# Patient Record
Sex: Female | Born: 1955
Health system: Southern US, Community
[De-identification: ages and names within clinical notes are randomized; demographics above are authoritative.]

## PROBLEM LIST (undated history)

## (undated) DIAGNOSIS — J189 Pneumonia, unspecified organism: Secondary | ICD-10-CM

## (undated) DIAGNOSIS — R059 Cough, unspecified: Secondary | ICD-10-CM

## (undated) DIAGNOSIS — I2510 Atherosclerotic heart disease of native coronary artery without angina pectoris: Secondary | ICD-10-CM

## (undated) DIAGNOSIS — I499 Cardiac arrhythmia, unspecified: Secondary | ICD-10-CM

## (undated) DIAGNOSIS — N390 Urinary tract infection, site not specified: Secondary | ICD-10-CM

## (undated) DIAGNOSIS — R030 Elevated blood-pressure reading, without diagnosis of hypertension: Secondary | ICD-10-CM

## (undated) DIAGNOSIS — I10 Essential (primary) hypertension: Secondary | ICD-10-CM

## (undated) DIAGNOSIS — M545 Low back pain, unspecified: Secondary | ICD-10-CM

## (undated) DIAGNOSIS — M5412 Radiculopathy, cervical region: Secondary | ICD-10-CM

## (undated) DIAGNOSIS — R569 Unspecified convulsions: Secondary | ICD-10-CM

## (undated) DIAGNOSIS — J45909 Unspecified asthma, uncomplicated: Secondary | ICD-10-CM

## (undated) DIAGNOSIS — I2119 ST elevation (STEMI) myocardial infarction involving other coronary artery of inferior wall: Secondary | ICD-10-CM

## (undated) DIAGNOSIS — I209 Angina pectoris, unspecified: Secondary | ICD-10-CM

## (undated) DIAGNOSIS — M359 Systemic involvement of connective tissue, unspecified: Secondary | ICD-10-CM

## (undated) DIAGNOSIS — K449 Diaphragmatic hernia without obstruction or gangrene: Secondary | ICD-10-CM

## (undated) DIAGNOSIS — J439 Emphysema, unspecified: Secondary | ICD-10-CM

## (undated) DIAGNOSIS — C3490 Malignant neoplasm of unspecified part of unspecified bronchus or lung: Secondary | ICD-10-CM

## (undated) DIAGNOSIS — K759 Inflammatory liver disease, unspecified: Secondary | ICD-10-CM

## (undated) DIAGNOSIS — E785 Hyperlipidemia, unspecified: Secondary | ICD-10-CM

## (undated) DIAGNOSIS — F3289 Other specified depressive episodes: Secondary | ICD-10-CM

## (undated) DIAGNOSIS — H269 Unspecified cataract: Secondary | ICD-10-CM

## (undated) DIAGNOSIS — K219 Gastro-esophageal reflux disease without esophagitis: Secondary | ICD-10-CM

## (undated) DIAGNOSIS — F329 Major depressive disorder, single episode, unspecified: Secondary | ICD-10-CM

## (undated) DIAGNOSIS — Z923 Personal history of irradiation: Secondary | ICD-10-CM

## (undated) DIAGNOSIS — A159 Respiratory tuberculosis unspecified: Secondary | ICD-10-CM

## (undated) DIAGNOSIS — M1990 Unspecified osteoarthritis, unspecified site: Secondary | ICD-10-CM

## (undated) DIAGNOSIS — L259 Unspecified contact dermatitis, unspecified cause: Secondary | ICD-10-CM

## (undated) DIAGNOSIS — J449 Chronic obstructive pulmonary disease, unspecified: Secondary | ICD-10-CM

## (undated) DIAGNOSIS — Z5111 Encounter for antineoplastic chemotherapy: Secondary | ICD-10-CM

## (undated) DIAGNOSIS — E559 Vitamin D deficiency, unspecified: Secondary | ICD-10-CM

## (undated) DIAGNOSIS — R05 Cough: Secondary | ICD-10-CM

## (undated) DIAGNOSIS — J4489 Other specified chronic obstructive pulmonary disease: Secondary | ICD-10-CM

## (undated) DIAGNOSIS — G4700 Insomnia, unspecified: Secondary | ICD-10-CM

## (undated) DIAGNOSIS — IMO0001 Reserved for inherently not codable concepts without codable children: Secondary | ICD-10-CM

## (undated) DIAGNOSIS — D239 Other benign neoplasm of skin, unspecified: Secondary | ICD-10-CM

## (undated) DIAGNOSIS — F411 Generalized anxiety disorder: Secondary | ICD-10-CM

## (undated) DIAGNOSIS — N189 Chronic kidney disease, unspecified: Secondary | ICD-10-CM

## (undated) DIAGNOSIS — G2581 Restless legs syndrome: Secondary | ICD-10-CM

## (undated) DIAGNOSIS — Z9861 Coronary angioplasty status: Secondary | ICD-10-CM

## (undated) DIAGNOSIS — Z720 Tobacco use: Secondary | ICD-10-CM

## (undated) DIAGNOSIS — I491 Atrial premature depolarization: Secondary | ICD-10-CM

## (undated) DIAGNOSIS — M25569 Pain in unspecified knee: Secondary | ICD-10-CM

## (undated) HISTORY — DX: Insomnia, unspecified: G47.00

## (undated) HISTORY — PX: TONSILLECTOMY: SUR1361

## (undated) HISTORY — DX: Restless legs syndrome: G25.81

## (undated) HISTORY — DX: Major depressive disorder, single episode, unspecified: F32.9

## (undated) HISTORY — DX: Malignant neoplasm of unspecified part of unspecified bronchus or lung: C34.90

## (undated) HISTORY — DX: Pain in unspecified knee: M25.569

## (undated) HISTORY — DX: Low back pain: M54.5

## (undated) HISTORY — DX: Generalized anxiety disorder: F41.1

## (undated) HISTORY — DX: Other benign neoplasm of skin, unspecified: D23.9

## (undated) HISTORY — PX: TUBAL LIGATION: SHX77

## (undated) HISTORY — PX: KNEE SURGERY: SHX244

## (undated) HISTORY — DX: Unspecified asthma, uncomplicated: J45.909

## (undated) HISTORY — DX: Unspecified osteoarthritis, unspecified site: M19.90

## (undated) HISTORY — DX: Encounter for antineoplastic chemotherapy: Z51.11

## (undated) HISTORY — DX: Urinary tract infection, site not specified: N39.0

## (undated) HISTORY — DX: Other specified chronic obstructive pulmonary disease: J44.89

## (undated) HISTORY — DX: Other specified depressive episodes: F32.89

## (undated) HISTORY — DX: Low back pain, unspecified: M54.50

## (undated) HISTORY — PX: COLONOSCOPY: SHX174

## (undated) HISTORY — DX: Unspecified cataract: H26.9

## (undated) HISTORY — DX: Elevated blood-pressure reading, without diagnosis of hypertension: R03.0

## (undated) HISTORY — DX: Gastro-esophageal reflux disease without esophagitis: K21.9

## (undated) HISTORY — DX: Unspecified contact dermatitis, unspecified cause: L25.9

## (undated) HISTORY — DX: Vitamin D deficiency, unspecified: E55.9

## (undated) HISTORY — PX: BREAST BIOPSY: SHX20

## (undated) HISTORY — DX: Hyperlipidemia, unspecified: E78.5

## (undated) HISTORY — PX: POLYPECTOMY: SHX149

## (undated) HISTORY — DX: Atherosclerotic heart disease of native coronary artery without angina pectoris: I25.10

## (undated) HISTORY — DX: Diaphragmatic hernia without obstruction or gangrene: K44.9

## (undated) HISTORY — DX: Radiculopathy, cervical region: M54.12

## (undated) HISTORY — DX: Chronic obstructive pulmonary disease, unspecified: J44.9

## (undated) HISTORY — DX: Unspecified convulsions: R56.9

## (undated) HISTORY — DX: Atherosclerotic heart disease of native coronary artery without angina pectoris: Z98.61

## (undated) HISTORY — DX: Atrial premature depolarization: I49.1

## (undated) HISTORY — DX: Emphysema, unspecified: J43.9

---
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
## 1997-09-20 ENCOUNTER — Ambulatory Visit: Admission: RE | Admit: 1997-09-20 | Discharge: 1997-09-20 | Payer: Self-pay | Admitting: Internal Medicine

## 1997-09-26 ENCOUNTER — Encounter: Admission: RE | Admit: 1997-09-26 | Discharge: 1997-09-26 | Payer: Self-pay | Admitting: Internal Medicine

## 1997-11-11 ENCOUNTER — Other Ambulatory Visit: Admission: RE | Admit: 1997-11-11 | Discharge: 1997-11-11 | Payer: Self-pay | Admitting: *Deleted

## 1997-11-11 ENCOUNTER — Encounter: Admission: RE | Admit: 1997-11-11 | Discharge: 1997-11-11 | Payer: Self-pay | Admitting: Internal Medicine

## 1997-11-20 ENCOUNTER — Emergency Department (HOSPITAL_COMMUNITY): Admission: EM | Admit: 1997-11-20 | Discharge: 1997-11-20 | Payer: Self-pay | Admitting: Emergency Medicine

## 1998-05-05 ENCOUNTER — Encounter: Payer: Self-pay | Admitting: Emergency Medicine

## 1998-05-05 ENCOUNTER — Emergency Department (HOSPITAL_COMMUNITY): Admission: EM | Admit: 1998-05-05 | Discharge: 1998-05-05 | Payer: Self-pay | Admitting: Emergency Medicine

## 1998-05-12 ENCOUNTER — Encounter: Admission: RE | Admit: 1998-05-12 | Discharge: 1998-05-12 | Payer: Self-pay | Admitting: Internal Medicine

## 1998-06-14 ENCOUNTER — Encounter: Admission: RE | Admit: 1998-06-14 | Discharge: 1998-06-14 | Payer: Self-pay | Admitting: Internal Medicine

## 1998-06-22 ENCOUNTER — Ambulatory Visit (HOSPITAL_COMMUNITY): Admission: RE | Admit: 1998-06-22 | Discharge: 1998-06-22 | Payer: Self-pay | Admitting: *Deleted

## 1998-09-29 ENCOUNTER — Encounter: Admission: RE | Admit: 1998-09-29 | Discharge: 1998-09-29 | Payer: Self-pay | Admitting: Internal Medicine

## 1998-10-01 ENCOUNTER — Emergency Department (HOSPITAL_COMMUNITY): Admission: EM | Admit: 1998-10-01 | Discharge: 1998-10-01 | Payer: Self-pay | Admitting: Emergency Medicine

## 1998-10-23 ENCOUNTER — Encounter: Admission: RE | Admit: 1998-10-23 | Discharge: 1998-10-23 | Payer: Self-pay | Admitting: Internal Medicine

## 1999-01-02 ENCOUNTER — Emergency Department (HOSPITAL_COMMUNITY): Admission: EM | Admit: 1999-01-02 | Discharge: 1999-01-02 | Payer: Self-pay | Admitting: Emergency Medicine

## 1999-02-06 ENCOUNTER — Emergency Department (HOSPITAL_COMMUNITY): Admission: EM | Admit: 1999-02-06 | Discharge: 1999-02-06 | Payer: Self-pay | Admitting: *Deleted

## 1999-04-05 ENCOUNTER — Emergency Department (HOSPITAL_COMMUNITY): Admission: EM | Admit: 1999-04-05 | Discharge: 1999-04-05 | Payer: Self-pay | Admitting: Emergency Medicine

## 1999-10-14 ENCOUNTER — Emergency Department (HOSPITAL_COMMUNITY): Admission: EM | Admit: 1999-10-14 | Discharge: 1999-10-14 | Payer: Self-pay | Admitting: *Deleted

## 1999-10-18 ENCOUNTER — Encounter: Admission: RE | Admit: 1999-10-18 | Discharge: 1999-10-18 | Payer: Self-pay | Admitting: Internal Medicine

## 1999-11-08 ENCOUNTER — Encounter: Admission: RE | Admit: 1999-11-08 | Discharge: 1999-11-08 | Payer: Self-pay | Admitting: Internal Medicine

## 2000-01-27 ENCOUNTER — Encounter: Payer: Self-pay | Admitting: Neurosurgery

## 2000-01-27 ENCOUNTER — Ambulatory Visit (HOSPITAL_COMMUNITY): Admission: RE | Admit: 2000-01-27 | Discharge: 2000-01-27 | Payer: Self-pay | Admitting: Neurosurgery

## 2000-06-11 ENCOUNTER — Encounter: Admission: RE | Admit: 2000-06-11 | Discharge: 2000-06-11 | Payer: Self-pay | Admitting: Hematology and Oncology

## 2000-07-31 ENCOUNTER — Encounter: Admission: RE | Admit: 2000-07-31 | Discharge: 2000-07-31 | Payer: Self-pay | Admitting: Hematology and Oncology

## 2000-08-07 ENCOUNTER — Encounter: Admission: RE | Admit: 2000-08-07 | Discharge: 2000-08-07 | Payer: Self-pay | Admitting: Hematology and Oncology

## 2000-12-12 ENCOUNTER — Encounter: Payer: Self-pay | Admitting: Emergency Medicine

## 2000-12-12 ENCOUNTER — Emergency Department (HOSPITAL_COMMUNITY): Admission: EM | Admit: 2000-12-12 | Discharge: 2000-12-12 | Payer: Self-pay | Admitting: Emergency Medicine

## 2001-01-29 ENCOUNTER — Encounter: Admission: RE | Admit: 2001-01-29 | Discharge: 2001-01-29 | Payer: Self-pay | Admitting: Internal Medicine

## 2001-03-30 ENCOUNTER — Encounter: Admission: RE | Admit: 2001-03-30 | Discharge: 2001-03-30 | Payer: Self-pay | Admitting: Internal Medicine

## 2001-04-18 ENCOUNTER — Encounter: Payer: Self-pay | Admitting: Neurosurgery

## 2001-04-18 ENCOUNTER — Ambulatory Visit (HOSPITAL_COMMUNITY): Admission: RE | Admit: 2001-04-18 | Discharge: 2001-04-18 | Payer: Self-pay | Admitting: Neurosurgery

## 2001-05-12 ENCOUNTER — Other Ambulatory Visit: Admission: RE | Admit: 2001-05-12 | Discharge: 2001-05-12 | Payer: Self-pay | Admitting: Obstetrics & Gynecology

## 2001-05-12 ENCOUNTER — Encounter: Admission: RE | Admit: 2001-05-12 | Discharge: 2001-05-12 | Payer: Self-pay | Admitting: Obstetrics & Gynecology

## 2001-05-13 ENCOUNTER — Other Ambulatory Visit: Admission: RE | Admit: 2001-05-13 | Discharge: 2001-05-13 | Payer: Self-pay | Admitting: Obstetrics & Gynecology

## 2001-05-22 ENCOUNTER — Inpatient Hospital Stay (HOSPITAL_COMMUNITY): Admission: AD | Admit: 2001-05-22 | Discharge: 2001-05-22 | Payer: Self-pay | Admitting: Obstetrics & Gynecology

## 2001-05-22 ENCOUNTER — Ambulatory Visit (HOSPITAL_COMMUNITY): Admission: RE | Admit: 2001-05-22 | Discharge: 2001-05-22 | Payer: Self-pay | Admitting: Obstetrics

## 2001-05-22 ENCOUNTER — Encounter: Payer: Self-pay | Admitting: Obstetrics

## 2001-06-07 ENCOUNTER — Emergency Department (HOSPITAL_COMMUNITY): Admission: EM | Admit: 2001-06-07 | Discharge: 2001-06-07 | Payer: Self-pay | Admitting: Emergency Medicine

## 2001-06-11 ENCOUNTER — Emergency Department (HOSPITAL_COMMUNITY): Admission: EM | Admit: 2001-06-11 | Discharge: 2001-06-11 | Payer: Self-pay | Admitting: Emergency Medicine

## 2001-06-17 ENCOUNTER — Encounter: Payer: Self-pay | Admitting: Emergency Medicine

## 2001-06-17 ENCOUNTER — Emergency Department (HOSPITAL_COMMUNITY): Admission: EM | Admit: 2001-06-17 | Discharge: 2001-06-17 | Payer: Self-pay | Admitting: Emergency Medicine

## 2001-07-29 ENCOUNTER — Encounter: Admission: RE | Admit: 2001-07-29 | Discharge: 2001-09-24 | Payer: Self-pay | Admitting: Neurosurgery

## 2001-08-19 ENCOUNTER — Encounter: Admission: RE | Admit: 2001-08-19 | Discharge: 2001-08-19 | Payer: Self-pay | Admitting: *Deleted

## 2001-09-19 ENCOUNTER — Encounter: Payer: Self-pay | Admitting: Emergency Medicine

## 2001-09-19 ENCOUNTER — Emergency Department (HOSPITAL_COMMUNITY): Admission: EM | Admit: 2001-09-19 | Discharge: 2001-09-19 | Payer: Self-pay | Admitting: Emergency Medicine

## 2001-12-03 ENCOUNTER — Encounter: Admission: RE | Admit: 2001-12-03 | Discharge: 2001-12-03 | Payer: Self-pay | Admitting: Internal Medicine

## 2002-03-07 ENCOUNTER — Emergency Department (HOSPITAL_COMMUNITY): Admission: EM | Admit: 2002-03-07 | Discharge: 2002-03-07 | Payer: Self-pay | Admitting: Emergency Medicine

## 2002-03-07 ENCOUNTER — Encounter: Payer: Self-pay | Admitting: Emergency Medicine

## 2002-05-01 ENCOUNTER — Emergency Department (HOSPITAL_COMMUNITY): Admission: EM | Admit: 2002-05-01 | Discharge: 2002-05-01 | Payer: Self-pay | Admitting: Emergency Medicine

## 2002-05-12 ENCOUNTER — Encounter: Admission: RE | Admit: 2002-05-12 | Discharge: 2002-05-12 | Payer: Self-pay | Admitting: Internal Medicine

## 2002-06-16 ENCOUNTER — Encounter (INDEPENDENT_AMBULATORY_CARE_PROVIDER_SITE_OTHER): Payer: Self-pay | Admitting: *Deleted

## 2002-06-16 ENCOUNTER — Other Ambulatory Visit: Admission: RE | Admit: 2002-06-16 | Discharge: 2002-06-16 | Payer: Self-pay | Admitting: Internal Medicine

## 2002-06-16 ENCOUNTER — Encounter: Admission: RE | Admit: 2002-06-16 | Discharge: 2002-06-16 | Payer: Self-pay | Admitting: Internal Medicine

## 2002-06-18 ENCOUNTER — Ambulatory Visit (HOSPITAL_COMMUNITY): Admission: RE | Admit: 2002-06-18 | Discharge: 2002-06-18 | Payer: Self-pay

## 2002-06-25 ENCOUNTER — Encounter: Admission: RE | Admit: 2002-06-25 | Discharge: 2002-06-25 | Payer: Self-pay | Admitting: Sports Medicine

## 2002-06-25 ENCOUNTER — Encounter (INDEPENDENT_AMBULATORY_CARE_PROVIDER_SITE_OTHER): Payer: Self-pay | Admitting: Specialist

## 2002-06-25 ENCOUNTER — Encounter: Payer: Self-pay | Admitting: Sports Medicine

## 2002-09-24 ENCOUNTER — Encounter: Admission: RE | Admit: 2002-09-24 | Discharge: 2002-09-24 | Payer: Self-pay | Admitting: Internal Medicine

## 2002-11-01 ENCOUNTER — Encounter: Admission: RE | Admit: 2002-11-01 | Discharge: 2002-11-01 | Payer: Self-pay | Admitting: Internal Medicine

## 2003-02-10 ENCOUNTER — Emergency Department (HOSPITAL_COMMUNITY): Admission: EM | Admit: 2003-02-10 | Discharge: 2003-02-11 | Payer: Self-pay

## 2003-04-06 ENCOUNTER — Encounter: Admission: RE | Admit: 2003-04-06 | Discharge: 2003-04-06 | Payer: Self-pay | Admitting: Internal Medicine

## 2003-08-18 ENCOUNTER — Encounter: Admission: RE | Admit: 2003-08-18 | Discharge: 2003-08-18 | Payer: Self-pay | Admitting: Internal Medicine

## 2003-08-19 ENCOUNTER — Encounter: Admission: RE | Admit: 2003-08-19 | Discharge: 2003-08-19 | Payer: Self-pay | Admitting: Internal Medicine

## 2003-09-13 ENCOUNTER — Emergency Department (HOSPITAL_COMMUNITY): Admission: EM | Admit: 2003-09-13 | Discharge: 2003-09-13 | Payer: Self-pay | Admitting: Family Medicine

## 2004-01-18 ENCOUNTER — Encounter: Admission: RE | Admit: 2004-01-18 | Discharge: 2004-01-18 | Payer: Self-pay | Admitting: Internal Medicine

## 2004-01-23 ENCOUNTER — Ambulatory Visit (HOSPITAL_COMMUNITY): Admission: RE | Admit: 2004-01-23 | Discharge: 2004-01-23 | Payer: Self-pay | Admitting: Internal Medicine

## 2004-01-25 ENCOUNTER — Encounter: Admission: RE | Admit: 2004-01-25 | Discharge: 2004-01-25 | Payer: Self-pay | Admitting: Internal Medicine

## 2004-03-05 ENCOUNTER — Emergency Department (HOSPITAL_COMMUNITY): Admission: EM | Admit: 2004-03-05 | Discharge: 2004-03-05 | Payer: Self-pay | Admitting: Family Medicine

## 2004-03-10 ENCOUNTER — Emergency Department (HOSPITAL_COMMUNITY): Admission: EM | Admit: 2004-03-10 | Discharge: 2004-03-10 | Payer: Self-pay | Admitting: Emergency Medicine

## 2004-04-09 ENCOUNTER — Ambulatory Visit: Payer: Self-pay | Admitting: Internal Medicine

## 2004-05-03 ENCOUNTER — Emergency Department (HOSPITAL_COMMUNITY): Admission: EM | Admit: 2004-05-03 | Discharge: 2004-05-03 | Payer: Self-pay | Admitting: Family Medicine

## 2004-05-12 ENCOUNTER — Emergency Department (HOSPITAL_COMMUNITY): Admission: EM | Admit: 2004-05-12 | Discharge: 2004-05-12 | Payer: Self-pay | Admitting: Family Medicine

## 2004-08-30 ENCOUNTER — Ambulatory Visit: Payer: Self-pay | Admitting: Internal Medicine

## 2004-11-05 ENCOUNTER — Ambulatory Visit: Payer: Self-pay | Admitting: Internal Medicine

## 2004-12-12 ENCOUNTER — Ambulatory Visit: Payer: Self-pay | Admitting: Internal Medicine

## 2005-01-07 ENCOUNTER — Emergency Department (HOSPITAL_COMMUNITY): Admission: EM | Admit: 2005-01-07 | Discharge: 2005-01-07 | Payer: Self-pay | Admitting: Family Medicine

## 2005-02-28 ENCOUNTER — Encounter (INDEPENDENT_AMBULATORY_CARE_PROVIDER_SITE_OTHER): Payer: Self-pay | Admitting: Internal Medicine

## 2005-02-28 ENCOUNTER — Ambulatory Visit: Payer: Self-pay | Admitting: Internal Medicine

## 2005-02-28 LAB — CONVERTED CEMR LAB: Pap Smear: ABNORMAL

## 2005-03-11 ENCOUNTER — Emergency Department (HOSPITAL_COMMUNITY): Admission: EM | Admit: 2005-03-11 | Discharge: 2005-03-11 | Payer: Self-pay | Admitting: Emergency Medicine

## 2005-03-11 ENCOUNTER — Ambulatory Visit: Payer: Self-pay | Admitting: Hospitalist

## 2005-03-21 ENCOUNTER — Encounter: Admission: RE | Admit: 2005-03-21 | Discharge: 2005-03-21 | Payer: Self-pay | Admitting: Internal Medicine

## 2005-04-04 ENCOUNTER — Ambulatory Visit (HOSPITAL_COMMUNITY): Admission: RE | Admit: 2005-04-04 | Discharge: 2005-04-04 | Payer: Self-pay | Admitting: Internal Medicine

## 2005-04-04 ENCOUNTER — Ambulatory Visit: Payer: Self-pay | Admitting: Internal Medicine

## 2005-04-15 ENCOUNTER — Ambulatory Visit (HOSPITAL_COMMUNITY): Admission: RE | Admit: 2005-04-15 | Discharge: 2005-04-15 | Payer: Self-pay | Admitting: Internal Medicine

## 2005-06-22 ENCOUNTER — Inpatient Hospital Stay (HOSPITAL_COMMUNITY): Admission: EM | Admit: 2005-06-22 | Discharge: 2005-06-23 | Payer: Self-pay | Admitting: Emergency Medicine

## 2005-07-01 ENCOUNTER — Ambulatory Visit: Payer: Self-pay | Admitting: Internal Medicine

## 2005-08-16 ENCOUNTER — Ambulatory Visit: Payer: Self-pay

## 2005-09-05 ENCOUNTER — Ambulatory Visit: Payer: Self-pay | Admitting: Internal Medicine

## 2005-09-05 ENCOUNTER — Ambulatory Visit (HOSPITAL_COMMUNITY): Admission: RE | Admit: 2005-09-05 | Discharge: 2005-09-05 | Payer: Self-pay | Admitting: Hospitalist

## 2005-10-01 ENCOUNTER — Encounter: Admission: RE | Admit: 2005-10-01 | Discharge: 2005-12-30 | Payer: Self-pay | Admitting: Podiatry

## 2005-10-03 ENCOUNTER — Ambulatory Visit: Payer: Self-pay | Admitting: Internal Medicine

## 2005-10-07 ENCOUNTER — Emergency Department (HOSPITAL_COMMUNITY): Admission: EM | Admit: 2005-10-07 | Discharge: 2005-10-07 | Payer: Self-pay | Admitting: Family Medicine

## 2005-10-17 ENCOUNTER — Ambulatory Visit (HOSPITAL_COMMUNITY): Admission: RE | Admit: 2005-10-17 | Discharge: 2005-10-17 | Payer: Self-pay | Admitting: Internal Medicine

## 2005-10-17 ENCOUNTER — Ambulatory Visit: Payer: Self-pay | Admitting: Internal Medicine

## 2005-10-29 ENCOUNTER — Ambulatory Visit: Payer: Self-pay | Admitting: Internal Medicine

## 2005-11-20 ENCOUNTER — Emergency Department (HOSPITAL_COMMUNITY): Admission: EM | Admit: 2005-11-20 | Discharge: 2005-11-20 | Payer: Self-pay | Admitting: Family Medicine

## 2005-12-17 ENCOUNTER — Ambulatory Visit: Payer: Self-pay | Admitting: Internal Medicine

## 2006-01-08 ENCOUNTER — Ambulatory Visit: Payer: Self-pay | Admitting: Internal Medicine

## 2006-01-31 ENCOUNTER — Ambulatory Visit: Payer: Self-pay | Admitting: Internal Medicine

## 2006-02-01 ENCOUNTER — Ambulatory Visit (HOSPITAL_COMMUNITY): Admission: RE | Admit: 2006-02-01 | Discharge: 2006-02-01 | Payer: Self-pay | Admitting: Internal Medicine

## 2006-02-18 ENCOUNTER — Ambulatory Visit: Payer: Self-pay | Admitting: Hospitalist

## 2006-02-25 ENCOUNTER — Ambulatory Visit: Payer: Self-pay | Admitting: Internal Medicine

## 2006-02-25 LAB — CONVERTED CEMR LAB: Pap Smear: NEGATIVE

## 2006-03-12 ENCOUNTER — Emergency Department (HOSPITAL_COMMUNITY): Admission: EM | Admit: 2006-03-12 | Discharge: 2006-03-12 | Payer: Self-pay | Admitting: Emergency Medicine

## 2006-05-22 IMAGING — US US ABDOMEN COMPLETE
1 series · 14 of 25 positions shown · non-contrast
Comparison: none

CLINICAL DATA: Abdominal pain. 
 ABDOMINAL ULTRASOUND 
 Numerous images made in the longitudinal and transverse direction reveal the gallbladder to be normal in size with a wall thickness of 1.6 mm.  No stones are noted.  The common bile duct measures 3.6 mm without evidence of stones.  No abnormality is noted of the liver, inferior vena cava or pancreas.  The spleen is though to be normal in size.   The right kidney measures 11.2 cm and the left 11.6 without evidence of hydronephrosis or stone.  The abdominal aorta is within normal limits measuring 2.1 cm in maximum diameter. 
 IMPRESSION
 No abnormality noted on abdominal ultrasound exam.  Specifically, no evidence of gallstones is suggested.

[Series 1: unknown · 0.30mm/px · 14 of 54 slices shown]
[im 1/54]
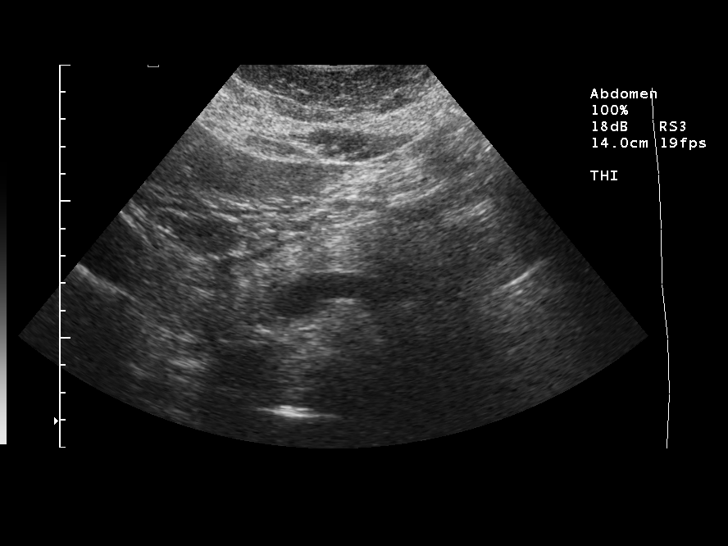
[im 5/54]
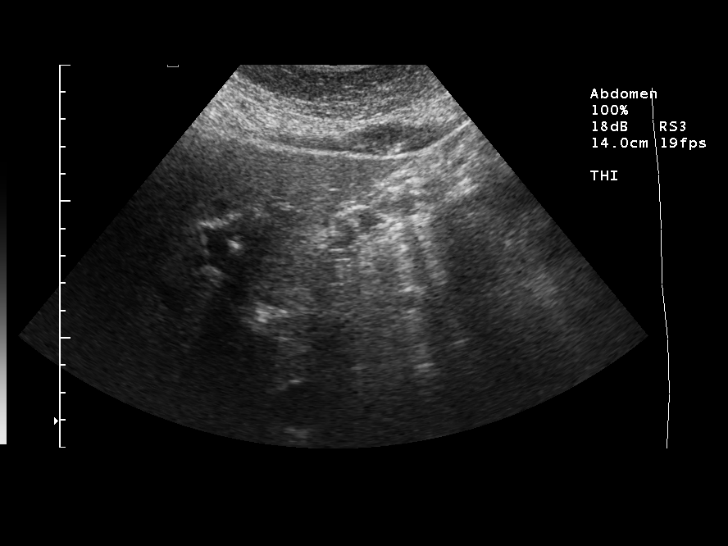
[im 9/54]
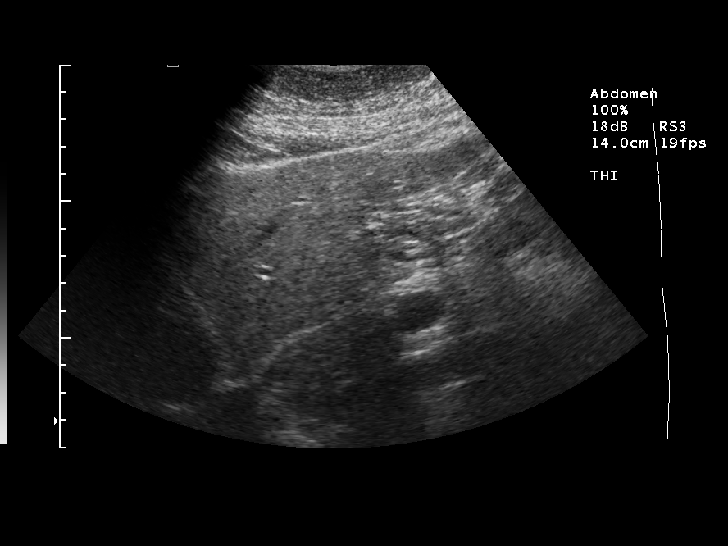
[im 14/54]
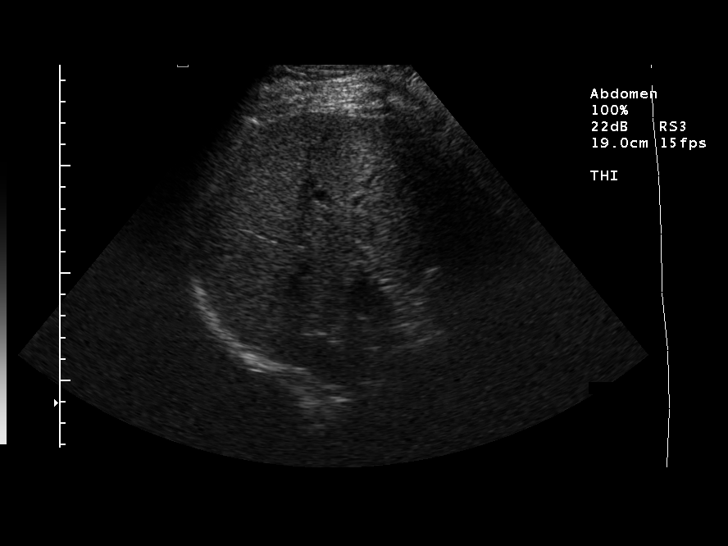
[im 18/54]
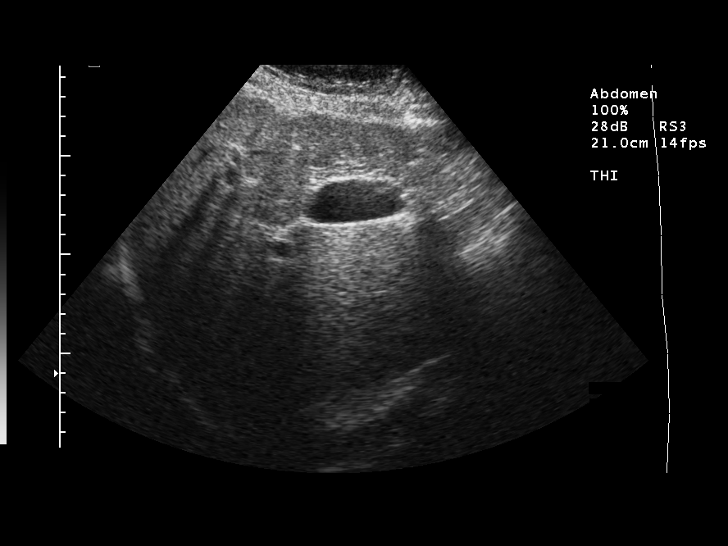
[im 20/54]
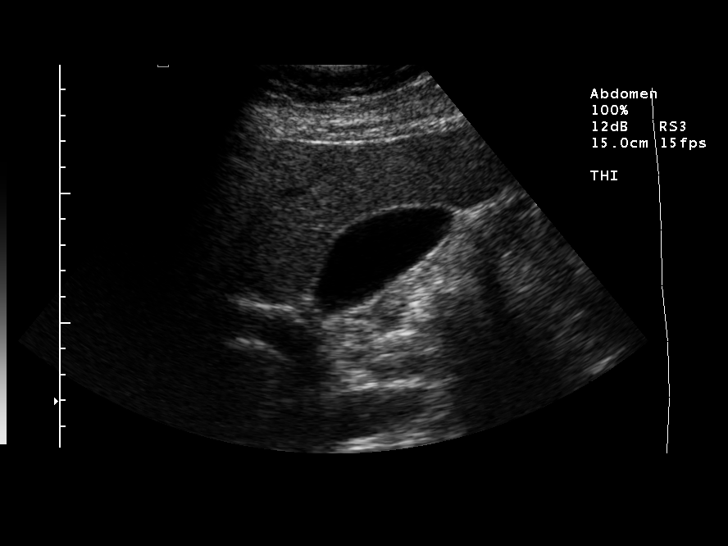
[im 25/54]
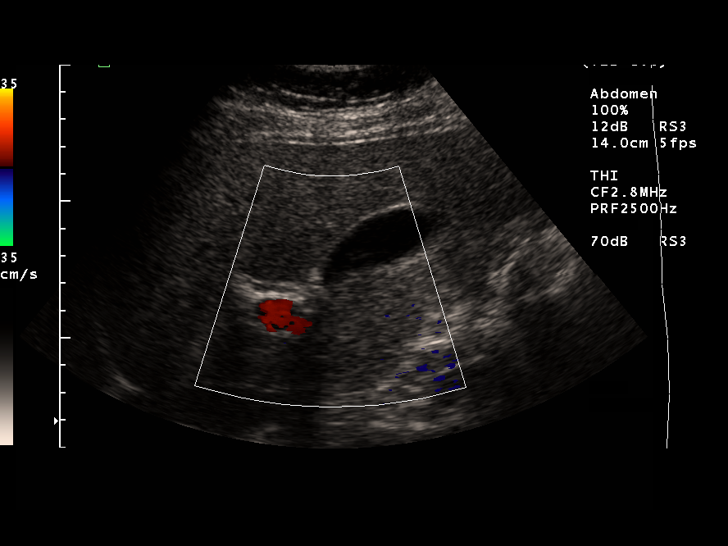
[im 29/54]
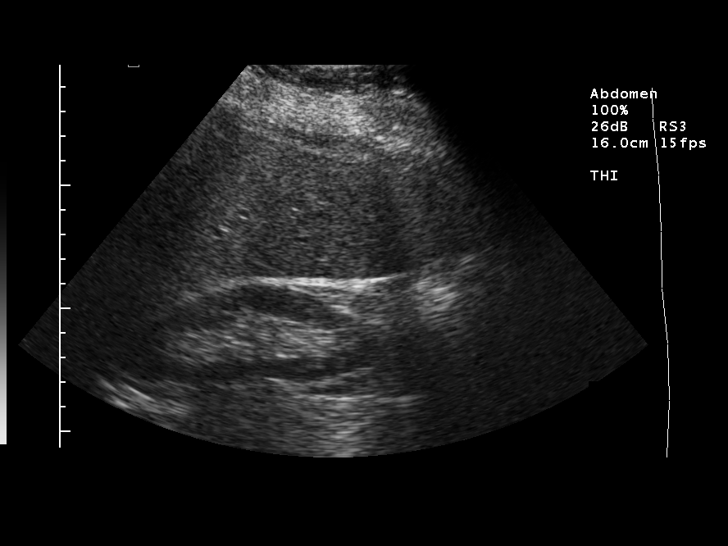
[im 34/54]
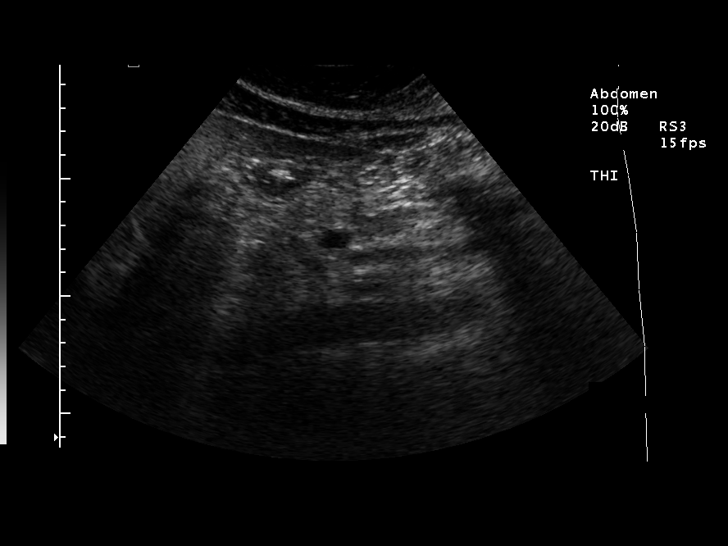
[im 36/54]
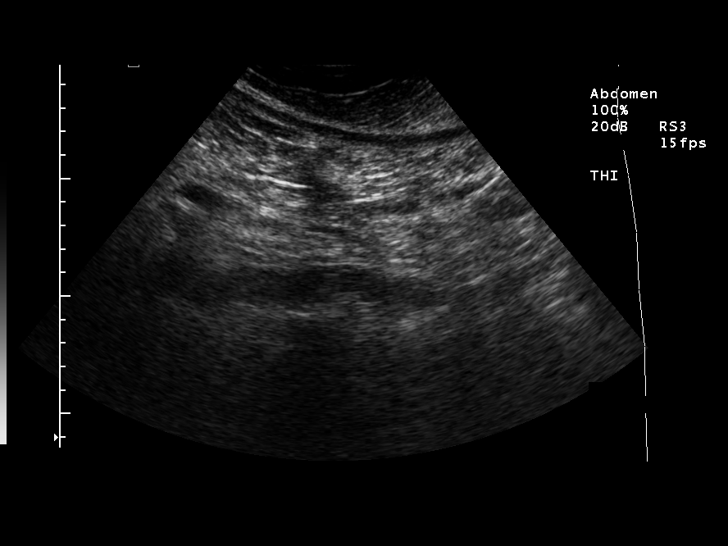
[im 40/54]
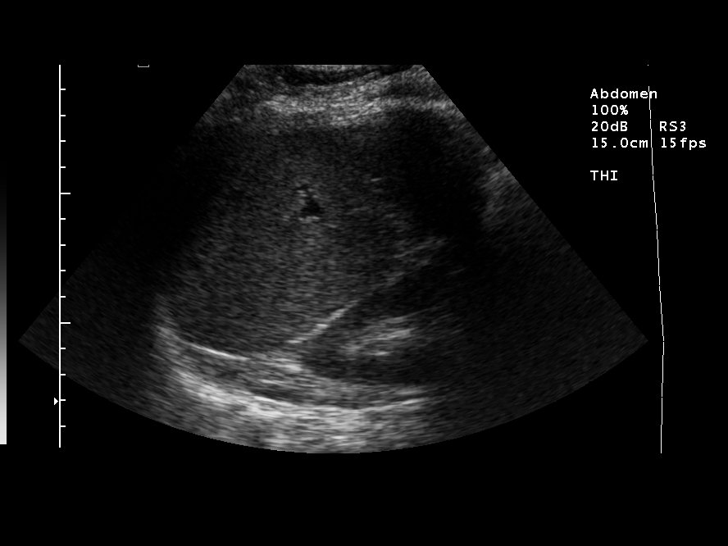
[im 45/54]
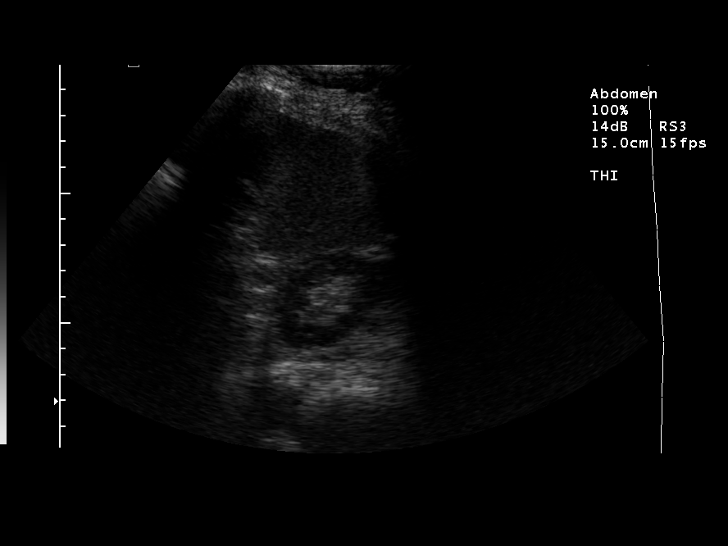
[im 49/54]
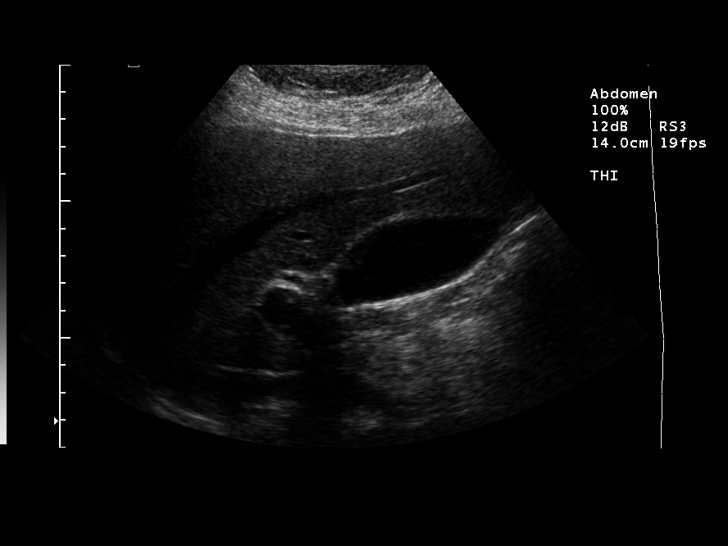
[im 54/54]
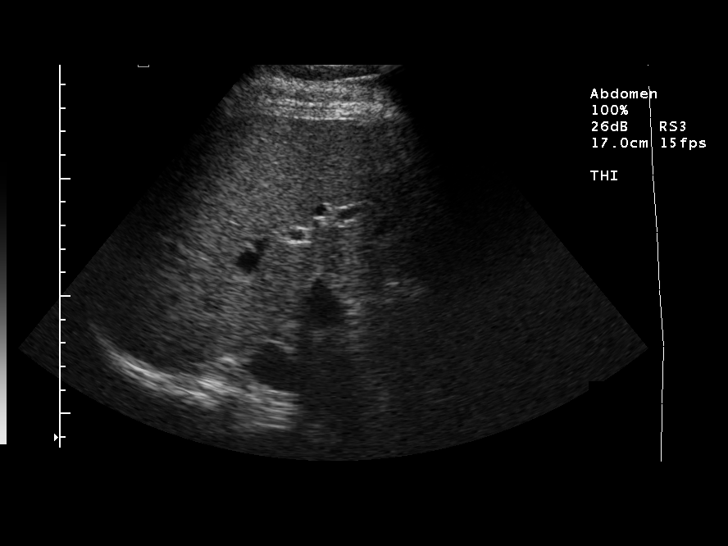

[14 of 25 positions shown; findings below may reference images not displayed]

## 2006-05-23 ENCOUNTER — Encounter (INDEPENDENT_AMBULATORY_CARE_PROVIDER_SITE_OTHER): Payer: Self-pay | Admitting: *Deleted

## 2006-05-23 ENCOUNTER — Ambulatory Visit (HOSPITAL_COMMUNITY): Admission: RE | Admit: 2006-05-23 | Discharge: 2006-05-23 | Payer: Self-pay | Admitting: Internal Medicine

## 2006-05-23 ENCOUNTER — Ambulatory Visit: Payer: Self-pay | Admitting: Internal Medicine

## 2006-05-23 LAB — CONVERTED CEMR LAB
ALT: 18 units/L (ref 0–35)
AST: 19 units/L (ref 0–37)
Albumin: 4.1 g/dL (ref 3.5–5.2)
Alkaline Phosphatase: 70 units/L (ref 39–117)
BUN: 7 mg/dL (ref 6–23)
CK-MB: 0.6 ng/mL (ref 0.3–4.0)
CO2: 27 meq/L (ref 19–32)
Calcium: 9.4 mg/dL (ref 8.4–10.5)
Chloride: 104 meq/L (ref 96–112)
Creatinine, Ser: 0.7 mg/dL (ref 0.40–1.20)
Glucose, Bld: 88 mg/dL (ref 70–99)
HCT: 41.6 % (ref 34.4–43.3)
Hemoglobin: 14.2 g/dL (ref 11.7–14.8)
Lipase: 31 units/L (ref 11–59)
MCHC: 34.2 g/dL (ref 33.1–35.4)
MCV: 86.7 fL (ref 78.8–100.0)
Platelets: 232 10*3/uL (ref 152–374)
Potassium: 4.3 meq/L (ref 3.5–5.3)
RBC: 4.8 M/uL (ref 3.79–4.96)
RDW: 13.2 % (ref 11.5–15.3)
Sodium: 138 meq/L (ref 135–145)
Total Bilirubin: 0.3 mg/dL (ref 0.3–1.2)
Total CK: 24 units/L (ref 7–177)
Total Protein: 6.7 g/dL (ref 6.0–8.3)
Troponin I: 0.01 ng/mL (ref <0.06)
WBC: 7.1 10*3/uL (ref 3.7–10.0)

## 2006-05-26 ENCOUNTER — Ambulatory Visit (HOSPITAL_COMMUNITY): Admission: RE | Admit: 2006-05-26 | Discharge: 2006-05-26 | Payer: Self-pay | Admitting: *Deleted

## 2006-06-10 ENCOUNTER — Ambulatory Visit: Payer: Self-pay | Admitting: Gastroenterology

## 2006-06-11 ENCOUNTER — Encounter (INDEPENDENT_AMBULATORY_CARE_PROVIDER_SITE_OTHER): Payer: Self-pay | Admitting: Internal Medicine

## 2006-06-11 DIAGNOSIS — M25569 Pain in unspecified knee: Secondary | ICD-10-CM | POA: Insufficient documentation

## 2006-06-11 DIAGNOSIS — Z8719 Personal history of other diseases of the digestive system: Secondary | ICD-10-CM | POA: Insufficient documentation

## 2006-06-11 DIAGNOSIS — M545 Low back pain, unspecified: Secondary | ICD-10-CM | POA: Insufficient documentation

## 2006-06-11 DIAGNOSIS — F329 Major depressive disorder, single episode, unspecified: Secondary | ICD-10-CM

## 2006-06-11 DIAGNOSIS — K219 Gastro-esophageal reflux disease without esophagitis: Secondary | ICD-10-CM | POA: Insufficient documentation

## 2006-06-11 DIAGNOSIS — F419 Anxiety disorder, unspecified: Secondary | ICD-10-CM | POA: Insufficient documentation

## 2006-06-11 DIAGNOSIS — F32A Depression, unspecified: Secondary | ICD-10-CM | POA: Insufficient documentation

## 2006-06-11 DIAGNOSIS — D239 Other benign neoplasm of skin, unspecified: Secondary | ICD-10-CM | POA: Insufficient documentation

## 2006-06-11 DIAGNOSIS — R569 Unspecified convulsions: Secondary | ICD-10-CM | POA: Insufficient documentation

## 2006-06-11 DIAGNOSIS — G2581 Restless legs syndrome: Secondary | ICD-10-CM | POA: Insufficient documentation

## 2006-06-11 DIAGNOSIS — M79609 Pain in unspecified limb: Secondary | ICD-10-CM | POA: Insufficient documentation

## 2006-06-11 DIAGNOSIS — F411 Generalized anxiety disorder: Secondary | ICD-10-CM | POA: Insufficient documentation

## 2006-06-11 DIAGNOSIS — M5412 Radiculopathy, cervical region: Secondary | ICD-10-CM | POA: Insufficient documentation

## 2006-06-17 ENCOUNTER — Telehealth (INDEPENDENT_AMBULATORY_CARE_PROVIDER_SITE_OTHER): Payer: Self-pay | Admitting: *Deleted

## 2006-07-08 ENCOUNTER — Encounter (INDEPENDENT_AMBULATORY_CARE_PROVIDER_SITE_OTHER): Payer: Self-pay | Admitting: Unknown Physician Specialty

## 2006-07-08 ENCOUNTER — Ambulatory Visit: Payer: Self-pay | Admitting: Gastroenterology

## 2006-07-08 LAB — HM COLONOSCOPY

## 2006-07-08 IMAGING — CR DG LUMBAR SPINE COMPLETE 4+V
5 series · 5 of 5 positions shown · non-contrast
Comparison: none

CLINICAL DATA: Low back pain.

LUMBAR SPINE - 4 VIEW

[view not recorded (1 of 5)]
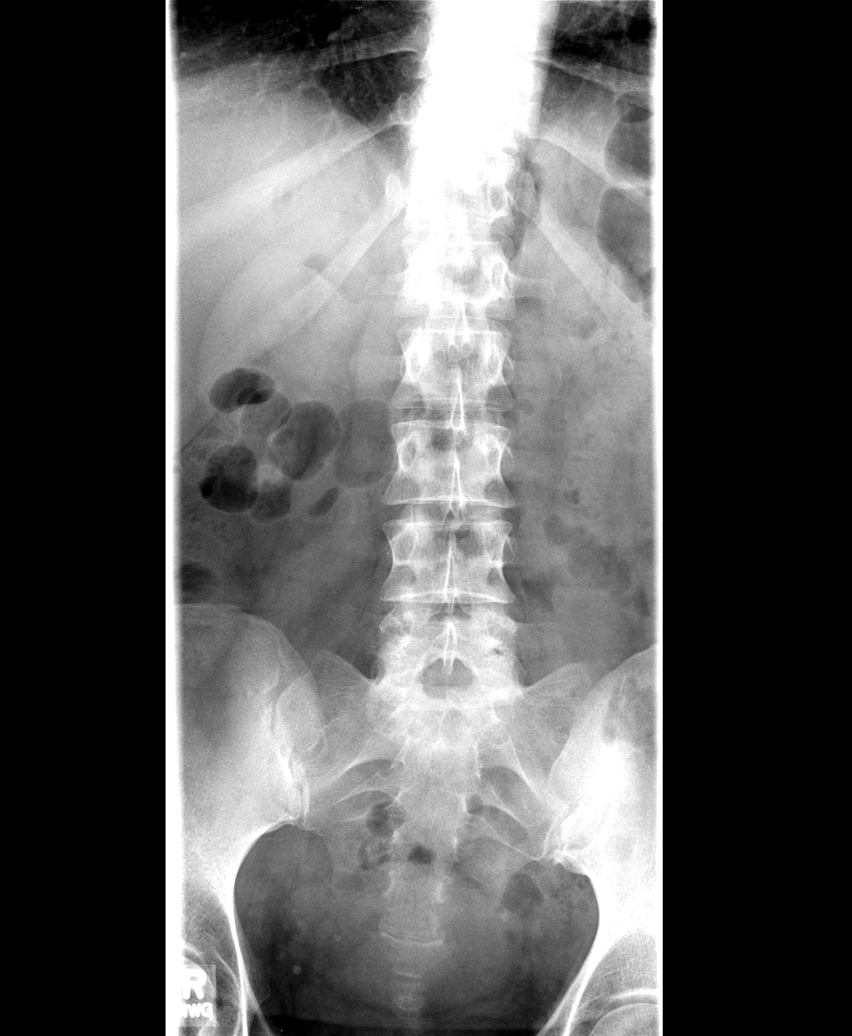

[view not recorded (2 of 5)]
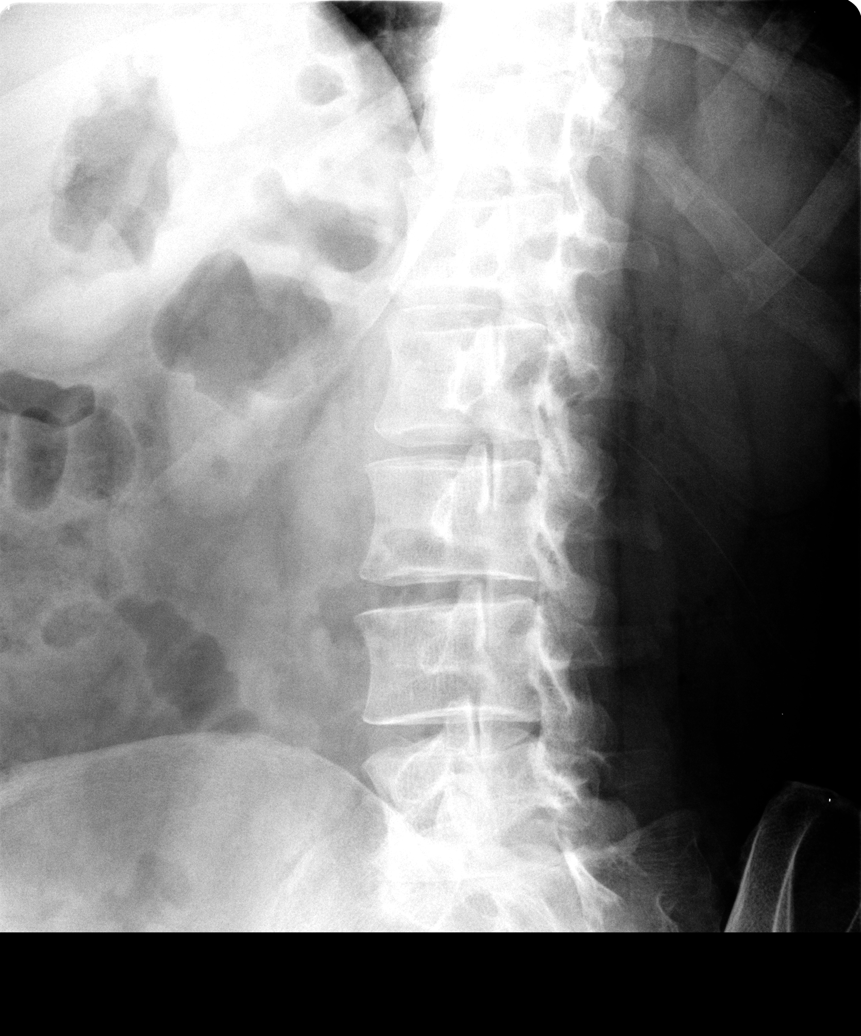

[view not recorded (3 of 5)]
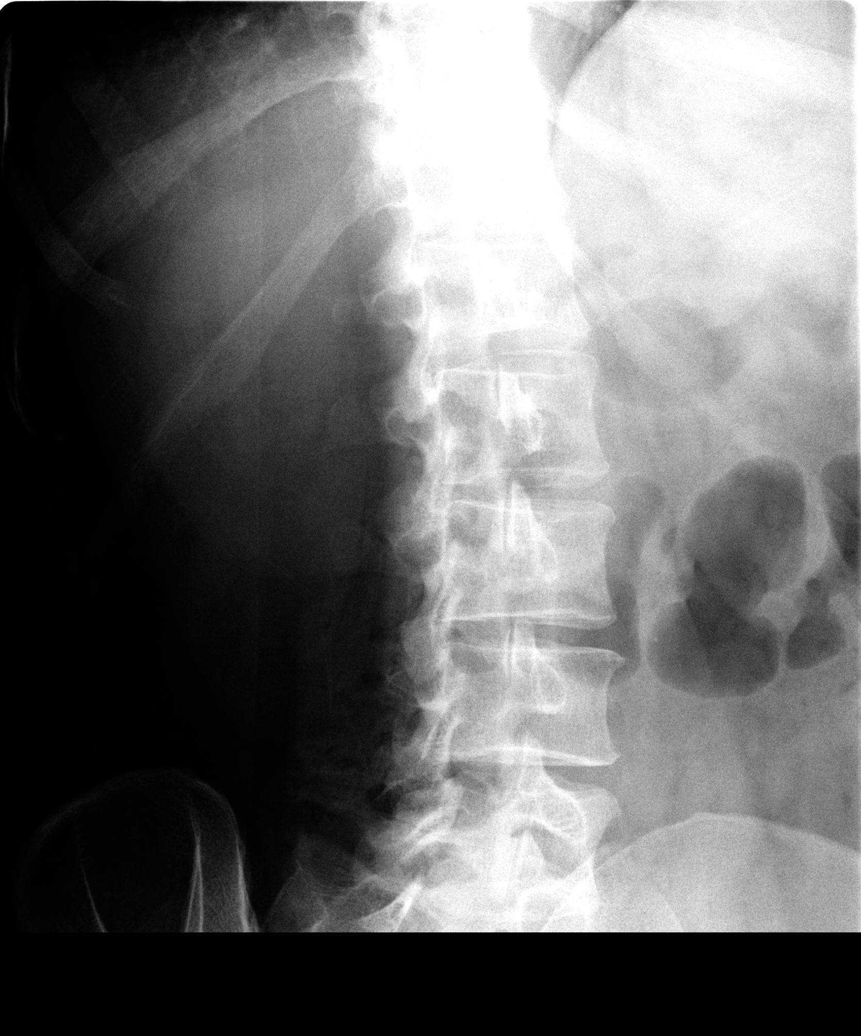

[view not recorded (4 of 5)]
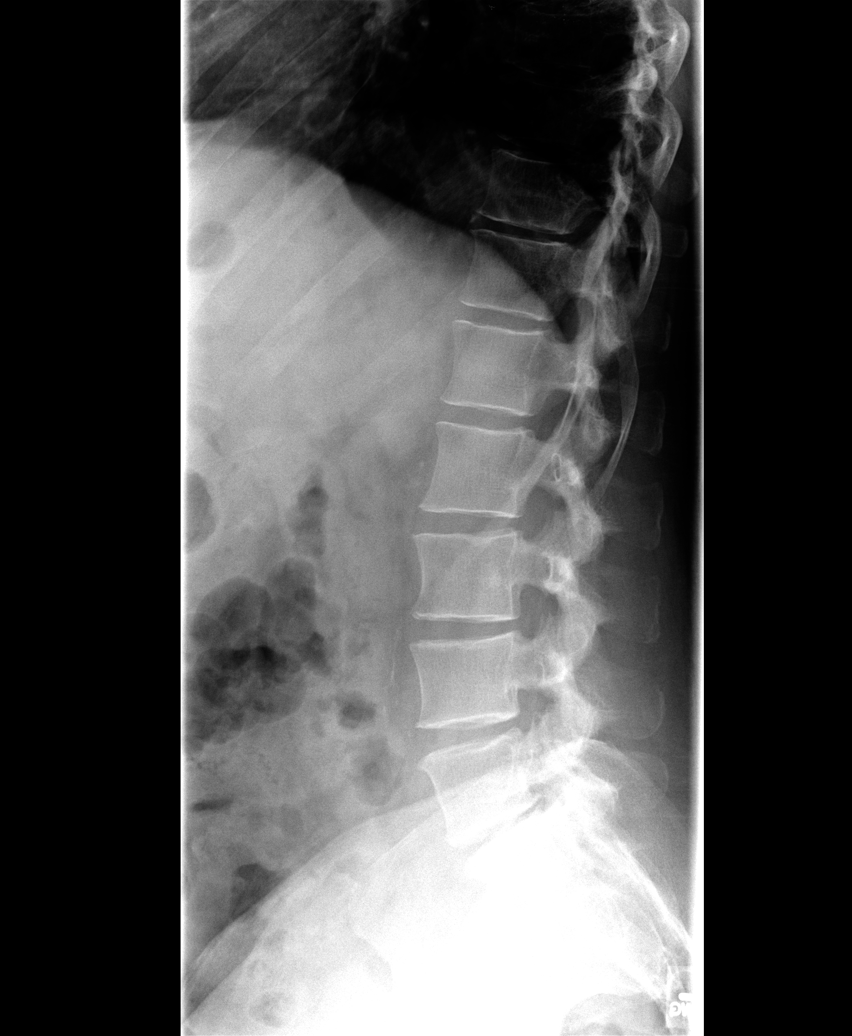

[view not recorded (5 of 5)]
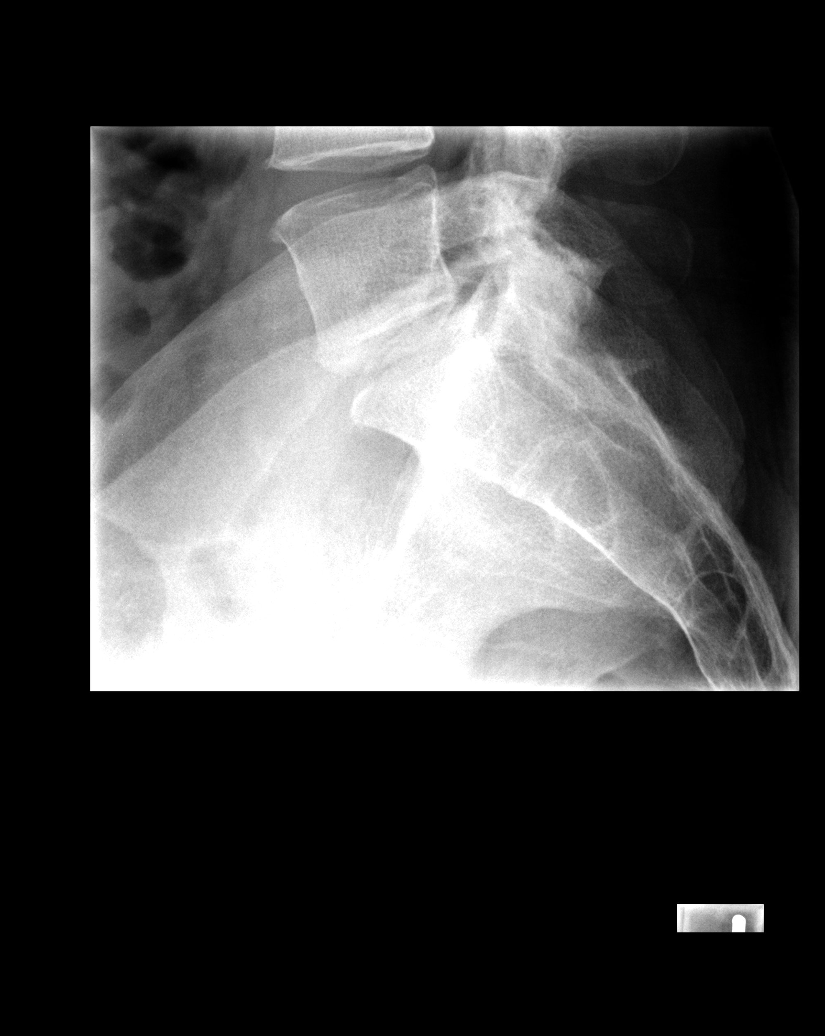

[5 of 5 positions shown; findings below may reference images not displayed]

FINDINGS: 5 nonrib-bearing lumbar vertebrae. Mild dextro-convex thoracolumbar scoliosis. Mild
anterior spur formation at multiple levels and lower lumbar spine facet degenerative changes. No
pars defects or subluxations.

IMPRESSION

Mild scoliosis and mild degenerative changes, as described above.

## 2006-07-16 ENCOUNTER — Encounter: Admission: RE | Admit: 2006-07-16 | Discharge: 2006-07-16 | Payer: Self-pay | Admitting: Sports Medicine

## 2006-08-04 ENCOUNTER — Ambulatory Visit: Payer: Self-pay | Admitting: *Deleted

## 2006-08-04 ENCOUNTER — Encounter (INDEPENDENT_AMBULATORY_CARE_PROVIDER_SITE_OTHER): Payer: Self-pay | Admitting: Unknown Physician Specialty

## 2006-08-08 ENCOUNTER — Telehealth (INDEPENDENT_AMBULATORY_CARE_PROVIDER_SITE_OTHER): Payer: Self-pay | Admitting: Pharmacy Technician

## 2006-08-11 ENCOUNTER — Telehealth: Payer: Self-pay | Admitting: *Deleted

## 2006-08-20 ENCOUNTER — Ambulatory Visit: Payer: Self-pay | Admitting: Gastroenterology

## 2006-08-28 ENCOUNTER — Encounter (INDEPENDENT_AMBULATORY_CARE_PROVIDER_SITE_OTHER): Payer: Self-pay | Admitting: Unknown Physician Specialty

## 2006-09-15 ENCOUNTER — Encounter (INDEPENDENT_AMBULATORY_CARE_PROVIDER_SITE_OTHER): Payer: Self-pay | Admitting: Unknown Physician Specialty

## 2006-09-15 ENCOUNTER — Ambulatory Visit: Payer: Self-pay | Admitting: Hospitalist

## 2006-10-23 ENCOUNTER — Ambulatory Visit: Payer: Self-pay | Admitting: Internal Medicine

## 2006-11-05 ENCOUNTER — Telehealth (INDEPENDENT_AMBULATORY_CARE_PROVIDER_SITE_OTHER): Payer: Self-pay | Admitting: Internal Medicine

## 2006-11-27 ENCOUNTER — Ambulatory Visit: Payer: Self-pay | Admitting: Internal Medicine

## 2006-11-27 ENCOUNTER — Encounter (INDEPENDENT_AMBULATORY_CARE_PROVIDER_SITE_OTHER): Payer: Self-pay | Admitting: Unknown Physician Specialty

## 2006-12-23 ENCOUNTER — Ambulatory Visit: Payer: Self-pay | Admitting: Internal Medicine

## 2006-12-23 DIAGNOSIS — J441 Chronic obstructive pulmonary disease with (acute) exacerbation: Secondary | ICD-10-CM | POA: Insufficient documentation

## 2006-12-23 DIAGNOSIS — J449 Chronic obstructive pulmonary disease, unspecified: Secondary | ICD-10-CM | POA: Insufficient documentation

## 2007-03-06 ENCOUNTER — Encounter: Payer: Self-pay | Admitting: *Deleted

## 2007-03-10 ENCOUNTER — Ambulatory Visit: Payer: Self-pay | Admitting: Hospitalist

## 2007-03-15 ENCOUNTER — Emergency Department (HOSPITAL_COMMUNITY): Admission: EM | Admit: 2007-03-15 | Discharge: 2007-03-15 | Payer: Self-pay | Admitting: Family Medicine

## 2007-03-31 ENCOUNTER — Encounter (INDEPENDENT_AMBULATORY_CARE_PROVIDER_SITE_OTHER): Payer: Self-pay | Admitting: Internal Medicine

## 2007-03-31 ENCOUNTER — Ambulatory Visit: Payer: Self-pay | Admitting: Internal Medicine

## 2007-03-31 DIAGNOSIS — IMO0001 Reserved for inherently not codable concepts without codable children: Secondary | ICD-10-CM | POA: Insufficient documentation

## 2007-03-31 LAB — CONVERTED CEMR LAB
ALT: 16 units/L (ref 0–35)
AST: 15 units/L (ref 0–37)
Albumin: 4.5 g/dL (ref 3.5–5.2)
Alkaline Phosphatase: 80 units/L (ref 39–117)
BUN: 12 mg/dL (ref 6–23)
CO2: 24 meq/L (ref 19–32)
Calcium: 9.6 mg/dL (ref 8.4–10.5)
Candida species: NEGATIVE
Chlamydia, DNA Probe: NEGATIVE
Chloride: 104 meq/L (ref 96–112)
Cholesterol: 225 mg/dL — ABNORMAL HIGH (ref 0–200)
Creatinine, Ser: 0.69 mg/dL (ref 0.40–1.20)
GC Probe Amp, Genital: NEGATIVE
Gardnerella vaginalis: NEGATIVE
Glucose, Bld: 82 mg/dL (ref 70–99)
HCT: 43.3 % (ref 36.0–46.0)
HDL: 36 mg/dL — ABNORMAL LOW (ref >39)
Hemoglobin: 14.6 g/dL (ref 12.0–15.0)
LDL Cholesterol: 143 mg/dL — ABNORMAL HIGH (ref 0–99)
MCHC: 33.7 g/dL (ref 30.0–36.0)
MCV: 88.2 fL (ref 78.0–100.0)
Pap Smear: NORMAL
Platelets: 234 10*3/uL (ref 150–400)
Potassium: 4.1 meq/L (ref 3.5–5.3)
RBC: 4.91 M/uL (ref 3.87–5.11)
RDW: 13.5 % (ref 11.5–14.0)
Sed Rate: 13 mm/hr (ref 0–22)
Sodium: 141 meq/L (ref 135–145)
TSH: 1.536 microintl units/mL (ref 0.350–5.50)
Total Bilirubin: 0.3 mg/dL (ref 0.3–1.2)
Total CHOL/HDL Ratio: 6.3
Total CK: 21 units/L (ref 7–177)
Total Protein: 6.9 g/dL (ref 6.0–8.3)
Trichomonal Vaginitis: NEGATIVE
Triglycerides: 230 mg/dL — ABNORMAL HIGH (ref <150)
VLDL: 46 mg/dL — ABNORMAL HIGH (ref 0–40)
WBC: 6.8 10*3/uL (ref 4.0–10.5)

## 2007-04-04 ENCOUNTER — Emergency Department (HOSPITAL_COMMUNITY): Admission: EM | Admit: 2007-04-04 | Discharge: 2007-04-05 | Payer: Self-pay | Admitting: Emergency Medicine

## 2007-04-06 ENCOUNTER — Encounter (INDEPENDENT_AMBULATORY_CARE_PROVIDER_SITE_OTHER): Payer: Self-pay | Admitting: Internal Medicine

## 2007-04-07 ENCOUNTER — Encounter (INDEPENDENT_AMBULATORY_CARE_PROVIDER_SITE_OTHER): Payer: Self-pay | Admitting: Internal Medicine

## 2007-04-10 ENCOUNTER — Ambulatory Visit (HOSPITAL_COMMUNITY): Admission: RE | Admit: 2007-04-10 | Discharge: 2007-04-10 | Payer: Self-pay | Admitting: Internal Medicine

## 2007-04-14 ENCOUNTER — Telehealth (INDEPENDENT_AMBULATORY_CARE_PROVIDER_SITE_OTHER): Payer: Self-pay | Admitting: Pharmacy Technician

## 2007-04-24 ENCOUNTER — Ambulatory Visit: Payer: Self-pay | Admitting: Internal Medicine
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
## 2007-06-18 ENCOUNTER — Ambulatory Visit: Payer: Self-pay | Admitting: Internal Medicine

## 2007-06-18 ENCOUNTER — Telehealth (INDEPENDENT_AMBULATORY_CARE_PROVIDER_SITE_OTHER): Payer: Self-pay | Admitting: *Deleted

## 2007-06-18 ENCOUNTER — Encounter (INDEPENDENT_AMBULATORY_CARE_PROVIDER_SITE_OTHER): Payer: Self-pay | Admitting: Internal Medicine

## 2007-06-18 DIAGNOSIS — M898X9 Other specified disorders of bone, unspecified site: Secondary | ICD-10-CM | POA: Insufficient documentation

## 2007-06-18 LAB — CONVERTED CEMR LAB
Blood in Urine, dipstick: NEGATIVE
Glucose, Urine, Semiquant: NEGATIVE
Ketones, urine, test strip: NEGATIVE
Nitrite: NEGATIVE
Protein, U semiquant: NEGATIVE
Specific Gravity, Urine: >=1.03
Urobilinogen, UA: 0.2
WBC Urine, dipstick: NEGATIVE
pH: 5

## 2007-06-24 ENCOUNTER — Encounter (INDEPENDENT_AMBULATORY_CARE_PROVIDER_SITE_OTHER): Payer: Self-pay | Admitting: Internal Medicine

## 2007-06-24 ENCOUNTER — Ambulatory Visit (HOSPITAL_COMMUNITY): Admission: RE | Admit: 2007-06-24 | Discharge: 2007-06-24 | Payer: Self-pay | Admitting: Internal Medicine

## 2007-08-02 IMAGING — CR DG LUMBAR SPINE COMPLETE 4+V
5 series · 5 of 5 positions shown · non-contrast
Comparison: 03/10/04.

CLINICAL DATA: Chronic back pain.  
 LUMBAR SPINE ? 5 VIEWS:

[t l-spine a.p.]
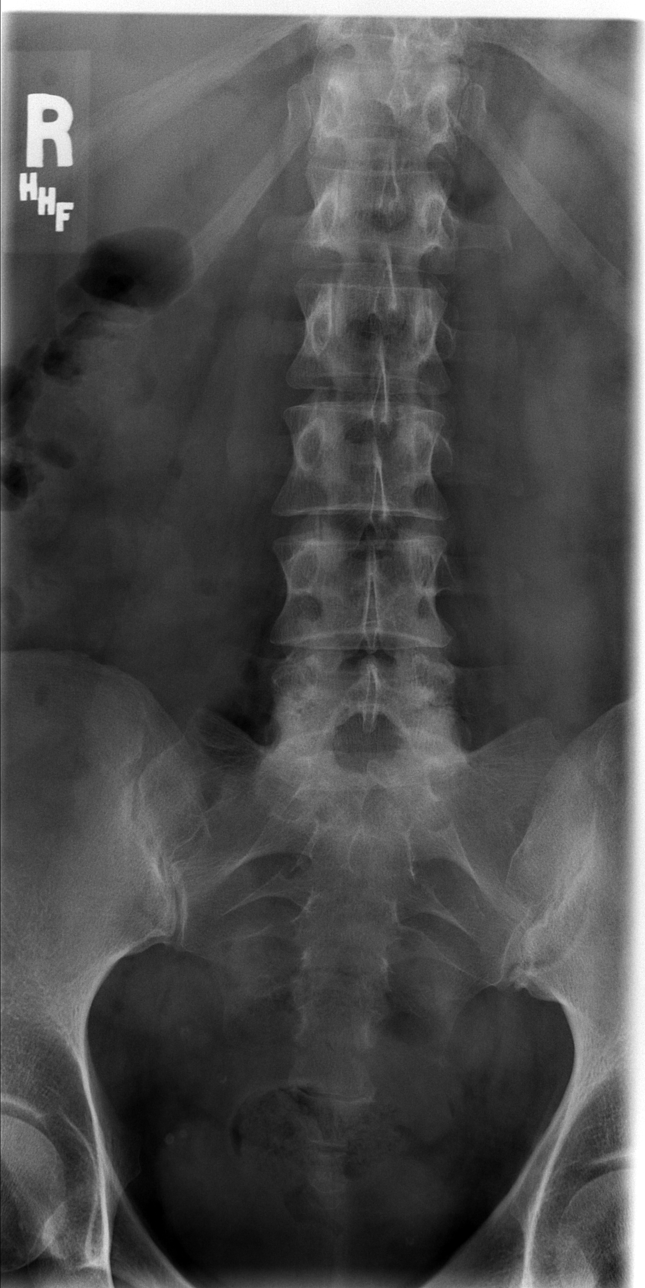

[t l-spine oblique exposure (1 of 2)]
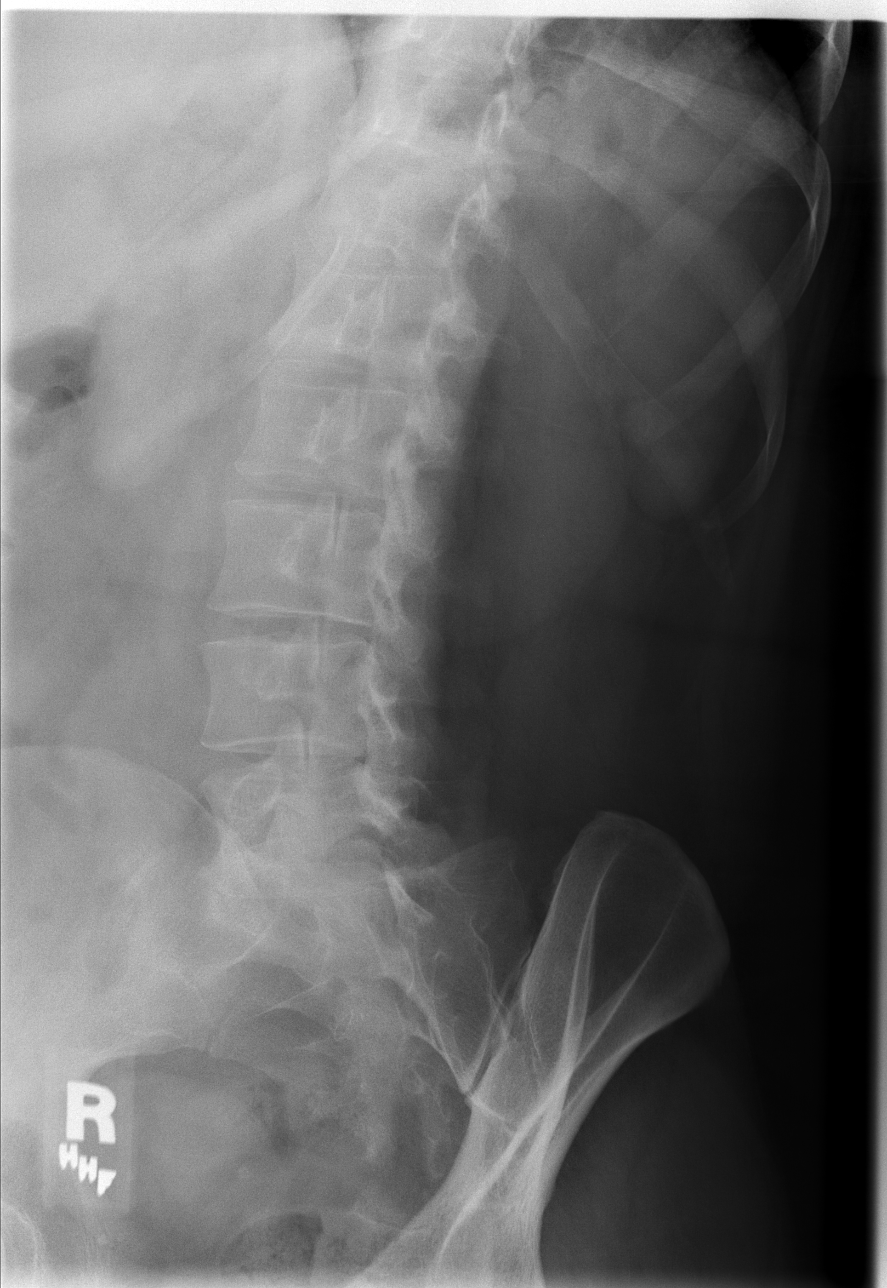

[t l-spine oblique exposure (2 of 2)]
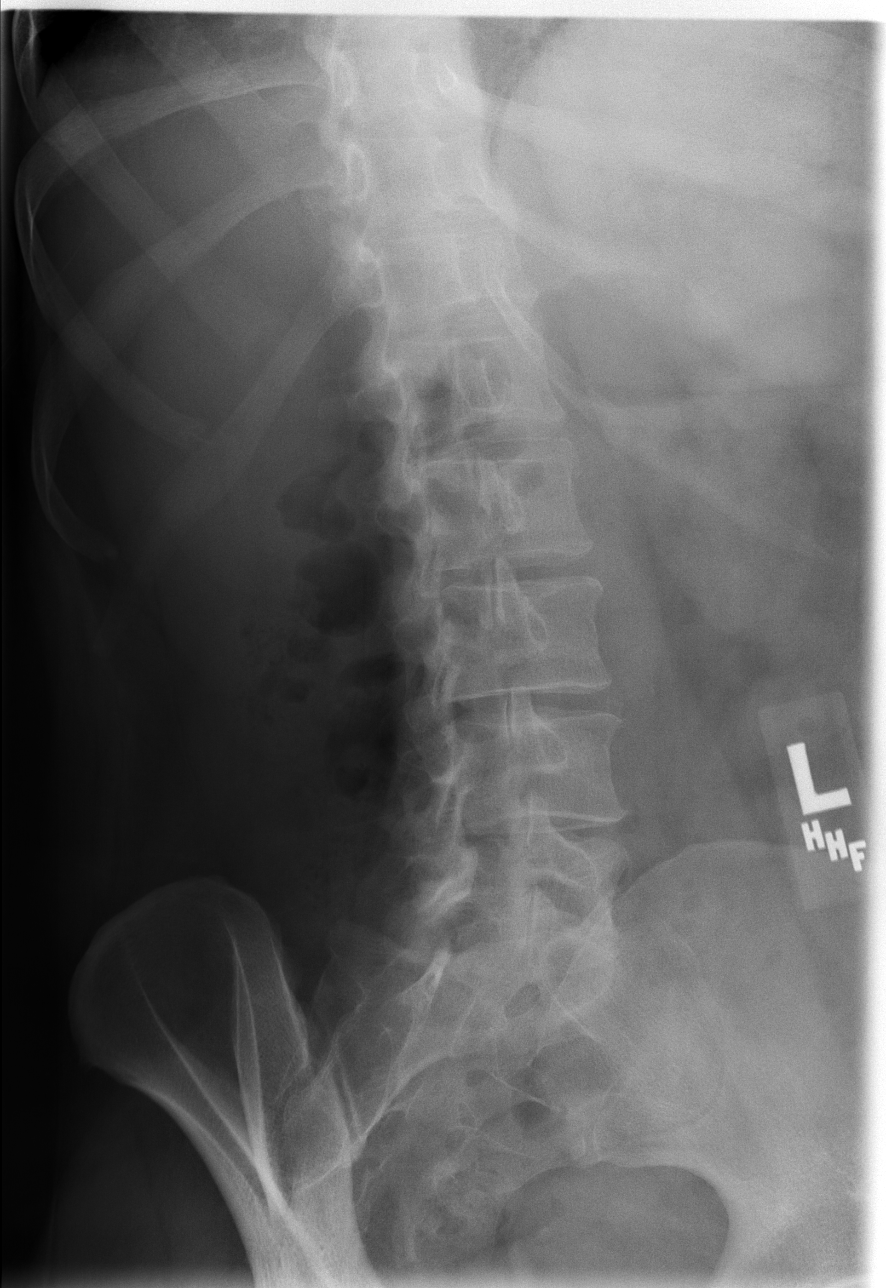

[t l-spine lat]
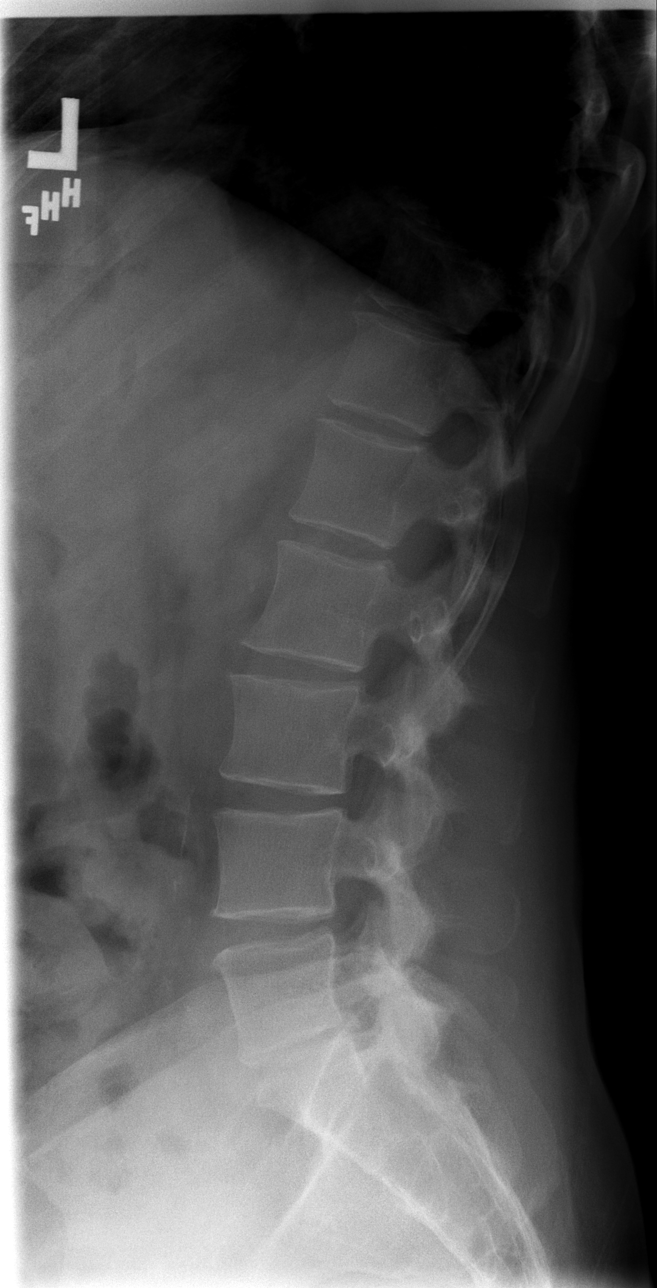

[t l-spine l5-s1 spot]
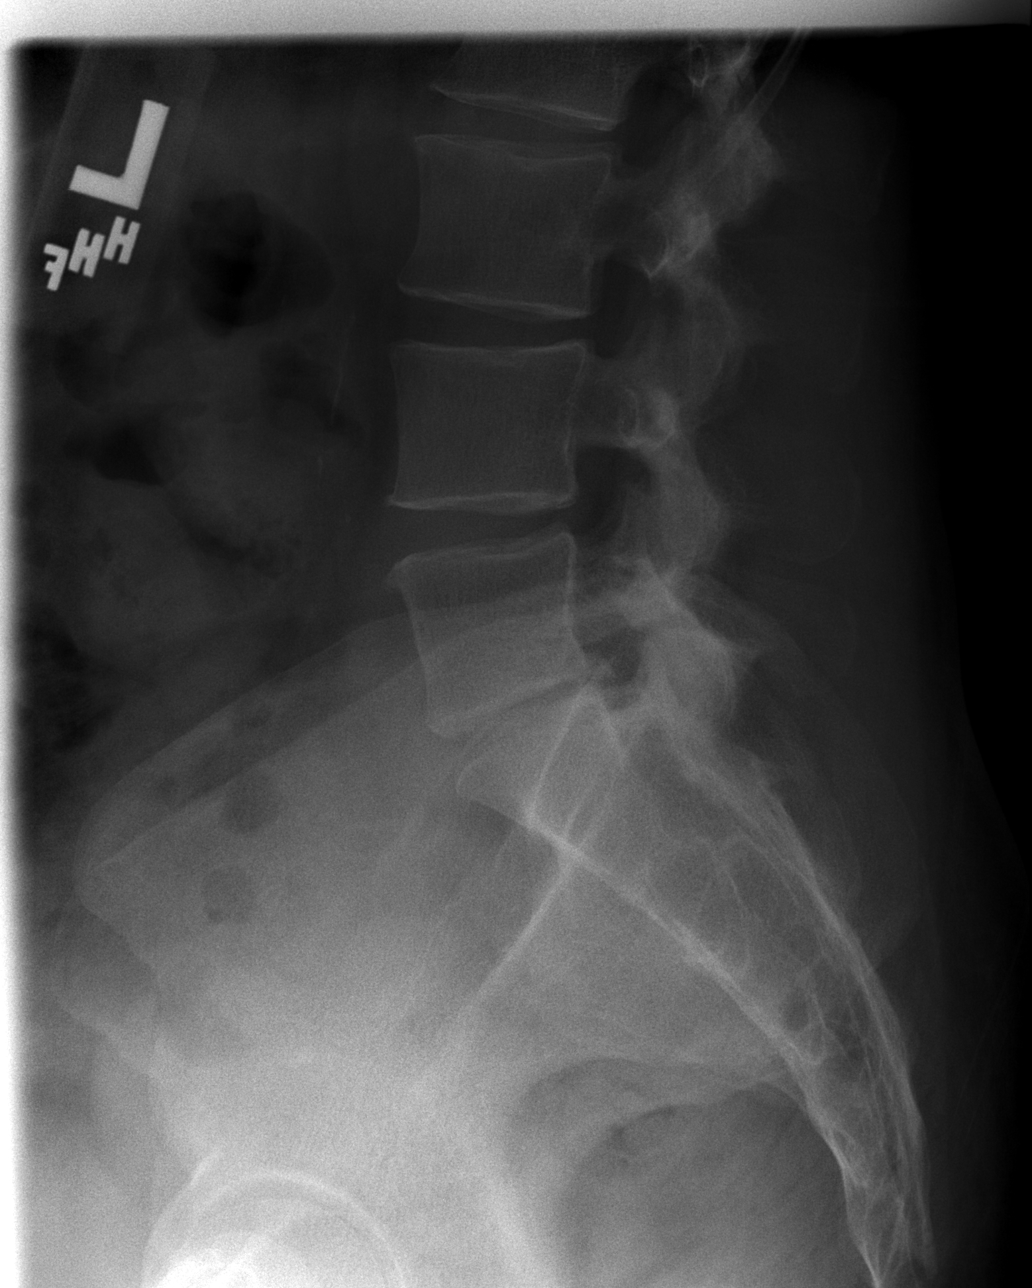

[5 of 5 positions shown; findings below may reference images not displayed]

FINDINGS: The lateral view demonstrates maintenance of vertebral body height and alignment.  Degenerative disc disease at the lumbosacral junction with loss of intervertebral disc height and end plate osteophyte formation.  Less marked findings are seen at the L4-5 level.  
 Oblique images demonstrate no evidence of pars defect.  Minimal convex right curvature in the upper lumbar spine.  Visualized portions of the pelvis and lower ribs are within normal limits.  Atherosclerosis in the abdominal aorta.
IMPRESSION: Similar appearance of lower lumbar degenerative disc disease and mild spondylosis as described.

## 2007-08-05 ENCOUNTER — Ambulatory Visit: Payer: Self-pay | Admitting: Obstetrics & Gynecology

## 2007-08-05 ENCOUNTER — Observation Stay (HOSPITAL_COMMUNITY): Admission: EM | Admit: 2007-08-05 | Discharge: 2007-08-06 | Payer: Self-pay | Admitting: Emergency Medicine

## 2007-08-05 ENCOUNTER — Ambulatory Visit: Payer: Self-pay | Admitting: Cardiovascular Disease

## 2007-08-05 ENCOUNTER — Ambulatory Visit: Payer: Self-pay | Admitting: Internal Medicine

## 2007-08-06 ENCOUNTER — Encounter (INDEPENDENT_AMBULATORY_CARE_PROVIDER_SITE_OTHER): Payer: Self-pay | Admitting: Internal Medicine

## 2007-08-10 ENCOUNTER — Encounter (INDEPENDENT_AMBULATORY_CARE_PROVIDER_SITE_OTHER): Payer: Self-pay | Admitting: Internal Medicine

## 2007-08-10 ENCOUNTER — Encounter: Admission: RE | Admit: 2007-08-10 | Discharge: 2007-08-10 | Payer: Self-pay | Admitting: Internal Medicine

## 2007-08-10 ENCOUNTER — Ambulatory Visit: Payer: Self-pay | Admitting: *Deleted

## 2007-08-10 DIAGNOSIS — R635 Abnormal weight gain: Secondary | ICD-10-CM | POA: Insufficient documentation

## 2007-08-10 DIAGNOSIS — E785 Hyperlipidemia, unspecified: Secondary | ICD-10-CM | POA: Insufficient documentation

## 2007-08-10 LAB — CONVERTED CEMR LAB: TSH: 1.419 microintl units/mL (ref 0.350–5.50)

## 2007-08-11 ENCOUNTER — Ambulatory Visit (HOSPITAL_COMMUNITY): Admission: RE | Admit: 2007-08-11 | Discharge: 2007-08-11 | Payer: Self-pay | Admitting: Obstetrics & Gynecology

## 2007-08-11 ENCOUNTER — Encounter (INDEPENDENT_AMBULATORY_CARE_PROVIDER_SITE_OTHER): Payer: Self-pay | Admitting: Internal Medicine

## 2007-08-18 ENCOUNTER — Telehealth (INDEPENDENT_AMBULATORY_CARE_PROVIDER_SITE_OTHER): Payer: Self-pay | Admitting: Pharmacy Technician

## 2007-08-21 ENCOUNTER — Emergency Department (HOSPITAL_COMMUNITY): Admission: EM | Admit: 2007-08-21 | Discharge: 2007-08-21 | Payer: Self-pay | Admitting: Family Medicine

## 2007-08-27 ENCOUNTER — Ambulatory Visit: Payer: Self-pay | Admitting: *Deleted

## 2007-08-30 ENCOUNTER — Telehealth (INDEPENDENT_AMBULATORY_CARE_PROVIDER_SITE_OTHER): Payer: Self-pay | Admitting: Internal Medicine

## 2007-08-30 DIAGNOSIS — R7309 Other abnormal glucose: Secondary | ICD-10-CM | POA: Insufficient documentation

## 2007-08-31 ENCOUNTER — Encounter (INDEPENDENT_AMBULATORY_CARE_PROVIDER_SITE_OTHER): Payer: Self-pay | Admitting: Internal Medicine

## 2007-09-01 ENCOUNTER — Telehealth (INDEPENDENT_AMBULATORY_CARE_PROVIDER_SITE_OTHER): Payer: Self-pay | Admitting: *Deleted

## 2007-09-02 ENCOUNTER — Emergency Department (HOSPITAL_COMMUNITY): Admission: EM | Admit: 2007-09-02 | Discharge: 2007-09-02 | Payer: Self-pay | Admitting: Emergency Medicine

## 2007-09-03 ENCOUNTER — Ambulatory Visit: Payer: Self-pay | Admitting: Hospitalist

## 2007-09-03 ENCOUNTER — Encounter (INDEPENDENT_AMBULATORY_CARE_PROVIDER_SITE_OTHER): Payer: Self-pay | Admitting: Internal Medicine

## 2007-09-03 LAB — CONVERTED CEMR LAB
Sed Rate: 12 mm/hr (ref 0–22)
Vitamin B-12: 422 pg/mL (ref 211–911)

## 2007-09-04 ENCOUNTER — Emergency Department (HOSPITAL_COMMUNITY): Admission: EM | Admit: 2007-09-04 | Discharge: 2007-09-04 | Payer: Self-pay | Admitting: Emergency Medicine

## 2007-09-06 ENCOUNTER — Ambulatory Visit (HOSPITAL_COMMUNITY): Admission: RE | Admit: 2007-09-06 | Discharge: 2007-09-06 | Payer: Self-pay | Admitting: Internal Medicine

## 2007-09-07 ENCOUNTER — Ambulatory Visit (HOSPITAL_COMMUNITY): Admission: RE | Admit: 2007-09-07 | Discharge: 2007-09-07 | Payer: Self-pay | Admitting: Neurosurgery

## 2007-09-16 ENCOUNTER — Encounter (INDEPENDENT_AMBULATORY_CARE_PROVIDER_SITE_OTHER): Payer: Self-pay | Admitting: Internal Medicine

## 2007-09-24 ENCOUNTER — Telehealth (INDEPENDENT_AMBULATORY_CARE_PROVIDER_SITE_OTHER): Payer: Self-pay | Admitting: Internal Medicine

## 2007-09-29 ENCOUNTER — Encounter: Admission: RE | Admit: 2007-09-29 | Discharge: 2007-11-12 | Payer: Self-pay | Admitting: Neurosurgery

## 2007-10-01 ENCOUNTER — Ambulatory Visit: Payer: Self-pay | Admitting: Infectious Disease

## 2007-10-01 DIAGNOSIS — H9319 Tinnitus, unspecified ear: Secondary | ICD-10-CM | POA: Insufficient documentation

## 2007-10-01 DIAGNOSIS — G4700 Insomnia, unspecified: Secondary | ICD-10-CM | POA: Insufficient documentation

## 2007-10-01 DIAGNOSIS — D179 Benign lipomatous neoplasm, unspecified: Secondary | ICD-10-CM | POA: Insufficient documentation

## 2007-10-05 ENCOUNTER — Encounter (INDEPENDENT_AMBULATORY_CARE_PROVIDER_SITE_OTHER): Payer: Self-pay | Admitting: Internal Medicine

## 2007-10-05 ENCOUNTER — Ambulatory Visit: Payer: Self-pay | Admitting: Internal Medicine

## 2007-10-05 ENCOUNTER — Ambulatory Visit (HOSPITAL_COMMUNITY): Admission: RE | Admit: 2007-10-05 | Discharge: 2007-10-05 | Payer: Self-pay | Admitting: Internal Medicine

## 2007-10-09 ENCOUNTER — Telehealth (INDEPENDENT_AMBULATORY_CARE_PROVIDER_SITE_OTHER): Payer: Self-pay | Admitting: Internal Medicine

## 2007-10-09 LAB — CONVERTED CEMR LAB
BUN: 16 mg/dL (ref 6–23)
CO2: 25 meq/L (ref 19–32)
Calcium: 9.6 mg/dL (ref 8.4–10.5)
Chloride: 108 meq/L (ref 96–112)
Creatinine, Ser: 0.73 mg/dL (ref 0.40–1.20)
Glucose, Bld: 121 mg/dL — ABNORMAL HIGH (ref 70–99)
Potassium: 4.7 meq/L (ref 3.5–5.3)
Sodium: 142 meq/L (ref 135–145)

## 2007-10-19 IMAGING — CR DG CHEST 1V PORT
1 series · 1 of 1 positions shown · non-contrast
Comparison: none

CLINICAL DATA: Chest pain.  
 PORTABLE CHEST- 1 VIEW (6699 hours):

[view not recorded]
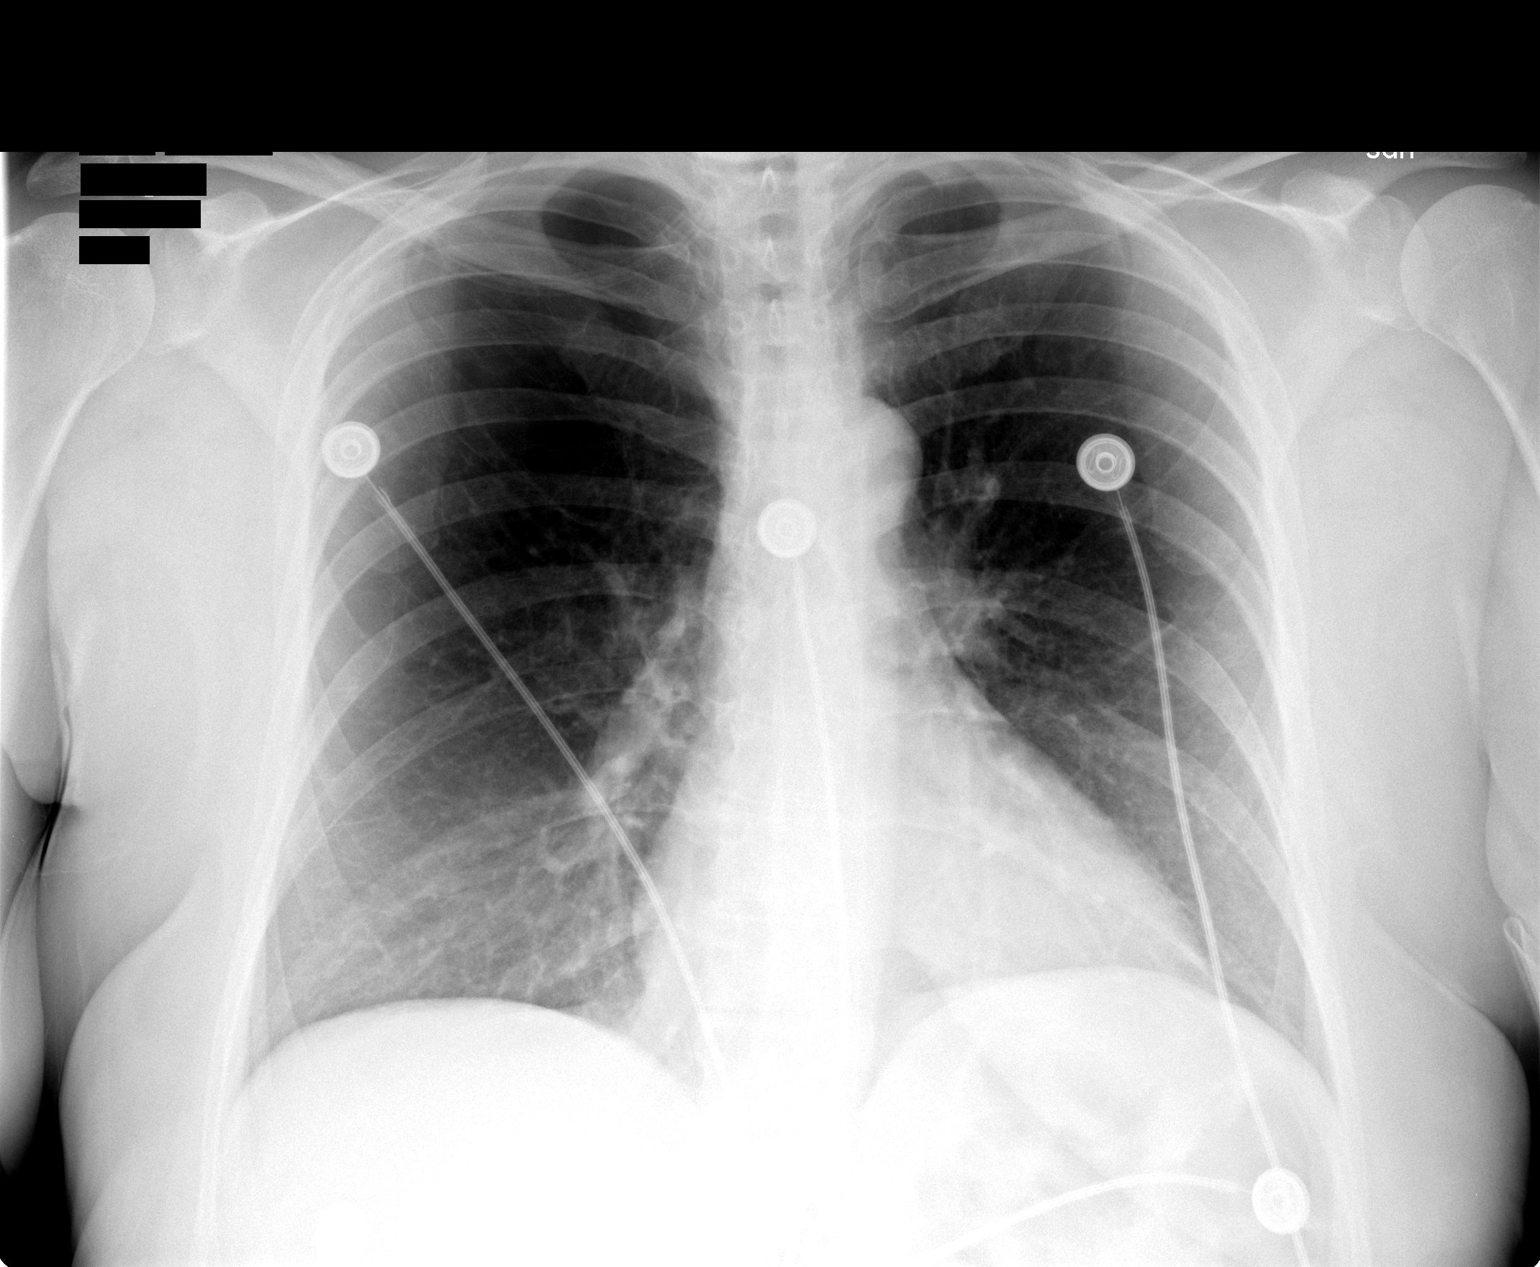

[1 of 1 positions shown; findings below may reference images not displayed]

FINDINGS: The heart is normal in size.  Peribronchial thickening is present likely chronic.  Bullous changes are present in the right upper lobe.  No pneumothoraces or effusions are seen.  The pulmonary vasculature is within normal limits.
IMPRESSION: No acute cardiopulmonary disease.  Right upper lobe bullae.

## 2007-10-27 ENCOUNTER — Ambulatory Visit: Payer: Self-pay | Admitting: Internal Medicine

## 2007-11-03 ENCOUNTER — Ambulatory Visit (HOSPITAL_COMMUNITY): Admission: RE | Admit: 2007-11-03 | Discharge: 2007-11-03 | Payer: Self-pay | Admitting: Internal Medicine

## 2008-01-03 IMAGING — CR DG FOOT COMPLETE 3+V*R*
3 series · 3 of 3 positions shown · non-contrast
Comparison: none

CLINICAL DATA: 49 year-old-female with heel pain x 2 months. 
 RIGHT FOOT - 3 VIEW ? 09/05/05:

[t foot ap right (1 of 2)]
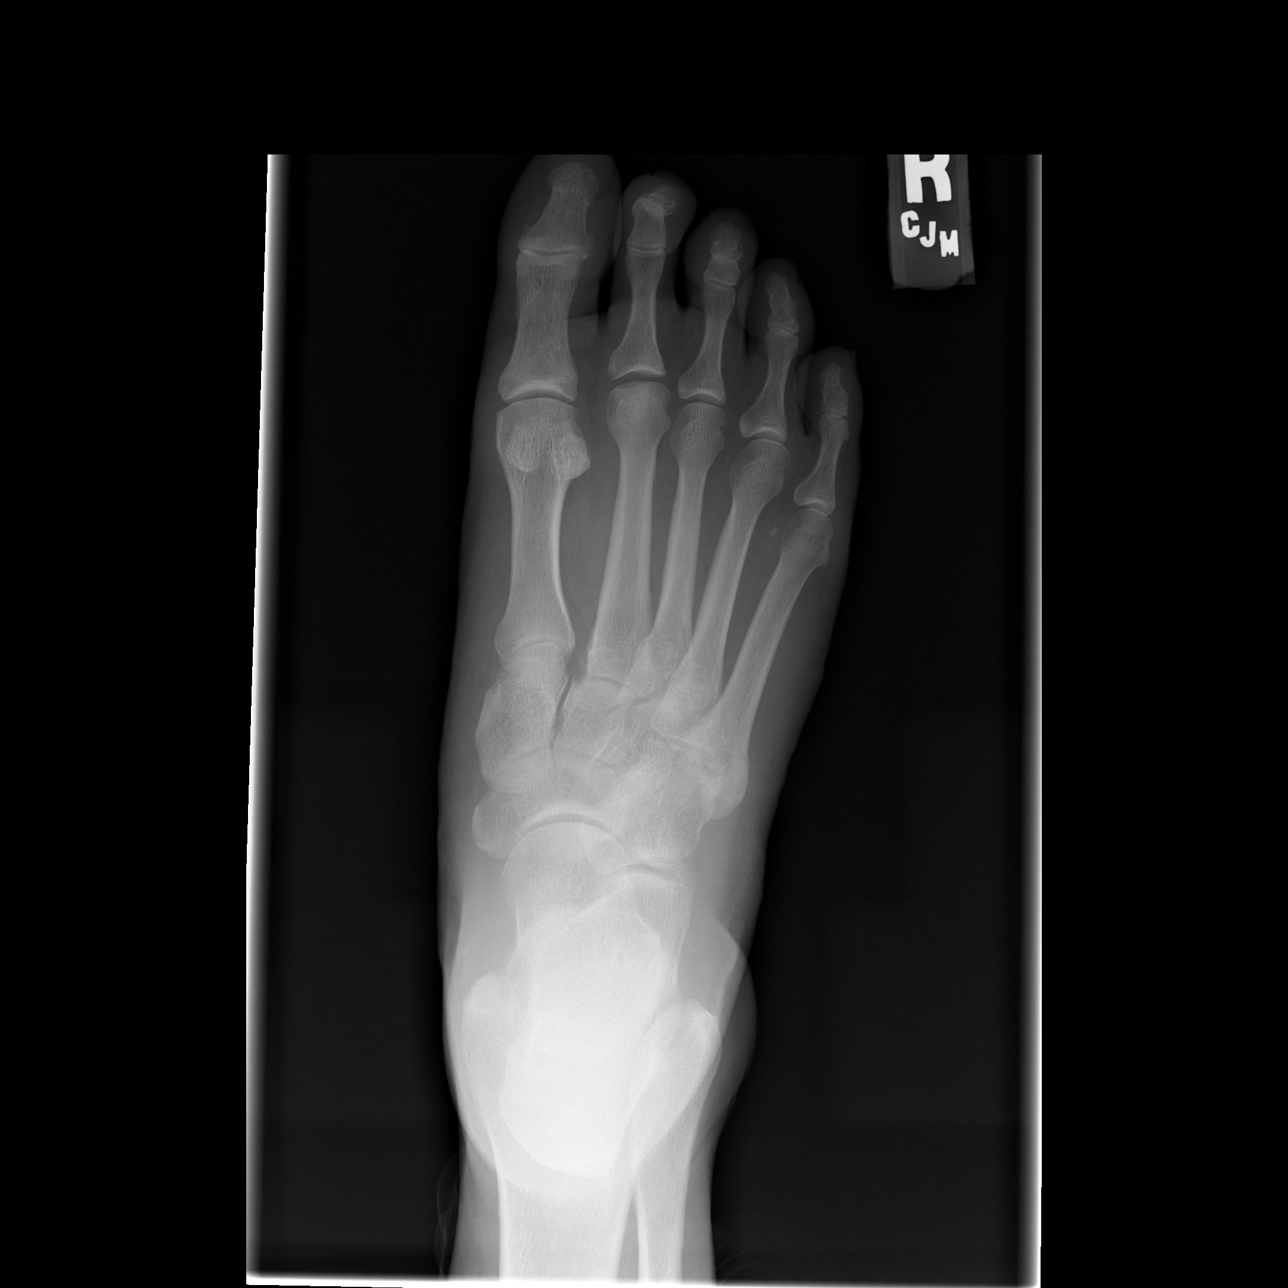

[t foot ap right (2 of 2)]
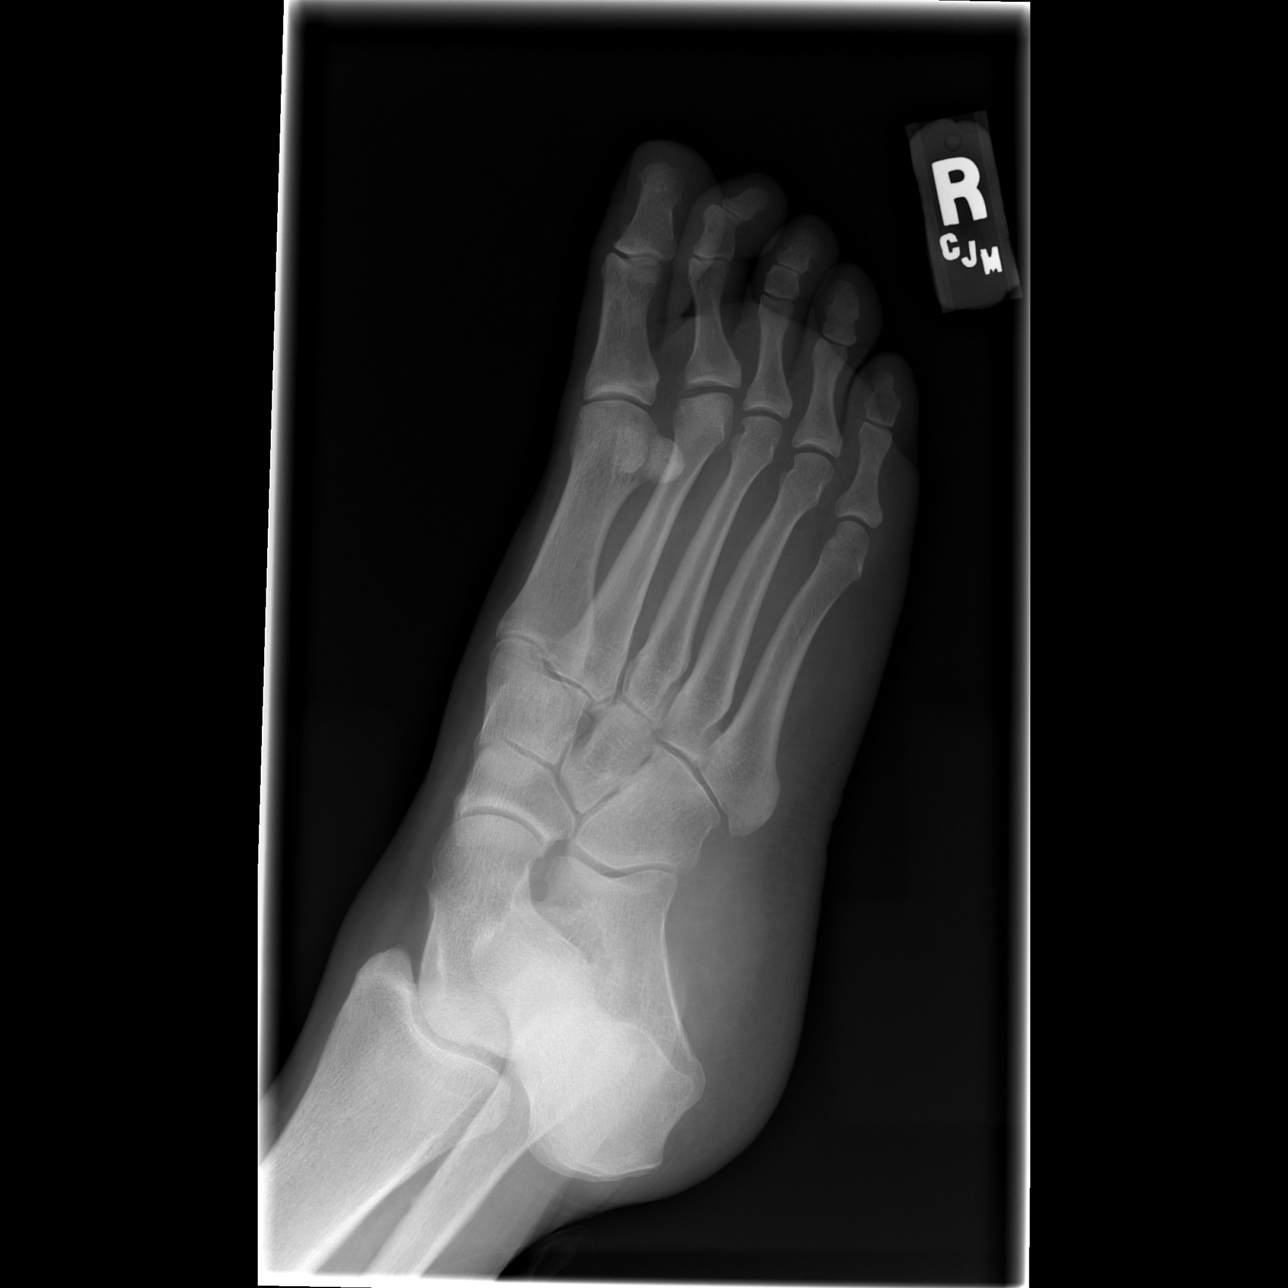

[t foot lat right]
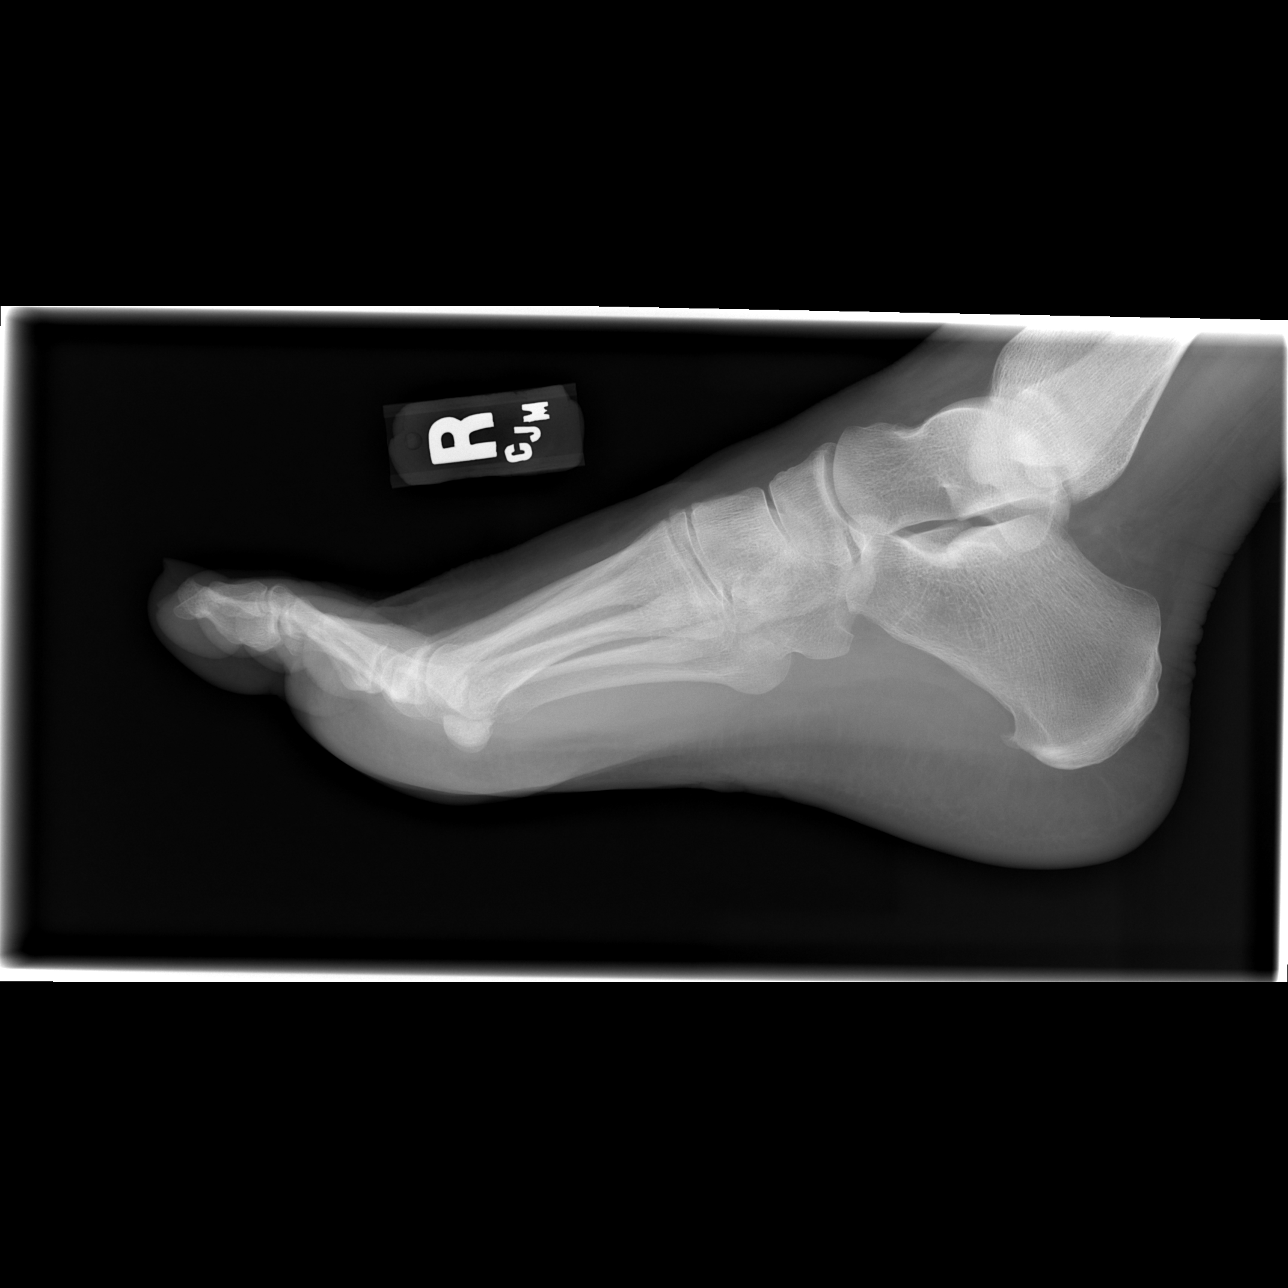

[3 of 3 positions shown; findings below may reference images not displayed]

FINDINGS: A small calcaneal spur is present.  No acute bone or soft tissue abnormality is evident.
IMPRESSION: Small plantar calcaneal spur.

## 2008-01-19 ENCOUNTER — Encounter: Admission: RE | Admit: 2008-01-19 | Discharge: 2008-04-04 | Payer: Self-pay | Admitting: Neurosurgery

## 2008-01-26 ENCOUNTER — Ambulatory Visit: Payer: Self-pay | Admitting: Internal Medicine

## 2008-01-26 ENCOUNTER — Encounter (INDEPENDENT_AMBULATORY_CARE_PROVIDER_SITE_OTHER): Payer: Self-pay | Admitting: Internal Medicine

## 2008-01-26 LAB — CONVERTED CEMR LAB
ALT: 11 units/L (ref 0–35)
AST: 13 units/L (ref 0–37)
Albumin: 4.5 g/dL (ref 3.5–5.2)
Alkaline Phosphatase: 69 units/L (ref 39–117)
BUN: 17 mg/dL (ref 6–23)
CO2: 23 meq/L (ref 19–32)
Calcium: 9.9 mg/dL (ref 8.4–10.5)
Chloride: 107 meq/L (ref 96–112)
Creatinine, Ser: 0.79 mg/dL (ref 0.40–1.20)
Glucose, Bld: 104 mg/dL — ABNORMAL HIGH (ref 70–99)
HCT: 42.3 % (ref 36.0–46.0)
HCV Ab: NEGATIVE
Hemoglobin: 13.8 g/dL (ref 12.0–15.0)
INR: 1 (ref 0.0–1.5)
MCHC: 32.6 g/dL (ref 30.0–36.0)
MCV: 87.9 fL (ref 78.0–100.0)
Platelets: 235 10*3/uL (ref 150–400)
Potassium: 4.6 meq/L (ref 3.5–5.3)
Prothrombin Time: 13.5 s (ref 11.6–15.2)
RBC: 4.81 M/uL (ref 3.87–5.11)
RDW: 13.7 % (ref 11.5–15.5)
Sodium: 141 meq/L (ref 135–145)
Total Bilirubin: 0.5 mg/dL (ref 0.3–1.2)
Total Protein: 7.1 g/dL (ref 6.0–8.3)
WBC: 5.1 10*3/uL (ref 4.0–10.5)
aPTT: 30 s (ref 24–37)

## 2008-02-07 ENCOUNTER — Emergency Department (HOSPITAL_COMMUNITY): Admission: EM | Admit: 2008-02-07 | Discharge: 2008-02-07 | Payer: Self-pay | Admitting: Emergency Medicine

## 2008-02-10 ENCOUNTER — Encounter (INDEPENDENT_AMBULATORY_CARE_PROVIDER_SITE_OTHER): Payer: Self-pay | Admitting: Internal Medicine

## 2008-02-14 IMAGING — CR DG CHEST 2V
2 series · 2 of 2 positions shown · non-contrast
Comparison: 09/05/05.

CLINICAL DATA: Cough with sputum for two weeks.
 CHEST ? 2 VIEW:

[w chest pa]
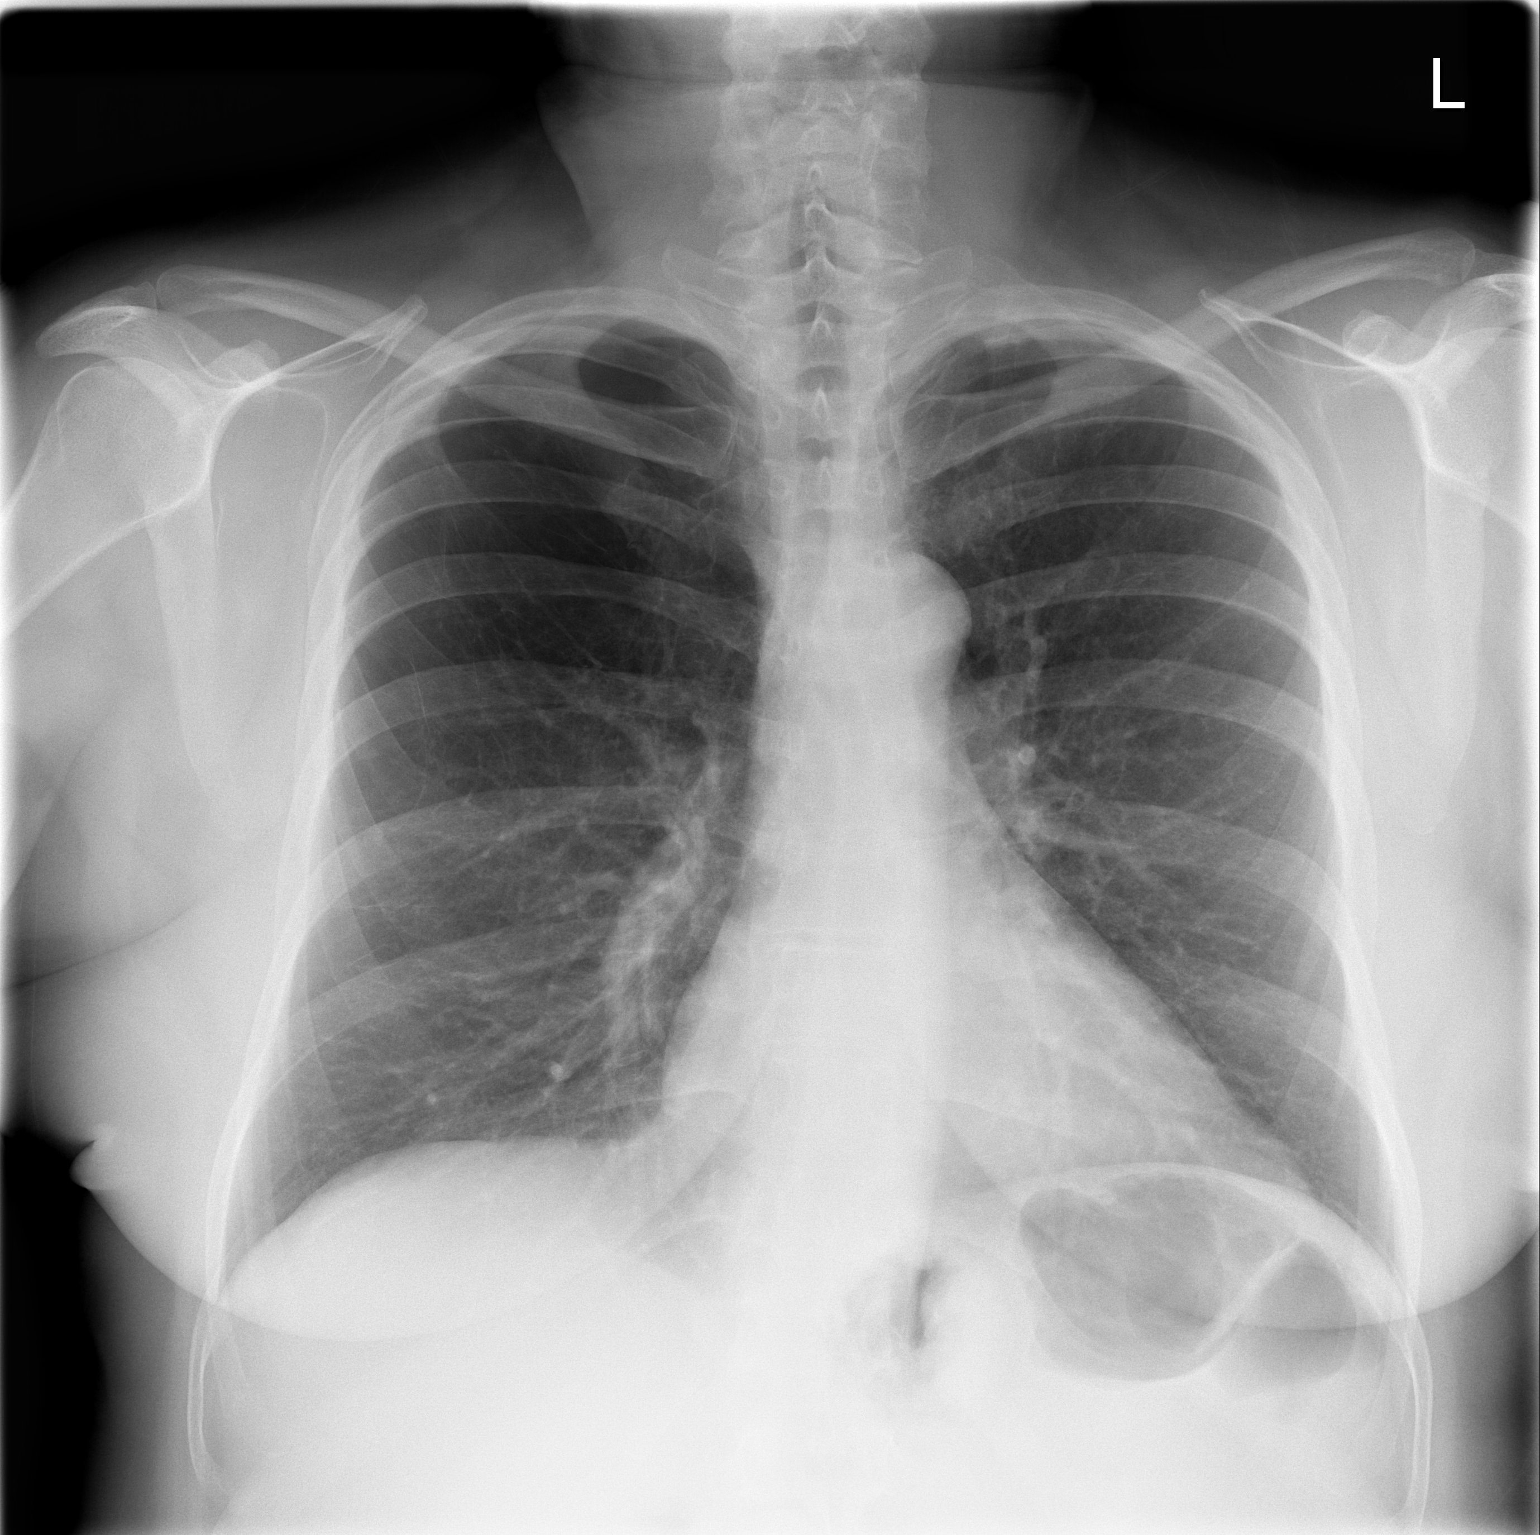

[w chest lat]
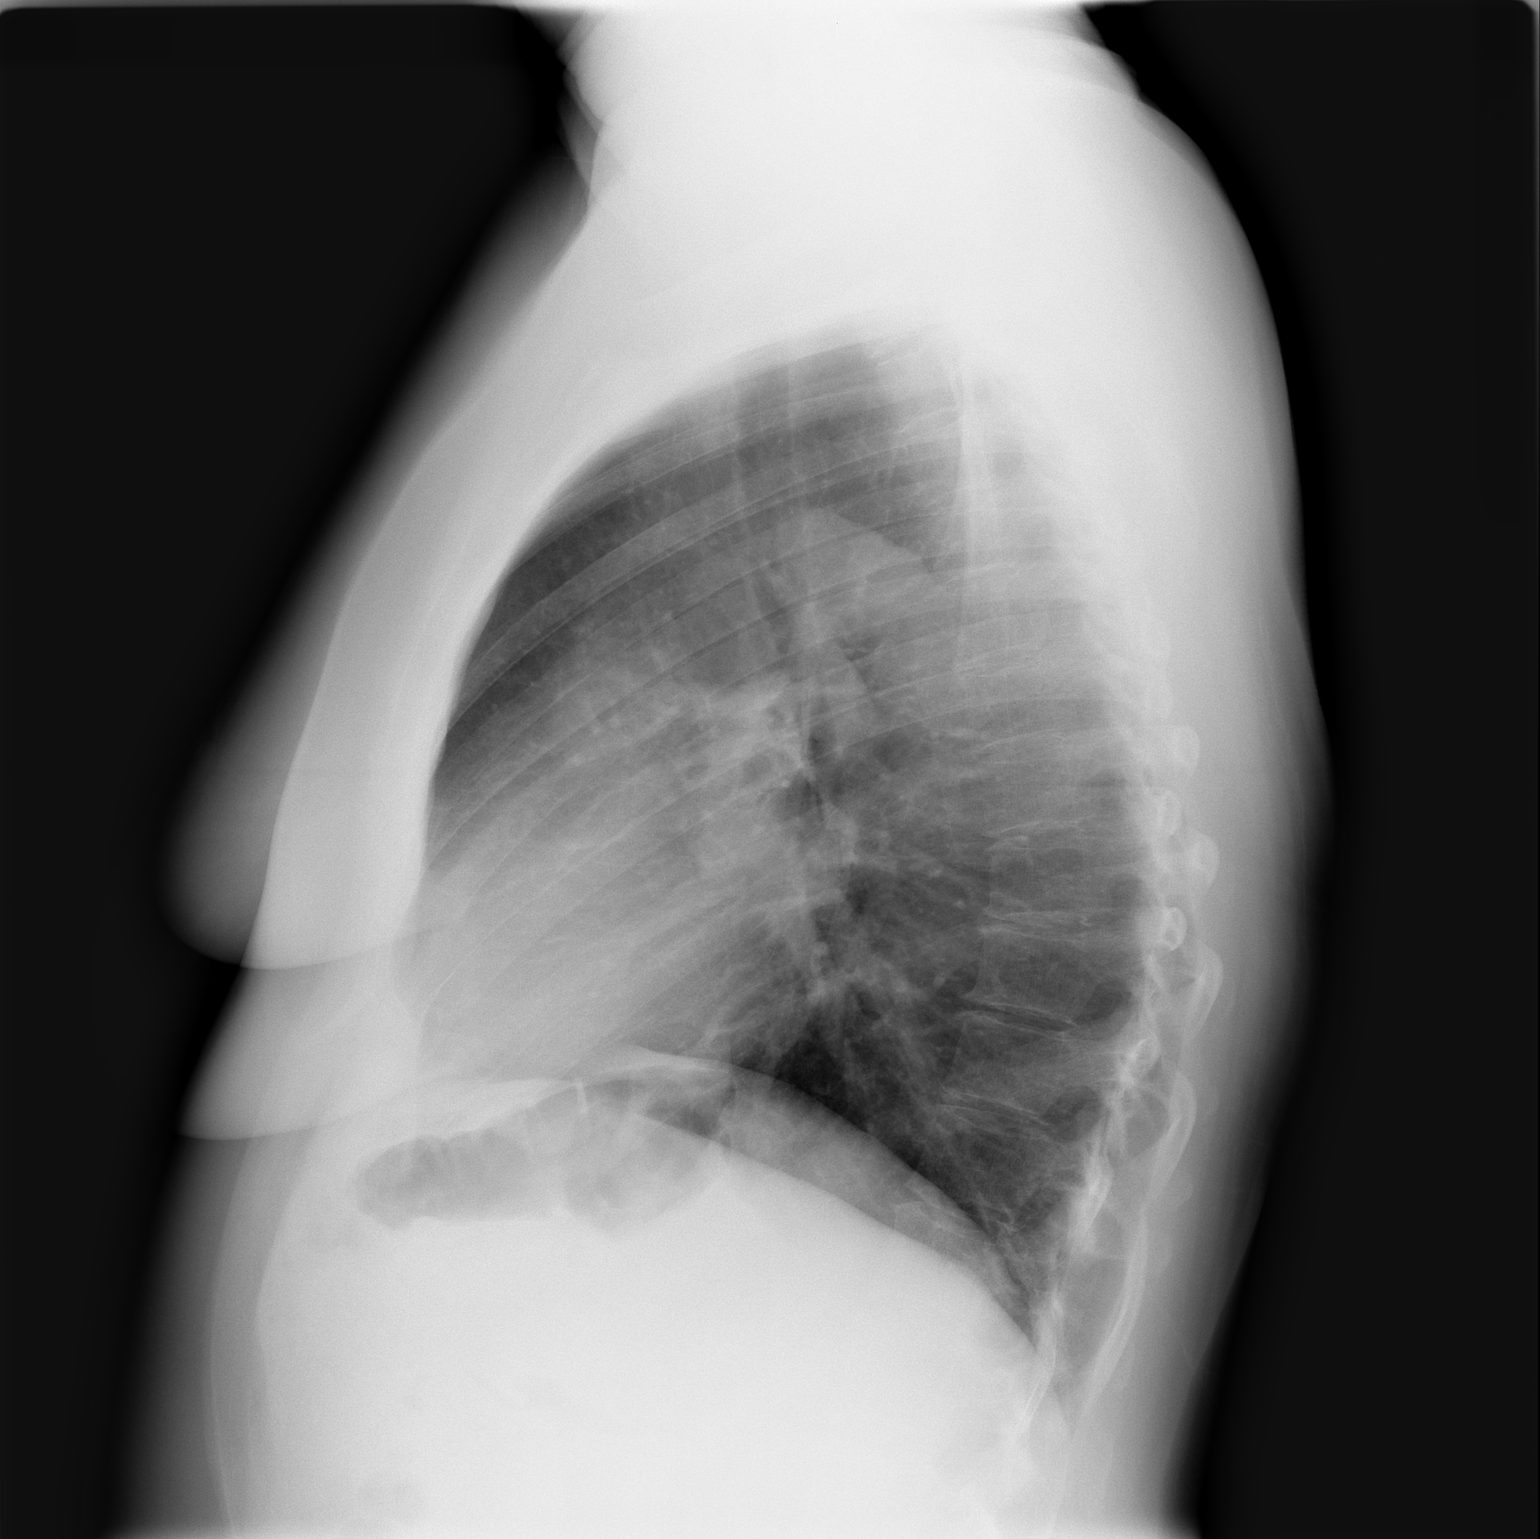

[2 of 2 positions shown; findings below may reference images not displayed]

FINDINGS: The cardiac silhouette, mediastinum, and pulmonary vasculature are within normal limits.  Both lungs are clear.
IMPRESSION: Stable chest x-ray with no evidence for acute cardiac or pulmonary process.

## 2008-02-18 ENCOUNTER — Encounter: Payer: Self-pay | Admitting: *Deleted

## 2008-04-20 ENCOUNTER — Encounter: Admission: RE | Admit: 2008-04-20 | Discharge: 2008-04-20 | Payer: Self-pay | Admitting: Sports Medicine

## 2008-04-21 ENCOUNTER — Encounter (INDEPENDENT_AMBULATORY_CARE_PROVIDER_SITE_OTHER): Payer: Self-pay | Admitting: Internal Medicine

## 2008-04-21 ENCOUNTER — Ambulatory Visit: Payer: Self-pay | Admitting: Internal Medicine

## 2008-04-21 LAB — CONVERTED CEMR LAB
Candida species: NEGATIVE
Chlamydia, DNA Probe: NEGATIVE
GC Probe Amp, Genital: NEGATIVE
Gardnerella vaginalis: NEGATIVE
Hgb A1c MFr Bld: 5.8 %
Trichomonal Vaginitis: NEGATIVE

## 2008-04-25 LAB — CONVERTED CEMR LAB
ALT: 12 units/L (ref 0–35)
AST: 12 units/L (ref 0–37)
Albumin: 4.6 g/dL (ref 3.5–5.2)
Alkaline Phosphatase: 74 units/L (ref 39–117)
BUN: 13 mg/dL (ref 6–23)
CO2: 23 meq/L (ref 19–32)
Calcium: 9.4 mg/dL (ref 8.4–10.5)
Chloride: 105 meq/L (ref 96–112)
Cholesterol: 181 mg/dL (ref 0–200)
Creatinine, Ser: 0.66 mg/dL (ref 0.40–1.20)
Glucose, Bld: 92 mg/dL (ref 70–99)
HDL: 42 mg/dL (ref >39)
LDL Cholesterol: 115 mg/dL — ABNORMAL HIGH (ref 0–99)
Potassium: 4.3 meq/L (ref 3.5–5.3)
Sodium: 141 meq/L (ref 135–145)
Total Bilirubin: 0.6 mg/dL (ref 0.3–1.2)
Total CHOL/HDL Ratio: 4.3
Total Protein: 7 g/dL (ref 6.0–8.3)
Triglycerides: 120 mg/dL (ref <150)
VLDL: 24 mg/dL (ref 0–40)

## 2008-05-31 ENCOUNTER — Telehealth: Payer: Self-pay | Admitting: *Deleted

## 2008-06-01 ENCOUNTER — Observation Stay (HOSPITAL_COMMUNITY): Admission: EM | Admit: 2008-06-01 | Discharge: 2008-06-02 | Payer: Self-pay | Admitting: Emergency Medicine

## 2008-06-01 ENCOUNTER — Ambulatory Visit: Payer: Self-pay | Admitting: Internal Medicine

## 2008-06-02 ENCOUNTER — Encounter: Payer: Self-pay | Admitting: Internal Medicine

## 2008-06-07 ENCOUNTER — Emergency Department (HOSPITAL_COMMUNITY): Admission: EM | Admit: 2008-06-07 | Discharge: 2008-06-07 | Payer: Self-pay | Admitting: Family Medicine

## 2008-06-14 ENCOUNTER — Encounter: Payer: Self-pay | Admitting: *Deleted

## 2008-06-14 LAB — CONVERTED CEMR LAB: LDL Cholesterol: 125 mg/dL

## 2008-06-22 ENCOUNTER — Encounter (INDEPENDENT_AMBULATORY_CARE_PROVIDER_SITE_OTHER): Payer: Self-pay | Admitting: Internal Medicine

## 2008-06-30 ENCOUNTER — Encounter (INDEPENDENT_AMBULATORY_CARE_PROVIDER_SITE_OTHER): Payer: Self-pay | Admitting: Internal Medicine

## 2008-06-30 ENCOUNTER — Ambulatory Visit: Payer: Self-pay | Admitting: Internal Medicine

## 2008-07-06 ENCOUNTER — Encounter (INDEPENDENT_AMBULATORY_CARE_PROVIDER_SITE_OTHER): Payer: Self-pay | Admitting: Internal Medicine

## 2008-07-09 IMAGING — CR DG FOOT COMPLETE 3+V*L*
3 series · 3 of 3 positions shown · non-contrast
Comparison: none

CLINICAL DATA: No known injury, lateral foot pain for 1 day. 
 LEFT FOOT ? 3 VIEW:

[view not recorded (1 of 3)]
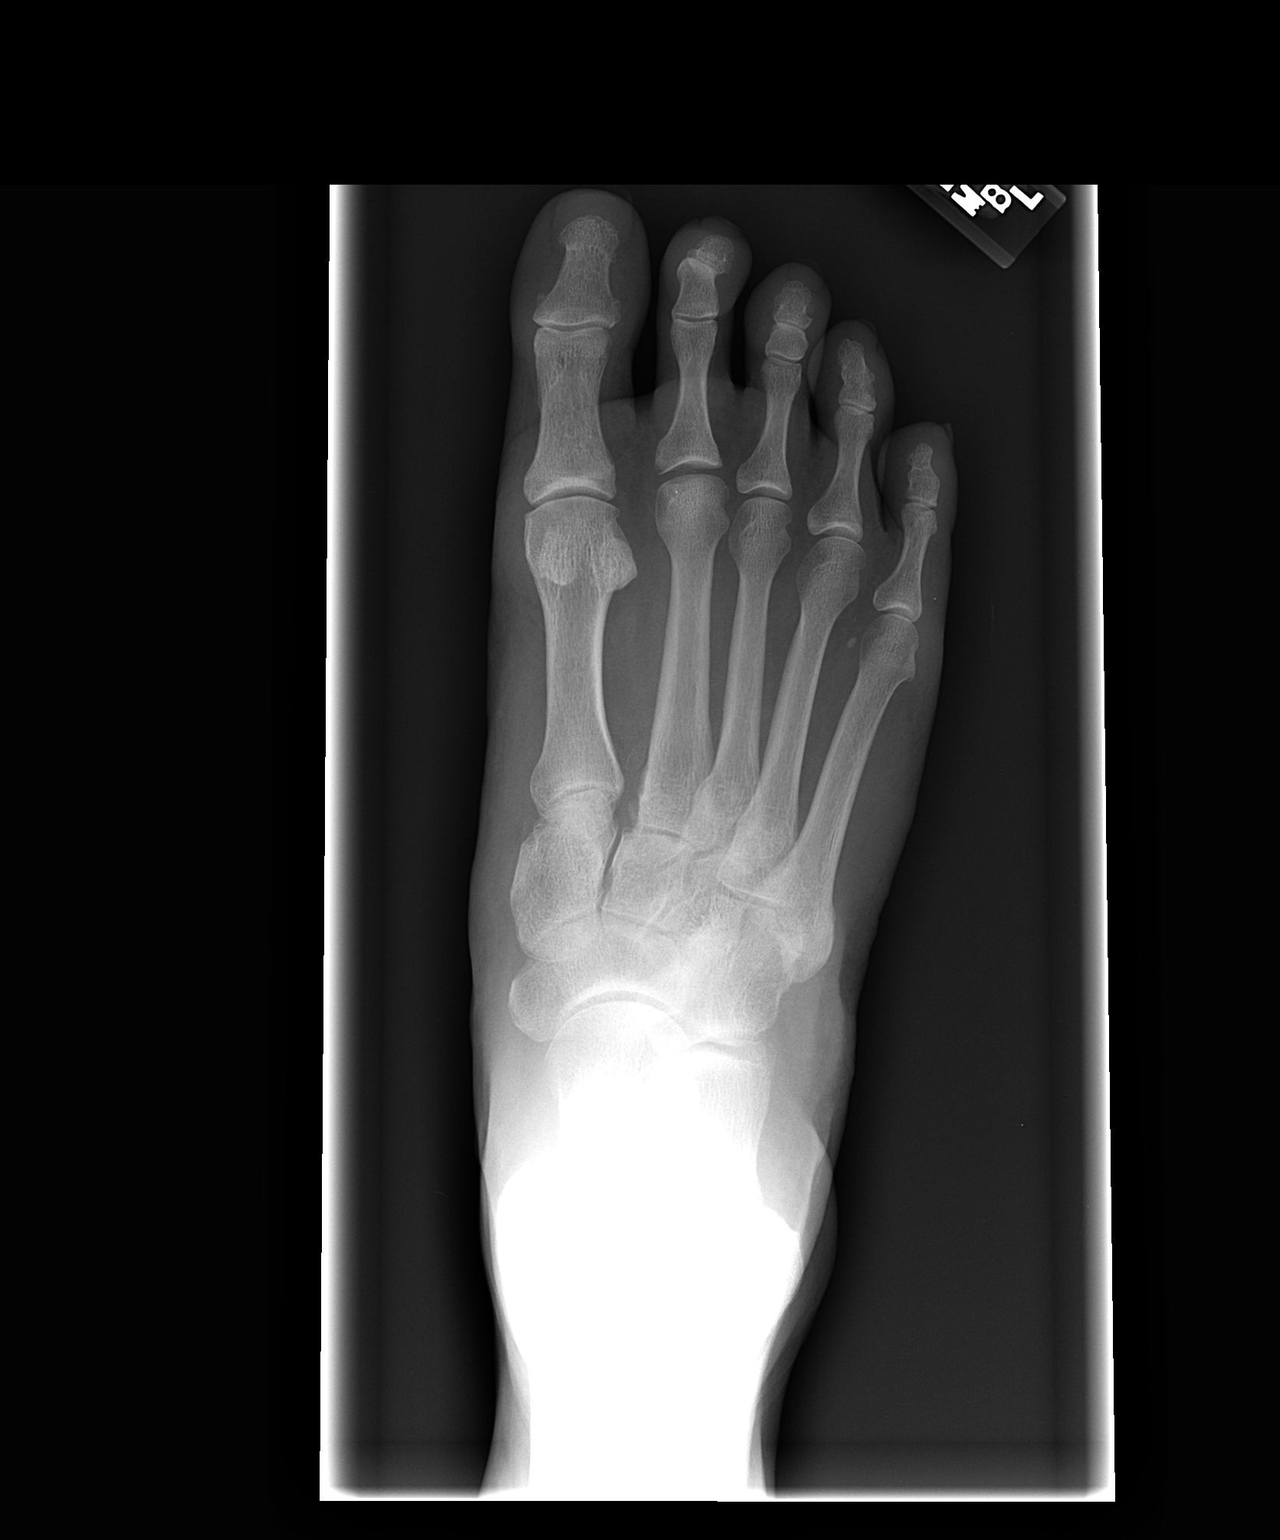

[view not recorded (2 of 3)]
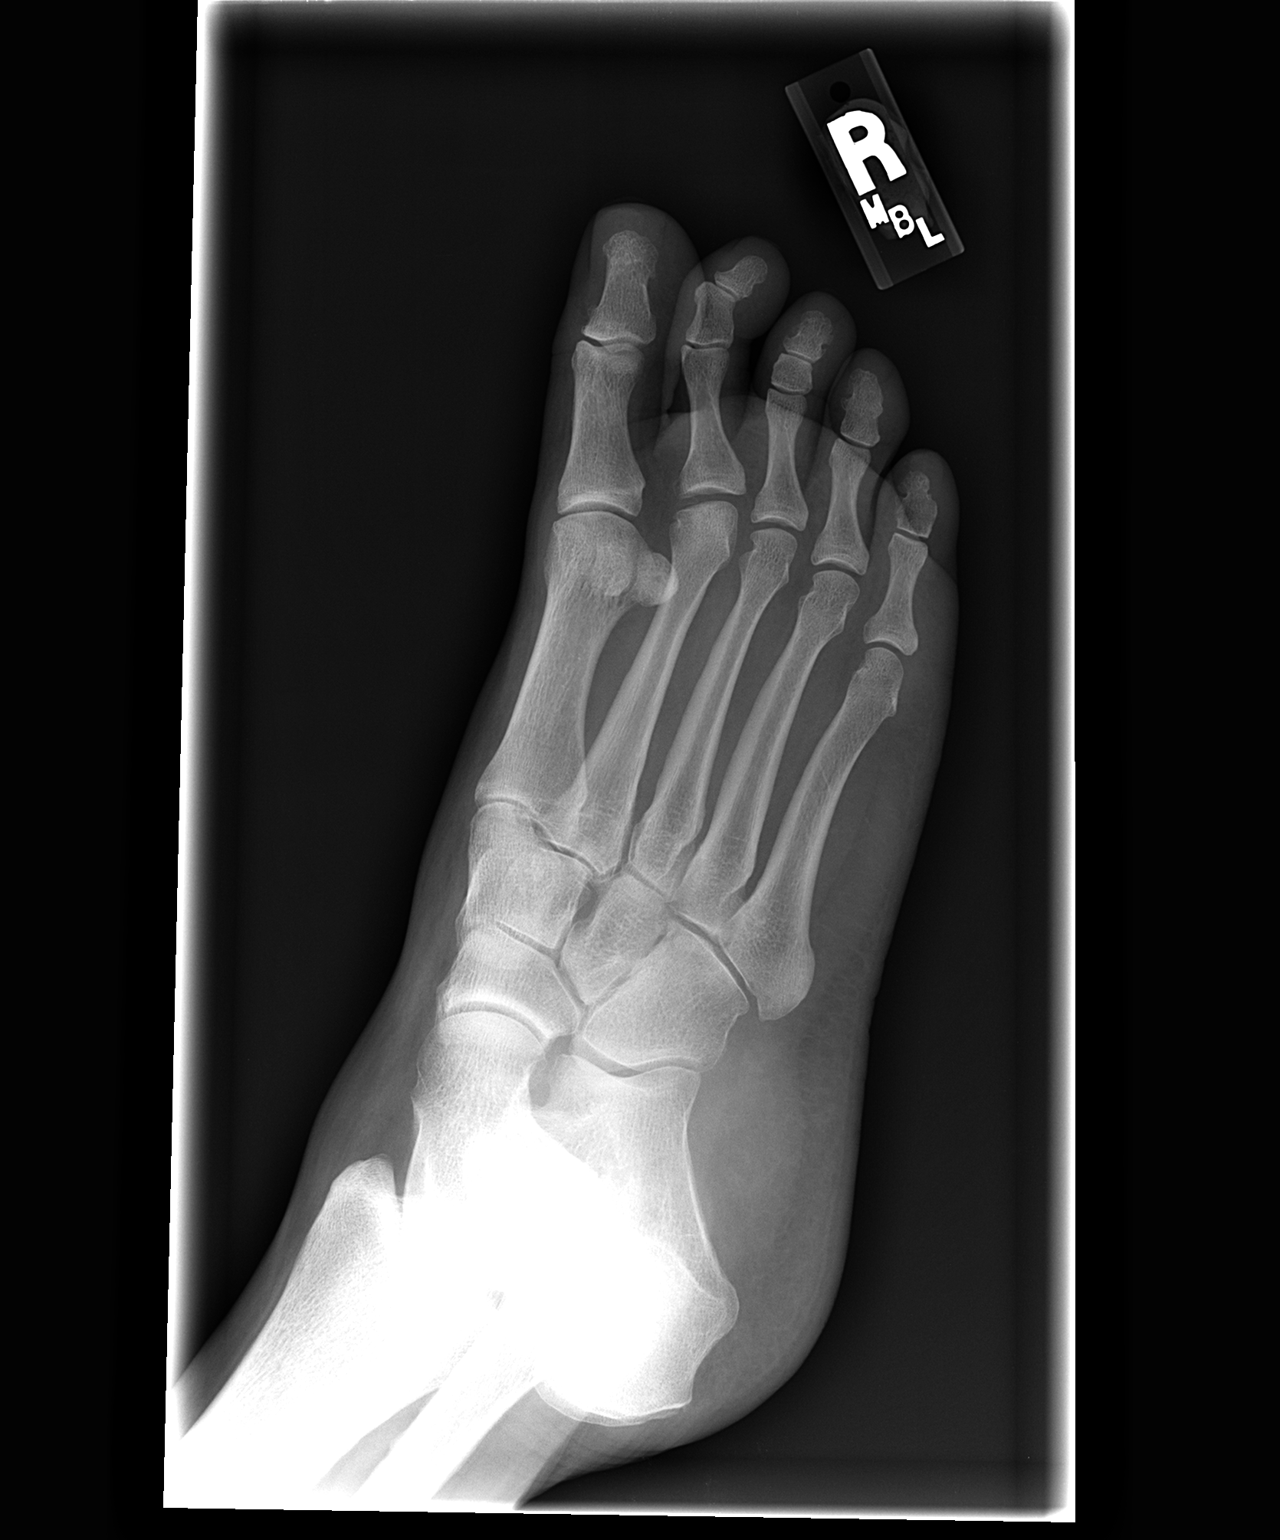

[view not recorded (3 of 3)]
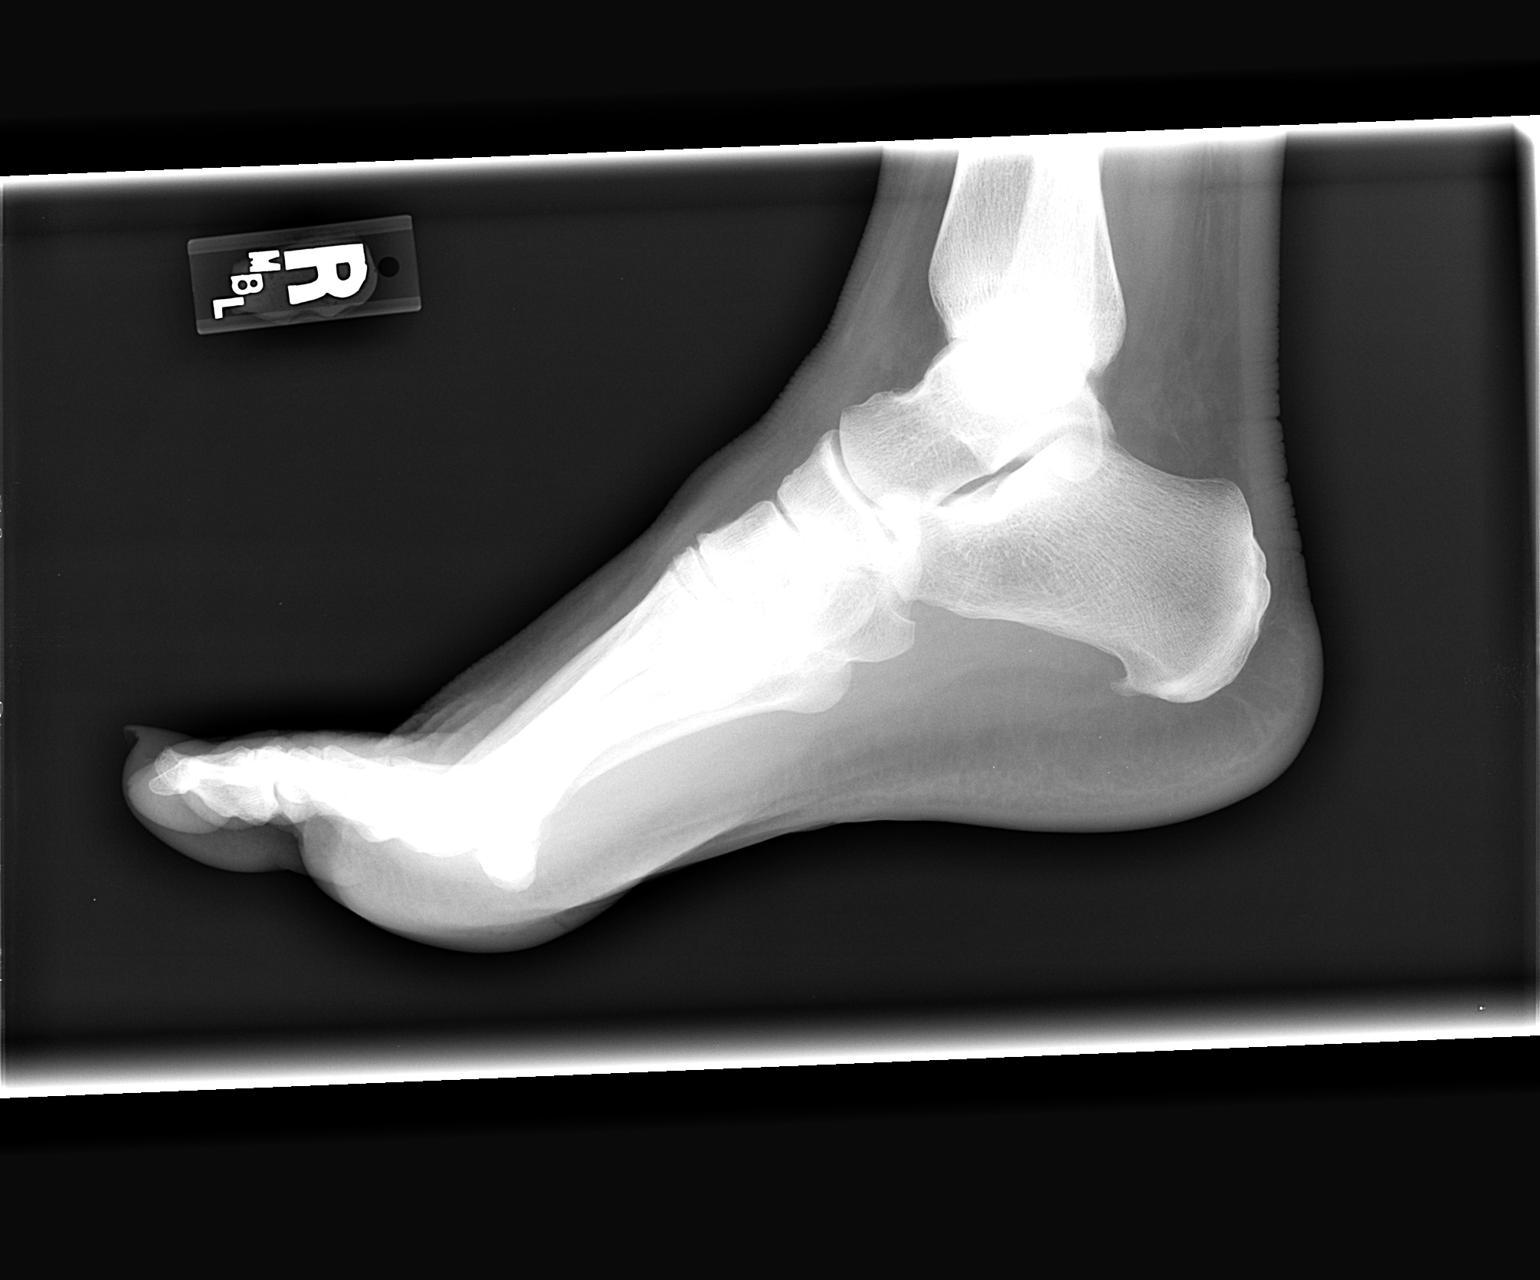

[3 of 3 positions shown; findings below may reference images not displayed]

FINDINGS: There is no evidence of fracture or dislocation.  There is no evidence of arthropathy or other focal bone abnormality.  Soft tissues are unremarkable.
IMPRESSION: Negative.

## 2008-07-14 LAB — CONVERTED CEMR LAB: Vit D, 1,25-Dihydroxy: 23 — ABNORMAL LOW (ref 30–89)

## 2008-07-25 ENCOUNTER — Encounter (INDEPENDENT_AMBULATORY_CARE_PROVIDER_SITE_OTHER): Payer: Self-pay | Admitting: *Deleted

## 2008-07-25 ENCOUNTER — Ambulatory Visit: Payer: Self-pay | Admitting: Internal Medicine

## 2008-07-25 DIAGNOSIS — J029 Acute pharyngitis, unspecified: Secondary | ICD-10-CM | POA: Insufficient documentation

## 2008-07-25 LAB — CONVERTED CEMR LAB: Streptococcus, Group A Screen (Direct): NEGATIVE

## 2008-08-30 ENCOUNTER — Encounter: Admission: RE | Admit: 2008-08-30 | Discharge: 2008-08-30 | Payer: Self-pay | Admitting: Internal Medicine

## 2008-08-30 LAB — HM MAMMOGRAPHY: HM Mammogram: NEGATIVE

## 2008-09-11 ENCOUNTER — Emergency Department (HOSPITAL_COMMUNITY): Admission: EM | Admit: 2008-09-11 | Discharge: 2008-09-11 | Payer: Self-pay | Admitting: Family Medicine

## 2008-09-22 IMAGING — MR MR CERVICAL SPINE W/O CM
6 of 14 series · 18 of 48 positions shown · IV contrast (agent unspecified)
Comparison: none

CLINICAL DATA: Numbness in the arms and neck.  Pain in the low back and legs.
 MRI CERVICAL SPINE WITHOUT CONTRAST:
TECHNIQUE: Multiplanar and multiecho pulse sequences of the cervical spine, to include the base of the skull and upper thoracic region, were obtained according to standard protocol without IV contrast.
TECHNIQUE: Multiplanar and multiecho pulse sequences of the lumbar spine, to include the lower thoracic and upper sacral regions, were obtained according to standard protocol without IV contrast.

[Series 2: T2 · sagittal · 3.0mm · 0.43mm/px · 2 of 12 slices shown (1 of 4)]
[im 1/12]
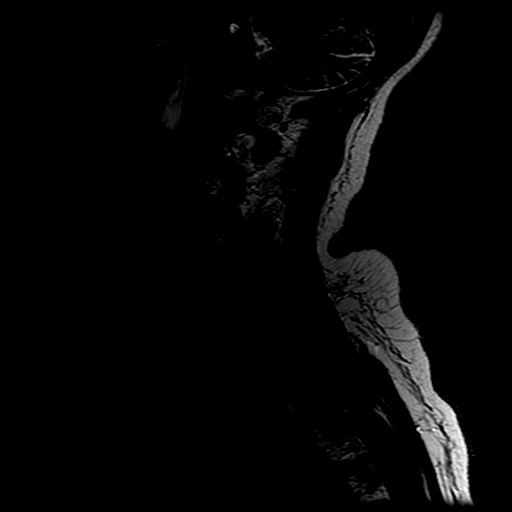
[im 12/12]
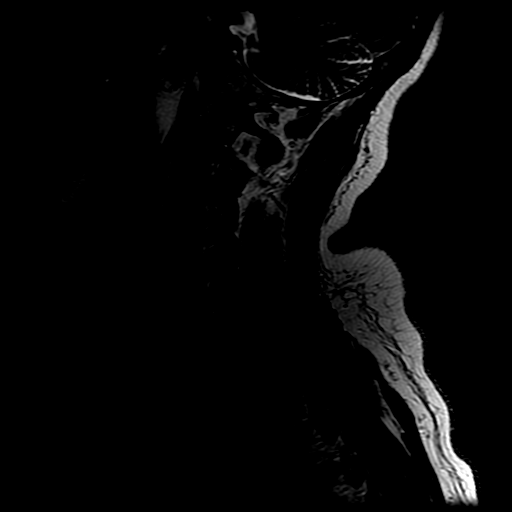

[Series 7: T2 · axial · 3.0mm · 0.39mm/px · z∈[-120,+31]mm · 4 of 20 slices shown (2 of 4)]
[im 1/20]
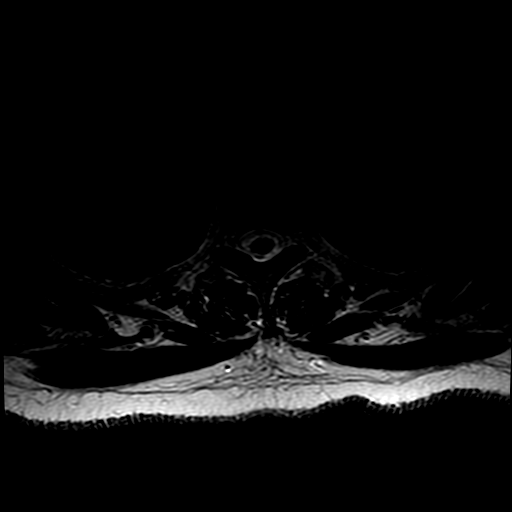
[im 7/20]
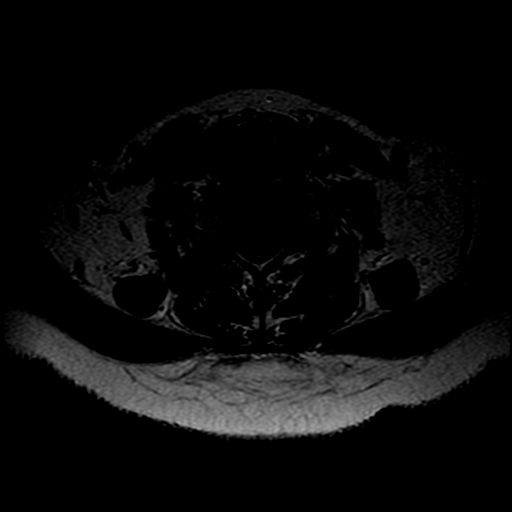
[im 13/20]
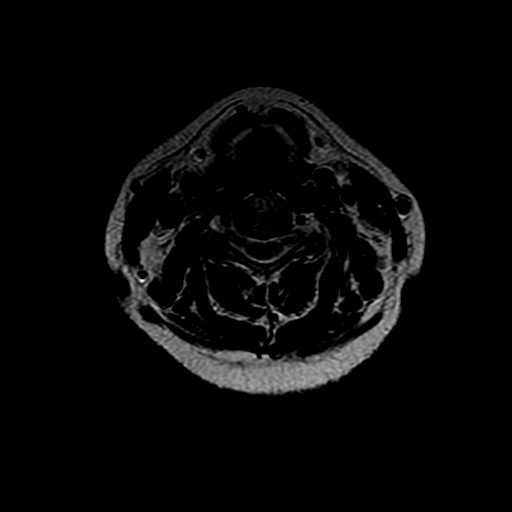
[im 20/20]
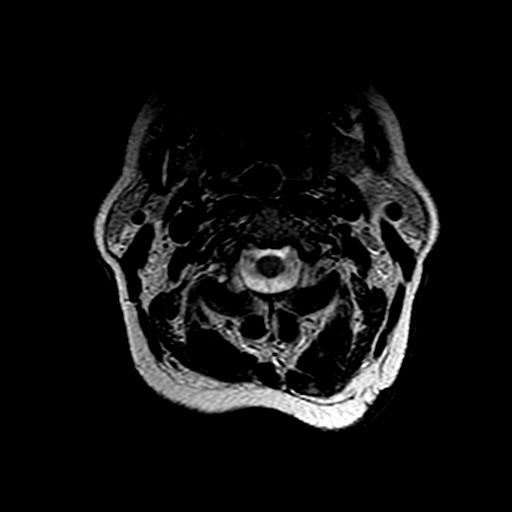

[Series 11: T2 · sagittal · 4.0mm · 0.59mm/px · 3 of 12 slices shown (3 of 4)]
[im 1/12]
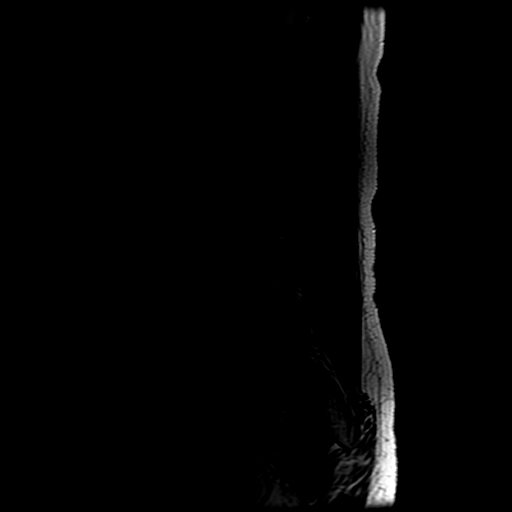
[im 6/12]
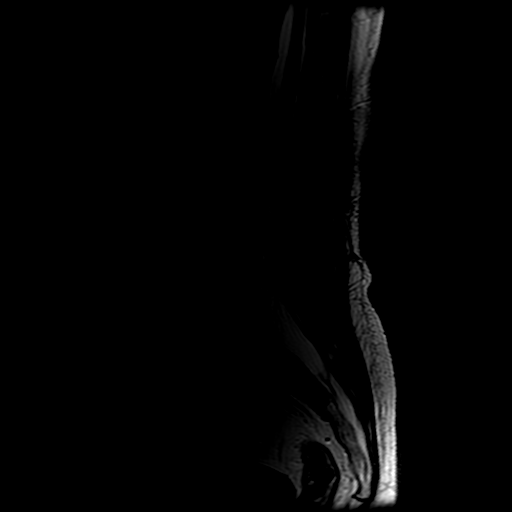
[im 12/12]
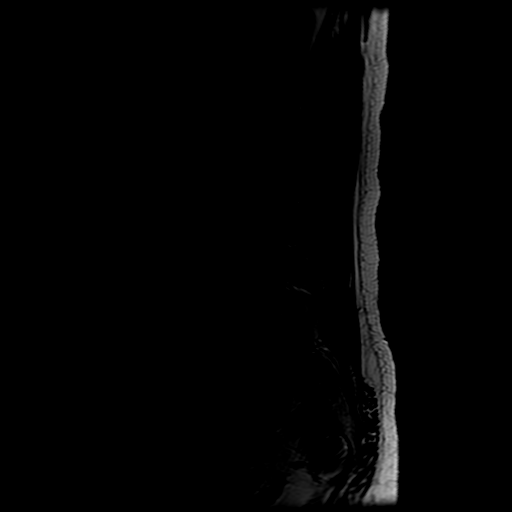

[Series 12: T1 · sagittal · 4.0mm · 0.59mm/px · 3 of 12 slices shown (1 of 2)]
[im 1/12]
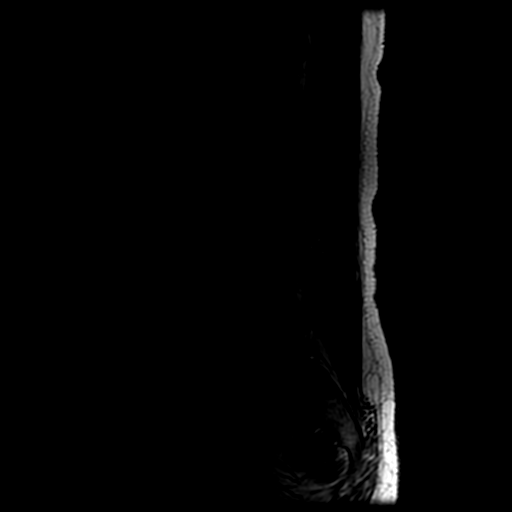
[im 6/12]
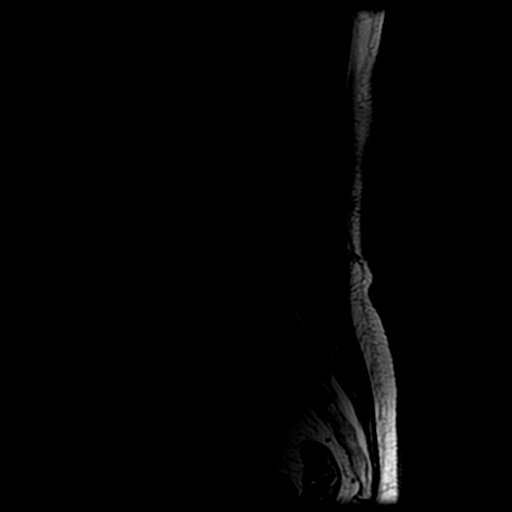
[im 12/12]
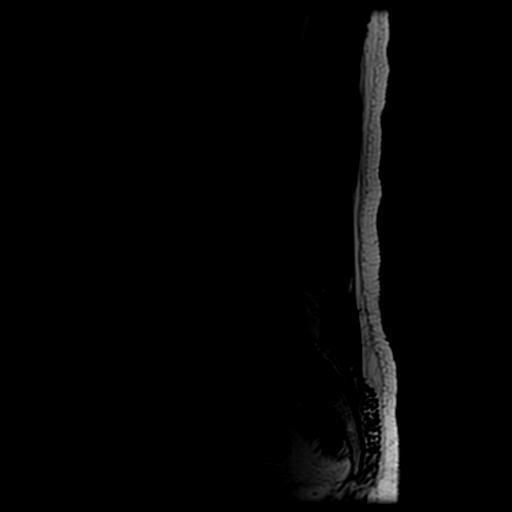

[Series 15: T2 · axial · 4.0mm · 0.35mm/px · z∈[-513,-271]mm · 5 of 24 slices shown (4 of 4)]
[im 1/24]
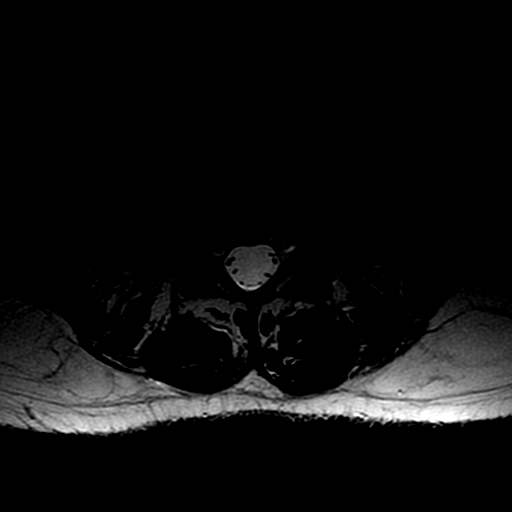
[im 6/24]
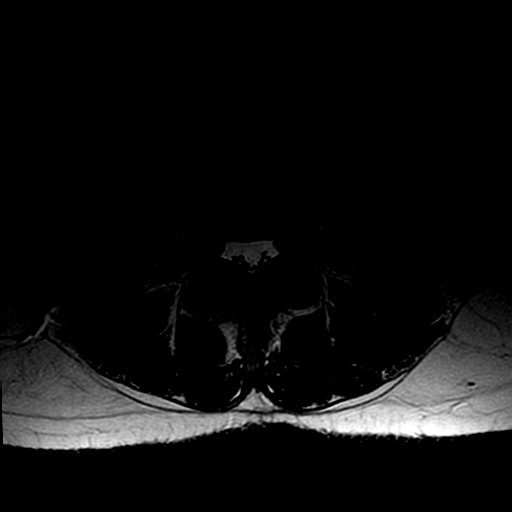
[im 12/24]
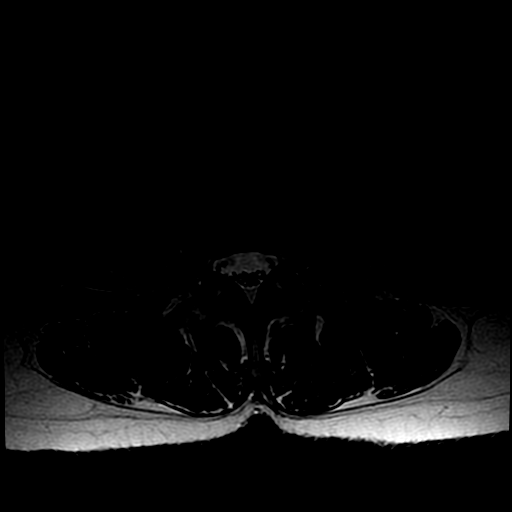
[im 18/24]
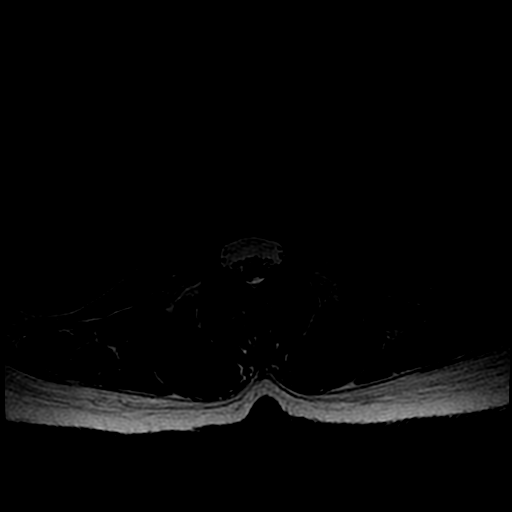
[im 24/24]
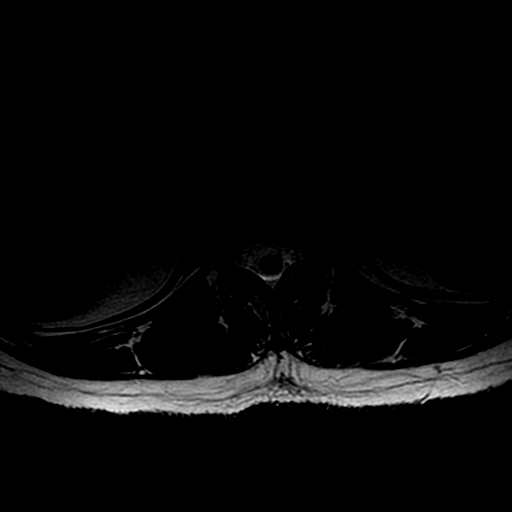

[Series 16: T1 · axial · 4.0mm · 0.35mm/px · 1 of 24 slices shown (2 of 2)]
[im 1/24]
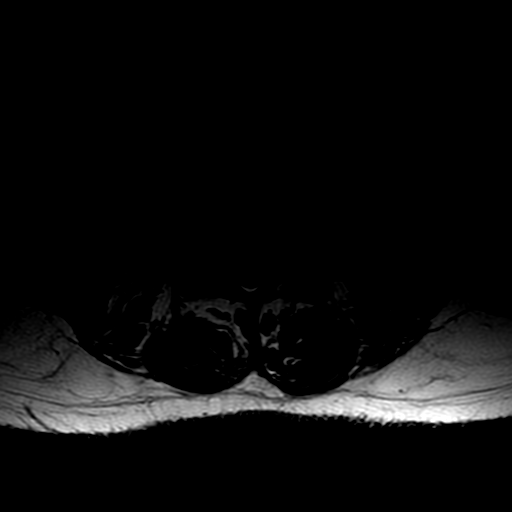

[18 of 48 positions shown; findings below may reference images not displayed]

FINDINGS: There is no abnormality at the foramen magnum, C1-2, or C2-3.
 At C3-4, there is a minimal disk bulge but no herniation or stenosis.
 At C4-5, there is a minimal disk bulge but no herniation or stenosis.
 At C5-6, there is mild spondylosis with end-plate osteophytes and shallow protrusion of disk material.  The ventral subarachnoid space is effaced.  No evidence of cord compression.  No abnormal cord signal.  There is mild osteophytic encroachment upon the foramina bilaterally.
 At C6-7, there is spondylosis with end-plate osteophytes and protruding disk material eccentrically more pronounced towards the left.  The ventral subarachnoid space is effaced but the cord does not appear compressed.  There is no abnormal T2 signal.  There is osteophytic encroachment upon the foramina, left more than right.  In particular, the left C7 nerve root could be compressed.
 At C7-T1 and T1-2, there is no abnormality.
IMPRESSION: 1.  Spondylosis at C6-7 eccentrically more pronounced towards the left.  Effacement of the subarachnoid space surrounding the cord but no cord compression.  Neural foraminal encroachment, left more than right.  In particular, the left C7 nerve root could be compressed.
 2.  C5-6:  Spondylosis with narrowing of the ventral subarachnoid space but no cord compression.  Mild foraminal encroachment bilaterally.
 3.  Minimal spondylosis at C3-4 and C4-5.
 MRI LUMBAR SPINE WITHOUT CONTRAST:
FINDINGS: The T12-L1 and L1-L2 disks are normal. 
 At L2-3, the disk bulges mildly but there is no herniation or stenosis. 
 At L3-4, the disk bulges mildly but there is no herniation or stenosis.
 At L4-5, there is a shallow disk protrusion that indents the thecal sac and narrows the lateral recesses mildly.  Definite neural compression is not established however.  
 At L5-S1, there is chronic disk degeneration with loss of height. There are end-plate osteophytes and there is mild bulging of the disk.  The facets are mildly hypertrophic.  There is mild narrowing of the subarticular lateral recesses but no definite neural compression.
IMPRESSION: 1.  Insignificant disk bulges at L2-3 and L3-4.
 2.  Shallow disk protrusion at L4-5 that indents the thecal sac and narrows the lateral recesses bilaterally.  Definite neural compression is not established however.   
 3.  L5-S1:   Chronic disk degeneration with end-plate osteophytes and bulging disk material.  Mild narrowing of the subarticular lateral recesses without definite neural compression.

## 2008-09-26 ENCOUNTER — Ambulatory Visit: Payer: Self-pay | Admitting: Internal Medicine

## 2008-09-26 DIAGNOSIS — R0602 Shortness of breath: Secondary | ICD-10-CM | POA: Insufficient documentation

## 2008-09-29 ENCOUNTER — Encounter (INDEPENDENT_AMBULATORY_CARE_PROVIDER_SITE_OTHER): Payer: Self-pay | Admitting: Internal Medicine

## 2008-09-29 ENCOUNTER — Ambulatory Visit: Payer: Self-pay | Admitting: Internal Medicine

## 2008-10-04 ENCOUNTER — Ambulatory Visit (HOSPITAL_COMMUNITY): Admission: RE | Admit: 2008-10-04 | Discharge: 2008-10-04 | Payer: Self-pay | Admitting: Neurology

## 2008-10-04 ENCOUNTER — Encounter: Payer: Self-pay | Admitting: Pulmonary Disease

## 2008-10-04 ENCOUNTER — Encounter (INDEPENDENT_AMBULATORY_CARE_PROVIDER_SITE_OTHER): Payer: Self-pay | Admitting: Internal Medicine

## 2008-10-05 ENCOUNTER — Ambulatory Visit (HOSPITAL_COMMUNITY): Admission: RE | Admit: 2008-10-05 | Discharge: 2008-10-05 | Payer: Self-pay | Admitting: Internal Medicine

## 2008-10-05 LAB — CONVERTED CEMR LAB
BUN: 14 mg/dL (ref 6–23)
CO2: 25 meq/L (ref 19–32)
Calcium: 9.7 mg/dL (ref 8.4–10.5)
Chloride: 108 meq/L (ref 96–112)
Creatinine, Ser: 0.6 mg/dL (ref 0.40–1.20)
GFR calc Af Amer: >60 mL/min (ref >60)
GFR calc non Af Amer: >60 mL/min (ref >60)
Glucose, Bld: 132 mg/dL — ABNORMAL HIGH (ref 70–99)
Potassium: 4.3 meq/L (ref 3.5–5.3)
Sodium: 142 meq/L (ref 135–145)

## 2008-10-07 ENCOUNTER — Ambulatory Visit: Payer: Self-pay | Admitting: Infectious Disease

## 2008-10-07 ENCOUNTER — Encounter (INDEPENDENT_AMBULATORY_CARE_PROVIDER_SITE_OTHER): Payer: Self-pay | Admitting: Internal Medicine

## 2008-10-07 LAB — CONVERTED CEMR LAB: Hgb A1c MFr Bld: 6 %

## 2008-10-18 LAB — CONVERTED CEMR LAB
BUN: 13 mg/dL (ref 6–23)
CO2: 27 meq/L (ref 19–32)
Calcium: 9.4 mg/dL (ref 8.4–10.5)
Chloride: 109 meq/L (ref 96–112)
Creatinine, Ser: 0.77 mg/dL (ref 0.40–1.20)
GFR calc Af Amer: >60 mL/min (ref >60)
GFR calc non Af Amer: >60 mL/min (ref >60)
Glucose, Bld: 117 mg/dL — ABNORMAL HIGH (ref 70–99)
Potassium: 4.5 meq/L (ref 3.5–5.3)
Sodium: 141 meq/L (ref 135–145)

## 2008-10-26 ENCOUNTER — Emergency Department (HOSPITAL_COMMUNITY): Admission: EM | Admit: 2008-10-26 | Discharge: 2008-10-26 | Payer: Self-pay | Admitting: Family Medicine

## 2008-10-27 ENCOUNTER — Emergency Department (HOSPITAL_COMMUNITY): Admission: EM | Admit: 2008-10-27 | Discharge: 2008-10-27 | Payer: Self-pay | Admitting: Emergency Medicine

## 2008-10-27 ENCOUNTER — Ambulatory Visit: Payer: Self-pay | Admitting: *Deleted

## 2008-10-27 DIAGNOSIS — R42 Dizziness and giddiness: Secondary | ICD-10-CM | POA: Insufficient documentation

## 2008-10-27 LAB — CONVERTED CEMR LAB
Bilirubin Urine: NEGATIVE
Blood in Urine, dipstick: NEGATIVE
Glucose, Urine, Semiquant: NEGATIVE
Ketones, urine, test strip: NEGATIVE
Nitrite: NEGATIVE
Protein, U semiquant: NEGATIVE
Specific Gravity, Urine: <1.005
Urobilinogen, UA: 0.2
WBC Urine, dipstick: NEGATIVE
pH: 7

## 2008-10-30 ENCOUNTER — Emergency Department (HOSPITAL_COMMUNITY): Admission: EM | Admit: 2008-10-30 | Discharge: 2008-10-30 | Payer: Self-pay | Admitting: Family Medicine

## 2008-11-02 ENCOUNTER — Ambulatory Visit: Payer: Self-pay | Admitting: Internal Medicine

## 2008-11-03 ENCOUNTER — Ambulatory Visit (HOSPITAL_COMMUNITY): Admission: RE | Admit: 2008-11-03 | Discharge: 2008-11-03 | Payer: Self-pay | Admitting: *Deleted

## 2008-11-03 ENCOUNTER — Encounter (INDEPENDENT_AMBULATORY_CARE_PROVIDER_SITE_OTHER): Payer: Self-pay | Admitting: Internal Medicine

## 2008-11-09 ENCOUNTER — Ambulatory Visit: Payer: Self-pay | Admitting: Internal Medicine

## 2008-11-24 ENCOUNTER — Ambulatory Visit: Payer: Self-pay | Admitting: Pulmonary Disease

## 2008-11-24 DIAGNOSIS — J438 Other emphysema: Secondary | ICD-10-CM | POA: Insufficient documentation

## 2008-11-28 ENCOUNTER — Encounter (INDEPENDENT_AMBULATORY_CARE_PROVIDER_SITE_OTHER): Payer: Self-pay | Admitting: Internal Medicine

## 2008-11-28 ENCOUNTER — Ambulatory Visit: Payer: Self-pay | Admitting: Internal Medicine

## 2008-11-28 DIAGNOSIS — E559 Vitamin D deficiency, unspecified: Secondary | ICD-10-CM | POA: Insufficient documentation

## 2008-12-03 LAB — CONVERTED CEMR LAB
ALT: 16 U/L
AST: 16 U/L
Albumin: 4.1 g/dL
Alkaline Phosphatase: 72 U/L
BUN: 13 mg/dL
CO2: 26 meq/L
Calcium: 9.4 mg/dL
Chloride: 107 meq/L
Cholesterol: 183 mg/dL
Creatinine, Ser: 0.75 mg/dL
Glucose, Bld: 93 mg/dL
HDL: 31 mg/dL — ABNORMAL LOW
LDL Cholesterol: 107 mg/dL — ABNORMAL HIGH
Potassium: 4.6 meq/L
Sodium: 142 meq/L
Total Bilirubin: 0.4 mg/dL
Total CHOL/HDL Ratio: 5.9
Total Protein: 6.5 g/dL
Triglycerides: 226 mg/dL — ABNORMAL HIGH
VLDL: 45 mg/dL — ABNORMAL HIGH
Vit D, 25-Hydroxy: 37 ng/mL

## 2009-01-10 ENCOUNTER — Telehealth: Payer: Self-pay | Admitting: *Deleted

## 2009-01-11 ENCOUNTER — Encounter (INDEPENDENT_AMBULATORY_CARE_PROVIDER_SITE_OTHER): Payer: Self-pay | Admitting: Internal Medicine

## 2009-01-11 ENCOUNTER — Ambulatory Visit: Payer: Self-pay | Admitting: Internal Medicine

## 2009-01-11 DIAGNOSIS — R233 Spontaneous ecchymoses: Secondary | ICD-10-CM | POA: Insufficient documentation

## 2009-01-11 LAB — CONVERTED CEMR LAB
Basophils Absolute: 0 10*3/uL (ref 0.0–0.1)
Basophils Relative: 0 % (ref 0–1)
Eosinophils Absolute: 0.1 10*3/uL (ref 0.0–0.7)
Eosinophils Relative: 2 % (ref 0–5)
HCT: 41.5 % (ref 36.0–46.0)
Hemoglobin: 13.9 g/dL (ref 12.0–15.0)
INR: 1 (ref 0.0–1.5)
Lymphocytes Relative: 23 % (ref 12–46)
Lymphs Abs: 1.2 10*3/uL (ref 0.7–4.0)
MCHC: 33.5 g/dL (ref 30.0–36.0)
MCV: 88.2 fL (ref >=78.0)
Monocytes Absolute: 0.4 10*3/uL (ref 0.1–1.0)
Monocytes Relative: 8 % (ref 3–12)
Neutro Abs: 3.4 10*3/uL (ref 1.7–7.7)
Neutrophils Relative %: 67 % (ref 43–77)
Platelets: 206 10*3/uL (ref 150–400)
Prothrombin Time: 12.9 s (ref 11.6–15.2)
RBC: 4.71 M/uL (ref 3.87–5.11)
RDW: 14.1 % (ref 11.5–15.5)
WBC: 5.1 10*3/uL (ref 4.0–10.5)
aPTT: 29 s (ref 24–37)

## 2009-02-17 ENCOUNTER — Emergency Department (HOSPITAL_COMMUNITY): Admission: EM | Admit: 2009-02-17 | Discharge: 2009-02-17 | Payer: Self-pay | Admitting: Family Medicine

## 2009-02-20 ENCOUNTER — Ambulatory Visit: Payer: Self-pay | Admitting: Internal Medicine

## 2009-02-20 DIAGNOSIS — B3731 Acute candidiasis of vulva and vagina: Secondary | ICD-10-CM | POA: Insufficient documentation

## 2009-02-20 DIAGNOSIS — B373 Candidiasis of vulva and vagina: Secondary | ICD-10-CM | POA: Insufficient documentation

## 2009-02-20 DIAGNOSIS — R6882 Decreased libido: Secondary | ICD-10-CM | POA: Insufficient documentation

## 2009-03-22 ENCOUNTER — Encounter (INDEPENDENT_AMBULATORY_CARE_PROVIDER_SITE_OTHER): Payer: Self-pay | Admitting: Internal Medicine

## 2009-04-13 ENCOUNTER — Emergency Department (HOSPITAL_COMMUNITY): Admission: EM | Admit: 2009-04-13 | Discharge: 2009-04-14 | Payer: Self-pay | Admitting: Emergency Medicine

## 2009-04-20 ENCOUNTER — Ambulatory Visit: Payer: Self-pay | Admitting: Internal Medicine

## 2009-05-02 ENCOUNTER — Telehealth: Payer: Self-pay | Admitting: Licensed Clinical Social Worker

## 2009-05-26 ENCOUNTER — Emergency Department (HOSPITAL_COMMUNITY): Admission: EM | Admit: 2009-05-26 | Discharge: 2009-05-26 | Payer: Self-pay | Admitting: Emergency Medicine

## 2009-05-31 ENCOUNTER — Telehealth: Payer: Self-pay | Admitting: *Deleted

## 2009-06-05 ENCOUNTER — Encounter (INDEPENDENT_AMBULATORY_CARE_PROVIDER_SITE_OTHER): Payer: Self-pay | Admitting: Internal Medicine

## 2009-06-05 ENCOUNTER — Ambulatory Visit (HOSPITAL_COMMUNITY): Admission: RE | Admit: 2009-06-05 | Discharge: 2009-06-05 | Payer: Self-pay | Admitting: Internal Medicine

## 2009-06-05 ENCOUNTER — Ambulatory Visit: Payer: Self-pay | Admitting: Internal Medicine

## 2009-06-05 DIAGNOSIS — R079 Chest pain, unspecified: Secondary | ICD-10-CM | POA: Insufficient documentation

## 2009-06-28 ENCOUNTER — Ambulatory Visit: Payer: Self-pay | Admitting: Internal Medicine

## 2009-06-28 DIAGNOSIS — F17200 Nicotine dependence, unspecified, uncomplicated: Secondary | ICD-10-CM | POA: Insufficient documentation

## 2009-06-28 DIAGNOSIS — B009 Herpesviral infection, unspecified: Secondary | ICD-10-CM | POA: Insufficient documentation

## 2009-06-28 LAB — CONVERTED CEMR LAB
Hgb A1c MFr Bld: 6.2 %
Pap Smear: NEGATIVE

## 2009-06-28 LAB — HM PAP SMEAR: HM Pap smear: NORMAL

## 2009-07-12 IMAGING — CR DG LUMBAR SPINE COMPLETE 4+V
5 series · 5 of 5 positions shown · non-contrast
Comparison: 02/01/06.

CLINICAL DATA: Fall, pain.  
 LUMBAR SPINE ? 5 VIEW:

[view not recorded (1 of 5)]
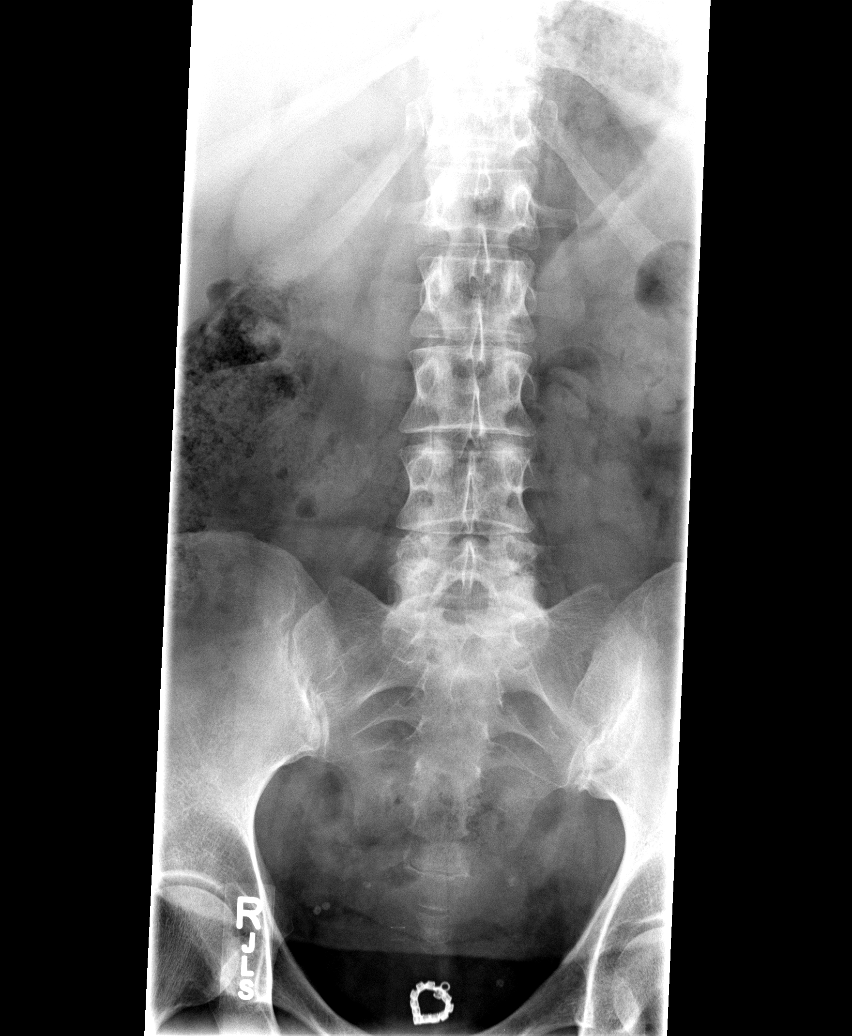

[view not recorded (2 of 5)]
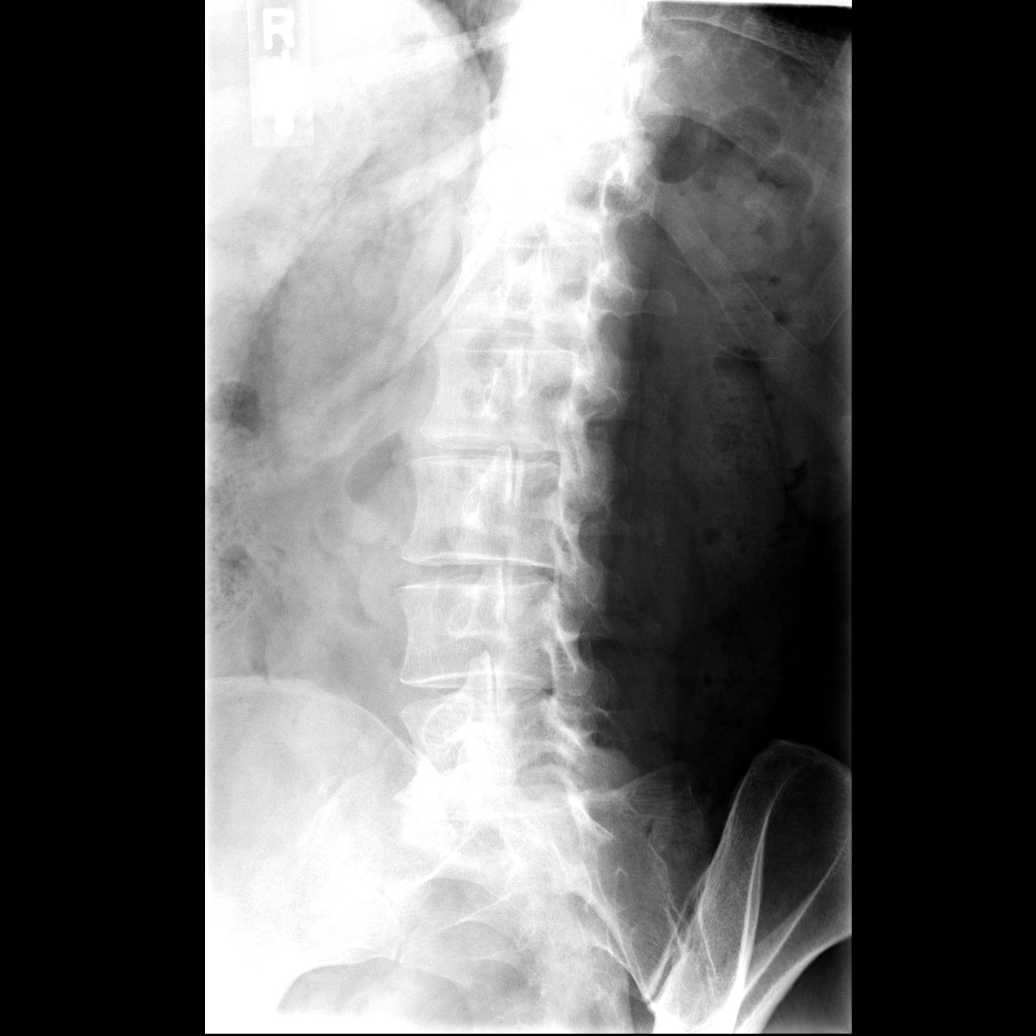

[view not recorded (3 of 5)]
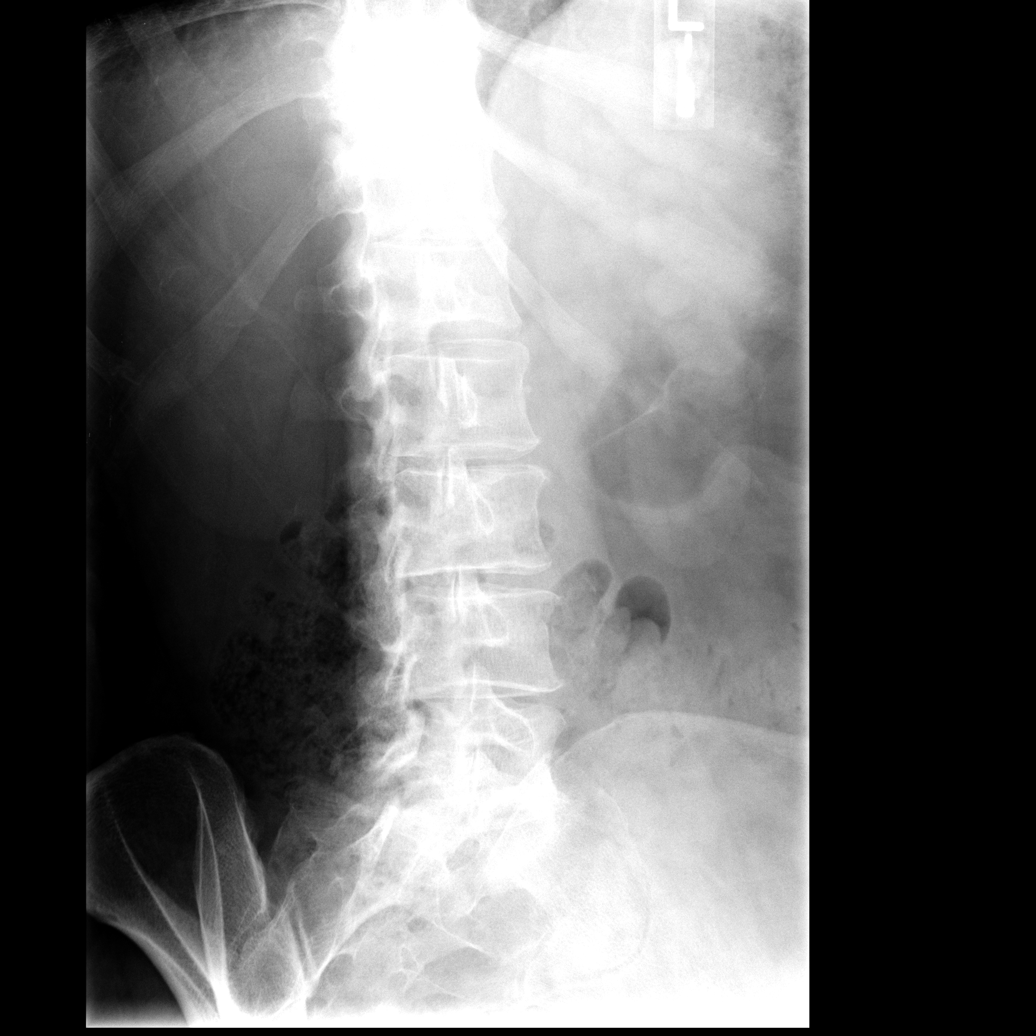

[view not recorded (4 of 5)]
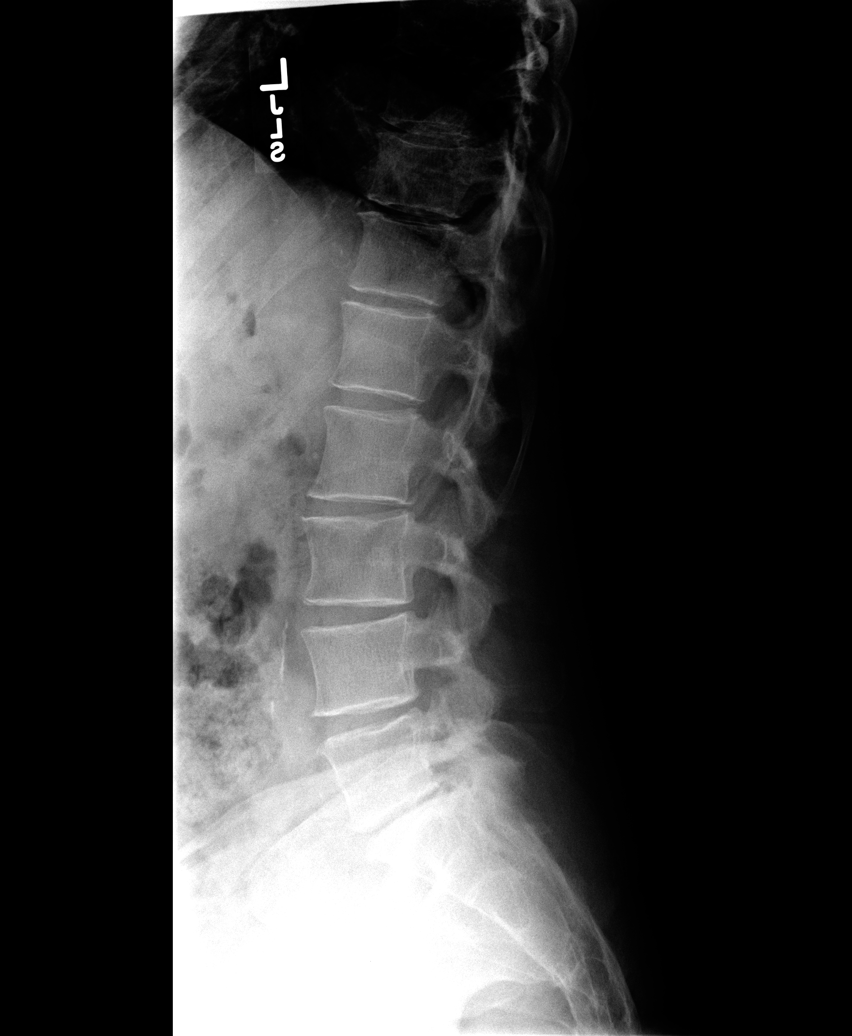

[view not recorded (5 of 5)]
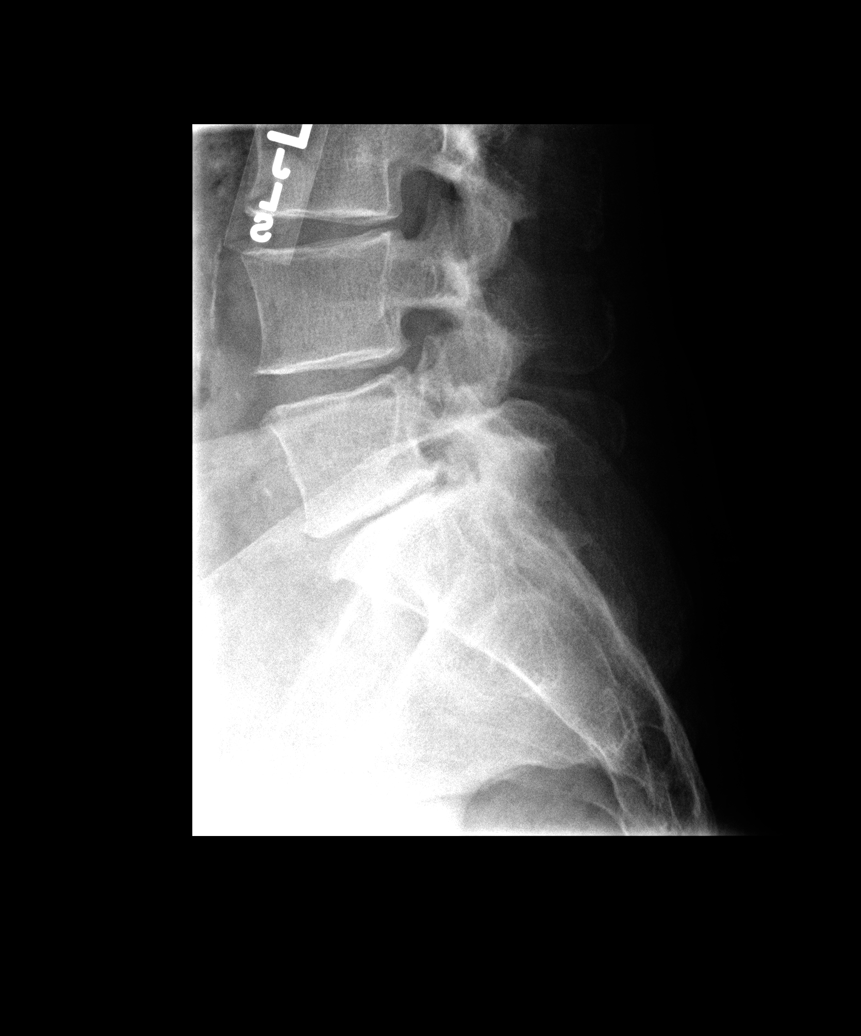

[5 of 5 positions shown; findings below may reference images not displayed]

FINDINGS: Vertebral body height and alignment are maintained.  The patient has multilevel degenerative disk disease with loss of disk space height at all levels of the lumbar spine.  Facet degenerative change appears worse at L4-5 and L5-S1.
IMPRESSION: No acute finding with degenerative disease noted.

## 2009-07-12 IMAGING — CR DG FOOT COMPLETE 3+V*R*
3 series · 3 of 3 positions shown · non-contrast
Comparison: 09/05/05.

CLINICAL DATA: Fall, pain. 
 RIGHT FOOT ? 3 VIEW:

[view not recorded (1 of 3)]
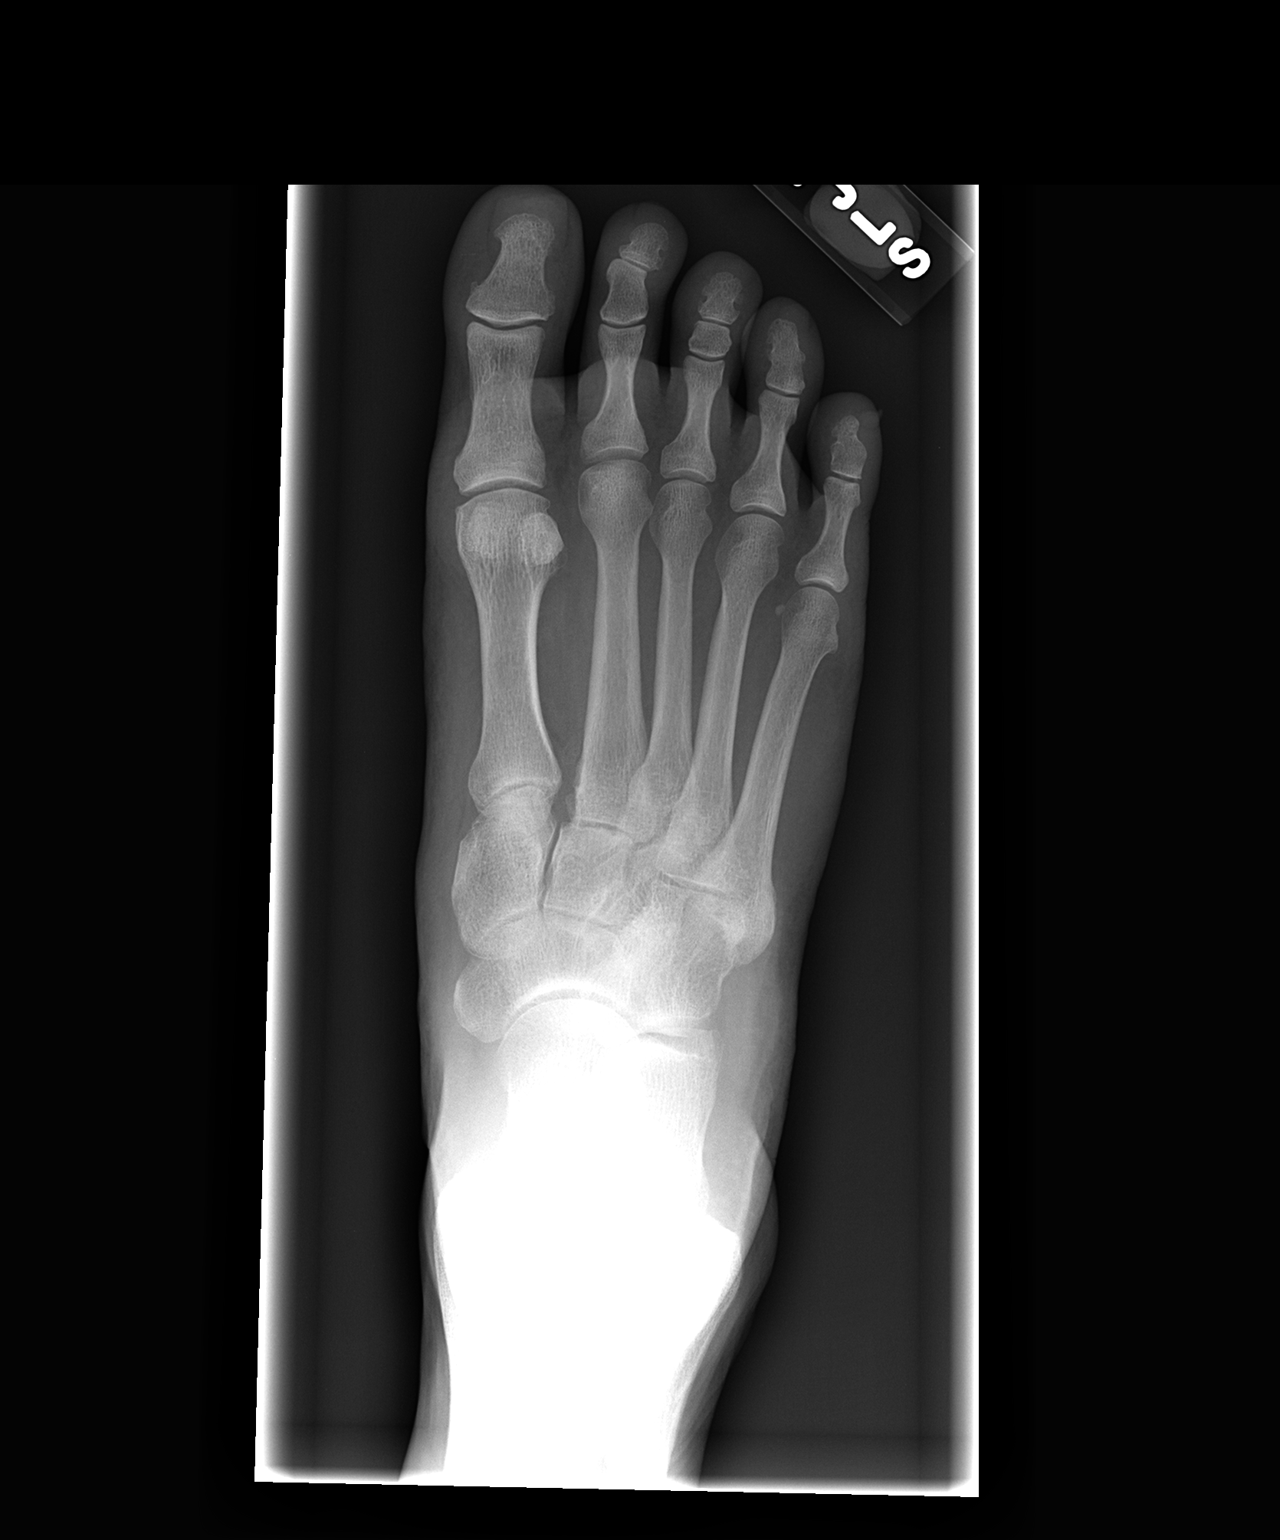

[view not recorded (2 of 3)]
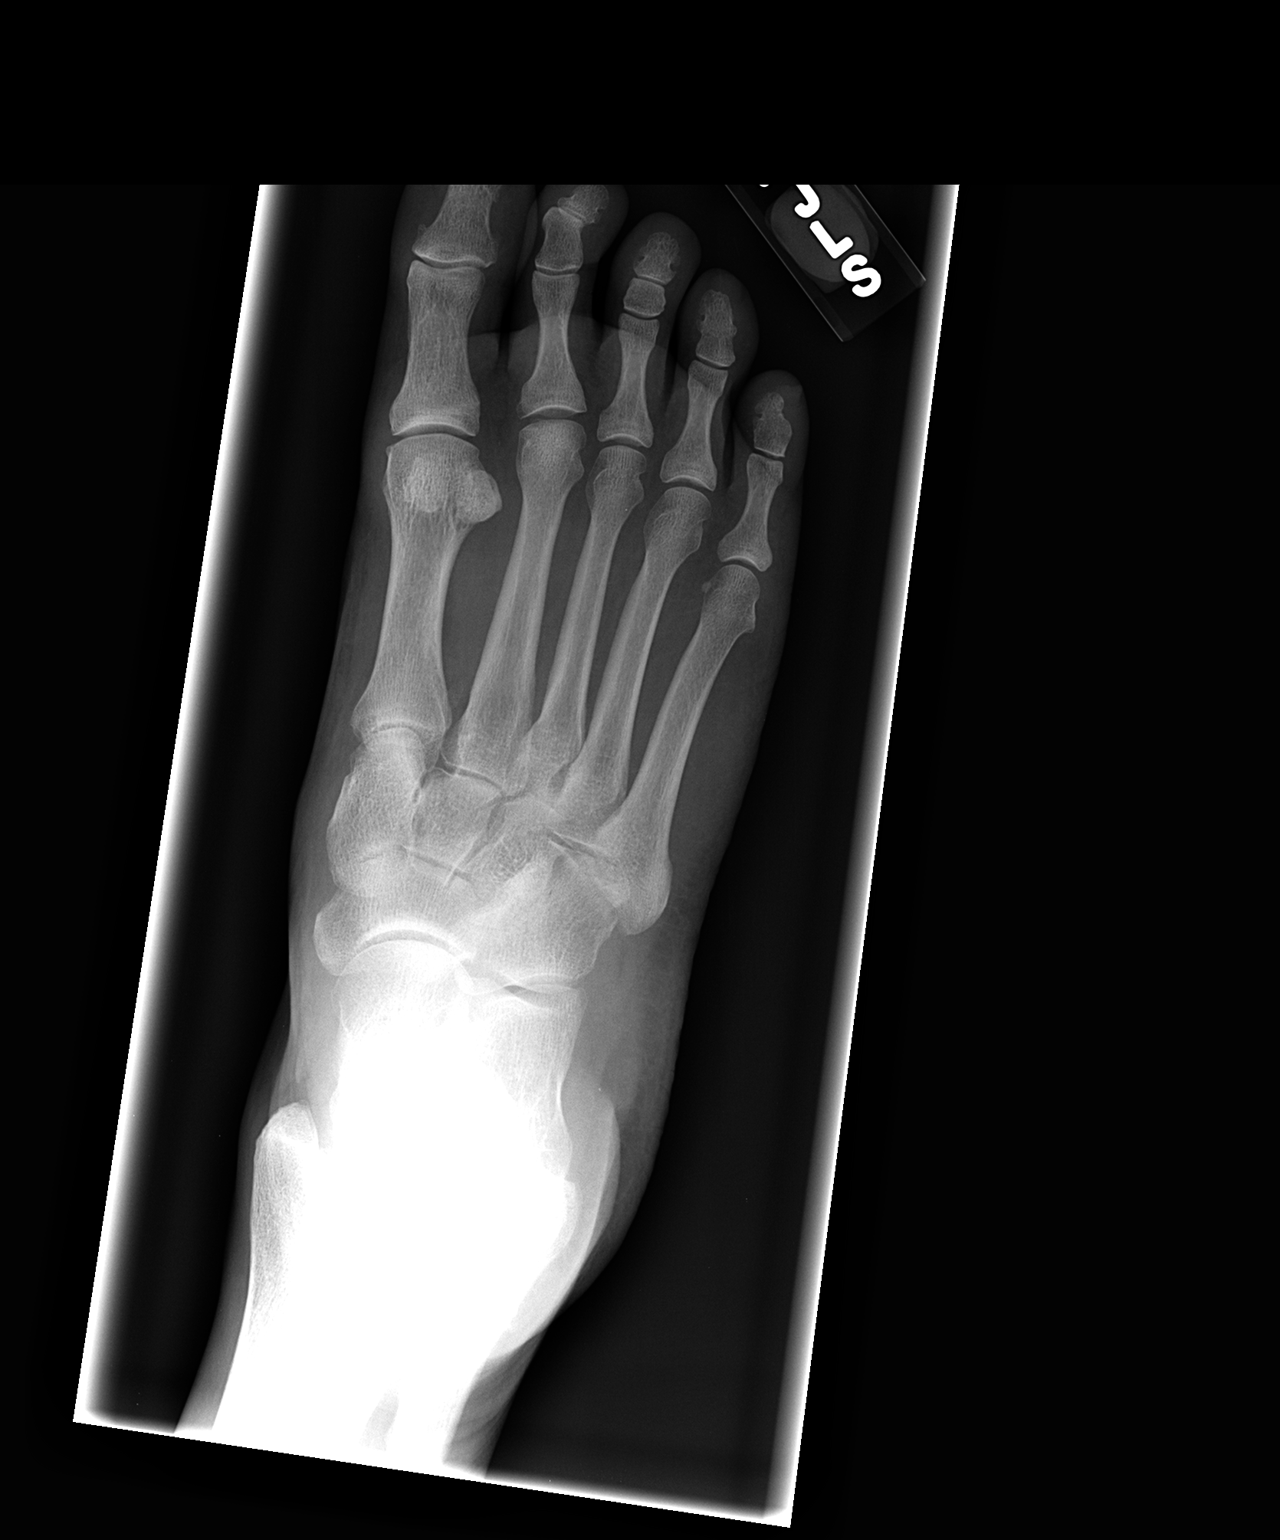

[view not recorded (3 of 3)]
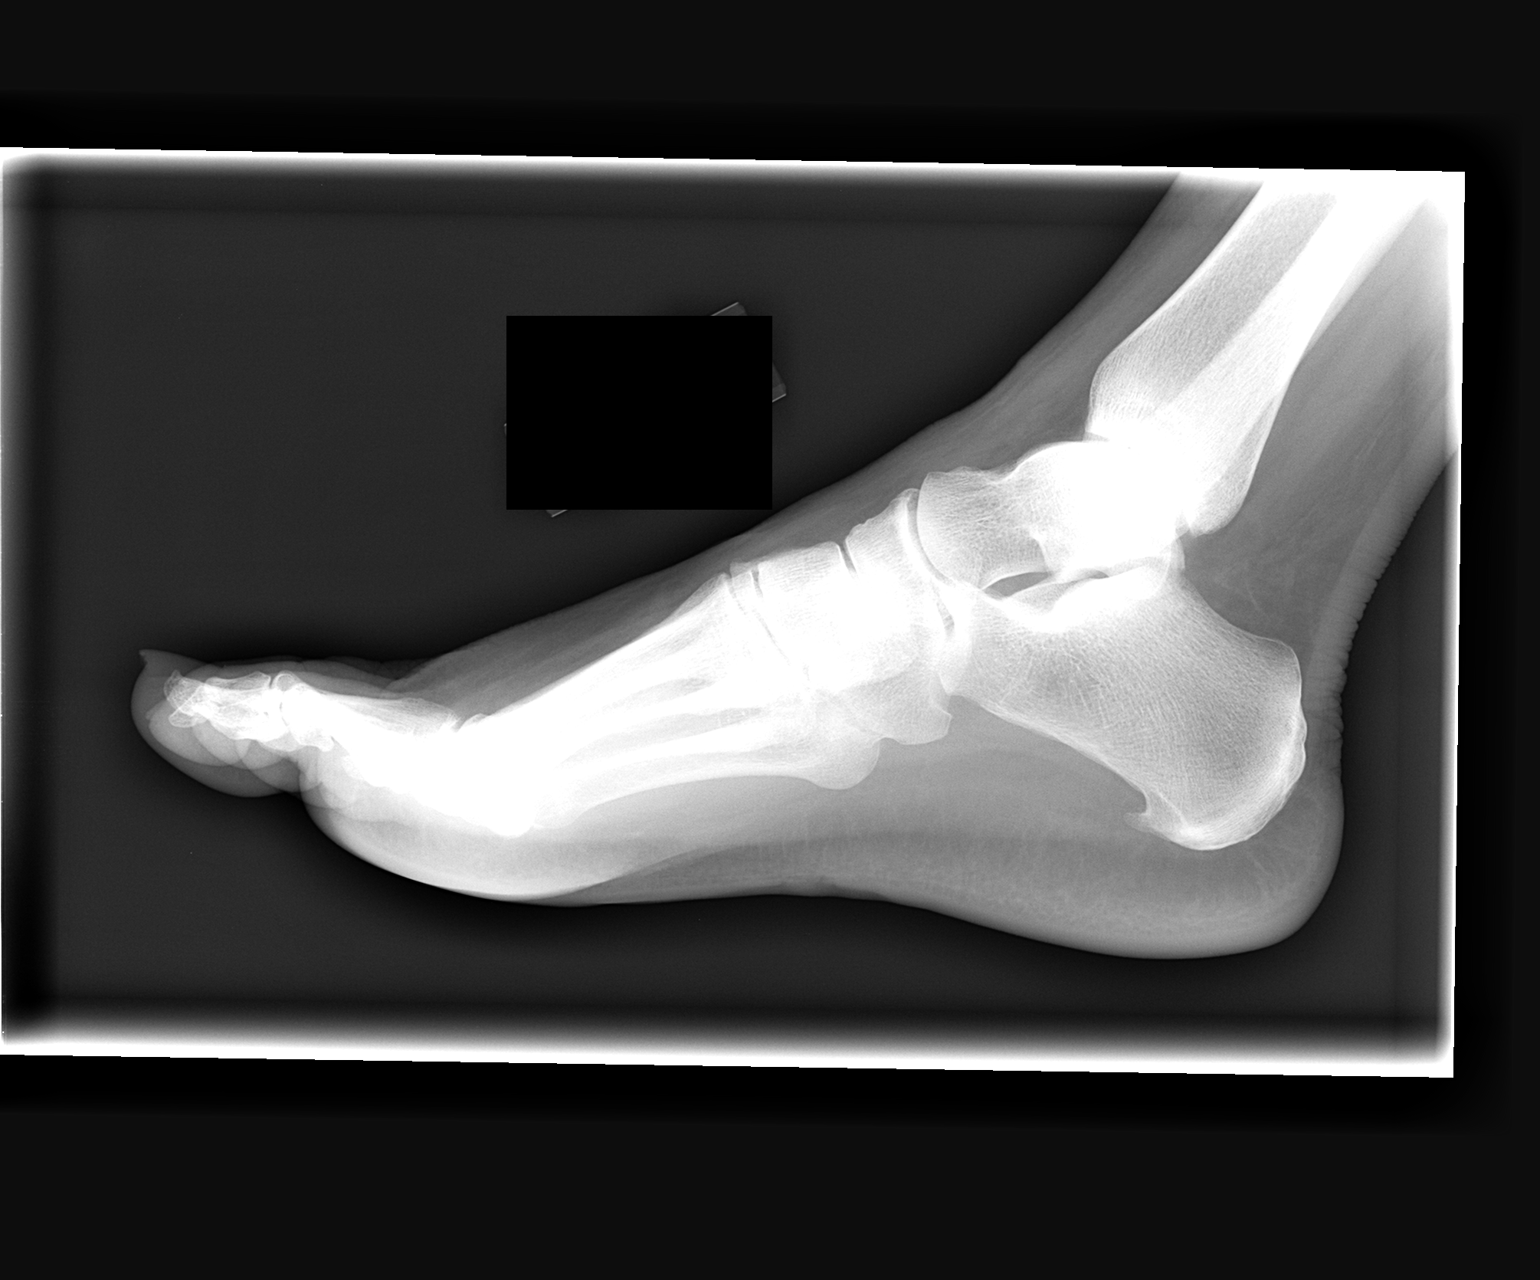

[3 of 3 positions shown; findings below may reference images not displayed]

FINDINGS: Imaged bones, joints, and soft tissues demonstrate no acute abnormality.  Small plantar spur noted.
IMPRESSION: No acute finding.  Stable exam.

## 2009-07-19 ENCOUNTER — Encounter (INDEPENDENT_AMBULATORY_CARE_PROVIDER_SITE_OTHER): Payer: Self-pay | Admitting: Internal Medicine

## 2009-07-19 ENCOUNTER — Emergency Department (HOSPITAL_COMMUNITY): Admission: EM | Admit: 2009-07-19 | Discharge: 2009-07-19 | Payer: Self-pay | Admitting: Emergency Medicine

## 2009-08-01 IMAGING — CR DG CHEST 2V
2 series · 2 of 2 positions shown · non-contrast
Comparison: 10/17/2005

CLINICAL DATA: Chest pain

CHEST - 2 VIEW:

[w chest pa]
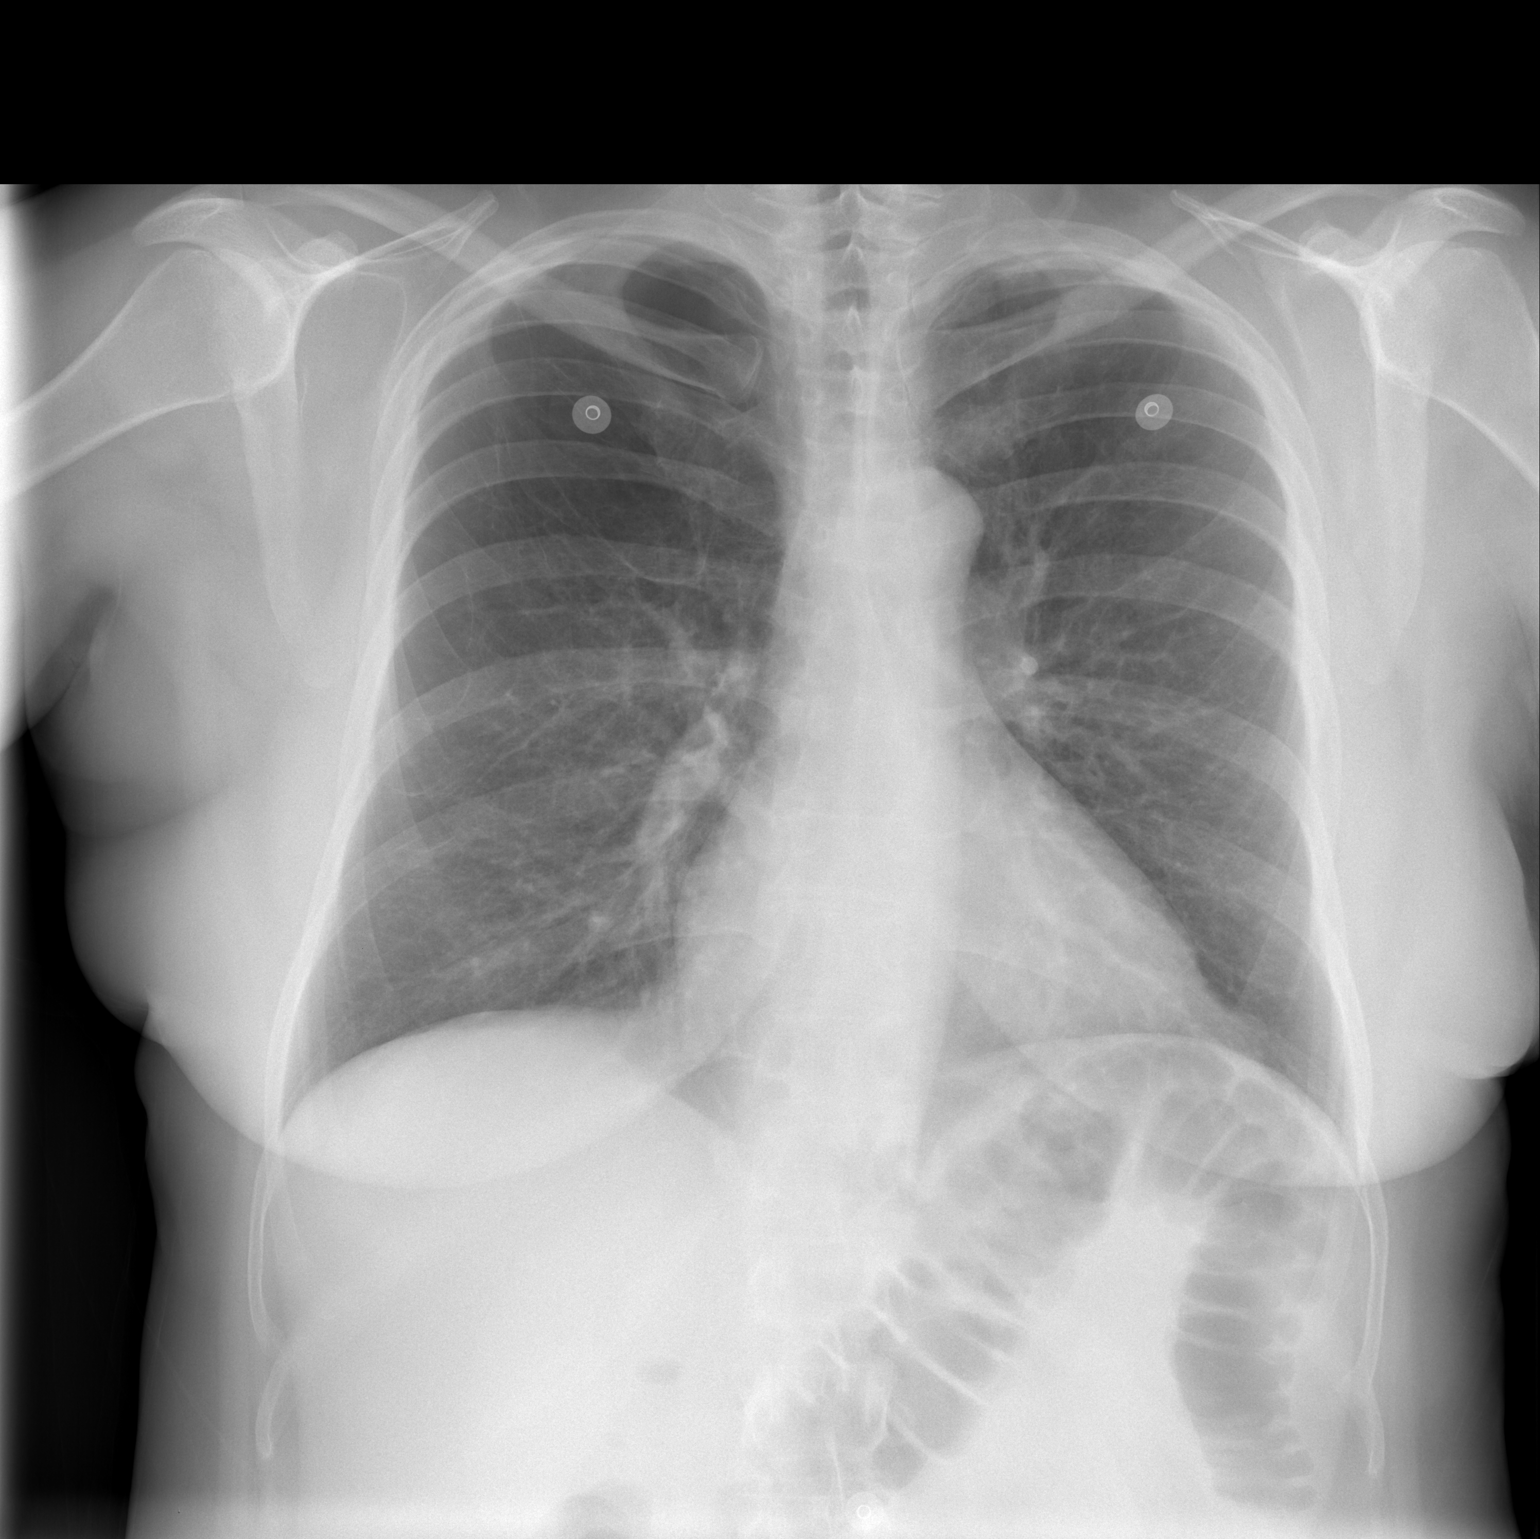

[w chest lat]
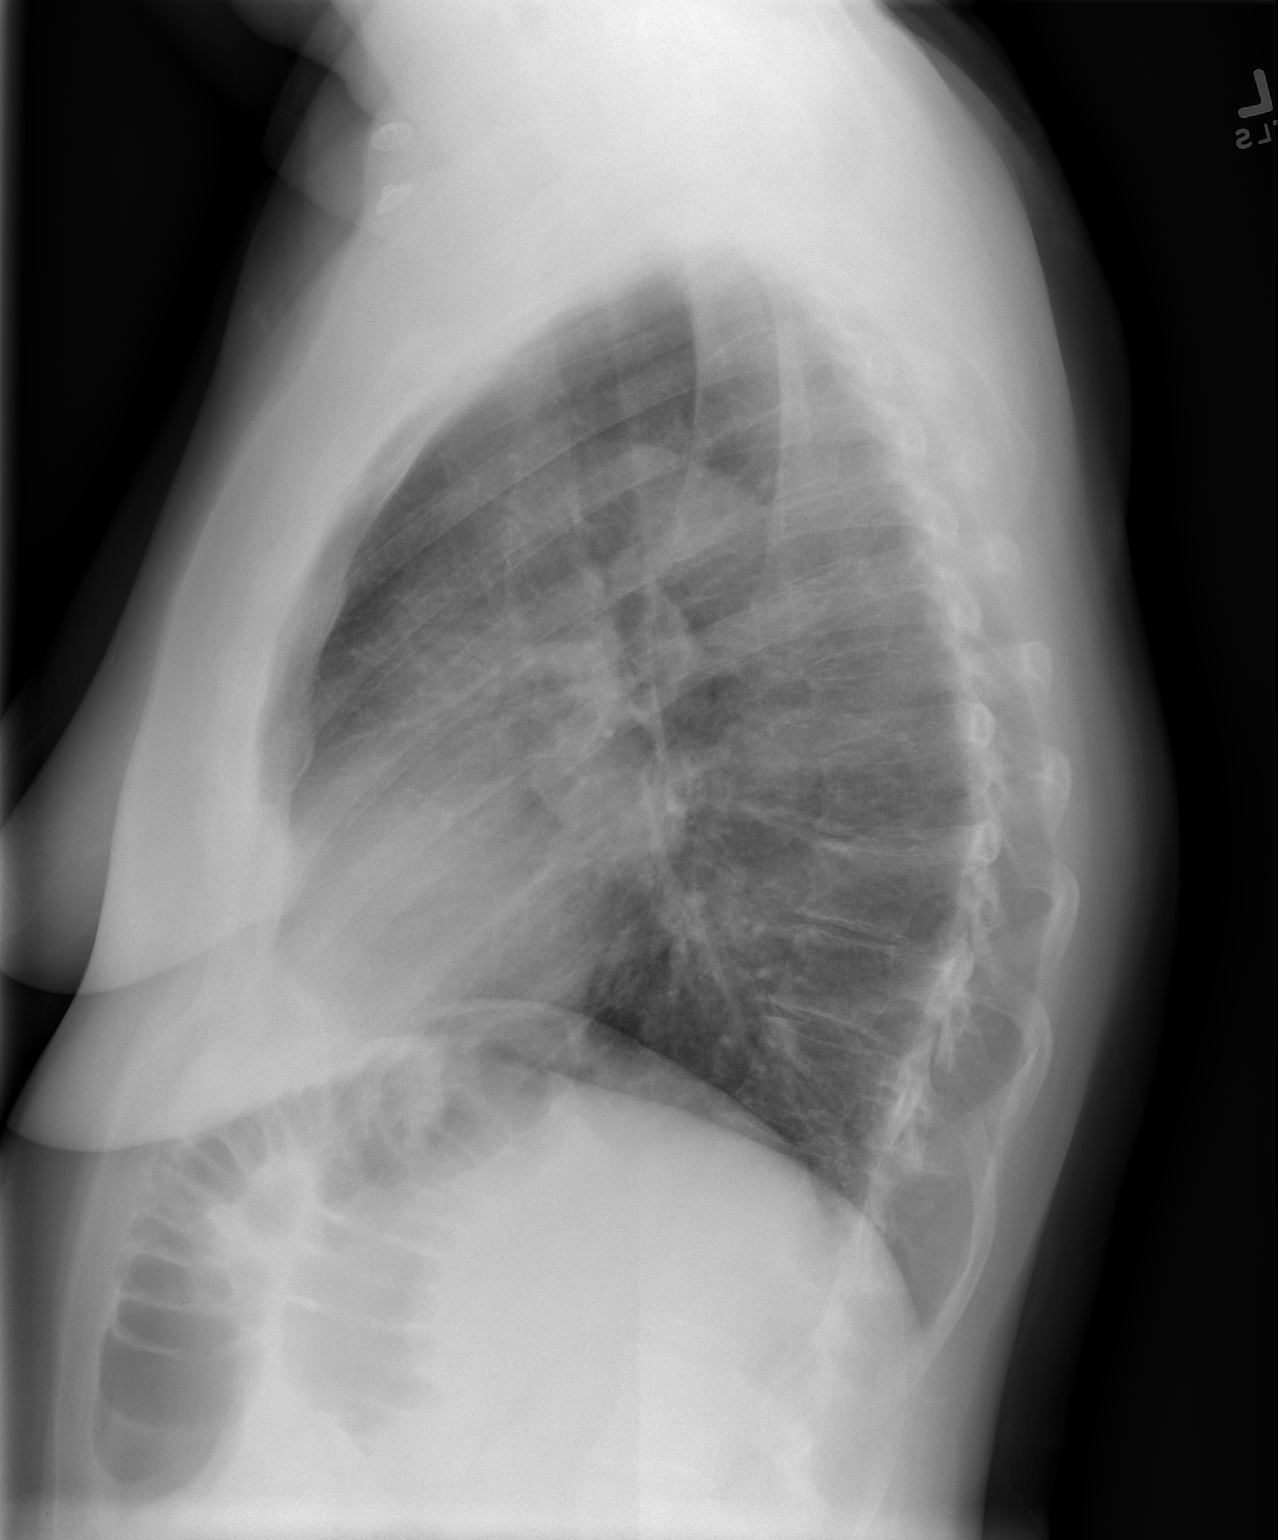

[2 of 2 positions shown; findings below may reference images not displayed]

FINDINGS: The heart size and mediastinal contours are within normal limits. 
Both lungs are clear. No effusions.  The visualized skeletal structures are
unremarkable.
IMPRESSION: No active cardiopulmonary disease

## 2009-08-07 IMAGING — CR DG CHEST 2V
2 series · 2 of 2 positions shown · non-contrast
Comparison: 04/04/07.

CLINICAL DATA: Cough.
 CHEST ? 2 VIEW:

[w chest pa]
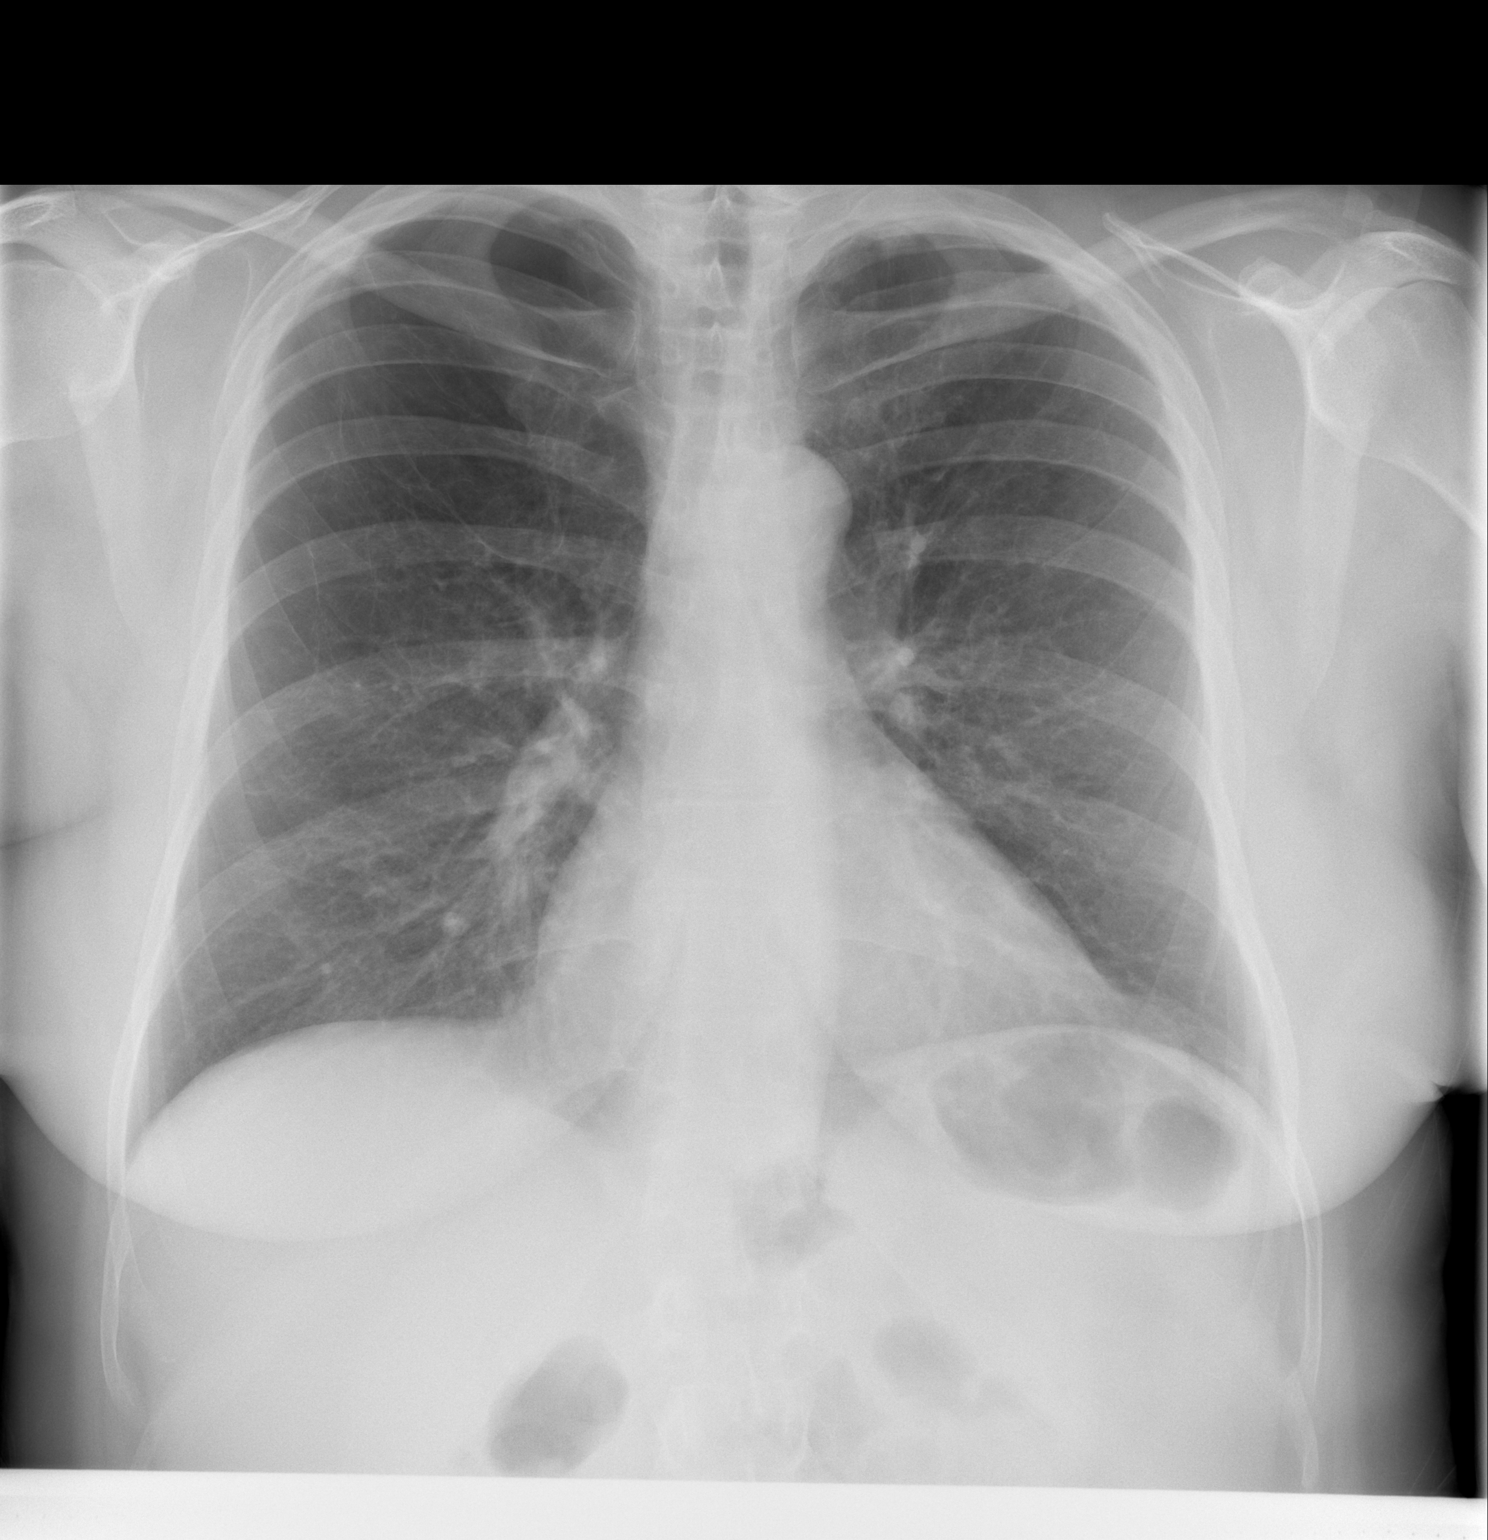

[w chest lat]
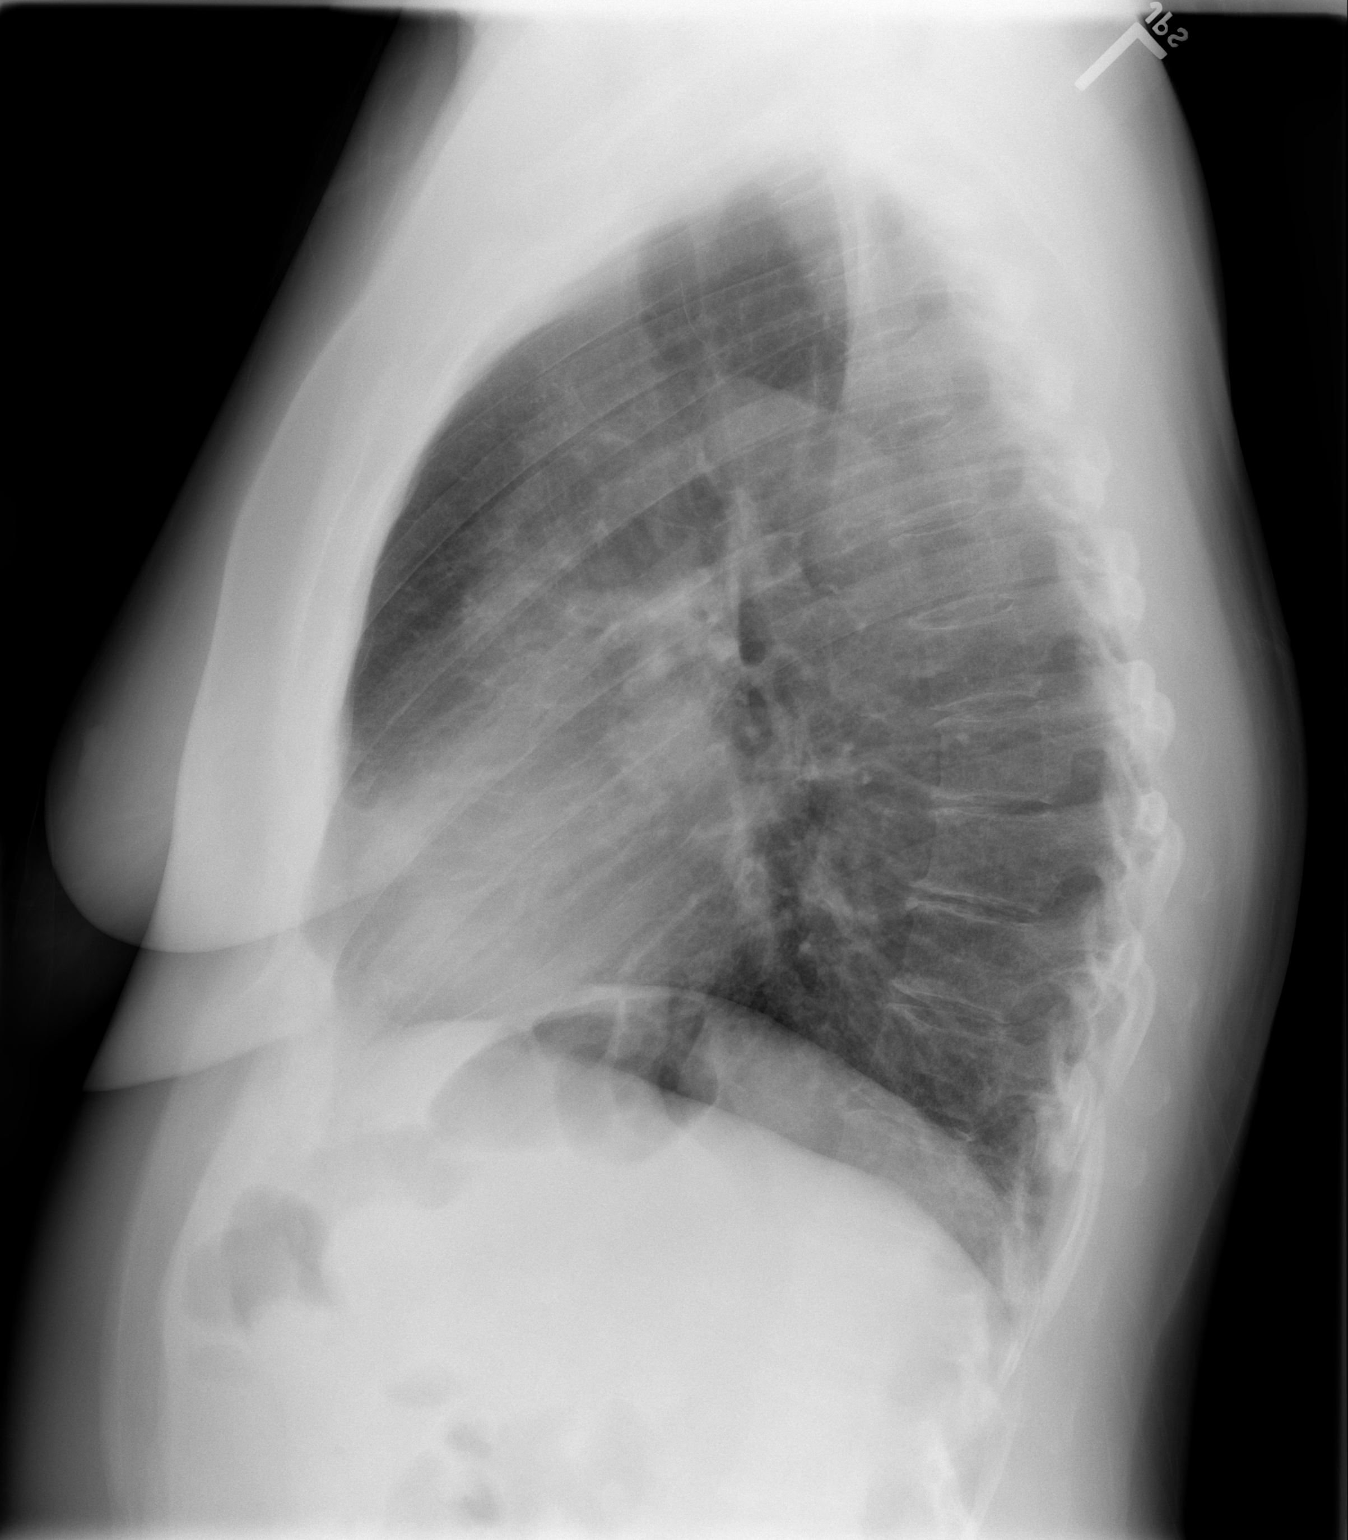

[2 of 2 positions shown; findings below may reference images not displayed]

FINDINGS: Lungs are emphysematous with bullous change, best demonstrated in the right upper lung zone.  No pleural effusion.  Heart size is normal.
IMPRESSION: No acute finding in patient with bullous emphysema.

## 2009-10-27 ENCOUNTER — Ambulatory Visit: Payer: Self-pay | Admitting: Infectious Disease

## 2009-10-27 ENCOUNTER — Encounter: Payer: Self-pay | Admitting: Internal Medicine

## 2009-10-27 ENCOUNTER — Encounter: Admission: RE | Admit: 2009-10-27 | Discharge: 2009-10-27 | Payer: Self-pay | Admitting: Internal Medicine

## 2009-10-27 ENCOUNTER — Ambulatory Visit (HOSPITAL_COMMUNITY): Admission: RE | Admit: 2009-10-27 | Discharge: 2009-10-27 | Payer: Self-pay | Admitting: Internal Medicine

## 2009-10-27 DIAGNOSIS — R634 Abnormal weight loss: Secondary | ICD-10-CM | POA: Insufficient documentation

## 2009-10-27 DIAGNOSIS — F341 Dysthymic disorder: Secondary | ICD-10-CM | POA: Insufficient documentation

## 2009-10-27 DIAGNOSIS — N6009 Solitary cyst of unspecified breast: Secondary | ICD-10-CM | POA: Insufficient documentation

## 2009-10-27 LAB — CONVERTED CEMR LAB
ALT: 11 units/L (ref 0–35)
AST: 14 units/L (ref 0–37)
Albumin: 4.5 g/dL (ref 3.5–5.2)
Alkaline Phosphatase: 69 units/L (ref 39–117)
BUN: 15 mg/dL (ref 6–23)
Basophils Absolute: 0 10*3/uL (ref 0.0–0.1)
Basophils Relative: 0 % (ref 0–1)
CO2: 25 meq/L (ref 19–32)
Calcium: 9.6 mg/dL (ref 8.4–10.5)
Chloride: 106 meq/L (ref 96–112)
Creatinine, Ser: 0.65 mg/dL (ref 0.40–1.20)
Eosinophils Absolute: 0.1 10*3/uL (ref 0.0–0.7)
Eosinophils Relative: 1 % (ref 0–5)
Glucose, Bld: 103 mg/dL — ABNORMAL HIGH (ref 70–99)
HCT: 44.3 % (ref 36.0–46.0)
Hemoglobin: 14.5 g/dL (ref 12.0–15.0)
Lymphocytes Relative: 23 % (ref 12–46)
Lymphs Abs: 1.6 10*3/uL (ref 0.7–4.0)
MCHC: 32.7 g/dL (ref 30.0–36.0)
MCV: 90.4 fL (ref >=78.0)
Monocytes Absolute: 0.3 10*3/uL (ref 0.1–1.0)
Monocytes Relative: 5 % (ref 3–12)
Neutro Abs: 5.2 10*3/uL (ref 1.7–7.7)
Neutrophils Relative %: 72 % (ref 43–77)
Platelets: 231 10*3/uL (ref 150–400)
Potassium: 4.2 meq/L (ref 3.5–5.3)
RBC: 4.9 M/uL (ref 3.87–5.11)
RDW: 14.5 % (ref 11.5–15.5)
Sed Rate: 12 mm/hr (ref 0–22)
Sodium: 140 meq/L (ref 135–145)
TSH: 1.529 microintl units/mL (ref 0.350–4.5)
Total Bilirubin: 0.4 mg/dL (ref 0.3–1.2)
Total Protein: 6.8 g/dL (ref 6.0–8.3)
WBC: 7.2 10*3/uL (ref 4.0–10.5)

## 2009-11-06 ENCOUNTER — Encounter: Payer: Self-pay | Admitting: Internal Medicine

## 2009-11-06 ENCOUNTER — Encounter: Admission: RE | Admit: 2009-11-06 | Discharge: 2009-11-06 | Payer: Self-pay | Admitting: Internal Medicine

## 2009-11-06 ENCOUNTER — Encounter (INDEPENDENT_AMBULATORY_CARE_PROVIDER_SITE_OTHER): Payer: Self-pay | Admitting: Internal Medicine

## 2009-11-28 ENCOUNTER — Ambulatory Visit: Payer: Self-pay | Admitting: Internal Medicine

## 2009-11-28 ENCOUNTER — Telehealth: Payer: Self-pay | Admitting: Internal Medicine

## 2009-11-28 DIAGNOSIS — R002 Palpitations: Secondary | ICD-10-CM | POA: Insufficient documentation

## 2009-11-28 LAB — CONVERTED CEMR LAB
Bilirubin Urine: NEGATIVE
Blood in Urine, dipstick: NEGATIVE
Glucose, Urine, Semiquant: NEGATIVE
Ketones, urine, test strip: NEGATIVE
Nitrite: NEGATIVE
Protein, U semiquant: NEGATIVE
Specific Gravity, Urine: 1.015
Urobilinogen, UA: 0.2
WBC Urine, dipstick: NEGATIVE
pH: 5

## 2009-12-01 ENCOUNTER — Telehealth: Payer: Self-pay | Admitting: Internal Medicine

## 2009-12-02 IMAGING — CR DG CHEST 2V
2 series · 2 of 2 positions shown · non-contrast
Comparison: none

CLINICAL DATA: Shortness of breath

Chest 2 view:
Comparison 04/10/2007. Multiple right upper lobe bullae as before. Lungs
otherwise clear. Heart size normal. No effusion. Visualized bones unremarkable.

[w chest pa]
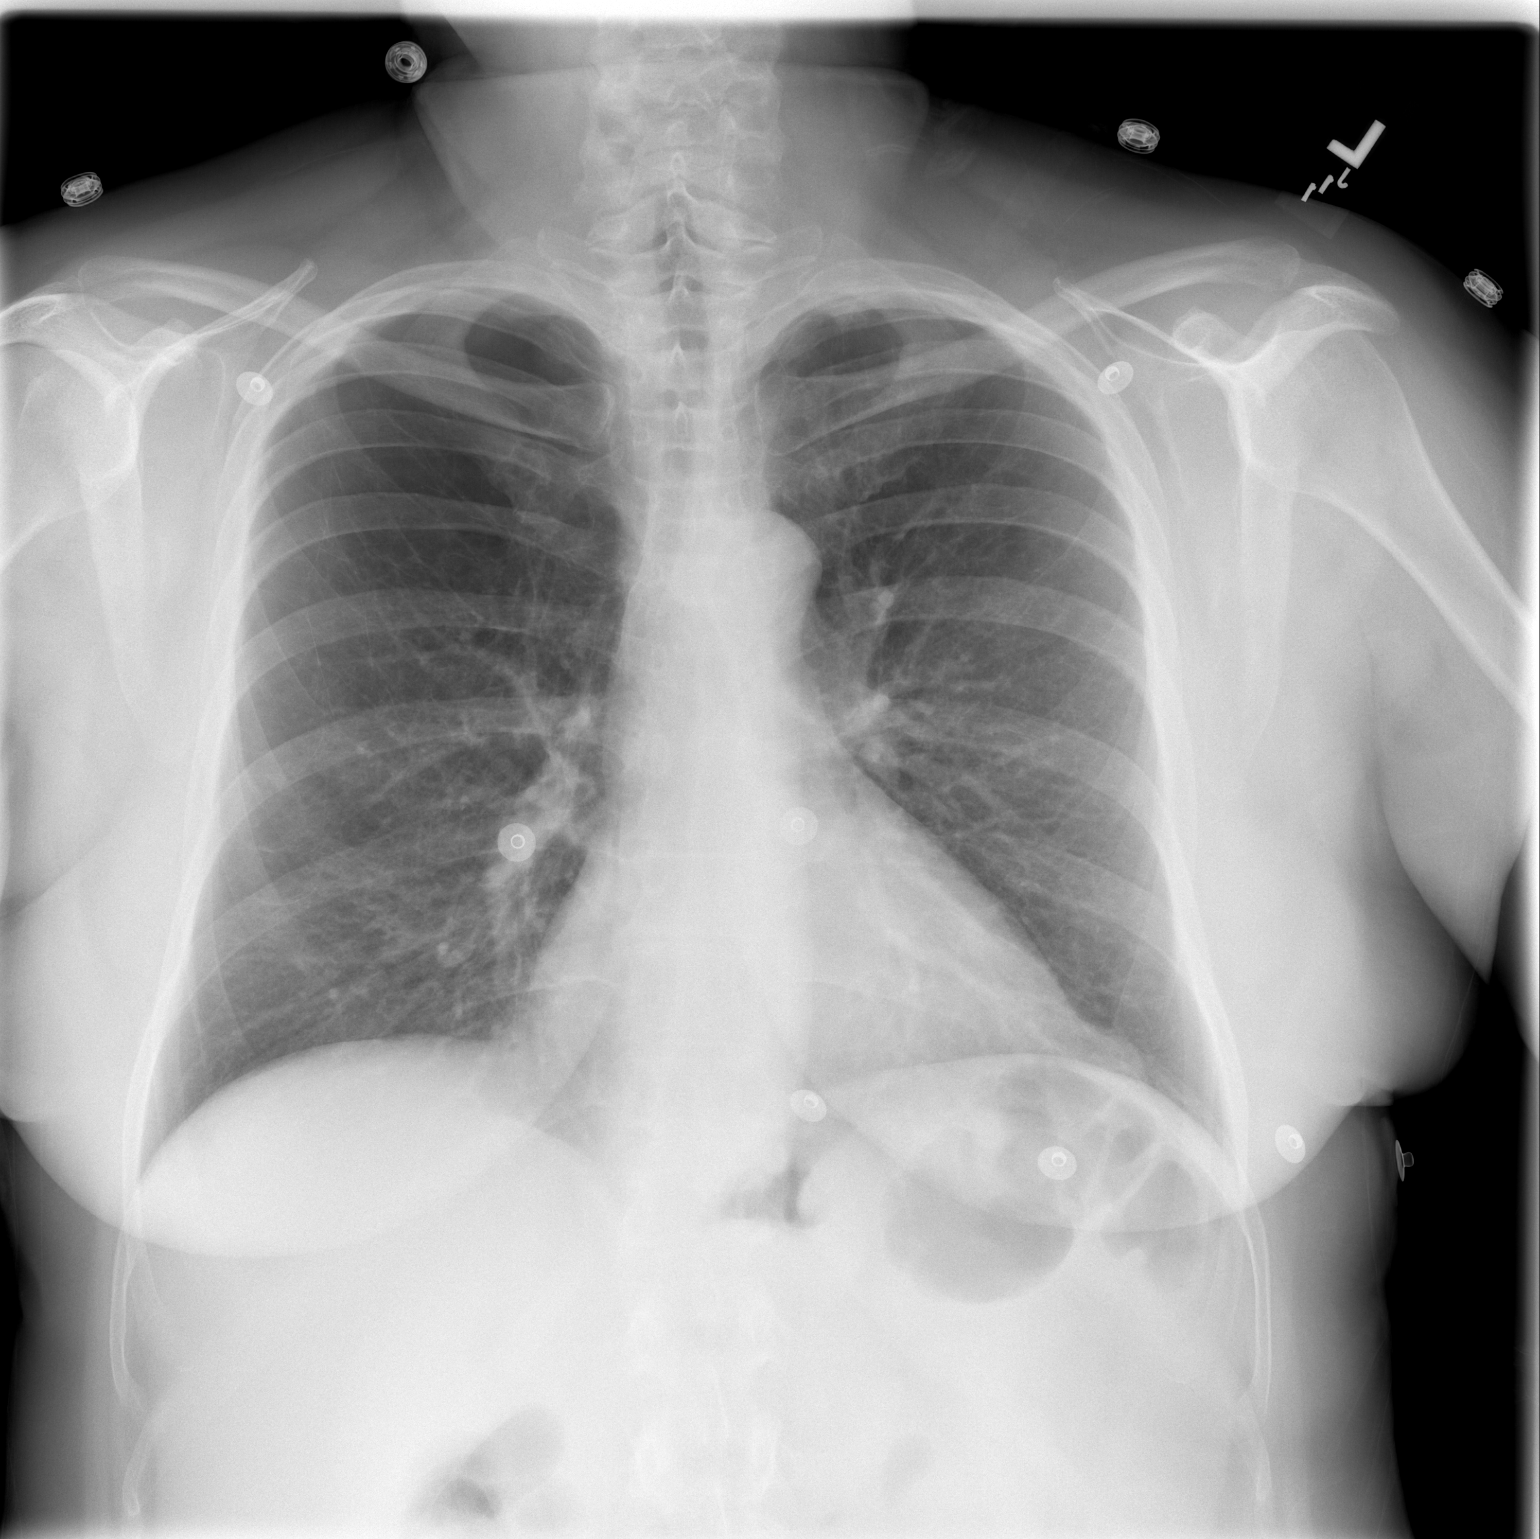

[w chest lat]
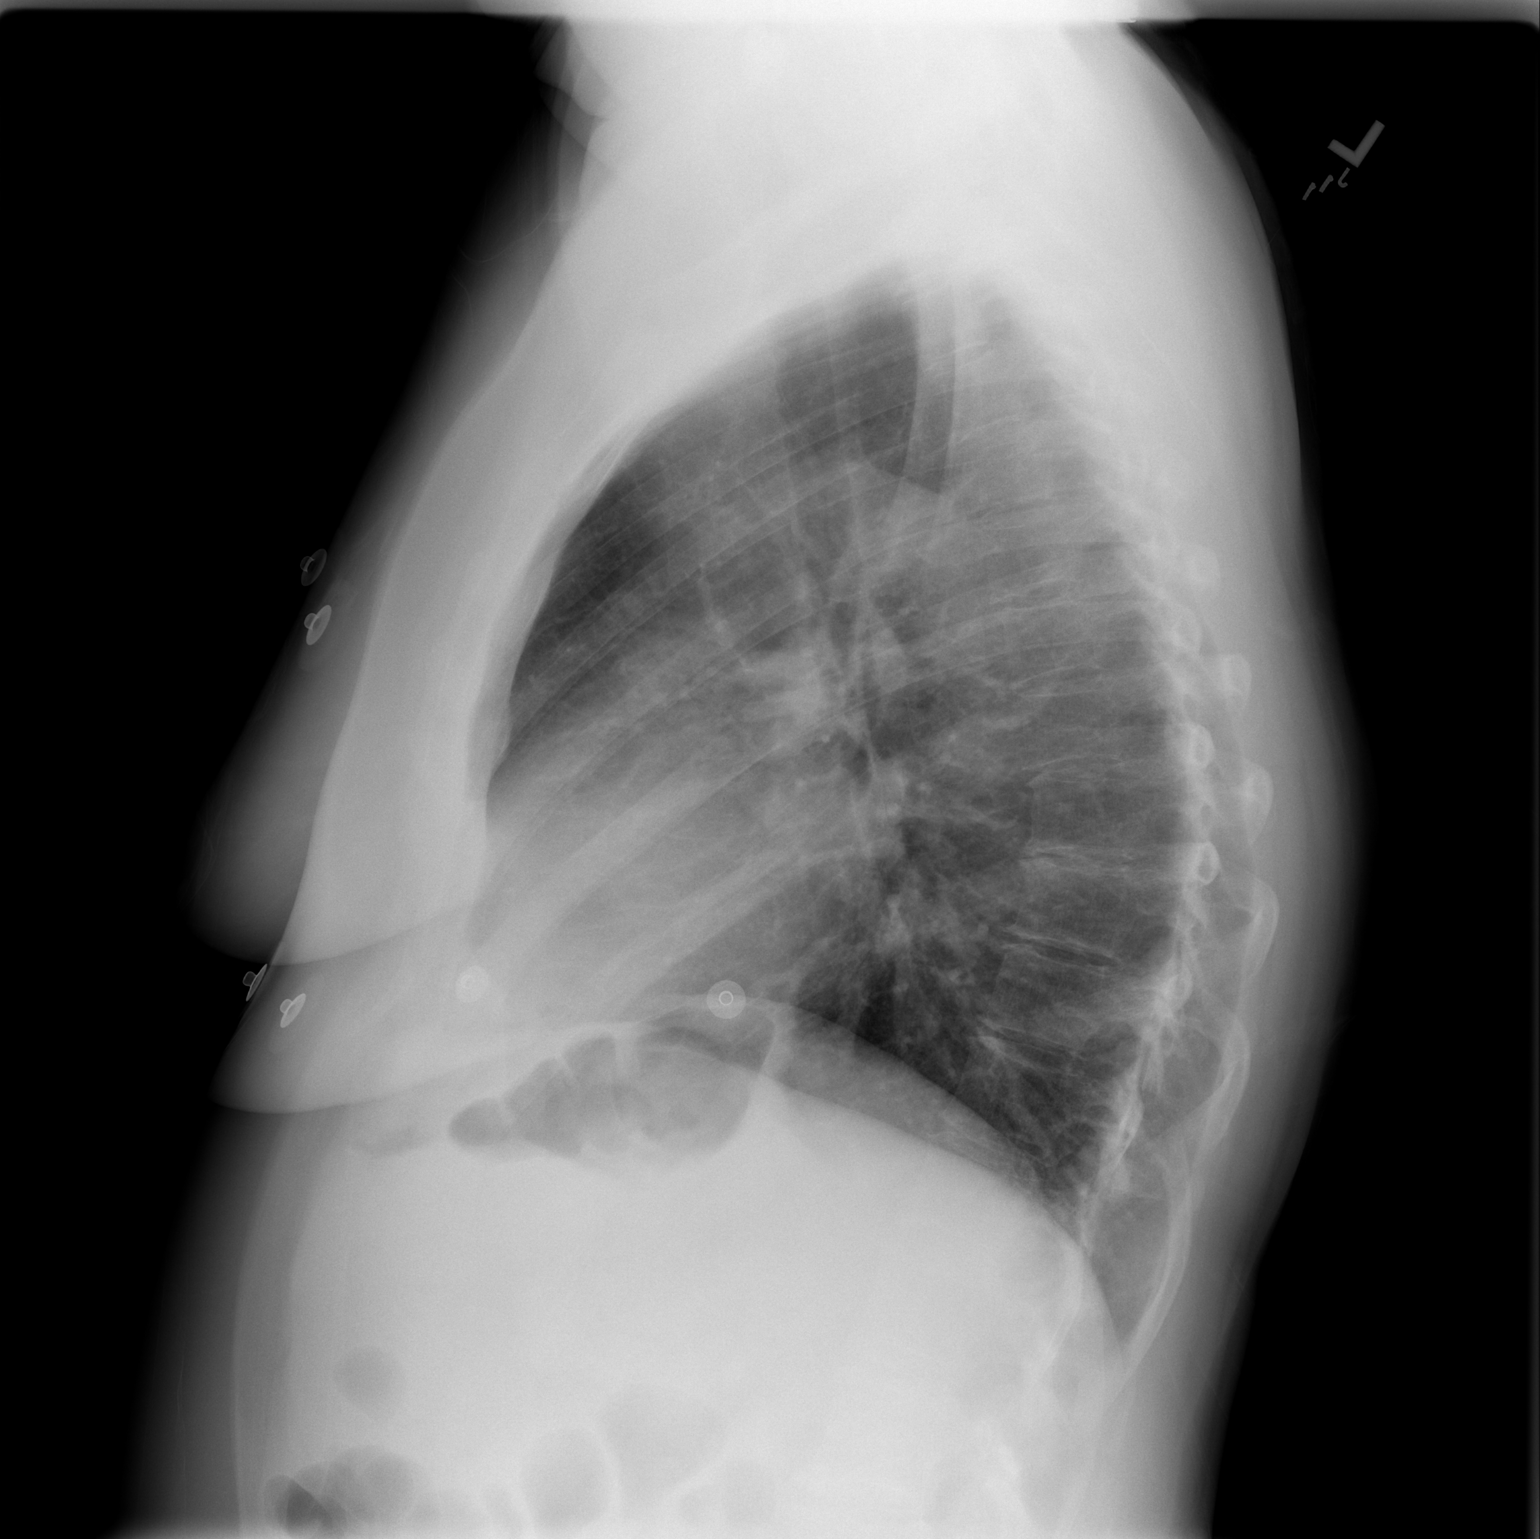

[2 of 2 positions shown; findings below may reference images not displayed]

IMPRESSION: 1. Bullous changes in the right upper lobe with no acute or superimposed
abnormality.

## 2009-12-08 IMAGING — US US TRANSVAGINAL NON-OB
1 series · 14 of 25 positions shown · non-contrast
Comparison: none

CLINICAL DATA: Right-sided pain.
 TRANSABDOMINAL AND TRANSVAGINAL PELVIC ULTRASOUND:
TECHNIQUE: Both transabdominal and transvaginal ultrasound examinations of the pelvis were performed including evaluation of the uterus, ovaries, adnexal regions, and pelvic cul-de-sac.

[Series 1: us transvaginal non-ob · 0.28mm/px · 14 of 54 slices shown]
[im 1/54]
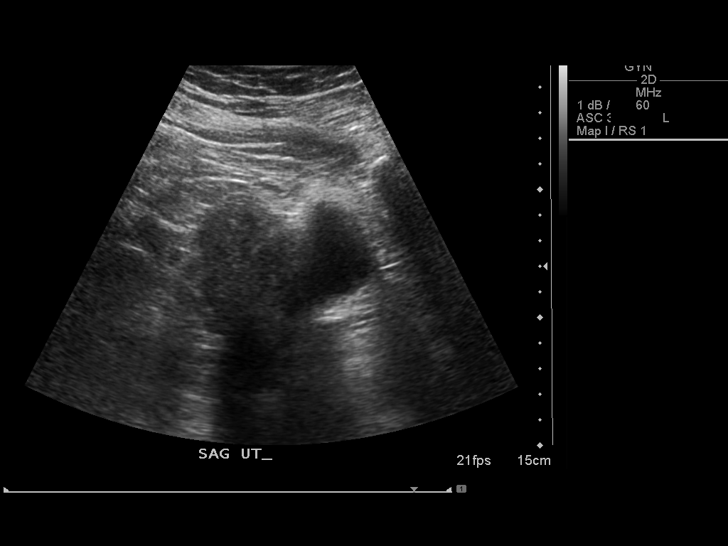
[im 5/54]
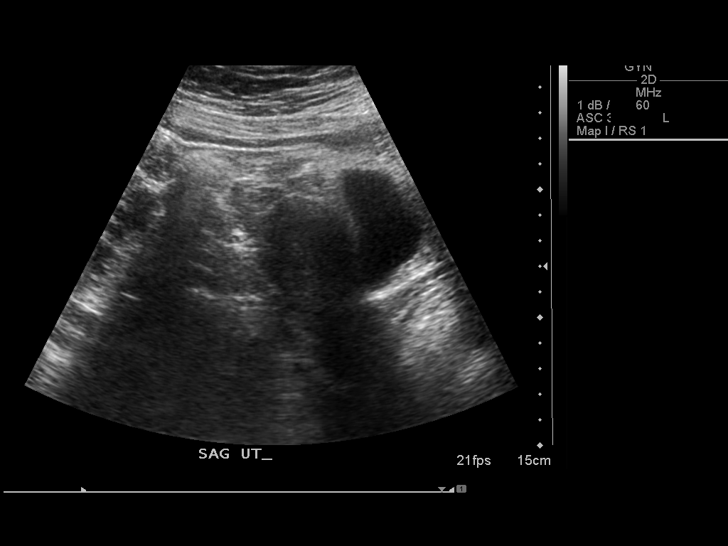
[im 9/54]
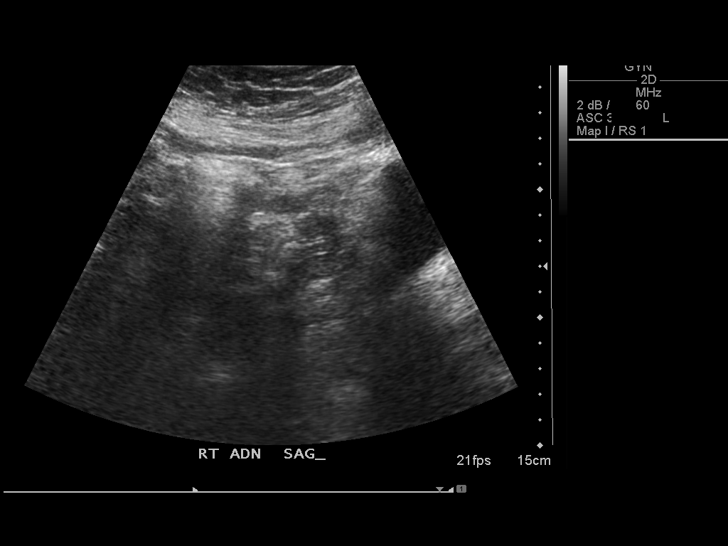
[im 14/54]
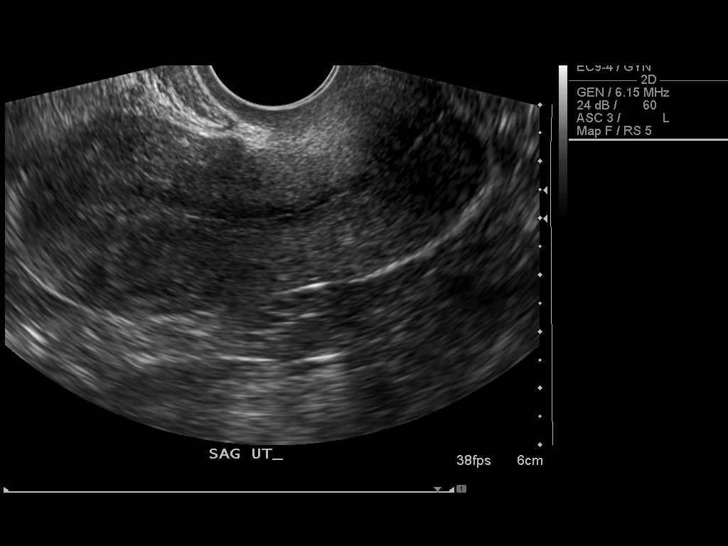
[im 18/54]
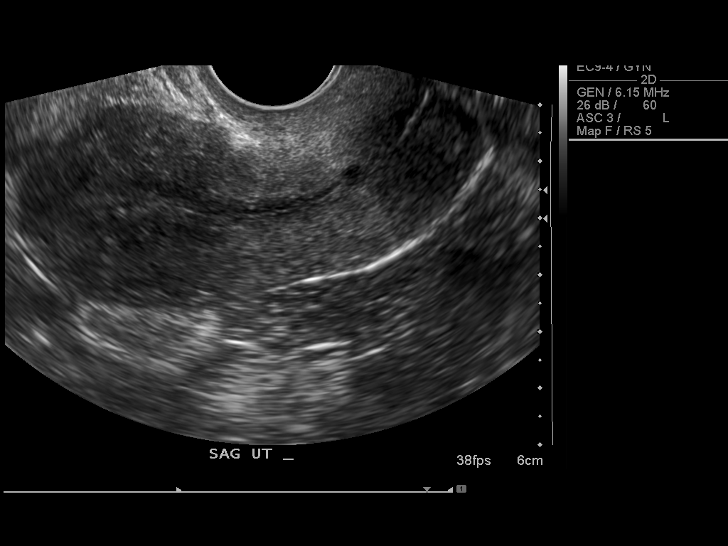
[im 20/54]
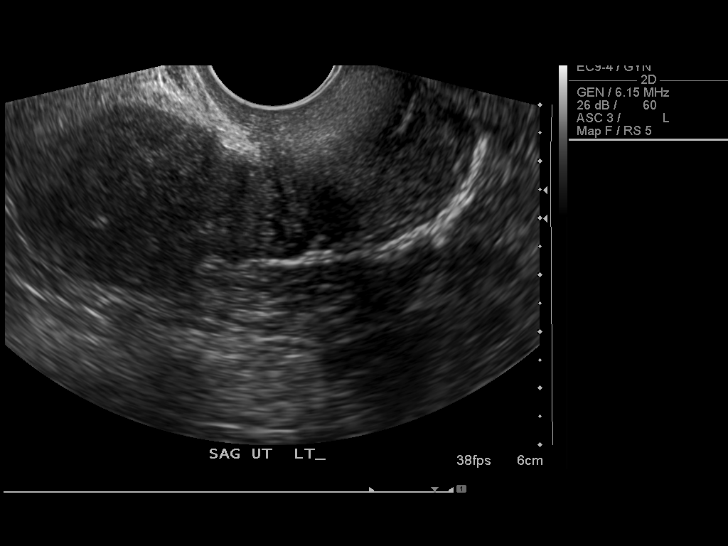
[im 25/54]
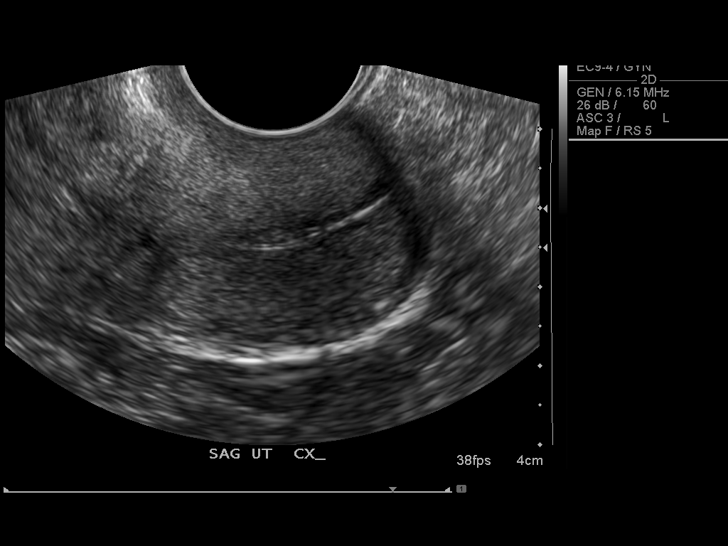
[im 29/54]
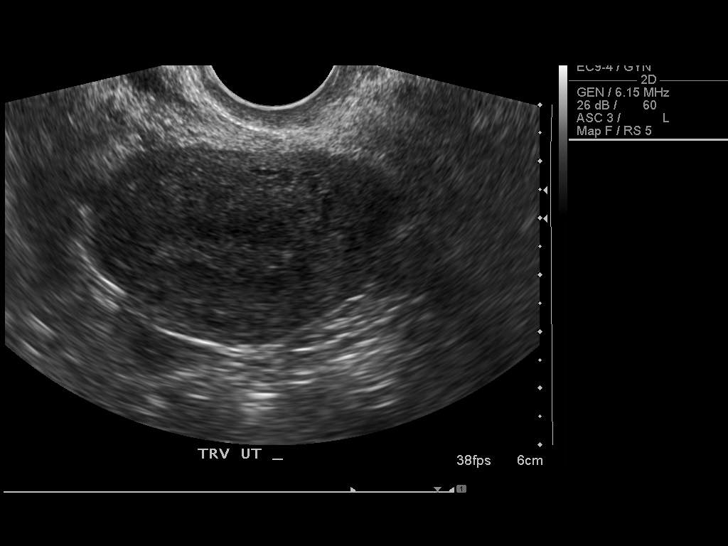
[im 34/54]
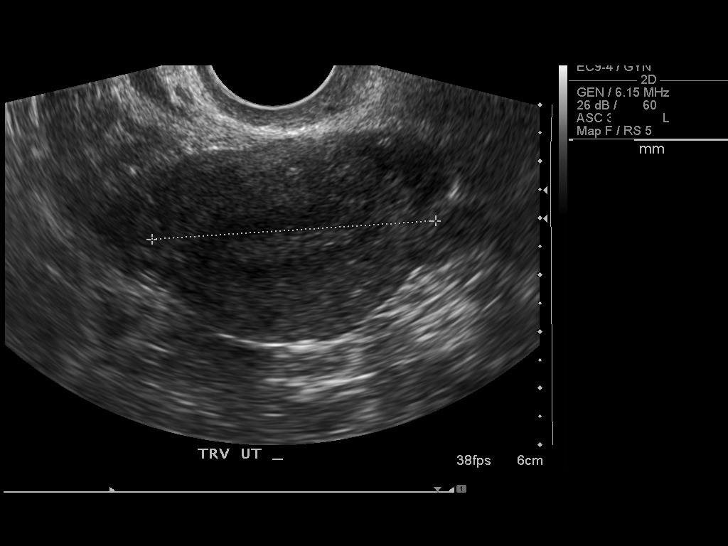
[im 36/54]
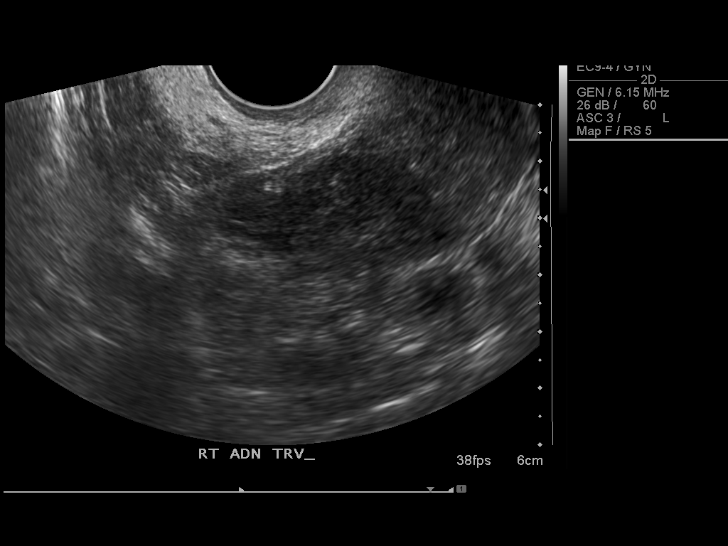
[im 40/54]
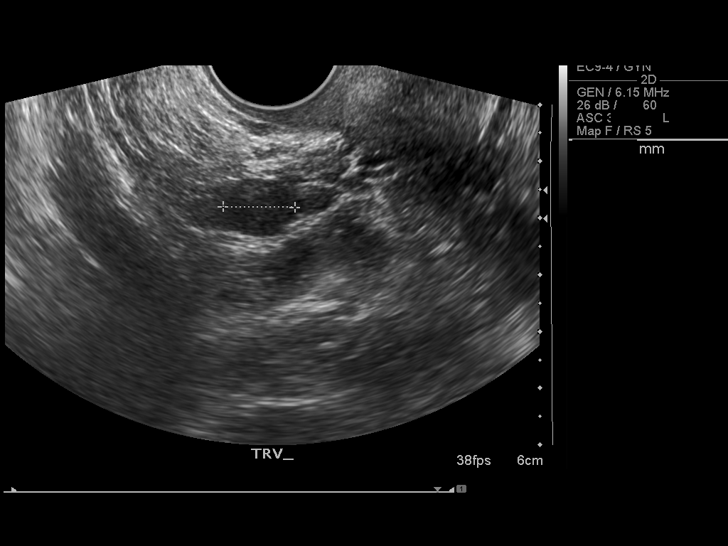
[im 45/54]
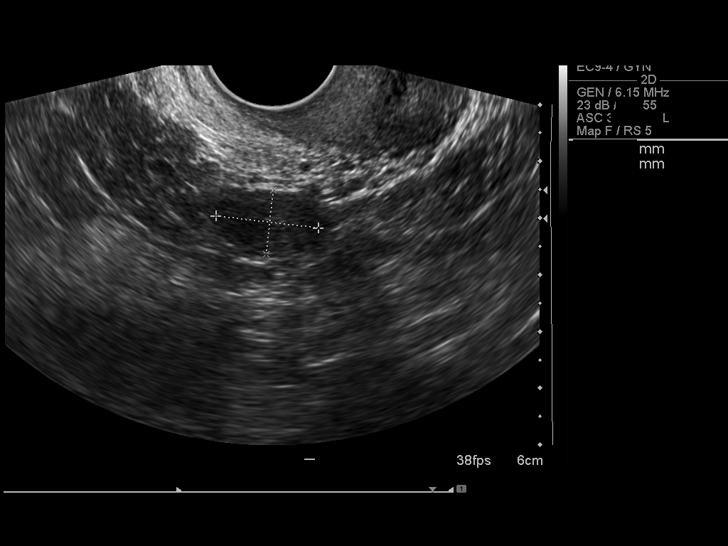
[im 49/54]
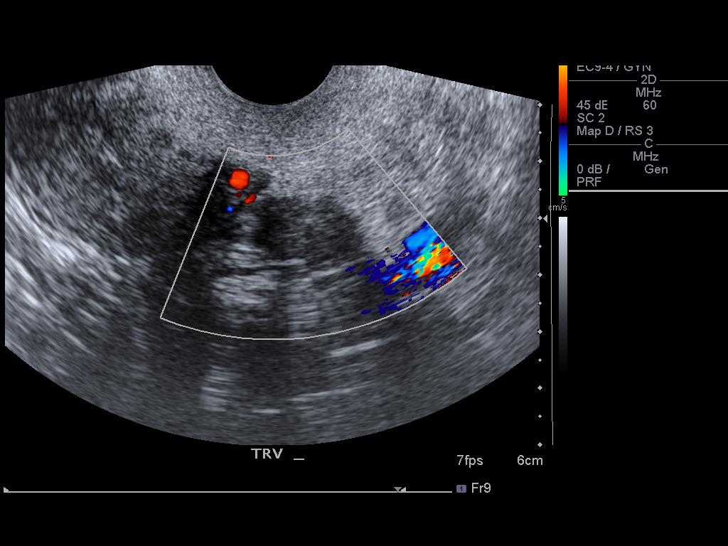
[im 54/54]
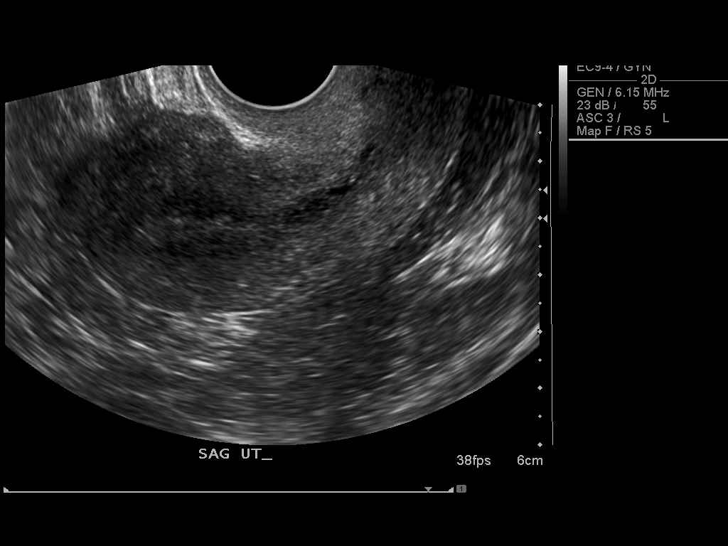

[14 of 25 positions shown; findings below may reference images not displayed]

FINDINGS: The uterus has a normal size and echotexture.  The uterine dimensions are 8.2 x 3.7 x 5.0 cm.  Both ovaries have a normal size and appearance as well.  The right ovary measures 2.0 x 1.0 x 1.2 cm, and the left ovary measures 1.8 x 1.1 x 1.5 cm.   No adnexal masses or free pelvic fluid are identified.
IMPRESSION: Normal pelvic ultrasound.

## 2009-12-19 ENCOUNTER — Encounter: Payer: Self-pay | Admitting: Internal Medicine

## 2009-12-20 ENCOUNTER — Telehealth: Payer: Self-pay | Admitting: Internal Medicine

## 2009-12-22 ENCOUNTER — Encounter: Payer: Self-pay | Admitting: Internal Medicine

## 2009-12-30 IMAGING — CT CT HEAD W/O CM
1 series · 16 of 30 positions shown, 20 images · non-contrast
Comparison: None

CLINICAL DATA: Dizziness

CT HEAD WITHOUT CONTRAST
TECHNIQUE: Contiguous axial images were obtained from the base of
the skull through the vertex without contrast.

[Series 2: brain · axial · 0.47mm/px · z∈[+112,+258]mm · 16 of 32 slices shown, 20 images]
[im 2/32  brain]
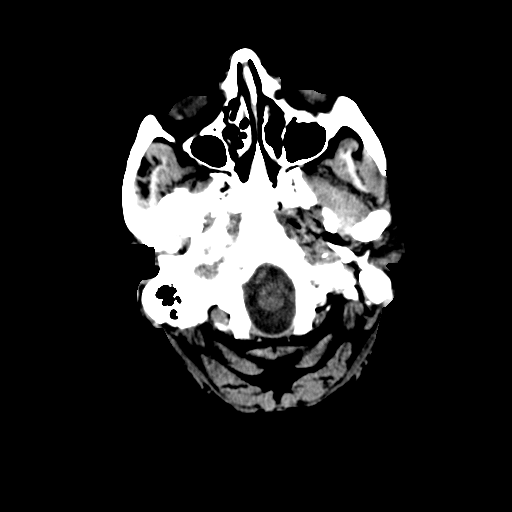
[im 2/32  bone]
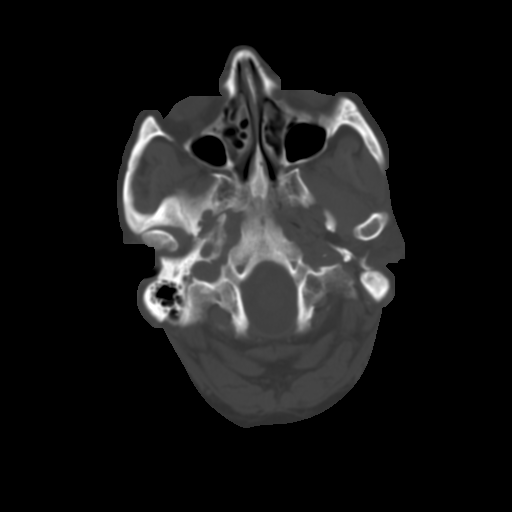
[im 4/32  brain]
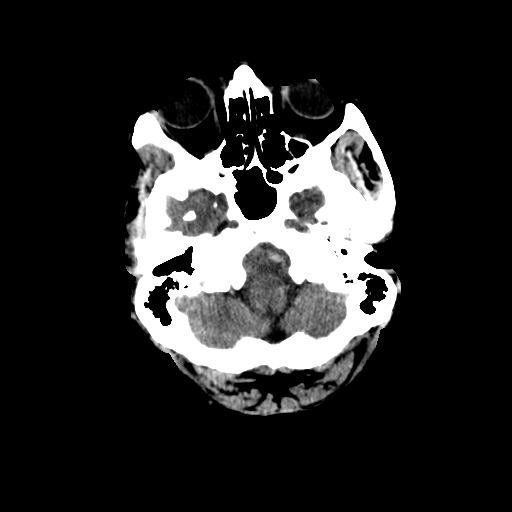
[im 6/32  brain]
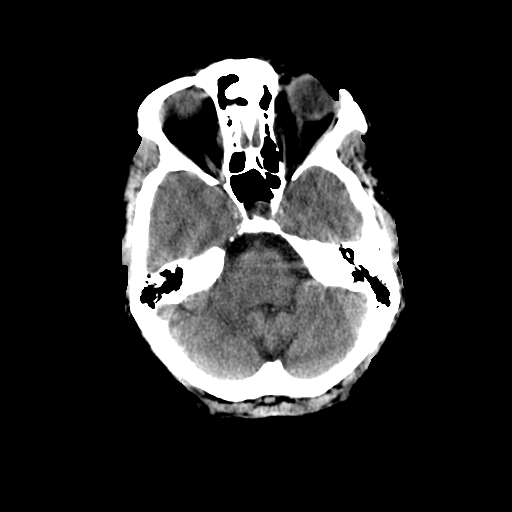
[im 8/32  brain]
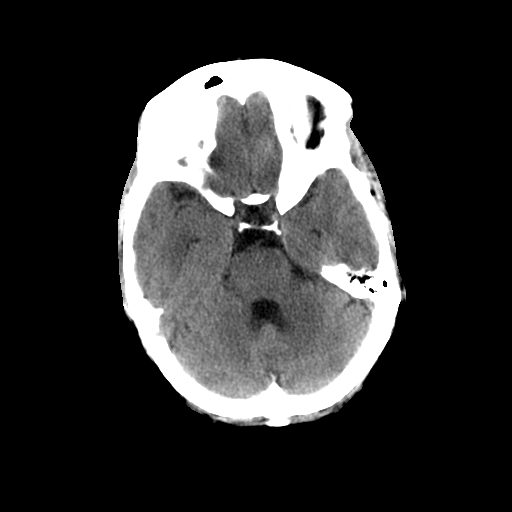
[im 9/32  brain]
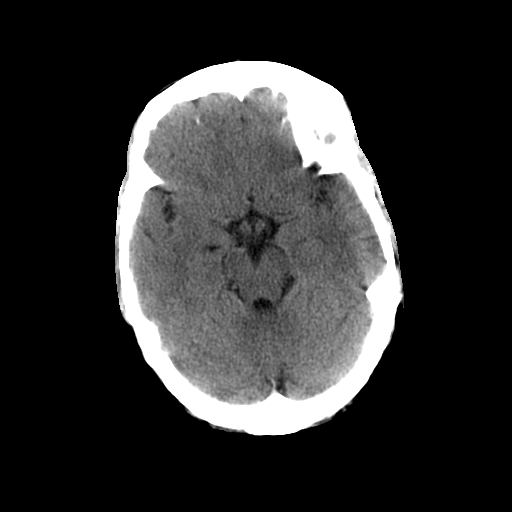
[im 9/32  bone]
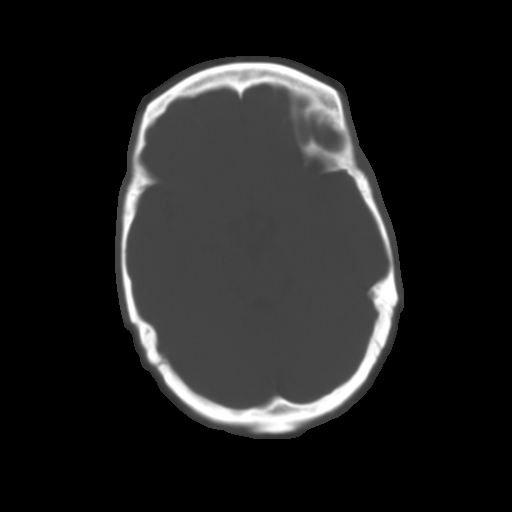
[im 11/32  brain]
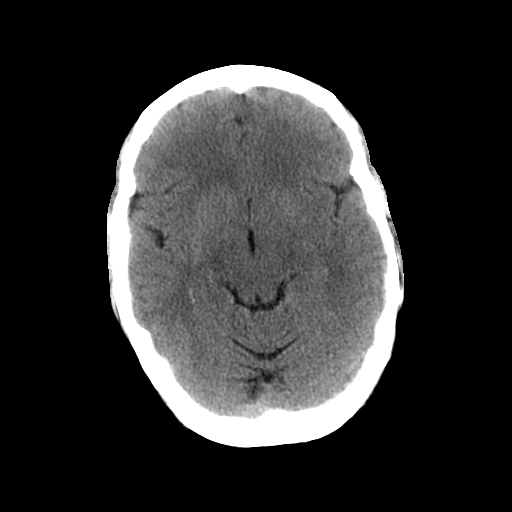
[im 13/32  brain]
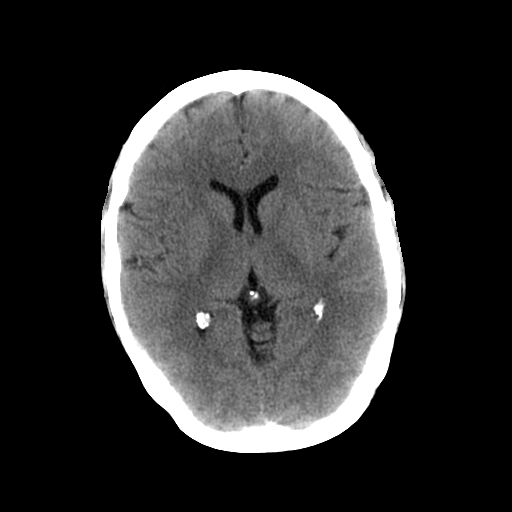
[im 15/32  brain]
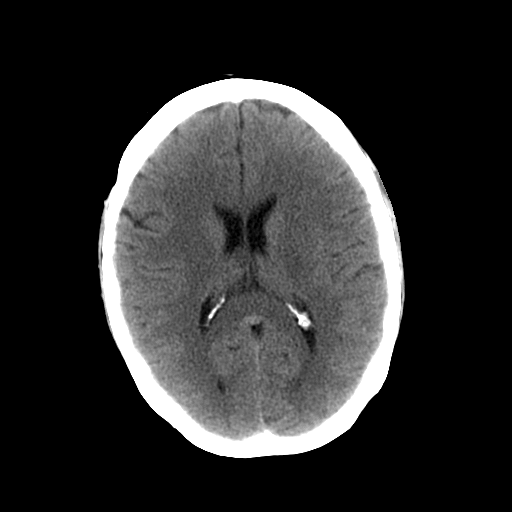
[im 17/32  brain]
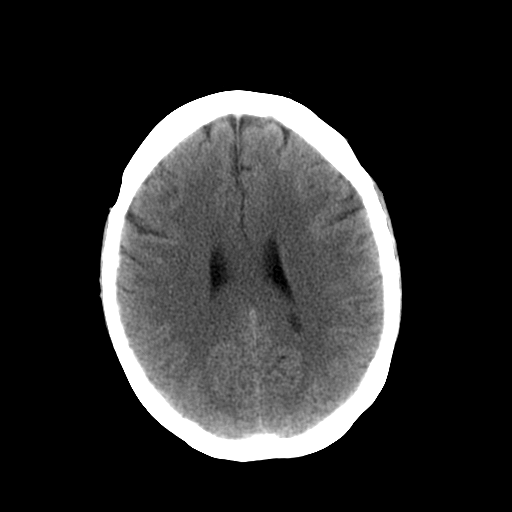
[im 17/32  bone]
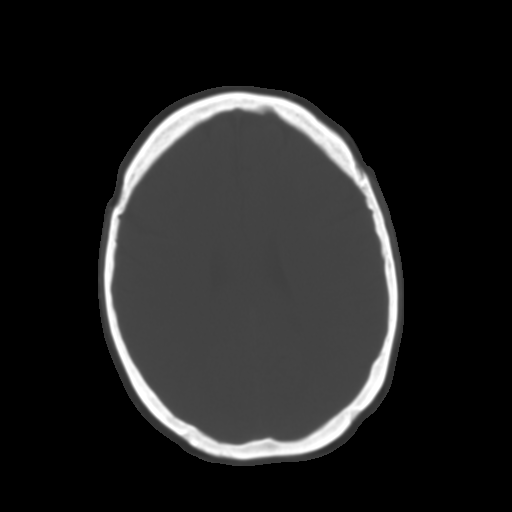
[im 19/32  brain]
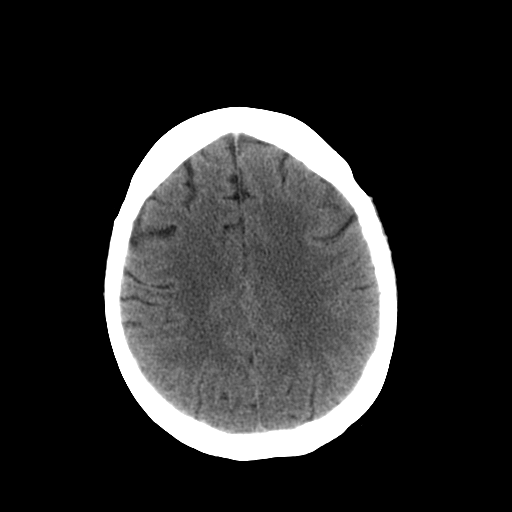
[im 21/32  brain]
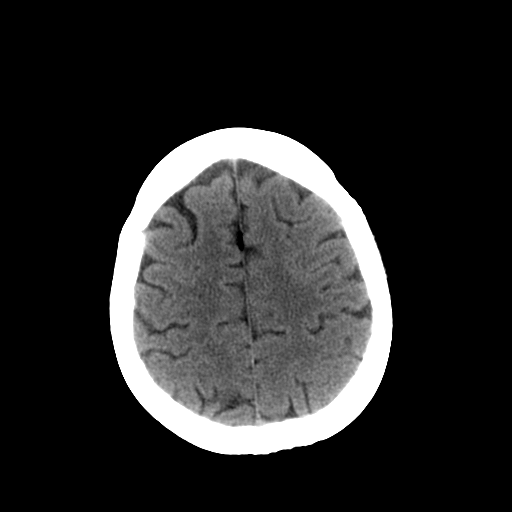
[im 23/32  brain]
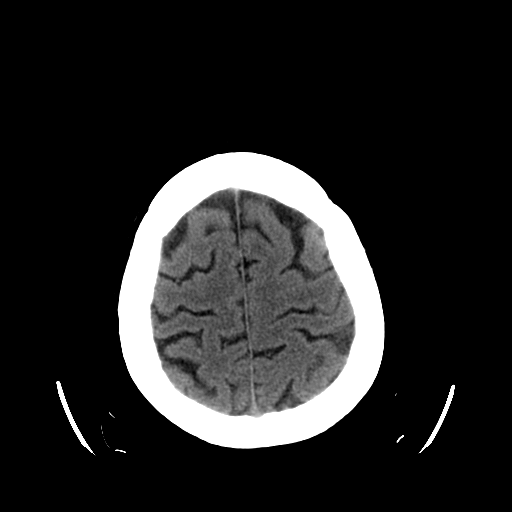
[im 24/32  brain]
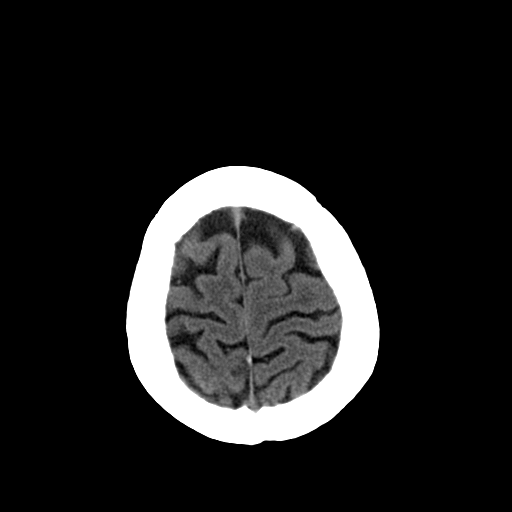
[im 24/32  bone]
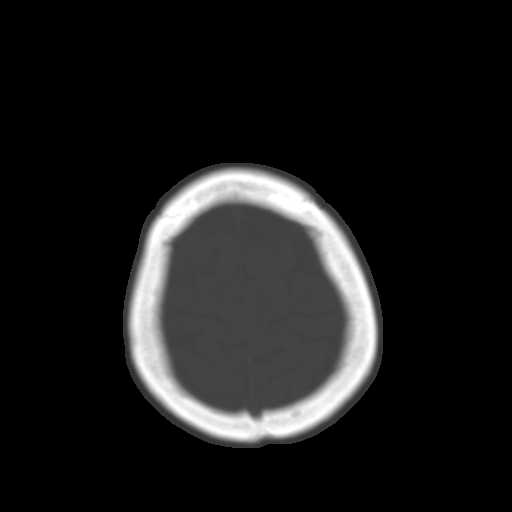
[im 26/32  brain]
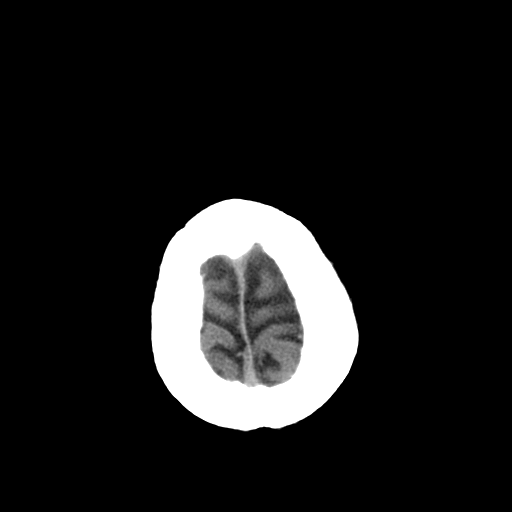
[im 28/32  brain]
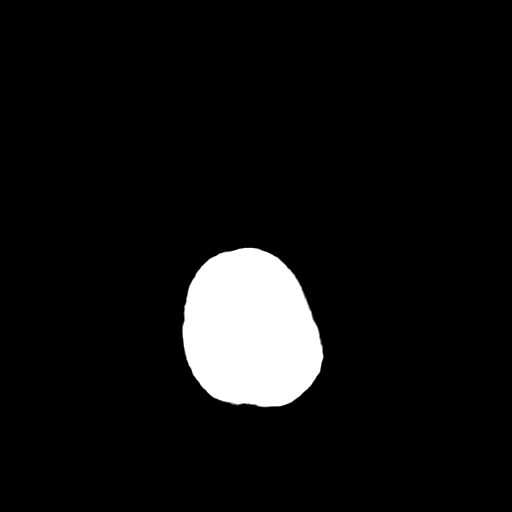
[im 30/32  brain]
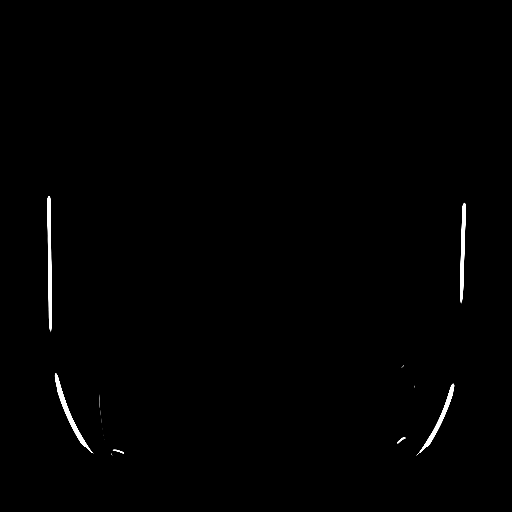

[16 of 30 positions shown; findings below may reference images not displayed]

FINDINGS: The ventricles are not enlarged.  There is no hemorrhage
or mass.  There is no acute infarct. There is no skull abnormality.
IMPRESSION: No acute abnormality.

## 2010-01-03 IMAGING — MR MR HEAD WO/W CM
14 of 18 series · 27 of 48 positions shown · IV contrast (multihance)
Comparison: CT head without contrast 09/02/2007.

CLINICAL DATA: 51-year-old female with generalized weakness,
headache vertigo and tinnitus.

MRI HEAD WITHOUT AND WITH CONTRAST
TECHNIQUE: Multiplanar, multiecho pulse sequences of the brain and
surrounding structures were obtained according to standard protocol
without and with intravenous contrast
Contrast: 17 ml MultiHance.

[Series 1: 3 plane loc · axial · 5.0mm · 0.94mm/px · 1 of 18 slices shown]
[im 1/18]
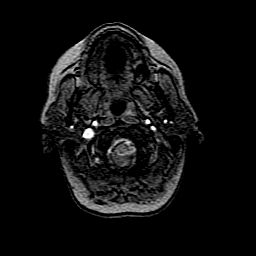

[Series 2: DWI · axial · 5.0mm · 1.25mm/px · z∈[-31,+112]mm · 4 of 54 slices shown (1 of 3)]
[im 1/54]
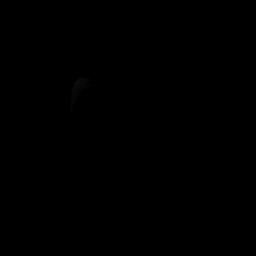
[im 18/54]
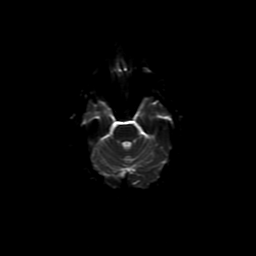
[im 36/54]
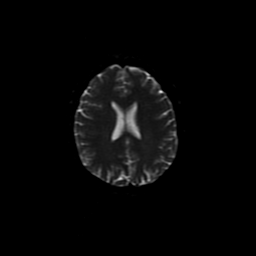
[im 54/54]
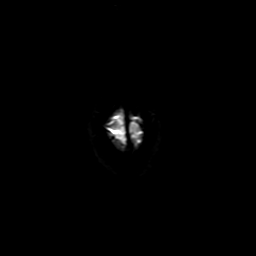

[Series 3: T2 · axial · 5.0mm · 0.43mm/px · z∈[-23,+110]mm · 2 of 20 slices shown]
[im 1/20]
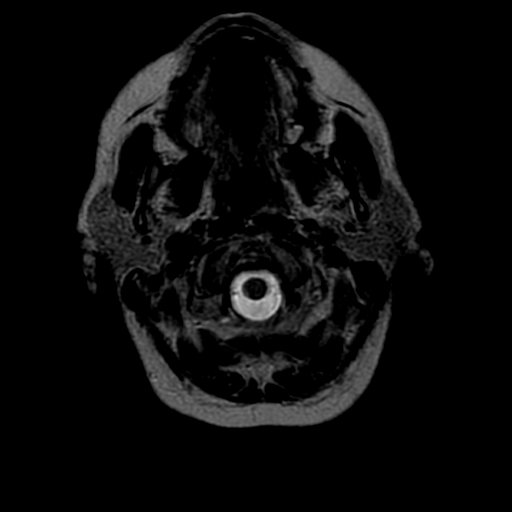
[im 20/20]
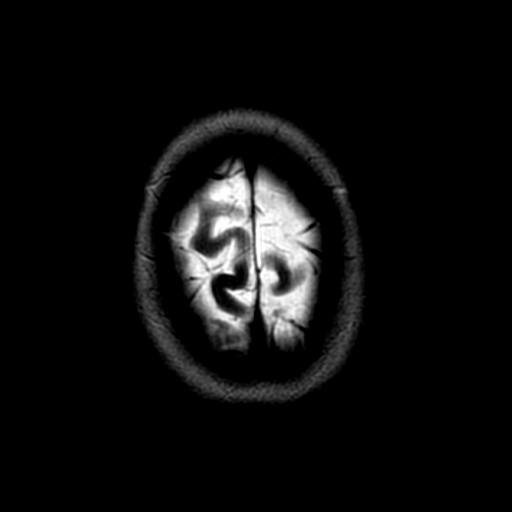

[Series 4: FLAIR · axial · 5.0mm · 0.43mm/px · z∈[-23,+110]mm · 2 of 20 slices shown]
[im 1/20]
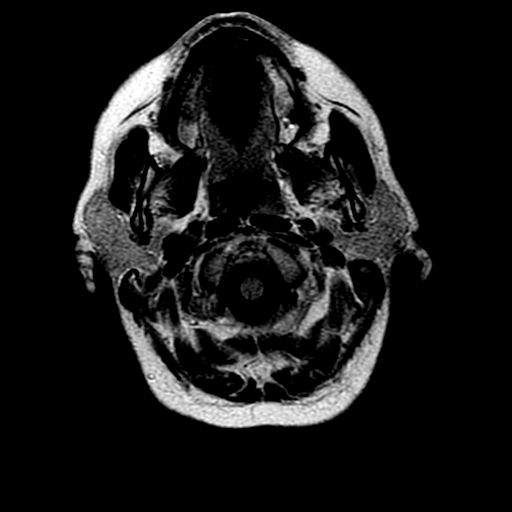
[im 20/20]
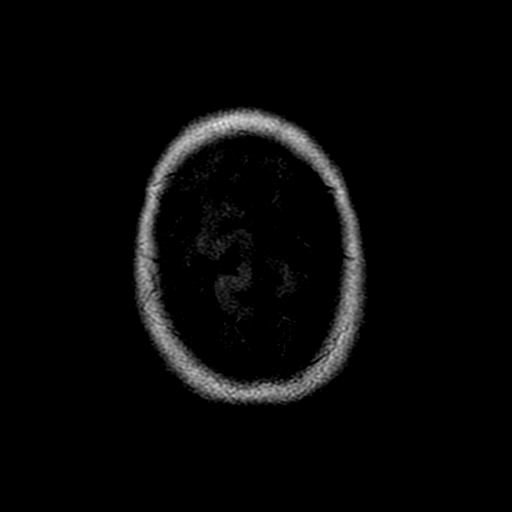

[Series 6: (id) · axial · 5.0mm · 0.43mm/px · z∈[-26,+112]mm · 2 of 24 slices shown]
[im 1/24]
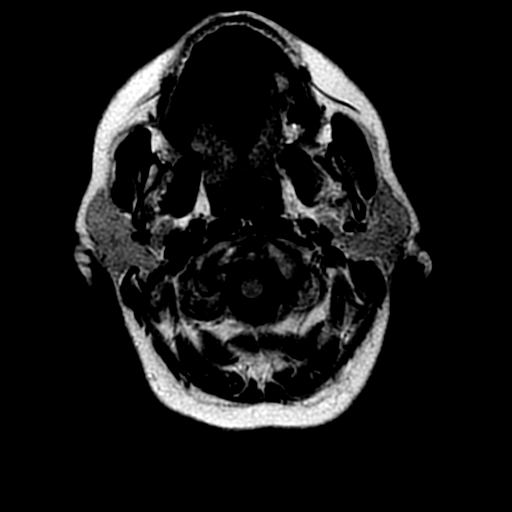
[im 24/24]
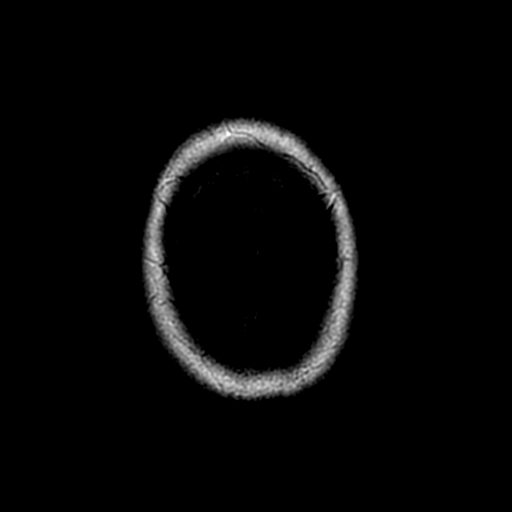

[Series 7: T2-star · axial · 5.0mm · 0.43mm/px · z∈[-23,+110]mm · 2 of 20 slices shown]
[im 1/20]
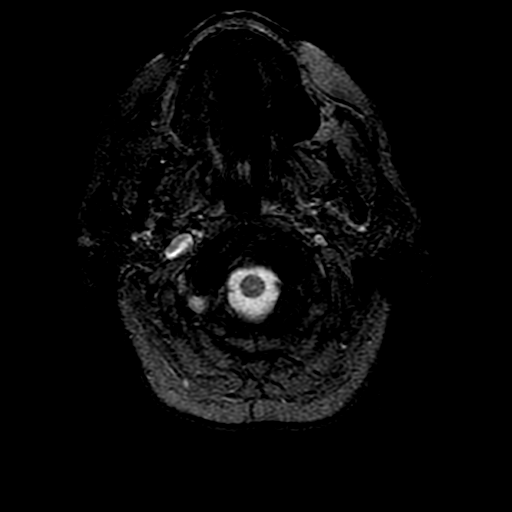
[im 20/20]
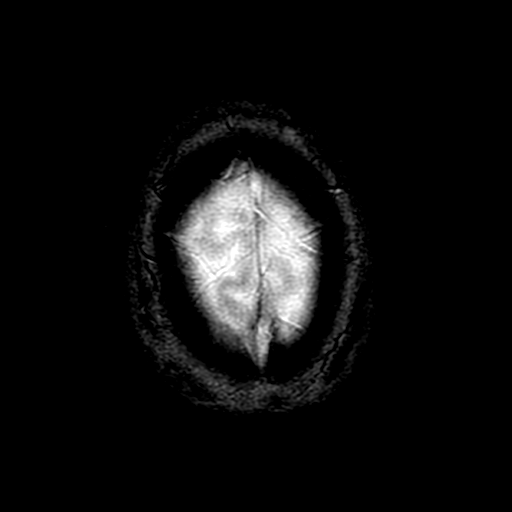

[Series 8: T1 · axial · non-contrast · 3.0mm · 0.31mm/px · 1 of 10 slices shown (1 of 2)]
[im 1/10]
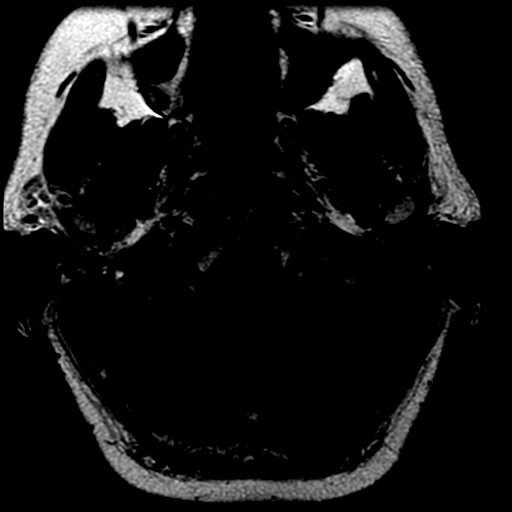

[Series 9: T1 · coronal · non-contrast · 3.0mm · 0.31mm/px · 1 of 10 slices shown (2 of 2)]
[im 1/10]
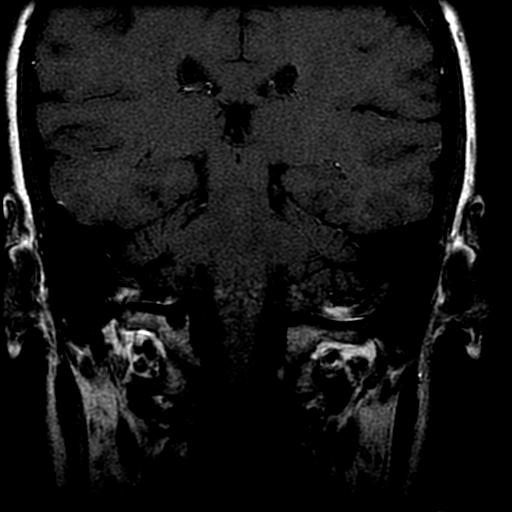

[Series 13: cor mtse-post · coronal · 5.0mm · 0.43mm/px · 3 of 26 slices shown]
[im 1/26]
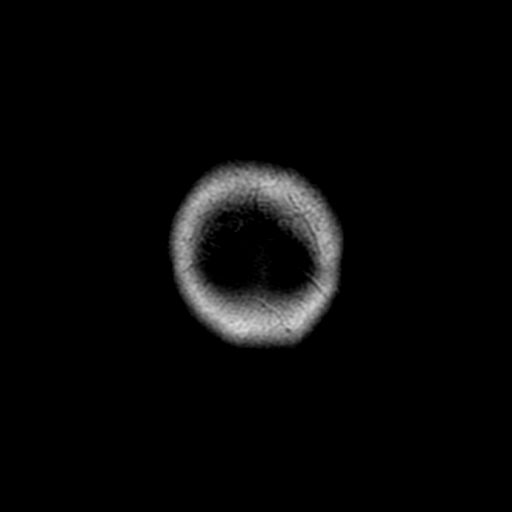
[im 13/26]
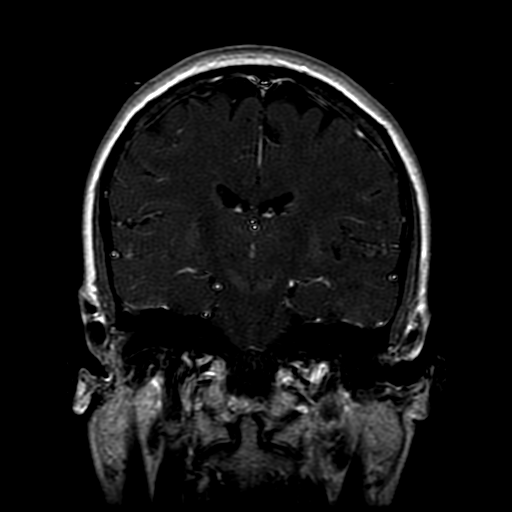
[im 26/26]
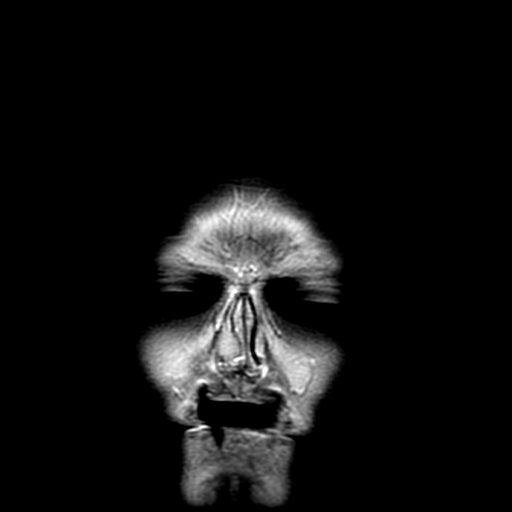

[Series 14: T1 post-contrast · coronal · 3.0mm · 0.31mm/px · 1 of 10 slices shown (1 of 2)]
[im 1/10]
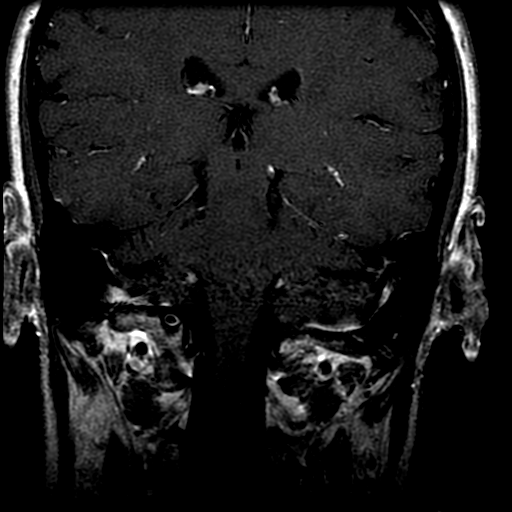

[Series 15: T1 post-contrast · axial · 3.0mm · 0.31mm/px · 1 of 10 slices shown (2 of 2)]
[im 1/10]
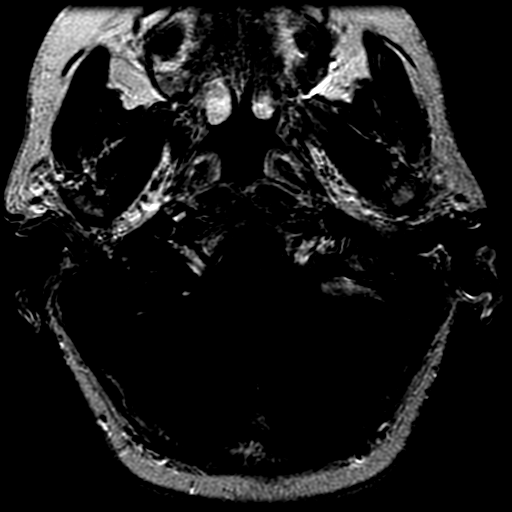

[Series 200: DWI · axial · 5.0mm · 1.25mm/px · z∈[-31,+112]mm · 3 of 27 slices shown (2 of 3)]
[im 1/27]
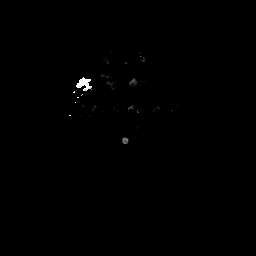
[im 14/27]
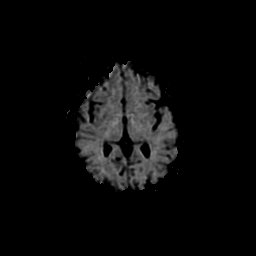
[im 27/27]
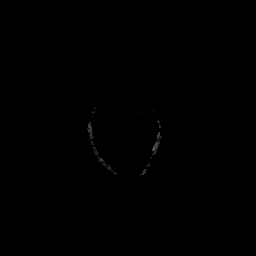

[Series 201: DWI · axial · 5.0mm · 1.25mm/px · z∈[-31,+112]mm · 3 of 27 slices shown (3 of 3)]
[im 1/27]
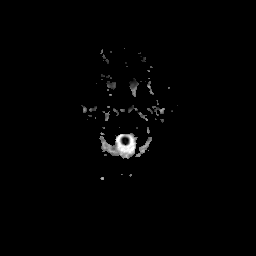
[im 14/27]
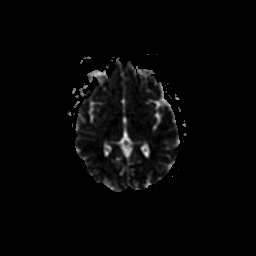
[im 27/27]
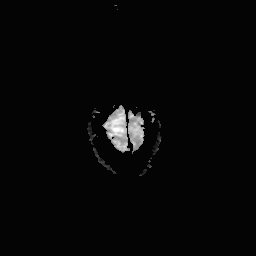

[Series 1000: pjn · axial · 0.8mm · 0.35mm/px · 1 of 9 slices shown]
[im 1/9]
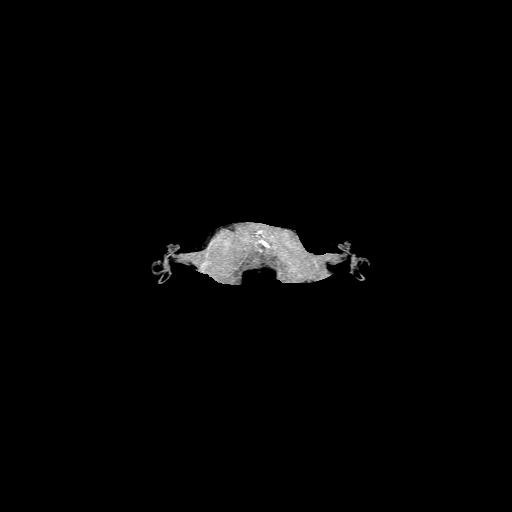

[27 of 48 positions shown; findings below may reference images not displayed]

FINDINGS: Restricted diffusion.  No midline shift,
ventriculomegaly, mass effect, extra-axial collection or
intracranial hemorrhage.  Major vascular flow voids, mastoids,
paranasal sinuses and orbits are normal.  Gray and white matter
signal is normal throughout the brain.  Cerebral volume is normal.
No abnormal enhancement of the brain following contrast.

Thin slice pre- and postcontrast imaging of the internal auditory
canals and cerebellopontine angles reveals a normal appearance of
the bilateral seventh, eighth cranial nerves, cochlea and
vestibular apparatus.  No abnormal enhancement is identified.
IMPRESSION: 1.  No acute intracranial abnormality.
2.  Normal brain.  Normal IAC imaging.

## 2010-01-04 IMAGING — CT CT L SPINE W/ CM
2 of 17 series · 4 of 27 positions shown, 5 images · IV contrast (omnipaque)
Comparison: MRI cervical spine 05/26/2006
COMPARISON: Lumbar MRI 05/26/2006

CLINICAL DATA: Pain and bilateral arm pain.

MYELOGRAM CERVICAL
TECHNIQUE: Lumbar puncture and injection of Omnipaque contrast was
performed Dr. Apestegui.  Fluoroscopic images of the cervical and
lumbar spine were obtained.
CLINICAL DATA: Pain and bilateral arm pain
CT MYELOGRAPHY CERVICAL SPINE
CLINICAL DATA: Low back pain
CT MYELOGRAPHY LUMBAR SPINE
TECHNIQUE: Multidetector CT imaging of the lumbar spine was
performed following myelography.  Multiplanar CT image
reconstructions were also generated.

[Series 6: recon 2: l-spine helical · axial · 0.27mm/px · z∈[-693,-448]mm · 3 of 99 slices shown, 4 images]
[im 1/99  soft-tissue]
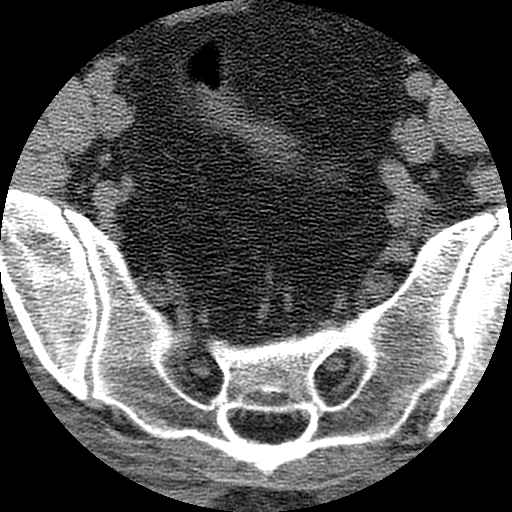
[im 1/99  bone]
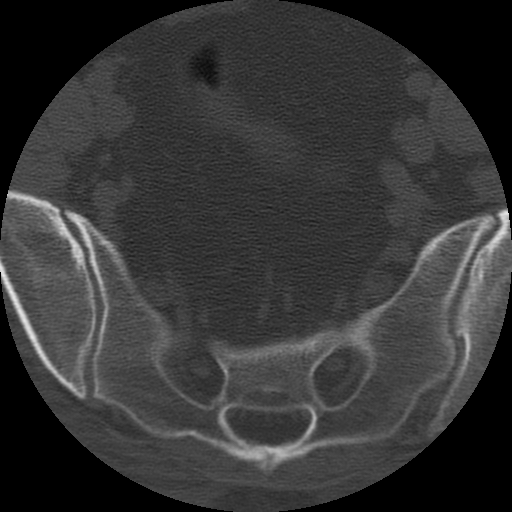
[im 50/99  bone]
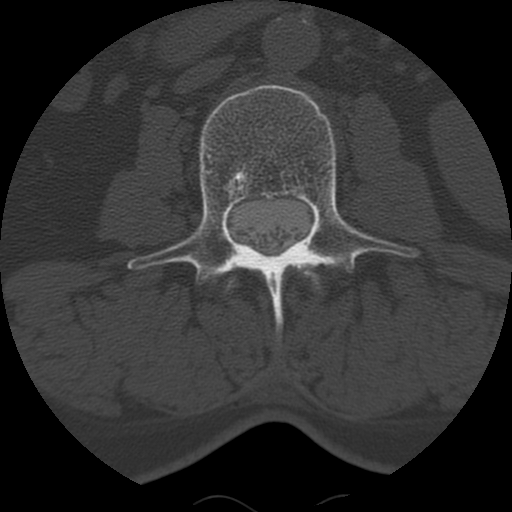
[im 99/99  bone]
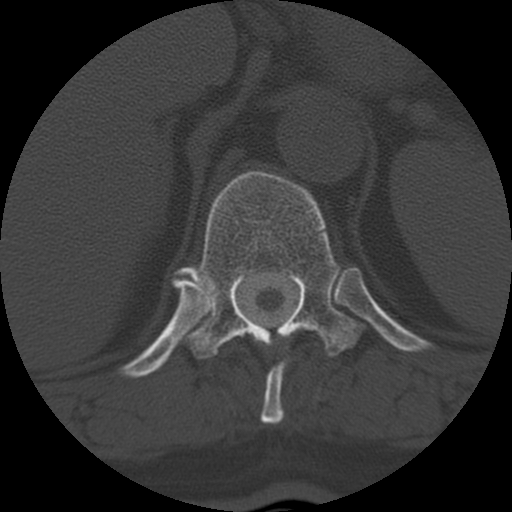

[Series 106: sag lumbar st · sagittal · 0.49mm/px · 1 of 61 slices shown]
[im 31/61  bone]
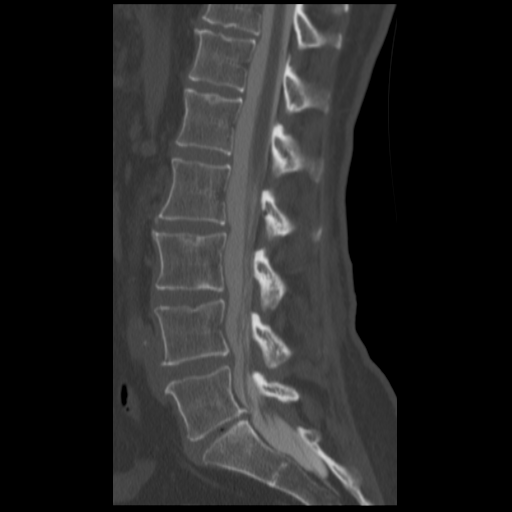

[4 of 27 positions shown; findings below may reference images not displayed]

There is disc degeneration and spondylosis at C5-6 and C6-7.  There
is biforaminal narrowing at C6-7 due to uncinate spurring.  Mild
anterior extra dural defects are present at C5-6 and C6-7.
There is no acute bony abnormality.
IMPRESSION: Cervical disc degeneration and spondylosis at C5-6 and
C6-7
FINDINGS: [The cervical alignment is normal there is no fracture
or mass lesion.  The cord is normal in size.

C2-3:  Negative

C3-4:  Mild disc degeneration with a small central disc protrusion.
There is no spinal stenosis and  the neural foramina are patent.

C4-5:  Disc degeneration with disc space narrowing and mild
uncinate spurring bilaterally.  There is very mild facet
arthropathy without spinal stenosis.

C5-6:  Moderate disc degeneration and spondylosis.  There is a
central uncinate osteophyte which is narrowing the anterior
subarachnoid space but not causing any cord deformity.  There is
very mild facet arthropathy.  There is no foraminal narrowing.

C6-7:  There is mild disc degeneration and spondylosis.  Central
and  left-sided uncinate spurring contributes to moderate left
foraminal encroachment.  There is mild central canal stenosis
particularly on the left side.

C7-T1:  Small central disc protrusion.

The findings are similar to the prior MRI.]
IMPRESSION: [Cervical degenerative changes and spondylosis particularly  C5-6
and C6-7. Most notable is  left foraminal encroachment C6-7 which
is similar to the MRI in 0883.
FINDINGS: Normal lumbar alignment.  No fracture or mass lesion is
seen the conus medullaris is normal.

T11-12:  Very small left paracentral disc protrusion

T12-L1:  Negative

L1-2: Negative

L2-3: Disc degeneration with disc bulging.  No significant
stenosis.

L3-4: Disc degeneration with disc bulging.  There is mild facet and
ligamentum flavum hypertrophy without spinal stenosis.

L4-5: Disc bulging and spondylosis.  There is facet and ligamentum
flavum hypertrophy and mild to moderate spinal stenosis.  This has
progressed slightly from the prior MRI.

L5-S1: Disc space narrowing is present with posterior vertebral
spurring which has progressed in the interval.  There is
progression of biforaminal stenosis since prior study.  Central
canal is mildly narrowed.
IMPRESSION: There is been mild progression of disc degeneration and spondylosis
at L4-5 and L5- S1 since the MRI 0883.  There is a very small left
paracentral disc protrusion at T11-T12, this level was not covered
on the prior MRI

## 2010-01-04 IMAGING — CR IR MYELOGRAM [PERSON_NAME]
2 series · 2 of 2 positions shown · IV contrast (omnipaque)
Comparison: MRI cervical spine 05/26/2006
COMPARISON: Lumbar MRI 05/26/2006

CLINICAL DATA: Pain and bilateral arm pain.

MYELOGRAM CERVICAL
TECHNIQUE: Lumbar puncture and injection of Omnipaque contrast was
performed Dr. Apestegui.  Fluoroscopic images of the cervical and
lumbar spine were obtained.
CLINICAL DATA: Pain and bilateral arm pain
CT MYELOGRAPHY CERVICAL SPINE
CLINICAL DATA: Low back pain
CT MYELOGRAPHY LUMBAR SPINE
TECHNIQUE: Multidetector CT imaging of the lumbar spine was
performed following myelography.  Multiplanar CT image
reconstructions were also generated.

[view not recorded (1 of 2)]
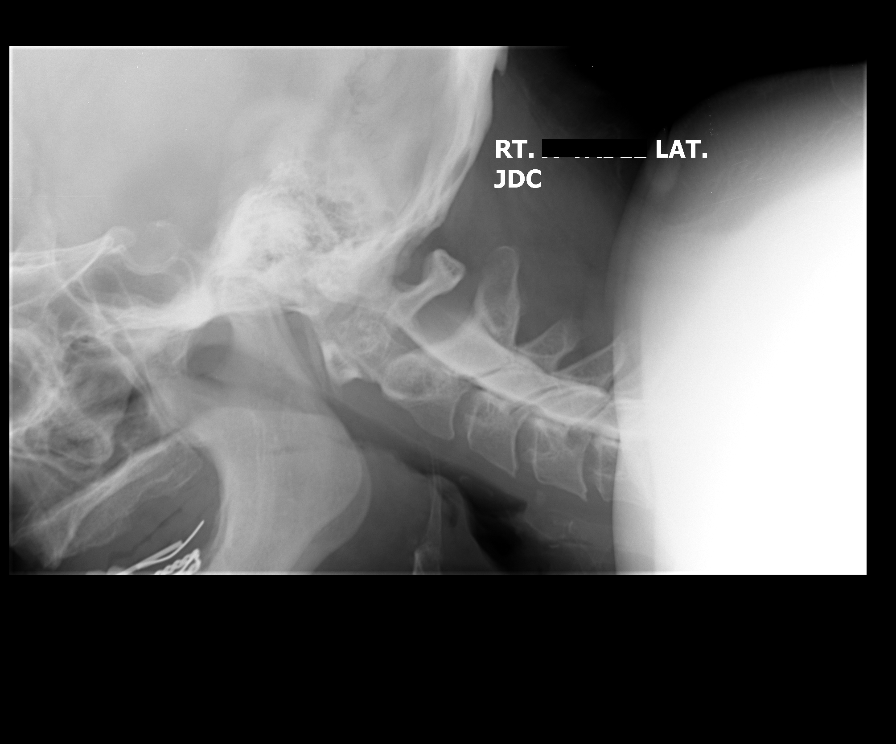

[view not recorded (2 of 2)]
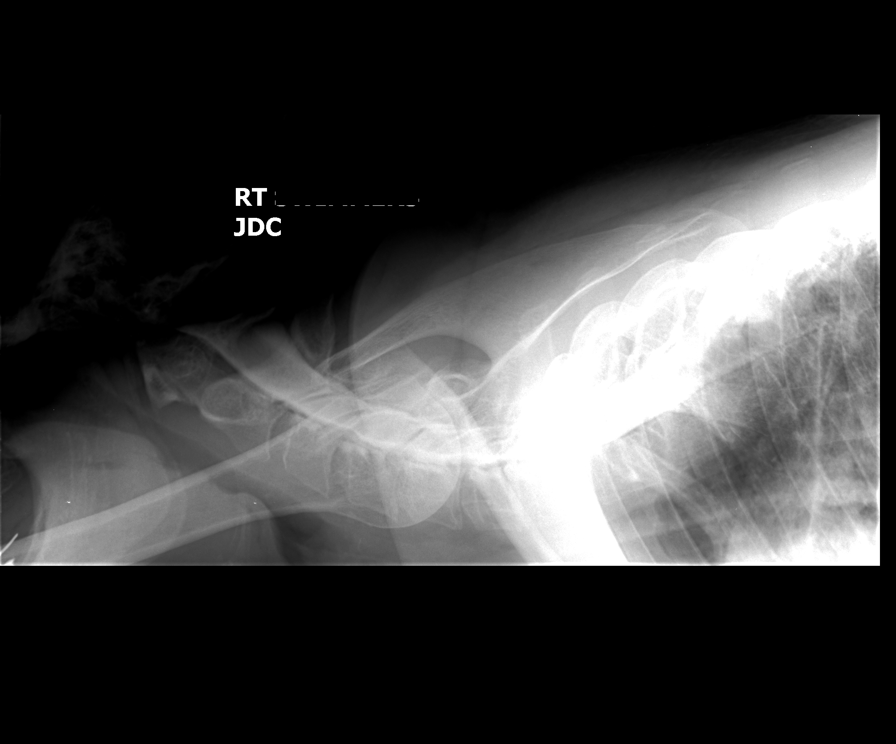

[2 of 2 positions shown; findings below may reference images not displayed]

There is disc degeneration and spondylosis at C5-6 and C6-7.  There
is biforaminal narrowing at C6-7 due to uncinate spurring.  Mild
anterior extra dural defects are present at C5-6 and C6-7.
There is no acute bony abnormality.
IMPRESSION: Cervical disc degeneration and spondylosis at C5-6 and
C6-7
FINDINGS: [The cervical alignment is normal there is no fracture
or mass lesion.  The cord is normal in size.

C2-3:  Negative

C3-4:  Mild disc degeneration with a small central disc protrusion.
There is no spinal stenosis and  the neural foramina are patent.

C4-5:  Disc degeneration with disc space narrowing and mild
uncinate spurring bilaterally.  There is very mild facet
arthropathy without spinal stenosis.

C5-6:  Moderate disc degeneration and spondylosis.  There is a
central uncinate osteophyte which is narrowing the anterior
subarachnoid space but not causing any cord deformity.  There is
very mild facet arthropathy.  There is no foraminal narrowing.

C6-7:  There is mild disc degeneration and spondylosis.  Central
and  left-sided uncinate spurring contributes to moderate left
foraminal encroachment.  There is mild central canal stenosis
particularly on the left side.

C7-T1:  Small central disc protrusion.

The findings are similar to the prior MRI.]
IMPRESSION: [Cervical degenerative changes and spondylosis particularly  C5-6
and C6-7. Most notable is  left foraminal encroachment C6-7 which
is similar to the MRI in 0883.
FINDINGS: Normal lumbar alignment.  No fracture or mass lesion is
seen the conus medullaris is normal.

T11-12:  Very small left paracentral disc protrusion

T12-L1:  Negative

L1-2: Negative

L2-3: Disc degeneration with disc bulging.  No significant
stenosis.

L3-4: Disc degeneration with disc bulging.  There is mild facet and
ligamentum flavum hypertrophy without spinal stenosis.

L4-5: Disc bulging and spondylosis.  There is facet and ligamentum
flavum hypertrophy and mild to moderate spinal stenosis.  This has
progressed slightly from the prior MRI.

L5-S1: Disc space narrowing is present with posterior vertebral
spurring which has progressed in the interval.  There is
progression of biforaminal stenosis since prior study.  Central
canal is mildly narrowed.
IMPRESSION: There is been mild progression of disc degeneration and spondylosis
at L4-5 and L5- S1 since the MRI 0883.  There is a very small left
paracentral disc protrusion at T11-T12, this level was not covered
on the prior MRI

## 2010-01-25 ENCOUNTER — Emergency Department (HOSPITAL_COMMUNITY): Admission: EM | Admit: 2010-01-25 | Discharge: 2010-01-25 | Payer: Self-pay | Admitting: Family Medicine

## 2010-02-01 IMAGING — US US MISC SOFT TISSUE
1 series · 14 of 16 positions shown · non-contrast
Comparison: None

CLINICAL DATA: Probable lumps in the anterior right thigh.

SOFT TISSUE ULTRASOUND - MISCELLANEOUS

[Series 1: unknown · 0.06mm/px · 14 of 24 slices shown]
[im 1/24]
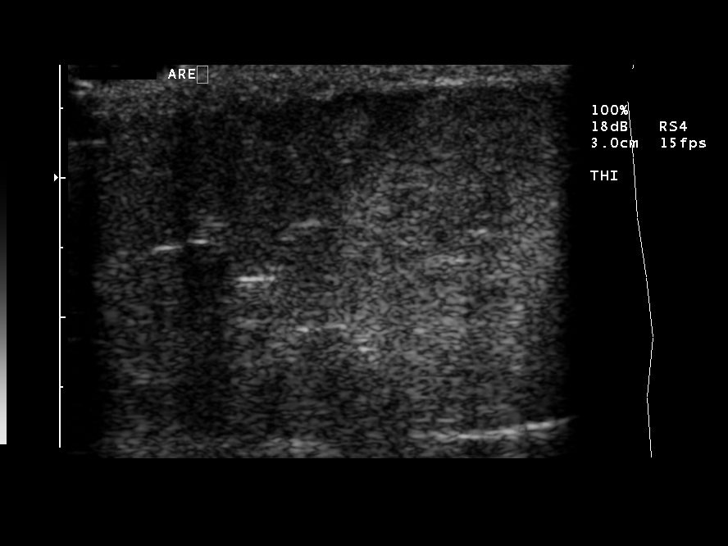
[im 2/24]
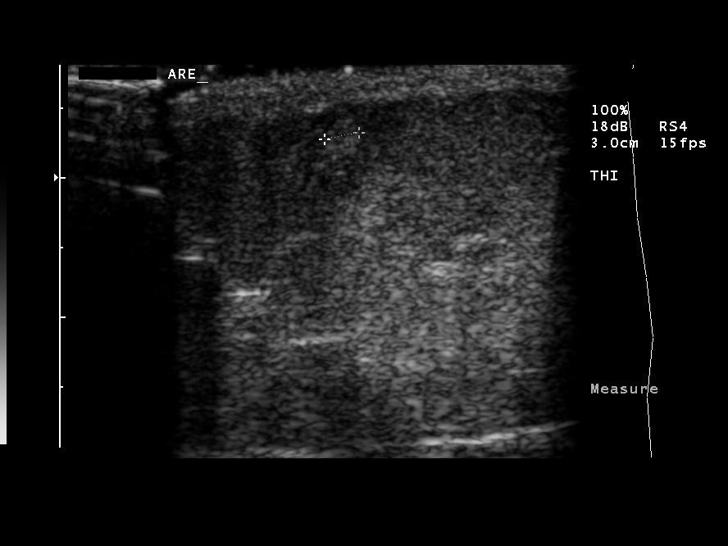
[im 4/24]
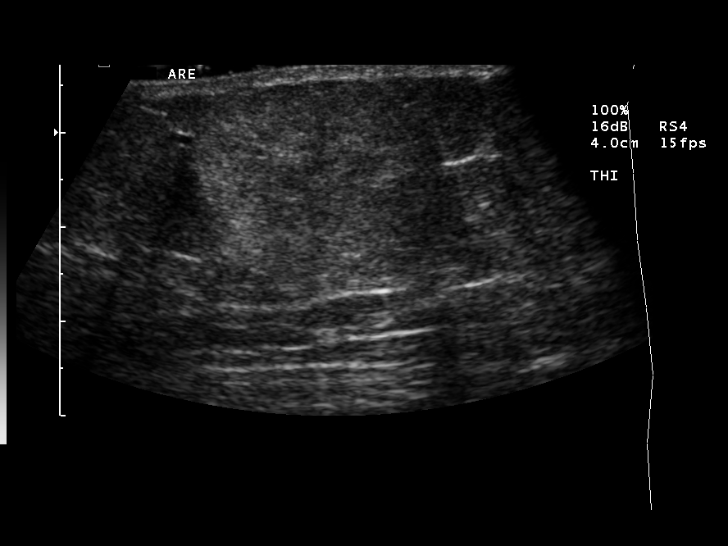
[im 7/24]
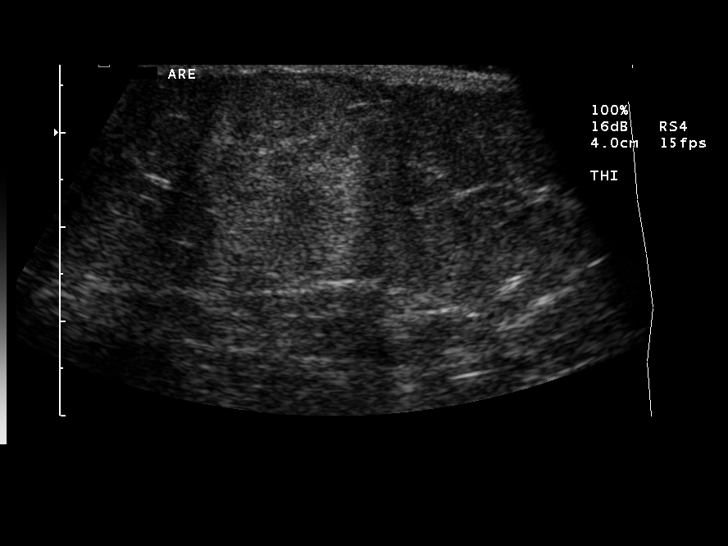
[im 8/24]
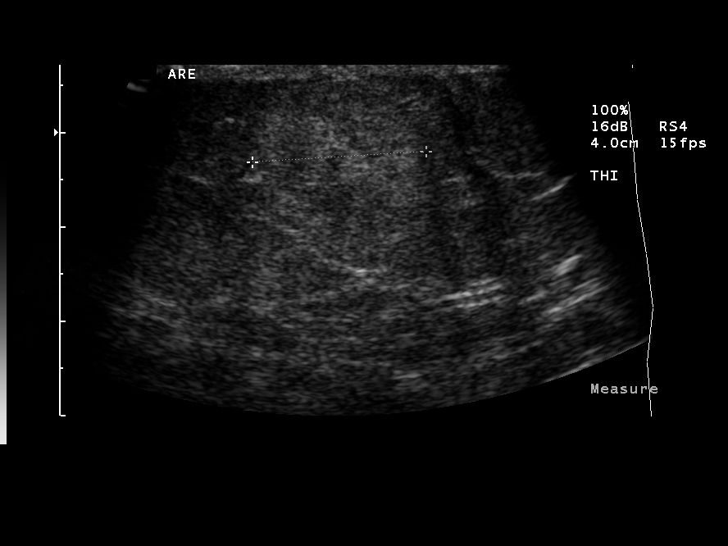
[im 10/24]
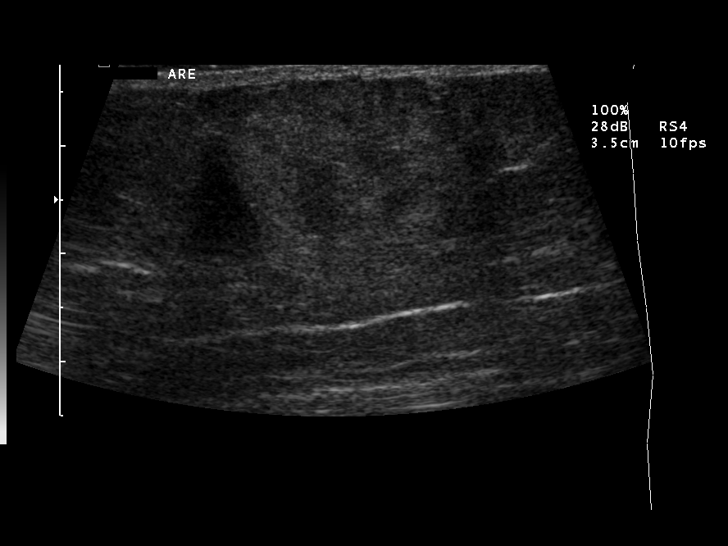
[im 11/24]
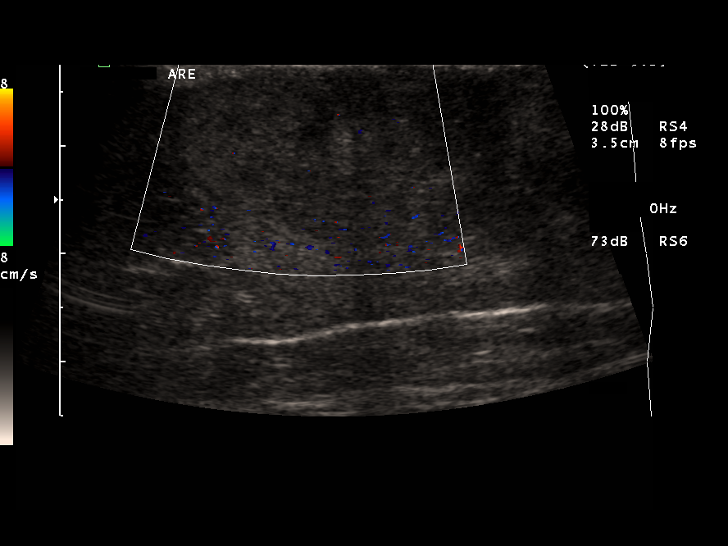
[im 13/24]
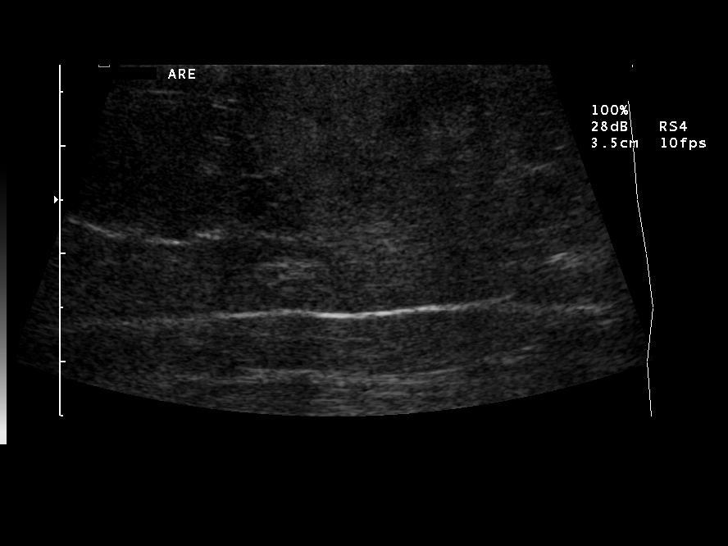
[im 14/24]
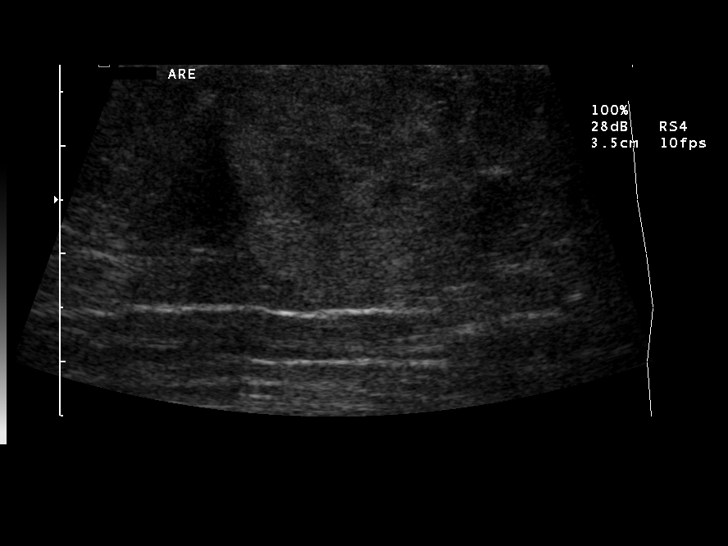
[im 16/24]
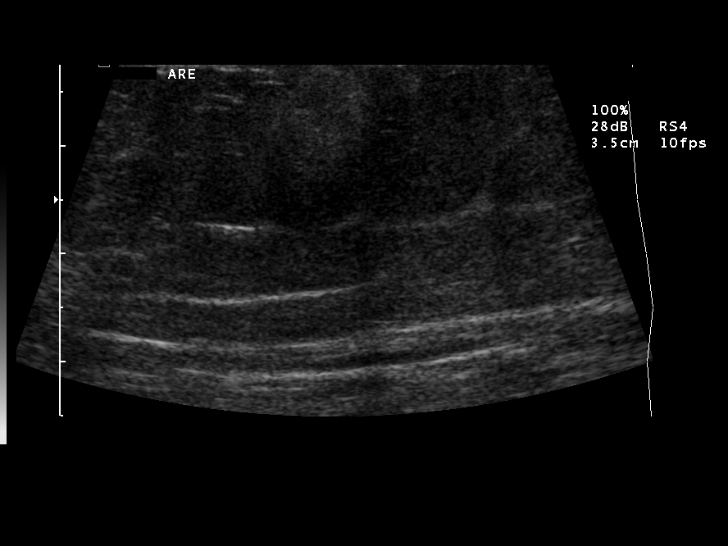
[im 19/24]
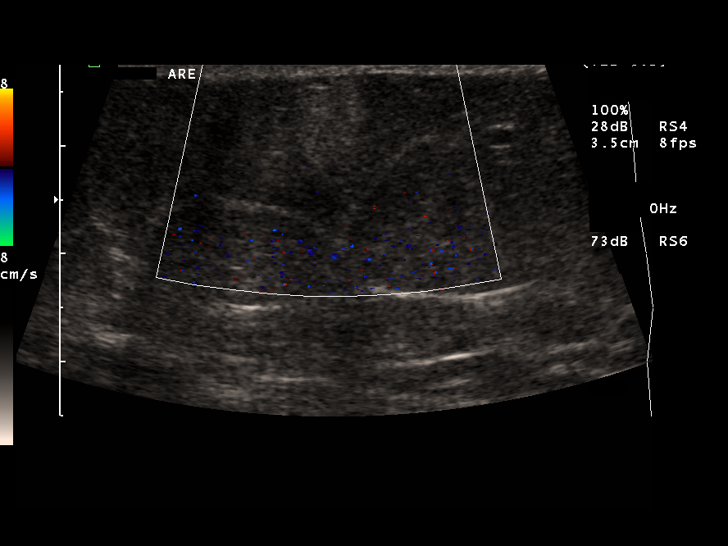
[im 20/24]
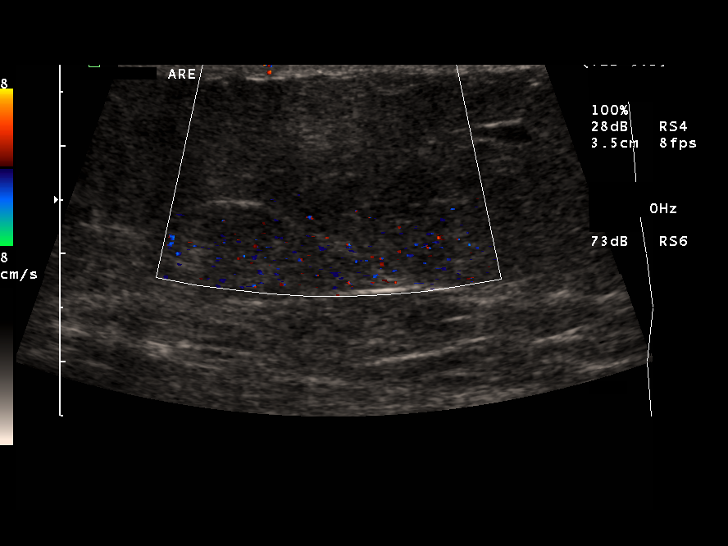
[im 22/24]
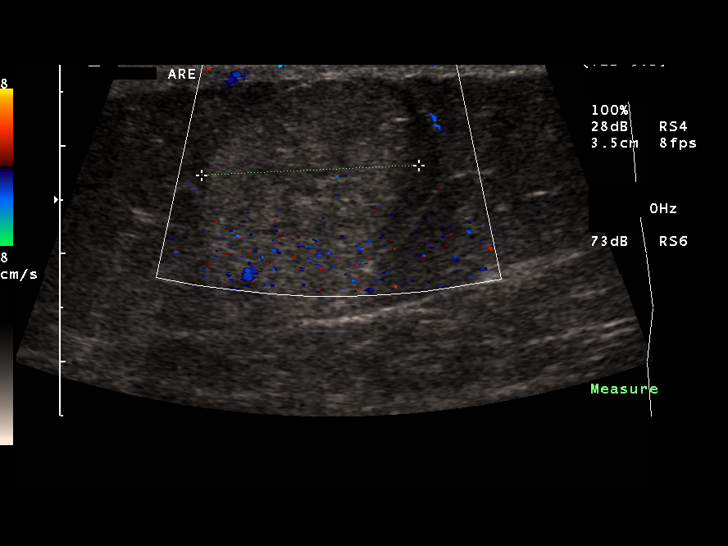
[im 24/24]
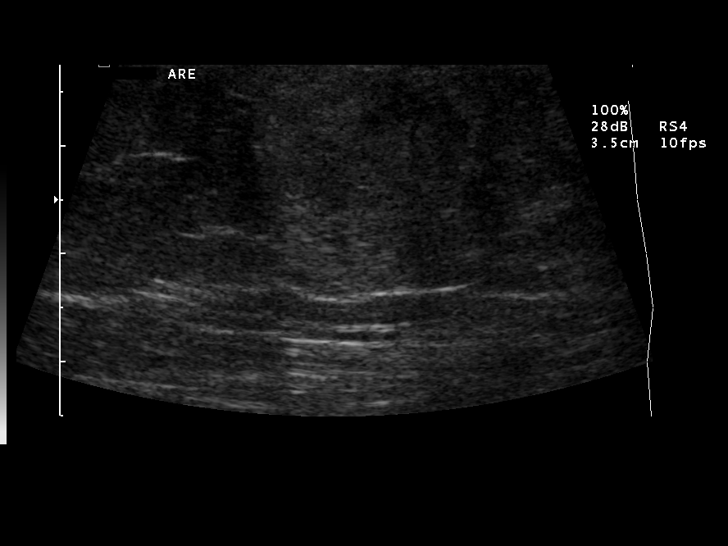

[14 of 16 positions shown; findings below may reference images not displayed]

FINDINGS: A few small palpable nodules within the anterior right
thigh are identified on physical examination.  These nodules are
firm but mobile.

Ultrasound evaluation of the anterior right thigh demonstrates two
rounded hyperechoic lesions, the first measuring 2.5 mm x 2.5 mm
and the second measuring 20 x 16 x 20 mm.  The larger lesion is
mildly heterogeneous.
IMPRESSION: Two hyperechoic lesions within the anterior right thigh
corresponding to palpable lesions.  These most likely are fatty
lesions, but given mild heterogeneity of the larger 20 mm lesion,
either ultrasound follow-up in 3 months, MRI of the right thigh, or
further evaluation recommended.

## 2010-03-02 IMAGING — MR MR [PERSON_NAME] LOW WO/W CM*L*
8 of 16 series · 20 of 40 positions shown · IV contrast (multihance)
Comparison: None

CLINICAL DATA: Bilateral thigh masses

MRI RIGHT THIGH WITH CONTRAST
TECHNIQUE: Multiplanar, multisequence MR imaging of the right
thigh was performed following the administration of intravenous
contrast.
Contrast: 17 ml multihance

[Series 3: T1 · coronal · 5.0mm · 0.94mm/px · 2 of 26 slices shown (1 of 4)]
[im 1/26]
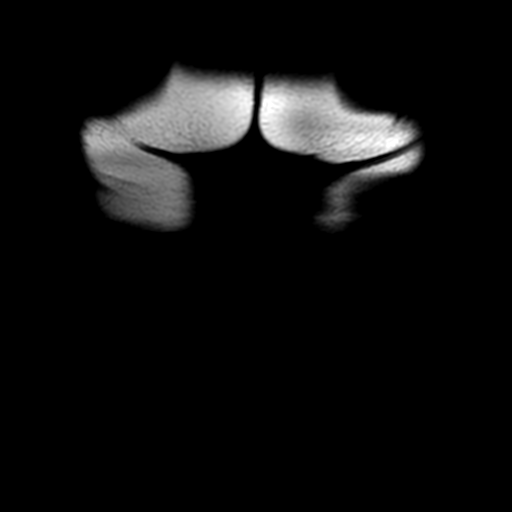
[im 26/26]
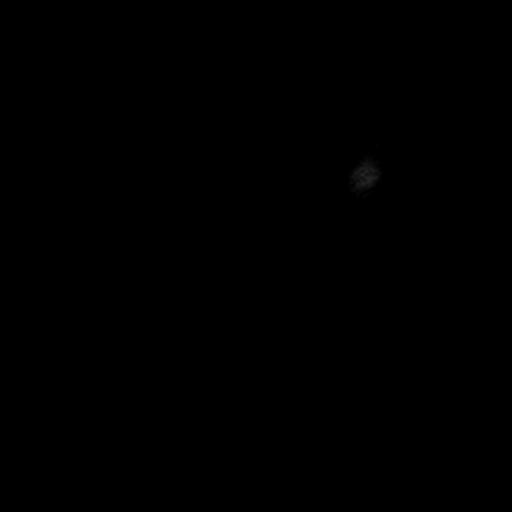

[Series 5: T1 · axial · 7.0mm · 0.78mm/px · z∈[-197,+261]mm · 4 of 51 slices shown (2 of 4)]
[im 1/51]
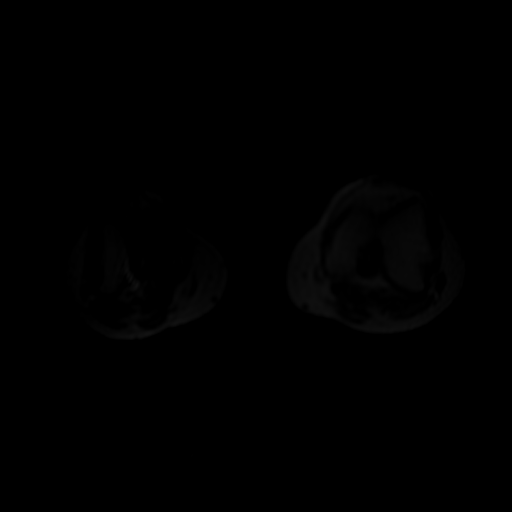
[im 17/51]
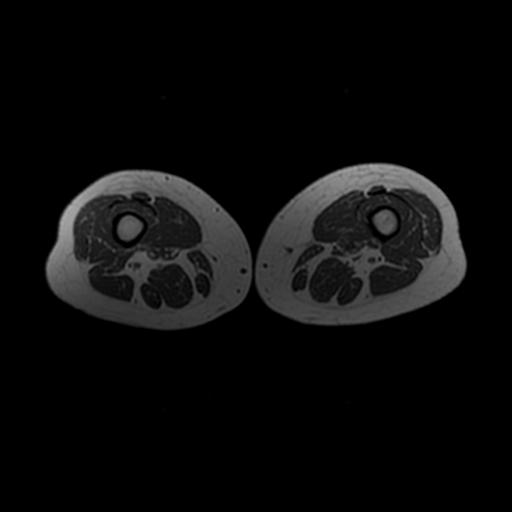
[im 34/51]
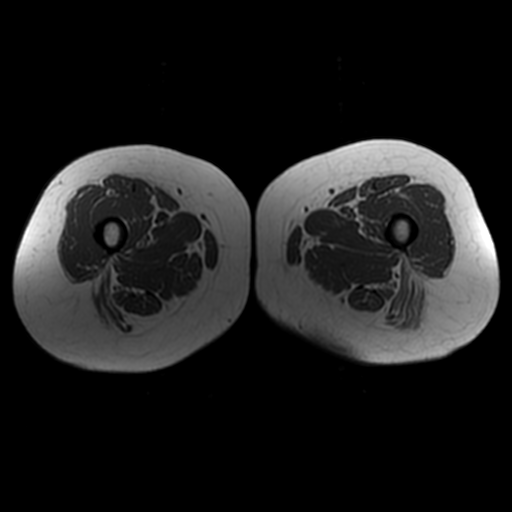
[im 51/51]
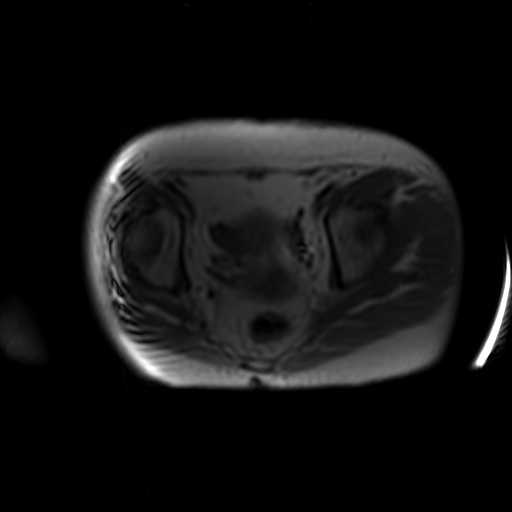

[Series 6: PD · axial · 7.0mm · 0.78mm/px · z∈[-197,+261]mm · 4 of 52 slices shown]
[im 1/52]
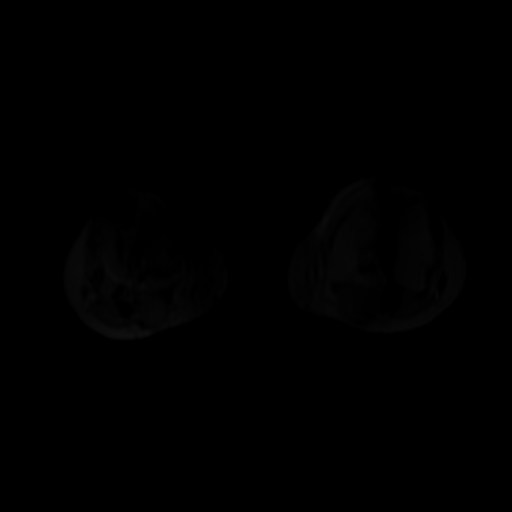
[im 18/52]
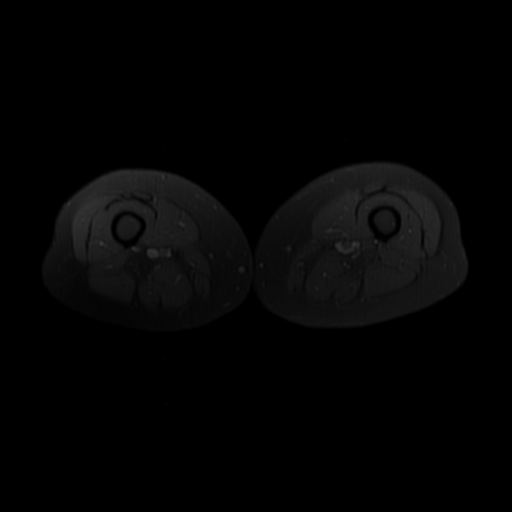
[im 35/52]
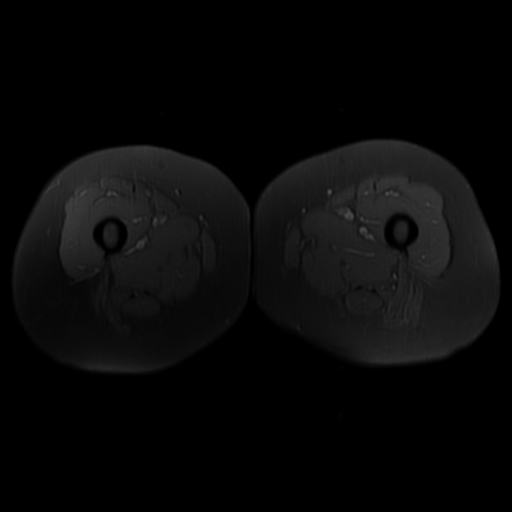
[im 52/52]
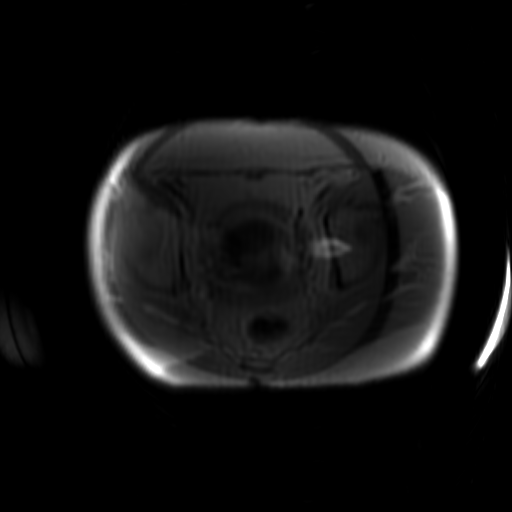

[Series 8: T1 · sagittal · 5.0mm · 0.94mm/px · 2 of 32 slices shown (3 of 4)]
[im 1/32]
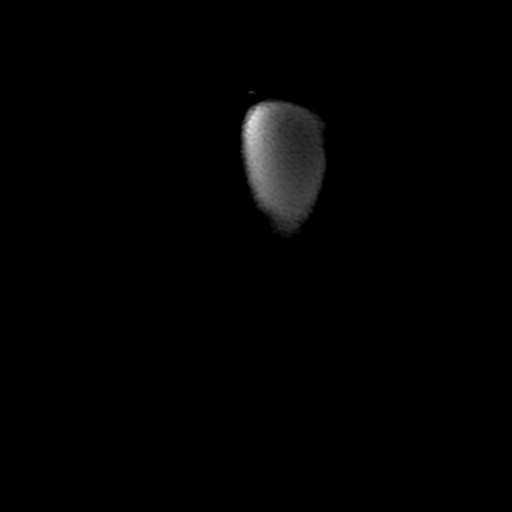
[im 32/32]
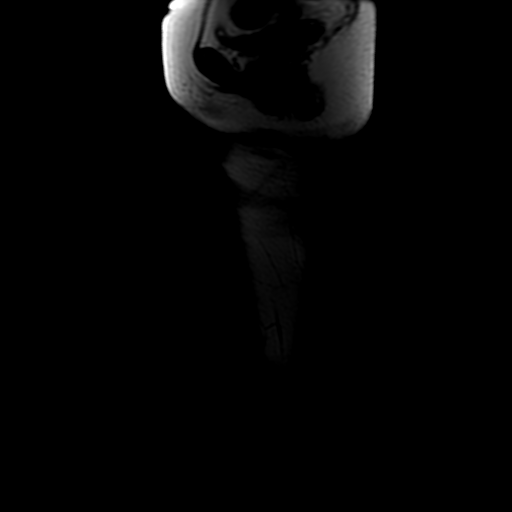

[Series 10: T1 · sagittal · 5.0mm · 0.94mm/px · 2 of 30 slices shown (4 of 4)]
[im 1/30]
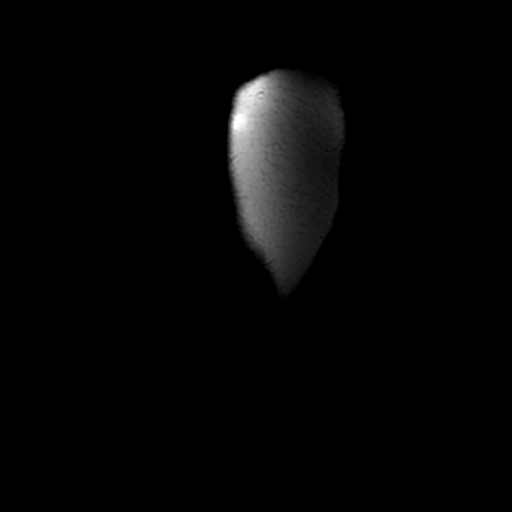
[im 30/30]
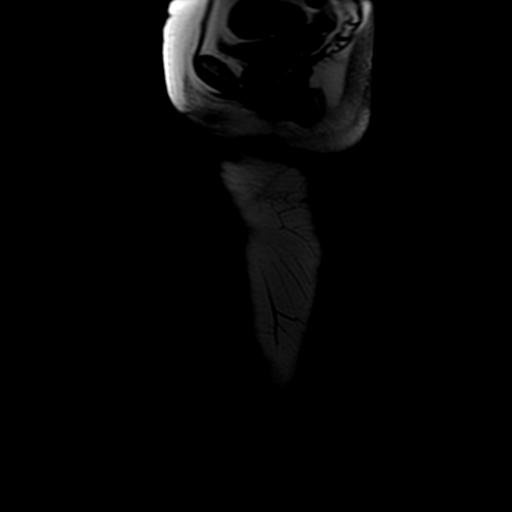

[Series 14: T1 post-contrast · coronal · 5.0mm · 0.94mm/px · 2 of 26 slices shown (1 of 3)]
[im 1/26]
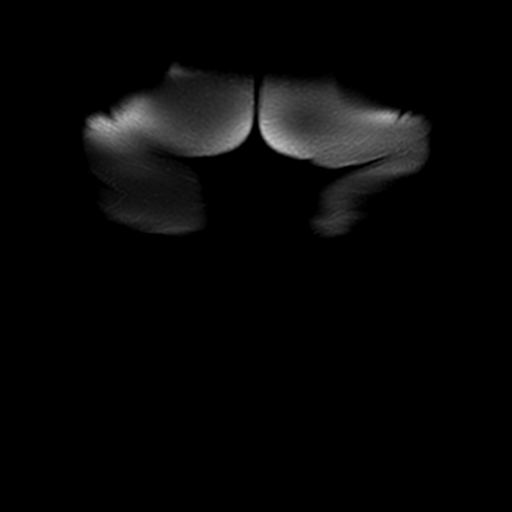
[im 26/26]
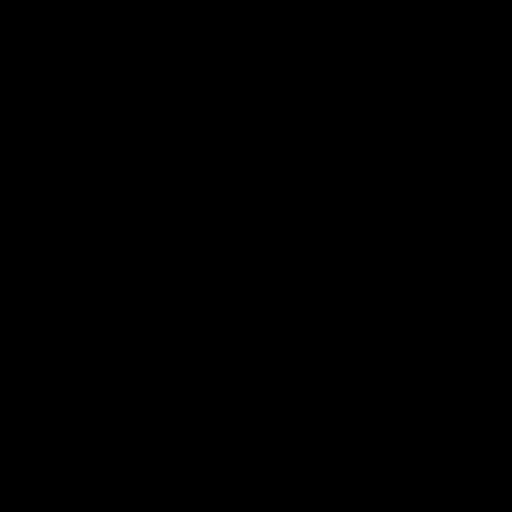

[Series 15: T1 post-contrast · sagittal · 5.0mm · 0.94mm/px · 2 of 32 slices shown (2 of 3)]
[im 1/32]
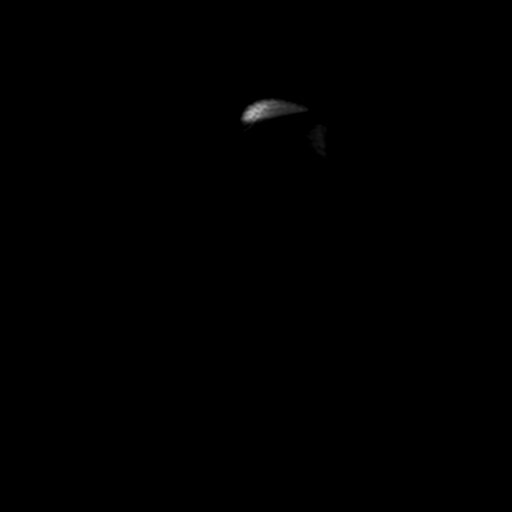
[im 32/32]
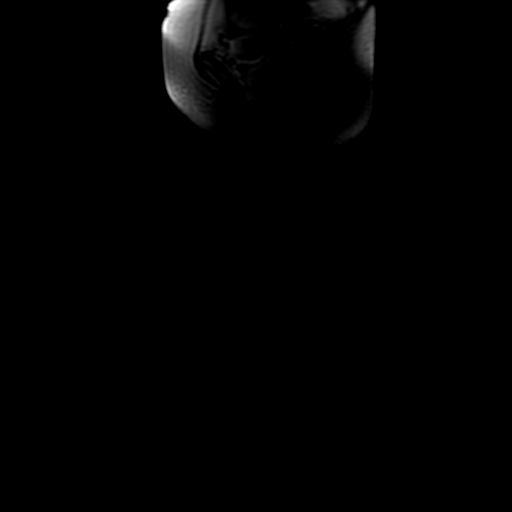

[Series 16: T1 post-contrast · sagittal · 5.0mm · 0.94mm/px · 2 of 30 slices shown (3 of 3)]
[im 1/30]
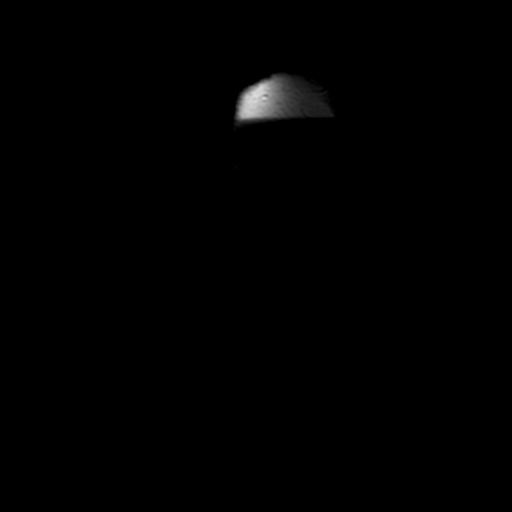
[im 30/30]
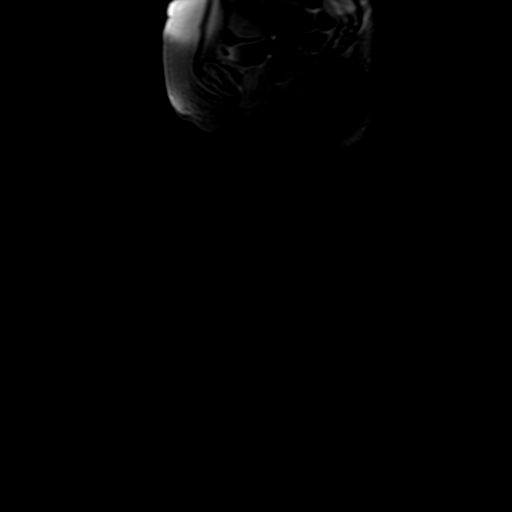

[20 of 40 positions shown; findings below may reference images not displayed]

FINDINGS: Extensive subcutaneous fatty tissue.  No abnormal soft
tissue masses.  No inflammatory changes appreciated.  The
musculature has a normal appearance.  The right femur has a normal
appearance.  The vasculature is normal.
IMPRESSION: MRI of the right thigh is negative for abnormal masses. I question
whether the palpable abnormality represents underlying subcutaneous
fatty tissue.

## 2010-03-12 ENCOUNTER — Telehealth (INDEPENDENT_AMBULATORY_CARE_PROVIDER_SITE_OTHER): Payer: Self-pay | Admitting: *Deleted

## 2010-05-14 ENCOUNTER — Ambulatory Visit: Payer: Self-pay | Admitting: Internal Medicine

## 2010-05-14 DIAGNOSIS — L259 Unspecified contact dermatitis, unspecified cause: Secondary | ICD-10-CM | POA: Insufficient documentation

## 2010-05-19 LAB — CONVERTED CEMR LAB: Vit D, 25-Hydroxy: 35 ng/mL (ref 30–89)

## 2010-06-06 IMAGING — CR DG FOOT COMPLETE 3+V*R*
3 series · 3 of 3 positions shown · non-contrast
Comparison: None

CLINICAL DATA: Foreign body between the third and fourth digits.
Pain and swelling

RIGHT FOOT COMPLETE - 3+ VIEW

[view not recorded (1 of 3)]
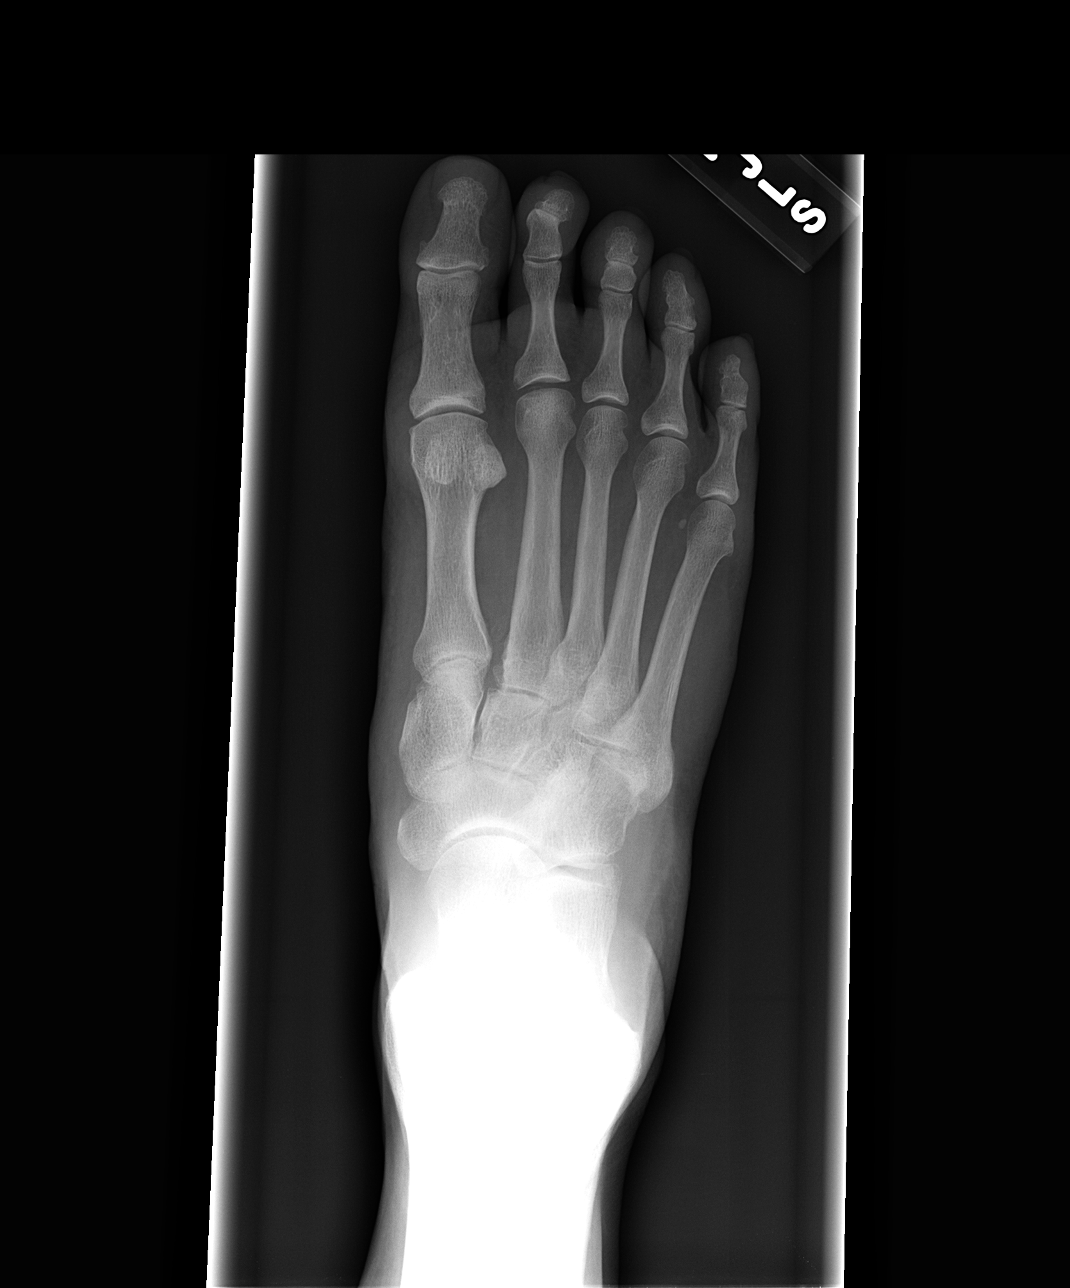

[view not recorded (2 of 3)]
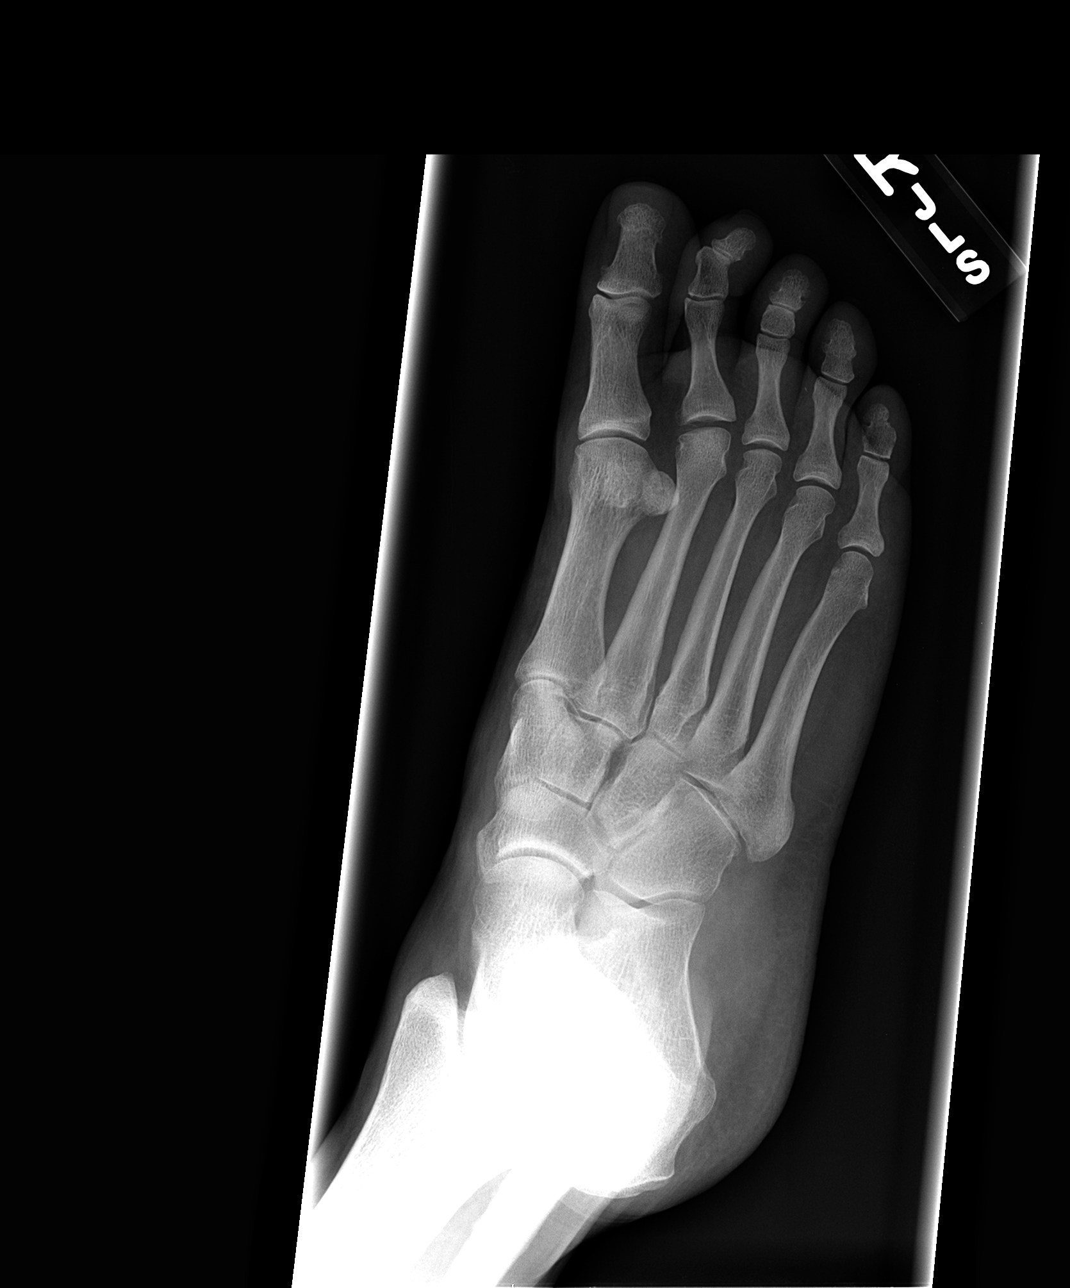

[view not recorded (3 of 3)]
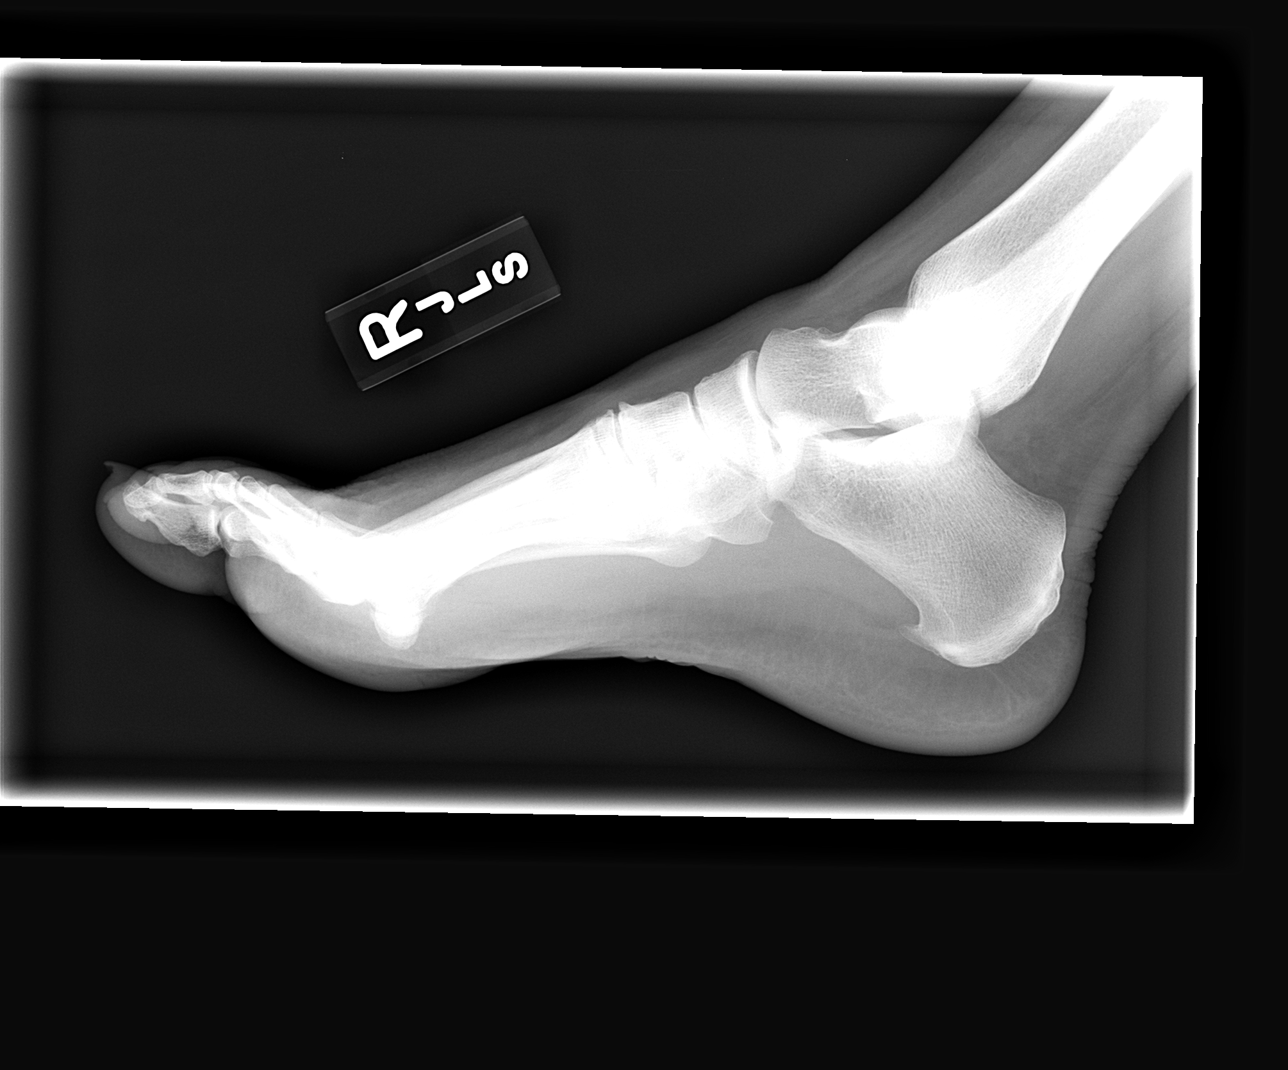

[3 of 3 positions shown; findings below may reference images not displayed]

FINDINGS: The joint spaces are maintained.  There is slight
flattening of the second metatarsal head and some associated
degenerative changes at this joint.  This could be due to remote
Freibergs infraction..  On the oblique films a tiny radiopaque
density along the medial margin of the proximal phalanx of the
fourth digit.  I do not see this for certain on the other views.
It could be a foreign body. No fractures are seen.  A calcaneal
heel spur is noted.
IMPRESSION: 1.  No acute bony findings.
2.  Possible tiny radiopaque foreign body in the medial soft
tissues of the fourth digit.

## 2010-06-16 ENCOUNTER — Emergency Department (HOSPITAL_COMMUNITY)
Admission: EM | Admit: 2010-06-16 | Discharge: 2010-06-16 | Payer: Self-pay | Source: Home / Self Care | Admitting: Emergency Medicine

## 2010-06-18 LAB — POCT CARDIAC MARKERS
CKMB, poc: <1 ng/mL — ABNORMAL LOW (ref 1.0–8.0)
CKMB, poc: <1 ng/mL — ABNORMAL LOW (ref 1.0–8.0)
Myoglobin, poc: 60.7 ng/mL (ref 12–200)
Myoglobin, poc: 42.9 ng/mL (ref 12–200)
Troponin i, poc: <0.05 ng/mL (ref 0.00–0.09)
Troponin i, poc: <0.05 ng/mL (ref 0.00–0.09)

## 2010-06-18 LAB — CBC
HCT: 43.2 % (ref 36.0–46.0)
Hemoglobin: 14.3 g/dL (ref 12.0–15.0)
MCH: 29.8 pg (ref 26.0–34.0)
MCHC: 33.1 g/dL (ref 30.0–36.0)
MCV: 90 fL (ref 78.0–100.0)
Platelets: 209 10*3/uL (ref 150–400)
RBC: 4.8 MIL/uL (ref 3.87–5.11)
RDW: 13 % (ref 11.5–15.5)
WBC: 6.3 10*3/uL (ref 4.0–10.5)

## 2010-06-18 LAB — DIFFERENTIAL
Basophils Absolute: 0 10*3/uL (ref 0.0–0.1)
Basophils Relative: 0 % (ref 0–1)
Eosinophils Absolute: 0.1 10*3/uL (ref 0.0–0.7)
Eosinophils Relative: 1 % (ref 0–5)
Lymphocytes Relative: 15 % (ref 12–46)
Lymphs Abs: 0.9 10*3/uL (ref 0.7–4.0)
Monocytes Absolute: 0.3 10*3/uL (ref 0.1–1.0)
Monocytes Relative: 5 % (ref 3–12)
Neutro Abs: 5 10*3/uL (ref 1.7–7.7)
Neutrophils Relative %: 79 % — ABNORMAL HIGH (ref 43–77)

## 2010-06-18 LAB — COMPREHENSIVE METABOLIC PANEL
ALT: 16 U/L (ref 0–35)
AST: 16 U/L (ref 0–37)
Albumin: 3.9 g/dL (ref 3.5–5.2)
Alkaline Phosphatase: 73 U/L (ref 39–117)
BUN: 13 mg/dL (ref 6–23)
CO2: 29 mEq/L (ref 19–32)
Calcium: 9.7 mg/dL (ref 8.4–10.5)
Chloride: 108 mEq/L (ref 96–112)
Creatinine, Ser: 0.63 mg/dL (ref 0.4–1.2)
GFR calc Af Amer: >60 mL/min (ref >60)
GFR calc non Af Amer: >60 mL/min (ref >60)
Glucose, Bld: 96 mg/dL (ref 70–99)
Potassium: 4.7 mEq/L (ref 3.5–5.1)
Sodium: 141 mEq/L (ref 135–145)
Total Bilirubin: 0.4 mg/dL (ref 0.3–1.2)
Total Protein: 7 g/dL (ref 6.0–8.3)

## 2010-06-18 LAB — URINALYSIS, ROUTINE W REFLEX MICROSCOPIC
Bilirubin Urine: NEGATIVE
Hgb urine dipstick: NEGATIVE
Ketones, ur: NEGATIVE mg/dL
Nitrite: NEGATIVE
Protein, ur: NEGATIVE mg/dL
Specific Gravity, Urine: 1.024 (ref 1.005–1.030)
Urine Glucose, Fasting: NEGATIVE mg/dL
Urobilinogen, UA: 0.2 mg/dL (ref 0.0–1.0)
pH: 5 (ref 5.0–8.0)

## 2010-06-18 LAB — URINE CULTURE
Colony Count: NO GROWTH
Culture  Setup Time: 201201141728
Culture: NO GROWTH

## 2010-06-19 ENCOUNTER — Telehealth: Payer: Self-pay | Admitting: *Deleted

## 2010-06-20 ENCOUNTER — Ambulatory Visit: Admit: 2010-06-20 | Payer: Self-pay

## 2010-06-20 ENCOUNTER — Ambulatory Visit
Admission: RE | Admit: 2010-06-20 | Discharge: 2010-06-20 | Payer: Self-pay | Source: Home / Self Care | Attending: Internal Medicine | Admitting: Internal Medicine

## 2010-06-20 LAB — CONVERTED CEMR LAB
Bilirubin Urine: NEGATIVE
Blood in Urine, dipstick: NEGATIVE
Glucose, Urine, Semiquant: NEGATIVE
Ketones, urine, test strip: NEGATIVE
Nitrite: NEGATIVE
Protein, U semiquant: NEGATIVE
Specific Gravity, Urine: 1.01
Urobilinogen, UA: 0.2
WBC Urine, dipstick: NEGATIVE
pH: 6

## 2010-06-24 ENCOUNTER — Encounter: Payer: Self-pay | Admitting: *Deleted

## 2010-06-24 ENCOUNTER — Encounter: Payer: Self-pay | Admitting: Internal Medicine

## 2010-06-24 ENCOUNTER — Encounter: Payer: Self-pay | Admitting: Neurosurgery

## 2010-06-25 ENCOUNTER — Ambulatory Visit: Admit: 2010-06-25 | Payer: Self-pay

## 2010-06-25 DIAGNOSIS — B029 Zoster without complications: Secondary | ICD-10-CM | POA: Insufficient documentation

## 2010-06-26 ENCOUNTER — Ambulatory Visit: Admit: 2010-06-26 | Payer: Self-pay | Admitting: Cardiovascular Disease

## 2010-07-05 NOTE — Progress Notes (Signed)
Summary: Prior Authorization _ Approval   Prior Authorization approval for patient Nicotrol inhaler until 11/18/07. Called and notified patient and pharmacy...................................................................Marland KitchenConcepcion Elk  August 18, 2007 10:52 AM

## 2010-07-05 NOTE — Progress Notes (Signed)
Summary: ? of diabetes  Phone Note Call from Patient Call back at [facility] Phone [phone]   Caller: Patient Reason for Call: Talk to Doctor Summary of Call: The pt is worried that she has DM.  She has been to the [facility] twice recently and was found to have "sugar in her urine".  She then started testing her sugars using her Mom's glucommeter and they have been 174-204 today.  The 174 was fasting this AM and the 204 was after she ate some cheese puffs.  She was just seen on Friday and was told that her A1c was normal.  She asked if she should go to the ER because her sugar was so high.  I advised the pt that her sugar was not high enough to warrant an emergency room visit, but that if her sugars climbed up in to the 400s or so, to call me back or go to the ED.  I advised her to avoid simple sugars, watch her carbs (this was explained to her) and to avoid sugary drinks like regular sodas and juices.  I also advised her to come into her next appt on April 2nd fasting so she could have a fasting BMET drawn for her glucose (order placed).  She expressed understanding. Initial call taken by: Chauncey Reading DO,  August 30, 2007 5:39 PM  New Problems: HYPERGLYCEMIA (ICD-790.29)   New Problems: HYPERGLYCEMIA (ICD-790.29)

## 2010-07-05 NOTE — Progress Notes (Signed)
Summary: Prior Authorization    Prior authorization approved for Nicotrol Inhaler for thhis patient, however, the insurance company will only pay for a 3 month supply of medication. Notified the patient and the pharmacy...................................................................Marland KitchenConcepcion Elk  April 14, 2007 11:48 AM
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
## 2010-07-05 NOTE — Progress Notes (Signed)
Summary: phone/gg  *  Phone Note Call from Patient   Caller: Patient Summary of Call: Pt called clinic c/o ringing in ears, headache, face flush and nausea.  She was seen in clinic 3/26 for ear pain.  she has f/u on 4/2. we have no appointments until then.  Today bp 145/90. She is concerned because she is not better. Please advise  Pt # [id number] Initial call taken by: Merrie Roof RN,  September 01, 2007 1:46 PM  Follow-up for Phone Call        If she does not feel like she can wait until Thursday, our options are to walk-in, where she will need to wait until there is a no-show or go to urgent care sooner. If she is able to wait, we can see her on the 2nd. If she is unable to keep food down, or if she has severe symptoms, I advise walking in tomorrow.  Follow-up by: Eliseo Gum MD,  September 01, 2007 4:46 PM  Additional Follow-up for Phone Call Additional follow up Details #1::        Pt informed Additional Follow-up by: Merrie Roof RN,  September 01, 2007 4:56 PM

## 2010-07-05 NOTE — Consult Note (Signed)
Summary: [facility]:Dr. Jeral Fruit  [facility]:Dr. Jeral Fruit   Imported By: Florinda Marker 04/14/2007 14:57:52  _____________________________________________________________________  External Attachment:    Type:   Image     Comment:   External Document

## 2010-07-05 NOTE — Progress Notes (Signed)
Summary: Refill/gh  Phone Note Refill Request Message from:  Patient on November 28, 2009 9:39 AM  Refills Requested: Medication #1:  NICOTROL 10 MG INHA Use up to 6 inhalers a day as needed for nicotine craving..  Method Requested: Electronic Initial call taken by: Angelina Ok RN,  November 28, 2009 9:39 AM Caller: Mom  Follow-up for Phone Call        Rx written and given to pt during office visit. Follow-up by: Mariea Stable MD,  November 28, 2009 10:37 AM
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
## 2010-07-05 NOTE — Letter (Signed)
Summary: Handout Printed  Printed Handout:  - *Patient Instructions 

## 2010-07-05 NOTE — Progress Notes (Signed)
Summary: refill/ hla  Phone Note Refill Request Message from:  Fax from Pharmacy on August 11, 2006 4:23 PM  Refills Requested: Medication #1:  NICOTROL 10 MG INHA INHALE ONCE PER HOUR as needed FOR NICOTINE WITHDRAWAL Initial call taken by: Marin Roberts RN,  August 11, 2006 4:23 PM  Follow-up for Phone Call        Refill approved-nurse to complete Follow-up by: Ned Grace MD,  August 11, 2006 5:11 PM  Additional Follow-up for Phone Call Additional follow up Details #1::        Rx faxed to pharmacy Additional Follow-up by: Marin Roberts RN,  August 11, 2006 5:15 PM    Prescriptions: NICOTROL 10 MG INHA (NICOTINE) INHALE ONCE PER HOUR as needed FOR NICOTINE WITHDRAWAL, MAXIMUM 6 CARTRIDGES DAILY  #1 x 2   Entered and Authorized by:   Harriett Sine Phifer MD   Signed by:   Harriett Sine Phifer MD on 08/11/2006   Method used:   Telephoned to ...       [id number]       [address]       [place], [place]  [address]  [place]       Ph: [phone]       Fax: [phone]   RxID:   [id number]
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
## 2010-07-05 NOTE — Miscellaneous (Signed)
Summary: HIPAA Restrictions  HIPAA Restrictions   Imported By: Florinda Marker 06/30/2008 12:22:41  _____________________________________________________________________  External Attachment:    Type:   Image     Comment:   External Document

## 2010-07-05 NOTE — Consult Note (Signed)
Summary: ENT Consult for tinnitus: Dr. Wilkie Aye  Dr. Wilkie Aye   Imported By: Florinda Marker 10/28/2008 14:36:53  _____________________________________________________________________  External Attachment:    Type:   Image     Comment:   External Document
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
## 2010-07-05 NOTE — Letter (Signed)
[facility]   Imported By: Margie Billet 11/22/2009 13:50:26  _____________________________________________________________________  External Attachment:    Type:   Image     Comment:   External Document

## 2010-07-05 NOTE — Assessment & Plan Note (Signed)
Summary: PER DR PHIFER- POSS.STREP THROAT, WILL ONLY LOOK AT THROAT AN...   Vital Signs:  Patient Profile:   55 Years Old Female Height:     67.75 inches (172.09 cm) Weight:      178.3 pounds (81.05 kg) BMI:     27.41 Temp:     97.1 degrees F (36.17 degrees C) oral Pulse rate:   65 / minute BP sitting:   137 / 68  (right arm)  Pt. in pain?   yes    Location:   throat    Intensity:   4    Type:       sore  Vitals Entered By: Chinita Pester RN (July 25, 2008 1:52 PM)              Is Patient Diabetic? No Nutritional Status BMI of 25 - 29 = overweight  Have you ever been in a relationship where you felt threatened, hurt or afraid?No   Does patient need assistance? Functional Status Self care Ambulation Normal     PCP:  Joaquin Courts  MD  Chief Complaint:  Sorethroat.  History of Present Illness: Please note that this visit was just to obtain a Rapid Strep Test. Pt reports strep throat in children and says she has had mild throat irritation over last few days. Pt reports no fevers, lymph node swelling.   Depression History:      The patient denies a depressed mood most of the day and a diminished interest in her usual daily activities.           Current Allergies: SULFA    Risk Factors: Tobacco use:  quit    Year quit:  JAN - 2009 Alcohol use:  no Exercise:  yes    Times per week:  2    Type:  YARD WORK  Colonoscopy History:    Date of Last Colonoscopy:  07/08/2006  Mammogram History:    Date of Last Mammogram:  08/14/2006  PAP Smear History:    Date of Last PAP Smear:  04/21/2008    Physical Exam  General:     NAD Mouth:     No erythema or exudates. No anterior cervical lymphadenopathy.     Impression & Recommendations:  Problem # 1:  SORE THROAT (ICD-462) Very low prob given no lymphadenopathy, no fever, no exudates on exam. Strep test negative.  Orders: Rapid Strep-[facility] (16109)   Complete Medication List: 1)  Protonix 40 Mg Tbec  (Pantoprazole sodium) .... Take 1 tablet by mouth two times a day 2)  Albuterol 90 Mcg/act Aers (Albuterol) .... Inhale 1 puff as needed for shortness of breath and wheezing 3)  Calcium 600/vitamin D 600-400 Mg-unit Chew (Calcium carbonate-vitamin d) .... Take 1 tablet by mouth two times a day 4)  Alprazolam 1 Mg Tabs (Alprazolam) .... Take 1/2  tablet by mouth up to twice a day as needed for anxiety. 5)  Minitran 0.4 Mg/hr Pt24 (Nitroglycerin) .... Take 1 tablet for chest pain may repeat up to 3 time for chest pain prior to go to ed 6)  Cymbalta 30 Mg Cpep (Duloxetine hcl) .... Take 1 tablet by mouth once a day in the morning.

## 2010-07-05 NOTE — Consult Note (Signed)
Summary: [facility]:Dr. Anise [facility]:Dr. Jeral Fruit   Imported By: Florinda Marker 09/24/2007 15:02:31  _____________________________________________________________________  External Attachment:    Type:   Image     Comment:   External Document

## 2010-07-05 NOTE — Progress Notes (Signed)
  Phone Note From Pharmacy   Reason for Call: Medication not on formulary Request: Needs authorization from insurer Action Taken: Getting authorization from insurer Summary of Call: Pt needed prior authorization on Nicotrol Inhaler medication DENIED from insurance company.  Faxed signed form to the insurance company, medication not approved medication approved is Bupropion 150mg  and Nicotrol Nasal Spray. Chantix is covered only after other medication had been tried and failed.

## 2010-07-05 NOTE — Miscellaneous (Signed)
  Clinical Lists Changes Patient admitted for chest pain atypical. Please see discharge summary.  Medications: Changed medication from XANAX XR 0.5 MG  TB24 (ALPRAZOLAM) Take one tablet by mouth once daily. to ALPRAZOLAM 1 MG TABS (ALPRAZOLAM) take 1 tablet by mouth daily. Added new medication of MINITRAN 0.4 MG/HR PT24 (NITROGLYCERIN) take 1 tablet for chest pain may repeat up to 3 time for chest pain prior to go to ED

## 2010-07-05 NOTE — Assessment & Plan Note (Signed)
Summary: Flu vaccine  Flu vaccine done at [facility] on 02/10/2007 Lot # [id number]  Fluvirin 0.5 ml in R Deltoid.Angelina Ok RN  March 08, 2008 3:09 PM

## 2010-07-05 NOTE — Consult Note (Signed)
Summary[facility]  [facility]   Imported By: Florinda Marker 09/03/2007 14:09:04  _____________________________________________________________________  External Attachment:    Type:   Image     Comment:   External Document

## 2010-07-05 NOTE — Progress Notes (Signed)
Summary: Refill/gh  Phone Note Refill Request Message from:  Pharmacy on June 18, 2007 8:36 AM  Pt wants refill on Alprazolam 0.25 mg 1 by mouth twice daily.    Method Requested: Electronic Initial call taken by: Angelina Ok RN,  June 18, 2007 9:10 AM  Follow-up for Phone Call        This patient is on klonapin but not on alprazolam.  Where did the alprazolam come from? Follow-up by: Ulyess Mort MD,  June 18, 2007 11:02 AM  Additional Follow-up for Phone Call Additional follow up Details #1::        [name] insurance would not pay for Klonapin.  Put self back on Alpraozlam.  Alprazolam works better.  Has appointment this afternoon with Dr. Andrey Campanile. Additional Follow-up by: Angelina Ok RN,  June 18, 2007 11:06 AM    Additional Follow-up for Phone Call Additional follow up Details #2::    Let Dr. Andrey Campanile address this at her app't today ..................................................................Marland KitchenUlyess Mort MD  June 18, 2007 11:14 AM   Additional Follow-up for Phone Call Additional follow up Details #3:: Additional Follow-up by: Angelina Ok RN,  June 22, 2007 9:49 AM

## 2010-07-05 NOTE — Assessment & Plan Note (Signed)
Summary: FU VISIT/VS   Vital Signs:  Patient Profile:   55 Years Old Female Height:     67.75 inches (172.09 cm) Weight:      171.4 pounds (77.91 kg) Temp:     97.6 degrees F (36.44 degrees C) oral Pulse rate:   77 / minute BP sitting:   124 / 72  Pt. in pain?   no  Vitals Entered By: Filomena Jungling (November 27, 2006 10:24 AM)              Is Patient Diabetic? No  Have you ever been in a relationship where you felt threatened, hurt or afraid?No   Does patient need assistance? Functional Status Self care Ambulation Normal   PCP:  Allena Katz  Chief Complaint:   and Cold & URI symptoms.  History of Present Illness: Pt is a 55yr old gentleman with h/o COPD, ongoing tobacco abuse, Anxiety and several other probs present siwth cough of 1 wk. It is non productive in nature, no fevers, chills, CP, HA, sinus pain, runny nose. Her voice has been coarse. She has tried Robittusin without relief. She has passed it on to her mother at home. She also c/o itching and noticing bumps in her pubic area for some time. The lesions come and go away on their own. She has not noticed any discharge from it.  She would like a refill on nerve pill, for it helps her in the process of quitting smoking.   Current Allergies (reviewed today): SULFA    Risk Factors: Tobacco use:  current    Year started:  1970    Cigarettes:  Yes -- 1 pack(s) per day Exercise:  no  Colonoscopy History:    Date of Last Colonoscopy:  07/08/2006  Mammogram History:    Date of Last Mammogram:  08/14/2006  PAP Smear History:    Date of Last PAP Smear:  02/25/2006    Physical Exam  General:     alert, well-developed, well-nourished, and well-hydrated.  Coarse cough throughout interview. Head:     normocephalic.   Eyes:     vision grossly intact, pupils equal, pupils round, and pupils reactive to light.   Ears:     R ear normal and L ear normal.  Cerumen present Nose:     Slight bogginess of turbinates Mouth:  edentulous.  No PND Neck:     supple, no thyromegaly, and no cervical lymphadenopathy.   Lungs:     normal respiratory effort, no intercostal retractions, no accessory muscle use, normal breath sounds, no dullness, no crackles, and no wheezes.   Heart:     normal rate, regular rhythm, no murmur, no gallop, and no rub.   Abdomen:     soft, non-tender, and normal bowel sounds.   In pubic area, pt had several lesions in various stages of healing with excoriation marks. She had one 'knot' of around 1 cm in diameter, which was non tender, firm. No inguinal LN palpable.    Impression & Recommendations:  Problem # 1:  Hx of BRONCHITIS, VIRAL, ACUTE (ICD-466.0) This to me appears as most likely viral bronchitis. I am not going to put her on antibiotics for that, will ask her to do warm water gargles, use lozenzes if required. She has not had to use her inhalers more than usual and so not worried about starting her on steroids either. The following medications were removed from the medication list:    Augmentin 875-125 Mg Tabs (Amoxicillin-pot clavulanate) .Marland KitchenMarland KitchenMarland KitchenMarland Kitchen  Take 1 tablet by mouth two times a day for infection x 7 days  Her updated medication list for this problem includes:    Albuterol 90 Mcg/act Aers (Albuterol) ..... Inhale 1 puff as needed for shortness of breath and wheezing    Doxy-caps 100 Mg Caps (Doxycycline hyclate) .[name]... Take 1 tablet by mouth two times a day   Problem # 2:  Hx of SYMP SWELL/MASS/LUMP, LOCALIZED SUPERFICIAL (ICD-782.2) The lesions in her pubic area appear to be as staph skin infections. She helps take care of her mother who is chronically sick and in and out of the hospital and could have been colonized with MRSA. So will go ahead and treat her with Doxycycline for 10 days. It will help with bronchitis if it is bacterial in nature.   Problem # 3:  ANXIETY (ICD-300.00) Will go ahead and refill Xanax. Her updated medication list for this problem includes:     Alprazolam 0.25 Mg Tabs (Alprazolam) .[name]... Take 1 tablet by mouth two times a day as required   Medications Added to Medication List This Visit: 1)  Doxy-caps 100 Mg Caps (Doxycycline hyclate) .... Take 1 tablet by mouth two times a day   Patient Instructions: 1)  Do salt water gargles. 2)  Use lozenges over the counter as required. 3)  Congrats on cutting down your smoking, I am very proud of you for that.    Prescriptions: ALPRAZOLAM 0.25 MG TABS (ALPRAZOLAM) Take 1 tablet by mouth two times a day as required  #40 x 1   Entered and Authorized by:   Artist Beach MD   Signed by:   Artist Beach MD on 11/27/2006   Method used:   Print then Give to Patient   RxID:   [id number]
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
## 2010-07-05 NOTE — Progress Notes (Signed)
  Phone Note Outgoing Call   Call placed by: Joaquin Courts  MD,  Oct 09, 2007 2:56 PM Summary of Call: Spoke w/ pt about blood sugar results and emphasized the importance of weight loss, diet, and exercise b/c while she does not meet the criteria for diabetes, her fasting blood surgar is elevated.  Will cont to monitor periodically.  Also spoke w/ pt about about u/s of the thigh and she was very concerned about cancer b/c the radiologist tech asked her if she had ever had cancer.  Discussed the results w/ her and will check a f/u u/s of these areas again in 07/09 b/c these are likely lipomas.

## 2010-07-05 NOTE — Assessment & Plan Note (Signed)
Summary: earache for a week per Michele [name][mkj]   Vital Signs:  Patient Profile:   55 Years Old Female Height:     67.75 inches (172.09 cm) Weight:      182.0 pounds (82.73 kg) BMI:     27.98 Temp:     97.0 degrees F (36.11 degrees C) oral Pulse rate:   80 / minute BP sitting:   134 / 88  (right arm)  Pt. in pain?   no  Vitals Entered By: Krystal Eaton Duncan Dull) (August 27, 2007 11:10 AM)              Is Patient Diabetic? No  Have you ever been in a relationship where you felt threatened, hurt or afraid?No   Does patient need assistance? Functional Status Self care Ambulation Normal     PCP:  Joaquin Courts  MD  Chief Complaint:  pt here for "earache and HA"-was also c/o elevated blood sugars and wants to be checked for diabetes.  History of Present Illness: Ms. Newhouse is a 55 yo lady with a PMH as outlined in the EMR. She came today for some pressure in her head for 3 days. No actual pain. NO vision or hearing problem. She says something is roaring close to her ear and head. NO discharge. No sore throat, fever, chills, sinus pressure. No problem with your balance. No weakness or new onset numbness. No vertigo. She does have some numbness in her legs for many years.   She went to urgent care 4 days ago as she had backpain. She was treated with flexeril, Loratab and prednisone in a tapering dose. She didn't take flexeril but took some prednisone loratab. She stopped taking prednisone as it was making her "anxious" and "too hyper".   She was also found to have glucose in urine in Urgent care. Her mom have DM. She is concerned if she has DM.     Current Allergies (reviewed today): SULFA    Risk Factors:  Tobacco use:  current    Year started:  1970    Cigarettes:  Yes -- 2 pack(s) per day    Counseled to quit/cut down tobacco use:  yes Alcohol use:  no Exercise:  yes    Times per week:  2    Type:  YARD WORK  Colonoscopy History:    Date of Last Colonoscopy:   07/08/2006  Mammogram History:    Date of Last Mammogram:  08/14/2006  PAP Smear History:    Date of Last PAP Smear:  03/31/2007   Review of Systems  General      Denies chills and fever.  ENT      Complains of earache and ringing in ears.      Denies decreased hearing, difficulty swallowing, ear discharge, hoarseness, nasal congestion, nosebleeds, postnasal drainage, sinus pressure, and sore throat.  CV      Denies chest pain or discomfort, shortness of breath with exertion, and swelling of feet.  Resp      Denies cough.  Neuro      Complains of headaches.      Denies brief paralysis, disturbances in coordination, seizures, tremors, visual disturbances, and weakness.   Physical Exam  General:     alert.   Ears:     L ear normal. Very minimal erythema on the rt. external meatus. Sinuses nontender.  Nose:     no external erythema and no nasal discharge.   Mouth:     pharynx pink and moist.  Neck:     supple and no masses.   Lungs:     normal breath sounds, no crackles, and no wheezes.   Heart:     normal rate, regular rhythm, no murmur, and no gallop.   Abdomen:     soft and non-tender.   Extremities:     No pitting pedal edema.  Neurologic:     alert & oriented X3, cranial nerves II-XII intact, strength normal in all extremities, sensation intact to light touch, gait normal, DTRs symmetrical and normal, finger-to-nose normal, and toes down bilaterally on Babinski.      Impression & Recommendations:  Problem # 1:  TINNITUS (ICD-388.30) See HPI and PE. I think this is because of tiny inflammaiton on her rt. external meatus. Neuro exam completely normal. I doubt this is malignant otitis externa. I will treat this with Cipro drops and tylenol as needed for pain. I will see her in 2 wks and if the symptom is persistent or worse she needs further workup.   Problem # 2:  POLYURIA (NWG-956.21) Will check A1c for the glucose control in last 3 months, although I am  aware this is not a method of diagnosis for DM. She had normal fasting glucose on 10/08. She was found to have glucose in urine twice and her mom has DM and she is concerned for this.   Complete Medication List: 1)  Prevacid 30 Mg Cpdr (Lansoprazole) .... Take 1 tablet by mouth once a day 2)  Albuterol 90 Mcg/act Aers (Albuterol) .... Inhale 1 puff as needed for shortness of breath and wheezing 3)  Oystercal 500 + D 500-125 Mg-unit Tabs (Calcium carbonate-vitamin d) .... Take 1 tablet by mouth three times a day 4)  Nicotrol 10 Mg Inha (Nicotine) 5)  Alprazolam 0.25 Mg Tabs (Alprazolam) .... One tablet by  mouth once daily. 6)  Ciprofloxacin Hcl 0.3 % Soln (Ciprofloxacin hcl) .... Put 2 drops three times daily on both ears.  Other Orders: T-Hgb A1C (in-house) ([id number])   Patient Instructions: 1)  F/u on your next appt. If your symptom is worse, come to the clinic earlier.  2)  Limit your Sodium (Salt). 3)  Limit your Sodium (Salt) to less than 2 grams a day(slightly less than 1/2 a teaspoon) to prevent fluid retention, swelling, or worsening of symptoms. 4)  Tobacco is very bad for your health and your loved ones! You Should stop smoking!. 5)  Stop Smoking Tips: Choose a Quit date. Cut down before the Quit date. decide what you will do as a substitute when you feel the urge to smoke(gum,toothpick,exercise). 6)  It is important that you exercise regularly at least 20 minutes 5 times a week. If you develop chest pain, have severe difficulty breathing, or feel very tired , stop exercising immediately and seek medical attention. 7)  You need to lose weight. Consider a lower calorie diet and regular exercise.     Prescriptions: CIPROFLOXACIN HCL 0.3 %  SOLN (CIPROFLOXACIN HCL) Put 2 drops three times daily on both ears.  #1 mo supply x 0   Entered and Authorized by:   Jason Coop MD   Signed by:   Jason Coop MD on 08/27/2007   Method used:   Print then Give to Patient   RxID:    [id number]  ]  Appended Document: results of HGB A1C    Lab Visit   Laboratory Results   Blood Tests   Date/Time Received: August 27, 2007 12:23 PM  Date/Time  Reported: ..................................................................Marland KitchenOren Beckmann  August 27, 2007 12:23 PM   HGBA1C: 6.5%   (Normal Range: Non-Diabetic - 3-6%   Control Diabetic - 6-8%)     Orders Today:
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
## 2010-07-05 NOTE — Progress Notes (Signed)
Summary: chest discomfort/ hla  Phone Note Call from Patient   Summary of Call: pt calls to report chest discomfort at appr 0230 this am, she wants a [facility] referral, explained that she must be evaluated here first, appt made for 06/05/08, dr Clent Ridges. pt refuses er at this time, she is instructed that if pain occurs again she is to immediately have someone drive her or call [phone] for er, she verb understanding. Initial call taken by: Marin Roberts RN,  May 31, 2009 2:14 PM  Follow-up for Phone Call        Thank you. Follow-up by: Joaquin Courts  MD,  June 01, 2009 10:20 AM

## 2010-07-05 NOTE — Assessment & Plan Note (Signed)
Summary: RA/NEEDS ROUTINE CHECKUP/CH   Vital Signs:  Patient profile:   55 year old female Height:      67.75 inches (172.09 cm) Weight:      152.1 pounds (69.14 kg) BMI:     23.38 Temp:     98.6 degrees F (37.00 degrees C) oral Pulse rate:   80 / minute BP sitting:   128 / 73  (left arm)  Vitals Entered By: Cynda Familia Duncan Dull) (May 14, 2010 8:52 AM) CC: pt states she feels "weak" all the time, sores on right leg-started back in the summer, itches, tingling/numbess in legs/toes, occ epsiodes where it feels like her heart is skipping a beat and she gets dizzy, epigastric pain Is Patient Diabetic? No Pain Assessment Patient in pain? yes     Location: legs Intensity: 2 Type: tingling Onset of pain  Chronic Nutritional Status BMI of 25 - 29 = overweight  Have you ever been in a relationship where you felt threatened, hurt or afraid?No   Does patient need assistance? Functional Status Self care Ambulation Normal   Primary Care Provider:  Rosana Berger MD  CC:  pt states she feels "weak" all the time, sores on right leg-started back in the summer, itches, tingling/numbess in legs/toes, occ epsiodes where it feels like her heart is skipping a beat and she gets dizzy, and epigastric pain.  History of Present Illness: 55 yo woman with PMH of myalgia, depression, anxiety, atypical chest pain presents today for   1. heart skipping , feel dizzy, then it goes away: symptoms come once per month.  She saw cardiologist several times but negative. 2. rash on right leg- since summer and was seen in by urgent and was given cream and antibiotics.  It itches, no drainage. 3. Leg numbness- numbness for years but no one can figure out why. Negative doppler of lower extremities 4. Depression/Anxiety- she is on Xanax and only take it when she needs it.  She was seen by [facility] and per patient, psychiatrist does not think she has depression and she refuses to take  antidepressant.   Depression History:      The patient denies a depressed mood most of the day and a diminished interest in her usual daily activities.         Allergies: 1)  ! Aspirin 2)  Sulfa  Review of Systems  The patient denies anorexia, fever, weight loss, weight gain, vision loss, decreased hearing, hoarseness, chest pain, syncope, dyspnea on exertion, peripheral edema, prolonged cough, headaches, hemoptysis, abdominal pain, melena, hematochezia, severe indigestion/heartburn, hematuria, incontinence, genital sores, muscle weakness, suspicious skin lesions, transient blindness, difficulty walking, depression, unusual weight change, abnormal bleeding, enlarged lymph nodes, angioedema, breast masses, and testicular masses.    Physical Exam  General:  alert, well-developed, well-nourished, and well-hydrated.   Lungs:  normal respiratory effort, no intercostal retractions, no accessory muscle use, normal breath sounds, no crackles, and no wheezes.   Heart:  normal rate, regular rhythm, no murmur, no gallop, no rub, and no JVD.   Pulses:  R and L carotid,radial,femoral,dorsalis pedis and posterior tibial pulses are full and equal bilaterally Extremities:  No clubbing, cyanosis, edema, or deformity noted with normal full range of motion of all joints.   Neurologic:  No cranial nerve deficits noted. Station and gait are normal. Plantar reflexes are down-going bilaterally. DTRs are symmetrical throughout. Sensory, motor and coordinative functions appear intact. Skin:  Right anterior leg: erythematous papules with excoriation marks, crustings.  No  bleeding or drainage. 2x2cm induration of hyperpigmented skin on right anterior leg near knee joint. Right thigh: 1.5 x 1.5 cm lipoma Left arm: 1 x .5cm lipoma Psych:  moderately anxious.  denies any SI/HI   Impression & Recommendations:  Problem # 1:  CONTACT DERMATITIS&OTHER ECZEMA DUE UNSPEC CAUSE (ICD-692.9) Skin rash: dermatitis-appears  to be Eczema.   Will treat with high potency corticosteroid-Topicort 0.25% cream apply to right leg two times a day  Patient is very concerned with the rough indurated area that is 2x2cm and would like it to be removed.  I explained to her that we can try a cream that would help soften the skin so that she does not have to have surgery.  If not resolved in 4 weeks, will refer to dermatologist if she wants.  She actually has her own dermatologist and said she will follow up with him if the rash does not go away  Her updated medication list for this problem includes:    Topicort 0.25 % Crea (Desoximetasone) .[name]... Apply to affected area two times a day, do not apply on face, axillae, groin, intertriginous areas  Problem # 2:  PALPITATIONS (ICD-785.1) Intermittent.  She is currently asymptomatic. Will watch for now and will refer to cardiologist for event monitor if she becomes symptomatic more often.  Problem # 3:  VITAMIN D DEFICIENCY (ICD-268.9) Vit D deficiency- has been taking Ca-Vit D 1000, Last Vit D 1,25 OH was 06/2008 showed 23.  Will check Vit D level today.  If her Vit D level is low, it could be the contributing factor to her weakness and leg numbness.   Vit B 12 was WNL   Orders: T-Vitamin D 25-Hydroxy & 1,25 Dihydroxy (8147)  Complete Medication List: 1)  Albuterol 90 Mcg/act Aers (Albuterol) .... Inhale 1 puff as needed for shortness of breath and wheezing 2)  Calcium 600mg  + Vitamin D 1000  .... Take 1 tablet by mouth two times a day 3)  Alprazolam 1 Mg Tabs (Alprazolam) .... Take 1 tablet by mouth three times a day. 4)  Nicotrol Ns 10 Mg/ml Soln (Nicotine) .... Apply 1-2 sprays in each nostril every 1 hour x 8 weeks then taper. 5)  Topicort 0.25 % Crea (Desoximetasone) .... Apply to affected area two times a day, do not apply on face, axillae, groin, intertriginous areas  Patient Instructions: 1)  Please use Topicort on rash two times a day, do not apply to face, axillae,  groin, or intertriginous areas 2)  Will get Vit D level today and I will call you with lab results 3)  Will consider refer to cardiologist if your palpitation persists or come more frequently 4)  Follow up in 3-4 weeks  Prescriptions: TOPICORT 0.25 % CREA (DESOXIMETASONE) apply to affected area two times a day, do not apply on face, axillae, groin, intertriginous areas  #1 x 0   Entered and Authorized by:   Rosana Berger MD   Signed by:   Rosana Berger MD on 05/14/2010   Method used:   Print then Give to Patient   RxID:   [id number] ALPRAZOLAM 1 MG TABS (ALPRAZOLAM) Take 1 tablet by mouth three times a day.  #90 x 3   Entered and Authorized by:   Rosana Berger MD   Signed by:   Rosana Berger MD on 05/14/2010   Method used:   Print then Give to Patient   RxID:   [id number]    Orders Added: 1)  T-Vitamin D  25-Hydroxy & 1,25 Dihydroxy [8147]   Immunization History:  Influenza Immunization History:    Influenza:  historical (04/01/2010)   Immunization History:  Influenza Immunization History:    Influenza:  Historical (04/01/2010) Process Orders Check Orders Results:     [facility]: Check successful Tests Sent for requisitioning (May 14, 2010 4:32 PM):     05/14/2010: [facility] -- T-Vitamin D 25-Hydroxy & 1,25 Dihydroxy [8147] (signed)     Prevention & Chronic Care Immunizations   Influenza vaccine: Historical  (04/01/2010)   Influenza vaccine deferral: Deferred  (11/28/2008)   Influenza vaccine due: 02/01/2009    Tetanus booster: Not documented   Td booster deferral: Deferred  (11/28/2009)    Pneumococcal vaccine: Not documented  Colorectal Screening   Hemoccult: Not documented   Hemoccult action/deferral: Refused  (05/14/2010)    Colonoscopy: polyp  Hyperplastic polyp     (07/08/2006)   Colonoscopy due: 07/2011  Other Screening   Pap smear: NEGATIVE FOR INTRAEPITHELIAL LESIONS OR MALIGNANCY.  (06/28/2009)   Pap  smear action/deferral: Ordered  (06/28/2009)   Pap smear due: 03/2007    Mammogram: mammograms for comparison - BI-RADS 0^MM DIGITAL SCREENING  (10/27/2009)   Mammogram due: 08/2007   Smoking status: current  (11/28/2009)   Smoking cessation counseling: yes  (11/28/2009)  Lipids   Total Cholesterol: 184  (06/28/2009)   LDL: 102  (06/28/2009)   LDL Direct: Not documented   HDL: 33  (06/28/2009)   Triglycerides: 245  (06/28/2009)    SGOT (AST): 14  (10/27/2009)   SGPT (ALT): 11  (10/27/2009)   Alkaline phosphatase: 69  (10/27/2009)   Total bilirubin: 0.4  (10/27/2009)  Self-Management Support :   Personal Goals (by the next clinic visit) :      Personal LDL goal: 100  (10/27/2009)    Patient will work on the following items until the next clinic visit to reach self-care goals:     Medications and monitoring: take my medicines every day  (05/14/2010)     Eating: eat foods that are low in salt, eat baked foods instead of fried foods  (05/14/2010)     Activity: park at the far end of the parking lot  (11/28/2009)    Lipid self-management support: Written self-care plan, Education handout, Resources for patients handout  (11/28/2009)

## 2010-07-05 NOTE — Assessment & Plan Note (Signed)
Summary: er /fu [mkj]   Vital Signs:  Patient profile:   55 year old female Height:      67.75 inches (172.09 cm) Weight:      170.4 pounds (77.45 kg) BMI:     26.20 Temp:     98.2 degrees F (36.78 degrees C) oral Pulse rate:   89 / minute BP sitting:   110 / 70  (right arm)  Vitals Entered By: Chinita Pester RN (November 02, 2008 9:51 AM)  CC: c/o dizziness, h/a's, weakness x3 weeks; she has been to ED, urgent care and here at the clinic for the same symptoms.  ? shingles  lower back, Pain Assessment Patient in pain? no      Nutritional Status BMI of 25 - 29 = overweight  Have you ever been in a relationship where you felt threatened, hurt or afraid?No   Does patient need assistance? Functional Status Self care Ambulation Normal   Primary Care Provider:  Joaquin Courts  MD  CC:  c/o dizziness, h/a's, weakness x3 weeks; she has been to ED, urgent care and here at the clinic for the same symptoms.  ? shingles  lower back, and .  History of Present Illness: Pt is a 55yo CF with pmhx outlined below who presents today with complaint of weakness.  Pt has visited ED, clinic, and urgent care over the week with a variety of complaints, namely weakness.  She has had a long battery of laboratory work done, with no objective explanation of her weakness.  She has a diagnosis of depression, but has never been treated medically.  On questioning, she reports problems with sleep, decreased interest in daily activities, fatigue, problems concentrating, decreased appetite, and anxiety.  Denies SI/HI. At this present time, pt denies headache and dizziness.    Depression History:      The patient denies a depressed mood most of the day and a diminished interest in her usual daily activities.         Preventive Screening-Counseling & Management  Alcohol-Tobacco     Alcohol drinks/day: 0     Smoking Status: quit     Smoking Cessation Counseling: yes     Packs/Day: 2     Year Started: 1970  Year Quit: JAN - 2009  Caffeine-Diet-Exercise     Does Patient Exercise: yes     Type of exercise: YARD WORK     Times/week: 2  Allergies: 1)  Sulfa  Review of Systems       See HPI  Physical Exam  General:  normal appearance.   Eyes:  vision grossly intact.   Mouth:  pharynx pink and moist, no erythema, and no exudates.   Lungs:  Normal respiratory effort, chest expands symmetrically. Lungs are clear to auscultation, no crackles or wheezes. Heart:  Normal rate and regular rhythm. S1 and S2 normal without gallop, murmur, click, rub or other extra sounds. Abdomen:  soft.   Neurologic:  Nonfocal Skin:  1cm erythematous papule on lower back, non-draining, non-ttp,  Psych:  Oriented X3, slightly anxious, and easily distracted.     Impression & Recommendations:  Problem # 1:  DEPRESSION (ICD-311) Plan to treat [name] with an SSRI at this point.  She has many vague, ongoing complaints, with a negative work-up thus far.  I believe much of her fatigue and weakness could be explained by her untreated depression.  Will start Prozac 20mg  and have her f/u in 4 weeks to assess efficacy.  No  SI/HI at this time.    Her updated medication list for this problem includes:    Alprazolam 1 Mg Tabs (Alprazolam) .[name]... Take 1/2  tablet by mouth up to twice a day as needed for anxiety.    Prozac 20 Mg Caps (Fluoxetine hcl) .[name]... Take one tab by mouth each morning  Complete Medication List: 1)  Prevacid 30 Mg Cpdr (Lansoprazole) .... Take 1 tablet by mouth once a day 2)  Albuterol 90 Mcg/act Aers (Albuterol) .... Inhale 1 puff as needed for shortness of breath and wheezing 3)  Calcium 600/vitamin D 600-400 Mg-unit Chew (Calcium carbonate-vitamin d) .... Take 1 tablet by mouth two times a day 4)  Alprazolam 1 Mg Tabs (Alprazolam) .... Take 1/2  tablet by mouth up to twice a day as needed for anxiety. 5)  Meclizine Hcl 25 Mg Tabs (Meclizine hcl) .[name].. 1 by mouth two times a day as needed  vertigo 6)  Prozac 20 Mg Caps (Fluoxetine hcl) .... Take one tab by mouth each morning  Patient Instructions: 1)  Please schedule a follow-up appointment in 1 month. Prescriptions: PROZAC 20 MG CAPS (FLUOXETINE HCL) Take one tab by mouth each morning  #30 x 3   Entered and Authorized by:   Michele Del MD   Signed by:   Michele Del MD on 11/02/2008   Method used:   Print then Give to Patient   RxID:   [id number] PROZAC 20 MG CAPS (FLUOXETINE HCL) Take one tab by mouth each morning  #30 x 3   Entered and Authorized by:   Michele Del MD   Signed by:   Michele Del MD on 11/02/2008   Method used:   Print then Give to Patient   RxID:   [id number]

## 2010-07-05 NOTE — Consult Note (Signed)
Summary: Neurosurgery-Dr. Jeral Fruit  Neurosurgery-Dr. Jeral Fruit   Imported By: Dorice Lamas 09/25/2006 11:00:14  _____________________________________________________________________  External Attachment:    Type:   Image     Comment:   External Document
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
## 2010-07-05 NOTE — Medication Information (Signed)
Summary: [facility]: Flu Shot  [facility]: Flu Shot   Imported By: Florinda Marker 03/29/2009 14:33:57  _____________________________________________________________________  External Attachment:    Type:   Image     Comment:   External Document
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
## 2010-07-05 NOTE — Progress Notes (Signed)
Summary: refill/ hla  Phone Note Refill Request Message from:  Patient on March 12, 2010 9:06 AM  Refills Requested: Medication #1:  ALPRAZOLAM 1 MG TABS Take 1 tablet by mouth three times a day.   Dosage confirmed as above?Dosage Confirmed   Last Refilled: 9/3 last visit 6/28  Initial call taken by: Marin Roberts RN,  March 12, 2010 9:06 AM  Additional Follow-up for Phone Call Additional follow up Details #1::        Rx called to pharmacy Additional Follow-up by: Merrie Roof RN,  March 14, 2010 10:13 AM    Prescriptions: ALPRAZOLAM 1 MG TABS (ALPRAZOLAM) Take 1 tablet by mouth three times a day.  #90 x 0   Entered and Authorized by:   Zoila Shutter MD   Signed by:   Zoila Shutter MD on 03/12/2010   Method used:   Telephoned to ...       [facility] [address] [id number] (retail)       [address] [place], [place]  [address]       Ph: [phone]       Fax: [phone]   RxID:   [id number]

## 2010-07-05 NOTE — Medication Information (Signed)
Summary: [facility] RX  [facility] RX   Imported By: Shon Hough 12/22/2009 15:11:07  _____________________________________________________________________  External Attachment:    Type:   Image     Comment:   External Document

## 2010-07-05 NOTE — Assessment & Plan Note (Signed)
Summary: EST-CK.FU/MEDS/CFB   Vital Signs:  Patient Profile:   55 Years Old Female Weight:      175.6 pounds (79.82 kg) Temp:     97.0 degrees F (36.11 degrees C) oral Pulse rate:   63 / minute BP sitting:   135 / 75  (right arm)  Pt. in pain?   no  Vitals Entered By: Filomena Jungling (August 04, 2006 11:46 AM)              Is Patient Diabetic? No Nutritional Status Normal  Does patient need assistance? Functional Status Self care Ambulation Normal   PCP:  Allena Katz  Chief Complaint:  chest pain, 2weeks ago, and better now.  History of Present Illness: Pt is a 55yr old lady with h/o GERD, OA, remote Seizure presents today with c/o chest pain 2 wks ago. It is no more present today. She is seen by Dr. Christella Hartigan for her GI complaints and had EGD recently, the rpts of which are not available at present. She was told at the end of the procedure that she has Hiatal hernia, which she knew before. She also has had a colonoscopy which was normal except a polyp, the pathology did not show any malignancy. She is currently on Prevacid which is helping with GI symptoms. She quit smoking 10days ago, but now is getting very anxious and edgy. She is not able to sleep well at night. She tried Nicotrol inhaler, but now has run out of it and since Insuranc ewon't pay for it, cannot afford it. She does not want to try Chantix as that gives her stomach probs. She was asking about Bupropion, but with her Seizure history, I am not willing to prescribe it to her. Her back pain is still persistent, with leg pain and tingling and numbness, no incontinence. She recently had MRI of her spine which showed extensive degenerative changes with likely C7 nerve root compression.  Prior Medications (reviewed today): PREVACID 30 MG CPDR (LANSOPRAZOLE) Take 1 tablet by mouth once a day SPIRIVA HANDIHALER 18 MCG CAPS (TIOTROPIUM BROMIDE MONOHYDRATE) Inhale the contents of 1 capsule once daily ALBUTEROL 90 MCG/ACT AERS  (ALBUTEROL) Inhale 1 puff as needed for shortness of breath and wheezing OYSTERCAL 500 + D 500-125 MG-UNIT TABS (CALCIUM CARBONATE-VITAMIN D) Take 1 tablet by mouth three times a day NICOTROL 10 MG INHA (NICOTINE) INHALE ONCE PER HOUR as needed FOR NICOTINE WITHDRAWAL, MAXIMUM 6 CARTRIDGES DAILY ALPRAZOLAM 0.25 MG TABS (ALPRAZOLAM) Take 1 tablet by mouth two times a day as required Current Allergies (reviewed today): SULFA    Risk Factors:  Tobacco use:  quit    Year quit:  2-08  Mammogram History:     Date of Last Mammogram:  08/14/2006    Results:  No specific mammographic evidence of malignancy    Colonoscopy History:     Date of Last Colonoscopy:  07/08/2006    Results:  polyp  Hyperplastic polyp      PAP Smear History:     Date of Last PAP Smear:  02/25/2006    Results:  Negative for intraepithelial Lesion or Malignancy       Physical Exam  General:     alert, well-nourished, and overweight-appearing.   Head:     normocephalic.   Neck:     supple and no masses.   Lungs:     normal respiratory effort, normal breath sounds, no crackles, and no wheezes.   Heart:     normal rate, regular  rhythm, no murmur, no gallop, and no rub.   Abdomen:     soft, non-tender, and normal bowel sounds.   Msk:     normal ROM at all joints including back. Extremities:     No edema. Neurologic:     Non-focal    Impression & Recommendations:  Problem # 1:  LOW BACK PAIN (ICD-724.2) MRI showed extensive deg changes in her Cervical spine with mild disc  bulge from C2-C7,  Osteophyte formation at C4-C5 and C5-C6, but only nerve root compression at C7. Also Lumbar spine showed deg but no spinal stenosis, foramina encroachment or nerve root compressions. I discussed the options for treatment like PT referral, steroid injection or Neurosurgeon referral. She would like to try the latter. So will do that. The following medications were removed from the medication list:    Darvocet-n 100  Tabs (Propoxyphene n-apap tabs) .[name]... Take 1 tablet by mouth as directed for pain    Aspir-low 81 Mg Tbec (Aspirin) .[name]... Take 1 tablet by mouth once a day  Orders: Neurosurgeon Referral (Neurosurgeon)   Problem # 2:  GERD (ICD-530.81) Have requested a copy of EGD report from Dr. Christella Hartigan. Continue Prevacid as prescribed. Her updated medication list for this problem includes:    Prevacid 30 Mg Cpdr (Lansoprazole) .[name]... Take 1 tablet by mouth once a day   Problem # 3:  TOBACCO ABUSE (ICD-305.1) I congratulated her on quitting and discussed options to help with anxiety. She would like to continue with Nicotrol inhaler so will give her a script to her. If her insurance sends me papers interrogating the need, I shall override it. Also for this acute times, will give her a script for Xanax. Her updated medication list for this problem includes:    Nicotrol 10 Mg Inha (Nicotine) ..... Inhale once per hour as needed for nicotine withdrawal, maximum 6 cartridges daily   Medications Added to Medication List This Visit: 1)  Alprazolam 0.25 Mg Tabs (Alprazolam) .... Take 1 tablet by mouth two times a day as required   Patient Instructions: 1)  I am so glad that you have quit. Keep up the good effor tgoing. If you need any help with talking or meds, please let us know. Rest assured of my full support through this. 2)  I will check the EGD report, and keep the apt with Dr. Christella Hartigan on 08/16/06. 3)  Please schedule a follow-up appointment in 1 month.   Mammogram  Procedure date:  08/14/2006  Findings:      No specific mammographic evidence of malignancy    Pap Smear  Procedure date:  02/25/2006  Findings:      Negative for intraepithelial Lesion or Malignancy    Colonoscopy  Procedure date:  07/08/2006  Findings:      polyp  Hyperplastic polyp     Procedures Next Due Date:    Mammogram: 08/2007    Pap Smear: 03/2007    Colonoscopy: 07/2011  Prescriptions: NICOTROL 10 MG INHA  (NICOTINE) INHALE ONCE PER HOUR as needed FOR NICOTINE WITHDRAWAL, MAXIMUM 6 CARTRIDGES DAILY  #1 x 2   Entered and Authorized by:   Artist Beach MD   Signed by:   Artist Beach MD on 08/04/2006   Method used:   Print then Give to Patient   RxID:   [id number] ALPRAZOLAM 0.25 MG TABS (ALPRAZOLAM) Take 1 tablet by mouth two times a day as required  #40 x 1   Entered and Authorized by:   Tarri Fuller  Allena Katz MD   Signed by:   Artist Beach MD on 08/04/2006   Method used:   Print then Give to Patient   RxID:   [id number]    Appended Document: EST-CK.FU/MEDS/CFB Inocencio Homes in triage informed me that pt called saying that Nicotrol Nasal spray will be covered by her insurance and so will like a script for that. I will go ahead and change the med list and write a script.   Clinical Lists Changes  Medications: Changed medication from NICOTROL 10 MG INHA (NICOTINE) INHALE ONCE PER HOUR as needed FOR NICOTINE WITHDRAWAL, MAXIMUM 6 CARTRIDGES DAILY to NICOTROL NS 10 MG/ML SOLN (NICOTINE) Use 1-2 sprays per hour. - Signed Rx of NICOTROL NS 10 MG/ML SOLN (NICOTINE) Use 1-2 sprays per hour.;  #1 x 2;  Signed;  Entered by: Artist Beach MD;  Authorized by: Artist Beach MD;  Method used: Print then Give to Patient

## 2010-07-05 NOTE — Miscellaneous (Signed)
Summary: LIPIDS  Clinical Lists Changes  Observations: Added new observation of LDL: 125  --  06/01/2008 (98/04/9146 14:01)

## 2010-07-05 NOTE — Assessment & Plan Note (Signed)
Summary: Pt arrived, but not seen//kg   Allergies: 1)  ! Aspirin 2)  Sulfa   Complete Medication List: 1)  Albuterol 90 Mcg/act Aers (Albuterol) .... Inhale 1 puff as needed for shortness of breath and wheezing 2)  Calcium 600mg  + Vitamin D 1000  .... Take 1 tablet by mouth two times a day 3)  Alprazolam 1 Mg Tabs (Alprazolam) .... Take 1 tablet by mouth three times a day. 4)  Nicotrol Ns 10 Mg/ml Soln (Nicotine) .... Apply 1-2 sprays in each nostril every 1 hour x 8 weeks then taper.  Other Orders: No Charge Patient Arrived (NCPA0) (NCPA0) Prescriptions: ALPRAZOLAM 1 MG TABS (ALPRAZOLAM) Take 1 tablet by mouth three times a day.  #90 x 0   Entered and Authorized by:   Rosana Berger MD   Signed by:   Rosana Berger MD on 04/17/2010   Method used:   Telephoned to ...       [facility] [id number]* (retail)       [address] [place], [place]  [address]       Ph: [phone]       Fax: [phone]   RxID:   [id number]  Rx called into above pharmacy.  Pt was here for a visit, but unable to wait any longer, appt was r/s to Dec 12th @ 9am with Dr Anselm Jungling.[facility])  April 18, 2010 8:59 AM   Orders Added: 1)  No Charge Patient Arrived (NCPA0) [NCPA0]

## 2010-07-05 NOTE — Progress Notes (Signed)
Summary: Prior Authorization on Nictrol inhaler-Denied  Phone Note Outgoing Call   Call placed by: Angelina Ok RN,  December 01, 2009 4:43 PM Call placed to: Insurer Summary of Call: Call to [place] at [phone].  Form for Prior Authorization on the Nicotine inhaler to be faxe for Dr. Onalee Hua to complet for Prior Authorization on the Nicotol Inhaler. Reference number is [id number]. Angelina Ok RN  December 01, 2009 4:46 PM  Initial call taken by: Angelina Ok RN,  December 01, 2009 4:46 PM  Follow-up for Phone Call        I will be in clinic tomorrow (7/6).  I can fill it out then if we have recieved it.  thanks, Follow-up by: Mariea Stable MD,  December 05, 2009 7:54 AM

## 2010-07-05 NOTE — Progress Notes (Signed)
Summary: meds/gg  Phone Note Call from Patient   Summary of Call: Pt called c/o yeast infection after taking antibiotics.  Please call into [facility]. Dr Sherlon Handing informed Initial call taken by: Merrie Roof RN,  November 05, 2006 2:56 PM  Follow-up for Phone Call        Rx Called In Follow-up by: Coralie Carpen MD,  November 05, 2006 2:57 PM  New/Updated Medications: DIFLUCAN 150 MG TABS (FLUCONAZOLE) Take one tablet for infection.  New/Updated Medications: DIFLUCAN 150 MG TABS (FLUCONAZOLE) Take one tablet for infection.  Prescriptions: DIFLUCAN 150 MG TABS (FLUCONAZOLE) Take one tablet for infection.  #1 x 0   Entered and Authorized by:   Coralie Carpen MD   Signed by:   Coralie Carpen MD on 11/05/2006   Method used:   Telephoned to ...         RxID:   [id number]
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
## 2010-07-05 NOTE — Progress Notes (Signed)
Summary: chest pressure/ hla  Phone Note Call from Patient   Summary of Call: pt calls to state for 2-3 days she has had "pressure around her heart", like her chest is squeezing and groin pain. she is asked to go to the er now, pt states she can't until in the am because she is watching her mother. pt is asked to keep the ph within reach and to inform a friend or family member of her problem so they can check on her. she states she will do so. Initial call taken by: Marin Roberts RN,  May 31, 2008 4:54 PM
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
## 2010-07-05 NOTE — Progress Notes (Signed)
Summary: Refill/gh  Phone Note Refill Request Message from:  Pharmacy on September 24, 2007 12:15 PM  Refills Requested: Medication #1:  NICOTROL 10 MG  INHA    New/Updated Medications: NICOTROL 10 MG  INHA (NICOTINE) Use up to 15 cartridges a day as needed to keep from smoking.   Prescriptions: NICOTROL 10 MG  INHA (NICOTINE) Use up to 15 cartridges a day as needed to keep from smoking.  #1 mos. x 3   Entered and Authorized by:   Joaquin Courts  MD   Signed by:   Joaquin Courts  MD on 09/24/2007   Method used:   Electronically sent to ...       [id number]*       [address]       [place], [place]  [address]       Ph: [phone]       Fax: [phone]   RxID:   [id number]

## 2010-07-05 NOTE — Letter (Signed)
Summary: [facility]: RX  [facility]: RX   Imported By: Shon Hough 12/22/2009 15:09:36  _____________________________________________________________________  External Attachment:    Type:   Image     Comment:   External Document

## 2010-07-05 NOTE — Progress Notes (Signed)
Summary: med refill-nicotrol inh  Phone Note Refill Request Message from:  Fax from Pharmacy on June 17, 2006 11:09 AM  Refills Requested: Medication #1:  nicotrol inh 10mg , use 6 cart. q day for 6 weeks   Dosage confirmed as above?Dosage Confirmed   Last Refilled: 12/17/2005  Method Requested: Fax to Local Pharmacy Initial call taken by: Dorene Sorrow RN,  June 17, 2006 11:12 AM  Follow-up for Phone Call Details for Follow-up Action Taken: Preload not signed .  Refill authorized by hand in chart. Please aks Dr. Allena Katz to sign preload ASAP. Follow-up by: Duncan Dull MD,  June 17, 2006 11:47 AM  Fax back to pharmacy for 2 refills.
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
## 2010-07-05 NOTE — Assessment & Plan Note (Signed)
Summary: 1 episode of chest discomfort 12/29, refuses er, wants lebaue...   Vital Signs:  Patient profile:   55 year old female Height:      67.75 inches (172.09 cm) Weight:      169.7 pounds (77.14 kg) BMI:     26.09 O2 Sat:      100 % on Room air Temp:     96.9 degrees F (36.06 degrees C) oral Pulse rate:   82 / minute BP sitting:   121 / 77  (right arm)  Vitals Entered By: [name] Ditzler RN (June 05, 2009 9:42 AM)  O2 Flow:  Room air Is Patient Diabetic? No Pain Assessment Patient in pain? yes     Location: left breast,left chest and left back Intensity: 4 Onset of pain  past 6 days Nutritional Status BMI of 25 - 29 = overweight Nutritional Status Detail appetite down  Have you ever been in a relationship where you felt threatened, hurt or afraid?denies   Does patient need assistance? Functional Status Self care Ambulation Normal Comments Severe pain left breast and chest area and left back.  now sore. Pain lasted , dizzy and SOB. Pain in thigh area.    Primary Care Provider:  Joaquin Courts  MD   History of Present Illness: This is a 55 year old woman with past medical history of multiple medical conditions including several visits with atypical chest pain and negative workups in 2009 (ACS rule out) and 2010 (myoview) and some obstructive signs on PFT's 5/10.  Also anxiety and depression recently referred to psych.  She is here for acute visit for chest pain which occured 4 days ago.  Was a very sharp pain in the left chest that woke her from sleep, got up and felt like she could pass out, was very dizzy.  Episode lasted about 15 mintues.  No diaphoresis, did have nausea.  Ever since she has had groin pain.  No repeat episodes.  She has been exerting herself over the past few days- gone up the stairs and lifted her mother with no chest pain.        Depression History:      The patient denies a depressed mood most of the day and a diminished interest in her  usual daily activities.         Preventive Screening-Counseling & Management  Alcohol-Tobacco     Alcohol drinks/day: 0     Smoking Status: current     Packs/Day: 2 packs per day  Caffeine-Diet-Exercise     Does Patient Exercise: no  Current Medications (verified): 1)  Prevacid 30 Mg Cpdr (Lansoprazole) .... Take 1 Tablet By Mouth Once A Day 2)  Albuterol 90 Mcg/act Aers (Albuterol) .... Inhale 1 Puff As Needed For Shortness of Breath and Wheezing 3)  Calcium 600mg  + Vitamin D 1000 .... Take 1 Tablet By Mouth Two Times A Day 4)  Alprazolam 1 Mg Tabs (Alprazolam) .... Take 1 Tablet By Mouth Three Times A Day.  Allergies: 1)  ! Aspirin 2)  Sulfa  Social History: Smoking Status:  current Packs/Day:  2 packs per day Does Patient Exercise:  no  Review of Systems       per hpi  Physical Exam  General:  alert and well-developed.   Head:  normocephalic and atraumatic.   Mouth:  pharynx pink and moist and teeth missing.   Neck:  supple, full ROM, and no masses.   Chest Wall:  there is tenderness to  palpation over the left chest wall above the breast. Lungs:  normal respiratory effort and normal breath sounds.   Heart:  normal rate, regular rhythm, and no murmur.   Pulses:  2+ Extremities:  no edema Neurologic:  alert & oriented X3, cranial nerves II-XII intact, strength normal in all extremities, and sensation intact to light touch.   Skin:  no suspicious lesions.   Cervical Nodes:  no anterior cervical adenopathy and no posterior cervical adenopathy.   Psych:  Oriented X3 and memory intact for recent and remote.     Impression & Recommendations:  Problem # 1:  CHEST PAIN (ICD-786.50) With a normal myoview in 2010, normal ECHO in 2009, benign lipid panel in 6/10 and a one time episode, not repeated with exertion I am less concerned that this was cardiac in origin.  She has a risk factor of smoking and family history.  Will check an EKG today.  She saw Dr. Patty Sermons of [place]  cards one year ago.  Will rec NSAIDS/tylenol for chest soreness.  Rec stop smoking.  (EKG normal)  Orders: 12 Lead EKG (12 Lead EKG)  Problem # 2:  DYSLIPIDEMIA (ICD-272.4) recent lipids OK  Labs Reviewed: SGOT: 16 (11/28/2008)   SGPT: 16 (11/28/2008)   HDL:31 (11/28/2008), 42 (04/21/2008)  LDL:107 (11/28/2008), 125  --  06/01/2008 (06/14/2008)  Chol:183 (11/28/2008), 181 (04/21/2008)  Trig:226 (11/28/2008), 120 (04/21/2008)  Problem # 3:  ANXIETY (ICD-300.00) She requests an increased dose of xanax, but says that she has not had time to call and make an apointment with [facility] as previously discussed.  She does not want to be on any antidepressants because she states that she is not depressed.  I let her know that we will wait until she calls about the psychiatrist and will not increase her dose of xanax.  Anxiety likely contributes to her chest pain presentations, though she does not accept this.  Her updated medication list for this problem includes:    Alprazolam 1 Mg Tabs (Alprazolam) .[name]... Take 1 tablet by mouth three times a day.  Complete Medication List: 1)  Prevacid 30 Mg Cpdr (Lansoprazole) .... Take 1 tablet by mouth once a day 2)  Albuterol 90 Mcg/act Aers (Albuterol) .... Inhale 1 puff as needed for shortness of breath and wheezing 3)  Calcium 600mg  + Vitamin D 1000  .... Take 1 tablet by mouth two times a day 4)  Alprazolam 1 Mg Tabs (Alprazolam) .... Take 1 tablet by mouth three times a day.  Patient Instructions: 1)  Your chest pain is most likely not related to your heart.  The test we did today is normal. The tests that you have had in the past are good tests, and were negative.   2)  You should try ibuprofin for the tenderness in your left chest. 3)  The best thing you can do for your heart is stop smoking. 4)  Please follow up with the psychiatrist.   Prevention & Chronic Care Immunizations   Influenza vaccine: Fluvax Non-MCR  (03/06/2007)   Influenza vaccine  deferral: Deferred  (11/28/2008)   Influenza vaccine due: 02/01/2009    Tetanus booster: Not documented    Pneumococcal vaccine: Not documented  Colorectal Screening   Hemoccult: Not documented   Hemoccult action/deferral: Deferred  (11/28/2008)    Colonoscopy: polyp  Hyperplastic polyp     (07/08/2006)   Colonoscopy due: 07/2011  Other Screening   Pap smear: NEGATIVE FOR INTRAEPITHELIAL LESIONS OR MALIGNANCY.  (04/21/2008)  Pap smear due: 03/2007    Mammogram: ASSESSMENT: Negative - BI-RADS 1^MM DIGITAL SCREENING  (08/30/2008)   Mammogram due: 08/2007   Smoking status: current  (06/05/2009)   Smoking cessation counseling: yes  (04/20/2009)  Lipids   Total Cholesterol: 183  (11/28/2008)   LDL: 107  (11/28/2008)   LDL Direct: Not documented   HDL: 31  (11/28/2008)   Triglycerides: 226  (11/28/2008)    SGOT (AST): 16  (11/28/2008)   SGPT (ALT): 16  (11/28/2008)   Alkaline phosphatase: 72  (11/28/2008)   Total bilirubin: 0.4  (11/28/2008)  Self-Management Support :    Patient will work on the following items until the next clinic visit to reach self-care goals:     Medications and monitoring: take my medicines every day, weigh myself weekly  (06/05/2009)     Eating: drink diet soda or water instead of juice or soda, eat more vegetables, use fresh or frozen vegetables, eat foods that are low in salt, eat baked foods instead of fried foods, eat fruit for snacks and desserts, limit or avoid alcohol  (06/05/2009)    Lipid self-management support: Written self-care plan  (02/20/2009)
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
## 2010-07-05 NOTE — Assessment & Plan Note (Signed)
Summary: Flu Vaccine//kg  Nurse Visit    Prior Medications: PREVACID 30 MG CPDR (LANSOPRAZOLE) Take 1 tablet by mouth once a day ALBUTEROL 90 MCG/ACT AERS (ALBUTEROL) Inhale 1 puff as needed for shortness of breath and wheezing OYSTERCAL 500 + D 500-125 MG-UNIT TABS (CALCIUM CARBONATE-VITAMIN D) Take 1 tablet by mouth three times a day NICOTROL NS 10 MG/ML SOLN (NICOTINE) Use 1-2 sprays per hour. ALLEGRA 180 MG TABS (FEXOFENADINE HCL) Take 1 tablet by mouth once a day for allergies KLONOPIN 0.5 MG  TABS (CLONAZEPAM) Take 1 tablet by mouth two times a day CLOTRIMAZOLE ANTI-FUNGAL 1 %  CREA (CLOTRIMAZOLE) Apply twice a day to affected area. SPIRIVA HANDIHALER 18 MCG  CAPS (TIOTROPIUM BROMIDE MONOHYDRATE) 1 puff each day. Current Allergies: SULFA   Influenza Vaccine    Vaccine Type: Fluvax Non-MCR    Site: right deltoid    Mfr: novartis    Dose: 0.5 ml    Route: IM    Given by: Krystal Eaton ([facility])    Exp. Date: 12/01/2007    Lot #: [id number]    VIS given: 11/30/04 version given March 06, 2007.  Flu Vaccine Consent Questions    Do you have a history of severe allergic reactions to this vaccine? no    Any prior history of allergic reactions to egg and/or gelatin? no    Do you have a sensitivity to the preservative Thimersol? no    Do you have a past history of Guillan-Barre Syndrome? no    Do you currently have an acute febrile illness? no    Have you ever had a severe reaction to latex? no    Vaccine information given and explained to patient? yes    Are you currently pregnant? no   Orders Added: 1)  Influenza Vaccine NON MCR [00028] 2)  Admin 1st Vaccine [email or web address]    ]

## 2010-07-05 NOTE — Assessment & Plan Note (Signed)
Summary: flu vaccine update  Nurse Visit    Prior Medications: PREVACID 30 MG CPDR (LANSOPRAZOLE) Take 1 tablet by mouth once a day ALBUTEROL 90 MCG/ACT AERS (ALBUTEROL) Inhale 1 puff as needed for shortness of breath and wheezing OYSTERCAL 500 + D 500-125 MG-UNIT TABS (CALCIUM CARBONATE-VITAMIN D) Take 1 tablet by mouth three times a day NICOTROL 10 MG  INHA (NICOTINE) Use up to 15 cartridges a day as needed to keep from smoking. XANAX XR 0.5 MG  TB24 (ALPRAZOLAM) Take one tablet by mouth once daily. Current Allergies: SULFA  vaccine given at [facility], lot number [id number] Dorie Rank RN  February 18, 2008 2:49 PM     ]
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
## 2010-07-05 NOTE — Progress Notes (Addendum)
Summary: Gas pain  Phone Note Call from Patient   Caller: Patient Call For: Rosana Berger MD Summary of Call: Call from pt said that she is having Sinus Headaches level of 9 or 10.  No blurred vision.  Is not taking anything for this.  Pain pills make her stomach help.  Pt is also having gasteitis was given Omeprazole with no relief.  Has to the ED was told to come in next Monday.  Pt said that she is having spasms in her stomach.  Pt is having Nausea.  No blood in stools.  Just passing a lot of gas.  Has an appointment for next Monday. Initial call taken by: Angelina Ok RN,  June 19, 2010 3:55 PM  Follow-up for Phone Call        Hard to tell from your note how serious this is.  should you offer her urgent care or ER? Follow-up by: Ulyess Mort MD,  June 19, 2010 4:40 PM  Additional Follow-up for Phone Call Additional follow up Details #1::        RTC to pt.  Given an appointment for tomorrow pm.  Pt declined stating that she had no one to stay withher mother.  Pt to be called in the amfor cancellations.  Pt said that she has had gasteirtis before.  Sppke with Dr. Aundria Rud who suggested Maalox. Pt said that she will try. Additional Follow-up by: Angelina Ok RN,  June 19, 2010 5:05 PM    Additional Follow-up for Phone Call Additional follow up Details #2::    Call from pt this am said that she is having problems urinating.  Painful when she voids.  No blood in her urine.  Throbbing back pain.  Pt given an appointment for this afternoon at 2:30 PM.  Pt stated that she was told that she has chronic Kidney Disease. Follow-up by: Angelina Ok RN,  June 20, 2010 9:06 AM

## 2010-07-05 NOTE — Assessment & Plan Note (Signed)
Summary: cough/gg   Vital Signs:  Patient profile:   55 year old female Height:      67.75 inches (172.09 cm) Weight:      170.7 pounds (77.59 kg) BMI:     26.24 O2 Sat:      100 % on Room air Temp:     97.0 degrees F (36.11 degrees C) oral Pulse rate:   73 / minute BP sitting:   124 / 77  (left arm)  Vitals Entered By: [name] Ditzler RN (November 09, 2008 9:57 AM)  O2 Flow:  Room air CC: Depression Is Patient Diabetic? No Pain Assessment Patient in pain? yes     Location: throat Intensity: 1-4 Onset of pain  past 2 weeks Nutritional Status BMI of 25 - 29 = overweight Nutritional Status Detail appetite good  Have you ever been in a relationship where you felt threatened, hurt or afraid?denies   Does patient need assistance? Functional Status Self care Ambulation Normal Comments Sore throat past 2 weeks. Yellow-white productive cough. "Knot" right side of throat. Discuss holter monitor results.   Primary Care Provider:  Joaquin Courts  MD  CC:  Depression.  History of Present Illness: Michele [name] is a 55 yo lady with PMH as outlined in the EMR comes today for following:  1) URI: The pt. started to get sore throat for last 10 days when she drove to [place] with her window open. She has dry hacking cough and it's worse at night time. She also has a "knot" on her neck. No fever, chills, CP. Occasional SOB. Little runny nose. No HA, earache. Pt. doesn't smoke.   Depression History:      The patient denies a depressed mood most of the day and a diminished interest in her usual daily activities.         Preventive Screening-Counseling & Management  Alcohol-Tobacco     Alcohol drinks/day: 0     Smoking Status: quit     Smoking Cessation Counseling: yes     Packs/Day: 2     Year Started: 1970     Year Quit: JAN - 2009  Caffeine-Diet-Exercise     Does Patient Exercise: yes     Type of exercise: YARD WORK     Times/week: 2  Current Medications (verified): 1)   Prevacid 30 Mg Cpdr (Lansoprazole) .... Take 1 Tablet By Mouth Once A Day 2)  Albuterol 90 Mcg/act Aers (Albuterol) .... Inhale 1 Puff As Needed For Shortness of Breath and Wheezing 3)  Calcium 600/vitamin D 600-400 Mg-Unit Chew (Calcium Carbonate-Vitamin D) .... Take 1 Tablet By Mouth Two Times A Day 4)  Alprazolam 1 Mg Tabs (Alprazolam) .... Take 1/2  Tablet By Mouth Up To Twice A Day As Needed For Anxiety. 5)  Meclizine Hcl 25 Mg Tabs (Meclizine Hcl) .[name].. 1 By Mouth Two Times A Day As Needed Vertigo 6)  Prozac 20 Mg Caps (Fluoxetine Hcl) .... Take One Tab By Mouth Each Morning 7)  Robitussin Dm 100-10 Mg/6ml Syrp (Dextromethorphan-Guaifenesin) .... Take 5 Ml By Mouth Three Times A Day For 7 Days 8)  Zyrtec Hives Relief 10 Mg Tabs (Cetirizine Hcl) .... Take 1 Tablet By Mouth Daily For 7 Days  Allergies: 1)  Sulfa  Review of Systems      See HPI  Physical Exam  General:  alert.   Ears:  R ear normal and L ear normal.   Nose:  no sinus percussion tenderness, including mastoid sinus.  Mouth:  pharynx pink and moist, no erythema, no exudates, no posterior lymphoid hypertrophy, and no postnasal drip.   Lungs:  normal breath sounds, no crackles, and no wheezes.   Heart:  normal rate, regular rhythm, no murmur, no gallop, and no rub.   Abdomen:  soft and non-tender.   Extremities:  trace left pedal edema and trace right pedal edema.   Neurologic:  alert & oriented X3.   Cervical Nodes:  no posterior cervical adenopathy and R anterior LN tender.     Impression & Recommendations:  Problem # 1:  SORE THROAT (ICD-462) I think this is viral. Will treat symptomatically with cough suppressant, antihistamine, plenty of fluids and warm water gargle. If doesn't improve pt. to call the clinic.   Complete Medication List: 1)  Prevacid 30 Mg Cpdr (Lansoprazole) .... Take 1 tablet by mouth once a day 2)  Albuterol 90 Mcg/act Aers (Albuterol) .... Inhale 1 puff as needed for shortness of breath and  wheezing 3)  Calcium 600/vitamin D 600-400 Mg-unit Chew (Calcium carbonate-vitamin d) .... Take 1 tablet by mouth two times a day 4)  Alprazolam 1 Mg Tabs (Alprazolam) .... Take 1/2  tablet by mouth up to twice a day as needed for anxiety. 5)  Meclizine Hcl 25 Mg Tabs (Meclizine hcl) .[name].. 1 by mouth two times a day as needed vertigo 6)  Prozac 20 Mg Caps (Fluoxetine hcl) .... Take one tab by mouth each morning 7)  Robitussin Dm 100-10 Mg/56ml Syrp (Dextromethorphan-guaifenesin) .... Take 5 ml by mouth three times a day for 7 days 8)  Zyrtec Hives Relief 10 Mg Tabs (Cetirizine hcl) .... Take 1 tablet by mouth daily for 7 days  Patient Instructions: 1)  Follow up with your regular doctor per prior schedule.  2)  If your cough gets worse and you have fever call the clinic. Prescriptions: ZYRTEC HIVES RELIEF 10 MG TABS (CETIRIZINE HCL) take 1 tablet by mouth daily for 7 days  #20 x 0   Entered and Authorized by:   Jason Coop MD   Signed by:   Jason Coop MD on 11/09/2008   Method used:   Print then Give to Patient   RxID:   [id number] ROBITUSSIN DM 100-10 MG/5ML SYRP (DEXTROMETHORPHAN-GUAIFENESIN) take 5 ML by mouth three times a day for 7 days  #7 day supply x 1   Entered and Authorized by:   Jason Coop MD   Signed by:   Jason Coop MD on 11/09/2008   Method used:   Print then Give to Patient   RxID:   [id number]
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
## 2010-07-05 NOTE — Medication Information (Signed)
Summary: [facility]  Medicare Pharmacy: [facility]  Medicare Pharmacy: [facility]   Imported By: Florinda Marker 07/21/2009 14:38:18  _____________________________________________________________________  External Attachment:    Type:   Image     Comment:   External Document

## 2010-07-05 NOTE — Progress Notes (Signed)
Summary: Soc. 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  She had questions about being set up with a private practice vs. [facility] and I explained to her that most private practices do not accept Medicare and if they do she will be left with a large copayment.   So [facility] was best option.   She declined to visit with me but said she would call the [facility] number and arrange for apptmt.  I told her to let me know if that does not work out.      Appended Document: Soc. Work Will call patient in two weeks to see how she did.

## 2010-07-05 NOTE — Progress Notes (Signed)
Summary: prior authorization denied-Nicotrol inhaler/gp  Phone Note Other Incoming   Summary of Call: Prior Authorization for Nicotrol Inhaler  was denied per [facility].  Nicotrol Nasal Spray is on the preferred list. Initial call taken by: Chinita Pester RN,  December 20, 2009 11:28 AM  Follow-up for Phone Call        I have changed to nicotrol nasal.  I believe the patient may not be interested, but this is the one her insurance will cover.  Please call and inform patient of change.  It is ok if she does not wish to use. Follow-up by: Mariea Stable MD,  December 20, 2009 12:28 PM  Additional Follow-up for Phone Call Additional follow up Details #1::        Pt. states she cannot use the nasal b/c it makes her nose bleed. And the patch gives her a rash and cannot take the pillsb/c hx. of seizures. Wants to know if this was put on the PA form. Additional Follow-up by: Chinita Pester RN,  December 20, 2009 4:47 PM    Additional Follow-up for Phone Call Additional follow up Details #2::    I can fill the PA again with those details.  I only mentioned that she did not tolerate other therapies.  Can we get another copy in my box and I will gladly fill it out again? Follow-up by: Mariea Stable MD,  December 21, 2009 10:00 AM  Additional Follow-up for Phone Call Additional follow up Details #3:: Details for Additional Follow-up Action Taken: Dr. Onalee Hua, I received another prior authorization form for you to complete on Nicotrol Cartilage Inhaler. I will put it in your clinic box.   Dr. Anselm Jungling is PCP and she is in [facility] this month.  Can you please give her the prior authorization form along with the info in this note for her to fill out?    Thanks, Mariea Stable Additional Follow-up by: Chinita Pester RN,  January 01, 2010 11:01 AM  New/Updated Medications: NICOTROL NS 10 MG/ML SOLN (NICOTINE) apply 1-2 sprays in each nostril every 1 hour x 8 weeks then taper. Prescriptions: NICOTROL NS 10 MG/ML SOLN  (NICOTINE) apply 1-2 sprays in each nostril every 1 hour x 8 weeks then taper.  #1 x 3   Entered by:   Chinita Pester RN   Authorized by:   Mariea Stable MD   Signed by:   Chinita Pester RN on 01/01/2010   Method used:   Telephoned to ...         RxID:   [id number]   Appended Document: prior authorization denied-Nicotrol inhaler/gp PA form for Nicotrol inhaler completed by Dr. Anselm Jungling and faxed.

## 2010-07-05 NOTE — Miscellaneous (Signed)
  Clinical Lists Changes  Observations: Added new observation of PAP SMEAR: normal (03/31/2007 14:21)       Preventive Care Screening  Pap Smear:    Date:  03/31/2007    Results:  normal

## 2010-07-10 ENCOUNTER — Ambulatory Visit (INDEPENDENT_AMBULATORY_CARE_PROVIDER_SITE_OTHER): Payer: Medicare Other | Admitting: Cardiovascular Disease

## 2010-07-10 ENCOUNTER — Encounter: Payer: Self-pay | Admitting: Cardiovascular Disease

## 2010-07-10 DIAGNOSIS — F17200 Nicotine dependence, unspecified, uncomplicated: Secondary | ICD-10-CM

## 2010-07-10 DIAGNOSIS — J449 Chronic obstructive pulmonary disease, unspecified: Secondary | ICD-10-CM

## 2010-07-10 DIAGNOSIS — R072 Precordial pain: Secondary | ICD-10-CM

## 2010-07-18 ENCOUNTER — Encounter (INDEPENDENT_AMBULATORY_CARE_PROVIDER_SITE_OTHER): Payer: Medicare Other

## 2010-07-18 ENCOUNTER — Encounter: Payer: Self-pay | Admitting: Internal Medicine

## 2010-07-18 ENCOUNTER — Telehealth (INDEPENDENT_AMBULATORY_CARE_PROVIDER_SITE_OTHER): Payer: Self-pay | Admitting: *Deleted

## 2010-07-18 DIAGNOSIS — J449 Chronic obstructive pulmonary disease, unspecified: Secondary | ICD-10-CM

## 2010-07-19 ENCOUNTER — Encounter: Payer: Self-pay | Admitting: Cardiovascular Disease

## 2010-07-19 ENCOUNTER — Ambulatory Visit (HOSPITAL_COMMUNITY): Payer: Medicare Other | Attending: Cardiovascular Disease

## 2010-07-19 DIAGNOSIS — R42 Dizziness and giddiness: Secondary | ICD-10-CM | POA: Insufficient documentation

## 2010-07-19 DIAGNOSIS — R5383 Other fatigue: Secondary | ICD-10-CM | POA: Insufficient documentation

## 2010-07-19 DIAGNOSIS — R002 Palpitations: Secondary | ICD-10-CM | POA: Insufficient documentation

## 2010-07-19 DIAGNOSIS — J4489 Other specified chronic obstructive pulmonary disease: Secondary | ICD-10-CM | POA: Insufficient documentation

## 2010-07-19 DIAGNOSIS — J449 Chronic obstructive pulmonary disease, unspecified: Secondary | ICD-10-CM | POA: Insufficient documentation

## 2010-07-19 DIAGNOSIS — R5381 Other malaise: Secondary | ICD-10-CM | POA: Insufficient documentation

## 2010-07-19 DIAGNOSIS — E785 Hyperlipidemia, unspecified: Secondary | ICD-10-CM | POA: Insufficient documentation

## 2010-07-19 DIAGNOSIS — Z8249 Family history of ischemic heart disease and other diseases of the circulatory system: Secondary | ICD-10-CM | POA: Insufficient documentation

## 2010-07-19 DIAGNOSIS — R072 Precordial pain: Secondary | ICD-10-CM
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
## 2010-07-25 NOTE — Progress Notes (Signed)
Summary: Stress Echo appt  Phone Note Outgoing Call Call back at [facility] Phone [phone]   Call placed by: [name], EMT-P,  July 18, 2010 11:16 AM Action Taken: Phone Call Completed Summary of Call: Left message ref: stress echo appt. [name], EMT-P  July 18, 2010 11:16 AM

## 2010-07-31 NOTE — Assessment & Plan Note (Signed)
Summary: PFT//SH   Allergies: 1)  ! Aspirin 2)  Sulfa   Other Orders: Carbon Monoxide diffusing w/capacity (16109) Lung Volumes/Gas dilution or washout (60454) Spirometry (Pre & Post) [phone])

## 2010-08-13 ENCOUNTER — Other Ambulatory Visit: Payer: Self-pay | Admitting: Internal Medicine

## 2010-08-14 ENCOUNTER — Other Ambulatory Visit: Payer: Self-pay | Admitting: Internal Medicine

## 2010-08-14 NOTE — Telephone Encounter (Signed)
Refill called to the [facility] on [address].

## 2010-08-14 NOTE — Telephone Encounter (Signed)
Called to [facility].
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
## 2010-08-24 ENCOUNTER — Encounter: Payer: Self-pay | Admitting: Internal Medicine

## 2010-08-24 DIAGNOSIS — R0789 Other chest pain: Secondary | ICD-10-CM

## 2010-08-24 DIAGNOSIS — M79605 Pain in left leg: Secondary | ICD-10-CM | POA: Insufficient documentation

## 2010-08-24 DIAGNOSIS — M79604 Pain in right leg: Secondary | ICD-10-CM | POA: Insufficient documentation

## 2010-08-24 DIAGNOSIS — G40909 Epilepsy, unspecified, not intractable, without status epilepticus: Secondary | ICD-10-CM | POA: Insufficient documentation

## 2010-08-24 DIAGNOSIS — G8929 Other chronic pain: Secondary | ICD-10-CM

## 2010-08-24 DIAGNOSIS — S81032A Puncture wound without foreign body, left knee, initial encounter: Secondary | ICD-10-CM | POA: Insufficient documentation

## 2010-08-24 DIAGNOSIS — W3400XA Accidental discharge from unspecified firearms or gun, initial encounter: Secondary | ICD-10-CM | POA: Insufficient documentation

## 2010-08-27 ENCOUNTER — Other Ambulatory Visit: Payer: Self-pay | Admitting: Internal Medicine

## 2010-08-27 NOTE — Telephone Encounter (Signed)
Michele [name], according to [employer], pt has future appt with Dr Felicity Coyer at [place]. Would you pls follow up with pt and see if she is changing providers? Thanks!

## 2010-08-28 NOTE — Telephone Encounter (Signed)
Will ask pharm to put hold on script

## 2010-09-05 LAB — BASIC METABOLIC PANEL
BUN: 9 mg/dL (ref 6–23)
CO2: 28 mEq/L (ref 19–32)
Calcium: 9.1 mg/dL (ref 8.4–10.5)
Chloride: 105 mEq/L (ref 96–112)
Creatinine, Ser: 0.63 mg/dL (ref 0.4–1.2)
GFR calc Af Amer: >60 mL/min (ref >60)
GFR calc non Af Amer: >60 mL/min (ref >60)
Glucose, Bld: 85 mg/dL (ref 70–99)
Potassium: 3.6 mEq/L (ref 3.5–5.1)
Sodium: 140 mEq/L (ref 135–145)

## 2010-09-05 LAB — CBC
HCT: 38.1 % (ref 36.0–46.0)
Hemoglobin: 13.2 g/dL (ref 12.0–15.0)
MCHC: 34.5 g/dL (ref 30.0–36.0)
MCV: 88.7 fL (ref 78.0–100.0)
Platelets: 206 10*3/uL (ref 150–400)
RBC: 4.3 MIL/uL (ref 3.87–5.11)
RDW: 13 % (ref 11.5–15.5)
WBC: 5 10*3/uL (ref 4.0–10.5)

## 2010-09-05 LAB — DIFFERENTIAL
Basophils Absolute: 0 10*3/uL (ref 0.0–0.1)
Basophils Relative: 1 % (ref 0–1)
Eosinophils Absolute: 0.1 10*3/uL (ref 0.0–0.7)
Eosinophils Relative: 2 % (ref 0–5)
Lymphocytes Relative: 34 % (ref 12–46)
Lymphs Abs: 1.7 10*3/uL (ref 0.7–4.0)
Monocytes Absolute: 0.4 10*3/uL (ref 0.1–1.0)
Monocytes Relative: 8 % (ref 3–12)
Neutro Abs: 2.8 10*3/uL (ref 1.7–7.7)
Neutrophils Relative %: 57 % (ref 43–77)

## 2010-09-05 LAB — POCT CARDIAC MARKERS
CKMB, poc: <1 ng/mL — ABNORMAL LOW (ref 1.0–8.0)
CKMB, poc: <1 ng/mL — ABNORMAL LOW (ref 1.0–8.0)
Myoglobin, poc: 39.8 ng/mL (ref 12–200)
Myoglobin, poc: 46.9 ng/mL (ref 12–200)
Troponin i, poc: <0.05 ng/mL (ref 0.00–0.09)
Troponin i, poc: <0.05 ng/mL (ref 0.00–0.09)

## 2010-09-05 LAB — POCT I-STAT, CHEM 8
BUN: 10 mg/dL (ref 6–23)
Calcium, Ion: 1.22 mmol/L (ref 1.12–1.32)
Chloride: 103 mEq/L (ref 96–112)
Creatinine, Ser: 0.8 mg/dL (ref 0.4–1.2)
Glucose, Bld: 77 mg/dL (ref 70–99)
HCT: 37 % (ref 36.0–46.0)
Hemoglobin: 12.6 g/dL (ref 12.0–15.0)
Potassium: 4.1 mEq/L (ref 3.5–5.1)
Sodium: 140 mEq/L (ref 135–145)
TCO2: 28 mmol/L (ref 0–100)

## 2010-09-05 LAB — D-DIMER, QUANTITATIVE: D-Dimer, Quant: 0.45 ug/mL-FEU (ref 0.00–0.48)

## 2010-09-11 LAB — POCT I-STAT, CHEM 8
BUN: 13 mg/dL (ref 6–23)
BUN: 14 mg/dL (ref 6–23)
Calcium, Ion: 1.18 mmol/L (ref 1.12–1.32)
Calcium, Ion: 1.23 mmol/L (ref 1.12–1.32)
Chloride: 105 mEq/L (ref 96–112)
Chloride: 106 mEq/L (ref 96–112)
Creatinine, Ser: 0.9 mg/dL (ref 0.4–1.2)
Creatinine, Ser: 0.9 mg/dL (ref 0.4–1.2)
Glucose, Bld: 104 mg/dL — ABNORMAL HIGH (ref 70–99)
Glucose, Bld: 97 mg/dL (ref 70–99)
HCT: 41 % (ref 36.0–46.0)
HCT: 45 % (ref 36.0–46.0)
Hemoglobin: 13.9 g/dL (ref 12.0–15.0)
Hemoglobin: 15.3 g/dL — ABNORMAL HIGH (ref 12.0–15.0)
Potassium: 3.9 mEq/L (ref 3.5–5.1)
Potassium: 4 mEq/L (ref 3.5–5.1)
Sodium: 141 mEq/L (ref 135–145)
Sodium: 142 mEq/L (ref 135–145)
TCO2: 23 mmol/L (ref 0–100)
TCO2: 27 mmol/L (ref 0–100)

## 2010-09-11 LAB — POCT URINALYSIS DIP (DEVICE)
Bilirubin Urine: NEGATIVE
Glucose, UA: NEGATIVE mg/dL
Hgb urine dipstick: NEGATIVE
Nitrite: NEGATIVE
Protein, ur: NEGATIVE mg/dL
Specific Gravity, Urine: 1.01 (ref 1.005–1.030)
Urobilinogen, UA: 0.2 mg/dL (ref 0.0–1.0)
pH: 6 (ref 5.0–8.0)

## 2010-09-11 LAB — URINALYSIS, ROUTINE W REFLEX MICROSCOPIC
Bilirubin Urine: NEGATIVE
Glucose, UA: NEGATIVE mg/dL
Hgb urine dipstick: NEGATIVE
Ketones, ur: NEGATIVE mg/dL
Nitrite: NEGATIVE
Protein, ur: NEGATIVE mg/dL
Specific Gravity, Urine: 1.005 (ref 1.005–1.030)
Urobilinogen, UA: 0.2 mg/dL (ref 0.0–1.0)
pH: 6.5 (ref 5.0–8.0)

## 2010-09-11 LAB — DIFFERENTIAL
Basophils Absolute: 0 K/uL (ref 0.0–0.1)
Basophils Relative: 0 % (ref 0–1)
Eosinophils Absolute: 0.1 K/uL (ref 0.0–0.7)
Eosinophils Relative: 1 % (ref 0–5)
Lymphocytes Relative: 28 % (ref 12–46)
Lymphs Abs: 1.5 K/uL (ref 0.7–4.0)
Monocytes Absolute: 0.4 K/uL (ref 0.1–1.0)
Monocytes Relative: 8 % (ref 3–12)
Neutro Abs: 3.3 K/uL (ref 1.7–7.7)
Neutrophils Relative %: 63 % (ref 43–77)

## 2010-09-11 LAB — CBC
HCT: 40.1 % (ref 36.0–46.0)
Hemoglobin: 13.8 g/dL (ref 12.0–15.0)
MCHC: 34.3 g/dL (ref 30.0–36.0)
MCV: 87.7 fL (ref 78.0–100.0)
Platelets: 206 10*3/uL (ref 150–400)
RBC: 4.57 MIL/uL (ref 3.87–5.11)
RDW: 12.9 % (ref 11.5–15.5)
WBC: 5.3 10*3/uL (ref 4.0–10.5)

## 2010-09-11 LAB — POCT CARDIAC MARKERS
CKMB, poc: <1 ng/mL — ABNORMAL LOW (ref 1.0–8.0)
CKMB, poc: <1 ng/mL — ABNORMAL LOW (ref 1.0–8.0)
Myoglobin, poc: 28.2 ng/mL (ref 12–200)
Myoglobin, poc: 40.7 ng/mL (ref 12–200)
Troponin i, poc: <0.05 ng/mL (ref 0.00–0.09)
Troponin i, poc: <0.05 ng/mL (ref 0.00–0.09)

## 2010-09-17 LAB — POCT RAPID STREP A (OFFICE): Streptococcus, Group A Screen (Direct): NEGATIVE

## 2010-09-24 ENCOUNTER — Inpatient Hospital Stay (HOSPITAL_COMMUNITY)
Admission: AD | Admit: 2010-09-24 | Discharge: 2010-09-24 | Disposition: A | Discharge status: Home / Self Care | Payer: Medicare Other | Source: Ambulatory Visit | Attending: Obstetrics & Gynecology | Admitting: Obstetrics & Gynecology

## 2010-09-24 ENCOUNTER — Inpatient Hospital Stay (HOSPITAL_COMMUNITY): Payer: Medicare Other

## 2010-09-24 ENCOUNTER — Encounter: Payer: Self-pay | Admitting: Internal Medicine

## 2010-09-24 DIAGNOSIS — R1031 Right lower quadrant pain: Secondary | ICD-10-CM

## 2010-09-24 LAB — COMPREHENSIVE METABOLIC PANEL
ALT: 12 U/L (ref 0–35)
AST: 13 U/L (ref 0–37)
Albumin: 3.8 g/dL (ref 3.5–5.2)
Alkaline Phosphatase: 61 U/L (ref 39–117)
BUN: 12 mg/dL (ref 6–23)
CO2: 28 mEq/L (ref 19–32)
Calcium: 9.6 mg/dL (ref 8.4–10.5)
Chloride: 109 mEq/L (ref 96–112)
Creatinine, Ser: 0.68 mg/dL (ref 0.4–1.2)
GFR calc Af Amer: >60 mL/min (ref >60)
GFR calc non Af Amer: >60 mL/min (ref >60)
Glucose, Bld: 97 mg/dL (ref 70–99)
Potassium: 4.6 mEq/L (ref 3.5–5.1)
Sodium: 142 mEq/L (ref 135–145)
Total Bilirubin: 0.4 mg/dL (ref 0.3–1.2)
Total Protein: 6.6 g/dL (ref 6.0–8.3)

## 2010-09-24 LAB — DIFFERENTIAL
Basophils Absolute: 0 10*3/uL (ref 0.0–0.1)
Basophils Relative: 0 % (ref 0–1)
Eosinophils Absolute: 0.1 10*3/uL (ref 0.0–0.7)
Eosinophils Relative: 1 % (ref 0–5)
Lymphocytes Relative: 18 % (ref 12–46)
Lymphs Abs: 1.3 10*3/uL (ref 0.7–4.0)
Monocytes Absolute: 0.4 10*3/uL (ref 0.1–1.0)
Monocytes Relative: 6 % (ref 3–12)
Neutro Abs: 5.2 10*3/uL (ref 1.7–7.7)
Neutrophils Relative %: 75 % (ref 43–77)

## 2010-09-24 LAB — CBC
HCT: 42.9 % (ref 36.0–46.0)
Hemoglobin: 14.1 g/dL (ref 12.0–15.0)
MCH: 29.3 pg (ref 26.0–34.0)
MCHC: 32.9 g/dL (ref 30.0–36.0)
MCV: 89.2 fL (ref 78.0–100.0)
Platelets: 187 10*3/uL (ref 150–400)
RBC: 4.81 MIL/uL (ref 3.87–5.11)
RDW: 13.3 % (ref 11.5–15.5)
WBC: 7 10*3/uL (ref 4.0–10.5)

## 2010-09-24 LAB — URINALYSIS, ROUTINE W REFLEX MICROSCOPIC
Bilirubin Urine: NEGATIVE
Glucose, UA: NEGATIVE mg/dL
Hgb urine dipstick: NEGATIVE
Ketones, ur: NEGATIVE mg/dL
Nitrite: NEGATIVE
Protein, ur: NEGATIVE mg/dL
Specific Gravity, Urine: 1.025 (ref 1.005–1.030)
Urobilinogen, UA: 0.2 mg/dL (ref 0.0–1.0)
pH: 5 (ref 5.0–8.0)

## 2010-09-24 LAB — WET PREP, GENITAL
Clue Cells Wet Prep HPF POC: NONE SEEN
Trich, Wet Prep: NONE SEEN
Yeast Wet Prep HPF POC: NONE SEEN

## 2010-09-24 LAB — POCT PREGNANCY, URINE: Preg Test, Ur: NEGATIVE

## 2010-09-25 LAB — GC/CHLAMYDIA PROBE AMP, GENITAL
Chlamydia, DNA Probe: NEGATIVE
GC Probe Amp, Genital: NEGATIVE

## 2010-09-28 ENCOUNTER — Emergency Department (HOSPITAL_COMMUNITY): Payer: Medicare Other

## 2010-09-28 ENCOUNTER — Emergency Department (HOSPITAL_COMMUNITY)
Admission: EM | Admit: 2010-09-28 | Discharge: 2010-09-28 | Disposition: A | Discharge status: Home / Self Care | Payer: Medicare Other | Attending: Emergency Medicine | Admitting: Emergency Medicine

## 2010-09-28 DIAGNOSIS — J449 Chronic obstructive pulmonary disease, unspecified: Secondary | ICD-10-CM | POA: Insufficient documentation

## 2010-09-28 DIAGNOSIS — K449 Diaphragmatic hernia without obstruction or gangrene: Secondary | ICD-10-CM | POA: Insufficient documentation

## 2010-09-28 DIAGNOSIS — R110 Nausea: Secondary | ICD-10-CM | POA: Insufficient documentation

## 2010-09-28 DIAGNOSIS — R1031 Right lower quadrant pain: Secondary | ICD-10-CM | POA: Insufficient documentation

## 2010-09-28 DIAGNOSIS — J4489 Other specified chronic obstructive pulmonary disease: Secondary | ICD-10-CM | POA: Insufficient documentation

## 2010-09-28 LAB — DIFFERENTIAL
Basophils Absolute: 0 10*3/uL (ref 0.0–0.1)
Basophils Relative: 0 % (ref 0–1)
Eosinophils Absolute: 0.1 10*3/uL (ref 0.0–0.7)
Eosinophils Relative: 1 % (ref 0–5)
Lymphocytes Relative: 19 % (ref 12–46)
Lymphs Abs: 1.4 10*3/uL (ref 0.7–4.0)
Monocytes Absolute: 0.4 10*3/uL (ref 0.1–1.0)
Monocytes Relative: 5 % (ref 3–12)
Neutro Abs: 5.8 10*3/uL (ref 1.7–7.7)
Neutrophils Relative %: 75 % (ref 43–77)

## 2010-09-28 LAB — CBC
HCT: 41.4 % (ref 36.0–46.0)
Hemoglobin: 13.8 g/dL (ref 12.0–15.0)
MCH: 29.2 pg (ref 26.0–34.0)
MCHC: 33.3 g/dL (ref 30.0–36.0)
MCV: 87.5 fL (ref 78.0–100.0)
Platelets: 196 10*3/uL (ref 150–400)
RBC: 4.73 MIL/uL (ref 3.87–5.11)
RDW: 13.2 % (ref 11.5–15.5)
WBC: 7.8 10*3/uL (ref 4.0–10.5)

## 2010-09-28 LAB — COMPREHENSIVE METABOLIC PANEL
ALT: 13 U/L (ref 0–35)
AST: 20 U/L (ref 0–37)
Albumin: 3.6 g/dL (ref 3.5–5.2)
Alkaline Phosphatase: 62 U/L (ref 39–117)
BUN: 11 mg/dL (ref 6–23)
CO2: 28 mEq/L (ref 19–32)
Calcium: 9.2 mg/dL (ref 8.4–10.5)
Chloride: 109 mEq/L (ref 96–112)
Creatinine, Ser: 0.77 mg/dL (ref 0.4–1.2)
GFR calc Af Amer: >60 mL/min (ref >60)
GFR calc non Af Amer: >60 mL/min (ref >60)
Glucose, Bld: 142 mg/dL — ABNORMAL HIGH (ref 70–99)
Potassium: 3.8 mEq/L (ref 3.5–5.1)
Sodium: 143 mEq/L (ref 135–145)
Total Bilirubin: 0.3 mg/dL (ref 0.3–1.2)
Total Protein: 6.3 g/dL (ref 6.0–8.3)

## 2010-09-28 LAB — POCT PREGNANCY, URINE: Preg Test, Ur: NEGATIVE

## 2010-09-28 LAB — URINALYSIS, ROUTINE W REFLEX MICROSCOPIC
Bilirubin Urine: NEGATIVE
Glucose, UA: NEGATIVE mg/dL
Hgb urine dipstick: NEGATIVE
Ketones, ur: NEGATIVE mg/dL
Nitrite: NEGATIVE
Protein, ur: NEGATIVE mg/dL
Specific Gravity, Urine: 1.02 (ref 1.005–1.030)
Urobilinogen, UA: 0.2 mg/dL (ref 0.0–1.0)
pH: 5.5 (ref 5.0–8.0)

## 2010-09-28 MED ORDER — IOHEXOL 300 MG/ML  SOLN
100.0000 mL | Freq: Once | INTRAMUSCULAR | Status: AC | PRN
  Administered 2010-09-28: 100 mL via INTRAVENOUS

## 2010-09-29 IMAGING — CR DG CHEST 1V PORT
1 series · 1 of 1 positions shown · non-contrast
Comparison: Chest x-ray of 08/05/2007

CLINICAL DATA: Chest pain, shortness of breath

PORTABLE CHEST - 1 VIEW

[AP]
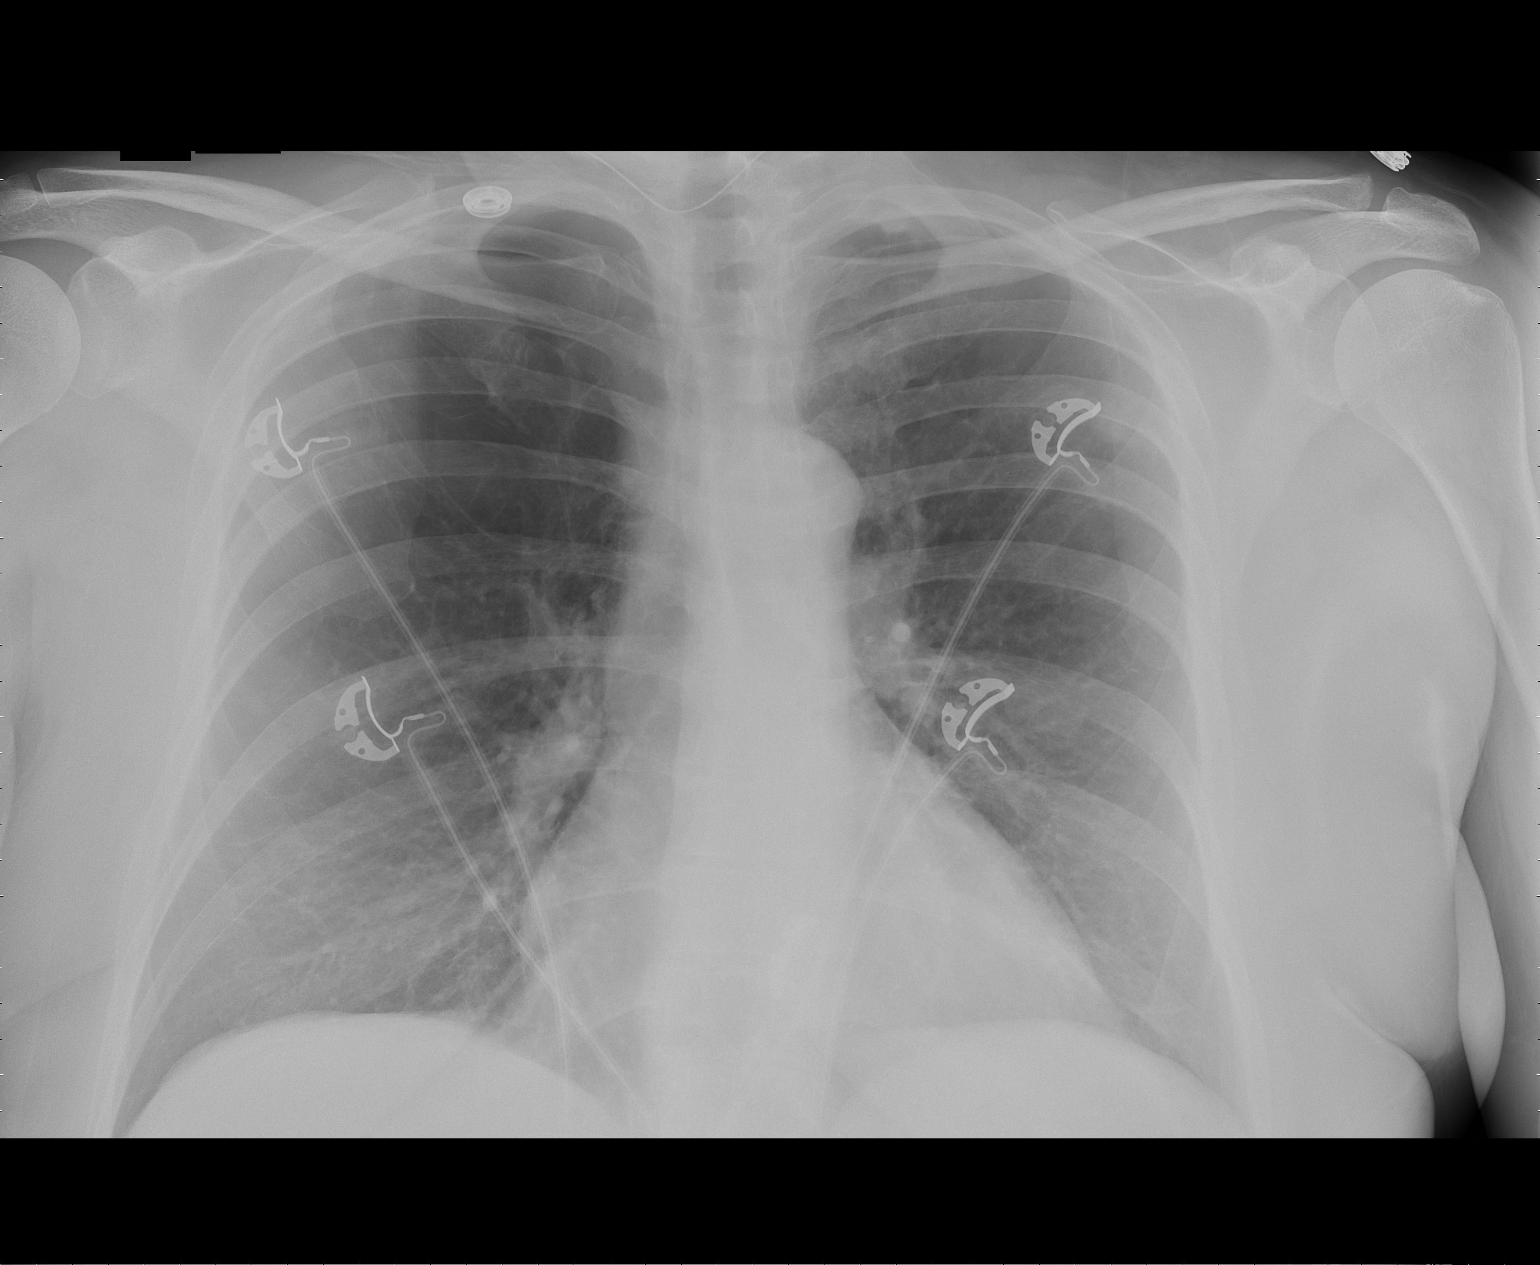

[1 of 1 positions shown; findings below may reference images not displayed]

FINDINGS: No active infiltrate or effusion is seen.  Bullous
changes in the right upper lobe again noted.  The heart is within
normal limits in size.  No bony abnormality is seen.
IMPRESSION: No active lung disease.  No change in bullous changes within the
right upper lobe.

## 2010-10-01 ENCOUNTER — Ambulatory Visit (INDEPENDENT_AMBULATORY_CARE_PROVIDER_SITE_OTHER): Payer: Medicare Other | Admitting: Internal Medicine

## 2010-10-01 ENCOUNTER — Encounter: Payer: Self-pay | Admitting: Internal Medicine

## 2010-10-01 VITALS — BP 110/62 | HR 80 | Temp 98.3°F | Ht 66.0 in | Wt 148.2 lb

## 2010-10-01 DIAGNOSIS — R0789 Other chest pain: Secondary | ICD-10-CM

## 2010-10-01 DIAGNOSIS — J449 Chronic obstructive pulmonary disease, unspecified: Secondary | ICD-10-CM

## 2010-10-01 DIAGNOSIS — K219 Gastro-esophageal reflux disease without esophagitis: Secondary | ICD-10-CM

## 2010-10-01 DIAGNOSIS — F411 Generalized anxiety disorder: Secondary | ICD-10-CM

## 2010-10-01 DIAGNOSIS — R1031 Right lower quadrant pain: Secondary | ICD-10-CM
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
## 2010-10-01 NOTE — Assessment & Plan Note (Signed)
47mo localized pain in RLQ/pelvic region - negative [place] (abd and pelvic 09/24/10) and CT a/p (09/28/10) - labs and UA also unremrkable - discussed and reviewed same with pt in depth today - pt wishes gyn and GI eval for same and will refer as requested - consider IBS with anxiety overlap - pt declines any med tx for same today - abd exam benign on my eval today

## 2010-10-01 NOTE — Patient Instructions (Signed)
It was good to see you today. We have reviewed your prior records including labs and tests today we'll make referral to GI and to gynecology for your pain. Our office will contact you regarding appointment(s) once made. Medications reviewed, no changes at this time. Please schedule followup in 3-4 months for review, call sooner if problems.

## 2010-10-03 ENCOUNTER — Ambulatory Visit: Payer: Medicare Other | Admitting: Gastroenterology

## 2010-10-03 NOTE — Assessment & Plan Note (Signed)
Takes H2B for same - to follow up GI as discussed - EGD 2008

## 2010-10-03 NOTE — Assessment & Plan Note (Signed)
Significant component of anxiety evident on exam today -  discussed med options available to use with scheduled BZ and pt declines same- Reports change from xanax to klonopin 06/2010 to help her anxiety control Ok to continue same and encourage consideration of behav health counseling to augment tx of same

## 2010-10-03 NOTE — Assessment & Plan Note (Signed)
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

## 2010-10-08 ENCOUNTER — Encounter: Payer: Self-pay | Admitting: Internal Medicine

## 2010-10-09 ENCOUNTER — Ambulatory Visit (INDEPENDENT_AMBULATORY_CARE_PROVIDER_SITE_OTHER): Payer: Medicare Other | Admitting: Internal Medicine

## 2010-10-09 ENCOUNTER — Encounter: Payer: Self-pay | Admitting: Internal Medicine

## 2010-10-09 VITALS — BP 132/78 | HR 77 | Temp 98.5°F | Wt 148.8 lb

## 2010-10-09 DIAGNOSIS — F411 Generalized anxiety disorder: Secondary | ICD-10-CM

## 2010-10-09 DIAGNOSIS — R599 Enlarged lymph nodes, unspecified: Secondary | ICD-10-CM

## 2010-10-09 DIAGNOSIS — R590 Localized enlarged lymph nodes: Secondary | ICD-10-CM

## 2010-10-09 DIAGNOSIS — H6500 Acute serous otitis media, unspecified ear: Secondary | ICD-10-CM

## 2010-10-09 DIAGNOSIS — F419 Anxiety disorder, unspecified: Secondary | ICD-10-CM

## 2010-10-09 MED ORDER — CLONAZEPAM 1 MG PO TABS: 1.0000 mg | ORAL_TABLET | Freq: Three times a day (TID) | ORAL | Status: DC | PRN

## 2010-10-09 MED ORDER — AZITHROMYCIN 250 MG PO TABS: 250.0000 mg | ORAL_TABLET | Freq: Every day | ORAL | Status: AC

## 2010-10-09 MED ORDER — ALBUTEROL 90 MCG/ACT IN AERS: 1.0000 | INHALATION_SPRAY | Freq: Four times a day (QID) | RESPIRATORY_TRACT | Status: DC | PRN
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
## 2010-10-09 NOTE — Patient Instructions (Signed)
It was good to see you today. Zpack antibiotics for R ear infection and use tylenol as needed for the soreness -  Refill on clonazepam Your prescription(s) have been submitted to your pharmacy. Please take as directed and contact our office if you believe you are having problem(s) with the medication(s). follow up as previously scheduled, call sooner if problems

## 2010-10-16 NOTE — Discharge Summary (Signed)
Michele [name], [name]               ACCOUNT NO.:  [id number]   MEDICAL RECORD NO.:  [id number]          PATIENT TYPE:  INP   LOCATION:  1829                         FACILITY:  [facility]   PHYSICIAN:  Sid Falcon, CNM  DATE OF BIRTH:  [birth year]   DATE OF ADMISSION:  08/05/2007  DATE OF DISCHARGE:                               DISCHARGE SUMMARY   ADDENDUM   As part of the plan, the patient was informed to report to [facility]  for followup of continuous shortness of breath and difficulty breathing.      Sid Falcon, CNM     WM/MEDQ  D:  08/05/2007  T:  08/05/2007  Job:  [id number]

## 2010-10-16 NOTE — Discharge Summary (Signed)
NAMERAGHAD, LORENZ               ACCOUNT NO.:  [id number]   MEDICAL RECORD NO.:  [id number]          PATIENT TYPE:  WOC   LOCATION:  WOC                          FACILITY:  [facility]   PHYSICIAN:  Sid Falcon, CNM  DATE OF BIRTH:  1955-06-07   DATE OF ADMISSION:  08/05/2007  DATE OF DISCHARGE:                               DISCHARGE SUMMARY   Ms. Self is a 55 year old presenting with a referral from outpatient  clinic for a skin abscess around the mons pubis.  The patient also  reports feeling dizzy, shortness of breath, and rapid heart rate and  felt pressure under the left breast all day yesterday.  The pressure is  not there today.  The patient reports that the lesion appears like  pimple and gets really, really big.  However, it is not present today  for assessment.   In addition there is an increased heart rate and palpitations felt by  the patient.  The patient denies abnormal bleeding or any other  concerns.   The patient reported lower pelvic pain, but denied vaginal bleeding in  the above subjective report.   PHYSICAL EXAMINATION:  Heart rate, regular rate and rhythm. No murmurs,  gallops, or rubs heard.  LUNGS:  Clear to auscultation bilateral.  PELVIC:  No abnormal lesions or masses palpated.   ASSESSMENT:  The pelvic pain as reported by patient.   PLAN:  Pelvic ultrasound and for the patient to go to maternity  admissions for lesions recurrence, and to follow up in 2 weeks for  results of the pelvic ultrasound.      Sid Falcon, CNM     WM/MEDQ  D:  08/05/2007  T:  08/05/2007  Job:  [id number]

## 2010-10-16 NOTE — Discharge Summary (Signed)
Michele [name], Michele [name]               ACCOUNT NO.:  [id number]   MEDICAL RECORD NO.:  [id number]          PATIENT TYPE:  OBV   LOCATION:  3739                         FACILITY:  [facility]   PHYSICIAN:  Manning Charity, MD     DATE OF BIRTH:  10-24-1955   DATE OF ADMISSION:  06/01/2008  DATE OF DISCHARGE:  06/02/2008                               DISCHARGE SUMMARY   DISCHARGE DIAGNOSES:  1. Chest pain with associated shortness of breath:  Most likely      etiology is gastrointestinal with possible related anxiety.      However, the patient does have risk factors for coronary artery      disease and had a new ST inversion in V2, and is thus scheduled to      have a followup Myoview on June 06, 2008, at 11:30 a.m. with Dr.      Patty Sermons of [facility].  2. Right groin pain, resolved.  3. Dyslipidemia.  4. Anxiety and depression.  5. Gastroesophageal reflux disease/hiatal hernia.   DISCHARGE MEDICATIONS:  1. P.o. Protonix 40 mg b.i.d.  2. P.o. Xanax 1 mg b.i.d. p.r.n. anxiety.  3. Sublingual nitroglycerin 0.4 mg p.r.n. chest pain.  4. Calcium with vitamin D take as directed.   Please note that the patient was provided with prescriptions for Xanax,  Protonix, and nitroglycerin.   CONDITION AT DISCHARGE:  Stable, and the patient's chest pain had  improved, and the patient just reports a mild intermittent chest  pressure.  The patient is to have an outpatient Myoview on Monday,  June 07, 2007, and is to follow up with [facility].   The patient is to follow up in the Outpatient Clinic and will have a  followup appointment scheduled soon.  The clinic is currently closed and  we will schedule an appointment shortly for her.  At that time, the  patient should have her chest pain reevaluated and further workup and  possible referral back to her gastroenterologist should be considered.   PROCEDURES:  A chest x-ray was performed on admission that showed no  active lung  disease.  No embolus changes within the right upper lobe.   CONSULTATIONS:  Cassell Clement, MD of [facility] was called  regarding the patient.  A full summary of the patient's presentation was  provided to Dr. Patty Sermons over the phone and he concurred with our  decision to have the patient undergo a Myoview as an outpatient.   ADMISSION HISTORY AND PHYSICAL:  The patient is a 55 year old with  borderline hypertension with systolic blood pressure in the 130s-140s,  untreated, borderline hyperlipidemia with an LDL at 119 and HDL of 28,  borderline diabetes, ex-smoker who quit in January 2009, depression, who  presents with 3-day history of improving chest pain that began as  intermittent left precordial pressure and a pins and needle sensation  with radiation to her left shoulder.  Pain lasts for a few minutes and  is mildly increased with exertion.  No changes with position.  Pain has  improved since starting 3 days ago.  Pain is associated  with mild  shortness of breath, but had been moderately short of breath at  presentation.  The patient also states she experiences mild nausea, but  no vomiting, no diarrhea or constipation.  No diaphoresis.  The patient  does also complain of recent right groin pain that has improved since 3  days prior to admission when it started.  The patient has had this pain  in the past and it is completely gone at presentation.  The patient  denies any recent viral illness.  Her last bowel movement was the night  prior to admission.  The patient admitted in March 2009 with chest pain,  shortness of breath, had an echo that showed overall LVEF to be normal.  There was mild mitral valvular regurgitation.  Left atrium was mildly  dilated.  Per the prior discharge summary, the patient apparently had a  past negative Myoview, but we were unable to locate this in the  patient's chart.   ADMISSION VITAL SIGNS:  Temperature of 96.7, pulse of 81, blood  pressure  of 110/65, and sating at 98% on room air.   ADMISSION PHYSICAL EXAMINATION:  GENERAL:  No apparent distress, lying  comfortably in bed.  EYES:  EOMI, pupils equal and round.  ENT:  Moist membranous mucosa.  NECK:  No lymphadenopathy.  RESPIRATORY:  Clear to auscultation bilaterally.  CARDIOVASCULAR:  Regular rate and rhythm.  No murmurs, rubs, or gallops.  Normal S1 and S2.  GI:  Mild midepigastric tenderness, no distention, soft, positive  normoactive bowel sounds.  EXTREMITIES:  No cyanosis or clubbing, trace lower extremity edema.  MUSCULOSKELETAL:  5/5 strength globally.  No calf tenderness  bilaterally.  No right thigh tenderness.  NEUROLOGIC:  Cranial nerves II through XII grossly intact.   INITIAL LABORATORIES:  Sodium 141, potassium 4.2, chloride of 107,  bicarb 27, BUN of 10, creatinine of 0.68, and glucose of 109.  White  blood cell count of 4.5, hemoglobin 13.1, and platelets 208.  Initial  cardiac enzymes were negative, upon repeat cardiac enzymes they were  negative x3.  Initial D-dimer 0.22.  EKG normal sinus rhythm with no ST  elevation/ depression, but there was T-wave inversion in V1 (new) and V2  (old). The patient received a nitroglycerin paste in the ED and this  provided relief of the chest pain.   HOSPITAL COURSE:  1. Chest pain with associated shortness of breath:  Given the patient      has risk factors for coronary artery disease including past tobacco      history, borderline dyslipidemia, borderline hypertension,      prediabetic, family history, there is a risk for a coronary      etiology to the chest pain.  The patient was in normal sinus rhythm      with only new TWI in V2 (old TWI in V1) on her presenting EKG that      was compared to a prior from March 2009.  The morning EKG was      similar to the presenting EKG with no changes.  Cardiac enzymes      were cycled x3 and were all negative.  The presenting signs were      atypical for  cardiac chest pain in that they were only borderline      related to activity, had been going on for 3 days, and had been      progressively improving.  The patient had a past echo that was  normal, and a reportedly negative Myoview.  However, given that the      patient does not have a recent documented Myoview, it was our      decision along with the consultant Dr. Patty Sermons to have the      patient undergo an outpatient Myoview and this has been scheduled      for her for June 06, 2008, at 11:30 a.m.  Given the patient has a      hiatal hernia and GERD, it is most likely that the patient's chest      pain is related to reflux.  She was provided with a prescription      for Protonix 40 mg b.i.d. as she had not been taking the previously      prescribed Prevacid.  She may require outpatient followup and      referral back to [facility] where she had been      previously seen by Dr. Christella Hartigan.  An EGD had been done in February      2008 that showed a 2- to 3-cm hiatal hernia without any other      pathology.  Other possible etiologies for the patient's chest pain      include musculoskeletal and pulmonary, however, the patient did not      have any tenderness to palpation in her precordium, nor did she      have any pathology on chest x-ray.  Her D-dimer was normal.  2. Right groin pain:  The patient complained of right groin pain that      started 3 days ago, but was absent upon admission.  She had a      recent right thigh MRI in June 2009 that was normal.  Since the      groin pain had completely resolved upon presentation, no further      workup was performed.  3. Dyslipidemia:  The patient's fasting lipid panel was repeated and      her LDL was 125 and HDL 33.  She may benefit from a fibrate that      can be added as an outpatient for her risk reduction for      cardiovascular disease.  4. Anxiety/depression:  The patient appeared anxious during her       hospitalization and this may be a component to her chest pain.  She      had been previously on Xanax 0.5 mg once daily p.r.n. for anxiety.      Given that she did not experience adequate relief at this dose, her      dosage was increased to 1 mg and she was provided with a      prescription to take it up to twice a day.  This should also be      followed up as an outpatient.  5. GERD/hiatal hernia:  The patient was put on Protonix as above      because this may be related to her chest pain.  She should have      followup as an outpatient and may benefit from going back to her      gastroenterologist, Dr. Christella Hartigan of [facility].   DISCHARGE LABORATORIES:  The patient's sodium 141, potassium 3.8,  chloride of 105, bicarbonate 26, glucose 93, BUN of 11, and creatinine  of 0.57.  Bilirubin 0.6, alk phos 60, AST 20, ALT 18, and calcium 9.2.  The patient also had a lipase of 22.   DISCHARGE  VITAL SIGNS:  Temperature 97.8, pulse 62, respiratory rate of  18, blood pressure 107/56, and O2 sats of 95% on room air.      Linward Foster, MD  Electronically Signed      Manning Charity, MD  Electronically Signed    LW/MEDQ  D:  06/02/2008  T:  06/03/2008  Job:  [id number]   cc:   Joaquin Courts, MD  Cassell Clement, M.D.

## 2010-10-16 NOTE — Discharge Summary (Signed)
NAMEDASIA, Michele [name]               ACCOUNT NO.:  [id number]   MEDICAL RECORD NO.:  [id number]          PATIENT TYPE:  WOC   LOCATION:  WOC                          FACILITY:  [facility]   PHYSICIAN:  Madaline Guthrie, M.D.    DATE OF BIRTH:  [birth year]   DATE OF ADMISSION:  08/05/2007  DATE OF DISCHARGE:  08/08/2007                               DISCHARGE SUMMARY   DISCHARGE DIAGNOSES:  1. Shortness of breath, unclear etiology.  2. Chest pain, non-cardiac.  3. Dyslipidemia, defined as low HDL.  4. Anxiety disorder, NOS.  5. Gastroesophageal reflux disease.  6. Chronic obstructive pulmonary disease.  7. Chronic leg pain.  8. Possible seizure disorder as a child, not on any medicines.   DISCHARGE MEDICATIONS:  1. Albuterol 90 mcg inhaler, 1 to 2 puffs q.4 to 6 hours p.r.n.  2. Calcium and vitamin D.  3. Alprazolam 0.25 mg p.o. daily.  4. Prilosec 40 mg p.o. daily.   PROCEDURE:  None.   CONSULTATIONS:  None.   DISPOSITION/FOLLOWUP:  The patient is to followup with Dr. Andrey Campanile, phone  number [phone], on August 10, 2007 at 3:15 p.m. The patient may benefit  from either repeat PFT's or possible contrasted CT scan of the chest, to  look for a subtle interstitial lung disease.   HISTORY OF PRESENT ILLNESS:  The patient is a 55 year old female with  past medical history of GERD, who recently quit smoking 2 months prior  to admission and history of COPD, presenting with left sided chest pain  that is atypical in nature. The patient also is complaining of  significant shortness of breath over the last 3 days prior to admission.  The patient denies cough, fevers, chills, leg pain or swelling. No  travel history or sick contacts.   PHYSICAL EXAMINATION:  VITAL SIGNS:  Temperature 97.5, blood pressure  157/80, pulse 87, respiratory rate 18, O2 saturation 96% on room air.  GENERAL:  She is alert and in no distress.  HEENT:  Eyes, nonicteric. Pupils are equal, round, and reactive to  light.  Extraocular motions intact. ENT examination, moist mucous  membranes.  NECK:  Supple. No JVD or lymphadenopathy.  RESPIRATORY:  Clear to auscultation bilaterally. No wheezing. Good air  entry bilaterally.  CARDIOVASCULAR:  Regular rate and rhythm. No murmur, rub, or gallop.  ABDOMEN:  Good bowel sounds. Soft, nontender, and nondistended.  EXTREMITIES:  No calf swelling or erythema. Homan's negative.  MUSCULOSKELETAL:  Strength 5/5 in all extremities.   LABORATORY DATA:  On admission B-met, sodium 139, potassium 4.1,  chloride 106, bicarb 30, BUN 10, creatinine 0.8, glucose 102. Liver  function studies, total bilirubin 0.5, alkaline phosphatase 65, AST 19,  ALT 21, total protein 6.4, albumin 3.7, calcium 9.1. CBC:  White count  6.8 with an absolute neutrophil count of 5.0. Hemoglobin 13.3, MCV 87.6,  platelet count 225,000. D-dimer 0.41. Point of care markers, CK MB less  than 1, troponin less than 0.05. Myoglobin 29.4. Urinalysis completely  within normal limits except for 100 of glucose.   DIAGNOSTIC IMAGING:  Chest x-ray showed bullous changes in the  right  upper lobe with no acute or superimposed abnormality.   EKG with normal sinus rhythm and no ischemic changes.   2-D echocardiogram, overall left ventricular systolic function was  normal with an ejection fraction around 60%. No left ventricular  regional wall motion abnormalities. There is mild mitral valve  regurgitation and the left atrium was mildly dilated.   HOSPITAL COURSE BY PROBLEM:  1. SHORTNESS OF BREATH:  It was unclear as to the etiology of the      patient's shortness of breath. The patient remained mildly short of      breath at the time of discharge and was assured that everything      serious was ruled out. The patient had cardiac enzymes cycled that      were all negative and repeat EKG was all negative. She was      monitored on telemetry and had no arhythmias.  A 2-D echocardiogram      showed a normal  ejection fraction and no evidence for ischemia. The      patient had a normal D-dimer. BNP was also normal at 34. A lipid      profile was checked and her LDL was 119, which is at goal for      somebody without any known history of coronary artery disease.  Her      only risk factor for a cardiac event would be for a history of      smoking and an HDL of 28. The patient also had a normal Myoview      within the last couple of years, so it was felt like cardiac event      would be much less likely. The patient was provided albuterol to      take in the outpatient setting and given a prescription for this.      The patient also had PFT's done recently that showed an FEV1 ratio      at 72, consistent with very mild chronic obstructive pulmonary      disease, so it was felt like an albuterol inhaler may assist her      with her symptoms. In the outpatient setting, the patient might      benefit from repeat PFT's or a contrasted CT scan of the chest to      look for possible interstitial lung disease that may be      contributing to her symptoms or perhaps a pulmonary embolus, that      given her normal D-dimer, it was felt that this was very, very      unlikely.  2. CHEST PAIN:  The patient's chest pain resolved while here.      Currently risk factor for a coronary event was smoking and a low      HDL and her blood pressure remained stable throughout. She has no      history of diabetes and her EKG and cardiac enzymes were all      normal. A 2-D echo was done and not indicative of any ischemic      changes. It was felt like this might be secondary to GERD.  3. DYSLIPIDEMIA:  The patient's total cholesterol was 177 with      triglycerides at 148, LDL at 119, all at goal, however, the      patient's LDL was low at 28 and may be considered for treatment      with a medication such as Niacin. This was  discussed with her as an      inpatient. She was not interested in taking any additional       medications.  4. ANXIETY DISORDER, NOS:  The patient would like refills of her      benzodiazepines but knows that she is not due for a refill for      several weeks, and so it was felt like this could be addressed in      the outpatient setting. The patient was offered treatment withSSRI      and she was not interested in taking a medication every day at this      time.   DISCHARGE VITAL SIGNS:  Temperature 98.1, blood pressure 118/61, pulse  67, respiratory rate 20. O2 sat 99% on room air.      Joaquin Courts, MD  Electronically Signed      Madaline Guthrie, M.D.  Electronically Signed    VW/MEDQ  D:  08/08/2007  T:  08/09/2007  Job:  [id number]

## 2010-10-19 NOTE — Assessment & Plan Note (Signed)
[facility]                         GASTROENTEROLOGY OFFICE NOTE   NAME:Henkels, Michele [name]                      MRN:          [id number]  DATE:06/10/2006                            DOB:          1955-11-23    REFERRING PHYSICIAN:  Dr. Allena Katz   Patient referred by Dr. Allena Katz at the [facility].   REASON FOR REFERRAL:  Dr. Allena Katz asked me to evaluate Michele [name]  regarding epigastric pain, question GERD.   HISTORY OF PRESENT ILLNESS:  Michele [name] is a very pleasant 55 year old  woman who has had 5 years of mid epigastric pain.  She describes the  pain as a burning pain.  It occurs 3-4 times a week, seems to be worse  at night.  She sometimes has an acid taste in her mouth.  Pain can last  anywhere from 10 minutes to half an hour or so.  She thought this was  her heart and was evaluated by cardiology in 2007, and that sounds to be  a negative workup.  She was told that she had a hiatal hernia in the  distant past.  She does not have any true esophageal dysphagia symptoms.  She takes very infrequent NSAIDs.  She has been recommended to take  Prevacid in the past, but she does not do this because she does not like  to take medicines on a daily basis.  She was more recently started on  omeprazole 20 mg twice daily, which she filled that prescription  yesterday.  She was referred to see me in September of 2007.  She no-  showed for her first appointment.  She rescheduled her second one and  now is here for evaluation.   REVIEW OF SYSTEMS:  Essentially normal and is available on nursing  intake sheet.   PAST MEDICAL HISTORY:  1. COPD.  2. Arthritis.  3. Panic disorder.  4. Depression.  5. Chronic headaches.  6. Tubal ligation 20 years ago.   CURRENT MEDICATIONS:  1. Neurontin.  2. Aspirin.  3. Omeprazole.   ALLERGIES:  TO SULFA ANTIBIOTICS.   SOCIAL HISTORY:  Divorced with 5 children.  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  Smokes pack of cigarettes a day.  Does not drink alcohol.  Drinks decaf coffee.  Has 1 soda daily.  Does  not lay down for bed too soon after eating her dinner meal.   FAMILY HISTORY:  No colon cancer or colon polyps in family.   PHYSICAL EXAMINATION:  Patient is 5 foot 5 inches, 176 pounds.  Blood  pressure 122/70, pulse 72.  CONSTITUTIONAL:  Generally well-appearing.  NEUROLOGIC:  Alert and oriented x3..  EYES:  Extraocular movements intact.  MOUTH:  Oropharynx moist, no lesions.  NECK:  Supple, no lymphadenopathy.  CARDIOVASCULAR:  Heart regular rate and rhythm.  LUNGS:  Clear to auscultation bilaterally.  ABDOMEN:  Soft and nontender, nondistended, normal bowel sounds.  EXTREMITIES:  No lower extremity edema.  SKIN:  No rashes or lesions on visible extremities.   ASSESSMENT AND PLAN:  A 55 year old  woman with likely gastroesophageal  reflux disease, question peptic ulcer disease, routine risk for  colorectal cancer.   She has had 5 years of what sounds like gastroesophageal reflux disease  symptoms, she is very reluctant to take medicines, but I do feel that a  proton pump inhibitor will probably help her symptoms quite well.  I  have therefore recommended she begin taking the omeprazole that she  filled yesterday.  I think probably once a day is fine at first, so she  will take a 20 mg pill 20-30 minutes prior to her dinner meal, which is  her most reliable meal of the day.  She does need an endoscopy to screen  her for Barrett's and other esophageal complications from long-standing  gastroesophageal reflux disease.  At the same time, we will arrange for  her to have a colonoscopy since she has not had colorectal cancer  screening.  That will be for routine risk of colorectal cancer.  I see  no reason for any further blood tests or imaging studies prior to then.     Rachael Fee, MD  Electronically Signed    DPJ/MedQ  DD: 06/10/2006  DT: 06/10/2006   Job #: [id number]   cc:   Dr. Allena Katz

## 2010-10-19 NOTE — Assessment & Plan Note (Signed)
[facility]                         GASTROENTEROLOGY OFFICE NOTE   NAME:Grawe, SHILPA BUSHEE                      MRN:          [id number]  DATE:08/20/2006                            DOB:          [birth year]    GI PROBLEM LIST:  1. Colon polyp February 2008.  Tubular adenoma.  Next colonoscopy      scheduled for February 2013.  2. Gastroesophageal reflux disease associated dyspepsia.  EGD February      2008 showed no esophagitis, but a 2 to 3 cm hiatal hernia.      Symptoms were burning pain, acid taste in mouth, usually worse at      night, also epigastric pain seems to be postprandial.   INTERVAL HISTORY:  I last saw Corrie Dandy at the time of her colonoscopy and  upper endoscopy about 6 weeks ago.  At that point, I recommended that  she be begin taking proton pump inhibitor twice daily to see if that  helps her symptoms.  She has not reliably done that.  She did, however,  smoking, and said when she stopped smoking her pains were completely  gone.  She restarted smoking a couple of weeks ago, and her pains  returned.   CURRENT MEDICATIONS:  Omeprazole 20 mg she takes on an as-needed basis  only.   PHYSICAL EXAMINATION:  Weight 178 pounds, up 2 pounds since her last  visit.  Blood pressure 120/70.  Pulse 88.  CONSTITUTIONAL:  Generally well appearing.  NEUROLOGIC:  Alert and oriented x3.  Eyes:  Extraocular movements intact.  ABDOMEN:  Soft, non-tender, and non-distended.  Normal bowel sounds.   ASSESSMENT AND PLAN:  A 55 year old woman with dyspepsia likely from  underlying gastroesophageal reflux disease.   First, she had a tubular adenoma removed from her colon last month, and  so we put her on our reminder system for repeat colonoscopy in 5 years.  Second, she is very reliably been taking the antacid medicine.  She  takes proton pump inhibitor on an as-needed basis, even though I have  recommended her to take it on a daily basis.  I reiterated that  this was  indeed best to be taken on a daily basis.  I called into her pharmacy, a  prescription for Zegerid with 11 refills.  Omeprazole was off formulary  for her.  I think it is interesting that when she stopped her smoking,  her abdominal pains completely resolved, and when she restarted smoking,  her pains returned.  Tobacco is a known relaxer of the lower esophageal  sphincter, and I think this is further evidence that she has  gastroesophageal reflux disease discomforts.  I have  recommended that for her abdominal pain, as well as for many other  health concerns, she should completely stop smoking and not restart.     Rachael Fee, MD  Electronically Signed    DPJ/MedQ  DD: 08/20/2006  DT: 08/20/2006  Job #: [id number]   cc:   Zetta Bills, MD

## 2010-10-19 NOTE — H&P (Signed)
NAMEMAYANNA, Michele [name]               ACCOUNT NO.:  [id number]   MEDICAL RECORD NO.:  [id number]          PATIENT TYPE:  INP   LOCATION:  0104                         FACILITY:  [facility]   PHYSICIAN:  Danae Chen, M.D.DATE OF BIRTH:  1955-07-06   DATE OF ADMISSION:  06/21/2005  DATE OF DISCHARGE:                                HISTORY & PHYSICAL   CHIEF COMPLAINT:  Chest pain.   PRIMARY CARE PHYSICIAN:  Located at [facility].   HISTORY OF PRESENT ILLNESS:  The patient is a 55 year old white female with  a long history of smoking who presents with a two-three day history of  intermittent chest discomfort.  She describes the discomfort as tightness  that has been radiating to her neck, jaw, and lower back.  The episodes of  pain lasted anywhere between one minute to two hours.  It is not associated  with any nausea and vomiting or diaphoresis.  Nothing makes it worse.  Nothing makes it better.  She has not experienced any pain like this before.  She does report she has been under a lot of stress recently.  No complaints  of nausea and vomiting or diarrhea.  No fevers, chills, or cough.  No recent  weight gain or weight loss.  No hematochezia.  No hematemesis.  No headache.  No change in vision.  She does complain of some dry mouth and some abdominal  discomfort.  Her appetite has been decreased.   PAST MEDICAL HISTORY:  Significant for:  1.  COPD.  2.  Hiatal hernia.  3.  Reflux disease.   PAST SURGICAL HISTORY:  No surgical history that she is aware of.   FAMILY HISTORY:  Grandmother with early onset heart disease.  Mother with  heart failure.  No siblings or other first degree relatives with heart  attacks before age 30 that she is aware of.   SOCIAL HISTORY:  She takes care of her mother.  She is not working.  She has  grown children.  She smokes about one pack/day.  No alcohol consumption.   REVIEW OF SYSTEMS:  As per the HPI.   MEDICATIONS:  Prevacid once  daily.   ALLERGIES:  SULFA.   PHYSICAL EXAMINATION:  VITAL SIGNS:  Blood pressure is 108/51, pulse of 58  and regular, O2 saturation is 98% on room air, temperature is 98.4.  GENERAL:  She is in no acute distress.  HEENT:  Oropharynx is clear.  Pupils are equal and reactive.  NECK:  Supple.  HEART:  Rate is regular with normal S1-S2.  LUNGS:  Clear to auscultation bilaterally.  She has no costovertebral angle  tenderness.  No chest wall tenderness to palpation.  EXTREMITIES:  2+ femoral pulses.  2+ dorsalis pedis pulses.  No peripheral  edema.  NEUROLOGIC:  Non-focal exam.   PERTINENT LABORATORIES:  Initial CK-MBs were negative with troponin negative  x2, less than 0.05.  D-dimer is 0.24.  Sodium 139, potassium 3.6, CO2 of 25,  glucose of 110, BUN 9, creatinine 0.7.  A chest x-ray showing no active  disease.  Right  upper lobe bullae.  EKG normal sinus rhythm with some sinus  arrhythmia.  No ST segment elevation or depression.   IMPRESSION:  A 55 year old white female with atypical chest discomfort.  She  is a known smoker.  Her cardiac risk factors include tobacco but no diabetes  and no hypertension.  She is not aware of her cholesterol level.  No  significant family history.  At this time we will admit her to rule out  cardiac ischemia.  Her chest discomfort may be related to a hiatal hernia  and exacerbation of her reflux disease.  Continue with her reflux  medication, gastrointestinal prophylaxis, as well as Lovenox for deep venous  thrombosis prophylaxis.  Obtain serial cardiac enzymes.  Repeat an  electrocardiogram.  Place her on a low dose beta blocker and aspirin.  We  will check a fasting lipid panel as well.  If the patient rules out and she  is pain-free, then we can consider discharging her and having an outpatient  followup appointment for risk stratification, which can include either a  treadmill test or Cardiolite.      Danae Chen, M.D.  Electronically  Signed     RLK/MEDQ  D:  06/22/2005  T:  06/22/2005  Job:  [id number]

## 2010-10-23 ENCOUNTER — Encounter: Payer: Self-pay | Admitting: Internal Medicine

## 2010-10-31 ENCOUNTER — Encounter: Payer: Medicare Other | Admitting: Obstetrics and Gynecology

## 2010-11-06 ENCOUNTER — Ambulatory Visit: Payer: Medicare Other | Admitting: Gastroenterology

## 2010-11-16 ENCOUNTER — Other Ambulatory Visit: Payer: Self-pay | Admitting: Advanced Practice Midwife

## 2010-11-16 ENCOUNTER — Encounter: Payer: Medicare Other | Admitting: Advanced Practice Midwife

## 2010-11-16 DIAGNOSIS — R1084 Generalized abdominal pain: Secondary | ICD-10-CM

## 2010-11-16 DIAGNOSIS — Z124 Encounter for screening for malignant neoplasm of cervix: Secondary | ICD-10-CM

## 2010-11-17 NOTE — Group Therapy Note (Unsigned)
NAME:  Michele [name], Michele [name]               ACCOUNT NO.:  [id number]  MEDICAL RECORD NO.:  [id number]           PATIENT TYPE:  A  LOCATION:  [facility]                   FACILITY:  [facility]  PHYSICIAN:  Wynelle Bourgeois, CNM    DATE OF BIRTH:  [birth year]  DATE OF SERVICE:  11/16/2010                                 CLINIC NOTE  This is a 55 year old white female, who is a gravida 5, para 5, who is postmenopausal, who presents as a referral from [facility] for right lower quadrant pain, in fact her pain is actually in the right mid abdomen and not in the right lower quadrant.  She has had this pain for some time.  She was recently seen in MAU once and the emergency room, both in April for this same pain.  She had a pelvic ultrasound and CT, which were both negative for any pathology.  Lab tests were all normal with a normal white count and normal urinalysis.  She has an appointment coming up with Dr. Christella Hartigan in GI for a repeat GI consult and colonoscopy. She does have a history of intestinal polyps and hemorrhoids.  She states that she had a constipated bowel movement yesterday and had a lot of rectal bleeding afterwards.  She denies any fever, nausea, vomiting. Does report constipation.  OB/GYN history is remarkable for age of onset of period at 55 years old with no abnormalities.  She stopped having periods about 2 or 3 years ago.  Last Pap was 2 years ago and was normal.  She does not use contraception, of course, she had an one abnormal Pap 10 years ago, treated with LEEP.  MEDICAL HISTORY:  Remarkable for arthritis, epilepsy, stomach ulcers, depression, anxiety, and emphysema.  SOCIAL HISTORY:  The patient smokes two packs per day for 13 years.  She denies any other substance abuse.  FAMILY HISTORY:  Remarkable for diabetes, heart disease, high blood pressure, and blood clots.  PHYSICAL EXAMINATION:  VITAL SIGNS:  Temperature 97.1, pulse 75, blood pressure 123/72, weight 149.1, height  65.5 inches. ABDOMEN:  Soft and nontender, except for some mild tenderness in the right mid quadrant towards the flank.  She has no masses appreciated in her abdomen. EG:  BUS within normal limits except for some atrophy.  Vagina clear with small amount of atrophy.  Cervix is closed and long.  Pap smears obtained.  Uterus is small and nontender.  Adnexa are nontender and difficult to palpate secondary to habitus.  There is no cervical motion tenderness.  ASSESSMENT: 1. Right mid quadrant pain. 2. Normal GYN exam. 3. History of intestinal polyps and ulcers. 4. Rectal bleeding probably secondary to hemorrhoids. 5. Two pack a day smoker. 6. Depression and anxiety.  PLAN: 1. Pap sent. 2. Advised to keep appointment with [facility] for further evaluation     of the abdominal pain and rectal bleeding. 3. Recommendations given for MiraLax and fiber therapy for     constipation. 4. RX for Prozac given for complaints of low sex drive and depression.     The patient is hesitant to take this as she is taking clonazepam  for anxiety and I advised her to keep the prescription and discuss     that with her primary care physician and way out the pros and cons     of using the Prozac. 5. Smoking cessation encouraged with tips given.          ______________________________ Wynelle Bourgeois, CNM    MW/MEDQ  D:  11/16/2010  T:  11/17/2010  Job:  [id number]

## 2010-11-27 ENCOUNTER — Encounter: Payer: Self-pay | Admitting: Gastroenterology

## 2010-11-27 ENCOUNTER — Ambulatory Visit (INDEPENDENT_AMBULATORY_CARE_PROVIDER_SITE_OTHER): Payer: Medicare Other | Admitting: Gastroenterology

## 2010-11-27 VITALS — BP 132/74 | HR 84 | Ht 65.0 in | Wt 150.0 lb

## 2010-11-27 DIAGNOSIS — R1031 Right lower quadrant pain: Secondary | ICD-10-CM

## 2010-11-27 MED ORDER — PEG-KCL-NACL-NASULF-NA ASC-C 100 G PO SOLR: 1.0000 | ORAL | Status: DC
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
## 2010-11-27 NOTE — Patient Instructions (Addendum)
One of your biggest health concerns is your smoking.  This increases your risk for most cancers and serious cardiovascular diseases such as strokes, heart attacks.  You should try your best to stop.  If you need assistence, please contact your PCP or Smoking Cessation Class at [facility] [phone]) or [facility] Quit-Line (1-800-QUIT-NOW). You will be set up for a colonoscopy. A copy of this information will be made available to Dr. Felicity Coyer.

## 2010-11-30 ENCOUNTER — Ambulatory Visit (AMBULATORY_SURGERY_CENTER): Payer: Medicare Other | Admitting: Gastroenterology

## 2010-11-30 ENCOUNTER — Telehealth: Payer: Self-pay

## 2010-11-30 ENCOUNTER — Encounter: Payer: Self-pay | Admitting: Gastroenterology

## 2010-11-30 VITALS — BP 112/68 | HR 62 | Temp 97.6°F | Resp 16 | Ht 65.0 in | Wt 147.0 lb

## 2010-11-30 DIAGNOSIS — D126 Benign neoplasm of colon, unspecified: Secondary | ICD-10-CM

## 2010-11-30 DIAGNOSIS — Z86010 Personal history of colonic polyps: Secondary | ICD-10-CM

## 2010-11-30 DIAGNOSIS — K635 Polyp of colon: Secondary | ICD-10-CM

## 2010-11-30 DIAGNOSIS — R109 Unspecified abdominal pain: Secondary | ICD-10-CM

## 2010-11-30 DIAGNOSIS — R1031 Right lower quadrant pain: Secondary | ICD-10-CM

## 2010-11-30 DIAGNOSIS — K625 Hemorrhage of anus and rectum: Secondary | ICD-10-CM

## 2010-11-30 DIAGNOSIS — K649 Unspecified hemorrhoids: Secondary | ICD-10-CM

## 2010-11-30 MED ORDER — HYOSCYAMINE SULFATE ER 0.375 MG PO TB12: 375.0000 ug | ORAL_TABLET | Freq: Two times a day (BID) | ORAL | Status: DC | PRN

## 2010-11-30 MED ORDER — GLYCOPYRROLATE 2 MG PO TABS: 2.0000 mg | ORAL_TABLET | Freq: Two times a day (BID) | ORAL | Status: DC

## 2010-11-30 NOTE — Progress Notes (Signed)
#   22 attempted in right a/c brisk blood return.  Swelling at site when flushing. No c/o pain. No bruising at site. D/ced 22 and  Pressure dressing applied.  Attempted #24 in right arm.  Pt c/o pain and moaning. Removed angiocath and there was no bruising, discharge, bleeding from site. Can not see where pt was stuck.  Pressure dressing applied.

## 2010-11-30 NOTE — Patient Instructions (Addendum)
One of your biggest health concerns is your smoking.  This increases your risk for most cancers and serious cardiovascular diseases such as strokes, heart attacks.  You should try your best to stop.  If you need assistence, please contact your PCP or Smoking Cessation Class at [facility] [phone]) or [facility] Quit-Line (1-800-QUIT-NOW).  Follow discharge instructions.  Continue your medications.Pick up your new prescription.  Next Colonoscopy in 5 years.

## 2010-11-30 NOTE — Telephone Encounter (Signed)
rx sent

## 2010-11-30 NOTE — Telephone Encounter (Signed)
Dr Christella Hartigan can we try Robinul for this pt?  The hyoscyamine is not covered by her insurance.

## 2010-11-30 NOTE — Telephone Encounter (Signed)
Yes, robinul 2mg  po bid, disp 60, 2 refills

## 2010-12-03 ENCOUNTER — Telehealth: Payer: Self-pay | Admitting: *Deleted

## 2010-12-03 NOTE — Telephone Encounter (Signed)
No answer/no identifier. No message left.

## 2010-12-14 ENCOUNTER — Encounter: Payer: Self-pay | Admitting: Internal Medicine

## 2011-01-25 ENCOUNTER — Inpatient Hospital Stay (INDEPENDENT_AMBULATORY_CARE_PROVIDER_SITE_OTHER)
Admission: RE | Admit: 2011-01-25 | Discharge: 2011-01-25 | Disposition: A | Discharge status: Home / Self Care | Payer: Medicare Other | Source: Ambulatory Visit | Attending: Family Medicine | Admitting: Family Medicine

## 2011-01-25 ENCOUNTER — Ambulatory Visit (INDEPENDENT_AMBULATORY_CARE_PROVIDER_SITE_OTHER): Payer: Medicare Other

## 2011-01-25 DIAGNOSIS — S93409A Sprain of unspecified ligament of unspecified ankle, initial encounter: Secondary | ICD-10-CM

## 2011-02-08 ENCOUNTER — Other Ambulatory Visit (INDEPENDENT_AMBULATORY_CARE_PROVIDER_SITE_OTHER): Payer: Medicare Other

## 2011-02-08 ENCOUNTER — Encounter: Payer: Self-pay | Admitting: Internal Medicine

## 2011-02-08 ENCOUNTER — Ambulatory Visit (INDEPENDENT_AMBULATORY_CARE_PROVIDER_SITE_OTHER): Payer: Medicare Other | Admitting: Internal Medicine

## 2011-02-08 ENCOUNTER — Telehealth: Payer: Self-pay | Admitting: *Deleted

## 2011-02-08 VITALS — BP 100/60 | HR 75 | Temp 98.2°F | Ht 65.0 in | Wt 148.8 lb

## 2011-02-08 DIAGNOSIS — F411 Generalized anxiety disorder: Secondary | ICD-10-CM

## 2011-02-08 DIAGNOSIS — R5383 Other fatigue: Secondary | ICD-10-CM

## 2011-02-08 DIAGNOSIS — R5381 Other malaise: Secondary | ICD-10-CM

## 2011-02-08 DIAGNOSIS — R7309 Other abnormal glucose: Secondary | ICD-10-CM

## 2011-02-08 DIAGNOSIS — E559 Vitamin D deficiency, unspecified: Secondary | ICD-10-CM

## 2011-02-08 DIAGNOSIS — T148XXA Other injury of unspecified body region, initial encounter: Secondary | ICD-10-CM

## 2011-02-08 DIAGNOSIS — R739 Hyperglycemia, unspecified: Secondary | ICD-10-CM

## 2011-02-08 DIAGNOSIS — F419 Anxiety disorder, unspecified: Secondary | ICD-10-CM

## 2011-02-08 DIAGNOSIS — W57XXXA Bitten or stung by nonvenomous insect and other nonvenomous arthropods, initial encounter: Secondary | ICD-10-CM

## 2011-02-08 DIAGNOSIS — F17200 Nicotine dependence, unspecified, uncomplicated: Secondary | ICD-10-CM

## 2011-02-08 DIAGNOSIS — T148 Other injury of unspecified body region: Secondary | ICD-10-CM

## 2011-02-08 LAB — IBC PANEL
Iron: 78 ug/dL (ref 42–145)
Saturation Ratios: 24.3 % (ref 20.0–50.0)
Transferrin: 229.2 mg/dL (ref 212.0–360.0)

## 2011-02-08 LAB — TSH: TSH: 1.13 u[IU]/mL (ref 0.35–5.50)

## 2011-02-08 LAB — VITAMIN B12: Vitamin B-12: >1500 pg/mL — ABNORMAL HIGH (ref 211–911)

## 2011-02-08 LAB — HEMOGLOBIN A1C: Hgb A1c MFr Bld: 5.9 % (ref 4.6–6.5)

## 2011-02-08 MED ORDER — TRIAMCINOLONE ACETONIDE 0.1 % EX OINT: TOPICAL_OINTMENT | Freq: Two times a day (BID) | CUTANEOUS | Status: DC | PRN

## 2011-02-08 MED ORDER — CLONAZEPAM 1 MG PO TABS: 1.0000 mg | ORAL_TABLET | Freq: Three times a day (TID) | ORAL | Status: DC | PRN

## 2011-02-08 NOTE — Assessment & Plan Note (Signed)
Significant component of anxiety evident on exam today -  discussed med options available to use with scheduled BZ and pt declines same- Reports change from xanax to klonopin 06/2010 to help her anxiety control - refill same today Ok to continue same and encourage consideration of behav health counseling to augment tx of same Never started prozac as rx'd 10/2010

## 2011-02-08 NOTE — Telephone Encounter (Signed)
Called pt no answer left msg on cell md response, also labs normal also....02/08/11@4 :54pm/LMB

## 2011-02-08 NOTE — Telephone Encounter (Signed)
As we discussed at OV, If she develops worsening symptoms such as fever we can reconsider antibiotics, but it does not appear necessary to use antibiotics at this time. Can call or return to Saturday clinic in AM if worse overnight - thanks

## 2011-02-08 NOTE — Patient Instructions (Signed)
It was good to see you today. Test(s) ordered today. Your results will be called to you after review (48-72hours after test completion). If any changes need to be made, you will be notified at that time. Refill on clonazepam and use steroid cream for neck itch from bites Your prescription(s) have been submitted to your pharmacy. Please take as directed and contact our office if you believe you are having problem(s) with the medication(s). Continue to work on giving up those cigarettes Please schedule followup in 4 months, call sooner if problems.
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
## 2011-02-08 NOTE — Telephone Encounter (Signed)
Pt call left msg stating that her glands are now swollen, and is hurting. Pt is wanting to know can md send antibiotic to her pharmacy. Pls advise.....02/08/11@2 :55pm/LMB

## 2011-02-09 LAB — VITAMIN D 25 HYDROXY (VIT D DEFICIENCY, FRACTURES): Vit D, 25-Hydroxy: 50 ng/mL (ref 30–89)
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
## 2011-02-24 IMAGING — CR DG CHEST 1V PORT
1 series · 1 of 1 positions shown · non-contrast
Comparison: Chest 06/01/2008.

CLINICAL DATA: Chest pain and hypertension.

PORTABLE CHEST - 1 VIEW

[view not recorded]
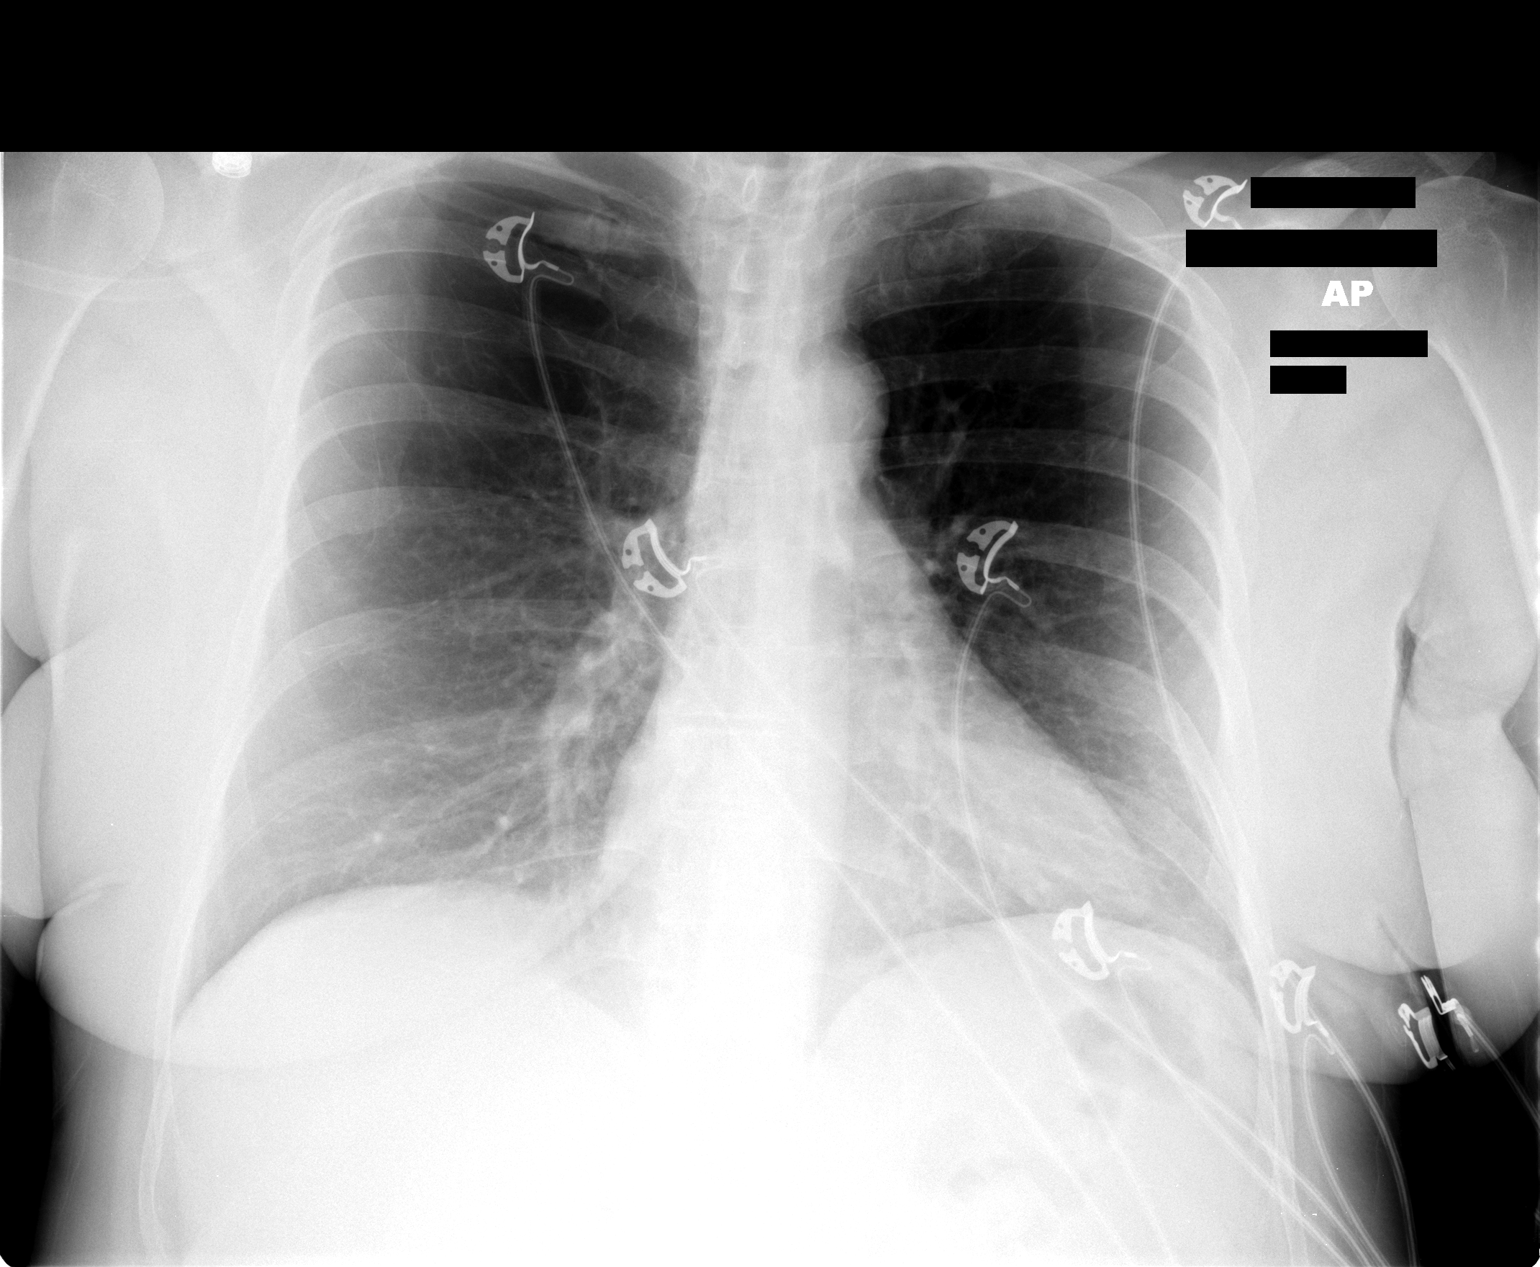

[1 of 1 positions shown; findings below may reference images not displayed]

FINDINGS: There is bolus emphysema but the lungs are clear.  No
effusion.  Heart size normal.
IMPRESSION: Bullous emphysema without acute disease.

## 2011-02-25 LAB — COMPREHENSIVE METABOLIC PANEL
ALT: 21
ALT: 21
AST: 19
AST: 19
Albumin: 3.7
Albumin: 3.7
Alkaline Phosphatase: 65
Alkaline Phosphatase: 65
BUN: 11
BUN: 9
CO2: 28
CO2: 29
Calcium: 9.1
Calcium: 9.2
Chloride: 105
Chloride: 106
Creatinine, Ser: 0.7
Creatinine, Ser: 0.71
GFR calc Af Amer: >60
GFR calc Af Amer: >60
GFR calc non Af Amer: >60
GFR calc non Af Amer: >60
Glucose, Bld: 148 — ABNORMAL HIGH
Glucose, Bld: 79
Potassium: 3.7
Potassium: 3.7
Sodium: 141
Sodium: 141
Total Bilirubin: 0.4
Total Bilirubin: 0.5
Total Protein: 6.4
Total Protein: 6.5

## 2011-02-25 LAB — BASIC METABOLIC PANEL
BUN: 10
CO2: 27
Calcium: 9
Chloride: 105
Creatinine, Ser: 0.71
GFR calc Af Amer: >60
GFR calc non Af Amer: >60
Glucose, Bld: 134 — ABNORMAL HIGH
Potassium: 3.6
Sodium: 139

## 2011-02-25 LAB — POCT CARDIAC MARKERS
CKMB, poc: <1 — ABNORMAL LOW
CKMB, poc: <1 — ABNORMAL LOW
Myoglobin, poc: 14.2
Myoglobin, poc: 29.4
Operator id: 146091
Operator id: 234501
Troponin i, poc: <0.05
Troponin i, poc: <0.05

## 2011-02-25 LAB — URINALYSIS, ROUTINE W REFLEX MICROSCOPIC
Bilirubin Urine: NEGATIVE
Glucose, UA: 100 — AB
Hgb urine dipstick: NEGATIVE
Ketones, ur: NEGATIVE
Nitrite: NEGATIVE
Protein, ur: NEGATIVE
Specific Gravity, Urine: 1.024
Urobilinogen, UA: 0.2
pH: 5

## 2011-02-25 LAB — B-NATRIURETIC PEPTIDE (CONVERTED LAB)
Pro B Natriuretic peptide (BNP): 30
Pro B Natriuretic peptide (BNP): 34

## 2011-02-25 LAB — LIPID PANEL
Cholesterol: 177
HDL: 28 — ABNORMAL LOW
LDL Cholesterol: 119 — ABNORMAL HIGH
Total CHOL/HDL Ratio: 6.3
Triglycerides: 148
VLDL: 30

## 2011-02-25 LAB — DIFFERENTIAL
Basophils Absolute: 0
Basophils Relative: 0
Eosinophils Absolute: 0.1
Eosinophils Relative: 2
Lymphocytes Relative: 19
Lymphs Abs: 1.3
Monocytes Absolute: 0.4
Monocytes Relative: 6
Neutro Abs: 5
Neutrophils Relative %: 74

## 2011-02-25 LAB — I-STAT 8, (EC8 V) (CONVERTED LAB)
BUN: 10
Bicarbonate: 28.1 — ABNORMAL HIGH
Chloride: 106
Glucose, Bld: 102 — ABNORMAL HIGH
HCT: 41
Hemoglobin: 13.9
Operator id: 146091
Potassium: 4.1
Sodium: 139
TCO2: 30
pCO2, Ven: 57.9 — ABNORMAL HIGH
pH, Ven: 7.294

## 2011-02-25 LAB — POCT URINALYSIS DIP (DEVICE)
Bilirubin Urine: NEGATIVE
Glucose, UA: 500 — AB
Hgb urine dipstick: NEGATIVE
Ketones, ur: NEGATIVE
Nitrite: NEGATIVE
Operator id: 116391
Protein, ur: NEGATIVE
Specific Gravity, Urine: 1.025
Urobilinogen, UA: 0.2
pH: 5

## 2011-02-25 LAB — TROPONIN I
Troponin I: 0.01
Troponin I: <0.01

## 2011-02-25 LAB — CBC
HCT: 38.9
Hemoglobin: 13.3
MCHC: 34.2
MCV: 87.6
Platelets: 225
RBC: 4.45
RDW: 12.9
WBC: 6.8

## 2011-02-25 LAB — MAGNESIUM
Magnesium: 2
Magnesium: 2.3

## 2011-02-25 LAB — CK TOTAL AND CKMB (NOT AT ARMC)
CK, MB: 0.6
CK, MB: 0.7
Relative Index: INVALID
Relative Index: INVALID
Total CK: 22
Total CK: 22

## 2011-02-25 LAB — D-DIMER, QUANTITATIVE: D-Dimer, Quant: 0.41

## 2011-02-25 LAB — CARDIAC PANEL(CRET KIN+CKTOT+MB+TROPI)
CK, MB: 0.5
Relative Index: INVALID
Total CK: 17
Troponin I: 0.01

## 2011-02-25 LAB — POCT I-STAT CREATININE
Creatinine, Ser: 0.8
Operator id: 146091

## 2011-02-26 LAB — CBC
HCT: 40.8
Hemoglobin: 13.8
MCHC: 33.7
MCV: 87.9
Platelets: 234
RBC: 4.64
RDW: 12.9
WBC: 6.3

## 2011-02-26 LAB — URINALYSIS, ROUTINE W REFLEX MICROSCOPIC
Bilirubin Urine: NEGATIVE
Glucose, UA: NEGATIVE
Hgb urine dipstick: NEGATIVE
Ketones, ur: NEGATIVE
Nitrite: NEGATIVE
Protein, ur: NEGATIVE
Specific Gravity, Urine: 1.004 — ABNORMAL LOW
Urobilinogen, UA: 0.2
pH: 6

## 2011-02-26 LAB — DIFFERENTIAL
Basophils Absolute: 0
Basophils Relative: 0
Eosinophils Absolute: 0.1
Eosinophils Relative: 1
Lymphocytes Relative: 20
Lymphs Abs: 1.2
Monocytes Absolute: 0.4
Monocytes Relative: 6
Neutro Abs: 4.6
Neutrophils Relative %: 73

## 2011-02-26 LAB — POCT I-STAT, CHEM 8
BUN: 14
BUN: 15
Calcium, Ion: 1.22
Calcium, Ion: 1.26
Chloride: 104
Chloride: 105
Creatinine, Ser: 0.9
Creatinine, Ser: 1.2
Glucose, Bld: 141 — ABNORMAL HIGH
Glucose, Bld: 142 — ABNORMAL HIGH
HCT: 41
HCT: 42
Hemoglobin: 13.9
Hemoglobin: 14.3
Potassium: 4.2
Potassium: 4.2
Sodium: 140
Sodium: 142
TCO2: 27
TCO2: 28

## 2011-02-28 ENCOUNTER — Other Ambulatory Visit: Payer: Self-pay | Admitting: Internal Medicine

## 2011-02-28 DIAGNOSIS — Z1231 Encounter for screening mammogram for malignant neoplasm of breast: Secondary | ICD-10-CM

## 2011-03-07 LAB — URINE MICROSCOPIC-ADD ON

## 2011-03-07 LAB — POCT CARDIAC MARKERS
CKMB, poc: <1 ng/mL — ABNORMAL LOW (ref 1.0–8.0)
CKMB, poc: <1 ng/mL — ABNORMAL LOW (ref 1.0–8.0)
CKMB, poc: <1 ng/mL — ABNORMAL LOW (ref 1.0–8.0)
Myoglobin, poc: 44.3 ng/mL (ref 12–200)
Myoglobin, poc: 53.7 ng/mL (ref 12–200)
Myoglobin, poc: 64.4 ng/mL (ref 12–200)
Troponin i, poc: <0.05 ng/mL (ref 0.00–0.09)
Troponin i, poc: <0.05 ng/mL (ref 0.00–0.09)
Troponin i, poc: <0.05 ng/mL (ref 0.00–0.09)

## 2011-03-07 LAB — COMPREHENSIVE METABOLIC PANEL
ALT: 18 U/L (ref 0–35)
AST: 20 U/L (ref 0–37)
Albumin: 3.5 g/dL (ref 3.5–5.2)
Alkaline Phosphatase: 60 U/L (ref 39–117)
BUN: 11 mg/dL (ref 6–23)
CO2: 26 mEq/L (ref 19–32)
Calcium: 9.2 mg/dL (ref 8.4–10.5)
Chloride: 105 mEq/L (ref 96–112)
Creatinine, Ser: 0.57 mg/dL (ref 0.4–1.2)
GFR calc Af Amer: >60 mL/min (ref >60)
GFR calc non Af Amer: >60 mL/min (ref >60)
Glucose, Bld: 93 mg/dL (ref 70–99)
Potassium: 3.8 mEq/L (ref 3.5–5.1)
Sodium: 141 mEq/L (ref 135–145)
Total Bilirubin: 0.6 mg/dL (ref 0.3–1.2)
Total Protein: 6 g/dL (ref 6.0–8.3)

## 2011-03-07 LAB — URINALYSIS, ROUTINE W REFLEX MICROSCOPIC
Bilirubin Urine: NEGATIVE
Glucose, UA: NEGATIVE mg/dL
Hgb urine dipstick: NEGATIVE
Ketones, ur: NEGATIVE mg/dL
Nitrite: NEGATIVE
Protein, ur: NEGATIVE mg/dL
Specific Gravity, Urine: 1.019 (ref 1.005–1.030)
Urobilinogen, UA: 0.2 mg/dL (ref 0.0–1.0)
pH: 5 (ref 5.0–8.0)

## 2011-03-07 LAB — DIFFERENTIAL
Basophils Absolute: 0 10*3/uL (ref 0.0–0.1)
Basophils Relative: 0 % (ref 0–1)
Eosinophils Absolute: 0 10*3/uL (ref 0.0–0.7)
Eosinophils Relative: 1 % (ref 0–5)
Lymphocytes Relative: 23 % (ref 12–46)
Lymphs Abs: 1 10*3/uL (ref 0.7–4.0)
Monocytes Absolute: 0.2 10*3/uL (ref 0.1–1.0)
Monocytes Relative: 6 % (ref 3–12)
Neutro Abs: 3.2 10*3/uL (ref 1.7–7.7)
Neutrophils Relative %: 70 % (ref 43–77)

## 2011-03-07 LAB — CARDIAC PANEL(CRET KIN+CKTOT+MB+TROPI)
CK, MB: 0.6 ng/mL (ref 0.3–4.0)
CK, MB: 0.6 ng/mL (ref 0.3–4.0)
Relative Index: INVALID (ref 0.0–2.5)
Relative Index: INVALID (ref 0.0–2.5)
Total CK: 24 U/L (ref 7–177)
Total CK: 27 U/L (ref 7–177)
Troponin I: 0.01 ng/mL (ref 0.00–0.06)
Troponin I: <0.01 ng/mL (ref 0.00–0.06)

## 2011-03-07 LAB — BASIC METABOLIC PANEL
BUN: 10 mg/dL (ref 6–23)
CO2: 27 mEq/L (ref 19–32)
Calcium: 9.4 mg/dL (ref 8.4–10.5)
Chloride: 107 mEq/L (ref 96–112)
Creatinine, Ser: 0.68 mg/dL (ref 0.4–1.2)
GFR calc Af Amer: >60 mL/min (ref >60)
GFR calc non Af Amer: >60 mL/min (ref >60)
Glucose, Bld: 109 mg/dL — ABNORMAL HIGH (ref 70–99)
Potassium: 4.2 mEq/L (ref 3.5–5.1)
Sodium: 141 mEq/L (ref 135–145)

## 2011-03-07 LAB — CK TOTAL AND CKMB (NOT AT ARMC)
CK, MB: 0.7 ng/mL (ref 0.3–4.0)
Relative Index: INVALID (ref 0.0–2.5)
Total CK: 22 U/L (ref 7–177)

## 2011-03-07 LAB — LIPID PANEL
Cholesterol: 175 mg/dL (ref 0–200)
HDL: 33 mg/dL — ABNORMAL LOW (ref >39)
LDL Cholesterol: 125 mg/dL — ABNORMAL HIGH (ref 0–99)
Total CHOL/HDL Ratio: 5.3 RATIO
Triglycerides: 86 mg/dL (ref <150)
VLDL: 17 mg/dL (ref 0–40)

## 2011-03-07 LAB — CBC
HCT: 38.8 % (ref 36.0–46.0)
Hemoglobin: 13.1 g/dL (ref 12.0–15.0)
MCHC: 33.7 g/dL (ref 30.0–36.0)
MCV: 87.5 fL (ref 78.0–100.0)
Platelets: 208 10*3/uL (ref 150–400)
RBC: 4.44 MIL/uL (ref 3.87–5.11)
RDW: 13.1 % (ref 11.5–15.5)
WBC: 4.5 10*3/uL (ref 4.0–10.5)

## 2011-03-07 LAB — D-DIMER, QUANTITATIVE: D-Dimer, Quant: 0.22 ug/mL-FEU (ref 0.00–0.48)

## 2011-03-07 LAB — PROTIME-INR
INR: 1 (ref 0.00–1.49)
Prothrombin Time: 13.9 seconds (ref 11.6–15.2)

## 2011-03-07 LAB — LIPASE, BLOOD: Lipase: 22 U/L (ref 11–59)

## 2011-03-07 LAB — TROPONIN I: Troponin I: <0.01 ng/mL (ref 0.00–0.06)

## 2011-03-07 LAB — TSH: TSH: 1.621 u[IU]/mL (ref 0.350–4.500)

## 2011-03-08 ENCOUNTER — Ambulatory Visit: Payer: Medicare Other | Admitting: Internal Medicine

## 2011-03-12 LAB — COMPREHENSIVE METABOLIC PANEL
ALT: 17
AST: 15
Albumin: 3.8
Alkaline Phosphatase: 66
BUN: 12
CO2: 24
Calcium: 9.2
Chloride: 104
Creatinine, Ser: 0.65
GFR calc Af Amer: >60
GFR calc non Af Amer: >60
Glucose, Bld: 101 — ABNORMAL HIGH
Potassium: 3.7
Sodium: 136
Total Bilirubin: 0.5
Total Protein: 6.3

## 2011-03-12 LAB — DIFFERENTIAL
Basophils Absolute: 0
Basophils Relative: 1
Eosinophils Absolute: 0.1
Eosinophils Relative: 2
Lymphocytes Relative: 30
Lymphs Abs: 2.1
Monocytes Absolute: 0.4
Monocytes Relative: 6
Neutro Abs: 4.2
Neutrophils Relative %: 61

## 2011-03-12 LAB — URINALYSIS, ROUTINE W REFLEX MICROSCOPIC
Bilirubin Urine: NEGATIVE
Glucose, UA: NEGATIVE
Hgb urine dipstick: NEGATIVE
Ketones, ur: NEGATIVE
Nitrite: NEGATIVE
Protein, ur: NEGATIVE
Specific Gravity, Urine: 1.031 — ABNORMAL HIGH
Urobilinogen, UA: 0.2
pH: 5

## 2011-03-12 LAB — CBC
HCT: 38.6
Hemoglobin: 13.4
MCHC: 34.7
MCV: 85
Platelets: 220
RBC: 4.54
RDW: 13.4
WBC: 6.8

## 2011-03-12 LAB — POCT CARDIAC MARKERS
CKMB, poc: <1 — ABNORMAL LOW
Myoglobin, poc: 52.1
Operator id: 4761
Troponin i, poc: <0.05

## 2011-03-12 LAB — LIPASE, BLOOD: Lipase: 23

## 2011-03-18 ENCOUNTER — Ambulatory Visit: Payer: Medicare Other

## 2011-04-04 ENCOUNTER — Ambulatory Visit
Admission: RE | Admit: 2011-04-04 | Discharge: 2011-04-04 | Disposition: A | Discharge status: Home / Self Care | Payer: Medicare Other | Source: Ambulatory Visit | Attending: Internal Medicine | Admitting: Internal Medicine

## 2011-04-04 DIAGNOSIS — Z1231 Encounter for screening mammogram for malignant neoplasm of breast: Secondary | ICD-10-CM

## 2011-05-04 ENCOUNTER — Emergency Department (INDEPENDENT_AMBULATORY_CARE_PROVIDER_SITE_OTHER): Payer: Medicare Other

## 2011-05-04 ENCOUNTER — Emergency Department (INDEPENDENT_AMBULATORY_CARE_PROVIDER_SITE_OTHER)
Admission: EM | Admit: 2011-05-04 | Discharge: 2011-05-04 | Disposition: A | Discharge status: Home / Self Care | Payer: Medicare Other | Source: Home / Self Care | Attending: Family Medicine | Admitting: Family Medicine

## 2011-05-04 ENCOUNTER — Encounter (HOSPITAL_COMMUNITY): Payer: Self-pay | Admitting: Emergency Medicine

## 2011-05-04 DIAGNOSIS — J441 Chronic obstructive pulmonary disease with (acute) exacerbation: Secondary | ICD-10-CM

## 2011-05-04 MED ORDER — PREDNISONE 20 MG PO TABS: 40.0000 mg | ORAL_TABLET | Freq: Every day | ORAL | Status: DC

## 2011-05-04 MED ORDER — AZITHROMYCIN 250 MG PO TABS: 250.0000 mg | ORAL_TABLET | Freq: Every day | ORAL | Status: DC

## 2011-05-04 MED ORDER — ALBUTEROL 90 MCG/ACT IN AERS: 1.0000 | INHALATION_SPRAY | RESPIRATORY_TRACT | Status: DC | PRN

## 2011-05-04 MED ORDER — GUAIFENESIN-CODEINE 100-10 MG/5ML PO SYRP: 5.0000 mL | ORAL_SOLUTION | Freq: Four times a day (QID) | ORAL | Status: DC | PRN

## 2011-05-04 NOTE — ED Notes (Signed)
Chest congestion, heaviness, headache, backache, sore throat with productive cough of thick white/yellow mucus. Pt had a cold last week.
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
## 2011-05-06 ENCOUNTER — Telehealth: Payer: Self-pay

## 2011-05-06 NOTE — Telephone Encounter (Signed)
Noted - ok to offer SDA if desired - thanks

## 2011-05-06 NOTE — Telephone Encounter (Signed)
Pt was seen at [facility] urgent care Sat Dec 1st.

## 2011-05-06 NOTE — Telephone Encounter (Signed)
[facility] Triage Call Report Triage Record Num: [id number] Operator: Sula Rumple Patient Name: Michele [name] Call Date & Time: 05/03/2011 8:15:28PM Patient Phone: [phone] PCP: Rene Paci Patient Gender: Female PCP Fax : [phone] Patient DOB: [birth year] Practice Name: [facility] Reason for Call: Caller: [name]/Patient; PCP: Rene Paci; CB#: [phone]; Call Reason: Cough/Congestion; Sx Onset: 04/26/2011; Sx Notes: ; Afebrile; Wt: ; Home treatment(s) tried: Robitussin/Mussinex, ; Did home treatment help?: No; Guideline Used:URI ; Disp:See in 24 hours; Appt Scheduled?: No Pt calling on 05/03/11 states she has had cold symptoms for about one week/ denies fever/has expectorated yellow/pt advised to be seen/will call in AM for available appt. All emergent sx ruled out per Upper Respiratory Infection Protocl Protocol(s) Used: Upper Respiratory Infection (URI) Recommended Outcome per Protocol: See Provider within 24 hours Reason for Outcome: Productive cough with colored sputum (other than clear or white sputum) Care Advice: ~ Warm fluids may help, or try a mixture of honey and lemon juice in warm tea. 05/03/2011 8:25:29PM Page 1 of 1 [id number]

## 2011-05-07 ENCOUNTER — Other Ambulatory Visit: Payer: Self-pay | Admitting: *Deleted

## 2011-05-07 NOTE — Telephone Encounter (Signed)
Pt is requesting a refill of cough syrup be sent to [facility]. Pt states that MD is aware that she went to UC this past weekend for sxs and is still having severe coughing spells that at times cause urinary incontinence. Pt is aware that MD is out of office today-please advise

## 2011-05-08 ENCOUNTER — Encounter: Payer: Self-pay | Admitting: Internal Medicine

## 2011-05-08 ENCOUNTER — Ambulatory Visit (INDEPENDENT_AMBULATORY_CARE_PROVIDER_SITE_OTHER): Payer: Medicare Other | Admitting: Internal Medicine

## 2011-05-08 VITALS — BP 130/68 | HR 98 | Temp 98.1°F

## 2011-05-08 DIAGNOSIS — J441 Chronic obstructive pulmonary disease with (acute) exacerbation: Secondary | ICD-10-CM

## 2011-05-08 DIAGNOSIS — F17200 Nicotine dependence, unspecified, uncomplicated: Secondary | ICD-10-CM

## 2011-05-08 MED ORDER — GUAIFENESIN-CODEINE 100-10 MG/5ML PO SYRP: 5.0000 mL | ORAL_SOLUTION | Freq: Four times a day (QID) | ORAL | Status: DC | PRN

## 2011-05-08 MED ORDER — BENZONATATE 100 MG PO CAPS
100.0000 mg | ORAL_CAPSULE | Freq: Three times a day (TID) | ORAL | Status: DC | PRN
Start: 2011-05-08 — End: 2011-07-03

## 2011-05-08 MED ORDER — PREDNISONE (PAK) 10 MG PO TABS: 10.0000 mg | ORAL_TABLET | ORAL | Status: DC

## 2011-05-08 NOTE — Patient Instructions (Signed)
It was good to see you today. We have reviewed the chest x-ray results from urgent care: No pneumonia No need for additional antibiotics Continue the prednisone for another 6 days, also refill on cough syrup and begin Tessalon Perles for cough Your prescription(s) have been submitted to your pharmacy. Please take as directed and contact our office if you believe you are having problem(s) with the medication(s). Continue to work on discontinuation of smoking -use the Nicotrol to help you do the same

## 2011-05-08 NOTE — Telephone Encounter (Signed)
Okay. Time. Thanks

## 2011-05-08 NOTE — Telephone Encounter (Signed)
Faxed script to [place], but pt had also made f/u appt for this afternoon for cough...05/08/11@1 :25pm/LMB
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
## 2011-05-09 ENCOUNTER — Ambulatory Visit (INDEPENDENT_AMBULATORY_CARE_PROVIDER_SITE_OTHER): Payer: Medicare Other | Admitting: Internal Medicine

## 2011-05-09 ENCOUNTER — Encounter: Payer: Self-pay | Admitting: Internal Medicine

## 2011-05-09 VITALS — BP 132/70 | HR 95 | Temp 98.0°F

## 2011-05-09 DIAGNOSIS — J209 Acute bronchitis, unspecified: Secondary | ICD-10-CM

## 2011-05-09 DIAGNOSIS — Z720 Tobacco use: Secondary | ICD-10-CM

## 2011-05-09 DIAGNOSIS — F17200 Nicotine dependence, unspecified, uncomplicated: Secondary | ICD-10-CM

## 2011-05-09 MED ORDER — AZITHROMYCIN 250 MG PO TABS: ORAL_TABLET | ORAL | Status: AC

## 2011-05-09 MED ORDER — DICLOFENAC SODIUM 75 MG PO TBEC: 75.0000 mg | DELAYED_RELEASE_TABLET | Freq: Two times a day (BID) | ORAL | Status: DC

## 2011-05-09 MED ORDER — CHLORPHENIRAMINE-HYDROCODONE 8-10 MG/5ML PO LQCR: 5.0000 mL | Freq: Two times a day (BID) | ORAL | Status: DC | PRN

## 2011-05-09 NOTE — Patient Instructions (Signed)
It was good to see you today. Stop the prednisone and we will avoid steroids due to your side effects Continue the previous cough syrup and Tessalon Perles for cough, take Voltaren as a nonsteroidal anti-inflammatory pain Also empiric Z-Pak antibiotics as requested Your prescription(s) have been submitted to your pharmacy. Please take as directed and contact our office if you believe you are having problem(s) with the medication(s). If cough symptoms still unimproved, okay to fill Tussionex prescription as printed and given to you today Congratulations on reducing your cigarette use Also minimize inhaler use as much as possible to avoid raising heart symptoms
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
## 2011-05-26 ENCOUNTER — Other Ambulatory Visit: Payer: Self-pay | Admitting: Internal Medicine

## 2011-06-05 ENCOUNTER — Other Ambulatory Visit: Payer: Self-pay

## 2011-06-05 MED ORDER — CETIRIZINE-PSEUDOEPHEDRINE ER 5-120 MG PO TB12: 1.0000 | ORAL_TABLET | Freq: Two times a day (BID) | ORAL | Status: DC | PRN

## 2011-06-05 NOTE — Telephone Encounter (Signed)
Okay - Zyrtec-D 12 Hour as needed

## 2011-06-05 NOTE — Telephone Encounter (Signed)
Pt called c/o severe nasal congestion and HA. Pt is requesting allergy Rx to treat.

## 2011-06-11 ENCOUNTER — Ambulatory Visit: Payer: Medicare Other | Admitting: Internal Medicine

## 2011-06-11 DIAGNOSIS — Z0289 Encounter for other administrative examinations: Secondary | ICD-10-CM

## 2011-07-03 ENCOUNTER — Ambulatory Visit (INDEPENDENT_AMBULATORY_CARE_PROVIDER_SITE_OTHER): Payer: Medicare Other | Admitting: Internal Medicine

## 2011-07-03 ENCOUNTER — Encounter: Payer: Self-pay | Admitting: Internal Medicine

## 2011-07-03 ENCOUNTER — Other Ambulatory Visit (INDEPENDENT_AMBULATORY_CARE_PROVIDER_SITE_OTHER): Payer: Medicare Other

## 2011-07-03 DIAGNOSIS — J449 Chronic obstructive pulmonary disease, unspecified: Secondary | ICD-10-CM

## 2011-07-03 DIAGNOSIS — Z13820 Encounter for screening for osteoporosis: Secondary | ICD-10-CM

## 2011-07-03 DIAGNOSIS — E785 Hyperlipidemia, unspecified: Secondary | ICD-10-CM | POA: Diagnosis not present

## 2011-07-03 DIAGNOSIS — F411 Generalized anxiety disorder: Secondary | ICD-10-CM

## 2011-07-03 LAB — LIPID PANEL
Cholesterol: 169 mg/dL (ref 0–200)
HDL: 33.9 mg/dL — ABNORMAL LOW (ref >39.00)
Total CHOL/HDL Ratio: 5
Triglycerides: 204 mg/dL — ABNORMAL HIGH (ref 0.0–149.0)
VLDL: 40.8 mg/dL — ABNORMAL HIGH (ref 0.0–40.0)

## 2011-07-03 MED ORDER — CLONAZEPAM 1 MG PO TABS: 1.0000 mg | ORAL_TABLET | Freq: Three times a day (TID) | ORAL | Status: DC | PRN

## 2011-07-03 NOTE — Patient Instructions (Signed)
It was good to see you today. Medications reviewed, no changes at this time. Test(s) ordered today. Your results will be called to you after review (48-72hours after test completion). If any changes need to be made, you will be notified at that time. we'll make referral for bone density testing. Our office will contact you regarding appointment(s) once made. Work on lifestyle changes as discussed (low fat, low carb, increased protein diet; improved exercise efforts; weight loss) to control sugar, blood pressure and cholesterol levels and/or reduce risk of developing other medical problems. Look into [email or web address] or other type of food journal to assist you in this process. Continue to work on giving up the cigarettes as we discussed Please schedule followup in 6 months, call sooner if problems.
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
## 2011-07-03 NOTE — Assessment & Plan Note (Signed)
Reports never on med tx - recheck screening today and tx if needed

## 2011-07-03 NOTE — Assessment & Plan Note (Signed)
Significant component of anxiety evident on exam today -  discussed med options available to use with scheduled BZ and pt declines same- Previously changed from xanax to klonopin 06/2010 to help her anxiety control - refill same today Ok to continue same and encourage consideration of behav health counseling to augment tx of same Never started prozac as rx'd 10/2010

## 2011-07-03 NOTE — Assessment & Plan Note (Signed)
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 

## 2011-07-04 LAB — LDL CHOLESTEROL, DIRECT: Direct LDL: 92 mg/dL

## 2011-07-09 ENCOUNTER — Ambulatory Visit (INDEPENDENT_AMBULATORY_CARE_PROVIDER_SITE_OTHER)
Admission: RE | Admit: 2011-07-09 | Discharge: 2011-07-09 | Disposition: A | Discharge status: Home / Self Care | Payer: Medicare Other | Source: Ambulatory Visit

## 2011-07-09 DIAGNOSIS — Z13820 Encounter for screening for osteoporosis: Secondary | ICD-10-CM

## 2011-07-22 ENCOUNTER — Emergency Department (HOSPITAL_COMMUNITY): Payer: Medicare Other

## 2011-07-22 ENCOUNTER — Encounter (HOSPITAL_COMMUNITY): Payer: Self-pay | Admitting: *Deleted

## 2011-07-22 ENCOUNTER — Emergency Department (INDEPENDENT_AMBULATORY_CARE_PROVIDER_SITE_OTHER): Payer: Medicare Other

## 2011-07-22 ENCOUNTER — Emergency Department (INDEPENDENT_AMBULATORY_CARE_PROVIDER_SITE_OTHER)
Admission: EM | Admit: 2011-07-22 | Discharge: 2011-07-22 | Disposition: A | Discharge status: Home / Self Care | Payer: Medicare Other | Source: Home / Self Care | Attending: Emergency Medicine | Admitting: Emergency Medicine

## 2011-07-22 DIAGNOSIS — S99929A Unspecified injury of unspecified foot, initial encounter: Secondary | ICD-10-CM | POA: Diagnosis not present

## 2011-07-22 DIAGNOSIS — S139XXA Sprain of joints and ligaments of unspecified parts of neck, initial encounter: Secondary | ICD-10-CM

## 2011-07-22 DIAGNOSIS — M25469 Effusion, unspecified knee: Secondary | ICD-10-CM | POA: Diagnosis not present

## 2011-07-22 DIAGNOSIS — S43409A Unspecified sprain of unspecified shoulder joint, initial encounter: Secondary | ICD-10-CM

## 2011-07-22 DIAGNOSIS — S335XXA Sprain of ligaments of lumbar spine, initial encounter: Secondary | ICD-10-CM

## 2011-07-22 DIAGNOSIS — M25869 Other specified joint disorders, unspecified knee: Secondary | ICD-10-CM | POA: Diagnosis not present

## 2011-07-22 DIAGNOSIS — S8990XA Unspecified injury of unspecified lower leg, initial encounter: Secondary | ICD-10-CM | POA: Diagnosis not present

## 2011-07-22 DIAGNOSIS — IMO0002 Reserved for concepts with insufficient information to code with codable children: Secondary | ICD-10-CM

## 2011-07-22 DIAGNOSIS — S8390XA Sprain of unspecified site of unspecified knee, initial encounter: Secondary | ICD-10-CM

## 2011-07-22 DIAGNOSIS — S99919A Unspecified injury of unspecified ankle, initial encounter: Secondary | ICD-10-CM | POA: Diagnosis not present

## 2011-07-22 DIAGNOSIS — S39012A Strain of muscle, fascia and tendon of lower back, initial encounter: Secondary | ICD-10-CM

## 2011-07-22 DIAGNOSIS — M549 Dorsalgia, unspecified: Secondary | ICD-10-CM | POA: Diagnosis not present

## 2011-07-22 MED ORDER — METHOCARBAMOL 500 MG PO TABS: 500.0000 mg | ORAL_TABLET | Freq: Three times a day (TID) | ORAL | Status: AC

## 2011-07-22 MED ORDER — TRAMADOL HCL 50 MG PO TABS: 100.0000 mg | ORAL_TABLET | Freq: Three times a day (TID) | ORAL | Status: AC | PRN

## 2011-07-22 NOTE — Discharge Instructions (Signed)
Back Exercises Back exercises help treat and prevent back injuries. The goal of back exercises is to increase the strength of your abdominal and back muscles and the flexibility of your back. These exercises should be started when you no longer have back pain. Back exercises include:  Pelvic Tilt. Lie on your back with your knees bent. Tilt your pelvis until the lower part of your back is against the floor. Hold this position 5 to 10 sec and repeat 5 to 10 times.   Knee to Chest. Pull first 1 knee up against your chest and hold for 20 to 30 seconds, repeat this with the other knee, and then both knees. This may be done with the other leg straight or bent, whichever feels better.   Sit-Ups or Curl-Ups. Bend your knees 90 degrees. Start with tilting your pelvis, and do a partial, slow sit-up, lifting your trunk only 30 to 45 degrees off the floor. Take at least 2 to 3 seconds for each sit-up. Do not do sit-ups with your knees out straight. If partial sit-ups are difficult, simply do the above but with only tightening your abdominal muscles and holding it as directed.   Hip-Lift. Lie on your back with your knees flexed 90 degrees. Push down with your feet and shoulders as you raise your hips a couple inches off the floor; hold for 10 seconds, repeat 5 to 10 times.   Back arches. Lie on your stomach, propping yourself up on bent elbows. Slowly press on your hands, causing an arch in your low back. Repeat 3 to 5 times. Any initial stiffness and discomfort should lessen with repetition over time.   Shoulder-Lifts. Lie face down with arms beside your body. Keep hips and torso pressed to floor as you slowly lift your head and shoulders off the floor.  Do not overdo your exercises, especially in the beginning. Exercises may cause you some mild back discomfort which lasts for a few minutes; however, if the pain is more severe, or lasts for more than 15 minutes, do not continue exercises until you see your  caregiver. Improvement with exercise therapy for back problems is slow.  See your caregivers for assistance with developing a proper back exercise program. Document Released: 06/27/2004 Document Revised: 01/16/2011 Document Reviewed: 05/20/2005 [employer] Patient Information 2012 [employer] 

## 2011-07-22 NOTE — ED Notes (Signed)
Pt is here with complaints of left knee, hip and shoulder pain after fall on ice last night.
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
## 2011-07-30 ENCOUNTER — Encounter: Payer: Self-pay | Admitting: Internal Medicine

## 2011-07-30 DIAGNOSIS — M8580 Other specified disorders of bone density and structure, unspecified site: Secondary | ICD-10-CM | POA: Insufficient documentation

## 2011-08-07 ENCOUNTER — Encounter: Payer: Self-pay | Admitting: Internal Medicine

## 2011-08-07 ENCOUNTER — Ambulatory Visit: Payer: Medicare Other | Admitting: Internal Medicine

## 2011-08-07 ENCOUNTER — Ambulatory Visit (INDEPENDENT_AMBULATORY_CARE_PROVIDER_SITE_OTHER)
Admission: RE | Admit: 2011-08-07 | Discharge: 2011-08-07 | Disposition: A | Discharge status: Home / Self Care | Payer: Medicare Other | Source: Ambulatory Visit | Attending: Internal Medicine | Admitting: Internal Medicine

## 2011-08-07 ENCOUNTER — Ambulatory Visit (INDEPENDENT_AMBULATORY_CARE_PROVIDER_SITE_OTHER): Payer: Medicare Other | Admitting: Internal Medicine

## 2011-08-07 VITALS — BP 110/72 | HR 81 | Temp 98.3°F

## 2011-08-07 DIAGNOSIS — T148XXA Other injury of unspecified body region, initial encounter: Secondary | ICD-10-CM | POA: Diagnosis not present

## 2011-08-07 DIAGNOSIS — M545 Low back pain, unspecified: Secondary | ICD-10-CM | POA: Diagnosis not present

## 2011-08-07 DIAGNOSIS — M25562 Pain in left knee: Secondary | ICD-10-CM

## 2011-08-07 DIAGNOSIS — M25519 Pain in unspecified shoulder: Secondary | ICD-10-CM

## 2011-08-07 DIAGNOSIS — M542 Cervicalgia: Secondary | ICD-10-CM

## 2011-08-07 DIAGNOSIS — M25569 Pain in unspecified knee: Secondary | ICD-10-CM

## 2011-08-07 DIAGNOSIS — M25869 Other specified joint disorders, unspecified knee: Secondary | ICD-10-CM | POA: Diagnosis not present

## 2011-08-07 MED ORDER — CYCLOBENZAPRINE HCL 5 MG PO TABS: 5.0000 mg | ORAL_TABLET | Freq: Three times a day (TID) | ORAL | Status: DC | PRN

## 2011-08-07 NOTE — Patient Instructions (Signed)
It was good to see you today.  we have reviewed your injuries from the fall on February 17 Change muscle relaxant to Flexeril at bedtime and as needed for muscle spasm pain - Your prescription(s) have been submitted to your pharmacy. Please take as directed and contact our office if you believe you are having problem(s) with the medication(s). Continue Tylenol, or tramadol, for pain as needed and ice to painful joints 3 times daily We will check with [facility] orthopedics or other orthopedic office regarding appointment sooner than 3/20 if available Will recheck x-ray of knee today, you'll be called with these results after review
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
## 2011-08-08 ENCOUNTER — Other Ambulatory Visit: Payer: Self-pay | Admitting: *Deleted

## 2011-08-08 NOTE — Telephone Encounter (Signed)
Received PA for cyclobenzaprine. Contacted insurance comp spoke with [employer] will not cover med. Md has to choose between baclofen 10 mg, metazalone 800 mg, and methocarbamol.... 08/08/11@3 :09pm/LMB

## 2011-08-09 MED ORDER — BACLOFEN 10 MG PO TABS: 10.0000 mg | ORAL_TABLET | Freq: Three times a day (TID) | ORAL | Status: AC | PRN

## 2011-08-09 NOTE — Telephone Encounter (Signed)
Change to baclofen 10, erx done - thanks

## 2011-08-09 NOTE — Telephone Encounter (Signed)
Notified pt of med change rx sent to pharmacy... 08/09/11@10 :58am/LMB

## 2011-08-11 IMAGING — CR DG CHEST 2V
2 series · 2 of 2 positions shown · non-contrast
Comparison: 10/27/2008

CLINICAL DATA: Chest pain.  COPD peri

CHEST - 2 VIEW

[w chest pa]
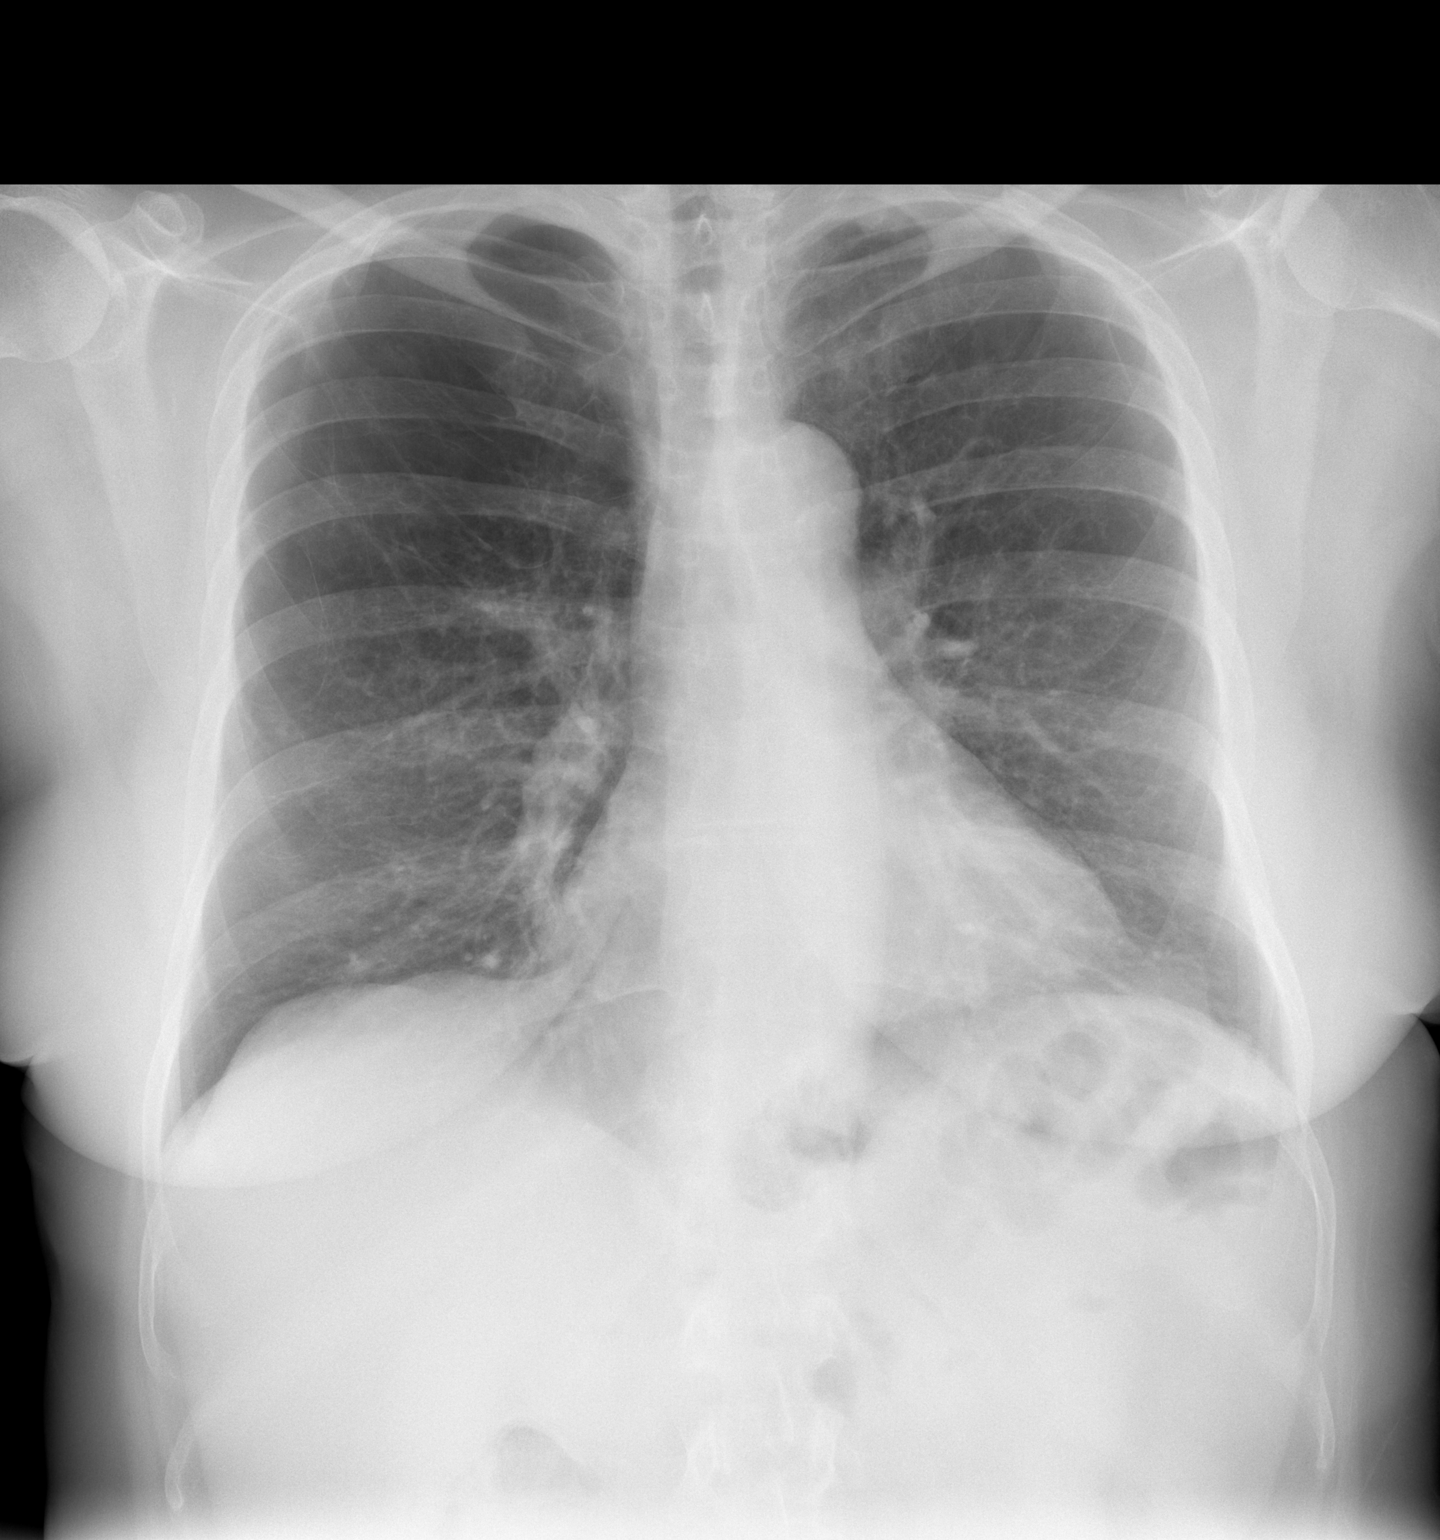

[w chest lat *]
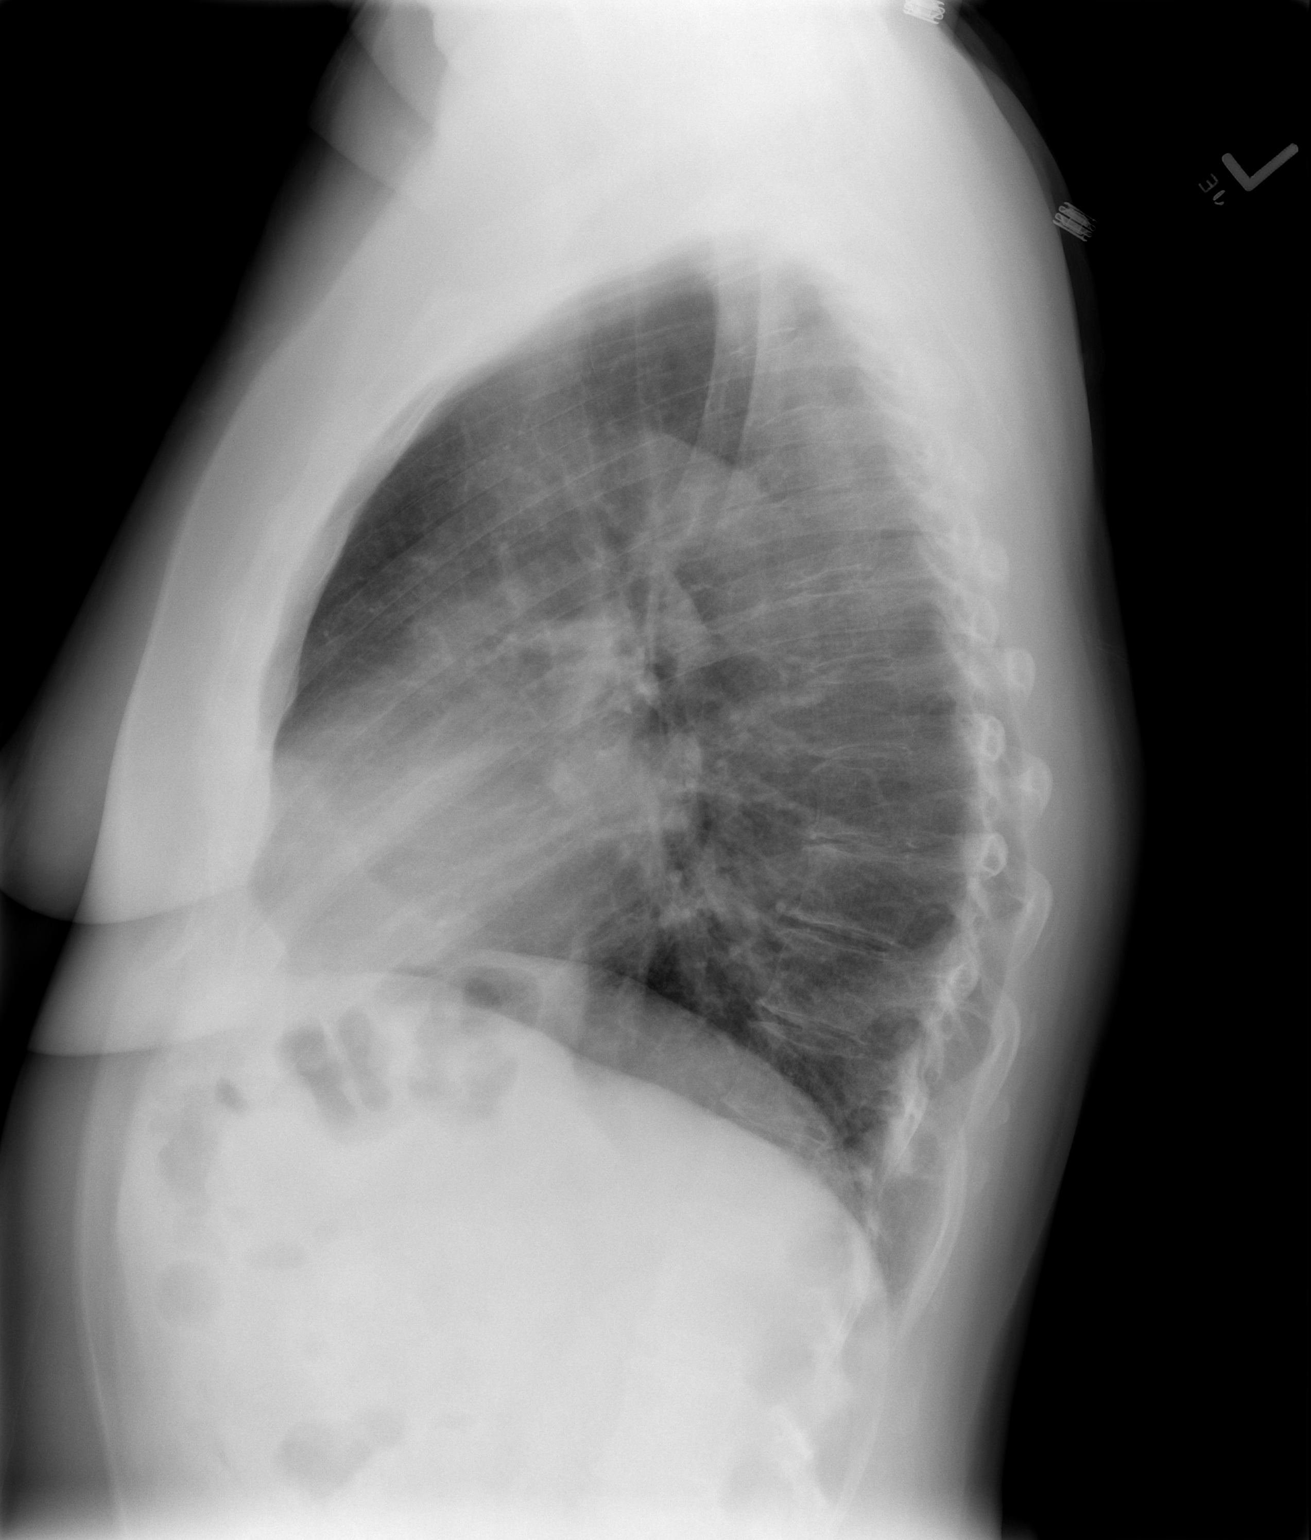

[2 of 2 positions shown; findings below may reference images not displayed]

FINDINGS: Prominent emphysema noted.

Mild cardiomegaly is present.  No specific findings of edema
although emphysema can mask radiographic findings of early
pulmonary edema.

No pleural effusion is noted.  No discrete airspace opacity.
IMPRESSION: 1.  Severe emphysema.
2.  Cardiomegaly.

## 2011-08-20 DIAGNOSIS — M25569 Pain in unspecified knee: Secondary | ICD-10-CM | POA: Diagnosis not present

## 2011-08-20 DIAGNOSIS — M542 Cervicalgia: Secondary | ICD-10-CM | POA: Diagnosis not present

## 2011-08-23 ENCOUNTER — Other Ambulatory Visit: Payer: Self-pay | Admitting: Internal Medicine

## 2011-08-23 NOTE — Telephone Encounter (Signed)
Clonazepam request [last refill 01.30.13 #[id number]]

## 2011-09-10 ENCOUNTER — Ambulatory Visit (INDEPENDENT_AMBULATORY_CARE_PROVIDER_SITE_OTHER): Payer: Medicare Other | Admitting: Internal Medicine

## 2011-09-10 ENCOUNTER — Encounter: Payer: Self-pay | Admitting: Internal Medicine

## 2011-09-10 ENCOUNTER — Other Ambulatory Visit (INDEPENDENT_AMBULATORY_CARE_PROVIDER_SITE_OTHER): Payer: Medicare Other

## 2011-09-10 VITALS — BP 126/70 | HR 89 | Temp 98.4°F | Resp 16 | Ht 65.0 in | Wt 159.0 lb

## 2011-09-10 DIAGNOSIS — J209 Acute bronchitis, unspecified: Secondary | ICD-10-CM | POA: Diagnosis not present

## 2011-09-10 DIAGNOSIS — R7309 Other abnormal glucose: Secondary | ICD-10-CM | POA: Diagnosis not present

## 2011-09-10 DIAGNOSIS — Z720 Tobacco use: Secondary | ICD-10-CM

## 2011-09-10 DIAGNOSIS — J309 Allergic rhinitis, unspecified: Secondary | ICD-10-CM | POA: Diagnosis not present

## 2011-09-10 DIAGNOSIS — R739 Hyperglycemia, unspecified: Secondary | ICD-10-CM

## 2011-09-10 DIAGNOSIS — F17200 Nicotine dependence, unspecified, uncomplicated: Secondary | ICD-10-CM

## 2011-09-10 MED ORDER — LORATADINE 10 MG PO TABS: 10.0000 mg | ORAL_TABLET | Freq: Every day | ORAL | Status: DC | PRN

## 2011-09-10 MED ORDER — DOXYCYCLINE HYCLATE 100 MG PO TABS: 100.0000 mg | ORAL_TABLET | Freq: Two times a day (BID) | ORAL | Status: AC

## 2011-09-10 MED ORDER — BENZONATATE 100 MG PO CAPS: 100.0000 mg | ORAL_CAPSULE | Freq: Three times a day (TID) | ORAL | Status: DC | PRN

## 2011-09-10 NOTE — Patient Instructions (Signed)
It was good to see you today. Doxycycline antibiotics 2x/day x 1 week, Tessalon Perles for cough, and claritin daily for allergy symptoms - Your prescription(s) have been submitted to your pharmacy. Please take as directed and contact our office if you believe you are having problem(s) with the medication(s). Congratulations on reducing and quitting your cigarette use Also minimize inhaler use as much as possible to avoid raising heart symptoms Test(s) ordered today. Your results will be called to you after review (48-72hours after test completion). If any changes need to be made, you will be notified at that time.
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
## 2011-09-12 LAB — HEMOGLOBIN A1C: Hgb A1c MFr Bld: 6.1 % (ref 4.6–6.5)

## 2011-09-13 ENCOUNTER — Telehealth: Payer: Self-pay

## 2011-09-13 MED ORDER — HYDROCODONE-HOMATROPINE 5-1.5 MG/5ML PO SYRP: 5.0000 mL | ORAL_SOLUTION | Freq: Four times a day (QID) | ORAL | Status: AC | PRN

## 2011-09-13 MED ORDER — AZITHROMYCIN 250 MG PO TABS: ORAL_TABLET | ORAL | Status: AC

## 2011-09-13 NOTE — Telephone Encounter (Signed)
Called left message to call back 

## 2011-09-13 NOTE — Telephone Encounter (Signed)
Done hardcopy to robin  

## 2011-09-13 NOTE — Telephone Encounter (Signed)
Pt advised.

## 2011-09-13 NOTE — Telephone Encounter (Signed)
Pt called stating ABX and cough medication are not helping, she feels worse now than she did at OV. Pt is requesting to change to z-pak and a different cough medication, please advise in Dr Diamantina Monks absence thanks!

## 2011-10-02 DIAGNOSIS — I2119 ST elevation (STEMI) myocardial infarction involving other coronary artery of inferior wall: Secondary | ICD-10-CM

## 2011-10-02 DIAGNOSIS — I2510 Atherosclerotic heart disease of native coronary artery without angina pectoris: Secondary | ICD-10-CM

## 2011-10-02 HISTORY — DX: Atherosclerotic heart disease of native coronary artery without angina pectoris: I25.10

## 2011-10-02 HISTORY — DX: ST elevation (STEMI) myocardial infarction involving other coronary artery of inferior wall: I21.19

## 2011-10-10 ENCOUNTER — Other Ambulatory Visit: Payer: Self-pay

## 2011-10-10 ENCOUNTER — Encounter (HOSPITAL_COMMUNITY)
Admission: EM | Disposition: A | Discharge status: Home / Self Care | Payer: Self-pay | Source: Home / Self Care | Attending: Cardiology

## 2011-10-10 ENCOUNTER — Inpatient Hospital Stay (HOSPITAL_COMMUNITY)
Admission: EM | Admit: 2011-10-10 | Discharge: 2011-10-12 | DRG: 247 | Disposition: A | Discharge status: Home / Self Care | Payer: Medicare Other | Attending: Cardiology | Admitting: Cardiology

## 2011-10-10 ENCOUNTER — Encounter (HOSPITAL_COMMUNITY): Payer: Self-pay | Admitting: Emergency Medicine

## 2011-10-10 ENCOUNTER — Ambulatory Visit (HOSPITAL_COMMUNITY): Admit: 2011-10-10 | Payer: Self-pay | Admitting: Cardiology

## 2011-10-10 DIAGNOSIS — F341 Dysthymic disorder: Secondary | ICD-10-CM | POA: Diagnosis present

## 2011-10-10 DIAGNOSIS — I25119 Atherosclerotic heart disease of native coronary artery with unspecified angina pectoris: Secondary | ICD-10-CM | POA: Diagnosis present

## 2011-10-10 DIAGNOSIS — F17200 Nicotine dependence, unspecified, uncomplicated: Secondary | ICD-10-CM | POA: Diagnosis present

## 2011-10-10 DIAGNOSIS — K219 Gastro-esophageal reflux disease without esophagitis: Secondary | ICD-10-CM | POA: Diagnosis present

## 2011-10-10 DIAGNOSIS — F411 Generalized anxiety disorder: Secondary | ICD-10-CM | POA: Diagnosis present

## 2011-10-10 DIAGNOSIS — F329 Major depressive disorder, single episode, unspecified: Secondary | ICD-10-CM | POA: Diagnosis present

## 2011-10-10 DIAGNOSIS — M1990 Unspecified osteoarthritis, unspecified site: Secondary | ICD-10-CM | POA: Diagnosis present

## 2011-10-10 DIAGNOSIS — R079 Chest pain, unspecified: Secondary | ICD-10-CM | POA: Diagnosis not present

## 2011-10-10 DIAGNOSIS — I2510 Atherosclerotic heart disease of native coronary artery without angina pectoris: Secondary | ICD-10-CM | POA: Diagnosis not present

## 2011-10-10 DIAGNOSIS — E559 Vitamin D deficiency, unspecified: Secondary | ICD-10-CM | POA: Diagnosis present

## 2011-10-10 DIAGNOSIS — Z79899 Other long term (current) drug therapy: Secondary | ICD-10-CM

## 2011-10-10 DIAGNOSIS — J449 Chronic obstructive pulmonary disease, unspecified: Secondary | ICD-10-CM | POA: Diagnosis present

## 2011-10-10 DIAGNOSIS — I219 Acute myocardial infarction, unspecified: Secondary | ICD-10-CM

## 2011-10-10 DIAGNOSIS — I2119 ST elevation (STEMI) myocardial infarction involving other coronary artery of inferior wall: Secondary | ICD-10-CM

## 2011-10-10 DIAGNOSIS — R569 Unspecified convulsions: Secondary | ICD-10-CM | POA: Diagnosis present

## 2011-10-10 DIAGNOSIS — J4489 Other specified chronic obstructive pulmonary disease: Secondary | ICD-10-CM | POA: Diagnosis present

## 2011-10-10 DIAGNOSIS — E785 Hyperlipidemia, unspecified: Secondary | ICD-10-CM | POA: Diagnosis present

## 2011-10-10 DIAGNOSIS — G40909 Epilepsy, unspecified, not intractable, without status epilepticus: Secondary | ICD-10-CM | POA: Diagnosis present

## 2011-10-10 DIAGNOSIS — M47812 Spondylosis without myelopathy or radiculopathy, cervical region: Secondary | ICD-10-CM | POA: Diagnosis present

## 2011-10-10 DIAGNOSIS — K449 Diaphragmatic hernia without obstruction or gangrene: Secondary | ICD-10-CM | POA: Diagnosis present

## 2011-10-10 DIAGNOSIS — Z955 Presence of coronary angioplasty implant and graft: Secondary | ICD-10-CM

## 2011-10-10 DIAGNOSIS — F419 Anxiety disorder, unspecified: Secondary | ICD-10-CM | POA: Diagnosis present

## 2011-10-10 DIAGNOSIS — F32A Depression, unspecified: Secondary | ICD-10-CM | POA: Diagnosis present

## 2011-10-10 DIAGNOSIS — G2581 Restless legs syndrome: Secondary | ICD-10-CM | POA: Diagnosis present

## 2011-10-10 HISTORY — PX: CORONARY ANGIOPLASTY WITH STENT PLACEMENT: SHX49

## 2011-10-10 HISTORY — PX: LEFT HEART CATHETERIZATION WITH CORONARY ANGIOGRAM: SHX5451

## 2011-10-10 LAB — CBC
HCT: 38.9 % (ref 36.0–46.0)
HCT: 37.6 % (ref 36.0–46.0)
Hemoglobin: 13.2 g/dL (ref 12.0–15.0)
Hemoglobin: 12.9 g/dL (ref 12.0–15.0)
MCH: 29.8 pg (ref 26.0–34.0)
MCH: 29.9 pg (ref 26.0–34.0)
MCHC: 33.9 g/dL (ref 30.0–36.0)
MCHC: 34.3 g/dL (ref 30.0–36.0)
MCV: 87.8 fL (ref 78.0–100.0)
MCV: 87.2 fL (ref 78.0–100.0)
Platelets: 182 10*3/uL (ref 150–400)
Platelets: 157 10*3/uL (ref 150–400)
RBC: 4.43 MIL/uL (ref 3.87–5.11)
RBC: 4.31 MIL/uL (ref 3.87–5.11)
RDW: 13.1 % (ref 11.5–15.5)
RDW: 13.2 % (ref 11.5–15.5)
WBC: 7.3 10*3/uL (ref 4.0–10.5)
WBC: 9.4 10*3/uL (ref 4.0–10.5)

## 2011-10-10 LAB — HEMOGLOBIN A1C
Hgb A1c MFr Bld: 6.2 % — ABNORMAL HIGH (ref <5.7)
Mean Plasma Glucose: 131 mg/dL — ABNORMAL HIGH (ref <117)

## 2011-10-10 LAB — CARDIAC PANEL(CRET KIN+CKTOT+MB+TROPI)
CK, MB: 1.7 ng/mL (ref 0.3–4.0)
CK, MB: 84.2 ng/mL (ref 0.3–4.0)
CK, MB: 153.5 ng/mL (ref 0.3–4.0)
CK, MB: 72 ng/mL (ref 0.3–4.0)
Relative Index: INVALID (ref 0.0–2.5)
Relative Index: 9.8 — ABNORMAL HIGH (ref 0.0–2.5)
Relative Index: 13.6 — ABNORMAL HIGH (ref 0.0–2.5)
Relative Index: 11.7 — ABNORMAL HIGH (ref 0.0–2.5)
Total CK: 29 U/L (ref 7–177)
Total CK: 857 U/L — ABNORMAL HIGH (ref 7–177)
Total CK: 1126 U/L — ABNORMAL HIGH (ref 7–177)
Total CK: 613 U/L — ABNORMAL HIGH (ref 7–177)
Troponin I: <0.3 ng/mL (ref <0.30)
Troponin I: 18.94 ng/mL (ref <0.30)
Troponin I: >25 ng/mL (ref <0.30)
Troponin I: >25 ng/mL (ref <0.30)

## 2011-10-10 LAB — BASIC METABOLIC PANEL
BUN: 18 mg/dL (ref 6–23)
CO2: 23 mEq/L (ref 19–32)
Calcium: 9 mg/dL (ref 8.4–10.5)
Chloride: 108 mEq/L (ref 96–112)
Creatinine, Ser: 0.66 mg/dL (ref 0.50–1.10)
GFR calc Af Amer: >90 mL/min (ref >90)
GFR calc non Af Amer: >90 mL/min (ref >90)
Glucose, Bld: 159 mg/dL — ABNORMAL HIGH (ref 70–99)
Potassium: 3.5 mEq/L (ref 3.5–5.1)
Sodium: 141 mEq/L (ref 135–145)

## 2011-10-10 LAB — POCT I-STAT, CHEM 8
BUN: 19 mg/dL (ref 6–23)
BUN: 17 mg/dL (ref 6–23)
Calcium, Ion: 1.22 mmol/L (ref 1.12–1.32)
Calcium, Ion: 1.16 mmol/L (ref 1.12–1.32)
Chloride: 107 mEq/L (ref 96–112)
Chloride: 109 mEq/L (ref 96–112)
Creatinine, Ser: 0.7 mg/dL (ref 0.50–1.10)
Creatinine, Ser: 0.5 mg/dL (ref 0.50–1.10)
Glucose, Bld: 160 mg/dL — ABNORMAL HIGH (ref 70–99)
Glucose, Bld: 158 mg/dL — ABNORMAL HIGH (ref 70–99)
HCT: 38 % (ref 36.0–46.0)
HCT: 35 % — ABNORMAL LOW (ref 36.0–46.0)
Hemoglobin: 12.9 g/dL (ref 12.0–15.0)
Hemoglobin: 11.9 g/dL — ABNORMAL LOW (ref 12.0–15.0)
Potassium: 3.6 mEq/L (ref 3.5–5.1)
Potassium: 3.2 mEq/L — ABNORMAL LOW (ref 3.5–5.1)
Sodium: 141 mEq/L (ref 135–145)
Sodium: 143 mEq/L (ref 135–145)
TCO2: 24 mmol/L (ref 0–100)
TCO2: 24 mmol/L (ref 0–100)

## 2011-10-10 LAB — DIFFERENTIAL
Basophils Absolute: 0 10*3/uL (ref 0.0–0.1)
Basophils Relative: 0 % (ref 0–1)
Eosinophils Absolute: 0.2 10*3/uL (ref 0.0–0.7)
Eosinophils Relative: 2 % (ref 0–5)
Lymphocytes Relative: 27 % (ref 12–46)
Lymphs Abs: 2 10*3/uL (ref 0.7–4.0)
Monocytes Absolute: 0.5 10*3/uL (ref 0.1–1.0)
Monocytes Relative: 6 % (ref 3–12)
Neutro Abs: 4.7 10*3/uL (ref 1.7–7.7)
Neutrophils Relative %: 65 % (ref 43–77)

## 2011-10-10 LAB — COMPREHENSIVE METABOLIC PANEL
ALT: 17 U/L (ref 0–35)
AST: 58 U/L — ABNORMAL HIGH (ref 0–37)
Albumin: 3.3 g/dL — ABNORMAL LOW (ref 3.5–5.2)
Alkaline Phosphatase: 70 U/L (ref 39–117)
BUN: 14 mg/dL (ref 6–23)
CO2: 21 mEq/L (ref 19–32)
Calcium: 8.4 mg/dL (ref 8.4–10.5)
Chloride: 110 mEq/L (ref 96–112)
Creatinine, Ser: 0.55 mg/dL (ref 0.50–1.10)
GFR calc Af Amer: >90 mL/min (ref >90)
GFR calc non Af Amer: >90 mL/min (ref >90)
Glucose, Bld: 132 mg/dL — ABNORMAL HIGH (ref 70–99)
Potassium: 4 mEq/L (ref 3.5–5.1)
Sodium: 139 mEq/L (ref 135–145)
Total Bilirubin: 0.2 mg/dL — ABNORMAL LOW (ref 0.3–1.2)
Total Protein: 5.8 g/dL — ABNORMAL LOW (ref 6.0–8.3)

## 2011-10-10 LAB — PROTIME-INR
INR: 1.01 (ref 0.00–1.49)
Prothrombin Time: 13.5 seconds (ref 11.6–15.2)

## 2011-10-10 LAB — MRSA PCR SCREENING: MRSA by PCR: NEGATIVE

## 2011-10-10 LAB — APTT: aPTT: 31 seconds (ref 24–37)

## 2011-10-10 LAB — POCT ACTIVATED CLOTTING TIME: Activated Clotting Time: 386 seconds

## 2011-10-10 SURGERY — LEFT HEART CATHETERIZATION WITH CORONARY ANGIOGRAM
Anesthesia: LOCAL

## 2011-10-10 MED ORDER — MIDAZOLAM HCL 2 MG/2ML IJ SOLN
INTRAMUSCULAR | Status: AC
  Filled 2011-10-10: qty 2

## 2011-10-10 MED ORDER — LIDOCAINE HCL (PF) 1 % IJ SOLN
INTRAMUSCULAR | Status: AC
  Filled 2011-10-10: qty 30

## 2011-10-10 MED ORDER — SODIUM CHLORIDE 0.9 % IV SOLN
INTRAVENOUS | Status: DC
  Administered 2011-10-10: 04:00:00 via INTRAVENOUS

## 2011-10-10 MED ORDER — ATORVASTATIN CALCIUM 40 MG PO TABS
40.0000 mg | ORAL_TABLET | Freq: Every day | ORAL | Status: DC
  Administered 2011-10-10 – 2011-10-11 (×2): 40 mg via ORAL
  Filled 2011-10-10 (×4): qty 1

## 2011-10-10 MED ORDER — NITROGLYCERIN IN D5W 200-5 MCG/ML-% IV SOLN
10.0000 ug/min | Freq: Once | INTRAVENOUS | Status: AC
  Administered 2011-10-10: 10 ug/min via INTRAVENOUS

## 2011-10-10 MED ORDER — HEPARIN BOLUS VIA INFUSION
4000.0000 [IU] | Freq: Once | INTRAVENOUS | Status: AC
  Administered 2011-10-10: 4000 [IU] via INTRAVENOUS

## 2011-10-10 MED ORDER — SODIUM CHLORIDE 0.9 % IJ SOLN
3.0000 mL | Freq: Two times a day (BID) | INTRAMUSCULAR | Status: DC
  Administered 2011-10-10 – 2011-10-11 (×5): 3 mL via INTRAVENOUS

## 2011-10-10 MED ORDER — BIVALIRUDIN 250 MG IV SOLR
INTRAVENOUS | Status: AC
  Filled 2011-10-10: qty 250

## 2011-10-10 MED ORDER — LORATADINE 10 MG PO TABS
10.0000 mg | ORAL_TABLET | Freq: Every day | ORAL | Status: DC | PRN
  Filled 2011-10-10: qty 1

## 2011-10-10 MED ORDER — BENZONATATE 100 MG PO CAPS
100.0000 mg | ORAL_CAPSULE | Freq: Three times a day (TID) | ORAL | Status: DC | PRN
  Filled 2011-10-10: qty 1

## 2011-10-10 MED ORDER — ALBUTEROL SULFATE HFA 108 (90 BASE) MCG/ACT IN AERS: 2.0000 | INHALATION_SPRAY | RESPIRATORY_TRACT | Status: DC | PRN

## 2011-10-10 MED ORDER — ONDANSETRON HCL 4 MG/2ML IJ SOLN: 4.0000 mg | Freq: Four times a day (QID) | INTRAMUSCULAR | Status: DC | PRN

## 2011-10-10 MED ORDER — MORPHINE SULFATE 2 MG/ML IJ SOLN
2.0000 mg | INTRAMUSCULAR | Status: DC | PRN
  Filled 2011-10-10: qty 1

## 2011-10-10 MED ORDER — PRASUGREL HCL 10 MG PO TABS
ORAL_TABLET | ORAL | Status: AC
  Filled 2011-10-10: qty 6

## 2011-10-10 MED ORDER — NITROGLYCERIN IN D5W 200-5 MCG/ML-% IV SOLN
INTRAVENOUS | Status: AC
  Administered 2011-10-10: 10 ug/min via INTRAVENOUS
  Filled 2011-10-10: qty 250

## 2011-10-10 MED ORDER — PANTOPRAZOLE SODIUM 40 MG PO TBEC
40.0000 mg | DELAYED_RELEASE_TABLET | Freq: Two times a day (BID) | ORAL | Status: DC
  Administered 2011-10-10 – 2011-10-12 (×4): 40 mg via ORAL
  Filled 2011-10-10 (×4): qty 1

## 2011-10-10 MED ORDER — SODIUM CHLORIDE 0.9 % IJ SOLN: 3.0000 mL | INTRAMUSCULAR | Status: DC | PRN

## 2011-10-10 MED ORDER — CLONAZEPAM 1 MG PO TABS
1.0000 mg | ORAL_TABLET | Freq: Three times a day (TID) | ORAL | Status: DC | PRN
  Administered 2011-10-10 – 2011-10-12 (×5): 1 mg via ORAL
  Filled 2011-10-10 (×5): qty 1

## 2011-10-10 MED ORDER — SODIUM CHLORIDE 0.9 % IV SOLN: 250.0000 mL | INTRAVENOUS | Status: DC | PRN

## 2011-10-10 MED ORDER — ALUM & MAG HYDROXIDE-SIMETH 200-200-20 MG/5ML PO SUSP
30.0000 mL | Freq: Four times a day (QID) | ORAL | Status: DC | PRN
  Administered 2011-10-10: 30 mL via ORAL
  Filled 2011-10-10: qty 30

## 2011-10-10 MED ORDER — HEPARIN SODIUM (PORCINE) 5000 UNIT/ML IJ SOLN
INTRAMUSCULAR | Status: AC
  Administered 2011-10-10: 5000 [IU]
  Filled 2011-10-10: qty 1

## 2011-10-10 MED ORDER — PNEUMOCOCCAL VAC POLYVALENT 25 MCG/0.5ML IJ INJ
0.5000 mL | INJECTION | INTRAMUSCULAR | Status: AC
  Filled 2011-10-10: qty 0.5

## 2011-10-10 MED ORDER — HEPARIN (PORCINE) IN NACL 2-0.9 UNIT/ML-% IJ SOLN
INTRAMUSCULAR | Status: AC
  Filled 2011-10-10: qty 2000

## 2011-10-10 MED ORDER — NITROGLYCERIN 0.2 MG/ML ON CALL CATH LAB
INTRAVENOUS | Status: AC
  Filled 2011-10-10: qty 1

## 2011-10-10 MED ORDER — PRASUGREL HCL 10 MG PO TABS
10.0000 mg | ORAL_TABLET | Freq: Every day | ORAL | Status: DC
  Administered 2011-10-10 – 2011-10-12 (×3): 10 mg via ORAL
  Filled 2011-10-10 (×3): qty 1

## 2011-10-10 MED ORDER — BIOTENE DRY MOUTH MT LIQD
15.0000 mL | Freq: Two times a day (BID) | OROMUCOSAL | Status: DC
  Administered 2011-10-10 – 2011-10-12 (×3): 15 mL via OROMUCOSAL

## 2011-10-10 MED ORDER — FENTANYL CITRATE 0.05 MG/ML IJ SOLN
INTRAMUSCULAR | Status: AC
  Filled 2011-10-10: qty 2

## 2011-10-10 MED ORDER — METOPROLOL SUCCINATE ER 25 MG PO TB24
25.0000 mg | ORAL_TABLET | Freq: Every day | ORAL | Status: DC
  Administered 2011-10-10 – 2011-10-12 (×3): 25 mg via ORAL
  Filled 2011-10-10 (×3): qty 1

## 2011-10-10 MED ORDER — ASPIRIN 81 MG PO CHEW
81.0000 mg | CHEWABLE_TABLET | Freq: Every day | ORAL | Status: DC
  Administered 2011-10-10 – 2011-10-12 (×3): 81 mg via ORAL
  Filled 2011-10-10 (×4): qty 1

## 2011-10-10 MED ORDER — ALBUTEROL 90 MCG/ACT IN AERS: 2.0000 | INHALATION_SPRAY | RESPIRATORY_TRACT | Status: DC | PRN

## 2011-10-10 MED ORDER — ACETAMINOPHEN 325 MG PO TABS
650.0000 mg | ORAL_TABLET | ORAL | Status: DC | PRN
  Administered 2011-10-12: 650 mg via ORAL
  Filled 2011-10-10: qty 2

## 2011-10-10 NOTE — CV Procedure (Addendum)
[facility]     CARDIAC CATHETERIZATION REPORT  NAME:  Michele [name]     MRN: [id number] DATE OF BIRTH:  [birth year]   ADMIT DATE:  10/10/2011  Performing Cardiologist: Marykay Lex, M.D., M.S. Primary Physician: Rene Paci, MD, MD Primary Cardiologist:  Marykay Lex, MD, MS  Procedures Performed:  Left Heart Catheterization via 6 Fr Right Common Femoral Artery access  Left Ventriculography, (RAO) 12 ml/sec for 25 ml total contrast  Native Coronary Angiography  IC NTG Injection 200 mcg x 2  Percutaneous Coronary Artery Intervention on mid Circumflex with 2 overlapping Promus Element DES 2.5 mm x 12 mm (distal) and 2.5 mm x 8 mm proximal; final diameter 2.8 mm proximal.  Indication(s): Inferior STEMI  History: 56 y.o. 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  She was doing well until ~2300 last PM (10/09/11) when she began feeling left sided chest pressure and shortness of breath. She got up to drink something and took an Clonazepam with no relief. Finally, as the symptoms progressed, she called EMS. Upon arrival, EMS ECG noted inferior STEMI (0116 hrs). Code STEMI activated & patient transported though ER (0139) to the Cardiac Catheterrization Lab [phone]) for Emergent Cardiac Catheterization with PCI. She was ~7/10 CP on arrival and hemodynamically stable, on Venti mask with SaO2 ~100%. Had received [name] 324 mg & IV Morphine as well as 4000 Units IV Heparin.  Consent: The procedure with Risks/Benefits/Alternatives and Indications was reviewed with the patient.  All questions were answered.  Signed Consent was not obtained due to the Emergent nature of the procedure, however, verbal consent was confrimed and witnessed by Cath Lab staff.  Risks / Complications include, but not limited to: Death, MI, CVA/TIA, VF/VT (with  defibrillation), Bradycardia (need for temporary pacer placement), contrast induced nephropathy, bleeding / bruising / hematoma / pseudoaneurysm, vascular or coronary injury (with possible emergent CT or Vascular Surgery), adverse medication reactions, infection.    Procedure: The patient was brought to the 2nd Floor  Cardiac Catheterization Lab in the fasting state and prepped and draped in the usual sterile fashion for Right groin access. Sterile technique was used including antiseptics, cap, gloves, gown, hand hygiene, mask and sheet.  Skin prep: Chlorhexidine;  Time Out: Verified patient identification, verified procedure, site/side was marked, verified correct patient position, special equipment/implants available, medications/allergies/relevent history reviewed, required imaging and test results available.  Performed  The right femoral head was identified using tactile and fluoroscopic technique.  The right groin was anesthetized with 1% subcutaneous Lidocaine.  The right Common Femoral Artery was accessed using the Modified Seldinger Technique with placement of a antimicrobial bonded/coated single lumen (6 Fr) sheath.  The sheath was aspirated and flushed.    A 6 Fr JL4 followed by JR4 Catheter was advanced of over a Standard Jwire into the ascending Aorta and used to to engage the Left then Right Coronary Arteries.  Multiple cineangiographic views of the Both Coronary Artery system(s) were performed.   Hemodynamics:  Central Aortic Pressure / Mean Aortic Pressure: 127/70 mmHg; 96 mmHg  LV Pressure / LV End diastolic Pressure:  127/16 mmHg; 25 mmHg  Left Ventriculography:  EF:  60-65%  Wall Motion: minimal basal inferior hypokinesis  Coronary Angiographic Data:  Left Main:  Large-caliber vessel, bifurcates into LAD and circumflex; angiographically normal  Left Anterior Descending (LAD):  Large caliber vessel giving rise to several small septal perforators as well as a mid  vessel major diagonal branch and then wrapping around the apex; minimal luminal irregularities.  1st diagonal (D1):  Moderate caliber vessel bifurcates along the lateral wall; minimal luminal irregularities  Circumflex (LCx):  Large-caliber vessel to rise to a proximal moderate to large caliber OM1 that bifurcates reaching toward the anterolateral apex, there is then a second smaller OM 2 after which the vessel is occluded with dye hangup.  1st obtuse marginal:  Moderate to large caliber vessel bifurcates terminally; Minimal luminal irregularities.  2nd obtuse marginal:  Small to moderate caliber vessel; minimal luminal irregularities   Post-PCI: The circumflex beyond the occluded segment then bifurcated OM 3 in the AV groove circumflex/posterior lateral system of 2 small posterior lateral branches.  Right Coronary Artery: Large large-caliber vessel with diffuse 20-30% irregularities proximally to mid portion of which is tapers into a long irregular / eccentric tubular 85% stenosis just prior to the genu, this is followed by two 40-60% lesions with the most distal just prior to bifurcation into a small moderate caliber PDA and very small right posterior lateral system with minimal luminal irregularities.  PERCUTANEOUS CORONARY INTERVENTION PROCEDURE  After reviewing the initial cineangiography images, the culprit lesion(s) was identified, and the decision was made to proceed with percutaneous coronary intervention.  A weight based bolus of IV Angiomax was administered and the drip was continued until completion of the procedure.  Oral Prasugrel 60 mg was administered.  The Guide catheter(s) were advanced over a J-wire and used to engage the left Coronary Artery. An ACT of > 200 Sec was confirmed prior to advancing the Guidewire.  Lesion #1:  Mid Left Circumflex (extending to AV Groove bifurcation into OM3 and PL system.  Pre-PCI Stenosis: 100 % Post-PCI Stenosis: 0 %     TIMI 0 flow        TIMI 3 flow  Guide Catheter: 6Fr. XB3.5  Guidewire: BMW  Pre-Dilitation Balloon: Emerge Monorail 2.5 mm x 12 mm   1st Inflation: 4 Atm for 32 Sec; reperfusion (0214)  Scout angiography did not reveal evidence of dissection or perforation  Stent: Promus Element DES 2.5 mm x 12 mm   Deployment: 12 Atm for 45 Sec  Scout angiography did not reveal evidence of dissection or perforation, but did suggest a lesion proximal to the stent.  Post-Dilitation Balloon: [place] Quantum Apex 2.5 mm x 8 mm   1st Inflation: 15 Atm for 35 Sec  Scout angiography did not reveal evidence of dissection or perforation, but with initial "wire-out angiography" there was a siginificant "step-up" proximal to the stent with a "hazy appearance".  The decision was made to cover this short defect with a second overlapping stent.  Stent #2: Promus Element DES 2.5 mm x 8 mm   Deployment: 14 Atm for 45 Sec; final proximal diameter ~2.75 mm     Post-Dilitation Balloon: [place] Quantum Apex 2.75 mm x 12 mm (at overlap)   1st Inflation: 16 Atm for 45 Sec; final stent overlap & original lesion diameter -- 2.8 mm.    Post-deployment cineangiography with and without guidewire in place was performed in multiple orthogonal views demonstrating excellent stent deployment and did not reveal evidence of dissection or perforation  This catheter was then exchanged over the Standard J wire for an angled Pigtail catheter that was advanced across the Aortic Valve.  LV hemodynamics were measured and Left Ventriculography was performed.  LV hemodynamics were  then re-sampled, and the catheter was pulled back across the Aortic Valve for measurement of "pull-back" gradient.  The catheter and wire were removed completely out of the body.  A Right Common Femoral Angiogram was performed demonstrating acceptable arteriotomy site but a high bifurcation and ~60% ostial Profunda stenosis.  Therefore, the sheath was sewn in place with plan to remove it ~2hrs after  discontinuation of Angiomax with manual pressure for hemostasis.  The patient was transported to the CCU in a chest pain free and hemodynamically stable condition.   The patient  was stable before, during and following the procedure.   Patient did tolerate procedure well. There were not complications. EBL: < 20ml  Medications:  Premedication: 324 mg [name], 4000 Units Heparin IV bolus, IV Morphine 2mg   Sedation:  2 mg IV Versed, 25 IV mcg Fentanyl  Contrast:  155 ml Omnipaque  Anticoagulation: Angiomax Bolus (0.75 mg/kg), and drip (1.75mg /kg/hr) during PCI .  Prasugrel 60 mg  Impression:  Severe 2 vessel CAD with 100% mid Circumflex as the culprit lesion and a ~80% long irregular mid RCA lesion  Successful PCI of the mid Circumflex with 2 overlapping Promus Element DES (2.5 mm x 12 mm, 2.5 mm x 8 mm) - post dilated at the overlap / proximal stented region to 2.8 mm.  Well preserved LVEF, estimated at ~60-65%.  Mild basal inferolateral hypokinesis is suggested.  Plan:  Admit to CCU - would not fast-track as she is extremely anxious and would like consideration of staged RCA PCI prior to discharge.    Will need to discuss pros & cons of early staged PCI, vs. Potential discharge with plan to return within ~3-4 weeks for staged PCI as OP.  [name], Prasugrel for minimum of 1 year.  Statin & BB therapy.  Smoking cessation counseling.  Cardiac Rehab Consult as well as Care Management consult for medication needs.  2D Echo to better assess cardiac function from 3D perspective.  The case and results was discussed with the patient (and family).  Pictures were provided. The case and results was not discussed with the patient's PCP.  Time Spend Directly with Patient:  90 minutes  Thornton Dohrmann W, M.D., M.S. [facility] [address] [place], [place]  [address]  [phone]

## 2011-10-10 NOTE — ED Notes (Addendum)
Pt taken to the cath lab with RR nurse and nurse, report given to cath lab RNs

## 2011-10-10 NOTE — Progress Notes (Signed)
[facility]  Subjective: She feels sore in the chest along the rib line which is different from her chest pain earlier.  Her back is sore also from lying still.  Objective: Vital signs in last 24 hours: Temp:  [97.5 F (36.4 C)-98.3 F (36.8 C)] 98.2 F (36.8 C) (05/09 0736) Pulse Rate:  [63-79] 67  (05/09 0700) Resp:  [12-19] 12  (05/09 0700) BP: (104-131)/(60-84) 113/67 mmHg (05/09 0700) SpO2:  [96 %-100 %] 99 % (05/09 0700) Arterial Line BP: (136-143)/(67-71) 143/68 mmHg (05/09 0445) Weight:  [73.7 kg (162 lb 7.7 oz)] 73.7 kg (162 lb 7.7 oz) (05/09 0400) Last BM Date: 10/09/11  Intake/Output from previous day: 05/08 0701 - 05/09 0700 In: 300 [I.V.:300] Out: 1120 [Urine:1120] Intake/Output this shift:    Medications Current Facility-Administered Medications  Medication Dose Route Frequency Provider Last Rate Last Dose  . 0.9 %  sodium chloride infusion  250 mL Intravenous PRN Marykay Lex, MD      . 0.9 %  sodium chloride infusion   Intravenous Continuous Marykay Lex, MD 75 mL/hr at 10/10/11 0345    . acetaminophen (TYLENOL) tablet 650 mg  650 mg Oral Q4H PRN Marykay Lex, MD      . albuterol (PROVENTIL HFA;VENTOLIN HFA) 108 (90 BASE) MCG/ACT inhaler 2 puff  2 puff Inhalation Q4H PRN Marykay Lex, MD      . alum & mag hydroxide-simeth (MAALOX/MYLANTA) 200-200-20 MG/5ML suspension 30 mL  30 mL Oral Q6H PRN Marykay Lex, MD   30 mL at 10/10/11 0435  . antiseptic oral rinse (BIOTENE) solution 15 mL  15 mL Mouth Rinse BID Marykay Lex, MD      . aspirin chewable tablet 81 mg  81 mg Oral Daily Marykay Lex, MD      . atorvastatin (LIPITOR) tablet 40 mg  40 mg Oral q1800 Marykay Lex, MD      . benzonatate (TESSALON) capsule 100 mg  100 mg Oral TID PRN Marykay Lex, MD      . bivalirudin (ANGIOMAX) 250 MG injection           . clonazePAM (KLONOPIN) tablet 1 mg  1 mg Oral TID PRN Marykay Lex, MD      . fentaNYL  (SUBLIMAZE) 0.05 MG/ML injection           . heparin 2-0.9 UNIT/ML-% infusion           . heparin 5000 UNIT/ML injection        5,000 Units at 10/10/11 0359  . heparin bolus via infusion 4,000 Units  4,000 Units Intravenous Once Ethelda Chick, MD   4,000 Units at 10/10/11 0148  . lidocaine (XYLOCAINE) 1 % injection           . loratadine (CLARITIN) tablet 10 mg  10 mg Oral Daily PRN Marykay Lex, MD      . metoprolol succinate (TOPROL-XL) 24 hr tablet 25 mg  25 mg Oral Daily Marykay Lex, MD      . midazolam (VERSED) 2 MG/2ML injection           . morphine 2 MG/ML injection 2 mg  2 mg Intravenous Q4H PRN Marykay Lex, MD      . nitroGLYCERIN (NTG ON-CALL) 0.2 mg/mL injection           . nitroGLYCERIN 0.2 mg/mL in dextrose 5 % infusion  10 mcg/min Intravenous Once Malva Cogan  Linker, MD 3 mL/hr at 10/10/11 0150 10 mcg/min at 10/10/11 0150  . ondansetron (ZOFRAN) injection 4 mg  4 mg Intravenous Q6H PRN Marykay Lex, MD      . pneumococcal 23 valent vaccine (PNU-IMMUNE) injection 0.5 mL  0.5 mL Intramuscular Tomorrow-1000 Marykay Lex, MD      . prasugrel (EFFIENT) 10 MG tablet           . prasugrel (EFFIENT) tablet 10 mg  10 mg Oral Daily Marykay Lex, MD      . sodium chloride 0.9 % injection 3 mL  3 mL Intravenous Q12H Marykay Lex, MD   3 mL at 10/10/11 0359  . sodium chloride 0.9 % injection 3 mL  3 mL Intravenous PRN Marykay Lex, MD      . DISCONTD: albuterol (PROVENTIL,VENTOLIN) inhaler 2 puff  2 puff Inhalation Q4H PRN Marykay Lex, MD        PE: General appearance: alert, cooperative and no distress Lungs: clear to auscultation bilaterally Heart: regular rate and rhythm, S1, S2 normal, no murmur, click, rub or gallop Abdomen: +BS, nontender. Extremities: No LEE Pulses: 2+ and symmetric  Lab Results:   Basename 10/10/11 0410 10/10/11 0200  WBC 9.4 7.3  HGB 12.9 13.2  HCT 37.6 38.9  PLT 157 182   BMET  Basename 10/10/11 0410 10/10/11 0200  NA 139  141  K 4.0 3.5  CL 110 108  CO2 21 23  GLUCOSE 132* 159*  BUN 14 18  CREATININE 0.55 0.66  CALCIUM 8.4 9.0   PT/INR  Basename 10/10/11 0200  LABPROT 13.5  INR 1.01   Cholesterol No results found for this basename: CHOL in the last 72 hours Cardiac Enzymes No components found with this basename: TROPONIN:3, CKMB:3  Studies/Results: Hemodynamics:  Central Aortic Pressure / Mean Aortic Pressure: 127/70 mmHg; 96 mmHg  LV Pressure / LV End diastolic Pressure: 127/16 mmHg; 25 mmHg  Left Ventriculography:  EF: 60-65%  Wall Motion: minimal basal inferior hypokinesis  Coronary Angiographic Data:  Left Main: Large-caliber vessel, bifurcates into LAD and circumflex; angiographically normal  Left Anterior Descending (LAD): Large caliber vessel giving rise to several small septal perforators as well as a mid vessel major diagonal branch and then wrapping around the apex; minimal luminal irregularities.  1st diagonal (D1): Moderate caliber vessel bifurcates along the lateral wall; minimal luminal irregularities  Circumflex (LCx): Large-caliber vessel to rise to a proximal moderate to large caliber OM1 that bifurcates reaching toward the anterolateral apex, there is then a second smaller OM 2 after which the vessel is occluded with dye hangup.  1st obtuse marginal: Moderate to large caliber vessel bifurcates terminally; Minimal luminal irregularities.  2nd obtuse marginal: Small to moderate caliber vessel; minimal luminal irregularities  Post-PCI: The circumflex beyond the occluded segment then bifurcated OM 3 in the AV groove circumflex/posterior lateral system of 2 small posterior lateral branches.  Right Coronary Artery: Large large-caliber vessel with diffuse 20-30% irregularities proximally to mid portion of which is tapers into a long irregular / eccentric tubular 85% stenosis just prior to the genu, this is followed by two 40-60% lesions with the most distal just prior to bifurcation into  a small moderate caliber PDA and very small right posterior lateral system with minimal luminal irregularities. PERCUTANEOUS CORONARY INTERVENTION PROCEDURE  After reviewing the initial cineangiography images, the culprit lesion(s) was identified, and the decision was made to proceed with percutaneous coronary intervention.  A weight based bolus of  IV Angiomax was administered and the drip was continued until completion of the procedure.  Oral Prasugrel 60 mg was administered.  The Guide catheter(s) were advanced over a J-wire and used to engage the left Coronary Artery.  An ACT of > 200 Sec was confirmed prior to advancing the Guidewire.  Lesion #1: Mid Left Circumflex (extending to AV Groove bifurcation into OM3 and PL system.  Pre-PCI Stenosis: 100 % Post-PCI Stenosis: 0 %  TIMI 0 flow TIMI 3 flow  Guide Catheter: 6Fr. XB3.5 Guidewire: BMW  Pre-Dilitation Balloon: Emerge Monorail 2.5 mm x 12 mm  1st Inflation: 4 Atm for 32 Sec; reperfusion (0214)  Scout angiography did not reveal evidence of dissection or perforation  Stent: Promus Element DES 2.5 mm x 12 mm  Deployment: 12 Atm for 45 Sec  Scout angiography did not reveal evidence of dissection or perforation, but did suggest a lesion proximal to the stent.  Post-Dilitation Balloon: [place] Quantum Apex 2.5 mm x 8 mm  1st Inflation: 15 Atm for 35 Sec  Scout angiography did not reveal evidence of dissection or perforation, but with initial "wire-out angiography" there was a siginificant "step-up" proximal to the stent with a "hazy appearance". The decision was made to cover this short defect with a second overlapping stent.  Stent #2: Promus Element DES 2.5 mm x 8 mm  Deployment: 14 Atm for 45 Sec; final proximal diameter ~2.75 mm  Post-Dilitation Balloon: [place] Quantum Apex 2.75 mm x 12 mm (at overlap)  1st Inflation: 16 Atm for 45 Sec; final stent overlap & original lesion diameter -- 2.8 mm.  Post-deployment cineangiography with and without  guidewire in place was performed in multiple orthogonal views demonstrating excellent stent deployment and did reveal evidence of dissection or perforation  This catheter was then exchanged over the Standard J wire for an angled Pigtail catheter that was advanced across the Aortic Valve. LV hemodynamics were measured and Left Ventriculography was performed. LV hemodynamics were then re-sampled, and the catheter was pulled back across the Aortic Valve for measurement of "pull-back" gradient. The catheter and wire were removed completely out of the body. A Right Common Femoral Angiogram was performed demonstrating acceptable arteriotomy site but a high bifurcation and ~60% ostial Profunda stenosis. Therefore, the sheath was sewn in place with plan to remove it ~2hrs after discontinuation of Angiomax with manual pressure for hemostasis.  The patient was transported to the CCU in a chest pain free and hemodynamically stable condition.  The patient was stable before, during and following the procedure.  Patient did tolerate procedure well.  There were not complications.  EBL: < 20ml  Medications:  Premedication: 324 mg ASA, 4000 Units Heparin IV bolus, IV Morphine 2mg   Sedation: 2 mg IV Versed, 25 IV mcg Fentanyl  Contrast: 155 ml Omnipaque  Anticoagulation: Angiomax Bolus (0.75 mg/kg), and drip (1.75mg /kg/hr) during PCI .  Prasugrel 60 mg  Impression:  Severe 2 vessel CAD with 100% mid Circumflex as the culprit lesion and a ~80% long irregular mid RCA lesion  Successful PCI of the mid Circumflex with 2 overlapping Promus Element DES (2.5 mm x 12 mm, 2.5 mm x 8 mm) - post dilated at the overlap / proximal stented region to 2.8 mm.  Well preserved LVEF, estimated at ~60-65%. Mild basal inferolateral hypokinesis is suggested. Plan:  Admit to CCU - would not fast-track as she is extremely anxious and would like consideration of staged RCA PCI prior to discharge.  Will need to discuss pros &  cons of early  staged PCI, vs. Potential discharge with plan to return within ~3-4 weeks for staged PCI as OP.  ASA, Prasugrel for minimum of 1 year.  Statin & BB therapy.  Smoking cessation counseling.  Cardiac Rehab Consult as well as Care Management consult for medication needs.  2D Echo to better assess cardiac function from 3D perspective. The case and results was discussed with the patient (and family). Pictures were provided.  The case and results was not discussed with the patient's PCP.  Time Spend Directly with Patient:  90 minutes  Jamariah Tony W, M.D., M.S.  [facility]  [address]  [place], [place] [address]  [phone]     Assessment/Plan  Principal Problem:  *ST elevation myocardial infarction (STEMI) of inferior wall -- PCI of mid LCx with 2 overlapping Promus Element DES 2.5 x 12; 2.5 x 8 (post-dilated prox to 2.8 mm) Active Problems:  ANXIETY  NICOTINE ADDICTION - Tobacco Abuse, long standing  DEPRESSION  SEIZURE DISORDER  Presence of drug coated stent in left circumflex coronary artery - Promus DES x2 (2.5 mm x 12 mm, 2.5 mm x 8mm)  CAD (coronary artery disease), native coronary artery - Post MI-PCI, remaining mid RCA 80-90% lesion  Plan:  S/P Successful PCI of the mid Circumflex with 2 overlapping Promus Element DES (2.5 mm x 12 mm, 2.5 mm x 8 mm) - post dilated at the overlap / proximal stented region to 2.8 mm.  Well preserved LVEF, estimated at ~60-65%. Mild basal inferolateral hypokinesis is suggested.  Likely needs staged PCI to RCA.  Lipid panel, TSH pending.  2D echo pending.    LOS: 0 days    HAGER,BRYAN W 10/10/2011 8:14 AM  I have seen & examined the patient today.  She is doing well s/p STEMI PCI.  Her RCA lesion is significant, but not critical.   We will continue to titrate BB dose as tolerated. On ASA/Prasugrel along with statin.  We will monitor her in the CCU overnight & transfer to telemetry tomorrow.   She  is planning to walk later on tonight.  After reviewing her anatomy, I feel that it is best to plan staged RCA PCI as an outpatient to allow for recovery from this Inferolateral MI.  Marykay Lex, M.D., M.S. [facility] [address] [place], [place]  [address]  [phone] Pager # [phone]  10/10/2011 5:27 PM

## 2011-10-10 NOTE — ED Notes (Signed)
MD at bedside. 

## 2011-10-10 NOTE — Progress Notes (Signed)
CARDIAC REHAB PHASE I   PRE:  Rate/Rhythm: 61 SR    BP: sitting 104/49    SaO2: 97 RA  MODE:  Ambulation: to BR   POST:  Rate/Rhythm: 71 SR    BP: sitting 101/61     SaO2: 97 RA  Pt groggy upon entering but then wanted to go to BR. Assisted pt. C/o dizziness upon standing but this subsided. To recliner. VSS. Left ed materials for pt to look at.  [id number]  Harriet Masson CES, ACSM
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
## 2011-10-10 NOTE — Progress Notes (Signed)
At 0455 RT femoral 6 fr sheath removed and manual pressure applied . At 0515 bleeding noted from insertion site as manual pressure eased off- additional pressure held. Pulses palatable in RT foot during entire procedure. Patient given instructions prior to sheath removal and verbalized understanding. Patient's husband Maisie Fus was given same instructions and verbalized understanding. At 0545-manual pressure released. No bleeding or swelling noted at site. Level zero. Distal pulses present. Patient asleep in bed. 4x4 dressing applied.

## 2011-10-10 NOTE — Progress Notes (Signed)
  Echocardiogram 2D Echocardiogram has been performed.  Cathie Beams Deneen 10/10/2011, 8:58 AM
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
## 2011-10-10 NOTE — Progress Notes (Signed)
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  Spiritual Encounters  Spiritual Needs Emotional  Stress Factors  Patient Stress Factors Exhausted (Pt cares for mother (82, wheelchair-bound) in her home.)  Family Stress Factors Major life changes (Family has to find and move to new home by 6/2.)    Responded to code stemi in ED, seeing pt briefly and providing care to [facility], her partner of 13 years, while pt in cath lab.  Per Elijah Birk, Ms Alcaide has hx seizures (he has seen one in 13 years; apparently she another once while he was out of state) and has recently complained of chest tightness, passing out and complaining of major pain tonight.  Family deals with significant stress related to impending move (dissatisfaction with landlord), care for pt's mother in their home, Tom's switching from 2nd back to 1st shift (harder for him to help with pm caregiving roles, including turning pt's mother every two hours to protect her skin).  Provided logistical and pastoral support to [facility] in cath lab waiting.  Per Elijah Birk, he prefers quiet to clear his mind.  He is aware of ongoing chaplain availability.  Will refer to day chaplain for follow-up care.  Avis Epley, [place] Chaplain [phone]

## 2011-10-10 NOTE — ED Notes (Signed)
Pt woke up having sharp chest pain, pt called EMS and pt found to be having a STEMI. Pt took ASA at home and given two NTG from EMS

## 2011-10-10 NOTE — Consult Note (Signed)
Pt smokes up to 2 1/2 ppd. She is in action stage and needs help. She's right now undecided about wether to use the nicotrol inhaler or the patch. Discussed and explained both these med aids, how they work, side effects, etc. Pt to think about which one she plans to use. In the meantime referred pt to 1-800 quit now for f/u and support. Discussed oral fixation substitutes, second hand smoke and in home smoking policy. Reviewed and gave pt Written education/contact information.

## 2011-10-11 LAB — HEPATIC FUNCTION PANEL
ALT: 18 U/L (ref 0–35)
AST: 59 U/L — ABNORMAL HIGH (ref 0–37)
Albumin: 3.1 g/dL — ABNORMAL LOW (ref 3.5–5.2)
Alkaline Phosphatase: 64 U/L (ref 39–117)
Bilirubin, Direct: <0.1 mg/dL (ref 0.0–0.3)
Total Bilirubin: 0.2 mg/dL — ABNORMAL LOW (ref 0.3–1.2)
Total Protein: 5.4 g/dL — ABNORMAL LOW (ref 6.0–8.3)

## 2011-10-11 LAB — TSH: TSH: 1.896 u[IU]/mL (ref 0.350–4.500)

## 2011-10-11 LAB — LIPID PANEL
Cholesterol: 146 mg/dL (ref 0–200)
HDL: 32 mg/dL — ABNORMAL LOW (ref >39)
LDL Cholesterol: 96 mg/dL (ref 0–99)
Total CHOL/HDL Ratio: 4.6 RATIO
Triglycerides: 90 mg/dL (ref <150)
VLDL: 18 mg/dL (ref 0–40)

## 2011-10-11 LAB — PRO B NATRIURETIC PEPTIDE: Pro B Natriuretic peptide (BNP): 925.8 pg/mL — ABNORMAL HIGH (ref 0–125)

## 2011-10-11 MED ORDER — ISOSORBIDE MONONITRATE ER 30 MG PO TB24
30.0000 mg | ORAL_TABLET | Freq: Every day | ORAL | Status: DC
  Administered 2011-10-11 – 2011-10-12 (×3): 30 mg via ORAL
  Filled 2011-10-11 (×3): qty 1

## 2011-10-11 MED ORDER — PHENOL 1.4 % MT LIQD
1.0000 | OROMUCOSAL | Status: DC | PRN
  Administered 2011-10-11: 1 via OROMUCOSAL
  Filled 2011-10-11: qty 177

## 2011-10-11 MED FILL — Dextrose Inj 5%: INTRAVENOUS | Qty: 50 | Status: AC

## 2011-10-11 NOTE — Progress Notes (Signed)
CARDIAC REHAB PHASE I   PRE:  Rate/Rhythm: 66 SR  BP:  Supine:   Sitting: 114/72  Standing:    SaO2: 98 RA  MODE:  Ambulation: 760 ft   POST:  Rate/Rhythem: 88 SR  BP:  Supine:   Sitting: 120/72  Standing:     SaO2: 98 RA 1335-1500 Tolerated ambulation well. Gait steady, VS stable. Pt c/o of throat fullness, sore throat before walk. Pt states it was unchanged with walking. Completed MI education with pt. She is anxious about her situation and fearful she is going to have another heart attack. Gave pt reassurance and emotional support. Pt agrees to Outp. CRP in [place] after second intervention.   Michele [name]

## 2011-10-11 NOTE — Progress Notes (Signed)
[facility]  Subjective: Chest is sore.  Throat feels Big/full/sore.  Objective: Vital signs in last 24 hours: Temp:  [97.3 F (36.3 C)-98.7 F (37.1 C)] 98.2 F (36.8 C) (05/10 0727) Pulse Rate:  [60-79] 75  (05/10 0727) Resp:  [15-20] 16  (05/10 0400) BP: (92-123)/(49-70) 118/63 mmHg (05/10 0700) SpO2:  [91 %-100 %] 95 % (05/10 0727) Last BM Date: 10/09/11  Intake/Output from previous day: 05/09 0701 - 05/10 0700 In: 1185 [P.O.:960; I.V.:225] Out: 600 [Urine:600] Intake/Output this shift:    Medications Current Facility-Administered Medications  Medication Dose Route Frequency Provider Last Rate Last Dose  . 0.9 %  sodium chloride infusion  250 mL Intravenous PRN Marykay Lex, MD      . 0.9 %  sodium chloride infusion   Intravenous Continuous Marykay Lex, MD      . acetaminophen (TYLENOL) tablet 650 mg  650 mg Oral Q4H PRN Marykay Lex, MD      . albuterol (PROVENTIL HFA;VENTOLIN HFA) 108 (90 BASE) MCG/ACT inhaler 2 puff  2 puff Inhalation Q4H PRN Marykay Lex, MD      . alum & mag hydroxide-simeth (MAALOX/MYLANTA) 200-200-20 MG/5ML suspension 30 mL  30 mL Oral Q6H PRN Marykay Lex, MD   30 mL at 10/10/11 0435  . antiseptic oral rinse (BIOTENE) solution 15 mL  15 mL Mouth Rinse BID Marykay Lex, MD   15 mL at 10/10/11 0800  . aspirin chewable tablet 81 mg  81 mg Oral Daily Marykay Lex, MD   81 mg at 10/10/11 0956  . atorvastatin (LIPITOR) tablet 40 mg  40 mg Oral q1800 Marykay Lex, MD   40 mg at 10/10/11 1825  . benzonatate (TESSALON) capsule 100 mg  100 mg Oral TID PRN Marykay Lex, MD      . clonazePAM Scarlette Calico) tablet 1 mg  1 mg Oral TID PRN Marykay Lex, MD   1 mg at 10/10/11 1825  . loratadine (CLARITIN) tablet 10 mg  10 mg Oral Daily PRN Marykay Lex, MD      . metoprolol succinate (TOPROL-XL) 24 hr tablet 25 mg  25 mg Oral Daily Marykay Lex, MD   25 mg at 10/10/11 0957  . morphine 2 MG/ML  injection 2 mg  2 mg Intravenous Q4H PRN Marykay Lex, MD      . ondansetron [facility]) injection 4 mg  4 mg Intravenous Q6H PRN Marykay Lex, MD      . pantoprazole (PROTONIX) EC tablet 40 mg  40 mg Oral BID AC Marykay Lex, MD   40 mg at 10/10/11 1643  . pneumococcal 23 valent vaccine (PNU-IMMUNE) injection 0.5 mL  0.5 mL Intramuscular Tomorrow-1000 Marykay Lex, MD      . prasugrel (EFFIENT) tablet 10 mg  10 mg Oral Daily Marykay Lex, MD   10 mg at 10/10/11 0957  . sodium chloride 0.9 % injection 3 mL  3 mL Intravenous Q12H Marykay Lex, MD   3 mL at 10/10/11 2200  . sodium chloride 0.9 % injection 3 mL  3 mL Intravenous PRN Marykay Lex, MD        PE: General appearance: alert, cooperative and no distress Lungs: clear to auscultation bilaterally Heart: regular rate and rhythm, S1, S2 normal, no murmur, click, rub or gallop Extremities: No LEE.  Fingers and hands slightly swollen. Pulses: 2+ and symmetric  Lab Results:  Basename 10/10/11 0410 10/10/11 0208 10/10/11 0201 10/10/11 0200  WBC 9.4 -- -- 7.3  HGB 12.9 11.9* 12.9 --  HCT 37.6 35.0* 38.0 --  PLT 157 -- -- 182   BMET  Basename 10/10/11 0410 10/10/11 0208 10/10/11 0201 10/10/11 0200  NA 139 143 141 --  K 4.0 3.2* 3.6 --  CL 110 109 107 --  CO2 21 -- -- 23  GLUCOSE 132* 158* 160* --  BUN 14 17 19  --  CREATININE 0.55 0.50 0.70 --  CALCIUM 8.4 -- -- 9.0   PT/INR  Basename 10/10/11 0200  LABPROT 13.5  INR 1.01   Cholesterol  Basename 10/11/11 0505  CHOL 146   Cardiac Enzymes  Studies/Results: Study Conclusions  - Left ventricle: The cavity size was normal. Wall thickness was normal. Systolic function was at the lower limits of normal. The estimated ejection fraction was in the range of 50% to 55%. Hypokinesis of the basal-midinferolateral myocardium. Left ventricular diastolic function parameters were normal. - Mitral valve: Mild regurgitation directed centrally  Assessment/Plan     Principal Problem:  *ST elevation myocardial infarction (STEMI) of inferior wall -- PCI of mid LCx with 2 overlapping Promus Element DES 2.5 x 12; 2.5 x 8 (post-dilated prox to 2.8 mm) Active Problems:  ANXIETY  NICOTINE ADDICTION - Tobacco Abuse, long standing  DEPRESSION  SEIZURE DISORDER  Presence of drug coated stent in left circumflex coronary artery - Promus DES x2 (2.5 mm x 12 mm, 2.5 mm x 8mm)  CAD (coronary artery disease), native coronary artery - Post MI-PCI, remaining mid RCA 80-90% lesion  Plan:  S/P Successful PCI of the mid Circumflex with 2 overlapping Promus Element DES (2.5 mm x 12 mm, 2.5 mm x 8 mm).  BP/HR controlled and stable.  ASA/statin/Toprol XL/Effient/protonix.  Will add chloraseptic spray for throat.  Transfer to Tele.  OP Cardiac rehab.  Staged PCI to RCA as out patient.   LOS: 1 day    HAGER,BRYAN W 10/11/2011 7:46 AM  ATTENDING ATTESTATION:  I have seen and examined the patient along with Wilburt Finlay, PA.  I have reviewed the chart, notes and new data.  I agree with Bryan's note.  Brief Description: 56 y/o woman s/p InfLat STEMI with mid LCx occlusio - treated with 2 overlapping DES stents (Cx gives off several LPL vessels & OM after stent), also has ~80-90% mid RCA lesion that I suspect is more chronic.    Cardiac markers trending down - will get d/c troponin level  Echo -EF 50-55% with Hypokinesis of the basal-midinferolateral myocardium.  BP stable & exam normal; cath site c/d/i  Tolerated walk yesterday afternoon  PLAN:  Transfer to tele - and likely d/c tomorrow  Tolerated BB dose so far - has only had 1-2 doses, so not at steady state.  Will keep @ current dose.  With low normal EF, will want to add ACE-I/ARB as OP if BP tolerates, but after titrating BB dose up 1st.  No SSx of CHF  Agree with adding nitrate   Will see in f/u in ~1-2 weeks to discuss plans re RCA revascularization & medication adjustment.  Marykay Lex, M.D.,  M.S. [facility] [address] [place], [place]  [address]  [phone]  10/11/2011 8:24 AM

## 2011-10-12 LAB — CARDIAC PANEL(CRET KIN+CKTOT+MB+TROPI)
CK, MB: 7.1 ng/mL (ref 0.3–4.0)
Relative Index: INVALID (ref 0.0–2.5)
Total CK: 97 U/L (ref 7–177)
Troponin I: 9.78 ng/mL (ref <0.30)

## 2011-10-12 LAB — BASIC METABOLIC PANEL
BUN: 13 mg/dL (ref 6–23)
CO2: 25 mEq/L (ref 19–32)
Calcium: 9.6 mg/dL (ref 8.4–10.5)
Chloride: 107 mEq/L (ref 96–112)
Creatinine, Ser: 0.64 mg/dL (ref 0.50–1.10)
GFR calc Af Amer: >90 mL/min (ref >90)
GFR calc non Af Amer: >90 mL/min (ref >90)
Glucose, Bld: 112 mg/dL — ABNORMAL HIGH (ref 70–99)
Potassium: 4.2 mEq/L (ref 3.5–5.1)
Sodium: 143 mEq/L (ref 135–145)

## 2011-10-12 LAB — CBC
HCT: 38.7 % (ref 36.0–46.0)
Hemoglobin: 13.1 g/dL (ref 12.0–15.0)
MCH: 29.6 pg (ref 26.0–34.0)
MCHC: 33.9 g/dL (ref 30.0–36.0)
MCV: 87.6 fL (ref 78.0–100.0)
Platelets: 184 10*3/uL (ref 150–400)
RBC: 4.42 MIL/uL (ref 3.87–5.11)
RDW: 13.3 % (ref 11.5–15.5)
WBC: 7.5 10*3/uL (ref 4.0–10.5)

## 2011-10-12 LAB — MAGNESIUM: Magnesium: 2.1 mg/dL (ref 1.5–2.5)

## 2011-10-12 MED ORDER — ASPIRIN 81 MG PO CHEW: 81.0000 mg | CHEWABLE_TABLET | Freq: Every day | ORAL | Status: DC

## 2011-10-12 MED ORDER — ISOSORBIDE MONONITRATE ER 30 MG PO TB24: 30.0000 mg | ORAL_TABLET | Freq: Every day | ORAL | Status: DC

## 2011-10-12 MED ORDER — PRASUGREL HCL 10 MG PO TABS: 10.0000 mg | ORAL_TABLET | Freq: Every day | ORAL | Status: DC

## 2011-10-12 MED ORDER — PHENOL 1.4 % MT LIQD: 1.0000 | OROMUCOSAL | Status: DC | PRN

## 2011-10-12 MED ORDER — ACETAMINOPHEN 325 MG PO TABS: 650.0000 mg | ORAL_TABLET | ORAL | Status: DC | PRN

## 2011-10-12 MED ORDER — OMEPRAZOLE 20 MG PO CPDR: 20.0000 mg | DELAYED_RELEASE_CAPSULE | Freq: Two times a day (BID) | ORAL | Status: DC

## 2011-10-12 MED ORDER — ATORVASTATIN CALCIUM 40 MG PO TABS: 40.0000 mg | ORAL_TABLET | Freq: Every day | ORAL | Status: DC

## 2011-10-12 MED ORDER — METOPROLOL SUCCINATE ER 25 MG PO TB24: 25.0000 mg | ORAL_TABLET | Freq: Every day | ORAL | Status: DC

## 2011-10-12 MED ORDER — NITROGLYCERIN 0.4 MG SL SUBL: 0.4000 mg | SUBLINGUAL_TABLET | SUBLINGUAL | Status: DC | PRN

## 2011-10-12 NOTE — Progress Notes (Signed)
Pt. Seen and examined. Agree with the NP/PA-C note as written.  No ongoing chest pain s/p circumflex intervention for STEMI. Residual disease in the RCA will be addressed in 1-2 weeks. Stable for discharge today. Wait on rehabilitation until after complete revascularization. Would like omeprazole on d/c, cannot afford protonix with her insurance. Also ASA 81 mg, lipitor 40 mg, imdur 30 mg, toprol XL 25 mg daily, and prasugrel 10 mg daily.  Chrystie Nose, MD, [facility] Attending Cardiologist [facility]

## 2011-10-12 NOTE — Progress Notes (Signed)
[facility]  Subjective: Complaining of SOB all the time.  Left thigh pain which came and went.  Fullness in her neck.  Objective: Vital signs in last 24 hours: Temp:  [97.7 F (36.5 C)-98.1 F (36.7 C)] 97.9 F (36.6 C) (05/11 0630) Pulse Rate:  [67-71] 68  (05/11 0630) Resp:  [18] 18  (05/11 0630) BP: (100-132)/(59-77) 113/65 mmHg (05/11 0630) SpO2:  [95 %-99 %] 99 % (05/11 0630) Last BM Date: 10/09/11  Intake/Output from previous day: 05/10 0701 - 05/11 0700 In: 240 [P.O.:240] Out: -  Intake/Output this shift:    Medications Current Facility-Administered Medications  Medication Dose Route Frequency Provider Last Rate Last Dose  . 0.9 %  sodium chloride infusion  250 mL Intravenous PRN Marykay Lex, MD      . acetaminophen (TYLENOL) tablet 650 mg  650 mg Oral Q4H PRN Marykay Lex, MD      . albuterol (PROVENTIL HFA;VENTOLIN HFA) 108 (90 BASE) MCG/ACT inhaler 2 puff  2 puff Inhalation Q4H PRN Marykay Lex, MD      . alum & mag hydroxide-simeth (MAALOX/MYLANTA) 200-200-20 MG/5ML suspension 30 mL  30 mL Oral Q6H PRN Marykay Lex, MD   30 mL at 10/10/11 0435  . antiseptic oral rinse (BIOTENE) solution 15 mL  15 mL Mouth Rinse BID Marykay Lex, MD   15 mL at 10/12/11 0857  . aspirin chewable tablet 81 mg  81 mg Oral Daily Marykay Lex, MD   81 mg at 10/12/11 0947  . atorvastatin (LIPITOR) tablet 40 mg  40 mg Oral q1800 Marykay Lex, MD   40 mg at 10/11/11 1701  . benzonatate (TESSALON) capsule 100 mg  100 mg Oral TID PRN Marykay Lex, MD      . clonazePAM Scarlette Calico) tablet 1 mg  1 mg Oral TID PRN Marykay Lex, MD   1 mg at 10/12/11 0910  . isosorbide mononitrate (IMDUR) 24 hr tablet 30 mg  30 mg Oral Daily Dwana Melena, PA   30 mg at 10/12/11 1610  . loratadine (CLARITIN) tablet 10 mg  10 mg Oral Daily PRN Marykay Lex, MD      . metoprolol succinate (TOPROL-XL) 24 hr tablet 25 mg  25 mg Oral Daily Marykay Lex, MD    25 mg at 10/12/11 9604  . morphine 2 MG/ML injection 2 mg  2 mg Intravenous Q4H PRN Marykay Lex, MD      . ondansetron [facility]) injection 4 mg  4 mg Intravenous Q6H PRN Marykay Lex, MD      . pantoprazole (PROTONIX) EC tablet 40 mg  40 mg Oral BID AC Marykay Lex, MD   40 mg at 10/12/11 0856  . phenol (CHLORASEPTIC) mouth spray 1 spray  1 spray Mouth/Throat PRN Dwana Melena, PA   1 spray at 10/11/11 0933  . pneumococcal 23 valent vaccine (PNU-IMMUNE) injection 0.5 mL  0.5 mL Intramuscular Tomorrow-1000 Marykay Lex, MD      . prasugrel (EFFIENT) tablet 10 mg  10 mg Oral Daily Marykay Lex, MD   10 mg at 10/12/11 [phone]  . sodium chloride 0.9 % injection 3 mL  3 mL Intravenous Q12H Marykay Lex, MD   3 mL at 10/11/11 2102  . sodium chloride 0.9 % injection 3 mL  3 mL Intravenous PRN Marykay Lex, MD      . DISCONTD: 0.9 %  sodium chloride infusion   Intravenous Continuous Marykay Lex, MD        PE: General appearance: alert, cooperative and Anxious Neck: no adenopathy, no carotid bruit, supple, symmetrical, trachea midline, thyroid not enlarged, symmetric, no tenderness/mass/nodules and +exacerbation of pain/fullness with active movement against resistance. Lungs: clear to auscultation bilaterally Heart: regular rate and rhythm, S1, S2 normal, no murmur, click, rub or gallop Extremities: No LEE Pulses: 2+ and symmetric  Lab Results:   Basename 10/12/11 0650 10/10/11 0410 10/10/11 0208 10/10/11 0200  WBC 7.5 9.4 -- 7.3  HGB 13.1 12.9 11.9* --  HCT 38.7 37.6 35.0* --  PLT 184 157 -- 182   BMET  Basename 10/12/11 0650 10/10/11 0410 10/10/11 0208 10/10/11 0200  NA 143 139 143 --  K 4.2 4.0 3.2* --  CL 107 110 109 --  CO2 25 21 -- 23  GLUCOSE 112* 132* 158* --  BUN 13 14 17  --  CREATININE 0.64 0.55 0.50 --  CALCIUM 9.6 8.4 -- 9.0   PT/INR  Basename 10/10/11 0200  LABPROT 13.5  INR 1.01   Cholesterol  Basename 10/11/11 0505  CHOL 146   Cardiac  Enzymes  Studies/Results:   Assessment/Plan  Principal Problem:  *ST elevation myocardial infarction (STEMI) of inferior wall -- PCI of mid LCx with 2 overlapping Promus Element DES 2.5 x 12; 2.5 x 8 (post-dilated prox to 2.8 mm) Active Problems:  ANXIETY  NICOTINE ADDICTION - Tobacco Abuse, long standing  DEPRESSION  SEIZURE DISORDER  Presence of drug coated stent in left circumflex coronary artery - Promus DES x2 (2.5 mm x 12 mm, 2.5 mm x 8mm)  CAD (coronary artery disease), native coronary artery - Post MI-PCI, remaining mid RCA 80-90% lesion  Plan:  56 y/o woman s/p InfLat STEMI with mid LCx occlusio - treated with 2 overlapping DES stents (Cx gives off several LPL vessels & OM after stent), also has ~80-90% mid RCA lesion that I suspect is more chronic.  Cardiac enzymes trending down.  Last was 9.   Plan to see Dr. [name] in 1-2 weeks to discuss PCI to RCA as OP.  ASA/ Effient/statin/toprol XL/imdur/protonix.    O2 Sats 95-97% with ambulation.  BNP was elevated ~900 but no signs of CHF.  She has a history of anxiety which is likely playing a role in her current symptoms.  Will DC will SL NTG.    LOS: 2 days    Leovanni Bjorkman W 10/12/2011 10:21 AM

## 2011-10-12 NOTE — Discharge Summary (Signed)
Lyriq Jarchow C. Ikhlas Albo, MD, FACC Attending Cardiologist [facility]  

## 2011-10-12 NOTE — Progress Notes (Signed)
Patient is expressing increased anxiety about having another MI. She is also worried that her IV site is infected because it is red and tender. IV removed and border drawn around parameter of redness. Also, the patient is worried about her swollen lymph nodes in her neck and thinks it may be related to her MI. MD on call for [facility] paged and notified. Orders given for new lab draws in the AM. Patient was reassured that we are monitoring her closely and told the symptoms of a MI. She was also medicated, which seemed to help. Will continue to monitor and reassure patient as needed.  Harless Litten, RN 10/11/11

## 2011-10-12 NOTE — Discharge Summary (Signed)
Physician Discharge Summary  Patient ID: Michele [name] MRN: [id number] DOB/AGE: May 17, 1956 56 y.o.  Admit date: 10/10/2011 Discharge date: 10/12/2011  Admission Diagnoses:  STEMI  Discharge Diagnoses:  Principal Problem:  *ST elevation myocardial infarction (STEMI) of inferior wall -- PCI of mid LCx with 2 overlapping Promus Element DES 2.5 x 12; 2.5 x 8 (post-dilated prox to 2.8 mm) Active Problems:  ANXIETY  NICOTINE ADDICTION - Tobacco Abuse, long standing  DEPRESSION  SEIZURE DISORDER  Presence of drug coated stent in left circumflex coronary artery - Promus DES x2 (2.5 mm x 12 mm, 2.5 mm x 8mm)  CAD (coronary artery disease), native coronary artery - Post MI-PCI, remaining mid RCA 80-90% lesion   Discharged Condition: stable  Hospital Course:   Michele [name] is a 56 y.o. 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  She was doing well until ~2300 last PM (10/09/11) when she began feeling left sided chest pressure and shortness of breath. She got up to drink something and took an Clonazepam with no relief. Finally, as the symptoms progressed, she called EMS. Upon arrival, EMS ECG noted inferior STEMI (0116 hrs). Code STEMI activated & patient transported though ER to the Cardiac Catheterrization Lab for Emergent Cardiac Catheterization with PCI. She was ~7/10 CP on arrival and hemodynamically stable, on Venti mask with SaO2 ~100%. Had received ASA 324 mg & IV Morphine as well as 4000 Units IV Heparin.   The patient reported chest tightness which was 10/10, and was preceded earlier in the day with tightness in her left arm. The pain radiated to her neck. She also reports nausea, and syncopal episode prior to EMS arrival. She was, however, lying on the couch during the episode. The length of time she was out is unknown. She denies Cough, vomiting, congestion, fever, ST,  abdominal pain, LEE, dysuria, hematuria, hematochezia, melena.  Cardiac catheterization showed Severe 2 vessel CAD with 100% mid Circumflex as the culprit lesion and a ~80% long irregular mid RCA lesion Successful PCI of the mid Circumflex with 2 overlapping Promus Element DES (2.5 mm x 12 mm, 2.5 mm x 8 mm) - post dilated at the overlap / proximal stented region to 2.8 mm. Well preserved LVEF, estimated at ~60-65%. Mild basal inferolateral hypokinesis is suggested.  Cardiac enzymes trending down. Tobacco cessation consult was completed. Patient was started on aspirin, effient, statin, beta blocker.  2-D echocardiogram was completed (See results below). Patient has been seen by Dr. Rennis Golden who feels she is stable for discharge home.  The patient will followup with Dr. [name] in approximately one to 2 weeks to discuss PCI to the right coronary artery as an outpatient procedure.  Consults: Tobacco Cessation  Significant Diagnostic Studies:   CARDIAC CATHETERIZATION REPORT  NAME: Michele [name] MRN: [id number]  DATE OF BIRTH: September 26, 1955 ADMIT DATE: 10/10/2011  Performing Cardiologist: Marykay Lex, M.D., M.S.  Primary Physician: Rene Paci, MD, MD  Primary Cardiologist: Marykay Lex, MD, MS  Procedures Performed:  Left Heart Catheterization via 6 Fr Right Common Femoral Artery access  Left Ventriculography, (RAO) 12 ml/sec for 25 ml total contrast  Native Coronary Angiography  IC NTG Injection 200 mcg x 2  Percutaneous Coronary Artery Intervention on mid Circumflex with 2 overlapping Promus Element DES 2.5 mm x 12 mm (distal) and 2.5 mm x 8 mm proximal; final diameter 2.8 mm proximal.  Indication(s): Inferior STEMI  History: 56 y.o. 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  She was doing well until ~2300 last PM (10/09/11) when she began feeling left sided chest pressure  and shortness of breath. She got up to drink something and took an Clonazepam with no relief. Finally, as the symptoms progressed, she called EMS. Upon arrival, EMS ECG noted inferior STEMI (0116 hrs). Code STEMI activated & patient transported though ER (0139) to the Cardiac Catheterrization Lab [phone]) for Emergent Cardiac Catheterization with PCI. She was ~7/10 CP on arrival and hemodynamically stable, on Venti mask with SaO2 ~100%. Had received ASA 324 mg & IV Morphine as well as 4000 Units IV Heparin.  Consent: The procedure with Risks/Benefits/Alternatives and Indications was reviewed with the patient. All questions were answered. Signed Consent was not obtained due to the Emergent nature of the procedure, however, verbal consent was confrimed and witnessed by Cath Lab staff.  Risks / Complications include, but not limited to: Death, MI, CVA/TIA, VF/VT (with defibrillation), Bradycardia (need for temporary pacer placement), contrast induced nephropathy, bleeding / bruising / hematoma / pseudoaneurysm, vascular or coronary injury (with possible emergent CT or Vascular Surgery), adverse medication reactions, infection.  Procedure: The patient was brought to the 2nd Floor [facility]ter[facility] Lab in the fasting state and prepped and draped in the usual sterile fashion for Right groin access. Sterile technique was used including antiseptics, cap, gloves, gown, hand hygiene, mask and sheet.  Skin prep: Chlorhexidine;  Time Out: Verified patient identification, verified procedure, site/side was marked, verified correct patient position, special equipment/implants available, medications/allergies/relevent history reviewed, required imaging and test results available. Performed  The right femoral head was identified using tactile and fluoroscopic technique. The right groin was anesthetized with 1% subcutaneous Lidocaine. The right Common Femoral Artery was accessed using the Modified Seldinger  Technique with placement of a antimicrobial bonded/coated single lumen (6 Fr) sheath. The sheath was aspirated and flushed.  A 6 Fr JL4 followed by JR4 Catheter was advanced of over a Standard Jwire into the ascending Aorta and used to to engage the Left then Right Coronary Arteries. Multiple cineangiographic views of the Both Coronary Artery system(s) were performed.  Hemodynamics:  Central Aortic Pressure / Mean Aortic Pressure: 127/70 mmHg; 96 mmHg  LV Pressure / LV End diastolic Pressure: 127/16 mmHg; 25 mmHg  Left Ventriculography:  EF: 60-65%  Wall Motion: minimal basal inferior hypokinesis  Coronary Angiographic Data:  Left Main: Large-caliber vessel, bifurcates into LAD and circumflex; angiographically normal  Left Anterior Descending (LAD): Large caliber vessel giving rise to several small septal perforators as well as a mid vessel major diagonal branch and then wrapping around the apex; minimal luminal irregularities.  1st diagonal (D1): Moderate caliber vessel bifurcates along the lateral wall; minimal luminal irregularities  Circumflex (LCx): Large-caliber vessel to rise to a proximal moderate to large caliber OM1 that bifurcates reaching toward the anterolateral apex, there is then a second smaller OM 2 after which the vessel is occluded with dye hangup.  1st obtuse marginal: Moderate to large caliber vessel bifurcates terminally; Minimal luminal irregularities.  2nd obtuse marginal: Small to moderate caliber vessel; minimal luminal irregularities  Post-PCI: The circumflex beyond the occluded segment then bifurcated OM 3 in the AV groove circumflex/posterior lateral system of 2 small posterior lateral branches.  Right Coronary Artery: Large large-caliber vessel with diffuse  20-30% irregularities proximally to mid portion of which is tapers into a long irregular / eccentric tubular 85% stenosis just prior to the genu, this is followed by two 40-60% lesions with the most distal just prior  to bifurcation into a small moderate caliber PDA and very small right posterior lateral system with minimal luminal irregularities. PERCUTANEOUS CORONARY INTERVENTION PROCEDURE  After reviewing the initial cineangiography images, the culprit lesion(s) was identified, and the decision was made to proceed with percutaneous coronary intervention.  A weight based bolus of IV Angiomax was administered and the drip was continued until completion of the procedure.  Oral Prasugrel 60 mg was administered.  The Guide catheter(s) were advanced over a J-wire and used to engage the left Coronary Artery.  An ACT of > 200 Sec was confirmed prior to advancing the Guidewire.  Lesion #1: Mid Left Circumflex (extending to AV Groove bifurcation into OM3 and PL system.  Pre-PCI Stenosis: 100 % Post-PCI Stenosis: 0 %  TIMI 0 flow TIMI 3 flow  Guide Catheter: 6Fr. XB3.5 Guidewire: BMW  Pre-Dilitation Balloon: Emerge Monorail 2.5 mm x 12 mm  1st Inflation: 4 Atm for 32 Sec; reperfusion (0214)  Scout angiography did not reveal evidence of dissection or perforation  Stent: Promus Element DES 2.5 mm x 12 mm  Deployment: 12 Atm for 45 Sec  Scout angiography did not reveal evidence of dissection or perforation, but did suggest a lesion proximal to the stent.  Post-Dilitation Balloon: [place] Quantum Apex 2.5 mm x 8 mm  1st Inflation: 15 Atm for 35 Sec  Scout angiography did not reveal evidence of dissection or perforation, but with initial "wire-out angiography" there was a siginificant "step-up" proximal to the stent with a "hazy appearance". The decision was made to cover this short defect with a second overlapping stent.  Stent #2: Promus Element DES 2.5 mm x 8 mm  Deployment: 14 Atm for 45 Sec; final proximal diameter ~2.75 mm  Post-Dilitation Balloon: [place] Quantum Apex 2.75 mm x 12 mm (at overlap)  1st Inflation: 16 Atm for 45 Sec; final stent overlap & original lesion diameter -- 2.8 mm.  Post-deployment cineangiography  with and without guidewire in place was performed in multiple orthogonal views demonstrating excellent stent deployment and did reveal evidence of dissection or perforation  This catheter was then exchanged over the Standard J wire for an angled Pigtail catheter that was advanced across the Aortic Valve. LV hemodynamics were measured and Left Ventriculography was performed. LV hemodynamics were then re-sampled, and the catheter was pulled back across the Aortic Valve for measurement of "pull-back" gradient. The catheter and wire were removed completely out of the body. A Right Common Femoral Angiogram was performed demonstrating acceptable arteriotomy site but a high bifurcation and ~60% ostial Profunda stenosis. Therefore, the sheath was sewn in place with plan to remove it ~2hrs after discontinuation of Angiomax with manual pressure for hemostasis.  The patient was transported to the CCU in a chest pain free and hemodynamically stable condition.  The patient was stable before, during and following the procedure.  Patient did tolerate procedure well.  There were not complications.  EBL: < 20ml  Medications:  Premedication: 324 mg ASA, 4000 Units Heparin IV bolus, IV Morphine 2mg   Sedation: 2 mg IV Versed, 25 IV mcg Fentanyl  Contrast: 155 ml Omnipaque  Anticoagulation: Angiomax Bolus (0.75 mg/kg), and drip (1.75mg /kg/hr) during PCI .  Prasugrel 60 mg  Impression:  Severe 2 vessel CAD with 100% mid Circumflex as the  culprit lesion and a ~80% long irregular mid RCA lesion  Successful PCI of the mid Circumflex with 2 overlapping Promus Element DES (2.5 mm x 12 mm, 2.5 mm x 8 mm) - post dilated at the overlap / proximal stented region to 2.8 mm.  Well preserved LVEF, estimated at ~60-65%. Mild basal inferolateral hypokinesis is suggested. Plan:  Admit to CCU - would not fast-track as she is extremely anxious and would like consideration of staged RCA PCI prior to discharge.  Will need to discuss pros  & cons of early staged PCI, vs. Potential discharge with plan to return within ~3-4 weeks for staged PCI as OP.  ASA, Prasugrel for minimum of 1 year.  Statin & BB therapy.  Smoking cessation counseling.  Cardiac Rehab Consult as well as Care Management consult for medication needs.  2D Echo to better assess cardiac function from 3D perspective. The case and results was discussed with the patient (and family). Pictures were provided.  The case and results was not discussed with the patient's PCP.  Time Spend Directly with Patient:  90 minutes  HARDING,DAVID W, M.D., M.S.  [facility]  [address]  [place], [place] [address]  [phone]   2-D echocardiogram Study Conclusions  - Left ventricle: The cavity size was normal. Wall thickness was normal. Systolic function was at the lower limits of normal. The estimated ejection fraction was in the range of 50% to 55%. Hypokinesis of the basal-midinferolateral myocardium. Left ventricular diastolic function parameters were normal. - Mitral valve: Mild regurgitation directed centrally.  BMET    Component Value Date/Time   NA 143 10/12/2011 0650   K 4.2 10/12/2011 0650   CL 107 10/12/2011 0650   CO2 25 10/12/2011 0650   GLUCOSE 112* 10/12/2011 0650   BUN 13 10/12/2011 0650   CREATININE 0.64 10/12/2011 0650   CALCIUM 9.6 10/12/2011 0650   GFRNONAA >90 10/12/2011 0650   GFRAA >90 10/12/2011 0650    CBC    Component Value Date/Time   WBC 7.5 10/12/2011 0650   RBC 4.42 10/12/2011 0650   HGB 13.1 10/12/2011 0650   HCT 38.7 10/12/2011 0650   PLT 184 10/12/2011 0650   MCV 87.6 10/12/2011 0650   MCH 29.6 10/12/2011 0650   MCHC 33.9 10/12/2011 0650   RDW 13.3 10/12/2011 0650   LYMPHSABS 2.0 10/10/2011 0200   MONOABS 0.5 10/10/2011 0200   EOSABS 0.2 10/10/2011 0200   BASOSABS 0.0 10/10/2011 0200    Treatments:   Discharge Exam: Blood pressure 113/65, pulse 68, temperature 97.9 F (36.6 C), temperature  source Oral, resp. rate 18, height 5\' 5"  (1.651 m), weight 73.7 kg (162 lb 7.7 oz), SpO2 99.00%.   Disposition: 01-Home or Self Care  Discharge Orders    Future Appointments: Provider: Department: Dept Phone: Center:   01/01/2012 2:30 PM Newt Lukes, MD [facility] [phone] [facility]     Future Orders Please Complete By Expires   Amb Referral to Cardiac Rehabilitation      Comments:   Pt is for second PCI in 2 weeks, will wait to get her in after that is completed.   Diet - low sodium heart healthy      Increase activity slowly      Discharge instructions      Comments:   Rest.     Medication List  As of 10/12/2011 12:01 PM   TAKE these medications         acetaminophen 325  MG tablet   Commonly known as: TYLENOL   Take 2 tablets (650 mg total) by mouth every 4 (four) hours as needed.      albuterol 90 MCG/ACT inhaler   Commonly known as: PROVENTIL,VENTOLIN   Inhale 1-2 puffs into the lungs every 4 (four) hours as needed for wheezing or shortness of breath.      aspirin 81 MG chewable tablet   Chew 1 tablet (81 mg total) by mouth daily.      atorvastatin 40 MG tablet   Commonly known as: LIPITOR   Take 1 tablet (40 mg total) by mouth daily at 6 PM.      benzonatate 100 MG capsule   Commonly known as: TESSALON   Take 1 capsule (100 mg total) by mouth 3 (three) times daily as needed for cough.      CALCIUM 600+D PO   Take 1 tablet by mouth 2 (two) times daily.      clonazePAM 1 MG tablet   Commonly known as: KLONOPIN   TAKE 1 TABLET BY MOUTH THREE TIMES DAILY AS NEEDED FOR ANXIETY      isosorbide mononitrate 30 MG 24 hr tablet   Commonly known as: IMDUR   Take 1 tablet (30 mg total) by mouth daily.      loratadine 10 MG tablet   Commonly known as: CLARITIN   Take 1 tablet (10 mg total) by mouth daily as needed for allergies.      metoprolol succinate 25 MG 24 hr tablet   Commonly known as: TOPROL-XL   Take 1 tablet (25 mg total) by mouth daily.       omeprazole 20 MG capsule   Commonly known as: PRILOSEC   Take 1 capsule (20 mg total) by mouth 2 (two) times daily.      phenol 1.4 % Liqd   Commonly known as: CHLORASEPTIC   Use as directed 1 spray in the mouth or throat as needed.      prasugrel 10 MG Tabs   Commonly known as: EFFIENT   Take 1 tablet (10 mg total) by mouth daily.             SignedDwana Melena 10/12/2011, 12:01 PM

## 2011-10-12 NOTE — Progress Notes (Signed)
CARDIAC REHAB PHASE I   PRE:  Rate/Rhythm: 79 sinus  BP:  Supine:   Sitting: 110/72  Standing:    SaO2: 98 RA  MODE:  Ambulation: 500 ft   POST:  Rate/Rhythem: 60 sinus  BP:  Supine:   Sitting: 110/72  Standing:    SaO2: 96 RA  Pt ambulated 515ft with assist x1.  Pt maintained SaO2 above 90% during walking but continued to c/o SOB.  Pt very anxious about health.  We will follow up as outpatient after 2nd PCI.  Also reviewed all d/c education again and answered questions. Fabio Pierce, MA, ACSM RCEP [phone]  Hazle Nordmann

## 2011-10-15 DIAGNOSIS — F17200 Nicotine dependence, unspecified, uncomplicated: Secondary | ICD-10-CM | POA: Diagnosis not present

## 2011-10-15 DIAGNOSIS — I2510 Atherosclerotic heart disease of native coronary artery without angina pectoris: Secondary | ICD-10-CM | POA: Diagnosis not present

## 2011-10-15 DIAGNOSIS — Z9861 Coronary angioplasty status: Secondary | ICD-10-CM | POA: Diagnosis not present

## 2011-10-19 ENCOUNTER — Other Ambulatory Visit: Payer: Self-pay | Admitting: Internal Medicine

## 2011-10-21 ENCOUNTER — Telehealth: Payer: Self-pay | Admitting: Internal Medicine

## 2011-10-21 MED ORDER — CLONAZEPAM 1 MG PO TABS: 1.0000 mg | ORAL_TABLET | Freq: Three times a day (TID) | ORAL | Status: DC | PRN

## 2011-10-21 NOTE — Telephone Encounter (Signed)
Faxed script back to [facility],,, 10/21/11@11 :36am/LMB

## 2011-10-21 NOTE — Telephone Encounter (Signed)
ok 

## 2011-10-21 NOTE — Telephone Encounter (Signed)
The pt called and is hoping to get a refill of her clonazepam 1mg  sent to the [facility] at [address].   Thanks!

## 2011-10-31 DIAGNOSIS — D689 Coagulation defect, unspecified: Secondary | ICD-10-CM | POA: Diagnosis not present

## 2011-10-31 DIAGNOSIS — Z01818 Encounter for other preprocedural examination: Secondary | ICD-10-CM | POA: Diagnosis not present

## 2011-11-01 ENCOUNTER — Other Ambulatory Visit: Payer: Self-pay | Admitting: Cardiology

## 2011-11-06 ENCOUNTER — Ambulatory Visit (HOSPITAL_COMMUNITY)
Admission: RE | Admit: 2011-11-06 | Discharge: 2011-11-07 | Disposition: A | Discharge status: Home / Self Care | Payer: Medicare Other | Source: Ambulatory Visit | Attending: Cardiology | Admitting: Cardiology

## 2011-11-06 ENCOUNTER — Other Ambulatory Visit: Payer: Self-pay

## 2011-11-06 ENCOUNTER — Encounter (HOSPITAL_COMMUNITY)
Admission: RE | Disposition: A | Discharge status: Home / Self Care | Payer: Self-pay | Source: Ambulatory Visit | Attending: Cardiology

## 2011-11-06 ENCOUNTER — Encounter (HOSPITAL_COMMUNITY): Payer: Self-pay | Admitting: General Practice

## 2011-11-06 DIAGNOSIS — I252 Old myocardial infarction: Secondary | ICD-10-CM | POA: Diagnosis not present

## 2011-11-06 DIAGNOSIS — I2119 ST elevation (STEMI) myocardial infarction involving other coronary artery of inferior wall: Secondary | ICD-10-CM | POA: Diagnosis not present

## 2011-11-06 DIAGNOSIS — R569 Unspecified convulsions: Secondary | ICD-10-CM | POA: Diagnosis present

## 2011-11-06 DIAGNOSIS — J4489 Other specified chronic obstructive pulmonary disease: Secondary | ICD-10-CM | POA: Insufficient documentation

## 2011-11-06 DIAGNOSIS — F17200 Nicotine dependence, unspecified, uncomplicated: Secondary | ICD-10-CM | POA: Diagnosis present

## 2011-11-06 DIAGNOSIS — F32A Depression, unspecified: Secondary | ICD-10-CM | POA: Diagnosis present

## 2011-11-06 DIAGNOSIS — F411 Generalized anxiety disorder: Secondary | ICD-10-CM | POA: Diagnosis not present

## 2011-11-06 DIAGNOSIS — E785 Hyperlipidemia, unspecified: Secondary | ICD-10-CM | POA: Diagnosis present

## 2011-11-06 DIAGNOSIS — I2510 Atherosclerotic heart disease of native coronary artery without angina pectoris: Secondary | ICD-10-CM | POA: Insufficient documentation

## 2011-11-06 DIAGNOSIS — J449 Chronic obstructive pulmonary disease, unspecified: Secondary | ICD-10-CM | POA: Diagnosis not present

## 2011-11-06 DIAGNOSIS — Z955 Presence of coronary angioplasty implant and graft: Secondary | ICD-10-CM

## 2011-11-06 DIAGNOSIS — K219 Gastro-esophageal reflux disease without esophagitis: Secondary | ICD-10-CM | POA: Diagnosis present

## 2011-11-06 DIAGNOSIS — Z9861 Coronary angioplasty status: Secondary | ICD-10-CM | POA: Insufficient documentation

## 2011-11-06 DIAGNOSIS — F329 Major depressive disorder, single episode, unspecified: Secondary | ICD-10-CM | POA: Diagnosis present

## 2011-11-06 DIAGNOSIS — G2581 Restless legs syndrome: Secondary | ICD-10-CM | POA: Diagnosis present

## 2011-11-06 DIAGNOSIS — J441 Chronic obstructive pulmonary disease with (acute) exacerbation: Secondary | ICD-10-CM | POA: Diagnosis present

## 2011-11-06 DIAGNOSIS — I25119 Atherosclerotic heart disease of native coronary artery with unspecified angina pectoris: Secondary | ICD-10-CM | POA: Diagnosis present

## 2011-11-06 DIAGNOSIS — F419 Anxiety disorder, unspecified: Secondary | ICD-10-CM | POA: Diagnosis present

## 2011-11-06 DIAGNOSIS — M129 Arthropathy, unspecified: Secondary | ICD-10-CM | POA: Diagnosis not present

## 2011-11-06 HISTORY — PX: PERCUTANEOUS CORONARY STENT INTERVENTION (PCI-S): SHX5485

## 2011-11-06 HISTORY — DX: Respiratory tuberculosis unspecified: A15.9

## 2011-11-06 HISTORY — PX: CORONARY ANGIOPLASTY WITH STENT PLACEMENT: SHX49

## 2011-11-06 HISTORY — DX: Angina pectoris, unspecified: I20.9

## 2011-11-06 LAB — POCT ACTIVATED CLOTTING TIME: Activated Clotting Time: 347 seconds

## 2011-11-06 SURGERY — PERCUTANEOUS CORONARY STENT INTERVENTION (PCI-S)
Anesthesia: LOCAL

## 2011-11-06 MED ORDER — ATORVASTATIN CALCIUM 40 MG PO TABS
40.0000 mg | ORAL_TABLET | Freq: Every day | ORAL | Status: DC
  Administered 2011-11-06: 40 mg via ORAL
  Filled 2011-11-06 (×3): qty 1

## 2011-11-06 MED ORDER — NITROGLYCERIN 0.4 MG SL SUBL
0.4000 mg | SUBLINGUAL_TABLET | SUBLINGUAL | Status: DC | PRN
  Filled 2011-11-06: qty 25

## 2011-11-06 MED ORDER — SODIUM CHLORIDE 0.9 % IJ SOLN: 3.0000 mL | INTRAMUSCULAR | Status: DC | PRN

## 2011-11-06 MED ORDER — CALCIUM CARBONATE-VITAMIN D 500-200 MG-UNIT PO TABS
1.0000 | ORAL_TABLET | Freq: Every day | ORAL | Status: DC
  Administered 2011-11-06 – 2011-11-07 (×2): 1 via ORAL
  Filled 2011-11-06 (×3): qty 1

## 2011-11-06 MED ORDER — ALUM & MAG HYDROXIDE-SIMETH 200-200-20 MG/5ML PO SUSP
30.0000 mL | ORAL | Status: DC | PRN
  Administered 2011-11-06 – 2011-11-07 (×2): 30 mL via ORAL
  Filled 2011-11-06 (×2): qty 30

## 2011-11-06 MED ORDER — SODIUM CHLORIDE 0.9 % IJ SOLN: 3.0000 mL | Freq: Two times a day (BID) | INTRAMUSCULAR | Status: DC

## 2011-11-06 MED ORDER — MIDAZOLAM HCL 2 MG/2ML IJ SOLN
INTRAMUSCULAR | Status: AC
  Filled 2011-11-06: qty 2

## 2011-11-06 MED ORDER — BIVALIRUDIN 250 MG IV SOLR
INTRAVENOUS | Status: AC
  Filled 2011-11-06: qty 250

## 2011-11-06 MED ORDER — CLONAZEPAM 0.5 MG PO TABS
1.0000 mg | ORAL_TABLET | Freq: Three times a day (TID) | ORAL | Status: DC | PRN
  Administered 2011-11-06 – 2011-11-07 (×2): 1 mg via ORAL
  Filled 2011-11-06 (×2): qty 2

## 2011-11-06 MED ORDER — PANTOPRAZOLE SODIUM 40 MG PO TBEC
40.0000 mg | DELAYED_RELEASE_TABLET | Freq: Every day | ORAL | Status: DC
  Administered 2011-11-07: 40 mg via ORAL
  Filled 2011-11-06: qty 1

## 2011-11-06 MED ORDER — ZOLPIDEM TARTRATE 5 MG PO TABS: 10.0000 mg | ORAL_TABLET | Freq: Every evening | ORAL | Status: DC | PRN

## 2011-11-06 MED ORDER — SODIUM CHLORIDE 0.9 % IV SOLN
INTRAVENOUS | Status: DC
  Administered 2011-11-06: 1000 mL via INTRAVENOUS

## 2011-11-06 MED ORDER — METOPROLOL SUCCINATE ER 25 MG PO TB24
25.0000 mg | ORAL_TABLET | Freq: Every day | ORAL | Status: DC
  Filled 2011-11-06 (×3): qty 1

## 2011-11-06 MED ORDER — TRAMADOL HCL 50 MG PO TABS
50.0000 mg | ORAL_TABLET | Freq: Four times a day (QID) | ORAL | Status: DC | PRN
  Filled 2011-11-06: qty 1

## 2011-11-06 MED ORDER — ISOSORBIDE MONONITRATE ER 30 MG PO TB24
30.0000 mg | ORAL_TABLET | Freq: Every day | ORAL | Status: DC
  Administered 2011-11-07: 30 mg via ORAL
  Filled 2011-11-06 (×3): qty 1

## 2011-11-06 MED ORDER — ONDANSETRON HCL 4 MG/2ML IJ SOLN: 4.0000 mg | Freq: Four times a day (QID) | INTRAMUSCULAR | Status: DC | PRN

## 2011-11-06 MED ORDER — LORATADINE 10 MG PO TABS
10.0000 mg | ORAL_TABLET | Freq: Every day | ORAL | Status: DC | PRN
  Filled 2011-11-06: qty 1

## 2011-11-06 MED ORDER — SODIUM CHLORIDE 0.9 % IJ SOLN
3.0000 mL | Freq: Two times a day (BID) | INTRAMUSCULAR | Status: DC
  Administered 2011-11-06: 3 mL via INTRAVENOUS

## 2011-11-06 MED ORDER — NITROGLYCERIN 0.2 MG/ML ON CALL CATH LAB
INTRAVENOUS | Status: AC
  Filled 2011-11-06: qty 1

## 2011-11-06 MED ORDER — SODIUM CHLORIDE 0.9 % IV SOLN: 250.0000 mL | INTRAVENOUS | Status: DC

## 2011-11-06 MED ORDER — MORPHINE SULFATE 2 MG/ML IJ SOLN
2.0000 mg | INTRAMUSCULAR | Status: DC | PRN
  Administered 2011-11-06 – 2011-11-07 (×2): 2 mg via INTRAVENOUS
  Filled 2011-11-06 (×2): qty 1

## 2011-11-06 MED ORDER — ALBUTEROL 90 MCG/ACT IN AERS: 1.0000 | INHALATION_SPRAY | RESPIRATORY_TRACT | Status: DC | PRN

## 2011-11-06 MED ORDER — ACETAMINOPHEN 325 MG PO TABS
650.0000 mg | ORAL_TABLET | ORAL | Status: DC | PRN
  Administered 2011-11-07: 650 mg via ORAL
  Filled 2011-11-06: qty 2

## 2011-11-06 MED ORDER — HEPARIN (PORCINE) IN NACL 2-0.9 UNIT/ML-% IJ SOLN
INTRAMUSCULAR | Status: AC
  Filled 2011-11-06: qty 1000

## 2011-11-06 MED ORDER — FENTANYL CITRATE 0.05 MG/ML IJ SOLN
INTRAMUSCULAR | Status: AC
  Filled 2011-11-06: qty 2

## 2011-11-06 MED ORDER — ALBUTEROL SULFATE HFA 108 (90 BASE) MCG/ACT IN AERS: 1.0000 | INHALATION_SPRAY | RESPIRATORY_TRACT | Status: DC | PRN

## 2011-11-06 MED ORDER — DIAZEPAM 5 MG PO TABS
5.0000 mg | ORAL_TABLET | ORAL | Status: AC
  Administered 2011-11-06: 5 mg via ORAL
  Filled 2011-11-06: qty 1

## 2011-11-06 MED ORDER — ASPIRIN 81 MG PO CHEW
81.0000 mg | CHEWABLE_TABLET | Freq: Every day | ORAL | Status: DC
  Filled 2011-11-06: qty 1

## 2011-11-06 MED ORDER — CALCIUM CARBONATE-VITAMIN D 600-200 MG-UNIT PO TABS: ORAL_TABLET | Freq: Every day | ORAL | Status: DC

## 2011-11-06 MED ORDER — BENZONATATE 100 MG PO CAPS
100.0000 mg | ORAL_CAPSULE | Freq: Three times a day (TID) | ORAL | Status: DC | PRN
  Filled 2011-11-06: qty 1

## 2011-11-06 MED ORDER — PRASUGREL HCL 10 MG PO TABS
10.0000 mg | ORAL_TABLET | Freq: Every day | ORAL | Status: DC
  Administered 2011-11-07: 10 mg via ORAL
  Filled 2011-11-06 (×2): qty 1

## 2011-11-06 MED ORDER — SODIUM CHLORIDE 0.9 % IV SOLN
1.0000 mL/kg/h | INTRAVENOUS | Status: AC
  Administered 2011-11-06: 1 mL/kg/h via INTRAVENOUS

## 2011-11-06 MED ORDER — LIDOCAINE HCL (PF) 1 % IJ SOLN
INTRAMUSCULAR | Status: AC
  Filled 2011-11-06: qty 30

## 2011-11-06 NOTE — Progress Notes (Signed)
TR BAND REMOVAL  LOCATION:    right radial  DEFLATED PER PROTOCOL:    yes  TIME BAND OFF / DRESSING APPLIED:    1430   SITE UPON ARRIVAL:    Level 0  SITE AFTER BAND REMOVAL:    Level 0  REVERSE ALLEN'S TEST:     positive  CIRCULATION SENSATION AND MOVEMENT:    Within Normal Limits   yes  COMMENTS:  Tolerated procedure well 

## 2011-11-06 NOTE — Op Note (Signed)
[facility] PERCUTANEOUS CORONARY INTERVENTION REPORT  NAME:  Michele [name]   MRN: [id number] DOB:  [birth year]   ADMIT DATE: 11/06/2011  INTERVENTIONAL CARDIOLOGIST: Marykay Lex, M.D., MS PRIMARY CARE PROVIDER: Rene Paci, MD, MD PRIMARY CARDIOLOGIST:   PATIENT:  Michele [name] is a 56 y.o. female who is ~1 moth s/p Inferolateral STEMI with a 100% LCx-OM stenosis treated with PCI using a 2 overlapping Promus DES stents.  Also noted during diagnostic angiogaraphy was a ~80-90% mid RCA lesion.  She has been noting exertional chest pain, and is referred for planned staged PCI of the RCA.   PRE-OPERATIVE DIAGNOSIS:    Existing severe mid RCA stensosis  Chest pain  PROCEDURES PERFORMED:    Coronary Angiography via 6Fr Right Radial Artery Access  Percutaneous Coronary Intervention of the mid RCA using a Promus Element DES 2.5 mm x 24 mm- post-dilated to ~2.75-2.8 mm  PROCEDURE: Consent: The procedure with Risks/Benefits/Alternatives and Indications was reviewed with the patient and family.  All questions were answered.    Risks / Complications include, but not limited to: Death, MI, CVA/TIA, VF/VT (with defibrillation), Bradycardia (need for temporary pacer placement), contrast induced nephropathy, bleeding / bruising / hematoma / pseudoaneurysm, vascular or coronary injury (with possible emergent CT or Vascular Surgery), adverse medication reactions, infection.    The patient and family voiced understanding and agree to proceed.    Consent for signed by MD and patient with RN witness -- placed on chart.  Procedure: The patient was brought to the 2nd Floor [facility] in the fasting state and prepped and draped in the usual sterile fashion for Right groin or radial artery access. Sterile technique was used including antiseptics, cap, gloves, gown, hand hygiene, [name] and sheet.  Skin prep: Chlorhexidine;  Time Out: Verified  patient identification, verified procedure, site/side was marked, verified correct patient position, special equipment/implants available, medications/allergies/relevent history reviewed, required imaging and test results available.  Performed  The right wrist was anesthetized with 1% subcutaneous Lidocaine.  The right radial artery was accessed using the Seldinger Technique with placement of a 6 Fr Glide Sheath. The sheath was aspirated and flushed.  Then a total of 7.5 ml of standard Radial Artery Cocktail (see medications) was infused.  Radial Cocktail: 2.5 mg Nicardipine, 400 mcg NTG, 2 ml 2% Lidocaine  A 5 Fr TIG 4.0 Catheter was advanced of over a Versicore wire into the ascending Aorta.  The catheter was used to engage the Left coronary artery, and .  Multiple cineangiographic views of the Left coronary artery system(s) were performed.  The catheter was advanced across the Aortic Valve and LV hemodynamics were measured.  Tthe catheter was pulled back across the Aortic Valve for measurement of "pull-back" gradient.  A 6 Fr JR4 Guide Catheter was advanced of over a Lexicographer J wire into the ascending Aorta, and was used to engage the Right coronary artery.  Multiple cineangiographic views of the Rigt coronary artery system(s) were performed.  PCI was performed.  PCI:  Guide: 6 Fr   JR4 Guidewire: Prowater  Predilation Balloon: Emerge MR 2.0  mm x 20 mm; 10 Atm x 40 Sec,  Stent: Promus Element DES 2.5 mm x 24 mm; 12 Atm x 33 Sec, 16 Atm x 50 Sec; final distal diameter ~2.28mm  Post-Dilation Balloon: Solomon Quantum Apex 2.75 mm x 20 mm; 15 Atm x 45 Sec; final diameter ~2.8 mm Post deployment angiography revealed excellent stent  expansion with no evidence of dissection or perforation.  The existing downstream ~40% lesion remains stable.  The sheath was removed in the Cath Lab with a TR band placed at 14 ml Air at 1015 hr (time).  Reverse Allen's test revealed non-occlusive  hemostasis.  Hemodynamics:  Central Aortic / Mean Pressures: 94/52 mmHg; 71 mmHg  Left Ventricular Pressures / EDP: 97/3 mmHg; 6 mmHg  Coronary Anatomy:  Left Main: Large caliber, widely patent; bifurcates into LAD & LCx  LAD: Large caliber vessel giving rise to several small septal perforators as well as a mid vessel major diagonal branch and then wrapping around the apex; minimal luminal irregularities.   1st diagonal (D1): Moderate caliber vessel bifurcates along the lateral wall; minimal luminal irregularities  Left Circumflex: Large-caliber vessel to rise to a proximal moderate to large caliber OM1 that bifurcates reaching toward the anterolateral apex followed by a widely patentoverlaping stented segment; there is then a second smaller OM 2 and  then bifurcated OM 3 in the AV groove circumflex/posterior lateral system of 2 small posterior lateral branches.  1st obtuse marginal: Moderate to large caliber vessel bifurcates terminally; Minimal luminal irregularities.  2nd obtuse marginal: Small to moderate caliber vessel; minimal luminal irregularities   3rd obtuse marginal: Moderate to large caliber vessel with widely patent stent in proximal portion.    RCA: Large large-caliber vessel with diffuse 20-30% irregularities proximally to mid portion of which is tapers into a long irregular / eccentric tubular 85% stenosis just prior to the genu, this is followed by two 40-50% lesions with the most distal just prior to bifurcation into a small moderate caliber PDA and very small right posterior lateral system with minimal luminal irregularities.  TR Band:  1015 Hours, 14 mL air  ANESTHESIA:   Local Lidocaine 4 ml SEDATION:  5 mg IV Versed, 75 mcg IV fentanyl ; Premedication: 5mg  PO Valium MEDICATIONS: Radial Cocktail: 2.5 mg Nicardipine, 400 mcg NTG, 2 ml 2% Lidocaine (7.5 ml of 10ml)  Angiomax Bolus & gtt during PCI.  EBL:   < 10 ml  PATIENT DISPOSITION:    Hemodynamically  stable, chest pain free to PACU  POST-OPERATIVE DIAGNOSIS:    Successful PCI of mid RCA with DES stent as described.  Widely patent previously placed LCx-OM3 stent.  PLAN OF CARE:  Standard post-radial PCI care  Anticipate d/c in AM - f/u with me as scheduled.  Significant Anxiety -- needs to see PMD to start on SSRI/SNRI.; will use PRN anxiolytics.   Marykay Lex, M.D., M.S. [facility] [address] [place], [place]  [address]  [phone]  11/06/2011 10:30 AM

## 2011-11-06 NOTE — Progress Notes (Signed)
Pt called RN for her "nerve medicine".  Pt moderately anxious, given Klonopin po.  Pt states MD told her to take 2mg  Klonopin at home when she needs to.  Advised her only 1 mg ordered here but can take TID.   Pt then said she was having "gas", asking many questions about her heart, anxious about entire situation of receiving stents and previous heart attack. Ate 100% of meal.  BP 123/61, SR 68.  Pt states pain comes and goes quickly, points to epigastric region.  Maalox given po.  Still quite anxious, EKG done, compared to previous with no change noted.  BP 126/63, SR 63, remains anxious. Now holding rubbing anterior chest suggesting pain has moved superiorly.  Walked to bathroom without difficulty.  No SOB, lungs slightly harsh to [place].   2 phones ringing almost constantly.  Reassurance provided, encouraged pt to turn phone off.  Lights dimmed.  Offered NTG sl, pt hesitant to take NTG b/c "that causes a headache".  Explained to pt that next logical step to take would be NTG in case it is cardiac related.  Pt states pain "gone now."  Will continue to monitor closely.

## 2011-11-06 NOTE — Progress Notes (Signed)
Pt was asleep, awakened by Dr [name] rounding.  MD updated with events, pt denied pain until MD walked out of room, then said severe pain returned, MD back in to discuss with pt.  EKG shown to MD.  Order received for morphine.  Arrived to patients room with morphine, pt asked "do I have to take it now?"  RN asked pt did she not say she had pain, she advised, "not anymore."  States her husband is coming to visit and she'd like to take morphine after he gets here.
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
## 2011-11-06 NOTE — Progress Notes (Signed)
Pt smokes 1 ppd down from 2 1/2 ppd a month ago. She is in action stage and wants to quit. Recommended 21 mg patch to start with. Discussed patch use instructions and how to taper. Pt voices understanding. Referred to 1-800 quit now for f/u and support. Discussed oral fixation substitutes, second hand smoke and in home smoking policy. Reviewed and gave pt Written education/contact information.

## 2011-11-07 ENCOUNTER — Ambulatory Visit (HOSPITAL_COMMUNITY): Payer: Medicare Other

## 2011-11-07 DIAGNOSIS — I2510 Atherosclerotic heart disease of native coronary artery without angina pectoris: Secondary | ICD-10-CM | POA: Diagnosis not present

## 2011-11-07 DIAGNOSIS — R079 Chest pain, unspecified: Secondary | ICD-10-CM | POA: Diagnosis not present

## 2011-11-07 DIAGNOSIS — J449 Chronic obstructive pulmonary disease, unspecified: Secondary | ICD-10-CM | POA: Diagnosis not present

## 2011-11-07 DIAGNOSIS — Z9861 Coronary angioplasty status: Secondary | ICD-10-CM | POA: Diagnosis not present

## 2011-11-07 DIAGNOSIS — F17200 Nicotine dependence, unspecified, uncomplicated: Secondary | ICD-10-CM | POA: Diagnosis not present

## 2011-11-07 DIAGNOSIS — F411 Generalized anxiety disorder: Secondary | ICD-10-CM | POA: Diagnosis not present

## 2011-11-07 DIAGNOSIS — I252 Old myocardial infarction: Secondary | ICD-10-CM | POA: Diagnosis not present

## 2011-11-07 LAB — BASIC METABOLIC PANEL
BUN: 14 mg/dL (ref 6–23)
CO2: 25 mEq/L (ref 19–32)
Calcium: 9.3 mg/dL (ref 8.4–10.5)
Chloride: 105 mEq/L (ref 96–112)
Creatinine, Ser: 0.58 mg/dL (ref 0.50–1.10)
GFR calc Af Amer: >90 mL/min (ref >90)
GFR calc non Af Amer: >90 mL/min (ref >90)
Glucose, Bld: 164 mg/dL — ABNORMAL HIGH (ref 70–99)
Potassium: 3.5 mEq/L (ref 3.5–5.1)
Sodium: 140 mEq/L (ref 135–145)

## 2011-11-07 LAB — CBC
HCT: 37.6 % (ref 36.0–46.0)
Hemoglobin: 12.7 g/dL (ref 12.0–15.0)
MCH: 29.5 pg (ref 26.0–34.0)
MCHC: 33.8 g/dL (ref 30.0–36.0)
MCV: 87.2 fL (ref 78.0–100.0)
Platelets: 171 10*3/uL (ref 150–400)
RBC: 4.31 MIL/uL (ref 3.87–5.11)
RDW: 13.2 % (ref 11.5–15.5)
WBC: 6.2 10*3/uL (ref 4.0–10.5)

## 2011-11-07 MED ORDER — PRASUGREL HCL 10 MG PO TABS: 10.0000 mg | ORAL_TABLET | Freq: Every day | ORAL | Status: DC

## 2011-11-07 MED ORDER — POTASSIUM CHLORIDE CRYS ER 20 MEQ PO TBCR
20.0000 meq | EXTENDED_RELEASE_TABLET | Freq: Once | ORAL | Status: AC
  Administered 2011-11-07: 20 meq via ORAL
  Filled 2011-11-07: qty 1

## 2011-11-07 MED ORDER — ASPIRIN EC 81 MG PO TBEC
81.0000 mg | DELAYED_RELEASE_TABLET | Freq: Every day | ORAL | Status: DC
  Filled 2011-11-07: qty 1

## 2011-11-07 MED ORDER — NICOTINE 14 MG/24HR TD PT24
14.0000 mg | MEDICATED_PATCH | Freq: Every day | TRANSDERMAL | Status: DC
  Filled 2011-11-07 (×2): qty 1

## 2011-11-07 MED ORDER — NICOTINE 14 MG/24HR TD PT24: MEDICATED_PATCH | TRANSDERMAL | Status: DC

## 2011-11-07 MED FILL — Nicardipine HCl IV Soln 2.5 MG/ML: INTRAVENOUS | Qty: 1 | Status: AC

## 2011-11-07 NOTE — Discharge Summary (Signed)
Patient ID: Michele [name],  MRN: [id number], DOB/AGE: [birth year] 56 y.o.  Admit date: 11/06/2011 Discharge date: 11/07/2011  Primary Care Provider:  Primary Cardiologist: Dr [name]  Discharge Diagnoses Principal Problem:  *CAD - Post MI-PCI CFX, staged DES to RCA  11/06/11 Active Problems:  ANXIETY  CHEST PAIN, ATYPICAL  ST elevation myocardial infarction (STEMI) of inferior wall -- PCI of mid LCx with 2 overlapping Promus Element DES 2.5 x 12; 2.5 x 8 (post-dilated prox to 2.8 mm)  DYSLIPIDEMIA  NICOTINE ADDICTION - Tobacco Abuse, long standing  DEPRESSION  RESTLESS LEG SYNDROME  COPD  GERD  SEIZURE DISORDER    Procedures: Elective RCA DES 11/06/11   Hospital Course: Michele [name] is a 56 y.o. female who is ~1 moth s/p Inferolateral STEMI with a 100% LCx-OM stenosis treated with PCI using a 2 overlapping Promus DES stents. Also noted during diagnostic angiogaraphy was a ~80-90% mid RCA lesion. She has been noting exertional chest pain, and is referred for planned staged PCI of the RCA. The pt underwent elective RCA DES placement 11/06/11 without complications. She did complain of localized lt lateral chest pain post PCI. We did check a CXR prior to discharge that was WNL. She was reassured that if she had ISR it would be similar to her MI symptoms in May. She will be discharged late on the 6th if she is able to be up and ambulating without problems. She is very anxious.   Discharge Vitals:  Blood pressure 111/55, pulse 63, temperature 97.7 F (36.5 C), temperature source Oral, resp. rate 16, height 5' 5.5" (1.664 m), weight 71.6 kg (157 lb 13.6 oz), SpO2 97.00%.    Labs: Results for orders placed during the hospital encounter of 11/06/11 (from the past 48 hour(s))  POCT ACTIVATED CLOTTING TIME     Status: Normal   Collection Time   11/06/11  9:42 AM      Component Value Range Comment   Activated Clotting Time 347     CBC     Status: Normal   Collection Time   11/07/11  4:05 AM      Component Value Range Comment   WBC 6.2  4.0 - 10.5 (K/uL)    RBC 4.31  3.87 - 5.11 (MIL/uL)    Hemoglobin 12.7  12.0 - 15.0 (g/dL)    HCT 40.9  81.1 - 91.4 (%)    MCV 87.2  78.0 - 100.0 (fL)    MCH 29.5  26.0 - 34.0 (pg)    MCHC 33.8  30.0 - 36.0 (g/dL)    RDW 78.2  95.6 - 21.3 (%)    Platelets 171  150 - 400 (K/uL)   BASIC METABOLIC PANEL     Status: Abnormal   Collection Time   11/07/11  4:05 AM      Component Value Range Comment   Sodium 140  135 - 145 (mEq/L)    Potassium 3.5  3.5 - 5.1 (mEq/L)    Chloride 105  96 - 112 (mEq/L)    CO2 25  19 - 32 (mEq/L)    Glucose, Bld 164 (*) 70 - 99 (mg/dL)    BUN 14  6 - 23 (mg/dL)    Creatinine, Ser 0.86  0.50 - 1.10 (mg/dL)    Calcium 9.3  8.4 - 10.5 (mg/dL)    GFR calc non Af Amer >90  >90 (mL/min)    GFR calc Af Amer >90  >90 (mL/min)     Disposition:  Follow-up  Information    Follow up with HARDING,DAVID W, MD. (office will call)    Contact information:   [facility] [address] [place] [address] [phone]          Discharge Medications:  Medication List  As of 11/07/2011 11:48 AM   STOP taking these medications         phenol 1.4 % Liqd         TAKE these medications         acetaminophen 325 MG tablet   Commonly known as: TYLENOL   Take 2 tablets (650 mg total) by mouth every 4 (four) hours as needed.      albuterol 90 MCG/ACT inhaler   Commonly known as: PROVENTIL,VENTOLIN   Inhale 1-2 puffs into the lungs every 4 (four) hours as needed for wheezing or shortness of breath.      aspirin 81 MG chewable tablet   Chew 1 tablet (81 mg total) by mouth daily.      atorvastatin 40 MG tablet   Commonly known as: LIPITOR   Take 1 tablet (40 mg total) by mouth daily at 6 PM.      benzonatate 100 MG capsule   Commonly known as: TESSALON   Take 1 capsule (100 mg total) by mouth 3 (three) times daily as needed for cough.      CALCIUM 600+D PO   Take 1 tablet  by mouth 2 (two) times daily.      clonazePAM 1 MG tablet   Commonly known as: KLONOPIN   Take 1 tablet (1 mg total) by mouth 3 (three) times daily as needed for anxiety.      isosorbide mononitrate 30 MG 24 hr tablet   Commonly known as: IMDUR   Take 1 tablet (30 mg total) by mouth daily.      loratadine 10 MG tablet   Commonly known as: CLARITIN   Take 1 tablet (10 mg total) by mouth daily as needed for allergies.      metoprolol succinate 25 MG 24 hr tablet   Commonly known as: TOPROL-XL   Take 1 tablet (25 mg total) by mouth daily.      nicotine 14 mg/24hr patch   Commonly known as: NICODERM CQ - dosed in mg/24 hours   As directed for smoking cessation      nitroGLYCERIN 0.4 MG SL tablet   Commonly known as: NITROSTAT   Place 1 tablet (0.4 mg total) under the tongue every 5 (five) minutes as needed for chest pain.      omeprazole 20 MG capsule   Commonly known as: PRILOSEC   Take 1 capsule (20 mg total) by mouth 2 (two) times daily.      prasugrel 10 MG Tabs   Commonly known as: EFFIENT   Take 1 tablet (10 mg total) by mouth daily.            Outstanding Labs/Studies  Duration of Discharge Encounter: Greater than 30 minutes including physician time.  Jolene Provost PA-C 11/07/2011 11:48 AM

## 2011-11-07 NOTE — Progress Notes (Signed)
Pt had left message overnight with some questions. Went to revisit pt . Answered pt's questions about the Nicotrol Inhaler again as she didn't remember much of our conversation from yesterday's visit. Also answered her questions with regards to the patch and e-cigarettes.

## 2011-11-07 NOTE — Progress Notes (Signed)
CARDIAC REHAB PHASE I   PRE:  Rate/Rhythm: 65 SR  BP:  Supine: 125/65  Sitting:   Standing:    SaO2: 99 RA  MODE:  Ambulation: 680 ft   POST:  Rate/Rhythem: 73 SR  BP:  Supine:   Sitting: 121/59  Standing:    SaO2: 99 RA 0955-1055 Tolerated ambulation well without c/o of cp. She does c/o of SOB prior to , during and after walk. RA sat 99% before, during and after walk. Reviewed discharge education with pt and boyfriend. She agrees to [employer]. [facility] in [place],will send referral. Pt admits  to being anxious and wonders if that is her source of SOB. She ask for something for anxiety, reported to RN  Kizzie Bane, Nila Nephew

## 2011-11-07 NOTE — Progress Notes (Signed)
Pt has been free of complaints until about an hour ago.  Pt walked to RN station and back to room w/o difficulty, denied cp.  Then ate chips and drank sprite, now c/o sudden sharp pain, pointing to epigastric area and to the left under her rib cage.  Requesting morphine.  BP 108/57, SR 70's.  Medicated w/ iv morphine slow ivp through NS line, quickly c/o dizzy, assisted w/ lying back in bed.  C/O cp much worse lying down.  HOB raised 45 degrees and given po maalox. Advised pt not to get OOB w/o assist of staff. Voices understanding. Husband at bedside.  Continue to monitor closely.

## 2011-11-07 NOTE — Progress Notes (Signed)
Pt called RN back 10 min later requesting morphine b/c husband had arrived.  Given as requested.  Pt drifted off to sleep w/in 30 minutes.

## 2011-11-07 NOTE — Progress Notes (Signed)
Subjective:  Chest pain last night "Worse than my heart attack"  Objective:  Vital Signs in the last 24 hours: Temp:  [97.5 F (36.4 C)-97.8 F (36.6 C)] 97.6 F (36.4 C) (06/06 0327) Pulse Rate:  [61-75] 74  (06/06 0327) Resp:  [16-18] 18  (06/06 0327) BP: (91-126)/(47-63) 108/57 mmHg (06/06 0327) SpO2:  [92 %-97 %] 97 % (06/06 0327) Weight:  [71.6 kg (157 lb 13.6 oz)] 71.6 kg (157 lb 13.6 oz) (06/06 0034)  Intake/Output from previous day:  Intake/Output Summary (Last 24 hours) at 11/07/11 0759 Last data filed at 11/07/11 0330  Gross per 24 hour  Intake 1324.23 ml  Output   1100 ml  Net 224.23 ml    Physical Exam: General appearance: alert, cooperative and no distress Lungs: clear to auscultation bilaterally Heart: regular rate and rhythm   Rate: 72  Rhythm: normal sinus rhythm  Lab Results:  Basename 11/07/11 0405  WBC 6.2  HGB 12.7  PLT 171    Basename 11/07/11 0405  NA 140  K 3.5  CL 105  CO2 25  GLUCOSE 164*  BUN 14  CREATININE 0.58   No results found for this basename: TROPONINI:2,CK,MB:2 in the last 72 hours Hepatic Function Panel No results found for this basename: PROT,ALBUMIN,AST,ALT,ALKPHOS,BILITOT,BILIDIR,IBILI in the last 72 hours No results found for this basename: CHOL in the last 72 hours No results found for this basename: INR in the last 72 hours  Imaging: Imaging results have been reviewed  Cardiac Studies:  Assessment/Plan:   Principal Problem:  *CAD - Post MI-PCI CFX, staged DES to RCA  11/06/11 Active Problems:  ANXIETY  CHEST PAIN, ATYPICAL  ST elevation myocardial infarction (STEMI) of inferior wall -- PCI of mid LCx with 2 overlapping Promus Element DES 2.5 x 12; 2.5 x 8 (post-dilated prox to 2.8 mm)  DYSLIPIDEMIA  NICOTINE ADDICTION - Tobacco Abuse, long standing  DEPRESSION  RESTLESS LEG SYNDROME  COPD  GERD  SEIZURE DISORDER   Plan- ambulate this am, home later if stable.   Corine Shelter PA-C 11/07/2011, 7:59  AM    Agree with note written by Corine Shelter [facility]  S/P planned elective staged RCA PCI/Stent with DES done radially by Dr. [facility]. Looks great. C/O CO and SOB last PM. Will  ambulate this am. Nicoderm patch for smoking cessation. Home later today.  Michele [name] J 11/07/2011 8:00 AM

## 2011-11-07 NOTE — Discharge Instructions (Signed)
Angioplasty Angioplasty is a procedure to widen a narrow blood vessel. The procedure is usually done on the blood vessels of the heart (coronary arteries) but may help vessels to other parts of the body such as the legs. When a vessel in the heart becomes partially blocked there is decreased blood flow to that area. This may lead to chest pain or a heart attack (myocardial infarction).  Angioplasty may be done after a procedure that found a problem or as an emergency to treat a heart attack by opening the blocked arteries. The arteries are usually blocked by cholesterol buildup (plaque) in the lining or walls. LET YOUR CAREGIVER KNOW ABOUT:  Allergies.   Medicines taken, including herbs, eyedrops, over-the-counter medicines, and creams.   Use of steroids (by mouth or creams).   Previous problems with anesthetics or numbing medicines.   Possibility of pregnancy, if this applies.   History of blood clots (thrombophlebitis).   History of bleeding or blood problems.   Previous surgery.   Other health problems.  RISKS AND COMPLICATIONS  Damage to the artery.   A blockage may return.   Bleeding at the insertion site.   Blood clot to another part of the body.  BEFORE THE PROCEDURE  Let your caregiver know if you have had an allergy to dyes used in X-ray, or if you have ever had kidney problems or failure.   Do not eat or drink starting from midnight up to the time of the procedure, or as directed.   You may drink enough water to take your medicines the morning of the procedure if you were instructed to do so.   You should be at the hospital or outpatient facility where the procedure is to be done 60 minutes prior to the procedure or as directed.  PROCEDURE  You may be given a medicine to help you relax before and during the procedure through an intravenous (IV) access in your hand or arm.   Medicine that numbs the area (local anesthetic) may be used before inserting the long,  thin tube (catheter).   You will be prepared for the procedure by washing and shaving the area where the catheter will be inserted. This is usually done in the groin.   A catheter will be inserted into an artery using a guide wire. This is guided under a type of X-ray (fluoroscopy) to the opening of the blocked artery.   Dye is then injected and X-rays are taken.   Once positioned at the narrowed portion of the blood vessel, the balloon is inflated to make the artery wider. Expanding the balloon crushes the plaque into the wall of the vessel and improves the blood flow.   Sometimes the artery may be made wider using a laser or other tools to remove plaque.   When the blood flow is better, the balloon is deflated and the catheter is removed.  AFTER THE PROCEDURE  You will stay in bed for several hours.   The access site will be watched and you will be checked frequently.   Blood tests, X-rays, and an electrocardiogram (EKG) may be done.   You may stay in the hospital overnight for observation.  Document Released: 05/17/2000 Document Revised: 05/09/2011 Document Reviewed: 09/10/2007 [facility] Patient Information 2012 [place], [place].  Radial Site Care Refer to this sheet in the next few weeks. These instructions provide you with information on caring for yourself after your procedure. Your caregiver may also give you more specific instructions. Your treatment  has been planned according to current medical practices, but problems sometimes occur. Call your caregiver if you have any problems or questions after your procedure. HOME CARE INSTRUCTIONS  You may shower the day after the procedure.Remove the bandage (dressing) and gently wash the site with plain soap and water.Gently pat the site dry.   Do not apply powder or lotion to the site.   Do not submerge the affected site in water for 3 to 5 days.   Inspect the site at least twice daily.   Do not flex or bend the affected arm for  24 hours.   No lifting over 5 pounds (2.3 kg) for 5 days after your procedure.   Do not drive home if you are discharged the same day of the procedure. Have someone else drive you.   You may drive 24 hours after the procedure unless otherwise instructed by your caregiver.   Do not operate machinery or power tools for 24 hours.   A responsible adult should be with you for the first 24 hours after you arrive home.  What to expect:  Any bruising will usually fade within 1 to 2 weeks.   Blood that collects in the tissue (hematoma) may be painful to the touch. It should usually decrease in size and tenderness within 1 to 2 weeks.  SEEK IMMEDIATE MEDICAL CARE IF:  You have unusual pain at the radial site.   You have redness, warmth, swelling, or pain at the radial site.   You have drainage (other than a small amount of blood on the dressing).   You have chills.   You have a fever or persistent symptoms for more than 72 hours.   You have a fever and your symptoms suddenly get worse.   Your arm becomes pale, cool, tingly, or numb.   You have heavy bleeding from the site. Hold pressure on the site.  Document Released: 06/22/2010 Document Revised: 05/09/2011 Document Reviewed: 06/22/2010 [facility] Patient Information 2012 [place], [place].

## 2011-11-08 MED FILL — Dextrose Inj 5%: INTRAVENOUS | Qty: 50 | Status: AC

## 2011-11-16 IMAGING — CR DG CHEST 2V
2 series · 2 of 2 positions shown · non-contrast
Comparison: Two-view chest x-ray 04/13/2009, 08/05/2007, and
10/17/2005.

CLINICAL DATA: Smoker with cough and chest pain.  Flu-like
symptoms.

CHEST - 2 VIEW 07/19/2009:

[view not recorded (1 of 2)]
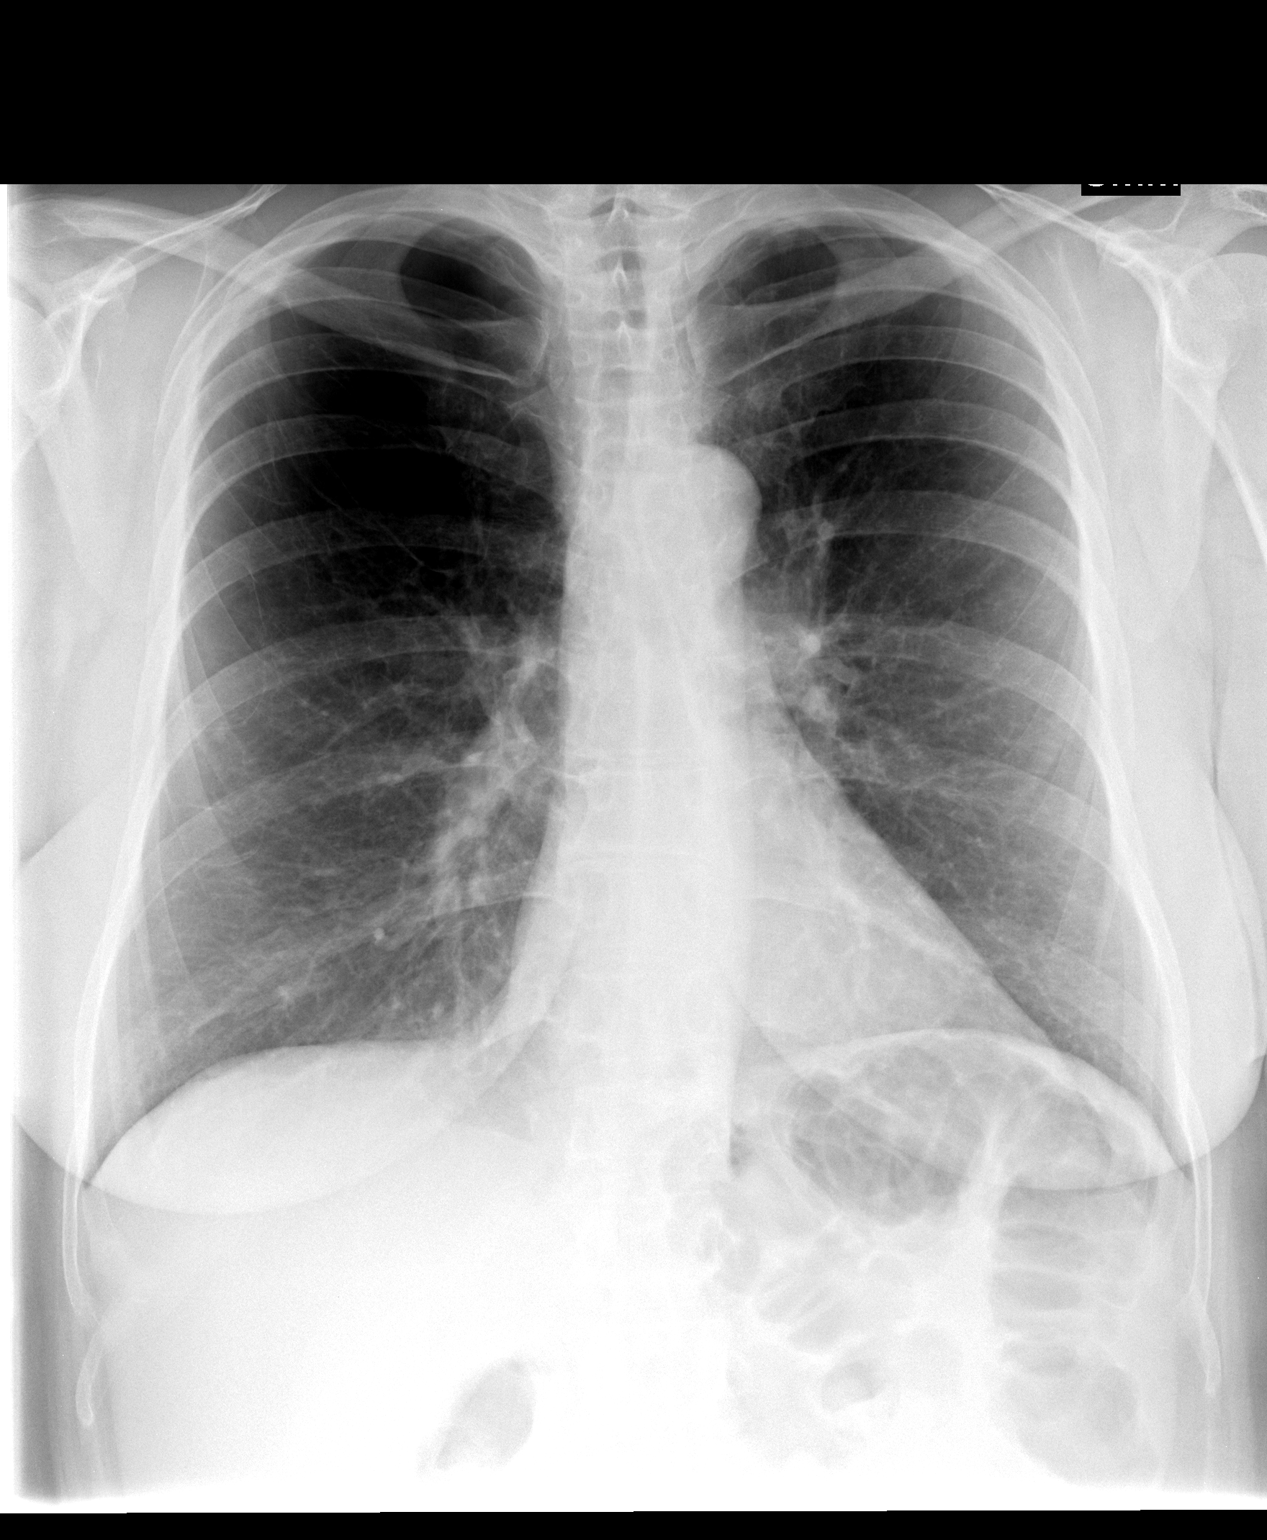

[view not recorded (2 of 2)]
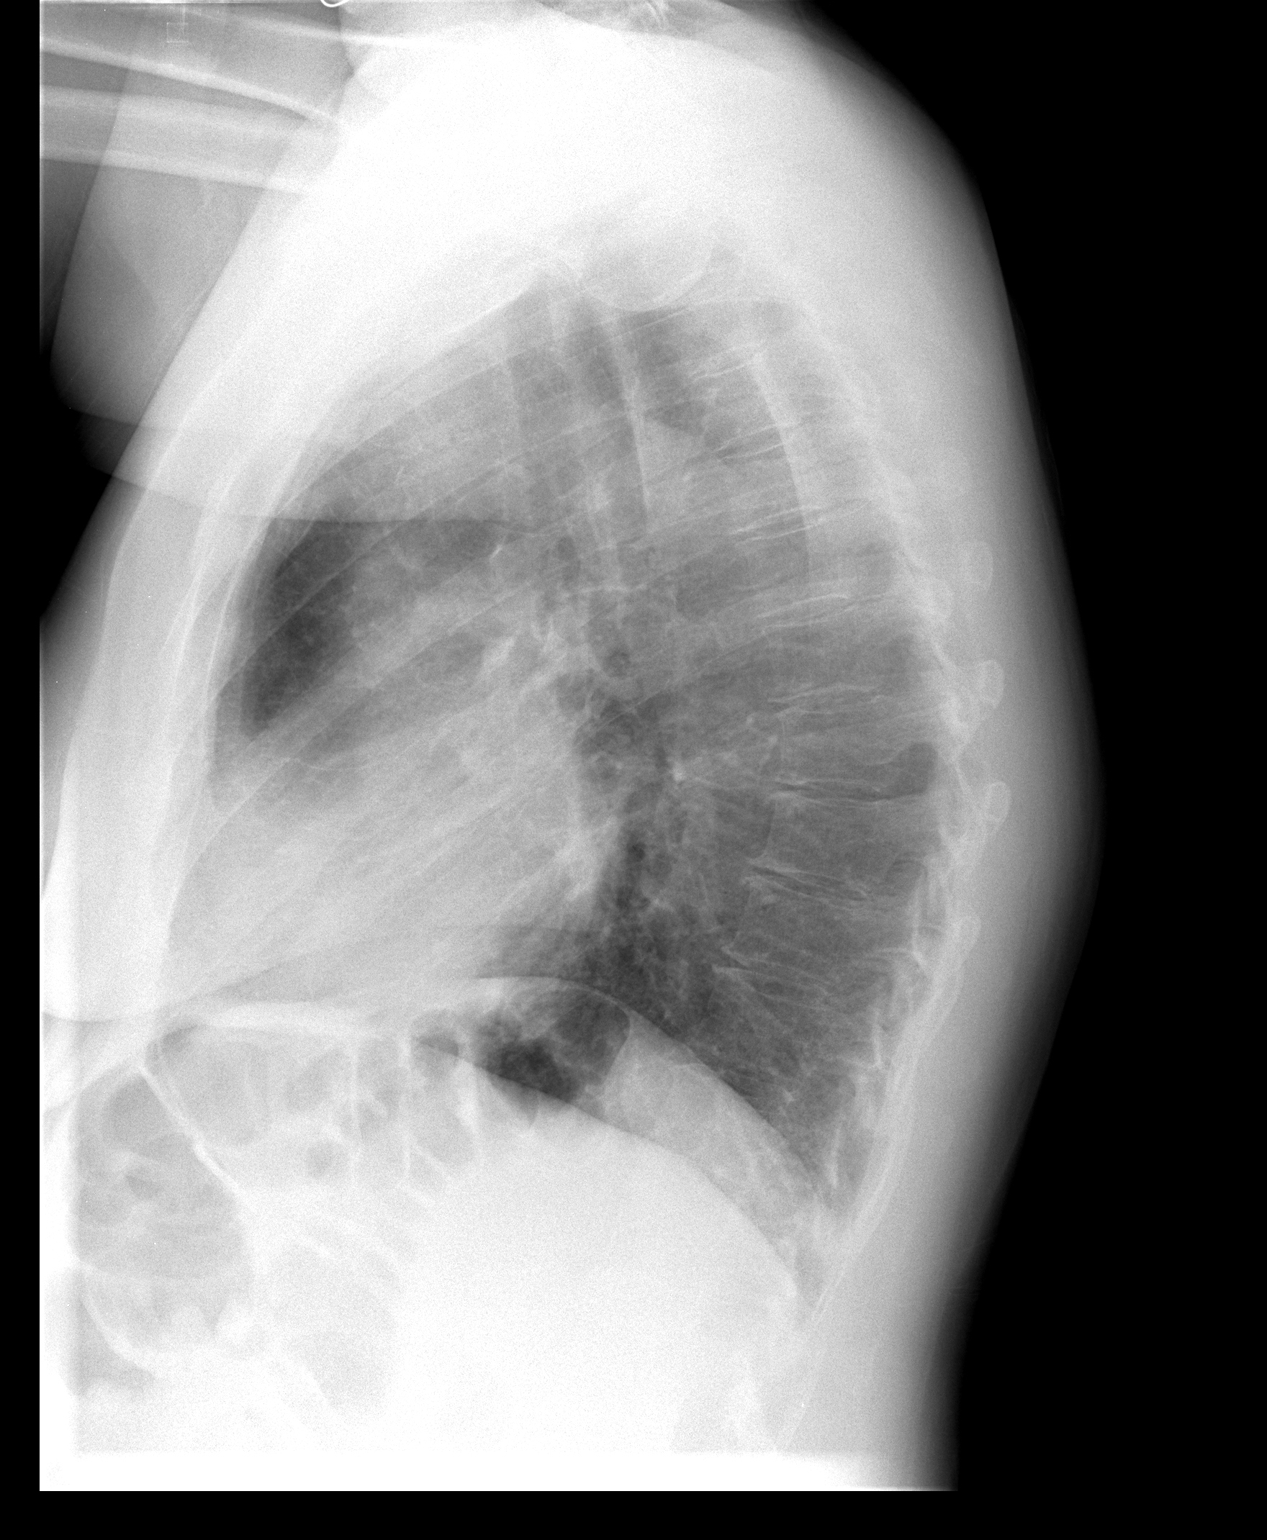

[2 of 2 positions shown; findings below may reference images not displayed]

FINDINGS: Heart size normal and stable.  Thoracic aorta mildly
atherosclerotic, unchanged.  Hilar and mediastinal contours
otherwise unremarkable.  Severe bullous emphysema in the right
upper lobe.  Similar moderate changes in the left upper lobe.
Vascular redistribution to the mid and lower lungs.  No new
pulmonary parenchymal abnormalities.  No pleural effusions.  Mild
degenerative changes in the thoracic spine and generalized
osteopenia.  No significant interval change.
IMPRESSION: COPD/emphysema.  No acute cardiopulmonary disease.

## 2011-11-20 DIAGNOSIS — I2510 Atherosclerotic heart disease of native coronary artery without angina pectoris: Secondary | ICD-10-CM | POA: Diagnosis not present

## 2011-11-20 DIAGNOSIS — R0602 Shortness of breath: Secondary | ICD-10-CM | POA: Diagnosis not present

## 2011-11-20 DIAGNOSIS — E782 Mixed hyperlipidemia: Secondary | ICD-10-CM | POA: Diagnosis not present

## 2011-11-21 ENCOUNTER — Telehealth: Payer: Self-pay | Admitting: *Deleted

## 2011-11-21 ENCOUNTER — Ambulatory Visit (HOSPITAL_COMMUNITY): Payer: Medicare Other

## 2011-11-21 ENCOUNTER — Other Ambulatory Visit (INDEPENDENT_AMBULATORY_CARE_PROVIDER_SITE_OTHER): Payer: Medicare Other

## 2011-11-21 DIAGNOSIS — R300 Dysuria: Secondary | ICD-10-CM | POA: Diagnosis not present

## 2011-11-21 LAB — URINALYSIS, ROUTINE W REFLEX MICROSCOPIC
Bilirubin Urine: NEGATIVE
Hgb urine dipstick: NEGATIVE
Ketones, ur: NEGATIVE
Leukocytes, UA: NEGATIVE
Nitrite: NEGATIVE
Specific Gravity, Urine: 1.015 (ref 1.000–1.030)
Total Protein, Urine: NEGATIVE
Urine Glucose: NEGATIVE
Urobilinogen, UA: 0.2 (ref 0.0–1.0)
pH: 5.5 (ref 5.0–8.0)

## 2011-11-21 NOTE — Telephone Encounter (Signed)
Fill out walk-in sheet stating need md to check urine for Klebsiella Pneumonia. Didn't know that hr mom had it. I just had heart attack back in May, but want to see if she has it... 11/21/11@12 :11pm/LMB

## 2011-11-21 NOTE — Telephone Encounter (Signed)
UA and Ucx entered -

## 2011-11-21 NOTE — Telephone Encounter (Signed)
Notified pt with md response.Michele KitchenMarland Kitchen6/20/13@12 :46pm/LMB

## 2011-11-23 LAB — URINE CULTURE: Colony Count: 4000

## 2011-11-25 ENCOUNTER — Encounter (HOSPITAL_COMMUNITY): Payer: Medicare Other

## 2011-11-27 ENCOUNTER — Encounter (HOSPITAL_COMMUNITY): Payer: Medicare Other

## 2011-11-28 ENCOUNTER — Encounter (HOSPITAL_COMMUNITY)
Admission: RE | Admit: 2011-11-28 | Discharge: 2011-11-28 | Disposition: A | Discharge status: Home / Self Care | Payer: Medicare Other | Source: Ambulatory Visit | Attending: Cardiology | Admitting: Cardiology

## 2011-11-28 NOTE — Progress Notes (Signed)
Cardiac Rehab Medication Review by a Pharmacist  Does the patient  feel that his/her medications are working for him/her?  yes  Has the patient been experiencing any side effects to the medications prescribed?  no  Does the patient measure his/her own blood pressure or blood glucose at home?  no   Does the patient have any problems obtaining medications due to transportation or finances?   no  Understanding of regimen: good Understanding of indications: fair Potential of compliance: fair    Pharmacist comments: Patient is doing ok today. Complaints of pain from abdominal issues. Wants to quit smoking and was encouraged to call 1-800-quit-now hotline for assistance. No other issues    Janace Litten, PharmD 11/28/2011 8:32 AM

## 2011-11-29 ENCOUNTER — Encounter (HOSPITAL_COMMUNITY): Payer: Medicare Other

## 2011-12-02 ENCOUNTER — Encounter (HOSPITAL_COMMUNITY): Payer: Medicare Other

## 2011-12-02 ENCOUNTER — Encounter (HOSPITAL_COMMUNITY)
Admission: RE | Admit: 2011-12-02 | Discharge: 2011-12-02 | Disposition: A | Discharge status: Home / Self Care | Payer: Medicare Other | Source: Ambulatory Visit | Attending: Cardiology | Admitting: Cardiology

## 2011-12-02 DIAGNOSIS — Z5189 Encounter for other specified aftercare: Secondary | ICD-10-CM | POA: Insufficient documentation

## 2011-12-02 DIAGNOSIS — I2119 ST elevation (STEMI) myocardial infarction involving other coronary artery of inferior wall: Secondary | ICD-10-CM | POA: Diagnosis not present

## 2011-12-02 DIAGNOSIS — I2510 Atherosclerotic heart disease of native coronary artery without angina pectoris: Secondary | ICD-10-CM | POA: Diagnosis not present

## 2011-12-02 NOTE — Progress Notes (Signed)
Pt started cardiac rehab today.  Pt tolerated light exercise without difficulty. Telemetry rhythm Sinus without ectopy, vital signs stable. Will continue to monitor the patient throughout  the program.

## 2011-12-04 ENCOUNTER — Encounter (HOSPITAL_COMMUNITY): Payer: Medicare Other

## 2011-12-04 ENCOUNTER — Encounter (HOSPITAL_COMMUNITY)
Admission: RE | Admit: 2011-12-04 | Discharge: 2011-12-04 | Disposition: A | Discharge status: Home / Self Care | Payer: Medicare Other | Source: Ambulatory Visit | Attending: Cardiology | Admitting: Cardiology

## 2011-12-04 NOTE — Progress Notes (Signed)
Reviewed home exercise with pt today.  Pt plans to walk and continue at [employer] for exercise.  Reviewed THR, pulse (needs practice, plans to attend Pulse class on Fri 7/5), RPE, sign and symptoms, NTG use, and when to call 911 or MD.  Pt voiced understanding. Fabio Pierce, MA, ACSM RCEP
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
## 2011-12-06 ENCOUNTER — Encounter (HOSPITAL_COMMUNITY)
Admission: RE | Admit: 2011-12-06 | Discharge: 2011-12-06 | Disposition: A | Discharge status: Home / Self Care | Payer: Medicare Other | Source: Ambulatory Visit | Attending: Cardiology | Admitting: Cardiology

## 2011-12-06 ENCOUNTER — Encounter (HOSPITAL_COMMUNITY): Payer: Medicare Other
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
## 2011-12-08 ENCOUNTER — Other Ambulatory Visit: Payer: Self-pay | Admitting: Internal Medicine

## 2011-12-09 ENCOUNTER — Encounter (HOSPITAL_COMMUNITY)
Admission: RE | Admit: 2011-12-09 | Discharge: 2011-12-09 | Disposition: A | Discharge status: Home / Self Care | Payer: Medicare Other | Source: Ambulatory Visit | Attending: Cardiology | Admitting: Cardiology

## 2011-12-09 ENCOUNTER — Encounter (HOSPITAL_COMMUNITY): Payer: Medicare Other

## 2011-12-09 NOTE — Telephone Encounter (Signed)
Faxed script back to [facility]... 12/09/11@1 :08pm/LMB

## 2011-12-11 ENCOUNTER — Encounter (HOSPITAL_COMMUNITY)
Admission: RE | Admit: 2011-12-11 | Discharge: 2011-12-11 | Disposition: A | Discharge status: Home / Self Care | Payer: Medicare Other | Source: Ambulatory Visit | Attending: Cardiology | Admitting: Cardiology

## 2011-12-11 ENCOUNTER — Encounter (HOSPITAL_COMMUNITY): Payer: Medicare Other
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
## 2011-12-13 ENCOUNTER — Encounter (HOSPITAL_COMMUNITY)
Admission: RE | Admit: 2011-12-13 | Discharge: 2011-12-13 | Disposition: A | Discharge status: Home / Self Care | Payer: Medicare Other | Source: Ambulatory Visit | Attending: Cardiology | Admitting: Cardiology

## 2011-12-13 ENCOUNTER — Encounter (HOSPITAL_COMMUNITY): Payer: Medicare Other

## 2011-12-16 ENCOUNTER — Encounter (HOSPITAL_COMMUNITY)
Admission: RE | Admit: 2011-12-16 | Discharge: 2011-12-16 | Disposition: A | Discharge status: Home / Self Care | Payer: Medicare Other | Source: Ambulatory Visit | Attending: Cardiology | Admitting: Cardiology

## 2011-12-16 ENCOUNTER — Encounter (HOSPITAL_COMMUNITY): Payer: Medicare Other

## 2011-12-17 ENCOUNTER — Ambulatory Visit (INDEPENDENT_AMBULATORY_CARE_PROVIDER_SITE_OTHER): Payer: Medicare Other | Admitting: Internal Medicine

## 2011-12-17 ENCOUNTER — Encounter: Payer: Self-pay | Admitting: Internal Medicine

## 2011-12-17 VITALS — BP 110/70 | HR 68 | Temp 98.1°F | Ht 65.0 in | Wt 157.0 lb

## 2011-12-17 DIAGNOSIS — J449 Chronic obstructive pulmonary disease, unspecified: Secondary | ICD-10-CM | POA: Diagnosis not present

## 2011-12-17 DIAGNOSIS — I2510 Atherosclerotic heart disease of native coronary artery without angina pectoris: Secondary | ICD-10-CM | POA: Diagnosis not present

## 2011-12-17 MED ORDER — BUDESONIDE-FORMOTEROL FUMARATE 80-4.5 MCG/ACT IN AERO: 2.0000 | INHALATION_SPRAY | Freq: Two times a day (BID) | RESPIRATORY_TRACT | Status: DC

## 2011-12-17 NOTE — Assessment & Plan Note (Signed)
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

## 2011-12-17 NOTE — Progress Notes (Signed)
Dr [name] decreased Michele [name]'s Toprol to 12.5 mg once a day. Lynnix said her right sided chest soreness resolved. No complaints of dizziness today.

## 2011-12-17 NOTE — Progress Notes (Signed)
Kit complained of feeling lightheaded when she walked outside yesterday.  Patient complained of feeling somewhat lightheaded this AM.  Orthostatic blood pressures checked. Charlii was given water symptoms resolved.Terianne also complained of having right sided chest soreness.  Upon auscultation lung fields with audible coarse breath sounds in the  right sided anterior field.  Lung fields clear otherwise.  Jamilynn has not smoked in 2 weeks.  I advised Pricella to use her PRN inhaler when she gets home.  Dr [name] notified of Raniya's complaints.  Faxed exercise flow sheets to Dr Elissa Hefty office for review. Navayah had no other complaints during exercise. Will continue to monitor the patient throughout  the program.

## 2011-12-17 NOTE — Assessment & Plan Note (Signed)
MI 10/2011 with 2 stents, then 3rd stent 6.2013 to [facility] Hospitalization/DC summary reviewed Continue working with cards and current med mgmt as ongoing

## 2011-12-17 NOTE — Patient Instructions (Signed)
It was good to see you today. we'll make referral for pulmonary function tests to evaluate the lung causes for your shortness of breath symptoms . Our office will contact you regarding appointment(s) once made. In meanwhile, start Symbicort inhaler everyday - use in addition to Albuterol inhaler (Albuterol only if/as needed for shortness of breath) Your prescription(s) have been submitted to your pharmacy. Please take as directed and contact our office if you believe you are having problem(s) with the medication(s). Continue working with your heart specialists as ongoing Please schedule followup in 3-4 months, call sooner if problems.
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
## 2011-12-18 ENCOUNTER — Encounter (HOSPITAL_COMMUNITY): Payer: Medicare Other

## 2011-12-18 ENCOUNTER — Encounter (HOSPITAL_COMMUNITY)
Admission: RE | Admit: 2011-12-18 | Discharge: 2011-12-18 | Disposition: A | Discharge status: Home / Self Care | Payer: Medicare Other | Source: Ambulatory Visit | Attending: Cardiology | Admitting: Cardiology

## 2011-12-18 NOTE — Progress Notes (Signed)
Michele [name] 56 y.o. female Nutrition Note Spoke with pt.  Nutrition Plan, Nutrition Survey, and cholesterol goals reviewed with pt. Pt is following Step 2 of the Therapeutic Lifestyle Changes diet. Per nutrition screen, pt wants to lose wt. Weight loss discussed. Per pt, "my wt is going in the wrong direction." Pt consuming only 1 serving of fruit and vegetables daily. Pt encouraged to increase fruit and vegetable intake to a minimum of 5 servings daily. Per discussion, pt has a lot of stress in her life. 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 Pt encouraged to focus on wt maintenance until tobacco cessation has been successful for several months. Pt expressed understanding. Methods to cope with stress discussed (e.g. Tai chi, Meditation, Yoga, walking, etc). Pt encouraged to discuss exercise options with Fabio Pierce, EP. According to pt's most recent A1c, pt is pre-diabetic. Pt was unaware of pre-diabetes. Pt states her mother is diabetic. Pre-diabetes basics discussed and pt encouraged to discuss pre-diabetes with her PCP further. Pt expressed understanding.  Nutrition Diagnosis   Food-and nutrition-related knowledge deficit related to lack of exposure to information as related to diagnosis of: ? CVD ? Pre-DM (A1c 6.2)    Nutrition RX/ Estimated Daily Nutrition Needs for: wt maintenance 1850-2150 Kcal, 60-70 gm fat, 12-14 gm sat fat, 1.8-2.1 gm trans-fat, <1500 mg sodium   Nutrition Intervention   [name] individual nutrition plan including cholesterol goals reviewed with pt.   Benefits of adopting Therapeutic Lifestyle Changes discussed when Medficts reviewed.   Pt to attend the Portion Distortion class   Pt to attend the  ? Nutrition I class                     ? Nutrition II class   Pt given handouts for: ? wt loss ? pre-DM     Continue client-centered nutrition education by RD, as part of interdisciplinary care. Goal(s)   Pt to describe the benefit of including  fruits, vegetables, whole grains, and low-fat dairy products in a heart healthy meal plan.   Pt able to name foods that affect blood glucose  Monitor and Evaluate progress toward nutrition goal with team.

## 2011-12-19 DIAGNOSIS — E782 Mixed hyperlipidemia: Secondary | ICD-10-CM | POA: Diagnosis not present

## 2011-12-19 DIAGNOSIS — I2510 Atherosclerotic heart disease of native coronary artery without angina pectoris: Secondary | ICD-10-CM | POA: Diagnosis not present

## 2011-12-19 DIAGNOSIS — Z9861 Coronary angioplasty status: Secondary | ICD-10-CM | POA: Diagnosis not present

## 2011-12-20 ENCOUNTER — Encounter (HOSPITAL_COMMUNITY)
Admission: RE | Admit: 2011-12-20 | Discharge: 2011-12-20 | Disposition: A | Discharge status: Home / Self Care | Payer: Medicare Other | Source: Ambulatory Visit | Attending: Cardiology | Admitting: Cardiology

## 2011-12-20 ENCOUNTER — Encounter (HOSPITAL_COMMUNITY): Payer: Medicare Other

## 2011-12-23 ENCOUNTER — Ambulatory Visit (INDEPENDENT_AMBULATORY_CARE_PROVIDER_SITE_OTHER): Payer: Medicare Other | Admitting: Internal Medicine

## 2011-12-23 ENCOUNTER — Encounter (HOSPITAL_COMMUNITY): Payer: Medicare Other

## 2011-12-23 DIAGNOSIS — J449 Chronic obstructive pulmonary disease, unspecified: Secondary | ICD-10-CM

## 2011-12-23 LAB — PULMONARY FUNCTION TEST

## 2011-12-23 NOTE — Progress Notes (Signed)
PFT Done. Jennifer Castillo, CMA  

## 2011-12-25 ENCOUNTER — Encounter (HOSPITAL_COMMUNITY)
Admission: RE | Admit: 2011-12-25 | Discharge: 2011-12-25 | Disposition: A | Discharge status: Home / Self Care | Payer: Medicare Other | Source: Ambulatory Visit | Attending: Cardiology | Admitting: Cardiology

## 2011-12-25 ENCOUNTER — Telehealth: Payer: Self-pay | Admitting: Internal Medicine

## 2011-12-25 ENCOUNTER — Encounter (HOSPITAL_COMMUNITY): Payer: Medicare Other

## 2011-12-25 NOTE — Telephone Encounter (Signed)
Forward 4 pages to Dr. Rene Paci for review on 12-25-11 ym

## 2011-12-27 ENCOUNTER — Encounter (HOSPITAL_COMMUNITY)
Admission: RE | Admit: 2011-12-27 | Discharge: 2011-12-27 | Disposition: A | Discharge status: Home / Self Care | Payer: Medicare Other | Source: Ambulatory Visit | Attending: Cardiology | Admitting: Cardiology

## 2011-12-27 ENCOUNTER — Encounter (HOSPITAL_COMMUNITY): Payer: Medicare Other

## 2011-12-30 ENCOUNTER — Encounter (HOSPITAL_COMMUNITY): Payer: Medicare Other

## 2012-01-01 ENCOUNTER — Ambulatory Visit (INDEPENDENT_AMBULATORY_CARE_PROVIDER_SITE_OTHER): Payer: Medicare Other | Admitting: Internal Medicine

## 2012-01-01 ENCOUNTER — Encounter (HOSPITAL_COMMUNITY): Payer: Medicare Other

## 2012-01-01 ENCOUNTER — Encounter: Payer: Self-pay | Admitting: Internal Medicine

## 2012-01-01 VITALS — BP 118/72 | HR 80 | Temp 97.7°F | Ht 65.0 in | Wt 156.4 lb

## 2012-01-01 DIAGNOSIS — J449 Chronic obstructive pulmonary disease, unspecified: Secondary | ICD-10-CM

## 2012-01-01 DIAGNOSIS — J40 Bronchitis, not specified as acute or chronic: Secondary | ICD-10-CM | POA: Diagnosis not present

## 2012-01-01 DIAGNOSIS — F411 Generalized anxiety disorder: Secondary | ICD-10-CM | POA: Diagnosis not present

## 2012-01-01 DIAGNOSIS — I2510 Atherosclerotic heart disease of native coronary artery without angina pectoris: Secondary | ICD-10-CM

## 2012-01-01 MED ORDER — TIOTROPIUM BROMIDE MONOHYDRATE 18 MCG IN CAPS: 18.0000 ug | ORAL_CAPSULE | Freq: Every day | RESPIRATORY_TRACT | Status: DC

## 2012-01-01 MED ORDER — LEVOFLOXACIN 500 MG PO TABS: 500.0000 mg | ORAL_TABLET | Freq: Every day | ORAL | Status: AC
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
## 2012-01-01 NOTE — Assessment & Plan Note (Signed)
PFTs 07/2010 and 12/2011 reviewed - mod obst dz - intol of symbicort MDI side effects - stopped same Start spiriva -and continue Albuterol prn as pt feels this is benificial Encouraged continuation of tobacco cessation (quit 11/29/11) to prevent progression of pulm dz and other ASD complications

## 2012-01-01 NOTE — Assessment & Plan Note (Signed)
MI 10/2011 with 2 stents, then 3rd stent 11/2011 to RCA Continue working with cards and current med mgmt as ongoing

## 2012-01-01 NOTE — Assessment & Plan Note (Signed)
Significant component of anxiety evident on exam today -  discussed med options available to use with scheduled BZ and pt declines same- Previously changed from xanax to klonopin 06/2010 to help her anxiety control - refill same today Ok to continue same and refer behav health counseling to augment tx of same - ?topamax Never started prozac as rx'd 10/2010 Reviewed topamax not indicated for bipolar or anxiety - will defer to mental health to consider this or alt tx as needed

## 2012-01-01 NOTE — Patient Instructions (Signed)
It was good to see you today. We have reviewed your interval records including labs and tests today we'll make referral for mental health to evaluate/treat your anxiety symptoms . Our office will contact you regarding appointment(s) once made. Stop Symbicort start Spririva inhaler everyday - use in addition to Albuterol inhaler (Albuterol only if/as needed for shortness of breath) Also Levaquin antibiotics daily x 1 week for bronchitis/cough Your prescription(s) have been submitted to your pharmacy. Please take as directed and contact our office if you believe you are having problem(s) with the medication(s). Continue working with your heart specialists as ongoing

## 2012-01-03 ENCOUNTER — Encounter (HOSPITAL_COMMUNITY)
Admission: RE | Admit: 2012-01-03 | Discharge: 2012-01-03 | Disposition: A | Discharge status: Home / Self Care | Payer: Medicare Other | Source: Ambulatory Visit | Attending: Cardiology | Admitting: Cardiology

## 2012-01-03 ENCOUNTER — Encounter (HOSPITAL_COMMUNITY): Payer: Medicare Other

## 2012-01-03 DIAGNOSIS — Z5189 Encounter for other specified aftercare: Secondary | ICD-10-CM | POA: Diagnosis not present

## 2012-01-03 DIAGNOSIS — I2119 ST elevation (STEMI) myocardial infarction involving other coronary artery of inferior wall: Secondary | ICD-10-CM | POA: Insufficient documentation

## 2012-01-03 DIAGNOSIS — I2510 Atherosclerotic heart disease of native coronary artery without angina pectoris: Secondary | ICD-10-CM | POA: Insufficient documentation

## 2012-01-03 NOTE — Progress Notes (Signed)
Received a call yesterday from JC RN at Dr Elissa Hefty office that [facility] had recent bronchitis and some left arm pain at home that goes away with the use of ice. JC said  Michele [name] is okay to return to exercise today if she feels up to it. [name] denies left arm pain this morning. Will continue to monitor the patient throughout  the program.

## 2012-01-06 ENCOUNTER — Telehealth: Payer: Self-pay | Admitting: Internal Medicine

## 2012-01-06 ENCOUNTER — Encounter (HOSPITAL_COMMUNITY): Payer: Medicare Other

## 2012-01-06 ENCOUNTER — Telehealth: Payer: Self-pay

## 2012-01-06 ENCOUNTER — Encounter (HOSPITAL_COMMUNITY)
Admission: RE | Admit: 2012-01-06 | Discharge: 2012-01-06 | Disposition: A | Discharge status: Home / Self Care | Payer: Medicare Other | Source: Ambulatory Visit | Attending: Cardiology | Admitting: Cardiology

## 2012-01-06 DIAGNOSIS — I2119 ST elevation (STEMI) myocardial infarction involving other coronary artery of inferior wall: Secondary | ICD-10-CM | POA: Diagnosis not present

## 2012-01-06 DIAGNOSIS — I2510 Atherosclerotic heart disease of native coronary artery without angina pectoris: Secondary | ICD-10-CM | POA: Diagnosis not present

## 2012-01-06 DIAGNOSIS — Z5189 Encounter for other specified aftercare: Secondary | ICD-10-CM | POA: Diagnosis not present

## 2012-01-06 MED ORDER — NICOTINE 10 MG IN INHA: 1.0000 | RESPIRATORY_TRACT | Status: DC | PRN

## 2012-01-06 NOTE — Telephone Encounter (Signed)
A) current dose Klonopin (1mg  TID) is NOT low dose - as I advised 01/01/12 OV, pt should see psyc/behavioral health provider for additional changes on her anxiety meds - no prescription change by me. [facility] provided info to pt on available providers, but must call on her own to schedule appt  B) nicotrol inhaler erx done as per prior phone note

## 2012-01-06 NOTE — Telephone Encounter (Signed)
Pt is requesting Nicotrol inhaler, please advise.

## 2012-01-06 NOTE — Telephone Encounter (Signed)
Pt informed via VM of Rx/pharmacy 

## 2012-01-06 NOTE — Telephone Encounter (Signed)
Pt states that she thinks that increases in her anxiety level causes her to start smoking again yesterday. Pt says that VAL had previously mention adding a antidepressant to help with these sxs but she would prefer to increase klonopin and she adds that her Cardiologist thinks this would be best also. Please advise.   Pt advised of Rx/pharmacy again.

## 2012-01-06 NOTE — Telephone Encounter (Signed)
Left message on machine for pt to return my call  

## 2012-01-06 NOTE — Telephone Encounter (Signed)
erx done as requested - but may not be covered Good luck with continued smoking cessation efforts

## 2012-01-06 NOTE — Telephone Encounter (Signed)
Caller: Michele [name]/Patient; PCP: Rene Paci; CB#: [phone];  Call regarding Smoking Helps.  She has not smoked in the past 2.5 years and then resttarted 01/05/12 PM..  She is calling to get the [employer].  States this is the only thing that works for her.  Patches break her out in a rash.  The nosespray causes nasal bleeding and the pills to quit smoking cause her seizures.  As she cannot take anything but the inhaler will her insurance pay for this?   She wants to started the Nicotrol Inhalers again. If insurance will not pay pt will pay for this on her own.    Patient symptomatic for anything else at this time.   She states her Cardiologist did question "why I am on such a low dose of Klonopin" (take 1 mg PO TID) with her anxiety with trying to quit smoking.

## 2012-01-07 ENCOUNTER — Telehealth: Payer: Self-pay | Admitting: *Deleted

## 2012-01-07 NOTE — Telephone Encounter (Signed)
Pt advised on MD advisement regarding Klonopin. Pt also informed that PA for Nicotrol has been received and she will be contacted once completed (may take a few days and likely not approved per MD's previous note). Pt expressed understanding.

## 2012-01-07 NOTE — Telephone Encounter (Signed)
Received fax needing PA on nicotrol inahler. Contacted insurance fax over PA form place on md desk for completion... 01/07/12@3 :32pm/LMB

## 2012-01-08 ENCOUNTER — Encounter (HOSPITAL_COMMUNITY): Payer: Medicare Other

## 2012-01-08 NOTE — Telephone Encounter (Signed)
completed

## 2012-01-09 NOTE — Telephone Encounter (Signed)
Received PA back med has been denied. Place on md desk to review... 01/09/12@12 :56pm/LMB

## 2012-01-09 NOTE — Telephone Encounter (Signed)
Faxed PA back to insurance waiting on approval status... 01/09/12@8 :25am/LMB

## 2012-01-10 ENCOUNTER — Encounter (HOSPITAL_COMMUNITY): Payer: Medicare Other

## 2012-01-10 ENCOUNTER — Encounter (HOSPITAL_COMMUNITY)
Admission: RE | Admit: 2012-01-10 | Discharge: 2012-01-10 | Disposition: A | Discharge status: Home / Self Care | Payer: Medicare Other | Source: Ambulatory Visit | Attending: Cardiology | Admitting: Cardiology

## 2012-01-10 DIAGNOSIS — I2510 Atherosclerotic heart disease of native coronary artery without angina pectoris: Secondary | ICD-10-CM | POA: Diagnosis not present

## 2012-01-10 DIAGNOSIS — I2119 ST elevation (STEMI) myocardial infarction involving other coronary artery of inferior wall: Secondary | ICD-10-CM | POA: Diagnosis not present

## 2012-01-10 DIAGNOSIS — Z5189 Encounter for other specified aftercare: Secondary | ICD-10-CM | POA: Diagnosis not present

## 2012-01-10 NOTE — Telephone Encounter (Signed)
Called pt no answer LMOM md response... 01/10/12@9 :48am/LMB

## 2012-01-10 NOTE — Telephone Encounter (Signed)
No alternative - pt may use e-cig or other OTC option

## 2012-01-12 ENCOUNTER — Other Ambulatory Visit: Payer: Self-pay | Admitting: Internal Medicine

## 2012-01-13 ENCOUNTER — Encounter (HOSPITAL_COMMUNITY): Payer: Medicare Other

## 2012-01-13 ENCOUNTER — Encounter (HOSPITAL_COMMUNITY)
Admission: RE | Admit: 2012-01-13 | Discharge: 2012-01-13 | Disposition: A | Discharge status: Home / Self Care | Payer: Medicare Other | Source: Ambulatory Visit | Attending: Cardiology | Admitting: Cardiology

## 2012-01-13 ENCOUNTER — Ambulatory Visit (HOSPITAL_COMMUNITY)
Admission: RE | Admit: 2012-01-13 | Discharge: 2012-01-13 | Disposition: A | Discharge status: Home / Self Care | Payer: Medicare Other | Source: Ambulatory Visit | Attending: Cardiology | Admitting: Cardiology

## 2012-01-13 DIAGNOSIS — R42 Dizziness and giddiness: Secondary | ICD-10-CM | POA: Diagnosis not present

## 2012-01-13 DIAGNOSIS — Z5189 Encounter for other specified aftercare: Secondary | ICD-10-CM | POA: Diagnosis not present

## 2012-01-13 DIAGNOSIS — R51 Headache: Secondary | ICD-10-CM | POA: Insufficient documentation

## 2012-01-13 DIAGNOSIS — M542 Cervicalgia: Secondary | ICD-10-CM | POA: Insufficient documentation

## 2012-01-13 DIAGNOSIS — R5381 Other malaise: Secondary | ICD-10-CM | POA: Diagnosis not present

## 2012-01-13 DIAGNOSIS — I2119 ST elevation (STEMI) myocardial infarction involving other coronary artery of inferior wall: Secondary | ICD-10-CM | POA: Diagnosis not present

## 2012-01-13 DIAGNOSIS — Z79899 Other long term (current) drug therapy: Secondary | ICD-10-CM | POA: Diagnosis not present

## 2012-01-13 DIAGNOSIS — R109 Unspecified abdominal pain: Secondary | ICD-10-CM | POA: Diagnosis not present

## 2012-01-13 DIAGNOSIS — I2510 Atherosclerotic heart disease of native coronary artery without angina pectoris: Secondary | ICD-10-CM | POA: Diagnosis not present

## 2012-01-13 NOTE — Telephone Encounter (Signed)
Faxed script back to [facility]... 01/13/12@9 :08am/LMB

## 2012-01-13 NOTE — Progress Notes (Signed)
Michele [name] reported having some abdominal discomfort, a headache and some dizziness on the nustep. Exercise stopped.  Michele [name] also reported having right neck pain. ECG tracing showed sinus tach 126.  Michele Finlay PA called and notified.  Michele [name] came to cardiac rehab to evaluate the patient.  12 lead ECG ordered by [facility] showed normal Sinus rhythm.  Michele [name] examined the patient and said she can go home.  Michele [name] complained of having muscle aching in her legs and lower back. I had called and left a message to call the patient about muscle aches before she had complaints on the nustep. Michele [name] said the patient can return to exercise on Wednesday. Patient left without further complaints.[name]

## 2012-01-14 DIAGNOSIS — Z79899 Other long term (current) drug therapy: Secondary | ICD-10-CM | POA: Diagnosis not present

## 2012-01-14 DIAGNOSIS — E119 Type 2 diabetes mellitus without complications: Secondary | ICD-10-CM | POA: Diagnosis not present

## 2012-01-14 DIAGNOSIS — E782 Mixed hyperlipidemia: Secondary | ICD-10-CM | POA: Diagnosis not present

## 2012-01-15 ENCOUNTER — Encounter (HOSPITAL_COMMUNITY)
Admission: RE | Admit: 2012-01-15 | Discharge: 2012-01-15 | Disposition: A | Discharge status: Home / Self Care | Payer: Medicare Other | Source: Ambulatory Visit | Attending: Cardiology | Admitting: Cardiology

## 2012-01-15 ENCOUNTER — Encounter (HOSPITAL_COMMUNITY): Payer: Medicare Other

## 2012-01-15 DIAGNOSIS — Z5189 Encounter for other specified aftercare: Secondary | ICD-10-CM | POA: Diagnosis not present

## 2012-01-15 DIAGNOSIS — I2119 ST elevation (STEMI) myocardial infarction involving other coronary artery of inferior wall: Secondary | ICD-10-CM | POA: Diagnosis not present

## 2012-01-15 DIAGNOSIS — I2510 Atherosclerotic heart disease of native coronary artery without angina pectoris: Secondary | ICD-10-CM | POA: Diagnosis not present

## 2012-01-17 ENCOUNTER — Other Ambulatory Visit (HOSPITAL_COMMUNITY): Payer: Self-pay | Admitting: Cardiology

## 2012-01-17 ENCOUNTER — Encounter (HOSPITAL_COMMUNITY): Payer: Medicare Other

## 2012-01-17 DIAGNOSIS — I70219 Atherosclerosis of native arteries of extremities with intermittent claudication, unspecified extremity: Secondary | ICD-10-CM | POA: Diagnosis not present

## 2012-01-17 DIAGNOSIS — R2240 Localized swelling, mass and lump, unspecified lower limb: Secondary | ICD-10-CM

## 2012-01-20 ENCOUNTER — Encounter (HOSPITAL_COMMUNITY)
Admission: RE | Admit: 2012-01-20 | Discharge: 2012-01-20 | Disposition: A | Discharge status: Home / Self Care | Payer: Medicare Other | Source: Ambulatory Visit | Attending: Cardiology | Admitting: Cardiology

## 2012-01-20 ENCOUNTER — Encounter (HOSPITAL_COMMUNITY): Payer: Medicare Other

## 2012-01-20 DIAGNOSIS — I2510 Atherosclerotic heart disease of native coronary artery without angina pectoris: Secondary | ICD-10-CM | POA: Diagnosis not present

## 2012-01-20 DIAGNOSIS — I2119 ST elevation (STEMI) myocardial infarction involving other coronary artery of inferior wall: Secondary | ICD-10-CM | POA: Diagnosis not present

## 2012-01-20 DIAGNOSIS — Z5189 Encounter for other specified aftercare: Secondary | ICD-10-CM | POA: Diagnosis not present

## 2012-01-21 ENCOUNTER — Ambulatory Visit (HOSPITAL_COMMUNITY)
Admission: RE | Admit: 2012-01-21 | Discharge: 2012-01-21 | Disposition: A | Discharge status: Home / Self Care | Payer: Medicare Other | Source: Ambulatory Visit | Attending: Cardiology | Admitting: Cardiology

## 2012-01-21 ENCOUNTER — Other Ambulatory Visit (HOSPITAL_COMMUNITY): Payer: Self-pay | Admitting: Cardiology

## 2012-01-21 ENCOUNTER — Telehealth: Payer: Self-pay | Admitting: Internal Medicine

## 2012-01-21 DIAGNOSIS — R2240 Localized swelling, mass and lump, unspecified lower limb: Secondary | ICD-10-CM

## 2012-01-21 DIAGNOSIS — D1620 Benign neoplasm of long bones of unspecified lower limb: Secondary | ICD-10-CM | POA: Diagnosis not present

## 2012-01-21 DIAGNOSIS — X58XXXA Exposure to other specified factors, initial encounter: Secondary | ICD-10-CM | POA: Insufficient documentation

## 2012-01-21 DIAGNOSIS — S70259A Superficial foreign body, unspecified hip, initial encounter: Secondary | ICD-10-CM | POA: Diagnosis not present

## 2012-01-21 DIAGNOSIS — S90559A Superficial foreign body, unspecified ankle, initial encounter: Secondary | ICD-10-CM | POA: Diagnosis not present

## 2012-01-21 DIAGNOSIS — M79609 Pain in unspecified limb: Secondary | ICD-10-CM | POA: Diagnosis not present

## 2012-01-21 DIAGNOSIS — S81009A Unspecified open wound, unspecified knee, initial encounter: Secondary | ICD-10-CM | POA: Diagnosis not present

## 2012-01-21 MED ORDER — IOHEXOL 300 MG/ML  SOLN
100.0000 mL | Freq: Once | INTRAMUSCULAR | Status: AC | PRN
  Administered 2012-01-21: 100 mL via INTRAVENOUS

## 2012-01-21 NOTE — Telephone Encounter (Signed)
Caller: Kenzlei/Patient; Patient Name: Michele [name]; PCP: Rene Paci; Best Callback Phone Number: [phone]; Call regarding Abdominal Pain; onset 01/20/12; having burning in her stomach; has cardiac rehab every monday, Wednesday and Friday; requesting appt for 01/23/12; afebrile; denies diarrhea or vomiting; All emergent symptoms of Abdominal Pain protocol ruled out except "new onset constant mild or aching lower abdominal pain"; appt scheduled for 01/23/12 at 1115 with Dr.Norins

## 2012-01-22 ENCOUNTER — Encounter (HOSPITAL_COMMUNITY): Payer: Medicare Other

## 2012-01-22 DIAGNOSIS — I2119 ST elevation (STEMI) myocardial infarction involving other coronary artery of inferior wall: Secondary | ICD-10-CM | POA: Diagnosis not present

## 2012-01-22 DIAGNOSIS — I70219 Atherosclerosis of native arteries of extremities with intermittent claudication, unspecified extremity: Secondary | ICD-10-CM | POA: Diagnosis not present

## 2012-01-22 DIAGNOSIS — E782 Mixed hyperlipidemia: Secondary | ICD-10-CM | POA: Diagnosis not present

## 2012-01-22 DIAGNOSIS — I2510 Atherosclerotic heart disease of native coronary artery without angina pectoris: Secondary | ICD-10-CM | POA: Diagnosis not present

## 2012-01-23 ENCOUNTER — Ambulatory Visit: Payer: Medicare Other | Admitting: Internal Medicine

## 2012-01-24 ENCOUNTER — Encounter (HOSPITAL_COMMUNITY): Payer: Medicare Other

## 2012-01-27 ENCOUNTER — Encounter (HOSPITAL_COMMUNITY): Payer: Medicare Other

## 2012-01-27 ENCOUNTER — Encounter (HOSPITAL_COMMUNITY)
Admission: RE | Admit: 2012-01-27 | Discharge: 2012-01-27 | Disposition: A | Discharge status: Home / Self Care | Payer: Medicare Other | Source: Ambulatory Visit | Attending: Cardiology | Admitting: Cardiology

## 2012-01-27 DIAGNOSIS — I2510 Atherosclerotic heart disease of native coronary artery without angina pectoris: Secondary | ICD-10-CM | POA: Diagnosis not present

## 2012-01-27 DIAGNOSIS — I2119 ST elevation (STEMI) myocardial infarction involving other coronary artery of inferior wall: Secondary | ICD-10-CM | POA: Diagnosis not present

## 2012-01-27 DIAGNOSIS — Z5189 Encounter for other specified aftercare: Secondary | ICD-10-CM | POA: Diagnosis not present

## 2012-01-28 ENCOUNTER — Ambulatory Visit (INDEPENDENT_AMBULATORY_CARE_PROVIDER_SITE_OTHER): Payer: Medicare Other | Admitting: Internal Medicine

## 2012-01-28 ENCOUNTER — Encounter: Payer: Self-pay | Admitting: Internal Medicine

## 2012-01-28 ENCOUNTER — Other Ambulatory Visit (INDEPENDENT_AMBULATORY_CARE_PROVIDER_SITE_OTHER): Payer: Medicare Other

## 2012-01-28 VITALS — BP 102/62 | HR 77 | Temp 97.6°F | Ht 65.0 in | Wt 156.4 lb

## 2012-01-28 DIAGNOSIS — R300 Dysuria: Secondary | ICD-10-CM

## 2012-01-28 DIAGNOSIS — R109 Unspecified abdominal pain: Secondary | ICD-10-CM

## 2012-01-28 LAB — HEPATIC FUNCTION PANEL
ALT: 20 U/L (ref 0–35)
AST: 19 U/L (ref 0–37)
Albumin: 4.1 g/dL (ref 3.5–5.2)
Alkaline Phosphatase: 72 U/L (ref 39–117)
Bilirubin, Direct: 0.1 mg/dL (ref 0.0–0.3)
Total Bilirubin: 0.5 mg/dL (ref 0.3–1.2)
Total Protein: 6.9 g/dL (ref 6.0–8.3)

## 2012-01-28 LAB — BASIC METABOLIC PANEL
BUN: 18 mg/dL (ref 6–23)
CO2: 27 mEq/L (ref 19–32)
Calcium: 9.7 mg/dL (ref 8.4–10.5)
Chloride: 107 mEq/L (ref 96–112)
Creatinine, Ser: 0.9 mg/dL (ref 0.4–1.2)
GFR: 72.07 mL/min (ref >60.00)
Glucose, Bld: 89 mg/dL (ref 70–99)
Potassium: 5.6 mEq/L — ABNORMAL HIGH (ref 3.5–5.1)
Sodium: 141 mEq/L (ref 135–145)

## 2012-01-28 LAB — URINALYSIS, ROUTINE W REFLEX MICROSCOPIC
Bilirubin Urine: NEGATIVE
Hgb urine dipstick: NEGATIVE
Ketones, ur: NEGATIVE
Leukocytes, UA: NEGATIVE
Nitrite: NEGATIVE
Specific Gravity, Urine: 1.01 (ref 1.000–1.030)
Total Protein, Urine: NEGATIVE
Urine Glucose: NEGATIVE
Urobilinogen, UA: 0.2 (ref 0.0–1.0)
pH: 7.5 (ref 5.0–8.0)

## 2012-01-28 LAB — CBC WITH DIFFERENTIAL/PLATELET
Basophils Absolute: 0 10*3/uL (ref 0.0–0.1)
Basophils Relative: 0.3 % (ref 0.0–3.0)
Eosinophils Absolute: 0.1 10*3/uL (ref 0.0–0.7)
Eosinophils Relative: 0.6 % (ref 0.0–5.0)
HCT: 41.9 % (ref 36.0–46.0)
Hemoglobin: 13.9 g/dL (ref 12.0–15.0)
Lymphocytes Relative: 17.9 % (ref 12.0–46.0)
Lymphs Abs: 1.4 10*3/uL (ref 0.7–4.0)
MCHC: 33.1 g/dL (ref 30.0–36.0)
MCV: 89 fl (ref 78.0–100.0)
Monocytes Absolute: 0.4 10*3/uL (ref 0.1–1.0)
Monocytes Relative: 5.3 % (ref 3.0–12.0)
Neutro Abs: 6.1 10*3/uL (ref 1.4–7.7)
Neutrophils Relative %: 75.9 % (ref 43.0–77.0)
Platelets: 191 10*3/uL (ref 150.0–400.0)
RBC: 4.71 Mil/uL (ref 3.87–5.11)
RDW: 13.4 % (ref 11.5–14.6)
WBC: 8.1 10*3/uL (ref 4.5–10.5)

## 2012-01-28 LAB — LIPASE: Lipase: 28 U/L (ref 11.0–59.0)
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
## 2012-01-28 NOTE — Patient Instructions (Signed)
It was good to see you today. Test(s) ordered today. Your results will be called to you after review (48-72hours after test completion). If any changes need to be made, you will be notified at that time. Medications reviewed and updated, no changes at this time.

## 2012-01-29 ENCOUNTER — Encounter (HOSPITAL_COMMUNITY)
Admission: RE | Admit: 2012-01-29 | Discharge: 2012-01-29 | Disposition: A | Discharge status: Home / Self Care | Payer: Medicare Other | Source: Ambulatory Visit | Attending: Cardiology | Admitting: Cardiology

## 2012-01-29 ENCOUNTER — Encounter (HOSPITAL_COMMUNITY): Payer: Medicare Other

## 2012-01-29 DIAGNOSIS — I2510 Atherosclerotic heart disease of native coronary artery without angina pectoris: Secondary | ICD-10-CM | POA: Diagnosis not present

## 2012-01-29 DIAGNOSIS — I2119 ST elevation (STEMI) myocardial infarction involving other coronary artery of inferior wall: Secondary | ICD-10-CM | POA: Diagnosis not present

## 2012-01-29 DIAGNOSIS — Z5189 Encounter for other specified aftercare: Secondary | ICD-10-CM | POA: Diagnosis not present

## 2012-01-29 NOTE — Progress Notes (Signed)
Pt in today for cardiac rehab.  Pt with weight gain of 1.2 kg.  Pt denies any sob or swelling and reports that she ate at a [place] on yesterday and had taco salad.  Pt has been this weight before and heavier at times.  Will continue to monitor.  As pt was leaving exercise, she reported that while she was on the stepper she had one sharp pain in her abdominal area.  Noted that she was seen by her primary Md on 8/27 for Abdominal pain.  Pt at present is pain free.  Continue to monitor.

## 2012-01-30 ENCOUNTER — Ambulatory Visit
Admission: RE | Admit: 2012-01-30 | Discharge: 2012-01-30 | Disposition: A | Discharge status: Home / Self Care | Payer: Medicare Other | Source: Ambulatory Visit | Attending: Internal Medicine | Admitting: Internal Medicine

## 2012-01-30 DIAGNOSIS — R109 Unspecified abdominal pain: Secondary | ICD-10-CM | POA: Diagnosis not present

## 2012-01-31 ENCOUNTER — Encounter (HOSPITAL_COMMUNITY)
Admission: RE | Admit: 2012-01-31 | Discharge: 2012-01-31 | Disposition: A | Discharge status: Home / Self Care | Payer: Medicare Other | Source: Ambulatory Visit | Attending: Cardiology | Admitting: Cardiology

## 2012-01-31 ENCOUNTER — Encounter (HOSPITAL_COMMUNITY): Payer: Medicare Other

## 2012-01-31 DIAGNOSIS — I2510 Atherosclerotic heart disease of native coronary artery without angina pectoris: Secondary | ICD-10-CM | POA: Diagnosis not present

## 2012-01-31 DIAGNOSIS — Z5189 Encounter for other specified aftercare: Secondary | ICD-10-CM | POA: Diagnosis not present

## 2012-01-31 DIAGNOSIS — I2119 ST elevation (STEMI) myocardial infarction involving other coronary artery of inferior wall: Secondary | ICD-10-CM | POA: Diagnosis not present

## 2012-01-31 NOTE — Progress Notes (Signed)
Michele [name] 56 y.o. female Nutrition Note Spoke with pt.  Pt is pre-diabetic. Per discussion with pt, pt using her mother's glucometer to check her CBG's. Pt checking her CBG's at various times (e.g. Fasting, after eating <1 hr prior to checking and > 2 hours post-prandial). Pt unaware of what she was looking for and becoming alarmed by a 30 minute post-prandial CBG of 165 mg/dL. Pt stated she checked her CBG's once because she felt "light headed and dizzy." CBG was 72 mg/dL, which pt was informed was WNL and it may have been time for the pt to eat. Pt ? By this writer if she checked her BP given light headed/dizziness can be associated with BP meds/ hypotension. Pt stated she did not check her BP at that time. This Clinical research associate again reviewed general recommendations for pre-diabetes (diet, exercise, and if appropriate, wt loss). Per discussion with pt, pt agreed to check fasting CBG's 1-2 times a week and keep a log of CBG's to be able to notice trends/changes in blood glucose.  Nutrition Diagnosis   Food-and nutrition-related knowledge deficit related to lack of exposure to information as related to diagnosis of: ? CVD ? Pre-DM (A1c 6.2)    Nutrition RX/ Estimated Daily Nutrition Needs for: wt maintenance 1850-2150 Kcal, 60-70 gm fat, 12-14 gm sat fat, 1.8-2.1 gm trans-fat, <1500 mg sodium   Nutrition Intervention   [name] individual nutrition plan reviewed with pt.   Pt to attend the Portion Distortion class   Pt to attend the  ? Nutrition I class                     ? Nutrition II class   Continue client-centered nutrition education by RD, as part of interdisciplinary care. Goal(s)   Pt to describe the benefit of including fruits, vegetables, whole grains, and low-fat dairy products in a heart healthy meal plan.   Pt able to name foods that affect blood glucose  Monitor and Evaluate progress toward nutrition goal with team.

## 2012-01-31 NOTE — Progress Notes (Signed)
Pt with multiple questions and concern over blood glucose reading on yesterday from her mothers meter. She ws 70 and felt shaky.  Pt ate an apple and rechedked her glucose and she was 165.  Pt was told she is pre-diabetic and she feels she will get diabetes.  Continue emotional support. Will have diabetes educator/dietician talk with her about blood glucose readings and HGA1C.

## 2012-02-03 ENCOUNTER — Encounter (HOSPITAL_COMMUNITY): Payer: Medicare Other

## 2012-02-03 DIAGNOSIS — I2119 ST elevation (STEMI) myocardial infarction involving other coronary artery of inferior wall: Secondary | ICD-10-CM | POA: Insufficient documentation

## 2012-02-03 DIAGNOSIS — I2510 Atherosclerotic heart disease of native coronary artery without angina pectoris: Secondary | ICD-10-CM | POA: Insufficient documentation

## 2012-02-03 DIAGNOSIS — Z5189 Encounter for other specified aftercare: Secondary | ICD-10-CM | POA: Insufficient documentation

## 2012-02-04 ENCOUNTER — Ambulatory Visit: Payer: Medicare Other | Admitting: Internal Medicine

## 2012-02-05 ENCOUNTER — Encounter (HOSPITAL_COMMUNITY): Payer: Medicare Other

## 2012-02-07 ENCOUNTER — Encounter (HOSPITAL_COMMUNITY)
Admission: RE | Admit: 2012-02-07 | Discharge: 2012-02-07 | Disposition: A | Discharge status: Home / Self Care | Payer: Medicare Other | Source: Ambulatory Visit | Attending: Cardiology | Admitting: Cardiology

## 2012-02-07 ENCOUNTER — Encounter (HOSPITAL_COMMUNITY): Payer: Medicare Other

## 2012-02-07 DIAGNOSIS — Z5189 Encounter for other specified aftercare: Secondary | ICD-10-CM | POA: Diagnosis not present

## 2012-02-07 DIAGNOSIS — I2119 ST elevation (STEMI) myocardial infarction involving other coronary artery of inferior wall: Secondary | ICD-10-CM | POA: Diagnosis not present

## 2012-02-07 DIAGNOSIS — I2510 Atherosclerotic heart disease of native coronary artery without angina pectoris: Secondary | ICD-10-CM | POA: Diagnosis not present

## 2012-02-10 ENCOUNTER — Encounter (HOSPITAL_COMMUNITY): Payer: Medicare Other

## 2012-02-10 ENCOUNTER — Encounter (HOSPITAL_COMMUNITY)
Admission: RE | Admit: 2012-02-10 | Discharge: 2012-02-10 | Disposition: A | Discharge status: Home / Self Care | Payer: Medicare Other | Source: Ambulatory Visit | Attending: Cardiology | Admitting: Cardiology

## 2012-02-10 DIAGNOSIS — I2119 ST elevation (STEMI) myocardial infarction involving other coronary artery of inferior wall: Secondary | ICD-10-CM | POA: Diagnosis not present

## 2012-02-10 DIAGNOSIS — Z5189 Encounter for other specified aftercare: Secondary | ICD-10-CM | POA: Diagnosis not present

## 2012-02-10 DIAGNOSIS — I2510 Atherosclerotic heart disease of native coronary artery without angina pectoris: Secondary | ICD-10-CM | POA: Diagnosis not present

## 2012-02-12 ENCOUNTER — Encounter (HOSPITAL_COMMUNITY): Payer: Medicare Other

## 2012-02-12 NOTE — Progress Notes (Signed)
Melisa's crestor was decreased to 5 mg a day.

## 2012-02-13 ENCOUNTER — Other Ambulatory Visit: Payer: Self-pay | Admitting: Internal Medicine

## 2012-02-14 ENCOUNTER — Encounter (HOSPITAL_COMMUNITY): Payer: Medicare Other

## 2012-02-14 ENCOUNTER — Encounter (HOSPITAL_COMMUNITY): Admission: RE | Admit: 2012-02-14 | Payer: Medicare Other | Source: Ambulatory Visit

## 2012-02-14 NOTE — Telephone Encounter (Signed)
Ok to Rf? 

## 2012-02-14 NOTE — Progress Notes (Signed)
Patient came to exercise today complained of having chest pains under her left breast yesterday and complained of feeling "stagger"y yesterday. This morning the patient complained of feeling light headed. Orthostatic blood pressures checked. Lying blood pressure 108/60, sitting 98/60 standing blood pressure 98/60. Michele [name] denied having chest pain today.  Patient given water.  Advised that patient not exercise today. Exercise flow sheets and ECG tracings faxed to Dr Elissa Hefty office for review. Dr Alanda Amass reviewed faxed information and advised that [facility] not exercise today. He encouraged Michele [name] to rest over the weekend and to drink lots of fluids. Patient instructed to call Dr Erich Montane office if she has any problems over the weekend.  Dr Alanda Amass said Michele [name] can return to exercise on Monday. Patient left cardiac rehab today without complaints or symptoms. Exit blood pressure 104/60.

## 2012-02-17 ENCOUNTER — Encounter (HOSPITAL_COMMUNITY)
Admission: RE | Admit: 2012-02-17 | Discharge: 2012-02-17 | Disposition: A | Discharge status: Home / Self Care | Payer: Medicare Other | Source: Ambulatory Visit | Attending: Cardiology | Admitting: Cardiology

## 2012-02-17 ENCOUNTER — Encounter (HOSPITAL_COMMUNITY): Payer: Medicare Other

## 2012-02-17 DIAGNOSIS — I2119 ST elevation (STEMI) myocardial infarction involving other coronary artery of inferior wall: Secondary | ICD-10-CM | POA: Diagnosis not present

## 2012-02-17 DIAGNOSIS — I2510 Atherosclerotic heart disease of native coronary artery without angina pectoris: Secondary | ICD-10-CM | POA: Diagnosis not present

## 2012-02-17 DIAGNOSIS — Z5189 Encounter for other specified aftercare: Secondary | ICD-10-CM | POA: Diagnosis not present

## 2012-02-18 NOTE — Progress Notes (Signed)
Patient blood pressure was noted at 192/78 during bike testing. Michele [name] admitted to pushing herself.  Repeat blood pressure 126/76 .Patient without complaints upon exit from cardiac rehab. .Will fax exercise flow sheets to Dr. Elissa Hefty office for review.

## 2012-02-19 ENCOUNTER — Encounter (HOSPITAL_COMMUNITY)
Admission: RE | Admit: 2012-02-19 | Discharge: 2012-02-19 | Disposition: A | Discharge status: Home / Self Care | Payer: Medicare Other | Source: Ambulatory Visit | Attending: Cardiology | Admitting: Cardiology

## 2012-02-19 ENCOUNTER — Encounter (HOSPITAL_COMMUNITY): Payer: Medicare Other

## 2012-02-19 DIAGNOSIS — I2119 ST elevation (STEMI) myocardial infarction involving other coronary artery of inferior wall: Secondary | ICD-10-CM | POA: Diagnosis not present

## 2012-02-19 DIAGNOSIS — I2510 Atherosclerotic heart disease of native coronary artery without angina pectoris: Secondary | ICD-10-CM | POA: Diagnosis not present

## 2012-02-19 DIAGNOSIS — Z5189 Encounter for other specified aftercare: Secondary | ICD-10-CM | POA: Diagnosis not present

## 2012-02-21 ENCOUNTER — Encounter (HOSPITAL_COMMUNITY)
Admission: RE | Admit: 2012-02-21 | Discharge: 2012-02-21 | Disposition: A | Discharge status: Home / Self Care | Payer: Medicare Other | Source: Ambulatory Visit | Attending: Cardiology | Admitting: Cardiology

## 2012-02-21 ENCOUNTER — Encounter (HOSPITAL_COMMUNITY): Payer: Medicare Other

## 2012-02-21 DIAGNOSIS — Z5189 Encounter for other specified aftercare: Secondary | ICD-10-CM | POA: Diagnosis not present

## 2012-02-21 DIAGNOSIS — I2119 ST elevation (STEMI) myocardial infarction involving other coronary artery of inferior wall: Secondary | ICD-10-CM | POA: Diagnosis not present

## 2012-02-21 DIAGNOSIS — I2510 Atherosclerotic heart disease of native coronary artery without angina pectoris: Secondary | ICD-10-CM | POA: Diagnosis not present

## 2012-02-21 NOTE — Progress Notes (Signed)
[facility] graduates today she plans to continue exercise on her own

## 2012-02-24 ENCOUNTER — Encounter (HOSPITAL_COMMUNITY): Payer: Medicare Other

## 2012-02-24 IMAGING — CR DG CHEST 2V
2 series · 2 of 2 positions shown · non-contrast
Comparison: Two-view chest x-ray 07/19/2009, 04/13/2009, and
04/04/2007.

CLINICAL DATA: Upper left posterior chest pain.  Smoker with
history of COPD.

CHEST - 2 VIEW 10/27/2009:

[w chest pa]
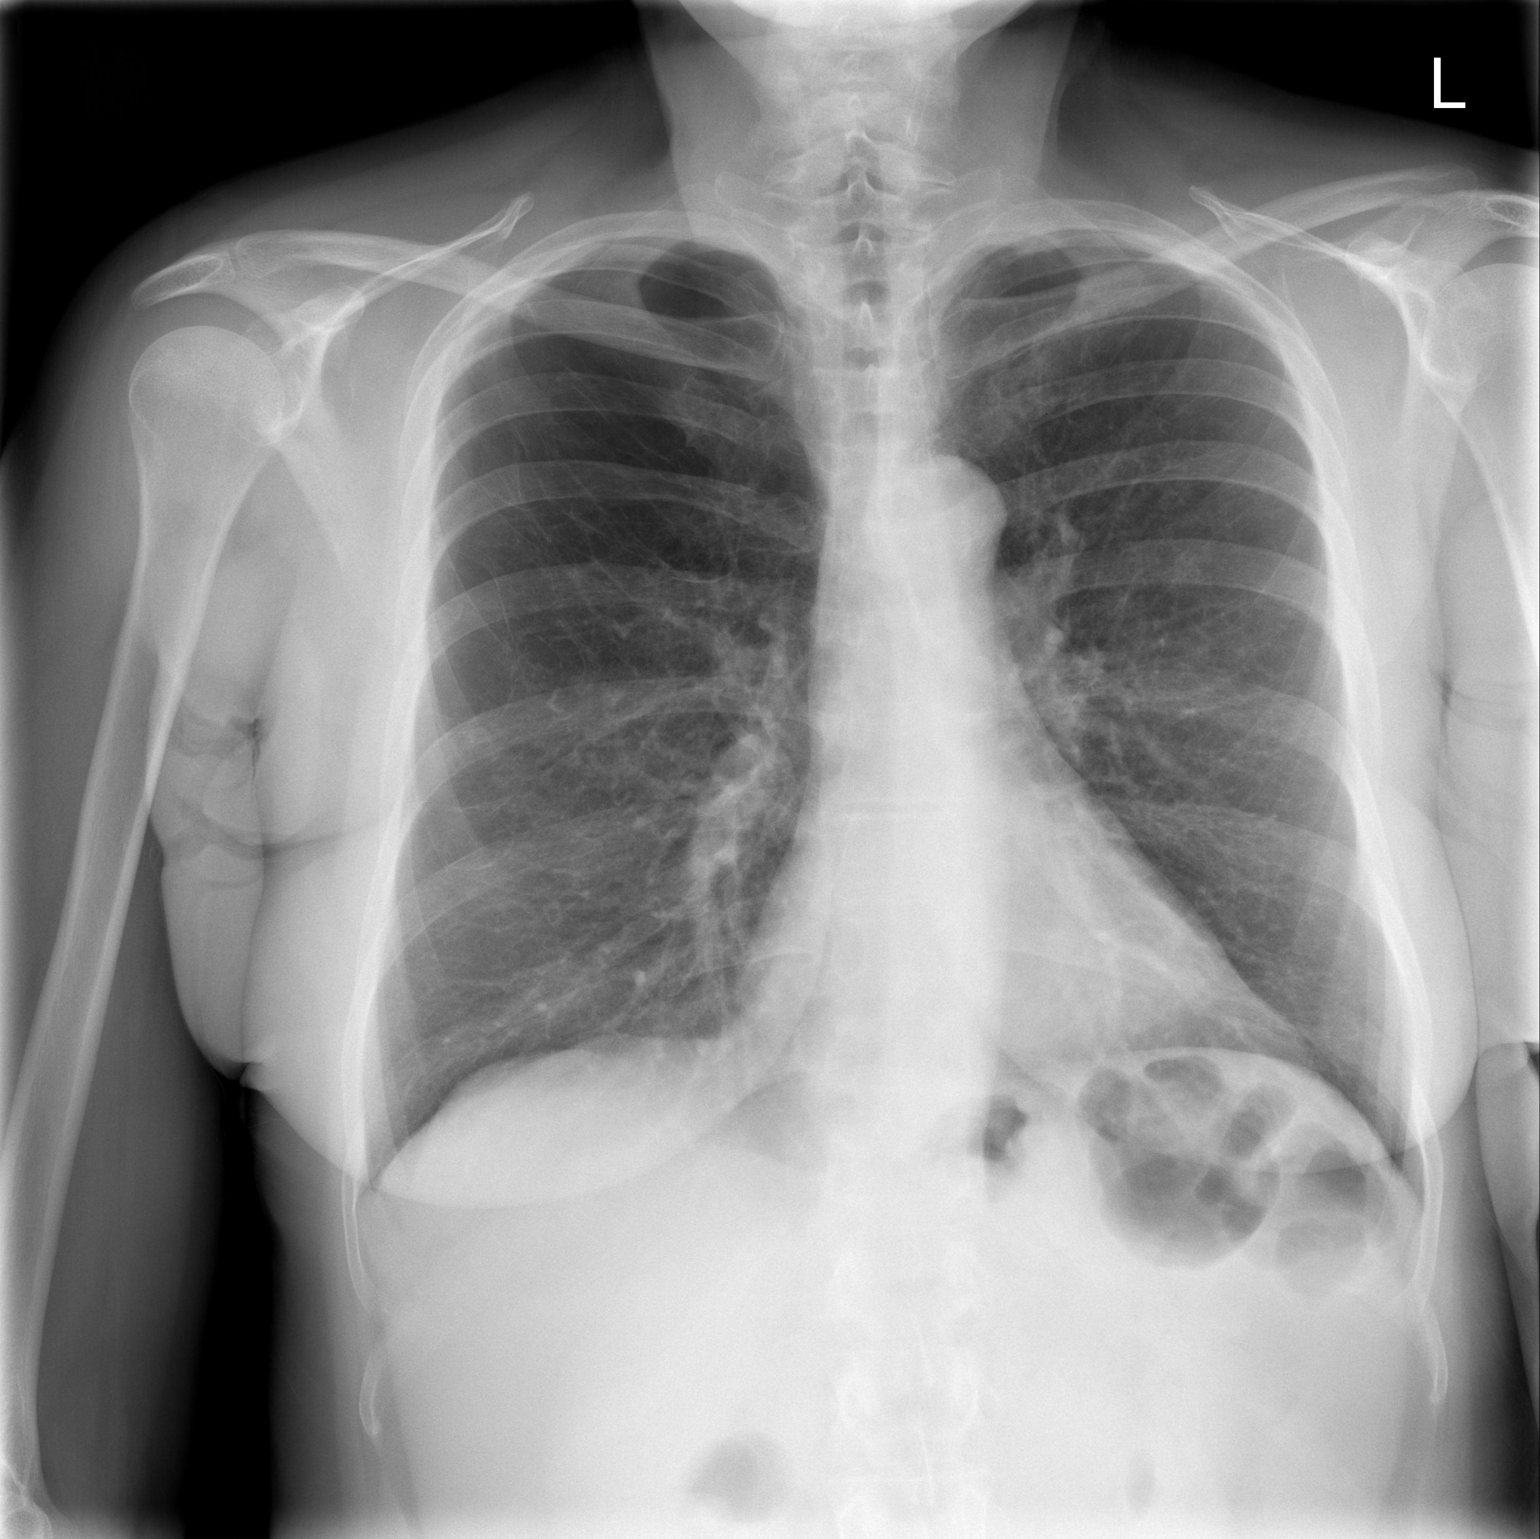

[w chest lat]
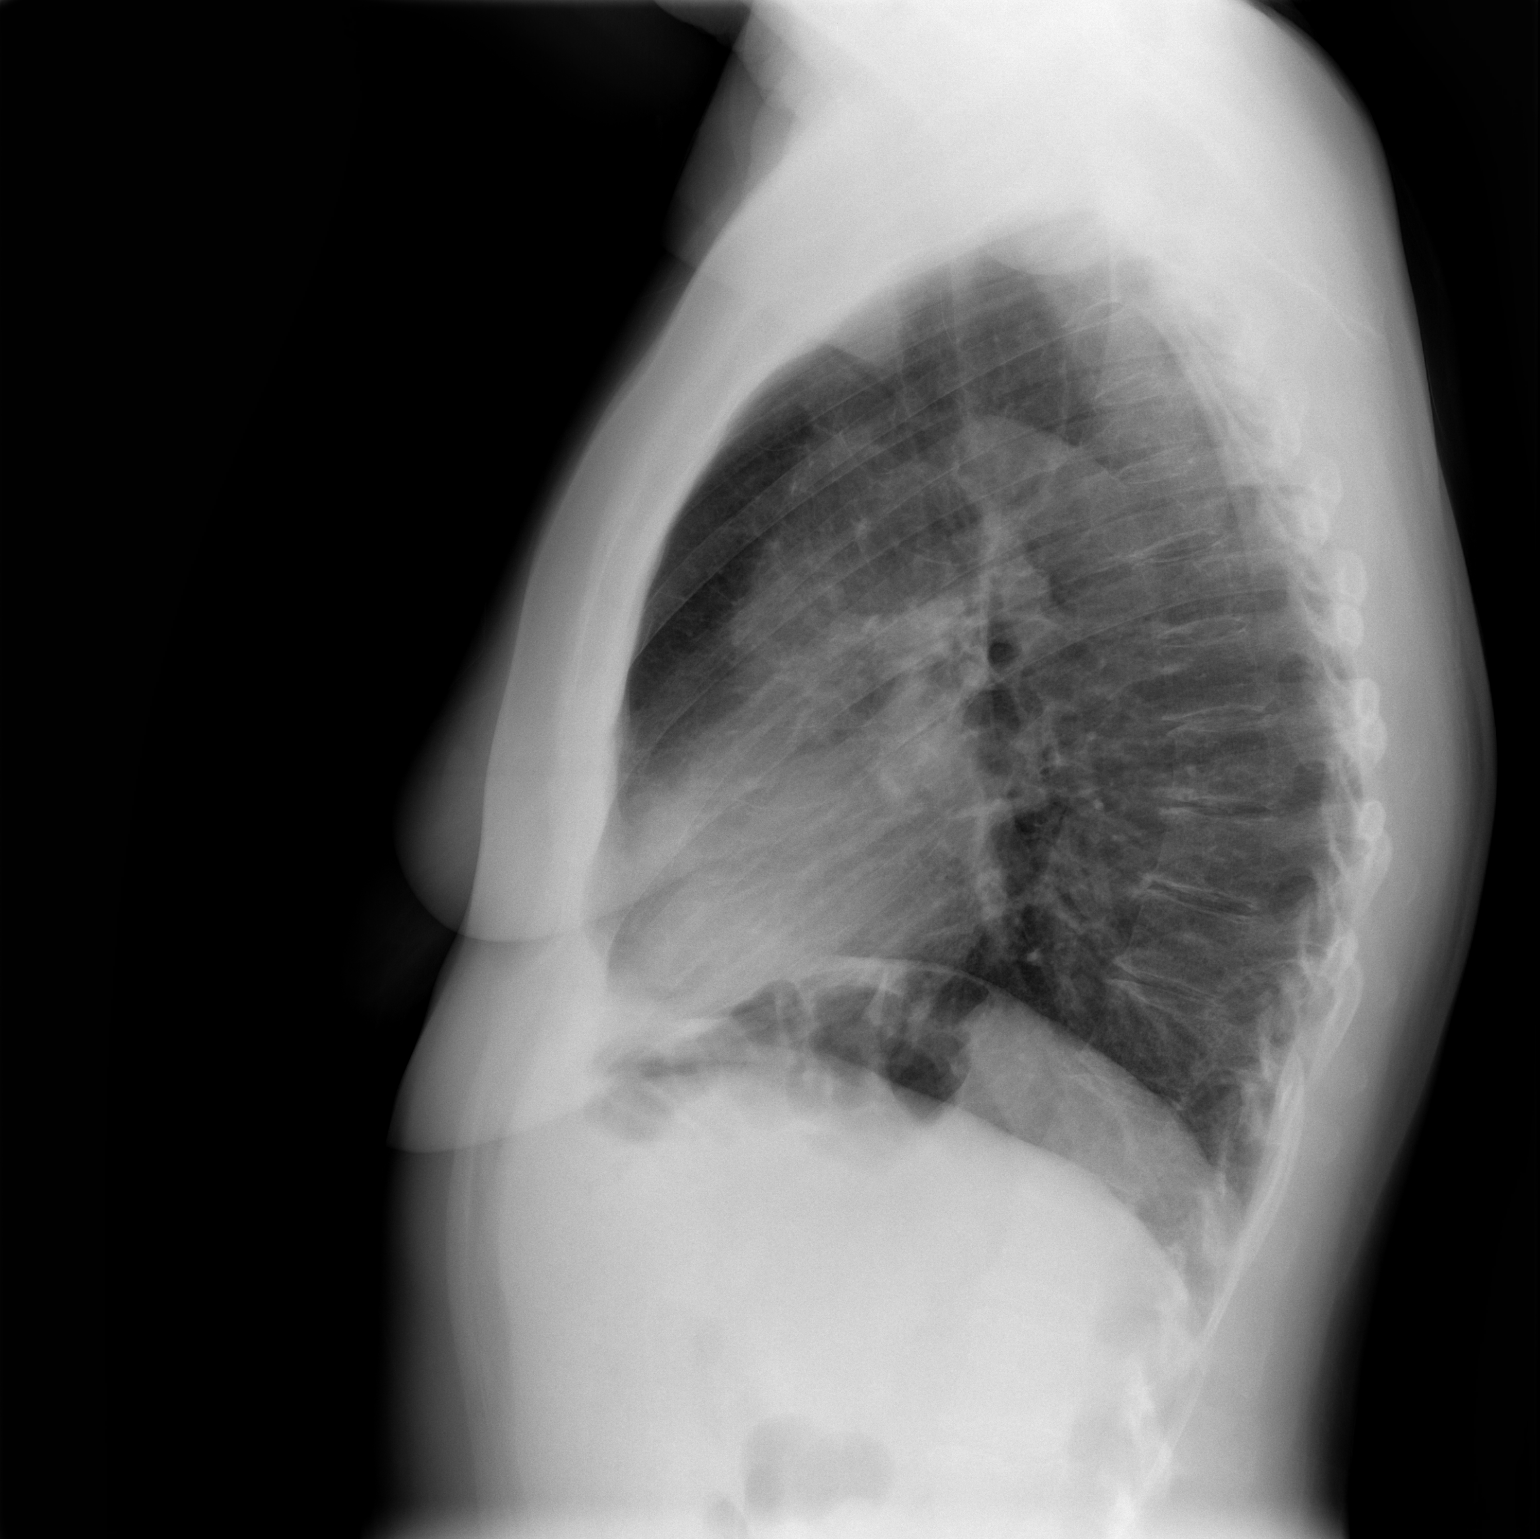

[2 of 2 positions shown; findings below may reference images not displayed]

FINDINGS: Cardiomediastinal silhouette unremarkable and unchanged.
Severe emphysematous changes in both lungs, right greater than
left.  Mild central peribronchial thickening, unchanged.  No new
pulmonary parenchymal abnormalities.  No pleural effusions.  Mild
degenerative changes throughout the thoracic spine.  No significant
interval change.
IMPRESSION: Stable COPD/emphysema.  No acute cardiopulmonary disease.

## 2012-02-26 ENCOUNTER — Encounter (HOSPITAL_COMMUNITY): Payer: Medicare Other

## 2012-02-27 ENCOUNTER — Encounter (HOSPITAL_COMMUNITY): Payer: Self-pay | Admitting: Emergency Medicine

## 2012-02-27 ENCOUNTER — Observation Stay (HOSPITAL_COMMUNITY)
Admission: EM | Admit: 2012-02-27 | Discharge: 2012-02-28 | Disposition: A | Discharge status: Home / Self Care | Payer: Medicare Other | Attending: Internal Medicine | Admitting: Internal Medicine

## 2012-02-27 DIAGNOSIS — R109 Unspecified abdominal pain: Secondary | ICD-10-CM | POA: Diagnosis not present

## 2012-02-27 DIAGNOSIS — F411 Generalized anxiety disorder: Secondary | ICD-10-CM | POA: Diagnosis not present

## 2012-02-27 DIAGNOSIS — I2510 Atherosclerotic heart disease of native coronary artery without angina pectoris: Secondary | ICD-10-CM | POA: Diagnosis not present

## 2012-02-27 DIAGNOSIS — J449 Chronic obstructive pulmonary disease, unspecified: Secondary | ICD-10-CM | POA: Diagnosis present

## 2012-02-27 DIAGNOSIS — K219 Gastro-esophageal reflux disease without esophagitis: Secondary | ICD-10-CM | POA: Diagnosis present

## 2012-02-27 DIAGNOSIS — R0789 Other chest pain: Secondary | ICD-10-CM | POA: Diagnosis not present

## 2012-02-27 DIAGNOSIS — E785 Hyperlipidemia, unspecified: Secondary | ICD-10-CM | POA: Diagnosis present

## 2012-02-27 DIAGNOSIS — I252 Old myocardial infarction: Secondary | ICD-10-CM | POA: Insufficient documentation

## 2012-02-27 DIAGNOSIS — R569 Unspecified convulsions: Secondary | ICD-10-CM | POA: Diagnosis present

## 2012-02-27 DIAGNOSIS — F17200 Nicotine dependence, unspecified, uncomplicated: Secondary | ICD-10-CM | POA: Diagnosis present

## 2012-02-27 DIAGNOSIS — G2581 Restless legs syndrome: Secondary | ICD-10-CM | POA: Diagnosis present

## 2012-02-27 DIAGNOSIS — G40909 Epilepsy, unspecified, not intractable, without status epilepticus: Secondary | ICD-10-CM | POA: Insufficient documentation

## 2012-02-27 DIAGNOSIS — F3289 Other specified depressive episodes: Secondary | ICD-10-CM | POA: Insufficient documentation

## 2012-02-27 DIAGNOSIS — Z9861 Coronary angioplasty status: Secondary | ICD-10-CM | POA: Insufficient documentation

## 2012-02-27 DIAGNOSIS — F419 Anxiety disorder, unspecified: Secondary | ICD-10-CM | POA: Diagnosis present

## 2012-02-27 DIAGNOSIS — F329 Major depressive disorder, single episode, unspecified: Secondary | ICD-10-CM | POA: Insufficient documentation

## 2012-02-27 DIAGNOSIS — I25119 Atherosclerotic heart disease of native coronary artery with unspecified angina pectoris: Secondary | ICD-10-CM | POA: Diagnosis present

## 2012-02-27 DIAGNOSIS — R51 Headache: Secondary | ICD-10-CM | POA: Diagnosis not present

## 2012-02-27 DIAGNOSIS — R079 Chest pain, unspecified: Secondary | ICD-10-CM

## 2012-02-27 DIAGNOSIS — J4489 Other specified chronic obstructive pulmonary disease: Secondary | ICD-10-CM | POA: Insufficient documentation

## 2012-02-27 DIAGNOSIS — M542 Cervicalgia: Secondary | ICD-10-CM | POA: Diagnosis not present

## 2012-02-27 DIAGNOSIS — F32A Depression, unspecified: Secondary | ICD-10-CM | POA: Diagnosis present

## 2012-02-27 DIAGNOSIS — J441 Chronic obstructive pulmonary disease with (acute) exacerbation: Secondary | ICD-10-CM | POA: Diagnosis present

## 2012-02-27 LAB — URINALYSIS, ROUTINE W REFLEX MICROSCOPIC
Bilirubin Urine: NEGATIVE
Glucose, UA: NEGATIVE mg/dL
Hgb urine dipstick: NEGATIVE
Ketones, ur: NEGATIVE mg/dL
Leukocytes, UA: NEGATIVE
Nitrite: NEGATIVE
Protein, ur: NEGATIVE mg/dL
Specific Gravity, Urine: 1.005 (ref 1.005–1.030)
Urobilinogen, UA: 0.2 mg/dL (ref 0.0–1.0)
pH: 6 (ref 5.0–8.0)

## 2012-02-27 LAB — CBC
HCT: 40.5 % (ref 36.0–46.0)
Hemoglobin: 13.8 g/dL (ref 12.0–15.0)
MCH: 29.9 pg (ref 26.0–34.0)
MCHC: 34.1 g/dL (ref 30.0–36.0)
MCV: 87.7 fL (ref 78.0–100.0)
Platelets: 194 10*3/uL (ref 150–400)
RBC: 4.62 MIL/uL (ref 3.87–5.11)
RDW: 12.8 % (ref 11.5–15.5)
WBC: 6.9 10*3/uL (ref 4.0–10.5)

## 2012-02-27 MED ORDER — ONDANSETRON HCL 4 MG/2ML IJ SOLN
4.0000 mg | Freq: Once | INTRAMUSCULAR | Status: AC
  Administered 2012-02-27: 4 mg via INTRAVENOUS
  Filled 2012-02-27: qty 2

## 2012-02-27 MED ORDER — SODIUM CHLORIDE 0.9 % IV BOLUS (SEPSIS)
1000.0000 mL | Freq: Once | INTRAVENOUS | Status: AC
  Administered 2012-02-27: 1000 mL via INTRAVENOUS

## 2012-02-27 MED ORDER — MORPHINE SULFATE 4 MG/ML IJ SOLN
4.0000 mg | Freq: Once | INTRAMUSCULAR | Status: AC
  Administered 2012-02-27: 4 mg via INTRAVENOUS
  Filled 2012-02-27: qty 1
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
## 2012-02-27 NOTE — ED Notes (Signed)
Pt. Arrived by ems. Pt c/o of nausea and weakness that started today. Blood sugars fluctuating between 70's and 170's. EMS bp 122/72. Pt pre diabetic. CBG 80. C/o of pain between shoulder blades, neck, L shoulder and L flank.

## 2012-02-28 ENCOUNTER — Encounter (HOSPITAL_COMMUNITY): Payer: Medicare Other

## 2012-02-28 ENCOUNTER — Encounter (HOSPITAL_COMMUNITY): Payer: Self-pay | Admitting: Internal Medicine

## 2012-02-28 DIAGNOSIS — R079 Chest pain, unspecified: Secondary | ICD-10-CM

## 2012-02-28 LAB — TROPONIN I
Troponin I: <0.3 ng/mL (ref <0.30)
Troponin I: <0.3 ng/mL (ref <0.30)
Troponin I: <0.3 ng/mL (ref <0.30)

## 2012-02-28 LAB — MRSA PCR SCREENING: MRSA by PCR: NEGATIVE

## 2012-02-28 LAB — CREATININE, SERUM
Creatinine, Ser: 0.7 mg/dL (ref 0.50–1.10)
GFR calc Af Amer: >90 mL/min (ref >90)
GFR calc non Af Amer: >90 mL/min (ref >90)

## 2012-02-28 LAB — CBC
HCT: 40.3 % (ref 36.0–46.0)
Hemoglobin: 13.2 g/dL (ref 12.0–15.0)
MCH: 29.3 pg (ref 26.0–34.0)
MCHC: 32.8 g/dL (ref 30.0–36.0)
MCV: 89.6 fL (ref 78.0–100.0)
Platelets: 201 10*3/uL (ref 150–400)
RBC: 4.5 MIL/uL (ref 3.87–5.11)
RDW: 13 % (ref 11.5–15.5)
WBC: 6.4 10*3/uL (ref 4.0–10.5)

## 2012-02-28 LAB — COMPREHENSIVE METABOLIC PANEL
ALT: 13 U/L (ref 0–35)
AST: 15 U/L (ref 0–37)
Albumin: 3.8 g/dL (ref 3.5–5.2)
Alkaline Phosphatase: 74 U/L (ref 39–117)
BUN: 14 mg/dL (ref 6–23)
CO2: 29 mEq/L (ref 19–32)
Calcium: 9.9 mg/dL (ref 8.4–10.5)
Chloride: 102 mEq/L (ref 96–112)
Creatinine, Ser: 0.74 mg/dL (ref 0.50–1.10)
GFR calc Af Amer: >90 mL/min (ref >90)
GFR calc non Af Amer: >90 mL/min (ref >90)
Glucose, Bld: 103 mg/dL — ABNORMAL HIGH (ref 70–99)
Potassium: 3.8 mEq/L (ref 3.5–5.1)
Sodium: 139 mEq/L (ref 135–145)
Total Bilirubin: 0.2 mg/dL — ABNORMAL LOW (ref 0.3–1.2)
Total Protein: 6.5 g/dL (ref 6.0–8.3)

## 2012-02-28 LAB — LIPASE, BLOOD: Lipase: 39 U/L (ref 11–59)

## 2012-02-28 LAB — CK TOTAL AND CKMB (NOT AT ARMC)
CK, MB: 1.5 ng/mL (ref 0.3–4.0)
Relative Index: INVALID (ref 0.0–2.5)
Total CK: 27 U/L (ref 7–177)

## 2012-02-28 LAB — HEMOGLOBIN A1C
Hgb A1c MFr Bld: 5.9 % — ABNORMAL HIGH (ref <5.7)
Mean Plasma Glucose: 123 mg/dL — ABNORMAL HIGH (ref <117)

## 2012-02-28 LAB — POCT I-STAT TROPONIN I: Troponin i, poc: 0 ng/mL (ref 0.00–0.08)

## 2012-02-28 LAB — GLUCOSE, CAPILLARY
Glucose-Capillary: 124 mg/dL — ABNORMAL HIGH (ref 70–99)
Glucose-Capillary: 116 mg/dL — ABNORMAL HIGH (ref 70–99)
Glucose-Capillary: 146 mg/dL — ABNORMAL HIGH (ref 70–99)

## 2012-02-28 MED ORDER — ONDANSETRON HCL 4 MG PO TABS: 4.0000 mg | ORAL_TABLET | Freq: Four times a day (QID) | ORAL | Status: DC | PRN

## 2012-02-28 MED ORDER — ONDANSETRON HCL 4 MG/2ML IJ SOLN: 4.0000 mg | Freq: Three times a day (TID) | INTRAMUSCULAR | Status: DC | PRN

## 2012-02-28 MED ORDER — ACETAMINOPHEN 325 MG PO TABS: 650.0000 mg | ORAL_TABLET | Freq: Four times a day (QID) | ORAL | Status: DC | PRN

## 2012-02-28 MED ORDER — CLONAZEPAM 1 MG PO TABS: 1.0000 mg | ORAL_TABLET | Freq: Three times a day (TID) | ORAL | Status: DC | PRN

## 2012-02-28 MED ORDER — ENOXAPARIN SODIUM 40 MG/0.4ML ~~LOC~~ SOLN
40.0000 mg | SUBCUTANEOUS | Status: DC
  Filled 2012-02-28: qty 0.4

## 2012-02-28 MED ORDER — ONDANSETRON HCL 4 MG/2ML IJ SOLN: 4.0000 mg | Freq: Four times a day (QID) | INTRAMUSCULAR | Status: DC | PRN

## 2012-02-28 MED ORDER — METOPROLOL SUCCINATE 12.5 MG HALF TABLET
12.5000 mg | ORAL_TABLET | Freq: Every day | ORAL | Status: DC
  Filled 2012-02-28: qty 1

## 2012-02-28 MED ORDER — ALBUTEROL SULFATE HFA 108 (90 BASE) MCG/ACT IN AERS: 2.0000 | INHALATION_SPRAY | Freq: Four times a day (QID) | RESPIRATORY_TRACT | Status: DC | PRN

## 2012-02-28 MED ORDER — PRASUGREL HCL 10 MG PO TABS
10.0000 mg | ORAL_TABLET | Freq: Every day | ORAL | Status: DC
  Administered 2012-02-28: 10 mg via ORAL
  Filled 2012-02-28: qty 1

## 2012-02-28 MED ORDER — SODIUM CHLORIDE 0.9 % IJ SOLN: 3.0000 mL | Freq: Two times a day (BID) | INTRAMUSCULAR | Status: DC

## 2012-02-28 MED ORDER — INSULIN ASPART 100 UNIT/ML ~~LOC~~ SOLN: 0.0000 [IU] | Freq: Three times a day (TID) | SUBCUTANEOUS | Status: DC

## 2012-02-28 MED ORDER — ACETAMINOPHEN 650 MG RE SUPP: 650.0000 mg | Freq: Four times a day (QID) | RECTAL | Status: DC | PRN

## 2012-02-28 MED ORDER — HYDROMORPHONE HCL PF 1 MG/ML IJ SOLN: 1.0000 mg | INTRAMUSCULAR | Status: DC | PRN

## 2012-02-28 MED ORDER — PANTOPRAZOLE SODIUM 40 MG PO TBEC
40.0000 mg | DELAYED_RELEASE_TABLET | Freq: Every day | ORAL | Status: DC
  Administered 2012-02-28: 40 mg via ORAL
  Filled 2012-02-28: qty 1

## 2012-02-28 MED ORDER — INFLUENZA VIRUS VACC SPLIT PF IM SUSP: 0.5000 mL | Freq: Once | INTRAMUSCULAR | Status: DC

## 2012-02-28 MED ORDER — ATORVASTATIN CALCIUM 10 MG PO TABS
10.0000 mg | ORAL_TABLET | Freq: Every day | ORAL | Status: DC
  Filled 2012-02-28: qty 1

## 2012-02-28 MED ORDER — SODIUM CHLORIDE 0.9 % IV SOLN
INTRAVENOUS | Status: DC
  Administered 2012-02-28: 06:00:00 via INTRAVENOUS

## 2012-02-28 MED ORDER — TIOTROPIUM BROMIDE MONOHYDRATE 18 MCG IN CAPS
18.0000 ug | ORAL_CAPSULE | Freq: Every day | RESPIRATORY_TRACT | Status: DC
  Administered 2012-02-28: 18 ug via RESPIRATORY_TRACT
  Filled 2012-02-28: qty 5

## 2012-02-28 MED ORDER — ASPIRIN 81 MG PO CHEW
81.0000 mg | CHEWABLE_TABLET | Freq: Every day | ORAL | Status: DC
  Filled 2012-02-28: qty 1

## 2012-02-28 NOTE — ED Notes (Signed)
i-Stat Troponin Results:  cTnl 0.00 ng/mL 

## 2012-02-28 NOTE — Progress Notes (Signed)
Pt D/C to home with daughter. Declined wheelchair assistance out of hospital. PIV d/c'd. All questions answered and D/C instructions reviewed. All personal belongings taken with pt. Pt denies pain or discomfort.
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
## 2012-02-28 NOTE — Discharge Summary (Signed)
Physician Discharge Summary  Michele [name]:[id number] DOB: [birth year] DOA: 02/27/2012  PCP: Rene Paci, MD  Admit date: 02/27/2012 Discharge date: 02/28/2012  Recommendations for Outpatient Follow-up:  1. Stress test to be scheduled by [facility]  Discharge Diagnoses:  Principal Problem:  *CHEST PAIN, ATYPICAL Active Problems:  DYSLIPIDEMIA  ANXIETY  NICOTINE ADDICTION - Tobacco Abuse, long standing  DEPRESSION  RESTLESS LEG SYNDROME  COPD  GERD  SEIZURE DISORDER  ST elevation myocardial infarction (STEMI) of inferior wall -- PCI of mid LCx with 2 overlapping Promus Element DES 2.5 x 12; 2.5 x 8 (post-dilated prox to 2.8 mm)  CAD - Post MI-PCI CFX, staged DES to RCA  11/06/11    Discharge Condition: stable  Diet recommendation: low carb, heart healthy diet  Filed Weights   02/28/12 0515  Weight: 71.5 kg (157 lb 10.1 oz)    History of present illness:  This is a 56 y.o. female with a past medical history significant for CAD and anxiety. She presented to [place] in May 2013 with a STEMI and was taken to the cath lab by Dr [name]. On 10/10/11 she had 2 RCA DES placed. She had residual CAD and on 11/06/11 she had an LAD DES placed. EF was 50-55% by 2D. She has been going to cardiac rehab. She has had problems with her statins causing leg pain (though she had chronic leg pain before she was put on a statin). She has not had a Myoview since her PCI in June. She came to the ER last night with several complaints. She felt "funny" and decided to check her BS with her mother's meter- sugar was 178. She then had a piece of pizza and her BS went to 88 (???). She then noted some crampping abdominal pain under her Lt ribs. This was different from her MI pain. Her Troponins are negative X 2. Her EKG shows no acute changes.    Hospital Course:  Abdominal pain This pain was in her left mid back and radiated across the front and into her epigastrium. It never radiated to  her chest. Non-the- less, due to her h/o CAD and recent stent placement, I requested a Cardiology consult. Her cardiac enzymes are negative and she did not have EKG changes on admission. Dr Jomarie Longs feels that she should have an outpatient stress test and is safe to d/c home today.   Discharge Exam: Filed Vitals:   02/28/12 1000 02/28/12 1100 02/28/12 1200 02/28/12 1300  BP: 98/51 97/57 97/47  91/47  Pulse: 54 65 70 70  Temp:  97.4 F (36.3 C)    TempSrc:  Oral    Resp:  18    Height:      Weight:      SpO2: 95% 94% 96% 92%    General: alert, oriented, no distress, pain resolved.  Cardiovascular: RRR, no murmurs Respiratory: CTA b/l   Discharge Instructions  Discharge Orders    Future Appointments: Provider: Department: Dept Phone: Center:   04/15/2012 3:45 PM Newt Lukes, MD [facility] [phone] [facility]     Future Orders Please Complete By Expires   Diet - low sodium heart healthy      Increase activity slowly      Discharge instructions      Comments:   Follow up with your Cardiologist for a stress tess.       Medication List     As of 02/28/2012  1:40 PM    TAKE these medications  albuterol 108 (90 BASE) MCG/ACT inhaler   Commonly known as: PROVENTIL HFA;VENTOLIN HFA   Inhale 2 puffs into the lungs every 6 (six) hours as needed. For shortness of breath      aspirin 81 MG EC tablet   Take 81 mg by mouth daily. Swallow whole.      CALCIUM 600+D PO   Take 1 tablet by mouth 2 (two) times daily.      clonazePAM 1 MG tablet   Commonly known as: KLONOPIN   Take 1 mg by mouth 3 (three) times daily as needed. For anxiety      CoQ10 100 MG Caps   Take 1 tablet by mouth daily.      metoprolol succinate 25 MG 24 hr tablet   Commonly known as: TOPROL-XL   Take 12.5 mg by mouth at bedtime.      omeprazole 20 MG capsule   Commonly known as: PRILOSEC   Take 1 capsule (20 mg total) by mouth 2 (two) times daily.      prasugrel 10 MG Tabs   Commonly  known as: EFFIENT   Take 1 tablet (10 mg total) by mouth daily.      rosuvastatin 5 MG tablet   Commonly known as: CRESTOR   Take 5 mg by mouth daily.      tiotropium 18 MCG inhalation capsule   Commonly known as: SPIRIVA   Place 1 capsule (18 mcg total) into inhaler and inhale daily.          The results of significant diagnostics from this hospitalization (including imaging, microbiology, ancillary and laboratory) are listed below for reference.    Significant Diagnostic Studies: US Abdomen Complete  01/30/2012  *RADIOLOGY REPORT*  Clinical Data:  Abdominal pain.  COMPLETE ABDOMINAL ULTRASOUND  Comparison:  CT abdomen pelvis 09/28/2010.  Findings:  Gallbladder:  No gallstones, gallbladder wall thickening, or pericholecystic fluid.  Common bile duct:  Measures 5 mm, within normal limits.  Liver:  No focal lesion identified.  Within normal limits in parenchymal echogenicity.  IVC:  Appears normal.  Pancreas:  No focal abnormality seen.  Spleen:  Measures 8.0 cm, negative.  Right Kidney:  Measures 10.2 cm with normal parenchymal echogenicity.  No hydronephrosis.  Left Kidney:  Measures 10.8 cm with normal parenchymal echogenicity.  No hydronephrosis.  Abdominal aorta:  No aneurysm identified.  IMPRESSION: Negative abdominal ultrasound.   Original Report Authenticated By: Reyes Ivan, M.D.     Microbiology: Recent Results (from the past 240 hour(s))  MRSA PCR SCREENING     Status: Normal   Collection Time   02/28/12  5:09 AM      Component Value Range Status Comment   MRSA by PCR NEGATIVE  NEGATIVE Final      Labs: Basic Metabolic Panel:  Lab 02/28/12 4696 02/27/12 2328  NA -- 139  K -- 3.8  CL -- 102  CO2 -- 29  GLUCOSE -- 103*  BUN -- 14  CREATININE 0.70 0.74  CALCIUM -- 9.9  MG -- --  PHOS -- --   Liver Function Tests:  Lab 02/27/12 2328  AST 15  ALT 13  ALKPHOS 74  BILITOT 0.2*  PROT 6.5  ALBUMIN 3.8    Lab 02/27/12 2328  LIPASE 39  AMYLASE --   No  results found for this basename: AMMONIA:5 in the last 168 hours CBC:  Lab 02/28/12 0520 02/27/12 2328  WBC 6.4 6.9  NEUTROABS -- --  HGB 13.2 13.8  HCT  40.3 40.5  MCV 89.6 87.7  PLT 201 194   Cardiac Enzymes:  Lab 02/28/12 1150 02/28/12 0506 02/27/12 2331  CKTOTAL -- -- 27  CKMB -- -- 1.5  CKMBINDEX -- -- --  TROPONINI <0.30 <0.30 <0.30   BNP: BNP (last 3 results)  Basename 10/11/11 0505  PROBNP 925.8*   CBG:  Lab 02/28/12 1149 02/28/12 0735 02/28/12 0544  GLUCAP 146* 116* 124*    Time coordinating discharge: 50 minutes  Signed:  Lot Medford  [facility] 02/28/2012, 1:40 PM

## 2012-02-28 NOTE — ED Notes (Signed)
Pt now c/o of "twitches in breast/chest and pain that shoots through for a second or two".

## 2012-02-28 NOTE — ED Notes (Signed)
Paged [facility] Cardiology to [phone]
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
## 2012-03-02 ENCOUNTER — Encounter (HOSPITAL_COMMUNITY): Payer: Medicare Other

## 2012-03-04 ENCOUNTER — Encounter (HOSPITAL_COMMUNITY): Payer: Medicare Other

## 2012-03-06 ENCOUNTER — Encounter (HOSPITAL_COMMUNITY): Payer: Medicare Other

## 2012-03-06 DIAGNOSIS — Z9861 Coronary angioplasty status: Secondary | ICD-10-CM | POA: Diagnosis not present

## 2012-03-06 DIAGNOSIS — R079 Chest pain, unspecified: Secondary | ICD-10-CM | POA: Diagnosis not present

## 2012-03-06 DIAGNOSIS — I2510 Atherosclerotic heart disease of native coronary artery without angina pectoris: Secondary | ICD-10-CM | POA: Diagnosis not present

## 2012-03-10 ENCOUNTER — Encounter: Payer: Self-pay | Admitting: Internal Medicine

## 2012-03-10 ENCOUNTER — Ambulatory Visit (INDEPENDENT_AMBULATORY_CARE_PROVIDER_SITE_OTHER): Payer: Medicare Other | Admitting: Internal Medicine

## 2012-03-10 VITALS — BP 110/78 | HR 64 | Temp 97.0°F | Ht 65.5 in | Wt 154.1 lb

## 2012-03-10 DIAGNOSIS — IMO0002 Reserved for concepts with insufficient information to code with codable children: Secondary | ICD-10-CM | POA: Diagnosis not present

## 2012-03-10 DIAGNOSIS — S60519A Abrasion of unspecified hand, initial encounter: Secondary | ICD-10-CM

## 2012-03-10 NOTE — Patient Instructions (Addendum)
You had the "chemical cautery" done in the office today to stop the bleeding and dressing applied Please re-dress the wound daily, and use neosporin daily until healed Continue all other medications as before

## 2012-03-10 NOTE — Assessment & Plan Note (Signed)
With minor/mild persistent oozing/bleeding on effient;  tx topically with QR powder to stop oozing and dry dressing applied; advised to keep dry tonight but to start tomorrow with daily neosporin/dressing changes, to watch for any s/s infection, Continue all other medications as before including effient,  to f/u any worsening symptoms or concerns
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
## 2012-03-15 ENCOUNTER — Other Ambulatory Visit: Payer: Self-pay | Admitting: Internal Medicine

## 2012-03-16 NOTE — Telephone Encounter (Signed)
Please advise on refills for this VAL pt, thanks

## 2012-03-16 NOTE — Telephone Encounter (Signed)
Done hardcopy to robin  

## 2012-03-17 ENCOUNTER — Telehealth: Payer: Self-pay

## 2012-03-17 NOTE — Telephone Encounter (Signed)
If no fever, redness or worsening pain , we can see tomorrow

## 2012-03-17 NOTE — Telephone Encounter (Signed)
The patient called to inform her hand is no better. Skin is yellowish and swollen.  She is concerned about an infection.  Advise if OV or something to send in.  calll back number is [phone]

## 2012-03-17 NOTE — Telephone Encounter (Signed)
Called the patient informed of MD instructions she agreed to schedule appt. Tomorrow.

## 2012-03-17 NOTE — Telephone Encounter (Signed)
Faxed hardcopy to pharmacy. 

## 2012-03-20 ENCOUNTER — Other Ambulatory Visit: Payer: Self-pay | Admitting: Internal Medicine

## 2012-03-20 DIAGNOSIS — Z1231 Encounter for screening mammogram for malignant neoplasm of breast: Secondary | ICD-10-CM

## 2012-03-27 DIAGNOSIS — R0602 Shortness of breath: Secondary | ICD-10-CM | POA: Diagnosis not present

## 2012-03-27 DIAGNOSIS — R079 Chest pain, unspecified: Secondary | ICD-10-CM | POA: Diagnosis not present

## 2012-03-27 DIAGNOSIS — I2510 Atherosclerotic heart disease of native coronary artery without angina pectoris: Secondary | ICD-10-CM | POA: Diagnosis not present

## 2012-03-27 DIAGNOSIS — Z9861 Coronary angioplasty status: Secondary | ICD-10-CM | POA: Diagnosis not present

## 2012-03-31 DIAGNOSIS — E782 Mixed hyperlipidemia: Secondary | ICD-10-CM | POA: Diagnosis not present

## 2012-03-31 DIAGNOSIS — I2510 Atherosclerotic heart disease of native coronary artery without angina pectoris: Secondary | ICD-10-CM | POA: Diagnosis not present

## 2012-03-31 DIAGNOSIS — Z9861 Coronary angioplasty status: Secondary | ICD-10-CM | POA: Diagnosis not present

## 2012-04-11 DIAGNOSIS — Z23 Encounter for immunization: Secondary | ICD-10-CM | POA: Diagnosis not present

## 2012-04-15 ENCOUNTER — Ambulatory Visit (INDEPENDENT_AMBULATORY_CARE_PROVIDER_SITE_OTHER): Payer: Medicare Other | Admitting: Internal Medicine

## 2012-04-15 ENCOUNTER — Encounter: Payer: Self-pay | Admitting: Internal Medicine

## 2012-04-15 ENCOUNTER — Other Ambulatory Visit: Payer: Self-pay | Admitting: Internal Medicine

## 2012-04-15 VITALS — BP 124/80 | HR 72 | Temp 97.3°F | Resp 16 | Ht 65.5 in | Wt 151.5 lb

## 2012-04-15 DIAGNOSIS — F411 Generalized anxiety disorder: Secondary | ICD-10-CM

## 2012-04-15 DIAGNOSIS — M62838 Other muscle spasm: Secondary | ICD-10-CM | POA: Diagnosis not present

## 2012-04-15 DIAGNOSIS — R350 Frequency of micturition: Secondary | ICD-10-CM

## 2012-04-15 DIAGNOSIS — R5381 Other malaise: Secondary | ICD-10-CM

## 2012-04-15 DIAGNOSIS — I2510 Atherosclerotic heart disease of native coronary artery without angina pectoris: Secondary | ICD-10-CM | POA: Diagnosis not present

## 2012-04-15 DIAGNOSIS — R5383 Other fatigue: Secondary | ICD-10-CM | POA: Diagnosis not present

## 2012-04-15 LAB — POCT URINALYSIS DIPSTICK
Bilirubin, UA: NEGATIVE
Blood, UA: NEGATIVE
Glucose, UA: NEGATIVE
Ketones, UA: NEGATIVE
Leukocytes, UA: NEGATIVE
Nitrite, UA: NEGATIVE
Protein, UA: NEGATIVE
Spec Grav, UA: 1.015
Urobilinogen, UA: 0.2
pH, UA: 5

## 2012-04-15 MED ORDER — CIPROFLOXACIN HCL 500 MG PO TABS: 500.0000 mg | ORAL_TABLET | Freq: Two times a day (BID) | ORAL | Status: DC

## 2012-04-15 MED ORDER — BACLOFEN 10 MG PO TABS: 10.0000 mg | ORAL_TABLET | Freq: Three times a day (TID) | ORAL | Status: DC

## 2012-04-15 MED ORDER — CLONAZEPAM 1 MG PO TABS: 1.0000 mg | ORAL_TABLET | Freq: Three times a day (TID) | ORAL | Status: DC | PRN

## 2012-04-15 NOTE — Assessment & Plan Note (Signed)
Significant component of anxiety chronic and evident on exam today -  Again discussed med options available to use with scheduled BZ and pt declines same- changed from xanax to klonopin 06/2010 to help her anxiety- refill same today Ok to continue same  Never started prozac as rx'd 10/2010 Reviewed topamax not indicated for bipolar or anxiety - will defer to mental health to consider this or alt tx as needed
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
## 2012-04-15 NOTE — Assessment & Plan Note (Signed)
MI 10/2011 with 2 stents, then 3rd stent 11/2011 to RCA Continue working with cards and current med mgmt as ongoing Advise on need for smoking cessation

## 2012-04-15 NOTE — Patient Instructions (Signed)
It was good to see you today. We have reviewed your prior records including labs and tests today Cipro antibiotics for bladder and kidney infection symptoms, Baclofen for muscle spasm in back and refill clonazepam Your prescription(s) have been submitted to your pharmacy. Please take as directed and contact our office if you believe you are having problem(s) with the medication(s). Other medications reviewed, no other changes at this time. Please schedule followup in 4 months, call sooner if problems.

## 2012-04-16 ENCOUNTER — Telehealth: Payer: Self-pay | Admitting: Internal Medicine

## 2012-04-16 NOTE — Telephone Encounter (Signed)
Labs were done 02/27/12 and 02/28/12 (<2 months ago) and these were normal including no evidence for diabetes mellitus - I do not see reason to repeat at this time. I will be happy to transfer records to another [facility] provider since pt feels uncomfortable with care here -   Please let our manager know same: I will be discharging pt from my practice due to lack of trust in our pt/provider relationship. Thanks  Lab Results  Component Value Date   WBC 6.4 02/28/2012   HGB 13.2 02/28/2012   HCT 40.3 02/28/2012   PLT 201 02/28/2012   GLUCOSE 103* 02/27/2012   CHOL 146 10/11/2011   TRIG 90 10/11/2011   HDL 32* 10/11/2011   LDLDIRECT 92.0 07/03/2011   LDLCALC 96 10/11/2011   ALT 13 02/27/2012   AST 15 02/27/2012   NA 139 02/27/2012   K 3.8 02/27/2012   CL 102 02/27/2012   CREATININE 0.70 02/28/2012   BUN 14 02/27/2012   CO2 29 02/27/2012   TSH 1.896 10/11/2011   INR 1.01 10/10/2011   HGBA1C 5.9* 02/28/2012

## 2012-04-16 NOTE — Telephone Encounter (Signed)
Notified pt with md response. Pt states she is wanting blood work to see what is going on with her. C/o of weakness, ongoing elevated BS. She states she discuss this with md yesterday only a urine was done which results was negative. Pt is not satisfied...Raechel Chute

## 2012-04-16 NOTE — Telephone Encounter (Signed)
[name] concerns noted - we discussed these at her OV 11/13 - no evidence for DM based on these numbers -  I am aware that pt declines additional Cipro - she should call if persisting UTI symptoms off antibiotics

## 2012-04-16 NOTE — Telephone Encounter (Signed)
Patient Information:  Caller Name: Rayza  Phone: [phone]  Patient: Michele [name], Michele [name]  Gender: Female  DOB: 01/08/1956  Age: 56 Years  PCP: Rene Paci (Adults only)   Symptoms  Reason For Call & Symptoms: tremors  Reviewed Health History In EMR: Yes  Reviewed Medications In EMR: Yes  Reviewed Allergies In EMR: Yes  Date of Onset of Symptoms: 04/16/2012  Guideline(s) Used:  Dizziness  Disposition Per Guideline:   Discuss with PCP and Callback by Nurse Today  Reason For Disposition Reached:   Taking a medicine that could cause dizziness (e.g., blood pressure medications, diuretics)  Advice Given:  N/A  Office Follow Up:  Does the office need to follow up with this patient?: Yes  Instructions For The Office: patient callback please; refuses appt to discuss medicaton replacement krs/can  RN Note:  Used her mother's blood sugar monitor after taking first dose of cipro 04/15/12 PM and BS was 79.  On arising AM 04/16/12 BS was 120.  2 hours after 04/16/12 breakfast, BS was 177.  Afebrile.  Advised appt within 24 hours; patient unable to come to office to see Dr. Felicity Coyer 04/16/12; states she is not taking any more cipro but refuses appt to discuss replacement antibiotic.  Info to office for provider review/callback.

## 2012-04-17 ENCOUNTER — Encounter: Payer: Self-pay | Admitting: *Deleted

## 2012-04-17 NOTE — Telephone Encounter (Signed)
signed

## 2012-04-17 NOTE — Telephone Encounter (Signed)
Notified pt with md response. Product/process development scientist (lou) with md response. Printed off dismissal form on md desk for completion...Raechel Chute

## 2012-04-17 NOTE — Telephone Encounter (Signed)
Given to [facility] for dismissal process...Michele [name]

## 2012-04-19 ENCOUNTER — Encounter (HOSPITAL_COMMUNITY): Payer: Self-pay | Admitting: Emergency Medicine

## 2012-04-19 ENCOUNTER — Emergency Department (HOSPITAL_COMMUNITY)
Admission: EM | Admit: 2012-04-19 | Discharge: 2012-04-19 | Disposition: A | Discharge status: Home / Self Care | Payer: Medicare Other | Attending: Emergency Medicine | Admitting: Emergency Medicine

## 2012-04-19 DIAGNOSIS — Z862 Personal history of diseases of the blood and blood-forming organs and certain disorders involving the immune mechanism: Secondary | ICD-10-CM | POA: Diagnosis not present

## 2012-04-19 DIAGNOSIS — N949 Unspecified condition associated with female genital organs and menstrual cycle: Secondary | ICD-10-CM | POA: Insufficient documentation

## 2012-04-19 DIAGNOSIS — I2510 Atherosclerotic heart disease of native coronary artery without angina pectoris: Secondary | ICD-10-CM | POA: Insufficient documentation

## 2012-04-19 DIAGNOSIS — K219 Gastro-esophageal reflux disease without esophagitis: Secondary | ICD-10-CM | POA: Insufficient documentation

## 2012-04-19 DIAGNOSIS — M545 Low back pain, unspecified: Secondary | ICD-10-CM | POA: Diagnosis not present

## 2012-04-19 DIAGNOSIS — J449 Chronic obstructive pulmonary disease, unspecified: Secondary | ICD-10-CM | POA: Diagnosis not present

## 2012-04-19 DIAGNOSIS — Z872 Personal history of diseases of the skin and subcutaneous tissue: Secondary | ICD-10-CM | POA: Insufficient documentation

## 2012-04-19 DIAGNOSIS — F3289 Other specified depressive episodes: Secondary | ICD-10-CM | POA: Insufficient documentation

## 2012-04-19 DIAGNOSIS — E785 Hyperlipidemia, unspecified: Secondary | ICD-10-CM | POA: Diagnosis not present

## 2012-04-19 DIAGNOSIS — M25569 Pain in unspecified knee: Secondary | ICD-10-CM | POA: Insufficient documentation

## 2012-04-19 DIAGNOSIS — A499 Bacterial infection, unspecified: Secondary | ICD-10-CM | POA: Insufficient documentation

## 2012-04-19 DIAGNOSIS — G2581 Restless legs syndrome: Secondary | ICD-10-CM | POA: Insufficient documentation

## 2012-04-19 DIAGNOSIS — N760 Acute vaginitis: Secondary | ICD-10-CM | POA: Diagnosis not present

## 2012-04-19 DIAGNOSIS — G8929 Other chronic pain: Secondary | ICD-10-CM | POA: Insufficient documentation

## 2012-04-19 DIAGNOSIS — Z9851 Tubal ligation status: Secondary | ICD-10-CM | POA: Insufficient documentation

## 2012-04-19 DIAGNOSIS — Z79899 Other long term (current) drug therapy: Secondary | ICD-10-CM | POA: Insufficient documentation

## 2012-04-19 DIAGNOSIS — Z7982 Long term (current) use of aspirin: Secondary | ICD-10-CM | POA: Diagnosis not present

## 2012-04-19 DIAGNOSIS — F329 Major depressive disorder, single episode, unspecified: Secondary | ICD-10-CM | POA: Insufficient documentation

## 2012-04-19 DIAGNOSIS — Z8719 Personal history of other diseases of the digestive system: Secondary | ICD-10-CM | POA: Insufficient documentation

## 2012-04-19 DIAGNOSIS — F17200 Nicotine dependence, unspecified, uncomplicated: Secondary | ICD-10-CM | POA: Insufficient documentation

## 2012-04-19 DIAGNOSIS — Z8739 Personal history of other diseases of the musculoskeletal system and connective tissue: Secondary | ICD-10-CM | POA: Diagnosis not present

## 2012-04-19 DIAGNOSIS — F411 Generalized anxiety disorder: Secondary | ICD-10-CM | POA: Insufficient documentation

## 2012-04-19 DIAGNOSIS — R102 Pelvic and perineal pain unspecified side: Secondary | ICD-10-CM

## 2012-04-19 DIAGNOSIS — B9689 Other specified bacterial agents as the cause of diseases classified elsewhere: Secondary | ICD-10-CM | POA: Insufficient documentation

## 2012-04-19 DIAGNOSIS — Z9861 Coronary angioplasty status: Secondary | ICD-10-CM | POA: Diagnosis not present

## 2012-04-19 DIAGNOSIS — J4489 Other specified chronic obstructive pulmonary disease: Secondary | ICD-10-CM | POA: Insufficient documentation

## 2012-04-19 LAB — CBC WITH DIFFERENTIAL/PLATELET
Basophils Absolute: 0 10*3/uL (ref 0.0–0.1)
Basophils Relative: 0 % (ref 0–1)
Eosinophils Absolute: 0.1 10*3/uL (ref 0.0–0.7)
Eosinophils Relative: 1 % (ref 0–5)
HCT: 44.1 % (ref 36.0–46.0)
Hemoglobin: 15.1 g/dL — ABNORMAL HIGH (ref 12.0–15.0)
Lymphocytes Relative: 20 % (ref 12–46)
Lymphs Abs: 1.1 10*3/uL (ref 0.7–4.0)
MCH: 29.7 pg (ref 26.0–34.0)
MCHC: 34.2 g/dL (ref 30.0–36.0)
MCV: 86.8 fL (ref 78.0–100.0)
Monocytes Absolute: 0.3 10*3/uL (ref 0.1–1.0)
Monocytes Relative: 5 % (ref 3–12)
Neutro Abs: 4.2 10*3/uL (ref 1.7–7.7)
Neutrophils Relative %: 74 % (ref 43–77)
Platelets: 155 10*3/uL (ref 150–400)
RBC: 5.08 MIL/uL (ref 3.87–5.11)
RDW: 12.9 % (ref 11.5–15.5)
WBC: 5.6 10*3/uL (ref 4.0–10.5)

## 2012-04-19 LAB — COMPREHENSIVE METABOLIC PANEL
ALT: 11 U/L (ref 0–35)
AST: 16 U/L (ref 0–37)
Albumin: 3.9 g/dL (ref 3.5–5.2)
Alkaline Phosphatase: 76 U/L (ref 39–117)
BUN: 14 mg/dL (ref 6–23)
CO2: 26 mEq/L (ref 19–32)
Calcium: 9.9 mg/dL (ref 8.4–10.5)
Chloride: 106 mEq/L (ref 96–112)
Creatinine, Ser: 0.57 mg/dL (ref 0.50–1.10)
GFR calc Af Amer: >90 mL/min (ref >90)
GFR calc non Af Amer: >90 mL/min (ref >90)
Glucose, Bld: 111 mg/dL — ABNORMAL HIGH (ref 70–99)
Potassium: 4.3 mEq/L (ref 3.5–5.1)
Sodium: 141 mEq/L (ref 135–145)
Total Bilirubin: 0.2 mg/dL — ABNORMAL LOW (ref 0.3–1.2)
Total Protein: 7 g/dL (ref 6.0–8.3)

## 2012-04-19 LAB — URINALYSIS, ROUTINE W REFLEX MICROSCOPIC
Bilirubin Urine: NEGATIVE
Glucose, UA: NEGATIVE mg/dL
Hgb urine dipstick: NEGATIVE
Ketones, ur: NEGATIVE mg/dL
Leukocytes, UA: NEGATIVE
Nitrite: NEGATIVE
Protein, ur: NEGATIVE mg/dL
Specific Gravity, Urine: 1.006 (ref 1.005–1.030)
Urobilinogen, UA: 0.2 mg/dL (ref 0.0–1.0)
pH: 7 (ref 5.0–8.0)

## 2012-04-19 LAB — WET PREP, GENITAL
Trich, Wet Prep: NONE SEEN
Yeast Wet Prep HPF POC: NONE SEEN

## 2012-04-19 LAB — CK: Total CK: 21 U/L (ref 7–177)

## 2012-04-19 MED ORDER — PHENAZOPYRIDINE HCL 200 MG PO TABS: 200.0000 mg | ORAL_TABLET | Freq: Three times a day (TID) | ORAL | Status: DC

## 2012-04-19 MED ORDER — TRAMADOL HCL 50 MG PO TABS: 100.0000 mg | ORAL_TABLET | Freq: Four times a day (QID) | ORAL | Status: DC | PRN

## 2012-04-19 MED ORDER — METHOCARBAMOL 500 MG PO TABS: ORAL_TABLET | ORAL | Status: DC

## 2012-04-19 MED ORDER — METRONIDAZOLE 500 MG PO TABS: 500.0000 mg | ORAL_TABLET | Freq: Two times a day (BID) | ORAL | Status: DC
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
## 2012-04-19 NOTE — ED Notes (Addendum)
Pt went to PCP for dysuria and low back pain on 11/13, given Cipro for possible UTI and Baclofen for back pain. Pt states she took one Cipro and began shaking and felt like her blood sugar dropped, never took Baclofen. Pt states she called her PCP back and was told they wouldn't give her anything else because her urine dipstick was negative.

## 2012-04-20 ENCOUNTER — Telehealth: Payer: Self-pay | Admitting: Internal Medicine

## 2012-04-20 NOTE — Telephone Encounter (Signed)
Dismissal Letter sent by Certified Mail 04/20/2012  Received the Return Receipt showing someone picked up the Dismissal Letter 04/22/2012

## 2012-04-23 ENCOUNTER — Ambulatory Visit
Admission: RE | Admit: 2012-04-23 | Discharge: 2012-04-23 | Disposition: A | Discharge status: Home / Self Care | Payer: Medicare Other | Source: Ambulatory Visit | Attending: Internal Medicine | Admitting: Internal Medicine

## 2012-04-23 DIAGNOSIS — Z1231 Encounter for screening mammogram for malignant neoplasm of breast: Secondary | ICD-10-CM | POA: Diagnosis not present

## 2012-05-20 DIAGNOSIS — K219 Gastro-esophageal reflux disease without esophagitis: Secondary | ICD-10-CM | POA: Diagnosis not present

## 2012-05-20 DIAGNOSIS — R7309 Other abnormal glucose: Secondary | ICD-10-CM | POA: Diagnosis not present

## 2012-05-20 DIAGNOSIS — F411 Generalized anxiety disorder: Secondary | ICD-10-CM | POA: Diagnosis not present

## 2012-05-20 DIAGNOSIS — G479 Sleep disorder, unspecified: Secondary | ICD-10-CM | POA: Diagnosis not present

## 2012-05-20 DIAGNOSIS — J449 Chronic obstructive pulmonary disease, unspecified: Secondary | ICD-10-CM | POA: Diagnosis not present

## 2012-05-20 DIAGNOSIS — I2510 Atherosclerotic heart disease of native coronary artery without angina pectoris: Secondary | ICD-10-CM | POA: Diagnosis not present

## 2012-05-20 DIAGNOSIS — M549 Dorsalgia, unspecified: Secondary | ICD-10-CM | POA: Diagnosis not present

## 2012-05-22 DIAGNOSIS — F17200 Nicotine dependence, unspecified, uncomplicated: Secondary | ICD-10-CM | POA: Diagnosis not present

## 2012-05-22 DIAGNOSIS — E782 Mixed hyperlipidemia: Secondary | ICD-10-CM | POA: Diagnosis not present

## 2012-05-22 DIAGNOSIS — I2510 Atherosclerotic heart disease of native coronary artery without angina pectoris: Secondary | ICD-10-CM | POA: Diagnosis not present

## 2012-05-22 DIAGNOSIS — R5381 Other malaise: Secondary | ICD-10-CM | POA: Diagnosis not present

## 2012-05-22 DIAGNOSIS — R0602 Shortness of breath: Secondary | ICD-10-CM | POA: Diagnosis not present

## 2012-05-22 DIAGNOSIS — R5383 Other fatigue: Secondary | ICD-10-CM | POA: Diagnosis not present

## 2012-06-11 DIAGNOSIS — E782 Mixed hyperlipidemia: Secondary | ICD-10-CM | POA: Diagnosis not present

## 2012-06-11 DIAGNOSIS — R0602 Shortness of breath: Secondary | ICD-10-CM | POA: Diagnosis not present

## 2012-06-11 DIAGNOSIS — I2510 Atherosclerotic heart disease of native coronary artery without angina pectoris: Secondary | ICD-10-CM | POA: Diagnosis not present

## 2012-06-11 DIAGNOSIS — J449 Chronic obstructive pulmonary disease, unspecified: Secondary | ICD-10-CM | POA: Diagnosis not present

## 2012-07-13 DIAGNOSIS — N898 Other specified noninflammatory disorders of vagina: Secondary | ICD-10-CM | POA: Diagnosis not present

## 2012-07-13 DIAGNOSIS — R51 Headache: Secondary | ICD-10-CM | POA: Diagnosis not present

## 2012-07-13 DIAGNOSIS — R319 Hematuria, unspecified: Secondary | ICD-10-CM | POA: Diagnosis not present

## 2012-07-13 DIAGNOSIS — M549 Dorsalgia, unspecified: Secondary | ICD-10-CM | POA: Diagnosis not present

## 2012-07-13 DIAGNOSIS — N760 Acute vaginitis: Secondary | ICD-10-CM | POA: Diagnosis not present

## 2012-07-13 DIAGNOSIS — R7309 Other abnormal glucose: Secondary | ICD-10-CM | POA: Diagnosis not present

## 2012-07-13 DIAGNOSIS — L293 Anogenital pruritus, unspecified: Secondary | ICD-10-CM | POA: Diagnosis not present

## 2012-08-12 ENCOUNTER — Ambulatory Visit: Payer: Medicare Other | Admitting: Internal Medicine

## 2012-08-18 DIAGNOSIS — J441 Chronic obstructive pulmonary disease with (acute) exacerbation: Secondary | ICD-10-CM | POA: Diagnosis not present

## 2012-08-18 DIAGNOSIS — R109 Unspecified abdominal pain: Secondary | ICD-10-CM | POA: Diagnosis not present

## 2012-08-29 ENCOUNTER — Emergency Department (HOSPITAL_COMMUNITY): Payer: Medicare Other

## 2012-08-29 ENCOUNTER — Encounter (HOSPITAL_COMMUNITY): Payer: Self-pay | Admitting: *Deleted

## 2012-08-29 ENCOUNTER — Emergency Department (HOSPITAL_COMMUNITY)
Admission: EM | Admit: 2012-08-29 | Discharge: 2012-08-29 | Disposition: A | Discharge status: Home / Self Care | Payer: Medicare Other | Attending: Emergency Medicine | Admitting: Emergency Medicine

## 2012-08-29 DIAGNOSIS — Z7982 Long term (current) use of aspirin: Secondary | ICD-10-CM | POA: Insufficient documentation

## 2012-08-29 DIAGNOSIS — E559 Vitamin D deficiency, unspecified: Secondary | ICD-10-CM | POA: Diagnosis not present

## 2012-08-29 DIAGNOSIS — F329 Major depressive disorder, single episode, unspecified: Secondary | ICD-10-CM | POA: Diagnosis not present

## 2012-08-29 DIAGNOSIS — Z872 Personal history of diseases of the skin and subcutaneous tissue: Secondary | ICD-10-CM | POA: Insufficient documentation

## 2012-08-29 DIAGNOSIS — F3289 Other specified depressive episodes: Secondary | ICD-10-CM | POA: Insufficient documentation

## 2012-08-29 DIAGNOSIS — I252 Old myocardial infarction: Secondary | ICD-10-CM | POA: Insufficient documentation

## 2012-08-29 DIAGNOSIS — R51 Headache: Secondary | ICD-10-CM | POA: Diagnosis not present

## 2012-08-29 DIAGNOSIS — Z8669 Personal history of other diseases of the nervous system and sense organs: Secondary | ICD-10-CM | POA: Insufficient documentation

## 2012-08-29 DIAGNOSIS — M79609 Pain in unspecified limb: Secondary | ICD-10-CM | POA: Diagnosis not present

## 2012-08-29 DIAGNOSIS — R209 Unspecified disturbances of skin sensation: Secondary | ICD-10-CM | POA: Diagnosis not present

## 2012-08-29 DIAGNOSIS — I2510 Atherosclerotic heart disease of native coronary artery without angina pectoris: Secondary | ICD-10-CM | POA: Insufficient documentation

## 2012-08-29 DIAGNOSIS — R200 Anesthesia of skin: Secondary | ICD-10-CM

## 2012-08-29 DIAGNOSIS — Z8739 Personal history of other diseases of the musculoskeletal system and connective tissue: Secondary | ICD-10-CM | POA: Diagnosis not present

## 2012-08-29 DIAGNOSIS — Z8719 Personal history of other diseases of the digestive system: Secondary | ICD-10-CM | POA: Insufficient documentation

## 2012-08-29 DIAGNOSIS — E785 Hyperlipidemia, unspecified: Secondary | ICD-10-CM | POA: Diagnosis not present

## 2012-08-29 DIAGNOSIS — J449 Chronic obstructive pulmonary disease, unspecified: Secondary | ICD-10-CM | POA: Insufficient documentation

## 2012-08-29 DIAGNOSIS — Z9861 Coronary angioplasty status: Secondary | ICD-10-CM | POA: Diagnosis not present

## 2012-08-29 DIAGNOSIS — F411 Generalized anxiety disorder: Secondary | ICD-10-CM | POA: Insufficient documentation

## 2012-08-29 DIAGNOSIS — G8929 Other chronic pain: Secondary | ICD-10-CM | POA: Diagnosis not present

## 2012-08-29 DIAGNOSIS — K219 Gastro-esophageal reflux disease without esophagitis: Secondary | ICD-10-CM | POA: Diagnosis not present

## 2012-08-29 DIAGNOSIS — J4489 Other specified chronic obstructive pulmonary disease: Secondary | ICD-10-CM | POA: Insufficient documentation

## 2012-08-29 DIAGNOSIS — F17200 Nicotine dependence, unspecified, uncomplicated: Secondary | ICD-10-CM | POA: Insufficient documentation

## 2012-08-29 DIAGNOSIS — Z79899 Other long term (current) drug therapy: Secondary | ICD-10-CM | POA: Diagnosis not present

## 2012-08-29 DIAGNOSIS — Z0389 Encounter for observation for other suspected diseases and conditions ruled out: Secondary | ICD-10-CM | POA: Diagnosis not present

## 2012-08-29 DIAGNOSIS — Z87828 Personal history of other (healed) physical injury and trauma: Secondary | ICD-10-CM | POA: Diagnosis not present

## 2012-08-29 LAB — POCT I-STAT TROPONIN I: Troponin i, poc: 0 ng/mL (ref 0.00–0.08)

## 2012-08-29 LAB — CBC
HCT: 42.6 % (ref 36.0–46.0)
Hemoglobin: 14.6 g/dL (ref 12.0–15.0)
MCH: 29.3 pg (ref 26.0–34.0)
MCHC: 34.3 g/dL (ref 30.0–36.0)
MCV: 85.5 fL (ref 78.0–100.0)
Platelets: 173 10*3/uL (ref 150–400)
RBC: 4.98 MIL/uL (ref 3.87–5.11)
RDW: 13.4 % (ref 11.5–15.5)
WBC: 6.2 10*3/uL (ref 4.0–10.5)

## 2012-08-29 LAB — BASIC METABOLIC PANEL
BUN: 16 mg/dL (ref 6–23)
CO2: 27 mEq/L (ref 19–32)
Calcium: 9.9 mg/dL (ref 8.4–10.5)
Chloride: 104 mEq/L (ref 96–112)
Creatinine, Ser: 0.59 mg/dL (ref 0.50–1.10)
GFR calc Af Amer: >90 mL/min (ref >90)
GFR calc non Af Amer: >90 mL/min (ref >90)
Glucose, Bld: 94 mg/dL (ref 70–99)
Potassium: 5.2 mEq/L — ABNORMAL HIGH (ref 3.5–5.1)
Sodium: 140 mEq/L (ref 135–145)

## 2012-08-29 MED ORDER — MORPHINE SULFATE 4 MG/ML IJ SOLN
4.0000 mg | Freq: Once | INTRAMUSCULAR | Status: AC
  Administered 2012-08-29: 4 mg via INTRAVENOUS
  Filled 2012-08-29: qty 1

## 2012-08-29 NOTE — ED Notes (Signed)
MRI was not done at this time due to another pt currently having one. Another transporter will come back soon to take pt to have MRI completed.

## 2012-08-29 NOTE — ED Notes (Signed)
Pt drinking water and eating graham crackers with no distress.

## 2012-08-29 NOTE — ED Notes (Signed)
Reports having numbness to her lips that started last night, has gotten worse this am and also having pain to left arm and shoulder since this morning. Hx of MI. No acute distress noted at triage, ekg being done.

## 2012-08-29 NOTE — ED Notes (Signed)
Dr. Campos at bedside   

## 2012-08-29 NOTE — ED Notes (Signed)
Pt is now complaining of a HA. EDP notified.
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
## 2012-08-29 NOTE — ED Notes (Signed)
Pt requests CT scan to rule out possible stroke or TIA. EDP notified.

## 2012-08-29 NOTE — ED Notes (Addendum)
Pt is now reporting tingling going to left leg  . EDP notified.

## 2012-09-07 DIAGNOSIS — R0602 Shortness of breath: Secondary | ICD-10-CM | POA: Diagnosis not present

## 2012-09-07 HISTORY — PX: OTHER SURGICAL HISTORY: SHX169

## 2012-09-09 ENCOUNTER — Other Ambulatory Visit: Payer: Self-pay | Admitting: Internal Medicine

## 2012-09-24 DIAGNOSIS — R5383 Other fatigue: Secondary | ICD-10-CM | POA: Diagnosis not present

## 2012-09-24 DIAGNOSIS — E782 Mixed hyperlipidemia: Secondary | ICD-10-CM | POA: Diagnosis not present

## 2012-09-24 DIAGNOSIS — E8881 Metabolic syndrome: Secondary | ICD-10-CM | POA: Diagnosis not present

## 2012-09-24 DIAGNOSIS — R5381 Other malaise: Secondary | ICD-10-CM | POA: Diagnosis not present

## 2012-09-24 DIAGNOSIS — Z79899 Other long term (current) drug therapy: Secondary | ICD-10-CM | POA: Diagnosis not present

## 2012-09-29 ENCOUNTER — Other Ambulatory Visit: Payer: Self-pay | Admitting: Family Medicine

## 2012-09-29 DIAGNOSIS — R5383 Other fatigue: Secondary | ICD-10-CM | POA: Diagnosis not present

## 2012-09-29 DIAGNOSIS — R229 Localized swelling, mass and lump, unspecified: Secondary | ICD-10-CM | POA: Diagnosis not present

## 2012-09-29 DIAGNOSIS — I2510 Atherosclerotic heart disease of native coronary artery without angina pectoris: Secondary | ICD-10-CM | POA: Diagnosis not present

## 2012-09-29 DIAGNOSIS — R5381 Other malaise: Secondary | ICD-10-CM | POA: Diagnosis not present

## 2012-10-01 ENCOUNTER — Ambulatory Visit
Admission: RE | Admit: 2012-10-01 | Discharge: 2012-10-01 | Disposition: A | Discharge status: Home / Self Care | Payer: Medicare Other | Source: Ambulatory Visit | Attending: Family Medicine | Admitting: Family Medicine

## 2012-10-01 DIAGNOSIS — R229 Localized swelling, mass and lump, unspecified: Secondary | ICD-10-CM | POA: Diagnosis not present

## 2012-10-13 IMAGING — CR DG CHEST 2V
2 series · 2 of 2 positions shown · non-contrast
Comparison: 10/27/2009

CLINICAL DATA: Chest pain, weakness and left arm numbness

CHEST - 2 VIEW

[w chest pa]
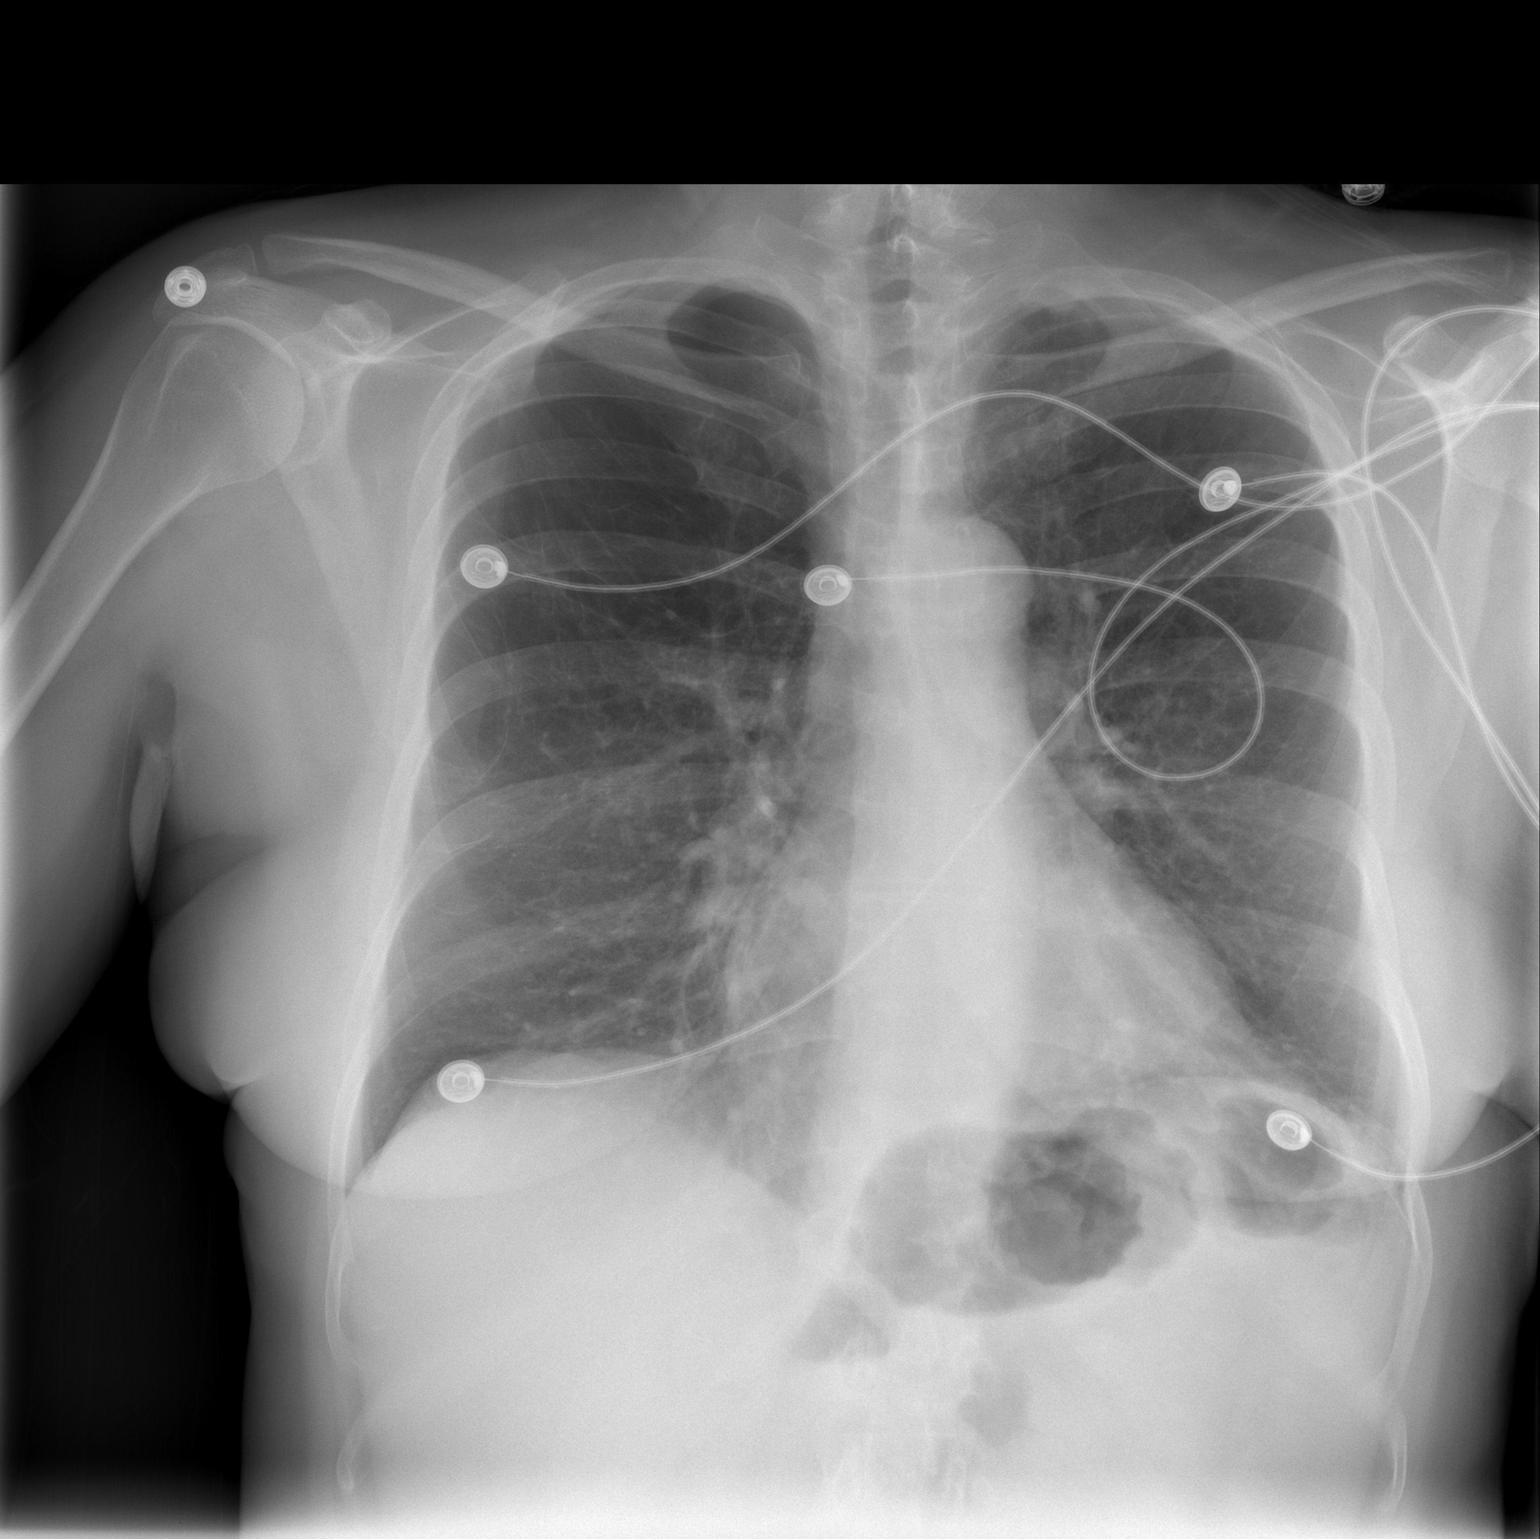

[w chest lat]
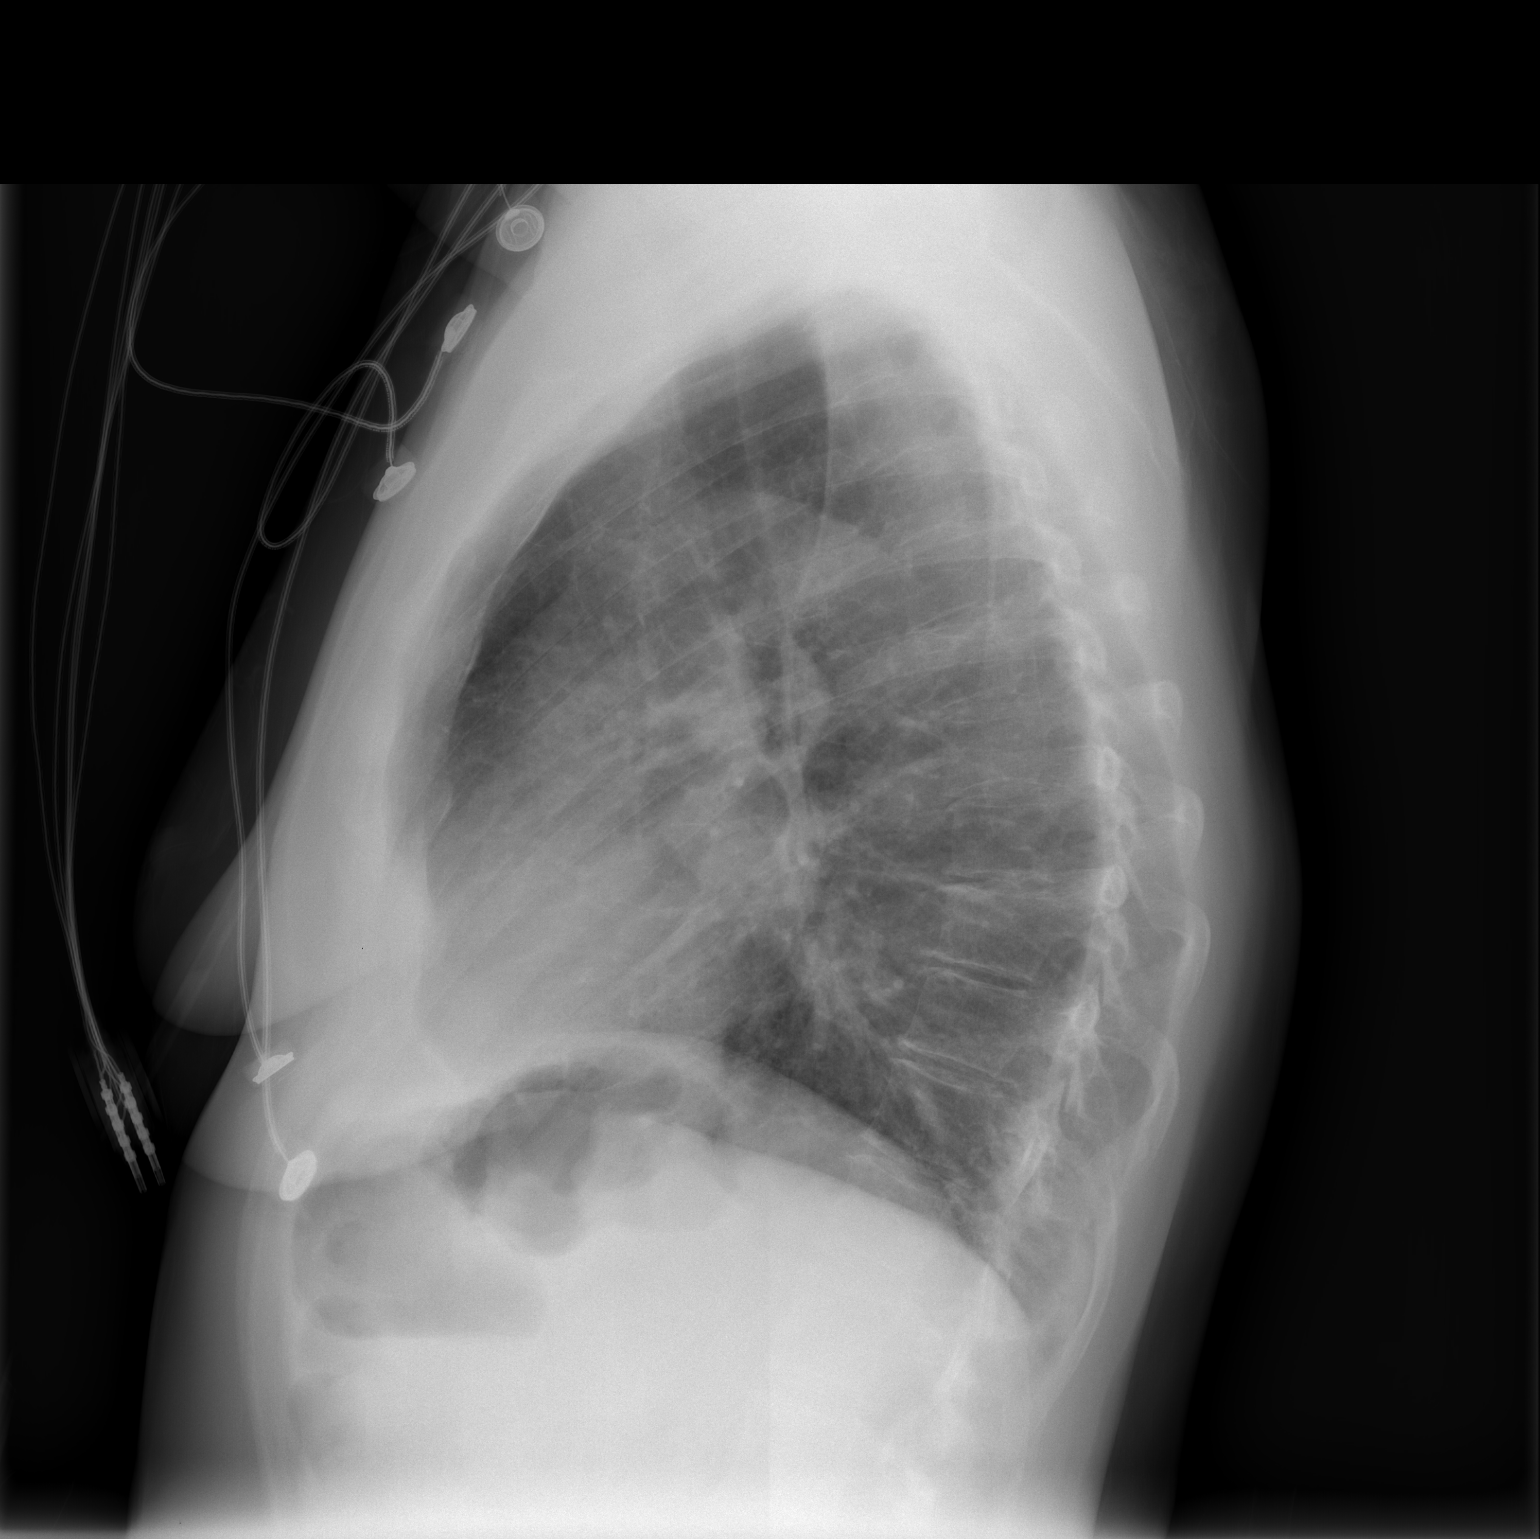

[2 of 2 positions shown; findings below may reference images not displayed]

FINDINGS: The lungs are hyperexpanded but clear.  No confluent
airspace opacities, effusions or edema is present.  Bullae noted in
the right upper lobe.  The heart is normal in size.  The upper
abdomen and osseous structures are unremarkable.
IMPRESSION: Emphysema.  No acute findings.

## 2012-10-15 ENCOUNTER — Ambulatory Visit (INDEPENDENT_AMBULATORY_CARE_PROVIDER_SITE_OTHER): Payer: Self-pay | Admitting: Surgery

## 2012-10-19 ENCOUNTER — Encounter (INDEPENDENT_AMBULATORY_CARE_PROVIDER_SITE_OTHER): Payer: Self-pay | Admitting: Surgery

## 2012-10-23 ENCOUNTER — Ambulatory Visit (INDEPENDENT_AMBULATORY_CARE_PROVIDER_SITE_OTHER): Payer: Medicare Other | Admitting: Surgery

## 2012-10-23 ENCOUNTER — Encounter (INDEPENDENT_AMBULATORY_CARE_PROVIDER_SITE_OTHER): Payer: Self-pay | Admitting: Surgery

## 2012-10-23 VITALS — BP 120/78 | HR 72 | Temp 98.4°F | Resp 18 | Ht 66.0 in | Wt 157.6 lb

## 2012-10-23 DIAGNOSIS — D1720 Benign lipomatous neoplasm of skin and subcutaneous tissue of unspecified limb: Secondary | ICD-10-CM | POA: Insufficient documentation

## 2012-10-23 DIAGNOSIS — D1779 Benign lipomatous neoplasm of other sites: Secondary | ICD-10-CM

## 2012-10-23 NOTE — Addendum Note (Signed)
Addended byLiliana Cline on: 10/23/2012 10:29 AM   Modules accepted: Orders
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
## 2012-11-18 ENCOUNTER — Emergency Department (HOSPITAL_COMMUNITY): Payer: Medicare Other

## 2012-11-18 ENCOUNTER — Encounter (HOSPITAL_COMMUNITY): Payer: Self-pay | Admitting: Adult Health

## 2012-11-18 ENCOUNTER — Inpatient Hospital Stay (HOSPITAL_COMMUNITY)
Admission: EM | Admit: 2012-11-18 | Discharge: 2012-11-20 | DRG: 247 | Disposition: A | Discharge status: Home / Self Care | Payer: Medicare Other | Attending: Cardiology | Admitting: Cardiology

## 2012-11-18 DIAGNOSIS — F329 Major depressive disorder, single episode, unspecified: Secondary | ICD-10-CM | POA: Diagnosis present

## 2012-11-18 DIAGNOSIS — Z7902 Long term (current) use of antithrombotics/antiplatelets: Secondary | ICD-10-CM

## 2012-11-18 DIAGNOSIS — R079 Chest pain, unspecified: Secondary | ICD-10-CM

## 2012-11-18 DIAGNOSIS — Z886 Allergy status to analgesic agent status: Secondary | ICD-10-CM | POA: Diagnosis not present

## 2012-11-18 DIAGNOSIS — L259 Unspecified contact dermatitis, unspecified cause: Secondary | ICD-10-CM | POA: Diagnosis present

## 2012-11-18 DIAGNOSIS — Z836 Family history of other diseases of the respiratory system: Secondary | ICD-10-CM | POA: Diagnosis not present

## 2012-11-18 DIAGNOSIS — I2510 Atherosclerotic heart disease of native coronary artery without angina pectoris: Principal | ICD-10-CM | POA: Diagnosis present

## 2012-11-18 DIAGNOSIS — G2581 Restless legs syndrome: Secondary | ICD-10-CM | POA: Diagnosis present

## 2012-11-18 DIAGNOSIS — Z9861 Coronary angioplasty status: Secondary | ICD-10-CM | POA: Diagnosis not present

## 2012-11-18 DIAGNOSIS — F419 Anxiety disorder, unspecified: Secondary | ICD-10-CM | POA: Diagnosis present

## 2012-11-18 DIAGNOSIS — M47812 Spondylosis without myelopathy or radiculopathy, cervical region: Secondary | ICD-10-CM | POA: Diagnosis present

## 2012-11-18 DIAGNOSIS — E559 Vitamin D deficiency, unspecified: Secondary | ICD-10-CM | POA: Diagnosis present

## 2012-11-18 DIAGNOSIS — F17200 Nicotine dependence, unspecified, uncomplicated: Secondary | ICD-10-CM | POA: Diagnosis present

## 2012-11-18 DIAGNOSIS — G40909 Epilepsy, unspecified, not intractable, without status epilepticus: Secondary | ICD-10-CM | POA: Diagnosis present

## 2012-11-18 DIAGNOSIS — E785 Hyperlipidemia, unspecified: Secondary | ICD-10-CM | POA: Diagnosis present

## 2012-11-18 DIAGNOSIS — Z8249 Family history of ischemic heart disease and other diseases of the circulatory system: Secondary | ICD-10-CM | POA: Diagnosis not present

## 2012-11-18 DIAGNOSIS — T82855A Stenosis of coronary artery stent, initial encounter: Secondary | ICD-10-CM

## 2012-11-18 DIAGNOSIS — Z955 Presence of coronary angioplasty implant and graft: Secondary | ICD-10-CM

## 2012-11-18 DIAGNOSIS — F411 Generalized anxiety disorder: Secondary | ICD-10-CM | POA: Diagnosis present

## 2012-11-18 DIAGNOSIS — Z7982 Long term (current) use of aspirin: Secondary | ICD-10-CM

## 2012-11-18 DIAGNOSIS — J449 Chronic obstructive pulmonary disease, unspecified: Secondary | ICD-10-CM | POA: Diagnosis present

## 2012-11-18 DIAGNOSIS — Z833 Family history of diabetes mellitus: Secondary | ICD-10-CM

## 2012-11-18 DIAGNOSIS — G4700 Insomnia, unspecified: Secondary | ICD-10-CM | POA: Diagnosis present

## 2012-11-18 DIAGNOSIS — J42 Unspecified chronic bronchitis: Secondary | ICD-10-CM

## 2012-11-18 DIAGNOSIS — J440 Chronic obstructive pulmonary disease with acute lower respiratory infection: Secondary | ICD-10-CM | POA: Diagnosis not present

## 2012-11-18 DIAGNOSIS — J209 Acute bronchitis, unspecified: Secondary | ICD-10-CM | POA: Diagnosis not present

## 2012-11-18 DIAGNOSIS — I200 Unstable angina: Secondary | ICD-10-CM | POA: Diagnosis present

## 2012-11-18 DIAGNOSIS — K219 Gastro-esophageal reflux disease without esophagitis: Secondary | ICD-10-CM | POA: Diagnosis present

## 2012-11-18 DIAGNOSIS — Z825 Family history of asthma and other chronic lower respiratory diseases: Secondary | ICD-10-CM

## 2012-11-18 DIAGNOSIS — R0600 Dyspnea, unspecified: Secondary | ICD-10-CM

## 2012-11-18 DIAGNOSIS — I25119 Atherosclerotic heart disease of native coronary artery with unspecified angina pectoris: Secondary | ICD-10-CM | POA: Diagnosis present

## 2012-11-18 DIAGNOSIS — Z79899 Other long term (current) drug therapy: Secondary | ICD-10-CM

## 2012-11-18 DIAGNOSIS — I252 Old myocardial infarction: Secondary | ICD-10-CM

## 2012-11-18 DIAGNOSIS — R0602 Shortness of breath: Secondary | ICD-10-CM | POA: Diagnosis not present

## 2012-11-18 DIAGNOSIS — J441 Chronic obstructive pulmonary disease with (acute) exacerbation: Secondary | ICD-10-CM | POA: Diagnosis present

## 2012-11-18 DIAGNOSIS — F3289 Other specified depressive episodes: Secondary | ICD-10-CM | POA: Diagnosis present

## 2012-11-18 DIAGNOSIS — R569 Unspecified convulsions: Secondary | ICD-10-CM | POA: Diagnosis present

## 2012-11-18 HISTORY — DX: Tobacco use: Z72.0

## 2012-11-18 HISTORY — DX: ST elevation (STEMI) myocardial infarction involving other coronary artery of inferior wall: I21.19

## 2012-11-18 LAB — CBC
HCT: 41.5 % (ref 36.0–46.0)
Hemoglobin: 14.3 g/dL (ref 12.0–15.0)
MCH: 29.9 pg (ref 26.0–34.0)
MCHC: 34.5 g/dL (ref 30.0–36.0)
MCV: 86.6 fL (ref 78.0–100.0)
Platelets: 220 10*3/uL (ref 150–400)
RBC: 4.79 MIL/uL (ref 3.87–5.11)
RDW: 13.5 % (ref 11.5–15.5)
WBC: 7.8 10*3/uL (ref 4.0–10.5)

## 2012-11-18 LAB — PRO B NATRIURETIC PEPTIDE: Pro B Natriuretic peptide (BNP): 83.8 pg/mL (ref 0–125)

## 2012-11-18 LAB — BASIC METABOLIC PANEL
BUN: 15 mg/dL (ref 6–23)
CO2: 29 mEq/L (ref 19–32)
Calcium: 10.1 mg/dL (ref 8.4–10.5)
Chloride: 107 mEq/L (ref 96–112)
Creatinine, Ser: 0.66 mg/dL (ref 0.50–1.10)
GFR calc Af Amer: >90 mL/min (ref >90)
GFR calc non Af Amer: >90 mL/min (ref >90)
Glucose, Bld: 95 mg/dL (ref 70–99)
Potassium: 3.9 mEq/L (ref 3.5–5.1)
Sodium: 144 mEq/L (ref 135–145)

## 2012-11-18 LAB — POCT I-STAT TROPONIN I: Troponin i, poc: 0.01 ng/mL (ref 0.00–0.08)

## 2012-11-18 MED ORDER — MORPHINE SULFATE 4 MG/ML IJ SOLN
4.0000 mg | Freq: Once | INTRAMUSCULAR | Status: AC
  Administered 2012-11-18: 4 mg via INTRAVENOUS
  Filled 2012-11-18 (×2): qty 1

## 2012-11-18 NOTE — ED Notes (Signed)
C/o CP, dizziness, nausea, HA, and productive cough
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
## 2012-11-18 NOTE — ED Notes (Signed)
Presents with one week of left sided chest pain, weakness and SOB. Chest pain radiates left arm, nothing makes pain better, nothing makes pain worse. Pain is described as dull ache and associated with nausea. Pain became worse this evening. Pt states that she had a MI last year with 3 stents placed and a 60% blockage.

## 2012-11-19 ENCOUNTER — Encounter (HOSPITAL_COMMUNITY): Payer: Self-pay | Admitting: *Deleted

## 2012-11-19 ENCOUNTER — Encounter (HOSPITAL_COMMUNITY)
Admission: EM | Disposition: A | Discharge status: Home / Self Care | Payer: Self-pay | Source: Home / Self Care | Attending: Cardiology

## 2012-11-19 DIAGNOSIS — E785 Hyperlipidemia, unspecified: Secondary | ICD-10-CM

## 2012-11-19 DIAGNOSIS — F411 Generalized anxiety disorder: Secondary | ICD-10-CM

## 2012-11-19 DIAGNOSIS — J209 Acute bronchitis, unspecified: Secondary | ICD-10-CM | POA: Diagnosis not present

## 2012-11-19 DIAGNOSIS — J440 Chronic obstructive pulmonary disease with acute lower respiratory infection: Secondary | ICD-10-CM | POA: Diagnosis not present

## 2012-11-19 DIAGNOSIS — I200 Unstable angina: Secondary | ICD-10-CM | POA: Diagnosis present

## 2012-11-19 DIAGNOSIS — I2510 Atherosclerotic heart disease of native coronary artery without angina pectoris: Secondary | ICD-10-CM | POA: Diagnosis not present

## 2012-11-19 DIAGNOSIS — J449 Chronic obstructive pulmonary disease, unspecified: Secondary | ICD-10-CM | POA: Diagnosis not present

## 2012-11-19 DIAGNOSIS — R079 Chest pain, unspecified: Secondary | ICD-10-CM

## 2012-11-19 HISTORY — PX: CORONARY ANGIOPLASTY WITH STENT PLACEMENT: SHX49

## 2012-11-19 HISTORY — PX: LEFT HEART CATHETERIZATION WITH CORONARY ANGIOGRAM: SHX5451

## 2012-11-19 LAB — CBC WITH DIFFERENTIAL/PLATELET
Basophils Absolute: 0 10*3/uL (ref 0.0–0.1)
Basophils Relative: 0 % (ref 0–1)
Eosinophils Absolute: 0.1 10*3/uL (ref 0.0–0.7)
Eosinophils Relative: 2 % (ref 0–5)
HCT: 39.6 % (ref 36.0–46.0)
Hemoglobin: 13.5 g/dL (ref 12.0–15.0)
Lymphocytes Relative: 19 % (ref 12–46)
Lymphs Abs: 1.1 10*3/uL (ref 0.7–4.0)
MCH: 29.9 pg (ref 26.0–34.0)
MCHC: 34.1 g/dL (ref 30.0–36.0)
MCV: 87.8 fL (ref 78.0–100.0)
Monocytes Absolute: 0.3 10*3/uL (ref 0.1–1.0)
Monocytes Relative: 6 % (ref 3–12)
Neutro Abs: 4.2 10*3/uL (ref 1.7–7.7)
Neutrophils Relative %: 73 % (ref 43–77)
Platelets: UNDETERMINED 10*3/uL (ref 150–400)
RBC: 4.51 MIL/uL (ref 3.87–5.11)
RDW: 13.7 % (ref 11.5–15.5)
WBC: 5.7 10*3/uL (ref 4.0–10.5)

## 2012-11-19 LAB — URINALYSIS, ROUTINE W REFLEX MICROSCOPIC
Bilirubin Urine: NEGATIVE
Glucose, UA: NEGATIVE mg/dL
Hgb urine dipstick: NEGATIVE
Ketones, ur: NEGATIVE mg/dL
Leukocytes, UA: NEGATIVE
Nitrite: NEGATIVE
Protein, ur: NEGATIVE mg/dL
Specific Gravity, Urine: 1.014 (ref 1.005–1.030)
Urobilinogen, UA: 0.2 mg/dL (ref 0.0–1.0)
pH: 6 (ref 5.0–8.0)

## 2012-11-19 LAB — COMPREHENSIVE METABOLIC PANEL
ALT: 13 U/L (ref 0–35)
AST: 16 U/L (ref 0–37)
Albumin: 3.4 g/dL — ABNORMAL LOW (ref 3.5–5.2)
Alkaline Phosphatase: 63 U/L (ref 39–117)
BUN: 14 mg/dL (ref 6–23)
CO2: 24 mEq/L (ref 19–32)
Calcium: 9 mg/dL (ref 8.4–10.5)
Chloride: 106 mEq/L (ref 96–112)
Creatinine, Ser: 0.62 mg/dL (ref 0.50–1.10)
GFR calc Af Amer: >90 mL/min (ref >90)
GFR calc non Af Amer: >90 mL/min (ref >90)
Glucose, Bld: 96 mg/dL (ref 70–99)
Potassium: 3.9 mEq/L (ref 3.5–5.1)
Sodium: 139 mEq/L (ref 135–145)
Total Bilirubin: 0.4 mg/dL (ref 0.3–1.2)
Total Protein: 6.1 g/dL (ref 6.0–8.3)

## 2012-11-19 LAB — TROPONIN I
Troponin I: <0.3 ng/mL (ref <0.30)
Troponin I: <0.3 ng/mL (ref <0.30)
Troponin I: <0.3 ng/mL (ref <0.30)

## 2012-11-19 LAB — TSH: TSH: 2.969 u[IU]/mL (ref 0.350–4.500)

## 2012-11-19 LAB — GLUCOSE, CAPILLARY
Glucose-Capillary: 100 mg/dL — ABNORMAL HIGH (ref 70–99)
Glucose-Capillary: 87 mg/dL (ref 70–99)

## 2012-11-19 LAB — PROTIME-INR
INR: 1.05 (ref 0.00–1.49)
Prothrombin Time: 13.6 seconds (ref 11.6–15.2)

## 2012-11-19 LAB — HEMOGLOBIN A1C
Hgb A1c MFr Bld: 5.8 % — ABNORMAL HIGH (ref <5.7)
Mean Plasma Glucose: 120 mg/dL — ABNORMAL HIGH (ref <117)

## 2012-11-19 LAB — MRSA PCR SCREENING: MRSA by PCR: NEGATIVE

## 2012-11-19 LAB — POCT ACTIVATED CLOTTING TIME: Activated Clotting Time: 470 seconds

## 2012-11-19 SURGERY — LEFT HEART CATHETERIZATION WITH CORONARY ANGIOGRAM
Anesthesia: LOCAL

## 2012-11-19 MED ORDER — MIDAZOLAM HCL 2 MG/2ML IJ SOLN
INTRAMUSCULAR | Status: AC
  Filled 2012-11-19: qty 2

## 2012-11-19 MED ORDER — INSULIN ASPART 100 UNIT/ML ~~LOC~~ SOLN: 0.0000 [IU] | Freq: Three times a day (TID) | SUBCUTANEOUS | Status: DC

## 2012-11-19 MED ORDER — PRASUGREL HCL 10 MG PO TABS
10.0000 mg | ORAL_TABLET | Freq: Every day | ORAL | Status: DC
  Administered 2012-11-19 – 2012-11-20 (×2): 10 mg via ORAL
  Filled 2012-11-19 (×2): qty 1

## 2012-11-19 MED ORDER — ASPIRIN 81 MG PO CHEW
81.0000 mg | CHEWABLE_TABLET | ORAL | Status: DC
  Filled 2012-11-19: qty 1
  Filled 2012-11-19: qty 3

## 2012-11-19 MED ORDER — SODIUM CHLORIDE 0.9 % IV SOLN: 1.0000 mL/kg/h | INTRAVENOUS | Status: AC

## 2012-11-19 MED ORDER — SODIUM CHLORIDE 0.9 % IV SOLN: 250.0000 mL | INTRAVENOUS | Status: DC | PRN

## 2012-11-19 MED ORDER — METOPROLOL SUCCINATE 12.5 MG HALF TABLET
12.5000 mg | ORAL_TABLET | Freq: Every day | ORAL | Status: DC
  Administered 2012-11-19: 21:00:00 12.5 mg via ORAL
  Filled 2012-11-19 (×4): qty 1

## 2012-11-19 MED ORDER — SODIUM CHLORIDE 0.9 % IJ SOLN: 3.0000 mL | Freq: Two times a day (BID) | INTRAMUSCULAR | Status: DC

## 2012-11-19 MED ORDER — SODIUM CHLORIDE 0.9 % IJ SOLN: 3.0000 mL | INTRAMUSCULAR | Status: DC | PRN

## 2012-11-19 MED ORDER — NITROGLYCERIN 0.2 MG/ML ON CALL CATH LAB
INTRAVENOUS | Status: AC
  Filled 2012-11-19: qty 1

## 2012-11-19 MED ORDER — FAMOTIDINE IN NACL 20-0.9 MG/50ML-% IV SOLN
20.0000 mg | Freq: Two times a day (BID) | INTRAVENOUS | Status: DC
  Administered 2012-11-19 (×2): 20 mg via INTRAVENOUS
  Filled 2012-11-19 (×3): qty 50

## 2012-11-19 MED ORDER — DEXTROSE 5 % IV SOLN
500.0000 mg | INTRAVENOUS | Status: DC
  Administered 2012-11-19: 500 mg via INTRAVENOUS
  Filled 2012-11-19 (×2): qty 500

## 2012-11-19 MED ORDER — DIAZEPAM 5 MG PO TABS: 5.0000 mg | ORAL_TABLET | Freq: Once | ORAL | Status: DC

## 2012-11-19 MED ORDER — PANTOPRAZOLE SODIUM 40 MG PO TBEC
40.0000 mg | DELAYED_RELEASE_TABLET | Freq: Every day | ORAL | Status: DC
  Administered 2012-11-19: 40 mg via ORAL
  Filled 2012-11-19 (×2): qty 1

## 2012-11-19 MED ORDER — ISOSORBIDE MONONITRATE ER 30 MG PO TB24
30.0000 mg | ORAL_TABLET | Freq: Every day | ORAL | Status: DC
  Administered 2012-11-19: 30 mg via ORAL
  Filled 2012-11-19 (×2): qty 1

## 2012-11-19 MED ORDER — MORPHINE SULFATE 4 MG/ML IJ SOLN
2.0000 mg | Freq: Once | INTRAMUSCULAR | Status: AC
  Administered 2012-11-19: 2 mg via INTRAVENOUS

## 2012-11-19 MED ORDER — CLONAZEPAM 0.5 MG PO TABS
2.0000 mg | ORAL_TABLET | Freq: Three times a day (TID) | ORAL | Status: DC | PRN
  Administered 2012-11-19 (×2): 2 mg via ORAL
  Filled 2012-11-19: qty 1
  Filled 2012-11-19: qty 2
  Filled 2012-11-19: qty 1

## 2012-11-19 MED ORDER — ACETAMINOPHEN 325 MG PO TABS: 650.0000 mg | ORAL_TABLET | Freq: Four times a day (QID) | ORAL | Status: DC | PRN

## 2012-11-19 MED ORDER — MORPHINE SULFATE 2 MG/ML IJ SOLN
1.0000 mg | INTRAMUSCULAR | Status: DC | PRN
  Administered 2012-11-19: 1 mg via INTRAVENOUS
  Filled 2012-11-19: qty 1

## 2012-11-19 MED ORDER — OMEGA-3 FATTY ACIDS 1000 MG PO CAPS: 2.0000 g | ORAL_CAPSULE | Freq: Every day | ORAL | Status: DC

## 2012-11-19 MED ORDER — ASPIRIN 81 MG PO CHEW
324.0000 mg | CHEWABLE_TABLET | ORAL | Status: AC
  Administered 2012-11-19: 324 mg via ORAL

## 2012-11-19 MED ORDER — SODIUM CHLORIDE 0.9 % IV SOLN
1.0000 mL/kg/h | INTRAVENOUS | Status: DC
  Administered 2012-11-19: 1 mL/kg/h via INTRAVENOUS

## 2012-11-19 MED ORDER — SIMVASTATIN 10 MG PO TABS
10.0000 mg | ORAL_TABLET | Freq: Every day | ORAL | Status: DC
  Administered 2012-11-19: 21:00:00 10 mg via ORAL
  Filled 2012-11-19 (×3): qty 1

## 2012-11-19 MED ORDER — OMEGA-3-ACID ETHYL ESTERS 1 G PO CAPS
1.0000 g | ORAL_CAPSULE | Freq: Every day | ORAL | Status: DC
  Administered 2012-11-19: 1 g via ORAL
  Filled 2012-11-19 (×2): qty 1

## 2012-11-19 MED ORDER — MORPHINE SULFATE 2 MG/ML IJ SOLN
INTRAMUSCULAR | Status: AC
  Filled 2012-11-19: qty 1

## 2012-11-19 MED ORDER — ACTIVE PARTNERSHIP FOR HEALTH OF YOUR HEART BOOK
Freq: Once | Status: AC
  Administered 2012-11-19: 20:00:00
  Filled 2012-11-19: qty 1

## 2012-11-19 MED ORDER — NITROGLYCERIN 0.4 MG SL SUBL
0.4000 mg | SUBLINGUAL_TABLET | SUBLINGUAL | Status: DC | PRN
  Administered 2012-11-19: 0.4 mg via SUBLINGUAL

## 2012-11-19 MED ORDER — FENTANYL CITRATE 0.05 MG/ML IJ SOLN
INTRAMUSCULAR | Status: AC
  Filled 2012-11-19: qty 2

## 2012-11-19 MED ORDER — TIOTROPIUM BROMIDE MONOHYDRATE 18 MCG IN CAPS
18.0000 ug | ORAL_CAPSULE | Freq: Every day | RESPIRATORY_TRACT | Status: DC
  Administered 2012-11-19: 18 ug via RESPIRATORY_TRACT
  Filled 2012-11-19 (×2): qty 5

## 2012-11-19 MED ORDER — GI COCKTAIL ~~LOC~~
30.0000 mL | Freq: Three times a day (TID) | ORAL | Status: DC | PRN
  Administered 2012-11-19: 30 mL via ORAL
  Filled 2012-11-19 (×2): qty 30

## 2012-11-19 MED ORDER — SODIUM CHLORIDE 0.9 % IV SOLN
INTRAVENOUS | Status: DC
  Administered 2012-11-19: 01:00:00 via INTRAVENOUS

## 2012-11-19 MED ORDER — ALBUTEROL SULFATE HFA 108 (90 BASE) MCG/ACT IN AERS
2.0000 | INHALATION_SPRAY | Freq: Four times a day (QID) | RESPIRATORY_TRACT | Status: DC | PRN
  Administered 2012-11-19: 2 via RESPIRATORY_TRACT
  Filled 2012-11-19: qty 6.7

## 2012-11-19 MED ORDER — ENOXAPARIN SODIUM 40 MG/0.4ML ~~LOC~~ SOLN: 40.0000 mg | SUBCUTANEOUS | Status: DC

## 2012-11-19 MED ORDER — ENOXAPARIN SODIUM 40 MG/0.4ML ~~LOC~~ SOLN
40.0000 mg | SUBCUTANEOUS | Status: DC
  Filled 2012-11-19: qty 0.4

## 2012-11-19 MED ORDER — ONDANSETRON HCL 4 MG PO TABS: 4.0000 mg | ORAL_TABLET | Freq: Four times a day (QID) | ORAL | Status: DC | PRN

## 2012-11-19 MED ORDER — BIVALIRUDIN 250 MG IV SOLR
INTRAVENOUS | Status: AC
  Filled 2012-11-19: qty 250

## 2012-11-19 MED ORDER — LIDOCAINE HCL (PF) 1 % IJ SOLN
INTRAMUSCULAR | Status: AC
  Filled 2012-11-19: qty 30

## 2012-11-19 MED ORDER — HEPARIN (PORCINE) IN NACL 2-0.9 UNIT/ML-% IJ SOLN
INTRAMUSCULAR | Status: AC
  Filled 2012-11-19: qty 1000

## 2012-11-19 MED ORDER — ACETAMINOPHEN 650 MG RE SUPP: 650.0000 mg | Freq: Four times a day (QID) | RECTAL | Status: DC | PRN

## 2012-11-19 MED ORDER — NITROGLYCERIN 0.2 MG/HR TD PT24
0.2000 mg | MEDICATED_PATCH | Freq: Every day | TRANSDERMAL | Status: DC
  Administered 2012-11-19: 0.2 mg via TRANSDERMAL
  Filled 2012-11-19: qty 1

## 2012-11-19 MED ORDER — AMLODIPINE BESYLATE 2.5 MG PO TABS
2.5000 mg | ORAL_TABLET | Freq: Every day | ORAL | Status: DC
  Administered 2012-11-19 – 2012-11-20 (×2): 2.5 mg via ORAL
  Filled 2012-11-19 (×2): qty 1

## 2012-11-19 MED ORDER — ONDANSETRON HCL 4 MG/2ML IJ SOLN: 4.0000 mg | Freq: Four times a day (QID) | INTRAMUSCULAR | Status: DC | PRN

## 2012-11-19 NOTE — Progress Notes (Signed)
[facility] Progress Note [place] TEAM 1 - Stepdown/ICU TEAM   Michele [name] WGN:[id number] DOB: 01-12-1956 DOA: 11/18/2012 PCP: Johny Blamer, MD  Brief narrative: Michele [name] is a 57 y.o. female with known history of CAD status post stenting last year for history elevation MI started experiencing chest pressure and shortness of breath over the last one week. Patient's symptoms are present at rest and increased on exertion. Denies any fever chills or any productive cough. Chest pressure is mostly on the left side and radiating to the left arm. In the ER chest x-ray EKG and cardiac enzymes were unremarkable and patient was given morphine after which chest pressure improved and has been admitted for further management. Patient did take her antiplatelet agents ncluding accidentally taking patient's mom's xarelto just before coming.   Assessment/Plan: Principal Problem:   Chest pain with moderate risk for cardiac etiology/  CAD - Post MI-PCI CFX, staged DES to RCA  11/06/11 - for cath today  Active Problems: Acute bronchtitis - start Zithromax 500 mg x 3 days- IV for now as she is NPO for cath    DYSLIPIDEMIA -cont statin    NICOTINE ADDICTION - Tobacco Abuse, long standing - advised to quit    COPD - currently no wheezing      Code Status: full Family Communication: with husbanc Disposition Plan: d/c if cath negative  Consultants: cardio  Procedures: none  Antibiotics: Start Zithro  DVT prophylaxis: lovenox  HPI/Subjective: Pt c/o cough with yellow sputum for past few days. No fever or wheezing. Currently no chest pain.    Objective: Blood pressure 103/65, pulse 73, temperature 97.4 F (36.3 C), temperature source Oral, resp. rate 17, height 5' 5.5" (1.664 m), weight 71 kg (156 lb 8.4 oz), SpO2 97.00%.  Intake/Output Summary (Last 24 hours) at 11/19/12 1550 Last data filed at 11/19/12 1153  Gross per 24 hour  Intake  799.5 ml  Output   1250 ml   Net -450.5 ml     Exam: General: No acute respiratory distress Lungs: Clear to auscultation bilaterally without wheezes or crackles Cardiovascular: Regular rate and rhythm without murmur gallop or rub normal S1 and S2 Abdomen: Nontender, nondistended, soft, bowel sounds positive, no rebound, no ascites, no appreciable mass Extremities: No significant cyanosis, clubbing, or edema bilateral lower extremities  Data Reviewed: Basic Metabolic Panel:  Recent Labs Lab 11/18/12 2045 11/19/12 0800  NA 144 139  K 3.9 3.9  CL 107 106  CO2 29 24  GLUCOSE 95 96  BUN 15 14  CREATININE 0.66 0.62  CALCIUM 10.1 9.0   Liver Function Tests:  Recent Labs Lab 11/19/12 0800  AST 16  ALT 13  ALKPHOS 63  BILITOT 0.4  PROT 6.1  ALBUMIN 3.4*   No results found for this basename: LIPASE, AMYLASE,  in the last 168 hours No results found for this basename: AMMONIA,  in the last 168 hours CBC:  Recent Labs Lab 11/18/12 2045 11/19/12 0800  WBC 7.8 5.7  NEUTROABS  --  4.2  HGB 14.3 13.5  HCT 41.5 39.6  MCV 86.6 87.8  PLT 220 PLATELET CLUMPS NOTED ON SMEAR, UNABLE TO ESTIMATE   Cardiac Enzymes:  Recent Labs Lab 11/19/12 0130 11/19/12 0800 11/19/12 1226  TROPONINI <0.30 <0.30 <0.30   BNP (last 3 results)  Recent Labs  11/18/12 2045  PROBNP 83.8   CBG:  Recent Labs Lab 11/19/12 0810 11/19/12 1148  GLUCAP 100* 87    Recent Results (from the  past 240 hour(s))  MRSA PCR SCREENING     Status: None   Collection Time    11/19/12 12:22 AM      Result Value Range Status   MRSA by PCR NEGATIVE  NEGATIVE Final   Comment:            The GeneXpert MRSA Assay (FDA     approved for NASAL specimens     only), is one component of a     comprehensive MRSA colonization     surveillance program. It is not     intended to diagnose MRSA     infection nor to guide or     monitor treatment for     MRSA infections.     Studies:  Recent x-ray studies have been reviewed in  detail by the Attending Physician  Scheduled Meds:  Scheduled Meds: . [facility] HOLD] aspirin  81 mg Oral QODAY  . [facility] HOLD] azithromycin  500 mg Intravenous Q24H  . [[facility] HOLD] diazepam  5 mg Oral Once  . [[facility] HOLD] enoxaparin (LOVENOX) injection  40 mg Subcutaneous Q24H  . [[facility] HOLD] famotidine (PEPCID) IV  20 mg Intravenous Q12H  . [[facility] HOLD] insulin aspart  0-9 Units Subcutaneous TID WC  . [[facility] HOLD] isosorbide mononitrate  30 mg Oral Daily  . [facility] HOLD] metoprolol succinate  12.5 mg Oral QHS  . [[facility] HOLD] omega-3 acid ethyl esters  1 g Oral Daily  . [[facility] HOLD] pantoprazole  40 mg Oral Daily  . [facility] [place] HOLD] prasugrel  10 mg Oral Daily  . [facility] HOLD] simvastatin  10 mg Oral q1800  . [facility] HOLD] sodium chloride  3 mL Intravenous Q12H  . sodium chloride  3 mL Intravenous Q12H  . [facility] HOLD] tiotropium  18 mcg Inhalation Daily   Continuous Infusions: . sodium chloride 50 mL/hr at 11/19/12 0127    Time spent on care of this patient: 30 min   Calvert Cantor, MD [phone]  [facility] Office  [phone] Pager - Text Page per Amion as per below:  On-Call/Text Page:      [email or web address]      password [email or web address]  If 7PM-7AM, please contact night-coverage [email or web address] Password [email or web address] 11/19/2012, 3:50 PM   LOS: 1 day

## 2012-11-19 NOTE — Progress Notes (Signed)
Site area: right groin  Site Prior to Removal:  Level 0  Pressure Applied For 20 MINUTES    Minutes Beginning at 1745 Manual:   yes  Patient Status During Pull:  stable  Post Pull Groin Site:  Level 0  Post Pull Instructions Given:  yes  Post Pull Pulses Present:  yes  Dressing Applied:  yes  Comments:  Gauze dressing dry and intact applied at 1805

## 2012-11-19 NOTE — Progress Notes (Signed)
Utilization review completed. Jalesia Loudenslager, RN, BSN. 

## 2012-11-19 NOTE — ED Provider Notes (Signed)
I saw and evaluated the patient, reviewed the resident's note and I agree with the findings and plan and agree with their ECG interpretation. Patient with chest pain. Previous MI. EKG reassuring. Admitted for further management  Juliet Rude. Rubin Payor, MD 11/19/12 2322

## 2012-11-19 NOTE — Progress Notes (Signed)
1:59 AM   lovenox for dvt ppx has been purposfully changed to start 6/20 due to patient mistakenly taking her mother's Xarelto right before admission. ~2000 on 6/18

## 2012-11-19 NOTE — Brief Op Note (Signed)
11/18/2012 - 11/19/2012  3:57 PM  PATIENT:  Michele [name]  57 y.o. female with a history of inferolateral STEMI in May 2013 with occluded AV groove circumflex prior to OM 3. This is treated with overlapping Promus DES stents. She was then brought back a month later for staged PCI the RCA 80% lesion. She presented last night with symptoms concerning for a stable angina. She was referred for cardiac catheterization with possible PCI.  PRE-OPERATIVE DIAGNOSIS:  UNSTABLE ANGINA  POST-OPERATIVE DIAGNOSIS:   95% distal in-stent restenosis of distal stent in the AV groove circumflex at AV groove-OM 3 bifurcation  Widely patent proximal portion of the stent in the circumflex and widely patent RCA stent with stable residual 40% distal RCA.  No significant disease in the LAD system  Well-preserved ejection fraction - with mild basilar lateral hypokinesis.  Aortic pressures 129/69 mm Hg: Mean 96 mmHg.  Left ventricular pressures: 124/12 mmHg; LVEDP 16 mmHg  PROCEDURE:  Procedure(s): LEFT HEART CATHETERIZATION WITH CORONARY ANGIOGRAM (N/A)  SURGEON:  Surgeon(s) and Role:    * Marykay Lex, MD - Primary  ANESTHESIA:   local and IV sedation; local: 18 ml lidocaine; IV: 4 mg Versed, 100 mcg fentanyl  EBL: Less than 10 mL  DIAGNOSTIC:  5 French RFA access, upsized to 6 [place] for PCI  5 French JL4, JR 4 for left and right coronary angiography; angled pigtail catheter for ventriculography and hemodynamics.  PCI: Complex bifurcation PCI  XB 3.5 Guide; BMW wire for AV groove circumflex, pro-water wire for OM 3  Predilation balloon: Sprinter Legend 2.0 mm x 12 mm - AV groove circumflex across OM 3  Flextome Cutting Balloon 2.0 mm at 10 mm -- PTCA of ostial OM 3; pre-stent  AV groove stent: Promus Premier 2.5 mg x 12 mmm - post dilated to 2.65 mm  Pro-water wire was pulled back prior to stent deployment. Then redirected into the OM 3 after stent deployment  Redilation of OM 3 ostium:  Sprinter Legend 2.0 mm 12 mm --> then exchanged for Flextome Cutting Balloon 2.0 mm x 10 mm - 10 Atm x 60 Sec - 2.1 mm diameter  Brisk TIMI 3 flow after 6th IC NTG dose.  MEDICATIONS USED:    OMNIPAQUE CONTRAST: 180 mL   Angiomax bolus & gtt - d/c'd after PCI  Intracoronary nitroglycerin: 200 mcg x6   6 French sheath:  sutured in place after RFA angiogram demonstrated excellent stent location but high bifurcation.   DICTATION: .Note written in EPIC  PLAN OF CARE: Admit to inpatient ; plan DC in the morning.   Nitroglycerin paste overnight to treat possible residual spasm   PATIENT DISPOSITION:  PACU - hemodynamically stable. continues to complain of random pains in the shoulders and chest. EKG without any ischemic changes.    Marykay Lex, M.D., M.S. [facility] [address] [place], [place]  [address]  [phone] Pager # [phone] 11/19/2012 4:14 PM
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
## 2012-11-20 DIAGNOSIS — J42 Unspecified chronic bronchitis: Secondary | ICD-10-CM | POA: Diagnosis not present

## 2012-11-20 DIAGNOSIS — I200 Unstable angina: Secondary | ICD-10-CM

## 2012-11-20 DIAGNOSIS — J209 Acute bronchitis, unspecified: Secondary | ICD-10-CM | POA: Diagnosis present

## 2012-11-20 DIAGNOSIS — I2510 Atherosclerotic heart disease of native coronary artery without angina pectoris: Secondary | ICD-10-CM | POA: Diagnosis not present

## 2012-11-20 DIAGNOSIS — E785 Hyperlipidemia, unspecified: Secondary | ICD-10-CM | POA: Diagnosis not present

## 2012-11-20 DIAGNOSIS — J440 Chronic obstructive pulmonary disease with acute lower respiratory infection: Secondary | ICD-10-CM | POA: Diagnosis not present

## 2012-11-20 DIAGNOSIS — R079 Chest pain, unspecified: Secondary | ICD-10-CM | POA: Diagnosis not present

## 2012-11-20 LAB — BASIC METABOLIC PANEL
BUN: 11 mg/dL (ref 6–23)
CO2: 26 mEq/L (ref 19–32)
Calcium: 8.5 mg/dL (ref 8.4–10.5)
Chloride: 108 mEq/L (ref 96–112)
Creatinine, Ser: 0.73 mg/dL (ref 0.50–1.10)
GFR calc Af Amer: >90 mL/min (ref >90)
GFR calc non Af Amer: >90 mL/min (ref >90)
Glucose, Bld: 90 mg/dL (ref 70–99)
Potassium: 3.9 mEq/L (ref 3.5–5.1)
Sodium: 141 mEq/L (ref 135–145)

## 2012-11-20 LAB — CBC
HCT: 38.4 % (ref 36.0–46.0)
Hemoglobin: 12.7 g/dL (ref 12.0–15.0)
MCH: 28.7 pg (ref 26.0–34.0)
MCHC: 33.1 g/dL (ref 30.0–36.0)
MCV: 86.9 fL (ref 78.0–100.0)
Platelets: 173 10*3/uL (ref 150–400)
RBC: 4.42 MIL/uL (ref 3.87–5.11)
RDW: 13.4 % (ref 11.5–15.5)
WBC: 6.1 10*3/uL (ref 4.0–10.5)

## 2012-11-20 MED ORDER — AMLODIPINE BESYLATE 2.5 MG PO TABS: 2.5000 mg | ORAL_TABLET | Freq: Every day | ORAL | Status: DC

## 2012-11-20 MED ORDER — AZITHROMYCIN 250 MG PO TABS
250.0000 mg | ORAL_TABLET | Freq: Every day | ORAL | Status: DC
  Administered 2012-11-20: 11:00:00 250 mg via ORAL
  Filled 2012-11-20: qty 1

## 2012-11-20 MED ORDER — OMEPRAZOLE 20 MG PO CPDR: 20.0000 mg | DELAYED_RELEASE_CAPSULE | Freq: Two times a day (BID) | ORAL | Status: DC

## 2012-11-20 MED ORDER — ATORVASTATIN CALCIUM 20 MG PO TABS: 20.0000 mg | ORAL_TABLET | Freq: Every day | ORAL | Status: DC

## 2012-11-20 MED ORDER — ACETAMINOPHEN 325 MG PO TABS: 650.0000 mg | ORAL_TABLET | Freq: Four times a day (QID) | ORAL | Status: DC | PRN

## 2012-11-20 MED ORDER — METOPROLOL SUCCINATE ER 25 MG PO TB24: 12.5000 mg | ORAL_TABLET | Freq: Every day | ORAL | Status: DC

## 2012-11-20 MED ORDER — AZITHROMYCIN 250 MG PO TABS: 250.0000 mg | ORAL_TABLET | Freq: Every day | ORAL | Status: AC

## 2012-11-20 MED FILL — Sodium Chloride IV Soln 0.9%: INTRAVENOUS | Qty: 50 | Status: AC

## 2012-11-20 NOTE — Progress Notes (Addendum)
CARDIAC REHAB PHASE I   PRE:  Rate/Rhythm: 67 SR    BP: sitting [address]    SaO2:   MODE:  Ambulation: 1000 ft   POST:  Rate/Rhythm: 84 SR    BP: sitting [address]     SaO2:   Pt c/o feeling tired, increased SOB with distance. No CP. Has bad cough. C/o fingers turning purple with ambulation. She is concerned with this. Tried to review ed and encourage smoking cessation. Pt only slightly receptive. Seems to have an excuse or problem with most solutions to risk factor modification. Does watch her diet. Gave smoking cessation resource. Not sure she is ready to quit. Would be interested in nicotine inhalers if she could get financial assistance with them. Not interested in CRPII due to not having the gas money for transport. [phone]  Harriet Masson CES, ACSM 11/20/2012 9:02 AM

## 2012-11-20 NOTE — Discharge Summary (Signed)
Patient ID: Michele [name],  MRN: [id number], DOB/AGE: 1955-11-25 57 y.o.  Admit date: 11/18/2012 Discharge date: 11/20/2012  Primary Care Provider: Dr Johny Blamer Primary Cardiologist: Dr [name]  Discharge Diagnoses Principal Problem:   Unstable angina Active Problems:   CAD -S/P MI-PCI AVG 5/13 then staged DES to RCA  11/06/11. ISR- PCI 11/19/12   DYSLIPIDEMIA   COPD   Bronchitis, acute   ANXIETY   NICOTINE ADDICTION - Tobacco Abuse, long standing   RESTLESS LEG SYNDROME   GERD   SEIZURE DISORDER    Procedures:  Cath/PCI with DES  11/19/12 for ISR of distal AV groove-OM3 bifurcation   Hospital Course: The patient is a complicated 57 year old female followed in our group for coronary disease. She has had a DMI in May of 2013 treated with a circumflex drug-eluting stent. She had some residual disease in the RCA that was intervened on in a staged fashion. Her ejection fraction is 50-55%. Her condition is complicated by severe anxiety. She is followed by Dr [name]. She was admitted through the ER with complaints of chest and SOB.  Her Troponin and EKG were normal but she continued to have chest and it was decided to proceed with diagnostic cath. This was done 11/20/12 and revealed ISR of the AV groove stent at the bifurcation of the OM3. This was intervened on with a DES by Dr [name] 11/19/12. She did have superimposed bronchitis and was discharged on antibiotics. She has a lot of anxiety and multiple complaints. We made her a TCM pt at discharge to try and prevent early re admission.  Discharge Vitals:  Blood pressure 118/61, pulse 65, temperature 97.7 F (36.5 C), temperature source Oral, resp. rate 18, height 5' 5.5" (1.664 m), weight 73.9 kg (162 lb 14.7 oz), SpO2 96.00%.    Labs: Results for orders placed during the hospital encounter of 11/18/12 (from the past 48 hour(s))  CBC     Status: None   Collection Time    11/18/12  8:45 PM      Result Value Range   WBC 7.8  4.0 -  10.5 K/uL   RBC 4.79  3.87 - 5.11 MIL/uL   Hemoglobin 14.3  12.0 - 15.0 g/dL   HCT 40.9  81.1 - 91.4 %   MCV 86.6  78.0 - 100.0 fL   MCH 29.9  26.0 - 34.0 pg   MCHC 34.5  30.0 - 36.0 g/dL   RDW 78.2  95.6 - 21.3 %   Platelets 220  150 - 400 K/uL  BASIC METABOLIC PANEL     Status: None   Collection Time    11/18/12  8:45 PM      Result Value Range   Sodium 144  135 - 145 mEq/L   Potassium 3.9  3.5 - 5.1 mEq/L   Chloride 107  96 - 112 mEq/L   CO2 29  19 - 32 mEq/L   Glucose, Bld 95  70 - 99 mg/dL   BUN 15  6 - 23 mg/dL   Creatinine, Ser 0.86  0.50 - 1.10 mg/dL   Calcium 57.8  8.4 - 46.9 mg/dL   GFR calc non Af Amer >90  >90 mL/min   GFR calc Af Amer >90  >90 mL/min   Comment:            The eGFR has been calculated     using the CKD EPI equation.     This calculation has not been  validated in all clinical     situations.     eGFR's persistently     <90 mL/min signify     possible Chronic Kidney Disease.  PRO B NATRIURETIC PEPTIDE     Status: None   Collection Time    11/18/12  8:45 PM      Result Value Range   Pro B Natriuretic peptide (BNP) 83.8  0 - 125 pg/mL  POCT I-STAT TROPONIN I     Status: None   Collection Time    11/18/12  9:02 PM      Result Value Range   Troponin i, poc 0.01  0.00 - 0.08 ng/mL   Comment 3            Comment: Due to the release kinetics of cTnI,     a negative result within the first hours     of the onset of symptoms does not rule out     myocardial infarction with certainty.     If myocardial infarction is still suspected,     repeat the test at appropriate intervals.  MRSA PCR SCREENING     Status: None   Collection Time    11/19/12 12:22 AM      Result Value Range   MRSA by PCR NEGATIVE  NEGATIVE   Comment:            The GeneXpert MRSA Assay (FDA     approved for NASAL specimens     only), is one component of a     comprehensive MRSA colonization     surveillance program. It is not     intended to diagnose MRSA      infection nor to guide or     monitor treatment for     MRSA infections.  TROPONIN I     Status: None   Collection Time    11/19/12  1:30 AM      Result Value Range   Troponin I <0.30  <0.30 ng/mL   Comment:            Due to the release kinetics of cTnI,     a negative result within the first hours     of the onset of symptoms does not rule out     myocardial infarction with certainty.     If myocardial infarction is still suspected,     repeat the test at appropriate intervals.  URINALYSIS, ROUTINE W REFLEX MICROSCOPIC     Status: None   Collection Time    11/19/12  3:09 AM      Result Value Range   Color, Urine YELLOW  YELLOW   APPearance CLEAR  CLEAR   Specific Gravity, Urine 1.014  1.005 - 1.030   pH 6.0  5.0 - 8.0   Glucose, UA NEGATIVE  NEGATIVE mg/dL   Hgb urine dipstick NEGATIVE  NEGATIVE   Bilirubin Urine NEGATIVE  NEGATIVE   Ketones, ur NEGATIVE  NEGATIVE mg/dL   Protein, ur NEGATIVE  NEGATIVE mg/dL   Urobilinogen, UA 0.2  0.0 - 1.0 mg/dL   Nitrite NEGATIVE  NEGATIVE   Leukocytes, UA NEGATIVE  NEGATIVE   Comment: MICROSCOPIC NOT DONE ON URINES WITH NEGATIVE PROTEIN, BLOOD, LEUKOCYTES, NITRITE, OR GLUCOSE <1000 mg/dL.  TROPONIN I     Status: None   Collection Time    11/19/12  8:00 AM      Result Value Range   Troponin I <0.30  <0.30 ng/mL   Comment:  Due to the release kinetics of cTnI,     a negative result within the first hours     of the onset of symptoms does not rule out     myocardial infarction with certainty.     If myocardial infarction is still suspected,     repeat the test at appropriate intervals.  COMPREHENSIVE METABOLIC PANEL     Status: Abnormal   Collection Time    11/19/12  8:00 AM      Result Value Range   Sodium 139  135 - 145 mEq/L   Potassium 3.9  3.5 - 5.1 mEq/L   Chloride 106  96 - 112 mEq/L   CO2 24  19 - 32 mEq/L   Glucose, Bld 96  70 - 99 mg/dL   BUN 14  6 - 23 mg/dL   Creatinine, Ser 1.61  0.50 - 1.10 mg/dL    Calcium 9.0  8.4 - 09.6 mg/dL   Total Protein 6.1  6.0 - 8.3 g/dL   Albumin 3.4 (*) 3.5 - 5.2 g/dL   AST 16  0 - 37 U/L   ALT 13  0 - 35 U/L   Alkaline Phosphatase 63  39 - 117 U/L   Total Bilirubin 0.4  0.3 - 1.2 mg/dL   GFR calc non Af Amer >90  >90 mL/min   GFR calc Af Amer >90  >90 mL/min   Comment:            The eGFR has been calculated     using the CKD EPI equation.     This calculation has not been     validated in all clinical     situations.     eGFR's persistently     <90 mL/min signify     possible Chronic Kidney Disease.  CBC WITH DIFFERENTIAL     Status: None   Collection Time    11/19/12  8:00 AM      Result Value Range   WBC 5.7  4.0 - 10.5 K/uL   Comment: WHITE COUNT CONFIRMED ON SMEAR   RBC 4.51  3.87 - 5.11 MIL/uL   Hemoglobin 13.5  12.0 - 15.0 g/dL   HCT 04.5  40.9 - 81.1 %   MCV 87.8  78.0 - 100.0 fL   MCH 29.9  26.0 - 34.0 pg   MCHC 34.1  30.0 - 36.0 g/dL   RDW 91.4  78.2 - 95.6 %   Platelets PLATELET CLUMPS NOTED ON SMEAR, UNABLE TO ESTIMATE  150 - 400 K/uL   Neutrophils Relative % 73  43 - 77 %   Lymphocytes Relative 19  12 - 46 %   Monocytes Relative 6  3 - 12 %   Eosinophils Relative 2  0 - 5 %   Basophils Relative 0  0 - 1 %   Neutro Abs 4.2  1.7 - 7.7 K/uL   Lymphs Abs 1.1  0.7 - 4.0 K/uL   Monocytes Absolute 0.3  0.1 - 1.0 K/uL   Eosinophils Absolute 0.1  0.0 - 0.7 K/uL   Basophils Absolute 0.0  0.0 - 0.1 K/uL   Smear Review MORPHOLOGY UNREMARKABLE    TSH     Status: None   Collection Time    11/19/12  8:00 AM      Result Value Range   TSH 2.969  0.350 - 4.500 uIU/mL  HEMOGLOBIN A1C     Status: Abnormal   Collection Time    11/19/12  8:00 AM      Result Value Range   Hemoglobin A1C 5.8 (*) <5.7 %   Comment: (NOTE)                                                                               According to the ADA Clinical Practice Recommendations for 2011, when     HbA1c is used as a screening test:      >=6.5%   Diagnostic of  Diabetes Mellitus               (if abnormal result is confirmed)     5.7-6.4%   Increased risk of developing Diabetes Mellitus     References:Diagnosis and Classification of Diabetes Mellitus,Diabetes     Care,2011,34(Suppl 1):S62-S69 and Standards of Medical Care in             Diabetes - 2011,Diabetes Care,2011,34 (Suppl 1):S11-S61.   Mean Plasma Glucose 120 (*) <117 mg/dL  GLUCOSE, CAPILLARY     Status: Abnormal   Collection Time    11/19/12  8:10 AM      Result Value Range   Glucose-Capillary 100 (*) 70 - 99 mg/dL  PROTIME-INR     Status: None   Collection Time    11/19/12 11:24 AM      Result Value Range   Prothrombin Time 13.6  11.6 - 15.2 seconds   INR 1.05  0.00 - 1.49  GLUCOSE, CAPILLARY     Status: None   Collection Time    11/19/12 11:48 AM      Result Value Range   Glucose-Capillary 87  70 - 99 mg/dL  TROPONIN I     Status: None   Collection Time    11/19/12 12:26 PM      Result Value Range   Troponin I <0.30  <0.30 ng/mL   Comment:            Due to the release kinetics of cTnI,     a negative result within the first hours     of the onset of symptoms does not rule out     myocardial infarction with certainty.     If myocardial infarction is still suspected,     repeat the test at appropriate intervals.  POCT ACTIVATED CLOTTING TIME     Status: None   Collection Time    11/19/12  2:56 PM      Result Value Range   Activated Clotting Time 470    CBC     Status: None   Collection Time    11/20/12  5:12 AM      Result Value Range   WBC 6.1  4.0 - 10.5 K/uL   RBC 4.42  3.87 - 5.11 MIL/uL   Hemoglobin 12.7  12.0 - 15.0 g/dL   HCT 16.1  09.6 - 04.5 %   MCV 86.9  78.0 - 100.0 fL   MCH 28.7  26.0 - 34.0 pg   MCHC 33.1  30.0 - 36.0 g/dL   RDW 40.9  81.1 - 91.4 %   Platelets 173  150 - 400 K/uL  BASIC METABOLIC PANEL     Status: None   Collection Time  11/20/12  5:12 AM      Result Value Range   Sodium 141  135 - 145 mEq/L   Potassium 3.9  3.5 - 5.1  mEq/L   Chloride 108  96 - 112 mEq/L   CO2 26  19 - 32 mEq/L   Glucose, Bld 90  70 - 99 mg/dL   BUN 11  6 - 23 mg/dL   Creatinine, Ser 4.54  0.50 - 1.10 mg/dL   Calcium 8.5  8.4 - 09.8 mg/dL   GFR calc non Af Amer >90  >90 mL/min   GFR calc Af Amer >90  >90 mL/min   Comment:            The eGFR has been calculated     using the CKD EPI equation.     This calculation has not been     validated in all clinical     situations.     eGFR's persistently     <90 mL/min signify     possible Chronic Kidney Disease.    Disposition:      Follow-up Information   Follow up with Abelino Derrick, PA-C On 11/30/2012. (10:00)    Contact information:   [address] [place] [address] [phone]       Discharge Medications:    Medication List    STOP taking these medications       XARELTO PO   (she had taken one of her mother's Xarelto by "mistake" prior to admission)      TAKE these medications       acetaminophen 325 MG tablet  Commonly known as:  TYLENOL  Take 2 tablets (650 mg total) by mouth every 6 (six) hours as needed.     albuterol 108 (90 BASE) MCG/ACT inhaler  Commonly known as:  PROVENTIL HFA;VENTOLIN HFA  Inhale 2 puffs into the lungs every 6 (six) hours as needed. For shortness of breath     amLODipine 2.5 MG tablet  Commonly known as:  NORVASC  Take 1 tablet (2.5 mg total) by mouth daily.     aspirin 81 MG EC tablet  Take 81 mg by mouth every other day. Swallow whole.     azithromycin 250 MG tablet  Commonly known as:  ZITHROMAX  Take 1 tablet (250 mg total) by mouth daily.  Start taking on:  11/21/2012     CALCIUM 600+D PO  Take 1 tablet by mouth 2 (two) times daily.     clonazePAM 2 MG tablet  Commonly known as:  KLONOPIN  Take 2 mg by mouth 3 (three) times daily as needed for anxiety.     CoQ10 100 MG Caps  Take 1 tablet by mouth daily.     fish oil-omega-3 fatty acids 1000 MG capsule  Take 2 g by mouth daily.     isosorbide  mononitrate 30 MG 24 hr tablet  Commonly known as:  IMDUR  Take 30 mg by mouth daily.     metoprolol succinate 25 MG 24 hr tablet  Commonly known as:  TOPROL-XL  Take 0.5 tablets (12.5 mg total) by mouth at bedtime.     NITROSTAT 0.4 MG SL tablet  Generic drug:  nitroGLYCERIN  Place 0.4 mg under the tongue every 5 (five) minutes as needed for chest pain.     omeprazole 20 MG capsule  Commonly known as:  PRILOSEC  Take 1 capsule (20 mg total) by mouth 2 (two) times daily.  prasugrel 10 MG Tabs  Commonly known as:  EFFIENT  Take 1 tablet (10 mg total) by mouth daily.     pravastatin 20 MG tablet  Commonly known as:  PRAVACHOL  Take 20 mg by mouth at bedtime.     tiotropium 18 MCG inhalation capsule  Commonly known as:  SPIRIVA HANDIHALER  Place 1 capsule (18 mcg total) into inhaler and inhale daily.         Duration of Discharge Encounter: Greater than 30 minutes including physician time.  Jolene Provost PA-C 11/20/2012 10:36 AM

## 2012-11-20 NOTE — Progress Notes (Signed)
Subjective:  C/O productive cough, white sputum.   Objective:  Vital Signs in the last 24 hours: Temp:  [97.4 F (36.3 C)-98.1 F (36.7 C)] 97.7 F (36.5 C) (06/20 0548) Pulse Rate:  [51-76] 62 (06/20 0548) Resp:  [14-21] 20 (06/20 0548) BP: (99-131)/(48-90) 104/61 mmHg (06/20 0548) SpO2:  [92 %-99 %] 95 % (06/20 0548) Weight:  [73.9 kg (162 lb 14.7 oz)] 73.9 kg (162 lb 14.7 oz) (06/20 0013)  Intake/Output from previous day:  Intake/Output Summary (Last 24 hours) at 11/20/12 0757 Last data filed at 11/20/12 0549  Gross per 24 hour  Intake 1625.5 ml  Output   1700 ml  Net  -74.5 ml    Physical Exam: General appearance: alert, cooperative and no distress Lungs: few exp wheezes on Rt otherwise clear Heart: regular rate and rhythm Rt groin without hematoma   Rate: 62  Rhythm: normal sinus rhythm  Lab Results:  Recent Labs  11/19/12 0800 11/20/12 0512  WBC 5.7 6.1  HGB 13.5 12.7  PLT PLATELET CLUMPS NOTED ON SMEAR, UNABLE TO ESTIMATE 173    Recent Labs  11/19/12 0800 11/20/12 0512  NA 139 141  K 3.9 3.9  CL 106 108  CO2 24 26  GLUCOSE 96 90  BUN 14 11  CREATININE 0.62 0.73    Recent Labs  11/19/12 0800 11/19/12 1226  TROPONINI <0.30 <0.30   Hepatic Function Panel  Recent Labs  11/19/12 0800  PROT 6.1  ALBUMIN 3.4*  AST 16  ALT 13  ALKPHOS 63  BILITOT 0.4   No results found for this basename: CHOL,  in the last 72 hours  Recent Labs  11/19/12 1124  INR 1.05    Imaging: Imaging results have been reviewed  Cardiac Studies:  Assessment/Plan:   Principal Problem:   Unstable angina Active Problems:   CAD -S/P MI-PCI AVG 5/13 then staged DES to RCA  11/06/11. ISR- PCI 11/19/12   DYSLIPIDEMIA   COPD   Bronchitis, acute   ANXIETY   NICOTINE ADDICTION - Tobacco Abuse, long standing   RESTLESS LEG SYNDROME   GERD   SEIZURE DISORDER    PLAN:  Stable from CV standpoint s/p complex PCI to AVG, OM3 wiith DES for ISR. No post PCI  Troponin ordered. She has COPD and cough. WBC WNL, no fever. Possible discharge later today. Change IV Zithromax to po to complete 5 days course started yesterday. Change to po Nitrates and PPI. With her complex ISR/PCI and significant anxiety I suspect she will require close follow up to prevent re hospitalization for chest pain. Will discharge as a TCM pt, I can see her in follow up in one week.   Corine Shelter PA-C  Beeper [phone] 11/20/2012, 7:57 AM   I have seen and examined the patient along with Corine Shelter PA-C .  I have reviewed the chart, notes and new data.  I agree with PA's note.  Key new complaints: left hand fingers purple with ambulation, "twinge" in chest at rest Key examination changes: normal exam - no cyanosis of either hand, lips or toes; no clinical signs of CHF   PLAN: DC home, early follow up. Reinforced critical role of dual antiplatelet therapy. Risk of recurrent in-stent restenosis is real, obviously not imminent. Spent about 25 minutes discussing smoking cessation. Claims she understands that it is important, but says it is hard and she can't stop cold [place]. Her husband smokes. Offered several suggestions to help with smoking cessation, but she seems more  preoccupied in finding reasons not to implement them, rather than truly cooperating with these suggestions.  Thurmon Fair, MD, [facility] [phone] 11/20/2012, 9:42 AM

## 2012-11-20 NOTE — CV Procedure (Signed)
CARDIAC CATHETERIZATION AND PERCUTANEOUS CORONARY INTERVENTION REPORT  NAME:  Michele [name]   MRN: [id number] DOB:  15-Apr-1956   ADMIT DATE: 11/18/2012 Procedure Date: 11/20/2012  INTERVENTIONAL CARDIOLOGIST: Marykay Lex, M.D., MS PRIMARY CARE PROVIDER: Johny Blamer, MD PRIMARY CARDIOLOGIST: Marykay Lex, MD, MS  PATIENT:  Michele [name] is a 57 y.o. female patient of Dr. 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 She also had a severe RCA lesion was treated in a staged fashion in June of 2013 with a Promus DES stent. She had a residual 50% distal RCA lesion. She had a nuclear stress test in October that was negative for any ischemia. She persistently had ongoing episodes of chest discomfort and shortness of breath. She actually was evaluated with a CPET test which did show low peak VO2. Just this past weekend she was out hiking and walking and doing well without any problems. However after that she started noticing more significant chest discomfort that is now occurring at rest. He is deathly worse with exertion. Based on her intensity of and resting symptoms, her symptoms are concerning for unstable angina.   She is referred for cardiac catheterization to ensure that there is no progression of disease or in-stent restenosis.   PRE-OPERATIVE DIAGNOSIS:    Unstable Angina  Known Coronary Disease  PROCEDURES PERFORMED:    Left Heart Catheterization with Native Coronary Angiography  Successful Bifurcation Percutaneous Intervention on bifurcation AV groove circumflex-OM 3 95% lesion with a Promus Premier DES stent placed in the AV groove circumflex across the OM 3 with rescue Cutting Balloon PTCA of the ostium of the OM 3.  PROCEDURE:Consent:  Risks of procedure as well as the alternatives and risks of each were explained to the (patient/caregiver).  Consent for  procedure obtained. Consent for signed by MD and patient with RN witness -- placed on chart.   PROCEDURE: The patient was brought to the 2nd Floor [facility] in the fasting state and prepped and draped in the usual sterile fashion for Right groin or radial access. A modified Allen's test with plethysmography was performed, revealing excellent Ulnar artery collateral flow.  Sterile technique was used including antiseptics, cap, gloves, gown, hand hygiene, mask and sheet.  Skin prep: Chlorhexidine.  Time Out: Verified patient identification, verified procedure, site/side was marked, verified correct patient position, special equipment/implants available, medications/allergies/relevent history reviewed, required imaging and test results available.  Performed  Access: Right Common Femoral Artery; 5 Fr Sheath -- fluoroscopically guided modified Seldinger technique   Diagnostic:  JL4, JR 4, Angled Pigtail  Left Coronary Artery Angiography: JL4  Right Coronary Artery Angiography: JR4  LV Hemodynamics (LV Gram): Angled Pigtail - (post PCI)  SHEATH:  6 Fr sheath sutured in place.  To be removed with manual pressure held for hemostasis.  MEDICATIONS:  Anesthesia:  Local Lidocaine 16 ml  Sedation:  4 mg IV Versed, 100 mcg IV fentanyl ;   Premedication: Klonopin 1 mg  Omnipaque Contrast: 180 ml  Anticoagulation: Angiomax Bolus & drip  Anti-Platelet Agent:  Standing Effient dose 10 mg  Intracoronary Nitroglycerin Injection: 200 mcg x 6  Hemodynamics:  Central Aortic / Mean Pressures: 129/69 mmHg; 96 mmHg  Left Ventricular Pressures / EDP: 124/12 mmHg; 16 mmHg  Left Ventriculography:  EF: 55-60 %  Wall Motion: mild basal inferior hypokinesis.  Coronary Anatomy:  Left Main: Large-caliber  vessel, bifurcates into the LAD and Circumflex, angiographically normal  LAD: Large-caliber vessel gives rise several small sub-perforators as well as a mid vessel  major diagonal branch before it wraps around the apex. Minimal luminal irregularities.  D1: Moderate caliber vessel bifurcates along the lateral wall; minimal luminal irregularities  Left Circumflex: Large-caliber vessel to rise to a proximal moderate to large caliber OM1 that bifurcates reaching toward the anterolateral apex followed by a widely patentoverlaping stented segment; there is then a second smaller OM 2 and then bifurcated OM 3 in the AV groove circumflex/posterior lateral system of 2 small posterior lateral branches. Just prior to bifurcation of the AV groove circumflex in the OM 3, there is a 95% stenosis involving the distal portion of the stent prior to going into OM 3. This involves the indication with both the ostium of OM 3 in the AV groove circumflex beyond the bifurcation involved.  OM1: Moderate to large caliber vessel bifurcates terminally; Minimal luminal irregularities.  OM 2: Small to moderate caliber vessel; minimal luminal irregularities   OM 3: Moderate to large caliber vessel with ostial involvement of the distal portion of the stent extending into this branch at the proximal portion. Beyond this, the vessel is a small-caliber vessel but with minimal luminal irregularities   NWG:NFAOZ large-caliber vessel with diffuse 20-30% irregularities proximally to widely patent stent just prior to the genu, this is followed by two  stable appearing 40-50% lesions with the most distal just prior to bifurcation into a small moderate caliber PDA and very small right posterior lateral system with minimal luminal irregularities.  Reviewing the diagnostic angiography, the clear-cut culprit lesion is the bifurcation AV groove circumflex-OM 3 lesion. Both branches are involved, requiring bifurcation PCI as with dual wiring.  As the OM 3 was stented previously, the plan is to stent across the this ostium into the AV groove and balloon angioplasty with cutting balloon the ostium of the OM 3    Percutaneous Coronary Intervention:  Sheath exchanged for 6 Fr Guide: 6 Fr    XB 3.5 Guidewires:  OM 3 - Pro-Water ; AV Groove Circumflex BMW  Bilateral wires cause near occlusion of the lesion, and the patient had significant chest pain.  Predilation Balloon: Sprinter Legend   2.0 mm x 12 mm; across the bifurcation into the AV groove circumflex  8 Atm x 30 Sec, 10  Atm x 45 Sec  Post-inflation revealed improved flow down both branches, however there was significant stenosis noted in the ostial OM 3. The decision was made then to use Cutting Balloon PTCA of this vessel first prior to stenting across it.  With rescue PTCA  following if needed.  Flextome Cutting Balloon 2.0 mm x 10 mm: Ostium of OM 3  8 Atm x 45 Sec  This restored TIMI-3 flow downstream. His chest pain is also resolved. No dissection or perforation. Attention was then returned to the AV groove circumflex  Stent in AV circumflex crossing OM 3: Promus Premier DES 2.5 mm 12 mm; the Pro-Water wire was removed in the OM 3 prior to deployment.   12 Atm x 30 Sec  Postdilated with stent balloon: 16 Atm x 60 Sec; final diameter 2.65 mm  Post-deployment angiography revealed significant ostial OM 3 stenosis, so attention was then returned to this vessel. The pro-water was then re-inserted into the OM 3 to the recently placed stent. The Sprinter Legend predilation balloon was then used to over-the-counter the Cutting Balloon.  Predilation Balloon - ostial  OM 3: Sprinter Legend   2.0 mm x 12 mm  8 Atm x 30 Sec,  Flextome Cutting Balloon 2.0 mm x 10 mm:  10 Atm x 60 Sec,   Final Diameter: 2.1 mm   Post deployment angiography in multiple views, with and without guidewire in place revealed excellent stent deployment and lesion coverage.  There was no evidence of dissection or perforation.  TIMI-3 flow is noted in both branches with no residual lesion.  PATIENT DISPOSITION:    The patient was transferred to the PACU holding  area in a hemodynamicaly stable, chest pain free condition.  The patient tolerated the procedure well, and there were no complications.  EBL:   <  20 ml  The patient was stable before, during, and after the procedure.  POST-OPERATIVE DIAGNOSIS:    Severe in-stent stenosis of previously placed DES stent in the AV groove circumflex and OM 3 involving the follow on AV groove circumflex.  Successful complex bifurcation PCI involving stent placement through the previous stent into the AV groove circumflex across the OM 3 followed by rescue PTCA with cutting balloon of the ostial OM 3 stented segment.  Widely patent RCA stent with stable moderate RCA and LAD lesions.   For 30 section fraction with normal EDP.  PLAN OF CARE:  The patient has significant discomfort in the procedure and will likely have some spasm. Will use nitroglycerin paste overnight and will increase her amlodipine dose due to her hand spasms as well as possible coronary spasm.   Monitor over night, continue dual antiplatelet therapy  Anticipate discharge in the morning.  Report will be sent to Primary Physician.   Marykay Lex, M.D., M.S. [facility] [address] [place], [place]  [address]  [phone]  11/20/2012 10:36 AM

## 2012-11-23 ENCOUNTER — Telehealth: Payer: Self-pay | Admitting: Cardiology

## 2012-11-23 ENCOUNTER — Other Ambulatory Visit: Payer: Self-pay | Admitting: Cardiology

## 2012-11-23 ENCOUNTER — Telehealth: Payer: Self-pay | Admitting: Physician Assistant

## 2012-11-23 NOTE — Telephone Encounter (Signed)
Transitional care management followup  Patient indicates that she is increasing her walk, 2 minutes every day and is currently up to 12 minutes. She is noting some chest discomfort which resolves quickly and every time she walks her left hand turns blue to the knuckles. She doesn't report any associated pain in the arm.   We discussed using the sublingual nitroglycerin for any chest pain that will not resolve on its own and to go to the emergency room if it gets severe.  She also finished up her azithromycin as noted continuing cough with white sputum. She got with her primary care provider who recommended Delsym cough suppressant and provided a prescription for another antibiotic.   We confirmed her followup appointment with Vianne Bulls, Mandel Seiden 6:56 PM.

## 2012-11-23 NOTE — Telephone Encounter (Signed)
Discharged on 6-20-coughing a lot-cough all night long,cant sleep-needs something for it!

## 2012-11-23 NOTE — Telephone Encounter (Signed)
Returned call.  Pt stated she was given a prescription for abx for 3 days.  Stated she is coughing up white phlegm and wants something for the cough.  Pt advised to contact her PCP for further evaluation and treatment as the cough would be managed w/ her PCP.  Pt verbalized understanding and agreed w/ plan.

## 2012-11-24 NOTE — Telephone Encounter (Signed)
Rx was sent to pharmacy electronically. 

## 2012-11-30 ENCOUNTER — Encounter: Payer: Self-pay | Admitting: Cardiology

## 2012-11-30 ENCOUNTER — Ambulatory Visit (INDEPENDENT_AMBULATORY_CARE_PROVIDER_SITE_OTHER): Payer: Medicare Other | Admitting: Cardiology

## 2012-11-30 VITALS — BP 98/68 | HR 78 | Ht 65.5 in | Wt 157.4 lb

## 2012-11-30 DIAGNOSIS — E785 Hyperlipidemia, unspecified: Secondary | ICD-10-CM

## 2012-11-30 DIAGNOSIS — I2510 Atherosclerotic heart disease of native coronary artery without angina pectoris: Secondary | ICD-10-CM

## 2012-11-30 DIAGNOSIS — J209 Acute bronchitis, unspecified: Secondary | ICD-10-CM

## 2012-11-30 DIAGNOSIS — R600 Localized edema: Secondary | ICD-10-CM | POA: Insufficient documentation

## 2012-11-30 DIAGNOSIS — F411 Generalized anxiety disorder: Secondary | ICD-10-CM

## 2012-11-30 DIAGNOSIS — F17200 Nicotine dependence, unspecified, uncomplicated: Secondary | ICD-10-CM

## 2012-11-30 NOTE — Assessment & Plan Note (Signed)
Swelling and discoloration of her Lt arm per pt

## 2012-11-30 NOTE — Progress Notes (Signed)
11/30/2012 Michele [name]   1956-05-18  [id number]  Primary Physicia Johny Blamer, MD Primary Cardiologist: Dr [name]  HPI:  The patient is a complicated 57 year old female followed in our group for coronary disease. She has had a DMI in May of 2013 treated with a circumflex drug-eluting stent. She had some residual disease in the RCA that was intervened on in a staged fashion. Her ejection fraction is 50-55%. Her condition is complicated by severe anxiety. She is followed by Dr [name]. She was admitted through the ER 11/18/12 with complaints of chest and SOB. Her Troponin and EKG were normal but she continued to have chest and it was decided to proceed with diagnostic cath. This was done 11/19/12 and revealed ISR of the AV groove stent at the bifurcation of the OM3. This was intervened on with a DES by Dr [name] 11/19/12. She did have superimposed bronchitis and was discharged on antibiotics.           She is here today as a TCM follow up. She has multiple complaints and it was felt she would be a high risk for readmission. Today she tells me she continues to have left sided chest pain, "not as bad". She does say she walked 30 minutes yesterday for exercise without problems. She unfortunately continues to smoke but is trying to quit. She also complained of red/purple discoloration of her left arm and hand when walking. She says she has had some swelling of that arm as well.     Current Outpatient Prescriptions  Medication Sig Dispense Refill  . acetaminophen (TYLENOL) 325 MG tablet Take 2 tablets (650 mg total) by mouth every 6 (six) hours as needed.      Michele [name] Kitchen albuterol (PROVENTIL HFA;VENTOLIN HFA) 108 (90 BASE) MCG/ACT inhaler Inhale 2 puffs into the lungs every 6 (six) hours as needed. For shortness of breath      . amLODipine (NORVASC) 2.5 MG tablet Take 1 tablet (2.5 mg total) by mouth daily.  30 tablet  11  . amoxicillin-clavulanate (AUGMENTIN) 875-125 MG per tablet Take 1 tablet by mouth 2  (two) times daily. 3 more days left      . aspirin 81 MG EC tablet Take 81 mg by mouth daily. Swallow whole.      . Calcium Carbonate-Vitamin D (CALCIUM 600+D PO) Take 1 tablet by mouth 2 (two) times daily.       . clonazePAM (KLONOPIN) 2 MG tablet Take 2 mg by mouth 3 (three) times daily as needed for anxiety.      . Coenzyme Q10 (COQ10) 100 MG CAPS Take 1 tablet by mouth daily.       Michele [name] Kitchen EFFIENT 10 MG TABS TAKE 1 TABLET BY MOUTH EVERY DAY  30 tablet  8  . fish oil-omega-3 fatty acids 1000 MG capsule Take 2 g by mouth daily.      . isosorbide mononitrate (IMDUR) 30 MG 24 hr tablet Take 30 mg by mouth daily.       . metoprolol succinate (TOPROL-XL) 25 MG 24 hr tablet Take 0.5 tablets (12.5 mg total) by mouth at bedtime.  15 tablet  30  . NITROSTAT 0.4 MG SL tablet Place 0.4 mg under the tongue every 5 (five) minutes as needed for chest pain.       Michele [name] Kitchen omeprazole (PRILOSEC) 20 MG capsule Take 1 capsule (20 mg total) by mouth 2 (two) times daily.  60 capsule  5  . pravastatin (PRAVACHOL) 20 MG tablet Take 20 mg by  mouth at bedtime.      Michele [name] Kitchen tiotropium (SPIRIVA HANDIHALER) 18 MCG inhalation capsule Place 1 capsule (18 mcg total) into inhaler and inhale daily.  30 capsule  12   No current facility-administered medications for this visit.    Allergies  Allergen Reactions  . Ibuprofen Other (See Comments)    GI upset  . Sulfonamide Derivatives Itching and Rash  . Aspirin Other (See Comments)    REACTION: GI upset (takes baby aspirin at night)  . Crestor (Rosuvastatin)   . Lipitor (Atorvastatin)     History   Social History  . Marital Status: Divorced    Spouse Name: N/A    Number of Children: 5  . Years of Education: N/A   Occupational History  . Disabled     Social History Main Topics  . Smoking status: Current Some Day Smoker -- 1.00 packs/day for 40 years    Types: Cigarettes    Last Attempt to Quit: 11/06/2011  . Smokeless tobacco: Never Used     Comment: 04/15/12 "I quit once for  2 1/2 years;  smoking cessation counselor already here to visit"  . Alcohol Use: No  . Drug Use: No  . Sexually Active: Not Currently    Birth Control/ Protection: Post-menopausal   Other Topics Concern  . 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  Divorced.  Quit smoking 06/2007 but restarted 1/11.   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   0 Caffeine drinks daily      Review of Systems: General: negative for chills, fever, night sweats or weight changes.  Cardiovascular: negative for, dyspnea on exertion, orthopnea, palpitations, paroxysmal nocturnal dyspnea or shortness of breath Dermatological: negative for rash Respiratory: negative for cough or wheezing Urologic: negative for hematuria Abdominal: negative for nausea, vomiting, diarrhea, bright red blood per rectum, melena, or hematemesis Neurologic: negative for visual changes, syncope, or dizziness All other systems reviewed and are otherwise negative except as noted above.    Blood pressure 98/68, pulse 78, height 5' 5.5" (1.664 m), weight 157 lb 6.4 oz (71.396 kg).  General appearance: alert, cooperative, no distress and anxious Lungs: clear to auscultation bilaterally Heart: regular rate and rhythm Extremities: Good pulses in both upper extremities. B/P 108/66 on Rt, 110/56 on Lt  EKG  EKG: normal EKG, normal sinus rhythm, unchanged from previous tracings.  ASSESSMENT AND PLAN:   CAD -S/P MI-PCI AVG 5/13 then staged DES to RCA  11/06/11. ISR- PCI 11/19/12 Some chest pain but able to walk without problems  Bronchitis, acute Second round of ABs per primary MD seems to have resolved this  NICOTINE ADDICTION - Tobacco Abuse, long standing Discussed the importance of not smoking  ANXIETY Multiple minor complaints  DYSLIPIDEMIA Statin intol  Edema of upper extremity Swelling and discoloration of her Lt arm per pt   PLAN  I  discussed the importance of smoking cessation with the pt and she says she is trying to quit, she is down to 1/2 pk a day. She is quit concerned about the discoloration and "swelling" in her Lt arm but I did not observe that today in the office. I did order a venous doppler to R/O upper extremity DVT. She will return in 3 months. I reassured her that her stent should stay open, especially if she takes good care of herself.    Myrikal Messmer KPA-C 11/30/2012 1:43 PM

## 2012-11-30 NOTE — Assessment & Plan Note (Signed)
Second round of ABs per primary MD seems to have resolved this

## 2012-11-30 NOTE — Assessment & Plan Note (Signed)
Multiple minor complaints

## 2012-11-30 NOTE — Assessment & Plan Note (Signed)
Statin intol 

## 2012-11-30 NOTE — Assessment & Plan Note (Signed)
Some chest pain but able to walk without problems

## 2012-11-30 NOTE — Assessment & Plan Note (Signed)
Discussed the importance of not smoking

## 2012-11-30 NOTE — Patient Instructions (Addendum)
Same medications, continue to work on smoking. Get doppler of Lt arm, return to clinic 3 mos.

## 2012-12-01 ENCOUNTER — Telehealth: Payer: Self-pay | Admitting: Cardiology

## 2012-12-01 NOTE — Telephone Encounter (Signed)
Called by patient due to taking extra ISMN 30 mg this evening. Asymptomatic.  No dizziness, syncope, presyncope, headache. Recommended she take it easy for the rest of the night with orthostatic pre-cautions. Indications for 911 discussed (syncope, presyncope). Recommended pill box for organization. FYI to Dr. [name].

## 2012-12-02 ENCOUNTER — Telehealth: Payer: Self-pay | Admitting: *Deleted

## 2012-12-02 ENCOUNTER — Encounter (HOSPITAL_COMMUNITY): Payer: Medicare Other

## 2012-12-02 NOTE — Telephone Encounter (Signed)
Called patient to follow up after hours call last night concerning taking an extra dose of medication. Pt states she is doing well today no problems noted.

## 2012-12-02 NOTE — Telephone Encounter (Signed)
I guess we should follow this up to make sure she is OK.  Marykay Lex, MD

## 2012-12-10 ENCOUNTER — Ambulatory Visit (HOSPITAL_COMMUNITY)
Admission: RE | Admit: 2012-12-10 | Discharge: 2012-12-10 | Disposition: A | Discharge status: Home / Self Care | Payer: Medicare Other | Source: Ambulatory Visit | Attending: Cardiovascular Disease | Admitting: Cardiovascular Disease

## 2012-12-10 DIAGNOSIS — J441 Chronic obstructive pulmonary disease with (acute) exacerbation: Secondary | ICD-10-CM | POA: Diagnosis not present

## 2012-12-10 DIAGNOSIS — M7989 Other specified soft tissue disorders: Secondary | ICD-10-CM | POA: Diagnosis not present

## 2012-12-10 DIAGNOSIS — R609 Edema, unspecified: Secondary | ICD-10-CM | POA: Insufficient documentation

## 2012-12-10 DIAGNOSIS — R600 Localized edema: Secondary | ICD-10-CM

## 2012-12-10 NOTE — Progress Notes (Signed)
Left Upper Extremity Venous Duplex Completed. Michele [name]

## 2012-12-11 ENCOUNTER — Telehealth: Payer: Self-pay | Admitting: *Deleted

## 2012-12-11 NOTE — Telephone Encounter (Signed)
Message copied by Tobin Chad on Fri Dec 11, 2012  5:15 PM ------      Message from: [facility], DAVID      Created: Fri Dec 11, 2012  3:41 PM       Normal LUE Vein Doppler - no clot.            Marykay Lex, MD       ------

## 2012-12-11 NOTE — Telephone Encounter (Signed)
Results given. Voiced understanding.

## 2012-12-19 DIAGNOSIS — F411 Generalized anxiety disorder: Secondary | ICD-10-CM

## 2012-12-19 DIAGNOSIS — J209 Acute bronchitis, unspecified: Secondary | ICD-10-CM

## 2012-12-19 DIAGNOSIS — R609 Edema, unspecified: Secondary | ICD-10-CM

## 2012-12-19 DIAGNOSIS — F17200 Nicotine dependence, unspecified, uncomplicated: Secondary | ICD-10-CM

## 2012-12-19 DIAGNOSIS — I2510 Atherosclerotic heart disease of native coronary artery without angina pectoris: Secondary | ICD-10-CM | POA: Diagnosis not present

## 2012-12-19 DIAGNOSIS — E785 Hyperlipidemia, unspecified: Secondary | ICD-10-CM

## 2012-12-22 ENCOUNTER — Encounter: Payer: Self-pay | Admitting: Cardiology

## 2012-12-22 ENCOUNTER — Ambulatory Visit (INDEPENDENT_AMBULATORY_CARE_PROVIDER_SITE_OTHER): Payer: Medicare Other | Admitting: Cardiology

## 2012-12-22 VITALS — BP 136/76 | HR 78 | Ht 65.5 in | Wt 155.0 lb

## 2012-12-22 DIAGNOSIS — F17200 Nicotine dependence, unspecified, uncomplicated: Secondary | ICD-10-CM | POA: Diagnosis not present

## 2012-12-22 DIAGNOSIS — E785 Hyperlipidemia, unspecified: Secondary | ICD-10-CM

## 2012-12-22 DIAGNOSIS — F411 Generalized anxiety disorder: Secondary | ICD-10-CM

## 2012-12-22 DIAGNOSIS — Z9861 Coronary angioplasty status: Secondary | ICD-10-CM

## 2012-12-22 DIAGNOSIS — I7389 Other specified peripheral vascular diseases: Secondary | ICD-10-CM

## 2012-12-22 DIAGNOSIS — Z720 Tobacco use: Secondary | ICD-10-CM

## 2012-12-22 DIAGNOSIS — R030 Elevated blood-pressure reading, without diagnosis of hypertension: Secondary | ICD-10-CM

## 2012-12-22 DIAGNOSIS — I2510 Atherosclerotic heart disease of native coronary artery without angina pectoris: Secondary | ICD-10-CM | POA: Diagnosis not present

## 2012-12-22 DIAGNOSIS — K219 Gastro-esophageal reflux disease without esophagitis: Secondary | ICD-10-CM

## 2012-12-22 MED ORDER — METOPROLOL SUCCINATE ER 25 MG PO TB24: 25.0000 mg | ORAL_TABLET | Freq: Every day | ORAL | Status: DC

## 2012-12-22 MED ORDER — NICOTINE 10 MG IN INHA: 1.0000 | RESPIRATORY_TRACT | Status: DC | PRN

## 2012-12-22 NOTE — Progress Notes (Signed)
12/22/2012  Michele [name]   Dec 07, 1955  [id number]  Primary Physicia Johny Blamer, MD Primary Cardiologist: Dr [name] Chief Complaint  Patient presents with  . Follow-up    result doppler,some chest pain,PCP resp issues started antibiotics -had episode w/shakes pcp stopped med ,swelling fellhurt knee and ankle,swelling in fingers,some sob    HPI:  The patient is a complicated 57 year old female followed in our group for coronary disease. She has had a inferior STEMI in May of 2013 treated with a Circumflex drug-eluting stent. She also had significant RCA disease that was treated in a staged fashion with a drug-eluting stent about a month later and 13. She did fairly well overall for cardiac standpoint, but was admitted through the ER 11/18/12 with complaints of chest and SOB with negative troponin and normal ECG, however with recurrent, persistent episodes of chest pain she was taken to cardiac catheterization lab on 11/19/12 and revealed distal stent ISR of the AV groove stent at the bifurcation of the AV groove circumflex and OM3 (that had the original stent placed into it across the AV groove). This was intervened on with a DES with rescue PTCA of the stented portion of the proximal OM 3. There is now stent going from the native circumflex into the OM 3 as well as across the into the distal AV groove circumflex.   She did have superimposed bronchitis and was discharged on antibiotics.  Her condition is complicated by severe anxiety, yet she is absolutely reluctant to take any medication for it . She was seen by Corine Shelter on the June 30 for TCM follow up, and as expected had multiple complaints. She noted having left sided chest pain, just "not as bad". Despite that, she was able to walk 30 minutes for exercise without problems. She unfortunately continues to smoke but is trying to quit as she once did and that her MI.. She also complained of red/purple discoloration of her left arm and  hand when walking. -- That was evaluated with an upper extremity.ultrasound showing no evidence of DVT.  Interval history: So she is here today to followup from her Dopplers that did not show any of the malleus to she still has been intermittent discomfort in her chest. She is back to taking antibiotics again. She is however exercising and doing relatively well without any discomfort while exercising. She does get short of breath when she walks around quite a bit but that's no change from her baseline prior to her MI. More related to her COPD. He denies any PND, orthopnea, or edema. She still notes symptoms suggestive of acrocyanosis.  Has intermittent dizziness but nothing significant. No syncope or near syncope no TIA or amaurosis fugax symptoms. No melena, hematochezia hematuria. No hemoptysis or nosebleeds. No claudication symptoms.   Current Outpatient Prescriptions  Medication Sig Dispense Refill  . acetaminophen (TYLENOL) 325 MG tablet Take 2 tablets (650 mg total) by mouth every 6 (six) hours as needed.      [name] albuterol (PROVENTIL HFA;VENTOLIN HFA) 108 (90 BASE) MCG/ACT inhaler Inhale 2 puffs into the lungs every 6 (six) hours as needed. For shortness of breath      . amLODipine (NORVASC) 2.5 MG tablet Take 1 tablet (2.5 mg total) by mouth daily.  30 tablet  11  . aspirin 81 MG EC tablet Take 81 mg by mouth daily. Swallow whole.      . Calcium Carbonate-Vitamin D (CALCIUM 600+D PO) Take 1 tablet by mouth 2 (two) times daily.       [name]  clonazePAM (KLONOPIN) 2 MG tablet Take 2 mg by mouth 3 (three) times daily as needed for anxiety.      . Coenzyme Q10 (COQ10) 100 MG CAPS Take 1 tablet by mouth daily.       [name] EFFIENT 10 MG TABS TAKE 1 TABLET BY MOUTH EVERY DAY  30 tablet  8  . fish oil-omega-3 fatty acids 1000 MG capsule Take 2 g by mouth daily.      . isosorbide mononitrate (IMDUR) 30 MG 24 hr tablet Take 30 mg by mouth daily.       [name] NITROSTAT 0.4 MG SL tablet Place 0.4 mg under the tongue  every 5 (five) minutes as needed for chest pain.       [name] omeprazole (PRILOSEC) 20 MG capsule Take 1 capsule (20 mg total) by mouth 2 (two) times daily.  60 capsule  5  . pravastatin (PRAVACHOL) 20 MG tablet Take 20 mg by mouth at bedtime.      [name] tiotropium (SPIRIVA HANDIHALER) 18 MCG inhalation capsule Place 1 capsule (18 mcg total) into inhaler and inhale daily.  30 capsule  12  . metoprolol succinate (TOPROL XL) 25 MG 24 hr tablet Take 1 tablet (25 mg total) by mouth daily.  30 tablet  6  . nicotine (NICOTROL) 10 MG inhaler Inhale 1 puff into the lungs as needed for smoking cessation.  42 each  3   No current facility-administered medications for this visit.    Allergies  Allergen Reactions  . Ibuprofen Other (See Comments)    GI upset  . Sulfonamide Derivatives Itching and Rash  . Aspirin Other (See Comments)    REACTION: GI upset (takes baby aspirin at night)  . Crestor (Rosuvastatin)   . Lipitor (Atorvastatin)     History   Social History  . Marital Status: Divorced    Spouse Name: N/A    Number of Children: 5  . Years of Education: N/A   Occupational History  . Disabled     Social History Main Topics  . Smoking status: Current Every Day Smoker -- 0.50 packs/day for 40 years    Types: Cigarettes  . Smokeless tobacco: Never Used     Comment: 04/15/12 "I quit once for 2 1/2 years;  smoking cessation counselor already here to visit"  . Alcohol Use: No  . Drug Use: No  . Sexually Active: Not Currently    Birth Control/ Protection: Post-menopausal   Other Topics Concern  . 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  Quit smoking 06/2007 but restarted 1/11 -- smoking a pack a day. 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   0 Caffeine drinks daily      Review of Systems: Still notes that pain and  tightness in her hands, that is somewhat better General: negative for chills, fever, night sweats or weight changes.  Cardiovascular: Noted above Dermatological: negative for rash Respiratory: negative for cough or wheezing Urologic: negative for hematuria Abdominal: negative for nausea, vomiting, diarrhea, bright red blood per rectum, melena, or hematemesis Neurologic: negative for visual changes, syncope, or dizziness All other systems reviewed and are otherwise negative except as noted above.  PHYSICAL EXAMINATION: Blood pressure 136/76, pulse 78, height 5' 5.5" (1.664 m), weight 155 lb (70.308 kg).  General appearance: alert,  cooperative, no distress and Just anxious; smells like a cigarette HEENT: [place]/AT, EOMI, MMM, anicteric sclera Neck: no adenopathy, no carotid bruit, no JVD, supple, symmetrical, trachea midline and thyroid not enlarged, symmetric, no tenderness/mass/nodules Lungs: clear to auscultation bilaterally, normal percussion bilaterally and Nonlabored, mild interstitial sounds. Heart: regular rate and rhythm, S1, S2 normal, no murmur, click, rub or gallop and normal apical impulse Abdomen: soft, non-tender; bowel sounds normal; no masses,  no organomegaly Extremities: extremities normal, atraumatic, no cyanosis or edema and No significant blotching or purple discoloration of the upper extremities. Pulses: 2+ and symmetric Neurologic: Grossly normal; very frazzled and easily distracted.  EKG  EKG: normal EKG, normal sinus rhythm, unchanged from previous tracings.  ASSESSMENT AND PLAN:   CAD -S/P MI-PCI AVG 5/13 then staged DES to RCA  11/06/11. ISR- PCI 11/19/12 I do not think the symptoms she is having her chest or anginal. She's able exercise doubt significant problems.  I do think there may be a small component of coronary vasospasm especially with her having these symptoms her hands. I started her on amlodipine prior to discharge the hospital. We'll go titrate this up and I  will help both her hands and the intermittent discomfort in her chest which is also on Imdur.  She remains on dual antiplatelet therapy with aspirin and Effient, and is on a statin as well as beta blocker. She would not be an ACE inhibitor during her last hospitalization do to lower blood pressures., And the intention to start calcium channel blocker. With normal EF and no diabetes. Findings the Calcitrel blocker. Her blood pressure go up and weight continue to titrate the Calcitrel blocker that may be an option.  We'll put on a lot of to increase her beta blocker with her COPD.  DYSLIPIDEMIA She is on pravastatin, this is followed by her primary physician. Does have problems with liver not Crestor and Lipitor. As it is tolerated, that is a small victory.  GERD This is an issue with her hiatal hernia, and it may be the reason for some of her discomfort. She is on a PPI which is a vital importance with her being on Effient. We talked for a while about switching her over to Plavix, but that may be more likely to irritate her stomach. If we did we would be to switch her from Prilosec to pantoprazole or Dexilant. There is no interaction between Plavix and is to medications.  ANXIETY She clearly very anxious and almost the point of being depressed. She says it has a lot of stress involved being the main caregiver for her mother. Her mother's health is worse. She is in rehabilitation, and she is just not able to take care of herself alone her mother. She doesn't take any time for herself to do think she needs to do.  I asked his coworker for this. I told her she needs to be looking out for herself. She has to take time to do her exercise, she has to take time to think about smoking cessation, and she needs to cut her life and enjoy her life. This means we need to treat her with an angulated or an SSRI or SNRI. She needs to talk her primary physician about doing that. It would probably would help with  smoking cessation.  NICOTINE ADDICTION - Tobacco Abuse, long standing After the initial 15 minutes of talking about her coronary disease and other symptoms, I spent close to 20 minutes talking with her about her anxiety  and smoking cessation me. We talked about all the things she's tried including: Patches, electronic cigarettes, Nicorette chewing gum, Wellbutrin -- she is asked to use the Nicotrol inhalers which she tried someone else's and that seemed to be helpful.  I think she's getting some help from a sociologic standpoint as well. 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 The increased stresses and make it much more difficult for her to quit smoking, that the reason why started back.  I think she needs to see her primary physician and talk about the plan for treating her generalized anxiety and depression symptoms.  Acrocyanosis For lack of better diagnosis her hands being swollen tender are possibly symptoms and suggestive of possible Raynaud's.  I was thinking of possible vasospasm when I started her on the Norvasc. Would continue to titrate his medication up as her blood pressure tolerates.  There is no evidence of DVT on Doppler exam.   Total time the patient: 1 hour  Followup: 6 months  HARDING,DAVID W, MD [facility] 12/22/2012

## 2012-12-22 NOTE — Patient Instructions (Addendum)
Your physician recommends that you schedule a follow-up appointment in: 6 month  

## 2012-12-28 ENCOUNTER — Telehealth: Payer: Self-pay | Admitting: Cardiology

## 2012-12-28 NOTE — Telephone Encounter (Signed)
Her Inhalers have been denied by her insurance-need authorization.

## 2012-12-28 NOTE — Telephone Encounter (Signed)
Left message - will contact Ms Guadalupe once prior-auth.has an answer approval or denial

## 2013-01-01 ENCOUNTER — Encounter: Payer: Self-pay | Admitting: Cardiology

## 2013-01-01 DIAGNOSIS — I7389 Other specified peripheral vascular diseases: Secondary | ICD-10-CM | POA: Insufficient documentation

## 2013-01-01 DIAGNOSIS — R030 Elevated blood-pressure reading, without diagnosis of hypertension: Secondary | ICD-10-CM | POA: Insufficient documentation

## 2013-01-01 DIAGNOSIS — Z720 Tobacco use: Secondary | ICD-10-CM | POA: Insufficient documentation

## 2013-01-01 NOTE — Assessment & Plan Note (Signed)
This is an issue with her hiatal hernia, and it may be the reason for some of her discomfort. She is on a PPI which is a vital importance with her being on Effient. We talked for a while about switching her over to Plavix, but that may be more likely to irritate her stomach. If we did we would be to switch her from Prilosec to pantoprazole or Dexilant. There is no interaction between Plavix and is to medications.

## 2013-01-01 NOTE — Assessment & Plan Note (Signed)
I do not think the symptoms she is having her chest or anginal. She's able exercise doubt significant problems.  I do think there may be a small component of coronary vasospasm especially with her having these symptoms her hands. I started her on amlodipine prior to discharge the hospital. We'll go titrate this up and I will help both her hands and the intermittent discomfort in her chest which is also on Imdur.  She remains on dual antiplatelet therapy with aspirin and Effient, and is on a statin as well as beta blocker. She would not be an ACE inhibitor during her last hospitalization do to lower blood pressures., And the intention to start calcium channel blocker. With normal EF and no diabetes. Findings the Calcitrel blocker. Her blood pressure go up and weight continue to titrate the Calcitrel blocker that may be an option.  We'll put on a lot of to increase her beta blocker with her COPD.

## 2013-01-01 NOTE — Assessment & Plan Note (Signed)
After the initial 15 minutes of talking about her coronary disease and other symptoms, I spent close to 20 minutes talking with her about her anxiety and smoking cessation me. We talked about all the things she's tried including: Patches, electronic cigarettes, Nicorette chewing gum, Wellbutrin -- she is asked to use the Nicotrol inhalers which she tried someone else's and that seemed to be helpful.  I think she's getting some help from a sociologic standpoint as well. 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 The increased stresses and make it much more difficult for her to quit smoking, that the reason why started back.  I think she needs to see her primary physician and talk about the plan for treating her generalized anxiety and depression symptoms.

## 2013-01-01 NOTE — Assessment & Plan Note (Signed)
She clearly very anxious and almost the point of being depressed. She says it has a lot of stress involved being the main caregiver for her mother. Her mother's health is worse. She is in rehabilitation, and she is just not able to take care of herself alone her mother. She doesn't take any time for herself to do think she needs to do.  I asked his coworker for this. I told her she needs to be looking out for herself. She has to take time to do her exercise, she has to take time to think about smoking cessation, and she needs to cut her life and enjoy her life. This means we need to treat her with an angulated or an SSRI or SNRI. She needs to talk her primary physician about doing that. It would probably would help with smoking cessation.

## 2013-01-01 NOTE — Assessment & Plan Note (Signed)
She is on pravastatin, this is followed by her primary physician. Does have problems with liver not Crestor and Lipitor. As it is tolerated, that is a small victory.

## 2013-01-01 NOTE — Assessment & Plan Note (Addendum)
For lack of better diagnosis her hands being swollen tender are possibly symptoms and suggestive of possible Raynaud's.  I was thinking of possible vasospasm when I started her on the Norvasc. Would continue to titrate his medication up as her blood pressure tolerates.  There is no evidence of DVT on Doppler exam.

## 2013-01-08 ENCOUNTER — Telehealth: Payer: Self-pay | Admitting: *Deleted

## 2013-01-08 NOTE — Telephone Encounter (Signed)
Informed patient that we received a denial from the insurance company for nicotrol inhaler. It is not on her formulary.Even with a letter sent to company .  The letter was sent again to [employer]

## 2013-01-14 ENCOUNTER — Telehealth: Payer: Self-pay | Admitting: Cardiology

## 2013-01-14 NOTE — Telephone Encounter (Signed)
Returned call.  Pt informed message received about inhaler.  Informed per telephone note by Jasmine December, RN w/ pt, she is aware it was denied and it has been sent to appeals.  Pt informed Jasmine December and Dr. [name] are out of the office and Jasmine December will update her when she returns and has more information.  Pt verbalized understanding and agreed w/ plan.

## 2013-01-14 NOTE — Telephone Encounter (Signed)
Patient states dr. [name] wrote rx for nicotrol inhaler which was denied by insurance. Pt states Dr. [name] wrote a letter to insurance company and says it has denied again.  She is asking what Dr. Elissa Hefty next steps are.

## 2013-01-21 IMAGING — US US PELVIS COMPLETE
1 series · 14 of 25 positions shown · non-contrast
Comparison: 08/11/2007

CLINICAL DATA: Right lower quadrant pelvic pain.  Post menopausal.



[Series 1: us pelvis complete · 14 of 63 slices shown]
[im 1/63]
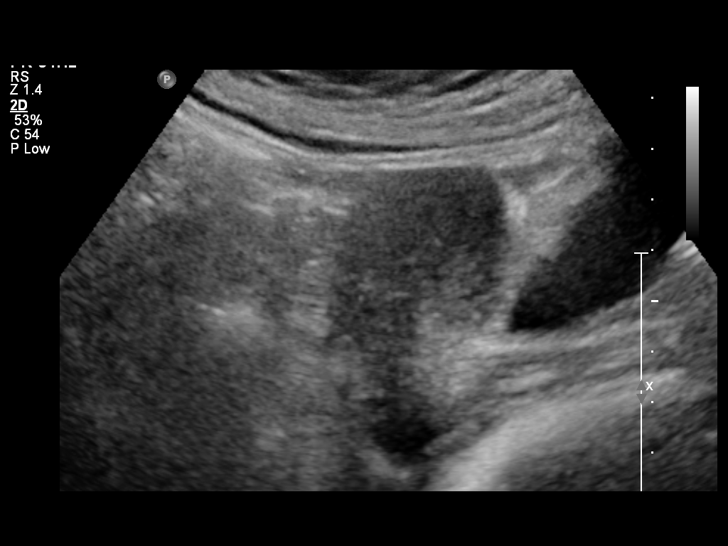
[im 6/63]
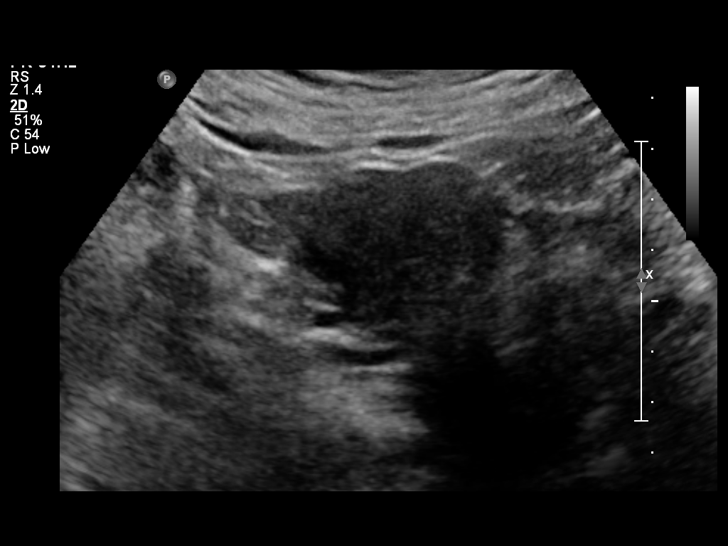
[im 11/63]
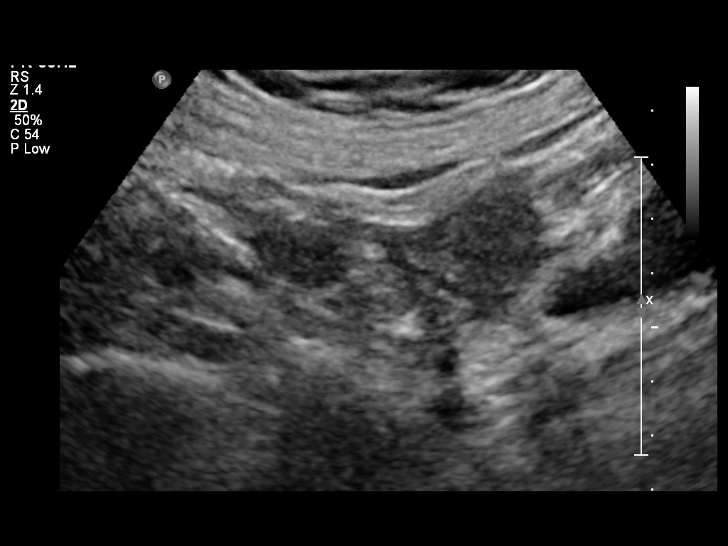
[im 16/63]
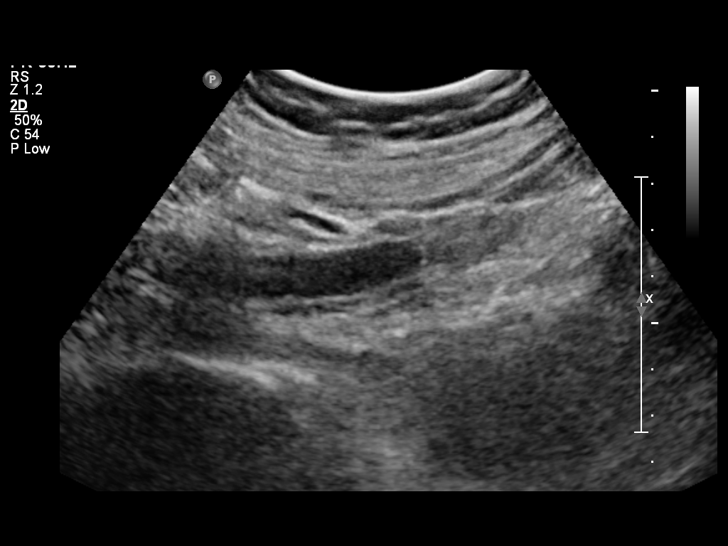
[im 21/63]
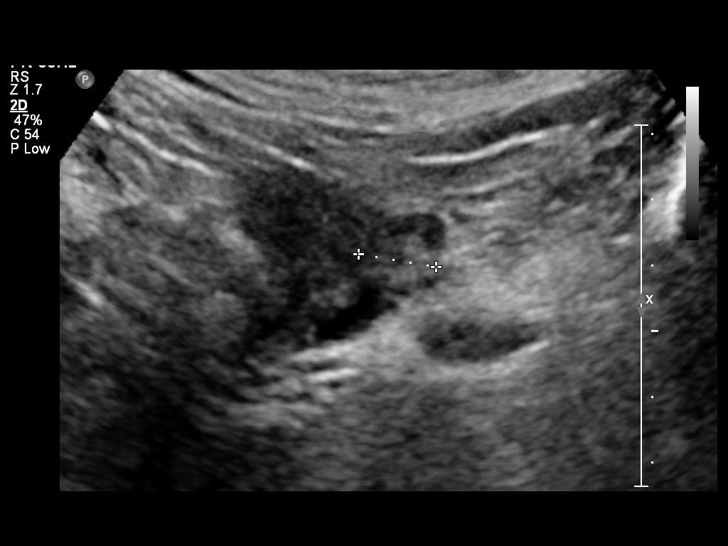
[im 24/63]
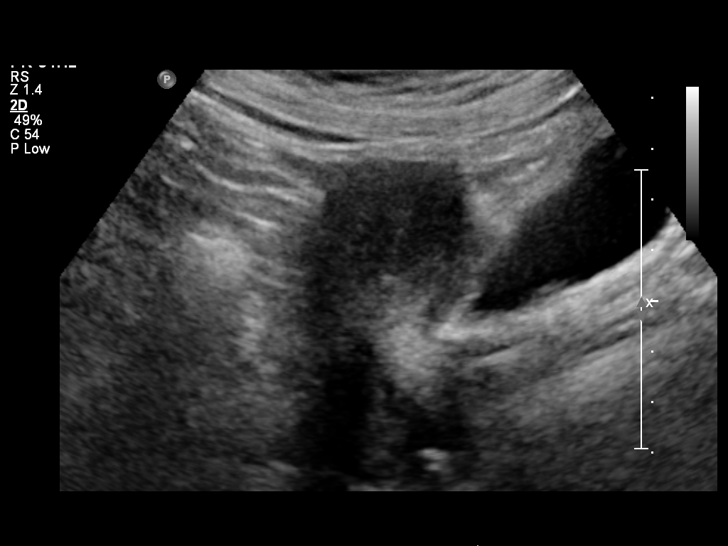
[im 29/63]
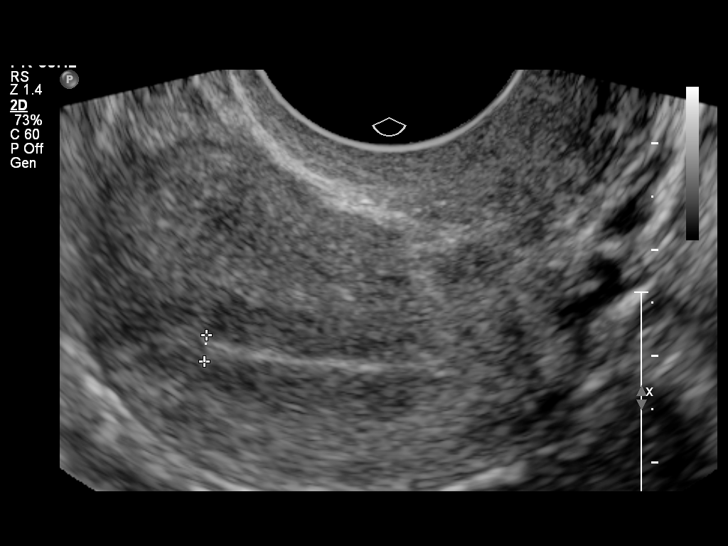
[im 34/63]
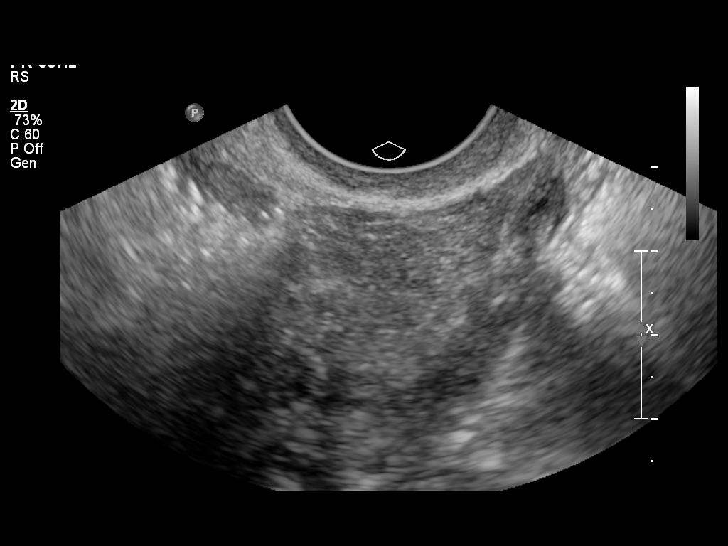
[im 39/63]
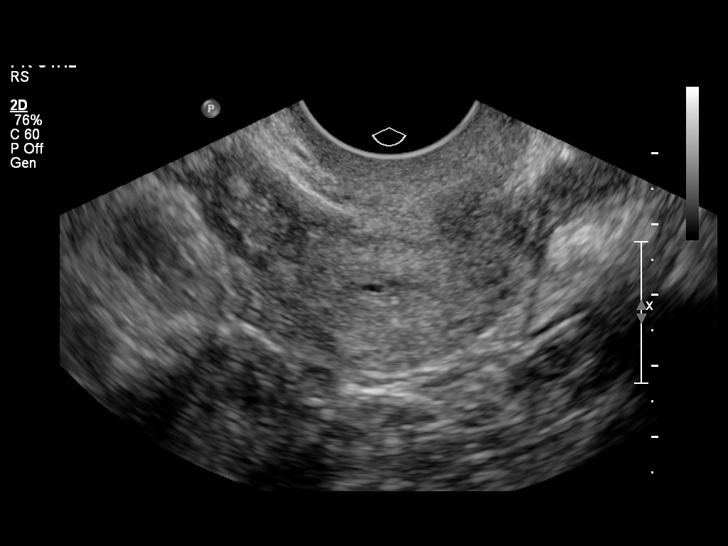
[im 42/63]
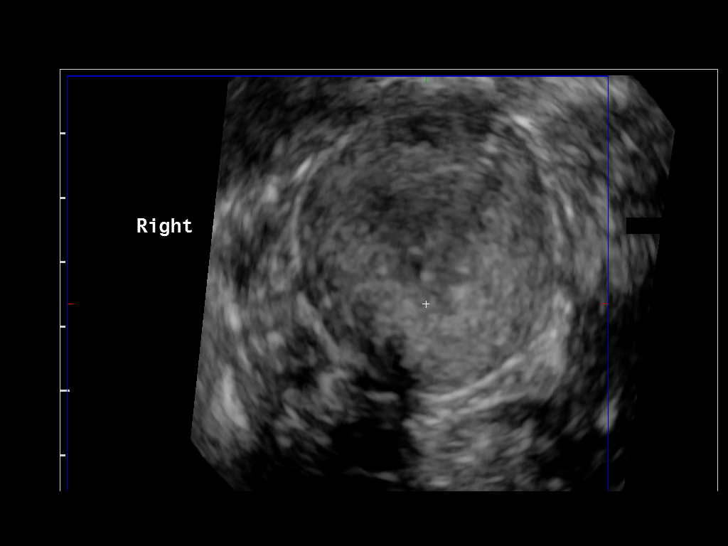
[im 47/63]
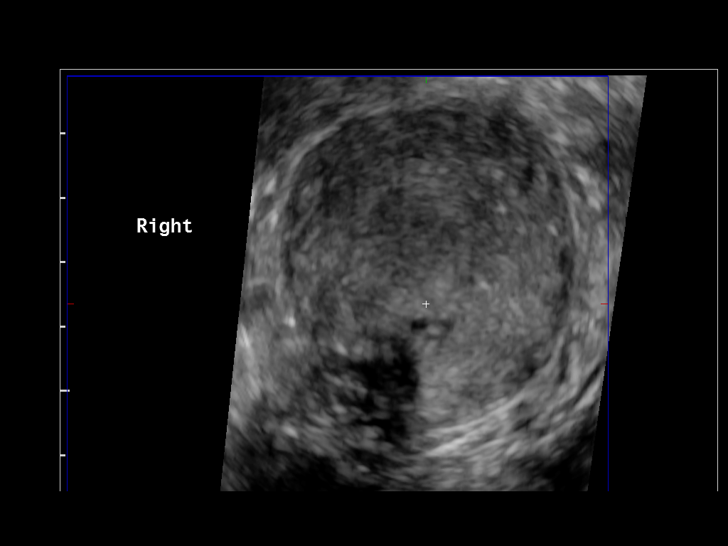
[im 52/63]
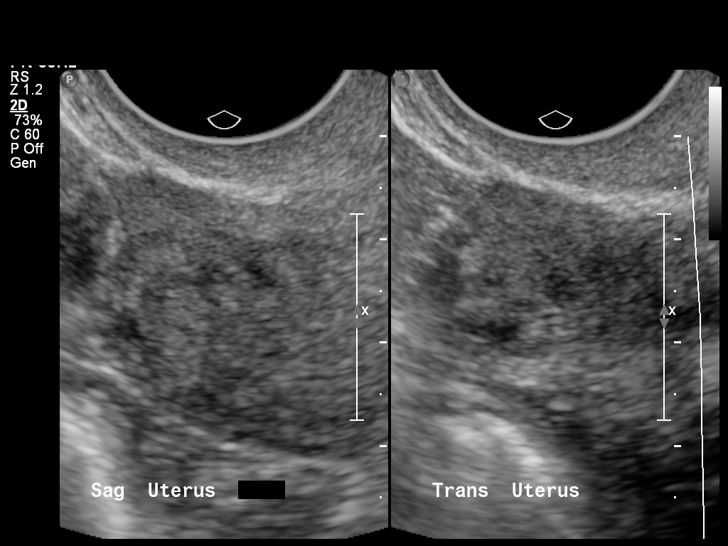
[im 57/63]
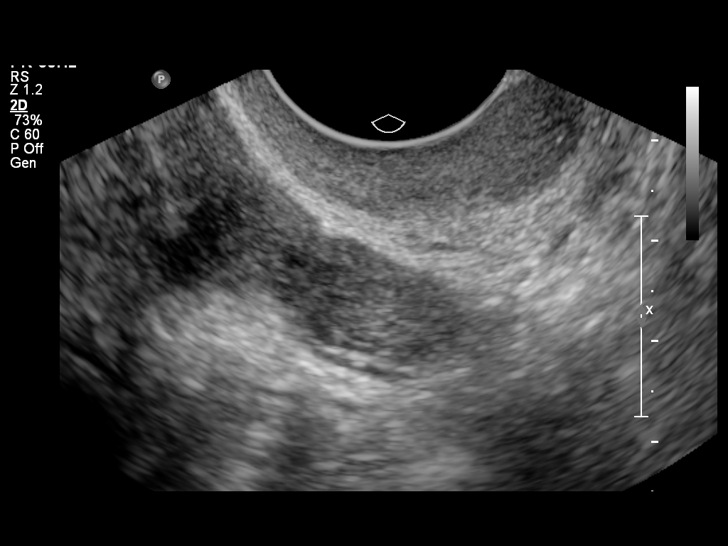
[im 63/63]
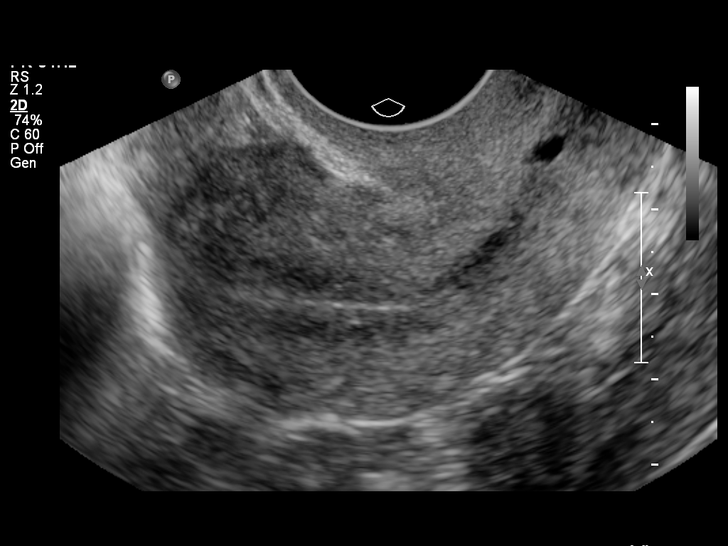

[14 of 25 positions shown; findings below may reference images not displayed]

FINDINGS: Uterus:  7.4 x 4.2 x 3.0 cm.  Inhomogeneous ill-defined area within
the left uterine fundus may represent an intramural fibroid without
mass effect upon the endometrium or subserosal component, measuring
overall 1.1 x 1.0 x 0.9 cm.

Endometrium:  3 mm.  Uniformly thin, and echogenic.

Right ovary:  1.9 x 1.3 x 1.3 cm.  Normal in appearance.  Seen
transabdominally only.

Left ovary:  1.9 x 1.3 x 1.2 cm.  Normal appearance/no adnexal
mass.

Other findings:  No free fluid.
IMPRESSION: Normal exam.  No specific etiology to explain the history of right
lower quadrant abdominal pain.

## 2013-01-22 DIAGNOSIS — L678 Other hair color and hair shaft abnormalities: Secondary | ICD-10-CM | POA: Diagnosis not present

## 2013-01-22 DIAGNOSIS — L738 Other specified follicular disorders: Secondary | ICD-10-CM | POA: Diagnosis not present

## 2013-01-22 DIAGNOSIS — H43399 Other vitreous opacities, unspecified eye: Secondary | ICD-10-CM | POA: Diagnosis not present

## 2013-01-22 DIAGNOSIS — M549 Dorsalgia, unspecified: Secondary | ICD-10-CM | POA: Diagnosis not present

## 2013-01-22 DIAGNOSIS — H11009 Unspecified pterygium of unspecified eye: Secondary | ICD-10-CM | POA: Diagnosis not present

## 2013-01-25 ENCOUNTER — Telehealth: Payer: Self-pay | Admitting: Cardiology

## 2013-01-25 IMAGING — CT CT ABD-PELV W/ CM
1 of 3 series · 14 of 32 positions shown, 19 images · IV contrast (agent unspecified)
Comparison: 09/24/2010

CLINICAL DATA: Right lower quadrant abdominal pain.

CT ABDOMEN AND PELVIS WITH CONTRAST
TECHNIQUE: Multidetector CT imaging of the abdomen and pelvis was
performed following the standard protocol during bolus
administration of intravenous contrast.
Contrast: 100 ml [id number]

[Series 2: rtn ap with st · axial · 0.75mm/px · z∈[-458,-62]mm · 14 of 89 slices shown, 19 images]
[im 5/89  soft-tissue]
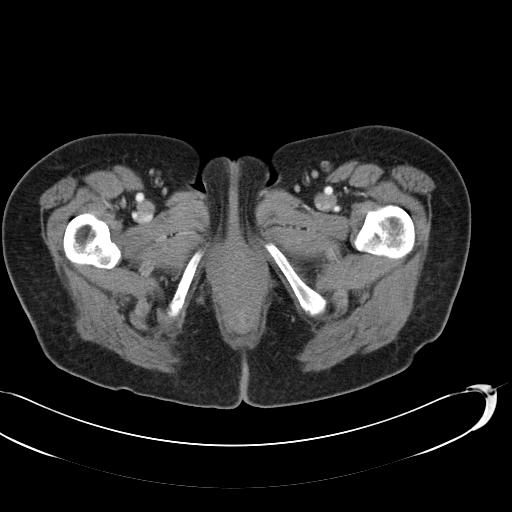
[im 5/89  bone]
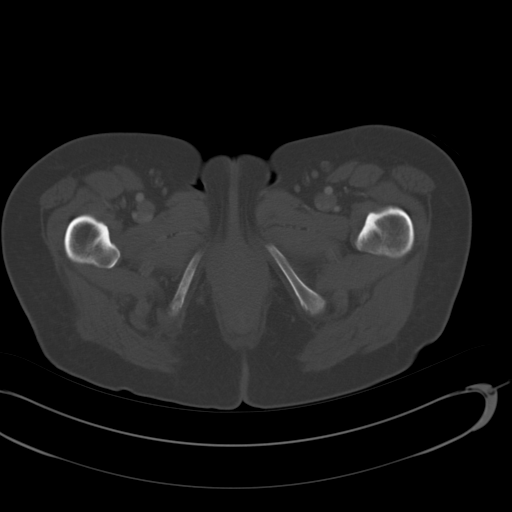
[im 14/89  soft-tissue]
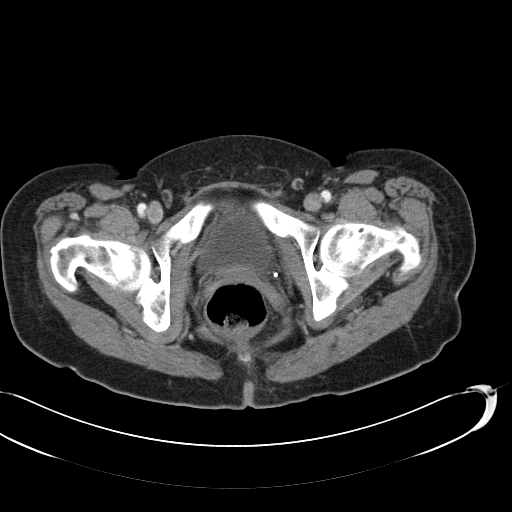
[im 19/89  soft-tissue]
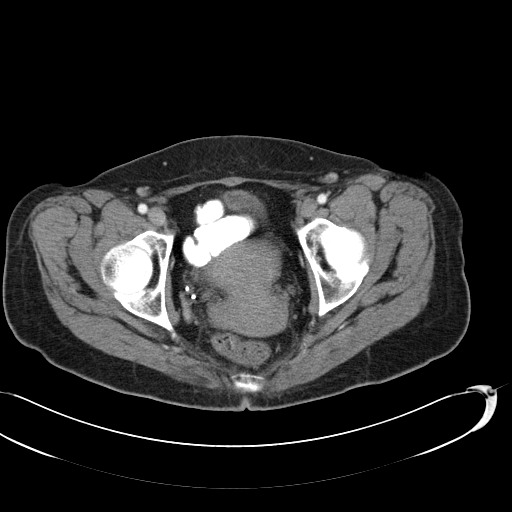
[im 24/89  soft-tissue]
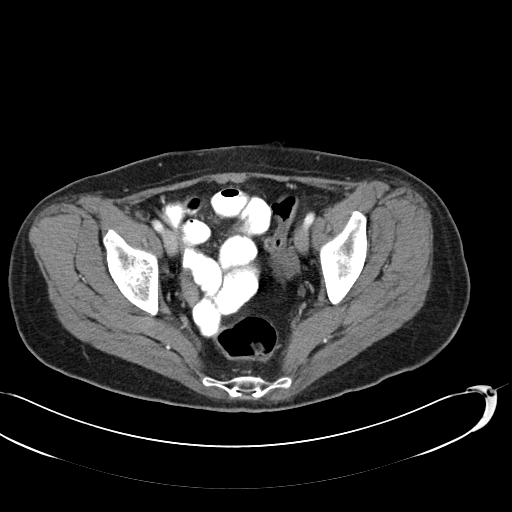
[im 33/89  soft-tissue]
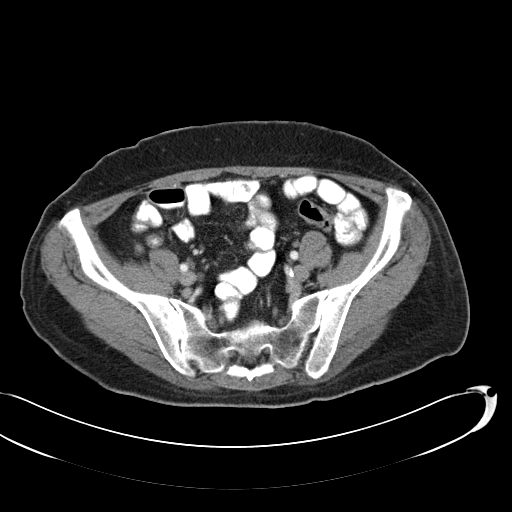
[im 38/89  soft-tissue]
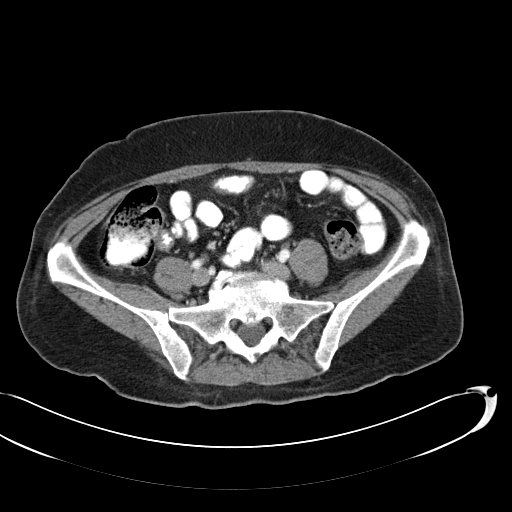
[im 47/89  soft-tissue]
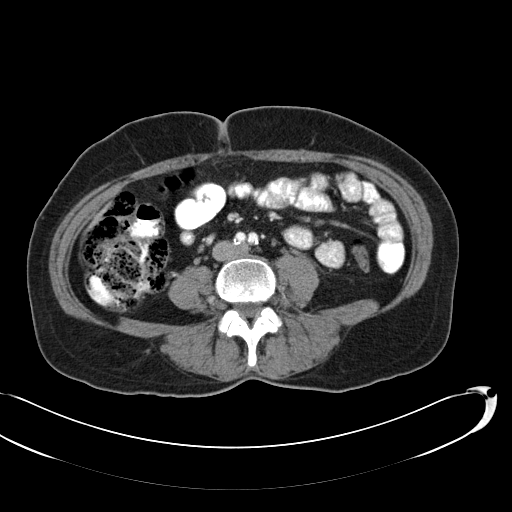
[im 51/89  soft-tissue]
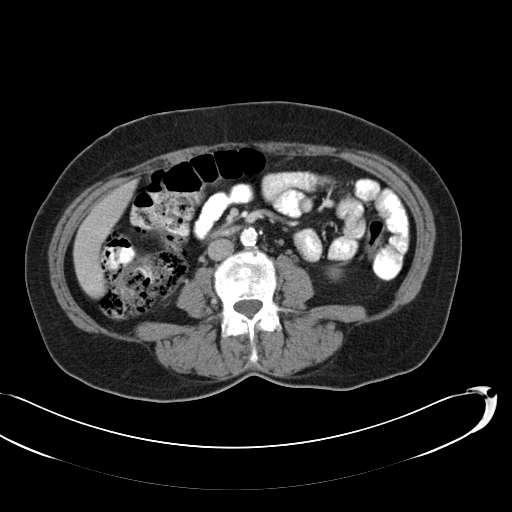
[im 56/89  soft-tissue]
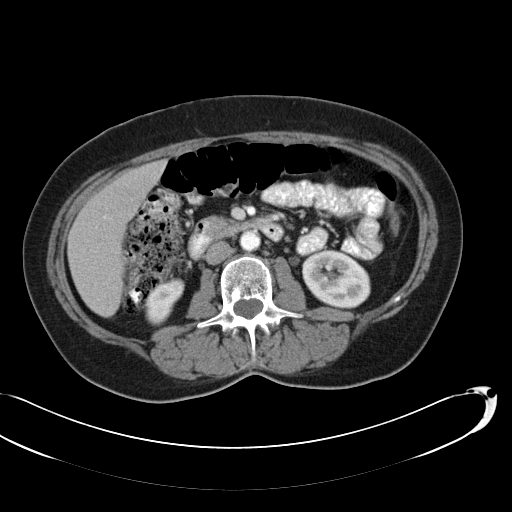
[im 56/89  bone]
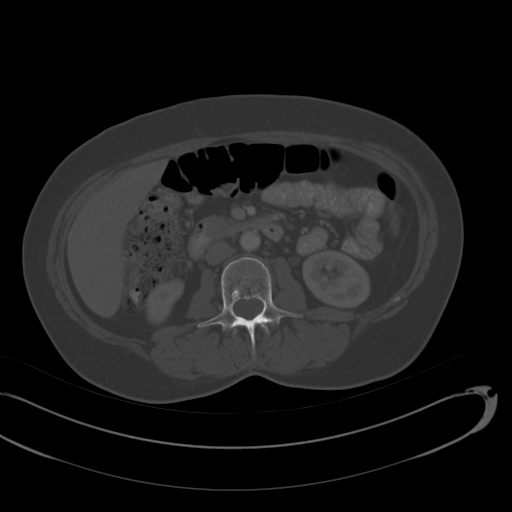
[im 65/89  soft-tissue]
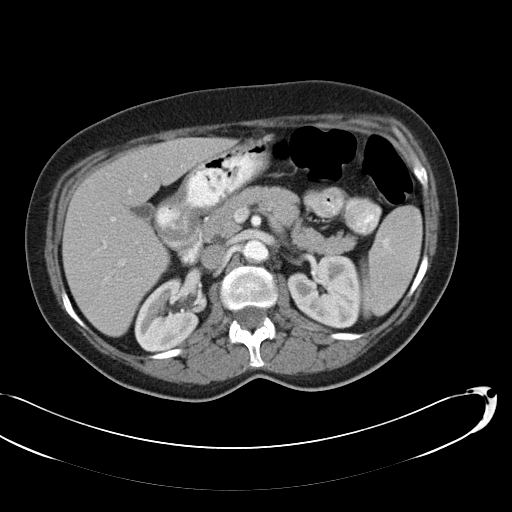
[im 70/89  soft-tissue]
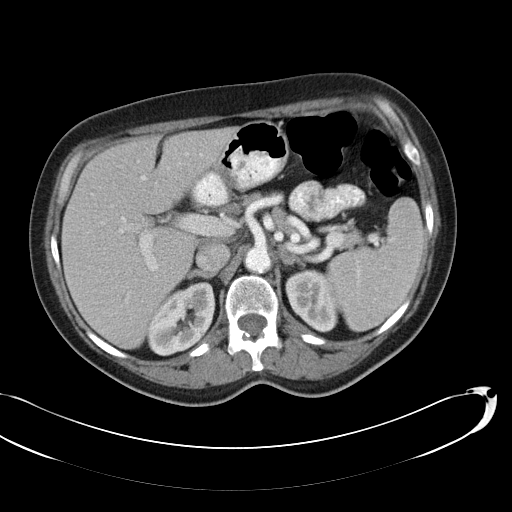
[im 70/89  lung]
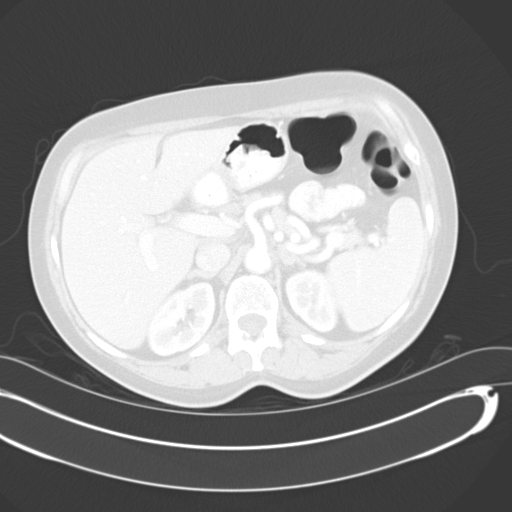
[im 75/89  soft-tissue]
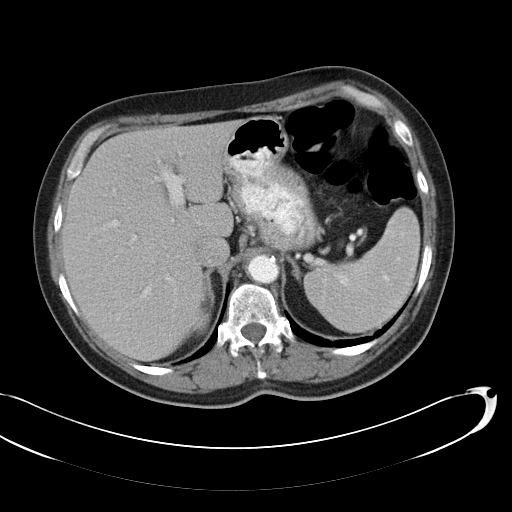
[im 75/89  lung]
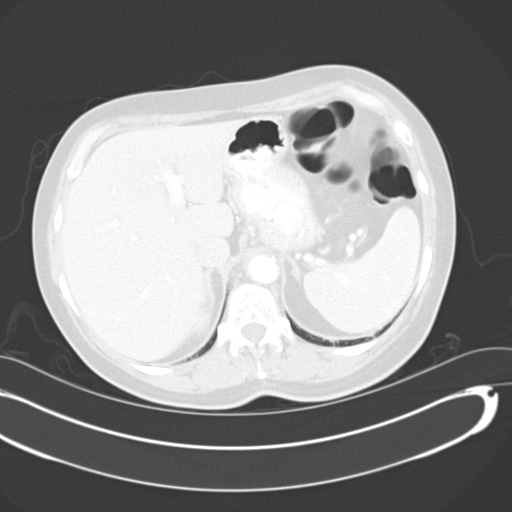
[im 79/89  lung]
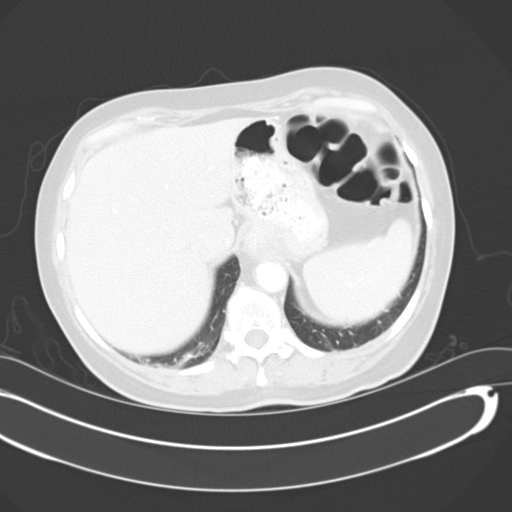
[im 84/89  soft-tissue]
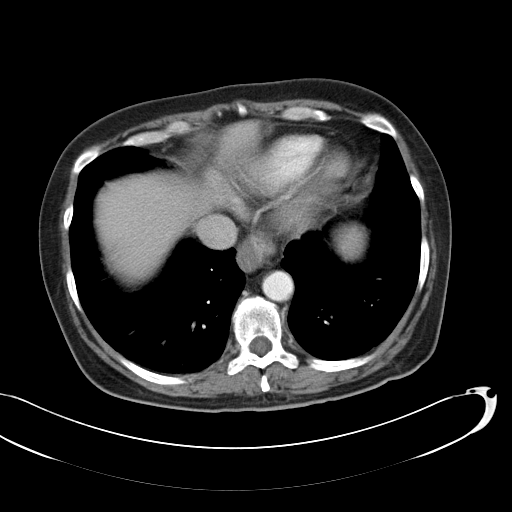
[im 84/89  lung]
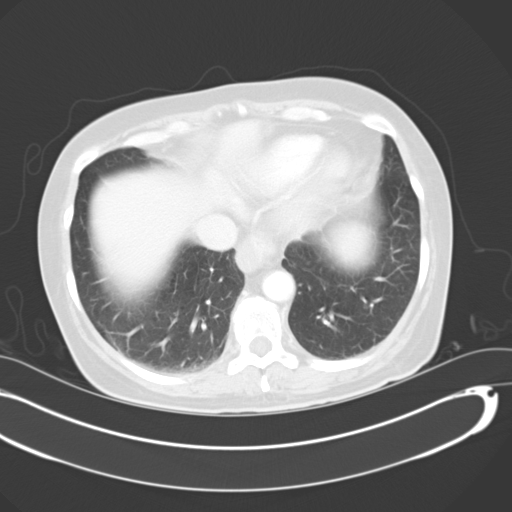

[14 of 32 positions shown; findings below may reference images not displayed]

FINDINGS: There is suspicion for emphysema in the lung bases.

A small type 1 hiatal hernia is present.

There is mild linear subsegmental atelectasis in the posterior
basal segments of both lower lobes.

The liver, spleen, pancreas, and adrenal glands appear
unremarkable.

The gallbladder and biliary system appear unremarkable.

Hypodense left mid kidney lesion on image 13 of series 5 is
statistically highly likely to represent a cyst, but technically
nonspecific.

The kidneys appear otherwise unremarkable.  No ureteral calculus
identified.

The terminal ileum appears normal.  The appendix appears normal.

The uterus and adnexa appear unremarkable.

Urinary bladder appears unremarkable.

No free pelvic fluid is identified.

No pathologic retroperitoneal or porta hepatis adenopathy is
identified.

No pathologic pelvic adenopathy is identified.

There is considerable sclerosis and some bony irregularity along
the pubic symphysis, suspicious for osteitis pubis.
IMPRESSION: 1.  A specific cause for right lower quadrant abdominal pain is not
identified.
2.  Small hiatal hernia.
3.  Suspected emphysema in the lung bases.

## 2013-01-25 NOTE — Telephone Encounter (Signed)
Message forwarded to S. Martin, RN.  

## 2013-01-25 NOTE — Telephone Encounter (Signed)
Need prior authorization on her Nicotrol inhaler please. Please call today if possible.

## 2013-01-27 ENCOUNTER — Telehealth: Payer: Self-pay | Admitting: *Deleted

## 2013-01-27 NOTE — Telephone Encounter (Signed)
Pt returned call. She states she went to pharmacy today.  The cost of nict. inhaler is @$200 after insurance. She states she can not afford it.  Informed patient, not sure if there is anything else concerning inhaler. Will defer to Dr [name].

## 2013-01-27 NOTE — Telephone Encounter (Signed)
Left message to call back.  Dr [name] replied. ?anything else.possible E-cigarettes, she may try.   Will give patient  Information  About smoking cessation program at  [facility]  Next class OCT 6 to Nov 17  ;for 8 sesions that last  1and1/2  Hours for 7 weeks

## 2013-01-27 NOTE — Telephone Encounter (Signed)
Called 01/27/2013 ,was informed by Alexia Freestone) representative that nicotrol inhaler has been approved from 8/25 to 06/02/2013.  Placed a call to [facility] pharmacy - pharmacist is aware of authorization. He states he will have to order the inhaler.It is not in stock.   Left a message on patient voicemail to call back

## 2013-01-27 NOTE — Telephone Encounter (Signed)
Not sure what else there is.  ? E-cigarrettes. HARDING,DAVID W

## 2013-02-04 DIAGNOSIS — H2510 Age-related nuclear cataract, unspecified eye: Secondary | ICD-10-CM | POA: Diagnosis not present

## 2013-02-04 DIAGNOSIS — H43399 Other vitreous opacities, unspecified eye: Secondary | ICD-10-CM | POA: Diagnosis not present

## 2013-02-04 DIAGNOSIS — H43819 Vitreous degeneration, unspecified eye: Secondary | ICD-10-CM | POA: Diagnosis not present

## 2013-02-04 DIAGNOSIS — H11159 Pinguecula, unspecified eye: Secondary | ICD-10-CM | POA: Diagnosis not present

## 2013-02-15 ENCOUNTER — Other Ambulatory Visit: Payer: Self-pay | Admitting: *Deleted

## 2013-02-15 MED ORDER — OMEPRAZOLE 20 MG PO CPDR: 20.0000 mg | DELAYED_RELEASE_CAPSULE | Freq: Two times a day (BID) | ORAL | Status: DC

## 2013-02-15 NOTE — Telephone Encounter (Signed)
Rx was sent to pharmacy electronically. 

## 2013-02-18 ENCOUNTER — Observation Stay (HOSPITAL_COMMUNITY)
Admission: EM | Admit: 2013-02-18 | Discharge: 2013-02-20 | Disposition: A | Discharge status: Home / Self Care | Payer: Medicare Other | Attending: Cardiovascular Disease | Admitting: Cardiovascular Disease

## 2013-02-18 ENCOUNTER — Encounter (HOSPITAL_COMMUNITY): Payer: Self-pay | Admitting: Emergency Medicine

## 2013-02-18 ENCOUNTER — Emergency Department (HOSPITAL_COMMUNITY): Payer: Medicare Other

## 2013-02-18 DIAGNOSIS — E785 Hyperlipidemia, unspecified: Secondary | ICD-10-CM | POA: Diagnosis not present

## 2013-02-18 DIAGNOSIS — J449 Chronic obstructive pulmonary disease, unspecified: Secondary | ICD-10-CM | POA: Diagnosis not present

## 2013-02-18 DIAGNOSIS — R0602 Shortness of breath: Secondary | ICD-10-CM | POA: Insufficient documentation

## 2013-02-18 DIAGNOSIS — J438 Other emphysema: Secondary | ICD-10-CM | POA: Diagnosis not present

## 2013-02-18 DIAGNOSIS — F411 Generalized anxiety disorder: Secondary | ICD-10-CM | POA: Insufficient documentation

## 2013-02-18 DIAGNOSIS — Z79899 Other long term (current) drug therapy: Secondary | ICD-10-CM | POA: Insufficient documentation

## 2013-02-18 DIAGNOSIS — F419 Anxiety disorder, unspecified: Secondary | ICD-10-CM | POA: Diagnosis present

## 2013-02-18 DIAGNOSIS — F17200 Nicotine dependence, unspecified, uncomplicated: Secondary | ICD-10-CM | POA: Diagnosis present

## 2013-02-18 DIAGNOSIS — I252 Old myocardial infarction: Secondary | ICD-10-CM | POA: Insufficient documentation

## 2013-02-18 DIAGNOSIS — G2581 Restless legs syndrome: Secondary | ICD-10-CM | POA: Diagnosis present

## 2013-02-18 DIAGNOSIS — I2510 Atherosclerotic heart disease of native coronary artery without angina pectoris: Secondary | ICD-10-CM | POA: Diagnosis not present

## 2013-02-18 DIAGNOSIS — I25119 Atherosclerotic heart disease of native coronary artery with unspecified angina pectoris: Secondary | ICD-10-CM

## 2013-02-18 DIAGNOSIS — J4489 Other specified chronic obstructive pulmonary disease: Secondary | ICD-10-CM | POA: Insufficient documentation

## 2013-02-18 DIAGNOSIS — J441 Chronic obstructive pulmonary disease with (acute) exacerbation: Secondary | ICD-10-CM | POA: Diagnosis present

## 2013-02-18 DIAGNOSIS — Z9861 Coronary angioplasty status: Secondary | ICD-10-CM | POA: Diagnosis not present

## 2013-02-18 DIAGNOSIS — K219 Gastro-esophageal reflux disease without esophagitis: Secondary | ICD-10-CM | POA: Diagnosis present

## 2013-02-18 DIAGNOSIS — Z720 Tobacco use: Secondary | ICD-10-CM

## 2013-02-18 DIAGNOSIS — R079 Chest pain, unspecified: Principal | ICD-10-CM | POA: Insufficient documentation

## 2013-02-18 LAB — BASIC METABOLIC PANEL
BUN: 13 mg/dL (ref 6–23)
CO2: 27 mEq/L (ref 19–32)
Calcium: 9.5 mg/dL (ref 8.4–10.5)
Chloride: 104 mEq/L (ref 96–112)
Creatinine, Ser: 0.58 mg/dL (ref 0.50–1.10)
GFR calc Af Amer: >90 mL/min (ref >90)
GFR calc non Af Amer: >90 mL/min (ref >90)
Glucose, Bld: 71 mg/dL (ref 70–99)
Potassium: 4.1 mEq/L (ref 3.5–5.1)
Sodium: 140 mEq/L (ref 135–145)

## 2013-02-18 LAB — CBC
HCT: 39.7 % (ref 36.0–46.0)
Hemoglobin: 14 g/dL (ref 12.0–15.0)
MCH: 30.6 pg (ref 26.0–34.0)
MCHC: 35.3 g/dL (ref 30.0–36.0)
MCV: 86.9 fL (ref 78.0–100.0)
Platelets: 195 10*3/uL (ref 150–400)
RBC: 4.57 MIL/uL (ref 3.87–5.11)
RDW: 13.1 % (ref 11.5–15.5)
WBC: 7.1 10*3/uL (ref 4.0–10.5)

## 2013-02-18 LAB — POCT I-STAT TROPONIN I: Troponin i, poc: 0 ng/mL (ref 0.00–0.08)

## 2013-02-18 MED ORDER — ONDANSETRON HCL 4 MG/2ML IJ SOLN
4.0000 mg | Freq: Once | INTRAMUSCULAR | Status: AC
  Administered 2013-02-19: 4 mg via INTRAVENOUS
  Filled 2013-02-18: qty 2

## 2013-02-18 MED ORDER — ASPIRIN 81 MG PO CHEW: 324.0000 mg | CHEWABLE_TABLET | Freq: Once | ORAL | Status: DC

## 2013-02-18 MED ORDER — MORPHINE SULFATE 4 MG/ML IJ SOLN
4.0000 mg | Freq: Once | INTRAMUSCULAR | Status: AC
  Administered 2013-02-19: 4 mg via INTRAVENOUS
  Filled 2013-02-18: qty 1

## 2013-02-18 MED ORDER — NITROGLYCERIN 0.4 MG SL SUBL: 0.4000 mg | SUBLINGUAL_TABLET | SUBLINGUAL | Status: DC | PRN

## 2013-02-18 NOTE — ED Notes (Signed)
Pt. reports intermittent  left chest pain with slight SOB and occasional dry cough / slight nausea for several days , pt. stated history of CAD with stent placement.
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
## 2013-02-18 NOTE — ED Notes (Signed)
Pt began having CP x1week ago and per pt she has hx of MI in 2013 and reports since then her stents have been occluded x2. Pt reports the pain feels similar to the last time it was occluded. Pt also reports having a h/a x1 week and states that the pain is in the top of her head and now tonight is in the occipital and top of her head. Pt states that the pain is intermittent, but when the pain in her head comes, "it feels like the top of my head is going to blow off." Pt is a/o x4 at this time. NAD

## 2013-02-19 ENCOUNTER — Encounter (HOSPITAL_COMMUNITY)
Admission: EM | Disposition: A | Discharge status: Nursing Home (Includes ICF) | Payer: Self-pay | Source: Home / Self Care | Attending: Emergency Medicine

## 2013-02-19 DIAGNOSIS — K219 Gastro-esophageal reflux disease without esophagitis: Secondary | ICD-10-CM

## 2013-02-19 DIAGNOSIS — I2510 Atherosclerotic heart disease of native coronary artery without angina pectoris: Secondary | ICD-10-CM | POA: Diagnosis not present

## 2013-02-19 DIAGNOSIS — J449 Chronic obstructive pulmonary disease, unspecified: Secondary | ICD-10-CM | POA: Diagnosis not present

## 2013-02-19 DIAGNOSIS — Z9861 Coronary angioplasty status: Secondary | ICD-10-CM | POA: Diagnosis not present

## 2013-02-19 DIAGNOSIS — F17200 Nicotine dependence, unspecified, uncomplicated: Secondary | ICD-10-CM

## 2013-02-19 DIAGNOSIS — R079 Chest pain, unspecified: Secondary | ICD-10-CM | POA: Diagnosis not present

## 2013-02-19 HISTORY — PX: LEFT HEART CATHETERIZATION WITH CORONARY ANGIOGRAM: SHX5451

## 2013-02-19 LAB — TROPONIN I
Troponin I: <0.3 ng/mL (ref <0.30)
Troponin I: <0.3 ng/mL (ref <0.30)
Troponin I: <0.3 ng/mL (ref <0.30)

## 2013-02-19 LAB — PRO B NATRIURETIC PEPTIDE: Pro B Natriuretic peptide (BNP): 80.3 pg/mL (ref 0–125)

## 2013-02-19 LAB — PROTIME-INR
INR: 1.09 (ref 0.00–1.49)
Prothrombin Time: 13.9 seconds (ref 11.6–15.2)

## 2013-02-19 SURGERY — LEFT HEART CATHETERIZATION WITH CORONARY ANGIOGRAM
Anesthesia: LOCAL

## 2013-02-19 MED ORDER — MIDAZOLAM HCL 2 MG/2ML IJ SOLN
INTRAMUSCULAR | Status: AC
  Filled 2013-02-19: qty 2

## 2013-02-19 MED ORDER — SODIUM CHLORIDE 0.9 % IV BOLUS (SEPSIS)
250.0000 mL | Freq: Once | INTRAVENOUS | Status: AC
  Administered 2013-02-19: 250 mL via INTRAVENOUS

## 2013-02-19 MED ORDER — ONDANSETRON HCL 4 MG/2ML IJ SOLN: 4.0000 mg | Freq: Four times a day (QID) | INTRAMUSCULAR | Status: DC | PRN

## 2013-02-19 MED ORDER — PRASUGREL HCL 10 MG PO TABS
10.0000 mg | ORAL_TABLET | Freq: Every day | ORAL | Status: DC
  Administered 2013-02-19 – 2013-02-20 (×2): 10 mg via ORAL
  Filled 2013-02-19 (×2): qty 1

## 2013-02-19 MED ORDER — GI COCKTAIL ~~LOC~~
30.0000 mL | Freq: Three times a day (TID) | ORAL | Status: DC | PRN
  Filled 2013-02-19: qty 30

## 2013-02-19 MED ORDER — GI COCKTAIL ~~LOC~~
30.0000 mL | Freq: Once | ORAL | Status: AC
  Administered 2013-02-19: 30 mL via ORAL
  Filled 2013-02-19: qty 30

## 2013-02-19 MED ORDER — OMEGA-3 FATTY ACIDS 1000 MG PO CAPS: 2.0000 g | ORAL_CAPSULE | Freq: Every day | ORAL | Status: DC

## 2013-02-19 MED ORDER — HEPARIN SODIUM (PORCINE) 5000 UNIT/ML IJ SOLN
5000.0000 [IU] | Freq: Three times a day (TID) | INTRAMUSCULAR | Status: DC
  Filled 2013-02-19 (×5): qty 1

## 2013-02-19 MED ORDER — NITROGLYCERIN 0.4 MG SL SUBL: 0.4000 mg | SUBLINGUAL_TABLET | SUBLINGUAL | Status: DC | PRN

## 2013-02-19 MED ORDER — DIAZEPAM 5 MG PO TABS
5.0000 mg | ORAL_TABLET | ORAL | Status: DC
  Filled 2013-02-19: qty 1

## 2013-02-19 MED ORDER — FENTANYL CITRATE 0.05 MG/ML IJ SOLN
INTRAMUSCULAR | Status: AC
  Filled 2013-02-19: qty 2

## 2013-02-19 MED ORDER — SIMVASTATIN 10 MG PO TABS: 10.0000 mg | ORAL_TABLET | Freq: Every day | ORAL | Status: DC

## 2013-02-19 MED ORDER — AMLODIPINE BESYLATE 2.5 MG PO TABS
2.5000 mg | ORAL_TABLET | Freq: Every day | ORAL | Status: DC
  Filled 2013-02-19 (×2): qty 1

## 2013-02-19 MED ORDER — LIDOCAINE HCL (PF) 1 % IJ SOLN
INTRAMUSCULAR | Status: AC
  Filled 2013-02-19: qty 30

## 2013-02-19 MED ORDER — ACETAMINOPHEN 325 MG PO TABS
650.0000 mg | ORAL_TABLET | Freq: Four times a day (QID) | ORAL | Status: DC | PRN
  Administered 2013-02-20: 650 mg via ORAL
  Filled 2013-02-19: qty 2

## 2013-02-19 MED ORDER — PRAVASTATIN SODIUM 20 MG PO TABS
20.0000 mg | ORAL_TABLET | Freq: Every day | ORAL | Status: DC
  Administered 2013-02-19: 20 mg via ORAL
  Filled 2013-02-19 (×2): qty 1

## 2013-02-19 MED ORDER — SODIUM CHLORIDE 0.9 % IJ SOLN: 3.0000 mL | Freq: Two times a day (BID) | INTRAMUSCULAR | Status: DC

## 2013-02-19 MED ORDER — OMEGA-3-ACID ETHYL ESTERS 1 G PO CAPS
2.0000 g | ORAL_CAPSULE | Freq: Every day | ORAL | Status: DC
  Filled 2013-02-19 (×2): qty 2

## 2013-02-19 MED ORDER — METOPROLOL SUCCINATE ER 25 MG PO TB24
25.0000 mg | ORAL_TABLET | Freq: Every day | ORAL | Status: DC
  Filled 2013-02-19 (×2): qty 1

## 2013-02-19 MED ORDER — ASPIRIN 81 MG PO CHEW
162.0000 mg | CHEWABLE_TABLET | Freq: Once | ORAL | Status: AC
  Administered 2013-02-19: 81 mg via ORAL
  Filled 2013-02-19: qty 2

## 2013-02-19 MED ORDER — ASPIRIN EC 81 MG PO TBEC
81.0000 mg | DELAYED_RELEASE_TABLET | Freq: Every day | ORAL | Status: DC
  Filled 2013-02-19 (×2): qty 1

## 2013-02-19 MED ORDER — ACETAMINOPHEN 325 MG PO TABS: 650.0000 mg | ORAL_TABLET | ORAL | Status: DC | PRN

## 2013-02-19 MED ORDER — HYDROMORPHONE HCL PF 1 MG/ML IJ SOLN
1.0000 mg | Freq: Once | INTRAMUSCULAR | Status: AC
  Administered 2013-02-19: 1 mg via INTRAVENOUS
  Filled 2013-02-19: qty 1

## 2013-02-19 MED ORDER — PANTOPRAZOLE SODIUM 40 MG PO TBEC
40.0000 mg | DELAYED_RELEASE_TABLET | Freq: Every day | ORAL | Status: DC
  Administered 2013-02-19: 40 mg via ORAL
  Filled 2013-02-19: qty 1

## 2013-02-19 MED ORDER — COQ10 100 MG PO CAPS: 1.0000 | ORAL_CAPSULE | Freq: Every day | ORAL | Status: DC

## 2013-02-19 MED ORDER — ISOSORBIDE MONONITRATE ER 30 MG PO TB24
30.0000 mg | ORAL_TABLET | Freq: Every day | ORAL | Status: DC
  Filled 2013-02-19 (×2): qty 1

## 2013-02-19 MED ORDER — PANTOPRAZOLE SODIUM 40 MG PO TBEC
40.0000 mg | DELAYED_RELEASE_TABLET | Freq: Two times a day (BID) | ORAL | Status: DC
  Administered 2013-02-19: 40 mg via ORAL
  Filled 2013-02-19: qty 1

## 2013-02-19 MED ORDER — SODIUM CHLORIDE 0.9 % IV SOLN
INTRAVENOUS | Status: DC
  Administered 2013-02-19: 11:00:00 via INTRAVENOUS

## 2013-02-19 MED ORDER — TIOTROPIUM BROMIDE MONOHYDRATE 18 MCG IN CAPS
18.0000 ug | ORAL_CAPSULE | Freq: Every day | RESPIRATORY_TRACT | Status: DC
  Filled 2013-02-19: qty 5

## 2013-02-19 MED ORDER — CLONAZEPAM 1 MG PO TABS: 2.0000 mg | ORAL_TABLET | Freq: Three times a day (TID) | ORAL | Status: DC | PRN

## 2013-02-19 MED ORDER — MORPHINE SULFATE 4 MG/ML IJ SOLN
4.0000 mg | INTRAMUSCULAR | Status: DC | PRN
  Administered 2013-02-19: 4 mg via INTRAVENOUS
  Filled 2013-02-19: qty 1

## 2013-02-19 MED ORDER — SODIUM CHLORIDE 0.9 % IV SOLN
INTRAVENOUS | Status: DC
  Administered 2013-02-20: 04:00:00 via INTRAVENOUS

## 2013-02-19 MED ORDER — NICOTINE 10 MG IN INHA: 1.0000 | RESPIRATORY_TRACT | Status: DC | PRN

## 2013-02-19 MED ORDER — HEPARIN (PORCINE) IN NACL 2-0.9 UNIT/ML-% IJ SOLN
INTRAMUSCULAR | Status: AC
  Filled 2013-02-19: qty 1500

## 2013-02-19 MED ORDER — HEPARIN SODIUM (PORCINE) 5000 UNIT/ML IJ SOLN
5000.0000 [IU] | Freq: Three times a day (TID) | INTRAMUSCULAR | Status: DC
  Administered 2013-02-20: 5000 [IU] via SUBCUTANEOUS
  Filled 2013-02-19 (×4): qty 1

## 2013-02-19 MED ORDER — NITROGLYCERIN 0.2 MG/ML ON CALL CATH LAB
INTRAVENOUS | Status: AC
  Filled 2013-02-19: qty 1

## 2013-02-19 NOTE — Interval H&P Note (Signed)
Cath Lab Visit (complete for each Cath Lab visit)  Clinical Evaluation Leading to the Procedure:   ACS: no  Non-ACS:    Anginal Classification: CCS III  Anti-ischemic medical therapy: Maximal Therapy (2 or more classes of medications)  Non-Invasive Test Results: No non-invasive testing performed  Prior CABG: No previous CABG      History and Physical Interval Note:  02/19/2013 1:10 PM  Michele [name]  has presented today for surgery, with the diagnosis of cp  The various methods of treatment have been discussed with the patient and family. After consideration of risks, benefits and other options for treatment, the patient has consented to  Procedure(s): LEFT HEART CATHETERIZATION WITH CORONARY ANGIOGRAM (N/A) as a surgical intervention .  The patient's history has been reviewed, patient examined, no change in status, stable for surgery.  I have reviewed the patient's chart and labs.  Questions were answered to the patient's satisfaction.     KELLY,THOMAS A
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
## 2013-02-19 NOTE — Progress Notes (Signed)
PHARMACIST - PHYSICIAN ORDER COMMUNICATION  CONCERNING: P&T Medication Policy on Herbal Medications  DESCRIPTION:  This patient's order for:  CoQ10  has been noted.  This product(s) is classified as an "herbal" or natural product. Due to a lack of definitive safety studies or FDA approval, nonstandard manufacturing practices, plus the potential risk of unknown drug-drug interactions while on inpatient medications, the Pharmacy and Therapeutics Committee does not permit the use of "herbal" or natural products of this type within [facility].   ACTION TAKEN: The pharmacy department is unable to verify this order at this time and your patient has been informed of this safety policy. Please reevaluate patient's clinical condition at discharge and address if the herbal or natural product(s) should be resumed at that time.  Michele [name]

## 2013-02-19 NOTE — ED Notes (Signed)
Pt began having more intense CP after getting morphine. RN shot another EKG while pt was having pain and gave to EKG.

## 2013-02-19 NOTE — ED Notes (Signed)
RN verified pt's allergies and pt has allergy to asa, but takes 81mg  asa daily. Pt stated, "I just can't take a full 325mg  dose, so I take a baby aspirin every day." RN notified MD, Per MD, discontinue 324mg  asa, and give 162mg  asa.

## 2013-02-19 NOTE — Care Management Note (Unsigned)
    Page 1 of 1   02/19/2013     4:48:34 PM   CARE MANAGEMENT NOTE 02/19/2013  Patient:  Michele [name], Michele [name]   Account Number:  [id number]  Date Initiated:  02/19/2013  Documentation initiated by:  Carlin Attridge  Subjective/Objective Assessment:   PT ADM ON 02/18/13 WITH CHEST PAIN, SOB, COUGH.  PTA, PT INDEPENDENT OF ADLS.     Action/Plan:   WILL FOLLOW FOR DISCHARGE NEEDS AS PT PROGRESSES.   Anticipated DC Date:  02/20/2013   Anticipated DC Plan:  HOME/SELF CARE      DC Planning Services  CM consult      Choice offered to / List presented to:             Status of service:  In process, will continue to follow Medicare Important Message given?   (If response is "NO", the following Medicare IM given date fields will be blank) Date Medicare IM given:   Date Additional Medicare IM given:    Discharge Disposition:    Per UR Regulation:  Reviewed for med. necessity/level of care/duration of stay  If discussed at Long Length of Stay Meetings, dates discussed:    Comments:
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
## 2013-02-19 NOTE — CV Procedure (Signed)
Michele [name] is a 57 y.o. female    [id number]  [id number] LOCATION:  FACILITY: [facility]  PHYSICIAN: Lennette Bihari, MD, [facility] [birth year]   DATE OF PROCEDURE:  02/19/2013    [facility]  CARDIAC CATHETERIZATION     HISTORY:  Ms. Michele [name] is a 57 year old female who has a long-standing history of ongoing tobacco use. In May 2013 she suffered an inferolateral myocardial infarction and underwent stenting of her mid AV groove circumflex extending into the OM 3 vessel with 2.5 x12 and 2.5 x 8 overlapping promise DES stents. One month later she underwent staged PTCA of her mid right carotid artery with insertion of a 2.5x24 mm promise element DES stent. She was found to have restenosis in June 2014 to her circumflex at the distal stents stent site and underwent cutting balloon PTCA and also insertion of a new stent into the groove circumflex resulting in bifurcation stents in the OM 3 and AV groove circumflex beyond the OM 3 vessel. The patient has continued to smoke cigarettes. She is developed recurrent chest pain which he states is similar to her June discomfort. Of note, she does have a history of previously documented  coronary vasospasm which did improve with nitroglycerin. She was admitted to [facility] during the night and in light of her recurrent symptoms definitive repeat cardiac catheterization is recommended.   PROCEDURE:  The patient was brought to the second floor [facility] cath lab in the postabsorptive state. She was  premedicated with Versed for a total dose of 3 mg and fentanyl for a total dose of 75 mcg.  Her right groin was prepped and shaved in usual sterile fashion. Xylocaine 1% was used for local anesthesia. A 5 French sheath was inserted into the right femoral artery. Diagnostic catheterizatiion was done with 5 [place] LF4, FR4, and pigtail catheters. Due to coronary vasospasm involving the proximal right coronary artery, she also  received 200 mcg of intracoronary nitroglycerin selectively into the RCA. Left ventriculography was done with 24 cc Omnipaque contrast. Hemostasis was obtained by direct manual compression. The patient tolerated the procedure well.   HEMODYNAMICS:   Central Aorta: 92/51   Left Ventricle: 92/7  ANGIOGRAPHY:  1. Left main: Normal and bifurcated into a large LAD and left circumflex vessel.  2. LAD: Appeared angiographically normal. The midportion of the LAD seem to dip into myocardial leak. No spasm was noted today in contrast to the patient's prior cardiac catheterization in the mid LAD segment. 3. Left circumflex: The stent in the mid AV groove circumflex between the OM2 and OM 3 vessel was patent and the bifurcation stents in the OM3 and also in the AV groove circumflex beyond the OM3 vessel were patent. There is mild smooth 20% narrowing at the ostium of the OM3 which was not felt to be significant. There was TIMI-3 flow  4. Right coronary artery: Had a widely patent stent in the midportion. There was mild 30% narrowing in the very proximal RCA just beyond the catheter tip with subsequent catheter-induced additional vasospasm. This did resolve with IC nitroglycerin administration. There was no change in the previously noted 30% distal RCA narrowing in the region of the acute margin. 2 small marginal branches arose within the stented segment. The second marginal branch was diminutive size vessel with jailed ostial narrowing of  70% in this is very small diminutive twig-like branch.   5.  Left ventriculography revealed normal LV contractility without focal segmental wall  motion abnormalities. Estimated ejection fraction was at least 55-60%.  IMPRESSION:  Normal LV function with ejection fraction of 55-60%. Mild coronary artery disease with normal LAD system. Patent stents in the mid left circumflex coronary artery extending into bifurcation stents in the OM3 and AV groove with smooth 20% ostial  narrowing within the stented segment in the OM 3 vessel with brisk TIMI-3 flow.  Patent mid right carotid artery stent with proximal RCA narrowing of 30% associated with coronary vasospasm which did improve with IC nitroglycerin and no change in the previously noted 30% distal RCA narrowing in the region of the acute margin proximal to the PDA takeoff.   RECOMMENDATION:  Medical therapy including low-dose nitrates or amlodipine with documentation of coronary vasospasm. Smoking cessation is imperative.   Lennette Bihari, MD, [facility] 02/19/2013 2:17 PM

## 2013-02-19 NOTE — Progress Notes (Signed)
Utilization Review Completed.Michele [name] T9/19/2014  

## 2013-02-20 DIAGNOSIS — K219 Gastro-esophageal reflux disease without esophagitis: Secondary | ICD-10-CM | POA: Diagnosis not present

## 2013-02-20 DIAGNOSIS — F17200 Nicotine dependence, unspecified, uncomplicated: Secondary | ICD-10-CM | POA: Diagnosis not present

## 2013-02-20 DIAGNOSIS — R079 Chest pain, unspecified: Secondary | ICD-10-CM | POA: Diagnosis not present

## 2013-02-20 MED ORDER — OMEGA-3-ACID ETHYL ESTERS 1 G PO CAPS: 2.0000 g | ORAL_CAPSULE | Freq: Every day | ORAL | Status: DC

## 2013-02-20 MED ORDER — SALINE SPRAY 0.65 % NA SOLN
1.0000 | NASAL | Status: DC | PRN
  Administered 2013-02-20: 1 via NASAL
  Filled 2013-02-20: qty 44

## 2013-02-20 NOTE — Discharge Summary (Signed)
Physician Discharge Summary  Patient ID: Michele [name] MRN: [id number] DOB/AGE: [birth year] 57 y.o.  Admit date: 02/18/2013 Discharge date: 02/20/2013  Admission Diagnoses: Chest pain with moderate risk for acute  Discharge Diagnoses:  Principal Problem:   Chest pain with moderate risk of acute coronary syndrome - mild nonobstructive CAD on cath 02/19/13 Active Problems:   DYSLIPIDEMIA   ANXIETY   NICOTINE ADDICTION - Tobacco Abuse, long standing   RESTLESS LEG SYNDROME   COPD   GERD   CAD -S/P MI-PCI AVG 5/13 then staged DES to RCA  11/06/11. ISR- PCI 11/19/12   Discharged Condition: stable  Hospital Course: Michele [name] is a 57 year old female, followed by Dr. [name], who has a long-standing history of ongoing tobacco use. In May 2013 she suffered an inferolateral myocardial infarction and underwent stenting of her mid AV groove circumflex extending into the OM 3 vessel with 2.5 x12 and 2.5 x 8 overlapping promise DES stents. One month later she underwent staged PTCA of her mid right carotid artery with insertion of a 2.5x24 mm promise element DES stent. She was found to have restenosis in June 2014 to her circumflex at the distal stents stent site and underwent cutting balloon PTCA and also insertion of a new stent into the groove circumflex resulting in bifurcation stents in the OM 3 and AV groove circumflex beyond the OM 3 vessel. The patient has continued to smoke cigarettes. She presented to the [facility] ER on 02/19/13 for evaluation of recurrent CP. She stated that the pain was very similar to her June discomfort. Of note, she does have a history of previously documented coronary vasospasm which did improve with nitroglycerin. However, it was decided to admit for a diagnostic LHC. The procedure was performed by Dr. Tresa Endo, via the right femoral artery. She was found to have mild CAD with a normal LAD system. All of her previously placed stents were patent. She had normal LV function with an  EF of 55-60%.  No PCI was needed. She left the cath lab in stable condition. Post-cath recovery was complicated by hypotension. She responded to IVF and her blood pressure improved. She had no additional complications and her pain subsided. The right femoral access site remained stable. She had no pain with ambulation. She was last seen and examined by Dr. Tresa Endo, who determined that she was stable for discharge home. She was resumed on all of her previously prescribed medications. She was strongly encourage to discontinue smoking. The patient will follow-up with Dr. [name] at [facility].    Consults: None  Significant Diagnostic Studies:   LHC 02/19/13 HEMODYNAMICS:  Central Aorta: 92/51  Left Ventricle: 92/7  ANGIOGRAPHY:  1. Left main: Normal and bifurcated into a large LAD and left circumflex vessel.  2. LAD: Appeared angiographically normal. The midportion of the LAD seem to dip into myocardial leak. No spasm was noted today in contrast to the patient's prior cardiac catheterization in the mid LAD segment.  3. Left circumflex: The stent in the mid AV groove circumflex between the OM2 and OM 3 vessel was patent and the bifurcation stents in the OM3 and also in the AV groove circumflex beyond the OM3 vessel were patent. There is mild smooth 20% narrowing at the ostium of the OM3 which was not felt to be significant. There was TIMI-3 flow  4. Right coronary artery: Had a widely patent stent in the midportion. There was mild 30% narrowing in the very proximal RCA just beyond the catheter tip  with subsequent catheter-induced additional vasospasm. This did resolve with IC nitroglycerin administration. There was no change in the previously noted 30% distal RCA narrowing in the region of the acute margin. 2 small marginal branches arose within the stented segment. The second marginal branch was diminutive size vessel with jailed ostial narrowing of 70% in this is very small diminutive twig-like branch.  5.  Left ventriculography revealed normal LV contractility without focal segmental wall motion abnormalities. Estimated ejection fraction was at least 55-60%.  IMPRESSION:  Normal LV function with ejection fraction of 55-60%.  Mild coronary artery disease with normal LAD system.  Patent stents in the mid left circumflex coronary artery extending into bifurcation stents in the OM3 and AV groove with smooth 20% ostial narrowing within the stented segment in the OM 3 vessel with brisk TIMI-3 flow.  Patent mid right carotid artery stent with proximal RCA narrowing of 30% associated with coronary vasospasm which did improve with IC nitroglycerin and no change in the previously noted 30% distal RCA narrowing in the region of the acute margin proximal to the PDA takeoff.  RECOMMENDATION:  Medical therapy including low-dose nitrates or amlodipine with documentation of coronary vasospasm. Smoking cessation is imperative.   Treatments: See Hospital Course  Discharge Exam: Blood pressure 133/61, pulse 59, temperature 97.4 F (36.3 C), temperature source Oral, resp. rate 20, height 5' 5.5" (1.664 m), weight 158 lb 9.6 oz (71.94 kg), SpO2 97.00%.   Disposition: 01-Home or Self Care  Discharge Orders   Future Appointments Provider Department Dept Phone   03/02/2013 3:00 PM Abelino Derrick, [place] [facility] [place] [phone]   Future Orders Complete By Expires   Diet - low sodium heart healthy  As directed    Driving Restrictions  As directed    Comments:     No driving for 2 days   Increase activity slowly  As directed    Lifting restrictions  As directed    Comments:     No driving lifting more than 1/2 gallon of milk for 2 days       Medication List         acetaminophen 325 MG tablet  Commonly known as:  TYLENOL  Take 2 tablets (650 mg total) by mouth every 6 (six) hours as needed.     amLODipine 2.5 MG tablet  Commonly known as:  NORVASC  Take 1 tablet (2.5  mg total) by mouth daily.     aspirin 81 MG EC tablet  Take 81 mg by mouth daily. Swallow whole.     CALCIUM 600+D PO  Take 1 tablet by mouth 2 (two) times daily.     clonazePAM 2 MG tablet  Commonly known as:  KLONOPIN  Take 2 mg by mouth 3 (three) times daily as needed for anxiety.     CoQ10 100 MG Caps  Take 1 tablet by mouth daily.     fish oil-omega-3 fatty acids 1000 MG capsule  Take 2 g by mouth daily.     isosorbide mononitrate 30 MG 24 hr tablet  Commonly known as:  IMDUR  Take 30 mg by mouth daily.     metoprolol succinate 25 MG 24 hr tablet  Commonly known as:  TOPROL XL  Take 1 tablet (25 mg total) by mouth daily.     nicotine 10 MG inhaler  Commonly known as:  NICOTROL  Inhale 1 puff into the lungs as needed for smoking cessation.     NITROSTAT  0.4 MG SL tablet  Generic drug:  nitroGLYCERIN  Place 0.4 mg under the tongue every 5 (five) minutes as needed for chest pain.     omega-3 acid ethyl esters 1 G capsule  Commonly known as:  LOVAZA  Take 2 capsules (2 g total) by mouth daily.     omeprazole 20 MG capsule  Commonly known as:  PRILOSEC  Take 1 capsule (20 mg total) by mouth 2 (two) times daily.     prasugrel 10 MG Tabs tablet  Commonly known as:  EFFIENT  Take 10 mg by mouth daily.     pravastatin 20 MG tablet  Commonly known as:  PRAVACHOL  Take 20 mg by mouth at bedtime.     tiotropium 18 MCG inhalation capsule  Commonly known as:  SPIRIVA HANDIHALER  Place 1 capsule (18 mcg total) into inhaler and inhale daily.           Follow-up Information   Follow up with Marykay Lex, MD. (our office will call you with an appointment)    Specialty:  Cardiology   Contact information:   [address] [place] [address] [phone]      TIME SPENT ON DISCHARGE, INCLUDING PHYSICIAN TIME: >30 MINUTES  Signed: Allayne Butcher, PA-C 02/20/2013, 9:13 AM

## 2013-02-20 NOTE — Progress Notes (Signed)
Pt complained of chest tightness, swelling in hands and shortness of breath.  MD oncall, Dr. Adolm Joseph notified, advised to stop IVF.  BP prior to d/c of fluids at 130/61.  Will monitor and evaluate pt at intervals.

## 2013-02-20 NOTE — Progress Notes (Signed)
[facility]  Subjective: Currently CP free. Mild subjective SOB, but appears comfortable at rest.   Objective: Vital signs in last 24 hours: Temp:  [97.4 F (36.3 C)-98.1 F (36.7 C)] 97.4 F (36.3 C) (09/20 0441) Pulse Rate:  [55-66] 59 (09/20 0441) Resp:  [16-20] 20 (09/20 0441) BP: (80-133)/(43-75) 133/61 mmHg (09/20 0441) SpO2:  [95 %-99 %] 97 % (09/20 0441) Last BM Date: 02/18/13  Intake/Output from previous day: 09/19 0701 - 09/20 0700 In: 187.5 [I.V.:187.5] Out: -  Intake/Output this shift:    Medications Current Facility-Administered Medications  Medication Dose Route Frequency Provider Last Rate Last Dose  . 0.9 %  sodium chloride infusion   Intravenous Continuous Lennette Bihari, MD      . acetaminophen (TYLENOL) tablet 650 mg  650 mg Oral Q6H PRN Abelino Derrick, PA-C   650 mg at 02/20/13 0427  . amLODipine (NORVASC) tablet 2.5 mg  2.5 mg Oral Daily Eda Paschal Kilroy, PA-C      . aspirin EC tablet 81 mg  81 mg Oral Daily Luke K Kilroy, PA-C      . clonazePAM [facility]) tablet 2 mg  2 mg Oral TID PRN Abelino Derrick, PA-C      . gi cocktail (Maalox,Lidocaine,Donnatal)  30 mL Oral TID PRN Hillary Bow, DO      . heparin injection 5,000 Units  5,000 Units Subcutaneous [address] Hopkins, PA-C   5,000 Units at 02/20/13 [phone]  . isosorbide mononitrate (IMDUR) 24 hr tablet 30 mg  30 mg Oral Daily Luke K Kilroy, PA-C      . metoprolol succinate (TOPROL-XL) 24 hr tablet 25 mg  25 mg Oral Daily Luke K Kilroy, PA-C      . morphine 4 MG/ML injection 4 mg  4 mg Intravenous Q4H PRN Hillary Bow, DO   4 mg at 02/19/13 1223  . nicotine (NICOTROL) 10 MG inhaler 1 continuous puffing  1 continuous puffing Inhalation PRN Abelino Derrick, PA-C      . nitroGLYCERIN (NITROSTAT) SL tablet 0.4 mg  0.4 mg Sublingual Q5 min PRN Sunnie Nielsen, MD      . omega-3 acid ethyl esters (LOVAZA) capsule 2 g  2 g Oral Daily Lennette Bihari, MD      . ondansetron [facility])  injection 4 mg  4 mg Intravenous Q6H PRN Lennette Bihari, MD      . pantoprazole (PROTONIX) EC tablet 40 mg  40 mg Oral Daily Abelino Derrick, PA-C   40 mg at 02/19/13 1216  . prasugrel (EFFIENT) tablet 10 mg  10 mg Oral Daily Abelino Derrick, PA-C   10 mg at 02/19/13 2207  . pravastatin (PRAVACHOL) tablet 20 mg  20 mg Oral QHS Lennette Bihari, MD   20 mg at 02/19/13 2207  . sodium chloride (OCEAN) 0.65 % nasal spray 1 spray  1 spray Each Nare PRN Lennette Bihari, MD   1 spray at 02/20/13 [phone]  . sodium chloride 0.9 % injection 3 mL  3 mL Intravenous Q12H Hillary Bow, DO      . tiotropium [facility]) inhalation capsule 18 mcg  18 mcg Inhalation Daily Abelino Derrick, PA-C        PE: General appearance: alert, cooperative and no distress Lungs: clear to auscultation bilaterally Heart: regular rate and rhythm, S1, S2 normal, no murmur, click, rub or gallop Extremities: no LEE, right groin: no ecchymosis, no hematoma, thrill  or bruit Pulses: 2+ and symmetric Skin: warm and dry Neurologic: Grossly normal  Lab Results:   Recent Labs  02/18/13 2200  WBC 7.1  HGB 14.0  HCT 39.7  PLT 195   BMET  Recent Labs  02/18/13 2200  NA 140  K 4.1  CL 104  CO2 27  GLUCOSE 71  BUN 13  CREATININE 0.58  CALCIUM 9.5   PT/INR  Recent Labs  02/19/13 1100  LABPROT 13.9  INR 1.09   Cholesterol No results found for this basename: CHOL,  in the last 72 hours Cardiac Enzymes No components found with this basename: TROPONIN,  CKMB,   Studies/Results:  LHC 02/19/13  HEMODYNAMICS:  Central Aorta: 92/51  Left Ventricle: 92/7  ANGIOGRAPHY:  1. Left main: Normal and bifurcated into a large LAD and left circumflex vessel.  2. LAD: Appeared angiographically normal. The midportion of the LAD seem to dip into myocardial leak. No spasm was noted today in contrast to the patient's prior cardiac catheterization in the mid LAD segment.  3. Left circumflex: The stent in the mid AV groove circumflex  between the OM2 and OM 3 vessel was patent and the bifurcation stents in the OM3 and also in the AV groove circumflex beyond the OM3 vessel were patent. There is mild smooth 20% narrowing at the ostium of the OM3 which was not felt to be significant. There was TIMI-3 flow  4. Right coronary artery: Had a widely patent stent in the midportion. There was mild 30% narrowing in the very proximal RCA just beyond the catheter tip with subsequent catheter-induced additional vasospasm. This did resolve with IC nitroglycerin administration. There was no change in the previously noted 30% distal RCA narrowing in the region of the acute margin. 2 small marginal branches arose within the stented segment. The second marginal branch was diminutive size vessel with jailed ostial narrowing of 70% in this is very small diminutive twig-like branch.  5. Left ventriculography revealed normal LV contractility without focal segmental wall motion abnormalities. Estimated ejection fraction was at least 55-60%.  IMPRESSION:  Normal LV function with ejection fraction of 55-60%.  Mild coronary artery disease with normal LAD system.  Patent stents in the mid left circumflex coronary artery extending into bifurcation stents in the OM3 and AV groove with smooth 20% ostial narrowing within the stented segment in the OM 3 vessel with brisk TIMI-3 flow.  Patent mid right carotid artery stent with proximal RCA narrowing of 30% associated with coronary vasospasm which did improve with IC nitroglycerin and no change in the previously noted 30% distal RCA narrowing in the region of the acute margin proximal to the PDA takeoff.    Assessment/Plan  Principal Problem:   Chest pain with moderate risk of acute coronary syndrome Active Problems:   DYSLIPIDEMIA   ANXIETY   NICOTINE ADDICTION - Tobacco Abuse, long standing   RESTLESS LEG SYNDROME   COPD   GERD   CAD -S/P MI-PCI AVG 5/13 then staged DES to RCA  11/06/11. ISR- PCI  11/19/12  Plan: S/P LHC yesterday revealing mild CAD with normal LAD system, patent stents and normal LV function with an EF of 55-60%. Plan is for medical therapy with low-dose nitrate and/or amlodipine for coronary vasospasm. Post-cath recovery was complicated by hypotension. Pt improved with IVFs. SBP this AM in the 130s. Currently CP free. The right femoral access site is stable, free from hematoma and bruit. Plan for discharge today. MD to follow.  LOS: 2 days    Michele [name] 02/20/2013 7:39 AM   Patient seen and examined. Agree with assessment and plan. Feels well. No chest pain. Groin stable at cath site. Again discussed complete smoking cessation. And potential for nicotine induced vasospasm. DC today; f/u with Dr. [name].   Lennette Bihari, MD, [facility] 02/20/2013 8:47 AM

## 2013-02-23 ENCOUNTER — Encounter: Payer: Self-pay | Admitting: *Deleted

## 2013-03-02 ENCOUNTER — Ambulatory Visit (INDEPENDENT_AMBULATORY_CARE_PROVIDER_SITE_OTHER): Payer: Medicare Other | Admitting: Cardiology

## 2013-03-02 ENCOUNTER — Encounter: Payer: Self-pay | Admitting: Cardiology

## 2013-03-02 VITALS — BP 120/78 | HR 80 | Ht 65.5 in | Wt 161.8 lb

## 2013-03-02 DIAGNOSIS — I2510 Atherosclerotic heart disease of native coronary artery without angina pectoris: Secondary | ICD-10-CM

## 2013-03-02 DIAGNOSIS — F411 Generalized anxiety disorder: Secondary | ICD-10-CM

## 2013-03-02 DIAGNOSIS — F17200 Nicotine dependence, unspecified, uncomplicated: Secondary | ICD-10-CM | POA: Diagnosis not present

## 2013-03-02 DIAGNOSIS — E785 Hyperlipidemia, unspecified: Secondary | ICD-10-CM | POA: Diagnosis not present

## 2013-03-02 DIAGNOSIS — Z9861 Coronary angioplasty status: Secondary | ICD-10-CM

## 2013-03-02 NOTE — Patient Instructions (Addendum)
Your physician recommends that you schedule a follow-up appointment in: 3 months Fasting labs  Smoking cessation class Oct 6th at [facility]

## 2013-03-02 NOTE — Progress Notes (Signed)
03/02/2013 Michele [name]   07-08-1955  [id number]  Primary Physicia Johny Blamer, MD Primary Cardiologist: Dr [name]  HPI:  The patient is a complicated 57 year old female followed in our group for coronary disease. She had a DMI in May of 2013 treated with a circumflex DES. She had some residual disease in the RCA that was intervened on in a staged fashion in June 2013. She had a Myoview in Oct 2013 that was negative for ischemia but she had a hypotensive response. Her condition is complicated by severe anxiety. She was admitted through the ER 11/18/12 with complaints of chest and SOB. Her Troponin and EKG were normal but she continued to have chest and it was decided to proceed with diagnostic cath. This was done 11/19/12 and revealed ISR of the AV groove stent at the bifurcation of the OM3. This was intervened on with a DES by Dr [name] 11/19/12.  She was seen in the hospital in consult 02/19/13 for chest pain. She was taken to the lab by  02/18/13 by Dr Tresa Endo. Cath revealed patent stents, with some vasospasm noted. She was treated medically and is here now for follow up. Today she says she feels OK. She is under "a lot of stress". Her mother's aid quit and the pt is shouldering more responsibility for this. She denies angina. She has several other complaints, "tingling" in her legs, fatigue, wgt gain. She continues to smoke but has cut down to 1/2 pk a day or less.     Current Outpatient Prescriptions  Medication Sig Dispense Refill  . acetaminophen (TYLENOL) 325 MG tablet Take 2 tablets (650 mg total) by mouth every 6 (six) hours as needed.      [name] amLODipine (NORVASC) 2.5 MG tablet Take 1 tablet (2.5 mg total) by mouth daily.  30 tablet  11  . aspirin 81 MG EC tablet Take 81 mg by mouth daily. Swallow whole.      . Calcium Carbonate-Vitamin D (CALCIUM 600+D PO) Take 1 tablet by mouth 2 (two) times daily.       . clonazePAM (KLONOPIN) 2 MG tablet Take 2 mg by mouth 3 (three) times daily as  needed for anxiety.      . Coenzyme Q10 (COQ10) 100 MG CAPS Take 1 tablet by mouth daily.       . isosorbide mononitrate (IMDUR) 30 MG 24 hr tablet Take 30 mg by mouth daily.       . metoprolol succinate (TOPROL XL) 25 MG 24 hr tablet Take 1 tablet (25 mg total) by mouth daily.  30 tablet  6  . NITROSTAT 0.4 MG SL tablet Place 0.4 mg under the tongue every 5 (five) minutes as needed for chest pain.       [name] omega-3 acid ethyl esters (LOVAZA) 1 G capsule Take 2 capsules (2 g total) by mouth daily.  30 capsule  5  . omeprazole (PRILOSEC) 20 MG capsule Take 20 mg by mouth daily.      . prasugrel (EFFIENT) 10 MG TABS tablet Take 10 mg by mouth daily.      . pravastatin (PRAVACHOL) 20 MG tablet Take 20 mg by mouth at bedtime.      [name] tiotropium (SPIRIVA) 18 MCG inhalation capsule Place 18 mcg into inhaler and inhale daily.      . nicotine (NICOTROL) 10 MG inhaler Inhale 1 puff into the lungs as needed for smoking cessation.  42 each  3   No current facility-administered medications for this  visit.    Allergies  Allergen Reactions  . Ibuprofen Other (See Comments)    GI upset  . Sulfonamide Derivatives Itching and Rash  . Aspirin Other (See Comments)    REACTION: GI upset (takes baby aspirin at night)  . Crestor [Rosuvastatin]     Myalgias   . Lipitor [Atorvastatin]     Myalgias   . Wellbutrin [Bupropion] Palpitations    History   Social History  . Marital Status: Divorced    Spouse Name: N/A    Number of Children: 5  . Years of Education: N/A   Occupational History  . Disabled     Social History Main Topics  . Smoking status: Current Every Day Smoker -- 0.50 packs/day for 40 years    Types: Cigarettes  . Smokeless tobacco: Never Used     Comment: 04/15/12 "I quit once for 2 1/2 years; smoking cessation counselor already here to visit"; done to less than 1/2 ppd (03/02/2013)  . Alcohol Use: Yes     Comment: occasional  . Drug Use: No  . Sexual Activity: Not Currently     Birth Control/ Protection: Post-menopausal   Other Topics Concern  . 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  Quit smoking 06/2007 but restarted 1/11 -- smoking a pack a day. 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   0 Caffeine drinks daily      Review of Systems: General: negative for chills, fever, night sweats or weight changes.  Cardiovascular: negative for chest pain, dyspnea on exertion, edema, orthopnea, palpitations, paroxysmal nocturnal dyspnea or shortness of breath Dermatological: negative for rash Respiratory: negative for cough or wheezing Urologic: negative for hematuria Abdominal: negative for nausea, vomiting, diarrhea, bright red blood per rectum, melena, or hematemesis Neurologic: negative for visual changes, syncope, or dizziness All other systems reviewed and are otherwise negative except as noted above.    Blood pressure 120/78, pulse 80, height 5' 5.5" (1.664 m), weight 161 lb 12.8 oz (73.392 kg).  General appearance: alert, cooperative and no distress Lungs: clear to auscultation bilaterally Heart: regular rate and rhythm Extremities: no edema    ASSESSMENT AND PLAN:   CAD -S/P MI-PCI AVG 5/13 then staged DES to RCA  11/06/11. ISR- PCI 11/19/12, cath 02/18/13- no ISR, + spasm Cath 02/18/13- patent stents with some spasm noted  ANXIETY .  DYSLIPIDEMIA .  NICOTINE ADDICTION - Tobacco Abuse, long standing .   PLAN  Continue current medical Rx. I encouraged her to attend [facility] smoking cessation classes starting in Oct. She asked about her lipids and I put in an order for these to be rechecked. I'm not sure what we can do with the information except inform the pt as she has been intol to higher dose statins.  Lakeem Rozo KPA-C 03/02/2013 4:55 PM

## 2013-03-02 NOTE — Assessment & Plan Note (Signed)
Cath 02/18/13- patent stents with some spasm noted

## 2013-03-12 DIAGNOSIS — J441 Chronic obstructive pulmonary disease with (acute) exacerbation: Secondary | ICD-10-CM | POA: Diagnosis not present

## 2013-03-12 DIAGNOSIS — R7309 Other abnormal glucose: Secondary | ICD-10-CM | POA: Diagnosis not present

## 2013-03-12 DIAGNOSIS — J069 Acute upper respiratory infection, unspecified: Secondary | ICD-10-CM | POA: Diagnosis not present

## 2013-03-16 DIAGNOSIS — E785 Hyperlipidemia, unspecified: Secondary | ICD-10-CM | POA: Diagnosis not present

## 2013-03-16 LAB — LIPID PANEL
Cholesterol: 132 mg/dL (ref 0–200)
HDL: 34 mg/dL — ABNORMAL LOW (ref >39)
LDL Cholesterol: 77 mg/dL (ref 0–99)
Total CHOL/HDL Ratio: 3.9 Ratio
Triglycerides: 106 mg/dL (ref <150)
VLDL: 21 mg/dL (ref 0–40)

## 2013-03-16 LAB — COMPREHENSIVE METABOLIC PANEL
ALT: 12 U/L (ref 0–35)
AST: 13 U/L (ref 0–37)
Albumin: 4.2 g/dL (ref 3.5–5.2)
Alkaline Phosphatase: 66 U/L (ref 39–117)
BUN: 15 mg/dL (ref 6–23)
CO2: 28 mEq/L (ref 19–32)
Calcium: 10.2 mg/dL (ref 8.4–10.5)
Chloride: 105 mEq/L (ref 96–112)
Creat: 0.71 mg/dL (ref 0.50–1.10)
Glucose, Bld: 127 mg/dL — ABNORMAL HIGH (ref 70–99)
Potassium: 4.1 mEq/L (ref 3.5–5.3)
Sodium: 141 mEq/L (ref 135–145)
Total Bilirubin: 0.5 mg/dL (ref 0.3–1.2)
Total Protein: 6.3 g/dL (ref 6.0–8.3)

## 2013-03-22 ENCOUNTER — Emergency Department (HOSPITAL_COMMUNITY)
Admission: EM | Admit: 2013-03-22 | Discharge: 2013-03-22 | Disposition: A | Discharge status: Home / Self Care | Payer: Medicare Other | Attending: Emergency Medicine | Admitting: Emergency Medicine

## 2013-03-22 ENCOUNTER — Emergency Department (HOSPITAL_COMMUNITY): Payer: Medicare Other

## 2013-03-22 ENCOUNTER — Encounter (HOSPITAL_COMMUNITY): Payer: Self-pay | Admitting: Emergency Medicine

## 2013-03-22 DIAGNOSIS — R0789 Other chest pain: Secondary | ICD-10-CM | POA: Diagnosis not present

## 2013-03-22 DIAGNOSIS — J441 Chronic obstructive pulmonary disease with (acute) exacerbation: Secondary | ICD-10-CM | POA: Diagnosis not present

## 2013-03-22 DIAGNOSIS — Z882 Allergy status to sulfonamides status: Secondary | ICD-10-CM | POA: Diagnosis not present

## 2013-03-22 DIAGNOSIS — R002 Palpitations: Secondary | ICD-10-CM | POA: Diagnosis not present

## 2013-03-22 DIAGNOSIS — R350 Frequency of micturition: Secondary | ICD-10-CM | POA: Insufficient documentation

## 2013-03-22 DIAGNOSIS — J209 Acute bronchitis, unspecified: Secondary | ICD-10-CM | POA: Insufficient documentation

## 2013-03-22 DIAGNOSIS — Z9189 Other specified personal risk factors, not elsewhere classified: Secondary | ICD-10-CM | POA: Insufficient documentation

## 2013-03-22 DIAGNOSIS — Z79899 Other long term (current) drug therapy: Secondary | ICD-10-CM | POA: Insufficient documentation

## 2013-03-22 DIAGNOSIS — F329 Major depressive disorder, single episode, unspecified: Secondary | ICD-10-CM | POA: Diagnosis not present

## 2013-03-22 DIAGNOSIS — R300 Dysuria: Secondary | ICD-10-CM | POA: Diagnosis not present

## 2013-03-22 DIAGNOSIS — J449 Chronic obstructive pulmonary disease, unspecified: Secondary | ICD-10-CM | POA: Diagnosis not present

## 2013-03-22 DIAGNOSIS — I2510 Atherosclerotic heart disease of native coronary artery without angina pectoris: Secondary | ICD-10-CM | POA: Insufficient documentation

## 2013-03-22 DIAGNOSIS — Z8719 Personal history of other diseases of the digestive system: Secondary | ICD-10-CM | POA: Diagnosis not present

## 2013-03-22 DIAGNOSIS — R109 Unspecified abdominal pain: Secondary | ICD-10-CM | POA: Diagnosis not present

## 2013-03-22 DIAGNOSIS — G4700 Insomnia, unspecified: Secondary | ICD-10-CM | POA: Insufficient documentation

## 2013-03-22 DIAGNOSIS — I259 Chronic ischemic heart disease, unspecified: Secondary | ICD-10-CM | POA: Diagnosis not present

## 2013-03-22 DIAGNOSIS — E785 Hyperlipidemia, unspecified: Secondary | ICD-10-CM | POA: Diagnosis not present

## 2013-03-22 DIAGNOSIS — F17200 Nicotine dependence, unspecified, uncomplicated: Secondary | ICD-10-CM | POA: Insufficient documentation

## 2013-03-22 DIAGNOSIS — G2581 Restless legs syndrome: Secondary | ICD-10-CM | POA: Diagnosis not present

## 2013-03-22 DIAGNOSIS — Z7982 Long term (current) use of aspirin: Secondary | ICD-10-CM | POA: Insufficient documentation

## 2013-03-22 DIAGNOSIS — G8929 Other chronic pain: Secondary | ICD-10-CM | POA: Diagnosis not present

## 2013-03-22 DIAGNOSIS — K219 Gastro-esophageal reflux disease without esophagitis: Secondary | ICD-10-CM | POA: Diagnosis not present

## 2013-03-22 DIAGNOSIS — D239 Other benign neoplasm of skin, unspecified: Secondary | ICD-10-CM | POA: Insufficient documentation

## 2013-03-22 DIAGNOSIS — F411 Generalized anxiety disorder: Secondary | ICD-10-CM | POA: Diagnosis not present

## 2013-03-22 DIAGNOSIS — R062 Wheezing: Secondary | ICD-10-CM | POA: Diagnosis not present

## 2013-03-22 DIAGNOSIS — R079 Chest pain, unspecified: Secondary | ICD-10-CM | POA: Diagnosis not present

## 2013-03-22 DIAGNOSIS — L259 Unspecified contact dermatitis, unspecified cause: Secondary | ICD-10-CM | POA: Diagnosis not present

## 2013-03-22 DIAGNOSIS — M25569 Pain in unspecified knee: Secondary | ICD-10-CM | POA: Diagnosis not present

## 2013-03-22 DIAGNOSIS — R0602 Shortness of breath: Secondary | ICD-10-CM | POA: Insufficient documentation

## 2013-03-22 DIAGNOSIS — J40 Bronchitis, not specified as acute or chronic: Secondary | ICD-10-CM

## 2013-03-22 DIAGNOSIS — M5412 Radiculopathy, cervical region: Secondary | ICD-10-CM | POA: Diagnosis not present

## 2013-03-22 DIAGNOSIS — F3289 Other specified depressive episodes: Secondary | ICD-10-CM | POA: Insufficient documentation

## 2013-03-22 DIAGNOSIS — E559 Vitamin D deficiency, unspecified: Secondary | ICD-10-CM | POA: Insufficient documentation

## 2013-03-22 DIAGNOSIS — Z9861 Coronary angioplasty status: Secondary | ICD-10-CM | POA: Insufficient documentation

## 2013-03-22 DIAGNOSIS — J4489 Other specified chronic obstructive pulmonary disease: Secondary | ICD-10-CM | POA: Insufficient documentation

## 2013-03-22 DIAGNOSIS — R072 Precordial pain: Secondary | ICD-10-CM | POA: Diagnosis not present

## 2013-03-22 DIAGNOSIS — Z888 Allergy status to other drugs, medicaments and biological substances status: Secondary | ICD-10-CM | POA: Insufficient documentation

## 2013-03-22 LAB — URINALYSIS, ROUTINE W REFLEX MICROSCOPIC
Bilirubin Urine: NEGATIVE
Glucose, UA: NEGATIVE mg/dL
Hgb urine dipstick: NEGATIVE
Ketones, ur: NEGATIVE mg/dL
Leukocytes, UA: NEGATIVE
Nitrite: NEGATIVE
Protein, ur: NEGATIVE mg/dL
Specific Gravity, Urine: 1.012 (ref 1.005–1.030)
Urobilinogen, UA: 0.2 mg/dL (ref 0.0–1.0)
pH: 7.5 (ref 5.0–8.0)

## 2013-03-22 LAB — CBC
HCT: 40.1 % (ref 36.0–46.0)
Hemoglobin: 13 g/dL (ref 12.0–15.0)
MCH: 28.6 pg (ref 26.0–34.0)
MCHC: 32.4 g/dL (ref 30.0–36.0)
MCV: 88.3 fL (ref 78.0–100.0)
Platelets: 171 10*3/uL (ref 150–400)
RBC: 4.54 MIL/uL (ref 3.87–5.11)
RDW: 13 % (ref 11.5–15.5)
WBC: 5.6 10*3/uL (ref 4.0–10.5)

## 2013-03-22 LAB — BASIC METABOLIC PANEL
BUN: 10 mg/dL (ref 6–23)
CO2: 27 mEq/L (ref 19–32)
Calcium: 9.5 mg/dL (ref 8.4–10.5)
Chloride: 107 mEq/L (ref 96–112)
Creatinine, Ser: 0.58 mg/dL (ref 0.50–1.10)
GFR calc Af Amer: >90 mL/min (ref >90)
GFR calc non Af Amer: >90 mL/min (ref >90)
Glucose, Bld: 110 mg/dL — ABNORMAL HIGH (ref 70–99)
Potassium: 4.4 mEq/L (ref 3.5–5.1)
Sodium: 141 mEq/L (ref 135–145)

## 2013-03-22 LAB — POCT I-STAT TROPONIN I: Troponin i, poc: 0 ng/mL (ref 0.00–0.08)

## 2013-03-22 MED ORDER — ALBUTEROL SULFATE (5 MG/ML) 0.5% IN NEBU
5.0000 mg | INHALATION_SOLUTION | Freq: Once | RESPIRATORY_TRACT | Status: AC
  Administered 2013-03-22: 5 mg via RESPIRATORY_TRACT
  Filled 2013-03-22: qty 1

## 2013-03-22 MED ORDER — IPRATROPIUM BROMIDE 0.02 % IN SOLN
0.5000 mg | Freq: Once | RESPIRATORY_TRACT | Status: AC
  Administered 2013-03-22: 0.5 mg via RESPIRATORY_TRACT
  Filled 2013-03-22: qty 2.5

## 2013-03-22 MED ORDER — HYDROCODONE-ACETAMINOPHEN 5-325 MG PO TABS: 1.0000 | ORAL_TABLET | Freq: Four times a day (QID) | ORAL | Status: DC | PRN

## 2013-03-22 MED ORDER — ALBUTEROL SULFATE HFA 108 (90 BASE) MCG/ACT IN AERS
2.0000 | INHALATION_SPRAY | Freq: Once | RESPIRATORY_TRACT | Status: AC
  Administered 2013-03-22: 2 via RESPIRATORY_TRACT
  Filled 2013-03-22: qty 6.7

## 2013-03-22 MED ORDER — ALBUTEROL SULFATE HFA 108 (90 BASE) MCG/ACT IN AERS: 1.0000 | INHALATION_SPRAY | RESPIRATORY_TRACT | Status: DC | PRN
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
## 2013-03-22 NOTE — ED Notes (Signed)
Ambulated pt to the bathroom pt was able to ambulate without any assistance.

## 2013-03-22 NOTE — ED Notes (Signed)
Attempted to draw pts labs was unsuccessful. Called lab

## 2013-03-22 NOTE — ED Notes (Signed)
Pt states recently finished antibiotics for URI. Continues to have dry cough and has began to have chest pressure/spasms for past few days. Sent by PCP for further evaluation and CXR.

## 2013-03-22 NOTE — ED Notes (Signed)
Report received, assumed care.  

## 2013-03-22 NOTE — ED Notes (Signed)
EKG was shot at 12:59 and signed by Dr. Anitra Lauth

## 2013-03-22 NOTE — ED Notes (Signed)
Patient transported to X-ray 

## 2013-03-22 NOTE — ED Notes (Signed)
To ED for CXR. Pt sent from PMDs office. Seen for URI 10d ago and finished abx. Still with chest pressure and dry cough.

## 2013-03-22 NOTE — Progress Notes (Signed)
Pt called and informed about her labwork

## 2013-03-24 DIAGNOSIS — J449 Chronic obstructive pulmonary disease, unspecified: Secondary | ICD-10-CM | POA: Diagnosis not present

## 2013-03-24 DIAGNOSIS — R32 Unspecified urinary incontinence: Secondary | ICD-10-CM | POA: Diagnosis not present

## 2013-03-24 DIAGNOSIS — M549 Dorsalgia, unspecified: Secondary | ICD-10-CM | POA: Diagnosis not present

## 2013-03-24 DIAGNOSIS — L738 Other specified follicular disorders: Secondary | ICD-10-CM | POA: Diagnosis not present

## 2013-03-24 DIAGNOSIS — L678 Other hair color and hair shaft abnormalities: Secondary | ICD-10-CM | POA: Diagnosis not present

## 2013-04-02 DIAGNOSIS — H11159 Pinguecula, unspecified eye: Secondary | ICD-10-CM | POA: Diagnosis not present

## 2013-04-02 DIAGNOSIS — H2510 Age-related nuclear cataract, unspecified eye: Secondary | ICD-10-CM | POA: Diagnosis not present

## 2013-04-02 DIAGNOSIS — H11449 Conjunctival cysts, unspecified eye: Secondary | ICD-10-CM | POA: Diagnosis not present

## 2013-04-02 DIAGNOSIS — H43819 Vitreous degeneration, unspecified eye: Secondary | ICD-10-CM | POA: Diagnosis not present

## 2013-04-06 DIAGNOSIS — Z23 Encounter for immunization: Secondary | ICD-10-CM | POA: Diagnosis not present

## 2013-04-30 ENCOUNTER — Encounter (HOSPITAL_COMMUNITY): Payer: Self-pay | Admitting: Emergency Medicine

## 2013-04-30 ENCOUNTER — Inpatient Hospital Stay (HOSPITAL_COMMUNITY)
Admission: EM | Admit: 2013-04-30 | Discharge: 2013-05-01 | DRG: 313 | Disposition: A | Discharge status: Home / Self Care | Payer: Medicare Other | Attending: Internal Medicine | Admitting: Internal Medicine

## 2013-04-30 ENCOUNTER — Emergency Department (HOSPITAL_COMMUNITY): Payer: Medicare Other

## 2013-04-30 DIAGNOSIS — R0789 Other chest pain: Principal | ICD-10-CM | POA: Diagnosis present

## 2013-04-30 DIAGNOSIS — Z951 Presence of aortocoronary bypass graft: Secondary | ICD-10-CM | POA: Diagnosis not present

## 2013-04-30 DIAGNOSIS — I252 Old myocardial infarction: Secondary | ICD-10-CM | POA: Diagnosis not present

## 2013-04-30 DIAGNOSIS — K219 Gastro-esophageal reflux disease without esophagitis: Secondary | ICD-10-CM | POA: Diagnosis present

## 2013-04-30 DIAGNOSIS — F329 Major depressive disorder, single episode, unspecified: Secondary | ICD-10-CM | POA: Diagnosis present

## 2013-04-30 DIAGNOSIS — I200 Unstable angina: Secondary | ICD-10-CM

## 2013-04-30 DIAGNOSIS — Z720 Tobacco use: Secondary | ICD-10-CM

## 2013-04-30 DIAGNOSIS — I25119 Atherosclerotic heart disease of native coronary artery with unspecified angina pectoris: Secondary | ICD-10-CM

## 2013-04-30 DIAGNOSIS — F411 Generalized anxiety disorder: Secondary | ICD-10-CM | POA: Diagnosis present

## 2013-04-30 DIAGNOSIS — I209 Angina pectoris, unspecified: Secondary | ICD-10-CM | POA: Diagnosis not present

## 2013-04-30 DIAGNOSIS — R079 Chest pain, unspecified: Secondary | ICD-10-CM | POA: Diagnosis not present

## 2013-04-30 DIAGNOSIS — Z7982 Long term (current) use of aspirin: Secondary | ICD-10-CM | POA: Diagnosis not present

## 2013-04-30 DIAGNOSIS — I201 Angina pectoris with documented spasm: Secondary | ICD-10-CM | POA: Diagnosis not present

## 2013-04-30 DIAGNOSIS — F419 Anxiety disorder, unspecified: Secondary | ICD-10-CM | POA: Diagnosis present

## 2013-04-30 DIAGNOSIS — Z9861 Coronary angioplasty status: Secondary | ICD-10-CM

## 2013-04-30 DIAGNOSIS — Z79899 Other long term (current) drug therapy: Secondary | ICD-10-CM | POA: Diagnosis not present

## 2013-04-30 DIAGNOSIS — I2510 Atherosclerotic heart disease of native coronary artery without angina pectoris: Secondary | ICD-10-CM | POA: Diagnosis not present

## 2013-04-30 DIAGNOSIS — F3289 Other specified depressive episodes: Secondary | ICD-10-CM | POA: Diagnosis present

## 2013-04-30 DIAGNOSIS — J438 Other emphysema: Secondary | ICD-10-CM | POA: Diagnosis present

## 2013-04-30 DIAGNOSIS — E785 Hyperlipidemia, unspecified: Secondary | ICD-10-CM | POA: Diagnosis present

## 2013-04-30 DIAGNOSIS — F17200 Nicotine dependence, unspecified, uncomplicated: Secondary | ICD-10-CM | POA: Diagnosis present

## 2013-04-30 LAB — BASIC METABOLIC PANEL
BUN: 12 mg/dL (ref 6–23)
CO2: 27 mEq/L (ref 19–32)
Calcium: 9.5 mg/dL (ref 8.4–10.5)
Chloride: 105 mEq/L (ref 96–112)
Creatinine, Ser: 0.6 mg/dL (ref 0.50–1.10)
GFR calc Af Amer: >90 mL/min (ref >90)
GFR calc non Af Amer: >90 mL/min (ref >90)
Glucose, Bld: 134 mg/dL — ABNORMAL HIGH (ref 70–99)
Potassium: 3.8 mEq/L (ref 3.5–5.1)
Sodium: 141 mEq/L (ref 135–145)

## 2013-04-30 LAB — CBC
HCT: 42.5 % (ref 36.0–46.0)
Hemoglobin: 14.4 g/dL (ref 12.0–15.0)
MCH: 29.6 pg (ref 26.0–34.0)
MCHC: 33.9 g/dL (ref 30.0–36.0)
MCV: 87.3 fL (ref 78.0–100.0)
Platelets: 187 10*3/uL (ref 150–400)
RBC: 4.87 MIL/uL (ref 3.87–5.11)
RDW: 13.2 % (ref 11.5–15.5)
WBC: 5.4 10*3/uL (ref 4.0–10.5)

## 2013-04-30 LAB — POCT I-STAT TROPONIN I
Troponin i, poc: 0 ng/mL (ref 0.00–0.08)
Troponin i, poc: 0 ng/mL (ref 0.00–0.08)

## 2013-04-30 LAB — MRSA PCR SCREENING: MRSA by PCR: NEGATIVE

## 2013-04-30 LAB — PRO B NATRIURETIC PEPTIDE: Pro B Natriuretic peptide (BNP): 102.3 pg/mL (ref 0–125)

## 2013-04-30 MED ORDER — AMLODIPINE BESYLATE 2.5 MG PO TABS
2.5000 mg | ORAL_TABLET | Freq: Every day | ORAL | Status: DC
  Administered 2013-05-01: 2.5 mg via ORAL
  Filled 2013-04-30: qty 1

## 2013-04-30 MED ORDER — MORPHINE SULFATE 4 MG/ML IJ SOLN
4.0000 mg | Freq: Once | INTRAMUSCULAR | Status: AC
Start: 2013-04-30 — End: 2013-04-30
  Administered 2013-04-30: 4 mg via INTRAVENOUS
  Filled 2013-04-30: qty 1

## 2013-04-30 MED ORDER — OMEGA-3-ACID ETHYL ESTERS 1 G PO CAPS
1.0000 g | ORAL_CAPSULE | Freq: Two times a day (BID) | ORAL | Status: DC
  Administered 2013-04-30 – 2013-05-01 (×2): 1 g via ORAL
  Filled 2013-04-30 (×3): qty 1

## 2013-04-30 MED ORDER — HYDROMORPHONE HCL PF 1 MG/ML IJ SOLN: 1.0000 mg | INTRAMUSCULAR | Status: DC | PRN

## 2013-04-30 MED ORDER — HEPARIN SODIUM (PORCINE) 5000 UNIT/ML IJ SOLN
5000.0000 [IU] | Freq: Three times a day (TID) | INTRAMUSCULAR | Status: DC
  Administered 2013-04-30 – 2013-05-01 (×2): 5000 [IU] via SUBCUTANEOUS
  Filled 2013-04-30 (×5): qty 1

## 2013-04-30 MED ORDER — PANTOPRAZOLE SODIUM 40 MG PO TBEC
40.0000 mg | DELAYED_RELEASE_TABLET | Freq: Every day | ORAL | Status: DC
  Administered 2013-05-01: 40 mg via ORAL
  Filled 2013-04-30: qty 1

## 2013-04-30 MED ORDER — ONDANSETRON HCL 4 MG/2ML IJ SOLN: 4.0000 mg | Freq: Four times a day (QID) | INTRAMUSCULAR | Status: DC | PRN

## 2013-04-30 MED ORDER — ASPIRIN 81 MG PO TBEC: 81.0000 mg | DELAYED_RELEASE_TABLET | Freq: Every evening | ORAL | Status: DC

## 2013-04-30 MED ORDER — NITROGLYCERIN IN D5W 200-5 MCG/ML-% IV SOLN
3.0000 ug/min | INTRAVENOUS | Status: DC
  Administered 2013-04-30: 5 ug/min via INTRAVENOUS

## 2013-04-30 MED ORDER — GI COCKTAIL ~~LOC~~
30.0000 mL | Freq: Once | ORAL | Status: AC
  Administered 2013-04-30: 30 mL via ORAL
  Filled 2013-04-30: qty 30

## 2013-04-30 MED ORDER — NITROGLYCERIN 0.4 MG SL SUBL: 0.4000 mg | SUBLINGUAL_TABLET | SUBLINGUAL | Status: DC | PRN

## 2013-04-30 MED ORDER — ASPIRIN EC 81 MG PO TBEC: 81.0000 mg | DELAYED_RELEASE_TABLET | Freq: Every day | ORAL | Status: DC

## 2013-04-30 MED ORDER — ALUM & MAG HYDROXIDE-SIMETH 200-200-20 MG/5ML PO SUSP
30.0000 mL | Freq: Once | ORAL | Status: AC
  Administered 2013-04-30: 30 mL via ORAL
  Filled 2013-04-30: qty 30

## 2013-04-30 MED ORDER — ACETAMINOPHEN 325 MG PO TABS: 650.0000 mg | ORAL_TABLET | ORAL | Status: DC | PRN

## 2013-04-30 MED ORDER — SODIUM CHLORIDE 0.9 % IJ SOLN: 3.0000 mL | INTRAMUSCULAR | Status: DC | PRN

## 2013-04-30 MED ORDER — TRAMADOL HCL 50 MG PO TABS: 50.0000 mg | ORAL_TABLET | Freq: Four times a day (QID) | ORAL | Status: DC | PRN

## 2013-04-30 MED ORDER — SODIUM CHLORIDE 0.9 % IV SOLN
INTRAVENOUS | Status: DC
  Administered 2013-04-30: 10 mL/h via INTRAVENOUS

## 2013-04-30 MED ORDER — NITROGLYCERIN IN D5W 200-5 MCG/ML-% IV SOLN
INTRAVENOUS | Status: AC
  Filled 2013-04-30: qty 250

## 2013-04-30 MED ORDER — CLONAZEPAM 1 MG PO TABS: 2.0000 mg | ORAL_TABLET | Freq: Three times a day (TID) | ORAL | Status: DC | PRN

## 2013-04-30 MED ORDER — SODIUM CHLORIDE 0.9 % IV SOLN: 250.0000 mL | INTRAVENOUS | Status: DC | PRN

## 2013-04-30 MED ORDER — SODIUM CHLORIDE 0.9 % IJ SOLN
3.0000 mL | Freq: Two times a day (BID) | INTRAMUSCULAR | Status: DC
  Administered 2013-04-30 – 2013-05-01 (×2): 3 mL via INTRAVENOUS

## 2013-04-30 MED ORDER — ONDANSETRON HCL 4 MG/2ML IJ SOLN
4.0000 mg | Freq: Once | INTRAMUSCULAR | Status: AC
  Administered 2013-04-30: 4 mg via INTRAVENOUS
  Filled 2013-04-30: qty 2

## 2013-04-30 MED ORDER — SIMVASTATIN 20 MG PO TABS
20.0000 mg | ORAL_TABLET | Freq: Every day | ORAL | Status: DC
  Filled 2013-04-30: qty 1

## 2013-04-30 MED ORDER — ASPIRIN 81 MG PO CHEW
324.0000 mg | CHEWABLE_TABLET | Freq: Once | ORAL | Status: DC
  Filled 2013-04-30: qty 4

## 2013-04-30 MED ORDER — ASPIRIN 81 MG PO CHEW
81.0000 mg | CHEWABLE_TABLET | Freq: Every day | ORAL | Status: DC
  Administered 2013-04-30: 81 mg via ORAL
  Filled 2013-04-30 (×2): qty 1

## 2013-04-30 MED ORDER — PRASUGREL HCL 10 MG PO TABS
10.0000 mg | ORAL_TABLET | Freq: Every day | ORAL | Status: DC
  Administered 2013-05-01: 10 mg via ORAL
  Filled 2013-04-30: qty 1

## 2013-04-30 MED ORDER — METOPROLOL SUCCINATE ER 25 MG PO TB24
25.0000 mg | ORAL_TABLET | Freq: Every day | ORAL | Status: DC
  Filled 2013-04-30: qty 1

## 2013-04-30 MED ORDER — ALUM & MAG HYDROXIDE-SIMETH 200-200-20 MG/5ML PO SUSP
30.0000 mL | ORAL | Status: DC | PRN
  Administered 2013-05-01 (×2): 30 mL via ORAL
  Filled 2013-04-30 (×2): qty 30

## 2013-04-30 MED ORDER — TIOTROPIUM BROMIDE MONOHYDRATE 18 MCG IN CAPS
18.0000 ug | ORAL_CAPSULE | Freq: Every day | RESPIRATORY_TRACT | Status: DC
  Administered 2013-05-01: 18 ug via RESPIRATORY_TRACT
  Filled 2013-04-30: qty 5

## 2013-04-30 NOTE — ED Notes (Signed)
Patient is alert and oriented.  Reports she is feeling a little better.  Chest pain is 2/10.  Patient has been drinking and eating crackers w/o n/v.

## 2013-04-30 NOTE — ED Notes (Signed)
Patient back from xray, patient placed on cardiac monitor, patient states having pain in epigastric area at this time

## 2013-04-30 NOTE — ED Notes (Signed)
MD aware pt in pain again.

## 2013-04-30 NOTE — ED Notes (Signed)
Pt refused ASA at present

## 2013-04-30 NOTE — ED Notes (Signed)
Pt reports sudden onset of mid-sternum chest pain radiating under Left breast and to Left arm, SOB, weakness, pt also reports heaviness to her Left leg. No facial droop, arm drift, or neuro deficits noted, EKG performed in triage.
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
## 2013-04-30 NOTE — ED Notes (Signed)
Patient to xray at this time

## 2013-05-01 ENCOUNTER — Inpatient Hospital Stay (HOSPITAL_COMMUNITY): Payer: Medicare Other

## 2013-05-01 DIAGNOSIS — R079 Chest pain, unspecified: Secondary | ICD-10-CM | POA: Diagnosis not present

## 2013-05-01 DIAGNOSIS — I209 Angina pectoris, unspecified: Secondary | ICD-10-CM | POA: Diagnosis not present

## 2013-05-01 DIAGNOSIS — E785 Hyperlipidemia, unspecified: Secondary | ICD-10-CM | POA: Diagnosis not present

## 2013-05-01 DIAGNOSIS — I252 Old myocardial infarction: Secondary | ICD-10-CM | POA: Diagnosis not present

## 2013-05-01 DIAGNOSIS — I200 Unstable angina: Secondary | ICD-10-CM

## 2013-05-01 DIAGNOSIS — J438 Other emphysema: Secondary | ICD-10-CM | POA: Diagnosis not present

## 2013-05-01 DIAGNOSIS — R0789 Other chest pain: Secondary | ICD-10-CM | POA: Diagnosis not present

## 2013-05-01 DIAGNOSIS — I2510 Atherosclerotic heart disease of native coronary artery without angina pectoris: Secondary | ICD-10-CM | POA: Diagnosis not present

## 2013-05-01 LAB — CBC
HCT: 38.7 % (ref 36.0–46.0)
HCT: 39.3 % (ref 36.0–46.0)
Hemoglobin: 12.9 g/dL (ref 12.0–15.0)
Hemoglobin: 13.3 g/dL (ref 12.0–15.0)
MCH: 29.5 pg (ref 26.0–34.0)
MCH: 29.8 pg (ref 26.0–34.0)
MCHC: 33.3 g/dL (ref 30.0–36.0)
MCHC: 33.8 g/dL (ref 30.0–36.0)
MCV: 88.1 fL (ref 78.0–100.0)
MCV: 88.4 fL (ref 78.0–100.0)
Platelets: 174 10*3/uL (ref 150–400)
Platelets: 176 10*3/uL (ref 150–400)
RBC: 4.38 MIL/uL (ref 3.87–5.11)
RBC: 4.46 MIL/uL (ref 3.87–5.11)
RDW: 13.3 % (ref 11.5–15.5)
RDW: 13.3 % (ref 11.5–15.5)
WBC: 5.6 10*3/uL (ref 4.0–10.5)
WBC: 7.7 10*3/uL (ref 4.0–10.5)

## 2013-05-01 LAB — CREATININE, SERUM
Creatinine, Ser: 0.79 mg/dL (ref 0.50–1.10)
GFR calc Af Amer: >90 mL/min (ref >90)
GFR calc non Af Amer: 89 mL/min — ABNORMAL LOW (ref >90)

## 2013-05-01 LAB — LIPID PANEL
Cholesterol: 142 mg/dL (ref 0–200)
HDL: 26 mg/dL — ABNORMAL LOW (ref >39)
LDL Cholesterol: 83 mg/dL (ref 0–99)
Total CHOL/HDL Ratio: 5.5 RATIO
Triglycerides: 163 mg/dL — ABNORMAL HIGH (ref <150)
VLDL: 33 mg/dL (ref 0–40)

## 2013-05-01 LAB — TROPONIN I: Troponin I: <0.3 ng/mL (ref <0.30)

## 2013-05-01 LAB — BASIC METABOLIC PANEL
BUN: 15 mg/dL (ref 6–23)
CO2: 29 mEq/L (ref 19–32)
Calcium: 8.8 mg/dL (ref 8.4–10.5)
Chloride: 103 mEq/L (ref 96–112)
Creatinine, Ser: 0.69 mg/dL (ref 0.50–1.10)
GFR calc Af Amer: >90 mL/min (ref >90)
GFR calc non Af Amer: >90 mL/min (ref >90)
Glucose, Bld: 92 mg/dL (ref 70–99)
Potassium: 3.6 mEq/L (ref 3.5–5.1)
Sodium: 140 mEq/L (ref 135–145)

## 2013-05-01 MED ORDER — ASPIRIN EC 81 MG PO TBEC
81.0000 mg | DELAYED_RELEASE_TABLET | Freq: Every day | ORAL | Status: DC
  Filled 2013-05-01: qty 1

## 2013-05-01 MED ORDER — REGADENOSON 0.4 MG/5ML IV SOLN
INTRAVENOUS | Status: AC
  Administered 2013-05-01: 0.4 mg
  Filled 2013-05-01: qty 5

## 2013-05-01 MED ORDER — TECHNETIUM TC 99M SESTAMIBI GENERIC - CARDIOLITE
10.0000 | Freq: Once | INTRAVENOUS | Status: AC | PRN
  Administered 2013-05-01: 10 via INTRAVENOUS

## 2013-05-01 MED ORDER — AMLODIPINE BESYLATE 2.5 MG PO TABS: 2.5000 mg | ORAL_TABLET | Freq: Every day | ORAL | Status: DC

## 2013-05-01 MED ORDER — CARVEDILOL 3.125 MG PO TABS
3.1250 mg | ORAL_TABLET | Freq: Two times a day (BID) | ORAL | Status: DC
  Filled 2013-05-01 (×2): qty 1

## 2013-05-01 MED ORDER — TECHNETIUM TC 99M SESTAMIBI - CARDIOLITE
30.0000 | Freq: Once | INTRAVENOUS | Status: AC | PRN
  Administered 2013-05-01: 30 via INTRAVENOUS

## 2013-05-01 MED ORDER — CARVEDILOL 3.125 MG PO TABS: 3.1250 mg | ORAL_TABLET | Freq: Two times a day (BID) | ORAL | Status: DC

## 2013-05-01 MED ORDER — ASPIRIN EC 81 MG PO TBEC: 81.0000 mg | DELAYED_RELEASE_TABLET | Freq: Every day | ORAL | Status: DC

## 2013-05-01 NOTE — Progress Notes (Signed)
Utilization Review Completed.   Elaine Middleton, RN, BSN Nurse Case Manager  

## 2013-05-01 NOTE — Progress Notes (Signed)
Subjective: Mild CP which move to abdomen  Objective: Vital signs in last 24 hours: Temp:  [97.7 F (36.5 C)-98.7 F (37.1 C)] 97.8 F (36.6 C) (11/29 0740) Pulse Rate:  [58-89] 76 (11/29 0740) Resp:  [13-27] 27 (11/29 0740) BP: (87-128)/(38-92) 114/55 mmHg (11/29 0740) SpO2:  [91 %-98 %] 96 % (11/29 0740) Weight:  [160 lb (72.576 kg)-164 lb 3.9 oz (74.5 kg)] 164 lb 3.9 oz (74.5 kg) (11/29 0400) Last BM Date: 04/30/13  Intake/Output from previous day: 11/28 0701 - 11/29 0700 In: 30 [I.V.:30] Out: -  Intake/Output this shift:    Medications Current Facility-Administered Medications  Medication Dose Route Frequency Provider Last Rate Last Dose  . 0.9 %  sodium chloride infusion  250 mL Intravenous PRN Chrystie Nose, MD      . 0.9 %  sodium chloride infusion   Intravenous Continuous Chrystie Nose, MD 10 mL/hr at 04/30/13 2237 10 mL/hr at 04/30/13 2237  . acetaminophen (TYLENOL) tablet 650 mg  650 mg Oral Q4H PRN Chrystie Nose, MD      . alum & mag hydroxide-simeth (MAALOX/MYLANTA) 200-200-20 MG/5ML suspension 30 mL  30 mL Oral Q4H PRN Chrystie Nose, MD   30 mL at 05/01/13 0532  . amLODipine (NORVASC) tablet 2.5 mg  2.5 mg Oral Daily Chrystie Nose, MD      . aspirin chewable tablet 81 mg  81 mg Oral Daily Chrystie Nose, MD   81 mg at 04/30/13 2235  . clonazePAM (KLONOPIN) tablet 2 mg  2 mg Oral TID PRN Chrystie Nose, MD      . heparin injection 5,000 Units  5,000 Units Subcutaneous Q8H Chrystie Nose, MD   5,000 Units at 05/01/13 0513  . HYDROmorphone (DILAUDID) injection 1 mg  1 mg Intravenous Q4H PRN Chrystie Nose, MD      . metoprolol succinate (TOPROL-XL) 24 hr tablet 25 mg  25 mg Oral Daily Chrystie Nose, MD      . nitroGLYCERIN (NITROSTAT) SL tablet 0.4 mg  0.4 mg Sublingual Q5 min PRN Chrystie Nose, MD      . nitroGLYCERIN (NITROSTAT) SL tablet 0.4 mg  0.4 mg Sublingual Q5 Min x 3 PRN Chrystie Nose, MD      . nitroGLYCERIN 0.2 mg/mL in  dextrose 5 % infusion  3-30 mcg/min Intravenous Titrated Chrystie Nose, MD 0.9 mL/hr at 05/01/13 0600 3 mcg/min at 05/01/13 0600  . omega-3 acid ethyl esters (LOVAZA) capsule 1 g  1 g Oral BID Chrystie Nose, MD   1 g at 04/30/13 2235  . ondansetron (ZOFRAN) injection 4 mg  4 mg Intravenous Q6H PRN Chrystie Nose, MD      . pantoprazole (PROTONIX) EC tablet 40 mg  40 mg Oral Daily Chrystie Nose, MD      . prasugrel (EFFIENT) tablet 10 mg  10 mg Oral Daily Chrystie Nose, MD      . simvastatin (ZOCOR) tablet 20 mg  20 mg Oral q1800 Chrystie Nose, MD      . sodium chloride 0.9 % injection 3 mL  3 mL Intravenous Q12H Chrystie Nose, MD   3 mL at 04/30/13 2235  . sodium chloride 0.9 % injection 3 mL  3 mL Intravenous PRN Chrystie Nose, MD      . technetium sestamibi (CARDIOLITE) injection 30 milli Curie  30 milli Curie Intravenous Once PRN Medication Radiologist, MD      .  tiotropium (SPIRIVA) inhalation capsule 18 mcg  18 mcg Inhalation Daily Chrystie Nose, MD      . traMADol Janean Sark) tablet 50 mg  50 mg Oral Q6H PRN Chrystie Nose, MD        PE: General appearance: alert, cooperative and no distress Lungs: clear to auscultation bilaterally Heart: regular rate and rhythm, S1, S2 normal, no murmur, click, rub or gallop Abdomen:  +BS.  Nontender Extremities: No LEE Pulses: 2+ and symmetric Skin: Warm and dry Neurologic: Grossly normal  Lab Results:   Recent Labs  04/30/13 0945 05/01/13 0019 05/01/13 0400  WBC 5.4 7.7 5.6  HGB 14.4 13.3 12.9  HCT 42.5 39.3 38.7  PLT 187 174 176   BMET  Recent Labs  04/30/13 0945 05/01/13 0019 05/01/13 0400  NA 141  --  140  K 3.8  --  3.6  CL 105  --  103  CO2 27  --  29  GLUCOSE 134*  --  92  BUN 12  --  15  CREATININE 0.60 0.79 0.69  CALCIUM 9.5  --  8.8   PT/INR No results found for this basename: LABPROT, INR,  in the last 72 hours Cholesterol  Recent Labs  05/01/13 0400  CHOL 142   Lipid Panel       Component Value Date/Time   CHOL 142 05/01/2013 0400   TRIG 163* 05/01/2013 0400   HDL 26* 05/01/2013 0400   CHOLHDL 5.5 05/01/2013 0400   VLDL 33 05/01/2013 0400   LDLCALC 83 05/01/2013 0400    Cardiac Panel (last 3 results)  Recent Labs  05/01/13 0019  TROPONINI <0.30    Assessment/Plan  Principal Problem:   Atypical chest pain Active Problems:   DYSLIPIDEMIA   ANXIETY   NICOTINE ADDICTION - Tobacco Abuse, long standing   CAD -S/P MI-PCI AVG 5/13 then staged DES to RCA  11/06/11. ISR- PCI 11/19/12, cath 02/18/13- no ISR, + spasm   Coronary artery spasm  Plan:  No MI.  She tolerated the stress test well.  Results to follow.  She reports severely decreased energy level for about a month now.  O2 sats decreased to 88% with ambulation and while off O2.  Likely related to tobacco use and emphysema changes to lungs.  BP and HR stable.   LOS: 1 day    HAGER, BRYAN 05/01/2013 8:00 AM  I have seen and examined the patient along with Wilburt Finlay, PA.  I have reviewed the chart, notes and new data.  I agree with PA's note.  PLAN: Suspect continued coronary vasospasm issues, but cannot exclude in-stent restenosis (3 relatively small caliber stents inserted in last 6 months, total 44 mm length, albeit drug-eluting).  Smoking cessation critical to her improvement. Switch metoprolol to carvedilol for potential benefit as vasodilator. Review plan of care after we get stress test results.  Thurmon Fair, MD, [facility] [phone] 05/01/2013, 9:33 AM

## 2013-05-03 NOTE — Discharge Summary (Signed)
Physician Discharge Summary     Patient ID: Michele [name] MRN: [id number] DOB/AGE: [id number] 59 y.o.  Admit date: 04/30/2013 Discharge date: 05/03/2013  Admission Diagnoses:  Chest Pain, atypical  Discharge Diagnoses:  Principal Problem:   Atypical chest pain Active Problems:   DYSLIPIDEMIA   ANXIETY   NICOTINE ADDICTION - Tobacco Abuse, long standing   CAD -S/P MI-PCI AVG 5/13 then staged DES to RCA  11/06/11. ISR- PCI 11/19/12, cath 02/18/13- no ISR, + spasm   Coronary artery spasm   Discharged Condition: stable  Hospital Course:  This is a 57 y.o. female with a past medical history significant for coronary disease. She had an inferior MI in May of 2013 treated with a circumflex DES. She had some residual disease in the RCA that was intervened on in a staged fashion in June 2013. She had a Myoview in Oct 2013 that was negative for ischemia but she had a hypotensive response. Her condition is complicated by severe anxiety. She was admitted through the ER 11/18/12 with complaints of chest and SOB. Her Troponin and EKG were normal but she continued to have chest and it was decided to proceed with diagnostic cath. This was done 11/19/12 and revealed ISR of the AV groove stent at the bifurcation of the OM3. This was intervened on with a DES by Dr [name] 11/19/12. She was seen in the hospital in consult 02/19/13 for chest pain. She was taken to the lab by 02/18/13 by Dr Tresa Endo. Cath revealed patent stents, with some vasospasm noted. She continues to smoke but has cut down to 1/2 pk a day or less. She does reports that she has not taken her nitroglycerin at home because she's afraid of what it might do to her. She reported left-sided chest pain for 12-16 hours with no particular relief. She said it was sharp and located under the left breast. It did not radiate to the shoulder or jaw. She had no assocated diaphoresis or nausea. She reportedly was given morphine in the emergency room which caused a  rash that resolved within 10 minutes. She's not been given nitroglycerin. She also reports being extremely fatigued, and is concerned that this is also related to her heart. Her EKG shows no acute changes concerning for ischemia. Troponin is negative x2.   She was admitted and ruled out for MI.  Lexiscan myoview revealed no induced ischemia and and EF of 70%. Metoprolol was changed to coreg.  Her O2 levels decreased to 88% while ambulating so she was put on O2 which promptly improved.  Smoking cessation was discussed.  The patient was seen by Dr. Royann Shivers who felt she was stable for DC home.   Consults: None  Significant Diagnostic Studies:  EXAM: MYOCARDIAL IMAGING WITH SPECT (REST AND PHARMACOLOGIC-STRESS)  GATED LEFT VENTRICULAR WALL MOTION STUDY  LEFT VENTRICULAR EJECTION FRACTION  TECHNIQUE: Standard myocardial SPECT imaging was performed after resting intravenous injection of 10 mCi Tc-14m sestamibi. Subsequently, intravenous infusion of Lexiscan was performed under the supervision of the Cardiology staff. At peak effect of the drug, 30 mCi Tc-24m sestamibi was injected intravenously and standard myocardial SPECT imaging was performed. Quantitative gated imaging was also performed to evaluate left ventricular wall motion, and estimate left ventricular ejection fraction.  COMPARISON: Chest radiograph -04/30/2013  FINDINGS: Review of the rotational raw images demonstrates moderate breast attenuation on both the provided rest and stress images. There is mild patient motion artifact in GI attenuation on both the rest and stress images.  SPECT imaging demonstrates minimal amount of attenuation involving the inferior wall and left ventricular apex which improves on the provided stress images and is without associated regional wall motion abnormality. There is no definitive scintigraphic evidence of prior infarction or pharmacologically induced ischemia.  Quantitative gated  analysis shows normal wall motion.  The resting left ventricular ejection fraction is 70% with end-diastolic volume of 56 ml and end-systolic volume of 17 ml.  IMPRESSION: 1. Mild attenuation involving the inferior wall extending towards the left ventricular apex which improves on the provided stress images and is without associated regional wall motion abnormality. No definite scintigraphic evidence of prior infarction or pharmacologically induced ischemia. 2. Normal wall motion. Ejection fraction - 70%.   Treatments: See above  Discharge Exam: Blood pressure 93/26, pulse 67, temperature 98.2 F (36.8 C), temperature source Oral, resp. rate 14, height 5\' 5"  (1.651 m), weight 164 lb 3.9 oz (74.5 kg), SpO2 98.00%.   Disposition: 01-Home or Self Care  Discharge Orders   Future Appointments Provider Department Dept Phone   05/24/2013 2:30 PM Marykay Lex, MD [facility] [phone]   Future Orders Complete By Expires   Diet - low sodium heart healthy  As directed    Increase activity slowly  As directed        Medication List    STOP taking these medications       metoprolol succinate 25 MG 24 hr tablet  Commonly known as:  TOPROL XL      TAKE these medications       amLODipine 2.5 MG tablet  Commonly known as:  NORVASC  Take 1 tablet (2.5 mg total) by mouth daily.     aspirin 81 MG EC tablet  Take 81 mg by mouth every evening. Swallow whole.     CALCIUM 600+D PO  Take 1 tablet by mouth 2 (two) times daily.     carvedilol 3.125 MG tablet  Commonly known as:  COREG  Take 1 tablet (3.125 mg total) by mouth 2 (two) times daily with a meal.     clonazePAM 2 MG tablet  Commonly known as:  KLONOPIN  Take 2 mg by mouth 3 (three) times daily as needed for anxiety.     CoQ10 100 MG Caps  Take 1 tablet by mouth daily.     isosorbide mononitrate 30 MG 24 hr tablet  Commonly known as:  IMDUR  Take 30 mg by mouth at bedtime.     NITROSTAT 0.4 MG SL  tablet  Generic drug:  nitroGLYCERIN  Place 0.4 mg under the tongue every 5 (five) minutes as needed for chest pain.     omega-3 acid ethyl esters 1 G capsule  Commonly known as:  LOVAZA  Take 1 g by mouth 2 (two) times daily.     omeprazole 20 MG capsule  Commonly known as:  PRILOSEC  Take 20 mg by mouth daily.     prasugrel 10 MG Tabs tablet  Commonly known as:  EFFIENT  Take 10 mg by mouth daily.     pravastatin 20 MG tablet  Commonly known as:  PRAVACHOL  Take 20 mg by mouth at bedtime.     tiotropium 18 MCG inhalation capsule  Commonly known as:  SPIRIVA  Place 18 mcg into inhaler and inhale daily.           Follow-up Information   Follow up with Quintella Mura, PA-C. (Our office will call you with the appt date and time.)  Specialty:  Physician Assistant   Contact information:   [address] [place] [address] [phone]       Signed: Wilburt Finlay 05/03/2013, 9:35 AM

## 2013-05-21 ENCOUNTER — Other Ambulatory Visit (HOSPITAL_COMMUNITY): Payer: Self-pay | Admitting: Cardiology

## 2013-05-21 DIAGNOSIS — F17200 Nicotine dependence, unspecified, uncomplicated: Secondary | ICD-10-CM | POA: Diagnosis not present

## 2013-05-21 DIAGNOSIS — J441 Chronic obstructive pulmonary disease with (acute) exacerbation: Secondary | ICD-10-CM | POA: Diagnosis not present

## 2013-05-21 DIAGNOSIS — R002 Palpitations: Secondary | ICD-10-CM | POA: Diagnosis not present

## 2013-05-21 NOTE — Telephone Encounter (Signed)
Rx was sent to pharmacy electronically. 

## 2013-05-24 ENCOUNTER — Encounter: Payer: Self-pay | Admitting: Cardiology

## 2013-05-24 ENCOUNTER — Ambulatory Visit (INDEPENDENT_AMBULATORY_CARE_PROVIDER_SITE_OTHER): Payer: Medicare Other | Admitting: Cardiology

## 2013-05-24 VITALS — BP 150/70 | HR 80 | Ht 65.5 in | Wt 158.9 lb

## 2013-05-24 DIAGNOSIS — F17200 Nicotine dependence, unspecified, uncomplicated: Secondary | ICD-10-CM

## 2013-05-24 DIAGNOSIS — Z9861 Coronary angioplasty status: Secondary | ICD-10-CM

## 2013-05-24 DIAGNOSIS — F411 Generalized anxiety disorder: Secondary | ICD-10-CM

## 2013-05-24 DIAGNOSIS — E785 Hyperlipidemia, unspecified: Secondary | ICD-10-CM

## 2013-05-24 DIAGNOSIS — I201 Angina pectoris with documented spasm: Secondary | ICD-10-CM

## 2013-05-24 DIAGNOSIS — I2510 Atherosclerotic heart disease of native coronary artery without angina pectoris: Secondary | ICD-10-CM | POA: Diagnosis not present

## 2013-05-24 DIAGNOSIS — I209 Angina pectoris, unspecified: Secondary | ICD-10-CM | POA: Diagnosis not present

## 2013-05-24 DIAGNOSIS — J209 Acute bronchitis, unspecified: Secondary | ICD-10-CM

## 2013-05-24 DIAGNOSIS — I10 Essential (primary) hypertension: Secondary | ICD-10-CM

## 2013-05-24 IMAGING — CR DG ANKLE COMPLETE 3+V*L*
3 series · 3 of 3 positions shown · non-contrast
Comparison: None.

CLINICAL DATA: Fall.  Ankle pain.

LEFT ANKLE COMPLETE - 3+ VIEW

[view not recorded (1 of 3)]
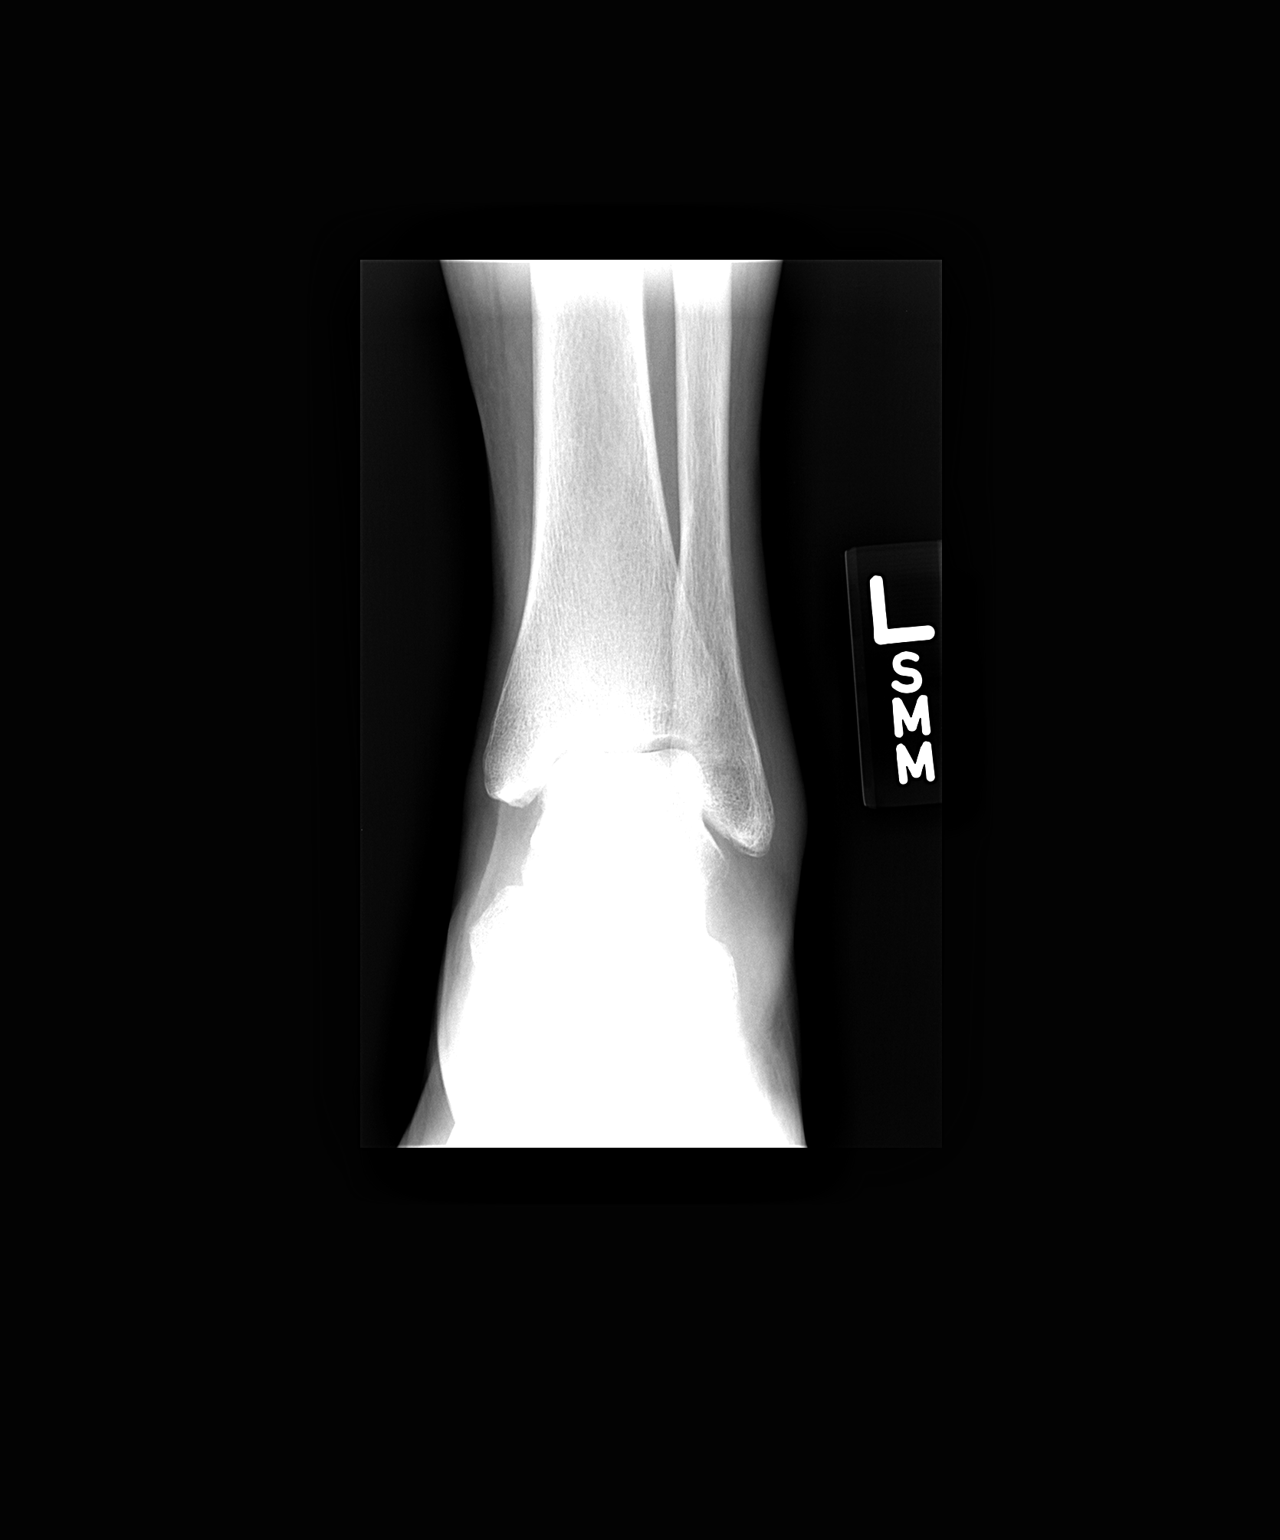

[view not recorded (2 of 3)]
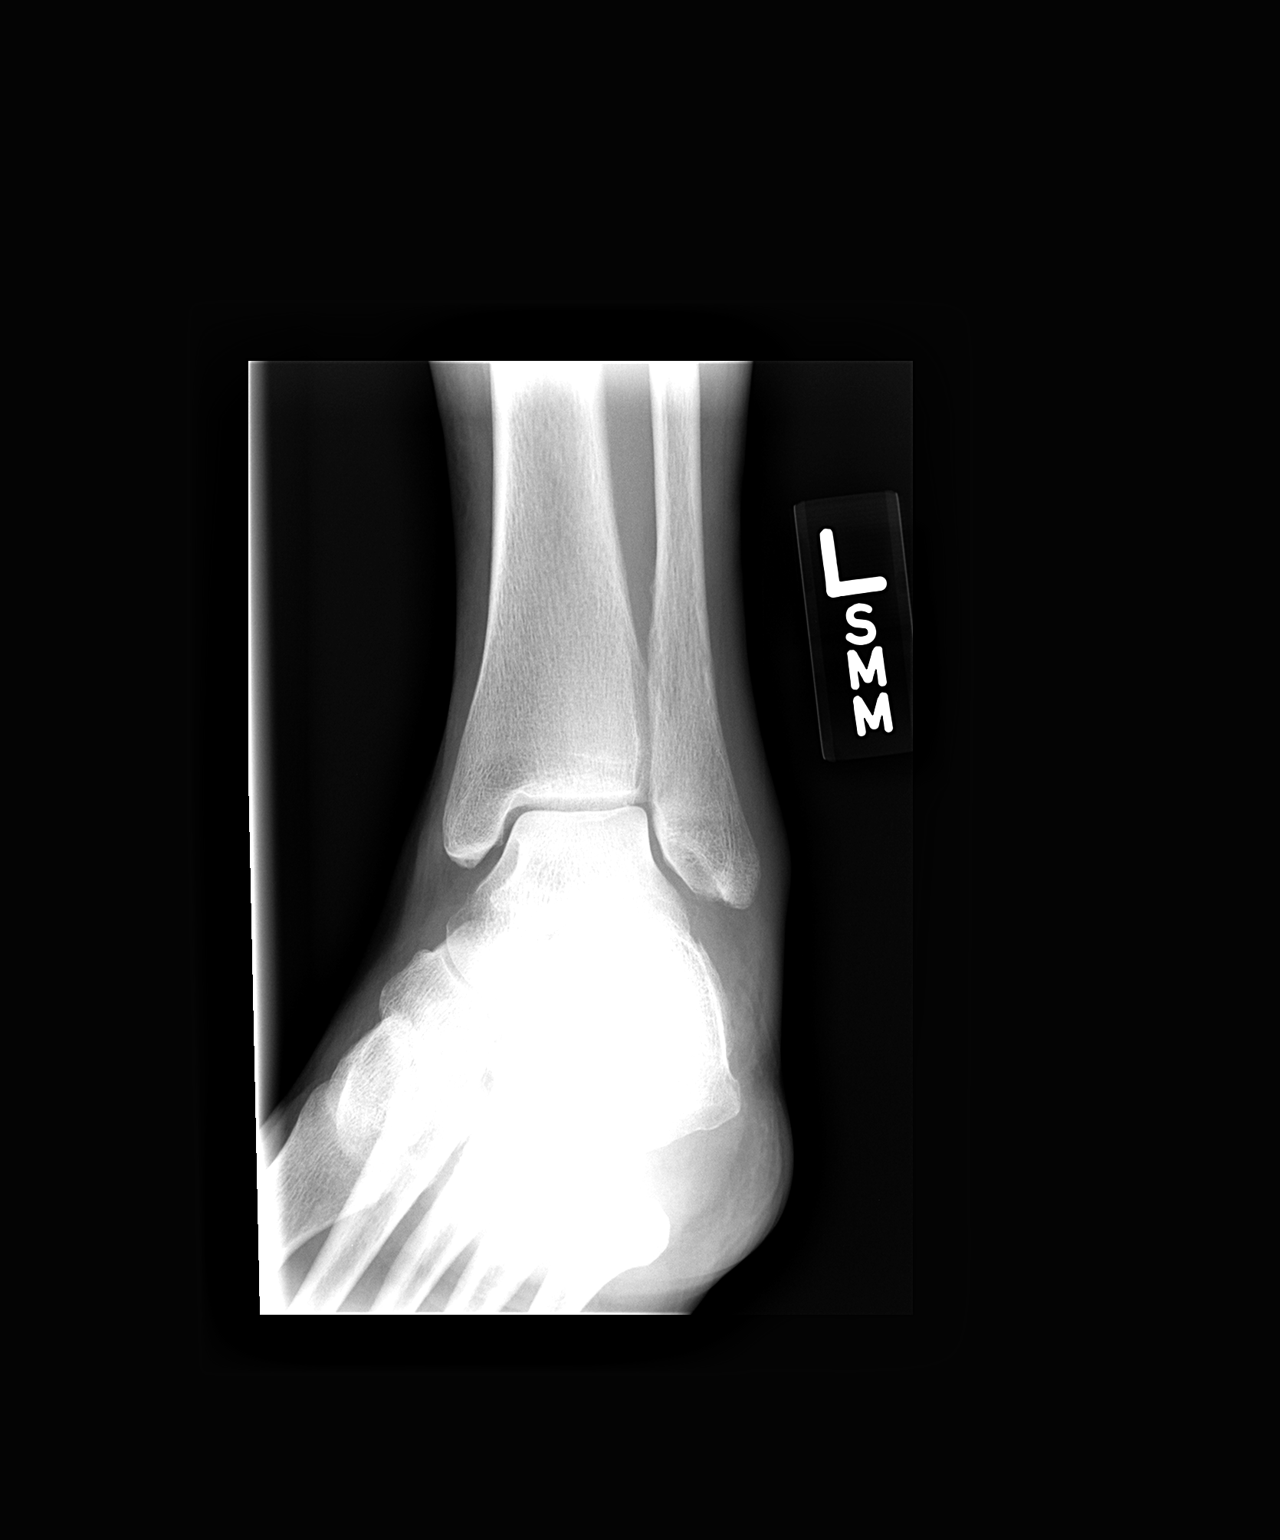

[view not recorded (3 of 3)]
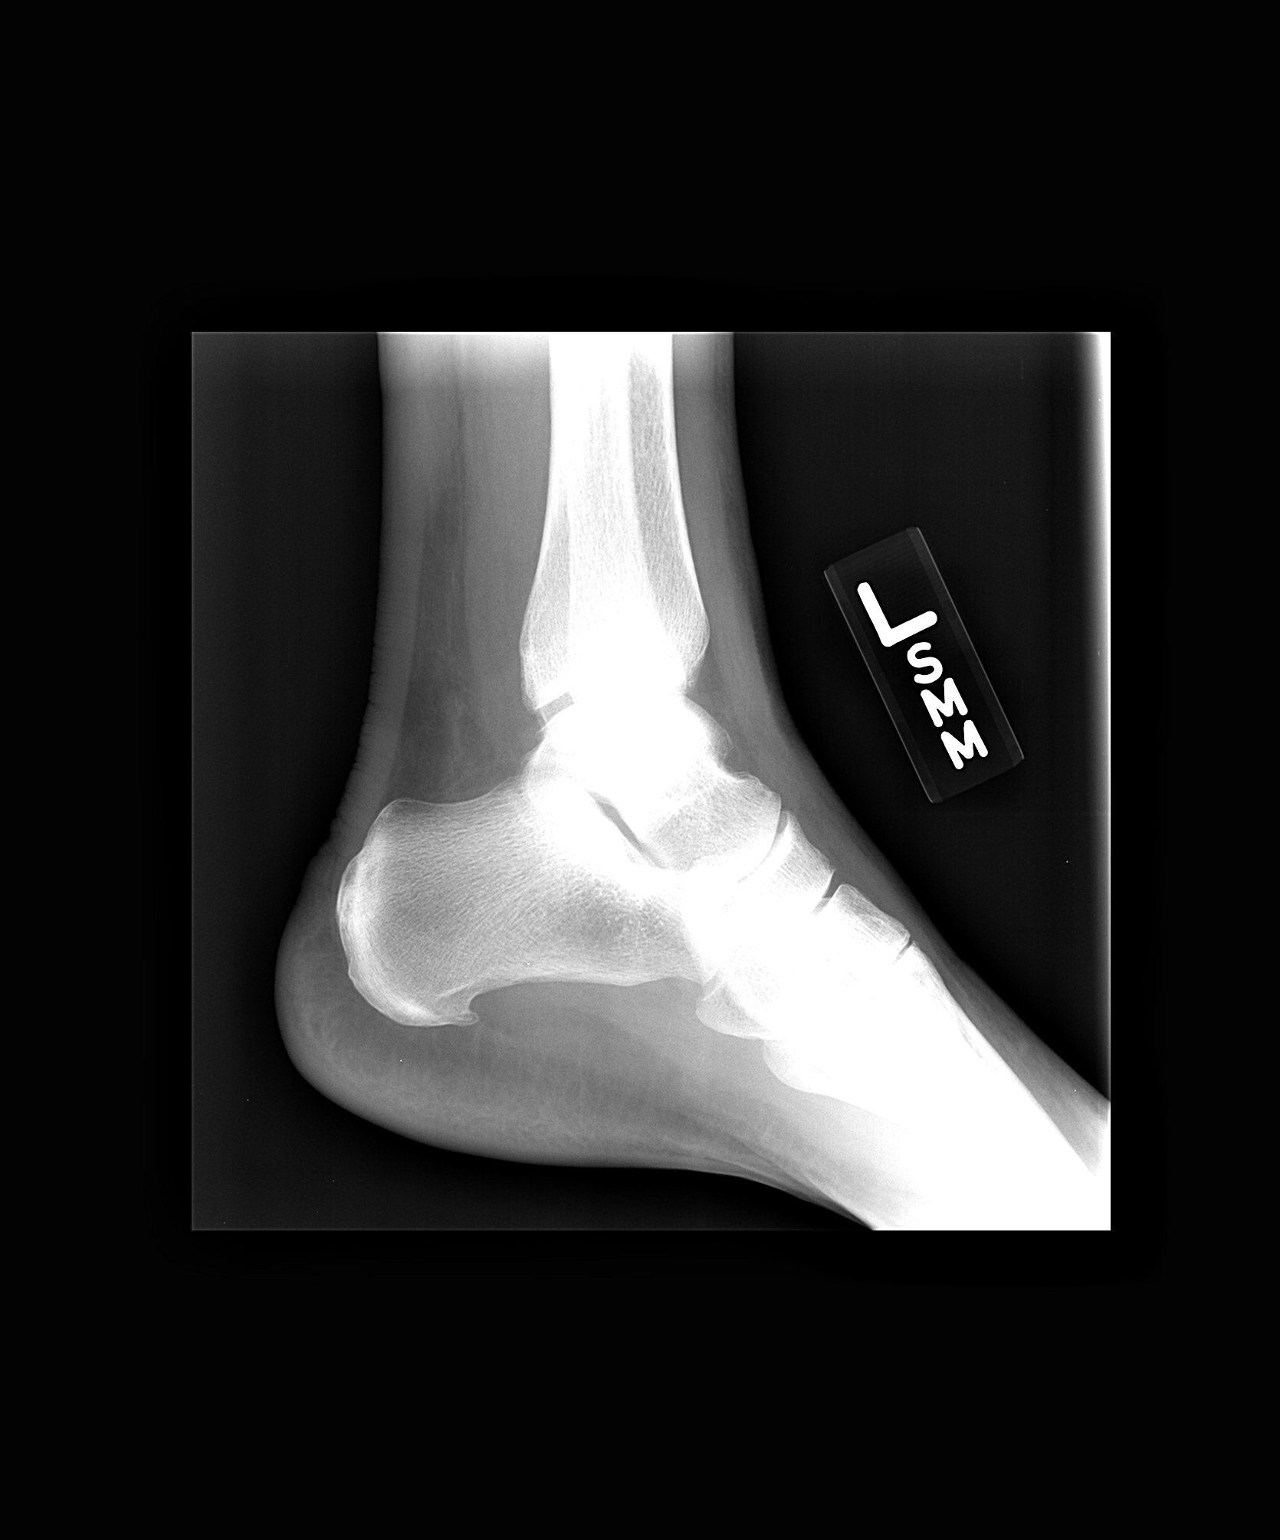

[3 of 3 positions shown; findings below may reference images not displayed]

FINDINGS: There is no evidence for fracture, subluxation or
dislocation.  No worrisome lytic or sclerotic osseous lesion.
IMPRESSION: No acute bony abnormality.

## 2013-05-24 NOTE — Patient Instructions (Addendum)
I do want you to use the Nicotrol inhalers - rather have you use them then smoking.  Your BP is up some today, which is unusual for you.  I plan to leave your medications as is.    I will see you back in 6 months- 30 min       - at that time, we can consider changing from Effient + Aspirin to Plavix.  Marykay Lex, MD
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
## 2013-05-28 ENCOUNTER — Other Ambulatory Visit: Payer: Self-pay | Admitting: Cardiology

## 2013-05-28 DIAGNOSIS — J449 Chronic obstructive pulmonary disease, unspecified: Secondary | ICD-10-CM | POA: Diagnosis not present

## 2013-05-30 DIAGNOSIS — I10 Essential (primary) hypertension: Secondary | ICD-10-CM | POA: Insufficient documentation

## 2013-05-30 NOTE — Assessment & Plan Note (Signed)
Relatively well controlled. HDL is a little below and LDL is a little time. Continue pravastatin. May need to consider adding Niaspan or omega-3 fatty acids.

## 2013-05-30 NOTE — Assessment & Plan Note (Signed)
Her pressures are little elevated today. This is in the setting of ongoing illness. She previously has had lower blood pressures and is only on minimal doses of calcitriol blocker and beta blocker. We'll simply monitor for now.

## 2013-05-30 NOTE — Assessment & Plan Note (Addendum)
Now she is had a cath in September there is relatively normal and a stress test in the end of November/early December that was also negative for ischemia. She remains on stable therapy with aspirin, Effient they'll be continued for at least one year post June 2014 stent. She is on low-dose beta blocker. I have had her on Toprol to avoid pulmonary overlap, but carvedilol is acceptable and potentially better beta blocker for cardiac etiology. She is also on low-dose amlodipine for anti-anginal/spasm treatment in addition to Imdur.

## 2013-05-30 NOTE — Assessment & Plan Note (Signed)
On amlodipine plus Imdur. Also beta blockers switched from metoprolol to carvedilol for alpha blocker effect.

## 2013-05-30 NOTE — Assessment & Plan Note (Signed)
Most of her symptoms that she is describing of late can be related to this condition.

## 2013-05-30 NOTE — Assessment & Plan Note (Addendum)
Somehow after finally getting her Nicotrol inhalers, she felt like she was told not to use his inhalers. I am not sure why that happened but I want her to use the Nicotrol inhalers if this will help her quit smoking. I think this is set her up for coronary disease prematurely as well as this chronic intermittent bronchitis which are mild exacerbations of her COPD.

## 2013-05-30 NOTE — Assessment & Plan Note (Signed)
Continue with when necessary Klonopin. I honestly feel that she would benefit from the use of long-term SSRI, but will defer to primary physician.

## 2013-05-31 NOTE — Telephone Encounter (Signed)
Rx was sent to pharmacy electronically. 

## 2013-06-06 ENCOUNTER — Encounter (HOSPITAL_COMMUNITY): Payer: Self-pay | Admitting: Emergency Medicine

## 2013-06-06 ENCOUNTER — Emergency Department (HOSPITAL_COMMUNITY): Payer: Medicare Other

## 2013-06-06 ENCOUNTER — Emergency Department (HOSPITAL_COMMUNITY)
Admission: EM | Admit: 2013-06-06 | Discharge: 2013-06-07 | Disposition: A | Discharge status: Home / Self Care | Payer: Medicare Other | Attending: Emergency Medicine | Admitting: Emergency Medicine

## 2013-06-06 DIAGNOSIS — N281 Cyst of kidney, acquired: Secondary | ICD-10-CM

## 2013-06-06 DIAGNOSIS — K5900 Constipation, unspecified: Secondary | ICD-10-CM | POA: Insufficient documentation

## 2013-06-06 DIAGNOSIS — F329 Major depressive disorder, single episode, unspecified: Secondary | ICD-10-CM | POA: Insufficient documentation

## 2013-06-06 DIAGNOSIS — E559 Vitamin D deficiency, unspecified: Secondary | ICD-10-CM | POA: Diagnosis not present

## 2013-06-06 DIAGNOSIS — G2581 Restless legs syndrome: Secondary | ICD-10-CM | POA: Diagnosis not present

## 2013-06-06 DIAGNOSIS — I10 Essential (primary) hypertension: Secondary | ICD-10-CM | POA: Diagnosis not present

## 2013-06-06 DIAGNOSIS — K7689 Other specified diseases of liver: Secondary | ICD-10-CM | POA: Diagnosis not present

## 2013-06-06 DIAGNOSIS — Z79899 Other long term (current) drug therapy: Secondary | ICD-10-CM | POA: Diagnosis not present

## 2013-06-06 DIAGNOSIS — J441 Chronic obstructive pulmonary disease with (acute) exacerbation: Secondary | ICD-10-CM | POA: Diagnosis not present

## 2013-06-06 DIAGNOSIS — Z872 Personal history of diseases of the skin and subcutaneous tissue: Secondary | ICD-10-CM | POA: Diagnosis not present

## 2013-06-06 DIAGNOSIS — F3289 Other specified depressive episodes: Secondary | ICD-10-CM | POA: Diagnosis not present

## 2013-06-06 DIAGNOSIS — I252 Old myocardial infarction: Secondary | ICD-10-CM | POA: Diagnosis not present

## 2013-06-06 DIAGNOSIS — K219 Gastro-esophageal reflux disease without esophagitis: Secondary | ICD-10-CM | POA: Insufficient documentation

## 2013-06-06 DIAGNOSIS — K760 Fatty (change of) liver, not elsewhere classified: Secondary | ICD-10-CM

## 2013-06-06 DIAGNOSIS — Z7982 Long term (current) use of aspirin: Secondary | ICD-10-CM | POA: Diagnosis not present

## 2013-06-06 DIAGNOSIS — R059 Cough, unspecified: Secondary | ICD-10-CM | POA: Diagnosis not present

## 2013-06-06 DIAGNOSIS — F411 Generalized anxiety disorder: Secondary | ICD-10-CM | POA: Diagnosis not present

## 2013-06-06 DIAGNOSIS — Z9861 Coronary angioplasty status: Secondary | ICD-10-CM | POA: Diagnosis not present

## 2013-06-06 DIAGNOSIS — I2510 Atherosclerotic heart disease of native coronary artery without angina pectoris: Secondary | ICD-10-CM | POA: Insufficient documentation

## 2013-06-06 DIAGNOSIS — Z8739 Personal history of other diseases of the musculoskeletal system and connective tissue: Secondary | ICD-10-CM | POA: Insufficient documentation

## 2013-06-06 DIAGNOSIS — G8929 Other chronic pain: Secondary | ICD-10-CM | POA: Diagnosis not present

## 2013-06-06 DIAGNOSIS — F17200 Nicotine dependence, unspecified, uncomplicated: Secondary | ICD-10-CM | POA: Insufficient documentation

## 2013-06-06 DIAGNOSIS — E785 Hyperlipidemia, unspecified: Secondary | ICD-10-CM | POA: Insufficient documentation

## 2013-06-06 DIAGNOSIS — R05 Cough: Secondary | ICD-10-CM | POA: Diagnosis not present

## 2013-06-06 DIAGNOSIS — R1013 Epigastric pain: Secondary | ICD-10-CM | POA: Diagnosis not present

## 2013-06-06 DIAGNOSIS — K449 Diaphragmatic hernia without obstruction or gangrene: Secondary | ICD-10-CM | POA: Diagnosis not present

## 2013-06-06 DIAGNOSIS — Z87828 Personal history of other (healed) physical injury and trauma: Secondary | ICD-10-CM | POA: Insufficient documentation

## 2013-06-06 LAB — BASIC METABOLIC PANEL
BUN: 18 mg/dL (ref 6–23)
CO2: 28 mEq/L (ref 19–32)
Calcium: 9.3 mg/dL (ref 8.4–10.5)
Chloride: 103 mEq/L (ref 96–112)
Creatinine, Ser: 0.62 mg/dL (ref 0.50–1.10)
GFR calc Af Amer: >90 mL/min (ref >90)
GFR calc non Af Amer: >90 mL/min (ref >90)
Glucose, Bld: 111 mg/dL — ABNORMAL HIGH (ref 70–99)
Potassium: 4 mEq/L (ref 3.7–5.3)
Sodium: 141 mEq/L (ref 137–147)

## 2013-06-06 LAB — POCT I-STAT TROPONIN I: Troponin i, poc: 0 ng/mL (ref 0.00–0.08)

## 2013-06-06 LAB — CBC
HCT: 41.4 % (ref 36.0–46.0)
Hemoglobin: 14 g/dL (ref 12.0–15.0)
MCH: 29.7 pg (ref 26.0–34.0)
MCHC: 33.8 g/dL (ref 30.0–36.0)
MCV: 87.7 fL (ref 78.0–100.0)
Platelets: 189 10*3/uL (ref 150–400)
RBC: 4.72 MIL/uL (ref 3.87–5.11)
RDW: 13.3 % (ref 11.5–15.5)
WBC: 6 10*3/uL (ref 4.0–10.5)

## 2013-06-06 LAB — HEPATIC FUNCTION PANEL
ALT: 16 U/L (ref 0–35)
AST: 16 U/L (ref 0–37)
Albumin: 3.7 g/dL (ref 3.5–5.2)
Alkaline Phosphatase: 70 U/L (ref 39–117)
Bilirubin, Direct: <0.2 mg/dL (ref 0.0–0.3)
Total Bilirubin: 0.3 mg/dL (ref 0.3–1.2)
Total Protein: 7 g/dL (ref 6.0–8.3)

## 2013-06-06 LAB — LIPASE, BLOOD: Lipase: 33 U/L (ref 11–59)

## 2013-06-06 MED ORDER — FAMOTIDINE IN NACL 20-0.9 MG/50ML-% IV SOLN
20.0000 mg | Freq: Once | INTRAVENOUS | Status: AC
  Administered 2013-06-07: 20 mg via INTRAVENOUS
  Filled 2013-06-06: qty 50

## 2013-06-06 MED ORDER — IOHEXOL 300 MG/ML  SOLN: 25.0000 mL | Freq: Once | INTRAMUSCULAR | Status: AC | PRN

## 2013-06-06 MED ORDER — GI COCKTAIL ~~LOC~~
30.0000 mL | Freq: Once | ORAL | Status: AC
  Administered 2013-06-06: 30 mL via ORAL
  Filled 2013-06-06: qty 30

## 2013-06-06 MED ORDER — MORPHINE SULFATE 4 MG/ML IJ SOLN
4.0000 mg | Freq: Once | INTRAMUSCULAR | Status: AC
  Administered 2013-06-07: 4 mg via INTRAVENOUS
  Filled 2013-06-06: qty 1

## 2013-06-06 MED ORDER — FENTANYL CITRATE 0.05 MG/ML IJ SOLN
25.0000 ug | Freq: Once | INTRAMUSCULAR | Status: AC
  Administered 2013-06-06: 25 ug via INTRAVENOUS
  Filled 2013-06-06: qty 2

## 2013-06-06 MED ORDER — ONDANSETRON HCL 4 MG/2ML IJ SOLN
4.0000 mg | Freq: Once | INTRAMUSCULAR | Status: AC
  Administered 2013-06-06: 4 mg via INTRAVENOUS
  Filled 2013-06-06: qty 2

## 2013-06-06 MED ORDER — SODIUM CHLORIDE 0.9 % IV BOLUS (SEPSIS)
2000.0000 mL | Freq: Once | INTRAVENOUS | Status: AC
  Administered 2013-06-07: 2000 mL via INTRAVENOUS
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
## 2013-06-06 NOTE — ED Notes (Signed)
Pt in c/o abdominal pain, cough and chest pain over the last few days, worse today, chest is tender to palpation, also jaw pain and jaw is tender to palpation, denies fever at home

## 2013-06-06 NOTE — ED Notes (Addendum)
Pt stated she had upper respiratory weeks before christmas and was placed on antibiotic and was given steroid injection. Called PCP on call that her chew, neck and stomach pain and was told to go to ER.  Stated any time she eat and her stomach will hurt bad. Denies any N/V.

## 2013-06-07 ENCOUNTER — Emergency Department (HOSPITAL_COMMUNITY): Payer: Medicare Other

## 2013-06-07 ENCOUNTER — Encounter (HOSPITAL_COMMUNITY): Payer: Self-pay | Admitting: Radiology

## 2013-06-07 DIAGNOSIS — K7689 Other specified diseases of liver: Secondary | ICD-10-CM | POA: Diagnosis not present

## 2013-06-07 DIAGNOSIS — N281 Cyst of kidney, acquired: Secondary | ICD-10-CM | POA: Diagnosis not present

## 2013-06-07 LAB — URINALYSIS, ROUTINE W REFLEX MICROSCOPIC
Bilirubin Urine: NEGATIVE
Glucose, UA: NEGATIVE mg/dL
Ketones, ur: NEGATIVE mg/dL
Leukocytes, UA: NEGATIVE
Nitrite: NEGATIVE
Protein, ur: NEGATIVE mg/dL
Specific Gravity, Urine: 1.01 (ref 1.005–1.030)
Urobilinogen, UA: 0.2 mg/dL (ref 0.0–1.0)
pH: 7 (ref 5.0–8.0)

## 2013-06-07 LAB — POCT I-STAT TROPONIN I: Troponin i, poc: 0 ng/mL (ref 0.00–0.08)

## 2013-06-07 LAB — URINE MICROSCOPIC-ADD ON

## 2013-06-07 MED ORDER — IOHEXOL 300 MG/ML  SOLN
100.0000 mL | Freq: Once | INTRAMUSCULAR | Status: AC | PRN
  Administered 2013-06-07: 100 mL via INTRAVENOUS

## 2013-06-07 MED ORDER — IOHEXOL 300 MG/ML  SOLN
25.0000 mL | Freq: Once | INTRAMUSCULAR | Status: AC | PRN
  Administered 2013-06-07: 25 mL via ORAL

## 2013-06-07 MED ORDER — POLYETHYLENE GLYCOL 3350 17 GM/SCOOP PO POWD: 17.0000 g | Freq: Two times a day (BID) | ORAL | Status: DC

## 2013-06-07 NOTE — ED Notes (Signed)
Returned to room.

## 2013-06-07 NOTE — Discharge Instructions (Signed)
Follow-up with your primary doctor and cardiologist as soon as possible Try miralax for constipation relief  Return to the ED if you have any change/worsening chest pain/abdominal pain, repeated vomiting, blood in your vomit/stool/urine/sputum, fever, difficulty breathing/SOB, or any other concerns (see below) Try to quit smoking - this will help improve your symptoms  Try eating a bland clear liquid diet - continue to use Mylanta as needed      Gastroesophageal Reflux Disease, Adult Gastroesophageal reflux disease (GERD) happens when acid from your stomach flows up into the esophagus. When acid comes in contact with the esophagus, the acid causes soreness (inflammation) in the esophagus. Over time, GERD may create small holes (ulcers) in the lining of the esophagus. CAUSES   Increased body weight. This puts pressure on the stomach, making acid rise from the stomach into the esophagus.  Smoking. This increases acid production in the stomach.  Drinking alcohol. This causes decreased pressure in the lower esophageal sphincter (valve or ring of muscle between the esophagus and stomach), allowing acid from the stomach into the esophagus.  Late evening meals and a full stomach. This increases pressure and acid production in the stomach.  A malformed lower esophageal sphincter. Sometimes, no cause is found. SYMPTOMS   Burning pain in the lower part of the mid-chest behind the breastbone and in the mid-stomach area. This may occur twice a week or more often.  Trouble swallowing.  Sore throat.  Dry cough.  Asthma-like symptoms including chest tightness, shortness of breath, or wheezing. DIAGNOSIS  Your caregiver may be able to diagnose GERD based on your symptoms. In some cases, X-rays and other tests may be done to check for complications or to check the condition of your stomach and esophagus. TREATMENT  Your caregiver may recommend over-the-counter or prescription medicines to help  decrease acid production. Ask your caregiver before starting or adding any new medicines.  HOME CARE INSTRUCTIONS   Change the factors that you can control. Ask your caregiver for guidance concerning weight loss, quitting smoking, and alcohol consumption.  Avoid foods and drinks that make your symptoms worse, such as:  Caffeine or alcoholic drinks.  Chocolate.  Peppermint or mint flavorings.  Garlic and onions.  Spicy foods.  Citrus fruits, such as oranges, lemons, or limes.  Tomato-based foods such as sauce, chili, salsa, and pizza.  Fried and fatty foods.  Avoid lying down for the 3 hours prior to your bedtime or prior to taking a nap.  Eat small, frequent meals instead of large meals.  Wear loose-fitting clothing. Do not wear anything tight around your waist that causes pressure on your stomach.  Raise the head of your bed 6 to 8 inches with wood blocks to help you sleep. Extra pillows will not help.  Only take over-the-counter or prescription medicines for pain, discomfort, or fever as directed by your caregiver.  Do not take aspirin, ibuprofen, or other nonsteroidal anti-inflammatory drugs (NSAIDs). SEEK IMMEDIATE MEDICAL CARE IF:   You have pain in your arms, neck, jaw, teeth, or back.  Your pain increases or changes in intensity or duration.  You develop nausea, vomiting, or sweating (diaphoresis).  You develop shortness of breath, or you faint.  Your vomit is green, yellow, black, or looks like coffee grounds or blood.  Your stool is red, bloody, or black. These symptoms could be signs of other problems, such as heart disease, gastric bleeding, or esophageal bleeding. MAKE SURE YOU:   Understand these instructions.  Will watch your condition.  Will get help right away if you are not doing well or get worse. Document Released: 02/27/2005 Document Revised: 08/12/2011 Document Reviewed: 12/07/2010 [facility] Patient Information 2014 [place], [place].  Chest  Pain (Nonspecific) It is often hard to give a specific diagnosis for the cause of chest pain. There is always a chance that your pain could be related to something serious, such as a heart attack or a blood clot in the lungs. You need to follow up with your caregiver for further evaluation. CAUSES   Heartburn.  Pneumonia or bronchitis.  Anxiety or stress.  Inflammation around your heart (pericarditis) or lung (pleuritis or pleurisy).  A blood clot in the lung.  A collapsed lung (pneumothorax). It can develop suddenly on its own (spontaneous pneumothorax) or from injury (trauma) to the chest.  Shingles infection (herpes zoster virus). The chest wall is composed of bones, muscles, and cartilage. Any of these can be the source of the pain.  The bones can be bruised by injury.  The muscles or cartilage can be strained by coughing or overwork.  The cartilage can be affected by inflammation and become sore (costochondritis). DIAGNOSIS  Lab tests or other studies, such as X-rays, electrocardiography, stress testing, or cardiac imaging, may be needed to find the cause of your pain.  TREATMENT   Treatment depends on what may be causing your chest pain. Treatment may include:  Acid blockers for heartburn.  Anti-inflammatory medicine.  Pain medicine for inflammatory conditions.  Antibiotics if an infection is present.  You may be advised to change lifestyle habits. This includes stopping smoking and avoiding alcohol, caffeine, and chocolate.  You may be advised to keep your head raised (elevated) when sleeping. This reduces the chance of acid going backward from your stomach into your esophagus.  Most of the time, nonspecific chest pain will improve within 2 to 3 days with rest and mild pain medicine. HOME CARE INSTRUCTIONS   If antibiotics were prescribed, take your antibiotics as directed. Finish them even if you start to feel better.  For the next few days, avoid physical  activities that bring on chest pain. Continue physical activities as directed.  Do not smoke.  Avoid drinking alcohol.  Only take over-the-counter or prescription medicine for pain, discomfort, or fever as directed by your caregiver.  Follow your caregiver's suggestions for further testing if your chest pain does not go away.  Keep any follow-up appointments you made. If you do not go to an appointment, you could develop lasting (chronic) problems with pain. If there is any problem keeping an appointment, you must call to reschedule. SEEK MEDICAL CARE IF:   You think you are having problems from the medicine you are taking. Read your medicine instructions carefully.  Your chest pain does not go away, even after treatment.  You develop a rash with blisters on your chest. SEEK IMMEDIATE MEDICAL CARE IF:   You have increased chest pain or pain that spreads to your arm, neck, jaw, back, or abdomen.  You develop shortness of breath, an increasing cough, or you are coughing up blood.  You have severe back or abdominal pain, feel nauseous, or vomit.  You develop severe weakness, fainting, or chills.  You have a fever. THIS IS AN EMERGENCY. Do not wait to see if the pain will go away. Get medical help at once. Call your local emergency services (911 in [place] Do not drive yourself to the hospital. MAKE SURE YOU:   Understand these instructions.  Will watch  your condition.  Will get help right away if you are not doing well or get worse. Document Released: 02/27/2005 Document Revised: 08/12/2011 Document Reviewed: 12/24/2007 [facility] Patient Information 2014 [place].  Abdominal Pain Abdominal pain can be caused by many things. Your caregiver decides the seriousness of your pain by an examination and possibly blood tests and X-rays. Many cases can be observed and treated at home. Most abdominal pain is not caused by a disease and will probably improve without treatment.  However, in many cases, more time must pass before a clear cause of the pain can be found. Before that point, it may not be known if you need more testing, or if hospitalization or surgery is needed. HOME CARE INSTRUCTIONS   Do not take laxatives unless directed by your caregiver.  Take pain medicine only as directed by your caregiver.  Only take over-the-counter or prescription medicines for pain, discomfort, or fever as directed by your caregiver.  Try a clear liquid diet (broth, tea, or water) for as long as directed by your caregiver. Slowly move to a bland diet as tolerated. SEEK IMMEDIATE MEDICAL CARE IF:   The pain does not go away.  You have a fever.  You keep throwing up (vomiting).  The pain is felt only in portions of the abdomen. Pain in the right side could possibly be appendicitis. In an adult, pain in the left lower portion of the abdomen could be colitis or diverticulitis.  You pass bloody or black tarry stools. MAKE SURE YOU:   Understand these instructions.  Will watch your condition.  Will get help right away if you are not doing well or get worse. Document Released: 02/27/2005 Document Revised: 08/12/2011 Document Reviewed: 01/06/2008 [facility] Patient Information 2014 [place].  Constipation, Adult Constipation is when a person has fewer than 3 bowel movements a week; has difficulty having a bowel movement; or has stools that are dry, hard, or larger than normal. As people grow older, constipation is more common. If you try to fix constipation with medicines that make you have a bowel movement (laxatives), the problem may get worse. Long-term laxative use may cause the muscles of the colon to become weak. A low-fiber diet, not taking in enough fluids, and taking certain medicines may make constipation worse. CAUSES   Certain medicines, such as antidepressants, pain medicine, iron supplements, antacids, and water pills.   Certain diseases, such as  diabetes, irritable bowel syndrome (IBS), thyroid disease, or depression.   Not drinking enough water.   Not eating enough fiber-rich foods.   Stress or travel.  Lack of physical activity or exercise.  Not going to the restroom when there is the urge to have a bowel movement.  Ignoring the urge to have a bowel movement.  Using laxatives too much. SYMPTOMS   Having fewer than 3 bowel movements a week.   Straining to have a bowel movement.   Having hard, dry, or larger than normal stools.   Feeling full or bloated.   Pain in the lower abdomen.  Not feeling relief after having a bowel movement. DIAGNOSIS  Your caregiver will take a medical history and perform a physical exam. Further testing may be done for severe constipation. Some tests may include:   A barium enema X-ray to examine your rectum, colon, and sometimes, your small intestine.  A sigmoidoscopy to examine your lower colon.  A colonoscopy to examine your entire colon. TREATMENT  Treatment will depend on the severity of your constipation  and what is causing it. Some dietary treatments include drinking more fluids and eating more fiber-rich foods. Lifestyle treatments may include regular exercise. If these diet and lifestyle recommendations do not help, your caregiver may recommend taking over-the-counter laxative medicines to help you have bowel movements. Prescription medicines may be prescribed if over-the-counter medicines do not work.  HOME CARE INSTRUCTIONS   Increase dietary fiber in your diet, such as fruits, vegetables, whole grains, and beans. Limit high-fat and processed sugars in your diet, such as [place] fries, hamburgers, cookies, candies, and soda.   A fiber supplement may be added to your diet if you cannot get enough fiber from foods.   Drink enough fluids to keep your urine clear or pale yellow.   Exercise regularly or as directed by your caregiver.   Go to the restroom when you have  the urge to go. Do not hold it.  Only take medicines as directed by your caregiver. Do not take other medicines for constipation without talking to your caregiver first. [place] IF:   You have bright red blood in your stool.   Your constipation lasts for more than 4 days or gets worse.   You have abdominal or rectal pain.   You have thin, pencil-like stools.  You have unexplained weight loss. MAKE SURE YOU:   Understand these instructions.  Will watch your condition.  Will get help right away if you are not doing well or get worse. Document Released: 02/16/2004 Document Revised: 08/12/2011 Document Reviewed: 04/23/2011 [facility] Patient Information 2014 [place], [place].  Smoking Cessation Quitting smoking is important to your health and has many advantages. However, it is not always easy to quit since nicotine is a very addictive drug. Often times, people try 3 times or more before being able to quit. This document explains the best ways for you to prepare to quit smoking. Quitting takes hard work and a lot of effort, but you can do it. ADVANTAGES OF QUITTING SMOKING  You will live longer, feel better, and live better.  Your body will feel the impact of quitting smoking almost immediately.  Within 20 minutes, blood pressure decreases. Your pulse returns to its normal level.  After 8 hours, carbon monoxide levels in the blood return to normal. Your oxygen level increases.  After 24 hours, the chance of having a heart attack starts to decrease. Your breath, hair, and body stop smelling like smoke.  After 48 hours, damaged nerve endings begin to recover. Your sense of taste and smell improve.  After 72 hours, the body is virtually free of nicotine. Your bronchial tubes relax and breathing becomes easier.  After 2 to 12 weeks, lungs can hold more air. Exercise becomes easier and circulation improves.  The risk of having a heart attack, stroke, cancer, or  lung disease is greatly reduced.  After 1 year, the risk of coronary heart disease is cut in half.  After 5 years, the risk of stroke falls to the same as a nonsmoker.  After 10 years, the risk of lung cancer is cut in half and the risk of other cancers decreases significantly.  After 15 years, the risk of coronary heart disease drops, usually to the level of a nonsmoker.  If you are pregnant, quitting smoking will improve your chances of having a healthy baby.  The people you live with, especially any children, will be healthier.  You will have extra money to spend on things other than cigarettes. QUESTIONS TO THINK ABOUT  BEFORE ATTEMPTING TO QUIT You may want to talk about your answers with your caregiver.  Why do you want to quit?  If you tried to quit in the past, what helped and what did not?  What will be the most difficult situations for you after you quit? How will you plan to handle them?  Who can help you through the tough times? Your family? Friends? A caregiver?  What pleasures do you get from smoking? What ways can you still get pleasure if you quit? Here are some questions to ask your caregiver:  How can you help me to be successful at quitting?  What medicine do you think would be best for me and how should I take it?  What should I do if I need more help?  What is smoking withdrawal like? How can I get information on withdrawal? GET READY  Set a quit date.  Change your environment by getting rid of all cigarettes, ashtrays, matches, and lighters in your home, car, or work. Do not let people smoke in your home.  Review your past attempts to quit. Think about what worked and what did not. GET SUPPORT AND ENCOURAGEMENT You have a better chance of being successful if you have help. You can get support in many ways.  Tell your family, friends, and co-workers that you are going to quit and need their support. Ask them not to smoke around you.  Get individual,  group, or telephone counseling and support. Programs are available at [employer] and health centers. Call your local health department for information about programs in your area.  Spiritual beliefs and practices may help some smokers quit.  Download a "quit meter" on your computer to keep track of quit statistics, such as how long you have gone without smoking, cigarettes not smoked, and money saved.  Get a self-help book about quitting smoking and staying off of tobacco. [place] yourself from urges to smoke. Talk to someone, go for a walk, or occupy your time with a task.  Change your normal routine. Take a different route to work. Drink tea instead of coffee. Eat breakfast in a different place.  Reduce your stress. Take a hot bath, exercise, or read a book.  Plan something enjoyable to do every day. Reward yourself for not smoking.  Explore interactive web-based programs that specialize in helping you quit. GET MEDICINE AND USE IT CORRECTLY Medicines can help you stop smoking and decrease the urge to smoke. Combining medicine with the above behavioral methods and support can greatly increase your chances of successfully quitting smoking.  Nicotine replacement therapy helps deliver nicotine to your body without the negative effects and risks of smoking. Nicotine replacement therapy includes nicotine gum, lozenges, inhalers, nasal sprays, and skin patches. Some may be available over-the-counter and others require a prescription.  Antidepressant medicine helps people abstain from smoking, but how this works is unknown. This medicine is available by prescription.  Nicotinic receptor partial agonist medicine simulates the effect of nicotine in your brain. This medicine is available by prescription. Ask your caregiver for advice about which medicines to use and how to use them based on your health history. Your caregiver will tell you what side effects to  look out for if you choose to be on a medicine or therapy. Carefully read the information on the package. Do not use any other product containing nicotine while using a nicotine replacement product.  RELAPSE OR DIFFICULT SITUATIONS Most  relapses occur within the first 3 months after quitting. Do not be discouraged if you start smoking again. Remember, most people try several times before finally quitting. You may have symptoms of withdrawal because your body is used to nicotine. You may crave cigarettes, be irritable, feel very hungry, cough often, get headaches, or have difficulty concentrating. The withdrawal symptoms are only temporary. They are strongest when you first quit, but they will go away within 10 14 days. To reduce the chances of relapse, try to:  Avoid drinking alcohol. Drinking lowers your chances of successfully quitting.  Reduce the amount of caffeine you consume. Once you quit smoking, the amount of caffeine in your body increases and can give you symptoms, such as a rapid heartbeat, sweating, and anxiety.  Avoid smokers because they can make you want to smoke.  Do not let weight gain distract you. Many smokers will gain weight when they quit, usually less than 10 pounds. Eat a healthy diet and stay active. You can always lose the weight gained after you quit.  Find ways to improve your mood other than smoking. FOR MORE INFORMATION  www.smokefree.gov  Document Released: 05/14/2001 Document Revised: 11/19/2011 Document Reviewed: 08/29/2011 [facility] Patient Information 2014 [place], [place].

## 2013-06-07 NOTE — ED Notes (Signed)
CT notified that patient has finished her contrast.

## 2013-06-08 NOTE — ED Provider Notes (Signed)
Medical screening examination/treatment/procedure(s) were conducted as a shared visit with non-physician practitioner(s) and myself.  I personally evaluated the patient during the encounter.  EKG Interpretation    Date/Time:  Sunday June 06 2013 19:31:19 EST Ventricular Rate:  78 PR Interval:  160 QRS Duration: 78 QT Interval:  370 QTC Calculation: 421 R Axis:   4 Text Interpretation:  Normal sinus rhythm Normal ECG ED PHYSICIAN INTERPRETATION AVAILABLE IN [facility] Confirmed by TEST, RECORD ([id number]) on 06/08/2013 10:31:05 AM            Michele [name] is a 58 y.o. female hx of COPD, CAD here with abdominal pain and chest pain. Epigastric pain for 2 days, worse with eating. Today it radiated to her chest. She was admitted a month ago for chest pain and had normal enzymes and normal myoview. Comfortable, + epigastric and periumbilical tenderness. CT showed constipation and hiatal hernia. Trop neg x 2. The PA called Dr. Claiborne Billings, cardiologist, who states that given normal myoview last month and neg trop x 2, no need for further cardiac workup. She is likely having GERD vs gastritis vs symptoms for hiatal hernia. Stable for d/c. May take miralax, pepcid.    [name], MD 06/08/13 2150

## 2013-06-14 ENCOUNTER — Ambulatory Visit (INDEPENDENT_AMBULATORY_CARE_PROVIDER_SITE_OTHER): Payer: Medicare Other | Admitting: Cardiology

## 2013-06-14 VITALS — BP 100/60 | HR 72 | Ht 65.0 in | Wt 159.0 lb

## 2013-06-14 DIAGNOSIS — Q619 Cystic kidney disease, unspecified: Secondary | ICD-10-CM | POA: Diagnosis not present

## 2013-06-14 DIAGNOSIS — Z9861 Coronary angioplasty status: Secondary | ICD-10-CM

## 2013-06-14 DIAGNOSIS — I2510 Atherosclerotic heart disease of native coronary artery without angina pectoris: Secondary | ICD-10-CM

## 2013-06-14 DIAGNOSIS — F17200 Nicotine dependence, unspecified, uncomplicated: Secondary | ICD-10-CM | POA: Diagnosis not present

## 2013-06-14 DIAGNOSIS — Z20828 Contact with and (suspected) exposure to other viral communicable diseases: Secondary | ICD-10-CM | POA: Diagnosis not present

## 2013-06-14 DIAGNOSIS — K7689 Other specified diseases of liver: Secondary | ICD-10-CM | POA: Diagnosis not present

## 2013-06-14 NOTE — Patient Instructions (Signed)
No change in medications. Continue taking as directed. Follow-up as needed.

## 2013-06-15 ENCOUNTER — Encounter: Payer: Self-pay | Admitting: Cardiology

## 2013-06-15 NOTE — Assessment & Plan Note (Signed)
Continues to smoke 1/2 ppd. Discussion was held on importance to attempt smoking cessation. Pt seems slightly motivated to quit.

## 2013-06-15 NOTE — Assessment & Plan Note (Signed)
Stable. No recent CP or SOB. Continue DAPT with ASA + Effient. Also resume Imdur, Coreg, Amoldipine and pravastatin.

## 2013-06-15 NOTE — Progress Notes (Signed)
Patient ID: Michele [name], female   DOB: 07/20/1955, 58 y.o.   MRN: [id number]  06/15/2013 Michele [name]   24-May-1956  [id number]  Primary Physicia [name], MD Primary Cardiologist: Dr. [name]  HPI:  This is a 58 y.o. Female, followed by Dr. [name], with a past medical history significant for coronary disease. She had an inferior MI in May of 2013 treated with a circumflex DES. She had some residual disease in the RCA that was intervened on in a staged fashion in June 2013. She had a Myoview in Oct 2013 that was negative for ischemia but she had a hypotensive response. Her condition is complicated by severe anxiety. Since 2013, she has had multiple admissions and presentations to the ER for complaints of chest pain, leading to several re-look LHC and repeat NST. Her most recent cardiac catheterizations have shown patent stents but have also revealed coronary vasospasm. Her last NST was in November 2014, after she ruled out for an MI. It revealed no induced ischemia and her EF was 70%.   She apparently presented back to the [facility] ER on 06/06/13 with a complaint of mid epigastric pain, worse with eating. A CT scan showed a hiatal hernia. Troponin was negative in the ER x 2. Apparently, based on the ER physician's note, Dr. Claiborne Billings was notified and stated that given normal myoview in Novemer and neg trop x 2, there was no need for further cardiac workup. She was discharged from the ED with a diagnosis of GERD and was given a prescription of pepcid. She was instructed to follow-up in our office.    In clinic today, she reports that she is doing better. She states that the discomfort she had in the ER was mostly abdominal and she was unsure why she was instructed to follow-up in our office. She denies any recent chest pain or SOB. She reports daily compliance with all of her prescribed medications, including her Effient. She admits that she has continued to smoke and consumes roughly 1/2 ppd.      Current Outpatient Prescriptions  Medication Sig Dispense Refill  . amLODipine (NORVASC) 2.5 MG tablet Take 1 tablet (2.5 mg total) by mouth daily.      [name] aspirin 81 MG EC tablet Take 81 mg by mouth at bedtime. Swallow whole.      . Calcium Carbonate-Vitamin D (CALCIUM 600+D PO) Take 1 tablet by mouth 2 (two) times daily.       . carvedilol (COREG) 3.125 MG tablet Take 1 tablet (3.125 mg total) by mouth 2 (two) times daily with a meal.  60 tablet  5  . clonazePAM (KLONOPIN) 2 MG tablet Take 2 mg by mouth daily as needed for anxiety.       . Coenzyme Q10 (COQ10) 100 MG CAPS Take 100 mg by mouth daily.       . isosorbide mononitrate (IMDUR) 30 MG 24 hr tablet Take 30 mg by mouth at bedtime.      . nitroGLYCERIN (NITROSTAT) 0.4 MG SL tablet Place 0.4 mg under the tongue every 5 (five) minutes as needed for chest pain.      [name] omega-3 acid ethyl esters (LOVAZA) 1 G capsule Take 1 g by mouth 2 (two) times daily.      [name] omeprazole (PRILOSEC) 20 MG capsule Take 20 mg by mouth daily.      . polyethylene glycol powder (GLYCOLAX/MIRALAX) powder Take 17 g by mouth 2 (two) times daily. Until daily soft stools  OTC  255 g  0  . prasugrel (EFFIENT) 10 MG TABS tablet Take 10 mg by mouth daily.      . pravastatin (PRAVACHOL) 20 MG tablet Take 20 mg by mouth at bedtime.      [name] tiotropium (SPIRIVA) 18 MCG inhalation capsule Place 18 mcg into inhaler and inhale daily.       No current facility-administered medications for this visit.    Allergies  Allergen Reactions  . Ibuprofen Other (See Comments)    GI upset  . Sulfonamide Derivatives Itching and Rash  . Aspirin Other (See Comments)     GI upset (takes low dose aspirin at night) Pt can take enteric coated  . Ciprofloxacin     Low blood sugar, shakiness  . Crestor [Rosuvastatin]     Myalgias   . Lipitor [Atorvastatin]     Myalgias   . Wellbutrin [Bupropion] Palpitations    History   Social History  . Marital Status: Divorced     Spouse Name: N/A    Number of Children: 44  . Years of Education: N/A   Occupational History  . Disabled     Social History Main Topics  . Smoking status: Current Every Day Smoker -- 0.50 packs/day for 40 years    Types: Cigarettes  . Smokeless tobacco: Never Used     Comment: 04/15/12 "I quit once for 2 1/2 years; smoking cessation counselor already here to visit"; done to less than 1/2 ppd (03/02/2013) - "1 pack per week" - 05/24/13  . Alcohol Use: Yes     Comment: occasional  . Drug Use: No  . Sexual Activity: Not Currently    Birth Control/ Protection: Post-menopausal   Other Topics Concern  . Not on file   Social History Narrative   Divorced mother of 101 and a grandmother 41, great-grandmother of 1    On disability, previously worked as a Educational psychologist.  Quit smoking 06/2007 but restarted 1/11 -- smoking a pack a day.  -- now a pack lasts a week.   Does not drink alcohol.   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   0 Caffeine drinks daily      Review of Systems: General: negative for chills, fever, night sweats or weight changes.  Cardiovascular: negative for chest pain, dyspnea on exertion, edema, orthopnea, palpitations, paroxysmal nocturnal dyspnea or shortness of breath Dermatological: negative for rash Respiratory: negative for cough or wheezing Urologic: negative for hematuria Abdominal: negative for nausea, vomiting, diarrhea, bright red blood per rectum, melena, or hematemesis Neurologic: negative for visual changes, syncope, or dizziness All other systems reviewed and are otherwise negative except as noted above.    Blood pressure 100/60, pulse 72, height 5\' 5"  (1.651 m), weight 159 lb (72.122 kg).  General appearance: alert, cooperative and no distress Neck: no carotid bruit and no JVD Lungs: clear to auscultation bilaterally Heart: regular rate and rhythm, S1, S2 normal, no murmur, click, rub or gallop Extremities: no LEE Pulses: 2+  and symmetric Skin: warm and dry Neurologic: Grossly normal    ASSESSMENT AND PLAN:   CAD -S/P MI-PCI AVG 5/13 then staged DES to RCA  11/06/11. ISR- PCI 11/19/12, cath 02/18/13- no ISR, + spasm Stable. No recent CP or SOB. Continue DAPT with ASA + Effient. Also resume Imdur, Coreg, Amoldipine and pravastatin.  NICOTINE ADDICTION - Tobacco Abuse, long standing Continues to smoke 1/2 ppd. Discussion was held on importance to attempt smoking cessation. Pt seems  slightly motivated to quit.     PLAN  58 y/o female with prior stenting to circumflex and RCA in 2013, with recent LHCs showing patent stents and recent low risk NST 2 months ago, presents for hospital f/u after being seen in the ED for mid-epistatic pain. She ruled out for MI and she was given the diagnosis of GERD. Her abdominal discomfort has improved and she denies any recent symptoms of chest pain or SOB. She continues to take her medications as directed, and reports daily compliance with Effient. Unfortunately, she continues to smoke 1/2 ppd and she was counciled on the importance of smoking cessation. She is otherwise stable from a cardiovascular standpoint. She will continue to f/u with Dr. [name] every 6 months.   Cedric Mcclaine, BRITTAINYPA-C 06/15/2013 11:05 AM

## 2013-06-25 DIAGNOSIS — K219 Gastro-esophageal reflux disease without esophagitis: Secondary | ICD-10-CM | POA: Diagnosis not present

## 2013-06-25 DIAGNOSIS — R195 Other fecal abnormalities: Secondary | ICD-10-CM | POA: Diagnosis not present

## 2013-06-25 DIAGNOSIS — R5381 Other malaise: Secondary | ICD-10-CM | POA: Diagnosis not present

## 2013-06-25 DIAGNOSIS — K5900 Constipation, unspecified: Secondary | ICD-10-CM | POA: Diagnosis not present

## 2013-06-25 DIAGNOSIS — R109 Unspecified abdominal pain: Secondary | ICD-10-CM | POA: Diagnosis not present

## 2013-06-25 DIAGNOSIS — R5383 Other fatigue: Secondary | ICD-10-CM | POA: Diagnosis not present

## 2013-06-25 DIAGNOSIS — R635 Abnormal weight gain: Secondary | ICD-10-CM | POA: Diagnosis not present

## 2013-06-29 ENCOUNTER — Other Ambulatory Visit: Payer: Self-pay | Admitting: Cardiology

## 2013-06-29 NOTE — Telephone Encounter (Signed)
Rx was sent to pharmacy electronically. 

## 2013-07-01 ENCOUNTER — Other Ambulatory Visit: Payer: Self-pay | Admitting: Cardiology

## 2013-07-01 NOTE — Telephone Encounter (Signed)
Rx was sent to pharmacy electronically. 

## 2013-07-07 ENCOUNTER — Other Ambulatory Visit: Payer: Self-pay

## 2013-07-07 DIAGNOSIS — Z1231 Encounter for screening mammogram for malignant neoplasm of breast: Secondary | ICD-10-CM

## 2013-07-08 ENCOUNTER — Telehealth: Payer: Self-pay | Admitting: Cardiology

## 2013-07-08 ENCOUNTER — Telehealth: Payer: Self-pay | Admitting: Physician Assistant

## 2013-07-08 MED ORDER — ISOSORBIDE MONONITRATE ER 30 MG PO TB24: 30.0000 mg | ORAL_TABLET | Freq: Every day | ORAL | Status: DC

## 2013-07-08 NOTE — Telephone Encounter (Signed)
Returned call and pt verified x 2.  Pt stated she thinks she took her night meds this morning.  Stated her HR keeps going up and down.  RN reviewed meds w/ pt.  Pt advised to take carvedilol tonight as scheduled and restart all other meds as directed tomorrow.  Pt stated she did not want to take anything else, other than the carvediolol, tonight and advised that is fine.  Pt unable to check BP while on the phone.  Stated "it will take me a while" to get it to check it.  Pt advised to hold carvedilol if BP 100/70 or lower.  Pt verbalized understanding and agreed w/ plan.

## 2013-07-08 NOTE — Telephone Encounter (Signed)
Please call,she think she took her night medicine this morning,

## 2013-07-08 NOTE — Telephone Encounter (Signed)
Refill for Imdur was sent to pharmacy.

## 2013-07-08 NOTE — Telephone Encounter (Signed)
Patient called and reported she took her evening meds in the morning and didn't know what to take tonight.  I asked her to take the effient and coreg.  Arne Schlender 5:47 PM

## 2013-07-29 ENCOUNTER — Ambulatory Visit: Payer: Medicare Other

## 2013-08-04 ENCOUNTER — Other Ambulatory Visit: Payer: Self-pay | Admitting: Family Medicine

## 2013-08-04 ENCOUNTER — Other Ambulatory Visit (HOSPITAL_COMMUNITY)
Admission: RE | Admit: 2013-08-04 | Discharge: 2013-08-04 | Disposition: A | Discharge status: Home / Self Care | Payer: Medicare Other | Source: Ambulatory Visit | Attending: Family Medicine | Admitting: Family Medicine

## 2013-08-04 DIAGNOSIS — L678 Other hair color and hair shaft abnormalities: Secondary | ICD-10-CM | POA: Diagnosis not present

## 2013-08-04 DIAGNOSIS — Z124 Encounter for screening for malignant neoplasm of cervix: Secondary | ICD-10-CM | POA: Diagnosis not present

## 2013-08-04 DIAGNOSIS — R109 Unspecified abdominal pain: Secondary | ICD-10-CM | POA: Diagnosis not present

## 2013-08-04 DIAGNOSIS — R7309 Other abnormal glucose: Secondary | ICD-10-CM | POA: Diagnosis not present

## 2013-08-04 DIAGNOSIS — F411 Generalized anxiety disorder: Secondary | ICD-10-CM | POA: Diagnosis not present

## 2013-08-04 DIAGNOSIS — R350 Frequency of micturition: Secondary | ICD-10-CM | POA: Diagnosis not present

## 2013-08-04 DIAGNOSIS — Z0000 Encounter for general adult medical examination without abnormal findings: Secondary | ICD-10-CM | POA: Diagnosis not present

## 2013-08-04 DIAGNOSIS — I2510 Atherosclerotic heart disease of native coronary artery without angina pectoris: Secondary | ICD-10-CM | POA: Diagnosis not present

## 2013-08-04 DIAGNOSIS — L738 Other specified follicular disorders: Secondary | ICD-10-CM | POA: Diagnosis not present

## 2013-08-10 ENCOUNTER — Emergency Department (HOSPITAL_COMMUNITY): Payer: Medicare Other

## 2013-08-10 ENCOUNTER — Encounter (HOSPITAL_COMMUNITY): Payer: Self-pay | Admitting: Emergency Medicine

## 2013-08-10 ENCOUNTER — Telehealth: Payer: Self-pay | Admitting: Cardiology

## 2013-08-10 ENCOUNTER — Observation Stay (HOSPITAL_COMMUNITY)
Admission: EM | Admit: 2013-08-10 | Discharge: 2013-08-11 | Disposition: A | Discharge status: Home / Self Care | Payer: Medicare Other | Attending: Internal Medicine | Admitting: Internal Medicine

## 2013-08-10 DIAGNOSIS — Z872 Personal history of diseases of the skin and subcutaneous tissue: Secondary | ICD-10-CM | POA: Diagnosis not present

## 2013-08-10 DIAGNOSIS — R079 Chest pain, unspecified: Secondary | ICD-10-CM

## 2013-08-10 DIAGNOSIS — I2510 Atherosclerotic heart disease of native coronary artery without angina pectoris: Secondary | ICD-10-CM

## 2013-08-10 DIAGNOSIS — R1013 Epigastric pain: Secondary | ICD-10-CM | POA: Diagnosis not present

## 2013-08-10 DIAGNOSIS — F3289 Other specified depressive episodes: Secondary | ICD-10-CM | POA: Insufficient documentation

## 2013-08-10 DIAGNOSIS — R51 Headache: Secondary | ICD-10-CM | POA: Diagnosis not present

## 2013-08-10 DIAGNOSIS — R1031 Right lower quadrant pain: Secondary | ICD-10-CM | POA: Insufficient documentation

## 2013-08-10 DIAGNOSIS — Z9861 Coronary angioplasty status: Secondary | ICD-10-CM | POA: Diagnosis not present

## 2013-08-10 DIAGNOSIS — I252 Old myocardial infarction: Secondary | ICD-10-CM | POA: Insufficient documentation

## 2013-08-10 DIAGNOSIS — R0789 Other chest pain: Secondary | ICD-10-CM | POA: Diagnosis not present

## 2013-08-10 DIAGNOSIS — K5900 Constipation, unspecified: Secondary | ICD-10-CM | POA: Diagnosis not present

## 2013-08-10 DIAGNOSIS — K219 Gastro-esophageal reflux disease without esophagitis: Secondary | ICD-10-CM | POA: Insufficient documentation

## 2013-08-10 DIAGNOSIS — M4712 Other spondylosis with myelopathy, cervical region: Secondary | ICD-10-CM | POA: Diagnosis not present

## 2013-08-10 DIAGNOSIS — F17200 Nicotine dependence, unspecified, uncomplicated: Secondary | ICD-10-CM | POA: Diagnosis not present

## 2013-08-10 DIAGNOSIS — Z720 Tobacco use: Secondary | ICD-10-CM | POA: Diagnosis present

## 2013-08-10 DIAGNOSIS — E785 Hyperlipidemia, unspecified: Secondary | ICD-10-CM | POA: Insufficient documentation

## 2013-08-10 DIAGNOSIS — G4700 Insomnia, unspecified: Secondary | ICD-10-CM | POA: Diagnosis not present

## 2013-08-10 DIAGNOSIS — I10 Essential (primary) hypertension: Secondary | ICD-10-CM | POA: Diagnosis not present

## 2013-08-10 DIAGNOSIS — F411 Generalized anxiety disorder: Secondary | ICD-10-CM

## 2013-08-10 DIAGNOSIS — E559 Vitamin D deficiency, unspecified: Secondary | ICD-10-CM | POA: Diagnosis not present

## 2013-08-10 DIAGNOSIS — G8929 Other chronic pain: Secondary | ICD-10-CM | POA: Diagnosis not present

## 2013-08-10 DIAGNOSIS — M25519 Pain in unspecified shoulder: Secondary | ICD-10-CM

## 2013-08-10 DIAGNOSIS — K7689 Other specified diseases of liver: Secondary | ICD-10-CM | POA: Diagnosis not present

## 2013-08-10 DIAGNOSIS — M542 Cervicalgia: Secondary | ICD-10-CM | POA: Diagnosis not present

## 2013-08-10 DIAGNOSIS — R42 Dizziness and giddiness: Secondary | ICD-10-CM | POA: Insufficient documentation

## 2013-08-10 DIAGNOSIS — I25119 Atherosclerotic heart disease of native coronary artery with unspecified angina pectoris: Secondary | ICD-10-CM

## 2013-08-10 DIAGNOSIS — F329 Major depressive disorder, single episode, unspecified: Secondary | ICD-10-CM | POA: Diagnosis not present

## 2013-08-10 DIAGNOSIS — R109 Unspecified abdominal pain: Secondary | ICD-10-CM

## 2013-08-10 DIAGNOSIS — J449 Chronic obstructive pulmonary disease, unspecified: Secondary | ICD-10-CM | POA: Insufficient documentation

## 2013-08-10 DIAGNOSIS — R209 Unspecified disturbances of skin sensation: Secondary | ICD-10-CM | POA: Insufficient documentation

## 2013-08-10 DIAGNOSIS — J438 Other emphysema: Secondary | ICD-10-CM | POA: Diagnosis not present

## 2013-08-10 DIAGNOSIS — J4489 Other specified chronic obstructive pulmonary disease: Secondary | ICD-10-CM | POA: Insufficient documentation

## 2013-08-10 DIAGNOSIS — M25512 Pain in left shoulder: Secondary | ICD-10-CM

## 2013-08-10 HISTORY — DX: Essential (primary) hypertension: I10

## 2013-08-10 LAB — CBC WITH DIFFERENTIAL/PLATELET
Basophils Absolute: 0 10*3/uL (ref 0.0–0.1)
Basophils Relative: 0 % (ref 0–1)
Eosinophils Absolute: 0.1 10*3/uL (ref 0.0–0.7)
Eosinophils Relative: 1 % (ref 0–5)
HCT: 42 % (ref 36.0–46.0)
Hemoglobin: 14.2 g/dL (ref 12.0–15.0)
Lymphocytes Relative: 18 % (ref 12–46)
Lymphs Abs: 1.2 10*3/uL (ref 0.7–4.0)
MCH: 30.1 pg (ref 26.0–34.0)
MCHC: 33.8 g/dL (ref 30.0–36.0)
MCV: 89.2 fL (ref 78.0–100.0)
Monocytes Absolute: 0.3 10*3/uL (ref 0.1–1.0)
Monocytes Relative: 5 % (ref 3–12)
Neutro Abs: 5.3 10*3/uL (ref 1.7–7.7)
Neutrophils Relative %: 76 % (ref 43–77)
Platelets: 186 10*3/uL (ref 150–400)
RBC: 4.71 MIL/uL (ref 3.87–5.11)
RDW: 13.7 % (ref 11.5–15.5)
WBC: 7 10*3/uL (ref 4.0–10.5)

## 2013-08-10 LAB — LIPASE, BLOOD
Lipase: 34 U/L (ref 11–59)
Lipase: 37 U/L (ref 11–59)

## 2013-08-10 LAB — URINALYSIS, ROUTINE W REFLEX MICROSCOPIC
Bilirubin Urine: NEGATIVE
Glucose, UA: NEGATIVE mg/dL
Hgb urine dipstick: NEGATIVE
Ketones, ur: NEGATIVE mg/dL
Leukocytes, UA: NEGATIVE
Nitrite: NEGATIVE
Protein, ur: NEGATIVE mg/dL
Specific Gravity, Urine: 1.013 (ref 1.005–1.030)
Urobilinogen, UA: 0.2 mg/dL (ref 0.0–1.0)
pH: 7 (ref 5.0–8.0)

## 2013-08-10 LAB — BASIC METABOLIC PANEL
BUN: 18 mg/dL (ref 6–23)
CO2: 26 mEq/L (ref 19–32)
Calcium: 9.7 mg/dL (ref 8.4–10.5)
Chloride: 105 mEq/L (ref 96–112)
Creatinine, Ser: 0.81 mg/dL (ref 0.50–1.10)
GFR calc Af Amer: >90 mL/min (ref >90)
GFR calc non Af Amer: 78 mL/min — ABNORMAL LOW (ref >90)
Glucose, Bld: 91 mg/dL (ref 70–99)
Potassium: 5.5 mEq/L — ABNORMAL HIGH (ref 3.7–5.3)
Sodium: 142 mEq/L (ref 137–147)

## 2013-08-10 LAB — HEPATIC FUNCTION PANEL
ALT: 15 U/L (ref 0–35)
AST: 19 U/L (ref 0–37)
Albumin: 3.7 g/dL (ref 3.5–5.2)
Alkaline Phosphatase: 62 U/L (ref 39–117)
Bilirubin, Direct: <0.2 mg/dL (ref 0.0–0.3)
Total Bilirubin: <0.2 mg/dL — ABNORMAL LOW (ref 0.3–1.2)
Total Protein: 7.1 g/dL (ref 6.0–8.3)

## 2013-08-10 LAB — I-STAT TROPONIN, ED: Troponin i, poc: 0 ng/mL (ref 0.00–0.08)

## 2013-08-10 LAB — AMYLASE: Amylase: 29 U/L (ref 0–105)

## 2013-08-10 LAB — TROPONIN I: Troponin I: <0.3 ng/mL (ref <0.30)

## 2013-08-10 LAB — MAGNESIUM: Magnesium: 2 mg/dL (ref 1.5–2.5)

## 2013-08-10 MED ORDER — POLYETHYLENE GLYCOL 3350 17 G PO PACK
17.0000 g | PACK | Freq: Every day | ORAL | Status: DC | PRN
  Filled 2013-08-10: qty 1

## 2013-08-10 MED ORDER — OMEGA-3-ACID ETHYL ESTERS 1 G PO CAPS
1.0000 g | ORAL_CAPSULE | Freq: Two times a day (BID) | ORAL | Status: DC
  Administered 2013-08-10 – 2013-08-11 (×2): 1 g via ORAL
  Filled 2013-08-10 (×3): qty 1

## 2013-08-10 MED ORDER — ASPIRIN EC 81 MG PO TBEC
81.0000 mg | DELAYED_RELEASE_TABLET | Freq: Every day | ORAL | Status: DC
  Administered 2013-08-10: 81 mg via ORAL
  Filled 2013-08-10 (×2): qty 1

## 2013-08-10 MED ORDER — DOCUSATE SODIUM 100 MG PO CAPS
100.0000 mg | ORAL_CAPSULE | Freq: Two times a day (BID) | ORAL | Status: DC
  Administered 2013-08-10: 100 mg via ORAL
  Filled 2013-08-10 (×3): qty 1

## 2013-08-10 MED ORDER — PRASUGREL HCL 10 MG PO TABS
10.0000 mg | ORAL_TABLET | Freq: Every day | ORAL | Status: DC
  Administered 2013-08-11: 10 mg via ORAL
  Filled 2013-08-10 (×2): qty 1

## 2013-08-10 MED ORDER — HYDROMORPHONE HCL PF 1 MG/ML IJ SOLN
1.0000 mg | Freq: Once | INTRAMUSCULAR | Status: AC
  Administered 2013-08-10: 1 mg via INTRAVENOUS
  Filled 2013-08-10: qty 1

## 2013-08-10 MED ORDER — SODIUM CHLORIDE 0.9 % IJ SOLN: 3.0000 mL | Freq: Two times a day (BID) | INTRAMUSCULAR | Status: DC

## 2013-08-10 MED ORDER — NITROGLYCERIN 0.4 MG SL SUBL: 0.4000 mg | SUBLINGUAL_TABLET | SUBLINGUAL | Status: DC | PRN

## 2013-08-10 MED ORDER — ONDANSETRON HCL 4 MG/2ML IJ SOLN: 4.0000 mg | Freq: Four times a day (QID) | INTRAMUSCULAR | Status: DC | PRN

## 2013-08-10 MED ORDER — PANTOPRAZOLE SODIUM 40 MG PO TBEC
80.0000 mg | DELAYED_RELEASE_TABLET | Freq: Every day | ORAL | Status: DC
  Administered 2013-08-11: 80 mg via ORAL
  Filled 2013-08-10: qty 2

## 2013-08-10 MED ORDER — SODIUM CHLORIDE 0.9 % IJ SOLN: 3.0000 mL | INTRAMUSCULAR | Status: DC | PRN

## 2013-08-10 MED ORDER — SODIUM CHLORIDE 0.9 % IV SOLN: 250.0000 mL | INTRAVENOUS | Status: DC | PRN

## 2013-08-10 MED ORDER — ONDANSETRON HCL 4 MG/2ML IJ SOLN
4.0000 mg | Freq: Once | INTRAMUSCULAR | Status: AC
  Administered 2013-08-10: 4 mg via INTRAVENOUS
  Filled 2013-08-10: qty 2

## 2013-08-10 MED ORDER — CARVEDILOL 3.125 MG PO TABS
3.1250 mg | ORAL_TABLET | Freq: Two times a day (BID) | ORAL | Status: DC
  Filled 2013-08-10 (×3): qty 1

## 2013-08-10 MED ORDER — HYDROCODONE-ACETAMINOPHEN 5-325 MG PO TABS
1.0000 | ORAL_TABLET | ORAL | Status: DC | PRN
  Administered 2013-08-10: 1 via ORAL
  Administered 2013-08-11: 2 via ORAL
  Filled 2013-08-10: qty 1
  Filled 2013-08-10: qty 2

## 2013-08-10 MED ORDER — IOHEXOL 300 MG/ML  SOLN
25.0000 mL | Freq: Once | INTRAMUSCULAR | Status: AC | PRN
Start: 2013-08-10 — End: 2013-08-10
  Administered 2013-08-10: 25 mL via ORAL

## 2013-08-10 MED ORDER — AMLODIPINE BESYLATE 2.5 MG PO TABS
2.5000 mg | ORAL_TABLET | Freq: Every evening | ORAL | Status: DC
  Administered 2013-08-10: 2.5 mg via ORAL
  Filled 2013-08-10 (×2): qty 1

## 2013-08-10 MED ORDER — CLONAZEPAM 1 MG PO TABS: 2.0000 mg | ORAL_TABLET | Freq: Three times a day (TID) | ORAL | Status: DC | PRN

## 2013-08-10 MED ORDER — ISOSORBIDE MONONITRATE ER 30 MG PO TB24
30.0000 mg | ORAL_TABLET | Freq: Every day | ORAL | Status: DC
  Administered 2013-08-10: 30 mg via ORAL
  Filled 2013-08-10 (×2): qty 1

## 2013-08-10 MED ORDER — ALUM & MAG HYDROXIDE-SIMETH 200-200-20 MG/5ML PO SUSP: 30.0000 mL | Freq: Four times a day (QID) | ORAL | Status: DC | PRN

## 2013-08-10 MED ORDER — ASPIRIN EC 81 MG PO TBEC: 81.0000 mg | DELAYED_RELEASE_TABLET | Freq: Every day | ORAL | Status: DC

## 2013-08-10 MED ORDER — SULFAMETHOXAZOLE-TMP DS 800-160 MG PO TABS
1.0000 | ORAL_TABLET | Freq: Two times a day (BID) | ORAL | Status: DC
  Administered 2013-08-10 – 2013-08-11 (×2): 1 via ORAL
  Filled 2013-08-10 (×3): qty 1

## 2013-08-10 MED ORDER — SODIUM CHLORIDE 0.9 % IJ SOLN
3.0000 mL | Freq: Two times a day (BID) | INTRAMUSCULAR | Status: DC
  Administered 2013-08-10 – 2013-08-11 (×2): 3 mL via INTRAVENOUS

## 2013-08-10 MED ORDER — TIOTROPIUM BROMIDE MONOHYDRATE 18 MCG IN CAPS
18.0000 ug | ORAL_CAPSULE | Freq: Every day | RESPIRATORY_TRACT | Status: DC
  Filled 2013-08-10: qty 5

## 2013-08-10 MED ORDER — IOHEXOL 300 MG/ML  SOLN
100.0000 mL | Freq: Once | INTRAMUSCULAR | Status: AC | PRN
  Administered 2013-08-10: 100 mL via INTRAVENOUS

## 2013-08-10 MED ORDER — HEPARIN SODIUM (PORCINE) 5000 UNIT/ML IJ SOLN
5000.0000 [IU] | Freq: Three times a day (TID) | INTRAMUSCULAR | Status: DC
  Administered 2013-08-10 – 2013-08-11 (×2): 5000 [IU] via SUBCUTANEOUS
  Filled 2013-08-10 (×5): qty 1

## 2013-08-10 MED ORDER — ONDANSETRON HCL 4 MG PO TABS: 4.0000 mg | ORAL_TABLET | Freq: Four times a day (QID) | ORAL | Status: DC | PRN

## 2013-08-10 MED ORDER — MORPHINE SULFATE 4 MG/ML IJ SOLN
4.0000 mg | Freq: Once | INTRAMUSCULAR | Status: AC
  Administered 2013-08-10: 4 mg via INTRAVENOUS
  Filled 2013-08-10: qty 1

## 2013-08-10 NOTE — ED Provider Notes (Signed)
Patient care assumed from [facility], PA-C at shift change with cardiology evaluation pending.  Cardiology has seen and evaluated patient. Plan to admit for overnight observation for further monitoring and evaluation of chest pain.   Filed Vitals:   08/10/13 1315 08/10/13 1330 08/10/13 1345 08/10/13 1415  BP: 112/66 119/59 110/87 107/59  Pulse: 58 60 63 59  Temp:      TempSrc:      Resp:      Height:      Weight:      SpO2: 97% 97% 95% 95%     Antonietta Breach, PA-C 08/10/13 1655

## 2013-08-10 NOTE — ED Provider Notes (Signed)
Medical screening examination/treatment/procedure(s) were performed by non-physician practitioner and as supervising physician I was immediately available for consultation/collaboration.  Richarda Blade, MD 08/10/13 [phone]
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
## 2013-08-10 NOTE — ED Provider Notes (Signed)
Medical screening examination/treatment/procedure(s) were performed by non-physician practitioner and as supervising physician I was immediately available for consultation/collaboration.   EKG Interpretation   Date/Time:  Tuesday August 10 2013 11:30:35 EDT Ventricular Rate:  74 PR Interval:  158 QRS Duration: 78 QT Interval:  380 QTC Calculation: 421 R Axis:   21 Text Interpretation:  Normal sinus rhythm Normal ECG since last tracing no  significant change Confirmed by Eulis Foster  MD, [name] [phone]) on 08/10/2013  12:22:46 PM        Mercie Eon. Karle Starch, MD 08/10/13 [phone]
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
## 2013-08-10 NOTE — Telephone Encounter (Signed)
Not sure if dhe took her effient this morning or not .[name] Would like to know if she take another one . Please call    Thanks

## 2013-08-10 NOTE — Telephone Encounter (Signed)
Returned call and pt verified x 2.  Pt c/o intermittent CP and L neck, shoulder and arm pain.  Also c/o possibly missing her dose of Effient today.  Stated she called the pharmacy and they told her it was last filled on 1.26.15.  Pt unsure if she has been out of Effient since the end of February.  Pt wants to know if she should take Effient now and requests an appt to be seen.  Kerin Ransom, PA-C notified and advised pt go to ER for evaluation.  Pt informed and verbalized understanding.  Pt asked if she should take Effient now and advised to discuss w/ provider at ER.  Pt verbalized understanding and agreed w/ plan.  Pt sent to [facility] ER.  Trish notified.

## 2013-08-10 NOTE — ED Notes (Signed)
Patient states started having chest pain x 4 - 5 days ago.   Patient states it radiates to L shoulder/arm.   Patient states does have shortness of breath at times with nausea.  Denies other symptoms.

## 2013-08-11 ENCOUNTER — Other Ambulatory Visit: Payer: Medicare Other

## 2013-08-11 ENCOUNTER — Ambulatory Visit: Payer: Medicare Other | Admitting: Cardiology

## 2013-08-11 ENCOUNTER — Observation Stay (HOSPITAL_COMMUNITY): Payer: Medicare Other

## 2013-08-11 DIAGNOSIS — M19019 Primary osteoarthritis, unspecified shoulder: Secondary | ICD-10-CM | POA: Diagnosis not present

## 2013-08-11 DIAGNOSIS — R0789 Other chest pain: Secondary | ICD-10-CM

## 2013-08-11 DIAGNOSIS — R079 Chest pain, unspecified: Secondary | ICD-10-CM | POA: Diagnosis not present

## 2013-08-11 DIAGNOSIS — F411 Generalized anxiety disorder: Secondary | ICD-10-CM

## 2013-08-11 LAB — TSH: TSH: 3.141 u[IU]/mL (ref 0.350–4.500)

## 2013-08-11 LAB — BASIC METABOLIC PANEL
BUN: 20 mg/dL (ref 6–23)
CO2: 26 mEq/L (ref 19–32)
Calcium: 9 mg/dL (ref 8.4–10.5)
Chloride: 101 mEq/L (ref 96–112)
Creatinine, Ser: 0.98 mg/dL (ref 0.50–1.10)
GFR calc Af Amer: 72 mL/min — ABNORMAL LOW (ref >90)
GFR calc non Af Amer: 62 mL/min — ABNORMAL LOW (ref >90)
Glucose, Bld: 124 mg/dL — ABNORMAL HIGH (ref 70–99)
Potassium: 4.3 mEq/L (ref 3.7–5.3)
Sodium: 140 mEq/L (ref 137–147)

## 2013-08-11 LAB — CBC
HCT: 38 % (ref 36.0–46.0)
Hemoglobin: 12.8 g/dL (ref 12.0–15.0)
MCH: 30.3 pg (ref 26.0–34.0)
MCHC: 33.7 g/dL (ref 30.0–36.0)
MCV: 89.8 fL (ref 78.0–100.0)
Platelets: 164 10*3/uL (ref 150–400)
RBC: 4.23 MIL/uL (ref 3.87–5.11)
RDW: 13.7 % (ref 11.5–15.5)
WBC: 6.7 10*3/uL (ref 4.0–10.5)

## 2013-08-11 LAB — HEMOGLOBIN A1C
Hgb A1c MFr Bld: 5.8 % — ABNORMAL HIGH (ref <5.7)
Mean Plasma Glucose: 120 mg/dL — ABNORMAL HIGH (ref <117)

## 2013-08-11 LAB — HEPATIC FUNCTION PANEL
ALT: 13 U/L (ref 0–35)
AST: 19 U/L (ref 0–37)
Albumin: 3.4 g/dL — ABNORMAL LOW (ref 3.5–5.2)
Alkaline Phosphatase: 57 U/L (ref 39–117)
Bilirubin, Direct: <0.2 mg/dL (ref 0.0–0.3)
Total Bilirubin: <0.2 mg/dL — ABNORMAL LOW (ref 0.3–1.2)
Total Protein: 6.7 g/dL (ref 6.0–8.3)

## 2013-08-11 LAB — TROPONIN I
Troponin I: <0.3 ng/mL (ref <0.30)
Troponin I: <0.3 ng/mL (ref <0.30)

## 2013-08-11 MED ORDER — SODIUM CHLORIDE 0.9 % IV SOLN
Freq: Once | INTRAVENOUS | Status: AC
  Administered 2013-08-11: 15:00:00 via INTRAVENOUS

## 2013-08-11 NOTE — Discharge Summary (Signed)
Physician Discharge Summary  Patient ID: Michele [name] MRN: [id number] DOB/AGE: Dec 21, 1955 58 y.o.  Admit date: 08/10/2013 Discharge date: 08/11/2013  Admission Diagnoses: Shoulder pain, left, with associated weakness  Discharge Diagnoses:  Principal Problem:   Shoulder pain, left, with associated weakness Active Problems:   GERD   CAD -S/P MI-PCI AVG 5/13 then staged DES to RCA  11/06/11. ISR- PCI 11/19/12, cath 02/18/13- no ISR, + spasm   Tobacco abuse, ongoing   Shoulder pain   Discharged Condition: stable  Hospital Course: The patient is a 58 y.o. female, followed by Dr. [name], with a past medical history significant for coronary disease. She had an inferior MI in May of 2013 treated with a circumflex DES. She had some residual disease in the RCA that was intervened on in a staged fashion in June 2013. She had a Myoview in Oct 2013 that was negative for ischemia but she had a hypotensive response. Her condition is complicated by severe anxiety. Since 2013, she has had multiple admissions and presentations to the ER for complaints of chest pain, leading to several re-look LHC and repeat NST. Her most recent cardiac catheterizations have shown patent stents but have also revealed coronary vasospasm. Her last NST was in November 2014, after she ruled out for an MI. It revealed no induced ischemia and her EF was 70%.   She presented back to the [facility] ER on 06/06/13 with a complaint of mid epigastric pain, worse with eating. A CT scan showed a hiatal hernia. Troponin was negative in the ER x 2. Felt to be GERD. She has not seen GI yet though she continues with GI discomfort.   She reported once again to the ER yesterday on 08/10/13. She realized she had not had her Effient and was having some Lt lateral chest discomfort but mostly Lt neck and Lt shoulder pain. No recent injuries to shoulder. The pain does not increase on elliptical trainer. Some nausea associated with the pain. No SOB no vomiting,  no diaphoresis. Different from MI pain, that was chest pain and radiation to both arms. Initial Troponin was negative and her EKG demonstrated no ischemic changes. After physical examination, it was felt that her symptoms were possibly the result of a left rotator cuff injury. It was decided to admit for observation to r/o MI and to get a MRI of the shoulder.  Cardiac enzymes returned negative x 3. The MRI demonstrated focal slight degenerative changes of the posterior inferior aspect of the glenoid w/o any evidence of a rotator cuff tear. She was last seen and examined by Dr. Cathie Olden who determined she was stable for discharge home. She will be seen in follow up by Lyda Jester, PA-C. If she continues to have problems, we will consult with Dr. [name] on the need for repeat outpatient Myoview.    Consults: None  Significant Diagnostic Studies:  IMPRESSION 1. Prominent ganglion cyst in the suprascapular notch originating from a small focal tear in the posterior superior aspect of the glenoid labrum. This is could impinge upon the suprascapular nerve but there is no evidence of denervation edema in the muscles around the shoulder. 2. Focal slight degenerative changes of the posterior inferior aspect of the glenoid.  Treatments: See  Hospital Course  Discharge Exam: Blood pressure 101/53, pulse 64, temperature 98.2 F (36.8 C), temperature source Oral, resp. rate 18, height 5' 5.5" (1.664 m), weight 161 lb 8 oz (73.256 kg), SpO2 92.00%.   Disposition: 01-Home or Self Care  Discharge Orders   Future Orders Complete By Expires   Diet - low sodium heart healthy  As directed    Increase activity slowly  As directed        Medication List         amLODipine 2.5 MG tablet  Commonly known as:  NORVASC  Take 2.5 mg by mouth every evening.     aspirin 81 MG EC tablet  Take 81 mg by mouth at bedtime.     CALCIUM 600+D PO  Take 1 tablet by mouth 2 (two) times daily.      carvedilol 3.125 MG tablet  Commonly known as:  COREG  Take 3.125 mg by mouth 2 (two) times daily with a meal.     clonazePAM 2 MG tablet  Commonly known as:  KLONOPIN  Take 2 mg by mouth 3 (three) times daily as needed for anxiety.     CoQ10 100 MG Caps  Take 100 mg by mouth daily.     isosorbide mononitrate 30 MG 24 hr tablet  Commonly known as:  IMDUR  Take 30 mg by mouth at bedtime.     nitroGLYCERIN 0.4 MG SL tablet  Commonly known as:  NITROSTAT  Place 0.4 mg under the tongue every 5 (five) minutes as needed for chest pain.     omega-3 acid ethyl esters 1 G capsule  Commonly known as:  LOVAZA  Take 1 g by mouth 2 (two) times daily.     omeprazole 20 MG capsule  Commonly known as:  PRILOSEC  Take 20 mg by mouth daily.     polyethylene glycol packet  Commonly known as:  MIRALAX / GLYCOLAX  Take 17 g by mouth daily as needed for moderate constipation.     prasugrel 10 MG Tabs tablet  Commonly known as:  EFFIENT  Take 10 mg by mouth daily.     pravastatin 20 MG tablet  Commonly known as:  PRAVACHOL  Take 20 mg by mouth at bedtime.     sulfamethoxazole-trimethoprim 800-160 MG per tablet  Commonly known as:  BACTRIM DS  Take 1 tablet by mouth 2 (two) times daily.     tiotropium 18 MCG inhalation capsule  Commonly known as:  SPIRIVA  Place 18 mcg into inhaler and inhale daily.       TIME SPENT ON DISCHARGE, INCLUDING PHYSICIAN TIME: > 30 MINUTES  Signed: Lyda Jester 08/11/2013, 2:08 PM  Attending Note:   The patient was seen and examined.  Agree with assessment and plan as noted above.  Changes made to the above note as needed.  Pt has ruled out.  Her pain is very atypical.   See my note from earlier today  Ramond Dial., MD, [facility] 08/11/2013, 5:25 PM

## 2013-08-11 NOTE — Progress Notes (Signed)
Patient's blood pressure 80/40's this am with complaints of weakness, lightheadiness, and dizziness.  Dr. Acie Fredrickson of floor and made aware. Orders received to give 500 cc NS bolus.  Will continue to monitor.  Michele [name]

## 2013-08-11 NOTE — Discharge Instructions (Signed)
Chest Wall Pain Chest wall pain is pain felt in or around the chest bones and muscles. It may take up to 6 weeks to get better. It may take longer if you are active. Chest wall pain can happen on its own. Other times, things like germs, injury, coughing, or exercise can cause the pain. HOME CARE   Avoid activities that make you tired or cause pain. Try not to use your chest, belly (abdominal), or side muscles. Do not use heavy weights.  Put ice on the sore area.  Put ice in a plastic bag.  Place a towel between your skin and the bag.  Leave the ice on for 15-20 minutes for the first 2 days.  Only take medicine as told by your doctor. GET HELP RIGHT AWAY IF:   You have more pain or are very uncomfortable.  You have a fever.  Your chest pain gets worse.  You have new problems.  You feel sick to your stomach (nauseous) or throw up (vomit).  You start to sweat or feel lightheaded.  You have a cough with mucus (phlegm).  You cough up blood. MAKE SURE YOU:   Understand these instructions.  Will watch your condition.  Will get help right away if you are not doing well or get worse. Document Released: 11/06/2007 Document Revised: 08/12/2011 Document Reviewed: 01/14/2011 [facility] Patient Information 2014 [place], [place].

## 2013-08-11 NOTE — Progress Notes (Signed)
RN flagged me down to report the patients  BP was in the 80's.  The patient was given  Fluid bolus and had improvement.  She is only on 30 of imdur and 3.125 of coreg.  Will recheck BP in 30 minutes and if over 100 sys, continue with DC plans.  Alexande Sheerin PA-C

## 2013-08-11 NOTE — Progress Notes (Signed)
Patient SBP still 100 after 500 cc bolus.  Went in to assess patient and she is still c/o lightheadiness and dizziness and appears to be very groggy.  Tarri Fuller PA here on floor and made aware, in to see patient. All BP medications held today, based on BP.  Instructed to recheck patient's BP in 30 minutes and ambulate in hallways.  BP bettter in 110-120/50-60's.  Patient requesting to stay overnight, unable to find ride home at this time.  Will continue to monitor.  Sanda Linger

## 2013-08-11 NOTE — Progress Notes (Signed)
PROGRESS NOTE  Subjective:   58 y.o. Female, followed by Dr. [name], with a past medical history significant for coronary disease. She had an inferior MI in May of 2013 treated with a circumflex DES. She had some residual disease in the RCA that was intervened on in a staged fashion in June 2013. She had a Myoview in Oct 2013 that was negative for ischemia but she had a hypotensive response. Her condition is complicated by severe anxiety. Since 2013, she has had multiple admissions and presentations to the ER for complaints of chest pain, leading to several re-look LHC and repeat NST. Her most recent cardiac catheterizations have shown patent stents but have also revealed coronary vasospasm. Her last NST was in November 2014, after she ruled out for an MI. It revealed no induced ischemia and her EF was 70%.   She has ruled out.  MRI of shoulder IMPRESSION  1. Prominent ganglion cyst in the suprascapular notch originating  from a small focal tear in the posterior superior aspect of the  glenoid labrum. This is could impinge upon the suprascapular nerve  but there is no evidence of denervation edema in the muscles around  the shoulder.  2. Focal slight degenerative changes of the posterior inferior  aspect of the glenoid.   Objective:    Vital Signs:   Temp:  [96.8 F (36 C)-97.3 F (36.3 C)] 96.8 F (36 C) (03/11 0630) Pulse Rate:  [59-65] 60 (03/11 0630) Resp:  [18] 18 (03/11 1211) BP: (88-119)/(45-87) 89/45 mmHg (03/11 1211) SpO2:  [91 %-97 %] 91 % (03/11 1211) Weight:  [161 lb 8 oz (73.256 kg)] 161 lb 8 oz (73.256 kg) (03/10 1943)  Last BM Date: 08/10/13   24-hour weight change: Weight change:   Weight trends: Filed Weights   08/10/13 1138 08/10/13 1943  Weight: 160 lb (72.576 kg) 161 lb 8 oz (73.256 kg)    Intake/Output:  03/10 0701 - 03/11 0700 In: 250 [P.O.:250] Out: -  Total I/O In: 3 [I.V.:3] Out: -    Physical Exam: BP 89/45  Pulse 60  Temp(Src)  96.8 F (36 C) (Oral)  Resp 18  Ht 5' 5.5" (1.664 m)  Wt 161 lb 8 oz (73.256 kg)  BMI 26.46 kg/m2  SpO2 91%  Wt Readings from Last 3 Encounters:  08/10/13 161 lb 8 oz (73.256 kg)  06/14/13 159 lb (72.122 kg)  05/24/13 158 lb 14.4 oz (72.077 kg)    General: Vital signs reviewed and noted.   Head: Normocephalic, atraumatic.  Eyes: conjunctivae/corneas clear.  EOM's intact.   Throat: normal  Neck:  normal   Lungs:    clear   Heart:   RR, S1S2  Abdomen:  Soft, non-tender, non-distended    Extremities: Normal    Neurologic: A&O X3, CN II - XII are grossly intact.   Psych: Normal     Labs: BMET:  Recent Labs  08/10/13 1227 08/10/13 2130 08/11/13 0159  NA 142  --  140  K 5.5*  --  4.3  CL 105  --  101  CO2 26  --  26  GLUCOSE 91  --  124*  BUN 18  --  20  CREATININE 0.81  --  0.98  CALCIUM 9.7  --  9.0  MG  --  2.0  --     Liver function tests:  Recent Labs  08/10/13 1227 08/10/13 2130  AST 19 19  ALT 15 13  ALKPHOS 62  48  BILITOT <0.2* <0.2*  PROT 7.1 6.7  ALBUMIN 3.7 3.4*    Recent Labs  08/10/13 1227 08/10/13 2130  LIPASE 34 37  AMYLASE  --  29    CBC:  Recent Labs  08/10/13 1227 08/11/13 0159  WBC 7.0 6.7  NEUTROABS 5.3  --   HGB 14.2 12.8  HCT 42.0 38.0  MCV 89.2 89.8  PLT 186 164    Cardiac Enzymes:  Recent Labs  08/10/13 2130 08/11/13 0159 08/11/13 0730  TROPONINI <0.30 <0.30 <0.30    Coagulation Studies: No results found for this basename: LABPROT, INR,  in the last 72 hours  Other: No components found with this basename: POCBNP,  No results found for this basename: DDIMER,  in the last 72 hours  Recent Labs  08/10/13 2130  HGBA1C 5.8*   No results found for this basename: CHOL, HDL, LDLCALC, TRIG, CHOLHDL,  in the last 72 hours  Recent Labs  08/10/13 2130  TSH 3.141   No results found for this basename: VITAMINB12, FOLATE, FERRITIN, TIBC, IRON, RETICCTPCT,  in the last 72 hours   Other results:  EKG   =- NSR, no ST or T wave change    Medications:    Infusions:    Scheduled Medications: . amLODipine  2.5 mg Oral QPM  . aspirin EC  81 mg Oral QHS  . carvedilol  3.125 mg Oral BID WC  . docusate sodium  100 mg Oral BID  . heparin  5,000 Units Subcutaneous 3 times per day  . isosorbide mononitrate  30 mg Oral QHS  . omega-3 acid ethyl esters  1 g Oral BID  . pantoprazole  80 mg Oral Daily  . prasugrel  10 mg Oral Daily  . sodium chloride  3 mL Intravenous Q12H  . sodium chloride  3 mL Intravenous Q12H  . sulfamethoxazole-trimethoprim  1 tablet Oral BID  . tiotropium  18 mcg Inhalation Daily    Assessment/ Plan:   Principal Problem:   Shoulder pain, left, with associated weakness Active Problems:   GERD   CAD -S/P MI-PCI AVG 5/13 then staged DES to RCA  11/06/11. ISR- PCI 11/19/12, cath 02/18/13- no ISR, + spasm   Tobacco abuse, ongoing   Shoulder pain   1. CAD.  She has been having shoulder pain for 4-5 days  - despite this, enzymes are negative.  MRi shows some shoulder abn - but none severe.  DC to home.  She may follow up with Dr. [name].  He can consider OP myoview if she is still having problems   Disposition:  DC to home today  Length of Stay: 1  Thayer Headings, Brooke Bonito., MD, [facility] 08/11/2013, 1:24 PM Office [phone] Pager [phone]

## 2013-08-26 ENCOUNTER — Ambulatory Visit: Payer: Medicare Other | Admitting: Gastroenterology

## 2013-08-30 DIAGNOSIS — J40 Bronchitis, not specified as acute or chronic: Secondary | ICD-10-CM | POA: Diagnosis not present

## 2013-08-31 IMAGING — CR DG CHEST 2V
2 series · 2 of 2 positions shown · non-contrast
Comparison: Chest radiograph performed 06/16/2010

CLINICAL DATA: Fever, sore throat and cough.  Headache.

CHEST - 2 VIEW

[view not recorded (1 of 2)]
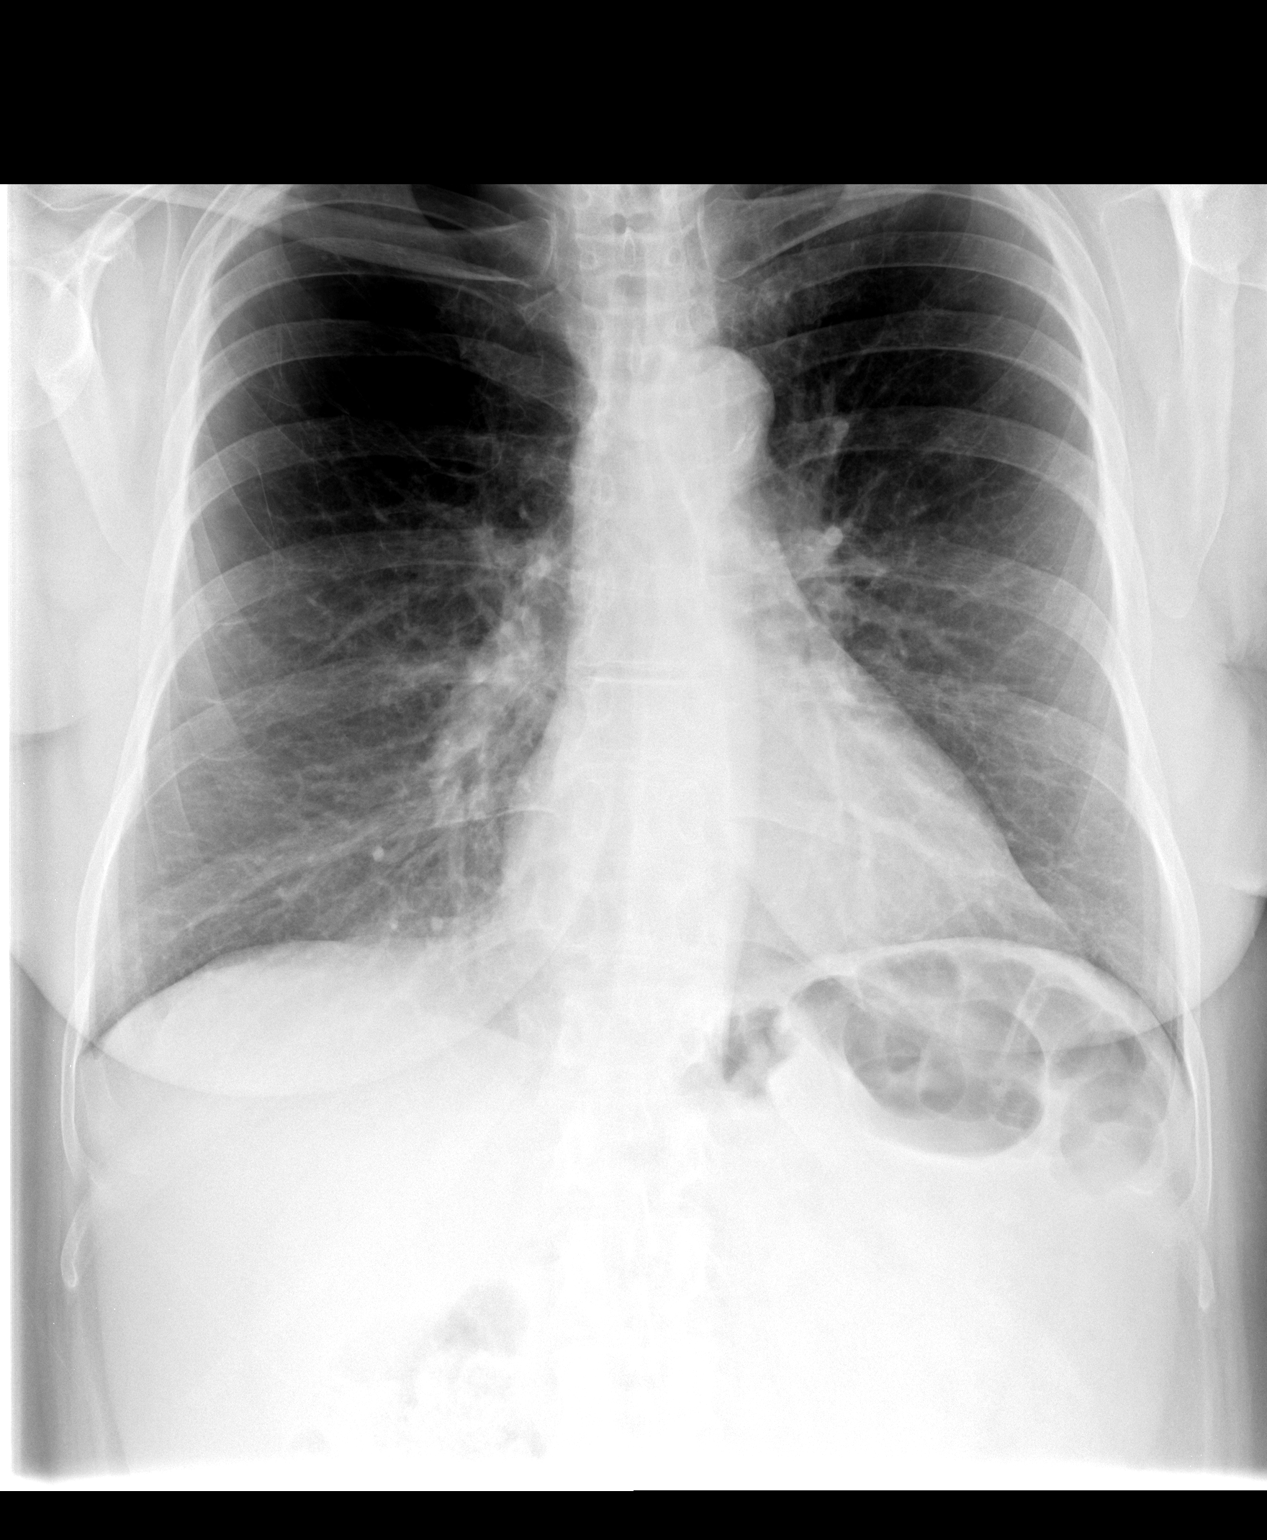

[view not recorded (2 of 2)]
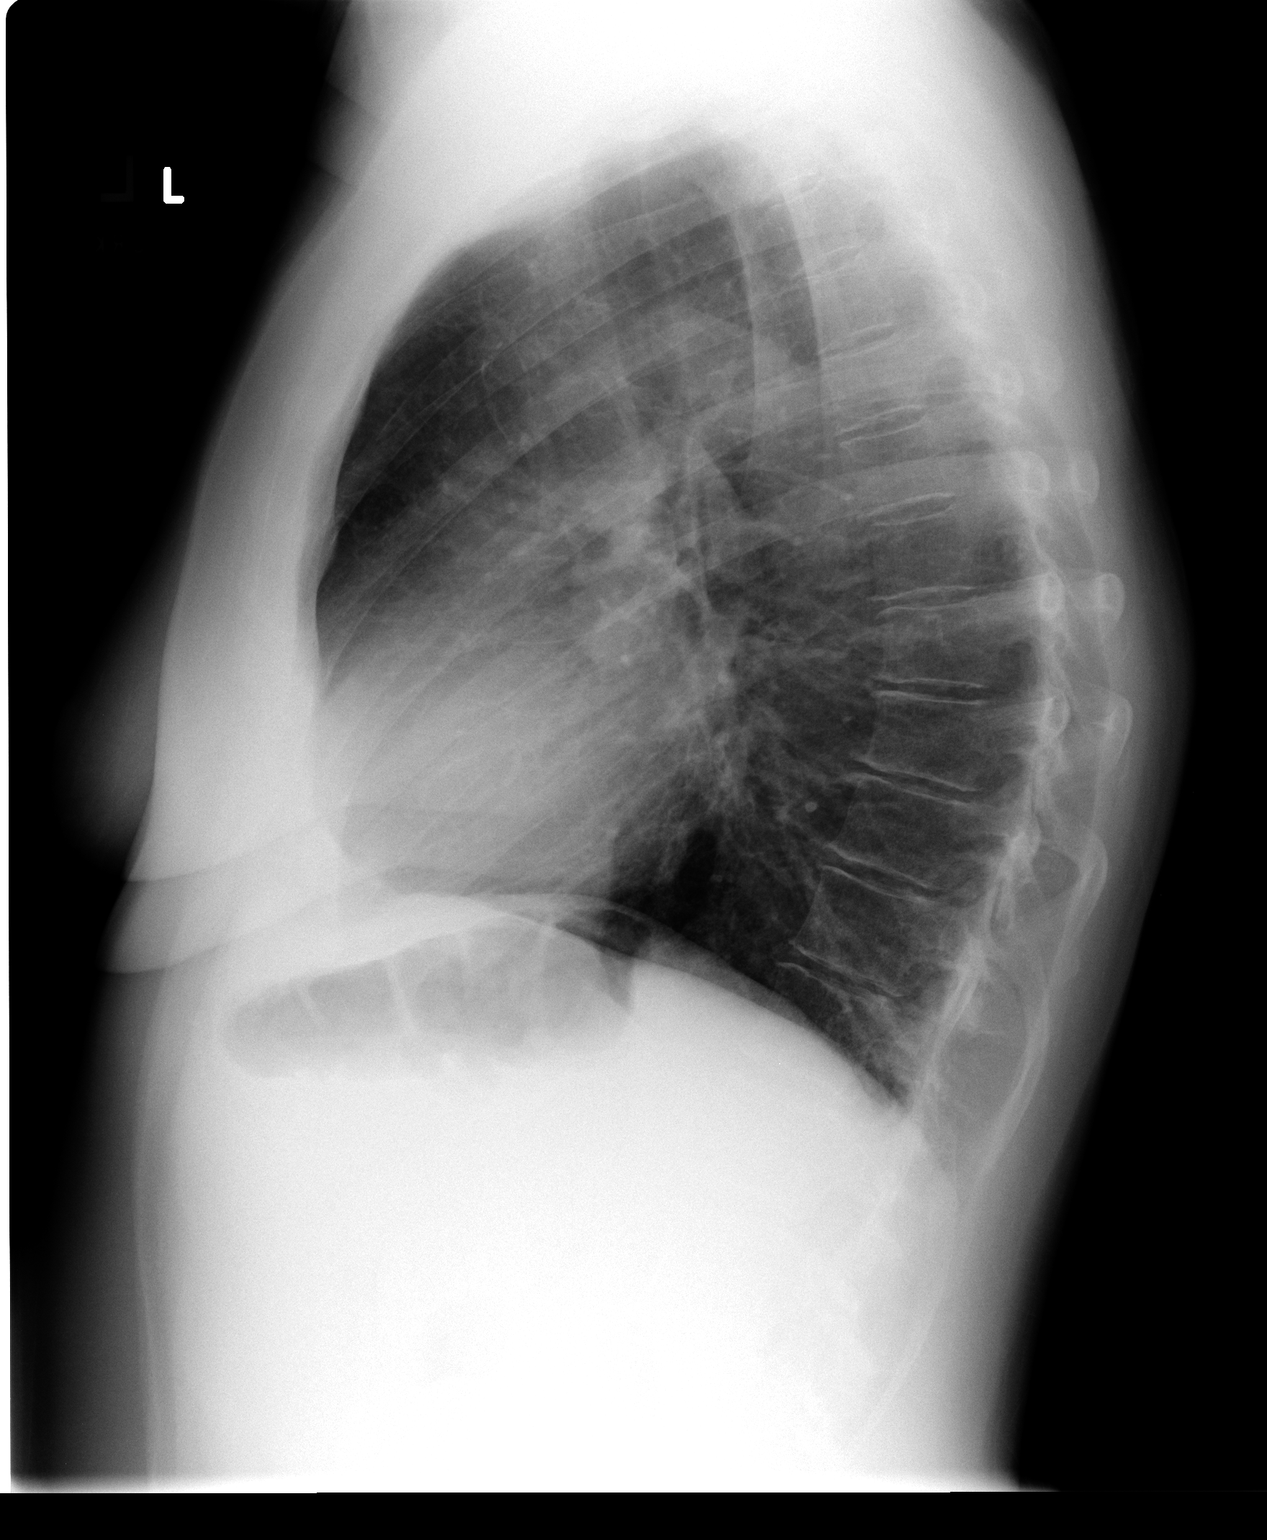

[2 of 2 positions shown; findings below may reference images not displayed]

FINDINGS: The lungs are well-aerated and clear.  Minimal bilateral
scarring is noted.  There is no evidence of focal opacification,
pleural effusion or pneumothorax.

The heart is normal in size; the mediastinal contour is within
normal limits.  No acute osseous abnormalities are seen.
IMPRESSION: No acute cardiopulmonary process seen.

## 2013-09-06 ENCOUNTER — Ambulatory Visit: Payer: Medicare Other | Admitting: Cardiology

## 2013-09-06 ENCOUNTER — Other Ambulatory Visit: Payer: Self-pay | Admitting: Cardiology

## 2013-09-06 NOTE — Telephone Encounter (Signed)
Rx was sent to pharmacy electronically. 

## 2013-09-09 ENCOUNTER — Telehealth: Payer: Self-pay | Admitting: *Deleted

## 2013-09-09 ENCOUNTER — Telehealth: Payer: Self-pay | Admitting: Cardiology

## 2013-09-09 NOTE — Telephone Encounter (Signed)
Most definitely, stents cannot be dislodged.  I agree with her communicating with PCP to treat respiratory illness.  Leonie Man, MD

## 2013-09-09 NOTE — Telephone Encounter (Signed)
Returned call and pt verified x 2.  Pt c/o chest aching w/ bronchitis.  Stated she took a z-pak about a week ago and now they are starting her on another abx, which she hasn't picked up yet.  Pt also c/o SOB and thinks the symptoms are from her coughing, but is concerned about dislodging her stents.  Pt informed bronchitis can cause CP, back pain and SOB.  RN asked if she was rx'd an inhaler, like albuterol.  Pt stated she isn't sure, but has an inhaler (unable to describe color or what it looks like).  Pt advised to contact PCP r/t what type of inhaler she has and if she needs Rx for one in addition to her abx.  Pt also stated she has COPD and informed her symptoms are leaning more toward the bronchitis w/ COPD than heart issues.  Pt also informed her stents canNOT be dislodged from coughing.  Pt again advised to contact PCP r/t this and appt if necessary.  Pt verbalized understanding and agreed w/ plan.  Message forwarded to Dr. [name] [facility]).

## 2013-09-09 NOTE — Telephone Encounter (Signed)
Pt was calling in regards to her Effient needing to be refilled. She stated that there is no refills left on it. Needs to be called into [facility] on [address]. Patient is completely our of her medicaiton.  [place]

## 2013-09-09 NOTE — Telephone Encounter (Signed)
Returned call.  Left message that refill was sent a couple of days ago and to contact pharmacy for pick up.  Also to call back before 4pm if questions.

## 2013-09-09 NOTE — Telephone Encounter (Signed)
Pt states has had bronchitis and been on antibiotic but her chest is hurting (from coughing?) and she worries about her stents.  Wants to be seen today.  I suggested calling her PCP again but she is worried about her heart.

## 2013-10-05 ENCOUNTER — Ambulatory Visit (INDEPENDENT_AMBULATORY_CARE_PROVIDER_SITE_OTHER): Payer: Medicare Other | Admitting: Gastroenterology

## 2013-10-05 ENCOUNTER — Encounter: Payer: Self-pay | Admitting: Gastroenterology

## 2013-10-05 VITALS — BP 130/74 | HR 88 | Ht 65.5 in | Wt 161.0 lb

## 2013-10-05 DIAGNOSIS — Z9861 Coronary angioplasty status: Secondary | ICD-10-CM | POA: Diagnosis not present

## 2013-10-05 DIAGNOSIS — I2510 Atherosclerotic heart disease of native coronary artery without angina pectoris: Secondary | ICD-10-CM | POA: Diagnosis not present

## 2013-10-05 DIAGNOSIS — R109 Unspecified abdominal pain: Secondary | ICD-10-CM
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
## 2013-10-05 NOTE — Patient Instructions (Signed)
Your right sided pains do not seem GI related (given normal 08/2013, 06/2013 CT scans and colonoscopy 3 years ago) Should follow this clinically.

## 2013-10-06 DIAGNOSIS — R350 Frequency of micturition: Secondary | ICD-10-CM | POA: Diagnosis not present

## 2013-10-06 DIAGNOSIS — R109 Unspecified abdominal pain: Secondary | ICD-10-CM | POA: Diagnosis not present

## 2013-10-12 ENCOUNTER — Ambulatory Visit (INDEPENDENT_AMBULATORY_CARE_PROVIDER_SITE_OTHER): Payer: Medicare Other | Admitting: Cardiology

## 2013-10-12 ENCOUNTER — Encounter: Payer: Self-pay | Admitting: Cardiology

## 2013-10-12 VITALS — BP 126/74 | HR 74 | Ht 65.5 in | Wt 162.3 lb

## 2013-10-12 DIAGNOSIS — I2510 Atherosclerotic heart disease of native coronary artery without angina pectoris: Secondary | ICD-10-CM

## 2013-10-12 DIAGNOSIS — Z9861 Coronary angioplasty status: Secondary | ICD-10-CM | POA: Diagnosis not present

## 2013-10-12 DIAGNOSIS — R0789 Other chest pain: Secondary | ICD-10-CM

## 2013-10-12 NOTE — Progress Notes (Signed)
Patient ID: Michele [name], female   DOB: Oct 26, 1955, 58 y.o.   MRN: [id number]     10/12/2013 Michele [name]   1956-05-25  [id number]  Primary Physicia [name], MD Primary Cardiologist: Roni Bread, MD  HPI:  The patient is a 58 y.o. female, followed by Dr. [name], with a past medical history significant for coronary disease. She had an inferior MI in May of 2013 treated with a circumflex DES. She had some residual disease in the RCA that was intervened on in a staged fashion in June 2013. She had a Myoview in Oct 2013 that was negative for ischemia but she had a hypotensive response. Her condition is complicated by severe anxiety. Since 2013, she has had multiple admissions and presentations to the ER for complaints of chest pain, leading to several re-look LHC and repeat NST. This was done 11/19/12 and revealed ISR of the AV groove stent at the bifurcation of the OM3. This was intervened on with a DES by Dr [name] 11/19/12. Her last NST was in November 2014, after she ruled out for an MI. It revealed no induced ischemia and her EF was 70%.   She was most recently hospitalized for atypical chest pain in March 2015. She had no changes to her EKG and normal troponins. It was attributed to left shoulder pain. She presents today for follow up of chest pain. She notes that the pain is intermittent, located in the left breast and non radiating. It is not associated with exertion, nausea or shortness of breath. It resolves spontaneously. She has not taking any nitro for this pain, because she is "scared" of it. She continues to take aspirin, effient, amlodipine, carvedilol, and imdur. She also complains of generalized weakness, back pain, leg pain, a cyst on her kidney and fatty tissue in her liver as well as bruising on her skin.     Current Outpatient Prescriptions  Medication Sig Dispense Refill  . amLODipine (NORVASC) 2.5 MG tablet Take 2.5 mg by mouth every evening.      [name] aspirin 81 MG  EC tablet Take 81 mg by mouth at bedtime.       . Calcium Carbonate-Vitamin D (CALCIUM 600+D PO) Take 1 tablet by mouth 2 (two) times daily.       . carvedilol (COREG) 3.125 MG tablet Take 3.125 mg by mouth 2 (two) times daily with a meal.      . clonazePAM (KLONOPIN) 2 MG tablet Take 2 mg by mouth 3 (three) times daily as needed for anxiety.       . Coenzyme Q10 (COQ10) 100 MG CAPS Take 100 mg by mouth daily.       [name] EFFIENT 10 MG TABS tablet TAKE 1 TABLET BY MOUTH EVERY DAY  30 tablet  9  . isosorbide mononitrate (IMDUR) 30 MG 24 hr tablet Take 30 mg by mouth at bedtime.      . nitrofurantoin, macrocrystal-monohydrate, (MACROBID) 100 MG capsule Take 100 mg by mouth 2 (two) times daily.      . nitroGLYCERIN (NITROSTAT) 0.4 MG SL tablet Place 0.4 mg under the tongue every 5 (five) minutes as needed for chest pain.      [name] omega-3 acid ethyl esters (LOVAZA) 1 G capsule Take 1 g by mouth 2 (two) times daily.      [name] omeprazole (PRILOSEC) 20 MG capsule Take 20 mg by mouth daily.      . polyethylene glycol (MIRALAX / GLYCOLAX) packet Take 17 g by  mouth daily as needed for moderate constipation.      . pravastatin (PRAVACHOL) 20 MG tablet Take 20 mg by mouth at bedtime.      [name] tiotropium (SPIRIVA) 18 MCG inhalation capsule Place 18 mcg into inhaler and inhale daily.       No current facility-administered medications for this visit.    Allergies  Allergen Reactions  . Ibuprofen Other (See Comments)    GI upset  . Aspirin Other (See Comments)     GI upset (takes low dose aspirin at night) Pt can take enteric coated  . Ciprofloxacin     Low blood sugar, shakiness  . Crestor [Rosuvastatin]     Myalgias   . Lipitor [Atorvastatin]     Myalgias   . Sulfonamide Derivatives Itching and Rash  . Wellbutrin [Bupropion] Palpitations    History   Social History  . Marital Status: Divorced    Spouse Name: N/A    Number of Children: 24  . Years of Education: N/A   Occupational History  .  Disabled     Social History Main Topics  . Smoking status: Current Every Day Smoker -- 1.00 packs/day for 40 years    Types: Cigarettes  . Smokeless tobacco: Never Used     Comment: 04/15/12 "I quit once for 2 1/2 years; smoking cessation counselor already here to visit"; done to less than 1/2 ppd (03/02/2013) - "1 pack per week" - 05/24/13  . Alcohol Use: Yes     Comment: occasional  . Drug Use: No  . Sexual Activity: Not Currently    Birth Control/ Protection: Post-menopausal   Other Topics Concern  . Not on file   Social History Narrative   Divorced mother of 4 and a grandmother 34, great-grandmother of 1    On disability, previously worked as a Educational psychologist.  Quit smoking 06/2007 but restarted 1/11 -- smoking a pack a day.  -- now a pack lasts a week.   Does not drink alcohol.   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   0 Caffeine drinks daily      Review of Systems: General: negative for chills, fever, night sweats or weight changes.  Cardiovascular: positive for chest pain, negative dyspnea on exertion, edema, orthopnea, palpitations, paroxysmal nocturnal dyspnea or shortness of breath Dermatological: positive for bruising  Respiratory: negative for cough or wheezing Urologic: negative for hematuria Abdominal: positive for nausea, negative for vomiting, diarrhea, bright red blood per rectum, melena, or hematemesis Neurologic: positive for generalized weakness without syncope All other systems reviewed and are otherwise negative except as noted above.    Blood pressure 126/74, pulse 74, height 5' 5.5" (1.664 m), weight 162 lb 4.8 oz (73.619 kg).  General appearance: middle aged WF, alert, cooperative and no distress  Neck: no carotid bruit and no JVD  Lungs: clear to auscultation bilaterally  Heart: regular rate and rhythm, S1, S2 normal, no murmur, click, rub or gallop  Extremities: no LEE  Pulses: 2+ and symmetric  Skin: warm and dry,  scattered echymosis on forearms bilaterally Neurologic: Grossly normal, 5/5 strength in all extremities Psych: pressure speech and difficult to redirect   EKG NSR, rate 74, no signs of ischemia or infarct  ASSESSMENT AND PLAN:   Atypical chest pain A: unlikely to be cardiac given numerous recent evaluations including hospitalization for this issue in March that was negative for ACS and normal nuclear stress test in November 2014 P: continue current regimen  of dual anti-platelet, beta blocker, calcium channel blocker, and imdur     PLAN  No new symptoms in setting of numerous complaints likely to be somatic disorder in setting of history of CAD. No indication for new cardiac studies. Follow up with Dr. [name]. Follow up with PCP for other issues.   Talmadge Coventry, MBA 10/12/2013 3:52 PM   I examined the patient along with Dr. Maricela Bo and I agree with his assessment and plan. She presents with a complaint of mostly atypical intermittent left sided chest pain. However, one episode did occur after ~5 minutes of exercise on the elliptical . She denies any other exertional chest discomfort since that time. Her EKG is w/o an ischemic changes and she had a normal NST in November 2014. I recommended increasing her Imdur to 45 mg daily, just as a trial to see if this would improve her symptoms. She refused to increase any of her cardiac medicines.  Her behavior is a little suspicious for drug seeking behavior, with frequent visits to multiple providers with complaints of pain (chest pain, abdominal pain, back pain, leg pain) and complaints of stress and anxiety. I told her that there are no other medicines, other than cardiac meds/ adjustments that I could offer her today. I recommended that she continue to f/u with Dr. Juliette Mangle.   Lyda Jester, PA-C 10/12/13 5:33 PM

## 2013-10-12 NOTE — Patient Instructions (Signed)
1. Continue on your same medications as you are taking them now  2.Your physician recommends that you schedule a follow-up appointment in: 6 months to see Dr. [name]

## 2013-10-12 NOTE — Assessment & Plan Note (Addendum)
A: unlikely to be cardiac given numerous recent evaluations including hospitalization for this issue in March that was negative for ACS and normal nuclear stress test in November 2014 P: patient given the option of increasing her imdur to 60 mg daily, but she declined; therefore, continue current regimen of dual anti-platelet, beta blocker, calcium channel blocker, and imdur

## 2013-10-14 ENCOUNTER — Ambulatory Visit
Admission: RE | Admit: 2013-10-14 | Discharge: 2013-10-14 | Disposition: A | Discharge status: Home / Self Care | Payer: Medicare Other | Source: Ambulatory Visit

## 2013-10-14 DIAGNOSIS — Z1231 Encounter for screening mammogram for malignant neoplasm of breast: Secondary | ICD-10-CM

## 2013-10-18 ENCOUNTER — Ambulatory Visit: Payer: Medicare Other | Admitting: Cardiology

## 2013-10-22 ENCOUNTER — Telehealth: Payer: Self-pay | Admitting: Cardiology

## 2013-10-22 DIAGNOSIS — R32 Unspecified urinary incontinence: Secondary | ICD-10-CM | POA: Diagnosis not present

## 2013-10-22 DIAGNOSIS — N949 Unspecified condition associated with female genital organs and menstrual cycle: Secondary | ICD-10-CM | POA: Diagnosis not present

## 2013-10-22 DIAGNOSIS — K6289 Other specified diseases of anus and rectum: Secondary | ICD-10-CM | POA: Diagnosis not present

## 2013-10-22 DIAGNOSIS — IMO0002 Reserved for concepts with insufficient information to code with codable children: Secondary | ICD-10-CM | POA: Diagnosis not present

## 2013-10-22 MED ORDER — CARVEDILOL 3.125 MG PO TABS: 3.1250 mg | ORAL_TABLET | Freq: Two times a day (BID) | ORAL | Status: DC

## 2013-10-22 NOTE — Telephone Encounter (Signed)
Needs refill on Carvedilol 3.125 mg twice daily  Needs to be called [facility] on [address].  Please call

## 2013-10-22 NOTE — Telephone Encounter (Signed)
Rx was sent to pharmacy electronically. 

## 2013-10-27 ENCOUNTER — Other Ambulatory Visit (HOSPITAL_COMMUNITY): Payer: Self-pay | Admitting: Physician Assistant

## 2013-10-27 ENCOUNTER — Other Ambulatory Visit (HOSPITAL_COMMUNITY): Payer: Self-pay | Admitting: Cardiology

## 2013-10-28 NOTE — Telephone Encounter (Signed)
Rx was sent to pharmacy electronically. 

## 2013-10-28 NOTE — Telephone Encounter (Signed)
Rx refill sent to patient pharmacy   

## 2013-11-03 DIAGNOSIS — N949 Unspecified condition associated with female genital organs and menstrual cycle: Secondary | ICD-10-CM | POA: Diagnosis not present

## 2013-11-18 IMAGING — CR DG LUMBAR SPINE COMPLETE 4+V
5 series · 5 of 5 positions shown · non-contrast
Comparison: 03/15/2007.

CLINICAL DATA: Fell.  Back pain.

LUMBAR SPINE - COMPLETE 4+ VIEW

[view not recorded (1 of 5)]
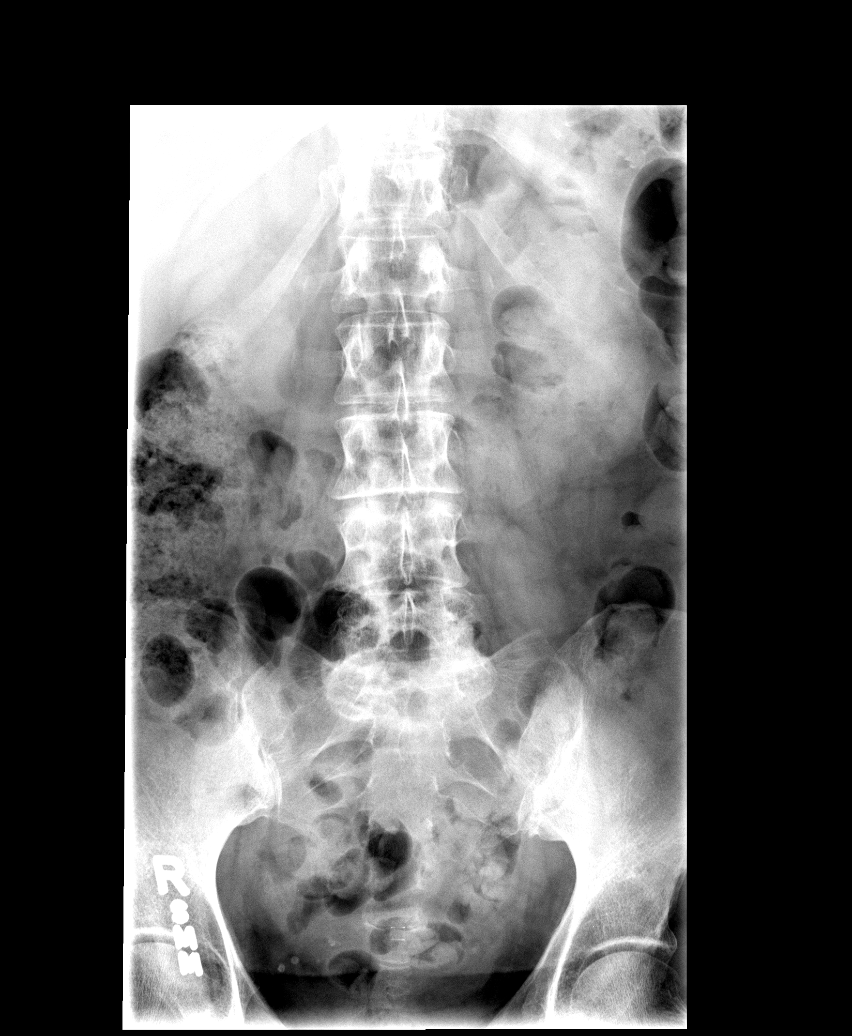

[view not recorded (2 of 5)]
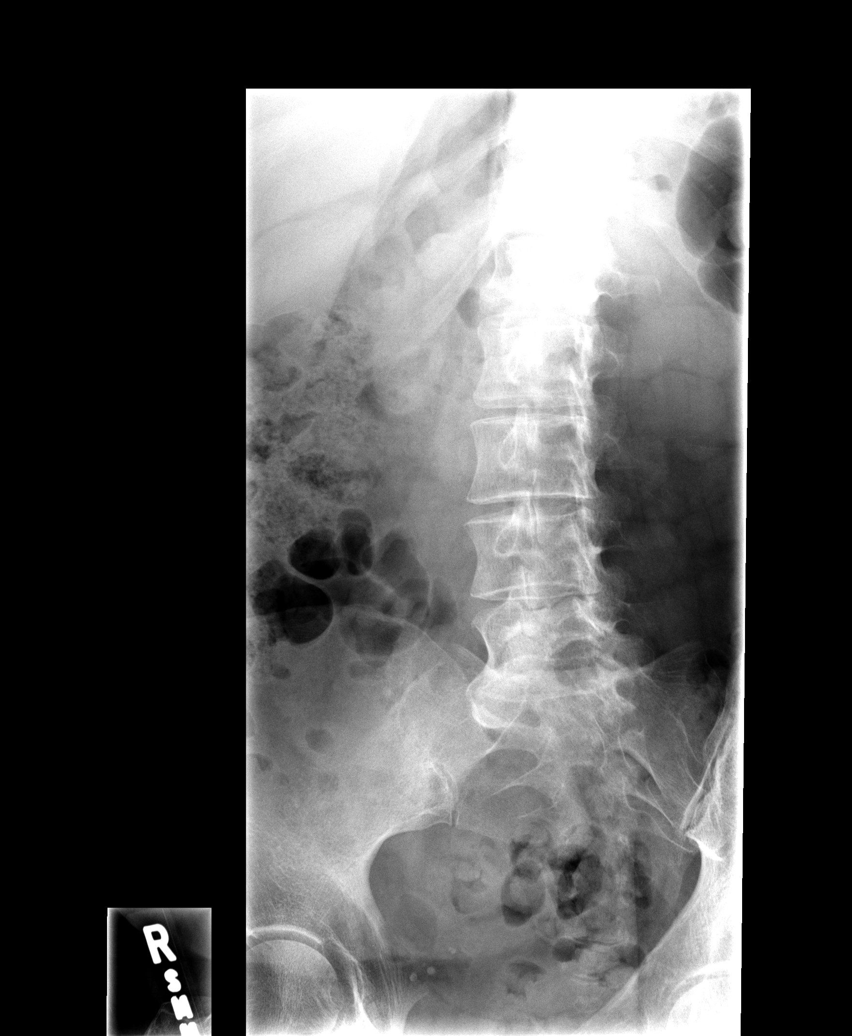

[view not recorded (3 of 5)]
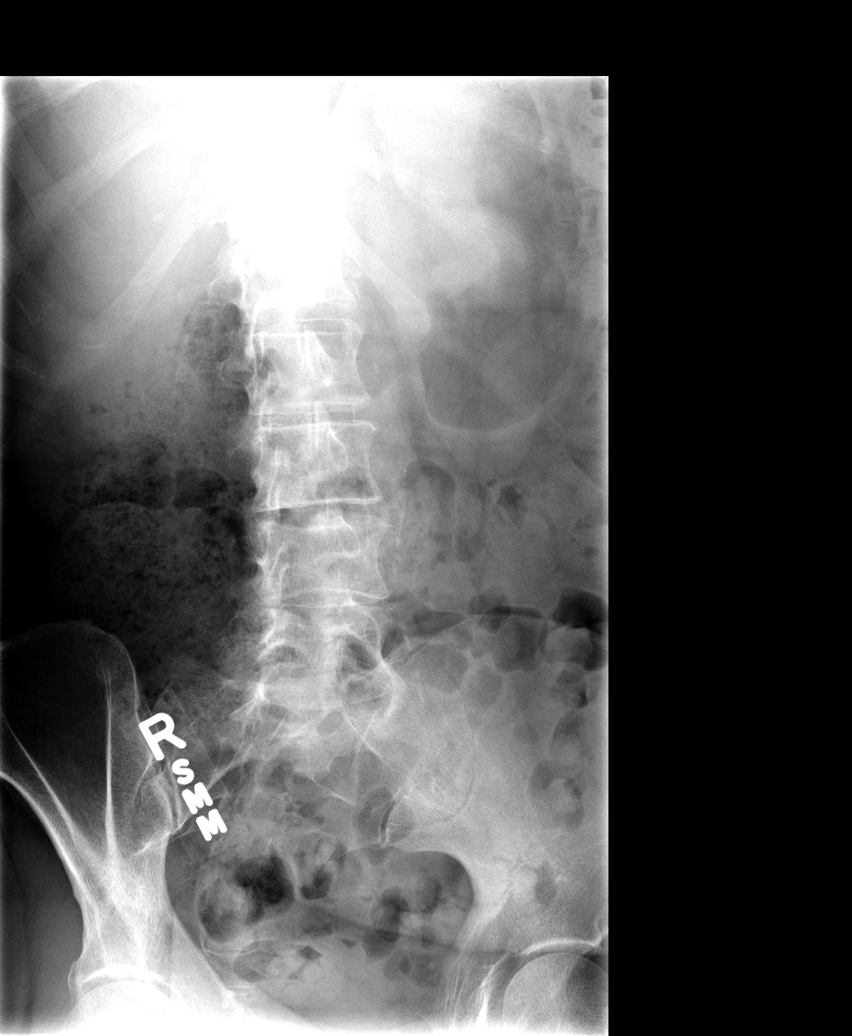

[view not recorded (4 of 5)]
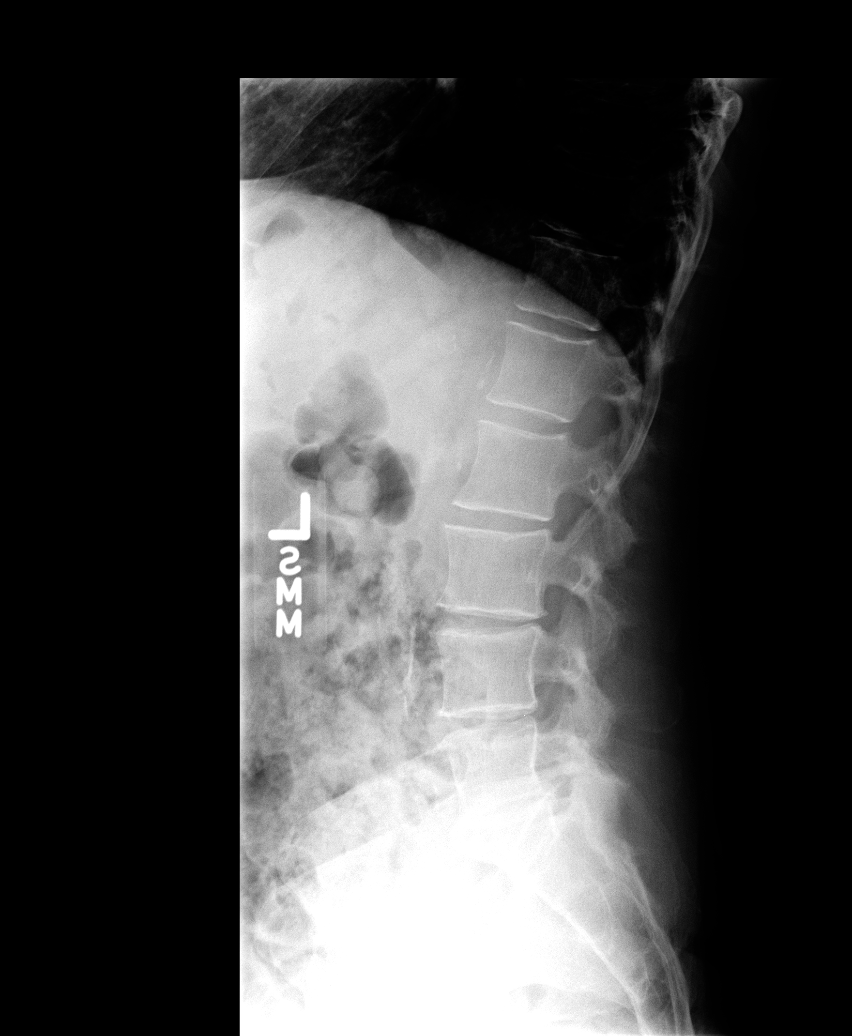

[view not recorded (5 of 5)]
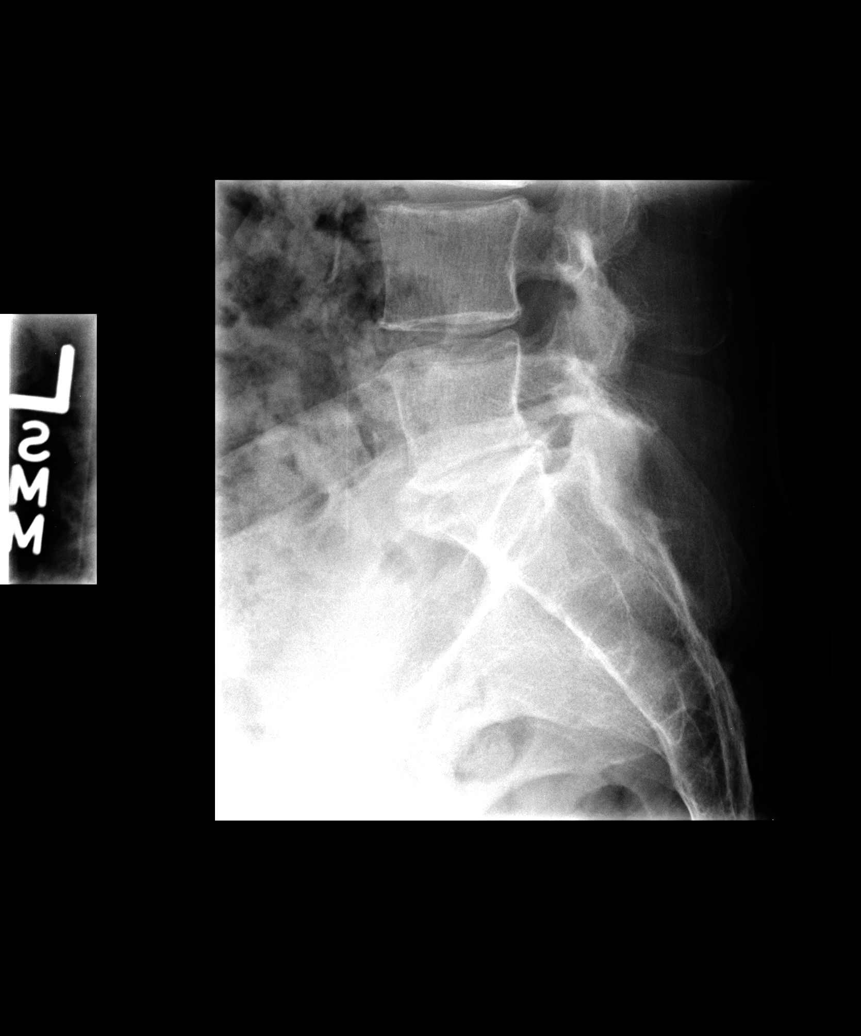

[5 of 5 positions shown; findings below may reference images not displayed]

FINDINGS: Normal alignment of the lumbar vertebral bodies.
Multilevel degenerative disc disease, slightly progressive since
the prior study and most notable at L5-S1.  No acute fracture.  The
facets are normally aligned.  No pars defects.  The visualized bony
pelvis is intact.  The SI joints appear normal.  Aortic
calcifications are noted without definite aneurysm.
IMPRESSION: 1.  Normal alignment and no acute bony findings.
2.  Slightly progressive degenerative disc disease.
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
## 2013-11-24 ENCOUNTER — Telehealth: Payer: Self-pay | Admitting: Cardiology

## 2013-11-24 ENCOUNTER — Ambulatory Visit (INDEPENDENT_AMBULATORY_CARE_PROVIDER_SITE_OTHER): Payer: Medicare Other | Admitting: Cardiology

## 2013-11-24 VITALS — BP 118/72 | HR 69 | Ht 65.5 in | Wt 166.1 lb

## 2013-11-24 DIAGNOSIS — E782 Mixed hyperlipidemia: Secondary | ICD-10-CM | POA: Diagnosis not present

## 2013-11-24 DIAGNOSIS — R079 Chest pain, unspecified: Secondary | ICD-10-CM

## 2013-11-24 DIAGNOSIS — E785 Hyperlipidemia, unspecified: Secondary | ICD-10-CM

## 2013-11-24 DIAGNOSIS — I209 Angina pectoris, unspecified: Secondary | ICD-10-CM

## 2013-11-24 DIAGNOSIS — R5382 Chronic fatigue, unspecified: Secondary | ICD-10-CM

## 2013-11-24 DIAGNOSIS — Z9861 Coronary angioplasty status: Secondary | ICD-10-CM | POA: Diagnosis not present

## 2013-11-24 DIAGNOSIS — Z79899 Other long term (current) drug therapy: Secondary | ICD-10-CM | POA: Diagnosis not present

## 2013-11-24 DIAGNOSIS — I201 Angina pectoris with documented spasm: Secondary | ICD-10-CM

## 2013-11-24 DIAGNOSIS — R5381 Other malaise: Secondary | ICD-10-CM

## 2013-11-24 DIAGNOSIS — R0789 Other chest pain: Secondary | ICD-10-CM | POA: Diagnosis not present

## 2013-11-24 DIAGNOSIS — R5383 Other fatigue: Secondary | ICD-10-CM

## 2013-11-24 DIAGNOSIS — I2510 Atherosclerotic heart disease of native coronary artery without angina pectoris: Secondary | ICD-10-CM | POA: Diagnosis not present

## 2013-11-24 DIAGNOSIS — F17200 Nicotine dependence, unspecified, uncomplicated: Secondary | ICD-10-CM

## 2013-11-24 DIAGNOSIS — I10 Essential (primary) hypertension: Secondary | ICD-10-CM

## 2013-11-24 DIAGNOSIS — F411 Generalized anxiety disorder: Secondary | ICD-10-CM

## 2013-11-24 DIAGNOSIS — R002 Palpitations: Secondary | ICD-10-CM

## 2013-11-24 MED ORDER — CLOPIDOGREL BISULFATE 75 MG PO TABS: 75.0000 mg | ORAL_TABLET | Freq: Every day | ORAL | Status: DC

## 2013-11-24 NOTE — Telephone Encounter (Signed)
New Prob   Pt has some questions regarding her medications and last visit. Please call.

## 2013-11-24 NOTE — Patient Instructions (Addendum)
STOP AMLODIPINE  STOP EFFIENT AFTER THIS BOTTLES IS COMPLETE THEN START PLAVIX (CLOPIDOGREL) 75 MG . AFTER TAKING  MEDICATION FOR 2 WEEKS THEN HAVE LAB WORK (P2Y12)  DONE AT [place].  CONTINUE TAKING ASPIRIN DAILY  LABS- in Nov 2015 cmp,lipid---will mail lab slip to you.  Your physician wants you to follow-up in    Mayo 2015          Dr Harding---30 min appointment. You will receive a reminder letter in the mail two months in advance. If you don't receive a letter, please call our office to schedule the follow-up appointment.

## 2013-11-24 NOTE — Telephone Encounter (Signed)
Patient was calling stating she went to pharmacy to pick up plavix and was told it was for cholesterol(?) - may have been confusion with pravastatin. RN informed her of the difference between the two medications. Patient also states when she went to pick up plavix, she was told her insurance would not pay for it. RN called pharmacy and was informed that since patient picked up effient on 6/9, it shows up as duplicate therapy, so pharmacist advised that patient wait til closer to time that she will need plavix to go pick up. Patient was notified of this information and she will call back if issues with plavix arise again.

## 2013-11-28 ENCOUNTER — Encounter: Payer: Self-pay | Admitting: Cardiology

## 2013-11-28 DIAGNOSIS — R002 Palpitations: Secondary | ICD-10-CM | POA: Insufficient documentation

## 2013-11-28 NOTE — Assessment & Plan Note (Signed)
This is a "new" complaint for her.  Sounds like PVCs/PACs - potentially bi or tri-geminy.  Continue BB - may be able to titrate up with CCB off.[name]

## 2013-11-28 NOTE — Progress Notes (Signed)
PCP: [name], MD  Clinic Note: Chief Complaint  Patient presents with  . Follow-up    pt c/o of SOB and irregular heart beat, pt stated she's been falling a lot lately denied dizziness or blocking out.    HPI: Michele [name] is a 58 y.o. female with a Cardiovascular Problem List below who presents today for ~ 1 month follow-up for CP visit -- thought to be non-cardiac. In January & March, she went to the ER with CP.  Observation overnight in March - r/o MI.  Interval History: She presents today with a complaint of intermittent spells of heart flip-flopping that takes her breath away.  The spells are short-lived & resolve spontaneously along with other symptoms.  No exertional chest pain/pressure of dyspnea. She is under more than usual stress with her mother just discharged to a Rehab center.  Instead of taking time for herself with others caring for her mother, she is spending her days out @ rehab fussing over things, then coming home behind in chores.  She just feels weak & tired. No PND, orthopnea or edema. No syncope/near syncope.  No TIA/amaurosis fugax symptoms. No melena, hematochezia, hematuria, or epstaxis. No claudication.  Pertinent Past Medical History  Diagnosis Date  . ANXIETY   . COPD     PFTs 07/2010 and 12/2011 - mod obstructive disease & decreased DLCO w/minimal response to bronchodilators & increased residual vol. consistent with air trapping   . DEPRESSION   . DYSLIPIDEMIA, goal LDL < 70   . Hiatal hernia   . CAD S/P percutaneous coronary angioplasty 10/2011, 11/2011; 11/20/2012    a) 5/'13: Inflat STEMI - PCI to Cx-OM; b) 6/'13: Staged PCI to mRCA, ~50% distal RCA lesion; c) Unstable Angina 6/'14: RCA stent patent, ISR of distal Cx stent --> bifurcation PCI - new stent (Promus Premier DES 2.5 mm x 12 mm - 2.65 mm) across OM3 into AVGroove & PTCA of OM3 stent d) Myoview ST 03/2012 & 04/2013: Inferolateral Scar, no ischemia; e) Cath 02/2013: Patent Cx-OM3-AVg  stents & RCA stent, mild dRCA & LAD disease  . History ST elevation myocardial infarction (STEMI) of inferolateral wall 10/2011    100% LCx-OM  -- PCI; Echo: EF 50-50%, inferolateral Hypokinesis.  . Tobacco abuse     Restarted smoking after initially quitting post-MI  . History of nuclear stress test 03/03/2012/ 04/2013    bruce protocol myoview; large, mostly fixed inferolateral scar in LCx region; inferolateral akinesis; hypertensive response to exercise; target HR acheived; abnormal, but low risk -- stable in 2014, confirmed Cath results.  . Hypertension, Essential     Prior Cardiac Evaluation and Past Surgical History: Procedure Laterality Date  . Coronary angioplasty with stent placement  a) 10/10/11, b)11/06/11, c) 11/19/12   a Inferolateral STEMI: PCI of mid LCx; 2 overlapping Promus Element DES 2.5 mm x 12 mm ; 2.5 mm x 8 mm (postdilated with stent 2.75 mm) - distal stent extends into OM 3   b Staged PCI of midRCA: Promus Element DES 2.5 mm x 24 mm- post-dilated to ~2.75-2.8 mm   c Significant distal ISR of stent in AV groove circumflex 2 OM 3: Bifurcation treatment with new stent placed from AV groove circumflex place across OM 3 (Promus Premier 2.5 mm x 12 mm postdilated to 2.65 mm; Cutting Balloon PTCA of stented ostial OM 3 with a 2.0 balloon:  . Doppler echocardiography  May 2013    EF 50-55%, mild basal inferolateral hypokinesis.  [name]  Nm myoview ltd  October 2013    Walk 9 minutes, 8 METS; no ischemia or infarction. The inferolateral scar, consistent with a Circumflex infarct   . Cpet  09/07/2012    wirh PFTs; peak VO2 69% predicted; impaired CV status - ischemic myocardial dysfunction; abrnomal pulm response - mild vent-perfusion mismatch with impaired pulm circulation; mod obstructive limitations (PFTs)   MEDICATIONS AND ALLERGIES REVIEWED IN EPIC No Change in Social and Family History  ROS: A comprehensive Review of Systems - Negative except symptoms noted in HPI along with Chronic  MSK pains, anxiety.  PHYSICAL EXAM BP 118/72  Pulse 69  Ht 5' 5.5" (1.664 m)  Wt 166 lb 1.6 oz (75.342 kg)  BMI 27.21 kg/m2 General appearance: alert, cooperative, appears more than stated age, no distress; anxious questions appropriately.  Neck: no adenopathy, no carotid bruit and no JVD  Lungs: clear to auscultation bilaterally with the exception of mild diffuse crackles, normal percussion bilaterally and non-labored  Heart: regular rate and rhythm, S1, S2 normal, no murmur, click, rub or gallop; nondisplaced PMI  Abdomen: soft, mild tenderness; bowel sounds normal; no masses, no organomegaly;  Extremities: extremities normal, atraumatic, no cyanosis, and no edema  Pulses: 2+ and symmetric;  Skin: Dry, leathery-appearing skin from long-term smoking  Neurologic: Mental status: Alert, oriented, thought content appropriate  Cranial nerves: normal (II-XII grossly intact)   Adult ECG Report  Rate: 69 ;  Rhythm: normal sinus rhythm; Normal EKG  Recent Labs:  No lipids since November.  ASSESSMENT / PLAN: CAD -S/P MI-PCI AVG 5/13 then staged DES to RCA  11/06/11. ISR- PCI 11/19/12, cath 02/18/13- no ISR, + spasm Stable.  She has multiple complaints of different chest symptoms, making it very hard to know when it is real. She has a high anxiety level & fibromyalgia.   At this point, we can convert from Effient to Plavix & if P2Y12 Assay is adequate, can d/c ASA (to help her intermittent GERD). On pravastatin -- the only one she tolerates. On BB & Nitrate -- will d/c CCB to allow a bit more BP to avoid dizziness & potentially help her fatigue.  Coronary artery spasm With Carvedilol - can d/c Amlodipine as mentioned.  Continue Imdur -- double up the dose if SL NTG used.  Essential hypertension To avoid orthostatic symptoms & allow for potentially increased Energy level - will stop Amlodipine.   Atypical chest pain Multiple potential etiologies -- MSK pain & GERD are two likely causes. May  need intermittent ST to assess if true ischemia exists.  ANXIETY No matter how hard I try - she will not take a true anxiolytic.  She NEEDS a PCP to help with this concerning condition.  Dyslipidemia, goal LDL below 70 Has been well controlled.  Is on Pravastatin (the best we could do after multiple previous choices). Recheck labs before next f/u visit.  NICOTINE ADDICTION - Tobacco Abuse, long standing We talked briefly about her smoking - but honestly, she is not interested in quitting.  Heart palpitations This is a "new" complaint for her.  Sounds like PVCs/PACs - potentially bi or tri-geminy.  Continue BB - may be able to titrate up with CCB off..    Orders Placed This Encounter  Procedures  . Comprehensive metabolic panel    Standing Status: Future     Number of Occurrences:      Standing Expiration Date: 11/25/2014    Order Specific Question:  Has the patient fasted?    Answer:  Yes  . Lipid panel    Standing Status: Future     Number of Occurrences:      Standing Expiration Date: 11/25/2014    Order Specific Question:  Has the patient fasted?    Answer:  Yes  . Platelet inhibition p2y12  . EKG 12-Lead   Meds ordered this encounter  Medications  . clopidogrel (PLAVIX) 75 MG tablet    Sig: Take 1 tablet (75 mg total) by mouth daily with breakfast.    Dispense:  30 tablet    Refill:  11    Followup: 6 months  DAVID W. [name], M.D., M.S. Interventional Cardiologist [facility]

## 2013-11-28 NOTE — Assessment & Plan Note (Signed)
We talked briefly about her smoking - but honestly, she is not interested in quitting.

## 2013-11-28 NOTE — Assessment & Plan Note (Signed)
To avoid orthostatic symptoms & allow for potentially increased Energy level - will stop Amlodipine.

## 2013-11-28 NOTE — Assessment & Plan Note (Signed)
Multiple potential etiologies -- MSK pain & GERD are two likely causes. May need intermittent ST to assess if true ischemia exists.

## 2013-11-28 NOTE — Assessment & Plan Note (Signed)
Stable.  She has multiple complaints of different chest symptoms, making it very hard to know when it is real. She has a high anxiety level & fibromyalgia.   At this point, we can convert from Effient to Plavix & if P2Y12 Assay is adequate, can d/c ASA (to help her intermittent GERD). On pravastatin -- the only one she tolerates. On BB & Nitrate -- will d/c CCB to allow a bit more BP to avoid dizziness & potentially help her fatigue.

## 2013-11-28 NOTE — Assessment & Plan Note (Signed)
With Carvedilol - can d/c Amlodipine as mentioned.  Continue Imdur -- double up the dose if SL NTG used.

## 2013-11-28 NOTE — Assessment & Plan Note (Signed)
Has been well controlled.  Is on Pravastatin (the best we could do after multiple previous choices). Recheck labs before next f/u visit.

## 2013-11-28 NOTE — Assessment & Plan Note (Signed)
No matter how hard I try - she will not take a true anxiolytic.  She NEEDS a PCP to help with this concerning condition.
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
## 2013-12-07 ENCOUNTER — Other Ambulatory Visit: Payer: Self-pay | Admitting: Nurse Practitioner

## 2013-12-07 ENCOUNTER — Telehealth: Payer: Self-pay | Admitting: Nurse Practitioner

## 2013-12-07 ENCOUNTER — Telehealth: Payer: Self-pay | Admitting: *Deleted

## 2013-12-07 MED ORDER — PANTOPRAZOLE SODIUM 40 MG PO TBEC: 40.0000 mg | DELAYED_RELEASE_TABLET | Freq: Every day | ORAL | Status: DC

## 2013-12-07 NOTE — Telephone Encounter (Signed)
Pt called to report that she was switched to plavix by her cardiologist and she is also on prilosec.  I advised that I will send in a Rx for protonix 40mg  daily, #30. Six refills.  She was also concerned about possible interaxn between plavix and ASA.  I advised that these two meds are commonly used together and we are comfortable using them.  She will let us know if her insurance does not cover the protonix.  If not, we may need to try pepcid 20mg .

## 2013-12-07 NOTE — Telephone Encounter (Signed)
Called for prior auth on ClOPIDOGREL- automatic instruction given that clopidogrel does not need a prior auth. EFFIENT was discontinue.  Patient will be having a p2y12 drawn 2 weeks after starting Clopidogrel. [facility]ified  [facility]spoke to [facility].  She states medication went through to fill.

## 2013-12-08 NOTE — Telephone Encounter (Signed)
Sounds fine.  Leonie Man, MD

## 2013-12-09 NOTE — Telephone Encounter (Signed)
Rx refill sent to patient pharmacy   

## 2013-12-15 ENCOUNTER — Telehealth: Payer: Self-pay | Admitting: Cardiology

## 2013-12-15 NOTE — Telephone Encounter (Signed)
Calling because the the medication that she is was for her indigestion and hernia and acid reflex is not compatible wih Plavix . She called and the doctor on call prescribe her something else and when he went to pick it up the pharmacist said it was in the same family that she is snot supposed to take along with Plavix . Not sure of the names of the medication .[name] Please call    Thanks

## 2013-12-16 NOTE — Telephone Encounter (Signed)
Returned call to patient. States she was switched from effient to plavix and could no longer take omeprazole because of an interaction with plavix and omeprazole. She called on-call provider and they called in pantoprazole to pharmacy. Patient states she went to pick it up and was told it was in same class of medications so she did not get it. She is concerned over taking this medication. RN looked up medication reaction in [employer] and read off information to patient that if both a PPI and plavix are indicated, pantoprazole is preferred. Patient will try pantoprazole.

## 2013-12-16 NOTE — Telephone Encounter (Signed)
Patient did not hear from anyone yesterday regarding her medication questions.

## 2013-12-18 ENCOUNTER — Emergency Department (HOSPITAL_COMMUNITY): Payer: Medicare Other

## 2013-12-18 ENCOUNTER — Observation Stay (HOSPITAL_COMMUNITY)
Admission: EM | Admit: 2013-12-18 | Discharge: 2013-12-19 | Disposition: A | Discharge status: Home / Self Care | Payer: Medicare Other | Attending: Internal Medicine | Admitting: Internal Medicine

## 2013-12-18 ENCOUNTER — Encounter (HOSPITAL_COMMUNITY): Payer: Self-pay | Admitting: Emergency Medicine

## 2013-12-18 DIAGNOSIS — R200 Anesthesia of skin: Secondary | ICD-10-CM | POA: Diagnosis present

## 2013-12-18 DIAGNOSIS — Z9861 Coronary angioplasty status: Secondary | ICD-10-CM | POA: Insufficient documentation

## 2013-12-18 DIAGNOSIS — F17200 Nicotine dependence, unspecified, uncomplicated: Secondary | ICD-10-CM | POA: Diagnosis not present

## 2013-12-18 DIAGNOSIS — B009 Herpesviral infection, unspecified: Secondary | ICD-10-CM

## 2013-12-18 DIAGNOSIS — K449 Diaphragmatic hernia without obstruction or gangrene: Secondary | ICD-10-CM | POA: Diagnosis not present

## 2013-12-18 DIAGNOSIS — G8929 Other chronic pain: Secondary | ICD-10-CM | POA: Diagnosis not present

## 2013-12-18 DIAGNOSIS — F419 Anxiety disorder, unspecified: Secondary | ICD-10-CM | POA: Diagnosis present

## 2013-12-18 DIAGNOSIS — F329 Major depressive disorder, single episode, unspecified: Secondary | ICD-10-CM

## 2013-12-18 DIAGNOSIS — R002 Palpitations: Secondary | ICD-10-CM

## 2013-12-18 DIAGNOSIS — F411 Generalized anxiety disorder: Secondary | ICD-10-CM

## 2013-12-18 DIAGNOSIS — R079 Chest pain, unspecified: Secondary | ICD-10-CM

## 2013-12-18 DIAGNOSIS — R0789 Other chest pain: Secondary | ICD-10-CM | POA: Diagnosis not present

## 2013-12-18 DIAGNOSIS — I2510 Atherosclerotic heart disease of native coronary artery without angina pectoris: Secondary | ICD-10-CM | POA: Diagnosis not present

## 2013-12-18 DIAGNOSIS — I252 Old myocardial infarction: Secondary | ICD-10-CM | POA: Diagnosis not present

## 2013-12-18 DIAGNOSIS — I25119 Atherosclerotic heart disease of native coronary artery with unspecified angina pectoris: Secondary | ICD-10-CM

## 2013-12-18 DIAGNOSIS — J438 Other emphysema: Secondary | ICD-10-CM | POA: Diagnosis not present

## 2013-12-18 DIAGNOSIS — Z79899 Other long term (current) drug therapy: Secondary | ICD-10-CM | POA: Insufficient documentation

## 2013-12-18 DIAGNOSIS — E785 Hyperlipidemia, unspecified: Secondary | ICD-10-CM | POA: Diagnosis not present

## 2013-12-18 DIAGNOSIS — Z7902 Long term (current) use of antithrombotics/antiplatelets: Secondary | ICD-10-CM | POA: Diagnosis not present

## 2013-12-18 DIAGNOSIS — R42 Dizziness and giddiness: Secondary | ICD-10-CM | POA: Diagnosis not present

## 2013-12-18 DIAGNOSIS — K2100 Gastro-esophageal reflux disease with esophagitis, without bleeding: Secondary | ICD-10-CM

## 2013-12-18 DIAGNOSIS — I10 Essential (primary) hypertension: Secondary | ICD-10-CM

## 2013-12-18 DIAGNOSIS — Z872 Personal history of diseases of the skin and subcutaneous tissue: Secondary | ICD-10-CM | POA: Insufficient documentation

## 2013-12-18 DIAGNOSIS — Z7982 Long term (current) use of aspirin: Secondary | ICD-10-CM | POA: Insufficient documentation

## 2013-12-18 DIAGNOSIS — J4489 Other specified chronic obstructive pulmonary disease: Secondary | ICD-10-CM

## 2013-12-18 DIAGNOSIS — F3289 Other specified depressive episodes: Secondary | ICD-10-CM

## 2013-12-18 DIAGNOSIS — G4700 Insomnia, unspecified: Secondary | ICD-10-CM | POA: Diagnosis not present

## 2013-12-18 DIAGNOSIS — R0781 Pleurodynia: Secondary | ICD-10-CM | POA: Diagnosis present

## 2013-12-18 DIAGNOSIS — R030 Elevated blood-pressure reading, without diagnosis of hypertension: Secondary | ICD-10-CM

## 2013-12-18 DIAGNOSIS — R209 Unspecified disturbances of skin sensation: Secondary | ICD-10-CM | POA: Diagnosis not present

## 2013-12-18 DIAGNOSIS — R600 Localized edema: Secondary | ICD-10-CM

## 2013-12-18 DIAGNOSIS — M5412 Radiculopathy, cervical region: Secondary | ICD-10-CM

## 2013-12-18 DIAGNOSIS — R7309 Other abnormal glucose: Secondary | ICD-10-CM

## 2013-12-18 DIAGNOSIS — R569 Unspecified convulsions: Secondary | ICD-10-CM

## 2013-12-18 DIAGNOSIS — M8580 Other specified disorders of bone density and structure, unspecified site: Secondary | ICD-10-CM

## 2013-12-18 DIAGNOSIS — E559 Vitamin D deficiency, unspecified: Secondary | ICD-10-CM

## 2013-12-18 DIAGNOSIS — I7389 Other specified peripheral vascular diseases: Secondary | ICD-10-CM

## 2013-12-18 DIAGNOSIS — G2581 Restless legs syndrome: Secondary | ICD-10-CM | POA: Diagnosis not present

## 2013-12-18 DIAGNOSIS — J449 Chronic obstructive pulmonary disease, unspecified: Secondary | ICD-10-CM

## 2013-12-18 DIAGNOSIS — K219 Gastro-esophageal reflux disease without esophagitis: Secondary | ICD-10-CM | POA: Diagnosis present

## 2013-12-18 DIAGNOSIS — M25512 Pain in left shoulder: Secondary | ICD-10-CM

## 2013-12-18 DIAGNOSIS — Z720 Tobacco use: Secondary | ICD-10-CM

## 2013-12-18 DIAGNOSIS — D1720 Benign lipomatous neoplasm of skin and subcutaneous tissue of unspecified limb: Secondary | ICD-10-CM

## 2013-12-18 DIAGNOSIS — J441 Chronic obstructive pulmonary disease with (acute) exacerbation: Secondary | ICD-10-CM | POA: Insufficient documentation

## 2013-12-18 DIAGNOSIS — I201 Angina pectoris with documented spasm: Secondary | ICD-10-CM

## 2013-12-18 HISTORY — DX: Inflammatory liver disease, unspecified: K75.9

## 2013-12-18 HISTORY — DX: Chronic kidney disease, unspecified: N18.9

## 2013-12-18 LAB — CREATININE, SERUM
Creatinine, Ser: 0.8 mg/dL (ref 0.50–1.10)
GFR calc Af Amer: >90 mL/min (ref >90)
GFR calc non Af Amer: 79 mL/min — ABNORMAL LOW (ref >90)

## 2013-12-18 LAB — BASIC METABOLIC PANEL
Anion gap: 11 (ref 5–15)
BUN: 14 mg/dL (ref 6–23)
CO2: 26 mEq/L (ref 19–32)
Calcium: 9.2 mg/dL (ref 8.4–10.5)
Chloride: 105 mEq/L (ref 96–112)
Creatinine, Ser: 0.61 mg/dL (ref 0.50–1.10)
GFR calc Af Amer: >90 mL/min (ref >90)
GFR calc non Af Amer: >90 mL/min (ref >90)
Glucose, Bld: 190 mg/dL — ABNORMAL HIGH (ref 70–99)
Potassium: 4.5 mEq/L (ref 3.7–5.3)
Sodium: 142 mEq/L (ref 137–147)

## 2013-12-18 LAB — CBC
HCT: 40.2 % (ref 36.0–46.0)
HCT: 45 % (ref 36.0–46.0)
Hemoglobin: 13.3 g/dL (ref 12.0–15.0)
Hemoglobin: 14.4 g/dL (ref 12.0–15.0)
MCH: 29.4 pg (ref 26.0–34.0)
MCH: 29.6 pg (ref 26.0–34.0)
MCHC: 33.1 g/dL (ref 30.0–36.0)
MCHC: 32 g/dL (ref 30.0–36.0)
MCV: 88.9 fL (ref 78.0–100.0)
MCV: 92.4 fL (ref 78.0–100.0)
Platelets: 164 10*3/uL (ref 150–400)
Platelets: 180 10*3/uL (ref 150–400)
RBC: 4.52 MIL/uL (ref 3.87–5.11)
RBC: 4.87 MIL/uL (ref 3.87–5.11)
RDW: 13.3 % (ref 11.5–15.5)
RDW: 13.3 % (ref 11.5–15.5)
WBC: 5 10*3/uL (ref 4.0–10.5)
WBC: 6.8 10*3/uL (ref 4.0–10.5)

## 2013-12-18 LAB — I-STAT TROPONIN, ED: Troponin i, poc: 0 ng/mL (ref 0.00–0.08)

## 2013-12-18 LAB — TROPONIN I
Troponin I: <0.3 ng/mL (ref <0.30)
Troponin I: <0.3 ng/mL (ref <0.30)

## 2013-12-18 LAB — PRO B NATRIURETIC PEPTIDE: Pro B Natriuretic peptide (BNP): 105.8 pg/mL (ref 0–125)

## 2013-12-18 MED ORDER — ASPIRIN 81 MG PO TBEC: 81.0000 mg | DELAYED_RELEASE_TABLET | Freq: Every day | ORAL | Status: DC

## 2013-12-18 MED ORDER — ENOXAPARIN SODIUM 40 MG/0.4ML ~~LOC~~ SOLN
40.0000 mg | SUBCUTANEOUS | Status: DC
  Administered 2013-12-18: 40 mg via SUBCUTANEOUS
  Filled 2013-12-18 (×2): qty 0.4

## 2013-12-18 MED ORDER — TIOTROPIUM BROMIDE MONOHYDRATE 18 MCG IN CAPS
18.0000 ug | ORAL_CAPSULE | Freq: Every day | RESPIRATORY_TRACT | Status: DC
  Administered 2013-12-18: 18 ug via RESPIRATORY_TRACT
  Filled 2013-12-18: qty 5

## 2013-12-18 MED ORDER — ISOSORBIDE MONONITRATE ER 30 MG PO TB24
30.0000 mg | ORAL_TABLET | Freq: Every day | ORAL | Status: DC
  Administered 2013-12-18: 30 mg via ORAL
  Filled 2013-12-18 (×2): qty 1

## 2013-12-18 MED ORDER — MORPHINE SULFATE 2 MG/ML IJ SOLN
1.0000 mg | INTRAMUSCULAR | Status: DC | PRN
  Administered 2013-12-18: 1 mg via INTRAVENOUS
  Filled 2013-12-18: qty 1

## 2013-12-18 MED ORDER — OXYCODONE HCL 5 MG PO TABS
5.0000 mg | ORAL_TABLET | Freq: Four times a day (QID) | ORAL | Status: DC | PRN
  Filled 2013-12-18: qty 1

## 2013-12-18 MED ORDER — CLONAZEPAM 1 MG PO TABS
2.0000 mg | ORAL_TABLET | Freq: Three times a day (TID) | ORAL | Status: DC | PRN
  Administered 2013-12-18: 2 mg via ORAL
  Filled 2013-12-18: qty 2

## 2013-12-18 MED ORDER — MORPHINE SULFATE 4 MG/ML IJ SOLN
4.0000 mg | Freq: Once | INTRAMUSCULAR | Status: AC
  Administered 2013-12-18: 4 mg via INTRAVENOUS
  Filled 2013-12-18: qty 1

## 2013-12-18 MED ORDER — CLOPIDOGREL BISULFATE 75 MG PO TABS
75.0000 mg | ORAL_TABLET | Freq: Every day | ORAL | Status: DC
  Administered 2013-12-19: 75 mg via ORAL
  Filled 2013-12-18 (×2): qty 1

## 2013-12-18 MED ORDER — PANTOPRAZOLE SODIUM 40 MG PO TBEC
40.0000 mg | DELAYED_RELEASE_TABLET | Freq: Every day | ORAL | Status: DC
  Administered 2013-12-18 – 2013-12-19 (×2): 40 mg via ORAL
  Filled 2013-12-18: qty 1

## 2013-12-18 MED ORDER — POLYETHYLENE GLYCOL 3350 17 G PO PACK
17.0000 g | PACK | Freq: Every day | ORAL | Status: DC | PRN
  Filled 2013-12-18: qty 1

## 2013-12-18 MED ORDER — PRAVASTATIN SODIUM 20 MG PO TABS
20.0000 mg | ORAL_TABLET | Freq: Every day | ORAL | Status: DC
  Administered 2013-12-18: 20 mg via ORAL
  Filled 2013-12-18 (×2): qty 1

## 2013-12-18 MED ORDER — ASPIRIN 81 MG PO CHEW
81.0000 mg | CHEWABLE_TABLET | Freq: Every day | ORAL | Status: DC
  Administered 2013-12-19: 81 mg via ORAL
  Filled 2013-12-18 (×2): qty 1

## 2013-12-18 MED ORDER — STROKE: EARLY STAGES OF RECOVERY BOOK
Freq: Once | Status: AC
  Administered 2013-12-19: 08:00:00
  Filled 2013-12-18: qty 1

## 2013-12-18 MED ORDER — PANTOPRAZOLE SODIUM 40 MG PO TBEC: 40.0000 mg | DELAYED_RELEASE_TABLET | Freq: Every day | ORAL | Status: DC

## 2013-12-18 MED ORDER — SIMVASTATIN 10 MG PO TABS
10.0000 mg | ORAL_TABLET | Freq: Every day | ORAL | Status: DC
Start: 2013-12-18 — End: 2013-12-18
  Filled 2013-12-18: qty 1

## 2013-12-18 NOTE — ED Notes (Signed)
MD at bedside. 

## 2013-12-18 NOTE — ED Notes (Signed)
Pt states that last night she was "seeing yellow spots" but denies any at this time

## 2013-12-18 NOTE — ED Notes (Signed)
Pt states that she feels "tightness" in her left arm and tingling in her left leg. Pt states that she has a headache that started today as well pt states that she has felt generalized weakness for "awhile".

## 2013-12-18 NOTE — ED Notes (Signed)
Pt brought back in wheelchair by Hollie Beach, RN and myself; pt undressed, in gown, on monitor, continuous pulse oximetry and blood pressure cuff

## 2013-12-18 NOTE — ED Notes (Signed)
She states "my left arm and shoulder has been hurting and going numb for three days. Yesterday i started to feel dizzy and sob. Then last night it felt like my heart was beating irregular." shes A&Ox4, resp e/u. Her doctor changed her blood thinner 2 weeks ago
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
## 2013-12-18 NOTE — ED Notes (Signed)
Pt transported to MRI 
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
## 2013-12-19 ENCOUNTER — Observation Stay (HOSPITAL_COMMUNITY): Payer: Medicare Other

## 2013-12-19 DIAGNOSIS — I10 Essential (primary) hypertension: Secondary | ICD-10-CM | POA: Diagnosis not present

## 2013-12-19 DIAGNOSIS — R079 Chest pain, unspecified: Secondary | ICD-10-CM

## 2013-12-19 DIAGNOSIS — F411 Generalized anxiety disorder: Secondary | ICD-10-CM | POA: Diagnosis not present

## 2013-12-19 DIAGNOSIS — R0789 Other chest pain: Principal | ICD-10-CM

## 2013-12-19 DIAGNOSIS — I2510 Atherosclerotic heart disease of native coronary artery without angina pectoris: Secondary | ICD-10-CM | POA: Diagnosis not present

## 2013-12-19 DIAGNOSIS — Z9861 Coronary angioplasty status: Secondary | ICD-10-CM

## 2013-12-19 DIAGNOSIS — R209 Unspecified disturbances of skin sensation: Secondary | ICD-10-CM | POA: Diagnosis not present

## 2013-12-19 DIAGNOSIS — R030 Elevated blood-pressure reading, without diagnosis of hypertension: Secondary | ICD-10-CM

## 2013-12-19 DIAGNOSIS — R072 Precordial pain: Secondary | ICD-10-CM

## 2013-12-19 LAB — LIPID PANEL
Cholesterol: 159 mg/dL (ref 0–200)
HDL: 28 mg/dL — ABNORMAL LOW (ref >39)
LDL Cholesterol: 103 mg/dL — ABNORMAL HIGH (ref 0–99)
Total CHOL/HDL Ratio: 5.7 RATIO
Triglycerides: 141 mg/dL (ref <150)
VLDL: 28 mg/dL (ref 0–40)

## 2013-12-19 LAB — BASIC METABOLIC PANEL
Anion gap: 12 (ref 5–15)
BUN: 16 mg/dL (ref 6–23)
CO2: 27 mEq/L (ref 19–32)
Calcium: 8.9 mg/dL (ref 8.4–10.5)
Chloride: 101 mEq/L (ref 96–112)
Creatinine, Ser: 0.75 mg/dL (ref 0.50–1.10)
GFR calc Af Amer: >90 mL/min (ref >90)
GFR calc non Af Amer: >90 mL/min (ref >90)
Glucose, Bld: 105 mg/dL — ABNORMAL HIGH (ref 70–99)
Potassium: 4.5 mEq/L (ref 3.7–5.3)
Sodium: 140 mEq/L (ref 137–147)

## 2013-12-19 LAB — HEMOGLOBIN A1C
Hgb A1c MFr Bld: 6 % — ABNORMAL HIGH (ref <5.7)
Mean Plasma Glucose: 126 mg/dL — ABNORMAL HIGH (ref <117)

## 2013-12-19 LAB — TROPONIN I
Troponin I: <0.3 ng/mL (ref <0.30)
Troponin I: <0.3 ng/mL (ref <0.30)

## 2013-12-19 LAB — GLUCOSE, CAPILLARY: Glucose-Capillary: 142 mg/dL — ABNORMAL HIGH (ref 70–99)

## 2013-12-19 MED ORDER — SUCRALFATE 1 GM/10ML PO SUSP: 1.0000 g | Freq: Three times a day (TID) | ORAL | Status: DC

## 2013-12-19 MED ORDER — TECHNETIUM TC 99M SESTAMIBI - CARDIOLITE
30.0000 | Freq: Once | INTRAVENOUS | Status: AC | PRN
  Administered 2013-12-19: 10:00:00 30 via INTRAVENOUS

## 2013-12-19 MED ORDER — CLONAZEPAM 1 MG PO TABS: 2.0000 mg | ORAL_TABLET | Freq: Three times a day (TID) | ORAL | Status: DC | PRN

## 2013-12-19 MED ORDER — SUCRALFATE 1 GM/10ML PO SUSP
1.0000 g | Freq: Three times a day (TID) | ORAL | Status: DC
  Administered 2013-12-19: 1 g via ORAL
  Filled 2013-12-19 (×4): qty 10

## 2013-12-19 MED ORDER — PANTOPRAZOLE SODIUM 40 MG PO TBEC: 40.0000 mg | DELAYED_RELEASE_TABLET | Freq: Two times a day (BID) | ORAL | Status: DC

## 2013-12-19 MED ORDER — LORAZEPAM 2 MG/ML IJ SOLN
2.0000 mg | Freq: Once | INTRAMUSCULAR | Status: AC
  Administered 2013-12-19: 2 mg via INTRAVENOUS
  Filled 2013-12-19: qty 1

## 2013-12-19 MED ORDER — REGADENOSON 0.4 MG/5ML IV SOLN
INTRAVENOUS | Status: AC
  Filled 2013-12-19: qty 5

## 2013-12-19 MED ORDER — TECHNETIUM TC 99M SESTAMIBI - CARDIOLITE
10.0000 | Freq: Once | INTRAVENOUS | Status: AC | PRN
  Administered 2013-12-19: 09:00:00 10 via INTRAVENOUS

## 2013-12-19 MED ORDER — REGADENOSON 0.4 MG/5ML IV SOLN
0.4000 mg | Freq: Once | INTRAVENOUS | Status: DC
  Filled 2013-12-19: qty 5

## 2013-12-19 NOTE — Progress Notes (Signed)
  Echocardiogram 2D Echocardiogram has been performed.  Mauricio Po 12/19/2013, 1:44 PM

## 2013-12-19 NOTE — Progress Notes (Signed)
Patient seen and examined and agree with note as outlined by Kerin Ransom PA-C.  Plan for lexiscan myoview today to rule out ischemia and 2D echo to assess LVF and diastolic function given SOB

## 2013-12-19 NOTE — Progress Notes (Signed)
Utilization Review Completed.Teagan Heidrick T7/19/2015  

## 2013-12-19 NOTE — Progress Notes (Signed)
    Subjective:  Pt is not sure she wants Myoview- she had "reaction" last time with tachycardia and anxiety.  Objective:  Vital Signs in the last 24 hours: Temp:  [97.5 F (36.4 C)-98.2 F (36.8 C)] 97.5 F (36.4 C) (07/19 0500) Pulse Rate:  [60-80] 64 (07/19 0500) Resp:  [14-25] 18 (07/19 0500) BP: (105-127)/(54-77) 105/54 mmHg (07/19 0500) SpO2:  [91 %-98 %] 91 % (07/19 0500) Weight:  [161 lb (73.029 kg)-162 lb 14.4 oz (73.891 kg)] 161 lb 1.6 oz (73.074 kg) (07/19 0500)  Intake/Output from previous day:  Intake/Output Summary (Last 24 hours) at 12/19/13 0742 Last data filed at 12/18/13 1757  Gross per 24 hour  Intake    240 ml  Output      0 ml  Net    240 ml    Physical Exam: General appearance: alert, cooperative and no distress Lungs: clear to auscultation bilaterally Heart: regular rate and rhythm   Rate: 66  Rhythm: normal sinus rhythm  Lab Results:  Recent Labs  12/18/13 1115 12/18/13 2005  WBC 5.0 6.8  HGB 13.3 14.4  PLT 164 180    Recent Labs  12/18/13 1115 12/18/13 2005 12/19/13 0650  NA 142  --  140  K 4.5  --  4.5  CL 105  --  101  CO2 26  --  27  GLUCOSE 190*  --  105*  BUN 14  --  16  CREATININE 0.61 0.80 0.75    Recent Labs  12/19/13 0058 12/19/13 0650  TROPONINI <0.30 <0.30   No results found for this basename: INR,  in the last 72 hours  Imaging: Imaging results have been reviewed  Cardiac Studies:  Assessment/Plan:  58 y.o. female with a history of CAD as described. She has had documented CAD after a negative Myoview in June 2014. Troponin have been negative x 3 despite having "chest pain all night".    Principal Problem:   Chest pain Active Problems:   CAD -S/P MI-PCI AVG 5/13 then staged DES to RCA  11/06/11. ISR- PCI 11/19/12, cath 02/18/13- no ISR, + spasm, Myoview low risk Nov 2014   Chest pain with moderate risk of acute coronary syndrome   ANXIETY   COPD   Dyslipidemia, goal LDL below 70   NICOTINE ADDICTION -  Tobacco Abuse, long standing   RESTLESS LEG SYNDROME   GERD   Left sided numbness    PLAN: After discussion with the pt she is agreeable to proceed with Lexiscan as long as she has something for her nerves pre procedure. Agree with echo as one of her complaints is chronic dyspnea.   Kerin Ransom PA-C [facility] [phone] 12/19/2013, 7:42 AM

## 2013-12-19 NOTE — Progress Notes (Signed)
PT Cancellation and Discharge Note  Patient Details Name: Michele [name] MRN: [id number] DOB: [birth year]   Cancelled Treatment:    Reason Eval/Treat Not Completed: PT screened, no needs identified, will sign off  Orders received, chart reviewed; Spoke with Mickel Baas, RN, who indicated that pt is independent with mobility, and has no PT needs;    Roney Marion [facility] 12/19/2013, 3:44 PM

## 2013-12-19 NOTE — Discharge Summary (Signed)
Physician Discharge Summary  Patient ID: Michele [name] MRN: [id number] DOB/AGE: [birth year] 60 y.o.  Admit date: 12/18/2013 Discharge date: 12/19/2013  Primary Care Physician:  [name], MD  Discharge Diagnoses:    . Chest pain with moderate risk of acute coronary syndrome: resolved, possibly GERD with esophagitis  . Left sided numbness likely TIA   . ANXIETY . COPD . Dyslipidemia, goal LDL below 70 . NICOTINE ADDICTION - Tobacco Abuse, long standing . RESTLESS LEG SYNDROME . GERD   Consults:  Cardiology    Recommendations for Outpatient Follow-up:    Allergies:   Allergies  Allergen Reactions  . Ibuprofen Other (See Comments)    GI upset  . Aspirin Other (See Comments)     GI upset (takes low dose aspirin at night) Pt can take enteric coated  . Ciprofloxacin     Low blood sugar, shakiness  . Crestor [Rosuvastatin]     Myalgias   . Lipitor [Atorvastatin]     Myalgias   . Sulfonamide Derivatives Itching and Rash  . Wellbutrin [Bupropion] Palpitations     Discharge Medications:   Medication List         aspirin 81 MG EC tablet  Take 81 mg by mouth at bedtime.     CALCIUM 600+D PO  Take 1 tablet by mouth 2 (two) times daily.     carvedilol 3.125 MG tablet  Commonly known as:  COREG  Take 1 tablet (3.125 mg total) by mouth 2 (two) times daily with a meal.     clonazePAM 2 MG tablet  Commonly known as:  KLONOPIN  Take 2 mg by mouth 3 (three) times daily as needed for anxiety.     clopidogrel 75 MG tablet  Commonly known as:  PLAVIX  Take 1 tablet (75 mg total) by mouth daily with breakfast.     CoQ10 100 MG Caps  Take 100 mg by mouth daily.     isosorbide mononitrate 30 MG 24 hr tablet  Commonly known as:  IMDUR  Take 30 mg by mouth at bedtime.     nitroGLYCERIN 0.4 MG SL tablet  Commonly known as:  NITROSTAT  Place 0.4 mg under the tongue every 5 (five) minutes as needed for chest pain.     omega-3 acid ethyl esters 1 G capsule   Commonly known as:  LOVAZA  Take 1 g by mouth 2 (two) times daily.     pantoprazole 40 MG tablet  Commonly known as:  PROTONIX  Take 1 tablet (40 mg total) by mouth 2 (two) times daily before a meal.     polyethylene glycol packet  Commonly known as:  MIRALAX / GLYCOLAX  Take 17 g by mouth daily as needed for moderate constipation.     pravastatin 20 MG tablet  Commonly known as:  PRAVACHOL  Take 20 mg by mouth at bedtime.     sucralfate 1 GM/10ML suspension  Commonly known as:  CARAFATE  Take 10 mLs (1 g total) by mouth 4 (four) times daily -  with meals and at bedtime. X 4 weeks     tiotropium 18 MCG inhalation capsule  Commonly known as:  SPIRIVA  Place 18 mcg into inhaler and inhale daily.         Brief H and P: For complete details please refer to admission H and P, but in brief Michele [name] is a 58 y.o. female  Past medical history of CAD, tobacco abuse and hypertension who had been  been noticing a left-sided paresthesias of her face and upper and lower extremity continuously for the past 3 days. Today when she was working out, she started experiencing some chest pressure associated with shortness of breath. Chest pressure was described as underneath her left breast nonradiating and mostly heavy as if there was a weight on it. She had some associated shortness of breath but no nausea. This felt similar to her previous time when she had a heart attack. She came into the emergency room for further evaluation. In the emergency room, CT scan of the head was unremarkable as was MRI. Initial cardiac markers and EKG were normal. Patient was felt to warrant further evaluation and hospitalist were called for further evaluation.   Hospital Course:  Chest pain witjh moderate risk:CAD -S/P MI-PCI AVG 5/13 then staged DES to RCA 11/06/11. ISR- PCI 11/19/12, cath 02/18/13- no ISR, + spasm, Myoview low risk Nov 2014  Cardiology was consulted and recommended nuclear medicine stress test. She  was ruled out for acute ACS with negative troponins. Nuclear medicine stress test showed focal infarct involving the inferolateral wall, no myocardial ischemia, mild inferior wall hypokinesis with normal left ventricular wall motion, EF 62% with no significant change since the prior examination from November 2014. I discussed in detail with Dr. Radford Pax and Dr. Dorris Carnes regarding the stress test imagings. Dr. Harrington Challenger concluded that patient's stress test is similar to the prior imaging in November 2014 and she has no new reversible ischemia hence cleared for discharge. 2-D echo showed EF of 65-70%, no regional wall motion abnormalities. patient also has hiatal hernia and significant GERD, placed on Protonix and Carafate for 4 weeks. Patient was strongly recommended to follow up with her PCP and may need outpatient GI referral if her symptoms are not improved with PPI.  Left sided numbness: Possible TIA,  MRI of the brain was negative for CVA  2-D echo showed EF of 65-70% with no regional wall motion abnormalities, carotid Dopplers showed 1-39% ICA stenosis. Continue aspirin, Plavix, statin.   ANXIETY  - Currently stable  NICOTINE ADDICTION - Tobacco Abuse, long standing  -Counseled on smoking cessation  COPD  - Continue Spiriva  GERD with hiatal hernia  - Placed on Protonix and Carafate, I recommended patient to see gastroenterology in 4 weeks, may need surgical correction of hiatal hernia    Day of Discharge BP 102/51  Pulse 71  Temp(Src) 97.4 F (36.3 C) (Oral)  Resp 20  Ht 5' 5.5" (1.664 m)  Wt 73.074 kg (161 lb 1.6 oz)  BMI 26.39 kg/m2  SpO2 95%  Physical Exam: General: Alert and awake oriented x3 not in any acute distress. HEENT: anicteric sclera, pupils reactive to light and accommodation CVS: S1-S2 clear no murmur rubs or gallops Chest: clear to auscultation bilaterally, no wheezing rales or rhonchi Abdomen: soft nontender, nondistended, normal bowel sounds Extremities: no  cyanosis, clubbing or edema noted bilaterally Neuro: Cranial nerves II-XII intact, no focal neurological deficits   The results of significant diagnostics from this hospitalization (including imaging, microbiology, ancillary and laboratory) are listed below for reference.    LAB RESULTS: Basic Metabolic Panel:  Recent Labs Lab 12/18/13 1115 12/18/13 2005 12/19/13 0650  NA 142  --  140  K 4.5  --  4.5  CL 105  --  101  CO2 26  --  27  GLUCOSE 190*  --  105*  BUN 14  --  16  CREATININE 0.61 0.80 0.75  CALCIUM 9.2  --  8.9   Liver Function Tests: No results found for this basename: AST, ALT, ALKPHOS, BILITOT, PROT, ALBUMIN,  in the last 168 hours No results found for this basename: LIPASE, AMYLASE,  in the last 168 hours No results found for this basename: AMMONIA,  in the last 168 hours CBC:  Recent Labs Lab 12/18/13 1115 12/18/13 2005  WBC 5.0 6.8  HGB 13.3 14.4  HCT 40.2 45.0  MCV 88.9 92.4  PLT 164 180   Cardiac Enzymes:  Recent Labs Lab 12/19/13 0058 12/19/13 0650  TROPONINI <0.30 <0.30   BNP: No components found with this basename: POCBNP,  CBG:  Recent Labs Lab 12/19/13 1126  GLUCAP 142*    Significant Diagnostic Studies:  Dg Chest 2 View  12/18/2013   CLINICAL DATA:  Shortness of breath. Dizziness. COPD. Prior myocardial infarction.  EXAM: CHEST  2 VIEW  COMPARISON:  08/10/2013  FINDINGS: Pulmonary emphysema again demonstrated with bullous disease most severe in the right upper lobe. No evidence of stop pulmonary infiltrate or edema. No evidence of pleural effusion. Heart size is within normal limits. No mass or lymphadenopathy identified.  IMPRESSION: Emphysema.  No active lung disease.   Electronically Signed   By: Earle Gell M.D.   On: 12/18/2013 11:48   Ct Head Wo Contrast  12/18/2013   CLINICAL DATA:  Dizziness.  Hypertension.  On anticoagulation.  EXAM: CT HEAD WITHOUT CONTRAST  TECHNIQUE: Contiguous axial images were obtained from the base of  the skull through the vertex without intravenous contrast.  COMPARISON:  None.  FINDINGS: No evidence of intracranial hemorrhage, brain edema, or other signs of acute infarction. No evidence of intracranial mass lesion or mass effect. No abnormal extraaxial fluid collections identified. Ventricles are normal in size. No skull abnormality identified.  IMPRESSION: Negative noncontrast head CT.   Electronically Signed   By: Earle Gell M.D.   On: 12/18/2013 12:03   Mr Brain Wo Contrast  12/18/2013   CLINICAL DATA:  Left arm and shoulder pain and numbness.  Dizziness.  EXAM: MRI HEAD WITHOUT CONTRAST  TECHNIQUE: Multiplanar, multiecho pulse sequences of the brain and surrounding structures were obtained without intravenous contrast.  COMPARISON:  Head CT same day.  MRI 08/29/2012  FINDINGS: The brain has a normal appearance on all pulse sequences without evidence of malformation, atrophy, old or acute infarction, mass lesion, hemorrhage, hydrocephalus or extra-axial collection. No pituitary mass. No fluid in the sinuses, middle ears or mastoids. No skull or skullbase lesion. There is flow in the major vessels at the base of the brain. Major venous sinuses show flow.  IMPRESSION: Study within normal limits for a person of this age. No cause of the presenting symptoms is identified.   Electronically Signed   By: Nelson Chimes M.D.   On: 12/18/2013 13:58   Nm Myocar Multi W/spect W/wall Motion / Ef  12/19/2013   CLINICAL DATA:  58 year old with coronary artery disease with prior inferior MI in May, 2013, treated with circumflex and right coronary artery stenting. Current history of hypertension and hyperlipidemia, presenting with chest pain.  EXAM: MYOCARDIAL IMAGING WITH SPECT (REST AND PHARMACOLOGIC-STRESS)  GATED LEFT VENTRICULAR WALL MOTION STUDY  LEFT VENTRICULAR EJECTION FRACTION  TECHNIQUE: Standard myocardial SPECT imaging was performed after resting intravenous injection of 10 mCi Tc-52m sestamibi.  Subsequently, intravenous infusion of Lexiscan was performed under the supervision of the Cardiology staff. At peak effect of the drug, 30 mCi Tc-63m sestamibi was injected intravenously and standard myocardial SPECT imaging was  performed. Quantitative gated imaging was also performed to evaluate left ventricular wall motion, and estimate left ventricular ejection fraction.  COMPARISON:  Nuclear medicine myocardial perfusion study 05/01/2013.  FINDINGS: Immediate post regadenoson images demonstrate focally diminished activity in the inferolateral wall relative to the remainder of the myocardium, unchanged from the prior examination. Initial resting images demonstrate similar findings. No evidence of reversibility to suggest ischemia. Findings confirmed by the computer generated polar map.  Gated images demonstrate mild inferior wall hypokinesis with normal left ventricular wall motion elsewhere.  Estimated QGS left ventricular ejection fraction measured 62%, with an end-diastolic volume of 61 ml and an end systolic volume of 23 ml. This compares favorably with the estimated QGS EF on the prior examination of 70%.  IMPRESSION: 1. Focal infarct involving the inferolateral wall. No evidence of myocardial ischemia. 2. Mild inferior wall hypokinesis with normal left ventricular wall motion elsewhere. 3. Estimated QGS ejection fraction 62%. 4. No significant change since the prior examination from November, 2014.   Electronically Signed   By: Evangeline Dakin M.D.   On: 12/19/2013 12:09    2D ECHO:  Study Conclusions  - Left ventricle: The cavity size was normal. Wall thickness was normal. Systolic function was vigorous. The estimated ejection fraction was in the range of 65% to 70%. Wall motion was normal; there were no regional wall motion abnormalities. There was an increased relative contribution of atrial contraction to ventricular filling.   Disposition and Follow-up:     Discharge Instructions    Diet - low sodium heart healthy    Complete by:  As directed      Discharge instructions    Complete by:  As directed   You can take Protonix with Plavix, increase to twice a day before meals. You can take Carafate with meals or before bedtime which will help with symptoms of acid reflux for 4 weeks. If your symptoms of hiatal hernia, acid reflux or not improving, please discuss with your primary care physician for GI referral.     Increase activity slowly    Complete by:  As directed             DISPOSITION: Home  DIET: Heart healthy diet    DISCHARGE FOLLOW-UP Follow-up Information   Follow up with [name], MD. Schedule an appointment as soon as possible for a visit in 10 days. (for hospital follow-up)    Specialty:  Family Medicine   Contact information:   [place] [address] [phone]       Follow up with Leonie Man, MD. Schedule an appointment as soon as possible for a visit in 2 weeks. (for hospital follow-up)    Specialty:  Cardiology   Contact information:   [address] [place] [address] [phone]       Time spent on Discharge: 40 mins  Signed:   Brazos Sandoval M.D. [facility] 12/19/2013, 3:33 PM Pager: [phone]   **Disclaimer: This note was dictated with voice recognition software. Similar sounding words can inadvertently be transcribed and this note may contain transcription errors which may not have been corrected upon publication of note.**

## 2013-12-19 NOTE — Progress Notes (Signed)
Patient ID: Michele [name]  female  NFA:[id number]    DOB: 01-05-1956    DOA: 12/18/2013  PCP: [name], MD  Assessment/Plan: Principal Problem:   Chest pain witjh moderate risk:CAD -S/P MI-PCI AVG 5/13 then staged DES to RCA  11/06/11. ISR- PCI 11/19/12, cath 02/18/13- no ISR, + spasm, Myoview low risk Nov 2014 -Cardiology consulted, stress test planned today , ruled out for acute disease, negative troponins - 2D echo to assess LVF and diastolic function given SOB and w/u of TIA - patient also has hiatal hernia and significant GERD, discussed with Dr. Radford Pax , it's okay to use the Protonix as patient is on Plavix    Active Problems:   Left sided numbness: Possible TIA - MRI of the brain negative for CVA - 2-D echo pending, carotid Dopplers pending - Continue aspirin, Plavix, statin - LDL 103, goal less than 100    ANXIETY - Currently stable    NICOTINE ADDICTION - Tobacco Abuse, long standing -Counseled on smoking cessation    COPD - Continue Spiriva    GERD with hiatal hernia - Placed on Protonix and Carafate, I recommended patient to see gastroenterology for symptoms of improved in 4 weeks, may need surgical correction of hiatal hernia  DVT Prophylaxis:  Code Status:  Family Communication: Discussed in detail with the patient  Disposition:  Consultants:  Cardiology  Procedures:  Nuclear medicine stress test  Antibiotics:  None    Subjective: Patient seen and examined today, has significant issues with GERD, no nausea vomiting or abdominal pain  Objective: Weight change:   Intake/Output Summary (Last 24 hours) at 12/19/13 1014 Last data filed at 12/19/13 0900  Gross per 24 hour  Intake    240 ml  Output      0 ml  Net    240 ml   Blood pressure 140/69, pulse 94, temperature 97.5 F (36.4 C), temperature source Oral, resp. rate 18, height 5' 5.5" (1.664 m), weight 73.074 kg (161 lb 1.6 oz), SpO2 91.00%.  Physical Exam: General: Alert and  awake, oriented x3, not in any acute distress. CVS: S1-S2 clear, no murmur rubs or gallops Chest: clear to auscultation bilaterally, no wheezing, rales or rhonchi Abdomen: soft nontender, nondistended, normal bowel sounds  Extremities: no cyanosis, clubbing or edema noted bilaterally Neuro: Cranial nerves II-XII intact, no focal neurological deficits  Lab Results: Basic Metabolic Panel:  Recent Labs Lab 12/18/13 1115 12/18/13 2005 12/19/13 0650  NA 142  --  140  K 4.5  --  4.5  CL 105  --  101  CO2 26  --  27  GLUCOSE 190*  --  105*  BUN 14  --  16  CREATININE 0.61 0.80 0.75  CALCIUM 9.2  --  8.9   Liver Function Tests: No results found for this basename: AST, ALT, ALKPHOS, BILITOT, PROT, ALBUMIN,  in the last 168 hours No results found for this basename: LIPASE, AMYLASE,  in the last 168 hours No results found for this basename: AMMONIA,  in the last 168 hours CBC:  Recent Labs Lab 12/18/13 1115 12/18/13 2005  WBC 5.0 6.8  HGB 13.3 14.4  HCT 40.2 45.0  MCV 88.9 92.4  PLT 164 180   Cardiac Enzymes:  Recent Labs Lab 12/18/13 2005 12/19/13 0058 12/19/13 0650  TROPONINI <0.30 <0.30 <0.30   BNP: No components found with this basename: POCBNP,  CBG: No results found for this basename: GLUCAP,  in the last 168 hours  Micro Results: No results found for this or any previous visit (from the past 240 hour(s)).  Studies/Results: Dg Chest 2 View  12/18/2013   CLINICAL DATA:  Shortness of breath. Dizziness. COPD. Prior myocardial infarction.  EXAM: CHEST  2 VIEW  COMPARISON:  08/10/2013  FINDINGS: Pulmonary emphysema again demonstrated with bullous disease most severe in the right upper lobe. No evidence of stop pulmonary infiltrate or edema. No evidence of pleural effusion. Heart size is within normal limits. No mass or lymphadenopathy identified.  IMPRESSION: Emphysema.  No active lung disease.   Electronically Signed   By: Earle Gell M.D.   On: 12/18/2013 11:48    Ct Head Wo Contrast  12/18/2013   CLINICAL DATA:  Dizziness.  Hypertension.  On anticoagulation.  EXAM: CT HEAD WITHOUT CONTRAST  TECHNIQUE: Contiguous axial images were obtained from the base of the skull through the vertex without intravenous contrast.  COMPARISON:  None.  FINDINGS: No evidence of intracranial hemorrhage, brain edema, or other signs of acute infarction. No evidence of intracranial mass lesion or mass effect. No abnormal extraaxial fluid collections identified. Ventricles are normal in size. No skull abnormality identified.  IMPRESSION: Negative noncontrast head CT.   Electronically Signed   By: Earle Gell M.D.   On: 12/18/2013 12:03   Mr Brain Wo Contrast  12/18/2013   CLINICAL DATA:  Left arm and shoulder pain and numbness.  Dizziness.  EXAM: MRI HEAD WITHOUT CONTRAST  TECHNIQUE: Multiplanar, multiecho pulse sequences of the brain and surrounding structures were obtained without intravenous contrast.  COMPARISON:  Head CT same day.  MRI 08/29/2012  FINDINGS: The brain has a normal appearance on all pulse sequences without evidence of malformation, atrophy, old or acute infarction, mass lesion, hemorrhage, hydrocephalus or extra-axial collection. No pituitary mass. No fluid in the sinuses, middle ears or mastoids. No skull or skullbase lesion. There is flow in the major vessels at the base of the brain. Major venous sinuses show flow.  IMPRESSION: Study within normal limits for a person of this age. No cause of the presenting symptoms is identified.   Electronically Signed   By: Nelson Chimes M.D.   On: 12/18/2013 13:58    Medications: Scheduled Meds: .  stroke: mapping our early stages of recovery book   Does not apply Once  . aspirin  81 mg Oral Daily  . clopidogrel  75 mg Oral Q breakfast  . enoxaparin (LOVENOX) injection  40 mg Subcutaneous Q24H  . isosorbide mononitrate  30 mg Oral QHS  . pantoprazole  40 mg Oral Daily  . pravastatin  20 mg Oral q1800  . regadenoson  0.4  mg Intravenous Once  . tiotropium  18 mcg Inhalation Daily      LOS: 1 day   RAI,RIPUDEEP M.D. [facility] 12/19/2013, 10:14 AM Pager: [phone]  If 7PM-7AM, please contact night-coverage [email or web address] Password [email or web address]  **Disclaimer: This note was dictated with voice recognition software. Similar sounding words can inadvertently be transcribed and this note may contain transcription errors which may not have been corrected upon publication of note.**

## 2013-12-19 NOTE — Progress Notes (Signed)
*  PRELIMINARY RESULTS* Vascular Ultrasound Carotid Duplex (Doppler) has been completed.  Findings suggest 1-39% internal carotid artery stenosis bilaterally. Vertebral arteries are patent with antegrade flow.  12/19/2013 1:51 PM Maudry Mayhew, RVT, RDCS, RDMS

## 2013-12-22 ENCOUNTER — Ambulatory Visit (HOSPITAL_COMMUNITY)
Admission: AD | Admit: 2013-12-22 | Discharge: 2013-12-22 | Disposition: A | Discharge status: Home / Self Care | Payer: Medicare Other | Source: Ambulatory Visit | Attending: Cardiology | Admitting: Cardiology

## 2013-12-22 DIAGNOSIS — Z79899 Other long term (current) drug therapy: Secondary | ICD-10-CM | POA: Diagnosis not present

## 2013-12-22 LAB — PLATELET INHIBITION P2Y12: Platelet Function  P2Y12: 202 [PRU] (ref 194–418)

## 2013-12-23 ENCOUNTER — Telehealth: Payer: Self-pay | Admitting: Cardiology

## 2013-12-23 NOTE — Telephone Encounter (Signed)
Michele [name] is calling because she is having a problem with one of the medications. The hospital gave her a prescription  Carasate 10mg  that she supposed to take 4 times a day , three meals and then at bedtime and her insurance will not pay for it and the Pharmacy states that she will need to have Dr. [name] send in the prescription and will it be denied but Dr. [name] has to override that..Please call .[name] The pharmacy is [facility] on [address] [phone]  Thanks

## 2013-12-24 NOTE — Telephone Encounter (Signed)
100% agree with this being a PCP issue  [facility]

## 2013-12-24 NOTE — Telephone Encounter (Signed)
Left message for patient . Reviewing discharge summary- patient to contact PCP for prior authorization of carafate.

## 2013-12-30 ENCOUNTER — Telehealth: Payer: Self-pay | Admitting: *Deleted

## 2013-12-30 ENCOUNTER — Telehealth: Payer: Self-pay | Admitting: Cardiology

## 2013-12-30 MED ORDER — PRASUGREL HCL 10 MG PO TABS: 10.0000 mg | ORAL_TABLET | Freq: Every day | ORAL | Status: DC

## 2013-12-30 NOTE — Telephone Encounter (Signed)
Message copied by Raiford Simmonds on Thu Dec 30, 2013  8:55 AM ------      Message from: [name], DAVID W      Created: Fri Dec 24, 2013 12:05 AM       It would appear that she is not a good Plavix responder.  I would like to try having her take 150mg  daily & recheck levels in 2 weeks.  The level is close & double dose may have the desired effect.            Leonie Man, MD       ------

## 2013-12-30 NOTE — Telephone Encounter (Signed)
PATIENT HAS AQUESTION IF SHE SHE SHOULD TAKE THE EFFIENT TODAY.SHE STATES SHE PICKED UP THE MEDICATION RN ASKED IF SHE TOOK HER clopidogrel today . Patient states she did. RN informed patient to start tomorrow. SHE VERBALIZED UNDERSTANDING. PATIENT ALSO ASKED IF SHE DRINK SOME WINE TO HELP THIN HER BLOOD. RN INFORMED PATIENT IF SHE HAS NOT BEEN DRINKING WINE IN THE PAST DO NOT START NOW. SHE STATES SHE HAS NOT. VERBALIZED UNDERSTANDING  SHE STATES SHE IS TAKING 2 PANTOPRAZOLE AT NIGHT - MAY NEED prior auth. -RN informed patient she will need to get PA FROM PCP OR GI doctor.

## 2013-12-30 NOTE — Telephone Encounter (Signed)
Spoke to patient. Informed her of the results.  She states what if insurance will not pay for it.  RN informed patient will have to do a prior authorizatio when she complete the 2nd P2Y12 lab. If she is not a non -responder on 2 clopidogrel will have to return to EFFIENT. Patient asked if she can return to EFFIENT - "Bland". RN informed patient- can switch now to EFFIENT.  NEW PRESCRIPTION WILL BE e-sent for EFFIENT and Clopidogel will be discontinue. She verbalized understanding. She is aware of her appointment with extender next week  Patient wanted to know if she still need to have lab in 2 weeks. RN informed her no,since she was not increasing Clopidogrel to 150 mg.

## 2013-12-30 NOTE — Telephone Encounter (Signed)
Effient w/o ASA.  Leonie Man, MD

## 2013-12-30 NOTE — Telephone Encounter (Addendum)
NOTIFIED PATIENT TO STOP ASPIRIN WHEN SHE RESTART EFFIENT SHE VERBALIZED UNDERSTANDING.

## 2013-12-30 NOTE — Addendum Note (Signed)
Addended by: Raiford Simmonds on: 12/30/2013 12:21 PM   Modules accepted: Orders, Medications

## 2013-12-30 NOTE — Telephone Encounter (Signed)
Michele [name] called with another question for [facility]. Please call her back at [phone]..  Thanks

## 2013-12-31 DIAGNOSIS — F411 Generalized anxiety disorder: Secondary | ICD-10-CM | POA: Diagnosis not present

## 2013-12-31 DIAGNOSIS — E785 Hyperlipidemia, unspecified: Secondary | ICD-10-CM | POA: Diagnosis not present

## 2013-12-31 DIAGNOSIS — R079 Chest pain, unspecified: Secondary | ICD-10-CM | POA: Diagnosis not present

## 2013-12-31 DIAGNOSIS — I2510 Atherosclerotic heart disease of native coronary artery without angina pectoris: Secondary | ICD-10-CM | POA: Diagnosis not present

## 2013-12-31 DIAGNOSIS — I10 Essential (primary) hypertension: Secondary | ICD-10-CM | POA: Diagnosis not present

## 2013-12-31 DIAGNOSIS — K219 Gastro-esophageal reflux disease without esophagitis: Secondary | ICD-10-CM | POA: Diagnosis not present

## 2014-01-05 ENCOUNTER — Encounter: Payer: Self-pay | Admitting: Cardiology

## 2014-01-05 ENCOUNTER — Ambulatory Visit (INDEPENDENT_AMBULATORY_CARE_PROVIDER_SITE_OTHER): Payer: Medicare Other | Admitting: Cardiology

## 2014-01-05 VITALS — BP 130/72 | HR 78 | Ht 65.5 in | Wt 163.0 lb

## 2014-01-05 DIAGNOSIS — I2510 Atherosclerotic heart disease of native coronary artery without angina pectoris: Secondary | ICD-10-CM | POA: Diagnosis not present

## 2014-01-05 DIAGNOSIS — I209 Angina pectoris, unspecified: Secondary | ICD-10-CM

## 2014-01-05 MED ORDER — ISOSORBIDE MONONITRATE ER 30 MG PO TB24: 30.0000 mg | ORAL_TABLET | Freq: Every day | ORAL | Status: DC

## 2014-01-05 NOTE — Patient Instructions (Signed)
Your physician recommends that you schedule a follow-up appointment in: AS SCHEDULED WITH DR HARDING  CONTINUE TO WORK ON SMOKING  EXERCISE 30 MINUTES DAILY
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
## 2014-01-19 DIAGNOSIS — N390 Urinary tract infection, site not specified: Secondary | ICD-10-CM | POA: Diagnosis not present

## 2014-02-21 DIAGNOSIS — N390 Urinary tract infection, site not specified: Secondary | ICD-10-CM | POA: Diagnosis not present

## 2014-02-21 DIAGNOSIS — R7309 Other abnormal glucose: Secondary | ICD-10-CM | POA: Diagnosis not present

## 2014-03-01 ENCOUNTER — Emergency Department (HOSPITAL_COMMUNITY): Payer: Medicare Other

## 2014-03-01 ENCOUNTER — Encounter (HOSPITAL_COMMUNITY): Payer: Self-pay | Admitting: Cardiology

## 2014-03-01 ENCOUNTER — Inpatient Hospital Stay (HOSPITAL_COMMUNITY)
Admission: EM | Admit: 2014-03-01 | Discharge: 2014-03-04 | DRG: 287 | Disposition: A | Discharge status: Home / Self Care | Payer: Medicare Other | Attending: Cardiology | Admitting: Cardiology

## 2014-03-01 DIAGNOSIS — G2581 Restless legs syndrome: Secondary | ICD-10-CM | POA: Diagnosis present

## 2014-03-01 DIAGNOSIS — I25110 Atherosclerotic heart disease of native coronary artery with unstable angina pectoris: Secondary | ICD-10-CM | POA: Diagnosis present

## 2014-03-01 DIAGNOSIS — Z800 Family history of malignant neoplasm of digestive organs: Secondary | ICD-10-CM | POA: Diagnosis not present

## 2014-03-01 DIAGNOSIS — F419 Anxiety disorder, unspecified: Secondary | ICD-10-CM | POA: Diagnosis present

## 2014-03-01 DIAGNOSIS — I200 Unstable angina: Secondary | ICD-10-CM | POA: Diagnosis present

## 2014-03-01 DIAGNOSIS — I129 Hypertensive chronic kidney disease with stage 1 through stage 4 chronic kidney disease, or unspecified chronic kidney disease: Secondary | ICD-10-CM | POA: Diagnosis present

## 2014-03-01 DIAGNOSIS — Z825 Family history of asthma and other chronic lower respiratory diseases: Secondary | ICD-10-CM

## 2014-03-01 DIAGNOSIS — J438 Other emphysema: Secondary | ICD-10-CM | POA: Diagnosis not present

## 2014-03-01 DIAGNOSIS — I25119 Atherosclerotic heart disease of native coronary artery with unspecified angina pectoris: Secondary | ICD-10-CM

## 2014-03-01 DIAGNOSIS — M25569 Pain in unspecified knee: Secondary | ICD-10-CM | POA: Diagnosis present

## 2014-03-01 DIAGNOSIS — T82857A Stenosis of cardiac prosthetic devices, implants and grafts, initial encounter: Secondary | ICD-10-CM | POA: Diagnosis present

## 2014-03-01 DIAGNOSIS — Z833 Family history of diabetes mellitus: Secondary | ICD-10-CM | POA: Diagnosis not present

## 2014-03-01 DIAGNOSIS — Z886 Allergy status to analgesic agent status: Secondary | ICD-10-CM

## 2014-03-01 DIAGNOSIS — Z823 Family history of stroke: Secondary | ICD-10-CM

## 2014-03-01 DIAGNOSIS — I2510 Atherosclerotic heart disease of native coronary artery without angina pectoris: Secondary | ICD-10-CM

## 2014-03-01 DIAGNOSIS — Z8249 Family history of ischemic heart disease and other diseases of the circulatory system: Secondary | ICD-10-CM | POA: Diagnosis not present

## 2014-03-01 DIAGNOSIS — Z720 Tobacco use: Secondary | ICD-10-CM | POA: Diagnosis present

## 2014-03-01 DIAGNOSIS — Z882 Allergy status to sulfonamides status: Secondary | ICD-10-CM | POA: Diagnosis not present

## 2014-03-01 DIAGNOSIS — R569 Unspecified convulsions: Secondary | ICD-10-CM

## 2014-03-01 DIAGNOSIS — Z9861 Coronary angioplasty status: Secondary | ICD-10-CM | POA: Diagnosis not present

## 2014-03-01 DIAGNOSIS — G8929 Other chronic pain: Secondary | ICD-10-CM | POA: Diagnosis present

## 2014-03-01 DIAGNOSIS — J449 Chronic obstructive pulmonary disease, unspecified: Secondary | ICD-10-CM | POA: Diagnosis not present

## 2014-03-01 DIAGNOSIS — K219 Gastro-esophageal reflux disease without esophagitis: Secondary | ICD-10-CM | POA: Diagnosis present

## 2014-03-01 DIAGNOSIS — Z889 Allergy status to unspecified drugs, medicaments and biological substances status: Secondary | ICD-10-CM | POA: Diagnosis not present

## 2014-03-01 DIAGNOSIS — G40909 Epilepsy, unspecified, not intractable, without status epilepticus: Secondary | ICD-10-CM | POA: Diagnosis present

## 2014-03-01 DIAGNOSIS — N189 Chronic kidney disease, unspecified: Secondary | ICD-10-CM | POA: Diagnosis not present

## 2014-03-01 DIAGNOSIS — R001 Bradycardia, unspecified: Secondary | ICD-10-CM | POA: Diagnosis not present

## 2014-03-01 DIAGNOSIS — F17210 Nicotine dependence, cigarettes, uncomplicated: Secondary | ICD-10-CM | POA: Diagnosis present

## 2014-03-01 DIAGNOSIS — I10 Essential (primary) hypertension: Secondary | ICD-10-CM | POA: Diagnosis not present

## 2014-03-01 DIAGNOSIS — Y712 Prosthetic and other implants, materials and accessory cardiovascular devices associated with adverse incidents: Secondary | ICD-10-CM | POA: Diagnosis present

## 2014-03-01 DIAGNOSIS — G4700 Insomnia, unspecified: Secondary | ICD-10-CM | POA: Diagnosis present

## 2014-03-01 DIAGNOSIS — T82897A Other specified complication of cardiac prosthetic devices, implants and grafts, initial encounter: Secondary | ICD-10-CM | POA: Diagnosis not present

## 2014-03-01 DIAGNOSIS — E785 Hyperlipidemia, unspecified: Secondary | ICD-10-CM | POA: Diagnosis not present

## 2014-03-01 DIAGNOSIS — R079 Chest pain, unspecified: Secondary | ICD-10-CM | POA: Diagnosis not present

## 2014-03-01 DIAGNOSIS — I252 Old myocardial infarction: Secondary | ICD-10-CM | POA: Diagnosis not present

## 2014-03-01 DIAGNOSIS — J4489 Other specified chronic obstructive pulmonary disease: Secondary | ICD-10-CM | POA: Diagnosis not present

## 2014-03-01 DIAGNOSIS — I201 Angina pectoris with documented spasm: Secondary | ICD-10-CM | POA: Diagnosis present

## 2014-03-01 LAB — BASIC METABOLIC PANEL
Anion gap: 8 (ref 5–15)
BUN: 10 mg/dL (ref 6–23)
CO2: 28 mEq/L (ref 19–32)
Calcium: 9.6 mg/dL (ref 8.4–10.5)
Chloride: 105 mEq/L (ref 96–112)
Creatinine, Ser: 0.61 mg/dL (ref 0.50–1.10)
GFR calc Af Amer: >90 mL/min (ref >90)
GFR calc non Af Amer: >90 mL/min (ref >90)
Glucose, Bld: 81 mg/dL (ref 70–99)
Potassium: 4.6 mEq/L (ref 3.7–5.3)
Sodium: 141 mEq/L (ref 137–147)

## 2014-03-01 LAB — CK TOTAL AND CKMB (NOT AT ARMC)
CK, MB: <1 ng/mL (ref 0.3–4.0)
Total CK: 20 U/L (ref 7–177)

## 2014-03-01 LAB — CBC
HCT: 41 % (ref 36.0–46.0)
Hemoglobin: 13.6 g/dL (ref 12.0–15.0)
MCH: 29.8 pg (ref 26.0–34.0)
MCHC: 33.2 g/dL (ref 30.0–36.0)
MCV: 89.9 fL (ref 78.0–100.0)
Platelets: 177 10*3/uL (ref 150–400)
RBC: 4.56 MIL/uL (ref 3.87–5.11)
RDW: 13.1 % (ref 11.5–15.5)
WBC: 4.9 10*3/uL (ref 4.0–10.5)

## 2014-03-01 LAB — HEPATIC FUNCTION PANEL
ALT: 10 U/L (ref 0–35)
AST: 15 U/L (ref 0–37)
Albumin: 3.2 g/dL — ABNORMAL LOW (ref 3.5–5.2)
Alkaline Phosphatase: 61 U/L (ref 39–117)
Bilirubin, Direct: <0.2 mg/dL (ref 0.0–0.3)
Total Bilirubin: 0.3 mg/dL (ref 0.3–1.2)
Total Protein: 6.3 g/dL (ref 6.0–8.3)

## 2014-03-01 LAB — MAGNESIUM: Magnesium: 2 mg/dL (ref 1.5–2.5)

## 2014-03-01 LAB — PROTIME-INR
INR: 1.21 (ref 0.00–1.49)
Prothrombin Time: 15.3 seconds — ABNORMAL HIGH (ref 11.6–15.2)

## 2014-03-01 LAB — TROPONIN I
Troponin I: <0.3 ng/mL (ref <0.30)
Troponin I: <0.3 ng/mL (ref <0.30)

## 2014-03-01 LAB — TSH: TSH: 3.27 u[IU]/mL (ref 0.350–4.500)

## 2014-03-01 LAB — SEDIMENTATION RATE: Sed Rate: 13 mm/hr (ref 0–22)

## 2014-03-01 LAB — MRSA PCR SCREENING: MRSA by PCR: NEGATIVE

## 2014-03-01 MED ORDER — ONDANSETRON HCL 4 MG/2ML IJ SOLN
4.0000 mg | Freq: Four times a day (QID) | INTRAMUSCULAR | Status: DC | PRN
Start: 2014-03-01 — End: 2014-03-04
  Administered 2014-03-01: 4 mg via INTRAVENOUS
  Filled 2014-03-01: qty 2

## 2014-03-01 MED ORDER — SODIUM CHLORIDE 0.9 % IV SOLN: INTRAVENOUS | Status: DC

## 2014-03-01 MED ORDER — ASPIRIN 81 MG PO CHEW
324.0000 mg | CHEWABLE_TABLET | ORAL | Status: DC
  Filled 2014-03-01: qty 4

## 2014-03-01 MED ORDER — CARVEDILOL 3.125 MG PO TABS
3.1250 mg | ORAL_TABLET | Freq: Two times a day (BID) | ORAL | Status: DC
  Administered 2014-03-02 – 2014-03-04 (×3): 3.125 mg via ORAL
  Filled 2014-03-01 (×8): qty 1

## 2014-03-01 MED ORDER — ONDANSETRON HCL 4 MG/2ML IJ SOLN
4.0000 mg | Freq: Once | INTRAMUSCULAR | Status: AC
  Administered 2014-03-01: 4 mg via INTRAVENOUS
  Filled 2014-03-01: qty 2

## 2014-03-01 MED ORDER — CLONAZEPAM 1 MG PO TABS
2.0000 mg | ORAL_TABLET | Freq: Three times a day (TID) | ORAL | Status: DC | PRN
Start: 2014-03-01 — End: 2014-03-04
  Administered 2014-03-01 – 2014-03-03 (×3): 2 mg via ORAL
  Filled 2014-03-01 (×3): qty 2

## 2014-03-01 MED ORDER — NITROGLYCERIN 0.4 MG SL SUBL: 0.4000 mg | SUBLINGUAL_TABLET | SUBLINGUAL | Status: DC | PRN

## 2014-03-01 MED ORDER — TIOTROPIUM BROMIDE MONOHYDRATE 18 MCG IN CAPS
18.0000 ug | ORAL_CAPSULE | Freq: Every day | RESPIRATORY_TRACT | Status: DC
  Administered 2014-03-02 – 2014-03-03 (×2): 18 ug via RESPIRATORY_TRACT
  Filled 2014-03-01: qty 5

## 2014-03-01 MED ORDER — CALCIUM CARBONATE-VITAMIN D 600-200 MG-UNIT PO TABS
1.0000 | ORAL_TABLET | Freq: Every morning | ORAL | Status: DC
  Filled 2014-03-01: qty 1

## 2014-03-01 MED ORDER — HEPARIN BOLUS VIA INFUSION
4000.0000 [IU] | Freq: Once | INTRAVENOUS | Status: AC
  Administered 2014-03-01: 4000 [IU] via INTRAVENOUS
  Filled 2014-03-01: qty 4000

## 2014-03-01 MED ORDER — SODIUM CHLORIDE 0.9 % IV SOLN
INTRAVENOUS | Status: DC
  Administered 2014-03-02: 04:00:00 via INTRAVENOUS

## 2014-03-01 MED ORDER — OMEGA-3-ACID ETHYL ESTERS 1 G PO CAPS
1.0000 g | ORAL_CAPSULE | Freq: Two times a day (BID) | ORAL | Status: DC
  Administered 2014-03-02 – 2014-03-04 (×5): 1 g via ORAL
  Filled 2014-03-01 (×8): qty 1

## 2014-03-01 MED ORDER — SODIUM CHLORIDE 0.9 % IJ SOLN
3.0000 mL | Freq: Two times a day (BID) | INTRAMUSCULAR | Status: DC
  Administered 2014-03-01 – 2014-03-02 (×2): 3 mL via INTRAVENOUS

## 2014-03-01 MED ORDER — MORPHINE SULFATE 4 MG/ML IJ SOLN
4.0000 mg | Freq: Once | INTRAMUSCULAR | Status: AC
  Administered 2014-03-01: 4 mg via INTRAVENOUS
  Filled 2014-03-01: qty 1

## 2014-03-01 MED ORDER — NITROGLYCERIN IN D5W 200-5 MCG/ML-% IV SOLN
5.0000 ug/min | INTRAVENOUS | Status: DC
  Administered 2014-03-01: 5 ug/min via INTRAVENOUS

## 2014-03-01 MED ORDER — SODIUM CHLORIDE 0.9 % IV SOLN
Freq: Once | INTRAVENOUS | Status: AC
  Administered 2014-03-01: 16:00:00 via INTRAVENOUS

## 2014-03-01 MED ORDER — ASPIRIN 300 MG RE SUPP
300.0000 mg | RECTAL | Status: DC
  Filled 2014-03-01: qty 1

## 2014-03-01 MED ORDER — COQ10 100 MG PO CAPS: 100.0000 mg | ORAL_CAPSULE | Freq: Every day | ORAL | Status: DC

## 2014-03-01 MED ORDER — MORPHINE SULFATE 2 MG/ML IJ SOLN
2.0000 mg | INTRAMUSCULAR | Status: DC | PRN
  Administered 2014-03-01 – 2014-03-02 (×4): 2 mg via INTRAVENOUS
  Filled 2014-03-01 (×3): qty 1

## 2014-03-01 MED ORDER — PRASUGREL HCL 10 MG PO TABS
10.0000 mg | ORAL_TABLET | Freq: Every day | ORAL | Status: DC
  Administered 2014-03-02 – 2014-03-04 (×3): 10 mg via ORAL
  Filled 2014-03-01 (×4): qty 1

## 2014-03-01 MED ORDER — NITROGLYCERIN IN D5W 200-5 MCG/ML-% IV SOLN
0.0000 ug/min | Freq: Once | INTRAVENOUS | Status: AC
  Administered 2014-03-01: 5 ug/min via INTRAVENOUS
  Filled 2014-03-01: qty 250

## 2014-03-01 MED ORDER — INFLUENZA VAC SPLIT QUAD 0.5 ML IM SUSY
0.5000 mL | PREFILLED_SYRINGE | INTRAMUSCULAR | Status: AC
  Administered 2014-03-03: 0.5 mL via INTRAMUSCULAR
  Filled 2014-03-01 (×2): qty 0.5

## 2014-03-01 MED ORDER — ASPIRIN EC 81 MG PO TBEC
81.0000 mg | DELAYED_RELEASE_TABLET | Freq: Every day | ORAL | Status: DC
  Administered 2014-03-02 – 2014-03-04 (×3): 81 mg via ORAL
  Filled 2014-03-01 (×4): qty 1

## 2014-03-01 MED ORDER — SODIUM CHLORIDE 0.9 % IV SOLN: 250.0000 mL | INTRAVENOUS | Status: DC | PRN

## 2014-03-01 MED ORDER — ZOLPIDEM TARTRATE 5 MG PO TABS: 5.0000 mg | ORAL_TABLET | Freq: Every evening | ORAL | Status: DC | PRN

## 2014-03-01 MED ORDER — ACETAMINOPHEN 325 MG PO TABS: 650.0000 mg | ORAL_TABLET | ORAL | Status: DC | PRN

## 2014-03-01 MED ORDER — SODIUM CHLORIDE 0.9 % IJ SOLN: 3.0000 mL | INTRAMUSCULAR | Status: DC | PRN

## 2014-03-01 MED ORDER — HEPARIN (PORCINE) IN NACL 100-0.45 UNIT/ML-% IJ SOLN
1000.0000 [IU]/h | INTRAMUSCULAR | Status: DC
  Administered 2014-03-01: 850 [IU]/h via INTRAVENOUS
  Filled 2014-03-01 (×2): qty 250

## 2014-03-01 MED ORDER — PANTOPRAZOLE SODIUM 40 MG PO TBEC
40.0000 mg | DELAYED_RELEASE_TABLET | Freq: Two times a day (BID) | ORAL | Status: DC
  Administered 2014-03-01 – 2014-03-04 (×7): 40 mg via ORAL
  Filled 2014-03-01 (×6): qty 1
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
## 2014-03-01 NOTE — ED Notes (Signed)
NTG gtt started at 36mcg/min.  Pt rates chest pain #3 on pain scale 0/10

## 2014-03-01 NOTE — ED Notes (Signed)
Pt ambulated to the bathroom.  
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
## 2014-03-01 NOTE — ED Notes (Signed)
Per EMS: chest pain started yesterday. Pt took "nerve pill" decreased pain. This morning she woke up, still hurting. EMS called, given Nitro x2 and 324 ASA.

## 2014-03-01 NOTE — ED Notes (Signed)
Cecilie Kicks NP in to assess pt at this time.

## 2014-03-01 NOTE — ED Notes (Signed)
Pt given ginger ale and meal tray ordered.

## 2014-03-01 NOTE — Progress Notes (Signed)
Pt arrived to 2H26 via stretcher from the ED accompanied by the RN, on cardiac monitor, nitro drip infusing. Pt alert and oriented, no apparent discomfort or distress observed. Pt denies discomfort, distress, or chest pain at this time. Pt able to stand and walk to hospital bed. ICU new admit protocol followed. CCMD and E- Link notified of patient's arrival. Pt oriented to the bed/room/unit. Bed low and locked, side rails up x2, call light within reach. Will continue to monitor. Roselyn Reef Mallie Linnemann,RN

## 2014-03-01 NOTE — ED Notes (Signed)
Carb mod Meal tray ordered

## 2014-03-01 NOTE — ED Notes (Signed)
Pt st's chest pain remains #3 on pain scale 0/10.  Skin warm and dry, color appropriate
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
## 2014-03-02 ENCOUNTER — Encounter (HOSPITAL_COMMUNITY)
Admission: EM | Disposition: A | Discharge status: Home / Self Care | Payer: Self-pay | Source: Home / Self Care | Attending: Cardiology

## 2014-03-02 DIAGNOSIS — I2510 Atherosclerotic heart disease of native coronary artery without angina pectoris: Secondary | ICD-10-CM

## 2014-03-02 DIAGNOSIS — E785 Hyperlipidemia, unspecified: Secondary | ICD-10-CM

## 2014-03-02 HISTORY — PX: CARDIAC CATHETERIZATION: SHX172

## 2014-03-02 HISTORY — PX: LEFT HEART CATHETERIZATION WITH CORONARY ANGIOGRAM: SHX5451

## 2014-03-02 LAB — CREATININE, SERUM
Creatinine, Ser: 0.67 mg/dL (ref 0.50–1.10)
GFR calc Af Amer: >90 mL/min (ref >90)
GFR calc non Af Amer: >90 mL/min (ref >90)

## 2014-03-02 LAB — LIPID PANEL
Cholesterol: 136 mg/dL (ref 0–200)
HDL: 27 mg/dL — ABNORMAL LOW (ref >39)
LDL Cholesterol: 87 mg/dL (ref 0–99)
Total CHOL/HDL Ratio: 5 RATIO
Triglycerides: 111 mg/dL (ref <150)
VLDL: 22 mg/dL (ref 0–40)

## 2014-03-02 LAB — T4, FREE: Free T4: 1.42 ng/dL (ref 0.80–1.80)

## 2014-03-02 LAB — BASIC METABOLIC PANEL
Anion gap: 9 (ref 5–15)
BUN: 14 mg/dL (ref 6–23)
CO2: 26 mEq/L (ref 19–32)
Calcium: 8.6 mg/dL (ref 8.4–10.5)
Chloride: 106 mEq/L (ref 96–112)
Creatinine, Ser: 0.67 mg/dL (ref 0.50–1.10)
GFR calc Af Amer: >90 mL/min (ref >90)
GFR calc non Af Amer: >90 mL/min (ref >90)
Glucose, Bld: 94 mg/dL (ref 70–99)
Potassium: 4.1 mEq/L (ref 3.7–5.3)
Sodium: 141 mEq/L (ref 137–147)

## 2014-03-02 LAB — TROPONIN I
Troponin I: <0.3 ng/mL (ref <0.30)
Troponin I: <0.3 ng/mL (ref <0.30)

## 2014-03-02 LAB — CBC
HCT: 39.2 % (ref 36.0–46.0)
HCT: 38.4 % (ref 36.0–46.0)
Hemoglobin: 13 g/dL (ref 12.0–15.0)
Hemoglobin: 12.8 g/dL (ref 12.0–15.0)
MCH: 30.2 pg (ref 26.0–34.0)
MCH: 29.6 pg (ref 26.0–34.0)
MCHC: 33.2 g/dL (ref 30.0–36.0)
MCHC: 33.3 g/dL (ref 30.0–36.0)
MCV: 91 fL (ref 78.0–100.0)
MCV: 88.9 fL (ref 78.0–100.0)
Platelets: 149 10*3/uL — ABNORMAL LOW (ref 150–400)
Platelets: 144 10*3/uL — ABNORMAL LOW (ref 150–400)
RBC: 4.31 MIL/uL (ref 3.87–5.11)
RBC: 4.32 MIL/uL (ref 3.87–5.11)
RDW: 13.1 % (ref 11.5–15.5)
RDW: 13 % (ref 11.5–15.5)
WBC: 4.6 10*3/uL (ref 4.0–10.5)
WBC: 4 10*3/uL (ref 4.0–10.5)

## 2014-03-02 LAB — HEPARIN LEVEL (UNFRACTIONATED)
Heparin Unfractionated: 0.18 IU/mL — ABNORMAL LOW (ref 0.30–0.70)
Heparin Unfractionated: 0.29 IU/mL — ABNORMAL LOW (ref 0.30–0.70)

## 2014-03-02 LAB — POCT ACTIVATED CLOTTING TIME: Activated Clotting Time: 90 seconds

## 2014-03-02 LAB — GLUCOSE, CAPILLARY: Glucose-Capillary: 81 mg/dL (ref 70–99)

## 2014-03-02 SURGERY — LEFT HEART CATHETERIZATION WITH CORONARY ANGIOGRAM
Anesthesia: LOCAL

## 2014-03-02 MED ORDER — FENTANYL CITRATE 0.05 MG/ML IJ SOLN
INTRAMUSCULAR | Status: AC
  Filled 2014-03-02: qty 2

## 2014-03-02 MED ORDER — HEPARIN (PORCINE) IN NACL 2-0.9 UNIT/ML-% IJ SOLN
INTRAMUSCULAR | Status: AC
  Filled 2014-03-02: qty 500

## 2014-03-02 MED ORDER — RANOLAZINE ER 500 MG PO TB12
500.0000 mg | ORAL_TABLET | Freq: Two times a day (BID) | ORAL | Status: DC
  Administered 2014-03-02 – 2014-03-04 (×4): 500 mg via ORAL
  Filled 2014-03-02 (×6): qty 1

## 2014-03-02 MED ORDER — ISOSORBIDE MONONITRATE ER 30 MG PO TB24
30.0000 mg | ORAL_TABLET | Freq: Every day | ORAL | Status: DC
  Administered 2014-03-02 – 2014-03-04 (×3): 30 mg via ORAL
  Filled 2014-03-02 (×3): qty 1

## 2014-03-02 MED ORDER — CALCIUM CARBONATE-VITAMIN D 500-200 MG-UNIT PO TABS
1.0000 | ORAL_TABLET | Freq: Every day | ORAL | Status: DC
  Administered 2014-03-02 – 2014-03-04 (×3): 1 via ORAL
  Filled 2014-03-02 (×5): qty 1

## 2014-03-02 MED ORDER — HEPARIN BOLUS VIA INFUSION
2000.0000 [IU] | Freq: Once | INTRAVENOUS | Status: AC
  Administered 2014-03-02: 2000 [IU] via INTRAVENOUS
  Filled 2014-03-02: qty 2000

## 2014-03-02 MED ORDER — HEPARIN (PORCINE) IN NACL 2-0.9 UNIT/ML-% IJ SOLN
INTRAMUSCULAR | Status: AC
  Filled 2014-03-02: qty 1000

## 2014-03-02 MED ORDER — SODIUM CHLORIDE 0.9 % IV SOLN
1.0000 mL/kg/h | INTRAVENOUS | Status: AC
  Administered 2014-03-02: 1 mL/kg/h via INTRAVENOUS

## 2014-03-02 MED ORDER — HEPARIN SODIUM (PORCINE) 5000 UNIT/ML IJ SOLN
5000.0000 [IU] | Freq: Three times a day (TID) | INTRAMUSCULAR | Status: DC
  Administered 2014-03-03 – 2014-03-04 (×4): 5000 [IU] via SUBCUTANEOUS
  Filled 2014-03-02 (×8): qty 1

## 2014-03-02 MED ORDER — PRAVASTATIN SODIUM 40 MG PO TABS
80.0000 mg | ORAL_TABLET | Freq: Every day | ORAL | Status: DC
  Administered 2014-03-02 – 2014-03-03 (×2): 80 mg via ORAL
  Filled 2014-03-02 (×4): qty 2

## 2014-03-02 MED ORDER — NITROGLYCERIN 1 MG/10 ML FOR IR/CATH LAB
INTRA_ARTERIAL | Status: AC
  Filled 2014-03-02: qty 10

## 2014-03-02 MED ORDER — HEPARIN (PORCINE) IN NACL 100-0.45 UNIT/ML-% IJ SOLN
1100.0000 [IU]/h | INTRAMUSCULAR | Status: DC
  Filled 2014-03-02: qty 250

## 2014-03-02 MED ORDER — MIDAZOLAM HCL 2 MG/2ML IJ SOLN
INTRAMUSCULAR | Status: AC
Start: 2014-03-02 — End: 2014-03-02
  Filled 2014-03-02: qty 2

## 2014-03-02 MED ORDER — MIDAZOLAM HCL 2 MG/2ML IJ SOLN
INTRAMUSCULAR | Status: AC
  Filled 2014-03-02: qty 2

## 2014-03-02 MED ORDER — MORPHINE SULFATE 2 MG/ML IJ SOLN
INTRAMUSCULAR | Status: AC
  Filled 2014-03-02: qty 1

## 2014-03-02 MED ORDER — LIDOCAINE HCL (PF) 1 % IJ SOLN
INTRAMUSCULAR | Status: AC
  Filled 2014-03-02: qty 30

## 2014-03-02 NOTE — Progress Notes (Signed)
ANTICOAGULATION CONSULT NOTE - Follow Up Consult  Pharmacy Consult for heparin Indication: chest pain/ACS   Labs:  Recent Labs  03/01/14 1349 03/01/14 2058 03/01/14 2104 03/02/14 0025 03/02/14 0352 03/02/14 0800 03/02/14 0833  HGB 13.6  --   --   --  13.0  --   --   HCT 41.0  --   --   --  39.2  --   --   PLT 177  --   --   --  149*  --   --   LABPROT  --   --  15.3*  --   --   --   --   INR  --   --  1.21  --   --   --   --   HEPARINUNFRC  --   --   --  0.18*  --   --  0.29*  CREATININE 0.61  --   --   --  0.67  --   --   CKTOTAL  --  20  --   --   --   --   --   CKMB  --  <1.0  --   --   --   --   --   TROPONINI <0.30 <0.30  --  <0.30  --  <0.30  --     Assessment: 58yo female with unstable angina on heparin and heparin level is slightly below goal (HL= 0.29).  Patient noted for cath today  Goal of Therapy:  Heparin level 0.3-0.7 units/ml   Plan:  -Increase heparin to 1100 units/hr -Will follow plans post cath  Hildred Laser, Pharm D 03/02/2014 10:23 AM

## 2014-03-02 NOTE — H&P (View-Only) (Signed)
    Subjective:  Denies dyspnea; intermittent chest pain last PM   Objective:  Filed Vitals:   03/02/14 0000 03/02/14 0400 03/02/14 0700 03/02/14 0956  BP: 95/51 110/51 120/66   Pulse: 69 59 62   Temp: 97.4 F (36.3 C) 97.6 F (36.4 C) 97.8 F (36.6 C)   TempSrc: Oral Oral Oral   Resp: 16 11 14    Height:      Weight:      SpO2: 100% 99% 92% 93%    Intake/Output from previous day:  Intake/Output Summary (Last 24 hours) at 03/02/14 1009 Last data filed at 03/02/14 0900  Gross per 24 hour  Intake 608.18 ml  Output    700 ml  Net -91.82 ml    Physical Exam: Physical exam: Well-developed well-nourished in no acute distress.  Skin is warm and dry.  HEENT is normal.  Neck is supple.  Chest is clear to auscultation with normal expansion.  Cardiovascular exam is regular rate and rhythm.  Abdominal exam nontender or distended. No masses palpated. Extremities show no edema. neuro grossly intact    Lab Results: Basic Metabolic Panel:  Recent Labs  03/01/14 1349 03/01/14 2104 03/02/14 0352  NA 141  --  141  K 4.6  --  4.1  CL 105  --  106  CO2 28  --  26  GLUCOSE 81  --  94  BUN 10  --  14  CREATININE 0.61  --  0.67  CALCIUM 9.6  --  8.6  MG  --  2.0  --    CBC:  Recent Labs  03/01/14 1349 03/02/14 0352  WBC 4.9 4.6  HGB 13.6 13.0  HCT 41.0 39.2  MCV 89.9 91.0  PLT 177 149*   Cardiac Enzymes:  Recent Labs  03/01/14 2058 03/02/14 0025 03/02/14 0800  CKTOTAL 20  --   --   CKMB <1.0  --   --   TROPONINI <0.30 <0.30 <0.30     Assessment/Plan:  1 unstable angina-patient has ruled out. She has had recurrent chest pain. Continue aspirin, effient (plavix nonresponder), beta blocker and statin. Continue heparin and nitroglycerin. Proceed with cardiac catheterization today for definitive evaluation. The risks and benefits were discussed and the patient agrees to proceed. 2 hypertension-continue present medications. 3 hyperlipidemia-history of  intolerance to Crestor and Lipitor. Increase Pravachol to 80 mg daily. 4 tobacco abuse-patient needs to discontinue. 5 COPD   Kirk Ruths 03/02/2014, 10:09 AM

## 2014-03-02 NOTE — Progress Notes (Signed)
ANTICOAGULATION CONSULT NOTE - Follow Up Consult  Pharmacy Consult for heparin Indication: chest pain/ACS   Labs:  Recent Labs  03/01/14 1349 03/01/14 2058 03/01/14 2104 03/02/14 0025  HGB 13.6  --   --   --   HCT 41.0  --   --   --   PLT 177  --   --   --   LABPROT  --   --  15.3*  --   INR  --   --  1.21  --   HEPARINUNFRC  --   --   --  0.18*  CREATININE 0.61  --   --   --   CKTOTAL  --  20  --   --   CKMB  --  <1.0  --   --   TROPONINI <0.30 <0.30  --  <0.30    Assessment: 57yo female subtherapeutic on heparin with initial dosing for CP, troponin remains negative.  Goal of Therapy:  Heparin level 0.3-0.7 units/ml   Plan:  Will rebolus with heparin 2000 units and increase gtt by 2 units/kg/hr to 1000 units/hr and check level in [place], PharmD, BCPS  03/02/2014,2:07 AM

## 2014-03-02 NOTE — Progress Notes (Signed)
Site area: rt groin Site Prior to Removal:  Level 0 Pressure Applied For: 20 minutes Manual:   yes Patient Status During Pull:  stable Post Pull Site:  Level 0 Post Pull Instructions Given:  yes Post Pull Pulses Present: yes Dressing Applied:  tegaderm Bedrest begins @ 1550 Comments: no complications

## 2014-03-02 NOTE — Progress Notes (Signed)
To the cath lab by bed.

## 2014-03-02 NOTE — Interval H&P Note (Signed)
History and Physical Interval Note:  03/02/2014 2:09 PM  Michele [name]  has presented today for surgery, with the diagnosis of unstable angina  The various methods of treatment have been discussed with the patient and family. After consideration of risks, benefits and other options for treatment, the patient has consented to  Procedure(s): LEFT HEART CATHETERIZATION WITH CORONARY ANGIOGRAM (N/A) as a surgical intervention .  The patient's history has been reviewed, patient examined, no change in status, stable for surgery.  I have reviewed the patient's chart and labs.  Questions were answered to the patient's satisfaction.   Cath Lab Visit (complete for each Cath Lab visit)  Clinical Evaluation Leading to the Procedure:   ACS: Yes.    Non-ACS:    Anginal Classification: CCS III  Anti-ischemic medical therapy: Maximal Therapy (2 or more classes of medications)  Non-Invasive Test Results: No non-invasive testing performed  Prior CABG: No previous CABG        Michele [name] [facility] 03/02/2014 2:09 PM

## 2014-03-02 NOTE — CV Procedure (Addendum)
   Cardiac Catheterization Procedure Note  Name: Michele [name] MRN: [id number] DOB: 10-31-1955  Procedure: Left Heart Cath, Selective Coronary Angiography, LV angiography  Indication: 58 yo WF with history of CAD s/p stenting of the mid LCx, repeat stenting of the distal LCx/ OM3 bifurcation, and stenting of the RCA presents with unstable angina.   Procedural details: Initially we attempted right radial access. The pulse was weak. Ultrasound was used and the radial artery was noted to be very small. The artery was punctured with the needle with US guidance but there was no significant flow. The radial approach was abandoned. The right groin was prepped, draped, and anesthetized with 1% lidocaine. Using modified Seldinger technique, a 5 French sheath was introduced into the right femoral artery. Standard Judkins catheters were used for coronary angiography and left ventriculography. Catheter exchanges were performed over a guidewire. There were no immediate procedural complications. The patient was transferred to the post catheterization recovery area for further monitoring.  Procedural Findings: Hemodynamics:  AO 127/64 mean 89 LV 127/17   Coronary angiography: Coronary dominance: right  Left mainstem: Normal   Left anterior descending (LAD): The LAD has mild irregularities less than 10%. The first and second diagonal branches have no significant disease.   Left circumflex (LCx): The LCx gives rise to 3 OM branches and then terminates with 2 PL branches. The stent in the mid vessel between OM2 and OM3 is patent. There is severe disease at the bifurcation of OM3 and the distal LCx with diffuse in stent restenosis in the distal LCx. This involves the ostium of OM3 as well. These vessels are small approximately 2.0 mm.  Right coronary artery (RCA): The stent in the mid RCA is patent There is 30% disease in the distal RCA.  Left ventriculography: Left ventricular systolic function is  normal, LVEF is estimated at 55-65%, there is no significant mitral regurgitation   Final Conclusions:   1. Single vessel obstructive CAD with restenosis of the distal LCx/OM3 bifurcation. 2. Normal LV function.  Recommendations: I would recommend intensive medical therapy. The distal LCx/OM bifurcation has been intervened on twice and she now has diffuse in stent restenosis. The vessel is small in caliber. Given these factors I think repeat PCI is unlikely to offer any long term benefit. Will review films with Dr. [name].  Peter Martinique, [place] 03/02/2014, 2:59 PM

## 2014-03-02 NOTE — Progress Notes (Signed)
Utilization Review Completed.Donne Anon T9/30/2015

## 2014-03-02 NOTE — Progress Notes (Signed)
Back from the cath lab awake and alert. Bedrest emphasized, instructed to call for any sign of bleeding. To press the site when coughing nor sneezing.

## 2014-03-02 NOTE — Progress Notes (Signed)
    Subjective:  Denies dyspnea; intermittent chest pain last PM   Objective:  Filed Vitals:   03/02/14 0000 03/02/14 0400 03/02/14 0700 03/02/14 0956  BP: 95/51 110/51 120/66   Pulse: 69 59 62   Temp: 97.4 F (36.3 C) 97.6 F (36.4 C) 97.8 F (36.6 C)   TempSrc: Oral Oral Oral   Resp: 16 11 14    Height:      Weight:      SpO2: 100% 99% 92% 93%    Intake/Output from previous day:  Intake/Output Summary (Last 24 hours) at 03/02/14 1009 Last data filed at 03/02/14 0900  Gross per 24 hour  Intake 608.18 ml  Output    700 ml  Net -91.82 ml    Physical Exam: Physical exam: Well-developed well-nourished in no acute distress.  Skin is warm and dry.  HEENT is normal.  Neck is supple.  Chest is clear to auscultation with normal expansion.  Cardiovascular exam is regular rate and rhythm.  Abdominal exam nontender or distended. No masses palpated. Extremities show no edema. neuro grossly intact    Lab Results: Basic Metabolic Panel:  Recent Labs  03/01/14 1349 03/01/14 2104 03/02/14 0352  NA 141  --  141  K 4.6  --  4.1  CL 105  --  106  CO2 28  --  26  GLUCOSE 81  --  94  BUN 10  --  14  CREATININE 0.61  --  0.67  CALCIUM 9.6  --  8.6  MG  --  2.0  --    CBC:  Recent Labs  03/01/14 1349 03/02/14 0352  WBC 4.9 4.6  HGB 13.6 13.0  HCT 41.0 39.2  MCV 89.9 91.0  PLT 177 149*   Cardiac Enzymes:  Recent Labs  03/01/14 2058 03/02/14 0025 03/02/14 0800  CKTOTAL 20  --   --   CKMB <1.0  --   --   TROPONINI <0.30 <0.30 <0.30     Assessment/Plan:  1 unstable angina-patient has ruled out. She has had recurrent chest pain. Continue aspirin, effient (plavix nonresponder), beta blocker and statin. Continue heparin and nitroglycerin. Proceed with cardiac catheterization today for definitive evaluation. The risks and benefits were discussed and the patient agrees to proceed. 2 hypertension-continue present medications. 3 hyperlipidemia-history of  intolerance to Crestor and Lipitor. Increase Pravachol to 80 mg daily. 4 tobacco abuse-patient needs to discontinue. 5 COPD   Kirk Ruths 03/02/2014, 10:09 AM

## 2014-03-03 DIAGNOSIS — T82857A Stenosis of cardiac prosthetic devices, implants and grafts, initial encounter: Secondary | ICD-10-CM | POA: Diagnosis not present

## 2014-03-03 DIAGNOSIS — I200 Unstable angina: Secondary | ICD-10-CM

## 2014-03-03 LAB — CBC
HCT: 36.4 % (ref 36.0–46.0)
Hemoglobin: 11.8 g/dL — ABNORMAL LOW (ref 12.0–15.0)
MCH: 28.9 pg (ref 26.0–34.0)
MCHC: 32.4 g/dL (ref 30.0–36.0)
MCV: 89 fL (ref 78.0–100.0)
Platelets: 137 10*3/uL — ABNORMAL LOW (ref 150–400)
RBC: 4.09 MIL/uL (ref 3.87–5.11)
RDW: 13.1 % (ref 11.5–15.5)
WBC: 4.2 10*3/uL (ref 4.0–10.5)

## 2014-03-03 NOTE — Care Management Note (Signed)
    Page 1 of 1   03/03/2014     3:50:32 PM CARE MANAGEMENT NOTE 03/03/2014  Patient:  Michele [name], Michele [name]   Account Number:  [id number]  Date Initiated:  03/03/2014  Documentation initiated by:  Elissa Hefty  Subjective/Objective Assessment:   adm w angina     Action/Plan:   lives w fam, pcp dr [name]   Anticipated DC Date:     Anticipated DC Plan:           Choice offered to / List presented to:             Status of service:   Medicare Important Message given?   (If response is "NO", the following Medicare IM given date fields will be blank) Date Medicare IM given:   Medicare IM given by:   Date Additional Medicare IM given:   Additional Medicare IM given by:    Discharge Disposition:    Per UR Regulation:    If discussed at Long Length of Stay Meetings, dates discussed:    Comments:  10/1 1549  debbie Michele Warzecha rn,bsn pt will have 3.60 copay for ranexa per cm sec.

## 2014-03-03 NOTE — Progress Notes (Signed)
Transferred to 3west room 03 by wheelchair, stable, belongings with pt. Report given to RN.

## 2014-03-03 NOTE — Progress Notes (Signed)
    Subjective:  Denies dyspnea; intermittent chest pain   Objective:  Filed Vitals:   03/03/14 0448 03/03/14 0825 03/03/14 0833 03/03/14 0844  BP: 96/48  128/45 117/49  Pulse:   69 75  Temp: 97.7 F (36.5 C)   97.7 F (36.5 C)  TempSrc: Oral   Oral  Resp: 15  17 16   Height:      Weight:      SpO2: 100% 94% 94% 93%    Intake/Output from previous day:  Intake/Output Summary (Last 24 hours) at 03/03/14 1030 Last data filed at 03/03/14 0846  Gross per 24 hour  Intake 1549.5 ml  Output   1100 ml  Net  449.5 ml    Physical Exam: Physical exam: Well-developed well-nourished in no acute distress.  Skin is warm and dry.  HEENT is normal.  Neck is supple.  Chest is clear to auscultation with normal expansion.  Cardiovascular exam is regular rate and rhythm.  Abdominal exam nontender or distended. No masses palpated. Right groin with no hematoma and no bruit Extremities show no edema. neuro grossly intact    Lab Results: Basic Metabolic Panel:  Recent Labs  03/01/14 1349 03/01/14 2104 03/02/14 0352 03/02/14 1720  NA 141  --  141  --   K 4.6  --  4.1  --   CL 105  --  106  --   CO2 28  --  26  --   GLUCOSE 81  --  94  --   BUN 10  --  14  --   CREATININE 0.61  --  0.67 0.67  CALCIUM 9.6  --  8.6  --   MG  --  2.0  --   --    CBC:  Recent Labs  03/02/14 1720 03/03/14 0317  WBC 4.0 4.2  HGB 12.8 11.8*  HCT 38.4 36.4  MCV 88.9 89.0  PLT 144* 137*   Cardiac Enzymes:  Recent Labs  03/01/14 2058 03/02/14 0025 03/02/14 0800  CKTOTAL 20  --   --   CKMB <1.0  --   --   TROPONINI <0.30 <0.30 <0.30     Assessment/Plan:  1 unstable angina-patient ruled out. ECG with no ST changes. She has had recurrent chest pain. Cath results noted. The patient has severe disease at the bifurcation of the third obtuse marginal and the distal circumflex with diffuse in-stent restenosis in the distal circumflex. The vessels are small and Dr. Martinique recommended medical  therapy. Some of her chest pain appears potentially to be noncardiac. Continue aspirin, effient (plavix nonresponder), beta blocker and statin; nitrates and ranexa added. Transferred to telemetry. If stable she can be discharged tomorrow morning in followup with Dr. [name]. 2 hypertension-continue present medications. 3 hyperlipidemia-history of intolerance to Crestor and Lipitor. Continue Pravachol to 80 mg daily. 4 tobacco abuse-Patient counseled on discontinuing. 5 COPD   Kirk Ruths 03/03/2014, 10:30 AM

## 2014-03-04 ENCOUNTER — Telehealth: Payer: Self-pay | Admitting: Cardiology

## 2014-03-04 DIAGNOSIS — Z9861 Coronary angioplasty status: Secondary | ICD-10-CM

## 2014-03-04 DIAGNOSIS — I2510 Atherosclerotic heart disease of native coronary artery without angina pectoris: Secondary | ICD-10-CM

## 2014-03-04 DIAGNOSIS — I10 Essential (primary) hypertension: Secondary | ICD-10-CM

## 2014-03-04 LAB — CBC
HCT: 36.1 % (ref 36.0–46.0)
Hemoglobin: 12.2 g/dL (ref 12.0–15.0)
MCH: 29.3 pg (ref 26.0–34.0)
MCHC: 33.8 g/dL (ref 30.0–36.0)
MCV: 86.6 fL (ref 78.0–100.0)
Platelets: 152 10*3/uL (ref 150–400)
RBC: 4.17 MIL/uL (ref 3.87–5.11)
RDW: 12.8 % (ref 11.5–15.5)
WBC: 4.3 10*3/uL (ref 4.0–10.5)

## 2014-03-04 MED ORDER — RANOLAZINE ER 500 MG PO TB12: 500.0000 mg | ORAL_TABLET | Freq: Two times a day (BID) | ORAL | Status: DC

## 2014-03-04 NOTE — Discharge Instructions (Signed)
PLEASE REMEMBER TO BRING ALL OF YOUR MEDICATIONS TO EACH OF YOUR FOLLOW-UP OFFICE VISITS. ° °PLEASE ATTEND ALL SCHEDULED FOLLOW-UP APPOINTMENTS.  ° °Activity: Increase activity slowly as tolerated. You may shower, but no soaking baths (or swimming) for 1 week. No driving for 2 days. No lifting over 5 lbs for 1 week. No sexual activity for 1 week.  ° °You May Return to Work: in 1 week (if applicable) ° °Wound Care: You may wash cath site gently with soap and water. Keep cath site clean and dry. If you notice pain, swelling, bleeding or pus at your cath site, please call [phone]. ° ° ° °Cardiac Cath Site Care °Refer to this sheet in the next few weeks. These instructions provide you with information on caring for yourself after your procedure. Your caregiver may also give you more specific instructions. Your treatment has been planned according to current medical practices, but problems sometimes occur. Call your caregiver if you have any problems or questions after your procedure. °HOME CARE INSTRUCTIONS °· You may shower 24 hours after the procedure. Remove the bandage (dressing) and gently wash the site with plain soap and water. Gently pat the site dry.  °· Do not apply powder or lotion to the site.  °· Do not sit in a bathtub, swimming pool, or whirlpool for 5 to 7 days.  °· No bending, squatting, or lifting anything over 10 pounds (4.5 kg) as directed by your caregiver.  °· Inspect the site at least twice daily.  °· Do not drive home if you are discharged the same day of the procedure. Have someone else drive you.  °· You may drive 24 hours after the procedure unless otherwise instructed by your caregiver.  °What to expect: °· Any bruising will usually fade within 1 to 2 weeks.  °· Blood that collects in the tissue (hematoma) may be painful to the touch. It should usually decrease in size and tenderness within 1 to 2 weeks.  °SEEK IMMEDIATE MEDICAL CARE IF: °· You have unusual pain at the site or down the  affected limb.  °· You have redness, warmth, swelling, or pain at the site.  °· You have drainage (other than a small amount of blood on the dressing).  °· You have chills.  °· You have a fever or persistent symptoms for more than 72 hours.  °· You have a fever and your symptoms suddenly get worse.  °· Your leg becomes pale, cool, tingly, or numb.  °· You have heavy bleeding from the site. Hold pressure on the site.  °Document Released: 06/22/2010 Document Revised: 05/09/2011 Document Reviewed: 06/22/2010 °ExitCare® Patient Information ©2012 [employer]. ° °

## 2014-03-04 NOTE — Telephone Encounter (Signed)
Closed encounter °

## 2014-03-04 NOTE — Progress Notes (Signed)
Patient Name: Michele [name] Date of Encounter: 03/04/2014  Principal Problem:   Unstable angina Active Problems:   Dyslipidemia, goal LDL below 70   SEIZURE DISORDER   CAD -S/P MI-PCI AVG 5/13 then staged DES to RCA  11/06/11. ISR- PCI 11/19/12, cath 02/18/13- no ISR, + spasm, Myoview low risk Nov 2014   Tobacco abuse, ongoing   Coronary artery spasm, hx of    Patient Profile: 58 yo WF with history of CAD s/p stenting of the mid LCx, repeat stenting of the distal LCx/ OM3 bifurcation, and stenting of the RCA presented with unstable angina 09/29. Cath 09/30 w/ med rx of small vessel dz. Cath results below.  SUBJECTIVE: No more chest pain, no SOB. Concerned about restarting exercising, afraid she will not be able to quit smoking (husband smokes).  OBJECTIVE Filed Vitals:   03/03/14 1615 03/03/14 1850 03/03/14 2037 03/04/14 0444  BP: 130/59 121/57 138/74 113/51  Pulse: 55 64 61 66  Temp: 98.3 F (36.8 C) 98.4 F (36.9 C) 97.9 F (36.6 C) 98 F (36.7 C)  TempSrc: Oral Oral Oral Oral  Resp: 21 18 18 18   Height:      Weight:    162 lb 1.6 oz (73.528 kg)  SpO2: 98% 96% 93% 92%    Intake/Output Summary (Last 24 hours) at 03/04/14 0754 Last data filed at 03/03/14 1800  Gross per 24 hour  Intake    860 ml  Output   1800 ml  Net   -940 ml   Filed Weights   03/01/14 1817 03/04/14 0444  Weight: 162 lb 1.6 oz (73.528 kg) 162 lb 1.6 oz (73.528 kg)    PHYSICAL EXAM General: Well developed, well nourished, female in no acute distress. Head: Normocephalic, atraumatic.  Neck: Supple without bruits, JVD not elevated. Lungs:  Resp regular and unlabored, CTA. Heart: RRR, S1, S2, no S3, S4, no murmur; no rub. Abdomen: Soft, non-tender, non-distended, BS + x 4.  Extremities: No clubbing, cyanosis, no edema.  Neuro: Alert and oriented X 3. Moves all extremities spontaneously. Psych: Normal affect.  LABS: CBC: Recent Labs  03/03/14 0317 03/04/14 0440  WBC 4.2 4.3  HGB  11.8* 12.2  HCT 36.4 36.1  MCV 89.0 86.6  PLT 137* 152   INR: Recent Labs  03/01/14 2104  INR 3.70   Basic Metabolic Panel: Recent Labs  03/01/14 1349 03/01/14 2104 03/02/14 0352 03/02/14 1720  NA 141  --  141  --   K 4.6  --  4.1  --   CL 105  --  106  --   CO2 28  --  26  --   GLUCOSE 81  --  94  --   BUN 10  --  14  --   CREATININE 0.61  --  0.67 0.67  CALCIUM 9.6  --  8.6  --   MG  --  2.0  --   --    Liver Function Tests: Recent Labs  03/01/14 2104  AST 15  ALT 10  ALKPHOS 61  BILITOT 0.3  PROT 6.3  ALBUMIN 3.2*   Cardiac Enzymes: Recent Labs  03/01/14 2058 03/02/14 0025 03/02/14 0800  CKTOTAL 20  --   --   CKMB <1.0  --   --   TROPONINI <0.30 <0.30 <0.30   Fasting Lipid Panel: Recent Labs  03/02/14 0352  CHOL 136  HDL 27*  LDLCALC 87  TRIG 111  CHOLHDL 5.0   Thyroid  Function Tests: Recent Labs  03/01/14 2104  TSH 3.270    TELE:  SR, sinus brady into the high 40s    Cath Results:  Final Conclusions:  1. Single vessel obstructive CAD with restenosis of the distal LCx/OM3 bifurcation.  2. Normal LV function. Recommendations: I would recommend intensive medical therapy. The distal LCx/OM bifurcation has been intervened on twice and she now has diffuse in stent restenosis. The vessel is small in caliber. Given these factors I think repeat PCI is unlikely to offer any long term benefit. Will review films with Dr. [name].  Home Meds: Prior to Admission medications   Medication Sig  Calcium Carbonate-Vitamin D (CALCIUM 600+D PO) Take 1 tablet by mouth 2 (two) times daily.   carvedilol (COREG) 3.125 MG tablet Take 1 tablet (3.125 mg total) by mouth 2 (two) times daily with a meal.  clonazePAM (KLONOPIN) 2 MG tablet Take 2 mg by mouth 3 (three) times daily as needed for anxiety.   Coenzyme Q10 (COQ10) 100 MG CAPS Take 100 mg by mouth daily.   isosorbide mononitrate (IMDUR) 30 MG 24 hr tablet Take 1 tablet (30 mg total) by mouth at bedtime.   nitroGLYCERIN (NITROSTAT) 0.4 MG SL tablet Place 0.4 mg under the tongue every 5 (five) minutes as needed for chest pain.  omega-3 acid ethyl esters (LOVAZA) 1 G capsule Take 1 g by mouth 2 (two) times daily.  pantoprazole (PROTONIX) 40 MG tablet Take 1 tablet (40 mg total) by mouth 2 (two) times daily before a meal.  prasugrel (EFFIENT) 10 MG TABS tablet Take 1 tablet (10 mg total) by mouth daily.  pravastatin (PRAVACHOL) 20 MG tablet Take 20 mg by mouth at bedtime.  tiotropium (SPIRIVA) 18 MCG inhalation capsule Place 18 mcg into inhaler and inhale daily.    Current Medications:  . aspirin EC  81 mg Oral Daily  . calcium-vitamin D  1 tablet Oral Q breakfast  . carvedilol  3.125 mg Oral BID WC  . heparin  5,000 Units Subcutaneous 3 times per day  . isosorbide mononitrate  30 mg Oral Daily  . omega-3 acid ethyl esters  1 g Oral BID  . pantoprazole  40 mg Oral BID AC  . prasugrel  10 mg Oral Daily  . pravastatin  80 mg Oral QHS  . ranolazine  500 mg Oral BID  . tiotropium  18 mcg Inhalation Daily   . sodium chloride 10 mL/hr at 03/01/14 1900    ASSESSMENT AND PLAN: Principal Problem:   Unstable angina - med rx recommended, see med list above, ambulate and see how tolerated. Also w/ hx spasm, emphasized the importance of quitting tobacco  Active Problems:   Dyslipidemia, goal LDL below 70 - on pravastatin 80 mg, allergic to Crestor and Lipitor, Zocor 40 mg is cheaper at [facility], change her?    SEIZURE DISORDER - no rx PTA, continue home dose Klononpin    CAD -S/P MI-PCI AVG 5/13 then staged DES to RCA  11/06/11. ISR- PCI 11/19/12, cath 02/18/13- no ISR, + spasm, Myoview low risk Nov 2014 - see above    Tobacco abuse, ongoing - cessation strongly advised    Coronary artery spasm, hx of - on Imdur, ranolazine    Sinus bradycardia - seen on telemetry, HR frequently high 40s, not always while asleep. No sx, continue current BB  Plan - d/c today.  Jonetta Speak ,  PA-C 7:54 AM 03/04/2014

## 2014-03-04 NOTE — Progress Notes (Signed)
Pt discharged to home per MD order. Pt received and reviewed all discharge instructions and medication information including follow-up appointments and prescription information. Pt verbalized understanding. Pt alert and oriented at discharge with no complaints of pain. Pt IV and telemetry box removed prior to discharge. Pt escorted to private vehicle via wheelchair by guest services volunteer. Lenna Sciara

## 2014-03-04 NOTE — Discharge Summary (Signed)
The patient has done well. Meds have been adjusted and she is ready for discharge to f/u with primary cardiologist is 2 weeks. The patient refuses high dose statin therapy( prior MS intolerance) and should be considered for a PSK-9 as OP. The patient will be discharged with followup planned as listed.

## 2014-03-04 NOTE — Progress Notes (Addendum)
The patient has done well. Meds have been adjusted and she is ready for discharge to f/u with primary cardiologist is 2 weeks.  The patient refuses high dose statin therapy( prior MS intolerance) and should be considered for a PSK-9 as OP.

## 2014-03-04 NOTE — Progress Notes (Signed)
Medicare Important Message given?  YES (If response is "NO", the following Medicare IM given date fields will be blank) Date Medicare IM given:  03/04/2014 Medicare IM given by:  [facility]

## 2014-03-04 NOTE — Progress Notes (Signed)
CARDIAC REHAB PHASE I   PRE:  Rate/Rhythm: 69 off monitor  BP:  Supine:   Sitting: 124/74  Standing:    SaO2: 94 RA  MODE:  Ambulation: 700 ft   POST:  Rate/Rhythm: 67  BP:  Supine:   Sitting: 124/66  Standing:    SaO2: 95 RA 1100-1210 Pt tolerated ambulation well only c/o was of back pain walking. VS stable . Completed discharge education with pt and significant other. She voices understanding. We discussed smoking cessation. Pt states that she wants to quit. I gave her tips for quitting, coaching contact number and quit smart class information. She quit using nicotine inhaler in the past. Pt has stayed quit for as long as 2 and 1/2 years. We discussed triggers for relapse. Pt agrees to [place]. [facility] in [place] will send referral. She admits that it will be hard to quit with her boyfriend smoking. I stressed to her that this was about her and her heart condition and that needs to drive her efforts to quit.  Rodney Langton RN 03/04/2014 12:07 PM

## 2014-03-04 NOTE — Discharge Summary (Signed)
CARDIOLOGY DISCHARGE SUMMARY   Patient ID: Michele [name] MRN: [id number] DOB/AGE: 1956/01/18 58 y.o.  Admit date: 03/01/2014 Discharge date: 03/04/2014  PCP: [name], MD Primary Cardiologist: Dr. [name]  Primary Discharge Diagnosis:   Unstable angina Secondary Discharge Diagnosis:    Dyslipidemia, goal LDL below 70   SEIZURE DISORDER   CAD -S/P MI-PCI AVG 5/13 then staged DES to RCA  11/06/11. ISR- PCI 11/19/12, cath 02/18/13- no ISR, + spasm, Myoview low risk Nov 2014   Tobacco abuse, ongoing   Coronary artery spasm, hx of  Procedures:  Left Heart Cath, Selective Coronary Angiography, LV angiography  Hospital Course: Michele [name] is a 58 y.o. female with a history of CAD. She had recurrent chest pain and came to the hospital, where she was admitted for further evaluation and treatment.  Her cardiac enzymes were negative for MI. A lipid profile was performed, results below. She was on low-dose Pravachol prior to admission but does not wish to increase this to 2 musculoskeletal problems with previous statin therapy. She should be considered for a PSK-9 as an outpatient   She had had recurrent pain PTA, so a cardiac catheterization was performed on 10/01, to further define her anatomy. Catheterization results are below. She had restenosis in her CFX/OM bifurcation lesion, but the vessels are small and it was felt PCI would not offer any long-term benefit. CRF reduction and medical therapy were recommended. She had been on Imdur, and had Ranexa added to her medication regimen.  On 10/02, she was seen by Dr. Tamala Julian and all data were reviewed. She was seen by cardiac rehab. Heart-healthy lifestyle modifications were discussed and the importance of smoking cessation was emphasized. She was also given exercise guidelines. She was ambulates without chest pain or shortness of breath and no further inpatient workup was indicated. She is considered stable for discharge, to follow up  as an outpatient.   Labs:   Lab Results  Component Value Date   WBC 4.3 03/04/2014   HGB 12.2 03/04/2014   HCT 36.1 03/04/2014   MCV 86.6 03/04/2014   PLT 152 03/04/2014     Recent Labs Lab 03/01/14 2104 03/02/14 0352 03/02/14 1720  NA  --  141  --   K  --  4.1  --   CL  --  106  --   CO2  --  26  --   BUN  --  14  --   CREATININE  --  0.67 0.67  CALCIUM  --  8.6  --   PROT 6.3  --   --   BILITOT 0.3  --   --   ALKPHOS 61  --   --   ALT 10  --   --   AST 15  --   --   GLUCOSE  --  94  --     Recent Labs  03/01/14 2058 03/02/14 0025 03/02/14 0800  CKTOTAL 20  --   --   CKMB <1.0  --   --   TROPONINI <0.30 <0.30 <0.30   Lipid Panel     Component Value Date/Time   CHOL 136 03/02/2014 0352   TRIG 111 03/02/2014 0352   HDL 27* 03/02/2014 0352   CHOLHDL 5.0 03/02/2014 0352   VLDL 22 03/02/2014 0352   LDLCALC 87 03/02/2014 0352   LDLDIRECT 92.0 07/03/2011 1424    Recent Labs  03/01/14 2104  INR 1.21      Radiology: Dg  Chest Port 1 View 03/01/2014   CLINICAL DATA:  Chest pain.  EXAM: PORTABLE CHEST - 1 VIEW  COMPARISON:  December 18, 2013.  FINDINGS: Cardiomediastinal silhouette appears stable. No pneumothorax is noted. Emphysematous disease is noted in the right upper lobe which is stable. No acute pulmonary disease is noted. No significant pleural effusion is noted.  IMPRESSION: No acute cardiopulmonary abnormality seen.   Electronically Signed   By: Sabino Dick M.D.   On: 03/01/2014 14:23    Cardiac Cath: 03/03/2014 Coronary angiography:  Coronary dominance: right  Left mainstem: Normal  Left anterior descending (LAD): The LAD has mild irregularities less than 10%. The first and second diagonal branches have no significant disease.  Left circumflex (LCx): The LCx gives rise to 3 OM branches and then terminates with 2 PL branches. The stent in the mid vessel between OM2 and OM3 is patent. There is severe disease at the bifurcation of OM3 and the distal LCx with diffuse  in stent restenosis in the distal LCx. This involves the ostium of OM3 as well. These vessels are small approximately 2.0 mm.  Right coronary artery (RCA): The stent in the mid RCA is patent There is 30% disease in the distal RCA.  Left ventriculography: Left ventricular systolic function is normal, LVEF is estimated at 55-65%, there is no significant mitral regurgitation  Final Conclusions:  1. Single vessel obstructive CAD with restenosis of the distal LCx/OM3 bifurcation.  2. Normal LV function.  Recommendations: I would recommend intensive medical therapy. The distal LCx/OM bifurcation has been intervened on twice and she now has diffuse in stent restenosis. The vessel is small in caliber. Given these factors I think repeat PCI is unlikely to offer any long term benefit. Will review films with Dr. [name].  EKG: 03/02/2014 SR, no acute ischemic changes Vent. rate 59 BPM PR interval 160 ms QRS duration 92 ms QT/QTc 442/437 ms P-R-T axes 56 28 17  FOLLOW UP PLANS AND APPOINTMENTS Allergies  Allergen Reactions  . Ibuprofen Other (See Comments)    GI upset  . Aspirin Other (See Comments)     GI upset (takes low dose aspirin at night) - Stomach ache - No stomach bleed Pt can take enteric coated  . Ciprofloxacin     Low blood sugar, shakiness  . Crestor [Rosuvastatin]     Myalgias   . Lipitor [Atorvastatin]     Myalgias   . Plavix [Clopidogrel Bisulfate] Other (See Comments)    NON-RESPONDER   202 ASSAY  . Sulfonamide Derivatives Itching and Rash  . Wellbutrin [Bupropion] Palpitations     Medication List         CALCIUM 600+D PO  Take 1 tablet by mouth 2 (two) times daily.     carvedilol 3.125 MG tablet  Commonly known as:  COREG  Take 1 tablet (3.125 mg total) by mouth 2 (two) times daily with a meal.     clonazePAM 2 MG tablet  Commonly known as:  KLONOPIN  Take 2 mg by mouth 3 (three) times daily as needed for anxiety.     CoQ10 100 MG Caps  Take 100 mg by mouth  daily.     isosorbide mononitrate 30 MG 24 hr tablet  Commonly known as:  IMDUR  Take 1 tablet (30 mg total) by mouth at bedtime.     nitroGLYCERIN 0.4 MG SL tablet  Commonly known as:  NITROSTAT  Place 0.4 mg under the tongue every 5 (five) minutes as needed for  chest pain.     omega-3 acid ethyl esters 1 G capsule  Commonly known as:  LOVAZA  Take 1 g by mouth 2 (two) times daily.     pantoprazole 40 MG tablet  Commonly known as:  PROTONIX  Take 1 tablet (40 mg total) by mouth 2 (two) times daily before a meal.     prasugrel 10 MG Tabs tablet  Commonly known as:  EFFIENT  Take 1 tablet (10 mg total) by mouth daily.     pravastatin 20 MG tablet  Commonly known as:  PRAVACHOL  Take 20 mg by mouth at bedtime.     ranolazine 500 MG 12 hr tablet  Commonly known as:  RANEXA  Take 1 tablet (500 mg total) by mouth 2 (two) times daily.     tiotropium 18 MCG inhalation capsule  Commonly known as:  SPIRIVA  Place 18 mcg into inhaler and inhale daily.        Discharge Instructions   Diet - low sodium heart healthy    Complete by:  As directed      Increase activity slowly    Complete by:  As directed           Follow-up Information   Follow up with Leonie Man, MD. (The office will call)    Specialty:  Cardiology   Contact information:   [address] [place] [id number] [place]  Time spent with patient to include physician time: 45 min Signed: Rosaria Ferries, PA-C 03/04/2014, 11:20 AM Co-Sign MD

## 2014-03-06 IMAGING — CR DG CHEST 2V
2 series · 2 of 2 positions shown · non-contrast
Comparison: Chest x-ray of 05/04/2011

CLINICAL DATA: Chest pain, post cardiac stent placement

CHEST - 2 VIEW

[w chest pa]
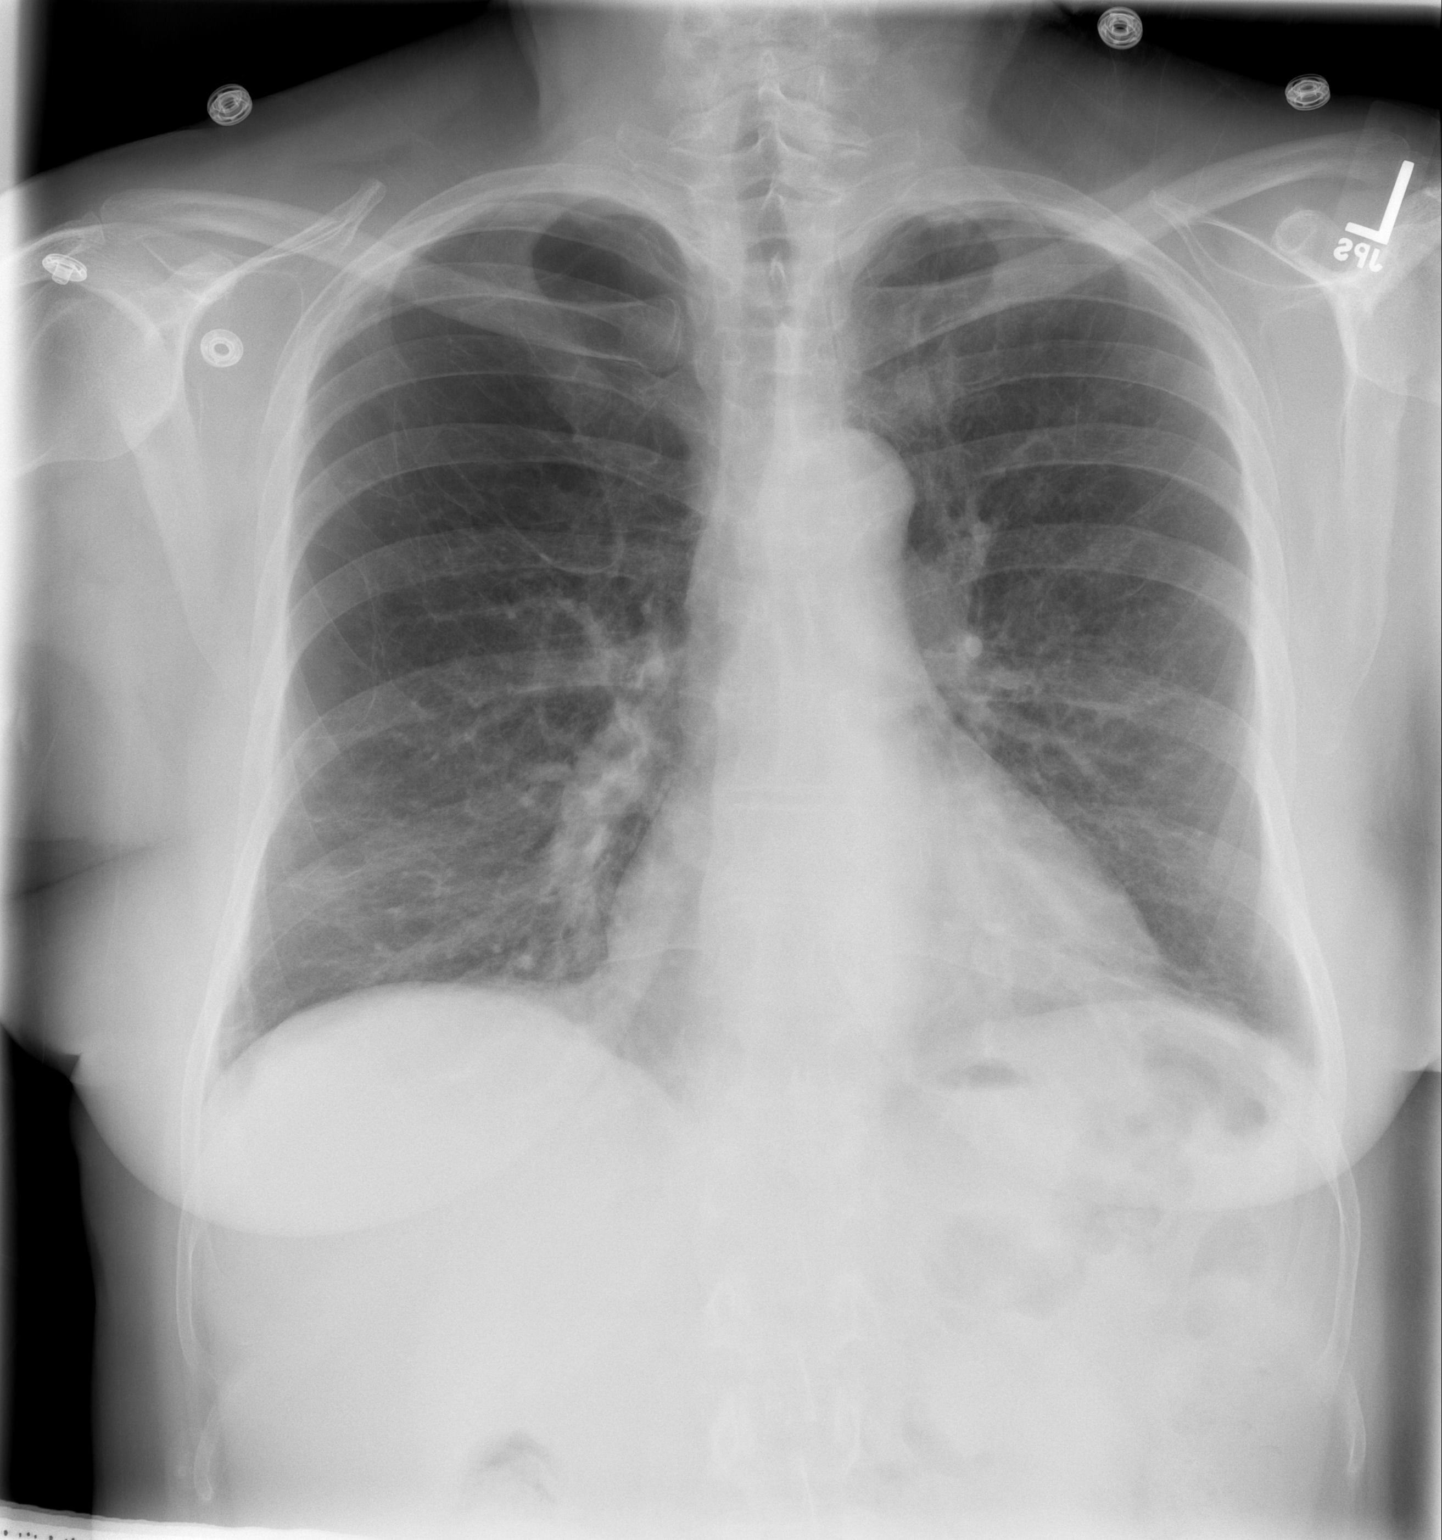

[w chest lat]
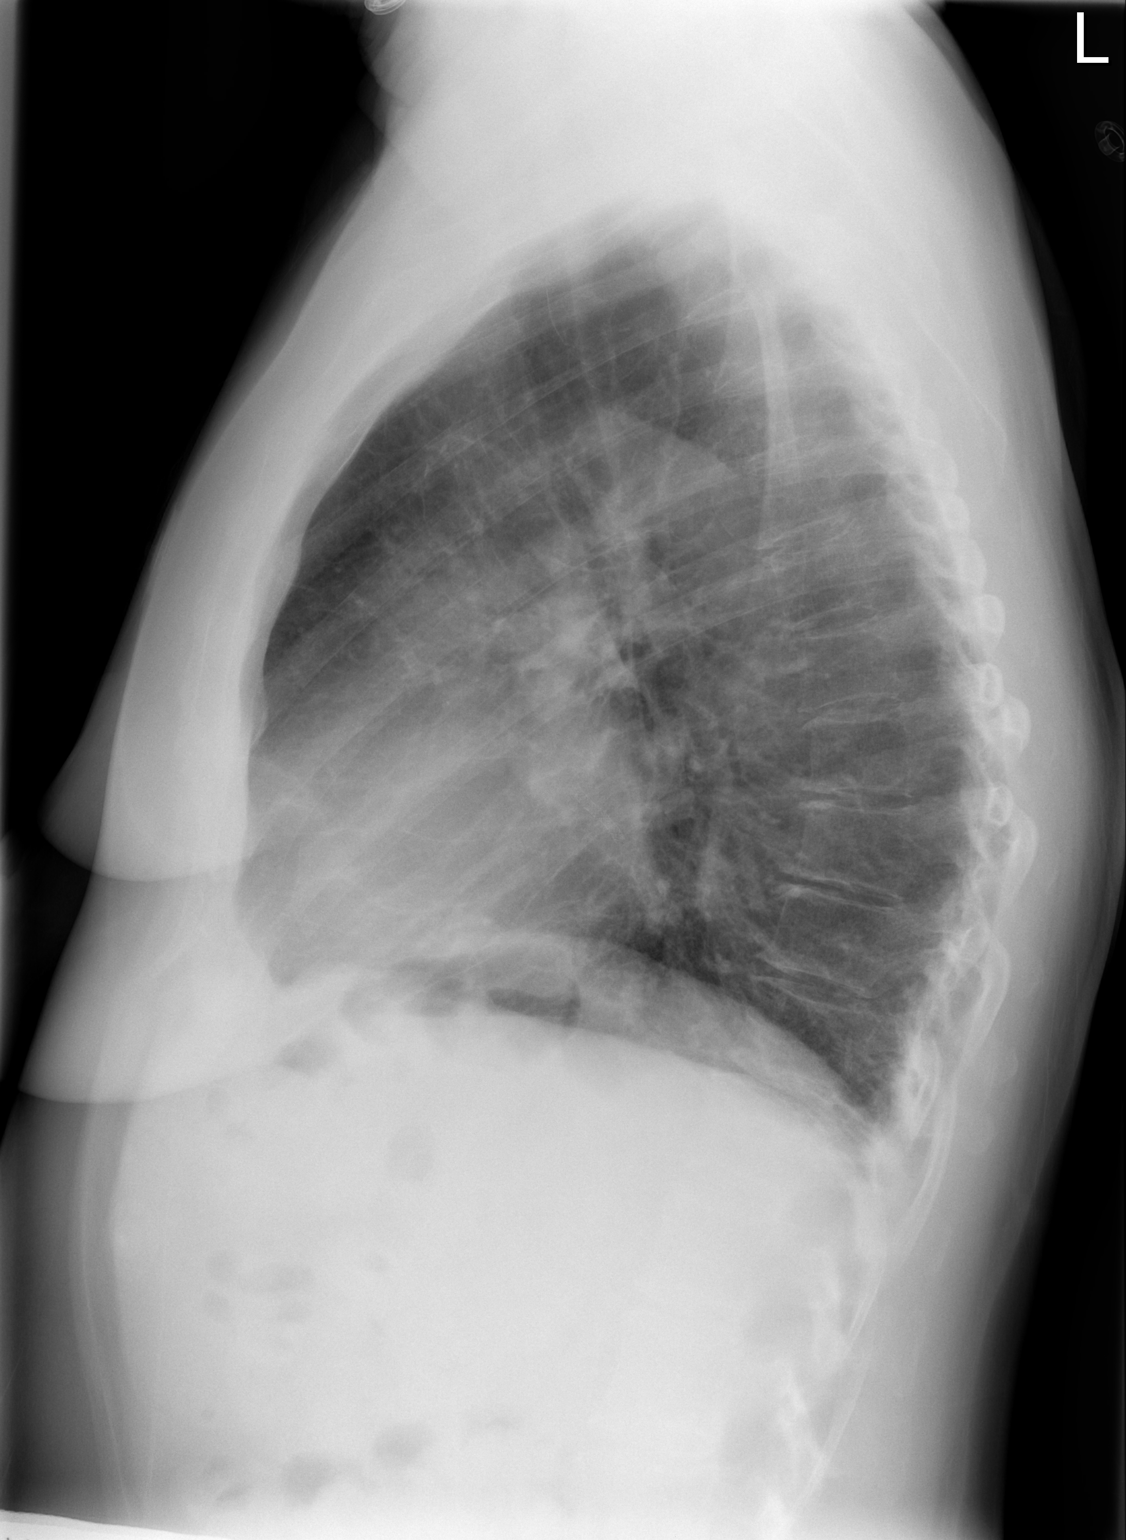

[2 of 2 positions shown; findings below may reference images not displayed]

FINDINGS: No active infiltrate or effusion is seen.  Minimal linear
scarring or atelectasis is noted at the left lung base.
Mediastinal contours appear stable.  The heart is within upper
limits of normal.  No acute bony abnormality is seen.
IMPRESSION: Stable chest x-ray.  No active lung disease.

## 2014-03-17 DIAGNOSIS — N3020 Other chronic cystitis without hematuria: Secondary | ICD-10-CM | POA: Diagnosis not present

## 2014-03-21 ENCOUNTER — Ambulatory Visit (INDEPENDENT_AMBULATORY_CARE_PROVIDER_SITE_OTHER): Payer: Medicare Other | Admitting: Cardiology

## 2014-03-21 ENCOUNTER — Encounter: Payer: Self-pay | Admitting: Cardiology

## 2014-03-21 VITALS — BP 122/72 | HR 77 | Ht 65.5 in | Wt 161.2 lb

## 2014-03-21 DIAGNOSIS — Z9861 Coronary angioplasty status: Secondary | ICD-10-CM

## 2014-03-21 DIAGNOSIS — E785 Hyperlipidemia, unspecified: Secondary | ICD-10-CM

## 2014-03-21 DIAGNOSIS — I2510 Atherosclerotic heart disease of native coronary artery without angina pectoris: Secondary | ICD-10-CM

## 2014-03-21 DIAGNOSIS — I200 Unstable angina: Secondary | ICD-10-CM | POA: Diagnosis not present

## 2014-03-21 DIAGNOSIS — Z720 Tobacco use: Secondary | ICD-10-CM

## 2014-03-21 DIAGNOSIS — F329 Major depressive disorder, single episode, unspecified: Secondary | ICD-10-CM | POA: Diagnosis not present

## 2014-03-21 DIAGNOSIS — R0789 Other chest pain: Secondary | ICD-10-CM

## 2014-03-21 DIAGNOSIS — F32A Depression, unspecified: Secondary | ICD-10-CM

## 2014-03-21 DIAGNOSIS — R002 Palpitations: Secondary | ICD-10-CM

## 2014-03-21 LAB — BASIC METABOLIC PANEL
BUN: 11 mg/dL (ref 6–23)
CO2: 29 mEq/L (ref 19–32)
Calcium: 9.5 mg/dL (ref 8.4–10.5)
Chloride: 106 mEq/L (ref 96–112)
Creat: 0.69 mg/dL (ref 0.50–1.10)
Glucose, Bld: 89 mg/dL (ref 70–99)
Potassium: 4.8 mEq/L (ref 3.5–5.3)
Sodium: 141 mEq/L (ref 135–145)

## 2014-03-21 MED ORDER — RANOLAZINE ER 500 MG PO TB12: 500.0000 mg | ORAL_TABLET | Freq: Two times a day (BID) | ORAL | Status: DC

## 2014-03-21 NOTE — Assessment & Plan Note (Signed)
We discussed importance of stopping tobacco.

## 2014-03-21 NOTE — Assessment & Plan Note (Signed)
She seems depressed today and has trouble focusing on her issues, she has several issues, from UTI to CAD and understanding her CAD.

## 2014-03-21 NOTE — Assessment & Plan Note (Signed)
Some rapid heart rates at times.

## 2014-03-21 NOTE — Assessment & Plan Note (Signed)
LDL is 87, continue pravachol.

## 2014-03-21 NOTE — Assessment & Plan Note (Signed)
 ,   May 2013Inferior STEMI: Occluded circumflex after OM 2. Overlapping Promus Premier DES stents 2.5 mm x 12 mm distally into OM 3 with a proximal 2.5 x 8 mm stent up to OM 2. Postdilated in mid stent 2.75 mm.  June 2013: Staged PCI of mid RCA: Promus Premier 2.5 mm 24 mm postdilated to 2.75 mm. Residual 50% distal RCA  June 2014 Unstable angina: Severe distal ISR of stented OM 3 - bifurcation PCI with stent placed through existing stent from distal stent into the AV groove Promus Premier 2.5 mm x 12 mm postdilated to 0.65 mm; AngioSculpt PTCA of the ostial OM 3 stented segment with a 2.0 balloon to 2.2 mm  Cath 03/02/14 with in stent restenosis of the distal LCX/OM3 bifurcation.  It has been intervened upon twice before will treat medically- Dr. Martinique did not feel PCI would give any long benifit.    Will increase renexa to 500 mg BID, discussed with Dr. Roni Bread.  If she has side effects she will discuss with him on next apt 04/04/14.
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
## 2014-03-21 NOTE — Patient Instructions (Signed)
Your physician recommends that you keep your follow-up appointment on November 2 with Dr. [name].  Your physician recommends that you return for lab work today.     It is highly recommended that you stop smoking. We have enclosed below some tips to help assist you with this.     Smoking Cessation, Tips for Success If you are ready to quit smoking, congratulations! You have chosen to help yourself be healthier. Cigarettes bring nicotine, tar, carbon monoxide, and other irritants into your body. Your lungs, heart, and blood vessels will be able to work better without these poisons. There are many different ways to quit smoking. Nicotine gum, nicotine patches, a nicotine inhaler, or nicotine nasal spray can help with physical craving. Hypnosis, support groups, and medicines help break the habit of smoking. WHAT THINGS CAN I DO TO MAKE QUITTING EASIER?  Here are some tips to help you quit for good:  Pick a date when you will quit smoking completely. Tell all of your friends and family about your plan to quit on that date.  Do not try to slowly cut down on the number of cigarettes you are smoking. Pick a quit date and quit smoking completely starting on that day.  Throw away all cigarettes.   Clean and remove all ashtrays from your home, work, and car.  On a card, write down your reasons for quitting. Carry the card with you and read it when you get the urge to smoke.  Cleanse your body of nicotine. Drink enough water and fluids to keep your urine clear or pale yellow. Do this after quitting to flush the nicotine from your body.  Learn to predict your moods. Do not let a bad situation be your excuse to have a cigarette. Some situations in your life might tempt you into wanting a cigarette.  Never have "just one" cigarette. It leads to wanting another and another. Remind yourself of your decision to quit.  Change habits associated with smoking. If you smoked while driving or when feeling  stressed, try other activities to replace smoking. Stand up when drinking your coffee. Brush your teeth after eating. Sit in a different chair when you read the paper. Avoid alcohol while trying to quit, and try to drink fewer caffeinated beverages. Alcohol and caffeine may urge you to smoke.  Avoid foods and drinks that can trigger a desire to smoke, such as sugary or spicy foods and alcohol.  Ask people who smoke not to smoke around you.  Have something planned to do right after eating or having a cup of coffee. For example, plan to take a walk or exercise.  Try a relaxation exercise to calm you down and decrease your stress. Remember, you may be tense and nervous for the first 2 weeks after you quit, but this will pass.  Find new activities to keep your hands busy. Play with a pen, coin, or rubber band. Doodle or draw things on paper.  Brush your teeth right after eating. This will help cut down on the craving for the taste of tobacco after meals. You can also try mouthwash.   Use oral substitutes in place of cigarettes. Try using lemon drops, carrots, cinnamon sticks, or chewing gum. Keep them handy so they are available when you have the urge to smoke.  When you have the urge to smoke, try deep breathing.  Designate your home as a nonsmoking area.  If you are a heavy smoker, ask your health care provider about a  prescription for nicotine chewing gum. It can ease your withdrawal from nicotine.  Reward yourself. Set aside the cigarette money you save and buy yourself something nice.  Look for support from others. Join a support group or smoking cessation program. Ask someone at home or at work to help you with your plan to quit smoking.  Always ask yourself, "Do I need this cigarette or is this just a reflex?" Tell yourself, "Today, I choose not to smoke," or "I do not want to smoke." You are reminding yourself of your decision to quit.  Do not replace cigarette smoking with  electronic cigarettes (commonly called e-cigarettes). The safety of e-cigarettes is unknown, and some may contain harmful chemicals.  If you relapse, do not give up! Plan ahead and think about what you will do the next time you get the urge to smoke. HOW WILL I FEEL WHEN I QUIT SMOKING? You may have symptoms of withdrawal because your body is used to nicotine (the addictive substance in cigarettes). You may crave cigarettes, be irritable, feel very hungry, cough often, get headaches, or have difficulty concentrating. The withdrawal symptoms are only temporary. They are strongest when you first quit but will go away within 10-14 days. When withdrawal symptoms occur, stay in control. Think about your reasons for quitting. Remind yourself that these are signs that your body is healing and getting used to being without cigarettes. Remember that withdrawal symptoms are easier to treat than the major diseases that smoking can cause.  Even after the withdrawal is over, expect periodic urges to smoke. However, these cravings are generally short lived and will go away whether you smoke or not. Do not smoke! WHAT RESOURCES ARE AVAILABLE TO HELP ME QUIT SMOKING? Your health care provider can direct you to community resources or hospitals for support, which may include:  Group support.  Education.  Hypnosis.  Therapy. Document Released: 02/16/2004 Document Revised: 10/04/2013 Document Reviewed: 11/05/2012 [facility] Patient Information 2015 [place], [place]. This information is not intended to replace advice given to you by your health care provider. Make sure you discuss any questions you have with your health care provider.

## 2014-03-22 ENCOUNTER — Telehealth: Payer: Self-pay | Admitting: *Deleted

## 2014-03-22 NOTE — Telephone Encounter (Signed)
Message copied by Raiford Simmonds on Tue Mar 22, 2014  1:04 PM ------      Message from: Isaiah Serge      Created: Mon Mar 21, 2014  7:27 PM       kidney function is excellent ------

## 2014-03-22 NOTE — Telephone Encounter (Signed)
Spoke to patient. Result given . Verbalized understanding  

## 2014-04-01 DIAGNOSIS — R739 Hyperglycemia, unspecified: Secondary | ICD-10-CM | POA: Diagnosis not present

## 2014-04-01 DIAGNOSIS — K219 Gastro-esophageal reflux disease without esophagitis: Secondary | ICD-10-CM | POA: Diagnosis not present

## 2014-04-01 DIAGNOSIS — N390 Urinary tract infection, site not specified: Secondary | ICD-10-CM | POA: Diagnosis not present

## 2014-04-01 DIAGNOSIS — E785 Hyperlipidemia, unspecified: Secondary | ICD-10-CM | POA: Diagnosis not present

## 2014-04-01 DIAGNOSIS — I2510 Atherosclerotic heart disease of native coronary artery without angina pectoris: Secondary | ICD-10-CM | POA: Diagnosis not present

## 2014-04-01 DIAGNOSIS — J449 Chronic obstructive pulmonary disease, unspecified: Secondary | ICD-10-CM | POA: Diagnosis not present

## 2014-04-01 DIAGNOSIS — B009 Herpesviral infection, unspecified: Secondary | ICD-10-CM | POA: Diagnosis not present

## 2014-04-01 DIAGNOSIS — F419 Anxiety disorder, unspecified: Secondary | ICD-10-CM | POA: Diagnosis not present

## 2014-04-04 ENCOUNTER — Other Ambulatory Visit: Payer: Self-pay | Admitting: *Deleted

## 2014-04-04 ENCOUNTER — Encounter: Payer: Self-pay | Admitting: Cardiology

## 2014-04-04 ENCOUNTER — Ambulatory Visit (INDEPENDENT_AMBULATORY_CARE_PROVIDER_SITE_OTHER): Payer: Medicare Other | Admitting: Cardiology

## 2014-04-04 ENCOUNTER — Ambulatory Visit: Payer: Medicare Other | Admitting: Cardiology

## 2014-04-04 VITALS — BP 120/82 | HR 72 | Ht 65.0 in | Wt 160.3 lb

## 2014-04-04 DIAGNOSIS — R5382 Chronic fatigue, unspecified: Secondary | ICD-10-CM

## 2014-04-04 DIAGNOSIS — I200 Unstable angina: Secondary | ICD-10-CM

## 2014-04-04 DIAGNOSIS — R030 Elevated blood-pressure reading, without diagnosis of hypertension: Secondary | ICD-10-CM | POA: Diagnosis not present

## 2014-04-04 DIAGNOSIS — I2510 Atherosclerotic heart disease of native coronary artery without angina pectoris: Secondary | ICD-10-CM | POA: Diagnosis not present

## 2014-04-04 DIAGNOSIS — R5381 Other malaise: Secondary | ICD-10-CM

## 2014-04-04 DIAGNOSIS — F17200 Nicotine dependence, unspecified, uncomplicated: Secondary | ICD-10-CM

## 2014-04-04 DIAGNOSIS — I201 Angina pectoris with documented spasm: Secondary | ICD-10-CM

## 2014-04-04 DIAGNOSIS — E785 Hyperlipidemia, unspecified: Secondary | ICD-10-CM

## 2014-04-04 DIAGNOSIS — I10 Essential (primary) hypertension: Secondary | ICD-10-CM

## 2014-04-04 DIAGNOSIS — Z720 Tobacco use: Secondary | ICD-10-CM

## 2014-04-04 DIAGNOSIS — F17210 Nicotine dependence, cigarettes, uncomplicated: Secondary | ICD-10-CM | POA: Diagnosis not present

## 2014-04-04 DIAGNOSIS — Z9861 Coronary angioplasty status: Secondary | ICD-10-CM | POA: Diagnosis not present

## 2014-04-04 MED ORDER — OMEGA-3-ACID ETHYL ESTERS 1 G PO CAPS: 1.0000 g | ORAL_CAPSULE | Freq: Two times a day (BID) | ORAL | Status: DC

## 2014-04-04 NOTE — Patient Instructions (Addendum)
Ranexa for 2 weeks only take at night then switch to 2 weeks in morning only doses. If no changes in your symptoms go back to twice a day  Take protonix every other day    Your physician wants you to follow-up in 4-6 months Dr [name]. 30 MINS APPT.  You will receive a reminder letter in the mail two months in advance. If you don't receive a letter, please call our office to schedule the follow-up appointment.

## 2014-04-10 ENCOUNTER — Encounter: Payer: Self-pay | Admitting: Cardiology

## 2014-04-10 DIAGNOSIS — R5381 Other malaise: Secondary | ICD-10-CM | POA: Insufficient documentation

## 2014-04-10 DIAGNOSIS — R5382 Chronic fatigue, unspecified: Secondary | ICD-10-CM

## 2014-04-10 NOTE — Assessment & Plan Note (Signed)
Within normal EF, and no sign of ischemia on a stress test, I cannot essentially exclude cardiac etiology for her fatigue. She is on minimal dose of beta blocker. We'll try to back off on the Ranexa as mentioned,but this is not a common side effect.   I wonder how much of this is psychological component as well.

## 2014-04-10 NOTE — Progress Notes (Signed)
PCP: [name], MD  Clinic Note: Chief Complaint  Patient presents with  . Follow-up    pt states that she doesn't have chest pain but more like "chest quivers". pt states that she does experience sob and swelling in her fingers. pt states that she has been very dizzy lately.   HPI: Michele [name] is a 58 y.o. female with a PMH below who presents today for 3 followup [facility] Horatio Pel on October 19 in follow-up from repeat hospital visit where she came in with left arm pain and numbness. 2 nuclear stress test which revealed an inferolateral infarct with no ischemia. There is mild inferior hypokinesis the EF is 62%. An echo showed EF of 65-70%.  She was placed back on that hand at that time. She then was readmitted on September 29 with chest pain and was taken the cardiac catheterization labwhere she had a cardiac catheterization which revealed severe stenosis in the stented portion of the distal circumflex/OM 3. This distribution would go along with the infarct noted on the Myoview, and based on the size of the downstream vessels, Dr. Martinique did not feel that this warranted further attempts at revascularization since it as already been shown to be problematic in the past. She was discharged on Ranexa plus Imdur.  During her last visit, she was complaining of feeling tired and was citing Ranexa as the cause. She now presents here for ~3-week followup.  Past Medical History  Diagnosis Date  . ANXIETY   . COPD     PFTs 07/2010 and 12/2011 - mod obstructive disease & decreased DLCO w/minimal response to bronchodilators & increased residual vol. consistent with air trapping   . DEPRESSION   . GERD   . DYSLIPIDEMIA   . SPONDYLOSIS, CERVICAL, WITH RADICULOPATHY   . Hiatal hernia   . CAD S/P percutaneous coronary angioplasty 10/2011, 11/2011; 11/20/2012    a) 5/'13: Inflat STEMI - PCI to Cx-OM; b) 6/'13: Staged PCI to mRCA, ~50% distal RCA lesion; c) Unstable  Angina 6/'14: RCA stent patent, ISR of dCx stent --> bifurcation PCI - new stent. d) Myoview ST 10/'13 & 11/'14: Inferolateral Scar, no ischemia;  e) Cath 02/2013: Patent Cx-OM3-AVg stents & RCA stent, mild dRCA & LAD disease -> cath 03/03/19/15: the medications OM stented segment now has severe restenosis within the small downstream OM3 & AV groove Cx-LPL --> felt to be too small for reasonable PCI outcome.  [name] History ST elevation myocardial infarction (STEMI) of inferolateral wall 10/2011    100% LCx-OM  -- PCI; Echo: EF 50-50%, inferolateral Hypokinesis.  . Tobacco abuse     Restarted smoking after initially quitting post-MI  . Borderline hypertension   . History of nuclear stress test 03/03/2012    bruce protocol myoview; large, mostly fixed inferolateral scar in LCx region; inferolateral akinesis; hypertensive response to exercise; target HR acheived; abnormal, but low risk   . Hypertension   . Anginal pain   . Chronic kidney disease     cyst on kidney  . Hepatitis     Prior Cardiac Evaluation and Past Surgical History: Past Surgical History  Procedure Laterality Date  . Coronary angioplasty with stent placement  10/10/11    Inferolateral STEMI: PCI of mid LCx; 2 overlapping Promus Element DES 2.5 mm x 12 mm ; 2.5 mm x 8 mm (postdilated with stent 2.75 mm) - distal stent extends into OM 3  . Coronary angioplasty with stent placement  11/06/11  Staged PCI of midRCA: Promus Element DES 2.5 mm x 24 mm- post-dilated to ~2.75-2.8 mm  . Coronary angioplasty with stent placement  11/19/2012    Significant distal ISR of stent in AV groove circumflex 2 OM 3: Bifurcation treatment with new stent placed from AV groove circumflex place across OM 3 (Promus Premier 2.5 mm x 12 mm postdilated to 2.65 mm; Cutting Balloon PTCA of stented ostial OM 3 with a 2.0 balloon:  . Doppler echocardiography  May 2013; Sept 2015    A. EF 50-55%, mild basal inferolateral hypokinesis.; b. EF 65-70% with no regional WMA.no  valvular lesions  . Nm myoview ltd  October 2013    Walk 9 minutes, 8 METS; no ischemia or infarction. The inferolateral scar, consistent with a Circumflex infarct   . Cpet  09/07/2012    wirh PFTs; peak VO2 69% predicted; impaired CV status - ischemic myocardial dysfunction; abrnomal pulm response - mild vent-perfusion mismatch with impaired pulm circulation; mod obstructive limitations (PFTs)   Interval History: she is no longer noting symptoms of chest tightness or pain, is now noticing what she calls little or burning sensations that have been at rest and with exertion. He is mostly complaining about being extremely fatigued. Apparently her PCP recently diagnosed with low B12 levels which could easily explain her fatigue. She is concerned that her neck says making her tired, and weak and dizzy. He also says her legs ache, but does not describe true claudication symptoms. She does not describe any heart failure symptoms of PND, orthopnea but does note mild pedal edema and hand swelling. No syncope near syncope, TIA/amaurosis fugax.   ROS: A comprehensive was performed. Review of Systems  Constitutional: Positive for malaise/fatigue. Negative for fever and chills.  HENT: Negative for nosebleeds.   Respiratory: Positive for cough, shortness of breath and wheezing. Negative for hemoptysis and sputum production.        Morning cough  Cardiovascular: Positive for leg swelling. Negative for claudication.  Gastrointestinal: Negative for blood in stool and melena.  Genitourinary: Negative for dysuria and hematuria.  Musculoskeletal: Positive for joint pain.  Neurological: Positive for dizziness and tingling.  Endo/Heme/Allergies: Does not bruise/bleed easily.  Psychiatric/Behavioral: Positive for depression and memory loss. The patient is nervous/anxious.   All other systems reviewed and are negative.   Current Outpatient Prescriptions on File Prior to Visit  Medication Sig Dispense Refill  .  Calcium Carbonate-Vitamin D (CALCIUM 600+D PO) Take 1 tablet by mouth 2 (two) times daily.     . carvedilol (COREG) 3.125 MG tablet Take 1 tablet (3.125 mg total) by mouth 2 (two) times daily with a meal. 60 tablet 11  . clonazePAM (KLONOPIN) 2 MG tablet Take 2 mg by mouth 3 (three) times daily as needed for anxiety.     . Coenzyme Q10 (COQ10) 100 MG CAPS Take 100 mg by mouth daily.     . isosorbide mononitrate (IMDUR) 30 MG 24 hr tablet Take 1 tablet (30 mg total) by mouth at bedtime. 90 tablet 3  . nitroGLYCERIN (NITROSTAT) 0.4 MG SL tablet Place 0.4 mg under the tongue every 5 (five) minutes as needed for chest pain.    . pantoprazole (PROTONIX) 40 MG tablet Take 40 mg by mouth daily.    . prasugrel (EFFIENT) 10 MG TABS tablet Take 1 tablet (10 mg total) by mouth daily. 30 tablet 11  . pravastatin (PRAVACHOL) 20 MG tablet Take 20 mg by mouth at bedtime.    . ranolazine (RANEXA)  500 MG 12 hr tablet Take 1 tablet (500 mg total) by mouth 2 (two) times daily. 60 tablet 11  . tiotropium (SPIRIVA) 18 MCG inhalation capsule Place 18 mcg into inhaler and inhale daily.     No current facility-administered medications on file prior to visit.   ALLERGIES REVIEWED IN EPIC -- no change SOCIAL AND FAMILY HISTORY REVIEWED IN EPIC -- no change; he continues to smoke  Wt Readings from Last 3 Encounters:  04/04/14 160 lb 4.8 oz (72.712 kg)  03/21/14 161 lb 3.2 oz (73.12 kg)  03/04/14 162 lb 1.6 oz (73.528 kg)    PHYSICAL EXAM BP 120/82 mmHg  Pulse 72  Ht 5\' 5"  (1.651 m)  Wt 160 lb 4.8 oz (72.712 kg)  BMI 26.68 kg/m2 General appearance: alert, cooperative, appears older than stated age, no distress; anxious questions appropriately. Is lying down and curled up on examination table Neck: no adenopathy, no carotid bruit and no JVD  Lungs: CTAB with the exception of mild diffuse crackles, normal percussion bilaterally and non-labored  Heart: RRR, S1, S2 normal, no murmur, click, rub or gallop;  nondisplaced PMI  Abdomen: soft, mild tenderness; bowel sounds normal; no masses, no organomegaly;  Extremities: extremities normal, atraumatic, no cyanosis, and no edema  Pulses: 2+ and symmetric;  Skin: Dry, leathery-appearing skin from long-term smoking  Neurologic: Mental status: Alert, oriented, thought content appropriate  Cranial nerves: normal (II-XII grossly intact)   Adult ECG Report  Rate: 72 ;  Rhythm: normal sinus rhythm and premature ventricular contractions (PVC)  Narrative Interpretation: Stable EKG  Recent Labs:    Lab Results  Component Value Date   CHOL 136 03/02/2014   HDL 27* 03/02/2014   LDLCALC 87 03/02/2014   LDLDIRECT 92.0 07/03/2011   TRIG 111 03/02/2014   CHOLHDL 5.0 03/02/2014    ASSESSMENT / PLAN: CAD -S/P MI-PCI AVG 5/13 then staged DES to RCA  11/06/11. ISR- PCI 11/19/12, cath 02/18/13- no ISR, + spasm, Myoview low risk Nov 2014 I am Not sure what to do here, because everything he tries she complains of side effects.  I reviewed her films, and I do agree that downstream circumflex vessels are relatively small in diameter now. There was severe progression of disease just in one year, most likely related to her continued smoking. At this point with there being infarct in that area with no evidence of ischemia noted on echocardiogram just a month and a half before the catheterization, and finally this area is essentially no longer viable and therefore not causing ischemia related angina either. Most of what I think her chest discomfort is probably from PVCs or from smoking-related spasm and microvascular disease.I will have her reduce the Ranexa to 500 mg in morning and take 1000 mg at night. That way if she does get sleepy, she is going to do anyway. She is on Imdur & is back on Effient without aspirin. On carvedilol @ low-dose. She is on a statin as well.   Coronary artery spasm, hx of On beta blocker and Imdur.  She had blood pressure room to use a  calcium channel blocker.  Dyslipidemia, goal LDL below 70 Labs look better in September on pravastatin. I suggested possibly using CoQ10 to help avoiding potential side effects. Hopefully her lipid panel continued to improve on statin. I hesitate to increase the dose or any medicine to avoid any other side effects.  Essential hypertension Probably does not have much in the way of hypertension since she is  on minimal dose of carvedilol and was unable tolerate amlodipine.  Tobacco abuse, ongoing I spent over10 minutes talking to herabout how important smoking cessation his. I stressed that the reason why her vessels are now almost headache in the circumflex distribution has a lot to do with her continued smoking. She did fairly well initially after MRI, but that is subsequently been almost an immovable object becomes a smoking cessation  Smoking cessation instruction/counseling given:  counseled patient on the dangers of tobacco use, advised patient to stop smoking, and reviewed strategies to maximize success; ~10-15 min  Chronic fatigue and malaise Within normal EF, and no sign of ischemia on a stress test, I cannot essentially exclude cardiac etiology for her fatigue. She is on minimal dose of beta blocker. We'll try to back off on the Ranexa as mentioned,but this is not a common side effect.   I wonder how much of this is psychological component as well.    Orders Placed This Encounter  Procedures  . EKG 12-Lead    Followup: 4-6 months   HARDING,DAVID W, M.D., M.S. Interventional Cardiologist   Pager # [phone]

## 2014-04-10 NOTE — Assessment & Plan Note (Signed)
Labs look better in September on pravastatin. I suggested possibly using CoQ10 to help avoiding potential side effects. Hopefully her lipid panel continued to improve on statin. I hesitate to increase the dose or any medicine to avoid any other side effects.

## 2014-04-10 NOTE — Assessment & Plan Note (Signed)
Probably does not have much in the way of hypertension since she is on minimal dose of carvedilol and was unable tolerate amlodipine.

## 2014-04-10 NOTE — Assessment & Plan Note (Signed)
I am Not sure what to do here, because everything he tries she complains of side effects.  I reviewed her films, and I do agree that downstream circumflex vessels are relatively small in diameter now. There was severe progression of disease just in one year, most likely related to her continued smoking. At this point with there being infarct in that area with no evidence of ischemia noted on echocardiogram just a month and a half before the catheterization, and finally this area is essentially no longer viable and therefore not causing ischemia related angina either. Most of what I think her chest discomfort is probably from PVCs or from smoking-related spasm and microvascular disease.I will have her reduce the Ranexa to 500 mg in morning and take 1000 mg at night. That way if she does get sleepy, she is going to do anyway. She is on Imdur & is back on Effient without aspirin. On carvedilol @ low-dose. She is on a statin as well.

## 2014-04-10 NOTE — Assessment & Plan Note (Signed)
On beta blocker and Imdur.  She had blood pressure room to use a calcium channel blocker.

## 2014-04-10 NOTE — Assessment & Plan Note (Signed)
I spent over10 minutes talking to herabout how important smoking cessation his. I stressed that the reason why her vessels are now almost headache in the circumflex distribution has a lot to do with her continued smoking. She did fairly well initially after MRI, but that is subsequently been almost an immovable object becomes a smoking cessation  Smoking cessation instruction/counseling given:  counseled patient on the dangers of tobacco use, advised patient to stop smoking, and reviewed strategies to maximize success; ~10-15 min

## 2014-04-11 NOTE — Addendum Note (Signed)
Addended by: Di Kindle D on: 04/11/2014 02:42 PM   Modules accepted: Orders

## 2014-05-12 ENCOUNTER — Encounter (HOSPITAL_COMMUNITY): Payer: Self-pay | Admitting: Cardiology
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
## 2014-05-28 ENCOUNTER — Telehealth: Payer: Self-pay | Admitting: Cardiology

## 2014-05-28 NOTE — Telephone Encounter (Signed)
Pt called with complaints of bleed on her eye, she has pressure on both eyes.  She is on effient but cannot take asa.  Her last stent was 11/2013.  I reassured her that this happens on occ. And it was not dangerous. but if she was concerned she should go to ER.

## 2014-05-29 IMAGING — US US ABDOMEN COMPLETE
1 series · 14 of 25 positions shown · non-contrast
Comparison: CT abdomen pelvis 09/28/2010.

CLINICAL DATA: Abdominal pain.

COMPLETE ABDOMINAL ULTRASOUND

[Series 1: us abdomen complete · 0.32mm/px · 14 of 81 slices shown]
[im 1/81]
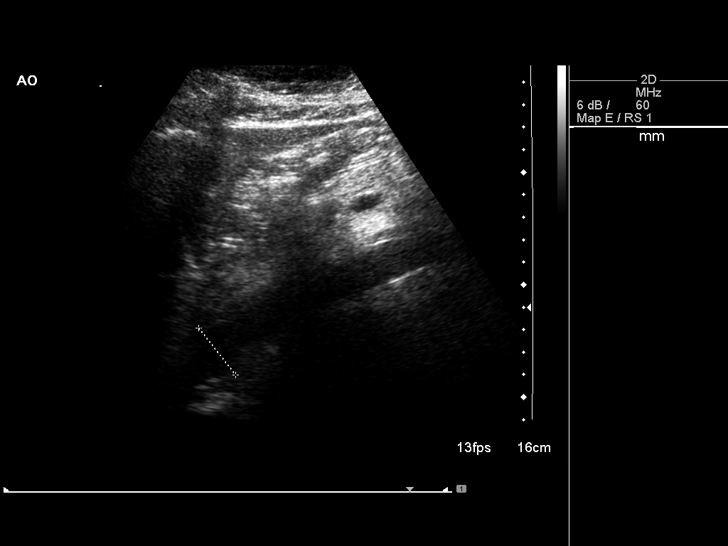
[im 7/81]
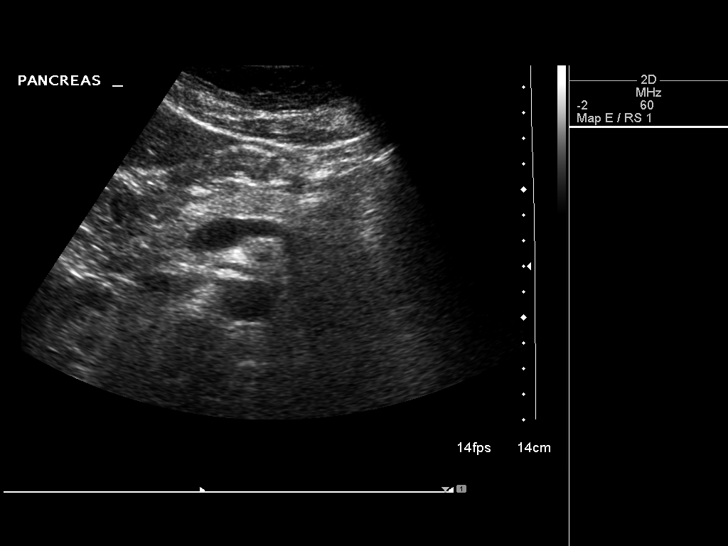
[im 14/81]
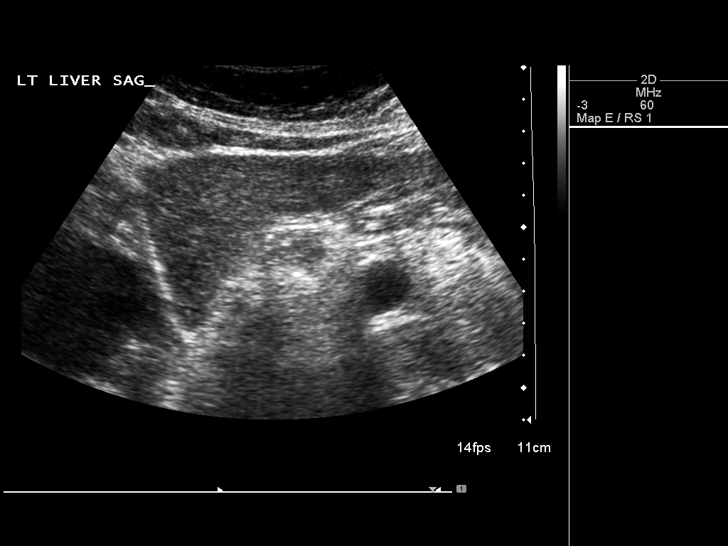
[im 21/81]
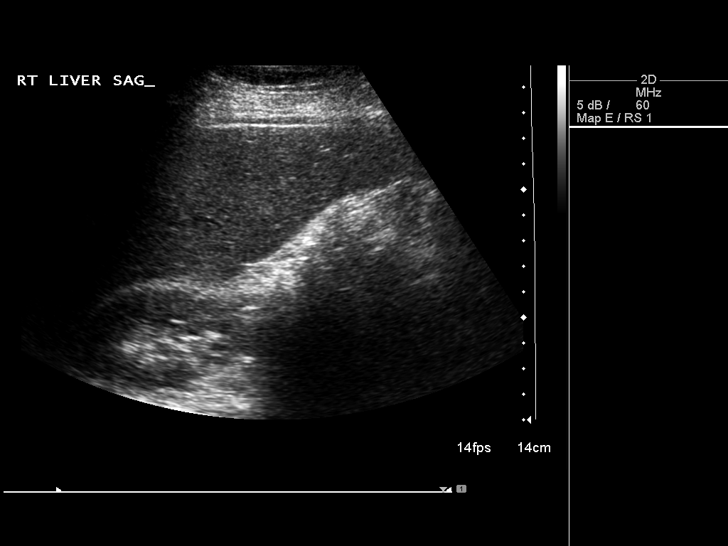
[im 27/81]
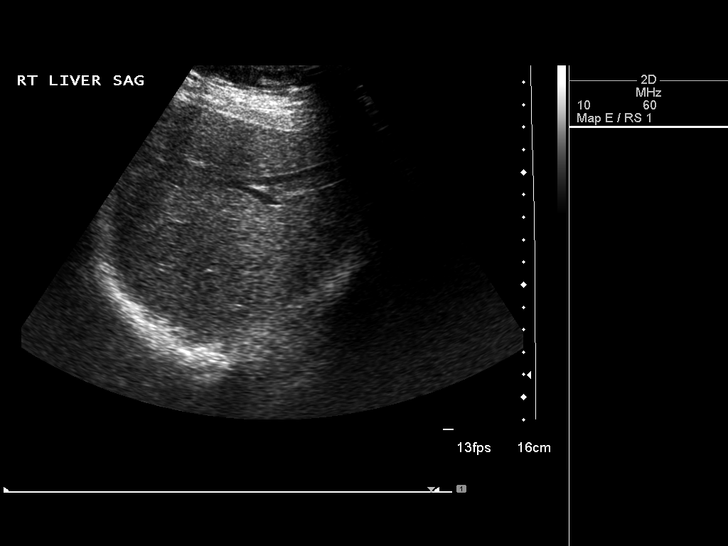
[im 31/81]
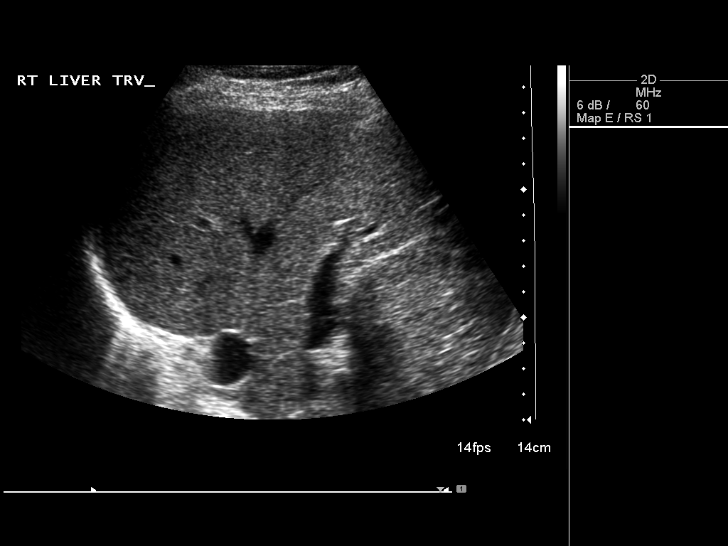
[im 37/81]
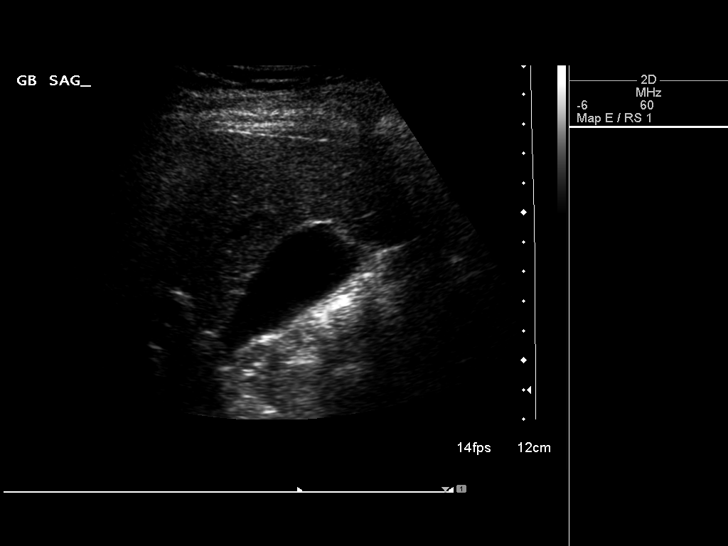
[im 44/81]
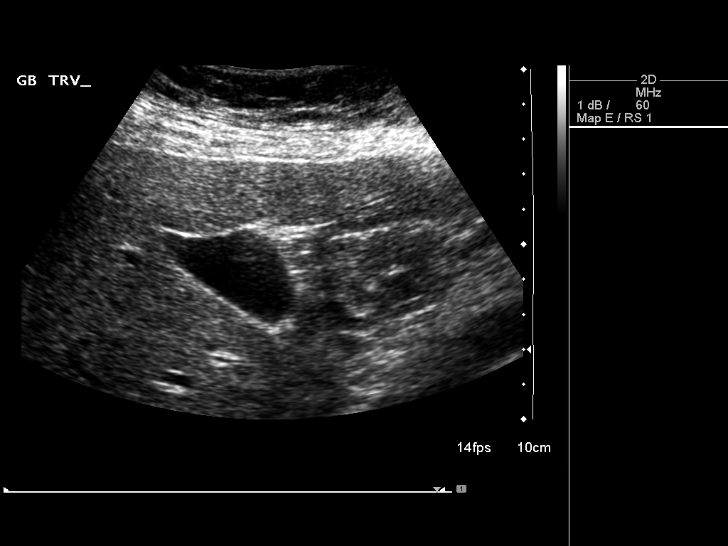
[im 51/81]
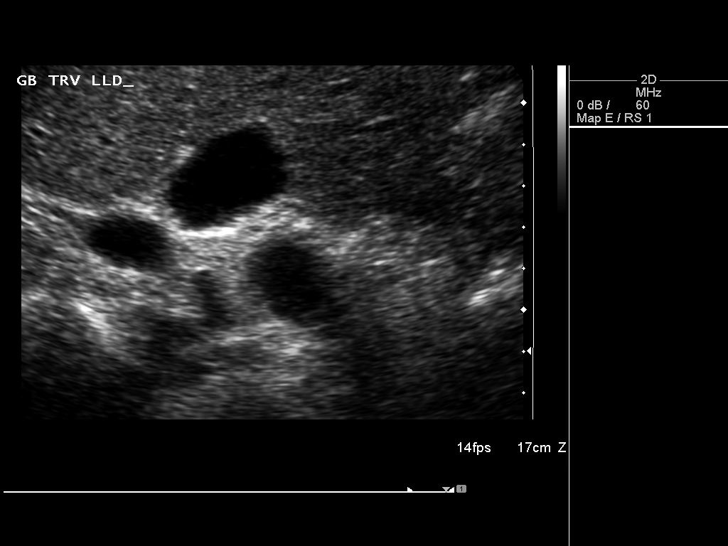
[im 54/81]
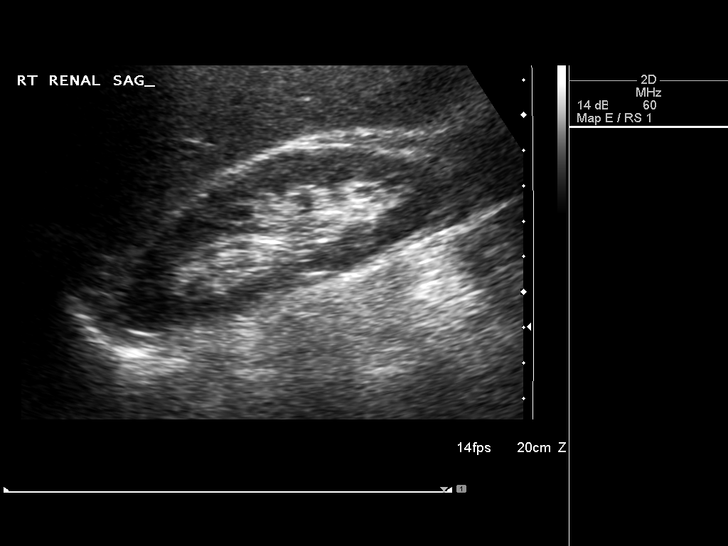
[im 61/81]
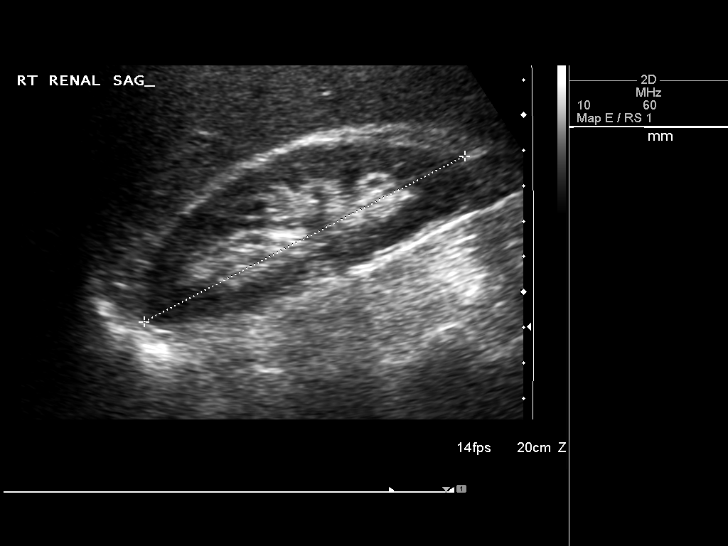
[im 67/81]
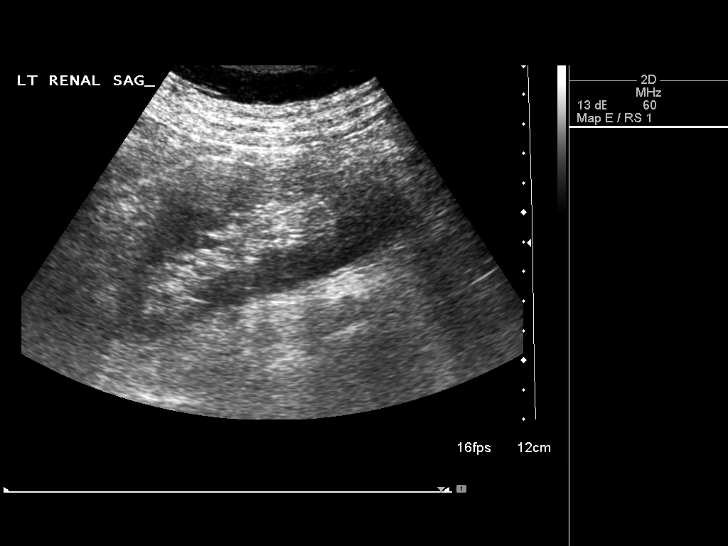
[im 74/81]
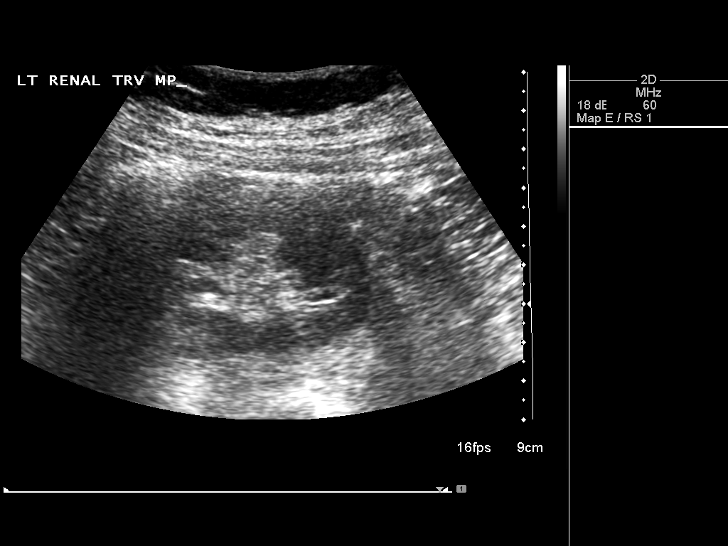
[im 81/81]
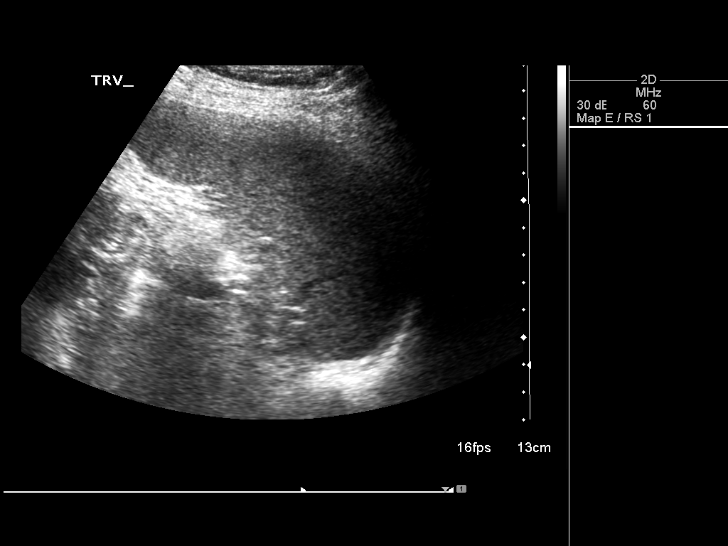

[14 of 25 positions shown; findings below may reference images not displayed]

FINDINGS: Gallbladder:  No gallstones, gallbladder wall thickening, or
pericholecystic fluid.

Common bile duct:  Measures 5 mm, within normal limits.

Liver:  No focal lesion identified.  Within normal limits in
parenchymal echogenicity.

IVC:  Appears normal.

Pancreas:  No focal abnormality seen.

Spleen:  Measures 8.0 cm, negative.

Right Kidney:  Measures 10.2 cm with normal parenchymal
echogenicity.  No hydronephrosis.

Left Kidney:  Measures 10.8 cm with normal parenchymal
echogenicity.  No hydronephrosis.

Abdominal aorta:  No aneurysm identified.
IMPRESSION: Negative abdominal ultrasound.

## 2014-05-31 DIAGNOSIS — H1132 Conjunctival hemorrhage, left eye: Secondary | ICD-10-CM | POA: Diagnosis not present

## 2014-06-15 DIAGNOSIS — J069 Acute upper respiratory infection, unspecified: Secondary | ICD-10-CM | POA: Diagnosis not present

## 2014-07-12 ENCOUNTER — Other Ambulatory Visit: Payer: Self-pay | Admitting: Cardiology

## 2014-07-13 ENCOUNTER — Telehealth: Payer: Self-pay | Admitting: Cardiology

## 2014-07-13 ENCOUNTER — Other Ambulatory Visit: Payer: Self-pay | Admitting: Cardiology

## 2014-07-13 MED ORDER — ICOSAPENT ETHYL 1 G PO CAPS: 1.0000 | ORAL_CAPSULE | Freq: Two times a day (BID) | ORAL | Status: DC

## 2014-07-13 NOTE — Telephone Encounter (Signed)
Called insurer to find out what they would cover. Neither brand nor generic Lovaza covered by [facility]. They recommended Vascepa and cited pt should have copay of $3.60.  Spoke w/ Dr. [name], he gave verbal OK to change med.   Called pt, she was agreeable to this. Sent to pharmacy of pt's preference.

## 2014-07-13 NOTE — Telephone Encounter (Signed)
Rx refill sent to patient pharmacy   

## 2014-07-13 NOTE — Telephone Encounter (Signed)
Pt insurance does no longer want to pay for Omega 3 Ethyl medicine. They say you could do a prior authorization or switch to another medicine.Please call her to let her know what is decided.

## 2014-07-14 DIAGNOSIS — H2513 Age-related nuclear cataract, bilateral: Secondary | ICD-10-CM | POA: Diagnosis not present

## 2014-07-14 DIAGNOSIS — H43393 Other vitreous opacities, bilateral: Secondary | ICD-10-CM | POA: Diagnosis not present

## 2014-07-14 DIAGNOSIS — H11441 Conjunctival cysts, right eye: Secondary | ICD-10-CM | POA: Diagnosis not present

## 2014-07-16 ENCOUNTER — Emergency Department (HOSPITAL_COMMUNITY)
Admission: EM | Admit: 2014-07-16 | Discharge: 2014-07-17 | Disposition: A | Discharge status: Home / Self Care | Payer: Medicare Other | Attending: Emergency Medicine | Admitting: Emergency Medicine

## 2014-07-16 ENCOUNTER — Encounter (HOSPITAL_COMMUNITY): Payer: Self-pay | Admitting: Nurse Practitioner

## 2014-07-16 ENCOUNTER — Emergency Department (HOSPITAL_COMMUNITY): Payer: Medicare Other

## 2014-07-16 DIAGNOSIS — S52102A Unspecified fracture of upper end of left radius, initial encounter for closed fracture: Secondary | ICD-10-CM | POA: Diagnosis not present

## 2014-07-16 DIAGNOSIS — Z7901 Long term (current) use of anticoagulants: Secondary | ICD-10-CM

## 2014-07-16 DIAGNOSIS — F329 Major depressive disorder, single episode, unspecified: Secondary | ICD-10-CM | POA: Insufficient documentation

## 2014-07-16 DIAGNOSIS — E559 Vitamin D deficiency, unspecified: Secondary | ICD-10-CM | POA: Insufficient documentation

## 2014-07-16 DIAGNOSIS — Z872 Personal history of diseases of the skin and subcutaneous tissue: Secondary | ICD-10-CM | POA: Insufficient documentation

## 2014-07-16 DIAGNOSIS — F419 Anxiety disorder, unspecified: Secondary | ICD-10-CM | POA: Insufficient documentation

## 2014-07-16 DIAGNOSIS — G8929 Other chronic pain: Secondary | ICD-10-CM

## 2014-07-16 DIAGNOSIS — S52122A Displaced fracture of head of left radius, initial encounter for closed fracture: Secondary | ICD-10-CM

## 2014-07-16 DIAGNOSIS — Z720 Tobacco use: Secondary | ICD-10-CM | POA: Diagnosis not present

## 2014-07-16 DIAGNOSIS — I25119 Atherosclerotic heart disease of native coronary artery with unspecified angina pectoris: Secondary | ICD-10-CM | POA: Diagnosis not present

## 2014-07-16 DIAGNOSIS — S3992XA Unspecified injury of lower back, initial encounter: Secondary | ICD-10-CM | POA: Insufficient documentation

## 2014-07-16 DIAGNOSIS — J449 Chronic obstructive pulmonary disease, unspecified: Secondary | ICD-10-CM | POA: Insufficient documentation

## 2014-07-16 DIAGNOSIS — S199XXA Unspecified injury of neck, initial encounter: Secondary | ICD-10-CM | POA: Insufficient documentation

## 2014-07-16 DIAGNOSIS — Y9351 Activity, roller skating (inline) and skateboarding: Secondary | ICD-10-CM | POA: Diagnosis not present

## 2014-07-16 DIAGNOSIS — R9431 Abnormal electrocardiogram [ECG] [EKG]: Secondary | ICD-10-CM | POA: Diagnosis not present

## 2014-07-16 DIAGNOSIS — N189 Chronic kidney disease, unspecified: Secondary | ICD-10-CM | POA: Insufficient documentation

## 2014-07-16 DIAGNOSIS — Y998 Other external cause status: Secondary | ICD-10-CM | POA: Diagnosis not present

## 2014-07-16 DIAGNOSIS — I252 Old myocardial infarction: Secondary | ICD-10-CM | POA: Insufficient documentation

## 2014-07-16 DIAGNOSIS — M533 Sacrococcygeal disorders, not elsewhere classified: Secondary | ICD-10-CM | POA: Diagnosis not present

## 2014-07-16 DIAGNOSIS — I129 Hypertensive chronic kidney disease with stage 1 through stage 4 chronic kidney disease, or unspecified chronic kidney disease: Secondary | ICD-10-CM | POA: Insufficient documentation

## 2014-07-16 DIAGNOSIS — S0990XA Unspecified injury of head, initial encounter: Secondary | ICD-10-CM | POA: Diagnosis not present

## 2014-07-16 DIAGNOSIS — M25512 Pain in left shoulder: Secondary | ICD-10-CM

## 2014-07-16 DIAGNOSIS — G2581 Restless legs syndrome: Secondary | ICD-10-CM | POA: Insufficient documentation

## 2014-07-16 DIAGNOSIS — M545 Low back pain, unspecified: Secondary | ICD-10-CM

## 2014-07-16 DIAGNOSIS — S6992XA Unspecified injury of left wrist, hand and finger(s), initial encounter: Secondary | ICD-10-CM | POA: Diagnosis present

## 2014-07-16 DIAGNOSIS — K219 Gastro-esophageal reflux disease without esophagitis: Secondary | ICD-10-CM | POA: Insufficient documentation

## 2014-07-16 DIAGNOSIS — Z79899 Other long term (current) drug therapy: Secondary | ICD-10-CM | POA: Insufficient documentation

## 2014-07-16 DIAGNOSIS — Z9861 Coronary angioplasty status: Secondary | ICD-10-CM | POA: Insufficient documentation

## 2014-07-16 DIAGNOSIS — Y92828 Other wilderness area as the place of occurrence of the external cause: Secondary | ICD-10-CM | POA: Insufficient documentation

## 2014-07-16 DIAGNOSIS — S299XXA Unspecified injury of thorax, initial encounter: Secondary | ICD-10-CM | POA: Diagnosis not present

## 2014-07-16 DIAGNOSIS — E785 Hyperlipidemia, unspecified: Secondary | ICD-10-CM | POA: Insufficient documentation

## 2014-07-16 DIAGNOSIS — W19XXXA Unspecified fall, initial encounter: Secondary | ICD-10-CM

## 2014-07-16 DIAGNOSIS — R52 Pain, unspecified: Secondary | ICD-10-CM

## 2014-07-16 DIAGNOSIS — S4992XA Unspecified injury of left shoulder and upper arm, initial encounter: Secondary | ICD-10-CM | POA: Diagnosis not present

## 2014-07-16 DIAGNOSIS — Z9889 Other specified postprocedural states: Secondary | ICD-10-CM | POA: Insufficient documentation

## 2014-07-16 DIAGNOSIS — S52124A Nondisplaced fracture of head of right radius, initial encounter for closed fracture: Secondary | ICD-10-CM | POA: Diagnosis not present

## 2014-07-16 LAB — CBC
HCT: 45.5 % (ref 36.0–46.0)
Hemoglobin: 15.1 g/dL — ABNORMAL HIGH (ref 12.0–15.0)
MCH: 29.8 pg (ref 26.0–34.0)
MCHC: 33.2 g/dL (ref 30.0–36.0)
MCV: 89.7 fL (ref 78.0–100.0)
Platelets: 176 10*3/uL (ref 150–400)
RBC: 5.07 MIL/uL (ref 3.87–5.11)
RDW: 13.3 % (ref 11.5–15.5)
WBC: 8.3 10*3/uL (ref 4.0–10.5)

## 2014-07-16 LAB — BASIC METABOLIC PANEL
Anion gap: 7 (ref 5–15)
BUN: 12 mg/dL (ref 6–23)
CO2: 27 mmol/L (ref 19–32)
Calcium: 9.3 mg/dL (ref 8.4–10.5)
Chloride: 106 mmol/L (ref 96–112)
Creatinine, Ser: 0.74 mg/dL (ref 0.50–1.10)
GFR calc Af Amer: >90 mL/min (ref >90)
GFR calc non Af Amer: >90 mL/min (ref >90)
Glucose, Bld: 101 mg/dL — ABNORMAL HIGH (ref 70–99)
Potassium: 4.4 mmol/L (ref 3.5–5.1)
Sodium: 140 mmol/L (ref 135–145)

## 2014-07-16 LAB — TROPONIN I: Troponin I: <0.03 ng/mL (ref <0.031)

## 2014-07-16 MED ORDER — MORPHINE SULFATE 4 MG/ML IJ SOLN
4.0000 mg | Freq: Once | INTRAMUSCULAR | Status: AC
  Administered 2014-07-16: 4 mg via INTRAVENOUS
  Filled 2014-07-16: qty 1

## 2014-07-16 MED ORDER — HYDROCODONE-ACETAMINOPHEN 5-325 MG PO TABS: 1.0000 | ORAL_TABLET | Freq: Four times a day (QID) | ORAL | Status: DC | PRN

## 2014-07-16 MED ORDER — MORPHINE SULFATE 4 MG/ML IJ SOLN
4.0000 mg | Freq: Once | INTRAMUSCULAR | Status: AC
  Administered 2014-07-17: 4 mg via INTRAVENOUS
  Filled 2014-07-16: qty 1

## 2014-07-16 NOTE — Discharge Instructions (Signed)
1. Medications: vicodin, usual home medications 2. Treatment: rest, drink plenty of fluids, keep splint dry and in the sling 3. Follow Up: Please followup with Orthopedics in 3 days for discussion of your diagnoses and further evaluation after today's visit;

## 2014-07-16 NOTE — Progress Notes (Signed)
Orthopedic Tech Progress Note Patient Details:  [facility] [birth year] [phone] Applied fiberglass long arm splint to LUE.  Pulses, sensation, motion intact before and after splinting.  Capillary refill less than 2 seconds before and after splinting.  Left arm sling with pt.'s nurse due to pt. being transported to radiology for follow-up x-rays . Ortho Devices Type of Ortho Device: Post (long arm) splint, Arm sling Ortho Device/Splint Location: LUE Ortho Device/Splint Interventions: Application   Darrol Poke 07/16/2014, 9:16 PM
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
## 2014-07-16 NOTE — ED Notes (Signed)
She was roller skating and fell. She hit her head but denies LOC. She c/o severe pain in her L arm and lower back since. She is concerned because she takes blood thinner. She is A&ox4, ambulatory, mae

## 2014-07-17 DIAGNOSIS — S52122A Displaced fracture of head of left radius, initial encounter for closed fracture: Secondary | ICD-10-CM | POA: Diagnosis not present

## 2014-07-17 NOTE — ED Notes (Signed)
Patient is alert and orientedx4.  Patient was explained discharge instructions and they understood them with no questions.  The patient's husband, Francia Greaves is taking the patient home.

## 2014-07-20 DIAGNOSIS — S52125A Nondisplaced fracture of head of left radius, initial encounter for closed fracture: Secondary | ICD-10-CM | POA: Diagnosis not present

## 2014-08-09 ENCOUNTER — Ambulatory Visit (INDEPENDENT_AMBULATORY_CARE_PROVIDER_SITE_OTHER): Payer: Medicare Other | Admitting: Cardiology

## 2014-08-09 ENCOUNTER — Encounter: Payer: Self-pay | Admitting: Cardiology

## 2014-08-09 VITALS — BP 140/68 | HR 58 | Ht 65.0 in | Wt 163.9 lb

## 2014-08-09 DIAGNOSIS — R5381 Other malaise: Secondary | ICD-10-CM

## 2014-08-09 DIAGNOSIS — I201 Angina pectoris with documented spasm: Secondary | ICD-10-CM

## 2014-08-09 DIAGNOSIS — I2510 Atherosclerotic heart disease of native coronary artery without angina pectoris: Secondary | ICD-10-CM | POA: Diagnosis not present

## 2014-08-09 DIAGNOSIS — Z9861 Coronary angioplasty status: Secondary | ICD-10-CM | POA: Diagnosis not present

## 2014-08-09 DIAGNOSIS — R5382 Chronic fatigue, unspecified: Secondary | ICD-10-CM | POA: Diagnosis not present

## 2014-08-09 DIAGNOSIS — I10 Essential (primary) hypertension: Secondary | ICD-10-CM | POA: Diagnosis not present

## 2014-08-09 DIAGNOSIS — Z79899 Other long term (current) drug therapy: Secondary | ICD-10-CM

## 2014-08-09 DIAGNOSIS — Z720 Tobacco use: Secondary | ICD-10-CM

## 2014-08-09 DIAGNOSIS — E785 Hyperlipidemia, unspecified: Secondary | ICD-10-CM | POA: Diagnosis not present

## 2014-08-09 NOTE — Patient Instructions (Signed)
Your physician recommends that you return for lab work FASTING  Your physician recommends that you schedule a follow-up appointment in: 6 months with Dr.Harding

## 2014-08-10 DIAGNOSIS — S52125D Nondisplaced fracture of head of left radius, subsequent encounter for closed fracture with routine healing: Secondary | ICD-10-CM | POA: Diagnosis not present

## 2014-08-10 DIAGNOSIS — M545 Low back pain: Secondary | ICD-10-CM | POA: Diagnosis not present

## 2014-08-10 DIAGNOSIS — M25512 Pain in left shoulder: Secondary | ICD-10-CM | POA: Diagnosis not present

## 2014-08-10 DIAGNOSIS — R81 Glycosuria: Secondary | ICD-10-CM | POA: Diagnosis not present

## 2014-08-10 DIAGNOSIS — R3989 Other symptoms and signs involving the genitourinary system: Secondary | ICD-10-CM | POA: Diagnosis not present
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
## 2014-08-11 NOTE — Assessment & Plan Note (Signed)
Does have chronic mild stable angina. She clearly has an etiology for it.  I tend to agree with Dr. Martinique at recurrent revascularization in circumflex-OM-AV groove circumflex distribution is probably not likely to stay patent long based on how small vessels are normal date [place] on her MRI. I think she probably just has poor distal runoff with chronic diffuse coronary disease since he does not stop smoking.  Plan: Continue with Ranexa and Imdur along with low-dose beta blocker. Simply based on the extent of her CAD with stents in the RCA as well, I would like to keep her on an antiplatelet agent. She is on Effient because of poor responsiveness to Plavix. She is not having any major bleeding issues. If she were to have issues I think we probably safely switch her back to Plavix the further out from her PCI we get.  We'll also continue her statin to the extent that she is taking it.

## 2014-08-11 NOTE — Assessment & Plan Note (Signed)
As is usually case we spent at least a few minutes talking about smoking cessation. I explained to her that the main reason that her stents shutdown is because of her continued smoking. I expressed to her my concern that she would potentially truly developed PAD and carotid disease.  Smoking cessation instruction/counseling given:  counseled patient on the dangers of tobacco use, advised patient to stop smoking, and reviewed strategies to maximize success

## 2014-08-11 NOTE — Assessment & Plan Note (Signed)
She has normally and stable significant CAD but that was regardless of her having these symptoms for the last couple years. She didn't really get any better by backing off the Robaxin so I will continue that. I really think that she probably is suffering from depression and is manifesting in with multiple somatic symptoms and insomnia with poor sleep and fatigue.

## 2014-08-11 NOTE — Assessment & Plan Note (Signed)
Her labs were better in September indicating that she may very well be taking her pravastatin. Will recheck lipid panel and chemistry panel to follow her trend.

## 2014-08-11 NOTE — Assessment & Plan Note (Addendum)
Usually well controlled without much medication. She is on relatively low dose of carvedilol. As noted above if she were to require more blood pressure control with a heart rate in the 50s, I would probably think about using an ARB. She didn't tolerate amlodipine in the past

## 2014-08-11 NOTE — Assessment & Plan Note (Signed)
As a chronic smoker she is at risk for coronary spasm. Plan: Continue with current dose of Imdur. If her blood pressure will allow him in the future we could consider adding a calcium channel blocker. Usually her blood pressure has been well-controlled.  Will monitor.I think her pressure is elevated today because of arm pain.

## 2014-08-23 DIAGNOSIS — E785 Hyperlipidemia, unspecified: Secondary | ICD-10-CM | POA: Diagnosis not present

## 2014-08-23 DIAGNOSIS — Z79899 Other long term (current) drug therapy: Secondary | ICD-10-CM | POA: Diagnosis not present

## 2014-08-24 LAB — COMPREHENSIVE METABOLIC PANEL
ALT: 11 U/L (ref 0–35)
AST: 18 U/L (ref 0–37)
Albumin: 4.1 g/dL (ref 3.5–5.2)
Alkaline Phosphatase: 66 U/L (ref 39–117)
BUN: 14 mg/dL (ref 6–23)
CO2: 24 mEq/L (ref 19–32)
Calcium: 9.4 mg/dL (ref 8.4–10.5)
Chloride: 106 mEq/L (ref 96–112)
Creat: 0.77 mg/dL (ref 0.50–1.10)
Glucose, Bld: 114 mg/dL — ABNORMAL HIGH (ref 70–99)
Potassium: 4.2 mEq/L (ref 3.5–5.3)
Sodium: 142 mEq/L (ref 135–145)
Total Bilirubin: 0.5 mg/dL (ref 0.2–1.2)
Total Protein: 6.6 g/dL (ref 6.0–8.3)

## 2014-08-24 LAB — LIPID PANEL
Cholesterol: 176 mg/dL (ref 0–200)
HDL: 32 mg/dL — ABNORMAL LOW (ref >=46)
LDL Cholesterol: 118 mg/dL — ABNORMAL HIGH (ref 0–99)
Total CHOL/HDL Ratio: 5.5 Ratio
Triglycerides: 132 mg/dL (ref <150)
VLDL: 26 mg/dL (ref 0–40)

## 2014-08-29 ENCOUNTER — Other Ambulatory Visit: Payer: Self-pay | Admitting: Cardiology

## 2014-08-29 ENCOUNTER — Telehealth: Payer: Self-pay | Admitting: Cardiology

## 2014-08-29 NOTE — Telephone Encounter (Signed)
Rx has been sent to the pharmacy electronically. ° °

## 2014-08-29 NOTE — Telephone Encounter (Signed)
Pt would like her lab results from last week please. °

## 2014-08-29 NOTE — Telephone Encounter (Signed)
Explained lab results to pt, she voiced understanding.

## 2014-09-06 ENCOUNTER — Telehealth: Payer: Self-pay | Admitting: *Deleted

## 2014-09-06 ENCOUNTER — Encounter: Payer: Self-pay | Admitting: *Deleted

## 2014-09-06 NOTE — Telephone Encounter (Signed)
-----   Message from Leonie Man, MD sent at 09/05/2014  5:58 PM EDT ----- Arloa Koh see if she can tolerate increasing Pravastatin to 40 mg - at least every other day   [facility]

## 2014-09-06 NOTE — Telephone Encounter (Signed)
Spoke to patient. Information given. Patient will alternate 20/ 40 pravastatin every other day.  she will call back in few weeks to see if she can tolerate medications  Will change medications prescription. Mail results.

## 2014-09-06 NOTE — Telephone Encounter (Signed)
Returning your call. °

## 2014-09-06 NOTE — Telephone Encounter (Signed)
Left message to call back in regards to lab results

## 2014-09-07 DIAGNOSIS — S52125D Nondisplaced fracture of head of left radius, subsequent encounter for closed fracture with routine healing: Secondary | ICD-10-CM | POA: Diagnosis not present

## 2014-09-12 ENCOUNTER — Telehealth: Payer: Self-pay | Admitting: Cardiology

## 2014-09-12 NOTE — Telephone Encounter (Signed)
Spoke with pt, her pravastatin was increased to 40/20 mg. Since the increase she is having a burning sensation that start in her stomach and goes around her body. She has an appointment with her primary MD tomorrow. Instructed patient to decrease the pravastatin back to her previous dose and let us know what the PCP thinks. Pt agreed with this plan.

## 2014-09-12 NOTE — Telephone Encounter (Signed)
Pt is burning around her back,stomach and sides,she wonder if it might be the Pravastatin.Her dose was increased every other day.

## 2014-09-13 ENCOUNTER — Other Ambulatory Visit: Payer: Self-pay | Admitting: Family Medicine

## 2014-09-13 DIAGNOSIS — Z205 Contact with and (suspected) exposure to viral hepatitis: Secondary | ICD-10-CM | POA: Diagnosis not present

## 2014-09-13 DIAGNOSIS — E538 Deficiency of other specified B group vitamins: Secondary | ICD-10-CM | POA: Diagnosis not present

## 2014-09-13 DIAGNOSIS — R110 Nausea: Secondary | ICD-10-CM

## 2014-09-13 DIAGNOSIS — R5383 Other fatigue: Secondary | ICD-10-CM | POA: Diagnosis not present

## 2014-09-13 DIAGNOSIS — R109 Unspecified abdominal pain: Secondary | ICD-10-CM | POA: Diagnosis not present

## 2014-09-13 DIAGNOSIS — R634 Abnormal weight loss: Secondary | ICD-10-CM | POA: Diagnosis not present

## 2014-09-13 DIAGNOSIS — R1084 Generalized abdominal pain: Secondary | ICD-10-CM

## 2014-09-13 DIAGNOSIS — R739 Hyperglycemia, unspecified: Secondary | ICD-10-CM | POA: Diagnosis not present

## 2014-09-15 ENCOUNTER — Telehealth: Payer: Self-pay | Admitting: Gastroenterology

## 2014-09-15 ENCOUNTER — Ambulatory Visit
Admission: RE | Admit: 2014-09-15 | Discharge: 2014-09-15 | Disposition: A | Discharge status: Home / Self Care | Payer: Medicare Other | Source: Ambulatory Visit | Attending: Family Medicine | Admitting: Family Medicine

## 2014-09-15 DIAGNOSIS — R1011 Right upper quadrant pain: Secondary | ICD-10-CM | POA: Diagnosis not present

## 2014-09-15 DIAGNOSIS — R110 Nausea: Secondary | ICD-10-CM

## 2014-09-15 DIAGNOSIS — R1084 Generalized abdominal pain: Secondary | ICD-10-CM

## 2014-09-15 NOTE — Telephone Encounter (Signed)
Per Dr Ardis Hughs last note he states that he does not feel the complaints are GI related.  I explained to the pt and she then stated that her PCP ordered a CT scan that she had done today.  I advised her to call the ordering MD and let them give her the results and if they want her seen by GI they will call and refer her back.  Pt agreed and will have PCP refer back if that is what they feel is needed.

## 2014-09-21 DIAGNOSIS — R1084 Generalized abdominal pain: Secondary | ICD-10-CM | POA: Diagnosis not present

## 2014-09-28 ENCOUNTER — Other Ambulatory Visit: Payer: Self-pay | Admitting: Cardiology

## 2014-09-28 NOTE — Telephone Encounter (Signed)
Clonazepam refill refused - defer to PCP

## 2014-10-05 ENCOUNTER — Other Ambulatory Visit: Payer: Self-pay | Admitting: Cardiology

## 2014-10-05 NOTE — Telephone Encounter (Signed)
Rx(s) sent to pharmacy electronically.  

## 2014-10-19 DIAGNOSIS — R3989 Other symptoms and signs involving the genitourinary system: Secondary | ICD-10-CM | POA: Diagnosis not present

## 2014-10-19 DIAGNOSIS — R300 Dysuria: Secondary | ICD-10-CM | POA: Diagnosis not present

## 2014-10-19 DIAGNOSIS — N3020 Other chronic cystitis without hematuria: Secondary | ICD-10-CM | POA: Diagnosis not present

## 2014-10-19 DIAGNOSIS — M545 Low back pain: Secondary | ICD-10-CM | POA: Diagnosis not present

## 2014-10-27 ENCOUNTER — Telehealth: Payer: Self-pay | Admitting: Cardiology

## 2014-10-27 DIAGNOSIS — R109 Unspecified abdominal pain: Secondary | ICD-10-CM | POA: Diagnosis not present

## 2014-10-27 DIAGNOSIS — R198 Other specified symptoms and signs involving the digestive system and abdomen: Secondary | ICD-10-CM | POA: Diagnosis not present

## 2014-10-27 NOTE — Telephone Encounter (Signed)
See patient inquiry. I note there's probably a small bleed risk w/ cystoscopy but this is scheduled for tomorrow, not sure if discontinuing her Effient a day before would make any difference in outcome.

## 2014-10-27 NOTE — Telephone Encounter (Signed)
Ms. Kobus is having a procedure done on tomorrow (Cystoscopy) and is not sure she should come off of her Effient . The Urologist (PA) told her she didn't need to , but she wants to be sure . Please call    Thanks

## 2014-10-27 NOTE — Telephone Encounter (Signed)
Pt advised of physician instructions. She voiced understanding.

## 2014-10-27 NOTE — Telephone Encounter (Signed)
I wouldn't worry about holding Effient now for cystoscopy.  Peter Martinique MD, [facility]

## 2014-10-28 DIAGNOSIS — N3020 Other chronic cystitis without hematuria: Secondary | ICD-10-CM | POA: Diagnosis not present

## 2014-11-04 ENCOUNTER — Encounter (HOSPITAL_COMMUNITY): Payer: Self-pay | Admitting: Emergency Medicine

## 2014-11-04 ENCOUNTER — Emergency Department (HOSPITAL_COMMUNITY)
Admission: EM | Admit: 2014-11-04 | Discharge: 2014-11-05 | Disposition: A | Discharge status: Home / Self Care | Payer: Medicare Other | Attending: Emergency Medicine | Admitting: Emergency Medicine

## 2014-11-04 ENCOUNTER — Emergency Department (HOSPITAL_COMMUNITY): Payer: Medicare Other

## 2014-11-04 DIAGNOSIS — E785 Hyperlipidemia, unspecified: Secondary | ICD-10-CM | POA: Insufficient documentation

## 2014-11-04 DIAGNOSIS — N189 Chronic kidney disease, unspecified: Secondary | ICD-10-CM | POA: Diagnosis not present

## 2014-11-04 DIAGNOSIS — Z720 Tobacco use: Secondary | ICD-10-CM | POA: Insufficient documentation

## 2014-11-04 DIAGNOSIS — G4700 Insomnia, unspecified: Secondary | ICD-10-CM | POA: Diagnosis not present

## 2014-11-04 DIAGNOSIS — R0602 Shortness of breath: Secondary | ICD-10-CM | POA: Diagnosis not present

## 2014-11-04 DIAGNOSIS — I252 Old myocardial infarction: Secondary | ICD-10-CM | POA: Insufficient documentation

## 2014-11-04 DIAGNOSIS — R1031 Right lower quadrant pain: Secondary | ICD-10-CM | POA: Insufficient documentation

## 2014-11-04 DIAGNOSIS — K2970 Gastritis, unspecified, without bleeding: Secondary | ICD-10-CM | POA: Diagnosis not present

## 2014-11-04 DIAGNOSIS — R079 Chest pain, unspecified: Secondary | ICD-10-CM | POA: Diagnosis not present

## 2014-11-04 DIAGNOSIS — F329 Major depressive disorder, single episode, unspecified: Secondary | ICD-10-CM | POA: Insufficient documentation

## 2014-11-04 DIAGNOSIS — R110 Nausea: Secondary | ICD-10-CM | POA: Diagnosis not present

## 2014-11-04 DIAGNOSIS — J441 Chronic obstructive pulmonary disease with (acute) exacerbation: Secondary | ICD-10-CM | POA: Diagnosis not present

## 2014-11-04 DIAGNOSIS — R51 Headache: Secondary | ICD-10-CM | POA: Insufficient documentation

## 2014-11-04 DIAGNOSIS — I25119 Atherosclerotic heart disease of native coronary artery with unspecified angina pectoris: Secondary | ICD-10-CM | POA: Diagnosis not present

## 2014-11-04 DIAGNOSIS — Z872 Personal history of diseases of the skin and subcutaneous tissue: Secondary | ICD-10-CM | POA: Diagnosis not present

## 2014-11-04 DIAGNOSIS — Z86018 Personal history of other benign neoplasm: Secondary | ICD-10-CM | POA: Diagnosis not present

## 2014-11-04 DIAGNOSIS — Z79899 Other long term (current) drug therapy: Secondary | ICD-10-CM | POA: Insufficient documentation

## 2014-11-04 DIAGNOSIS — R0789 Other chest pain: Secondary | ICD-10-CM | POA: Diagnosis not present

## 2014-11-04 DIAGNOSIS — K219 Gastro-esophageal reflux disease without esophagitis: Secondary | ICD-10-CM | POA: Diagnosis not present

## 2014-11-04 DIAGNOSIS — G8929 Other chronic pain: Secondary | ICD-10-CM | POA: Diagnosis not present

## 2014-11-04 DIAGNOSIS — Z8619 Personal history of other infectious and parasitic diseases: Secondary | ICD-10-CM | POA: Diagnosis not present

## 2014-11-04 DIAGNOSIS — I129 Hypertensive chronic kidney disease with stage 1 through stage 4 chronic kidney disease, or unspecified chronic kidney disease: Secondary | ICD-10-CM | POA: Insufficient documentation

## 2014-11-04 DIAGNOSIS — K5900 Constipation, unspecified: Secondary | ICD-10-CM | POA: Insufficient documentation

## 2014-11-04 DIAGNOSIS — F419 Anxiety disorder, unspecified: Secondary | ICD-10-CM | POA: Insufficient documentation

## 2014-11-04 DIAGNOSIS — R109 Unspecified abdominal pain: Secondary | ICD-10-CM

## 2014-11-04 DIAGNOSIS — R10813 Right lower quadrant abdominal tenderness: Secondary | ICD-10-CM | POA: Diagnosis not present

## 2014-11-04 HISTORY — DX: Systemic involvement of connective tissue, unspecified: M35.9

## 2014-11-04 LAB — URINALYSIS, ROUTINE W REFLEX MICROSCOPIC
Bilirubin Urine: NEGATIVE
Glucose, UA: NEGATIVE mg/dL
Hgb urine dipstick: NEGATIVE
Ketones, ur: NEGATIVE mg/dL
Leukocytes, UA: NEGATIVE
Nitrite: NEGATIVE
Protein, ur: NEGATIVE mg/dL
Specific Gravity, Urine: 1.012 (ref 1.005–1.030)
Urobilinogen, UA: 0.2 mg/dL (ref 0.0–1.0)
pH: 5.5 (ref 5.0–8.0)

## 2014-11-04 LAB — CBC
HCT: 41.3 % (ref 36.0–46.0)
Hemoglobin: 13.8 g/dL (ref 12.0–15.0)
MCH: 29.8 pg (ref 26.0–34.0)
MCHC: 33.4 g/dL (ref 30.0–36.0)
MCV: 89.2 fL (ref 78.0–100.0)
Platelets: 170 10*3/uL (ref 150–400)
RBC: 4.63 MIL/uL (ref 3.87–5.11)
RDW: 13.7 % (ref 11.5–15.5)
WBC: 5.2 10*3/uL (ref 4.0–10.5)

## 2014-11-04 LAB — I-STAT TROPONIN, ED: Troponin i, poc: 0 ng/mL (ref 0.00–0.08)

## 2014-11-04 LAB — BASIC METABOLIC PANEL
Anion gap: 7 (ref 5–15)
BUN: 8 mg/dL (ref 6–20)
CO2: 27 mmol/L (ref 22–32)
Calcium: 9.2 mg/dL (ref 8.9–10.3)
Chloride: 107 mmol/L (ref 101–111)
Creatinine, Ser: 0.76 mg/dL (ref 0.44–1.00)
GFR calc Af Amer: >60 mL/min (ref >60)
GFR calc non Af Amer: >60 mL/min (ref >60)
Glucose, Bld: 96 mg/dL (ref 65–99)
Potassium: 4 mmol/L (ref 3.5–5.1)
Sodium: 141 mmol/L (ref 135–145)

## 2014-11-04 MED ORDER — IOHEXOL 300 MG/ML  SOLN
25.0000 mL | Freq: Once | INTRAMUSCULAR | Status: AC | PRN
  Administered 2014-11-04: 25 mL via ORAL

## 2014-11-04 MED ORDER — MORPHINE SULFATE 4 MG/ML IJ SOLN
4.0000 mg | Freq: Once | INTRAMUSCULAR | Status: AC
  Administered 2014-11-05: 4 mg via INTRAVENOUS
  Filled 2014-11-04: qty 1

## 2014-11-04 MED ORDER — ONDANSETRON HCL 4 MG/2ML IJ SOLN
4.0000 mg | Freq: Once | INTRAMUSCULAR | Status: AC
  Administered 2014-11-04: 4 mg via INTRAVENOUS
  Filled 2014-11-04: qty 2

## 2014-11-04 MED ORDER — IOHEXOL 300 MG/ML  SOLN
100.0000 mL | Freq: Once | INTRAMUSCULAR | Status: AC | PRN
  Administered 2014-11-04: 100 mL via INTRAVENOUS

## 2014-11-04 MED ORDER — MORPHINE SULFATE 4 MG/ML IJ SOLN
4.0000 mg | Freq: Once | INTRAMUSCULAR | Status: AC
  Administered 2014-11-04: 4 mg via INTRAVENOUS
  Filled 2014-11-04: qty 1
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
## 2014-11-04 NOTE — ED Notes (Signed)
Pt continues to call out for pain medication.

## 2014-11-04 NOTE — ED Notes (Signed)
Per EMS:  Pt in with multiple complaints.  Pt is having lower left chest pain that feels achy.  Pt sts she has had numbness/tingling in her left arm and lips for a few days.  Pt also having abdominal pain, which has been intermittent for approx 1 year with GI and OB specialists unable to diagnose anything specific.  Pt c/o headache as well.  Vitals stable during transport, tender to RLQ.  Pt was seen approx 9 days ago for constipation and sts she has not had a lot of movement since then (approx 2-3).

## 2014-11-04 NOTE — ED Notes (Signed)
MD aware of pt's pain and request for medication

## 2014-11-04 NOTE — ED Notes (Signed)
Spoke to [place] in lab.  Will add additional labs.

## 2014-11-05 DIAGNOSIS — R079 Chest pain, unspecified: Secondary | ICD-10-CM | POA: Diagnosis not present

## 2014-11-05 LAB — TROPONIN I: Troponin I: <0.03 ng/mL (ref <0.031)

## 2014-11-05 LAB — HEPATIC FUNCTION PANEL
ALT: 11 U/L — ABNORMAL LOW (ref 14–54)
AST: 23 U/L (ref 15–41)
Albumin: 3.5 g/dL (ref 3.5–5.0)
Alkaline Phosphatase: 50 U/L (ref 38–126)
Bilirubin, Direct: 0.1 mg/dL (ref 0.1–0.5)
Indirect Bilirubin: 0.5 mg/dL (ref 0.3–0.9)
Total Bilirubin: 0.6 mg/dL (ref 0.3–1.2)
Total Protein: 6.2 g/dL — ABNORMAL LOW (ref 6.5–8.1)

## 2014-11-05 LAB — LIPASE, BLOOD: Lipase: 146 U/L — ABNORMAL HIGH (ref 22–51)

## 2014-11-05 MED ORDER — POLYETHYLENE GLYCOL 3350 17 G PO PACK: 17.0000 g | PACK | Freq: Every day | ORAL | Status: DC

## 2014-11-05 NOTE — Discharge Instructions (Signed)
Abdominal Pain Follow up with your cardiologist and stomach doctor. Return to the ED if you develop new or worsening symptoms. Many things can cause abdominal pain. Usually, abdominal pain is not caused by a disease and will improve without treatment. It can often be observed and treated at home. Your health care provider will do a physical exam and possibly order blood tests and X-rays to help determine the seriousness of your pain. However, in many cases, more time must pass before a clear cause of the pain can be found. Before that point, your health care provider may not know if you need more testing or further treatment. HOME CARE INSTRUCTIONS  Monitor your abdominal pain for any changes. The following actions may help to alleviate any discomfort you are experiencing:  Only take over-the-counter or prescription medicines as directed by your health care provider.  Do not take laxatives unless directed to do so by your health care provider.  Try a clear liquid diet (broth, tea, or water) as directed by your health care provider. Slowly move to a bland diet as tolerated. SEEK MEDICAL CARE IF:  You have unexplained abdominal pain.  You have abdominal pain associated with nausea or diarrhea.  You have pain when you urinate or have a bowel movement.  You experience abdominal pain that wakes you in the night.  You have abdominal pain that is worsened or improved by eating food.  You have abdominal pain that is worsened with eating fatty foods.  You have a fever. SEEK IMMEDIATE MEDICAL CARE IF:   Your pain does not go away within 2 hours.  You keep throwing up (vomiting).  Your pain is felt only in portions of the abdomen, such as the right side or the left lower portion of the abdomen.  You pass bloody or black tarry stools. MAKE SURE YOU:  Understand these instructions.   Will watch your condition.   Will get help right away if you are not doing well or get worse.   Document Released: 02/27/2005 Document Revised: 05/25/2013 Document Reviewed: 01/27/2013 [facility] Patient Information 2015 [place], [place]. This information is not intended to replace advice given to you by your health care provider. Make sure you discuss any questions you have with your health care provider.

## 2014-11-05 NOTE — ED Notes (Signed)
EDP at bedside  

## 2014-11-05 NOTE — ED Notes (Addendum)
Patient saturations at 89%.  Pt resting in bed with bed reclined.  This RN sat patient up and stimulated her.  Saturations up to 94% on RA sitting up in bed.  MD made aware waiting on pain medicine administration until improved.

## 2014-11-07 ENCOUNTER — Telehealth: Payer: Self-pay | Admitting: *Deleted

## 2014-11-07 DIAGNOSIS — R748 Abnormal levels of other serum enzymes: Secondary | ICD-10-CM | POA: Diagnosis not present

## 2014-11-07 DIAGNOSIS — R109 Unspecified abdominal pain: Secondary | ICD-10-CM | POA: Diagnosis not present

## 2014-11-07 DIAGNOSIS — Z79899 Other long term (current) drug therapy: Secondary | ICD-10-CM

## 2014-11-07 DIAGNOSIS — Z9861 Coronary angioplasty status: Secondary | ICD-10-CM

## 2014-11-07 DIAGNOSIS — E785 Hyperlipidemia, unspecified: Secondary | ICD-10-CM

## 2014-11-07 DIAGNOSIS — I2510 Atherosclerotic heart disease of native coronary artery without angina pectoris: Secondary | ICD-10-CM

## 2014-11-07 MED ORDER — FISH OIL 1000 MG PO CAPS: 2000.0000 mg | ORAL_CAPSULE | Freq: Two times a day (BID) | ORAL | Status: DC

## 2014-11-07 NOTE — Telephone Encounter (Signed)
Called patient  Informed patient - prescription  OMEGA- 3 ACID NOT COVERED WITH INSURANCE  PATIENT HAVE TO USED FENOFIBRATE MICRONIZED  CAPSULE, FENOFIBRATE TABLET,NIACOR  PER DR HARDING, START FISH OIL  2 CAPSULE  TWICE A DAY (OTC). RECHECK CMP,LIPIDS- LABS IN 3 MONTHS. WILL MAIL LAB SLIP '@3'$  MONTHS  PATIENT AWARE. PATIENT STATES SHE HAS BEEN TO ER AND GI DOCTOR, COMPLAINTS ABDOMINAL PROBLEMS- NEED A FOLLOW UP WITH DR HARDING PER GI APPOINTMENT SCHEDULE FOR 11/22/14 AT 10 AM

## 2014-11-10 DIAGNOSIS — R109 Unspecified abdominal pain: Secondary | ICD-10-CM | POA: Diagnosis not present

## 2014-11-22 ENCOUNTER — Encounter: Payer: Self-pay | Admitting: Cardiology

## 2014-11-22 ENCOUNTER — Ambulatory Visit (INDEPENDENT_AMBULATORY_CARE_PROVIDER_SITE_OTHER): Payer: Medicare Other | Admitting: Cardiology

## 2014-11-22 VITALS — BP 118/76 | HR 80 | Ht 65.5 in | Wt 153.5 lb

## 2014-11-22 DIAGNOSIS — I201 Angina pectoris with documented spasm: Secondary | ICD-10-CM

## 2014-11-22 DIAGNOSIS — I10 Essential (primary) hypertension: Secondary | ICD-10-CM

## 2014-11-22 DIAGNOSIS — E785 Hyperlipidemia, unspecified: Secondary | ICD-10-CM

## 2014-11-22 DIAGNOSIS — R002 Palpitations: Secondary | ICD-10-CM

## 2014-11-22 DIAGNOSIS — I2510 Atherosclerotic heart disease of native coronary artery without angina pectoris: Secondary | ICD-10-CM | POA: Diagnosis not present

## 2014-11-22 DIAGNOSIS — R109 Unspecified abdominal pain: Secondary | ICD-10-CM

## 2014-11-22 DIAGNOSIS — Z720 Tobacco use: Secondary | ICD-10-CM

## 2014-11-22 DIAGNOSIS — Z9861 Coronary angioplasty status: Secondary | ICD-10-CM

## 2014-11-22 MED ORDER — CLOPIDOGREL BISULFATE 75 MG PO TABS: 75.0000 mg | ORAL_TABLET | Freq: Every day | ORAL | Status: DC
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
## 2014-11-22 NOTE — Patient Instructions (Signed)
WILL MAIL LAB SLIP IN AUG /SEPT FOR CHOLESTEROL LEVEL  ONCE YOU COMPLETE YOU EFFIENT MEDICATION ,YOU CAN SWITCH TO CLOPIDOGREL ( PLAVIX 75 MG  ONE TABLET DAILY)   YOU HOLD RANEXA FOR A COUPLE MORE WEEKS IF SYMPTOMS WITH YOUR STOMACH  DO NOT GET BETTER RESTART  RANEXA.   Your physician recommends that you schedule a follow-up appointment in SEPT 2016 DR HARDING ---30 MIN APPOINTMENT.

## 2014-11-23 ENCOUNTER — Telehealth: Payer: Self-pay | Admitting: *Deleted

## 2014-11-23 NOTE — Telephone Encounter (Signed)
Received prior authorization form needed for clopidogrel 75 mg daily on 11/22/14 Started " [place] " for prior authorization - [employer]

## 2014-11-24 ENCOUNTER — Encounter: Payer: Self-pay | Admitting: Cardiology

## 2014-11-24 DIAGNOSIS — R109 Unspecified abdominal pain: Secondary | ICD-10-CM | POA: Insufficient documentation

## 2014-11-24 NOTE — Assessment & Plan Note (Signed)
Stable on current medications. Intolerant of amlodipine in the past.

## 2014-11-24 NOTE — Assessment & Plan Note (Signed)
-  related pain and anxiety. Relatively well-controlled on beta blocker.

## 2014-11-24 NOTE — Assessment & Plan Note (Signed)
Smoking cessation instruction/counseling given:  counseled patient on the dangers of tobacco use, advised patient to stop smoking, and reviewed strategies to maximize success  Not really in the contemplative state

## 2014-11-24 NOTE — Assessment & Plan Note (Signed)
Adult medical GI issues are related to pravastatin.  I would like her to go back on her current medications.we'll recheck labs in [place] prior to her followup visit. Make further adjustments based on results.

## 2014-11-24 NOTE — Assessment & Plan Note (Addendum)
With her psychosomatic symptomatology, this could very well be irritable bowel syndrome related. It sounds like she has some constipation as well. CT scan does not very forthcoming with data. I think being on the fiber and stool softeners probably a wise choice. Would defer evaluation to PCP in gastroenterology as well as possibly even urology/ gynecology. History this to see if it makes a difference, switching over her next of couple weeks to see if symptoms improve. We can then restart it to see if it has anything to do with this medication.

## 2014-11-24 NOTE — Assessment & Plan Note (Signed)
Overall chronic mild stable angina symptoms seem stable on current medications: Carvedilol, Imdur, Ranexa. She is still on Effient,which we can potentially switch to Plavix after she completes her current bottle.  Discussed importance of smoking cessation. As is usually the case she mentions her stress and anxiety levels.

## 2014-11-24 NOTE — Telephone Encounter (Signed)
Called pharmacy informed prior authorization approved.

## 2014-11-24 NOTE — Assessment & Plan Note (Signed)
Discussed importance of cessation. Continue Imdur with Ranexa. Intolerant of calcium channel blocker.

## 2014-12-15 ENCOUNTER — Other Ambulatory Visit: Payer: Self-pay | Admitting: Cardiology

## 2014-12-23 DIAGNOSIS — R3989 Other symptoms and signs involving the genitourinary system: Secondary | ICD-10-CM | POA: Diagnosis not present

## 2014-12-23 DIAGNOSIS — N390 Urinary tract infection, site not specified: Secondary | ICD-10-CM | POA: Diagnosis not present

## 2014-12-23 DIAGNOSIS — M545 Low back pain: Secondary | ICD-10-CM | POA: Diagnosis not present

## 2014-12-27 IMAGING — CR DG CHEST 2V
2 series · 2 of 2 positions shown · non-contrast
Comparison: 11/07/2011.

CLINICAL DATA: Left arm pain.

CHEST - 2 VIEW

[w chest pa]
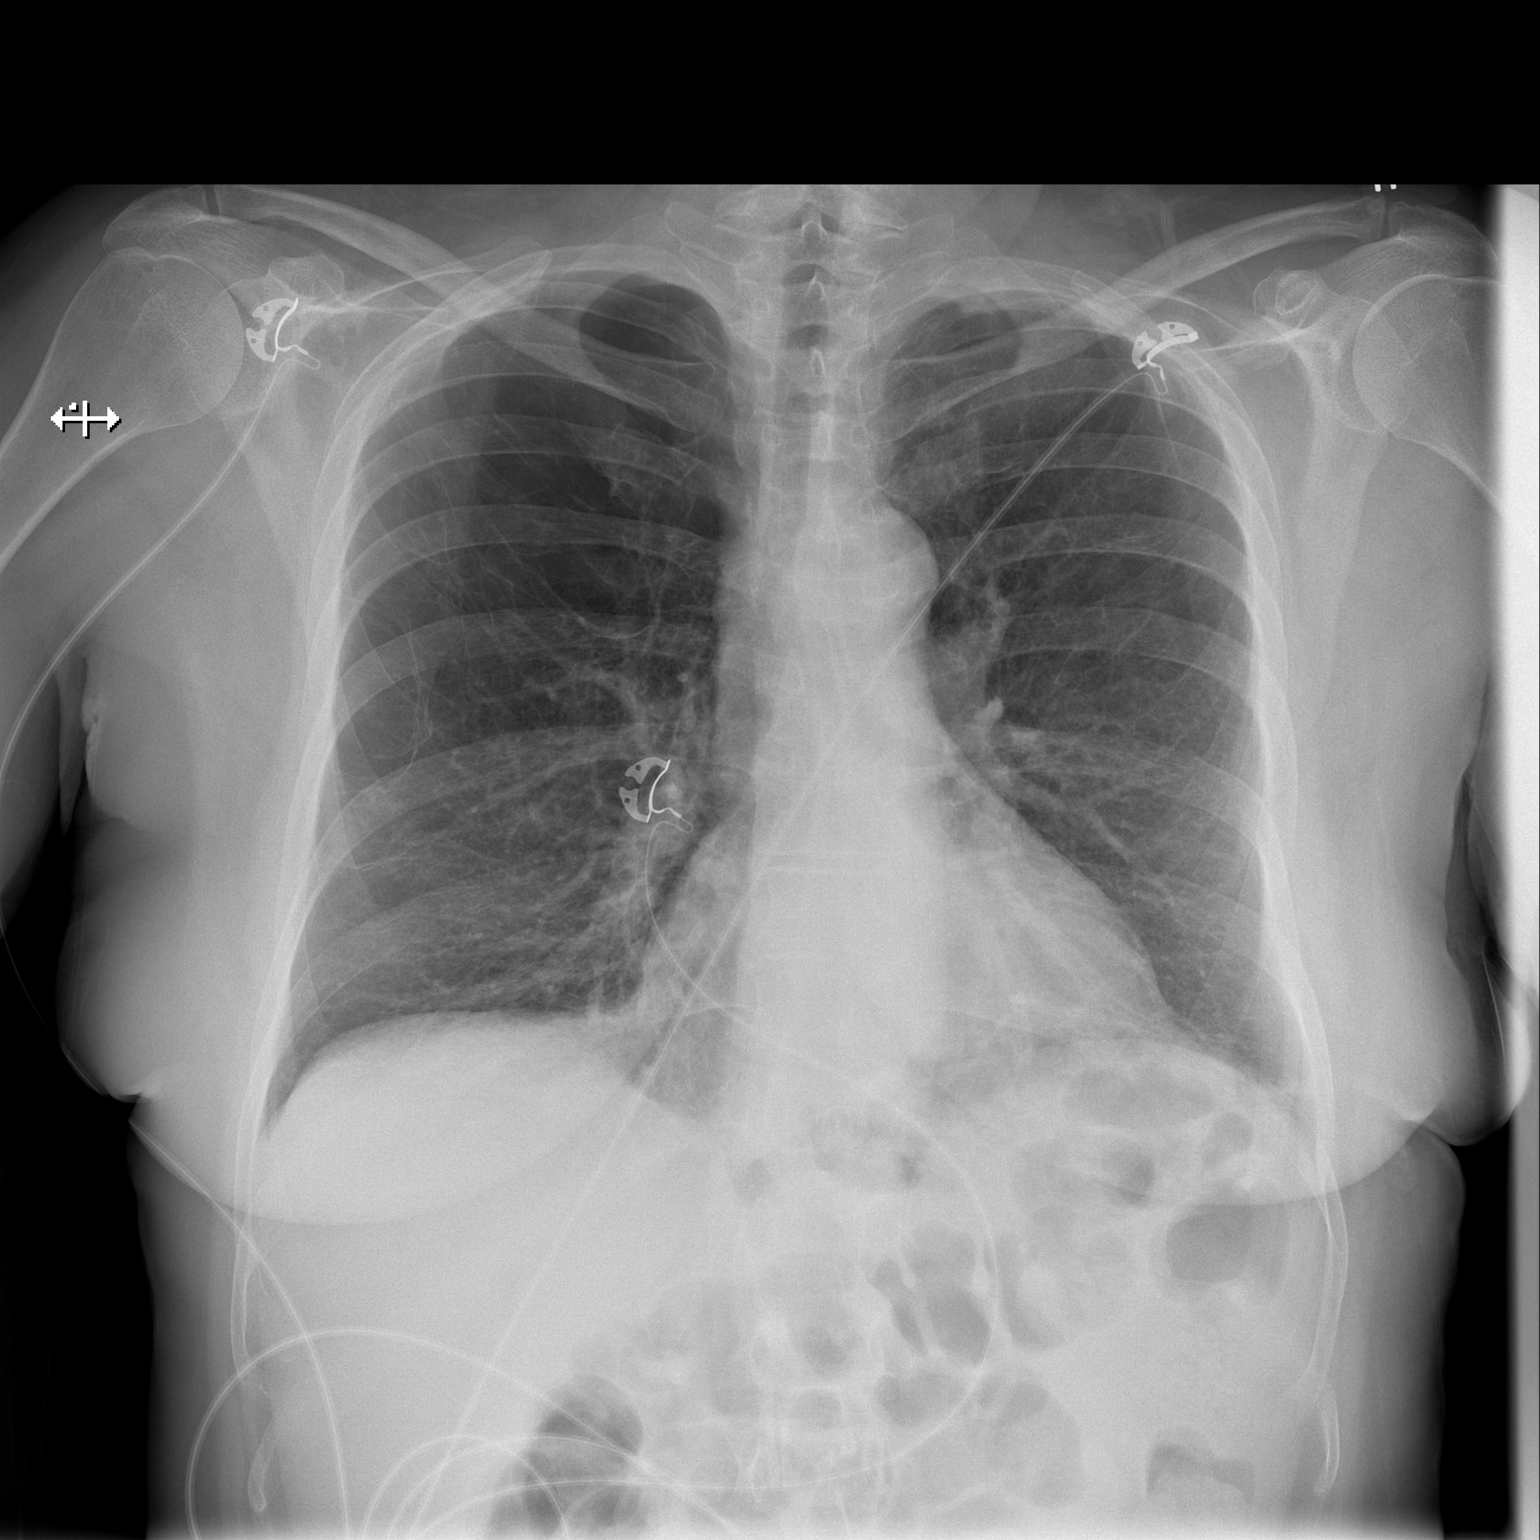

[w chest lat]
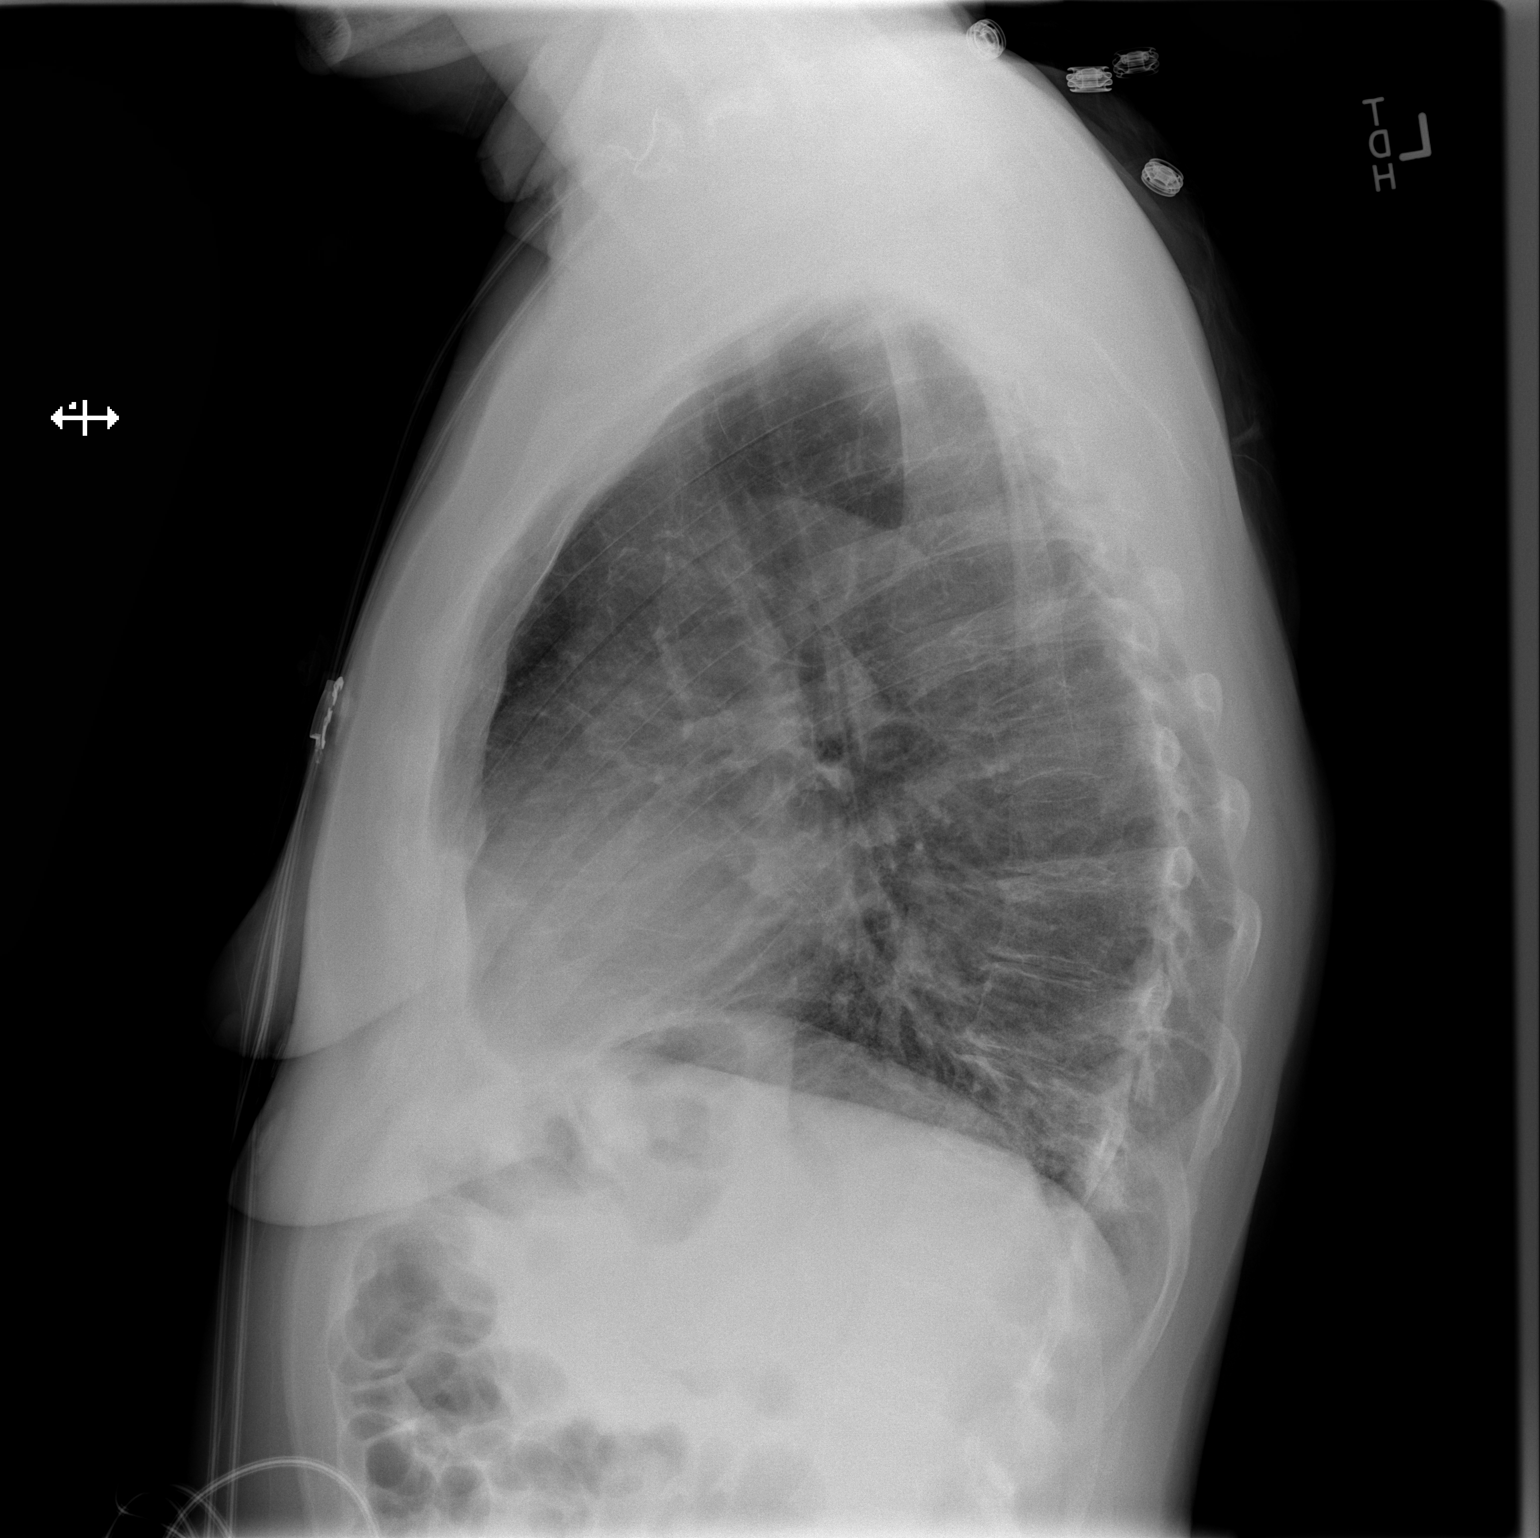

[2 of 2 positions shown; findings below may reference images not displayed]

FINDINGS: Normal sized heart.  Stable linear scarring at both lung
bases.  Stable bullous changes, especially in the right upper lobe.
Diffuse osteopenia.  Mild thoracic spine degenerative changes.
IMPRESSION: Stable changes of COPD.  No acute abnormality.

## 2014-12-27 IMAGING — MR MR HEAD W/O CM
6 of 8 series · 31 of 48 positions shown · non-contrast
Comparison: CT head without contrast 06/16/2010.MRI brain
09/06/2007.

CLINICAL DATA: Left arm and face numbness.

MRI HEAD WITHOUT CONTRAST
TECHNIQUE: Multiplanar, multiecho pulse sequences of the brain and
surrounding structures were obtained according to standard protocol
without intravenous contrast.

[Series 3: DWI · axial · 5.0mm · 1.09mm/px · z∈[-51,+96]mm · 11 of 56 slices shown (1 of 2)]
[im 1/56]
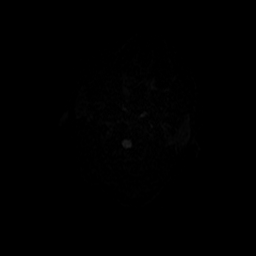
[im 6/56]
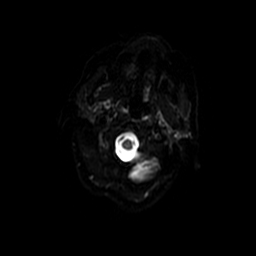
[im 12/56]
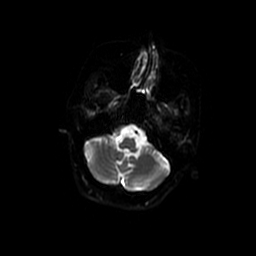
[im 17/56]
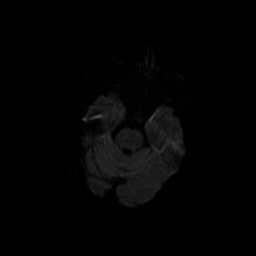
[im 23/56]
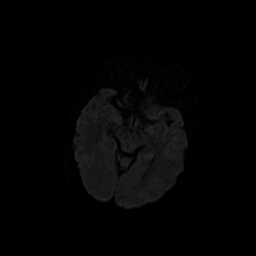
[im 28/56]
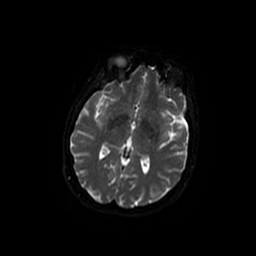
[im 34/56]
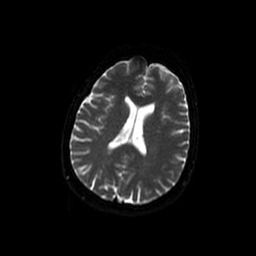
[im 39/56]
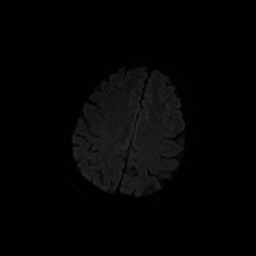
[im 45/56]
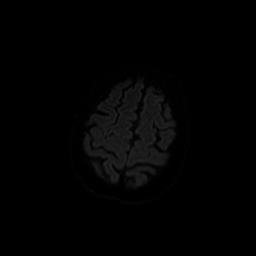
[im 50/56]
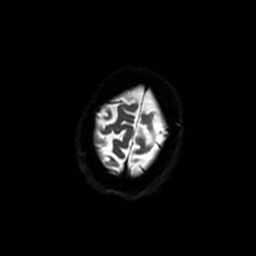
[im 56/56]
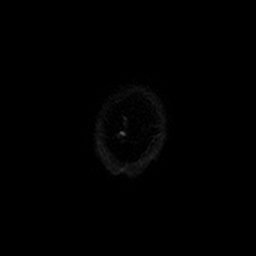

[Series 4: T1 · sagittal · 5.0mm · 0.47mm/px · 1 of 18 slices shown]
[im 1/18]
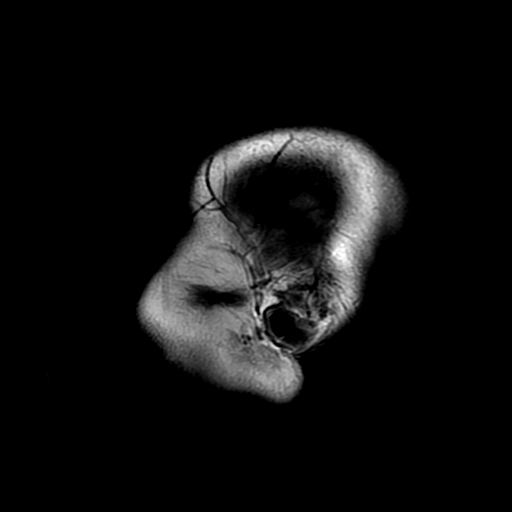

[Series 5: T2 · axial · 5.0mm · 0.47mm/px · z∈[-4,+134]mm · 4 of 21 slices shown (1 of 2)]
[im 1/21]
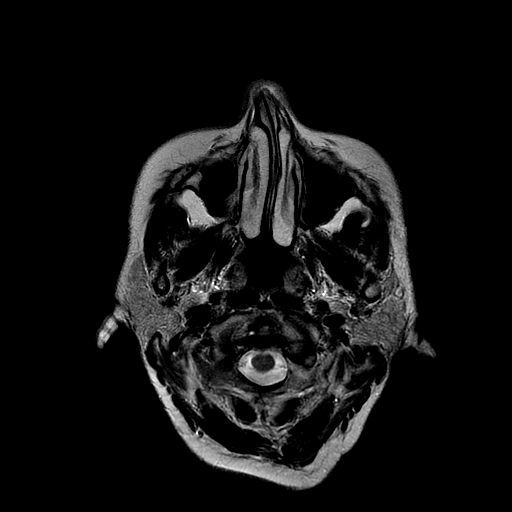
[im 7/21]
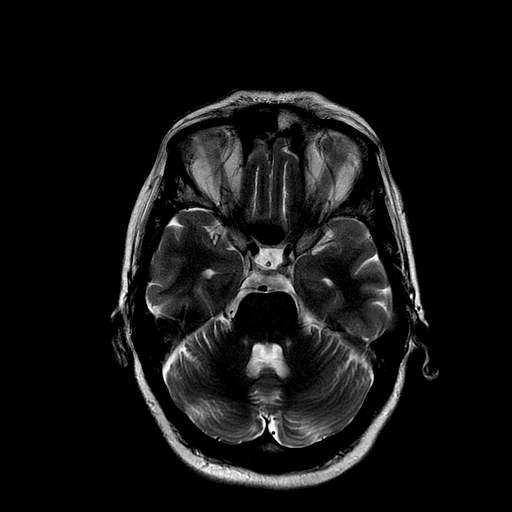
[im 14/21]
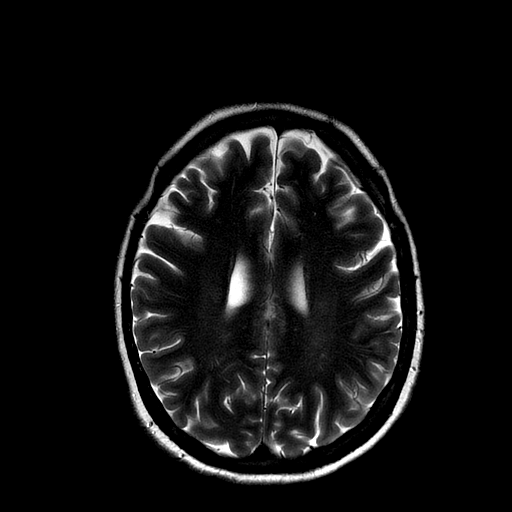
[im 21/21]
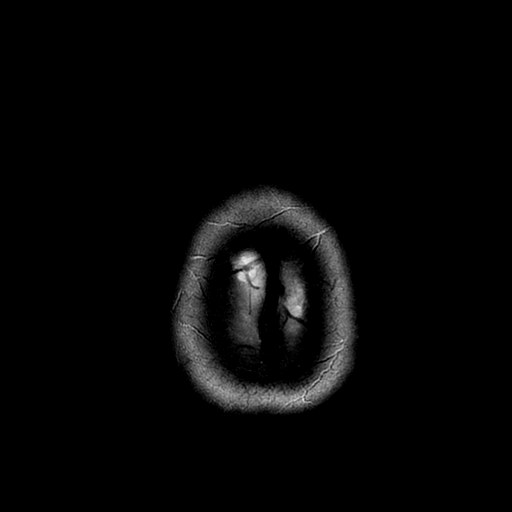

[Series 6: FLAIR · axial · 5.0mm · 0.47mm/px · z∈[-4,+134]mm · 4 of 21 slices shown]
[im 1/21]
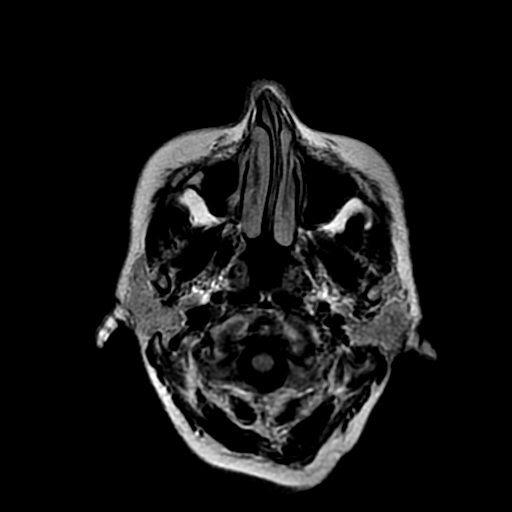
[im 7/21]
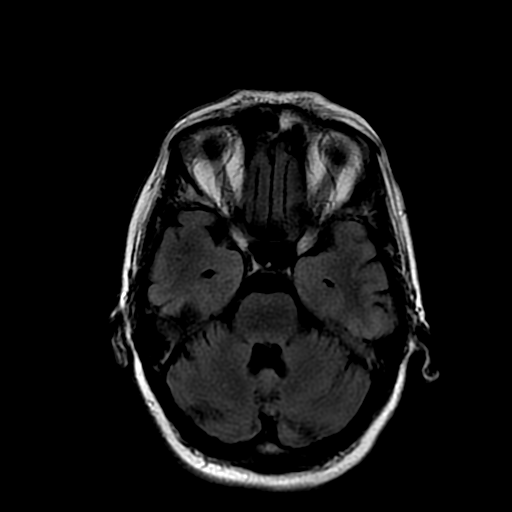
[im 14/21]
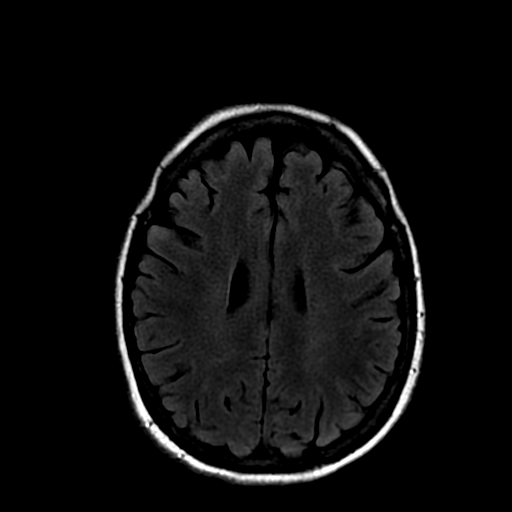
[im 21/21]
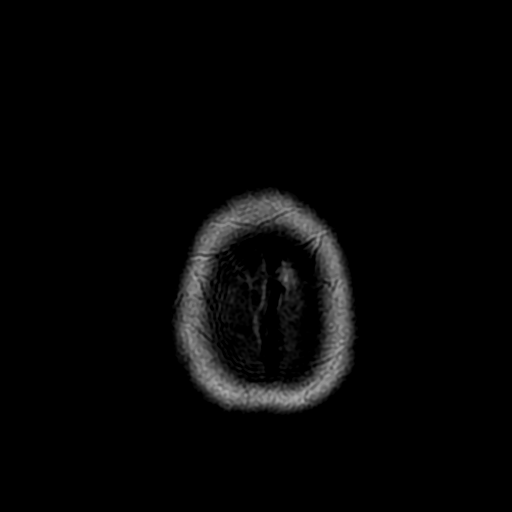

[Series 9: T2 · coronal · 5.0mm · 0.39mm/px · 5 of 25 slices shown (2 of 2)]
[im 1/25]
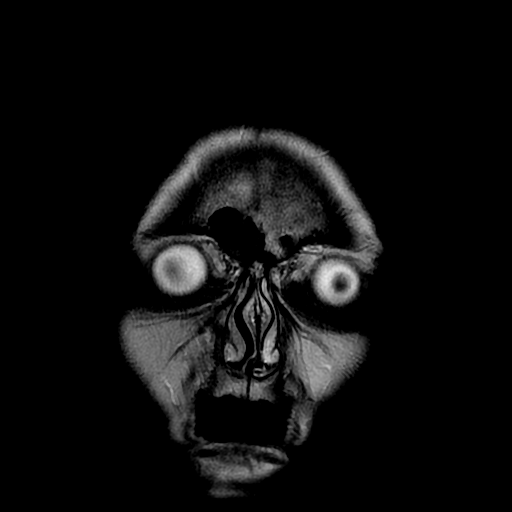
[im 7/25]
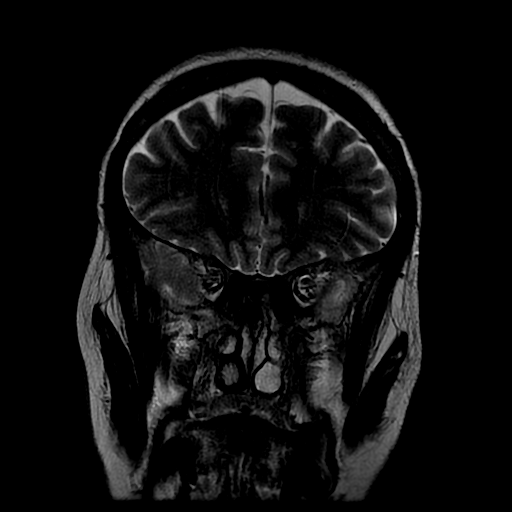
[im 13/25]
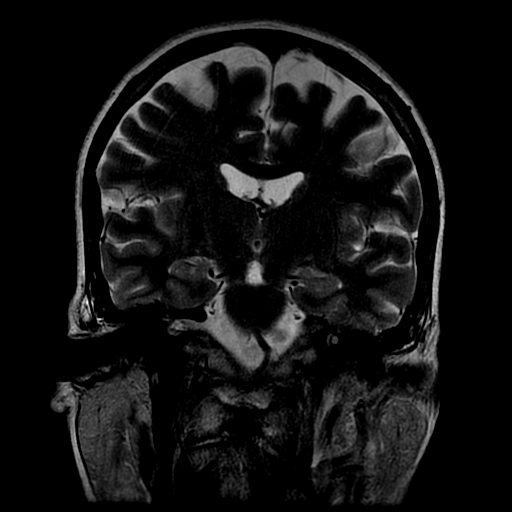
[im 19/25]
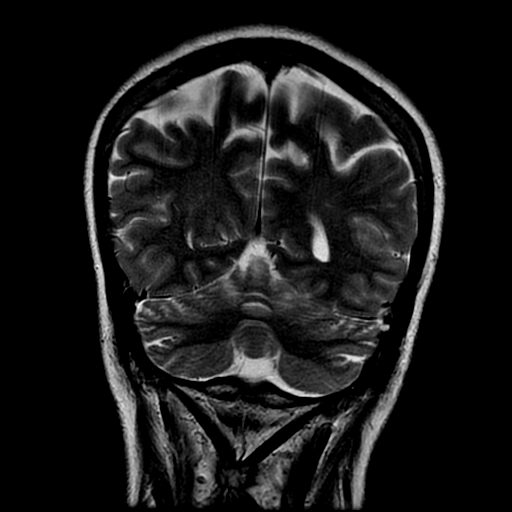
[im 25/25]
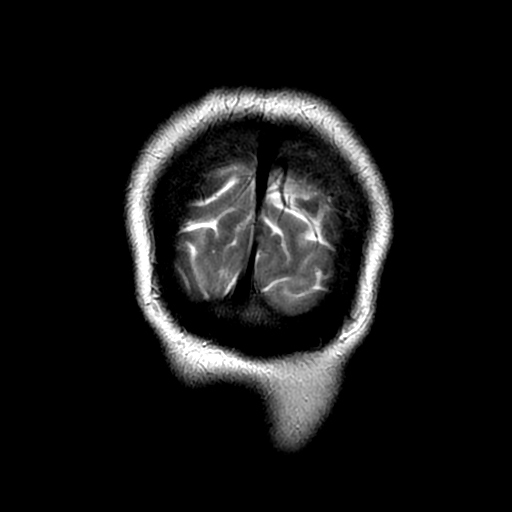

[Series 300: DWI · axial · 5.0mm · 1.09mm/px · z∈[-51,+96]mm · 6 of 28 slices shown (2 of 2)]
[im 1/28]
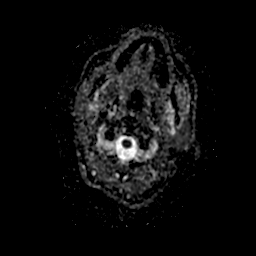
[im 6/28]
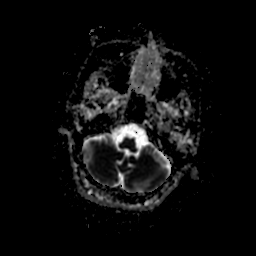
[im 11/28]
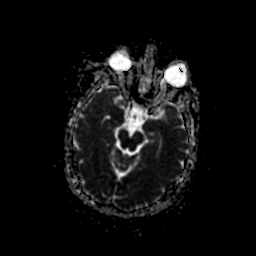
[im 17/28]
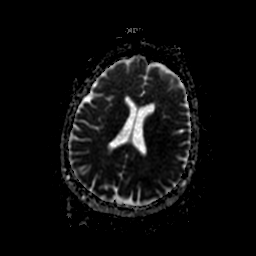
[im 22/28]
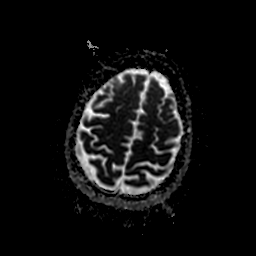
[im 28/28]
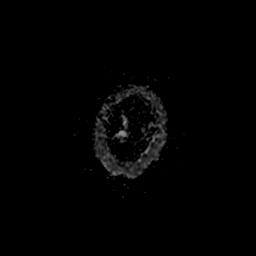

[31 of 48 positions shown; findings below may reference images not displayed]

FINDINGS: Mild generalized atrophy is present.  There is
disproportionate atrophy of the cerebellum.  No acute infarct,
hemorrhage, mass lesion is present.  The ventricles are normal
size.  No significant extra-axial fluid collection is present.
There is no significant white matter disease.

Flow is present in the major intracranial arteries.  The globes and
orbits are intact.  The paranasal sinuses and mastoid air cells are
clear.
IMPRESSION: 1.  Mild generalized atrophy is nonspecific.
2.  Disproportionate cerebellar atrophy.  This is seen most
commonly in the setting of alcohol use or the chronic use of anti
epileptic medication.
3.  No acute intracranial abnormality.

## 2014-12-30 ENCOUNTER — Other Ambulatory Visit: Payer: Self-pay

## 2014-12-30 DIAGNOSIS — Z1231 Encounter for screening mammogram for malignant neoplasm of breast: Secondary | ICD-10-CM

## 2015-01-03 ENCOUNTER — Telehealth: Payer: Self-pay | Admitting: Cardiology

## 2015-01-03 DIAGNOSIS — I10 Essential (primary) hypertension: Secondary | ICD-10-CM

## 2015-01-03 DIAGNOSIS — E785 Hyperlipidemia, unspecified: Secondary | ICD-10-CM

## 2015-01-03 NOTE — Telephone Encounter (Signed)
Returned call to patient no answer.Left message on personal voice mail will mail lab order for fasting lab.

## 2015-01-03 NOTE — Telephone Encounter (Signed)
RN SPOKE TO DR HARDING   PER DR HARDING , MAY HOLD CLOPIDOGREL IF NEED TO TAKE trimethoprim RN spoke to patient.  Her symptoms of being very weak probably not contributing to the taking clopidogrel per Dr [name]. Patient states she will have to be on  Trimethoprim for 3 months and she did not feel comfortable not taking clopidogrel. She states urologist told her to hold the trimethoprim.  RN informed patient to follow urologist  Instruction. She is aware and that symptoms are probably not cause by clopidogrel.

## 2015-01-03 NOTE — Telephone Encounter (Signed)
Michele [name] is calling to have a lab order sent to her before her appt on 02/23/15. Thanks    [facility]

## 2015-01-03 NOTE — Telephone Encounter (Signed)
Pt called in stating that she has been very weak lately and she isn't sure if it is due to the recent change in blood thinners from Effient to Clopidogrel in conjustion with a antibiotic that her urologist prescribed to her which is Trimethoprim. She called the urologist and she was advised to stop taking the antibiotic until she spoke with [place] about the Clopidogrel. Please advise  Thanks

## 2015-01-09 ENCOUNTER — Other Ambulatory Visit: Payer: Self-pay | Admitting: Cardiology

## 2015-01-09 NOTE — Telephone Encounter (Signed)
REFILL 

## 2015-01-09 NOTE — Telephone Encounter (Signed)
Rx(s) sent to pharmacy electronically.  

## 2015-01-11 DIAGNOSIS — I10 Essential (primary) hypertension: Secondary | ICD-10-CM | POA: Diagnosis not present

## 2015-01-11 DIAGNOSIS — E785 Hyperlipidemia, unspecified: Secondary | ICD-10-CM | POA: Diagnosis not present

## 2015-01-12 LAB — COMPREHENSIVE METABOLIC PANEL
ALT: 13 U/L (ref 6–29)
AST: 24 U/L (ref 10–35)
Albumin: 4 g/dL (ref 3.6–5.1)
Alkaline Phosphatase: 58 U/L (ref 33–130)
BUN: 10 mg/dL (ref 7–25)
CO2: 27 mmol/L (ref 20–31)
Calcium: 9.3 mg/dL (ref 8.6–10.4)
Chloride: 105 mmol/L (ref 98–110)
Creat: 0.77 mg/dL (ref 0.50–0.99)
Glucose, Bld: 135 mg/dL — ABNORMAL HIGH (ref 65–99)
Potassium: 4.4 mmol/L (ref 3.5–5.3)
Sodium: 140 mmol/L (ref 135–146)
Total Bilirubin: 0.5 mg/dL (ref 0.2–1.2)
Total Protein: 6.5 g/dL (ref 6.1–8.1)

## 2015-01-12 LAB — LIPID PANEL
Cholesterol: 178 mg/dL (ref 125–200)
HDL: 37 mg/dL — ABNORMAL LOW (ref >=46)
LDL Cholesterol: 115 mg/dL (ref <130)
Total CHOL/HDL Ratio: 4.8 Ratio (ref <=5.0)
Triglycerides: 129 mg/dL (ref <150)
VLDL: 26 mg/dL (ref <30)

## 2015-01-16 ENCOUNTER — Telehealth: Payer: Self-pay | Admitting: Cardiology

## 2015-01-16 ENCOUNTER — Encounter: Payer: Self-pay | Admitting: Cardiovascular Disease

## 2015-01-16 ENCOUNTER — Ambulatory Visit (INDEPENDENT_AMBULATORY_CARE_PROVIDER_SITE_OTHER): Payer: Medicare Other | Admitting: Cardiovascular Disease

## 2015-01-16 VITALS — BP 120/88 | HR 73 | Ht 65.5 in | Wt 154.5 lb

## 2015-01-16 DIAGNOSIS — Z720 Tobacco use: Secondary | ICD-10-CM | POA: Diagnosis not present

## 2015-01-16 DIAGNOSIS — I2510 Atherosclerotic heart disease of native coronary artery without angina pectoris: Secondary | ICD-10-CM

## 2015-01-16 DIAGNOSIS — E785 Hyperlipidemia, unspecified: Secondary | ICD-10-CM | POA: Diagnosis not present

## 2015-01-16 DIAGNOSIS — Z9861 Coronary angioplasty status: Secondary | ICD-10-CM | POA: Diagnosis not present

## 2015-01-16 DIAGNOSIS — I201 Angina pectoris with documented spasm: Secondary | ICD-10-CM | POA: Diagnosis not present

## 2015-01-16 DIAGNOSIS — I10 Essential (primary) hypertension: Secondary | ICD-10-CM

## 2015-01-16 MED ORDER — ISOSORBIDE MONONITRATE ER 60 MG PO TB24: 60.0000 mg | ORAL_TABLET | Freq: Every day | ORAL | Status: DC

## 2015-01-16 NOTE — Assessment & Plan Note (Signed)
History of hyperlipidemia on pravastatin with her most recent lipid profile performed 01/11/15 revealing a total cholesterol 178, LDL 1:15 and HDL of 37

## 2015-01-16 NOTE — Telephone Encounter (Signed)
Pt called in stating that she had some lab work done last week and she would like to know if those results were available yet. Please call  Thanks

## 2015-01-16 NOTE — Patient Instructions (Signed)
Dr Gwenlyn Found has recommended making the following medication changes: INCREASE Isosorbide to 60 mg. You may take 2 tablets of your current prescription until that bottle runs out. A new prescription has been sent to your pharmacy, [facility] on [address].  Your physician has requested that you have a lexiscan myoview. For further information please visit [email or web address]. Please follow instruction sheet, as given.  Dr Gwenlyn Found recommends that you schedule a follow-up appointment after your stress test with Dr [name].

## 2015-01-16 NOTE — Progress Notes (Signed)
01/16/2015 Michele [name]   September 12, 1955  [id number]  Primary Physician Michele Frees, MD Primary Cardiologist: Michele Harp MD Michele [name]   HPI:  [name] is a 59 year old female patient of Dr. Allison [name] history of tobacco abuse, hypertension, hyperlipidemia and known CAD. She's had multiple coronary interventions in the past. Dr. Ellyn [name] mentions in her note that he does not feel that repeat intervention on her circumflex stent would be efficacious because of the small nature of her vessels. He did begin her on Ranexa which she has stopped on her own.   Current Outpatient Prescriptions  Medication Sig Dispense Refill  . Calcium Carbonate-Vitamin D (CALCIUM 600+D PO) Take 1 tablet by mouth 2 (two) times daily.     . carvedilol (COREG) 3.125 MG tablet TAKE 1 TABLET BY MOUTH TWICE DAILY WITH A MEAL 60 tablet 10  . clonazePAM (KLONOPIN) 2 MG tablet Take 2 mg by mouth 3 (three) times daily as needed for anxiety.     . clopidogrel (PLAVIX) 75 MG tablet Take 1 tablet (75 mg total) by mouth daily. 30 tablet 6  . Coenzyme Q10 (COQ10) 100 MG CAPS Take 100 mg by mouth daily.     . isosorbide mononitrate (IMDUR) 30 MG 24 hr tablet TAKE 1 TABLET BY MOUTH EVERY NIGHT AT BEDTIME 90 tablet 2  . NITROSTAT 0.4 MG SL tablet PLACE 1 TABLET UNDER TONGUE AS NEEDED FOR CHEST PAIN EVERY 5 MINUTES UP TO 3 TIMES AS NEEDED 25 tablet 1  . Omega-3 Fatty Acids (FISH OIL) 1000 MG CAPS Take 2 capsules (2,000 mg total) by mouth 2 (two) times daily. 120 capsule 11  . pantoprazole (PROTONIX) 40 MG tablet Take 40 mg by mouth every other day.     . polyethylene glycol (MIRALAX) packet Take 17 g by mouth daily. 14 each 0  . pravastatin (PRAVACHOL) 20 MG tablet Take 20 mg by mouth at bedtime.    [name] PRESCRIPTION MEDICATION Take 1 tablet by mouth 3 (three) times daily as needed. IBGARD    . tiotropium (SPIRIVA) 18 MCG inhalation capsule Place 18 mcg into inhaler and inhale daily.    . Wheat Dextrin  (BENEFIBER DRINK MIX PO) Take 1 scoop by mouth daily.     No current facility-administered medications for this visit.    Allergies  Allergen Reactions  . Ibuprofen Other (See Comments)    GI upset  . Aspirin Other (See Comments)     GI upset (takes low dose aspirin at night) - Stomach ache - No stomach bleed Pt can take enteric coated  . Ciprofloxacin     Low blood sugar, shakiness  . Crestor [Rosuvastatin]     Myalgias   . Lipitor [Atorvastatin]     Myalgias   . Plavix [Clopidogrel Bisulfate] Other (See Comments)    NON-RESPONDER   202 ASSAY  . Sulfonamide Derivatives Itching and Rash  . Wellbutrin [Bupropion] Palpitations    Social History   Social History  . Marital Status: Divorced    Spouse Name: N/A  . Number of Children: 5  . Years of Education: N/A   Occupational History  . Disabled     Social History Main Topics  . Smoking status: Current Every Day Smoker -- 0.50 packs/day for 40 years    Types: Cigarettes  . Smokeless tobacco: Never Used     Comment: 04/15/12 "I quit once for 2 1/2 years; smoking cessation counselor already here to visit"; done to less than 1/2 ppd (  03/02/2013) - "1 pack per week" - 05/24/13  . Alcohol Use: No     Comment: occasional  . Drug Use: No  . Sexual Activity: Not Currently    Birth Control/ Protection: Post-menopausal   Other Topics Concern  . Not on file   Social History Narrative   Divorced mother of 1 and a grandmother 27, great-grandmother of 1    On disability, previously worked as a Educational psychologist.  Quit smoking 06/2007 but restarted 1/11 -- smoking a pack a day.  -- now a pack lasts a week.   Does not drink alcohol.   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   0 Caffeine drinks daily      Review of Systems: General: negative for chills, fever, night sweats or weight changes.  Cardiovascular: negative for chest pain, dyspnea on exertion, edema, orthopnea, palpitations, paroxysmal nocturnal  dyspnea or shortness of breath Dermatological: negative for rash Respiratory: negative for cough or wheezing Urologic: negative for hematuria Abdominal: negative for nausea, vomiting, diarrhea, bright red blood per rectum, melena, or hematemesis Neurologic: negative for visual changes, syncope, or dizziness All other systems reviewed and are otherwise negative except as noted above.    Blood pressure 120/88, pulse 73, height 5' 5.5" (1.664 m), weight 154 lb 8 oz (70.081 kg).  General appearance: alert and no distress Neck: no adenopathy, no carotid bruit, no JVD, supple, symmetrical, trachea midline and thyroid not enlarged, symmetric, no tenderness/mass/nodules Lungs: clear to auscultation bilaterally Heart: regular rate and rhythm, S1, S2 normal, no murmur, click, rub or gallop Extremities: extremities normal, atraumatic, no cyanosis or edema  EKG normal/73 without ST or T-wave changes. I presume reviewed this EKG  ASSESSMENT AND PLAN:   Tobacco abuse, ongoing History of ongoing tobacco abuse recalcitrant risk factor modification  Essential hypertension History of hypertension with blood pressure measured at 120/88. She is on carvedilol. Continue current meds at current dosing  Dyslipidemia, goal LDL below 70 History of hyperlipidemia on pravastatin with her most recent lipid profile performed 01/11/15 revealing a total cholesterol 178, LDL 1:15 and HDL of 37  CAD -S/P MI-PCI AVG 5/13 then staged DES to RCA  11/06/11. ISR- PCI 11/19/12, cath 02/18/13- no ISR, + spasm, Myoview low risk Nov 2014 History of CAD status post multiple interventions in the past as outlined above. 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 Dr. Ellyn [name] prescribed Ranexa which she discontinued. The patient apparently has been more pronounced in the last several weeks. Her EKG shows no acute changes. I am going to increase her Imdur from 30-60 mg a day and will obtain a pharmacologic Myoview stress test. She will  see Dr. Ellyn [name] back in follow-up      Michele Harp MD [facility], [facility] 01/16/2015 3:31 PM

## 2015-01-16 NOTE — Assessment & Plan Note (Signed)
History of ongoing tobacco abuse recalcitrant risk factor modification 

## 2015-01-16 NOTE — Telephone Encounter (Addendum)
Informed patient result will be reviewed when Dr [name] returns to office next week. RN did give patient result numbers. Patient states she would like an appointment -she is having the same  symptoms as in the past - she would like to be evaluated. She states she is only available today or tomorrow  Due to her brother going to the hospital on wednesday Offered appointment with Dr Gwenlyn Found for today She states she will be able to come in.  appt for 2:45 to day.

## 2015-01-16 NOTE — Assessment & Plan Note (Signed)
History of hypertension with blood pressure measured at 120/88. She is on carvedilol. Continue current meds at current dosing

## 2015-01-16 NOTE — Assessment & Plan Note (Signed)
History of CAD status post multiple interventions in the past as outlined above. 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 Dr. [name] prescribed Ranexa which she discontinued. The patient apparently has been more pronounced in the last several weeks. Her EKG shows no acute changes. I am going to increase her Imdur from 30-60 mg a day and will obtain a pharmacologic Myoview stress test. She will see Dr. [name] back in follow-up

## 2015-01-17 ENCOUNTER — Telehealth (HOSPITAL_COMMUNITY): Payer: Self-pay | Admitting: *Deleted

## 2015-01-17 ENCOUNTER — Ambulatory Visit (HOSPITAL_COMMUNITY): Payer: Medicare Other | Attending: Cardiovascular Disease

## 2015-01-17 DIAGNOSIS — R0602 Shortness of breath: Secondary | ICD-10-CM | POA: Insufficient documentation

## 2015-01-17 DIAGNOSIS — Z9861 Coronary angioplasty status: Secondary | ICD-10-CM | POA: Diagnosis not present

## 2015-01-17 DIAGNOSIS — I10 Essential (primary) hypertension: Secondary | ICD-10-CM | POA: Insufficient documentation

## 2015-01-17 DIAGNOSIS — R079 Chest pain, unspecified: Secondary | ICD-10-CM | POA: Insufficient documentation

## 2015-01-17 DIAGNOSIS — I2510 Atherosclerotic heart disease of native coronary artery without angina pectoris: Secondary | ICD-10-CM | POA: Diagnosis not present

## 2015-01-17 DIAGNOSIS — R42 Dizziness and giddiness: Secondary | ICD-10-CM | POA: Diagnosis not present

## 2015-01-17 LAB — MYOCARDIAL PERFUSION IMAGING
LV dias vol: 72 mL
LV sys vol: 27 mL
Peak HR: 101 {beats}/min
RATE: 0.31
Rest HR: 69 {beats}/min
SDS: 1
SRS: 10
SSS: 11
TID: 0.98

## 2015-01-17 MED ORDER — TECHNETIUM TC 99M SESTAMIBI GENERIC - CARDIOLITE
10.9000 | Freq: Once | INTRAVENOUS | Status: AC | PRN
  Administered 2015-01-17: 10.9 via INTRAVENOUS

## 2015-01-17 MED ORDER — TECHNETIUM TC 99M SESTAMIBI GENERIC - CARDIOLITE
32.9000 | Freq: Once | INTRAVENOUS | Status: AC | PRN
  Administered 2015-01-17: 32.9 via INTRAVENOUS

## 2015-01-17 MED ORDER — REGADENOSON 0.4 MG/5ML IV SOLN
0.4000 mg | Freq: Once | INTRAVENOUS | Status: AC
  Administered 2015-01-17: 0.4 mg via INTRAVENOUS

## 2015-01-17 NOTE — Telephone Encounter (Signed)
Patient given detailed instructions per Myocardial Perfusion Study Information Sheet for test on 01/17/15 at 1200. Patient Notified to arrive 15 minutes early, and that it is imperative to arrive on time for appointment to keep from having the test rescheduled. Patient verbalized understanding. E.Kubak,CNMT

## 2015-01-18 ENCOUNTER — Telehealth: Payer: Self-pay | Admitting: Cardiology

## 2015-01-18 NOTE — Telephone Encounter (Signed)
Agree with the advice to divide Imdur into '30mg'$  bid.  If she does not want to do this she could consider restarting Ranexa.

## 2015-01-18 NOTE — Telephone Encounter (Signed)
Dr. Gwenlyn Found saw patient 8/15 for c/o chest pain.  She had discontinued Ranexa on her own.  At visit on 8/15, Dr. Gwenlyn Found adjusted isosorbide from '30mg'$  to '60mg'$  for pt's problems of recurrent angina.  Pt states she took increased dose x 1 last night - noted "back hurt", "had worse chest pain" (though on discussion w/ patient, rapid HR was principal issue & source of this discomfort)  She does not have BP, HR readings.  Advised she could divide dose up ('30mg'$  BID) to see if this helps - may need adjustment period to get used to increased dose. Pt does not want to do this. She wants to return to '30mg'$  DAILY for isosorbide.  Advised I would send to primary cardiologist for review. Instructed to call if new/changing symptoms.

## 2015-01-18 NOTE — Telephone Encounter (Signed)
Per Answering Service: Her Isosorbide Mononitrate was changed to 60 mg> It kept her up all night,made her heart race.

## 2015-01-19 NOTE — Telephone Encounter (Signed)
Spoke to patient. Gave reassurance that recommendation was to do '30mg'$  BID.  Discussed concerns w/ nitrates. Discussed costs r/t Ranexa & nitrates vs nitrates only.  Instructed pt to try this, if problems return/she is not better w/in a week or so, call back to discuss restarting Ranexa.

## 2015-01-29 IMAGING — US US EXTREM LOW*R* LIMITED
1 series · 14 of 24 positions shown · non-contrast
Comparison: None.

CLINICAL DATA: Lump and anterior right thigh.

ULTRASOUND RIGHT LOWER EXTREMITY LIMITED
TECHNIQUE: Ultrasound examination of the region of interest in the
right lower extremity was performed.

[Series 1: us extrem low*right* limited · 0.09mm/px · 14 of 24 slices shown]
[im 1/24]
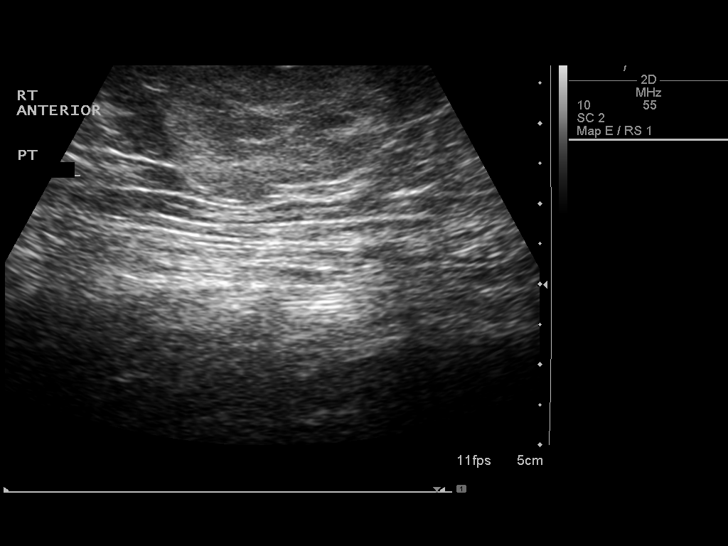
[im 3/24]
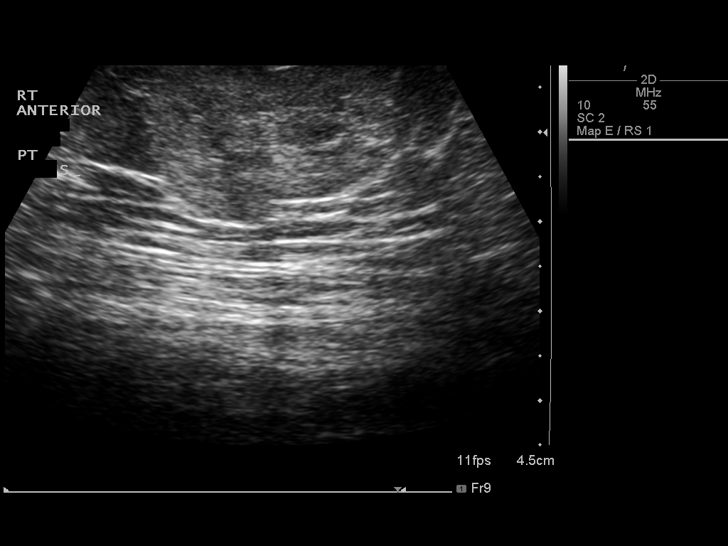
[im 5/24]
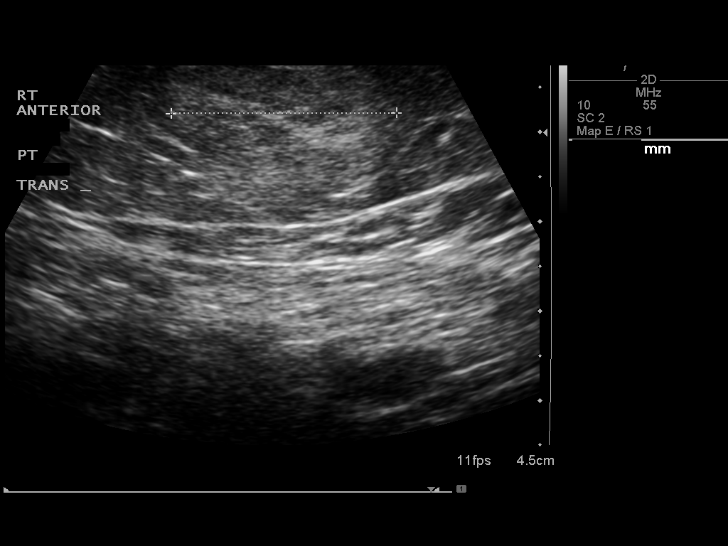
[im 7/24]
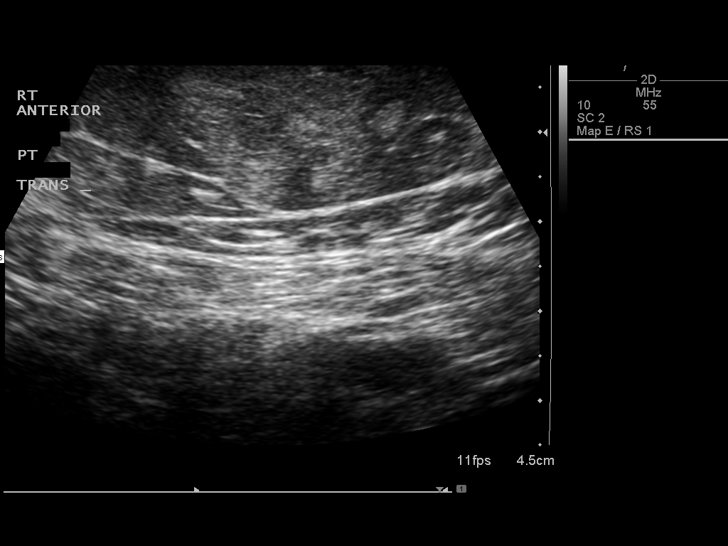
[im 8/24]
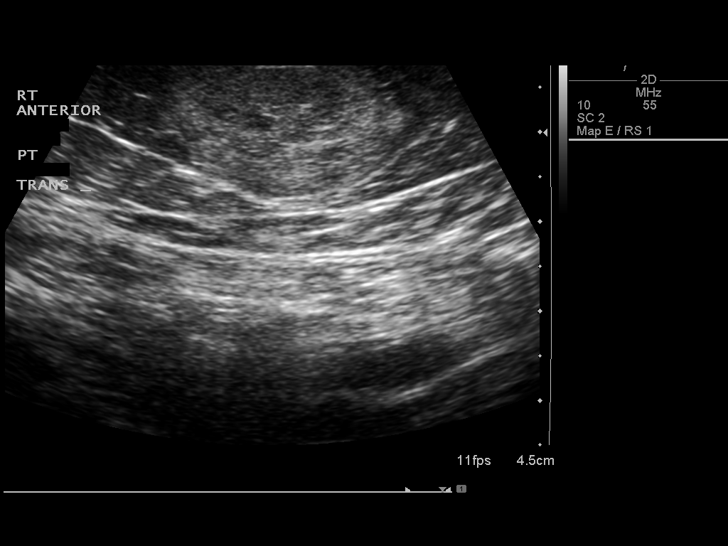
[im 10/24]
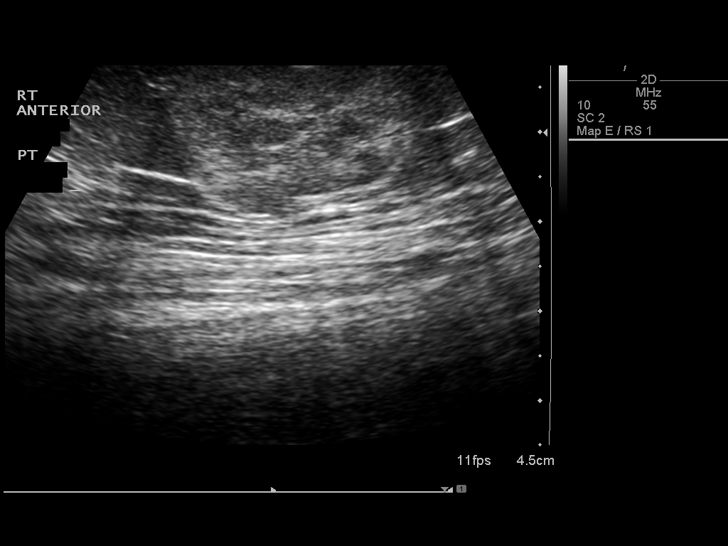
[im 12/24]
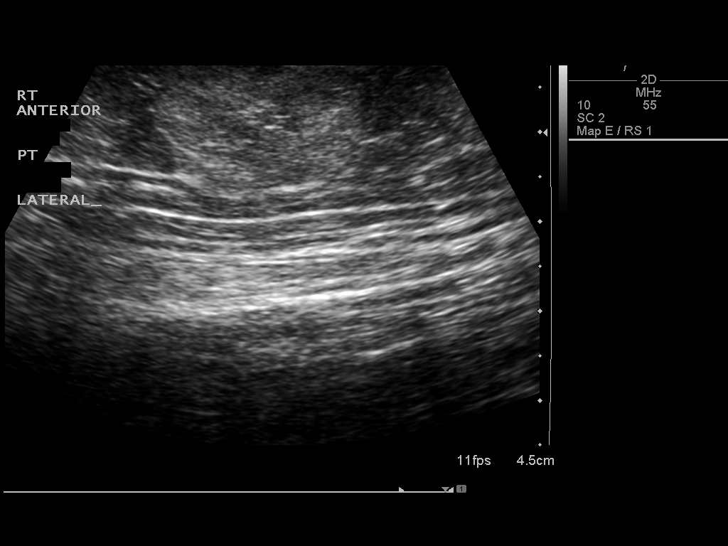
[im 13/24]
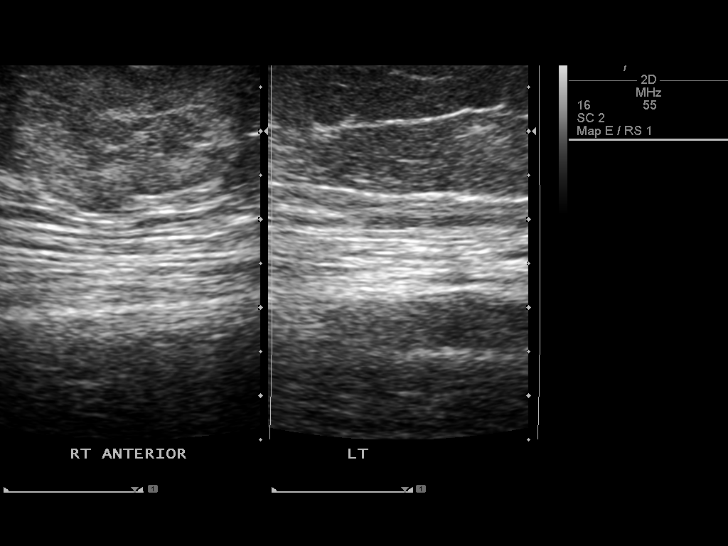
[im 15/24]
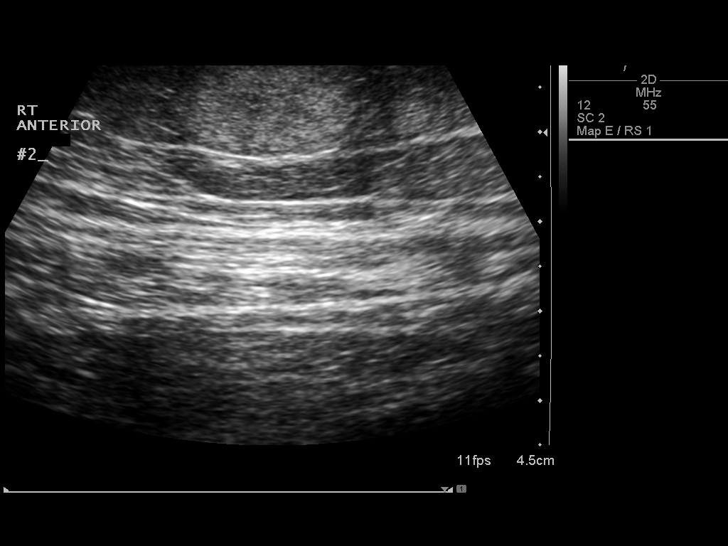
[im 17/24]
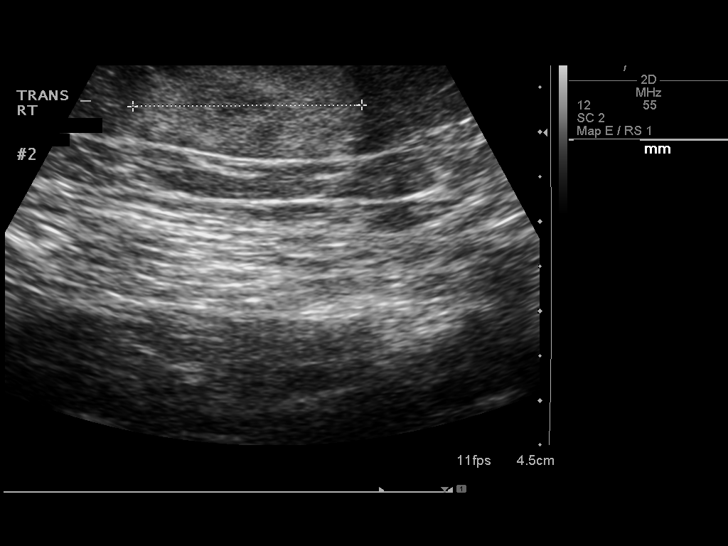
[im 19/24]
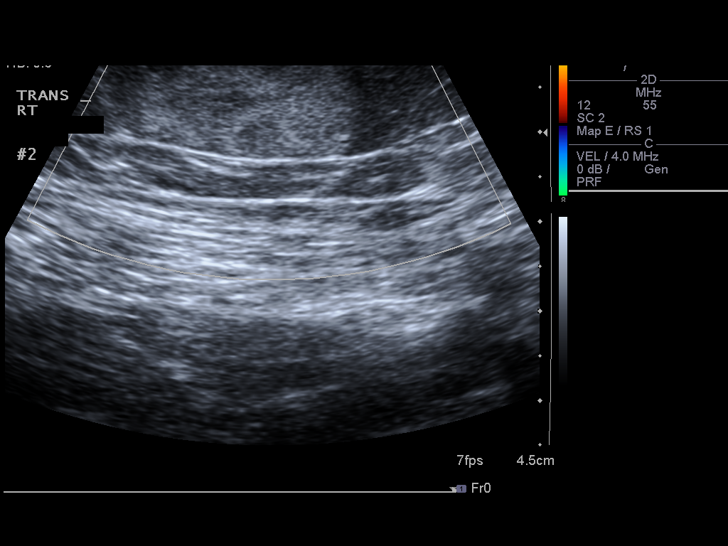
[im 20/24]
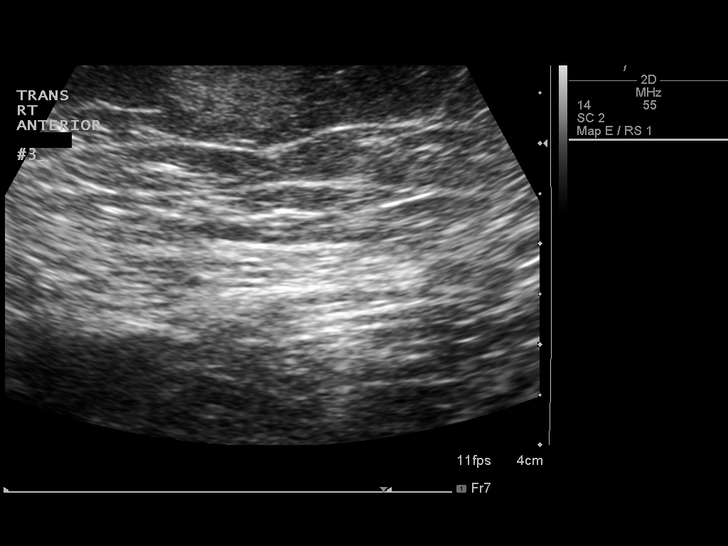
[im 22/24]
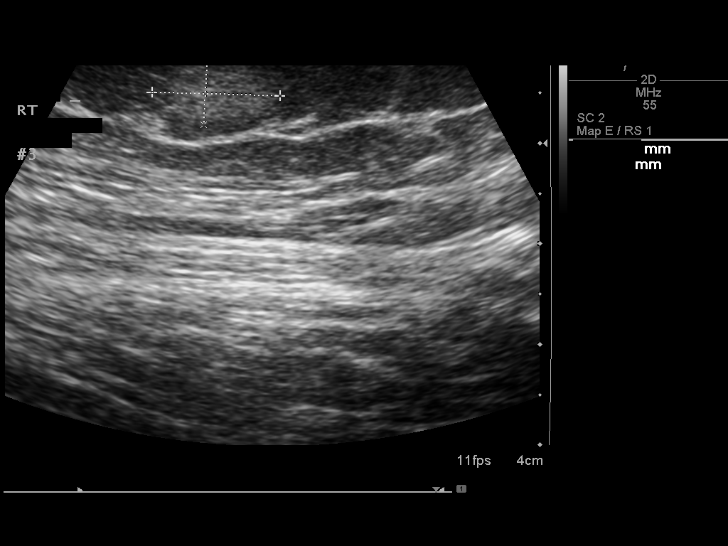
[im 24/24]
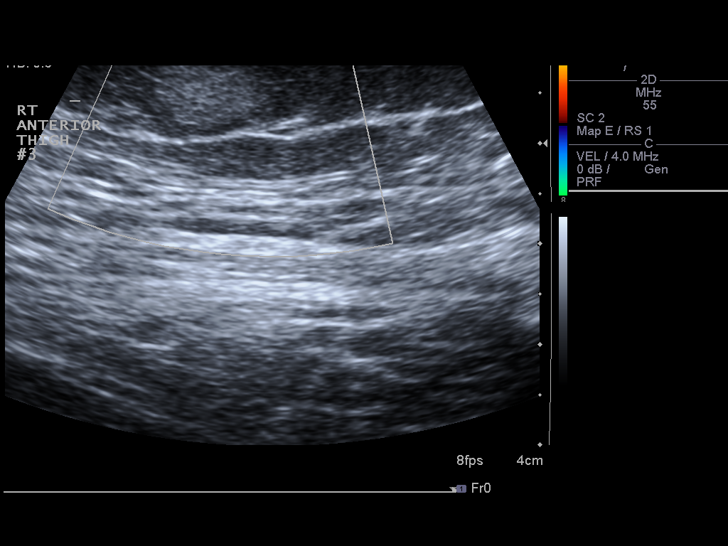

[14 of 24 positions shown; findings below may reference images not displayed]

FINDINGS: There are three hyperechoic areas within the subcutaneous
soft tissues of the anterior right thigh.  The palpable area
measures 3.0 x 2.5 x 1.6 cm.  Superior to this nodule, there is a
separate hyperechoic nodule which measures 2.1 x 2.6 x 1.0 cm.
Superior and medial to the first nodule there is a hyperechoic
nodule which measures 1.4 x 1.3 x 0.6 cm.  These are nonspecific,
but the hyperechoic appearance suggest lipomas.  No cystic masses
or other abnormality.
IMPRESSION: Three hyperechoic nodules within the right thigh subcutaneous soft
tissues.  Favor lipomas.  Recommend clinical correlation and follow-
up clinically to assure stability.

## 2015-01-31 ENCOUNTER — Telehealth: Payer: Self-pay | Admitting: Pharmacist Clinician (PhC)/ Clinical Pharmacy Specialist

## 2015-01-31 NOTE — Telephone Encounter (Signed)
Patient is returning your call.  

## 2015-01-31 NOTE — Telephone Encounter (Signed)
-----   Message from Leonie Man, MD sent at 01/30/2015  1:38 PM EDT ----- 2 stable liver and kidney function. Glucose level was elevated suggesting glucose intolerance. Lipid panel is essentially this stable from 5 months ago with total cholesterol back up to 178 HDL better at 37. LDL is stable at 1:15. I do want her back on pravastatin. If we are not able to get good control with this, we may need to consider more advanced therapy like PC SK 9.  We'll forward to [place].  Bunk Foss

## 2015-01-31 NOTE — Telephone Encounter (Signed)
Reviewed meds with patient,  Encouraged her to increase pravastatin to 40 mg each night, pt very hesitant.  For now she will increase to 40 mg qod/20 mg qod.  She is afraid it will cause her to feel weak and low energy.  Complained about lack of energy, blames medications and states she doesn't have enough energy to clean house.  Told her that side effect of pravastatin is more muscle pain, not overall body weakness, but if she feels it worsens, she is to stop for 1 week then go back to 20 mg daily.

## 2015-01-31 NOTE — Telephone Encounter (Signed)
LMOM for patient to return call regarding cholesterol medication

## 2015-02-05 ENCOUNTER — Encounter (HOSPITAL_COMMUNITY): Payer: Self-pay | Admitting: *Deleted

## 2015-02-05 ENCOUNTER — Telehealth: Payer: Self-pay | Admitting: Internal Medicine

## 2015-02-05 ENCOUNTER — Emergency Department (HOSPITAL_COMMUNITY)
Admission: EM | Admit: 2015-02-05 | Discharge: 2015-02-06 | Disposition: A | Discharge status: Home / Self Care | Payer: Medicare Other | Attending: Emergency Medicine | Admitting: Emergency Medicine

## 2015-02-05 DIAGNOSIS — N189 Chronic kidney disease, unspecified: Secondary | ICD-10-CM | POA: Insufficient documentation

## 2015-02-05 DIAGNOSIS — I25119 Atherosclerotic heart disease of native coronary artery with unspecified angina pectoris: Secondary | ICD-10-CM | POA: Insufficient documentation

## 2015-02-05 DIAGNOSIS — F419 Anxiety disorder, unspecified: Secondary | ICD-10-CM | POA: Diagnosis not present

## 2015-02-05 DIAGNOSIS — K644 Residual hemorrhoidal skin tags: Secondary | ICD-10-CM | POA: Insufficient documentation

## 2015-02-05 DIAGNOSIS — I129 Hypertensive chronic kidney disease with stage 1 through stage 4 chronic kidney disease, or unspecified chronic kidney disease: Secondary | ICD-10-CM | POA: Insufficient documentation

## 2015-02-05 DIAGNOSIS — G8929 Other chronic pain: Secondary | ICD-10-CM | POA: Diagnosis not present

## 2015-02-05 DIAGNOSIS — K219 Gastro-esophageal reflux disease without esophagitis: Secondary | ICD-10-CM | POA: Diagnosis not present

## 2015-02-05 DIAGNOSIS — Z7902 Long term (current) use of antithrombotics/antiplatelets: Secondary | ICD-10-CM | POA: Insufficient documentation

## 2015-02-05 DIAGNOSIS — Z8619 Personal history of other infectious and parasitic diseases: Secondary | ICD-10-CM | POA: Diagnosis not present

## 2015-02-05 DIAGNOSIS — J449 Chronic obstructive pulmonary disease, unspecified: Secondary | ICD-10-CM | POA: Diagnosis not present

## 2015-02-05 DIAGNOSIS — Z720 Tobacco use: Secondary | ICD-10-CM | POA: Insufficient documentation

## 2015-02-05 DIAGNOSIS — Z79899 Other long term (current) drug therapy: Secondary | ICD-10-CM | POA: Insufficient documentation

## 2015-02-05 DIAGNOSIS — E785 Hyperlipidemia, unspecified: Secondary | ICD-10-CM | POA: Diagnosis not present

## 2015-02-05 DIAGNOSIS — F329 Major depressive disorder, single episode, unspecified: Secondary | ICD-10-CM | POA: Diagnosis not present

## 2015-02-05 DIAGNOSIS — K625 Hemorrhage of anus and rectum: Secondary | ICD-10-CM | POA: Diagnosis present

## 2015-02-05 LAB — COMPREHENSIVE METABOLIC PANEL
ALT: 11 U/L — ABNORMAL LOW (ref 14–54)
AST: 22 U/L (ref 15–41)
Albumin: 3.6 g/dL (ref 3.5–5.0)
Alkaline Phosphatase: 53 U/L (ref 38–126)
Anion gap: 6 (ref 5–15)
BUN: 10 mg/dL (ref 6–20)
CO2: 28 mmol/L (ref 22–32)
Calcium: 9 mg/dL (ref 8.9–10.3)
Chloride: 105 mmol/L (ref 101–111)
Creatinine, Ser: 0.74 mg/dL (ref 0.44–1.00)
GFR calc Af Amer: >60 mL/min (ref >60)
GFR calc non Af Amer: >60 mL/min (ref >60)
Glucose, Bld: 88 mg/dL (ref 65–99)
Potassium: 4.2 mmol/L (ref 3.5–5.1)
Sodium: 139 mmol/L (ref 135–145)
Total Bilirubin: <0.1 mg/dL — ABNORMAL LOW (ref 0.3–1.2)
Total Protein: 6.4 g/dL — ABNORMAL LOW (ref 6.5–8.1)

## 2015-02-05 LAB — CBC
HCT: 41.2 % (ref 36.0–46.0)
Hemoglobin: 13.5 g/dL (ref 12.0–15.0)
MCH: 29.9 pg (ref 26.0–34.0)
MCHC: 32.8 g/dL (ref 30.0–36.0)
MCV: 91.2 fL (ref 78.0–100.0)
Platelets: 170 10*3/uL (ref 150–400)
RBC: 4.52 MIL/uL (ref 3.87–5.11)
RDW: 13.5 % (ref 11.5–15.5)
WBC: 5 10*3/uL (ref 4.0–10.5)

## 2015-02-05 LAB — URINALYSIS, ROUTINE W REFLEX MICROSCOPIC
Bilirubin Urine: NEGATIVE
Glucose, UA: NEGATIVE mg/dL
Ketones, ur: NEGATIVE mg/dL
Leukocytes, UA: NEGATIVE
Nitrite: NEGATIVE
Protein, ur: NEGATIVE mg/dL
Specific Gravity, Urine: 1.012 (ref 1.005–1.030)
Urobilinogen, UA: 0.2 mg/dL (ref 0.0–1.0)
pH: 6 (ref 5.0–8.0)

## 2015-02-05 LAB — URINE MICROSCOPIC-ADD ON

## 2015-02-05 LAB — LIPASE, BLOOD: Lipase: 31 U/L (ref 22–51)

## 2015-02-05 NOTE — Telephone Encounter (Signed)
Michele [name] called and said she had bright red blood and blood clots in her stool this evening. She stated it was quite a bit of blood. She says she takes a blood thinner (plavix). Otherwise, she feels ok, no dizziness/lightheadedness. This is new for her. We discussed calling 911/coming to the ER based on these findings which she agreed to do.

## 2015-02-05 NOTE — ED Notes (Signed)
Patient states she was taking a nap and got up to go to the BR  Noticed blood in her panties.  Voided and noticed blood.  Wiped her buttock and noticed blood and blood clots.  Also c/o right sided abd pain that radiates around the bottom of her abd

## 2015-02-06 DIAGNOSIS — K644 Residual hemorrhoidal skin tags: Secondary | ICD-10-CM | POA: Diagnosis not present

## 2015-02-06 LAB — POC OCCULT BLOOD, ED: Fecal Occult Bld: POSITIVE — AB
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
## 2015-02-06 NOTE — Discharge Instructions (Signed)
I recommend that you increase your water and fiber intake to keep your stool softener to help your hemorrhoids. You may also use over-the-counter Preparation H and Tuck's witch hazel wipes. You may use MiraLAX once a day and Colace 100 mg tablets twice a day to keep your stool soft as well. Please hold these medications if you have diarrhea. Please follow-up with your gastroenterologist as scheduled. Your labs today were normal. Your blood counts were normal.    Hemorrhoids Hemorrhoids are swollen veins around the rectum or anus. There are two types of hemorrhoids:   Internal hemorrhoids. These occur in the veins just inside the rectum. They may poke through to the outside and become irritated and painful.  External hemorrhoids. These occur in the veins outside the anus and can be felt as a painful swelling or hard lump near the anus. CAUSES  Pregnancy.   Obesity.   Constipation or diarrhea.   Straining to have a bowel movement.   Sitting for long periods on the toilet.  Heavy lifting or other activity that caused you to strain.  Anal intercourse. SYMPTOMS   Pain.   Anal itching or irritation.   Rectal bleeding.   Fecal leakage.   Anal swelling.   One or more lumps around the anus.  DIAGNOSIS  Your caregiver may be able to diagnose hemorrhoids by visual examination. Other examinations or tests that may be performed include:   Examination of the rectal area with a gloved hand (digital rectal exam).   Examination of anal canal using a small tube (scope).   A blood test if you have lost a significant amount of blood.  A test to look inside the colon (sigmoidoscopy or colonoscopy). TREATMENT Most hemorrhoids can be treated at home. However, if symptoms do not seem to be getting better or if you have a lot of rectal bleeding, your caregiver may perform a procedure to help make the hemorrhoids get smaller or remove them completely. Possible treatments include:     Placing a rubber band at the base of the hemorrhoid to cut off the circulation (rubber band ligation).   Injecting a chemical to shrink the hemorrhoid (sclerotherapy).   Using a tool to burn the hemorrhoid (infrared light therapy).   Surgically removing the hemorrhoid (hemorrhoidectomy).   Stapling the hemorrhoid to block blood flow to the tissue (hemorrhoid stapling).  HOME CARE INSTRUCTIONS   Eat foods with fiber, such as whole grains, beans, nuts, fruits, and vegetables. Ask your doctor about taking products with added fiber in them (fibersupplements).  Increase fluid intake. Drink enough water and fluids to keep your urine clear or pale yellow.   Exercise regularly.   Go to the bathroom when you have the urge to have a bowel movement. Do not wait.   Avoid straining to have bowel movements.   Keep the anal area dry and clean. Use wet toilet paper or moist towelettes after a bowel movement.   Medicated creams and suppositories may be used or applied as directed.   Only take over-the-counter or prescription medicines as directed by your caregiver.   Take warm sitz baths for 15-20 minutes, 3-4 times a day to ease pain and discomfort.   Place ice packs on the hemorrhoids if they are tender and swollen. Using ice packs between sitz baths may be helpful.   Put ice in a plastic bag.   Place a towel between your skin and the bag.   Leave the ice on for 15-20 minutes, 3-4  times a day.   Do not use a donut-shaped pillow or sit on the toilet for long periods. This increases blood pooling and pain.  SEEK MEDICAL CARE IF:  You have increasing pain and swelling that is not controlled by treatment or medicine.  You have uncontrolled bleeding.  You have difficulty or you are unable to have a bowel movement.  You have pain or inflammation outside the area of the hemorrhoids. MAKE SURE YOU:  Understand these instructions.  Will watch your condition.  Will  get help right away if you are not doing well or get worse. Document Released: 05/17/2000 Document Revised: 05/06/2012 Document Reviewed: 03/24/2012 [facility] Patient Information 2015 [place], [place]. This information is not intended to replace advice given to you by your health care provider. Make sure you discuss any questions you have with your health care provider.   High-Fiber Diet Fiber is found in fruits, vegetables, and grains. A high-fiber diet encourages the addition of more whole grains, legumes, fruits, and vegetables in your diet. The recommended amount of fiber for adult males is 38 g per day. For adult females, it is 25 g per day. Pregnant and lactating women should get 28 g of fiber per day. If you have a digestive or bowel problem, ask your caregiver for advice before adding high-fiber foods to your diet. Eat a variety of high-fiber foods instead of only a select few type of foods.  PURPOSE  To increase stool bulk.  To make bowel movements more regular to prevent constipation.  To lower cholesterol.  To prevent overeating. WHEN IS THIS DIET USED?  It may be used if you have constipation and hemorrhoids.  It may be used if you have uncomplicated diverticulosis (intestine condition) and irritable bowel syndrome.  It may be used if you need help with weight management.  It may be used if you want to add it to your diet as a protective measure against atherosclerosis, diabetes, and cancer. SOURCES OF FIBER  Whole-grain breads and cereals.  Fruits, such as apples, oranges, bananas, berries, prunes, and pears.  Vegetables, such as green peas, carrots, sweet potatoes, beets, broccoli, cabbage, spinach, and artichokes.  Legumes, such split peas, soy, lentils.  Almonds. FIBER CONTENT IN FOODS Starches and Grains / Dietary Fiber (g)  Cheerios, 1 cup / 3 g  Corn Flakes cereal, 1 cup / 0.7 g  Rice crispy treat cereal, 1 cup / 0.3 g  Instant oatmeal (cooked),  cup /  2 g  Frosted wheat cereal, 1 cup / 5.1 g  Brown, long-grain rice (cooked), 1 cup / 3.5 g  White, long-grain rice (cooked), 1 cup / 0.6 g  Enriched macaroni (cooked), 1 cup / 2.5 g Legumes / Dietary Fiber (g)  Baked beans (canned, plain, or vegetarian),  cup / 5.2 g  Kidney beans (canned),  cup / 6.8 g  Pinto beans (cooked),  cup / 5.5 g Breads and Crackers / Dietary Fiber (g)  Plain or honey graham crackers, 2 squares / 0.7 g  Saltine crackers, 3 squares / 0.3 g  Plain, salted pretzels, 10 pieces / 1.8 g  Whole-wheat bread, 1 slice / 1.9 g  White bread, 1 slice / 0.7 g  Raisin bread, 1 slice / 1.2 g  Plain bagel, 3 oz / 2 g  Flour tortilla, 1 oz / 0.9 g  Corn tortilla, 1 small / 1.5 g  Hamburger or hotdog bun, 1 small / 0.9 g Fruits / Dietary Fiber (g)  Apple  with skin, 1 medium / 4.4 g  Sweetened applesauce,  cup / 1.5 g  Banana,  medium / 1.5 g  Grapes, 10 grapes / 0.4 g  Orange, 1 small / 2.3 g  Raisin, 1.5 oz / 1.6 g  Melon, 1 cup / 1.4 g Vegetables / Dietary Fiber (g)  Green beans (canned),  cup / 1.3 g  Carrots (cooked),  cup / 2.3 g  Broccoli (cooked),  cup / 2.8 g  Peas (cooked),  cup / 4.4 g  Mashed potatoes,  cup / 1.6 g  Lettuce, 1 cup / 0.5 g  Corn (canned),  cup / 1.6 g  Tomato,  cup / 1.1 g Document Released: 05/20/2005 Document Revised: 11/19/2011 Document Reviewed: 08/22/2011 [employer] Patient Information 2015 [place], [place]. This information is not intended to replace advice given to you by your health care provider. Make sure you discuss any questions you have with your health care provider.

## 2015-02-08 ENCOUNTER — Telehealth: Payer: Self-pay | Admitting: Cardiovascular Disease

## 2015-02-08 DIAGNOSIS — Z9861 Coronary angioplasty status: Principal | ICD-10-CM

## 2015-02-08 DIAGNOSIS — I2510 Atherosclerotic heart disease of native coronary artery without angina pectoris: Secondary | ICD-10-CM

## 2015-02-08 MED ORDER — PRAVASTATIN SODIUM 20 MG PO TABS: ORAL_TABLET | ORAL | Status: DC

## 2015-02-08 MED ORDER — ISOSORBIDE MONONITRATE ER 30 MG PO TB24: 30.0000 mg | ORAL_TABLET | Freq: Every day | ORAL | Status: DC

## 2015-02-08 NOTE — Telephone Encounter (Signed)
Spoke with patient. She wishes to take '30mg'$  imudr ONLY - this med was increased after add-on DOD visit 8/15 to '60mg'$ . Patient called in a few days after this with complaints of taking too much of this medication and was advised to take '30mg'$  BID.   She would like to know if this is OK to take only '30mg'$  - she does not complain of angina.   Will defer to MD to advise

## 2015-02-08 NOTE — Telephone Encounter (Signed)
Patient notified of MD's advice. Rx for imdur '30mg'$  sent to pharmacy. She also needs refill of pravastatin as she was told to increase this medication to '40mg'$  daily (she is going to try '40mg'$  alternating with '20mg'$ )

## 2015-02-08 NOTE — Telephone Encounter (Signed)
Fine -- take the dose she wants.  If she has CP - increase to 60 mg.  [place]

## 2015-02-08 NOTE — Telephone Encounter (Signed)
Pt called in stating that the strength for her Isosorbide needed to be changed. She says that the prescription was written for '60mg'$  and it needed to be changed to '30mg'$ . Can this be changed and then contact the pt to let her know that the prescription was changed.   Thanks

## 2015-02-10 DIAGNOSIS — J449 Chronic obstructive pulmonary disease, unspecified: Secondary | ICD-10-CM | POA: Diagnosis not present

## 2015-02-10 DIAGNOSIS — F419 Anxiety disorder, unspecified: Secondary | ICD-10-CM | POA: Diagnosis not present

## 2015-02-10 DIAGNOSIS — R739 Hyperglycemia, unspecified: Secondary | ICD-10-CM | POA: Diagnosis not present

## 2015-02-10 DIAGNOSIS — Z23 Encounter for immunization: Secondary | ICD-10-CM | POA: Diagnosis not present

## 2015-02-10 DIAGNOSIS — K219 Gastro-esophageal reflux disease without esophagitis: Secondary | ICD-10-CM | POA: Diagnosis not present

## 2015-02-10 DIAGNOSIS — E785 Hyperlipidemia, unspecified: Secondary | ICD-10-CM | POA: Diagnosis not present

## 2015-02-15 ENCOUNTER — Ambulatory Visit: Payer: Medicare Other

## 2015-02-23 ENCOUNTER — Encounter: Payer: Self-pay | Admitting: Cardiology

## 2015-02-23 ENCOUNTER — Ambulatory Visit (INDEPENDENT_AMBULATORY_CARE_PROVIDER_SITE_OTHER): Payer: Medicare Other | Admitting: Cardiology

## 2015-02-23 VITALS — BP 104/68 | HR 78 | Ht 65.5 in | Wt 151.9 lb

## 2015-02-23 DIAGNOSIS — Z9861 Coronary angioplasty status: Secondary | ICD-10-CM

## 2015-02-23 DIAGNOSIS — R002 Palpitations: Secondary | ICD-10-CM

## 2015-02-23 DIAGNOSIS — R5381 Other malaise: Secondary | ICD-10-CM

## 2015-02-23 DIAGNOSIS — I2510 Atherosclerotic heart disease of native coronary artery without angina pectoris: Secondary | ICD-10-CM

## 2015-02-23 DIAGNOSIS — I10 Essential (primary) hypertension: Secondary | ICD-10-CM

## 2015-02-23 DIAGNOSIS — T82855D Stenosis of coronary artery stent, subsequent encounter: Secondary | ICD-10-CM

## 2015-02-23 DIAGNOSIS — R5382 Chronic fatigue, unspecified: Secondary | ICD-10-CM

## 2015-02-23 DIAGNOSIS — Z79899 Other long term (current) drug therapy: Secondary | ICD-10-CM | POA: Diagnosis not present

## 2015-02-23 DIAGNOSIS — E785 Hyperlipidemia, unspecified: Secondary | ICD-10-CM

## 2015-02-23 DIAGNOSIS — I208 Other forms of angina pectoris: Secondary | ICD-10-CM | POA: Diagnosis not present

## 2015-02-23 DIAGNOSIS — Z720 Tobacco use: Secondary | ICD-10-CM

## 2015-02-23 DIAGNOSIS — T82857D Stenosis of cardiac prosthetic devices, implants and grafts, subsequent encounter: Secondary | ICD-10-CM

## 2015-02-23 MED ORDER — PRAVASTATIN SODIUM 40 MG PO TABS: 40.0000 mg | ORAL_TABLET | Freq: Every evening | ORAL | Status: DC

## 2015-02-23 NOTE — Patient Instructions (Addendum)
INCREASE PRAVASTATIN TO 40 MG DAILY  LABS---LIPID , HEPATIC JAN 2017-WILL MAIL LABSLIP  STOP SMOKING  -Your physician recommends that you schedule a follow-up appointment in [place]
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
## 2015-02-24 DIAGNOSIS — I2510 Atherosclerotic heart disease of native coronary artery without angina pectoris: Secondary | ICD-10-CM | POA: Diagnosis not present

## 2015-02-24 DIAGNOSIS — R079 Chest pain, unspecified: Secondary | ICD-10-CM | POA: Diagnosis not present

## 2015-02-24 DIAGNOSIS — R05 Cough: Secondary | ICD-10-CM | POA: Diagnosis not present

## 2015-02-24 DIAGNOSIS — F17210 Nicotine dependence, cigarettes, uncomplicated: Secondary | ICD-10-CM | POA: Diagnosis not present

## 2015-02-25 ENCOUNTER — Encounter: Payer: Self-pay | Admitting: Cardiology

## 2015-02-25 ENCOUNTER — Other Ambulatory Visit: Payer: Self-pay | Admitting: Cardiology

## 2015-02-25 DIAGNOSIS — I208 Other forms of angina pectoris: Secondary | ICD-10-CM | POA: Insufficient documentation

## 2015-02-25 DIAGNOSIS — T82855A Stenosis of coronary artery stent, initial encounter: Secondary | ICD-10-CM | POA: Insufficient documentation

## 2015-02-25 NOTE — Assessment & Plan Note (Addendum)
Lungs overall look okay with the exception of LDL being somewhere between 9215. For her she definitely needs aggressive reduction. On the chart increase her Pravachol to 40 mg daily. She has significant she can hold a dose for 1 day.  She should be due for labs to be checked early next year.[name]

## 2015-02-25 NOTE — Assessment & Plan Note (Signed)
She simply just cannot tolerate any significant dose of antihypertensive.  Blood pressure is borderline low today, indicating that she would not tolerate much more "medical management if symptoms persist. Thankfully, she is on good dose of carvedilol.

## 2015-02-25 NOTE — Assessment & Plan Note (Signed)
Her stress test shows it as would be expected they inferolateral infarct with minimal ischemia. Low risk and therefore not likely benefit from a really catheterization as we know that this is consistent with the distribution where the circumflex Dorian Pod stents are occluded. I would not propose to go back to the Cath Lab base of the finger. There is no significant ischemia elsewhere. Despite this, she quite likely could have microvascular disease with her dyslipidemia and smoking. I tried the Nexium which she would not tolerate we tried increasing her Imdur dose which she would not tolerate. Now since she is not having any anginal symptoms, and the best plan would be to continue to monitor her. If she required sublingual nitroglycerin for as needed chest pain, I recommended that she simply just takes a higher dose of Imdur for the next several days to keep things calm down.

## 2015-02-25 NOTE — Assessment & Plan Note (Signed)
I actually did spend almost 10 minutes with her talking not only about smoking cessation but also her lipid management. I was firm with no isolation is unlikely concerns. I encouraged her to become more common of her clinical issues as far as health maintenance and not worrisome much about nonspecific symptoms.

## 2015-02-25 NOTE — Assessment & Plan Note (Signed)
She pretty much is occluded the bifurcation lesion/PCI site in the circumflex flex either limb. The other stents looked pretty good. Head very frank discussion with her about the importance of her stopping smoking and continue with the medications as she seems to period in addition to this she needs to become more active in order to ambulate and truly evaluate her anginal symptoms. I explained to her that there is a direct correlation between her continued smoking and stent stenosis.

## 2015-02-25 NOTE — Assessment & Plan Note (Signed)
Multiple stents with now occluded stents in the circumflex suspicion that was showing Myoview stress test. She is on Plavix because duration since her last stents. Her inhibition assay was not too bad. Maintain low dose as for my mononitrate along with carvedilol. She is at least on a statin.   Myoview was pretty much consistent with known anatomy. No other ischemia. - cannot exclude microvascular ischemia

## 2015-02-25 NOTE — Assessment & Plan Note (Signed)
Another topic that I was very forceful with her about was the fact that she absolutely must get out and do some exercise.she has multiple different complaints and concerns that all build up, but she just simply have to go through them. She is not having any real active anginal type symptoms that would suggest the reason why she does not want to exercise.

## 2015-02-25 NOTE — Assessment & Plan Note (Signed)
On a beta blocker at the best dosel we are able to achieve with her bradycardia.

## 2015-03-03 DIAGNOSIS — J209 Acute bronchitis, unspecified: Secondary | ICD-10-CM | POA: Diagnosis not present

## 2015-03-03 DIAGNOSIS — J029 Acute pharyngitis, unspecified: Secondary | ICD-10-CM | POA: Diagnosis not present

## 2015-03-18 IMAGING — CR DG CHEST 2V
2 series · 2 of 2 positions shown · non-contrast
Comparison: Chest radiograph 08/29/2012

CLINICAL DATA: Chest pain, shortness of breath, nausea to

CHEST - 2 VIEW

[w chest pa]
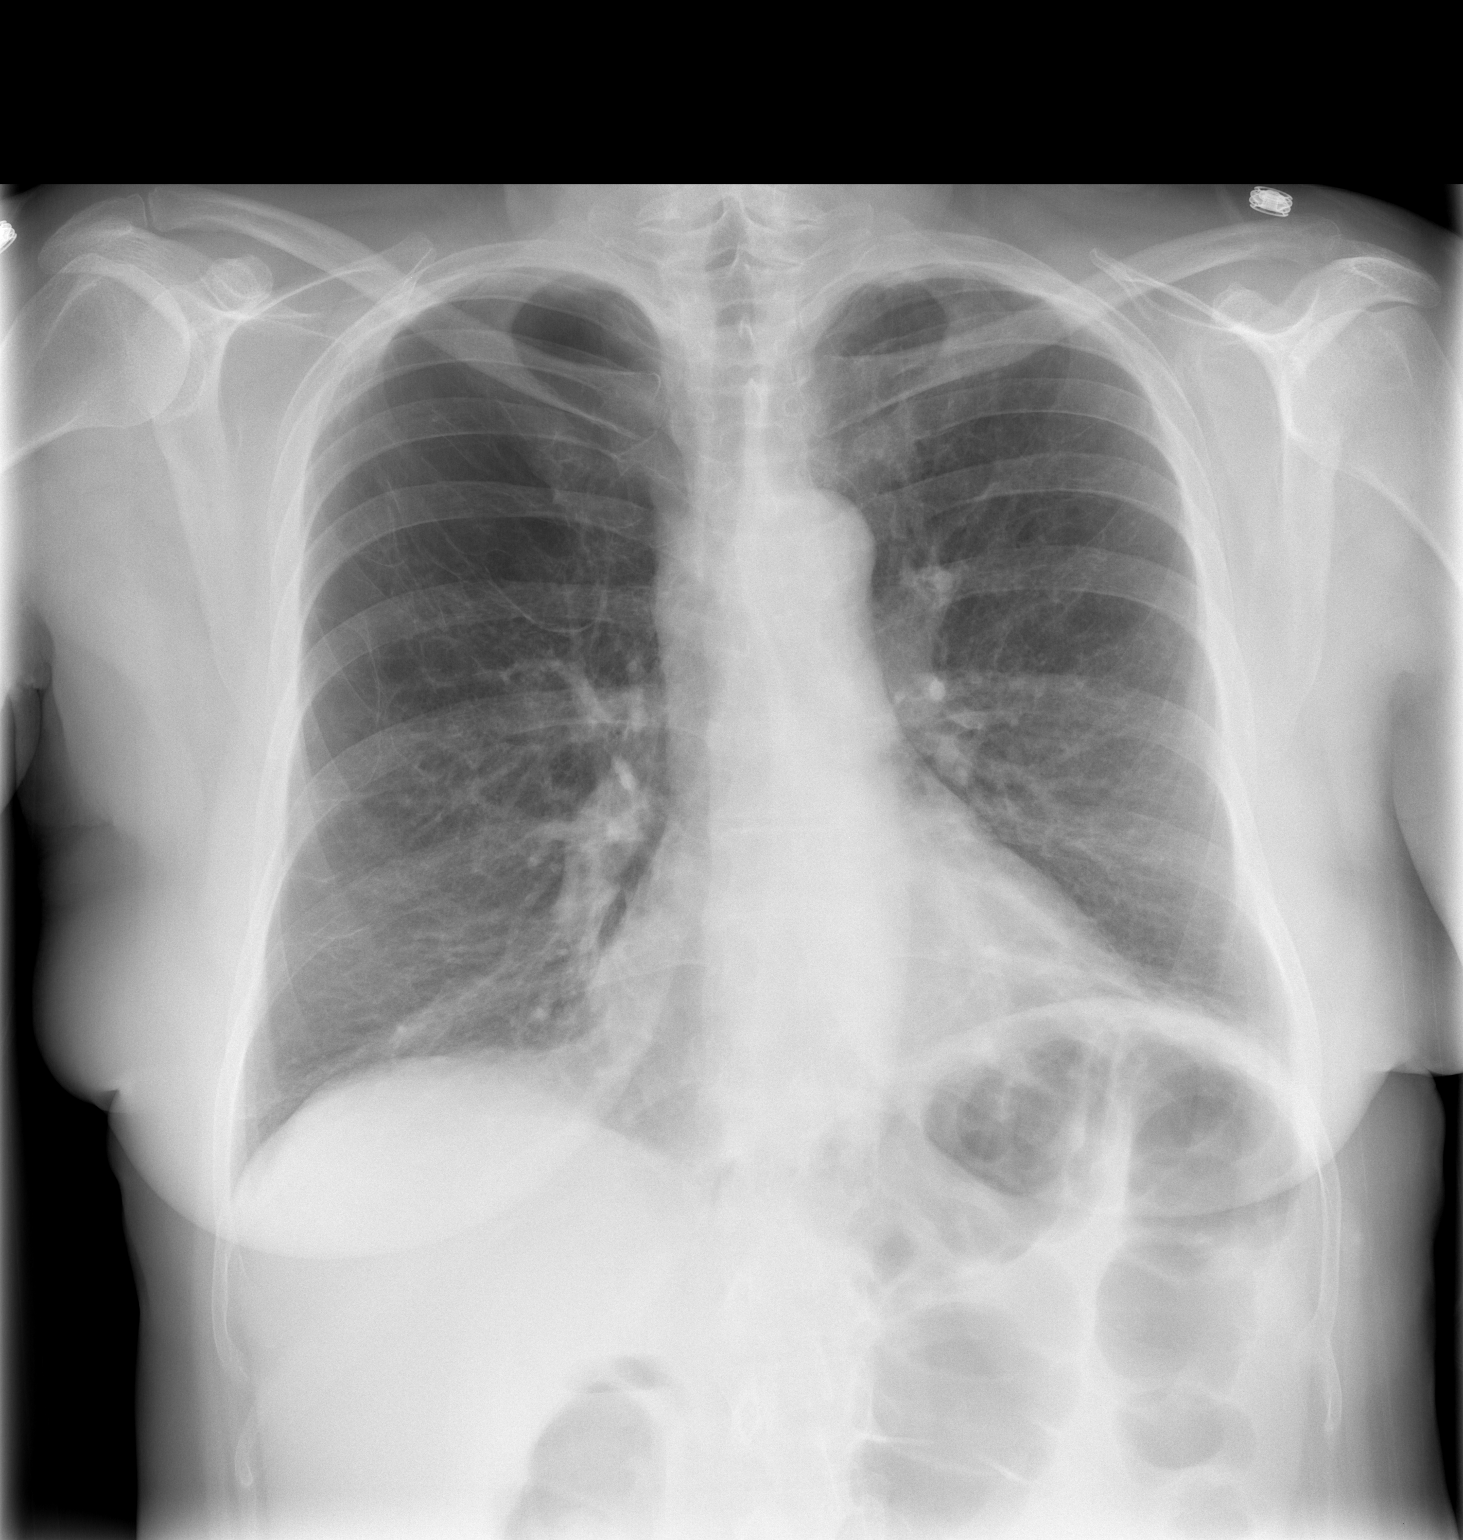

[w chest lat]
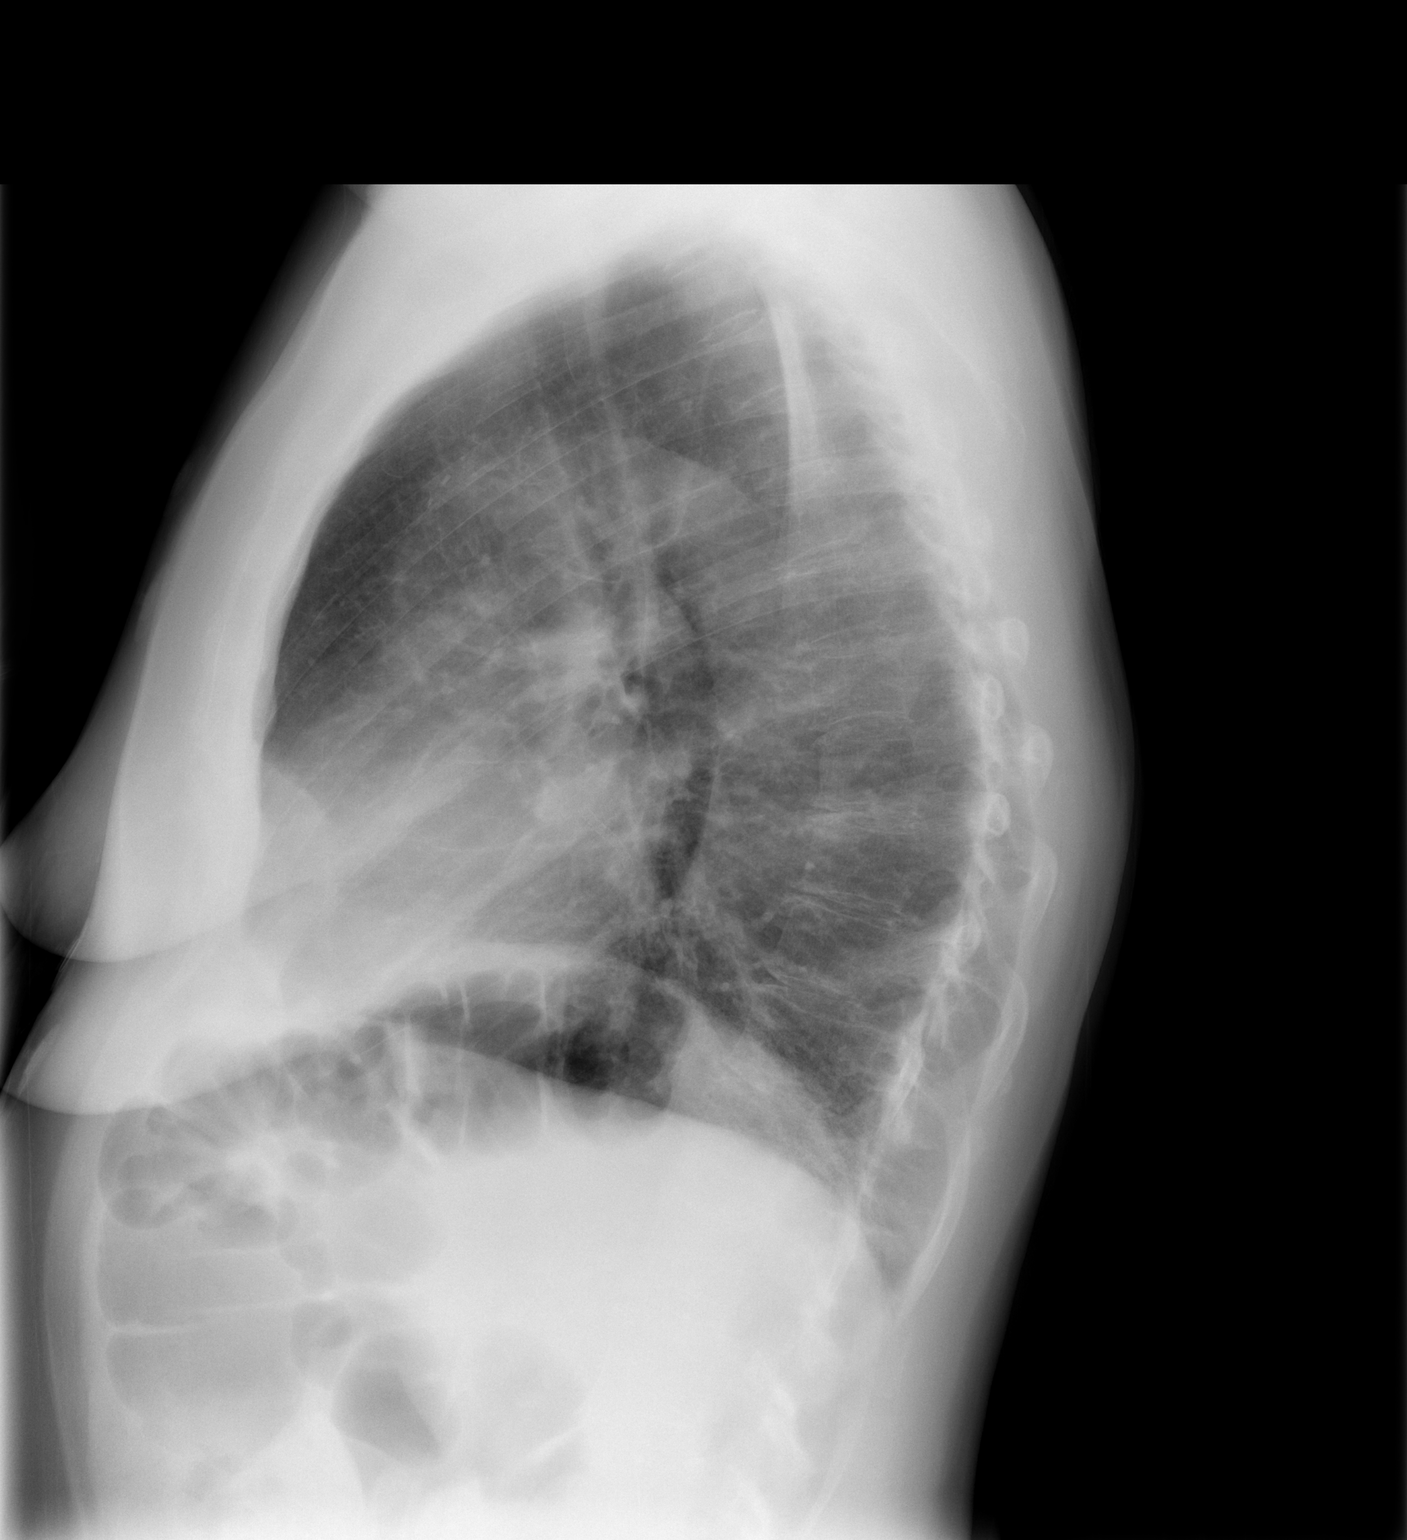

[2 of 2 positions shown; findings below may reference images not displayed]

FINDINGS: Normal heart and mediastinal contours.  Stable mild
prominence of the central pulmonary arteries.  There are
emphysematous changes bilaterally, with apical bullous changes on
the right.  Diffuse mild peribronchial thickening is stable.  No
airspace disease, effusion, or pneumothorax.

Bones appear demineralized.  No acute osseous abnormality is
identified.
IMPRESSION: COPD with apical bullous changes on the right.  No acute
superimposed abnormality.

## 2015-03-20 DIAGNOSIS — J441 Chronic obstructive pulmonary disease with (acute) exacerbation: Secondary | ICD-10-CM | POA: Diagnosis not present

## 2015-03-20 DIAGNOSIS — F17200 Nicotine dependence, unspecified, uncomplicated: Secondary | ICD-10-CM | POA: Diagnosis not present

## 2015-03-20 DIAGNOSIS — J069 Acute upper respiratory infection, unspecified: Secondary | ICD-10-CM | POA: Diagnosis not present

## 2015-03-29 ENCOUNTER — Ambulatory Visit: Payer: Medicare Other

## 2015-04-19 ENCOUNTER — Telehealth: Payer: Self-pay | Admitting: Cardiology

## 2015-04-19 NOTE — Telephone Encounter (Signed)
Pt called in stating that her lab orders were supposed to be mailed to her home and she never received them. She also said that she was told the she needs f/u with an appt in December but I see in his AVS that Dr. [name] wants him to f/u in March.  Thanks

## 2015-04-19 NOTE — Telephone Encounter (Signed)
Discussed w/ patient, lab slips mailed, pt aware.

## 2015-04-19 NOTE — Telephone Encounter (Signed)
Called, no answer.  Labs orders are in system - if she gets these drawn at [facility] and does not need paper slips  Recommended to do labs in January 2017, be seen for 6 mo f/u March 2017

## 2015-05-02 ENCOUNTER — Ambulatory Visit
Admission: RE | Admit: 2015-05-02 | Discharge: 2015-05-02 | Disposition: A | Discharge status: Home / Self Care | Payer: Medicare Other | Source: Ambulatory Visit

## 2015-05-02 DIAGNOSIS — Z1231 Encounter for screening mammogram for malignant neoplasm of breast: Secondary | ICD-10-CM | POA: Diagnosis not present

## 2015-05-04 DIAGNOSIS — N281 Cyst of kidney, acquired: Secondary | ICD-10-CM | POA: Diagnosis not present

## 2015-05-04 DIAGNOSIS — N3020 Other chronic cystitis without hematuria: Secondary | ICD-10-CM | POA: Diagnosis not present

## 2015-05-04 DIAGNOSIS — N393 Stress incontinence (female) (male): Secondary | ICD-10-CM | POA: Diagnosis not present

## 2015-05-04 DIAGNOSIS — N952 Postmenopausal atrophic vaginitis: Secondary | ICD-10-CM | POA: Diagnosis not present

## 2015-05-09 ENCOUNTER — Other Ambulatory Visit: Payer: Self-pay | Admitting: Cardiology

## 2015-05-09 DIAGNOSIS — E785 Hyperlipidemia, unspecified: Secondary | ICD-10-CM | POA: Diagnosis not present

## 2015-05-09 DIAGNOSIS — Z79899 Other long term (current) drug therapy: Secondary | ICD-10-CM | POA: Diagnosis not present

## 2015-05-09 LAB — LIPID PANEL
Cholesterol: 150 mg/dL (ref 125–200)
HDL: 32 mg/dL — ABNORMAL LOW (ref >=46)
LDL Cholesterol: 92 mg/dL (ref <130)
Total CHOL/HDL Ratio: 4.7 Ratio (ref <=5.0)
Triglycerides: 128 mg/dL (ref <150)
VLDL: 26 mg/dL (ref <30)

## 2015-05-24 ENCOUNTER — Telehealth: Payer: Self-pay | Admitting: *Deleted

## 2015-05-24 DIAGNOSIS — Z79899 Other long term (current) drug therapy: Secondary | ICD-10-CM

## 2015-05-24 DIAGNOSIS — E785 Hyperlipidemia, unspecified: Secondary | ICD-10-CM

## 2015-05-24 NOTE — Telephone Encounter (Signed)
JAN 2017 LABS CANCELLED

## 2015-05-24 NOTE — Telephone Encounter (Signed)
-----   Message from Raiford Simmonds, RN sent at 02/23/2015 12:17 PM EDT ----- LIPID , HEPATIC IN JAN 2017  MAIL IN DEC 2016

## 2015-05-24 NOTE — Telephone Encounter (Signed)
patient does not need labs done in jan 2017 She had lab drawn in dec 2016

## 2015-05-26 ENCOUNTER — Telehealth: Payer: Self-pay | Admitting: *Deleted

## 2015-05-26 DIAGNOSIS — I10 Essential (primary) hypertension: Secondary | ICD-10-CM

## 2015-05-26 DIAGNOSIS — E785 Hyperlipidemia, unspecified: Secondary | ICD-10-CM

## 2015-05-26 DIAGNOSIS — Z79899 Other long term (current) drug therapy: Secondary | ICD-10-CM

## 2015-05-26 DIAGNOSIS — R002 Palpitations: Secondary | ICD-10-CM

## 2015-05-26 NOTE — Telephone Encounter (Signed)
Spoke to patient. Result given . Verbalized understanding Will mail lab slip in 3 months

## 2015-05-26 NOTE — Telephone Encounter (Signed)
-----   Message from Leonie Man, MD sent at 05/25/2015  9:40 PM EST ----- Cholesterol level looks better with Pravachol 40 mg.   Lets continue current course for now - recheck in 3 months.  Leonie Man, MD

## 2015-05-26 NOTE — Telephone Encounter (Signed)
Left to call back- labs

## 2015-06-06 ENCOUNTER — Emergency Department (HOSPITAL_COMMUNITY)
Admission: EM | Admit: 2015-06-06 | Discharge: 2015-06-06 | Disposition: A | Discharge status: Home / Self Care | Payer: Medicare Other | Attending: Emergency Medicine | Admitting: Emergency Medicine

## 2015-06-06 ENCOUNTER — Encounter (HOSPITAL_COMMUNITY): Payer: Self-pay | Admitting: Emergency Medicine

## 2015-06-06 ENCOUNTER — Emergency Department (HOSPITAL_COMMUNITY): Payer: Medicare Other

## 2015-06-06 DIAGNOSIS — R110 Nausea: Secondary | ICD-10-CM | POA: Diagnosis not present

## 2015-06-06 DIAGNOSIS — I129 Hypertensive chronic kidney disease with stage 1 through stage 4 chronic kidney disease, or unspecified chronic kidney disease: Secondary | ICD-10-CM | POA: Diagnosis not present

## 2015-06-06 DIAGNOSIS — K219 Gastro-esophageal reflux disease without esophagitis: Secondary | ICD-10-CM | POA: Insufficient documentation

## 2015-06-06 DIAGNOSIS — M791 Myalgia: Secondary | ICD-10-CM | POA: Diagnosis not present

## 2015-06-06 DIAGNOSIS — F419 Anxiety disorder, unspecified: Secondary | ICD-10-CM | POA: Diagnosis not present

## 2015-06-06 DIAGNOSIS — E559 Vitamin D deficiency, unspecified: Secondary | ICD-10-CM | POA: Insufficient documentation

## 2015-06-06 DIAGNOSIS — Z9889 Other specified postprocedural states: Secondary | ICD-10-CM | POA: Insufficient documentation

## 2015-06-06 DIAGNOSIS — I252 Old myocardial infarction: Secondary | ICD-10-CM | POA: Diagnosis not present

## 2015-06-06 DIAGNOSIS — N189 Chronic kidney disease, unspecified: Secondary | ICD-10-CM | POA: Insufficient documentation

## 2015-06-06 DIAGNOSIS — R069 Unspecified abnormalities of breathing: Secondary | ICD-10-CM | POA: Diagnosis not present

## 2015-06-06 DIAGNOSIS — Z872 Personal history of diseases of the skin and subcutaneous tissue: Secondary | ICD-10-CM | POA: Diagnosis not present

## 2015-06-06 DIAGNOSIS — Z79899 Other long term (current) drug therapy: Secondary | ICD-10-CM | POA: Diagnosis not present

## 2015-06-06 DIAGNOSIS — I25119 Atherosclerotic heart disease of native coronary artery with unspecified angina pectoris: Secondary | ICD-10-CM | POA: Insufficient documentation

## 2015-06-06 DIAGNOSIS — E785 Hyperlipidemia, unspecified: Secondary | ICD-10-CM | POA: Insufficient documentation

## 2015-06-06 DIAGNOSIS — Z9861 Coronary angioplasty status: Secondary | ICD-10-CM | POA: Insufficient documentation

## 2015-06-06 DIAGNOSIS — G8929 Other chronic pain: Secondary | ICD-10-CM | POA: Diagnosis not present

## 2015-06-06 DIAGNOSIS — Z7902 Long term (current) use of antithrombotics/antiplatelets: Secondary | ICD-10-CM | POA: Insufficient documentation

## 2015-06-06 DIAGNOSIS — Z86018 Personal history of other benign neoplasm: Secondary | ICD-10-CM | POA: Insufficient documentation

## 2015-06-06 DIAGNOSIS — J449 Chronic obstructive pulmonary disease, unspecified: Secondary | ICD-10-CM | POA: Diagnosis not present

## 2015-06-06 DIAGNOSIS — F17210 Nicotine dependence, cigarettes, uncomplicated: Secondary | ICD-10-CM | POA: Insufficient documentation

## 2015-06-06 DIAGNOSIS — R0789 Other chest pain: Secondary | ICD-10-CM | POA: Insufficient documentation

## 2015-06-06 DIAGNOSIS — R079 Chest pain, unspecified: Secondary | ICD-10-CM

## 2015-06-06 DIAGNOSIS — F329 Major depressive disorder, single episode, unspecified: Secondary | ICD-10-CM | POA: Diagnosis not present

## 2015-06-06 LAB — CBC
HCT: 41 % (ref 36.0–46.0)
Hemoglobin: 13.5 g/dL (ref 12.0–15.0)
MCH: 29.8 pg (ref 26.0–34.0)
MCHC: 32.9 g/dL (ref 30.0–36.0)
MCV: 90.5 fL (ref 78.0–100.0)
Platelets: 149 10*3/uL — ABNORMAL LOW (ref 150–400)
RBC: 4.53 MIL/uL (ref 3.87–5.11)
RDW: 13.5 % (ref 11.5–15.5)
WBC: 4.2 10*3/uL (ref 4.0–10.5)

## 2015-06-06 LAB — BASIC METABOLIC PANEL
Anion gap: 8 (ref 5–15)
BUN: 8 mg/dL (ref 6–20)
CO2: 26 mmol/L (ref 22–32)
Calcium: 9.2 mg/dL (ref 8.9–10.3)
Chloride: 108 mmol/L (ref 101–111)
Creatinine, Ser: 0.51 mg/dL (ref 0.44–1.00)
GFR calc Af Amer: >60 mL/min (ref >60)
GFR calc non Af Amer: >60 mL/min (ref >60)
Glucose, Bld: 85 mg/dL (ref 65–99)
Potassium: 4.2 mmol/L (ref 3.5–5.1)
Sodium: 142 mmol/L (ref 135–145)

## 2015-06-06 LAB — I-STAT TROPONIN, ED
Troponin i, poc: 0 ng/mL (ref 0.00–0.08)
Troponin i, poc: 0 ng/mL (ref 0.00–0.08)

## 2015-06-06 LAB — D-DIMER, QUANTITATIVE: D-Dimer, Quant: 0.44 ug/mL-FEU (ref 0.00–0.50)

## 2015-06-06 MED ORDER — OXYCODONE-ACETAMINOPHEN 5-325 MG PO TABS: 1.0000 | ORAL_TABLET | Freq: Four times a day (QID) | ORAL | Status: DC | PRN

## 2015-06-06 MED ORDER — OXYCODONE-ACETAMINOPHEN 5-325 MG PO TABS
1.0000 | ORAL_TABLET | Freq: Once | ORAL | Status: AC
  Administered 2015-06-06: 1 via ORAL
  Filled 2015-06-06: qty 1

## 2015-06-06 MED ORDER — FENTANYL CITRATE (PF) 100 MCG/2ML IJ SOLN
50.0000 ug | Freq: Once | INTRAMUSCULAR | Status: AC
  Administered 2015-06-06: 50 ug via INTRAVENOUS
  Filled 2015-06-06: qty 2
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
## 2015-06-06 NOTE — Discharge Instructions (Signed)
Nonspecific Chest Pain  °Chest pain can be caused by many different conditions. There is always a chance that your pain could be related to something serious, such as a heart attack or a blood clot in your lungs. Chest pain can also be caused by conditions that are not life-threatening. If you have chest pain, it is very important to follow up with your health care provider. °CAUSES  °Chest pain can be caused by: °· Heartburn. °· Pneumonia or bronchitis. °· Anxiety or stress. °· Inflammation around your heart (pericarditis) or lung (pleuritis or pleurisy). °· A blood clot in your lung. °· A collapsed lung (pneumothorax). It can develop suddenly on its own (spontaneous pneumothorax) or from trauma to the chest. °· Shingles infection (varicella-zoster virus). °· Heart attack. °· Damage to the bones, muscles, and cartilage that make up your chest wall. This can include: °¨ Bruised bones due to injury. °¨ Strained muscles or cartilage due to frequent or repeated coughing or overwork. °¨ Fracture to one or more ribs. °¨ Sore cartilage due to inflammation (costochondritis). °RISK FACTORS  °Risk factors for chest pain may include: °· Activities that increase your risk for trauma or injury to your chest. °· Respiratory infections or conditions that cause frequent coughing. °· Medical conditions or overeating that can cause heartburn. °· Heart disease or family history of heart disease. °· Conditions or health behaviors that increase your risk of developing a blood clot. °· Having had chicken pox (varicella zoster). °SIGNS AND SYMPTOMS °Chest pain can feel like: °· Burning or tingling on the surface of your chest or deep in your chest. °· Crushing, pressure, aching, or squeezing pain. °· Dull or sharp pain that is worse when you move, cough, or take a deep breath. °· Pain that is also felt in your back, neck, shoulder, or arm, or pain that spreads to any of these areas. °Your chest pain may come and go, or it may stay  constant. °DIAGNOSIS °Lab tests or other studies may be needed to find the cause of your pain. Your health care provider may have you take a test called an ambulatory ECG (electrocardiogram). An ECG records your heartbeat patterns at the time the test is performed. You may also have other tests, such as: °· Transthoracic echocardiogram (TTE). During echocardiography, sound waves are used to create a picture of all of the heart structures and to look at how blood flows through your heart. °· Transesophageal echocardiogram (TEE). This is a more advanced imaging test that obtains images from inside your body. It allows your health care provider to see your heart in finer detail. °· Cardiac monitoring. This allows your health care provider to monitor your heart rate and rhythm in real time. °· Holter monitor. This is a portable device that records your heartbeat and can help to diagnose abnormal heartbeats. It allows your health care provider to track your heart activity for several days, if needed. °· Stress tests. These can be done through exercise or by taking medicine that makes your heart beat more quickly. °· Blood tests. °· Imaging tests. °TREATMENT  °Your treatment depends on what is causing your chest pain. Treatment may include: °· Medicines. These may include: °¨ Acid blockers for heartburn. °¨ Anti-inflammatory medicine. °¨ Pain medicine for inflammatory conditions. °¨ Antibiotic medicine, if an infection is present. °¨ Medicines to dissolve blood clots. °¨ Medicines to treat coronary artery disease. °· Supportive care for conditions that do not require medicines. This may include: °¨ Resting. °¨ Applying heat   or cold packs to injured areas. °¨ Limiting activities until pain decreases. °HOME CARE INSTRUCTIONS °· If you were prescribed an antibiotic medicine, finish it all even if you start to feel better. °· Avoid any activities that bring on chest pain. °· Do not use any tobacco products, including  cigarettes, chewing tobacco, or electronic cigarettes. If you need help quitting, ask your health care provider. °· Do not drink alcohol. °· Take medicines only as directed by your health care provider. °· Keep all follow-up visits as directed by your health care provider. This is important. This includes any further testing if your chest pain does not go away. °· If heartburn is the cause for your chest pain, you may be told to keep your head raised (elevated) while sleeping. This reduces the chance that acid will go from your stomach into your esophagus. °· Make lifestyle changes as directed by your health care provider. These may include: °¨ Getting regular exercise. Ask your health care provider to suggest some activities that are safe for you. °¨ Eating a heart-healthy diet. A registered dietitian can help you to learn healthy eating options. °¨ Maintaining a healthy weight. °¨ Managing diabetes, if necessary. °¨ Reducing stress. °SEEK MEDICAL CARE IF: °· Your chest pain does not go away after treatment. °· You have a rash with blisters on your chest. °· You have a fever. °SEEK IMMEDIATE MEDICAL CARE IF:  °· Your chest pain is worse. °· You have an increasing cough, or you cough up blood. °· You have severe abdominal pain. °· You have severe weakness. °· You faint. °· You have chills. °· You have sudden, unexplained chest discomfort. °· You have sudden, unexplained discomfort in your arms, back, neck, or jaw. °· You have shortness of breath at any time. °· You suddenly start to sweat, or your skin gets clammy. °· You feel nauseous or you vomit. °· You suddenly feel light-headed or dizzy. °· Your heart begins to beat quickly, or it feels like it is skipping beats. °These symptoms may represent a serious problem that is an emergency. Do not wait to see if the symptoms will go away. Get medical help right away. Call your local emergency services (911 in the [place] Do not drive yourself to the hospital. °  °This  information is not intended to replace advice given to you by your health care provider. Make sure you discuss any questions you have with your health care provider. °  °Document Released: 02/27/2005 Document Revised: 06/10/2014 Document Reviewed: 12/24/2013 °Elsevier Interactive Patient Education ©2016 [employer] ° °

## 2015-06-06 NOTE — ED Notes (Signed)
Patient transported to X-ray 

## 2015-06-06 NOTE — ED Notes (Signed)
Patient verbalized understanding of discharge instructions and pain medication; denies any further needs or questions at this time. VS stable, patient ambulatory with steady gait. Assisted to ED entrance in wheelchair.

## 2015-06-06 NOTE — ED Notes (Signed)
Per [facility] patient has been having shortness of breath and chest pain since before Christmas, today it was worse and so she called EMS.  Patient states that the pain is primarily under under her left breast and is increased with coughing and palpation.  Patient was ambulatory on scene without difficulty and ambulated from the EMS stretcher to the ED stretcher.  Patient is in no apparent distress at this time, but is frequently asking for pain medication and expressing concern about being placed in the hallway.  Patient informed that she will be placed in aroom as soon as it becomes available.

## 2015-06-06 NOTE — ED Notes (Signed)
Pt called out requesting for pain meds, RN notified

## 2015-06-18 IMAGING — CR DG CHEST 2V
2 series · 2 of 2 positions shown · non-contrast
Comparison: CHEST x-ray 11/18/2012.

CLINICAL DATA: Pain under the left breast.

EXAM:
CHEST  2 VIEW

[w chest pa]
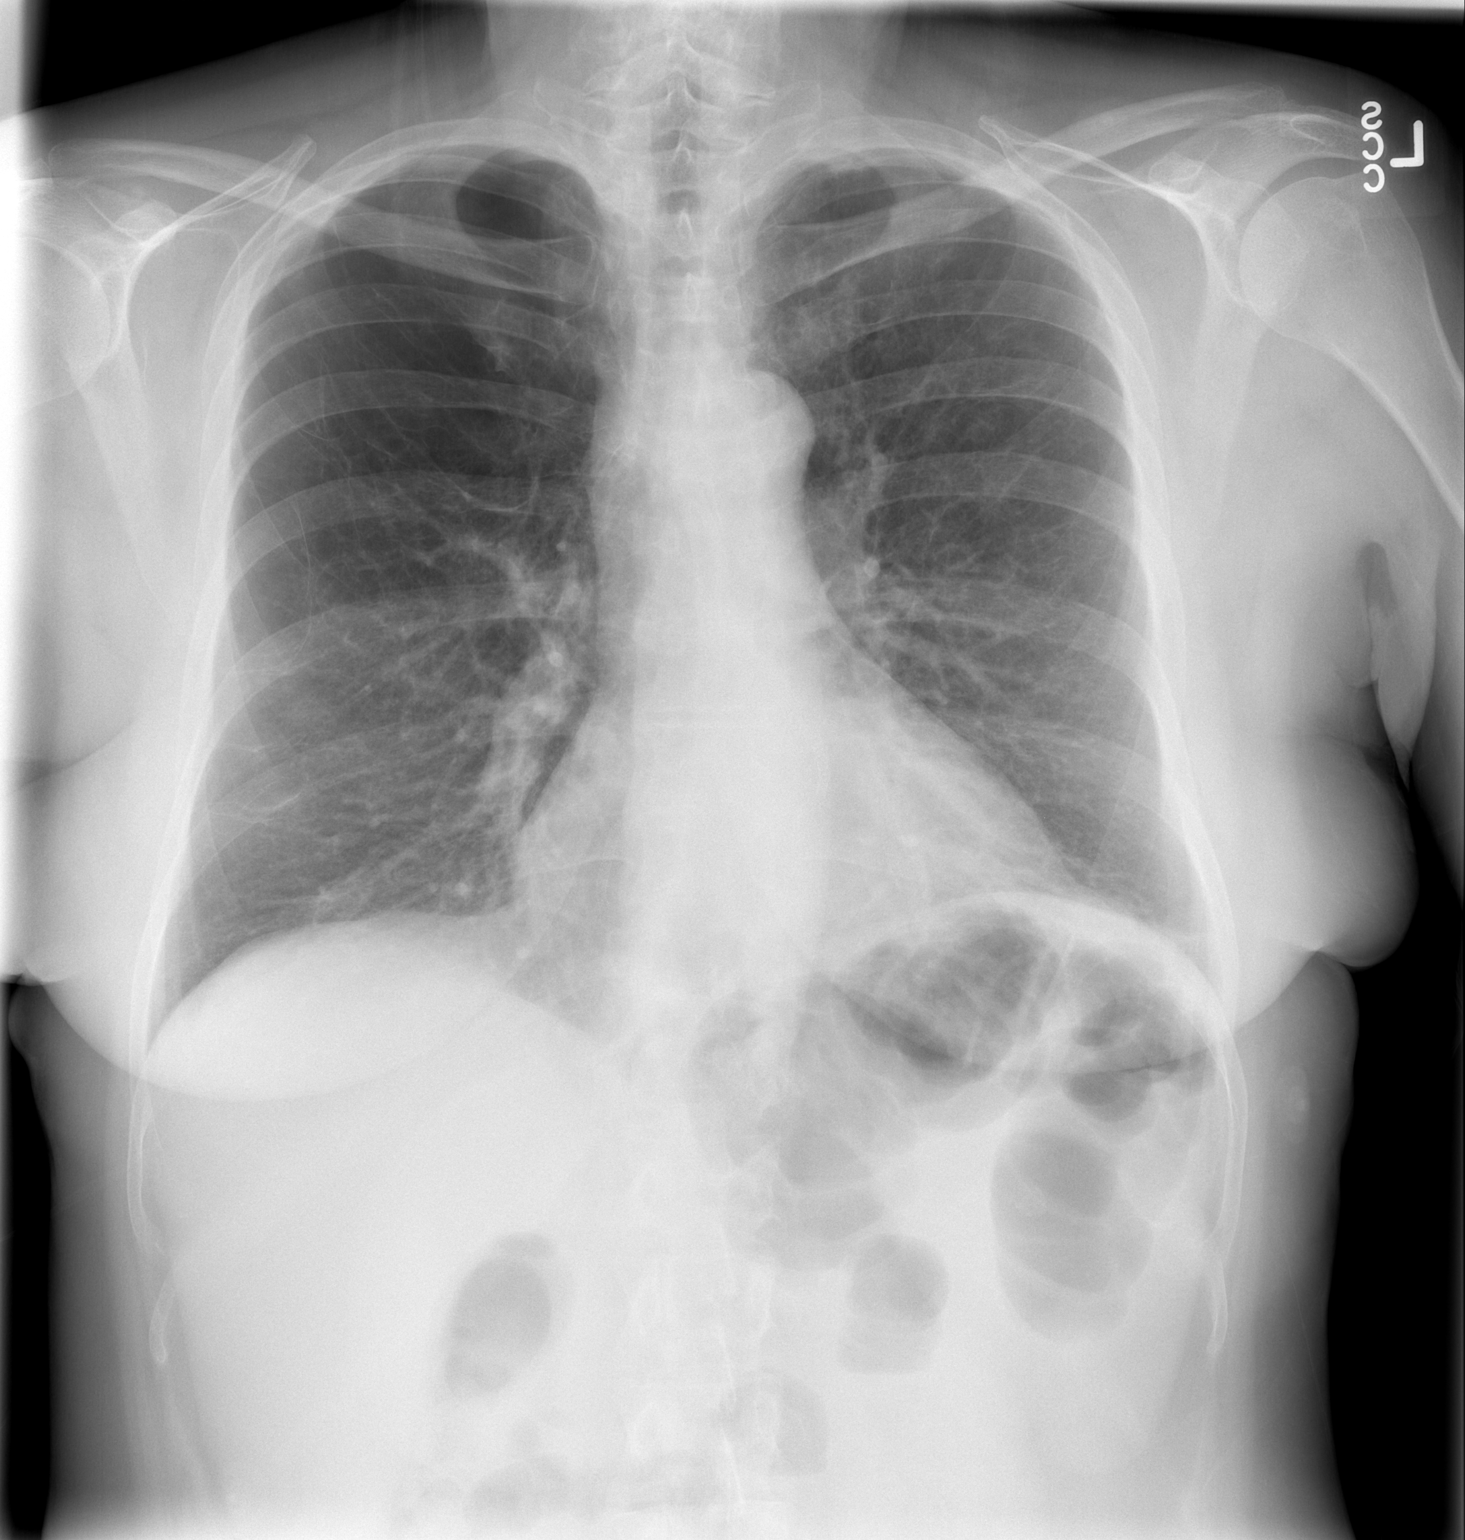

[w chest lat]
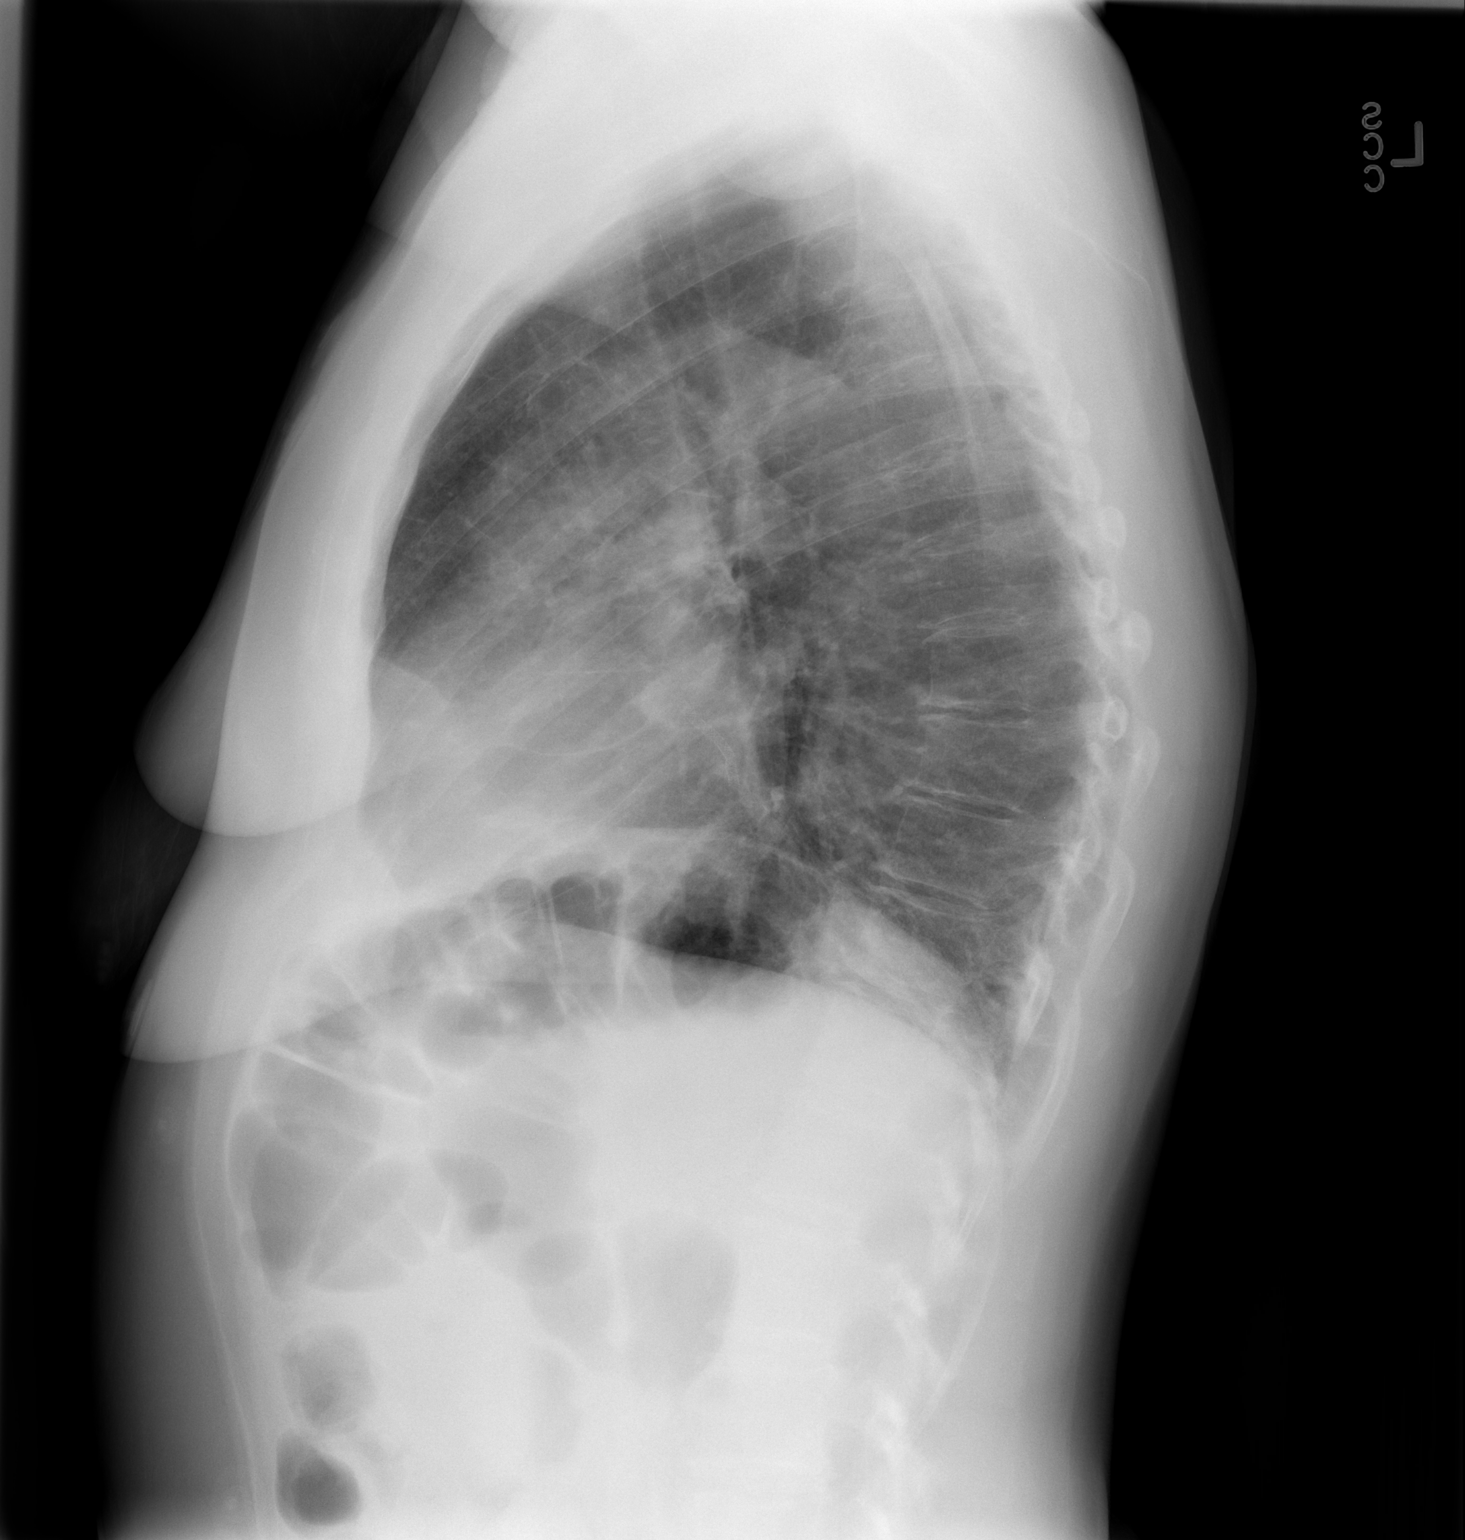

[2 of 2 positions shown; findings below may reference images not displayed]

FINDINGS: Bullous changes are noted in the right upper lobe. No acute
consolidative airspace disease. No pleural effusions. No evidence of
pulmonary edema. Heart size is normal. Mediastinal contours are
unremarkable. Atherosclerosis in the thoracic aorta. Coronary artery
stent noted, likely in the left circumflex position.
IMPRESSION: Emphysematous changes redemonstrated, without radiographic evidence
of acute cardiopulmonary disease.

## 2015-06-28 DIAGNOSIS — M25562 Pain in left knee: Secondary | ICD-10-CM | POA: Diagnosis not present

## 2015-06-28 DIAGNOSIS — M25561 Pain in right knee: Secondary | ICD-10-CM | POA: Diagnosis not present

## 2015-07-12 ENCOUNTER — Other Ambulatory Visit: Payer: Self-pay | Admitting: Internal Medicine

## 2015-07-12 ENCOUNTER — Ambulatory Visit (INDEPENDENT_AMBULATORY_CARE_PROVIDER_SITE_OTHER): Payer: Medicare Other | Admitting: Internal Medicine

## 2015-07-12 ENCOUNTER — Ambulatory Visit
Admission: RE | Admit: 2015-07-12 | Discharge: 2015-07-12 | Disposition: A | Discharge status: Home / Self Care | Payer: Medicare Other | Source: Ambulatory Visit | Attending: Internal Medicine | Admitting: Internal Medicine

## 2015-07-12 VITALS — BP 110/70 | Ht 65.5 in | Wt 143.3 lb

## 2015-07-12 DIAGNOSIS — Z9861 Coronary angioplasty status: Secondary | ICD-10-CM | POA: Diagnosis not present

## 2015-07-12 DIAGNOSIS — M25561 Pain in right knee: Secondary | ICD-10-CM | POA: Diagnosis not present

## 2015-07-12 DIAGNOSIS — J438 Other emphysema: Secondary | ICD-10-CM

## 2015-07-12 DIAGNOSIS — E785 Hyperlipidemia, unspecified: Secondary | ICD-10-CM

## 2015-07-12 DIAGNOSIS — R0789 Other chest pain: Secondary | ICD-10-CM | POA: Diagnosis not present

## 2015-07-12 DIAGNOSIS — I2510 Atherosclerotic heart disease of native coronary artery without angina pectoris: Secondary | ICD-10-CM | POA: Diagnosis not present

## 2015-07-12 DIAGNOSIS — I10 Essential (primary) hypertension: Secondary | ICD-10-CM

## 2015-07-12 DIAGNOSIS — I201 Angina pectoris with documented spasm: Secondary | ICD-10-CM

## 2015-07-12 DIAGNOSIS — R0602 Shortness of breath: Secondary | ICD-10-CM

## 2015-07-12 DIAGNOSIS — F411 Generalized anxiety disorder: Secondary | ICD-10-CM | POA: Diagnosis not present

## 2015-07-12 DIAGNOSIS — Z720 Tobacco use: Secondary | ICD-10-CM

## 2015-07-12 DIAGNOSIS — R05 Cough: Secondary | ICD-10-CM | POA: Diagnosis not present

## 2015-07-12 LAB — BASIC METABOLIC PANEL
BUN: 11 mg/dL (ref 7–25)
CO2: 27 mmol/L (ref 20–31)
Calcium: 9.8 mg/dL (ref 8.6–10.4)
Chloride: 105 mmol/L (ref 98–110)
Creat: 0.66 mg/dL (ref 0.50–0.99)
Glucose, Bld: 82 mg/dL (ref 65–99)
Potassium: 4.9 mmol/L (ref 3.5–5.3)
Sodium: 140 mmol/L (ref 135–146)

## 2015-07-12 LAB — CBC
HCT: 45.1 % (ref 36.0–46.0)
Hemoglobin: 14.7 g/dL (ref 12.0–15.0)
MCH: 29.6 pg (ref 26.0–34.0)
MCHC: 32.6 g/dL (ref 30.0–36.0)
MCV: 90.9 fL (ref 78.0–100.0)
MPV: 11.2 fL (ref 8.6–12.4)
Platelets: 212 10*3/uL (ref 150–400)
RBC: 4.96 MIL/uL (ref 3.87–5.11)
RDW: 13.6 % (ref 11.5–15.5)
WBC: 6.4 10*3/uL (ref 4.0–10.5)

## 2015-07-12 MED ORDER — CARVEDILOL 6.25 MG PO TABS: 6.2500 mg | ORAL_TABLET | Freq: Two times a day (BID) | ORAL | Status: DC

## 2015-07-12 NOTE — Patient Instructions (Addendum)
A chest x-ray takes a picture of the organs and structures inside the chest, including the heart, lungs, and blood vessels. This test can show several things, including, whether the heart is enlarges; whether fluid is building up in the lungs; and whether pacemaker / defibrillator leads are still in place.  Your physician recommends that you return for lab work TODAY.  Your physician has recommended you make the following change in your medication: INCREASE the carvedilol to 6.25 mg twice a day., ( take 2 of the 3.125 mg tablets twice a day) then start the new prescription given today.  Your physician recommends that you schedule a follow-up appointment in: 1 week with extender.
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
## 2015-07-12 NOTE — Telephone Encounter (Signed)
Rx request sent to pharmacy.  

## 2015-07-13 LAB — TROPONIN I: Troponin I: <0.01 ng/mL (ref <=0.05)

## 2015-07-17 ENCOUNTER — Telehealth: Payer: Self-pay | Admitting: *Deleted

## 2015-07-17 NOTE — Telephone Encounter (Signed)
Patient notified of CXR results. She then complains of having dizziness since the carvedilol increase. Informed patient that per her appointment with Dr Debara Pickett she is supposed to come back this week to see a extender. She will call tomorrow and get appointment.

## 2015-07-17 NOTE — Telephone Encounter (Signed)
-----   Message from Pixie Casino, MD sent at 07/12/2015  1:50 PM EST ----- CXR shows chronic bronchitis changes - no pneumonia.  Dr. Lemmie Evens

## 2015-07-20 ENCOUNTER — Encounter: Payer: Self-pay | Admitting: Physician Assistant

## 2015-07-20 ENCOUNTER — Ambulatory Visit (INDEPENDENT_AMBULATORY_CARE_PROVIDER_SITE_OTHER): Payer: Medicare Other | Admitting: Physician Assistant

## 2015-07-20 ENCOUNTER — Telehealth: Payer: Self-pay | Admitting: Physician Assistant

## 2015-07-20 ENCOUNTER — Ambulatory Visit: Payer: Medicare Other | Admitting: Physician Assistant

## 2015-07-20 VITALS — BP 108/64 | HR 68 | Ht 65.5 in | Wt 144.0 lb

## 2015-07-20 DIAGNOSIS — R002 Palpitations: Secondary | ICD-10-CM

## 2015-07-20 DIAGNOSIS — F32A Depression, unspecified: Secondary | ICD-10-CM

## 2015-07-20 DIAGNOSIS — R072 Precordial pain: Secondary | ICD-10-CM | POA: Diagnosis not present

## 2015-07-20 DIAGNOSIS — Z720 Tobacco use: Secondary | ICD-10-CM

## 2015-07-20 DIAGNOSIS — F329 Major depressive disorder, single episode, unspecified: Secondary | ICD-10-CM

## 2015-07-20 DIAGNOSIS — I201 Angina pectoris with documented spasm: Secondary | ICD-10-CM

## 2015-07-20 LAB — TSH: TSH: 1.33 mIU/L

## 2015-07-20 IMAGING — CR DG CHEST 2V
2 series · 2 of 2 positions shown · non-contrast
Comparison: Chest radiograph 02/18/2013

CLINICAL DATA: Left-sided chest pain, wheezing

EXAM:
CHEST  2 VIEW

[w chest pa]
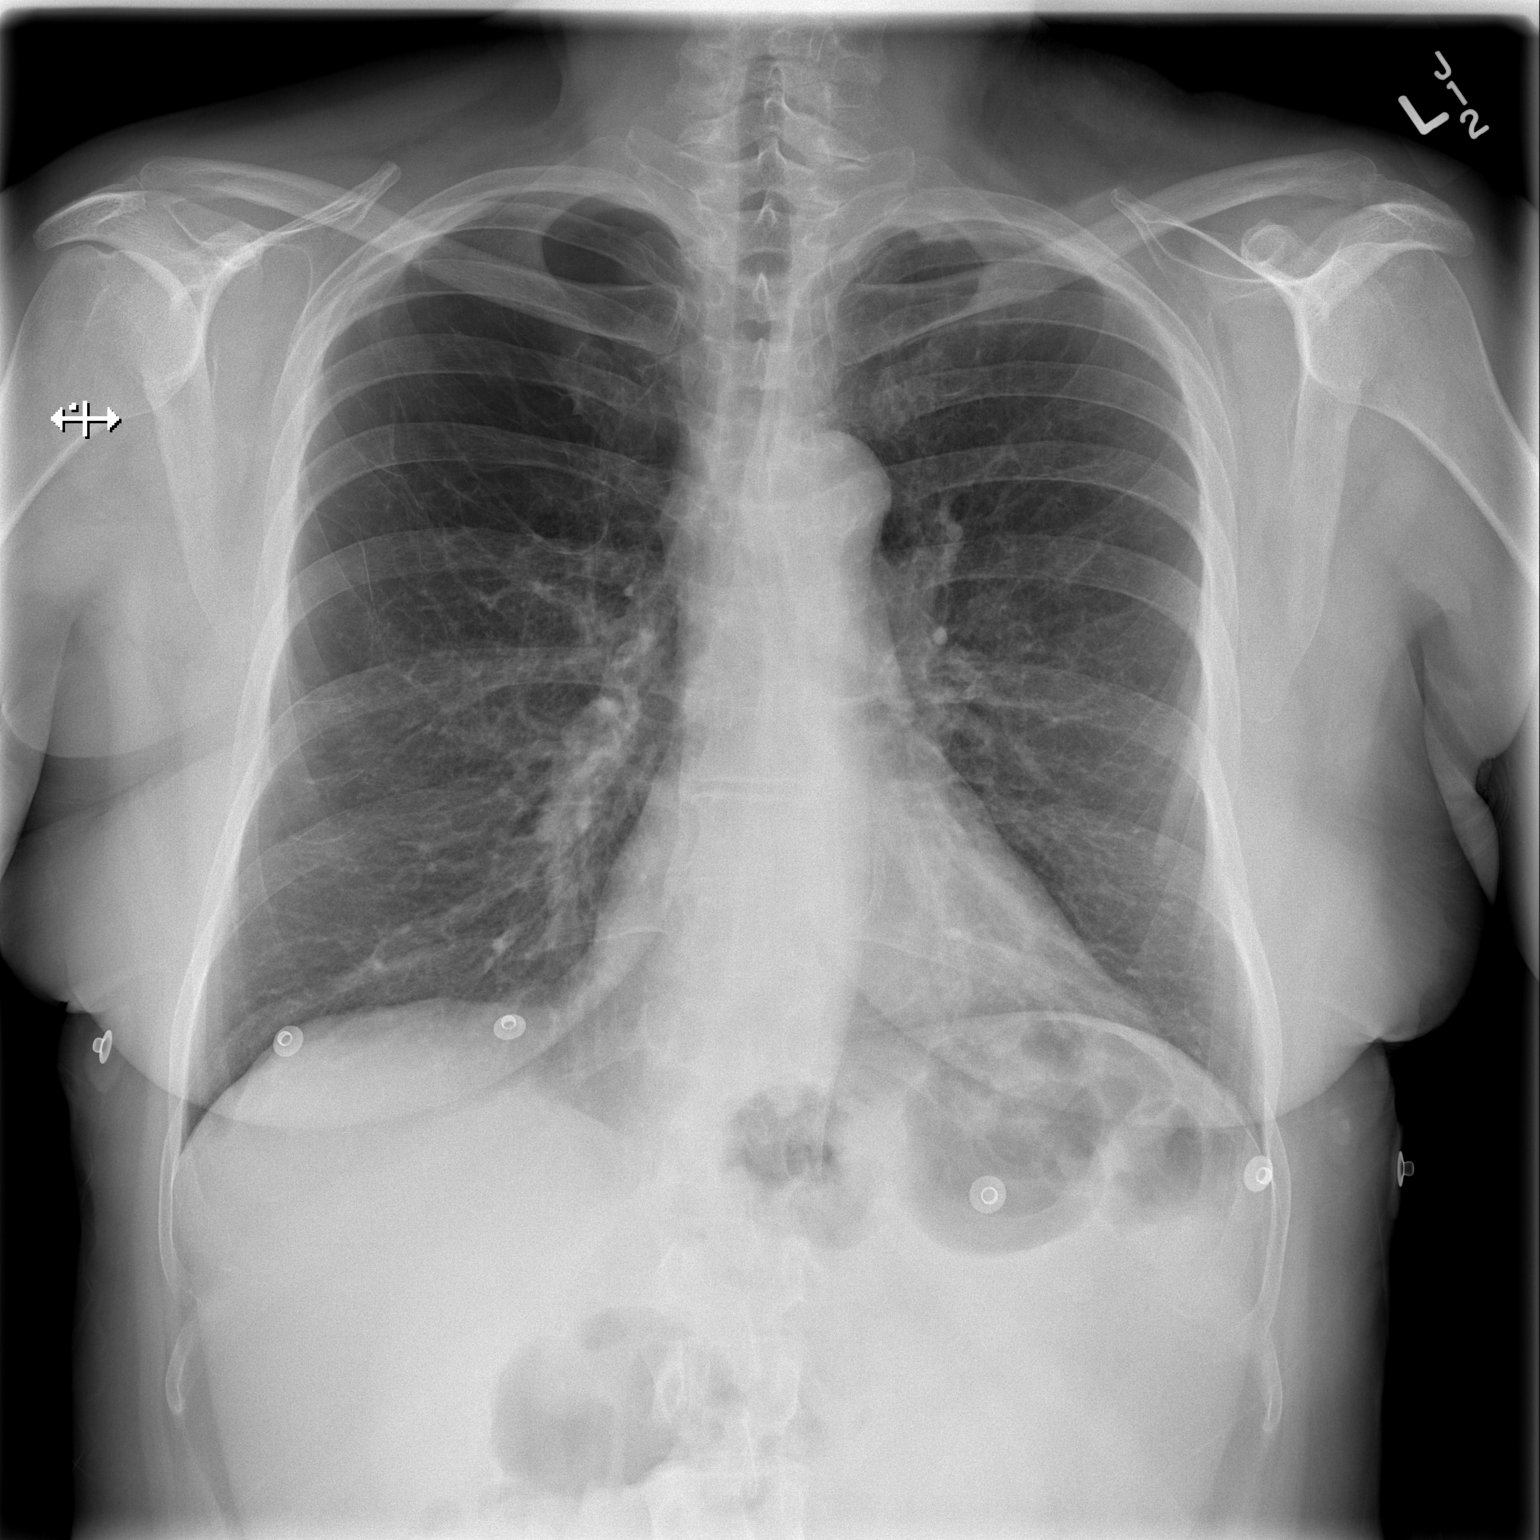

[w chest lat]
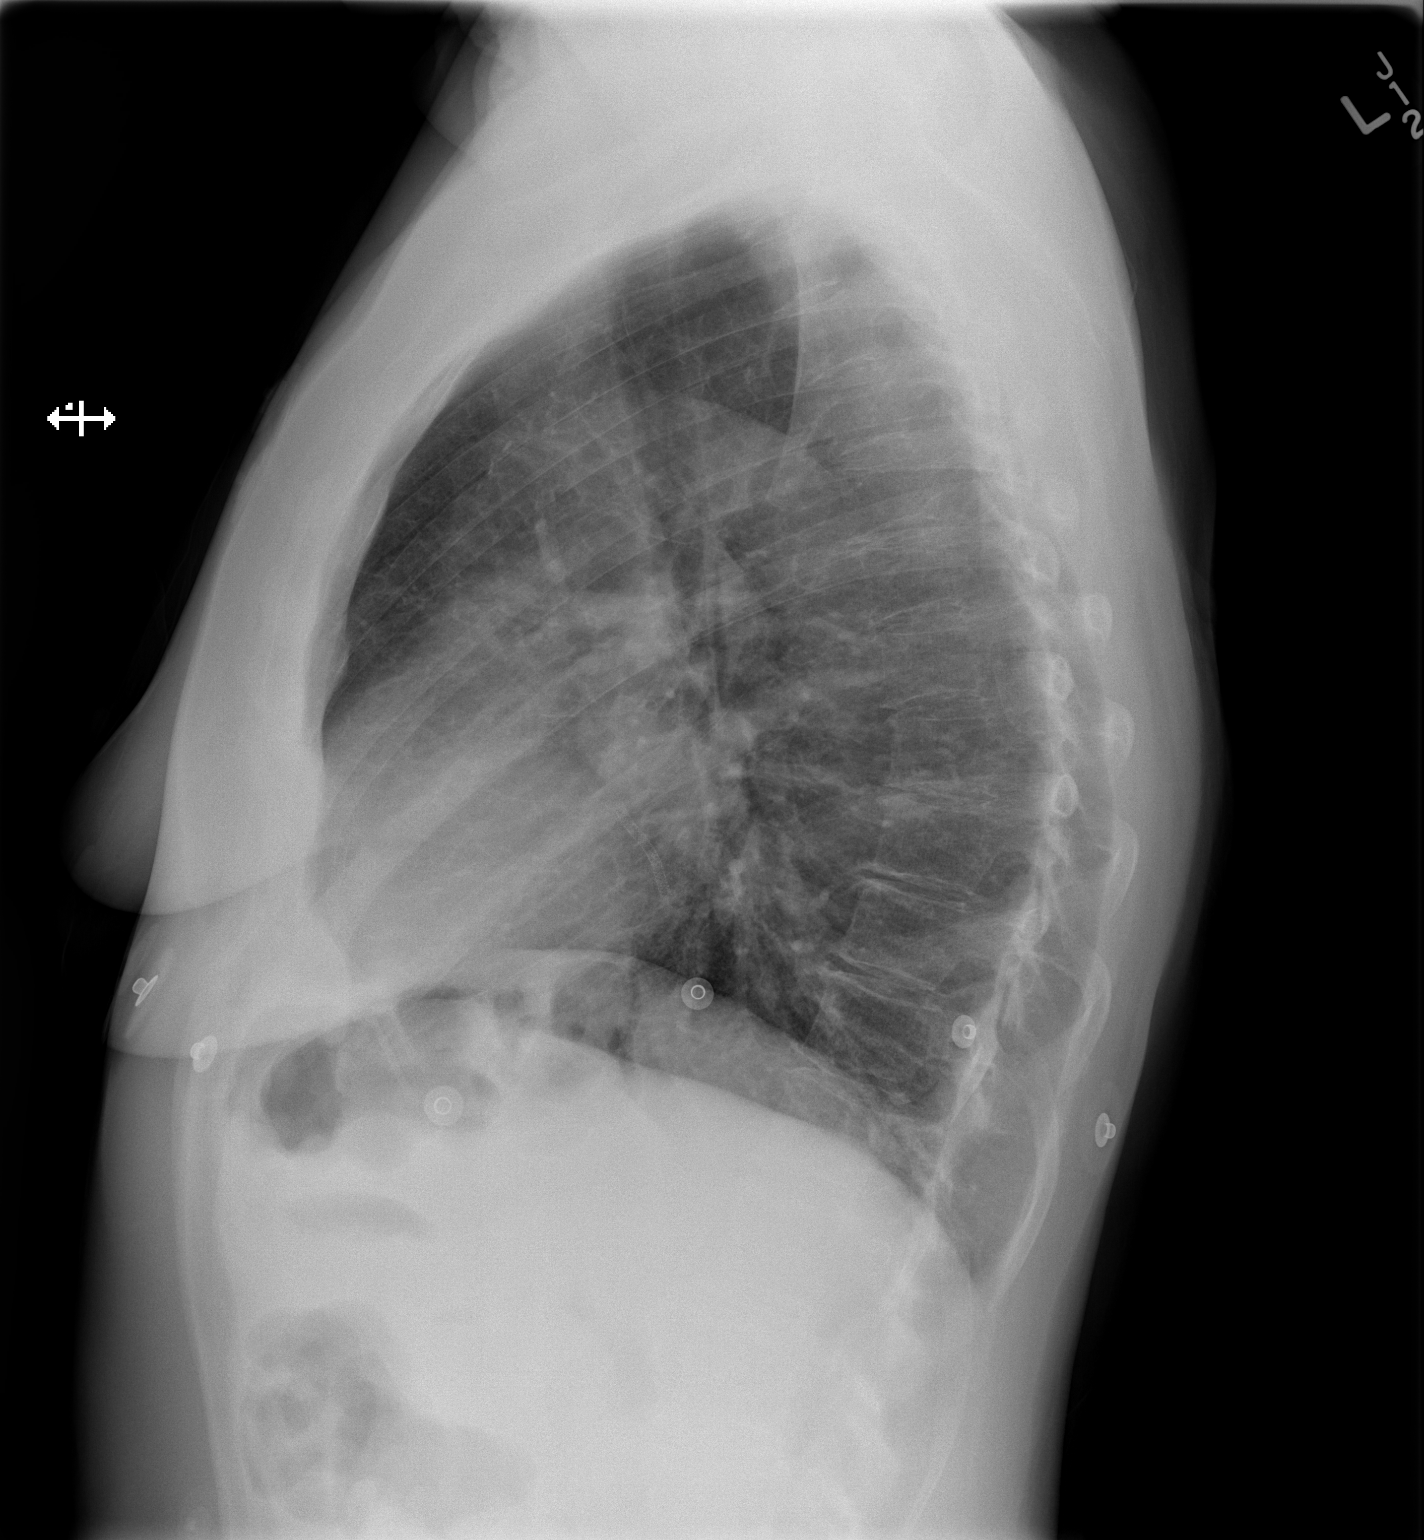

[2 of 2 positions shown; findings below may reference images not displayed]

FINDINGS: Normal cardiac silhouette. There is bullous change in the right
upper lobe which is unchanged from prior. No effusion, infiltrate,
or pneumothorax. No acute osseous abnormality.
IMPRESSION: 1. No acute cardiopulmonary findings.

2.  Right upper lobe bullous change.

## 2015-07-20 MED ORDER — NICOTINE 7 MG/24HR TD PT24: 7.0000 mg | MEDICATED_PATCH | Freq: Every day | TRANSDERMAL | Status: DC

## 2015-07-20 MED ORDER — CARVEDILOL 3.125 MG PO TABS: 3.1250 mg | ORAL_TABLET | Freq: Two times a day (BID) | ORAL | Status: DC
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
## 2015-07-20 NOTE — Patient Instructions (Addendum)
Your physician discussed the hazards of tobacco use. Tobacco use cessation is recommended and techniques and options to help you quit were discussed.  Your physician has recommended you make the following change in your medication: the carvedilol has been decreased to 3.125 mg twice a day.  A nicotine patch has been prescribed for you. DO NOT SMOKE WHILE USING THIS PATCH- CAN CAUSE CHEST PAIN!!  Your physician recommends that you return for lab work TODAY.  Suanne Marker recommends for you to seek grief counseling to help you to cope with the recent death in your life.  Your physician wants you to follow-up in: 6 MONTHS OR SOONER IF NEEDED You will receive a reminder letter in the mail two months in advance. If you don't receive a letter, please call our office to schedule the follow-up appointment.  If you need a refill on your cardiac medications before your next appointment, please call your pharmacy.

## 2015-07-20 NOTE — Telephone Encounter (Signed)
Pt called in stating that she was in to see Suanne Marker and she prescribed some Nicotine patches for her. When she took it to the pharmacy to get it filled , she was told that her insurances would not cover this nor would a prior authorization assist in financial assistance. Please f/u with her  Thanks

## 2015-07-20 NOTE — Telephone Encounter (Signed)
SPOKE TO PATIENT  PATIENT STATES MEDICAID WILL PAY FOR NICOTINE PATCH. PATIENT STATES SHE PICKED UP MEDICATIONS

## 2015-07-21 ENCOUNTER — Encounter: Payer: Self-pay | Admitting: *Deleted

## 2015-07-21 ENCOUNTER — Telehealth: Payer: Self-pay | Admitting: Physician Assistant

## 2015-07-21 ENCOUNTER — Other Ambulatory Visit: Payer: Self-pay | Admitting: Cardiology

## 2015-07-21 NOTE — Telephone Encounter (Signed)
Rx(s) sent to pharmacy electronically.  

## 2015-07-24 NOTE — Telephone Encounter (Signed)
OK to use Plavix Far enough out from stents  Decklyn Hornik, Leonie Green, MD

## 2015-07-26 ENCOUNTER — Telehealth: Payer: Self-pay | Admitting: *Deleted

## 2015-07-26 DIAGNOSIS — E785 Hyperlipidemia, unspecified: Secondary | ICD-10-CM

## 2015-07-26 DIAGNOSIS — Z79899 Other long term (current) drug therapy: Secondary | ICD-10-CM

## 2015-07-26 NOTE — Telephone Encounter (Signed)
Mail letter and labslip cmp ,lipid 

## 2015-07-26 NOTE — Telephone Encounter (Signed)
-----   Message from Raiford Simmonds, RN sent at 05/26/2015  8:44 AM EST ----- Due by 08/15/15 ,cmp ,lipid

## 2015-07-31 ENCOUNTER — Other Ambulatory Visit: Payer: Self-pay | Admitting: Family Medicine

## 2015-07-31 DIAGNOSIS — F17200 Nicotine dependence, unspecified, uncomplicated: Secondary | ICD-10-CM | POA: Diagnosis not present

## 2015-07-31 DIAGNOSIS — R109 Unspecified abdominal pain: Secondary | ICD-10-CM | POA: Diagnosis not present

## 2015-07-31 DIAGNOSIS — R7303 Prediabetes: Secondary | ICD-10-CM | POA: Diagnosis not present

## 2015-07-31 DIAGNOSIS — R1032 Left lower quadrant pain: Secondary | ICD-10-CM

## 2015-07-31 DIAGNOSIS — R739 Hyperglycemia, unspecified: Secondary | ICD-10-CM | POA: Diagnosis not present

## 2015-08-01 ENCOUNTER — Telehealth: Payer: Self-pay | Admitting: Cardiology

## 2015-08-01 NOTE — Telephone Encounter (Signed)
New Message  Pt request a call back to discuss if labs are needed. She was recently told that thyroids and cholesterol checks came back ok. However she has received a letter in the mail to come in for labs by march 14 th. Please call back to discuss.

## 2015-08-01 NOTE — Telephone Encounter (Signed)
Returned call to patient and clarified need for lipid recheck after 28-month. (last done in December). Explained lipids were not part of most recent labs checked. Pt aware & has been given instruction.

## 2015-08-02 DIAGNOSIS — Z79899 Other long term (current) drug therapy: Secondary | ICD-10-CM | POA: Diagnosis not present

## 2015-08-02 DIAGNOSIS — E785 Hyperlipidemia, unspecified: Secondary | ICD-10-CM | POA: Diagnosis not present

## 2015-08-03 ENCOUNTER — Ambulatory Visit
Admission: RE | Admit: 2015-08-03 | Discharge: 2015-08-03 | Disposition: A | Discharge status: Home / Self Care | Payer: Medicare Other | Source: Ambulatory Visit | Attending: Family Medicine | Admitting: Family Medicine

## 2015-08-03 DIAGNOSIS — R1032 Left lower quadrant pain: Secondary | ICD-10-CM

## 2015-08-03 DIAGNOSIS — R1030 Lower abdominal pain, unspecified: Secondary | ICD-10-CM | POA: Diagnosis not present

## 2015-08-03 LAB — COMPREHENSIVE METABOLIC PANEL
ALT: 9 U/L (ref 6–29)
AST: 22 U/L (ref 10–35)
Albumin: 4.1 g/dL (ref 3.6–5.1)
Alkaline Phosphatase: 54 U/L (ref 33–130)
BUN: 12 mg/dL (ref 7–25)
CO2: 28 mmol/L (ref 20–31)
Calcium: 9.6 mg/dL (ref 8.6–10.4)
Chloride: 105 mmol/L (ref 98–110)
Creat: 0.73 mg/dL (ref 0.50–0.99)
Glucose, Bld: 116 mg/dL — ABNORMAL HIGH (ref 65–99)
Potassium: 4.5 mmol/L (ref 3.5–5.3)
Sodium: 139 mmol/L (ref 135–146)
Total Bilirubin: 0.6 mg/dL (ref 0.2–1.2)
Total Protein: 6.5 g/dL (ref 6.1–8.1)

## 2015-08-03 LAB — LIPID PANEL
Cholesterol: 158 mg/dL (ref 125–200)
HDL: 37 mg/dL — ABNORMAL LOW (ref >=46)
LDL Cholesterol: 101 mg/dL (ref <130)
Total CHOL/HDL Ratio: 4.3 Ratio (ref <=5.0)
Triglycerides: 99 mg/dL (ref <150)
VLDL: 20 mg/dL (ref <30)

## 2015-08-03 MED ORDER — IOPAMIDOL (ISOVUE-300) INJECTION 61%
100.0000 mL | Freq: Once | INTRAVENOUS | Status: AC | PRN
  Administered 2015-08-03: 100 mL via INTRAVENOUS

## 2015-08-07 ENCOUNTER — Telehealth: Payer: Self-pay | Admitting: Cardiology

## 2015-08-07 NOTE — Telephone Encounter (Signed)
Noted. Have her take zetia with pravastatin for about 2 months, then repeat labs and have her see me several days later.

## 2015-08-07 NOTE — Telephone Encounter (Signed)
New message ° ° ° ° ° ° °Calling to get lab results °

## 2015-08-07 NOTE — Telephone Encounter (Signed)
Labs look stable - renal Fx & Lipids.   Lipids are still not @ goal.   Lets add Zetia 10 mg.  Make sure that she is taking Pravachol '40mg'$ .  Will then schedule to see Erasmo Downer for ? PCSK9 eval while on max dose statin &not @ goal.  Leonie Man, MD

## 2015-08-07 NOTE — Telephone Encounter (Signed)
No answer when dialed. Sent to Dr. [name] for lab results review.

## 2015-08-08 ENCOUNTER — Telehealth: Payer: Self-pay | Admitting: Cardiology

## 2015-08-08 MED ORDER — EZETIMIBE 10 MG PO TABS: 10.0000 mg | ORAL_TABLET | Freq: Every day | ORAL | Status: DC

## 2015-08-08 MED ORDER — NICOTINE 10 MG IN INHA: 1.0000 | RESPIRATORY_TRACT | Status: DC | PRN

## 2015-08-08 NOTE — Telephone Encounter (Signed)
Recommendations given, pt verbalized understanding.

## 2015-08-08 NOTE — Telephone Encounter (Signed)
Pt would like Dr [name] to send in a prescription for the Nicotrol Inhaler with 10 mg of cartridges. This is the only thing she can take to help her,the patches causes her heart to flutter so much.When she take the pills they causes her to have seizures.Please call them in to [facility]-[phone].She thinks  She will have to have a prior authorization for this.

## 2015-08-08 NOTE — Telephone Encounter (Signed)
OK to refil.  Leonie Man, MD

## 2015-08-08 NOTE — Telephone Encounter (Signed)
Rx sent to pharmacy electronically.   

## 2015-08-12 NOTE — Telephone Encounter (Signed)
Quick Note:  Cholesterol is stable, but LDL is a little higher than we want it. Let's see if we can increase her Pravachol to taking 40 mg every day. If we need to gradually increase it one day a week every month.  Continue Zetia.  Leonie Man, MD  ______

## 2015-08-14 ENCOUNTER — Telehealth: Payer: Self-pay

## 2015-08-14 NOTE — Telephone Encounter (Signed)
Prior auth for Nicotrol '10mg'$  inhaler sent to [facility].

## 2015-08-15 ENCOUNTER — Telehealth: Payer: Self-pay | Admitting: *Deleted

## 2015-08-15 ENCOUNTER — Ambulatory Visit: Payer: Medicare Other | Admitting: Cardiology

## 2015-08-15 NOTE — Telephone Encounter (Signed)
LEFT MESSAGE TO CALL BACK

## 2015-08-15 NOTE — Telephone Encounter (Signed)
I'm okay with refilling it

## 2015-08-15 NOTE — Telephone Encounter (Signed)
-----   Message from Leonie Man, MD sent at 08/12/2015  1:20 PM EST ----- Cholesterol is stable, but LDL is a little higher than we want it. Let's see if we can increase her Pravachol to taking 40 mg every day. If we need to gradually increase it one day a week every month.  Continue Zetia.  Leonie Man, MD

## 2015-08-16 NOTE — Telephone Encounter (Signed)
New message    Pt calling for rn

## 2015-08-16 NOTE — Telephone Encounter (Signed)
Spoke to patient. Result given . Verbalized understanding Patient states she is taking 40 mg pravastatin daily and zetia 10 mg She states she tried 60 mg of pravastatin - developed muscle aches. continue with current medications

## 2015-08-18 DIAGNOSIS — H2513 Age-related nuclear cataract, bilateral: Secondary | ICD-10-CM | POA: Diagnosis not present

## 2015-08-18 DIAGNOSIS — H43393 Other vitreous opacities, bilateral: Secondary | ICD-10-CM | POA: Diagnosis not present

## 2015-08-18 DIAGNOSIS — H524 Presbyopia: Secondary | ICD-10-CM | POA: Diagnosis not present

## 2015-08-18 DIAGNOSIS — H11441 Conjunctival cysts, right eye: Secondary | ICD-10-CM | POA: Diagnosis not present

## 2015-08-25 NOTE — Telephone Encounter (Signed)
error 

## 2015-08-28 IMAGING — CR DG CHEST 2V
2 series · 2 of 2 positions shown · non-contrast
Comparison: 03/22/2013

CLINICAL DATA: Chest pain

EXAM:
CHEST  2 VIEW

[w chest pa]
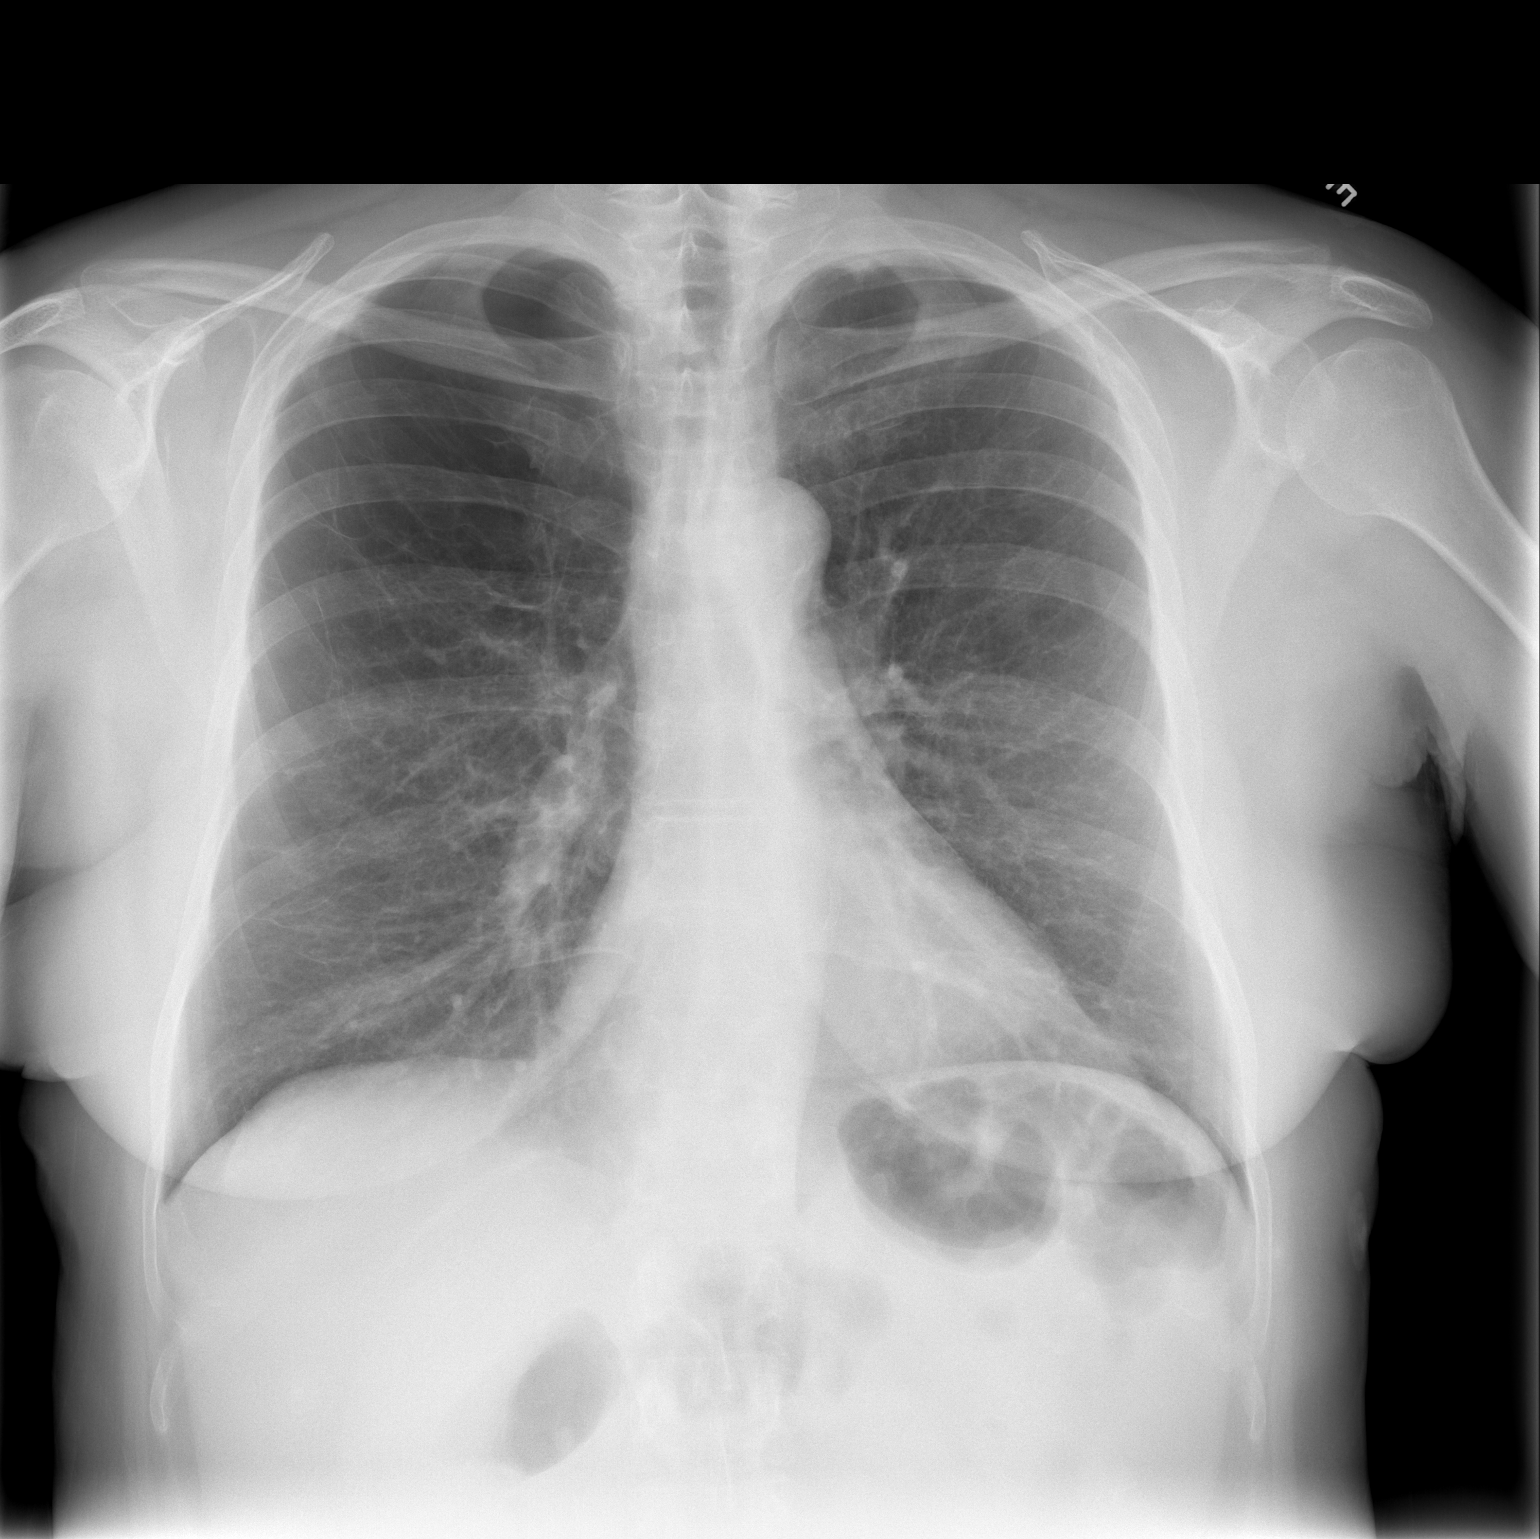

[w chest lat]
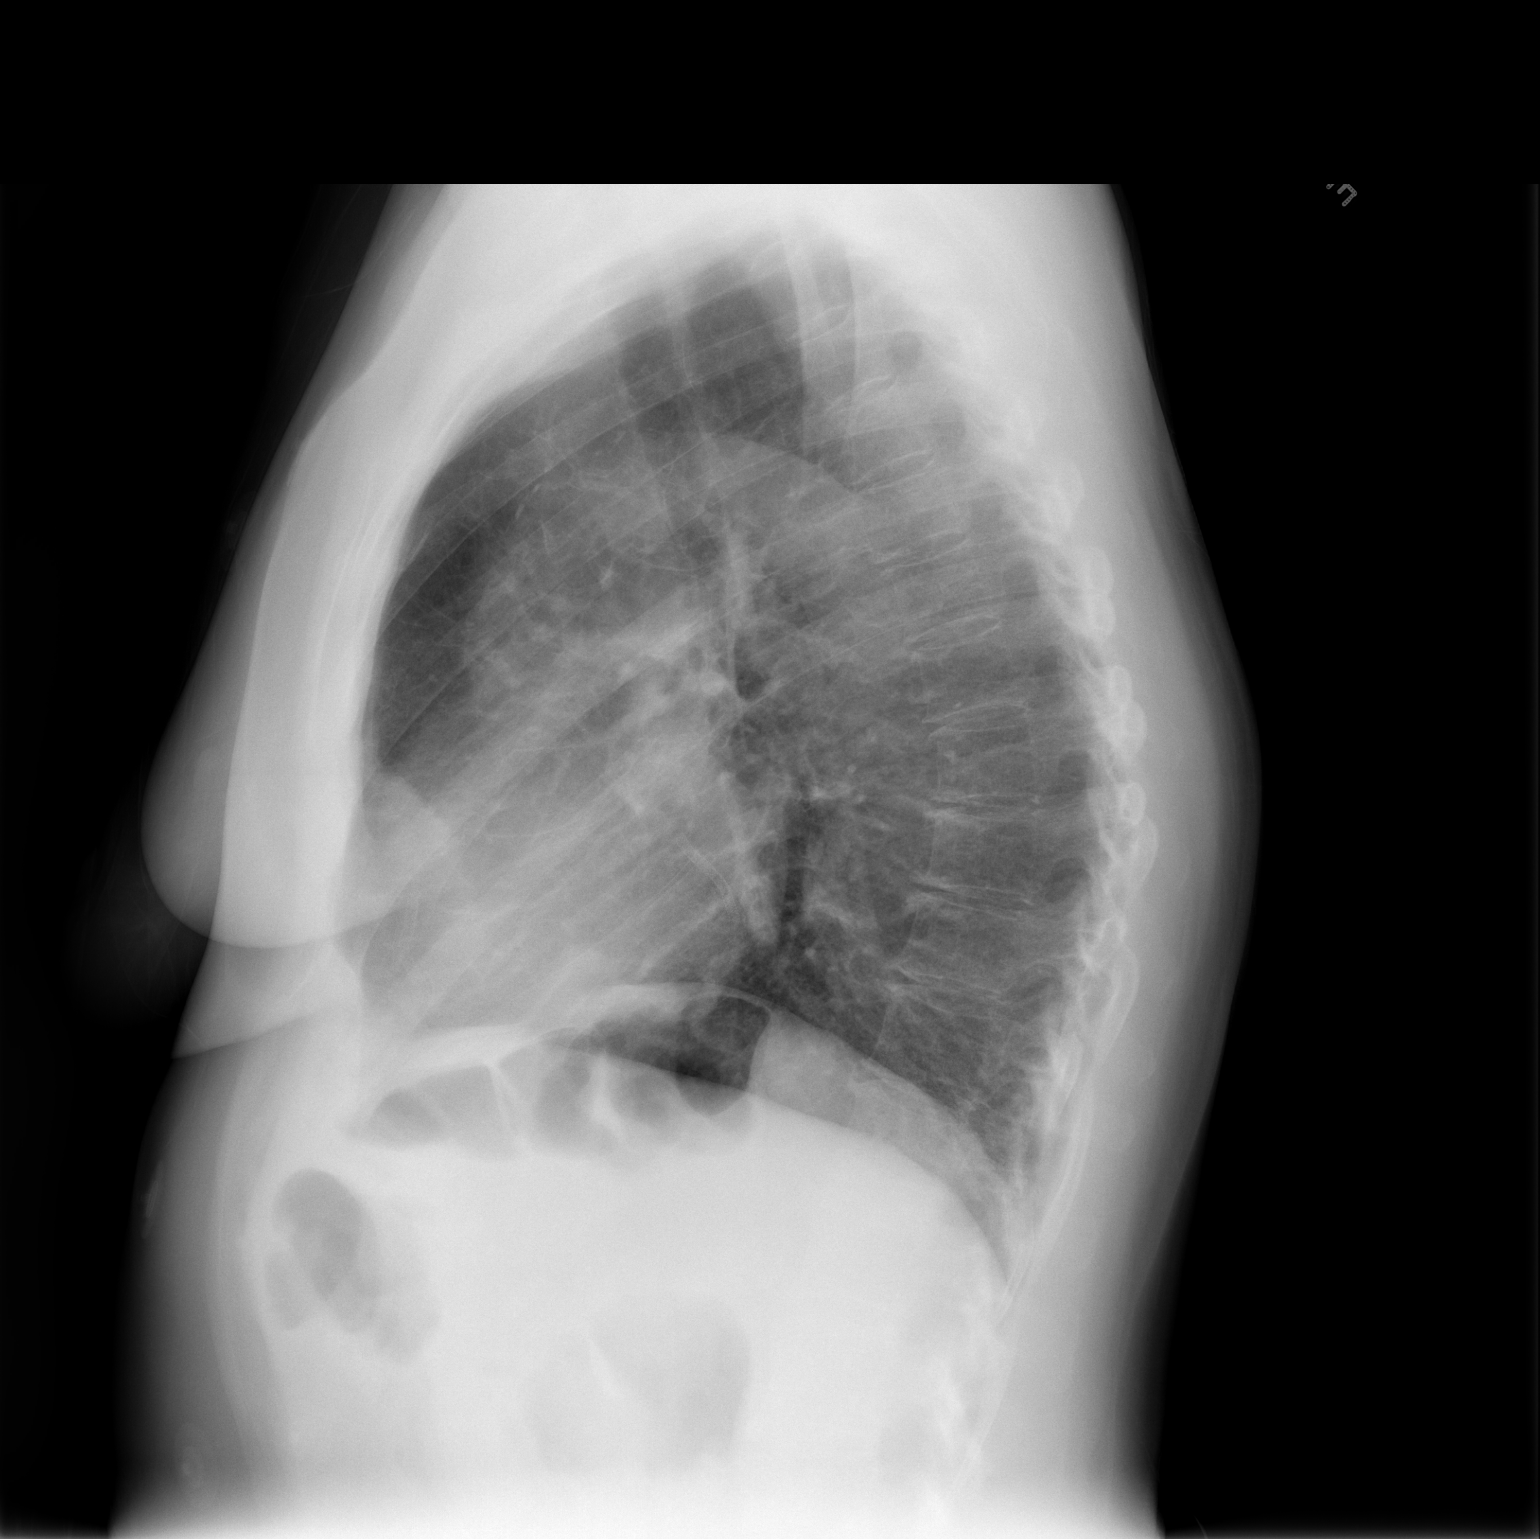

[2 of 2 positions shown; findings below may reference images not displayed]

FINDINGS: Cardiac shadow is within normal limits. Emphysematous changes are
again noted in the right lung apex. No focal infiltrate or sizable
effusion is seen. No bony abnormality is noted.
IMPRESSION: Chronic change without acute abnormality.

## 2015-08-30 ENCOUNTER — Encounter: Payer: Self-pay | Admitting: *Deleted

## 2015-09-01 ENCOUNTER — Other Ambulatory Visit: Payer: Self-pay | Admitting: Nurse Practitioner

## 2015-09-26 ENCOUNTER — Emergency Department (HOSPITAL_COMMUNITY): Payer: Medicare Other

## 2015-09-26 ENCOUNTER — Emergency Department (HOSPITAL_COMMUNITY)
Admission: EM | Admit: 2015-09-26 | Discharge: 2015-09-26 | Disposition: A | Discharge status: Home / Self Care | Payer: Medicare Other | Attending: Emergency Medicine | Admitting: Emergency Medicine

## 2015-09-26 ENCOUNTER — Encounter (HOSPITAL_COMMUNITY): Payer: Self-pay | Admitting: Emergency Medicine

## 2015-09-26 DIAGNOSIS — R0789 Other chest pain: Secondary | ICD-10-CM | POA: Insufficient documentation

## 2015-09-26 DIAGNOSIS — Z86018 Personal history of other benign neoplasm: Secondary | ICD-10-CM | POA: Diagnosis not present

## 2015-09-26 DIAGNOSIS — K219 Gastro-esophageal reflux disease without esophagitis: Secondary | ICD-10-CM | POA: Diagnosis not present

## 2015-09-26 DIAGNOSIS — E785 Hyperlipidemia, unspecified: Secondary | ICD-10-CM | POA: Insufficient documentation

## 2015-09-26 DIAGNOSIS — Z8739 Personal history of other diseases of the musculoskeletal system and connective tissue: Secondary | ICD-10-CM | POA: Diagnosis not present

## 2015-09-26 DIAGNOSIS — R05 Cough: Secondary | ICD-10-CM | POA: Diagnosis not present

## 2015-09-26 DIAGNOSIS — Z872 Personal history of diseases of the skin and subcutaneous tissue: Secondary | ICD-10-CM | POA: Diagnosis not present

## 2015-09-26 DIAGNOSIS — I129 Hypertensive chronic kidney disease with stage 1 through stage 4 chronic kidney disease, or unspecified chronic kidney disease: Secondary | ICD-10-CM | POA: Diagnosis not present

## 2015-09-26 DIAGNOSIS — F419 Anxiety disorder, unspecified: Secondary | ICD-10-CM | POA: Insufficient documentation

## 2015-09-26 DIAGNOSIS — Z9889 Other specified postprocedural states: Secondary | ICD-10-CM | POA: Insufficient documentation

## 2015-09-26 DIAGNOSIS — G8929 Other chronic pain: Secondary | ICD-10-CM | POA: Diagnosis not present

## 2015-09-26 DIAGNOSIS — J449 Chronic obstructive pulmonary disease, unspecified: Secondary | ICD-10-CM | POA: Insufficient documentation

## 2015-09-26 DIAGNOSIS — Z7902 Long term (current) use of antithrombotics/antiplatelets: Secondary | ICD-10-CM | POA: Insufficient documentation

## 2015-09-26 DIAGNOSIS — I252 Old myocardial infarction: Secondary | ICD-10-CM | POA: Insufficient documentation

## 2015-09-26 DIAGNOSIS — Z9861 Coronary angioplasty status: Secondary | ICD-10-CM | POA: Insufficient documentation

## 2015-09-26 DIAGNOSIS — I25119 Atherosclerotic heart disease of native coronary artery with unspecified angina pectoris: Secondary | ICD-10-CM | POA: Diagnosis not present

## 2015-09-26 DIAGNOSIS — F17210 Nicotine dependence, cigarettes, uncomplicated: Secondary | ICD-10-CM | POA: Diagnosis not present

## 2015-09-26 DIAGNOSIS — K5900 Constipation, unspecified: Secondary | ICD-10-CM | POA: Diagnosis not present

## 2015-09-26 DIAGNOSIS — Z79899 Other long term (current) drug therapy: Secondary | ICD-10-CM | POA: Diagnosis not present

## 2015-09-26 DIAGNOSIS — N189 Chronic kidney disease, unspecified: Secondary | ICD-10-CM | POA: Insufficient documentation

## 2015-09-26 DIAGNOSIS — E559 Vitamin D deficiency, unspecified: Secondary | ICD-10-CM | POA: Insufficient documentation

## 2015-09-26 DIAGNOSIS — G2581 Restless legs syndrome: Secondary | ICD-10-CM | POA: Diagnosis not present

## 2015-09-26 DIAGNOSIS — Z9851 Tubal ligation status: Secondary | ICD-10-CM | POA: Diagnosis not present

## 2015-09-26 DIAGNOSIS — R935 Abnormal findings on diagnostic imaging of other abdominal regions, including retroperitoneum: Secondary | ICD-10-CM | POA: Diagnosis not present

## 2015-09-26 DIAGNOSIS — R079 Chest pain, unspecified: Secondary | ICD-10-CM | POA: Diagnosis not present

## 2015-09-26 LAB — COMPREHENSIVE METABOLIC PANEL
ALT: 12 U/L — ABNORMAL LOW (ref 14–54)
AST: 27 U/L (ref 15–41)
Albumin: 3.4 g/dL — ABNORMAL LOW (ref 3.5–5.0)
Alkaline Phosphatase: 45 U/L (ref 38–126)
Anion gap: 9 (ref 5–15)
BUN: 15 mg/dL (ref 6–20)
CO2: 26 mmol/L (ref 22–32)
Calcium: 9 mg/dL (ref 8.9–10.3)
Chloride: 101 mmol/L (ref 101–111)
Creatinine, Ser: 0.76 mg/dL (ref 0.44–1.00)
GFR calc Af Amer: >60 mL/min (ref >60)
GFR calc non Af Amer: >60 mL/min (ref >60)
Glucose, Bld: 113 mg/dL — ABNORMAL HIGH (ref 65–99)
Potassium: 3.6 mmol/L (ref 3.5–5.1)
Sodium: 136 mmol/L (ref 135–145)
Total Bilirubin: 0.7 mg/dL (ref 0.3–1.2)
Total Protein: 5.8 g/dL — ABNORMAL LOW (ref 6.5–8.1)

## 2015-09-26 LAB — CBC WITH DIFFERENTIAL/PLATELET
Basophils Absolute: 0 10*3/uL (ref 0.0–0.1)
Basophils Relative: 0 %
Eosinophils Absolute: 0.1 10*3/uL (ref 0.0–0.7)
Eosinophils Relative: 1 %
HCT: 39.4 % (ref 36.0–46.0)
Hemoglobin: 13.1 g/dL (ref 12.0–15.0)
Lymphocytes Relative: 24 %
Lymphs Abs: 1.5 10*3/uL (ref 0.7–4.0)
MCH: 29.7 pg (ref 26.0–34.0)
MCHC: 33.2 g/dL (ref 30.0–36.0)
MCV: 89.3 fL (ref 78.0–100.0)
Monocytes Absolute: 0.5 10*3/uL (ref 0.1–1.0)
Monocytes Relative: 9 %
Neutro Abs: 4 10*3/uL (ref 1.7–7.7)
Neutrophils Relative %: 66 %
Platelets: 155 10*3/uL (ref 150–400)
RBC: 4.41 MIL/uL (ref 3.87–5.11)
RDW: 13.1 % (ref 11.5–15.5)
WBC: 6.1 10*3/uL (ref 4.0–10.5)

## 2015-09-26 LAB — I-STAT TROPONIN, ED: Troponin i, poc: 0 ng/mL (ref 0.00–0.08)

## 2015-09-26 MED ORDER — BISACODYL 10 MG RE SUPP
10.0000 mg | Freq: Once | RECTAL | Status: DC
  Filled 2015-09-26: qty 1

## 2015-09-26 MED ORDER — PREDNISONE 20 MG PO TABS
60.0000 mg | ORAL_TABLET | Freq: Once | ORAL | Status: DC
  Filled 2015-09-26: qty 3

## 2015-09-26 MED ORDER — ALBUTEROL SULFATE (2.5 MG/3ML) 0.083% IN NEBU
5.0000 mg | INHALATION_SOLUTION | Freq: Once | RESPIRATORY_TRACT | Status: DC
  Filled 2015-09-26: qty 6

## 2015-09-26 MED ORDER — CYCLOBENZAPRINE HCL 10 MG PO TABS
10.0000 mg | ORAL_TABLET | Freq: Once | ORAL | Status: AC
  Administered 2015-09-26: 10 mg via ORAL
  Filled 2015-09-26: qty 1

## 2015-09-26 MED ORDER — PREDNISONE 20 MG PO TABS: ORAL_TABLET | ORAL | Status: DC

## 2015-09-26 MED ORDER — CYCLOBENZAPRINE HCL 5 MG PO TABS: 5.0000 mg | ORAL_TABLET | Freq: Three times a day (TID) | ORAL | Status: DC | PRN

## 2015-09-26 MED ORDER — IPRATROPIUM BROMIDE 0.02 % IN SOLN
0.5000 mg | Freq: Once | RESPIRATORY_TRACT | Status: DC
  Filled 2015-09-26: qty 2.5

## 2015-09-26 MED ORDER — FAMOTIDINE 20 MG PO TABS
20.0000 mg | ORAL_TABLET | Freq: Once | ORAL | Status: DC
  Filled 2015-09-26: qty 1

## 2015-09-26 MED ORDER — AZITHROMYCIN 250 MG PO TABS: ORAL_TABLET | ORAL | Status: DC

## 2015-09-26 MED ORDER — FLEET ENEMA 7-19 GM/118ML RE ENEM: 1.0000 | ENEMA | Freq: Once | RECTAL | Status: DC
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
## 2015-09-26 NOTE — ED Notes (Signed)
Per [facility] CP below left breath, epigastric pain, generalized abdominal pain; no BM in  4 days. L shoulder pain, Neck, head pain. Dull CP inc with palpation 7/10 pain. ABD pain for a while CP new today.

## 2015-09-26 NOTE — Discharge Instructions (Signed)
Get miralax and put one dose or 17 g in 8 ounces of water,  take 1 dose every 30 minutes for 2-3 hours or until you  get good results and then once or twice daily to prevent constipation. You can also try dulcolax suppositories to put in your rectum to soften the hard stools. You can also give yourself a fleets enema at home.  Recheck if you get a fever, vomiting or seem worse. Take the antibiotic until gone. Take the flexeril for the soreness in your chest wall and your arms. STOP SMOKING!!!    Constipation, Adult Constipation is when a person has fewer than three bowel movements a week, has difficulty having a bowel movement, or has stools that are dry, hard, or larger than normal. As people grow older, constipation is more common. A low-fiber diet, not taking in enough fluids, and taking certain medicines may make constipation worse.  CAUSES   Certain medicines, such as antidepressants, pain medicine, iron supplements, antacids, and water pills.   Certain diseases, such as diabetes, irritable bowel syndrome (IBS), thyroid disease, or depression.   Not drinking enough water.   Not eating enough fiber-rich foods.   Stress or travel.   Lack of physical activity or exercise.   Ignoring the urge to have a bowel movement.   Using laxatives too much.  SIGNS AND SYMPTOMS   Having fewer than three bowel movements a week.   Straining to have a bowel movement.   Having stools that are hard, dry, or larger than normal.   Feeling full or bloated.   Pain in the lower abdomen.   Not feeling relief after having a bowel movement.  DIAGNOSIS  Your health care provider will take a medical history and perform a physical exam. Further testing may be done for severe constipation. Some tests may include:  A barium enema X-ray to examine your rectum, colon, and, sometimes, your small intestine.   A sigmoidoscopy to examine your lower colon.   A colonoscopy to examine your entire  colon. TREATMENT  Treatment will depend on the severity of your constipation and what is causing it. Some dietary treatments include drinking more fluids and eating more fiber-rich foods. Lifestyle treatments may include regular exercise. If these diet and lifestyle recommendations do not help, your health care provider may recommend taking over-the-counter laxative medicines to help you have bowel movements. Prescription medicines may be prescribed if over-the-counter medicines do not work.  HOME CARE INSTRUCTIONS   Eat foods that have a lot of fiber, such as fruits, vegetables, whole grains, and beans.  Limit foods high in fat and processed sugars, such as french fries, hamburgers, cookies, candies, and soda.   A fiber supplement may be added to your diet if you cannot get enough fiber from foods.   Drink enough fluids to keep your urine clear or pale yellow.   Exercise regularly or as directed by your health care provider.   Go to the restroom when you have the urge to go. Do not hold it.   Only take over-the-counter or prescription medicines as directed by your health care provider. Do not take other medicines for constipation without talking to your health care provider first.  [place] IF:   You have bright red blood in your stool.   Your constipation lasts for more than 4 days or gets worse.   You have abdominal or rectal pain.   You have thin, pencil-like stools.   You have unexplained  weight loss. MAKE SURE YOU:   Understand these instructions.  Will watch your condition.  Will get help right away if you are not doing well or get worse.   This information is not intended to replace advice given to you by your health care provider. Make sure you discuss any questions you have with your health care provider.   Document Released: 02/16/2004 Document Revised: 06/10/2014 Document Reviewed: 03/01/2013 Elsevier Interactive Patient Education [employer]

## 2015-09-27 ENCOUNTER — Other Ambulatory Visit (HOSPITAL_COMMUNITY)
Admission: RE | Admit: 2015-09-27 | Discharge: 2015-09-27 | Disposition: A | Discharge status: Home / Self Care | Payer: Medicare Other | Source: Ambulatory Visit | Attending: Obstetrics & Gynecology | Admitting: Obstetrics & Gynecology

## 2015-09-27 ENCOUNTER — Ambulatory Visit (INDEPENDENT_AMBULATORY_CARE_PROVIDER_SITE_OTHER): Payer: Medicare Other | Admitting: Obstetrics & Gynecology

## 2015-09-27 ENCOUNTER — Encounter: Payer: Self-pay | Admitting: Obstetrics & Gynecology

## 2015-09-27 VITALS — BP 125/65 | HR 89 | Wt 140.4 lb

## 2015-09-27 DIAGNOSIS — Z01419 Encounter for gynecological examination (general) (routine) without abnormal findings: Secondary | ICD-10-CM | POA: Diagnosis not present

## 2015-09-27 DIAGNOSIS — Z124 Encounter for screening for malignant neoplasm of cervix: Secondary | ICD-10-CM

## 2015-09-27 DIAGNOSIS — R102 Pelvic and perineal pain: Secondary | ICD-10-CM

## 2015-09-27 DIAGNOSIS — Z1151 Encounter for screening for human papillomavirus (HPV): Secondary | ICD-10-CM | POA: Insufficient documentation

## 2015-09-27 NOTE — Progress Notes (Signed)
   Subjective:    Patient ID: Michele [name], female    DOB: 07-Jun-1955, 60 y.o.   MRN: [id number]  HPI  60 yo lady here with a 1 + year h/o right lower quadrant pain. She has chronic constipation.    Review of Systems She had a pap smear several years ago. She is occasionally sexually active.    Objective:   Physical Exam Thin WFNAD Breathing, conversing, and ambulating normally Abd- benign Pap smear obtained, cervix appears normal Bimanual exam reveals normal, small uterus, non enlarged adnexal       Assessment & Plan:  Chronic right lower quadrant pain- I suspect that this is due to her chronic constipation and not a gyn etiology.  I will however, order a gyn u/s to reassure the patient

## 2015-09-27 NOTE — Progress Notes (Signed)
[place] scheduled for May 4th @ 1300.  Pt notified.

## 2015-09-28 LAB — CYTOLOGY - PAP

## 2015-09-29 ENCOUNTER — Telehealth: Payer: Self-pay | Admitting: Gastroenterology

## 2015-09-29 DIAGNOSIS — R070 Pain in throat: Secondary | ICD-10-CM | POA: Diagnosis not present

## 2015-09-29 DIAGNOSIS — J449 Chronic obstructive pulmonary disease, unspecified: Secondary | ICD-10-CM | POA: Diagnosis not present

## 2015-09-29 DIAGNOSIS — R1031 Right lower quadrant pain: Secondary | ICD-10-CM | POA: Diagnosis not present

## 2015-09-29 DIAGNOSIS — K5900 Constipation, unspecified: Secondary | ICD-10-CM | POA: Diagnosis not present

## 2015-09-29 DIAGNOSIS — R079 Chest pain, unspecified: Secondary | ICD-10-CM | POA: Diagnosis not present

## 2015-09-29 NOTE — Telephone Encounter (Signed)
Appointment made an patient notified.

## 2015-09-29 NOTE — Telephone Encounter (Signed)
Yes, if there is availability.  Thanks

## 2015-09-29 NOTE — Telephone Encounter (Signed)
Spoke with the patient. She is not in pain right now. She says the pain comes mostly at night. It is a pulling pain that doubles her over. Hurts into the rectum. When she swallows she has the sensation of something sticking in her throat. She has regurgitated parts of a sandwich. She does not choke or cough. She is able to take in liquids, but still has the sticking sensation. Should I put her in to see an APP next week?

## 2015-10-02 ENCOUNTER — Telehealth: Payer: Self-pay | Admitting: Physician Assistant

## 2015-10-02 ENCOUNTER — Telehealth: Payer: Self-pay | Admitting: Cardiology

## 2015-10-02 NOTE — Telephone Encounter (Signed)
Patient was taking IBgard. She has stopped it, but wonders if it caused her symptoms of swallowing. There has not been in a change in her symptoms. She is a little lightheaded when she stands up. She is concerned she has had green bowel movements that were loose. Afebrile. No vomiting or choking. Declines urgent care or ER for evaluation. Keep appointment as scheduled unless she acutely worsens.

## 2015-10-02 NOTE — Telephone Encounter (Signed)
Pt calling to talk about her abdominal pain she has been experiencing for about 1 year.  She recently went to the ER for the pain. She was sent home to f/u with Dr. Ardis Hughs at GI office. She is concerned that it is hindering the absorption of her heart medications, particularly her plavix. She is still having regular bowel movements and is voiding adequately.  She says at times it is hard for her to swallow. GI encouraged her to eat liquids and soft foods at this time. She has an upcoming appt with GI this Thursday. Advised pt to ask his opinion at that appt. Advised pt to continue taking her medications as ordered. She verbalized understanding. She said she will call if she has any other questions and go to the ER in case of an emergency.

## 2015-10-02 NOTE — Telephone Encounter (Signed)
Michele [name] is calling because she is wanting to know could her heart medications cause her stents not to work. She states she is getting weaker and she is not able to swallow medication correctly . Please call   Thanks

## 2015-10-04 IMAGING — CR DG CHEST 2V
2 series · 2 of 2 positions shown · non-contrast
Comparison: DG CHEST 2 VIEW dated 04/30/2013;

CLINICAL DATA: ABDOMINAL PAIN COUGH CHEST PAIN

EXAM:
CHEST  2 VIEW

[w chest pa]
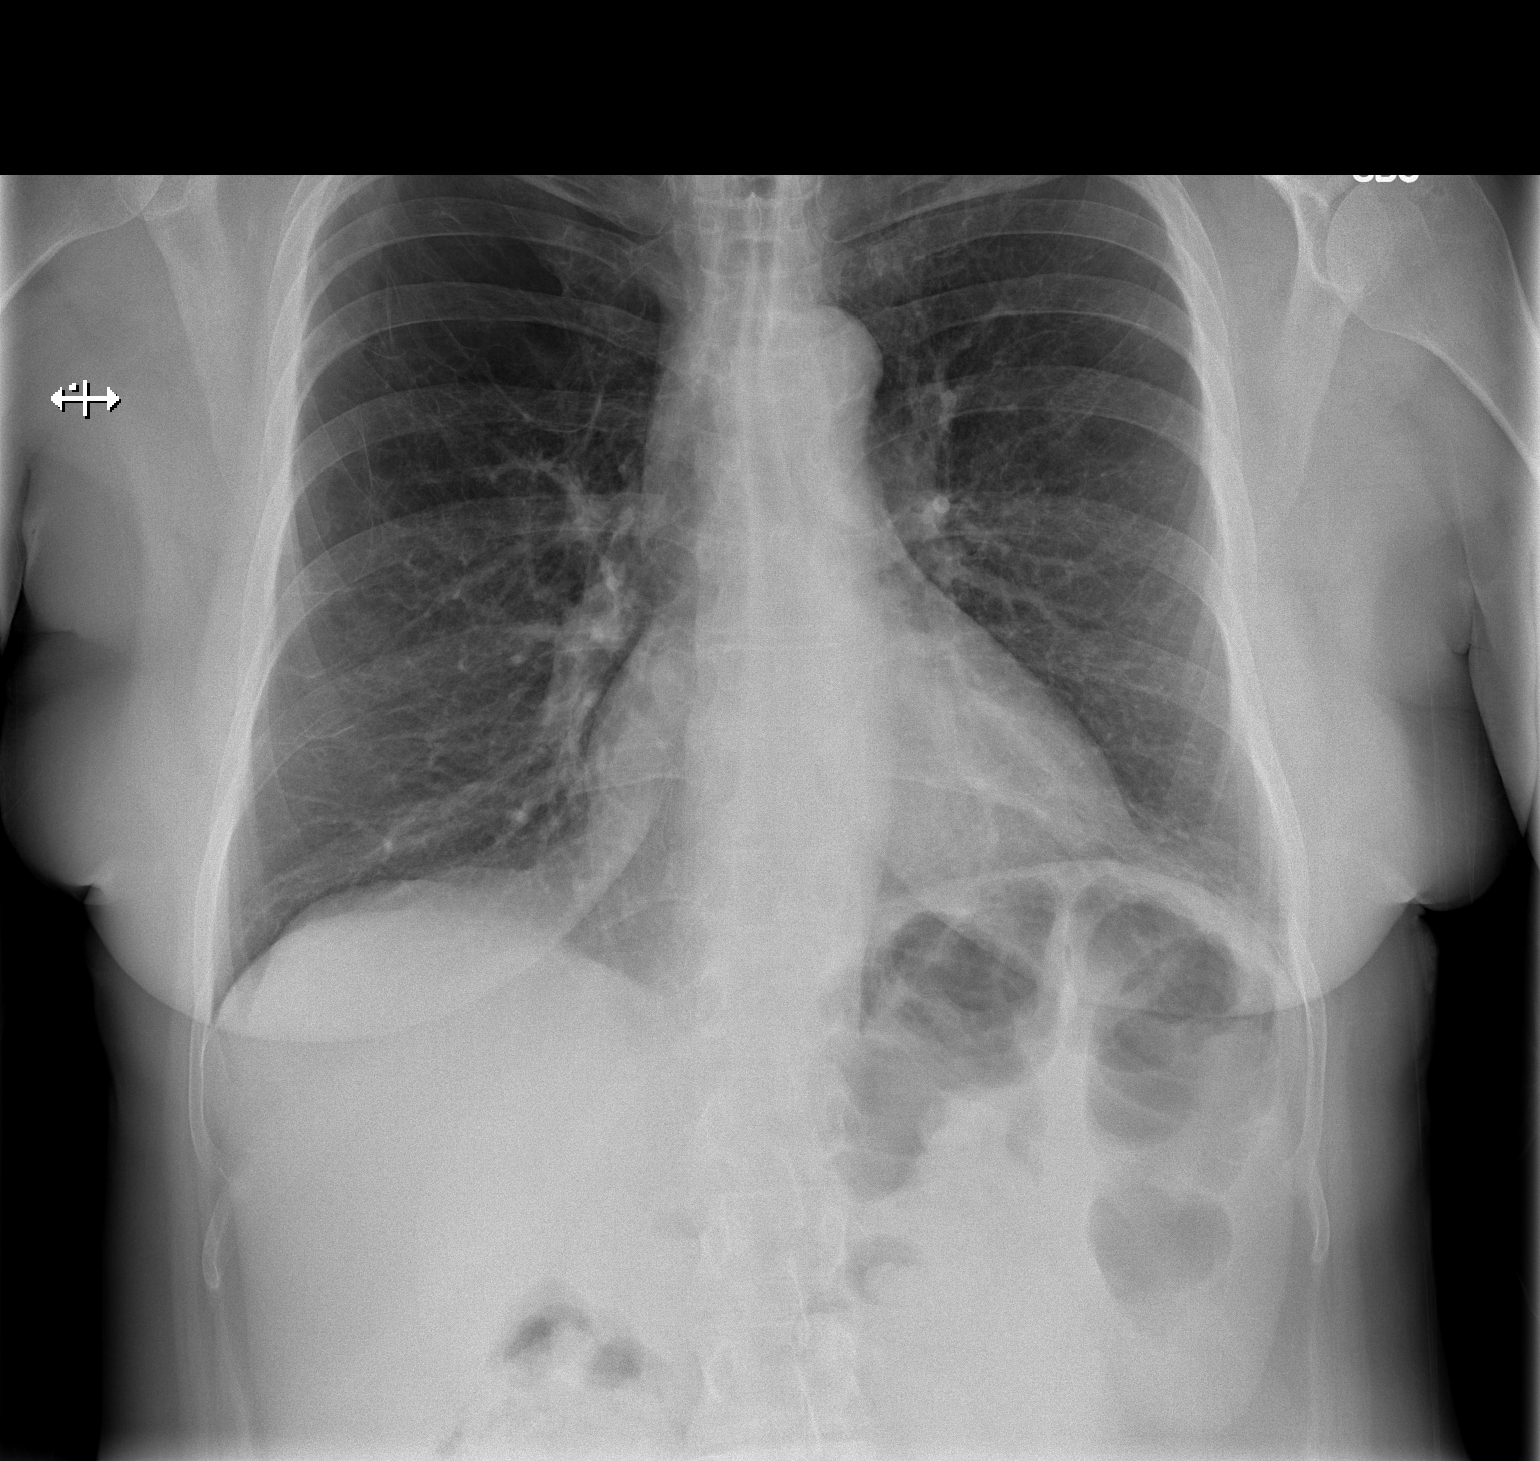

[w chest lat]
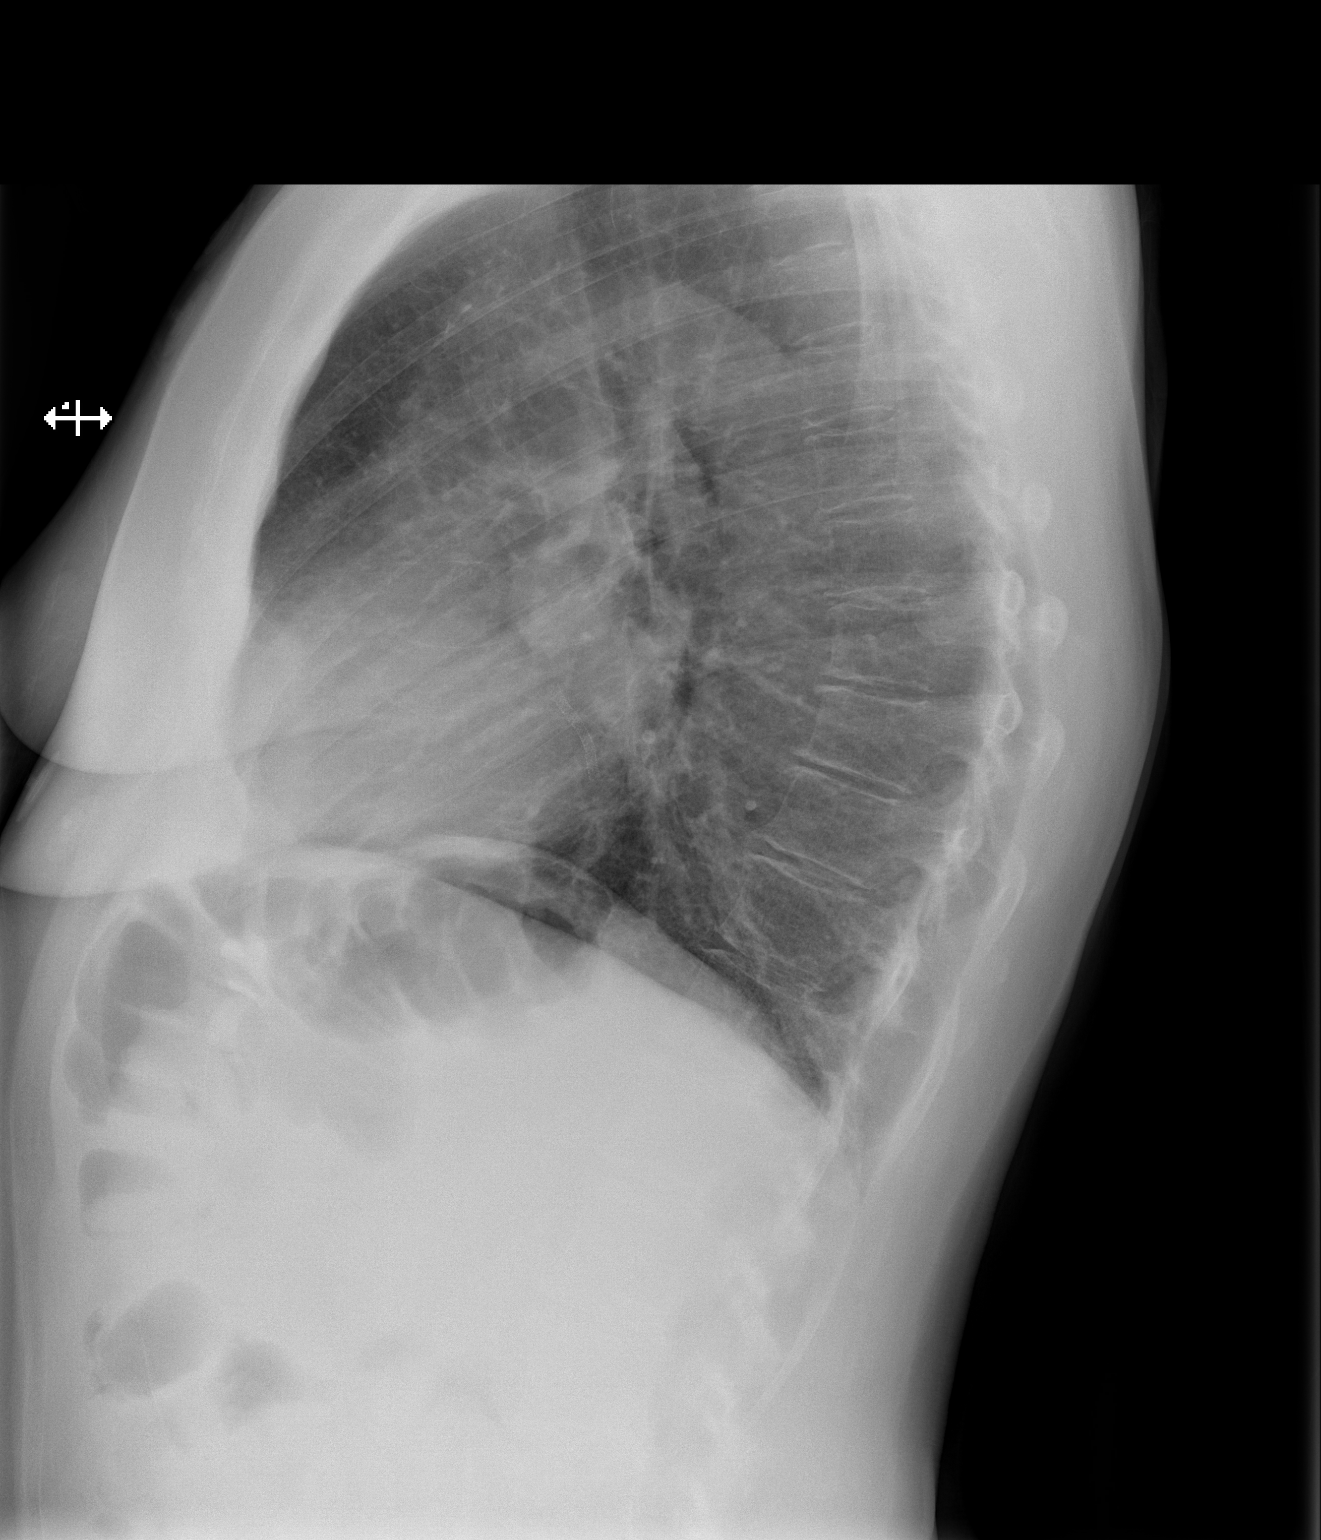

[2 of 2 positions shown; findings below may reference images not displayed]

DG CHEST 2 VIEW dated
03/22/2013; DG CHEST 2 VIEW dated 02/18/2013; DG CHEST 2 VIEW dated
11/18/2012
FINDINGS: There is bilateral mild interstitial thickening, likely chronic.
There is no focal parenchymal opacity, pleural effusion, or
pneumothorax. The heart and mediastinal contours are unremarkable.

Mild thoracic spine degenerative disc disease. .
IMPRESSION: No active cardiopulmonary disease.

## 2015-10-05 ENCOUNTER — Ambulatory Visit (INDEPENDENT_AMBULATORY_CARE_PROVIDER_SITE_OTHER): Payer: Medicare Other | Admitting: Physician Assistant

## 2015-10-05 ENCOUNTER — Ambulatory Visit (HOSPITAL_COMMUNITY): Payer: Medicare Other

## 2015-10-05 ENCOUNTER — Telehealth: Payer: Self-pay | Admitting: *Deleted

## 2015-10-05 ENCOUNTER — Encounter: Payer: Self-pay | Admitting: Physician Assistant

## 2015-10-05 ENCOUNTER — Telehealth: Payer: Self-pay | Admitting: Cardiology

## 2015-10-05 VITALS — BP 130/80 | HR 86 | Ht 65.0 in | Wt 138.0 lb

## 2015-10-05 DIAGNOSIS — I201 Angina pectoris with documented spasm: Secondary | ICD-10-CM | POA: Diagnosis not present

## 2015-10-05 DIAGNOSIS — Z86010 Personal history of colonic polyps: Secondary | ICD-10-CM

## 2015-10-05 DIAGNOSIS — R634 Abnormal weight loss: Secondary | ICD-10-CM

## 2015-10-05 DIAGNOSIS — R1031 Right lower quadrant pain: Secondary | ICD-10-CM

## 2015-10-05 DIAGNOSIS — R1310 Dysphagia, unspecified: Secondary | ICD-10-CM

## 2015-10-05 DIAGNOSIS — K5909 Other constipation: Secondary | ICD-10-CM | POA: Diagnosis not present

## 2015-10-05 DIAGNOSIS — Z1211 Encounter for screening for malignant neoplasm of colon: Secondary | ICD-10-CM

## 2015-10-05 IMAGING — CT CT ABD-PELV W/ CM
2 of 5 series · 14 of 46 positions shown, 16 images · IV contrast (APPLIED)
Comparison: US ABDOMEN COMPLETE dated 01/30/2012; CT ABD/PELVIS W CM
dated 09/28/2010; DG CHEST 2 VIEW dated 06/06/2013

CLINICAL DATA: Abdominal pain.  Evaluate for appendicitis.

EXAM:
CT ABDOMEN AND PELVIS WITH CONTRAST
TECHNIQUE: Multidetector CT imaging of the abdomen and pelvis was performed
using the standard protocol following bolus administration of
intravenous contrast.
CONTRAST:  100mL OMNIPAQUE IOHEXOL 300 MG/ML SOLN, 25mL OMNIPAQUE
IOHEXOL 300 MG/ML SOLN

[Series 2: abd/ pelvis 5.0 i30f 1 · axial · 0.76mm/px · z∈[+901,+1291]mm · 11 of 88 slices shown, 13 images]
[im 5/88  soft-tissue]
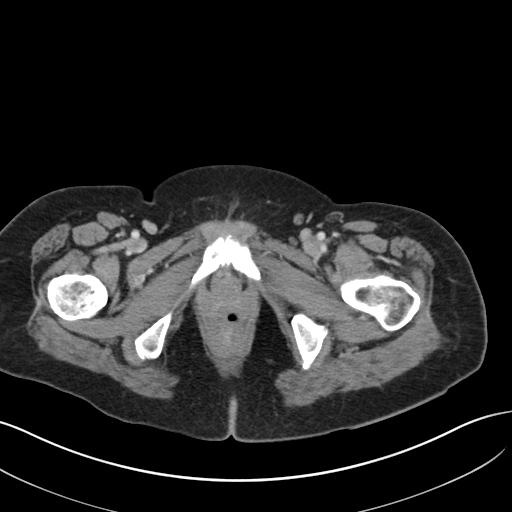
[im 5/88  bone]
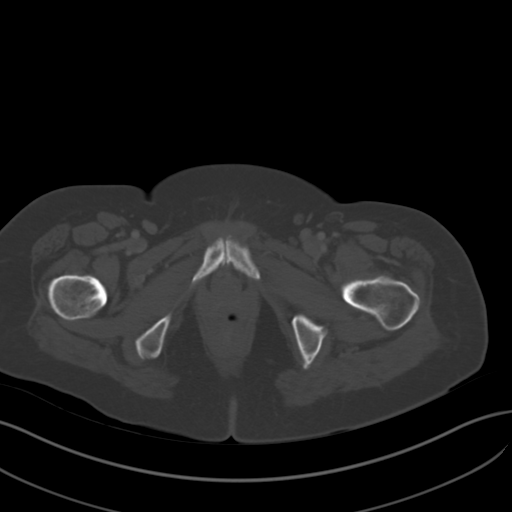
[im 14/88  soft-tissue]
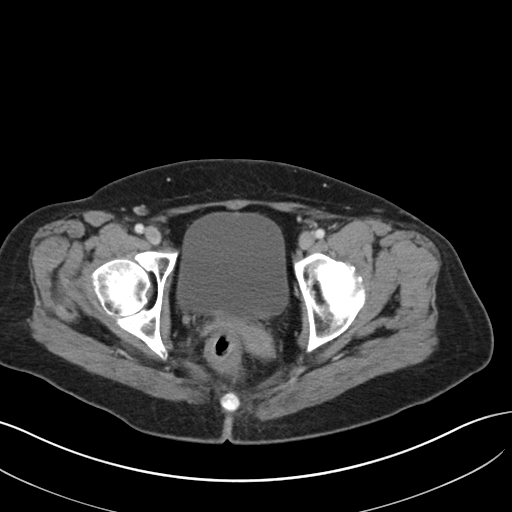
[im 23/88  soft-tissue]
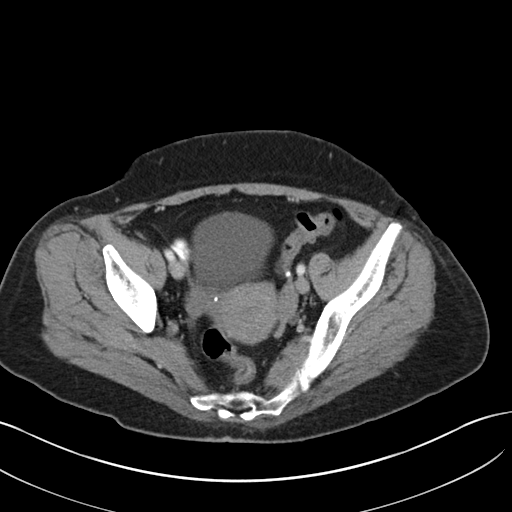
[im 28/88  soft-tissue]
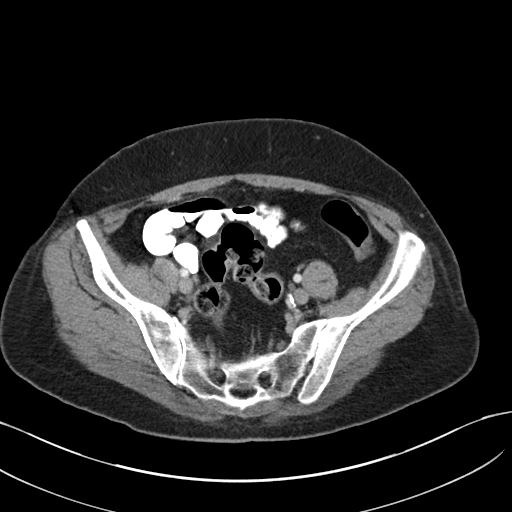
[im 37/88  soft-tissue]
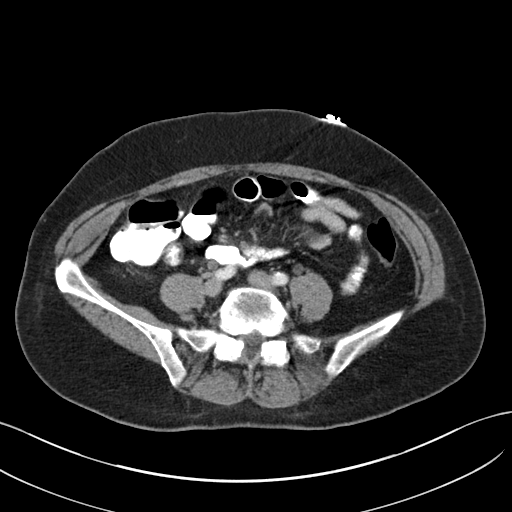
[im 46/88  soft-tissue]
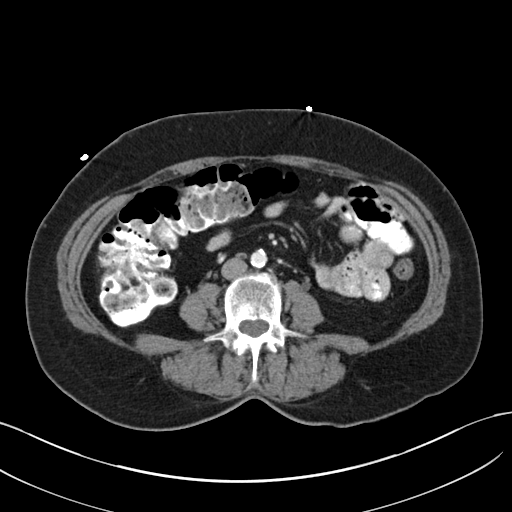
[im 51/88  soft-tissue]
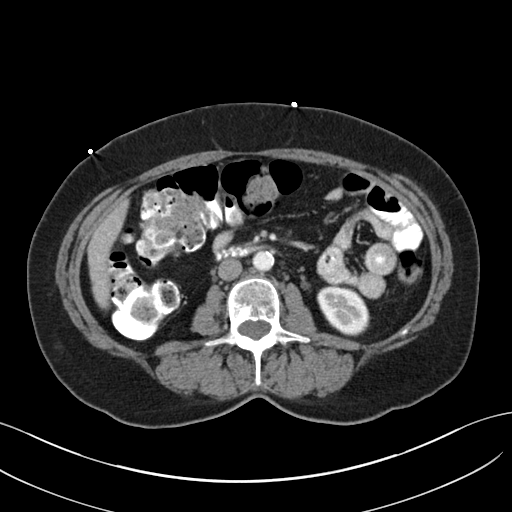
[im 60/88  soft-tissue]
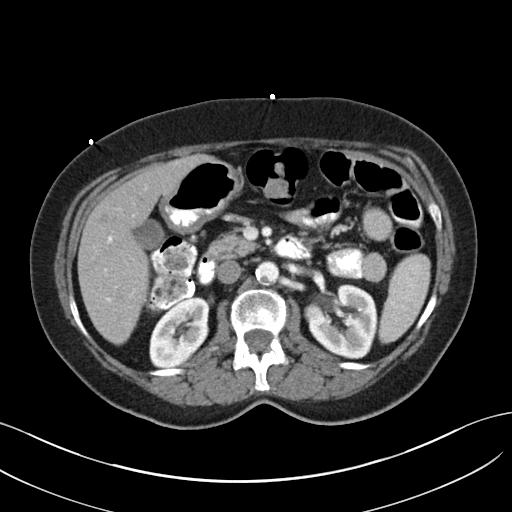
[im 65/88  soft-tissue]
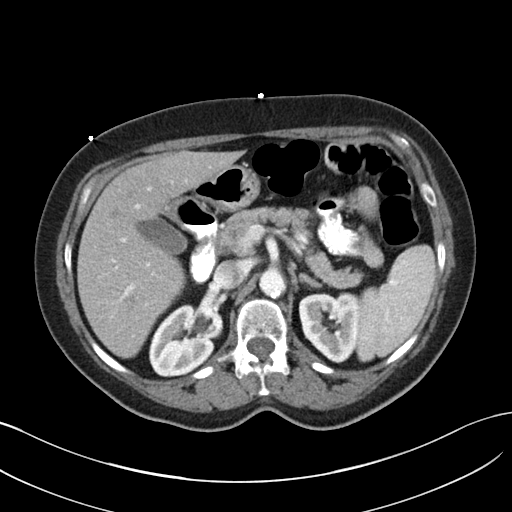
[im 65/88  bone]
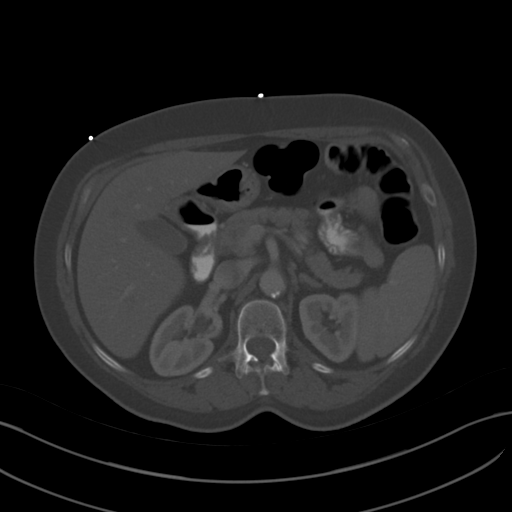
[im 74/88  soft-tissue]
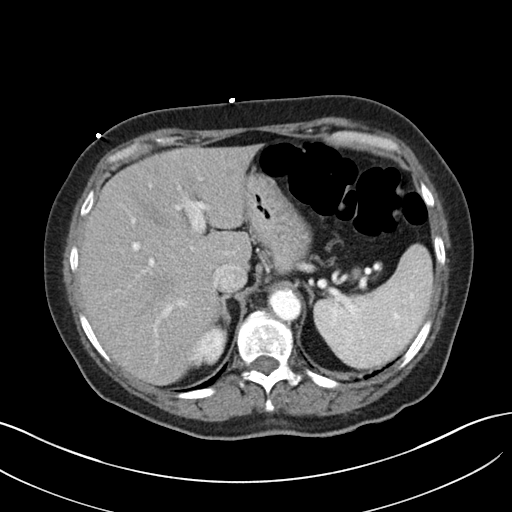
[im 83/88  soft-tissue]
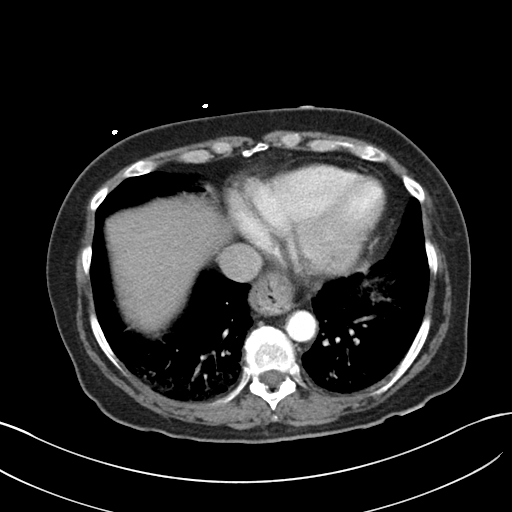

[Series 5: cor · coronal · 0.67mm/px · 3 of 115 slices shown]
[im 39/115  soft-tissue]
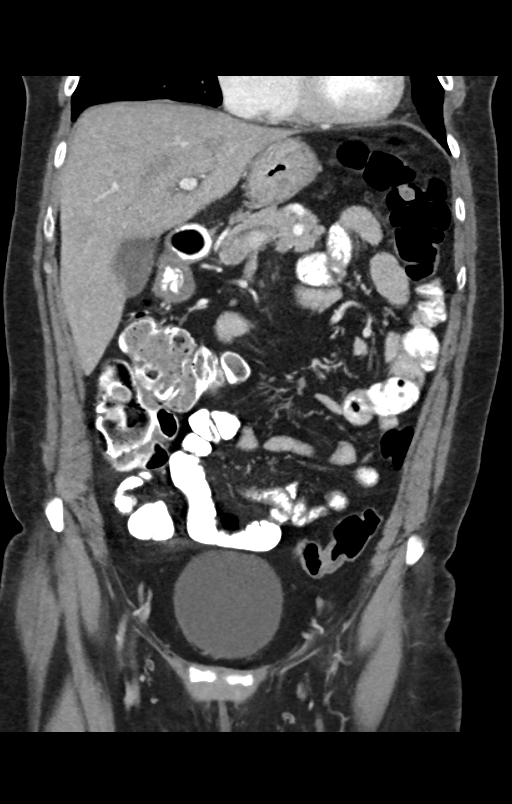
[im 51/115  soft-tissue]
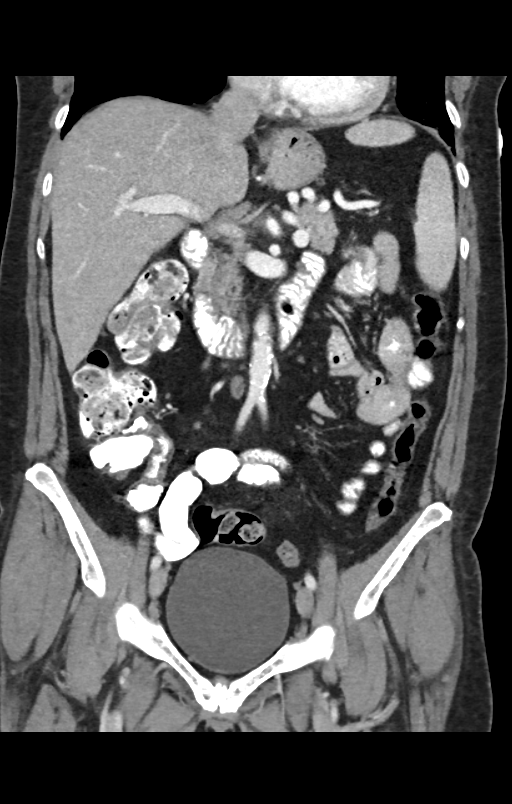
[im 64/115  soft-tissue]
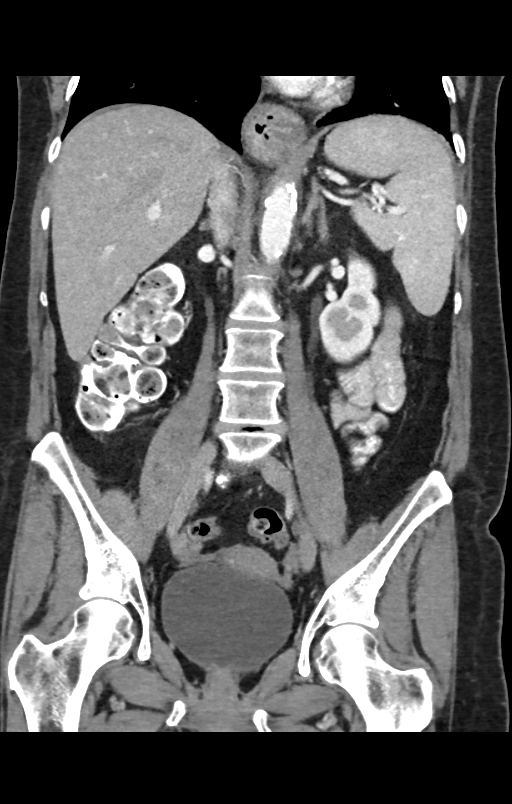

[14 of 46 positions shown; findings below may reference images not displayed]

FINDINGS: There are prominent dependent opacities both lower lobes. Small a
moderate size hiatal hernia noted. Heavy atherosclerotic
calcification of the visualized portion of the coronary arteries.

There is mild fatty infiltration of the normal size liver. No focal
hepatic lesion or biliary ductal dilatation. The spleen, adrenal
glands, and pancreas are within normal limits. There is mild
thinning of the renal cortices. Stable 10 mm right renal cyst in the
midpole. Both kidneys have extrarenal pelves, a normal anatomic
variant. Negative for hydronephrosis. The ureters are normal in
caliber. Normal appearance of the urinary bladder. Uterus and
ovaries are normal.

Distal stomach is decompressed. Proximal stomach partially herniated
through the diaphragmatic hiatus. Small bowel loops are normal in
caliber and wall thickness.

The appendix is well visualized, normal in size, and contains oral
contrast within its lumen. No evidence of acute appendiceal
inflammation. Colon is normal in caliber. There is a moderate amount
of stool in the proximal to mid colon.

Negative for ascites or lymphadenopathy.

The abdominal aorta contains calcified and noncalcified of plaque.
The aorta is normal in caliber. The branch vessels of the aorta are
patent.

Degenerative disc disease noted in the lumbar spine, most prominent
at L4-5 and L5-S1. There is a posterior disc osteophyte complex at
L5-S1. Negative for fracture or suspicious bony abnormality. There
are degenerative changes of the pubic symphysis.
IMPRESSION: 1. No acute findings identified in the abdomen or pelvis.
Specifically, the appendix is normal.
2. Opacities in the dependent portions of both lower lobes could
reflect fairly extensive atelectasis or bilateral dependent lower
lobe airspace disease. If there is clinical concern for pneumonia or
aspiration, consider followup two-view chest radiograph.
3. Fairly prominent amount of stool in the proximal to mid colon.
4. Small to moderate hiatal hernia.
5. Coronary artery atherosclerosis.
6. Fatty infiltration of the liver.
7. Stable right renal cyst.

## 2015-10-05 MED ORDER — NA SULFATE-K SULFATE-MG SULF 17.5-3.13-1.6 GM/177ML PO SOLN: 1.0000 | Freq: Once | ORAL | Status: DC

## 2015-10-05 NOTE — Patient Instructions (Signed)
Take Miralax, 17 grams in 8 oz of water daily.  Take the Protonix 40 mg ( Pantoprazole sodium).   You have been scheduled for an endoscopy and colonoscopy. Please follow the written instructions given to you at your visit today. Please pick up your prep supplies at the pharmacy within the next 1-3 days. [address].  If you use inhalers (even only as needed), please bring them with you on the day of your procedure. Your physician has requested that you go to www.startemmi.com and enter the access code given to you at your visit today. This web site gives a general overview about your procedure. However, you should still follow specific instructions given to you by our office regarding your preparation for the procedure.

## 2015-10-05 NOTE — Telephone Encounter (Signed)
Michele [name] is calling because she is to stop her palvix for 5 days prior to a procedure on 10/16/15, she is wanting to know can she take a baby aspirin in the meantime( She is allergic to a baby aspirin) . Please call   Thanks

## 2015-10-05 NOTE — Telephone Encounter (Signed)
Returned call to patient she wanted to ask Dr.Harding if ok to hold Plavix 5 days before colonoscopy and endoscopy scheduled  10/16/15.Stated if [facility] [place] says ok to hold does he want her to take a 81 mg aspirin.Message sent to [place] for advice.

## 2015-10-05 NOTE — Progress Notes (Signed)
i agree with the above note, plan 

## 2015-10-05 NOTE — Telephone Encounter (Signed)
  10/05/2015   RE: Michele [name] DOB: 07/05/1955 MRN: [id number]   Dear Dr. Glenetta Hew,    We have scheduled the above patient for an endoscopic procedure. Our records show that she is on anticoagulation therapy.   Please advise as to how long the patient may come off her therapy of Plavix prior to the procedure, which is scheduled for 10-16-2015. The patient wants to know if she should take a baby aspirin while off the Plavix.  [name]  Please fax back/ or route the completed form to [place] at [phone].   Sincerely,   Amy Esterwood PA-C

## 2015-10-05 NOTE — Progress Notes (Signed)
Patient ID: Michele [name], female   DOB: 08/16/1955, 60 y.o.   MRN: [id number]   Subjective:    Patient ID: Michele [name], female    DOB: Jan 06, 1956, 60 y.o.   MRN: [id number]  HPI  [place] is a 60 year old white female known to Dr. Ardis Hughs. She comes in today with complaints of right lower quadrant pain constipation and recent issues with dysphagia. She had an ER visit on 09/26/2015 with complaints of constipation. She did have a KUB done which was unremarkable. Prior to that she had CT on 08/03/2015 which was negative. He has also been to see a gynecologist within the past week or so because of this chronic right lower quadrant pain is was felt to be of non-GYN etiology but pelvic ultrasound is pending. Patient says that she's been having right lower quadrant pain over the past 2 years, always aware of it but sometimes more intense than others and has been bothering her more recently. She does not feel that this is exacerbated by by mouth intake not seem to be positional. It is not sure that it is a more noticeable when she is constipated. She's been having a lot of trouble with her bowels recently says when she went to the emergency room her whole abdomen was sore. Takes MiraLAX periodically usually stool softeners. No melena or hematochezia. She Is also concerned because she's been gradually been losing weight over the past year and is down from 150-138 today. Says over the past couple of months she been having some trouble with dysphagia particularly to pills and also had some difficulty with solids and liquids sticking intermittently. Taking Protonix regularly and says her swallowing has been better for the past week or so. Patient is on chronic Plavix with history of coronary artery disease status post stent in 2013 also with history of coronary spasm, COPD anxiety and seizure disorder. 9 She had EGD in 2008 showing only a 3 cm hiatal hernia last colonoscopy June 2012 history of tubular adenomas.  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  Review of Systems Pertinent positive and negative review of systems were noted in the above HPI section.  All other review of systems was otherwise negative.  Outpatient Encounter Prescriptions as of 10/05/2015  Medication Sig  . Calcium Carbonate-Vitamin D (CALCIUM 600+D PO) Take 1 tablet by mouth 2 (two) times daily.   . carvedilol (COREG) 3.125 MG tablet Take 1 tablet (3.125 mg total) by mouth 2 (two) times daily.  . clonazePAM (KLONOPIN) 2 MG tablet Take 2 mg by mouth 3 (three) times daily as needed for anxiety.   . clopidogrel (PLAVIX) 75 MG tablet TAKE 1 TABLET(75 MG) BY MOUTH DAILY  . Coenzyme Q10 (COQ10) 100 MG CAPS Take 100 mg by mouth daily.   . isosorbide mononitrate (IMDUR) 30 MG 24 hr tablet Take 1 tablet (30 mg total) by mouth at bedtime.  . nicotine (NICOTROL) 10 MG inhaler Inhale 1 cartridge (1 continuous puffing total) into the lungs as needed for smoking cessation.  [name] NITROSTAT 0.4 MG SL tablet PLACE 1 TABLET UNDER TONGUE AS NEEDED FOR CHEST PAIN EVERY 5 MINUTES UP TO 3 TIMES AS NEEDED  . Omega-3 Fatty Acids (FISH OIL) 1000 MG CAPS Take 2 capsules (2,000 mg total) by mouth 2 (two) times daily. (Patient taking differently: Take 1,000 mg by mouth 2 (two) times daily. )  . pantoprazole (PROTONIX) 40 MG tablet TAKE 1 TABLET BY MOUTH DAILY  .  polyethylene glycol powder (GLYCOLAX/MIRALAX) powder Take 17 g by mouth daily as needed for moderate constipation (may take one capful mixed in 6 oz of liquid up to three times daily).  . pravastatin (PRAVACHOL) 40 MG tablet Take 1 tablet (40 mg total) by mouth every evening.  . tiotropium (SPIRIVA) 18 MCG inhalation capsule Place 18 mcg into inhaler and inhale daily.  . Na Sulfate-K Sulfate-Mg Sulf SOLN Take 1 kit by mouth once.  . [DISCONTINUED] azithromycin (ZITHROMAX) 250 MG tablet Take 2 po the first day then once a day for the next 4 days. (Patient not taking:  Reported on 10/05/2015)  . [DISCONTINUED] cyclobenzaprine (FLEXERIL) 5 MG tablet Take 1 tablet (5 mg total) by mouth 3 (three) times daily as needed (muscle soreness). (Patient not taking: Reported on 10/05/2015)  . [DISCONTINUED] ezetimibe (ZETIA) 10 MG tablet Take 1 tablet (10 mg total) by mouth daily. (Patient not taking: Reported on 10/05/2015)  . [DISCONTINUED] predniSONE (DELTASONE) 20 MG tablet Take 3 po QD x 3d , then 2 po QD x 3d then 1 po QD x 3d (Patient not taking: Reported on 10/05/2015)   No facility-administered encounter medications on file as of 10/05/2015.   Allergies  Allergen Reactions  . Ibuprofen Other (See Comments)    GI upset  . Aspirin Other (See Comments)     GI upset (takes low dose aspirin at night) - Stomach ache - No stomach bleed Pt can take enteric coated  . Ciprofloxacin Other (See Comments)    Low blood sugar, shakiness  . Crestor [Rosuvastatin] Other (See Comments)    Myalgias   . Lipitor [Atorvastatin] Other (See Comments)    Myalgias   . Plavix [Clopidogrel Bisulfate] Other (See Comments)    NON-RESPONDER   202 ASSAY  . Sulfonamide Derivatives Itching and Rash  . Wellbutrin [Bupropion] Palpitations   Patient Active Problem List   Diagnosis Date Noted  . Stable angina ([place]) 02/25/2015  . Stenosis of coronary stent 02/25/2015  . Abdominal pain in female patient 11/24/2014  . Chronic fatigue and malaise 04/10/2014  . Heart palpitations 11/28/2013  . Essential hypertension 05/30/2013  . Coronary artery spasm, hx of 04/30/2013  . Acrocyanosis ([place]) 01/01/2013  . Tobacco abuse, ongoing   . Edema of upper extremity 11/30/2012  . Lipoma of lower extremity 10/23/2012  . CAD -S/P MI-PCI AVG 5/13 then staged DES to RCA  11/06/11. ISR- PCI 11/19/12, cath 02/18/13- no ISR, + spasm, Myoview low risk Nov 2014 10/10/2011    Class: Diagnosis of  . Osteopenia 07/30/2011  . HERPES SIMPLEX INFECTION 06/28/2009  . VITAMIN D DEFICIENCY 11/28/2008  . INSOMNIA 10/01/2007    . HYPERGLYCEMIA 08/30/2007  . Dyslipidemia, goal LDL below 70 08/10/2007  . COPD (chronic obstructive pulmonary disease) ([place]) 12/23/2006  . Anxiety state 06/11/2006    Class: Diagnosis of  . Depression 06/11/2006  . RESTLESS LEG SYNDROME 06/11/2006  . GERD 06/11/2006  . KNEE PAIN, CHRONIC 06/11/2006  . SPONDYLOSIS, CERVICAL, WITH RADICULOPATHY 06/11/2006  . LOW BACK PAIN 06/11/2006  . SEIZURE DISORDER 06/11/2006    Class: Diagnosis of   Social History   Social History  . Marital Status: Divorced    Spouse Name: N/A  . Number of Children: 5  . Years of Education: N/A   Occupational History  . Disabled     Social History Main Topics  . Smoking status: Current Every Day Smoker -- 2.00 packs/day for 40 years    Types: Cigarettes  . Smokeless tobacco:  Never Used     Comment: 04/15/12 "I quit once for 2 1/2 years; smoking cessation counselor already here to visit"; done to less than 1/2 ppd (03/02/2013) - "1 pack per week" - 05/24/13  . Alcohol Use: No     Comment: occasional  . Drug Use: No  . Sexual Activity: Not Currently    Birth Control/ Protection: Post-menopausal   Other Topics Concern  . Not on file   Social History Narrative   Divorced mother of 82 and a grandmother 41, great-grandmother of 1    On disability, previously worked as a Educational psychologist.  Quit smoking 06/2007 but restarted 1/11 -- smoking a pack a day.  -- now a pack lasts a week.   Does not drink alcohol.   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   0 Caffeine drinks daily     Ms. Kreiser's family history includes Asthma in her mother; Cancer in her maternal grandmother; Colon polyps in her mother; Diabetes in her mother; Emphysema in her mother; Heart attack in her maternal grandfather; Heart disease in her father and mother; Hyperlipidemia in her mother; Hypertension in her mother; Kidney cancer in her brother; Stomach cancer in her brother and brother; Stroke in her mother;  Stroke (age of onset: 19) in her brother. There is no history of Colon cancer.      Objective:    Filed Vitals:   10/05/15 1035  BP: 130/80  Pulse: 86    Physical Exam  well-developed older white female in no acute distress, accompanied by her husband blood pressure 130/80 pulse 86 height 5 foot 5 weight 138 good HEENT ;nontraumatic normocephalic EOMI PERRLA sclera anicteric, Cardiovascular ;regular rate and rhythm with S1-S2 no murmur or gallop, Pulmonary clear bilaterally, smoker's cough, Abdomen ;soft, bowel sounds are present she has some very mild tenderness in the right lower quadrant there is no guarding or rebound no palpable mass or hepatosplenomegaly bowel sounds are present, Rectal; exam not done, Ext;no clubbing cyanosis or edema skin warm dry, Neuropsych ;mood and affect appropriate     Assessment & Plan:   #1 60 yo Female with complaints of worsening constipation and two-year history of right lower quadrant pain, worse over the past couple of months. Workup to date has been unrevealing with plain abdominal films and CT of the abdomen and pelvis. Pelvic ultrasound per GYN is pending Is not clear that her pain is of GI etiology, however with history of serrated adenomas on last colonoscopy will rule out occult colon lesion #2 chronic constipation #3 GERD with recent dysphagia-rule out stricture #4 weight loss #5 COPD #6 coronary artery disease status post stent 2013 #7 chronic antiplatelet therapy on Plavix #8 seizure disorder   Plan; patient is due for follow-up colonoscopy, will schedule colonoscopy and EGD with possible esophageal dilation with Dr. Ardis Hughs. Both procedures were discussed in detail with patient and she is agreeable to proceed. She will need to hold Plavix for 5 days prior to the procedure. Risks and benefits of holding Plavix were discussed with patient. We will communicate with her cardiologist Dr. [name] to assure that this is reasonable for this  patient . Continue Protonix 40 mg by mouth every morning Add MiraLAX 17 g in 8 ounces of water daily.     Amy S Esterwood PA-C 10/05/2015   Cc: [name], MD

## 2015-10-06 NOTE — Telephone Encounter (Signed)
OK to hold Plavix for procedures. Restart when safe.  Would not do ASA if she has had issue with it in the past. Should be OK.  Leonie Man, MD

## 2015-10-06 NOTE — Telephone Encounter (Signed)
Returned call to patient no answer.LMTC. 

## 2015-10-06 NOTE — Telephone Encounter (Signed)
OK to hold plavix for 5-7 days pre-procedure.  Leonie Man, MD

## 2015-10-07 ENCOUNTER — Other Ambulatory Visit: Payer: Self-pay | Admitting: Internal Medicine

## 2015-10-09 ENCOUNTER — Other Ambulatory Visit: Payer: Self-pay

## 2015-10-09 MED ORDER — CARVEDILOL 6.25 MG PO TABS: 6.2500 mg | ORAL_TABLET | Freq: Two times a day (BID) | ORAL | Status: DC

## 2015-10-09 NOTE — Telephone Encounter (Signed)
Rx request sent to pharmacy.  

## 2015-10-09 NOTE — Telephone Encounter (Signed)
Lm for the patient advising her we heard back from Dr. [name].  She can hold the Plavix on 5-10 and stay off it 5 days in including the procedure date of 10-16-2015. I advised her Dr. Ardis Hughs can tell her when to resume it.  I LM for her to call me if she has any questions.

## 2015-10-09 NOTE — Telephone Encounter (Signed)
Returned call. Pt advised of recommendations, voiced understanding of instructions. Advised her to call if further questions.

## 2015-10-09 NOTE — Telephone Encounter (Signed)
The patient called back to clarfy our instructions for the Plavix.  I assured her Dr. [name] responded and and said for her to hold the plavix 5-7 days.    Advised that she can hold the Plavix starting on 10-11-2015  for 6 days prior to the procedure date of 10-16-2015 , which would be a total af 6 days.  She verbalized understanding the instructions.

## 2015-10-09 NOTE — Telephone Encounter (Signed)
Follow up   Pt calling returning call for rn

## 2015-10-09 NOTE — Telephone Encounter (Signed)
Rx Refill

## 2015-10-10 ENCOUNTER — Telehealth: Payer: Self-pay | Admitting: Cardiology

## 2015-10-10 ENCOUNTER — Ambulatory Visit (HOSPITAL_COMMUNITY)
Admission: RE | Admit: 2015-10-10 | Discharge: 2015-10-10 | Disposition: A | Discharge status: Home / Self Care | Payer: Medicare Other | Source: Ambulatory Visit | Attending: Obstetrics & Gynecology | Admitting: Obstetrics & Gynecology

## 2015-10-10 DIAGNOSIS — Z780 Asymptomatic menopausal state: Secondary | ICD-10-CM | POA: Insufficient documentation

## 2015-10-10 DIAGNOSIS — Z9851 Tubal ligation status: Secondary | ICD-10-CM | POA: Insufficient documentation

## 2015-10-10 DIAGNOSIS — R102 Pelvic and perineal pain: Secondary | ICD-10-CM

## 2015-10-10 DIAGNOSIS — K5900 Constipation, unspecified: Secondary | ICD-10-CM | POA: Diagnosis not present

## 2015-10-10 DIAGNOSIS — R1031 Right lower quadrant pain: Secondary | ICD-10-CM | POA: Diagnosis not present

## 2015-10-10 NOTE — Telephone Encounter (Signed)
Pt reports she was dizzy for a few seconds and felt like she was going to pass out when lying on exam table at [facility] today.  No assoc syncope No CP, SOB, nausea, etc. Patient checked CBG - it was OK.  No BP checked when this happened. Pt reports adequate fluid intake. She denies any other concerns. Wanted Dr. Allison Quarry advice on whether she needs to be seen or if anything would change on recommended regimen (stopping Plavix tomorrow for her colonoscopy next week)  Informed pt I would defer to physician for advice.

## 2015-10-10 NOTE — Telephone Encounter (Signed)
New Message  Pt is sched to have colonoscopy/endoscopy on 5/15- stated she is planning on stopping meds tomorrow- 5/9- pt c/o weakness and almost passing out- she wanted to know if this changes anything going forward w/ surgery. Please call back and discuss.

## 2015-10-11 NOTE — Telephone Encounter (Signed)
Called pt back and gave her Dr Allison Quarry recommendations and advice. Pt verbalized understanding.

## 2015-10-11 NOTE — Telephone Encounter (Signed)
She has baseline low blood pressure. I suspect that this was possibly orthostatic hypotension episode probably made a little bit worse with the fact that she was having a GYN exam. She may have had a vagal component as well.  May not be a bad idea for her to hold the Coreg if she is going for these studies. Probably hold Coreg prior to her colonoscopy.  Should not be overly alarmed.  Her BP is usually low.  Michele Man, MD

## 2015-10-13 ENCOUNTER — Telehealth: Payer: Self-pay

## 2015-10-13 NOTE — Telephone Encounter (Signed)
Pt would like U/S results that Dr.Dove ordered

## 2015-10-16 ENCOUNTER — Ambulatory Visit (AMBULATORY_SURGERY_CENTER): Payer: Medicare Other | Admitting: Gastroenterology

## 2015-10-16 ENCOUNTER — Encounter: Payer: Self-pay | Admitting: Gastroenterology

## 2015-10-16 VITALS — BP 128/60 | HR 71 | Temp 98.4°F | Resp 10 | Ht 65.0 in | Wt 138.0 lb

## 2015-10-16 DIAGNOSIS — D124 Benign neoplasm of descending colon: Secondary | ICD-10-CM | POA: Diagnosis not present

## 2015-10-16 DIAGNOSIS — K222 Esophageal obstruction: Secondary | ICD-10-CM | POA: Diagnosis not present

## 2015-10-16 DIAGNOSIS — R1031 Right lower quadrant pain: Secondary | ICD-10-CM

## 2015-10-16 DIAGNOSIS — F419 Anxiety disorder, unspecified: Secondary | ICD-10-CM | POA: Diagnosis not present

## 2015-10-16 DIAGNOSIS — R1310 Dysphagia, unspecified: Secondary | ICD-10-CM

## 2015-10-16 DIAGNOSIS — D125 Benign neoplasm of sigmoid colon: Secondary | ICD-10-CM

## 2015-10-16 DIAGNOSIS — I10 Essential (primary) hypertension: Secondary | ICD-10-CM | POA: Diagnosis not present

## 2015-10-16 DIAGNOSIS — K219 Gastro-esophageal reflux disease without esophagitis: Secondary | ICD-10-CM | POA: Diagnosis not present

## 2015-10-16 DIAGNOSIS — Z86010 Personal history of colonic polyps: Secondary | ICD-10-CM | POA: Diagnosis not present

## 2015-10-16 DIAGNOSIS — J449 Chronic obstructive pulmonary disease, unspecified: Secondary | ICD-10-CM | POA: Diagnosis not present

## 2015-10-16 DIAGNOSIS — I2510 Atherosclerotic heart disease of native coronary artery without angina pectoris: Secondary | ICD-10-CM | POA: Diagnosis not present

## 2015-10-16 DIAGNOSIS — I252 Old myocardial infarction: Secondary | ICD-10-CM | POA: Diagnosis not present

## 2015-10-16 DIAGNOSIS — R1012 Left upper quadrant pain: Secondary | ICD-10-CM | POA: Diagnosis not present

## 2015-10-16 MED ORDER — SODIUM CHLORIDE 0.9 % IV SOLN
500.0000 mL | INTRAVENOUS | Status: DC
Start: 2015-10-16 — End: 2015-10-16

## 2015-10-16 NOTE — Op Note (Signed)
[place] Patient Name: Michele [name] Procedure Date: 10/16/2015 2:20 PM MRN: [id number] Endoscopist: Milus Banister , MD Age: 60 Referring MD:  Date of Birth: [birth year] Gender: Female Procedure:                Colonoscopy Indications:              Abdominal pain in the right lower quadrant Medicines:                Monitored Anesthesia Care Procedure:                Pre-Anesthesia Assessment:                           - Prior to the procedure, a History and Physical                            was performed, and patient medications and                            allergies were reviewed. The patient's tolerance of                            previous anesthesia was also reviewed. The risks                            and benefits of the procedure and the sedation                            options and risks were discussed with the patient.                            All questions were answered, and informed consent                            was obtained. Prior Anticoagulants: The patient has                            taken Plavix (clopidogrel), last dose was 5 days                            prior to procedure. ASA Grade Assessment: II - A                            patient with mild systemic disease. After reviewing                            the risks and benefits, the patient was deemed in                            satisfactory condition to undergo the procedure.                           After obtaining informed consent, the colonoscope  was passed under direct vision. Throughout the                            procedure, the patient's blood pressure, pulse, and                            oxygen saturations were monitored continuously. The                            Model CF-HQ190L [id number]) scope was introduced                            through the anus and advanced to the the cecum,                            identified by appendiceal  orifice and ileocecal                            valve. The colonoscopy was performed without                            difficulty. The patient tolerated the procedure                            well. The quality of the bowel preparation was                            excellent. The ileocecal valve, appendiceal                            orifice, and rectum were photographed. Scope In: 2:54:28 PM Scope Out: 3:01:51 PM Scope Withdrawal Time: 0 hours 6 minutes 8 seconds  Total Procedure Duration: 0 hours 7 minutes 23 seconds  Findings:                 Two sessile polyps were found in the sigmoid colon                            and descending colon. The polyps were 3 to 4 mm in                            size. These polyps were removed with a cold snare.                            Resection and retrieval were complete.                           The exam was otherwise without abnormality on                            direct and retroflexion views. Complications:            No immediate complications. Estimated blood loss:  None. Estimated Blood Loss:     Estimated blood loss: none. Impression:               - Two 3 to 4 mm polyps in the sigmoid colon and in                            the descending colon, removed with a cold snare.                            Resected and retrieved.                           - The examination was otherwise normal on direct                            and retroflexion views. Recommendation:           - Patient has a contact number available for                            emergencies. The signs and symptoms of potential                            delayed complications were discussed with the                            patient. Return to normal activities tomorrow.                            Written discharge instructions were provided to the                            patient.                           - Resume previous diet.                            - Continue present medications.                           You will receive a letter within 2-3 weeks with the                            pathology results and my final recommendations.                           If the polyp(s) is proven to be 'pre-cancerous' on                            pathology, you will need repeat colonoscopy in 5                            years. If the polyp(s) is NOT 'precancerous' on  pathology then you should repeat colon cancer                            screening in 10 years with colonoscopy without need                            for colon cancer screening by any method prior to                            then (including stool testing). Milus Banister, MD 10/16/2015 3:04:19 PM This report has been signed electronically.

## 2015-10-16 NOTE — Op Note (Signed)
[facility] Patient Name: Michele [name] Procedure Date: 10/16/2015 2:19 PM MRN: [id number] Endoscopist: Milus Banister , MD Age: 59 Referring MD:  Date of Birth: 1955-11-11 Gender: Female Procedure:                Upper GI endoscopy Indications:              Abdominal pain in the right lower quadrant,                            Dysphagia Medicines:                Monitored Anesthesia Care Procedure:                Pre-Anesthesia Assessment:                           - Prior to the procedure, a History and Physical                            was performed, and patient medications and                            allergies were reviewed. The patient's tolerance of                            previous anesthesia was also reviewed. The risks                            and benefits of the procedure and the sedation                            options and risks were discussed with the patient.                            All questions were answered, and informed consent                            was obtained. Prior Anticoagulants: The patient has                            taken Plavix (clopidogrel), last dose was 5 days                            prior to procedure. ASA Grade Assessment: III - A                            patient with severe systemic disease. After                            reviewing the risks and benefits, the patient was                            deemed in satisfactory condition to undergo the  procedure.                           After obtaining informed consent, the endoscope was                            passed under direct vision. Throughout the                            procedure, the patient's blood pressure, pulse, and                            oxygen saturations were monitored continuously. The                            Model GIF-[id number] scope was introduced                            through the mouth, and  advanced to the second part                            of duodenum. The upper GI endoscopy was                            accomplished without difficulty. The patient                            tolerated the procedure well. Scope In: Scope Out: Findings:                 One mild benign-appearing, intrinsic stenosis was                            found at the gastroesophageal junction. And was                            traversed. A TTS dilator was passed through the                            scope. Dilation with an 18-19-20 mm balloon dilator                            was performed to 20 mm. The dilation site was                            examined and showed typical superficial mucosal                            tear and self limited oozing. Estimated blood loss                            was minimal.                           The stomach was normal.  The examined duodenum was normal. Complications:            No immediate complications. Estimated blood loss:                            Minimal. Estimated Blood Loss:     Estimated blood loss was minimal. Impression:               - Benign-appearing esophageal stenosis. Dilated.                           - Normal stomach.                           - Normal examined duodenum.                           - No specimens collected. Recommendation:           - Patient has a contact number available for                            emergencies. The signs and symptoms of potential                            delayed complications were discussed with the                            patient. Return to normal activities tomorrow.                            Written discharge instructions were provided to the                            patient.                           - Resume previous diet.                           - Continue present medications.                           - No repeat upper endoscopy.                            - Please resume your plavix tomorrow. Milus Banister, MD 10/16/2015 3:18:57 PM This report has been signed electronically.

## 2015-10-16 NOTE — Patient Instructions (Signed)
Discharge instructions given. Handouts on polyps and a dilatation diet. Resume previous medications. YOU HAD AN ENDOSCOPIC PROCEDURE TODAY AT [facility]:   Refer to the procedure report that was given to you for any specific questions about what was found during the examination.  If the procedure report does not answer your questions, please call your gastroenterologist to clarify.  If you requested that your care partner not be given the details of your procedure findings, then the procedure report has been included in a sealed envelope for you to review at your convenience later.  YOU SHOULD EXPECT: Some feelings of bloating in the abdomen. Passage of more gas than usual.  Walking can help get rid of the air that was put into your GI tract during the procedure and reduce the bloating. If you had a lower endoscopy (such as a colonoscopy or flexible sigmoidoscopy) you may notice spotting of blood in your stool or on the toilet paper. If you underwent a bowel prep for your procedure, you may not have a normal bowel movement for a few days.  Please Note:  You might notice some irritation and congestion in your nose or some drainage.  This is from the oxygen used during your procedure.  There is no need for concern and it should clear up in a day or so.  SYMPTOMS TO REPORT IMMEDIATELY:   Following lower endoscopy (colonoscopy or flexible sigmoidoscopy):  Excessive amounts of blood in the stool  Significant tenderness or worsening of abdominal pains  Swelling of the abdomen that is new, acute  Fever of 100F or higher   Following upper endoscopy (EGD)  Vomiting of blood or coffee ground material  New chest pain or pain under the shoulder blades  Painful or persistently difficult swallowing  New shortness of breath  Fever of 100F or higher  Black, tarry-looking stools  For urgent or emergent issues, a gastroenterologist can be reached at any hour by calling [phone]  [phone].   DIET: Your first meal following the procedure should be a small meal and then it is ok to progress to your normal diet. Heavy or fried foods are harder to digest and may make you feel nauseous or bloated.  Likewise, meals heavy in dairy and vegetables can increase bloating.  Drink plenty of fluids but you should avoid alcoholic beverages for 24 hours.  ACTIVITY:  You should plan to take it easy for the rest of today and you should NOT DRIVE or use heavy machinery until tomorrow (because of the sedation medicines used during the test).    FOLLOW UP: Our staff will call the number listed on your records the next business day following your procedure to check on you and address any questions or concerns that you may have regarding the information given to you following your procedure. If we do not reach you, we will leave a message.  However, if you are feeling well and you are not experiencing any problems, there is no need to return our call.  We will assume that you have returned to your regular daily activities without incident.  If any biopsies were taken you will be contacted by phone or by letter within the next 1-3 weeks.  Please call us at [phone] if you have not heard about the biopsies in 3 weeks.    SIGNATURES/CONFIDENTIALITY: You and/or your care partner have signed paperwork which will be entered into your electronic medical record.  These signatures attest to the fact  that that the information above on your After Visit Summary has been reviewed and is understood.  Full responsibility of the confidentiality of this discharge information lies with you and/or your care-partner.

## 2015-10-16 NOTE — Progress Notes (Signed)
Called to room to assist during endoscopic procedure.  Patient ID and intended procedure confirmed with present staff. Received instructions for my participation in the procedure from the performing physician.  

## 2015-10-16 NOTE — Progress Notes (Signed)
Report to PACU, RN, vss, BBS= Clear.  

## 2015-10-17 ENCOUNTER — Telehealth: Payer: Self-pay

## 2015-10-17 NOTE — Telephone Encounter (Signed)
Michele [name] called back this am for results. I reviewed results and chart with Dr. Roselie Awkward and called [name] with results- ultrasound normal, does not show a gyn reason for her pain. She states she just went to GI doctor and had normal endoscopy and colonoscopy. I advised her to go to her PCP and discuss her pain. She can get results/notes from our office if needed. She voices understanding.

## 2015-10-17 NOTE — Telephone Encounter (Signed)
Received a call from [facility] that Michele [name] called them and is wanting her [place] results- states no one called her back from the clinic.

## 2015-10-17 NOTE — Telephone Encounter (Signed)
Pt left additional message today requesting results of ultrasound performed on 5/9.

## 2015-10-17 NOTE — Telephone Encounter (Signed)
I sent Dr.Dove a message to review results last week.

## 2015-10-17 NOTE — Telephone Encounter (Signed)
  Follow up Call-  Call back number 10/16/2015  Post procedure Call Back phone  # [phone]  Permission to leave phone message Yes    Patient was called for follow up after her procedure on 10/16/2015. Patient was confused about her Endoscopy report due to words that she did not understand. I spent a great deal of time speaking with the patient regarding her report. Patient states that she has returned to her normal daily activities however when she took her pills this morning it felt like the pill went to the right and did not go down very easily. I told the patient that I would speak to Dr. Ardis Hughs and call her back.

## 2015-10-22 ENCOUNTER — Encounter: Payer: Self-pay | Admitting: Gastroenterology

## 2015-10-27 ENCOUNTER — Telehealth: Payer: Self-pay | Admitting: Gastroenterology

## 2015-10-27 NOTE — Telephone Encounter (Signed)
The pt was advised that the polyps removed were not cancerous but pre- cancerous and had they not been removed potentially could have turned into cancer.  She is aware that a recall colon is due in 5 years.  Pt verbalized understanding and will call with any further questions or concerns and also she will call if she develops any symptoms

## 2015-11-02 ENCOUNTER — Other Ambulatory Visit: Payer: Self-pay | Admitting: Cardiology

## 2015-11-05 ENCOUNTER — Other Ambulatory Visit: Payer: Self-pay | Admitting: Cardiology

## 2015-11-06 NOTE — Telephone Encounter (Signed)
Rx(s) sent to pharmacy electronically.  

## 2015-11-07 ENCOUNTER — Telehealth: Payer: Self-pay | Admitting: Cardiology

## 2015-11-07 ENCOUNTER — Encounter (INDEPENDENT_AMBULATORY_CARE_PROVIDER_SITE_OTHER): Payer: Medicare Other

## 2015-11-07 ENCOUNTER — Encounter: Payer: Self-pay | Admitting: Cardiology

## 2015-11-07 ENCOUNTER — Ambulatory Visit (INDEPENDENT_AMBULATORY_CARE_PROVIDER_SITE_OTHER): Payer: Medicare Other | Admitting: Cardiology

## 2015-11-07 VITALS — BP 140/84 | HR 86 | Ht 66.0 in | Wt 137.8 lb

## 2015-11-07 DIAGNOSIS — Z9861 Coronary angioplasty status: Secondary | ICD-10-CM

## 2015-11-07 DIAGNOSIS — R002 Palpitations: Secondary | ICD-10-CM

## 2015-11-07 DIAGNOSIS — I208 Other forms of angina pectoris: Secondary | ICD-10-CM

## 2015-11-07 DIAGNOSIS — F329 Major depressive disorder, single episode, unspecified: Secondary | ICD-10-CM | POA: Diagnosis not present

## 2015-11-07 DIAGNOSIS — I10 Essential (primary) hypertension: Secondary | ICD-10-CM | POA: Diagnosis not present

## 2015-11-07 DIAGNOSIS — F411 Generalized anxiety disorder: Secondary | ICD-10-CM | POA: Diagnosis not present

## 2015-11-07 DIAGNOSIS — I2510 Atherosclerotic heart disease of native coronary artery without angina pectoris: Secondary | ICD-10-CM | POA: Diagnosis not present

## 2015-11-07 DIAGNOSIS — I2119 ST elevation (STEMI) myocardial infarction involving other coronary artery of inferior wall: Secondary | ICD-10-CM

## 2015-11-07 DIAGNOSIS — F32A Depression, unspecified: Secondary | ICD-10-CM

## 2015-11-07 DIAGNOSIS — I201 Angina pectoris with documented spasm: Secondary | ICD-10-CM

## 2015-11-07 DIAGNOSIS — E785 Hyperlipidemia, unspecified: Secondary | ICD-10-CM

## 2015-11-07 MED ORDER — CITALOPRAM HYDROBROMIDE 20 MG PO TABS: 20.0000 mg | ORAL_TABLET | Freq: Every day | ORAL | Status: DC

## 2015-11-07 NOTE — Telephone Encounter (Signed)
Pt called concerned about dose and name of celexa- she was given generic and I explained twice it was same medication.  But she stated she was told it would be 10 mg and she has 20 mg tabs.  She is also concerned about the arrhythmia and that celexa can cause death-per package insert and arrhthymias  I asked her to hold the meds until Dr. Darcus Pester office got back to her.  She agreed.
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
## 2015-11-07 NOTE — Patient Instructions (Addendum)
MEDICATIONS-- GENERIC CELEXA 20 MG ONE TABLET DAILY  Your physician has recommended that you wear an event monitor wear for 30 days. Event monitors are medical devices that record the heart's electrical activity. Doctors most often [place] these monitors to diagnose arrhythmias. Arrhythmias are problems with the speed or rhythm of the heartbeat. The monitor is a small, portable device. You can wear one while you do your normal daily activities. This is usually used to diagnose what is causing palpitations/syncope (passing out).    Your physician wants you to follow-up in [place] DR HARDING--30 MIN You will receive a reminder letter in the mail two months in advance. If you don't receive a letter, please call our office to schedule the follow-up appointment.  If you need a refill on your cardiac medications before your next appointment, please call your pharmacy.

## 2015-11-09 NOTE — Telephone Encounter (Signed)
Please tell her that this is exactly what we spent 45 minutes talking about. I will not be able to continue help her if she will not allow me to treat her.    Will defer this issue to her PCP.  Pls reschedule her for 6 month f/u then.  Glenetta Hew, MD

## 2015-11-09 NOTE — Telephone Encounter (Signed)
Called and spoke to patient. Reminded patient of after summary visit  Conversation She states she remembers  Informed patient of instruction below  Patient states she afraid to use any of the medications- due to the information on the packaging She states Dr [name] told her not to read it, but she did and now she does not want to take anything. RN informed patient will let Dr [name] know

## 2015-11-09 NOTE — Telephone Encounter (Signed)
Just start with 10 mg.  Very low risk of Arrhythmia.    Glenetta Hew

## 2015-11-13 NOTE — Assessment & Plan Note (Signed)
She has multiple stents, some which have occluded now. She had an abnormal Myoview evaluated with a cardiac catheterization confirming location of abnormality. Continue Plavix essentially lifelong. She is on carvedilol and Imdur, which we have not been all titrate up. She is still on statin, albeit only for now. He may very well have microvascular ischemia, and Ranexa would be a great option, but she has not been able to afford it.

## 2015-11-13 NOTE — Assessment & Plan Note (Signed)
With again talked about smoking cessation. She is on Imdur. No longer on Ranexa. Intolerant of calcium channel blocker.

## 2015-11-13 NOTE — Assessment & Plan Note (Signed)
At this point, I cannot continue to treat someone who refuses any options to treat her anxiety and depression. I tried to get her PCP to provide an SSRI,  but all she has is an anxiolytic and Klonopin. This is not treating the underlying cause.  I have tried to prescribe her now on SSRI, we will see how she takes the medication. If not, I really do not know that I can continue to treat her.

## 2015-11-13 NOTE — Assessment & Plan Note (Signed)
True sleeping no way of knowing if she truly is having anginal symptoms anymore. Moser symptoms seem to be psychosomatic and not anginal. She is on whatever I can have her on that she would tolerate including coenzyme Q 10. Every time I try to increase that to 300 mg, she will have some side effect.

## 2015-11-13 NOTE — Assessment & Plan Note (Signed)
Now with reverse the distal portion of this stented segment essentially occluded, she is not likely to having more recurrent episodes. She remains on standing dose of Plavix, carvedilol and pravastatin. Would continue current regimen.

## 2015-11-13 NOTE — Assessment & Plan Note (Signed)
I think she truly does have depression. Most of her symptoms are psychosomatic. I spent 40 minutes talking about this particular issue with her not to mention the remainder the 25 minutes is spent with her.  I told her I wanted her to try an SSRI, and I did not want her to read the package insert. Reviewing the options for least expensive medications, I chose Celexa - starting at one half a tablet daily.  Addendum: Despite our long conversation, the patient then read the package insert and refused to take it

## 2015-11-13 NOTE — Assessment & Plan Note (Signed)
Still not at goal. Very frustrating. We tried to start Zetia, but she said that she was "bumping into walls after she started it. Maybe we can get her on a PCSK9 inhibitor with financial assistance. Otherwise she will not do it.

## 2015-11-13 NOTE — Assessment & Plan Note (Signed)
Not at goal. Every time we try to increase the dose of any medication, she has complications so we just go back to where we are. I'm tired attack titrate. We will simply maintain where she is.

## 2015-11-13 NOTE — Telephone Encounter (Signed)
SPOKE TO PATIENT ATTEMPT TO REASSURE PATIENT,CONCERNING MEDICATION SHE STATES SHE IS AFRAID OF THE SIDE EFFECTS- RN  INFORMED PATIENT OF DR HARDING ATTENTION FOR HER TRY THE MEDICATIONS IF SHE IS CONCERNED ABOUT ARRHYTHMIA - THIS WOULD BE THE BEST TIME TO TRY MEDICATION WHILE  WEARING EVENT MONITOR.  RN RECOMMEND PATIENT TO CONTACT PRIMARY DR HARRIS - POSSIBLE REFERRAL FOR COUNSELING AND DISCUSS OTHER OPTIONS IF SHE DECIDE NOT TO USE MEDICATIONS OKAY TO TAKE 1/2 TABLET OF CELEXA, PER DR [place].  PATIENT STATES SHE WILL TRY.

## 2015-11-13 NOTE — Assessment & Plan Note (Signed)
She is now having recurrent episodes of palpitations. They seem to be happening with enough frequency that we can Monitor. We ordered a cardiac event monitor and follow. She is on a beta blocker. Maybe we can titrate that up if we have a reason being palpitations. In the past there was concern about bradycardia.

## 2015-11-16 ENCOUNTER — Encounter (HOSPITAL_COMMUNITY): Payer: Self-pay | Admitting: Emergency Medicine

## 2015-11-16 ENCOUNTER — Emergency Department (HOSPITAL_COMMUNITY)
Admission: EM | Admit: 2015-11-16 | Discharge: 2015-11-17 | Disposition: A | Discharge status: Home / Self Care | Payer: Medicare Other | Attending: Emergency Medicine | Admitting: Emergency Medicine

## 2015-11-16 DIAGNOSIS — R1084 Generalized abdominal pain: Secondary | ICD-10-CM | POA: Diagnosis not present

## 2015-11-16 DIAGNOSIS — Z79899 Other long term (current) drug therapy: Secondary | ICD-10-CM | POA: Diagnosis not present

## 2015-11-16 DIAGNOSIS — J449 Chronic obstructive pulmonary disease, unspecified: Secondary | ICD-10-CM | POA: Diagnosis not present

## 2015-11-16 DIAGNOSIS — N189 Chronic kidney disease, unspecified: Secondary | ICD-10-CM | POA: Insufficient documentation

## 2015-11-16 DIAGNOSIS — K449 Diaphragmatic hernia without obstruction or gangrene: Secondary | ICD-10-CM | POA: Diagnosis not present

## 2015-11-16 DIAGNOSIS — I252 Old myocardial infarction: Secondary | ICD-10-CM | POA: Diagnosis not present

## 2015-11-16 DIAGNOSIS — S6990XA Unspecified injury of unspecified wrist, hand and finger(s), initial encounter: Secondary | ICD-10-CM | POA: Diagnosis not present

## 2015-11-16 DIAGNOSIS — T6394XA Toxic effect of contact with unspecified venomous animal, undetermined, initial encounter: Secondary | ICD-10-CM | POA: Diagnosis not present

## 2015-11-16 DIAGNOSIS — I2510 Atherosclerotic heart disease of native coronary artery without angina pectoris: Secondary | ICD-10-CM | POA: Insufficient documentation

## 2015-11-16 DIAGNOSIS — R1030 Lower abdominal pain, unspecified: Secondary | ICD-10-CM | POA: Diagnosis not present

## 2015-11-16 DIAGNOSIS — F17210 Nicotine dependence, cigarettes, uncomplicated: Secondary | ICD-10-CM | POA: Diagnosis not present

## 2015-11-16 DIAGNOSIS — I129 Hypertensive chronic kidney disease with stage 1 through stage 4 chronic kidney disease, or unspecified chronic kidney disease: Secondary | ICD-10-CM | POA: Insufficient documentation

## 2015-11-16 DIAGNOSIS — T63301A Toxic effect of unspecified spider venom, accidental (unintentional), initial encounter: Secondary | ICD-10-CM | POA: Diagnosis not present

## 2015-11-16 NOTE — ED Notes (Signed)
Pt presents from home with [facility] for progressively worsening upper abd pain with nausea today; pt also endorses possible spider bite to RIGHT wrist; bruises noted throughout in various stages of healing
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
## 2015-11-17 ENCOUNTER — Telehealth: Payer: Self-pay | Admitting: Gastroenterology

## 2015-11-17 ENCOUNTER — Emergency Department (HOSPITAL_COMMUNITY): Payer: Medicare Other

## 2015-11-17 DIAGNOSIS — K449 Diaphragmatic hernia without obstruction or gangrene: Secondary | ICD-10-CM | POA: Diagnosis not present

## 2015-11-17 DIAGNOSIS — R1084 Generalized abdominal pain: Secondary | ICD-10-CM | POA: Diagnosis not present

## 2015-11-17 LAB — URINALYSIS, ROUTINE W REFLEX MICROSCOPIC
Bilirubin Urine: NEGATIVE
Glucose, UA: NEGATIVE mg/dL
Hgb urine dipstick: NEGATIVE
Ketones, ur: 15 mg/dL — AB
Leukocytes, UA: NEGATIVE
Nitrite: NEGATIVE
Protein, ur: NEGATIVE mg/dL
Specific Gravity, Urine: 1.008 (ref 1.005–1.030)
pH: 5.5 (ref 5.0–8.0)

## 2015-11-17 LAB — COMPREHENSIVE METABOLIC PANEL
ALT: 11 U/L — ABNORMAL LOW (ref 14–54)
AST: 30 U/L (ref 15–41)
Albumin: 3.5 g/dL (ref 3.5–5.0)
Alkaline Phosphatase: 51 U/L (ref 38–126)
Anion gap: 5 (ref 5–15)
BUN: 7 mg/dL (ref 6–20)
CO2: 26 mmol/L (ref 22–32)
Calcium: 9.4 mg/dL (ref 8.9–10.3)
Chloride: 108 mmol/L (ref 101–111)
Creatinine, Ser: 0.62 mg/dL (ref 0.44–1.00)
GFR calc Af Amer: >60 mL/min (ref >60)
GFR calc non Af Amer: >60 mL/min (ref >60)
Glucose, Bld: 136 mg/dL — ABNORMAL HIGH (ref 65–99)
Potassium: 3.5 mmol/L (ref 3.5–5.1)
Sodium: 139 mmol/L (ref 135–145)
Total Bilirubin: 0.5 mg/dL (ref 0.3–1.2)
Total Protein: 6 g/dL — ABNORMAL LOW (ref 6.5–8.1)

## 2015-11-17 LAB — CBC
HCT: 41.5 % (ref 36.0–46.0)
Hemoglobin: 13.6 g/dL (ref 12.0–15.0)
MCH: 29.5 pg (ref 26.0–34.0)
MCHC: 32.8 g/dL (ref 30.0–36.0)
MCV: 90 fL (ref 78.0–100.0)
Platelets: 187 10*3/uL (ref 150–400)
RBC: 4.61 MIL/uL (ref 3.87–5.11)
RDW: 13.2 % (ref 11.5–15.5)
WBC: 7.7 10*3/uL (ref 4.0–10.5)

## 2015-11-17 LAB — LIPASE, BLOOD: Lipase: 29 U/L (ref 11–51)

## 2015-11-17 MED ORDER — ONDANSETRON HCL 4 MG/2ML IJ SOLN
4.0000 mg | Freq: Once | INTRAMUSCULAR | Status: AC
  Administered 2015-11-17: 4 mg via INTRAVENOUS
  Filled 2015-11-17: qty 2

## 2015-11-17 MED ORDER — ONDANSETRON 8 MG PO TBDP: ORAL_TABLET | ORAL | Status: DC

## 2015-11-17 MED ORDER — METRONIDAZOLE 500 MG PO TABS: ORAL_TABLET | ORAL | Status: DC

## 2015-11-17 MED ORDER — MORPHINE SULFATE (PF) 4 MG/ML IV SOLN
4.0000 mg | Freq: Once | INTRAVENOUS | Status: AC
  Administered 2015-11-17: 4 mg via INTRAVENOUS
  Filled 2015-11-17: qty 1

## 2015-11-17 MED ORDER — MORPHINE SULFATE (PF) 2 MG/ML IV SOLN
2.0000 mg | Freq: Once | INTRAVENOUS | Status: AC
  Administered 2015-11-17: 2 mg via INTRAVENOUS
  Filled 2015-11-17: qty 1

## 2015-11-17 MED ORDER — IOPAMIDOL (ISOVUE-300) INJECTION 61%
INTRAVENOUS | Status: AC
  Administered 2015-11-17: 100 mL
  Filled 2015-11-17: qty 100

## 2015-11-17 MED ORDER — SODIUM CHLORIDE 0.9 % IV BOLUS (SEPSIS)
1000.0000 mL | Freq: Once | INTRAVENOUS | Status: AC
  Administered 2015-11-17: 1000 mL via INTRAVENOUS

## 2015-11-17 MED ORDER — HYDROCODONE-ACETAMINOPHEN 5-325 MG PO TABS: 1.0000 | ORAL_TABLET | Freq: Four times a day (QID) | ORAL | Status: DC | PRN

## 2015-11-17 NOTE — ED Notes (Signed)
Patient transported to CT 

## 2015-11-17 NOTE — ED Notes (Signed)
Pt returned from CT, requesting pain medication, MD aware

## 2015-11-17 NOTE — Discharge Instructions (Signed)
Flagyl as prescribed.  Hydrocodone as prescribed as needed for pain. Zofran as prescribed as needed for nausea.  Follow-up with your gastroenterologist if not improving in the next week.   Abdominal Pain, Adult Many things can cause abdominal pain. Usually, abdominal pain is not caused by a disease and will improve without treatment. It can often be observed and treated at home. Your health care provider will do a physical exam and possibly order blood tests and X-rays to help determine the seriousness of your pain. However, in many cases, more time must pass before a clear cause of the pain can be found. Before that point, your health care provider may not know if you need more testing or further treatment. HOME CARE INSTRUCTIONS Monitor your abdominal pain for any changes. The following actions may help to alleviate any discomfort you are experiencing:  Only take over-the-counter or prescription medicines as directed by your health care provider.  Do not take laxatives unless directed to do so by your health care provider.  Try a clear liquid diet (broth, tea, or water) as directed by your health care provider. Slowly move to a bland diet as tolerated. SEEK MEDICAL CARE IF:  You have unexplained abdominal pain.  You have abdominal pain associated with nausea or diarrhea.  You have pain when you urinate or have a bowel movement.  You experience abdominal pain that wakes you in the night.  You have abdominal pain that is worsened or improved by eating food.  You have abdominal pain that is worsened with eating fatty foods.  You have a fever. SEEK IMMEDIATE MEDICAL CARE IF:  Your pain does not go away within 2 hours.  You keep throwing up (vomiting).  Your pain is felt only in portions of the abdomen, such as the right side or the left lower portion of the abdomen.  You pass bloody or black tarry stools. MAKE SURE YOU:  Understand these instructions.  Will watch your  condition.  Will get help right away if you are not doing well or get worse.   This information is not intended to replace advice given to you by your health care provider. Make sure you discuss any questions you have with your health care provider.   Document Released: 02/27/2005 Document Revised: 02/08/2015 Document Reviewed: 01/27/2013 Elsevier Interactive Patient Education [employer].

## 2015-11-17 NOTE — Telephone Encounter (Signed)
01/30/16 with Dr Ardis Hughs to follow up, she will follow the recommendations given to her in the ED.  She will call if her symptoms worsen.  She was also out on the wait list for a sooner appt.

## 2015-11-21 DIAGNOSIS — R070 Pain in throat: Secondary | ICD-10-CM | POA: Diagnosis not present

## 2015-11-21 DIAGNOSIS — Z205 Contact with and (suspected) exposure to viral hepatitis: Secondary | ICD-10-CM | POA: Diagnosis not present

## 2015-11-21 DIAGNOSIS — R109 Unspecified abdominal pain: Secondary | ICD-10-CM | POA: Diagnosis not present

## 2015-11-29 DIAGNOSIS — B370 Candidal stomatitis: Secondary | ICD-10-CM | POA: Diagnosis not present

## 2015-12-01 ENCOUNTER — Other Ambulatory Visit: Payer: Self-pay

## 2015-12-04 ENCOUNTER — Other Ambulatory Visit: Payer: Self-pay | Admitting: Cardiology

## 2015-12-04 NOTE — Telephone Encounter (Signed)
REFILL 

## 2015-12-06 ENCOUNTER — Other Ambulatory Visit: Payer: Self-pay

## 2015-12-06 ENCOUNTER — Telehealth: Payer: Self-pay

## 2015-12-06 MED ORDER — EZETIMIBE 10 MG PO TABS: 10.0000 mg | ORAL_TABLET | Freq: Every day | ORAL | Status: DC

## 2015-12-06 NOTE — Telephone Encounter (Signed)
Ok to fill pt's zetia, sent to [facility]

## 2015-12-08 IMAGING — CT CT ABD-PELV W/ CM
2 of 5 series · 16 of 46 positions shown, 18 images · IV contrast (APPLIED)
Comparison: none

[Series 2: abd/ pelvis 5.0 i30f 1 · axial · 0.84mm/px · z∈[-449,-24]mm · 13 of 95 slices shown, 15 images]
[im 5/95  soft-tissue]
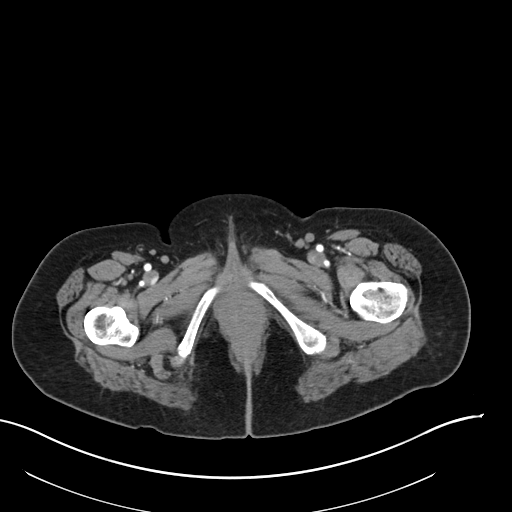
[im 5/95  bone]
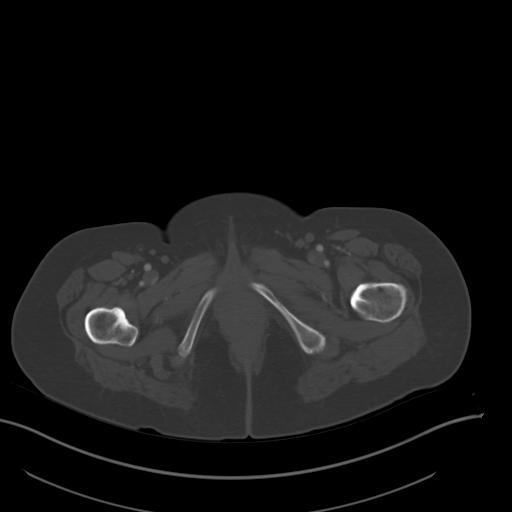
[im 15/95  soft-tissue]
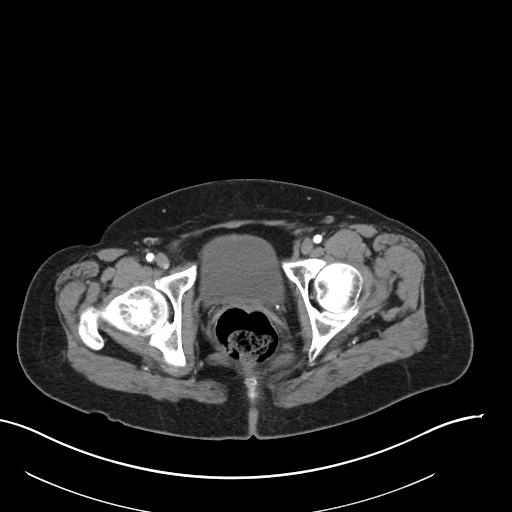
[im 19/95  soft-tissue]
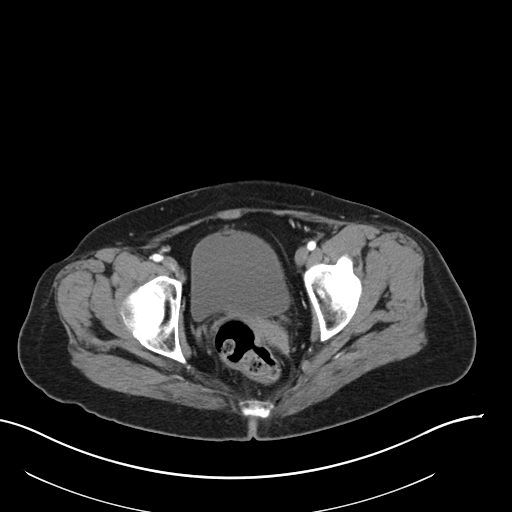
[im 29/95  soft-tissue]
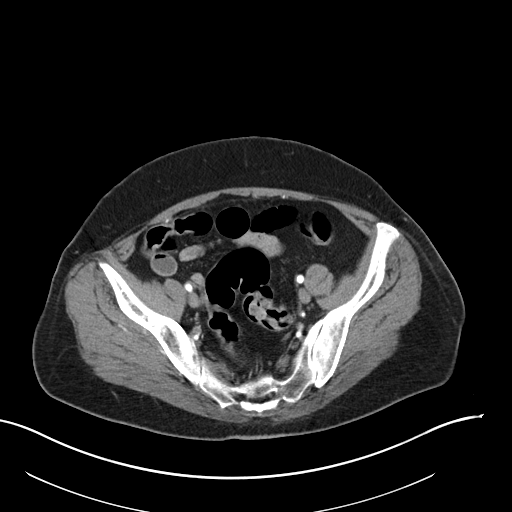
[im 33/95  soft-tissue]
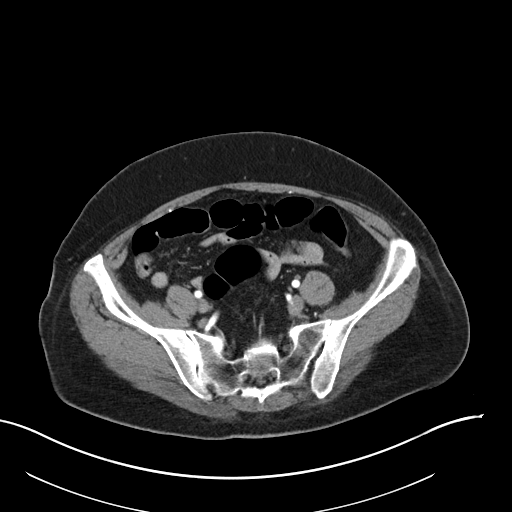
[im 43/95  soft-tissue]
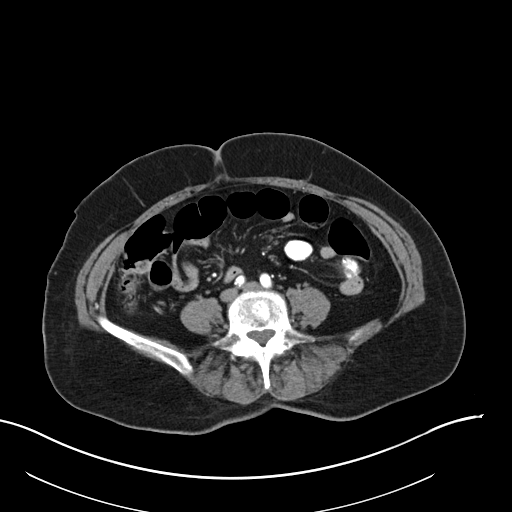
[im 48/95  soft-tissue]
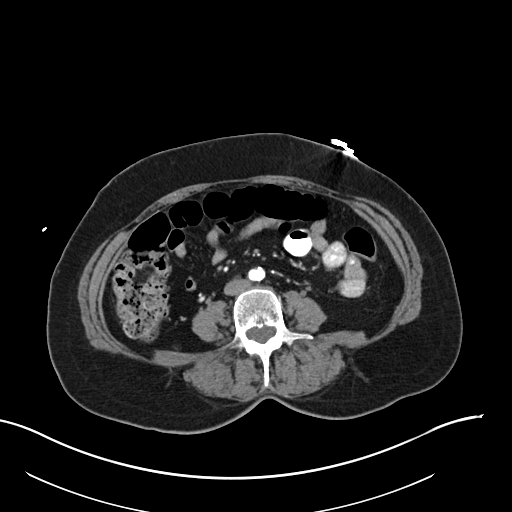
[im 52/95  soft-tissue]
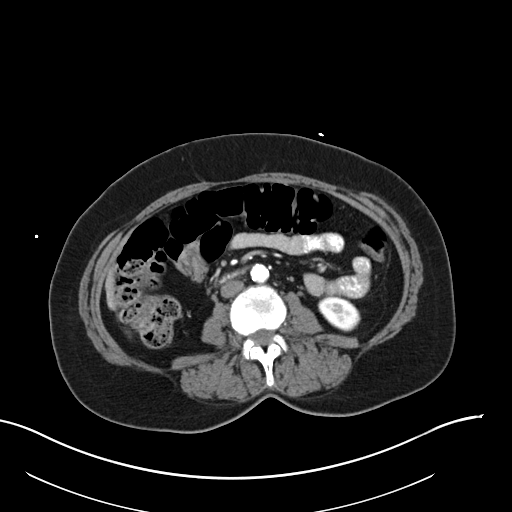
[im 62/95  soft-tissue]
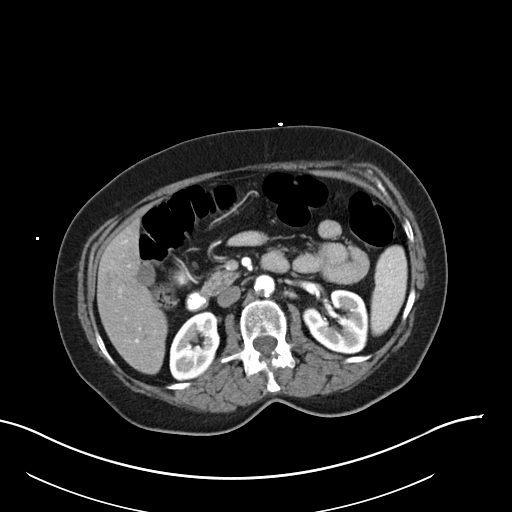
[im 62/95  bone]
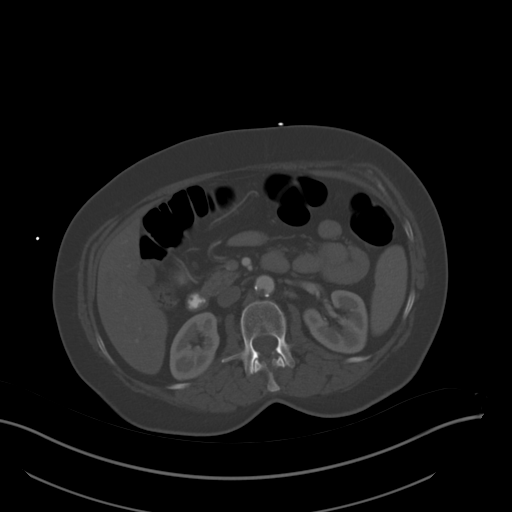
[im 66/95  soft-tissue]
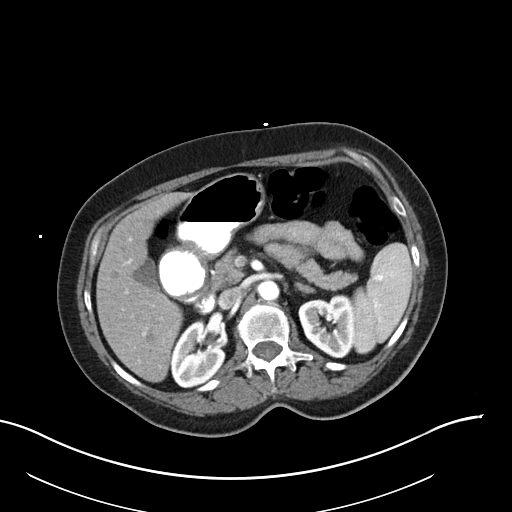
[im 76/95  soft-tissue]
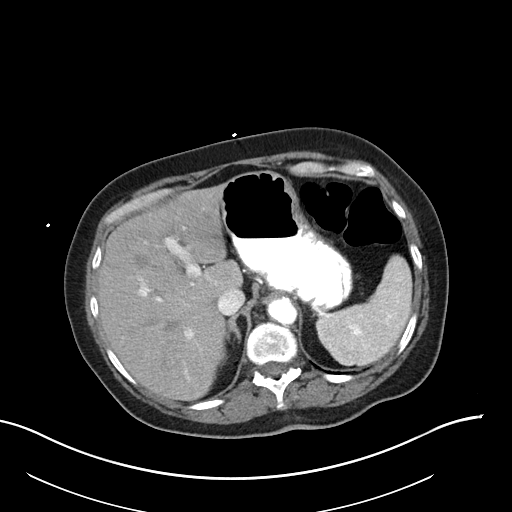
[im 80/95  soft-tissue]
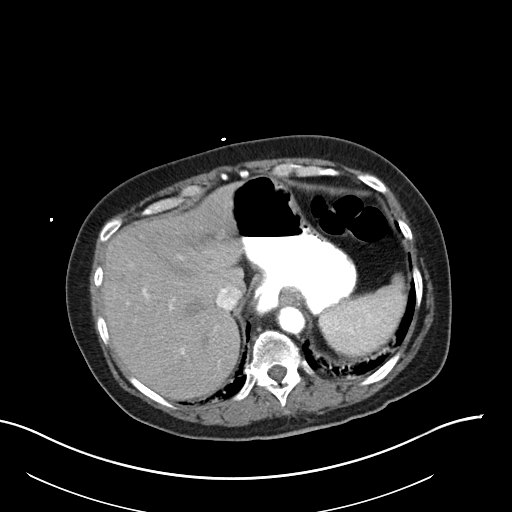
[im 90/95  soft-tissue]
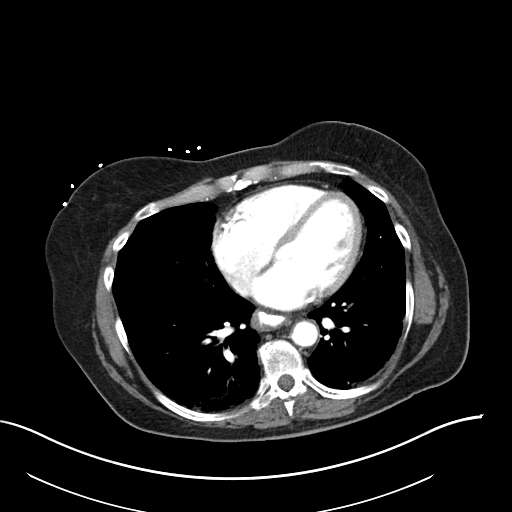

[Series 4: coronal soft tissue · coronal · 0.93mm/px · 3 of 125 slices shown]
[im 42/125  soft-tissue]
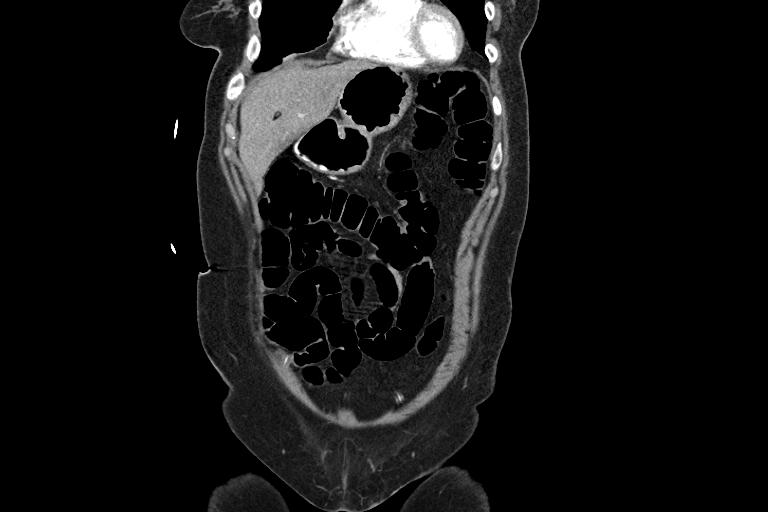
[im 56/125  soft-tissue]
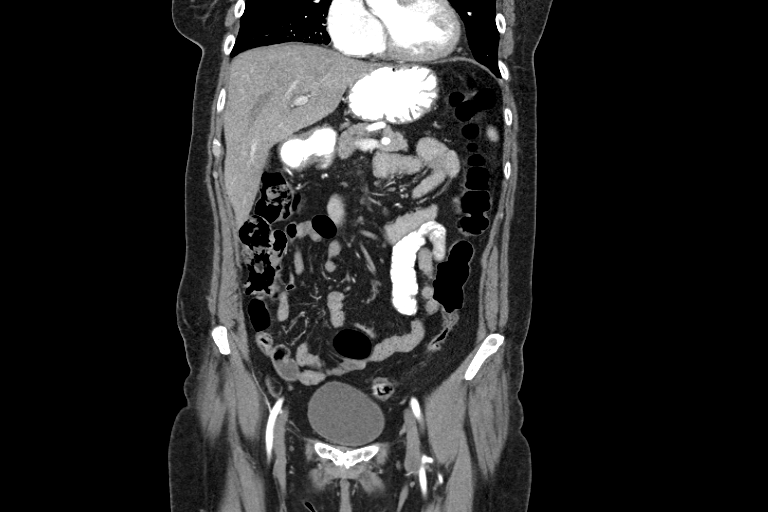
[im 69/125  soft-tissue]
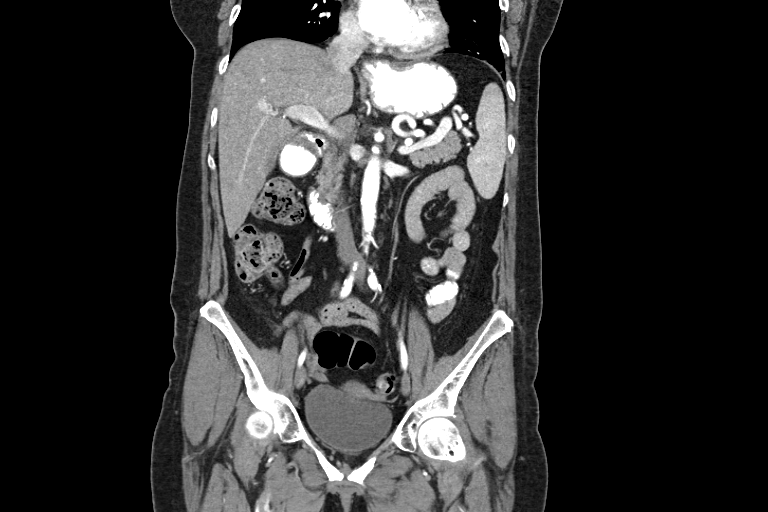

[16 of 46 positions shown; findings below may reference images not displayed]

CLINICAL DATA
Diffuse abdomen pain

EXAM
CT ABDOMEN AND PELVIS WITH CONTRAST

TECHNIQUE
Multidetector CT imaging of the abdomen and pelvis was performed
using the standard protocol following bolus administration of
intravenous contrast.

CONTRAST
100mL OMNIPAQUE IOHEXOL 300 MG/ML  SOLN

COMPARISON
June 07, 2013

FINDINGS
There is diffuse fatty infiltration of liver. There is 3 mm focal
high density in the anterior liver near the falciform ligament
unchanged compared to prior exam. The spleen, pancreas, gallbladder,
adrenal glands are normal. There is a 1 cm simple cyst in the
midpole right kidney unchanged. There is no hydronephrosis
bilaterally. There is atherosclerosis of the abdominal aorta without
aneurysmal dilatation. There is no abdominal lymphadenopathy. There
is no small bowel obstruction or diverticulitis. The appendix is
normal. There is a moderate hiatal hernia.

Fluid-filled bladder is normal. The uterus is normal. Pelvic
phleboliths are noted. There is atelectasis of the posterior lung
bases unchanged. There are emphysematous changes of the lungs.
Degenerative joint changes of the spine are identified.

IMPRESSION
No acute abnormality identified in the abdomen and pelvis.

SIGNATURE

## 2015-12-11 ENCOUNTER — Telehealth: Payer: Self-pay | Admitting: Internal Medicine

## 2015-12-11 NOTE — Telephone Encounter (Signed)
[facility]  I received a call from the patient as she was confused that when she picked up her Carvedilol prescription today, it was for 6.25 mg BID instead of 3.125 mg BID, which she stated she has been taking.  I advised that per her most recent office visit with Dr. [name] on 11/07/15, it appeared that she was supposed to be taking the 6.25 mg BID.  I advised her to break the 6.25 mg in half for tonight & tomorrow morning until she receives further clarification from Dr. Allison Quarry office (currently being covered by Dr. Debara Pickett).  I also advised that she was welcome to call back during business hours to speak with one of the office staff.    Frann Rider, MD

## 2015-12-12 ENCOUNTER — Telehealth: Payer: Self-pay | Admitting: Cardiology

## 2015-12-12 MED ORDER — CARVEDILOL 6.25 MG PO TABS: 6.2500 mg | ORAL_TABLET | Freq: Two times a day (BID) | ORAL | Status: DC

## 2015-12-12 NOTE — Telephone Encounter (Signed)
Please advise the patient to take the 6.25 mg BID dose as per Dr. Allison Quarry instructions.  Dr. Debara Pickett

## 2015-12-12 NOTE — Addendum Note (Signed)
Addended by: Earvin Hansen on: 12/12/2015 06:43 PM   Modules accepted: Orders

## 2015-12-12 NOTE — Telephone Encounter (Signed)
Spoke with pt, aware of dr hility recommendations.

## 2015-12-12 NOTE — Telephone Encounter (Signed)
Spoke with pt, she is aware to take the 6.25 mg dose. She was reluctant to increase but explained we need to get better control of her heart rate with the increased dose. She is willing to increase the dose and will call if she has any concerns.

## 2015-12-12 NOTE — Telephone Encounter (Signed)
New message     Pt c/o medication issue:  1. Name of Medication: carvedilol 2. How are you currently taking this medication (dosage and times per day)?  3.125 bid 3. Are you having a reaction (difficulty breathing--STAT)? no 4. What is your medication issue? Pt picked up a new presc of carvedilol and the dosage is 6.25 bid.  Why is she taking more medication?

## 2015-12-12 NOTE — Telephone Encounter (Signed)
It appears that you both spoke with Michele [name], however, the notes indicate different doses of carvedilol. I reviewed her monitor this morning and she has been having PAC's and tachycardia. The higher dose of 6.25 mg BID of carvedilol may help with this. I encourage her to try it.  Dr. Debara Pickett

## 2015-12-12 NOTE — Telephone Encounter (Signed)
Spoke with patient and she thought she had just been taking the Carvedilol 3.125 mg twice a day all along Reviewed chart and patient was to increase her dose to 6.25 mg twice a day in February after office visit  Spoke with patients pharmacy and she did pick up Carvedilol 6.25 mg tablets in Feb and May (90 day supply) and the 3.125 mg tables in Feb, April, and July Patient stated she did not think she had been taking both strengths but thinks she got her pills mixed up and may be in the wrong bottles now.   Advised patient to take her medications to her pharmacy and let them sort out to assure right strength in bottle Did try to help her with this via phone but has a difficult time seeing the letters/numbers on the pills Patient would prefer to be on the lower dose of Carvedilol 3.125 mg twice a day She has no way of checking her blood pressure/heart rate at home Advised to resume the Carvedilol 3.125 mg after the pharmacy sorts the medications out and to call back if any palpitations or problems Verbalized understanding.

## 2015-12-13 ENCOUNTER — Telehealth: Payer: Self-pay | Admitting: Cardiology

## 2015-12-13 NOTE — Telephone Encounter (Signed)
Returned call to patient.  She is concerned about her BP readings. Today they were 166/82, 151/98, 163/105, and 144/82.  Her HR is in the 50's when she is lying or resting then increases to the 70's when she is up and moving around. She is also concerned her blood sugar was 168 from her mother's old meter. Advised pt to follow up with her PCP about that. She continued on about her blood sugar and again I said to f/u with her PCP. Patient said she is still having palpitations. She feels them more when she's laying down and sometimes they last for hours. She also has palpitations occasionally throughout the day. She also complains of no energy. I encouraged her to continue with the plan of the increased dose of carvedilol and to keep a BP log to see if there is a trend of elevated BP. To call us if she has any other symptoms or problems and to report BP readings if needed.  Will forward to Dr. [name] to review.

## 2015-12-13 NOTE — Telephone Encounter (Signed)
Patient c/o Palpitations:  High priority if patient c/o lightheadedness and shortness of breath.  1. How long have you been having palpitations? 7/12  2. Are you currently experiencing lightheadedness and shortness of breath? no  3. Have you checked your BP and heart rate? (document readings)  166/82  Hr 50/74 4. Are you experiencing any other symptoms? 168 blood sugar

## 2015-12-15 NOTE — Telephone Encounter (Signed)
I am OK with the BP where it is for now - too hard to adjust her medications b/c all of her Sx.  Palpitations were not noted to be anything concerning on monitor.  Prefer not to increase BB dose.  PCP needs to monitor glucose level.   Glenetta Hew, MD

## 2015-12-15 NOTE — Telephone Encounter (Signed)
Called patient back and gave her Dr Allison Quarry recommendations. She verbalized understanding.

## 2015-12-19 DIAGNOSIS — E119 Type 2 diabetes mellitus without complications: Secondary | ICD-10-CM | POA: Diagnosis not present

## 2015-12-19 DIAGNOSIS — R634 Abnormal weight loss: Secondary | ICD-10-CM | POA: Diagnosis not present

## 2015-12-19 DIAGNOSIS — R109 Unspecified abdominal pain: Secondary | ICD-10-CM | POA: Diagnosis not present

## 2015-12-26 DIAGNOSIS — R070 Pain in throat: Secondary | ICD-10-CM | POA: Diagnosis not present

## 2015-12-26 DIAGNOSIS — J387 Other diseases of larynx: Secondary | ICD-10-CM | POA: Diagnosis not present

## 2015-12-26 DIAGNOSIS — F17210 Nicotine dependence, cigarettes, uncomplicated: Secondary | ICD-10-CM | POA: Diagnosis not present

## 2015-12-26 DIAGNOSIS — R1310 Dysphagia, unspecified: Secondary | ICD-10-CM | POA: Diagnosis not present

## 2015-12-27 ENCOUNTER — Telehealth: Payer: Self-pay | Admitting: Gastroenterology

## 2015-12-27 ENCOUNTER — Other Ambulatory Visit: Payer: Self-pay | Admitting: Family Medicine

## 2015-12-27 DIAGNOSIS — R51 Headache: Principal | ICD-10-CM

## 2015-12-27 DIAGNOSIS — R519 Headache, unspecified: Secondary | ICD-10-CM

## 2015-12-27 NOTE — Telephone Encounter (Signed)
The pt would like a call back from Dr Ardis Hughs to discuss a cyst that was found in her throat.  She wants to know why Dr Ardis Hughs did not see the cyst during her recent EGD.  The pt is having the ENT fax her records here for review.

## 2015-12-27 NOTE — Telephone Encounter (Signed)
We discussed her EGD findings.

## 2015-12-31 ENCOUNTER — Ambulatory Visit
Admission: RE | Admit: 2015-12-31 | Discharge: 2015-12-31 | Disposition: A | Discharge status: Home / Self Care | Payer: Medicare Other | Source: Ambulatory Visit | Attending: Family Medicine | Admitting: Family Medicine

## 2015-12-31 DIAGNOSIS — R519 Headache, unspecified: Secondary | ICD-10-CM

## 2015-12-31 DIAGNOSIS — R51 Headache: Principal | ICD-10-CM

## 2016-01-01 ENCOUNTER — Telehealth: Payer: Self-pay | Admitting: Cardiology

## 2016-01-01 ENCOUNTER — Other Ambulatory Visit: Payer: Medicare Other

## 2016-01-01 NOTE — Telephone Encounter (Signed)
New Message  Pt call requesting to speak with RN about her going to an ear nose and throat doctor. Pt states she has some questions for RN and would like a call back. pllease call back to discuss

## 2016-01-01 NOTE — Telephone Encounter (Signed)
Returned call to patient.  Pt reports she went to ENT doctor due to having a sore throat and the MD thinks she may have throat cancer-wanting to do surgery.  Patient is going to another ENT doctor for 2nd opinion prior to making decision.  Patient concerned regarding cardiac clearance.  Advised patient once she makes a decision regarding plan of care, MD office would request surgical clearance and MD [name] would make a decision at that time.  Patient requesting to let MD [name] aware of situation.  Will route to MD to make aware.  Advised to call with further questions or concerns.

## 2016-01-02 NOTE — Telephone Encounter (Signed)
My inclination would be to let her proceed with her surgery if she needs it.  Let us know when they plan to do something, and we can do her preoperative evaluation. Probably don't need to see her him a since I have seen her recently.  Frannie

## 2016-01-05 NOTE — Telephone Encounter (Signed)
This note was faxed to dr rosen @ [phone].

## 2016-01-05 NOTE — Telephone Encounter (Signed)
Ms Daus has been relatively asymptomatic from a cardiology standpoint. She will be low risk for low risk procedure. Okay to hold Plavix.   Glenetta Hew, M.D., M.S. Interventional Cardiologist   Pager # [phone] Phone # [phone] [address] [place], [place] [address]

## 2016-01-06 ENCOUNTER — Other Ambulatory Visit: Payer: Medicare Other

## 2016-01-08 ENCOUNTER — Telehealth: Payer: Self-pay | Admitting: Cardiology

## 2016-01-08 NOTE — Telephone Encounter (Signed)
Pt is on Citalopram 20 mg. Pt have lost so much weight and having bad headaches.She wants to stop them,she wants to know how to come off of them safely.

## 2016-01-08 NOTE — Telephone Encounter (Signed)
Pt stated that she has been taking Celexa 10 mg (half of '20mg'$  tablet0 daily for the the last few months. Pt states she feels horrible, she has constant headaches, has lost about 20 lbs, and she has recently started having tremors.  Reviewed with PharmD, pt to decrease dose by half, '5mg'$  by mouth, for the next 7 days then take 5 mg by mouth every other day for a week and stop. Pt to contact PCP is she wants to try a different medication for her depression.  Pt verbalized understanding of new medication instructions and medication removed from her medication list.

## 2016-01-09 NOTE — Telephone Encounter (Signed)
New message    Patient calling has 2-3 questions for nurse regarding upcoming procedure.

## 2016-01-10 ENCOUNTER — Other Ambulatory Visit: Payer: Self-pay | Admitting: Otolaryngology

## 2016-01-10 DIAGNOSIS — D3705 Neoplasm of uncertain behavior of pharynx: Secondary | ICD-10-CM | POA: Diagnosis not present

## 2016-01-11 NOTE — Telephone Encounter (Signed)
Returned patient call:  Patient wants to know if she should stop taking her Coenzyme Q10 for her throat surgery scheduled on 8/16.  Advised that I would verify with our pharmacist and return her call to update her.

## 2016-01-11 NOTE — Telephone Encounter (Signed)
I know of no reason to stop CoQ10, but her best bet is to verify with surgeon's office

## 2016-01-11 NOTE — Telephone Encounter (Signed)
Follow up       Pt is having throat surgery.  The surgeon does not need clearance but the patient want to know if she should stop her coq10?  She has a mass on her throat and is not expecting to stay overnight.  Please call

## 2016-01-11 NOTE — Telephone Encounter (Signed)
Attempt to return call to advise of pharmacist recommendations-no answer.  Left message (ok per DPR) with recommendations.    Advised to call with further questions/concerns.

## 2016-01-12 ENCOUNTER — Inpatient Hospital Stay (HOSPITAL_COMMUNITY): Admission: RE | Admit: 2016-01-12 | Payer: Medicare Other | Source: Ambulatory Visit

## 2016-01-18 DIAGNOSIS — M545 Low back pain: Secondary | ICD-10-CM | POA: Diagnosis not present

## 2016-01-18 DIAGNOSIS — R350 Frequency of micturition: Secondary | ICD-10-CM | POA: Diagnosis not present

## 2016-01-30 ENCOUNTER — Encounter: Payer: Self-pay | Admitting: Nurse Practitioner

## 2016-01-30 ENCOUNTER — Ambulatory Visit (INDEPENDENT_AMBULATORY_CARE_PROVIDER_SITE_OTHER): Payer: Medicare Other | Admitting: Nurse Practitioner

## 2016-01-30 ENCOUNTER — Ambulatory Visit: Payer: Medicare Other | Admitting: Gastroenterology

## 2016-01-30 VITALS — BP 108/70 | HR 81 | Ht 65.0 in | Wt 131.6 lb

## 2016-01-30 DIAGNOSIS — E785 Hyperlipidemia, unspecified: Secondary | ICD-10-CM

## 2016-01-30 DIAGNOSIS — Z720 Tobacco use: Secondary | ICD-10-CM

## 2016-01-30 DIAGNOSIS — I208 Other forms of angina pectoris: Secondary | ICD-10-CM

## 2016-01-30 DIAGNOSIS — R002 Palpitations: Secondary | ICD-10-CM

## 2016-01-30 DIAGNOSIS — I119 Hypertensive heart disease without heart failure: Secondary | ICD-10-CM | POA: Diagnosis not present

## 2016-01-30 DIAGNOSIS — I25119 Atherosclerotic heart disease of native coronary artery with unspecified angina pectoris: Secondary | ICD-10-CM

## 2016-01-30 DIAGNOSIS — R079 Chest pain, unspecified: Secondary | ICD-10-CM | POA: Diagnosis not present

## 2016-01-30 NOTE — Patient Instructions (Signed)
Medication Instructions:  Your physician recommends that you continue on your current medications as directed. Please refer to the Current Medication list given to you today.  Labwork: None ordered  Testing/Procedures: Your physician has requested that you have en exercise stress myoview. For further information please visit [email or web address]. Please follow instruction sheet, as given.  Follow-Up: Your physician recommends that you schedule a follow-up appointment in: 3 MONTHS with DR [facility]  Any Other Special Instructions Will Be Listed Below (If Applicable).     If you need a refill on your cardiac medications before your next appointment, please call your pharmacy.
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
## 2016-01-31 ENCOUNTER — Telehealth (HOSPITAL_COMMUNITY): Payer: Self-pay

## 2016-01-31 NOTE — Telephone Encounter (Signed)
Encounter complete. 

## 2016-02-02 ENCOUNTER — Ambulatory Visit (HOSPITAL_COMMUNITY)
Admission: RE | Admit: 2016-02-02 | Discharge: 2016-02-02 | Disposition: A | Discharge status: Home / Self Care | Payer: Medicare Other | Source: Ambulatory Visit | Attending: Cardiovascular Disease | Admitting: Cardiovascular Disease

## 2016-02-02 ENCOUNTER — Telehealth: Payer: Self-pay | Admitting: Nurse Practitioner

## 2016-02-02 DIAGNOSIS — J449 Chronic obstructive pulmonary disease, unspecified: Secondary | ICD-10-CM | POA: Diagnosis not present

## 2016-02-02 DIAGNOSIS — R42 Dizziness and giddiness: Secondary | ICD-10-CM | POA: Diagnosis not present

## 2016-02-02 DIAGNOSIS — R002 Palpitations: Secondary | ICD-10-CM | POA: Diagnosis not present

## 2016-02-02 DIAGNOSIS — R079 Chest pain, unspecified: Secondary | ICD-10-CM

## 2016-02-02 DIAGNOSIS — R0609 Other forms of dyspnea: Secondary | ICD-10-CM | POA: Diagnosis not present

## 2016-02-02 DIAGNOSIS — R5383 Other fatigue: Secondary | ICD-10-CM | POA: Insufficient documentation

## 2016-02-02 DIAGNOSIS — I2510 Atherosclerotic heart disease of native coronary artery without angina pectoris: Secondary | ICD-10-CM | POA: Diagnosis not present

## 2016-02-02 DIAGNOSIS — I10 Essential (primary) hypertension: Secondary | ICD-10-CM | POA: Insufficient documentation

## 2016-02-02 DIAGNOSIS — Z720 Tobacco use: Secondary | ICD-10-CM | POA: Insufficient documentation

## 2016-02-02 HISTORY — PX: NM MYOVIEW LTD: HXRAD82

## 2016-02-02 LAB — MYOCARDIAL PERFUSION IMAGING
LV dias vol: 80 mL (ref 46–106)
LV sys vol: 40 mL
Peak HR: 110 {beats}/min
Rest HR: 68 {beats}/min
SDS: 4
SRS: 2
SSS: 6
TID: 1.06

## 2016-02-02 MED ORDER — TECHNETIUM TC 99M TETROFOSMIN IV KIT
9.8000 | PACK | Freq: Once | INTRAVENOUS | Status: AC | PRN
  Administered 2016-02-02: 10 via INTRAVENOUS
  Filled 2016-02-02: qty 10

## 2016-02-02 MED ORDER — AMINOPHYLLINE 25 MG/ML IV SOLN
75.0000 mg | Freq: Once | INTRAVENOUS | Status: AC
  Administered 2016-02-02: 75 mg via INTRAVENOUS

## 2016-02-02 MED ORDER — REGADENOSON 0.4 MG/5ML IV SOLN
0.4000 mg | Freq: Once | INTRAVENOUS | Status: AC
  Administered 2016-02-02: 0.4 mg via INTRAVENOUS

## 2016-02-02 MED ORDER — TECHNETIUM TC 99M TETROFOSMIN IV KIT
28.8000 | PACK | Freq: Once | INTRAVENOUS | Status: AC | PRN
  Administered 2016-02-02: 28.8 via INTRAVENOUS
  Filled 2016-02-02: qty 29

## 2016-02-02 NOTE — Telephone Encounter (Signed)
Follow up   Returning call from somebody concerning her stress test results today.

## 2016-02-02 NOTE — Telephone Encounter (Signed)
Returning call from somebody concerning her stress test results today.

## 2016-02-02 NOTE — Telephone Encounter (Signed)
Returned call to patient-made aware of stress test results:  Notes Recorded by Rogelia Mire, NP on 02/02/2016 at 3:08 PM EDT Normal stress test. No further eval for c/p required at this time.  Pt verbalized understanding.  No further questions at this time.

## 2016-02-07 ENCOUNTER — Ambulatory Visit (HOSPITAL_COMMUNITY)
Admission: RE | Admit: 2016-02-07 | Discharge: 2016-02-07 | Disposition: A | Discharge status: Home / Self Care | Payer: Medicare Other | Source: Ambulatory Visit | Attending: Otolaryngology | Admitting: Otolaryngology

## 2016-02-07 ENCOUNTER — Encounter (HOSPITAL_COMMUNITY)
Admission: RE | Admit: 2016-02-07 | Discharge: 2016-02-07 | Disposition: A | Discharge status: Home / Self Care | Payer: Medicare Other | Source: Ambulatory Visit | Attending: Otolaryngology | Admitting: Otolaryngology

## 2016-02-07 ENCOUNTER — Telehealth: Payer: Self-pay | Admitting: Cardiology

## 2016-02-07 ENCOUNTER — Encounter (HOSPITAL_COMMUNITY): Payer: Self-pay | Admitting: Vascular Surgery

## 2016-02-07 ENCOUNTER — Encounter (HOSPITAL_COMMUNITY): Payer: Self-pay

## 2016-02-07 DIAGNOSIS — R0600 Dyspnea, unspecified: Secondary | ICD-10-CM | POA: Insufficient documentation

## 2016-02-07 DIAGNOSIS — I712 Thoracic aortic aneurysm, without rupture: Secondary | ICD-10-CM | POA: Insufficient documentation

## 2016-02-07 DIAGNOSIS — J449 Chronic obstructive pulmonary disease, unspecified: Secondary | ICD-10-CM | POA: Diagnosis not present

## 2016-02-07 HISTORY — DX: Reserved for inherently not codable concepts without codable children: IMO0001

## 2016-02-07 HISTORY — DX: Cardiac arrhythmia, unspecified: I49.9

## 2016-02-07 LAB — COMPREHENSIVE METABOLIC PANEL
ALT: 9 U/L — ABNORMAL LOW (ref 14–54)
AST: 30 U/L (ref 15–41)
Albumin: 4 g/dL (ref 3.5–5.0)
Alkaline Phosphatase: 54 U/L (ref 38–126)
Anion gap: 5 (ref 5–15)
BUN: 7 mg/dL (ref 6–20)
CO2: 28 mmol/L (ref 22–32)
Calcium: 9.8 mg/dL (ref 8.9–10.3)
Chloride: 108 mmol/L (ref 101–111)
Creatinine, Ser: 0.65 mg/dL (ref 0.44–1.00)
GFR calc Af Amer: >60 mL/min (ref >60)
GFR calc non Af Amer: >60 mL/min (ref >60)
Glucose, Bld: 100 mg/dL — ABNORMAL HIGH (ref 65–99)
Potassium: 4.3 mmol/L (ref 3.5–5.1)
Sodium: 141 mmol/L (ref 135–145)
Total Bilirubin: 0.9 mg/dL (ref 0.3–1.2)
Total Protein: 6.6 g/dL (ref 6.5–8.1)

## 2016-02-07 LAB — PROTIME-INR
INR: 1.03
Prothrombin Time: 13.5 seconds (ref 11.4–15.2)

## 2016-02-07 LAB — CBC
HCT: 45.3 % (ref 36.0–46.0)
Hemoglobin: 14.6 g/dL (ref 12.0–15.0)
MCH: 30.2 pg (ref 26.0–34.0)
MCHC: 32.2 g/dL (ref 30.0–36.0)
MCV: 93.6 fL (ref 78.0–100.0)
Platelets: 165 10*3/uL (ref 150–400)
RBC: 4.84 MIL/uL (ref 3.87–5.11)
RDW: 13.5 % (ref 11.5–15.5)
WBC: 6.3 10*3/uL (ref 4.0–10.5)

## 2016-02-07 LAB — GLUCOSE, CAPILLARY: Glucose-Capillary: 146 mg/dL — ABNORMAL HIGH (ref 65–99)

## 2016-02-07 NOTE — Pre-Procedure Instructions (Signed)
Michele [name]  02/07/2016      [facility] [address] [place], [place] [address] Phone: [phone] Fax: [phone]    Your procedure is scheduled on Sept 13.  Report to [facility] Admitting at 630 A.M.  Call this number if you have problems the morning of surgery:  [phone]   Remember:  Do not eat food or drink liquids after midnight.  Take these medicines the morning of surgery with A SIP OF WATER    CARVEDILOL (COREG), CLONAZEPAM , PANTOPRAZOLE (PROTONIX), SPIRIVA    (STOP FISH OIL, VASCEPA, NO IBUPROFEN/ ADVIL/ MOTRIN, GOODY POWDERS, BC'S, HERBAL MEDICINES+ STOP PLAVIX AS INSTRUCTED)  Stop taking aspirin, BC's, Goody's, Herbal mediations, Vitamins, Ibuprofen, Advil, Motrin, Aleve   Do not wear jewelry, make-up or nail polish.  Do not wear lotions, powders, or perfumes, or deoderant.  Do not shave 48 hours prior to surgery.  Men may shave face and neck.  Do not bring valuables to the hospital.  [facility] is not responsible for any belongings or valuables.  Contacts, dentures or bridgework may not be worn into surgery.  Leave your suitcase in the car.  After surgery it may be brought to your room.  For patients admitted to the hospital, discharge time will be determined by your treatment team.  Patients discharged the day of surgery will not be allowed to drive home.    Special instructions:  [name] - Preparing for Surgery  Before surgery, you can play an important role.  Because skin is not sterile, your skin needs to be as free of germs as possible.  You can reduce the number of germs on you skin by washing with CHG (chlorahexidine gluconate) soap before surgery.  CHG is an antiseptic cleaner which kills germs and bonds with the skin to continue killing germs even after washing.  Please DO NOT use if you have an allergy to CHG or antibacterial  soaps.  If your skin becomes reddened/irritated stop using the CHG and inform your nurse when you arrive at Short Stay.  Do not shave (including legs and underarms) for at least 48 hours prior to the first CHG shower.  You may shave your face.  Please follow these instructions carefully:   1.  Shower with CHG Soap the night before surgery and the                                morning of Surgery.  2.  If you choose to wash your hair, wash your hair first as usual with your       normal shampoo.  3.  After you shampoo, rinse your hair and body thoroughly to remove the                      Shampoo.  4.  Use CHG as you would any other liquid soap.  You can apply chg directly       to the skin and wash gently with scrungie or a clean washcloth.  5.  Apply the CHG Soap to your body ONLY FROM THE NECK DOWN.        Do not use on open wounds or open sores.  Avoid contact with your eyes,       ears, mouth  and genitals (private parts).  Wash genitals (private parts)       with your normal soap.  6.  Wash thoroughly, paying special attention to the area where your surgery        will be performed.  7.  Thoroughly rinse your body with warm water from the neck down.  8.  DO NOT shower/wash with your normal soap after using and rinsing off       the CHG Soap.  9.  Pat yourself dry with a clean towel.            10.  Wear clean pajamas.            11.  Place clean sheets on your bed the night of your first shower and do not        sleep with pets.  Day of Surgery  Do not apply any lotions/deoderants the morning of surgery.  Please wear clean clothes to the hospital/surgery center.     Please read over the following fact sheets that you were given. Pain Booklet and Surgical Site Infection Prevention

## 2016-02-07 NOTE — Telephone Encounter (Signed)
Called Dr. Benjamine Mola office for fax number.  Clearance faxed to number provided: [phone].

## 2016-02-07 NOTE — Telephone Encounter (Signed)
Request for surgical clearance:  1. What type of surgery is being performed? laryngoscopy with biopsy   2. When is this surgery scheduled? 02-14-16  3. Are there any medications that need to be held prior to surgery and how long? Plavix and fish oil  4. Name of physician performing surgery? Dr. Prescott Parma  5. What is your office phone and fax number? [phone].

## 2016-02-07 NOTE — Telephone Encounter (Signed)
OK to hold both Plavix & Fish oil.  Glenetta Hew, MD

## 2016-02-07 NOTE — Telephone Encounter (Signed)
Follow up    Pt verbalized that she is calling to speak to the rn about medications being held

## 2016-02-07 NOTE — Telephone Encounter (Signed)
SPOKE TO PATIENT. CLARIFIED INSTRUCTION -MAY HOLD  7 DAYS PLAVIX -PRIOR TO SURGERY. PATIENT VERBALIZED UNDERSTANDING.[name]

## 2016-02-07 NOTE — Pre-Procedure Instructions (Signed)
Michele [name]  02/07/2016      [facility] [place], [address] [place] [address] Phone: [phone] Fax: [phone]    Your procedure is scheduled on Sept 13.  Report to [facility] Admitting at 630 A.M.  Call this number if you have problems the morning of surgery:  [phone]   Remember:  Do not eat food or drink liquids after midnight.  Take these medicines the morning of surgery with A SIP OF WATER    CARVEDILOL (COREG), CLONAZEPAM , PANTOPRAZOLE (PROTONIX), SPIRIVA    (STOP FISH OIL, VASCEPA, NO IBUPROFEN/ ADVIL/ MOTRIN, GOODY POWDERS, BC'S, HERBAL MEDICINES+ STOP PLAVIX AS INSTRUCTED)  Stop taking aspirin, BC's, Goody's, Herbal mediations, Vitamins, Ibuprofen, Advil, Motrin, Aleve   Do not wear jewelry, make-up or nail polish.  Do not wear lotions, powders, or perfumes, or deoderant.  Do not shave 48 hours prior to surgery.  Men may shave face and neck.  Do not bring valuables to the hospital.  [facility] is not responsible for any belongings or valuables.  Contacts, dentures or bridgework may not be worn into surgery.  Leave your suitcase in the car.  After surgery it may be brought to your room.  For patients admitted to the hospital, discharge time will be determined by your treatment team.  Patients discharged the day of surgery will not be allowed to drive home.    Special instructions:  [facility] - Preparing for Surgery  Before surgery, you can play an important role.  Because skin is not sterile, your skin needs to be as free of germs as possible.  You can reduce the number of germs on you skin by washing with CHG (chlorahexidine gluconate) soap before surgery.  CHG is an antiseptic cleaner which kills germs and bonds with the skin to continue killing germs even after washing.  Please DO NOT use if you have an allergy to CHG or antibacterial  soaps.  If your skin becomes reddened/irritated stop using the CHG and inform your nurse when you arrive at Short Stay.  Do not shave (including legs and underarms) for at least 48 hours prior to the first CHG shower.  You may shave your face.  Please follow these instructions carefully:   1.  Shower with CHG Soap the night before surgery and the                                morning of Surgery.  2.  If you choose to wash your hair, wash your hair first as usual with your       normal shampoo.  3.  After you shampoo, rinse your hair and body thoroughly to remove the                      Shampoo.  4.  Use CHG as you would any other liquid soap.  You can apply chg directly       to the skin and wash gently with scrungie or a clean washcloth.  5.  Apply the CHG Soap to your body ONLY FROM THE NECK DOWN.        Do not use on open wounds or open sores.  Avoid contact with your eyes,       ears, mouth  and genitals (private parts).  Wash genitals (private parts)       with your normal soap.  6.  Wash thoroughly, paying special attention to the area where your surgery        will be performed.  7.  Thoroughly rinse your body with warm water from the neck down.  8.  DO NOT shower/wash with your normal soap after using and rinsing off       the CHG Soap.  9.  Pat yourself dry with a clean towel.            10.  Wear clean pajamas.            11.  Place clean sheets on your bed the night of your first shower and do not        sleep with pets.  Day of Surgery  Do not apply any lotions/deoderants the morning of surgery.  Please wear clean clothes to the hospital/surgery center.     Please read over the following fact sheets that you were given. Pain Booklet and Surgical Site Infection Prevention

## 2016-02-07 NOTE — Telephone Encounter (Signed)
Clearance routed to MD.

## 2016-02-08 NOTE — Progress Notes (Signed)
Anesthesia Chart Review:  Pt is a 60 year old female scheduled for direct laryngoscopy and biopsy on 02/14/2016 with Leta Baptist, MD.   - PCP is [name], MD - Cardiologist is Glenetta Hew, MD; last office visit 01/30/16 with Murray Hodgkins, NP. PT has been cleared for surgery by Dr. [name].   PMH includes:  CAD (NSTEMI 2013, multiple stents and in-stent restenosis to CX-OM and RCA), HTN, seizures, asthma (as a child), emphysema, COPD, hepatitis, tuberculosis, hyperlipidemia, GERD.  Current smoker. BMI 21  Medications include: carvedilol, plavix, imdur, protonix, pravastatin, spiriva, vascepa. Pt to hold plavix 7 days before surgery.   Preoperative labs reviewed.    CXR 02/07/16: 1. 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 IV contrast-enhanced chest CT can be obtained if further evaluate. 2. Bullous COPD.  -- I have routed CXR results to Dr. Kenton Kingfisher and Dr. [name] for f/u purposes.   EKG 09/26/15: NSR. Baseline wander  Nuclear stress test 02/02/16:   The left ventricular ejection fraction is mildly decreased (45-54%).  Nuclear stress EF: 50%.  There was no ST segment deviation noted during stress.  The study is normal.  This is a low risk study. Normal resting and stress perfusion. No ischemia or infarction EF 50% but visually looks normal suggest echo or MRI correlation   Cardiac event monitor 12/12/15: Sinus rhythm, sinus bradycardia and PAC's. No a-fib.  Cardiac cath 03/02/14:  1. Single vessel obstructive CAD with restenosis of the distal LCx/OM3 bifurcation. 2. Normal LV function. - Recommendations: I would recommend intensive medical therapy. The distal LCx/OM bifurcation has been intervened on twice and she now has diffuse in stent restenosis. The vessel is small in caliber. Given these factors I think repeat PCI is unlikely to offer any long term benefit. Will review films with Dr. [name].  Echo 12/19/13:  - Left  ventricle: The cavity size was normal. Wall thickness was normal. Systolic function was vigorous. The estimated ejection fraction was in the range of 65% to 70%. Wall motion was normal; there were no regional wall motion abnormalities. There was an increased relative contribution of atrial contraction to ventricular filling.  If no changes, I anticipate pt can proceed with surgery as scheduled.   Willeen Cass, FNP-BC [facility]/Anesthesiology Phone: [phone] 02/08/2016 3:12 PM

## 2016-02-09 ENCOUNTER — Ambulatory Visit: Payer: Medicare Other | Admitting: Cardiology

## 2016-02-09 ENCOUNTER — Ambulatory Visit
Admission: RE | Admit: 2016-02-09 | Discharge: 2016-02-09 | Disposition: A | Discharge status: Home / Self Care | Payer: Medicare Other | Source: Ambulatory Visit | Attending: Otolaryngology | Admitting: Otolaryngology

## 2016-02-09 ENCOUNTER — Other Ambulatory Visit (INDEPENDENT_AMBULATORY_CARE_PROVIDER_SITE_OTHER): Payer: Self-pay | Admitting: Otolaryngology

## 2016-02-09 DIAGNOSIS — I719 Aortic aneurysm of unspecified site, without rupture: Secondary | ICD-10-CM

## 2016-02-09 DIAGNOSIS — J439 Emphysema, unspecified: Secondary | ICD-10-CM | POA: Diagnosis not present

## 2016-02-09 MED ORDER — IOPAMIDOL (ISOVUE-300) INJECTION 61%: 75.0000 mL | Freq: Once | INTRAVENOUS | Status: DC | PRN

## 2016-02-12 ENCOUNTER — Other Ambulatory Visit: Payer: Self-pay | Admitting: *Deleted

## 2016-02-12 ENCOUNTER — Institutional Professional Consult (permissible substitution) (INDEPENDENT_AMBULATORY_CARE_PROVIDER_SITE_OTHER): Payer: Medicare Other | Admitting: Cardiothoracic Surgery

## 2016-02-12 ENCOUNTER — Encounter: Payer: Self-pay | Admitting: Cardiothoracic Surgery

## 2016-02-12 ENCOUNTER — Other Ambulatory Visit: Payer: Self-pay | Admitting: Otolaryngology

## 2016-02-12 VITALS — BP 136/80 | HR 72 | Resp 20 | Ht 65.0 in | Wt 130.0 lb

## 2016-02-12 DIAGNOSIS — J9859 Other diseases of mediastinum, not elsewhere classified: Secondary | ICD-10-CM | POA: Diagnosis not present

## 2016-02-12 DIAGNOSIS — I208 Other forms of angina pectoris: Secondary | ICD-10-CM | POA: Diagnosis not present

## 2016-02-12 DIAGNOSIS — R590 Localized enlarged lymph nodes: Secondary | ICD-10-CM
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
## 2016-02-14 ENCOUNTER — Telehealth: Payer: Self-pay

## 2016-02-14 ENCOUNTER — Encounter (HOSPITAL_COMMUNITY): Payer: Self-pay | Admitting: Certified Registered"

## 2016-02-14 ENCOUNTER — Encounter (HOSPITAL_COMMUNITY)
Admission: RE | Disposition: A | Discharge status: Home / Self Care | Payer: Self-pay | Source: Ambulatory Visit | Attending: Otolaryngology

## 2016-02-14 ENCOUNTER — Telehealth: Payer: Self-pay | Admitting: *Deleted

## 2016-02-14 ENCOUNTER — Ambulatory Visit (HOSPITAL_COMMUNITY): Payer: Medicare Other | Admitting: Certified Registered"

## 2016-02-14 ENCOUNTER — Ambulatory Visit (HOSPITAL_COMMUNITY): Payer: Medicare Other

## 2016-02-14 ENCOUNTER — Ambulatory Visit (HOSPITAL_COMMUNITY)
Admission: RE | Admit: 2016-02-14 | Discharge: 2016-02-14 | Disposition: A | Discharge status: Home / Self Care | Payer: Medicare Other | Source: Ambulatory Visit | Attending: Otolaryngology | Admitting: Otolaryngology

## 2016-02-14 ENCOUNTER — Other Ambulatory Visit: Payer: Self-pay

## 2016-02-14 ENCOUNTER — Other Ambulatory Visit: Payer: Medicare Other

## 2016-02-14 DIAGNOSIS — M1990 Unspecified osteoarthritis, unspecified site: Secondary | ICD-10-CM | POA: Diagnosis not present

## 2016-02-14 DIAGNOSIS — I252 Old myocardial infarction: Secondary | ICD-10-CM | POA: Insufficient documentation

## 2016-02-14 DIAGNOSIS — E785 Hyperlipidemia, unspecified: Secondary | ICD-10-CM | POA: Insufficient documentation

## 2016-02-14 DIAGNOSIS — I129 Hypertensive chronic kidney disease with stage 1 through stage 4 chronic kidney disease, or unspecified chronic kidney disease: Secondary | ICD-10-CM | POA: Insufficient documentation

## 2016-02-14 DIAGNOSIS — J387 Other diseases of larynx: Secondary | ICD-10-CM | POA: Insufficient documentation

## 2016-02-14 DIAGNOSIS — D3705 Neoplasm of uncertain behavior of pharynx: Secondary | ICD-10-CM | POA: Diagnosis not present

## 2016-02-14 DIAGNOSIS — Z955 Presence of coronary angioplasty implant and graft: Secondary | ICD-10-CM | POA: Insufficient documentation

## 2016-02-14 DIAGNOSIS — I2510 Atherosclerotic heart disease of native coronary artery without angina pectoris: Secondary | ICD-10-CM | POA: Diagnosis not present

## 2016-02-14 DIAGNOSIS — C771 Secondary and unspecified malignant neoplasm of intrathoracic lymph nodes: Secondary | ICD-10-CM | POA: Diagnosis not present

## 2016-02-14 DIAGNOSIS — J45909 Unspecified asthma, uncomplicated: Secondary | ICD-10-CM | POA: Insufficient documentation

## 2016-02-14 DIAGNOSIS — N189 Chronic kidney disease, unspecified: Secondary | ICD-10-CM | POA: Insufficient documentation

## 2016-02-14 DIAGNOSIS — J449 Chronic obstructive pulmonary disease, unspecified: Secondary | ICD-10-CM | POA: Insufficient documentation

## 2016-02-14 DIAGNOSIS — C3480 Malignant neoplasm of overlapping sites of unspecified bronchus and lung: Secondary | ICD-10-CM | POA: Diagnosis not present

## 2016-02-14 DIAGNOSIS — R599 Enlarged lymph nodes, unspecified: Secondary | ICD-10-CM | POA: Diagnosis not present

## 2016-02-14 DIAGNOSIS — M545 Low back pain: Secondary | ICD-10-CM | POA: Diagnosis not present

## 2016-02-14 DIAGNOSIS — R590 Localized enlarged lymph nodes: Secondary | ICD-10-CM

## 2016-02-14 DIAGNOSIS — F418 Other specified anxiety disorders: Secondary | ICD-10-CM | POA: Diagnosis not present

## 2016-02-14 DIAGNOSIS — F17210 Nicotine dependence, cigarettes, uncomplicated: Secondary | ICD-10-CM | POA: Insufficient documentation

## 2016-02-14 DIAGNOSIS — K219 Gastro-esophageal reflux disease without esophagitis: Secondary | ICD-10-CM | POA: Diagnosis not present

## 2016-02-14 DIAGNOSIS — C3492 Malignant neoplasm of unspecified part of left bronchus or lung: Secondary | ICD-10-CM

## 2016-02-14 DIAGNOSIS — C801 Malignant (primary) neoplasm, unspecified: Secondary | ICD-10-CM | POA: Diagnosis not present

## 2016-02-14 DIAGNOSIS — J370 Chronic laryngitis: Secondary | ICD-10-CM | POA: Diagnosis not present

## 2016-02-14 DIAGNOSIS — R918 Other nonspecific abnormal finding of lung field: Secondary | ICD-10-CM | POA: Insufficient documentation

## 2016-02-14 HISTORY — PX: VIDEO BRONCHOSCOPY WITH ENDOBRONCHIAL ULTRASOUND: SHX6177

## 2016-02-14 HISTORY — PX: DIRECT LARYNGOSCOPY: SHX5326

## 2016-02-14 LAB — TYPE AND SCREEN
ABO/RH(D): B NEG
Antibody Screen: NEGATIVE

## 2016-02-14 LAB — APTT: aPTT: 32 seconds (ref 24–36)

## 2016-02-14 LAB — ABO/RH: ABO/RH(D): B NEG

## 2016-02-14 SURGERY — LARYNGOSCOPY, DIRECT
Anesthesia: General

## 2016-02-14 SURGERY — BRONCHOSCOPY, WITH EBUS
Anesthesia: General | Site: Throat

## 2016-02-14 MED ORDER — PROPOFOL 10 MG/ML IV BOLUS
INTRAVENOUS | Status: AC
  Filled 2016-02-14: qty 20

## 2016-02-14 MED ORDER — ROCURONIUM BROMIDE 10 MG/ML (PF) SYRINGE
PREFILLED_SYRINGE | INTRAVENOUS | Status: DC | PRN
  Administered 2016-02-14: 50 mg via INTRAVENOUS

## 2016-02-14 MED ORDER — PHENYLEPHRINE 40 MCG/ML (10ML) SYRINGE FOR IV PUSH (FOR BLOOD PRESSURE SUPPORT)
PREFILLED_SYRINGE | INTRAVENOUS | Status: DC | PRN
  Administered 2016-02-14: 80 ug via INTRAVENOUS

## 2016-02-14 MED ORDER — ONDANSETRON HCL 4 MG/2ML IJ SOLN
INTRAMUSCULAR | Status: AC
  Filled 2016-02-14: qty 2

## 2016-02-14 MED ORDER — SUGAMMADEX SODIUM 200 MG/2ML IV SOLN
INTRAVENOUS | Status: AC
  Filled 2016-02-14: qty 2

## 2016-02-14 MED ORDER — OXYMETAZOLINE HCL 0.05 % NA SOLN: NASAL | Status: DC | PRN

## 2016-02-14 MED ORDER — MEPERIDINE HCL 25 MG/ML IJ SOLN: 6.2500 mg | INTRAMUSCULAR | Status: DC | PRN

## 2016-02-14 MED ORDER — LACTATED RINGERS IV SOLN: INTRAVENOUS | Status: DC

## 2016-02-14 MED ORDER — DEXTROSE 5 % IV SOLN
1.5000 g | INTRAVENOUS | Status: AC
  Administered 2016-02-14: 1.5 g via INTRAVENOUS
  Filled 2016-02-14: qty 1.5

## 2016-02-14 MED ORDER — OXYCODONE HCL 5 MG/5ML PO SOLN: 5.0000 mg | ORAL | 0 refills | Status: DC | PRN

## 2016-02-14 MED ORDER — PROMETHAZINE HCL 25 MG/ML IJ SOLN
6.2500 mg | INTRAMUSCULAR | Status: DC | PRN
Start: 2016-02-14 — End: 2016-02-14

## 2016-02-14 MED ORDER — SUFENTANIL CITRATE 50 MCG/ML IV SOLN
INTRAVENOUS | Status: AC
  Filled 2016-02-14: qty 1

## 2016-02-14 MED ORDER — EPINEPHRINE HCL (NASAL) 0.1 % NA SOLN
NASAL | Status: AC
  Filled 2016-02-14: qty 30

## 2016-02-14 MED ORDER — FENTANYL CITRATE (PF) 100 MCG/2ML IJ SOLN
25.0000 ug | INTRAMUSCULAR | Status: DC | PRN
  Administered 2016-02-14 (×2): 25 ug via INTRAVENOUS

## 2016-02-14 MED ORDER — EPINEPHRINE HCL 1 MG/ML IJ SOLN
INTRAMUSCULAR | Status: AC
  Filled 2016-02-14: qty 1

## 2016-02-14 MED ORDER — LIDOCAINE 2% (20 MG/ML) 5 ML SYRINGE
INTRAMUSCULAR | Status: AC
  Filled 2016-02-14: qty 5

## 2016-02-14 MED ORDER — MIDAZOLAM HCL 2 MG/2ML IJ SOLN
INTRAMUSCULAR | Status: AC
  Filled 2016-02-14: qty 2

## 2016-02-14 MED ORDER — ONDANSETRON HCL 4 MG/2ML IJ SOLN
INTRAMUSCULAR | Status: DC | PRN
  Administered 2016-02-14: 4 mg via INTRAVENOUS

## 2016-02-14 MED ORDER — ROCURONIUM BROMIDE 10 MG/ML (PF) SYRINGE
PREFILLED_SYRINGE | INTRAVENOUS | Status: AC
  Filled 2016-02-14: qty 10

## 2016-02-14 MED ORDER — LACTATED RINGERS IV SOLN
INTRAVENOUS | Status: DC | PRN
  Administered 2016-02-14: 08:00:00 via INTRAVENOUS

## 2016-02-14 MED ORDER — PROPOFOL 10 MG/ML IV BOLUS
INTRAVENOUS | Status: DC | PRN
  Administered 2016-02-14: 130 mg via INTRAVENOUS

## 2016-02-14 MED ORDER — SODIUM CHLORIDE 0.9 % IJ SOLN
INTRAMUSCULAR | Status: AC
  Filled 2016-02-14: qty 10

## 2016-02-14 MED ORDER — EPINEPHRINE HCL (NASAL) 0.1 % NA SOLN
NASAL | Status: DC | PRN
  Administered 2016-02-14: 30 mL via TOPICAL

## 2016-02-14 MED ORDER — SUGAMMADEX SODIUM 200 MG/2ML IV SOLN
INTRAVENOUS | Status: DC | PRN
  Administered 2016-02-14: 60 mg via INTRAVENOUS

## 2016-02-14 MED ORDER — FENTANYL CITRATE (PF) 100 MCG/2ML IJ SOLN
INTRAMUSCULAR | Status: AC
  Administered 2016-02-14: 25 ug via INTRAVENOUS
  Filled 2016-02-14: qty 2

## 2016-02-14 MED ORDER — LIDOCAINE 2% (20 MG/ML) 5 ML SYRINGE
INTRAMUSCULAR | Status: DC | PRN
  Administered 2016-02-14: 50 mg via INTRAVENOUS

## 2016-02-14 MED ORDER — 0.9 % SODIUM CHLORIDE (POUR BTL) OPTIME
TOPICAL | Status: DC | PRN
  Administered 2016-02-14: 1000 mL

## 2016-02-14 MED ORDER — SUFENTANIL CITRATE 50 MCG/ML IV SOLN
INTRAVENOUS | Status: DC | PRN
  Administered 2016-02-14: 10 ug via INTRAVENOUS
  Administered 2016-02-14: 5 ug via INTRAVENOUS

## 2016-02-14 MED ORDER — MIDAZOLAM HCL 5 MG/5ML IJ SOLN
INTRAMUSCULAR | Status: DC | PRN
  Administered 2016-02-14: 2 mg via INTRAVENOUS

## 2016-02-14 SURGICAL SUPPLY — 64 items
ADH SKN CLS APL DERMABOND .7 (GAUZE/BANDAGES/DRESSINGS)
BLADE SURG 10 STRL SS (BLADE) ×2 IMPLANT
BRUSH CYTOL CELLEBRITY 1.5X140 (MISCELLANEOUS) IMPLANT
CANISTER SUCTION 2500CC (MISCELLANEOUS) ×8 IMPLANT
CLIP TI MEDIUM 6 (CLIP) IMPLANT
CONT SPEC 4OZ CLIKSEAL STRL BL (MISCELLANEOUS) ×12 IMPLANT
COVER DOME SNAP 22 D (MISCELLANEOUS) ×4 IMPLANT
COVER SURGICAL LIGHT HANDLE (MISCELLANEOUS) ×4 IMPLANT
COVER TABLE BACK 60X90 (DRAPES) ×6 IMPLANT
DERMABOND ADVANCED (GAUZE/BANDAGES/DRESSINGS)
DERMABOND ADVANCED .7 DNX12 (GAUZE/BANDAGES/DRESSINGS) ×2 IMPLANT
DRAPE LAPAROTOMY T 102X78X121 (DRAPES) ×2 IMPLANT
DRAPE PROXIMA HALF (DRAPES) ×2 IMPLANT
DRSG AQUACEL AG ADV 3.5X14 (GAUZE/BANDAGES/DRESSINGS) ×2 IMPLANT
ELECT CAUTERY BLADE 6.4 (BLADE) ×2 IMPLANT
ELECT REM PT RETURN 9FT ADLT (ELECTROSURGICAL)
ELECTRODE REM PT RTRN 9FT ADLT (ELECTROSURGICAL) ×2 IMPLANT
FORCEPS BIOP RJ4 1.8 (CUTTING FORCEPS) IMPLANT
FORCEPS RADIAL JAW LRG 4 PULM (INSTRUMENTS) IMPLANT
GAUZE SPONGE 4X4 12PLY STRL (GAUZE/BANDAGES/DRESSINGS) ×4 IMPLANT
GAUZE SPONGE 4X4 16PLY XRAY LF (GAUZE/BANDAGES/DRESSINGS) ×2 IMPLANT
GLOVE BIO SURGEON STRL SZ 6.5 (GLOVE) ×6 IMPLANT
GLOVE BIO SURGEON STRL SZ7.5 (GLOVE) ×4 IMPLANT
GLOVE SURG SS PI 7.0 STRL IVOR (GLOVE) ×4 IMPLANT
GOWN STRL REUS W/ TWL LRG LVL3 (GOWN DISPOSABLE) ×8 IMPLANT
GOWN STRL REUS W/TWL LRG LVL3 (GOWN DISPOSABLE) ×8
GUARD TEETH (MISCELLANEOUS) ×2 IMPLANT
HEMOSTAT SURGICEL 2X14 (HEMOSTASIS) IMPLANT
KIT BASIN OR (CUSTOM PROCEDURE TRAY) ×4 IMPLANT
KIT CLEAN ENDO COMPLIANCE (KITS) ×10 IMPLANT
KIT ROOM TURNOVER OR (KITS) ×8 IMPLANT
MARKER SKIN DUAL TIP RULER LAB (MISCELLANEOUS) ×4 IMPLANT
NDL BIOPSY TRANSBRONCH 21G (NEEDLE) IMPLANT
NDL BLUNT 18X1 FOR OR ONLY (NEEDLE) IMPLANT
NDL EBUS SONO TIP PENTAX (NEEDLE) ×2 IMPLANT
NDL HYPO 25GX1X1/2 BEV (NEEDLE) IMPLANT
NEEDLE BIOPSY TRANSBRONCH 21G (NEEDLE) IMPLANT
NEEDLE BLUNT 18X1 FOR OR ONLY (NEEDLE) IMPLANT
NEEDLE EBUS SONO TIP PENTAX (NEEDLE) ×4 IMPLANT
NEEDLE HYPO 25GX1X1/2 BEV (NEEDLE) IMPLANT
NS IRRIG 1000ML POUR BTL (IV SOLUTION) ×8 IMPLANT
OIL SILICONE PENTAX (PARTS (SERVICE/REPAIRS)) ×4 IMPLANT
PACK SURGICAL SETUP 50X90 (CUSTOM PROCEDURE TRAY) ×2 IMPLANT
PAD ARMBOARD 7.5X6 YLW CONV (MISCELLANEOUS) ×16 IMPLANT
PATTIES SURGICAL .5 X1 (DISPOSABLE) ×2 IMPLANT
PENCIL BUTTON HOLSTER BLD 10FT (ELECTRODE) ×2 IMPLANT
RADIAL JAW LRG 4 PULMONARY (INSTRUMENTS)
SOLUTION ANTI FOG 6CC (MISCELLANEOUS) IMPLANT
SPONGE GAUZE 4X4 12PLY STER LF (GAUZE/BANDAGES/DRESSINGS) ×4 IMPLANT
SPONGE INTESTINAL PEANUT (DISPOSABLE) IMPLANT
STAPLER VISISTAT 35W (STAPLE) IMPLANT
SUT VIC AB 3-0 SH 18 (SUTURE) ×2 IMPLANT
SUT VICRYL 4-0 PS2 18IN ABS (SUTURE) ×2 IMPLANT
SWAB COLLECTION DEVICE MRSA (MISCELLANEOUS) IMPLANT
SYR 20CC LL (SYRINGE) ×4 IMPLANT
SYR 20ML ECCENTRIC (SYRINGE) ×4 IMPLANT
SYRINGE 10CC LL (SYRINGE) ×2 IMPLANT
TOWEL OR 17X24 6PK STRL BLUE (TOWEL DISPOSABLE) ×6 IMPLANT
TOWEL OR 17X26 10 PK STRL BLUE (TOWEL DISPOSABLE) ×4 IMPLANT
TRAP SPECIMEN MUCOUS 40CC (MISCELLANEOUS) ×4 IMPLANT
TUBE ANAEROBIC SPECIMEN COL (MISCELLANEOUS) IMPLANT
TUBE CONNECTING 12X1/4 (SUCTIONS) ×6 IMPLANT
TUBE CONNECTING 20X1/4 (TUBING) ×4 IMPLANT
WATER STERILE IRR 1000ML POUR (IV SOLUTION) ×4 IMPLANT

## 2016-02-14 NOTE — Op Note (Signed)
DATE OF PROCEDURE:  02/14/2016                              OPERATIVE REPORT  SURGEON:  Leta Baptist, MD  PREOPERATIVE DIAGNOSES: 1. Right vallecular cyst  POSTOPERATIVE DIAGNOSES: 1. Right vallecular cyst  PROCEDURE PERFORMED:  Direct laryngoscopy with removal of vallecular cyst.  ANESTHESIA:  General endotracheal tube anesthesia.  COMPLICATIONS:  None.  ESTIMATED BLOOD LOSS:  Minimal.  INDICATION FOR PROCEDURE:  Michele [name] is a 60 y.o. female with a history of a right vallecular cyst and right-sided throat discomfort. On examination, the patient was noted to have a 1.5 cm cystic mass within the right vallecular region. No surface ulceration or other suspicious mass or lesion was noted. Based on the above findings, the decision was made for the patient to undergo the above-stated procedure. Likelihood of success in reducing symptoms was also discussed.  The risks, benefits, alternatives, and details of the procedure were discussed with the patient.  Questions were invited and answered.  Informed consent was obtained. It should also be noted that the patient was recently noted to have multiple mediastinal lymphadenopathy. The findings were concerning for malignancy. She was scheduled to undergo biopsy of the mediastinal lesions by Dr. Pia Mau.  DESCRIPTION:  The patient was taken to the operating room and placed supine on the operating table.  General endotracheal tube anesthesia was administered by the anesthesiologist.  The patient was positioned and prepped and draped in a standard fashion for direct laryngoscopy.   A Dedo laryngoscope was used for examination. It was inserted via the oral cavity into the pharynx. A 1.5 cm cystic lesion was noted within the right vallecula. The epiglottis, aryepiglottic folds, piriform sinuses, and true and false vocal cords were all noted to be normal. The vallecula cyst was then removed using a large cup forceps. The cyst was noted to be filled with  serous fluid. The cystic capsule fragments were sent to the pathology department for permanent histologic identification.   The care of the patient was turned over to the anesthesiologist.    OPERATIVE FINDINGS:  A 1.5 cm cystic mass was noted within the right vallecula.  SPECIMEN:  Right vallecular cyst  FOLLOWUP CARE:  The patient will be discharged home once awake and alert.  The patient will follow up in my office in approximately 1 week.  Matheau Orona,SUI W 02/14/2016 9:10 AM

## 2016-02-14 NOTE — Telephone Encounter (Signed)
Patient called me back. I gave her an appt for her to be seen on 02/26/16 arrive at 1:45 with labs at 2:00 and see Dr. Julien Nordmann at 2:15.  She verbalized understanding of appt time and place.

## 2016-02-14 NOTE — Progress Notes (Signed)
Pt does not want to go home. Dr. Servando Snare & Dr. Benjamine Mola aware. Pt has many family members present & there is no valid reason to keep her as inpatient.  Pt made aware & verbalizes understanding.

## 2016-02-14 NOTE — Anesthesia Preprocedure Evaluation (Addendum)
Anesthesia Evaluation  Patient identified by MRN, date of birth, ID band Patient awake    Reviewed: Allergy & Precautions, NPO status , Patient's Chart, lab work & pertinent test results  Airway Mallampati: II  TM Distance: >3 FB Neck ROM: Full    Dental  (+) Edentulous Upper, Edentulous Lower, Dental Advisory Given   Pulmonary asthma , COPD, Current Smoker,     + decreased breath sounds      Cardiovascular hypertension, + angina + CAD, + Past MI and + Cardiac Stents  + dysrhythmias  Rhythm:Regular Rate:Normal     Neuro/Psych Seizures -, Well Controlled,  PSYCHIATRIC DISORDERS Anxiety Depression  Neuromuscular disease    GI/Hepatic hiatal hernia, GERD  Medicated,(+) Hepatitis -  Endo/Other  negative endocrine ROS  Renal/GU Renal disease  negative genitourinary   Musculoskeletal  (+) Arthritis ,   Abdominal   Peds negative pediatric ROS (+)  Hematology negative hematology ROS (+)   Anesthesia Other Findings   Reproductive/Obstetrics negative OB ROS                           02/2016 EKG: normal sinus rhythm.  02/2016 Echo Myocardial perfusion is normal. The study is normal. This is a low risk study. Overall left ventricular systolic function was abnormal. LV cavity size is normal. Nuclear stress EF: 50%. The left ventricular ejection fraction is mildly decreased (45-54%). There is no prior study for comparison.  12/2013 Echo - Left ventricle: The cavity size was normal. Wall thickness was normal. Systolic function was vigorous. The estimated ejection fraction was in the range of 65% to 70%. Wall motion was normal; there were no regional wall motion abnormalities. There was an increased relative contribution of atrial contraction to ventricular filling.   Anesthesia Physical Anesthesia Plan  ASA: III  Anesthesia Plan: General   Post-op Pain Management:    Induction:  Intravenous  Airway Management Planned: Oral ETT  Additional Equipment:   Intra-op Plan: Utilization Of Total Body Hypothermia per surgeon request  Post-operative Plan: Extubation in OR  Informed Consent: I have reviewed the patients History and Physical, chart, labs and discussed the procedure including the risks, benefits and alternatives for the proposed anesthesia with the patient or authorized representative who has indicated his/her understanding and acceptance.   Dental advisory given  Plan Discussed with: CRNA, Anesthesiologist and Surgeon  Anesthesia Plan Comments: (Arterial line or Pulse Ox on the R side. )      Anesthesia Quick Evaluation

## 2016-02-14 NOTE — Anesthesia Postprocedure Evaluation (Signed)
Anesthesia Post Note  Patient: Michele [name]  Procedure(s) Performed: Procedure(s) (LRB): VIDEO BRONCHOSCOPY WITH ENDOBRONCHIAL ULTRASOUND (N/A) DIRECT LARYNGOSCOPY AND BIOPSY (N/A)  Patient location during evaluation: PACU Anesthesia Type: General Level of consciousness: awake and alert Pain management: pain level controlled Vital Signs Assessment: post-procedure vital signs reviewed and stable Respiratory status: spontaneous breathing, nonlabored ventilation, respiratory function stable and patient connected to nasal cannula oxygen Cardiovascular status: blood pressure returned to baseline and stable Postop Assessment: no signs of nausea or vomiting Anesthetic complications: no    Last Vitals:  Vitals:   02/14/16 1045 02/14/16 1053  BP:  (!) 117/58  Pulse: 60 61  Resp: 14   Temp:      Last Pain:  Vitals:   02/14/16 1015  TempSrc:   PainSc: [name]
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
## 2016-02-14 NOTE — Brief Op Note (Signed)
      [address]       [place] [address]             [phone]      02/14/2016  9:49 AM  PATIENT:  Michele [name]  60 y.o. female  PRE-OPERATIVE DIAGNOSIS:  mediastinal adenopathy  POST-OPERATIVE DIAGNOSIS:  mediastinal adenopathy, non small cell lung cancer final path pending   PROCEDURE:  Procedure(s): VIDEO BRONCHOSCOPY WITH ENDOBRONCHIAL ULTRASOUND (N/A) DIRECT LARYNGOSCOPY AND BIOPSY (N/A)  SURGEON:  Surgeon(s) and Role: Panel 1:    * Grace Isaac, MD - Primary  Panel 2:    * Leta Baptist, MD - Primary   ANESTHESIA:   general  EBL:  Total I/O In: 750 [I.V.:750] Out: 10 [Blood:10]  BLOOD ADMINISTERED:none  DRAINS: none   LOCAL MEDICATIONS USED:  XYLOCAINE   SPECIMEN:  No Specimen  DISPOSITION OF SPECIMEN:  PATHOLOGY  COUNTS:  YES  DICTATION: .Dragon Dictation  PLAN OF CARE: Discharge to home after PACU  PATIENT DISPOSITION:  PACU - hemodynamically stable.   Delay start of Pharmacological VTE agent (>24hrs) due to surgical blood loss or risk of bleeding: yes

## 2016-02-14 NOTE — Telephone Encounter (Signed)
Oncology Nurse Navigator Documentation  Oncology Nurse Navigator Flowsheets 02/14/2016  Navigator Encounter Type Telephone;Introductory phone call/I received referral on Ms. Shreve today.  I called and left a vm message to call with my name and phone number  Telephone Outgoing Call  Confirmed Diagnosis Date 02/14/2016  Treatment Phase Pre-Tx/Tx Discussion  Barriers/Navigation Needs Coordination of Care  Interventions Coordination of Care  Coordination of Care Appts  Acuity Level 1  Time Spent with Patient 15

## 2016-02-14 NOTE — Discharge Instructions (Addendum)
Post laryngoscopy instructions:  The patient may resume all her previous activities and diet. She may resume her Plavix today. The patient will follow-up in my office in one week.  ---  Flexible Bronchoscopy, Care After Refer to this sheet in the next few weeks. These instructions provide you with information on caring for yourself after your procedure. Your health care provider may also give you more specific instructions. Your treatment has been planned according to current medical practices, but problems sometimes occur. Call your health care provider if you have any problems or questions after your procedure.  WHAT TO EXPECT AFTER THE PROCEDURE It is normal to have the following symptoms for 24-48 hours after the procedure:   Increased cough.  Low-grade fever.  Sore throat or hoarse voice.  Small streaks of blood in your thick spit (sputum) if tissue samples were taken (biopsy). HOME CARE INSTRUCTIONS   Do not eat or drink anything for 2 hours after your procedure. Your nose and throat were numbed by medicine. If you try to eat or drink before the medicine wears off, food or drink could go into your lungs or you could burn yourself. After the numbness is gone and your cough and gag reflexes have returned, you may eat soft food and drink liquids slowly.   The day after the procedure, you can go back to your normal diet.   You may resume normal activities.   Keep all follow-up visits as directed by your health care provider. It is important to keep all your appointments, especially if tissue samples were taken for testing (biopsy). SEEK IMMEDIATE MEDICAL CARE IF:   You have increasing shortness of breath.   You become light-headed or faint.   You have chest pain.   You have any new concerning symptoms.  You cough up more than a small amount of blood.  The amount of blood you cough up increases. MAKE SURE YOU:  Understand these instructions.  Will watch your  condition.  Will get help right away if you are not doing well or get worse.   This information is not intended to replace advice given to you by your health care provider. Make sure you discuss any questions you have with your health care provider.   Document Released: 12/07/2004 Document Revised: 06/10/2014 Document Reviewed: 01/22/2013 Elsevier Interactive Patient Education [employer]. --------------------------------

## 2016-02-14 NOTE — H&P (Signed)
Cc: Vallecular mass  HPI: The patient is a 60 year old female who presents today for evaluation of her vallecular mass.  The patient is seen in consultation requested by Dr. [name].  According to the patient, she has been experiencing right sided throat discomfort since 09/2015.  Her initial symptom was accompanied by swallowing difficulty.  Her dysphagia has since resolved.  The patient was evaluated by Dr. Elie Goody. 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  The patient presents today for a second opinion regarding her vallecular mass.  The patient also reports new onset right sided otalgia over the past 2 weeks.  She currently smokes 3 packs per day of cigarettes. She has been smoking for 30+ years.  She has a history of COPD and coronary artery disease.  She previously underwent angioplasty to treat her coronary artery disease.  She is currently on Plavix.  Currently she denies any dysphagia, odynophagia or dyspnea.    The patient's review of systems (constitutional, eyes, ENT, cardiovascular, respiratory, GI, musculoskeletal, skin, neurologic, psychiatric, endocrine, hematologic, allergic) is noted in the ROS questionnaire.  It is reviewed with the patient.  Family health history: Heart disease, diabetes, hearing loss.   Major events: Cardiac stent.   Ongoing medical problems: Emphysema, bronchitis, arthritis, osteoarthritis, anxiety disorder, weight loss.   Social history: The patient is single. She smokes 2 pack of cigarettes a day. She denies the use of alcohol or illegal drugs.   Exam General: Communicates without difficulty, well nourished, no acute distress. Head: Normocephalic, no evidence injury, no tenderness, facial buttresses intact without stepoff. Eyes: PERRL, EOMI. No scleral icterus, conjunctivae clear. Neuro: CN II exam reveals vision grossly intact.  No nystagmus at any point of gaze. Ears: Auricles well formed without lesions.  Ear canals are intact without  mass or lesion.  No erythema or edema is appreciated.  The TMs are intact without fluid. Nose: External evaluation reveals normal support and skin without lesions.  Dorsum is intact.  Anterior rhinoscopy reveals healthy pink mucosa over anterior aspect of inferior turbinates and intact septum.  No purulence noted. Oral:  Oral cavity and oropharynx are intact, symmetric, without erythema or edema.  Mucosa is moist without lesions. Neck: Full range of motion without pain.  There is no significant lymphadenopathy.  No masses palpable.  Thyroid bed within normal limits to palpation.  Parotid glands and submandibular glands equal bilaterally without mass.  Trachea is midline. Neuro:  CN 2-12 grossly intact. Gait normal.   Procedure:  Flexible Fiberoptic Laryngoscopy Risks, benefits, and alternatives of flexible endoscopy were explained to the patient.  Specific mention was made of the risk of throat numbness with difficulty swallowing, possible bleeding from the nose and mouth, and pain from the procedure.  The patient gave oral consent to proceed.  The nasal cavities were decongested and anesthetised with a combination of oxymetazoline and 4% lidocaine solution.  The flexible scope was inserted into the right nasal cavity and advanced towards the nasopharynx.  Visualized mucosa over the turbinates and septum were as described above.  The nasopharynx was clear.  Oropharyngeal walls were symmetric and mobile without lesion, mass, or edema.  Hypopharynx was also without  lesion or edema.  Larynx was mobile without lesions. 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  The lesion appears to be cystic without any surface ulceration.  True vocal folds were white without mass or lesion.  Base of tongue was within normal limits.   Assessment 1.  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  The lesion appears to be cystic without any surface ulceration.  2.  No other suspicious mass or lesion is noted on today's laryngoscopy  exam.  3. The rest of her ENT exam is unremarkable.   Plan  1.  The physical exam and laryngoscopy findings are reviewed with the patient.  2.  Based on the above findings, the patient should have her vallecular mass removed. Even though the appearance of the vallecular mass is suggestive of a benign lesion, her long smoking history predisposes the patient to upper respiratory malignancy.  3. The risks, benefits and details of the procedure are reviewed with the patient. Questions are invited and answered.  4.  The patient would like to proceed with direct laryngoscopy and vallecular mass excision procedure.

## 2016-02-14 NOTE — Telephone Encounter (Signed)
Oncology Nurse Navigator Documentation  Oncology Nurse Navigator Flowsheets 02/14/2016  Navigator Encounter Type Telephone/patient called and left me a vm message.  She is unclear of appt.  I called her back and clarified appt's.   Telephone Outgoing Call  Treatment Phase Pre-Tx/Tx Discussion  Barriers/Navigation Needs Education  Education Other  Interventions Education Method  Education Method Verbal  Acuity Level 2  Time Spent with Patient 30

## 2016-02-14 NOTE — Telephone Encounter (Signed)
Oncology Nurse Navigator Documentation  Oncology Nurse Navigator Flowsheets 02/14/2016  Navigator Encounter Type Telephone/I called to set up an appt for patient.  I was unable to reach.  I left my name and phone number to call   Telephone Outgoing Call  Treatment Phase Pre-Tx/Tx Discussion  Barriers/Navigation Needs Coordination of Care  Interventions Coordination of Care  Coordination of Care Appts  Acuity Level 1  Time Spent with Patient 15

## 2016-02-14 NOTE — Addendum Note (Signed)
Addendum  created 02/14/16 1619 by Moshe Salisbury, CRNA   Anesthesia Event edited

## 2016-02-14 NOTE — Telephone Encounter (Signed)
Called pt with instructions for PET scan scheduled 9/15 @ 8:30.  Told pt to call office for details/instructions.

## 2016-02-14 NOTE — Anesthesia Procedure Notes (Signed)
Procedure Name: Intubation Date/Time: 02/14/2016 8:30 AM Performed by: Melina Copa, Lamae Fosco R Pre-anesthesia Checklist: Patient identified, Emergency Drugs available, Suction available and Patient being monitored Patient Re-evaluated:Patient Re-evaluated prior to inductionOxygen Delivery Method: Circle System Utilized Preoxygenation: Pre-oxygenation with 100% oxygen Intubation Type: IV induction Ventilation: Mask ventilation without difficulty Laryngoscope Size: Mac and 3 Grade View: Grade II Tube type: Oral Tube size: 9.0 mm Number of attempts: 1 Airway Equipment and Method: Stylet Placement Confirmation: ETT inserted through vocal cords under direct vision,  positive ETCO2 and breath sounds checked- equal and bilateral Secured at: 18 cm Tube secured with: Tape Dental Injury: Teeth and Oropharynx as per pre-operative assessment

## 2016-02-14 NOTE — Transfer of Care (Signed)
Immediate Anesthesia Transfer of Care Note  Patient: Michele [name]  Procedure(s) Performed: Procedure(s): VIDEO BRONCHOSCOPY WITH ENDOBRONCHIAL ULTRASOUND (N/A) DIRECT LARYNGOSCOPY AND BIOPSY (N/A)  Patient Location: PACU  Anesthesia Type:General  Level of Consciousness: awake, oriented and patient cooperative  Airway & Oxygen Therapy: Patient Spontanous Breathing and Patient connected to nasal cannula oxygen  Post-op Assessment: Report given to RN, Post -op Vital signs reviewed and stable and Patient moving all extremities  Post vital signs: Reviewed and stable  Last Vitals:  Vitals:   02/14/16 0705  BP: (!) 97/55  Pulse: 72  Resp: 20  Temp: 36.6 C    Last Pain:  Vitals:   02/14/16 0705  TempSrc: Oral  PainSc:       Patients Stated Pain Goal: 3 (04/04/10 1735)  Complications: No apparent anesthesia complications

## 2016-02-15 ENCOUNTER — Encounter (HOSPITAL_COMMUNITY): Payer: Self-pay | Admitting: Cardiothoracic Surgery

## 2016-02-15 NOTE — Op Note (Signed)
NAMELANORE, RENDEROS NO.:  [id number]  MEDICAL RECORD NO.:  [id number]  LOCATION:  [facility]                         FACILITY:  [facility]  PHYSICIAN:  Lanelle Bal, MD    DATE OF BIRTH:  1955-10-09  DATE OF PROCEDURE:  02/14/2016 DATE OF DISCHARGE:  02/14/2016                              OPERATIVE REPORT   PREOPERATIVE DIAGNOSIS:  Mediastinal adenopathy.  POSTOPERATIVE DIAGNOSIS:  Mediastinal adenopathy.  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  PROCEDURE PERFORMED:  Bronchoscopy with endobronchial ultrasound and transbronchial biopsy.  SURGEON:  Lanelle Bal, MD  BRIEF HISTORY:  The patient is a 60 year old female, who had been seen by ENT for evaluation of right neck pain and a cyst in the vallecula on her preop workup prior to direct laryngoscopy.  Chest x-ray was obtained, which suggested mediastinal widening suggestive of aortic aneurysm.  A CT scan was then performed, which showed no evidence of aneurysm, but did show extensive mediastinal adenopathy.  We coordinated with Dr. Benjamine Mola to perform a direct laryngoscopy followed by bronchoscopy, transbronchial biopsy with EBUS and possible mediastinoscopy to obtain a tissue diagnosis.  Risks and options were discussed with the patient in detail.  She was agreeable and signed informed consent.  DESCRIPTION OF PROCEDURE:  The patient was taken to the operating room and initially underwent general endotracheal anesthesia and dictated under separate note is the procedure done initially by Dr. Benjamine Mola.  He completed this and then we proceeded with a second time-out through the endotracheal tube.  A fiberoptic bronchoscopy was performed to the subsegmental level without evidence of endobronchial lesions.  The scope was then removed and the EBUS scope placed and then large #7 nodes were easily identifiable with the EBUS scope and multiple passes with aspirating needle was performed.  Cytologic slides were created,  initial smears were consistent with non-small cell cancer.  Additional passes in the same #7 nodes was performed and submitted in cytology, marked, maximized cellblock for genetic testing as necessary.  The patient tolerated the procedure without difficulty.  The bronchoscope was removed.  There was minimal bleeding in the tracheobronchial tree.  The patient was extubated in the operating room and transferred to the recovery room for postoperative observation.     Lanelle Bal, MD     EG/MEDQ  D:  02/14/2016  T:  02/15/2016  Job:  [id number]

## 2016-02-16 ENCOUNTER — Encounter
Admission: RE | Admit: 2016-02-16 | Discharge: 2016-02-16 | Disposition: A | Discharge status: Home / Self Care | Payer: Medicare Other | Source: Ambulatory Visit | Attending: Cardiothoracic Surgery | Admitting: Cardiothoracic Surgery

## 2016-02-16 DIAGNOSIS — C3490 Malignant neoplasm of unspecified part of unspecified bronchus or lung: Secondary | ICD-10-CM | POA: Diagnosis not present

## 2016-02-16 DIAGNOSIS — C3492 Malignant neoplasm of unspecified part of left bronchus or lung: Secondary | ICD-10-CM | POA: Insufficient documentation

## 2016-02-16 LAB — GLUCOSE, CAPILLARY: Glucose-Capillary: 108 mg/dL — ABNORMAL HIGH (ref 65–99)

## 2016-02-16 MED ORDER — FLUDEOXYGLUCOSE F - 18 (FDG) INJECTION
12.3500 | Freq: Once | INTRAVENOUS | Status: AC | PRN
  Administered 2016-02-16: 12.35 via INTRAVENOUS

## 2016-02-18 ENCOUNTER — Other Ambulatory Visit: Payer: Self-pay | Admitting: Cardiology

## 2016-02-18 DIAGNOSIS — E785 Hyperlipidemia, unspecified: Secondary | ICD-10-CM

## 2016-02-18 DIAGNOSIS — Z79899 Other long term (current) drug therapy: Secondary | ICD-10-CM

## 2016-02-21 DIAGNOSIS — D109 Benign neoplasm of pharynx, unspecified: Secondary | ICD-10-CM | POA: Diagnosis not present

## 2016-02-26 ENCOUNTER — Encounter: Payer: Self-pay | Admitting: Internal Medicine

## 2016-02-26 ENCOUNTER — Telehealth: Payer: Self-pay | Admitting: *Deleted

## 2016-02-26 ENCOUNTER — Ambulatory Visit (HOSPITAL_BASED_OUTPATIENT_CLINIC_OR_DEPARTMENT_OTHER): Payer: Medicare Other | Admitting: Internal Medicine

## 2016-02-26 ENCOUNTER — Encounter: Payer: Self-pay | Admitting: *Deleted

## 2016-02-26 ENCOUNTER — Other Ambulatory Visit (HOSPITAL_BASED_OUTPATIENT_CLINIC_OR_DEPARTMENT_OTHER): Payer: Medicare Other

## 2016-02-26 DIAGNOSIS — Z720 Tobacco use: Secondary | ICD-10-CM | POA: Diagnosis not present

## 2016-02-26 DIAGNOSIS — C3491 Malignant neoplasm of unspecified part of right bronchus or lung: Secondary | ICD-10-CM | POA: Insufficient documentation

## 2016-02-26 DIAGNOSIS — C3490 Malignant neoplasm of unspecified part of unspecified bronchus or lung: Secondary | ICD-10-CM

## 2016-02-26 DIAGNOSIS — R918 Other nonspecific abnormal finding of lung field: Secondary | ICD-10-CM

## 2016-02-26 HISTORY — DX: Malignant neoplasm of unspecified part of unspecified bronchus or lung: C34.90

## 2016-02-26 LAB — CBC WITH DIFFERENTIAL/PLATELET
BASO%: 0.6 % (ref 0.0–2.0)
Basophils Absolute: 0 10*3/uL (ref 0.0–0.1)
EOS%: 1.1 % (ref 0.0–7.0)
Eosinophils Absolute: 0.1 10*3/uL (ref 0.0–0.5)
HCT: 42.4 % (ref 34.8–46.6)
HGB: 14.1 g/dL (ref 11.6–15.9)
LYMPH%: 16.6 % (ref 14.0–49.7)
MCH: 30 pg (ref 25.1–34.0)
MCHC: 33.2 g/dL (ref 31.5–36.0)
MCV: 90.4 fL (ref 79.5–101.0)
MONO#: 0.4 10*3/uL (ref 0.1–0.9)
MONO%: 7 % (ref 0.0–14.0)
NEUT#: 3.8 10*3/uL (ref 1.5–6.5)
NEUT%: 74.7 % (ref 38.4–76.8)
Platelets: 187 10*3/uL (ref 145–400)
RBC: 4.69 10*6/uL (ref 3.70–5.45)
RDW: 13.7 % (ref 11.2–14.5)
WBC: 5.1 10*3/uL (ref 3.9–10.3)
lymph#: 0.8 10*3/uL — ABNORMAL LOW (ref 0.9–3.3)

## 2016-02-26 LAB — COMPREHENSIVE METABOLIC PANEL
ALT: 10 U/L (ref 0–55)
AST: 27 U/L (ref 5–34)
Albumin: 3.5 g/dL (ref 3.5–5.0)
Alkaline Phosphatase: 59 U/L (ref 40–150)
Anion Gap: 8 mEq/L (ref 3–11)
BUN: 13 mg/dL (ref 7.0–26.0)
CO2: 28 mEq/L (ref 22–29)
Calcium: 9.5 mg/dL (ref 8.4–10.4)
Chloride: 107 mEq/L (ref 98–109)
Creatinine: 0.7 mg/dL (ref 0.6–1.1)
EGFR: 87 mL/min/{1.73_m2} — ABNORMAL LOW (ref >90)
Glucose: 113 mg/dl (ref 70–140)
Potassium: 4.4 mEq/L (ref 3.5–5.1)
Sodium: 142 mEq/L (ref 136–145)
Total Bilirubin: 0.51 mg/dL (ref 0.20–1.20)
Total Protein: 6.6 g/dL (ref 6.4–8.3)

## 2016-02-26 NOTE — Progress Notes (Signed)
START ON PATHWAY REGIMEN - Small Cell Lung  LOS15: Etoposide Days 1, 2, 3 + Cisplatin Day 1 q21 Days x 4 Cycles with Concurrent Radiation**   A cycle is every 21 days:     Etoposide (Toposar(R)) 100 mg/m2 in a total of 500 mL NS IV over 2 hours days 1, 2, and 3 Dose Mod: None     Cisplatin (Platinol(R)) 75 mg/m2 in a total of 500 mL NS IV over 2 hours day 1 only. **Prehydrate and consider post-hydration** Dose Mod: None  **Always confirm dose/schedule in your pharmacy ordering system**    Patient Characteristics: Limited Stage, First Line Stage Grouping: Limited AJCC M Stage: 0 AJCC N Stage: 2 AJCC T Stage: 0 Line of therapy: First Line Would you be surprised if this patient died  in the next year? I would be surprised if this patient died in the next year  Intent of Therapy: Curative Intent, Discussed with Patient
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
## 2016-02-26 NOTE — Progress Notes (Signed)
Oncology Nurse Navigator Documentation  Oncology Nurse Navigator Flowsheets 02/26/2016  Navigator Encounter Type Clinic/MDC/spoke with patient and boyfriend today at [facility]. Helped to educate and understand next steps.   Treatment Phase Pre-Tx/Tx Discussion  Barriers/Navigation Needs Coordination of Care;Education  Education Other  Interventions Education Method;Coordination of Care  Coordination of Care Other  Education Method Verbal  Acuity Level 2  Acuity Level 2 Educational needs;Other  Time Spent with Patient 30

## 2016-02-26 NOTE — Telephone Encounter (Signed)
Oncology Nurse Navigator Documentation  Oncology Nurse Navigator Flowsheets 02/26/2016  Navigator Encounter Type Telephone/patient called.  She had questions about her appt.  I called her and explained.    Telephone Outgoing Call  Treatment Phase Pre-Tx/Tx Discussion  Barriers/Navigation Needs Education  Education Other  Interventions Education Method  Education Method Verbal  Acuity Level 1  Time Spent with Patient 15

## 2016-02-27 ENCOUNTER — Telehealth: Payer: Self-pay | Admitting: *Deleted

## 2016-02-27 ENCOUNTER — Telehealth: Payer: Self-pay | Admitting: Internal Medicine

## 2016-02-27 ENCOUNTER — Telehealth: Payer: Self-pay | Admitting: Cardiology

## 2016-02-27 ENCOUNTER — Encounter: Payer: Self-pay | Admitting: *Deleted

## 2016-02-27 NOTE — Telephone Encounter (Signed)
Re in lobby, over 20 mins spent with pt discussing her concerns regarding copy of imaging, diagnosis and future appts. Informed pt per MD, Dr. Julien Nordmann spoke with pathology and pt has small cell lung cancer.  Escorted pt to scheduling. No further concerns.

## 2016-02-27 NOTE — Telephone Encounter (Signed)
I expected this based on her CT scan findings.  I just did not want to be the one to reveal a results to her. Please let her know I was aware, and I am thinking about her.  Glenetta Hew, MD

## 2016-02-27 NOTE — Progress Notes (Signed)
Oncology Nurse Navigator Documentation  Oncology Nurse Navigator Flowsheets 02/27/2016  Navigator Encounter Type Telephone/I received a message from triage that Michele [name] cousin wanted to be called regarding her treatment.  He stated patient was unclear on treatment plan.  I called Michele [name] and helped to explain next treatment plan.  She asked several questions and I explained.  She asked that I call her cousin to explain treatment plan.  The cousin Michele [name] is on HIPPA form.  I called him.  I updated him on treatment plan.  He also had several questions and I explained.  He was thankful for the call and update.   Telephone Outgoing Call  Treatment Phase Pre-Tx/Tx Discussion  Barriers/Navigation Needs Education  Education Pain/ Symptom Management;Understanding Cancer/ Treatment Options;Newly Diagnosed Cancer Education  Interventions Education Method  Education Method Verbal  Acuity Level 4  Acuity Level 2 Educational needs;Ongoing guidance and education throughout treatment as needed  Time Spent with Patient 8

## 2016-02-27 NOTE — Telephone Encounter (Signed)
Oncology Nurse Navigator Documentation  Oncology Nurse Navigator Flowsheets 02/27/2016  Navigator Encounter Type Telephone/I called patient to update her on her appt for her MRI Brain.  Patient had other questions and I clarified.   Telephone Outgoing Call  Treatment Phase Pre-Tx/Tx Discussion  Barriers/Navigation Needs Coordination of Care  Interventions Coordination of Care  Coordination of Care Appts  Acuity Level 2  Acuity Level 2 Assistance expediting appointments  Time Spent with Patient 30

## 2016-02-27 NOTE — Telephone Encounter (Signed)
Patient/family stopped by today for schedule and were given appointments for October thru December. [facility] radiology will call re scan - patient aware. Appointments schedule per 9/25 los.

## 2016-02-27 NOTE — Telephone Encounter (Signed)
Spoke to patient. Informed patient, will give information to Dr [name]. ONCOLOGIST  - Dr Earlie Server [facility] Start treamtent on Monday , per patient it's inoperable.

## 2016-02-27 NOTE — Telephone Encounter (Signed)
New message     Patient calling found out yesterday she had cancer in lump node .    extreme radiation and chemo starts on Monday.

## 2016-02-28 ENCOUNTER — Other Ambulatory Visit: Payer: Self-pay | Admitting: Internal Medicine

## 2016-02-28 MED ORDER — PROCHLORPERAZINE MALEATE 10 MG PO TABS: 10.0000 mg | ORAL_TABLET | Freq: Four times a day (QID) | ORAL | 0 refills | Status: DC | PRN

## 2016-02-28 NOTE — Telephone Encounter (Signed)
Left detail message on voice mail. DOCTOR AWARE. May call back if needed.

## 2016-02-29 ENCOUNTER — Encounter: Payer: Self-pay | Admitting: *Deleted

## 2016-02-29 ENCOUNTER — Telehealth: Payer: Self-pay | Admitting: Medical Oncology

## 2016-02-29 ENCOUNTER — Other Ambulatory Visit: Payer: Self-pay | Admitting: Cardiology

## 2016-02-29 ENCOUNTER — Other Ambulatory Visit: Payer: Medicare Other

## 2016-02-29 ENCOUNTER — Telehealth: Payer: Self-pay | Admitting: Internal Medicine

## 2016-02-29 DIAGNOSIS — I2510 Atherosclerotic heart disease of native coronary artery without angina pectoris: Secondary | ICD-10-CM

## 2016-02-29 DIAGNOSIS — Z9861 Coronary angioplasty status: Principal | ICD-10-CM

## 2016-02-29 NOTE — Telephone Encounter (Signed)
Headaches and nausea for weeks progressively getting worse.

## 2016-02-29 NOTE — Telephone Encounter (Signed)
PATIENT STOPPED BY TODAY TO CHANGE MRI FROM 10/3 TO 9/29 DUE TO SHE HAS SO MANY APPOINTMENTS NEXT WEEK AND MRI HAS BEEN SCHEDULED ON A CHEMO DAY.   SPOKE [place] MRI FROM 10/3 TO 9/29 @ 9 PM (NIGHT) - PATIENT AWARE - GIVEN NEW DATE/TIME AND INFORMED TO ARRIVE AT 8:30 PM/

## 2016-02-29 NOTE — Telephone Encounter (Signed)
Rx request sent to pharmacy.  

## 2016-03-01 ENCOUNTER — Ambulatory Visit (HOSPITAL_COMMUNITY)
Admission: RE | Admit: 2016-03-01 | Discharge: 2016-03-01 | Disposition: A | Discharge status: Home / Self Care | Payer: Medicare Other | Source: Ambulatory Visit | Attending: Internal Medicine | Admitting: Internal Medicine

## 2016-03-01 ENCOUNTER — Other Ambulatory Visit: Payer: Self-pay

## 2016-03-01 DIAGNOSIS — C3490 Malignant neoplasm of unspecified part of unspecified bronchus or lung: Secondary | ICD-10-CM | POA: Diagnosis not present

## 2016-03-01 DIAGNOSIS — R200 Anesthesia of skin: Secondary | ICD-10-CM | POA: Diagnosis not present

## 2016-03-01 DIAGNOSIS — C3491 Malignant neoplasm of unspecified part of right bronchus or lung: Secondary | ICD-10-CM | POA: Insufficient documentation

## 2016-03-01 MED ORDER — GADOBENATE DIMEGLUMINE 529 MG/ML IV SOLN
11.0000 mL | Freq: Once | INTRAVENOUS | Status: AC | PRN
  Administered 2016-03-01: 11 mL via INTRAVENOUS

## 2016-03-01 MED ORDER — PANTOPRAZOLE SODIUM 40 MG PO TBEC: 40.0000 mg | DELAYED_RELEASE_TABLET | Freq: Every day | ORAL | 10 refills | Status: DC

## 2016-03-04 ENCOUNTER — Other Ambulatory Visit (HOSPITAL_BASED_OUTPATIENT_CLINIC_OR_DEPARTMENT_OTHER): Payer: Medicare Other

## 2016-03-04 ENCOUNTER — Ambulatory Visit (HOSPITAL_BASED_OUTPATIENT_CLINIC_OR_DEPARTMENT_OTHER): Payer: Medicare Other

## 2016-03-04 VITALS — BP 102/64 | HR 69 | Temp 98.0°F | Resp 18

## 2016-03-04 DIAGNOSIS — C3491 Malignant neoplasm of unspecified part of right bronchus or lung: Secondary | ICD-10-CM

## 2016-03-04 DIAGNOSIS — C3490 Malignant neoplasm of unspecified part of unspecified bronchus or lung: Secondary | ICD-10-CM

## 2016-03-04 DIAGNOSIS — Z23 Encounter for immunization: Secondary | ICD-10-CM

## 2016-03-04 DIAGNOSIS — Z5111 Encounter for antineoplastic chemotherapy: Secondary | ICD-10-CM

## 2016-03-04 LAB — CBC WITH DIFFERENTIAL/PLATELET
BASO%: 0.5 % (ref 0.0–2.0)
Basophils Absolute: 0 10*3/uL (ref 0.0–0.1)
EOS%: 1.6 % (ref 0.0–7.0)
Eosinophils Absolute: 0.1 10*3/uL (ref 0.0–0.5)
HCT: 41.2 % (ref 34.8–46.6)
HGB: 13.7 g/dL (ref 11.6–15.9)
LYMPH%: 13.1 % — ABNORMAL LOW (ref 14.0–49.7)
MCH: 30.2 pg (ref 25.1–34.0)
MCHC: 33.3 g/dL (ref 31.5–36.0)
MCV: 90.7 fL (ref 79.5–101.0)
MONO#: 0.4 10*3/uL (ref 0.1–0.9)
MONO%: 6.5 % (ref 0.0–14.0)
NEUT#: 4.6 10*3/uL (ref 1.5–6.5)
NEUT%: 78.3 % — ABNORMAL HIGH (ref 38.4–76.8)
Platelets: 151 10*3/uL (ref 145–400)
RBC: 4.54 10*6/uL (ref 3.70–5.45)
RDW: 13.6 % (ref 11.2–14.5)
WBC: 5.9 10*3/uL (ref 3.9–10.3)
lymph#: 0.8 10*3/uL — ABNORMAL LOW (ref 0.9–3.3)

## 2016-03-04 LAB — COMPREHENSIVE METABOLIC PANEL
ALT: <9 U/L (ref 0–55)
AST: 25 U/L (ref 5–34)
Albumin: 3.4 g/dL — ABNORMAL LOW (ref 3.5–5.0)
Alkaline Phosphatase: 61 U/L (ref 40–150)
Anion Gap: 10 mEq/L (ref 3–11)
BUN: 20.3 mg/dL (ref 7.0–26.0)
CO2: 21 mEq/L — ABNORMAL LOW (ref 22–29)
Calcium: 9.3 mg/dL (ref 8.4–10.4)
Chloride: 108 mEq/L (ref 98–109)
Creatinine: 0.6 mg/dL (ref 0.6–1.1)
EGFR: >90 mL/min/{1.73_m2} (ref >90)
Glucose: 150 mg/dl — ABNORMAL HIGH (ref 70–140)
Potassium: 4.6 mEq/L (ref 3.5–5.1)
Sodium: 140 mEq/L (ref 136–145)
Total Bilirubin: <0.3 mg/dL (ref 0.20–1.20)
Total Protein: 6.4 g/dL (ref 6.4–8.3)

## 2016-03-04 LAB — MAGNESIUM: Magnesium: 2.5 mg/dl (ref 1.5–2.5)

## 2016-03-04 MED ORDER — SODIUM CHLORIDE 0.9 % IV SOLN
Freq: Once | INTRAVENOUS | Status: AC
  Administered 2016-03-04: 11:00:00 via INTRAVENOUS

## 2016-03-04 MED ORDER — POTASSIUM CHLORIDE 2 MEQ/ML IV SOLN
Freq: Once | INTRAVENOUS | Status: AC
  Administered 2016-03-04: 10:00:00 via INTRAVENOUS
  Filled 2016-03-04: qty 10

## 2016-03-04 MED ORDER — OXYCODONE-ACETAMINOPHEN 5-325 MG PO TABS
1.0000 | ORAL_TABLET | Freq: Once | ORAL | Status: AC
  Administered 2016-03-04: 1 via ORAL

## 2016-03-04 MED ORDER — OXYCODONE-ACETAMINOPHEN 5-325 MG PO TABS
ORAL_TABLET | ORAL | Status: AC
  Filled 2016-03-04: qty 1

## 2016-03-04 MED ORDER — FOSAPREPITANT DIMEGLUMINE INJECTION 150 MG
Freq: Once | INTRAVENOUS | Status: AC
  Administered 2016-03-04: 12:00:00 via INTRAVENOUS
  Filled 2016-03-04: qty 5

## 2016-03-04 MED ORDER — PALONOSETRON HCL INJECTION 0.25 MG/5ML
INTRAVENOUS | Status: AC
  Filled 2016-03-04: qty 5

## 2016-03-04 MED ORDER — SODIUM CHLORIDE 0.9 % IV SOLN
120.0000 mg/m2 | Freq: Once | INTRAVENOUS | Status: AC
  Administered 2016-03-04: 200 mg via INTRAVENOUS
  Filled 2016-03-04: qty 10

## 2016-03-04 MED ORDER — INFLUENZA VAC SPLIT QUAD 0.5 ML IM SUSY
0.5000 mL | PREFILLED_SYRINGE | Freq: Once | INTRAMUSCULAR | Status: AC
  Administered 2016-03-04: 0.5 mL via INTRAMUSCULAR
  Filled 2016-03-04: qty 0.5

## 2016-03-04 MED ORDER — PALONOSETRON HCL INJECTION 0.25 MG/5ML
0.2500 mg | Freq: Once | INTRAVENOUS | Status: AC
  Administered 2016-03-04: 0.25 mg via INTRAVENOUS

## 2016-03-04 MED ORDER — CISPLATIN CHEMO INJECTION 100MG/100ML
61.0000 mg/m2 | Freq: Once | INTRAVENOUS | Status: AC
  Administered 2016-03-04: 100 mg via INTRAVENOUS
  Filled 2016-03-04: qty 100

## 2016-03-04 NOTE — Progress Notes (Signed)
Pt c/o pain left upper/ mid back area for one week.   Notified Dr. Julien Nordmann of pain and he instructs it is not related to pt's cancer.  Her tumor should not be causing pain.  He instructs for pt to see PCP for pain.   He gave order pt may have one percocet this morning.  Informed pt of Dr. Worthy Flank orders.  Also discussed pt can try tylenol as needed as directed on bottle for pain as needed at home.  Pt verbalized understanding.

## 2016-03-04 NOTE — Patient Instructions (Signed)
[facility] Discharge Instructions for Patients Receiving Chemotherapy  Today you received the following chemotherapy agents:  Cisplatin and Etoposide.  To help prevent nausea and vomiting after your treatment, we encourage you to take your nausea medication as directed.   If you develop nausea and vomiting that is not controlled by your nausea medication, call the clinic.   BELOW ARE SYMPTOMS THAT SHOULD BE REPORTED IMMEDIATELY:  *FEVER GREATER THAN 100.5 F  *CHILLS WITH OR WITHOUT FEVER  NAUSEA AND VOMITING THAT IS NOT CONTROLLED WITH YOUR NAUSEA MEDICATION  *UNUSUAL SHORTNESS OF BREATH  *UNUSUAL BRUISING OR BLEEDING  TENDERNESS IN MOUTH AND THROAT WITH OR WITHOUT PRESENCE OF ULCERS  *URINARY PROBLEMS  *BOWEL PROBLEMS  UNUSUAL RASH Items with * indicate a potential emergency and should be followed up as soon as possible.  Feel free to call the clinic you have any questions or concerns. The clinic phone number is [phone].  Please show the CHEMO ALERT CARD at check-in to the Emergency Department and triage nurse.   

## 2016-03-05 ENCOUNTER — Telehealth: Payer: Self-pay | Admitting: Medical Oncology

## 2016-03-05 ENCOUNTER — Ambulatory Visit (HOSPITAL_BASED_OUTPATIENT_CLINIC_OR_DEPARTMENT_OTHER): Payer: Medicare Other

## 2016-03-05 ENCOUNTER — Ambulatory Visit (HOSPITAL_COMMUNITY): Payer: Medicare Other

## 2016-03-05 VITALS — BP 125/66 | HR 67 | Temp 98.0°F | Resp 18

## 2016-03-05 DIAGNOSIS — C3490 Malignant neoplasm of unspecified part of unspecified bronchus or lung: Secondary | ICD-10-CM

## 2016-03-05 DIAGNOSIS — Z5111 Encounter for antineoplastic chemotherapy: Secondary | ICD-10-CM | POA: Diagnosis present

## 2016-03-05 DIAGNOSIS — C3491 Malignant neoplasm of unspecified part of right bronchus or lung: Secondary | ICD-10-CM

## 2016-03-05 MED ORDER — DEXAMETHASONE SODIUM PHOSPHATE 100 MG/10ML IJ SOLN
10.0000 mg | Freq: Once | INTRAMUSCULAR | Status: AC
  Administered 2016-03-05: 10 mg via INTRAVENOUS
  Filled 2016-03-05: qty 1

## 2016-03-05 MED ORDER — SODIUM CHLORIDE 0.9 % IV SOLN
Freq: Once | INTRAVENOUS | Status: AC
  Administered 2016-03-05: 13:00:00 via INTRAVENOUS

## 2016-03-05 MED ORDER — ETOPOSIDE CHEMO INJECTION 1 GM/50ML
120.0000 mg/m2 | Freq: Once | INTRAVENOUS | Status: AC
  Administered 2016-03-05: 200 mg via INTRAVENOUS
  Filled 2016-03-05: qty 10

## 2016-03-05 NOTE — Patient Instructions (Signed)
[facility] Discharge Instructions for Patients Receiving Chemotherapy  Today you received the following chemotherapy agents:  Etoposide  To help prevent nausea and vomiting after your treatment, we encourage you to take your nausea medication.   If you develop nausea and vomiting that is not controlled by your nausea medication, call the clinic.   BELOW ARE SYMPTOMS THAT SHOULD BE REPORTED IMMEDIATELY:  *FEVER GREATER THAN 100.5 F  *CHILLS WITH OR WITHOUT FEVER  NAUSEA AND VOMITING THAT IS NOT CONTROLLED WITH YOUR NAUSEA MEDICATION  *UNUSUAL SHORTNESS OF BREATH  *UNUSUAL BRUISING OR BLEEDING  TENDERNESS IN MOUTH AND THROAT WITH OR WITHOUT PRESENCE OF ULCERS  *URINARY PROBLEMS  *BOWEL PROBLEMS  UNUSUAL RASH Items with * indicate a potential emergency and should be followed up as soon as possible.  Feel free to call the clinic you have any questions or concerns. The clinic phone number is [phone].  Please show the CHEMO ALERT CARD at check-in to the Emergency Department and triage nurse.   

## 2016-03-05 NOTE — Progress Notes (Signed)
Thoracic Location of Tumor / Histology:  02/14/16 Diagnosis FINE NEEDLE ASPIRATION, ENDOSCOPIC SPECIMEN A, EBUS 7 NODE MALIGNANT CELLS PRESENT, MORPHOLOGICALLY FAVORS SMALL CELL CARCINOMA.  Patient presented to Dr. Benjamine Mola with symptoms of: She had been experiencing right sided throat discomfort since 09/2015. She was eventually diagnosed with a Vallecular Cyst. 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   Fine Needle Aspiration of  Mediastinal Adenopathy on 02/14/16 revealed: present malignant cells, favoring small cell carcinoma.    Tobacco/Marijuana/Snuff/ETOH use: She is a current smoker. She has smoked 1.5 Cigarettes daily for 40 years. She tells me she quit yesterday, and has not smoked since yesterday evening.  She has cut down her cigarette smoking since her diagnosis.   Past/Anticipated interventions by cardiothoracic surgery, if any:  02/14/16 PROCEDURE:  Procedure(s): VIDEO BRONCHOSCOPY WITH ENDOBRONCHIAL ULTRASOUND (N/A) DIRECT LARYNGOSCOPY AND BIOPSY (N/A)  SURGEON:  Surgeon(s) and Role: Panel 1:    * Grace Isaac, MD - Primary  Past/Anticipated interventions by medical oncology, if any:  02/26/16 Dr. Earlie Server: I will also refer the patient to radiation oncology for consideration of concurrent radiotherapy with systemic chemotherapy. I recommended for the patient had a regimen of systemic chemotherapy consisting of cisplatin 60 MG/M2 on day 1 and etoposide 120 MG/M2 on days 1, 2 and 3 every 3 weeks She started chemotherapy 03/04/16  Signs/Symptoms Weight changes, if any: Her weight is slightly increased today. She is concerned that she has "some fluid on board". She has lost weight over the last 6 months, and tells me she weighed about 158 lbs about 6 months ago Wt Readings from Last 3 Encounters:  03/08/16 134 lb 14.4 oz (61.2 kg)  03/01/16 125 lb (56.7 kg)  02/26/16 129 lb 8 oz (58.7 kg)      Respiratory complaints, if any: She reports a cough when her throat starts to hurt. It is non-productive at this time.   Hemoptysis, if any: She denies.   Pain issues, if any:  She reports pain in her throat. It has not improved since her throat surgery. She believes it is worse since starting chemotherapy this past Monday.   SAFETY ISSUES:  Prior radiation? No  Pacemaker/ICD? No  Possible current pregnancy? No  Is the patient on methotrexate? No  Current Complaints / other details: She is very anxious today. She discusses many symptoms she is concerned about including constipation, a rash to her mid right arm, and urinary symptoms including frequency and "pus" in her urine.  She has multiple questions regarding radiation and coordinating it with her Chemotherapy.  02/16/16 PET  IMPRESSION: Hypermetabolic thoracic lymphadenopathy, corresponding to biopsy-proven nodal metastases related to primary bronchogenic neoplasm, as above.  03/01/16 MRI brain IMPRESSION: 1. No intracranial metastatic disease or acute process is identified. 2. Stable nonspecific partially empty sella and mild parenchymal volume loss.  02/14/16 SURGEON:  Leta Baptist, MD  PREOPERATIVE DIAGNOSES: 1. Right vallecular cyst POSTOPERATIVE DIAGNOSES: 1. Right vallecular cyst PROCEDURE PERFORMED:  Direct laryngoscopy with removal of vallecular cyst  BP 133/84   Pulse 63   Temp 97.9 F (36.6 C)   Ht '5\' 5"'$  (1.651 m)   Wt 134 lb 14.4 oz (61.2 kg)   SpO2 100% Comment: room air  BMI 22.45 kg/m

## 2016-03-06 ENCOUNTER — Other Ambulatory Visit: Payer: Self-pay | Admitting: *Deleted

## 2016-03-06 ENCOUNTER — Ambulatory Visit (HOSPITAL_BASED_OUTPATIENT_CLINIC_OR_DEPARTMENT_OTHER): Payer: Medicare Other

## 2016-03-06 VITALS — BP 142/82 | HR 57 | Temp 98.8°F | Resp 18

## 2016-03-06 DIAGNOSIS — C3490 Malignant neoplasm of unspecified part of unspecified bronchus or lung: Secondary | ICD-10-CM | POA: Diagnosis not present

## 2016-03-06 DIAGNOSIS — Z5111 Encounter for antineoplastic chemotherapy: Secondary | ICD-10-CM

## 2016-03-06 DIAGNOSIS — C3491 Malignant neoplasm of unspecified part of right bronchus or lung: Secondary | ICD-10-CM

## 2016-03-06 MED ORDER — SODIUM CHLORIDE 0.9 % IV SOLN
120.0000 mg/m2 | Freq: Once | INTRAVENOUS | Status: AC
  Administered 2016-03-06: 200 mg via INTRAVENOUS
  Filled 2016-03-06: qty 10

## 2016-03-06 MED ORDER — FLUCONAZOLE 100 MG PO TABS: 100.0000 mg | ORAL_TABLET | Freq: Every day | ORAL | 0 refills | Status: DC

## 2016-03-06 MED ORDER — SODIUM CHLORIDE 0.9 % IV SOLN
10.0000 mg | Freq: Once | INTRAVENOUS | Status: AC
  Administered 2016-03-06: 10 mg via INTRAVENOUS
  Filled 2016-03-06: qty 1

## 2016-03-06 MED ORDER — SODIUM CHLORIDE 0.9 % IV SOLN
Freq: Once | INTRAVENOUS | Status: AC
  Administered 2016-03-06: 14:00:00 via INTRAVENOUS

## 2016-03-06 NOTE — Progress Notes (Signed)
Pt c/o "thrush".  States woke up this morning w/ white coating on tongue and also c/o  Mild sore throat.  Pt does have thick white coating on tongue.  Notified Dr. Julien Nordmann and he ordered Diflucan daily for 7 days.  Rx sent to pharmacy.  Instructed pt to start today and take daily as directed until complete.  She verbalized understanding.

## 2016-03-06 NOTE — Patient Instructions (Signed)
[facility] Discharge Instructions for Patients Receiving Chemotherapy  Today you received the following chemotherapy agents: Etoposide   To help prevent nausea and vomiting after your treatment, we encourage you to take your nausea medication as directed.    If you develop nausea and vomiting that is not controlled by your nausea medication, call the clinic.   BELOW ARE SYMPTOMS THAT SHOULD BE REPORTED IMMEDIATELY:  *FEVER GREATER THAN 100.5 F  *CHILLS WITH OR WITHOUT FEVER  NAUSEA AND VOMITING THAT IS NOT CONTROLLED WITH YOUR NAUSEA MEDICATION  *UNUSUAL SHORTNESS OF BREATH  *UNUSUAL BRUISING OR BLEEDING  TENDERNESS IN MOUTH AND THROAT WITH OR WITHOUT PRESENCE OF ULCERS  *URINARY PROBLEMS  *BOWEL PROBLEMS  UNUSUAL RASH Items with * indicate a potential emergency and should be followed up as soon as possible.  Feel free to call the clinic you have any questions or concerns. The clinic phone number is [phone].  Please show the CHEMO ALERT CARD at check-in to the Emergency Department and triage nurse.   
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
## 2016-03-08 ENCOUNTER — Ambulatory Visit
Admission: RE | Admit: 2016-03-08 | Discharge: 2016-03-08 | Disposition: A | Discharge status: Home / Self Care | Payer: Medicare Other | Source: Ambulatory Visit | Attending: Radiation Oncology | Admitting: Radiation Oncology

## 2016-03-08 ENCOUNTER — Encounter: Payer: Self-pay | Admitting: Radiation Oncology

## 2016-03-08 ENCOUNTER — Ambulatory Visit (HOSPITAL_BASED_OUTPATIENT_CLINIC_OR_DEPARTMENT_OTHER): Payer: Medicare Other

## 2016-03-08 VITALS — BP 141/64 | HR 69 | Temp 97.6°F | Resp 20

## 2016-03-08 VITALS — BP 133/84 | HR 63 | Temp 97.9°F | Ht 65.0 in | Wt 134.9 lb

## 2016-03-08 DIAGNOSIS — N189 Chronic kidney disease, unspecified: Secondary | ICD-10-CM | POA: Diagnosis not present

## 2016-03-08 DIAGNOSIS — Z833 Family history of diabetes mellitus: Secondary | ICD-10-CM | POA: Insufficient documentation

## 2016-03-08 DIAGNOSIS — R42 Dizziness and giddiness: Secondary | ICD-10-CM | POA: Insufficient documentation

## 2016-03-08 DIAGNOSIS — I129 Hypertensive chronic kidney disease with stage 1 through stage 4 chronic kidney disease, or unspecified chronic kidney disease: Secondary | ICD-10-CM | POA: Insufficient documentation

## 2016-03-08 DIAGNOSIS — I2510 Atherosclerotic heart disease of native coronary artery without angina pectoris: Secondary | ICD-10-CM | POA: Insufficient documentation

## 2016-03-08 DIAGNOSIS — Z9889 Other specified postprocedural states: Secondary | ICD-10-CM | POA: Diagnosis not present

## 2016-03-08 DIAGNOSIS — F17210 Nicotine dependence, cigarettes, uncomplicated: Secondary | ICD-10-CM | POA: Diagnosis not present

## 2016-03-08 DIAGNOSIS — R05 Cough: Secondary | ICD-10-CM | POA: Insufficient documentation

## 2016-03-08 DIAGNOSIS — Z8051 Family history of malignant neoplasm of kidney: Secondary | ICD-10-CM | POA: Diagnosis not present

## 2016-03-08 DIAGNOSIS — Z823 Family history of stroke: Secondary | ICD-10-CM | POA: Insufficient documentation

## 2016-03-08 DIAGNOSIS — Z5189 Encounter for other specified aftercare: Secondary | ICD-10-CM

## 2016-03-08 DIAGNOSIS — Z8249 Family history of ischemic heart disease and other diseases of the circulatory system: Secondary | ICD-10-CM | POA: Diagnosis not present

## 2016-03-08 DIAGNOSIS — Z9221 Personal history of antineoplastic chemotherapy: Secondary | ICD-10-CM | POA: Insufficient documentation

## 2016-03-08 DIAGNOSIS — Z800 Family history of malignant neoplasm of digestive organs: Secondary | ICD-10-CM | POA: Insufficient documentation

## 2016-03-08 DIAGNOSIS — Z510 Encounter for antineoplastic radiation therapy: Secondary | ICD-10-CM | POA: Diagnosis not present

## 2016-03-08 DIAGNOSIS — R070 Pain in throat: Secondary | ICD-10-CM | POA: Diagnosis not present

## 2016-03-08 DIAGNOSIS — C3491 Malignant neoplasm of unspecified part of right bronchus or lung: Secondary | ICD-10-CM | POA: Diagnosis not present

## 2016-03-08 DIAGNOSIS — Z888 Allergy status to other drugs, medicaments and biological substances status: Secondary | ICD-10-CM | POA: Diagnosis not present

## 2016-03-08 DIAGNOSIS — M5410 Radiculopathy, site unspecified: Secondary | ICD-10-CM | POA: Insufficient documentation

## 2016-03-08 DIAGNOSIS — F419 Anxiety disorder, unspecified: Secondary | ICD-10-CM | POA: Insufficient documentation

## 2016-03-08 DIAGNOSIS — J449 Chronic obstructive pulmonary disease, unspecified: Secondary | ICD-10-CM | POA: Insufficient documentation

## 2016-03-08 DIAGNOSIS — I252 Old myocardial infarction: Secondary | ICD-10-CM | POA: Diagnosis not present

## 2016-03-08 DIAGNOSIS — M1990 Unspecified osteoarthritis, unspecified site: Secondary | ICD-10-CM | POA: Insufficient documentation

## 2016-03-08 DIAGNOSIS — Z79899 Other long term (current) drug therapy: Secondary | ICD-10-CM | POA: Insufficient documentation

## 2016-03-08 DIAGNOSIS — C771 Secondary and unspecified malignant neoplasm of intrathoracic lymph nodes: Secondary | ICD-10-CM

## 2016-03-08 DIAGNOSIS — Z825 Family history of asthma and other chronic lower respiratory diseases: Secondary | ICD-10-CM | POA: Insufficient documentation

## 2016-03-08 DIAGNOSIS — C3490 Malignant neoplasm of unspecified part of unspecified bronchus or lung: Secondary | ICD-10-CM

## 2016-03-08 DIAGNOSIS — M479 Spondylosis, unspecified: Secondary | ICD-10-CM | POA: Diagnosis not present

## 2016-03-08 DIAGNOSIS — Z955 Presence of coronary angioplasty implant and graft: Secondary | ICD-10-CM | POA: Diagnosis not present

## 2016-03-08 LAB — URINALYSIS, MICROSCOPIC - CHCC
Bilirubin (Urine): NEGATIVE
Blood: NEGATIVE
Glucose: NEGATIVE mg/dL
Ketones: NEGATIVE mg/dL
Leukocyte Esterase: NEGATIVE
Nitrite: NEGATIVE
Protein: NEGATIVE mg/dL
RBC / HPF: NEGATIVE (ref 0–2)
Specific Gravity, Urine: 1.005 (ref 1.003–1.035)
Urobilinogen, UR: 0.2 mg/dL (ref 0.2–1)
pH: 6 (ref 4.6–8.0)

## 2016-03-08 MED ORDER — PEGFILGRASTIM INJECTION 6 MG/0.6ML ~~LOC~~
6.0000 mg | PREFILLED_SYRINGE | Freq: Once | SUBCUTANEOUS | Status: AC
  Administered 2016-03-08: 6 mg via SUBCUTANEOUS
  Filled 2016-03-08: qty 0.6

## 2016-03-08 NOTE — Patient Instructions (Signed)
Pegfilgrastim injection What is this medicine? PEGFILGRASTIM (PEG fil gra stim) is a long-acting granulocyte colony-stimulating factor that stimulates the growth of neutrophils, a type of white blood cell important in the body's fight against infection. It is used to reduce the incidence of fever and infection in patients with certain types of cancer who are receiving chemotherapy that affects the bone marrow, and to increase survival after being exposed to high doses of radiation. This medicine may be used for other purposes; ask your health care provider or pharmacist if you have questions. What should I tell my health care provider before I take this medicine? They need to know if you have any of these conditions: -kidney disease -latex allergy -ongoing radiation therapy -sickle cell disease -skin reactions to acrylic adhesives (On-Body Injector only) -an unusual or allergic reaction to pegfilgrastim, filgrastim, other medicines, foods, dyes, or preservatives -pregnant or trying to get pregnant -breast-feeding How should I use this medicine? This medicine is for injection under the skin. If you get this medicine at home, you will be taught how to prepare and give the pre-filled syringe or how to use the On-body Injector. Refer to the patient Instructions for Use for detailed instructions. Use exactly as directed. Take your medicine at regular intervals. Do not take your medicine more often than directed. It is important that you put your used needles and syringes in a special sharps container. Do not put them in a trash can. If you do not have a sharps container, call your pharmacist or healthcare provider to get one. Talk to your pediatrician regarding the use of this medicine in children. While this drug may be prescribed for selected conditions, precautions do apply. Overdosage: If you think you have taken too much of this medicine contact a poison control center or emergency room at  once. NOTE: This medicine is only for you. Do not share this medicine with others. What if I miss a dose? It is important not to miss your dose. Call your doctor or health care professional if you miss your dose. If you miss a dose due to an On-body Injector failure or leakage, a new dose should be administered as soon as possible using a single prefilled syringe for manual use. What may interact with this medicine? Interactions have not been studied. Give your health care provider a list of all the medicines, herbs, non-prescription drugs, or dietary supplements you use. Also tell them if you smoke, drink alcohol, or use illegal drugs. Some items may interact with your medicine. This list may not describe all possible interactions. Give your health care provider a list of all the medicines, herbs, non-prescription drugs, or dietary supplements you use. Also tell them if you smoke, drink alcohol, or use illegal drugs. Some items may interact with your medicine. What should I watch for while using this medicine? You may need blood work done while you are taking this medicine. If you are going to need a MRI, CT scan, or other procedure, tell your doctor that you are using this medicine (On-Body Injector only). What side effects may I notice from receiving this medicine? Side effects that you should report to your doctor or health care professional as soon as possible: -allergic reactions like skin rash, itching or hives, swelling of the face, lips, or tongue -dizziness -fever -pain, redness, or irritation at site where injected -pinpoint red spots on the skin -red or dark-brown urine -shortness of breath or breathing problems -stomach or side pain, or pain   at the shoulder -swelling -tiredness -trouble passing urine or change in the amount of urine Side effects that usually do not require medical attention (report to your doctor or health care professional if they continue or are  bothersome): -bone pain -muscle pain This list may not describe all possible side effects. Call your doctor for medical advice about side effects. You may report side effects to FDA at 1-800-FDA-1088. Where should I keep my medicine? Keep out of the reach of children. Store pre-filled syringes in a refrigerator between 2 and 8 degrees C (36 and 46 degrees F). Do not freeze. Keep in carton to protect from light. Throw away this medicine if it is left out of the refrigerator for more than 48 hours. Throw away any unused medicine after the expiration date. NOTE: This sheet is a summary. It may not cover all possible information. If you have questions about this medicine, talk to your doctor, pharmacist, or health care provider.    2016, Elsevier/Gold Standard. (2014-06-09 14:30:14)  

## 2016-03-09 ENCOUNTER — Other Ambulatory Visit: Payer: Self-pay

## 2016-03-09 ENCOUNTER — Emergency Department (HOSPITAL_COMMUNITY): Payer: Medicare Other

## 2016-03-09 ENCOUNTER — Encounter (HOSPITAL_COMMUNITY): Payer: Self-pay

## 2016-03-09 ENCOUNTER — Emergency Department (HOSPITAL_COMMUNITY)
Admission: EM | Admit: 2016-03-09 | Discharge: 2016-03-09 | Disposition: A | Discharge status: Home / Self Care | Payer: Medicare Other | Attending: Emergency Medicine | Admitting: Emergency Medicine

## 2016-03-09 DIAGNOSIS — K5900 Constipation, unspecified: Secondary | ICD-10-CM

## 2016-03-09 DIAGNOSIS — J439 Emphysema, unspecified: Secondary | ICD-10-CM | POA: Diagnosis not present

## 2016-03-09 DIAGNOSIS — N189 Chronic kidney disease, unspecified: Secondary | ICD-10-CM | POA: Insufficient documentation

## 2016-03-09 DIAGNOSIS — Z79899 Other long term (current) drug therapy: Secondary | ICD-10-CM | POA: Diagnosis not present

## 2016-03-09 DIAGNOSIS — I2510 Atherosclerotic heart disease of native coronary artery without angina pectoris: Secondary | ICD-10-CM | POA: Insufficient documentation

## 2016-03-09 DIAGNOSIS — Z87891 Personal history of nicotine dependence: Secondary | ICD-10-CM | POA: Diagnosis not present

## 2016-03-09 DIAGNOSIS — R1032 Left lower quadrant pain: Secondary | ICD-10-CM | POA: Insufficient documentation

## 2016-03-09 DIAGNOSIS — Z85118 Personal history of other malignant neoplasm of bronchus and lung: Secondary | ICD-10-CM | POA: Diagnosis not present

## 2016-03-09 DIAGNOSIS — R1012 Left upper quadrant pain: Secondary | ICD-10-CM | POA: Diagnosis not present

## 2016-03-09 DIAGNOSIS — I129 Hypertensive chronic kidney disease with stage 1 through stage 4 chronic kidney disease, or unspecified chronic kidney disease: Secondary | ICD-10-CM | POA: Diagnosis not present

## 2016-03-09 DIAGNOSIS — J45909 Unspecified asthma, uncomplicated: Secondary | ICD-10-CM | POA: Insufficient documentation

## 2016-03-09 DIAGNOSIS — Z9861 Coronary angioplasty status: Secondary | ICD-10-CM | POA: Insufficient documentation

## 2016-03-09 DIAGNOSIS — R1010 Upper abdominal pain, unspecified: Secondary | ICD-10-CM | POA: Diagnosis not present

## 2016-03-09 DIAGNOSIS — J449 Chronic obstructive pulmonary disease, unspecified: Secondary | ICD-10-CM | POA: Diagnosis not present

## 2016-03-09 LAB — COMPREHENSIVE METABOLIC PANEL
ALT: 19 U/L (ref 14–54)
AST: 33 U/L (ref 15–41)
Albumin: 4.1 g/dL (ref 3.5–5.0)
Alkaline Phosphatase: 51 U/L (ref 38–126)
Anion gap: 5 (ref 5–15)
BUN: 15 mg/dL (ref 6–20)
CO2: 29 mmol/L (ref 22–32)
Calcium: 9.8 mg/dL (ref 8.9–10.3)
Chloride: 103 mmol/L (ref 101–111)
Creatinine, Ser: 0.48 mg/dL (ref 0.44–1.00)
GFR calc Af Amer: >60 mL/min (ref >60)
GFR calc non Af Amer: >60 mL/min (ref >60)
Glucose, Bld: 126 mg/dL — ABNORMAL HIGH (ref 65–99)
Potassium: 3.9 mmol/L (ref 3.5–5.1)
Sodium: 137 mmol/L (ref 135–145)
Total Bilirubin: 1.1 mg/dL (ref 0.3–1.2)
Total Protein: 6.7 g/dL (ref 6.5–8.1)

## 2016-03-09 LAB — URINALYSIS, ROUTINE W REFLEX MICROSCOPIC
Bilirubin Urine: NEGATIVE
Glucose, UA: NEGATIVE mg/dL
Hgb urine dipstick: NEGATIVE
Ketones, ur: NEGATIVE mg/dL
Leukocytes, UA: NEGATIVE
Nitrite: NEGATIVE
Protein, ur: NEGATIVE mg/dL
Specific Gravity, Urine: 1.005 (ref 1.005–1.030)
pH: 6.5 (ref 5.0–8.0)

## 2016-03-09 LAB — POC OCCULT BLOOD, ED: Fecal Occult Bld: NEGATIVE

## 2016-03-09 LAB — TYPE AND SCREEN
ABO/RH(D): B NEG
Antibody Screen: NEGATIVE

## 2016-03-09 LAB — AMMONIA: Ammonia: 14 umol/L (ref 9–35)

## 2016-03-09 LAB — CBC
HCT: 41 % (ref 36.0–46.0)
Hemoglobin: 14.1 g/dL (ref 12.0–15.0)
MCH: 30.5 pg (ref 26.0–34.0)
MCHC: 34.4 g/dL (ref 30.0–36.0)
MCV: 88.6 fL (ref 78.0–100.0)
Platelets: 138 10*3/uL — ABNORMAL LOW (ref 150–400)
RBC: 4.63 MIL/uL (ref 3.87–5.11)
RDW: 13.2 % (ref 11.5–15.5)
WBC: 23 10*3/uL — ABNORMAL HIGH (ref 4.0–10.5)

## 2016-03-09 LAB — I-STAT TROPONIN, ED: Troponin i, poc: 0 ng/mL (ref 0.00–0.08)

## 2016-03-09 LAB — LIPASE, BLOOD: Lipase: 26 U/L (ref 11–51)

## 2016-03-09 LAB — I-STAT CG4 LACTIC ACID, ED: Lactic Acid, Venous: 0.78 mmol/L (ref 0.5–1.9)

## 2016-03-09 LAB — PROTIME-INR
INR: 0.98
Prothrombin Time: 13 seconds (ref 11.4–15.2)

## 2016-03-09 LAB — TROPONIN I: Troponin I: <0.03 ng/mL (ref <0.03)

## 2016-03-09 LAB — ABO/RH: ABO/RH(D): B NEG

## 2016-03-09 MED ORDER — HYDROCODONE-ACETAMINOPHEN 5-325 MG PO TABS
2.0000 | ORAL_TABLET | Freq: Once | ORAL | Status: AC
  Administered 2016-03-09: 2 via ORAL
  Filled 2016-03-09: qty 2

## 2016-03-09 MED ORDER — AMOXICILLIN-POT CLAVULANATE 875-125 MG PO TABS: 1.0000 | ORAL_TABLET | Freq: Two times a day (BID) | ORAL | 0 refills | Status: DC

## 2016-03-09 MED ORDER — HYDROCODONE-ACETAMINOPHEN 5-325 MG PO TABS: 1.0000 | ORAL_TABLET | Freq: Four times a day (QID) | ORAL | 0 refills | Status: DC | PRN

## 2016-03-09 MED ORDER — IOPAMIDOL (ISOVUE-300) INJECTION 61%
100.0000 mL | Freq: Once | INTRAVENOUS | Status: AC | PRN
  Administered 2016-03-09: 100 mL via INTRAVENOUS

## 2016-03-09 MED ORDER — AMOXICILLIN-POT CLAVULANATE 875-125 MG PO TABS
1.0000 | ORAL_TABLET | Freq: Two times a day (BID) | ORAL | Status: DC
  Administered 2016-03-09: 1 via ORAL
  Filled 2016-03-09: qty 1

## 2016-03-09 MED ORDER — POLYETHYLENE GLYCOL 3350 17 G PO PACK: 17.0000 g | PACK | Freq: Every day | ORAL | 0 refills | Status: DC

## 2016-03-09 MED ORDER — SODIUM CHLORIDE 0.9 % IV BOLUS (SEPSIS)
500.0000 mL | Freq: Once | INTRAVENOUS | Status: AC
  Administered 2016-03-09: 500 mL via INTRAVENOUS

## 2016-03-09 NOTE — Discharge Instructions (Addendum)
Take Tylenol for mild pain or the pain medicine prescribed for bad pain. Don't take Tylenol together with the pain medicine dictation prescribed as the combination can be dangerous your liver. Call Dr.Mohamad's office if continued to feel badly in 2 days. Return if your condition worsens for any reason.  MiraLAX is an osmotic laxative. This means that it will keep electrolytes and water in your stool and allow you to have easier bowel movements. You  can take this medication up to 3 times daily as needed produce bowel movements. However while you're taking this much MiraLAX you need to make sure you are replenishing her electrolytes and water loss. He should also not take it more than 2 days in a row at this level. Generally I recommend people workup to this. For example take 1 capful of MiraLAX once a day for 2-3 days and if you do not get improvement in your bowel habits then take 1 capful twice a day for 2-3 days and if they don't get relief from this and you can take 1 capful 3 times a day for 2 days. If you start having diarrhea, decrease the amount of MiraLAX you are using until you have soft formed stools once to twice a day. Remember during this process that you need to increase your electrolytes and water intake, so oftentimes sports drinks are good for this. Some people end up taking half or one whole capful daily for the rest of her lives to have regular bowel movements you can do this as you feel is proper.

## 2016-03-09 NOTE — ED Triage Notes (Signed)
PT C/O UPPER ABDOMINAL PAIN X2 DAYS. PT STS SHE HAD BLOOD ON HER TISSUE AFTER A BOWEL MOVEMENT TODAY, BUT NO BLOOD IN THE TOILET. PT HAS A HX OF LUNG CA, AND IS S/P CHEMO MON, TUE, AND WED, WITH A NEUPOGEN INJECTION ON Friday. PT TOOK MAG CITRATE YESTERDAY, BUT ONLY HAD A SMALL BM TODAY. DENIES FEVER, N/V/D.

## 2016-03-09 NOTE — ED Notes (Signed)
Pt doesn't want to be stuck.  Will let me attempt -  Talked to RN -  Will wait on collecting second set of cultures until the repeat lactic is ordered.
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
## 2016-03-09 NOTE — ED Notes (Signed)
Pt stated she wants the IV team to draw blood and start IV

## 2016-03-09 NOTE — ED Provider Notes (Addendum)
Complains of diffuse myalgias and epigastric pain for several days after having received chemotherapy and Neulasta injection. She reports that she had blood on toilet paper earlier this morning. She is also currently being treated for oral thrush. On exam patient is alert nontoxic HEENT exam mucous membranes moist. Oral thrush. Neck supple. Lungs clear auscultation abdomen minimal tenderness at epigastrium no guarding rigidity or rebound. All 4 extremities without redness swelling or tenderness neurovascularly intact.  Prescription for Norco. We will not provide prescriptions for MiraLAX or Augmentin as there is no signs of infection and patient has MiraLAX at home Results for orders placed or performed during the hospital encounter of 03/09/16  Comprehensive metabolic panel  Result Value Ref Range   Sodium 137 135 - 145 mmol/L   Potassium 3.9 3.5 - 5.1 mmol/L   Chloride 103 101 - 111 mmol/L   CO2 29 22 - 32 mmol/L   Glucose, Bld 126 (H) 65 - 99 mg/dL   BUN 15 6 - 20 mg/dL   Creatinine, Ser 0.48 0.44 - 1.00 mg/dL   Calcium 9.8 8.9 - 10.3 mg/dL   Total Protein 6.7 6.5 - 8.1 g/dL   Albumin 4.1 3.5 - 5.0 g/dL   AST 33 15 - 41 U/L   ALT 19 14 - 54 U/L   Alkaline Phosphatase 51 38 - 126 U/L   Total Bilirubin 1.1 0.3 - 1.2 mg/dL   GFR calc non Af Amer >60 >60 mL/min   GFR calc Af Amer >60 >60 mL/min   Anion gap 5 5 - 15  CBC  Result Value Ref Range   WBC 23.0 (H) 4.0 - 10.5 K/uL   RBC 4.63 3.87 - 5.11 MIL/uL   Hemoglobin 14.1 12.0 - 15.0 g/dL   HCT 41.0 36.0 - 46.0 %   MCV 88.6 78.0 - 100.0 fL   MCH 30.5 26.0 - 34.0 pg   MCHC 34.4 30.0 - 36.0 g/dL   RDW 13.2 11.5 - 15.5 %   Platelets 138 (L) 150 - 400 K/uL  Protime-INR - (order if Patient is taking Coumadin / Warfarin)  Result Value Ref Range   Prothrombin Time 13.0 11.4 - 15.2 seconds   INR 0.98   Urinalysis, Routine w reflex microscopic (not at [facility])  Result Value Ref Range   Color, Urine YELLOW YELLOW   APPearance CLEAR CLEAR    Specific Gravity, Urine 1.005 1.005 - 1.030   pH 6.5 5.0 - 8.0   Glucose, UA NEGATIVE NEGATIVE mg/dL   Hgb urine dipstick NEGATIVE NEGATIVE   Bilirubin Urine NEGATIVE NEGATIVE   Ketones, ur NEGATIVE NEGATIVE mg/dL   Protein, ur NEGATIVE NEGATIVE mg/dL   Nitrite NEGATIVE NEGATIVE   Leukocytes, UA NEGATIVE NEGATIVE  Lipase, blood  Result Value Ref Range   Lipase 26 11 - 51 U/L  Troponin I  Result Value Ref Range   Troponin I <0.03 <0.03 ng/mL  Ammonia  Result Value Ref Range   Ammonia 14 9 - 35 umol/L  POC occult blood, ED  Result Value Ref Range   Fecal Occult Bld NEGATIVE NEGATIVE  I-stat troponin, ED  Result Value Ref Range   Troponin i, poc 0.00 0.00 - 0.08 ng/mL   Comment 3          I-Stat CG4 Lactic Acid, ED  (not at  [facility])  Result Value Ref Range   Lactic Acid, Venous 0.78 0.5 - 1.9 mmol/L  Type and screen [place]  Result Value Ref Range  ABO/RH(D) B NEG    Antibody Screen NEG    Sample Expiration 03/12/2016    Dg Chest 2 View  Result Date: 03/09/2016 CLINICAL DATA:  Weakness and history of lung carcinoma. EXAM: CHEST  2 VIEW COMPARISON:  02/14/2016 FINDINGS: The heart size and mediastinal contours are within normal limits. Stable advanced emphysematous lung disease. Stable scarring at the right lung base. There is no evidence of pulmonary edema, consolidation, pneumothorax, nodule or pleural fluid. The visualized skeletal structures are unremarkable. IMPRESSION: Stable emphysema.  No acute findings. Electronically Signed   By: Aletta Edouard M.D.   On: 03/09/2016 13:27   Dg Chest 2 View  Result Date: 02/14/2016 CLINICAL DATA:  Mediastinal adenopathy.  Preop. EXAM: CHEST  2 VIEW COMPARISON:  02/07/2016 FINDINGS: Known right paratracheal adenopathy. Normal heart size and aortic contours. Coronary stenting. Emphysema with bullous changes greater on the right. There is no edema, consolidation, effusion, or pneumothorax. No acute osseous finding.  IMPRESSION: 1. No evidence of active disease. 2. Known peritracheal adenopathy. Electronically Signed   By: Monte Fantasia M.D.   On: 02/14/2016 07:15   Ct Chest W Contrast  Result Date: 02/09/2016 CLINICAL DATA:  Throat mass EXAM: CT CHEST WITH CONTRAST TECHNIQUE: Multidetector CT imaging of the chest was performed during intravenous contrast administration. CONTRAST:  75 cc Isovue high COMPARISON:  Chest x-ray 02/07/2016 FINDINGS: Cardiovascular: Atherosclerotic calcifications of thoracic aorta. No aortic aneurysm. Heart size within normal limits. Atherosclerotic calcifications of coronary arteries. Mediastinum/Nodes: There is a precarinal pathologic lymph node with necrotic center measures at least 3.6 by 2.9 cm. A second right pretracheal lymph node Measures 2.3 cm in short-axis. These are highly suspicious for lymphoproliferative disease or metastatic disease. Correlation with PET scan is recommended. A AP window lymph node measures 1.4 cm short-axis. Lungs/Pleura: Images of the lung parenchyma shows bilateral extensive emphysematous/ bullous changes. No acute infiltrate or pulmonary edema. No pleural thickening. No pleural effusion. Upper Abdomen: Small hiatal hernia is noted. The visualized upper abdomen shows no adrenal gland mass. Visualized liver, spleen and pancreas is unremarkable. The kidneys are symmetrical in size. No hydronephrosis or hydroureter. No calcified gallstones are noted within gallbladder. Musculoskeletal: Sagittal images of the spine shows mild degenerative of the the the the. IMPRESSION: 1. There is precarinal and pretracheal pathologic adenopathy. Primary or secondary malignancy cannot be excluded. Correlation with PET scan and/or biopsy is recommended. 2. No acute infiltrate or pulmonary edema. Emphysematous changes are noted bilaterally. 3. Degenerative changes thoracic spine. 4. No adrenal gland mass is noted.  Small hiatal hernia. Electronically Signed   By: Lahoma Crocker M.D.    On: 02/09/2016 21:40   Mr Jeri Cos QP Contrast  Result Date: 03/02/2016 CLINICAL DATA:  60 y/o F; small cell lung cancer with 3 months of pain, nausea, numbness, and weakness. EXAM: MRI HEAD WITHOUT AND WITH CONTRAST TECHNIQUE: Multiplanar, multiecho pulse sequences of the brain and surrounding structures were obtained without and with intravenous contrast. CONTRAST:  45m MULTIHANCE GADOBENATE DIMEGLUMINE 529 MG/ML IV SOLN COMPARISON:  12/31/2015 MRI of the brain FINDINGS: Brain: No acute infarction, hemorrhage, hydrocephalus, extra-axial collection or mass lesion. No abnormal enhancement. Partially empty sella again noted. No significant T2 FLAIR signal abnormality of the brain. Mild stable parenchymal volume loss. Vascular: Normal flow voids. Skull and upper cervical spine: Normal marrow signal. Sinuses/Orbits: Negative. Other: None. IMPRESSION: 1. No intracranial metastatic disease or acute process is identified. 2. Stable nonspecific partially empty sella and mild parenchymal volume loss. Electronically Signed  By: Kristine Garbe M.D.   On: 03/02/2016 03:55   Ct Abdomen Pelvis W Contrast  Result Date: 03/09/2016 CLINICAL DATA:  Two day history of upper abdominal pain. History of lung cancer. EXAM: CT ABDOMEN AND PELVIS WITH CONTRAST TECHNIQUE: Multidetector CT imaging of the abdomen and pelvis was performed using the standard protocol following bolus administration of intravenous contrast. CONTRAST:  158m ISOVUE-300 IOPAMIDOL (ISOVUE-300) INJECTION 61% COMPARISON:  CT scan 11/17/2015 and PET-CT 02/16/2016 FINDINGS: Lower chest: Stable emphysematous changes and peripheral pulmonary scarring. No acute pulmonary findings or worrisome pulmonary nodules. Stable three-vessel coronary artery calcifications. No pericardial effusion. Moderate-sized hiatal hernia. Hepatobiliary: No focal hepatic lesions to suggest metastatic disease. The gallbladder is normal. No common bile duct dilatation. Pancreas:  No mass, inflammation or ductal dilatation. Spleen: Normal size.  No focal lesions. Adrenals/Urinary Tract: Stable nodularity of the left adrenal gland. The right adrenal gland is normal. Stable small right renal cyst. No worrisome renal lesions. Stomach/Bowel: The stomach, duodenum, small bowel and colon are unremarkable. No inflammatory changes, mass lesions or obstructive findings. The terminal ileum is normal. The appendix is normal. Vascular/Lymphatic: Stable atherosclerotic calcifications involving the aorta and iliac arteries. The branch vessels are patent. Stable area of focal adherent clot involving the infrarenal aorta. No mesenteric or retroperitoneal mass or adenopathy. Reproductive: The uterus and ovaries are normal. Other: No pelvic mass or adenopathy. No free pelvic fluid collections. No inguinal mass or adenopathy. No abdominal wall hernia or subcutaneous lesions. Musculoskeletal: No significant bony findings. Stable degenerative changes involving the spine no worrisome bone lesions to suggest metastatic disease. IMPRESSION: 1. No acute abdominal/pelvic findings, mass lesions or adenopathy. 2. Stable age advanced atherosclerotic calcifications involving the aorta and iliac arteries 3. Stable age advanced three-vessel coronary artery calcifications. 4. Stable emphysematous changes noted at the lung base but no worrisome pulmonary nodules. Electronically Signed   By: PMarijo SanesM.D.   On: 03/09/2016 16:35   Nm Pet Image Initial (pi) Skull Base To Thigh  Result Date: 02/16/2016 CLINICAL DATA:  Initial treatment strategy for lung cancer. EXAM: NUCLEAR MEDICINE PET SKULL BASE TO THIGH TECHNIQUE: 12.35 mCi F-18 FDG was injected intravenously. Full-ring PET imaging was performed from the skull base to thigh after the radiotracer. CT data was obtained and used for attenuation correction and anatomic localization. FASTING BLOOD GLUCOSE:  Value: 108 mg/dl COMPARISON:  CT chest dated 02/09/2016. CT  abdomen pelvis dated 11/17/2015. FINDINGS: NECK No hypermetabolic lymph nodes in the neck. Hypermetabolism along the right base of tongue/vallecula, max SUV 6.3, likely related to recent biopsy/ procedure. CHEST Hypermetabolic thoracic lymphadenopathy, including: --1.2 cm short axis high right paratracheal node (series 3/ image 68), max SUV 4.8 --2.7 cm short axis right paratracheal node (series 3/ image 82), max SUV 8.6 --2.6 cm short axis low right paratracheal node (series 3/ image 88), max SUV 17.3) Moderate centrilobular and paraseptal emphysematous changes, upper lobe predominant. No suspicious pulmonary nodules. Mild dependent atelectasis in the bilateral lower lobes. No pleural effusion or pneumothorax. The heart is normal in size. No pericardial effusion. Three vessel coronary atherosclerosis. Mild atherosclerotic calcifications of the aortic arch. ABDOMEN/PELVIS No abnormal hypermetabolic activity within the liver, pancreas, adrenal glands, or spleen. Atherosclerotic calcifications the abdominal aorta and branch vessels. No hypermetabolic lymph nodes in the abdomen or pelvis. SKELETON No focal hypermetabolic activity to suggest skeletal metastasis. IMPRESSION: Hypermetabolic thoracic lymphadenopathy, corresponding to biopsy-proven nodal metastases related to primary bronchogenic neoplasm, as above. Electronically Signed   By: SHenderson NewcomerD.  On: 02/16/2016 12:13  Chest x-ray viewed by me. I spoke with Dr. Burr Medico from oncology service. Leukocytosis likely secondary to Neulasta injections. Symptoms are likely related to Neulasta as well. Plan prescription Norco. She has MiraLAX at home which she can take for constipation. She is to follow-up at oncology office in 2 days if having significant symptoms.   Orlie Dakin, MD 03/09/16 [place], MD 03/09/16 1750

## 2016-03-09 NOTE — ED Notes (Addendum)
Pt states she is a very hard stick - would like labs/IV done at the same time.

## 2016-03-10 LAB — URINE CULTURE

## 2016-03-11 ENCOUNTER — Encounter: Payer: Self-pay | Admitting: *Deleted

## 2016-03-11 ENCOUNTER — Ambulatory Visit: Payer: Medicare Other | Admitting: Oncology

## 2016-03-11 ENCOUNTER — Telehealth: Payer: Self-pay | Admitting: *Deleted

## 2016-03-11 ENCOUNTER — Telehealth: Payer: Self-pay

## 2016-03-11 ENCOUNTER — Other Ambulatory Visit: Payer: Medicare Other

## 2016-03-11 NOTE — Telephone Encounter (Signed)
I called and left a voicemail message with Michele [name] informing her that her recent UA was free of infection. I left my number if she has any further questions.

## 2016-03-11 NOTE — Progress Notes (Signed)
Does patient have an allergy to IV contrast dye?: No.   Has patient ever received premedication for IV contrast dye?: No.   Does patient take metformin?: No.  If patient does take metformin when was the last dose: N/A  Date of lab work: March 09, 2016 BUN: 15 CR: .48  IV site: Left Anticubital.   BP 109/68   Pulse 77   Temp 98.4 F (36.9 C)   Ht '5\' 5"'$  (1.651 m)   Wt 129 lb (58.5 kg)   SpO2 99% Comment: room air  BMI 21.47 kg/m    Wt Readings from Last 3 Encounters:  03/12/16 129 lb (58.5 kg)  03/09/16 134 lb (60.8 kg)  03/08/16 134 lb 14.4 oz (61.2 kg)

## 2016-03-11 NOTE — Progress Notes (Signed)
[place] Psychosocial Distress Screening Clinical Social Work  Clinical Social Work was referred by distress screening protocol.  The patient scored a 5 on the Psychosocial Distress Thermometer which indicates moderate distress. Clinical Social Worker contacted patient by phone to assess for distress and other psychosocial needs.  Ms. Semrad shared feelings of anxiety regarding the amount of appointments and scheduling process.  CSW listened to patient's concerns, validated feelings of anxiety and being overwhelmed, and discussed cancer center treatment process in depth.  CSW and patient discussed anxiety developing from not feeling completely informed about process and not "having control" of process.  CSW encouraged patient to talk further with radiation therapists after simulation where she will be provided full calendar of treatments.   Ms. Cimini shared she is feeling fatigue, weakness, and "fogginess"- plans to discuss at next medical oncology visit.  She shared she is also concerned with weight loss, doesn't feel she can afford to eat the "right foods"- CSW will route note to dietitian for nutrition support. Patient's boyfriend/roommate and brother have been providing transportation- patient reports she drives but has felt "too sick to drive".  CSW reviewed information for [place] to Recovery program, [employer], and [employer] gas card program.  CSW will follow up with patient as needed throughout treatment process.  ONCBCN DISTRESS SCREENING 03/08/2016  Screening Type Initial Screening  Distress experienced in past week (1-10) 5  Practical problem type Food  Emotional problem type Nervousness/Anxiety;Adjusting to illness;Isolation/feeling alone;Feeling hopeless  Information Concerns Type Lack of info about treatment;Lack of info about diagnosis  Physical Problem type Pain;Sleep/insomnia;Mouth sores/swallowing;Loss of appetitie;Constipation/diarrhea;Changes in urination;Swollen  arms/legs  Physician notified of physical symptoms Yes    Polo Riley, MSW, LCSW, OSW-C Clinical Social Worker [facility] [phone]

## 2016-03-11 NOTE — Telephone Encounter (Signed)
Oncology Nurse Navigator Documentation  Oncology Nurse Navigator Flowsheets 03/11/2016  Navigator Encounter Type Telephone/I received notification from Dr. Isidore Moos.  She states patient is having tinnitus.  I updated Dr. Julien Nordmann.  He states side effects form treatment. I called left patient vm message of this and to let us know if this continues. I left phone number to call.   Telephone Outgoing Call  Treatment Phase Treatment  Barriers/Navigation Needs Education  Education Pain/ Symptom Management  Interventions Education Method  Education Method Verbal  Acuity Level 1  Acuity Level 1 Minimal follow up required  Time Spent with Patient 15

## 2016-03-12 ENCOUNTER — Telehealth: Payer: Self-pay | Admitting: Internal Medicine

## 2016-03-12 ENCOUNTER — Other Ambulatory Visit (HOSPITAL_BASED_OUTPATIENT_CLINIC_OR_DEPARTMENT_OTHER): Payer: Medicare Other

## 2016-03-12 ENCOUNTER — Encounter: Payer: Self-pay | Admitting: Internal Medicine

## 2016-03-12 ENCOUNTER — Ambulatory Visit (HOSPITAL_BASED_OUTPATIENT_CLINIC_OR_DEPARTMENT_OTHER): Payer: Medicare Other | Admitting: Internal Medicine

## 2016-03-12 ENCOUNTER — Other Ambulatory Visit: Payer: Self-pay | Admitting: Medical Oncology

## 2016-03-12 ENCOUNTER — Ambulatory Visit
Admission: RE | Admit: 2016-03-12 | Discharge: 2016-03-12 | Disposition: A | Discharge status: Home / Self Care | Payer: Medicare Other | Source: Ambulatory Visit | Attending: Radiation Oncology | Admitting: Radiation Oncology

## 2016-03-12 VITALS — BP 109/68 | HR 77 | Temp 98.4°F | Ht 65.0 in | Wt 129.0 lb

## 2016-03-12 VITALS — BP 113/47 | HR 73 | Temp 98.2°F | Resp 18 | Ht 65.0 in | Wt 128.3 lb

## 2016-03-12 DIAGNOSIS — C3490 Malignant neoplasm of unspecified part of unspecified bronchus or lung: Secondary | ICD-10-CM

## 2016-03-12 DIAGNOSIS — R5383 Other fatigue: Secondary | ICD-10-CM | POA: Diagnosis not present

## 2016-03-12 DIAGNOSIS — Z5111 Encounter for antineoplastic chemotherapy: Secondary | ICD-10-CM

## 2016-03-12 DIAGNOSIS — C771 Secondary and unspecified malignant neoplasm of intrathoracic lymph nodes: Secondary | ICD-10-CM

## 2016-03-12 DIAGNOSIS — R05 Cough: Secondary | ICD-10-CM | POA: Diagnosis not present

## 2016-03-12 DIAGNOSIS — R070 Pain in throat: Secondary | ICD-10-CM | POA: Diagnosis not present

## 2016-03-12 DIAGNOSIS — C3491 Malignant neoplasm of unspecified part of right bronchus or lung: Secondary | ICD-10-CM

## 2016-03-12 DIAGNOSIS — Z510 Encounter for antineoplastic radiation therapy: Secondary | ICD-10-CM | POA: Diagnosis not present

## 2016-03-12 DIAGNOSIS — G4700 Insomnia, unspecified: Secondary | ICD-10-CM

## 2016-03-12 DIAGNOSIS — Z9221 Personal history of antineoplastic chemotherapy: Secondary | ICD-10-CM | POA: Diagnosis not present

## 2016-03-12 HISTORY — DX: Encounter for antineoplastic chemotherapy: Z51.11

## 2016-03-12 LAB — CBC WITH DIFFERENTIAL/PLATELET
BASO%: 0.6 % (ref 0.0–2.0)
Basophils Absolute: 0 10*3/uL (ref 0.0–0.1)
EOS%: 1.1 % (ref 0.0–7.0)
Eosinophils Absolute: 0 10*3/uL (ref 0.0–0.5)
HCT: 41.2 % (ref 34.8–46.6)
HGB: 13.4 g/dL (ref 11.6–15.9)
LYMPH%: 31.8 % (ref 14.0–49.7)
MCH: 29.7 pg (ref 25.1–34.0)
MCHC: 32.6 g/dL (ref 31.5–36.0)
MCV: 91 fL (ref 79.5–101.0)
MONO#: 0.2 10*3/uL (ref 0.1–0.9)
MONO%: 9.4 % (ref 0.0–14.0)
NEUT#: 1.2 10*3/uL — ABNORMAL LOW (ref 1.5–6.5)
NEUT%: 57.1 % (ref 38.4–76.8)
Platelets: 84 10*3/uL — ABNORMAL LOW (ref 145–400)
RBC: 4.53 10*6/uL (ref 3.70–5.45)
RDW: 13.1 % (ref 11.2–14.5)
WBC: 2.1 10*3/uL — ABNORMAL LOW (ref 3.9–10.3)
lymph#: 0.7 10*3/uL — ABNORMAL LOW (ref 0.9–3.3)

## 2016-03-12 LAB — COMPREHENSIVE METABOLIC PANEL
ALT: 9 U/L (ref 0–55)
AST: 25 U/L (ref 5–34)
Albumin: 3.7 g/dL (ref 3.5–5.0)
Alkaline Phosphatase: 69 U/L (ref 40–150)
Anion Gap: 6 mEq/L (ref 3–11)
BUN: 14.5 mg/dL (ref 7.0–26.0)
CO2: 28 mEq/L (ref 22–29)
Calcium: 9.7 mg/dL (ref 8.4–10.4)
Chloride: 105 mEq/L (ref 98–109)
Creatinine: 0.6 mg/dL (ref 0.6–1.1)
EGFR: >90 mL/min/{1.73_m2} (ref >90)
Glucose: 111 mg/dl (ref 70–140)
Potassium: 4.5 mEq/L (ref 3.5–5.1)
Sodium: 139 mEq/L (ref 136–145)
Total Bilirubin: 0.82 mg/dL (ref 0.20–1.20)
Total Protein: 6.4 g/dL (ref 6.4–8.3)

## 2016-03-12 LAB — MAGNESIUM: Magnesium: 2.2 mg/dl (ref 1.5–2.5)

## 2016-03-12 MED ORDER — SODIUM CHLORIDE 0.9% FLUSH
10.0000 mL | Freq: Once | INTRAVENOUS | Status: AC
  Administered 2016-03-12: 10 mL via INTRAVENOUS

## 2016-03-12 MED ORDER — TEMAZEPAM 30 MG PO CAPS: 30.0000 mg | ORAL_CAPSULE | Freq: Every evening | ORAL | 0 refills | Status: DC | PRN

## 2016-03-12 MED ORDER — OXYCODONE-ACETAMINOPHEN 5-325 MG PO TABS: 1.0000 | ORAL_TABLET | Freq: Four times a day (QID) | ORAL | 0 refills | Status: DC | PRN
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
## 2016-03-12 NOTE — Telephone Encounter (Signed)
err

## 2016-03-12 NOTE — Telephone Encounter (Signed)
Avs report and appointment schedule given to patient, per 03/12/16 los.

## 2016-03-12 NOTE — Progress Notes (Signed)
  Radiation Oncology         [phone] ________________________________  Name: Michele [name] MRN: [id number]  Date: 03/12/2016  DOB: December 05, 1955  SIMULATION AND TREATMENT PLANNING NOTE, special treatment procedure  Outpatient  DIAGNOSIS:     ICD-9-CM ICD-10-CM   1. 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  Identity was confirmed.  All relevant records and images related to the planned course of therapy were reviewed.  The patient freely provided informed written consent to proceed with treatment after reviewing the details related to the planned course of therapy. The consent form was witnessed and verified by the simulation staff.    Then, the patient was set-up in a stable reproducible  supine position for radiation therapy.  CT images were obtained.  Surface markings were placed.  The CT images were loaded into the planning software.    TREATMENT PLANNING NOTE: Treatment planning then occurred.  The radiation prescription was entered and confirmed.    A total of 4 medically necessary complex treatment devices were fabricated and supervised by me, in the form of 4 fields with MLCs to block lung, heart , cord, esophagus. MORE FIELDS WITH MLCs MAY BE ADDED IN DOSIMETRY for dose homogeneity.  I have requested : 3D Simulation  I have requested a DVH of the following structures:lung, heart cord, esophagus and targets.   The patient will receive 66 Gy in 33 fractions to mediastinal masses.  Special Treatment Procedure Note: The patient will be receiving chemotherapy concurrently. Chemotherapy heightens the risk of side effects. I have considered this during the patient's treatment planning process and will monitor the patient accordingly for side effects on a weekly basis. Concurrent chemotherapy increases the complexity of this patient's treatment and therefore this constitutes a special  treatment procedure.    -----------------------------------  [name], MD

## 2016-03-14 ENCOUNTER — Emergency Department (HOSPITAL_COMMUNITY): Payer: Medicare Other

## 2016-03-14 ENCOUNTER — Other Ambulatory Visit: Payer: Self-pay

## 2016-03-14 ENCOUNTER — Encounter (HOSPITAL_COMMUNITY): Payer: Self-pay | Admitting: Emergency Medicine

## 2016-03-14 ENCOUNTER — Emergency Department (HOSPITAL_COMMUNITY)
Admission: EM | Admit: 2016-03-14 | Discharge: 2016-03-15 | Disposition: A | Discharge status: Home / Self Care | Payer: Medicare Other | Attending: Emergency Medicine | Admitting: Emergency Medicine

## 2016-03-14 DIAGNOSIS — J449 Chronic obstructive pulmonary disease, unspecified: Secondary | ICD-10-CM | POA: Insufficient documentation

## 2016-03-14 DIAGNOSIS — I2510 Atherosclerotic heart disease of native coronary artery without angina pectoris: Secondary | ICD-10-CM | POA: Insufficient documentation

## 2016-03-14 DIAGNOSIS — I129 Hypertensive chronic kidney disease with stage 1 through stage 4 chronic kidney disease, or unspecified chronic kidney disease: Secondary | ICD-10-CM | POA: Diagnosis not present

## 2016-03-14 DIAGNOSIS — Z79899 Other long term (current) drug therapy: Secondary | ICD-10-CM | POA: Diagnosis not present

## 2016-03-14 DIAGNOSIS — Z87891 Personal history of nicotine dependence: Secondary | ICD-10-CM | POA: Insufficient documentation

## 2016-03-14 DIAGNOSIS — N189 Chronic kidney disease, unspecified: Secondary | ICD-10-CM | POA: Insufficient documentation

## 2016-03-14 DIAGNOSIS — R0602 Shortness of breath: Secondary | ICD-10-CM | POA: Diagnosis not present

## 2016-03-14 DIAGNOSIS — Z85118 Personal history of other malignant neoplasm of bronchus and lung: Secondary | ICD-10-CM | POA: Diagnosis not present

## 2016-03-14 DIAGNOSIS — Z955 Presence of coronary angioplasty implant and graft: Secondary | ICD-10-CM | POA: Insufficient documentation

## 2016-03-14 DIAGNOSIS — J45909 Unspecified asthma, uncomplicated: Secondary | ICD-10-CM | POA: Insufficient documentation

## 2016-03-14 DIAGNOSIS — J209 Acute bronchitis, unspecified: Secondary | ICD-10-CM | POA: Diagnosis not present

## 2016-03-14 DIAGNOSIS — R069 Unspecified abnormalities of breathing: Secondary | ICD-10-CM | POA: Diagnosis not present

## 2016-03-14 DIAGNOSIS — R079 Chest pain, unspecified: Secondary | ICD-10-CM | POA: Diagnosis not present

## 2016-03-14 LAB — COMPREHENSIVE METABOLIC PANEL
ALT: 14 U/L (ref 14–54)
AST: 28 U/L (ref 15–41)
Albumin: 3.4 g/dL — ABNORMAL LOW (ref 3.5–5.0)
Alkaline Phosphatase: 56 U/L (ref 38–126)
Anion gap: 6 (ref 5–15)
BUN: 16 mg/dL (ref 6–20)
CO2: 26 mmol/L (ref 22–32)
Calcium: 8.6 mg/dL — ABNORMAL LOW (ref 8.9–10.3)
Chloride: 109 mmol/L (ref 101–111)
Creatinine, Ser: 0.55 mg/dL (ref 0.44–1.00)
GFR calc Af Amer: >60 mL/min (ref >60)
GFR calc non Af Amer: >60 mL/min (ref >60)
Glucose, Bld: 124 mg/dL — ABNORMAL HIGH (ref 65–99)
Potassium: 3.6 mmol/L (ref 3.5–5.1)
Sodium: 141 mmol/L (ref 135–145)
Total Bilirubin: 0.3 mg/dL (ref 0.3–1.2)
Total Protein: 5.5 g/dL — ABNORMAL LOW (ref 6.5–8.1)

## 2016-03-14 LAB — CULTURE, BLOOD (ROUTINE X 2): Culture: NO GROWTH

## 2016-03-14 LAB — I-STAT CHEM 8, ED
BUN: 22 mg/dL — ABNORMAL HIGH (ref 6–20)
Calcium, Ion: 1.19 mmol/L (ref 1.15–1.40)
Chloride: 102 mmol/L (ref 101–111)
Creatinine, Ser: 0.6 mg/dL (ref 0.44–1.00)
Glucose, Bld: 118 mg/dL — ABNORMAL HIGH (ref 65–99)
HCT: 33 % — ABNORMAL LOW (ref 36.0–46.0)
Hemoglobin: 11.2 g/dL — ABNORMAL LOW (ref 12.0–15.0)
Potassium: 4.3 mmol/L (ref 3.5–5.1)
Sodium: 141 mmol/L (ref 135–145)
TCO2: 31 mmol/L (ref 0–100)

## 2016-03-14 LAB — I-STAT TROPONIN, ED: Troponin i, poc: 0 ng/mL (ref 0.00–0.08)

## 2016-03-14 MED ORDER — IPRATROPIUM-ALBUTEROL 0.5-2.5 (3) MG/3ML IN SOLN
3.0000 mL | Freq: Once | RESPIRATORY_TRACT | Status: AC
  Administered 2016-03-15: 3 mL via RESPIRATORY_TRACT
  Filled 2016-03-14: qty 3

## 2016-03-14 NOTE — ED Notes (Signed)
Bed: WA08 Expected date:  Expected time:  Means of arrival:  Comments: EMS 60 yo female dyspnea after chemo

## 2016-03-14 NOTE — ED Notes (Signed)
Family and pastor at bedside.

## 2016-03-14 NOTE — ED Triage Notes (Signed)
Per EMS pt from home c/o shortness of breath for 2 days. Pt was diagnosed with lung cancer approx a month ago and is currently undergoing chemo. Pt has hx of emphysema and arthritis. C/o lower back pain. Pt placed on 2L [place] en route with EMS for comfort.

## 2016-03-14 NOTE — ED Notes (Signed)
Patient transported to X-ray 

## 2016-03-14 NOTE — ED Provider Notes (Signed)
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 To Thigh  Result Date: 02/16/2016 CLINICAL DATA:  Initial treatment strategy for lung cancer. EXAM: NUCLEAR MEDICINE PET SKULL BASE TO THIGH TECHNIQUE: 12.35 mCi F-18 FDG was injected intravenously. Full-ring PET imaging was performed from the skull base to thigh after the radiotracer. CT data was obtained and used for attenuation correction and anatomic localization. FASTING BLOOD GLUCOSE:  Value: 108 mg/dl COMPARISON:  CT chest dated 02/09/2016. CT abdomen pelvis dated 11/17/2015. FINDINGS: NECK No hypermetabolic lymph nodes in the neck. Hypermetabolism along the right base of tongue/vallecula, max SUV 6.3, likely related to recent biopsy/ procedure. CHEST Hypermetabolic thoracic lymphadenopathy, including: --1.2 cm short axis high right paratracheal node (series 3/ image 68), max SUV 4.8 --2.7 cm short axis right paratracheal node (series 3/ image 82), max SUV 8.6 --2.6 cm short axis low right paratracheal node (series 3/ image 88), max SUV 17.3) Moderate centrilobular and paraseptal emphysematous changes, upper lobe  predominant. No suspicious pulmonary nodules. Mild dependent atelectasis in the bilateral lower lobes. No pleural effusion or pneumothorax. The heart is normal in size. No pericardial effusion. Three vessel coronary atherosclerosis. Mild atherosclerotic calcifications of the aortic arch. ABDOMEN/PELVIS No abnormal hypermetabolic activity within the liver, pancreas, adrenal glands, or spleen. Atherosclerotic calcifications the abdominal aorta and branch vessels. No hypermetabolic lymph nodes in the abdomen or pelvis. SKELETON No focal hypermetabolic activity to suggest skeletal metastasis. IMPRESSION: Hypermetabolic thoracic lymphadenopathy, corresponding to biopsy-proven nodal metastases related to primary bronchogenic neoplasm, as above. Electronically Signed   By: Julian Hy M.D.   On: 02/16/2016 12:13    (all labs ordered are listed, but only abnormal results are displayed) Labs Reviewed  CBC WITH DIFFERENTIAL/PLATELET - Abnormal; Notable for the following:       Result Value   WBC 3.0 (*)    RBC 3.72 (*)    Hemoglobin 11.4 (*)    HCT 34.4 (*)    Platelets 72 (*)    Neutro Abs 1.1 (*)    All other components within normal limits  COMPREHENSIVE METABOLIC PANEL - Abnormal; Notable for the following:    Glucose, Bld 124 (*)    Calcium 8.6 (*)    Total Protein 5.5 (*)    Albumin 3.4 (*)    All other components within normal limits  I-STAT CHEM 8, ED - Abnormal; Notable for the following:    BUN 22 (*)    Glucose, Bld 118 (*)    Hemoglobin 11.2 (*)    HCT 33.0 (*)    All other components within normal limits  I-STAT TROPOININ, ED  I-STAT TROPOININ, ED    Date: 03/15/2016  Rate: 84  Rhythm: normal sinus rhythm  QRS Axis: normal  Intervals: normal  ST/T Wave abnormalities: normal  Conduction Disutrbances: none  Narrative Interpretation: PACs    Radiology Dg Chest 2 View  Result Date: 03/14/2016 CLINICAL DATA:  Generalized chest pain. Shortness of breath for 3 days EXAM:  CHEST  2 VIEW COMPARISON:  03/09/2016, 02/09/2016 FINDINGS: Mild bibasilar opacities could reflect atelectasis. No focal consolidation or effusion. Marked emphysematous disease within the upper lobes, right greater than left. Normal heart size. Atherosclerosis of the aorta. No pneumothorax. IMPRESSION: 1. Marked emphysematous changes in the bilateral upper  lobes. 2. Streaky bibasilar atelectasis versus mild interstitial inflammation. Electronically Signed   By: Donavan Foil M.D.   On: 03/14/2016 23:57   Ct Angio Chest Pe W And/or Wo Contrast  Result Date: 03/15/2016 CLINICAL DATA:  Acute onset of shortness of breath. Recently diagnosed with small cell lung cancer, on chemotherapy. Initial encounter. EXAM: CT ANGIOGRAPHY CHEST WITH CONTRAST TECHNIQUE: Multidetector CT imaging of the chest was performed using the standard protocol during bolus administration of intravenous contrast. Multiplanar CT image reconstructions and MIPs were obtained to evaluate the vascular anatomy. CONTRAST:  80 mL of Isovue 370 IV contrast COMPARISON:  Chest radiograph performed 03/14/2016, and PET/CT performed 02/16/2016 FINDINGS: Cardiovascular:  There is no evidence of pulmonary embolus. Scattered calcification is noted along the thoracic aorta. Diffuse coronary artery calcifications are seen. The heart is grossly unremarkable in appearance. The proximal great vessels are unremarkable. Mediastinum/Nodes: Enlarged mediastinal nodes are seen, measuring up to 2.5 cm in short axis, at the right paratracheal and precarinal regions. These likely reflect metastatic disease. No pericardial effusion is identified. The visualized portions of the thyroid gland are unremarkable. No axillary lymphadenopathy is seen. A small hiatal hernia is noted. Lungs/Pleura: Bibasilar atelectasis is noted. Emphysema is noted bilaterally, more prominent at the right lung apex. No pulmonary nodules are seen. No pleural effusion or pneumothorax is identified.  Upper Abdomen: The visualized portions of the liver and spleen are grossly unremarkable. Musculoskeletal: No acute osseous abnormalities are identified. The visualized musculature is unremarkable in appearance. Review of the MIP images confirms the above findings. IMPRESSION: 1. No evidence of pulmonary embolus. 2. Enlarged mediastinal nodes, measuring up to 2.5 cm in short axis, reflecting the patient's known nodal metastases. These are perhaps slightly less prominent than on the recent prior PET/CT. 3. Diffuse coronary artery calcifications seen. 4. Small hiatal hernia noted. 5. Bibasilar atelectasis noted. Bilateral emphysema, more prominent at the right lung apex. Electronically Signed   By: Garald Balding M.D.   On: 03/15/2016 01:43    Procedures Procedures  DIAGNOSTIC STUDIES: Oxygen Saturation is 97% on RA, normal by my interpretation.    COORDINATION OF CARE: 11:12 PM Discussed next steps with pt. Pt verbalized understanding and is agreeable with the plan.    Medications Ordered in ED Medications  ipratropium-albuterol (DUONEB) 0.5-2.5 (3) MG/3ML nebulizer solution 3 mL (3 mLs Nebulization Given 03/15/16 0038)  iopamidol (ISOVUE-370) 76 % injection 100 mL (80 mLs Intravenous Contrast Given 03/15/16 0057)    Ruled out for MI in the Ed with normal EKG and 2 negative delta troponins.  No dissection nor PE on angio of the chest.  O2 saturation is normal.  I suspect this is a function of the cancer and chemotherapy itself but given that the patient has been caring for family members with bronchitis patient will be given doxycycline and instructed to use her inhalers Q4 hours and to speak with her oncologist about her symptoms Pt told to use albuterol inhaler every four hours and Spiriva as directed. Will start on doxycycline. Pt instructed to speak to oncologist regarding symptoms. Pt instructed to f/u with chemotherapy specialist in the morning to discuss ongoing management of her symptoms.  Suspect is related to COPD and CA treatment. No PE, dissection or pneumonia. Pt is not neutropenic, she is afebrile and denies CP.   Strict chest pain and SoB return instructions given.   All questions answered to patient's satisfaction. Based on history and exam patient has been appropriately medically screened and emergency conditions  excluded. Patient is stable for discharge at this time. Follow up with your PMD for recheck in 2 days and strict return precautions given.  I personally performed the services described in this documentation, which was scribed in my presence. The recorded information has been reviewed and is accurate.    Final Clinical Impressions(s) / ED Diagnoses   New Prescriptions New Prescriptions   No medications on file     Takesha Steger, MD 03/15/16 [phone]

## 2016-03-14 NOTE — ED Notes (Signed)
EKG given to EDP,Palumbo,MD., for review. 

## 2016-03-15 ENCOUNTER — Emergency Department (HOSPITAL_COMMUNITY): Payer: Medicare Other

## 2016-03-15 ENCOUNTER — Telehealth: Payer: Self-pay | Admitting: *Deleted

## 2016-03-15 ENCOUNTER — Encounter (HOSPITAL_COMMUNITY): Payer: Self-pay

## 2016-03-15 DIAGNOSIS — C3491 Malignant neoplasm of unspecified part of right bronchus or lung: Secondary | ICD-10-CM | POA: Diagnosis not present

## 2016-03-15 DIAGNOSIS — C771 Secondary and unspecified malignant neoplasm of intrathoracic lymph nodes: Secondary | ICD-10-CM | POA: Diagnosis not present

## 2016-03-15 DIAGNOSIS — Z9221 Personal history of antineoplastic chemotherapy: Secondary | ICD-10-CM | POA: Diagnosis not present

## 2016-03-15 DIAGNOSIS — Z510 Encounter for antineoplastic radiation therapy: Secondary | ICD-10-CM | POA: Diagnosis not present

## 2016-03-15 DIAGNOSIS — R05 Cough: Secondary | ICD-10-CM | POA: Diagnosis not present

## 2016-03-15 DIAGNOSIS — J209 Acute bronchitis, unspecified: Secondary | ICD-10-CM | POA: Diagnosis not present

## 2016-03-15 DIAGNOSIS — R070 Pain in throat: Secondary | ICD-10-CM | POA: Diagnosis not present

## 2016-03-15 DIAGNOSIS — R0602 Shortness of breath: Secondary | ICD-10-CM | POA: Diagnosis not present

## 2016-03-15 LAB — CBC WITH DIFFERENTIAL/PLATELET
Basophils Absolute: 0 10*3/uL (ref 0.0–0.1)
Basophils Relative: 0 %
Eosinophils Absolute: 0 10*3/uL (ref 0.0–0.7)
Eosinophils Relative: 1 %
HCT: 34.4 % — ABNORMAL LOW (ref 36.0–46.0)
Hemoglobin: 11.4 g/dL — ABNORMAL LOW (ref 12.0–15.0)
Lymphocytes Relative: 47 %
Lymphs Abs: 1.4 10*3/uL (ref 0.7–4.0)
MCH: 30.6 pg (ref 26.0–34.0)
MCHC: 33.1 g/dL (ref 30.0–36.0)
MCV: 92.5 fL (ref 78.0–100.0)
Monocytes Absolute: 0.5 10*3/uL (ref 0.1–1.0)
Monocytes Relative: 16 %
Neutro Abs: 1.1 10*3/uL — ABNORMAL LOW (ref 1.7–7.7)
Neutrophils Relative %: 36 %
Platelets: 72 10*3/uL — ABNORMAL LOW (ref 150–400)
RBC: 3.72 MIL/uL — ABNORMAL LOW (ref 3.87–5.11)
RDW: 13.2 % (ref 11.5–15.5)
WBC: 3 10*3/uL — ABNORMAL LOW (ref 4.0–10.5)

## 2016-03-15 LAB — I-STAT TROPONIN, ED: Troponin i, poc: 0.01 ng/mL (ref 0.00–0.08)

## 2016-03-15 MED ORDER — IOPAMIDOL (ISOVUE-370) INJECTION 76%
100.0000 mL | Freq: Once | INTRAVENOUS | Status: AC | PRN
  Administered 2016-03-15: 80 mL via INTRAVENOUS

## 2016-03-15 MED ORDER — DOXYCYCLINE HYCLATE 100 MG PO TABS
100.0000 mg | ORAL_TABLET | Freq: Once | ORAL | Status: AC
  Administered 2016-03-15: 100 mg via ORAL
  Filled 2016-03-15: qty 1

## 2016-03-15 MED ORDER — DOXYCYCLINE HYCLATE 100 MG PO CAPS: 100.0000 mg | ORAL_CAPSULE | Freq: Two times a day (BID) | ORAL | 0 refills | Status: DC

## 2016-03-15 NOTE — ED Notes (Signed)
Patient transported to CT 

## 2016-03-15 NOTE — Telephone Encounter (Signed)
FYI "I went to the Hospital yesterday for trouble breathing.  I was given a form that reads Bronchitis.  I do not have this but they're going to treat it with Doxycycline 100 mg twice a day x 7 days.  Should I take this?  They also said I am to use Spiriva everyday and use ventolin HFA every four hours.  No one has told me this before.  I don't see the doctors that ordered Spiriva anymore.  Is this going to interfere with the chemotherapy.  Instructed to follow the ED providers instructions.  Discuss inhaler use with PCP and obtain refills as these will help with cough and wheezing.  "I do not wheeze.  I just couldn't breath good."   Scheduled for CT chest today.

## 2016-03-18 ENCOUNTER — Other Ambulatory Visit (HOSPITAL_BASED_OUTPATIENT_CLINIC_OR_DEPARTMENT_OTHER): Payer: Medicare Other

## 2016-03-18 DIAGNOSIS — C3490 Malignant neoplasm of unspecified part of unspecified bronchus or lung: Secondary | ICD-10-CM | POA: Diagnosis present

## 2016-03-18 DIAGNOSIS — C3491 Malignant neoplasm of unspecified part of right bronchus or lung: Secondary | ICD-10-CM

## 2016-03-18 LAB — CBC WITH DIFFERENTIAL/PLATELET
BASO%: 0.4 % (ref 0.0–2.0)
Basophils Absolute: 0 10*3/uL (ref 0.0–0.1)
EOS%: 0.3 % (ref 0.0–7.0)
Eosinophils Absolute: 0 10*3/uL (ref 0.0–0.5)
HCT: 38.5 % (ref 34.8–46.6)
HGB: 12.7 g/dL (ref 11.6–15.9)
LYMPH%: 17.5 % (ref 14.0–49.7)
MCH: 30.3 pg (ref 25.1–34.0)
MCHC: 33 g/dL (ref 31.5–36.0)
MCV: 91.9 fL (ref 79.5–101.0)
MONO#: 0.8 10*3/uL (ref 0.1–0.9)
MONO%: 10.1 % (ref 0.0–14.0)
NEUT#: 5.5 10*3/uL (ref 1.5–6.5)
NEUT%: 71.7 % (ref 38.4–76.8)
Platelets: 122 10*3/uL — ABNORMAL LOW (ref 145–400)
RBC: 4.19 10*6/uL (ref 3.70–5.45)
RDW: 14 % (ref 11.2–14.5)
WBC: 7.7 10*3/uL (ref 3.9–10.3)
lymph#: 1.4 10*3/uL (ref 0.9–3.3)

## 2016-03-18 LAB — COMPREHENSIVE METABOLIC PANEL
ALT: 12 U/L (ref 0–55)
AST: 29 U/L (ref 5–34)
Albumin: 3.5 g/dL (ref 3.5–5.0)
Alkaline Phosphatase: 73 U/L (ref 40–150)
Anion Gap: 7 mEq/L (ref 3–11)
BUN: 13.5 mg/dL (ref 7.0–26.0)
CO2: 27 mEq/L (ref 22–29)
Calcium: 9.4 mg/dL (ref 8.4–10.4)
Chloride: 106 mEq/L (ref 98–109)
Creatinine: 0.6 mg/dL (ref 0.6–1.1)
EGFR: >90 mL/min/{1.73_m2} (ref >90)
Glucose: 92 mg/dl (ref 70–140)
Potassium: 4.3 mEq/L (ref 3.5–5.1)
Sodium: 140 mEq/L (ref 136–145)
Total Bilirubin: <0.22 mg/dL (ref 0.20–1.20)
Total Protein: 6.3 g/dL — ABNORMAL LOW (ref 6.4–8.3)

## 2016-03-18 LAB — MAGNESIUM: Magnesium: 2.2 mg/dl (ref 1.5–2.5)

## 2016-03-19 ENCOUNTER — Ambulatory Visit
Admission: RE | Admit: 2016-03-19 | Discharge: 2016-03-19 | Disposition: A | Discharge status: Home / Self Care | Payer: Medicare Other | Source: Ambulatory Visit | Attending: Radiation Oncology | Admitting: Radiation Oncology

## 2016-03-19 DIAGNOSIS — C3491 Malignant neoplasm of unspecified part of right bronchus or lung: Secondary | ICD-10-CM | POA: Diagnosis not present

## 2016-03-19 DIAGNOSIS — Z9221 Personal history of antineoplastic chemotherapy: Secondary | ICD-10-CM | POA: Diagnosis not present

## 2016-03-19 DIAGNOSIS — R070 Pain in throat: Secondary | ICD-10-CM | POA: Diagnosis not present

## 2016-03-19 DIAGNOSIS — Z510 Encounter for antineoplastic radiation therapy: Secondary | ICD-10-CM | POA: Diagnosis not present

## 2016-03-19 DIAGNOSIS — C771 Secondary and unspecified malignant neoplasm of intrathoracic lymph nodes: Secondary | ICD-10-CM | POA: Diagnosis not present

## 2016-03-19 DIAGNOSIS — R05 Cough: Secondary | ICD-10-CM | POA: Diagnosis not present

## 2016-03-20 ENCOUNTER — Ambulatory Visit
Admission: RE | Admit: 2016-03-20 | Discharge: 2016-03-20 | Disposition: A | Discharge status: Home / Self Care | Payer: Medicare Other | Source: Ambulatory Visit | Attending: Radiation Oncology | Admitting: Radiation Oncology

## 2016-03-20 DIAGNOSIS — Z510 Encounter for antineoplastic radiation therapy: Secondary | ICD-10-CM | POA: Diagnosis not present

## 2016-03-20 DIAGNOSIS — R070 Pain in throat: Secondary | ICD-10-CM | POA: Diagnosis not present

## 2016-03-20 DIAGNOSIS — C771 Secondary and unspecified malignant neoplasm of intrathoracic lymph nodes: Secondary | ICD-10-CM | POA: Diagnosis not present

## 2016-03-20 DIAGNOSIS — Z9221 Personal history of antineoplastic chemotherapy: Secondary | ICD-10-CM | POA: Diagnosis not present

## 2016-03-20 DIAGNOSIS — C3491 Malignant neoplasm of unspecified part of right bronchus or lung: Secondary | ICD-10-CM | POA: Diagnosis not present

## 2016-03-20 DIAGNOSIS — R05 Cough: Secondary | ICD-10-CM | POA: Diagnosis not present

## 2016-03-21 ENCOUNTER — Ambulatory Visit
Admission: RE | Admit: 2016-03-21 | Discharge: 2016-03-21 | Disposition: A | Discharge status: Home / Self Care | Payer: Medicare Other | Source: Ambulatory Visit | Attending: Radiation Oncology | Admitting: Radiation Oncology

## 2016-03-21 DIAGNOSIS — Z510 Encounter for antineoplastic radiation therapy: Secondary | ICD-10-CM | POA: Diagnosis not present

## 2016-03-21 DIAGNOSIS — C3491 Malignant neoplasm of unspecified part of right bronchus or lung: Secondary | ICD-10-CM | POA: Diagnosis not present

## 2016-03-21 DIAGNOSIS — C771 Secondary and unspecified malignant neoplasm of intrathoracic lymph nodes: Secondary | ICD-10-CM | POA: Insufficient documentation

## 2016-03-21 DIAGNOSIS — R05 Cough: Secondary | ICD-10-CM | POA: Diagnosis not present

## 2016-03-21 DIAGNOSIS — Z9221 Personal history of antineoplastic chemotherapy: Secondary | ICD-10-CM | POA: Diagnosis not present

## 2016-03-21 DIAGNOSIS — R070 Pain in throat: Secondary | ICD-10-CM | POA: Diagnosis not present

## 2016-03-21 MED ORDER — RADIAPLEXRX EX GEL
Freq: Once | CUTANEOUS | Status: AC
  Administered 2016-03-21: 13:00:00 via TOPICAL

## 2016-03-21 NOTE — Progress Notes (Signed)
Pt here for patient teaching.  Pt given Radiation and You booklet, skin care instructions and Radiaplex gel. Pt reports they have not watched the Radiation Therapy Education video, but were given the link to watch at home.  Reviewed areas of pertinence such as fatigue, skin changes, throat changes and cough . Pt able to give teach back of to pat skin, use unscented/gentle soap and drink plenty of water,apply Radiaplex bid and avoid applying anything to skin within 4 hours of treatment. Pt verbalizes understanding of information given and will contact nursing with any questions or concerns.     [email or web address]

## 2016-03-22 ENCOUNTER — Encounter: Payer: Self-pay | Admitting: Radiation Oncology

## 2016-03-22 ENCOUNTER — Other Ambulatory Visit: Payer: Self-pay | Admitting: Radiation Oncology

## 2016-03-22 ENCOUNTER — Ambulatory Visit
Admission: RE | Admit: 2016-03-22 | Discharge: 2016-03-22 | Disposition: A | Discharge status: Home / Self Care | Payer: Medicare Other | Source: Ambulatory Visit | Attending: Radiation Oncology | Admitting: Radiation Oncology

## 2016-03-22 ENCOUNTER — Encounter: Payer: Self-pay | Admitting: Pharmacist

## 2016-03-22 VITALS — Temp 97.8°F | Wt 133.0 lb

## 2016-03-22 DIAGNOSIS — R05 Cough: Secondary | ICD-10-CM | POA: Diagnosis not present

## 2016-03-22 DIAGNOSIS — C771 Secondary and unspecified malignant neoplasm of intrathoracic lymph nodes: Secondary | ICD-10-CM

## 2016-03-22 DIAGNOSIS — H9313 Tinnitus, bilateral: Secondary | ICD-10-CM

## 2016-03-22 DIAGNOSIS — H8110 Benign paroxysmal vertigo, unspecified ear: Secondary | ICD-10-CM

## 2016-03-22 DIAGNOSIS — H9319 Tinnitus, unspecified ear: Secondary | ICD-10-CM

## 2016-03-22 DIAGNOSIS — Z9221 Personal history of antineoplastic chemotherapy: Secondary | ICD-10-CM | POA: Diagnosis not present

## 2016-03-22 DIAGNOSIS — R070 Pain in throat: Secondary | ICD-10-CM | POA: Diagnosis not present

## 2016-03-22 DIAGNOSIS — Z510 Encounter for antineoplastic radiation therapy: Secondary | ICD-10-CM | POA: Diagnosis not present

## 2016-03-22 DIAGNOSIS — C3491 Malignant neoplasm of unspecified part of right bronchus or lung: Secondary | ICD-10-CM | POA: Diagnosis not present

## 2016-03-22 NOTE — Progress Notes (Signed)
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  CBCT/MVCT images/Port film x-rays were reviewed.  The chart was checked.  Michele [name] is here for reports of dizziness/vertigo. She reports dizziness when laying down for her treatment today and yesterday. She also has had dizziness/vertigo at home when laying her head down. She also reports some dizziness at times when standing. She also is reporting back pain which has increased since starting radiation. She has hydrocodone at home, but tells me she takes it very infrequently, with her last dose being last night. She reports a headache that also began a few days ago. She last received chemotherapy two weeks from this past Wednesday. She tells me she feels like this is a "chemo week" because of feeling badly. She feels like she is eating well.  Reports new tinnitus.  Temp 97.8 F (36.6 C)   Wt 133 lb (60.3 kg)   SpO2 100%   BMI 22.13 kg/m    Orthostatics: sitting BP 91/54, pulse 75. Standing BP 103/54, pulse 83.   Physical Findings:  Wt Readings from Last 3 Encounters:  03/22/16 133 lb (60.3 kg)  03/12/16 128 lb 4.8 oz (58.2 kg)  03/12/16 129 lb (58.5 kg)    weight is 133 lb (60.3 kg). Her temperature is 97.8 F (36.6 C). Her oxygen saturation is 100%.  finger to nose testing, RAM's intact. No nystagmus.  Ambulatory .  Right tympanic membrane is clear.Left TM is occluded by cerumen  CBC    Component Value Date/Time   WBC 7.7 03/18/2016 1148   WBC 3.0 (L) 03/14/2016 2317   RBC 4.19 03/18/2016 1148   RBC 3.72 (L) 03/14/2016 2317   HGB 12.7 03/18/2016 1148   HCT 38.5 03/18/2016 1148   PLT 122 (L) 03/18/2016 1148   MCV 91.9 03/18/2016 1148   MCH 30.3 03/18/2016 1148   MCH 30.6 03/14/2016 2317   MCHC 33.0 03/18/2016 1148   MCHC 33.1 03/14/2016 2317   RDW 14.0 03/18/2016 1148   LYMPHSABS 1.4 03/18/2016 1148   MONOABS 0.8 03/18/2016 1148   EOSABS 0.0 03/18/2016 1148   BASOSABS 0.0 03/18/2016 1148     CMP     Component Value Date/Time   NA 140 03/18/2016 1148   K 4.3 03/18/2016 1148   CL 102 03/14/2016 2338   CO2 27 03/18/2016 1148   GLUCOSE 92 03/18/2016 1148   BUN 13.5 03/18/2016 1148   CREATININE 0.6 03/18/2016 1148   CALCIUM 9.4 03/18/2016 1148   PROT 6.3 (L) 03/18/2016 1148   ALBUMIN 3.5 03/18/2016 1148   AST 29 03/18/2016 1148   ALT 12 03/18/2016 1148   ALKPHOS 73 03/18/2016 1148   BILITOT <0.22 03/18/2016 1148   GFRNONAA >60 03/14/2016 2317   GFRAA >60 03/14/2016 2317     Impression:  The patient is tolerating radiotherapy.   Plan:  Continue radiotherapy as planned. Main complaints are vertigo especially with supine position, and tinnitus. Also HAs and diffuse bone pain. Recent imaging (CT chest, PET, brain MRI) are reassuring to make metastatic disease less likely.  I am not sure if the systemic therapy is responsible for autonomic dysfunction including vertigo/dizziness in addition to her tinnitus. Will notify med/onc and hold off on ENT referral unless Dr Earlie Server thinks this is  necessary.  I will see her again in a few days.  -----------------------------------  [name], MD

## 2016-03-22 NOTE — Progress Notes (Addendum)
Michele [name] is here for reports of dizziness. She reports dizziness when laying down for her treatment today and yesterday. She also has had dizziness at home when laying her head down. She also reports some dizziness at times when standing. She also is reporting back pain which has increased since starting radiation. She has hydrocodone at home, but tells me she takes it very infrequently, with her last dose being last night. She reports a headache that also began a few days ago. She last received chemotherapy two weeks from this past Wednesday. She tells me she feels like this is a "chemo week" because of feeling badly. She feels like she is eating well.   Temp 97.8 F (36.6 C)   SpO2 100%    Orthostatics: sitting BP 91/54, pulse 75. Standing BP 103/54, pulse 83.  Wt Readings from Last 3 Encounters:  03/22/16 133 lb (60.3 kg)  03/12/16 128 lb 4.8 oz (58.2 kg)  03/12/16 129 lb (58.5 kg)

## 2016-03-25 ENCOUNTER — Ambulatory Visit
Admission: RE | Admit: 2016-03-25 | Discharge: 2016-03-25 | Disposition: A | Discharge status: Home / Self Care | Payer: Medicare Other | Source: Ambulatory Visit | Attending: Radiation Oncology | Admitting: Radiation Oncology

## 2016-03-25 ENCOUNTER — Other Ambulatory Visit: Payer: Self-pay | Admitting: Internal Medicine

## 2016-03-25 ENCOUNTER — Ambulatory Visit (HOSPITAL_BASED_OUTPATIENT_CLINIC_OR_DEPARTMENT_OTHER): Payer: Medicare Other | Admitting: Nurse Practitioner

## 2016-03-25 ENCOUNTER — Other Ambulatory Visit (HOSPITAL_BASED_OUTPATIENT_CLINIC_OR_DEPARTMENT_OTHER): Payer: Medicare Other

## 2016-03-25 ENCOUNTER — Ambulatory Visit (HOSPITAL_BASED_OUTPATIENT_CLINIC_OR_DEPARTMENT_OTHER): Payer: Medicare Other

## 2016-03-25 ENCOUNTER — Telehealth: Payer: Self-pay | Admitting: *Deleted

## 2016-03-25 ENCOUNTER — Other Ambulatory Visit: Payer: Self-pay | Admitting: Radiation Therapy

## 2016-03-25 VITALS — BP 107/59 | HR 86 | Temp 98.1°F | Resp 12 | Wt 132.0 lb

## 2016-03-25 VITALS — BP 126/61 | HR 76 | Temp 97.5°F | Resp 16 | Ht 65.0 in | Wt 132.2 lb

## 2016-03-25 DIAGNOSIS — C771 Secondary and unspecified malignant neoplasm of intrathoracic lymph nodes: Secondary | ICD-10-CM | POA: Diagnosis not present

## 2016-03-25 DIAGNOSIS — C3490 Malignant neoplasm of unspecified part of unspecified bronchus or lung: Secondary | ICD-10-CM

## 2016-03-25 DIAGNOSIS — C3491 Malignant neoplasm of unspecified part of right bronchus or lung: Secondary | ICD-10-CM | POA: Diagnosis not present

## 2016-03-25 DIAGNOSIS — R05 Cough: Secondary | ICD-10-CM | POA: Diagnosis not present

## 2016-03-25 DIAGNOSIS — R070 Pain in throat: Secondary | ICD-10-CM | POA: Diagnosis not present

## 2016-03-25 DIAGNOSIS — Z5111 Encounter for antineoplastic chemotherapy: Secondary | ICD-10-CM | POA: Diagnosis present

## 2016-03-25 DIAGNOSIS — R42 Dizziness and giddiness: Secondary | ICD-10-CM

## 2016-03-25 DIAGNOSIS — Z510 Encounter for antineoplastic radiation therapy: Secondary | ICD-10-CM | POA: Diagnosis not present

## 2016-03-25 DIAGNOSIS — Z9221 Personal history of antineoplastic chemotherapy: Secondary | ICD-10-CM | POA: Diagnosis not present

## 2016-03-25 LAB — CBC WITH DIFFERENTIAL/PLATELET
BASO%: 0.7 % (ref 0.0–2.0)
Basophils Absolute: 0.1 10*3/uL (ref 0.0–0.1)
EOS%: 0.1 % (ref 0.0–7.0)
Eosinophils Absolute: 0 10*3/uL (ref 0.0–0.5)
HCT: 40.6 % (ref 34.8–46.6)
HGB: 13.6 g/dL (ref 11.6–15.9)
LYMPH%: 9.6 % — ABNORMAL LOW (ref 14.0–49.7)
MCH: 30.4 pg (ref 25.1–34.0)
MCHC: 33.4 g/dL (ref 31.5–36.0)
MCV: 91 fL (ref 79.5–101.0)
MONO#: 0.5 10*3/uL (ref 0.1–0.9)
MONO%: 6.5 % (ref 0.0–14.0)
NEUT#: 6 10*3/uL (ref 1.5–6.5)
NEUT%: 83.1 % — ABNORMAL HIGH (ref 38.4–76.8)
Platelets: 232 10*3/uL (ref 145–400)
RBC: 4.46 10*6/uL (ref 3.70–5.45)
RDW: 14.4 % (ref 11.2–14.5)
WBC: 7.2 10*3/uL (ref 3.9–10.3)
lymph#: 0.7 10*3/uL — ABNORMAL LOW (ref 0.9–3.3)

## 2016-03-25 LAB — COMPREHENSIVE METABOLIC PANEL
ALT: 13 U/L (ref 0–55)
AST: 29 U/L (ref 5–34)
Albumin: 3.5 g/dL (ref 3.5–5.0)
Alkaline Phosphatase: 78 U/L (ref 40–150)
Anion Gap: 10 mEq/L (ref 3–11)
BUN: 11.3 mg/dL (ref 7.0–26.0)
CO2: 26 mEq/L (ref 22–29)
Calcium: 9.8 mg/dL (ref 8.4–10.4)
Chloride: 106 mEq/L (ref 98–109)
Creatinine: 0.7 mg/dL (ref 0.6–1.1)
EGFR: >90 mL/min/{1.73_m2} (ref >90)
Glucose: 209 mg/dl — ABNORMAL HIGH (ref 70–140)
Potassium: 4 mEq/L (ref 3.5–5.1)
Sodium: 141 mEq/L (ref 136–145)
Total Bilirubin: 0.27 mg/dL (ref 0.20–1.20)
Total Protein: 7 g/dL (ref 6.4–8.3)

## 2016-03-25 LAB — MAGNESIUM: Magnesium: 2.1 mg/dl (ref 1.5–2.5)

## 2016-03-25 MED ORDER — POTASSIUM CHLORIDE 2 MEQ/ML IV SOLN
Freq: Once | INTRAVENOUS | Status: AC
  Administered 2016-03-25: 12:00:00 via INTRAVENOUS
  Filled 2016-03-25: qty 10

## 2016-03-25 MED ORDER — SODIUM CHLORIDE 0.9 % IV SOLN
Freq: Once | INTRAVENOUS | Status: AC
  Administered 2016-03-25: 12:00:00 via INTRAVENOUS

## 2016-03-25 MED ORDER — SODIUM CHLORIDE 0.9 % IV SOLN
61.0000 mg/m2 | Freq: Once | INTRAVENOUS | Status: AC
  Administered 2016-03-25: 100 mg via INTRAVENOUS
  Filled 2016-03-25: qty 100

## 2016-03-25 MED ORDER — PALONOSETRON HCL INJECTION 0.25 MG/5ML
INTRAVENOUS | Status: AC
  Filled 2016-03-25: qty 5

## 2016-03-25 MED ORDER — SODIUM CHLORIDE 0.9 % IV SOLN
Freq: Once | INTRAVENOUS | Status: AC
  Administered 2016-03-25: 15:00:00 via INTRAVENOUS
  Filled 2016-03-25: qty 5

## 2016-03-25 MED ORDER — PALONOSETRON HCL INJECTION 0.25 MG/5ML
0.2500 mg | Freq: Once | INTRAVENOUS | Status: AC
  Administered 2016-03-25: 0.25 mg via INTRAVENOUS

## 2016-03-25 MED ORDER — SODIUM CHLORIDE 0.9 % IV SOLN
120.0000 mg/m2 | Freq: Once | INTRAVENOUS | Status: AC
  Administered 2016-03-25: 200 mg via INTRAVENOUS
  Filled 2016-03-25: qty 10

## 2016-03-25 NOTE — Progress Notes (Signed)
OK to run post hydration fluids at 500 ml/hr per Dr. Julien Nordmann.

## 2016-03-25 NOTE — Telephone Encounter (Signed)
Called patient to inform of appt. With Dr. Constance Holster on 04-04-16 - arrival time - 2:20 pm, lvm for a return call

## 2016-03-25 NOTE — Progress Notes (Signed)
   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  CBCT/MVCT images/Port film x-rays were reviewed.  The chart was checked.   Pt denies dysphagia. The patient eats a regular, healthy diet. Pt complains of fatigue, weakness and loss of sleep. Pt feeling dizzy when standing, feels ill today.  Scheduled chemo for today and this week. Continued HAs, vertigo with lying down, nausea.  MRI brain negative 3 weeks ago.  BP (!) 107/59   Pulse 86   Temp 98.1 F (36.7 C) (Oral)   Resp 12   Wt 132 lb (59.9 kg)   BMI 21.97 kg/m    Orthostatics: sitting BP 91/54, pulse 75. Standing BP 103/54, pulse 83.   Physical Findings:  Wt Readings from Last 3 Encounters:  03/25/16 132 lb (59.9 kg)  03/22/16 133 lb (60.3 kg)  03/12/16 128 lb 4.8 oz (58.2 kg)    weight is 132 lb (59.9 kg). Her oral temperature is 98.1 F (36.7 C). Her blood pressure is 107/59 (abnormal) and her pulse is 86. Her respiration is 12.  Miserable appearing.  In chair.  CBC    Component Value Date/Time   WBC 7.2 03/25/2016 0847   WBC 3.0 (L) 03/14/2016 2317   RBC 4.46 03/25/2016 0847   RBC 3.72 (L) 03/14/2016 2317   HGB 13.6 03/25/2016 0847   HCT 40.6 03/25/2016 0847   PLT 232 03/25/2016 0847   MCV 91.0 03/25/2016 0847   MCH 30.4 03/25/2016 0847   MCH 30.6 03/14/2016 2317   MCHC 33.4 03/25/2016 0847   MCHC 33.1 03/14/2016 2317   RDW 14.4 03/25/2016 0847   LYMPHSABS 0.7 (L) 03/25/2016 0847   MONOABS 0.5 03/25/2016 0847   EOSABS 0.0 03/25/2016 0847   BASOSABS 0.1 03/25/2016 0847     CMP     Component Value Date/Time   NA 141 03/25/2016 0847   K 4.0 03/25/2016 0847   CL 102 03/14/2016 2338   CO2 26 03/25/2016 0847   GLUCOSE 209 (H) 03/25/2016 0847   BUN 11.3 03/25/2016 0847   CREATININE 0.7 03/25/2016 0847     CALCIUM 9.8 03/25/2016 0847   PROT 7.0 03/25/2016 0847   ALBUMIN 3.5 03/25/2016 0847   AST 29 03/25/2016 0847   ALT 13 03/25/2016 0847   ALKPHOS 78 03/25/2016 0847   BILITOT 0.27 03/25/2016 0847   GFRNONAA >60 03/14/2016 2317   GFRAA >60 03/14/2016 2317     Impression:  The patient is tolerating radiotherapy with difficulty.  Symptoms unrelated to RT  Plan:  Continue radiotherapy as planned.   I asked my staff to arrange imaging next available the form of: BRAIN MRI, usual protocol.  Needs work up for "tinnitus, headaches, dizziness, vertigo, nausea, new onset during chemoRT for lung cancer."  Also referred patient to ENT for tinnitus, vertigo outside expected sx from chemo, per discussion w/ Dr Earlie Server -----------------------------------  [name], MD

## 2016-03-25 NOTE — Progress Notes (Signed)
  [facility] OFFICE PROGRESS NOTE   DIAGNOSIS: Limited stage (T0, N2, M0) small cell lung cancer presented with right mediastinal lymphadenopathy diagnosed in September 2017.  PRIOR THERAPY: None.  CURRENT THERAPY: Systemic chemotherapy consisting of cisplatin 60 MG/M2 on day 1 and etoposide 120 MG/M2 on days 1, 2 and 3 every 3 weeks.    INTERVAL HISTORY:   Michele [name] returns as scheduled. She completed cycle 1 cisplatin/etoposide 03/04/2016. She began chest radiation 03/19/2016. She denies nausea/vomiting. No mouth sores. No diarrhea. She noted increased ringing in the ears. She had some ringing in the ears prior to chemotherapy. For the past week or so she has had intermittent dizziness. She notes that the room is periodically "spinning". She reports vertigo in the past with similar symptoms. She has been referred to ENT. She feels weak. She reports adequate oral intake. She had "arthritis like pain" following Neulasta.  Objective:  Vital signs in last 24 hours:  Blood pressure 126/61, pulse 76, temperature 97.5 F (36.4 C), temperature source Oral, resp. rate 16, height '5\' 5"'$  (1.651 m), weight 132 lb 3.2 oz (60 kg), SpO2 100 %.    HEENT: No thrush or ulcers. Mucous membranes appear moist. Resp: Lungs clear bilaterally. Cardio: Regular rate and rhythm. GI: Abdomen soft and nontender. No hepatomegaly. Vascular: No leg edema. Calves soft and nontender.   Lab Results:  Lab Results  Component Value Date   WBC 7.2 03/25/2016   HGB 13.6 03/25/2016   HCT 40.6 03/25/2016   MCV 91.0 03/25/2016   PLT 232 03/25/2016   NEUTROABS 6.0 03/25/2016    Imaging:  No results found.  Medications: I have reviewed the patient's current medications.  Assessment/Plan: 1. Limited stage (T0, N2, M0) small cell lung cancer presented with right mediastinal lymphadenopathy diagnosed in September 2017. Cycle 1 cisplatin/etoposide beginning 03/04/2016. Chest radiation initiated  03/19/2016.    Disposition: Michele [name] appears stable. She has completed 1 cycle of cisplatin/etoposide. She is currently completing a course of chest radiation. Plan to proceed with cycle 2 cisplatin/etoposide today as scheduled. Dr. Julien Nordmann recommends a restaging CT scan of the chest prior to cycle 3.  She had significant bone pain following cycle 1. This was likely related to Neulasta. The infusion nurse will review Claritin dosing instructions with her.  With regard to the intermittent dizziness Dr. Julien Nordmann recommends a referral to ENT. She reports a referral has already been made. Of note, brain MRI 03/02/2016 showed no evidence of metastatic disease.  She will return for a follow-up visit and cycle 3 cisplatin/etoposide 04/15/2016. She will contact the office in the interim with any problems.  Plan reviewed with Dr. Julien Nordmann.    Michele [name] ANP/GNP-BC   03/25/2016  11:14 AM

## 2016-03-25 NOTE — Progress Notes (Signed)
PAIN: She is currently in no pain. RESPIRATORY: Shortness of Breath  At Rest and Walking and Coughing  Dry. Occasionally is productive for thick white/slightly yellow sputum. Pt is on room air. Skin is warm, dry and intact over treatment field. SWALLOWING/DIET: Pt denies dysphagia. The patient eats a regular, healthy diet. OTHER: Pt complains of fatigue, weakness and loss of sleep. Pt feeling dizzy when standing, feels ill today.  Scheduled chemo for today and this week. BP (!) 107/59   Pulse 86   Temp 98.1 F (36.7 C) (Oral)   Resp 12   Wt 132 lb (59.9 kg)   BMI 21.97 kg/m  Wt Readings from Last 3 Encounters:  03/25/16 132 lb (59.9 kg)  03/22/16 133 lb (60.3 kg)  03/12/16 128 lb 4.8 oz (58.2 kg)   Orthostatic Standing VS: BP- 108/64, P- 91, Pox- 100%

## 2016-03-25 NOTE — Patient Instructions (Signed)
[facility] Discharge Instructions for Patients Receiving Chemotherapy  Today you received the following chemotherapy agents:  Cisplatin and Etoposide.  To help prevent nausea and vomiting after your treatment, we encourage you to take your nausea medication as directed.   If you develop nausea and vomiting that is not controlled by your nausea medication, call the clinic.   BELOW ARE SYMPTOMS THAT SHOULD BE REPORTED IMMEDIATELY:  *FEVER GREATER THAN 100.5 F  *CHILLS WITH OR WITHOUT FEVER  NAUSEA AND VOMITING THAT IS NOT CONTROLLED WITH YOUR NAUSEA MEDICATION  *UNUSUAL SHORTNESS OF BREATH  *UNUSUAL BRUISING OR BLEEDING  TENDERNESS IN MOUTH AND THROAT WITH OR WITHOUT PRESENCE OF ULCERS  *URINARY PROBLEMS  *BOWEL PROBLEMS  UNUSUAL RASH Items with * indicate a potential emergency and should be followed up as soon as possible.  Feel free to call the clinic you have any questions or concerns. The clinic phone number is [phone].  Please show the CHEMO ALERT CARD at check-in to the Emergency Department and triage nurse.   

## 2016-03-25 NOTE — Progress Notes (Signed)
At start of Cisplatin pt c/o of stinging in PIV, blood return noted from PIV and infusion rate slowed and not running Cisplatin fluids concurrently due to high volume and limited IV access. Pt has no complaints at this time of burning or stinging at PIV site just slight soreness. Pt educated to notify staff of any burning or stinging in PIV. Pt verbalizes understanding.

## 2016-03-26 ENCOUNTER — Ambulatory Visit: Payer: Medicare Other | Admitting: Nutrition

## 2016-03-26 ENCOUNTER — Ambulatory Visit
Admission: RE | Admit: 2016-03-26 | Discharge: 2016-03-26 | Disposition: A | Discharge status: Home / Self Care | Payer: Medicare Other | Source: Ambulatory Visit | Attending: Radiation Oncology | Admitting: Radiation Oncology

## 2016-03-26 ENCOUNTER — Ambulatory Visit: Payer: Medicare Other | Admitting: Nurse Practitioner

## 2016-03-26 ENCOUNTER — Ambulatory Visit (HOSPITAL_BASED_OUTPATIENT_CLINIC_OR_DEPARTMENT_OTHER): Payer: Medicare Other

## 2016-03-26 VITALS — BP 144/70 | HR 80 | Temp 98.0°F | Resp 18

## 2016-03-26 DIAGNOSIS — C771 Secondary and unspecified malignant neoplasm of intrathoracic lymph nodes: Secondary | ICD-10-CM | POA: Diagnosis not present

## 2016-03-26 DIAGNOSIS — Z5111 Encounter for antineoplastic chemotherapy: Secondary | ICD-10-CM

## 2016-03-26 DIAGNOSIS — C3491 Malignant neoplasm of unspecified part of right bronchus or lung: Secondary | ICD-10-CM

## 2016-03-26 DIAGNOSIS — C3490 Malignant neoplasm of unspecified part of unspecified bronchus or lung: Secondary | ICD-10-CM

## 2016-03-26 DIAGNOSIS — Z510 Encounter for antineoplastic radiation therapy: Secondary | ICD-10-CM | POA: Diagnosis not present

## 2016-03-26 DIAGNOSIS — R070 Pain in throat: Secondary | ICD-10-CM | POA: Diagnosis not present

## 2016-03-26 DIAGNOSIS — R05 Cough: Secondary | ICD-10-CM | POA: Diagnosis not present

## 2016-03-26 DIAGNOSIS — Z9221 Personal history of antineoplastic chemotherapy: Secondary | ICD-10-CM | POA: Diagnosis not present

## 2016-03-26 MED ORDER — SODIUM CHLORIDE 0.9 % IV SOLN
Freq: Once | INTRAVENOUS | Status: AC
  Administered 2016-03-26: 14:00:00 via INTRAVENOUS

## 2016-03-26 MED ORDER — DEXAMETHASONE SODIUM PHOSPHATE 10 MG/ML IJ SOLN
10.0000 mg | Freq: Once | INTRAMUSCULAR | Status: AC
  Administered 2016-03-26: 10 mg via INTRAVENOUS

## 2016-03-26 MED ORDER — SODIUM CHLORIDE 0.9 % IV SOLN
120.0000 mg/m2 | Freq: Once | INTRAVENOUS | Status: AC
  Administered 2016-03-26: 200 mg via INTRAVENOUS
  Filled 2016-03-26: qty 10

## 2016-03-26 MED ORDER — DEXAMETHASONE SODIUM PHOSPHATE 10 MG/ML IJ SOLN
INTRAMUSCULAR | Status: AC
  Filled 2016-03-26: qty 1

## 2016-03-26 NOTE — Patient Instructions (Signed)
[facility] Discharge Instructions for Patients Receiving Chemotherapy  Today you received the following chemotherapy agents: Etoposide   To help prevent nausea and vomiting after your treatment, we encourage you to take your nausea medication as directed.    If you develop nausea and vomiting that is not controlled by your nausea medication, call the clinic.   BELOW ARE SYMPTOMS THAT SHOULD BE REPORTED IMMEDIATELY:  *FEVER GREATER THAN 100.5 F  *CHILLS WITH OR WITHOUT FEVER  NAUSEA AND VOMITING THAT IS NOT CONTROLLED WITH YOUR NAUSEA MEDICATION  *UNUSUAL SHORTNESS OF BREATH  *UNUSUAL BRUISING OR BLEEDING  TENDERNESS IN MOUTH AND THROAT WITH OR WITHOUT PRESENCE OF ULCERS  *URINARY PROBLEMS  *BOWEL PROBLEMS  UNUSUAL RASH Items with * indicate a potential emergency and should be followed up as soon as possible.  Feel free to call the clinic you have any questions or concerns. The clinic phone number is [phone].  Please show the CHEMO ALERT CARD at check-in to the Emergency Department and triage nurse.   

## 2016-03-26 NOTE — Progress Notes (Signed)
60 year old female diagnosed with small cell lung cancer.  Past medical history includes tobacco, hypertension, hiatal hernia, GERD, depression, COPD, chronic kidney disease, and anxiety.  Medications include calcium with vitamin D, coenzyme every 10, Protonix, and MiraLAX.  Labs include glucose 209.  Height: 65 inches. Weight: 132 pounds. Usual body weight: 145-150 pounds. BMI: 21.97.  Patient requested nutrition consult to help her with food choices. She denies dysphasia. She is able to consume a regular diet without difficulty. Reports she has constipation today. States she does not cook but she does want a cookbook for recipe ideas. Reports family expects her to cook on a daily basis. Patient reports she does not have the money to buy prepared foods.  Nutrition diagnosis:  Unintended weight loss related to small cell lung cancer and associated treatments as evidenced by 9% weight loss from usual body weight.  Intervention: Provided multiple suggestions for patient on easier foods to prepare for both family and after chemotherapy. Provided cookbook for patient to review and take home. Encouraged patient to continue oral nutrition supplements during times when she is too tired to cook. Provided support and encouragement. Questions were answered.  Teach back method used.  Monitoring, evaluation, goals: Patient will tolerate adequate calories and protein to minimize loss of lean body mass.  Next visit: Patient will contact me with questions or concerns.  **Disclaimer: This note was dictated with voice recognition software. Similar sounding words can inadvertently be transcribed and this note may contain transcription errors which may not have been corrected upon publication of note.**

## 2016-03-27 ENCOUNTER — Ambulatory Visit
Admission: RE | Admit: 2016-03-27 | Discharge: 2016-03-27 | Disposition: A | Discharge status: Home / Self Care | Payer: Medicare Other | Source: Ambulatory Visit | Attending: Radiation Oncology | Admitting: Radiation Oncology

## 2016-03-27 ENCOUNTER — Ambulatory Visit (HOSPITAL_BASED_OUTPATIENT_CLINIC_OR_DEPARTMENT_OTHER): Payer: Medicare Other

## 2016-03-27 VITALS — BP 145/74 | HR 69 | Temp 98.6°F | Resp 18

## 2016-03-27 DIAGNOSIS — R070 Pain in throat: Secondary | ICD-10-CM | POA: Diagnosis not present

## 2016-03-27 DIAGNOSIS — C3490 Malignant neoplasm of unspecified part of unspecified bronchus or lung: Secondary | ICD-10-CM | POA: Diagnosis not present

## 2016-03-27 DIAGNOSIS — C3491 Malignant neoplasm of unspecified part of right bronchus or lung: Secondary | ICD-10-CM

## 2016-03-27 DIAGNOSIS — Z9221 Personal history of antineoplastic chemotherapy: Secondary | ICD-10-CM | POA: Diagnosis not present

## 2016-03-27 DIAGNOSIS — Z510 Encounter for antineoplastic radiation therapy: Secondary | ICD-10-CM | POA: Diagnosis not present

## 2016-03-27 DIAGNOSIS — R05 Cough: Secondary | ICD-10-CM | POA: Diagnosis not present

## 2016-03-27 DIAGNOSIS — C771 Secondary and unspecified malignant neoplasm of intrathoracic lymph nodes: Secondary | ICD-10-CM | POA: Diagnosis not present

## 2016-03-27 DIAGNOSIS — Z5111 Encounter for antineoplastic chemotherapy: Secondary | ICD-10-CM | POA: Diagnosis present

## 2016-03-27 MED ORDER — DEXAMETHASONE SODIUM PHOSPHATE 10 MG/ML IJ SOLN
INTRAMUSCULAR | Status: AC
  Filled 2016-03-27: qty 1

## 2016-03-27 MED ORDER — SODIUM CHLORIDE 0.9 % IV SOLN
Freq: Once | INTRAVENOUS | Status: AC
  Administered 2016-03-27: 14:00:00 via INTRAVENOUS

## 2016-03-27 MED ORDER — DEXAMETHASONE SODIUM PHOSPHATE 10 MG/ML IJ SOLN
5.0000 mg | Freq: Once | INTRAMUSCULAR | Status: AC
  Administered 2016-03-27: 5 mg via INTRAVENOUS

## 2016-03-27 MED ORDER — SODIUM CHLORIDE 0.9 % IV SOLN
120.0000 mg/m2 | Freq: Once | INTRAVENOUS | Status: AC
  Administered 2016-03-27: 200 mg via INTRAVENOUS
  Filled 2016-03-27: qty 10

## 2016-03-27 NOTE — Patient Instructions (Addendum)
[facility] Discharge Instructions for Patients Receiving Chemotherapy  Today you received the following chemotherapy agents: Etoposide   To help prevent nausea and vomiting after your treatment, we encourage you to take your nausea medication as directed.    If you develop nausea and vomiting that is not controlled by your nausea medication, call the clinic.   BELOW ARE SYMPTOMS THAT SHOULD BE REPORTED IMMEDIATELY:  *FEVER GREATER THAN 100.5 F  *CHILLS WITH OR WITHOUT FEVER  NAUSEA AND VOMITING THAT IS NOT CONTROLLED WITH YOUR NAUSEA MEDICATION  *UNUSUAL SHORTNESS OF BREATH  *UNUSUAL BRUISING OR BLEEDING  TENDERNESS IN MOUTH AND THROAT WITH OR WITHOUT PRESENCE OF ULCERS  *URINARY PROBLEMS  *BOWEL PROBLEMS  UNUSUAL RASH Items with * indicate a potential emergency and should be followed up as soon as possible.  Feel free to call the clinic you have any questions or concerns. The clinic phone number is [phone].  Please show the CHEMO ALERT CARD at check-in to the Emergency Department and triage nurse.   

## 2016-03-28 ENCOUNTER — Ambulatory Visit
Admission: RE | Admit: 2016-03-28 | Discharge: 2016-03-28 | Disposition: A | Discharge status: Home / Self Care | Payer: Medicare Other | Source: Ambulatory Visit | Attending: Radiation Oncology | Admitting: Radiation Oncology

## 2016-03-28 ENCOUNTER — Telehealth: Payer: Self-pay | Admitting: *Deleted

## 2016-03-28 DIAGNOSIS — R070 Pain in throat: Secondary | ICD-10-CM | POA: Diagnosis not present

## 2016-03-28 DIAGNOSIS — Z9221 Personal history of antineoplastic chemotherapy: Secondary | ICD-10-CM | POA: Diagnosis not present

## 2016-03-28 DIAGNOSIS — Z510 Encounter for antineoplastic radiation therapy: Secondary | ICD-10-CM | POA: Diagnosis not present

## 2016-03-28 DIAGNOSIS — R05 Cough: Secondary | ICD-10-CM | POA: Diagnosis not present

## 2016-03-28 DIAGNOSIS — C3491 Malignant neoplasm of unspecified part of right bronchus or lung: Secondary | ICD-10-CM | POA: Diagnosis not present

## 2016-03-28 DIAGNOSIS — C771 Secondary and unspecified malignant neoplasm of intrathoracic lymph nodes: Secondary | ICD-10-CM | POA: Diagnosis not present

## 2016-03-28 NOTE — Telephone Encounter (Signed)
Voicemail: "I received chemotherapy Mon, Tues, Wed.  I've gained weight up to 141 lbs.  I have fluid in my ankles.  I've been constipated since Sunday and Monday.  Radiation instructed me to take two senokot - S twice a day.  I already use Gwendolyn Lima.  Please call me at [phone].  This nurse called patient who reports: 1. "Last BM was Monday.  I am passing a little gas."  Advised she walk around her bedroom for a few minutes three times a day if she feels strong enough.  Drink hot beverages.  Let staff know tomorrow if senokot-S works. 2. "I asked RT to weigh me today I felt so bad and I've gained almost ten pounds in three days."  Elevate feet whenever possible and drink at least 64 ounce or more of water daily.  "I drink that.  I had a heart attack in 2013 so I thought I'm not supposed to drink too much water."  Denies any heart (pump) failure or ever being placed on fluid restrictions.  Advised she 64 oz water and beverages trying hot beverages and broth, cut back on salt  3. "I looked at myself after I learned of the weight gain and both ankles are swollen, my arms are swollen, my face is swollen.  I'm retaining fluid."  Received IVF three consecutive days.  "I've never gained this much weight just from IV fluids."

## 2016-03-29 ENCOUNTER — Ambulatory Visit: Payer: Medicare Other

## 2016-03-29 ENCOUNTER — Telehealth: Payer: Self-pay | Admitting: *Deleted

## 2016-03-29 ENCOUNTER — Ambulatory Visit
Admission: RE | Admit: 2016-03-29 | Discharge: 2016-03-29 | Disposition: A | Discharge status: Home / Self Care | Payer: Medicare Other | Source: Ambulatory Visit | Attending: Radiation Oncology | Admitting: Radiation Oncology

## 2016-03-29 ENCOUNTER — Ambulatory Visit (HOSPITAL_BASED_OUTPATIENT_CLINIC_OR_DEPARTMENT_OTHER): Payer: Medicare Other | Admitting: Nurse Practitioner

## 2016-03-29 ENCOUNTER — Ambulatory Visit (HOSPITAL_BASED_OUTPATIENT_CLINIC_OR_DEPARTMENT_OTHER): Payer: Medicare Other

## 2016-03-29 ENCOUNTER — Other Ambulatory Visit: Payer: Self-pay

## 2016-03-29 ENCOUNTER — Encounter: Payer: Self-pay | Admitting: Nurse Practitioner

## 2016-03-29 VITALS — BP 155/63 | HR 71 | Temp 97.8°F | Resp 20 | Wt 134.8 lb

## 2016-03-29 DIAGNOSIS — K5900 Constipation, unspecified: Secondary | ICD-10-CM | POA: Diagnosis not present

## 2016-03-29 DIAGNOSIS — Z510 Encounter for antineoplastic radiation therapy: Secondary | ICD-10-CM | POA: Diagnosis not present

## 2016-03-29 DIAGNOSIS — G893 Neoplasm related pain (acute) (chronic): Secondary | ICD-10-CM | POA: Diagnosis not present

## 2016-03-29 DIAGNOSIS — E860 Dehydration: Secondary | ICD-10-CM | POA: Insufficient documentation

## 2016-03-29 DIAGNOSIS — C771 Secondary and unspecified malignant neoplasm of intrathoracic lymph nodes: Secondary | ICD-10-CM | POA: Diagnosis not present

## 2016-03-29 DIAGNOSIS — C3490 Malignant neoplasm of unspecified part of unspecified bronchus or lung: Secondary | ICD-10-CM

## 2016-03-29 DIAGNOSIS — M549 Dorsalgia, unspecified: Secondary | ICD-10-CM

## 2016-03-29 DIAGNOSIS — C3491 Malignant neoplasm of unspecified part of right bronchus or lung: Secondary | ICD-10-CM | POA: Diagnosis not present

## 2016-03-29 DIAGNOSIS — R05 Cough: Secondary | ICD-10-CM | POA: Diagnosis not present

## 2016-03-29 DIAGNOSIS — Z9221 Personal history of antineoplastic chemotherapy: Secondary | ICD-10-CM | POA: Diagnosis not present

## 2016-03-29 DIAGNOSIS — R070 Pain in throat: Secondary | ICD-10-CM | POA: Diagnosis not present

## 2016-03-29 MED ORDER — PEGFILGRASTIM INJECTION 6 MG/0.6ML ~~LOC~~
6.0000 mg | PREFILLED_SYRINGE | Freq: Once | SUBCUTANEOUS | Status: AC
  Administered 2016-03-29: 6 mg via SUBCUTANEOUS
  Filled 2016-03-29: qty 0.6

## 2016-03-29 MED ORDER — MORPHINE SULFATE 4 MG/ML IJ SOLN
2.0000 mg | Freq: Once | INTRAMUSCULAR | Status: AC
Start: 2016-03-29 — End: 2016-03-29
  Administered 2016-03-29: 2 mg via INTRAVENOUS
  Filled 2016-03-29: qty 1

## 2016-03-29 MED ORDER — MORPHINE SULFATE (PF) 4 MG/ML IV SOLN
INTRAVENOUS | Status: AC
  Filled 2016-03-29: qty 1

## 2016-03-29 MED ORDER — SODIUM CHLORIDE 0.9 % IV SOLN
INTRAVENOUS | Status: AC
Start: 2016-03-29 — End: 2016-03-29
  Administered 2016-03-29: 15:00:00 via INTRAVENOUS

## 2016-03-29 MED ORDER — HYDROCODONE-ACETAMINOPHEN 5-325 MG PO TABS: 1.0000 | ORAL_TABLET | Freq: Four times a day (QID) | ORAL | 0 refills | Status: DC | PRN

## 2016-03-29 MED ORDER — ONDANSETRON 8 MG PO TBDP
8.0000 mg | ORAL_TABLET | Freq: Once | ORAL | Status: AC
  Administered 2016-03-29: 8 mg via ORAL
  Filled 2016-03-29: qty 1

## 2016-03-29 MED ORDER — MORPHINE SULFATE 4 MG/ML IJ SOLN
2.0000 mg | Freq: Once | INTRAMUSCULAR | Status: AC
  Administered 2016-03-29: 2 mg via INTRAVENOUS
  Filled 2016-03-29: qty 1

## 2016-03-29 NOTE — Assessment & Plan Note (Signed)
Patient has had decreased appetite and poor oral intake.  She feels dehydrated today.  She also feels a little achy; and complains of some cramping in her bilateral lower extremities.  She is requesting IV fluid rehydration.  Patient will receive IV fluids while the cancer Center today.  She was also encouraged to push fluids at home is much as possible.

## 2016-03-29 NOTE — Patient Instructions (Signed)
Dehydration, Adult Dehydration means your body does not have as much fluid or water as it needs. It happens when you take in less fluid than you lose. Your kidneys, brain, and heart will not work properly without the right amount of fluids.  Dehydration can range from mild to severe. It should be treated right away to help prevent it from becoming severe. HOME CARE  Drink enough fluid to keep your pee (urine) clear or pale yellow.  Drink water or fluid slowly by taking small sips. You can also try sucking on ice cubes.  Have food or drinks that contain electrolytes. Examples include bananas and sports drinks.  Take over-the-counter and prescription medicines only as told by your doctor.  Prepare oral rehydration solution (ORS) according to the instructions that came with it. Take sips of ORS every 5 minutes until your pee returns to normal.  If you are throwing up (vomiting) or have watery poop (diarrhea), keep trying to drink water, ORS, or both.  If you have watery poop, avoid:  Drinks with caffeine.  Fruit juice.  Milk.  Carbonated soft drinks.  Do not take salt tablets. This can lead to having too much sodium in your body (hypernatremia). GET HELP IF:  You cannot eat or drink without throwing up.  You have had mild watery poop for longer than 24 hours.  You have a fever. GET HELP RIGHT AWAY IF:   You have very strong thirst.  You have very bad watery poop.  You have not peed in 6-8 hours, or you have peed only a small amount of very dark pee.  You have shriveled skin.  You are dizzy, confused, or both.   This information is not intended to replace advice given to you by your health care provider. Make sure you discuss any questions you have with your health care provider.   Document Released: 03/16/2009 Document Revised: 02/08/2015 Document Reviewed: 10/05/2014 Elsevier Interactive Patient Education 2016 Elsevier Inc.  

## 2016-03-29 NOTE — Assessment & Plan Note (Signed)
Patient received cycle 2, day 1 of her cisplatin/etoposide chemotherapy regimen on 03/25/2016.  She received her Neulasta injection earlier today.  She also continues with daily radiation treatments as well.  See further notes for details of today's visit.  Patient is scheduled to return for labs and a brain MRI on 04/01/2016.  She is scheduled for labs only on 04/08/2016.  She is scheduled for labs, visit, and her next cycle of chemotherapy on 04/15/2016.

## 2016-03-29 NOTE — Assessment & Plan Note (Signed)
Patient states that she has been suffering with some chronic constipation.  She has been taking senna S for the last few days; and is also been taking some intermittent, Corrin Parker as well.  She states she only had 3 large bowel movements earlier today; and she is now complaining of her abdomen feeling crampy.  Patient denies any recent fevers or chills.  Exam today reveals abdomen soft and nontender.  Bowel sounds positive in all 4 quads.  Advised patient that her abdominal cramping is most likely secondary to the laxative she's been taking recently.  Patient was advised to clear all constipation; and then to remain on a bowel regimen to avoid future constipation issues.  Also, patient was advised to go directly to the emergency department over the weekend with any new or worsening symptoms whatsoever.
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
## 2016-03-29 NOTE — Patient Instructions (Signed)
Pegfilgrastim injection What is this medicine? PEGFILGRASTIM (PEG fil gra stim) is a long-acting granulocyte colony-stimulating factor that stimulates the growth of neutrophils, a type of white blood cell important in the body's fight against infection. It is used to reduce the incidence of fever and infection in patients with certain types of cancer who are receiving chemotherapy that affects the bone marrow, and to increase survival after being exposed to high doses of radiation. This medicine may be used for other purposes; ask your health care provider or pharmacist if you have questions. What should I tell my health care provider before I take this medicine? They need to know if you have any of these conditions: -kidney disease -latex allergy -ongoing radiation therapy -sickle cell disease -skin reactions to acrylic adhesives (On-Body Injector only) -an unusual or allergic reaction to pegfilgrastim, filgrastim, other medicines, foods, dyes, or preservatives -pregnant or trying to get pregnant -breast-feeding How should I use this medicine? This medicine is for injection under the skin. If you get this medicine at home, you will be taught how to prepare and give the pre-filled syringe or how to use the On-body Injector. Refer to the patient Instructions for Use for detailed instructions. Use exactly as directed. Take your medicine at regular intervals. Do not take your medicine more often than directed. It is important that you put your used needles and syringes in a special sharps container. Do not put them in a trash can. If you do not have a sharps container, call your pharmacist or healthcare provider to get one. Talk to your pediatrician regarding the use of this medicine in children. While this drug may be prescribed for selected conditions, precautions do apply. Overdosage: If you think you have taken too much of this medicine contact a poison control center or emergency room at  once. NOTE: This medicine is only for you. Do not share this medicine with others. What if I miss a dose? It is important not to miss your dose. Call your doctor or health care professional if you miss your dose. If you miss a dose due to an On-body Injector failure or leakage, a new dose should be administered as soon as possible using a single prefilled syringe for manual use. What may interact with this medicine? Interactions have not been studied. Give your health care provider a list of all the medicines, herbs, non-prescription drugs, or dietary supplements you use. Also tell them if you smoke, drink alcohol, or use illegal drugs. Some items may interact with your medicine. This list may not describe all possible interactions. Give your health care provider a list of all the medicines, herbs, non-prescription drugs, or dietary supplements you use. Also tell them if you smoke, drink alcohol, or use illegal drugs. Some items may interact with your medicine. What should I watch for while using this medicine? You may need blood work done while you are taking this medicine. If you are going to need a MRI, CT scan, or other procedure, tell your doctor that you are using this medicine (On-Body Injector only). What side effects may I notice from receiving this medicine? Side effects that you should report to your doctor or health care professional as soon as possible: -allergic reactions like skin rash, itching or hives, swelling of the face, lips, or tongue -dizziness -fever -pain, redness, or irritation at site where injected -pinpoint red spots on the skin -red or dark-brown urine -shortness of breath or breathing problems -stomach or side pain, or pain   at the shoulder -swelling -tiredness -trouble passing urine or change in the amount of urine Side effects that usually do not require medical attention (report to your doctor or health care professional if they continue or are  bothersome): -bone pain -muscle pain This list may not describe all possible side effects. Call your doctor for medical advice about side effects. You may report side effects to FDA at 1-800-FDA-1088. Where should I keep my medicine? Keep out of the reach of children. Store pre-filled syringes in a refrigerator between 2 and 8 degrees C (36 and 46 degrees F). Do not freeze. Keep in carton to protect from light. Throw away this medicine if it is left out of the refrigerator for more than 48 hours. Throw away any unused medicine after the expiration date. NOTE: This sheet is a summary. It may not cover all possible information. If you have questions about this medicine, talk to your doctor, pharmacist, or health care provider.    2016, Elsevier/Gold Standard. (2014-06-09 14:30:14)  

## 2016-03-29 NOTE — Assessment & Plan Note (Signed)
Patient states that she tries to avoid all narcotic pain medication; so she is worried about constipation.  However, patient is complaining of some generalized achiness and chronic back pain.  Long discussion with both patient and her husband regarding the use of pain medication and the need for a regular bowel regimen.  Patient will be prescribed a Vicodin refill.  She was advised to take only one of the Vicodin tablets at a time if this controls her pain.  She may take 1-2 of the Vicodin tablets on every 6 hour basis as needed.  She was also encouraged not to mix the pain medication with any of her sleeping medications.

## 2016-03-29 NOTE — Telephone Encounter (Signed)
Oncology Nurse Navigator Documentation  Oncology Nurse Navigator Flowsheets 03/29/2016  Referral date to RadOnc/MedOnc -  Navigator Encounter Type Telephone/patient called with complaints of constipation. I called and spoke with her today.  I listened as she explained how she was feeling. She stated she is had a BM today.  She states she feels much better.  She spoke about her treatment and she was feeling tired.  I updated her to expect feelings of fatigue with her treatment. I explained energy conservation with frequent rest periods through out the day.    Telephone Outgoing Call  Treatment Phase Treatment  Barriers/Navigation Needs Education  Education Pain/ Symptom Management  Interventions Education  Education Method Verbal  Acuity Level 2  Acuity Level 2 Educational needs  Time Spent with Patient 30

## 2016-03-30 ENCOUNTER — Other Ambulatory Visit: Payer: Self-pay

## 2016-03-30 ENCOUNTER — Emergency Department (HOSPITAL_COMMUNITY)
Admission: EM | Admit: 2016-03-30 | Discharge: 2016-03-30 | Disposition: A | Discharge status: Home / Self Care | Payer: Medicare Other | Attending: Emergency Medicine | Admitting: Emergency Medicine

## 2016-03-30 ENCOUNTER — Encounter (HOSPITAL_COMMUNITY): Payer: Self-pay | Admitting: *Deleted

## 2016-03-30 ENCOUNTER — Emergency Department (HOSPITAL_COMMUNITY): Payer: Medicare Other

## 2016-03-30 DIAGNOSIS — R300 Dysuria: Secondary | ICD-10-CM | POA: Diagnosis present

## 2016-03-30 DIAGNOSIS — I10 Essential (primary) hypertension: Secondary | ICD-10-CM | POA: Diagnosis not present

## 2016-03-30 DIAGNOSIS — C3491 Malignant neoplasm of unspecified part of right bronchus or lung: Secondary | ICD-10-CM

## 2016-03-30 DIAGNOSIS — R0602 Shortness of breath: Secondary | ICD-10-CM | POA: Diagnosis not present

## 2016-03-30 DIAGNOSIS — Z79899 Other long term (current) drug therapy: Secondary | ICD-10-CM | POA: Insufficient documentation

## 2016-03-30 DIAGNOSIS — Z87891 Personal history of nicotine dependence: Secondary | ICD-10-CM | POA: Diagnosis not present

## 2016-03-30 DIAGNOSIS — J449 Chronic obstructive pulmonary disease, unspecified: Secondary | ICD-10-CM | POA: Insufficient documentation

## 2016-03-30 DIAGNOSIS — J9811 Atelectasis: Secondary | ICD-10-CM | POA: Diagnosis not present

## 2016-03-30 DIAGNOSIS — J45909 Unspecified asthma, uncomplicated: Secondary | ICD-10-CM | POA: Insufficient documentation

## 2016-03-30 DIAGNOSIS — D0221 Carcinoma in situ of right bronchus and lung: Secondary | ICD-10-CM | POA: Diagnosis not present

## 2016-03-30 DIAGNOSIS — I2510 Atherosclerotic heart disease of native coronary artery without angina pectoris: Secondary | ICD-10-CM | POA: Insufficient documentation

## 2016-03-30 DIAGNOSIS — C799 Secondary malignant neoplasm of unspecified site: Secondary | ICD-10-CM | POA: Diagnosis not present

## 2016-03-30 LAB — BASIC METABOLIC PANEL
Anion gap: 6 (ref 5–15)
BUN: 11 mg/dL (ref 6–20)
CO2: 28 mmol/L (ref 22–32)
Calcium: 9 mg/dL (ref 8.9–10.3)
Chloride: 104 mmol/L (ref 101–111)
Creatinine, Ser: 0.47 mg/dL (ref 0.44–1.00)
GFR calc Af Amer: >60 mL/min (ref >60)
GFR calc non Af Amer: >60 mL/min (ref >60)
Glucose, Bld: 156 mg/dL — ABNORMAL HIGH (ref 65–99)
Potassium: 3.8 mmol/L (ref 3.5–5.1)
Sodium: 138 mmol/L (ref 135–145)

## 2016-03-30 LAB — CBC WITH DIFFERENTIAL/PLATELET
Basophils Absolute: 0 10*3/uL (ref 0.0–0.1)
Basophils Relative: 0 %
Eosinophils Absolute: 0 10*3/uL (ref 0.0–0.7)
Eosinophils Relative: 0 %
HCT: 35.7 % — ABNORMAL LOW (ref 36.0–46.0)
Hemoglobin: 11.9 g/dL — ABNORMAL LOW (ref 12.0–15.0)
Lymphocytes Relative: 1 %
Lymphs Abs: 0.3 10*3/uL — ABNORMAL LOW (ref 0.7–4.0)
MCH: 30.9 pg (ref 26.0–34.0)
MCHC: 33.3 g/dL (ref 30.0–36.0)
MCV: 92.7 fL (ref 78.0–100.0)
Monocytes Absolute: 0 10*3/uL — ABNORMAL LOW (ref 0.1–1.0)
Monocytes Relative: 0 %
Neutro Abs: 33.5 10*3/uL — ABNORMAL HIGH (ref 1.7–7.7)
Neutrophils Relative %: 99 %
Platelets: 270 10*3/uL (ref 150–400)
RBC: 3.85 MIL/uL — ABNORMAL LOW (ref 3.87–5.11)
RDW: 14.6 % (ref 11.5–15.5)
WBC Morphology: INCREASED
WBC: 33.8 10*3/uL — ABNORMAL HIGH (ref 4.0–10.5)

## 2016-03-30 LAB — URINALYSIS, ROUTINE W REFLEX MICROSCOPIC
Bilirubin Urine: NEGATIVE
Glucose, UA: 100 mg/dL — AB
Hgb urine dipstick: NEGATIVE
Ketones, ur: NEGATIVE mg/dL
Leukocytes, UA: NEGATIVE
Nitrite: NEGATIVE
Protein, ur: NEGATIVE mg/dL
Specific Gravity, Urine: 1.019 (ref 1.005–1.030)
pH: 7 (ref 5.0–8.0)

## 2016-03-30 LAB — TROPONIN I: Troponin I: <0.03 ng/mL (ref <0.03)

## 2016-03-30 MED ORDER — SODIUM CHLORIDE 0.9 % IV BOLUS (SEPSIS): 1000.0000 mL | Freq: Once | INTRAVENOUS | Status: DC

## 2016-03-30 NOTE — ED Notes (Signed)
Family at bedside. 

## 2016-03-30 NOTE — Discharge Instructions (Signed)
Tests show no life threatening condition.  I discussed your case with Dr Earlie Server.  Follow-up with your oncologist next week.
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
## 2016-03-30 NOTE — ED Notes (Signed)
Pt states she has had a hard time breathing heart rate has been going way up. She has burning with urination and blood in bowel. "Looks like pumpkins seeds come out of urine."

## 2016-03-30 NOTE — ED Triage Notes (Signed)
Pt BIB EMS, for  SOB. H/o of small lung cell Ca. She was seen yesterday at the [facility] on yesterday. On yesterday she began to develop abdominal pain and notice bright red blood per rectum when she wipes and dark colored urine. Pt states she has hemorrhoids and h/o UTI.

## 2016-04-01 ENCOUNTER — Telehealth: Payer: Self-pay

## 2016-04-01 ENCOUNTER — Ambulatory Visit (HOSPITAL_COMMUNITY)
Admission: RE | Admit: 2016-04-01 | Discharge: 2016-04-01 | Disposition: A | Discharge status: Home / Self Care | Payer: Medicare Other | Source: Ambulatory Visit | Attending: Radiation Oncology | Admitting: Radiation Oncology

## 2016-04-01 ENCOUNTER — Encounter: Payer: Self-pay | Admitting: Radiation Oncology

## 2016-04-01 ENCOUNTER — Other Ambulatory Visit (HOSPITAL_BASED_OUTPATIENT_CLINIC_OR_DEPARTMENT_OTHER): Payer: Medicare Other

## 2016-04-01 ENCOUNTER — Ambulatory Visit
Admission: RE | Admit: 2016-04-01 | Discharge: 2016-04-01 | Disposition: A | Discharge status: Home / Self Care | Payer: Medicare Other | Source: Ambulatory Visit | Attending: Radiation Oncology | Admitting: Radiation Oncology

## 2016-04-01 ENCOUNTER — Other Ambulatory Visit: Payer: Self-pay | Admitting: Radiation Oncology

## 2016-04-01 VITALS — BP 116/72 | HR 75 | Temp 97.8°F | Wt 132.2 lb

## 2016-04-01 DIAGNOSIS — C3490 Malignant neoplasm of unspecified part of unspecified bronchus or lung: Secondary | ICD-10-CM | POA: Diagnosis not present

## 2016-04-01 DIAGNOSIS — C771 Secondary and unspecified malignant neoplasm of intrathoracic lymph nodes: Secondary | ICD-10-CM

## 2016-04-01 DIAGNOSIS — H9319 Tinnitus, unspecified ear: Secondary | ICD-10-CM | POA: Diagnosis not present

## 2016-04-01 DIAGNOSIS — R05 Cough: Secondary | ICD-10-CM | POA: Diagnosis not present

## 2016-04-01 DIAGNOSIS — Z9221 Personal history of antineoplastic chemotherapy: Secondary | ICD-10-CM | POA: Diagnosis not present

## 2016-04-01 DIAGNOSIS — R070 Pain in throat: Secondary | ICD-10-CM | POA: Diagnosis not present

## 2016-04-01 DIAGNOSIS — Z510 Encounter for antineoplastic radiation therapy: Secondary | ICD-10-CM | POA: Diagnosis not present

## 2016-04-01 DIAGNOSIS — C3491 Malignant neoplasm of unspecified part of right bronchus or lung: Secondary | ICD-10-CM | POA: Diagnosis not present

## 2016-04-01 LAB — CBC WITH DIFFERENTIAL/PLATELET
BASO%: 0.1 % (ref 0.0–2.0)
Basophils Absolute: 0 10*3/uL (ref 0.0–0.1)
EOS%: 0.1 % (ref 0.0–7.0)
Eosinophils Absolute: 0 10*3/uL (ref 0.0–0.5)
HCT: 33.1 % — ABNORMAL LOW (ref 34.8–46.6)
HGB: 11.1 g/dL — ABNORMAL LOW (ref 11.6–15.9)
LYMPH%: 8.3 % — ABNORMAL LOW (ref 14.0–49.7)
MCH: 30.6 pg (ref 25.1–34.0)
MCHC: 33.5 g/dL (ref 31.5–36.0)
MCV: 91.2 fL (ref 79.5–101.0)
MONO#: 0.3 10*3/uL (ref 0.1–0.9)
MONO%: 4.3 % (ref 0.0–14.0)
NEUT#: 6.9 10*3/uL — ABNORMAL HIGH (ref 1.5–6.5)
NEUT%: 87.2 % — ABNORMAL HIGH (ref 38.4–76.8)
Platelets: 172 10*3/uL (ref 145–400)
RBC: 3.63 10*6/uL — ABNORMAL LOW (ref 3.70–5.45)
RDW: 14.3 % (ref 11.2–14.5)
WBC: 7.9 10*3/uL (ref 3.9–10.3)
lymph#: 0.7 10*3/uL — ABNORMAL LOW (ref 0.9–3.3)

## 2016-04-01 LAB — COMPREHENSIVE METABOLIC PANEL
ALT: 17 U/L (ref 0–55)
AST: 26 U/L (ref 5–34)
Albumin: 3.4 g/dL — ABNORMAL LOW (ref 3.5–5.0)
Alkaline Phosphatase: 75 U/L (ref 40–150)
Anion Gap: 7 mEq/L (ref 3–11)
BUN: 17.6 mg/dL (ref 7.0–26.0)
CO2: 28 mEq/L (ref 22–29)
Calcium: 9.2 mg/dL (ref 8.4–10.4)
Chloride: 107 mEq/L (ref 98–109)
Creatinine: 0.6 mg/dL (ref 0.6–1.1)
EGFR: >90 mL/min/{1.73_m2} (ref >90)
Glucose: 97 mg/dl (ref 70–140)
Potassium: 4 mEq/L (ref 3.5–5.1)
Sodium: 142 mEq/L (ref 136–145)
Total Bilirubin: 0.46 mg/dL (ref 0.20–1.20)
Total Protein: 5.9 g/dL — ABNORMAL LOW (ref 6.4–8.3)

## 2016-04-01 LAB — MAGNESIUM: Magnesium: 2.1 mg/dl (ref 1.5–2.5)

## 2016-04-01 MED ORDER — SUCRALFATE 1 G PO TABS: ORAL_TABLET | ORAL | 5 refills | Status: DC

## 2016-04-01 MED ORDER — GADOBENATE DIMEGLUMINE 529 MG/ML IV SOLN
13.0000 mL | Freq: Once | INTRAVENOUS | Status: AC | PRN
  Administered 2016-04-01: 13 mL via INTRAVENOUS

## 2016-04-01 NOTE — Progress Notes (Signed)
   Weekly Management Note:  outpatient    ICD-9-CM ICD-10-CM   1. 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  CBCT/MVCT images/Port film x-rays were reviewed.  The chart was checked. Michele [name] presents for her 10th fraction of radiation to her Mediastinum. She denies pain. She reports feeling weak, and tired. She presented to the Emergency Room over the past weekend for an increased heart rate. She feels better at this time, and reports they gave her IV Fluids. Screening tests including EKG, chest x-ray, troponin all negative.  She has labs ordered to be drawn, and has an MRI brain planned for this afternoon.  She is using the Sonafine cream "when she thinks of it". She has a decreased appetite. She reports constipation with no bowel movement for 3 days. She is taking daily miralax and 1 senna-s twice daily. She reports improved dizzyness when laying down. She does have some when laying down for radiation.   BP 116/72   Pulse 75   Temp 97.8 F (36.6 C)   Wt 132 lb 3.2 oz (60 kg)   SpO2 100% Comment: room air  BMI 22.00 kg/m    Orthostatics: sitting BP 91/54, pulse 75. Standing BP 103/54, pulse 83.   Physical Findings:  Wt Readings from Last 3 Encounters:  04/01/16 132 lb 3.2 oz (60 kg)  03/29/16 134 lb 12.8 oz (61.1 kg)  03/25/16 132 lb (59.9 kg)    weight is 132 lb 3.2 oz (60 kg). Her temperature is 97.8 F (36.6 C). Her blood pressure is 116/72 and her pulse is 75. Her oxygen saturation is 100%.  NAD, ambulatory  CBC    Component Value Date/Time   WBC 7.9 04/01/2016 1142   WBC 33.8 (H) 03/30/2016 1100   RBC 3.63 (L) 04/01/2016 1142   RBC 3.85 (L) 03/30/2016 1100   HGB 11.1 (L) 04/01/2016 1142   HCT 33.1 (L) 04/01/2016 1142   PLT 172 04/01/2016 1142   MCV 91.2 04/01/2016 1142   MCH 30.6 04/01/2016 1142   MCH 30.9 03/30/2016  1100   MCHC 33.5 04/01/2016 1142   MCHC 33.3 03/30/2016 1100   RDW 14.3 04/01/2016 1142   LYMPHSABS 0.7 (L) 04/01/2016 1142   MONOABS 0.3 04/01/2016 1142   EOSABS 0.0 04/01/2016 1142   BASOSABS 0.0 04/01/2016 1142     CMP     Component Value Date/Time   NA 142 04/01/2016 1142   K 4.0 04/01/2016 1142   CL 104 03/30/2016 1100   CO2 28 04/01/2016 1142   GLUCOSE 97 04/01/2016 1142   BUN 17.6 04/01/2016 1142   CREATININE 0.6 04/01/2016 1142   CALCIUM 9.2 04/01/2016 1142   PROT 5.9 (L) 04/01/2016 1142   ALBUMIN 3.4 (L) 04/01/2016 1142   AST 26 04/01/2016 1142   ALT 17 04/01/2016 1142   ALKPHOS 75 04/01/2016 1142   BILITOT 0.46 04/01/2016 1142   GFRNONAA >60 03/30/2016 1100   GFRAA >60 03/30/2016 1100     Impression:  The patient is tolerating radiotherapy   Plan:  Continue radiotherapy as planned.   MRI brain today  Pt reports constipation and reports med/onc advised against enema. Take miralax and sennakot BID .Marland KitchenMarland Kitchen  If needed,  try mag citrate tomorrow   Sucralfate PRN esophagitis -----------------------------------  [name], MD

## 2016-04-01 NOTE — Progress Notes (Signed)
Michele [name] presents for her 10th fraction of radiation to her Mediastinum. She denies pain. She reports feeling weak, and tired. She presented to the Emergency Room over the past weekend for an increased heart rate. She feels better at this time, and reports they gave her IV Fluids. She has labs ordered to be drawn, and has an MRI planned for this afternoon. She reports slight redness to her Radiation site. She is using the Sonafine cream "when she thinks of it". She has a decreased appetite. She reports constipation with no bowel movement for 3 days. She is taking daily miralax and 1 senna-s twice daily. She reports improved dizzyness when laying down. She does have some when laying down for radiation.   BP 116/72   Pulse 75   Temp 97.8 F (36.6 C)   Wt 132 lb 3.2 oz (60 kg)   SpO2 100% Comment: room air  BMI 22.00 kg/m    Wt Readings from Last 3 Encounters:  04/01/16 132 lb 3.2 oz (60 kg)  03/29/16 134 lb 12.8 oz (61.1 kg)  03/25/16 132 lb (59.9 kg)

## 2016-04-01 NOTE — Telephone Encounter (Signed)
I called and spoke to Michele [name]. I informed her that the MRI she had done today showed no signs of cancer or concerning explanation for her symptoms. She voiced her appreciation for the phone call. She was also concerned about ongoing constipation. She is taking miralax twice daily and one Senna-S twice daily. I suggested she increase her senna-S tonight to two pills. She may want to try Mag Citrate in the morning after radiation if she is still unable to have a bowel movement. She knows to call me if she has any further questions.

## 2016-04-02 ENCOUNTER — Ambulatory Visit
Admission: RE | Admit: 2016-04-02 | Discharge: 2016-04-02 | Disposition: A | Discharge status: Home / Self Care | Payer: Medicare Other | Source: Ambulatory Visit | Attending: Radiation Oncology | Admitting: Radiation Oncology

## 2016-04-02 DIAGNOSIS — C771 Secondary and unspecified malignant neoplasm of intrathoracic lymph nodes: Secondary | ICD-10-CM | POA: Diagnosis not present

## 2016-04-02 DIAGNOSIS — C3491 Malignant neoplasm of unspecified part of right bronchus or lung: Secondary | ICD-10-CM | POA: Diagnosis not present

## 2016-04-02 DIAGNOSIS — R070 Pain in throat: Secondary | ICD-10-CM | POA: Diagnosis not present

## 2016-04-02 DIAGNOSIS — Z9221 Personal history of antineoplastic chemotherapy: Secondary | ICD-10-CM | POA: Diagnosis not present

## 2016-04-02 DIAGNOSIS — R05 Cough: Secondary | ICD-10-CM | POA: Diagnosis not present

## 2016-04-02 DIAGNOSIS — Z510 Encounter for antineoplastic radiation therapy: Secondary | ICD-10-CM | POA: Diagnosis not present

## 2016-04-03 ENCOUNTER — Ambulatory Visit
Admission: RE | Admit: 2016-04-03 | Discharge: 2016-04-03 | Disposition: A | Discharge status: Home / Self Care | Payer: Medicare Other | Source: Ambulatory Visit | Attending: Radiation Oncology | Admitting: Radiation Oncology

## 2016-04-03 DIAGNOSIS — C3491 Malignant neoplasm of unspecified part of right bronchus or lung: Secondary | ICD-10-CM | POA: Diagnosis not present

## 2016-04-03 DIAGNOSIS — R070 Pain in throat: Secondary | ICD-10-CM | POA: Diagnosis not present

## 2016-04-03 DIAGNOSIS — C771 Secondary and unspecified malignant neoplasm of intrathoracic lymph nodes: Secondary | ICD-10-CM | POA: Diagnosis not present

## 2016-04-03 DIAGNOSIS — Z9221 Personal history of antineoplastic chemotherapy: Secondary | ICD-10-CM | POA: Diagnosis not present

## 2016-04-03 DIAGNOSIS — Z510 Encounter for antineoplastic radiation therapy: Secondary | ICD-10-CM | POA: Diagnosis not present

## 2016-04-03 DIAGNOSIS — R05 Cough: Secondary | ICD-10-CM | POA: Diagnosis not present

## 2016-04-04 ENCOUNTER — Encounter: Payer: Self-pay | Admitting: Radiation Oncology

## 2016-04-04 ENCOUNTER — Ambulatory Visit
Admission: RE | Admit: 2016-04-04 | Discharge: 2016-04-04 | Disposition: A | Discharge status: Home / Self Care | Payer: Medicare Other | Source: Ambulatory Visit | Attending: Radiation Oncology | Admitting: Radiation Oncology

## 2016-04-04 VITALS — BP 96/58 | HR 86 | Temp 98.2°F | Resp 16 | Ht 65.0 in | Wt 131.8 lb

## 2016-04-04 DIAGNOSIS — Z9221 Personal history of antineoplastic chemotherapy: Secondary | ICD-10-CM | POA: Diagnosis not present

## 2016-04-04 DIAGNOSIS — C3490 Malignant neoplasm of unspecified part of unspecified bronchus or lung: Secondary | ICD-10-CM | POA: Diagnosis not present

## 2016-04-04 DIAGNOSIS — R070 Pain in throat: Secondary | ICD-10-CM | POA: Diagnosis not present

## 2016-04-04 DIAGNOSIS — C771 Secondary and unspecified malignant neoplasm of intrathoracic lymph nodes: Secondary | ICD-10-CM

## 2016-04-04 DIAGNOSIS — H6122 Impacted cerumen, left ear: Secondary | ICD-10-CM | POA: Diagnosis not present

## 2016-04-04 DIAGNOSIS — C3491 Malignant neoplasm of unspecified part of right bronchus or lung: Secondary | ICD-10-CM | POA: Diagnosis not present

## 2016-04-04 DIAGNOSIS — H9313 Tinnitus, bilateral: Secondary | ICD-10-CM | POA: Diagnosis not present

## 2016-04-04 DIAGNOSIS — Z86018 Personal history of other benign neoplasm: Secondary | ICD-10-CM | POA: Diagnosis not present

## 2016-04-04 DIAGNOSIS — Z510 Encounter for antineoplastic radiation therapy: Secondary | ICD-10-CM | POA: Diagnosis not present

## 2016-04-04 DIAGNOSIS — R05 Cough: Secondary | ICD-10-CM | POA: Diagnosis not present

## 2016-04-04 NOTE — Progress Notes (Signed)
  Radiation Oncology         [phone] ________________________________  Name: Michele [name] MRN: [id number]  Date: 04/04/2016  DOB: 08-10-1955    Weekly Radiation Therapy Management    ICD-9-CM ICD-10-CM   1. Small cell carcinoma of lung, unspecified laterality (HCC) 162.9 C34.90   2. 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 . . . . . . . The patient presents for routine under treatment assessment.                                 Michele [name] presents for her 13th fraction of radiation to her mediastinum. She reports pain to throat 2/10 and is taking hydrocodone. She denies trouble swallowing. She reports a lot of belching. She reports feeling weak and tired. She reports slight redness to the radiation site. She is using Sonafine cream daily. She has a decreased appetite. She reports she is having a bowel movement about everyday. She is taking daily miralax and 1 senokot-s twice daily. She reports improved dizziness.  She has concerns about the side effects of carafate where it states it could affect the absorption of other medications into the body. The patient is worried her heart medications may not work while taking the carafate.                                  Set-up films were reviewed.                                 The chart was checked. Physical Findings. . .  height is '5\' 5"'$  (1.651 m) and weight is 131 lb 12.8 oz (59.8 kg). Her oral temperature is 98.2 F (36.8 C). Her blood pressure is 96/58 (abnormal) and her pulse is 86. Her respiration is 16 and oxygen saturation is 100%. . Weight essentially stable.  Lungs are clear to auscultation bilaterally. Heart has regular rate and rhythm. No palpable cervical, supraclavicular, or axillary adenopathy.  Impression . . . . . . . The patient is tolerating radiation. Plan . . . . . . . . . . . . Continue treatment as planned. Advised for her to  wait to take other medications a couple hours after taking carafate. She still had some concerns and will speak more about this with Dr. Isidore Moos next week.   ________________________________   Blair Promise, PhD, MD  This document serves as a record of services personally performed by Gery Pray, MD. It was created on his behalf by Arlyce Harman, a trained medical scribe. The creation of this record is based on the scribe's personal observations and the provider's statements to them. This document has been checked and approved by the attending provider.

## 2016-04-04 NOTE — Progress Notes (Signed)
Ms. Coryell presents for her 13th fraction of radiation to her Mediastinum.  Reports  Pain to throat 2/10 Hydrocodone. She reports feeling weak, and tired.   She reports slight redness to her Radiation site. She is using the Sonafine cream daily.  She has a decreased appetite. She reports she is having a bowel movement about everyday.   She is taking daily miralax and 1 senna-s twice daily. She reports improved dizzyness.  Wt Readings from Last 3 Encounters:  04/04/16 131 lb 12.8 oz (59.8 kg)  04/01/16 132 lb 3.2 oz (60 kg)  03/29/16 134 lb 12.8 oz (61.1 kg)  BP (!) 96/58   Pulse 86   Temp 98.2 F (36.8 C) (Oral)   Resp 16   Ht '5\' 5"'$  (1.651 m)   Wt 131 lb 12.8 oz (59.8 kg)   SpO2 100%   BMI 21.93 kg/m

## 2016-04-05 ENCOUNTER — Ambulatory Visit
Admission: RE | Admit: 2016-04-05 | Discharge: 2016-04-05 | Disposition: A | Discharge status: Home / Self Care | Payer: Medicare Other | Source: Ambulatory Visit | Attending: Radiation Oncology | Admitting: Radiation Oncology

## 2016-04-05 DIAGNOSIS — R070 Pain in throat: Secondary | ICD-10-CM | POA: Diagnosis not present

## 2016-04-05 DIAGNOSIS — Z9221 Personal history of antineoplastic chemotherapy: Secondary | ICD-10-CM | POA: Diagnosis not present

## 2016-04-05 DIAGNOSIS — C771 Secondary and unspecified malignant neoplasm of intrathoracic lymph nodes: Secondary | ICD-10-CM | POA: Diagnosis not present

## 2016-04-05 DIAGNOSIS — C3491 Malignant neoplasm of unspecified part of right bronchus or lung: Secondary | ICD-10-CM | POA: Diagnosis not present

## 2016-04-05 DIAGNOSIS — Z510 Encounter for antineoplastic radiation therapy: Secondary | ICD-10-CM | POA: Diagnosis not present

## 2016-04-05 DIAGNOSIS — R05 Cough: Secondary | ICD-10-CM | POA: Diagnosis not present

## 2016-04-08 ENCOUNTER — Telehealth: Payer: Self-pay | Admitting: *Deleted

## 2016-04-08 ENCOUNTER — Ambulatory Visit
Admission: RE | Admit: 2016-04-08 | Discharge: 2016-04-08 | Disposition: A | Discharge status: Home / Self Care | Payer: Medicare Other | Source: Ambulatory Visit | Attending: Radiation Oncology | Admitting: Radiation Oncology

## 2016-04-08 ENCOUNTER — Encounter: Payer: Self-pay | Admitting: Nutrition

## 2016-04-08 ENCOUNTER — Other Ambulatory Visit (HOSPITAL_BASED_OUTPATIENT_CLINIC_OR_DEPARTMENT_OTHER): Payer: Medicare Other

## 2016-04-08 ENCOUNTER — Encounter: Payer: Self-pay | Admitting: Radiation Oncology

## 2016-04-08 VITALS — BP 119/63 | HR 77 | Temp 97.8°F | Ht 65.0 in | Wt 131.4 lb

## 2016-04-08 DIAGNOSIS — C771 Secondary and unspecified malignant neoplasm of intrathoracic lymph nodes: Secondary | ICD-10-CM

## 2016-04-08 DIAGNOSIS — Z510 Encounter for antineoplastic radiation therapy: Secondary | ICD-10-CM | POA: Diagnosis not present

## 2016-04-08 DIAGNOSIS — C3491 Malignant neoplasm of unspecified part of right bronchus or lung: Secondary | ICD-10-CM | POA: Diagnosis not present

## 2016-04-08 DIAGNOSIS — C3490 Malignant neoplasm of unspecified part of unspecified bronchus or lung: Secondary | ICD-10-CM | POA: Diagnosis present

## 2016-04-08 DIAGNOSIS — R070 Pain in throat: Secondary | ICD-10-CM | POA: Diagnosis not present

## 2016-04-08 DIAGNOSIS — R05 Cough: Secondary | ICD-10-CM | POA: Diagnosis not present

## 2016-04-08 DIAGNOSIS — Z9221 Personal history of antineoplastic chemotherapy: Secondary | ICD-10-CM | POA: Diagnosis not present

## 2016-04-08 LAB — COMPREHENSIVE METABOLIC PANEL
ALT: 11 U/L (ref 0–55)
AST: 25 U/L (ref 5–34)
Albumin: 3.4 g/dL — ABNORMAL LOW (ref 3.5–5.0)
Alkaline Phosphatase: 81 U/L (ref 40–150)
Anion Gap: 7 mEq/L (ref 3–11)
BUN: 8.2 mg/dL (ref 7.0–26.0)
CO2: 28 mEq/L (ref 22–29)
Calcium: 9.6 mg/dL (ref 8.4–10.4)
Chloride: 108 mEq/L (ref 98–109)
Creatinine: 0.6 mg/dL (ref 0.6–1.1)
EGFR: >90 mL/min/{1.73_m2} (ref >90)
Glucose: 93 mg/dl (ref 70–140)
Potassium: 4.1 mEq/L (ref 3.5–5.1)
Sodium: 143 mEq/L (ref 136–145)
Total Bilirubin: 0.29 mg/dL (ref 0.20–1.20)
Total Protein: 6.5 g/dL (ref 6.4–8.3)

## 2016-04-08 LAB — CBC WITH DIFFERENTIAL/PLATELET
BASO%: 0.4 % (ref 0.0–2.0)
Basophils Absolute: 0 10*3/uL (ref 0.0–0.1)
EOS%: 0.5 % (ref 0.0–7.0)
Eosinophils Absolute: 0 10*3/uL (ref 0.0–0.5)
HCT: 35.6 % (ref 34.8–46.6)
HGB: 11.8 g/dL (ref 11.6–15.9)
LYMPH%: 8.5 % — ABNORMAL LOW (ref 14.0–49.7)
MCH: 30.3 pg (ref 25.1–34.0)
MCHC: 33.1 g/dL (ref 31.5–36.0)
MCV: 91.5 fL (ref 79.5–101.0)
MONO#: 0.6 10*3/uL (ref 0.1–0.9)
MONO%: 8.4 % (ref 0.0–14.0)
NEUT#: 5.9 10*3/uL (ref 1.5–6.5)
NEUT%: 82.2 % — ABNORMAL HIGH (ref 38.4–76.8)
Platelets: 95 10*3/uL — ABNORMAL LOW (ref 145–400)
RBC: 3.89 10*6/uL (ref 3.70–5.45)
RDW: 14.4 % (ref 11.2–14.5)
WBC: 7.1 10*3/uL (ref 3.9–10.3)
lymph#: 0.6 10*3/uL — ABNORMAL LOW (ref 0.9–3.3)

## 2016-04-08 LAB — MAGNESIUM: Magnesium: 2 mg/dl (ref 1.5–2.5)

## 2016-04-08 NOTE — Progress Notes (Signed)
   Weekly Management Note:  outpatient    ICD-9-CM ICD-10-CM   1. 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  CBCT/MVCT images/Port film x-rays were reviewed.  The chart was checked.  Feeling a bit better.  Complains of heart palpitations and arrhythmias that have prompted her to go to the ED following cycles of chemotherapy.  Last visit to ED 10-28 and discharged without cardiac diagnosis. Also c/o terrible pain in body "r/t shots following chemo"  Also concerned about episodic  LE edema after IV fluids in med/onc.  BP 119/63   Pulse 77   Temp 97.8 F (36.6 C)   Ht '5\' 5"'$  (1.651 m)   Wt 131 lb 6.4 oz (59.6 kg)   SpO2 100% Comment: room air  BMI 21.87 kg/m        Physical Findings:  Wt Readings from Last 3 Encounters:  04/08/16 131 lb 6.4 oz (59.6 kg)  04/04/16 131 lb 12.8 oz (59.8 kg)  04/01/16 132 lb 3.2 oz (60 kg)    height is '5\' 5"'$  (1.651 m) and weight is 131 lb 6.4 oz (59.6 kg). Her temperature is 97.8 F (36.6 C). Her blood pressure is 119/63 and her pulse is 77. Her oxygen saturation is 100%.  NAD, ambulatory, minimal erythema over skin of torso  CBC    Component Value Date/Time   WBC 7.1 04/08/2016 1249   WBC 33.8 (H) 03/30/2016 1100   RBC 3.89 04/08/2016 1249   RBC 3.85 (L) 03/30/2016 1100   HGB 11.8 04/08/2016 1249   HCT 35.6 04/08/2016 1249   PLT 95 (L) 04/08/2016 1249   MCV 91.5 04/08/2016 1249   MCH 30.3 04/08/2016 1249   MCH 30.9 03/30/2016 1100   MCHC 33.1 04/08/2016 1249   MCHC 33.3 03/30/2016 1100   RDW 14.4 04/08/2016 1249   LYMPHSABS 0.6 (L) 04/08/2016 1249   MONOABS 0.6 04/08/2016 1249   EOSABS 0.0 04/08/2016 1249   BASOSABS 0.0 04/08/2016 1249     CMP     Component Value Date/Time   NA 143 04/08/2016 1249   K 4.1 04/08/2016 1249   CL 104 03/30/2016 1100   CO2 28 04/08/2016 1249   GLUCOSE 93  04/08/2016 1249   BUN 8.2 04/08/2016 1249   CREATININE 0.6 04/08/2016 1249   CALCIUM 9.6 04/08/2016 1249   PROT 6.5 04/08/2016 1249   ALBUMIN 3.4 (L) 04/08/2016 1249   AST 25 04/08/2016 1249   ALT 11 04/08/2016 1249   ALKPHOS 81 04/08/2016 1249   BILITOT 0.29 04/08/2016 1249   GFRNONAA >60 03/30/2016 1100   GFRAA >60 03/30/2016 1100     Impression:  The patient is tolerating radiotherapy   Plan:  Continue radiotherapy as planned.   MRI brain negative last week.   Sucralfate PRN esophagitis  She will discuss "arrythmias" with cardiologist and palpitations/body pain/leg swelling post treatments and infusions in med/onc medical oncologist -----------------------------------  [name], MD

## 2016-04-08 NOTE — Telephone Encounter (Signed)
Call from pt reporting she left after radiation today without checking in for lab. She will come back to the office for labs. Instructed pt to check in at registration. Lab receptionist made aware.

## 2016-04-08 NOTE — Progress Notes (Signed)
Provided 2nd complimentary case of Ensure Plus. Patient understands she may receive one more.

## 2016-04-08 NOTE — Progress Notes (Addendum)
Michele [name] is here for her 15th fraction of radiation to her Mediastinum. She denies pain at this time. She has some fatigue and weakness. She reports some itching to her Radiation site, and is using the sonafine twice daily. She denies throat pain, and has not used the carafate prescribed. She reports she is eating well. She is asking for ensure samples, and I will call Dory Peru today.   BP 119/63   Pulse 77   Temp 97.8 F (36.6 C)   Ht '5\' 5"'$  (1.651 m)   Wt 131 lb 6.4 oz (59.6 kg)   SpO2 100% Comment: room air  BMI 21.87 kg/m    Wt Readings from Last 3 Encounters:  04/08/16 131 lb 6.4 oz (59.6 kg)  04/04/16 131 lb 12.8 oz (59.8 kg)  04/01/16 132 lb 3.2 oz (60 kg)

## 2016-04-09 ENCOUNTER — Telehealth: Payer: Self-pay | Admitting: *Deleted

## 2016-04-09 ENCOUNTER — Encounter: Payer: Self-pay | Admitting: Physician Assistant

## 2016-04-09 ENCOUNTER — Ambulatory Visit
Admission: RE | Admit: 2016-04-09 | Discharge: 2016-04-09 | Disposition: A | Discharge status: Home / Self Care | Payer: Medicare Other | Source: Ambulatory Visit | Attending: Radiation Oncology | Admitting: Radiation Oncology

## 2016-04-09 DIAGNOSIS — Z510 Encounter for antineoplastic radiation therapy: Secondary | ICD-10-CM | POA: Diagnosis not present

## 2016-04-09 DIAGNOSIS — C771 Secondary and unspecified malignant neoplasm of intrathoracic lymph nodes: Secondary | ICD-10-CM | POA: Diagnosis not present

## 2016-04-09 DIAGNOSIS — C3491 Malignant neoplasm of unspecified part of right bronchus or lung: Secondary | ICD-10-CM | POA: Diagnosis not present

## 2016-04-09 DIAGNOSIS — R05 Cough: Secondary | ICD-10-CM | POA: Diagnosis not present

## 2016-04-09 DIAGNOSIS — R7301 Impaired fasting glucose: Secondary | ICD-10-CM | POA: Diagnosis not present

## 2016-04-09 DIAGNOSIS — Z9221 Personal history of antineoplastic chemotherapy: Secondary | ICD-10-CM | POA: Diagnosis not present

## 2016-04-09 DIAGNOSIS — R070 Pain in throat: Secondary | ICD-10-CM | POA: Diagnosis not present

## 2016-04-09 DIAGNOSIS — C3490 Malignant neoplasm of unspecified part of unspecified bronchus or lung: Secondary | ICD-10-CM | POA: Diagnosis not present

## 2016-04-09 NOTE — Telephone Encounter (Signed)
"  I received radiation today and also saw my PCP.  At this visit my temperature = 99.9.  Is it okay to take ES tylrnol and how often?  I do have a random runny nose."  Denies seasonal allergies and any other signs of infection.   Okay to take one tylenol every six hours if needed.  Call for temp 100.5 or greater and report to ED if cold, shaking chills with fever.  "What if you all are closed?"  Still call after hours nurse [facility] can reach on-call provider.

## 2016-04-10 ENCOUNTER — Ambulatory Visit
Admission: RE | Admit: 2016-04-10 | Discharge: 2016-04-10 | Disposition: A | Discharge status: Home / Self Care | Payer: Medicare Other | Source: Ambulatory Visit | Attending: Radiation Oncology | Admitting: Radiation Oncology

## 2016-04-10 DIAGNOSIS — C3491 Malignant neoplasm of unspecified part of right bronchus or lung: Secondary | ICD-10-CM | POA: Diagnosis not present

## 2016-04-10 DIAGNOSIS — R070 Pain in throat: Secondary | ICD-10-CM | POA: Diagnosis not present

## 2016-04-10 DIAGNOSIS — Z9221 Personal history of antineoplastic chemotherapy: Secondary | ICD-10-CM | POA: Diagnosis not present

## 2016-04-10 DIAGNOSIS — R05 Cough: Secondary | ICD-10-CM | POA: Diagnosis not present

## 2016-04-10 DIAGNOSIS — Z510 Encounter for antineoplastic radiation therapy: Secondary | ICD-10-CM | POA: Diagnosis not present

## 2016-04-10 DIAGNOSIS — C771 Secondary and unspecified malignant neoplasm of intrathoracic lymph nodes: Secondary | ICD-10-CM | POA: Diagnosis not present

## 2016-04-11 ENCOUNTER — Encounter (HOSPITAL_COMMUNITY): Payer: Self-pay

## 2016-04-11 ENCOUNTER — Ambulatory Visit
Admission: RE | Admit: 2016-04-11 | Discharge: 2016-04-11 | Disposition: A | Discharge status: Home / Self Care | Payer: Medicare Other | Source: Ambulatory Visit | Attending: Radiation Oncology | Admitting: Radiation Oncology

## 2016-04-11 ENCOUNTER — Ambulatory Visit (HOSPITAL_COMMUNITY)
Admission: RE | Admit: 2016-04-11 | Discharge: 2016-04-11 | Disposition: A | Discharge status: Home / Self Care | Payer: Medicare Other | Source: Ambulatory Visit | Attending: Nurse Practitioner | Admitting: Nurse Practitioner

## 2016-04-11 DIAGNOSIS — C771 Secondary and unspecified malignant neoplasm of intrathoracic lymph nodes: Secondary | ICD-10-CM | POA: Diagnosis not present

## 2016-04-11 DIAGNOSIS — Z9221 Personal history of antineoplastic chemotherapy: Secondary | ICD-10-CM | POA: Diagnosis not present

## 2016-04-11 DIAGNOSIS — R05 Cough: Secondary | ICD-10-CM | POA: Diagnosis not present

## 2016-04-11 DIAGNOSIS — C3490 Malignant neoplasm of unspecified part of unspecified bronchus or lung: Secondary | ICD-10-CM | POA: Diagnosis not present

## 2016-04-11 DIAGNOSIS — R070 Pain in throat: Secondary | ICD-10-CM | POA: Diagnosis not present

## 2016-04-11 DIAGNOSIS — I700 Atherosclerosis of aorta: Secondary | ICD-10-CM | POA: Diagnosis not present

## 2016-04-11 DIAGNOSIS — J439 Emphysema, unspecified: Secondary | ICD-10-CM | POA: Insufficient documentation

## 2016-04-11 DIAGNOSIS — R591 Generalized enlarged lymph nodes: Secondary | ICD-10-CM | POA: Diagnosis not present

## 2016-04-11 DIAGNOSIS — C3491 Malignant neoplasm of unspecified part of right bronchus or lung: Secondary | ICD-10-CM | POA: Diagnosis not present

## 2016-04-11 DIAGNOSIS — Z510 Encounter for antineoplastic radiation therapy: Secondary | ICD-10-CM | POA: Diagnosis not present

## 2016-04-11 MED ORDER — IOPAMIDOL (ISOVUE-300) INJECTION 61%
75.0000 mL | Freq: Once | INTRAVENOUS | Status: AC | PRN
  Administered 2016-04-11: 75 mL via INTRAVENOUS

## 2016-04-12 ENCOUNTER — Encounter: Payer: Self-pay | Admitting: Physician Assistant

## 2016-04-12 ENCOUNTER — Ambulatory Visit (INDEPENDENT_AMBULATORY_CARE_PROVIDER_SITE_OTHER): Payer: Medicare Other | Admitting: Physician Assistant

## 2016-04-12 ENCOUNTER — Ambulatory Visit
Admission: RE | Admit: 2016-04-12 | Discharge: 2016-04-12 | Disposition: A | Discharge status: Home / Self Care | Payer: Medicare Other | Source: Ambulatory Visit | Attending: Radiation Oncology | Admitting: Radiation Oncology

## 2016-04-12 VITALS — BP 112/69 | HR 90 | Ht 65.0 in | Wt 128.8 lb

## 2016-04-12 DIAGNOSIS — Z510 Encounter for antineoplastic radiation therapy: Secondary | ICD-10-CM | POA: Diagnosis not present

## 2016-04-12 DIAGNOSIS — C801 Malignant (primary) neoplasm, unspecified: Secondary | ICD-10-CM | POA: Diagnosis not present

## 2016-04-12 DIAGNOSIS — I10 Essential (primary) hypertension: Secondary | ICD-10-CM | POA: Diagnosis not present

## 2016-04-12 DIAGNOSIS — I2510 Atherosclerotic heart disease of native coronary artery without angina pectoris: Secondary | ICD-10-CM | POA: Diagnosis not present

## 2016-04-12 DIAGNOSIS — Z9221 Personal history of antineoplastic chemotherapy: Secondary | ICD-10-CM | POA: Diagnosis not present

## 2016-04-12 DIAGNOSIS — I208 Other forms of angina pectoris: Secondary | ICD-10-CM

## 2016-04-12 DIAGNOSIS — C771 Secondary and unspecified malignant neoplasm of intrathoracic lymph nodes: Secondary | ICD-10-CM | POA: Diagnosis not present

## 2016-04-12 DIAGNOSIS — R070 Pain in throat: Secondary | ICD-10-CM | POA: Diagnosis not present

## 2016-04-12 DIAGNOSIS — C3491 Malignant neoplasm of unspecified part of right bronchus or lung: Secondary | ICD-10-CM | POA: Diagnosis not present

## 2016-04-12 DIAGNOSIS — R002 Palpitations: Secondary | ICD-10-CM

## 2016-04-12 DIAGNOSIS — R05 Cough: Secondary | ICD-10-CM | POA: Diagnosis not present

## 2016-04-12 NOTE — Patient Instructions (Signed)
Medication Instructions:  Your physician recommends that you continue on your current medications as directed. Please refer to the Current Medication list given to you today.  Labwork: None   Testing/Procedures: None   Follow-Up: Your physician recommends that you schedule a follow-up appointment in: Keep upcoming appointment with Dr [name] as scheduled   Any Other Special Instructions Will Be Listed Below (If Applicable).     If you need a refill on your cardiac medications before your next appointment, please call your pharmacy.
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
## 2016-04-13 ENCOUNTER — Encounter: Payer: Self-pay | Admitting: Physician Assistant

## 2016-04-14 ENCOUNTER — Ambulatory Visit: Payer: Medicare Other

## 2016-04-15 ENCOUNTER — Ambulatory Visit
Admission: RE | Admit: 2016-04-15 | Discharge: 2016-04-15 | Disposition: A | Discharge status: Home / Self Care | Payer: Medicare Other | Source: Ambulatory Visit | Attending: Radiation Oncology | Admitting: Radiation Oncology

## 2016-04-15 ENCOUNTER — Encounter: Payer: Self-pay | Admitting: Internal Medicine

## 2016-04-15 ENCOUNTER — Other Ambulatory Visit (HOSPITAL_BASED_OUTPATIENT_CLINIC_OR_DEPARTMENT_OTHER): Payer: Medicare Other

## 2016-04-15 ENCOUNTER — Telehealth: Payer: Self-pay | Admitting: Cardiology

## 2016-04-15 ENCOUNTER — Ambulatory Visit (HOSPITAL_BASED_OUTPATIENT_CLINIC_OR_DEPARTMENT_OTHER): Payer: Medicare Other

## 2016-04-15 ENCOUNTER — Encounter: Payer: Medicare Other | Admitting: Nutrition

## 2016-04-15 ENCOUNTER — Telehealth: Payer: Self-pay | Admitting: *Deleted

## 2016-04-15 ENCOUNTER — Ambulatory Visit (HOSPITAL_BASED_OUTPATIENT_CLINIC_OR_DEPARTMENT_OTHER): Payer: Medicare Other | Admitting: Internal Medicine

## 2016-04-15 ENCOUNTER — Encounter: Payer: Self-pay | Admitting: Radiation Oncology

## 2016-04-15 VITALS — BP 114/61 | HR 81 | Temp 97.8°F | Resp 18 | Ht 65.0 in | Wt 129.0 lb

## 2016-04-15 VITALS — BP 114/61 | HR 81 | Temp 97.8°F | Ht 65.0 in | Wt 129.4 lb

## 2016-04-15 DIAGNOSIS — C3490 Malignant neoplasm of unspecified part of unspecified bronchus or lung: Secondary | ICD-10-CM

## 2016-04-15 DIAGNOSIS — Z510 Encounter for antineoplastic radiation therapy: Secondary | ICD-10-CM | POA: Diagnosis not present

## 2016-04-15 DIAGNOSIS — R52 Pain, unspecified: Secondary | ICD-10-CM

## 2016-04-15 DIAGNOSIS — R05 Cough: Secondary | ICD-10-CM | POA: Diagnosis not present

## 2016-04-15 DIAGNOSIS — Z5111 Encounter for antineoplastic chemotherapy: Secondary | ICD-10-CM | POA: Diagnosis present

## 2016-04-15 DIAGNOSIS — G4700 Insomnia, unspecified: Secondary | ICD-10-CM | POA: Diagnosis not present

## 2016-04-15 DIAGNOSIS — C3491 Malignant neoplasm of unspecified part of right bronchus or lung: Secondary | ICD-10-CM | POA: Diagnosis not present

## 2016-04-15 DIAGNOSIS — C771 Secondary and unspecified malignant neoplasm of intrathoracic lymph nodes: Secondary | ICD-10-CM

## 2016-04-15 DIAGNOSIS — Z9221 Personal history of antineoplastic chemotherapy: Secondary | ICD-10-CM | POA: Diagnosis not present

## 2016-04-15 DIAGNOSIS — R5383 Other fatigue: Secondary | ICD-10-CM

## 2016-04-15 DIAGNOSIS — R070 Pain in throat: Secondary | ICD-10-CM | POA: Diagnosis not present

## 2016-04-15 DIAGNOSIS — R002 Palpitations: Secondary | ICD-10-CM

## 2016-04-15 LAB — CBC WITH DIFFERENTIAL/PLATELET
BASO%: 0.5 % (ref 0.0–2.0)
Basophils Absolute: 0 10*3/uL (ref 0.0–0.1)
EOS%: 0.3 % (ref 0.0–7.0)
Eosinophils Absolute: 0 10*3/uL (ref 0.0–0.5)
HCT: 37.6 % (ref 34.8–46.6)
HGB: 12.6 g/dL (ref 11.6–15.9)
LYMPH%: 6.6 % — ABNORMAL LOW (ref 14.0–49.7)
MCH: 30.7 pg (ref 25.1–34.0)
MCHC: 33.6 g/dL (ref 31.5–36.0)
MCV: 91.3 fL (ref 79.5–101.0)
MONO#: 0.5 10*3/uL (ref 0.1–0.9)
MONO%: 6.6 % (ref 0.0–14.0)
NEUT#: 6.5 10*3/uL (ref 1.5–6.5)
NEUT%: 86 % — ABNORMAL HIGH (ref 38.4–76.8)
Platelets: 258 10*3/uL (ref 145–400)
RBC: 4.11 10*6/uL (ref 3.70–5.45)
RDW: 15.5 % — ABNORMAL HIGH (ref 11.2–14.5)
WBC: 7.5 10*3/uL (ref 3.9–10.3)
lymph#: 0.5 10*3/uL — ABNORMAL LOW (ref 0.9–3.3)

## 2016-04-15 LAB — MAGNESIUM: Magnesium: 2.2 mg/dl (ref 1.5–2.5)

## 2016-04-15 LAB — COMPREHENSIVE METABOLIC PANEL
ALT: 11 U/L (ref 0–55)
AST: 27 U/L (ref 5–34)
Albumin: 3.6 g/dL (ref 3.5–5.0)
Alkaline Phosphatase: 73 U/L (ref 40–150)
Anion Gap: 9 mEq/L (ref 3–11)
BUN: 9.9 mg/dL (ref 7.0–26.0)
CO2: 25 mEq/L (ref 22–29)
Calcium: 9.7 mg/dL (ref 8.4–10.4)
Chloride: 108 mEq/L (ref 98–109)
Creatinine: 0.6 mg/dL (ref 0.6–1.1)
EGFR: >90 mL/min/{1.73_m2} (ref >90)
Glucose: 117 mg/dl (ref 70–140)
Potassium: 4.1 mEq/L (ref 3.5–5.1)
Sodium: 143 mEq/L (ref 136–145)
Total Bilirubin: 0.31 mg/dL (ref 0.20–1.20)
Total Protein: 6.6 g/dL (ref 6.4–8.3)

## 2016-04-15 MED ORDER — SODIUM CHLORIDE 0.9 % IV SOLN
120.0000 mg/m2 | Freq: Once | INTRAVENOUS | Status: AC
  Administered 2016-04-15: 200 mg via INTRAVENOUS
  Filled 2016-04-15: qty 10

## 2016-04-15 MED ORDER — SODIUM CHLORIDE 0.9 % IV SOLN
Freq: Once | INTRAVENOUS | Status: AC
  Administered 2016-04-15: 12:00:00 via INTRAVENOUS

## 2016-04-15 MED ORDER — PALONOSETRON HCL INJECTION 0.25 MG/5ML
INTRAVENOUS | Status: AC
  Filled 2016-04-15: qty 5

## 2016-04-15 MED ORDER — POTASSIUM CHLORIDE 2 MEQ/ML IV SOLN
Freq: Once | INTRAVENOUS | Status: AC
  Administered 2016-04-15: 12:00:00 via INTRAVENOUS
  Filled 2016-04-15: qty 10

## 2016-04-15 MED ORDER — SODIUM CHLORIDE 0.9 % IV SOLN
61.0000 mg/m2 | Freq: Once | INTRAVENOUS | Status: AC
  Administered 2016-04-15: 100 mg via INTRAVENOUS
  Filled 2016-04-15: qty 100

## 2016-04-15 MED ORDER — FOSAPREPITANT DIMEGLUMINE INJECTION 150 MG
Freq: Once | INTRAVENOUS | Status: AC
  Administered 2016-04-15: 14:00:00 via INTRAVENOUS
  Filled 2016-04-15: qty 5

## 2016-04-15 MED ORDER — PALONOSETRON HCL INJECTION 0.25 MG/5ML
0.2500 mg | Freq: Once | INTRAVENOUS | Status: AC
  Administered 2016-04-15: 0.25 mg via INTRAVENOUS

## 2016-04-15 NOTE — Patient Instructions (Signed)
[facility] Discharge Instructions for Patients Receiving Chemotherapy  Today you received the following chemotherapy agents cisplatin/etoposide.   To help prevent nausea and vomiting after your treatment, we encourage you to take your nausea medication as directed.    If you develop nausea and vomiting that is not controlled by your nausea medication, call the clinic.   BELOW ARE SYMPTOMS THAT SHOULD BE REPORTED IMMEDIATELY:  *FEVER GREATER THAN 100.5 F  *CHILLS WITH OR WITHOUT FEVER  NAUSEA AND VOMITING THAT IS NOT CONTROLLED WITH YOUR NAUSEA MEDICATION  *UNUSUAL SHORTNESS OF BREATH  *UNUSUAL BRUISING OR BLEEDING  TENDERNESS IN MOUTH AND THROAT WITH OR WITHOUT PRESENCE OF ULCERS  *URINARY PROBLEMS  *BOWEL PROBLEMS  UNUSUAL RASH Items with * indicate a potential emergency and should be followed up as soon as possible.  Feel free to call the clinic you have any questions or concerns. The clinic phone number is [phone].  

## 2016-04-15 NOTE — Telephone Encounter (Signed)
CALLED PATIENT TO INFORM OF NUTRITION APPT. FOR 04-17-16 @ 12 PM WITH BARBARA NEFF, SPOKE WITH SIGN. OTHER, THOMAS AND HE IS AWARE OF THIS APPT.

## 2016-04-15 NOTE — Addendum Note (Signed)
Addended by: Ardeen Garland on: 04/15/2016 11:41 AM   Modules accepted: Orders

## 2016-04-15 NOTE — Telephone Encounter (Signed)
30 day event monitor for palpitation please

## 2016-04-15 NOTE — Progress Notes (Signed)
Ms. Bath presents for her 20th fraction of radiation to her Mediastinum. She reports mild pain in her Left Lung area. She will take hydrocodone occasionally for this pain. She reports fatigue. She will receive her 3rd cycle of chemotherapy today. Her skin to her radiation area to her upper back is red. She has a few areas of peeling that are healing. She is putting neosporin to the peeling areas and sonafine cream to the other areas. She has redness to her mid chest area, and is also using sonafine cream to this area. She has a decreased appetite, but is eating as well as she can. She will drink an ensure occasionally.   BP 114/61   Pulse 81   Temp 97.8 F (36.6 C)   Ht '5\' 5"'$  (1.651 m)   Wt 129 lb 6.4 oz (58.7 kg)   SpO2 100% Comment: room air  BMI 21.53 kg/m    Wt Readings from Last 3 Encounters:  04/15/16 129 lb 6.4 oz (58.7 kg)  04/12/16 128 lb 12.8 oz (58.4 kg)  04/08/16 131 lb 6.4 oz (59.6 kg)

## 2016-04-15 NOTE — Telephone Encounter (Signed)
F/u message  Pt son returning RN call. Please call back to discuss

## 2016-04-15 NOTE — Telephone Encounter (Signed)
30 day event monitor left detailed message that someone will be calling to schedule

## 2016-04-15 NOTE — Progress Notes (Unsigned)
Per MD [facility], ok to run cisplatin together with hydration fluids.

## 2016-04-15 NOTE — Telephone Encounter (Signed)
Son did not listen to VM-informed Son that scheduling would be call to schedule monitor placement

## 2016-04-15 NOTE — Telephone Encounter (Signed)
New message  Pt son call requesting to speak with RN about getting appt schedule for pt to get a heart monitor. Please call back to discuss

## 2016-04-15 NOTE — Progress Notes (Signed)
   Weekly Management Note:  outpatient    ICD-9-CM ICD-10-CM   1. 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  CBCT/MVCT images/Port film x-rays were reviewed.  The chart was checked.  Mild pain in left lung area.  Fatigue.  Decreased appetite. 5 lb loss over 1 mo.  BP 114/61   Pulse 81   Temp 97.8 F (36.6 C)   Ht '5\' 5"'$  (1.651 m)   Wt 129 lb 6.4 oz (58.7 kg)   SpO2 100% Comment: room air  BMI 21.53 kg/m        Physical Findings:  Wt Readings from Last 3 Encounters:  04/15/16 129 lb (58.5 kg)  04/15/16 129 lb 6.4 oz (58.7 kg)  04/12/16 128 lb 12.8 oz (58.4 kg)    height is '5\' 5"'$  (1.651 m) and weight is 129 lb 6.4 oz (58.7 kg). Her temperature is 97.8 F (36.6 C). Her blood pressure is 114/61 and her pulse is 81. Her oxygen saturation is 100%.  NAD, ambulatory,  erythema and mild dermatitis over skin of torso  CBC    Component Value Date/Time   WBC 7.5 04/15/2016 1042   WBC 33.8 (H) 03/30/2016 1100   RBC 4.11 04/15/2016 1042   RBC 3.85 (L) 03/30/2016 1100   HGB 12.6 04/15/2016 1042   HCT 37.6 04/15/2016 1042   PLT 258 04/15/2016 1042   MCV 91.3 04/15/2016 1042   MCH 30.7 04/15/2016 1042   MCH 30.9 03/30/2016 1100   MCHC 33.6 04/15/2016 1042   MCHC 33.3 03/30/2016 1100   RDW 15.5 (H) 04/15/2016 1042   LYMPHSABS 0.5 (L) 04/15/2016 1042   MONOABS 0.5 04/15/2016 1042   EOSABS 0.0 04/15/2016 1042   BASOSABS 0.0 04/15/2016 1042     CMP     Component Value Date/Time   NA 143 04/15/2016 1042   K 4.1 04/15/2016 1042   CL 104 03/30/2016 1100   CO2 25 04/15/2016 1042   GLUCOSE 117 04/15/2016 1042   BUN 9.9 04/15/2016 1042   CREATININE 0.6 04/15/2016 1042   CALCIUM 9.7 04/15/2016 1042   PROT 6.6 04/15/2016 1042   ALBUMIN 3.6 04/15/2016 1042   AST 27 04/15/2016 1042   ALT 11 04/15/2016 1042   ALKPHOS 73 04/15/2016  1042   BILITOT 0.31 04/15/2016 1042   GFRNONAA >60 03/30/2016 1100   GFRAA >60 03/30/2016 1100     Impression:  The patient is tolerating radiotherapy   Plan:  Continue radiotherapy as planned. Discussed option of PCI in future. We'll review again post RT.   Refer to nutritionist - poor appetite, weight loss  Reviewed recent results from CT chest with her - early response.   Sucralfate PRN esophagitis -----------------------------------  [name], MD
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
## 2016-04-16 ENCOUNTER — Ambulatory Visit
Admission: RE | Admit: 2016-04-16 | Discharge: 2016-04-16 | Disposition: A | Discharge status: Home / Self Care | Payer: Medicare Other | Source: Ambulatory Visit | Attending: Radiation Oncology | Admitting: Radiation Oncology

## 2016-04-16 ENCOUNTER — Ambulatory Visit (HOSPITAL_BASED_OUTPATIENT_CLINIC_OR_DEPARTMENT_OTHER): Payer: Medicare Other

## 2016-04-16 DIAGNOSIS — Z510 Encounter for antineoplastic radiation therapy: Secondary | ICD-10-CM | POA: Diagnosis not present

## 2016-04-16 DIAGNOSIS — C3491 Malignant neoplasm of unspecified part of right bronchus or lung: Secondary | ICD-10-CM

## 2016-04-16 DIAGNOSIS — C3490 Malignant neoplasm of unspecified part of unspecified bronchus or lung: Secondary | ICD-10-CM

## 2016-04-16 DIAGNOSIS — Z5111 Encounter for antineoplastic chemotherapy: Secondary | ICD-10-CM

## 2016-04-16 DIAGNOSIS — R070 Pain in throat: Secondary | ICD-10-CM | POA: Diagnosis not present

## 2016-04-16 DIAGNOSIS — R05 Cough: Secondary | ICD-10-CM | POA: Diagnosis not present

## 2016-04-16 DIAGNOSIS — Z9221 Personal history of antineoplastic chemotherapy: Secondary | ICD-10-CM | POA: Diagnosis not present

## 2016-04-16 DIAGNOSIS — C771 Secondary and unspecified malignant neoplasm of intrathoracic lymph nodes: Secondary | ICD-10-CM | POA: Diagnosis not present

## 2016-04-16 IMAGING — CT CT HEAD W/O CM
2 series · 16 of 30 positions shown, 20 images · non-contrast
Comparison: None.

CLINICAL DATA: Dizziness.  Hypertension.  On anticoagulation.

EXAM:
CT HEAD WITHOUT CONTRAST
TECHNIQUE: Contiguous axial images were obtained from the base of the skull
through the vertex without intravenous contrast.

[Series 201: head w/o, idose (1) · axial · non-contrast · 0.44mm/px · z∈[+95,+220]mm · 13 of 31 slices shown, 17 images]
[im 3/31  brain]
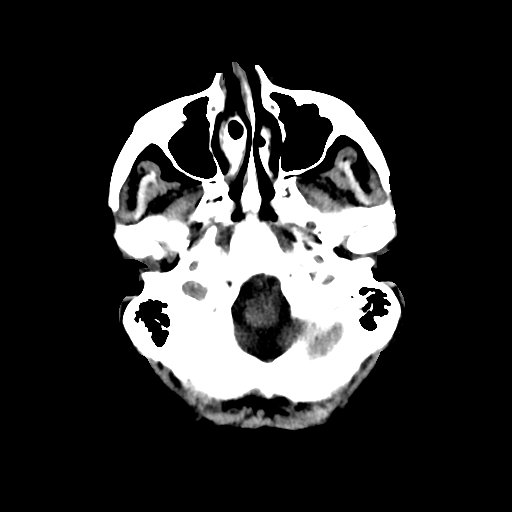
[im 3/31  bone]
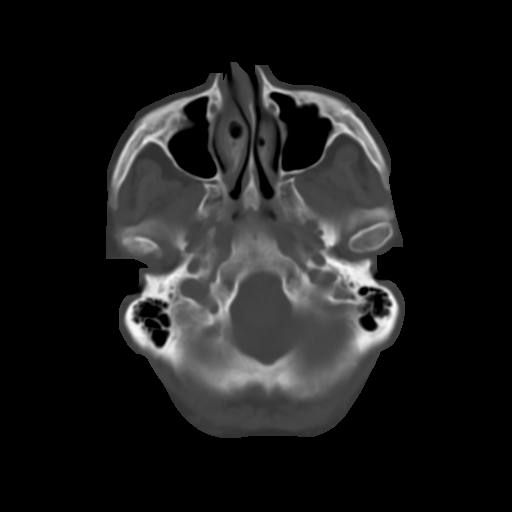
[im 5/31  brain]
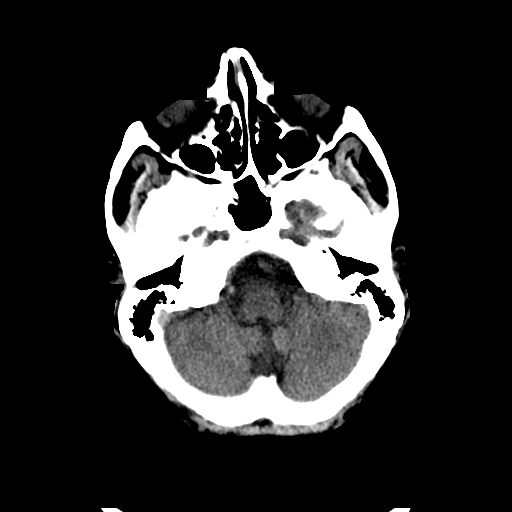
[im 7/31  brain]
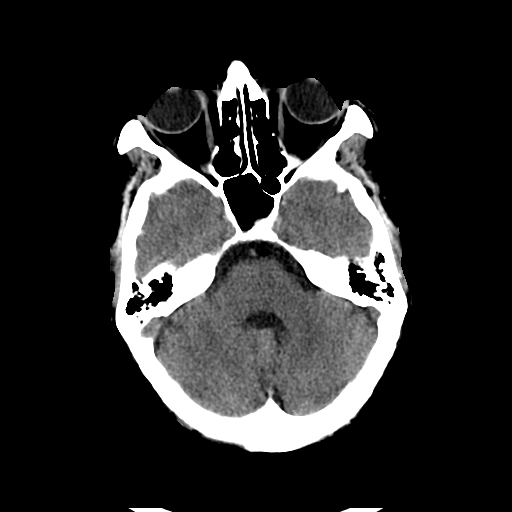
[im 9/31  brain]
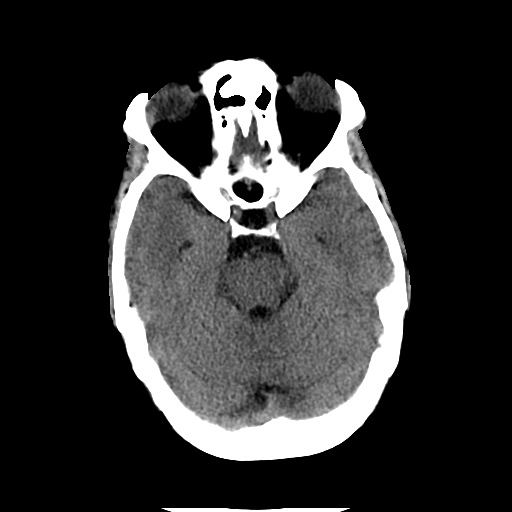
[im 11/31  brain]
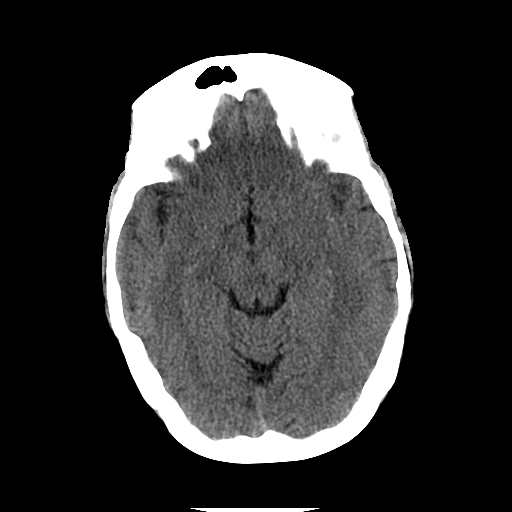
[im 11/31  bone]
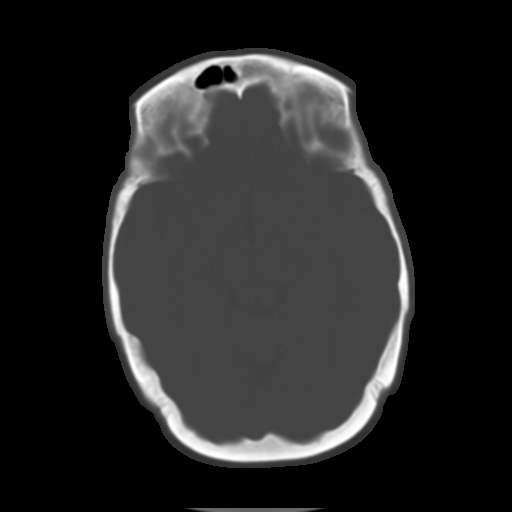
[im 13/31  brain]
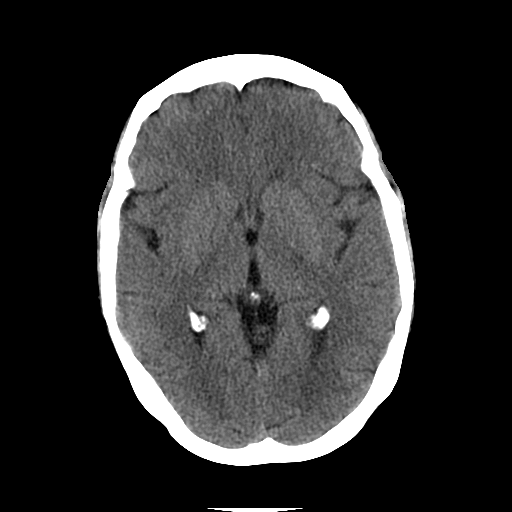
[im 16/31  brain]
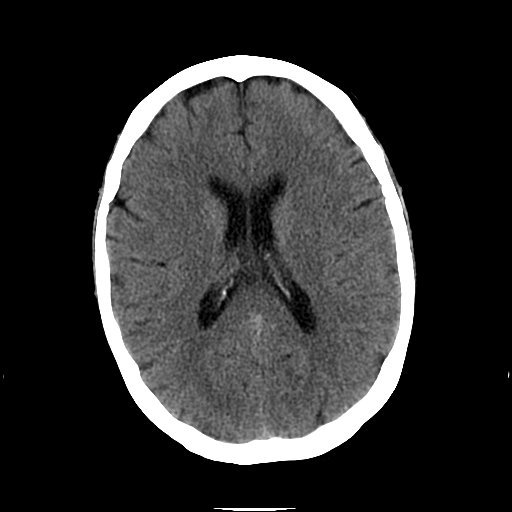
[im 18/31  brain]
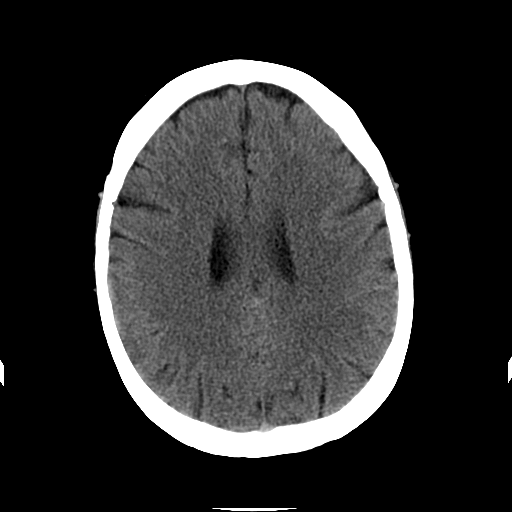
[im 20/31  brain]
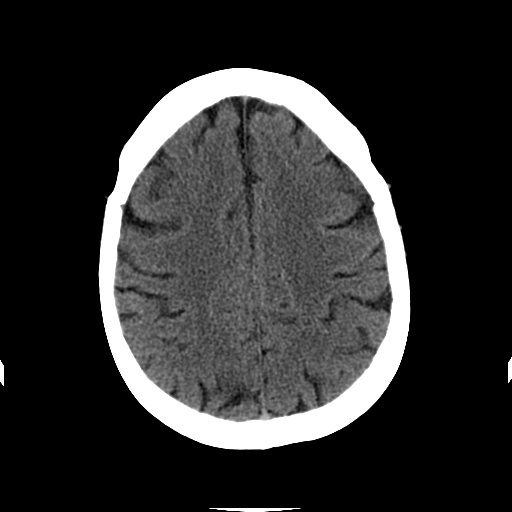
[im 20/31  bone]
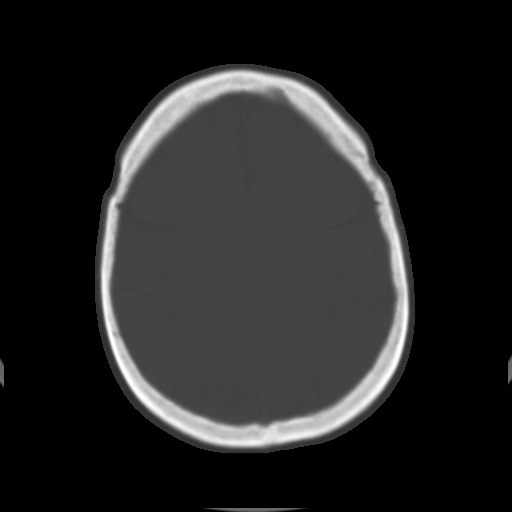
[im 22/31  brain]
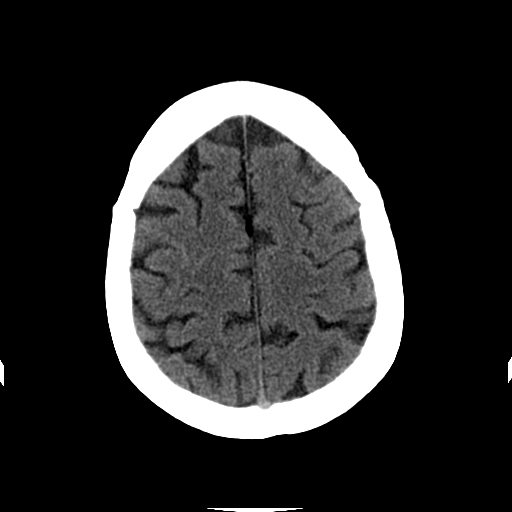
[im 24/31  brain]
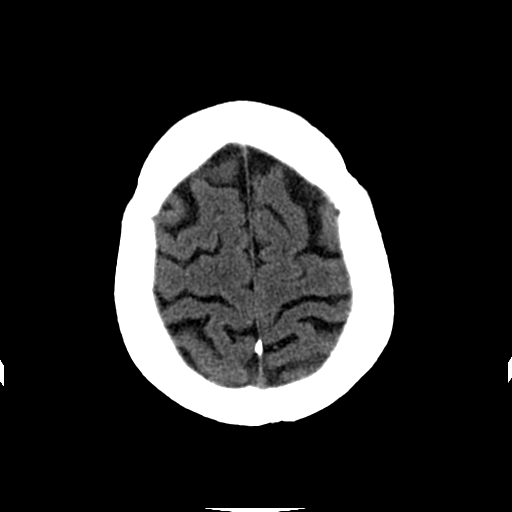
[im 26/31  brain]
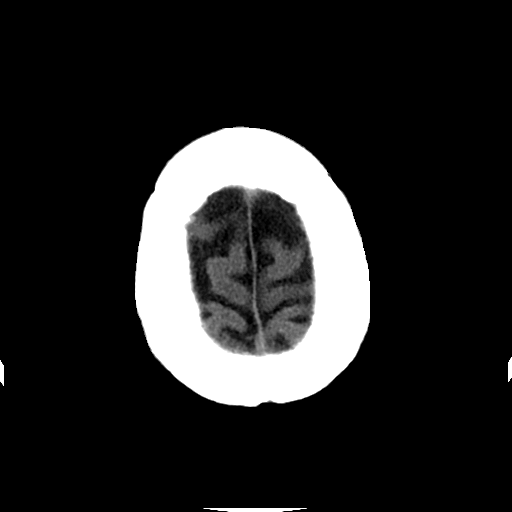
[im 28/31  brain]
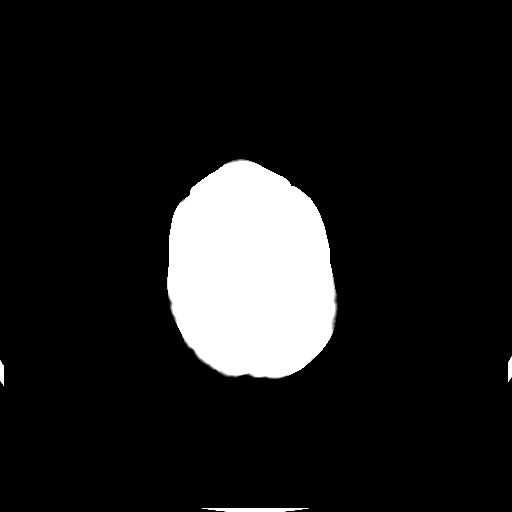
[im 28/31  bone]
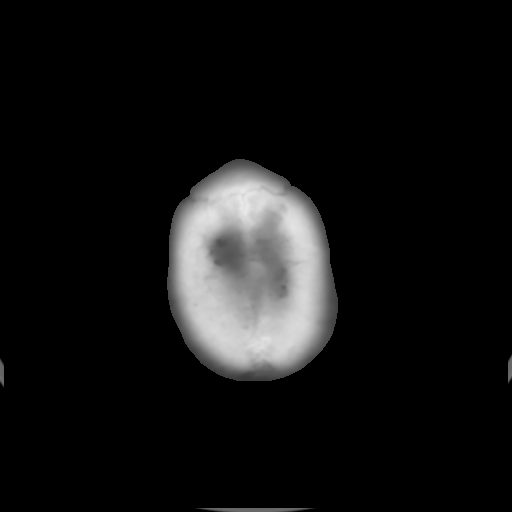

[Series 202: head w/o bone, idose (1) · axial · non-contrast · 0.44mm/px · z∈[+95,+135]mm · 3 of 31 slices shown]
[im 3/31  bone]
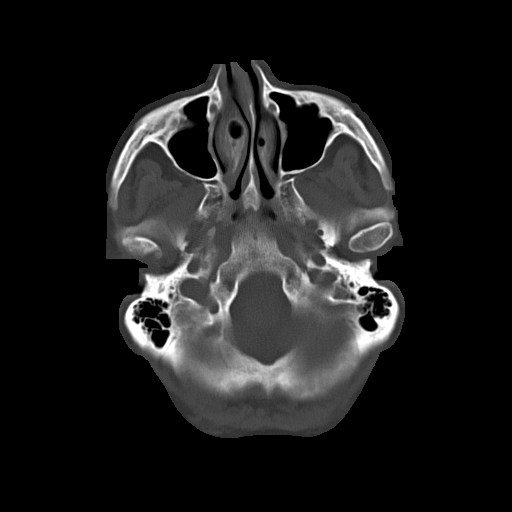
[im 7/31  bone]
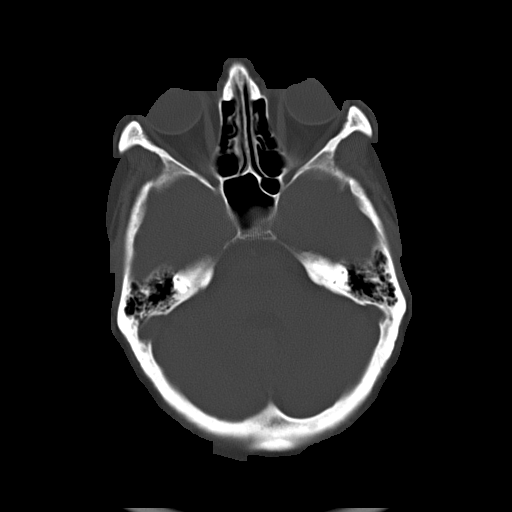
[im 11/31  bone]
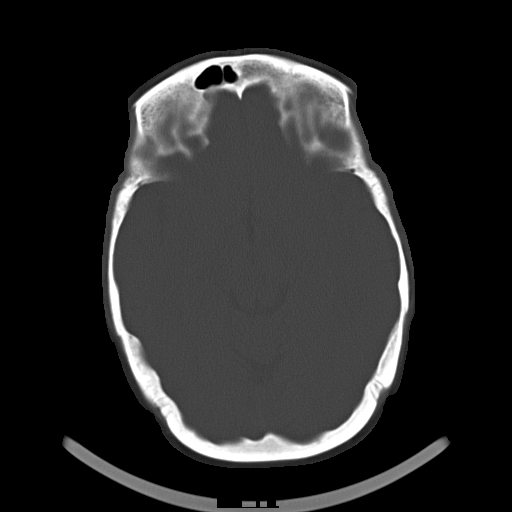

[16 of 30 positions shown; findings below may reference images not displayed]

FINDINGS: No evidence of intracranial hemorrhage, brain edema, or other signs
of acute infarction. No evidence of intracranial mass lesion or mass
effect. No abnormal extraaxial fluid collections identified.
Ventricles are normal in size. No skull abnormality identified.
IMPRESSION: Negative noncontrast head CT.

## 2016-04-16 IMAGING — CR DG CHEST 2V
2 series · 2 of 2 positions shown · non-contrast
Comparison: 08/10/2013

CLINICAL DATA: Shortness of breath. Dizziness. COPD. Prior
myocardial infarction.

EXAM:
CHEST  2 VIEW

[w chest pa]
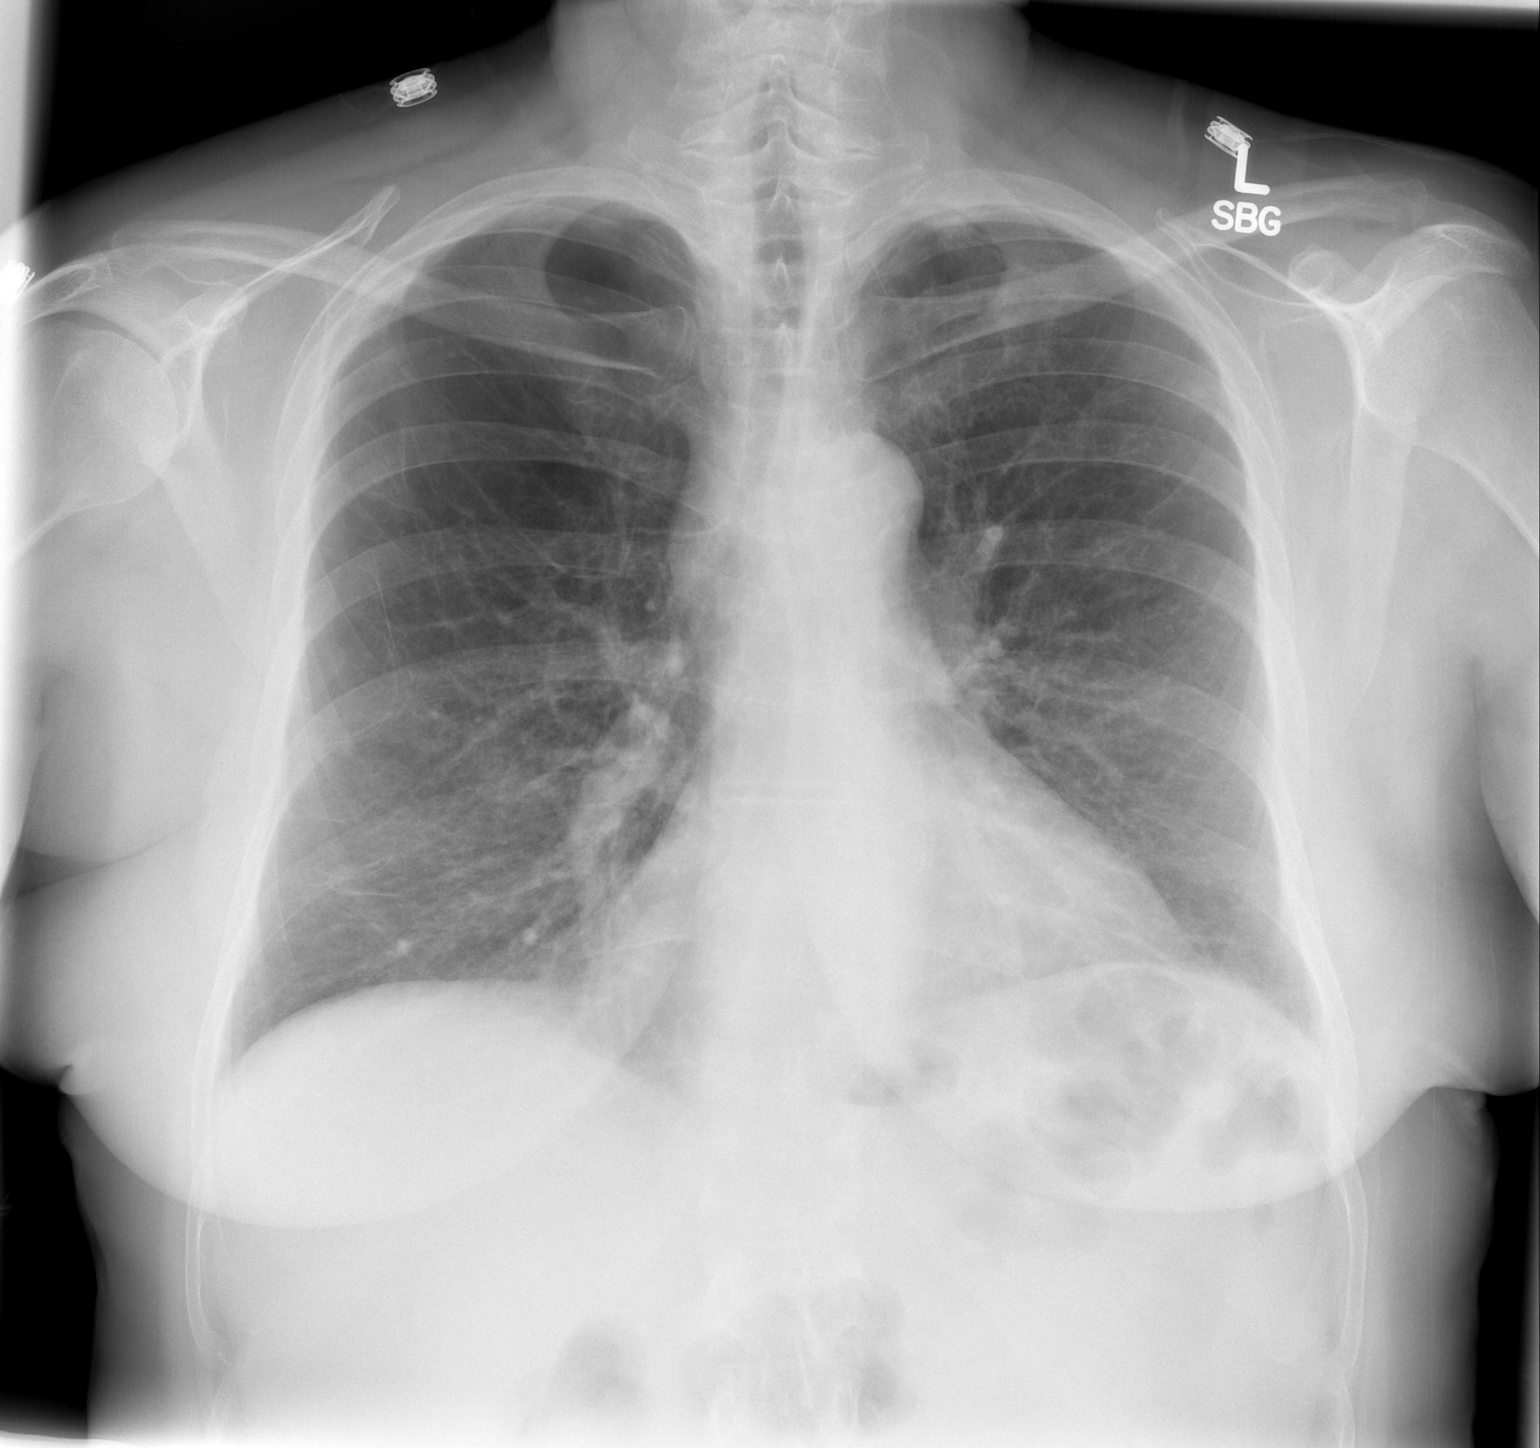

[w chest lat]
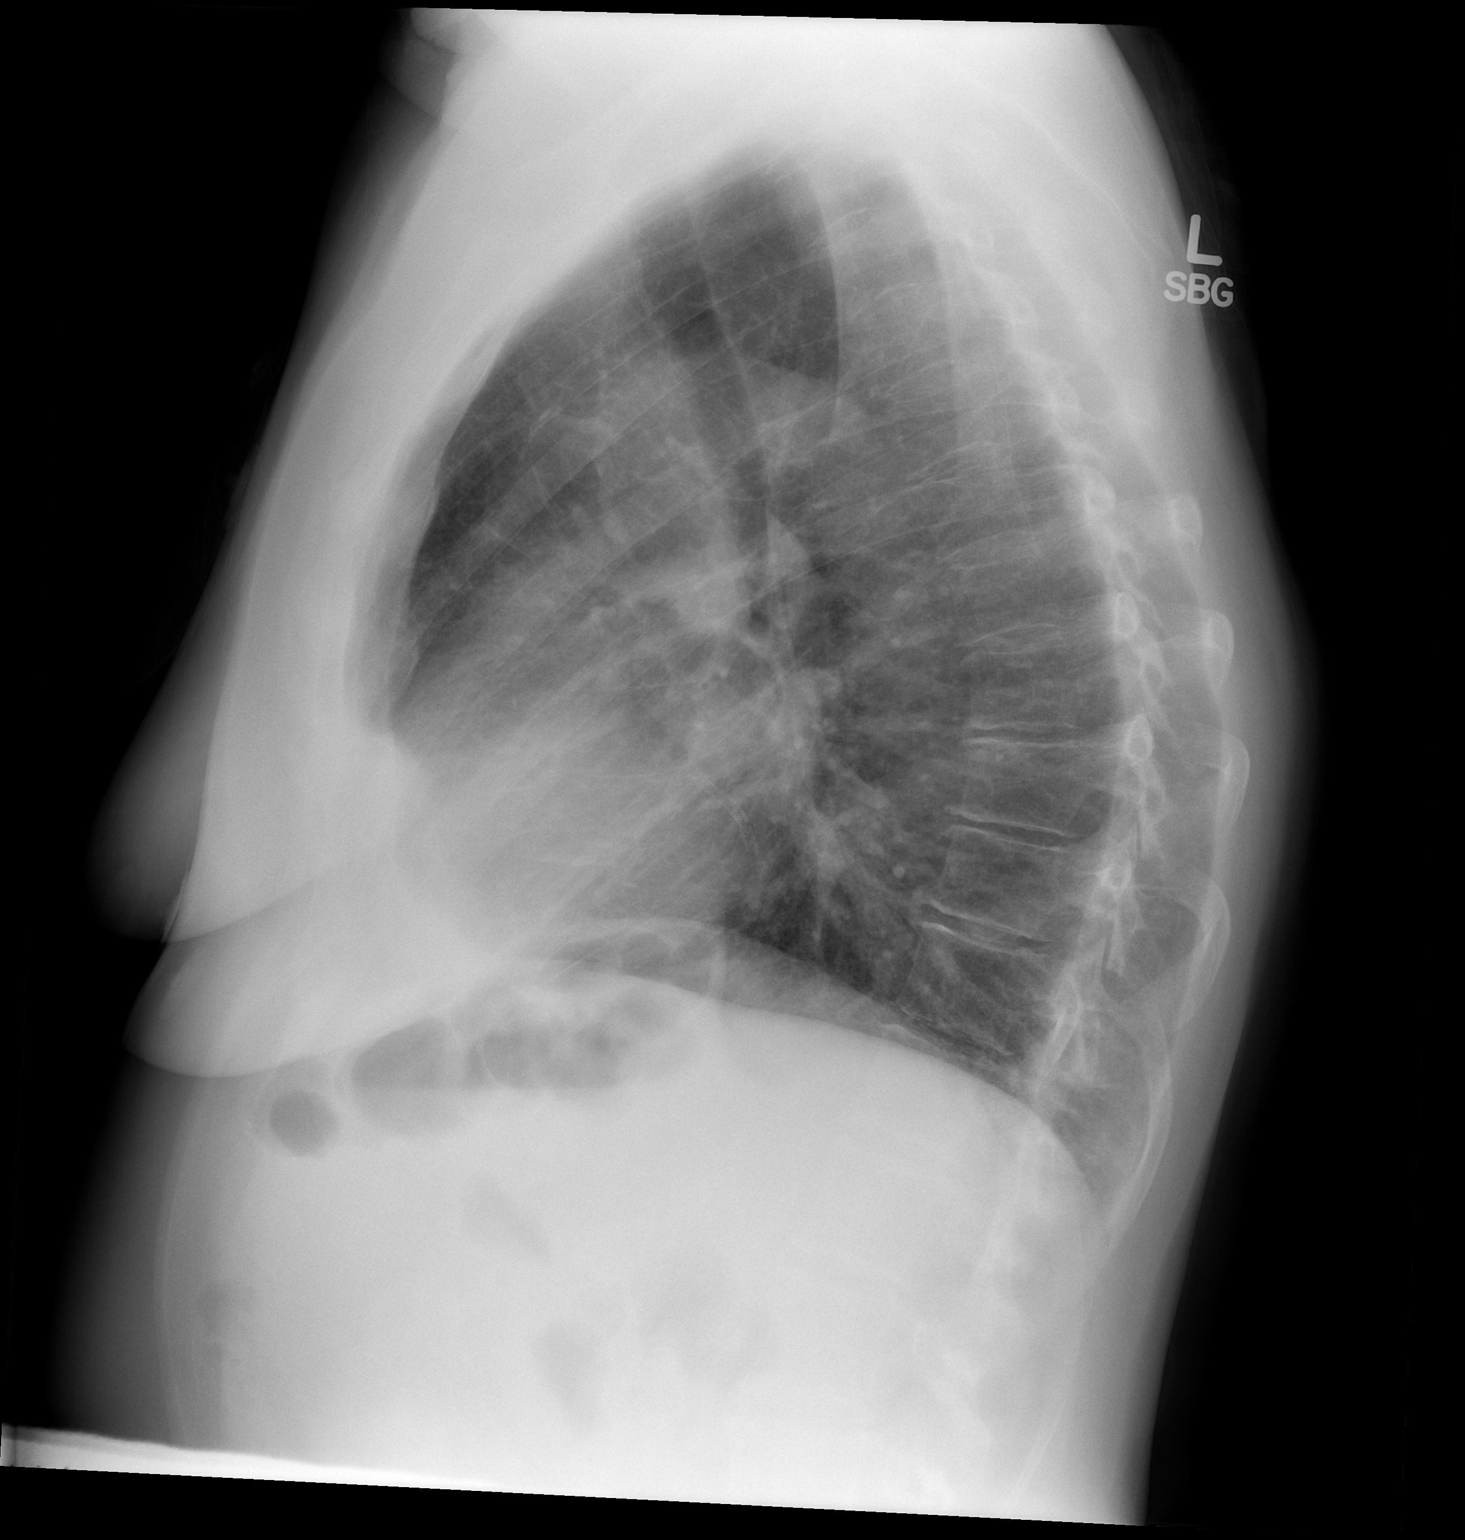

[2 of 2 positions shown; findings below may reference images not displayed]

FINDINGS: Pulmonary emphysema again demonstrated with bullous disease most
severe in the right upper lobe. No evidence of stop pulmonary
infiltrate or edema. No evidence of pleural effusion. Heart size is
within normal limits. No mass or lymphadenopathy identified.
IMPRESSION: Emphysema.  No active lung disease.

## 2016-04-16 IMAGING — MR MR HEAD W/O CM
9 of 10 series · 35 of 48 positions shown · non-contrast
Comparison: Head CT same day.  MRI 08/29/2012

CLINICAL DATA: Left arm and shoulder pain and numbness.  Dizziness.

EXAM:
MRI HEAD WITHOUT CONTRAST
TECHNIQUE: Multiplanar, multiecho pulse sequences of the brain and surrounding
structures were obtained without intravenous contrast.

[Series 3: DWI · axial · 5.0mm · 1.09mm/px · z∈[-42,+103]mm · 6 of 60 slices shown (1 of 4)]
[im 1/60]
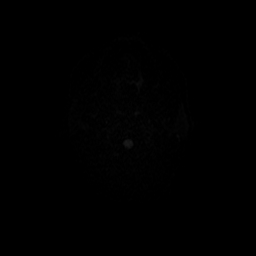
[im 12/60]
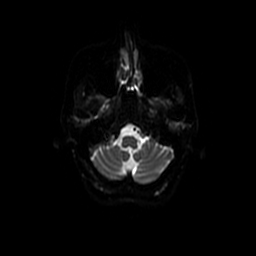
[im 24/60]
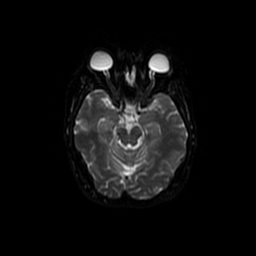
[im 36/60]
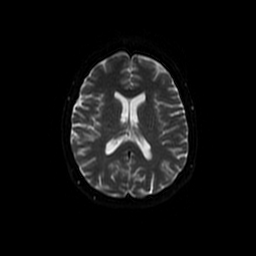
[im 48/60]
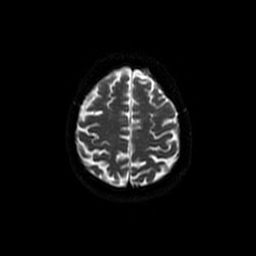
[im 60/60]
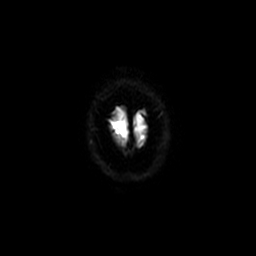

[Series 4: DWI · coronal · 5.0mm · 1.09mm/px · 8 of 64 slices shown (2 of 4)]
[im 1/64]
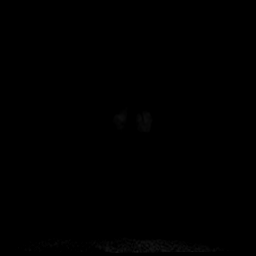
[im 10/64]
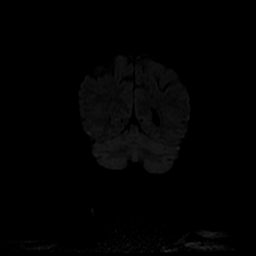
[im 19/64]
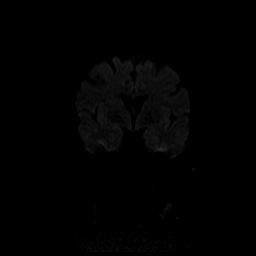
[im 28/64]
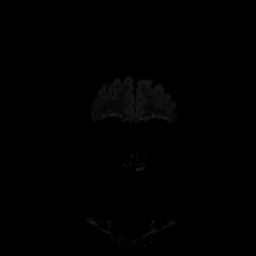
[im 37/64]
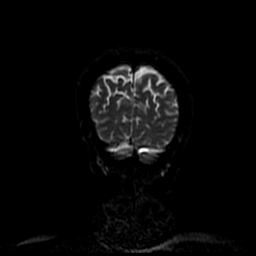
[im 46/64]
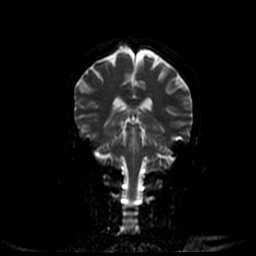
[im 55/64]
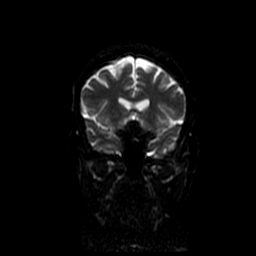
[im 64/64]
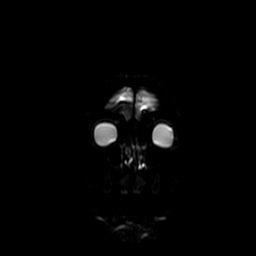

[Series 5: T1 · sagittal · 5.0mm · 0.47mm/px · 3 of 21 slices shown]
[im 1/21]
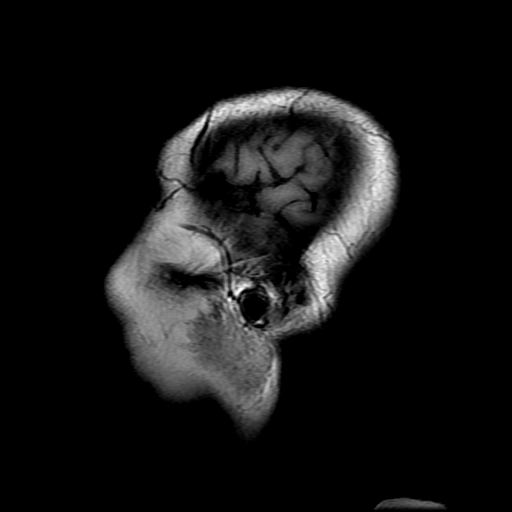
[im 11/21]
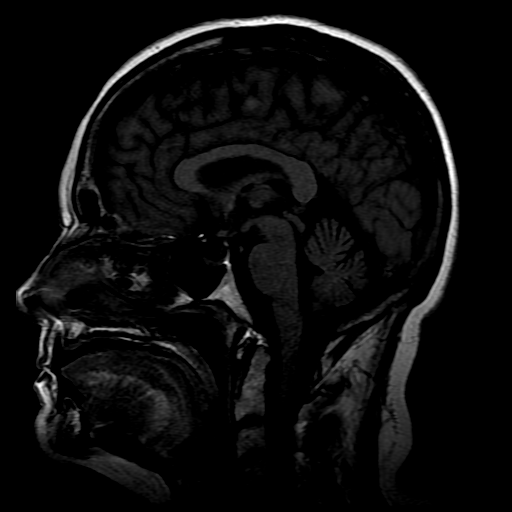
[im 21/21]
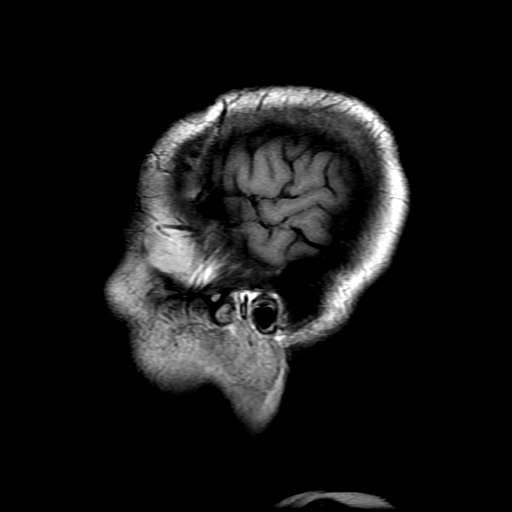

[Series 6: T2 · axial · 5.0mm · 0.43mm/px · z∈[-31,+95]mm · 3 of 22 slices shown (1 of 2)]
[im 1/22]
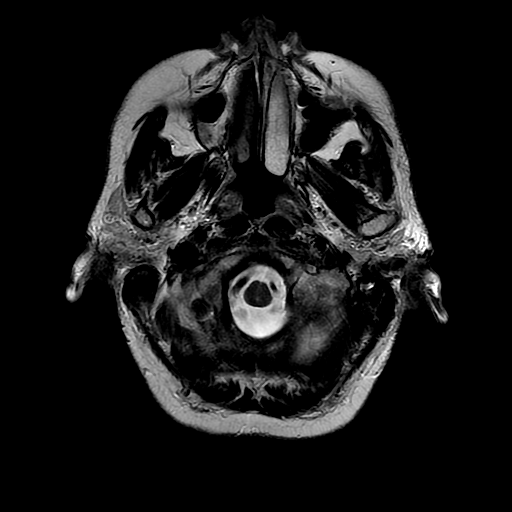
[im 11/22]
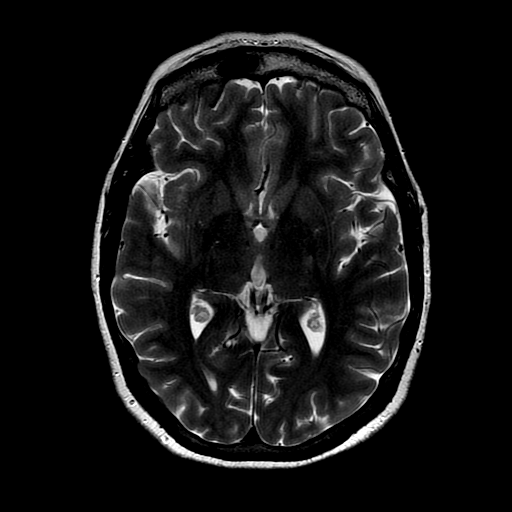
[im 22/22]
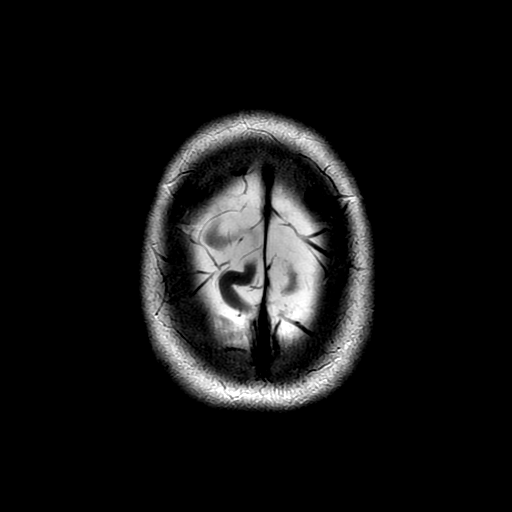

[Series 7: FLAIR · axial · 5.0mm · 0.43mm/px · z∈[-31,+95]mm · 3 of 22 slices shown]
[im 1/22]
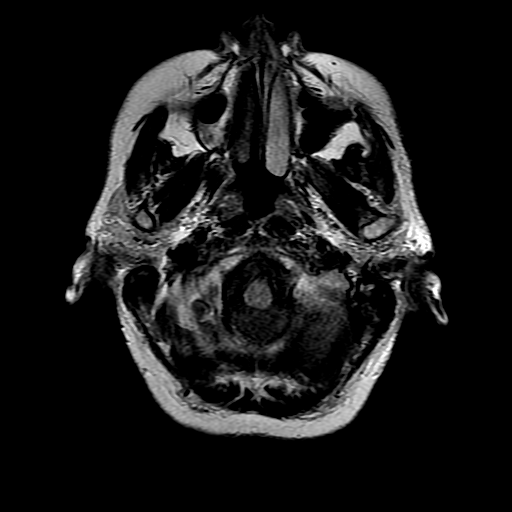
[im 11/22]
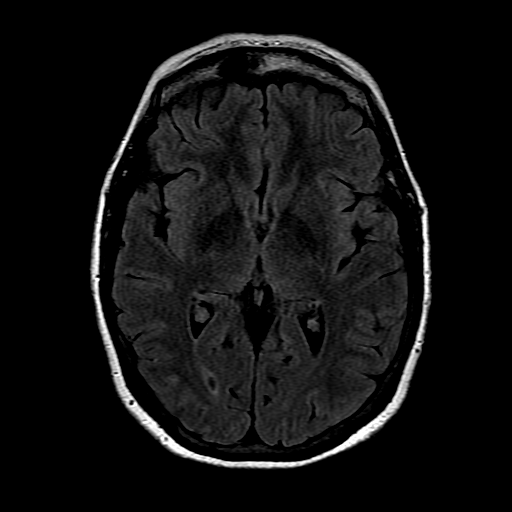
[im 22/22]
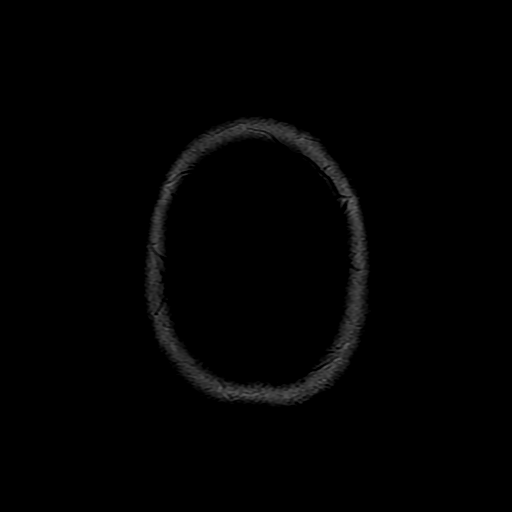

[Series 8: ax mpgr · axial · 5.0mm · 0.43mm/px · 1 of 22 slices shown]
[im 1/22]
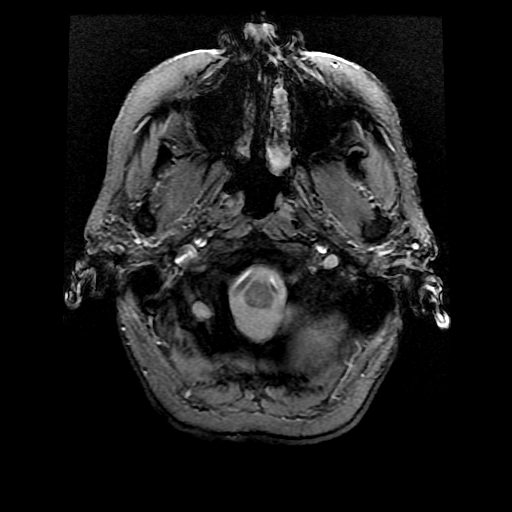

[Series 10: T2 · coronal · 6.0mm · 0.39mm/px · 3 of 27 slices shown (2 of 2)]
[im 1/27]
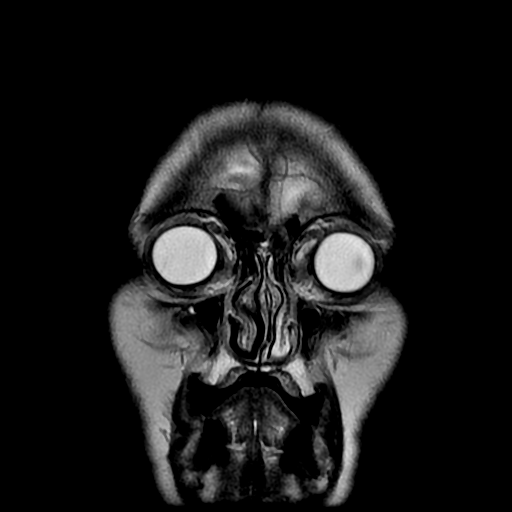
[im 14/27]
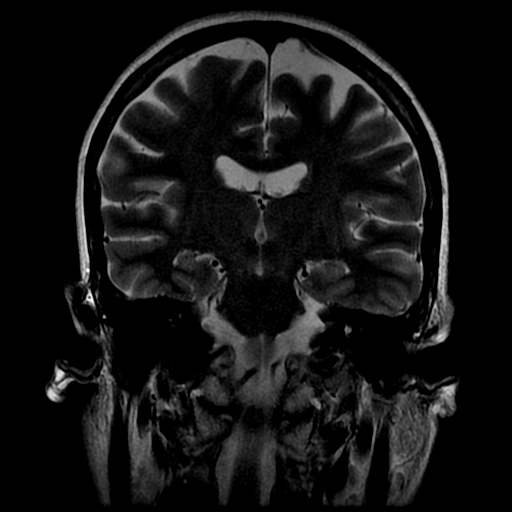
[im 27/27]
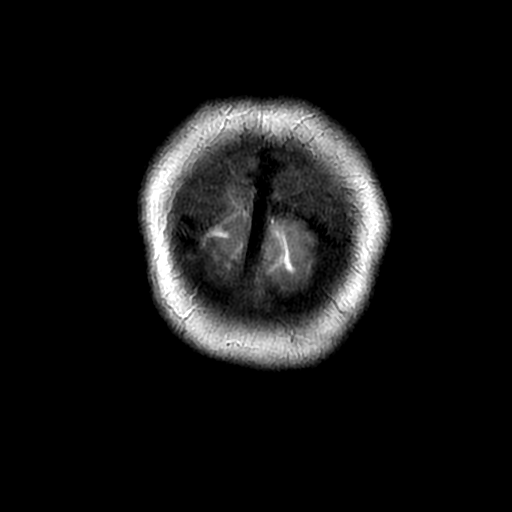

[Series 300: DWI · axial · 5.0mm · 1.09mm/px · z∈[-42,+103]mm · 4 of 30 slices shown (3 of 4)]
[im 1/30]
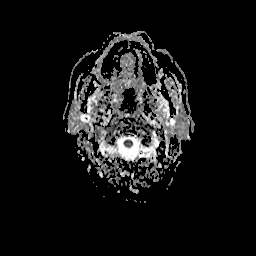
[im 10/30]
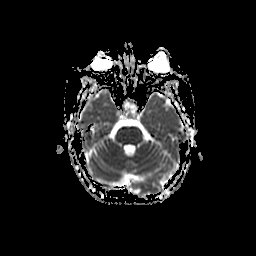
[im 20/30]
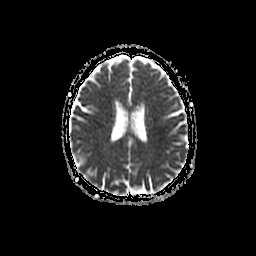
[im 30/30]
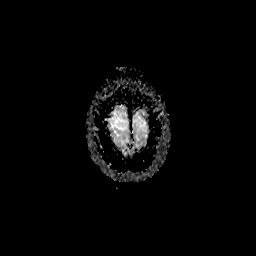

[Series 400: DWI · coronal · 5.0mm · 1.09mm/px · 4 of 32 slices shown (4 of 4)]
[im 1/32]
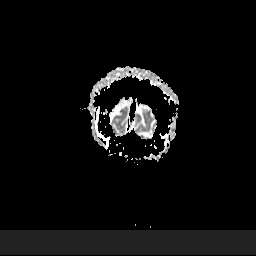
[im 11/32]
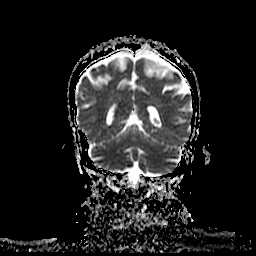
[im 21/32]
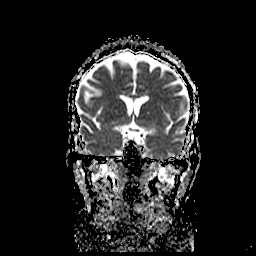
[im 32/32]
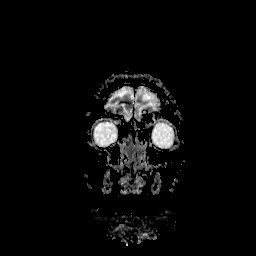

[35 of 48 positions shown; findings below may reference images not displayed]

FINDINGS: The brain has a normal appearance on all pulse sequences without
evidence of malformation, atrophy, old or acute infarction, mass
lesion, hemorrhage, hydrocephalus or extra-axial collection. No
pituitary mass. No fluid in the sinuses, middle ears or mastoids. No
skull or skullbase lesion. There is flow in the major vessels at the
base of the brain. Major venous sinuses show flow.
IMPRESSION: Study within normal limits for a person of this age. No cause of the
presenting symptoms is identified.

## 2016-04-16 MED ORDER — DEXAMETHASONE SODIUM PHOSPHATE 10 MG/ML IJ SOLN
INTRAMUSCULAR | Status: AC
  Filled 2016-04-16: qty 1

## 2016-04-16 MED ORDER — SODIUM CHLORIDE 0.9 % IV SOLN
Freq: Once | INTRAVENOUS | Status: AC
  Administered 2016-04-16: 14:00:00 via INTRAVENOUS

## 2016-04-16 MED ORDER — SODIUM CHLORIDE 0.9 % IV SOLN
120.0000 mg/m2 | Freq: Once | INTRAVENOUS | Status: AC
  Administered 2016-04-16: 200 mg via INTRAVENOUS
  Filled 2016-04-16: qty 10

## 2016-04-16 MED ORDER — DEXAMETHASONE SODIUM PHOSPHATE 10 MG/ML IJ SOLN
5.0000 mg | Freq: Once | INTRAMUSCULAR | Status: AC
  Administered 2016-04-16: 5 mg via INTRAVENOUS

## 2016-04-16 NOTE — Patient Instructions (Signed)
[facility] Discharge Instructions for Patients Receiving Chemotherapy  Today you received the following chemotherapy agents: Etoposide   To help prevent nausea and vomiting after your treatment, we encourage you to take your nausea medication as directed.    If you develop nausea and vomiting that is not controlled by your nausea medication, call the clinic.   BELOW ARE SYMPTOMS THAT SHOULD BE REPORTED IMMEDIATELY:  *FEVER GREATER THAN 100.5 F  *CHILLS WITH OR WITHOUT FEVER  NAUSEA AND VOMITING THAT IS NOT CONTROLLED WITH YOUR NAUSEA MEDICATION  *UNUSUAL SHORTNESS OF BREATH  *UNUSUAL BRUISING OR BLEEDING  TENDERNESS IN MOUTH AND THROAT WITH OR WITHOUT PRESENCE OF ULCERS  *URINARY PROBLEMS  *BOWEL PROBLEMS  UNUSUAL RASH Items with * indicate a potential emergency and should be followed up as soon as possible.  Feel free to call the clinic you have any questions or concerns. The clinic phone number is [phone].  Please show the CHEMO ALERT CARD at check-in to the Emergency Department and triage nurse.   

## 2016-04-17 ENCOUNTER — Ambulatory Visit (HOSPITAL_BASED_OUTPATIENT_CLINIC_OR_DEPARTMENT_OTHER): Payer: Medicare Other

## 2016-04-17 ENCOUNTER — Ambulatory Visit
Admission: RE | Admit: 2016-04-17 | Discharge: 2016-04-17 | Disposition: A | Discharge status: Home / Self Care | Payer: Medicare Other | Source: Ambulatory Visit | Attending: Radiation Oncology | Admitting: Radiation Oncology

## 2016-04-17 ENCOUNTER — Encounter: Payer: Medicare Other | Admitting: Nutrition

## 2016-04-17 ENCOUNTER — Encounter: Payer: Medicare Other | Admitting: Nurse Practitioner

## 2016-04-17 ENCOUNTER — Encounter: Payer: Self-pay | Admitting: Nurse Practitioner

## 2016-04-17 ENCOUNTER — Ambulatory Visit (HOSPITAL_BASED_OUTPATIENT_CLINIC_OR_DEPARTMENT_OTHER): Payer: Medicare Other | Admitting: Nurse Practitioner

## 2016-04-17 ENCOUNTER — Telehealth: Payer: Self-pay | Admitting: *Deleted

## 2016-04-17 VITALS — BP 148/69 | HR 67 | Temp 97.8°F | Resp 18

## 2016-04-17 DIAGNOSIS — C3491 Malignant neoplasm of unspecified part of right bronchus or lung: Secondary | ICD-10-CM

## 2016-04-17 DIAGNOSIS — R600 Localized edema: Secondary | ICD-10-CM

## 2016-04-17 DIAGNOSIS — Z5111 Encounter for antineoplastic chemotherapy: Secondary | ICD-10-CM

## 2016-04-17 DIAGNOSIS — Z510 Encounter for antineoplastic radiation therapy: Secondary | ICD-10-CM | POA: Diagnosis not present

## 2016-04-17 DIAGNOSIS — R05 Cough: Secondary | ICD-10-CM | POA: Diagnosis not present

## 2016-04-17 DIAGNOSIS — R070 Pain in throat: Secondary | ICD-10-CM | POA: Diagnosis not present

## 2016-04-17 DIAGNOSIS — T148XXA Other injury of unspecified body region, initial encounter: Secondary | ICD-10-CM

## 2016-04-17 DIAGNOSIS — Z9221 Personal history of antineoplastic chemotherapy: Secondary | ICD-10-CM | POA: Diagnosis not present

## 2016-04-17 DIAGNOSIS — C3490 Malignant neoplasm of unspecified part of unspecified bronchus or lung: Secondary | ICD-10-CM

## 2016-04-17 DIAGNOSIS — C771 Secondary and unspecified malignant neoplasm of intrathoracic lymph nodes: Secondary | ICD-10-CM | POA: Diagnosis not present

## 2016-04-17 DIAGNOSIS — T80810A Extravasation of vesicant antineoplastic chemotherapy, initial encounter: Secondary | ICD-10-CM | POA: Diagnosis not present

## 2016-04-17 MED ORDER — DEXAMETHASONE SODIUM PHOSPHATE 10 MG/ML IJ SOLN
INTRAMUSCULAR | Status: AC
  Filled 2016-04-17: qty 1

## 2016-04-17 MED ORDER — SODIUM CHLORIDE 0.9 % IV SOLN
Freq: Once | INTRAVENOUS | Status: AC
  Administered 2016-04-17: 13:00:00 via INTRAVENOUS

## 2016-04-17 MED ORDER — SODIUM CHLORIDE 0.9 % IV SOLN
120.0000 mg/m2 | Freq: Once | INTRAVENOUS | Status: AC
  Administered 2016-04-17: 200 mg via INTRAVENOUS
  Filled 2016-04-17: qty 10

## 2016-04-17 MED ORDER — DEXAMETHASONE SODIUM PHOSPHATE 10 MG/ML IJ SOLN
5.0000 mg | Freq: Once | INTRAMUSCULAR | Status: AC
  Administered 2016-04-17: 5 mg via INTRAVENOUS

## 2016-04-17 NOTE — Patient Instructions (Signed)
[facility] Discharge Instructions for Patients Receiving Chemotherapy  Today you received the following chemotherapy agents: Etoposide   To help prevent nausea and vomiting after your treatment, we encourage you to take your nausea medication as directed.    If you develop nausea and vomiting that is not controlled by your nausea medication, call the clinic.   BELOW ARE SYMPTOMS THAT SHOULD BE REPORTED IMMEDIATELY:  *FEVER GREATER THAN 100.5 F  *CHILLS WITH OR WITHOUT FEVER  NAUSEA AND VOMITING THAT IS NOT CONTROLLED WITH YOUR NAUSEA MEDICATION  *UNUSUAL SHORTNESS OF BREATH  *UNUSUAL BRUISING OR BLEEDING  TENDERNESS IN MOUTH AND THROAT WITH OR WITHOUT PRESENCE OF ULCERS  *URINARY PROBLEMS  *BOWEL PROBLEMS  UNUSUAL RASH Items with * indicate a potential emergency and should be followed up as soon as possible.  Feel free to call the clinic you have any questions or concerns. The clinic phone number is [phone].  Please show the CHEMO ALERT CARD at check-in to the Emergency Department and triage nurse.   

## 2016-04-17 NOTE — Progress Notes (Signed)
Upon arrival to infusion, pt c/o swelling in her R hand that she believes to be associated with her IV infusion here on Monday. Selena Lesser, NP saw the patient at chairside regarding this issue. Patient is advised to elevate the extremity and use a warm compress at home.

## 2016-04-17 NOTE — Telephone Encounter (Signed)
Oncology Nurse Navigator Documentation  Oncology Nurse Navigator Flowsheets 04/17/2016  Navigator Encounter Type Telephone/I received notification from Retta Mac NP that patient wanted to be seen with Lattie Haw tomorrow and go over scans.  I updated Dr. Julien Nordmann he stated he went over scans and can update her on follow up appt.  I called patient to update.  I left my name and phone number to call.    Telephone Outgoing Call  Treatment Phase Treatment  Barriers/Navigation Needs Education  Education Other  Interventions Education  Education Method Verbal  Acuity Level 1  Time Spent with Patient 15

## 2016-04-18 ENCOUNTER — Telehealth: Payer: Self-pay | Admitting: *Deleted

## 2016-04-18 ENCOUNTER — Ambulatory Visit
Admission: RE | Admit: 2016-04-18 | Discharge: 2016-04-18 | Disposition: A | Discharge status: Home / Self Care | Payer: Medicare Other | Source: Ambulatory Visit | Attending: Radiation Oncology | Admitting: Radiation Oncology

## 2016-04-18 ENCOUNTER — Encounter: Payer: Self-pay | Admitting: Nurse Practitioner

## 2016-04-18 DIAGNOSIS — R070 Pain in throat: Secondary | ICD-10-CM | POA: Diagnosis not present

## 2016-04-18 DIAGNOSIS — Z510 Encounter for antineoplastic radiation therapy: Secondary | ICD-10-CM | POA: Diagnosis not present

## 2016-04-18 DIAGNOSIS — C3491 Malignant neoplasm of unspecified part of right bronchus or lung: Secondary | ICD-10-CM | POA: Diagnosis not present

## 2016-04-18 DIAGNOSIS — Z9221 Personal history of antineoplastic chemotherapy: Secondary | ICD-10-CM | POA: Diagnosis not present

## 2016-04-18 DIAGNOSIS — R05 Cough: Secondary | ICD-10-CM | POA: Diagnosis not present

## 2016-04-18 DIAGNOSIS — C771 Secondary and unspecified malignant neoplasm of intrathoracic lymph nodes: Secondary | ICD-10-CM | POA: Diagnosis not present

## 2016-04-18 DIAGNOSIS — T148XXA Other injury of unspecified body region, initial encounter: Secondary | ICD-10-CM | POA: Insufficient documentation

## 2016-04-18 NOTE — Assessment & Plan Note (Addendum)
Patient presented to [facility] Monday 04/15/2016 for cycle 3, day 1 of her cisplatin/etoposide chemotherapy regimen.  She is scheduled to return on 04/19/2016 for her Neulasta injection.  Patient also continues with her radiation treatments as planned.  She is scheduled for labs only on 04/22/2016.  She is scheduled for labs and a follow-up visit on 04/29/2016.
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
## 2016-04-18 NOTE — Telephone Encounter (Signed)
Walk in form received.  "hand swollen from chemo Monday hasn't gone down.  They told me to see someone today if not gone down today cause could went into cells.  Was painful stick all 7 hours Monday."  D1 Received Cisplatin and Etoposide on 04-15-2016 to right hand.  D2, D3 evaluated in treatment area.  Seen yesterday advised to elevate right arm using warm compresses intermittently. This nurse to lobby for further assessment.  right hand slightly larger than left with no streaks, redness or warmth.  hand cooler to touch than left.  [facility] notified.  Instructed to have patient continue elevating right arm and warm compresses.  Patient given these instructions.  Asked "if rt hand, arm is infected"   Advised she monitor and call if redness, warmth or skin break or drainage occurs.

## 2016-04-18 NOTE — Assessment & Plan Note (Signed)
Patient complained of some edema and discomfort to the top of her right hand since receiving her chemotherapy peripherally to that right hand insertion site on Monday, 04/15/2016.  She denies any other new symptoms whatsoever.  She denies any recent fevers or chills.  Exam today reveals trace/mild edema to the patient's); but no erythema, warmth, tenderness, or red streaks.  Long discussion with patient regarding the possibility that this was indeed a chemotherapy extravasation-but unclear if this was related to the cisplatin or the etoposide.  Patient was advised to elevate her arm above the level of for heart when [facility] rest; and to use warm, moist compresses to the site as well.  Advised patient that she should call/return or go directly to the emergency department for any worsening symptoms whatsoever.

## 2016-04-19 ENCOUNTER — Ambulatory Visit (HOSPITAL_BASED_OUTPATIENT_CLINIC_OR_DEPARTMENT_OTHER): Payer: Medicare Other

## 2016-04-19 ENCOUNTER — Ambulatory Visit
Admission: RE | Admit: 2016-04-19 | Discharge: 2016-04-19 | Disposition: A | Discharge status: Home / Self Care | Payer: Medicare Other | Source: Ambulatory Visit | Attending: Radiation Oncology | Admitting: Radiation Oncology

## 2016-04-19 VITALS — BP 136/71 | HR 80 | Temp 97.7°F | Resp 20

## 2016-04-19 DIAGNOSIS — Z9221 Personal history of antineoplastic chemotherapy: Secondary | ICD-10-CM | POA: Diagnosis not present

## 2016-04-19 DIAGNOSIS — R05 Cough: Secondary | ICD-10-CM | POA: Diagnosis not present

## 2016-04-19 DIAGNOSIS — Z5189 Encounter for other specified aftercare: Secondary | ICD-10-CM

## 2016-04-19 DIAGNOSIS — C3490 Malignant neoplasm of unspecified part of unspecified bronchus or lung: Secondary | ICD-10-CM

## 2016-04-19 DIAGNOSIS — R070 Pain in throat: Secondary | ICD-10-CM | POA: Diagnosis not present

## 2016-04-19 DIAGNOSIS — C3491 Malignant neoplasm of unspecified part of right bronchus or lung: Secondary | ICD-10-CM

## 2016-04-19 DIAGNOSIS — Z510 Encounter for antineoplastic radiation therapy: Secondary | ICD-10-CM | POA: Diagnosis not present

## 2016-04-19 DIAGNOSIS — C771 Secondary and unspecified malignant neoplasm of intrathoracic lymph nodes: Secondary | ICD-10-CM | POA: Diagnosis not present

## 2016-04-19 MED ORDER — PEGFILGRASTIM INJECTION 6 MG/0.6ML ~~LOC~~
6.0000 mg | PREFILLED_SYRINGE | Freq: Once | SUBCUTANEOUS | Status: AC
  Administered 2016-04-19: 6 mg via SUBCUTANEOUS
  Filled 2016-04-19: qty 0.6

## 2016-04-19 NOTE — Patient Instructions (Signed)
Pegfilgrastim injection What is this medicine? PEGFILGRASTIM (PEG fil gra stim) is a long-acting granulocyte colony-stimulating factor that stimulates the growth of neutrophils, a type of white blood cell important in the body's fight against infection. It is used to reduce the incidence of fever and infection in patients with certain types of cancer who are receiving chemotherapy that affects the bone marrow, and to increase survival after being exposed to high doses of radiation. This medicine may be used for other purposes; ask your health care provider or pharmacist if you have questions. What should I tell my health care provider before I take this medicine? They need to know if you have any of these conditions: -kidney disease -latex allergy -ongoing radiation therapy -sickle cell disease -skin reactions to acrylic adhesives (On-Body Injector only) -an unusual or allergic reaction to pegfilgrastim, filgrastim, other medicines, foods, dyes, or preservatives -pregnant or trying to get pregnant -breast-feeding How should I use this medicine? This medicine is for injection under the skin. If you get this medicine at home, you will be taught how to prepare and give the pre-filled syringe or how to use the On-body Injector. Refer to the patient Instructions for Use for detailed instructions. Use exactly as directed. Take your medicine at regular intervals. Do not take your medicine more often than directed. It is important that you put your used needles and syringes in a special sharps container. Do not put them in a trash can. If you do not have a sharps container, call your pharmacist or healthcare provider to get one. Talk to your pediatrician regarding the use of this medicine in children. While this drug may be prescribed for selected conditions, precautions do apply. Overdosage: If you think you have taken too much of this medicine contact a poison control center or emergency room at  once. NOTE: This medicine is only for you. Do not share this medicine with others. What if I miss a dose? It is important not to miss your dose. Call your doctor or health care professional if you miss your dose. If you miss a dose due to an On-body Injector failure or leakage, a new dose should be administered as soon as possible using a single prefilled syringe for manual use. What may interact with this medicine? Interactions have not been studied. Give your health care provider a list of all the medicines, herbs, non-prescription drugs, or dietary supplements you use. Also tell them if you smoke, drink alcohol, or use illegal drugs. Some items may interact with your medicine. This list may not describe all possible interactions. Give your health care provider a list of all the medicines, herbs, non-prescription drugs, or dietary supplements you use. Also tell them if you smoke, drink alcohol, or use illegal drugs. Some items may interact with your medicine. What should I watch for while using this medicine? You may need blood work done while you are taking this medicine. If you are going to need a MRI, CT scan, or other procedure, tell your doctor that you are using this medicine (On-Body Injector only). What side effects may I notice from receiving this medicine? Side effects that you should report to your doctor or health care professional as soon as possible: -allergic reactions like skin rash, itching or hives, swelling of the face, lips, or tongue -dizziness -fever -pain, redness, or irritation at site where injected -pinpoint red spots on the skin -red or dark-brown urine -shortness of breath or breathing problems -stomach or side pain, or pain   at the shoulder -swelling -tiredness -trouble passing urine or change in the amount of urine Side effects that usually do not require medical attention (report to your doctor or health care professional if they continue or are  bothersome): -bone pain -muscle pain This list may not describe all possible side effects. Call your doctor for medical advice about side effects. You may report side effects to FDA at 1-800-FDA-1088. Where should I keep my medicine? Keep out of the reach of children. Store pre-filled syringes in a refrigerator between 2 and 8 degrees C (36 and 46 degrees F). Do not freeze. Keep in carton to protect from light. Throw away this medicine if it is left out of the refrigerator for more than 48 hours. Throw away any unused medicine after the expiration date. NOTE: This sheet is a summary. It may not cover all possible information. If you have questions about this medicine, talk to your doctor, pharmacist, or health care provider.    2016, Elsevier/Gold Standard. (2014-06-09 14:30:14)  

## 2016-04-20 ENCOUNTER — Encounter (HOSPITAL_COMMUNITY): Payer: Self-pay

## 2016-04-20 ENCOUNTER — Emergency Department (HOSPITAL_COMMUNITY)
Admission: EM | Admit: 2016-04-20 | Discharge: 2016-04-20 | Disposition: A | Discharge status: Home / Self Care | Payer: Medicare Other | Attending: Emergency Medicine | Admitting: Emergency Medicine

## 2016-04-20 DIAGNOSIS — Z87891 Personal history of nicotine dependence: Secondary | ICD-10-CM | POA: Insufficient documentation

## 2016-04-20 DIAGNOSIS — Z79899 Other long term (current) drug therapy: Secondary | ICD-10-CM | POA: Insufficient documentation

## 2016-04-20 DIAGNOSIS — Z85118 Personal history of other malignant neoplasm of bronchus and lung: Secondary | ICD-10-CM | POA: Diagnosis not present

## 2016-04-20 DIAGNOSIS — I252 Old myocardial infarction: Secondary | ICD-10-CM | POA: Diagnosis not present

## 2016-04-20 DIAGNOSIS — J449 Chronic obstructive pulmonary disease, unspecified: Secondary | ICD-10-CM | POA: Diagnosis not present

## 2016-04-20 DIAGNOSIS — N189 Chronic kidney disease, unspecified: Secondary | ICD-10-CM | POA: Diagnosis not present

## 2016-04-20 DIAGNOSIS — I129 Hypertensive chronic kidney disease with stage 1 through stage 4 chronic kidney disease, or unspecified chronic kidney disease: Secondary | ICD-10-CM | POA: Diagnosis not present

## 2016-04-20 DIAGNOSIS — Z955 Presence of coronary angioplasty implant and graft: Secondary | ICD-10-CM | POA: Insufficient documentation

## 2016-04-20 DIAGNOSIS — L03113 Cellulitis of right upper limb: Secondary | ICD-10-CM

## 2016-04-20 DIAGNOSIS — M7989 Other specified soft tissue disorders: Secondary | ICD-10-CM | POA: Diagnosis present

## 2016-04-20 DIAGNOSIS — I2510 Atherosclerotic heart disease of native coronary artery without angina pectoris: Secondary | ICD-10-CM | POA: Insufficient documentation

## 2016-04-20 MED ORDER — CEPHALEXIN 500 MG PO CAPS
500.0000 mg | ORAL_CAPSULE | Freq: Once | ORAL | Status: AC
  Administered 2016-04-20: 500 mg via ORAL
  Filled 2016-04-20: qty 1

## 2016-04-20 MED ORDER — CEPHALEXIN 500 MG PO CAPS: 500.0000 mg | ORAL_CAPSULE | Freq: Four times a day (QID) | ORAL | 0 refills | Status: DC

## 2016-04-20 NOTE — ED Triage Notes (Signed)
Pt presents with c/o right hand swelling. Pt reports that she had chemo this past Monday through an IV in her hand. Pt reports that she told the chemo nurse on that day that her hand was sore the entire chemo treatment. Since the treatment, pt has had hand swelling. Pt was told to come here today to be evaluated for a possible chemo reaction. Pt also reports that she was also given a chemo injection yesterday which usually makes her heart race. Pt denies racing heart at this time but reports she did feel that way after the reaction yesterday.
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
## 2016-04-20 NOTE — Discharge Instructions (Signed)
It was our pleasure to provide your ER care today - we hope that you feel better.  Take keflex (antibiotic) as prescribed.  Follow up with your doctor in the coming week if symptoms fail to improve/resolve.  Return to ER if worse, new symptoms, spreading redness, severe pain, high fevers, other concern.

## 2016-04-21 ENCOUNTER — Ambulatory Visit: Payer: Medicare Other

## 2016-04-22 ENCOUNTER — Encounter: Payer: Self-pay | Admitting: Radiation Oncology

## 2016-04-22 ENCOUNTER — Other Ambulatory Visit (HOSPITAL_BASED_OUTPATIENT_CLINIC_OR_DEPARTMENT_OTHER): Payer: Medicare Other

## 2016-04-22 ENCOUNTER — Ambulatory Visit
Admission: RE | Admit: 2016-04-22 | Discharge: 2016-04-22 | Disposition: A | Discharge status: Home / Self Care | Payer: Medicare Other | Source: Ambulatory Visit | Attending: Radiation Oncology | Admitting: Radiation Oncology

## 2016-04-22 VITALS — BP 113/59 | HR 92 | Temp 97.9°F | Resp 18 | Ht 65.5 in | Wt 127.4 lb

## 2016-04-22 DIAGNOSIS — C3491 Malignant neoplasm of unspecified part of right bronchus or lung: Secondary | ICD-10-CM | POA: Diagnosis not present

## 2016-04-22 DIAGNOSIS — Z923 Personal history of irradiation: Secondary | ICD-10-CM

## 2016-04-22 DIAGNOSIS — Z510 Encounter for antineoplastic radiation therapy: Secondary | ICD-10-CM | POA: Diagnosis not present

## 2016-04-22 DIAGNOSIS — C3490 Malignant neoplasm of unspecified part of unspecified bronchus or lung: Secondary | ICD-10-CM | POA: Diagnosis present

## 2016-04-22 DIAGNOSIS — Z9221 Personal history of antineoplastic chemotherapy: Secondary | ICD-10-CM | POA: Diagnosis not present

## 2016-04-22 DIAGNOSIS — R05 Cough: Secondary | ICD-10-CM | POA: Diagnosis not present

## 2016-04-22 DIAGNOSIS — C771 Secondary and unspecified malignant neoplasm of intrathoracic lymph nodes: Secondary | ICD-10-CM | POA: Insufficient documentation

## 2016-04-22 DIAGNOSIS — R070 Pain in throat: Secondary | ICD-10-CM | POA: Diagnosis not present

## 2016-04-22 LAB — CBC WITH DIFFERENTIAL/PLATELET
BASO%: 0.6 % (ref 0.0–2.0)
Basophils Absolute: 0.1 10*3/uL (ref 0.0–0.1)
EOS%: 0.2 % (ref 0.0–7.0)
Eosinophils Absolute: 0 10*3/uL (ref 0.0–0.5)
HCT: 35.1 % (ref 34.8–46.6)
HGB: 11.6 g/dL (ref 11.6–15.9)
LYMPH%: 6.3 % — ABNORMAL LOW (ref 14.0–49.7)
MCH: 30.2 pg (ref 25.1–34.0)
MCHC: 33 g/dL (ref 31.5–36.0)
MCV: 91.7 fL (ref 79.5–101.0)
MONO#: 0.3 10*3/uL (ref 0.1–0.9)
MONO%: 3.2 % (ref 0.0–14.0)
NEUT#: 7.7 10*3/uL — ABNORMAL HIGH (ref 1.5–6.5)
NEUT%: 89.7 % — ABNORMAL HIGH (ref 38.4–76.8)
Platelets: 128 10*3/uL — ABNORMAL LOW (ref 145–400)
RBC: 3.83 10*6/uL (ref 3.70–5.45)
RDW: 16.2 % — ABNORMAL HIGH (ref 11.2–14.5)
WBC: 8.6 10*3/uL (ref 3.9–10.3)
lymph#: 0.5 10*3/uL — ABNORMAL LOW (ref 0.9–3.3)

## 2016-04-22 LAB — COMPREHENSIVE METABOLIC PANEL
ALT: 10 U/L (ref 0–55)
AST: 23 U/L (ref 5–34)
Albumin: 3.6 g/dL (ref 3.5–5.0)
Alkaline Phosphatase: 83 U/L (ref 40–150)
Anion Gap: 9 mEq/L (ref 3–11)
BUN: 15.5 mg/dL (ref 7.0–26.0)
CO2: 25 mEq/L (ref 22–29)
Calcium: 10 mg/dL (ref 8.4–10.4)
Chloride: 106 mEq/L (ref 98–109)
Creatinine: 0.6 mg/dL (ref 0.6–1.1)
EGFR: >90 mL/min/{1.73_m2} (ref >90)
Glucose: 88 mg/dl (ref 70–140)
Potassium: 4.3 mEq/L (ref 3.5–5.1)
Sodium: 140 mEq/L (ref 136–145)
Total Bilirubin: 0.43 mg/dL (ref 0.20–1.20)
Total Protein: 6.3 g/dL — ABNORMAL LOW (ref 6.4–8.3)

## 2016-04-22 LAB — MAGNESIUM: Magnesium: 2 mg/dl (ref 1.5–2.5)

## 2016-04-22 MED ORDER — SONAFINE EX EMUL
1.0000 "application " | Freq: Once | CUTANEOUS | Status: AC
  Administered 2016-04-22: 1 via TOPICAL

## 2016-04-22 NOTE — Progress Notes (Signed)
Ms. Winslow presents for her 25th fraction of radiation to her Mediastinum. She reports mild pain in her Left Lung area and back. She will take hydrocodone occasionally for this pain. She reports fatigue. She will receive her 4d cycle of chemotherapy  05-06-16.   Her skin to her radiation area to her upper back is red. She is putting neosporin to the peeling areas healed on back and sonafine cream to the other areas. She has redness to her mid chest area, and is also using sonafine cream to this area. She has a decreased appetite, but is eating as well as she can. She will drink an Boost occasionally.  Wt Readings from Last 3 Encounters:  04/22/16 127 lb 6.4 oz (57.8 kg)  04/20/16 129 lb (58.5 kg)  04/15/16 129 lb 6.4 oz (58.7 kg)  BP (!) 113/59   Pulse 92   Temp 97.9 F (36.6 C) (Oral)   Resp 18   Ht 5' 5.5" (1.664 m)   Wt 127 lb 6.4 oz (57.8 kg)   SpO2 100%   BMI 20.88 kg/m

## 2016-04-22 NOTE — Progress Notes (Signed)
   Weekly Management Note:  outpatient    ICD-9-CM ICD-10-CM   1. 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  CBCT/MVCT images/Port film x-rays were reviewed.  The chart was checked.   She continues to have fluctuating heart rate after end of week infusions in med/onc.  No esophagitis   BP (!) 113/59   Pulse 92   Temp 97.9 F (36.6 C) (Oral)   Resp 18   Ht 5' 5.5" (1.664 m)   Wt 127 lb 6.4 oz (57.8 kg)   SpO2 100%   BMI 20.88 kg/m       Physical Findings:  Wt Readings from Last 3 Encounters:  04/22/16 127 lb 6.4 oz (57.8 kg)  04/20/16 129 lb (58.5 kg)  04/15/16 129 lb 6.4 oz (58.7 kg)    height is 5' 5.5" (1.664 m) and weight is 127 lb 6.4 oz (57.8 kg). Her oral temperature is 97.9 F (36.6 C). Her blood pressure is 113/59 (abnormal) and her pulse is 92. Her respiration is 18 and oxygen saturation is 100%.  NAD, ambulatory,  erythema and mild dermatitis over skin of torso  CBC    Component Value Date/Time   WBC 8.6 04/22/2016 1223   WBC 33.8 (H) 03/30/2016 1100   RBC 3.83 04/22/2016 1223   RBC 3.85 (L) 03/30/2016 1100   HGB 11.6 04/22/2016 1223   HCT 35.1 04/22/2016 1223   PLT 128 (L) 04/22/2016 1223   MCV 91.7 04/22/2016 1223   MCH 30.2 04/22/2016 1223   MCH 30.9 03/30/2016 1100   MCHC 33.0 04/22/2016 1223   MCHC 33.3 03/30/2016 1100   RDW 16.2 (H) 04/22/2016 1223   LYMPHSABS 0.5 (L) 04/22/2016 1223   MONOABS 0.3 04/22/2016 1223   EOSABS 0.0 04/22/2016 1223   BASOSABS 0.1 04/22/2016 1223     CMP     Component Value Date/Time   NA 140 04/22/2016 1223   K 4.3 04/22/2016 1223   CL 104 03/30/2016 1100   CO2 25 04/22/2016 1223   GLUCOSE 88 04/22/2016 1223   BUN 15.5 04/22/2016 1223   CREATININE 0.6 04/22/2016 1223   CALCIUM 10.0 04/22/2016 1223   PROT 6.3 (L) 04/22/2016 1223   ALBUMIN 3.6 04/22/2016 1223   AST 23 04/22/2016 1223   ALT 10 04/22/2016 1223   ALKPHOS 83 04/22/2016 1223   BILITOT 0.43 04/22/2016 1223   GFRNONAA >60 03/30/2016 1100   GFRAA >60 03/30/2016 1100     Impression:  The patient is tolerating radiotherapy   Plan:  Continue radiotherapy as planned.  Sucralfate PRN esophagitis.  I have advised her in the past to discuss her cardiac symptoms with med/onc and her cardiologist  and did so again today..  She has gone to the ED for them in the past.  She does not yet have a cardiac monitor but anticipates getting one. -----------------------------------  [name], MD

## 2016-04-23 ENCOUNTER — Ambulatory Visit
Admission: RE | Admit: 2016-04-23 | Discharge: 2016-04-23 | Disposition: A | Discharge status: Home / Self Care | Payer: Medicare Other | Source: Ambulatory Visit | Attending: Radiation Oncology | Admitting: Radiation Oncology

## 2016-04-23 ENCOUNTER — Telehealth: Payer: Self-pay | Admitting: Medical Oncology

## 2016-04-23 DIAGNOSIS — R070 Pain in throat: Secondary | ICD-10-CM | POA: Diagnosis not present

## 2016-04-23 DIAGNOSIS — C3491 Malignant neoplasm of unspecified part of right bronchus or lung: Secondary | ICD-10-CM | POA: Diagnosis not present

## 2016-04-23 DIAGNOSIS — Z510 Encounter for antineoplastic radiation therapy: Secondary | ICD-10-CM | POA: Diagnosis not present

## 2016-04-23 DIAGNOSIS — Z9221 Personal history of antineoplastic chemotherapy: Secondary | ICD-10-CM | POA: Diagnosis not present

## 2016-04-23 DIAGNOSIS — C771 Secondary and unspecified malignant neoplasm of intrathoracic lymph nodes: Secondary | ICD-10-CM | POA: Diagnosis not present

## 2016-04-23 DIAGNOSIS — R05 Cough: Secondary | ICD-10-CM | POA: Diagnosis not present

## 2016-04-23 NOTE — Telephone Encounter (Signed)
Pt states hand is better and is still taking antibiotic. She is having pain with swallowing usually with hot or cold food . She has not started carafate so I instructed her how to take it.

## 2016-04-24 ENCOUNTER — Telehealth: Payer: Self-pay | Admitting: Nurse Practitioner

## 2016-04-24 ENCOUNTER — Ambulatory Visit
Admission: RE | Admit: 2016-04-24 | Discharge: 2016-04-24 | Disposition: A | Discharge status: Home / Self Care | Payer: Medicare Other | Source: Ambulatory Visit | Attending: Radiation Oncology | Admitting: Radiation Oncology

## 2016-04-24 ENCOUNTER — Ambulatory Visit (HOSPITAL_BASED_OUTPATIENT_CLINIC_OR_DEPARTMENT_OTHER): Payer: Medicare Other | Admitting: Nurse Practitioner

## 2016-04-24 ENCOUNTER — Telehealth: Payer: Self-pay | Admitting: Medical Oncology

## 2016-04-24 ENCOUNTER — Encounter: Payer: Self-pay | Admitting: Nurse Practitioner

## 2016-04-24 ENCOUNTER — Encounter: Payer: Self-pay | Admitting: Internal Medicine

## 2016-04-24 VITALS — BP 98/53 | HR 80 | Temp 98.0°F | Resp 16 | Ht 65.5 in | Wt 127.8 lb

## 2016-04-24 DIAGNOSIS — T80810A Extravasation of vesicant antineoplastic chemotherapy, initial encounter: Secondary | ICD-10-CM | POA: Diagnosis not present

## 2016-04-24 DIAGNOSIS — R600 Localized edema: Secondary | ICD-10-CM

## 2016-04-24 DIAGNOSIS — C3491 Malignant neoplasm of unspecified part of right bronchus or lung: Secondary | ICD-10-CM

## 2016-04-24 DIAGNOSIS — Z510 Encounter for antineoplastic radiation therapy: Secondary | ICD-10-CM | POA: Diagnosis not present

## 2016-04-24 DIAGNOSIS — R070 Pain in throat: Secondary | ICD-10-CM | POA: Diagnosis not present

## 2016-04-24 DIAGNOSIS — T148XXA Other injury of unspecified body region, initial encounter: Secondary | ICD-10-CM

## 2016-04-24 DIAGNOSIS — C771 Secondary and unspecified malignant neoplasm of intrathoracic lymph nodes: Secondary | ICD-10-CM | POA: Diagnosis not present

## 2016-04-24 DIAGNOSIS — R05 Cough: Secondary | ICD-10-CM | POA: Diagnosis not present

## 2016-04-24 DIAGNOSIS — Z9221 Personal history of antineoplastic chemotherapy: Secondary | ICD-10-CM | POA: Diagnosis not present

## 2016-04-24 DIAGNOSIS — R002 Palpitations: Secondary | ICD-10-CM

## 2016-04-24 MED FILL — SUCRALFATE 1 GM TABLET: 1 | 10 days supply | Qty: 40 | Fill #0

## 2016-04-24 NOTE — Telephone Encounter (Signed)
Pt here for XRT- hand swollen again after antibiotics. [facility] today

## 2016-04-24 NOTE — Telephone Encounter (Signed)
Called patient to remind her that she has a cardiology appointment for this coming Monday, 04/29/2016.  There was no answer and a voicemail was left.

## 2016-04-24 NOTE — Assessment & Plan Note (Signed)
Patient complained of some edema and discomfort to the top of her right hand since receiving her chemotherapy peripherally to that right hand insertion site on Monday, 04/15/2016.  She denies any other new symptoms whatsoever.  She denies any recent fevers or chills.  Exam today reveals trace/mild edema to the patient's); but no erythema, warmth, tenderness, or red streaks.  Long discussion with patient regarding the possibility that this was indeed a chemotherapy extravasation-but unclear if this was related to the cisplatin or the etoposide.  Patient was advised to elevate her arm above the level of for heart when [facility] rest; and to use warm, moist compresses to the site as well.  Advised patient that she should call/return or go directly to the emergency department for any worsening symptoms whatsoever. _________________________________________  Update: Patient reports continued, mild edema to the top of her right hand following a probable extravasation incident last week when she received chemotherapy.  She also complains of some intermittent pain to her right upper arm above her elbow.  Of note-patient was seen in the emergency department last weekend for same complaint of edema to the right hand.  She was given an antibiotic for any questionable cellulitis to the hand.  However, patient states the antibiotic did not help with any of her symptoms.  She continues to deny any fevers or chills.  Exam today reveals there is only trace edema to the top of the patient's right hand at this point.  There is no other edema to the right upper extremity.  However, there is some mild tenderness of the right elbow region.  There is no warmth, erythema, or red streaks noted.  Patient was advised that she should undergo a Doppler ultrasound to rule out DVT to the right upper extremity; the patient refused to have this procedure today.  She instead would prefer to have the ultrasound on Friday.  Patient has been  scheduled for a right upper extremity Doppler ultrasound for Friday, 04/26/2016, at 1 PM.  Patient was advised  to go directly to the emergency department in the meantime with any new worries or concerns whatsoever.

## 2016-04-24 NOTE — Assessment & Plan Note (Signed)
chest pain, chest pressure, shortness breath, or pain with inspiration.   exam today reveals patient in no acute distress whatsoever.  Breath sounds are clear bilaterally.  There is no obvious shortness of breath.  Patient appears comfortable.  Also, heart rate was 76 and regular.  On exam.  Discussed option of obtaining an EKG today to further evaluate; but patient refused.  Patient stated that she has an appointment with her cardiologist Dr. [name].  Strongly advised patient to keep her appointment with her cardiologist next week.  Also, patient was advised to go directed to [facility] emergency department for any worsening symptoms whatsoever.

## 2016-04-24 NOTE — Assessment & Plan Note (Signed)
Patient received cycle 3 of her cisplatin/etoposide chemotherapy regimen on 04/15/2016.  She also continues to take radiation treatments on a daily basis.  Patient is scheduled for Doppler ultrasound of her right upper extremity on 04/26/2016.  She is scheduled for labs and a cardiologist appointment on 04/29/2016.  She is scheduled for labs, visit, and her next cycle of chemotherapy on 05/06/2016.
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
## 2016-04-26 ENCOUNTER — Telehealth: Payer: Self-pay | Admitting: *Deleted

## 2016-04-26 ENCOUNTER — Ambulatory Visit (HOSPITAL_COMMUNITY)
Admission: RE | Admit: 2016-04-26 | Discharge: 2016-04-26 | Disposition: A | Discharge status: Home / Self Care | Payer: Medicare Other | Source: Ambulatory Visit | Attending: Nurse Practitioner | Admitting: Nurse Practitioner

## 2016-04-26 ENCOUNTER — Other Ambulatory Visit: Payer: Self-pay | Admitting: *Deleted

## 2016-04-26 ENCOUNTER — Ambulatory Visit: Payer: Medicare Other

## 2016-04-26 DIAGNOSIS — C3491 Malignant neoplasm of unspecified part of right bronchus or lung: Secondary | ICD-10-CM | POA: Insufficient documentation

## 2016-04-26 DIAGNOSIS — I82611 Acute embolism and thrombosis of superficial veins of right upper extremity: Secondary | ICD-10-CM | POA: Insufficient documentation

## 2016-04-26 DIAGNOSIS — B379 Candidiasis, unspecified: Secondary | ICD-10-CM

## 2016-04-26 MED ORDER — FLUCONAZOLE 150 MG PO TABS: 150.0000 mg | ORAL_TABLET | Freq: Every day | ORAL | 1 refills | Status: AC

## 2016-04-26 NOTE — Telephone Encounter (Signed)
TCT patient with results of today's doppler study of her RUE. Study revealed small clot in her right dorasl hand and a superficial  Small clot in right upper arm. Explained treatment for this is warm comparesses and elevation of affected extremity.  Pt voiced understanding. She also voiced that she has developed a vaginal yeast infection while on the Keflex.  Discussed this with Selena Lesser, NP. Received order for diflucan 150 mg x1 dose with 1 refill. This was escribed to pt's pharmacy. Pt voiced understanding of this prescription.

## 2016-04-26 NOTE — Progress Notes (Signed)
*  Preliminary Results* Right upper extremity venous duplex completed. Right upper extremity is negative for deep vein thrombosis. There is evidence of right upper extremity superficial vein thrombosis involving the right cephalic vein and a superficial vein of the anterior hand.  Preliminary results discussed with Selena Lesser, NP.  04/26/2016 1:33 PM  Maudry Mayhew, BS, RVT, RDCS, RDMS

## 2016-04-29 ENCOUNTER — Encounter: Payer: Self-pay | Admitting: Cardiology

## 2016-04-29 ENCOUNTER — Ambulatory Visit
Admission: RE | Admit: 2016-04-29 | Discharge: 2016-04-29 | Disposition: A | Discharge status: Home / Self Care | Payer: Medicare Other | Source: Ambulatory Visit | Attending: Radiation Oncology | Admitting: Radiation Oncology

## 2016-04-29 ENCOUNTER — Encounter: Payer: Self-pay | Admitting: Radiation Oncology

## 2016-04-29 ENCOUNTER — Telehealth: Payer: Self-pay | Admitting: Internal Medicine

## 2016-04-29 ENCOUNTER — Ambulatory Visit (INDEPENDENT_AMBULATORY_CARE_PROVIDER_SITE_OTHER): Payer: Medicare Other | Admitting: Cardiology

## 2016-04-29 ENCOUNTER — Other Ambulatory Visit (HOSPITAL_BASED_OUTPATIENT_CLINIC_OR_DEPARTMENT_OTHER): Payer: Medicare Other

## 2016-04-29 VITALS — BP 121/78 | HR 71 | Ht 65.5 in | Wt 131.2 lb

## 2016-04-29 VITALS — BP 121/63 | HR 76 | Temp 98.4°F | Resp 18 | Ht 65.5 in | Wt 131.0 lb

## 2016-04-29 DIAGNOSIS — Z9861 Coronary angioplasty status: Secondary | ICD-10-CM

## 2016-04-29 DIAGNOSIS — C3490 Malignant neoplasm of unspecified part of unspecified bronchus or lung: Secondary | ICD-10-CM | POA: Diagnosis present

## 2016-04-29 DIAGNOSIS — I10 Essential (primary) hypertension: Secondary | ICD-10-CM | POA: Diagnosis not present

## 2016-04-29 DIAGNOSIS — Z9221 Personal history of antineoplastic chemotherapy: Secondary | ICD-10-CM | POA: Diagnosis not present

## 2016-04-29 DIAGNOSIS — C771 Secondary and unspecified malignant neoplasm of intrathoracic lymph nodes: Secondary | ICD-10-CM

## 2016-04-29 DIAGNOSIS — Z510 Encounter for antineoplastic radiation therapy: Secondary | ICD-10-CM | POA: Diagnosis not present

## 2016-04-29 DIAGNOSIS — I208 Other forms of angina pectoris: Secondary | ICD-10-CM

## 2016-04-29 DIAGNOSIS — I201 Angina pectoris with documented spasm: Secondary | ICD-10-CM | POA: Diagnosis not present

## 2016-04-29 DIAGNOSIS — R002 Palpitations: Secondary | ICD-10-CM | POA: Diagnosis not present

## 2016-04-29 DIAGNOSIS — I2510 Atherosclerotic heart disease of native coronary artery without angina pectoris: Secondary | ICD-10-CM | POA: Diagnosis not present

## 2016-04-29 DIAGNOSIS — R070 Pain in throat: Secondary | ICD-10-CM | POA: Diagnosis not present

## 2016-04-29 DIAGNOSIS — C3491 Malignant neoplasm of unspecified part of right bronchus or lung: Secondary | ICD-10-CM | POA: Diagnosis not present

## 2016-04-29 DIAGNOSIS — R05 Cough: Secondary | ICD-10-CM | POA: Diagnosis not present

## 2016-04-29 LAB — COMPREHENSIVE METABOLIC PANEL
ALT: 10 U/L (ref 0–55)
AST: 25 U/L (ref 5–34)
Albumin: 3.5 g/dL (ref 3.5–5.0)
Alkaline Phosphatase: 83 U/L (ref 40–150)
Anion Gap: 7 mEq/L (ref 3–11)
BUN: 8.1 mg/dL (ref 7.0–26.0)
CO2: 28 mEq/L (ref 22–29)
Calcium: 9.7 mg/dL (ref 8.4–10.4)
Chloride: 107 mEq/L (ref 98–109)
Creatinine: 0.6 mg/dL (ref 0.6–1.1)
EGFR: >90 mL/min/{1.73_m2} (ref >90)
Glucose: 85 mg/dl (ref 70–140)
Potassium: 3.8 mEq/L (ref 3.5–5.1)
Sodium: 142 mEq/L (ref 136–145)
Total Bilirubin: 0.32 mg/dL (ref 0.20–1.20)
Total Protein: 6.3 g/dL — ABNORMAL LOW (ref 6.4–8.3)

## 2016-04-29 LAB — CBC WITH DIFFERENTIAL/PLATELET
BASO%: 0.3 % (ref 0.0–2.0)
Basophils Absolute: 0 10*3/uL (ref 0.0–0.1)
EOS%: 0.7 % (ref 0.0–7.0)
Eosinophils Absolute: 0 10*3/uL (ref 0.0–0.5)
HCT: 31.7 % — ABNORMAL LOW (ref 34.8–46.6)
HGB: 10.5 g/dL — ABNORMAL LOW (ref 11.6–15.9)
LYMPH%: 9.9 % — ABNORMAL LOW (ref 14.0–49.7)
MCH: 30.6 pg (ref 25.1–34.0)
MCHC: 33.1 g/dL (ref 31.5–36.0)
MCV: 92.6 fL (ref 79.5–101.0)
MONO#: 0.4 10*3/uL (ref 0.1–0.9)
MONO%: 7.5 % (ref 0.0–14.0)
NEUT#: 4.4 10*3/uL (ref 1.5–6.5)
NEUT%: 81.6 % — ABNORMAL HIGH (ref 38.4–76.8)
Platelets: 102 10*3/uL — ABNORMAL LOW (ref 145–400)
RBC: 3.43 10*6/uL — ABNORMAL LOW (ref 3.70–5.45)
RDW: 17.3 % — ABNORMAL HIGH (ref 11.2–14.5)
WBC: 5.4 10*3/uL (ref 3.9–10.3)
lymph#: 0.5 10*3/uL — ABNORMAL LOW (ref 0.9–3.3)

## 2016-04-29 LAB — MAGNESIUM: Magnesium: 2.2 mg/dl (ref 1.5–2.5)

## 2016-04-29 MED ORDER — METOPROLOL TARTRATE 25 MG PO TABS: 25.0000 mg | ORAL_TABLET | Freq: Two times a day (BID) | ORAL | 6 refills | Status: DC | PRN
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
## 2016-04-29 NOTE — Telephone Encounter (Signed)
Patient stopped by for a copy of appointment schedule. Copy of appointment schedule was given to patient. 04/29/16

## 2016-04-29 NOTE — Patient Instructions (Addendum)
START DAY OF INJECTION CHEMO.  METOPROLOL TARTRATE 25 MG  IN THE MORNING AND NIGHT FOR 3 DAYS , THEN ON THE THIRD DAY TAKE IN THE MORNING.  OTHERWISE TAKE 1 TABLET OF METOPROLOL TARTRATE IF YOU HAVE A SPELL OF FAST HEARTBEAT.   NO OTHER CHANGES   WILL CANCEL EVENT/HOLTER MONITOR SCHEDULE FOR 04/30/16.   Your physician wants you to follow-up in: [place].- 30 MIN You will receive a reminder letter in the mail two months in advance. If you don't receive a letter, please call our office to schedule the follow-up appointment.  If you need a refill on your cardiac medications before your next appointment, please call your pharmacy.

## 2016-04-29 NOTE — Progress Notes (Addendum)
   Weekly Management Note:  outpatient    ICD-9-CM ICD-10-CM   1. 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  CBCT/MVCT images/Port film x-rays were reviewed.  The chart was checked.   She had a superficial venous blood clot in her RUE recently - we reviewed her [place] report today She sees cardiology soon. She has 24 hour history of pain in her anterior lower left shin. Tired. Boost supplements, decreased appetite.  BP 121/63   Pulse 76   Temp 98.4 F (36.9 C) (Oral)   Resp 18   Ht 5' 5.5" (1.664 m)   Wt 131 lb (59.4 kg)   SpO2 100%   BMI 21.47 kg/m       Physical Findings:  Wt Readings from Last 3 Encounters:  04/29/16 131 lb 3.2 oz (59.5 kg)  04/29/16 131 lb (59.4 kg)  04/24/16 127 lb 12.8 oz (58 kg)    height is 5' 5.5" (1.664 m) and weight is 131 lb (59.4 kg). Her oral temperature is 98.4 F (36.9 C). Her blood pressure is 121/63 and her pulse is 76. Her respiration is 18 and oxygen saturation is 100%.  NAD, ambulatory,  erythema and mild dermatitis over skin of torso. Bruises over hands.  Tender over lower left shin, no swelling in LLE and no calf tenderness.  CBC    Component Value Date/Time   WBC 5.4 04/29/2016 1317   WBC 33.8 (H) 03/30/2016 1100   RBC 3.43 (L) 04/29/2016 1317   RBC 3.85 (L) 03/30/2016 1100   HGB 10.5 (L) 04/29/2016 1317   HCT 31.7 (L) 04/29/2016 1317   PLT 102 (L) 04/29/2016 1317   MCV 92.6 04/29/2016 1317   MCH 30.6 04/29/2016 1317   MCH 30.9 03/30/2016 1100   MCHC 33.1 04/29/2016 1317   MCHC 33.3 03/30/2016 1100   RDW 17.3 (H) 04/29/2016 1317   LYMPHSABS 0.5 (L) 04/29/2016 1317   MONOABS 0.4 04/29/2016 1317   EOSABS 0.0 04/29/2016 1317   BASOSABS 0.0 04/29/2016 1317     CMP     Component Value Date/Time   NA 142 04/29/2016 1317   K 3.8 04/29/2016 1317   CL 104 03/30/2016 1100   CO2 28  04/29/2016 1317   GLUCOSE 85 04/29/2016 1317   BUN 8.1 04/29/2016 1317   CREATININE 0.6 04/29/2016 1317   CALCIUM 9.7 04/29/2016 1317   PROT 6.3 (L) 04/29/2016 1317   ALBUMIN 3.5 04/29/2016 1317   AST 25 04/29/2016 1317   ALT 10 04/29/2016 1317   ALKPHOS 83 04/29/2016 1317   BILITOT 0.32 04/29/2016 1317   GFRNONAA >60 03/30/2016 1100   GFRAA >60 03/30/2016 1100     Impression:  The patient is tolerating radiotherapy   Plan:  Continue radiotherapy as planned.  Sucralfate PRN esophagitis.  Pt to let an MD know if her left shin pain worsens or persists.  Right not the likelihood of a DVT in that area is low. -----------------------------------  [name], MD

## 2016-04-29 NOTE — Progress Notes (Addendum)
Michele [name] presents for her 28th fraction of radiation to her Mediastinum. She reports mild pain in her Left Lung area and back. Had one area on the upper back that opened put neosporin ointment on the area noticed mild redness.  She will take hydrocodone occasionally for this pain. She reports fatigue. She will receive her 4th cycle of chemotherapy  05-06-16.   [name] She is putting neosporin to the peeling areas healed on back and sonafine cream to the other areas. She has redness to her mid chest area, and is also using sonafine cream to this area. She has a decreased appetite, but is eating as well as she can. She will drink an Boost occasionally.  Reports she had an U/S of her right hand and right arm stated it showed she had blood clots ordered by Drue Second, N.P. Wt Readings from Last 3 Encounters:  04/29/16 137 lb (62.1 kg)  04/24/16 127 lb 12.8 oz (58 kg)  04/22/16 127 lb 6.4 oz (57.8 kg)  BP 121/63   Pulse 76   Temp 98.4 F (36.9 C) (Oral)   Resp 18   Ht 5' 5.5" (1.664 m)   Wt 131 lb (59.4 kg)   SpO2 100%   BMI 21.47 kg/m

## 2016-04-30 ENCOUNTER — Ambulatory Visit: Payer: Medicare Other

## 2016-04-30 ENCOUNTER — Ambulatory Visit
Admission: RE | Admit: 2016-04-30 | Discharge: 2016-04-30 | Disposition: A | Discharge status: Home / Self Care | Payer: Medicare Other | Source: Ambulatory Visit | Attending: Radiation Oncology | Admitting: Radiation Oncology

## 2016-04-30 DIAGNOSIS — Z9221 Personal history of antineoplastic chemotherapy: Secondary | ICD-10-CM | POA: Diagnosis not present

## 2016-04-30 DIAGNOSIS — C3491 Malignant neoplasm of unspecified part of right bronchus or lung: Secondary | ICD-10-CM | POA: Diagnosis not present

## 2016-04-30 DIAGNOSIS — Z510 Encounter for antineoplastic radiation therapy: Secondary | ICD-10-CM | POA: Diagnosis not present

## 2016-04-30 DIAGNOSIS — R070 Pain in throat: Secondary | ICD-10-CM | POA: Diagnosis not present

## 2016-04-30 DIAGNOSIS — C771 Secondary and unspecified malignant neoplasm of intrathoracic lymph nodes: Secondary | ICD-10-CM | POA: Diagnosis not present

## 2016-04-30 DIAGNOSIS — R05 Cough: Secondary | ICD-10-CM | POA: Diagnosis not present

## 2016-05-01 ENCOUNTER — Ambulatory Visit: Payer: Medicare Other

## 2016-05-01 ENCOUNTER — Ambulatory Visit
Admission: RE | Admit: 2016-05-01 | Discharge: 2016-05-01 | Disposition: A | Discharge status: Home / Self Care | Payer: Medicare Other | Source: Ambulatory Visit | Attending: Radiation Oncology | Admitting: Radiation Oncology

## 2016-05-01 DIAGNOSIS — Z9221 Personal history of antineoplastic chemotherapy: Secondary | ICD-10-CM | POA: Diagnosis not present

## 2016-05-01 DIAGNOSIS — R070 Pain in throat: Secondary | ICD-10-CM | POA: Diagnosis not present

## 2016-05-01 DIAGNOSIS — C3491 Malignant neoplasm of unspecified part of right bronchus or lung: Secondary | ICD-10-CM | POA: Diagnosis not present

## 2016-05-01 DIAGNOSIS — R05 Cough: Secondary | ICD-10-CM | POA: Diagnosis not present

## 2016-05-01 DIAGNOSIS — Z510 Encounter for antineoplastic radiation therapy: Secondary | ICD-10-CM | POA: Diagnosis not present

## 2016-05-01 DIAGNOSIS — C771 Secondary and unspecified malignant neoplasm of intrathoracic lymph nodes: Secondary | ICD-10-CM | POA: Diagnosis not present

## 2016-05-01 NOTE — Progress Notes (Signed)
Ms. Brodbeck presented to nursing today before her radiation treatment. She reports pain from her mid lower chest area which extends underneath her Left Breast. She reports it started last evening after she ate some spaghetti. She started taking carafate yesterday as she had been complaining of continued epigastric pain over the past few days. Her vital signs are as follows: BP 104/61, pulse 89 and regular, temperature 97.8, and pulse ox 99 on room air. I offered to make an appointment for her with Selena Lesser NP today to have her evaluated, but she declined. She feels like she is able to have her radiation treatment today. She is aware that if she begins to feels worse, to call me or go to the Emergency Room if needed.   BP 104/61   Pulse 89   Temp 97.8 F (36.6 C)   SpO2 99% Comment: room air

## 2016-05-01 NOTE — Assessment & Plan Note (Signed)
I really don't think her current symptoms are truly anginal symptoms. Her Myoview is negative for ischemia. Unfortunately she is really intolerant of most medications. She is now taking Imdur.  She is actually on carvedilol which I did not realize during his visit. Is also on statin and Plavix.

## 2016-05-01 NOTE — Assessment & Plan Note (Addendum)
She has these palpitation episodes that are usually around her Neulasta. I think there probably PACs that she is feeling. We have shown that she had PVCs and PACs in the past. I don't think is a true arrhythmia because it's so localized timing. She is on carvedilol, we will also provide when necessary metoprolol to use for having the symptoms. For now she will use them simply in the duration around the Neulasta treatment because it's not associated with any other timeframe.  START DAY OF INJECTION CHEMO.  METOPROLOL TARTRATE 25 MG  IN THE MORNING AND NIGHT FOR 3 DAYS , THEN ON THE THIRD DAY TAKE IN THE MORNING.  OTHERWISE TAKE 1 TABLET OF METOPROLOL TARTRATE IF YOU HAVE A SPELL OF FAST HEARTBEAT.

## 2016-05-01 NOTE — Assessment & Plan Note (Signed)
Stable now.

## 2016-05-01 NOTE — Assessment & Plan Note (Signed)
No longer on Ranexa. On Imdur.

## 2016-05-01 NOTE — Assessment & Plan Note (Signed)
Multiple stents included now basically occluded circumflex system. Myoview interestingly did not show any ischemia or infarction which is somewhat surprising. Was also relatively stable and preserved. On Plavix statin and low-dose beta blocker. Also on Imdur.

## 2016-05-02 ENCOUNTER — Ambulatory Visit (HOSPITAL_BASED_OUTPATIENT_CLINIC_OR_DEPARTMENT_OTHER): Payer: Medicare Other | Admitting: Nurse Practitioner

## 2016-05-02 ENCOUNTER — Ambulatory Visit (HOSPITAL_COMMUNITY)
Admission: RE | Admit: 2016-05-02 | Discharge: 2016-05-02 | Disposition: A | Discharge status: Home / Self Care | Payer: Medicare Other | Source: Ambulatory Visit | Attending: Nurse Practitioner | Admitting: Nurse Practitioner

## 2016-05-02 ENCOUNTER — Encounter: Payer: Self-pay | Admitting: Nurse Practitioner

## 2016-05-02 ENCOUNTER — Ambulatory Visit
Admission: RE | Admit: 2016-05-02 | Discharge: 2016-05-02 | Disposition: A | Discharge status: Home / Self Care | Payer: Medicare Other | Source: Ambulatory Visit | Attending: Radiation Oncology | Admitting: Radiation Oncology

## 2016-05-02 VITALS — BP 112/65 | HR 75 | Temp 97.7°F | Resp 16 | Ht 65.5 in | Wt 130.2 lb

## 2016-05-02 DIAGNOSIS — C3491 Malignant neoplasm of unspecified part of right bronchus or lung: Secondary | ICD-10-CM | POA: Diagnosis not present

## 2016-05-02 DIAGNOSIS — C771 Secondary and unspecified malignant neoplasm of intrathoracic lymph nodes: Secondary | ICD-10-CM | POA: Diagnosis not present

## 2016-05-02 DIAGNOSIS — C3492 Malignant neoplasm of unspecified part of left bronchus or lung: Secondary | ICD-10-CM

## 2016-05-02 DIAGNOSIS — R05 Cough: Secondary | ICD-10-CM | POA: Diagnosis not present

## 2016-05-02 DIAGNOSIS — M1732 Unilateral post-traumatic osteoarthritis, left knee: Secondary | ICD-10-CM | POA: Insufficient documentation

## 2016-05-02 DIAGNOSIS — Z9221 Personal history of antineoplastic chemotherapy: Secondary | ICD-10-CM | POA: Diagnosis not present

## 2016-05-02 DIAGNOSIS — M79605 Pain in left leg: Secondary | ICD-10-CM | POA: Diagnosis not present

## 2016-05-02 DIAGNOSIS — R52 Pain, unspecified: Secondary | ICD-10-CM | POA: Insufficient documentation

## 2016-05-02 DIAGNOSIS — R070 Pain in throat: Secondary | ICD-10-CM | POA: Diagnosis not present

## 2016-05-02 DIAGNOSIS — Z510 Encounter for antineoplastic radiation therapy: Secondary | ICD-10-CM | POA: Diagnosis not present

## 2016-05-02 DIAGNOSIS — M79662 Pain in left lower leg: Secondary | ICD-10-CM | POA: Diagnosis not present

## 2016-05-02 NOTE — Progress Notes (Signed)
**  Preliminary report by tech**  Left lower extremity venous duplex complete. There is no evidence of deep or superficial vein thrombosis involving the left lower extremity. All visualized vessels appear patent and compressible. There is no evidence of a Baker's cyst on the left. Incidental findings are consistent with: suspicious area on the left anterior ankle of unknown etiology. Results were given to Drue Second.  05/02/16 1:26 PM Michele [name] RVT
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
## 2016-05-02 NOTE — Assessment & Plan Note (Signed)
Patient received cycle 3 of her cisplatin/etoposide chemotherapy regimen on 04/15/2016.  Chest continues with radiation treatments on a daily basis.  Her final radiation treatment is scheduled for 05/06/2016.  She is scheduled to return on 05/06/2016 for labs, visit, and chemotherapy as well.
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
## 2016-05-03 ENCOUNTER — Ambulatory Visit: Payer: Medicare Other

## 2016-05-03 ENCOUNTER — Ambulatory Visit
Admission: RE | Admit: 2016-05-03 | Discharge: 2016-05-03 | Disposition: A | Discharge status: Home / Self Care | Payer: Medicare Other | Source: Ambulatory Visit | Attending: Radiation Oncology | Admitting: Radiation Oncology

## 2016-05-03 ENCOUNTER — Other Ambulatory Visit: Payer: Self-pay | Admitting: Cardiology

## 2016-05-03 ENCOUNTER — Other Ambulatory Visit: Payer: Self-pay

## 2016-05-03 DIAGNOSIS — R070 Pain in throat: Secondary | ICD-10-CM | POA: Diagnosis not present

## 2016-05-03 DIAGNOSIS — C3491 Malignant neoplasm of unspecified part of right bronchus or lung: Secondary | ICD-10-CM | POA: Diagnosis not present

## 2016-05-03 DIAGNOSIS — Z9221 Personal history of antineoplastic chemotherapy: Secondary | ICD-10-CM | POA: Diagnosis not present

## 2016-05-03 DIAGNOSIS — C771 Secondary and unspecified malignant neoplasm of intrathoracic lymph nodes: Secondary | ICD-10-CM | POA: Diagnosis not present

## 2016-05-03 DIAGNOSIS — R05 Cough: Secondary | ICD-10-CM | POA: Diagnosis not present

## 2016-05-03 DIAGNOSIS — Z510 Encounter for antineoplastic radiation therapy: Secondary | ICD-10-CM | POA: Diagnosis not present

## 2016-05-03 MED ORDER — NITROGLYCERIN 0.4 MG SL SUBL: 0.4000 mg | SUBLINGUAL_TABLET | SUBLINGUAL | 1 refills | Status: DC | PRN

## 2016-05-06 ENCOUNTER — Ambulatory Visit: Payer: Medicare Other

## 2016-05-06 ENCOUNTER — Ambulatory Visit (HOSPITAL_BASED_OUTPATIENT_CLINIC_OR_DEPARTMENT_OTHER): Payer: Medicare Other | Admitting: Internal Medicine

## 2016-05-06 ENCOUNTER — Ambulatory Visit: Payer: Medicare Other | Admitting: Nurse Practitioner

## 2016-05-06 ENCOUNTER — Ambulatory Visit
Admission: RE | Admit: 2016-05-06 | Discharge: 2016-05-06 | Disposition: A | Discharge status: Home / Self Care | Payer: Medicare Other | Source: Ambulatory Visit | Attending: Radiation Oncology | Admitting: Radiation Oncology

## 2016-05-06 ENCOUNTER — Other Ambulatory Visit (HOSPITAL_BASED_OUTPATIENT_CLINIC_OR_DEPARTMENT_OTHER): Payer: Medicare Other

## 2016-05-06 ENCOUNTER — Telehealth: Payer: Self-pay | Admitting: Internal Medicine

## 2016-05-06 ENCOUNTER — Encounter: Payer: Self-pay | Admitting: Radiation Oncology

## 2016-05-06 ENCOUNTER — Encounter: Payer: Self-pay | Admitting: Internal Medicine

## 2016-05-06 ENCOUNTER — Ambulatory Visit: Admission: RE | Admit: 2016-05-06 | Payer: Medicare Other | Source: Ambulatory Visit

## 2016-05-06 ENCOUNTER — Other Ambulatory Visit: Payer: Self-pay | Admitting: Radiation Oncology

## 2016-05-06 ENCOUNTER — Telehealth: Payer: Self-pay | Admitting: Cardiology

## 2016-05-06 ENCOUNTER — Encounter: Payer: Medicare Other | Admitting: Nutrition

## 2016-05-06 VITALS — Temp 98.3°F | Ht 65.5 in | Wt 129.0 lb

## 2016-05-06 VITALS — BP 96/54 | HR 74 | Temp 97.6°F | Resp 18 | Ht 65.5 in | Wt 129.6 lb

## 2016-05-06 DIAGNOSIS — Z9221 Personal history of antineoplastic chemotherapy: Secondary | ICD-10-CM | POA: Diagnosis not present

## 2016-05-06 DIAGNOSIS — Z510 Encounter for antineoplastic radiation therapy: Secondary | ICD-10-CM | POA: Diagnosis not present

## 2016-05-06 DIAGNOSIS — C771 Secondary and unspecified malignant neoplasm of intrathoracic lymph nodes: Secondary | ICD-10-CM | POA: Diagnosis not present

## 2016-05-06 DIAGNOSIS — C3491 Malignant neoplasm of unspecified part of right bronchus or lung: Secondary | ICD-10-CM

## 2016-05-06 DIAGNOSIS — Z5111 Encounter for antineoplastic chemotherapy: Secondary | ICD-10-CM

## 2016-05-06 DIAGNOSIS — R591 Generalized enlarged lymph nodes: Secondary | ICD-10-CM

## 2016-05-06 DIAGNOSIS — R05 Cough: Secondary | ICD-10-CM | POA: Diagnosis not present

## 2016-05-06 DIAGNOSIS — C3490 Malignant neoplasm of unspecified part of unspecified bronchus or lung: Secondary | ICD-10-CM | POA: Diagnosis not present

## 2016-05-06 DIAGNOSIS — R070 Pain in throat: Secondary | ICD-10-CM | POA: Diagnosis not present

## 2016-05-06 LAB — COMPREHENSIVE METABOLIC PANEL
ALT: 8 U/L (ref 0–55)
AST: 24 U/L (ref 5–34)
Albumin: 3.5 g/dL (ref 3.5–5.0)
Alkaline Phosphatase: 79 U/L (ref 40–150)
Anion Gap: 9 mEq/L (ref 3–11)
BUN: 9.1 mg/dL (ref 7.0–26.0)
CO2: 26 mEq/L (ref 22–29)
Calcium: 9.7 mg/dL (ref 8.4–10.4)
Chloride: 108 mEq/L (ref 98–109)
Creatinine: 0.6 mg/dL (ref 0.6–1.1)
EGFR: >90 mL/min/{1.73_m2} (ref >90)
Glucose: 90 mg/dl (ref 70–140)
Potassium: 4 mEq/L (ref 3.5–5.1)
Sodium: 142 mEq/L (ref 136–145)
Total Bilirubin: 0.32 mg/dL (ref 0.20–1.20)
Total Protein: 6.5 g/dL (ref 6.4–8.3)

## 2016-05-06 LAB — CBC WITH DIFFERENTIAL/PLATELET
BASO%: 0.5 % (ref 0.0–2.0)
Basophils Absolute: 0 10*3/uL (ref 0.0–0.1)
EOS%: 0.3 % (ref 0.0–7.0)
Eosinophils Absolute: 0 10*3/uL (ref 0.0–0.5)
HCT: 33.6 % — ABNORMAL LOW (ref 34.8–46.6)
HGB: 11.1 g/dL — ABNORMAL LOW (ref 11.6–15.9)
LYMPH%: 8.6 % — ABNORMAL LOW (ref 14.0–49.7)
MCH: 31.1 pg (ref 25.1–34.0)
MCHC: 33.1 g/dL (ref 31.5–36.0)
MCV: 93.9 fL (ref 79.5–101.0)
MONO#: 0.5 10*3/uL (ref 0.1–0.9)
MONO%: 9.4 % (ref 0.0–14.0)
NEUT#: 4.7 10*3/uL (ref 1.5–6.5)
NEUT%: 81.2 % — ABNORMAL HIGH (ref 38.4–76.8)
Platelets: 259 10*3/uL (ref 145–400)
RBC: 3.58 10*6/uL — ABNORMAL LOW (ref 3.70–5.45)
RDW: 18.1 % — ABNORMAL HIGH (ref 11.2–14.5)
WBC: 5.8 10*3/uL (ref 3.9–10.3)
lymph#: 0.5 10*3/uL — ABNORMAL LOW (ref 0.9–3.3)

## 2016-05-06 LAB — MAGNESIUM: Magnesium: 2.1 mg/dl (ref 1.5–2.5)

## 2016-05-06 NOTE — Progress Notes (Signed)
Michele [name] is here for her last fraction of radiation to her Mediastinum. She reports pain to the left side of her neck. She reports a swollen lymph node in this area. She reports eating well. She is not drinking supplements at this time because of the sugar content. She reports some pain while swallowing, but she continues to use the carafate regularly. She has some mild redness to her radiation site, and she is using the sonafine twice daily, but has not used it this past weekend. She is scheduled for her last chemotherapy this week. She was given a follow up appointment today, and also knows to continue using the sonafine for several more weeks.   Temp 98.3 F (36.8 C)   Ht 5' 5.5" (1.664 m)   Wt 129 lb (58.5 kg)   SpO2 100% Comment: room air  BMI 21.14 kg/m   Othostatics: BP sitting 93/60 pulse 84, BP standing 103/69 pulse 86  Wt Readings from Last 3 Encounters:  05/06/16 129 lb (58.5 kg)  05/02/16 130 lb 3.2 oz (59.1 kg)  04/29/16 131 lb (59.4 kg)

## 2016-05-06 NOTE — Telephone Encounter (Signed)
I spent an hour discussing this with the patient in the clinic.  No will not make her heart rate go to slow if she only takes it for this episode. She is already on carvedilol this is a as needed to take because she says she has palpitations with Neulasta & she is not doing it otherwise.  My note explains all of this.    Glenetta Hew, MD

## 2016-05-06 NOTE — Telephone Encounter (Signed)
Appointments scheduled per 12/4 LOS. Patient given AVS report and calendars with future scheduled appointments. Patient aware of CT scan. Will pick up contrast closer to appointment date.
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
## 2016-05-06 NOTE — Progress Notes (Signed)
   Weekly Management Note:  outpatient    ICD-9-CM ICD-10-CM   1. 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  CBCT/MVCT images/Port film x-rays were reviewed.  The chart was checked. She subjectively feels an enlarged mass (tender) in the left upper neck. She has a dry cough.   Temp 98.3 F (36.8 C)   Ht 5' 5.5" (1.664 m)   Wt 129 lb (58.5 kg)   SpO2 100% Comment: room air  BMI 21.14 kg/m       Physical Findings:  Wt Readings from Last 3 Encounters:  05/06/16 129 lb (58.5 kg)  05/02/16 130 lb 3.2 oz (59.1 kg)  04/29/16 131 lb (59.4 kg)    height is 5' 5.5" (1.664 m) and weight is 129 lb (58.5 kg). Her temperature is 98.3 F (36.8 C). Her oxygen saturation is 100%.  NAD, ambulatory, very tender to palpation in anterior level II, left neck. No enlarged  mass appreciated.   CBC    Component Value Date/Time   WBC 5.4 04/29/2016 1317   WBC 33.8 (H) 03/30/2016 1100   RBC 3.43 (L) 04/29/2016 1317   RBC 3.85 (L) 03/30/2016 1100   HGB 10.5 (L) 04/29/2016 1317   HCT 31.7 (L) 04/29/2016 1317   PLT 102 (L) 04/29/2016 1317   MCV 92.6 04/29/2016 1317   MCH 30.6 04/29/2016 1317   MCH 30.9 03/30/2016 1100   MCHC 33.1 04/29/2016 1317   MCHC 33.3 03/30/2016 1100   RDW 17.3 (H) 04/29/2016 1317   LYMPHSABS 0.5 (L) 04/29/2016 1317   MONOABS 0.4 04/29/2016 1317   EOSABS 0.0 04/29/2016 1317   BASOSABS 0.0 04/29/2016 1317     CMP     Component Value Date/Time   NA 142 04/29/2016 1317   K 3.8 04/29/2016 1317   CL 104 03/30/2016 1100   CO2 28 04/29/2016 1317   GLUCOSE 85 04/29/2016 1317   BUN 8.1 04/29/2016 1317   CREATININE 0.6 04/29/2016 1317   CALCIUM 9.7 04/29/2016 1317   PROT 6.3 (L) 04/29/2016 1317   ALBUMIN 3.5 04/29/2016 1317   AST 25 04/29/2016 1317   ALT 10 04/29/2016 1317   ALKPHOS 83 04/29/2016 1317   BILITOT 0.32  04/29/2016 1317   GFRNONAA >60 03/30/2016 1100   GFRAA >60 03/30/2016 1100     Impression:  The patient is tolerating radiotherapy   Plan:  Continue radiotherapy as planned.  Sucralfate PRN esophagitis.  F/u in 106mowith MRI to restage Brain. We will discuss PCI at that time.  I will ask Dr MEarlie Serverto restage her body before then.  IF he wants to move the PET scan back, we can move her appt with me back too.  Reassured that I don't feel a pathologically enlarged neck mass.  Her neck would be included in a PET scan at restaging, however.  SEppie Gibson MD

## 2016-05-06 NOTE — Telephone Encounter (Signed)
Spoke with pt states that she was prescribed metoprolol for when she takes her Neulasta injection she states that her HR goes up and down between 50-140 she states that she does not remember if she told Dr [name] at her last visit that her HR goes down too. And not just too high. She is wondering if the metoprolol will make HR go too low? She has not taken metoprolol due to this. Please advise

## 2016-05-06 NOTE — Telephone Encounter (Signed)
New Message  Pt c/o medication issue:  1. Name of Medication: metroprolol  2. How are you currently taking this medication (dosage and times per day)? '25mg'$    3. Are you having a reaction (difficulty breathing--STAT)? No   4. What is your medication issue? Per pt would like a call back to speak with RN about taking this medication with her chemo injection medication. Please call back to discuss

## 2016-05-07 ENCOUNTER — Other Ambulatory Visit: Payer: Self-pay | Admitting: Nurse Practitioner

## 2016-05-07 ENCOUNTER — Ambulatory Visit: Payer: Medicare Other

## 2016-05-07 ENCOUNTER — Ambulatory Visit (HOSPITAL_BASED_OUTPATIENT_CLINIC_OR_DEPARTMENT_OTHER): Payer: Medicare Other

## 2016-05-07 ENCOUNTER — Encounter: Payer: Self-pay | Admitting: Nurse Practitioner

## 2016-05-07 ENCOUNTER — Other Ambulatory Visit: Payer: Medicare Other

## 2016-05-07 VITALS — BP 100/49 | HR 79 | Temp 97.9°F | Resp 16

## 2016-05-07 DIAGNOSIS — C3490 Malignant neoplasm of unspecified part of unspecified bronchus or lung: Secondary | ICD-10-CM

## 2016-05-07 DIAGNOSIS — C3491 Malignant neoplasm of unspecified part of right bronchus or lung: Secondary | ICD-10-CM

## 2016-05-07 DIAGNOSIS — Z5111 Encounter for antineoplastic chemotherapy: Secondary | ICD-10-CM

## 2016-05-07 MED ORDER — POTASSIUM CHLORIDE 2 MEQ/ML IV SOLN
Freq: Once | INTRAVENOUS | Status: AC
  Administered 2016-05-07: 10:00:00 via INTRAVENOUS
  Filled 2016-05-07: qty 10

## 2016-05-07 MED ORDER — PALONOSETRON HCL INJECTION 0.25 MG/5ML
0.2500 mg | Freq: Once | INTRAVENOUS | Status: AC
  Administered 2016-05-07: 0.25 mg via INTRAVENOUS

## 2016-05-07 MED ORDER — ETOPOSIDE CHEMO INJECTION 1 GM/50ML
120.0000 mg/m2 | Freq: Once | INTRAVENOUS | Status: AC
  Administered 2016-05-07: 200 mg via INTRAVENOUS
  Filled 2016-05-07: qty 10

## 2016-05-07 MED ORDER — HEPARIN SOD (PORK) LOCK FLUSH 100 UNIT/ML IV SOLN
500.0000 [IU] | Freq: Once | INTRAVENOUS | Status: DC | PRN
  Filled 2016-05-07: qty 5

## 2016-05-07 MED ORDER — FAMOTIDINE 20 MG PO TABS
20.0000 mg | ORAL_TABLET | Freq: Once | ORAL | Status: AC
  Administered 2016-05-07: 20 mg via ORAL

## 2016-05-07 MED ORDER — SODIUM CHLORIDE 0.9 % IV SOLN
Freq: Once | INTRAVENOUS | Status: AC
  Administered 2016-05-07: 10:00:00 via INTRAVENOUS

## 2016-05-07 MED ORDER — HYDROCODONE-ACETAMINOPHEN 5-325 MG PO TABS: 1.0000 | ORAL_TABLET | Freq: Once | ORAL | Status: AC

## 2016-05-07 MED ORDER — SODIUM CHLORIDE 0.9% FLUSH
10.0000 mL | INTRAVENOUS | Status: DC | PRN
  Filled 2016-05-07: qty 10

## 2016-05-07 MED ORDER — ALUM & MAG HYDROXIDE-SIMETH 200-200-20 MG/5ML PO SUSP
30.0000 mL | Freq: Once | ORAL | Status: AC
  Administered 2016-05-07: 30 mL via ORAL
  Filled 2016-05-07: qty 30

## 2016-05-07 MED ORDER — SODIUM CHLORIDE 0.9 % IV SOLN
61.0000 mg/m2 | Freq: Once | INTRAVENOUS | Status: AC
  Administered 2016-05-07: 100 mg via INTRAVENOUS
  Filled 2016-05-07: qty 100

## 2016-05-07 MED ORDER — SODIUM CHLORIDE 0.9 % IV SOLN
Freq: Once | INTRAVENOUS | Status: AC
  Administered 2016-05-07: 12:00:00 via INTRAVENOUS
  Filled 2016-05-07: qty 5

## 2016-05-07 MED ORDER — PALONOSETRON HCL INJECTION 0.25 MG/5ML
INTRAVENOUS | Status: AC
  Filled 2016-05-07: qty 5

## 2016-05-07 NOTE — Progress Notes (Signed)
Spoke to patient's spoke to patient briefly while she was at the [place] today.  She states that her cardiologist Dr. [name] has prescribed her metoprolol to take when she experiences palpitations/rapid heart rate that patient is associating with the Neulasta injection after her chemotherapy.  Patient stated that she was very unclear regarding the metoprolol dose and specific instructions regarding the metoprolol.  Advised both patient and her husband that she should call Dr. Allison Quarry office to get further instructions clarified.

## 2016-05-07 NOTE — Patient Instructions (Signed)
[facility] Discharge Instructions for Patients Receiving Chemotherapy  Today you received the following chemotherapy agents cisplatin/etoposide.   To help prevent nausea and vomiting after your treatment, we encourage you to take your nausea medication as directed.    If you develop nausea and vomiting that is not controlled by your nausea medication, call the clinic.   BELOW ARE SYMPTOMS THAT SHOULD BE REPORTED IMMEDIATELY:  *FEVER GREATER THAN 100.5 F  *CHILLS WITH OR WITHOUT FEVER  NAUSEA AND VOMITING THAT IS NOT CONTROLLED WITH YOUR NAUSEA MEDICATION  *UNUSUAL SHORTNESS OF BREATH  *UNUSUAL BRUISING OR BLEEDING  TENDERNESS IN MOUTH AND THROAT WITH OR WITHOUT PRESENCE OF ULCERS  *URINARY PROBLEMS  *BOWEL PROBLEMS  UNUSUAL RASH Items with * indicate a potential emergency and should be followed up as soon as possible.  Feel free to call the clinic you have any questions or concerns. The clinic phone number is [phone].  

## 2016-05-07 NOTE — Progress Notes (Signed)
Nutrition Follow-up:   Patient seen in infusion. Has lung cancer.  Patient reports intake is fair.  Typically has oatmeal or egg beaters or peanut butter and toast for breakfast.  For lunch has banana or peanut butter sandwich or grilled cheese. Dinner varies but typically a meat and vegetables.    Drinks ensure plus but concerned about the sugar amount. Also drinks glucerna as well.  Reports some constipation at times but is controlled with taking medication.  No nausea or vomiting  Weight stable at 129 pounds on 12/4 and 11/13 visit. UBW of 145-150 pounds.  Patient did report fluid accumulation after one of her chemo treatments but has resolved.  Medications: reviewed  Labs: glucose 90 on 12/4    NUTRITION DIAGNOSIS: Unintentional weight loss stable at this time   INTERVENTION:   Discussed with patient ways to increase calories and protein.   Patient concerned about blood glucose and drinking ensure plus (increased carbs).  Discussed with patient that nutrition is important and that body needs carbohydrate during the day as well as increased calories from ensure plus. Patient has tried glucerna as well. Discussed difference in calories between the two shakes. Discussed other factors that could be causing an increase in blood sugar not just the foods that she eats.      MONITORING, EVALUATION, GOAL: Patient will tolerate adequate calories and protein to minimize loss of lean body mass.   NEXT VISIT: as needed  Norberto Wishon B. Zenia Resides, [place], [place] (pager)

## 2016-05-08 ENCOUNTER — Encounter: Payer: Self-pay | Admitting: Internal Medicine

## 2016-05-08 ENCOUNTER — Telehealth: Payer: Self-pay | Admitting: Cardiology

## 2016-05-08 ENCOUNTER — Ambulatory Visit (HOSPITAL_BASED_OUTPATIENT_CLINIC_OR_DEPARTMENT_OTHER): Payer: Medicare Other

## 2016-05-08 ENCOUNTER — Other Ambulatory Visit: Payer: Medicare Other

## 2016-05-08 VITALS — BP 155/80 | HR 94 | Temp 97.7°F | Resp 18

## 2016-05-08 DIAGNOSIS — C3490 Malignant neoplasm of unspecified part of unspecified bronchus or lung: Secondary | ICD-10-CM

## 2016-05-08 DIAGNOSIS — C3491 Malignant neoplasm of unspecified part of right bronchus or lung: Secondary | ICD-10-CM

## 2016-05-08 DIAGNOSIS — Z5111 Encounter for antineoplastic chemotherapy: Secondary | ICD-10-CM | POA: Diagnosis present

## 2016-05-08 MED ORDER — ACETAMINOPHEN 325 MG PO TABS
ORAL_TABLET | ORAL | Status: AC
  Filled 2016-05-08: qty 2

## 2016-05-08 MED ORDER — DEXAMETHASONE SODIUM PHOSPHATE 10 MG/ML IJ SOLN
5.0000 mg | Freq: Once | INTRAMUSCULAR | Status: AC
  Administered 2016-05-08: 5 mg via INTRAVENOUS

## 2016-05-08 MED ORDER — DEXAMETHASONE SODIUM PHOSPHATE 10 MG/ML IJ SOLN
INTRAMUSCULAR | Status: AC
  Filled 2016-05-08: qty 1

## 2016-05-08 MED ORDER — SODIUM CHLORIDE 0.9 % IV SOLN
Freq: Once | INTRAVENOUS | Status: AC
  Administered 2016-05-08: 13:00:00 via INTRAVENOUS

## 2016-05-08 MED ORDER — SODIUM CHLORIDE 0.9 % IV SOLN
120.0000 mg/m2 | Freq: Once | INTRAVENOUS | Status: AC
  Administered 2016-05-08: 200 mg via INTRAVENOUS
  Filled 2016-05-08: qty 10

## 2016-05-08 MED ORDER — ACETAMINOPHEN 325 MG PO TABS
650.0000 mg | ORAL_TABLET | Freq: Once | ORAL | Status: AC
  Administered 2016-05-08: 650 mg via ORAL

## 2016-05-08 NOTE — Telephone Encounter (Signed)
See other note. Sharyn Lull discussed med changes w patient. I cannot find any other call documentation, recall, upcoming appt info, etc.

## 2016-05-08 NOTE — Telephone Encounter (Signed)
New Message  Pt call stating she received a call from Dr. [name] office. Please call back to discuss if needed.

## 2016-05-08 NOTE — Telephone Encounter (Signed)
PATIENT SPOKE TO TRIAGE NURSE. QUESTION ANSWERED.

## 2016-05-08 NOTE — Telephone Encounter (Signed)
Per last visit AVS Patient Instructions  START DAY OF INJECTION CHEMO.  METOPROLOL TARTRATE 25 MG  IN THE MORNING AND NIGHT FOR 3 DAYS , THEN ON THE THIRD DAY TAKE IN THE MORNING.  OTHERWISE TAKE 1 TABLET OF METOPROLOL TARTRATE IF YOU HAVE A SPELL OF FAST HEARTBEAT.  Pt notified of Dr Allison Quarry note, and directions from AVS above. Pt still states that even though Oncologist and Dr [name] both say this is not related to the Neulasta she still thinks that she remembers that the pharmacist  telling her that it is. I explained the medication dosage schedule above, she still is thinking that Metoprolol will decrease her HR. I assured her that Dr [name] is confident that this is not the case and to try medication schedule any way and see if this helps. She states that she will try and see if it helps.

## 2016-05-09 ENCOUNTER — Telehealth: Payer: Self-pay | Admitting: *Deleted

## 2016-05-09 NOTE — Telephone Encounter (Signed)
"  I was in yesterday for chemotherapy and I noticed today the hand on the left side where IV was is swollen.  Why is the hand swollen and the IV was in the middle of my arm.  Do I have an infection.?  My ankles are also swollen.  I take Plavix."    Denies redness, warmth or pain to any extremities.  No red or blue streaks on arm.  Equal swelling bilateraly.  Advised to drink fluids well, elevate lower and upper extremities whenever possible.  Asked what nurse can do tomorrow.  Treatment nurse assigned to her will evaluate both arms for IV start.  Tell nurse of left arm and hand concerns.

## 2016-05-10 ENCOUNTER — Ambulatory Visit (HOSPITAL_BASED_OUTPATIENT_CLINIC_OR_DEPARTMENT_OTHER): Payer: Medicare Other

## 2016-05-10 ENCOUNTER — Other Ambulatory Visit: Payer: Self-pay | Admitting: Radiation Therapy

## 2016-05-10 ENCOUNTER — Telehealth: Payer: Self-pay | Admitting: Cardiology

## 2016-05-10 ENCOUNTER — Other Ambulatory Visit: Payer: Medicare Other

## 2016-05-10 VITALS — BP 130/75 | HR 70 | Temp 97.7°F | Resp 18

## 2016-05-10 DIAGNOSIS — C3491 Malignant neoplasm of unspecified part of right bronchus or lung: Secondary | ICD-10-CM

## 2016-05-10 DIAGNOSIS — C3490 Malignant neoplasm of unspecified part of unspecified bronchus or lung: Secondary | ICD-10-CM

## 2016-05-10 DIAGNOSIS — Z5111 Encounter for antineoplastic chemotherapy: Secondary | ICD-10-CM

## 2016-05-10 MED ORDER — SODIUM CHLORIDE 0.9 % IV SOLN
120.0000 mg/m2 | Freq: Once | INTRAVENOUS | Status: AC
  Administered 2016-05-10: 200 mg via INTRAVENOUS
  Filled 2016-05-10: qty 10

## 2016-05-10 MED ORDER — DEXAMETHASONE SODIUM PHOSPHATE 10 MG/ML IJ SOLN
5.0000 mg | Freq: Once | INTRAMUSCULAR | Status: AC
  Administered 2016-05-10: 5 mg via INTRAVENOUS

## 2016-05-10 MED ORDER — SODIUM CHLORIDE 0.9 % IV SOLN
Freq: Once | INTRAVENOUS | Status: AC
  Administered 2016-05-10: 09:00:00 via INTRAVENOUS

## 2016-05-10 MED ORDER — DEXAMETHASONE SODIUM PHOSPHATE 10 MG/ML IJ SOLN
INTRAMUSCULAR | Status: AC
  Filled 2016-05-10: qty 1

## 2016-05-10 NOTE — Telephone Encounter (Signed)
Spoke to patient. Explained that typically chemo can raise BP, HR. She has already received instruction on taking metoprolol PRN in context of chemotherapy as discussed by Dr. [name] and Dr. Julien Nordmann. She voices understanding of these instructions. She will continue to monitor VS readings and take meds as recommended. She voiced confirmation of her follow up appt w Almyra Deforest on 12/13 and will address any further concerns at that time. She voiced thanks for call.

## 2016-05-10 NOTE — Telephone Encounter (Signed)
New message      Pt states her bp is higher today after chemo---154/81 HR 100. Please advise

## 2016-05-10 NOTE — Patient Instructions (Signed)
[facility] Discharge Instructions for Patients Receiving Chemotherapy  Today you received the following chemotherapy agents: Etoposide   To help prevent nausea and vomiting after your treatment, we encourage you to take your nausea medication as directed.    If you develop nausea and vomiting that is not controlled by your nausea medication, call the clinic.   BELOW ARE SYMPTOMS THAT SHOULD BE REPORTED IMMEDIATELY:  *FEVER GREATER THAN 100.5 F  *CHILLS WITH OR WITHOUT FEVER  NAUSEA AND VOMITING THAT IS NOT CONTROLLED WITH YOUR NAUSEA MEDICATION  *UNUSUAL SHORTNESS OF BREATH  *UNUSUAL BRUISING OR BLEEDING  TENDERNESS IN MOUTH AND THROAT WITH OR WITHOUT PRESENCE OF ULCERS  *URINARY PROBLEMS  *BOWEL PROBLEMS  UNUSUAL RASH Items with * indicate a potential emergency and should be followed up as soon as possible.  Feel free to call the clinic you have any questions or concerns. The clinic phone number is [phone].  Please show the CHEMO ALERT CARD at check-in to the Emergency Department and triage nurse.   

## 2016-05-11 ENCOUNTER — Ambulatory Visit (HOSPITAL_BASED_OUTPATIENT_CLINIC_OR_DEPARTMENT_OTHER): Payer: Medicare Other

## 2016-05-11 VITALS — BP 135/83 | HR 73 | Temp 98.2°F | Resp 18

## 2016-05-11 DIAGNOSIS — C3490 Malignant neoplasm of unspecified part of unspecified bronchus or lung: Secondary | ICD-10-CM

## 2016-05-11 DIAGNOSIS — C3491 Malignant neoplasm of unspecified part of right bronchus or lung: Secondary | ICD-10-CM

## 2016-05-11 MED ORDER — PEGFILGRASTIM INJECTION 6 MG/0.6ML ~~LOC~~
6.0000 mg | PREFILLED_SYRINGE | Freq: Once | SUBCUTANEOUS | Status: AC
  Administered 2016-05-11: 6 mg via SUBCUTANEOUS

## 2016-05-11 NOTE — Patient Instructions (Signed)
Pegfilgrastim injection What is this medicine? PEGFILGRASTIM (PEG fil gra stim) is a long-acting granulocyte colony-stimulating factor that stimulates the growth of neutrophils, a type of white blood cell important in the body's fight against infection. It is used to reduce the incidence of fever and infection in patients with certain types of cancer who are receiving chemotherapy that affects the bone marrow, and to increase survival after being exposed to high doses of radiation. This medicine may be used for other purposes; ask your health care provider or pharmacist if you have questions. COMMON BRAND NAME(S): Neulasta What should I tell my health care provider before I take this medicine? They need to know if you have any of these conditions: -kidney disease -latex allergy -ongoing radiation therapy -sickle cell disease -skin reactions to acrylic adhesives (On-Body Injector only) -an unusual or allergic reaction to pegfilgrastim, filgrastim, other medicines, foods, dyes, or preservatives -pregnant or trying to get pregnant -breast-feeding How should I use this medicine? This medicine is for injection under the skin. If you get this medicine at home, you will be taught how to prepare and give the pre-filled syringe or how to use the On-body Injector. Refer to the patient Instructions for Use for detailed instructions. Use exactly as directed. Take your medicine at regular intervals. Do not take your medicine more often than directed. It is important that you put your used needles and syringes in a special sharps container. Do not put them in a trash can. If you do not have a sharps container, call your pharmacist or healthcare provider to get one. Talk to your pediatrician regarding the use of this medicine in children. While this drug may be prescribed for selected conditions, precautions do apply. Overdosage: If you think you have taken too much of this medicine contact a poison control  center or emergency room at once. NOTE: This medicine is only for you. Do not share this medicine with others. What if I miss a dose? It is important not to miss your dose. Call your doctor or health care professional if you miss your dose. If you miss a dose due to an On-body Injector failure or leakage, a new dose should be administered as soon as possible using a single prefilled syringe for manual use. What may interact with this medicine? Interactions have not been studied. Give your health care provider a list of all the medicines, herbs, non-prescription drugs, or dietary supplements you use. Also tell them if you smoke, drink alcohol, or use illegal drugs. Some items may interact with your medicine. This list may not describe all possible interactions. Give your health care provider a list of all the medicines, herbs, non-prescription drugs, or dietary supplements you use. Also tell them if you smoke, drink alcohol, or use illegal drugs. Some items may interact with your medicine. What should I watch for while using this medicine? You may need blood work done while you are taking this medicine. If you are going to need a MRI, CT scan, or other procedure, tell your doctor that you are using this medicine (On-Body Injector only). What side effects may I notice from receiving this medicine? Side effects that you should report to your doctor or health care professional as soon as possible: -allergic reactions like skin rash, itching or hives, swelling of the face, lips, or tongue -dizziness -fever -pain, redness, or irritation at site where injected -pinpoint red spots on the skin -red or dark-brown urine -shortness of breath or breathing problems -stomach or   side pain, or pain at the shoulder -swelling -tiredness -trouble passing urine or change in the amount of urine Side effects that usually do not require medical attention (report to your doctor or health care professional if they  continue or are bothersome): -bone pain -muscle pain This list may not describe all possible side effects. Call your doctor for medical advice about side effects. You may report side effects to FDA at 1-800-FDA-1088. Where should I keep my medicine? Keep out of the reach of children. Store pre-filled syringes in a refrigerator between 2 and 8 degrees C (36 and 46 degrees F). Do not freeze. Keep in carton to protect from light. Throw away this medicine if it is left out of the refrigerator for more than 48 hours. Throw away any unused medicine after the expiration date. NOTE: This sheet is a summary. It may not cover all possible information. If you have questions about this medicine, talk to your doctor, pharmacist, or health care provider.  2017 [employer]vier/Gold Standard (2014-06-09 14:30:14)  

## 2016-05-13 ENCOUNTER — Other Ambulatory Visit (HOSPITAL_BASED_OUTPATIENT_CLINIC_OR_DEPARTMENT_OTHER): Payer: Medicare Other

## 2016-05-13 DIAGNOSIS — C3491 Malignant neoplasm of unspecified part of right bronchus or lung: Secondary | ICD-10-CM

## 2016-05-13 DIAGNOSIS — C3490 Malignant neoplasm of unspecified part of unspecified bronchus or lung: Secondary | ICD-10-CM | POA: Diagnosis present

## 2016-05-13 LAB — CBC WITH DIFFERENTIAL/PLATELET
BASO%: 0.2 % (ref 0.0–2.0)
Basophils Absolute: 0 10*3/uL (ref 0.0–0.1)
EOS%: 0 % (ref 0.0–7.0)
Eosinophils Absolute: 0 10*3/uL (ref 0.0–0.5)
HCT: 32.2 % — ABNORMAL LOW (ref 34.8–46.6)
HGB: 10.6 g/dL — ABNORMAL LOW (ref 11.6–15.9)
LYMPH%: 2.4 % — ABNORMAL LOW (ref 14.0–49.7)
MCH: 30.9 pg (ref 25.1–34.0)
MCHC: 32.9 g/dL (ref 31.5–36.0)
MCV: 93.9 fL (ref 79.5–101.0)
MONO#: 0.1 10*3/uL (ref 0.1–0.9)
MONO%: 0.7 % (ref 0.0–14.0)
NEUT#: 17.1 10*3/uL — ABNORMAL HIGH (ref 1.5–6.5)
NEUT%: 96.7 % — ABNORMAL HIGH (ref 38.4–76.8)
Platelets: 154 10*3/uL (ref 145–400)
RBC: 3.43 10*6/uL — ABNORMAL LOW (ref 3.70–5.45)
RDW: 16.7 % — ABNORMAL HIGH (ref 11.2–14.5)
WBC: 17.7 10*3/uL — ABNORMAL HIGH (ref 3.9–10.3)
lymph#: 0.4 10*3/uL — ABNORMAL LOW (ref 0.9–3.3)
nRBC: 0 % (ref 0–0)

## 2016-05-13 LAB — COMPREHENSIVE METABOLIC PANEL
ALT: 11 U/L (ref 0–55)
AST: 20 U/L (ref 5–34)
Albumin: 3.4 g/dL — ABNORMAL LOW (ref 3.5–5.0)
Alkaline Phosphatase: 80 U/L (ref 40–150)
Anion Gap: 9 mEq/L (ref 3–11)
BUN: 18.4 mg/dL (ref 7.0–26.0)
CO2: 27 mEq/L (ref 22–29)
Calcium: 9.5 mg/dL (ref 8.4–10.4)
Chloride: 106 mEq/L (ref 98–109)
Creatinine: 0.7 mg/dL (ref 0.6–1.1)
EGFR: >90 mL/min/{1.73_m2} (ref >90)
Glucose: 163 mg/dl — ABNORMAL HIGH (ref 70–140)
Potassium: 4.1 mEq/L (ref 3.5–5.1)
Sodium: 142 mEq/L (ref 136–145)
Total Bilirubin: 0.53 mg/dL (ref 0.20–1.20)
Total Protein: 6 g/dL — ABNORMAL LOW (ref 6.4–8.3)

## 2016-05-13 LAB — MAGNESIUM: Magnesium: 1.9 mg/dl (ref 1.5–2.5)

## 2016-05-14 ENCOUNTER — Encounter: Payer: Self-pay | Admitting: *Deleted

## 2016-05-14 ENCOUNTER — Other Ambulatory Visit: Payer: Self-pay | Admitting: Nurse Practitioner

## 2016-05-14 ENCOUNTER — Telehealth: Payer: Self-pay | Admitting: Nurse Practitioner

## 2016-05-14 DIAGNOSIS — C3491 Malignant neoplasm of unspecified part of right bronchus or lung: Secondary | ICD-10-CM

## 2016-05-14 NOTE — Progress Notes (Signed)
Oncology Nurse Navigator Documentation  Oncology Nurse Navigator Flowsheets 05/14/2016  Navigator Location [facility]  Navigator Encounter Type Lobby/Ms Bearse arrived a cancer center with various complaints.  Dr. Julien Nordmann and Jenny Reichmann are updated and discussed next steps.  She will get CT chest and neck and next re-staging exam.  According to Dr. Julien Nordmann, lab work is ok.  He states patient should hydrate herself and rest.  If she is still light headed, she needs to go to ED.  I updated Ms. Mckinlay.  She looks really good today.  She has color and walking steady in cancer center.  She does not want to go to ED due to "people are sick there".  Patient has chemo card and can use that to be isolated from general public.  She again is not interested in going to the ED. She has an appt with cardiology tomorrow.   Treatment Phase Treatment  Barriers/Navigation Needs Education  Education Other  Interventions Education  Education Method Verbal  Acuity Level 2  Acuity Level 2 Educational needs;Other  Time Spent with Patient 45

## 2016-05-14 NOTE — Progress Notes (Signed)
Oncology Nurse Navigator Documentation  Oncology Nurse Navigator Flowsheets 05/14/2016  Navigator Location [facility]  Navigator Encounter Type Other/Do to Ms. Attwood multiple complaints and multiple times she has been seen, I contacted CSW and Chaplain for assistance.  She may have a lot of anxiety relating to her cancer and need to have counseling about it.    Treatment Phase Treatment  Barriers/Navigation Needs Coordination of Care  Interventions Coordination of Care  Coordination of Care Other  Acuity Level 1  Acuity Level 1 Minimal follow up required  Time Spent with Patient 15

## 2016-05-14 NOTE — Telephone Encounter (Signed)
This provider canceled the CT with contrast of the chest restaging scan that was scheduled to be obtained at [facility]; and re-ordered both a CT with contrast of the chest and a CT with contrast of the soft tissues of the neck for restaging purposes at [facility] instead.  Patient states that she has to have her scans at [facility] because she has stents in her heart.  Also confirm that patient's final radiation treatment was 05/06/2016.  She is scheduled to see her cardiologist office tomorrow 05/15/2016.

## 2016-05-15 ENCOUNTER — Telehealth: Payer: Self-pay | Admitting: Physician Assistant

## 2016-05-15 ENCOUNTER — Ambulatory Visit (INDEPENDENT_AMBULATORY_CARE_PROVIDER_SITE_OTHER): Payer: Medicare Other | Admitting: Physician Assistant

## 2016-05-15 ENCOUNTER — Encounter: Payer: Self-pay | Admitting: Physician Assistant

## 2016-05-15 ENCOUNTER — Ambulatory Visit
Admission: RE | Admit: 2016-05-15 | Discharge: 2016-05-15 | Disposition: A | Discharge status: Home / Self Care | Payer: Medicare Other | Source: Ambulatory Visit | Attending: Physician Assistant | Admitting: Physician Assistant

## 2016-05-15 VITALS — BP 84/54 | HR 96 | Ht 65.5 in | Wt 128.2 lb

## 2016-05-15 DIAGNOSIS — R002 Palpitations: Secondary | ICD-10-CM | POA: Diagnosis not present

## 2016-05-15 DIAGNOSIS — R42 Dizziness and giddiness: Secondary | ICD-10-CM

## 2016-05-15 DIAGNOSIS — I208 Other forms of angina pectoris: Secondary | ICD-10-CM

## 2016-05-15 DIAGNOSIS — C801 Malignant (primary) neoplasm, unspecified: Secondary | ICD-10-CM

## 2016-05-15 DIAGNOSIS — R05 Cough: Secondary | ICD-10-CM

## 2016-05-15 DIAGNOSIS — D72829 Elevated white blood cell count, unspecified: Secondary | ICD-10-CM | POA: Diagnosis not present

## 2016-05-15 DIAGNOSIS — I10 Essential (primary) hypertension: Secondary | ICD-10-CM

## 2016-05-15 DIAGNOSIS — J449 Chronic obstructive pulmonary disease, unspecified: Secondary | ICD-10-CM

## 2016-05-15 DIAGNOSIS — R058 Other specified cough: Secondary | ICD-10-CM

## 2016-05-15 DIAGNOSIS — I2510 Atherosclerotic heart disease of native coronary artery without angina pectoris: Secondary | ICD-10-CM

## 2016-05-15 NOTE — Patient Instructions (Addendum)
Medication Instructions:  HOLD Metoprolol HOLD Coreg (Carvedilol)--check blood pressure every morning for the next 5 days If your top number is greater than 110 take 1 tablet by mouth IF the top number is less than 110 CONTINUE to HOLD Coreg  Labwork: Your physician recommends that you return for lab work in: TODAY complete a urinalysis  Testing/Procedures: A chest x-ray takes a picture of the organs and structures inside the chest, including the heart, lungs, and blood vessels. This test can show several things, including, whether the heart is enlarges; whether fluid is building up in the lungs; and whether pacemaker / defibrillator leads are still in place.  Follow-Up: Your physician recommends that you schedule a follow-up appointment in: [place] (Monday 05/20/2016)  Any Other Special Instructions Will Be Listed Below (If Applicable).  If you need a refill on your cardiac medications before your next appointment, please call your pharmacy.

## 2016-05-15 NOTE — Telephone Encounter (Signed)
New Message  Pt c/o BP issue: STAT if pt c/o blurred vision, one-sided weakness or slurred speech  1. What are your last 5 BP readings? 138/76  2. Are you having any other symptoms (ex. Dizziness, headache, blurred vision, passed out)? No   3. What is your BP issue? Per pt states once leaving ov today, pt states she checked bp once returning home and it was 138/76. Pt would like to speak with RN. Please call back to discuss
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
## 2016-05-15 NOTE — Telephone Encounter (Signed)
I have personally called the patient and discussed the plan, plan is unchanged with resuming her coreg tomorrow AM if SBP > 110, hesitant to restart too early given SBP in 80s during office visit. Also discussed her CXR result with her, no acute etiology seen. Will continue observe symptom with OTC medication (avoid pseudoephedrine given her tachypalpitation). Oncology ordered a chest CT for her next Monday which will show more info.

## 2016-05-16 ENCOUNTER — Other Ambulatory Visit: Payer: Self-pay | Admitting: Nurse Practitioner

## 2016-05-16 ENCOUNTER — Encounter: Payer: Self-pay | Admitting: Nurse Practitioner

## 2016-05-16 ENCOUNTER — Telehealth: Payer: Self-pay | Admitting: Nurse Practitioner

## 2016-05-16 LAB — URINALYSIS
Bilirubin Urine: NEGATIVE
Hgb urine dipstick: NEGATIVE
Ketones, ur: NEGATIVE
Leukocytes, UA: NEGATIVE
Nitrite: NEGATIVE
Protein, ur: NEGATIVE
Specific Gravity, Urine: 1.02 (ref 1.001–1.035)
pH: 5.5 (ref 5.0–8.0)

## 2016-05-16 MED ORDER — BENZONATATE 100 MG PO CAPS: 200.0000 mg | ORAL_CAPSULE | Freq: Three times a day (TID) | ORAL | 0 refills | Status: DC | PRN

## 2016-05-16 NOTE — Telephone Encounter (Signed)
   Pt called earlier due to persistent cough.  She was eval in the office earlier this week and advised to try OTC therapy.  This has not worked.  I initially sent in a Rx for tessalon 200 mg TID prn, # 60 w/ no refills, but she called back and said she has been having trouble swallowing b/c her throat hurts r/t to her cough.  I then called her pharmacy and Rx guaifenesin/codeine 100-10/30m, 10 ml q6h prn cough, 120 ml, no refills.  I called pt to let her know.  Pt verbalized understanding and was grateful for the call back.  CMurray Hodgkins NP 05/16/2016, 7:18 PM

## 2016-05-16 NOTE — Progress Notes (Signed)
   Pt called stating that as advised, she tried OTC cough medicine without improvement.  She would like a Rx for something stronger.  I have sent in a Rx for tessalon perles 200 mg tid prn, #60, zero refills.  I rec that she f/u with primary care for persistent cough/congestion.  Caller verbalized understanding and was grateful for the call back.  Murray Hodgkins, NP 05/16/2016, 6:46 PM

## 2016-05-17 ENCOUNTER — Other Ambulatory Visit: Payer: Self-pay | Admitting: Cardiology

## 2016-05-17 DIAGNOSIS — E785 Hyperlipidemia, unspecified: Secondary | ICD-10-CM

## 2016-05-17 DIAGNOSIS — Z79899 Other long term (current) drug therapy: Secondary | ICD-10-CM

## 2016-05-17 NOTE — Progress Notes (Signed)
  Radiation Oncology         [phone] ________________________________  Name: Michele [name] MRN: [id number]  Date: 05/06/2016  DOB: January 24, 1956  End of Treatment Note  Diagnosis:   Limited stage IIIA (T0, N2, M0) small cell lung cancer       ICD-9-CM ICD-10-CM   1. Secondary malignancy of mediastinal lymph nodes (HCC) 196.1 C77.1     Indication for treatment:  Curative w/ Concurrent chemotherapy   Radiation treatment dates:   03/19/2016 to 05/06/2016  Site/dose:   The mediastinum was treated to 66 Gy in 33 fractions at 2 Gy per fraction.   Beams/energy:   3D // 10X, 6X  Narrative: The patient tolerated radiation treatment relatively well.  Plan: The patient has completed radiation treatment. The patient will return to radiation oncology clinic for routine followup in one month to discuss PCI and evaluate her recovery.   -----------------------------------  [name], MD   This document serves as a record of services personally performed by [name], MD. It was created on her behalf by Arlyce Harman, a trained medical scribe. The creation of this record is based on the scribe's personal observations and the provider's statements to them. This document has been checked and approved by the attending provider.

## 2016-05-20 ENCOUNTER — Other Ambulatory Visit (HOSPITAL_BASED_OUTPATIENT_CLINIC_OR_DEPARTMENT_OTHER): Payer: Medicare Other

## 2016-05-20 ENCOUNTER — Encounter (HOSPITAL_COMMUNITY): Payer: Self-pay

## 2016-05-20 ENCOUNTER — Ambulatory Visit (HOSPITAL_COMMUNITY)
Admission: RE | Admit: 2016-05-20 | Discharge: 2016-05-20 | Disposition: A | Discharge status: Home / Self Care | Payer: Medicare Other | Source: Ambulatory Visit | Attending: Nurse Practitioner | Admitting: Nurse Practitioner

## 2016-05-20 ENCOUNTER — Ambulatory Visit (HOSPITAL_BASED_OUTPATIENT_CLINIC_OR_DEPARTMENT_OTHER): Payer: Medicare Other | Admitting: Nurse Practitioner

## 2016-05-20 VITALS — BP 148/98 | HR 70 | Temp 98.5°F | Resp 16 | Ht 65.5 in | Wt 129.3 lb

## 2016-05-20 DIAGNOSIS — I700 Atherosclerosis of aorta: Secondary | ICD-10-CM | POA: Diagnosis not present

## 2016-05-20 DIAGNOSIS — C3491 Malignant neoplasm of unspecified part of right bronchus or lung: Secondary | ICD-10-CM

## 2016-05-20 DIAGNOSIS — I82611 Acute embolism and thrombosis of superficial veins of right upper extremity: Secondary | ICD-10-CM | POA: Diagnosis not present

## 2016-05-20 DIAGNOSIS — C3411 Malignant neoplasm of upper lobe, right bronchus or lung: Secondary | ICD-10-CM | POA: Diagnosis not present

## 2016-05-20 DIAGNOSIS — I2510 Atherosclerotic heart disease of native coronary artery without angina pectoris: Secondary | ICD-10-CM | POA: Diagnosis not present

## 2016-05-20 DIAGNOSIS — C3490 Malignant neoplasm of unspecified part of unspecified bronchus or lung: Secondary | ICD-10-CM

## 2016-05-20 LAB — COMPREHENSIVE METABOLIC PANEL
ALT: 10 U/L (ref 0–55)
AST: 26 U/L (ref 5–34)
Albumin: 3.6 g/dL (ref 3.5–5.0)
Alkaline Phosphatase: 91 U/L (ref 40–150)
Anion Gap: 10 mEq/L (ref 3–11)
BUN: 6.1 mg/dL — ABNORMAL LOW (ref 7.0–26.0)
CO2: 25 mEq/L (ref 22–29)
Calcium: 9.7 mg/dL (ref 8.4–10.4)
Chloride: 109 mEq/L (ref 98–109)
Creatinine: 0.6 mg/dL (ref 0.6–1.1)
EGFR: >90 mL/min/{1.73_m2} (ref >90)
Glucose: 99 mg/dl (ref 70–140)
Potassium: 3.9 mEq/L (ref 3.5–5.1)
Sodium: 144 mEq/L (ref 136–145)
Total Bilirubin: 0.26 mg/dL (ref 0.20–1.20)
Total Protein: 6.5 g/dL (ref 6.4–8.3)

## 2016-05-20 LAB — CBC WITH DIFFERENTIAL/PLATELET
BASO%: 0.2 % (ref 0.0–2.0)
Basophils Absolute: 0 10*3/uL (ref 0.0–0.1)
EOS%: 0.3 % (ref 0.0–7.0)
Eosinophils Absolute: 0 10*3/uL (ref 0.0–0.5)
HCT: 31.9 % — ABNORMAL LOW (ref 34.8–46.6)
HGB: 10.3 g/dL — ABNORMAL LOW (ref 11.6–15.9)
LYMPH%: 5.6 % — ABNORMAL LOW (ref 14.0–49.7)
MCH: 31.1 pg (ref 25.1–34.0)
MCHC: 32.3 g/dL (ref 31.5–36.0)
MCV: 96.4 fL (ref 79.5–101.0)
MONO#: 0.6 10*3/uL (ref 0.1–0.9)
MONO%: 6.8 % (ref 0.0–14.0)
NEUT#: 8.1 10*3/uL — ABNORMAL HIGH (ref 1.5–6.5)
NEUT%: 87.1 % — ABNORMAL HIGH (ref 38.4–76.8)
Platelets: 99 10*3/uL — ABNORMAL LOW (ref 145–400)
RBC: 3.31 10*6/uL — ABNORMAL LOW (ref 3.70–5.45)
RDW: 17.6 % — ABNORMAL HIGH (ref 11.2–14.5)
WBC: 9.3 10*3/uL (ref 3.9–10.3)
lymph#: 0.5 10*3/uL — ABNORMAL LOW (ref 0.9–3.3)

## 2016-05-20 LAB — MAGNESIUM: Magnesium: 2.2 mg/dl (ref 1.5–2.5)

## 2016-05-20 MED ORDER — IOPAMIDOL (ISOVUE-300) INJECTION 61%
INTRAVENOUS | Status: AC
  Filled 2016-05-20: qty 100

## 2016-05-20 MED ORDER — IOPAMIDOL (ISOVUE-300) INJECTION 61%
100.0000 mL | Freq: Once | INTRAVENOUS | Status: DC | PRN
  Administered 2016-05-20: 100 mL via INTRAVENOUS
  Filled 2016-05-20: qty 100

## 2016-05-21 ENCOUNTER — Encounter: Payer: Self-pay | Admitting: General Practice

## 2016-05-21 ENCOUNTER — Telehealth: Payer: Self-pay

## 2016-05-21 NOTE — Telephone Encounter (Signed)
Pt saw [facility] on 12/18. She is clarifying appts that are necessary. Does she need to keep lab on 26th? Will she Dr Julien Nordmann on the 26th? She would prefer to see Dr Julien Nordmann.  Will he start a new therapy?

## 2016-05-21 NOTE — Progress Notes (Signed)
[place] Spiritual Care Note  Referred by Thoracic Navigator Dana Herndon/RN for support.  Michele [name] by phone, helping her explore her emotional/spiritual resources and challenges.  Michele [name] welcomed call and verbalized interest in [name].  Per pt, she continues to feel physically weak since treatment.  She states that she finds pleasure and meaning in getting out of the house and connecting with friends/family, but worries about germs/compromised immune system.  She also notes that she has a history of feeling more down in the winter (colder, darker) months.  Per pt, she has support from her boyfriend (lives with her), her two daughters (local, but with some limitations due to childcare and lack of transportation), and her stepsister.  Michele [name] also notes that her brother, 47 and "mentally challenged," lives with her and needs assistance with tasks such as paying bills.  Michele [name] presents as more socially isolated and disconnected than she would like due to the above concerns.  We talked about whether these factors might be reinforcing one another.  Consulted with Hinton Dyer Herndon/RN, who plans to phone pt to address immune-system concerns and precautions.  Talked with [name] at [employer] about [employer]; plan to mail her a full packet print materials with a handwritten letter of encouragement.  Murphy and I plan for me to f/u by phone in the new year, potentially scheduling a 1:1 appt at that time.     [name], [place], [facility] Pager [phone] Voicemail [phone]

## 2016-05-21 NOTE — Telephone Encounter (Signed)
S/w cyndee bacon and Lisa. inbasket sent to move lab and Ned Card on 12/26 to Dr Julien Nordmann with lab. S/w pt to expect call from schedulers.

## 2016-05-22 ENCOUNTER — Other Ambulatory Visit: Payer: Self-pay | Admitting: Physician Assistant

## 2016-05-22 ENCOUNTER — Encounter: Payer: Self-pay | Admitting: General Practice

## 2016-05-22 ENCOUNTER — Encounter: Payer: Self-pay | Admitting: Nurse Practitioner

## 2016-05-22 ENCOUNTER — Telehealth: Payer: Self-pay | Admitting: *Deleted

## 2016-05-22 ENCOUNTER — Ambulatory Visit (INDEPENDENT_AMBULATORY_CARE_PROVIDER_SITE_OTHER): Payer: Medicare Other | Admitting: Physician Assistant

## 2016-05-22 ENCOUNTER — Encounter: Payer: Self-pay | Admitting: Physician Assistant

## 2016-05-22 VITALS — BP 118/74 | HR 84 | Ht 65.5 in | Wt 130.4 lb

## 2016-05-22 DIAGNOSIS — J449 Chronic obstructive pulmonary disease, unspecified: Secondary | ICD-10-CM

## 2016-05-22 DIAGNOSIS — I2510 Atherosclerotic heart disease of native coronary artery without angina pectoris: Secondary | ICD-10-CM

## 2016-05-22 DIAGNOSIS — I10 Essential (primary) hypertension: Secondary | ICD-10-CM

## 2016-05-22 DIAGNOSIS — R05 Cough: Secondary | ICD-10-CM | POA: Diagnosis not present

## 2016-05-22 DIAGNOSIS — C801 Malignant (primary) neoplasm, unspecified: Secondary | ICD-10-CM | POA: Diagnosis not present

## 2016-05-22 DIAGNOSIS — R059 Cough, unspecified: Secondary | ICD-10-CM

## 2016-05-22 DIAGNOSIS — I208 Other forms of angina pectoris: Secondary | ICD-10-CM

## 2016-05-22 DIAGNOSIS — I82611 Acute embolism and thrombosis of superficial veins of right upper extremity: Secondary | ICD-10-CM | POA: Insufficient documentation

## 2016-05-22 DIAGNOSIS — R002 Palpitations: Secondary | ICD-10-CM

## 2016-05-22 MED ORDER — GUAIFENESIN-CODEINE 100-10 MG/5ML PO SYRP: 5.0000 mL | ORAL_SOLUTION | Freq: Three times a day (TID) | ORAL | 0 refills | Status: DC | PRN

## 2016-05-22 NOTE — Assessment & Plan Note (Signed)
Patient has been diagnosed within the past few months via doppler US with right upper extremity superficial thrombosis at the site of a past peripheral IV stick.  She states that the back of her right hand continues to become edematous at night and sometimes feels like it is cramping as well.  She states that the edema has essentially resolved by morning.  She denies any new pain, erythema, warmth, or red streaks.  Exam today reveals right upper extremity and hand with no edema, no warmth, no erythema, and no obvious trauma or injury.  Patient was advised to continue elevating her arm/hand whenever she is at rest.  She may also use cool or warm compresses to the site as well.
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
## 2016-05-22 NOTE — Patient Instructions (Signed)
Medication Instructions:  NO CHANGES   If you need a refill on your cardiac medications before your next appointment, please call your pharmacy.  Labwork: NONE  Testing/Procedures: NONE  Follow-Up: Your physician recommends that you schedule a follow-up appointment in: 3 MONTHS WITH DR Mclaughlin [facility]   Any Other Special Instructions Will Be Listed Below (If Applicable).    Thank you for choosing [facility]!!

## 2016-05-22 NOTE — Assessment & Plan Note (Signed)
Patient has completed all of her chemotherapy in her radiation treatments.  She underwent a restaging scan earlier today and is in to review results.  CT scan obtained today revealed stable disease.  Reviewed CT scans extensively with both patient and her family member.  Patient stated that she was upset and was tearful; stating that "she was hoping that she would be cured of all of her cancer".  Patient was also questioning any future care plan.  Patient will return to the [facility] on 05/28/2016 for labs and a follow-up visit to further discuss future care.  This provider will review  all scan results with Dr. Julien Nordmann as well; and Dr. Julien Nordmann.  We'll plan to see the patient when she returns to the clinic on 05/28/2016.

## 2016-05-22 NOTE — Progress Notes (Signed)
[place] Spiritual Care Note  Mailed full packet of [place] team/programming information with handwritten note of encouragement.   [place], [place], [facility] Pager [phone] Voicemail [phone]

## 2016-05-22 NOTE — Telephone Encounter (Signed)
Oncology Nurse Navigator Documentation  Oncology Nurse Navigator Flowsheets 05/22/2016  Navigator Location [facility]  Navigator Encounter Type Telephone/I spoke with Michele [name] yesterday regarding Michele [name].  She felt that if I could call and update her on if she is able to go out in the community a visit people.  I updated Dr. Julien Nordmann and he is ok with her seeing friends and family.  I called Michele [name] and update her being able to go out a visit friends and family.  She is currently not on treatment. She was thankful for the call.   Telephone Outgoing Call  Treatment Phase Post-Tx Follow-up  Barriers/Navigation Needs Education  Education Other  Interventions Education  Education Method Verbal  Acuity Level 2  Acuity Level 2 Educational needs  Time Spent with Patient 15

## 2016-05-23 ENCOUNTER — Encounter: Payer: Self-pay | Admitting: Physician Assistant

## 2016-05-24 ENCOUNTER — Other Ambulatory Visit: Payer: Medicare Other

## 2016-05-24 ENCOUNTER — Emergency Department (HOSPITAL_COMMUNITY)
Admission: EM | Admit: 2016-05-24 | Discharge: 2016-05-24 | Disposition: A | Discharge status: Home / Self Care | Payer: Medicare Other | Attending: Emergency Medicine | Admitting: Emergency Medicine

## 2016-05-24 ENCOUNTER — Encounter (HOSPITAL_COMMUNITY): Payer: Self-pay | Admitting: *Deleted

## 2016-05-24 DIAGNOSIS — W2201XA Walked into wall, initial encounter: Secondary | ICD-10-CM | POA: Insufficient documentation

## 2016-05-24 DIAGNOSIS — Y9301 Activity, walking, marching and hiking: Secondary | ICD-10-CM | POA: Diagnosis not present

## 2016-05-24 DIAGNOSIS — Z87891 Personal history of nicotine dependence: Secondary | ICD-10-CM | POA: Insufficient documentation

## 2016-05-24 DIAGNOSIS — Z7902 Long term (current) use of antithrombotics/antiplatelets: Secondary | ICD-10-CM | POA: Diagnosis not present

## 2016-05-24 DIAGNOSIS — Y929 Unspecified place or not applicable: Secondary | ICD-10-CM | POA: Diagnosis not present

## 2016-05-24 DIAGNOSIS — Z955 Presence of coronary angioplasty implant and graft: Secondary | ICD-10-CM | POA: Insufficient documentation

## 2016-05-24 DIAGNOSIS — S0081XA Abrasion of other part of head, initial encounter: Secondary | ICD-10-CM | POA: Insufficient documentation

## 2016-05-24 DIAGNOSIS — I129 Hypertensive chronic kidney disease with stage 1 through stage 4 chronic kidney disease, or unspecified chronic kidney disease: Secondary | ICD-10-CM | POA: Diagnosis not present

## 2016-05-24 DIAGNOSIS — R531 Weakness: Secondary | ICD-10-CM | POA: Diagnosis not present

## 2016-05-24 DIAGNOSIS — I2510 Atherosclerotic heart disease of native coronary artery without angina pectoris: Secondary | ICD-10-CM | POA: Diagnosis not present

## 2016-05-24 DIAGNOSIS — Z79899 Other long term (current) drug therapy: Secondary | ICD-10-CM | POA: Insufficient documentation

## 2016-05-24 DIAGNOSIS — J449 Chronic obstructive pulmonary disease, unspecified: Secondary | ICD-10-CM | POA: Diagnosis not present

## 2016-05-24 DIAGNOSIS — I252 Old myocardial infarction: Secondary | ICD-10-CM | POA: Insufficient documentation

## 2016-05-24 DIAGNOSIS — R404 Transient alteration of awareness: Secondary | ICD-10-CM | POA: Diagnosis not present

## 2016-05-24 DIAGNOSIS — N189 Chronic kidney disease, unspecified: Secondary | ICD-10-CM | POA: Insufficient documentation

## 2016-05-24 DIAGNOSIS — Y999 Unspecified external cause status: Secondary | ICD-10-CM | POA: Insufficient documentation

## 2016-05-24 DIAGNOSIS — S0990XA Unspecified injury of head, initial encounter: Secondary | ICD-10-CM | POA: Diagnosis present

## 2016-05-24 DIAGNOSIS — N939 Abnormal uterine and vaginal bleeding, unspecified: Secondary | ICD-10-CM | POA: Insufficient documentation

## 2016-05-24 LAB — URINALYSIS, ROUTINE W REFLEX MICROSCOPIC
Bilirubin Urine: NEGATIVE
Glucose, UA: NEGATIVE mg/dL
Ketones, ur: 5 mg/dL — AB
Leukocytes, UA: NEGATIVE
Nitrite: NEGATIVE
Protein, ur: NEGATIVE mg/dL
Specific Gravity, Urine: 1.009 (ref 1.005–1.030)
pH: 5 (ref 5.0–8.0)

## 2016-05-24 LAB — CBC WITH DIFFERENTIAL/PLATELET
Basophils Absolute: 0 10*3/uL (ref 0.0–0.1)
Basophils Relative: 0 %
Eosinophils Absolute: 0 10*3/uL (ref 0.0–0.7)
Eosinophils Relative: 0 %
HCT: 30.9 % — ABNORMAL LOW (ref 36.0–46.0)
Hemoglobin: 10.3 g/dL — ABNORMAL LOW (ref 12.0–15.0)
Lymphocytes Relative: 9 %
Lymphs Abs: 0.8 10*3/uL (ref 0.7–4.0)
MCH: 32.2 pg (ref 26.0–34.0)
MCHC: 33.3 g/dL (ref 30.0–36.0)
MCV: 96.6 fL (ref 78.0–100.0)
Monocytes Absolute: 0.6 10*3/uL (ref 0.1–1.0)
Monocytes Relative: 6 %
Neutro Abs: 7.8 10*3/uL — ABNORMAL HIGH (ref 1.7–7.7)
Neutrophils Relative %: 85 %
Platelets: 192 10*3/uL (ref 150–400)
RBC: 3.2 MIL/uL — ABNORMAL LOW (ref 3.87–5.11)
RDW: 17.5 % — ABNORMAL HIGH (ref 11.5–15.5)
WBC: 9.2 10*3/uL (ref 4.0–10.5)

## 2016-05-24 LAB — BASIC METABOLIC PANEL
Anion gap: 9 (ref 5–15)
BUN: 8 mg/dL (ref 6–20)
CO2: 24 mmol/L (ref 22–32)
Calcium: 9.3 mg/dL (ref 8.9–10.3)
Chloride: 107 mmol/L (ref 101–111)
Creatinine, Ser: 0.6 mg/dL (ref 0.44–1.00)
GFR calc Af Amer: >60 mL/min (ref >60)
GFR calc non Af Amer: >60 mL/min (ref >60)
Glucose, Bld: 97 mg/dL (ref 65–99)
Potassium: 3.5 mmol/L (ref 3.5–5.1)
Sodium: 140 mmol/L (ref 135–145)

## 2016-05-24 MED ORDER — ACETAMINOPHEN 500 MG PO TABS
1000.0000 mg | ORAL_TABLET | Freq: Once | ORAL | Status: AC
  Administered 2016-05-24: 1000 mg via ORAL
  Filled 2016-05-24: qty 2

## 2016-05-24 NOTE — ED Notes (Signed)
Unsuccessful blood stick left AC. RN aware.

## 2016-05-24 NOTE — ED Notes (Signed)
Bed: YD74 Expected date: 05/24/16 Expected time: 5:27 PM Means of arrival: Ambulance Comments: Generalized Weakness
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
## 2016-05-24 NOTE — ED Triage Notes (Signed)
Per EMS report: pt coming from home with weakness x 3 days.  Pt reports increasing weakness today. Pt is a CA pt with last chemo tx 5 weeks ago.  Pt also reported she hit a wall this morning while walking around in the dark.  No obvious injury noted. EMS report pt is able to ambulated with a stand-by assist.

## 2016-05-28 ENCOUNTER — Telehealth: Payer: Self-pay | Admitting: Internal Medicine

## 2016-05-28 ENCOUNTER — Ambulatory Visit (HOSPITAL_BASED_OUTPATIENT_CLINIC_OR_DEPARTMENT_OTHER): Payer: Medicare Other | Admitting: Internal Medicine

## 2016-05-28 ENCOUNTER — Other Ambulatory Visit: Payer: Medicare Other

## 2016-05-28 ENCOUNTER — Encounter: Payer: Self-pay | Admitting: Internal Medicine

## 2016-05-28 VITALS — BP 126/64 | HR 97 | Temp 98.2°F | Resp 16 | Ht 65.5 in | Wt 127.1 lb

## 2016-05-28 DIAGNOSIS — C771 Secondary and unspecified malignant neoplasm of intrathoracic lymph nodes: Secondary | ICD-10-CM

## 2016-05-28 DIAGNOSIS — C3491 Malignant neoplasm of unspecified part of right bronchus or lung: Secondary | ICD-10-CM

## 2016-05-28 NOTE — Telephone Encounter (Signed)
Appointments scheduled per 12/26 LOS. Patient given AVS report and calendars with future scheduled appointments.
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
## 2016-05-29 DIAGNOSIS — N281 Cyst of kidney, acquired: Secondary | ICD-10-CM | POA: Diagnosis not present

## 2016-05-29 DIAGNOSIS — R739 Hyperglycemia, unspecified: Secondary | ICD-10-CM | POA: Diagnosis not present

## 2016-05-30 ENCOUNTER — Other Ambulatory Visit: Payer: Self-pay | Admitting: Family Medicine

## 2016-05-30 DIAGNOSIS — Z1231 Encounter for screening mammogram for malignant neoplasm of breast: Secondary | ICD-10-CM

## 2016-06-04 ENCOUNTER — Encounter: Payer: Self-pay | Admitting: General Practice

## 2016-06-04 ENCOUNTER — Other Ambulatory Visit: Payer: Medicare Other

## 2016-06-04 NOTE — Progress Notes (Signed)
[place] Spiritual Care Note  Followed up by phone after sending Michele [name] the packet of [place] resources last month.  She sounded more upbeat in this encounter, noting that she's holing up at home while it's so cold (unusually cold temps recently), but is interested in yoga and possibly tai chi because she's aware that she's deconditioned and wants to be more active.  Encouraged Michele [name] to reach out anytime for further emotional/spiritual support, particularly as we are building rapport and she verbalized appreciation for support and encouragement.  She has [facility] team's contact info in her packet and plans to call as desired. Please also page if immediate needs arise.  Thank you.   [place], [place], [facility] Pager [phone] Voicemail [phone]

## 2016-06-06 ENCOUNTER — Ambulatory Visit: Payer: Medicare Other | Admitting: Obstetrics & Gynecology

## 2016-06-07 ENCOUNTER — Telehealth: Payer: Self-pay | Admitting: Nutrition

## 2016-06-07 ENCOUNTER — Encounter: Payer: Self-pay | Admitting: Radiation Oncology

## 2016-06-07 NOTE — Telephone Encounter (Signed)
Follow-up completed with patient over telephone because she had questions regarding nutrition. Educated patient to consume Glucerna 3 times a day between meals. Encouraged carbohydrate controlled diet.  However, discouraged her from eliminating carbohydrates completely. Encouraged her to contact me directly if she has questions or concerns.

## 2016-06-07 NOTE — Telephone Encounter (Signed)
Contacted patient because she had questions

## 2016-06-12 ENCOUNTER — Ambulatory Visit (HOSPITAL_COMMUNITY)
Admission: RE | Admit: 2016-06-12 | Discharge: 2016-06-12 | Disposition: A | Discharge status: Home / Self Care | Payer: Medicare Other | Source: Ambulatory Visit | Attending: Radiation Oncology | Admitting: Radiation Oncology

## 2016-06-12 DIAGNOSIS — C3491 Malignant neoplasm of unspecified part of right bronchus or lung: Secondary | ICD-10-CM | POA: Insufficient documentation

## 2016-06-12 DIAGNOSIS — C3490 Malignant neoplasm of unspecified part of unspecified bronchus or lung: Secondary | ICD-10-CM | POA: Diagnosis not present

## 2016-06-12 MED ORDER — GADOBENATE DIMEGLUMINE 529 MG/ML IV SOLN
13.0000 mL | Freq: Once | INTRAVENOUS | Status: AC | PRN
  Administered 2016-06-12: 13 mL via INTRAVENOUS

## 2016-06-14 ENCOUNTER — Ambulatory Visit
Admission: RE | Admit: 2016-06-14 | Discharge: 2016-06-14 | Disposition: A | Discharge status: Home / Self Care | Payer: Medicare Other | Source: Ambulatory Visit | Attending: Radiation Oncology | Admitting: Radiation Oncology

## 2016-06-14 ENCOUNTER — Encounter: Payer: Self-pay | Admitting: Radiation Oncology

## 2016-06-14 VITALS — BP 114/65 | HR 80 | Temp 98.2°F | Ht 65.5 in | Wt 125.8 lb

## 2016-06-14 DIAGNOSIS — F064 Anxiety disorder due to known physiological condition: Secondary | ICD-10-CM | POA: Insufficient documentation

## 2016-06-14 DIAGNOSIS — C771 Secondary and unspecified malignant neoplasm of intrathoracic lymph nodes: Secondary | ICD-10-CM | POA: Insufficient documentation

## 2016-06-14 DIAGNOSIS — R05 Cough: Secondary | ICD-10-CM | POA: Insufficient documentation

## 2016-06-14 DIAGNOSIS — Z79899 Other long term (current) drug therapy: Secondary | ICD-10-CM | POA: Diagnosis not present

## 2016-06-14 DIAGNOSIS — R0602 Shortness of breath: Secondary | ICD-10-CM | POA: Diagnosis not present

## 2016-06-14 DIAGNOSIS — R51 Headache: Secondary | ICD-10-CM | POA: Insufficient documentation

## 2016-06-14 DIAGNOSIS — C3491 Malignant neoplasm of unspecified part of right bronchus or lung: Secondary | ICD-10-CM | POA: Insufficient documentation

## 2016-06-14 DIAGNOSIS — Z923 Personal history of irradiation: Secondary | ICD-10-CM | POA: Diagnosis not present

## 2016-06-14 DIAGNOSIS — Z888 Allergy status to other drugs, medicaments and biological substances status: Secondary | ICD-10-CM | POA: Insufficient documentation

## 2016-06-14 DIAGNOSIS — Y842 Radiological procedure and radiotherapy as the cause of abnormal reaction of the patient, or of later complication, without mention of misadventure at the time of the procedure: Secondary | ICD-10-CM | POA: Diagnosis not present

## 2016-06-14 HISTORY — DX: Personal history of irradiation: Z92.3

## 2016-06-14 NOTE — Progress Notes (Signed)
Michele [name] is here for follow up of radiation completed 05/06/16 to her Mediastinum. She reports generalized pain. She has pain in her bilateral arms related to feeling "tight". She reports pain to her Right eye from bumping into the wall several weeks ago. She reports pain her upper abdomen area. She has had some vaginal spotting over the past month since completing radiation. She noticed this was after intercourse, and at times when she wiped herself. She reports continued shortness of breath, her oxygen saturation was 99 % on room air today. She has an occasional cough. She has a decreased appetite, and has been losing weight recently, she is concerned about this. She denies pain while swallowing. She had a MRI of her brain 06/12/16, and is here to receive the results.   BP 114/65   Pulse 80   Temp 98.2 F (36.8 C)   Ht 5' 5.5" (1.664 m)   Wt 125 lb 12.8 oz (57.1 kg)   SpO2 99% Comment: room air  BMI 20.62 kg/m    Wt Readings from Last 3 Encounters:  06/14/16 125 lb 12.8 oz (57.1 kg)  05/28/16 127 lb 1.6 oz (57.7 kg)  05/22/16 130 lb 6.4 oz (59.1 kg)

## 2016-06-14 NOTE — Progress Notes (Signed)
Radiation Oncology         [phone] ________________________________  Name: Michele [name] MRN: [id number]  Date: 06/14/2016  DOB: 12-18-1955  Follow-Up Visit Note  Outpatient  CC: Michele Frees, MD  Curt Bears, MD  Diagnosis and Prior Radiotherapy:    ICD-9-CM ICD-10-CM   1. 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        On review of systems, the patient reports generalized pain. She reports pain in bilateral arms that feels "tight." Additionally, she has pain to her right eye from bumping into a wall several weeks ago. She also reports pain in her upper abdomen areas. The patient reports "roaring" headaches which keep her up at night when she is trying to sleep. She reports some vaginal spotting over the past month since completing radiation. She noticed that this spotting occurs after intercourse, and at times when she wiped herself. The patient reports continued shortness of breath, with an occasional cough. She has a decreased appetite, and has been losing weight recently, which concerns her. She reports she is on Klonopin, but that it "doesn't really help." She suspects she may have some anxiety related to all of her recent health problems. The patient denies pain with swallowing. She had an MRI of her brain on 06/12/16, and is here today to review the results with her husband. No evidence of brain metastasis. Reports baseline cognitive slowing, not severe   Most recent Chest CT scan  reviewed by me shows overall significant response to CHRT compared to her staging scans.  She sees a Loss adjuster, chartered at [facility] next week re: vaginal bleeding             ALLERGIES:  is allergic to ciprofloxacin; aspirin; crestor [rosuvastatin]; ibuprofen; wellbutrin [bupropion]; lipitor [atorvastatin]; and sulfonamide derivatives.  Meds: Current Outpatient Prescriptions  Medication Sig Dispense Refill  . Calcium Carbonate-Vitamin D (CALCIUM 600+D PO) Take 1 tablet by mouth daily.     . carvedilol (COREG) 6.25 MG tablet Take 1 tablet (6.25 mg total) by mouth 2 (two) times daily with a meal. 60 tablet 5  . clonazePAM (KLONOPIN) 2 MG tablet Take 2 mg by mouth 3 (three) times daily as needed for anxiety.     . clopidogrel (PLAVIX) 75 MG tablet TAKE 1 TABLET(75 MG) BY MOUTH DAILY 30 tablet 11  . Coenzyme Q10 (COQ10) 100 MG CAPS Take 100 mg by mouth daily. Reported on 10/16/2015    . isosorbide mononitrate (IMDUR) 30 MG 24 hr tablet TAKE 1 TABLET(30 MG) BY MOUTH AT BEDTIME 90 tablet 1  . pantoprazole (PROTONIX) 40 MG tablet Take 1 tablet (40 mg total) by mouth daily. 30 tablet 10  . polyethylene glycol (MIRALAX / GLYCOLAX) packet Take 17 g by mouth daily. 14 each 0  . pravastatin (PRAVACHOL) 40 MG tablet TAKE 1 TABLET(40 MG) BY MOUTH EVERY EVENING 90 tablet 2  . sennosides-docusate sodium (SENOKOT-S) 8.6-50 MG tablet Take 1 tablet by mouth 2 (two) times daily.    [name]  tiotropium (SPIRIVA) 18 MCG inhalation capsule Place 18 mcg into inhaler and inhale daily.    [name] VASCEPA 1 g CAPS TAKE 1 CAPSULES BY MOUTH TWICE DAILY (Patient taking differently: taken once daily) 60 capsule 5  . guaiFENesin-codeine (ROBITUSSIN AC) 100-10 MG/5ML syrup Take 5 mLs by mouth 3 (three) times daily as needed for cough. (Patient not taking: Reported on 06/14/2016) 120 mL 0  . HYDROcodone-acetaminophen (NORCO) 5-325 MG tablet Take 1-2 tablets by mouth every 6 (six) hours as needed for severe pain. (Patient not taking:  Reported on 06/14/2016) 45 tablet 0  . nitroGLYCERIN (NITROSTAT) 0.4 MG SL tablet Place 1 tablet (0.4 mg total) under the tongue every 5 (five) minutes as needed for chest pain. (Patient not taking: Reported on 06/14/2016) 25 tablet 1  . VENTOLIN HFA 108 (90 Base) MCG/ACT inhaler      No current facility-administered medications for this encounter.    Facility-Administered Medications Ordered in Other Encounters  Medication Dose Route Frequency Provider Last Rate Last Dose  . HYDROcodone-acetaminophen (NORCO/VICODIN) 5-325 MG per tablet 1 tablet  1 tablet Oral Once Susanne Borders, NP        Physical Findings: The patient is in no acute distress. Patient is alert and oriented.  height is 5' 5.5" (1.664 m) and weight is 125 lb 12.8 oz (57.1 kg). Her temperature is 98.2 F (36.8 C). Her blood pressure is 114/65 and her pulse is 80. Her oxygen saturation is 99%.   General: Alert and oriented, in mild distress. HEENT: Oropharynx and oral cavity are clear. Neck: Neck is supple, no palpable cervical or supraclavicular lymphadenopathy. Heart: Regular in rate and rhythm with no murmurs. Chest: Clear to auscultation bilaterally. Abdomen: Soft, nontender, nondistended, with no rigidity or guarding. Extremities: No edema in lower extremities. Some mild swelling noted in the right hand. Lymphatics: see Neck Exam Skin: No concerning lesions. Some bruising noted over the forearms   Musculoskeletal: symmetric strength and muscle tone throughout.  Lab Findings: Lab Results  Component Value Date   WBC 9.2 05/24/2016   HGB 10.3 (L) 05/24/2016   HCT 30.9 (L) 05/24/2016   MCV 96.6 05/24/2016   PLT 192 05/24/2016    Radiographic Findings: Ct Soft Tissue Neck W Contrast  Result Date: 05/20/2016 CLINICAL DATA:  Small cell lung carcinoma.  Restaging EXAM: CT NECK WITH CONTRAST TECHNIQUE: Multidetector CT imaging of the neck was performed using the standard protocol following the bolus administration of  intravenous contrast. CONTRAST:  113m ISOVUE-300 IOPAMIDOL (ISOVUE-300) INJECTION 61% COMPARISON:  PET 02/16/2016 FINDINGS: Pharynx and larynx: Nasopharynx normal. Oropharynx normal. Uptake at the base of the tongue on PET scan does not correlate with any mass lesion on CT. Direct visualization may be helpful. Salivary glands: Negative Thyroid: Negative Lymph nodes: No pathologic adenopathy in the neck. Vascular: Carotid artery calcification bilaterally. Carotid artery and jugular vein patent bilaterally. Limited intracranial: Negative Visualized orbits: Negative Mastoids and visualized paranasal sinuses: Negative Skeleton: Cervical disc degeneration and spurring. Negative for skeletal metastasis. Upper chest: Chest CT from today reported separately. Other: None IMPRESSION: Negative for metastatic disease in the neck.  No adenopathy. Increased uptake at the base the tongue on PET does not correlate with a mass lesion on today's study. Electronically Signed   By: CFranchot GalloM.D.   On: 05/20/2016 14:53   Ct Chest W Contrast  Result Date: 05/20/2016 CLINICAL DATA:  Small cell carcinoma of right lung. Restaging. Chemotherapy and radiation therapy complete. Cough for 2 weeks. EXAM: CT  CHEST WITH CONTRAST TECHNIQUE: Multidetector CT imaging of the chest was performed during intravenous contrast administration. CONTRAST:  183m ISOVUE-300 IOPAMIDOL (ISOVUE-300) INJECTION 61% COMPARISON:  05/15/2016 chest radiograph.  CT 04/11/2016. FINDINGS: Cardiovascular: Aortic and branch vessel atherosclerosis. Tortuous thoracic aorta. Normal heart size, without pericardial effusion. Multivessel coronary artery atherosclerosis. No central pulmonary embolism, on this non-dedicated study. Mediastinum/Nodes: No supraclavicular adenopathy. Precarinal node measures 1.3 x 1.3 cm on image 24/series 5. Compare 1.4 x 1.9 cm on the prior exam. Right paratracheal node measures 8 mm on image 19/series 5 and is unchanged. No hilar  adenopathy. Small hiatal hernia. Lungs/Pleura: No pleural fluid.  advanced bullous type emphysema. Right upper lobe nodularity, including at 4 mm on image 77/series 6. Similar back to 02/09/2016. Upper Abdomen: Normal imaged portions of the liver, spleen, pancreas, gallbladder, biliary tract, adrenal glands, left kidney. An interpolar right renal lesion th similar in size measures 1.5 cm on image 61/series 5. Fluid density on coronal reformats. Advanced abdominal aortic atherosclerosis. Musculoskeletal: No acute osseous abnormality. IMPRESSION: 1. Minimal improvement in thoracic adenopathy. 2. No new or progressive disease. 3. Age advanced coronary artery atherosclerosis. Recommend assessment of coronary risk factors and consideration of medical therapy. Aortic atherosclerosis. Electronically Signed   By: KAbigail MiyamotoM.D.   On: 05/20/2016 16:17   Mr BJeri CosWWPContrast  Result Date: 06/12/2016 CLINICAL DATA:  61y/o F; small-cell carcinoma of the lung. SRS study. EXAM: MRI HEAD WITHOUT AND WITH CONTRAST TECHNIQUE: Multiplanar, multiecho pulse sequences of the brain and surrounding structures were obtained without and with intravenous contrast. CONTRAST:  175mMULTIHANCE GADOBENATE DIMEGLUMINE 529 MG/ML IV SOLN COMPARISON:  04/01/2016 and 09/06/2007 MRI of the brain. FINDINGS: Brain: No acute infarction, hemorrhage, hydrocephalus, extra-axial collection or mass lesion. No abnormal enhancement of the brain. Incidental partially empty sella turcica is stable. Few punctate foci of T2 FLAIR hyperintensity in white matter are compatible with minimal chronic microvascular ischemic changes. Mild brain parenchymal volume loss involving supratentorial and infratentorial brain. Vascular: Normal flow voids. Skull and upper cervical spine: Normal marrow signal. Sinuses/Orbits: 7 mm enhancing focus within the left anterior maxillary wall stable from 2009 compatible with benign etiology. No acute sinus disease. Other: None.  IMPRESSION: No acute intracranial abnormality or evidence for metastatic disease. No significant interval change. Electronically Signed   By: LaKristine Garbe.D.   On: 06/12/2016 15:30    Impression/Plan: Limited Stage IIIA T0, N2, M0  small cell lung cancer, treated with radiation.  The patient is recovering from the effects of radiation. There is no evidence of disease recurrence at this time.  I encouraged the patient to meet with [facility] again to discuss her weight loss concerns. I encouraged her that meeting with nutrition could help her select foods that would help with weight gain, without affecting her borderline diabetes. Additionally, I encouraged the patient to consider meeting with Social Work to discuss non-pharmaceutical strategies to combat her anxiety from her dx and treatment. She is in agreement and I will refer her to both of these services, requesting that they see her in conjunction with another appointment to limit the separate trips the patient must make.  We reviewed the option for prophylactic brain radiation to reduce the risk of disease recurrence in the brain and improve life expectancy. We discussed the possible risks, benefits, and side effects of this radiation including fatigue and cognitive changes. This would entail 2 weeks of radiation to the brain. The patient is agreeable and  would like to proceed with radiation. She will be scheduled for CT Simulation and planning 06/21/16, with the intent to begin radiation treatments on 06/24/16 if possible.     _____________________________________   [name], MD  This document serves as a record of services personally performed by [name], MD. It was created on her behalf by Maryla Morrow, a trained medical scribe. The creation of this record is based on the scribe's personal observations and the provider's statements to them. This document has been checked and approved by the attending  provider.

## 2016-06-17 ENCOUNTER — Ambulatory Visit (INDEPENDENT_AMBULATORY_CARE_PROVIDER_SITE_OTHER): Payer: Medicare Other | Admitting: Clinical

## 2016-06-17 ENCOUNTER — Encounter: Payer: Self-pay | Admitting: Obstetrics & Gynecology

## 2016-06-17 ENCOUNTER — Other Ambulatory Visit (HOSPITAL_COMMUNITY)
Admission: RE | Admit: 2016-06-17 | Discharge: 2016-06-17 | Disposition: A | Discharge status: Home / Self Care | Payer: Medicare Other | Source: Ambulatory Visit | Attending: Obstetrics & Gynecology | Admitting: Obstetrics & Gynecology

## 2016-06-17 ENCOUNTER — Ambulatory Visit (INDEPENDENT_AMBULATORY_CARE_PROVIDER_SITE_OTHER): Payer: Medicare Other | Admitting: Obstetrics & Gynecology

## 2016-06-17 DIAGNOSIS — N950 Postmenopausal bleeding: Secondary | ICD-10-CM | POA: Insufficient documentation

## 2016-06-17 DIAGNOSIS — F331 Major depressive disorder, recurrent, moderate: Secondary | ICD-10-CM | POA: Diagnosis not present

## 2016-06-17 NOTE — BH Specialist Note (Signed)
Session Start time: 11:50   End Time: 12:10 Total Time:  20 minutes Type of Service: [place] Interpreter: No.   Interpreter Name & Language: n/a # [facility] Visits July 2017-June 2018: 1st  SUBJECTIVE: Michele [name] is a 61 y.o. female  Pt. was referred by Dr Hulan Fray for:  anxiety and depression. Pt. reports the following symptoms/concerns: Pt states that she has slept very little the past two weeks, poor appetite, and worries about not being able to gain weight. Valium used to help with depression(not taking currently), Klonopin is helping "a little" with the anxiety. Duration of problem:  Over two weeks Severity: moderate Previous treatment: Previously treated with Valium "for many years", on Klonopin now  OBJECTIVE: Mood: Depressed & Affect: Depressed Risk of harm to self or others: No known risk of harm to self or others Assessments administered: PHQ9: 14/ GAD7: 8  LIFE CONTEXT:  Family & Social: Lives with husband, feels [facility] has been supportive School/ Work: Undetermined Self-Care: Lack of sleep, poor appetite Life changes: currently in cancer treatments What is important to pt/family (values): Self-care  GOALS ADDRESSED:  -Reduce symptoms of depression  INTERVENTIONS: Strength-based and Supportive   ASSESSMENT:  Pt currently experiencing Moderate recurrent episode of major depressive disorder.  Pt may benefit from psychoeducation and brief therapeutic intervention regarding coping with symptoms of depression.   PLAN: 1. F/U with behavioral health clinician: Two weeks, as needed 2. Behavioral Health meds: Klonopin 3. Behavioral recommendations:  -Consider [place] classes(Tai Chi, yoga, etc.) within the next month, even if weather stays cold -Try out Relax Melodies app, nightly, to determine if it helps with relaxation and pain relief to aid in sleep -Talk to physician about possibility of taking melatonin OTC for  improved sleep -Read educational material regarding coping with depression(and anxiety) 4. Referral: Brief Counseling/Psychotherapy and Psychoeducation 5. From scale of 1-10, how likely are you to follow plan: [place]:   Warm Hand Off Completed.        Depression screen [facility] 2/9 06/17/2016 06/14/2016 03/08/2016  Decreased Interest 3 0 0  Down, Depressed, Hopeless 2 0 0  PHQ - 2 Score 5 0 0  Altered sleeping 3 - -  Tired, decreased energy 3 - -  Change in appetite 3 - -  Feeling bad or failure about yourself  0 - -  Trouble concentrating 0 - -  Moving slowly or fidgety/restless 0 - -  Suicidal thoughts 0 - -  PHQ-9 Score 14 - -  Some recent data might be hidden   GAD 7 : Generalized Anxiety Score 06/17/2016  Nervous, Anxious, on Edge 1  Control/stop worrying 1  Worry too much - different things 1  Trouble relaxing 1  Restless 0  Easily annoyed or irritable 1  Afraid - awful might happen 3  Total GAD 7 Score 8

## 2016-06-17 NOTE — Progress Notes (Signed)
   Subjective:    Patient ID: Michele [name], female    DOB: [birth year], 61 y.o.   MRN: [id number]  HPI 61 yo SW lady here because of PMB that was first noted 10/17, with the start of her chemo.   Review of Systems Sexually active 18 years monogamous Pap normal 4/17    Objective:   Physical Exam [id number] Breathing, conversing, and ambulating normally  UPT negative, consent signed, time out done Cervix prepped with betadine and grasped with a single tooth tenaculum Uterus sounded to 9 cm Pipelle used for 2 passes with a very small amount of tissue obtained. She tolerated the procedure well.      Assessment & Plan:  PMB- await pathology Schedule gyn u/s

## 2016-06-18 ENCOUNTER — Telehealth: Payer: Self-pay | Admitting: General Practice

## 2016-06-18 ENCOUNTER — Telehealth: Payer: Self-pay | Admitting: *Deleted

## 2016-06-18 NOTE — Telephone Encounter (Signed)
Patient called and left message requesting test results from yesterday's biopsy. Called patient and told her that test usually takes a week to result. Patient verbalized understanding & had no questions

## 2016-06-18 NOTE — Telephone Encounter (Signed)
PATIENT REFUSED TO SEE NUTRITIONIST ON 06-24-16 DUE TO TRANSPORTATION, APPT. HAS BEEN RESCHEDULED FOR 07-01-16 @ 11:15 AM WITH BARBARA NEFF, PT. AGREED TO NEW TIME AND DATE

## 2016-06-18 NOTE — Telephone Encounter (Signed)
CALLED PATIENT TO INFORM OF NUTRITION APPT. FOR 06/24/16 @ 10:30 AM WITH BARBARA NEFF, LVM FOR A RETURN CALL

## 2016-06-20 ENCOUNTER — Other Ambulatory Visit: Payer: Self-pay | Admitting: Cardiology

## 2016-06-21 ENCOUNTER — Telehealth: Payer: Self-pay

## 2016-06-21 ENCOUNTER — Ambulatory Visit: Payer: Medicare Other | Admitting: Radiation Oncology

## 2016-06-21 NOTE — Telephone Encounter (Signed)
Rx has been sent to the pharmacy electronically. ° °

## 2016-06-21 NOTE — Telephone Encounter (Signed)
Counseling Note  Received referral from Wellington Mullis/LCSW. LVM to inform patient of individual counseling opportunity. Encouraged patient to return call. Will f/u with patient next week if she has not returned call.   Michele Pam, MS LPCA [facility] Temporary phone: [phone]

## 2016-06-21 NOTE — Telephone Encounter (Addendum)
Per Dr. Hulan Fray, pt needs to be informed that her biospy is negative.  Notified pt of results.  Pt stated that she was suppose to have an [place] scheduled.  Providers placed [place] order but was not scheduled.  [place] scheduled for February 8th @ 1100.  Pt notified and no further questions.

## 2016-06-24 ENCOUNTER — Encounter: Payer: Self-pay | Admitting: Nutrition

## 2016-06-24 ENCOUNTER — Encounter: Payer: Self-pay | Admitting: Obstetrics & Gynecology

## 2016-06-24 ENCOUNTER — Ambulatory Visit
Admission: RE | Admit: 2016-06-24 | Discharge: 2016-06-24 | Disposition: A | Discharge status: Home / Self Care | Payer: Medicare Other | Source: Ambulatory Visit | Attending: Radiation Oncology | Admitting: Radiation Oncology

## 2016-06-24 ENCOUNTER — Ambulatory Visit: Payer: Medicare Other | Admitting: Radiation Oncology

## 2016-06-24 DIAGNOSIS — Z510 Encounter for antineoplastic radiation therapy: Secondary | ICD-10-CM | POA: Insufficient documentation

## 2016-06-24 DIAGNOSIS — C771 Secondary and unspecified malignant neoplasm of intrathoracic lymph nodes: Secondary | ICD-10-CM | POA: Insufficient documentation

## 2016-06-24 DIAGNOSIS — F329 Major depressive disorder, single episode, unspecified: Secondary | ICD-10-CM | POA: Insufficient documentation

## 2016-06-24 DIAGNOSIS — F4310 Post-traumatic stress disorder, unspecified: Secondary | ICD-10-CM | POA: Diagnosis not present

## 2016-06-24 DIAGNOSIS — F419 Anxiety disorder, unspecified: Secondary | ICD-10-CM | POA: Insufficient documentation

## 2016-06-24 DIAGNOSIS — X58XXXA Exposure to other specified factors, initial encounter: Secondary | ICD-10-CM | POA: Insufficient documentation

## 2016-06-24 NOTE — Progress Notes (Signed)
  SIMULATION AND TREATMENT PLANNING NOTE  Outpatient  DIAGNOSIS:     ICD-9-CM ICD-10-CM   1. 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  Identity was confirmed.  All relevant records and images related to the planned course of therapy were reviewed.  The patient freely provided informed written consent to proceed with treatment after reviewing the details related to the planned course of therapy. The consent form was witnessed and verified by the simulation staff.    Then, the patient was set-up in a stable reproducible  supine position for radiation therapy.  An Aquaplast facemask was custom fitted to the patient's anatomy.  CT images were obtained.  Surface markings were placed.  The CT images were loaded into the planning software.      TREATMENT PLANNING NOTE: Treatment planning then occurred.  The radiation prescription was entered and confirmed.    A total of 3 medically necessary complex treatment devices were fabricated and supervised by me, in the form of an Aquaplast mask and 2 fields with MLCs to block globes, pharynx, spinal cord, lenses. Wedges will be used as needed for dose homogeneity.  MORE FIELDS WITH MLCs MAY BE ADDED IN DOSIMETRY for dose homogeneity.  I have requested : Isodose Plan.     The patient will receive 25 Gy in 10 fractions prophylactically to the whole brain.  -----------------------------------  [name], MD

## 2016-06-25 ENCOUNTER — Ambulatory Visit
Admission: RE | Admit: 2016-06-25 | Discharge: 2016-06-25 | Disposition: A | Discharge status: Home / Self Care | Payer: Medicare Other | Source: Ambulatory Visit | Attending: Radiation Oncology | Admitting: Radiation Oncology

## 2016-06-25 DIAGNOSIS — F4310 Post-traumatic stress disorder, unspecified: Secondary | ICD-10-CM | POA: Diagnosis not present

## 2016-06-25 DIAGNOSIS — Z510 Encounter for antineoplastic radiation therapy: Secondary | ICD-10-CM | POA: Diagnosis not present

## 2016-06-25 DIAGNOSIS — F329 Major depressive disorder, single episode, unspecified: Secondary | ICD-10-CM | POA: Diagnosis not present

## 2016-06-25 DIAGNOSIS — C771 Secondary and unspecified malignant neoplasm of intrathoracic lymph nodes: Secondary | ICD-10-CM | POA: Diagnosis not present

## 2016-06-25 DIAGNOSIS — F419 Anxiety disorder, unspecified: Secondary | ICD-10-CM | POA: Diagnosis not present

## 2016-06-26 ENCOUNTER — Ambulatory Visit
Admission: RE | Admit: 2016-06-26 | Discharge: 2016-06-26 | Disposition: A | Discharge status: Home / Self Care | Payer: Medicare Other | Source: Ambulatory Visit | Attending: Radiation Oncology | Admitting: Radiation Oncology

## 2016-06-26 ENCOUNTER — Encounter: Payer: Self-pay | Admitting: *Deleted

## 2016-06-26 DIAGNOSIS — F329 Major depressive disorder, single episode, unspecified: Secondary | ICD-10-CM | POA: Diagnosis not present

## 2016-06-26 DIAGNOSIS — F4310 Post-traumatic stress disorder, unspecified: Secondary | ICD-10-CM | POA: Diagnosis not present

## 2016-06-26 DIAGNOSIS — C3491 Malignant neoplasm of unspecified part of right bronchus or lung: Secondary | ICD-10-CM

## 2016-06-26 DIAGNOSIS — C771 Secondary and unspecified malignant neoplasm of intrathoracic lymph nodes: Secondary | ICD-10-CM | POA: Diagnosis not present

## 2016-06-26 DIAGNOSIS — F419 Anxiety disorder, unspecified: Secondary | ICD-10-CM | POA: Diagnosis not present

## 2016-06-26 DIAGNOSIS — Z510 Encounter for antineoplastic radiation therapy: Secondary | ICD-10-CM | POA: Diagnosis not present

## 2016-06-26 NOTE — Progress Notes (Signed)
Pt here for patient teaching.  Pt given Radiation and You booklet and skin care instructions. Pt reports they have not watched the Radiation Therapy Education video, they watched it the last time she received radiation.  Reviewed areas of pertinence such as fatigue, hair loss, skin changes, headache and blurry vision . Pt able to give teach back of to pat skin, use unscented/gentle soap and drink plenty of water,. Pt verbalizes understanding of information given and will contact nursing with any questions or concerns.     [email or web address]

## 2016-06-27 ENCOUNTER — Ambulatory Visit
Admission: RE | Admit: 2016-06-27 | Discharge: 2016-06-27 | Disposition: A | Discharge status: Home / Self Care | Payer: Medicare Other | Source: Ambulatory Visit | Attending: Radiation Oncology | Admitting: Radiation Oncology

## 2016-06-27 DIAGNOSIS — Z510 Encounter for antineoplastic radiation therapy: Secondary | ICD-10-CM | POA: Diagnosis not present

## 2016-06-27 DIAGNOSIS — F329 Major depressive disorder, single episode, unspecified: Secondary | ICD-10-CM | POA: Diagnosis not present

## 2016-06-27 DIAGNOSIS — C771 Secondary and unspecified malignant neoplasm of intrathoracic lymph nodes: Secondary | ICD-10-CM | POA: Diagnosis not present

## 2016-06-27 DIAGNOSIS — F4310 Post-traumatic stress disorder, unspecified: Secondary | ICD-10-CM | POA: Diagnosis not present

## 2016-06-27 DIAGNOSIS — F419 Anxiety disorder, unspecified: Secondary | ICD-10-CM | POA: Diagnosis not present

## 2016-06-27 NOTE — Progress Notes (Signed)
[place] Work  Clinical Social Work was referred by Pension scheme manager for assessment of psychosocial needs.  Clinical Social Worker attempted to contact patient by phone to offer support and assess for needs.  CSW left information on [place] class (patient previously expressed interest in) and encouraged patient to return call.  CSW made referral to PhD counseling intern for structured counseling visits.  Counseling intern left voicemail, waiting for return call from patient.   Polo Riley, MSW, LCSW, OSW-C Clinical Social Worker [facility] [phone]

## 2016-06-28 ENCOUNTER — Ambulatory Visit
Admission: RE | Admit: 2016-06-28 | Discharge: 2016-06-28 | Disposition: A | Discharge status: Home / Self Care | Payer: Medicare Other | Source: Ambulatory Visit | Attending: Radiation Oncology | Admitting: Radiation Oncology

## 2016-06-28 DIAGNOSIS — Z510 Encounter for antineoplastic radiation therapy: Secondary | ICD-10-CM | POA: Diagnosis not present

## 2016-06-28 DIAGNOSIS — F419 Anxiety disorder, unspecified: Secondary | ICD-10-CM | POA: Diagnosis not present

## 2016-06-28 DIAGNOSIS — F4310 Post-traumatic stress disorder, unspecified: Secondary | ICD-10-CM | POA: Diagnosis not present

## 2016-06-28 DIAGNOSIS — C771 Secondary and unspecified malignant neoplasm of intrathoracic lymph nodes: Secondary | ICD-10-CM | POA: Diagnosis not present

## 2016-06-28 DIAGNOSIS — F329 Major depressive disorder, single episode, unspecified: Secondary | ICD-10-CM | POA: Diagnosis not present

## 2016-06-28 IMAGING — CR DG CHEST 1V PORT
1 series · 1 of 1 positions shown · non-contrast
Comparison: December 18, 2013.

CLINICAL DATA: Chest pain.

EXAM:
PORTABLE CHEST - 1 VIEW

[portable]
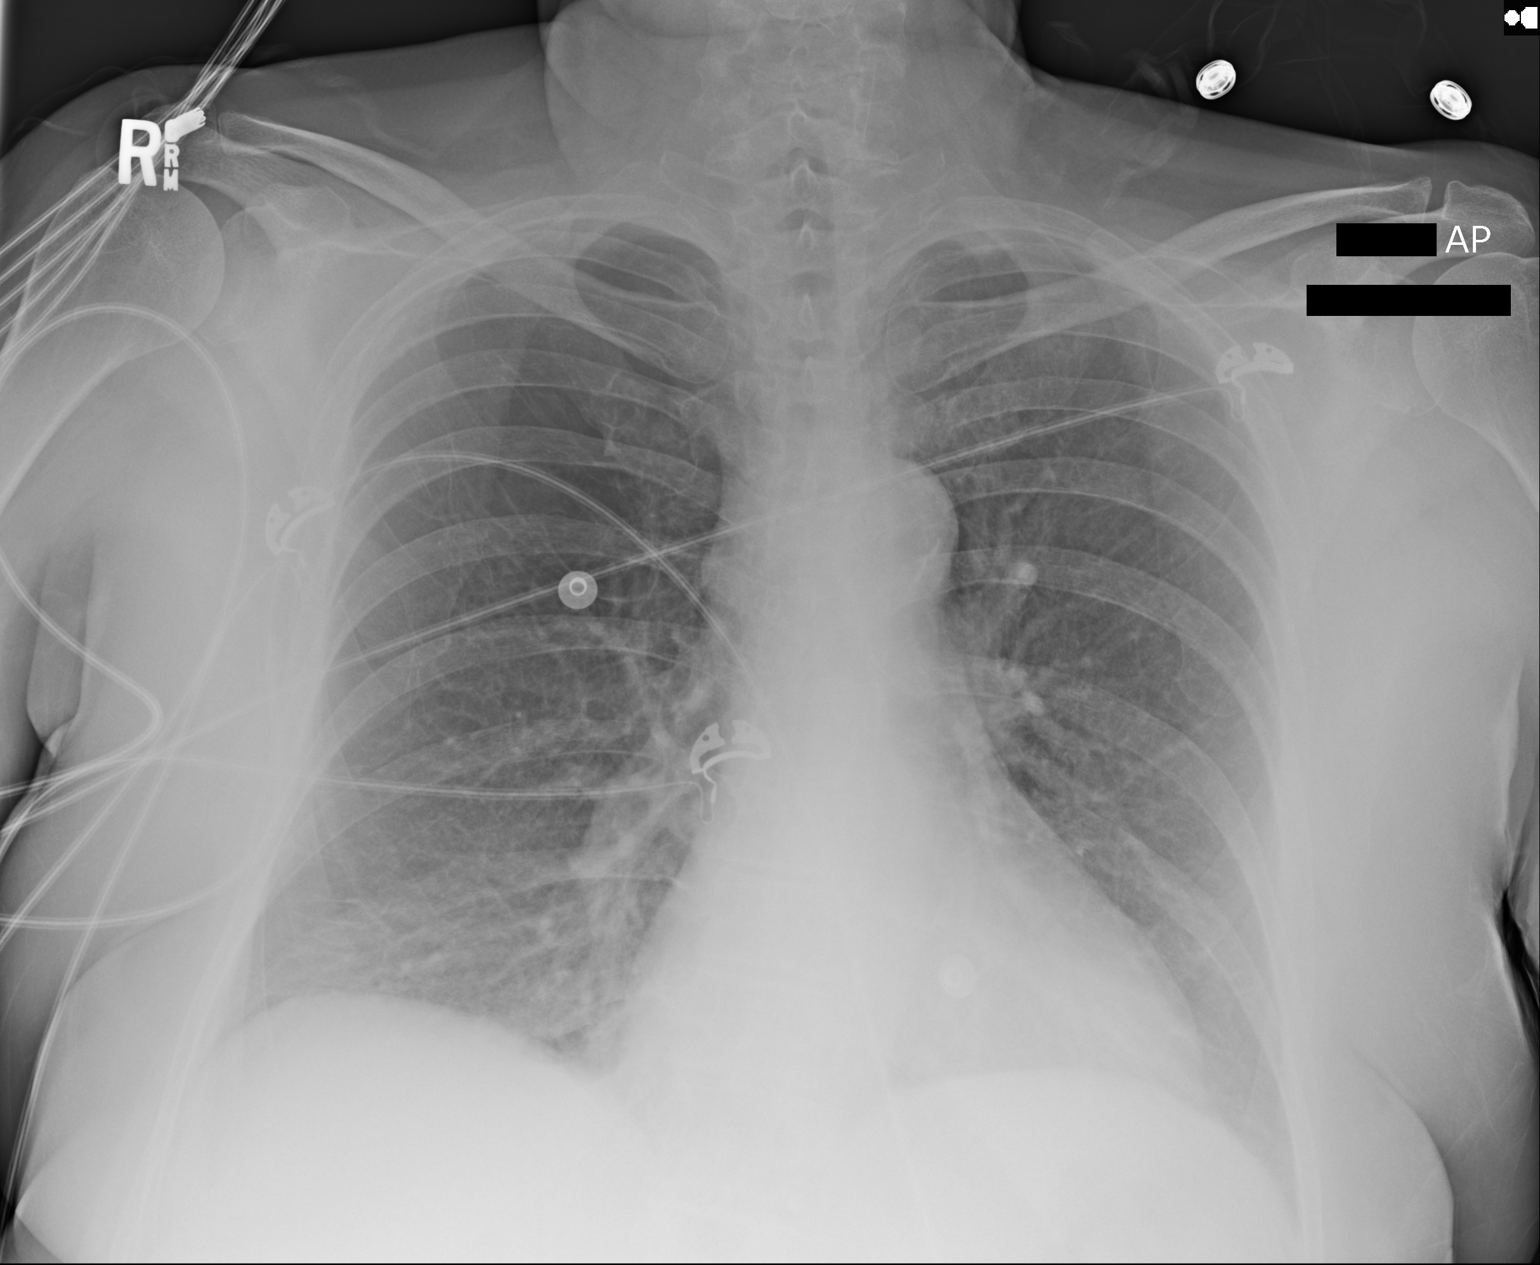

[1 of 1 positions shown; findings below may reference images not displayed]

FINDINGS: Cardiomediastinal silhouette appears stable. No pneumothorax is
noted. Emphysematous disease is noted in the right upper lobe which
is stable. No acute pulmonary disease is noted. No significant
pleural effusion is noted.
IMPRESSION: No acute cardiopulmonary abnormality seen.

## 2016-07-01 ENCOUNTER — Ambulatory Visit
Admission: RE | Admit: 2016-07-01 | Discharge: 2016-07-01 | Disposition: A | Discharge status: Home / Self Care | Payer: Medicare Other | Source: Ambulatory Visit | Attending: Radiation Oncology | Admitting: Radiation Oncology

## 2016-07-01 ENCOUNTER — Encounter: Payer: Self-pay | Admitting: Radiation Oncology

## 2016-07-01 ENCOUNTER — Ambulatory Visit: Payer: Medicare Other | Admitting: Nutrition

## 2016-07-01 VITALS — BP 109/68 | HR 79 | Temp 97.8°F | Ht 65.0 in | Wt 125.6 lb

## 2016-07-01 DIAGNOSIS — F329 Major depressive disorder, single episode, unspecified: Secondary | ICD-10-CM | POA: Diagnosis not present

## 2016-07-01 DIAGNOSIS — Z510 Encounter for antineoplastic radiation therapy: Secondary | ICD-10-CM | POA: Diagnosis not present

## 2016-07-01 DIAGNOSIS — C771 Secondary and unspecified malignant neoplasm of intrathoracic lymph nodes: Secondary | ICD-10-CM | POA: Diagnosis not present

## 2016-07-01 DIAGNOSIS — F419 Anxiety disorder, unspecified: Secondary | ICD-10-CM | POA: Diagnosis not present

## 2016-07-01 DIAGNOSIS — F4310 Post-traumatic stress disorder, unspecified: Secondary | ICD-10-CM | POA: Diagnosis not present

## 2016-07-01 NOTE — Progress Notes (Signed)
Nutrition follow-up completed with patient receiving treatment for lung cancer. Weight decreased and was documented as 125.6 pounds January 29, down from 127 pounds January 15. Patient is concerned with weight loss, however, states she is eating well. Patient reports she is drinking boost, which only has 4 g of sugar but does not drink them every day. Patient is very focused on sugar content of foods as well as sodium content and has been restricting foods that are higher than the limits she has set for herself.  Glucose was 97 on December 22.  Nutrition diagnosis: Unintentional weight loss continues.  Intervention: I again educated patient on the importance of increased calories and protein to promote weight gain/weight maintenance. Explained that patient should not have to monitor the sugar content of food. Reviewed importance of adding calories and protein in small frequent meals. Provided additional samples of ensure Enlive and encouraged patient to drink twice a day at mealtimes. Questions were answered.  Teach back method used.  Monitoring, evaluation, goals:  Patient will tolerate increased calories and protein to minimize weight loss.  Next visit: To be scheduled as needed.  **Disclaimer: This note was dictated with voice recognition software. Similar sounding words can inadvertently be transcribed and this note may contain transcription errors which may not have been corrected upon publication of note.**

## 2016-07-01 NOTE — Progress Notes (Signed)
Ms. Palladino presents for her 5th fraction of radiation to her Whole Brain. She reports a headache and vision changes that started occurring several weeks ago. She reports the vision problems seem to be getting progressively worse. She also reports bilateral eye pain that have been occurring over the past few weeks since she ran into a wall and broke a blood vessel in her eye at that time. She also is concerned about a stuffy nose she developed several days ago. She tried a Vicks vaporizer and this seemed to make it worse. She does have redness over her forehead. She reports increased fatigue since beginning radiation. She is trying to stay active, but gets tired easily. She is eating 3 meals daily. She will occasionally drink a Boost.   BP 109/68   Pulse 79   Temp 97.8 F (36.6 C)   Ht '5\' 5"'$  (1.651 m)   Wt 125 lb 9.6 oz (57 kg)   SpO2 100% Comment: room air  BMI 20.90 kg/m    Wt Readings from Last 3 Encounters:  07/01/16 125 lb 9.6 oz (57 kg)  06/17/16 127 lb (57.6 kg)  06/14/16 125 lb 12.8 oz (57.1 kg)

## 2016-07-01 NOTE — Progress Notes (Signed)
   Weekly Management Note:  Outpatient    ICD-9-CM ICD-10-CM   1. 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  CBCT/MVCT images/Port film x-rays were reviewed.  The chart was checked. She reports blurry vision, HAs, watery eyes, swelling around eyes, sinus drainage, and itchy scalp. And fatigue.  Trouble maintaining weight   Physical Findings:  height is '5\' 5"'$  (1.651 m) and weight is 125 lb 9.6 oz (57 kg). Her temperature is 97.8 F (36.6 C). Her blood pressure is 109/68 and her pulse is 79. Her oxygen saturation is 100%.   Wt Readings from Last 3 Encounters:  07/01/16 125 lb 9.6 oz (57 kg)  06/17/16 127 lb (57.6 kg)  06/14/16 125 lb 12.8 oz (57.1 kg)   Scalp - skin intact with tiny scabs Eyes - no obvious watering or irritation, no swelling Vision grossly intact  Impression:  The patient is tolerating radiotherapy.  Plan:  Continue radiotherapy as planned. Try hydrocortisone 1% cream prn itching scalp.  Recommended artificial tears for eyes.  Recommend PCP followup if sinus issues don't resolve in next couple of week.  Recommend seeing her eye doctor if vision remains blurred . nutritionist appt today ________________________________   [name], M.D.

## 2016-07-02 ENCOUNTER — Ambulatory Visit
Admission: RE | Admit: 2016-07-02 | Discharge: 2016-07-02 | Disposition: A | Discharge status: Home / Self Care | Payer: Medicare Other | Source: Ambulatory Visit | Attending: Radiation Oncology | Admitting: Radiation Oncology

## 2016-07-02 DIAGNOSIS — C771 Secondary and unspecified malignant neoplasm of intrathoracic lymph nodes: Secondary | ICD-10-CM | POA: Diagnosis not present

## 2016-07-02 DIAGNOSIS — F419 Anxiety disorder, unspecified: Secondary | ICD-10-CM | POA: Diagnosis not present

## 2016-07-02 DIAGNOSIS — F329 Major depressive disorder, single episode, unspecified: Secondary | ICD-10-CM | POA: Diagnosis not present

## 2016-07-02 DIAGNOSIS — Z510 Encounter for antineoplastic radiation therapy: Secondary | ICD-10-CM | POA: Diagnosis not present

## 2016-07-02 DIAGNOSIS — F4310 Post-traumatic stress disorder, unspecified: Secondary | ICD-10-CM | POA: Diagnosis not present

## 2016-07-03 ENCOUNTER — Ambulatory Visit
Admission: RE | Admit: 2016-07-03 | Discharge: 2016-07-03 | Disposition: A | Discharge status: Home / Self Care | Payer: Medicare Other | Source: Ambulatory Visit | Attending: Radiation Oncology | Admitting: Radiation Oncology

## 2016-07-03 DIAGNOSIS — C771 Secondary and unspecified malignant neoplasm of intrathoracic lymph nodes: Secondary | ICD-10-CM | POA: Diagnosis not present

## 2016-07-03 DIAGNOSIS — F419 Anxiety disorder, unspecified: Secondary | ICD-10-CM | POA: Diagnosis not present

## 2016-07-03 DIAGNOSIS — F4310 Post-traumatic stress disorder, unspecified: Secondary | ICD-10-CM | POA: Diagnosis not present

## 2016-07-03 DIAGNOSIS — Z510 Encounter for antineoplastic radiation therapy: Secondary | ICD-10-CM | POA: Diagnosis not present

## 2016-07-03 DIAGNOSIS — F329 Major depressive disorder, single episode, unspecified: Secondary | ICD-10-CM | POA: Diagnosis not present

## 2016-07-04 ENCOUNTER — Ambulatory Visit
Admission: RE | Admit: 2016-07-04 | Discharge: 2016-07-04 | Disposition: A | Discharge status: Home / Self Care | Payer: Medicare Other | Source: Ambulatory Visit | Attending: Radiation Oncology | Admitting: Radiation Oncology

## 2016-07-04 DIAGNOSIS — F4310 Post-traumatic stress disorder, unspecified: Secondary | ICD-10-CM | POA: Diagnosis not present

## 2016-07-04 DIAGNOSIS — F419 Anxiety disorder, unspecified: Secondary | ICD-10-CM | POA: Diagnosis not present

## 2016-07-04 DIAGNOSIS — C771 Secondary and unspecified malignant neoplasm of intrathoracic lymph nodes: Secondary | ICD-10-CM | POA: Diagnosis not present

## 2016-07-04 DIAGNOSIS — F329 Major depressive disorder, single episode, unspecified: Secondary | ICD-10-CM | POA: Diagnosis not present

## 2016-07-04 DIAGNOSIS — Z510 Encounter for antineoplastic radiation therapy: Secondary | ICD-10-CM | POA: Diagnosis not present

## 2016-07-05 ENCOUNTER — Ambulatory Visit
Admission: RE | Admit: 2016-07-05 | Discharge: 2016-07-05 | Disposition: A | Discharge status: Home / Self Care | Payer: Medicare Other | Source: Ambulatory Visit | Attending: Radiation Oncology | Admitting: Radiation Oncology

## 2016-07-05 DIAGNOSIS — F329 Major depressive disorder, single episode, unspecified: Secondary | ICD-10-CM | POA: Diagnosis not present

## 2016-07-05 DIAGNOSIS — C771 Secondary and unspecified malignant neoplasm of intrathoracic lymph nodes: Secondary | ICD-10-CM | POA: Diagnosis not present

## 2016-07-05 DIAGNOSIS — F419 Anxiety disorder, unspecified: Secondary | ICD-10-CM | POA: Diagnosis not present

## 2016-07-05 DIAGNOSIS — Z510 Encounter for antineoplastic radiation therapy: Secondary | ICD-10-CM | POA: Diagnosis not present

## 2016-07-05 DIAGNOSIS — F4310 Post-traumatic stress disorder, unspecified: Secondary | ICD-10-CM | POA: Diagnosis not present

## 2016-07-07 ENCOUNTER — Other Ambulatory Visit: Payer: Self-pay | Admitting: Cardiology

## 2016-07-08 ENCOUNTER — Ambulatory Visit
Admission: RE | Admit: 2016-07-08 | Discharge: 2016-07-08 | Disposition: A | Discharge status: Home / Self Care | Payer: Medicare Other | Source: Ambulatory Visit | Attending: Radiation Oncology | Admitting: Radiation Oncology

## 2016-07-08 ENCOUNTER — Encounter: Payer: Self-pay | Admitting: Radiation Oncology

## 2016-07-08 VITALS — BP 131/67 | HR 75 | Temp 97.8°F | Ht 65.0 in | Wt 128.0 lb

## 2016-07-08 DIAGNOSIS — C771 Secondary and unspecified malignant neoplasm of intrathoracic lymph nodes: Secondary | ICD-10-CM

## 2016-07-08 DIAGNOSIS — F329 Major depressive disorder, single episode, unspecified: Secondary | ICD-10-CM

## 2016-07-08 DIAGNOSIS — F419 Anxiety disorder, unspecified: Secondary | ICD-10-CM | POA: Insufficient documentation

## 2016-07-08 DIAGNOSIS — Z923 Personal history of irradiation: Secondary | ICD-10-CM | POA: Insufficient documentation

## 2016-07-08 DIAGNOSIS — Z510 Encounter for antineoplastic radiation therapy: Secondary | ICD-10-CM | POA: Diagnosis not present

## 2016-07-08 DIAGNOSIS — F4310 Post-traumatic stress disorder, unspecified: Secondary | ICD-10-CM | POA: Diagnosis not present

## 2016-07-08 MED ORDER — BIAFINE EX EMUL
Freq: Once | CUTANEOUS | Status: AC
  Administered 2016-07-08: 12:00:00 via TOPICAL

## 2016-07-08 NOTE — Progress Notes (Signed)
Michele [name] has completed 10 fractions to her whole brain.  She continues to report having pain in her right neck, her throat and under her rib cage.  She also reports coughing up lots of white sputum that she said is worse after drinking milk.  She also thinks she has thrush.  She does not have a white coating on her tongue and is not taking decadron.  She is wanting a prescription for magic mouthwash and caniibis oil. She reports having daily headaches that has started after radiation.  She reports having blurry vision and said her right eye hurts sometimes.  The skin on her forehead is red.  Her ears also appear to be pink.  She has been given biafine and has been instructed on how to use it.  She has been a one month follow up appointment.  BP 131/67 (BP Location: Left Wrist, Patient Position: Sitting)   Pulse 75   Temp 97.8 F (36.6 C) (Oral)   Ht '5\' 5"'$  (1.651 m)   Wt 128 lb (58.1 kg)   SpO2 100%   BMI 21.30 kg/m    Wt Readings from Last 3 Encounters:  07/08/16 128 lb (58.1 kg)  07/01/16 125 lb 9.6 oz (57 kg)  06/17/16 127 lb (57.6 kg)

## 2016-07-09 ENCOUNTER — Other Ambulatory Visit: Payer: Self-pay | Admitting: Radiation Oncology

## 2016-07-09 DIAGNOSIS — C771 Secondary and unspecified malignant neoplasm of intrathoracic lymph nodes: Secondary | ICD-10-CM

## 2016-07-09 NOTE — Progress Notes (Addendum)
   Weekly Management Note:  Outpatient    ICD-9-CM ICD-10-CM   1. 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  CBCT/MVCT images/Port film x-rays were reviewed.  The chart was checked. She is tearful, anxious.    Physical Findings:  height is '5\' 5"'$  (1.651 m) and weight is 128 lb (58.1 kg). Her oral temperature is 97.8 F (36.6 C). Her blood pressure is 131/67 and her pulse is 75. Her oxygen saturation is 100%.   Wt Readings from Last 3 Encounters:  07/08/16 128 lb (58.1 kg)  07/01/16 125 lb 9.6 oz (57 kg)  06/17/16 127 lb (57.6 kg)   Scalp - skin erythematous but intac  Eyes -  no swelling Oral cavity - no thrush Neck - no masses  Impression:  The patient tolerated  radiotherapy.  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  She agreed to meet with social work and engage in counseling.  Husband needs support and counseling too.  Pt may need encouragement to follow  through. I have asked Social Work to please set up in-person meeting. I will see her gain in 1 mo.  I let her know I do not Rx Cannabis Oil.  We discussed her symptoms in detail and discussed followup scans for surveillance.  CT body will be ordered by med/onc and I will order MRI brain q 63mo  [facility] given and discussed.  ________________________________   SEppie Gibson M.D.

## 2016-07-10 ENCOUNTER — Telehealth: Payer: Self-pay

## 2016-07-10 ENCOUNTER — Other Ambulatory Visit: Payer: Self-pay | Admitting: Family Medicine

## 2016-07-10 ENCOUNTER — Ambulatory Visit
Admission: RE | Admit: 2016-07-10 | Discharge: 2016-07-10 | Disposition: A | Discharge status: Home / Self Care | Payer: Medicare Other | Source: Ambulatory Visit | Attending: Family Medicine | Admitting: Family Medicine

## 2016-07-10 ENCOUNTER — Encounter: Payer: Self-pay | Admitting: Radiation Oncology

## 2016-07-10 DIAGNOSIS — Z1231 Encounter for screening mammogram for malignant neoplasm of breast: Secondary | ICD-10-CM

## 2016-07-10 DIAGNOSIS — R928 Other abnormal and inconclusive findings on diagnostic imaging of breast: Secondary | ICD-10-CM

## 2016-07-10 NOTE — Progress Notes (Signed)
  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  Beams/energy:   Isodose Plan  //  6X  Narrative: The patient tolerated radiation treatment relatively well. She experienced fatigue throughout treatment. She also reported blurry vision, watery eyes, and sinus drainage towards the end of treatment.  Plan: The patient has completed radiation treatment. The patient has post traumatic stress like symptoms from diagnosis, anxiety, and depression. She agreed to meet with social work and engage in counseling. The patient will return to radiation oncology clinic for routine followup in one month. I advised them to call or return sooner if they have any questions or concerns related to their recovery or treatment.  -----------------------------------  [name], MD  This document serves as a record of services personally performed by [name], MD. It was created on her behalf by Maryla Morrow, a trained medical scribe. The creation of this record is based on the scribe's personal observations and the provider's statements to them. This document has been checked and approved by the attending provider.

## 2016-07-10 NOTE — Telephone Encounter (Signed)
Ms. Backs called and left a voicemail today. I called her back and she reports intense itching to her scalp last night. She also states she has been using the biafine lotion provided to her on her last radiation treamtent day, but feels like the redness to her forehead has not improved. She is also concerned about swelling to her eyes which she has been experiencing throughout radiation. I let her know that it would take longer for the redness to improve, and that she should continue to use the biafine to help moisturize her skin. I also advised her to try to use the biafine to her scalp to help with the itching. She could also try hydrocortisone cream to the area that is itching. I advised that she should give herself some more time to recover from her radiation treatments. She is aware to call me back if she has any more questions or concerns.

## 2016-07-11 ENCOUNTER — Ambulatory Visit (HOSPITAL_COMMUNITY)
Admission: RE | Admit: 2016-07-11 | Discharge: 2016-07-11 | Disposition: A | Discharge status: Home / Self Care | Payer: Medicare Other | Source: Ambulatory Visit | Attending: Obstetrics & Gynecology | Admitting: Obstetrics & Gynecology

## 2016-07-11 DIAGNOSIS — C771 Secondary and unspecified malignant neoplasm of intrathoracic lymph nodes: Secondary | ICD-10-CM | POA: Diagnosis not present

## 2016-07-11 DIAGNOSIS — N950 Postmenopausal bleeding: Secondary | ICD-10-CM

## 2016-07-11 DIAGNOSIS — F329 Major depressive disorder, single episode, unspecified: Secondary | ICD-10-CM | POA: Diagnosis not present

## 2016-07-11 DIAGNOSIS — F419 Anxiety disorder, unspecified: Secondary | ICD-10-CM | POA: Diagnosis not present

## 2016-07-11 DIAGNOSIS — F4310 Post-traumatic stress disorder, unspecified: Secondary | ICD-10-CM | POA: Diagnosis not present

## 2016-07-11 DIAGNOSIS — Z510 Encounter for antineoplastic radiation therapy: Secondary | ICD-10-CM | POA: Diagnosis not present

## 2016-07-11 NOTE — Progress Notes (Signed)
[facility] Counseling Note  Called pt per Lauren Mullis/LCSW to set up counseling appointment. Pt requested couple's counseling and to bring boyfriend. First couple's session scheduled for 2/16 at 11:30a.  Michele [name], [place] [facility] [place] Voicemail: [phone]

## 2016-07-15 ENCOUNTER — Telehealth: Payer: Self-pay | Admitting: General Practice

## 2016-07-15 NOTE — Telephone Encounter (Signed)
Patient called and left message requesting ultrasound results.

## 2016-07-15 NOTE — Telephone Encounter (Signed)
Called pt and informed her of normal pelvic US.  Pt expressed extreme anxiety due to her recent cancer Dx and care. She was particularly worried about her ovaries. I advised normal ovaries and no masses seen. Pt expressed gratitude and voiced understanding.

## 2016-07-16 ENCOUNTER — Ambulatory Visit
Admission: RE | Admit: 2016-07-16 | Discharge: 2016-07-16 | Disposition: A | Discharge status: Home / Self Care | Payer: Medicare Other | Source: Ambulatory Visit | Attending: Family Medicine | Admitting: Family Medicine

## 2016-07-16 DIAGNOSIS — N6322 Unspecified lump in the left breast, upper inner quadrant: Secondary | ICD-10-CM | POA: Diagnosis not present

## 2016-07-16 DIAGNOSIS — R928 Other abnormal and inconclusive findings on diagnostic imaging of breast: Secondary | ICD-10-CM

## 2016-07-16 DIAGNOSIS — N6321 Unspecified lump in the left breast, upper outer quadrant: Secondary | ICD-10-CM | POA: Diagnosis not present

## 2016-07-18 DIAGNOSIS — R2689 Other abnormalities of gait and mobility: Secondary | ICD-10-CM | POA: Diagnosis not present

## 2016-07-18 DIAGNOSIS — Z923 Personal history of irradiation: Secondary | ICD-10-CM | POA: Diagnosis not present

## 2016-07-18 DIAGNOSIS — H9313 Tinnitus, bilateral: Secondary | ICD-10-CM | POA: Diagnosis not present

## 2016-07-19 NOTE — Progress Notes (Signed)
[facility] Counseling Note  Pt cancelled individual counseling appointment for 2/16 and rescheduled for 2/22 at [facility].  Ayesha Rumpf, [place] [facility] [phone]

## 2016-07-22 ENCOUNTER — Other Ambulatory Visit: Payer: Self-pay

## 2016-07-22 MED ORDER — CARVEDILOL 6.25 MG PO TABS: 6.2500 mg | ORAL_TABLET | Freq: Two times a day (BID) | ORAL | 3 refills | Status: DC

## 2016-07-23 ENCOUNTER — Other Ambulatory Visit: Payer: Self-pay | Admitting: Cardiology

## 2016-07-23 ENCOUNTER — Other Ambulatory Visit: Payer: Self-pay | Admitting: *Deleted

## 2016-07-25 ENCOUNTER — Ambulatory Visit: Payer: Medicare Other | Admitting: Nutrition

## 2016-07-25 NOTE — Progress Notes (Signed)
Patient continues to have questions regarding oral intake. Expresses concern over continuing weight loss. She is continuing to restrict her diet unnecessarily. She is requesting ensure Enlive samples. I educated patient on strategies for increasing calories and protein throughout the day. Provided samples of ensure Enlive Encouraged patient to increase oral intake and not restrict diet.  **Disclaimer: This note was dictated with voice recognition software. Similar sounding words can inadvertently be transcribed and this note may contain transcription errors which may not have been corrected upon publication of note.**

## 2016-07-25 NOTE — Progress Notes (Signed)
[facility] Counseling Note  First couples session with pt and her boyfriend. Counselor had scheduled an intake session with the couple, but pt, pt's partner, and counselor instead spent appointment time discussing what counseling is and how it could be helpful for the couple. Counselor decided to delay intake paperwork to attend to the couple's initial need to process how the pt's cancer is affecting the couple and how counseling could help support the couple. The couple decided to schedule next session (intake) for 3/1 at 10a.   Counselor called pt to follow up on any safety concerns as counselor did not get to ask about SI/safety during today's session. Counselor did/does not have any concerns about the clients' safety, but wished/s to follow personal and professional procedure. Pt reported no current SI and seemed to appreciate the check-in. Pt reported looking forward to the intake session.  Ayesha Rumpf, [place] [facility] [phone]

## 2016-07-29 ENCOUNTER — Telehealth: Payer: Self-pay | Admitting: Cardiology

## 2016-07-29 NOTE — Telephone Encounter (Signed)
Spoke to patient   informed patient that she is only taking carvedilol 6.25 mg twice a day at present time. Office did not call prescription for metoprolol. It must be a automatic refill for metoprolol. Metoprolol was placed on hold 12/ 13/18 at office visit . Informed patient not to pick up medication and let pharmacy  Know to take off automatic refill.  ppatient verbalized understanding .  patient also states blood pressure has been ranging 120/80 and wanted to know if this was to hight . RN informed patient that the reading was excellent reading for patient. Patient verbalized understanding.

## 2016-07-29 NOTE — Telephone Encounter (Signed)
Michele [name] is calling because Dr. [name] took her off Metoprolol and the pharmacist called and said that a prescription was ready for her to pick up . She is wanting to speak to someone about that , due to that she was taken off . Please call   Thanks

## 2016-08-07 ENCOUNTER — Other Ambulatory Visit: Payer: Self-pay | Admitting: Cardiology

## 2016-08-09 ENCOUNTER — Ambulatory Visit
Admission: RE | Admit: 2016-08-09 | Discharge: 2016-08-09 | Disposition: A | Discharge status: Home / Self Care | Payer: Medicare Other | Source: Ambulatory Visit | Attending: Radiation Oncology | Admitting: Radiation Oncology

## 2016-08-09 ENCOUNTER — Encounter: Payer: Self-pay | Admitting: Radiation Oncology

## 2016-08-09 VITALS — BP 103/57 | HR 80 | Temp 98.1°F | Ht 65.0 in | Wt 126.2 lb

## 2016-08-09 DIAGNOSIS — Z886 Allergy status to analgesic agent status: Secondary | ICD-10-CM | POA: Insufficient documentation

## 2016-08-09 DIAGNOSIS — Z888 Allergy status to other drugs, medicaments and biological substances status: Secondary | ICD-10-CM | POA: Diagnosis not present

## 2016-08-09 DIAGNOSIS — Z79899 Other long term (current) drug therapy: Secondary | ICD-10-CM | POA: Insufficient documentation

## 2016-08-09 DIAGNOSIS — C771 Secondary and unspecified malignant neoplasm of intrathoracic lymph nodes: Secondary | ICD-10-CM

## 2016-08-09 DIAGNOSIS — R5383 Other fatigue: Secondary | ICD-10-CM | POA: Diagnosis not present

## 2016-08-09 DIAGNOSIS — H9313 Tinnitus, bilateral: Secondary | ICD-10-CM | POA: Diagnosis not present

## 2016-08-09 DIAGNOSIS — C3490 Malignant neoplasm of unspecified part of unspecified bronchus or lung: Secondary | ICD-10-CM | POA: Diagnosis not present

## 2016-08-09 DIAGNOSIS — R5381 Other malaise: Secondary | ICD-10-CM | POA: Insufficient documentation

## 2016-08-09 DIAGNOSIS — R5382 Chronic fatigue, unspecified: Secondary | ICD-10-CM | POA: Insufficient documentation

## 2016-08-09 DIAGNOSIS — R531 Weakness: Secondary | ICD-10-CM

## 2016-08-09 LAB — CBC WITH DIFFERENTIAL/PLATELET
BASO%: 0.3 % (ref 0.0–2.0)
Basophils Absolute: 0 10*3/uL (ref 0.0–0.1)
EOS%: 0.8 % (ref 0.0–7.0)
Eosinophils Absolute: 0 10*3/uL (ref 0.0–0.5)
HCT: 41.5 % (ref 34.8–46.6)
HGB: 13.9 g/dL (ref 11.6–15.9)
LYMPH%: 8.9 % — ABNORMAL LOW (ref 14.0–49.7)
MCH: 31.3 pg (ref 25.1–34.0)
MCHC: 33.4 g/dL (ref 31.5–36.0)
MCV: 93.8 fL (ref 79.5–101.0)
MONO#: 0.3 10*3/uL (ref 0.1–0.9)
MONO%: 6 % (ref 0.0–14.0)
NEUT#: 4 10*3/uL (ref 1.5–6.5)
NEUT%: 84 % — ABNORMAL HIGH (ref 38.4–76.8)
Platelets: 146 10*3/uL (ref 145–400)
RBC: 4.43 10*6/uL (ref 3.70–5.45)
RDW: 13.5 % (ref 11.2–14.5)
WBC: 4.7 10*3/uL (ref 3.9–10.3)
lymph#: 0.4 10*3/uL — ABNORMAL LOW (ref 0.9–3.3)

## 2016-08-09 LAB — TSH: TSH: 1.316 m(IU)/L (ref 0.308–3.960)

## 2016-08-09 LAB — COMPREHENSIVE METABOLIC PANEL
ALT: 10 U/L (ref 0–55)
AST: 25 U/L (ref 5–34)
Albumin: 4.1 g/dL (ref 3.5–5.0)
Alkaline Phosphatase: 64 U/L (ref 40–150)
Anion Gap: 9 mEq/L (ref 3–11)
BUN: 14.6 mg/dL (ref 7.0–26.0)
CO2: 28 mEq/L (ref 22–29)
Calcium: 9.7 mg/dL (ref 8.4–10.4)
Chloride: 107 mEq/L (ref 98–109)
Creatinine: 0.7 mg/dL (ref 0.6–1.1)
EGFR: >90 mL/min/{1.73_m2} (ref >90)
Glucose: 129 mg/dl (ref 70–140)
Potassium: 4.1 mEq/L (ref 3.5–5.1)
Sodium: 144 mEq/L (ref 136–145)
Total Bilirubin: 0.36 mg/dL (ref 0.20–1.20)
Total Protein: 6.6 g/dL (ref 6.4–8.3)

## 2016-08-09 NOTE — Progress Notes (Signed)
[facility] Counseling Note  Second individual session with pt. Completed intake paperwork. No reports of SI or hx of substance abuse. Pt presented with flat affect and little motivation; she seems to be in a pre-contemplation stage of change. Pt stated, "I'll feel better [emotionally] when all the side effects are gone."   Counselor attempted to externalize cancer and use Motivational Interviewing techniques to help pt gain a sense of control over her life and decisions. Pt denied cancer's power/control over aspects of her life. When asked how the client wants to use counseling sessions, pt reported that she did not know.  Client seems to be struggling with anxiety, depression, and deep seated fears surrounding cancer. These fears are most likely impacting her presentation in session. Client could benefit from interventions surrounding self confidence and reflection on the challenges client has overcome.   Client scheduled another counseling session for 3/16 at [facility].  Westly Pam, [place] [facility] [place]

## 2016-08-09 NOTE — Progress Notes (Signed)
Ms. Muro presents for follow up of radiation completed 07/08/16 to her Whole Brain and 05/06/16 to her Mediastinum. She reports a generalized pain 10/10. She is unable to give me a specific area that hurts. She reports muscle aches to her arms and legs and tingling. She has very little strength and feels very weak constantly. She does report a headache to the Right side of her head at times. She has continued "buzzing" to her bilateral ears. This started in her Right Ear and has moved to her Left ear recently. She did see an Ear doctor about the Right Ear buzzing and they felt like it might be tininitis. She tells me she constantly has difficulty breathing at rest and with activity. She had a head cold recently and is not sure if her breathing difficulty is because of this. In the morning she has "a mouth full of white stuff" and needs to constantly spit during the day. She is also coughing up white stuff during the day. She has short term memory difficulties. She reports dry skin and lips. She is eating ok, but not gaining weight. She is concerned about dehydration, and tells me that her urine is darker yellow colored. She is scheduled to see Dr. Earlie Server next on 08/28/16 to receive results of her CT Chest from 08/26/16.  BP (!) 103/57   Pulse 80   Temp 98.1 F (36.7 C)   Ht '5\' 5"'$  (1.651 m)   Wt 126 lb 3.2 oz (57.2 kg)   SpO2 100% Comment: room air  BMI 21.00 kg/m    Wt Readings from Last 3 Encounters:  08/09/16 126 lb 3.2 oz (57.2 kg)  07/08/16 128 lb (58.1 kg)  07/01/16 125 lb 9.6 oz (57 kg)

## 2016-08-09 NOTE — Progress Notes (Signed)
Radiation Oncology         [phone] ________________________________  Name: Michele [name] MRN: [id number]  Date: 08/09/2016  DOB: 1956-05-02  Follow-Up Visit Note  Outpatient  CC: [name], MD  Curt Bears, MD  Diagnosis and Prior Radiotherapy:    ICD-9-CM ICD-10-CM   1. Secondary malignancy of mediastinal lymph nodes (HCC) 196.1 C77.1 CBC with Differential     Comprehensive metabolic panel     TSH  2. Chronic fatigue and malaise 780.71 R53.82     R53.81   3. Malaise and fatigue 780.79 R53.81 CBC with Differential    R53.83 Comprehensive metabolic panel     TSH  4. 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  06/25/16-07/08/16 25 Gy to the whole brain in 10 fractions  CHIEF COMPLAINT: Here for follow-up and surveillance of small cell lung cancer  Narrative:  The patient returns today for routine follow-up. She reports 10/10 generalized pain. She notes this is uncharacteristic. She reports tingling and muscle aches to her arms and legs. She reports weakness and has very little strength. She reports occasional headaches to the right side of her head. She reports continued "buzzing" in bilateral ears. She was seen by an ENT who suggested this may be tinnitus. She reports difficulty breathing at rest and with activity. She notes this may be related to a recent cold. She reports "a mouth full of white stuff" in the morning and needing to spit during the day. She reports a productive cough with white sputum. She notes short term memory difficulty. She complains of dry skin and lips. She reports an adequate diet without weight changes. She is concerned she is dehydrated due to dark urine but drinks 64 ounces a day. She is scheduled to see Dr. Earlie Server  on 08/28/16 for chest CT results from 08/26/16.                       ALLERGIES:  is allergic to ciprofloxacin; aspirin; crestor [rosuvastatin]; ibuprofen; wellbutrin [bupropion]; lipitor [atorvastatin]; and sulfonamide derivatives.  Meds: Current Outpatient Prescriptions  Medication Sig Dispense Refill  . Calcium Carbonate-Vitamin D (CALCIUM 600+D PO) Take 1 tablet by mouth daily.     . carvedilol (COREG) 6.25 MG tablet Take 1 tablet (6.25 mg total) by mouth 2 (two) times daily with a meal. 180 tablet 3  . clonazePAM (KLONOPIN) 2 MG tablet Take 2 mg by mouth 3 (three) times daily as needed for anxiety.     . clopidogrel (PLAVIX) 75 MG tablet TAKE 1 TABLET(75 MG) BY MOUTH DAILY 30 tablet 0  . Coenzyme Q10 (COQ10) 100 MG CAPS Take 100 mg by mouth daily. Reported on 10/16/2015    . isosorbide mononitrate (IMDUR) 30 MG 24 hr tablet TAKE 1 TABLET(30 MG) BY MOUTH AT BEDTIME 90 tablet 1  . nitroGLYCERIN (NITROSTAT) 0.4 MG SL tablet DISSOLVE 1 TABLET UNDER THE TONGUE EVERY 5 MINUTES AS NEEDED FOR CHEST PAIN 25 tablet 2  . pantoprazole (PROTONIX) 40 MG tablet Take 1 tablet (40 mg total) by mouth daily. 30 tablet 10  . polyethylene glycol (MIRALAX / GLYCOLAX) packet Take 17 g by mouth daily. 14 each 0  . pravastatin (PRAVACHOL) 40 MG tablet TAKE 1 TABLET(40 MG) BY MOUTH  EVERY EVENING 90 tablet 2  . sennosides-docusate sodium (SENOKOT-S) 8.6-50 MG tablet Take 1 tablet by mouth 2 (two) times daily.    [name] tiotropium (SPIRIVA) 18 MCG inhalation capsule Place 18 mcg into inhaler and inhale daily.    [name] VASCEPA 1 g CAPS TAKE 1 CAPSULES BY MOUTH TWICE DAILY 60 capsule 9  . VENTOLIN HFA 108 (90 Base) MCG/ACT inhaler     . VIRTUSSIN A/C 100-10 MG/5ML syrup TAKE 5 MLS PO TID PRN FOR COUGH  0   No current facility-administered medications for this encounter.    Facility-Administered Medications Ordered in Other Encounters  Medication Dose Route Frequency Provider Last Rate Last Dose  . HYDROcodone-acetaminophen  (NORCO/VICODIN) 5-325 MG per tablet 1 tablet  1 tablet Oral Once Susanne Borders, NP        Physical Findings: The patient is in no acute distress. Patient is alert and oriented.  height is '5\' 5"'$  (1.651 m) and weight is 126 lb 3.2 oz (57.2 kg). Her temperature is 98.1 F (36.7 C). Her blood pressure is 103/57 (abnormal) and her pulse is 80. Her oxygen saturation is 100%.  General: Alert and oriented, in no acute distress HEENT: Mucous membranes are moist, no evidence of thrush. Cerumen noted in bilateral ear canals, difficult to see either tympanic membranes. Neck: Neck is supple, no palpable cervical or supraclavicular lymphadenopathy. Heart: Regular in rate and rhythm with no murmurs, rubs, or gallops. Chest: Clear to auscultation bilaterally, with no rhonchi, wheezes, or rales. Psych: Flat affect   Lab Findings: Lab Results  Component Value Date   WBC 4.7 08/09/2016   HGB 13.9 08/09/2016   HCT 41.5 08/09/2016   MCV 93.8 08/09/2016   PLT 146 08/09/2016    Lab Results  Component Value Date   TSH 1.316 31/49/7026    basic metabolic panel CMP     Component Value Date/Time   NA 144 08/09/2016 1145   K 4.1 08/09/2016 1145   CL 107 05/24/2016 1922   CO2 28 08/09/2016 1145   GLUCOSE 129 08/09/2016 1145   BUN 14.6 08/09/2016 1145   CREATININE 0.7 08/09/2016 1145   CALCIUM 9.7 08/09/2016 1145   PROT 6.6 08/09/2016 1145   ALBUMIN 4.1 08/09/2016 1145   AST 25 08/09/2016 1145   ALT 10 08/09/2016 1145   ALKPHOS 64 08/09/2016 1145   BILITOT 0.36 08/09/2016 1145   GFRNONAA >60 05/24/2016 1922   GFRAA >60 05/24/2016 1922    Radiographic Findings: US Transvaginal Non-ob  Result Date: 07/11/2016 CLINICAL DATA:  Postmenopausal bleeding. EXAM: TRANSABDOMINAL AND TRANSVAGINAL ULTRASOUND OF PELVIS TECHNIQUE: Both transabdominal and transvaginal ultrasound examinations of the pelvis were performed. Transabdominal technique was performed for global imaging of the pelvis including  uterus, ovaries, adnexal regions, and pelvic cul-de-sac. It was necessary to proceed with endovaginal exam following the transabdominal exam to visualize the endometrium and ovaries. COMPARISON:  CT of the abdomen and pelvis on 03/09/2016 FINDINGS: Uterus Measurements: 7.3 x 3.4 x 5.1 cm. Mildly heterogeneous without discrete mass. Endometrium Thickness: 2.9 mm.  Small amount of endometrial fluid. Right ovary Measurements: 1.7 x 1.0 x 1.4 cm. Normal appearance/no adnexal mass. Left ovary Measurements: 2.0 x 1.1 x 1.1 cm. Normal appearance/no adnexal mass. Other findings Trace amount of free pelvic fluid. IMPRESSION: 1. Normal appearance of the uterus and endometrium. In the setting of post-menopausal bleeding, this is consistent with a benign etiology such as endometrial atrophy. If bleeding remains unresponsive to hormonal or medical therapy, sonohysterogram should be considered  for focal lesion work-up. (Ref: Radiological Reasoning: Algorithmic Workup of Abnormal Vaginal Bleeding with Endovaginal Sonography and Sonohysterography. AJR 2008; 782:N56-21) 2. Normal appearance of both ovaries.  No adnexal mass. Electronically Signed   By: Nolon Nations M.D.   On: 07/11/2016 12:32   US Pelvis Complete  Result Date: 07/11/2016 CLINICAL DATA:  Postmenopausal bleeding. EXAM: TRANSABDOMINAL AND TRANSVAGINAL ULTRASOUND OF PELVIS TECHNIQUE: Both transabdominal and transvaginal ultrasound examinations of the pelvis were performed. Transabdominal technique was performed for global imaging of the pelvis including uterus, ovaries, adnexal regions, and pelvic cul-de-sac. It was necessary to proceed with endovaginal exam following the transabdominal exam to visualize the endometrium and ovaries. COMPARISON:  CT of the abdomen and pelvis on 03/09/2016 FINDINGS: Uterus Measurements: 7.3 x 3.4 x 5.1 cm. Mildly heterogeneous without discrete mass. Endometrium Thickness: 2.9 mm.  Small amount of endometrial fluid. Right ovary  Measurements: 1.7 x 1.0 x 1.4 cm. Normal appearance/no adnexal mass. Left ovary Measurements: 2.0 x 1.1 x 1.1 cm. Normal appearance/no adnexal mass. Other findings Trace amount of free pelvic fluid. IMPRESSION: 1. Normal appearance of the uterus and endometrium. In the setting of post-menopausal bleeding, this is consistent with a benign etiology such as endometrial atrophy. If bleeding remains unresponsive to hormonal or medical therapy, sonohysterogram should be considered for focal lesion work-up. (Ref: Radiological Reasoning: Algorithmic Workup of Abnormal Vaginal Bleeding with Endovaginal Sonography and Sonohysterography. AJR 2008; 308:M57-84) 2. Normal appearance of both ovaries.  No adnexal mass. Electronically Signed   By: Nolon Nations M.D.   On: 07/11/2016 12:32   US Breast Ltd Uni Left Inc Axilla  Result Date: 07/16/2016 CLINICAL DATA:  Screening recall for a possible left breast mass. EXAM: 2D DIGITAL DIAGNOSTIC UNILATERAL LEFT MAMMOGRAM WITH CAD AND ADJUNCT TOMO LEFT BREAST ULTRASOUND COMPARISON:  Previous exam(s). ACR Breast Density Category c: The breast tissue is heterogeneously dense, which may obscure small masses. FINDINGS: In the upper-outer quadrant of the left breast, posterior depth there is an oval circumscribed mass Mammographic images were processed with CAD. No palpable masses are identified on exam of the superior left breast. Ultrasound targeted to the left breast at 11 o'clock, 7 cm from the nipple demonstrates a circumscribed anechoic oval mass measuring 7 x 3 x 4 mm, compatible with a benign cyst. IMPRESSION: The mass in the superior left breast at 11 o'clock is consistent with a benign cyst. RECOMMENDATION: Screening mammogram in one year.(Code:SM-B-01Y) I have discussed the findings and recommendations with the patient. Results were also provided in writing at the conclusion of the visit. If applicable, a reminder letter will be sent to the patient regarding the next  appointment. BI-RADS CATEGORY  2: Benign. Electronically Signed   By: Ammie Ferrier M.D.   On: 07/16/2016 16:07   Mm Diag Breast Tomo Uni Left  Result Date: 07/16/2016 CLINICAL DATA:  Screening recall for a possible left breast mass. EXAM: 2D DIGITAL DIAGNOSTIC UNILATERAL LEFT MAMMOGRAM WITH CAD AND ADJUNCT TOMO LEFT BREAST ULTRASOUND COMPARISON:  Previous exam(s). ACR Breast Density Category c: The breast tissue is heterogeneously dense, which may obscure small masses. FINDINGS: In the upper-outer quadrant of the left breast, posterior depth there is an oval circumscribed mass Mammographic images were processed with CAD. No palpable masses are identified on exam of the superior left breast. Ultrasound targeted to the left breast at 11 o'clock, 7 cm from the nipple demonstrates a circumscribed anechoic oval mass measuring 7 x 3 x 4 mm, compatible with a benign cyst. IMPRESSION: The  mass in the superior left breast at 11 o'clock is consistent with a benign cyst. RECOMMENDATION: Screening mammogram in one year.(Code:SM-B-01Y) I have discussed the findings and recommendations with the patient. Results were also provided in writing at the conclusion of the visit. If applicable, a reminder letter will be sent to the patient regarding the next appointment. BI-RADS CATEGORY  2: Benign. Electronically Signed   By: Ammie Ferrier M.D.   On: 07/16/2016 16:07    Impression/Plan: 61 year old woman with limited stage IIIA T0N2M0 small cell lung cancer with secondary malignancy of mediastinal lymph nodes  1) Order lab work following today's appointment - as above, the labs did not explain her numerous symptoms  2) Follow up with radiation oncology 2 months following MRI of brain (the risk of brain metastases is unlikely in her first post-RT scan.  Previous MRIs have been clear despite neurologic complaints)  3) I offered a referral to physical therapy to increase muscle mass and address neurocognitive rehab.   She reluctantly agrees.  4) Recommended patient follow up with ENT for cerumen  and tinnitus.  5) I advised the patient to maintain her fluid intake. I recommended she continue to take Ensure to maintain her weight.   6) Patient will meet with a counselor immediately following today's appointment.  7) I will suggest to Dr. Julien Nordmann to consider bone scan for her pain, although she does not seem to be in 10/10 pain in concordance with her history.  I will also suggest he consider antidepressants if her outlook hasn't change after a few sessions of counseling.  I spent 30 minutes face to face with the patient and more than 50% of that time was spent in counseling and/or coordination of care. _____________________________________   [name], MD This document serves as a record of services personally performed by [name], MD. It was created on her behalf by Bethann Humble, a trained medical scribe. The creation of this record is based on the scribe's personal observations and the provider's statements to them. This document has been checked and approved by the attending provider.

## 2016-08-09 NOTE — Addendum Note (Signed)
Encounter addended by: [name], MD on: 08/09/2016  4:42 PM<BR>    Actions taken: LOS modified, Follow-up modified, Visit Navigator Flowsheet section accepted

## 2016-08-13 ENCOUNTER — Telehealth: Payer: Self-pay

## 2016-08-13 NOTE — Telephone Encounter (Signed)
I called Michele [name] to let her know that the recent lab work ordered by Dr. Isidore Moos was not concerning and did not explain her current symptoms. I encouraged her to attend PT, continue counseling, and follow up with Dr. Earlie Server as previously planned. She knows to call me if she has any further questions or concerns.

## 2016-08-14 DIAGNOSIS — H6123 Impacted cerumen, bilateral: Secondary | ICD-10-CM | POA: Diagnosis not present

## 2016-08-14 DIAGNOSIS — R070 Pain in throat: Secondary | ICD-10-CM | POA: Diagnosis not present

## 2016-08-14 DIAGNOSIS — H9313 Tinnitus, bilateral: Secondary | ICD-10-CM | POA: Diagnosis not present

## 2016-08-14 DIAGNOSIS — H900 Conductive hearing loss, bilateral: Secondary | ICD-10-CM | POA: Diagnosis not present

## 2016-08-16 ENCOUNTER — Telehealth: Payer: Self-pay | Admitting: Cardiology

## 2016-08-16 ENCOUNTER — Telehealth: Payer: Self-pay | Admitting: Medical Oncology

## 2016-08-16 NOTE — Telephone Encounter (Signed)
Per pt she is calling the office back she thinks she just missed the call.

## 2016-08-16 NOTE — Telephone Encounter (Signed)
Spoke to patient , informed patient okay to take prenatal  Vitamin will not effect other prescribed medications  patient verbalized understanding.

## 2016-08-16 NOTE — Telephone Encounter (Signed)
Pt reports"  sticky thick phlegm all over my tongue" x 3 -4 weeks. Saw ENT who said "I have 20 % hearing loss"taking MMW and flonase. I instructed pt to see PCP.

## 2016-08-16 NOTE — Telephone Encounter (Signed)
New message  Pt call requesting to speak with RN. Pt states she was instructed to take prenatal vitamins to help her hair grow back post kemo. Pt would like to know if it would be safe to take with current medications. Please call back to discuss

## 2016-08-16 NOTE — Progress Notes (Signed)
[facility] Note  Called client to set up next appointment: 4/5 at [facility].  [place], [facility] [place]

## 2016-08-19 DIAGNOSIS — H2513 Age-related nuclear cataract, bilateral: Secondary | ICD-10-CM | POA: Diagnosis not present

## 2016-08-19 DIAGNOSIS — H25013 Cortical age-related cataract, bilateral: Secondary | ICD-10-CM | POA: Diagnosis not present

## 2016-08-19 DIAGNOSIS — H04123 Dry eye syndrome of bilateral lacrimal glands: Secondary | ICD-10-CM | POA: Diagnosis not present

## 2016-08-19 DIAGNOSIS — H01003 Unspecified blepharitis right eye, unspecified eyelid: Secondary | ICD-10-CM | POA: Diagnosis not present

## 2016-08-21 ENCOUNTER — Ambulatory Visit (INDEPENDENT_AMBULATORY_CARE_PROVIDER_SITE_OTHER): Payer: Medicare Other | Admitting: Cardiology

## 2016-08-21 ENCOUNTER — Encounter: Payer: Self-pay | Admitting: Cardiology

## 2016-08-21 VITALS — BP 111/62 | HR 80 | Ht 60.0 in | Wt 126.0 lb

## 2016-08-21 DIAGNOSIS — I2510 Atherosclerotic heart disease of native coronary artery without angina pectoris: Secondary | ICD-10-CM | POA: Diagnosis not present

## 2016-08-21 DIAGNOSIS — I2089 Other forms of angina pectoris: Secondary | ICD-10-CM

## 2016-08-21 DIAGNOSIS — I208 Other forms of angina pectoris: Secondary | ICD-10-CM

## 2016-08-21 DIAGNOSIS — F411 Generalized anxiety disorder: Secondary | ICD-10-CM

## 2016-08-21 DIAGNOSIS — Z9861 Coronary angioplasty status: Secondary | ICD-10-CM

## 2016-08-21 DIAGNOSIS — E860 Dehydration: Secondary | ICD-10-CM | POA: Diagnosis not present

## 2016-08-21 DIAGNOSIS — I10 Essential (primary) hypertension: Secondary | ICD-10-CM | POA: Diagnosis not present

## 2016-08-21 DIAGNOSIS — I2119 ST elevation (STEMI) myocardial infarction involving other coronary artery of inferior wall: Secondary | ICD-10-CM

## 2016-08-21 DIAGNOSIS — R002 Palpitations: Secondary | ICD-10-CM | POA: Diagnosis not present

## 2016-08-21 DIAGNOSIS — E785 Hyperlipidemia, unspecified: Secondary | ICD-10-CM

## 2016-08-21 NOTE — Patient Instructions (Signed)
Medication Instructions:  Your physician recommends that you continue on your current medications as directed. Please refer to the Current Medication list given to you today.  Labwork: NONE   Testing/Procedures: NONE   Follow-Up: Your physician wants you to follow-up in: [place]. You will receive a reminder letter in the mail two months in advance. If you don't receive a letter, please call our office to schedule the follow-up appointment.  Any Other Special Instructions Will Be Listed Below (If Applicable).     If you need a refill on your cardiac medications before your next appointment, please call your pharmacy.
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
## 2016-08-24 ENCOUNTER — Emergency Department (HOSPITAL_COMMUNITY)
Admission: EM | Admit: 2016-08-24 | Discharge: 2016-08-24 | Disposition: A | Discharge status: Home / Self Care | Payer: Medicare Other | Attending: Emergency Medicine | Admitting: Emergency Medicine

## 2016-08-24 ENCOUNTER — Other Ambulatory Visit: Payer: Self-pay | Admitting: Cardiology

## 2016-08-24 ENCOUNTER — Emergency Department (HOSPITAL_COMMUNITY): Payer: Medicare Other

## 2016-08-24 ENCOUNTER — Encounter (HOSPITAL_COMMUNITY): Payer: Self-pay

## 2016-08-24 DIAGNOSIS — Z9861 Coronary angioplasty status: Principal | ICD-10-CM

## 2016-08-24 DIAGNOSIS — R109 Unspecified abdominal pain: Secondary | ICD-10-CM | POA: Diagnosis not present

## 2016-08-24 DIAGNOSIS — Z85118 Personal history of other malignant neoplasm of bronchus and lung: Secondary | ICD-10-CM | POA: Diagnosis not present

## 2016-08-24 DIAGNOSIS — Z79899 Other long term (current) drug therapy: Secondary | ICD-10-CM | POA: Insufficient documentation

## 2016-08-24 DIAGNOSIS — J449 Chronic obstructive pulmonary disease, unspecified: Secondary | ICD-10-CM | POA: Insufficient documentation

## 2016-08-24 DIAGNOSIS — R1084 Generalized abdominal pain: Secondary | ICD-10-CM | POA: Diagnosis not present

## 2016-08-24 DIAGNOSIS — I129 Hypertensive chronic kidney disease with stage 1 through stage 4 chronic kidney disease, or unspecified chronic kidney disease: Secondary | ICD-10-CM | POA: Diagnosis not present

## 2016-08-24 DIAGNOSIS — Z955 Presence of coronary angioplasty implant and graft: Secondary | ICD-10-CM | POA: Diagnosis not present

## 2016-08-24 DIAGNOSIS — I252 Old myocardial infarction: Secondary | ICD-10-CM | POA: Diagnosis not present

## 2016-08-24 DIAGNOSIS — I2510 Atherosclerotic heart disease of native coronary artery without angina pectoris: Secondary | ICD-10-CM

## 2016-08-24 DIAGNOSIS — Z87891 Personal history of nicotine dependence: Secondary | ICD-10-CM | POA: Diagnosis not present

## 2016-08-24 DIAGNOSIS — N189 Chronic kidney disease, unspecified: Secondary | ICD-10-CM | POA: Insufficient documentation

## 2016-08-24 DIAGNOSIS — R1013 Epigastric pain: Secondary | ICD-10-CM | POA: Diagnosis present

## 2016-08-24 DIAGNOSIS — J439 Emphysema, unspecified: Secondary | ICD-10-CM | POA: Diagnosis not present

## 2016-08-24 LAB — COMPREHENSIVE METABOLIC PANEL
ALT: 13 U/L — ABNORMAL LOW (ref 14–54)
AST: 28 U/L (ref 15–41)
Albumin: 4 g/dL (ref 3.5–5.0)
Alkaline Phosphatase: 54 U/L (ref 38–126)
Anion gap: 8 (ref 5–15)
BUN: 10 mg/dL (ref 6–20)
CO2: 26 mmol/L (ref 22–32)
Calcium: 9.6 mg/dL (ref 8.9–10.3)
Chloride: 107 mmol/L (ref 101–111)
Creatinine, Ser: 0.55 mg/dL (ref 0.44–1.00)
GFR calc Af Amer: >60 mL/min (ref >60)
GFR calc non Af Amer: >60 mL/min (ref >60)
Glucose, Bld: 140 mg/dL — ABNORMAL HIGH (ref 65–99)
Potassium: 4 mmol/L (ref 3.5–5.1)
Sodium: 141 mmol/L (ref 135–145)
Total Bilirubin: 0.4 mg/dL (ref 0.3–1.2)
Total Protein: 6 g/dL — ABNORMAL LOW (ref 6.5–8.1)

## 2016-08-24 LAB — URINALYSIS, ROUTINE W REFLEX MICROSCOPIC
Bilirubin Urine: NEGATIVE
Glucose, UA: NEGATIVE mg/dL
Hgb urine dipstick: NEGATIVE
Ketones, ur: NEGATIVE mg/dL
Leukocytes, UA: NEGATIVE
Nitrite: NEGATIVE
Protein, ur: NEGATIVE mg/dL
Specific Gravity, Urine: 1.003 — ABNORMAL LOW (ref 1.005–1.030)
pH: 7 (ref 5.0–8.0)

## 2016-08-24 LAB — CBC
HCT: 40.4 % (ref 36.0–46.0)
Hemoglobin: 13.2 g/dL (ref 12.0–15.0)
MCH: 30.6 pg (ref 26.0–34.0)
MCHC: 32.7 g/dL (ref 30.0–36.0)
MCV: 93.7 fL (ref 78.0–100.0)
Platelets: 143 10*3/uL — ABNORMAL LOW (ref 150–400)
RBC: 4.31 MIL/uL (ref 3.87–5.11)
RDW: 13.3 % (ref 11.5–15.5)
WBC: 5.2 10*3/uL (ref 4.0–10.5)

## 2016-08-24 LAB — TROPONIN I: Troponin I: <0.03 ng/mL (ref <0.03)

## 2016-08-24 LAB — LIPASE, BLOOD: Lipase: 20 U/L (ref 11–51)

## 2016-08-24 MED ORDER — IOPAMIDOL (ISOVUE-300) INJECTION 61%
INTRAVENOUS | Status: AC
  Administered 2016-08-24: 100 mL
  Filled 2016-08-24: qty 100

## 2016-08-24 MED ORDER — MORPHINE SULFATE (PF) 4 MG/ML IV SOLN
4.0000 mg | Freq: Once | INTRAVENOUS | Status: AC
  Administered 2016-08-24: 4 mg via INTRAVENOUS
  Filled 2016-08-24: qty 1

## 2016-08-24 MED ORDER — IOPAMIDOL (ISOVUE-300) INJECTION 61%
INTRAVENOUS | Status: AC
  Filled 2016-08-24: qty 75

## 2016-08-24 MED ORDER — MORPHINE SULFATE (PF) 4 MG/ML IV SOLN
4.0000 mg | Freq: Once | INTRAVENOUS | Status: AC
  Administered 2016-08-24: 4 mg via INTRAVENOUS
  Filled 2016-08-24 (×2): qty 1

## 2016-08-24 NOTE — ED Notes (Signed)
Pt ambulatory with steady, independent gait to room B18. Pt changed into hospital gown. Family at bedside.

## 2016-08-24 NOTE — ED Notes (Signed)
Pt reports she had an episode of chest pain about 20 minutes prior to the arrival in B18. EKG performed.

## 2016-08-24 NOTE — ED Notes (Signed)
EKG hand delivered to Dr. Dayna Barker
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
## 2016-08-24 NOTE — ED Triage Notes (Signed)
Onset 2 weeks RUQ abd pain radiating across upper abd pain, left shoulder pain.  Was seen at cardiology office last week.  No N/V/D.  Last BM yesterday.

## 2016-08-25 NOTE — Telephone Encounter (Signed)
Pt called with questions about her BP med. She has been taking carvedilol 6.25 mg bid and thinks that she was told in the past to decrease it to 3.125 mg bid due to weakness. When she refilled it in February it was still 6.25 mg. She is feeling weak and is not sure if this is related to the carvedilol or her ongoing cancer treatment. Upon record review I do not see where it was decreased, but she was seen for weakness in 12/17.  I offered for pt to go ahead and cut her pills in half or I could send in 3.125 mg and she could try and see if she feels better. She is reluctant as is afraid that her BP will go up. She does not have a working BP cuff.   Advised that she can cut her pills in half and try for a week and see if she feels stronger and call the office if she has more concerns. Let us know if she needs a new prescription.  Advised to drink more water and stay hydrated.   Of note the chart indicated a refill request for her Imdur. Dr. Allison Quarry most recent office note is not yet complete so I am unable to verify that this should be refilled. Will forward this note to Dr. [name] for decision and to send if refill if appropriate.   Daune Perch, NP

## 2016-08-26 ENCOUNTER — Encounter: Payer: Self-pay | Admitting: *Deleted

## 2016-08-26 ENCOUNTER — Other Ambulatory Visit: Payer: Self-pay | Admitting: *Deleted

## 2016-08-26 ENCOUNTER — Telehealth: Payer: Self-pay | Admitting: Medical Oncology

## 2016-08-26 ENCOUNTER — Ambulatory Visit (HOSPITAL_COMMUNITY): Payer: Medicare Other

## 2016-08-26 ENCOUNTER — Other Ambulatory Visit: Payer: Medicare Other

## 2016-08-26 NOTE — Telephone Encounter (Signed)
Returned call and told her she did not need labs or scans .

## 2016-08-26 NOTE — Patient Outreach (Signed)
[place] [facility]) Care Management  08/26/2016  HALLIE ERTL February 09, 1956 [id number]   RN Health Coach telephone call to patient.  [employer] compliance verified. RN described services available from [facility]. Per patient she was sleeping . Patient agreed to receive information but does not want to go through the screening Plan. RN will send patient brochure at this time.   [facility] [phone]

## 2016-08-27 ENCOUNTER — Other Ambulatory Visit: Payer: Self-pay | Admitting: Cardiology

## 2016-08-27 ENCOUNTER — Encounter: Payer: Self-pay | Admitting: Cardiology

## 2016-08-27 DIAGNOSIS — I2510 Atherosclerotic heart disease of native coronary artery without angina pectoris: Secondary | ICD-10-CM

## 2016-08-27 DIAGNOSIS — Z9861 Coronary angioplasty status: Principal | ICD-10-CM

## 2016-08-27 NOTE — Assessment & Plan Note (Signed)
I actually don't think she is actually having anginal symptoms. Negative ischemia on Myoview.

## 2016-08-27 NOTE — Assessment & Plan Note (Signed)
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 Interestingly, the inferolateral infarct noted on previous nuclear stress test was much less remarkable on the most recent stress test. Preserved EF.

## 2016-08-27 NOTE — Assessment & Plan Note (Signed)
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 No ischemia noted on recent Myoview. She has frequent bouts of unusual chest discomfort which was thought potentially related to coronary spasm at one time. Thankfully symptoms of been somewhat stable throughout her cancer treatment. Continue current dose of carvedilol, although we may need to monitor if she has further hypotension spells. She is on low-dose Imdur tolerating well. She takes Plavix without aspirin and is on statin.

## 2016-08-27 NOTE — Assessment & Plan Note (Signed)
She is so difficult to treat with anything for this. She is currently on protocol, but I don't see any recent labs since March 2017. I think I would like to get her out of her chemotherapy treatment regimen timeframe enough that we can recheck an accurate level. This can be done when I see her back in follow-up.

## 2016-08-27 NOTE — Assessment & Plan Note (Signed)
Stable. If anything hypotensive on occasion. Continue current regimen for now, but would not add any additional treatment.

## 2016-08-27 NOTE — Assessment & Plan Note (Signed)
Over half the visit again was spent trying to explain reexplain her nutrition status following chemotherapy and how this is normal. We discussed how this makes her dizzy and how important it is to ensure adequate hydration and food intake. She was concerned that the Mardene Celeste just recommended supplementation treatments that had high sodium and she was concerned his sodium would hurt her heart. The Holter down and explained to her that the nutritionist is trained to recommend the appropriate meal supplementation based on the patient's medical history. I recommended that she follow the nutrition advice.  I truthfully think that she needs to be on his SSRI and she has continued to decline. She takes Klonopin and I am fine with that but I think she needs long-term treatment

## 2016-08-27 NOTE — Assessment & Plan Note (Signed)
Even though she says she eats, she has definite decrease in her overall body weight, appearing quite gaunt. See discussion in anxiety about prolonged discussions with nutrition etc.

## 2016-08-27 NOTE — Assessment & Plan Note (Signed)
Better controlled now on. Mostly because her recent hypotension episode, her when necessary beta blocker dose was discontinued. For now I would just simply treat these at episodes expectantly with refocusing and calming the patient down.

## 2016-08-28 ENCOUNTER — Encounter: Payer: Self-pay | Admitting: Internal Medicine

## 2016-08-28 ENCOUNTER — Telehealth: Payer: Self-pay | Admitting: Cardiology

## 2016-08-28 ENCOUNTER — Ambulatory Visit (HOSPITAL_BASED_OUTPATIENT_CLINIC_OR_DEPARTMENT_OTHER): Payer: Medicare Other | Admitting: Internal Medicine

## 2016-08-28 ENCOUNTER — Telehealth: Payer: Self-pay | Admitting: Internal Medicine

## 2016-08-28 VITALS — BP 106/55 | HR 81 | Temp 98.6°F | Resp 18 | Ht 65.0 in | Wt 125.1 lb

## 2016-08-28 DIAGNOSIS — H9190 Unspecified hearing loss, unspecified ear: Secondary | ICD-10-CM

## 2016-08-28 DIAGNOSIS — R109 Unspecified abdominal pain: Secondary | ICD-10-CM

## 2016-08-28 DIAGNOSIS — C3491 Malignant neoplasm of unspecified part of right bronchus or lung: Secondary | ICD-10-CM

## 2016-08-28 NOTE — Telephone Encounter (Signed)
Gave patient AVS and calender per 08/28/2016 los. [facility] to contact patient with schedule.
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
## 2016-08-28 NOTE — Telephone Encounter (Signed)
Verified with pharmacy  medication is available for pick up.  patient made aware. Patient states she is aware and she will pick up medication

## 2016-08-28 NOTE — Telephone Encounter (Signed)
New message    Pt is calling about her medication, isosorbide mononitrate 30 mg. She said she needs a refill but it keeps getting declined. She states her pharmacy has sent something over. She needs to find out what to do.

## 2016-08-31 ENCOUNTER — Other Ambulatory Visit: Payer: Self-pay | Admitting: Cardiology

## 2016-09-02 ENCOUNTER — Ambulatory Visit: Payer: Medicare Other | Attending: Radiation Oncology | Admitting: Physical Therapy

## 2016-09-02 VITALS — HR 80

## 2016-09-02 DIAGNOSIS — R5381 Other malaise: Secondary | ICD-10-CM | POA: Diagnosis not present

## 2016-09-02 DIAGNOSIS — R5383 Other fatigue: Secondary | ICD-10-CM | POA: Insufficient documentation

## 2016-09-02 DIAGNOSIS — M545 Low back pain: Secondary | ICD-10-CM | POA: Insufficient documentation

## 2016-09-02 DIAGNOSIS — M6281 Muscle weakness (generalized): Secondary | ICD-10-CM | POA: Diagnosis not present
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
## 2016-09-05 ENCOUNTER — Telehealth: Payer: Self-pay | Admitting: Radiation Oncology

## 2016-09-05 ENCOUNTER — Other Ambulatory Visit: Payer: Self-pay | Admitting: Radiation Therapy

## 2016-09-05 ENCOUNTER — Other Ambulatory Visit: Payer: Self-pay | Admitting: Cardiology

## 2016-09-05 DIAGNOSIS — J209 Acute bronchitis, unspecified: Secondary | ICD-10-CM | POA: Diagnosis not present

## 2016-09-05 DIAGNOSIS — C3491 Malignant neoplasm of unspecified part of right bronchus or lung: Secondary | ICD-10-CM

## 2016-09-05 DIAGNOSIS — R7303 Prediabetes: Secondary | ICD-10-CM | POA: Diagnosis not present

## 2016-09-05 NOTE — Telephone Encounter (Signed)
I called the patient to follow up on her questions. She called and spoke with the therapists. She's had trouble with phlegm production and will see her pcp today. She describes staggering and reports that she's noticed this in the last month with her phelgm production and cough. She had a recent restaging scan that did not show concerns for disease, and her last brain MRI in January 2018 was negative. We will plan to repeat her MRI brain to confirm there's no CNS disease and see her back to review this.    Carola Rhine, PAC

## 2016-09-05 NOTE — Progress Notes (Signed)
[place] Counseling Note  MH called to cancel appointment and to let counselor know that she did not want to continue with counseling. Counselor responded, letting [place] know that she was welcome to return to counseling if she wanted to try again.  Westly Pam, [place] [facility] [place]

## 2016-09-06 ENCOUNTER — Ambulatory Visit: Payer: Medicare Other | Admitting: Physical Therapy

## 2016-09-10 ENCOUNTER — Ambulatory Visit: Payer: Medicare Other | Admitting: Physical Therapy

## 2016-09-10 DIAGNOSIS — R5381 Other malaise: Secondary | ICD-10-CM | POA: Diagnosis not present

## 2016-09-10 DIAGNOSIS — R5383 Other fatigue: Secondary | ICD-10-CM

## 2016-09-10 DIAGNOSIS — M6281 Muscle weakness (generalized): Secondary | ICD-10-CM | POA: Diagnosis not present

## 2016-09-10 DIAGNOSIS — M545 Low back pain: Secondary | ICD-10-CM | POA: Diagnosis not present
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
## 2016-09-12 ENCOUNTER — Ambulatory Visit: Payer: Medicare Other | Admitting: Physical Therapy

## 2016-09-12 DIAGNOSIS — R5381 Other malaise: Secondary | ICD-10-CM | POA: Diagnosis not present

## 2016-09-12 DIAGNOSIS — M545 Low back pain: Secondary | ICD-10-CM

## 2016-09-12 DIAGNOSIS — M6281 Muscle weakness (generalized): Secondary | ICD-10-CM

## 2016-09-12 DIAGNOSIS — R5383 Other fatigue: Secondary | ICD-10-CM

## 2016-09-12 NOTE — Patient Instructions (Addendum)
.  Manual lymph drainage for the neck, anterior approach  Sit in front of a mirror. Do 5 slow deep breaths, breathing in through the nose and out through the mouth, letting your belly "inflate" as you breathe in.  Rest your hands on your abdomen as you do this to give slight pressure there.  1) Place hands on areas just behind collar bones and do 10 stationary circles with stretch in outward directions. 2) Do stationary circles at each armpit about 10 times. 3) Place one hand on the front of the opposite shoulder and do stationary circles with stretch downward toward underarm. 4) Repeat #1 above. 5) Imagine a river running in a line from the earlobe straight down the neck.  Place hands just behind this and do circles with stretch coming forward slightly and down, thinking about fluid flowing down that river.  Do 10 times. 6) Place one hand just in front of the river on one side and do circles with a slight back and then downward stretch, thinking again about putting that fluid in the river.  Do 10-20 times on each side. 7) Place one hand just slightly in front of the spot you just did and do the same thing.  DO THIS VERY GENTLY. 8) Use the webspace between your thumb and index finger to pump downward starting just under the chin and "stair stepping" downward with a stretch, working down to the chest. 9) Repeat #1. 10) Do stationary circles on each side of the face just above the chin, out and down with the stretch 10 times. 11) Do stationary circles on each side of the face on the cheeks with pressure going back and down, 10 times. 12) Do stationary circles on each side of the face between the eyes and ears, again back and downward 10 times. 13) Repeat steps 9,8,7,6,5,1,3 and 2 in that order!  Do not slide on the skin, but STRETCH it with your motions. Only give enough pressure to stretch the skin. DO THIS SLOWLY, PLEASE!  And do once a day.HIP: Flexion / KNEE: Extension, Straight Leg  Raise    Raise leg, keeping knee straight. Perform slowly. ___ reps per set, ___ sets per day, ___ days per week   Copyright  VHI. All rights reserved.  Bridge    Lie back, legs bent. Inhale, pressing hips up. Keeping ribs in, lengthen lower back. Exhale, rolling down along spine from top. Repeat ____ times. Do ____ sessions per day.  http://pm.exer.us/55   Copyright  VHI. All rights reserved.    Functional Quadriceps: Sit to Stand    Sit on edge of chair, feet flat on floor. Stand upright, extending knees fully. Repeat ____ times per set. Do ____ sets per session. Do ____ sessions per day.  http://orth.exer.us/735   Copyright  VHI. All rights reserved.
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
## 2016-09-17 ENCOUNTER — Ambulatory Visit: Payer: Medicare Other | Admitting: Physical Therapy

## 2016-09-17 DIAGNOSIS — H6523 Chronic serous otitis media, bilateral: Secondary | ICD-10-CM | POA: Diagnosis not present

## 2016-09-17 DIAGNOSIS — H906 Mixed conductive and sensorineural hearing loss, bilateral: Secondary | ICD-10-CM | POA: Diagnosis not present

## 2016-09-17 DIAGNOSIS — H6983 Other specified disorders of Eustachian tube, bilateral: Secondary | ICD-10-CM | POA: Diagnosis not present

## 2016-09-17 DIAGNOSIS — H900 Conductive hearing loss, bilateral: Secondary | ICD-10-CM | POA: Diagnosis not present

## 2016-09-18 ENCOUNTER — Telehealth: Payer: Self-pay | Admitting: Cardiology

## 2016-09-18 NOTE — Telephone Encounter (Signed)
Returned the phone call to the patient. She stated that she wanted to verify that should she keep taking her Plavix 75 mg even though she has a procedure on 4/27. She stated that it is a procedure to have tubes placed in her ears. The surgeon stated that she will only have small incisions.  She stated that the surgeon for this procedure has verified (twice) that she should keep taking her Plavix and should not hold it. Dr. [name] has agreed.

## 2016-09-18 NOTE — Telephone Encounter (Signed)
New Message  Pt call requesting to speak with RN about a procedure on 09/27/2016. Please call back to discuss

## 2016-09-19 ENCOUNTER — Ambulatory Visit: Payer: Medicare Other | Admitting: Physical Therapy

## 2016-09-19 DIAGNOSIS — R5381 Other malaise: Secondary | ICD-10-CM | POA: Diagnosis not present

## 2016-09-19 DIAGNOSIS — M545 Low back pain: Secondary | ICD-10-CM

## 2016-09-19 DIAGNOSIS — M6281 Muscle weakness (generalized): Secondary | ICD-10-CM

## 2016-09-19 DIAGNOSIS — R5383 Other fatigue: Secondary | ICD-10-CM
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
## 2016-09-19 NOTE — Patient Instructions (Signed)

## 2016-09-20 ENCOUNTER — Ambulatory Visit
Admission: RE | Admit: 2016-09-20 | Discharge: 2016-09-20 | Disposition: A | Discharge status: Home / Self Care | Payer: Medicare Other | Source: Ambulatory Visit | Attending: Radiation Oncology | Admitting: Radiation Oncology

## 2016-09-20 DIAGNOSIS — C3490 Malignant neoplasm of unspecified part of unspecified bronchus or lung: Secondary | ICD-10-CM | POA: Diagnosis not present

## 2016-09-20 DIAGNOSIS — C3491 Malignant neoplasm of unspecified part of right bronchus or lung: Secondary | ICD-10-CM

## 2016-09-20 MED ORDER — GADOBENATE DIMEGLUMINE 529 MG/ML IV SOLN
10.0000 mL | Freq: Once | INTRAVENOUS | Status: AC | PRN
  Administered 2016-09-20: 10 mL via INTRAVENOUS

## 2016-09-22 ENCOUNTER — Encounter: Payer: Self-pay | Admitting: Internal Medicine

## 2016-09-23 ENCOUNTER — Encounter: Payer: Self-pay | Admitting: Radiation Oncology

## 2016-09-24 ENCOUNTER — Other Ambulatory Visit: Payer: Self-pay | Admitting: Cardiology

## 2016-09-25 ENCOUNTER — Ambulatory Visit
Admission: RE | Admit: 2016-09-25 | Discharge: 2016-09-25 | Disposition: A | Discharge status: Home / Self Care | Payer: Medicare Other | Source: Ambulatory Visit | Attending: Radiation Oncology | Admitting: Radiation Oncology

## 2016-09-25 ENCOUNTER — Encounter: Payer: Self-pay | Admitting: Radiation Oncology

## 2016-09-25 ENCOUNTER — Ambulatory Visit: Payer: Medicare Other | Admitting: Physical Therapy

## 2016-09-25 VITALS — BP 98/64 | HR 72 | Temp 98.0°F | Ht 65.0 in | Wt 118.8 lb

## 2016-09-25 DIAGNOSIS — Z79899 Other long term (current) drug therapy: Secondary | ICD-10-CM | POA: Diagnosis not present

## 2016-09-25 DIAGNOSIS — Z886 Allergy status to analgesic agent status: Secondary | ICD-10-CM | POA: Diagnosis not present

## 2016-09-25 DIAGNOSIS — C771 Secondary and unspecified malignant neoplasm of intrathoracic lymph nodes: Secondary | ICD-10-CM | POA: Diagnosis not present

## 2016-09-25 DIAGNOSIS — C3491 Malignant neoplasm of unspecified part of right bronchus or lung: Secondary | ICD-10-CM | POA: Diagnosis not present

## 2016-09-25 DIAGNOSIS — Z7902 Long term (current) use of antithrombotics/antiplatelets: Secondary | ICD-10-CM | POA: Diagnosis not present

## 2016-09-25 NOTE — Progress Notes (Signed)
Radiation Oncology         [phone] ________________________________  Name: Michele [name] MRN: [id number]  Date: 09/25/2016  DOB: 1956/04/25  Follow-Up Visit Note  Outpatient  CC: [name], MD  Curt Bears, MD  Diagnosis and Prior Radiotherapy:    ICD-9-CM ICD-10-CM   1. Secondary malignancy of mediastinal lymph nodes (HCC) 196.1 C77.1   2. Small cell carcinoma of right lung (HCC) 162.9 C34.91    Limited Stage IIIA T0N2M0 small cell lung cancer with secondary malignancy of mediastinal lymph nodes  03/19/16 - 05/06/16 : Mediastinum treated to 66 Gy in 33 fractions at 2 Gy per fraction 06/25/16-  07/08/16 : Whole Brain treated to 25 Gy in 10 fractions of 2.5 Gy per fraction  CHIEF COMPLAINT: Here for follow-up and surveillance of small cell lung cancer  Narrative:  The patient returns today for routine follow-up of radiation completed 07/08/16.  CT CAP was performed on 08/24/16 for severe abdominal pain and chest pain. The mediastinal nodes were stable. There were no accute abnormalities. Ultrasound of the pelvis was performed in February for postmenopausal bleeding. The uterus appeared normal. The ovaries appeared normal. Her mammography in February was also benign. Patient saw Dr. Constance Holster on 07/18/16 for bilateral ear symptoms. Exam showed no erythema, swelling, or abnormalities of the tympanic membranes or middle ears. No specific therapy was recommended.  On review of systems, the patient reports pain across her abdomen which she rates a 4/10 in severity. She reports a painful lymph node to the right side of her neck. She also reports thick mucous especially during the night and in the morning when she wakes up. She is not eating well. Patient has lost approximately 7 lbs in the last month. She will occasionally drink Ensure. She is drinking four 16 ounce containers of water daily. She reports hearing and vision difficulties. She also reports increased white phlegm production  which is bothersome.  She reports she plans to have tubes in her ears with Dr Benjamine Mola this upcoming Friday depending on the results of her MRI scan.  She had fluid in her inner ears, she reports.  ALLERGIES:  is allergic to ciprofloxacin; aspirin; crestor [rosuvastatin]; ibuprofen; wellbutrin [bupropion]; lipitor [atorvastatin]; and sulfonamide derivatives.  Meds: Current Outpatient Prescriptions  Medication Sig Dispense Refill  . Calcium Carbonate-Vitamin D (CALCIUM 600+D PO) Take 1 tablet by mouth daily.     . carvedilol (COREG) 6.25 MG tablet Take 1 tablet (6.25 mg total) by mouth 2 (two) times daily with a meal. 180 tablet 3  . clonazePAM (KLONOPIN) 2 MG tablet Take 2 mg by mouth 3 (three) times daily as needed for anxiety.     . clopidogrel (PLAVIX) 75 MG tablet TAKE 1 TABLET(75 MG) BY MOUTH DAILY 30 tablet 3  . Coenzyme Q10 (COQ10) 100 MG CAPS Take 100 mg by mouth daily. Reported on 10/16/2015    . fluticasone (FLONASE) 50 MCG/ACT nasal spray Place 2 sprays into both nostrils every morning.    . isosorbide mononitrate (IMDUR) 30 MG 24 hr tablet TAKE 1 TABLET(30 MG) BY MOUTH AT BEDTIME 90 tablet 3  . magic mouthwash SOLN Take 5 mLs by mouth 4 (four) times daily.    [name] neomycin-polymyxin b-dexamethasone (MAXITROL) 3.5-10000-0.1 OINT Place 1 application into both eyes at bedtime.     . nitroGLYCERIN (NITROSTAT) 0.4 MG SL tablet DISSOLVE 1 TABLET UNDER THE TONGUE EVERY 5 MINUTES AS NEEDED FOR CHEST PAIN 25 tablet 0  . pantoprazole (PROTONIX) 40 MG tablet  Take 1 tablet (40 mg total) by mouth daily. 30 tablet 10  . Polyethyl Glycol-Propyl Glycol (SYSTANE OP) Place 2 drops into both eyes 4 (four) times daily.    . polyethylene glycol (MIRALAX / GLYCOLAX) packet Take 17 g by mouth daily. 14 each 0  . pravastatin (PRAVACHOL) 40 MG tablet TAKE 1 TABLET(40 MG) BY MOUTH EVERY EVENING 90 tablet 2  . sennosides-docusate sodium (SENOKOT-S) 8.6-50 MG tablet Take 1 tablet by mouth 2 (two) times daily as  needed for constipation.     [name] tiotropium (SPIRIVA) 18 MCG inhalation capsule Place 18 mcg into inhaler and inhale daily.    [name] VASCEPA 1 g CAPS TAKE 1 CAPSULES BY MOUTH TWICE DAILY 60 capsule 9  . VENTOLIN HFA 108 (90 Base) MCG/ACT inhaler Inhale 2 puffs into the lungs every 4 (four) hours as needed for wheezing or shortness of breath.      No current facility-administered medications for this encounter.    Facility-Administered Medications Ordered in Other Encounters  Medication Dose Route Frequency Provider Last Rate Last Dose  . HYDROcodone-acetaminophen (NORCO/VICODIN) 5-325 MG per tablet 1 tablet  1 tablet Oral Once Susanne Borders, NP        Physical Findings:    height is '5\' 5"'$  (1.651 m) and weight is 118 lb 12.8 oz (53.9 kg). Her temperature is 98 F (36.7 C). Her blood pressure is 98/64 and her pulse is 72. Her oxygen saturation is 98%.  General: Alert and oriented, in no acute distress. HEENT: Cerumen bilaterally, difficult to see either tympanic membranes. Neck: Neck is supple, no palpable cervical or supraclavicular lymphadenopathy or masses. Heart: Regular in rate and rhythm with no murmurs. Chest: Clear to auscultation bilaterally. Abdomen: normoactive bowel sounds to auscultation, somewhat sore in right upper quadrant of abdomen. Neuro: non focal  Lab Findings: Lab Results  Component Value Date   WBC 5.2 08/24/2016   HGB 13.2 08/24/2016   HCT 40.4 08/24/2016   MCV 93.7 08/24/2016   PLT 143 (L) 08/24/2016    Lab Results  Component Value Date   TSH 1.316 08/09/2016    CMP     Component Value Date/Time   NA 141 08/24/2016 1315   NA 144 08/09/2016 1145   K 4.0 08/24/2016 1315   K 4.1 08/09/2016 1145   CL 107 08/24/2016 1315   CO2 26 08/24/2016 1315   CO2 28 08/09/2016 1145   GLUCOSE 140 (H) 08/24/2016 1315   GLUCOSE 129 08/09/2016 1145   BUN 10 08/24/2016 1315   BUN 14.6 08/09/2016 1145   CREATININE 0.55 08/24/2016 1315   CREATININE 0.7 08/09/2016 1145    CALCIUM 9.6 08/24/2016 1315   CALCIUM 9.7 08/09/2016 1145   PROT 6.0 (L) 08/24/2016 1315   PROT 6.6 08/09/2016 1145   ALBUMIN 4.0 08/24/2016 1315   ALBUMIN 4.1 08/09/2016 1145   AST 28 08/24/2016 1315   AST 25 08/09/2016 1145   ALT 13 (L) 08/24/2016 1315   ALT 10 08/09/2016 1145   ALKPHOS 54 08/24/2016 1315   ALKPHOS 64 08/09/2016 1145   BILITOT 0.4 08/24/2016 1315   BILITOT 0.36 08/09/2016 1145   GFRNONAA >60 08/24/2016 1315   GFRAA >60 08/24/2016 1315    Radiographic Findings: Mr Jeri Cos Wo Contrast  Result Date: 09/20/2016 CLINICAL DATA:  Small cell lung cancer. Gait disturbance. Hearing loss. Weakness and depression. EXAM: MRI HEAD WITHOUT AND WITH CONTRAST TECHNIQUE: Multiplanar, multiecho pulse sequences of the brain and surrounding structures were obtained without and  with intravenous contrast. CONTRAST:  25m MULTIHANCE GADOBENATE DIMEGLUMINE 529 MG/ML IV SOLN COMPARISON:  MRI brain 06/12/2016. FINDINGS: Brain: No focal lesions are present. There is no pathologic enhancement to suggest metastatic disease of the brain or meninges. 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 No acute hemorrhage or mass lesion is present. The ventricles are of normal size. No significant extra-axial fluid collection is present. Vascular: Flow is present in the major intracranial arteries. Skull and upper cervical spine: The skullbase is within normal limits. Midline sagittal structures are otherwise unremarkable. Craniocervical junction is normal. Sinuses/Orbits: The paranasal sinuses are clear. Bilateral mastoid effusions are present. No obstructing nasopharyngeal lesion is present. The globes and orbits are within normal limits. IMPRESSION: 1. No evidence for metastatic disease to the brain. 2. No acute intracranial abnormality or significant interval change. 3. Bilateral mastoid effusions are new. No significant nasopharyngeal lesion is evident. Electronically Signed   By: CSan MorelleM.D.   On:  09/20/2016 17:03    Impression/Plan: 61year old woman with limited stage IIIA T0N2M0 small cell lung cancer with secondary malignancy of mediastinal lymph nodes. NED per brain MRI.  New mastoid effusions.  I advised the patient to maintain her fluid intake. I recommended she continue to take Ensure to maintain her weight. I also recommended the patient try a high protein diet if she is particularly concerned about sugary foods. We discussed that the patient may require a feeding tube if she cannot maintain a healthy weight. We discussed nutrition in depth. She declines another referral to dietician.  The patient may try OTC Mucinex for bothersome phlegm production.  I advised the patient to call Dr. TDeeann Saintoffice to discuss her recent MRI, the noted bilateral mastoid effusions, and her upcoming ear tube procedure. Her significant other will call tomorrow AM. I'll ask my nurse to inform his nurse as well, in case this info is relevant to Dr. TMartyn Malay  Follow up with radiation oncology in 4 months with preceding brain MRI.  I spent 30 minutes face to face with the patient and more than 50% of that time was spent in counseling and/or coordination of care. _____________________________________   SEppie Gibson MD  This document serves as a record of services personally performed by SEppie Gibson MD. It was created on her behalf by SMaryla Morrow a trained medical scribe. The creation of this record is based on the scribe's personal observations and the provider's statements to them. This document has been checked and approved by the attending provider.

## 2016-09-25 NOTE — Progress Notes (Signed)
Ms. Razavi presents for follow up of radiation completed 05/06/16 to her Mediastinum, and Whole Brain radiation completed 07/08/16.  She reports pain across her abdomen which she rates a 4/10. She does have a painful lymph node to the right side of her neck. She also reports thick mucous especially during the night and in the morning when she wakes up. She is not eating well, and has lost weight since her last visit. She will occasionally drink ensure. She is drinking four 16 ounce containers of water daily. She has hearing and vision difficulties. She plans to have tubes in her ears this upcoming Friday depending on the results of her MRI scan.   BP 98/64   Pulse 72   Temp 98 F (36.7 C)   Ht '5\' 5"'$  (1.651 m)   Wt 118 lb 12.8 oz (53.9 kg)   SpO2 98% Comment: room air  BMI 19.77 kg/m    Wt Readings from Last 3 Encounters:  09/25/16 118 lb 12.8 oz (53.9 kg)  08/28/16 125 lb 1.6 oz (56.7 kg)  08/24/16 126 lb (57.2 kg)

## 2016-09-26 ENCOUNTER — Ambulatory Visit: Payer: Medicare Other | Admitting: Physical Therapy

## 2016-09-26 DIAGNOSIS — R5381 Other malaise: Secondary | ICD-10-CM | POA: Diagnosis not present

## 2016-09-26 DIAGNOSIS — M545 Low back pain: Secondary | ICD-10-CM | POA: Diagnosis not present

## 2016-09-26 DIAGNOSIS — M6281 Muscle weakness (generalized): Secondary | ICD-10-CM | POA: Diagnosis not present

## 2016-09-26 DIAGNOSIS — R5383 Other fatigue: Secondary | ICD-10-CM
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
## 2016-09-26 NOTE — Patient Instructions (Signed)
Functional Quadriceps: Sit to Stand    Sit on edge of chair, feet flat on floor. Stand upright, extending knees fully. Repeat _10___ times per set.   http://orth.exer.us/735   Copyright  VHI. All rights reserved.   Heel Raise: Bilateral (Standing)    Rise on balls of feet. Repeat ___10_ times per set.   http://orth.exer.us/39   Copyright  VHI. All rights reserved.   Marching In-Place    Standing straight, alternate bringing knees toward trunk. Arms swing alternately.  DO IN SLOW MOTION CONCENTRATING ON BALANCE   Do 10___ times for each leg   Copyright  VHI. All rights reserved.  HIP: Flexion / KNEE: Extension, Straight Leg Raise    Raise leg, keeping knee straight. Perform slowly. 10___ reps per set,    Copyright  VHI. All rights reserved.

## 2016-09-27 ENCOUNTER — Ambulatory Visit: Payer: Medicare Other | Admitting: Physical Therapy

## 2016-09-27 DIAGNOSIS — H900 Conductive hearing loss, bilateral: Secondary | ICD-10-CM | POA: Diagnosis not present

## 2016-09-27 DIAGNOSIS — H6983 Other specified disorders of Eustachian tube, bilateral: Secondary | ICD-10-CM | POA: Diagnosis not present

## 2016-09-27 DIAGNOSIS — H6523 Chronic serous otitis media, bilateral: Secondary | ICD-10-CM | POA: Diagnosis not present

## 2016-09-27 DIAGNOSIS — H6993 Unspecified Eustachian tube disorder, bilateral: Secondary | ICD-10-CM | POA: Diagnosis not present

## 2016-09-30 ENCOUNTER — Ambulatory Visit: Payer: Medicare Other | Admitting: Physical Therapy

## 2016-09-30 VITALS — HR 78

## 2016-09-30 DIAGNOSIS — M6281 Muscle weakness (generalized): Secondary | ICD-10-CM

## 2016-09-30 DIAGNOSIS — M545 Low back pain: Secondary | ICD-10-CM | POA: Diagnosis not present

## 2016-09-30 DIAGNOSIS — R5383 Other fatigue: Secondary | ICD-10-CM | POA: Diagnosis not present

## 2016-09-30 DIAGNOSIS — R5381 Other malaise: Secondary | ICD-10-CM | POA: Diagnosis not present
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
## 2016-10-01 HISTORY — PX: OTHER SURGICAL HISTORY: SHX169

## 2016-10-02 ENCOUNTER — Ambulatory Visit: Payer: Medicare Other | Attending: Radiation Oncology | Admitting: Physical Therapy

## 2016-10-02 VITALS — HR 90

## 2016-10-02 DIAGNOSIS — R5381 Other malaise: Secondary | ICD-10-CM | POA: Diagnosis not present

## 2016-10-02 DIAGNOSIS — R5383 Other fatigue: Secondary | ICD-10-CM | POA: Diagnosis not present

## 2016-10-02 DIAGNOSIS — M545 Low back pain: Secondary | ICD-10-CM | POA: Insufficient documentation

## 2016-10-02 DIAGNOSIS — M6281 Muscle weakness (generalized): Secondary | ICD-10-CM | POA: Insufficient documentation
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
## 2016-10-03 DIAGNOSIS — J439 Emphysema, unspecified: Secondary | ICD-10-CM | POA: Diagnosis not present

## 2016-10-03 DIAGNOSIS — L989 Disorder of the skin and subcutaneous tissue, unspecified: Secondary | ICD-10-CM | POA: Diagnosis not present

## 2016-10-07 ENCOUNTER — Ambulatory Visit: Payer: Medicare Other

## 2016-10-07 DIAGNOSIS — R5381 Other malaise: Secondary | ICD-10-CM | POA: Diagnosis not present

## 2016-10-07 DIAGNOSIS — R5383 Other fatigue: Secondary | ICD-10-CM

## 2016-10-07 DIAGNOSIS — M545 Low back pain: Secondary | ICD-10-CM | POA: Diagnosis not present

## 2016-10-07 DIAGNOSIS — M6281 Muscle weakness (generalized): Secondary | ICD-10-CM | POA: Diagnosis not present

## 2016-10-07 NOTE — Patient Instructions (Signed)
3 Way Raises:      Starting Position:  Seated in chair with back against chair and sitting tall: Keep thumbs up to ceiling, elbows straight and shoulders relaxed/down throughout.  1. Lift arms in front to shoulder height 2. Lift arms a little wider into a "V" to shoulder height 3. Lift arms out to sides in a "T" to shoulder height  Perform 10 times in each direction. Hold 1-2 lbs to start with and work up to 2-3 sets of 10/day. Perform 3-4 times/week. Increase weight as able, decreasing sets of 10 each time you increase weights, then slowly working your way back up to 2-3 sets each time.    Start walking every for at least 10-15 minutes 1-2x/day, then increase as able  Try elliptical 1-2 mis/day  Cancer Rehab [phone]
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
## 2016-10-09 ENCOUNTER — Ambulatory Visit: Payer: Medicare Other

## 2016-10-11 ENCOUNTER — Ambulatory Visit: Payer: Medicare Other | Admitting: Physical Therapy

## 2016-10-11 ENCOUNTER — Encounter: Payer: Self-pay | Admitting: Physical Therapy

## 2016-10-11 DIAGNOSIS — M545 Low back pain: Secondary | ICD-10-CM | POA: Diagnosis not present

## 2016-10-11 DIAGNOSIS — M6281 Muscle weakness (generalized): Secondary | ICD-10-CM | POA: Diagnosis not present

## 2016-10-11 DIAGNOSIS — R5381 Other malaise: Secondary | ICD-10-CM

## 2016-10-11 DIAGNOSIS — R5383 Other fatigue: Secondary | ICD-10-CM | POA: Diagnosis not present
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
## 2016-10-14 ENCOUNTER — Telehealth: Payer: Self-pay | Admitting: *Deleted

## 2016-10-14 ENCOUNTER — Emergency Department (HOSPITAL_COMMUNITY)
Admission: EM | Admit: 2016-10-14 | Discharge: 2016-10-14 | Disposition: A | Discharge status: Home / Self Care | Payer: Medicare Other | Attending: Emergency Medicine | Admitting: Emergency Medicine

## 2016-10-14 ENCOUNTER — Emergency Department (HOSPITAL_COMMUNITY): Payer: Medicare Other

## 2016-10-14 ENCOUNTER — Encounter (HOSPITAL_COMMUNITY): Payer: Self-pay | Admitting: Nurse Practitioner

## 2016-10-14 DIAGNOSIS — R112 Nausea with vomiting, unspecified: Secondary | ICD-10-CM | POA: Diagnosis not present

## 2016-10-14 DIAGNOSIS — I129 Hypertensive chronic kidney disease with stage 1 through stage 4 chronic kidney disease, or unspecified chronic kidney disease: Secondary | ICD-10-CM | POA: Diagnosis not present

## 2016-10-14 DIAGNOSIS — R1084 Generalized abdominal pain: Secondary | ICD-10-CM | POA: Diagnosis not present

## 2016-10-14 DIAGNOSIS — R110 Nausea: Secondary | ICD-10-CM

## 2016-10-14 DIAGNOSIS — Z87891 Personal history of nicotine dependence: Secondary | ICD-10-CM | POA: Diagnosis not present

## 2016-10-14 DIAGNOSIS — J449 Chronic obstructive pulmonary disease, unspecified: Secondary | ICD-10-CM | POA: Diagnosis not present

## 2016-10-14 DIAGNOSIS — N189 Chronic kidney disease, unspecified: Secondary | ICD-10-CM | POA: Diagnosis not present

## 2016-10-14 DIAGNOSIS — I2510 Atherosclerotic heart disease of native coronary artery without angina pectoris: Secondary | ICD-10-CM | POA: Insufficient documentation

## 2016-10-14 DIAGNOSIS — Z955 Presence of coronary angioplasty implant and graft: Secondary | ICD-10-CM | POA: Diagnosis not present

## 2016-10-14 DIAGNOSIS — R079 Chest pain, unspecified: Secondary | ICD-10-CM | POA: Diagnosis not present

## 2016-10-14 LAB — CBC WITH DIFFERENTIAL/PLATELET
Basophils Absolute: 0 10*3/uL (ref 0.0–0.1)
Basophils Relative: 0 %
Eosinophils Absolute: 0 10*3/uL (ref 0.0–0.7)
Eosinophils Relative: 1 %
HCT: 39.4 % (ref 36.0–46.0)
Hemoglobin: 12.9 g/dL (ref 12.0–15.0)
Lymphocytes Relative: 12 %
Lymphs Abs: 0.5 10*3/uL — ABNORMAL LOW (ref 0.7–4.0)
MCH: 31.1 pg (ref 26.0–34.0)
MCHC: 32.7 g/dL (ref 30.0–36.0)
MCV: 94.9 fL (ref 78.0–100.0)
Monocytes Absolute: 0.3 10*3/uL (ref 0.1–1.0)
Monocytes Relative: 6 %
Neutro Abs: 3.5 10*3/uL (ref 1.7–7.7)
Neutrophils Relative %: 81 %
Platelets: 138 10*3/uL — ABNORMAL LOW (ref 150–400)
RBC: 4.15 MIL/uL (ref 3.87–5.11)
RDW: 13.7 % (ref 11.5–15.5)
WBC: 4.4 10*3/uL (ref 4.0–10.5)

## 2016-10-14 LAB — COMPREHENSIVE METABOLIC PANEL
ALT: 11 U/L — ABNORMAL LOW (ref 14–54)
AST: 25 U/L (ref 15–41)
Albumin: 4.2 g/dL (ref 3.5–5.0)
Alkaline Phosphatase: 59 U/L (ref 38–126)
Anion gap: 7 (ref 5–15)
BUN: 12 mg/dL (ref 6–20)
CO2: 28 mmol/L (ref 22–32)
Calcium: 9.4 mg/dL (ref 8.9–10.3)
Chloride: 105 mmol/L (ref 101–111)
Creatinine, Ser: 0.51 mg/dL (ref 0.44–1.00)
GFR calc Af Amer: >60 mL/min (ref >60)
GFR calc non Af Amer: >60 mL/min (ref >60)
Glucose, Bld: 155 mg/dL — ABNORMAL HIGH (ref 65–99)
Potassium: 3.7 mmol/L (ref 3.5–5.1)
Sodium: 140 mmol/L (ref 135–145)
Total Bilirubin: 0.6 mg/dL (ref 0.3–1.2)
Total Protein: 6.5 g/dL (ref 6.5–8.1)

## 2016-10-14 LAB — LIPASE, BLOOD: Lipase: 23 U/L (ref 11–51)

## 2016-10-14 MED ORDER — TRAMADOL HCL 50 MG PO TABS: 50.0000 mg | ORAL_TABLET | Freq: Four times a day (QID) | ORAL | 0 refills | Status: DC | PRN

## 2016-10-14 MED ORDER — SUCRALFATE 1 GM/10ML PO SUSP: 1.0000 g | Freq: Three times a day (TID) | ORAL | 0 refills | Status: DC

## 2016-10-14 MED ORDER — IOPAMIDOL (ISOVUE-300) INJECTION 61%
INTRAVENOUS | Status: AC
Start: 2016-10-14 — End: 2016-10-14
  Administered 2016-10-14: 100 mL
  Filled 2016-10-14: qty 100

## 2016-10-14 MED ORDER — MORPHINE SULFATE (PF) 4 MG/ML IV SOLN
4.0000 mg | Freq: Once | INTRAVENOUS | Status: AC
  Administered 2016-10-14: 4 mg via INTRAVENOUS
  Filled 2016-10-14: qty 1

## 2016-10-14 MED ORDER — DICYCLOMINE HCL 20 MG PO TABS: 20.0000 mg | ORAL_TABLET | Freq: Two times a day (BID) | ORAL | 0 refills | Status: DC

## 2016-10-14 MED ORDER — ONDANSETRON HCL 4 MG/2ML IJ SOLN
4.0000 mg | Freq: Once | INTRAMUSCULAR | Status: DC
  Filled 2016-10-14: qty 2

## 2016-10-14 MED ORDER — SODIUM CHLORIDE 0.9 % IV BOLUS (SEPSIS)
500.0000 mL | Freq: Once | INTRAVENOUS | Status: AC
  Administered 2016-10-14: 500 mL via INTRAVENOUS

## 2016-10-14 NOTE — ED Notes (Signed)
Bed: BW38 Expected date:  Expected time:  Means of arrival:  Comments: EMs abd pain

## 2016-10-14 NOTE — ED Notes (Signed)
Patient transported to CT 

## 2016-10-14 NOTE — Telephone Encounter (Signed)
I agree with ED evaluation. Thank you.

## 2016-10-14 NOTE — Discharge Instructions (Signed)
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
## 2016-10-14 NOTE — Telephone Encounter (Signed)
Call from pt reporting intermittent abdominal pain 6/10 that began 1 month ago. She does not notice a pattern. Reports some constipation, takes Miralax PRN. Had soft BM today. She also reports weight loss, intermittent nausea, without vomiting. Reports "only phlegm comes up."  Recommended pt contact PCP to discuss above symptoms, have them contact Dr. Julien Nordmann if oncology related. She stated she will go to ED for evaluation. "Something is going on in my body to cause this." Message to Dr. Julien Nordmann for review.

## 2016-10-14 NOTE — ED Triage Notes (Signed)
Pt presents to [facility] via [facility] from home for complaints for recurrent abdominal pain rated as 7/10. She describes the pain as sharp and generalized in location. She denies vomiting or diarrhea. She is a cancer pt and has not had recent radiation or chemo. She believes that she has an upcoming CT scan to determine current cancer status.

## 2016-10-15 ENCOUNTER — Encounter (HOSPITAL_COMMUNITY): Payer: Self-pay | Admitting: Emergency Medicine

## 2016-10-15 ENCOUNTER — Emergency Department (HOSPITAL_COMMUNITY)
Admission: EM | Admit: 2016-10-15 | Discharge: 2016-10-15 | Disposition: A | Discharge status: Home / Self Care | Payer: Medicare Other | Attending: Emergency Medicine | Admitting: Emergency Medicine

## 2016-10-15 DIAGNOSIS — R1084 Generalized abdominal pain: Secondary | ICD-10-CM | POA: Diagnosis not present

## 2016-10-15 DIAGNOSIS — R531 Weakness: Secondary | ICD-10-CM | POA: Diagnosis not present

## 2016-10-15 DIAGNOSIS — N189 Chronic kidney disease, unspecified: Secondary | ICD-10-CM | POA: Insufficient documentation

## 2016-10-15 DIAGNOSIS — Z87891 Personal history of nicotine dependence: Secondary | ICD-10-CM | POA: Diagnosis not present

## 2016-10-15 DIAGNOSIS — J449 Chronic obstructive pulmonary disease, unspecified: Secondary | ICD-10-CM | POA: Insufficient documentation

## 2016-10-15 DIAGNOSIS — Z79899 Other long term (current) drug therapy: Secondary | ICD-10-CM | POA: Insufficient documentation

## 2016-10-15 DIAGNOSIS — I129 Hypertensive chronic kidney disease with stage 1 through stage 4 chronic kidney disease, or unspecified chronic kidney disease: Secondary | ICD-10-CM | POA: Diagnosis not present

## 2016-10-15 DIAGNOSIS — Z955 Presence of coronary angioplasty implant and graft: Secondary | ICD-10-CM | POA: Diagnosis not present

## 2016-10-15 DIAGNOSIS — R002 Palpitations: Secondary | ICD-10-CM | POA: Diagnosis not present

## 2016-10-15 DIAGNOSIS — I2510 Atherosclerotic heart disease of native coronary artery without angina pectoris: Secondary | ICD-10-CM | POA: Insufficient documentation

## 2016-10-15 DIAGNOSIS — Z7902 Long term (current) use of antithrombotics/antiplatelets: Secondary | ICD-10-CM | POA: Insufficient documentation

## 2016-10-15 DIAGNOSIS — R55 Syncope and collapse: Secondary | ICD-10-CM | POA: Diagnosis not present

## 2016-10-15 DIAGNOSIS — Z85118 Personal history of other malignant neoplasm of bronchus and lung: Secondary | ICD-10-CM | POA: Diagnosis not present

## 2016-10-15 DIAGNOSIS — R109 Unspecified abdominal pain: Secondary | ICD-10-CM

## 2016-10-15 DIAGNOSIS — R404 Transient alteration of awareness: Secondary | ICD-10-CM | POA: Diagnosis not present

## 2016-10-15 DIAGNOSIS — I252 Old myocardial infarction: Secondary | ICD-10-CM | POA: Diagnosis not present

## 2016-10-15 LAB — URINALYSIS, ROUTINE W REFLEX MICROSCOPIC
Bilirubin Urine: NEGATIVE
Glucose, UA: NEGATIVE mg/dL
Ketones, ur: NEGATIVE mg/dL
Leukocytes, UA: NEGATIVE
Nitrite: NEGATIVE
Protein, ur: NEGATIVE mg/dL
Specific Gravity, Urine: 1.002 — ABNORMAL LOW (ref 1.005–1.030)
pH: 6 (ref 5.0–8.0)

## 2016-10-15 MED ORDER — SODIUM CHLORIDE 0.9 % IV BOLUS (SEPSIS)
1000.0000 mL | Freq: Once | INTRAVENOUS | Status: AC
  Administered 2016-10-15: 1000 mL via INTRAVENOUS

## 2016-10-15 MED ORDER — SUCRALFATE 1 G PO TABS: 1.0000 g | ORAL_TABLET | Freq: Three times a day (TID) | ORAL | 0 refills | Status: DC

## 2016-10-15 MED ORDER — PANTOPRAZOLE SODIUM 20 MG PO TBEC: 20.0000 mg | DELAYED_RELEASE_TABLET | Freq: Every day | ORAL | 0 refills | Status: DC

## 2016-10-15 MED ORDER — DIPHENHYDRAMINE HCL 25 MG PO CAPS
25.0000 mg | ORAL_CAPSULE | Freq: Once | ORAL | Status: AC
  Administered 2016-10-15: 25 mg via ORAL
  Filled 2016-10-15: qty 1

## 2016-10-15 MED ORDER — METOCLOPRAMIDE HCL 5 MG/ML IJ SOLN
10.0000 mg | Freq: Once | INTRAMUSCULAR | Status: AC
  Administered 2016-10-15: 10 mg via INTRAVENOUS
  Filled 2016-10-15: qty 2
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
## 2016-10-15 NOTE — ED Provider Notes (Signed)
Patient called back to stay that the Bentyl, which she was prescribed, made her heart race when she took it.  Also, she could not fill the Carafate, because her insurance would not pay for it.  She has filled the tramadol prescription but not tried taking it, yet.  I asked the secretary to advise her to use Maalox before meals and at bedtime, instead of the Carafate.  Also to decrease heart rate, and drink 2 L of water each day.    If this change does not help her symptoms, she is instructed to follow-up with her doctor, or return here, if needed.   Daleen Bo, MD 10/15/16 [phone]

## 2016-10-15 NOTE — ED Notes (Signed)
Pt given ice water and graham crackers, per Gillis Santa, RN.

## 2016-10-15 NOTE — ED Notes (Signed)
Pt does not want Reglan-- concerned that it will cause palpitations again./

## 2016-10-15 NOTE — ED Triage Notes (Signed)
To ED via [facility] from home with pt c/o continued abd pain, and palpitations since starting medicine form [facility] ED yesterday.

## 2016-10-16 ENCOUNTER — Telehealth: Payer: Self-pay | Admitting: *Deleted

## 2016-10-16 DIAGNOSIS — L720 Epidermal cyst: Secondary | ICD-10-CM | POA: Diagnosis not present

## 2016-10-16 DIAGNOSIS — R229 Localized swelling, mass and lump, unspecified: Secondary | ICD-10-CM | POA: Diagnosis not present

## 2016-10-16 NOTE — Telephone Encounter (Signed)
Pt called regardng a nodule on arm. Pt advised she saw a dermatologist and he would like to surgically remove the nodule in August. Pt advised she cannot wait that long, she has made an appt for a 2nd opinion with new dermatology. Pt also in ED twice this week with abdominal pains, has an appt with GI on Monday. All concerns addressed with pt and questions answered. Advised pt I will notify MD of above concerns. No further conerns at this time.

## 2016-10-18 ENCOUNTER — Ambulatory Visit: Payer: Medicare Other | Admitting: Physical Therapy

## 2016-10-18 DIAGNOSIS — R5381 Other malaise: Secondary | ICD-10-CM

## 2016-10-18 DIAGNOSIS — M6281 Muscle weakness (generalized): Secondary | ICD-10-CM | POA: Diagnosis not present

## 2016-10-18 DIAGNOSIS — M545 Low back pain: Secondary | ICD-10-CM | POA: Diagnosis not present

## 2016-10-18 DIAGNOSIS — R5383 Other fatigue: Secondary | ICD-10-CM
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
## 2016-10-18 NOTE — Patient Instructions (Signed)

## 2016-10-19 ENCOUNTER — Other Ambulatory Visit: Payer: Self-pay | Admitting: Cardiology

## 2016-10-21 ENCOUNTER — Telehealth: Payer: Self-pay | Admitting: Physician Assistant

## 2016-10-21 ENCOUNTER — Encounter: Payer: Self-pay | Admitting: Physician Assistant

## 2016-10-21 ENCOUNTER — Encounter: Payer: Self-pay | Admitting: Physical Therapy

## 2016-10-21 ENCOUNTER — Ambulatory Visit (INDEPENDENT_AMBULATORY_CARE_PROVIDER_SITE_OTHER): Payer: Medicare Other | Admitting: Physician Assistant

## 2016-10-21 VITALS — BP 120/60 | HR 75 | Ht 65.5 in | Wt 119.2 lb

## 2016-10-21 DIAGNOSIS — K5900 Constipation, unspecified: Secondary | ICD-10-CM

## 2016-10-21 DIAGNOSIS — R634 Abnormal weight loss: Secondary | ICD-10-CM

## 2016-10-21 DIAGNOSIS — R109 Unspecified abdominal pain: Secondary | ICD-10-CM

## 2016-10-21 DIAGNOSIS — I208 Other forms of angina pectoris: Secondary | ICD-10-CM | POA: Diagnosis not present

## 2016-10-21 DIAGNOSIS — Z85118 Personal history of other malignant neoplasm of bronchus and lung: Secondary | ICD-10-CM | POA: Diagnosis not present

## 2016-10-21 DIAGNOSIS — R1013 Epigastric pain: Secondary | ICD-10-CM
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
## 2016-10-21 NOTE — Patient Instructions (Signed)
If you are age 63 or older, your body mass index should be between 23-30. Your Body mass index is 19.54 kg/m. If this is out of the aforementioned range listed, please consider follow up with your Primary Care Provider.  If you are age 63 or younger, your body mass index should be between 19-25. Your Body mass index is 19.54 kg/m. If this is out of the aformentioned range listed, please consider follow up with your Primary Care Provider.   You have been scheduled for an Upper GI Series and Small Bowel Follow Thru at [facility]. Your appointment is on May 29th at 9:00am. Please arrive 15 minutes prior to your test for registration. Make certain not to have anything to eat or drink after midnight on the night before your test. If you need to reschedule, please contact radiology at [phone]. --------------------------------------------------------------------------------------------------------------- An upper GI series uses x rays to help diagnose problems of the upper GI tract, which includes the esophagus, stomach, and duodenum. The duodenum is the first part of the small intestine. An upper GI series is conducted by a radiology technologist or a radiologist-a doctor who specializes in x-ray imaging-at a hospital or outpatient center. While sitting or standing in front of an x-ray machine, the patient drinks barium liquid, which is often white and has a chalky consistency and taste. The barium liquid coats the lining of the upper GI tract and makes signs of disease show up more clearly on x rays. X-ray video, called fluoroscopy, is used to view the barium liquid moving through the esophagus, stomach, and duodenum. Additional x rays and fluoroscopy are performed while the patient lies on an x-ray table. To fully coat the upper GI tract with barium liquid, the technologist or radiologist may press on the abdomen or ask the patient to change position. Patients hold still in various positions,  allowing the technologist or radiologist to take x rays of the upper GI tract at different angles. If a technologist conducts the upper GI series, a radiologist will later examine the images to look for problems.  This test typically takes about 1 hour to complete --------------------------------------------------------------------------------------------------------------------------------------------- The Small Bowel Follow Thru examination is used to visualize the entire small bowel (intestines); specifically the connection between the small and large intestine. You will be positioned on a flat x-ray table and an image of your abdomen taken. Then the technologist will show the x-ray to the radiologist. The radiologist will instruct your technologist how much (1-2 cups) barium sulfate you will drink and when to begin taking the timed x-rays, usually 15-30 minutes after you begin drinking. Barium is a harmless substance that will highlight your small intestine by absorbing x-ray. The taste is chalky and it feels very heavy both in the cup and in your stomach.  After the first x-ray is taken and shown to the radiologist, he/she will determine when the next image is to be taken. This is repeated until the barium has reached the end of the small intestine and enters the beginning of the colon (cecum). At such time when the barium spills into the colon, you will be positioned on the x-ray table once again. The radiologist will use a fluoroscopic camera to take some detailed pictures of the connection between your small intestine and colon. The fluoroscope is an x-ray unit that works with a television/computer screen. The radiologist will apply pressure to your abdomen with his/her hand and a lead glove, a plastic paddle, or a paddle with an inflated  rubber balloon on the end. This is to spread apart your loops of intestine so he/she can see all areas.   This test typically takes around 1 hour to  complete.  Important-- Drink plenty of water (8-10 cups/day) for a few days following the procedure to avoid constipation and blockage. The barium will make your stools white for a few days. --------------------------------------------------------------------------------------------------------------------------------------------  Restart Laxatives.  Continue Carafate and Omeprazole.  You will be contacted by Nutrition with an appointment..  You have been given a high fiber diet to follow.  Please follow up with Ellouise Newer, PA on Monday, June 25th at 2:45pm. If this appointment date and time does not work please call office to reschedule.  Thank you.

## 2016-10-21 NOTE — Telephone Encounter (Signed)
Michele [name], Can you please instruct pt since you were explaining them with her at the appt? Thank you.

## 2016-10-21 NOTE — Telephone Encounter (Signed)
Rx request sent to pharmacy.  

## 2016-10-21 NOTE — Telephone Encounter (Signed)
Patient wanted to clarify that taking Carafate 1 hour before meals and/or other medicines would not cause a problem with her other medicines.  I reiterated that by taking it one hour before it would ensure that it would NOT interact with her other medications.  Patient acknowledged and understood.  Patient also wanted to change her f/u with [facility] to a different day and an earlier time so her boyfriend who brings her would not be late to work.  Appointment rescheduled for 11/18/2016 at 11:15am.  Patient agreed

## 2016-10-21 NOTE — Progress Notes (Signed)
I agree with the above note, plan 

## 2016-10-22 ENCOUNTER — Telehealth: Payer: Self-pay | Admitting: Physician Assistant

## 2016-10-22 MED ORDER — PANTOPRAZOLE SODIUM 20 MG PO TBEC: 20.0000 mg | DELAYED_RELEASE_TABLET | Freq: Two times a day (BID) | ORAL | 6 refills | Status: DC

## 2016-10-22 MED ORDER — GI COCKTAIL ~~LOC~~: ORAL | 1 refills | Status: DC

## 2016-10-22 NOTE — Telephone Encounter (Signed)
She should discontinue this medication if she is unable to take it per timing discussed as I believe it may be hindering absorption of nutrients and she is trying to gain weight. We can prescribe Gi cocktail (whatever formula you use is fine) 5-10 ml q4-6 hours and please ask what does of PPI she is on (there was some confusion about this at appt)-if we can increase this to twice daily dosing that would be great. Thanks-JLL

## 2016-10-22 NOTE — Telephone Encounter (Signed)
Patient notified of recommendations rx sent

## 2016-10-22 NOTE — Telephone Encounter (Signed)
Patient reports that she had diarrhea, abdominal cramping, and headache after taking carafate last night.  She changed the way she was taking it to 1 hour AC meals. She is not able to take the dicyclomine because it causes "heart palpitations".  Please advise

## 2016-10-23 ENCOUNTER — Telehealth: Payer: Self-pay | Admitting: Physician Assistant

## 2016-10-23 ENCOUNTER — Encounter: Payer: Self-pay | Admitting: Physical Therapy

## 2016-10-23 DIAGNOSIS — H838X3 Other specified diseases of inner ear, bilateral: Secondary | ICD-10-CM | POA: Diagnosis not present

## 2016-10-23 DIAGNOSIS — H6983 Other specified disorders of Eustachian tube, bilateral: Secondary | ICD-10-CM | POA: Diagnosis not present

## 2016-10-23 DIAGNOSIS — H7203 Central perforation of tympanic membrane, bilateral: Secondary | ICD-10-CM | POA: Diagnosis not present

## 2016-10-24 ENCOUNTER — Other Ambulatory Visit: Payer: Self-pay | Admitting: Cardiology

## 2016-10-24 MED ORDER — PANTOPRAZOLE SODIUM 40 MG PO TBEC: 40.0000 mg | DELAYED_RELEASE_TABLET | Freq: Two times a day (BID) | ORAL | 6 refills | Status: DC

## 2016-10-24 NOTE — Telephone Encounter (Signed)
Talked to [facility] and she stated that patient should be on 40 mg twice a day. Patient was informed and new Rx for pantoprazole sent to pharmacy.40 mg twice daily #60 with 6 refills

## 2016-10-24 NOTE — Telephone Encounter (Signed)
°*  STAT* If patient is at the pharmacy, call can be transferred to refill team.   1. Which medications need to be refilled? (please list name of each medication and dose if known) Spiriva  2. Which pharmacy/location (including street and city if local pharmacy) is medication to be sent to?[facility] [phone]  3. Do they need a 30 day or 90 day supply?

## 2016-10-25 ENCOUNTER — Ambulatory Visit: Payer: Self-pay | Admitting: Physical Therapy

## 2016-10-25 NOTE — Telephone Encounter (Signed)
Will forward to dr harding for okay to refill

## 2016-10-27 ENCOUNTER — Other Ambulatory Visit: Payer: Self-pay | Admitting: Cardiology

## 2016-10-29 ENCOUNTER — Telehealth: Payer: Self-pay

## 2016-10-29 ENCOUNTER — Ambulatory Visit (HOSPITAL_COMMUNITY)
Admission: RE | Admit: 2016-10-29 | Discharge: 2016-10-29 | Disposition: A | Discharge status: Home / Self Care | Payer: Medicare Other | Source: Ambulatory Visit | Attending: Physician Assistant | Admitting: Physician Assistant

## 2016-10-29 DIAGNOSIS — R634 Abnormal weight loss: Secondary | ICD-10-CM

## 2016-10-29 DIAGNOSIS — I878 Other specified disorders of veins: Secondary | ICD-10-CM | POA: Insufficient documentation

## 2016-10-29 DIAGNOSIS — K219 Gastro-esophageal reflux disease without esophagitis: Secondary | ICD-10-CM | POA: Insufficient documentation

## 2016-10-29 DIAGNOSIS — K228 Other specified diseases of esophagus: Secondary | ICD-10-CM | POA: Diagnosis not present

## 2016-10-29 DIAGNOSIS — R109 Unspecified abdominal pain: Secondary | ICD-10-CM

## 2016-10-29 DIAGNOSIS — K5900 Constipation, unspecified: Secondary | ICD-10-CM | POA: Diagnosis not present

## 2016-10-29 DIAGNOSIS — R1013 Epigastric pain: Secondary | ICD-10-CM | POA: Diagnosis not present

## 2016-10-29 NOTE — Telephone Encounter (Signed)
Pt had UGI done today. She has GERD. They did increase protonix to 40 mg BID. 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  She has read that other symptoms that go along with esophagus cancer are extra skin on your hands. She has something on her hand that she originally thought was a wart now it has extra skin.  She has 2 nodules on her left arm. She does have appt with dermatologist on Friday in [place].  She has lost more weight even with eating. She has been coughing up more phlegm in the last month.  Next OV at [facility] is 6/26 lab and CT chest, 6/28 Dr Julien Nordmann.

## 2016-10-30 ENCOUNTER — Ambulatory Visit: Payer: Medicare Other | Admitting: Physical Therapy

## 2016-10-30 NOTE — Telephone Encounter (Signed)
Per dr [name] - prescription to be filled by primary dr Myer Haff to patient -- patient states she  Prescription was filled by Dr Kenton Kingfisher office. Next recall appointment not until sept  2018 Patient verbalized understanding.

## 2016-10-31 ENCOUNTER — Encounter: Payer: Self-pay | Admitting: Pulmonary Disease

## 2016-10-31 ENCOUNTER — Ambulatory Visit (INDEPENDENT_AMBULATORY_CARE_PROVIDER_SITE_OTHER): Payer: Medicare Other | Admitting: Pulmonary Disease

## 2016-10-31 VITALS — BP 104/60 | HR 68 | Ht 65.5 in | Wt 116.0 lb

## 2016-10-31 DIAGNOSIS — I208 Other forms of angina pectoris: Secondary | ICD-10-CM | POA: Diagnosis not present

## 2016-10-31 DIAGNOSIS — J432 Centrilobular emphysema: Secondary | ICD-10-CM

## 2016-10-31 NOTE — Progress Notes (Signed)
Subjective:     Patient ID: Michele [name], female   DOB: August 12, 1955, 61 y.o.   MRN: [id number]  HPI   Review of Systems  Constitutional: Positive for unexpected weight change. Negative for fever.  HENT: Positive for sore throat and trouble swallowing. Negative for congestion, dental problem, ear pain, nosebleeds, postnasal drip, rhinorrhea, sinus pressure and sneezing.   Eyes: Negative for redness and itching.  Respiratory: Positive for cough and shortness of breath. Negative for chest tightness and wheezing.   Cardiovascular: Positive for chest pain and palpitations. Negative for leg swelling.  Gastrointestinal: Positive for abdominal pain. Negative for nausea and vomiting.       Indigestion / acid heartburn  Genitourinary: Negative for dysuria.  Musculoskeletal: Negative for joint swelling.  Skin: Negative for rash.  Neurological: Positive for headaches.  Hematological: Does not bruise/bleed easily.  Psychiatric/Behavioral: Positive for dysphoric mood. The patient is nervous/anxious.        Objective:   Physical Exam     Assessment:         Plan:

## 2016-10-31 NOTE — Progress Notes (Signed)
Past surgical history She  has a past surgical history that includes Leg wound repair / closure (6659); Coronary angioplasty with stent (10/10/11); Coronary angioplasty with stent (11/06/11); Tonsillectomy; Tubal ligation (1970's); Knee surgery; Coronary angioplasty with stent (11/19/2012); doppler echocardiography (May 2013; September 2015); NM MYOVIEW LTD (October 2013; 12/2013); CPET (09/07/2012); Cardiac catheterization (03/02/2014); left heart catheterization with coronary angiogram (N/A, 10/10/2011); percutaneous coronary stent intervention (pci-s) (N/A, 11/06/2011); left heart catheterization with coronary angiogram (N/A, 11/19/2012); left heart catheterization with coronary angiogram (N/A, 02/19/2013); left heart catheterization with coronary angiogram (N/A, 03/02/2014); Colonoscopy; Polypectomy; Video bronchoscopy with endobronchial ultrasound (N/A, 02/14/2016); Direct laryngoscopy (N/A, 02/14/2016); OTHER SURGICAL HISTORY; Breast biopsy (2000's); and NM MYOVIEW LTD (02/2016).  Family history Her family history includes Asthma in her mother; Cancer in her maternal grandmother; Colon polyps in her mother; Diabetes in her mother; Emphysema in her mother; Heart attack in her maternal grandfather; Heart disease in her father and mother; Hyperlipidemia in her mother; Hypertension in her mother; Kidney cancer in her brother; Stomach cancer in her brother and brother; Stroke in her mother; Stroke (age of onset: 60) in her brother.  Social history She  reports that she quit smoking about 8 months ago. Her smoking use included Cigarettes. She has a 60.00 pack-year smoking history. She has never used smokeless tobacco. She reports that she does not drink alcohol or use drugs.  Allergies  Allergen Reactions  . Ciprofloxacin Other (See Comments)    hypoglycemia  . Aspirin Other (See Comments)     GI upset (takes low dose aspirin at night) - Stomach ache - No stomach bleed Pt can take enteric coated  . Crestor  [Rosuvastatin] Other (See Comments)    Myalgias   . Ibuprofen Other (See Comments)    GI upset  . Wellbutrin [Bupropion] Palpitations  . Lipitor [Atorvastatin] Other (See Comments)    Myalgias   . Sulfonamide Derivatives Itching and Rash    Review of Systems  Constitutional: Positive for unexpected weight change. Negative for fever.  HENT: Positive for sore throat and trouble swallowing. Negative for congestion, dental problem, ear pain, nosebleeds, postnasal drip, rhinorrhea, sinus pressure and sneezing.   Eyes: Negative for redness and itching.  Respiratory: Positive for cough and shortness of breath. Negative for chest tightness and wheezing.   Cardiovascular: Positive for chest pain and palpitations. Negative for leg swelling.  Gastrointestinal: Positive for abdominal pain. Negative for nausea and vomiting.       Indigestion / acid heartburn  Genitourinary: Negative for dysuria.  Musculoskeletal: Negative for joint swelling.  Skin: Negative for rash.  Neurological: Positive for headaches.  Hematological: Does not bruise/bleed easily.  Psychiatric/Behavioral: Positive for dysphoric mood. The patient is nervous/anxious.     Current Outpatient Prescriptions on File Prior to Visit  Medication Sig  . Alum & Mag Hydroxide-Simeth (GI COCKTAIL) SUSP suspension Take 5-10 ml q for to 6 hours as needed for abdominal pain  . Calcium Carbonate-Vitamin D (CALCIUM 600+D PO) Take 1 tablet by mouth daily.   . carvedilol (COREG) 6.25 MG tablet Take 1 tablet (6.25 mg total) by mouth 2 (two) times daily with a meal.  . clonazePAM (KLONOPIN) 2 MG tablet Take 2 mg by mouth 3 (three) times daily as needed for anxiety.   . clopidogrel (PLAVIX) 75 MG tablet TAKE 1 TABLET(75 MG) BY MOUTH DAILY  . Coenzyme Q10 (COQ10) 100 MG CAPS Take 100 mg by mouth daily. Reported on 10/16/2015  . dicyclomine (BENTYL) 20 MG tablet Take 1 tablet (  20 mg total) by mouth 2 (two) times daily.  . fluticasone (FLONASE) 50  MCG/ACT nasal spray Place 2 sprays into both nostrils every morning.  . isosorbide mononitrate (IMDUR) 30 MG 24 hr tablet TAKE 1 TABLET(30 MG) BY MOUTH AT BEDTIME  . magic mouthwash SOLN Take 5 mLs by mouth 4 (four) times daily.  [name] neomycin-polymyxin b-dexamethasone (MAXITROL) 3.5-10000-0.1 OINT Place 1 application into both eyes at bedtime.   . nitroGLYCERIN (NITROSTAT) 0.4 MG SL tablet DISSOLVE 1 TABLET UNDER THE TONGUE EVERY 5 MINUTES AS NEEDED FOR CHEST PAIN  . pantoprazole (PROTONIX) 40 MG tablet Take 1 tablet (40 mg total) by mouth 2 (two) times daily before a meal.  . Polyethyl Glycol-Propyl Glycol (SYSTANE OP) Place 2 drops into both eyes 4 (four) times daily.  . polyethylene glycol (MIRALAX / GLYCOLAX) packet Take 17 g by mouth daily.  . pravastatin (PRAVACHOL) 40 MG tablet TAKE 1 TABLET(40 MG) BY MOUTH EVERY EVENING  . sennosides-docusate sodium (SENOKOT-S) 8.6-50 MG tablet Take 1 tablet by mouth 2 (two) times daily as needed for constipation.   [name] tiotropium (SPIRIVA) 18 MCG inhalation capsule Place 18 mcg into inhaler and inhale daily.  . traMADol (ULTRAM) 50 MG tablet Take 1 tablet (50 mg total) by mouth every 6 (six) hours as needed.  [name] VASCEPA 1 g CAPS TAKE 1 CAPSULES BY MOUTH TWICE DAILY (Patient taking differently: TAKE 1 CAPSULES BY MOUTH DAILY)  . VENTOLIN HFA 108 (90 Base) MCG/ACT inhaler Inhale 2 puffs into the lungs every 4 (four) hours as needed for wheezing or shortness of breath.    Current Facility-Administered Medications on File Prior to Visit  Medication  . HYDROcodone-acetaminophen (NORCO/VICODIN) 5-325 MG per tablet 1 tablet    Chief Complaint  Patient presents with  . PULMONARY CONSULT    Self Referral - recent lung cancer (completed chemo 05/2016 / Radiation 06/2016) Pt states that her breathing has not been doing good. Pt c/o left lung pain and increased SOB.     Pulmonary tests PFT 12/23/11 >> FEV1 2.13 (85%), FEV1% 59, TLC 5.98 (112%), DLCO 60% CT chest  08/24/16 >> CAD, mod/severe emphysema  Cardiac tests Echo 12/19/13 >> EF 65 to 70%  Past medical history She  has a past medical history of Anginal pain ([place]); ANXIETY; Arthritis; Asthma; Borderline hypertension; CAD S/P percutaneous coronary angioplasty (10/2011, 11/2011; 11/20/2012); Cataract; Chronic kidney disease; Collagen vascular disease ([place]); CONTACT DERMATITIS&OTHER ECZEMA DUE UNSPEC CAUSE; COPD; DEPRESSION; DERMATOFIBROMA; DYSLIPIDEMIA; Dysrhythmia; Emphysema of lung ([place]); Encounter for antineoplastic chemotherapy (03/12/2016); GERD; Hepatitis; Hiatal hernia; History of nuclear stress test (03/03/2012); History of radiation therapy (03/19/16- 05/06/16); History of radiation therapy (06/25/16- 07/08/16); History ST elevation myocardial infarction (STEMI) of inferolateral wall (10/2011); Hypertension; INSOMNIA; KNEE PAIN, CHRONIC; LOW BACK PAIN; RESTLESS LEG SYNDROME; Seizures ([place]); Shortness of breath dyspnea; 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  Vital signs BP 104/60 (BP Location: Left Arm, Cuff Size: Normal)   Pulse 68   Ht 5' 5.5" (1.664 m)   Wt 116 lb (52.6 kg)   SpO2 98%   BMI 19.01 kg/m   History of present illness Michele [name] is a 61 y.o. female former smoker with COPD and lung cancer.  She made this appointment on her own.  She quit smoking recently.  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  She is followed by  Dr. Julien Nordmann and Dr. Isidore Moos.  She has noticed trouble with her swallowing since having radiation therapy.  She gets winded with activity.  She gets a cough that is sometimes productive.  Not much wheezing.  Denies recent fever or hemoptysis.  Denies leg swelling or skin rash.    PFT from 2013 showed mild obstruction and diffusion defect.  CT chest from March 2018 showed emphysema.  She  has been using spiriva.  Physical exam  General - No distress ENT - No sinus tenderness, no oral exudate, no LAN, no thyromegaly, TM clear, pupils equal/reactive Cardiac - s1s2 regular, no murmur, pulses symmetric Chest - No wheeze/rales/dullness, good air entry, normal respiratory excursion Back - No focal tenderness Abd - Soft, non-tender, no organomegaly, + bowel sounds Ext - No edema Neuro - Normal strength, cranial nerves intact Skin - No rashes Psych - Normal mood, and behavior   CMP Latest Ref Rng & Units 10/14/2016 08/24/2016 08/09/2016  Glucose 65 - 99 mg/dL 155(H) 140(H) 129  BUN 6 - 20 mg/dL 12 10 14.6  Creatinine 0.44 - 1.00 mg/dL 0.51 0.55 0.7  Sodium 135 - 145 mmol/L 140 141 144  Potassium 3.5 - 5.1 mmol/L 3.7 4.0 4.1  Chloride 101 - 111 mmol/L 105 107 -  CO2 22 - 32 mmol/L 28 26 28   Calcium 8.9 - 10.3 mg/dL 9.4 9.6 9.7  Total Protein 6.5 - 8.1 g/dL 6.5 6.0(L) 6.6  Total Bilirubin 0.3 - 1.2 mg/dL 0.6 0.4 0.36  Alkaline Phos 38 - 126 U/L 59 54 64  AST 15 - 41 U/L 25 28 25   ALT 14 - 54 U/L 11(L) 13(L) 10     CBC Latest Ref Rng & Units 10/14/2016 08/24/2016 08/09/2016  WBC 4.0 - 10.5 K/uL 4.4 5.2 4.7  Hemoglobin 12.0 - 15.0 g/dL 12.9 13.2 13.9  Hematocrit 36.0 - 46.0 % 39.4 40.4 41.5  Platelets 150 - 400 K/uL 138(L) 143(L) 146     ABG    Component Value Date/Time   HCO3 28.1 (H) 08/05/2007 1826   TCO2 31 03/14/2016 2338    Discussion 61 yo female former smoker with COPD/emphysema and SCLC.   Assessment/plan  COPD/emphysema. - will have her try trelegy in place of spiriva - will need to discuss pneumococcal vaccinatio, alpha 1 level, and pulmonary rehab at next visit  Small cell lung cancer. - f/u with oncology and radiation oncology  Dysphagia after radiation therapy. - advised her to d/w radiation oncology   Patient Instructions  Trelegy one puff daily > rinse your mouth after each use  Don't use spiriva while using trelegy  Follow up in 6  weeks with Dr. Halford Chessman or Nurse Practitioner     [name], MD [facility] Pager:  [phone] 10/31/2016, 3:47 PM

## 2016-10-31 NOTE — Patient Instructions (Signed)
Trelegy one puff daily > rinse your mouth after each use  Don't use spiriva while using trelegy  Follow up in 6 weeks with Dr. Halford Chessman or Nurse Practitioner

## 2016-11-01 ENCOUNTER — Encounter: Payer: Self-pay | Admitting: Physical Therapy

## 2016-11-01 DIAGNOSIS — D2262 Melanocytic nevi of left upper limb, including shoulder: Secondary | ICD-10-CM | POA: Diagnosis not present

## 2016-11-01 DIAGNOSIS — D1722 Benign lipomatous neoplasm of skin and subcutaneous tissue of left arm: Secondary | ICD-10-CM | POA: Diagnosis not present

## 2016-11-01 DIAGNOSIS — D2272 Melanocytic nevi of left lower limb, including hip: Secondary | ICD-10-CM | POA: Diagnosis not present

## 2016-11-01 DIAGNOSIS — D2261 Melanocytic nevi of right upper limb, including shoulder: Secondary | ICD-10-CM | POA: Diagnosis not present

## 2016-11-01 DIAGNOSIS — D225 Melanocytic nevi of trunk: Secondary | ICD-10-CM | POA: Diagnosis not present

## 2016-11-01 DIAGNOSIS — D485 Neoplasm of uncertain behavior of skin: Secondary | ICD-10-CM | POA: Diagnosis not present

## 2016-11-06 ENCOUNTER — Telehealth: Payer: Self-pay | Admitting: Pulmonary Disease

## 2016-11-06 NOTE — Telephone Encounter (Signed)
Have her stop trelegy and resume spiriva

## 2016-11-06 NOTE — Telephone Encounter (Signed)
Spoke with the pt  She states that she is noticing her heart racing more since starting on Trelegy  She states that this was occurring some before starting med, but has gotten worse  She states is usually occurs when she lies down  She is not having any CP or other co's She states that around the same time she started on Trelegy, her protonix was doubled to 80 mg  She is unsure which med is causing this issue  Please advise thanks!

## 2016-11-06 NOTE — Telephone Encounter (Signed)
Spoke with patient regarding VS' recs. She stated she will stop the Trelegy today and will resume Spiriva. Asked patient if she had enough of the Spiriva and she said yes. Told patient to call us back if the "fast heart beat" continues after stopping the Trelegy. She verbalized understanding.

## 2016-11-12 IMAGING — DX DG HUMERUS 2V *L*
2 series · 2 of 2 positions shown · non-contrast
Comparison: None.

ADDENDUM:
Clinical data  should read:  [place] skating [place].
CLINICAL DATA: Patient fell at scanned ring

EXAM:
LEFT HUMERUS - 2+ VIEW

[humerus ap]
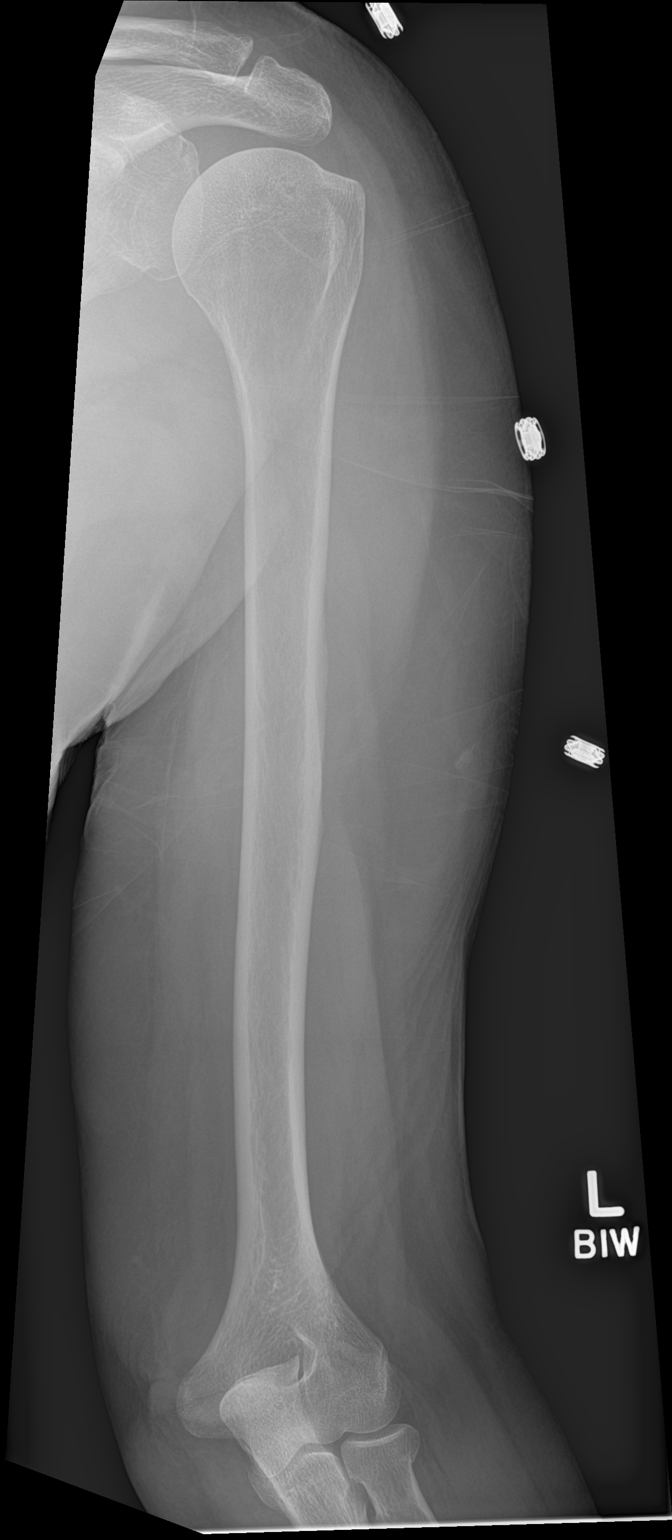

[humerus lat]
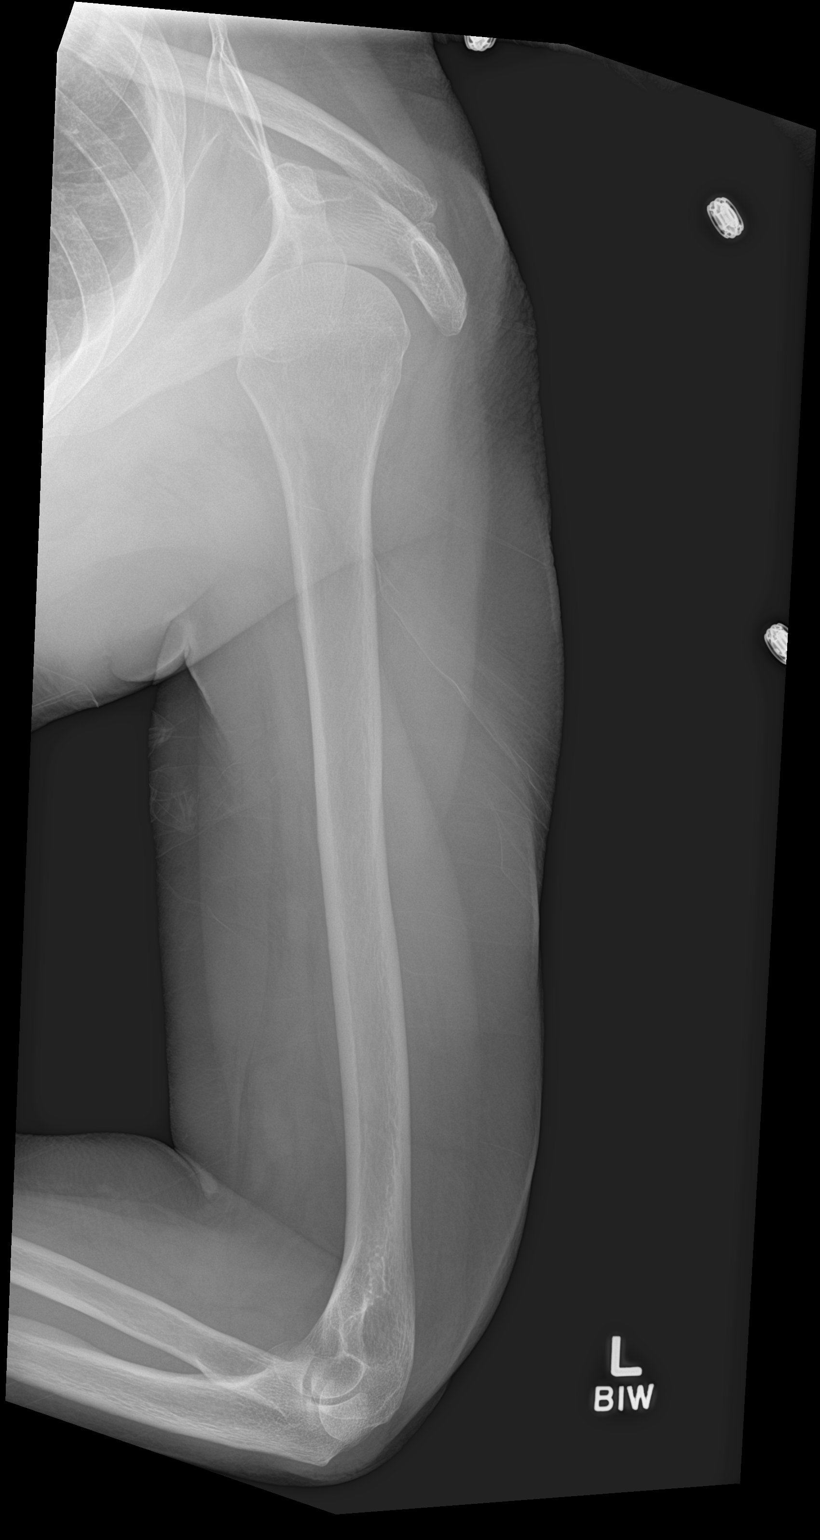

[2 of 2 positions shown; findings below may reference images not displayed]

FINDINGS: Frontal and lateral views were obtained. No fracture or dislocation.
Joint spaces appear intact. No erosive change.
IMPRESSION: No fracture or dislocation.  No appreciable arthropathy.

## 2016-11-12 IMAGING — CT CT CERVICAL SPINE W/O CM
2 of 7 series · 3 of 33 positions shown, 4 images · non-contrast
Comparison: Cervical spine CT September 07, 2007; head CT December 18, 2013;
brain MRI December 18, 2013

CLINICAL DATA: Patient fell. Patient receiving anticoagulant
medication

EXAM:
CT HEAD WITHOUT CONTRAST
CT CERVICAL SPINE WITHOUT CONTRAST
TECHNIQUE: Multidetector CT imaging of the head and cervical spine was
performed following the standard protocol without intravenous
contrast. Multiplanar CT image reconstructions of the cervical spine
were also generated.

[Series 306: cor · coronal · 0.24mm/px · 1 of 40 slices shown]
[im 20/40  bone]
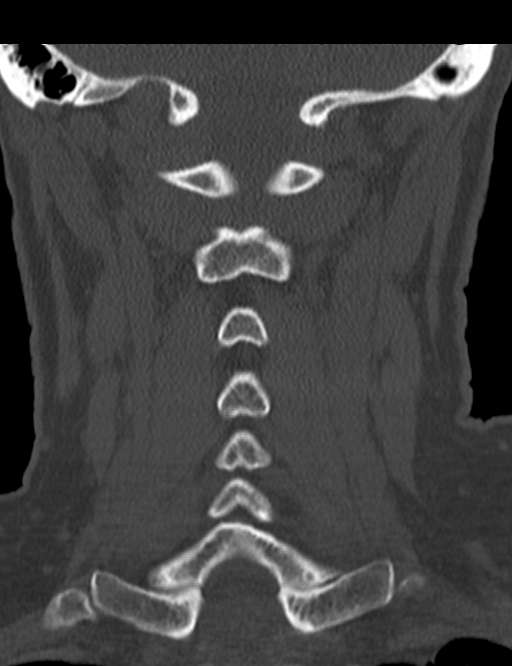

[Series 307: orthog · axial · 0.24mm/px · z∈[+90,+132]mm · 2 of 63 slices shown, 3 images]
[im 21/63  soft-tissue]
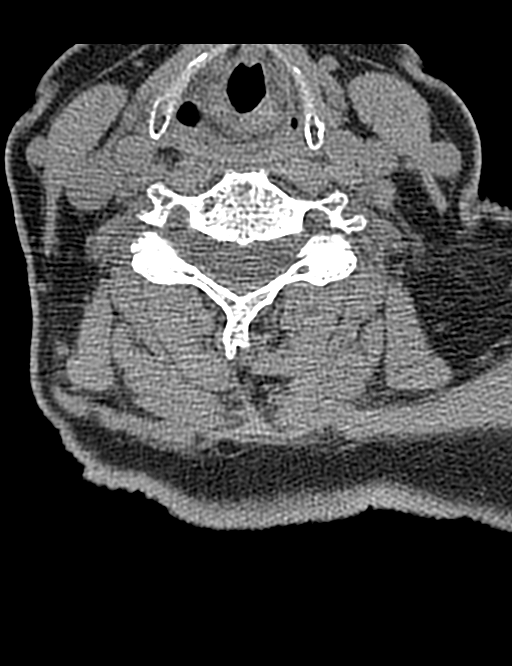
[im 21/63  bone]
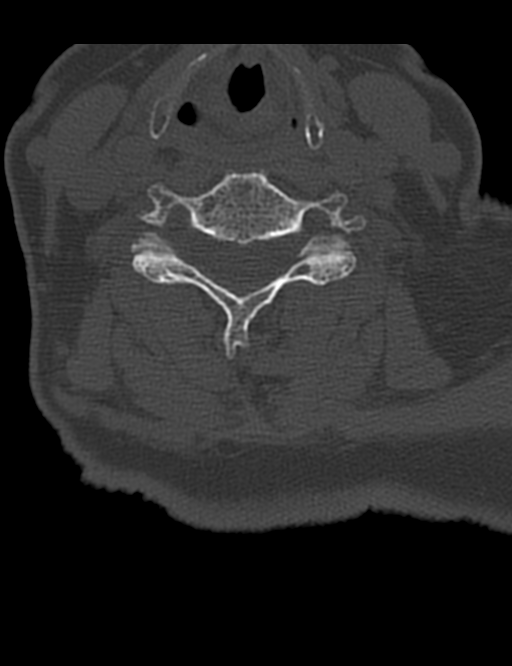
[im 42/63  bone]
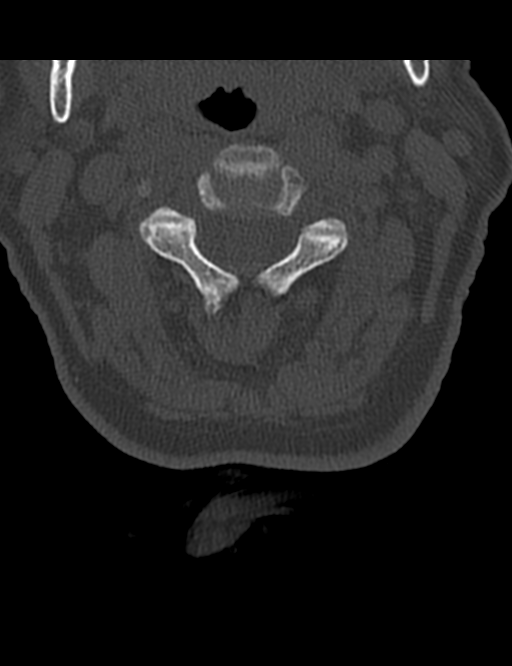

[3 of 33 positions shown; findings below may reference images not displayed]

FINDINGS: CT HEAD FINDINGS

The ventricles are normal in size and configuration. There is no
mass, hemorrhage, extra-axial fluid collection, or midline shift.
Gray-white compartments appear normal. No acute infarct apparent.
The bony calvarium appears intact. The mastoid air cells are clear.

CT CERVICAL SPINE FINDINGS

There is no fracture or spondylolisthesis. Prevertebral soft tissues
and predental space regions are normal. There is disc space
narrowing at C4-5, C5-6, and C6-7. There is facet hypertrophy at
several levels bilaterally. There is exit foraminal narrowing on the
left at C5-6 and C6-7. There is central disc bulging C5-6 and C6-7.
There is bullous disease in the lung apices.
IMPRESSION: CT head: No intracranial mass or hemorrhage. Gray-white compartments
are normal. No extra-axial fluid collection.

CT cervical spine: Areas of osteoarthritic change. No fracture or
spondylolisthesis.

## 2016-11-12 IMAGING — DX DG ELBOW COMPLETE 3+V*L*
4 series · 4 of 4 positions shown · non-contrast
Comparison: None.

CLINICAL DATA: 58-year-old female who fell at the skating ring.
Pain. Initial encounter.

EXAM:
LEFT ELBOW - COMPLETE 3+ VIEW

[elbow ap]
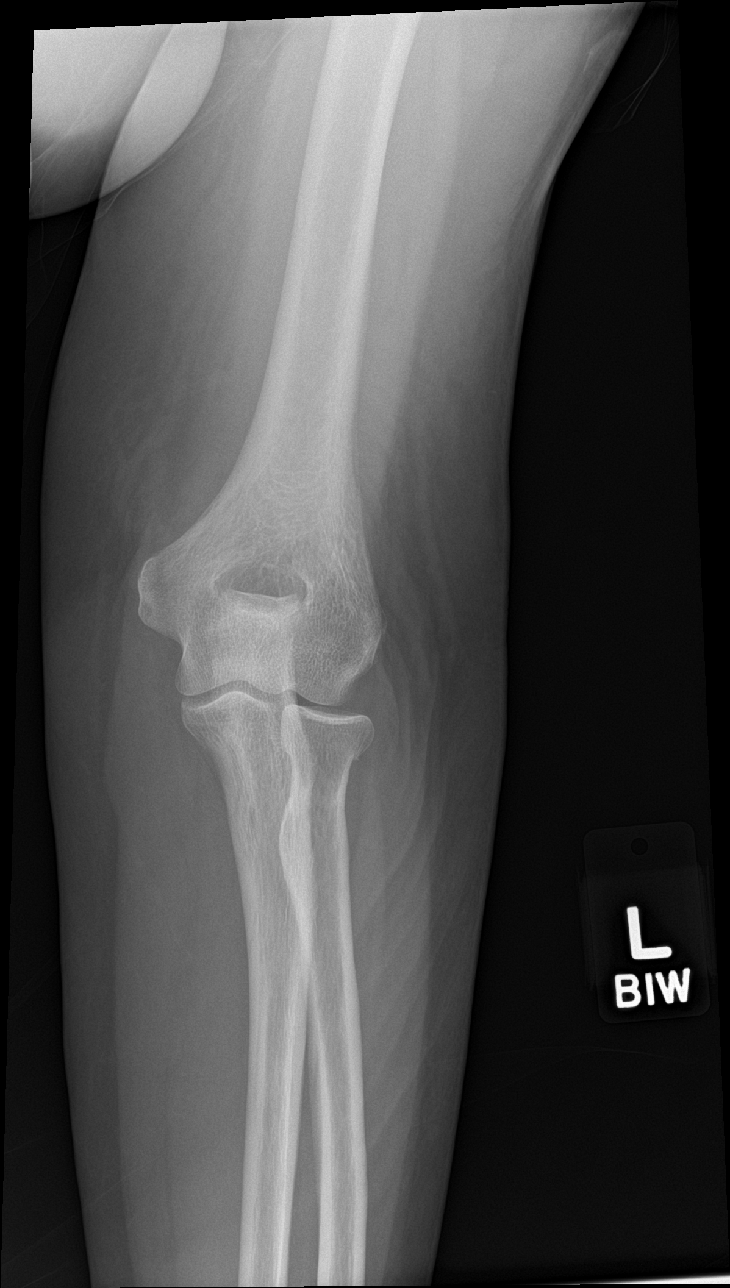

[elbow obl (1 of 2)]
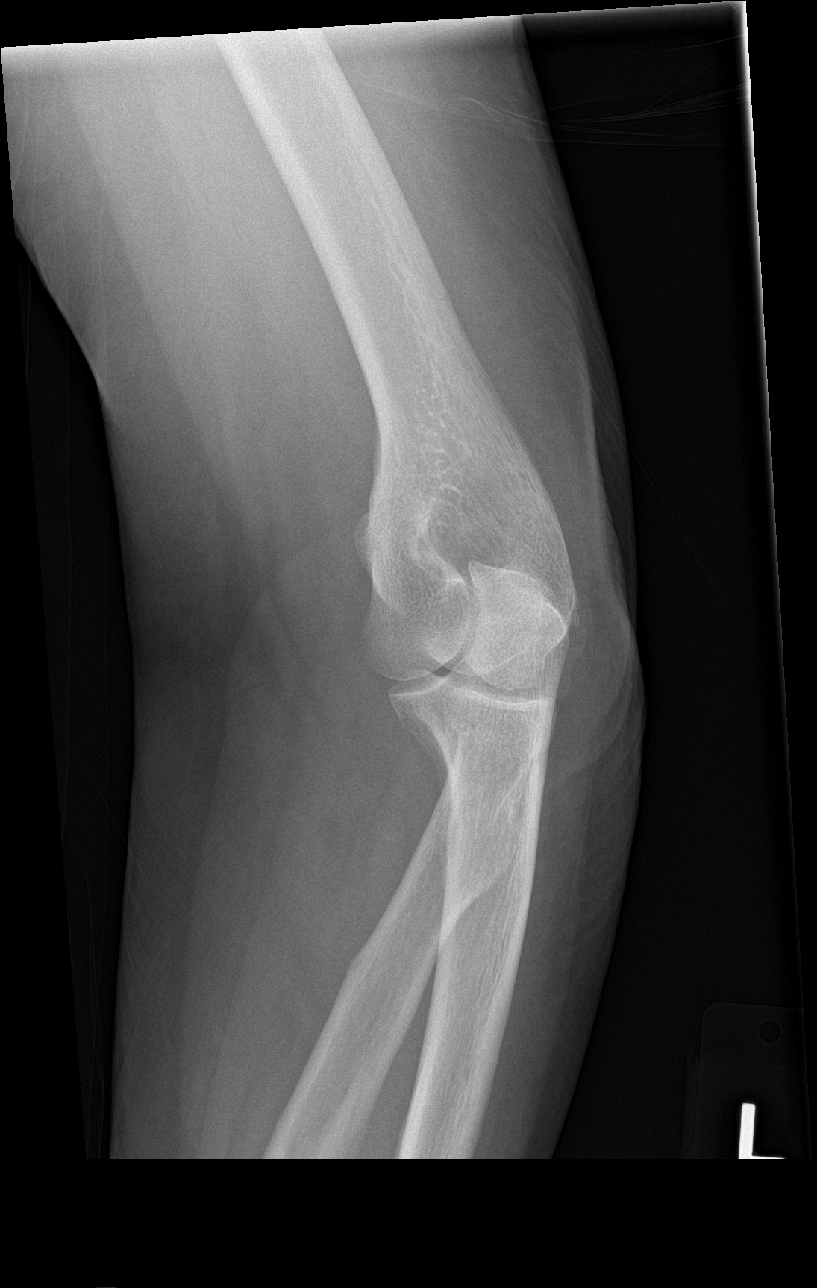

[elbow obl (2 of 2)]
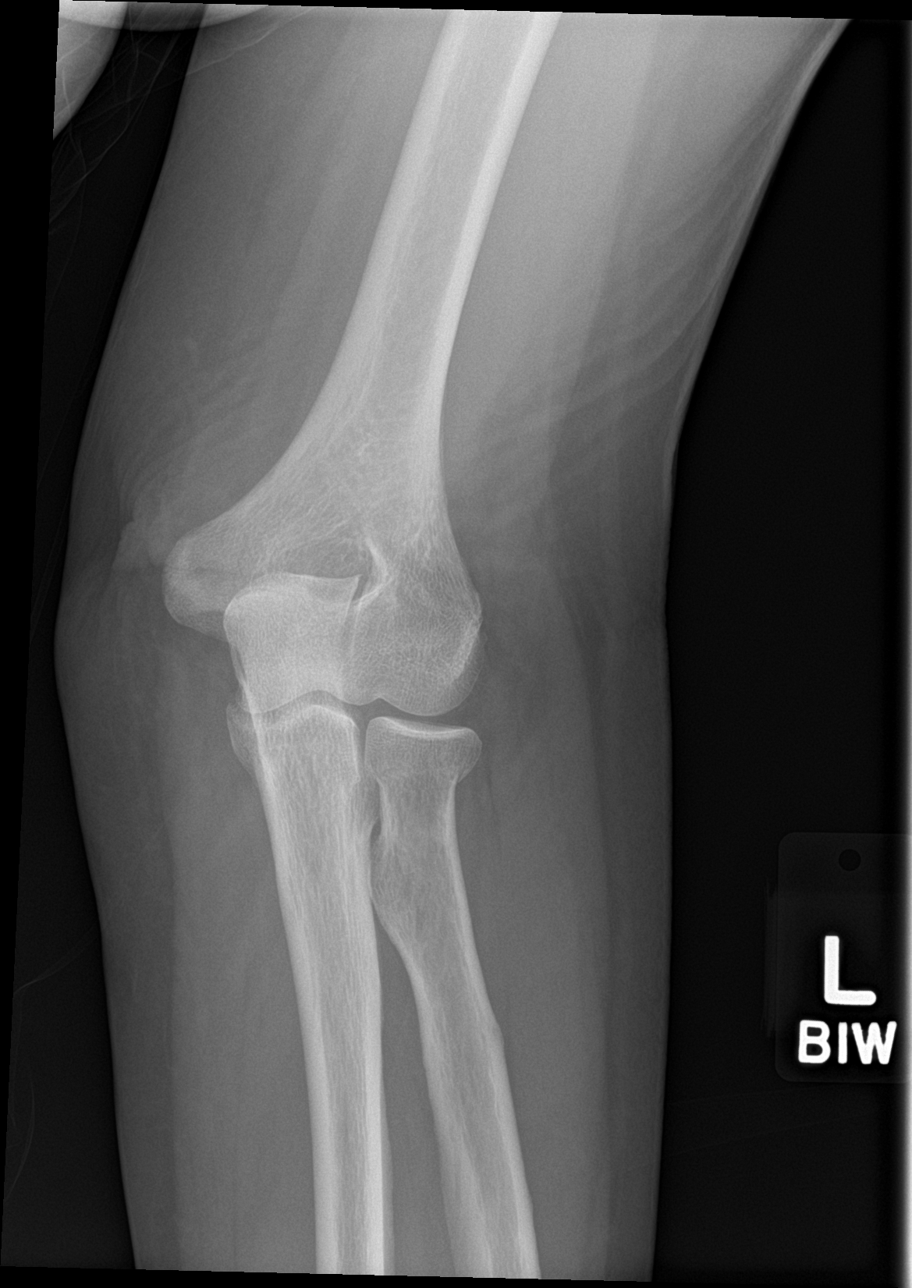

[elbow lat]
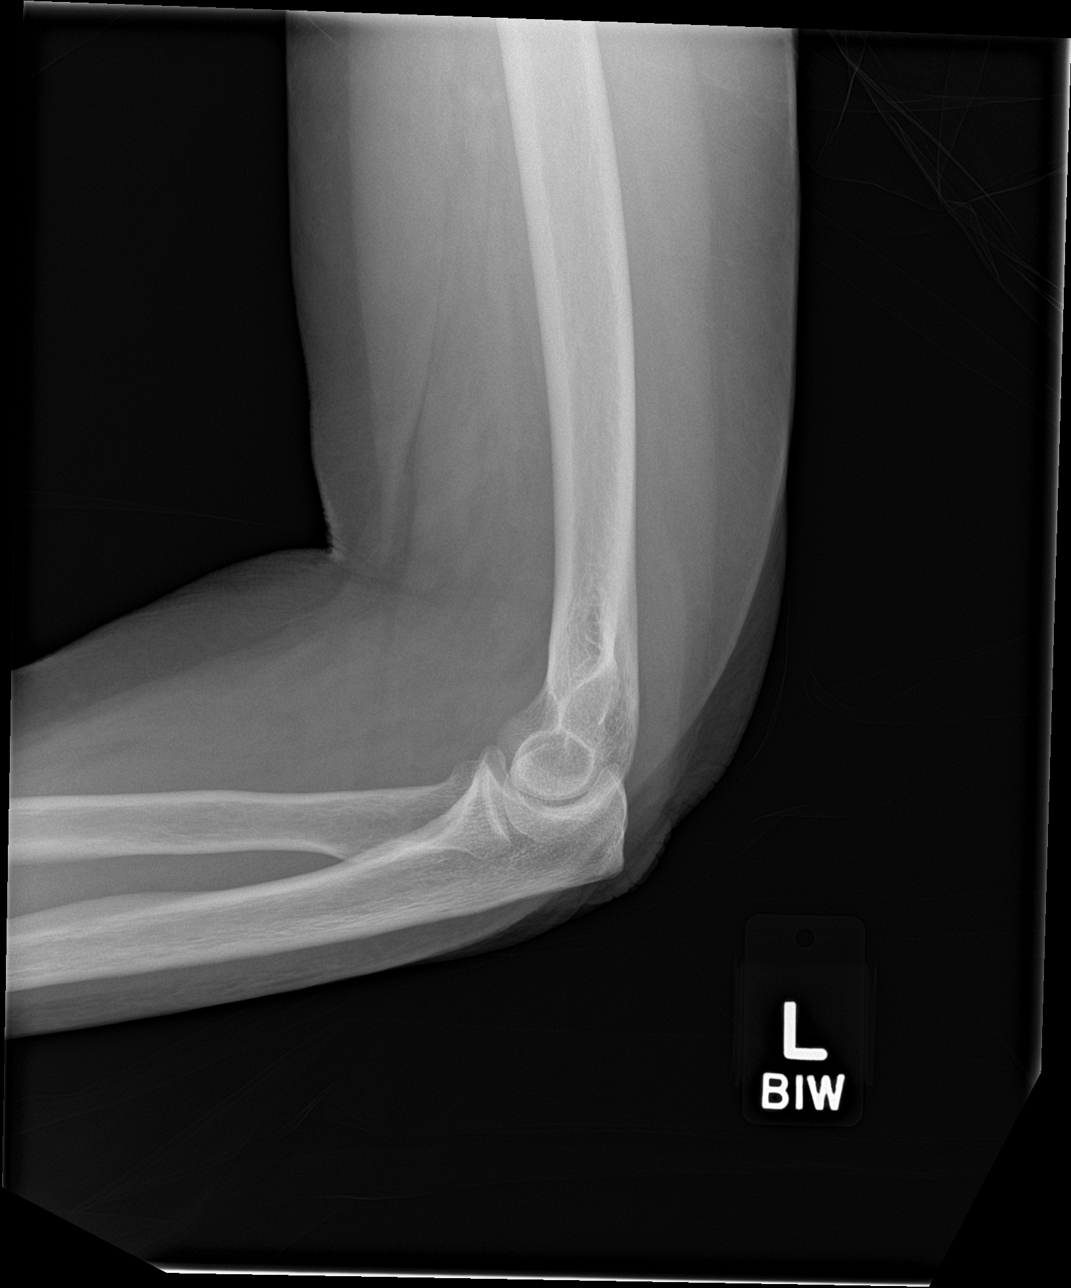

[4 of 4 positions shown; findings below may reference images not displayed]

FINDINGS: Probable small joint effusion. However, there is a nondisplaced
radial head fracture (arrow). Twin spaces and alignment preserved.
No other acute fracture identified.
IMPRESSION: Nondisplaced radial head fracture.

## 2016-11-12 IMAGING — DX DG SACRUM/COCCYX 2+V
3 series · 3 of 3 positions shown · non-contrast
Comparison: CT Abdomen and Pelvis 08/10/2013.

CLINICAL DATA: 58-year-old female fell [place] skating r[place]. Pain.
Initial encounter.

EXAM:
SACRUM AND COCCYX - 2+ VIEW

[sacrum ap]
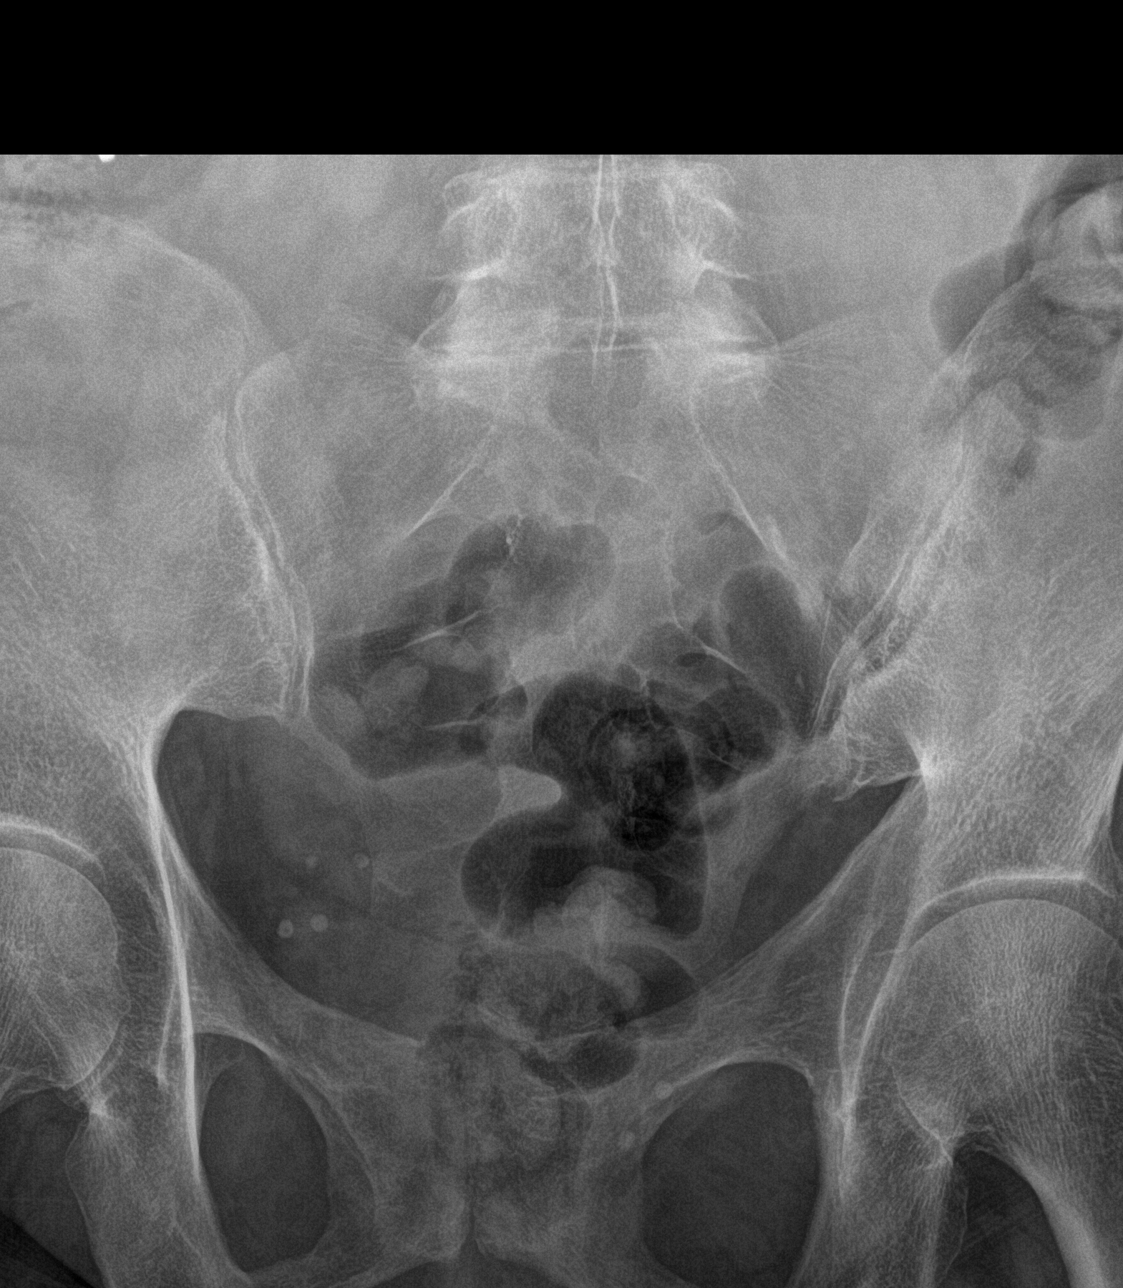

[coccyx ap]
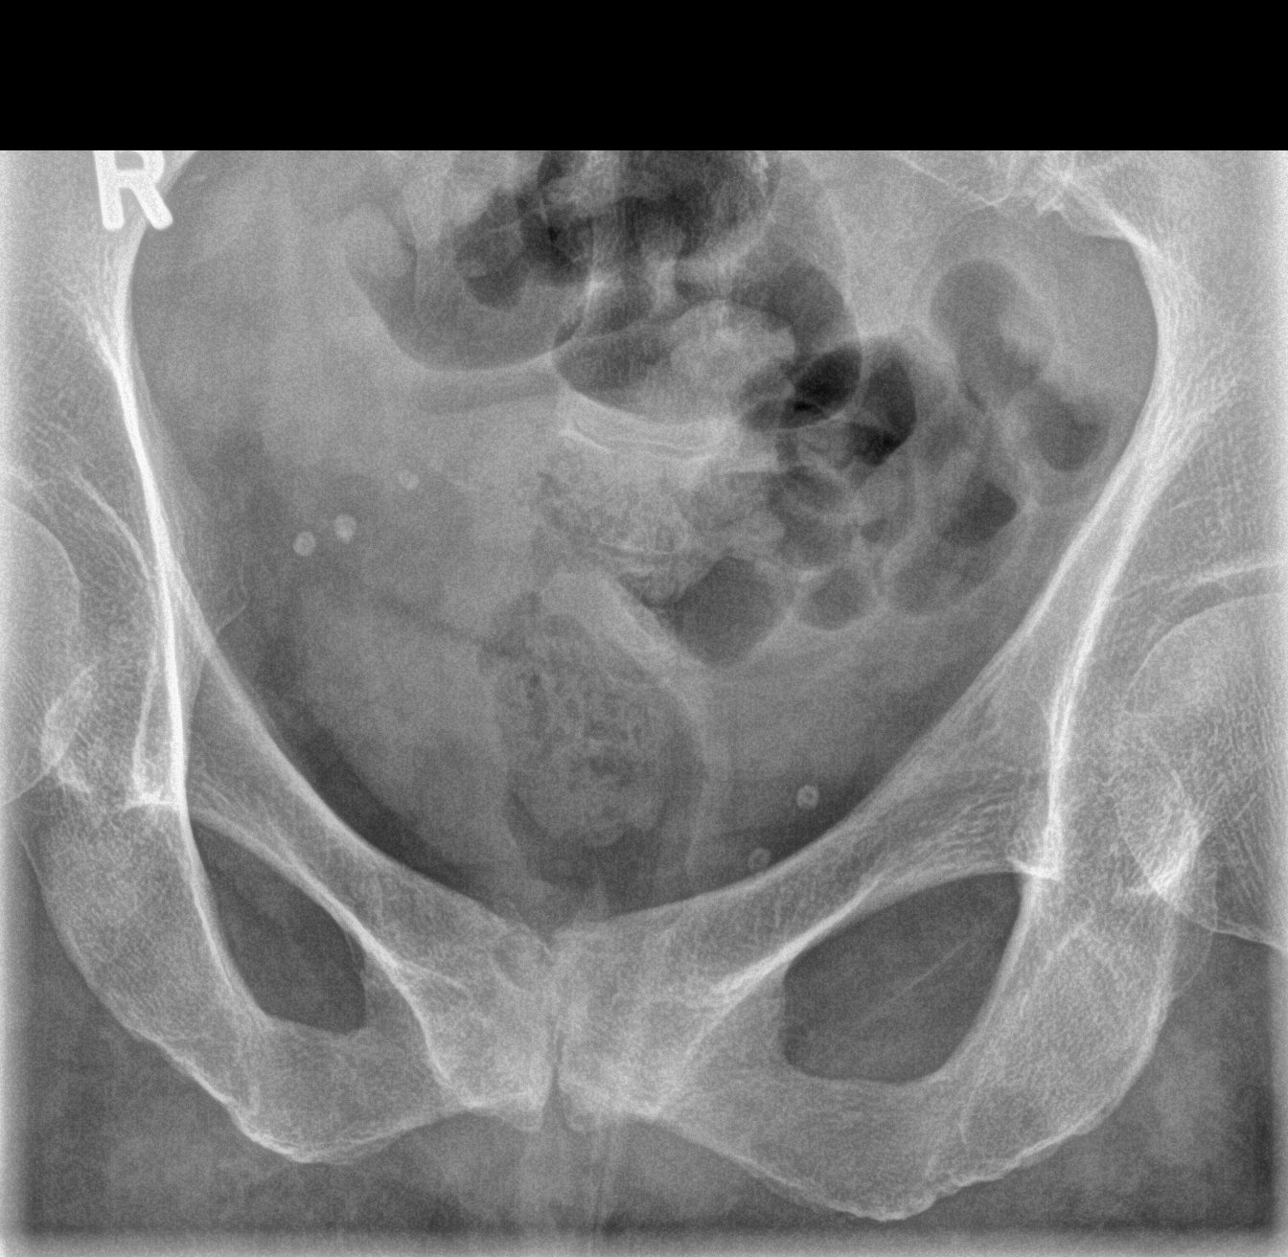

[sacrum lat]
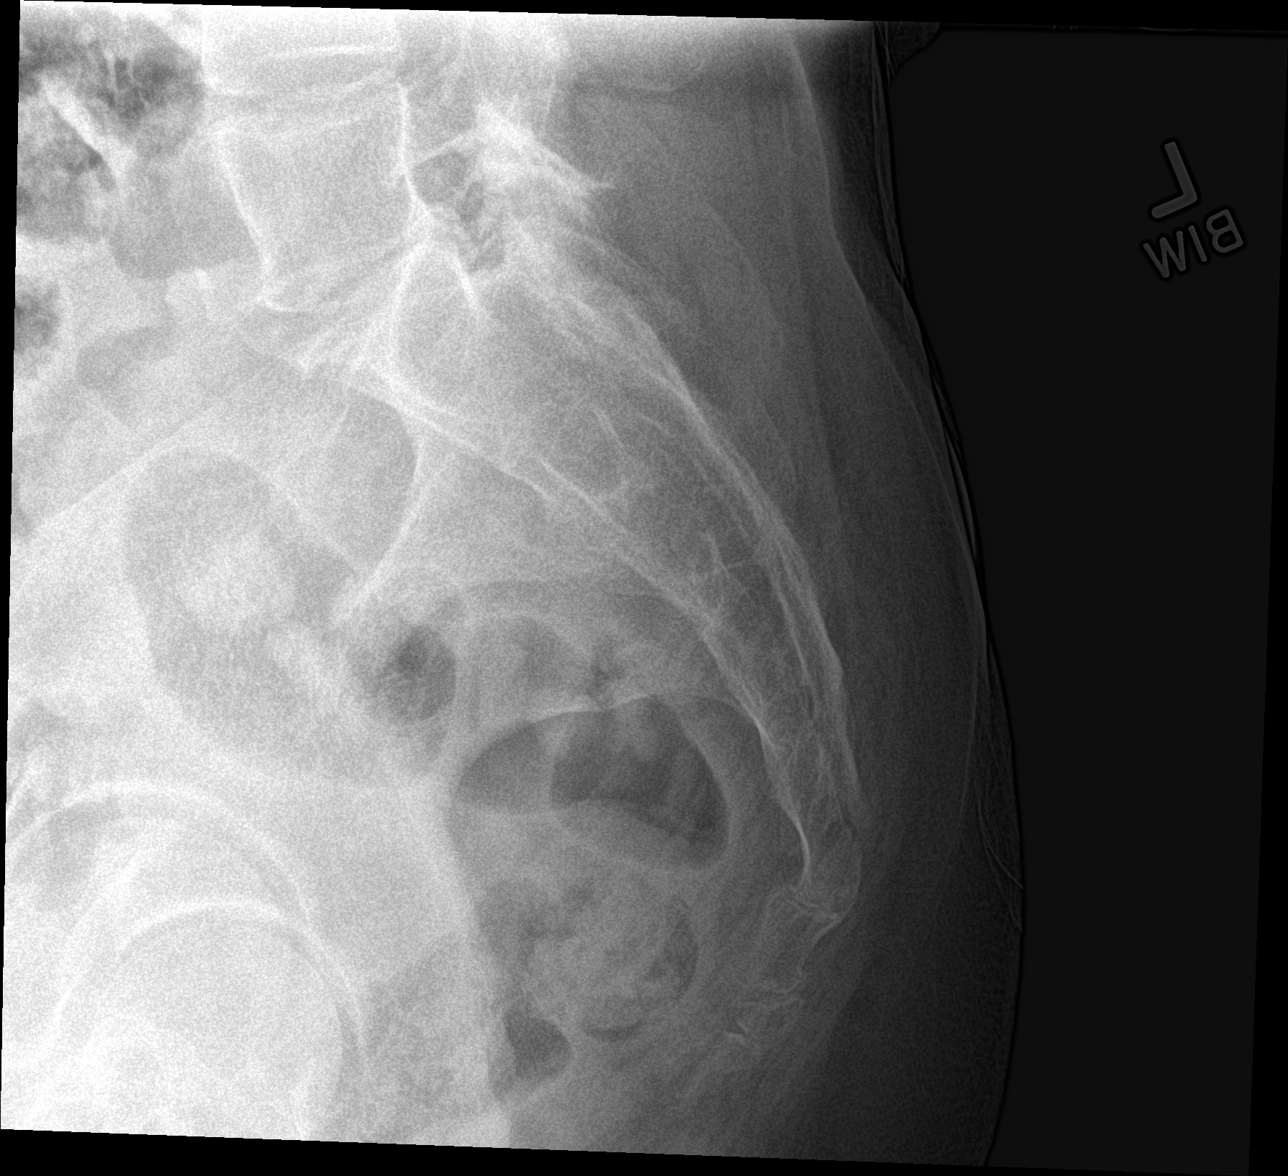

[3 of 3 positions shown; findings below may reference images not displayed]

FINDINGS: Bone mineralization is within normal limits. On the lateral view the
sacrum and coccygeal segments appear stable. Chronic lumbosacral
junction degenerative changes. Sacral ala and SI joints appear
intact. Visible pelvis intact..
IMPRESSION: No acute fracture or dislocation identified about the sacrum or
coccyx.

## 2016-11-12 IMAGING — DX DG LUMBAR SPINE COMPLETE 4+V
5 series · 5 of 5 positions shown · non-contrast
Comparison: None.

CLINICAL DATA: [place]. Low back pain. Initial
encounter.

EXAM:
LUMBAR SPINE - COMPLETE 4+ VIEW

[l-spine ap]
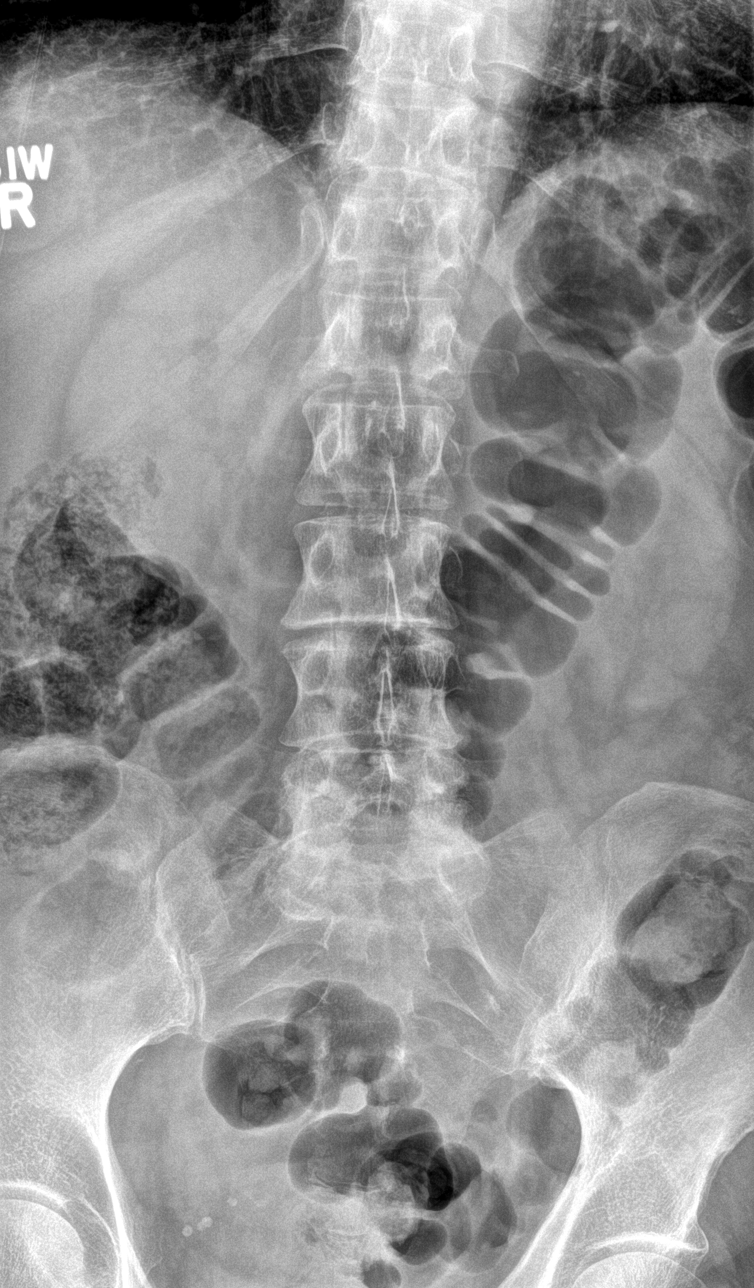

[l-spine obl (1 of 2)]
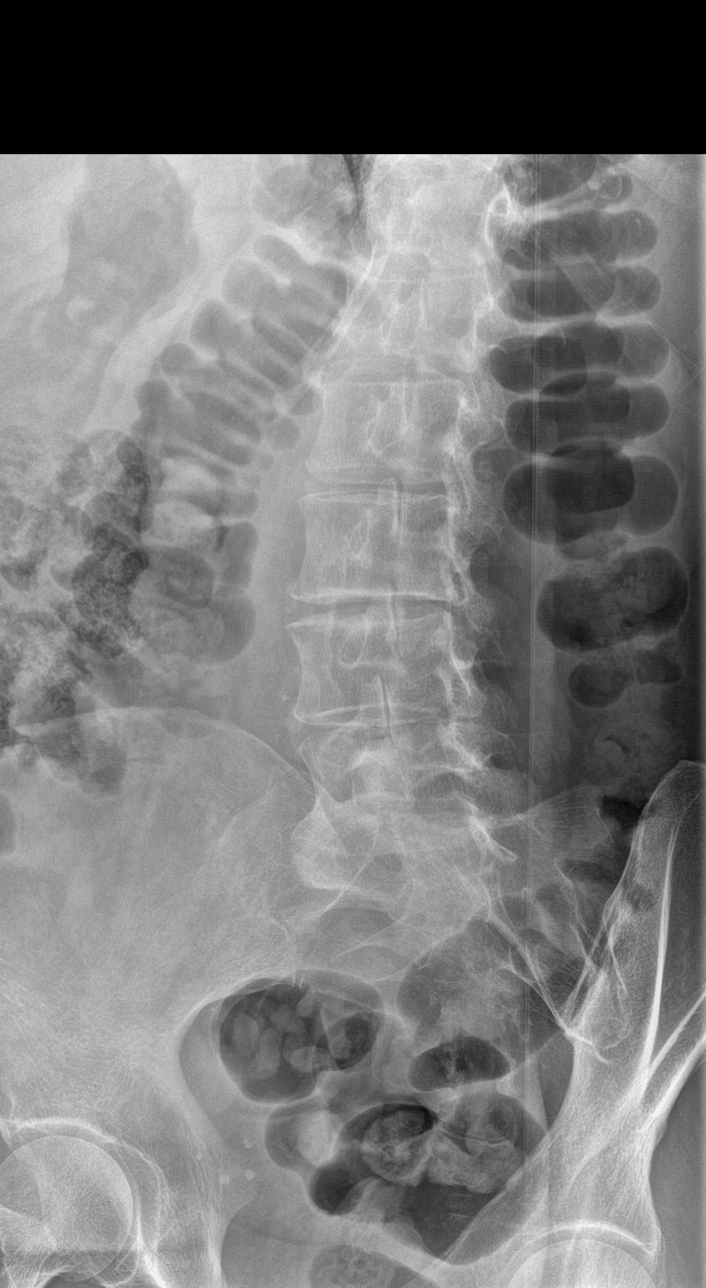

[l-spine obl (2 of 2)]
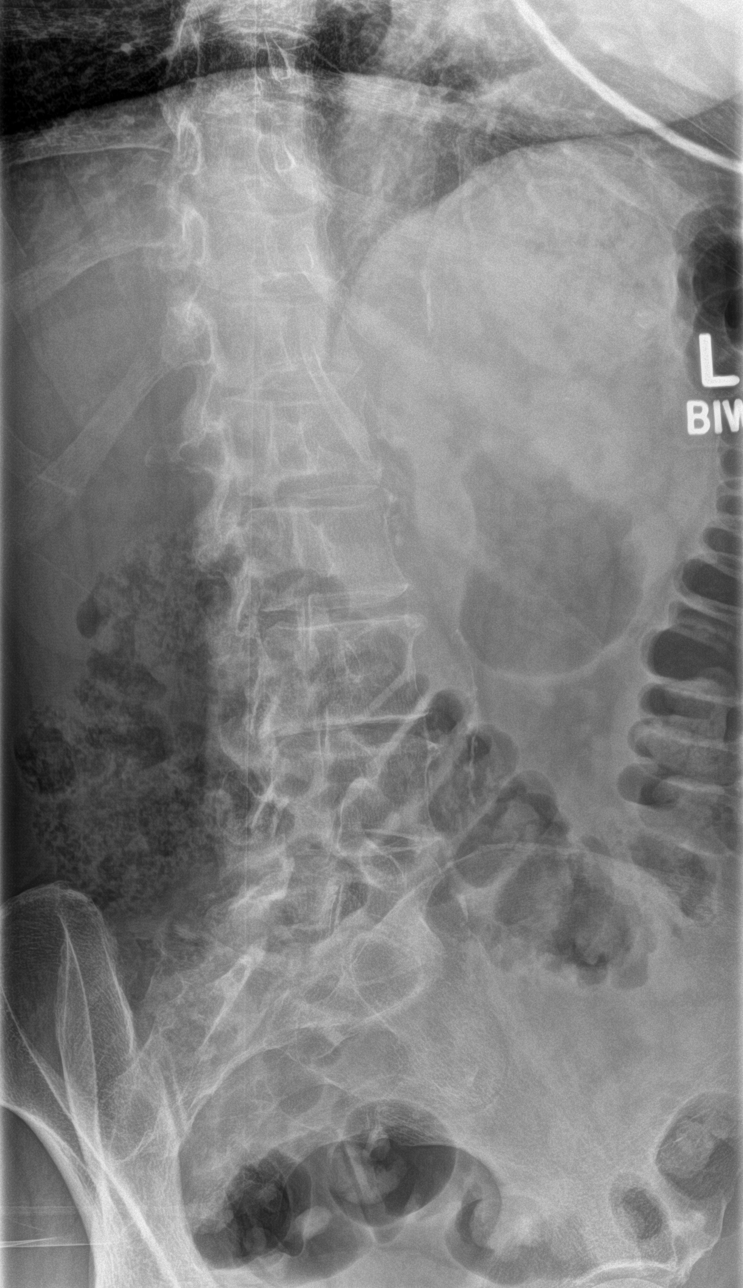

[l-spine lat]
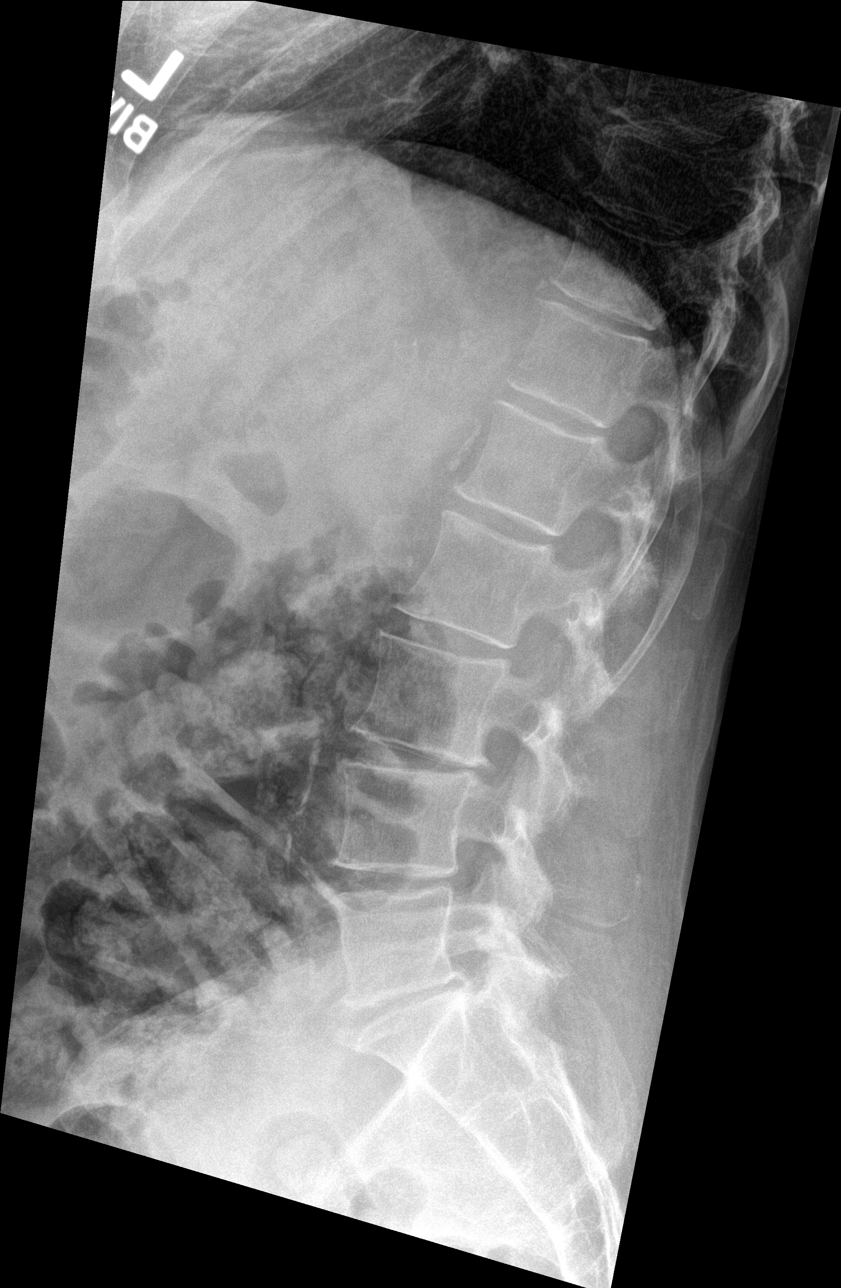

[l-spine spot]
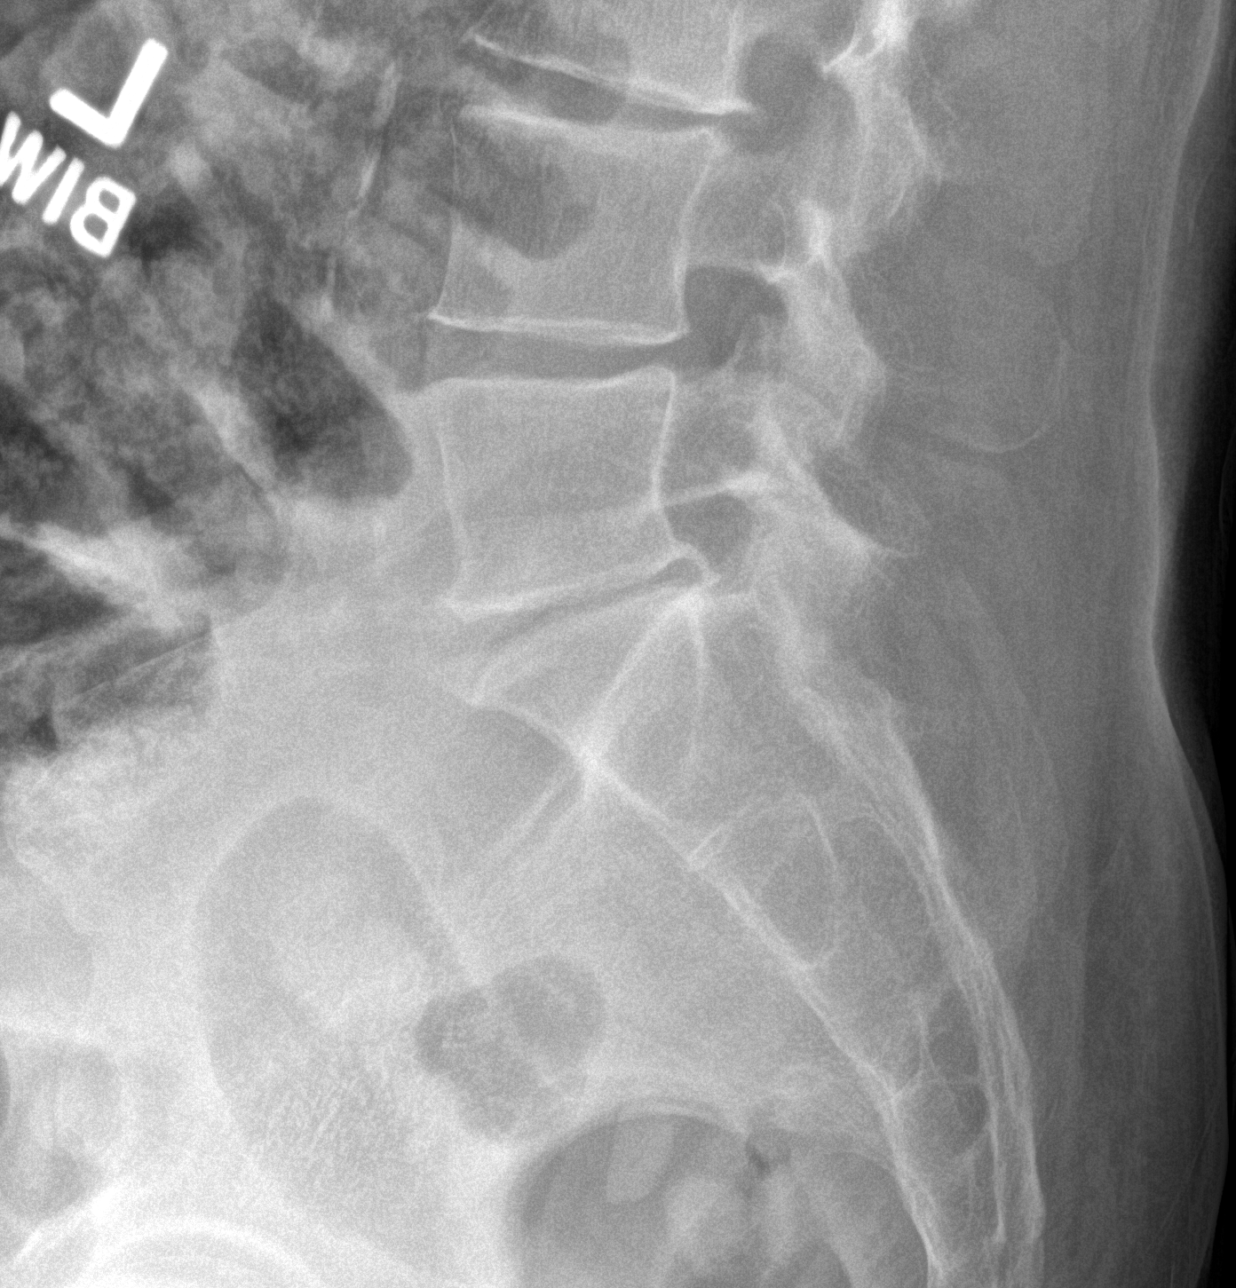

[5 of 5 positions shown; findings below may reference images not displayed]

FINDINGS: There is no evidence of lumbar spine fracture.  Alignment is normal.

Mild degenerative disc disease is seen at levels of L3-4, L4-5, and
L5-S1. Right-sided facet DJD also seen at L5-S1.
IMPRESSION: No acute findings.

Degenerative spondylosis, as described above.

## 2016-11-14 ENCOUNTER — Telehealth: Payer: Self-pay | Admitting: Physician Assistant

## 2016-11-14 NOTE — Telephone Encounter (Signed)
The pt was advised to take protonix 40 mg BID, she states she is having problems swallowing and wants to discuss.  She already has an appt with Anderson Malta on 6/18 and will keep that for the dysphagia  Notes recorded by Levin Erp, PA on 10/29/2016 at 12:45 PM EDT UGI shows gastritis possible with some reflux and is otherwise negative. Please let patient know. Please make sure she is on BID PPI at 40mg . Thanks-JLL

## 2016-11-15 DIAGNOSIS — Z4802 Encounter for removal of sutures: Secondary | ICD-10-CM | POA: Diagnosis not present

## 2016-11-18 ENCOUNTER — Other Ambulatory Visit (INDEPENDENT_AMBULATORY_CARE_PROVIDER_SITE_OTHER): Payer: Medicare Other

## 2016-11-18 ENCOUNTER — Telehealth: Payer: Self-pay | Admitting: Cardiology

## 2016-11-18 ENCOUNTER — Encounter: Payer: Self-pay | Admitting: Physician Assistant

## 2016-11-18 ENCOUNTER — Ambulatory Visit (INDEPENDENT_AMBULATORY_CARE_PROVIDER_SITE_OTHER): Payer: Medicare Other | Admitting: Physician Assistant

## 2016-11-18 VITALS — BP 110/64 | HR 74 | Ht 65.0 in | Wt 116.0 lb

## 2016-11-18 DIAGNOSIS — Z1211 Encounter for screening for malignant neoplasm of colon: Secondary | ICD-10-CM

## 2016-11-18 DIAGNOSIS — Z86010 Personal history of colonic polyps: Secondary | ICD-10-CM

## 2016-11-18 DIAGNOSIS — K2970 Gastritis, unspecified, without bleeding: Secondary | ICD-10-CM | POA: Diagnosis not present

## 2016-11-18 DIAGNOSIS — K219 Gastro-esophageal reflux disease without esophagitis: Secondary | ICD-10-CM | POA: Diagnosis not present

## 2016-11-18 DIAGNOSIS — I208 Other forms of angina pectoris: Secondary | ICD-10-CM

## 2016-11-18 DIAGNOSIS — K2990 Gastroduodenitis, unspecified, without bleeding: Secondary | ICD-10-CM

## 2016-11-18 LAB — COMPREHENSIVE METABOLIC PANEL WITH GFR
ALT: 7 U/L (ref 0–35)
AST: 17 U/L (ref 0–37)
Albumin: 4.2 g/dL (ref 3.5–5.2)
Alkaline Phosphatase: 62 U/L (ref 39–117)
BUN: 13 mg/dL (ref 6–23)
CO2: 31 meq/L (ref 19–32)
Calcium: 9.8 mg/dL (ref 8.4–10.5)
Chloride: 107 meq/L (ref 96–112)
Creatinine, Ser: 0.64 mg/dL (ref 0.40–1.20)
GFR: 99.72 mL/min
Glucose, Bld: 170 mg/dL — ABNORMAL HIGH (ref 70–99)
Potassium: 4.2 meq/L (ref 3.5–5.1)
Sodium: 141 meq/L (ref 135–145)
Total Bilirubin: 0.4 mg/dL (ref 0.2–1.2)
Total Protein: 6.3 g/dL (ref 6.0–8.3)

## 2016-11-18 LAB — CBC WITH DIFFERENTIAL/PLATELET
Basophils Absolute: 0 K/uL (ref 0.0–0.1)
Basophils Relative: 0.3 % (ref 0.0–3.0)
Eosinophils Absolute: 0 K/uL (ref 0.0–0.7)
Eosinophils Relative: 0.8 % (ref 0.0–5.0)
HCT: 40.3 % (ref 36.0–46.0)
Hemoglobin: 13.8 g/dL (ref 12.0–15.0)
Lymphocytes Relative: 10 % — ABNORMAL LOW (ref 12.0–46.0)
Lymphs Abs: 0.6 K/uL — ABNORMAL LOW (ref 0.7–4.0)
MCHC: 34.3 g/dL (ref 30.0–36.0)
MCV: 93.6 fl (ref 78.0–100.0)
Monocytes Absolute: 0.4 K/uL (ref 0.1–1.0)
Monocytes Relative: 6.1 % (ref 3.0–12.0)
Neutro Abs: 5.3 K/uL (ref 1.4–7.7)
Neutrophils Relative %: 82.8 % — ABNORMAL HIGH (ref 43.0–77.0)
Platelets: 157 K/uL (ref 150.0–400.0)
RBC: 4.31 Mil/uL (ref 3.87–5.11)
RDW: 13.8 % (ref 11.5–15.5)
WBC: 6.4 K/uL (ref 4.0–10.5)

## 2016-11-18 LAB — TSH: TSH: 1.41 u[IU]/mL (ref 0.35–4.50)

## 2016-11-18 NOTE — Telephone Encounter (Signed)
New message    Pt is calling about her heart rate. She is asking for a call back. She said it was 54 this morning and she felt like the was going to pass out. She said her hr is 85 now. She does not check her bp when she doesn't feel good. She gets her pulse from her watch. She has an appt tomorrow with Dr. [name].

## 2016-11-18 NOTE — Patient Instructions (Signed)
Please go to the basement level to have your labs drawn.  Decrease Pantoprazole Sodium 40 mg to one tablet daily.   Get appointment with Cardiology.

## 2016-11-18 NOTE — Progress Notes (Signed)
Subjective:    Patient ID: Michele [name], female    DOB: 1955/10/22, 61 y.o.   MRN: [id number]  HPI Michele [name] is a 61 year old white female, known to Dr. Ardis Hughs who was last seen in the office about 3 weeks ago by Michele Newer PA-C for complaints of abdominal pain. Her Protonix was increased to twice a day dosing and she was scheduled for upper GI and small bowel follow-through which was negative except for finding of probable gastritis. Michele [name] Patient has multiple medical issues including coronary artery disease status post previous stents, she is on Plavix and aspirin. Also with COPD, previous MI, history of limited stage small cell lung cancer presenting with right mediastinal lymphadenopathy diagnosed in September 2017. She is followed by Dr. Earlie Server and completed chemotherapy and radiation in December 2017. Previous EGD in May 2017 with dilation of benign esophageal stricture and colonoscopy at that same setting with 3, 4 mm polyps all removed which were sessile serrated adenomas. She was seen in the ER on 10/14/2016 with complaints of abdominal pain and had CT of the abdomen and pelvis done which showed no focal liver abnormality, spleen top normal in size stable from prior CT, no enlarged abdominal or pelvic lymph nodes, and no evidence of metastatic disease below the diaphragm.  Patient comes in today stating that her abdominal pain is somewhat improved but she's been having some muscle aches and wonders if this may be due to the high dose of Protonix. She also has multiple other non-GI complaints. She says she's been having palpitations frequently, and spells of feeling dizzy and unbalanced She is also generally weaker and says she is continuing to lose weight. Her weight is down about 3 pounds here since her last office visit. She also is complaining of headaches over the past 4 weeks. Been located at the top of her head describes them as pressure-like and present all day long. She is clearly  worried about spread of her cancer and asks if we found any evidence of this on the recent past. She is also complaining of coughing and production of thick sputum which she has had for some time. She voices being unhappy with her cardiologist and says she would like to switch to Dr. Caryl Comes. She also appears frustrated with oncology and says they haven't answered her calls. She has CT of the chest scheduled for next week, and then subsequent visit with Dr. Earlie Server.  Review of Systems Pertinent positive and negative review of systems were noted in the above HPI section.  All other review of systems was otherwise negative.  Outpatient Encounter Prescriptions as of 11/18/2016  Medication Sig  . Alum & Mag Hydroxide-Simeth (GI COCKTAIL) SUSP suspension Take 5-10 ml q for to 6 hours as needed for abdominal pain  . Calcium Carbonate-Vitamin D (CALCIUM 600+D PO) Take 1 tablet by mouth daily.   . carvedilol (COREG) 6.25 MG tablet Take 1 tablet (6.25 mg total) by mouth 2 (two) times daily with a meal.  . clonazePAM (KLONOPIN) 2 MG tablet Take 2 mg by mouth 3 (three) times daily as needed for anxiety.   . clopidogrel (PLAVIX) 75 MG tablet TAKE 1 TABLET(75 MG) BY MOUTH DAILY  . Coenzyme Q10 (COQ10) 100 MG CAPS Take 100 mg by mouth daily. Reported on 10/16/2015  . dicyclomine (BENTYL) 20 MG tablet Take 1 tablet (20 mg total) by mouth 2 (two) times daily.  . fluticasone (FLONASE) 50 MCG/ACT nasal spray Place 2 sprays into both nostrils every  morning.  . isosorbide mononitrate (IMDUR) 30 MG 24 hr tablet TAKE 1 TABLET(30 MG) BY MOUTH AT BEDTIME  . magic mouthwash SOLN Take 5 mLs by mouth 4 (four) times daily.  [name] neomycin-polymyxin b-dexamethasone (MAXITROL) 3.5-10000-0.1 OINT Place 1 application into both eyes at bedtime.   . nitroGLYCERIN (NITROSTAT) 0.4 MG SL tablet DISSOLVE 1 TABLET UNDER THE TONGUE EVERY 5 MINUTES AS NEEDED FOR CHEST PAIN  . pantoprazole (PROTONIX) 40 MG tablet Take 1 tablet (40 mg total) by  mouth 2 (two) times daily before a meal.  . Polyethyl Glycol-Propyl Glycol (SYSTANE OP) Place 2 drops into both eyes 4 (four) times daily.  . polyethylene glycol (MIRALAX / GLYCOLAX) packet Take 17 g by mouth daily.  . pravastatin (PRAVACHOL) 40 MG tablet TAKE 1 TABLET(40 MG) BY MOUTH EVERY EVENING  . sennosides-docusate sodium (SENOKOT-S) 8.6-50 MG tablet Take 1 tablet by mouth 2 (two) times daily as needed for constipation.   . traMADol (ULTRAM) 50 MG tablet Take 1 tablet (50 mg total) by mouth every 6 (six) hours as needed.  [name] VASCEPA 1 g CAPS TAKE 1 CAPSULES BY MOUTH TWICE DAILY (Patient taking differently: TAKE 1 CAPSULES BY MOUTH DAILY)  . VENTOLIN HFA 108 (90 Base) MCG/ACT inhaler Inhale 2 puffs into the lungs every 4 (four) hours as needed for wheezing or shortness of breath.    Facility-Administered Encounter Medications as of 11/18/2016  Medication  . HYDROcodone-acetaminophen (NORCO/VICODIN) 5-325 MG per tablet 1 tablet   Allergies  Allergen Reactions  . Ciprofloxacin Other (See Comments)    hypoglycemia  . Aspirin Other (See Comments)     GI upset (takes low dose aspirin at night) - Stomach ache - No stomach bleed Pt can take enteric coated  . Crestor [Rosuvastatin] Other (See Comments)    Myalgias   . Ibuprofen Other (See Comments)    GI upset  . Wellbutrin [Bupropion] Palpitations  . Lipitor [Atorvastatin] Other (See Comments)    Myalgias   . Sulfonamide Derivatives Itching and Rash   Patient Active Problem List   Diagnosis Date Noted  . PMB (postmenopausal bleeding) 06/17/2016  . Superficial venous thrombosis of right upper extremity 05/22/2016  . Extravasation injury 04/18/2016  . Constipation 03/29/2016  . Cancer associated pain 03/29/2016  . Dehydration 03/29/2016  . Encounter for antineoplastic chemotherapy 03/12/2016  . 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 Small cell carcinoma of right lung ([place]) 02/26/2016  . Stable angina  ([place]) 02/25/2015  . Stenosis of coronary stent 02/25/2015  . Abdominal pain in female patient 11/24/2014  . Chronic fatigue and malaise 04/10/2014  . Heart palpitations 11/28/2013  . Essential hypertension 05/30/2013  . Coronary artery spasm, hx of 04/30/2013  . Acrocyanosis ([place]) 01/01/2013  . Tobacco abuse, ongoing   . Edema of upper extremity 11/30/2012  . Lipoma of lower extremity 10/23/2012  . CAD -S/P MI-PCI AVG 5/13 then staged DES to RCA  11/06/11. ISR- PCI 11/19/12, cath 02/18/13- no ISR, + spasm, Myoview low risk Nov 2014 10/10/2011    Class: Diagnosis of  . History ST elevation myocardial infarction (STEMI) of inferolateral wall 10/02/2011  . Osteopenia 07/30/2011  . Leg pain, anterior, left 08/24/2010  . Palpitations 11/28/2009  . HERPES SIMPLEX INFECTION 06/28/2009  . VITAMIN D DEFICIENCY 11/28/2008  . INSOMNIA 10/01/2007  . HYPERGLYCEMIA 08/30/2007  . Dyslipidemia, goal LDL below 70 08/10/2007  . COPD (chronic obstructive pulmonary disease) ([place]) 12/23/2006  . Anxiety state 06/11/2006    Class: Diagnosis  of  . Depression 06/11/2006  . RESTLESS LEG SYNDROME 06/11/2006  . GERD 06/11/2006  . KNEE PAIN, CHRONIC 06/11/2006  . SPONDYLOSIS, CERVICAL, WITH RADICULOPATHY 06/11/2006  . LOW BACK PAIN 06/11/2006  . SEIZURE DISORDER 06/11/2006    Class: Diagnosis of   Social History   Social History  . Marital status: Significant Other    Spouse name: N/A  . Number of children: 5  . Years of education: N/A   Occupational History  . Disabled  Disabled   Social History Main Topics  . Smoking status: Former Smoker    Packs/day: 1.50    Years: 40.00    Types: Cigarettes    Quit date: 02/02/2016  . Smokeless tobacco: Never Used     Comment: 04/15/12 "I quit once for 2 1/2 years; smoking cessation counselor already here to visit"; done to less than 1/2 ppd (03/02/2013) - "1 pack per week" - 05/24/13  . Alcohol use No  . Drug use: No  . Sexual activity: Not Currently     Birth control/ protection: Post-menopausal   Other Topics Concern  . Not on file   Social History Narrative   Divorced mother of 21 and a grandmother 52, great-grandmother of 1    On disability, previously worked as a Educational psychologist.  Quit smoking 06/2007 but restarted 1/11 -- smoking a pack a day.  -- now a pack lasts a week.   Does not drink alcohol.   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   0 Caffeine drinks daily     Ms. Lisa's family history includes Asthma in her mother; Cancer in her maternal grandmother; Colon polyps in her mother; Diabetes in her mother; Emphysema in her mother; Heart attack in her maternal grandfather; Heart disease in her father and mother; Hyperlipidemia in her mother; Hypertension in her mother; Kidney cancer in her brother; Stomach cancer in her brother and brother; Stroke in her mother; Stroke (age of onset: 14) in her brother.      Objective:    Vitals:   11/18/16 1119  BP: 110/64  Pulse: 74    Physical Exam  well-developed thin somewhat chronically ill-appearing white female in no acute distress, accompanied by her husband blood pressure 110/64 pulse 74, height 5 foot 5, weight 116, BMI of 19.3. HEENT; nontraumatic normocephalic EOMI PERRLA sclera anicteric, Cardiovascular;regular rate and rhythm with S1-S2, Pulmonary somewhat decreased breath sounds bilaterally, Abdomen ;soft, no focal tenderness no palpable mass or hepatosplenomegaly bowel sounds are present, Rectal ;exam not done, Extremities; no clubbing cyanosis or edema skin warm and dry, Neuropsych ;patient anxious but appropriate       Assessment & Plan:   #56 61 year old white female with history of limited stage small cell lung cancer diagnosed June 2017 status post chemotherapy and radiation. #2 recent epigastric pain improved with PPI. Upper GI consistent with gastritis, and recent CT of the abdomen and pelvis May 2018 with no evidence of metastatic disease  below the diaphragm. #3 new-onset headaches 1 month #3 COPD #4 weakness and continued weight loss #5 intermittent palpitations #6 dizziness #7 history of coronary artery disease status post previous stents, #8 chronic antiplatelet therapy on aspirin and Plavix #9 chronic kidney disease #10 history of sessile serrated adenomatous polyps-follow-up colonoscopy 2022  Plan; we'll decrease Protonix to 40 mg by mouth every morning Offered Carafate which she says she hasn't been able to tolerate in the past. Check baseline labs today. The majority of her complaints today  are non-GI. She is asked to address with Dr. Earlie Server when she sees him next week after repeat CT of the chest. Also asked her to make  follow-up cardiology appointment. She'll follow up with GI on an as-needed basis.  Shlomo Seres S Emeterio Balke PA-C 11/18/2016   Cc: [name], MD

## 2016-11-18 NOTE — Telephone Encounter (Signed)
Left message to call back, per [facility]

## 2016-11-18 NOTE — Telephone Encounter (Signed)
Patient stated that she did not need a call back because she made an appointment for tomorrow morning. She was advised to call if she needed any further help.

## 2016-11-19 ENCOUNTER — Telehealth: Payer: Self-pay | Admitting: Pulmonary Disease

## 2016-11-19 ENCOUNTER — Ambulatory Visit: Payer: Self-pay | Admitting: Physician Assistant

## 2016-11-19 ENCOUNTER — Ambulatory Visit (INDEPENDENT_AMBULATORY_CARE_PROVIDER_SITE_OTHER): Payer: Medicare Other | Admitting: Cardiology

## 2016-11-19 VITALS — BP 106/68 | HR 81 | Ht 65.0 in | Wt 116.8 lb

## 2016-11-19 DIAGNOSIS — R42 Dizziness and giddiness: Secondary | ICD-10-CM | POA: Diagnosis not present

## 2016-11-19 DIAGNOSIS — I2510 Atherosclerotic heart disease of native coronary artery without angina pectoris: Secondary | ICD-10-CM

## 2016-11-19 DIAGNOSIS — I208 Other forms of angina pectoris: Secondary | ICD-10-CM

## 2016-11-19 DIAGNOSIS — Z9861 Coronary angioplasty status: Secondary | ICD-10-CM | POA: Diagnosis not present

## 2016-11-19 DIAGNOSIS — R5381 Other malaise: Secondary | ICD-10-CM | POA: Diagnosis not present

## 2016-11-19 DIAGNOSIS — R002 Palpitations: Secondary | ICD-10-CM | POA: Diagnosis not present

## 2016-11-19 DIAGNOSIS — E785 Hyperlipidemia, unspecified: Secondary | ICD-10-CM | POA: Diagnosis not present

## 2016-11-19 DIAGNOSIS — R739 Hyperglycemia, unspecified: Secondary | ICD-10-CM | POA: Diagnosis not present

## 2016-11-19 DIAGNOSIS — I10 Essential (primary) hypertension: Secondary | ICD-10-CM

## 2016-11-19 DIAGNOSIS — R5382 Chronic fatigue, unspecified: Secondary | ICD-10-CM

## 2016-11-19 MED ORDER — METOPROLOL SUCCINATE ER 25 MG PO TB24: 25.0000 mg | ORAL_TABLET | Freq: Every evening | ORAL | 3 refills | Status: DC

## 2016-11-19 NOTE — Progress Notes (Signed)
I agree with the above note, plan 

## 2016-11-19 NOTE — Telephone Encounter (Signed)
Spoke with patient. She was confused about which office had called her with results. Explained to patient that no one from this office had called her. It was actually her GI doctor. Advised her to contact their office in the morning. Patient verbalized understanding. Nothing further was needed during call.

## 2016-11-19 NOTE — Patient Instructions (Addendum)
MEDICATION TONIGHT  TAKE CARVEDILOL  1/2 TABLET OF 6.25 MG.  TOMORROW ( Wednesday) TAKE1/2 TABLET OF CARVEDILOL IN THE MORNING AND IN THE EVENING  NEXT DAY  ( Thursday) IN THE MORNING TAKE 1/2 TABLET OF CARVEDILOL AND THEN STOP TAKING.  IN THE EVENING START METOPROLOL SUCCINATE 25 MG ONE TABLET  DAILY EVERY EVENING      WHEN YOU HAVE PALPATION  USE YOUR KLONOPIN-- 1/2 TABLET IF NEEDED  INSTEAD OF USING  METOPROLOL OR ANY OTHER BETA BLOCKER.    LABS IN ONE MONTH  - [facility]  LIPID CMP HGB A1C DO NOT EAT OR DRINK THE MORNING OF THE TEST  MAY COME BACK TO OFFICE FOR LAB WORK . YOU DO NOT NEED AN APPOINTMENT FOR LAB WORK --OPENS AT 8AM.    Your physician recommends that you schedule a follow-up appointment in [place].   If you need a refill on your cardiac medications before your next appointment, please call your pharmacy.
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
## 2016-11-20 ENCOUNTER — Telehealth: Payer: Self-pay

## 2016-11-20 ENCOUNTER — Encounter: Payer: Self-pay | Admitting: Cardiology

## 2016-11-20 ENCOUNTER — Telehealth: Payer: Self-pay | Admitting: Cardiology

## 2016-11-20 DIAGNOSIS — R42 Dizziness and giddiness: Secondary | ICD-10-CM | POA: Insufficient documentation

## 2016-11-20 NOTE — Assessment & Plan Note (Signed)
Amongst her main symptom she does have some intermittent palpitations. Nothing is borderline to be anything suggestive of an arrhythmia. For now I would not evaluate further.

## 2016-11-20 NOTE — Telephone Encounter (Signed)
New message    Pt verbalized that she wants to speak to the rn about the carvedilol medication that  Dr.Harding took her off and put her on metoprolol and she wants to know if she will be put on any other heart medication

## 2016-11-20 NOTE — Assessment & Plan Note (Signed)
Multiple PCIS, now is essentially occluded distal circumflex system after failed PCI's. Last Myoview in September 2017 was negative for ischemia which would suggest that the inferolateral walls getting flow from some rales Limited as to how we treat her. She is on pravastatin and carvedilol. She takes Plavix but no aspirin. She is on Imdur, but does not have blood pressure room for anything else.

## 2016-11-20 NOTE — Telephone Encounter (Signed)
I received a phone call from Ms. Michele [name]. She reports "bad headaches and hurting all over". She has asked if she should be evaluated sooner for these issues than August with a MRI Brain.  I relayed this information to Dr. Isidore Moos. She has informed Dr. Earlie Server of these symptoms as Michele [name] has an appointment with him on 11/28/16 to get the results from a CT Chest on 11/26/16. I explained to Michele [name] that Dr. Earlie Server will evaluate her at his appointment. Dr. Isidore Moos wants to avoid unnecessary exposure to more frequent MRI's than already scheduled as excessive scanning is not in her best interest. Her past scans to evaluate headaches have been normal.She had several questions about her CT Chest, and labs. At the end of our conversation I transferred her to Radiology to have her questions answered. She voiced her understanding of our conversation and knows to call if she has any further questions.

## 2016-11-20 NOTE — Assessment & Plan Note (Signed)
No longer an issue with her. She now is trouble with almost hypotension. I will switch her from carvedilol at 6.25 mg twice a day to equivalent lower dose of Toprol at 25 mg daily.

## 2016-11-20 NOTE — Assessment & Plan Note (Signed)
She is at least tolerating Pravachol. She has not had labs checked in over a year despite having have been ordered. We will order fasting lipid panel with chemistries to be done next month

## 2016-11-20 NOTE — Assessment & Plan Note (Signed)
At this point, I think we need to allow for permissive hypertension. Since she is having some palpitations, we want to stay on a beta blocker, but she cannot take a when necessary dose with the current dose of carvedilol. Exelon think at this point I would try to avoid the U[place] pounds of twice a day dosing of carvedilol and convert to a once daily Toprol with still the ability to use when necessary Lopressor, and likely avoid hypotension

## 2016-11-20 NOTE — Assessment & Plan Note (Addendum)
Relatively well-controlled with beta blocker and Imdur. I don't think she is actually having anginal symptoms. 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

## 2016-11-20 NOTE — Telephone Encounter (Signed)
Returned call after review of chart and recent visit. Pt concerned that she "won't be on any heart medication" after Thursday. I explained that metoprolol succ is a comparable medication --a beta blocker like carvedilol - and reaffirmed instructions for dosing including her tapering of carvedilol. We also discussed PRN use of klonopin. We discussed at length and in detail. Patient verbalized understanding of instructions.

## 2016-11-20 NOTE — Assessment & Plan Note (Addendum)
Chronic issue with her. I don't think we can get anywhere different with this. I think assessed to do with depression, now exacerbated by having cancer. Routine background now unless she is started on an SSRI. She is even scheduled to take her Klonopin  For now the plan will be to try to allow for some mild permissive hypertension.

## 2016-11-21 ENCOUNTER — Other Ambulatory Visit: Payer: Self-pay | Admitting: Radiation Therapy

## 2016-11-21 ENCOUNTER — Telehealth: Payer: Self-pay | Admitting: Cardiology

## 2016-11-21 DIAGNOSIS — C3491 Malignant neoplasm of unspecified part of right bronchus or lung: Secondary | ICD-10-CM

## 2016-11-21 NOTE — Telephone Encounter (Signed)
New message      Pt c/o medication issue:  1. Name of Medication:  metoprolol 2. How are you currently taking this medication (dosage and times per day)?  25mg   3. Are you having a reaction (difficulty breathing--STAT)?  no 4. What is your medication issue?  Pt will start taking medication tonight.  She has questions about this medication.  Please call before noon

## 2016-11-21 NOTE — Telephone Encounter (Signed)
Left message for pt to call.

## 2016-11-25 ENCOUNTER — Encounter: Payer: Medicare Other | Attending: Family Medicine | Admitting: Dietician

## 2016-11-25 ENCOUNTER — Encounter: Payer: Self-pay | Admitting: Dietician

## 2016-11-25 ENCOUNTER — Ambulatory Visit: Payer: Self-pay | Admitting: Physician Assistant

## 2016-11-25 DIAGNOSIS — I10 Essential (primary) hypertension: Secondary | ICD-10-CM | POA: Insufficient documentation

## 2016-11-25 DIAGNOSIS — Z713 Dietary counseling and surveillance: Secondary | ICD-10-CM | POA: Insufficient documentation

## 2016-11-25 DIAGNOSIS — E785 Hyperlipidemia, unspecified: Secondary | ICD-10-CM | POA: Diagnosis not present

## 2016-11-25 DIAGNOSIS — R634 Abnormal weight loss: Secondary | ICD-10-CM | POA: Diagnosis not present

## 2016-11-25 DIAGNOSIS — R7303 Prediabetes: Secondary | ICD-10-CM | POA: Diagnosis not present

## 2016-11-25 DIAGNOSIS — Z87891 Personal history of nicotine dependence: Secondary | ICD-10-CM | POA: Insufficient documentation

## 2016-11-25 DIAGNOSIS — E440 Moderate protein-calorie malnutrition: Secondary | ICD-10-CM

## 2016-11-25 DIAGNOSIS — K5900 Constipation, unspecified: Secondary | ICD-10-CM | POA: Insufficient documentation

## 2016-11-25 DIAGNOSIS — R1013 Epigastric pain: Secondary | ICD-10-CM | POA: Insufficient documentation

## 2016-11-25 DIAGNOSIS — J449 Chronic obstructive pulmonary disease, unspecified: Secondary | ICD-10-CM | POA: Diagnosis not present

## 2016-11-25 DIAGNOSIS — R109 Unspecified abdominal pain: Secondary | ICD-10-CM | POA: Insufficient documentation

## 2016-11-25 DIAGNOSIS — F329 Major depressive disorder, single episode, unspecified: Secondary | ICD-10-CM | POA: Diagnosis not present

## 2016-11-25 NOTE — Patient Instructions (Signed)
Avoid over restricting.  No food rules currently.  Avoid laying down for 2-3 hours after eating. Breakfast, lunch, dinner daily. Morning and afternoon snack daily. Consider adding avocado, nuts, olive oil and others to increase your calories.  Breakfast:   Cereal, fruit, almond milk, toast, peanut butter Eggs, toast, peanut butter, fruit Eggs, waffles, peanut butter, fruit  Lunch: Tuna or sardines and crackers, fruit Soup and sandwich Sandwich (cheese, tuna, deli meat, egg salad), fruit  Dinner: Meat, potato or sweet potato, squash casserole Pintos or lentils, brown rice, cornbread, greens Pot roast, potatoes, carrots  1-2 nutritional supplements daily.  These can be put into smoothies.  Consider a protein powder  Snack ideas:  Nuts  Smoothies  Protein shakes  Peanut butter or cheese on crackers  Bread with peanut butter and banana  hummus with vegetables or crackers

## 2016-11-25 NOTE — Progress Notes (Signed)
Medical Nutrition Therapy:  Appt start time: 1115 end time:  5638.   Assessment:  Primary concerns today: Patient is here with her husband.  She would like to learn what to eat to prevent further weight loss.  Hx includes prediabetes and she is also concerned about developing diabetes.  Other hx includes constipation.  Today she complains of weight loss, sore mouth, sore glands and stomach and lung pain.  Treatment for small cell cancer of the lung was completed in December 2017 (chemo and radiation).  She has had decreased appetite since that time and has been losing weight.  She verbalized many food rules and avoids food if it has sugar and other ingredients that she has read are bad for cancer.  She is no longer smoking.  Additional hx include GERD, COPD, HTN, and hyperlipidemia.  She also has depression.  Weight hx: 140 lbs 02/2016 prior to cancer diagnosis and treatment 125 lbs 05/2017 after cancer treatment 119 lbs 1 month ago Patient with an 18% weight loss in the past 9 months, 8% within the last 6 months, 3% within the past 3 months.  Nutrition Focused Physical Exam performed.    Below the eye WNL  Severe hollowing, scooping depression at temple region.  Mild-moderate decrease in muscle at clavicle  Protrusion of of acromion  Ample fat at triceps/bicepts but decreased muscle noted  Normal muscle at thumb  No noted edema  Severely decreased calf muscle  Mild-moderately apparent ribs  Patient meets criteria for mild-moderate malnutrition.   Patient lives with her husband and mentally impaired brother.  Patient does most of the cooking and they both shop.   Preferred Learning Style:   No preference indicated   Learning Readiness:   Contemplating   MEDICATIONS: see list   DIETARY INTAKE:  Usual eating pattern includes 2-3 meals and 1-2 snacks per day. Avoided foods include dairy.   Her intake is very good when she eats at a buffet and poor when she cooks for  herself.  24-hr recall: (wakes early) B (9-10 AM): honey nut cheerios, almond milk, banana or strawberries, 100% [employer] toast with peanut butter OR occasional egg beaters, pepper, Waffle with peanut butter, fruit OR 2 eggs, grits, [employer] toast  OR sausage biscuit or gravy sausage biscuit fast food Snk ( AM): Ensure when she can afford OR smoothie with kale, strawberries, water, peanut butter L ( PM): SKIPS at times OR out to eat buffet (3 plates) Snk ( PM): banana or ensure or smoothie D ( PM): Catfish nuggets, salad, hush puppies Snk ( PM): none Beverages: water, occasional Ensure  Usual physical activity: too weak to walk and too hot outside  Estimated energy needs: 2000 calories 70-80 g protein  Progress Towards Goal(s):  In progress.   Nutritional Diagnosis:  NI-5.2 Malnutrition As related to poor appetite and restrictive eating.  As evidenced by decrease in muscle mass and body weight.    Intervention:  Nutrition counseling/education related to nutrition with goal of weight gain.  Discussed her current eating habits, need to avoid restrictive behavior and eat what she wishes.  Discussed need for regularly scheduled meals and snacks.  Tips to increase calories and protein as well as meal ideas provided.  Discussed her current concerns about prediabetes and her blood sugar as well as need to liberalize her diet to improve energy, strength and improve malnutrition. Encouraged patient to continue to drink adequate fluid to maintain adequate hydration and help with constipation.  Avoid over restricting.  No food rules currently.  Avoid laying down for 2-3 hours after eating. Breakfast, lunch, dinner daily. Morning and afternoon snack daily. Consider adding avocado, nuts, olive oil and others to increase your calories.  Breakfast:   Cereal, fruit, almond milk, toast, peanut butter Eggs, toast, peanut butter, fruit Eggs, waffles, peanut butter, fruit  Lunch: Tuna or sardines and crackers,  fruit Soup and sandwich Sandwich (cheese, tuna, deli meat, egg salad), fruit  Dinner: Meat, potato or sweet potato, squash casserole Pintos or lentils, brown rice, cornbread, greens Pot roast, potatoes, carrots  1-2 nutritional supplements daily.  These can be put into smoothies.  Consider a protein powder  Snack ideas:  Nuts  Smoothies  Protein shakes  Peanut butter or cheese on crackers  Bread with peanut butter and banana  hummus with vegetables or crackers   Teaching Method Utilized:  Visual Auditory  Handouts given during visit include:  Tips to increase calories and protein  High calorie nutrition therapy  Counselor list   Barriers to learning/adherence to lifestyle change: poor appetite, depression, poor energy.  Demonstrated degree of understanding via:  Teach Back   Monitoring/Evaluation:  Dietary intake, exercise, and body weight in 4 week(s).

## 2016-11-25 NOTE — Telephone Encounter (Signed)
Spoke with pt, questions regarding metoprolol answered. Questions regarding upcoming lab work answered.

## 2016-11-26 ENCOUNTER — Telehealth: Payer: Self-pay | Admitting: Physician Assistant

## 2016-11-26 ENCOUNTER — Encounter (HOSPITAL_COMMUNITY): Payer: Self-pay

## 2016-11-26 ENCOUNTER — Other Ambulatory Visit (HOSPITAL_BASED_OUTPATIENT_CLINIC_OR_DEPARTMENT_OTHER): Payer: Medicare Other

## 2016-11-26 ENCOUNTER — Other Ambulatory Visit: Payer: Self-pay | Admitting: Internal Medicine

## 2016-11-26 ENCOUNTER — Ambulatory Visit (HOSPITAL_COMMUNITY)
Admission: RE | Admit: 2016-11-26 | Discharge: 2016-11-26 | Disposition: A | Discharge status: Home / Self Care | Payer: Medicare Other | Source: Ambulatory Visit | Attending: Internal Medicine | Admitting: Internal Medicine

## 2016-11-26 DIAGNOSIS — C3491 Malignant neoplasm of unspecified part of right bronchus or lung: Secondary | ICD-10-CM

## 2016-11-26 DIAGNOSIS — C3490 Malignant neoplasm of unspecified part of unspecified bronchus or lung: Secondary | ICD-10-CM | POA: Diagnosis not present

## 2016-11-26 DIAGNOSIS — J439 Emphysema, unspecified: Secondary | ICD-10-CM | POA: Diagnosis not present

## 2016-11-26 LAB — CBC WITH DIFFERENTIAL/PLATELET
BASO%: 0.4 % (ref 0.0–2.0)
Basophils Absolute: 0 10*3/uL (ref 0.0–0.1)
EOS%: 0.8 % (ref 0.0–7.0)
Eosinophils Absolute: 0 10*3/uL (ref 0.0–0.5)
HCT: 41.3 % (ref 34.8–46.6)
HGB: 14 g/dL (ref 11.6–15.9)
LYMPH%: 10.3 % — ABNORMAL LOW (ref 14.0–49.7)
MCH: 32.1 pg (ref 25.1–34.0)
MCHC: 33.8 g/dL (ref 31.5–36.0)
MCV: 94.9 fL (ref 79.5–101.0)
MONO#: 0.3 10*3/uL (ref 0.1–0.9)
MONO%: 7.4 % (ref 0.0–14.0)
NEUT#: 3.1 10*3/uL (ref 1.5–6.5)
NEUT%: 81.1 % — ABNORMAL HIGH (ref 38.4–76.8)
Platelets: 147 10*3/uL (ref 145–400)
RBC: 4.35 10*6/uL (ref 3.70–5.45)
RDW: 13.7 % (ref 11.2–14.5)
WBC: 3.8 10*3/uL — ABNORMAL LOW (ref 3.9–10.3)
lymph#: 0.4 10*3/uL — ABNORMAL LOW (ref 0.9–3.3)

## 2016-11-26 LAB — COMPREHENSIVE METABOLIC PANEL
ALT: 10 U/L (ref 0–55)
AST: 19 U/L (ref 5–34)
Albumin: 3.9 g/dL (ref 3.5–5.0)
Alkaline Phosphatase: 71 U/L (ref 40–150)
Anion Gap: 8 mEq/L (ref 3–11)
BUN: 14 mg/dL (ref 7.0–26.0)
CO2: 28 mEq/L (ref 22–29)
Calcium: 10.1 mg/dL (ref 8.4–10.4)
Chloride: 108 mEq/L (ref 98–109)
Creatinine: 0.7 mg/dL (ref 0.6–1.1)
EGFR: 88 mL/min/{1.73_m2} — ABNORMAL LOW (ref >90)
Glucose: 94 mg/dl (ref 70–140)
Potassium: 4.3 mEq/L (ref 3.5–5.1)
Sodium: 143 mEq/L (ref 136–145)
Total Bilirubin: 0.34 mg/dL (ref 0.20–1.20)
Total Protein: 6.5 g/dL (ref 6.4–8.3)

## 2016-11-26 MED ORDER — IOPAMIDOL (ISOVUE-300) INJECTION 61%: 75.0000 mL | Freq: Once | INTRAVENOUS | Status: DC | PRN

## 2016-11-26 MED ORDER — IOPAMIDOL (ISOVUE-300) INJECTION 61%
INTRAVENOUS | Status: AC
  Filled 2016-11-26: qty 75

## 2016-11-26 NOTE — Telephone Encounter (Signed)
Amy Esterwood, PA-C saw the patient last. Please see the note.

## 2016-11-26 NOTE — Telephone Encounter (Signed)
Left message on machine to call back  

## 2016-11-27 ENCOUNTER — Institutional Professional Consult (permissible substitution): Payer: Self-pay | Admitting: Pulmonary Disease

## 2016-11-27 NOTE — Telephone Encounter (Signed)
The pt was advised to try zantac at bedtime and avoid citrus, chocolate, mint, and was given anti reflux precautions.  She is taking her protonix 40 mg daily 20-30 min prior to breakfast. She will call if the zantac does not help

## 2016-11-28 ENCOUNTER — Encounter: Payer: Self-pay | Admitting: Internal Medicine

## 2016-11-28 ENCOUNTER — Ambulatory Visit (HOSPITAL_BASED_OUTPATIENT_CLINIC_OR_DEPARTMENT_OTHER): Payer: Medicare Other | Admitting: Internal Medicine

## 2016-11-28 ENCOUNTER — Other Ambulatory Visit: Payer: Self-pay | Admitting: Medical Oncology

## 2016-11-28 ENCOUNTER — Telehealth: Payer: Self-pay | Admitting: Internal Medicine

## 2016-11-28 VITALS — BP 105/60 | HR 78 | Temp 98.0°F | Resp 18 | Ht 65.5 in | Wt 115.5 lb

## 2016-11-28 DIAGNOSIS — C3491 Malignant neoplasm of unspecified part of right bronchus or lung: Secondary | ICD-10-CM

## 2016-11-28 DIAGNOSIS — R52 Pain, unspecified: Secondary | ICD-10-CM | POA: Diagnosis not present

## 2016-11-28 DIAGNOSIS — B370 Candidal stomatitis: Secondary | ICD-10-CM

## 2016-11-28 DIAGNOSIS — R5383 Other fatigue: Secondary | ICD-10-CM | POA: Diagnosis not present

## 2016-11-28 DIAGNOSIS — R51 Headache: Secondary | ICD-10-CM

## 2016-11-28 MED ORDER — MAGIC MOUTHWASH: 5.0000 mL | Freq: Four times a day (QID) | ORAL | 0 refills | Status: DC
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
## 2016-11-28 NOTE — Telephone Encounter (Signed)
Scheduled appt per 6/28 los - Gave patient AVS and calender per los.  

## 2016-11-29 ENCOUNTER — Telehealth: Payer: Self-pay

## 2016-11-29 NOTE — Telephone Encounter (Signed)
Pt reading the printout of her CT Chest. Pt is asking if she still has cancer. Does "no progression" mean the cancer is gone or just not growing? She feels this question was not answered in her visit yesterday.  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   Please call pt back, she is stating she does not get phone calls back.  She is aware Dr Julien Nordmann out of office until Monday.

## 2016-12-03 DIAGNOSIS — R070 Pain in throat: Secondary | ICD-10-CM | POA: Diagnosis not present

## 2016-12-03 DIAGNOSIS — H7203 Central perforation of tympanic membrane, bilateral: Secondary | ICD-10-CM | POA: Diagnosis not present

## 2016-12-03 DIAGNOSIS — H6983 Other specified disorders of Eustachian tube, bilateral: Secondary | ICD-10-CM | POA: Diagnosis not present

## 2016-12-05 DIAGNOSIS — R002 Palpitations: Secondary | ICD-10-CM | POA: Diagnosis not present

## 2016-12-05 DIAGNOSIS — E785 Hyperlipidemia, unspecified: Secondary | ICD-10-CM | POA: Diagnosis not present

## 2016-12-05 DIAGNOSIS — R739 Hyperglycemia, unspecified: Secondary | ICD-10-CM | POA: Diagnosis not present

## 2016-12-06 LAB — LIPID PANEL
Chol/HDL Ratio: 3 ratio (ref 0.0–4.4)
Cholesterol, Total: 137 mg/dL (ref 100–199)
HDL: 45 mg/dL (ref >39)
LDL Calculated: 72 mg/dL (ref 0–99)
Triglycerides: 98 mg/dL (ref 0–149)
VLDL Cholesterol Cal: 20 mg/dL (ref 5–40)

## 2016-12-06 LAB — COMPREHENSIVE METABOLIC PANEL
ALT: 7 IU/L (ref 0–32)
AST: 17 IU/L (ref 0–40)
Albumin/Globulin Ratio: 2.5 — ABNORMAL HIGH (ref 1.2–2.2)
Albumin: 4.3 g/dL (ref 3.6–4.8)
Alkaline Phosphatase: 69 IU/L (ref 39–117)
BUN/Creatinine Ratio: 27 (ref 12–28)
BUN: 16 mg/dL (ref 8–27)
Bilirubin Total: 0.4 mg/dL (ref 0.0–1.2)
CO2: 24 mmol/L (ref 20–29)
Calcium: 9.6 mg/dL (ref 8.7–10.3)
Chloride: 105 mmol/L (ref 96–106)
Creatinine, Ser: 0.59 mg/dL (ref 0.57–1.00)
GFR calc Af Amer: 114 mL/min/{1.73_m2} (ref >59)
GFR calc non Af Amer: 99 mL/min/{1.73_m2} (ref >59)
Globulin, Total: 1.7 g/dL (ref 1.5–4.5)
Glucose: 108 mg/dL — ABNORMAL HIGH (ref 65–99)
Potassium: 4.5 mmol/L (ref 3.5–5.2)
Sodium: 144 mmol/L (ref 134–144)
Total Protein: 6 g/dL (ref 6.0–8.5)

## 2016-12-06 LAB — HEMOGLOBIN A1C
Est. average glucose Bld gHb Est-mCnc: 105 mg/dL
Hgb A1c MFr Bld: 5.3 % (ref 4.8–5.6)

## 2016-12-11 ENCOUNTER — Telehealth: Payer: Self-pay | Admitting: Medical Oncology

## 2016-12-11 NOTE — Telephone Encounter (Signed)
err

## 2016-12-11 NOTE — Telephone Encounter (Signed)
Will be happy to order a PET scan but likely will be denied by her insurance because it is not indicated in the absence of disease recurrence or progression. I am ok to do it if she would like to pay for it.

## 2016-12-11 NOTE — Telephone Encounter (Signed)
Pt requesting PET scan for bones hurting, no appetite. Note to [place].

## 2016-12-11 NOTE — Telephone Encounter (Signed)
From: Curt Bears, MD  Sent: 11/29/2016  3:45 PM  To: Lucile Crater, RN, Ardeen Garland, RN  Subject: RE: ct result question              She still has very small Lymph nodes in the chest. We do not know if it has any live cancer cells or just scars from previous treatment. No need to do anything unless they start increase in size.  Saccular aneurysm is very small but if she is concerned about, she can discuss with her PCP. This is not an oncology issue.   called to pt.

## 2016-12-12 ENCOUNTER — Ambulatory Visit (INDEPENDENT_AMBULATORY_CARE_PROVIDER_SITE_OTHER)
Admission: RE | Admit: 2016-12-12 | Discharge: 2016-12-12 | Disposition: A | Discharge status: Home / Self Care | Payer: Medicare Other | Source: Ambulatory Visit | Attending: Acute Care | Admitting: Acute Care

## 2016-12-12 ENCOUNTER — Telehealth: Payer: Self-pay | Admitting: Cardiology

## 2016-12-12 ENCOUNTER — Encounter: Payer: Self-pay | Admitting: Acute Care

## 2016-12-12 ENCOUNTER — Ambulatory Visit (INDEPENDENT_AMBULATORY_CARE_PROVIDER_SITE_OTHER): Payer: Medicare Other | Admitting: Acute Care

## 2016-12-12 VITALS — BP 130/64 | HR 70 | Ht 65.5 in | Wt 116.8 lb

## 2016-12-12 DIAGNOSIS — J438 Other emphysema: Secondary | ICD-10-CM

## 2016-12-12 DIAGNOSIS — J449 Chronic obstructive pulmonary disease, unspecified: Secondary | ICD-10-CM | POA: Diagnosis not present

## 2016-12-12 DIAGNOSIS — C3491 Malignant neoplasm of unspecified part of right bronchus or lung: Secondary | ICD-10-CM

## 2016-12-12 DIAGNOSIS — R1310 Dysphagia, unspecified: Secondary | ICD-10-CM | POA: Diagnosis not present

## 2016-12-12 DIAGNOSIS — G8929 Other chronic pain: Secondary | ICD-10-CM

## 2016-12-12 DIAGNOSIS — I208 Other forms of angina pectoris: Secondary | ICD-10-CM

## 2016-12-12 DIAGNOSIS — R079 Chest pain, unspecified: Secondary | ICD-10-CM | POA: Diagnosis not present

## 2016-12-12 DIAGNOSIS — M545 Low back pain: Secondary | ICD-10-CM | POA: Diagnosis not present

## 2016-12-12 DIAGNOSIS — R05 Cough: Secondary | ICD-10-CM | POA: Diagnosis not present

## 2016-12-12 NOTE — Assessment & Plan Note (Signed)
Follow up with radiation. oncology as scheduled MRI brain as is scheduled
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
## 2016-12-12 NOTE — Telephone Encounter (Signed)
LMTCB

## 2016-12-12 NOTE — Assessment & Plan Note (Addendum)
Continued complaints of dyspnea with exertion Saturations 100% after ambulating to the room States Trelegy made her heart race Plan Continue using your Spiriva once daily as you have been doing. CXR today, we will call you with results. Consider adding claritin 10 mg ( loratadine) once daily for post nasal drip. We will prescribe Xopenex rescue inhaler. We will see if your insurance needs a pre auth Alpha 1 testing with next blood draw We will refer for Pulmonary Rehab( Dr. Halford Chessman) Please check with Dr. Earlie Server and Dr. Isidore Moos about getting the pneumococcal vaccine. Consider PFT's when patient is stronger. She states she would not be able to perform these at present. Follow up with Dr. Isidore Moos as scheduled Follow up with Dr. Earlie Server as scheduled Follow up with Dr. [name] as scheduled. Follow up with Dr. Halford Chessman in 4 months. Please contact office for sooner follow up if symptoms do not improve or worsen or seek emergency care

## 2016-12-12 NOTE — Assessment & Plan Note (Signed)
Follow up with PCP regarding chronic back pain.

## 2016-12-12 NOTE — Progress Notes (Signed)
I have reviewed and agree with assessment/plan.  [name], MD [facility] Pulmonary/Critical Care 12/12/2016, 8:41 PM Pager:  [phone]

## 2016-12-12 NOTE — Assessment & Plan Note (Signed)
Follow up with Dr. Isidore Moos as suggested at 10/31/16 visit

## 2016-12-12 NOTE — Telephone Encounter (Signed)
Michele [name] is calling to find out which two medications in which Dr.Harding switched her to . Please call .[name] Thanks

## 2016-12-12 NOTE — Patient Instructions (Addendum)
It is nice to meet you today. Continue using your Spiriva once daily as you have been doing. CXR today, we will call you with results. Consider adding claritin 10 mg ( loratadine) once daily for post nasal drip. We will prescribe Xopenex rescue inhaler. This may be less stimulating to your heart. We will see if your insurance needs a pre auth Alpha 1 testing with next blood draw We will refer for Pulmonary Rehab Please check with Dr. Earlie Server and Dr. Isidore Moos about getting the pneumococcal vaccine. Follow up with Dr. Isidore Moos as scheduled Follow up with Dr. Earlie Server as scheduled Follow up with Dr. [name] as scheduled. Follow up with Dr. Halford Chessman in 4 months. Consider PFT's when patient is stronger. Please contact office for sooner follow up if symptoms do not improve or worsen or seek emergency care

## 2016-12-13 ENCOUNTER — Other Ambulatory Visit: Payer: Self-pay | Admitting: Medical Oncology

## 2016-12-13 NOTE — Telephone Encounter (Signed)
Spoke with pt, questions regarding medications answered. 

## 2016-12-13 NOTE — Telephone Encounter (Addendum)
She reqeusts a PET scan at [facility] in sept instead of ct scan. Note to Leslie.

## 2016-12-16 NOTE — Telephone Encounter (Signed)
[facility] does not have PET scan, Only at [place].

## 2016-12-20 ENCOUNTER — Telehealth: Payer: Self-pay

## 2016-12-20 NOTE — Telephone Encounter (Signed)
Pt is asking about the PET. She was asking to have a PET rather than a CT. She is requesting this d/t she is losing weight still, her bones and muscles hurt, extra skin (something) growing on her hands. She has had another MD tell her this could be indicative of something going on in her pancreas. She is also still really weak.   See telephone note 6/29. No PET ordered yet. Please call pt when ordered (per pt)

## 2016-12-23 ENCOUNTER — Ambulatory Visit: Payer: Self-pay | Admitting: Dietician

## 2016-12-23 ENCOUNTER — Other Ambulatory Visit: Payer: Self-pay | Admitting: Internal Medicine

## 2016-12-23 DIAGNOSIS — C3491 Malignant neoplasm of unspecified part of right bronchus or lung: Secondary | ICD-10-CM

## 2016-12-23 NOTE — Progress Notes (Signed)
PET

## 2016-12-23 NOTE — Telephone Encounter (Signed)
Left message that PET scan ordered for 8/23 at [facility].

## 2016-12-26 ENCOUNTER — Encounter: Payer: Medicare Other | Attending: Family Medicine | Admitting: Dietician

## 2016-12-26 DIAGNOSIS — R109 Unspecified abdominal pain: Secondary | ICD-10-CM | POA: Diagnosis not present

## 2016-12-26 DIAGNOSIS — E440 Moderate protein-calorie malnutrition: Secondary | ICD-10-CM

## 2016-12-26 DIAGNOSIS — E785 Hyperlipidemia, unspecified: Secondary | ICD-10-CM | POA: Insufficient documentation

## 2016-12-26 DIAGNOSIS — I10 Essential (primary) hypertension: Secondary | ICD-10-CM | POA: Insufficient documentation

## 2016-12-26 DIAGNOSIS — Z713 Dietary counseling and surveillance: Secondary | ICD-10-CM | POA: Diagnosis not present

## 2016-12-26 DIAGNOSIS — R7303 Prediabetes: Secondary | ICD-10-CM | POA: Insufficient documentation

## 2016-12-26 DIAGNOSIS — R1013 Epigastric pain: Secondary | ICD-10-CM | POA: Insufficient documentation

## 2016-12-26 DIAGNOSIS — Z87891 Personal history of nicotine dependence: Secondary | ICD-10-CM | POA: Insufficient documentation

## 2016-12-26 DIAGNOSIS — K5900 Constipation, unspecified: Secondary | ICD-10-CM | POA: Insufficient documentation

## 2016-12-26 DIAGNOSIS — R634 Abnormal weight loss: Secondary | ICD-10-CM | POA: Diagnosis not present

## 2016-12-26 DIAGNOSIS — F329 Major depressive disorder, single episode, unspecified: Secondary | ICD-10-CM | POA: Insufficient documentation

## 2016-12-26 DIAGNOSIS — J449 Chronic obstructive pulmonary disease, unspecified: Secondary | ICD-10-CM | POA: Insufficient documentation

## 2016-12-26 NOTE — Patient Instructions (Addendum)
Things to consider to increase appetite.-  Discuss with your MD  Light exercise, light weights, [place] chi (Any approved by MD)  Antidepressant (such as Remeron)  Appetite stimulant (are risks worth benefit) Consider Counseling 3 meals and 2-3 snacks daily.  Avoid skipping meals. Add protein to your smoothies (Have a smoothie or Boost 1-2 times daily).  Whey protein or greek yogurt or Boost

## 2016-12-26 NOTE — Progress Notes (Signed)
Medical Nutrition Therapy:  Appt start time: 1115 end time:  1200.   Assessment:  11/25/16 Primary concerns today: Patient is here with her husband.  She would like to learn what to eat to prevent further weight loss.  Hx includes prediabetes and she is also concerned about developing diabetes.  Other hx includes constipation.  Today she complains of weight loss, sore mouth, sore glands and stomach and lung pain.  Treatment for small cell cancer of the lung was completed in December 2017 (chemo and radiation).  She has had decreased appetite since that time and has been losing weight.  She verbalized many food rules and avoids food if it has sugar and other ingredients that she has read are bad for cancer.  She is no longer smoking.  Additional hx include GERD, COPD, HTN, and hyperlipidemia.  She also has depression.  Weight hx: 140 lbs 02/2016 prior to cancer diagnosis and treatment 125 lbs 05/2017 after cancer treatment 119 lbs 1 month ago Patient with an 18% weight loss in the past 9 months, 8% within the last 6 months, 3% within the past 3 months.  Nutrition Focused Physical Exam performed.    Below the eye WNL  Severe hollowing, scooping depression at temple region.  Mild-moderate decrease in muscle at clavicle  Protrusion of of acromion  Ample fat at triceps/bicepts but decreased muscle noted  Normal muscle at thumb  No noted edema  Severely decreased calf muscle  Mild-moderately apparent ribs  Patient meets criteria for mild-moderate malnutrition.   Patient lives with her husband and mentally impaired brother.  Patient does most of the cooking and they both shop.   12/28/16: Patient is here today with her husband.  Complaints include poor appetite.  She is eating a little better and her weight is maintained at 115.8 lbs today.  She continues to be depressed.  She had counseling briefly at the cancer center but is resistant to trying this again. She is drinking 8 cups water daily  plus other fluid and states that she feels dehydrated.  She states that she feels weak, tired all of the time and concerned that she can't gain weight.  Last oncology note Limited stage small cell lung cancer stable with no mets.  Her last A1C was 5.3% 12/05/16.  She continues to think of her diet restrictively avoiding foods that have more carbohydrates or sugar.  Preferred Learning Style:   No preference indicated   Learning Readiness:   Contemplating   MEDICATIONS: see list   DIETARY INTAKE:  Usual eating pattern includes 2-3 meals and 1-2 snacks per day. Avoided foods include dairy.   Her intake is very good when she eats at a buffet and poor when she cooks for herself.  24-hr recall: (wakes early) B (9-10 AM): honey nut cheerios, almond milk, banana or strawberries, 100% [employer] toast with peanut butter OR occasional egg beaters, pepper, Waffle with peanut butter, fruit OR 2 eggs, grits, [employer] toast  OR sausage biscuit or gravy sausage biscuit fast food Snk ( AM): Ensure when she can afford OR smoothie with kale, strawberries, water, peanut butter L ( PM):[employer] fillet sandwich Snk ( PM): banana or boost glucose control. or smoothie, nuts or fruit D ( PM): Catfish nuggets, salad, hush puppies Snk ( PM): none Beverages: water, occasional Ensure  Usual physical activity: too weak to walk and too hot outside  Estimated energy needs: 2000 calories 70-80 g protein  Progress Towards Goal(s):  In progress.   Nutritional  Diagnosis:  NI-5.2 Malnutrition As related to poor appetite and restrictive eating.  As evidenced by decrease in muscle mass and body weight.    Intervention:  Nutrition counseling/education continued related to nutrition with goal of weight gain.  Discussed her current eating habits, need to avoid restrictive behavior and eat what she wishes.  Discussed need for regularly scheduled meals and snacks.  Tips to increase calories and protein as well as meal ideas provided.   Discussed her current concerns about prediabetes and her blood sugar as well as need to liberalize her diet to improve energy, strength and improve malnutrition. Encouraged patient to continue to drink adequate fluid to maintain adequate hydration and help with constipation.  Avoid over restricting.  No food rules currently. Things to consider to increase appetite.-  Discuss with your MD  Light exercise, light weights, [place] chi (Any approved by MD)  Antidepressant (such as Remeron)  Appetite stimulant (are risks worth benefit) Consider Counseling 3 meals and 2-3 snacks daily.  Avoid skipping meals. Add protein to your smoothies (Have a smoothie or Boost 1-2 times daily).  Whey protein or greek yogurt or Boost   Avoid laying down for 2-3 hours after eating. Breakfast, lunch, dinner daily. Morning and afternoon snack daily. Consider adding avocado, nuts, olive oil and others to increase your calories.  Breakfast:   Cereal, fruit, almond milk, toast, peanut butter Eggs, toast, peanut butter, fruit Eggs, waffles, peanut butter, fruit  Lunch: Tuna or sardines and crackers, fruit Soup and sandwich Sandwich (cheese, tuna, deli meat, egg salad), fruit  Dinner: Meat, potato or sweet potato, squash casserole Pintos or lentils, brown rice, cornbread, greens Pot roast, potatoes, carrots  1-2 nutritional supplements daily.  These can be put into smoothies.  Consider a protein powder  Snack ideas:  Nuts  Smoothies  Protein shakes  Peanut butter or cheese on crackers  Bread with peanut butter and banana  hummus with vegetables or crackers  Teaching Method Utilized:  Visual Auditory  Handouts given during initial visit include: and smoothie recipes today.  Tips to increase calories and protein  High calorie nutrition therapy  Counselor list   Barriers to learning/adherence to lifestyle change: poor appetite, depression, poor energy.  Demonstrated degree of understanding via:   Teach Back   Monitoring/Evaluation:  Dietary intake, exercise, and body weight prn.

## 2017-01-01 ENCOUNTER — Other Ambulatory Visit: Payer: Self-pay | Admitting: Cardiology

## 2017-01-01 ENCOUNTER — Telehealth: Payer: Self-pay | Admitting: Cardiology

## 2017-01-01 NOTE — Telephone Encounter (Signed)
New Message  Pt c/o medication issue:  1. Name of Medication: Vascepa   2. How are you currently taking this medication (dosage and times per day)? 1g  3. Are you having a reaction (difficulty breathing--STAT)? Bruising   4. What is your medication issue? Per pt would like to know if it would be okay to stop medication. Please call back to discuss

## 2017-01-01 NOTE — Telephone Encounter (Signed)
I did not Rx Vascepa, but understand why it was prescribed.   "blood thinning" / bruising is not listed as an adverse effect of this medicine. -- Bruising is more related to Plavix - which is there to prevent her existing stents from closing -- if she has a bad bruise, she can hold it for a few days (I have told her this several times).  Every medicine has a possible adverse reaction -- this does not mean that we should stop them. Please tell her that if she continues to find issues with every single medication that we are trying to use, we have not option but to discontinue medications & hope for the best.    Glenetta Hew, MD

## 2017-01-01 NOTE — Telephone Encounter (Signed)
Spoke with pt she states that she was told by her dietician she Vascepa would thin her blood and she would like to stop taking it. She states that her bruising has increased lately. Informed pt that this does not mean that it is an anticoagulant, informed pt that this is for trig's not anticoagulation. She states that she would still like to stop because of what the dietician said. Please advise

## 2017-01-07 NOTE — Telephone Encounter (Signed)
SPOKE TO PATIENT . SHE IS AWARE NOT TO STOP VASCEPA.   MAY HOLD PLAVIX IF NEEDED FOR 2-3 DAYS.   PATIENT STATES SHE  HAS FAST HEARTBEAT OCCASIONALLY . RN INFORMED HER TAKE METOPROLOL AT BEDTIME INSTEAD OF DINNER TIME.

## 2017-01-12 IMAGING — US US ABDOMEN LIMITED
1 series · 14 of 25 positions shown · non-contrast
Comparison: Abdominal ultrasound 01/30/2012. CT abdomen and pelvis
08/10/2013.

CLINICAL DATA: Right upper quadrant abdominal pain and nausea.

EXAM:
US ABDOMEN LIMITED - RIGHT UPPER QUADRANT

[Series 1: us abdomen limited · 0.23mm/px · 14 of 46 slices shown]
[im 1/46]
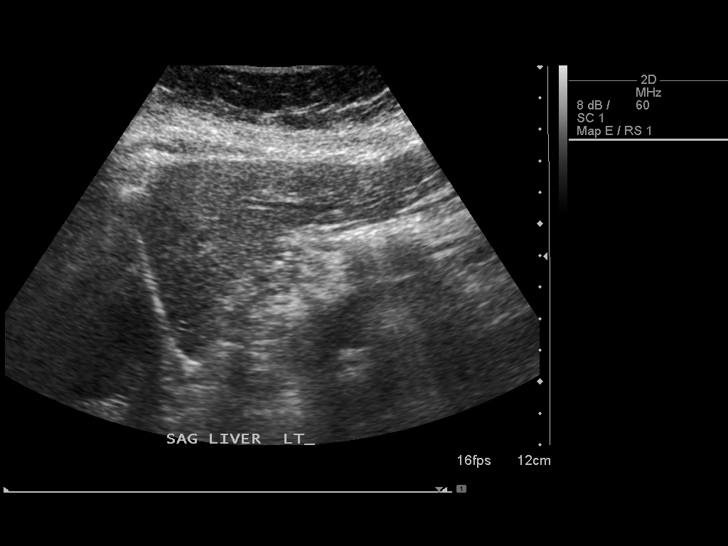
[im 4/46]
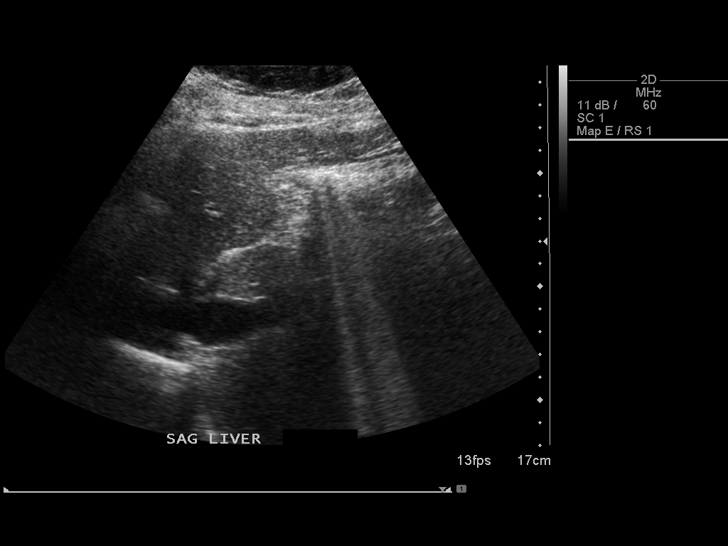
[im 8/46]
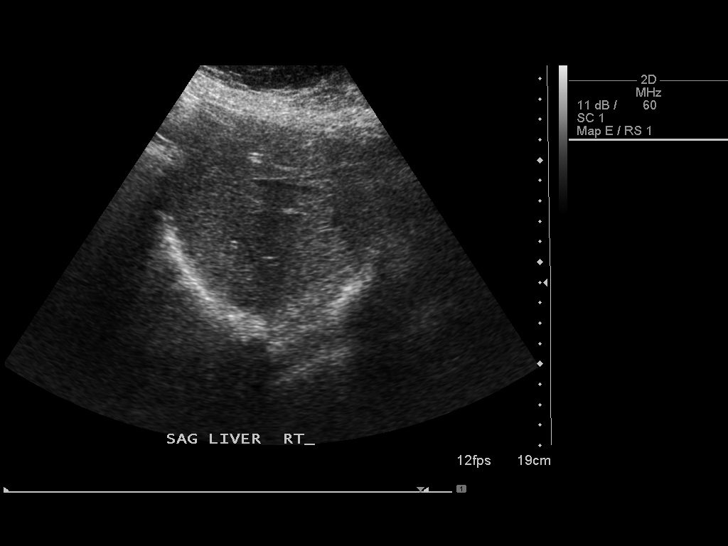
[im 12/46]
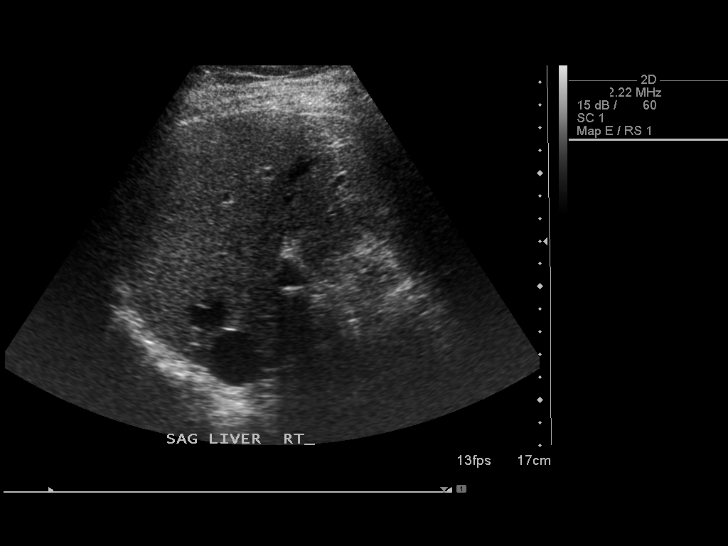
[im 16/46]
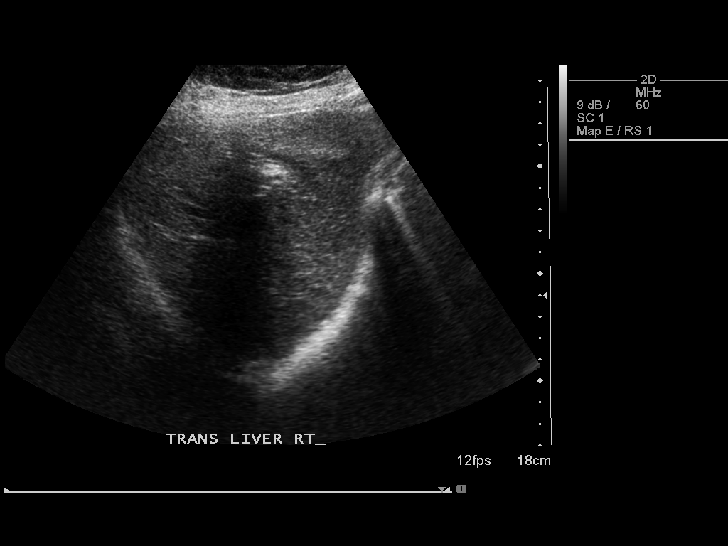
[im 17/46]
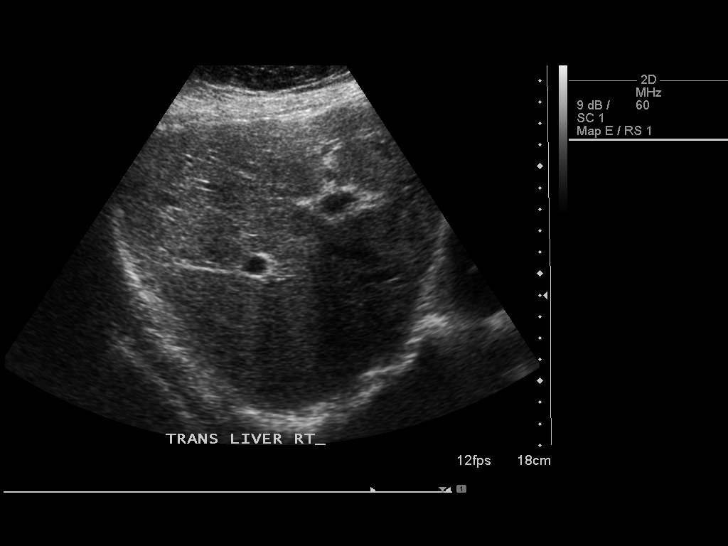
[im 21/46]
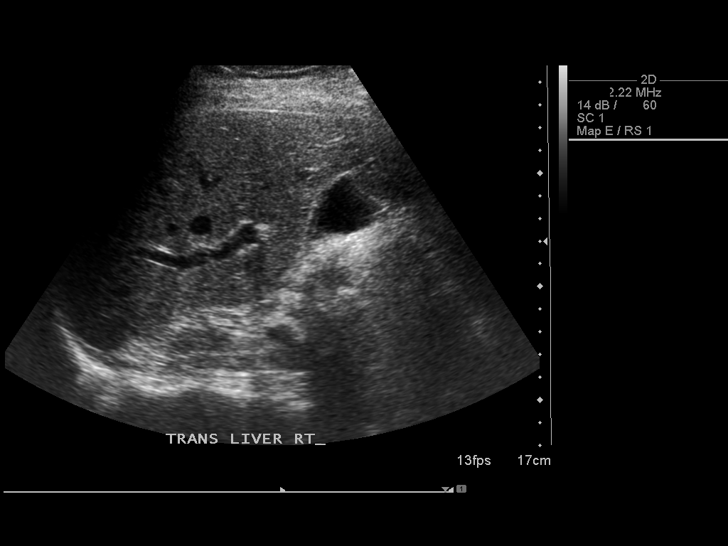
[im 25/46]
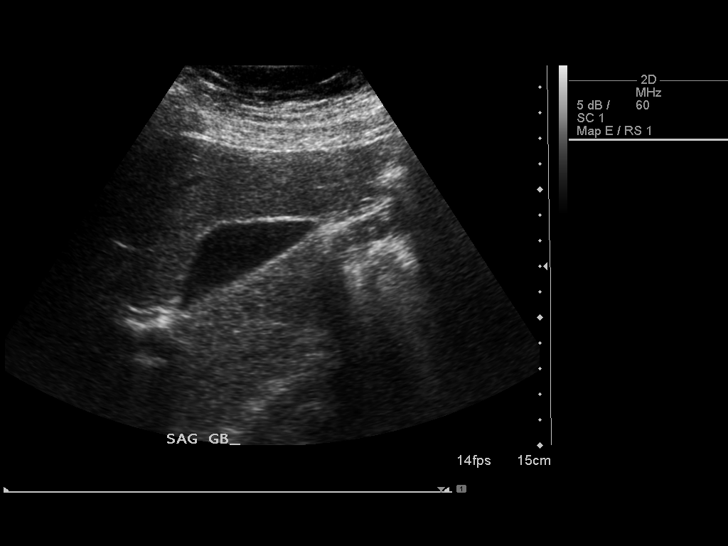
[im 29/46]
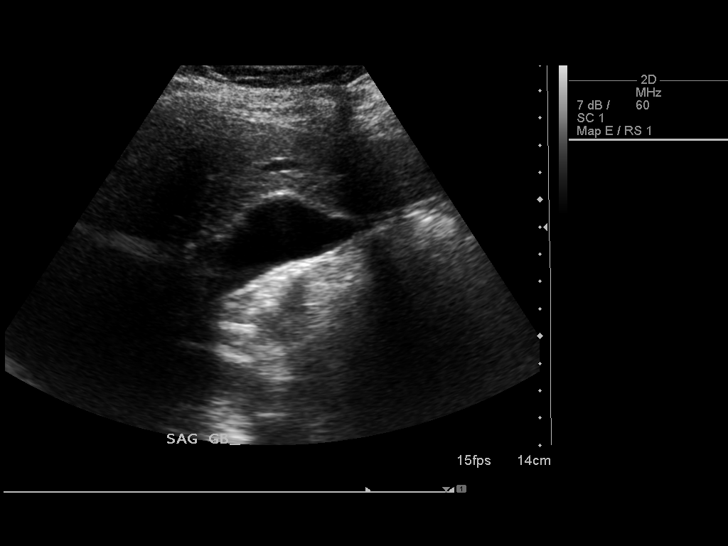
[im 31/46]
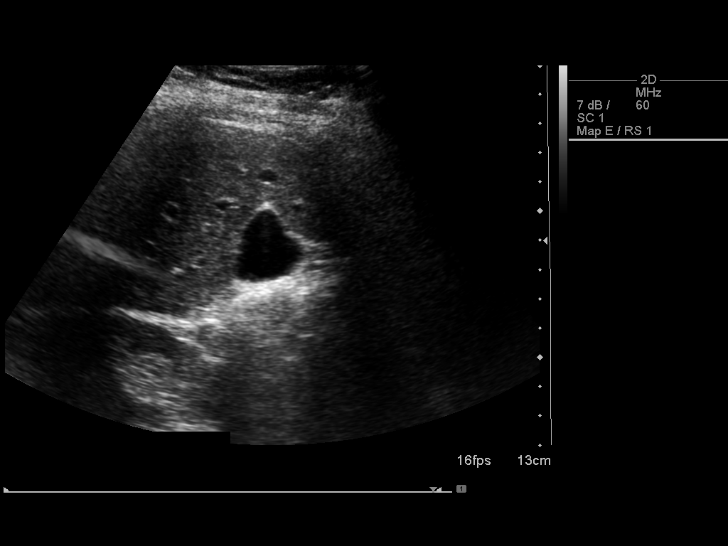
[im 34/46]
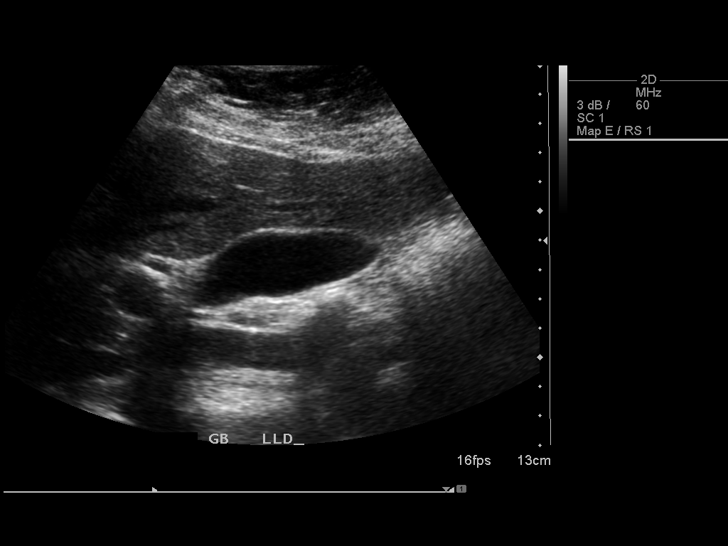
[im 38/46]
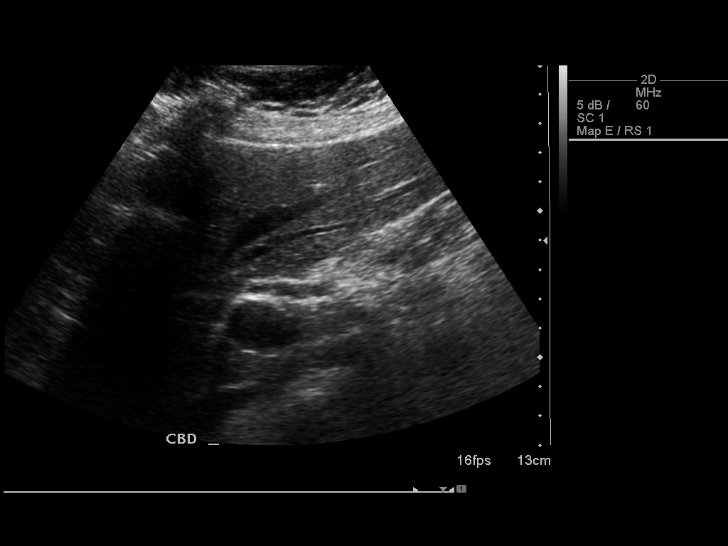
[im 42/46]
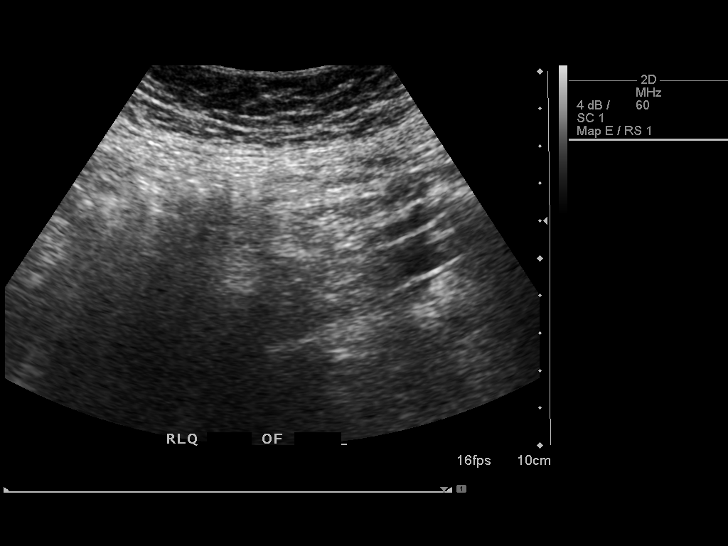
[im 46/46]
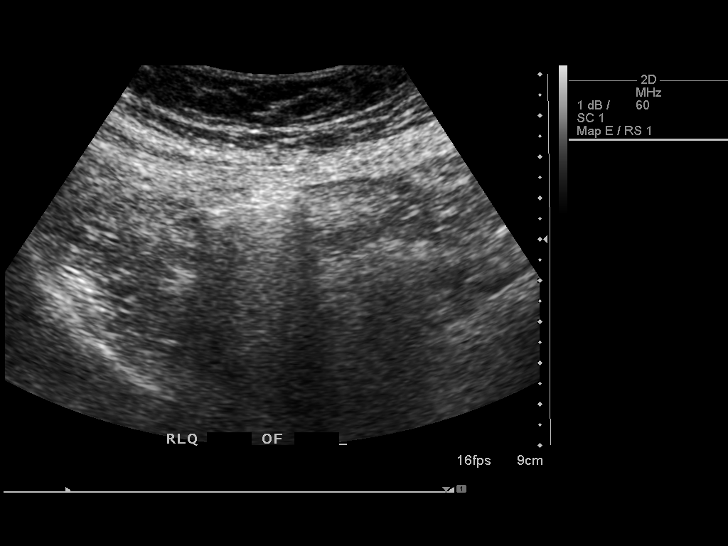

[14 of 25 positions shown; findings below may reference images not displayed]

FINDINGS: Gallbladder:

No gallstones or wall thickening visualized. No sonographic Murphy
sign noted.

Common bile duct:

Diameter: 0.6 cm

Liver:

No focal lesion identified. Within normal limits in parenchymal
echogenicity.

Scanning was directed to the region of concern in the right lower
quadrant. No abnormality is identified.
IMPRESSION: Negative exam.

## 2017-01-16 ENCOUNTER — Telehealth: Payer: Self-pay | Admitting: Cardiology

## 2017-01-16 ENCOUNTER — Ambulatory Visit
Admission: RE | Admit: 2017-01-16 | Discharge: 2017-01-16 | Disposition: A | Discharge status: Home / Self Care | Payer: Medicare Other | Source: Ambulatory Visit | Attending: Radiation Oncology | Admitting: Radiation Oncology

## 2017-01-16 DIAGNOSIS — C3491 Malignant neoplasm of unspecified part of right bronchus or lung: Secondary | ICD-10-CM

## 2017-01-16 MED ORDER — GADOBENATE DIMEGLUMINE 529 MG/ML IV SOLN
10.0000 mL | Freq: Once | INTRAVENOUS | Status: AC | PRN
  Administered 2017-01-16: 10 mL via INTRAVENOUS

## 2017-01-16 NOTE — Telephone Encounter (Signed)
Contacted patient regarding medication(s). Explained that carvedilol was d/c'ed on 6/19 and changed to metoprolol succinate. She states she must have gotten an automatic refill. She states she still has dizziness. She reports she stays hydrated but doesn't have the best diet/appetite. Explained that this could cause her symptoms as well. Some BP readings (no particular order) noted below - patient aware readings are normal. Routed to MD as FYI/further recommendations, if any.   115/71 105/66 112/69 120/80

## 2017-01-16 NOTE — Telephone Encounter (Signed)
Michele [name] is calling to find out why she received a refill for Coreg and Dr.Harding took her off of the Coreg and put her on Metropolol . Please call

## 2017-01-19 ENCOUNTER — Emergency Department (HOSPITAL_COMMUNITY): Payer: Medicare Other

## 2017-01-19 ENCOUNTER — Encounter (HOSPITAL_COMMUNITY): Payer: Self-pay | Admitting: Emergency Medicine

## 2017-01-19 ENCOUNTER — Emergency Department (HOSPITAL_COMMUNITY)
Admission: EM | Admit: 2017-01-19 | Discharge: 2017-01-19 | Disposition: A | Discharge status: Home / Self Care | Payer: Medicare Other | Attending: Emergency Medicine | Admitting: Emergency Medicine

## 2017-01-19 DIAGNOSIS — Z79899 Other long term (current) drug therapy: Secondary | ICD-10-CM | POA: Insufficient documentation

## 2017-01-19 DIAGNOSIS — R531 Weakness: Secondary | ICD-10-CM | POA: Diagnosis not present

## 2017-01-19 DIAGNOSIS — Z87891 Personal history of nicotine dependence: Secondary | ICD-10-CM | POA: Insufficient documentation

## 2017-01-19 DIAGNOSIS — D3500 Benign neoplasm of unspecified adrenal gland: Secondary | ICD-10-CM | POA: Diagnosis not present

## 2017-01-19 DIAGNOSIS — R634 Abnormal weight loss: Secondary | ICD-10-CM | POA: Diagnosis not present

## 2017-01-19 DIAGNOSIS — I2510 Atherosclerotic heart disease of native coronary artery without angina pectoris: Secondary | ICD-10-CM | POA: Diagnosis not present

## 2017-01-19 DIAGNOSIS — R05 Cough: Secondary | ICD-10-CM | POA: Diagnosis not present

## 2017-01-19 DIAGNOSIS — J45909 Unspecified asthma, uncomplicated: Secondary | ICD-10-CM | POA: Insufficient documentation

## 2017-01-19 DIAGNOSIS — I129 Hypertensive chronic kidney disease with stage 1 through stage 4 chronic kidney disease, or unspecified chronic kidney disease: Secondary | ICD-10-CM | POA: Diagnosis not present

## 2017-01-19 DIAGNOSIS — R51 Headache: Secondary | ICD-10-CM | POA: Insufficient documentation

## 2017-01-19 DIAGNOSIS — R1084 Generalized abdominal pain: Secondary | ICD-10-CM | POA: Diagnosis not present

## 2017-01-19 DIAGNOSIS — J449 Chronic obstructive pulmonary disease, unspecified: Secondary | ICD-10-CM | POA: Diagnosis not present

## 2017-01-19 DIAGNOSIS — R1010 Upper abdominal pain, unspecified: Secondary | ICD-10-CM | POA: Diagnosis present

## 2017-01-19 DIAGNOSIS — N189 Chronic kidney disease, unspecified: Secondary | ICD-10-CM | POA: Insufficient documentation

## 2017-01-19 LAB — COMPREHENSIVE METABOLIC PANEL
ALT: 13 U/L — ABNORMAL LOW (ref 14–54)
AST: 23 U/L (ref 15–41)
Albumin: 4.1 g/dL (ref 3.5–5.0)
Alkaline Phosphatase: 63 U/L (ref 38–126)
Anion gap: 6 (ref 5–15)
BUN: 11 mg/dL (ref 6–20)
CO2: 30 mmol/L (ref 22–32)
Calcium: 9.9 mg/dL (ref 8.9–10.3)
Chloride: 108 mmol/L (ref 101–111)
Creatinine, Ser: 0.54 mg/dL (ref 0.44–1.00)
GFR calc Af Amer: >60 mL/min (ref >60)
GFR calc non Af Amer: >60 mL/min (ref >60)
Glucose, Bld: 68 mg/dL (ref 65–99)
Potassium: 4.2 mmol/L (ref 3.5–5.1)
Sodium: 144 mmol/L (ref 135–145)
Total Bilirubin: 0.5 mg/dL (ref 0.3–1.2)
Total Protein: 6.4 g/dL — ABNORMAL LOW (ref 6.5–8.1)

## 2017-01-19 LAB — CBC WITH DIFFERENTIAL/PLATELET
Basophils Absolute: 0 10*3/uL (ref 0.0–0.1)
Basophils Relative: 0 %
Eosinophils Absolute: 0.1 10*3/uL (ref 0.0–0.7)
Eosinophils Relative: 1 %
HCT: 40 % (ref 36.0–46.0)
Hemoglobin: 13.6 g/dL (ref 12.0–15.0)
Lymphocytes Relative: 17 %
Lymphs Abs: 0.9 10*3/uL (ref 0.7–4.0)
MCH: 31.7 pg (ref 26.0–34.0)
MCHC: 34 g/dL (ref 30.0–36.0)
MCV: 93.2 fL (ref 78.0–100.0)
Monocytes Absolute: 0.3 10*3/uL (ref 0.1–1.0)
Monocytes Relative: 5 %
Neutro Abs: 3.9 10*3/uL (ref 1.7–7.7)
Neutrophils Relative %: 77 %
Platelets: 150 10*3/uL (ref 150–400)
RBC: 4.29 MIL/uL (ref 3.87–5.11)
RDW: 13.3 % (ref 11.5–15.5)
WBC: 5.1 10*3/uL (ref 4.0–10.5)

## 2017-01-19 LAB — LIPASE, BLOOD: Lipase: 29 U/L (ref 11–51)

## 2017-01-19 LAB — I-STAT TROPONIN, ED: Troponin i, poc: 0 ng/mL (ref 0.00–0.08)

## 2017-01-19 LAB — URINALYSIS, ROUTINE W REFLEX MICROSCOPIC
Bilirubin Urine: NEGATIVE
Glucose, UA: 50 mg/dL — AB
Hgb urine dipstick: NEGATIVE
Ketones, ur: NEGATIVE mg/dL
Leukocytes, UA: NEGATIVE
Nitrite: NEGATIVE
Protein, ur: NEGATIVE mg/dL
Specific Gravity, Urine: 1.003 — ABNORMAL LOW (ref 1.005–1.030)
pH: 7 (ref 5.0–8.0)

## 2017-01-19 MED ORDER — FAMOTIDINE IN NACL 20-0.9 MG/50ML-% IV SOLN
20.0000 mg | Freq: Once | INTRAVENOUS | Status: AC
  Administered 2017-01-19: 20 mg via INTRAVENOUS
  Filled 2017-01-19: qty 50

## 2017-01-19 MED ORDER — SODIUM CHLORIDE 0.9 % IV BOLUS (SEPSIS)
1000.0000 mL | Freq: Once | INTRAVENOUS | Status: AC
  Administered 2017-01-19: 1000 mL via INTRAVENOUS

## 2017-01-19 MED ORDER — IOPAMIDOL (ISOVUE-300) INJECTION 61%
INTRAVENOUS | Status: AC
  Administered 2017-01-19: 30 mL via ORAL
  Filled 2017-01-19: qty 30

## 2017-01-19 MED ORDER — IOPAMIDOL (ISOVUE-300) INJECTION 61%
30.0000 mL | Freq: Once | INTRAVENOUS | Status: AC | PRN
  Administered 2017-01-19: 30 mL via ORAL

## 2017-01-19 MED ORDER — MORPHINE SULFATE (PF) 2 MG/ML IV SOLN
2.0000 mg | Freq: Once | INTRAVENOUS | Status: AC
  Administered 2017-01-19: 2 mg via INTRAVENOUS
  Filled 2017-01-19: qty 1

## 2017-01-19 MED ORDER — IOPAMIDOL (ISOVUE-300) INJECTION 61%
INTRAVENOUS | Status: AC
  Administered 2017-01-19: 100 mL
  Filled 2017-01-19: qty 100

## 2017-01-19 MED ORDER — MORPHINE SULFATE (PF) 2 MG/ML IV SOLN
4.0000 mg | Freq: Once | INTRAVENOUS | Status: AC
  Administered 2017-01-19: 2 mg via INTRAVENOUS
  Filled 2017-01-19: qty 2

## 2017-01-19 NOTE — ED Notes (Signed)
Family at bedside. 

## 2017-01-19 NOTE — ED Notes (Signed)
IV team unsuccessful. MD made aware. [place] supplies placed at bedside for MD to attempt.

## 2017-01-19 NOTE — ED Triage Notes (Signed)
Per EMS, patient from home, c/o generalized body pain x3 months but progressively worsening x2 days. Patient also reports increased weight loss x"a few months." Reports seeing a dietician. Hx lung cancer. Not currently receiving treatments.

## 2017-01-19 NOTE — ED Notes (Signed)
IV team at bedside 

## 2017-01-19 NOTE — ED Notes (Signed)
Patient given turkey sandwich and diet ginger ale.

## 2017-01-19 NOTE — ED Notes (Signed)
IV team continues to attempt IV at bedside.

## 2017-01-19 NOTE — ED Notes (Signed)
Patient transported to X-ray 

## 2017-01-19 NOTE — ED Notes (Signed)
Matt RN attempting [place] IV.

## 2017-01-19 NOTE — ED Notes (Signed)
Patient transported to CT 
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
## 2017-01-19 NOTE — ED Notes (Signed)
ED Provider at bedside. 

## 2017-01-19 NOTE — ED Notes (Addendum)
Unsuccessful IV attempt x1. Patient requesting IV team and refusing further attempts until that time. IV team consult placed. MD made aware.

## 2017-01-19 NOTE — ED Notes (Signed)
Bed: RA07 Expected date: 01/19/17 Expected time: 11:27 AM Means of arrival: Ambulance Comments: Ca Pt Pain

## 2017-01-20 ENCOUNTER — Telehealth: Payer: Self-pay | Admitting: Physician Assistant

## 2017-01-20 ENCOUNTER — Telehealth: Payer: Self-pay | Admitting: Cardiology

## 2017-01-20 DIAGNOSIS — H25013 Cortical age-related cataract, bilateral: Secondary | ICD-10-CM | POA: Diagnosis not present

## 2017-01-20 DIAGNOSIS — H2513 Age-related nuclear cataract, bilateral: Secondary | ICD-10-CM | POA: Diagnosis not present

## 2017-01-20 DIAGNOSIS — H01003 Unspecified blepharitis right eye, unspecified eyelid: Secondary | ICD-10-CM | POA: Diagnosis not present

## 2017-01-20 DIAGNOSIS — H04123 Dry eye syndrome of bilateral lacrimal glands: Secondary | ICD-10-CM | POA: Diagnosis not present

## 2017-01-20 NOTE — Telephone Encounter (Signed)
New message    Pt is calling about her bp medication.   Pt c/o medication issue:  1. Name of Medication: metoprolol   2. How are you currently taking this medication (dosage and times per day)? 25 mg  3. Are you having a reaction (difficulty breathing--STAT)? No   4. What is your medication issue? Pt was in ER yesterday and they told her to call because her meds could be causing her to feel bad.

## 2017-01-20 NOTE — Telephone Encounter (Signed)
The pt received pantoprazole 20 mg from the pharmacy and has been taking 40 mg.  Pt was advised she can take 2  20 mg pantoprazole to equal 40 mg daily.  Pt agreed

## 2017-01-20 NOTE — Telephone Encounter (Signed)
Spoke with pt, she was recently in ER because for 2 weeks she has progressively getting weaker and her palpitations have gotten worse. They questioned the metoprolol because her bp and pulse were low in the ER by her report. She was on carvedilol before metoprolol and thinks she may have felt better but can not really remember. She would like to know about the metoprolol dose and her symptoms. Will forward for dr harding review and advise

## 2017-01-20 NOTE — Telephone Encounter (Signed)
I did not want Coreg.  BPs actually look ok.  Michele [name]

## 2017-01-21 ENCOUNTER — Encounter: Payer: Self-pay | Admitting: Radiation Oncology

## 2017-01-21 ENCOUNTER — Ambulatory Visit
Admission: RE | Admit: 2017-01-21 | Discharge: 2017-01-21 | Disposition: A | Discharge status: Home / Self Care | Payer: Medicare Other | Source: Ambulatory Visit | Attending: Radiation Oncology | Admitting: Radiation Oncology

## 2017-01-21 VITALS — BP 124/66 | HR 82 | Temp 97.9°F | Ht 65.5 in | Wt 117.2 lb

## 2017-01-21 DIAGNOSIS — Z923 Personal history of irradiation: Secondary | ICD-10-CM | POA: Diagnosis not present

## 2017-01-21 DIAGNOSIS — C3491 Malignant neoplasm of unspecified part of right bronchus or lung: Secondary | ICD-10-CM

## 2017-01-21 DIAGNOSIS — C771 Secondary and unspecified malignant neoplasm of intrathoracic lymph nodes: Secondary | ICD-10-CM | POA: Diagnosis not present

## 2017-01-21 NOTE — Progress Notes (Signed)
Michele [name] presents for follow up of radiation completed 05/06/16 to her Mediastinum and 07/08/16 to her Whole Brain. She reports generalized pain a 7/10 to her abdomen, back, sides, bones and muscles. She has shortness of breath with minimal activity. She has fatigue and is unable to completed some normal activities. She reports some short term memory loss. She is worried about her weight and why she is losing weight. She has seen our Dietician for assessment. She had an MRI brain on 01/16/17. She will see Dr. Earlie Server again 03/05/17 after a CT chest for results.   BP 124/66   Pulse 82   Temp 97.9 F (36.6 C)   Ht 5' 5.5" (1.664 m)   Wt 117 lb 3.2 oz (53.2 kg)   SpO2 100% Comment: room air  BMI 19.21 kg/m     Wt Readings from Last 3 Encounters:  01/21/17 117 lb 3.2 oz (53.2 kg)  12/26/16 115 lb 12.8 oz (52.5 kg)  12/12/16 116 lb 12.8 oz (53 kg)

## 2017-01-21 NOTE — Telephone Encounter (Signed)
PATIENT CONTACTED( MESSAGE LEFT) ON SEPARATE TELEPHONE CALL

## 2017-01-21 NOTE — Telephone Encounter (Signed)
Do not change medications.  Glenetta Hew, MD

## 2017-01-21 NOTE — Progress Notes (Signed)
Radiation Oncology         [phone] ________________________________  Name: Michele [name] MRN: [id number]  Date: 01/21/2017  DOB: 09-07-1955  Follow-Up Visit Note  Outpatient  CC: [name], MD  Curt Bears, MD  Diagnosis and Prior Radiotherapy:    ICD-10-CM   1. 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   Narrative:  The patient returns today for routine follow-up of radiation completed 07/08/16.   Her MRI   brain from 01/16/17 was reviewed at our tumor board this week and I have personally looked at the images. No evidence of metastatic disease in the brain. The pt reports generalized pain in her body as well as some short term memory loss. She reports fatigue and difficulty with some normal activities. She reports shortness of breath. Her most recent CT A/p on 01/19/17 in the ED was overall negative and was normal. Her CXR with emphysematous changes, but otherwise normal. Her TSH in June was 1.41. She has f/u scheduled w/ Dr Julien Nordmann in October, GI in two days, cardiology in one month. She has also been advised by Dr Julien Nordmann to reduce her sugar intake, however, her endocrinologists have been advising her to increase her sugar intake. She has been confused in this aspect.   Overall she has not been doing well. Her husband reports that initially she was improving, however, recently she has worsened to be more fatigued and with loss of appetite. She also states that her abdominal pain has still been ongoing, sometimes more severe then other times.  Her weight is stable compared to April, within one pound. She expressed worry today about losing weight.   ALLERGIES:  is allergic to ciprofloxacin; aspirin; crestor [rosuvastatin]; ibuprofen; wellbutrin [bupropion]; lipitor [atorvastatin]; and sulfonamide derivatives.  Meds: Current Outpatient Prescriptions  Medication Sig Dispense Refill  . albuterol (PROVENTIL HFA;VENTOLIN HFA) 108 (90 Base) MCG/ACT inhaler Inhale 1-2 puffs into the lungs every 6 (six) hours as needed for wheezing or shortness of breath.    . ALPRAZolam (XANAX) 1 MG tablet Take 1 mg by mouth 3 (three) times daily as needed for anxiety.    . Calcium Carbonate-Vitamin D (CALCIUM 600+D) 600-400 MG-UNIT tablet Take 1 tablet by mouth daily.    . clopidogrel (PLAVIX) 75 MG tablet TAKE 1 TABLET(75 MG) BY MOUTH DAILY 90 tablet 2  . Coenzyme Q10 (COQ10) 100 MG CAPS Take 100 mg by mouth daily.     [name] Ethyl (VASCEPA) 1 g CAPS Take 1 g by mouth daily.     . isosorbide mononitrate (IMDUR) 30 MG 24 hr tablet TAKE 1 TABLET(30 MG) BY MOUTH AT BEDTIME 90 tablet 3  . metoprolol succinate (TOPROL XL) 25 MG 24 hr tablet Take 1 tablet (25 mg total) by mouth every evening. (Patient taking differently: Take 25 mg by mouth at bedtime. ) 90 tablet 3  . nitroGLYCERIN (NITROSTAT) 0.4 MG SL tablet DISSOLVE 1 TABLET UNDER THE TONGUE EVERY 5 MINUTES AS NEEDED FOR CHEST PAIN 25 tablet 4  . pantoprazole (PROTONIX) 40 MG  tablet Take 1 tablet (40 mg total) by mouth 2 (two) times daily before a meal. (Patient taking differently: Take 40 mg by mouth daily. ) 60 tablet 6  . Polyethyl Glycol-Propyl Glycol (SYSTANE) 0.4-0.3 % SOLN Place 2 drops into both eyes 4 (four) times daily.    . polyethylene glycol (MIRALAX / GLYCOLAX) packet Take 17 g by mouth daily. (Patient taking differently: Take 17 g by mouth daily as needed for mild constipation. ) 14 each 0  . pravastatin (PRAVACHOL) 40 MG tablet TAKE 1 TABLET(40 MG) BY MOUTH EVERY EVENING (Patient taking  differently: Take 1 tablet by mouth at bedtime) 90 tablet 2  . senna-docusate (SENOKOT-S) 8.6-50 MG tablet Take 1 tablet by mouth 2 (two) times daily as needed for mild constipation.    [name] tiotropium (SPIRIVA) 18 MCG inhalation capsule Place 18 mcg into inhaler and inhale daily.    . fluorometholone (FML) 0.1 % ophthalmic suspension SHAKE LQ AND INT 1 GTT IN OU TID  0   No current facility-administered medications for this encounter.    Facility-Administered Medications Ordered in Other Encounters  Medication Dose Route Frequency Provider Last Rate Last Dose  . HYDROcodone-acetaminophen (NORCO/VICODIN) 5-325 MG per tablet 1 tablet  1 tablet Oral Once Susanne Borders, NP        Physical Findings:    height is 5' 5.5" (1.664 m) and weight is 117 lb 3.2 oz (53.2 kg). Her temperature is 97.9 F (36.6 C). Her blood pressure is 124/66 and her pulse is 82. Her oxygen saturation is 100%.  General: Alert and oriented, in no acute distress HEENT: Head is normocephalic. Extraocular movements are intact. Oropharynx and mucous membranes are clear. Neck: Neck is supple, no palpable cervical or supraclavicular lymphadenopathy. No LAD appreciated in the neck or supraclavicular region.  Heart: Regular in rate and rhythm with no murmurs, rubs, or gallops. Chest: Clear to auscultation bilaterally, with no rhonchi, wheezes, or rales. Extremities: No cyanosis or edema. Lymphatics: see Neck Exam Musculoskeletal: symmetric strength and muscle tone throughout. Neurologic: Cranial nerves II through XII are grossly intact. No obvious focalities. Speech is fluent. Coordination is intact. Strength is symmetric. Finger to nose testing is normal bilaterally.  Psychiatric: Judgment and insight are intact. Depressed affect.   Lab Findings: Lab Results  Component Value Date   WBC 5.1 01/19/2017   HGB 13.6 01/19/2017   HCT 40.0 01/19/2017   MCV 93.2 01/19/2017   PLT 150 01/19/2017    Lab Results  Component Value  Date   TSH 1.41 11/18/2016    CMP     Component Value Date/Time   NA 144 01/19/2017 1430   NA 144 12/05/2016 0839   NA 143 11/26/2016 1014   K 4.2 01/19/2017 1430   K 4.3 11/26/2016 1014   CL 108 01/19/2017 1430   CO2 30 01/19/2017 1430   CO2 28 11/26/2016 1014   GLUCOSE 68 01/19/2017 1430   GLUCOSE 94 11/26/2016 1014   BUN 11 01/19/2017 1430   BUN 16 12/05/2016 0839   BUN 14.0 11/26/2016 1014   CREATININE 0.54 01/19/2017 1430   CREATININE 0.7 11/26/2016 1014   CALCIUM 9.9 01/19/2017 1430   CALCIUM 10.1 11/26/2016 1014   PROT 6.4 (L) 01/19/2017 1430   PROT 6.0 12/05/2016 0839   PROT 6.5 11/26/2016 1014   ALBUMIN 4.1 01/19/2017 1430   ALBUMIN 4.3 12/05/2016 0839   ALBUMIN 3.9 11/26/2016 1014   AST 23 01/19/2017 1430   AST 19 11/26/2016 1014  ALT 13 (L) 01/19/2017 1430   ALT 10 11/26/2016 1014   ALKPHOS 63 01/19/2017 1430   ALKPHOS 71 11/26/2016 1014   BILITOT 0.5 01/19/2017 1430   BILITOT 0.4 12/05/2016 0839   BILITOT 0.34 11/26/2016 1014   GFRNONAA >60 01/19/2017 1430   GFRAA >60 01/19/2017 1430    Radiographic Findings: Dg Chest 2 View  Result Date: 01/19/2017 CLINICAL DATA:  Eighteen bowel over for 2 weeks. Cough and weakness. EXAM: CHEST  2 VIEW COMPARISON:  12/12/2016 FINDINGS: Bullous emphysema. There is no edema, consolidation, effusion, or pneumothorax. No cardiomegaly. Coronary stent noted. No acute osseous finding. IMPRESSION: Emphysema without acute superimposed finding. Electronically Signed   By: Monte Fantasia M.D.   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 4 month follow-up stereotactic radiosurgery. Creatinine was obtained on site at [facility] at [address] Results: Creatinine 0.6 mg/dL. EXAM: MRI HEAD WITHOUT AND WITH CONTRAST TECHNIQUE: Multiplanar, multiecho pulse sequences of the brain and surrounding structures were  obtained without and with intravenous contrast. CONTRAST:  45mL MULTIHANCE GADOBENATE DIMEGLUMINE 529 MG/ML IV SOLN COMPARISON:  Brain MRI 09/20/2016 FINDINGS: Brain: The midline structures are normal. There is no focal diffusion restriction to indicate acute infarct. There is minimal scattered hyperintense T2-weighted signal within the periventricular white matter, most often seen in the setting of chronic microvascular ischemia. No intraparenchymal hematoma or chronic microhemorrhage. Brain volume is normal for age without age-advanced or lobar predominant atrophy. The dura is normal and there is no extra-axial collection. No contrast-enhancing lesions. Vascular: Major intracranial arterial and venous sinus flow voids are preserved. Skull and upper cervical spine: The visualized skull base, calvarium, upper cervical spine and extracranial soft tissues are normal. Sinuses/Orbits: No fluid levels or advanced mucosal thickening. Small amount of left mastoid fluid. Normal orbits. IMPRESSION: No intracranial or calvarial metastatic disease. Electronically Signed   By: Ulyses Jarred M.D.   On: 01/16/2017 14:42   Ct Abdomen Pelvis W Contrast  Result Date: 01/19/2017 CLINICAL DATA:  Generalized body pain for 3 months, worsening over the last 2 days. Increasing weight loss for a few months. History of lung cancer. EXAM: CT ABDOMEN AND PELVIS WITH CONTRAST TECHNIQUE: Multidetector CT imaging of the abdomen and pelvis was performed using the standard protocol following bolus administration of intravenous contrast. CONTRAST:  78mL ISOVUE-300 IOPAMIDOL (ISOVUE-300) INJECTION 61%, 143mL ISOVUE-300 IOPAMIDOL (ISOVUE-300) INJECTION 61% COMPARISON:  Oct 14, 2016 FINDINGS: Lower chest: Emphysematous changes are seen in the lung bases. Atelectasis is seen dependently. There is a small hiatal hernia. The lung bases are otherwise normal. Hepatobiliary: Hepatic steatosis. Otherwise, the liver, gallbladder, and portal vein are  normal. Pancreas: Unremarkable. No pancreatic ductal dilatation or surrounding inflammatory changes. Spleen: Normal in size without focal abnormality. Adrenals/Urinary Tract: Previously described tiny adrenal adenomas are unchanged. No new adrenal abnormalities. A right renal cyst is identified. No suspicious masses in either kidney. No hydronephrosis or perinephric stranding. No stones along the course of either ureter. The bladder is normal. Stomach/Bowel: The stomach and small bowel are normal. The colon is normal. The appendix is not visualized but there is no secondary evidence of appendicitis. Vascular/Lymphatic: Atherosclerotic change is seen in the non aneurysmal aorta, iliac vessels, and femoral vessels. No adenopathy. Reproductive: Uterus and bilateral adnexa are unremarkable. Other: No free air or free fluid.  No significant hernia. Musculoskeletal: No acute or significant osseous  findings. IMPRESSION: 1. No acute abnormality identified. 2. Emphysematous changes in the lung bases. 3. Atherosclerotic changes in the aorta and iliac vessels. 4. Small adrenal adenomas, unchanged. 5. No other acute abnormalities. Aortic Atherosclerosis (ICD10-I70.0) and Emphysema (ICD10-J43.9). Electronically Signed   By: Dorise Bullion III M.D   On: 01/19/2017 15:56    Impression/Plan: 61 year old woman with limited stage IIIA T0N2M0 small cell lung cancer with secondary malignancy of mediastinal lymph nodes. NED in brain per most recent brain MRI on 01/16/17.    I advised the patient to continue to maintain her food and fluid intake as her weight has remained stable since her last visit. She has been seen by a dietician recently and I agree with their recommendations and urged her to continue eating as she can tolerate to maintain this.  She declines another dietician visit.  Her husband voiced concern over Dr Julien Nordmann telling them that her cancer is "not active". I took time to explain that her cancer on repeat CT  Chest is not growing and the next imaging results will give more information. I cannot say yet, with certainty, that she is completely cancer-free, but the areas of concern have remained controlled and possibly scarred down without residual disease since treatment.   She also expressed concerns over her fatigue and generalized weakness. I offered to refer her into nutrition and physical therapy for this. I also advised her to become more active in her daily life, such as going on walks or joining cancer support groups within her community.   I also counseled her that I am concerned that she may be in a depressive state. She has previously been offered antidepressive treatment by her Cardiologist, and I reaffirmed this with her today. I ultimately urged her to follow-up with her PCP for ongoing management for this issue so that she can seek treatment to elevate her mood.   I recommended that she receive a repeat brain MRI in 36mo and f/u in Radiation Oncology following this. She is agreeable with this.  I spent 30 minutes face to face with the patient and more than 50% of that time was spent in counseling and/or coordination of care. _____________________________________   [name], MD  This document serves as a record of services personally performed by [name], MD. It was created on his behalf by Reola Mosher, a trained medical scribe. The creation of this record is based on the scribe's personal observations and the provider's statements to them. This document has been checked and approved by the attending provider.

## 2017-01-21 NOTE — Telephone Encounter (Signed)
Left message for patient of dr hardings recommendations.

## 2017-01-23 ENCOUNTER — Ambulatory Visit (INDEPENDENT_AMBULATORY_CARE_PROVIDER_SITE_OTHER): Payer: Medicare Other | Admitting: Nurse Practitioner

## 2017-01-23 ENCOUNTER — Telehealth: Payer: Self-pay | Admitting: Cardiology

## 2017-01-23 ENCOUNTER — Other Ambulatory Visit: Payer: Self-pay | Admitting: Cardiology

## 2017-01-23 ENCOUNTER — Encounter: Payer: Self-pay | Admitting: Nurse Practitioner

## 2017-01-23 VITALS — BP 100/54 | HR 80 | Ht 65.5 in | Wt 116.0 lb

## 2017-01-23 DIAGNOSIS — R109 Unspecified abdominal pain: Secondary | ICD-10-CM | POA: Diagnosis not present

## 2017-01-23 DIAGNOSIS — F419 Anxiety disorder, unspecified: Secondary | ICD-10-CM

## 2017-01-23 NOTE — Telephone Encounter (Signed)
Pt c/o BP issue: STAT if pt c/o blurred vision, one-sided weakness or slurred speech  1. What are your last 5 BP readings? 100/54 at 9am today  2. Are you having any other symptoms (ex. Dizziness, headache, blurred vision, passed out)? Faint, really weak   3. What is your BP issue? Low  Patient calling back, states that she was told that her Dr. [name] wouldn't change medications but patient is still feeling faint and weak, with low BP

## 2017-01-23 NOTE — Patient Instructions (Signed)
If you are age 63 or older, your body mass index should be between 23-30. Your Body mass index is 19.01 kg/m. If this is out of the aforementioned range listed, please consider follow up with your Primary Care Provider.  If you are age 27 or younger, your body mass index should be between 19-25. Your Body mass index is 19.01 kg/m. If this is out of the aformentioned range listed, please consider follow up with your Primary Care Provider.   Follow up as needed.  Thank you for choosing me and [facility].   Tye Savoy, NP
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
## 2017-01-23 NOTE — Telephone Encounter (Signed)
Returned the call to the patient. She stated that at her GI appointment today her blood pressure was 100/54 and heart rate was in the 80's. She took her blood pressure when she got home and it was 1124/73 and her heart rate was 80. She is doubtful that her blood pressure machine is accurate so does not take it regularly.  She also stated that at her ED visit they stated that she has orthostatic hypotension, even though it was not mentioned in the notes. She would like to make Dr. [name] aware to see if he has any further recommendations. She is concerned that her Metoprolol dose may be too high.

## 2017-01-23 NOTE — Telephone Encounter (Signed)
I am quite sure that she has orthostatic hypotension because she has baseline hypotension which is why we backed off on her medications. That is why I switched her from carvedilol to metoprolol. It is not too high. As long as her blood pressures are over 100 she is fine.  Please tell her to stop worrying about her blood pressure medications. I spent 45 minutes discussing this with her in the clinic.  Glenetta Hew, MD

## 2017-01-23 NOTE — Telephone Encounter (Signed)
Michele [name] called and made aware of Dr. Allison Quarry recommendation. She has been advised to rise slowly from standing and to take her time in her daily activities. She verbalized her understanding.

## 2017-01-24 ENCOUNTER — Telehealth: Payer: Self-pay | Admitting: Cardiology

## 2017-01-24 DIAGNOSIS — R52 Pain, unspecified: Secondary | ICD-10-CM | POA: Diagnosis not present

## 2017-01-24 DIAGNOSIS — I959 Hypotension, unspecified: Secondary | ICD-10-CM | POA: Diagnosis not present

## 2017-01-24 NOTE — Telephone Encounter (Signed)
Pt notified of Dr [name] message she will get a new BP cuff to access daily before her metoprolol. She will call back when needed.

## 2017-01-24 NOTE — Telephone Encounter (Signed)
S/w pt she states that she is hydrated, she states that she drinks 4-5 16oz bottles of water, 6oz apple juice, 6oz of grape juice and sometimes boost but not today. She states that she took metoprolol 25mg  last night. States that today at her GI MD appt her BP 90/58 HR 76. She reports having a headache, dizziness and the feeling that she is going to pass out and this gets worse when she is walking.BP yesterday was 100/54. Pt want sooner appt to discuss BP/mediations. Please advise

## 2017-01-24 NOTE — Telephone Encounter (Signed)
Per Dr [name]: pt may hold metoprolol if BP <100 for that day and take BP the next day and re-acess daily. for dizziness lie down and rest.

## 2017-01-24 NOTE — Telephone Encounter (Signed)
Pt called complaining of palpitations. She is afraid to take her Toprol 25 mg because "she hasn't eaten yet" and her B/P was running IOE-703 systolic earlier in the day, 500 systolic now. I encouraged her to take her Toprol. If she does not improve she should go to the ED for further evaluation.

## 2017-01-24 NOTE — Telephone Encounter (Signed)
Pt called complaining of palpitations. She is afraid to take her Toprol 25 mg because "she hasn't eaten yet" and her B/P was running CHE-527 systolic earlier in the day, 782 systolic now. I encouraged her to take her Toprol. If she does not improve she should go to the ED for further evaluation.   Kerin Ransom PA-C 01/24/2017 5:46 PM

## 2017-01-24 NOTE — Telephone Encounter (Signed)
New message    Pt is calling about her BP.  Pt c/o BP issue: STAT if pt c/o blurred vision, one-sided weakness or slurred speech  1. What are your last 5 BP readings? 90/58  2. Are you having any other symptoms (ex. Dizziness, headache, blurred vision, passed out)? Headache, weak, pt states she feels like she is going to pass out  3. What is your BP issue? Pt is calling about her low BP. She has an appt but wants sooner. Offered Wednesday with Dr. [name] at [facility]. Pt states she doesn't have a ride after that time. Please call.

## 2017-01-31 ENCOUNTER — Telehealth: Payer: Self-pay

## 2017-01-31 NOTE — Telephone Encounter (Signed)
Pt called asking if she should get her flu shot early.

## 2017-02-02 NOTE — Telephone Encounter (Signed)
Yes. She can get her flu shot when it becomes available.

## 2017-02-04 NOTE — Telephone Encounter (Signed)
lvm per Dr Worthy Flank reply.

## 2017-02-06 ENCOUNTER — Telehealth: Payer: Self-pay | Admitting: Medical Oncology

## 2017-02-06 NOTE — Telephone Encounter (Signed)
Clarified PET appt.

## 2017-02-08 DIAGNOSIS — Z23 Encounter for immunization: Secondary | ICD-10-CM | POA: Diagnosis not present

## 2017-02-09 ENCOUNTER — Other Ambulatory Visit: Payer: Self-pay | Admitting: Cardiology

## 2017-02-09 DIAGNOSIS — E785 Hyperlipidemia, unspecified: Secondary | ICD-10-CM

## 2017-02-09 DIAGNOSIS — Z79899 Other long term (current) drug therapy: Secondary | ICD-10-CM

## 2017-02-10 ENCOUNTER — Telehealth: Payer: Self-pay | Admitting: Medical Oncology

## 2017-02-10 NOTE — Telephone Encounter (Signed)
Returned call re flu vaccine. She received flu vaccine on Friday.

## 2017-02-13 ENCOUNTER — Telehealth: Payer: Self-pay | Admitting: Pulmonary Disease

## 2017-02-13 MED ORDER — DOXYCYCLINE HYCLATE 100 MG PO TABS: ORAL_TABLET | ORAL | 0 refills | Status: DC
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
## 2017-02-13 NOTE — Telephone Encounter (Signed)
Called and spoke with pt. Pt feels that she may be developing a cold. Pt reports of prod cough with thick white mucus, wheezing, increased weakness & head/chest congestion x2wk Taken mucinex with mild improvement.   CY please advise, as VS is unavailable. Thanks.   Current Outpatient Prescriptions on File Prior to Visit  Medication Sig Dispense Refill  . albuterol (PROVENTIL HFA;VENTOLIN HFA) 108 (90 Base) MCG/ACT inhaler Inhale 1-2 puffs into the lungs every 6 (six) hours as needed for wheezing or shortness of breath.    . ALPRAZolam (XANAX) 1 MG tablet Take 1 mg by mouth 3 (three) times daily as needed for anxiety.    . Calcium Carbonate-Vitamin D (CALCIUM 600+D) 600-400 MG-UNIT tablet Take 1 tablet by mouth daily.    . clopidogrel (PLAVIX) 75 MG tablet TAKE 1 TABLET(75 MG) BY MOUTH DAILY 90 tablet 2  . Coenzyme Q10 (COQ10) 100 MG CAPS Take 100 mg by mouth daily.     [name] Ethyl (VASCEPA) 1 g CAPS Take 1 g by mouth daily.     . isosorbide mononitrate (IMDUR) 30 MG 24 hr tablet TAKE 1 TABLET(30 MG) BY MOUTH AT BEDTIME 90 tablet 3  . metoprolol succinate (TOPROL XL) 25 MG 24 hr tablet Take 1 tablet (25 mg total) by mouth every evening. (Patient taking differently: Take 25 mg by mouth at bedtime. ) 90 tablet 3  . neomycin-polymyxin b-dexamethasone (MAXITROL) 3.5-10000-0.1 SUSP SHAKE LQ AND INT 1 GTT IN OU TID  0  . nitroGLYCERIN (NITROSTAT) 0.4 MG SL tablet DISSOLVE 1 TABLET UNDER THE TONGUE EVERY 5 MINUTES AS NEEDED FOR CHEST PAIN 25 tablet 4  . pantoprazole (PROTONIX) 40 MG tablet Take 1 tablet (40 mg total) by mouth 2 (two) times daily before a meal. (Patient taking differently: Take 40 mg by mouth daily. ) 60 tablet 6  . pantoprazole (PROTONIX) 40 MG tablet TAKE 1 TABLET(40 MG) BY MOUTH DAILY 30 tablet 0  . Polyethyl Glycol-Propyl Glycol (SYSTANE) 0.4-0.3 % SOLN Place 2 drops into both eyes 4 (four) times daily. When not using prescription drops.    . polyethylene glycol (MIRALAX /  GLYCOLAX) packet Take 17 g by mouth daily. (Patient taking differently: Take 17 g by mouth daily as needed for mild constipation. ) 14 each 0  . pravastatin (PRAVACHOL) 40 MG tablet TAKE 1 TABLET(40 MG) BY MOUTH EVERY EVENING 90 tablet 3  . senna-docusate (SENOKOT-S) 8.6-50 MG tablet Take 1 tablet by mouth 2 (two) times daily as needed for mild constipation.    [name] tiotropium (SPIRIVA) 18 MCG inhalation capsule Place 18 mcg into inhaler and inhale daily.     Current Facility-Administered Medications on File Prior to Visit  Medication Dose Route Frequency Provider Last Rate Last Dose  . HYDROcodone-acetaminophen (NORCO/VICODIN) 5-325 MG per tablet 1 tablet  1 tablet Oral Once Susanne Borders, NP        Allergies  Allergen Reactions  . Ciprofloxacin Other (See Comments)    Reaction:  Hypoglycemia   . Aspirin Other (See Comments)    Reaction:  GI upset   Pt is only able to take enteric coated Aspirin.    . Crestor [Rosuvastatin] Other (See Comments)    Reaction:  Muscle pain   . Ibuprofen Other (See Comments)    Reaction:  GI upset   . Wellbutrin [Bupropion] Palpitations  . Lipitor [Atorvastatin] Other (See Comments)    Reaction:  Muscle pain   . Sulfonamide Derivatives Itching and Rash

## 2017-02-13 NOTE — Telephone Encounter (Signed)
Pt returned call.  [phone]

## 2017-02-13 NOTE — Telephone Encounter (Signed)
Pt is concerned about taken abx with her hx of lung CA. Pt is also concerned about possible PNA or URI.  CY please advise. Thanks.

## 2017-02-13 NOTE — Telephone Encounter (Signed)
Per CY verbally- Doxycycline will not interfere with lung ca. lmtcb x1 for Rx has been sent to preferred pharmacy.

## 2017-02-13 NOTE — Telephone Encounter (Signed)
lmtcb x1 for pt. 

## 2017-02-17 NOTE — Telephone Encounter (Signed)
Spoke with pt. She is aware of CY's response. Nothing further was needed. 

## 2017-02-19 ENCOUNTER — Telehealth: Payer: Self-pay | Admitting: Pulmonary Disease

## 2017-02-19 NOTE — Telephone Encounter (Signed)
Pt is taking last day of abx called on 9/13, s/s not at all improved- c/o prod cough with thick yellow mucus, worsening weakness.  Pt scheduled to see JN tomorrow morning for further evaluation.  Nothing further needed at this time.

## 2017-02-20 ENCOUNTER — Ambulatory Visit (INDEPENDENT_AMBULATORY_CARE_PROVIDER_SITE_OTHER): Payer: Medicare Other | Admitting: Pulmonary Disease

## 2017-02-20 ENCOUNTER — Ambulatory Visit (INDEPENDENT_AMBULATORY_CARE_PROVIDER_SITE_OTHER)
Admission: RE | Admit: 2017-02-20 | Discharge: 2017-02-20 | Disposition: A | Discharge status: Home / Self Care | Payer: Medicare Other | Source: Ambulatory Visit | Attending: Pulmonary Disease | Admitting: Pulmonary Disease

## 2017-02-20 ENCOUNTER — Telehealth: Payer: Self-pay | Admitting: Pulmonary Disease

## 2017-02-20 ENCOUNTER — Encounter: Payer: Self-pay | Admitting: Pulmonary Disease

## 2017-02-20 VITALS — BP 136/78 | HR 80 | Ht 65.5 in | Wt 115.0 lb

## 2017-02-20 DIAGNOSIS — R059 Cough, unspecified: Secondary | ICD-10-CM

## 2017-02-20 DIAGNOSIS — R05 Cough: Secondary | ICD-10-CM | POA: Diagnosis not present

## 2017-02-20 DIAGNOSIS — I208 Other forms of angina pectoris: Secondary | ICD-10-CM

## 2017-02-20 DIAGNOSIS — J449 Chronic obstructive pulmonary disease, unspecified: Secondary | ICD-10-CM | POA: Diagnosis not present

## 2017-02-20 DIAGNOSIS — C3490 Malignant neoplasm of unspecified part of unspecified bronchus or lung: Secondary | ICD-10-CM

## 2017-02-20 MED ORDER — LEVALBUTEROL TARTRATE 45 MCG/ACT IN AERO: 1.0000 | INHALATION_SPRAY | Freq: Four times a day (QID) | RESPIRATORY_TRACT | 3 refills | Status: DC | PRN

## 2017-02-20 MED ORDER — PREDNISONE 20 MG PO TABS: 20.0000 mg | ORAL_TABLET | Freq: Every day | ORAL | 0 refills | Status: DC

## 2017-02-20 MED ORDER — HYDROCOD POLST-CPM POLST ER 10-8 MG/5ML PO SUER: 5.0000 mL | Freq: Every evening | ORAL | 0 refills | Status: DC | PRN

## 2017-02-20 NOTE — Telephone Encounter (Signed)
Called and spoke with the pt and notified of recs per JN  She verbalized understanding  Called [facility] to get PA info for xopenex HFA- awaiting fax   In the meantime, she states that she is afraid to take the prednisone that we gave her b/c she already has a low immune system and read that prednisone is dangerous for her to take. I advised that JN prescribed this b/c the benefits of taking it would outweigh the risks. She still does not want to take. I advised unsure if he would be able to give an alternative for this. Please advise thanks

## 2017-02-20 NOTE — Telephone Encounter (Signed)
She is already on Ventolin but has palpitations with it. Can we do a prior authorization or formulary exception for the Xopenex? She can take Claritin, Zyrtec, or Allegra with a decongestant to see if that helps. Thanks.
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
## 2017-02-20 NOTE — Patient Instructions (Signed)
   We are switching you over from your Ventolin inhaler to Xopenex. Use this 3 times daily while you are ill.  Keep using your Spiriva inhaler.   Do not take your Xanax with the Tussionex cough syrup we are giving you today.  Call our office I fyou have any new breathing problems or feel like you're not getting better.  We will see you back next week.  TESTS ORDERED: 1. CXR PA/LAT TODAY 2. Sputum Culture AFB, Fungus, & Bacteria

## 2017-02-20 NOTE — Telephone Encounter (Signed)
Spoke with pt, advised message from [place]. Pt understood and nothing further is needed.

## 2017-02-20 NOTE — Telephone Encounter (Signed)
Called and spoke with pt. Pt states Tussionex and Xopenex is not covered by insurance.  Pt also wanted to know if it would be okay to take Claritin for nasal congestion.  Medicare does not cover cough suppressants. Pt states tessalone pearls did not work for her previously.    JN please advise if you would like to change Xopenex to an preferred medication - we have medicaid preferred drug list.

## 2017-02-20 NOTE — Telephone Encounter (Signed)
There is no alternative. We discussed this during her visit. The risk of not being able to breathe FAR outweighs the risk of it causing a minimal/temporary immune suppression which is controversial. J.

## 2017-02-21 ENCOUNTER — Other Ambulatory Visit: Payer: Medicare Other

## 2017-02-21 ENCOUNTER — Telehealth: Payer: Self-pay | Admitting: Pulmonary Disease

## 2017-02-21 ENCOUNTER — Ambulatory Visit (INDEPENDENT_AMBULATORY_CARE_PROVIDER_SITE_OTHER): Payer: Medicare Other | Admitting: Cardiology

## 2017-02-21 ENCOUNTER — Encounter: Payer: Self-pay | Admitting: Cardiology

## 2017-02-21 VITALS — BP 120/68 | HR 76 | Ht 65.5 in | Wt 117.6 lb

## 2017-02-21 DIAGNOSIS — I10 Essential (primary) hypertension: Secondary | ICD-10-CM

## 2017-02-21 DIAGNOSIS — I208 Other forms of angina pectoris: Secondary | ICD-10-CM | POA: Diagnosis not present

## 2017-02-21 DIAGNOSIS — Z9861 Coronary angioplasty status: Secondary | ICD-10-CM

## 2017-02-21 DIAGNOSIS — E785 Hyperlipidemia, unspecified: Secondary | ICD-10-CM | POA: Diagnosis not present

## 2017-02-21 DIAGNOSIS — I201 Angina pectoris with documented spasm: Secondary | ICD-10-CM | POA: Diagnosis not present

## 2017-02-21 DIAGNOSIS — R059 Cough, unspecified: Secondary | ICD-10-CM

## 2017-02-21 DIAGNOSIS — R5381 Other malaise: Secondary | ICD-10-CM | POA: Diagnosis not present

## 2017-02-21 DIAGNOSIS — R5382 Chronic fatigue, unspecified: Secondary | ICD-10-CM | POA: Diagnosis not present

## 2017-02-21 DIAGNOSIS — I2510 Atherosclerotic heart disease of native coronary artery without angina pectoris: Secondary | ICD-10-CM | POA: Diagnosis not present

## 2017-02-21 DIAGNOSIS — I209 Angina pectoris, unspecified: Secondary | ICD-10-CM | POA: Diagnosis not present

## 2017-02-21 DIAGNOSIS — R05 Cough: Secondary | ICD-10-CM
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
## 2017-02-21 NOTE — Telephone Encounter (Signed)
See CXR results note, Corrine spoke with pt. Nothing further needed.

## 2017-02-21 NOTE — Telephone Encounter (Signed)
Called and spoke to pt. Pt had OV with JN on 02/20/17. Pt requesting what she can take to help with her cough and PND during the day as she is taking tussionex at night. Advised pt she can take Delsym for her cough and Claritin for her PND and informed her to stay hydrated. Advised pt to call back if her s/s do not improve or worsen. Pt verbalized understanding and denied any further questions or concerns at this time.   Will forward to [facility] as FYI.

## 2017-02-21 NOTE — Patient Instructions (Addendum)
Talk with primary Doctor  - about treatment for depression.    No change with current medications.    Your physician wants you to follow-up in 6 month with Dr [name]. You will receive a reminder letter in the mail two months in advance. If you don't receive a letter, please call our office to schedule the follow-up appointment.

## 2017-02-22 LAB — RESPIRATORY CULTURE OR RESPIRATORY AND SPUTUM CULTURE: MICRO NUMBER:: 81047522

## 2017-02-23 ENCOUNTER — Encounter: Payer: Self-pay | Admitting: Cardiology

## 2017-02-23 NOTE — Assessment & Plan Note (Signed)
After multiple interventions,The most recent catheterization lead to Medical management of an essentially included distal Circumflex system.  The RCA stent remains patent.Her last mile view however was negative in September 2017, indicating that the Circumflex distribution is getting collateral flow from the right.  She has chronic chest pain, but We can never be sure when she's actually having angina.Practice point I would prefer to be treating you to treat her medically and hopefully try to treat her Depression/dysthymia.   Again, I spent about 15 minutes talking with her about talking with her PCP about being put on an SSRI -- NOT another BZD (especially in light of her feeling chronic fatigue).  She is on Plavix without aspirin as well as Imdur and Toprol low dose. At this point,I am reluctant to further titrate medications.I finally convinced her that the metoprolol is doing an effective job.  Would not evaluate unless persistent anginal chest pain.

## 2017-02-23 NOTE — Assessment & Plan Note (Signed)
Most recent labs showed well controlled lipids on her current dose of Pravachol.  This is probably the best option to avoid "side effects".

## 2017-02-23 NOTE — Assessment & Plan Note (Signed)
Chronic fatigue which in my estimation is most consistent with Depression.  She needs to be on an SSRI.  I tried to explain to her the benefit of treating the underlying causes of poses symptoms with Klonopin.  We have already backed off on any other medications to avoid hypotension or bradycardia mediated fatigue.

## 2017-02-23 NOTE — Assessment & Plan Note (Signed)
This is no longer an issue with her. In fact we're dealing with possible hypotension.  Much better since converting from Carvedilol from metoprolol.

## 2017-02-23 NOTE — Assessment & Plan Note (Signed)
Unable to tolerate Amlodipine. Continue low dose Imdur

## 2017-02-24 ENCOUNTER — Telehealth: Payer: Self-pay | Admitting: Pulmonary Disease

## 2017-02-25 ENCOUNTER — Telehealth: Payer: Self-pay

## 2017-02-25 NOTE — Telephone Encounter (Signed)
Called the pharmacy and they are faxing the denial. Will hold in triage till this is received.    JN's recent OV note: Mild COPD with exacerbation: Treating with prednisone taper. Patient continuing to use Spiriva. Switching from Ventolin to Xopenex given palpitations. Patient instructed to use rescue inhaler 3 times daily while ill. Strongly urged her consideration of inhaled corticosteroid therapy/LABA.

## 2017-02-25 NOTE — Telephone Encounter (Signed)
Pt called asking that she has a PET tomorrow. She is concerned that she is on prednisone and was on antibiotics last week. And that these will give false results. This RN called nuc med and called pt back that these will not affect PET results.   She has not had BM in 5 days. She used 1 pack miralax yesterday and 1 today. Explained she can use 2 pack per day if needed. She is worried she will need to go BM while in PET test. She will hold off taking more until after the PET.  Forwarded to Dr Julien Nordmann for any other input.

## 2017-02-26 ENCOUNTER — Other Ambulatory Visit (HOSPITAL_BASED_OUTPATIENT_CLINIC_OR_DEPARTMENT_OTHER): Payer: Medicare Other

## 2017-02-26 ENCOUNTER — Telehealth: Payer: Self-pay | Admitting: Pulmonary Disease

## 2017-02-26 ENCOUNTER — Ambulatory Visit (HOSPITAL_COMMUNITY)
Admission: RE | Admit: 2017-02-26 | Discharge: 2017-02-26 | Disposition: A | Discharge status: Home / Self Care | Payer: Medicare Other | Source: Ambulatory Visit | Attending: Internal Medicine | Admitting: Internal Medicine

## 2017-02-26 DIAGNOSIS — I700 Atherosclerosis of aorta: Secondary | ICD-10-CM | POA: Diagnosis not present

## 2017-02-26 DIAGNOSIS — I2510 Atherosclerotic heart disease of native coronary artery without angina pectoris: Secondary | ICD-10-CM | POA: Insufficient documentation

## 2017-02-26 DIAGNOSIS — J438 Other emphysema: Secondary | ICD-10-CM | POA: Diagnosis not present

## 2017-02-26 DIAGNOSIS — C3491 Malignant neoplasm of unspecified part of right bronchus or lung: Secondary | ICD-10-CM

## 2017-02-26 DIAGNOSIS — C3490 Malignant neoplasm of unspecified part of unspecified bronchus or lung: Secondary | ICD-10-CM | POA: Diagnosis not present

## 2017-02-26 DIAGNOSIS — J432 Centrilobular emphysema: Secondary | ICD-10-CM | POA: Diagnosis not present

## 2017-02-26 LAB — CBC WITH DIFFERENTIAL/PLATELET
BASO%: 0.2 % (ref 0.0–2.0)
Basophils Absolute: 0 10*3/uL (ref 0.0–0.1)
EOS%: 0.6 % (ref 0.0–7.0)
Eosinophils Absolute: 0 10*3/uL (ref 0.0–0.5)
HCT: 42.2 % (ref 34.8–46.6)
HGB: 14 g/dL (ref 11.6–15.9)
LYMPH%: 11.1 % — ABNORMAL LOW (ref 14.0–49.7)
MCH: 31.7 pg (ref 25.1–34.0)
MCHC: 33.2 g/dL (ref 31.5–36.0)
MCV: 95.7 fL (ref 79.5–101.0)
MONO#: 0.6 10*3/uL (ref 0.1–0.9)
MONO%: 9.1 % (ref 0.0–14.0)
NEUT#: 5.2 10*3/uL (ref 1.5–6.5)
NEUT%: 79 % — ABNORMAL HIGH (ref 38.4–76.8)
Platelets: 151 10*3/uL (ref 145–400)
RBC: 4.41 10*6/uL (ref 3.70–5.45)
RDW: 13.5 % (ref 11.2–14.5)
WBC: 6.6 10*3/uL (ref 3.9–10.3)
lymph#: 0.7 10*3/uL — ABNORMAL LOW (ref 0.9–3.3)

## 2017-02-26 LAB — COMPREHENSIVE METABOLIC PANEL
ALT: 8 U/L (ref 0–55)
AST: 14 U/L (ref 5–34)
Albumin: 3.7 g/dL (ref 3.5–5.0)
Alkaline Phosphatase: 67 U/L (ref 40–150)
Anion Gap: 8 mEq/L (ref 3–11)
BUN: 15 mg/dL (ref 7.0–26.0)
CO2: 27 mEq/L (ref 22–29)
Calcium: 9.4 mg/dL (ref 8.4–10.4)
Chloride: 109 mEq/L (ref 98–109)
Creatinine: 0.7 mg/dL (ref 0.6–1.1)
EGFR: >90 mL/min/{1.73_m2} (ref >90)
Glucose: 97 mg/dl (ref 70–140)
Potassium: 3.8 mEq/L (ref 3.5–5.1)
Sodium: 143 mEq/L (ref 136–145)
Total Bilirubin: 0.39 mg/dL (ref 0.20–1.20)
Total Protein: 6.1 g/dL — ABNORMAL LOW (ref 6.4–8.3)

## 2017-02-26 LAB — GLUCOSE, CAPILLARY: Glucose-Capillary: 100 mg/dL — ABNORMAL HIGH (ref 65–99)

## 2017-02-26 MED ORDER — LEVALBUTEROL TARTRATE 45 MCG/ACT IN AERO: 1.0000 | INHALATION_SPRAY | Freq: Four times a day (QID) | RESPIRATORY_TRACT | 12 refills | Status: DC | PRN

## 2017-02-26 MED ORDER — FLUDEOXYGLUCOSE F - 18 (FDG) INJECTION
6.1500 | Freq: Once | INTRAVENOUS | Status: AC
  Administered 2017-02-26: 6.15 via INTRAVENOUS

## 2017-02-26 NOTE — Telephone Encounter (Signed)
Pt is aware that PA for Xopenex has been started. Pt aware that our office will update her accordingly.

## 2017-02-26 NOTE — Telephone Encounter (Signed)
Refill request from [facility] for Xopenex. Filled for patient.

## 2017-02-26 NOTE — Telephone Encounter (Signed)
Pt calling back about the prior approval on her Xopenex. -tr

## 2017-02-26 NOTE — Telephone Encounter (Signed)
PA was received. I called [employer] at [phone]. PA was started. EOC ID: [id number]. We should receive a call back from a representative to answer clinical questions.

## 2017-02-27 ENCOUNTER — Ambulatory Visit (INDEPENDENT_AMBULATORY_CARE_PROVIDER_SITE_OTHER): Payer: Medicare Other | Admitting: Acute Care

## 2017-02-27 ENCOUNTER — Encounter: Payer: Self-pay | Admitting: Acute Care

## 2017-02-27 DIAGNOSIS — J449 Chronic obstructive pulmonary disease, unspecified: Secondary | ICD-10-CM | POA: Diagnosis not present

## 2017-02-27 DIAGNOSIS — J209 Acute bronchitis, unspecified: Secondary | ICD-10-CM | POA: Diagnosis not present

## 2017-02-27 DIAGNOSIS — B370 Candidal stomatitis: Secondary | ICD-10-CM

## 2017-02-27 DIAGNOSIS — I208 Other forms of angina pectoris: Secondary | ICD-10-CM | POA: Diagnosis not present

## 2017-02-27 MED ORDER — NYSTATIN 100000 UNIT/ML MT SUSP: 5.0000 mL | Freq: Four times a day (QID) | OROMUCOSAL | 0 refills | Status: DC

## 2017-02-27 NOTE — Assessment & Plan Note (Addendum)
Mild COPD exacerbation Most completed prednisone taper Please  Continue Spiriva We will phone in the Xopenex inhaler for you. Please use 3 times daily until your better When  you are better use as needed up to 4 times daily Please consider using an inhaled corticosteroid/log by inhaler Follow-up in November with Dr. Earney Mallet as a scheduled  follow up

## 2017-02-27 NOTE — Assessment & Plan Note (Signed)
Slow to resolve bronchitis and cough Sputum collected 920 was rejected by lab Chest x-ray showed no acute process Plan Recall elects sputum for culture AFB and fungus Follow-up with Dr. Earney Mallet in November as a scheduled Please contact office for sooner follow up if symptoms do not improve or worsen or seek emergency care
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
## 2017-02-27 NOTE — Telephone Encounter (Signed)
Pt came in today to see SG and asked about the status of inhaler. I called [employer] and gave clinical information for the Xopenex. Reference # [id number]. He said he would mark it urgent since she has been waiting for 2 weeks for this medication.

## 2017-02-27 NOTE — Assessment & Plan Note (Signed)
Patient with oral thrush after treatment with prednisone Plan Nystatin mouthwash 5 mL 3-4 times daily 10 days

## 2017-02-27 NOTE — Patient Instructions (Addendum)
It is nice to meet you today. Please collect another sputum for culture , AFB, fungus and culture. Bring it to the office  within 4 hours of collection We will call you with culture results once they have resulted Nystatin mouth wash 5 cc's fthree - our times daily x 14 days Use Xopenex 3 times daily while sick, then revert back to as needed. Tussionex at night for cough as needed. Try Delsym during the day for cough Follow up in November with Dr. Halford Chessman as is scheduled. Please contact office for sooner follow up if symptoms do not improve or worsen or seek emergency care.

## 2017-02-28 ENCOUNTER — Telehealth: Payer: Self-pay | Admitting: Pulmonary Disease

## 2017-02-28 NOTE — Telephone Encounter (Signed)
Received fax from [facility] stating that Xopenex HFA is requiring PA Per pt's chart, she is intolerant to Albuterol because it causes palpitations  [facility] @ [phone] and provided the plan # [id number] Prior authorization has been initiated through their automated system - was not able to provide reasoning for the PA and was not given an option to speak with a representative  EOC ID # [id number]  Will forward to [place] for follow up

## 2017-02-28 NOTE — Progress Notes (Signed)
Note reviewed.  Sonia Baller Ashok Cordia, M.D. [facility] Pulmonary & Critical Care Pager:  [phone] After 3pm or if no response, call [phone] 7:07 AM 02/28/17

## 2017-03-03 ENCOUNTER — Other Ambulatory Visit: Payer: Self-pay | Admitting: Pulmonary Disease

## 2017-03-03 ENCOUNTER — Telehealth: Payer: Self-pay | Admitting: Cardiology

## 2017-03-03 ENCOUNTER — Other Ambulatory Visit: Payer: Medicare Other

## 2017-03-03 ENCOUNTER — Telehealth: Payer: Self-pay | Admitting: Pulmonary Disease

## 2017-03-03 DIAGNOSIS — R5382 Chronic fatigue, unspecified: Secondary | ICD-10-CM

## 2017-03-03 DIAGNOSIS — G9332 Myalgic encephalomyelitis/chronic fatigue syndrome: Secondary | ICD-10-CM

## 2017-03-03 IMAGING — CR DG CHEST 2V
2 series · 2 of 2 positions shown · non-contrast
Comparison: Radiograph [REDACTED]

CLINICAL DATA: Lower chest pain for several days.

EXAM:
CHEST  2 VIEW

[w chest pa]
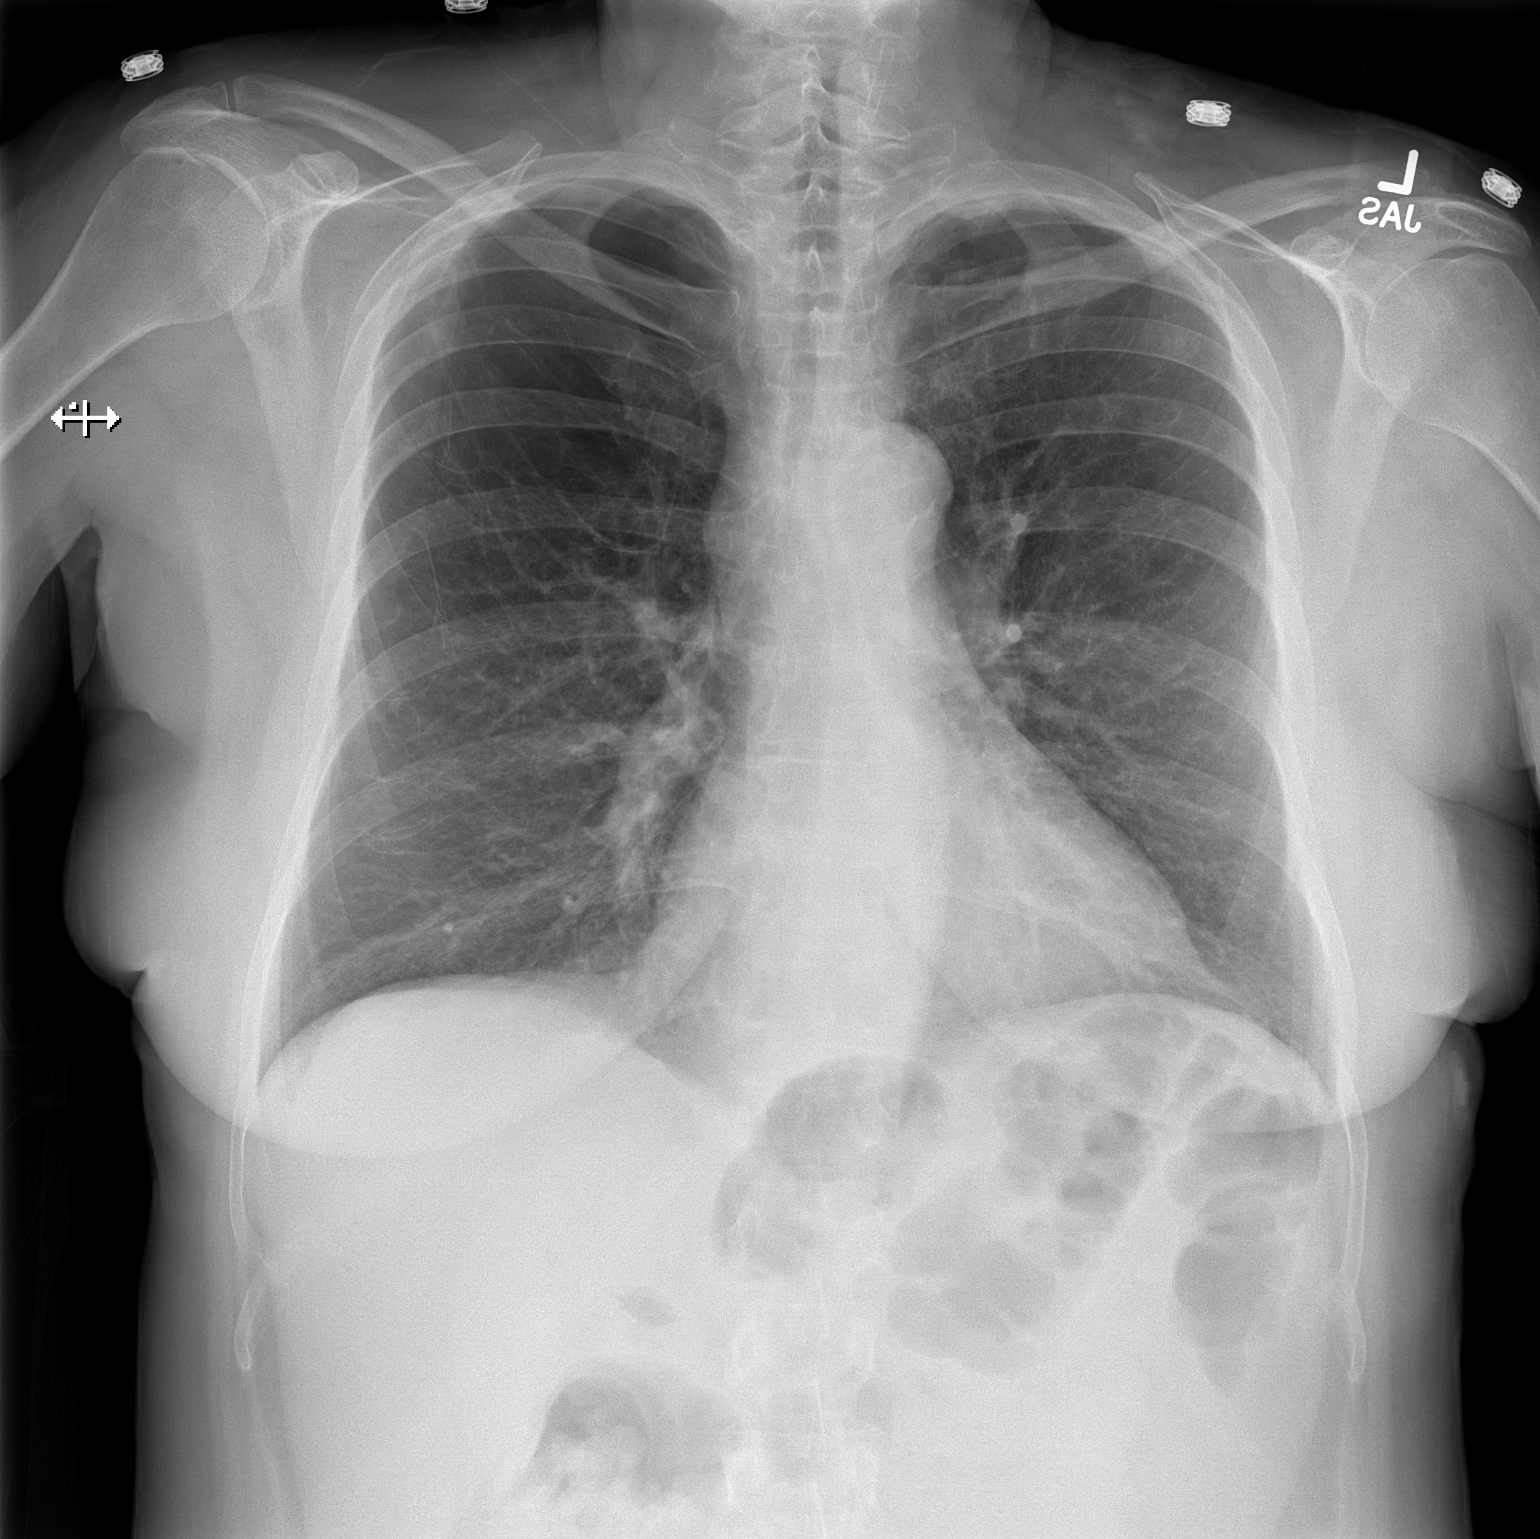

[w chest lat]
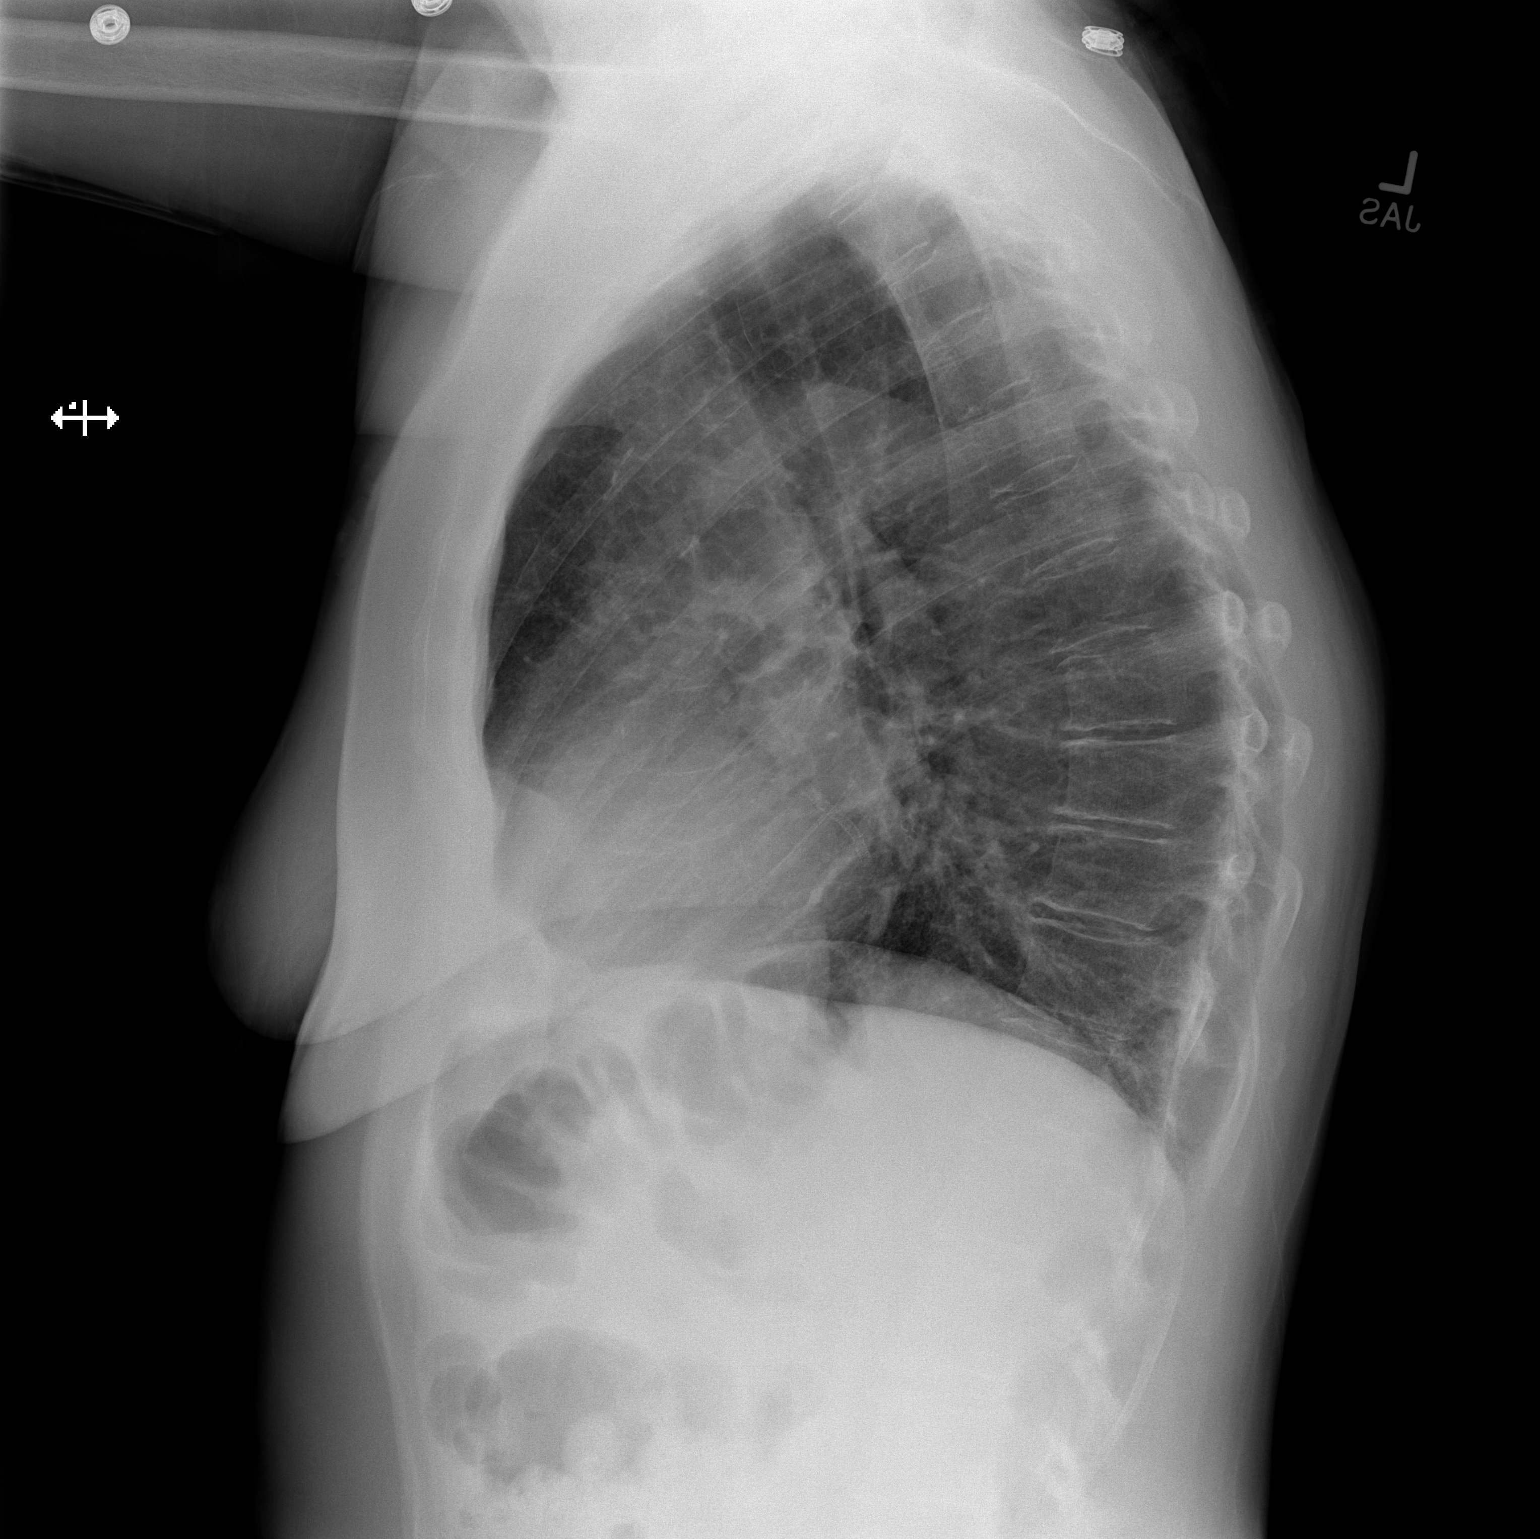

[2 of 2 positions shown; findings below may reference images not displayed]

FINDINGS: Normal cardiac silhouette. Lungs are hyperinflated. There is chronic
bronchitic markings. No effusion, infiltrate, pneumothorax.
IMPRESSION: Hyperinflated lungs with mild bronchitic markings. No acute
findings.

## 2017-03-03 NOTE — Telephone Encounter (Signed)
New message    Pt is calling stating that she is having quivering in her heart. She said she feels weak and its worse when she lays down. She said that she is concerned about one of her stents closing up. Please call.

## 2017-03-03 NOTE — Telephone Encounter (Signed)
Spoke with pt, advised her Xopenex is covered by insurance and ready for her to pick up. While we were on the phone she stated her heart felt quivery and she was going to call an ambulance. I advised her to call right now to get transported to the hospital. She agreed. FYI JN.

## 2017-03-03 NOTE — Telephone Encounter (Signed)
Returned call to patient of Dr. [name]. She saw MD on 9/21. She has always had palpitations underneath her breast off and on but lately (since 9/21) she has palpitations even when she lays down, her heart feels like it is quivering - she states this is new but occurred when she had her heart attack. She reports feeling real weak. Patient is compliant w/medications as prescribed. Patient had been on a course of doxycycline, which is now completed. Patient has taken xanax PRN. Stressed importance of using PRN meds if needed (NTG, xanax). Her HR has been in the high 80s, BP averages mid 120s-140s. Patient became very anxious/upset. She repeated that her heart is quivering and this causes her to not be able to breathe and that this is not the regular palpitations that she normally has. Advised she should seek ED eval for acute symptoms.   Routed to Dr. [name] for any further advice

## 2017-03-03 NOTE — Telephone Encounter (Signed)
Received fax from [facility]. PA has been approved for the patient. I will contact pharmacy regarding information given.

## 2017-03-04 NOTE — Telephone Encounter (Signed)
See 10/1 phone note.  Will close this encounter.

## 2017-03-05 ENCOUNTER — Encounter: Payer: Self-pay | Admitting: Internal Medicine

## 2017-03-05 ENCOUNTER — Ambulatory Visit (HOSPITAL_BASED_OUTPATIENT_CLINIC_OR_DEPARTMENT_OTHER): Payer: Medicare Other | Admitting: Internal Medicine

## 2017-03-05 ENCOUNTER — Telehealth: Payer: Self-pay | Admitting: Internal Medicine

## 2017-03-05 VITALS — BP 101/50 | HR 71 | Temp 98.0°F | Resp 17 | Ht 65.5 in | Wt 115.4 lb

## 2017-03-05 DIAGNOSIS — Z85118 Personal history of other malignant neoplasm of bronchus and lung: Secondary | ICD-10-CM

## 2017-03-05 DIAGNOSIS — C3491 Malignant neoplasm of unspecified part of right bronchus or lung: Secondary | ICD-10-CM
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
## 2017-03-05 NOTE — Telephone Encounter (Signed)
Scheduled appt per 10/3 los - Gave patient AVS and calender per los - central radiology to contact patient with scan appt.

## 2017-03-06 ENCOUNTER — Telehealth: Payer: Self-pay | Admitting: Pulmonary Disease

## 2017-03-06 DIAGNOSIS — J439 Emphysema, unspecified: Secondary | ICD-10-CM | POA: Diagnosis not present

## 2017-03-06 DIAGNOSIS — K219 Gastro-esophageal reflux disease without esophagitis: Secondary | ICD-10-CM | POA: Diagnosis not present

## 2017-03-06 DIAGNOSIS — R7303 Prediabetes: Secondary | ICD-10-CM | POA: Diagnosis not present

## 2017-03-06 DIAGNOSIS — K625 Hemorrhage of anus and rectum: Secondary | ICD-10-CM | POA: Diagnosis not present

## 2017-03-06 DIAGNOSIS — C3491 Malignant neoplasm of unspecified part of right bronchus or lung: Secondary | ICD-10-CM | POA: Diagnosis not present

## 2017-03-06 DIAGNOSIS — R109 Unspecified abdominal pain: Secondary | ICD-10-CM | POA: Diagnosis not present

## 2017-03-06 DIAGNOSIS — F419 Anxiety disorder, unspecified: Secondary | ICD-10-CM | POA: Diagnosis not present

## 2017-03-06 DIAGNOSIS — I2510 Atherosclerotic heart disease of native coronary artery without angina pectoris: Secondary | ICD-10-CM | POA: Diagnosis not present

## 2017-03-06 DIAGNOSIS — F321 Major depressive disorder, single episode, moderate: Secondary | ICD-10-CM | POA: Diagnosis not present

## 2017-03-06 DIAGNOSIS — Z0000 Encounter for general adult medical examination without abnormal findings: Secondary | ICD-10-CM | POA: Diagnosis not present

## 2017-03-06 DIAGNOSIS — E7800 Pure hypercholesterolemia, unspecified: Secondary | ICD-10-CM | POA: Diagnosis not present

## 2017-03-06 DIAGNOSIS — R002 Palpitations: Secondary | ICD-10-CM | POA: Diagnosis not present

## 2017-03-06 NOTE — Telephone Encounter (Signed)
Thrush Patient with oral thrush after treatment with prednisone Plan Nystatin mouthwash 5 mL 3-4 times daily 10 days    Michele Spatz, NP 02/27/2017  9:11 PM  Spoke with pt, advised Nystatin is for thrush. Pt states she has been swallowing the solution and I advised her to gargle but spit out. She states she still has white stuff on her tongue but encouraged her to finish the course for a total of 10 days. I also encouraged her to go to an urgent care if her abdominal pain does not subside. FYI SG.

## 2017-03-06 NOTE — Telephone Encounter (Signed)
No significant drug-drug interaction noted between Remeron and her cardiac medication.  Please follow PCP recommendations

## 2017-03-06 NOTE — Telephone Encounter (Signed)
She reports tingling in her legs and "drawing" in her hands  She reports some blood when wiping. She has h/o hemorrhoids. Advised if she has blood in toilet after voiding that is different than blood noticed when wiping.   Patient asked why she is on nystatin. Advised she contact prescribing provider Eric Form, NP to inquire about this  Advised patient that on pharmacist's advice regarding medications. She voiced understanding. She also asked if this medication will get rid of her depression and I informed her that I am unable to answer this question.

## 2017-03-06 NOTE — Telephone Encounter (Signed)
Follow     PcP wants to put her on medication for depression and she wants to know if it will effect her other medication rameron 15mg  ???   Pt c/o medication issue:  1. Name of Medication: rameron 15mg ?   2. How are you currently taking this medication (dosage and times per day)?  Not taking it yet   3. Are you having a reaction (difficulty breathing--STAT)?  Not taking it yet   4. What is your medication issue? PcP wants to put her on medication for depression and she wants to know if it will effect her other medication rameron 15mg  ???

## 2017-03-06 NOTE — Telephone Encounter (Signed)
Patient's PCP would like to Rx Remeron 15mg . She would like to know if this is OK to take with her other meds. Routed to pharm/MD to review + advise

## 2017-03-07 ENCOUNTER — Telehealth: Payer: Self-pay | Admitting: Internal Medicine

## 2017-03-07 MED ORDER — CLOTRIMAZOLE 10 MG MT TROC: 10.0000 mg | Freq: Four times a day (QID) | OROMUCOSAL | 2 refills | Status: DC | PRN

## 2017-03-07 NOTE — Telephone Encounter (Signed)
Has developed oral thrush, no fever or dysphagia  rec clotrimazole troche 10 mg qid prn #12

## 2017-03-07 NOTE — Telephone Encounter (Signed)
Noted. Thanks so much 

## 2017-03-19 ENCOUNTER — Telehealth: Payer: Self-pay | Admitting: Cardiology

## 2017-03-19 DIAGNOSIS — R002 Palpitations: Secondary | ICD-10-CM

## 2017-03-19 NOTE — Telephone Encounter (Signed)
New message   Pt verbalized that she wants to wear a holter monitor   Please call pt

## 2017-03-19 NOTE — Telephone Encounter (Signed)
Spoke with patient and she has been having issues with her heart pounding and waking her up at night. Stated it happens during the day but worse at night especially when she lays on her left side. Episodes are daily and every night. Does have shortness of breath during these times. Stated these are different than what she has had in the past. She would like to wear a monitor  Will forward to Dr [name] for review

## 2017-03-20 DIAGNOSIS — M1711 Unilateral primary osteoarthritis, right knee: Secondary | ICD-10-CM | POA: Diagnosis not present

## 2017-03-20 DIAGNOSIS — M545 Low back pain: Secondary | ICD-10-CM | POA: Diagnosis not present

## 2017-03-20 DIAGNOSIS — M170 Bilateral primary osteoarthritis of knee: Secondary | ICD-10-CM | POA: Diagnosis not present

## 2017-03-21 ENCOUNTER — Telehealth: Payer: Self-pay | Admitting: Pulmonary Disease

## 2017-03-21 NOTE — Telephone Encounter (Signed)
Spoke with patient. She is aware of results.   Nothing else needed at time of call.  

## 2017-03-21 NOTE — Telephone Encounter (Signed)
Follow up    Patient calling to request order for holter monitor.

## 2017-03-21 NOTE — Telephone Encounter (Signed)
Called and spoke to pt to reschedule appt from 10.22.18 (clinic day is being moved). While on the phone with pt she requested the sputum results from 10.1.18, advised pt that the final results would not be back for 6 weeks. Pt requesting the preliminary results.   Dr. Ashok Cordia please advise. Thanks.

## 2017-03-21 NOTE — Telephone Encounter (Signed)
Spoke w patient.   Symptoms she reports are ongoing for some amount of time (since prior to [name]), but she states they became "different" a few weeks ago following last visit.  Pt anxious bc her palpitations are happening nightly and sometimes during the day. She expresses concern, noting, "I don't want to have this go on and have a heart attack".  Aware I will send to Dr. [name] for review of appropriateness of holter monitor or other testing.

## 2017-03-21 NOTE — Telephone Encounter (Signed)
Preliminary culture was negative but specimen was inadequate and seemed to contain more oral flora.

## 2017-03-22 LAB — FUNGUS CULTURE W SMEAR
MICRO NUMBER:: 81047523
SMEAR:: NONE SEEN
SPECIMEN QUALITY:: ADEQUATE

## 2017-03-22 NOTE — Telephone Encounter (Signed)
I am OK with ordering 48 hr monitor.,  [facility]

## 2017-03-24 ENCOUNTER — Ambulatory Visit: Payer: Self-pay | Admitting: Pulmonary Disease

## 2017-03-24 NOTE — Telephone Encounter (Signed)
Scheduled monitor for tomorrow morning  Patient aware of date and time.

## 2017-03-25 ENCOUNTER — Ambulatory Visit (INDEPENDENT_AMBULATORY_CARE_PROVIDER_SITE_OTHER): Payer: Medicare Other

## 2017-03-25 DIAGNOSIS — R002 Palpitations: Secondary | ICD-10-CM | POA: Diagnosis not present

## 2017-03-26 ENCOUNTER — Telehealth: Payer: Self-pay | Admitting: Cardiology

## 2017-03-26 NOTE — Telephone Encounter (Signed)
New message    Pt is calling about her heart monitor. She said she isn't sure if the R was there for recording yesterday because it wasn't in [place] time. She is asking for a call back.

## 2017-03-26 NOTE — Telephone Encounter (Signed)
Called patient back regarding holter monitor.  Told patient when monitor is brought back Thursday, we will be able to tell if 48 hours of data was uploaded and call her at that time.  If not we can schedule for a rehook.   Was not sure patient understood [employer] time because she mentioned the monitor had the same time as her clock in the morning.  She does not remember seeing R for recording on the monitor yesterday but it was there today.  Explained it would be quite unusual for the monitor to stop recording and then start up again.  However, we will call her once we upload her monitor tomorrow.  Patient was thankful for the call back.
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
## 2017-03-31 ENCOUNTER — Other Ambulatory Visit: Payer: Medicare Other

## 2017-03-31 ENCOUNTER — Encounter: Payer: Self-pay | Admitting: Pulmonary Disease

## 2017-03-31 ENCOUNTER — Ambulatory Visit (INDEPENDENT_AMBULATORY_CARE_PROVIDER_SITE_OTHER): Payer: Medicare Other | Admitting: Pulmonary Disease

## 2017-03-31 VITALS — BP 118/70 | HR 69 | Ht 65.0 in | Wt 113.8 lb

## 2017-03-31 DIAGNOSIS — J439 Emphysema, unspecified: Secondary | ICD-10-CM

## 2017-03-31 DIAGNOSIS — R059 Cough, unspecified: Secondary | ICD-10-CM

## 2017-03-31 DIAGNOSIS — R05 Cough: Secondary | ICD-10-CM | POA: Diagnosis not present

## 2017-03-31 DIAGNOSIS — C3490 Malignant neoplasm of unspecified part of unspecified bronchus or lung: Secondary | ICD-10-CM | POA: Diagnosis not present

## 2017-03-31 DIAGNOSIS — I208 Other forms of angina pectoris: Secondary | ICD-10-CM | POA: Diagnosis not present

## 2017-03-31 DIAGNOSIS — J449 Chronic obstructive pulmonary disease, unspecified: Secondary | ICD-10-CM

## 2017-03-31 NOTE — Patient Instructions (Signed)
   Keep using your Spiriva inhaler daily.  Try using your Xopenex inhaler 3 times a day to see if this helps your coughing. If it does it would be a good indicator that you may benefit from additional inhaler medication.  Call our office if you have any questions.  TESTS ORDERED: 1. Alpha-1 Antitrypsin Phenotype

## 2017-04-03 ENCOUNTER — Telehealth: Payer: Self-pay | Admitting: *Deleted

## 2017-04-03 LAB — ALPHA-1 ANTITRYPSIN PHENOTYPE: A-1 Antitrypsin, Ser: 147 mg/dL (ref 83–199)

## 2017-04-03 NOTE — Telephone Encounter (Signed)
-----   Message from Leonie Man, MD sent at 04/03/2017 12:15 AM EDT ----- Normal monitor findings.  Nothing to explain palpitations.  Very rare PACs and PVCs. No worrisome findings.  No worrisome rhythm  I would not change our therapy.  Glenetta Hew, MD

## 2017-04-03 NOTE — Telephone Encounter (Signed)
Patient reviewed result via mychart. Called to give result again. No appointment need at present time or changes With current medication or treatment per DR [facility]

## 2017-04-07 LAB — MYCOBACTERIA,CULT W/FLUOROCHROME SMEAR
MICRO NUMBER:: 81047521
SMEAR:: NONE SEEN
SPECIMEN QUALITY:: ADEQUATE

## 2017-04-09 ENCOUNTER — Telehealth: Payer: Self-pay | Admitting: Pulmonary Disease

## 2017-04-09 DIAGNOSIS — R636 Underweight: Secondary | ICD-10-CM | POA: Diagnosis not present

## 2017-04-09 DIAGNOSIS — K219 Gastro-esophageal reflux disease without esophagitis: Secondary | ICD-10-CM | POA: Diagnosis not present

## 2017-04-09 DIAGNOSIS — F419 Anxiety disorder, unspecified: Secondary | ICD-10-CM | POA: Diagnosis not present

## 2017-04-09 NOTE — Telephone Encounter (Signed)
JN can you review lab before I call pt to explain the lab test.

## 2017-04-09 NOTE — Telephone Encounter (Signed)
She does not have Alpha-1 Antitrypsin deficiency.

## 2017-04-09 NOTE — Telephone Encounter (Signed)
Patient is calling for results, CB is [phone].

## 2017-04-09 NOTE — Telephone Encounter (Signed)
Called and spoke with pt and she is aware of lab results. Nothing further is needed.

## 2017-04-14 ENCOUNTER — Telehealth: Payer: Self-pay | Admitting: Cardiology

## 2017-04-14 NOTE — Telephone Encounter (Signed)
The cardiac concern would be for prolongation of QT interval.  Her last EKG showed normal interval and she is no other medications that would cause prolongation.   She should be fine.

## 2017-04-14 NOTE — Telephone Encounter (Signed)
New message   1. Heart monitor results   Pt C/O medication issue:  1. Name of Medication: serpraline 50 mg break in half   2. How are you currently taking this medication (dosage and times per day)? One time a day 1/2 pill in am   3. Are you having a reaction (difficulty breathing--STAT)? No   4. What is your medication issue? Pulse rate up Michele [name], went online research - PCP was contacted waiting on call back.

## 2017-04-14 NOTE — Telephone Encounter (Signed)
Returned the call to the patient. She stated that she recently started on Zoloft (25 mg for the first week and then 50 mg daily). She stated that she had googled the medication and had read that it can cause a heart attack and hardening of the arteries which she stated that she currently has. She has been advised that google is not the best place to read about side effects of medications and that she can call her pharmacist or prescriber. She would like for Dr. [name] to be aware that she is currently on this medication and she feels that it is lowering her heart rate to the 60's.  She has also been made aware of her monitor results and verbalized her understanding.  Will route to the provider and pharmd for their knowledge and recommendation.

## 2017-04-14 NOTE — Telephone Encounter (Signed)
I have been trying to get her to take a medicine like Zoloft for 3 or 4 years now.  I am perfectly fine with her being on it.  Please stop looking at [place], MD

## 2017-04-15 NOTE — Telephone Encounter (Signed)
Returned the call to the patient to advise her of Dr. Allison Quarry and pharmd's responses. She verbalized her understanding.

## 2017-04-17 LAB — MYCOBACTERIA,CULT W/FLUOROCHROME SMEAR
MICRO NUMBER:: 81085277
SMEAR:: NONE SEEN
SPECIMEN QUALITY:: ADEQUATE

## 2017-04-18 ENCOUNTER — Other Ambulatory Visit: Payer: Self-pay | Admitting: Radiation Therapy

## 2017-04-18 DIAGNOSIS — C7949 Secondary malignant neoplasm of other parts of nervous system: Secondary | ICD-10-CM

## 2017-04-18 DIAGNOSIS — C7931 Secondary malignant neoplasm of brain: Secondary | ICD-10-CM

## 2017-04-21 ENCOUNTER — Ambulatory Visit: Payer: Self-pay | Admitting: Physician Assistant

## 2017-04-21 ENCOUNTER — Ambulatory Visit (INDEPENDENT_AMBULATORY_CARE_PROVIDER_SITE_OTHER): Payer: Medicare Other | Admitting: Pulmonary Disease

## 2017-04-21 ENCOUNTER — Encounter: Payer: Self-pay | Admitting: Pulmonary Disease

## 2017-04-21 VITALS — BP 98/64 | HR 60 | Ht 65.0 in | Wt 111.8 lb

## 2017-04-21 DIAGNOSIS — R64 Cachexia: Secondary | ICD-10-CM | POA: Diagnosis not present

## 2017-04-21 DIAGNOSIS — J4489 Other specified chronic obstructive pulmonary disease: Secondary | ICD-10-CM

## 2017-04-21 DIAGNOSIS — J449 Chronic obstructive pulmonary disease, unspecified: Secondary | ICD-10-CM

## 2017-04-21 DIAGNOSIS — R001 Bradycardia, unspecified: Secondary | ICD-10-CM | POA: Diagnosis not present

## 2017-04-21 DIAGNOSIS — R0689 Other abnormalities of breathing: Secondary | ICD-10-CM | POA: Diagnosis not present

## 2017-04-21 DIAGNOSIS — R42 Dizziness and giddiness: Secondary | ICD-10-CM

## 2017-04-21 DIAGNOSIS — I208 Other forms of angina pectoris: Secondary | ICD-10-CM | POA: Diagnosis not present

## 2017-04-21 NOTE — Progress Notes (Signed)
Current Outpatient Medications on File Prior to Visit  Medication Sig  . albuterol (PROVENTIL HFA;VENTOLIN HFA) 108 (90 Base) MCG/ACT inhaler Inhale 1-2 puffs into the lungs every 6 (six) hours as needed for wheezing or shortness of breath.  . ALPRAZolam (XANAX) 1 MG tablet Take 1 mg by mouth 3 (three) times daily as needed for anxiety.  . Calcium Carbonate-Vitamin D (CALCIUM 600+D) 600-400 MG-UNIT tablet Take 1 tablet by mouth daily.  . chlorpheniramine-HYDROcodone (TUSSIONEX PENNKINETIC ER) 10-8 MG/5ML SUER Take 5 mLs by mouth at bedtime as needed for cough.  . clopidogrel (PLAVIX) 75 MG tablet TAKE 1 TABLET(75 MG) BY MOUTH DAILY  . clotrimazole (MYCELEX) 10 MG troche Take 1 tablet (10 mg total) by mouth 4 (four) times daily as needed.  . Coenzyme Q10 (COQ10) 100 MG CAPS Take 100 mg by mouth daily.   Michele [name] Ethyl (VASCEPA) 1 g CAPS Take 1 g by mouth daily.   . isosorbide mononitrate (IMDUR) 30 MG 24 hr tablet TAKE 1 TABLET(30 MG) BY MOUTH AT BEDTIME  . levalbuterol (XOPENEX HFA) 45 MCG/ACT inhaler Inhale 1-2 puffs into the lungs every 6 (six) hours as needed for wheezing.  . metoprolol succinate (TOPROL XL) 25 MG 24 hr tablet Take 1 tablet (25 mg total) by mouth every evening. (Patient taking differently: Take 25 mg by mouth at bedtime. )  . neomycin-polymyxin b-dexamethasone (MAXITROL) 3.5-10000-0.1 SUSP SHAKE LQ AND INT 1 GTT IN OU TID  . nitroGLYCERIN (NITROSTAT) 0.4 MG SL tablet DISSOLVE 1 TABLET UNDER THE TONGUE EVERY 5 MINUTES AS NEEDED FOR CHEST PAIN  . nystatin (MYCOSTATIN) 100000 UNIT/ML suspension Take 5 mLs (500,000 Units total) by mouth 4 (four) times daily. Three to four times daily x14 days.  . pantoprazole (PROTONIX) 40 MG tablet Take 40 mg by mouth daily.  . polyethylene glycol (MIRALAX / GLYCOLAX) packet Take 17 g by mouth daily. (Patient taking differently: Take 17 g by mouth daily as needed for mild constipation. )  . pravastatin (PRAVACHOL) 40 MG tablet TAKE 1 TABLET(40  MG) BY MOUTH EVERY EVENING  . senna-docusate (SENOKOT-S) 8.6-50 MG tablet Take 1 tablet by mouth 2 (two) times daily as needed for mild constipation.  . sertraline (ZOLOFT) 20 MG/ML concentrated solution Take 50 mg daily by mouth.  . tiotropium (SPIRIVA) 18 MCG inhalation capsule Place 18 mcg into inhaler and inhale daily.   Current Facility-Administered Medications on File Prior to Visit  Medication  . HYDROcodone-acetaminophen (NORCO/VICODIN) 5-325 MG per tablet 1 tablet     Chief Complaint  Patient presents with  . Follow-up    Pt has productive cough - thick white mucus, pt has chest pain and chest tightness with upper and mid back pain. Pt rates pain 7 out of 10.     Pulmonary tests PFT 12/23/11 >> FEV1 2.13 (85%), FEV1% 59, TLC 5.98 (112%), DLCO 60% CT chest 08/24/16 >> CAD, mod/severe emphysema  Cardiac tests Echo 12/19/13 >> EF 65 to 70%  Past medical history Anxiety, Depression, Palpitations, CAD, HTN, CKD, HLD, GERD, Hiatal hernia, RLS, Seizures, PPD positive  Past surgical history, Family history, Social history, Allergies all reviewed.  Vital Signs BP 98/64 (BP Location: Left Arm, Cuff Size: Normal)   Pulse 60   Ht 5\' 5"  (1.651 m)   Wt 111 lb 12.8 oz (50.7 kg)   SpO2 98%   BMI 18.60 kg/m   History of Present Illness Michele [name] is a 61 y.o. female former smoker with COPD/emphysema.  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  She has been feeling weak.  She gets dizzy.  Her blood pressure sometimes dips into the 90's.  Her heart rate is also low.  She has felt her heart rate go into the 30's.    She has discomfort with swallowing.  She has persistent cough with thick, white phlegm.  She gets tight in her chest.  She is not having wheezing.  She denies fever, sweats, hemoptysis, or swelling.  She is maintaining her weight.  She is using spiriva and xopenex.  She has tried using other inhalers, but didn't feel these helped and were too  expensive.  She is followed by cardiology and has been on metroprol.  Her ECG today shows sinus bradycardia.  Physical Exam  General - No distress ENT - No sinus tenderness, no oral exudate, no LAN Cardiac - s1s2 regular, no murmur Chest - No wheeze/rales/dullness Back - No focal tenderness Abd - Soft, non-tender Ext - No edema Neuro - Normal strength Skin - No rashes Psych - normal mood, and behavior  CMP Latest Ref Rng & Units 02/26/2017 01/19/2017 12/05/2016  Glucose 70 - 140 mg/dl 97 68 108(H)  BUN 7.0 - 26.0 mg/dL 15.0 11 16  Creatinine 0.6 - 1.1 mg/dL 0.7 0.54 0.59  Sodium 136 - 145 mEq/L 143 144 144  Potassium 3.5 - 5.1 mEq/L 3.8 4.2 4.5  Chloride 101 - 111 mmol/L - 108 105  CO2 22 - 29 mEq/L 27 30 24   Calcium 8.4 - 10.4 mg/dL 9.4 9.9 9.6  Total Protein 6.4 - 8.3 g/dL 6.1(L) 6.4(L) 6.0  Total Bilirubin 0.20 - 1.20 mg/dL 0.39 0.5 0.4  Alkaline Phos 40 - 150 U/L 67 63 69  AST 5 - 34 U/L 14 23 17   ALT 0 - 55 U/L 8 13(L) 7   CBC Latest Ref Rng & Units 02/26/2017 01/19/2017 11/26/2016  WBC 3.9 - 10.3 10e3/uL 6.6 5.1 3.8(L)  Hemoglobin 11.6 - 15.9 g/dL 14.0 13.6 14.0  Hematocrit 34.8 - 46.6 % 42.2 40.0 41.3  Platelets 145 - 400 10e3/uL 151 150 147   Lab Results  Component Value Date   TSH 1.41 11/18/2016     Assessment/Plan  COPD with emphysema and chronic bronchitis. - advised that she can try using mucinex - continue spiriva and prn xopenex  Bradycardia and dizziness with hx of palpitations - will send a message to Dr. [name] with cardiology to address further - might need to lower or d/c metoprolol  Cachexia. - discussed options to assist with maintaining her weight  Hx of small cell lung cancer. - under observation with Dr. Julien Nordmann   Patient Instructions  Can try using mucinex  Will send a message to Dr. [name] about your heart rate and blood pressure   Time spent 27 minutes  Michele Mires, MD [facility] Pager:   [phone] 04/21/2017, 11:45 AM

## 2017-04-21 NOTE — Patient Instructions (Signed)
Can try using mucinex  Will send a message to Dr. [name] about your heart rate and blood pressure

## 2017-04-22 ENCOUNTER — Telehealth: Payer: Self-pay | Admitting: Cardiology

## 2017-04-22 MED ORDER — METOPROLOL SUCCINATE ER 25 MG PO TB24: 12.5000 mg | ORAL_TABLET | Freq: Every day | ORAL | 3 refills | Status: DC

## 2017-04-22 NOTE — Telephone Encounter (Signed)
Received information from Dr [name], informed patient to decrease metoprolol 25 mg to 12.5 mg at dinner time. Patient states  She takes medication at dinner time. She verbalized understanding. Call only if blood pressure goes above 160 SBP for more than 3-4 days in a row. CHANGED MEDICATION LIST

## 2017-04-22 NOTE — Telephone Encounter (Signed)
New Message  Pt call requesting to speak with RN about her low pulse. Pt states it has ranged from 38 to the 40, also in the 50. Please call back to discuss

## 2017-04-22 NOTE — Telephone Encounter (Signed)
A SPOKE TO PATIENT. PATIENT HAS BEEN  CHECKING PULSE WITH  PUSLE OX MACHINE AS WELL AS BLOOD PRESSURE CUFF  PATIENT STATES BLOOD PRESSURE HAS BEEN FLUCTUATING FROM  38  , 48 , 58  70's.  Patient states she has been becoming dizzy with the changes and states she has fallen once. Patient went pulmnologist office yesterday. Patient states pulm .asked her contact office concerning her cardiac medications.  Per patient information was to be sent to office.  RN tried to reassure patient, patient wore holter monitor @3 -4 weeks ago.  Informed patient will defer to Dr [name]

## 2017-04-25 ENCOUNTER — Encounter: Payer: Self-pay | Admitting: Pulmonary Disease

## 2017-04-30 DIAGNOSIS — H6983 Other specified disorders of Eustachian tube, bilateral: Secondary | ICD-10-CM | POA: Diagnosis not present

## 2017-04-30 DIAGNOSIS — R070 Pain in throat: Secondary | ICD-10-CM | POA: Diagnosis not present

## 2017-04-30 DIAGNOSIS — H6122 Impacted cerumen, left ear: Secondary | ICD-10-CM | POA: Diagnosis not present

## 2017-04-30 DIAGNOSIS — H7203 Central perforation of tympanic membrane, bilateral: Secondary | ICD-10-CM | POA: Diagnosis not present

## 2017-05-02 ENCOUNTER — Encounter: Payer: Self-pay | Admitting: Physician Assistant

## 2017-05-02 ENCOUNTER — Ambulatory Visit (INDEPENDENT_AMBULATORY_CARE_PROVIDER_SITE_OTHER): Payer: Medicare Other | Admitting: Physician Assistant

## 2017-05-02 ENCOUNTER — Other Ambulatory Visit (INDEPENDENT_AMBULATORY_CARE_PROVIDER_SITE_OTHER): Payer: Medicare Other

## 2017-05-02 VITALS — BP 118/60 | HR 62 | Ht 65.0 in | Wt 111.0 lb

## 2017-05-02 DIAGNOSIS — Z85118 Personal history of other malignant neoplasm of bronchus and lung: Secondary | ICD-10-CM

## 2017-05-02 DIAGNOSIS — R5383 Other fatigue: Secondary | ICD-10-CM | POA: Diagnosis not present

## 2017-05-02 DIAGNOSIS — R1031 Right lower quadrant pain: Secondary | ICD-10-CM

## 2017-05-02 DIAGNOSIS — R634 Abnormal weight loss: Secondary | ICD-10-CM

## 2017-05-02 DIAGNOSIS — I208 Other forms of angina pectoris: Secondary | ICD-10-CM | POA: Diagnosis not present

## 2017-05-02 LAB — COMPREHENSIVE METABOLIC PANEL
ALT: 11 U/L (ref 0–35)
AST: 15 U/L (ref 0–37)
Albumin: 4.5 g/dL (ref 3.5–5.2)
Alkaline Phosphatase: 65 U/L (ref 39–117)
BUN: 9 mg/dL (ref 6–23)
CO2: 30 mEq/L (ref 19–32)
Calcium: 10.4 mg/dL (ref 8.4–10.5)
Chloride: 104 mEq/L (ref 96–112)
Creatinine, Ser: 0.61 mg/dL (ref 0.40–1.20)
GFR: 105.25 mL/min (ref >60.00)
Glucose, Bld: 112 mg/dL — ABNORMAL HIGH (ref 70–99)
Potassium: 4.5 mEq/L (ref 3.5–5.1)
Sodium: 140 mEq/L (ref 135–145)
Total Bilirubin: 0.5 mg/dL (ref 0.2–1.2)
Total Protein: 6.8 g/dL (ref 6.0–8.3)

## 2017-05-02 LAB — CBC WITH DIFFERENTIAL/PLATELET
Basophils Absolute: 0 10*3/uL (ref 0.0–0.1)
Basophils Relative: 0.4 % (ref 0.0–3.0)
Eosinophils Absolute: 0 10*3/uL (ref 0.0–0.7)
Eosinophils Relative: 0.5 % (ref 0.0–5.0)
HCT: 44.7 % (ref 36.0–46.0)
Hemoglobin: 14.8 g/dL (ref 12.0–15.0)
Lymphocytes Relative: 10.7 % — ABNORMAL LOW (ref 12.0–46.0)
Lymphs Abs: 0.5 10*3/uL — ABNORMAL LOW (ref 0.7–4.0)
MCHC: 33 g/dL (ref 30.0–36.0)
MCV: 96.4 fl (ref 78.0–100.0)
Monocytes Absolute: 0.4 10*3/uL (ref 0.1–1.0)
Monocytes Relative: 7.7 % (ref 3.0–12.0)
Neutro Abs: 3.9 10*3/uL (ref 1.4–7.7)
Neutrophils Relative %: 80.7 % — ABNORMAL HIGH (ref 43.0–77.0)
Platelets: 165 10*3/uL (ref 150.0–400.0)
RBC: 4.64 Mil/uL (ref 3.87–5.11)
RDW: 14.1 % (ref 11.5–15.5)
WBC: 4.9 10*3/uL (ref 4.0–10.5)

## 2017-05-02 LAB — URINALYSIS
Bilirubin Urine: NEGATIVE
Hgb urine dipstick: NEGATIVE
Ketones, ur: NEGATIVE
Leukocytes, UA: NEGATIVE
Nitrite: NEGATIVE
Specific Gravity, Urine: 1.01 (ref 1.000–1.030)
Total Protein, Urine: NEGATIVE
Urine Glucose: NEGATIVE
Urobilinogen, UA: 0.2 (ref 0.0–1.0)
pH: 6 (ref 5.0–8.0)

## 2017-05-02 LAB — TSH: TSH: 1.57 u[IU]/mL (ref 0.35–4.50)

## 2017-05-02 LAB — SEDIMENTATION RATE: Sed Rate: 5 mm/hr (ref 0–30)

## 2017-05-02 MED ORDER — NYSTATIN 100000 UNIT/ML MT SUSP
OROMUCOSAL | 0 refills | Status: DC
Start: 2017-05-02 — End: 2017-06-17

## 2017-05-02 NOTE — Patient Instructions (Addendum)
Please go to the basement level to have your labs drawn.  We have sent the following medications to your pharmacy for you to pick up at your convenience: [facility] [place]. 1. Nystatin ( mycostatin)  oral suspension- Swish and spit.   Continue Protonix 40 mg, take 1 tab daily. Take Tylenol as needed for pain. Start a protein shake twice daily to increase calories.     If you are age 63 or younger, your body mass index should be between 19-25. Your Body mass index is 18.47 kg/m. If this is out of the aformentioned range listed, please consider follow up with your Primary Care Provider.    You have been scheduled for a CT scan of the abdomen and pelvis at [facility] ([address]---this is in the same building as [facility]).   You are scheduled on Tuesday 05-06-2017 at 10:00 am. You should arrive at 9:45 am to your appointment time for registration. Please follow the written instructions below on the day of your exam:  WARNING: IF YOU ARE ALLERGIC TO IODINE/X-RAY DYE, PLEASE NOTIFY RADIOLOGY IMMEDIATELY AT [phone]! YOU WILL BE GIVEN A 13 HOUR PREMEDICATION PREP.  1) Do not eat  anything after  (4 hours prior to your test) 2) You have been given 2 bottles of oral contrast to drink. The solution may taste               better if refrigerated, but do NOT add ice or any other liquid to this solution. Shake             well before drinking.    Drink 1 bottle of contrast @ 8:00 am (2 hours prior to your exam)  Drink 1 bottle of contrast @ 9:00 am (1 hour prior to your exam)  You may take any medications as prescribed with a small amount of water except for the following: Metformin, Glucophage, Glucovance, Avandamet, Riomet, Fortamet, Actoplus Met, Janumet, Glumetza or Metaglip. The above medications must be held the day of the exam AND 48 hours after the exam.  The purpose of you drinking the oral contrast is to aid in the visualization of your  intestinal tract. The contrast solution may cause some diarrhea. Before your exam is started, you will be given a small amount of fluid to drink. Depending on your individual set of symptoms, you may also receive an intravenous injection of x-ray contrast/dye. Plan on being at [facility] for 30 minutes or long, depending on the type of exam you are having performed.  If you have any questions regarding your exam or if you need to reschedule, you may call the CT department at [phone] between the hours of 8:00 am and 5:00 pm, Monday-Friday.  ________________________________________________________________________

## 2017-05-02 NOTE — Progress Notes (Signed)
I agree with the above note, plan 

## 2017-05-02 NOTE — Progress Notes (Signed)
Subjective:    Patient ID: Michele [name], female    DOB: Mar 14, 1956, 61 y.o.   MRN: [id number]  HPI Michele [name] is a 61 year old white female, known to Dr. Ardis Hughs.  She has chronic GERD and history of esophageal stricture, as well as history of adenomatous colon polyps. Patient comes in today with primary complaint of continued weight loss and also has new complaint of right lower quadrant pain. She has history of coronary artery disease a status post MI in 2013 and then drug-eluting stent.  She had PCI in June 2014.  She is maintained on Plavix, also has history of coronary spasm, hypertension, COPD, and anxiety. She was diagnosed with small cell lung cancer in September 2017 and has undergone chemotherapy and radiation.  She is followed by Dr. Earlie Server Last PET scan done in September 2018 showed no evidence of recurrence. She had CT of her abdomen done in August 2018 which showed atherosclerotic disease of the aorta and iliacs and a small adrenal adenoma but was otherwise unremarkable. Patient comes in today stating she just does not feel well and is worried about her continued weight loss.  She is concerned that she has had recurrence of cancer.  She had recently been started by her primary care provider on Zoloft and Xanax with concerns for depression and anxiety.  She does not feel that the Zoloft is agreeing with her and is trying to get weaned back off of it.  She says she does not want to take a bunch of medications and cannot sleep with the Zoloft.  She does not feel that depression is the etiology of her weight loss. She says she just has no appetite.  Suggested she try protein supplements etc. to add extra calories.  She is concerned about all of the ingredients in protein shakes with sugar etc. which may work against her cancer.  She continues to smoke. More acutely she has been having right lower quadrant pain over the past couple of days which she says is worse today this is been sharp and  jabbing in nature.  She has not had any fever or chills, no dysuria, no diarrhea or changes in bowel habits.  She says she has had problems with constipation recently as well, she had been off of MiraLAX and was having very hard stools which were hard to pass it seems to be better back on MiraLAX.  She occasionally will notice small amount of bright red blood just on the tissue. EGD was done in May 2017 finding the distal esophageal stricture which was balloon dilated and colonoscopy done May 2017 with removal of 2 small sessile polyps which were adenomatous.  Review of Systems Pertinent positive and negative review of systems were noted in the above HPI section.  All other review of systems was otherwise negative.  Outpatient Encounter Medications as of 05/02/2017  Medication Sig  . ALPRAZolam (XANAX) 1 MG tablet Take 1 mg by mouth 3 (three) times daily as needed for anxiety.  . Calcium Carbonate-Vitamin D (CALCIUM 600+D) 600-400 MG-UNIT tablet Take 1 tablet by mouth daily.  . clopidogrel (PLAVIX) 75 MG tablet TAKE 1 TABLET(75 MG) BY MOUTH DAILY  . clotrimazole (MYCELEX) 10 MG troche Take 1 tablet (10 mg total) by mouth 4 (four) times daily as needed.  . Coenzyme Q10 (COQ10) 100 MG CAPS Take 100 mg by mouth daily.   [name] Ethyl (VASCEPA) 1 g CAPS Take 1 g by mouth daily.   . isosorbide mononitrate (  IMDUR) 30 MG 24 hr tablet TAKE 1 TABLET(30 MG) BY MOUTH AT BEDTIME  . levalbuterol (XOPENEX HFA) 45 MCG/ACT inhaler Inhale 1-2 puffs into the lungs every 6 (six) hours as needed for wheezing.  . metoprolol succinate (TOPROL XL) 25 MG 24 hr tablet Take 0.5 tablets (12.5 mg total) by mouth daily with supper.  . neomycin-polymyxin b-dexamethasone (MAXITROL) 3.5-10000-0.1 SUSP SHAKE LQ AND INT 1 GTT IN OU TID  . nitroGLYCERIN (NITROSTAT) 0.4 MG SL tablet DISSOLVE 1 TABLET UNDER THE TONGUE EVERY 5 MINUTES AS NEEDED FOR CHEST PAIN  . pantoprazole (PROTONIX) 40 MG tablet Take 40 mg by mouth daily.  .  polyethylene glycol (MIRALAX / GLYCOLAX) packet Take 17 g by mouth daily. (Patient taking differently: Take 17 g by mouth daily as needed for mild constipation. )  . pravastatin (PRAVACHOL) 40 MG tablet TAKE 1 TABLET(40 MG) BY MOUTH EVERY EVENING  . senna-docusate (SENOKOT-S) 8.6-50 MG tablet Take 1 tablet by mouth 2 (two) times daily as needed for mild constipation.  . sertraline (ZOLOFT) 20 MG/ML concentrated solution Take 50 mg daily by mouth.  . tiotropium (SPIRIVA) 18 MCG inhalation capsule Place 18 mcg into inhaler and inhale daily.  [name] nystatin (MYCOSTATIN) 100000 UNIT/ML suspension Take 5 ml's by mouth  4 times daily, swish and spit.  . [DISCONTINUED] albuterol (PROVENTIL HFA;VENTOLIN HFA) 108 (90 Base) MCG/ACT inhaler Inhale 1-2 puffs into the lungs every 6 (six) hours as needed for wheezing or shortness of breath.  . [DISCONTINUED] chlorpheniramine-HYDROcodone (TUSSIONEX PENNKINETIC ER) 10-8 MG/5ML SUER Take 5 mLs by mouth at bedtime as needed for cough. (Patient not taking: Reported on 05/02/2017)  . [DISCONTINUED] nystatin (MYCOSTATIN) 100000 UNIT/ML suspension Take 5 mLs (500,000 Units total) by mouth 4 (four) times daily. Three to four times daily x14 days. (Patient not taking: Reported on 05/02/2017)   Facility-Administered Encounter Medications as of 05/02/2017  Medication  . HYDROcodone-acetaminophen (NORCO/VICODIN) 5-325 MG per tablet 1 tablet   Allergies  Allergen Reactions  . Ciprofloxacin Other (See Comments)    Reaction:  Hypoglycemia   . Aspirin Other (See Comments)    Reaction:  GI upset   Pt is only able to take enteric coated Aspirin.    . Crestor [Rosuvastatin] Other (See Comments)    Reaction:  Muscle pain   . Ibuprofen Other (See Comments)    Reaction:  GI upset   . Wellbutrin [Bupropion] Palpitations  . Albuterol Palpitations  . Lipitor [Atorvastatin] Other (See Comments)    Reaction:  Muscle pain   . Sulfonamide Derivatives Itching and Rash   Patient  Active Problem List   Diagnosis Date Noted  . Thrush 02/27/2017  . Cough 02/20/2017  . COPD without exacerbation ([place]) 12/12/2016  . Dysphagia 12/12/2016  . Dizziness of unknown cause 11/20/2016  . PMB (postmenopausal bleeding) 06/17/2016  . Superficial venous thrombosis of right upper extremity 05/22/2016  . Extravasation injury 04/18/2016  . Constipation 03/29/2016  . Cancer associated pain 03/29/2016  . Dehydration 03/29/2016  . Encounter for antineoplastic chemotherapy 03/12/2016  . 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 Small cell carcinoma of right lung ([place]) 02/26/2016  . Stable angina ([place]) 02/25/2015  . Stenosis of coronary stent 02/25/2015  . Abdominal pain in female patient 11/24/2014  . Chronic fatigue and malaise 04/10/2014  . Heart palpitations 11/28/2013  . Essential hypertension 05/30/2013  . Coronary artery spasm, hx of 04/30/2013  . Acrocyanosis ([place]) 01/01/2013  . Tobacco abuse, ongoing   . Edema of  upper extremity 11/30/2012  . Lipoma of lower extremity 10/23/2012  . CAD -S/P MI-PCI AVG 5/13 then staged DES to RCA  11/06/11. ISR- PCI 11/19/12, cath 02/18/13- no ISR, + spasm, Myoview low risk Nov 2014 10/10/2011    Class: Diagnosis of  . History ST elevation myocardial infarction (STEMI) of inferolateral wall 10/02/2011  . Osteopenia 07/30/2011  . Leg pain, anterior, left 08/24/2010  . Palpitations 11/28/2009  . HERPES SIMPLEX INFECTION 06/28/2009  . VITAMIN D DEFICIENCY 11/28/2008  . INSOMNIA 10/01/2007  . HYPERGLYCEMIA 08/30/2007  . Dyslipidemia, goal LDL below 70 08/10/2007  . COPD (chronic obstructive pulmonary disease) ([place]) 12/23/2006  . Anxiety state 06/11/2006    Class: Diagnosis of  . Depression 06/11/2006  . RESTLESS LEG SYNDROME 06/11/2006  . GERD 06/11/2006  . KNEE PAIN, CHRONIC 06/11/2006  . SPONDYLOSIS, CERVICAL, WITH RADICULOPATHY 06/11/2006  . LOW BACK PAIN 06/11/2006  . SEIZURE DISORDER 06/11/2006    Class:  Diagnosis of   Social History   Socioeconomic History  . Marital status: Significant Other    Spouse name: Not on file  . Number of children: 5  . Years of education: Not on file  . Highest education level: Not on file  Social Needs  . Financial resource strain: Not on file  . Food insecurity - worry: Not on file  . Food insecurity - inability: Not on file  . Transportation needs - medical: Not on file  . Transportation needs - non-medical: Not on file  Occupational History  . Occupation: Disabled     Employer: DISABLED  Tobacco Use  . Smoking status: Former Smoker    Packs/day: 1.50    Years: 40.00    Pack years: 60.00    Types: Cigarettes    Last attempt to quit: 02/02/2016    Years since quitting: 1.2  . Smokeless tobacco: Never Used  . Tobacco comment: 04/15/12 "I quit once for 2 1/2 years; smoking cessation counselor already here to visit"; done to less than 1/2 ppd (03/02/2013) - "1 pack per week" - 05/24/13  Substance and Sexual Activity  . Alcohol use: No    Alcohol/week: 0.0 oz  . Drug use: No  . Sexual activity: Not Currently    Birth control/protection: Post-menopausal  Other Topics Concern  . Not on file  Social History Narrative   Divorced mother of 84 and a grandmother 77, great-grandmother of 1    On disability, previously worked as a Educational psychologist.  Quit smoking 06/2007 but restarted 1/11 -- smoking a pack a day.  -- now a pack lasts a week.   Does not drink alcohol.   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   0 Caffeine drinks daily     Ms. Dieter's family history includes Asthma in her mother; Cancer in her maternal grandmother; Colon polyps in her mother; Diabetes in her mother; Emphysema in her mother; Heart attack in her maternal grandfather; Heart disease in her father and mother; Hyperlipidemia in her mother; Hypertension in her mother; Kidney cancer in her brother; Stomach cancer in her brother and brother; Stroke in her  mother; Stroke (age of onset: 55) in her brother.      Objective:    Vitals:   05/02/17 1024  BP: 118/60  Pulse: 62  SpO2: 94%    Physical Exam Well-developed thin white female in no acute distress, blood pressure 118/60, pulse 62, weight 116 down 3 pounds from August 2018, BMI 18.4.  HEENT; nontraumatic  normocephalic EOMI PERRLA sclerae anicteric, She has thrush on her tongue Cardiovascular ;regular rate and rhythm with S1-S2 no murmur rub or gallop, Pulmonary ;somewhat decreased breath sounds bilaterally, patient smells of tobacco, Abdomen ;soft, she is tender in the right lower quadrant there is no guarding or rebound no palpable mass or hepatosplenomegaly bowel sounds are present, Rectal; exam not done, Extremities ;no clubbing cyanosis or edema skin warm and dry, Neuro; psych mood and affect appropriate       Assessment & Plan:   #42 61 year old white female with history of small cell lung cancer diagnosed September 2017 status post chemotherapy and radiation. She comes in today with continued weight loss and anorexia. Last PET scan in September 2018 showed no evidence of recurrence of malignancy. Patient had recently been started on Zoloft and Xanax for depression and anxiety.  She is trying to wean off of both of these and does not feel that depression is the etiology of her weight loss.  #2 acute right lower quadrant pain over the past several days somewhat progressive Etiology not clear #3 COPD 4.  Hypertension 5.  Coronary artery disease status post MI and stents 6.  Chronic antiplatelet therapy-on Plavix 7.  History of coronary spasm 8.  History of esophageal stricture status post dilation 2017 9 History of adenomatous colon polyps up-to-date with colonoscopy 10.  Oral thrush  Plan; CBC with differential, C met, TSH, UA Schedule for CT of the abdomen and pelvis with contrast Start Mycostatin oral suspension 5 cc p.o. 4 times daily swish and spit times 2 weeks Patient  encouraged to find a high protein low sugar protein supplement and try to get in at least 2/day between meals to increase caloric intake. Further plans pending results of above.  Amy S Esterwood PA-C 05/02/2017   Cc: Scifres, Earlie Server, PA-C

## 2017-05-06 ENCOUNTER — Ambulatory Visit (HOSPITAL_COMMUNITY): Admission: RE | Admit: 2017-05-06 | Payer: Medicare Other | Source: Ambulatory Visit

## 2017-05-06 ENCOUNTER — Ambulatory Visit (HOSPITAL_COMMUNITY)
Admission: RE | Admit: 2017-05-06 | Discharge: 2017-05-06 | Disposition: A | Discharge status: Home / Self Care | Payer: Medicare Other | Source: Ambulatory Visit | Attending: Physician Assistant | Admitting: Physician Assistant

## 2017-05-06 ENCOUNTER — Encounter (HOSPITAL_COMMUNITY): Payer: Self-pay

## 2017-05-06 ENCOUNTER — Ambulatory Visit
Admission: RE | Admit: 2017-05-06 | Discharge: 2017-05-06 | Disposition: A | Discharge status: Home / Self Care | Payer: Medicare Other | Source: Ambulatory Visit | Attending: Physician Assistant | Admitting: Physician Assistant

## 2017-05-06 ENCOUNTER — Other Ambulatory Visit: Payer: Self-pay | Admitting: Physician Assistant

## 2017-05-06 DIAGNOSIS — R634 Abnormal weight loss: Secondary | ICD-10-CM

## 2017-05-06 DIAGNOSIS — R1031 Right lower quadrant pain: Secondary | ICD-10-CM

## 2017-05-06 DIAGNOSIS — J432 Centrilobular emphysema: Secondary | ICD-10-CM | POA: Insufficient documentation

## 2017-05-06 DIAGNOSIS — R5383 Other fatigue: Secondary | ICD-10-CM

## 2017-05-06 DIAGNOSIS — I700 Atherosclerosis of aorta: Secondary | ICD-10-CM | POA: Insufficient documentation

## 2017-05-06 DIAGNOSIS — K449 Diaphragmatic hernia without obstruction or gangrene: Secondary | ICD-10-CM | POA: Insufficient documentation

## 2017-05-06 DIAGNOSIS — Z85118 Personal history of other malignant neoplasm of bronchus and lung: Secondary | ICD-10-CM

## 2017-05-06 DIAGNOSIS — I2510 Atherosclerotic heart disease of native coronary artery without angina pectoris: Secondary | ICD-10-CM | POA: Diagnosis not present

## 2017-05-06 MED ORDER — IOPAMIDOL (ISOVUE-300) INJECTION 61%
100.0000 mL | Freq: Once | INTRAVENOUS | Status: AC | PRN
  Administered 2017-05-06: 100 mL via INTRAVENOUS

## 2017-05-08 ENCOUNTER — Telehealth: Payer: Self-pay | Admitting: Physician Assistant

## 2017-05-08 NOTE — Telephone Encounter (Signed)
Patient advised the CT will be reviewed by Dr Ardis Hughs. No findings that explain the pain.

## 2017-05-15 ENCOUNTER — Ambulatory Visit
Admission: RE | Admit: 2017-05-15 | Discharge: 2017-05-15 | Disposition: A | Discharge status: Home / Self Care | Payer: Medicare Other | Source: Ambulatory Visit | Attending: Radiation Oncology | Admitting: Radiation Oncology

## 2017-05-15 DIAGNOSIS — R51 Headache: Secondary | ICD-10-CM | POA: Diagnosis not present

## 2017-05-15 DIAGNOSIS — C7949 Secondary malignant neoplasm of other parts of nervous system: Secondary | ICD-10-CM

## 2017-05-15 DIAGNOSIS — C7931 Secondary malignant neoplasm of brain: Secondary | ICD-10-CM

## 2017-05-15 MED ORDER — GADOBENATE DIMEGLUMINE 529 MG/ML IV SOLN
10.0000 mL | Freq: Once | INTRAVENOUS | Status: AC | PRN
  Administered 2017-05-15: 10 mL via INTRAVENOUS

## 2017-05-16 ENCOUNTER — Telehealth: Payer: Self-pay | Admitting: Physician Assistant

## 2017-05-16 IMAGING — NM NM MYOCAR MULTI W/ SPECT
3 series · 18 of 18 positions shown · non-contrast
Comparison: none

[Series 1: rest · 6.51mm/px · 6 of 64 frames shown]
[frame 6/64]
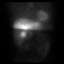
[frame 16/64]
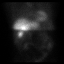
[frame 27/64]
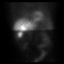
[frame 38/64]
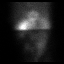
[frame 48/64]
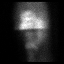
[frame 59/64]
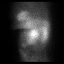

[Series 2: stress · 6.51mm/px · 6 of 64 frames shown (1 of 2)]
[frame 6/64]
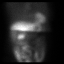
[frame 16/64]
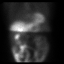
[frame 27/64]
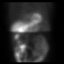
[frame 38/64]
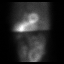
[frame 48/64]
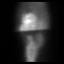
[frame 59/64]
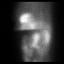

[Series 2: stress · 6.51mm/px · 6 of 512 frames shown (2 of 2)]
[frame 43/512]
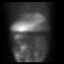
[frame 128/512]
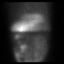
[frame 214/512]
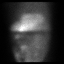
[frame 299/512]
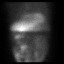
[frame 384/512]
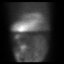
[frame 470/512]
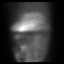

[18 of 18 positions shown; findings below may reference images not displayed]

Canned report from images found in remote index.

Refer to host system for actual result text.

## 2017-05-16 NOTE — Telephone Encounter (Signed)
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 She has a lot of questions. "Cannot accept that this is okay" and requests the results be sent to her cardiologist. Per her request the CT abd/pelvis has been faxed to Dr [name].

## 2017-05-17 ENCOUNTER — Telehealth: Payer: Self-pay | Admitting: Radiation Oncology

## 2017-05-17 NOTE — Telephone Encounter (Signed)
Diagnosis and Prior Radiotherapy:    ICD-10-CM   1. 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  I reviewed the report with her and let her know it  was reassuring.  Mr Jeri Cos Wo Contrast  Result Date: 05/15/2017 CLINICAL DATA:  Small-cell lung cancer. Whole-brain radiation therapy 06/25/2016-07/08/2016. Headaches, imbalance, and weight loss. Creatinine was obtained on site at [facility] at [address] Results: Creatinine 0.6 mg/dL. EXAM: MRI HEAD WITHOUT AND WITH CONTRAST TECHNIQUE: Multiplanar, multiecho pulse sequences of the brain and surrounding structures were obtained without and with intravenous contrast. CONTRAST:  65mL MULTIHANCE GADOBENATE DIMEGLUMINE 529 MG/ML IV SOLN COMPARISON:  01/16/2017 FINDINGS: Brain: There is no evidence of acute infarct, intracranial hemorrhage, mass, midline shift, or extra-axial fluid collection. Scattered punctate foci of cerebral white matter T2 hyperintensity are unchanged and nonspecific but compatible with minimal chronic small vessel ischemic disease. The ventricles and sulci are within normal limits for age. No abnormal enhancement is identified. 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 Vascular: Major intracranial vascular flow voids are preserved. Skull and upper cervical spine: Unremarkable marrow signal. Sinuses/Orbits: Unremarkable orbits. Trace left mastoid effusion, decreased from prior. Clear paranasal sinuses. Other: None. IMPRESSION: No evidence of intracranial metastases or acute  abnormality. Electronically Signed   By: Logan Bores M.D.   On: 05/15/2017 15:04    She has no new symptoms but cont'd unexplained HAs, unsteadiness, anxiety, depression.  Reports intolerance to Zoloft, weaned herself off. Still having trouble with maintaining weight but declines seeing a nutritionist again.  She has many appts with physicians.  Given that she has no new sx, but cont'd chronic neurologic complaints that I've not been able to explain, she and I agreed to cancel her f/u with me next week and to have a consultation with Dr Mickeal Skinner of neurology.  I will ask Mont Dutton RT to cancel her appt with me, refer to Dr Mickeal Skinner for the above (HAs, unsteadiness, anxiety/depression).    I will see her again in 4 months after repeat MRI of brain. Mont Dutton to arrange). -----------------------------------  [name], MD

## 2017-05-19 ENCOUNTER — Other Ambulatory Visit: Payer: Self-pay | Admitting: Radiation Therapy

## 2017-05-19 DIAGNOSIS — C7949 Secondary malignant neoplasm of other parts of nervous system: Secondary | ICD-10-CM

## 2017-05-19 DIAGNOSIS — C7931 Secondary malignant neoplasm of brain: Secondary | ICD-10-CM

## 2017-05-20 ENCOUNTER — Ambulatory Visit: Payer: Self-pay | Admitting: Radiation Oncology

## 2017-05-21 ENCOUNTER — Inpatient Hospital Stay: Admission: RE | Admit: 2017-05-21 | Payer: Self-pay | Source: Ambulatory Visit | Admitting: Radiation Oncology

## 2017-05-23 ENCOUNTER — Telehealth: Payer: Self-pay | Admitting: Physician Assistant

## 2017-05-23 NOTE — Telephone Encounter (Signed)
Patient very confused and not quite sure what she is needing. States that Two people called her with results from CT done on 12.4.18 talking about blood clots. Patient states APP Amy was going to fax results to heart doctor but pt has not heard from them and wants to know if Amy wanted her seen by them. Not quite sure what else patient was needing.

## 2017-05-23 NOTE — Telephone Encounter (Signed)
Patient asking if the CT results from 05/06/17 could be sent to her cardiologist Dr. [name] to determine if anything needs to be done about mural thrombus. Patient advised that I will forward this at her request.

## 2017-05-24 NOTE — Telephone Encounter (Signed)
I have asked the Vascular surgeons to review the CT scan. Waiting to hear what they say.  Usually, with these types of findings, they are not overly concerned.  However since this is not my field of expertise, I am asking for assistance.  Glenetta Hew, MD

## 2017-06-02 ENCOUNTER — Encounter: Payer: Self-pay | Admitting: Internal Medicine

## 2017-06-02 NOTE — Telephone Encounter (Signed)
I called Michele [name] with this information. Read Dr Allison Quarry note to her. She says she continues to have abdominal pain and cannot continue to go through this. Wants to know what we are going to do about this.

## 2017-06-02 NOTE — Telephone Encounter (Signed)
I clarify this with vascular surgery.  This is not a worrisome finding.  It would not be causing abdominal pain.  Glenetta Hew, MD

## 2017-06-02 NOTE — Telephone Encounter (Signed)
Please let pt know  I had messaged her Cardiologist  Dr [name]  Before Christmas - he said he was getting advice form vascular surgery , but didn't think they would be too concerned .

## 2017-06-02 NOTE — Telephone Encounter (Signed)
Her abdominal pain is not coming from this finding.  Glenetta Hew, MD

## 2017-06-04 NOTE — Telephone Encounter (Signed)
She wants to know what is causing her pain.

## 2017-06-04 NOTE — Telephone Encounter (Signed)
Yes, I am seeing- please call pt and let her know Dr [name] discussed with Vascular surgery. She does not need to have anything done about this thrombus, not felt to be worrisome and should not cause abdominal  pain or weight  Loss. She is on Plavix for other vascular issues.

## 2017-06-04 NOTE — Telephone Encounter (Signed)
Amy are you getting this?

## 2017-06-05 ENCOUNTER — Telehealth: Payer: Self-pay | Admitting: Cardiology

## 2017-06-05 DIAGNOSIS — M79604 Pain in right leg: Secondary | ICD-10-CM

## 2017-06-05 DIAGNOSIS — R600 Localized edema: Secondary | ICD-10-CM

## 2017-06-05 DIAGNOSIS — M79605 Pain in left leg: Secondary | ICD-10-CM

## 2017-06-05 NOTE — Telephone Encounter (Signed)
Spoke to patient.  She aware awaiting answer.   patient states she knows her body - "I have so much going on " I think something wrong with my legs, back pain,  "Blood clot " , and aneurysm.  I think I need to be checked out.  RN offered patient an appointment with extender or can await and discuss with Dr [name] next week.  patient  States she would like to wait on dr harding opinion.  RN INFORMED PATIENT WILL CONTACT HER AS SOON AS INFORMATION IS OBTAIN @LATE  Monday 06/09/17 OR Tuesday.  PATIENT VERBALIZED UNDERSTANDING.

## 2017-06-05 NOTE — Telephone Encounter (Signed)
New Message  Pt call requesting to speak with RN. Pt states some information was faxed to Dr. [name] about pt having a finding of a blood clot under her kidney. Pt states she has not heard anything back from Dr. [name] or RN. Please call back to discuss

## 2017-06-05 NOTE — Telephone Encounter (Signed)
No explanation for her pain on CT, up to date with Colonoscopy, pain may be IBS related or musculoskeletal. Can offer levsin  One q 6 hours prn  To see if helps and please get her a follow up appt with Dr  Ardis Hughs

## 2017-06-05 NOTE — Telephone Encounter (Signed)
Explained this to the patient. Offered Levsin. She again argues with me about the CT scan being clear. Declines Levsin.  Wants me to call her again in 30 minutes and make her an appointment. She declines to call back when it is more convenient.

## 2017-06-06 ENCOUNTER — Ambulatory Visit: Payer: Self-pay | Admitting: Gastroenterology

## 2017-06-06 NOTE — Telephone Encounter (Signed)
Just so you know - [facility] is very leery of changing any existing or taking any new meds .  Glenetta Hew, MD

## 2017-06-09 NOTE — Telephone Encounter (Signed)
-----   Message from Leonie Man, MD sent at 06/02/2017  3:49 PM EST ----- Regarding: AAA with mural thrombus I discussed the finding of mural thrombus in the setting of a some small aneurysm with Dr. Donnetta Hutching from vascular surgery.  He says this is not something they would.  Infected is a reassuring finding usually.  It would not be causing abdominal symptoms.   Glenetta Hew, MD  Ivin Booty, this is been an issue ongoing for a while.  I am not sure who sent the initial note.  Want to finalize.

## 2017-06-09 NOTE — Telephone Encounter (Signed)
Looks like patient is scheduled to follow up with Michele E. on 06/17/17, please see message from Dr. [name].

## 2017-06-09 NOTE — Telephone Encounter (Signed)
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   CAN SCHEDULE-- LEV DOPPLER FOR DVT - LEG PAIN BILATERALLY VERBAL ORDER FROM DR HARDING.  PATIENT AWARE AND 6 MONTH APPOINTMENT SCHEDULE FOR 08/2017

## 2017-06-12 ENCOUNTER — Encounter: Payer: Self-pay | Admitting: Internal Medicine

## 2017-06-12 ENCOUNTER — Inpatient Hospital Stay: Payer: Medicare Other | Attending: Internal Medicine | Admitting: Internal Medicine

## 2017-06-12 DIAGNOSIS — R413 Other amnesia: Secondary | ICD-10-CM

## 2017-06-12 DIAGNOSIS — Z87891 Personal history of nicotine dependence: Secondary | ICD-10-CM | POA: Diagnosis not present

## 2017-06-12 DIAGNOSIS — G4700 Insomnia, unspecified: Secondary | ICD-10-CM | POA: Diagnosis not present

## 2017-06-12 DIAGNOSIS — C3490 Malignant neoplasm of unspecified part of unspecified bronchus or lung: Secondary | ICD-10-CM | POA: Insufficient documentation

## 2017-06-12 DIAGNOSIS — G44219 Episodic tension-type headache, not intractable: Secondary | ICD-10-CM | POA: Diagnosis not present
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
## 2017-06-13 ENCOUNTER — Ambulatory Visit (HOSPITAL_COMMUNITY)
Admission: RE | Admit: 2017-06-13 | Discharge: 2017-06-13 | Disposition: A | Discharge status: Home / Self Care | Payer: Medicare Other | Source: Ambulatory Visit | Attending: Cardiovascular Disease | Admitting: Cardiovascular Disease

## 2017-06-13 DIAGNOSIS — M79605 Pain in left leg: Secondary | ICD-10-CM

## 2017-06-13 DIAGNOSIS — R600 Localized edema: Secondary | ICD-10-CM | POA: Diagnosis not present

## 2017-06-13 DIAGNOSIS — M79604 Pain in right leg: Secondary | ICD-10-CM | POA: Diagnosis not present

## 2017-06-17 ENCOUNTER — Telehealth: Payer: Self-pay | Admitting: Physician Assistant

## 2017-06-17 ENCOUNTER — Encounter: Payer: Self-pay | Admitting: Physician Assistant

## 2017-06-17 ENCOUNTER — Ambulatory Visit (INDEPENDENT_AMBULATORY_CARE_PROVIDER_SITE_OTHER): Payer: Medicare Other | Admitting: Physician Assistant

## 2017-06-17 ENCOUNTER — Other Ambulatory Visit (INDEPENDENT_AMBULATORY_CARE_PROVIDER_SITE_OTHER): Payer: Medicare Other

## 2017-06-17 VITALS — BP 104/62 | HR 74 | Ht 65.0 in | Wt 116.0 lb

## 2017-06-17 DIAGNOSIS — R300 Dysuria: Secondary | ICD-10-CM | POA: Diagnosis not present

## 2017-06-17 DIAGNOSIS — R1032 Left lower quadrant pain: Secondary | ICD-10-CM

## 2017-06-17 DIAGNOSIS — Z85118 Personal history of other malignant neoplasm of bronchus and lung: Secondary | ICD-10-CM

## 2017-06-17 DIAGNOSIS — K644 Residual hemorrhoidal skin tags: Secondary | ICD-10-CM

## 2017-06-17 DIAGNOSIS — Z860100 Personal history of colon polyps, unspecified: Secondary | ICD-10-CM

## 2017-06-17 DIAGNOSIS — K219 Gastro-esophageal reflux disease without esophagitis: Secondary | ICD-10-CM | POA: Diagnosis not present

## 2017-06-17 DIAGNOSIS — R1031 Right lower quadrant pain: Secondary | ICD-10-CM

## 2017-06-17 LAB — CBC WITH DIFFERENTIAL/PLATELET
Basophils Absolute: 0 10*3/uL (ref 0.0–0.1)
Basophils Relative: 0.4 % (ref 0.0–3.0)
Eosinophils Absolute: 0 10*3/uL (ref 0.0–0.7)
Eosinophils Relative: 0.9 % (ref 0.0–5.0)
HCT: 43.6 % (ref 36.0–46.0)
Hemoglobin: 14.3 g/dL (ref 12.0–15.0)
Lymphocytes Relative: 10.8 % — ABNORMAL LOW (ref 12.0–46.0)
Lymphs Abs: 0.6 10*3/uL — ABNORMAL LOW (ref 0.7–4.0)
MCHC: 32.8 g/dL (ref 30.0–36.0)
MCV: 97.2 fl (ref 78.0–100.0)
Monocytes Absolute: 0.3 10*3/uL (ref 0.1–1.0)
Monocytes Relative: 5.7 % (ref 3.0–12.0)
Neutro Abs: 4.3 10*3/uL (ref 1.4–7.7)
Neutrophils Relative %: 82.2 % — ABNORMAL HIGH (ref 43.0–77.0)
Platelets: 164 10*3/uL (ref 150.0–400.0)
RBC: 4.49 Mil/uL (ref 3.87–5.11)
RDW: 13.8 % (ref 11.5–15.5)
WBC: 5.2 10*3/uL (ref 4.0–10.5)

## 2017-06-17 LAB — URINALYSIS, ROUTINE W REFLEX MICROSCOPIC
Bilirubin Urine: NEGATIVE
Hgb urine dipstick: NEGATIVE
Ketones, ur: NEGATIVE
Leukocytes, UA: NEGATIVE
Nitrite: NEGATIVE
RBC / HPF: NONE SEEN (ref 0–?)
Specific Gravity, Urine: <=1.005 — AB (ref 1.000–1.030)
Total Protein, Urine: NEGATIVE
Urine Glucose: NEGATIVE
Urobilinogen, UA: 0.2 (ref 0.0–1.0)
WBC, UA: NONE SEEN (ref 0–?)
pH: 6 (ref 5.0–8.0)

## 2017-06-17 MED ORDER — HYDROCORTISONE ACETATE 25 MG RE SUPP
RECTAL | 2 refills | Status: DC
Start: 2017-06-17 — End: 2017-08-12

## 2017-06-17 MED ORDER — HYDROCORTISONE 2.5 % RE CREA: TOPICAL_CREAM | RECTAL | 1 refills | Status: DC

## 2017-06-17 NOTE — Patient Instructions (Addendum)
Please go to the basement level to have your labs drawn and Urine test.  You can get over the counter Recticare 5 % Lidocaine at your . Pharmacy or [facility].  Apply to the hemorrhoid area 3 times daily as needed.   We sent presctriptions to [facility]. 1. Anusol 2.5 % cream 2. Anusol HC Suppositories  If the insurance doesn't want to cover the Anusol products, you can use Preperation H cream and suppositories.   Over the counter Recticare 5 % Lidocaine.   We will call you with an appointment with Dr. Inda Merlin.

## 2017-06-17 NOTE — Telephone Encounter (Signed)
Patient states she forgot to ask Nicoletta Ba today if she could get Dr.Mohamed to order a bone scan when she speaks with that office about getting a sooner appt than April.

## 2017-06-17 NOTE — Telephone Encounter (Signed)
Do you want her to have a bone scan?

## 2017-06-17 NOTE — Progress Notes (Signed)
Subjective:    Patient ID: Michele [name], female    DOB: 10/23/1955, 62 y.o.   MRN: [id number]  HPI Michele [name] is a pleasant 62 year old white female, known to Dr. Ardis [name] and myself.  She was last seen in the office in November 2018 at that time with concerns about weight loss and right lower quadrant abdominal pain.  She had lost 3 or 4 pounds over the preceding few months.  Patient was diagnosed with T2 N2 M0 small cell lung cancer in 2017 with right mediastinal adenopathy.  She underwent chemotherapy, radiation and prophylactic cranial radiation under the care of Michele [name].  She also has history of coronary artery disease status post MI and multiple PCI's.  She is maintained on Plavix.  She has history of coronary spasm, hypertension, and COPD.  She also has GERD with prior stricture which required dilation. 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  There is no evidence of adenopathy in the abdomen or other abnormality. Last colonoscopy and endoscopy were done in May 2017.  She did have 2 polyps removed which were sessile serrated polyps and she is indicated for 5-year interval follow-up.  EGD was negative with the exception of the stricture which was dilated. The nonobstructive mural thrombus was discussed with Dr. Ellyn [name] who also discussed with vascular surgery, and consensus was that this was not a worrisome finding and that she did not need any further workup for this.  It was also felt that should not be causing her any symptoms. She comes back in today very worried as usual and scared that she has recurrence of cancer.  She says she feels poorly in general has been having a lot of low back pain, she continues to have pain in her right side intermittently.  She says it may hurt for a few days and go away but always recurs.   Her bowel movements have been somewhat constipated.  She says she is been noticing small amounts of bright red blood on the tissue with wiping and has had some anal discomfort.  Her weight is actually stable. He also complains of dysuria and wonders if she has a UTI. She is not supposed to follow-up with Michele [name] until April 2019.  Review of Systems; Pertinent positive and negative review of systems were noted in the above HPI section.  All other review of systems was otherwise negative.  Outpatient Encounter Medications as of 06/17/2017  Medication Sig  . ALPRAZolam (XANAX) 1 MG tablet Take 1 mg by mouth 3 (three) times daily as needed for anxiety.  . Calcium Carbonate-Vitamin D (CALCIUM 600+D) 600-400 MG-UNIT tablet Take 1 tablet by mouth daily.  . clopidogrel (PLAVIX) 75 MG tablet TAKE 1 TABLET(75 MG) BY MOUTH DAILY  . clotrimazole (MYCELEX) 10 MG troche Take 1 tablet (10 mg total) by mouth 4 (four) times daily as needed.  . Coenzyme Q10 (COQ10) 100 MG CAPS Take 100 mg by mouth daily.   . isosorbide mononitrate (IMDUR) 30 MG 24 hr tablet TAKE 1 TABLET(30 MG) BY MOUTH AT BEDTIME  . levalbuterol (XOPENEX HFA) 45 MCG/ACT inhaler Inhale 1-2 puffs into the lungs every 6 (six) hours as needed for wheezing.  . metoprolol succinate (TOPROL XL) 25 MG 24 hr tablet Take 0.5 tablets (12.5 mg total) by mouth daily with supper.  [name]  nitroGLYCERIN (NITROSTAT) 0.4 MG SL tablet DISSOLVE 1 TABLET UNDER THE TONGUE EVERY 5 MINUTES AS NEEDED FOR CHEST PAIN  . Omega-3 Fatty Acids (FISH OIL) 1000 MG CAPS Take by mouth daily.  . pantoprazole (PROTONIX) 40 MG tablet Take 40 mg by mouth daily.  . polyethylene glycol (MIRALAX / GLYCOLAX) packet Take 17 g by mouth daily. (Patient taking differently: Take 17 g by mouth daily as needed for mild constipation. )  . pravastatin (PRAVACHOL) 40 MG tablet TAKE 1 TABLET(40 MG) BY MOUTH EVERY EVENING  . sertraline (ZOLOFT) 20 MG/ML concentrated solution Take 50 mg daily by  mouth.  . tiotropium (SPIRIVA) 18 MCG inhalation capsule Place 18 mcg into inhaler and inhale daily.  . [DISCONTINUED] Icosapent Ethyl (VASCEPA) 1 g CAPS Take 1 g by mouth daily.   . [DISCONTINUED] neomycin-polymyxin b-dexamethasone (MAXITROL) 3.5-10000-0.1 SUSP SHAKE LQ AND INT 1 GTT IN OU TID  . [DISCONTINUED] nystatin (MYCOSTATIN) 100000 UNIT/ML suspension Take 5 ml's by mouth  4 times daily, swish and spit.  . [DISCONTINUED] senna-docusate (SENOKOT-S) 8.6-50 MG tablet Take 1 tablet by mouth 2 (two) times daily as needed for mild constipation.  . hydrocortisone (ANUSOL-HC) 2.5 % rectal cream Apply to hemorrhoid area 3-4 times daily.  . hydrocortisone (ANUSOL-HC) 25 MG suppository Use 1 suppository at bedtime for 10 days then repeat as needed.   Facility-Administered Encounter Medications as of 06/17/2017  Medication  . HYDROcodone-acetaminophen (NORCO/VICODIN) 5-325 MG per tablet 1 tablet   Allergies  Allergen Reactions  . Ciprofloxacin Other (See Comments)    Reaction:  Hypoglycemia   . Aspirin Other (See Comments)    Reaction:  GI upset   Pt is only able to take enteric coated Aspirin.    . Crestor [Rosuvastatin] Other (See Comments)    Reaction:  Muscle pain   . Ibuprofen Other (See Comments)    Reaction:  GI upset   . Wellbutrin [Bupropion] Palpitations  . Albuterol Palpitations  . Lipitor [Atorvastatin] Other (See Comments)    Reaction:  Muscle pain   . Sulfonamide Derivatives Itching and Rash   Patient Active Problem List   Diagnosis Date Noted  . Headache, frequent episodic tension-type 06/12/2017  . Thrush 02/27/2017  . Cough 02/20/2017  . COPD without exacerbation ([place]) 12/12/2016  . Dysphagia 12/12/2016  . Dizziness of unknown cause 11/20/2016  . PMB (postmenopausal bleeding) 06/17/2016  . Superficial venous thrombosis of right upper extremity 05/22/2016  . Extravasation injury 04/18/2016  . Constipation 03/29/2016  . Cancer associated pain 03/29/2016  .  Dehydration 03/29/2016  . Encounter for antineoplastic chemotherapy 03/12/2016  . 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 Small cell carcinoma of right lung ([place]) 02/26/2016  . Stable angina ([place]) 02/25/2015  . Stenosis of coronary stent 02/25/2015  . Abdominal pain in female patient 11/24/2014  . Chronic fatigue and malaise 04/10/2014  . Heart palpitations 11/28/2013  . Essential hypertension 05/30/2013  . Coronary artery spasm, hx of 04/30/2013  . Acrocyanosis ([place]) 01/01/2013  . Tobacco abuse, ongoing   . Edema of upper extremity 11/30/2012  . Lipoma of lower extremity 10/23/2012  . CAD -S/P MI-PCI AVG 5/13 then staged DES to RCA  11/06/11. ISR- PCI 11/19/12, cath 02/18/13- no ISR, + spasm, Myoview low risk Nov 2014 10/10/2011    Class: Diagnosis of  . History ST elevation myocardial infarction (STEMI) of inferolateral wall 10/02/2011  . Osteopenia 07/30/2011  . Leg pain, anterior, left 08/24/2010  . Palpitations 11/28/2009  . HERPES SIMPLEX  INFECTION 06/28/2009  . VITAMIN D DEFICIENCY 11/28/2008  . INSOMNIA 10/01/2007  . HYPERGLYCEMIA 08/30/2007  . Dyslipidemia, goal LDL below 70 08/10/2007  . COPD (chronic obstructive pulmonary disease) ([place]) 12/23/2006  . Anxiety state 06/11/2006    Class: Diagnosis of  . Depression 06/11/2006  . RESTLESS LEG SYNDROME 06/11/2006  . GERD 06/11/2006  . KNEE PAIN, CHRONIC 06/11/2006  . SPONDYLOSIS, CERVICAL, WITH RADICULOPATHY 06/11/2006  . LOW BACK PAIN 06/11/2006  . SEIZURE DISORDER 06/11/2006    Class: Diagnosis of   Social History   Socioeconomic History  . Marital status: Significant Other    Spouse name: Not on file  . Number of children: 5  . Years of education: Not on file  . Highest education level: Not on file  Social Needs  . Financial resource strain: Not on file  . Food insecurity - worry: Not on file  . Food insecurity - inability: Not on file  . Transportation needs - medical: Not on file  .  Transportation needs - non-medical: Not on file  Occupational History  . Occupation: Disabled     Employer: DISABLED  Tobacco Use  . Smoking status: Former Smoker    Packs/day: 1.50    Years: 40.00    Pack years: 60.00    Types: Cigarettes    Last attempt to quit: 02/02/2016    Years since quitting: 1.3  . Smokeless tobacco: Never Used  . Tobacco comment: 04/15/12 "I quit once for 2 1/2 years; smoking cessation counselor already here to visit"; done to less than 1/2 ppd (03/02/2013) - "1 pack per week" - 05/24/13  Substance and Sexual Activity  . Alcohol use: No    Alcohol/week: 0.0 oz  . Drug use: No  . Sexual activity: Not Currently    Birth control/protection: Post-menopausal  Other Topics Concern  . Not on file  Social History Narrative   Divorced mother of 41 and a grandmother 36, great-grandmother of 1    On disability, previously worked as a Educational psychologist.  Quit smoking 06/2007 but restarted 1/11 -- smoking a pack a day.  -- now a pack lasts a week.   Does not drink alcohol.   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   0 Caffeine drinks daily     Ms. Dekker's family history includes Asthma in her mother; Cancer in her maternal grandmother; Colon polyps in her mother; Diabetes in her mother; Emphysema in her mother; Heart attack in her maternal grandfather; Heart disease in her father and mother; Hyperlipidemia in her mother; Hypertension in her mother; Kidney cancer in her brother; Stomach cancer in her brother and brother; Stroke in her mother; Stroke (age of onset: 62) in her brother.      Objective:    Vitals:   06/17/17 1012  BP: 104/62  Pulse: 74    Physical Exam well-developed older white female in no acute distress, pleasant blood pressure 104/62 pulse 74, height 5 foot 5, weight 116 which is stable, BMI 19.3.  HEENT; nontraumatic normocephalic EOMI PERRLA sclera anicteric, Cardiovascular; regular rate and rhythm with S1-S2, Pulmonary  ;clear bilaterally, Abdomen ;soft, she has some mild tenderness in the right lower abdomen there is no guarding or rebound no palpable mass or hepatosplenomegaly bowel sounds are present, Rectal; exam patient has an inflamed slightly ulcerated external hemorrhoid which is tender nonthrombosed, digital exam is negative stool negative for occult blood, Extremities; no clubbing cyanosis or edema skin warm dry, Neuro psych; mood  and affect appropriate       Assessment & Plan:   #76 62 year old white female with history of T2 N2 M0 small cell lung cancer diagnosed 2017 with right mediastinal adenopathy.  She completed chemotherapy radiation and prophylactic cranial radiation. #2Weight loss-weight has actually been stable over the past 2 months #3.  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  See discussion as above no further workup was recommended per vascular surgery #4.  Coronary artery disease status post MI and multiple PCI's #5.  Chronic antiplatelet therapy-on Plavix #6.  Hypertension #7.  COPD #8.  History of sessile serrated adenomatous polyps-last colonoscopy May 2017 indicated for 5-year interval follow-up #9Chronic GERD with stricture status post dilation 2017 #10 inflamed external hemorrhoid #11 dysuria rule out UTI  Plan; patient was reassured today that recent CT of the abdomen and pelvis showed no evidence of malignancy.  I do not have an answer for her intermittent abdominal discomfort, this may be spasm related.  She is not interested in a trial of antispasmodics. Will start Anusol HC suppository and Anusol HC cream for symptomatic external hemorrhoid, will also start recticare 5% lidocaine apply 3-4 times daily as needed for discomfort. Check labs today including UA We will arrange for sooner follow-up  with Michele [name] as her primary concern is for recurrence of cancer. Patient is not due for follow-up colonoscopy until 2022, no sign of colon lesion on recent CT scan, if she continues to complain of right-sided pain may opt to proceed with colonoscopy sooner.        S  PA-C 06/17/2017   Cc: [name], MD

## 2017-06-17 NOTE — Progress Notes (Signed)
I agree with the above note, plan 

## 2017-06-17 NOTE — Telephone Encounter (Signed)
I think Dr Julien Nordmann should decide - lets just get her an appt with him soon - thanks

## 2017-06-18 NOTE — Telephone Encounter (Signed)
Primary CMA is working on an appointment for her with Dr Inda Merlin. Her labs were reviewed with her on 06/17/17.

## 2017-06-23 ENCOUNTER — Telehealth: Payer: Self-pay

## 2017-06-23 ENCOUNTER — Other Ambulatory Visit: Payer: Self-pay

## 2017-06-23 ENCOUNTER — Telehealth: Payer: Self-pay | Admitting: Physician Assistant

## 2017-06-23 DIAGNOSIS — R109 Unspecified abdominal pain: Secondary | ICD-10-CM

## 2017-06-23 NOTE — Telephone Encounter (Signed)
Spoke to patient lower ext venous doppler results given.She stated she has been having chest pain off and on for the past 2 to 3 weeks.Stated pain wakes her up at night.No chest pain at present.Appointment scheduled with Jory Sims DNP 06/25/17 at 11:30 am.

## 2017-06-23 NOTE — Telephone Encounter (Signed)
Gave her the results as interpreted by her provider. CMet was not drawn. She will return for the CMet this week.

## 2017-06-25 ENCOUNTER — Encounter: Payer: Self-pay | Admitting: Physician Assistant

## 2017-06-25 ENCOUNTER — Ambulatory Visit: Payer: Self-pay | Admitting: Adult Health

## 2017-06-25 ENCOUNTER — Encounter: Payer: Self-pay | Admitting: Internal Medicine

## 2017-06-26 ENCOUNTER — Encounter: Payer: Self-pay | Admitting: Cardiology

## 2017-06-27 ENCOUNTER — Telehealth: Payer: Self-pay | Admitting: Physician Assistant

## 2017-06-27 ENCOUNTER — Other Ambulatory Visit (INDEPENDENT_AMBULATORY_CARE_PROVIDER_SITE_OTHER): Payer: Medicare Other

## 2017-06-27 DIAGNOSIS — R109 Unspecified abdominal pain: Secondary | ICD-10-CM

## 2017-06-27 LAB — COMPREHENSIVE METABOLIC PANEL
ALT: 9 U/L (ref 0–35)
AST: 13 U/L (ref 0–37)
Albumin: 4 g/dL (ref 3.5–5.2)
Alkaline Phosphatase: 65 U/L (ref 39–117)
BUN: 13 mg/dL (ref 6–23)
CO2: 33 mEq/L — ABNORMAL HIGH (ref 19–32)
Calcium: 9.5 mg/dL (ref 8.4–10.5)
Chloride: 103 mEq/L (ref 96–112)
Creatinine, Ser: 0.59 mg/dL (ref 0.40–1.20)
GFR: 109.32 mL/min (ref >60.00)
Glucose, Bld: 112 mg/dL — ABNORMAL HIGH (ref 70–99)
Potassium: 3.6 mEq/L (ref 3.5–5.1)
Sodium: 142 mEq/L (ref 135–145)
Total Bilirubin: 0.4 mg/dL (ref 0.2–1.2)
Total Protein: 6.3 g/dL (ref 6.0–8.3)

## 2017-06-27 NOTE — Telephone Encounter (Signed)
Advised patient there is an order in the system. It is a "future" order for the Cmet that was not drawn on 06/17/17

## 2017-06-27 NOTE — Telephone Encounter (Signed)
Pt states she thought she was suppose to come in for another lab but the lab says it is not in the system. Pt requesting call to discuss if she needs this and if it can be ordered.

## 2017-07-01 ENCOUNTER — Telehealth: Payer: Self-pay | Admitting: Physician Assistant

## 2017-07-01 NOTE — Telephone Encounter (Signed)
Advised 

## 2017-07-02 ENCOUNTER — Ambulatory Visit: Payer: Self-pay | Admitting: Adult Health

## 2017-07-02 DIAGNOSIS — H7203 Central perforation of tympanic membrane, bilateral: Secondary | ICD-10-CM | POA: Diagnosis not present

## 2017-07-02 DIAGNOSIS — H6983 Other specified disorders of Eustachian tube, bilateral: Secondary | ICD-10-CM | POA: Diagnosis not present

## 2017-07-02 DIAGNOSIS — R070 Pain in throat: Secondary | ICD-10-CM | POA: Diagnosis not present

## 2017-07-03 ENCOUNTER — Telehealth: Payer: Self-pay | Admitting: Pulmonary Disease

## 2017-07-03 NOTE — Telephone Encounter (Signed)
Pt is returning call. Cb is [phone]

## 2017-07-03 NOTE — Telephone Encounter (Signed)
VS please advise if you want [place] to initiate the PA for the Levalbuterol HFA since her insurance will not cover this med, or can we change to another inhaler?  Please advise. Thanks  Allergies  Allergen Reactions  . Ciprofloxacin Other (See Comments)    Reaction:  Hypoglycemia   . Aspirin Other (See Comments)    Reaction:  GI upset   Pt is only able to take enteric coated Aspirin.    . Crestor [Rosuvastatin] Other (See Comments)    Reaction:  Muscle pain   . Ibuprofen Other (See Comments)    Reaction:  GI upset   . Wellbutrin [Bupropion] Palpitations  . Albuterol Palpitations  . Lipitor [Atorvastatin] Other (See Comments)    Reaction:  Muscle pain   . Sulfonamide Derivatives Itching and Rash

## 2017-07-03 NOTE — Telephone Encounter (Signed)
LVM for patient to return call regarding fax rec'd from [facility] states the [facility] is not covered. Need the patients RX info to start PA on [email or web address]

## 2017-07-04 DIAGNOSIS — R21 Rash and other nonspecific skin eruption: Secondary | ICD-10-CM | POA: Diagnosis not present

## 2017-07-04 DIAGNOSIS — R590 Localized enlarged lymph nodes: Secondary | ICD-10-CM | POA: Diagnosis not present

## 2017-07-08 ENCOUNTER — Other Ambulatory Visit: Payer: Self-pay | Admitting: Physician Assistant

## 2017-07-08 DIAGNOSIS — R590 Localized enlarged lymph nodes: Secondary | ICD-10-CM

## 2017-07-08 NOTE — Telephone Encounter (Signed)
Called pharmacy, and per pharmacist Ventolin is the covered preference of Albuterol. Attempted to send rx for albuterol, but on her allergy list it states that albuterol gives pt heart palpitations.  VS please advise.  Thanks!

## 2017-07-08 NOTE — Telephone Encounter (Signed)
Please find out what brand of albuterol is approved by her insurance and change her to this please.

## 2017-07-09 ENCOUNTER — Telehealth: Payer: Self-pay

## 2017-07-09 ENCOUNTER — Encounter: Payer: Self-pay | Admitting: Cardiology

## 2017-07-09 ENCOUNTER — Encounter: Payer: Self-pay | Admitting: Adult Health

## 2017-07-09 ENCOUNTER — Ambulatory Visit (INDEPENDENT_AMBULATORY_CARE_PROVIDER_SITE_OTHER): Payer: Medicare Other | Admitting: Adult Health

## 2017-07-09 VITALS — BP 104/60 | HR 82 | Resp 16 | Ht 65.0 in | Wt 116.0 lb

## 2017-07-09 DIAGNOSIS — R5381 Other malaise: Secondary | ICD-10-CM

## 2017-07-09 DIAGNOSIS — R5382 Chronic fatigue, unspecified: Secondary | ICD-10-CM | POA: Diagnosis not present

## 2017-07-09 DIAGNOSIS — E785 Hyperlipidemia, unspecified: Secondary | ICD-10-CM

## 2017-07-09 DIAGNOSIS — I2510 Atherosclerotic heart disease of native coronary artery without angina pectoris: Secondary | ICD-10-CM

## 2017-07-09 DIAGNOSIS — I10 Essential (primary) hypertension: Secondary | ICD-10-CM

## 2017-07-09 DIAGNOSIS — R079 Chest pain, unspecified: Secondary | ICD-10-CM

## 2017-07-09 NOTE — Telephone Encounter (Signed)
RECALL ENTERED

## 2017-07-09 NOTE — Telephone Encounter (Signed)
Please have her check with her cardiologist if using ventolin is okay.  If not, then we will need to do PA for xopenex.

## 2017-07-09 NOTE — Telephone Encounter (Signed)
Spoke with pharmacist, PA is not needed on this medication due to using generic. Nothing further needed.

## 2017-07-09 NOTE — Patient Instructions (Signed)
Medication Instructions:  NO CHANGES-Your physician recommends that you continue on your current medications as directed. Please refer to the Current Medication list given to you today.  If you need a refill on your cardiac medications before your next appointment, please call your pharmacy.  Follow-Up: Your physician wants you to follow-up in: [address] [place].   Thank you for choosing [facility] at [facility]!!

## 2017-07-09 NOTE — Telephone Encounter (Signed)
LEFT DETAILED MESSAGE FOR PT- DR HARDING WANTED ME TO CANCEL SCHEDULED APPT IN [facility] AND RESCHEDULE IN [place]. PLEASE SCHEDULE THIS APPT WHEN SHE CALLS BACK

## 2017-07-09 NOTE — Telephone Encounter (Signed)
Called and spoke with patient, patient states that she has been using xopenex due to the ventolin causing heart palpitations. I will start PA on xopenex so that the patient can start using this. Will leave open for follow up.
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
## 2017-07-11 ENCOUNTER — Ambulatory Visit
Admission: RE | Admit: 2017-07-11 | Discharge: 2017-07-11 | Disposition: A | Discharge status: Home / Self Care | Payer: Medicare Other | Source: Ambulatory Visit | Attending: Physician Assistant | Admitting: Physician Assistant

## 2017-07-11 DIAGNOSIS — R221 Localized swelling, mass and lump, neck: Secondary | ICD-10-CM | POA: Diagnosis not present

## 2017-07-11 DIAGNOSIS — R590 Localized enlarged lymph nodes: Secondary | ICD-10-CM

## 2017-07-16 ENCOUNTER — Ambulatory Visit (INDEPENDENT_AMBULATORY_CARE_PROVIDER_SITE_OTHER)
Admission: RE | Admit: 2017-07-16 | Discharge: 2017-07-16 | Disposition: A | Discharge status: Home / Self Care | Payer: Medicare Other | Source: Ambulatory Visit | Attending: Pulmonary Disease | Admitting: Pulmonary Disease

## 2017-07-16 ENCOUNTER — Ambulatory Visit (INDEPENDENT_AMBULATORY_CARE_PROVIDER_SITE_OTHER): Payer: Medicare Other | Admitting: Pulmonary Disease

## 2017-07-16 ENCOUNTER — Encounter: Payer: Self-pay | Admitting: Pulmonary Disease

## 2017-07-16 VITALS — BP 110/78 | HR 81 | Ht 65.0 in | Wt 115.0 lb

## 2017-07-16 DIAGNOSIS — J440 Chronic obstructive pulmonary disease with acute lower respiratory infection: Secondary | ICD-10-CM | POA: Diagnosis not present

## 2017-07-16 DIAGNOSIS — J209 Acute bronchitis, unspecified: Secondary | ICD-10-CM

## 2017-07-16 DIAGNOSIS — I2510 Atherosclerotic heart disease of native coronary artery without angina pectoris: Secondary | ICD-10-CM | POA: Diagnosis not present

## 2017-07-16 DIAGNOSIS — R05 Cough: Secondary | ICD-10-CM | POA: Diagnosis not present

## 2017-07-16 DIAGNOSIS — R079 Chest pain, unspecified: Secondary | ICD-10-CM | POA: Diagnosis not present

## 2017-07-16 MED ORDER — AMOXICILLIN-POT CLAVULANATE 875-125 MG PO TABS: 1.0000 | ORAL_TABLET | Freq: Two times a day (BID) | ORAL | 0 refills | Status: DC

## 2017-07-16 NOTE — Progress Notes (Signed)
[facility]  Chief Complaint  Patient presents with  . Acute Visit    Pt has productive cough- white thick mucus, pt states lungs and chest hurt last 4 weeks. Pt has SOB, wheezing, and hears a rattle when coughing.     Vital signs: BP 110/78 (BP Location: Left Arm, Cuff Size: Normal)   Pulse 81   Ht 5\' 5"  (1.651 m)   Wt 115 lb (52.2 kg)   SpO2 99%   BMI 19.14 kg/m   History of Present Illness: Michele [name] is a 62 y.o. female former smoker with COPD/emphysema.  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  She has been getting a cough with clear to yellow sputum.  She is feeling sore in her chest.  She gets hot and then chilled.  She has sinus congestion.  She is feeling weak.  Physical Exam:  General - thin Eyes - pupils reactive ENT - no sinus tenderness, no oral exudate, no LAN, boggy nasal mucosa Cardiac - regular, no murmur Chest - no wheeze, rales Abd - soft, non tender Ext - no edema Skin - no rashes Neuro - normal strength Psych - normal mood  Assessment/Plan:  COPD with emphysema and chronic bronchitis. - she has an acute bronchitis - will check chest xray - will give course of augmentin - don't think she needs prednisone at this time - continue spiriva and prn xopenex   Hx of small cell lung cancer. - under observation with Dr. Julien Nordmann   Patient Instructions  Augmentin 1 pill twice per day for 7 days  Chest xray today  Call if not feeling better after finishing antibiotic  Follow up in 6 months   [name], MD [place] 07/16/2017, 12:25 PM Pager:  [phone]  Flow Sheet  Pulmonary tests: PFT 12/23/11 >> FEV1 2.13 (85%), FEV1% 59, TLC 5.98 (112%), DLCO 60% CT chest 08/24/16 >> CAD, mod/severe emphysema  Cardiac tests Echo 12/19/13 >> EF 65 to 70%  Past Medical History: She  has a past medical history of Anginal pain ([place]), ANXIETY, Arthritis, Asthma,  Borderline hypertension, CAD S/P percutaneous coronary angioplasty (10/2011, 11/2011; 11/20/2012), Cataract, Chronic kidney disease, Collagen vascular disease ([place]), CONTACT DERMATITIS&OTHER ECZEMA DUE UNSPEC CAUSE, COPD, DEPRESSION, DERMATOFIBROMA, DYSLIPIDEMIA, Dysrhythmia, Emphysema of lung ([place]), Encounter for antineoplastic chemotherapy (03/12/2016), GERD, Hepatitis, Hiatal hernia, History of nuclear stress test (03/03/2012), History of radiation therapy (03/19/16- 05/06/16), History of radiation therapy (06/25/16- 07/08/16), History ST elevation myocardial infarction (STEMI) of inferolateral wall (10/2011), Hypertension, INSOMNIA, KNEE PAIN, CHRONIC, LOW BACK PAIN, RESTLESS LEG SYNDROME, Seizures ([place]), Shortness of breath dyspnea, Small cell lung carcinoma ([place]) (02/26/2016), SPONDYLOSIS, CERVICAL, WITH RADICULOPATHY, Tobacco abuse, Tuberculosis, and VITAMIN D DEFICIENCY.  Past Surgical History: She  has a past surgical history that includes Leg wound repair / closure (2542); Coronary angioplasty with stent (10/10/11); Coronary angioplasty with stent (11/06/11); Tonsillectomy; Tubal ligation (1970's); Knee surgery; Coronary angioplasty with stent (11/19/2012); doppler echocardiography (May 2013; September 2015); NM MYOVIEW LTD (October 2013; 12/2013); CPET (09/07/2012); Cardiac catheterization (03/02/2014); left heart catheterization with coronary angiogram (N/A, 10/10/2011); percutaneous coronary stent intervention (pci-s) (N/A, 11/06/2011); left heart catheterization with coronary angiogram (N/A, 11/19/2012); left heart catheterization with coronary angiogram (N/A, 02/19/2013); left heart catheterization with coronary angiogram (N/A, 03/02/2014); Colonoscopy; Polypectomy; Video bronchoscopy with endobronchial ultrasound (N/A, 02/14/2016); Direct laryngoscopy (N/A, 02/14/2016); OTHER SURGICAL HISTORY; Breast biopsy (2000's); NM MYOVIEW LTD (02/2016); and lipoma surgery (Left, 10/2016).  Family History: Her family  history  includes Asthma in her mother; Cancer in her maternal grandmother; Colon polyps in her mother; Diabetes in her mother; Emphysema in her mother; Heart attack in her maternal grandfather; Heart disease in her father and mother; Hyperlipidemia in her mother; Hypertension in her mother; Kidney cancer in her brother; Stomach cancer in her brother and brother; Stroke in her mother; Stroke (age of onset: 85) in her brother.  Social History: She  reports that she quit smoking about 17 months ago. Her smoking use included cigarettes. She has a 60.00 pack-year smoking history. she has never used smokeless tobacco. She reports that she does not drink alcohol or use drugs.  Medications: Allergies as of 07/16/2017      Reactions   Ciprofloxacin Other (See Comments)   Reaction:  Hypoglycemia    Aspirin Other (See Comments)   Reaction:  GI upset   Pt is only able to take enteric coated Aspirin.     Crestor [rosuvastatin] Other (See Comments)   Reaction:  Muscle pain    Ibuprofen Other (See Comments)   Reaction:  GI upset    Wellbutrin [bupropion] Palpitations   Zoloft [sertraline Hcl] Other (See Comments)   Insomnia, fatigue    Albuterol Palpitations   Lipitor [atorvastatin] Other (See Comments)   Reaction:  Muscle pain    Sulfonamide Derivatives Itching, Rash      Medication List        Accurate as of 07/16/17 12:25 PM. Always use your most recent med list.          ALPRAZolam 1 MG tablet Commonly known as:  XANAX Take 1 mg by mouth 3 (three) times daily as needed for anxiety.   amoxicillin-clavulanate 875-125 MG tablet Commonly known as:  AUGMENTIN Take 1 tablet by mouth 2 (two) times daily.   CALCIUM 600+D 600-400 MG-UNIT tablet Generic drug:  Calcium Carbonate-Vitamin D Take 1 tablet by mouth daily.   clopidogrel 75 MG tablet Commonly known as:  PLAVIX TAKE 1 TABLET(75 MG) BY MOUTH DAILY   CoQ10 100 MG Caps Take 100 mg by mouth daily.   Fish Oil 1000 MG Caps Take by mouth  daily.   hydrocortisone 25 MG suppository Commonly known as:  ANUSOL-HC Use 1 suppository at bedtime for 10 days then repeat as needed.   isosorbide mononitrate 30 MG 24 hr tablet Commonly known as:  IMDUR TAKE 1 TABLET(30 MG) BY MOUTH AT BEDTIME   levalbuterol 45 MCG/ACT inhaler Commonly known as:  XOPENEX HFA Inhale 1-2 puffs into the lungs every 6 (six) hours as needed for wheezing.   metoprolol succinate 25 MG 24 hr tablet Commonly known as:  TOPROL XL Take 0.5 tablets (12.5 mg total) by mouth daily with supper.   nitroGLYCERIN 0.4 MG SL tablet Commonly known as:  NITROSTAT DISSOLVE 1 TABLET UNDER THE TONGUE EVERY 5 MINUTES AS NEEDED FOR CHEST PAIN   pantoprazole 40 MG tablet Commonly known as:  PROTONIX Take 40 mg by mouth daily.   polyethylene glycol packet Commonly known as:  MIRALAX / GLYCOLAX Take 17 g by mouth daily.   pravastatin 40 MG tablet Commonly known as:  PRAVACHOL TAKE 1 TABLET(40 MG) BY MOUTH EVERY EVENING   tiotropium 18 MCG inhalation capsule Commonly known as:  SPIRIVA Place 18 mcg into inhaler and inhale daily.

## 2017-07-16 NOTE — Patient Instructions (Signed)
Augmentin 1 pill twice per day for 7 days  Chest xray today  Call if not feeling better after finishing antibiotic  Follow up in 6 months

## 2017-07-17 ENCOUNTER — Telehealth: Payer: Self-pay | Admitting: Pulmonary Disease

## 2017-07-17 MED ORDER — FLUCONAZOLE 100 MG PO TABS: 100.0000 mg | ORAL_TABLET | Freq: Every day | ORAL | 0 refills | Status: DC

## 2017-07-17 NOTE — Telephone Encounter (Signed)
Dg Chest 2 View  Result Date: 07/16/2017 CLINICAL DATA:  Cough, chest congestion, and chest pain for the past 2 months. History of COPD, lung malignancy, former smoker, coronary artery disease and previous MI. EXAM: CHEST  2 VIEW COMPARISON:  Chest x-ray of February 20, 2017 FINDINGS: The lungs are mildly hyperinflated. There is no focal infiltrate. There is no pleural effusion. No pulmonary parenchymal nodules or masses are observed. The heart is normal in size. A coronary artery stent is visible. There calcification in the wall of the aortic arch. The pulmonary vascularity is normal. The bony thorax exhibits no acute abnormality. IMPRESSION: COPD. No pneumonia, CHF, nor other acute cardiopulmonary abnormality. Thoracic aortic atherosclerosis. Electronically Signed   By: David  Martinique M.D.   On: 07/16/2017 14:56     Chest xray shows expected changes of COPD.  No other worrisome findings.  No change to treatment plan as detailed on 07/16/17.

## 2017-07-17 NOTE — Telephone Encounter (Signed)
Advised pt of results. Pt understood and still wonders why she is still having left lung pain. She requested an antifungal to be sent to the pharmacy because of the antibiotics she is taking. VS please advise.     [facility] [address] and [place]

## 2017-07-17 NOTE — Telephone Encounter (Signed)
Pt is aware of below message and voiced her understanding. Rx for Diflucan 100mg  has been sent to preferred pharmacy. Nothing further is needed.

## 2017-07-17 NOTE — Telephone Encounter (Signed)
Recall entered for [facility] requested appt in 01-2018

## 2017-07-17 NOTE — Telephone Encounter (Signed)
Appt cancelled

## 2017-07-17 NOTE — Telephone Encounter (Signed)
Her chest soreness is likely from coughing.  She can try prn tylenol or motrin.  Can send diflucan 100 mg daily for 5 days.

## 2017-07-17 NOTE — Telephone Encounter (Signed)
Pt is requesting CXR from 07/16/17 today if possible.  VS please advise. Thanks.

## 2017-07-18 DIAGNOSIS — S61411A Laceration without foreign body of right hand, initial encounter: Secondary | ICD-10-CM | POA: Diagnosis not present

## 2017-07-23 ENCOUNTER — Encounter: Payer: Self-pay | Admitting: Medical Oncology

## 2017-07-23 ENCOUNTER — Telehealth: Payer: Self-pay | Admitting: *Deleted

## 2017-07-23 NOTE — Telephone Encounter (Signed)
She had several studies for similar complaints recently including MRI of the brain as well as PET scan in September 2018 that showed no concerning findings.  We will order PET scan only if we see any concerning findings on the upcoming CT scan.  Follow-up as previously scheduled.

## 2017-07-23 NOTE — Telephone Encounter (Signed)
Received call from pt stating that she was having some swelling in her neck & went to [facility] office & NP ordered US of neck.  She has read report on [employer] & is worried.  She reports cough & spitting up thick white phlegm for months.  She has seen a pulmonologist & PCP & also went to ENT for roaring in her ears that continues despite tubes.  She also reports stomach pains.  She has been on ATB which she reports she only took x 4 days & stopped d/t side effects.  Suggested she call office that ordered US to discuss but informed that report looked good.  She thinks she needs a PET scan.  Informed that CT ordered before next visit in April.  She states she wished it was a PET.  She states she has c/o & no one calls her back.  Reassured that message would be sent to Dr Olene Floss.  Call back # [phone]

## 2017-07-25 ENCOUNTER — Other Ambulatory Visit: Payer: Self-pay | Admitting: Family Medicine

## 2017-07-25 DIAGNOSIS — Z1231 Encounter for screening mammogram for malignant neoplasm of breast: Secondary | ICD-10-CM

## 2017-07-29 ENCOUNTER — Encounter: Payer: Self-pay | Admitting: Obstetrics & Gynecology

## 2017-07-30 ENCOUNTER — Telehealth: Payer: Self-pay | Admitting: Pulmonary Disease

## 2017-07-30 ENCOUNTER — Ambulatory Visit
Admission: RE | Admit: 2017-07-30 | Discharge: 2017-07-30 | Disposition: A | Discharge status: Home / Self Care | Payer: Medicare Other | Source: Ambulatory Visit | Attending: Family Medicine | Admitting: Family Medicine

## 2017-07-30 DIAGNOSIS — Z1231 Encounter for screening mammogram for malignant neoplasm of breast: Secondary | ICD-10-CM

## 2017-07-30 NOTE — Telephone Encounter (Signed)
Received letter from [employer] stating that Levalbuterol HFA is non-formulary.    Dr. Halford Chessman please advise if you want a different HFA ordered or if you would like [place] to continue and submit a prior authorization. Thank you!  Routing to Dr. Halford Chessman and Vida Roller for follow up.

## 2017-07-30 NOTE — Telephone Encounter (Signed)
Called [employer] clinical pharmacy review at [phone] and initiated PA for Levalbuterol.  This has been sent for review, and a determination is expected within 72 hours via fax.  Case #: [id number].  Routing to [facility] for follow-up.

## 2017-07-30 NOTE — Telephone Encounter (Signed)
Spoke with pt. She is aware of Dr. Juanetta Gosling recommendations. Nothing further was needed.

## 2017-07-30 NOTE — Telephone Encounter (Signed)
She gets palpitations when using albuterol.  Please submit a PA for levalbuterol.

## 2017-07-30 NOTE — Telephone Encounter (Signed)
Spoke with pt. States that she is having issues with urination. Reports that she has finished Augmentin and Diflucan. States that she has been having increased frequency, burning while urinating and this morning she could not control her urine and she urinated all over herself. Pt is wanting to know if these could be side effects of these medications. States that she is going to contact her PCP about this too.  Dr. Halford Chessman - please advise. Thanks.

## 2017-07-30 NOTE — Telephone Encounter (Signed)
It is unlikely that these are side effects from augmentin or diflucan.  She should follow up with her PCP to assess further.

## 2017-07-31 DIAGNOSIS — N393 Stress incontinence (female) (male): Secondary | ICD-10-CM | POA: Diagnosis not present

## 2017-07-31 DIAGNOSIS — N281 Cyst of kidney, acquired: Secondary | ICD-10-CM | POA: Diagnosis not present

## 2017-07-31 DIAGNOSIS — M545 Low back pain: Secondary | ICD-10-CM | POA: Diagnosis not present

## 2017-07-31 DIAGNOSIS — N3020 Other chronic cystitis without hematuria: Secondary | ICD-10-CM | POA: Diagnosis not present

## 2017-07-31 DIAGNOSIS — N952 Postmenopausal atrophic vaginitis: Secondary | ICD-10-CM | POA: Diagnosis not present

## 2017-07-31 DIAGNOSIS — R350 Frequency of micturition: Secondary | ICD-10-CM | POA: Diagnosis not present

## 2017-08-01 ENCOUNTER — Telehealth: Payer: Self-pay | Admitting: Pulmonary Disease

## 2017-08-01 NOTE — Telephone Encounter (Signed)
Called [employer] to check on the status of the PA for the levalbuterol.  This has been approved through 06/02/2018.

## 2017-08-01 NOTE — Telephone Encounter (Signed)
rec'd documents from [place] stating approval coverage of levabuterol tar hfa 12mcg.  The Josem Kaufmann is good until 06-02-2018 Nothing further needed

## 2017-08-04 ENCOUNTER — Telehealth: Payer: Self-pay | Admitting: Medical Oncology

## 2017-08-04 NOTE — Telephone Encounter (Signed)
Today received on call fax( dated 2/28) re pt symptoms- incontinence, low grade fever 99.2 f back pain. Saw md 2/28 started on antibiotics and something for yeast infection. Seeing urology 3/1 per note.and pt was instructed by on call provider  to go to ED.

## 2017-08-10 ENCOUNTER — Other Ambulatory Visit: Payer: Self-pay | Admitting: Cardiology

## 2017-08-10 DIAGNOSIS — I2510 Atherosclerotic heart disease of native coronary artery without angina pectoris: Secondary | ICD-10-CM

## 2017-08-10 DIAGNOSIS — Z9861 Coronary angioplasty status: Principal | ICD-10-CM

## 2017-08-12 ENCOUNTER — Encounter: Payer: Self-pay | Admitting: Gastroenterology

## 2017-08-12 ENCOUNTER — Other Ambulatory Visit (INDEPENDENT_AMBULATORY_CARE_PROVIDER_SITE_OTHER): Payer: Medicare Other

## 2017-08-12 ENCOUNTER — Ambulatory Visit (INDEPENDENT_AMBULATORY_CARE_PROVIDER_SITE_OTHER): Payer: Medicare Other | Admitting: Gastroenterology

## 2017-08-12 VITALS — BP 110/68 | HR 84 | Ht 65.5 in | Wt 115.6 lb

## 2017-08-12 DIAGNOSIS — K649 Unspecified hemorrhoids: Secondary | ICD-10-CM | POA: Diagnosis not present

## 2017-08-12 DIAGNOSIS — Z9861 Coronary angioplasty status: Secondary | ICD-10-CM | POA: Diagnosis not present

## 2017-08-12 DIAGNOSIS — I2510 Atherosclerotic heart disease of native coronary artery without angina pectoris: Secondary | ICD-10-CM | POA: Diagnosis not present

## 2017-08-12 LAB — CBC WITH DIFFERENTIAL/PLATELET
Basophils Absolute: 0 10*3/uL (ref 0.0–0.1)
Basophils Relative: 0.4 % (ref 0.0–3.0)
Eosinophils Absolute: 0 10*3/uL (ref 0.0–0.7)
Eosinophils Relative: 0.6 % (ref 0.0–5.0)
HCT: 40.7 % (ref 36.0–46.0)
Hemoglobin: 14 g/dL (ref 12.0–15.0)
Lymphocytes Relative: 8.7 % — ABNORMAL LOW (ref 12.0–46.0)
Lymphs Abs: 0.5 10*3/uL — ABNORMAL LOW (ref 0.7–4.0)
MCHC: 34.4 g/dL (ref 30.0–36.0)
MCV: 93.8 fl (ref 78.0–100.0)
Monocytes Absolute: 0.3 10*3/uL (ref 0.1–1.0)
Monocytes Relative: 5.1 % (ref 3.0–12.0)
Neutro Abs: 5.2 10*3/uL (ref 1.4–7.7)
Neutrophils Relative %: 85.2 % — ABNORMAL HIGH (ref 43.0–77.0)
Platelets: 191 10*3/uL (ref 150.0–400.0)
RBC: 4.34 Mil/uL (ref 3.87–5.11)
RDW: 12.9 % (ref 11.5–15.5)
WBC: 6.1 10*3/uL (ref 4.0–10.5)
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
## 2017-08-12 NOTE — Patient Instructions (Addendum)
Please start taking citrucel (orange flavored) powder fiber supplement.  This may cause some bloating at first but that usually goes away. Begin with a small spoonful and work your way up to a large, heaping spoonful daily over a week.  You will have labs checked today in the basement lab.  Please head down after you check out with the front desk  (cbc).   Normal BMI (Body Mass Index- based on height and weight) is between 19 and 25. Your BMI today is Body mass index is 18.94 kg/m. [name] Please consider follow up  regarding your BMI with your Primary Care Provider.

## 2017-08-13 ENCOUNTER — Ambulatory Visit: Payer: Self-pay | Admitting: Cardiology

## 2017-08-13 ENCOUNTER — Ambulatory Visit (INDEPENDENT_AMBULATORY_CARE_PROVIDER_SITE_OTHER): Payer: Medicare Other | Admitting: Cardiology

## 2017-08-13 ENCOUNTER — Encounter: Payer: Self-pay | Admitting: Cardiology

## 2017-08-13 ENCOUNTER — Telehealth: Payer: Self-pay | Admitting: Gastroenterology

## 2017-08-13 VITALS — BP 102/62 | HR 95 | Ht 65.5 in | Wt 115.2 lb

## 2017-08-13 DIAGNOSIS — E785 Hyperlipidemia, unspecified: Secondary | ICD-10-CM | POA: Diagnosis not present

## 2017-08-13 DIAGNOSIS — Z720 Tobacco use: Secondary | ICD-10-CM

## 2017-08-13 DIAGNOSIS — I951 Orthostatic hypotension: Secondary | ICD-10-CM

## 2017-08-13 DIAGNOSIS — I208 Other forms of angina pectoris: Secondary | ICD-10-CM | POA: Diagnosis not present

## 2017-08-13 DIAGNOSIS — R5382 Chronic fatigue, unspecified: Secondary | ICD-10-CM

## 2017-08-13 DIAGNOSIS — I25119 Atherosclerotic heart disease of native coronary artery with unspecified angina pectoris: Secondary | ICD-10-CM

## 2017-08-13 DIAGNOSIS — Z01818 Encounter for other preprocedural examination: Secondary | ICD-10-CM

## 2017-08-13 DIAGNOSIS — I201 Angina pectoris with documented spasm: Secondary | ICD-10-CM | POA: Diagnosis not present

## 2017-08-13 DIAGNOSIS — R5381 Other malaise: Secondary | ICD-10-CM

## 2017-08-13 DIAGNOSIS — I2119 ST elevation (STEMI) myocardial infarction involving other coronary artery of inferior wall: Secondary | ICD-10-CM

## 2017-08-13 DIAGNOSIS — I714 Abdominal aortic aneurysm, without rupture, unspecified: Secondary | ICD-10-CM | POA: Insufficient documentation

## 2017-08-13 DIAGNOSIS — T82855D Stenosis of coronary artery stent, subsequent encounter: Secondary | ICD-10-CM | POA: Diagnosis not present

## 2017-08-13 MED ORDER — METOPROLOL SUCCINATE ER 25 MG PO TB24: ORAL_TABLET | ORAL | 3 refills | Status: DC

## 2017-08-13 NOTE — Assessment & Plan Note (Signed)
Essentially occluded bifurcation circumflex stents in both legs.  Not further PCI option at this time since she has had recurrent stenosis at that location.  Probably best to allow it to stay occluded.  Did not appear to be ischemic on Myoview.  For now would continue to treat medically as best we can with Plavix, statin and beta-blocker along with Imdur. Essentially lifelong Plavix.

## 2017-08-13 NOTE — Assessment & Plan Note (Signed)
Pretty much unbelievably, she is still smoking.  After at least 2 if not 3 MIs and lung cancer but she probably will not quit smoking.  This is an unfortunate fact.

## 2017-08-13 NOTE — Assessment & Plan Note (Signed)
History of multiple infarcts involving the circumflex distribution.  Now essentially occluded native circumflex with preserved EF expected inferolateral hypokinesis.  No real heart failure symptoms.  There is also evidence of prior infarct on Myoview in the past.

## 2017-08-13 NOTE — Telephone Encounter (Signed)
The pt has been advised she is not anemic.

## 2017-08-13 NOTE — Progress Notes (Signed)
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 [phone] [address]. [place], [place] [address]   Thank you for choosing [facility] at [facility]!!

## 2017-08-13 NOTE — Assessment & Plan Note (Signed)
LDL is pretty much close to goal at 70 on trauma call.  At this point in order to avoid any further side effects, I would not further change her medications.  She seems to be tolerating this and I do not want to confuse her with changing medications.

## 2017-08-13 NOTE — Assessment & Plan Note (Signed)
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  Her RCA stent remains patent.  She had a Myoview that was negative in September 2017 which indicates that there is collateral flow from right to left to the circumflex distribution. She has chronic chest discomfort off and on.  But pretty much has been controlled with Imdur.  Because of her hypotension I cannot do much more than low-dose metoprolol and Imdur.  She remains on Plavix without aspirin. She is on statin plus co-Q10

## 2017-08-13 NOTE — Patient Instructions (Addendum)
MEDICATION INSTRUCTIONS  If dizzy and Blood pressure  Is less than  100/? Systolic ( top number)  Do not take   IMDUR /ISOSOROBDE OR METOPROLOL SUCCINATE /TOPROL which ever medication is due to be taken at that time.  The METOPROLOL SUCCINATE /TOPROL dose at dinnertime  --- If blood pressure is  100- 120/? Take only 1/2 of Metoprolol 25 mg pill If blood pressure is greater than 120/? Systolic ( top number) take  Whole tablet of METOPROLOL/TOPROL.  IF YOU HAVE A FAST HEARTBEAT AT BEDTIME OR DURING THE MIDDAY. CHECK YOUR BLOOD PRESSURE  AND IF BLOOD PRESSURE IS GREATER THAN 222/? ( SYSTOLIC NUMBER). TAKE 1/2 TABLET OF METOPROLOL  AND EAT AND DRINK SOMETHING. AND NOW IF BLOOD PRESSURE IS LESS THAN 110/? - REST AND DRINK SOME WATER AND EAT.     TEST- SCHEDULE AT [facility]-- AUG 2019   ( CT ANGIO- ABD/PELVIS) WILL NEED TO HAVE LABWORK DONE A  PRIOR  TEST . WILL MAIL YOU LAB SLIP CLOSER TO THE TIMEFRAME. Non-Cardiac CT Angiography (CTA), is a special type of CT scan that uses a computer to produce multi-dimensional views of major blood vessels throughout the body. In CT angiography, a contrast material is injected through an IV to help visualize the blood vessels    Your physician wants you to follow-up in AUG 2019  AFTER CT IS BEEN COMPLETED,WITH DR [place]. You will receive a reminder letter in the mail two months in advance. If you don't receive a letter, please call our office to schedule the follow-up appointment.  If you need a refill on your cardiac medications before your next appointment, please call your pharmacy.

## 2017-08-13 NOTE — Assessment & Plan Note (Signed)
I think this is clearly related to depression.  As a result I am also not trying to push the beta-blocker any further.  She had finally agreed to and had been started on an SSRI the last 2 visits, but now she is no longer on it because she is scared of the symptoms.  She is taking as needed Xanax which is probably good, but treating the underlying cause is probably a better option.  Again I will defer this to her PCP, as this is not something I feel comfortable treating.  I simply can speak from experience that she is clearly demonstrate any signs of anxiety disorder and has symptoms of depression.

## 2017-08-13 NOTE — Assessment & Plan Note (Signed)
Very concerning issue over the last couple months is that she had a CT scan done for abdominal pain and there was suggestion of a mural thrombus distal vascular surgery and that is something that they would not be concerned about but would simply just monitor. Plan: Check CTA abdomen in August Continue blood pressure and lipid

## 2017-08-13 NOTE — Assessment & Plan Note (Addendum)
It is truly difficult to determine whether she is actively having angina or musculoskeletal chest pain.  For the most part her exertional chest pain is well controlled with low-dose Imdur and beta-blocker.  She clearly has reason for having angina, but had a negative Myoview with similar symptoms.  I am reluctant to evaluate further unless her symptoms get worse.

## 2017-08-13 NOTE — Assessment & Plan Note (Addendum)
Ever since she was starting to be treated for her cancer, she has been borderline hypotensive as opposed to hypertensive was converted from carvedilol to metoprolol, her ARB/ACE inhibitor were discontinued but she is only on very low-dose beta-blocker and Plan: If her blood pressure is suspect we may be able to go up to full 25 mg of metoprolol at night because she is having palpitations.  I gave strict instructions and the patient instructions section below.   If dizzy and Blood pressure  Is less than  100/? Systolic ( top number)  Do not take   IMDUR /ISOSOROBDE OR METOPROLOL SUCCINATE /TOPROL which ever medication is due to be taken at that time.  The METOPROLOL SUCCINATE /TOPROL dose at dinnertime  --- If blood pressure is  100- 120/? Take only 1/2 of Metoprolol 25 mg pill If blood pressure is greater than 120/? Systolic ( top number) take  Whole tablet of METOPROLOL/TOPROL.  IF YOU HAVE A FAST HEARTBEAT AT BEDTIME OR DURING THE MIDDAY. CHECK YOUR BLOOD PRESSURE  AND IF BLOOD PRESSURE IS GREATER THAN 388/? ( SYSTOLIC NUMBER). TAKE 1/2 TABLET OF METOPROLOL  AND EAT AND DRINK SOMETHING. AND NOW IF BLOOD PRESSURE IS LESS THAN 110/? - REST AND DRINK SOME WATER AND EAT.

## 2017-08-13 NOTE — Assessment & Plan Note (Signed)
Documented coronary spasm In the past.  Unable to tolerate amlodipine due to hypotension and dizziness.  She is now on low-dose Imdur which she takes nightly.

## 2017-08-15 ENCOUNTER — Telehealth: Payer: Self-pay | Admitting: Cardiology

## 2017-08-15 NOTE — Telephone Encounter (Signed)
New message    Patient calling to request AVS be updated to reflect she is not a smoker.

## 2017-08-19 ENCOUNTER — Telehealth: Payer: Self-pay | Admitting: Physician Assistant

## 2017-08-19 NOTE — Telephone Encounter (Signed)
Patient is notified.

## 2017-08-19 NOTE — Telephone Encounter (Signed)
Please let pt know labs from 3/12 did not include kidney function - was just a cbc

## 2017-08-21 ENCOUNTER — Other Ambulatory Visit: Payer: Self-pay | Admitting: Cardiology

## 2017-08-22 NOTE — Telephone Encounter (Signed)
REFILL 

## 2017-08-25 ENCOUNTER — Ambulatory Visit: Payer: Self-pay | Admitting: Obstetrics & Gynecology

## 2017-08-26 ENCOUNTER — Encounter: Payer: Self-pay | Admitting: Nurse Practitioner

## 2017-08-26 DIAGNOSIS — R21 Rash and other nonspecific skin eruption: Secondary | ICD-10-CM | POA: Diagnosis not present

## 2017-08-29 ENCOUNTER — Observation Stay (HOSPITAL_COMMUNITY)
Admission: EM | Admit: 2017-08-29 | Discharge: 2017-08-31 | Disposition: A | Discharge status: Home / Self Care | Payer: Medicare Other | Attending: Internal Medicine | Admitting: Internal Medicine

## 2017-08-29 ENCOUNTER — Encounter (HOSPITAL_COMMUNITY): Payer: Self-pay | Admitting: *Deleted

## 2017-08-29 ENCOUNTER — Emergency Department (HOSPITAL_COMMUNITY): Payer: Medicare Other

## 2017-08-29 ENCOUNTER — Other Ambulatory Visit: Payer: Self-pay

## 2017-08-29 ENCOUNTER — Telehealth: Payer: Self-pay | Admitting: Cardiology

## 2017-08-29 DIAGNOSIS — Z7902 Long term (current) use of antithrombotics/antiplatelets: Secondary | ICD-10-CM | POA: Insufficient documentation

## 2017-08-29 DIAGNOSIS — Z8611 Personal history of tuberculosis: Secondary | ICD-10-CM | POA: Diagnosis not present

## 2017-08-29 DIAGNOSIS — Z9221 Personal history of antineoplastic chemotherapy: Secondary | ICD-10-CM | POA: Insufficient documentation

## 2017-08-29 DIAGNOSIS — J441 Chronic obstructive pulmonary disease with (acute) exacerbation: Secondary | ICD-10-CM | POA: Diagnosis present

## 2017-08-29 DIAGNOSIS — Z888 Allergy status to other drugs, medicaments and biological substances status: Secondary | ICD-10-CM | POA: Insufficient documentation

## 2017-08-29 DIAGNOSIS — E876 Hypokalemia: Secondary | ICD-10-CM | POA: Insufficient documentation

## 2017-08-29 DIAGNOSIS — I493 Ventricular premature depolarization: Secondary | ICD-10-CM | POA: Insufficient documentation

## 2017-08-29 DIAGNOSIS — J439 Emphysema, unspecified: Secondary | ICD-10-CM | POA: Diagnosis not present

## 2017-08-29 DIAGNOSIS — Z79899 Other long term (current) drug therapy: Secondary | ICD-10-CM | POA: Insufficient documentation

## 2017-08-29 DIAGNOSIS — Z85118 Personal history of other malignant neoplasm of bronchus and lung: Secondary | ICD-10-CM | POA: Insufficient documentation

## 2017-08-29 DIAGNOSIS — F411 Generalized anxiety disorder: Secondary | ICD-10-CM | POA: Diagnosis not present

## 2017-08-29 DIAGNOSIS — R51 Headache: Secondary | ICD-10-CM | POA: Diagnosis not present

## 2017-08-29 DIAGNOSIS — R000 Tachycardia, unspecified: Secondary | ICD-10-CM | POA: Diagnosis not present

## 2017-08-29 DIAGNOSIS — K219 Gastro-esophageal reflux disease without esophagitis: Secondary | ICD-10-CM | POA: Insufficient documentation

## 2017-08-29 DIAGNOSIS — E785 Hyperlipidemia, unspecified: Secondary | ICD-10-CM | POA: Insufficient documentation

## 2017-08-29 DIAGNOSIS — R55 Syncope and collapse: Principal | ICD-10-CM | POA: Diagnosis present

## 2017-08-29 DIAGNOSIS — E559 Vitamin D deficiency, unspecified: Secondary | ICD-10-CM | POA: Insufficient documentation

## 2017-08-29 DIAGNOSIS — I25119 Atherosclerotic heart disease of native coronary artery with unspecified angina pectoris: Secondary | ICD-10-CM | POA: Diagnosis not present

## 2017-08-29 DIAGNOSIS — Z881 Allergy status to other antibiotic agents status: Secondary | ICD-10-CM | POA: Insufficient documentation

## 2017-08-29 DIAGNOSIS — G2581 Restless legs syndrome: Secondary | ICD-10-CM | POA: Diagnosis not present

## 2017-08-29 DIAGNOSIS — F329 Major depressive disorder, single episode, unspecified: Secondary | ICD-10-CM | POA: Insufficient documentation

## 2017-08-29 DIAGNOSIS — Z87891 Personal history of nicotine dependence: Secondary | ICD-10-CM | POA: Insufficient documentation

## 2017-08-29 DIAGNOSIS — I10 Essential (primary) hypertension: Secondary | ICD-10-CM | POA: Insufficient documentation

## 2017-08-29 DIAGNOSIS — Z882 Allergy status to sulfonamides status: Secondary | ICD-10-CM | POA: Insufficient documentation

## 2017-08-29 DIAGNOSIS — C3491 Malignant neoplasm of unspecified part of right bronchus or lung: Secondary | ICD-10-CM | POA: Diagnosis not present

## 2017-08-29 DIAGNOSIS — I959 Hypotension, unspecified: Secondary | ICD-10-CM | POA: Diagnosis not present

## 2017-08-29 DIAGNOSIS — G4489 Other headache syndrome: Secondary | ICD-10-CM | POA: Diagnosis not present

## 2017-08-29 DIAGNOSIS — M170 Bilateral primary osteoarthritis of knee: Secondary | ICD-10-CM | POA: Diagnosis not present

## 2017-08-29 DIAGNOSIS — J438 Other emphysema: Secondary | ICD-10-CM | POA: Diagnosis not present

## 2017-08-29 DIAGNOSIS — Z923 Personal history of irradiation: Secondary | ICD-10-CM | POA: Insufficient documentation

## 2017-08-29 DIAGNOSIS — J449 Chronic obstructive pulmonary disease, unspecified: Secondary | ICD-10-CM | POA: Diagnosis present

## 2017-08-29 DIAGNOSIS — I252 Old myocardial infarction: Secondary | ICD-10-CM | POA: Diagnosis not present

## 2017-08-29 DIAGNOSIS — M359 Systemic involvement of connective tissue, unspecified: Secondary | ICD-10-CM | POA: Insufficient documentation

## 2017-08-29 DIAGNOSIS — R531 Weakness: Secondary | ICD-10-CM | POA: Diagnosis not present

## 2017-08-29 DIAGNOSIS — Z8249 Family history of ischemic heart disease and other diseases of the circulatory system: Secondary | ICD-10-CM | POA: Insufficient documentation

## 2017-08-29 DIAGNOSIS — G40909 Epilepsy, unspecified, not intractable, without status epilepticus: Secondary | ICD-10-CM | POA: Insufficient documentation

## 2017-08-29 DIAGNOSIS — F419 Anxiety disorder, unspecified: Secondary | ICD-10-CM | POA: Diagnosis present

## 2017-08-29 DIAGNOSIS — I2510 Atherosclerotic heart disease of native coronary artery without angina pectoris: Secondary | ICD-10-CM | POA: Insufficient documentation

## 2017-08-29 DIAGNOSIS — M8580 Other specified disorders of bone density and structure, unspecified site: Secondary | ICD-10-CM | POA: Diagnosis not present

## 2017-08-29 DIAGNOSIS — I700 Atherosclerosis of aorta: Secondary | ICD-10-CM | POA: Insufficient documentation

## 2017-08-29 DIAGNOSIS — M479 Spondylosis, unspecified: Secondary | ICD-10-CM | POA: Diagnosis not present

## 2017-08-29 DIAGNOSIS — S0990XA Unspecified injury of head, initial encounter: Secondary | ICD-10-CM | POA: Diagnosis not present

## 2017-08-29 DIAGNOSIS — Z955 Presence of coronary angioplasty implant and graft: Secondary | ICD-10-CM | POA: Insufficient documentation

## 2017-08-29 DIAGNOSIS — Z886 Allergy status to analgesic agent status: Secondary | ICD-10-CM | POA: Insufficient documentation

## 2017-08-29 DIAGNOSIS — R0602 Shortness of breath: Secondary | ICD-10-CM | POA: Diagnosis not present

## 2017-08-29 LAB — CBC
HCT: 42.1 % (ref 36.0–46.0)
Hemoglobin: 14 g/dL (ref 12.0–15.0)
MCH: 31.5 pg (ref 26.0–34.0)
MCHC: 33.3 g/dL (ref 30.0–36.0)
MCV: 94.8 fL (ref 78.0–100.0)
Platelets: 157 10*3/uL (ref 150–400)
RBC: 4.44 MIL/uL (ref 3.87–5.11)
RDW: 13.2 % (ref 11.5–15.5)
WBC: 6.7 10*3/uL (ref 4.0–10.5)

## 2017-08-29 LAB — I-STAT CG4 LACTIC ACID, ED: Lactic Acid, Venous: 0.96 mmol/L (ref 0.5–1.9)

## 2017-08-29 LAB — URINALYSIS, ROUTINE W REFLEX MICROSCOPIC
Bilirubin Urine: NEGATIVE
Glucose, UA: NEGATIVE mg/dL
Ketones, ur: NEGATIVE mg/dL
Leukocytes, UA: NEGATIVE
Nitrite: NEGATIVE
Protein, ur: NEGATIVE mg/dL
RBC / HPF: NONE SEEN RBC/hpf (ref 0–5)
Specific Gravity, Urine: 1.005 (ref 1.005–1.030)
pH: 6 (ref 5.0–8.0)

## 2017-08-29 LAB — BASIC METABOLIC PANEL
Anion gap: 8 (ref 5–15)
BUN: 9 mg/dL (ref 6–20)
CO2: 24 mmol/L (ref 22–32)
Calcium: 9.8 mg/dL (ref 8.9–10.3)
Chloride: 108 mmol/L (ref 101–111)
Creatinine, Ser: 0.52 mg/dL (ref 0.44–1.00)
GFR calc Af Amer: >60 mL/min (ref >60)
GFR calc non Af Amer: >60 mL/min (ref >60)
Glucose, Bld: 134 mg/dL — ABNORMAL HIGH (ref 65–99)
Potassium: 4 mmol/L (ref 3.5–5.1)
Sodium: 140 mmol/L (ref 135–145)

## 2017-08-29 LAB — I-STAT TROPONIN, ED
Troponin i, poc: 0 ng/mL (ref 0.00–0.08)
Troponin i, poc: 0 ng/mL (ref 0.00–0.08)

## 2017-08-29 MED ORDER — ENOXAPARIN SODIUM 40 MG/0.4ML ~~LOC~~ SOLN
40.0000 mg | SUBCUTANEOUS | Status: DC
  Administered 2017-08-29 – 2017-08-30 (×2): 40 mg via SUBCUTANEOUS
  Filled 2017-08-29 (×2): qty 0.4

## 2017-08-29 MED ORDER — PANTOPRAZOLE SODIUM 40 MG PO TBEC
40.0000 mg | DELAYED_RELEASE_TABLET | Freq: Every day | ORAL | Status: DC
  Administered 2017-08-30 – 2017-08-31 (×2): 40 mg via ORAL
  Filled 2017-08-29 (×2): qty 1

## 2017-08-29 MED ORDER — LEVALBUTEROL TARTRATE 45 MCG/ACT IN AERO: 1.0000 | INHALATION_SPRAY | Freq: Four times a day (QID) | RESPIRATORY_TRACT | Status: DC | PRN

## 2017-08-29 MED ORDER — ISOSORBIDE MONONITRATE ER 30 MG PO TB24
30.0000 mg | ORAL_TABLET | Freq: Every day | ORAL | Status: DC
  Administered 2017-08-29 – 2017-08-30 (×2): 30 mg via ORAL
  Filled 2017-08-29 (×3): qty 1

## 2017-08-29 MED ORDER — HYDROCODONE-ACETAMINOPHEN 5-325 MG PO TABS: 1.0000 | ORAL_TABLET | ORAL | Status: DC | PRN

## 2017-08-29 MED ORDER — SODIUM CHLORIDE 0.9 % IV SOLN
INTRAVENOUS | Status: DC
  Administered 2017-08-29: 23:00:00 via INTRAVENOUS

## 2017-08-29 MED ORDER — CLOPIDOGREL BISULFATE 75 MG PO TABS
75.0000 mg | ORAL_TABLET | Freq: Every day | ORAL | Status: DC
  Administered 2017-08-30 – 2017-08-31 (×2): 75 mg via ORAL
  Filled 2017-08-29 (×2): qty 1

## 2017-08-29 MED ORDER — SODIUM CHLORIDE 0.9 % IV BOLUS
500.0000 mL | Freq: Once | INTRAVENOUS | Status: AC
  Administered 2017-08-29: 500 mL via INTRAVENOUS

## 2017-08-29 MED ORDER — TIOTROPIUM BROMIDE MONOHYDRATE 18 MCG IN CAPS
18.0000 ug | ORAL_CAPSULE | Freq: Every day | RESPIRATORY_TRACT | Status: DC
  Administered 2017-08-30 – 2017-08-31 (×2): 18 ug via RESPIRATORY_TRACT
  Filled 2017-08-29 (×2): qty 5

## 2017-08-29 MED ORDER — ISOSORBIDE MONONITRATE ER 30 MG PO TB24: 30.0000 mg | ORAL_TABLET | Freq: Every day | ORAL | Status: DC

## 2017-08-29 MED ORDER — IOPAMIDOL (ISOVUE-370) INJECTION 76%
INTRAVENOUS | Status: AC
  Administered 2017-08-29: 100 mL
  Filled 2017-08-29: qty 100

## 2017-08-29 MED ORDER — ONDANSETRON HCL 4 MG/2ML IJ SOLN: 4.0000 mg | Freq: Four times a day (QID) | INTRAMUSCULAR | Status: DC | PRN

## 2017-08-29 MED ORDER — PRAVASTATIN SODIUM 40 MG PO TABS
40.0000 mg | ORAL_TABLET | Freq: Every day | ORAL | Status: DC
  Administered 2017-08-29 – 2017-08-30 (×2): 40 mg via ORAL
  Filled 2017-08-29 (×2): qty 1

## 2017-08-29 MED ORDER — ONDANSETRON HCL 4 MG PO TABS: 4.0000 mg | ORAL_TABLET | Freq: Four times a day (QID) | ORAL | Status: DC | PRN

## 2017-08-29 MED ORDER — ACETAMINOPHEN 650 MG RE SUPP: 650.0000 mg | Freq: Four times a day (QID) | RECTAL | Status: DC | PRN

## 2017-08-29 MED ORDER — ACETAMINOPHEN 325 MG PO TABS
650.0000 mg | ORAL_TABLET | Freq: Four times a day (QID) | ORAL | Status: DC | PRN
  Administered 2017-08-30: 650 mg via ORAL
  Filled 2017-08-29: qty 2

## 2017-08-29 MED ORDER — OMEGA-3-ACID ETHYL ESTERS 1 G PO CAPS
1.0000 g | ORAL_CAPSULE | Freq: Every day | ORAL | Status: DC
  Administered 2017-08-30 – 2017-08-31 (×2): 1 g via ORAL
  Filled 2017-08-29 (×2): qty 1

## 2017-08-29 MED ORDER — ALPRAZOLAM 0.5 MG PO TABS
1.0000 mg | ORAL_TABLET | Freq: Three times a day (TID) | ORAL | Status: DC
  Administered 2017-08-29 – 2017-08-31 (×5): 1 mg via ORAL
  Filled 2017-08-29: qty 2
  Filled 2017-08-29: qty 4
  Filled 2017-08-29 (×3): qty 2

## 2017-08-29 MED ORDER — SODIUM CHLORIDE 0.9% FLUSH: 3.0000 mL | Freq: Two times a day (BID) | INTRAVENOUS | Status: DC

## 2017-08-29 MED ORDER — LEVALBUTEROL HCL 0.63 MG/3ML IN NEBU
0.6300 mg | INHALATION_SOLUTION | Freq: Four times a day (QID) | RESPIRATORY_TRACT | Status: DC | PRN
Start: 2017-08-29 — End: 2017-08-31

## 2017-08-29 MED ORDER — SENNOSIDES-DOCUSATE SODIUM 8.6-50 MG PO TABS: 1.0000 | ORAL_TABLET | Freq: Every evening | ORAL | Status: DC | PRN

## 2017-08-29 MED ORDER — METOPROLOL SUCCINATE ER 25 MG PO TB24
12.5000 mg | ORAL_TABLET | Freq: Every day | ORAL | Status: DC
Start: 2017-08-30 — End: 2017-08-30
  Filled 2017-08-29: qty 1

## 2017-08-29 MED ORDER — CALCIUM CARBONATE-VITAMIN D 500-200 MG-UNIT PO TABS
1.0000 | ORAL_TABLET | Freq: Every day | ORAL | Status: DC
  Administered 2017-08-30 – 2017-08-31 (×2): 1 via ORAL
  Filled 2017-08-29 (×2): qty 1

## 2017-08-29 NOTE — Telephone Encounter (Signed)
Pt c/o Syncope: STAT if syncope occurred within 30 minutes and pt complains of lightheadedness High Priority if episode of passing out, completely, today or in last 24 hours   Did you pass out today? yes When is the last time you passed out? 2am 1. Has this occurred multiple times? yes  2. Did you have any symptoms prior to passing out? High bp dizziness

## 2017-08-29 NOTE — ED Notes (Signed)
Pt reports feeling short of breath, edp notified. Will continue to monitor.

## 2017-08-29 NOTE — ED Triage Notes (Signed)
Per EMS pt has hx of lung cancer with tx last year, pt has been taking antibiotics for a UTI with frequent reoccurance

## 2017-08-29 NOTE — Telephone Encounter (Signed)
Spoke with patient and she stated she had 3 syncopal episodes during the night hitting her head, bad stomach pain going under her left breast, shortness of breath, and felling "sick and weak". Blood pressures this morning were 150/66 HR 101, 104/66 HR 120, 95/69 HR 115, and 89/67 HR 113 none of which were taken when she passed out. Advised patient she needed to go to ED for evaluation, patient verbalized understanding.

## 2017-08-29 NOTE — ED Notes (Signed)
Pharmacy re-contacted concerning verifying med
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
## 2017-08-29 NOTE — ED Provider Notes (Addendum)
Care assumed from [facility] at 1640 5 PM.  Patient with multiple syncopal episodes during the night.  Per EMS, and upon arrival was hypotensive, systolics in the 26V.  This has rebounded with minimal fluid.  Is not hypoxemic or tachycardic here.  History of significant coronary artery disease.  Last echo showed EF 65.  Did strike her head with syncopal episode.  Awaiting CT scan of head.  Also undergoing CT angiogram of chest to rule out PE as patient has history of small cell lung cancer, she has completed treatment and was awaiting restaging CT.  Will require admission for multiple syncopal episodes after completion of pending studies.  19:35: CTA negative.  EKG shows no evolution.  Patient was hypotensive upon arrival.  She states her cardiologist has been adjusting her medications.  At times she is taking only half of the dose because of low blood pressure readings.  This may be simple hypotension secondary to medications requiring further adjustment.  I discussed the case with Dr. Myna Hidalgo.  Patient will be admitted.   Tanna Furry, MD 08/29/17 1647    Tanna Furry, MD 08/29/17 Joen Laura
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
## 2017-08-29 NOTE — ED Notes (Signed)
Pt requesting Xanax; inquired to whether she could take Xanax she brought from home. Instructed to take hospital-provided medications; called pharmacy with request to verify meds

## 2017-08-30 ENCOUNTER — Observation Stay (HOSPITAL_BASED_OUTPATIENT_CLINIC_OR_DEPARTMENT_OTHER): Payer: Medicare Other

## 2017-08-30 DIAGNOSIS — R55 Syncope and collapse: Secondary | ICD-10-CM

## 2017-08-30 HISTORY — PX: TRANSTHORACIC ECHOCARDIOGRAM: SHX275

## 2017-08-30 LAB — BASIC METABOLIC PANEL
Anion gap: 5 (ref 5–15)
BUN: 7 mg/dL (ref 6–20)
CO2: 24 mmol/L (ref 22–32)
Calcium: 8.8 mg/dL — ABNORMAL LOW (ref 8.9–10.3)
Chloride: 112 mmol/L — ABNORMAL HIGH (ref 101–111)
Creatinine, Ser: 0.52 mg/dL (ref 0.44–1.00)
GFR calc Af Amer: >60 mL/min (ref >60)
GFR calc non Af Amer: >60 mL/min (ref >60)
Glucose, Bld: 85 mg/dL (ref 65–99)
Potassium: 3.4 mmol/L — ABNORMAL LOW (ref 3.5–5.1)
Sodium: 141 mmol/L (ref 135–145)

## 2017-08-30 LAB — ECHOCARDIOGRAM COMPLETE
Height: 66 in
Weight: 1841.28 oz

## 2017-08-30 LAB — GLUCOSE, CAPILLARY: Glucose-Capillary: 87 mg/dL (ref 65–99)

## 2017-08-30 LAB — HIV ANTIBODY (ROUTINE TESTING W REFLEX): HIV Screen 4th Generation wRfx: NONREACTIVE

## 2017-08-30 MED ORDER — POTASSIUM CHLORIDE CRYS ER 20 MEQ PO TBCR
40.0000 meq | EXTENDED_RELEASE_TABLET | Freq: Two times a day (BID) | ORAL | Status: DC
  Administered 2017-08-30 (×2): 40 meq via ORAL
  Filled 2017-08-30 (×3): qty 2

## 2017-08-30 MED ORDER — SODIUM CHLORIDE 0.9 % IV SOLN
INTRAVENOUS | Status: DC
  Administered 2017-08-30 (×2): via INTRAVENOUS

## 2017-08-30 NOTE — Progress Notes (Signed)
Note sent to pharmacy to request isosorbide timing be re-scheduled for bedtime.

## 2017-08-30 NOTE — Progress Notes (Signed)
Patient resting comfortably during shift report. Denies complaints.  

## 2017-08-30 NOTE — Progress Notes (Signed)
Pt refused metoprolol and imdur, states that she takes them at dinner and night.   Vitals at med-pass were WNL.

## 2017-08-30 NOTE — Progress Notes (Signed)
[facility]                                                                              Patient Demographics  Michele [name], is a 62 y.o. female, DOB - August 14, 1955, YBO:[id number]  Admit date - 08/29/2017   Admitting Physician Vianne Bulls, MD  Outpatient Primary MD for the patient is [name], MD  Outpatient specialists:   LOS - 0  days    Chief Complaint  Patient presents with  . Loss of Consciousness       Brief summary  Michele [name] is a 62 y.o. female with medical history significant for  but not limited to lung cancer in remission, coronary artery disease, COPD, and BP and pulse hypotension, presenting with 2 episodes of syncope while he was walking to the bathroom to urinate and when she was being helped to get up after she regained consciousness the second time.She had been recently treated for UTI by PCP.  Patient was noted to be hypotensive on admission initial evaluation at the ED with negative twelve-lead EKG for acute ST-T changes.  Head CT and CT angiogram were all negative.  She was given IV normal saline bolus with improvement of her blood pressure and admitted   Assessment & Plan    Principal Problem:   Syncope Active Problems:   Anxiety state   COPD (chronic obstructive pulmonary disease) (HCC)   CAD -S/P MI-PCI AVG 5/13 then staged DES to RCA  11/06/11. ISR- PCI 11/19/12, cath 02/18/13- no ISR, + spasm, Myoview low risk Nov 2014   Small cell carcinoma of right lung [facility]) #1 Syncope: Suspect due to orthostatic hypotension  Cardiac enzymes negative Lactic acid normal Head CT negative CT angiogram negative for PE BP improved after normal saline bolus in ED Check orthostatic vitals No significant arrhythmia noted on cardiac monitoring Stop Metoprolol for now 2D echocardiogram-pending   #2 hypokalemia: Mild Potassium repletion   #3 COPD: Stable Continue as needed Xopenex  #4 anxiety: Continue as needed Xanax  #5  lung cancer: Status post chemotherapy and radiation therapy Continue oncology follow-up        Code Status: Full code DVT Prophylaxis:  Lovenox  Family Communication: Discussed in detail with the patient, all imaging results, lab results explained to the patient or *   Disposition Plan: Home  Time Spent in minutes   35 minutes  Procedures:    Consultants:     Antimicrobials:      Medications  Scheduled Meds: . ALPRAZolam  1 mg Oral TID  . calcium-vitamin D  1 tablet Oral Daily  . clopidogrel  75 mg Oral Daily  . enoxaparin (LOVENOX) injection  40 mg Subcutaneous Q24H  . isosorbide mononitrate  30 mg Oral Daily  . metoprolol succinate  12.5 mg Oral Daily  . omega-3 acid ethyl esters  1 g Oral Daily  . pantoprazole  40 mg Oral Daily  . potassium chloride  40 mEq Oral BID WC  . pravastatin  40 mg Oral q1800  . sodium chloride flush  3 mL Intravenous Q12H  . tiotropium  18 mcg Inhalation Daily  Continuous Infusions:  PRN Meds:.acetaminophen **OR** acetaminophen, HYDROcodone-acetaminophen, levalbuterol, ondansetron **OR** ondansetron (ZOFRAN) IV, senna-docusate   Antibiotics   Anti-infectives (From admission, onward)   None        Subjective:   [name] was seen and examined today.  She is a little better today but continues to feel dizzy and continued feeling weak denies any fever or chills Objective:   Vitals:   08/30/17 0348 08/30/17 0848 08/30/17 1018 08/30/17 1330  BP: 132/64  (!) 124/57 (!) 104/49  Pulse: 65  (!) 58 75  Resp: 18   18  Temp: 98.4 F (36.9 C)   97.9 F (36.6 C)  TempSrc: Oral   Oral  SpO2: 96% 96%  97%  Weight: 52.2 kg (115 lb 1.3 oz)     Height:        Intake/Output Summary (Last 24 hours) at 08/30/2017 1417 Last data filed at 08/30/2017 1331 Gross per 24 hour  Intake 1225.5 ml  Output -  Net 1225.5 ml     Wt Readings from Last 3 Encounters:  08/30/17 52.2 kg (115 lb 1.3 oz)  08/13/17 52.3 kg (115 lb 3.2  oz)  08/12/17 52.4 kg (115 lb 9.6 oz)     Exam  General: NAD  HEENT: NCAT,  PERRL,MMM  Neck: SUPPLE, (-) JVD  Cardiovascular: RRR, (-) GALLOP, (-) MURMUR  Respiratory: CTA  Gastrointestinal: SOFT, (-) DISTENSION, BS(+), (_) TENDERNESS  Ext: (-) CYANOSIS, (-) EDEMA  Neuro: A, OX 3  Skin:(-) RASH  Psych:NORMAL AFFECT/MOOD   Data Reviewed:  I have personally reviewed following labs and imaging studies  Micro Results No results found for this or any previous visit (from the past 240 hour(s)).  Radiology Reports Ct Head Wo Contrast  Result Date: 08/29/2017 CLINICAL DATA:  Multiple syncopal episodes. EXAM: CT HEAD WITHOUT CONTRAST TECHNIQUE: Contiguous axial images were obtained from the base of the skull through the vertex without intravenous contrast. COMPARISON:  Brain MRI 05/15/2017 FINDINGS: Brain: No mass lesion, intraparenchymal hemorrhage or extra-axial collection. No evidence of acute cortical infarct. Normal appearance of the brain parenchyma and extra axial spaces for age. Vascular: No hyperdense vessel or unexpected vascular calcification. Skull: Normal visualized skull base, calvarium and extracranial soft tissues. Sinuses/Orbits: No sinus fluid levels or advanced mucosal thickening. No mastoid effusion. Normal orbits. IMPRESSION: Normal head CT. Electronically Signed   By: Ulyses Jarred M.D.   On: 08/29/2017 17:51   Ct Angio Chest Pe W And/or Wo Contrast  Result Date: 08/29/2017 CLINICAL DATA:  Shortness of breath, syncope. History of lung cancer. EXAM: CT ANGIOGRAPHY CHEST WITH CONTRAST TECHNIQUE: Multidetector CT imaging of the chest was performed using the standard protocol during bolus administration of intravenous contrast. Multiplanar CT image reconstructions and MIPs were obtained to evaluate the vascular anatomy. CONTRAST:  70 mL ISOVUE-370 IOPAMIDOL (ISOVUE-370) INJECTION 76% COMPARISON:  PET scan of February 26, 2017. CT scan of November 26, 2016. FINDINGS:  Cardiovascular: Satisfactory opacification of the pulmonary arteries to the segmental level. No evidence of pulmonary embolism. Normal heart size. No pericardial effusion. Atherosclerosis of thoracic aorta is noted without aneurysm formation. Mediastinum/Nodes: No enlarged mediastinal, hilar, or axillary lymph nodes. Thyroid gland, trachea, and esophagus demonstrate no significant findings. Lungs/Pleura: Severe emphysematous disease is noted in the upper lobes. No pneumothorax or pleural effusion is noted. No acute pulmonary disease is noted. Upper Abdomen: No acute abnormality. Musculoskeletal: No chest wall abnormality. No acute or significant osseous findings. Review of the MIP images confirms the above findings.  IMPRESSION: No definite evidence of pulmonary embolus. Aortic Atherosclerosis (ICD10-I70.0) and Emphysema (ICD10-J43.9). Electronically Signed   By: Marijo Conception, M.D.   On: 08/29/2017 17:58    Lab Data:  CBC: Recent Labs  Lab 08/29/17 1221  WBC 6.7  HGB 14.0  HCT 42.1  MCV 94.8  PLT 382   Basic Metabolic Panel: Recent Labs  Lab 08/29/17 1221 08/30/17 0255  NA 140 141  K 4.0 3.4*  CL 108 112*  CO2 24 24  GLUCOSE 134* 85  BUN 9 7  CREATININE 0.52 0.52  CALCIUM 9.8 8.8*   GFR: Estimated Creatinine Clearance: 59.3 mL/min (by C-G formula based on SCr of 0.52 mg/dL). Liver Function Tests: No results for input(s): AST, ALT, ALKPHOS, BILITOT, PROT, ALBUMIN in the last 168 hours. No results for input(s): LIPASE, AMYLASE in the last 168 hours. No results for input(s): AMMONIA in the last 168 hours. Coagulation Profile: No results for input(s): INR, PROTIME in the last 168 hours. Cardiac Enzymes: No results for input(s): CKTOTAL, CKMB, CKMBINDEX, TROPONINI in the last 168 hours. BNP (last 3 results) No results for input(s): PROBNP in the last 8760 hours. HbA1C: No results for input(s): HGBA1C in the last 72 hours. CBG: Recent Labs  Lab 08/30/17 0756  GLUCAP 87    Lipid Profile: No results for input(s): CHOL, HDL, LDLCALC, TRIG, CHOLHDL, LDLDIRECT in the last 72 hours. Thyroid Function Tests: No results for input(s): TSH, T4TOTAL, FREET4, T3FREE, THYROIDAB in the last 72 hours. Anemia Panel: No results for input(s): VITAMINB12, FOLATE, FERRITIN, TIBC, IRON, RETICCTPCT in the last 72 hours. Urine analysis:    Component Value Date/Time   COLORURINE STRAW (A) 08/29/2017 1228   APPEARANCEUR CLEAR 08/29/2017 1228   LABSPEC 1.005 08/29/2017 1228   LABSPEC 1.005 03/08/2016 0958   PHURINE 6.0 08/29/2017 1228   GLUCOSEU NEGATIVE 08/29/2017 1228   GLUCOSEU NEGATIVE 06/17/2017 1113   GLUCOSEU Negative 03/08/2016 0958   HGBUR SMALL (A) 08/29/2017 1228   HGBUR negative 06/20/2010 1357   BILIRUBINUR NEGATIVE 08/29/2017 1228   BILIRUBINUR Negative 03/08/2016 [place] 08/29/2017 1228   PROTEINUR NEGATIVE 08/29/2017 1228   UROBILINOGEN 0.2 06/17/2017 1113   UROBILINOGEN 0.2 03/08/2016 0958   NITRITE NEGATIVE 08/29/2017 1228   LEUKOCYTESUR NEGATIVE 08/29/2017 1228   LEUKOCYTESUR Negative 03/08/2016 0958     Benito Mccreedy M.D. [facility] 08/30/2017, 2:17 PM  Pager: [phone] Between 7am to 7pm - call Pager - [phone]  After 7pm go to [email or web address] - password [email or web address]  Call night coverage person covering after 7pm

## 2017-08-30 NOTE — Progress Notes (Signed)
  Echocardiogram 2D Echocardiogram has been performed.  Michele [name] 08/30/2017, 3:58 PM

## 2017-08-30 NOTE — Progress Notes (Signed)
Pt was concerned about the swelling in her R hand has been going on for about a month, her mother had a hx of CHF, will continue to monitor, Thanks [facility] [place].

## 2017-08-30 NOTE — Care Management Obs Status (Signed)
[facility] NOTIFICATION   Patient Details  Name: Michele [name] MRN: [id number] Date of Birth: 20-Oct-1955   Medicare Observation Status Notification Given:  Yes    Bethena Roys, RN 08/30/2017, 4:05 PM

## 2017-08-31 DIAGNOSIS — R55 Syncope and collapse: Secondary | ICD-10-CM | POA: Diagnosis not present

## 2017-08-31 LAB — BASIC METABOLIC PANEL
Anion gap: 6 (ref 5–15)
BUN: 9 mg/dL (ref 6–20)
CO2: 21 mmol/L — ABNORMAL LOW (ref 22–32)
Calcium: 8.5 mg/dL — ABNORMAL LOW (ref 8.9–10.3)
Chloride: 114 mmol/L — ABNORMAL HIGH (ref 101–111)
Creatinine, Ser: 0.61 mg/dL (ref 0.44–1.00)
GFR calc Af Amer: >60 mL/min (ref >60)
GFR calc non Af Amer: >60 mL/min (ref >60)
Glucose, Bld: 104 mg/dL — ABNORMAL HIGH (ref 65–99)
Potassium: 4.1 mmol/L (ref 3.5–5.1)
Sodium: 141 mmol/L (ref 135–145)

## 2017-08-31 LAB — GLUCOSE, CAPILLARY: Glucose-Capillary: 112 mg/dL — ABNORMAL HIGH (ref 65–99)

## 2017-08-31 NOTE — Plan of Care (Signed)
  Problem: Nutrition: Goal: Adequate nutrition will be maintained Outcome: Completed/Met   Problem: Pain Managment: Goal: General experience of comfort will improve Outcome: Completed/Met

## 2017-08-31 NOTE — Progress Notes (Signed)
Call placed to [facility] to notify of telemetry monitoring d/c.   

## 2017-08-31 NOTE — Progress Notes (Signed)
MD at bedside. 

## 2017-08-31 NOTE — Telephone Encounter (Signed)
If I had to guess anything, I would suspect that these lower heart blood pressure high heart rate episodes were related to dehydration.  I doubt very seriously that that she syncopized with blood pressures of 104/66.  I suspect what happens is that she tries to get up at night and her blood pressure is low.  We will await the results of her hospital stay.   Glenetta Hew, MD

## 2017-08-31 NOTE — Progress Notes (Signed)
This mornings potassium dose was cancelled per Dr Vista Lawman.
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
## 2017-08-31 NOTE — Progress Notes (Addendum)
Patient resting comfortably during shift report. Denies complaints.   Page sent to MD by night RN in regard to K level and current K dose ordered. Dose bumped to 10 am med pass until response received.

## 2017-09-01 ENCOUNTER — Inpatient Hospital Stay: Payer: Medicare Other | Attending: Internal Medicine

## 2017-09-01 ENCOUNTER — Ambulatory Visit (HOSPITAL_COMMUNITY): Admission: RE | Admit: 2017-09-01 | Payer: Medicare Other | Source: Ambulatory Visit

## 2017-09-01 ENCOUNTER — Ambulatory Visit: Payer: Self-pay | Admitting: Obstetrics & Gynecology

## 2017-09-01 DIAGNOSIS — Z923 Personal history of irradiation: Secondary | ICD-10-CM | POA: Insufficient documentation

## 2017-09-01 DIAGNOSIS — Z85118 Personal history of other malignant neoplasm of bronchus and lung: Secondary | ICD-10-CM | POA: Insufficient documentation

## 2017-09-01 DIAGNOSIS — Z9221 Personal history of antineoplastic chemotherapy: Secondary | ICD-10-CM | POA: Insufficient documentation

## 2017-09-01 DIAGNOSIS — I959 Hypotension, unspecified: Secondary | ICD-10-CM | POA: Insufficient documentation

## 2017-09-01 DIAGNOSIS — C3491 Malignant neoplasm of unspecified part of right bronchus or lung: Secondary | ICD-10-CM

## 2017-09-01 DIAGNOSIS — R000 Tachycardia, unspecified: Secondary | ICD-10-CM | POA: Diagnosis not present

## 2017-09-01 LAB — CBC WITH DIFFERENTIAL/PLATELET
Basophils Absolute: 0 10*3/uL (ref 0.0–0.1)
Basophils Relative: 0 %
Eosinophils Absolute: 0.1 10*3/uL (ref 0.0–0.5)
Eosinophils Relative: 1 %
HCT: 44.5 % (ref 34.8–46.6)
Hemoglobin: 14.7 g/dL (ref 11.6–15.9)
Lymphocytes Relative: 10 %
Lymphs Abs: 0.5 10*3/uL — ABNORMAL LOW (ref 0.9–3.3)
MCH: 31.7 pg (ref 25.1–34.0)
MCHC: 33 g/dL (ref 31.5–36.0)
MCV: 95.9 fL (ref 79.5–101.0)
Monocytes Absolute: 0.3 10*3/uL (ref 0.1–0.9)
Monocytes Relative: 5 %
Neutro Abs: 4.4 10*3/uL (ref 1.5–6.5)
Neutrophils Relative %: 84 %
Platelets: 147 10*3/uL (ref 145–400)
RBC: 4.64 MIL/uL (ref 3.70–5.45)
RDW: 13.1 % (ref 11.2–14.5)
WBC: 5.3 10*3/uL (ref 3.9–10.3)

## 2017-09-01 LAB — COMPREHENSIVE METABOLIC PANEL
ALT: 11 U/L (ref 0–55)
AST: 15 U/L (ref 5–34)
Albumin: 4 g/dL (ref 3.5–5.0)
Alkaline Phosphatase: 70 U/L (ref 40–150)
Anion gap: 6 (ref 3–11)
BUN: 8 mg/dL (ref 7–26)
CO2: 27 mmol/L (ref 22–29)
Calcium: 10.5 mg/dL — ABNORMAL HIGH (ref 8.4–10.4)
Chloride: 109 mmol/L (ref 98–109)
Creatinine, Ser: 0.71 mg/dL (ref 0.60–1.10)
GFR calc Af Amer: >60 mL/min (ref >60)
GFR calc non Af Amer: >60 mL/min (ref >60)
Glucose, Bld: 94 mg/dL (ref 70–140)
Potassium: 5.2 mmol/L — ABNORMAL HIGH (ref 3.5–5.1)
Sodium: 142 mmol/L (ref 136–145)
Total Bilirubin: 0.3 mg/dL (ref 0.2–1.2)
Total Protein: 6.6 g/dL (ref 6.4–8.3)

## 2017-09-02 ENCOUNTER — Telehealth: Payer: Self-pay | Admitting: Cardiology

## 2017-09-02 NOTE — Telephone Encounter (Signed)
Spoke with pt, she was discharged from the hospital Sunday, she continues off the metoprolol. Her bp is ranging from 156-101/84-69 with pulse 114-79. Her follow up appointment was not until 09-25-17, appointment moved up to next week with the extender.

## 2017-09-02 NOTE — Telephone Encounter (Signed)
New Message    Pt c/o Syncope: STAT if syncope occurred within 30 minutes and pt complains of lightheadedness High Priority if episode of passing out, completely, today or in last 24 hours   1. Did you pass out today? no 2. When is the last time you passed out?  Saturday 08/30/17  3. Has this occurred multiple times?  Yes passed out 3 times this weekend   4. Did you have any symptoms prior to passing out?  Did not state , pt said the ER stopped her medication due to bp being low

## 2017-09-02 NOTE — Telephone Encounter (Signed)
New message   Patient calling with concerns about being off Metoprolol. Please call

## 2017-09-03 ENCOUNTER — Inpatient Hospital Stay (HOSPITAL_BASED_OUTPATIENT_CLINIC_OR_DEPARTMENT_OTHER): Payer: Medicare Other | Admitting: Internal Medicine

## 2017-09-03 ENCOUNTER — Encounter: Payer: Self-pay | Admitting: Internal Medicine

## 2017-09-03 ENCOUNTER — Telehealth: Payer: Self-pay | Admitting: Internal Medicine

## 2017-09-03 VITALS — BP 120/62 | HR 83 | Temp 97.7°F | Resp 18 | Ht 66.0 in | Wt 116.1 lb

## 2017-09-03 DIAGNOSIS — C3491 Malignant neoplasm of unspecified part of right bronchus or lung: Secondary | ICD-10-CM

## 2017-09-03 DIAGNOSIS — Z9221 Personal history of antineoplastic chemotherapy: Secondary | ICD-10-CM

## 2017-09-03 DIAGNOSIS — Z85118 Personal history of other malignant neoplasm of bronchus and lung: Secondary | ICD-10-CM | POA: Diagnosis not present

## 2017-09-03 DIAGNOSIS — I959 Hypotension, unspecified: Secondary | ICD-10-CM

## 2017-09-03 DIAGNOSIS — R000 Tachycardia, unspecified: Secondary | ICD-10-CM

## 2017-09-03 DIAGNOSIS — Z923 Personal history of irradiation: Secondary | ICD-10-CM
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
## 2017-09-03 NOTE — Telephone Encounter (Signed)
Scheduled appt per 4/3 los - gave patient AVS and calender per los. - Central radiology to contact patient with scan.

## 2017-09-05 ENCOUNTER — Encounter: Payer: Self-pay | Admitting: Internal Medicine

## 2017-09-05 DIAGNOSIS — R002 Palpitations: Secondary | ICD-10-CM | POA: Diagnosis not present

## 2017-09-05 DIAGNOSIS — R55 Syncope and collapse: Secondary | ICD-10-CM | POA: Diagnosis not present

## 2017-09-09 ENCOUNTER — Ambulatory Visit (INDEPENDENT_AMBULATORY_CARE_PROVIDER_SITE_OTHER): Payer: Medicare Other | Admitting: Cardiology

## 2017-09-09 ENCOUNTER — Encounter: Payer: Self-pay | Admitting: Cardiology

## 2017-09-09 VITALS — BP 110/62 | HR 75 | Ht 65.0 in | Wt 117.0 lb

## 2017-09-09 DIAGNOSIS — I208 Other forms of angina pectoris: Secondary | ICD-10-CM

## 2017-09-09 DIAGNOSIS — R002 Palpitations: Secondary | ICD-10-CM | POA: Diagnosis not present

## 2017-09-09 DIAGNOSIS — I25119 Atherosclerotic heart disease of native coronary artery with unspecified angina pectoris: Secondary | ICD-10-CM

## 2017-09-09 DIAGNOSIS — I951 Orthostatic hypotension: Secondary | ICD-10-CM | POA: Insufficient documentation

## 2017-09-09 DIAGNOSIS — C3491 Malignant neoplasm of unspecified part of right bronchus or lung: Secondary | ICD-10-CM | POA: Diagnosis not present

## 2017-09-09 DIAGNOSIS — F411 Generalized anxiety disorder: Secondary | ICD-10-CM

## 2017-09-09 NOTE — Patient Instructions (Signed)
NO MEDICATION CHANGES     Your physician recommends that you schedule a follow-up appointment in [place].   If you need a refill on your cardiac medications before your next appointment, please call your pharmacy.

## 2017-09-09 NOTE — Assessment & Plan Note (Signed)
Followed by Dr Julien Nordmann

## 2017-09-09 NOTE — Assessment & Plan Note (Signed)
She actually seems pretty stable today. Long discussion trying to explain medication indications and B/P interaction

## 2017-09-09 NOTE — Assessment & Plan Note (Signed)
This is currently stable. It's hard to imagine a low dose of Metoprolol caused this but she is not orthostatic now.

## 2017-09-09 NOTE — Assessment & Plan Note (Signed)
See Dr Allison Quarry notes- stable
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
## 2017-09-11 ENCOUNTER — Other Ambulatory Visit: Payer: Self-pay

## 2017-09-16 ENCOUNTER — Telehealth: Payer: Self-pay | Admitting: Cardiology

## 2017-09-16 ENCOUNTER — Encounter: Payer: Self-pay | Admitting: Cardiology

## 2017-09-16 NOTE — Telephone Encounter (Signed)
Patient notified of MD recommendations/advice

## 2017-09-16 NOTE — Telephone Encounter (Signed)
Called patient in regards to MyChart message where she was asking for EKG results from 09/09/17. Explained that results will not show in her MyChart like labs, echos etc do b/c the EKG is "scanned" in. Explained that EKG from this date showed SR with HR of 75 per PA note.   She also c/o swelling in her fingers every night and when she wakes up. She has some pedal edema as well which she said is an acute change, but mentioned this at her last visit. She does not think she has gained weigh but has no scale. Her last EF was normal. She thinks she may have arthritis/neuropathy.   Informed patient I would notify MD of her concerns and we would follow back up with her

## 2017-09-16 NOTE — Telephone Encounter (Signed)
Sounds like OA & not cardiac.  Glenetta Hew, MD

## 2017-09-17 ENCOUNTER — Ambulatory Visit (INDEPENDENT_AMBULATORY_CARE_PROVIDER_SITE_OTHER): Payer: Medicare Other | Admitting: Obstetrics & Gynecology

## 2017-09-17 ENCOUNTER — Other Ambulatory Visit (HOSPITAL_COMMUNITY)
Admission: RE | Admit: 2017-09-17 | Discharge: 2017-09-17 | Disposition: A | Discharge status: Home / Self Care | Payer: Medicare Other | Source: Ambulatory Visit | Attending: Obstetrics & Gynecology | Admitting: Obstetrics & Gynecology

## 2017-09-17 VITALS — BP 132/76 | HR 92 | Ht 65.5 in | Wt 117.0 lb

## 2017-09-17 DIAGNOSIS — R102 Pelvic and perineal pain: Secondary | ICD-10-CM

## 2017-09-17 DIAGNOSIS — F32A Depression, unspecified: Secondary | ICD-10-CM

## 2017-09-17 DIAGNOSIS — Z01419 Encounter for gynecological examination (general) (routine) without abnormal findings: Secondary | ICD-10-CM

## 2017-09-17 DIAGNOSIS — F329 Major depressive disorder, single episode, unspecified: Secondary | ICD-10-CM
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
## 2017-09-17 NOTE — Progress Notes (Signed)
Ms. Mcglade presents for follow up of radiation completed to 05/06/16 to her Mediastinum and whole brain prophylactic radiation completed 07/08/16. She saw Dr. Mickeal Skinner 06/12/17 for evaluation of headaches. She last saw Dr. Julien Nordmann on 09/03/17 and his currently on observation and will have repeat CT Chest in 6 months. She had a MRI Brain 09/18/17 and is here to discuss the results with Dr. Isidore Moos. She reports daily headaches. She reports daily eye pain. She is not sleeping well even after taking xanax. She reports fatigue, weakness. She has decreased hearing and often feels "unbalanced". She reports a rash to her head that is very itchy and keeps her up at night. She reports eating frozen vegetables and healthy items.   BP 130/61   Pulse 77   Temp 97.8 F (36.6 C)   Resp 20   Ht 5' 5.5" (1.664 m)   Wt 120 lb 6.4 oz (54.6 kg)   SpO2 100% Comment: room air  BMI 19.73 kg/m    Wt Readings from Last 3 Encounters:  09/23/17 120 lb 6.4 oz (54.6 kg)  09/17/17 117 lb (53.1 kg)  09/09/17 117 lb (53.1 kg)

## 2017-09-18 ENCOUNTER — Ambulatory Visit
Admission: RE | Admit: 2017-09-18 | Discharge: 2017-09-18 | Disposition: A | Discharge status: Home / Self Care | Payer: Medicare Other | Source: Ambulatory Visit | Attending: Radiation Oncology | Admitting: Radiation Oncology

## 2017-09-18 DIAGNOSIS — C7949 Secondary malignant neoplasm of other parts of nervous system: Secondary | ICD-10-CM

## 2017-09-18 DIAGNOSIS — C3490 Malignant neoplasm of unspecified part of unspecified bronchus or lung: Secondary | ICD-10-CM | POA: Diagnosis not present

## 2017-09-18 DIAGNOSIS — C7931 Secondary malignant neoplasm of brain: Secondary | ICD-10-CM

## 2017-09-18 MED ORDER — GADOBENATE DIMEGLUMINE 529 MG/ML IV SOLN
10.0000 mL | Freq: Once | INTRAVENOUS | Status: AC | PRN
  Administered 2017-09-18: 10 mL via INTRAVENOUS

## 2017-09-22 LAB — CYTOLOGY - PAP
Bacterial vaginitis: NEGATIVE
Candida vaginitis: NEGATIVE
Chlamydia: NEGATIVE
Diagnosis: NEGATIVE
HPV: NOT DETECTED
Neisseria Gonorrhea: NEGATIVE
Trichomonas: NEGATIVE

## 2017-09-23 ENCOUNTER — Telehealth: Payer: Self-pay | Admitting: *Deleted

## 2017-09-23 ENCOUNTER — Encounter: Payer: Self-pay | Admitting: Radiation Oncology

## 2017-09-23 ENCOUNTER — Ambulatory Visit
Admission: RE | Admit: 2017-09-23 | Discharge: 2017-09-23 | Disposition: A | Discharge status: Home / Self Care | Payer: Medicare Other | Source: Ambulatory Visit | Attending: Radiation Oncology | Admitting: Radiation Oncology

## 2017-09-23 ENCOUNTER — Other Ambulatory Visit: Payer: Self-pay

## 2017-09-23 VITALS — BP 130/61 | HR 77 | Temp 97.8°F | Resp 20 | Ht 65.5 in | Wt 120.4 lb

## 2017-09-23 DIAGNOSIS — I700 Atherosclerosis of aorta: Secondary | ICD-10-CM | POA: Diagnosis not present

## 2017-09-23 DIAGNOSIS — Z923 Personal history of irradiation: Secondary | ICD-10-CM | POA: Insufficient documentation

## 2017-09-23 DIAGNOSIS — L299 Pruritus, unspecified: Secondary | ICD-10-CM | POA: Diagnosis not present

## 2017-09-23 DIAGNOSIS — R55 Syncope and collapse: Secondary | ICD-10-CM | POA: Diagnosis not present

## 2017-09-23 DIAGNOSIS — G4700 Insomnia, unspecified: Secondary | ICD-10-CM | POA: Diagnosis not present

## 2017-09-23 DIAGNOSIS — J439 Emphysema, unspecified: Secondary | ICD-10-CM | POA: Insufficient documentation

## 2017-09-23 DIAGNOSIS — R21 Rash and other nonspecific skin eruption: Secondary | ICD-10-CM | POA: Insufficient documentation

## 2017-09-23 DIAGNOSIS — C771 Secondary and unspecified malignant neoplasm of intrathoracic lymph nodes: Secondary | ICD-10-CM

## 2017-09-23 DIAGNOSIS — R51 Headache: Secondary | ICD-10-CM | POA: Insufficient documentation

## 2017-09-23 DIAGNOSIS — Z8579 Personal history of other malignant neoplasms of lymphoid, hematopoietic and related tissues: Secondary | ICD-10-CM | POA: Diagnosis not present

## 2017-09-23 DIAGNOSIS — Z79899 Other long term (current) drug therapy: Secondary | ICD-10-CM | POA: Diagnosis not present

## 2017-09-23 DIAGNOSIS — Z85118 Personal history of other malignant neoplasm of bronchus and lung: Secondary | ICD-10-CM | POA: Diagnosis not present

## 2017-09-23 DIAGNOSIS — Z08 Encounter for follow-up examination after completed treatment for malignant neoplasm: Secondary | ICD-10-CM | POA: Diagnosis not present

## 2017-09-23 NOTE — Telephone Encounter (Signed)
Twenty minute call with patient with questions in reference to "I was to be scheduled for a CT scan of my chest that has not ben done.  What CT scan results were used for my F/U visit this month?  Was the hospital scan the same scan oncology would have needed and ordered?  Dr. Isidore Moos said I'm cancer free is why I'm wondering.  Am I cancer free?  What were my lab results?"  Nurse answered questions of CT angiogram performed with hospitalization also covered CT scan ordered by provider.

## 2017-09-23 NOTE — Progress Notes (Signed)
Radiation Oncology         [phone] ________________________________  Name: Michele [name] MRN: [id number]  Date: 09/23/2017  DOB: Oct 20, 1955  Follow-Up Visit Note  Outpatient  CC: [name], MD  Curt Bears, MD  Diagnosis and Prior Radiotherapy:    ICD-10-CM   1. 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   Narrative:  The patient returns today for routine follow-up of radiation completed 07/08/16.   She met with Dr. Mickeal Skinner in neurology on 06/12/17 for headaches. Not thought to be migrainous.  There is no structural CNS deficit, neurologic exam is normal. Encouraged to make lifestyle changes.  MRI brain performed on 09/18/17 showed continued satisfactory post treatment appearance of the brain. No metastatic disease or acute intracranial abnormality was appreciated.    Ct angiogram of chest on 08/29/17 for SOB and syncope.This showed no definite PE. It did show severe emphysema in the upper lobes.No enlarged lymph nodes. No acute pulmonary disease.  I personally reviewed her imaging.  On review of systems, the patient reports daily headaches, daily eye pain, and insomnia. Xanax does not help adequately with sleep difficulties. She endorses fatigue, weakness, decreased hearing, and imbalance. She also reports of a rash on her head that is very itchy and impedes sleep. She states it feels like something is "crawling in her head."  ALLERGIES:  is allergic to ciprofloxacin; aspirin; crestor [rosuvastatin]; ibuprofen; wellbutrin [bupropion]; zoloft [sertraline hcl]; albuterol; lipitor [atorvastatin];  and sulfonamide derivatives.  Meds: Current Outpatient Medications  Medication Sig Dispense Refill  . ALPRAZolam (XANAX) 1 MG tablet Take 1 mg by mouth 3 (three) times daily.     . Calcium Carbonate-Vitamin D (CALCIUM 600+D) 600-400 MG-UNIT tablet Take 1 tablet by mouth daily.    . clopidogrel (PLAVIX) 75 MG tablet TAKE 1 TABLET(75 MG) BY MOUTH DAILY (Patient taking differently: Take 75 mg by mouth once a day) 90 tablet 2  . Coenzyme Q10 (COQ10) 100 MG CAPS Take 100 mg by mouth daily.     . isosorbide mononitrate (IMDUR) 30 MG 24 hr tablet TAKE 1 TABLET(30 MG) BY MOUTH AT BEDTIME (Patient taking differently: Take 30 mg by mouth at bedtime) 90 tablet 3  . levalbuterol (XOPENEX HFA) 45 MCG/ACT inhaler Inhale 1-2 puffs into the lungs every 6 (six) hours as needed for wheezing. 1 Inhaler 12  . mometasone (ELOCON) 0.1 % cream Apply 1 application topically See admin instructions. aaa qd  0  . Multiple Vitamin (MULTIVITAMIN WITH MINERALS) TABS tablet Take 2 tablets by mouth daily.    . pantoprazole (PROTONIX) 40 MG tablet Take 40 mg by mouth daily.    . phenylephrine-shark liver oil-mineral oil-petrolatum (PREPARATION H) 0.25-3-14-71.9 % rectal ointment Place 1 application rectally 2 (two) times daily as needed for hemorrhoids.    . pravastatin (PRAVACHOL) 40 MG tablet TAKE 1 TABLET(40 MG) BY MOUTH EVERY EVENING (Patient taking differently: Take 40 mg by mouth in the evening) 90 tablet 3  . saccharomyces boulardii (FLORASTOR) 250 MG capsule Take 500 mg by mouth daily.    [name] tiotropium (SPIRIVA) 18 MCG inhalation capsule Place 18  mcg into inhaler and inhale daily.    [name] VASCEPA 1 g CAPS TAKE 1 CAPSULES BY MOUTH TWICE DAILY (Patient taking differently: Take 1 gram by mouth once a day) 60 capsule 6  . nitroGLYCERIN (NITROSTAT) 0.4 MG SL tablet DISSOLVE 1 TABLET UNDER THE TONGUE EVERY 5 MINUTES AS NEEDED FOR CHEST PAIN (Patient not taking: Reported on 09/23/2017) 25 tablet 4   No current facility-administered  medications for this encounter.    Facility-Administered Medications Ordered in Other Encounters  Medication Dose Route Frequency Provider Last Rate Last Dose  . HYDROcodone-acetaminophen (NORCO/VICODIN) 5-325 MG per tablet 1 tablet  1 tablet Oral Once Susanne Borders, NP        Physical Findings:    height is 5' 5.5" (1.664 m) and weight is 120 lb 6.4 oz (54.6 kg). Her temperature is 97.8 F (36.6 C). Her blood pressure is 130/61 and her pulse is 77. Her respiration is 20 and oxygen saturation is 100%.  General: Alert and oriented, in no acute distress HEENT: Head is normocephalic. Extraocular movements are intact. Oropharynx and mucous membranes are clear. Neck: Neck is supple, no palpable cervical or supraclavicular lymphadenopathy.   Heart: Regular in rate and rhythm with no murmurs, rubs, or gallops. Chest: Clear to auscultation bilaterally, with no rhonchi, wheezes, or rales. Extremities: No cyanosis or edema. Abdomen: Soft but slightly tender throughout, no guarding. Lymphatics: see Neck Exam Musculoskeletal: symmetric strength and muscle tone throughout. Neurologic: Cranial nerves II through XII are grossly intact. No obvious focalities. Speech is fluent. Coordination is intact. Strength is symmetric. Finger to nose testing is normal bilaterally.  Psychiatric: Judgment and insight are intact. Blunted affect.   Lab Findings: Lab Results  Component Value Date   WBC 5.3 09/01/2017   HGB 14.7 09/01/2017   HCT 44.5 09/01/2017   MCV 95.9 09/01/2017   PLT 147 09/01/2017    Lab Results  Component Value Date   TSH 1.57 05/02/2017    CMP     Component Value Date/Time   NA 142 09/01/2017 0948   NA 143 02/26/2017 0944   K 5.2 (H) 09/01/2017 0948   K 3.8 02/26/2017 0944   CL 109 09/01/2017 0948   CO2 27 09/01/2017 0948   CO2 27 02/26/2017 0944   GLUCOSE 94 09/01/2017 0948   GLUCOSE 97 02/26/2017 0944   BUN 8 09/01/2017 0948   BUN 15.0 02/26/2017 0944   CREATININE 0.71  09/01/2017 0948   CREATININE 0.7 02/26/2017 0944   CALCIUM 10.5 (H) 09/01/2017 0948   CALCIUM 9.4 02/26/2017 0944   PROT 6.6 09/01/2017 0948   PROT 6.1 (L) 02/26/2017 0944   ALBUMIN 4.0 09/01/2017 0948   ALBUMIN 3.7 02/26/2017 0944   AST 15 09/01/2017 0948   AST 14 02/26/2017 0944   ALT 11 09/01/2017 0948   ALT 8 02/26/2017 0944   ALKPHOS 70 09/01/2017 0948   ALKPHOS 67 02/26/2017 0944   BILITOT 0.3 09/01/2017 0948   BILITOT 0.39 02/26/2017 0944   GFRNONAA >60 09/01/2017 0948   GFRAA >60 09/01/2017 0948    Radiographic Findings: Ct Head Wo Contrast  Result Date: 08/29/2017 CLINICAL DATA:  Multiple syncopal episodes. EXAM: CT HEAD WITHOUT CONTRAST TECHNIQUE: Contiguous axial images were obtained from the base of the skull through the vertex without intravenous contrast. COMPARISON:  Brain MRI 05/15/2017 FINDINGS: Brain: No mass lesion, intraparenchymal hemorrhage or extra-axial collection. No evidence of acute cortical infarct. Normal appearance of the brain parenchyma and extra axial spaces for age. Vascular:  No hyperdense vessel or unexpected vascular calcification. Skull: Normal visualized skull base, calvarium and extracranial soft tissues. Sinuses/Orbits: No sinus fluid levels or advanced mucosal thickening. No mastoid effusion. Normal orbits. IMPRESSION: Normal head CT. Electronically Signed   By: Ulyses Jarred M.D.   On: 08/29/2017 17:51   Ct Angio Chest Pe W And/or Wo Contrast  Result Date: 08/29/2017 CLINICAL DATA:  Shortness of breath, syncope. History of lung cancer. EXAM: CT ANGIOGRAPHY CHEST WITH CONTRAST TECHNIQUE: Multidetector CT imaging of the chest was performed using the standard protocol during bolus administration of intravenous contrast. Multiplanar CT image reconstructions and MIPs were obtained to evaluate the vascular anatomy. CONTRAST:  70 mL ISOVUE-370 IOPAMIDOL (ISOVUE-370) INJECTION 76% COMPARISON:  PET scan of February 26, 2017. CT scan of November 26, 2016.  FINDINGS: Cardiovascular: Satisfactory opacification of the pulmonary arteries to the segmental level. No evidence of pulmonary embolism. Normal heart size. No pericardial effusion. Atherosclerosis of thoracic aorta is noted without aneurysm formation. Mediastinum/Nodes: No enlarged mediastinal, hilar, or axillary lymph nodes. Thyroid gland, trachea, and esophagus demonstrate no significant findings. Lungs/Pleura: Severe emphysematous disease is noted in the upper lobes. No pneumothorax or pleural effusion is noted. No acute pulmonary disease is noted. Upper Abdomen: No acute abnormality. Musculoskeletal: No chest wall abnormality. No acute or significant osseous findings. Review of the MIP images confirms the above findings. IMPRESSION: No definite evidence of pulmonary embolus. Aortic Atherosclerosis (ICD10-I70.0) and Emphysema (ICD10-J43.9). Electronically Signed   By: Marijo Conception, M.D.   On: 08/29/2017 17:58   Mr Jeri Cos TK Contrast  Result Date: 09/18/2017 CLINICAL DATA:  62 year old female with small cell lung cancer status post prophylactic whole brain radiation performed in January and February 2018. Restaging. Subsequent encounter. EXAM: MRI HEAD WITHOUT AND WITH CONTRAST TECHNIQUE: Multiplanar, multiecho pulse sequences of the brain and surrounding structures were obtained without and with intravenous contrast. CONTRAST:  28m MULTIHANCE GADOBENATE DIMEGLUMINE 529 MG/ML IV SOLN COMPARISON:  Restaging brain MRIs 05/15/2017 and earlier. Noncontrast head CT 08/29/2017. FINDINGS: Brain: Cerebral volume appears stable since 2017. No abnormal enhancement identified. No midline shift, mass effect, or evidence of intracranial mass lesion. No dural thickening. No restricted diffusion to suggest acute infarction. No ventriculomegaly, extra-axial collection or acute intracranial hemorrhage. Cervicomedullary junction and pituitary are within normal limits. Indistinct periventricular and other small scattered  bilateral cerebral white matter foci of T2 and FLAIR hyperintensity do appear increased since 2017, but mild overall and are likely the sequelae of whole brain radiation. No other gray or white matter signal abnormality identified. No chronic cerebral blood products identified. Vascular: Major intracranial vascular flow voids are stable. The major dural venous sinuses are enhancing and appear patent. Skull and upper cervical spine: Negative visible cervical spine and spinal cord. Calvarium bone marrow signal appears stable since 2017 and within normal limits. Sinuses/Orbits: Stable and negative. Other: Visible internal auditory structures appear normal. Mastoid air cells remain well pneumatized aside from minimal fluid on the left. Negative scalp and face soft tissues. IMPRESSION: Continued satisfactory post treatment appearance of the brain. No metastatic disease or acute intracranial abnormality. Electronically Signed   By: HGenevie AnnM.D.   On: 09/18/2017 13:42    Impression/Plan: 62year old woman with limited stage IIIA T0N2M0 small cell lung cancer with secondary malignancy of mediastinal lymph nodes. No evidence of recurrence. We will repeat MRI of her brain in 6 months. F/u w/ me at that time.  Advised her to see dermatologist if PCP cannot address her  pruritis.  Advised her to see Dr Mickeal Skinner PRN neurologic symptoms, of unclear etiology, chronic.  CT of chest pending Med/onc scheduling.    Discussed nutritional needs, advancing diet to improve strength and energy. _____________________________________   [name], MD  This document serves as a record of services personally performed by [name], MD. It was created on his behalf by Reola Mosher, a trained medical scribe. The creation of this record is based on the scribe's personal observations and the provider's statements to them. This document has been checked and approved by the attending provider.

## 2017-09-24 ENCOUNTER — Telehealth: Payer: Self-pay | Admitting: General Practice

## 2017-09-24 ENCOUNTER — Other Ambulatory Visit: Payer: Self-pay | Admitting: Cardiology

## 2017-09-24 NOTE — Telephone Encounter (Signed)
Patient called and left message on nurse voicemail line stating she has a transvaginal appointment on 4/25 and wants to set up her ultrasound appt the same day. Patient states she has had a recent mammogram and her breasts have been sore since then. It was difficult to fully understand what the patient was calling in regards to- message wasn't clear. Called patient and she states she saw on her mammogram where she has dense breasts. Patient is concerned about this because she has cancer and wants to have an ultrasound of her breasts too because they have been sore since her mammogram. Patient states she already called and found out we don't do those here only the [facility] does. Reassured patient that recent mammogram was normal but if she has questions she can contact the breast center. Patient verbalized understanding & had no questions

## 2017-09-24 NOTE — Telephone Encounter (Signed)
REFILL 

## 2017-09-25 ENCOUNTER — Ambulatory Visit (HOSPITAL_COMMUNITY)
Admission: RE | Admit: 2017-09-25 | Discharge: 2017-09-25 | Disposition: A | Discharge status: Home / Self Care | Payer: Medicare Other | Source: Ambulatory Visit | Attending: Obstetrics & Gynecology | Admitting: Obstetrics & Gynecology

## 2017-09-25 ENCOUNTER — Ambulatory Visit: Payer: Self-pay | Admitting: Physician Assistant

## 2017-09-25 ENCOUNTER — Other Ambulatory Visit: Payer: Self-pay | Admitting: Pulmonary Disease

## 2017-09-25 DIAGNOSIS — R102 Pelvic and perineal pain: Secondary | ICD-10-CM | POA: Diagnosis not present

## 2017-09-25 MED ORDER — LEVALBUTEROL TARTRATE 45 MCG/ACT IN AERO: 1.0000 | INHALATION_SPRAY | Freq: Four times a day (QID) | RESPIRATORY_TRACT | 6 refills | Status: DC | PRN

## 2017-09-25 NOTE — Telephone Encounter (Signed)
Received refill request for Xopenex from [facility]. Rx for Xopenex has been sent to preferred pharmacy. Nothing further is needed.

## 2017-09-30 ENCOUNTER — Telehealth: Payer: Self-pay | Admitting: Gastroenterology

## 2017-09-30 ENCOUNTER — Telehealth: Payer: Self-pay | Admitting: Cardiology

## 2017-09-30 NOTE — Telephone Encounter (Signed)
Spoke with pt, okay given for patient to use medications for her hemorrhoids. Also suggested stool softeners.

## 2017-09-30 NOTE — Telephone Encounter (Signed)
The pt states she has had constipation lately and hemorrhoid history.  She drove home from the beach and noticed bright red blood in her underwear.  Per Dr Ardis Hughs last note   "62 y.o. female with external hemorrhoids, chronic abdominal pains  First she has her usual chronic abdominal pains which have eluded diagnosis.  She is very worried about the possibility that small cell lung cancer may have spread to her colon or intestines.  Let her know I think that is extremely unlikely.  She had a colonoscopy less than 2 years ago.  She has had several CAT scans and even a PET scan in 2018.  Nothing about her history or exam to me suggest that possibility and frankly I have never seen small cell lung cancer spread to the bowel.  I think her biggest issue is change in bowels, constipation resulting in some external hemorrhoid flare.  I encouraged over-the-counter topical ointment such as Preparation H once or twice daily and that she start fiber supplements as the biggest way to help prevent, shrink hemorrhoids."  We discussed the recommendations and she will call back if the bleeding does not resolve.

## 2017-09-30 NOTE — Telephone Encounter (Signed)
New Message   Pt c/o medication issue:  1. Name of Medication: Preparation H suppository and Procto-medhc 2.5 %  2. How are you currently taking this medication (dosage and times per day)? n/a  3. Are you having a reaction (difficulty breathing--STAT)? n/a  4. What is your medication issue? Pt states she has really bad hemorrhoids, says the bleeding can get so bad that her underwear gets soaked in blood and wants to know if the above medication is ok to take. Please call

## 2017-10-01 ENCOUNTER — Encounter: Payer: Self-pay | Admitting: Obstetrics & Gynecology

## 2017-10-02 ENCOUNTER — Telehealth: Payer: Self-pay | Admitting: Gastroenterology

## 2017-10-02 DIAGNOSIS — L821 Other seborrheic keratosis: Secondary | ICD-10-CM | POA: Diagnosis not present

## 2017-10-02 DIAGNOSIS — D2372 Other benign neoplasm of skin of left lower limb, including hip: Secondary | ICD-10-CM | POA: Diagnosis not present

## 2017-10-02 DIAGNOSIS — L0292 Furuncle, unspecified: Secondary | ICD-10-CM | POA: Diagnosis not present

## 2017-10-02 NOTE — Telephone Encounter (Signed)
The pt has been advised that if her bleeding is heavy she should be evaluated by ED.  She states it is not severe but continues.  She saw PCP today and was told to use prep h, she states she has and has not seen any change.  She was offered an appt with extender but declined and wanted to see Dr Ardis Hughs on Monday.  She was advised he does not have any availability until July.  She will think about it and call back.

## 2017-10-03 IMAGING — DX DG CHEST 2V
2 series · 2 of 2 positions shown · non-contrast
Comparison: 03/03/2015

CLINICAL DATA: Left-sided chest pain

EXAM:
CHEST  2 VIEW

[w chest pa]
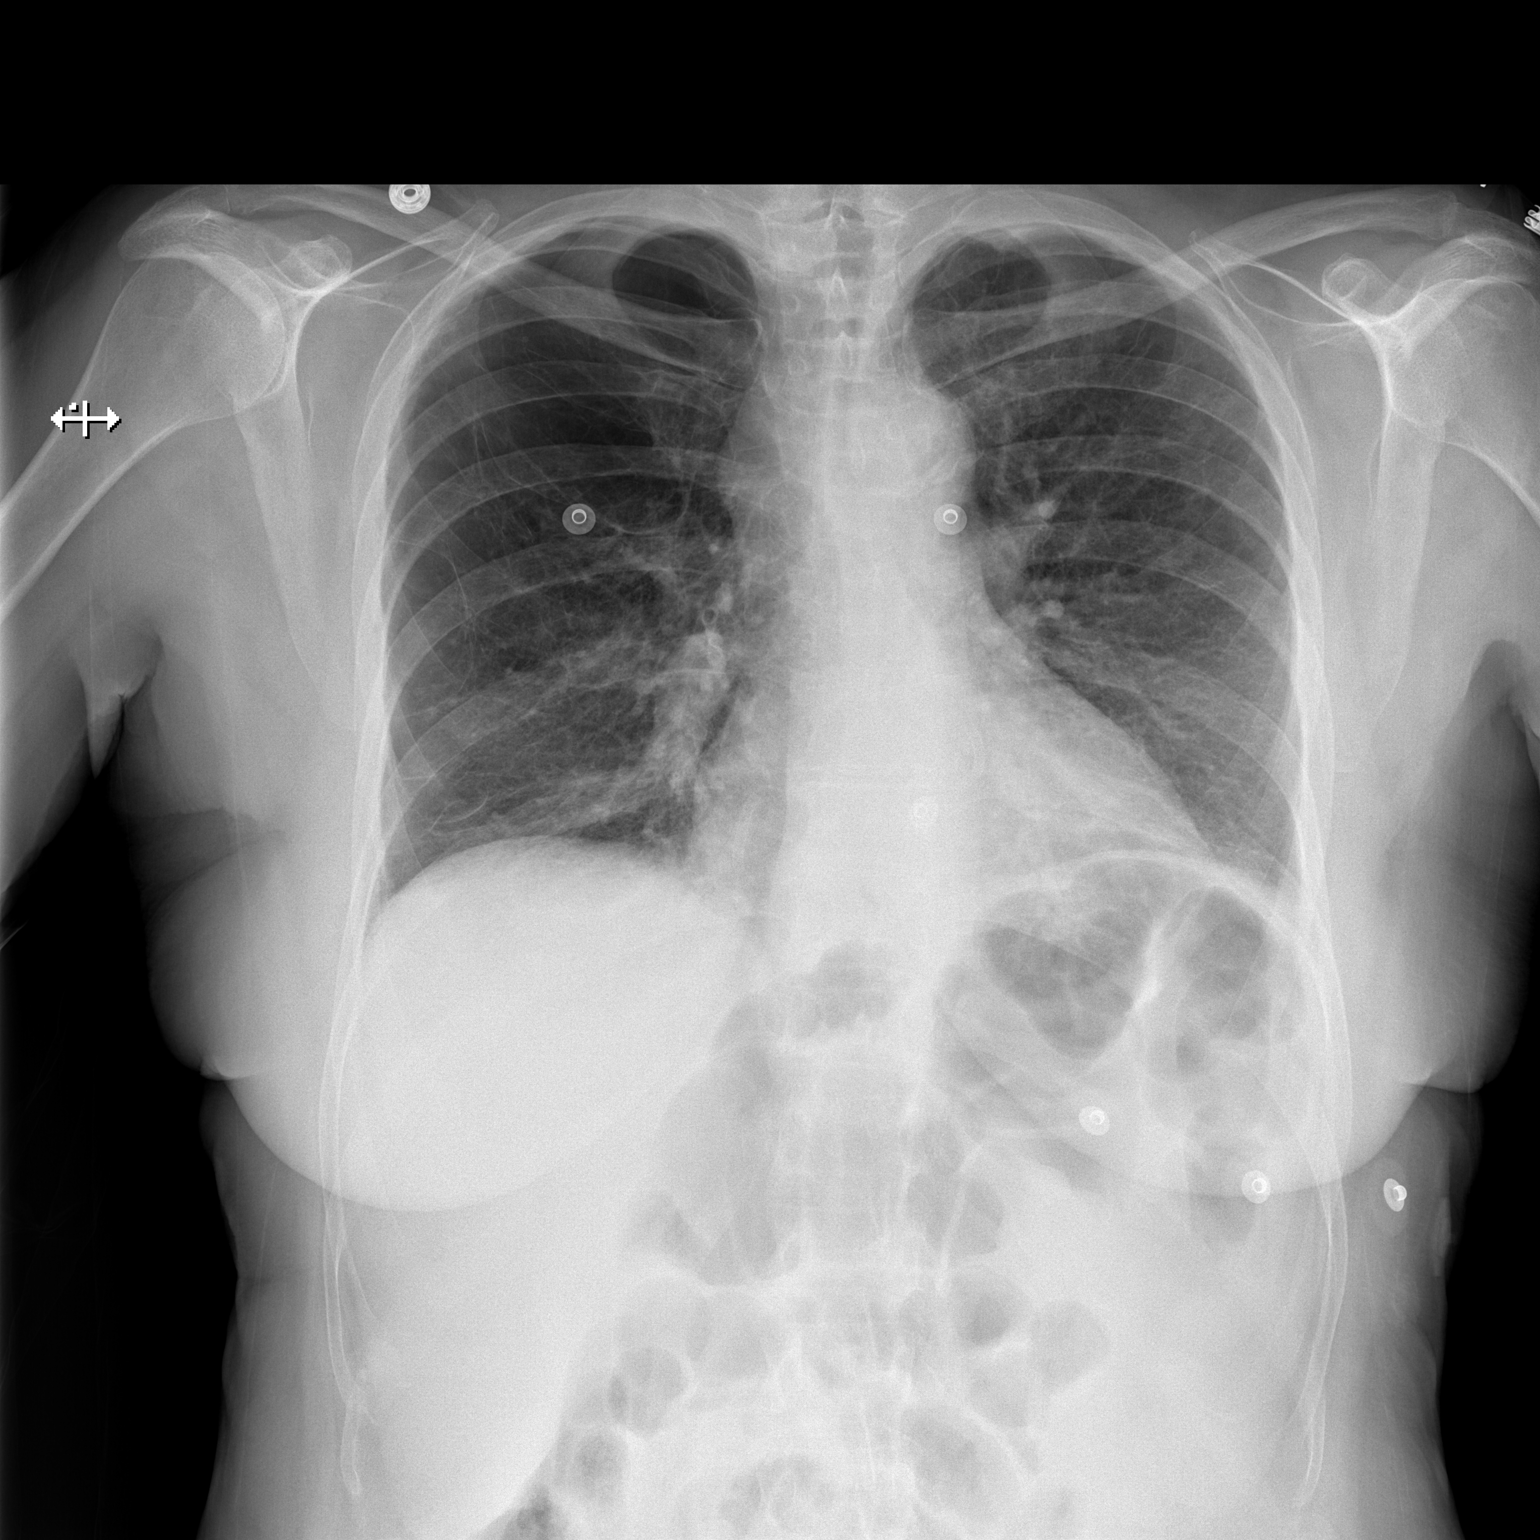

[w chest lat]
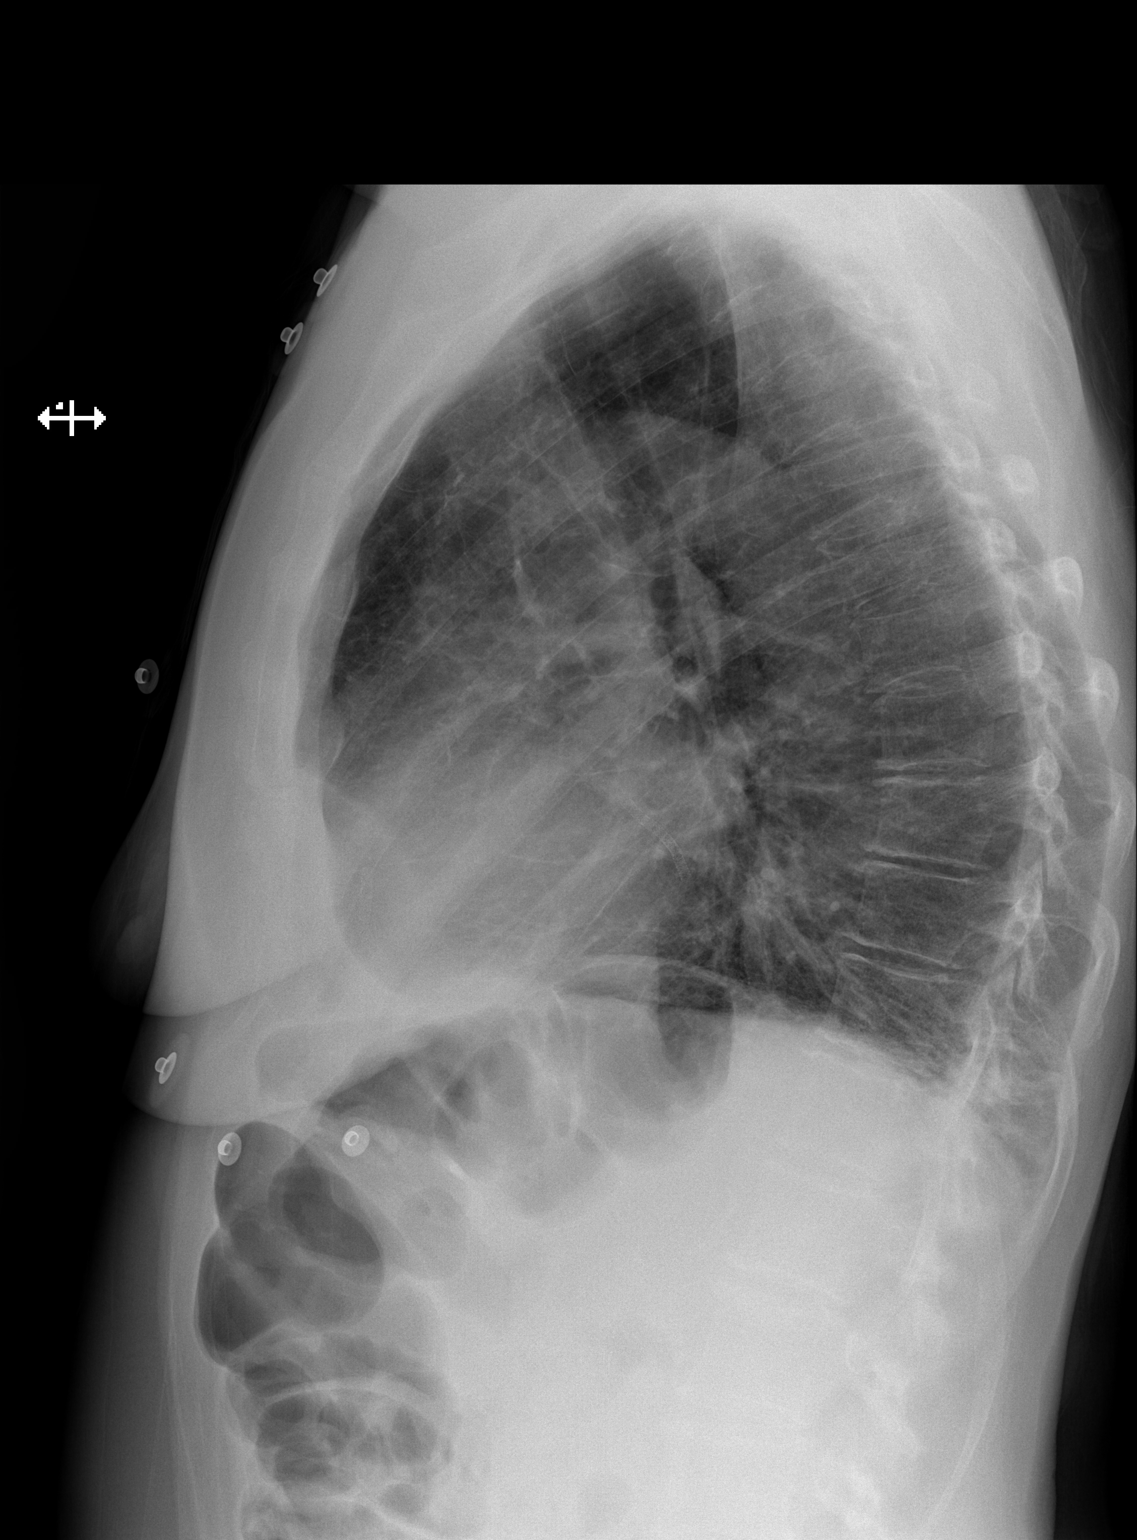

[2 of 2 positions shown; findings below may reference images not displayed]

FINDINGS: Low volumes. Bibasilar atelectasis. Mild cardiomegaly. No pleural
effusion. Right upper lobe emphysema and blebs
IMPRESSION: Bibasilar atelectasis.  Cardiomegaly

## 2017-10-06 DIAGNOSIS — K5901 Slow transit constipation: Secondary | ICD-10-CM | POA: Diagnosis not present

## 2017-10-06 DIAGNOSIS — K644 Residual hemorrhoidal skin tags: Secondary | ICD-10-CM | POA: Diagnosis not present

## 2017-10-06 DIAGNOSIS — R42 Dizziness and giddiness: Secondary | ICD-10-CM | POA: Diagnosis not present

## 2017-10-15 ENCOUNTER — Ambulatory Visit (INDEPENDENT_AMBULATORY_CARE_PROVIDER_SITE_OTHER): Payer: Medicare Other | Admitting: Clinical

## 2017-10-15 ENCOUNTER — Ambulatory Visit (INDEPENDENT_AMBULATORY_CARE_PROVIDER_SITE_OTHER): Payer: Medicare Other | Admitting: Obstetrics & Gynecology

## 2017-10-15 ENCOUNTER — Encounter: Payer: Self-pay | Admitting: Obstetrics & Gynecology

## 2017-10-15 VITALS — BP 131/73 | HR 85 | Ht 65.5 in | Wt 120.7 lb

## 2017-10-15 DIAGNOSIS — R102 Pelvic and perineal pain: Secondary | ICD-10-CM

## 2017-10-15 DIAGNOSIS — I208 Other forms of angina pectoris: Secondary | ICD-10-CM

## 2017-10-15 DIAGNOSIS — F331 Major depressive disorder, recurrent, moderate: Secondary | ICD-10-CM | POA: Diagnosis not present

## 2017-10-15 NOTE — Progress Notes (Signed)
   Subjective:    Patient ID: Michele [name], female    DOB: [birth year], 62 y.o.   MRN: [id number]  HPI  62 yo single P5 here for follow up after an u/s done for pelvic pain.  Her main complaint is pain in her rectum  Review of Systems     Objective:   Physical Exam  Breathing, conversing, and ambulating normally Well nourished, well hydrated White female, no apparent distress     Assessment & Plan:  Rectal pain- she has an appt with a GI next week about this issue Reassurance given Rec that she see Roselyn Reef due to her anxiety (integrated b med)

## 2017-10-15 NOTE — BH Specialist Note (Signed)
Integrated Behavioral Health Initial Visit  MRN: [id number] Name: Michele [name]  Number of [facility] Clinician visits:: 1/6 Session Start time: 11:15 Session End time: 11:45 Total time: 30 minutes  Type of Service: [place] Interpretor:No. Interpretor Name and Language: n/a   Warm Hand Off Completed.       SUBJECTIVE: Michele [name] is a 62 y.o. female accompanied by Spouse Patient was referred by Dr Hulan Fray for depression. Patient reports the following symptoms/concerns: Pt states her primary concern today is lack of motivation post-cancer. She used to enjoy walking 7 miles every morning, housekeeping, and going to church, but worries about current aches and pains,ringing in her ears,  as well as worry over the cancer returning. She isn't sleeping well, lack of appetite,  has been caring for her mentally-challenged brother for the past three years; cared for her mother for many years before her mother passed away two years ago. Pt was treated with antidepressant briefly "many years ago", but does not want BH medications now.  Duration of problem: Increase in past month; Severity of problem: moderate  OBJECTIVE: Mood: Depressed and Affect: Depressed Risk of harm to self or others: No plan to harm self or others  LIFE CONTEXT: Family and Social: Pt lives with her common-law husband and her "mentally-challenged, like a small child" adult brother. Pt was her mothers' caretaker for over a decade before her mother passed away two years ago. Inadequate social support. School/Work: Disability Self-Care: Pt coped best walking 7 miles/day Life Changes: Per pt, in the past three years,  heart attacks, brother moved in to the home, mother passed away, lung cancer, life stress  GOALS ADDRESSED: Patient will: 1. Reduce symptoms of: anxiety, depression, insomnia and stress 2. Increase knowledge and/or ability of: healthy habits   3. Demonstrate ability to: Increase healthy adjustment to current life circumstances and Increase adequate support systems for patient/family  INTERVENTIONS: Interventions utilized: Motivational Interviewing, Sleep Hygiene, Psychoeducation and/or Health Education and Link to [employer]  Standardized Assessments completed: Not Needed  ASSESSMENT: Patient currently experiencing Moderate episode of recurrent major depressive disorder   Patient may benefit from psychoeducation and brief therapeutic interventions regarding coping with symptoms of anxiety and depression .  PLAN: 1. Follow up with behavioral health clinician on : As requested by pt 2. Behavioral recommendations:  -Contact PCP about re-sending referral to physical therapy -Sit outside with daily coffee(decaf) every morning for at least 20 minutes (w/ sunscreen as needed) -Pick out clothes today to wear to church on Sunday; put church attendance on home calendar today -Attend church this coming Sunday; consider putting on calendar and attending at least once/month -Read educational materials regarding coping with symptoms of anxiety and depression -Consider [facility] support groups and classes at [facility]; [employer] for any applicable classes and workshops of interest  3. Referral(s): [facility] (In Clinic) 4. "From scale of 1-10, how likely are you to follow plan?": 9  Garlan Fair, LCSW  Depression screen [facility] 2/9 09/23/2017 09/17/2017 01/21/2017 12/26/2016 11/25/2016  Decreased Interest 0 3 3 0 3  Down, Depressed, Hopeless 0 3 3 0 3  PHQ - 2 Score 0 6 6 0 6  Altered sleeping - 2 3 - 2  Tired, decreased energy - 3 3 - 3  Change in appetite - 2 3 - 2  Feeling bad or failure about yourself  - 2 0 - 0  Trouble concentrating - 2 2 -  0  Moving slowly or fidgety/restless - 2 2 - 0  Suicidal thoughts - 0 0 - 0  PHQ-9 Score - 19 19 - 13  Difficult doing work/chores - -  Extremely dIfficult - Very difficult  Some recent data might be hidden   GAD 7 : Generalized Anxiety Score 09/17/2017 06/17/2016  Nervous, Anxious, on Edge 3 1  Control/stop worrying 2 1  Worry too much - different things 3 1  Trouble relaxing 2 1  Restless 0 0  Easily annoyed or irritable 3 1  Afraid - awful might happen 3 3  Total GAD 7 Score 16 8

## 2017-10-29 ENCOUNTER — Encounter: Payer: Self-pay | Admitting: Nurse Practitioner

## 2017-10-29 ENCOUNTER — Ambulatory Visit (INDEPENDENT_AMBULATORY_CARE_PROVIDER_SITE_OTHER): Payer: Medicare Other | Admitting: Nurse Practitioner

## 2017-10-29 VITALS — BP 130/80 | HR 87 | Ht 65.0 in | Wt 121.3 lb

## 2017-10-29 DIAGNOSIS — K5900 Constipation, unspecified: Secondary | ICD-10-CM | POA: Diagnosis not present

## 2017-10-29 DIAGNOSIS — H9313 Tinnitus, bilateral: Secondary | ICD-10-CM | POA: Diagnosis not present

## 2017-10-29 DIAGNOSIS — H7203 Central perforation of tympanic membrane, bilateral: Secondary | ICD-10-CM | POA: Diagnosis not present

## 2017-10-29 DIAGNOSIS — H838X3 Other specified diseases of inner ear, bilateral: Secondary | ICD-10-CM | POA: Diagnosis not present

## 2017-10-29 DIAGNOSIS — K649 Unspecified hemorrhoids: Secondary | ICD-10-CM

## 2017-10-29 DIAGNOSIS — I208 Other forms of angina pectoris: Secondary | ICD-10-CM

## 2017-10-29 DIAGNOSIS — R070 Pain in throat: Secondary | ICD-10-CM | POA: Diagnosis not present

## 2017-10-29 DIAGNOSIS — H903 Sensorineural hearing loss, bilateral: Secondary | ICD-10-CM | POA: Diagnosis not present
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
## 2017-10-29 NOTE — Patient Instructions (Signed)
If you are age 64 or older, your body mass index should be between 23-30. Your Body mass index is 20.19 kg/m. If this is out of the aforementioned range listed, please consider follow up with your Primary Care Provider.  If you are age 74 or younger, your body mass index should be between 19-25. Your Body mass index is 20.19 kg/m. If this is out of the aformentioned range listed, please consider follow up with your Primary Care Provider.   Use glycerin suppositories as needed to prevent straining.    Call if any bleeding.  You have been given banding information.  Start high fiber diet.  High-Fiber Diet Fiber, also called dietary fiber, is a type of carbohydrate found in fruits, vegetables, whole grains, and beans. A high-fiber diet can have many health benefits. Your health care provider may recommend a high-fiber diet to help:  Prevent constipation. Fiber can make your bowel movements more regular.  Lower your cholesterol.  Relieve hemorrhoids, uncomplicated diverticulosis, or irritable bowel syndrome.  Prevent overeating as part of a weight-loss plan.  Prevent heart disease, type 2 diabetes, and certain cancers.  What is my plan? The recommended daily intake of fiber includes:  38 grams for men under age 63.  65 grams for men over age 97.  57 grams for women under age 22.  70 grams for women over age 51.  You can get the recommended daily intake of dietary fiber by eating a variety of fruits, vegetables, grains, and beans. Your health care provider may also recommend a fiber supplement if it is not possible to get enough fiber through your diet. What do I need to know about a high-fiber diet?  Fiber supplements have not been widely studied for their effectiveness, so it is better to get fiber through food sources.  Always check the fiber content on thenutrition facts label of any prepackaged food. Look for foods that contain at least 5 grams of fiber per serving.  Ask  your dietitian if you have questions about specific foods that are related to your condition, especially if those foods are not listed in the following section.  Increase your daily fiber consumption gradually. Increasing your intake of dietary fiber too quickly may cause bloating, cramping, or gas.  Drink plenty of water. Water helps you to digest fiber. What foods can I eat? Grains Whole-grain breads. Multigrain cereal. Oats and oatmeal. Brown rice. Barley. Bulgur wheat. [place]. Bran muffins. Popcorn. Rye wafer crackers. Vegetables Sweet potatoes. Spinach. Kale. Artichokes. Cabbage. Broccoli. Green peas. Carrots. Squash. Fruits Berries. Pears. Apples. Oranges. Avocados. Prunes and raisins. Dried figs. Meats and Other Protein Sources Navy, kidney, pinto, and soy beans. Split peas. Lentils. Nuts and seeds. Dairy Fiber-fortified yogurt. Beverages Fiber-fortified soy milk. Fiber-fortified orange juice. Other Fiber bars. The items listed above may not be a complete list of recommended foods or beverages. Contact your dietitian for more options. What foods are not recommended? Grains White bread. Pasta made with refined flour. White rice. Vegetables Fried potatoes. Canned vegetables. Well-cooked vegetables. Fruits Fruit juice. Cooked, strained fruit. Meats and Other Protein Sources Fatty cuts of meat. Fried Sales executive or fried fish. Dairy Milk. Yogurt. Cream cheese. Sour cream. Beverages Soft drinks. Other Cakes and pastries. Butter and oils. The items listed above may not be a complete list of foods and beverages to avoid. Contact your dietitian for more information. What are some tips for including high-fiber foods in my diet?  Eat a wide variety of high-fiber foods.  Make  sure that half of all grains consumed each day are whole grains.  Replace breads and cereals made from refined flour or white flour with whole-grain breads and cereals.  Replace white rice with brown rice,  bulgur wheat, or millet.  Start the day with a breakfast that is high in fiber, such as a cereal that contains at least 5 grams of fiber per serving.  Use beans in place of meat in soups, salads, or pasta.  Eat high-fiber snacks, such as berries, raw vegetables, nuts, or popcorn. This information is not intended to replace advice given to you by your health care provider. Make sure you discuss any questions you have with your health care provider. Document Released: 05/20/2005 Document Revised: 10/26/2015 Document Reviewed: 11/02/2013 Elsevier Interactive Patient Education  [employer].

## 2017-10-31 ENCOUNTER — Encounter: Payer: Self-pay | Admitting: Nurse Practitioner

## 2017-10-31 NOTE — Progress Notes (Signed)
I agree with the above note, plan 

## 2017-11-06 ENCOUNTER — Other Ambulatory Visit: Payer: Self-pay | Admitting: Cardiology

## 2017-11-08 IMAGING — CR DG CHEST 2V
2 series · 2 of 2 positions shown · non-contrast
Comparison: PA and lateral chest x-ray June 06, 2015.

CLINICAL DATA: Several day history of cough, shortness of breath,
and chest pain; history of coronary artery disease with stent
placement; current smoker.

EXAM:
CHEST  2 VIEW

[w chest pa]
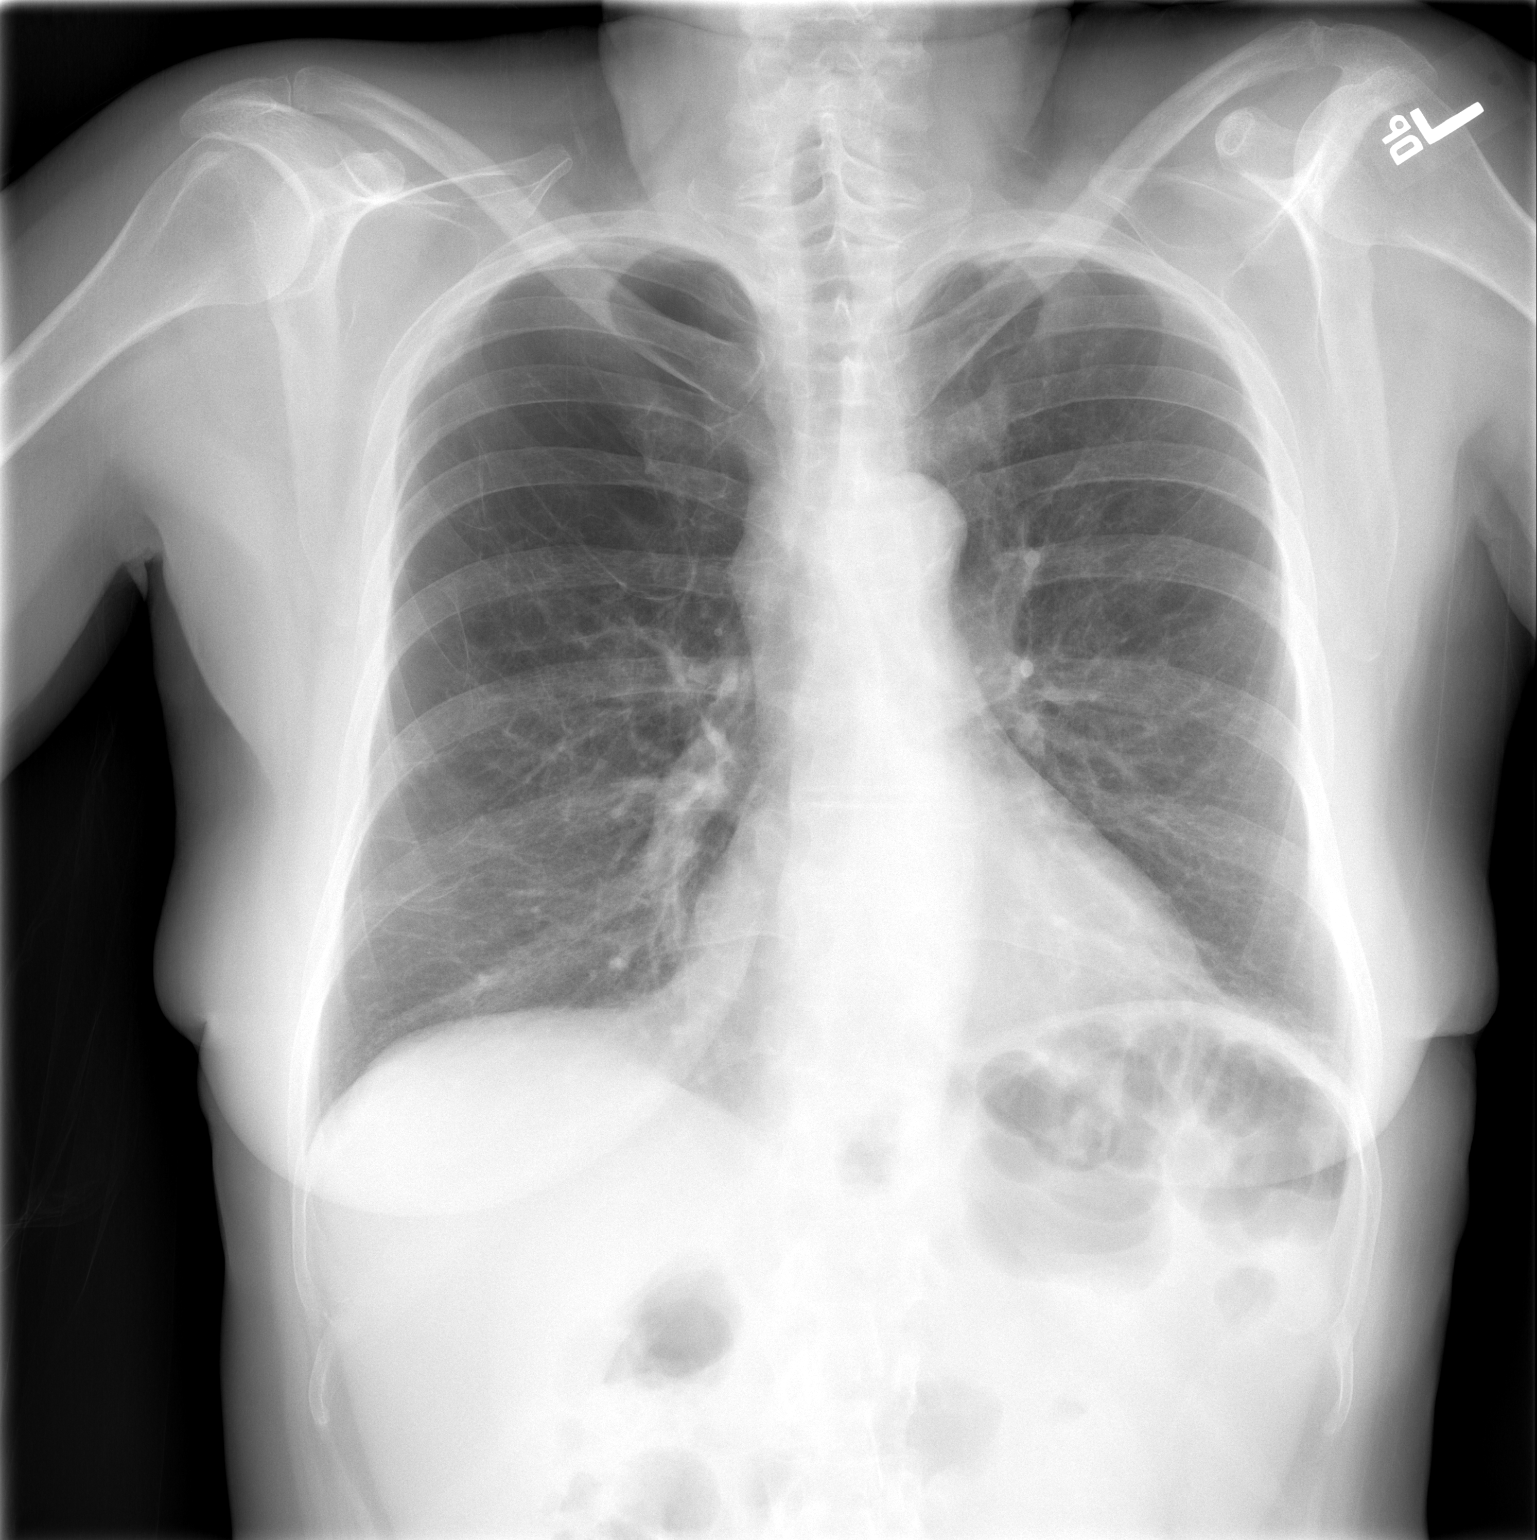

[w chest lat]
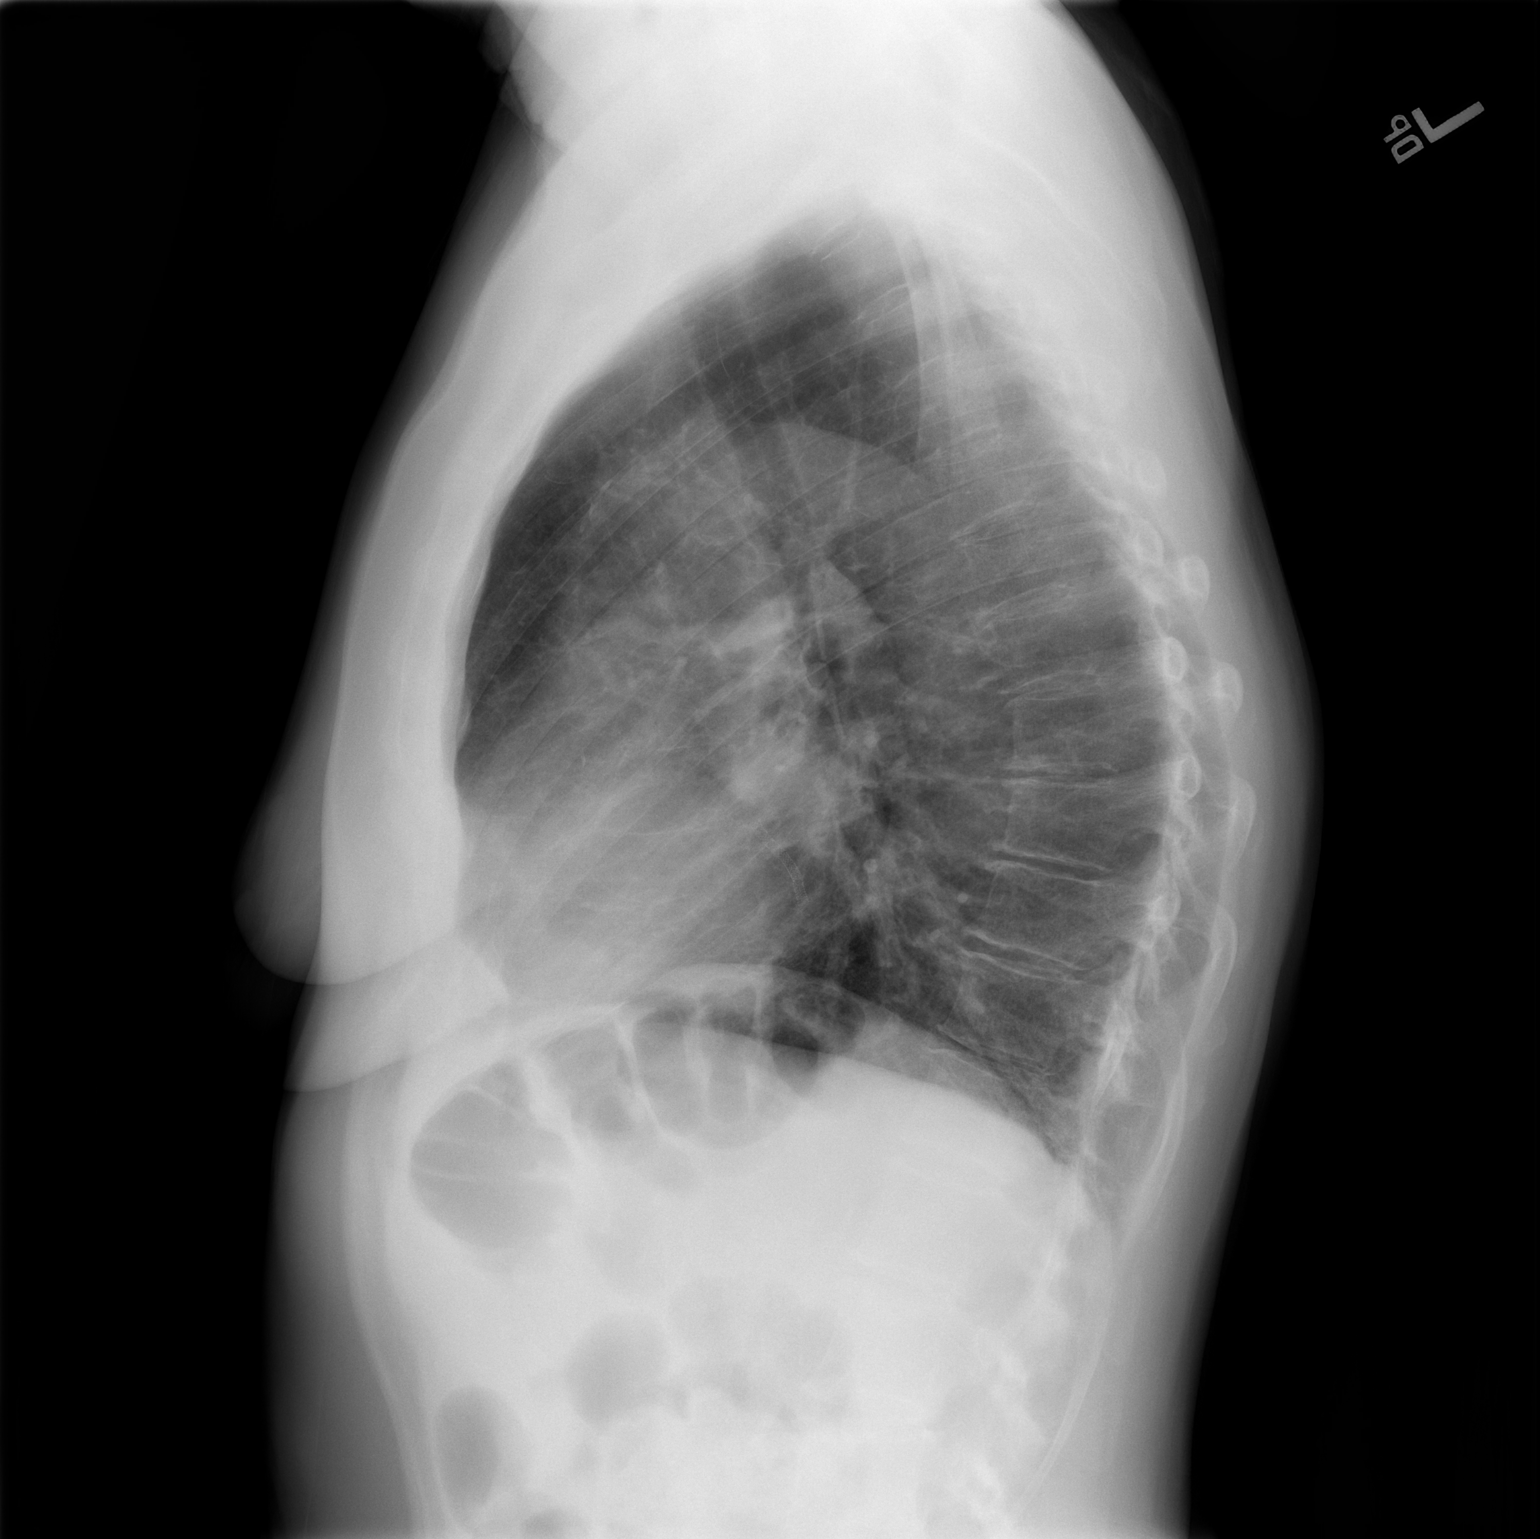

[2 of 2 positions shown; findings below may reference images not displayed]

FINDINGS: The lungs are better inflated today. The pulmonary interstitial
markings have improved. There is no alveolar infiltrate. There is no
pleural effusion. The heart and pulmonary vascularity are normal.
The mediastinum is normal in width. The trachea is midline. There is
mild multilevel degenerative disc space narrowing of the thoracic
spine.
IMPRESSION: Improved appearance of the chest since the previous study. There are
chronic bronchitic changes which are stable. There is no evidence of
pneumonia nor CHF.

## 2017-11-18 ENCOUNTER — Telehealth: Payer: Self-pay | Admitting: Gastroenterology

## 2017-11-18 ENCOUNTER — Telehealth: Payer: Self-pay | Admitting: Cardiology

## 2017-11-18 NOTE — Telephone Encounter (Signed)
Patient states her rectal bleeding has returned. She reports she is having to wear a pad because it is so bad. Denies any constipation or straining. States Tye Savoy told her to call if the bleeding came back and be seen. Offered the 3 PM OV with extender tomorrow but she cannot get a ride at that time. Scheduled at 9:30 AM with Amy Esterwood on 11/20/17.

## 2017-11-18 NOTE — Telephone Encounter (Signed)
Spoke with patient and she stated she has been having palpitations worse than when she was seen in April. States she is having coughing, lungs hurt, shoulder pain, and weakness, seeing pulmonary tomorrow. Did ask if this was like when she had her prior heart attack and she stated no.  Blood pressure at home over the last several days, 101/67, 117/66, 138/71, 111/67, and 115/72. Heart rates up in the 90's-100. Does happen daily Offered her only appointment for week  however that will conflict with her Pulmonology appointment tomorrow. Will forward to Dr [name] for review

## 2017-11-18 NOTE — Telephone Encounter (Signed)
New Message:       Patient c/o Palpitations:  High priority if patient c/o lightheadedness, shortness of breath, or chest pain  1) How long have you had palpitations/irregular HR/ Afib? Are you having the symptoms now? palpitations  2) Are you currently experiencing lightheadedness, SOB or CP? Weak,sob  3) Do you have a history of afib (atrial fibrillation) or irregular heart rhythm? No  4) Have you checked your BP or HR? (document readings if available): hr around the 90's/ 121/73  5) Are you experiencing any other symptoms? Weakness/fatigue    Pt is states when she lays on her left side it just gets worse. Pt states it started under her breast but now it feels like it's coming above her left breast

## 2017-11-19 ENCOUNTER — Encounter: Payer: Self-pay | Admitting: Pulmonary Disease

## 2017-11-19 ENCOUNTER — Ambulatory Visit (INDEPENDENT_AMBULATORY_CARE_PROVIDER_SITE_OTHER)
Admission: RE | Admit: 2017-11-19 | Discharge: 2017-11-19 | Disposition: A | Discharge status: Home / Self Care | Payer: Medicare Other | Source: Ambulatory Visit | Attending: Pulmonary Disease | Admitting: Pulmonary Disease

## 2017-11-19 ENCOUNTER — Ambulatory Visit (INDEPENDENT_AMBULATORY_CARE_PROVIDER_SITE_OTHER): Payer: Medicare Other | Admitting: Pulmonary Disease

## 2017-11-19 VITALS — BP 122/68 | HR 80 | Ht 65.0 in | Wt 120.2 lb

## 2017-11-19 DIAGNOSIS — I208 Other forms of angina pectoris: Secondary | ICD-10-CM | POA: Diagnosis not present

## 2017-11-19 DIAGNOSIS — R0602 Shortness of breath: Secondary | ICD-10-CM

## 2017-11-19 DIAGNOSIS — R079 Chest pain, unspecified: Secondary | ICD-10-CM | POA: Diagnosis not present

## 2017-11-19 DIAGNOSIS — J449 Chronic obstructive pulmonary disease, unspecified: Secondary | ICD-10-CM | POA: Diagnosis not present

## 2017-11-19 LAB — POCT EXHALED NITRIC OXIDE: FeNO level (ppb): 5

## 2017-11-19 NOTE — Patient Instructions (Signed)
Mucinex twice per day as needed for cough, and chest congestion Can try using xopenex (levalbuterol) more often when you have more cough Chest xray today  Follow up in 2 months

## 2017-11-19 NOTE — Telephone Encounter (Signed)
As we have mentioned before.  If she is feeling bad palpitations, she should take a dose of the metoprolol.  1 or 2 doses here in the ER will not hurt her.  Glenetta Hew, MD

## 2017-11-19 NOTE — Progress Notes (Signed)
[facility]  Chief Complaint  Patient presents with  . Follow-up    patient complains of increased SOB, productive cough with thick mucus.     Vital signs: BP 122/68 (BP Location: Left Arm, Cuff Size: Normal)   Pulse 80   Ht 5\' 5"  (1.651 m)   Wt 120 lb 3.2 oz (54.5 kg)   SpO2 99%   BMI 20.00 kg/m   History of Present Illness: Michele [name] is a 62 y.o. female former smoker with COPD/emphysema.  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  She has more cough, and clear sputum.  No fever, hemoptysis, sweats.  Maintaining weight.  Feels more short of breath with activity.  Has pain in left chest.  Getting intermittent palpitations.  No swelling or skin rash.  Uses spiriva daily.  Hasn't been using xopenex much.  She is reluctant to use medications.    Physical Exam:  General - thin Eyes - pupils reactive ENT - no sinus tenderness, no oral exudate, no LAN Cardiac - regular, no murmur Chest - decreased breath sounds, no wheeze, rales Abd - soft, non tender Ext - no edema Skin - no rashes Neuro - normal strength Psych - normal mood  Assessment/Plan:  COPD with emphysema and chronic bronchitis. - has more sputum and dyspnea - chest xray today - continue spiriva - discussed when to use xopenex - add mucinex - might benefit from flutter valve if sputum continues - she doesn't want to use LABA due to concern about palpitations - albuterol caused palpitations  - discussed option of ICS >> she is worried about using steroids  Hx of small cell lung cancer. - under observation with Dr. Julien Nordmann   Patient Instructions  Mucinex twice per day as needed for cough, and chest congestion Can try using xopenex (levalbuterol) more often when you have more cough Chest xray today  Follow up in 2 months   Michele Mires, MD [place] 11/19/2017, 11:38 AM Pager:  [phone]  Flow Sheet  Pulmonary  tests: PFT 12/23/11 >> FEV1 2.13 (85%), FEV1% 59, TLC 5.98 (112%), DLCO 60% CT chest 08/24/16 >> CAD, mod/severe emphysema CT chest 08/29/17 >> severe emphysema Spirometry 11/19/17 >> FEV1 2.58 (100%), FEV1% 76 FeNO 11/19/17 >> 5  Cardiac tests Echo 08/30/17 >> EF 60 to 65%, grade 1 DD, mild MR  Past Medical History: She  has a past medical history of Anginal pain ([place]), ANXIETY, Arthritis, Asthma, Borderline hypertension, CAD S/P percutaneous coronary angioplasty (10/2011, 11/2011; 11/20/2012), Cataract, Chronic kidney disease, Collagen vascular disease ([place]), CONTACT DERMATITIS&OTHER ECZEMA DUE UNSPEC CAUSE, COPD, DEPRESSION, DERMATOFIBROMA, DYSLIPIDEMIA, Dysrhythmia, Emphysema of lung ([place]), Encounter for antineoplastic chemotherapy (03/12/2016), GERD, Hepatitis, Hiatal hernia, History of nuclear stress test (03/03/2012), History of radiation therapy (03/19/16- 05/06/16), History of radiation therapy (06/25/16- 07/08/16), History ST elevation myocardial infarction (STEMI) of inferolateral wall (10/2011), Hypertension, INSOMNIA, KNEE PAIN, CHRONIC, LOW BACK PAIN, RESTLESS LEG SYNDROME, Seizures ([place]), Shortness of breath dyspnea, Small cell lung carcinoma (HCC) (02/26/2016), SPONDYLOSIS, CERVICAL, WITH RADICULOPATHY, Tobacco abuse, Tuberculosis, UTI (urinary tract infection), and VITAMIN D DEFICIENCY.  Past Surgical History: She  has a past surgical history that includes Leg wound repair / closure (3500); Coronary angioplasty with stent (10/10/11); Coronary angioplasty with stent (11/06/11); Tonsillectomy; Tubal ligation (1970's); Knee surgery; Coronary angioplasty with stent (11/19/2012); doppler echocardiography (May 2013; September 2015); NM MYOVIEW LTD (October 2013; 12/2013); CPET (09/07/2012); Cardiac catheterization (03/02/2014); left heart catheterization with coronary angiogram (N/A,  10/10/2011); percutaneous coronary stent intervention (pci-s) (N/A, 11/06/2011); left heart catheterization with coronary angiogram  (N/A, 11/19/2012); left heart catheterization with coronary angiogram (N/A, 02/19/2013); left heart catheterization with coronary angiogram (N/A, 03/02/2014); Colonoscopy; Polypectomy; Video bronchoscopy with endobronchial ultrasound (N/A, 02/14/2016); Direct laryngoscopy (N/A, 02/14/2016); OTHER SURGICAL HISTORY; Breast biopsy (2000's); NM MYOVIEW LTD (02/2016); and lipoma surgery (Left, 10/2016).  Family History: Her family history includes Asthma in her mother; Cancer in her maternal grandmother; Colon polyps in her mother; Diabetes in her mother; Emphysema in her mother; Heart attack in her maternal grandfather; Heart disease in her father and mother; Hyperlipidemia in her mother; Hypertension in her mother; Kidney cancer in her brother; Stomach cancer in her brother and brother; Stroke in her mother; Stroke (age of onset: 34) in her brother.  Social History: She  reports that she quit smoking about 21 months ago. Her smoking use included cigarettes. She has a 60.00 pack-year smoking history. She has never used smokeless tobacco. She reports that she does not drink alcohol or use drugs.  Medications: Allergies as of 11/19/2017      Reactions   Ciprofloxacin Other (See Comments)   Hypoglycemia    Aspirin Other (See Comments)   GI upset; patient is only able to take enteric coated Aspirin.     Crestor [rosuvastatin] Other (See Comments)   Muscle pain    Ibuprofen Other (See Comments)   GI upset    Wellbutrin [bupropion] Palpitations   Zoloft [sertraline Hcl] Other (See Comments)   Insomnia, fatigue    Albuterol Palpitations   Lipitor [atorvastatin] Other (See Comments)   Muscle pain    Sulfonamide Derivatives Itching, Rash      Medication List        Accurate as of 11/19/17 11:38 AM. Always use your most recent med list.          ALPRAZolam 1 MG tablet Commonly known as:  XANAX Take 1 mg by mouth 3 (three) times daily.   CALCIUM 600+D 600-400 MG-UNIT tablet Generic drug:  Calcium  Carbonate-Vitamin D Take 1 tablet by mouth daily.   clopidogrel 75 MG tablet Commonly known as:  PLAVIX TAKE 1 TABLET(75 MG) BY MOUTH DAILY   CoQ10 100 MG Caps Take 100 mg by mouth daily.   isosorbide mononitrate 30 MG 24 hr tablet Commonly known as:  IMDUR TAKE 1 TABLET(30 MG) BY MOUTH AT BEDTIME   levalbuterol 45 MCG/ACT inhaler Commonly known as:  XOPENEX HFA Inhale 1-2 puffs into the lungs every 6 (six) hours as needed for wheezing.   nitroGLYCERIN 0.4 MG SL tablet Commonly known as:  NITROSTAT DISSOLVE 1 TABLET UNDER THE TONGUE EVERY 5 MINUTES AS NEEDED FOR CHEST PAIN   pantoprazole 40 MG tablet Commonly known as:  PROTONIX Take 40 mg by mouth daily.   pravastatin 40 MG tablet Commonly known as:  PRAVACHOL TAKE 1 TABLET(40 MG) BY MOUTH EVERY EVENING   tiotropium 18 MCG inhalation capsule Commonly known as:  SPIRIVA Place 18 mcg into inhaler and inhale daily.   VASCEPA 1 g Caps Generic drug:  Icosapent Ethyl TAKE 1 CAPSULES BY MOUTH TWICE DAILY

## 2017-11-20 ENCOUNTER — Encounter: Payer: Self-pay | Admitting: Physician Assistant

## 2017-11-20 ENCOUNTER — Ambulatory Visit (INDEPENDENT_AMBULATORY_CARE_PROVIDER_SITE_OTHER): Payer: Medicare Other | Admitting: Physician Assistant

## 2017-11-20 VITALS — BP 92/60 | HR 76 | Ht 65.5 in | Wt 120.0 lb

## 2017-11-20 DIAGNOSIS — K625 Hemorrhage of anus and rectum: Secondary | ICD-10-CM | POA: Diagnosis not present

## 2017-11-20 DIAGNOSIS — K644 Residual hemorrhoidal skin tags: Secondary | ICD-10-CM

## 2017-11-20 DIAGNOSIS — I208 Other forms of angina pectoris: Secondary | ICD-10-CM

## 2017-11-20 NOTE — Progress Notes (Signed)
Subjective:    Patient ID: Michele [name], female    DOB: 01-12-1956, 62 y.o.   MRN: [id number]  HPI Der is a 62 year old white female, known to Dr. Ardis Hughs who comes in today with complaints of rectal bleeding.  She has history of coronary artery disease a status post MI and drug-eluting stent to the RCA.  She is maintained on Plavix.  Also with hypertension, COPD, chronic GERD, and history of small cell lung CA (T2 N2 M0). She last had colonoscopy in May 2017 with 2 small sessile polyps removed from the left colon both of which were sessile serrated adenomas.  Colonoscopy was otherwise negative with no mention of internal hemorrhoids.  She was indicated for 5-year interval follow-up. She came in in December 2018 with complaints of abdominal pain and underwent CT of the abdomen and pelvis which was negative. She was seen in the office about 2 weeks ago by Tye Savoy, NP with complaints of rectal bleeding, and constipation, and was noted to have a partially reducible nonthrombosed l hemorrhoid and mildly inflamed internal hemorrhoids. She was asked to use a fiber supplement on a daily basis, MiraLAX as needed.  Patient says she has hemorrhoid suppositories at home but has not been using those regularly and also has an over-the-counter cream.  She has not been using MiraLAX because it is too expensive. Patient comes back in today eating that she is continued to have intermittent bleeding, generally this has been small amounts on the tissue with wiping has had a couple of occasions with larger amounts of blood.  She had 1 of these episodes 2 days ago and says that the bleeding was "bad".  She says it seemed like a menstrual period where she continued to have some oozing and had to wear a pad for 1 day which concerned her.  No current complaints of rectal pain or discomfort.  She has had some migrating abdominal pain which is not new for her.  She  is currently not constipated has been trying to take  Metamucil daily. She does not seem to understand why she has been having bleeding, and is worried about cancer.  Review of Systems Pertinent positive and negative review of systems were noted in the above HPI section.  All other review of systems was otherwise negative.  Outpatient Encounter Medications as of 11/20/2017  Medication Sig  . ALPRAZolam (XANAX) 1 MG tablet Take 1 mg by mouth 3 (three) times daily.   . Calcium Carbonate-Vitamin D (CALCIUM 600+D) 600-400 MG-UNIT tablet Take 1 tablet by mouth daily.  . clopidogrel (PLAVIX) 75 MG tablet TAKE 1 TABLET(75 MG) BY MOUTH DAILY  . Coenzyme Q10 (COQ10) 100 MG CAPS Take 100 mg by mouth daily.   . isosorbide mononitrate (IMDUR) 30 MG 24 hr tablet TAKE 1 TABLET(30 MG) BY MOUTH AT BEDTIME (Patient taking differently: Take 30 mg by mouth at bedtime)  . levalbuterol (XOPENEX HFA) 45 MCG/ACT inhaler Inhale 1-2 puffs into the lungs every 6 (six) hours as needed for wheezing.  . nitroGLYCERIN (NITROSTAT) 0.4 MG SL tablet DISSOLVE 1 TABLET UNDER THE TONGUE EVERY 5 MINUTES AS NEEDED FOR CHEST PAIN  . pantoprazole (PROTONIX) 40 MG tablet Take 40 mg by mouth daily.  . pravastatin (PRAVACHOL) 40 MG tablet TAKE 1 TABLET(40 MG) BY MOUTH EVERY EVENING (Patient taking differently: Take 40 mg by mouth in the evening)  . tiotropium (SPIRIVA) 18 MCG inhalation capsule Place 18 mcg into inhaler and inhale daily.  [name] VASCEPA  1 g CAPS TAKE 1 CAPSULES BY MOUTH TWICE DAILY (Patient taking differently: Take 1 gram by mouth once a day)   Facility-Administered Encounter Medications as of 11/20/2017  Medication  . HYDROcodone-acetaminophen (NORCO/VICODIN) 5-325 MG per tablet 1 tablet   Allergies  Allergen Reactions  . Ciprofloxacin Other (See Comments)    Hypoglycemia   . Aspirin Other (See Comments)    GI upset; patient is only able to take enteric coated Aspirin.    . Crestor [Rosuvastatin] Other (See Comments)    Muscle pain   . Ibuprofen Other (See Comments)     GI upset   . Wellbutrin [Bupropion] Palpitations  . Zoloft [Sertraline Hcl] Other (See Comments)    Insomnia, fatigue   . Albuterol Palpitations  . Lipitor [Atorvastatin] Other (See Comments)    Muscle pain   . Sulfonamide Derivatives Itching and Rash   Patient Active Problem List   Diagnosis Date Noted  . Orthostatic syncope 09/09/2017  . Syncope 08/29/2017  . AAA (abdominal aortic aneurysm) without rupture ([place]) 08/13/2017  . Orthostatic hypotension 08/13/2017  . Headache, frequent episodic tension-type 06/12/2017  . Thrush 02/27/2017  . Cough 02/20/2017  . COPD without exacerbation ([place]) 12/12/2016  . Dysphagia 12/12/2016  . Dizziness of unknown cause 11/20/2016  . PMB (postmenopausal bleeding) 06/17/2016  . Superficial venous thrombosis of right upper extremity 05/22/2016  . Extravasation injury 04/18/2016  . Constipation 03/29/2016  . Cancer associated pain 03/29/2016  . Dehydration 03/29/2016  . Encounter for antineoplastic chemotherapy 03/12/2016  . 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 Small cell carcinoma of right lung ([place]) 02/26/2016  . Stable angina ([place]) 02/25/2015  . Stenosis of coronary stent 02/25/2015  . Abdominal pain in female patient 11/24/2014  . Chronic fatigue and malaise 04/10/2014  . Heart palpitations 11/28/2013  . Essential hypertension 05/30/2013  . Coronary artery spasm, hx of 04/30/2013  . Acrocyanosis ([place]) 01/01/2013  . Tobacco abuse, ongoing   . Edema of upper extremity 11/30/2012  . Lipoma of lower extremity 10/23/2012  . CAD -S/P MI-PCI AVG 5/13 then staged DES to RCA  11/06/11. ISR- PCI 11/19/12, cath 02/18/13- no ISR, + spasm, Myoview low risk Nov 2014 10/10/2011    Class: Diagnosis of  . History ST elevation myocardial infarction (STEMI) of inferolateral wall 10/02/2011  . Osteopenia 07/30/2011  . Leg pain, anterior, left 08/24/2010  . Palpitations 11/28/2009  . HERPES SIMPLEX INFECTION 06/28/2009  .  VITAMIN D DEFICIENCY 11/28/2008  . INSOMNIA 10/01/2007  . HYPERGLYCEMIA 08/30/2007  . Dyslipidemia, goal LDL below 70 08/10/2007  . COPD (chronic obstructive pulmonary disease) ([place]) 12/23/2006  . Anxiety state 06/11/2006    Class: Diagnosis of  . Depression 06/11/2006  . RESTLESS LEG SYNDROME 06/11/2006  . GERD 06/11/2006  . KNEE PAIN, CHRONIC 06/11/2006  . SPONDYLOSIS, CERVICAL, WITH RADICULOPATHY 06/11/2006  . LOW BACK PAIN 06/11/2006  . SEIZURE DISORDER 06/11/2006    Class: Diagnosis of   Social History   Socioeconomic History  . Marital status: Significant Other    Spouse name: Not on file  . Number of children: 5  . Years of education: Not on file  . Highest education level: Not on file  Occupational History  . Occupation: Disabled     Employer: DISABLED  Social Needs  . Financial resource strain: Not on file  . Food insecurity:    Worry: Not on file    Inability: Not on file  . Transportation needs:    Medical: Not  on file    Non-medical: Not on file  Tobacco Use  . Smoking status: Former Smoker    Packs/day: 1.50    Years: 40.00    Pack years: 60.00    Types: Cigarettes    Last attempt to quit: 02/02/2016    Years since quitting: 1.8  . Smokeless tobacco: Never Used  . Tobacco comment: 04/15/12 "I quit once for 2 1/2 years; smoking cessation counselor already here to visit"; done to less than 1/2 ppd (03/02/2013) - "1 pack per week" - 05/24/13  Substance and Sexual Activity  . Alcohol use: No    Alcohol/week: 0.0 oz  . Drug use: No  . Sexual activity: Not Currently    Birth control/protection: Post-menopausal  Lifestyle  . Physical activity:    Days per week: Not on file    Minutes per session: Not on file  . Stress: Not on file  Relationships  . Social connections:    Talks on phone: Not on file    Gets together: Not on file    Attends religious service: Not on file    Active member of club or organization: Not on file    Attends meetings of clubs  or organizations: Not on file    Relationship status: Not on file  . Intimate partner violence:    Fear of current or ex partner: Not on file    Emotionally abused: Not on file    Physically abused: Not on file    Forced sexual activity: Not on file  Other Topics Concern  . Not on file  Social History Narrative   Divorced mother of 66 and a grandmother 81, great-grandmother of 1    On disability, previously worked as a Educational psychologist.  Quit smoking 06/2007 but restarted 1/11 -- smoking a pack a day.  -- now a pack lasts a week.   Does not drink alcohol.   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   0 Caffeine drinks daily     Ms. Mczeal's family history includes Asthma in her mother; Cancer in her maternal grandmother; Colon polyps in her mother; Diabetes in her mother; Emphysema in her mother; Heart attack in her maternal grandfather; Heart disease in her father and mother; Hyperlipidemia in her mother; Hypertension in her mother; Kidney cancer in her brother; Stomach cancer in her brother and brother; Stroke in her mother; Stroke (age of onset: 62) in her brother.      Objective:    Vitals:   11/20/17 0936  BP: 92/60  Pulse: 76    Physical Exam; well-developed older white female in no acute distress, accompanied by her boyfriend blood pressure 92/60 pulse 76, height 5 foot 5, weight 120, BMI 19.6.  HEENT; nontraumatic normocephalic EOMI PERRLA sclera anicteric oropharynx clear, Cardiovascular; regular rate and rhythm with S1-S2.  Pulmonary ;somewhat decreased breath sounds bilaterally, Abdomen; soft, bowel sounds are present, no palpable mass or hepatosplenomegaly, she has mild tenderness in the right lower quadrant.  Rectal ;exam, she has a friable inflamed external hemorrhoid a few spots of blood on it is not thrombosed but tender on exam.  Anoscopy was not repeated today this was done 3 weeks ago at last office visit with small internal hemorrhoids noted.   EXt; no clubbing cyanosis or edema skin warm and dry, Neuro psych; alert and oriented, grossly nonfocal mood and affect appropriate       Assessment & Plan:   #17  62 year old white female  with persistent intermittent rectal bleeding and 2 occasions of increased volume of bleeding acquiring her to wear a pad.  She has an obvious friable external hemorrhoid which is not thrombosed and was noted on anoscopy last visit to have a small internal hemorrhoids. I think the external hemorrhoid has been bleeding.  #2 mild chronic constipation #3 history of sessile serrated adenomatous polyps-up-to-date with colonoscopy last done May 2017, due for follow-up 2022 #4 chronic antiplatelet therapy-on Plavix #5 coronary artery disease status post MI/drug-eluting stent RCA #6 COPD #7 history of small cell lung CA( T2 N2 M0)  Plan; Carefully explained to patient again today that her bleeding is coming from hemorrhoids and the fact that she is on chronic Plavix is aggravating her small volume bleeding.  She will start Benefiber 2 tablespoons every day and a large glass of water Use MiraLAX or equate as needed for constipation Start Preparation H suppositories every night at bedtime for at least 2 weeks and then restart if she has recurrent episodes of bleeding Use Preparation H cream 3 times daily to the external hemorrhoid, I carefully explained to use this after bowel movements and then at least 2 other times during the day. If patient continues to have complaints of rectal bleeding requiring her to have wear a pad, next step would be to repeat follow-up colonoscopy early, then determine if banding of internal hemorrhoids would be beneficial versus referral to a surgeon for external hemorrhoidectomy.  Jacari Kirsten S Leler Brion PA-C 11/20/2017   Cc: [name], MD

## 2017-11-20 NOTE — Patient Instructions (Addendum)
Take Benefiber 2 tablesoons daily in large glass of water. You can add it to lemonade, gatorade.  Use Preperation H suppositories at bedtime every night for 2 weeks then as needed. Usse hemorrhoid cream 3 times daily for external hemorrhoids.   If bleeding increases or continues, call back for an appointment, we may need to schedule a colonoscopy.   Miralax as needed for constipation.  I If you are age 64 or younger, your body mass index should be between 19-25. Your Body mass index is 19.67 kg/m. If this is out of the aformentioned range listed, please consider follow up with your Primary Care Provider.

## 2017-11-21 ENCOUNTER — Telehealth: Payer: Self-pay | Admitting: Pulmonary Disease

## 2017-11-21 NOTE — Telephone Encounter (Signed)
Attempted to call pt but unable to reach her.  Left a message for pt to return call.

## 2017-11-21 NOTE — Telephone Encounter (Signed)
Patient returned call, CB is [phone]

## 2017-11-21 NOTE — Telephone Encounter (Signed)
Dg Chest 2 View  Result Date: 11/19/2017 CLINICAL DATA:  62 year old female with shortness of breath and chest pain for 2 months. EXAM: CHEST - 2 VIEW COMPARISON:  CTA chest 08/29/2017, earlier. FINDINGS: Stable large lung volumes. Mediastinal contours remain normal. Coronary artery atherosclerosis and/or stent redemonstrated. Calcified aortic atherosclerosis. Visualized tracheal air column is within normal limits. 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 A tiny right midlung nodule in the periphery is stable. No pneumothorax, pulmonary edema, pleural effusion or acute pulmonary opacity. No acute osseous abnormality identified. Negative visible bowel gas pattern. IMPRESSION: 1.  No acute cardiopulmonary abnormality. 2. Aortic Atherosclerosis (ICD10-I70.0) and Emphysema (ICD10-J43.9). Electronically Signed   By: Genevie Ann M.D.   On: 11/19/2017 14:49     Please let her know CXR shows changes of emphysema.  No other worrisome findings.  No change to current tx plan.

## 2017-11-21 NOTE — Telephone Encounter (Signed)
I absolutely have no concept of what she is talking about.  I am not sure who told her not to take metoprolol because I told her to take metoprolol.  She has had passout spells of hypotension but if she is having palpitation she should build tolerate low-dose beta-blocker.  I agree if she is having problems she can go to the emergency room and be seen.  I doubt very seriously that her "stents are closed up".  Her symptom when she had angina was angina not palpitations.  If she is having coughing and shoulder pains etc., it may not be a bad idea for her to be seen by her PCP or pulmonary to discuss these symptoms.  Glenetta Hew, MD

## 2017-11-21 NOTE — Telephone Encounter (Signed)
Called pt to let her know the result from the tests that were performed at [facility].  Pt expressed understanding.   Pt stated she has a lot of pain still in her left lung and wants to know if anything can be done to help with that.  Dr. Halford Chessman, please advise. Thanks!

## 2017-11-21 NOTE — Telephone Encounter (Signed)
Called and spoke with pt letting her know the results of her cxr.  Pt expressed understanding of the results of the cxr.  While speaking with pt, she states she was not made aware of the results from the spirometry or feno that was done while she was at the office on 11/19/17  Dr. Halford Chessman, please advise.

## 2017-11-21 NOTE — Progress Notes (Signed)
I agree with the above note, plan 

## 2017-11-21 NOTE — Telephone Encounter (Signed)
Patient returned call, CB is [phone]

## 2017-11-21 NOTE — Telephone Encounter (Signed)
Wrong encounter was clicked by Norman Herrlich and sent back to [facility] triage.  Routing message back to Alvina Filbert, LPN ad was last routed by Glenetta Hew, MD

## 2017-11-21 NOTE — Telephone Encounter (Signed)
Spoke with patient and gave recommendations. She stated this is different than palpitations that she has before and concerned "stent has closed up". She does have shoulder pain at times, denies being like when she had stents in past. She again stated she was told NOT to take the Metoprolol secondary to passing out from hypotension. Advised patient would forward to Dr [name] but he may just want to discuss further at follow up in July, go to ED if worse.

## 2017-11-21 NOTE — Telephone Encounter (Signed)
Spirometry looked okay.  FeNO was normal

## 2017-11-24 NOTE — Telephone Encounter (Signed)
Called and spoke with patient advised her of VS response. Patient verbalized understanding. Nothing further needed at this time.

## 2017-11-24 NOTE — Telephone Encounter (Signed)
She can try OTC analgesic such as tylenol.

## 2017-11-30 IMAGING — CT CT ABD-PELV W/ CM
1 of 3 series · 14 of 32 positions shown, 19 images · IV contrast (iopamidol)
Comparison: 11/04/2014 and prior exams

CLINICAL DATA: 59-year-old female with left lower abdominal and
pelvic pain for 1 year.

EXAM:
CT ABDOMEN AND PELVIS WITH CONTRAST
TECHNIQUE: Multidetector CT imaging of the abdomen and pelvis was performed
using the standard protocol following bolus administration of
intravenous contrast.
CONTRAST:  100mL LOCGPX-INN IOPAMIDOL (LOCGPX-INN) INJECTION 61%

[Series 2: abd/pelvis w/cm · axial · 0.66mm/px · z∈[-463,-33]mm · 14 of 96 slices shown, 19 images]
[im 5/96  soft-tissue]
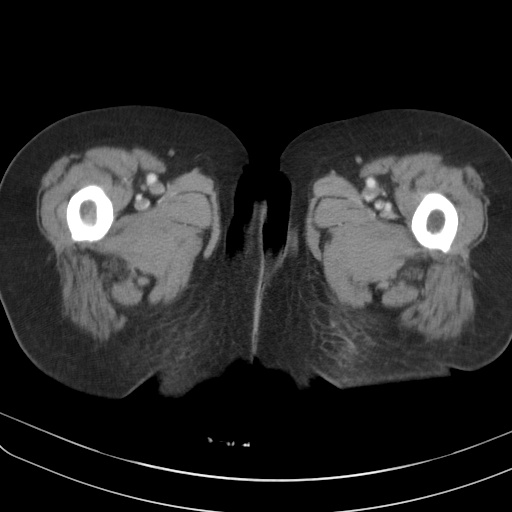
[im 5/96  bone]
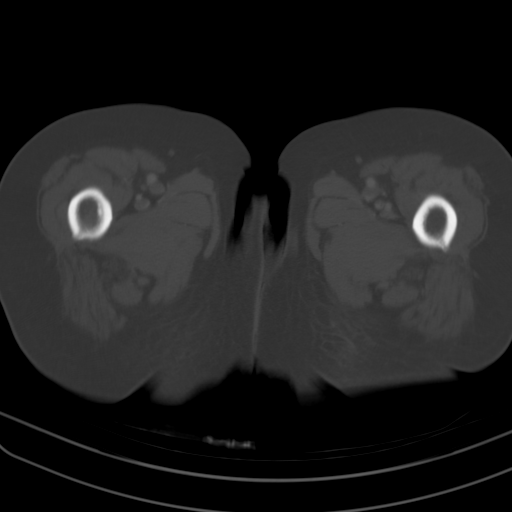
[im 15/96  soft-tissue]
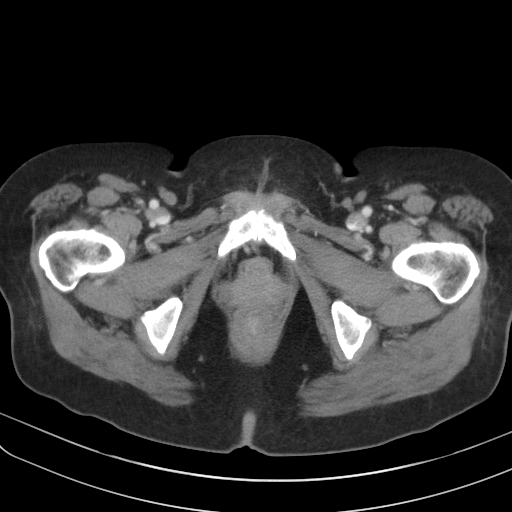
[im 20/96  soft-tissue]
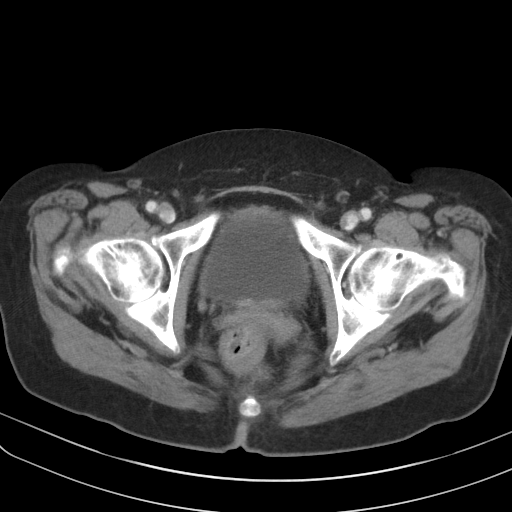
[im 29/96  soft-tissue]
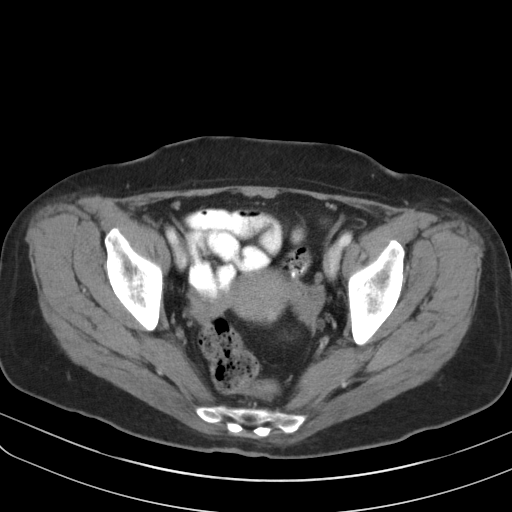
[im 34/96  soft-tissue]
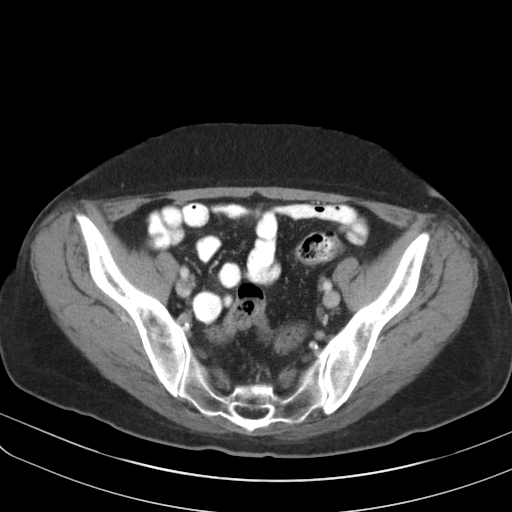
[im 43/96  soft-tissue]
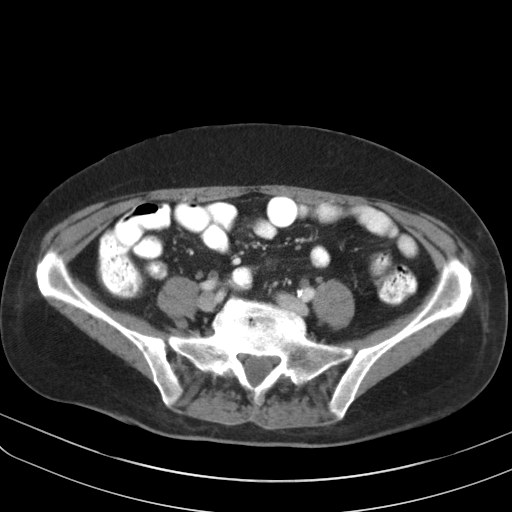
[im 48/96  soft-tissue]
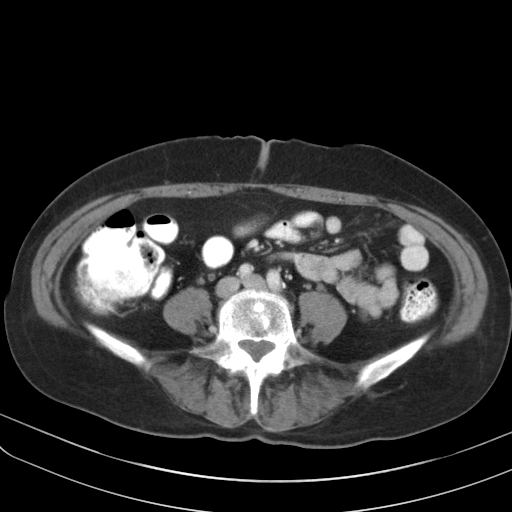
[im 53/96  soft-tissue]
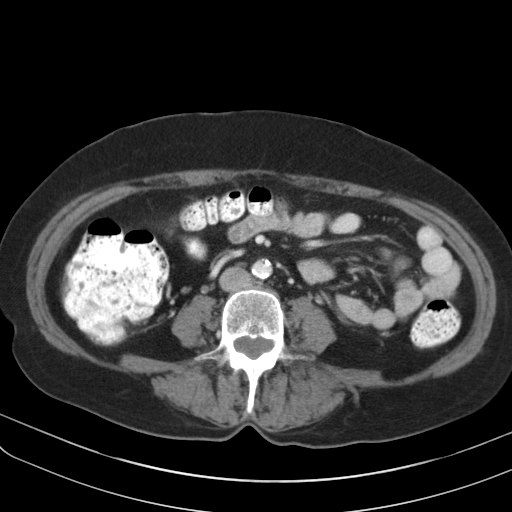
[im 62/96  soft-tissue]
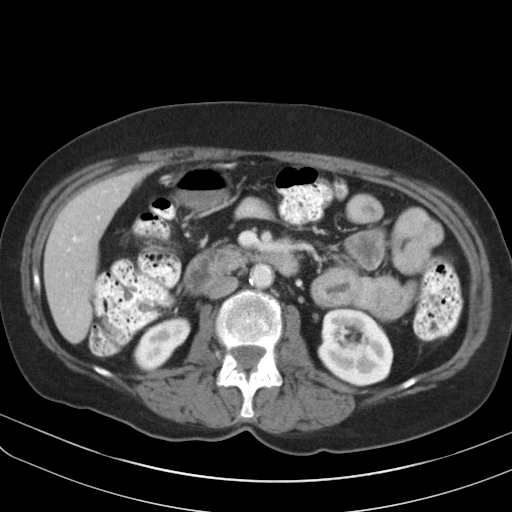
[im 62/96  bone]
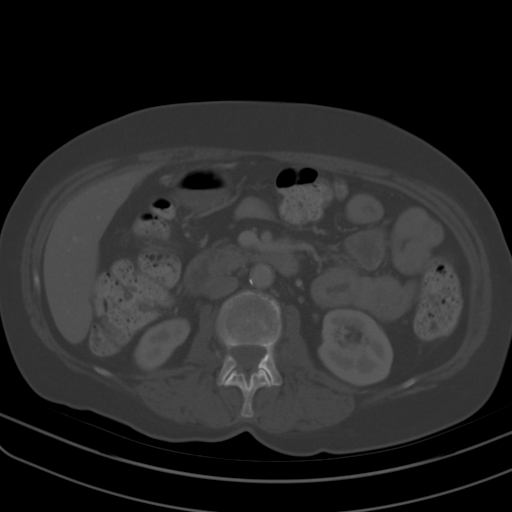
[im 67/96  soft-tissue]
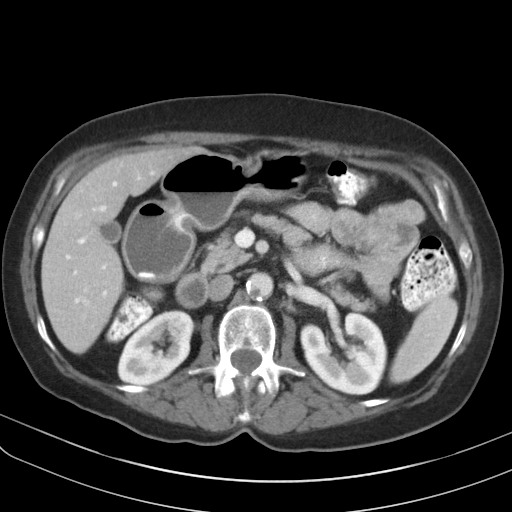
[im 77/96  soft-tissue]
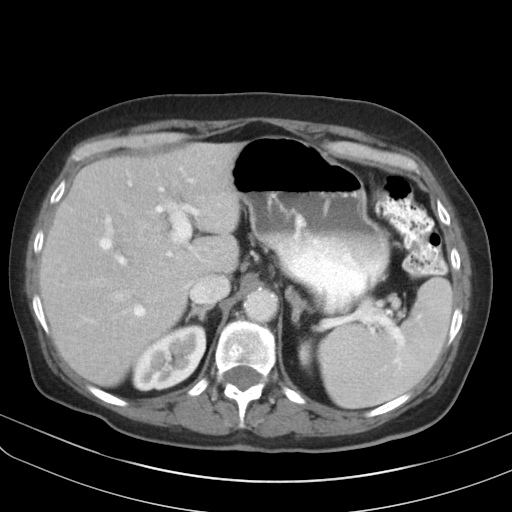
[im 77/96  lung]
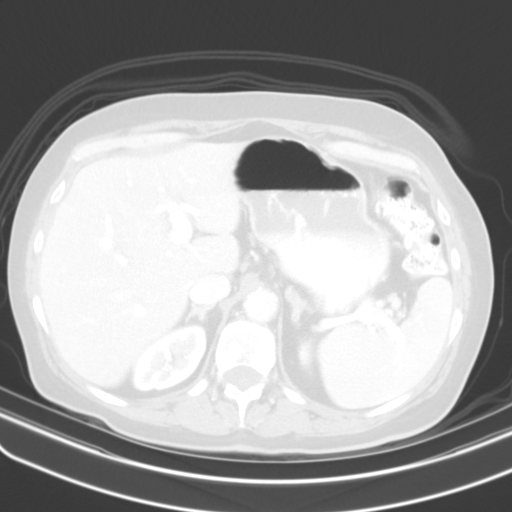
[im 81/96  soft-tissue]
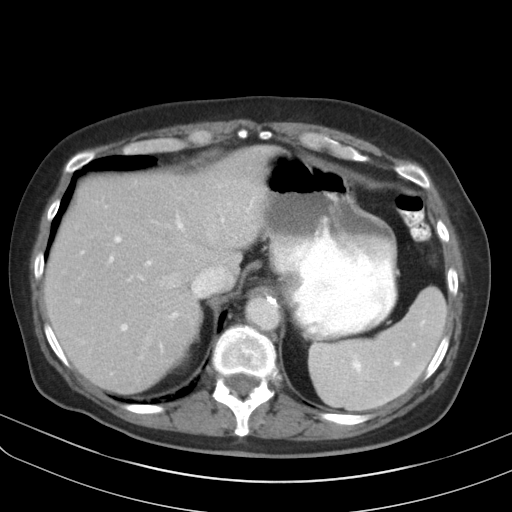
[im 81/96  lung]
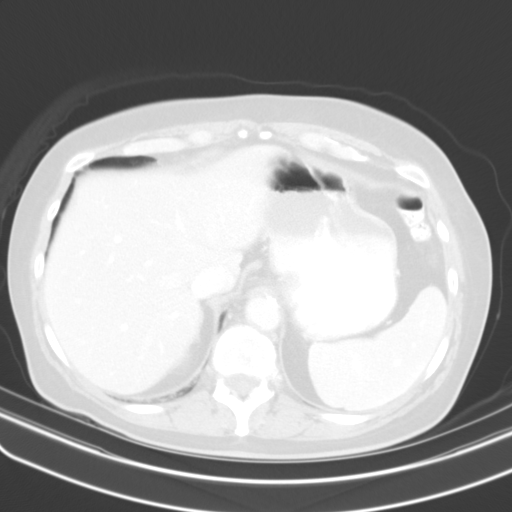
[im 86/96  lung]
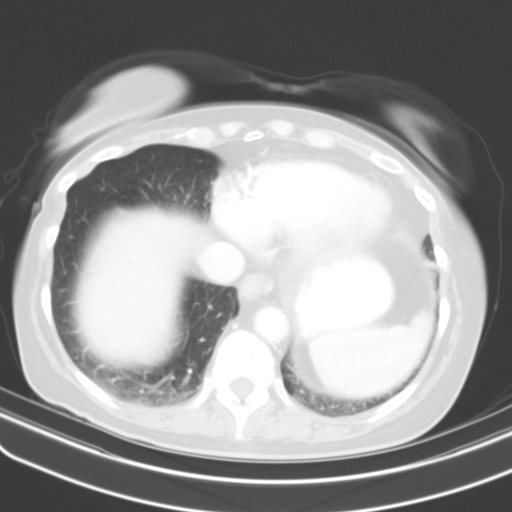
[im 91/96  soft-tissue]
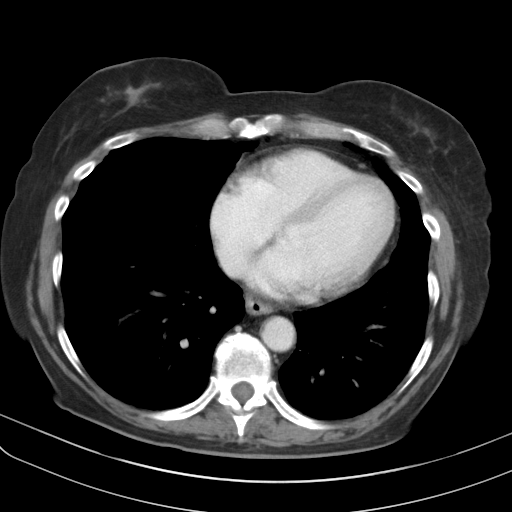
[im 91/96  lung]
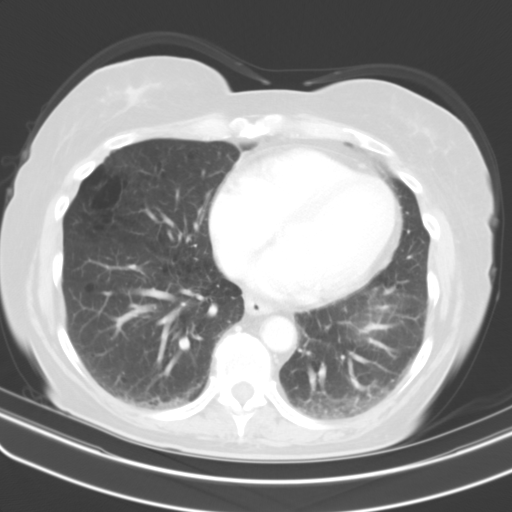

[14 of 32 positions shown; findings below may reference images not displayed]

FINDINGS: Lower chest: Emphysema and minimal atelectasis/ scarring at the lung
bases noted.

Hepatobiliary: The liver and gallbladder are unremarkable. There is
no evidence of biliary dilatation.

Pancreas: Unremarkable

Spleen: Unremarkable

Adrenals/Urinary Tract: The kidneys, adrenal glands and bladder are
unremarkable except for a right renal cyst.

Stomach/Bowel: Unremarkable. There is no evidence of bowel
obstruction or definite focal bowel wall thickening. The appendix is
normal.

Vascular/Lymphatic: Aortic atherosclerosis noted without aneurysm.
No enlarged lymph nodes identified.

Reproductive: Unremarkable

Other: No free fluid, abscess or pneumoperitoneum.

Musculoskeletal: No acute or suspicious abnormalities. Degenerative
changes in the lumbar spine are again noted.
IMPRESSION: No acute abnormalities.

No findings to suggest a cause for this patient's left
abdominal/pelvic pain.

Emphysema and aortic atherosclerosis.

## 2017-12-01 DIAGNOSIS — H01003 Unspecified blepharitis right eye, unspecified eyelid: Secondary | ICD-10-CM | POA: Diagnosis not present

## 2017-12-01 DIAGNOSIS — H47321 Drusen of optic disc, right eye: Secondary | ICD-10-CM | POA: Diagnosis not present

## 2017-12-01 DIAGNOSIS — H2513 Age-related nuclear cataract, bilateral: Secondary | ICD-10-CM | POA: Diagnosis not present

## 2017-12-01 DIAGNOSIS — H25013 Cortical age-related cataract, bilateral: Secondary | ICD-10-CM | POA: Diagnosis not present

## 2017-12-02 NOTE — Telephone Encounter (Signed)
Follow up at office appointment 12/19/17

## 2017-12-05 DIAGNOSIS — R7303 Prediabetes: Secondary | ICD-10-CM | POA: Diagnosis not present

## 2017-12-05 DIAGNOSIS — R600 Localized edema: Secondary | ICD-10-CM | POA: Diagnosis not present

## 2017-12-05 DIAGNOSIS — Z79899 Other long term (current) drug therapy: Secondary | ICD-10-CM | POA: Diagnosis not present

## 2017-12-05 DIAGNOSIS — N3000 Acute cystitis without hematuria: Secondary | ICD-10-CM | POA: Diagnosis not present

## 2017-12-05 DIAGNOSIS — R5383 Other fatigue: Secondary | ICD-10-CM | POA: Diagnosis not present

## 2017-12-05 DIAGNOSIS — R300 Dysuria: Secondary | ICD-10-CM | POA: Diagnosis not present

## 2017-12-19 ENCOUNTER — Encounter: Payer: Self-pay | Admitting: Cardiology

## 2017-12-19 ENCOUNTER — Ambulatory Visit (INDEPENDENT_AMBULATORY_CARE_PROVIDER_SITE_OTHER): Payer: Medicare Other | Admitting: Cardiology

## 2017-12-19 VITALS — BP 114/58 | HR 83 | Ht 65.5 in | Wt 122.0 lb

## 2017-12-19 DIAGNOSIS — Z9861 Coronary angioplasty status: Secondary | ICD-10-CM

## 2017-12-19 DIAGNOSIS — R002 Palpitations: Secondary | ICD-10-CM

## 2017-12-19 DIAGNOSIS — I10 Essential (primary) hypertension: Secondary | ICD-10-CM

## 2017-12-19 DIAGNOSIS — I951 Orthostatic hypotension: Secondary | ICD-10-CM | POA: Diagnosis not present

## 2017-12-19 DIAGNOSIS — I25119 Atherosclerotic heart disease of native coronary artery with unspecified angina pectoris: Secondary | ICD-10-CM

## 2017-12-19 DIAGNOSIS — I2510 Atherosclerotic heart disease of native coronary artery without angina pectoris: Secondary | ICD-10-CM

## 2017-12-19 DIAGNOSIS — E785 Hyperlipidemia, unspecified: Secondary | ICD-10-CM

## 2017-12-19 DIAGNOSIS — I714 Abdominal aortic aneurysm, without rupture, unspecified: Secondary | ICD-10-CM

## 2017-12-19 DIAGNOSIS — I208 Other forms of angina pectoris: Secondary | ICD-10-CM | POA: Diagnosis not present

## 2017-12-19 NOTE — Patient Instructions (Addendum)
Medication Instructions: Dr [name] has recommended making the following medication changes: 1. STOP Pravastatin for 1 month - KEEP taking your Pravastatin until you have your labs done, then STOP >>AFTER 1 MONTH  If your leg pain DOES NOT get better - RESTART Pravastatin at your usual dose (40 mg daily)  If your leg pain DOES get better - RESTART Pravastatin at HALF of your usual dose (20 mg daily)  Labwork: Your physician recommends that you return for lab work at your earliest [place].  Testing/Procedures: NONE ORDERED  Follow-up: Dr [name] recommends that you schedule a follow-up appointment in 6 months. You will receive a reminder letter in the mail two months in advance. If you don't receive a letter, please call our office to schedule the follow-up appointment.  If you need a refill on your cardiac medications before your next appointment, please call your pharmacy.   Dr [name] recommends that you drink a glass of water before you go to bed and when you wake up to help with your palpitations at night.
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
## 2017-12-21 ENCOUNTER — Encounter: Payer: Self-pay | Admitting: Cardiology

## 2017-12-21 ENCOUNTER — Other Ambulatory Visit: Payer: Self-pay | Admitting: Cardiology

## 2017-12-21 DIAGNOSIS — Z9861 Coronary angioplasty status: Principal | ICD-10-CM

## 2017-12-21 DIAGNOSIS — I2510 Atherosclerotic heart disease of native coronary artery without angina pectoris: Secondary | ICD-10-CM | POA: Insufficient documentation

## 2017-12-21 NOTE — Assessment & Plan Note (Signed)
At this point, I think she may only tolerate low-dose Imdur.  We may need to stop that. I stressed the importance of adequate hydration.  If this does not work, we would need to consider midodrine.

## 2017-12-21 NOTE — Assessment & Plan Note (Signed)
Labs last checked a year ago.  She is due for follow-up now.  For now continue pravastatin until after labs checked,  then allow for 1 month statin holiday.    I would not change medications on her otherwise because of so many intolerances.

## 2017-12-21 NOTE — Assessment & Plan Note (Addendum)
No longer an issue.  Unable to tolerate any blood pressure medications beyond Imdur.

## 2017-12-21 NOTE — Assessment & Plan Note (Signed)
This continues to be more of her biggest complaints, however we have never shown any arrhythmias or significant ectopy on several different evaluations.  At this point not can evaluate further, she cannot tolerate beta-blockers.  We are simply cannot have first off of all beta-blockers, calcium channel blockers etc. to avoid orthostatic syncope.

## 2017-12-21 NOTE — Assessment & Plan Note (Signed)
bHad planned to check Abd Aortic CTA in August -- she asked about this.   I think we can hold off & wait until she has Cancer related CT scans -- if there remains concern, can check CTA abdomen.

## 2017-12-21 NOTE — Assessment & Plan Note (Signed)
She has now had 2 Myoview ST (2016 & 2017) to evaluate her chest pains -- current Sx are clearly MSK related. --> would not evaluated with ST.  Unfortunately, we are having to d/c all but her Imdur - unable to tolerate even low dose Beta BlocKer (with frequent hypotension/syncope).  Continue Plavix for secondary prevention. Is on pravastatin -but will allow her to have a 1 month statin holiday because of leg cramping.

## 2017-12-22 DIAGNOSIS — E785 Hyperlipidemia, unspecified: Secondary | ICD-10-CM | POA: Diagnosis not present

## 2017-12-22 DIAGNOSIS — I25119 Atherosclerotic heart disease of native coronary artery with unspecified angina pectoris: Secondary | ICD-10-CM | POA: Diagnosis not present

## 2017-12-22 LAB — HEPATIC FUNCTION PANEL
ALT: 7 IU/L (ref 0–32)
AST: 13 IU/L (ref 0–40)
Albumin: 4.1 g/dL (ref 3.6–4.8)
Alkaline Phosphatase: 72 IU/L (ref 39–117)
Bilirubin Total: 0.4 mg/dL (ref 0.0–1.2)
Bilirubin, Direct: 0.08 mg/dL (ref 0.00–0.40)
Total Protein: 5.9 g/dL — ABNORMAL LOW (ref 6.0–8.5)

## 2017-12-22 LAB — LIPID PANEL
Chol/HDL Ratio: 3.5 ratio (ref 0.0–4.4)
Cholesterol, Total: 156 mg/dL (ref 100–199)
HDL: 44 mg/dL (ref >39)
LDL Calculated: 90 mg/dL (ref 0–99)
Triglycerides: 110 mg/dL (ref 0–149)
VLDL Cholesterol Cal: 22 mg/dL (ref 5–40)

## 2017-12-29 DIAGNOSIS — N3020 Other chronic cystitis without hematuria: Secondary | ICD-10-CM | POA: Diagnosis not present

## 2017-12-29 DIAGNOSIS — N393 Stress incontinence (female) (male): Secondary | ICD-10-CM | POA: Diagnosis not present

## 2017-12-29 DIAGNOSIS — R350 Frequency of micturition: Secondary | ICD-10-CM | POA: Diagnosis not present

## 2018-01-19 ENCOUNTER — Ambulatory Visit (INDEPENDENT_AMBULATORY_CARE_PROVIDER_SITE_OTHER): Payer: Medicare Other | Admitting: Pulmonary Disease

## 2018-01-19 ENCOUNTER — Encounter: Payer: Self-pay | Admitting: Pulmonary Disease

## 2018-01-19 VITALS — BP 124/80 | HR 80 | Ht 65.0 in | Wt 123.0 lb

## 2018-01-19 DIAGNOSIS — J449 Chronic obstructive pulmonary disease, unspecified: Secondary | ICD-10-CM

## 2018-01-19 DIAGNOSIS — I208 Other forms of angina pectoris: Secondary | ICD-10-CM | POA: Diagnosis not present

## 2018-01-19 MED ORDER — FLUTICASONE FUROATE 100 MCG/ACT IN AEPB: 1.0000 | INHALATION_SPRAY | Freq: Every day | RESPIRATORY_TRACT | 5 refills | Status: DC

## 2018-01-19 NOTE — Progress Notes (Signed)
[facility]  Chief Complaint  Patient presents with  . Follow-up    pt states breathing has worsen since last. prod cough with white mucus, sob with exertion & wheezing    Constitutional: BP 124/80 (BP Location: Left Arm, Cuff Size: Normal)   Pulse 80   Ht 5\' 5"  (1.651 m)   Wt 123 lb (55.8 kg)   SpO2 98%   BMI 20.47 kg/m   History of Present Illness: Michele [name] is a 62 y.o. female former smoker with COPD/emphysema.  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  She is still having cough with chest congestion.  Bringing up clear sputum.  Has occasional wheezing.  Not having fever, or hemoptysis.  Has vague chest, shoulder discomfort that comes sporadically.  She was concerned about having pneumonia and her cancer is coming back.  She was also concerned that a doctor previously told her that she has an aneurysm, but this was not seen on imaging studies from August 2018 to present.  CT chest from 08/19/17 showed changes of emphysema, and CXR from 11/19/17 showed changes of emphysema (both reviewed personally by me).   Comprehensive Respiratory Exam:  Appearance - well kempt  ENMT - nasal mucosa moist, turbinates clear, midline nasal septum, no dental lesions, no gingival bleeding, no oral exudates, no tonsillar hypertrophy Neck - no masses, trachea midline, no thyromegaly, no elevation in JVP Respiratory - normal appearance of chest wall, normal respiratory effort w/o accessory muscle use, no dullness on percussion, no wheezing or rales CV - s1s2 regular rate and rhythm, no murmurs, no peripheral edema, radial pulses symmetric GI - soft, non tender, no masses Lymph - no adenopathy noted in neck and axillary areas MSK - normal muscle strength and tone, normal gait Ext - no cyanosis, clubbing, or joint inflammation noted Skin - no rashes, lesions, or ulcers Neuro - oriented to person, place, and time Psych -  anxious  Assessment/Plan:  COPD with emphysema and chronic bronchitis. - will try adding inhaled steroid with arnuity >> explained this is different than systemic steroids and albuterol - continue spiriva and prn xopenex - prn mucinex - she is intolerant of LABAs due to palpitations - might consider adding flutter valve - no clinical evidence to suggest pneumonia >> defer ABx  Hx of small cell lung cancer. - she will f/u with Dr. Julien Nordmann - explained that there wasn't evidence for recurrence on CT chest from 08/29/17 or CXR from 11/19/17   Patient Instructions  Continue using spiriva daily Arnuity 1 puff daily, and rinse mouth after each use Levalbuterol two puffs every 4 to 6 hours as needed for cough, wheeze, chest congestion, or shortness of breath Can use mucinex twice per day as needed when you have cough with chest congestion Will arrange for overnight oxygen test  Follow up in 3 months   [name], MD [place] 01/19/2018, 12:54 PM Pager:  [phone]  Flow Sheet  Pulmonary tests: PFT 12/23/11 >> FEV1 2.13 (85%), FEV1% 59, TLC 5.98 (112%), DLCO 60% CT chest 08/24/16 >> CAD, mod/severe emphysema CT chest 08/29/17 >> severe emphysema Spirometry 11/19/17 >> FEV1 2.58 (100%), FEV1% 76 FeNO 11/19/17 >> 5  Cardiac tests Echo 08/30/17 >> EF 60 to 65%, grade 1 DD, mild MR  Past Medical History: She  has a past medical history of Anginal pain ([place]), ANXIETY, Arthritis, Asthma, Borderline hypertension, CAD S/P percutaneous coronary angioplasty (5&6/'13; 6/'14), Cataract, Chronic kidney disease,  Collagen vascular disease ([place]), CONTACT DERMATITIS&OTHER ECZEMA DUE UNSPEC CAUSE, COPD, DEPRESSION, DERMATOFIBROMA, DYSLIPIDEMIA, Dysrhythmia, Emphysema of lung ([place]), Encounter for antineoplastic chemotherapy (03/12/2016), GERD, Hepatitis, Hiatal hernia, History of radiation therapy (10-12/'17, 1-2/'18), History ST elevation myocardial infarction (STEMI) of  inferolateral wall (10/2011), Hypertension, INSOMNIA, KNEE PAIN, CHRONIC, LOW BACK PAIN, RESTLESS LEG SYNDROME, Seizures ([place]), Shortness of breath dyspnea, Small cell lung carcinoma (HCC) (02/26/2016), SPONDYLOSIS, CERVICAL, WITH RADICULOPATHY, Tobacco abuse, Tuberculosis, UTI (urinary tract infection), and VITAMIN D DEFICIENCY.  Past Surgical History: She  has a past surgical history that includes Leg wound repair / closure (3474); Coronary angioplasty with stent (10/10/11); Coronary angioplasty with stent (11/06/11); Tonsillectomy; Tubal ligation (1970's); Knee surgery; Coronary angioplasty with stent (11/19/2012); NM MYOVIEW LTD (October 2013; 12/2013); CPET (09/07/2012); Cardiac catheterization (03/02/2014); left heart catheterization with coronary angiogram (N/A, 10/10/2011); percutaneous coronary stent intervention (pci-s) (N/A, 11/06/2011); left heart catheterization with coronary angiogram (N/A, 11/19/2012); left heart catheterization with coronary angiogram (N/A, 02/19/2013); left heart catheterization with coronary angiogram (N/A, 03/02/2014); Colonoscopy; Polypectomy; Video bronchoscopy with endobronchial ultrasound (N/A, 02/14/2016); Direct laryngoscopy (N/A, 02/14/2016); OTHER SURGICAL HISTORY; Breast biopsy (2000's); NM MYOVIEW LTD (02/2016); lipoma surgery (Left, 10/2016); transthoracic echocardiogram (May 2013; September 2015); and transthoracic echocardiogram (08/30/2017).  Family History: Her family history includes Asthma in her mother; Cancer in her maternal grandmother; Colon polyps in her mother; Diabetes in her mother; Emphysema in her mother; Heart attack in her maternal grandfather; Heart disease in her father and mother; Hyperlipidemia in her mother; Hypertension in her mother; Kidney cancer in her brother; Stomach cancer in her brother and brother; Stroke in her mother; Stroke (age of onset: 46) in her brother.  Social History: She  reports that she quit smoking about 5 months ago. Her smoking  use included cigarettes. She has a 60.00 pack-year smoking history. She has never used smokeless tobacco. She reports that she does not drink alcohol or use drugs.  Medications: Allergies as of 01/19/2018      Reactions   Ciprofloxacin Other (See Comments)   Hypoglycemia    Aspirin Other (See Comments)   GI upset; patient is only able to take enteric coated Aspirin.     Crestor [rosuvastatin] Other (See Comments)   Muscle pain    Ibuprofen Other (See Comments)   GI upset    Wellbutrin [bupropion] Palpitations   Zoloft [sertraline Hcl] Other (See Comments)   Insomnia, fatigue    Albuterol Palpitations   Lipitor [atorvastatin] Other (See Comments)   Muscle pain    Sulfonamide Derivatives Itching, Rash      Medication List        Accurate as of 01/19/18 12:54 PM. Always use your most recent med list.          ALPRAZolam 1 MG tablet Commonly known as:  XANAX Take 1 mg by mouth 3 (three) times daily.   CALCIUM 600+D 600-400 MG-UNIT tablet Generic drug:  Calcium Carbonate-Vitamin D Take 1 tablet by mouth daily.   clopidogrel 75 MG tablet Commonly known as:  PLAVIX TAKE 1 TABLET(75 MG) BY MOUTH DAILY   CoQ10 100 MG Caps Take 100 mg by mouth daily.   cycloSPORINE 0.05 % ophthalmic emulsion Commonly known as:  RESTASIS 1 drop as directed.   Fluticasone Furoate 100 MCG/ACT Aepb Inhale 1 puff into the lungs daily.   isosorbide mononitrate 30 MG 24 hr tablet Commonly known as:  IMDUR Take 30 mg by mouth as directed.   levalbuterol 45 MCG/ACT inhaler Commonly known as:  XOPENEX HFA  Inhale 1-2 puffs into the lungs every 6 (six) hours as needed for wheezing.   nitroGLYCERIN 0.4 MG SL tablet Commonly known as:  NITROSTAT DISSOLVE 1 TABLET UNDER THE TONGUE EVERY 5 MINUTES AS NEEDED FOR CHEST PAIN   pantoprazole 40 MG tablet Commonly known as:  PROTONIX Take 40 mg by mouth daily.   polyethylene glycol packet Commonly known as:  MIRALAX / GLYCOLAX Take 17 g by mouth  as directed.   pravastatin 40 MG tablet Commonly known as:  PRAVACHOL TAKE 1 TABLET(40 MG) BY MOUTH EVERY EVENING   tiotropium 18 MCG inhalation capsule Commonly known as:  SPIRIVA Place 18 mcg into inhaler and inhale daily.   VASCEPA 1 g Caps Generic drug:  Icosapent Ethyl TAKE 1 CAPSULES BY MOUTH TWICE DAILY

## 2018-01-19 NOTE — Patient Instructions (Addendum)
Continue using spiriva daily Arnuity 1 puff daily, and rinse mouth after each use Levalbuterol two puffs every 4 to 6 hours as needed for cough, wheeze, chest congestion, or shortness of breath Can use mucinex twice per day as needed when you have cough with chest congestion Will arrange for overnight oxygen test  Follow up in 3 months

## 2018-01-23 ENCOUNTER — Encounter: Payer: Self-pay | Admitting: Pulmonary Disease

## 2018-01-23 IMAGING — CR DG ABDOMEN 1V
1 series · 1 of 1 positions shown · non-contrast
Comparison: CT 08/03/2015

CLINICAL DATA: No bowel movement for 4 days.

EXAM:
ABDOMEN - 1 VIEW

[abdomen kub]
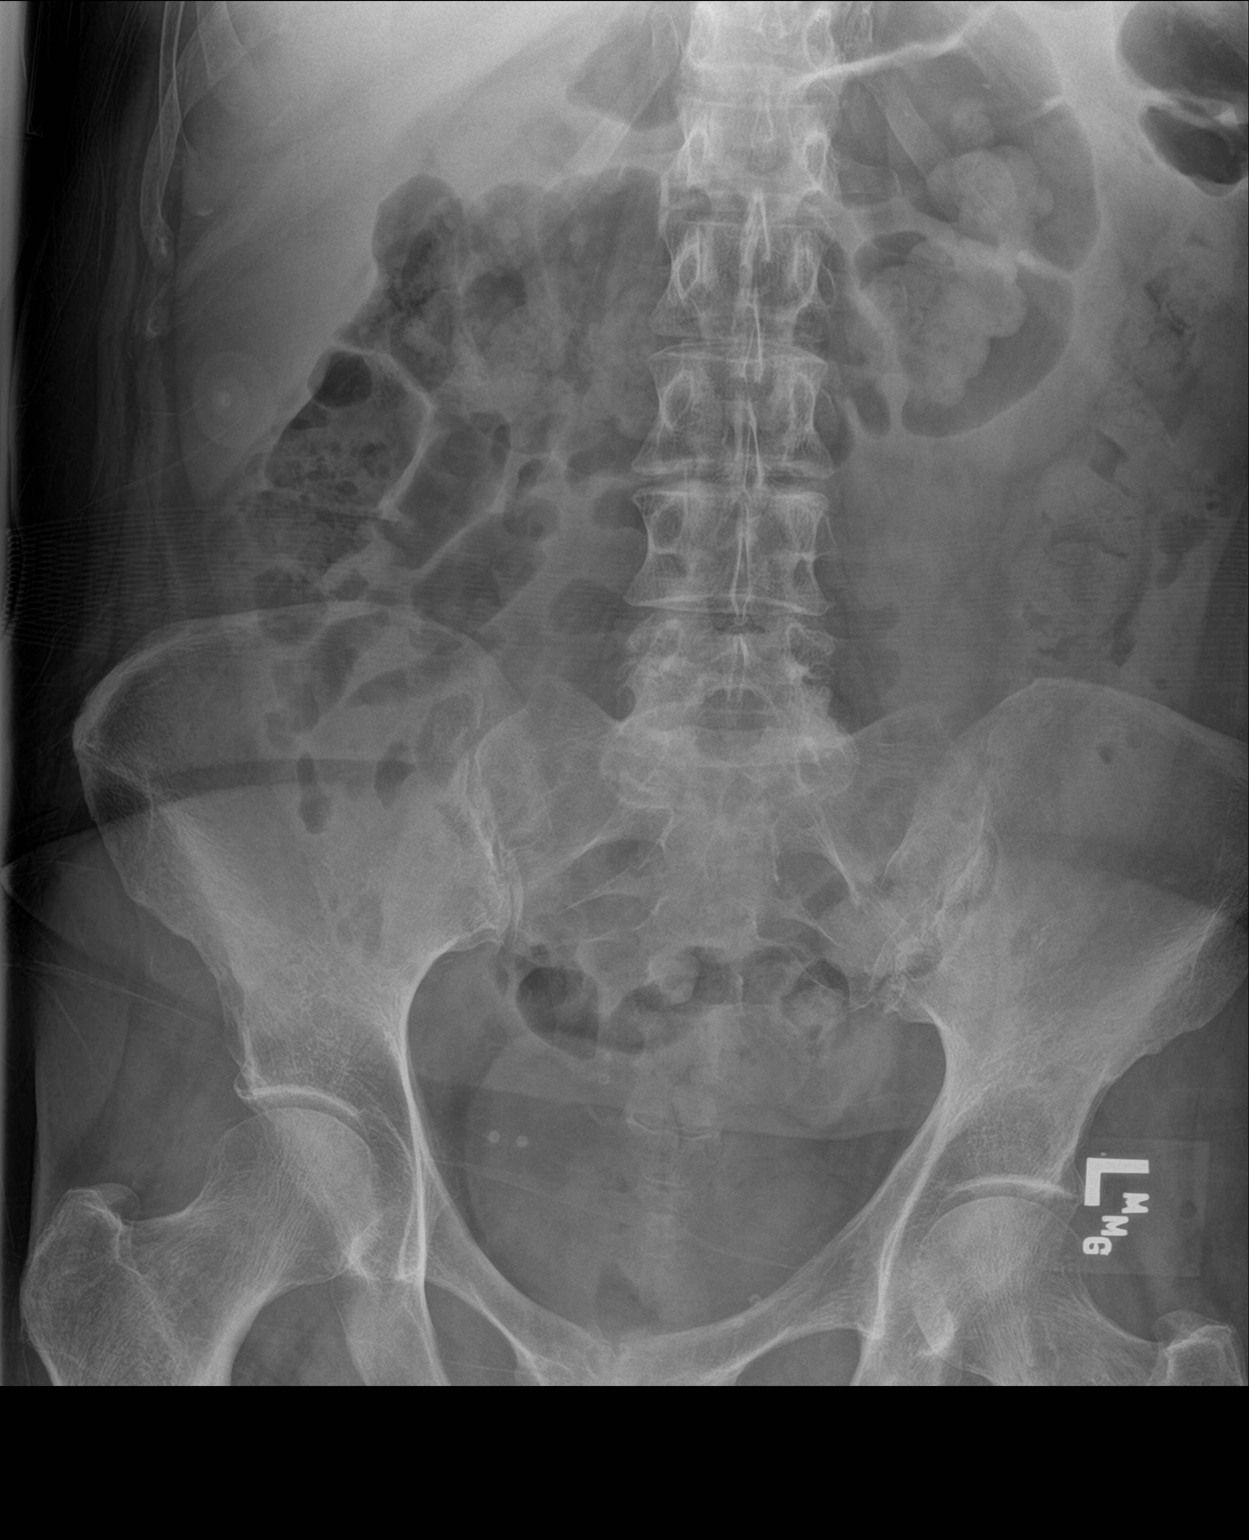

[1 of 1 positions shown; findings below may reference images not displayed]

FINDINGS: Nonobstructive bowel gas pattern. No abnormal stool retention or
impaction. No concerning intra-abdominal mass effect or
calcification.
IMPRESSION: Negative study.  No abnormal stool retention.

## 2018-01-23 IMAGING — CR DG CHEST 2V
2 series · 2 of 2 positions shown · non-contrast
Comparison: 07/12/2015

CLINICAL DATA: Cough with left-sided chest pain.

EXAM:
CHEST  2 VIEW

[chest pa]
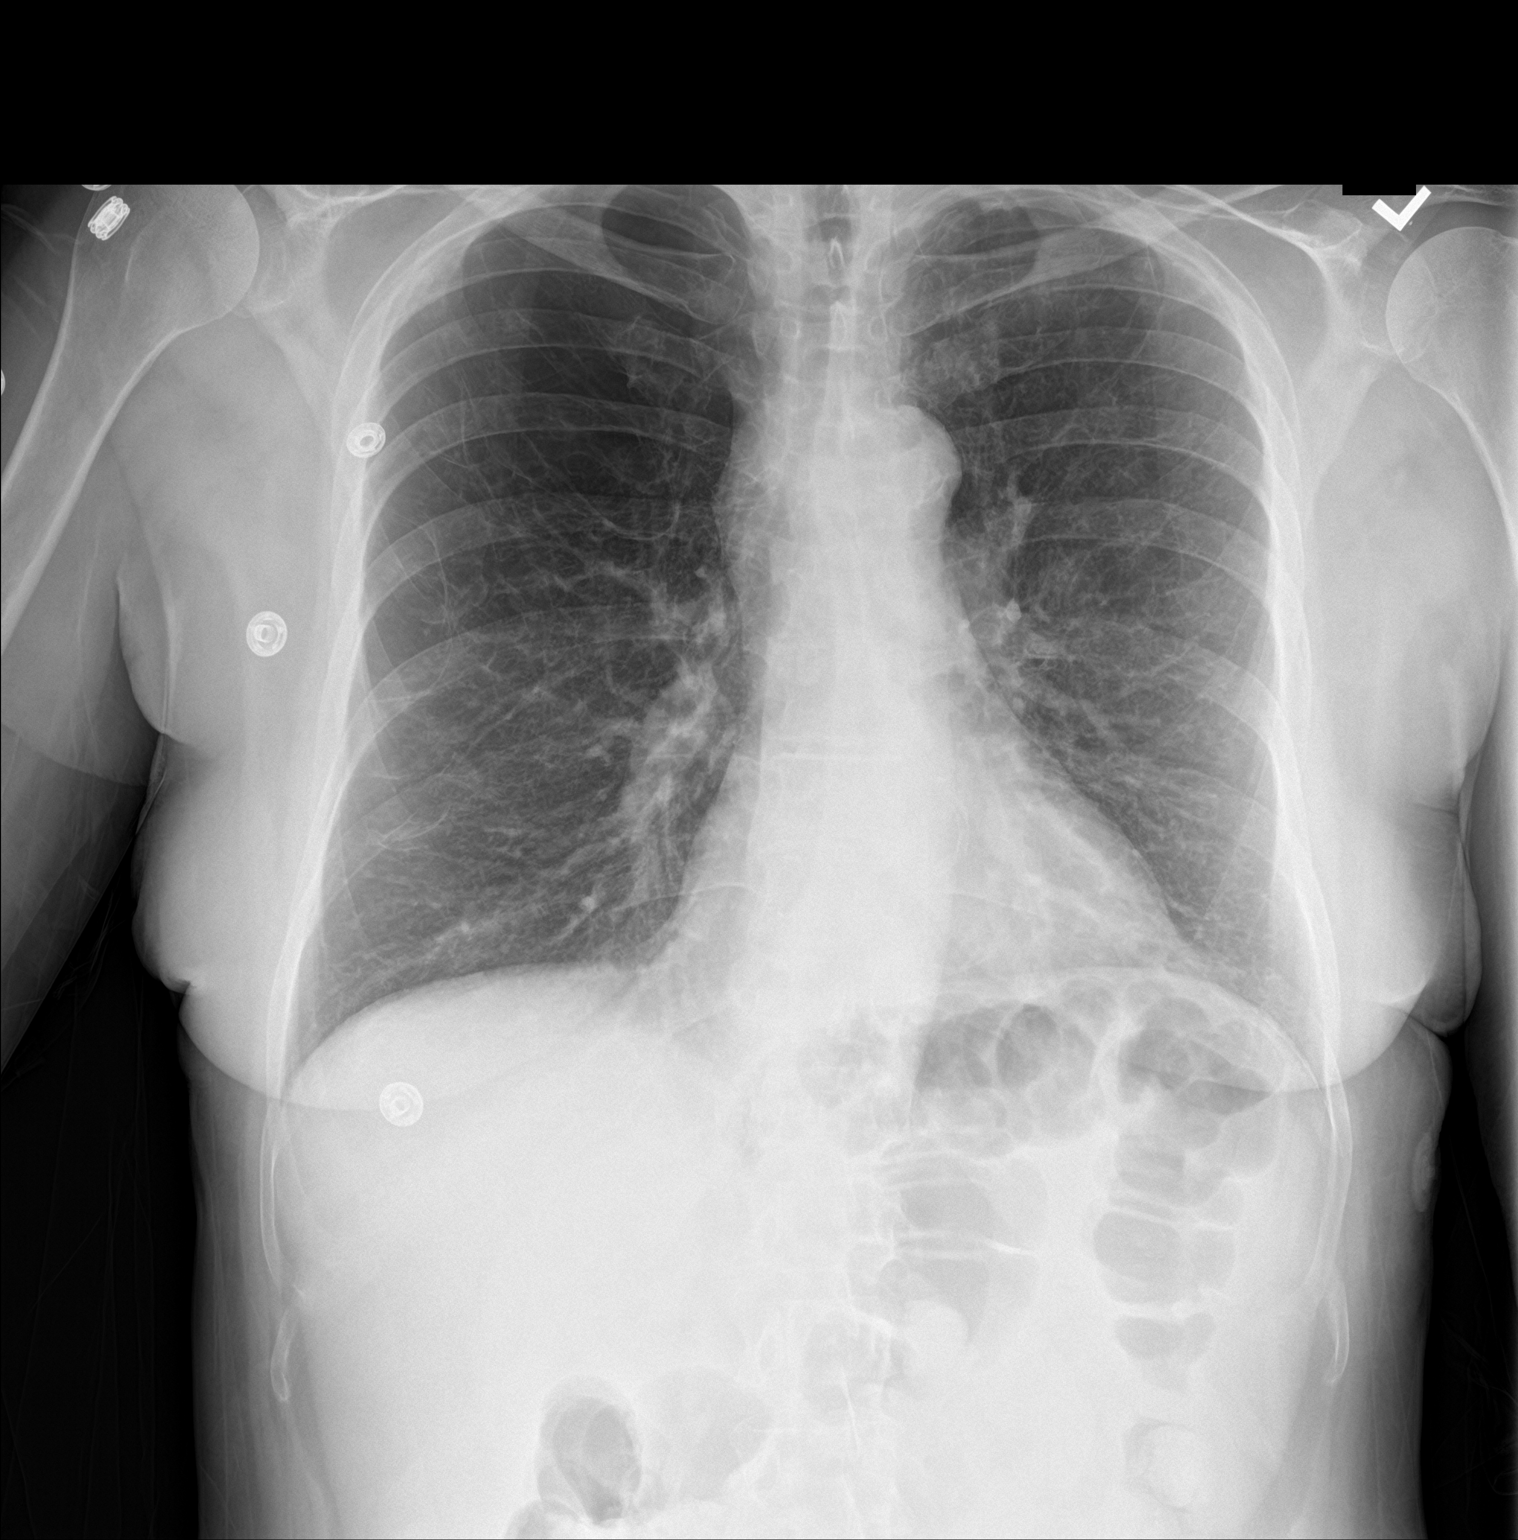

[chest lat]
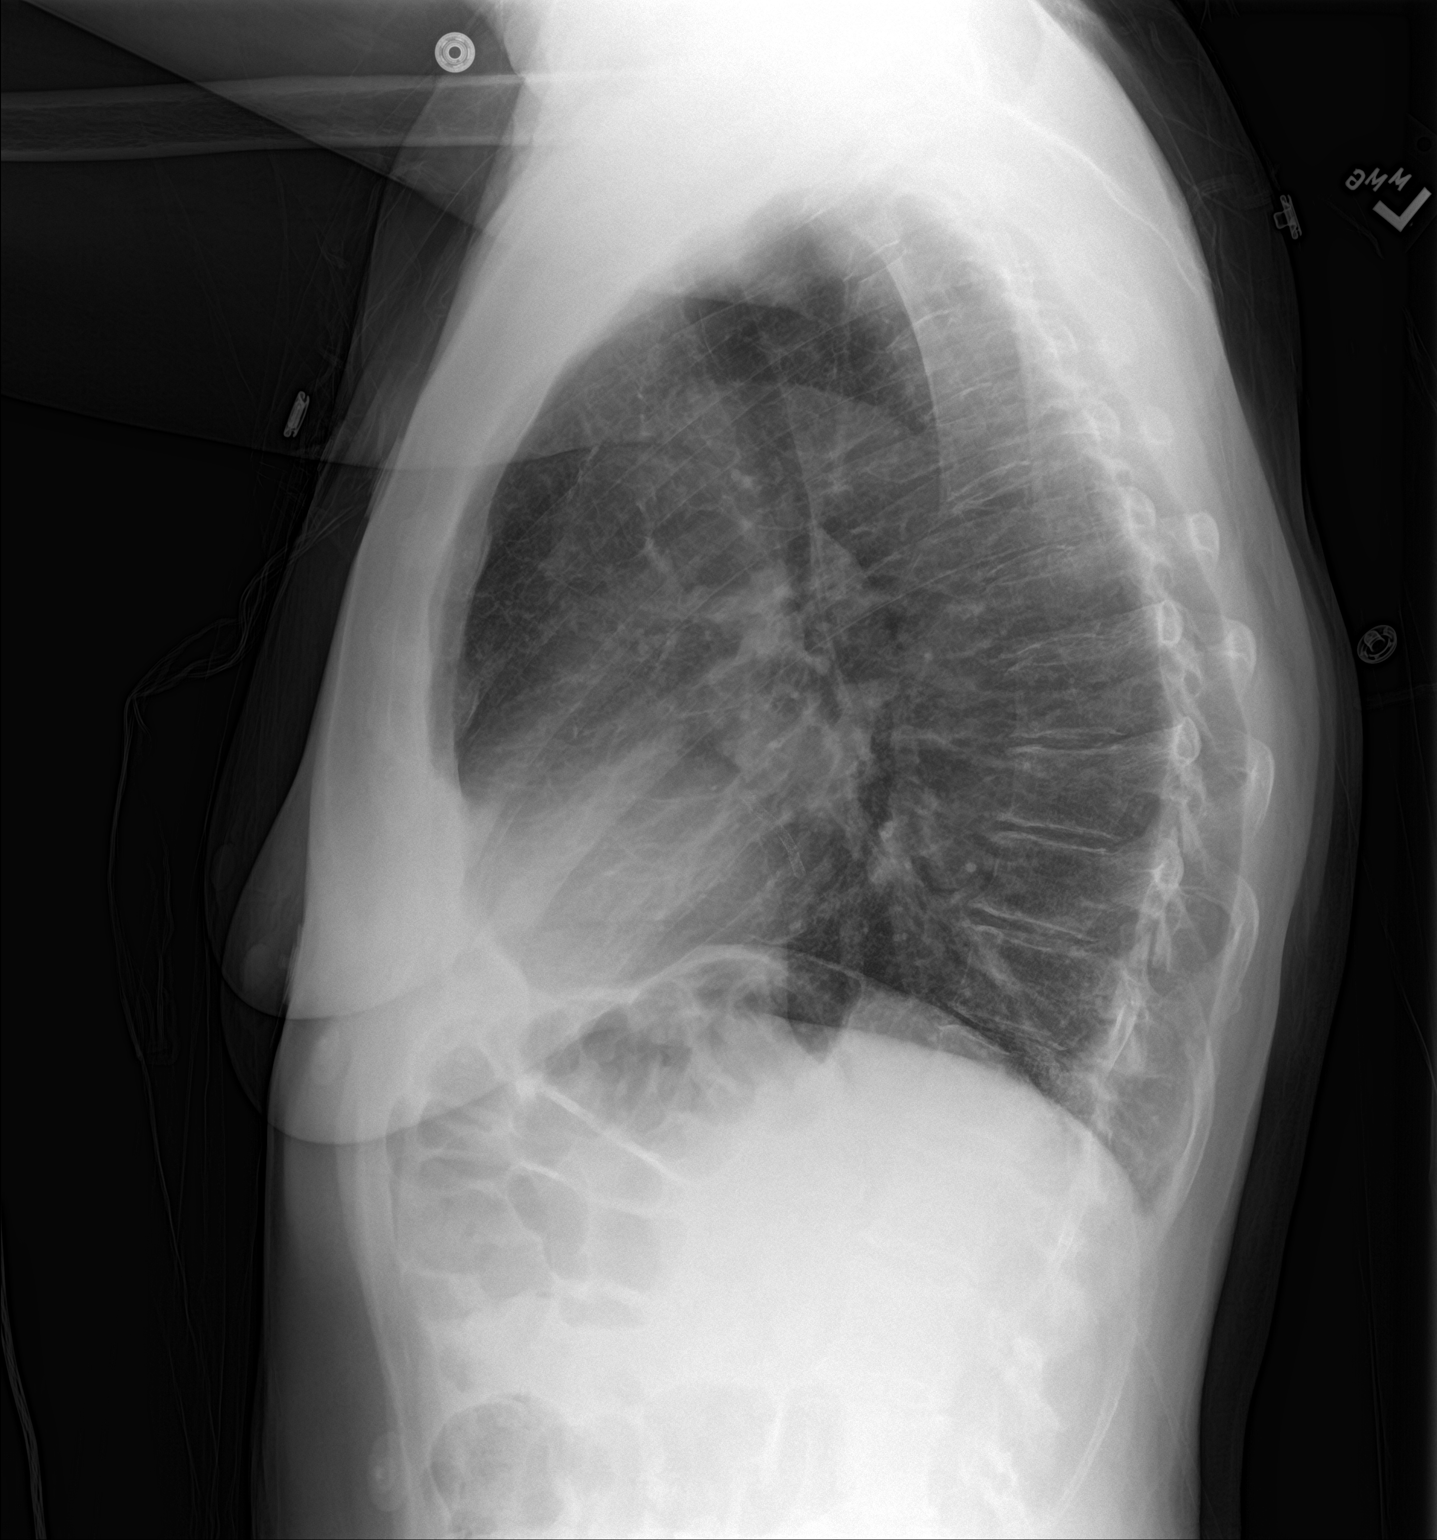

[2 of 2 positions shown; findings below may reference images not displayed]

FINDINGS: Emphysema with bullous changes greater on the right. There is no
edema, consolidation, effusion, or pneumothorax. Normal heart size
and mediastinal contours. Coronary atherosclerotic calcification or
stent noted.
IMPRESSION: Emphysema without acute superimposed finding.

## 2018-01-27 ENCOUNTER — Telehealth: Payer: Self-pay | Admitting: Cardiology

## 2018-01-27 DIAGNOSIS — Z79899 Other long term (current) drug therapy: Secondary | ICD-10-CM

## 2018-01-27 DIAGNOSIS — E785 Hyperlipidemia, unspecified: Secondary | ICD-10-CM

## 2018-01-27 DIAGNOSIS — J449 Chronic obstructive pulmonary disease, unspecified: Secondary | ICD-10-CM | POA: Diagnosis not present

## 2018-01-27 MED ORDER — PRAVASTATIN SODIUM 20 MG PO TABS: 20.0000 mg | ORAL_TABLET | Freq: Every day | ORAL | 1 refills | Status: DC

## 2018-01-27 NOTE — Telephone Encounter (Signed)
Spoke with pt. Pt sts that she has completed her statin 1 month holiday. She did have some improvement in her leg pain and will resume Pravastatin 20mg  daily. Pt rqst an Rx be sent to her pharmacy. Pt has a lot of concerns regarding medication side effects, some concerns were addresses    Adv pt that I will send an update to [place]. We will call her back if Dr.Harding has any additional recommendations.  Pt agreeable and verbalized understanding

## 2018-01-27 NOTE — Telephone Encounter (Signed)
New Message    Pt c/o medication issue:  1. Name of Medication: pravastatin (PRAVACHOL) 40 MG tablet  2. How are you currently taking this medication (dosage and times per day)?   3. Are you having a reaction (difficulty breathing--STAT)?   4. What is your medication issue? Patient states that she was told to stop taking the pravastatIn for a month to see if her leg cramps stop. Then she was told to resume at 20mg  she says her script is for 40mg  so what does Dr. [name] wants her to do. Please call.

## 2018-01-28 ENCOUNTER — Telehealth: Payer: Self-pay | Admitting: Pulmonary Disease

## 2018-01-28 NOTE — Telephone Encounter (Signed)
ONO with RA 01/23/18 >> test time 4 hrs 45 min.  Basal SpO2 93%, low SpO2 90%.     Please let her know her overnight oxygen test was normal.  She does not need supplemental oxygen at night.

## 2018-01-28 NOTE — Telephone Encounter (Signed)
Called and spoke with patient regarding results.  Informed the patient of results and recommendations today. Pt verbalized understanding and denied any questions or concerns at this time.  Nothing further needed.  

## 2018-02-02 ENCOUNTER — Other Ambulatory Visit: Payer: Self-pay | Admitting: Cardiology

## 2018-02-02 DIAGNOSIS — E785 Hyperlipidemia, unspecified: Secondary | ICD-10-CM

## 2018-02-02 DIAGNOSIS — Z79899 Other long term (current) drug therapy: Secondary | ICD-10-CM

## 2018-02-06 IMAGING — US US TRANSVAGINAL NON-OB
1 series · 15 of 25 positions shown · non-contrast
Comparison: Abdominal and pelvic CT scan August 03, 2015.

CLINICAL DATA: Right lower quadrant and low pelvic pain for the
past year associated with constipation. History of tubal ligation ;
the patient is postmenopausal.

EXAM:
TRANSABDOMINAL AND TRANSVAGINAL ULTRASOUND OF PELVIS
TECHNIQUE: Both transabdominal and transvaginal ultrasound examinations of the
pelvis were performed. Transabdominal technique was performed for
global imaging of the pelvis including uterus, ovaries, adnexal
regions, and pelvic cul-de-sac. It was necessary to proceed with
endovaginal exam following the transabdominal exam to visualize the
endometrium and ovaries.

[Series 1: us transvaginal non-ob · 15 of 79 slices shown]
[im 1/79]
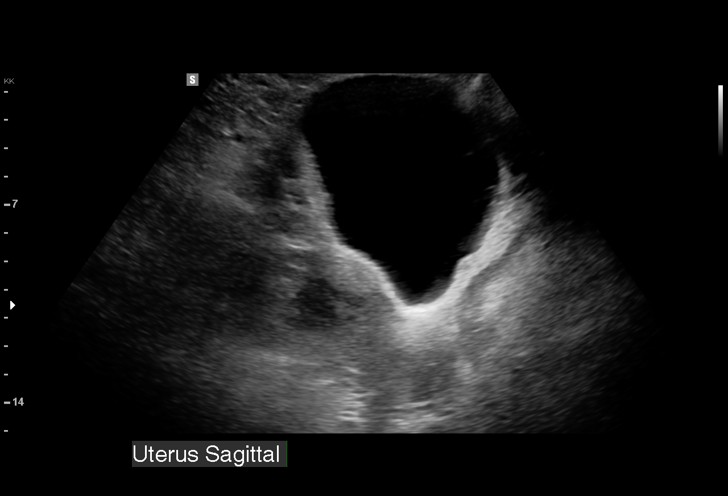
[im 7/79]
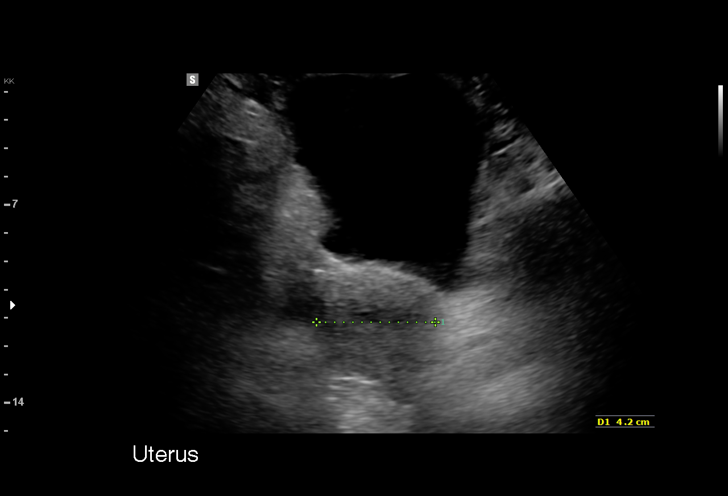
[im 14/79]
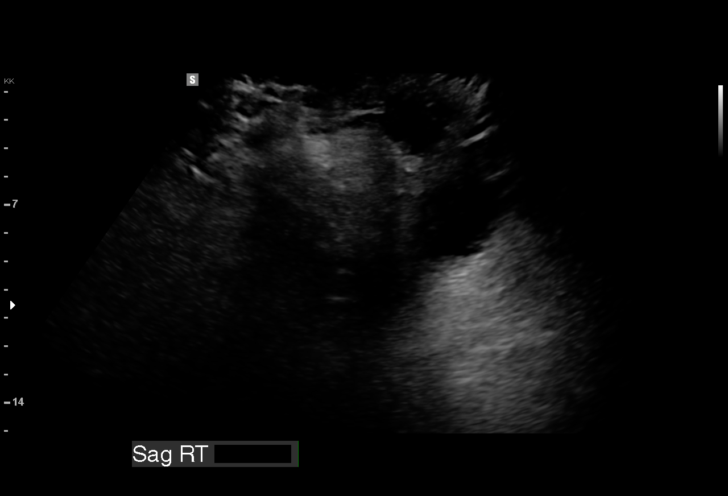
[im 17/79]
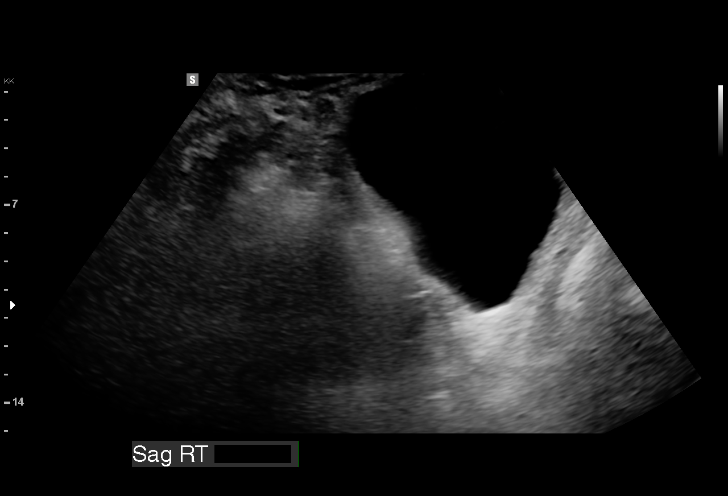
[im 23/79]
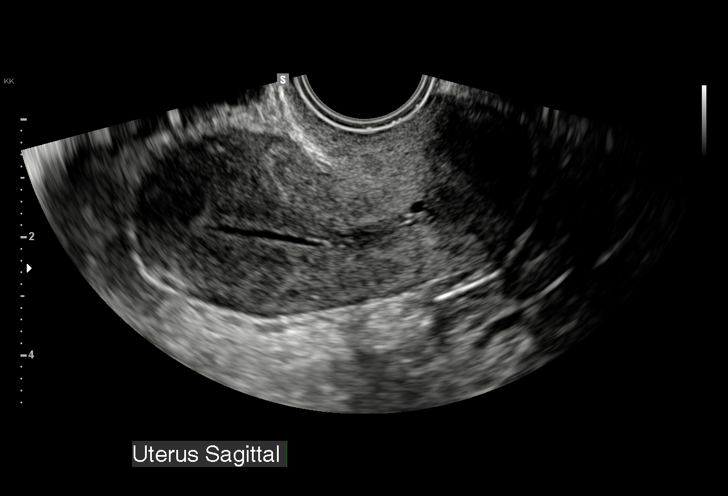
[im 30/79]
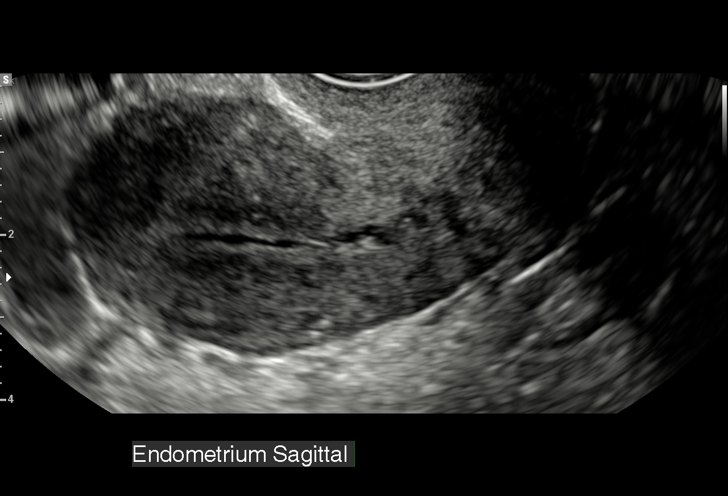
[im 33/79]
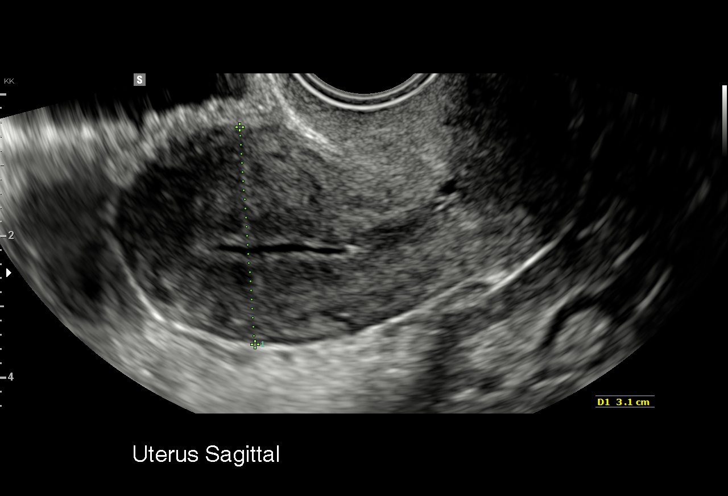
[im 40/79]
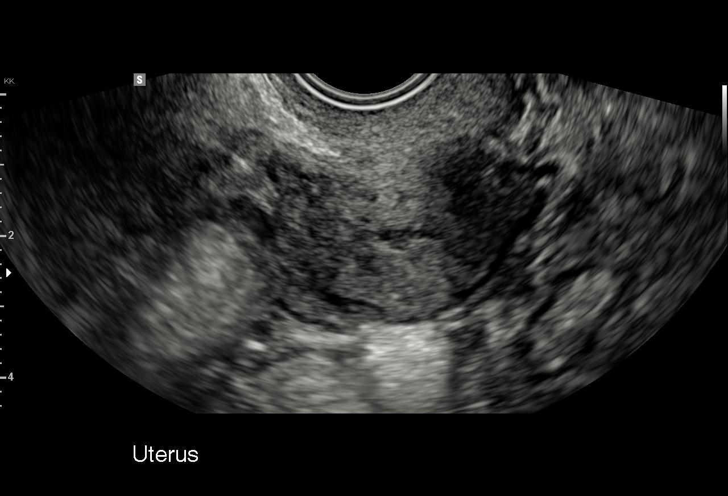
[im 46/79]
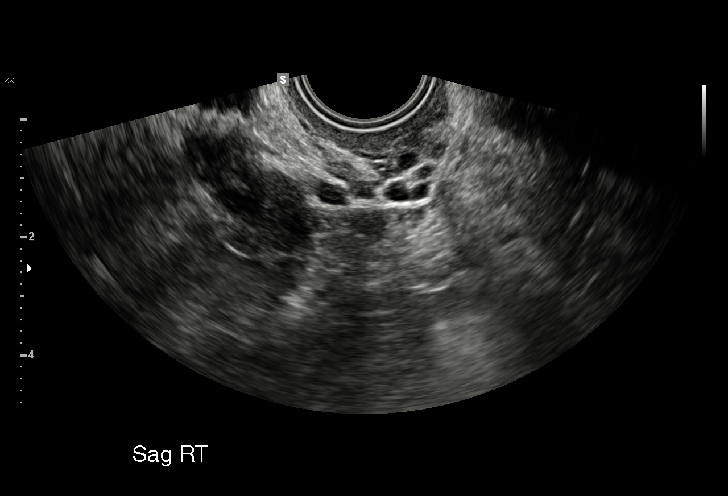
[im 49/79]
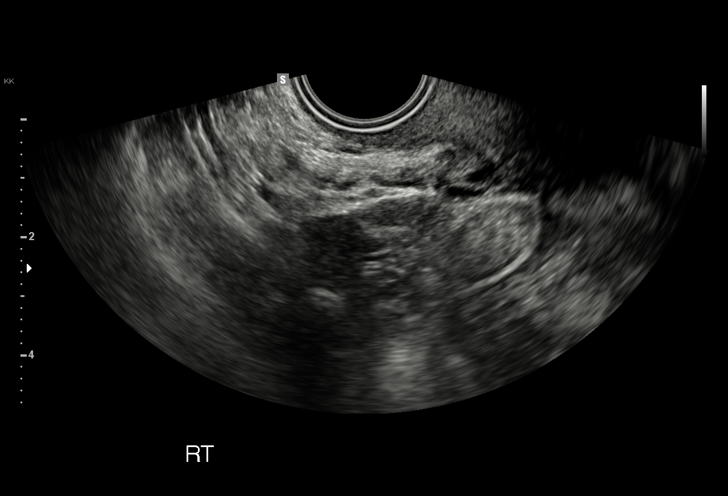
[im 56/79]
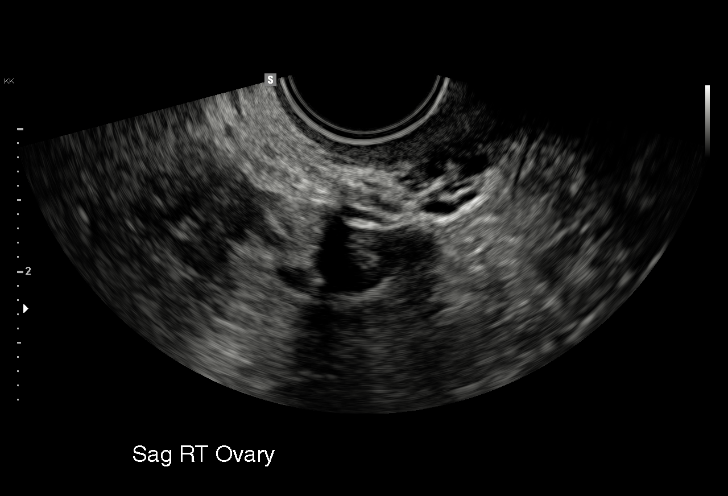
[im 62/79]
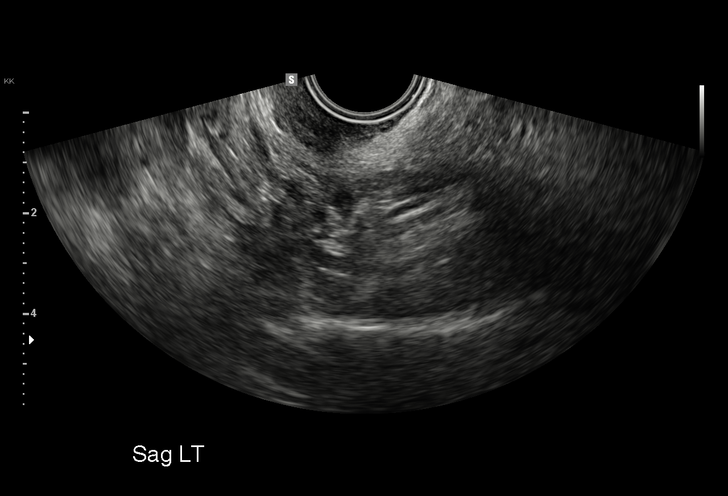
[im 66/79]
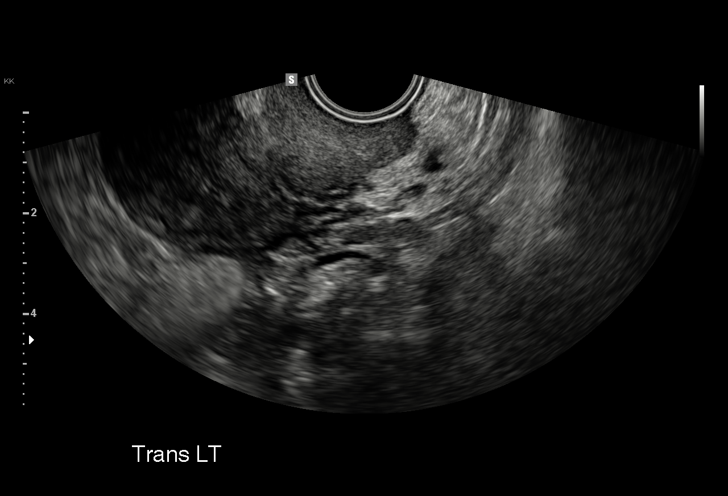
[im 72/79]
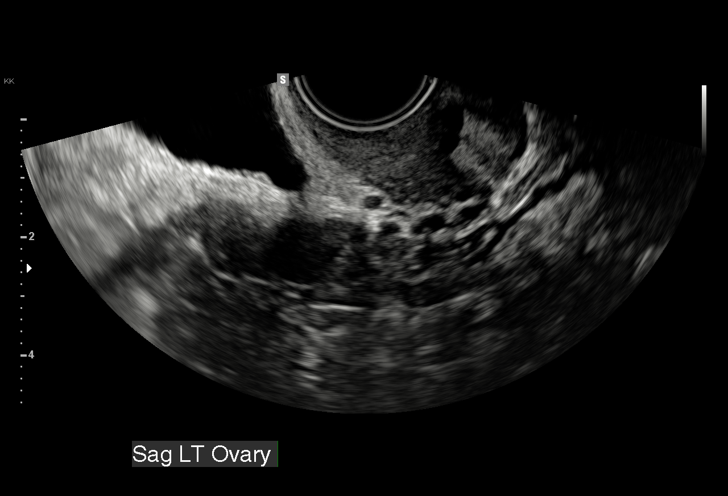
[im 79/79]
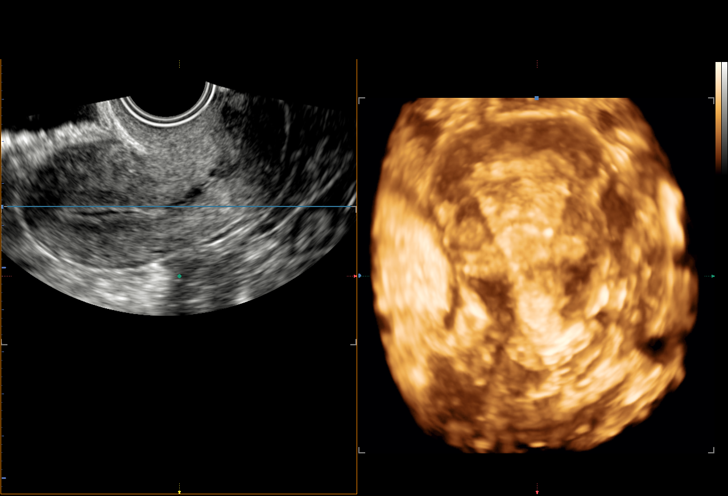

[15 of 25 positions shown; findings below may reference images not displayed]

FINDINGS: Uterus

Measurements: 7.4 x 3.1 x 3.9 cm. The uterus exhibits somewhat
heterogeneous echotexture. No discrete fibroid is observed.

Endometrium

Thickness: 3 mm. There is a small amount of fluid within the
endometrial cavity.

Right ovary

Measurements: 1.2 x 1.0 x 1.7 cm. Normal appearance/no adnexal mass.

Left ovary

Measurements: 1.8 x 1.2 x 1.5 cm. Normal appearance/no adnexal mass.

Other findings

No abnormal free fluid.
IMPRESSION: 1. No acute abnormality of the uterus or ovaries is observed. There
is a small amount of fluid within the endometrial cavity. This is
nonspecific. If there is vaginal spotting, sonohysterogram should be
considered for focal lesion work-up.
2. No pelvic masses or free fluid are observed.
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
## 2018-02-13 ENCOUNTER — Encounter: Payer: Self-pay | Admitting: Nurse Practitioner

## 2018-02-13 ENCOUNTER — Ambulatory Visit (INDEPENDENT_AMBULATORY_CARE_PROVIDER_SITE_OTHER): Payer: Medicare Other | Admitting: Nurse Practitioner

## 2018-02-13 ENCOUNTER — Ambulatory Visit: Payer: Self-pay | Admitting: Primary Care

## 2018-02-13 DIAGNOSIS — J438 Other emphysema: Secondary | ICD-10-CM

## 2018-02-13 NOTE — Assessment & Plan Note (Signed)
Patient Instructions  May stop annuity - patient concerned that is is causing heart palpitations Will give mucinex samples Continue all other medications Continue Spiriva Follow up with Dr. Halford Chessman in 1 month Keep follow up appt with oncology Please call cardiology about heart palpitations

## 2018-02-13 NOTE — Patient Instructions (Signed)
May stop annuity - patient concerned that is is causing heart palpitations Will give mucinex samples Continue all other medications Continue Spiriva Follow up with Dr. Halford Chessman in 1 month Keep follow up appt with oncology Please call cardiology about heart palpitations
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
## 2018-02-17 NOTE — Progress Notes (Signed)
Reviewed and agree with assessment/plan.   Yaqueline Gutter, MD [facility] 05/29/2016, 12:24 PM Pager:  [phone]  

## 2018-03-02 ENCOUNTER — Inpatient Hospital Stay: Payer: Medicare Other | Attending: Internal Medicine

## 2018-03-02 ENCOUNTER — Ambulatory Visit (HOSPITAL_COMMUNITY)
Admission: RE | Admit: 2018-03-02 | Discharge: 2018-03-02 | Disposition: A | Discharge status: Home / Self Care | Payer: Medicare Other | Source: Ambulatory Visit | Attending: Internal Medicine | Admitting: Internal Medicine

## 2018-03-02 ENCOUNTER — Encounter (HOSPITAL_COMMUNITY): Payer: Self-pay

## 2018-03-02 DIAGNOSIS — I700 Atherosclerosis of aorta: Secondary | ICD-10-CM | POA: Insufficient documentation

## 2018-03-02 DIAGNOSIS — Z85118 Personal history of other malignant neoplasm of bronchus and lung: Secondary | ICD-10-CM | POA: Diagnosis not present

## 2018-03-02 DIAGNOSIS — Z923 Personal history of irradiation: Secondary | ICD-10-CM | POA: Diagnosis not present

## 2018-03-02 DIAGNOSIS — C3491 Malignant neoplasm of unspecified part of right bronchus or lung: Secondary | ICD-10-CM | POA: Diagnosis not present

## 2018-03-02 DIAGNOSIS — J439 Emphysema, unspecified: Secondary | ICD-10-CM | POA: Diagnosis not present

## 2018-03-02 LAB — CBC WITH DIFFERENTIAL (CANCER CENTER ONLY)
Basophils Absolute: 0 10*3/uL (ref 0.0–0.1)
Basophils Relative: 0 %
Eosinophils Absolute: 0 10*3/uL (ref 0.0–0.5)
Eosinophils Relative: 1 %
HCT: 41.6 % (ref 34.8–46.6)
Hemoglobin: 14.1 g/dL (ref 11.6–15.9)
Lymphocytes Relative: 8 %
Lymphs Abs: 0.4 10*3/uL — ABNORMAL LOW (ref 0.9–3.3)
MCH: 31.4 pg (ref 25.1–34.0)
MCHC: 33.8 g/dL (ref 31.5–36.0)
MCV: 92.7 fL (ref 79.5–101.0)
Monocytes Absolute: 0.3 10*3/uL (ref 0.1–0.9)
Monocytes Relative: 6 %
Neutro Abs: 4.5 10*3/uL (ref 1.5–6.5)
Neutrophils Relative %: 85 %
Platelet Count: 164 10*3/uL (ref 145–400)
RBC: 4.49 MIL/uL (ref 3.70–5.45)
RDW: 13.6 % (ref 11.2–14.5)
WBC Count: 5.3 10*3/uL (ref 3.9–10.3)

## 2018-03-02 LAB — CMP (CANCER CENTER ONLY)
ALT: 8 U/L (ref 0–44)
AST: 12 U/L — ABNORMAL LOW (ref 15–41)
Albumin: 3.9 g/dL (ref 3.5–5.0)
Alkaline Phosphatase: 72 U/L (ref 38–126)
Anion gap: 8 (ref 5–15)
BUN: 9 mg/dL (ref 8–23)
CO2: 29 mmol/L (ref 22–32)
Calcium: 9.9 mg/dL (ref 8.9–10.3)
Chloride: 106 mmol/L (ref 98–111)
Creatinine: 0.69 mg/dL (ref 0.44–1.00)
GFR, Est AFR Am: >60 mL/min (ref >60)
GFR, Estimated: >60 mL/min (ref >60)
Glucose, Bld: 99 mg/dL (ref 70–99)
Potassium: 4.5 mmol/L (ref 3.5–5.1)
Sodium: 143 mmol/L (ref 135–145)
Total Bilirubin: 0.4 mg/dL (ref 0.3–1.2)
Total Protein: 6.6 g/dL (ref 6.5–8.1)

## 2018-03-02 MED ORDER — IOHEXOL 300 MG/ML  SOLN
75.0000 mL | Freq: Once | INTRAMUSCULAR | Status: AC | PRN
  Administered 2018-03-02: 75 mL via INTRAVENOUS

## 2018-03-02 MED ORDER — SODIUM CHLORIDE 0.9 % IJ SOLN
INTRAMUSCULAR | Status: AC
  Filled 2018-03-02: qty 50

## 2018-03-04 ENCOUNTER — Telehealth: Payer: Self-pay

## 2018-03-04 ENCOUNTER — Inpatient Hospital Stay: Payer: Medicare Other | Attending: Internal Medicine | Admitting: Internal Medicine

## 2018-03-04 ENCOUNTER — Encounter: Payer: Self-pay | Admitting: Internal Medicine

## 2018-03-04 VITALS — BP 123/48 | HR 80 | Temp 98.7°F | Resp 16 | Ht 65.0 in | Wt 123.6 lb

## 2018-03-04 DIAGNOSIS — M545 Low back pain: Secondary | ICD-10-CM | POA: Diagnosis not present

## 2018-03-04 DIAGNOSIS — K5900 Constipation, unspecified: Secondary | ICD-10-CM

## 2018-03-04 DIAGNOSIS — K649 Unspecified hemorrhoids: Secondary | ICD-10-CM | POA: Diagnosis not present

## 2018-03-04 DIAGNOSIS — I10 Essential (primary) hypertension: Secondary | ICD-10-CM

## 2018-03-04 DIAGNOSIS — Z85118 Personal history of other malignant neoplasm of bronchus and lung: Secondary | ICD-10-CM | POA: Diagnosis not present

## 2018-03-04 DIAGNOSIS — K625 Hemorrhage of anus and rectum: Secondary | ICD-10-CM | POA: Diagnosis not present

## 2018-03-04 DIAGNOSIS — C3491 Malignant neoplasm of unspecified part of right bronchus or lung: Secondary | ICD-10-CM
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
## 2018-03-04 NOTE — Telephone Encounter (Signed)
Printed avs and calender of upcoming appointment.per 10/2 los

## 2018-03-04 NOTE — Telephone Encounter (Signed)
Printed avs and calender of upcoming appointment. Per 10/2 los

## 2018-03-06 ENCOUNTER — Encounter: Payer: Self-pay | Admitting: Internal Medicine

## 2018-03-09 DIAGNOSIS — H16223 Keratoconjunctivitis sicca, not specified as Sjogren's, bilateral: Secondary | ICD-10-CM | POA: Diagnosis not present

## 2018-03-09 DIAGNOSIS — H04123 Dry eye syndrome of bilateral lacrimal glands: Secondary | ICD-10-CM | POA: Diagnosis not present

## 2018-03-10 ENCOUNTER — Ambulatory Visit (INDEPENDENT_AMBULATORY_CARE_PROVIDER_SITE_OTHER): Payer: Medicare Other | Admitting: Pulmonary Disease

## 2018-03-10 ENCOUNTER — Encounter: Payer: Self-pay | Admitting: Pulmonary Disease

## 2018-03-10 VITALS — BP 114/78 | HR 83 | Ht 65.0 in | Wt 125.6 lb

## 2018-03-10 DIAGNOSIS — J449 Chronic obstructive pulmonary disease, unspecified: Secondary | ICD-10-CM

## 2018-03-10 DIAGNOSIS — Z23 Encounter for immunization: Secondary | ICD-10-CM | POA: Diagnosis not present

## 2018-03-10 DIAGNOSIS — J438 Other emphysema: Secondary | ICD-10-CM | POA: Diagnosis not present

## 2018-03-10 DIAGNOSIS — I208 Other forms of angina pectoris: Secondary | ICD-10-CM

## 2018-03-10 MED ORDER — FLUTICASONE FUROATE 100 MCG/ACT IN AEPB: 1.0000 | INHALATION_SPRAY | Freq: Every day | RESPIRATORY_TRACT | 5 refills | Status: DC

## 2018-03-10 NOTE — Patient Instructions (Addendum)
Start using arnuity one puff daily, and rinse mouth after each use Spiriva one puff daily Levalbuterol two puffs every 6 hours as needed for cough, wheeze, or chest congestion Schedule an appointment with your orthopedic doctor to assess your back pain Flu shot today Follow up in 4 months

## 2018-03-10 NOTE — Progress Notes (Signed)
[facility]  Chief Complaint  Patient presents with  . Follow-up    Pt has increase productive cough-white mucus, wheezing, SOB and chest tightness.     Constitutional: BP 114/78 (BP Location: Left Arm, Cuff Size: Normal)   Pulse 83   Ht 5\' 5"  (1.651 m)   Wt 125 lb 9.6 oz (57 kg)   SpO2 97%   BMI 20.90 kg/m   History of Present Illness: Michele [name] is a 62 y.o. female former smoker with COPD/emphysema.  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  She is still having cough and chest congestion with wheeze.  She isn't sure what happened with arnuity.  She is having more back pain and feels achy in her legs.    Not having sinus congestion, fever, hemoptysis, or leg swelling.  Comprehensive Respiratory Exam:  General - alert Eyes - pupils reactive ENT - no sinus tenderness, no stridor Cardiac - regular rate/rhythm, no murmur Chest - equal breath sounds b/l, no wheezing or rales Abdomen - soft, non tender Extremities - no cyanosis, clubbing, or edema Skin - no rashes Lymphatics - no lymphadenopathy Neuro - normal strength Psych - anxious  Assessment/Plan:  COPD with emphysema and chronic bronchitis. - she is willing to try arnuity again - continue spiriva and prn xopenex - flu shot today  Hx of small cell lung cancer. - f/u with Dr. Julien Nordmann  Low back pain. - advised her to f/u with orthopedics   Patient Instructions  Start using arnuity one puff daily, and rinse mouth after each use Spiriva one puff daily Levalbuterol two puffs every 6 hours as needed for cough, wheeze, or chest congestion Schedule an appointment with your orthopedic doctor to assess your back pain Flu shot today Follow up in 4 months   [name], MD [place] 03/10/2018, 12:30 PM Pager:  [phone]  Flow Sheet  Pulmonary tests: PFT 12/23/11 >> FEV1 2.13 (85%), FEV1% 59, TLC 5.98 (112%), DLCO 60% CT chest  08/24/16 >> CAD, mod/severe emphysema CT chest 08/29/17 >> severe emphysema Spirometry 11/19/17 >> FEV1 2.58 (100%), FEV1% 76 FeNO 11/19/17 >> 5 CT chest 03/02/18 >> severe centrilobular and paraseptal emphysema, 4 mm nodule RUL partially calcified (reviewed by me)  Cardiac tests Echo 08/30/17 >> EF 60 to 65%, grade 1 DD, mild MR  Past Medical History: She  has a past medical history of Anginal pain ([place]), ANXIETY, Arthritis, Asthma, Borderline hypertension, CAD S/P percutaneous coronary angioplasty (5&6/'13; 6/'14), Cataract, Chronic kidney disease, Collagen vascular disease ([place]), CONTACT DERMATITIS&OTHER ECZEMA DUE UNSPEC CAUSE, COPD, DEPRESSION, DERMATOFIBROMA, DYSLIPIDEMIA, Dysrhythmia, Emphysema of lung ([place]), Encounter for antineoplastic chemotherapy (03/12/2016), GERD, Hepatitis, Hiatal hernia, History of radiation therapy (10-12/'17, 1-2/'18), History ST elevation myocardial infarction (STEMI) of inferolateral wall (10/2011), Hypertension, INSOMNIA, KNEE PAIN, CHRONIC, LOW BACK PAIN, RESTLESS LEG SYNDROME, Seizures ([place]), Shortness of breath dyspnea, Small cell lung carcinoma ([place]) (02/26/2016), SPONDYLOSIS, CERVICAL, WITH RADICULOPATHY, Tobacco abuse, Tuberculosis, UTI (urinary tract infection), and VITAMIN D DEFICIENCY.  Past Surgical History: She  has a past surgical history that includes Leg wound repair / closure (3299); Coronary angioplasty with stent (10/10/11); Coronary angioplasty with stent (11/06/11); Tonsillectomy; Tubal ligation (1970's); Knee surgery; Coronary angioplasty with stent (11/19/2012); NM MYOVIEW LTD (October 2013; 12/2013); CPET (09/07/2012); Cardiac catheterization (03/02/2014); left heart catheterization with coronary angiogram (N/A, 10/10/2011); percutaneous coronary stent intervention (pci-s) (N/A, 11/06/2011); left heart catheterization with coronary angiogram (N/A, 11/19/2012); left heart catheterization with coronary angiogram (N/A,  02/19/2013); left heart catheterization with  coronary angiogram (N/A, 03/02/2014); Colonoscopy; Polypectomy; Video bronchoscopy with endobronchial ultrasound (N/A, 02/14/2016); Direct laryngoscopy (N/A, 02/14/2016); OTHER SURGICAL HISTORY; Breast biopsy (2000's); NM MYOVIEW LTD (02/2016); lipoma surgery (Left, 10/2016); transthoracic echocardiogram (May 2013; September 2015); and transthoracic echocardiogram (08/30/2017).  Family History: Her family history includes Asthma in her mother; Cancer in her maternal grandmother; Colon polyps in her mother; Diabetes in her mother; Emphysema in her mother; Heart attack in her maternal grandfather; Heart disease in her father and mother; Hyperlipidemia in her mother; Hypertension in her mother; Kidney cancer in her brother; Stomach cancer in her brother and brother; Stroke in her mother; Stroke (age of onset: 64) in her brother.  Social History: She  reports that she quit smoking about 7 months ago. Her smoking use included cigarettes. She has a 60.00 pack-year smoking history. She has never used smokeless tobacco. She reports that she does not drink alcohol or use drugs.  Medications: Allergies as of 03/10/2018      Reactions   Ciprofloxacin Other (See Comments)   Hypoglycemia    Aspirin Other (See Comments)   GI upset; patient is only able to take enteric coated Aspirin.     Crestor [rosuvastatin] Other (See Comments)   Muscle pain    Ibuprofen Other (See Comments)   GI upset    Wellbutrin [bupropion] Palpitations   Zoloft [sertraline Hcl] Other (See Comments)   Insomnia, fatigue    Albuterol Palpitations   Lipitor [atorvastatin] Other (See Comments)   Muscle pain    Sulfonamide Derivatives Itching, Rash      Medication List        Accurate as of 03/10/18 12:30 PM. Always use your most recent med list.          ALPRAZolam 1 MG tablet Commonly known as:  XANAX Take 2 mg by mouth 3 (three) times daily.   CALCIUM 600+D 600-400 MG-UNIT tablet Generic drug:  Calcium Carbonate-Vitamin  D Take 1 tablet by mouth daily.   clopidogrel 75 MG tablet Commonly known as:  PLAVIX TAKE 1 TABLET(75 MG) BY MOUTH DAILY   CoQ10 100 MG Caps Take 100 mg by mouth daily.   cycloSPORINE 0.05 % ophthalmic emulsion Commonly known as:  RESTASIS 1 drop as directed.   Fluticasone Furoate 100 MCG/ACT Aepb Inhale 1 puff into the lungs daily.   isosorbide mononitrate 30 MG 24 hr tablet Commonly known as:  IMDUR Take 30 mg by mouth as directed.   levalbuterol 45 MCG/ACT inhaler Commonly known as:  XOPENEX HFA Inhale 1-2 puffs into the lungs every 6 (six) hours as needed for wheezing.   nitroGLYCERIN 0.4 MG SL tablet Commonly known as:  NITROSTAT DISSOLVE 1 TABLET UNDER THE TONGUE EVERY 5 MINUTES AS NEEDED FOR CHEST PAIN   pantoprazole 40 MG tablet Commonly known as:  PROTONIX Take 40 mg by mouth daily.   polyethylene glycol packet Commonly known as:  MIRALAX / GLYCOLAX Take 17 g by mouth as directed.   pravastatin 20 MG tablet Commonly known as:  PRAVACHOL Take 1 tablet (20 mg total) by mouth daily.   tiotropium 18 MCG inhalation capsule Commonly known as:  SPIRIVA Place 18 mcg into inhaler and inhale daily.

## 2018-03-11 ENCOUNTER — Other Ambulatory Visit: Payer: Self-pay | Admitting: Radiation Therapy

## 2018-03-11 DIAGNOSIS — C7931 Secondary malignant neoplasm of brain: Secondary | ICD-10-CM

## 2018-03-11 DIAGNOSIS — C7949 Secondary malignant neoplasm of other parts of nervous system: Secondary | ICD-10-CM

## 2018-03-11 NOTE — Progress Notes (Signed)
Mri brain  

## 2018-03-16 ENCOUNTER — Other Ambulatory Visit: Payer: Self-pay

## 2018-03-16 ENCOUNTER — Encounter (HOSPITAL_COMMUNITY): Payer: Self-pay | Admitting: *Deleted

## 2018-03-16 ENCOUNTER — Emergency Department (HOSPITAL_COMMUNITY): Payer: Medicare Other

## 2018-03-16 ENCOUNTER — Emergency Department (HOSPITAL_COMMUNITY)
Admission: EM | Admit: 2018-03-16 | Discharge: 2018-03-17 | Disposition: A | Discharge status: Home / Self Care | Payer: Medicare Other | Attending: Emergency Medicine | Admitting: Emergency Medicine

## 2018-03-16 ENCOUNTER — Encounter: Payer: Self-pay | Admitting: Medical Oncology

## 2018-03-16 DIAGNOSIS — K625 Hemorrhage of anus and rectum: Secondary | ICD-10-CM | POA: Diagnosis present

## 2018-03-16 DIAGNOSIS — G8929 Other chronic pain: Secondary | ICD-10-CM | POA: Diagnosis not present

## 2018-03-16 DIAGNOSIS — R310 Gross hematuria: Secondary | ICD-10-CM | POA: Diagnosis not present

## 2018-03-16 DIAGNOSIS — K644 Residual hemorrhoidal skin tags: Secondary | ICD-10-CM

## 2018-03-16 DIAGNOSIS — Z79899 Other long term (current) drug therapy: Secondary | ICD-10-CM | POA: Insufficient documentation

## 2018-03-16 DIAGNOSIS — R110 Nausea: Secondary | ICD-10-CM | POA: Diagnosis not present

## 2018-03-16 DIAGNOSIS — R569 Unspecified convulsions: Secondary | ICD-10-CM | POA: Insufficient documentation

## 2018-03-16 DIAGNOSIS — M545 Low back pain: Secondary | ICD-10-CM | POA: Diagnosis not present

## 2018-03-16 DIAGNOSIS — J439 Emphysema, unspecified: Secondary | ICD-10-CM | POA: Diagnosis not present

## 2018-03-16 DIAGNOSIS — I2510 Atherosclerotic heart disease of native coronary artery without angina pectoris: Secondary | ICD-10-CM | POA: Insufficient documentation

## 2018-03-16 DIAGNOSIS — Z87891 Personal history of nicotine dependence: Secondary | ICD-10-CM | POA: Diagnosis not present

## 2018-03-16 DIAGNOSIS — R1033 Periumbilical pain: Secondary | ICD-10-CM | POA: Diagnosis not present

## 2018-03-16 DIAGNOSIS — N189 Chronic kidney disease, unspecified: Secondary | ICD-10-CM | POA: Diagnosis not present

## 2018-03-16 DIAGNOSIS — K648 Other hemorrhoids: Secondary | ICD-10-CM | POA: Diagnosis not present

## 2018-03-16 DIAGNOSIS — I129 Hypertensive chronic kidney disease with stage 1 through stage 4 chronic kidney disease, or unspecified chronic kidney disease: Secondary | ICD-10-CM | POA: Diagnosis not present

## 2018-03-16 DIAGNOSIS — I252 Old myocardial infarction: Secondary | ICD-10-CM | POA: Diagnosis not present

## 2018-03-16 DIAGNOSIS — R58 Hemorrhage, not elsewhere classified: Secondary | ICD-10-CM | POA: Diagnosis not present

## 2018-03-16 LAB — COMPREHENSIVE METABOLIC PANEL
ALT: 11 U/L (ref 0–44)
AST: 15 U/L (ref 15–41)
Albumin: 4.2 g/dL (ref 3.5–5.0)
Alkaline Phosphatase: 64 U/L (ref 38–126)
Anion gap: 9 (ref 5–15)
BUN: 12 mg/dL (ref 8–23)
CO2: 28 mmol/L (ref 22–32)
Calcium: 10.2 mg/dL (ref 8.9–10.3)
Chloride: 103 mmol/L (ref 98–111)
Creatinine, Ser: 0.56 mg/dL (ref 0.44–1.00)
GFR calc Af Amer: >60 mL/min (ref >60)
GFR calc non Af Amer: >60 mL/min (ref >60)
Glucose, Bld: 90 mg/dL (ref 70–99)
Potassium: 3.9 mmol/L (ref 3.5–5.1)
Sodium: 140 mmol/L (ref 135–145)
Total Bilirubin: 0.3 mg/dL (ref 0.3–1.2)
Total Protein: 6.9 g/dL (ref 6.5–8.1)

## 2018-03-16 LAB — PREGNANCY, URINE: Preg Test, Ur: NEGATIVE

## 2018-03-16 LAB — TYPE AND SCREEN
ABO/RH(D): B NEG
Antibody Screen: NEGATIVE

## 2018-03-16 LAB — CBC
HCT: 44.9 % (ref 36.0–46.0)
Hemoglobin: 14.5 g/dL (ref 12.0–15.0)
MCH: 30.6 pg (ref 26.0–34.0)
MCHC: 32.3 g/dL (ref 30.0–36.0)
MCV: 94.7 fL (ref 80.0–100.0)
Platelets: 159 10*3/uL (ref 150–400)
RBC: 4.74 MIL/uL (ref 3.87–5.11)
RDW: 12.8 % (ref 11.5–15.5)
WBC: 4.7 10*3/uL (ref 4.0–10.5)
nRBC: 0 % (ref 0.0–0.2)

## 2018-03-16 LAB — URINALYSIS, ROUTINE W REFLEX MICROSCOPIC
Bacteria, UA: NONE SEEN
Bilirubin Urine: NEGATIVE
Glucose, UA: NEGATIVE mg/dL
Ketones, ur: NEGATIVE mg/dL
Leukocytes, UA: NEGATIVE
Nitrite: NEGATIVE
Protein, ur: NEGATIVE mg/dL
Specific Gravity, Urine: 1.003 — ABNORMAL LOW (ref 1.005–1.030)
pH: 7 (ref 5.0–8.0)

## 2018-03-16 LAB — WET PREP, GENITAL
Clue Cells Wet Prep HPF POC: NONE SEEN
Sperm: NONE SEEN
Trich, Wet Prep: NONE SEEN
Yeast Wet Prep HPF POC: NONE SEEN

## 2018-03-16 IMAGING — CT CT ABD-PELV W/ CM
2 of 5 series · 15 of 46 positions shown, 17 images · IV contrast (iopamidol)
Comparison: CT of the abdomen and pelvis from 08/03/2015, and
pelvic ultrasound performed 10/10/2015

CLINICAL DATA: Acute onset of generalized abdominal pain. Initial
encounter.

EXAM:
CT ABDOMEN AND PELVIS WITH CONTRAST
TECHNIQUE: Multidetector CT imaging of the abdomen and pelvis was performed
using the standard protocol following bolus administration of
intravenous contrast.
CONTRAST:  100mL [id number] IOPAMIDOL ([id number]) INJECTION 61%

[Series 2: a/p w/ 5mm · axial · 0.83mm/px · z∈[+902,+1302]mm · 12 of 92 slices shown, 14 images]
[im 6/92  soft-tissue]
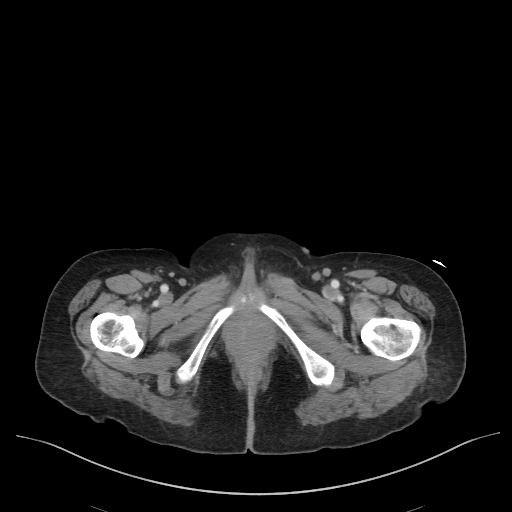
[im 6/92  bone]
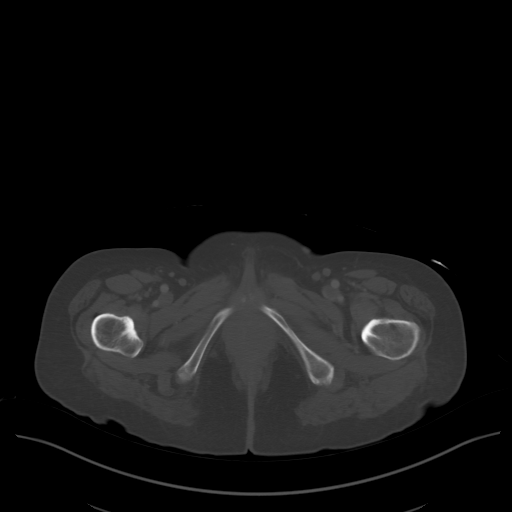
[im 12/92  soft-tissue]
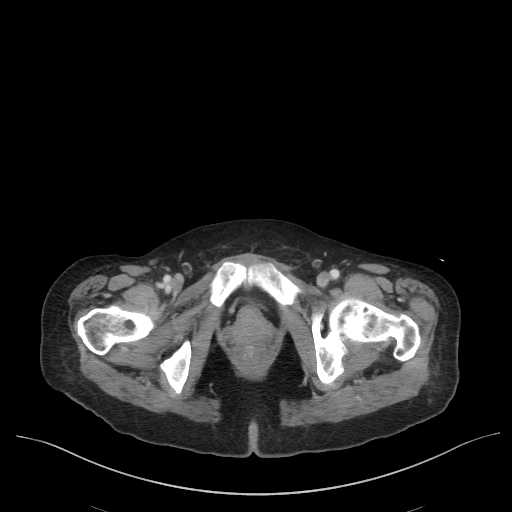
[im 23/92  soft-tissue]
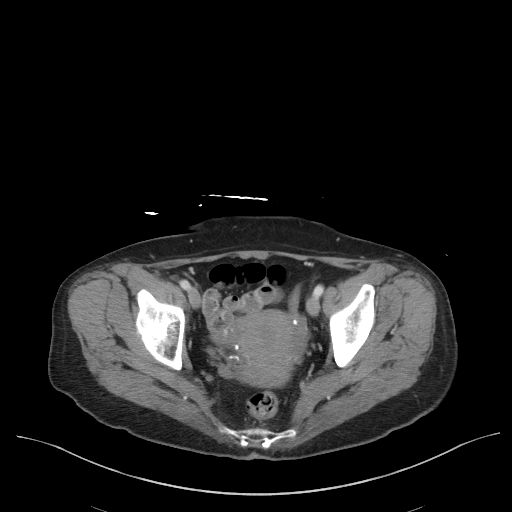
[im 29/92  soft-tissue]
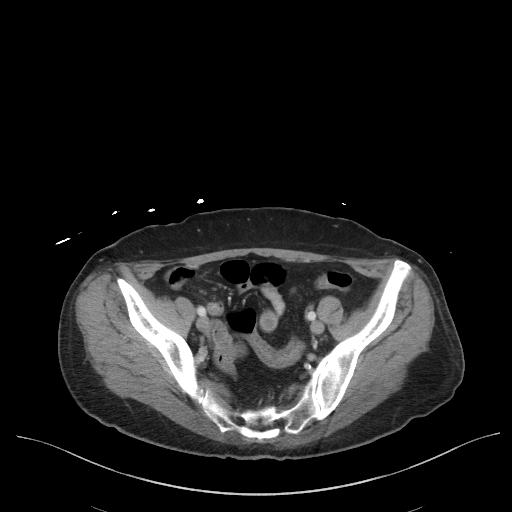
[im 35/92  soft-tissue]
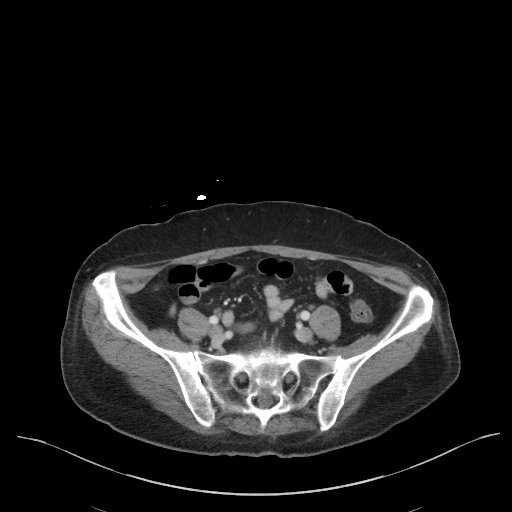
[im 40/92  soft-tissue]
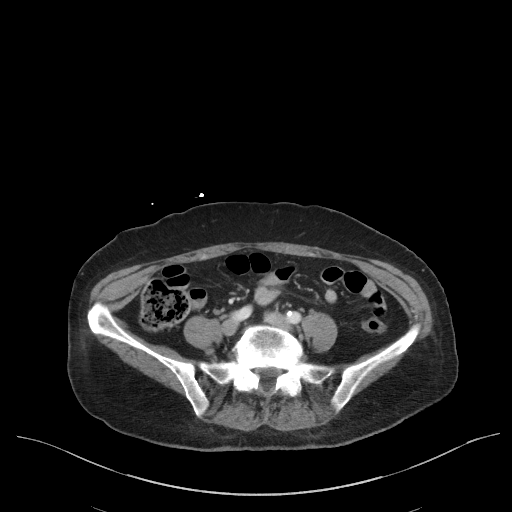
[im 52/92  soft-tissue]
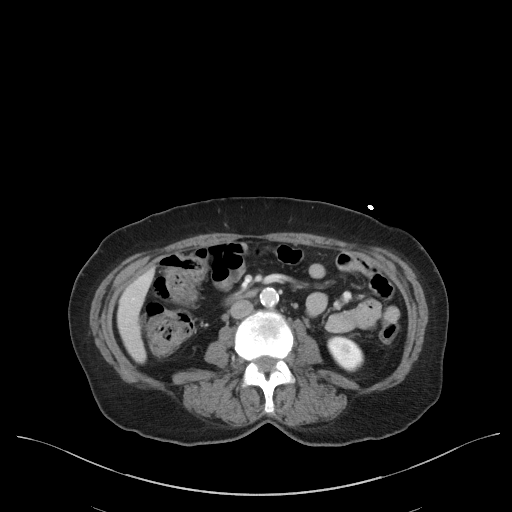
[im 57/92  soft-tissue]
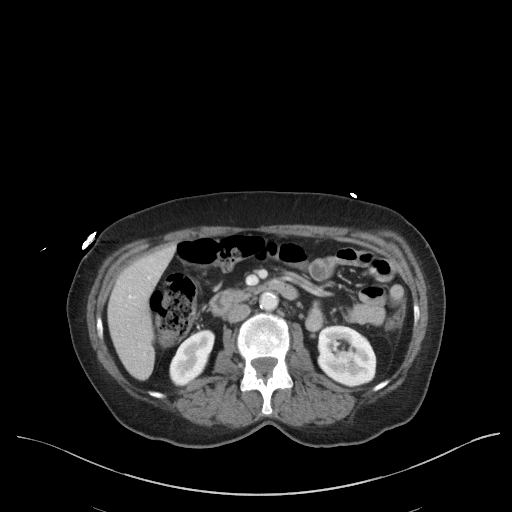
[im 63/92  soft-tissue]
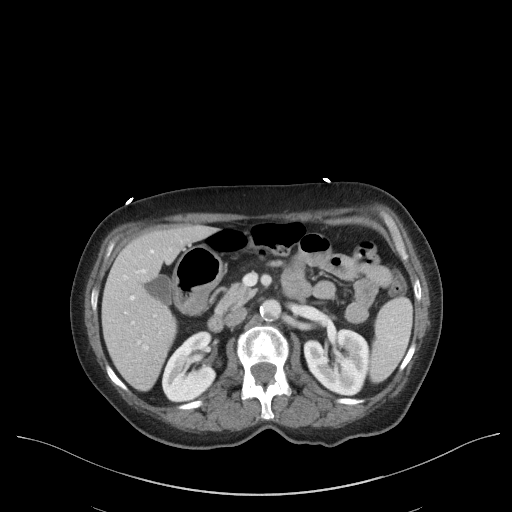
[im 63/92  bone]
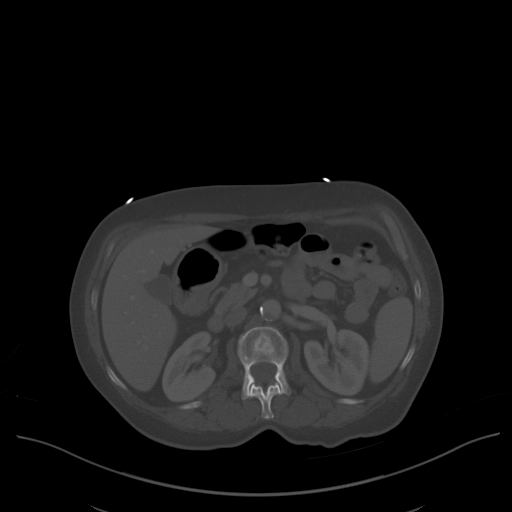
[im 69/92  soft-tissue]
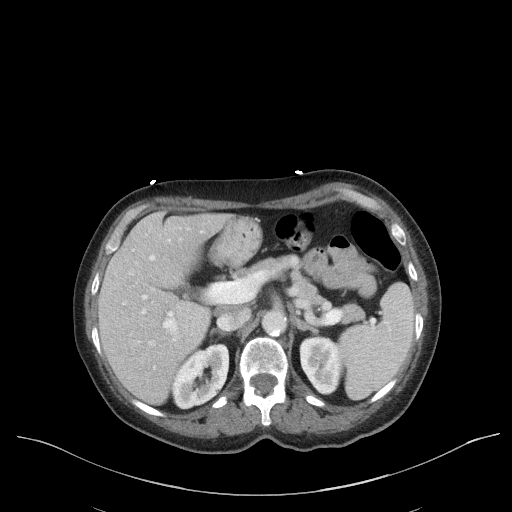
[im 80/92  soft-tissue]
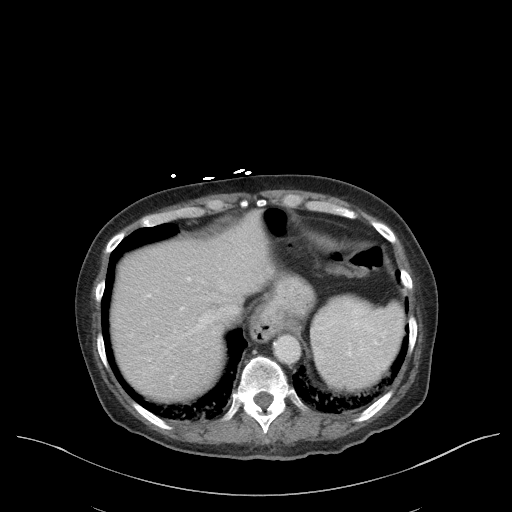
[im 86/92  soft-tissue]
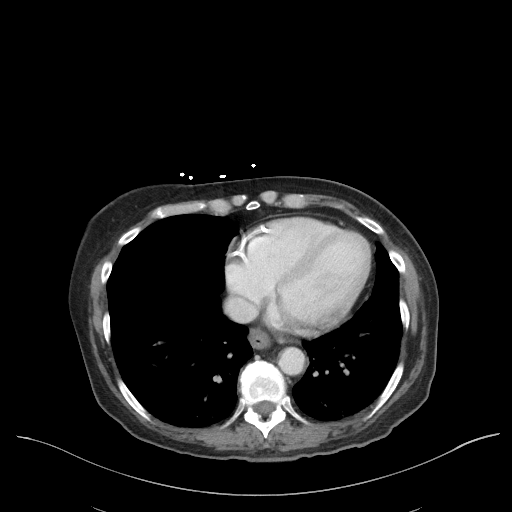

[Series 5: a/p w/ cor · coronal · 0.62mm/px · 3 of 113 slices shown]
[im 38/113  soft-tissue]
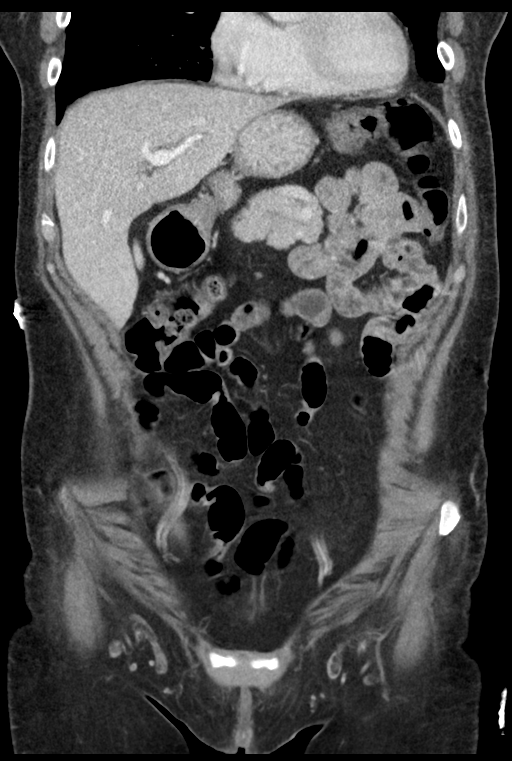
[im 50/113  soft-tissue]
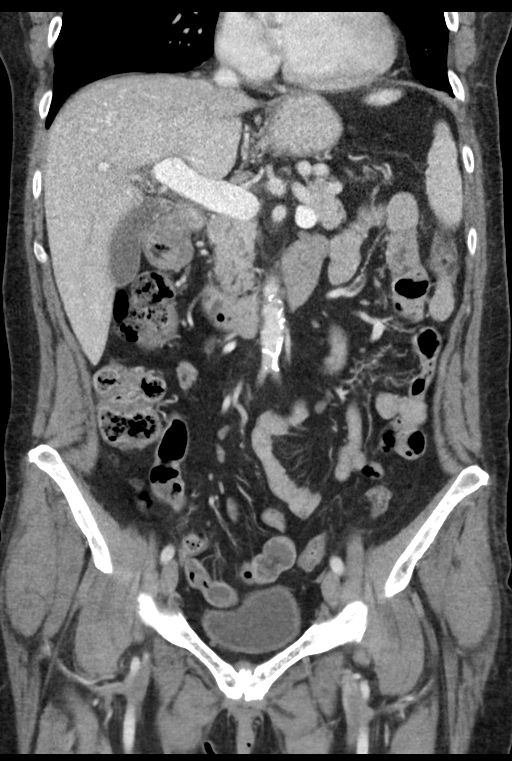
[im 63/113  soft-tissue]
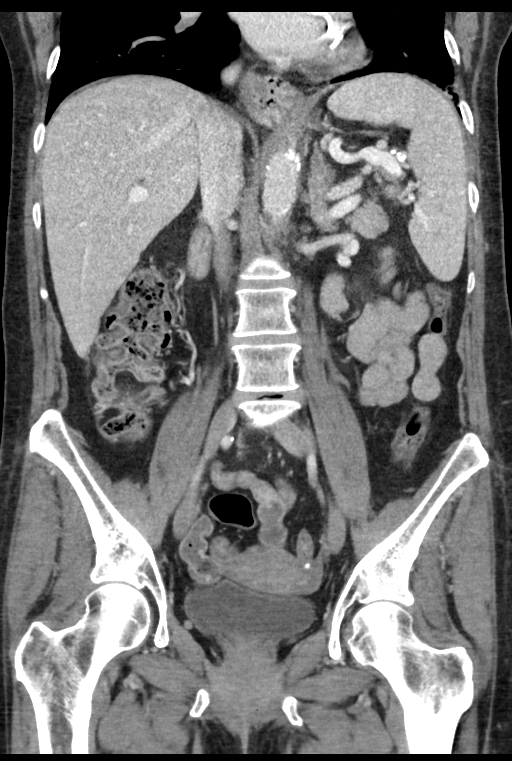

[15 of 46 positions shown; findings below may reference images not displayed]

FINDINGS: Bibasilar atelectasis is noted, with scattered emphysema. A small
hiatal hernia is noted. Diffuse coronary artery calcifications are
seen.

The liver is unremarkable in appearance. The spleen is mildly
enlarged, measuring 13.7 cm in length. The gallbladder is within
normal limits. The pancreas and adrenal glands are unremarkable.

The kidneys are unremarkable in appearance. There is no evidence of
hydronephrosis. No renal or ureteral stones are seen. No perinephric
stranding is appreciated.

No free fluid is identified. The small bowel is unremarkable in
appearance. The stomach is within normal limits. No acute vascular
abnormalities are seen. Mild calcification is noted along the
abdominal aorta and its branches, with mild associated mural
thrombus.

The appendix is normal in caliber, without evidence of appendicitis.

There is suggestion of wall thickening along the ascending,
descending and sigmoid colon, which may reflect some degree of
chronic inflammation or possibly a mild acute infectious or
inflammatory process.

The bladder is mildly distended and grossly unremarkable. The uterus
is unremarkable in appearance. The ovaries are relatively symmetric.
No suspicious adnexal masses are seen. No inguinal lymphadenopathy
is seen.

No acute osseous abnormalities are identified. Vacuum phenomenon is
noted along the lower lumbar spine.
IMPRESSION: 1. Suggestion of wall thickening along the ascending, descending and
sigmoid colon, which may reflect some degree of chronic inflammation
or possibly a mild acute infectious or inflammatory process.
2. Mild scattered calcification along the abdominal aorta and its
branches, with mild associated mural thrombus but no significant
luminal narrowing.
3. Bibasilar atelectasis, with underlying emphysema.
4. Small hiatal hernia.
5. Diffuse coronary artery calcifications seen.
6. Mild splenomegaly.

## 2018-03-16 MED ORDER — IOPAMIDOL (ISOVUE-300) INJECTION 61%
100.0000 mL | Freq: Once | INTRAVENOUS | Status: AC | PRN
  Administered 2018-03-16: 100 mL via INTRAVENOUS

## 2018-03-16 MED ORDER — SODIUM CHLORIDE 0.9 % IJ SOLN
INTRAMUSCULAR | Status: AC
  Filled 2018-03-16: qty 50

## 2018-03-16 MED ORDER — MORPHINE SULFATE (PF) 4 MG/ML IV SOLN
4.0000 mg | Freq: Once | INTRAVENOUS | Status: AC
  Administered 2018-03-16: 4 mg via INTRAVENOUS
  Filled 2018-03-16: qty 1

## 2018-03-16 MED ORDER — LIDOCAINE-HYDROCORTISONE ACE 3-0.5 % RE CREA: 1.0000 | TOPICAL_CREAM | Freq: Two times a day (BID) | RECTAL | 0 refills | Status: DC

## 2018-03-16 MED ORDER — IOPAMIDOL (ISOVUE-300) INJECTION 61%
INTRAVENOUS | Status: AC
  Filled 2018-03-16: qty 100

## 2018-03-16 MED ORDER — GI COCKTAIL ~~LOC~~
30.0000 mL | Freq: Once | ORAL | Status: AC
  Administered 2018-03-16: 30 mL via ORAL
  Filled 2018-03-16: qty 30

## 2018-03-16 MED ORDER — SUCRALFATE 1 GM/10ML PO SUSP: 1.0000 g | Freq: Three times a day (TID) | ORAL | 0 refills | Status: DC

## 2018-03-16 MED ORDER — FAMOTIDINE IN NACL 20-0.9 MG/50ML-% IV SOLN
20.0000 mg | Freq: Once | INTRAVENOUS | Status: AC
  Administered 2018-03-16: 20 mg via INTRAVENOUS
  Filled 2018-03-16: qty 50
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
## 2018-03-16 NOTE — ED Notes (Signed)
Patient transported to CT 

## 2018-03-16 NOTE — Discharge Instructions (Addendum)
You were seen in the ER for abdominal pain, rectal bleeding and back pain.  Labs and CT looked ok today.   You have at least 3 external hemorrhoids and one is friable and bleeding with light palpation.  This is probably the source of your bleeding.  Treatment of hemorrhoids includes MiraLAX, stool softener, oral hydration.  I have given you a cream to apply to anal area.  Ultimately you may need surgical removal.  Follow-up with [facility] for further discussion of this.  Continue taking your antiacid medication (pantaprazole).  Carafate solution right before meals.  Avoid irritating foods, alcohol, ibuprofen.  Follow-up with your gastroenterologist if your abdominal pain persists.  Talk to your gastroenterologist about your blood thinner and recent rectal bleeding   Return to the ER for fever, vomiting, worsening pain, large amounts of bleeding or black stools.

## 2018-03-16 NOTE — ED Notes (Signed)
Bed: YS06 Expected date:  Expected time:  Means of arrival:  Comments: EMS 58F vag bleeding - Rm19

## 2018-03-16 NOTE — ED Triage Notes (Signed)
Pt reports chronic lower abd pain with back pain, became severe a few weeks ago. Today, she reports a large amount of blood when urinating and noticed blood on the toilet paper when she wiped her bottom.  She reports blood clots.  She also endorses dizziness and weakness for "weeks" and body aches.  She is A&O x .  She has not followed-up with a doctor re abd and back pain and the weakness.

## 2018-03-16 NOTE — ED Triage Notes (Signed)
Per EMS, pt home, reports rectal and vaginal bleeding x 2-3 days with lower abd pain and nausea.  She is A&Ox 4.  Hx of Lung Ca

## 2018-03-17 ENCOUNTER — Telehealth: Payer: Self-pay | Admitting: Physician Assistant

## 2018-03-17 ENCOUNTER — Other Ambulatory Visit: Payer: Self-pay

## 2018-03-17 LAB — GC/CHLAMYDIA PROBE AMP (~~LOC~~) NOT AT ARMC
Chlamydia: NEGATIVE
Neisseria Gonorrhea: NEGATIVE

## 2018-03-17 NOTE — Telephone Encounter (Signed)
Cannot hear me. Asks me to call back.

## 2018-03-18 ENCOUNTER — Telehealth: Payer: Self-pay | Admitting: Pulmonary Disease

## 2018-03-18 LAB — URINE CULTURE

## 2018-03-18 NOTE — Telephone Encounter (Signed)
Offered an appointment today. Patient declines. She will keep her appointment tomorrow.

## 2018-03-18 NOTE — Telephone Encounter (Signed)
Spoke with patient-aware that VS is back in the office on Monday 03/23/18 to review CXR with findings list below: FINDINGS: Hyperinflation with severe emphysematous disease. No focal opacity or pleural effusion. Stable cardiomediastinal silhouette with aortic atherosclerosis. No pneumothorax. A small nodular opacity at the left apex is suspected to represent a summation shadow.  Pt would like to know answers about this sooner; she is worried. Eustaquio Maize will you please advise as VS is out of the office. Thanks.

## 2018-03-18 NOTE — Telephone Encounter (Signed)
Discussed recent CXR results with patient. She had a Chest CT with contrast on 03/02/18 that showed severe emphysema, no acute findings or  evidence of metastatic disease. Advised patient that if she had further concerns to make an apt with Dr. Halford Chessman to discuss further in person.

## 2018-03-19 ENCOUNTER — Encounter: Payer: Self-pay | Admitting: Nurse Practitioner

## 2018-03-19 ENCOUNTER — Ambulatory Visit (INDEPENDENT_AMBULATORY_CARE_PROVIDER_SITE_OTHER): Payer: Medicare Other | Admitting: Nurse Practitioner

## 2018-03-19 VITALS — BP 132/80 | HR 100 | Ht 66.14 in | Wt 125.0 lb

## 2018-03-19 DIAGNOSIS — K5900 Constipation, unspecified: Secondary | ICD-10-CM

## 2018-03-19 DIAGNOSIS — K625 Hemorrhage of anus and rectum: Secondary | ICD-10-CM

## 2018-03-19 DIAGNOSIS — I208 Other forms of angina pectoris: Secondary | ICD-10-CM | POA: Diagnosis not present

## 2018-03-19 DIAGNOSIS — K649 Unspecified hemorrhoids: Secondary | ICD-10-CM

## 2018-03-19 MED ORDER — LINACLOTIDE 145 MCG PO CAPS: 145.0000 ug | ORAL_CAPSULE | Freq: Every day | ORAL | 2 refills | Status: DC

## 2018-03-19 NOTE — Patient Instructions (Addendum)
If you are age 64 or older, your body mass index should be between 23-30. Your Body mass index is 20.09 kg/m. If this is out of the aforementioned range listed, please consider follow up with your Primary Care Provider.  If you are age 69 or younger, your body mass index should be between 19-25. Your Body mass index is 20.09 kg/m. If this is out of the aformentioned range listed, please consider follow up with your Primary Care Provider.   We have sent the following medications to your pharmacy for you to pick up at your convenience: [place]  - Call us if insurance will not cover this medication.  Start Fiber daily.  Follow up we me on April 02, 2018 at 11:00 a.m.  Thank you for choosing me and [facility].   Tye Savoy, NP
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
## 2018-03-19 NOTE — Progress Notes (Signed)
I agree with the above note, plan 

## 2018-03-26 ENCOUNTER — Other Ambulatory Visit: Payer: Self-pay | Admitting: Radiation Therapy

## 2018-03-26 NOTE — Progress Notes (Signed)
Michele [name] presents for follow up of radiation completed:  03/19/16 - 05/06/16 : Mediastinum treated to 66 Gy in 33 fractions at 2 Gy per fraction 06/25/16-  07/08/16 : Whole Brain treated to 25 Gy in 10 fractions of 2.5 Gy per fraction  She saw Dr. Julien Nordmann last on 03/04/18 and will see him next on 09/03/18 after a CT chest. She had a MRI of her brain on 03/30/18 and is here for the results. She reports daily headaches. She will occasionally take tylenol. She occasionally feels faint and has passed out possibly due to a low blood pressure. She does forget things like why she went into a room, or a recent conversations she has recently had.   BP 108/61 (BP Location: Left Arm, Patient Position: Sitting)   Pulse 93   Temp 98 F (36.7 C)   Resp 20   Ht 5\' 6"  (1.676 m)   Wt 124 lb 9.6 oz (56.5 kg)   SpO2 99%   BMI 20.11 kg/m

## 2018-03-30 ENCOUNTER — Ambulatory Visit
Admission: RE | Admit: 2018-03-30 | Discharge: 2018-03-30 | Disposition: A | Discharge status: Home / Self Care | Payer: Medicare Other | Source: Ambulatory Visit | Attending: Radiation Oncology | Admitting: Radiation Oncology

## 2018-03-30 DIAGNOSIS — C7949 Secondary malignant neoplasm of other parts of nervous system: Secondary | ICD-10-CM

## 2018-03-30 DIAGNOSIS — C7931 Secondary malignant neoplasm of brain: Secondary | ICD-10-CM

## 2018-03-30 DIAGNOSIS — R51 Headache: Secondary | ICD-10-CM | POA: Diagnosis not present

## 2018-03-30 MED ORDER — GADOBENATE DIMEGLUMINE 529 MG/ML IV SOLN
10.0000 mL | Freq: Once | INTRAVENOUS | Status: AC | PRN
  Administered 2018-03-30: 10 mL via INTRAVENOUS

## 2018-03-31 DIAGNOSIS — J449 Chronic obstructive pulmonary disease, unspecified: Secondary | ICD-10-CM | POA: Diagnosis not present

## 2018-03-31 DIAGNOSIS — R05 Cough: Secondary | ICD-10-CM | POA: Diagnosis not present

## 2018-04-01 ENCOUNTER — Encounter: Payer: Self-pay | Admitting: Radiation Oncology

## 2018-04-01 ENCOUNTER — Ambulatory Visit
Admission: RE | Admit: 2018-04-01 | Discharge: 2018-04-01 | Disposition: A | Discharge status: Home / Self Care | Payer: Medicare Other | Source: Ambulatory Visit | Attending: Radiation Oncology | Admitting: Radiation Oncology

## 2018-04-01 ENCOUNTER — Other Ambulatory Visit: Payer: Self-pay

## 2018-04-01 ENCOUNTER — Inpatient Hospital Stay: Payer: Medicare Other

## 2018-04-01 VITALS — BP 108/61 | HR 93 | Temp 98.0°F | Resp 20 | Ht 66.0 in | Wt 124.6 lb

## 2018-04-01 DIAGNOSIS — Z85118 Personal history of other malignant neoplasm of bronchus and lung: Secondary | ICD-10-CM | POA: Diagnosis not present

## 2018-04-01 DIAGNOSIS — Z08 Encounter for follow-up examination after completed treatment for malignant neoplasm: Secondary | ICD-10-CM | POA: Diagnosis not present

## 2018-04-01 DIAGNOSIS — C771 Secondary and unspecified malignant neoplasm of intrathoracic lymph nodes: Secondary | ICD-10-CM

## 2018-04-01 DIAGNOSIS — Z8579 Personal history of other malignant neoplasms of lymphoid, hematopoietic and related tissues: Secondary | ICD-10-CM | POA: Diagnosis not present

## 2018-04-02 ENCOUNTER — Ambulatory Visit: Payer: Self-pay | Admitting: Nurse Practitioner

## 2018-04-03 NOTE — Progress Notes (Signed)
Radiation Oncology         [phone] ________________________________  Name: Michele [name] MRN: [id number]  Date: 04/01/2018  DOB: March 02, 1956  Follow-Up Visit Note  Outpatient  CC: [name], MD  Curt Bears, MD  Diagnosis and Prior Radiotherapy:    ICD-10-CM   1. 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   Narrative:  The patient returns today for routine follow-up of radiation completed 07/08/16.   She met with Dr. Mickeal Skinner in neurology on 06/12/17 for headaches. Not thought to be migrainous.  There is no structural CNS deficit, neurologic exam is normal. Encouraged to make lifestyle changes.  MRI brain performed on 09/18/17 showed continued satisfactory post treatment appearance of the brain. No metastatic disease or acute intracranial abnormality was appreciated.    Ct angiogram of chest on 08/29/17 for SOB and syncope.This showed no definite PE. It did show severe emphysema in the upper lobes.No enlarged lymph nodes. No acute pulmonary disease.  MRI 03-30-18 of brain showed no changes, no sign of metastatic disease.  I personally reviewed her imaging.  On review of systems, the patient reports  aily headaches. She will occasionally take tylenol. She occasionally feels faint and has passed out possibly due to a low blood pressure. She does forget things like why she went into a room, or a recent conversations she has recently had.  She has a cold, currently.   ALLERGIES:  is allergic to ciprofloxacin; aspirin; crestor [rosuvastatin]; ibuprofen; wellbutrin [bupropion]; zoloft [sertraline  hcl]; albuterol; lipitor [atorvastatin]; and sulfonamide derivatives.  Meds: Current Outpatient Medications  Medication Sig Dispense Refill  . ALPRAZolam (XANAX) 1 MG tablet Take 1 mg by mouth 3 (three) times daily.     . Calcium Carbonate-Vitamin D (CALCIUM 600+D) 600-400 MG-UNIT tablet Take 1 tablet by mouth daily.    . clopidogrel (PLAVIX) 75 MG tablet TAKE 1 TABLET(75 MG) BY MOUTH DAILY (Patient taking differently: Take 75 mg by mouth daily. ) 90 tablet 1  . Coenzyme Q10 (COQ10) 100 MG CAPS Take 100 mg by mouth daily.     . Fluticasone Furoate (ARNUITY ELLIPTA) 100 MCG/ACT AEPB Inhale 1 puff into the lungs daily. 30 each 5  . isosorbide mononitrate (IMDUR) 30 MG 24 hr tablet Take 30 mg by mouth as directed.    . levalbuterol (XOPENEX HFA) 45 MCG/ACT inhaler Inhale 1-2 puffs into the lungs every 6 (six) hours as needed for wheezing. 1 Inhaler 6  . nitroGLYCERIN (NITROSTAT) 0.4 MG SL tablet DISSOLVE 1 TABLET UNDER THE TONGUE EVERY 5 MINUTES AS NEEDED FOR CHEST PAIN (Patient taking differently: Place 0.4 mg under the tongue every 5 (five) minutes as needed for chest pain. ) 25 tablet 4  . pantoprazole (PROTONIX) 40 MG tablet Take 40 mg by mouth daily.    . pravastatin (PRAVACHOL) 20 MG tablet Take 1 tablet (20 mg total) by mouth daily. 90 tablet 1  . tiotropium (SPIRIVA) 18 MCG inhalation capsule Place 18 mcg into inhaler and inhale daily.    [name] amoxicillin-clavulanate (AUGMENTIN) 875-125 MG tablet Take 1 tablet by mouth 2 (two) times daily for  7 days. 14 tablet 0  . azithromycin (ZITHROMAX) 250 MG tablet Take 1 tablet (250 mg total) by mouth as directed. (Patient not taking: Reported on 04/07/2018) 6 tablet 0  . linaclotide (LINZESS) 145 MCG CAPS capsule Take 1 capsule (145 mcg total) by mouth daily before breakfast. 30 capsule 2  . senna (SENOKOT) 8.6 MG tablet Take 1 tablet by mouth daily.     Current Facility-Administered Medications  Medication Dose Route Frequency Provider Last Rate Last  Dose  . levalbuterol (XOPENEX) nebulizer solution 0.63 mg  0.63 mg Nebulization Once Parrett, Tammy S, NP       Facility-Administered Medications Ordered in Other Encounters  Medication Dose Route Frequency Provider Last Rate Last Dose  . HYDROcodone-acetaminophen (NORCO/VICODIN) 5-325 MG per tablet 1 tablet  1 tablet Oral Once Susanne Borders, NP        Physical Findings:    height is 5' 6"  (1.676 m) and weight is 124 lb 9.6 oz (56.5 kg). Her temperature is 98 F (36.7 C). Her blood pressure is 108/61 and her pulse is 93. Her respiration is 20 and oxygen saturation is 99%.  General: Alert and oriented, in no acute distress Heart: Regular in rate and rhythm with no murmurs, rubs, or gallops. Chest: scattered wheezes Neurologic:  No obvious focalities. Speech is fluent. Coordination is intact. Strength is symmetric.  Psychiatric: Judgment and insight are intact.     Lab Findings: Lab Results  Component Value Date   WBC 4.7 03/16/2018   HGB 14.5 03/16/2018   HCT 44.9 03/16/2018   MCV 94.7 03/16/2018   PLT 159 03/16/2018    Lab Results  Component Value Date   TSH 1.57 05/02/2017    CMP     Component Value Date/Time   NA 140 03/16/2018 1805   NA 143 02/26/2017 0944   K 3.9 03/16/2018 1805   K 3.8 02/26/2017 0944   CL 103 03/16/2018 1805   CO2 28 03/16/2018 1805   CO2 27 02/26/2017 0944   GLUCOSE 90 03/16/2018 1805   GLUCOSE 97 02/26/2017 0944   BUN 12 03/16/2018 1805   BUN 15.0 02/26/2017 0944   CREATININE 0.56 03/16/2018 1805   CREATININE 0.69 03/02/2018 0944   CREATININE 0.7 02/26/2017 0944   CALCIUM 10.2 03/16/2018 1805   CALCIUM 9.4 02/26/2017 0944   PROT 6.9 03/16/2018 1805   PROT 5.9 (L) 12/22/2017 0852   PROT 6.1 (L) 02/26/2017 0944   ALBUMIN 4.2 03/16/2018 1805   ALBUMIN 4.1 12/22/2017 0852   ALBUMIN 3.7 02/26/2017 0944   AST 15 03/16/2018 1805   AST 12 (L) 03/02/2018 0944   AST 14 02/26/2017 0944   ALT 11 03/16/2018 1805   ALT 8 03/02/2018 0944    ALT 8 02/26/2017 0944   ALKPHOS 64 03/16/2018 1805   ALKPHOS 67 02/26/2017 0944   BILITOT 0.3 03/16/2018 1805   BILITOT 0.4 03/02/2018 0944   BILITOT 0.39 02/26/2017 0944   GFRNONAA >60 03/16/2018 1805   GFRNONAA >60 03/02/2018 0944   GFRAA >60 03/16/2018 1805   GFRAA >60 03/02/2018 0944    Radiographic Findings: Dg Chest 2 View  Result Date: 03/16/2018 CLINICAL DATA:  Weakness EXAM: CHEST - 2 VIEW COMPARISON:  CT chest 03/02/2018, radiograph 11/19/2017 FINDINGS: Hyperinflation with severe emphysematous disease. No focal opacity or pleural effusion. Stable cardiomediastinal silhouette with aortic atherosclerosis. No pneumothorax. A small nodular opacity at the left apex is suspected to represent a summation shadow. IMPRESSION: Hyperinflation with emphysematous disease. No acute opacity  since prior exam. Electronically Signed   By: Donavan Foil M.D.   On: 03/16/2018 20:49   Mr Jeri Cos JS Contrast  Result Date: 03/30/2018 CLINICAL DATA:  Headaches, imbalance, and weakness. Syncopal episodes 1 month ago. History of small cell lung cancer status post whole brain radiation therapy 06/2016-07/2016. EXAM: MRI HEAD WITHOUT AND WITH CONTRAST TECHNIQUE: Multiplanar, multiecho pulse sequences of the brain and surrounding structures were obtained without and with intravenous contrast. CONTRAST:  41m MULTIHANCE GADOBENATE DIMEGLUMINE 529 MG/ML IV SOLN COMPARISON:  09/18/2017 FINDINGS: Brain: There is no evidence of acute infarct, intracranial hemorrhage, mass, midline shift, or extra-axial fluid collection. The ventricles and sulci are within normal limits for age. Scattered cerebral white matter T2 hyperintensities are unchanged, mild for age, and nonspecific though may reflect a combination of postradiation sequelae and chronic small vessel ischemia. 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 No abnormal enhancement is identified. Vascular: Major intracranial vascular flow voids are preserved. Skull and upper  cervical spine: No suspicious marrow lesion. Sinuses/Orbits: Unremarkable orbits. Trace left mastoid effusion. Clear paranasal sinuses. Other: None. IMPRESSION: Unchanged appearance of the brain without evidence of metastatic disease or acute abnormality. Electronically Signed   By: ALogan BoresM.D.   On: 03/30/2018 11:12   Ct Abdomen Pelvis W Contrast  Result Date: 03/16/2018 CLINICAL DATA:  Chronic lower abdominal pain and back pain with gross hematuria. Also bleeding per rectum. Dizziness and weakness. EXAM: CT ABDOMEN AND PELVIS WITH CONTRAST TECHNIQUE: Multidetector CT imaging of the abdomen and pelvis was performed using the standard protocol following bolus administration of intravenous contrast. CONTRAST:  100 cc Isovue-300 COMPARISON:  05/06/2017 FINDINGS: Lower chest: Widespread emphysema. No active process. No focal lesion. Hepatobiliary: Normal Pancreas: Normal Spleen: Normal parenchyma. Small calcified splenic artery aneurysm, not significant. Adrenals/Urinary Tract: Adrenal glands are normal. Kidneys are normal except for a 1.3 cm cyst in the upper pole on the right. No bladder abnormality is seen. Stomach/Bowel: No visible bowel pathology. No evidence of mass, obstruction or inflammatory disease. Vascular/Lymphatic: Aortic atherosclerosis. No aneurysm. IVC is normal. No retroperitoneal adenopathy. Reproductive: No pelvic mass. Other: No free fluid or air. Musculoskeletal: Ordinary lumbar degenerative changes. IMPRESSION: No abnormality seen to explain the clinical presentation. No evidence of urinary tract pathology. No evidence of bowel pathology. Aortic atherosclerosis, advanced. Emphysema. Electronically Signed   By: MNelson ChimesM.D.   On: 03/16/2018 20:44    Impression/Plan: 62year old woman with limited stage IIIA T0N2M0 small cell lung cancer with secondary malignancy of mediastinal lymph nodes. No evidence of recurrence. We will repeat MRI of her brain in 6 months. F/u w/ me at that  time.  Again, we discussed nutritional needs, advancing diet and exercise to improve strength and energy. _____________________________________   SEppie Gibson MD

## 2018-04-06 ENCOUNTER — Telehealth: Payer: Self-pay | Admitting: Pulmonary Disease

## 2018-04-06 DIAGNOSIS — R05 Cough: Secondary | ICD-10-CM

## 2018-04-06 DIAGNOSIS — R059 Cough, unspecified: Secondary | ICD-10-CM

## 2018-04-06 MED ORDER — AZITHROMYCIN 250 MG PO TABS: 250.0000 mg | ORAL_TABLET | ORAL | 0 refills | Status: DC

## 2018-04-06 NOTE — Telephone Encounter (Signed)
Michele [name] please advise on message below, per [facility] Dr. Halford Chessman is in [place] and is not available. Thank you.

## 2018-04-06 NOTE — Telephone Encounter (Signed)
Called and spoke with patient she stated that she has had a productive cough with thick yellow mucus for one week. She is also having a headache, sore throat, SOB, congestion. No fever. No sneezing. Patients son just got over bronchitis. Patient is wanting something called in and a chest xray.   VS please advise, thank you.

## 2018-04-06 NOTE — Telephone Encounter (Signed)
Thank you   Michele [name]  

## 2018-04-06 NOTE — Telephone Encounter (Signed)
Okay to offer:  Azithromycin 250mg  tablet  >>>Take 2 tablets (500mg  total) today, and then 1 tablet (250mg ) for the next four days  >>>take with food  >>>can also take probiotic and / or yogurt while on antibiotic   Prednisone 10mg  tablet  >>>Take 2 tablets (20 mg total) daily for the next 5 days >>> Take with food in the morning  Chest Xray  Please place the orders.   Ensure patient is following these therapies from last OV:   Start using arnuity one puff daily, and rinse mouth after each use Spiriva one puff daily Levalbuterol two puffs every 6 hours as needed for cough, wheeze, or chest congestion  Patient needs follow-up office visit in 2 to 4 weeks to ensure things are resolving-symptoms are improving.  Wyn Quaker FNP

## 2018-04-06 NOTE — Telephone Encounter (Signed)
Called and spoke with patient she is aware and verbalized understanding. Patient does not want to take prednisone due to palpitations but will take zpak. Medications sent in. Patient will be here tomorrow for cxr and will make appointment then. Nothing further needed.

## 2018-04-07 ENCOUNTER — Telehealth: Payer: Self-pay | Admitting: Adult Health

## 2018-04-07 ENCOUNTER — Ambulatory Visit (INDEPENDENT_AMBULATORY_CARE_PROVIDER_SITE_OTHER): Payer: Medicare Other | Admitting: Adult Health

## 2018-04-07 ENCOUNTER — Ambulatory Visit (INDEPENDENT_AMBULATORY_CARE_PROVIDER_SITE_OTHER)
Admission: RE | Admit: 2018-04-07 | Discharge: 2018-04-07 | Disposition: A | Discharge status: Home / Self Care | Payer: Medicare Other | Source: Ambulatory Visit | Attending: Pulmonary Disease | Admitting: Pulmonary Disease

## 2018-04-07 ENCOUNTER — Encounter: Payer: Self-pay | Admitting: Radiation Oncology

## 2018-04-07 ENCOUNTER — Encounter: Payer: Self-pay | Admitting: Adult Health

## 2018-04-07 ENCOUNTER — Other Ambulatory Visit (INDEPENDENT_AMBULATORY_CARE_PROVIDER_SITE_OTHER): Payer: Medicare Other

## 2018-04-07 VITALS — BP 98/58 | HR 91 | Temp 98.1°F | Ht 66.0 in | Wt 123.6 lb

## 2018-04-07 DIAGNOSIS — J029 Acute pharyngitis, unspecified: Secondary | ICD-10-CM

## 2018-04-07 DIAGNOSIS — J449 Chronic obstructive pulmonary disease, unspecified: Secondary | ICD-10-CM

## 2018-04-07 DIAGNOSIS — R0602 Shortness of breath: Secondary | ICD-10-CM | POA: Diagnosis not present

## 2018-04-07 DIAGNOSIS — I208 Other forms of angina pectoris: Secondary | ICD-10-CM | POA: Diagnosis not present

## 2018-04-07 DIAGNOSIS — R05 Cough: Secondary | ICD-10-CM

## 2018-04-07 DIAGNOSIS — R079 Chest pain, unspecified: Secondary | ICD-10-CM | POA: Diagnosis not present

## 2018-04-07 DIAGNOSIS — R059 Cough, unspecified: Secondary | ICD-10-CM

## 2018-04-07 DIAGNOSIS — J439 Emphysema, unspecified: Secondary | ICD-10-CM | POA: Diagnosis not present

## 2018-04-07 LAB — BETA STREP SCREEN: Streptococcus, Group A Screen (Direct): NEGATIVE

## 2018-04-07 MED ORDER — LEVALBUTEROL HCL 0.63 MG/3ML IN NEBU: 0.6300 mg | INHALATION_SOLUTION | RESPIRATORY_TRACT | 5 refills | Status: DC | PRN

## 2018-04-07 MED ORDER — LEVALBUTEROL HCL 0.63 MG/3ML IN NEBU
0.6300 mg | INHALATION_SOLUTION | Freq: Once | RESPIRATORY_TRACT | Status: AC
  Administered 2018-04-07: 0.63 mg via RESPIRATORY_TRACT

## 2018-04-07 MED ORDER — AMOXICILLIN-POT CLAVULANATE 875-125 MG PO TABS: 1.0000 | ORAL_TABLET | Freq: Two times a day (BID) | ORAL | 0 refills | Status: AC
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
## 2018-04-07 NOTE — Assessment & Plan Note (Signed)
Acute COPD exacerbation Check chest xray today  Xopenex nebulizer treatment given office. Patient declined prednisone says that she cannot tolerate this She requests a strep test   Plan  Patient Instructions  Augmentin 875 mg twice daily for 1 week Mucinex DM twice daily as needed for cough congestion Strep test Continue on Spiriva 1 puff daily Continue on Arnuity 1 puff daily May begin [employer] every 4hr as needed for wheezing . -sent to home care company.  Great job not smoking  Follow up with Dr. Halford Chessman  In 6 weeks and As needed   Please contact office for sooner follow up if symptoms do not improve or worsen or seek emergency care

## 2018-04-07 NOTE — Telephone Encounter (Signed)
Called and spoke with Patient. She is concerned that a prescription was not sent to pharmacy for a nebulizer.  Explained that a prescription was sent to [facility] and they would contact her in a few days.  Explained nebs were sent to [facility] per her request.  Patient stated understanding.  Nothing further at this time.

## 2018-04-07 NOTE — Progress Notes (Signed)
Reviewed and agree with assessment/plan.   Ireene Ballowe, MD [facility] 05/29/2016, 12:24 PM Pager:  [phone]  

## 2018-04-07 NOTE — Patient Instructions (Addendum)
Augmentin 875 mg twice daily for 1 week Mucinex DM twice daily as needed for cough congestion Strep test Continue on Spiriva 1 puff daily Continue on Arnuity 1 puff daily May begin [employer] every 4hr as needed for wheezing . -sent to home care company.  Great job not smoking  Follow up with Dr. Halford Chessman  In 6 weeks and As needed   Please contact office for sooner follow up if symptoms do not improve or worsen or seek emergency care

## 2018-04-08 ENCOUNTER — Telehealth: Payer: Self-pay | Admitting: Adult Health

## 2018-04-08 NOTE — Telephone Encounter (Signed)
Spoke with pt. She is aware of Michele [name]'s response. Nothing further was needed.

## 2018-04-08 NOTE — Progress Notes (Signed)
Your chest x-ray results of come back.  Showing no acute changes.  No plan of care changes at this time.  Keep follow-up appointment.    Follow-up with our office if symptoms worsen or you do not feel like you are improving under her current regimen.  It was a pleasure taking care of you,  [name] Mack, FNP 

## 2018-04-08 NOTE — Telephone Encounter (Signed)
Per TP: cxr shows COPD changes, no acute process.  Recommend to continue the Augmentin as prescribed yesterday and Xopenex neb as needed.  Please call the office if symptoms do not improve or they worsen.  Strep test was negative as well.  Thank you.

## 2018-04-08 NOTE — Telephone Encounter (Signed)
Spoke with pt. She also sent a MyChart message to [place] in regards to the same thing. Pt saw Tammy yesterday and was given Augmentin. She received a call from her PCP last night and was told that she possibly had a bacterial infection. Pt saw her PCP about a week but she states that they didn't go any testing to determine if she had an infection. Her pharmacy contacted her and told her that her PCP had sent in an antibiotic for her to take. She is confused as to which antibiotic she should take. I have advised her to contact her PCP in regards to the antibiotic they sent in for her. She agreed. Pt is requesting her CXR results from yesterday.   Tammy - please advise. Thanks.

## 2018-04-09 ENCOUNTER — Ambulatory Visit: Payer: Self-pay | Admitting: Gastroenterology

## 2018-04-10 NOTE — Progress Notes (Signed)
Called spoke with patient, advised of lab results / recs as stated by TP.  Pt verbalized understanding and denied any questions. 

## 2018-04-16 ENCOUNTER — Telehealth: Payer: Self-pay | Admitting: Adult Health

## 2018-04-16 MED ORDER — FLUCONAZOLE 150 MG PO TABS: 150.0000 mg | ORAL_TABLET | Freq: Every day | ORAL | 0 refills | Status: DC

## 2018-04-16 NOTE — Telephone Encounter (Signed)
Called and spoke with pt who believes she has a yeast infection from the abx she was prescribed.  Pt states symptoms started a couple days ago of itching and discomfort. Pt states she used monostat the one time applicator today of the cream applicator and wanted to know if something was prescribed, would it be okay for her to start taking it or if she should wait a couple days before doing so.  Tammy, please advise on this for pt. Thanks!

## 2018-04-16 NOTE — Telephone Encounter (Signed)
Can take Diflucan 150mg  daily x 1 dose . If not improving will need to see PCP or GYN  Would hold pravastatin x 3 days if she take diflucan .  Please contact office for sooner follow up if symptoms do not improve or worsen or seek emergency care

## 2018-04-17 ENCOUNTER — Encounter: Payer: Self-pay | Admitting: Physician Assistant

## 2018-04-17 ENCOUNTER — Ambulatory Visit (INDEPENDENT_AMBULATORY_CARE_PROVIDER_SITE_OTHER): Payer: Medicare Other | Admitting: Physician Assistant

## 2018-04-17 VITALS — BP 112/71 | HR 91 | Resp 16 | Ht 66.0 in | Wt 124.8 lb

## 2018-04-17 DIAGNOSIS — M79604 Pain in right leg: Secondary | ICD-10-CM

## 2018-04-17 DIAGNOSIS — E785 Hyperlipidemia, unspecified: Secondary | ICD-10-CM

## 2018-04-17 DIAGNOSIS — I2510 Atherosclerotic heart disease of native coronary artery without angina pectoris: Secondary | ICD-10-CM | POA: Diagnosis not present

## 2018-04-17 DIAGNOSIS — C3490 Malignant neoplasm of unspecified part of unspecified bronchus or lung: Secondary | ICD-10-CM | POA: Diagnosis not present

## 2018-04-17 DIAGNOSIS — I10 Essential (primary) hypertension: Secondary | ICD-10-CM

## 2018-04-17 DIAGNOSIS — I208 Other forms of angina pectoris: Secondary | ICD-10-CM | POA: Diagnosis not present

## 2018-04-17 DIAGNOSIS — M79605 Pain in left leg: Secondary | ICD-10-CM | POA: Diagnosis not present

## 2018-04-17 DIAGNOSIS — J449 Chronic obstructive pulmonary disease, unspecified: Secondary | ICD-10-CM | POA: Diagnosis not present
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
## 2018-04-17 NOTE — Patient Instructions (Addendum)
Medication Instructions:  Your physician recommends that you continue on your current medications as directed. Please refer to the Current Medication list given to you today. If you need a refill on your cardiac medications before your next appointment, please call your pharmacy.   Lab work: Your physician recommends that you return for lab work in: End of December 2019-Lipid, lft If you have labs (blood work) drawn today and your tests are completely normal, you will receive your results only by: [name] MyChart Message (if you have MyChart) OR . A paper copy in the mail If you have any lab test that is abnormal or we need to change your treatment, we will call you to review the results.  Testing/Procedures: Your physician has requested that you have an ankle brachial index (ABI). During this test an ultrasound and blood pressure cuff are used to evaluate the arteries that supply the arms and legs with blood. Allow thirty minutes for this exam. There are no restrictions or special instructions. Schedule in December 2019  Follow-Up: At [facility], you and your health needs are our priority.  As part of our continuing mission to provide you with exceptional heart care, we have created designated Provider Care Teams.  These Care Teams include your primary Cardiologist (physician) and Advanced Practice Providers (APPs -  Physician Assistants and Nurse Practitioners) who all work together to provide you with the care you need, when you need it. You will need a follow up appointment in 6 months.  Please call our office 2 months in advance to schedule this appointment.  You may see Glenetta Hew, MD or one of the following Advanced Practice Providers on your designated Care Team:   Rosaria Ferries, PA-C . Jory Sims, DNP, ANP  Any Other Special Instructions Will Be Listed Below (If Applicable).

## 2018-04-20 ENCOUNTER — Telehealth: Payer: Self-pay | Admitting: Adult Health

## 2018-04-20 NOTE — Telephone Encounter (Signed)
Spoke with patient. She stated that she already has the Diflucan. Advised her that TP stated that she takes the Diflucan, she needs to hold off on taking the pravastatin for 3 days. She verbalized understanding. Nothing further needed at time of call.

## 2018-04-22 DIAGNOSIS — C3490 Malignant neoplasm of unspecified part of unspecified bronchus or lung: Secondary | ICD-10-CM | POA: Diagnosis not present

## 2018-04-22 DIAGNOSIS — K219 Gastro-esophageal reflux disease without esophagitis: Secondary | ICD-10-CM | POA: Diagnosis not present

## 2018-04-22 DIAGNOSIS — R7303 Prediabetes: Secondary | ICD-10-CM | POA: Diagnosis not present

## 2018-04-22 DIAGNOSIS — F419 Anxiety disorder, unspecified: Secondary | ICD-10-CM | POA: Diagnosis not present

## 2018-04-22 DIAGNOSIS — J441 Chronic obstructive pulmonary disease with (acute) exacerbation: Secondary | ICD-10-CM | POA: Diagnosis not present

## 2018-04-24 ENCOUNTER — Encounter: Payer: Self-pay | Admitting: Adult Health

## 2018-04-24 ENCOUNTER — Ambulatory Visit (INDEPENDENT_AMBULATORY_CARE_PROVIDER_SITE_OTHER): Payer: Medicare Other | Admitting: Adult Health

## 2018-04-24 ENCOUNTER — Ambulatory Visit (INDEPENDENT_AMBULATORY_CARE_PROVIDER_SITE_OTHER)
Admission: RE | Admit: 2018-04-24 | Discharge: 2018-04-24 | Disposition: A | Discharge status: Home / Self Care | Payer: Medicare Other | Source: Ambulatory Visit | Attending: Adult Health | Admitting: Adult Health

## 2018-04-24 ENCOUNTER — Telehealth: Payer: Self-pay | Admitting: Cardiology

## 2018-04-24 ENCOUNTER — Telehealth: Payer: Self-pay | Admitting: Adult Health

## 2018-04-24 VITALS — BP 104/70 | HR 85 | Temp 98.0°F | Ht 66.0 in | Wt 124.0 lb

## 2018-04-24 DIAGNOSIS — J449 Chronic obstructive pulmonary disease, unspecified: Secondary | ICD-10-CM

## 2018-04-24 DIAGNOSIS — I208 Other forms of angina pectoris: Secondary | ICD-10-CM

## 2018-04-24 DIAGNOSIS — R590 Localized enlarged lymph nodes: Secondary | ICD-10-CM | POA: Insufficient documentation

## 2018-04-24 DIAGNOSIS — R0602 Shortness of breath: Secondary | ICD-10-CM | POA: Diagnosis not present

## 2018-04-24 LAB — CBC WITH DIFFERENTIAL/PLATELET
Basophils Absolute: 0 10*3/uL (ref 0.0–0.1)
Basophils Relative: 0 % (ref 0.0–3.0)
Eosinophils Absolute: 0 10*3/uL (ref 0.0–0.7)
Eosinophils Relative: 0 % (ref 0.0–5.0)
HCT: 42.3 % (ref 36.0–46.0)
Hemoglobin: 14.6 g/dL (ref 12.0–15.0)
Lymphocytes Relative: 3.6 % — ABNORMAL LOW (ref 12.0–46.0)
Lymphs Abs: 0.3 10*3/uL — ABNORMAL LOW (ref 0.7–4.0)
MCHC: 34.6 g/dL (ref 30.0–36.0)
MCV: 92.3 fl (ref 78.0–100.0)
Monocytes Absolute: 0.1 10*3/uL (ref 0.1–1.0)
Monocytes Relative: 1.5 % — ABNORMAL LOW (ref 3.0–12.0)
Neutro Abs: 6.7 10*3/uL (ref 1.4–7.7)
Neutrophils Relative %: 94.9 % — ABNORMAL HIGH (ref 43.0–77.0)
Platelets: 177 10*3/uL (ref 150.0–400.0)
RBC: 4.59 Mil/uL (ref 3.87–5.11)
RDW: 13.1 % (ref 11.5–15.5)
WBC: 7.1 10*3/uL (ref 4.0–10.5)

## 2018-04-24 LAB — BASIC METABOLIC PANEL
BUN: 15 mg/dL (ref 6–23)
CO2: 30 mEq/L (ref 19–32)
Calcium: 10 mg/dL (ref 8.4–10.5)
Chloride: 105 mEq/L (ref 96–112)
Creatinine, Ser: 0.76 mg/dL (ref 0.40–1.20)
GFR: 81.41 mL/min (ref >60.00)
Glucose, Bld: 196 mg/dL — ABNORMAL HIGH (ref 70–99)
Potassium: 4.4 mEq/L (ref 3.5–5.1)
Sodium: 140 mEq/L (ref 135–145)

## 2018-04-24 LAB — BRAIN NATRIURETIC PEPTIDE: Pro B Natriuretic peptide (BNP): 93 pg/mL (ref 0.0–100.0)

## 2018-04-24 MED ORDER — FLUTICASONE-UMECLIDIN-VILANT 100-62.5-25 MCG/INH IN AEPB: 1.0000 | INHALATION_SPRAY | Freq: Every day | RESPIRATORY_TRACT | 0 refills | Status: DC

## 2018-04-24 MED ORDER — FLUTICASONE-UMECLIDIN-VILANT 100-62.5-25 MCG/INH IN AEPB: 1.0000 | INHALATION_SPRAY | Freq: Every day | RESPIRATORY_TRACT | 3 refills | Status: DC
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
## 2018-04-24 NOTE — Assessment & Plan Note (Signed)
Left submandibular adenopathy ? Etiology  Vs salivary stone .  Conservative tx , if not resolving with need ENT referral  Please contact office for sooner follow up if symptoms do not improve or worsen or seek emergency care

## 2018-04-24 NOTE — Patient Instructions (Addendum)
Finish Doxycyline and Prednisone taper.  Sputum culture today  Mucinex DM Twice daily  As needed  Cough/congestion .  Hold Spiriva and Arnuity .  Begin TRELEGY 1 puff daily , rinse after use .  May begin Xopenex Neb every 4hr as needed for wheezing.  Great job not smoking  Chest xray today .  Warm compresses to lower jaw , try sour candy . If tender gland and sore throat does not not resolve in 1 week will ENT referral  Hold fish oil until cough is better and then may restart.  Follow up with Dr. Halford Chessman  In 3-4 weeks and As needed   Please contact office for sooner follow up if symptoms do not improve or worsen or seek emergency care

## 2018-04-24 NOTE — Telephone Encounter (Signed)
Received incoming paged regarding the patient taking Vascepa. She was seen my pulmonary medicine earlier today and it was suggested that she stop taking the Vascepa for a brief period of time to see if this would help reduce her nausea when she coughs. Pt reached out to cardiology for the okay. She has been taking this medication for years and has never had an issue. She was recently dx and treated for URI and has a hx of COPD, emphysema and lung cancer. I have suggested that she may stop it for a few days if she wishes to see if this will help. I ensured her that this will not bother her to not take it for a few days and then resume. She acknowledges this however states that she will likely continue for now.    Kathyrn Drown NP-[facility] [place] Pager: [phone]

## 2018-04-24 NOTE — Telephone Encounter (Signed)
Called and spoke with pt who saw TP 04/07/18 and then saw PCP 04/22/18 and was started on a different abx. Pt was prescribed prednisone, doxycycline, and promathezine cough meds. Pt stated last night, 04/23/18 she started coughing up some bloody mucus. Pt states not every time the mucus had blood in it but had noticed it a couple of times.  Pt does not have any increased chest tightness. States when she went to see PCP 04/22/18, she was running a fever. Pt also has complaints of a knot on the left side of her neck.  I advised pt that we needed her to come in for an appt to have the bloody mucus addressed. Pt expressed understanding. OV has been scheduled for pt to see TP today, 04/24/18 at 2:30pm. Nothing further needed.

## 2018-04-27 ENCOUNTER — Ambulatory Visit (HOSPITAL_COMMUNITY)
Admission: RE | Admit: 2018-04-27 | Discharge: 2018-04-27 | Disposition: A | Discharge status: Home / Self Care | Payer: Medicare Other | Source: Ambulatory Visit | Attending: Pulmonary Disease | Admitting: Pulmonary Disease

## 2018-04-27 ENCOUNTER — Telehealth: Payer: Self-pay | Admitting: Adult Health

## 2018-04-27 ENCOUNTER — Other Ambulatory Visit: Payer: Self-pay | Admitting: Physician Assistant

## 2018-04-27 DIAGNOSIS — R0602 Shortness of breath: Secondary | ICD-10-CM | POA: Diagnosis not present

## 2018-04-27 DIAGNOSIS — J449 Chronic obstructive pulmonary disease, unspecified: Secondary | ICD-10-CM

## 2018-04-27 DIAGNOSIS — R7989 Other specified abnormal findings of blood chemistry: Secondary | ICD-10-CM

## 2018-04-27 DIAGNOSIS — R05 Cough: Secondary | ICD-10-CM | POA: Diagnosis not present

## 2018-04-27 LAB — RESPIRATORY CULTURE OR RESPIRATORY AND SPUTUM CULTURE
MICRO NUMBER:: 91411500
RESULT:: NORMAL
SPECIMEN QUALITY:: ADEQUATE

## 2018-04-27 LAB — D-DIMER, QUANTITATIVE: D-Dimer, Quant: 0.57 mcg/mL FEU — ABNORMAL HIGH (ref <0.50)

## 2018-04-27 MED ORDER — IOPAMIDOL (ISOVUE-370) INJECTION 76%
100.0000 mL | Freq: Once | INTRAVENOUS | Status: AC | PRN
  Administered 2018-04-27: 100 mL via INTRAVENOUS

## 2018-04-27 MED ORDER — SODIUM CHLORIDE (PF) 0.9 % IJ SOLN
INTRAMUSCULAR | Status: AC
  Filled 2018-04-27: qty 50

## 2018-04-27 MED ORDER — IOPAMIDOL (ISOVUE-370) INJECTION 76%
INTRAVENOUS | Status: AC
  Filled 2018-04-27: qty 100

## 2018-04-27 NOTE — Progress Notes (Signed)
Called spoke with patient, advised of lab results / recs as stated by TP.  Pt verbalized understanding and denied any questions. 

## 2018-04-27 NOTE — Telephone Encounter (Signed)
Called and spoke with patient regarding her results Informed the patient of results and recommendations today. Placed order for CT scan for Protocol of mild elevated D Dimer for PE Pt verbalized understanding and denied any questions or concerns at this time.   Patient is requesting VS review the other labs to make sure all is okay per pt request Pt is having increase leg pain, and back pain. She states pain hurts her so much to walk. She refuses to have ov appt this week, and refused going to ED Advised pt that VS is out of the office this week and will try to get the results as soon as possible.  VS please advise.

## 2018-04-28 ENCOUNTER — Other Ambulatory Visit: Payer: Self-pay | Admitting: Physician Assistant

## 2018-04-29 ENCOUNTER — Telehealth: Payer: Self-pay | Admitting: Cardiology

## 2018-04-29 IMAGING — MR MR HEAD W/O CM
9 of 10 series · 40 of 48 positions shown · non-contrast
Comparison: CT head 07/16/2014.  MR head 12/18/2013.

CLINICAL DATA: Headache for 6 months.  History of migraines.

EXAM:
MRI HEAD WITHOUT CONTRAST
TECHNIQUE: Multiplanar, multiecho pulse sequences of the brain and surrounding
structures were obtained without intravenous contrast.

[Series 2: T1 · sagittal · 5.0mm · 0.45mm/px · 3 of 21 slices shown]
[im 1/21]
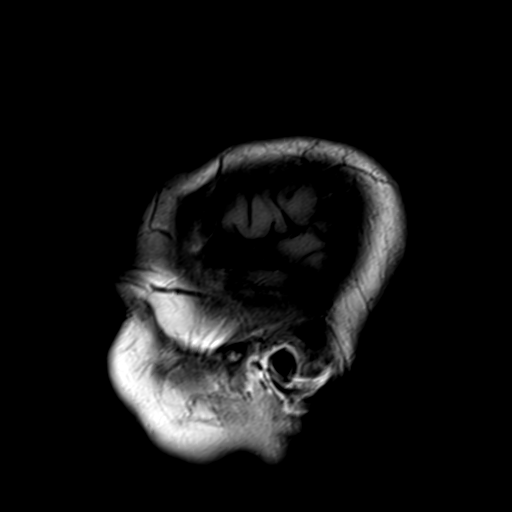
[im 11/21]
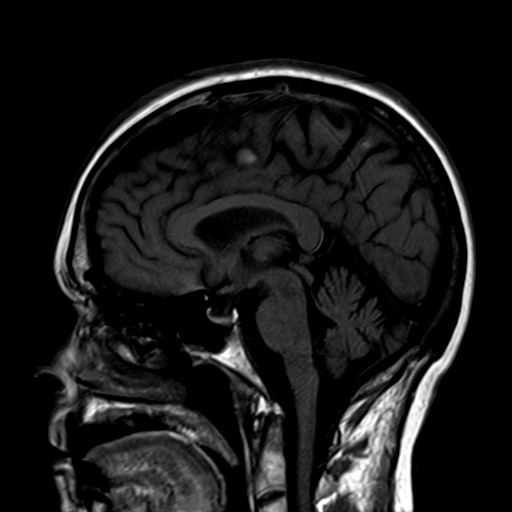
[im 21/21]
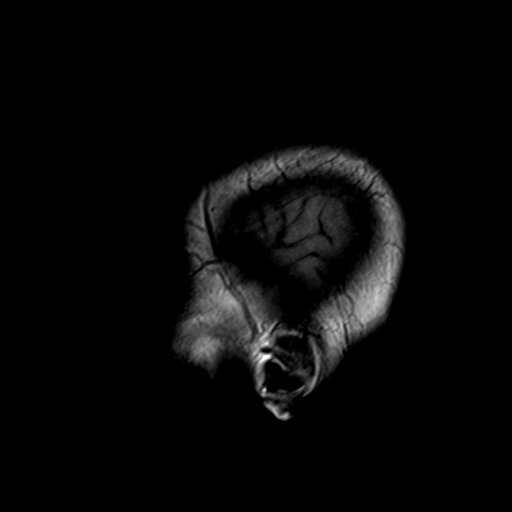

[Series 3: ax ep2d_diff_(id)_trace · axial · 3.0mm · 1.80mm/px · z∈[-66,+81]mm · 10 of 100 slices shown]
[im 1/100]
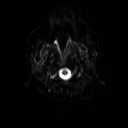
[im 12/100]
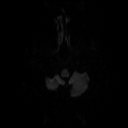
[im 23/100]
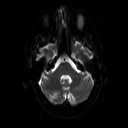
[im 34/100]
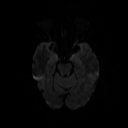
[im 45/100]
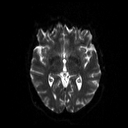
[im 56/100]
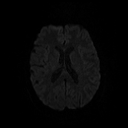
[im 67/100]
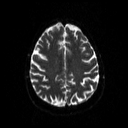
[im 78/100]
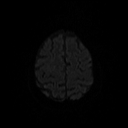
[im 89/100]
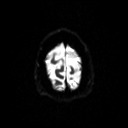
[im 100/100]
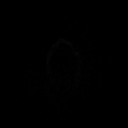

[Series 4: ax ep2d_diff_(id)_trace_adc · axial · 3.0mm · 1.80mm/px · z∈[-66,+81]mm · 5 of 50 slices shown]
[im 1/50]
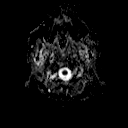
[im 13/50]
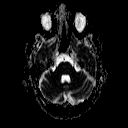
[im 25/50]
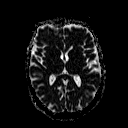
[im 37/50]
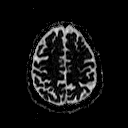
[im 50/50]
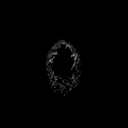

[Series 6: swi_images · axial · 2.0mm · 0.90mm/px · z∈[-72,+86]mm · 8 of 80 slices shown]
[im 1/80]
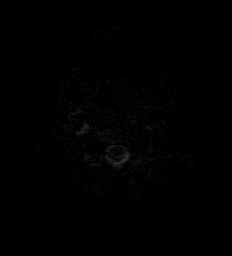
[im 12/80]
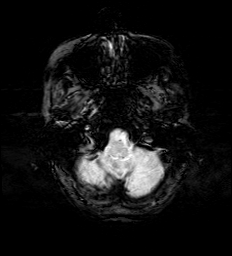
[im 23/80]
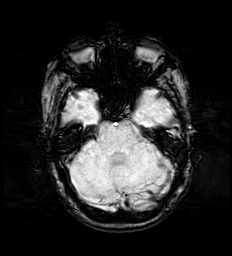
[im 34/80]
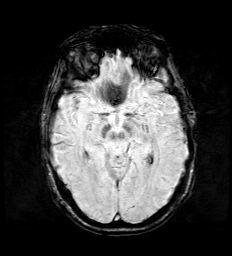
[im 46/80]
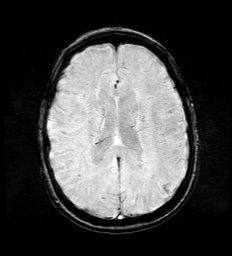
[im 57/80]
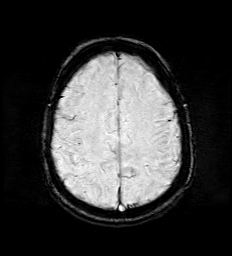
[im 68/80]
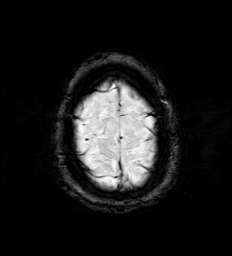
[im 80/80]
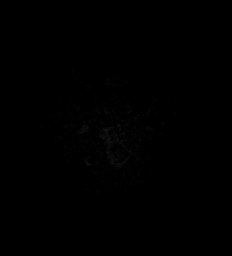

[Series 7: cor ep2d_diff · coronal · 5.0mm · 1.77mm/px · 5 of 48 slices shown]
[im 1/48]
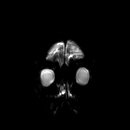
[im 12/48]
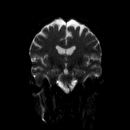
[im 24/48]
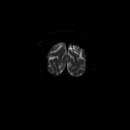
[im 36/48]
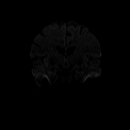
[im 48/48]
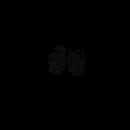

[Series 8: cor ep2d_diff_adc · coronal · 5.0mm · 1.77mm/px · 2 of 24 slices shown]
[im 1/24]
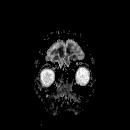
[im 24/24]
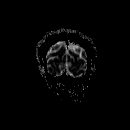

[Series 9: FLAIR · axial · 5.0mm · 0.45mm/px · z∈[-59,+79]mm · 2 of 23 slices shown]
[im 1/23]
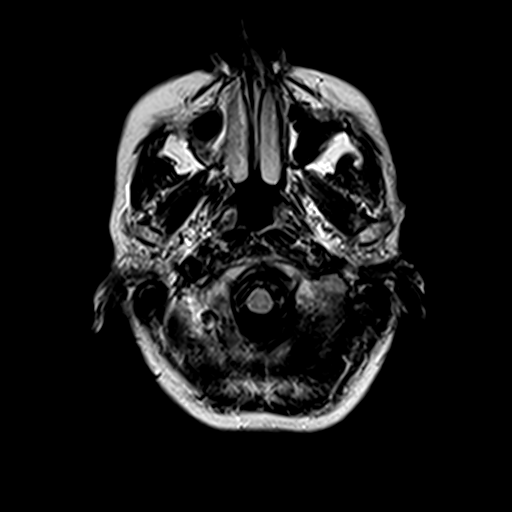
[im 23/23]
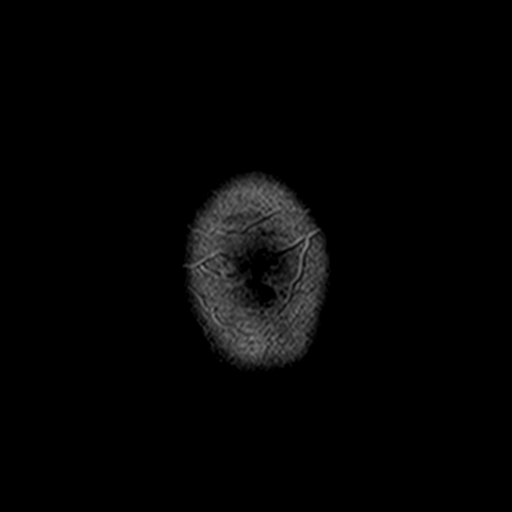

[Series 10: T2 · axial · 5.0mm · 0.60mm/px · z∈[-58,+79]mm · 2 of 23 slices shown (1 of 2)]
[im 1/23]
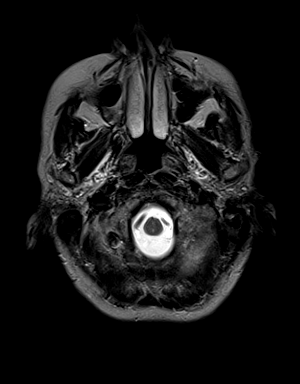
[im 23/23]
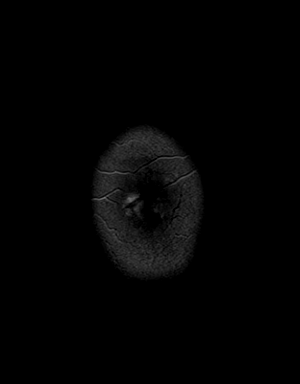

[Series 12: T2 · coronal · 5.0mm · 0.45mm/px · 3 of 25 slices shown (2 of 2)]
[im 1/25]
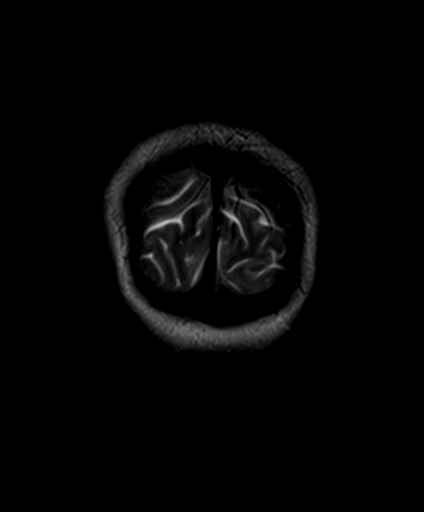
[im 13/25]
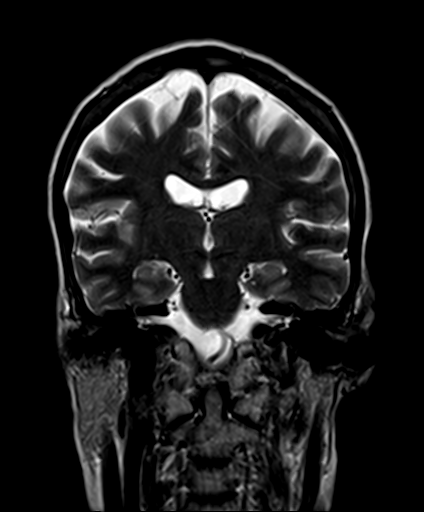
[im 25/25]
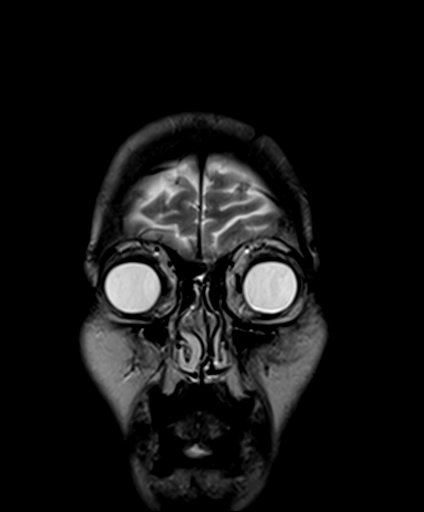

[40 of 48 positions shown; findings below may reference images not displayed]

FINDINGS: No evidence for acute infarction, hemorrhage, mass lesion,
hydrocephalus, or extra-axial fluid. Normal cerebral volume. No
significant white matter disease.

Partial empty sella.  Otherwise no midline abnormality.

Flow voids are maintained throughout.  No chronic hemorrhage.

No sinus or mastoid disease. Negative orbits. Extracranial soft
tissues unremarkable.

Compared with priors, similar appearance.
IMPRESSION: Partial empty sella, of doubtful significance with regard to the
patient's worsening headaches.

Otherwise unremarkable MR brain.

## 2018-04-29 NOTE — Telephone Encounter (Signed)
VS please advise once available, thank you.

## 2018-04-29 NOTE — Telephone Encounter (Addendum)
Returned call to patient. She states she had an elevated D dimer that was drawn by pulmonology.  She states pulmonology did CT and this was negative for PE.   She is concerned because she is still having pain in her legs.   She has scheduled LE art doppler 12/19 and is concerned this is too far out.     LE art doppler rescheduled for 12/12 at 9, patient would like to have her fasting lab work this AM as well.     Patient also states she was told she has AAA and Dr. [name] was going to follow this.   She states this has been 2 years.     Advised per last OV note: AAA (abdominal aortic aneurysm) without rupture (HCC) (Chronic)     bHad planned to check Abd Aortic CTA in August -- she asked about this.   I think we can hold off & wait until she has Cancer related CT scans -- if there remains concern, can check CTA abdomen.

## 2018-04-29 NOTE — Telephone Encounter (Signed)
Patient called stating her D-Dimer was elevated. She would like to make Dr. [name] aware. She is wondering is Dr. [name] is going to send her further testing.

## 2018-04-29 NOTE — Telephone Encounter (Signed)
New Message:   Patient requesting upper arteria dopplers. Please call patient call or put in a order

## 2018-05-04 NOTE — Telephone Encounter (Signed)
I have a question about RESP CULT OR RESP AND SPUT CULT resulted on 04/27/18 at 11:01 AM.I don't understand these results can you please call me Michele [name] [phone] __________________________________________________________________  I have attached patients e-mail above concerning her results for the sputum culture. I see where her other labs were resulted and she was notified but I don't see anything regarding the sputum. Tammy please advise once available and I will contact the patient. Thank you.

## 2018-05-05 ENCOUNTER — Telehealth: Payer: Self-pay | Admitting: Adult Health

## 2018-05-05 NOTE — Telephone Encounter (Signed)
She emailed about sputum cx results on on 04/24/18  Showed normal oral flora .  Cont w/ office visit recs and follow up  Please contact office for sooner follow up if symptoms do not improve or worsen or seek emergency care

## 2018-05-05 NOTE — Telephone Encounter (Signed)
CT angio chest 04/27/18 >> diffuse dilation and thickening of esophagus, severe bullous emphysema, bronchiectasis, kyphosis   Left message on patients voicemail explain she has expected changes from emphysema.  She also has dilation and thickening of esophagus.  Advised her to f/u with GI if she is having symptoms of reflux.  Advised her to call back with questions.

## 2018-05-05 NOTE — Telephone Encounter (Signed)
Michele Needles, NP       05/05/18 2:28 PM  Note    She emailed about sputum cx results on on 04/24/18  Showed normal oral flora .  Cont w/ office visit recs and follow up  Please contact office for sooner follow up if symptoms do not improve or worsen or seek emergency care

## 2018-05-07 ENCOUNTER — Telehealth: Payer: Self-pay | Admitting: Adult Health

## 2018-05-07 NOTE — Telephone Encounter (Signed)
Called and spoke to patient, patient states she was returning a phone call to [facility]. Upon review I do not see a phone note. I explained to the patient that I would let [facility] know.   Kellie please advise.

## 2018-05-07 NOTE — Telephone Encounter (Signed)
Attempted to call patient today regarding TP recommendations. I did not receive an answer at time of call. I have left a voicemail message for pt to return call. X1

## 2018-05-07 NOTE — Telephone Encounter (Signed)
My note is in previous message regarding pt's results. Attempted to call patient today regarding results. I did not receive an answer at time of call. I have left a voicemail message for pt to return call. X1

## 2018-05-08 NOTE — Telephone Encounter (Signed)
Called and spoke with patient regarding results.  Informed the patient of results and recommendations today. Pt verbalized understanding and denied any questions or concerns at this time.  Nothing further needed.  

## 2018-05-12 NOTE — Progress Notes (Signed)
Reviewed and agree with assessment/plan.   Zurich Carreno, MD [facility] 05/29/2016, 12:24 PM Pager:  [phone]  

## 2018-05-14 ENCOUNTER — Ambulatory Visit (HOSPITAL_COMMUNITY)
Admission: RE | Admit: 2018-05-14 | Discharge: 2018-05-14 | Disposition: A | Discharge status: Home / Self Care | Payer: Medicare Other | Source: Ambulatory Visit | Attending: Cardiology | Admitting: Cardiology

## 2018-05-14 DIAGNOSIS — M79605 Pain in left leg: Secondary | ICD-10-CM | POA: Diagnosis not present

## 2018-05-14 DIAGNOSIS — E785 Hyperlipidemia, unspecified: Secondary | ICD-10-CM | POA: Diagnosis not present

## 2018-05-14 DIAGNOSIS — M79604 Pain in right leg: Secondary | ICD-10-CM | POA: Diagnosis not present

## 2018-05-14 LAB — LIPID PANEL
Chol/HDL Ratio: 3.4 ratio (ref 0.0–4.4)
Cholesterol, Total: 148 mg/dL (ref 100–199)
HDL: 44 mg/dL (ref >39)
LDL Calculated: 84 mg/dL (ref 0–99)
Triglycerides: 98 mg/dL (ref 0–149)
VLDL Cholesterol Cal: 20 mg/dL (ref 5–40)

## 2018-05-14 LAB — HEPATIC FUNCTION PANEL
ALT: 6 IU/L (ref 0–32)
AST: 12 IU/L (ref 0–40)
Albumin: 4.2 g/dL (ref 3.6–4.8)
Alkaline Phosphatase: 67 IU/L (ref 39–117)
Bilirubin Total: 0.3 mg/dL (ref 0.0–1.2)
Bilirubin, Direct: 0.1 mg/dL (ref 0.00–0.40)
Total Protein: 5.9 g/dL — ABNORMAL LOW (ref 6.0–8.5)

## 2018-05-14 NOTE — Progress Notes (Signed)
Normal blood flow in the leg

## 2018-05-19 ENCOUNTER — Encounter: Payer: Self-pay | Admitting: Pulmonary Disease

## 2018-05-19 ENCOUNTER — Ambulatory Visit (INDEPENDENT_AMBULATORY_CARE_PROVIDER_SITE_OTHER): Payer: Medicare Other | Admitting: Pulmonary Disease

## 2018-05-19 VITALS — BP 118/72 | HR 114 | Ht 66.0 in | Wt 122.0 lb

## 2018-05-19 DIAGNOSIS — J449 Chronic obstructive pulmonary disease, unspecified: Secondary | ICD-10-CM | POA: Diagnosis not present

## 2018-05-19 NOTE — Patient Instructions (Addendum)
Arnuity one puff daily, and rinse mouth after each use  Spiriva one puff daily  Flutter valve daily after using inhalers when you have chest congestion  Mucinex twice per day as needed when you have chest congestion  Follow up in 2 months

## 2018-05-19 NOTE — Progress Notes (Signed)
[facility]  Chief Complaint  Patient presents with  . Follow-up    C/o still coughing-white,sob,occass. wheezing,left sided chest tightness and pain,denies fcs    Constitutional:  BP 118/72 (BP Location: Left Arm, Cuff Size: Normal)   Pulse (!) 114   Ht 5\' 6"  (1.676 m)   Wt 122 lb (55.3 kg)   SpO2 92%   BMI 19.69 kg/m   Past Medical History:  Vitamin D deficiency, Seizures, RLS, Chronic back pain, HTN, Hiatal hernia, GERD, HLD, Palpitations, Depression, CAD, Cataracts, OA, Anxiety  Brief Summary:  Michele [name] is a 62 y.o. female former smokerwithCOPD/emphysema. 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  She has cough with clear sputum.  Gets discomfort in lower left chest and upper left abdomen.  She has appt with GI coming up.  She has been using spiriva daily, but forgets to use arnuity.  She feels her inhalers do help.  She gets intermittent wheezing.  Hard for her to keep up with activities because of getting short of breath.  Not having fever, hemoptysis, or leg swelling.    Maintain SpO2 > 90% on room air while walking today.  Physical Exam:   Appearance - well kempt   ENMT - clear nasal mucosa, midline nasal  septum, no oral exudates, no LAN, trachea midline  Respiratory - normal chest wall, normal respiratory effort, no accessory muscle use, no wheeze/rales, decreased breath sounds  CV - s1s2 regular rate and rhythm, no murmurs, no peripheral edema, radial pulses symmetric  GI - soft, non tender, no masses  Lymph - no adenopathy noted in neck and axillary areas  MSK - normal gait  Ext - no cyanosis, clubbing, or joint inflammation noted  Skin - no rashes, lesions, or ulcers  Neuro - normal strength, oriented x 3  Psych - normal mood and affect  Assessment/Plan:   COPD with emphysema and chronic bronchitis. - advised her to use arnuity on daily basis - continue spiriva - add flutter valve -  prn mucinex - prn xopenex - discussed natural progression of COPD and emphysema and what to monitor for  Hx of small cell lung cancer. - f/u with Dr. Julien Nordmann  Atypical chest pain with reflux and hiatal hernia. - she has appt with GI  Patient Instructions  Arnuity one puff daily, and rinse mouth after each use  Spiriva one puff daily  Flutter valve daily after using inhalers when you have chest congestion  Mucinex twice per day as needed when you have chest congestion  Follow up in 2 months  Time spent 27 minutes  [name], MD [facility] Pager: [phone] 05/19/2018, 11:13 AM  Flow Sheet     Pulmonary tests:  PFT 12/23/11 >> FEV1 2.13 (85%), FEV1% 59, TLC 5.98 (112%), DLCO 60% Spirometry 11/19/17 >> FEV1 2.58 (100%), FEV1% 76 FeNO 11/19/17 >> 5  Chest imaging:  CT chest 08/24/16 >> CAD, mod/severe emphysema CT chest 08/29/17 >> severe emphysema CT chest 03/02/18 >> severe centrilobular and paraseptal emphysema, 4 mm nodule RUL partially calcified CT angio chest 04/27/18 >> atherosclerosis, small/mod hiatal hernia, dilation of esophagus with mucosal thickening, severe emphysema with large bulla, bronchiectasis (reviewed by me)  Cardiac tests:  Echo 08/30/17 >> EF 60 to 65%, grade 1 DD, mild MR  Medications:   Allergies as of 05/19/2018      Reactions   Ciprofloxacin Other (See Comments)   Hypoglycemia    Aspirin Other (See  Comments)   GI upset; patient is only able to take enteric coated Aspirin.     Crestor [rosuvastatin] Other (See Comments)   Muscle pain    Ibuprofen Other (See Comments)   GI upset    Wellbutrin [bupropion] Palpitations   Zoloft [sertraline Hcl] Other (See Comments)   Insomnia, fatigue    Albuterol Palpitations   Lipitor [atorvastatin] Other (See Comments)   Muscle pain    Sulfonamide Derivatives Itching, Rash      Medication List       Accurate as of May 19, 2018 11:13 AM. Always use your most recent  med list.        ALPRAZolam 1 MG tablet Commonly known as:  XANAX Take 1 mg by mouth 3 (three) times daily.   CALCIUM 600+D 600-400 MG-UNIT tablet Generic drug:  Calcium Carbonate-Vitamin D Take 1 tablet by mouth daily.   clopidogrel 75 MG tablet Commonly known as:  PLAVIX TAKE 1 TABLET(75 MG) BY MOUTH DAILY   CoQ10 100 MG Caps Take 100 mg by mouth daily.   Fluticasone Furoate 100 MCG/ACT Aepb Commonly known as:  ARNUITY ELLIPTA Inhale 1 puff into the lungs daily.   isosorbide mononitrate 30 MG 24 hr tablet Commonly known as:  IMDUR Take 30 mg by mouth as directed.   levalbuterol 0.63 MG/3ML nebulizer solution Commonly known as:  XOPENEX Take 3 mLs (0.63 mg total) by nebulization every 4 (four) hours as needed for wheezing or shortness of breath (dx: R00.2, J44.9).   levalbuterol 45 MCG/ACT inhaler Commonly known as:  XOPENEX HFA Inhale 1-2 puffs into the lungs every 6 (six) hours as needed for wheezing.   nitroGLYCERIN 0.4 MG SL tablet Commonly known as:  NITROSTAT DISSOLVE 1 TABLET UNDER THE TONGUE EVERY 5 MINUTES AS NEEDED FOR CHEST PAIN   pantoprazole 40 MG tablet Commonly known as:  PROTONIX Take 40 mg by mouth daily.   pravastatin 20 MG tablet Commonly known as:  PRAVACHOL Take 1 tablet (20 mg total) by mouth daily.   senna 8.6 MG tablet Commonly known as:  SENOKOT Take 1 tablet by mouth daily.   tiotropium 18 MCG inhalation capsule Commonly known as:  SPIRIVA Place 18 mcg into inhaler and inhale daily.   VASCEPA 1 g Caps Generic drug:  Icosapent Ethyl TK 1 CS PO BID       Past Surgical History:  She  has a past surgical history that includes Leg wound repair / closure (2376); Coronary angioplasty with stent (10/10/11); Coronary angioplasty with stent (11/06/11); Tonsillectomy; Tubal ligation (1970's); Knee surgery; Coronary angioplasty with stent (11/19/2012); NM MYOVIEW LTD (October 2013; 12/2013); CPET (09/07/2012); Cardiac catheterization  (03/02/2014); left heart catheterization with coronary angiogram (N/A, 10/10/2011); percutaneous coronary stent intervention (pci-s) (N/A, 11/06/2011); left heart catheterization with coronary angiogram (N/A, 11/19/2012); left heart catheterization with coronary angiogram (N/A, 02/19/2013); left heart catheterization with coronary angiogram (N/A, 03/02/2014); Colonoscopy; Polypectomy; Video bronchoscopy with endobronchial ultrasound (N/A, 02/14/2016); Direct laryngoscopy (N/A, 02/14/2016); OTHER SURGICAL HISTORY; Breast biopsy (2000's); NM MYOVIEW LTD (02/2016); lipoma surgery (Left, 10/2016); transthoracic echocardiogram (May 2013; September 2015); and transthoracic echocardiogram (08/30/2017).  Family History:  Her family history includes Asthma in her mother; Cancer in her maternal grandmother; Colon polyps in her mother; Diabetes in her mother; Emphysema in her mother; Heart attack in her maternal grandfather; Heart disease in her father and mother; Hyperlipidemia in her mother; Hypertension in her mother; Kidney cancer in her brother; Stomach cancer in her brother and brother; Stroke in her  mother; Stroke (age of onset: 39) in her brother.  Social History:  She  reports that she quit smoking about 9 months ago. Her smoking use included cigarettes. She has a 60.00 pack-year smoking history. She has never used smokeless tobacco. She reports that she does not drink alcohol or use drugs.

## 2018-05-20 ENCOUNTER — Telehealth: Payer: Self-pay | Admitting: Physician Assistant

## 2018-05-20 ENCOUNTER — Other Ambulatory Visit: Payer: Self-pay | Admitting: Pulmonary Disease

## 2018-05-20 ENCOUNTER — Telehealth: Payer: Self-pay

## 2018-05-20 ENCOUNTER — Encounter: Payer: Self-pay | Admitting: Nurse Practitioner

## 2018-05-20 ENCOUNTER — Ambulatory Visit (INDEPENDENT_AMBULATORY_CARE_PROVIDER_SITE_OTHER): Payer: Medicare Other | Admitting: Nurse Practitioner

## 2018-05-20 VITALS — BP 106/60 | HR 82 | Ht 66.0 in | Wt 124.0 lb

## 2018-05-20 DIAGNOSIS — K625 Hemorrhage of anus and rectum: Secondary | ICD-10-CM | POA: Diagnosis not present

## 2018-05-20 DIAGNOSIS — R079 Chest pain, unspecified: Secondary | ICD-10-CM

## 2018-05-20 DIAGNOSIS — R9389 Abnormal findings on diagnostic imaging of other specified body structures: Secondary | ICD-10-CM

## 2018-05-20 DIAGNOSIS — I208 Other forms of angina pectoris: Secondary | ICD-10-CM

## 2018-05-20 DIAGNOSIS — K5900 Constipation, unspecified: Secondary | ICD-10-CM | POA: Diagnosis not present

## 2018-05-20 MED ORDER — PRAVASTATIN SODIUM 40 MG PO TABS: 40.0000 mg | ORAL_TABLET | Freq: Every evening | ORAL | 3 refills | Status: DC

## 2018-05-20 MED ORDER — FLUTTER DEVI: 1.0000 "application " | Freq: Two times a day (BID) | 0 refills | Status: DC

## 2018-05-20 NOTE — Patient Instructions (Signed)
If you are age 65 or older, your body mass index should be between 23-30. Your Body mass index is 20.01 kg/m. If this is out of the aforementioned range listed, please consider follow up with your Primary Care Provider.  If you are age 26 or younger, your body mass index should be between 19-25. Your Body mass index is 20.01 kg/m. If this is out of the aformentioned range listed, please consider follow up with your Primary Care Provider.   We will send a letter to both your cardiologist and pulmonologist regarding clearance for the procedures.   Please start taking the Equate fiber, twice a day EVERYDAY!!!!   Use the glycerin suppositories as needed. Use the Doculax enemas as needed. Miralax as needed.   It was a pleasure to see you today!  Tye Savoy, NP

## 2018-05-20 NOTE — Telephone Encounter (Signed)
Notes recorded by Almyra Deforest, PA on 05/18/2018 at 4:21 PM EST Liver function ok, bad cholesterol still borderline elevated at 84, goal is < 70. Recommend increase pravastatin to 40mg  daily to see if able to tolerate.

## 2018-05-20 NOTE — Telephone Encounter (Signed)
Author: Milus Banister, MD Service: Gastroenterology Author Type: Physician  Filed: 05/20/2018 12:51 PM Encounter Date: 05/20/2018 Status: Signed  Editor: Milus Banister, MD (Physician)     Show:Clear all [x] Manual[] Template[] Copied  Added by: [x] Milus Banister, MD  [] Hover for details I agree with the above note, plan.  EGD at [facility], my next available.  I'll forward this to Ashaunti Treptow as well to help with scheduling.  Thanks

## 2018-05-20 NOTE — Telephone Encounter (Signed)
Thank you :)

## 2018-05-20 NOTE — Progress Notes (Signed)
I agree with the above note, plan.  EGD at [facility], my next available.  I'll forward this to Patty as well to help with scheduling.  Thanks

## 2018-05-20 NOTE — Telephone Encounter (Signed)
May 20, 2018   Michele [name] [address] [place] [address]      Dr. Halford Chessman,   The above named patient has a long standing history of SOB. She now complains of   new left sided chest pains and chest discomfort. Are there any concerns from a  pulmonary stand point for her to have an EGD with sedation.     [name], NP

## 2018-05-20 NOTE — Telephone Encounter (Signed)
-----   Message from Milus Banister, MD sent at 05/20/2018 12:51 PM EST -----   ----- Message ----- From: Willia Craze, NP Sent: 05/20/2018  12:20 PM EST To: Milus Banister, MD
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
## 2018-05-20 NOTE — Telephone Encounter (Signed)
Patient informed and verbalized understanding. New rx sent to pharmacy.

## 2018-05-20 NOTE — Telephone Encounter (Signed)
The pt does not wish to schedule at this time.  She wants to wait until after response from cardiology and pulmonology has ok'd the procedure. Per Paula's note;   ADDENDUM: Just heard back from Dr. Halford Chessman and Pulmonary.  She is okay for an EGD from his standpoint but recommends it be done at the hospital.  Still waiting to hear back from Cardiology  The pt will be contacted when anti coag clearance received to schedule if appropriate.    Jenness Corner just wanted you to be aware of where we stand with her.

## 2018-05-20 NOTE — Telephone Encounter (Signed)
She can have EGD but this should be done in hospital setting and not outpatient endoscopy center.

## 2018-05-20 NOTE — Telephone Encounter (Signed)
May 20, 2018   Michele [name] [address] [place] [address]      Dr. [name]   The above named patient has a long standing history of SOB. She now complains of  new left sided chest pains and chest discomfort. Are there any concerns from a  Cardiology stand point for her to have an EGD with sedation. Would she be cleared to hold her Plavix?   [name], NP

## 2018-05-20 NOTE — Telephone Encounter (Signed)
Follow Up:       Pt returning Julie's call from yesterday, concerning her lab results. She said you can call in about 2 hrs, she has a doctor appointment this morning.[name]

## 2018-05-21 ENCOUNTER — Encounter (HOSPITAL_COMMUNITY): Payer: Self-pay

## 2018-05-21 NOTE — Telephone Encounter (Signed)
She is OK to hold Plavix. OK for EGD.  Glenetta Hew, MD

## 2018-05-21 NOTE — Telephone Encounter (Signed)
Michele [name],  Cardiology has given clearance for EGD and to hold Plavix. Pulmonary wants it done at hospital though. When you return to work will you schedule the EGD for evaluation of dysphagia and also abnormal esophagus on chest CTA. 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 Thanks

## 2018-05-22 NOTE — Telephone Encounter (Signed)
Agree. Thanks for the helping on this Lankin.  I will send this to Patty as well to coordinate [facility] EGD with MAC sedation.  Patty, See above.  Thanks

## 2018-05-22 NOTE — Telephone Encounter (Signed)
Peter Congo get with me and I will let you know which dates are open.

## 2018-06-01 IMAGING — NM NM MISC PROCEDURE
6 series · 36 of 36 positions shown · non-contrast
Comparison: none

[Series 1: wbr_r-proj_st wbr rest · 6.40mm/px · 6 of 64 frames shown]
[frame 6/64]
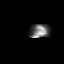
[frame 16/64]
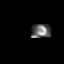
[frame 27/64]
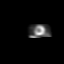
[frame 38/64]
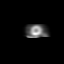
[frame 48/64]
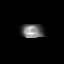
[frame 59/64]
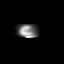

[Series 1: wbr rest · 6.40mm/px · 6 of 64 frames shown]
[frame 6/64]
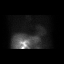
[frame 16/64]
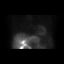
[frame 27/64]
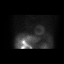
[frame 38/64]
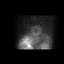
[frame 48/64]
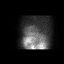
[frame 59/64]
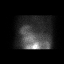

[Series 2: wbr stress-gsp · 6.40mm/px · 6 of 512 frames shown]
[frame 43/512]
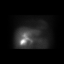
[frame 128/512]
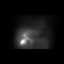
[frame 214/512]
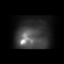
[frame 299/512]
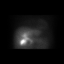
[frame 384/512]
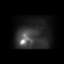
[frame 470/512]
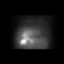

[Series 2: wbr_s-proj_st wbr stress-gsp · 6.40mm/px · 6 of 512 frames shown]
[frame 43/512]
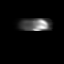
[frame 128/512]
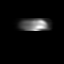
[frame 214/512]
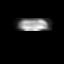
[frame 299/512]
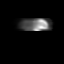
[frame 384/512]
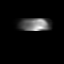
[frame 470/512]
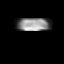

[Series 3: wbr_s-proj_st wbr stress-sum-em · 6.40mm/px · 6 of 64 frames shown]
[frame 6/64]
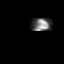
[frame 16/64]
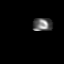
[frame 27/64]
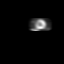
[frame 38/64]
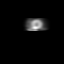
[frame 48/64]
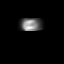
[frame 59/64]
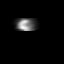

[Series 3: wbr stress-sum-em · 6.40mm/px · 6 of 64 frames shown]
[frame 6/64]
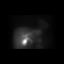
[frame 16/64]
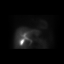
[frame 27/64]
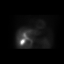
[frame 38/64]
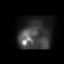
[frame 48/64]
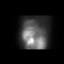
[frame 59/64]
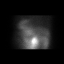

[36 of 36 positions shown; findings below may reference images not displayed]

Canned report from images found in remote index.

Refer to host system for actual result text.

## 2018-06-01 NOTE — Progress Notes (Signed)
Continue on current medication is fine. Light activity whenever she can tolerate (patient has been weak). She is already quite cachectic, therefore I did not make any dietary recommendations

## 2018-06-06 IMAGING — CR DG CHEST 2V
2 series · 2 of 2 positions shown · non-contrast
Comparison: 09/26/2015, 09/26/2015, 07/12/2015, 03/03/2015,
11/04/2014, 12/18/2013, 08/29/2012.

CLINICAL DATA: Weight loss.

EXAM:
CHEST  2 VIEW

[w chest pa]
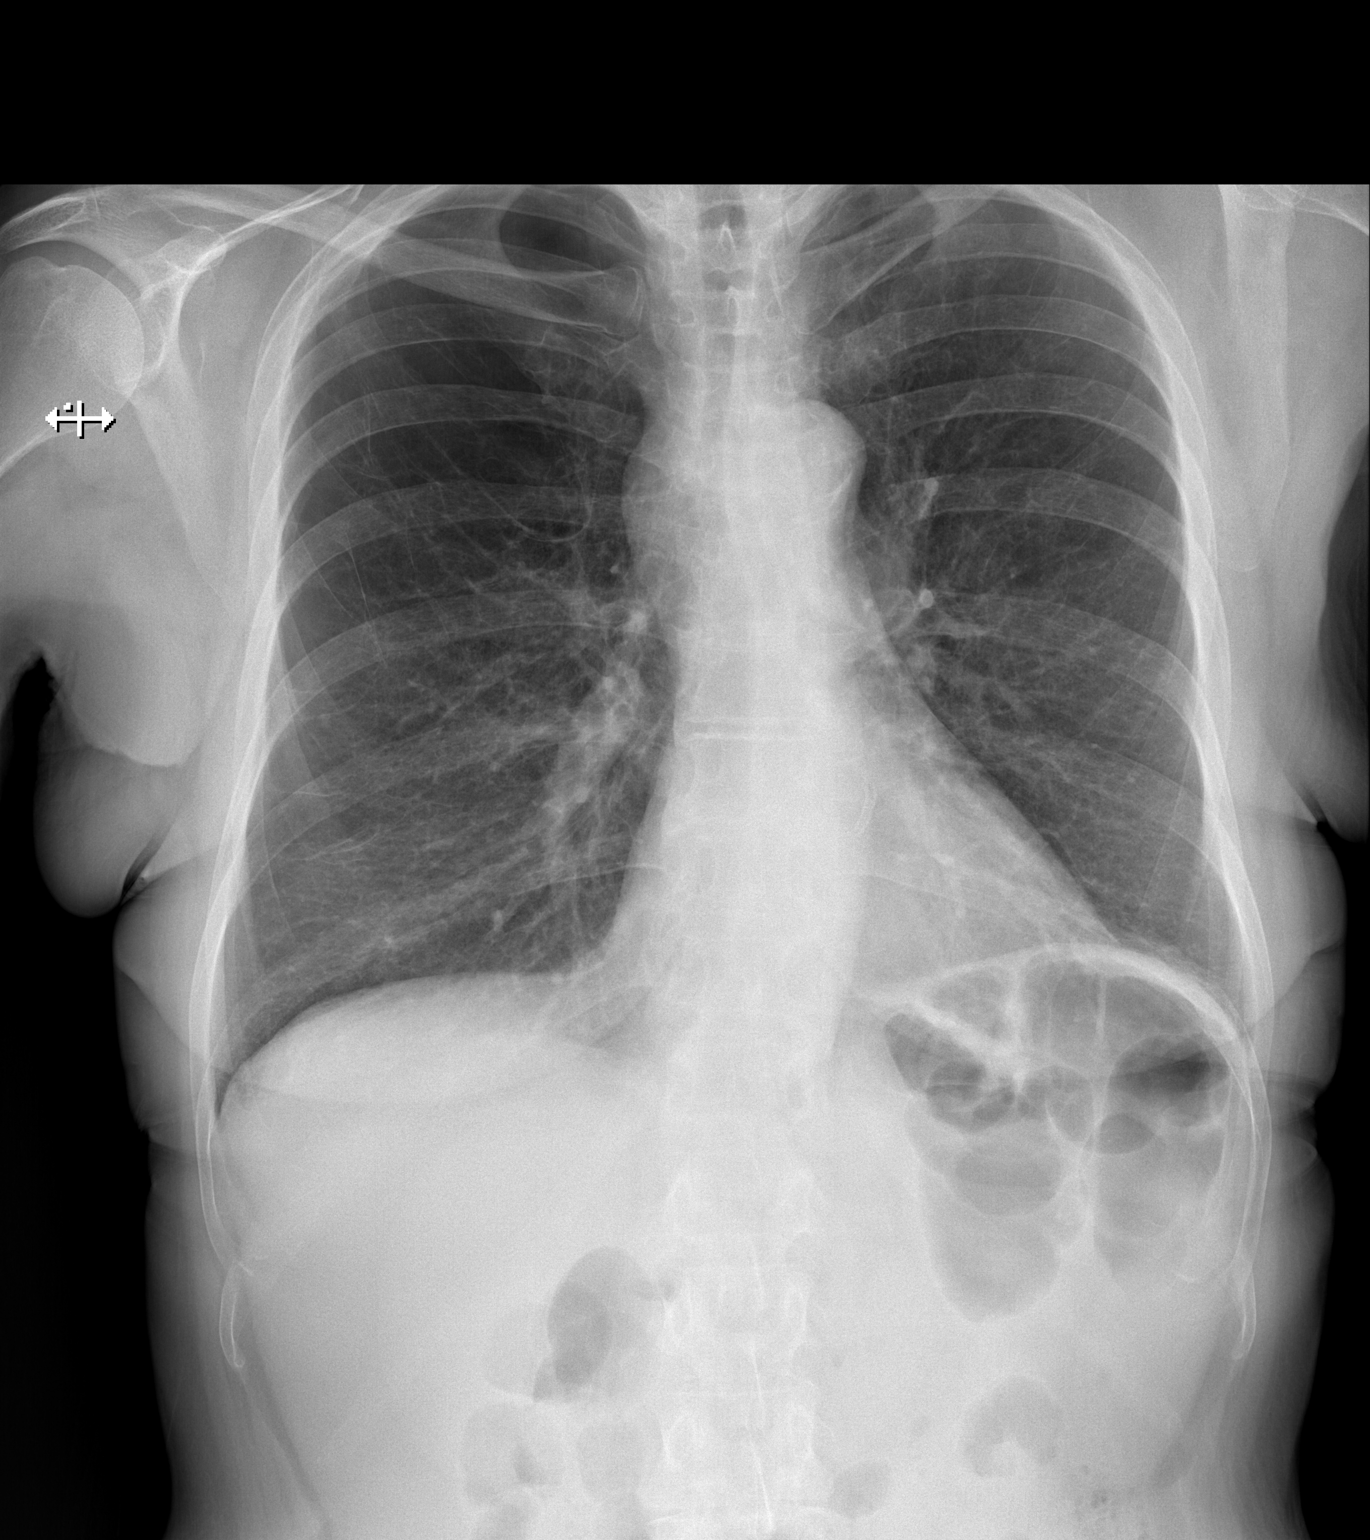

[w chest lat]
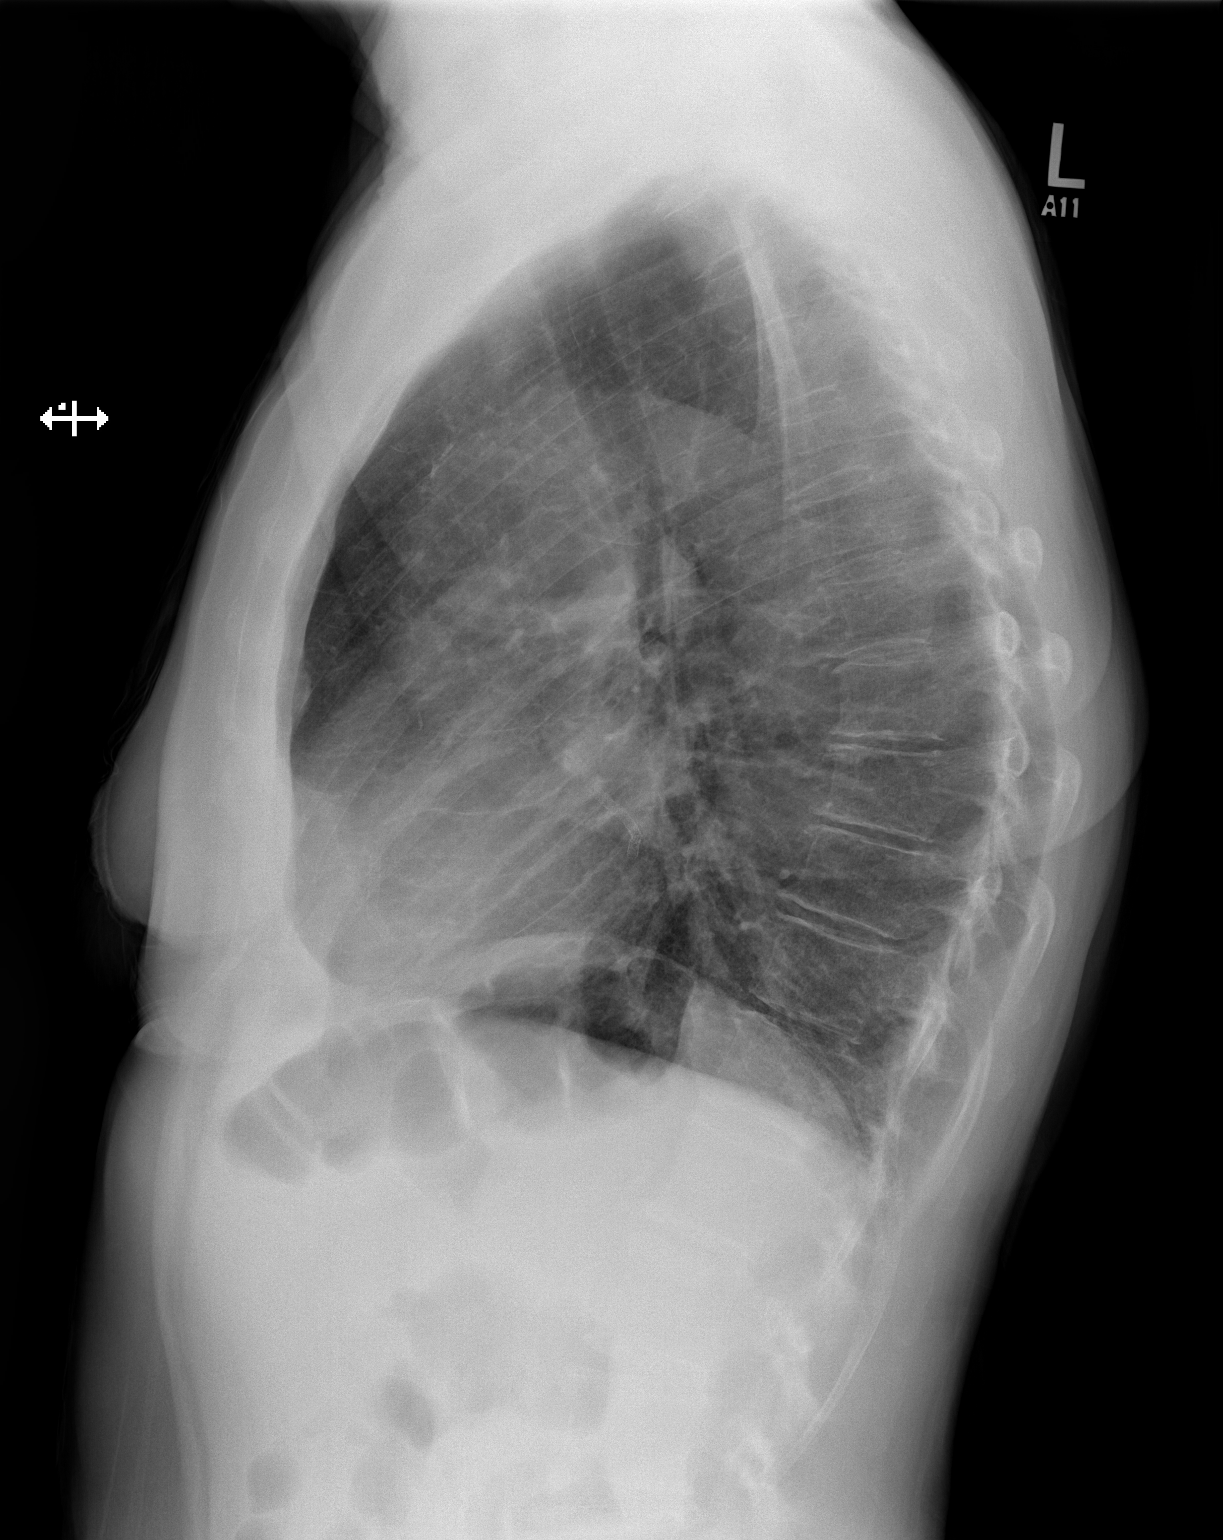

[2 of 2 positions shown; findings below may reference images not displayed]

FINDINGS: Mild prominence of the mediastinum is noted, possibly prominent
ascending aorta and/ or innominate artery. Mild aneurysmal
dilatation of the ascending aorta/innominate artery cannot be
excluded. IV contrast-enhanced chest CT can be obtained for further
evaluation. Lungs are clear of acute infiltrates. Bullous COPD
IMPRESSION: 1. Mild aneurysmal dilatation of the ascending aorta and/or
innominate artery cannot be excluded. IV contrast-enhanced chest CT
can be obtained if further evaluate.

2. Bullous COPD.

## 2018-06-08 IMAGING — CT CT CHEST W/ CM
2 of 4 series · 15 of 36 positions shown, 18 images · IV contrast (APPLIED)
Comparison: Chest x-ray 02/07/2016

CLINICAL DATA: Throat mass

EXAM:
CT CHEST WITH CONTRAST
TECHNIQUE: Multidetector CT imaging of the chest was performed during
intravenous contrast administration.
CONTRAST:  75 cc Isovue high

[Series 2: chest w/cm · axial · 0.62mm/px · z∈[-390,-100]mm · 12 of 169 slices shown, 15 images]
[im 12/169  mediastinal]
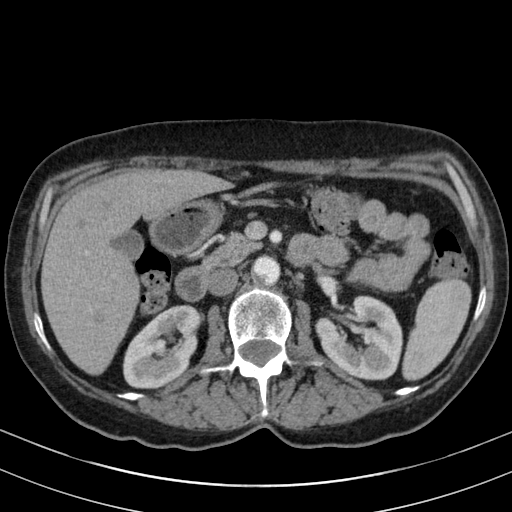
[im 12/169  lung]
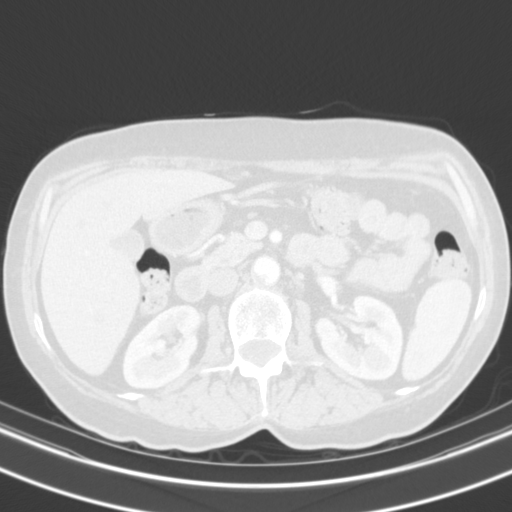
[im 23/169  lung]
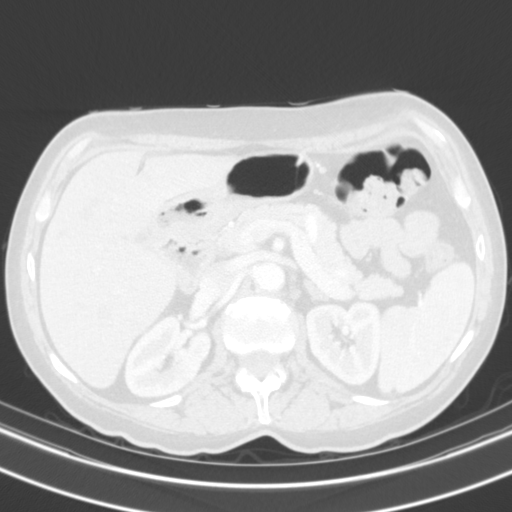
[im 34/169  lung]
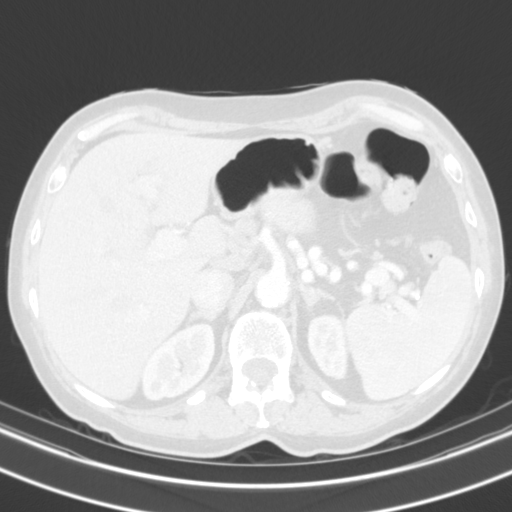
[im 57/169  lung]
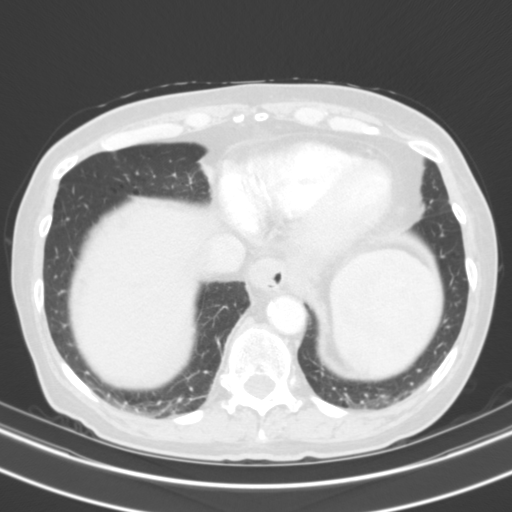
[im 68/169  mediastinal]
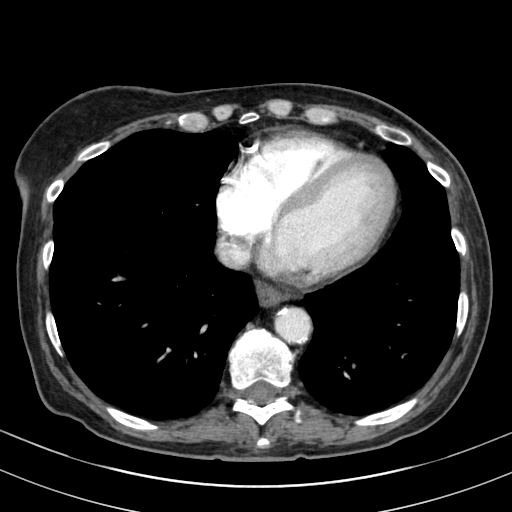
[im 68/169  lung]
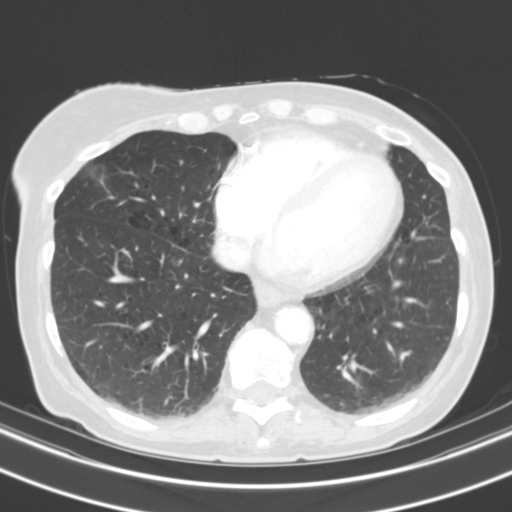
[im 79/169  lung]
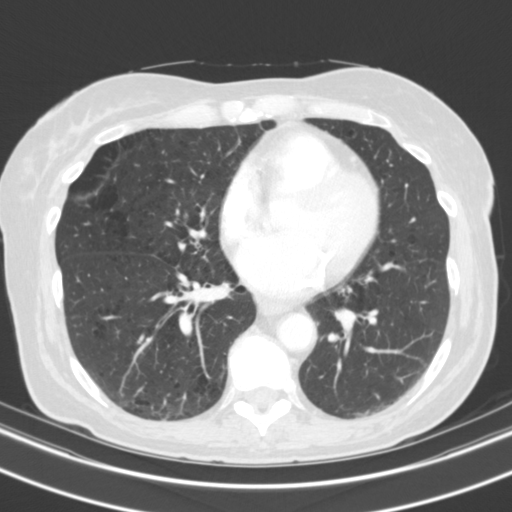
[im 90/169  lung]
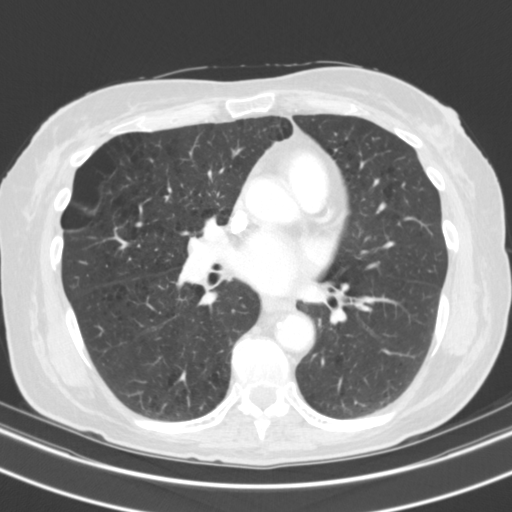
[im 101/169  lung]
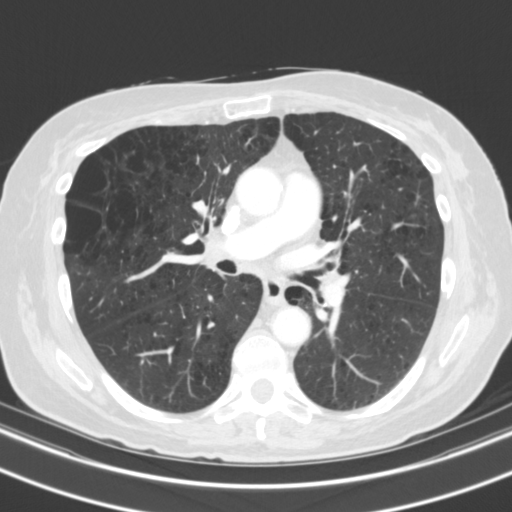
[im 113/169  mediastinal]
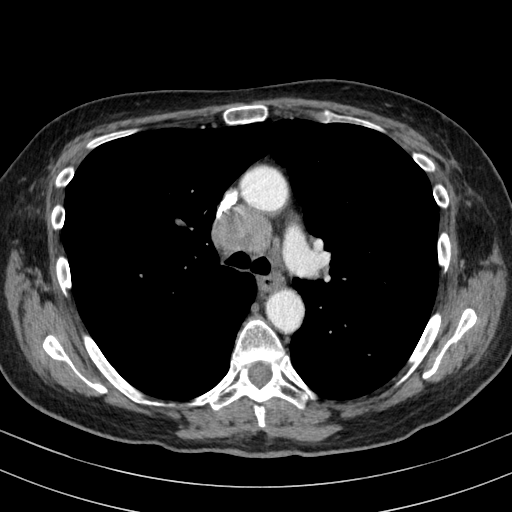
[im 113/169  lung]
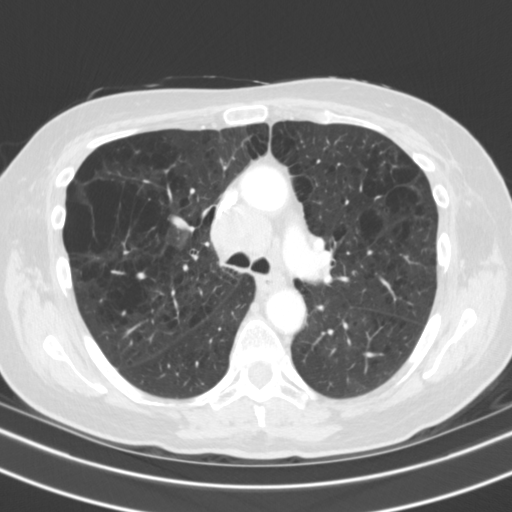
[im 135/169  lung]
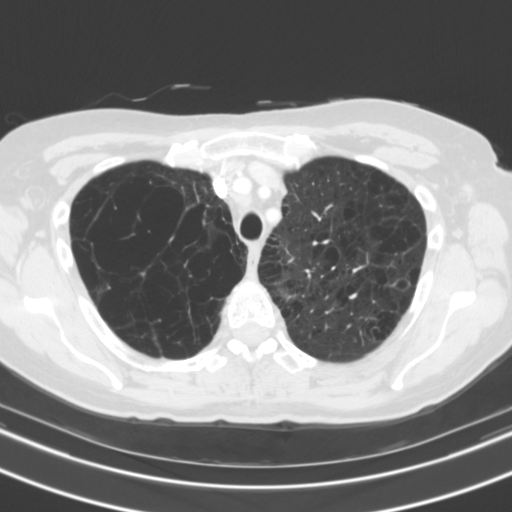
[im 146/169  lung]
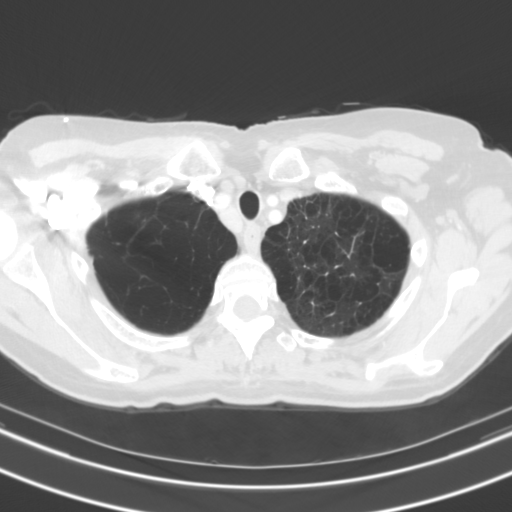
[im 157/169  lung]
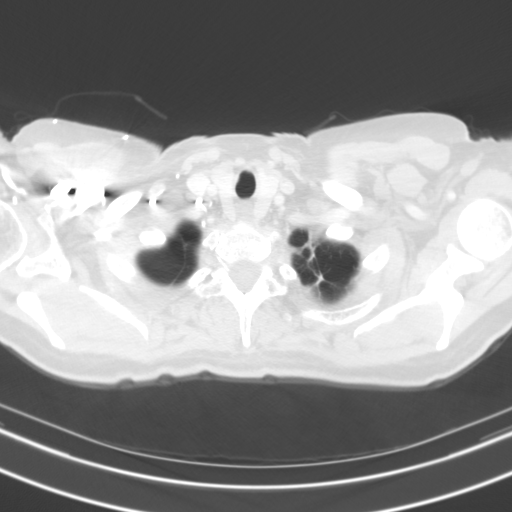

[Series 3: cor · coronal · 0.66mm/px · 3 of 122 slices shown]
[im 25/122  lung]
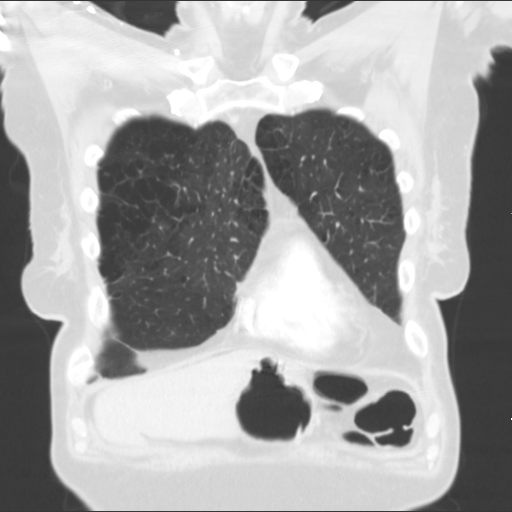
[im 49/122  lung]
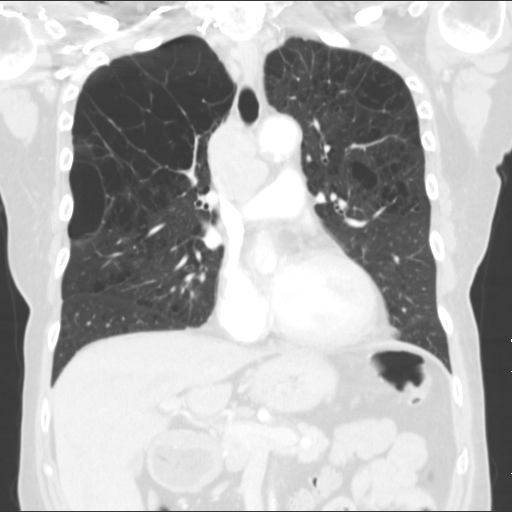
[im 73/122  lung]
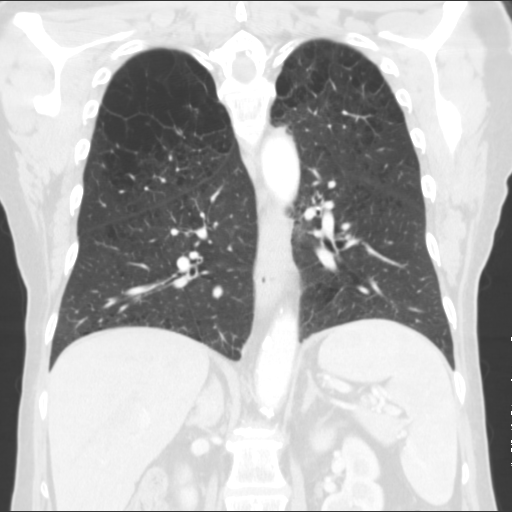

[15 of 36 positions shown; findings below may reference images not displayed]

FINDINGS: Cardiovascular: Atherosclerotic calcifications of thoracic aorta. No
aortic aneurysm. Heart size within normal limits. Atherosclerotic
calcifications of coronary arteries.

Mediastinum/Nodes: There is a precarinal pathologic lymph node with
necrotic center measures at least 3.6 by 2.9 cm. A second right
pretracheal lymph node Measures 2.3 cm in short-axis. These are
highly suspicious for lymphoproliferative disease or metastatic
disease. Correlation with PET scan is recommended. A AP window lymph
node measures 1.4 cm short-axis.

Lungs/Pleura: Images of the lung parenchyma shows bilateral
extensive emphysematous/ bullous changes. No acute infiltrate or
pulmonary edema. No pleural thickening. No pleural effusion.

Upper Abdomen: Small hiatal hernia is noted. The visualized upper
abdomen shows no adrenal gland mass. Visualized liver, spleen and
pancreas is unremarkable. The kidneys are symmetrical in size. No
hydronephrosis or hydroureter. No calcified gallstones are noted
within gallbladder.

Musculoskeletal: Sagittal images of the spine shows mild
degenerative of the the the the.
IMPRESSION: 1. There is precarinal and pretracheal pathologic adenopathy.
Primary or secondary malignancy cannot be excluded. Correlation with
PET scan and/or biopsy is recommended.
2. No acute infiltrate or pulmonary edema. Emphysematous changes are
noted bilaterally.
3. Degenerative changes thoracic spine.
4. No adrenal gland mass is noted.  Small hiatal hernia.

## 2018-06-11 ENCOUNTER — Telehealth: Payer: Self-pay | Admitting: Gastroenterology
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
## 2018-06-11 NOTE — Telephone Encounter (Signed)
Pt called to ask for an update whether she has clearance from cardiology for an EGD at [facility].

## 2018-06-11 NOTE — Telephone Encounter (Signed)
Michele [name] you were working on this I believe.  She saw Nevin Bloodgood in the office.  Let me know if you need me to do anything.

## 2018-06-12 ENCOUNTER — Encounter: Payer: Self-pay | Admitting: Pulmonary Disease

## 2018-06-12 ENCOUNTER — Ambulatory Visit (INDEPENDENT_AMBULATORY_CARE_PROVIDER_SITE_OTHER)
Admission: RE | Admit: 2018-06-12 | Discharge: 2018-06-12 | Disposition: A | Discharge status: Home / Self Care | Payer: Medicare Other | Source: Ambulatory Visit | Attending: Pulmonary Disease | Admitting: Pulmonary Disease

## 2018-06-12 ENCOUNTER — Ambulatory Visit (INDEPENDENT_AMBULATORY_CARE_PROVIDER_SITE_OTHER): Payer: Medicare Other | Admitting: Pulmonary Disease

## 2018-06-12 ENCOUNTER — Telehealth: Payer: Self-pay | Admitting: Pulmonary Disease

## 2018-06-12 ENCOUNTER — Other Ambulatory Visit: Payer: Self-pay | Admitting: Pulmonary Disease

## 2018-06-12 VITALS — BP 102/58 | HR 94 | Ht 66.0 in | Wt 120.6 lb

## 2018-06-12 DIAGNOSIS — J189 Pneumonia, unspecified organism: Secondary | ICD-10-CM

## 2018-06-12 DIAGNOSIS — J471 Bronchiectasis with (acute) exacerbation: Secondary | ICD-10-CM | POA: Diagnosis not present

## 2018-06-12 DIAGNOSIS — R05 Cough: Secondary | ICD-10-CM | POA: Diagnosis not present

## 2018-06-12 DIAGNOSIS — J479 Bronchiectasis, uncomplicated: Secondary | ICD-10-CM | POA: Insufficient documentation

## 2018-06-12 DIAGNOSIS — Z79899 Other long term (current) drug therapy: Secondary | ICD-10-CM | POA: Insufficient documentation

## 2018-06-12 DIAGNOSIS — J439 Emphysema, unspecified: Secondary | ICD-10-CM | POA: Diagnosis not present

## 2018-06-12 DIAGNOSIS — I208 Other forms of angina pectoris: Secondary | ICD-10-CM

## 2018-06-12 DIAGNOSIS — J449 Chronic obstructive pulmonary disease, unspecified: Secondary | ICD-10-CM

## 2018-06-12 DIAGNOSIS — Z720 Tobacco use: Secondary | ICD-10-CM | POA: Diagnosis not present

## 2018-06-12 MED ORDER — AMOXICILLIN-POT CLAVULANATE 875-125 MG PO TABS: 1.0000 | ORAL_TABLET | Freq: Two times a day (BID) | ORAL | 0 refills | Status: DC

## 2018-06-12 MED ORDER — FLUTICASONE PROPIONATE 50 MCG/ACT NA SUSP: 1.0000 | Freq: Every day | NASAL | 3 refills | Status: DC

## 2018-06-12 NOTE — Telephone Encounter (Signed)
Pt was made aware that [name] is in clinic and was fine for receiving the call after clinic was finished. She just wants to hear from [name] herself in regards to the report.

## 2018-06-12 NOTE — Telephone Encounter (Signed)
06/12/2018 1603  Attempted to contact the patient at patient's request to review chest x-ray results.  A female voice answered the phone and said that this was the wrong number and to take his off of the call list.  Attempt to call back and phone line was busy.  Wyn Quaker, FNP

## 2018-06-12 NOTE — Assessment & Plan Note (Signed)
Assessment: Patient continues to struggle with adherence with flutter valve use Patient continues to struggle with adherence to [place]: Emphasized the importance of taking Arnuity Ellipta as well as Spiriva HandiHaler daily Explained to patient how these medications work Showed patient what flutter valve was as well as showed patient how to use the flutter valve Patient reports she has 1 at home and knows where it is and that is clean and usable condition and she will start using it.  Explained the importance of using a flutter valve in the setting of bronchiectasis

## 2018-06-12 NOTE — Patient Instructions (Addendum)
Chest x-ray today >>> May consider antibiotic therapy after reviewing chest x-ray today  Continue Spiriva HandiHaler daily as prescribed Continue Arnuity Ellipta daily as prescribed  We will explain and show you how to use the flutter valve today.  Unfortunately we cannot demonstrate unless you purchase 1.  Bronchiectasis: This is the medical term which indicates that you have damage, dilated airways making you more susceptible to respiratory infection. Use a flutter valve 10 breaths twice a day or 4 to 5 breaths 4-5 times a day to help clear mucus out Let us know if you have cough with change in mucus color or fevers or chills.  At that point you would need an antibiotic. Maintain a healthy nutritious diet, eating whole foods Take your medications as prescribed   Please start taking a daily antihistamine:  >>>choose one of: zyrtec, claritin, allegra, or xyzal  >>>these are over the counter medications  >>>can choose generic option  >>>take daily  >>>this medication helps with allergies, post nasal drip, and cough    Can use Flonase 1 spray each nostril as needed for nasal congestion Can start daily nasal saline rinses  Continue to avoid known triggers to your breathing such as cigarette smoke Encourage anyone in your home to smoke outside and not in the car if you are with them  Follow-up in 2 to 4 weeks to monitor on medication adherence  You need to schedule a follow-up with cardiology as you are reporting intermittent chest pain and you have not seen them in a while  Continue follow-up with ENT next week regarding your right neck pain     It is flu season:   >>>Remember to be washing your hands regularly, using hand sanitizer, be careful to use around herself with has contact with people who are sick will increase her chances of getting sick yourself. >>> Best ways to protect herself from the flu: Receive the yearly flu vaccine, practice good hand hygiene washing with soap  and also using hand sanitizer when available, eat a nutritious meals, get adequate rest, hydrate appropriately   Please contact the office if your symptoms worsen or you have concerns that you are not improving.   Thank you for choosing [facility] for your healthcare, and for allowing [place] to partner with you on your healthcare journey. I am thankful to be able to provide care to you today.   Michele Quaker FNP-C   Chronic Obstructive Pulmonary Disease Chronic obstructive pulmonary disease (COPD) is a long-term (chronic) lung problem. When you have COPD, it is hard for air to get in and out of your lungs. Usually the condition gets worse over time, and your lungs will never return to normal. There are things you can do to keep yourself as healthy as possible.  Your doctor may treat your condition with: ? Medicines. ? Oxygen. ? Lung surgery.  Your doctor may also recommend: ? Rehabilitation. This includes steps to make your body work better. It may involve a team of specialists. ? Quitting smoking, if you smoke. ? Exercise and changes to your diet. ? Comfort measures (palliative care). Follow these instructions at home: Medicines  Take over-the-counter and prescription medicines only as told by your doctor.  Talk to your doctor before taking any cough or allergy medicines. You may need to avoid medicines that cause your lungs to be dry. Lifestyle  If you smoke, stop. Smoking makes the problem worse. If you need help quitting, ask your doctor.  Avoid being  around things that make your breathing worse. This may include smoke, chemicals, and fumes.  Stay active, but remember to rest as well.  Learn and use tips on how to relax.  Make sure you get enough sleep. Most adults need at least 7 hours of sleep every night.  Eat healthy foods. Eat smaller meals more often. Rest before meals. Controlled breathing Learn and use tips on how to control your breathing as told by your  doctor. Try:  Breathing in (inhaling) through your nose for 1 second. Then, pucker your lips and breath out (exhale) through your lips for 2 seconds.  Putting one hand on your belly (abdomen). Breathe in slowly through your nose for 1 second. Your hand on your belly should move out. Pucker your lips and breathe out slowly through your lips. Your hand on your belly should move in as you breathe out.  Controlled coughing Learn and use controlled coughing to clear mucus from your lungs. Follow these steps: 1. Lean your head a little forward. 2. Breathe in deeply. 3. Try to hold your breath for 3 seconds. 4. Keep your mouth slightly open while coughing 2 times. 5. Spit any mucus out into a tissue. 6. Rest and do the steps again 1 or 2 times as needed. General instructions  Make sure you get all the shots (vaccines) that your doctor recommends. Ask your doctor about a flu shot and a pneumonia shot.  Use oxygen therapy and pulmonary rehabilitation if told by your doctor. If you need home oxygen therapy, ask your doctor if you should buy a tool to measure your oxygen level (oximeter).  Make a COPD action plan with your doctor. This helps you to know what to do if you feel worse than usual.  Manage any other conditions you have as told by your doctor.  Avoid going outside when it is very hot, cold, or humid.  Avoid people who have a sickness you can catch (contagious).  Keep all follow-up visits as told by your doctor. This is important. Contact a doctor if:  You cough up more mucus than usual.  There is a change in the color or thickness of the mucus.  It is harder to breathe than usual.  Your breathing is faster than usual.  You have trouble sleeping.  You need to use your medicines more often than usual.  You have trouble doing your normal activities such as getting dressed or walking around the house. Get help right away if:  You have shortness of breath while  resting.  You have shortness of breath that stops you from: ? Being able to talk. ? Doing normal activities.  Your chest hurts for longer than 5 minutes.  Your skin color is more blue than usual.  Your pulse oximeter shows that you have low oxygen for longer than 5 minutes.  You have a fever.  You feel too tired to breathe normally. Summary  Chronic obstructive pulmonary disease (COPD) is a long-term lung problem.  The way your lungs work will never return to normal. Usually the condition gets worse over time. There are things you can do to keep yourself as healthy as possible.  Take over-the-counter and prescription medicines only as told by your doctor.  If you smoke, stop. Smoking makes the problem worse. This information is not intended to replace advice given to you by your health care provider. Make sure you discuss any questions you have with your health care provider. Document Released: 11/06/2007 Document  Revised: 06/24/2016 Document Reviewed: 06/24/2016 Elsevier Interactive Patient Education  2019 [employer].             Bronchiectasis  Bronchiectasis is a condition in which the airways in the lungs (bronchi) are damaged and widened. The condition makes it hard for the lungs to get rid of mucus, and it causes mucus to gather in the bronchi. This condition often leads to lung infections, which can make the condition worse. What are the causes? You can be born with this condition or you can develop it later in life. Common causes of this condition include:  Cystic fibrosis.  Repeated lung infections, such as pneumonia or tuberculosis.  An object or other blockage in the lungs.  Breathing in fluid, food, or other objects (aspiration).  A problem with the immune system and lung structure that is present at birth (congenital). Sometimes the cause is not known. What are the signs or symptoms? Common symptoms of this condition include:  A daily cough  that brings up mucus and lasts for more than 3 weeks.  Lung infections that happen often.  Shortness of breath and wheezing.  Weakness and fatigue. How is this diagnosed? This condition is diagnosed with tests, such as:  Chest X-rays or CT scans. These are done to check for changes in the lungs.  Breathing tests. These are done to check how well your lungs are working.  A test of a sample of your saliva (sputum culture). This test is done to check for infection.  Blood tests and other tests. These are done to check for related diseases or causes. How is this treated? Treatment for this condition depends on the severity of the illness and its cause. Treatment may include:  Medicines that loosen mucus so it can be coughed up (expectorants).  Medicines that relax the muscles of the bronchi (bronchodilators).  Antibiotic medicines to prevent or treat infection.  Physical therapy to help clear mucus from the lungs. Techniques may include: ? Postural drainage. This is when you sit or lie in certain positions so that mucus can drain by gravity. ? Chest percussion. This involves tapping the chest or back with a cupped hand. ? Chest vibration. For this therapy, a hand or special equipment vibrates your chest and back.  Surgery to remove the affected part of the lung. This may be done in severe cases. Follow these instructions at home: Medicines  Take over-the-counter and prescription medicines only as told by your health care provider.  If you were prescribed an antibiotic medicine, take it as told by your health care provider. Do not stop taking the antibiotic even if you start to feel better.  Avoid taking sedatives and antihistamines unless your health care provider tells you to take them. These medicines tend to thicken the mucus in the lungs. Managing symptoms  Perform breathing exercises or techniques to clear your lungs as told by your health care provider.  Consider using a  cold steam vaporizer or humidifier in your room or home to help loosen secretions.  If you have a cough that gets worse at night, try sleeping in a semi-upright position. General instructions  Get plenty of rest.  Drink enough fluid to keep your urine clear or pale yellow.  Stay inside when pollution and ozone levels are high.  Stay up to date with vaccinations and immunizations.  Avoid cigarette smoke and other lung irritants.  Do not use any products that contain nicotine or tobacco, such as cigarettes and e-cigarettes.  If you need help quitting, ask your health care provider.  Keep all follow-up visits as told by your health care provider. This is important. Contact a health care provider if:  You cough up more sputum than before and the sputum is yellow or green in color.  You have a fever.  You cannot control your cough and are losing sleep. Get help right away if:  You cough up blood.  You have chest pain.  You have increasing shortness of breath.  You have pain that gets worse or is not controlled with medicines.  You have a fever and your symptoms suddenly get worse. Summary  Bronchiectasis is a condition in which the airways in the lungs (bronchi) are damaged and widened. The condition makes it hard for the lungs to get rid of mucus, and it causes mucus to gather in the bronchi.  Treatment usually includes therapy to help clear mucus from the lungs.  Stay up to date with vaccinations and immunizations. This information is not intended to replace advice given to you by your health care provider. Make sure you discuss any questions you have with your health care provider. Document Released: 03/17/2007 Document Revised: 06/24/2016 Document Reviewed: 06/24/2016 Elsevier Interactive Patient Education  2019 [employer].

## 2018-06-12 NOTE — Progress Notes (Signed)
Chest x-ray showing mild infiltrate.  We will treat you with an antibiotic.  Augmentin >>> Take 1 875-125 mg tablet every 12 hours for the next 7 days >>> Take with food  Please place the order  Please make sure patient has a follow-up with our office in 4 weeks with a chest x-ray prior.  Please move the 2-week follow-up that she is scheduled in our office to 4 weeks with a chest x-ray.  Please go ahead and put a chest x-ray order and as stat.  Wyn Quaker, FNP

## 2018-06-12 NOTE — Assessment & Plan Note (Signed)
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 -Increased shortness of breath for the last 2 to 3 weeks per patient -Increased sputum production -No color change in sputum from baseline which is typically white thick sputum -Patient is not using her flutter valve -Patient feels increased fatigue -Patient having increased cough -Patient is a former smoker -60-pack-year smoking history, husband still smokes 1-1/2 packs/day -mMRC 3 today -Nonadherence to [place] -Adherence to Spiriva HandiHaler -Extremely noisy chest on exam today, rhonchi left greater than right in bases, expiratory wheezes  Plan: Chest x-ray today >>>Probable antibiotic therapy will be needed -Continue Spiriva HandiHaler daily -Continue Arnuity Ellipta daily -Discussed extensively with patient the need for her to make it a priority to take her inhalers as prescribed -Discussed with spouse as well as patient is need to smoke outside of the house and not in the car while the patient is riding with him -Resume flutter valve use as prescribed -Start daily antihistamine -Start Flonase 1 spray each nostril -Consider referral to lung cancer screening program for 2020 scan if patient receives no more CT imaging by November/2020

## 2018-06-12 NOTE — Telephone Encounter (Signed)
Number for pt is [phone]. I called the number to make sure it was the correct number and was able to speak with pt. Stated to her that [name] will contact her once clinic is finished. Pt expressed understanding.

## 2018-06-12 NOTE — Assessment & Plan Note (Signed)
Plan: Continue to not smoke Avoid being around other people who smoke such as your spouse who smokes 1-1/2 packs/day Encouraged her spouse to smoke outside and not in the car while you are driving with him May need to consider lung cancer screening program in the future if you do not need any other CT imaging prior to November/2020

## 2018-06-12 NOTE — Telephone Encounter (Signed)
06/12/2018 1704  Was able to contact the patient.  Explained to patient plan of care and need to take antibiotics.  Patient agrees.  Patient to get chest x-ray in 4 weeks.  Patient to contact our office if symptoms worsen or she is not improving.    Patient reports that she occasionally gets yeast infections with antibiotics.  Informed patient to contact our office if the symptoms occur and we can send in medication to treat this.  Wyn Quaker, FNP

## 2018-06-12 NOTE — Assessment & Plan Note (Signed)
Assessment: -Increased sputum production -Patient not adherent to flutter valve use -Patient is not currently using Mucinex -Increased fatigue  Plan: -Chest x-ray today -Resume flutter valve use -Instructed patient on how to use flutter valve extensively -Patient to start taking Mucinex DM -Start daily antihistamine -Flonase -Make sure the patient is hydrating appropriately -Ensure that you are eating nutritious meals -Follow-up with our office in 2 weeks

## 2018-06-12 NOTE — Assessment & Plan Note (Signed)
Plan: Follow-up with cardiology as you are reporting you are having stable angina

## 2018-06-12 NOTE — Progress Notes (Signed)
Patient seen in the office today and instructed on use of Flutter Valve.  Patient expressed understanding and demonstrated technique.

## 2018-06-12 NOTE — Telephone Encounter (Signed)
Called and spoke with pt who stated she received a notification on mychart that she might have pna in the left lung. Per pt, when she looked at the information in [place] in regards to the cxr, even after she had received the phone call from Ashley Akin, RN about the results, when she saw the actual results and saw that it stated malignancy, she became concerned.  I re-read the information stated by [name] to pt in regards to the results and stated to her the reason for the abx and for the f/u appt with cxr prior was to make sure the abx was doing its job of clearing up what was seen on the cxr. Stated to her if the f/u cxr showed that something was there, [name] would then look to see if another abx might need to be done or he would look to see if anything else needed to be done instead.  Again as soon as I tried to explain to pt, she kept saying the word malignancy that was seen in the results and stated due to her past cancer, with this word being mentioned, since this was not stated in the results by [name] or by Ander Purpura or myself when we read them to her, pt would like to have a call from [place] himself to go over the results of the cxr.  Pt can be reached at [phone]

## 2018-06-12 NOTE — Telephone Encounter (Signed)
Please contact the patient and let them know that I am more than happy to call them after clinic is over.  We are still actively seeing patients.  The purpose of the chest x-ray and following up is to ensure that the infiltrate resolves and clears.  If it does not resolve then we need to consider a work-up for other options 1 of those could be malignancy.  Wyn Quaker, FNP

## 2018-06-15 ENCOUNTER — Other Ambulatory Visit: Payer: Self-pay

## 2018-06-15 ENCOUNTER — Telehealth: Payer: Self-pay

## 2018-06-15 DIAGNOSIS — K229 Disease of esophagus, unspecified: Secondary | ICD-10-CM

## 2018-06-15 DIAGNOSIS — R1310 Dysphagia, unspecified: Secondary | ICD-10-CM

## 2018-06-15 IMAGING — CT NM PET TUM IMG INITIAL (PI) SKULL BASE T - THIGH
1 of 10 series · 1 of 25 positions shown · non-contrast
Comparison: CT chest dated 02/09/2016. CT abdomen pelvis dated
11/17/2015.

CLINICAL DATA: Initial treatment strategy for lung cancer.

EXAM:
NUCLEAR MEDICINE PET SKULL BASE TO THIGH
TECHNIQUE: 12.35 mCi F-18 FDG was injected intravenously. Full-ring PET imaging
was performed from the skull base to thigh after the radiotracer. CT
data was obtained and used for attenuation correction and anatomic
localization.
FASTING BLOOD GLUCOSE:  Value: 108 mg/dl

[Series 3: ct wb 5.0 b30f · axial · 5.0mm · 0.98mm/px · 1 of 290 slices shown]
[im 290/290  brain]
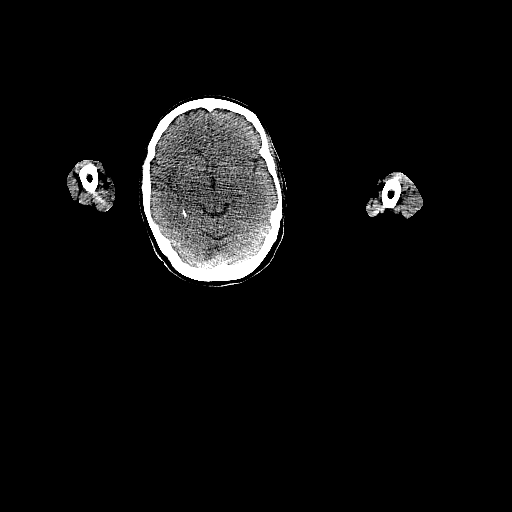

[1 of 25 positions shown; findings below may reference images not displayed]

FINDINGS: NECK

No hypermetabolic lymph nodes in the neck.

Hypermetabolism along the right base of tongue/vallecula, max SUV
6.3, likely related to recent biopsy/ procedure.

CHEST

Hypermetabolic thoracic lymphadenopathy, including:

--1.2 cm short axis high right paratracheal node (series 3/ image
68), max SUV

--2.7 cm short axis right paratracheal node (series 3/ image 82),
max SUV

--2.6 cm short axis low right paratracheal node (series 3/ image
88), max SUV 17.3)

Moderate centrilobular and paraseptal emphysematous changes, upper
lobe predominant. No suspicious pulmonary nodules. Mild dependent
atelectasis in the bilateral lower lobes. No pleural effusion or
pneumothorax.

The heart is normal in size. No pericardial effusion. Three vessel
coronary atherosclerosis. Mild atherosclerotic calcifications of the
aortic arch.

ABDOMEN/PELVIS

No abnormal hypermetabolic activity within the liver, pancreas,
adrenal glands, or spleen.

Atherosclerotic calcifications the abdominal aorta and branch
vessels.

No hypermetabolic lymph nodes in the abdomen or pelvis.

SKELETON

No focal hypermetabolic activity to suggest skeletal metastasis.
IMPRESSION: Hypermetabolic thoracic lymphadenopathy, corresponding to
biopsy-proven nodal metastases related to primary bronchogenic
neoplasm, as above.

## 2018-06-15 NOTE — Telephone Encounter (Signed)
Spoke with patient this morning regarding scheduling EGD at [facility] with Dr. Ardis Hughs.  I had a date for 2/13 20 at 7:30 but patient informed me that she has pneumonia and was advised by her pulmonologist to hold off on having procedure done. Patient states she will contact us after following up with Pulmonology.

## 2018-06-15 NOTE — Progress Notes (Signed)
Reviewed and agree with assessment/plan.   Tonisha Silvey, MD [facility] 05/29/2016, 12:24 PM Pager:  [phone]  

## 2018-06-16 NOTE — Telephone Encounter (Signed)
Ok. I will let Ardis Hughs know. Peter [place] please take her off schedule then. Thanks

## 2018-06-17 ENCOUNTER — Other Ambulatory Visit: Payer: Self-pay | Admitting: Nurse Practitioner

## 2018-06-17 DIAGNOSIS — H7203 Central perforation of tympanic membrane, bilateral: Secondary | ICD-10-CM | POA: Diagnosis not present

## 2018-06-17 DIAGNOSIS — H6123 Impacted cerumen, bilateral: Secondary | ICD-10-CM | POA: Diagnosis not present

## 2018-06-17 DIAGNOSIS — K219 Gastro-esophageal reflux disease without esophagitis: Secondary | ICD-10-CM | POA: Diagnosis not present

## 2018-06-17 DIAGNOSIS — H838X3 Other specified diseases of inner ear, bilateral: Secondary | ICD-10-CM | POA: Diagnosis not present

## 2018-06-17 DIAGNOSIS — H9011 Conductive hearing loss, unilateral, right ear, with unrestricted hearing on the contralateral side: Secondary | ICD-10-CM | POA: Diagnosis not present

## 2018-06-17 DIAGNOSIS — R070 Pain in throat: Secondary | ICD-10-CM | POA: Diagnosis not present

## 2018-06-17 NOTE — Telephone Encounter (Signed)
Erroneous encounter

## 2018-06-18 ENCOUNTER — Telehealth: Payer: Self-pay | Admitting: Pulmonary Disease

## 2018-06-18 NOTE — Telephone Encounter (Signed)
Spoke with the pt  She states for the past wk she has been having darker color to her urine  She is states it has been dark yellow all wk, and then this morning it was brown  She is asking if this is a side effect of the augmentin we put her on  I called [facility] and spoke with the pharmacist (Amy)  She states that this is not a common side effect, although if the pt is dehydrated it could cause the urine to look darker  She reviewed her med list and states that the linzess that she takes can cause dehydration   Spoke with the pt and notified her of this  She states she drinks plenty of water and is not taking linzess anymore  She states that she is also having some low back pain  I advised her to stay well hydrated and call PCP for further recs regarding her urine problem  She states she has a urologist and will call them  Nothing further needed

## 2018-06-19 ENCOUNTER — Other Ambulatory Visit: Payer: Self-pay | Admitting: Cardiology

## 2018-06-19 NOTE — Telephone Encounter (Signed)
Yes okay to refill with 3 additional refills. Thanks

## 2018-06-22 DIAGNOSIS — N3020 Other chronic cystitis without hematuria: Secondary | ICD-10-CM | POA: Diagnosis not present

## 2018-06-22 DIAGNOSIS — N393 Stress incontinence (female) (male): Secondary | ICD-10-CM | POA: Diagnosis not present

## 2018-06-22 DIAGNOSIS — M545 Low back pain: Secondary | ICD-10-CM | POA: Diagnosis not present

## 2018-06-23 NOTE — Telephone Encounter (Signed)
Yes, see previous request

## 2018-06-26 ENCOUNTER — Ambulatory Visit: Payer: Self-pay | Admitting: Pulmonary Disease

## 2018-06-29 IMAGING — MR MR HEAD WO/W CM
9 of 13 series · 34 of 48 positions shown · IV contrast (Yes)
Comparison: 12/31/2015 MRI of the brain

CLINICAL DATA: 59 y/o F; small cell lung cancer with 3 months of
pain, nausea, numbness, and weakness.

EXAM:
MRI HEAD WITHOUT AND WITH CONTRAST
TECHNIQUE: Multiplanar, multiecho pulse sequences of the brain and surrounding
structures were obtained without and with intravenous contrast.
CONTRAST:  11mL MULTIHANCE GADOBENATE DIMEGLUMINE 529 MG/ML IV SOLN

[Series 4: DWI · axial · 3.0mm · 1.09mm/px · z∈[-83,+64]mm · 9 of 100 slices shown (1 of 4)]
[im 1/100]
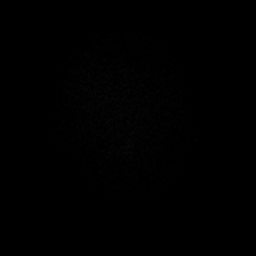
[im 13/100]
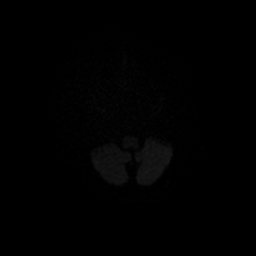
[im 25/100]
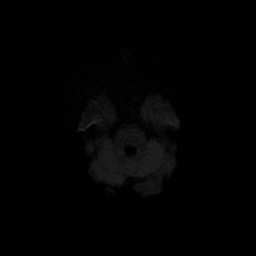
[im 38/100]
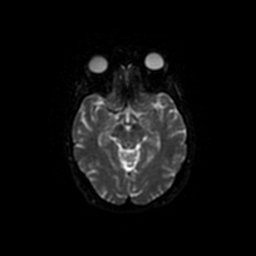
[im 50/100]
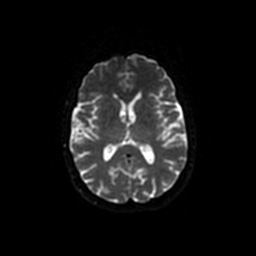
[im 62/100]
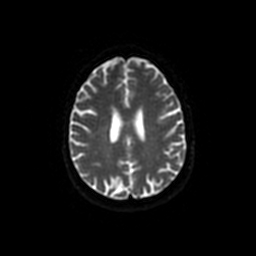
[im 75/100]
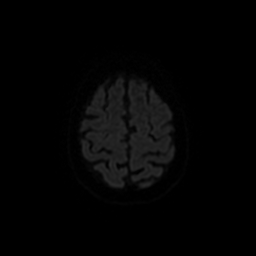
[im 87/100]
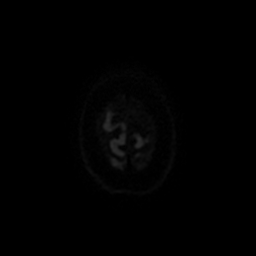
[im 100/100]
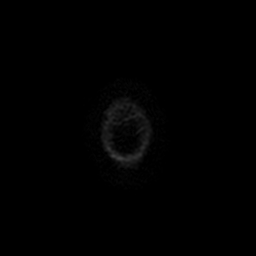

[Series 5: DWI · coronal · 5.0mm · 1.09mm/px · 6 of 76 slices shown (2 of 4)]
[im 1/76]
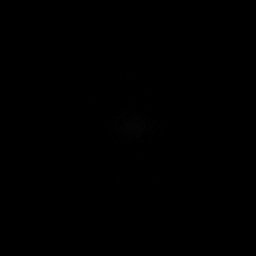
[im 16/76]
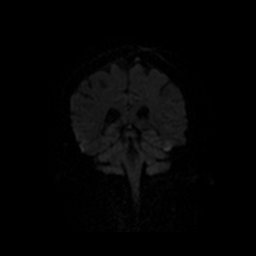
[im 31/76]
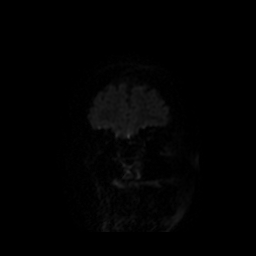
[im 46/76]
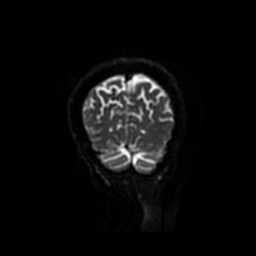
[im 61/76]
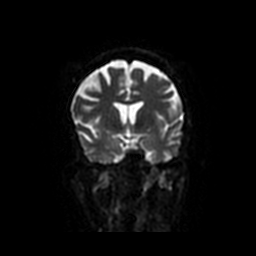
[im 76/76]
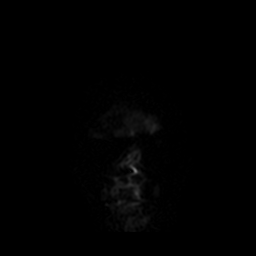

[Series 6: T2 · axial · 5.0mm · 0.43mm/px · z∈[-107,+55]mm · 2 of 26 slices shown]
[im 1/26]
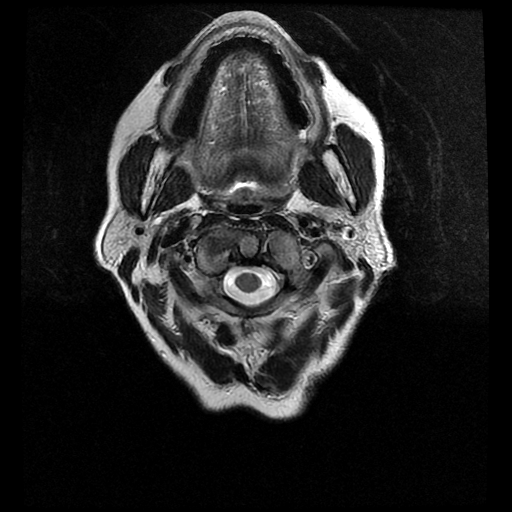
[im 26/26]
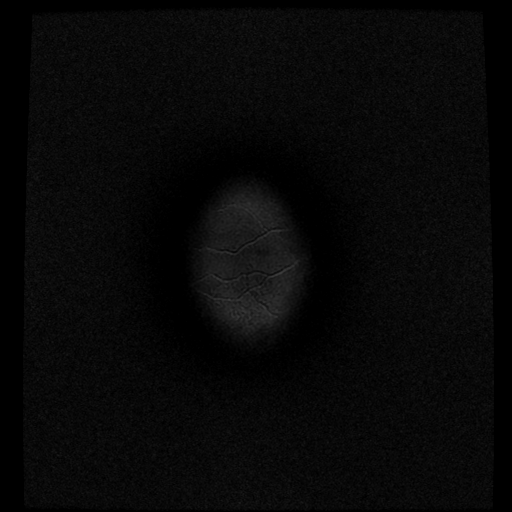

[Series 7: FLAIR · axial · 5.0mm · 0.43mm/px · z∈[-113,+61]mm · 2 of 26 slices shown]
[im 1/26]
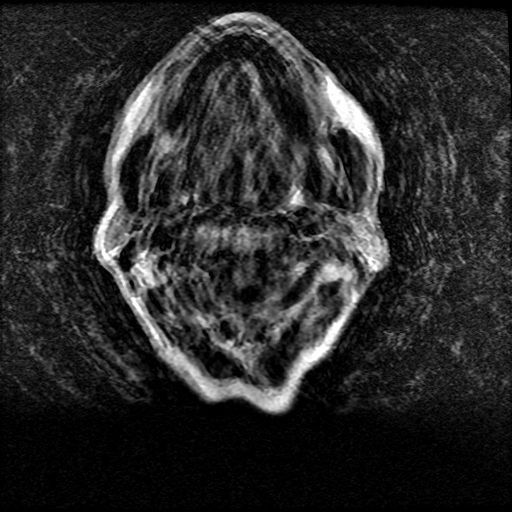
[im 26/26]
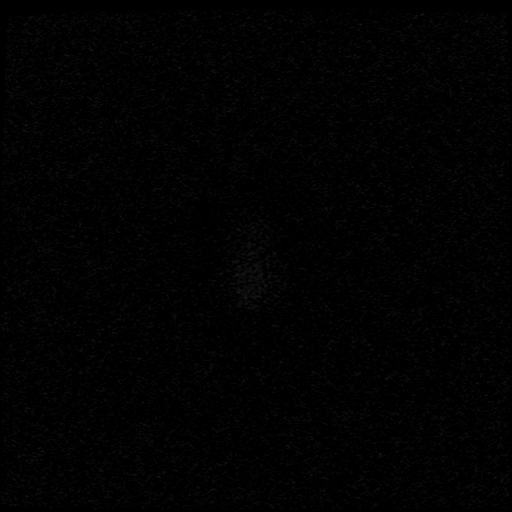

[Series 10: T2 post-contrast · coronal · 5.0mm · 0.45mm/px · 3 of 30 slices shown]
[im 1/30]
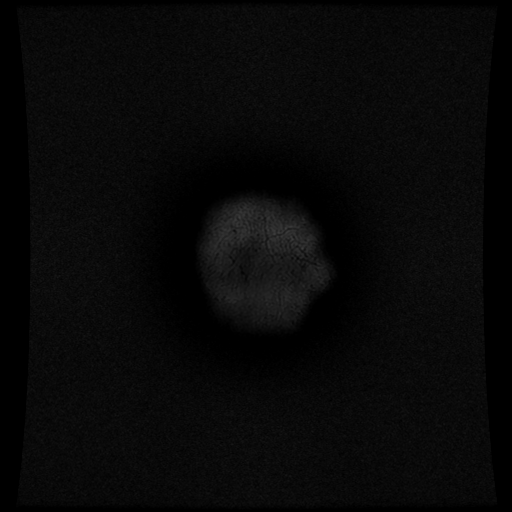
[im 15/30]
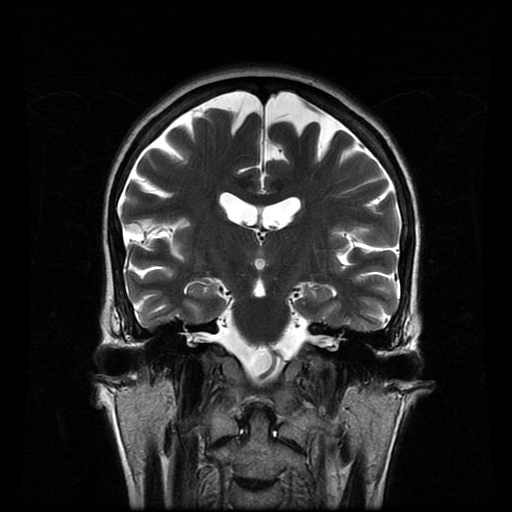
[im 30/30]
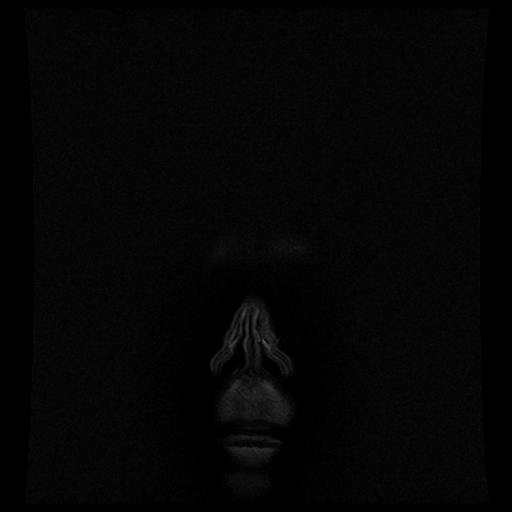

[Series 12: T1 post-contrast · coronal · 5.0mm · 0.45mm/px · 3 of 30 slices shown (1 of 2)]
[im 1/30]
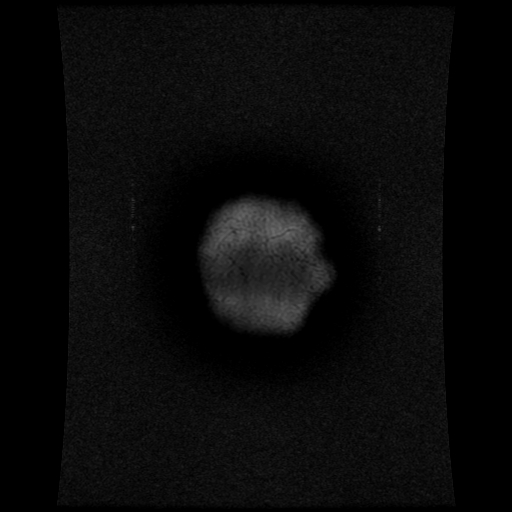
[im 15/30]
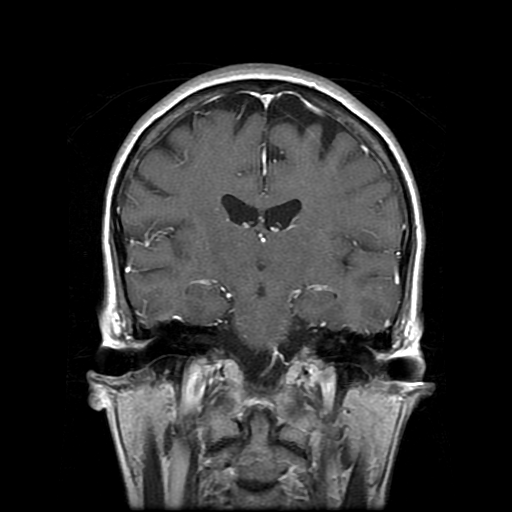
[im 30/30]
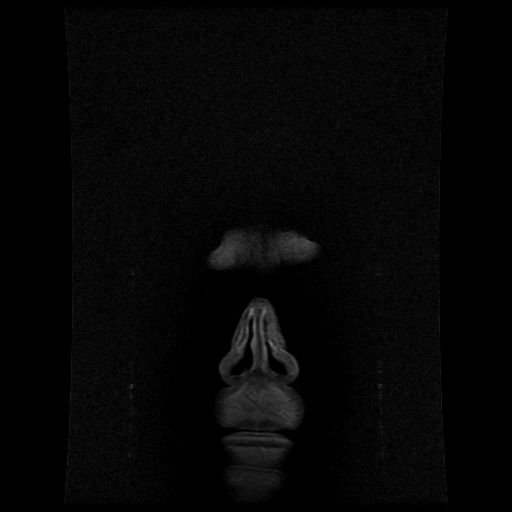

[Series 13: T1 post-contrast · sagittal · 5.0mm · 0.47mm/px · 2 of 27 slices shown (2 of 2)]
[im 1/27]
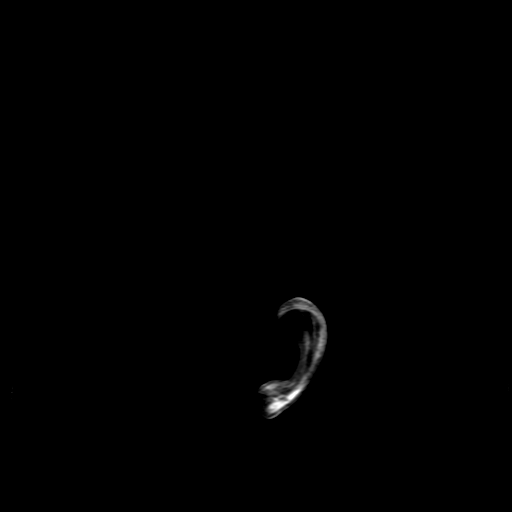
[im 27/27]
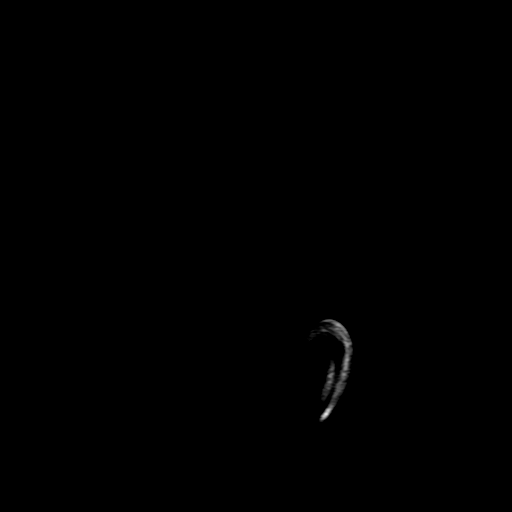

[Series 400: DWI · axial · 3.0mm · 1.09mm/px · z∈[-83,+64]mm · 4 of 50 slices shown (3 of 4)]
[im 1/50]
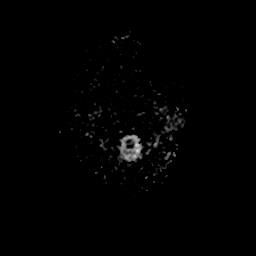
[im 17/50]
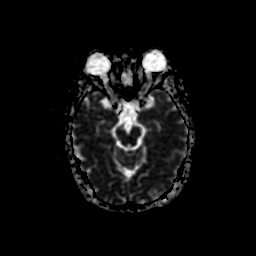
[im 33/50]
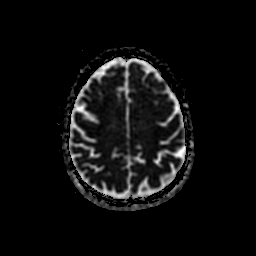
[im 50/50]
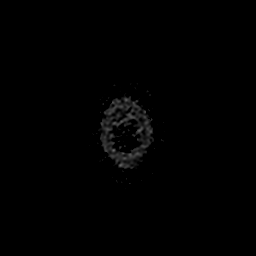

[Series 500: DWI · coronal · 5.0mm · 1.09mm/px · 3 of 37 slices shown (4 of 4)]
[im 1/37]
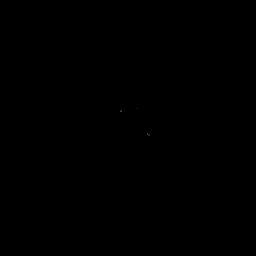
[im 19/37]
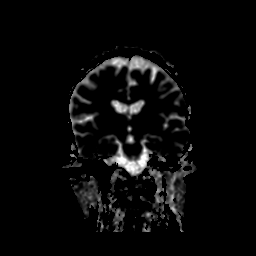
[im 37/37]
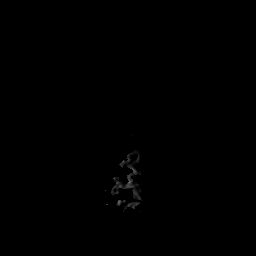

[34 of 48 positions shown; findings below may reference images not displayed]

FINDINGS: Brain: No acute infarction, hemorrhage, hydrocephalus, extra-axial
collection or mass lesion. No abnormal enhancement. Partially empty
sella again noted. No significant T2 FLAIR signal abnormality of the
brain. Mild stable parenchymal volume loss.

Vascular: Normal flow voids.

Skull and upper cervical spine: Normal marrow signal.

Sinuses/Orbits: Negative.

Other: None.
IMPRESSION: 1. No intracranial metastatic disease or acute process is
identified.
2. Stable nonspecific partially empty sella and mild parenchymal
volume loss.

By: Remmy Natasha M.D.

## 2018-07-04 ENCOUNTER — Other Ambulatory Visit: Payer: Self-pay | Admitting: Cardiology

## 2018-07-06 NOTE — Telephone Encounter (Signed)
Rx request sent to pharmacy.  

## 2018-07-07 IMAGING — CT CT ABD-PELV W/ CM
2 of 5 series · 15 of 46 positions shown, 17 images · IV contrast (ISOVUE)
Comparison: CT scan 11/17/2015 and PET-CT 02/16/2016

CLINICAL DATA: Two day history of upper abdominal pain. History of
lung cancer.

EXAM:
CT ABDOMEN AND PELVIS WITH CONTRAST
TECHNIQUE: Multidetector CT imaging of the abdomen and pelvis was performed
using the standard protocol following bolus administration of
intravenous contrast.
CONTRAST:  100mL RPRQ6S-LSS IOPAMIDOL (RPRQ6S-LSS) INJECTION 61%

[Series 2: abd/pel with · axial · 0.72mm/px · z∈[-374,+22]mm · 12 of 91 slices shown, 14 images]
[im 6/91  soft-tissue]
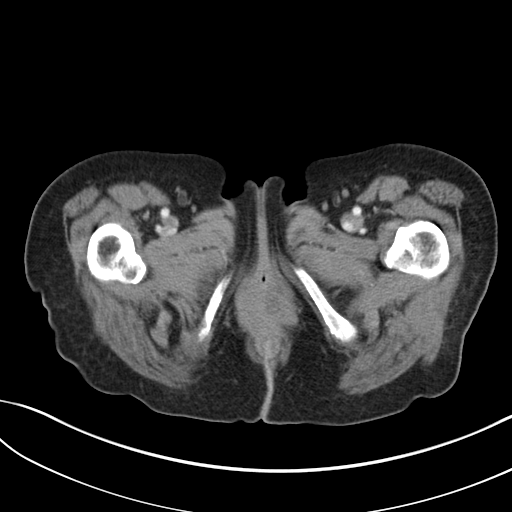
[im 6/91  bone]
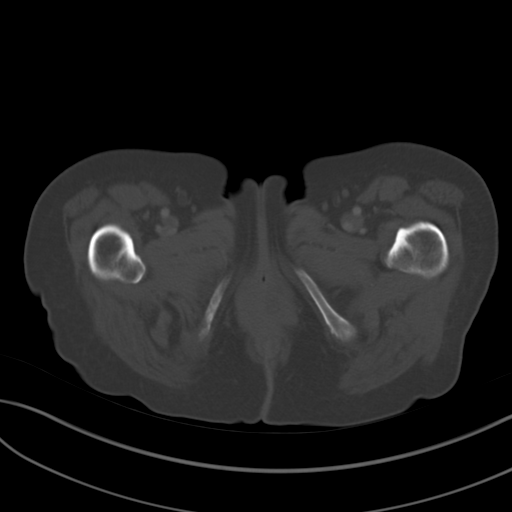
[im 12/91  soft-tissue]
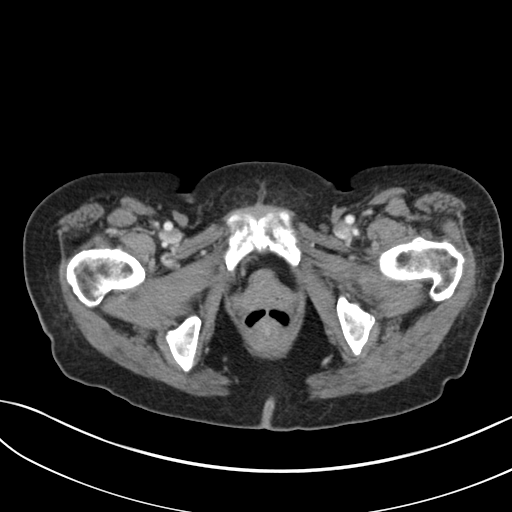
[im 23/91  soft-tissue]
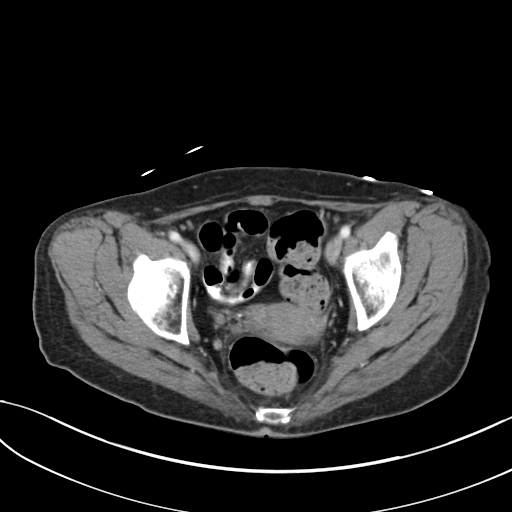
[im 29/91  soft-tissue]
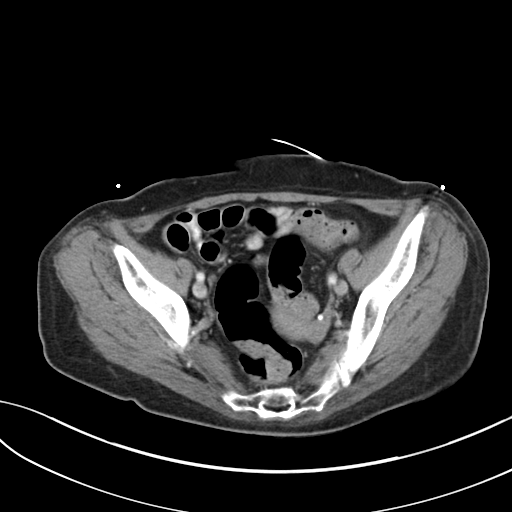
[im 34/91  soft-tissue]
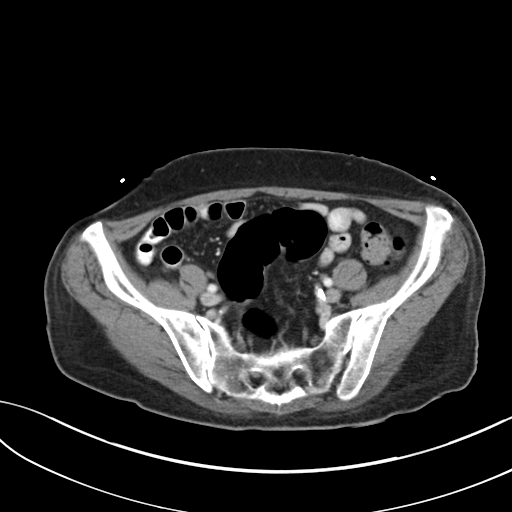
[im 40/91  soft-tissue]
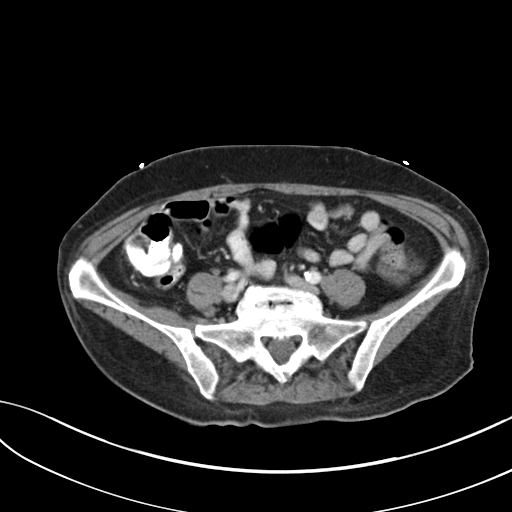
[im 51/91  soft-tissue]
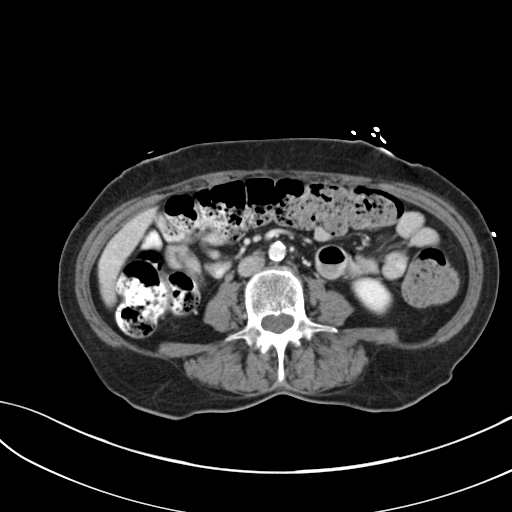
[im 57/91  soft-tissue]
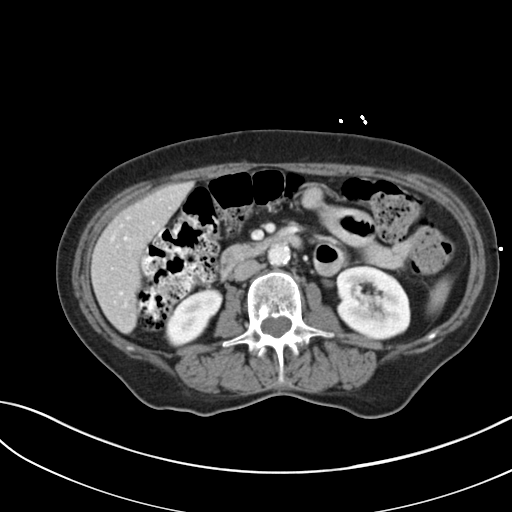
[im 62/91  soft-tissue]
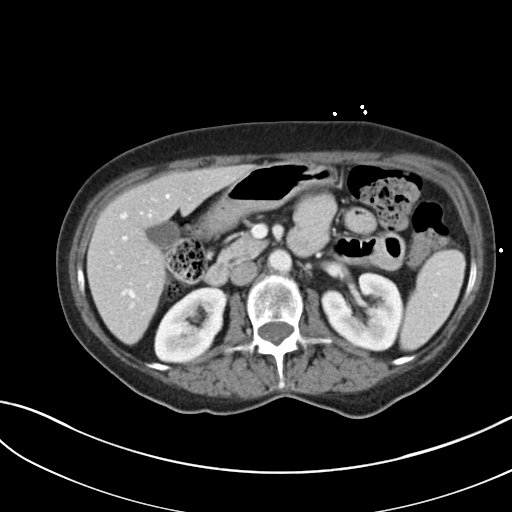
[im 62/91  bone]
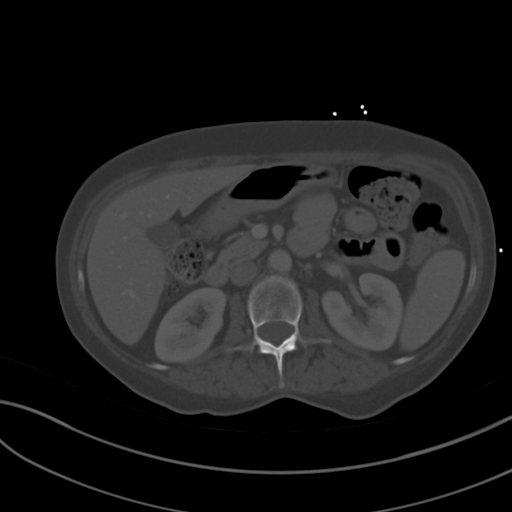
[im 68/91  soft-tissue]
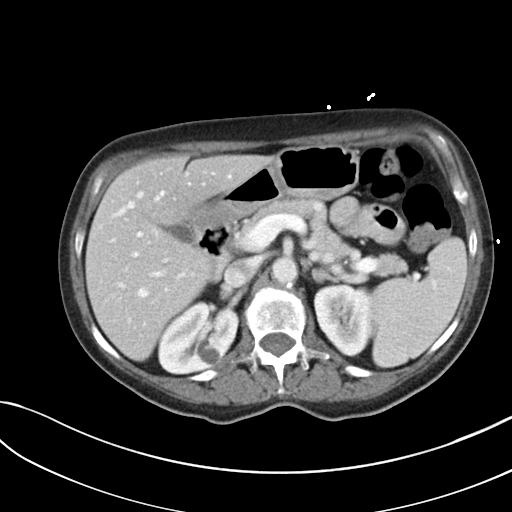
[im 79/91  soft-tissue]
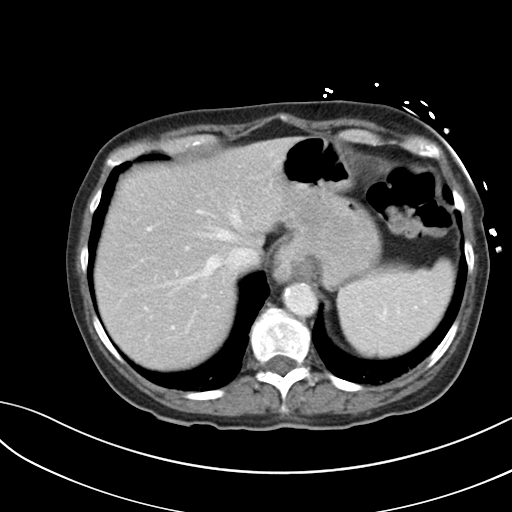
[im 85/91  soft-tissue]
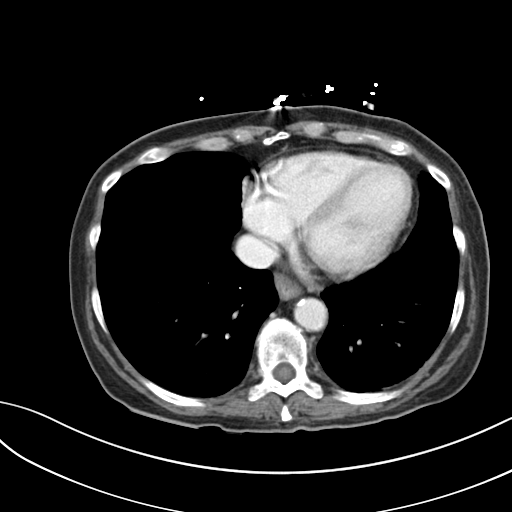

[Series 4: coronal a/|p · coronal · 0.66mm/px · 3 of 123 slices shown]
[im 41/123  soft-tissue]
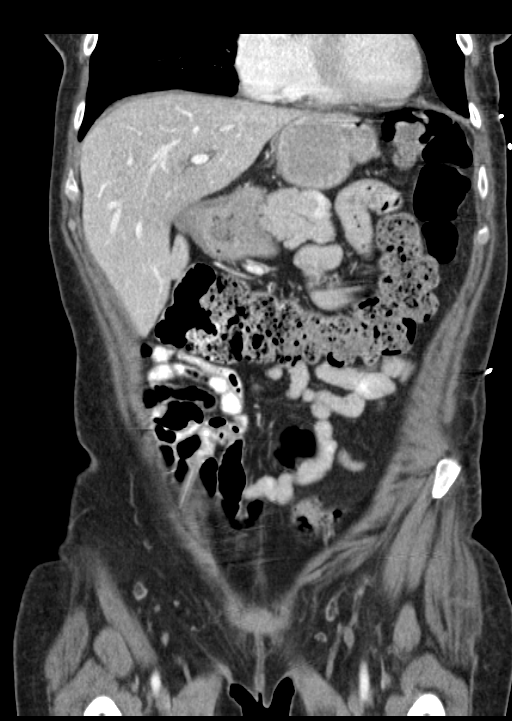
[im 55/123  soft-tissue]
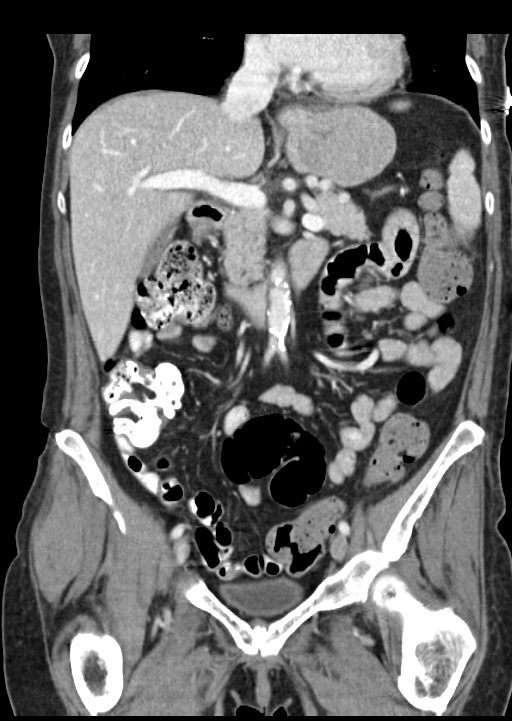
[im 68/123  soft-tissue]
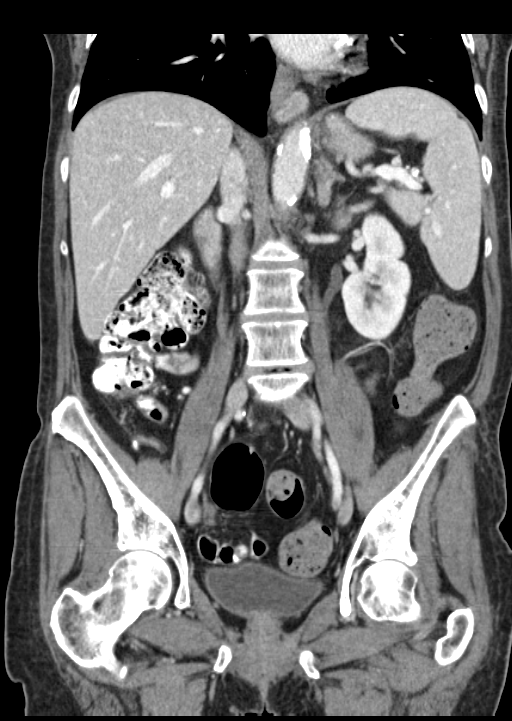

[15 of 46 positions shown; findings below may reference images not displayed]

FINDINGS: Lower chest: Stable emphysematous changes and peripheral pulmonary
scarring. No acute pulmonary findings or worrisome pulmonary
nodules. Stable three-vessel coronary artery calcifications. No
pericardial effusion. Moderate-sized hiatal hernia.

Hepatobiliary: No focal hepatic lesions to suggest metastatic
disease. The gallbladder is normal. No common bile duct dilatation.

Pancreas: No mass, inflammation or ductal dilatation.

Spleen: Normal size.  No focal lesions.

Adrenals/Urinary Tract: Stable nodularity of the left adrenal gland.
The right adrenal gland is normal. Stable small right renal cyst. No
worrisome renal lesions.

Stomach/Bowel: The stomach, duodenum, small bowel and colon are
unremarkable. No inflammatory changes, mass lesions or obstructive
findings. The terminal ileum is normal. The appendix is normal.

Vascular/Lymphatic: Stable atherosclerotic calcifications involving
the aorta and iliac arteries. The branch vessels are patent. Stable
area of focal adherent clot involving the infrarenal aorta.

No mesenteric or retroperitoneal mass or adenopathy.

Reproductive: The uterus and ovaries are normal.

Other: No pelvic mass or adenopathy. No free pelvic fluid
collections. No inguinal mass or adenopathy. No abdominal wall
hernia or subcutaneous lesions.

Musculoskeletal: No significant bony findings. Stable degenerative
changes involving the spine no worrisome bone lesions to suggest
metastatic disease.
IMPRESSION: 1. No acute abdominal/pelvic findings, mass lesions or adenopathy.
2. Stable age advanced atherosclerotic calcifications involving the
aorta and iliac arteries
3. Stable age advanced three-vessel coronary artery calcifications.
4. Stable emphysematous changes noted at the lung base but no
worrisome pulmonary nodules.

## 2018-07-07 IMAGING — CR DG CHEST 2V
2 series · 2 of 2 positions shown · non-contrast
Comparison: 02/14/2016

CLINICAL DATA: Weakness and history of lung carcinoma.

EXAM:
CHEST  2 VIEW

[w chest lat]
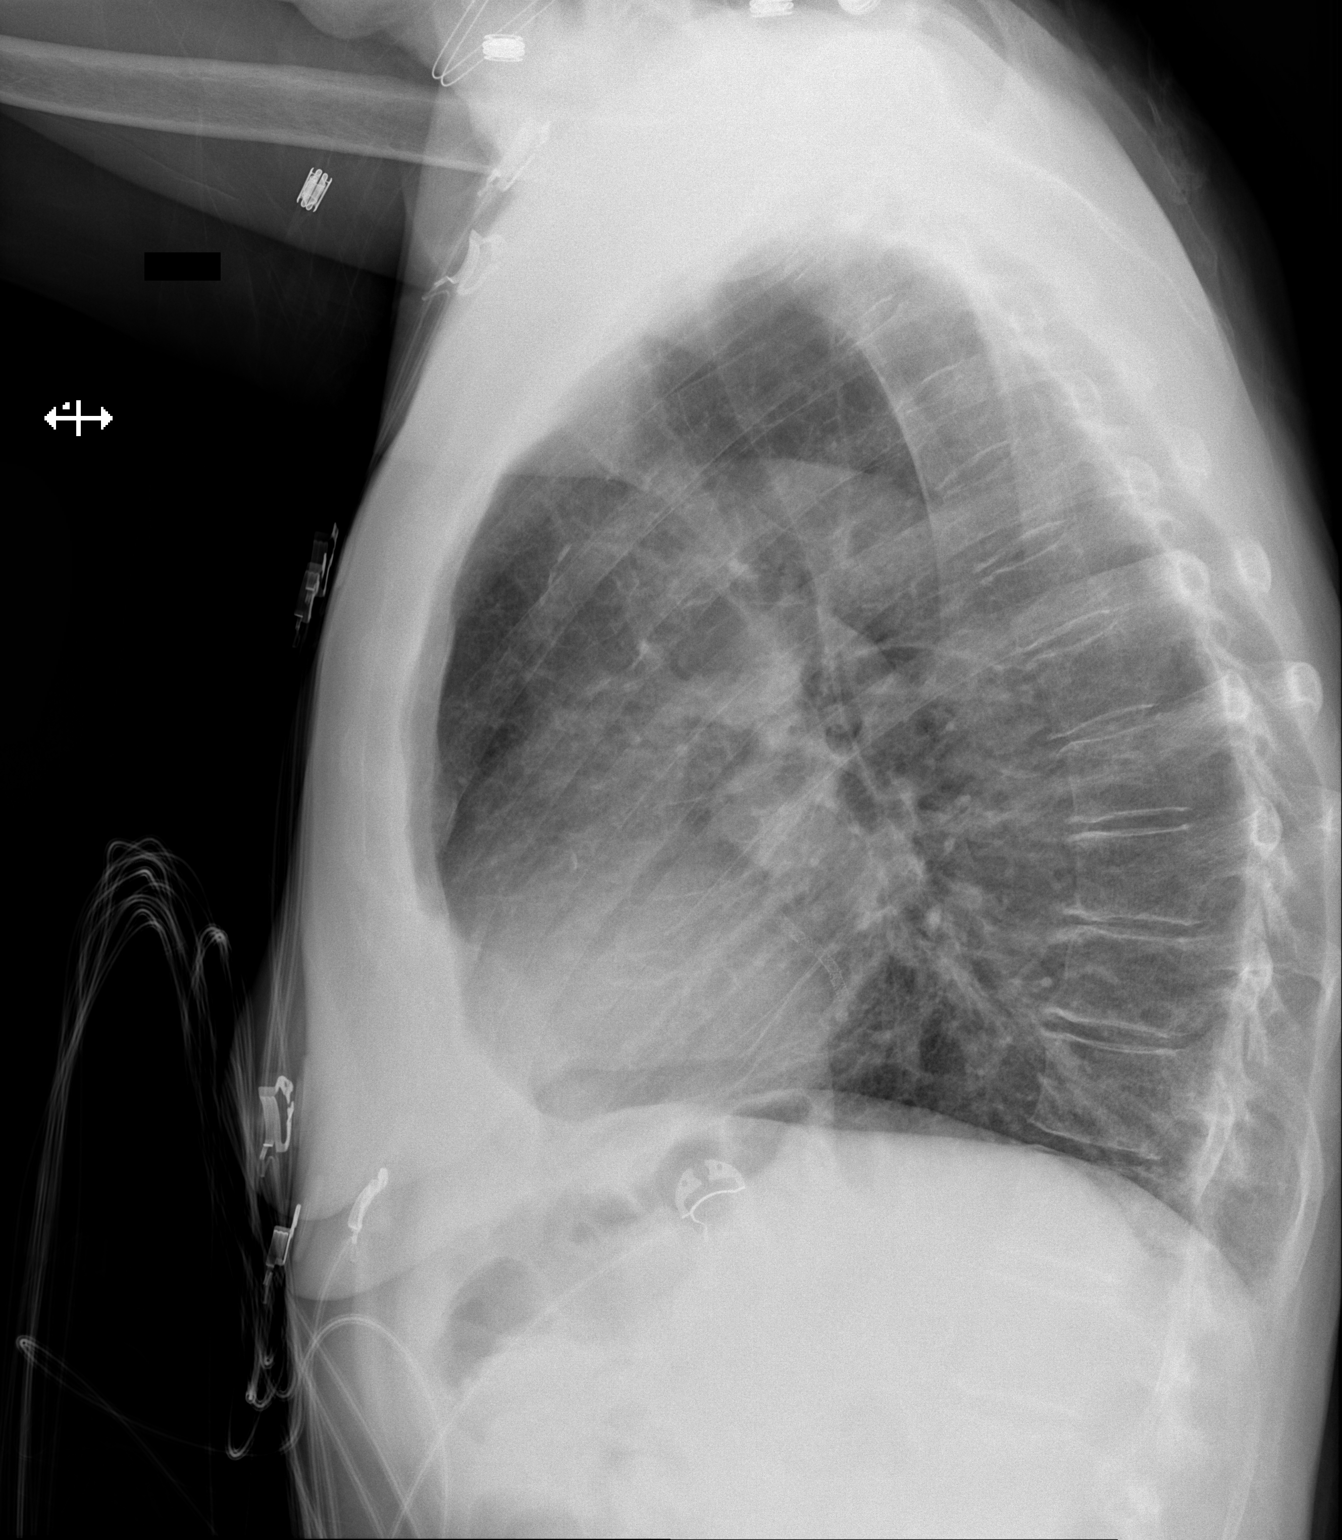

[x chest ap]
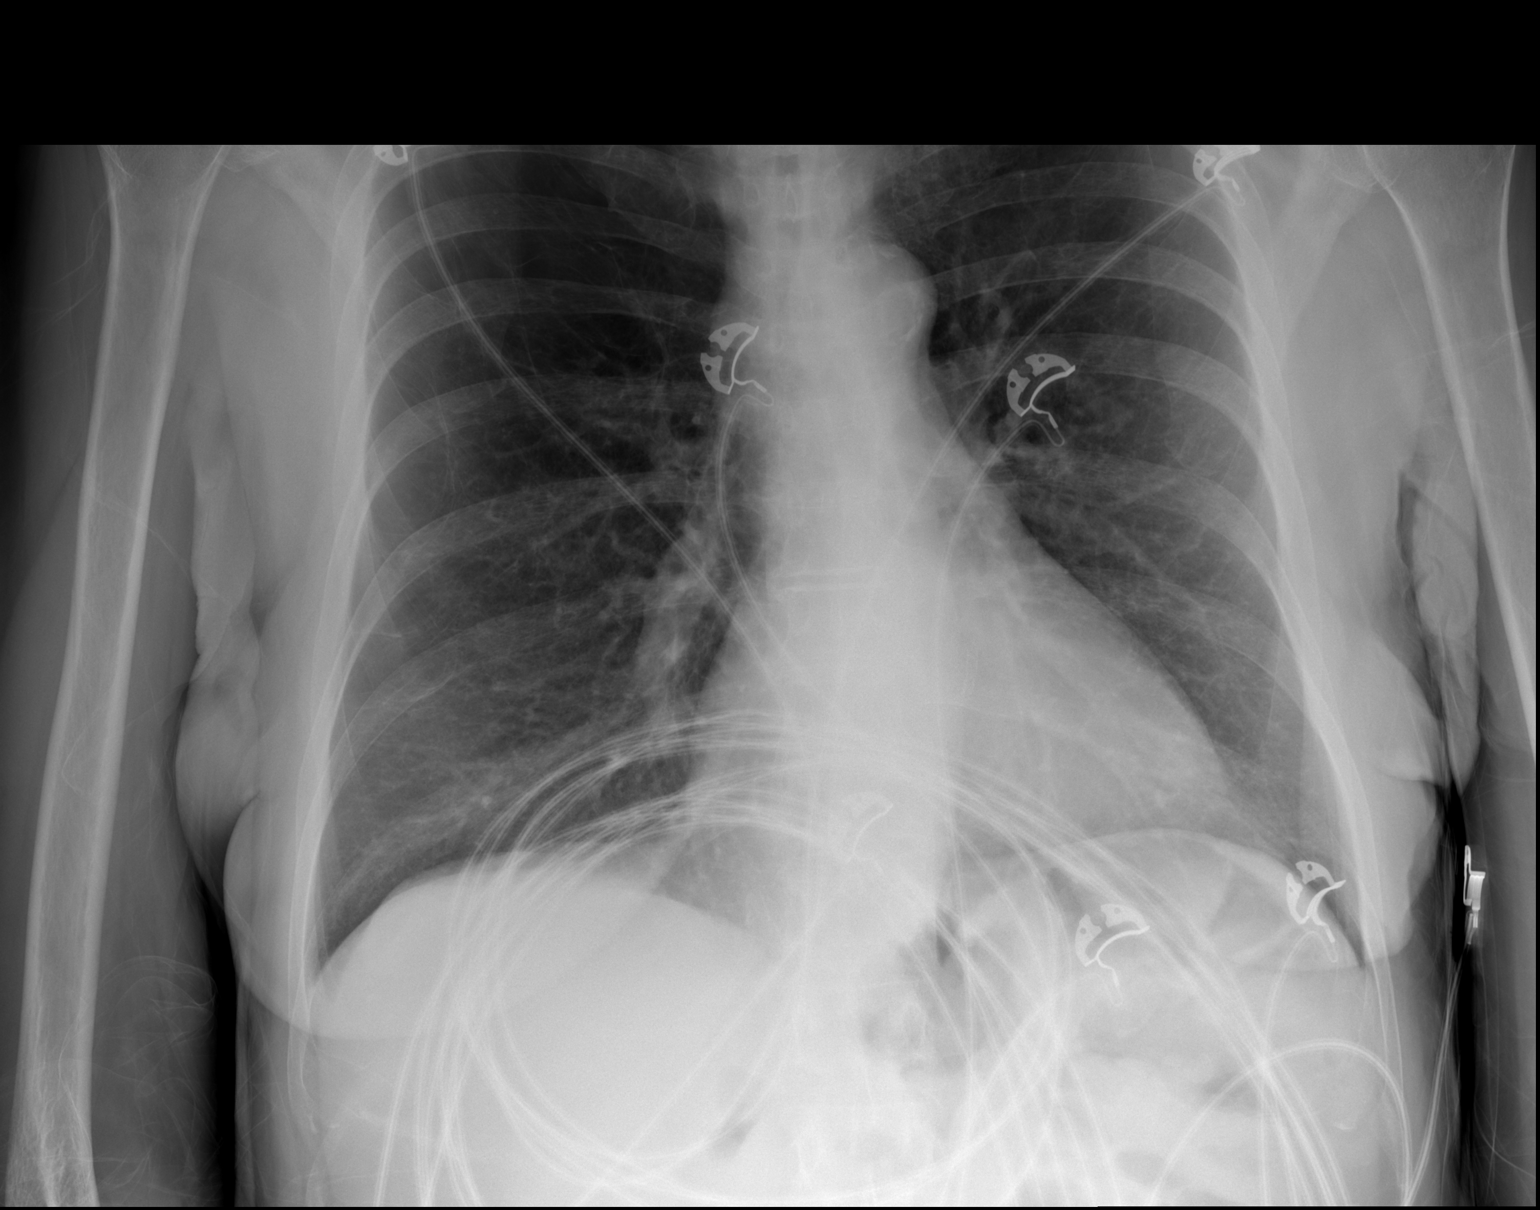

[2 of 2 positions shown; findings below may reference images not displayed]

FINDINGS: The heart size and mediastinal contours are within normal limits.
Stable advanced emphysematous lung disease. Stable scarring at the
right lung base. There is no evidence of pulmonary edema,
consolidation, pneumothorax, nodule or pleural fluid. The visualized
skeletal structures are unremarkable.
IMPRESSION: Stable emphysema.  No acute findings.
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
## 2018-07-10 ENCOUNTER — Ambulatory Visit (INDEPENDENT_AMBULATORY_CARE_PROVIDER_SITE_OTHER): Payer: Medicare Other | Admitting: Pulmonary Disease

## 2018-07-10 ENCOUNTER — Ambulatory Visit (INDEPENDENT_AMBULATORY_CARE_PROVIDER_SITE_OTHER)
Admission: RE | Admit: 2018-07-10 | Discharge: 2018-07-10 | Disposition: A | Discharge status: Home / Self Care | Payer: Medicare Other | Source: Ambulatory Visit | Attending: Pulmonary Disease | Admitting: Pulmonary Disease

## 2018-07-10 ENCOUNTER — Encounter: Payer: Self-pay | Admitting: Pulmonary Disease

## 2018-07-10 VITALS — BP 96/58 | HR 99 | Ht 66.0 in | Wt 120.0 lb

## 2018-07-10 DIAGNOSIS — J189 Pneumonia, unspecified organism: Secondary | ICD-10-CM

## 2018-07-10 DIAGNOSIS — Z720 Tobacco use: Secondary | ICD-10-CM | POA: Diagnosis not present

## 2018-07-10 DIAGNOSIS — J479 Bronchiectasis, uncomplicated: Secondary | ICD-10-CM

## 2018-07-10 DIAGNOSIS — I208 Other forms of angina pectoris: Secondary | ICD-10-CM | POA: Diagnosis not present

## 2018-07-10 DIAGNOSIS — J439 Emphysema, unspecified: Secondary | ICD-10-CM | POA: Diagnosis not present

## 2018-07-10 NOTE — Assessment & Plan Note (Signed)
Plan: Follow-up with cardiology

## 2018-07-10 NOTE — Assessment & Plan Note (Signed)
Assessment: Morning cough White sputum Improved sputum color since last office visit Reports adherence to flutter valve use  Plan: Continue flutter valve use Chest x-ray today shows resolution of previous pneumonia Start daily antihistamine Continue Flonase Follow-up in 2 to 3 months

## 2018-07-10 NOTE — Progress Notes (Signed)
Discussed results with patient in office.  Nothing further is needed at this time.  [name] Mack FNP  

## 2018-07-10 NOTE — Assessment & Plan Note (Signed)
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 -Patient is a former smoker -60-pack-year smoking history, husband still smokes 1-1/2 packs/day -mMRC 3 today -improved Nonadherence to Arnuity Ellipta -Adherence to Spiriva HandiHaler -Slight expiratory wheezes  Plan: Chest x-ray today >>>improved  Walk in office today on RA, completed 3 laps with no desats -PFTs ordered  -Continue Spiriva HandiHaler daily -Continue Arnuity Ellipta daily -Discussed extensively with patient the need for her to make it a priority to take her inhalers as prescribed -Discussed with spouse as well as patient is need to smoke outside of the house and not in the car while the patient is riding with him -Continue flutter valve use as prescribed -Start daily antihistamine -Start Flonase 1 spray each nostril -Consider referral to lung cancer screening program for 2020 scan if patient receives no more CT imaging by November/2020

## 2018-07-10 NOTE — Assessment & Plan Note (Signed)
Plan: Continue to not smoke Avoid being around others to smoke Consider referral to lung cancer screening program in the future if patient receives no other CT imaging by November/2020

## 2018-07-10 NOTE — Progress Notes (Signed)
Reviewed and agree with assessment/plan.   Sherman Lipuma, MD [facility] 05/29/2016, 12:24 PM Pager:  [phone]  

## 2018-07-10 NOTE — Patient Instructions (Addendum)
Chest x-ray today >>> improved   Walk in office today to assess for oxygenation needs   Continue Spiriva HandiHaler daily as prescribed Continue Arnuity Ellipta daily as prescribed  We will explain and show you how to use the flutter valve today.  Unfortunately we cannot demonstrate unless you purchase 1.  Bronchiectasis: This is the medical term which indicates that you have damage, dilated airways making you more susceptible to respiratory infection. Use a flutter valve 10 breaths twice a day or 4 to 5 breaths 4-5 times a day to help clear mucus out Let us know if you have cough with change in mucus color or fevers or chills.  At that point you would need an antibiotic. Maintain a healthy nutritious diet, eating whole foods Take your medications as prescribed   Please start taking a daily antihistamine:  >>>choose one of: zyrtec, claritin, allegra, or xyzal  >>>these are over the counter medications  >>>can choose generic option  >>>take daily  >>>this medication helps with allergies, post nasal drip, and cough    Can use Flonase 1 spray each nostril as needed for nasal congestion Can start daily nasal saline rinses  Continue to avoid known triggers to your breathing such as cigarette smoke Encourage anyone in your home to smoke outside and not in the car if you are with them  Follow-up in 2-3 months with Dr. Halford Chessman and a Pulmonary Function Test   You need to schedule a follow-up with cardiology as you are reporting intermittent chest pain and you have not seen them in a while  Continue follow-up with ENT regarding your right neck pain    It is flu season:   >>>Remember to be washing your hands regularly, using hand sanitizer, be careful to use around herself with has contact with people who are sick will increase her chances of getting sick yourself. >>> Best ways to protect herself from the flu: Receive the yearly flu vaccine, practice good hand hygiene washing  with soap and also using hand sanitizer when available, eat a nutritious meals, get adequate rest, hydrate appropriately   Please contact the office if your symptoms worsen or you have concerns that you are not improving.   Thank you for choosing [facility] for your healthcare, and for allowing [place] to partner with you on your healthcare journey. I am thankful to be able to provide care to you today.   Wyn Quaker FNP-C

## 2018-07-12 IMAGING — CR DG CHEST 2V
2 series · 2 of 2 positions shown · non-contrast
Comparison: 03/09/2016, 02/09/2016

CLINICAL DATA: Generalized chest pain. Shortness of breath for 3
days

EXAM:
CHEST  2 VIEW

[w chest lat]
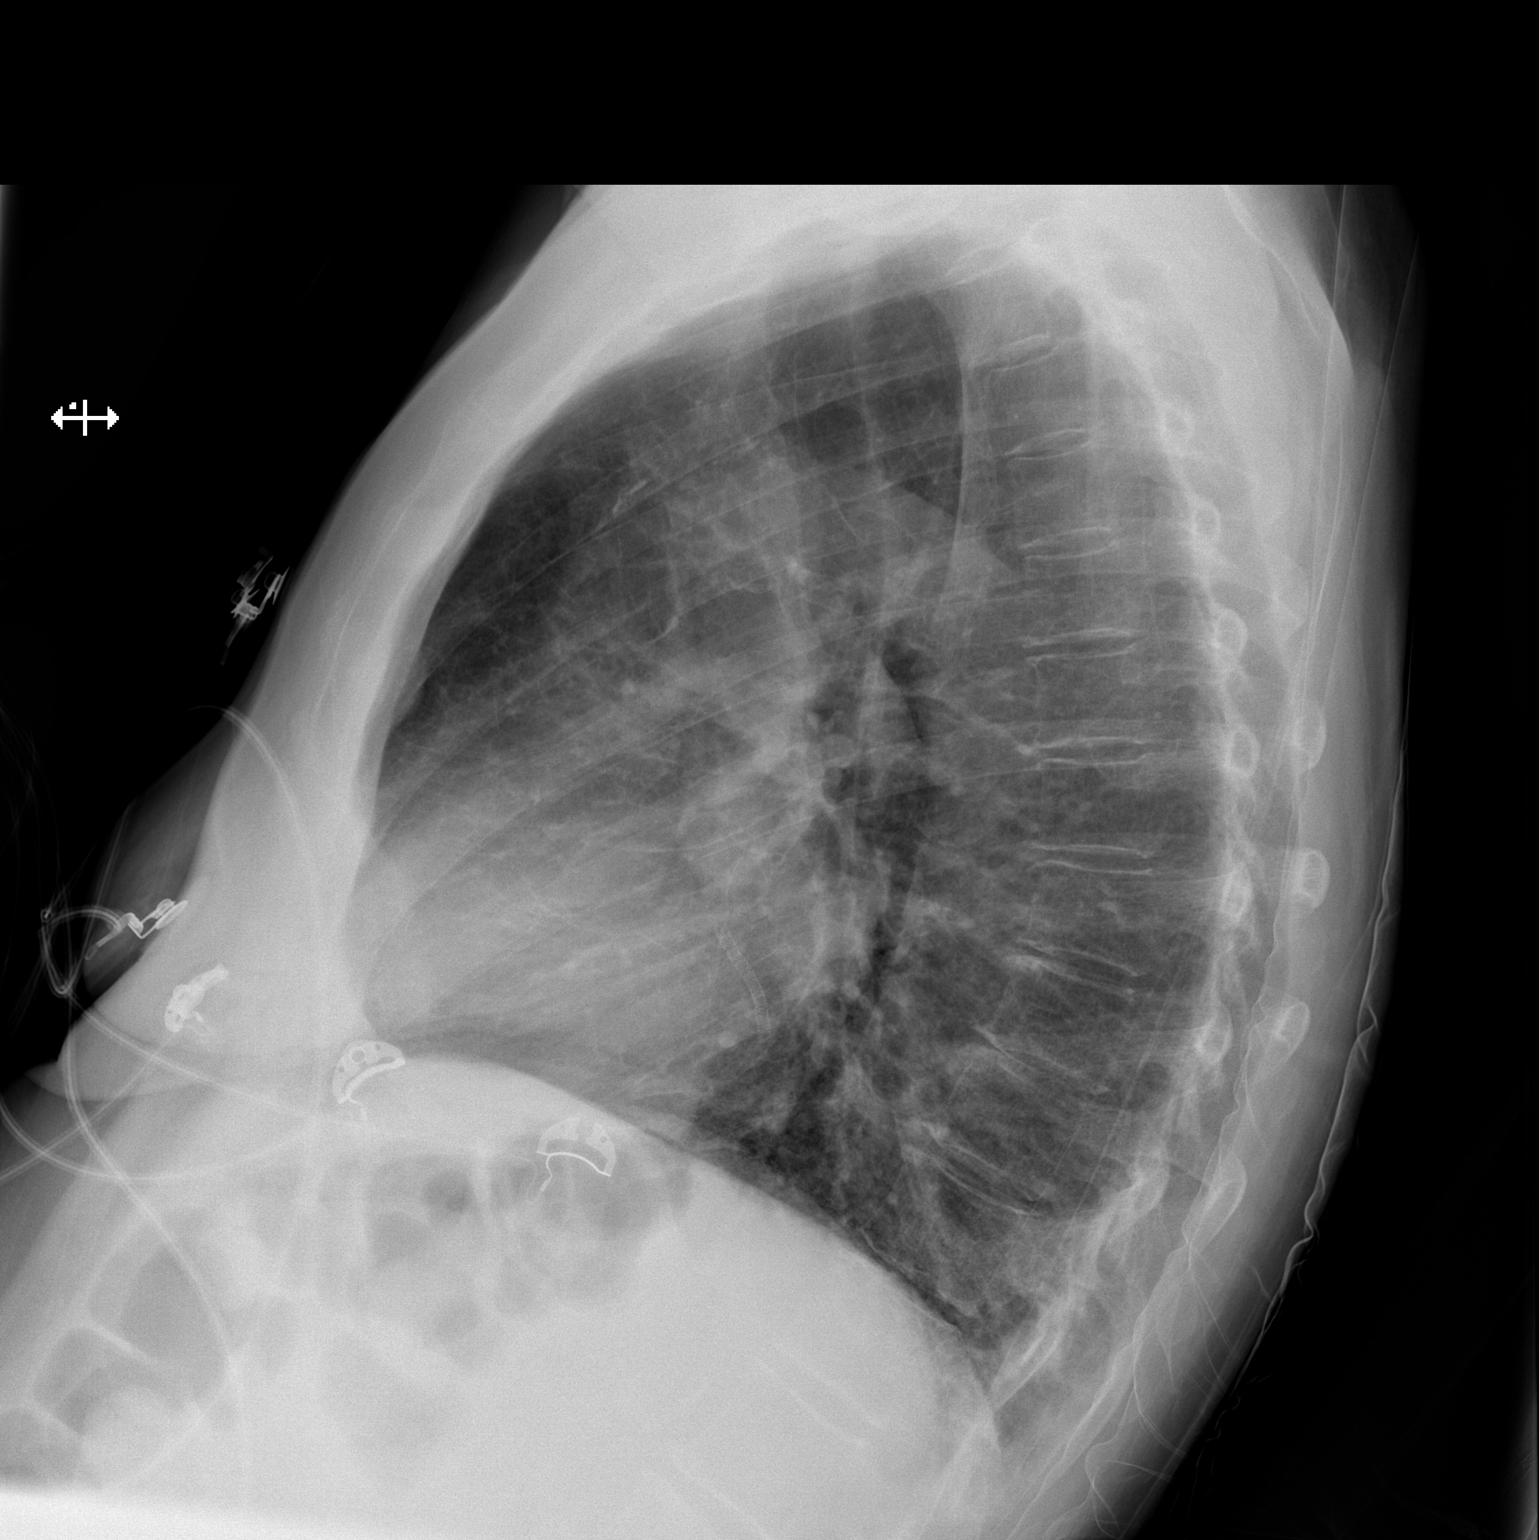

[x chest ap]
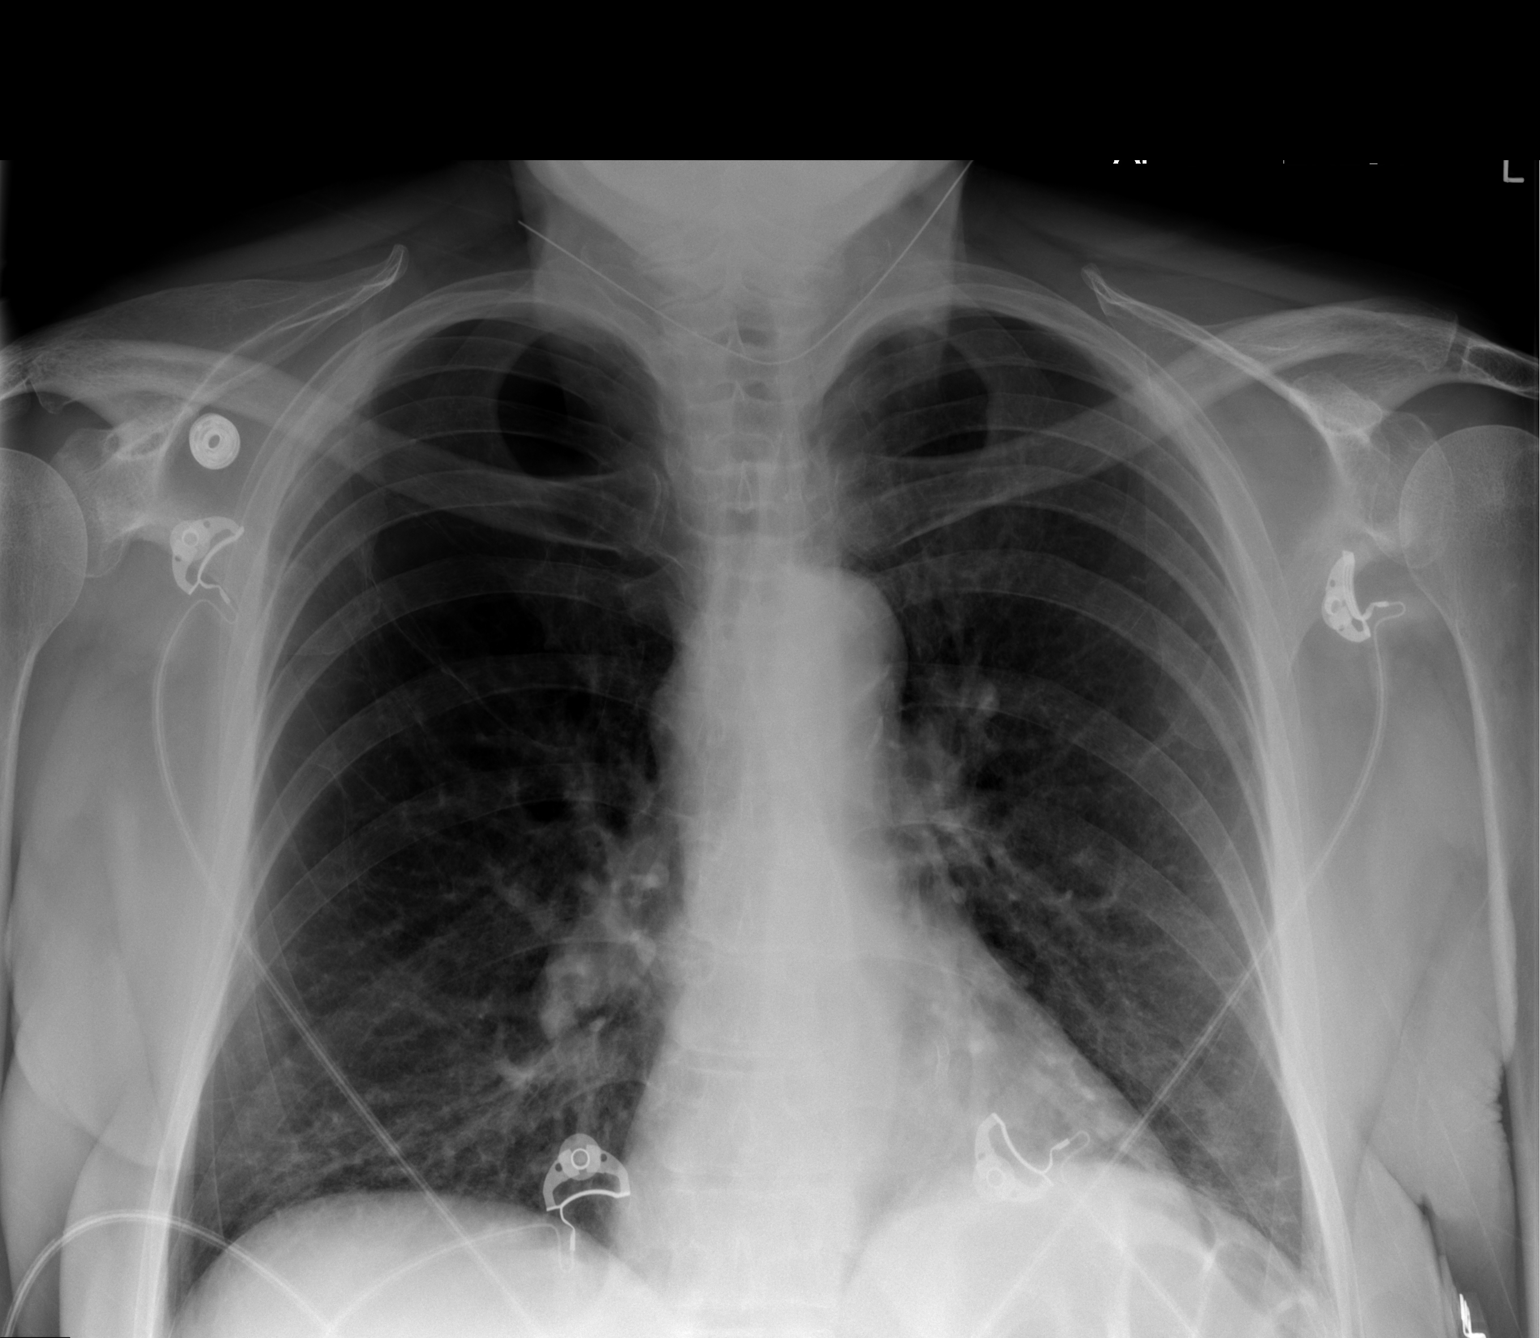

[2 of 2 positions shown; findings below may reference images not displayed]

FINDINGS: Mild bibasilar opacities could reflect atelectasis. No focal
consolidation or effusion. Marked emphysematous disease within the
upper lobes, right greater than left. Normal heart size.
Atherosclerosis of the aorta. No pneumothorax.
IMPRESSION: 1. Marked emphysematous changes in the bilateral upper lobes.
2. Streaky bibasilar atelectasis versus mild interstitial
inflammation.

## 2018-07-13 IMAGING — CT CT ANGIO CHEST
2 of 6 series · 18 of 36 positions shown · IV contrast (ISOVUE 370)
Comparison: Chest radiograph performed 03/14/2016, and PET/CT
performed 02/16/2016

CLINICAL DATA: Acute onset of shortness of breath. Recently
diagnosed with small cell lung cancer, on chemotherapy. Initial
encounter.

EXAM:
CT ANGIOGRAPHY CHEST WITH CONTRAST
TECHNIQUE: Multidetector CT imaging of the chest was performed using the
standard protocol during bolus administration of intravenous
contrast. Multiplanar CT image reconstructions and MIPs were
obtained to evaluate the vascular anatomy.
CONTRAST:  80 mL of Isovue 370 IV contrast

[Series 5: thins for pacs · axial · 0.64mm/px · z∈[+941,+1158]mm · 17 of 243 slices shown]
[im 13/243  lung]
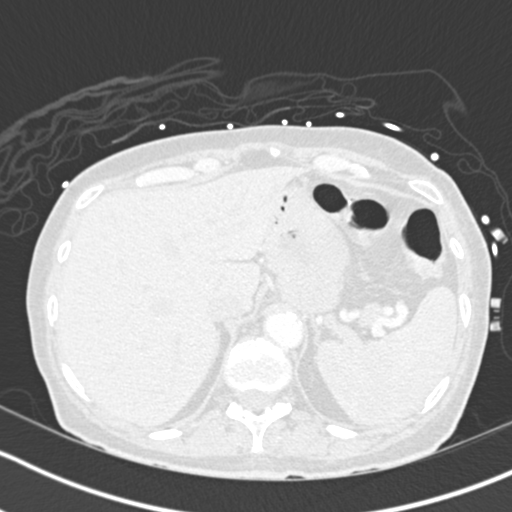
[im 25/243  mediastinal]
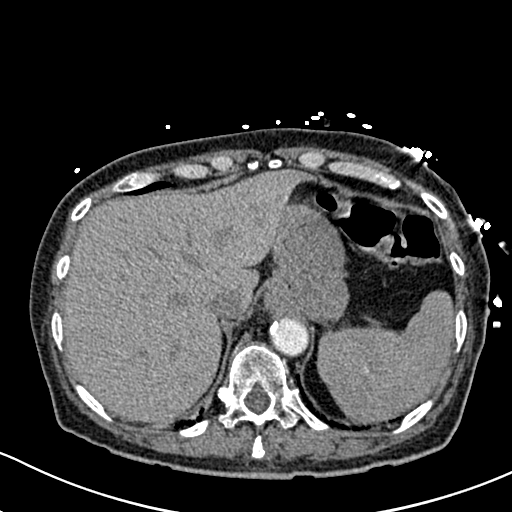
[im 37/243  lung]
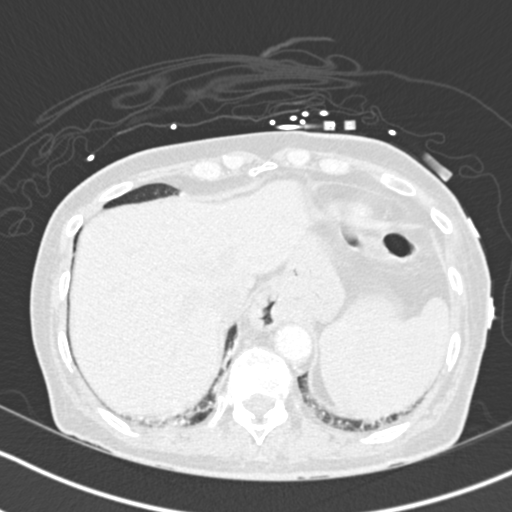
[im 49/243  mediastinal]
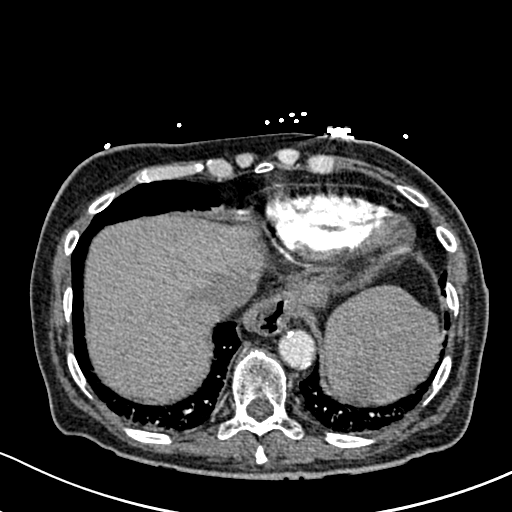
[im 73/243  lung]
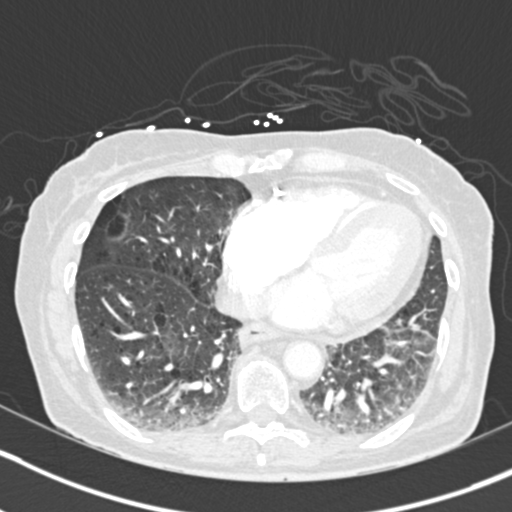
[im 85/243  mediastinal]
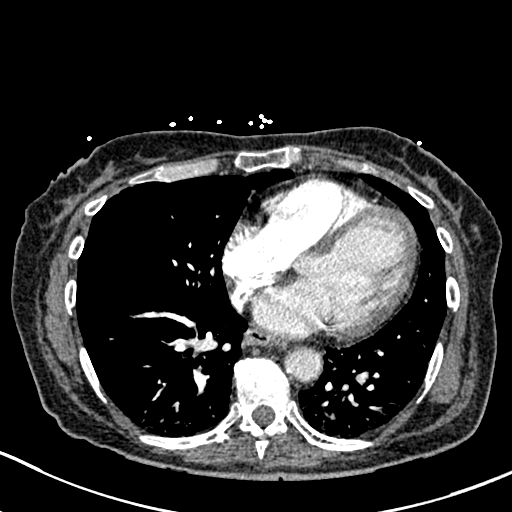
[im 97/243  lung]
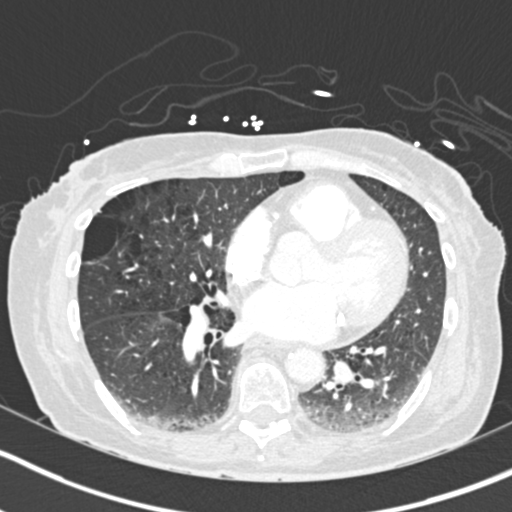
[im 109/243  mediastinal]
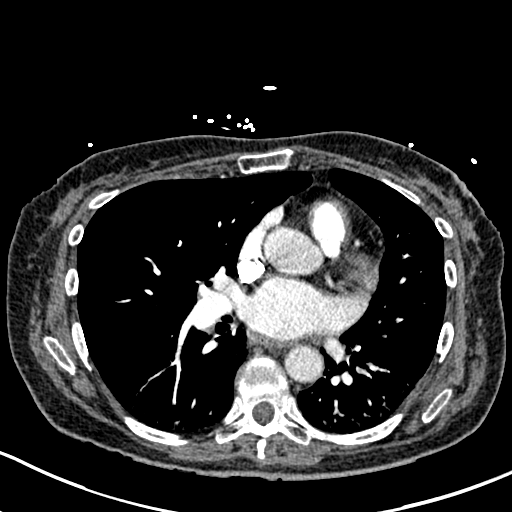
[im 122/243  lung]
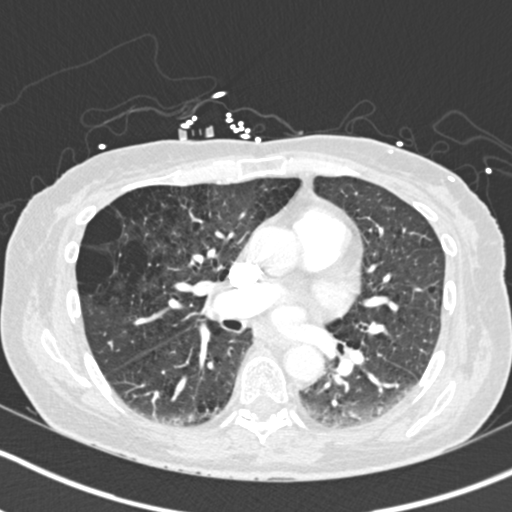
[im 134/243  mediastinal]
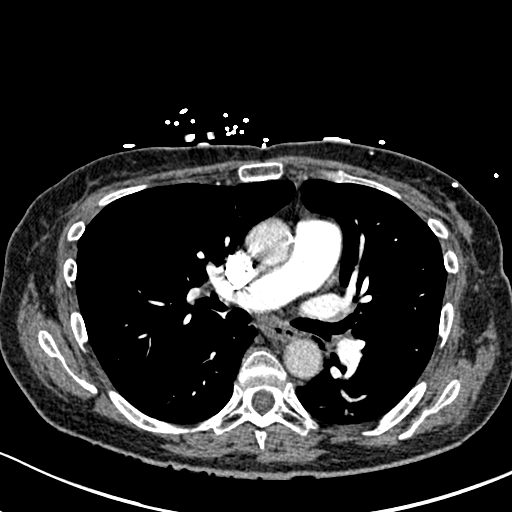
[im 146/243  lung]
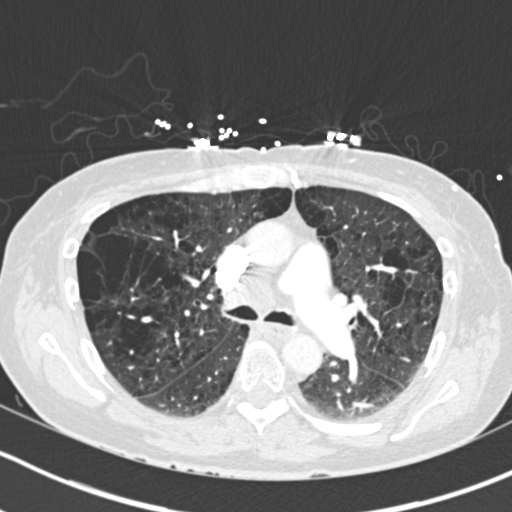
[im 158/243  mediastinal]
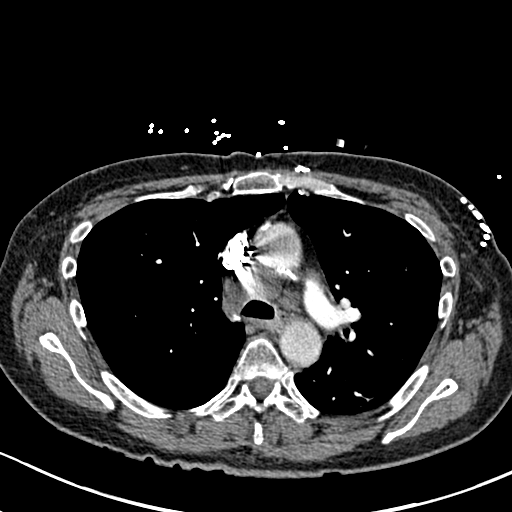
[im 170/243  lung]
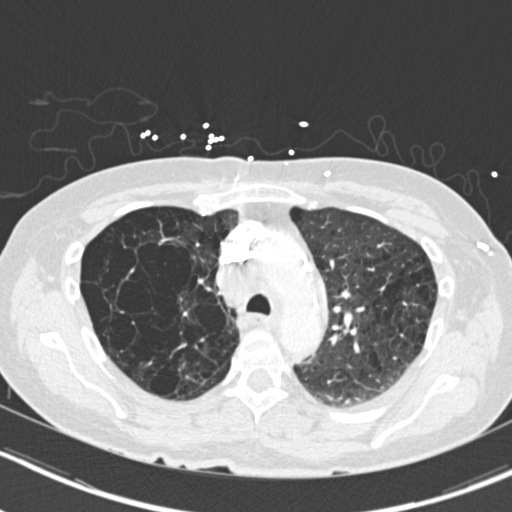
[im 194/243  mediastinal]
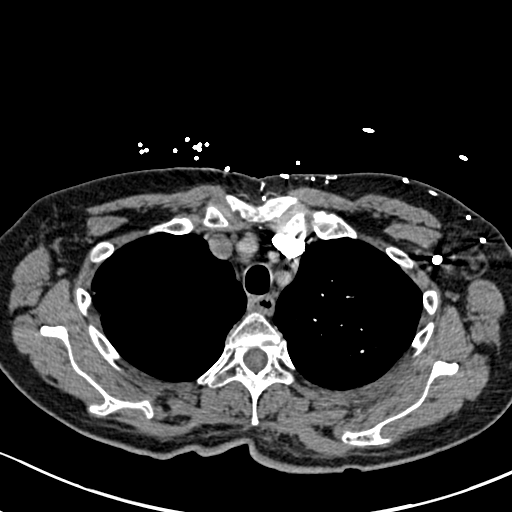
[im 206/243  lung]
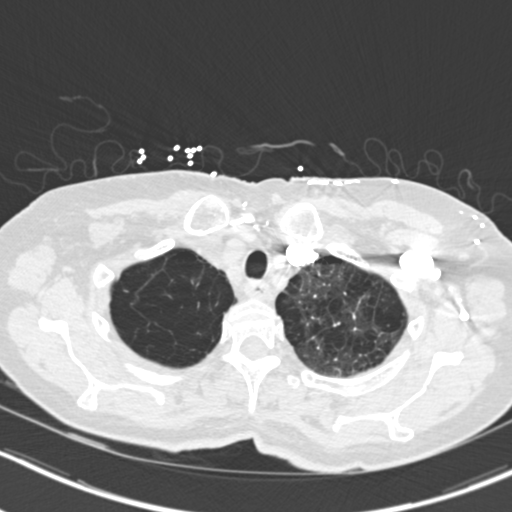
[im 218/243  mediastinal]
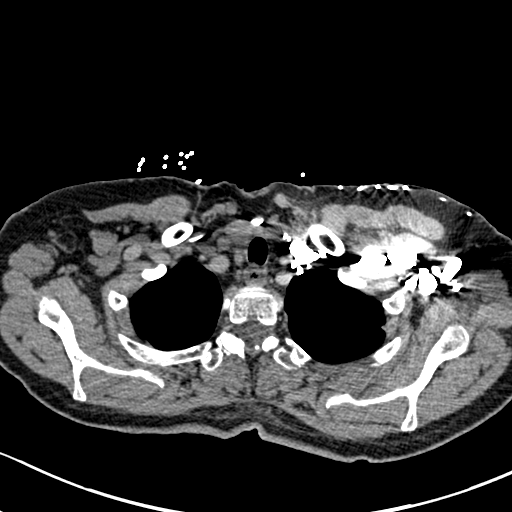
[im 230/243  lung]
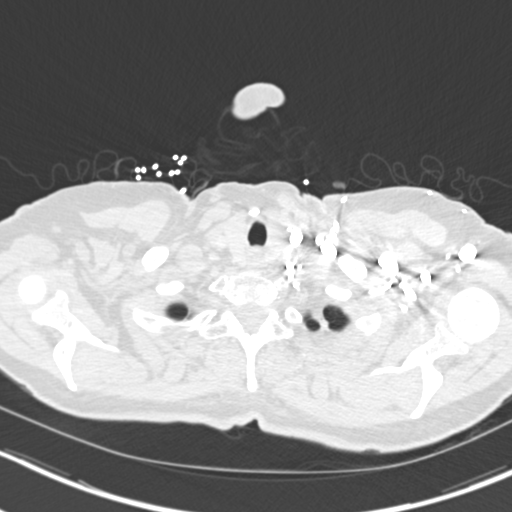

[Series 7: coronal mpr · coronal · 0.47mm/px · 1 of 105 slices shown]
[im 53/105  mediastinal]
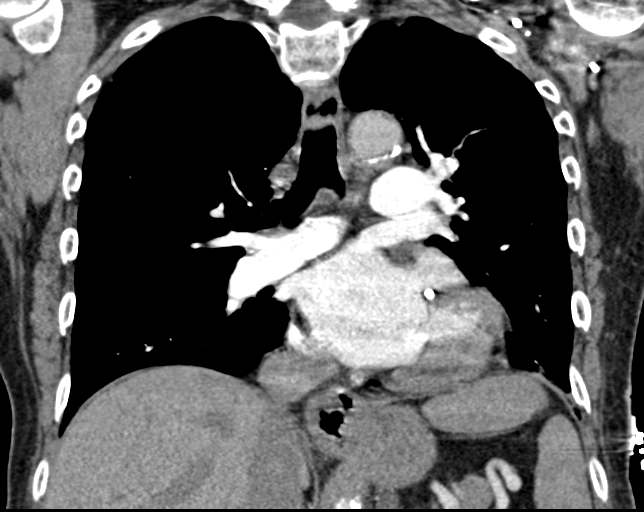

[18 of 36 positions shown; findings below may reference images not displayed]

FINDINGS: Cardiovascular:  There is no evidence of pulmonary embolus.

Scattered calcification is noted along the thoracic aorta. Diffuse
coronary artery calcifications are seen. The heart is grossly
unremarkable in appearance. The proximal great vessels are
unremarkable.

Mediastinum/Nodes: Enlarged mediastinal nodes are seen, measuring up
to 2.5 cm in short axis, at the right paratracheal and precarinal
regions. These likely reflect metastatic disease. No pericardial
effusion is identified. The visualized portions of the thyroid gland
are unremarkable. No axillary lymphadenopathy is seen.

A small hiatal hernia is noted.

Lungs/Pleura: Bibasilar atelectasis is noted. Emphysema is noted
bilaterally, more prominent at the right lung apex. No pulmonary
nodules are seen. No pleural effusion or pneumothorax is identified.

Upper Abdomen: The visualized portions of the liver and spleen are
grossly unremarkable.

Musculoskeletal: No acute osseous abnormalities are identified. The
visualized musculature is unremarkable in appearance.

Review of the MIP images confirms the above findings.
IMPRESSION: 1. No evidence of pulmonary embolus.
2. Enlarged mediastinal nodes, measuring up to 2.5 cm in short axis,
reflecting the patient's known nodal metastases. These are perhaps
slightly less prominent than on the recent prior PET/CT.
3. Diffuse coronary artery calcifications seen.
4. Small hiatal hernia noted.
5. Bibasilar atelectasis noted. Bilateral emphysema, more prominent
at the right lung apex.

## 2018-07-16 ENCOUNTER — Encounter (HOSPITAL_COMMUNITY): Payer: Self-pay

## 2018-07-16 ENCOUNTER — Ambulatory Visit (HOSPITAL_COMMUNITY): Admit: 2018-07-16 | Payer: Medicare Other | Admitting: Gastroenterology

## 2018-07-16 SURGERY — ESOPHAGOGASTRODUODENOSCOPY (EGD) WITH PROPOFOL
Anesthesia: Monitor Anesthesia Care

## 2018-07-19 ENCOUNTER — Other Ambulatory Visit: Payer: Self-pay | Admitting: Pulmonary Disease

## 2018-07-24 ENCOUNTER — Other Ambulatory Visit: Payer: Self-pay | Admitting: Cardiology

## 2018-07-24 DIAGNOSIS — E785 Hyperlipidemia, unspecified: Secondary | ICD-10-CM

## 2018-07-24 DIAGNOSIS — Z79899 Other long term (current) drug therapy: Secondary | ICD-10-CM

## 2018-07-28 DIAGNOSIS — N952 Postmenopausal atrophic vaginitis: Secondary | ICD-10-CM | POA: Diagnosis not present

## 2018-07-28 DIAGNOSIS — N393 Stress incontinence (female) (male): Secondary | ICD-10-CM | POA: Diagnosis not present

## 2018-07-28 DIAGNOSIS — N281 Cyst of kidney, acquired: Secondary | ICD-10-CM | POA: Diagnosis not present

## 2018-07-28 DIAGNOSIS — N3020 Other chronic cystitis without hematuria: Secondary | ICD-10-CM | POA: Diagnosis not present

## 2018-07-28 IMAGING — CR DG CHEST 2V
2 series · 2 of 2 positions shown · non-contrast
Comparison: 03/14/2016.

CLINICAL DATA: Dyspnea. Burning sensations with urination. Blood in
bowel movements.

EXAM:
CHEST  2 VIEW

[w chest lat]
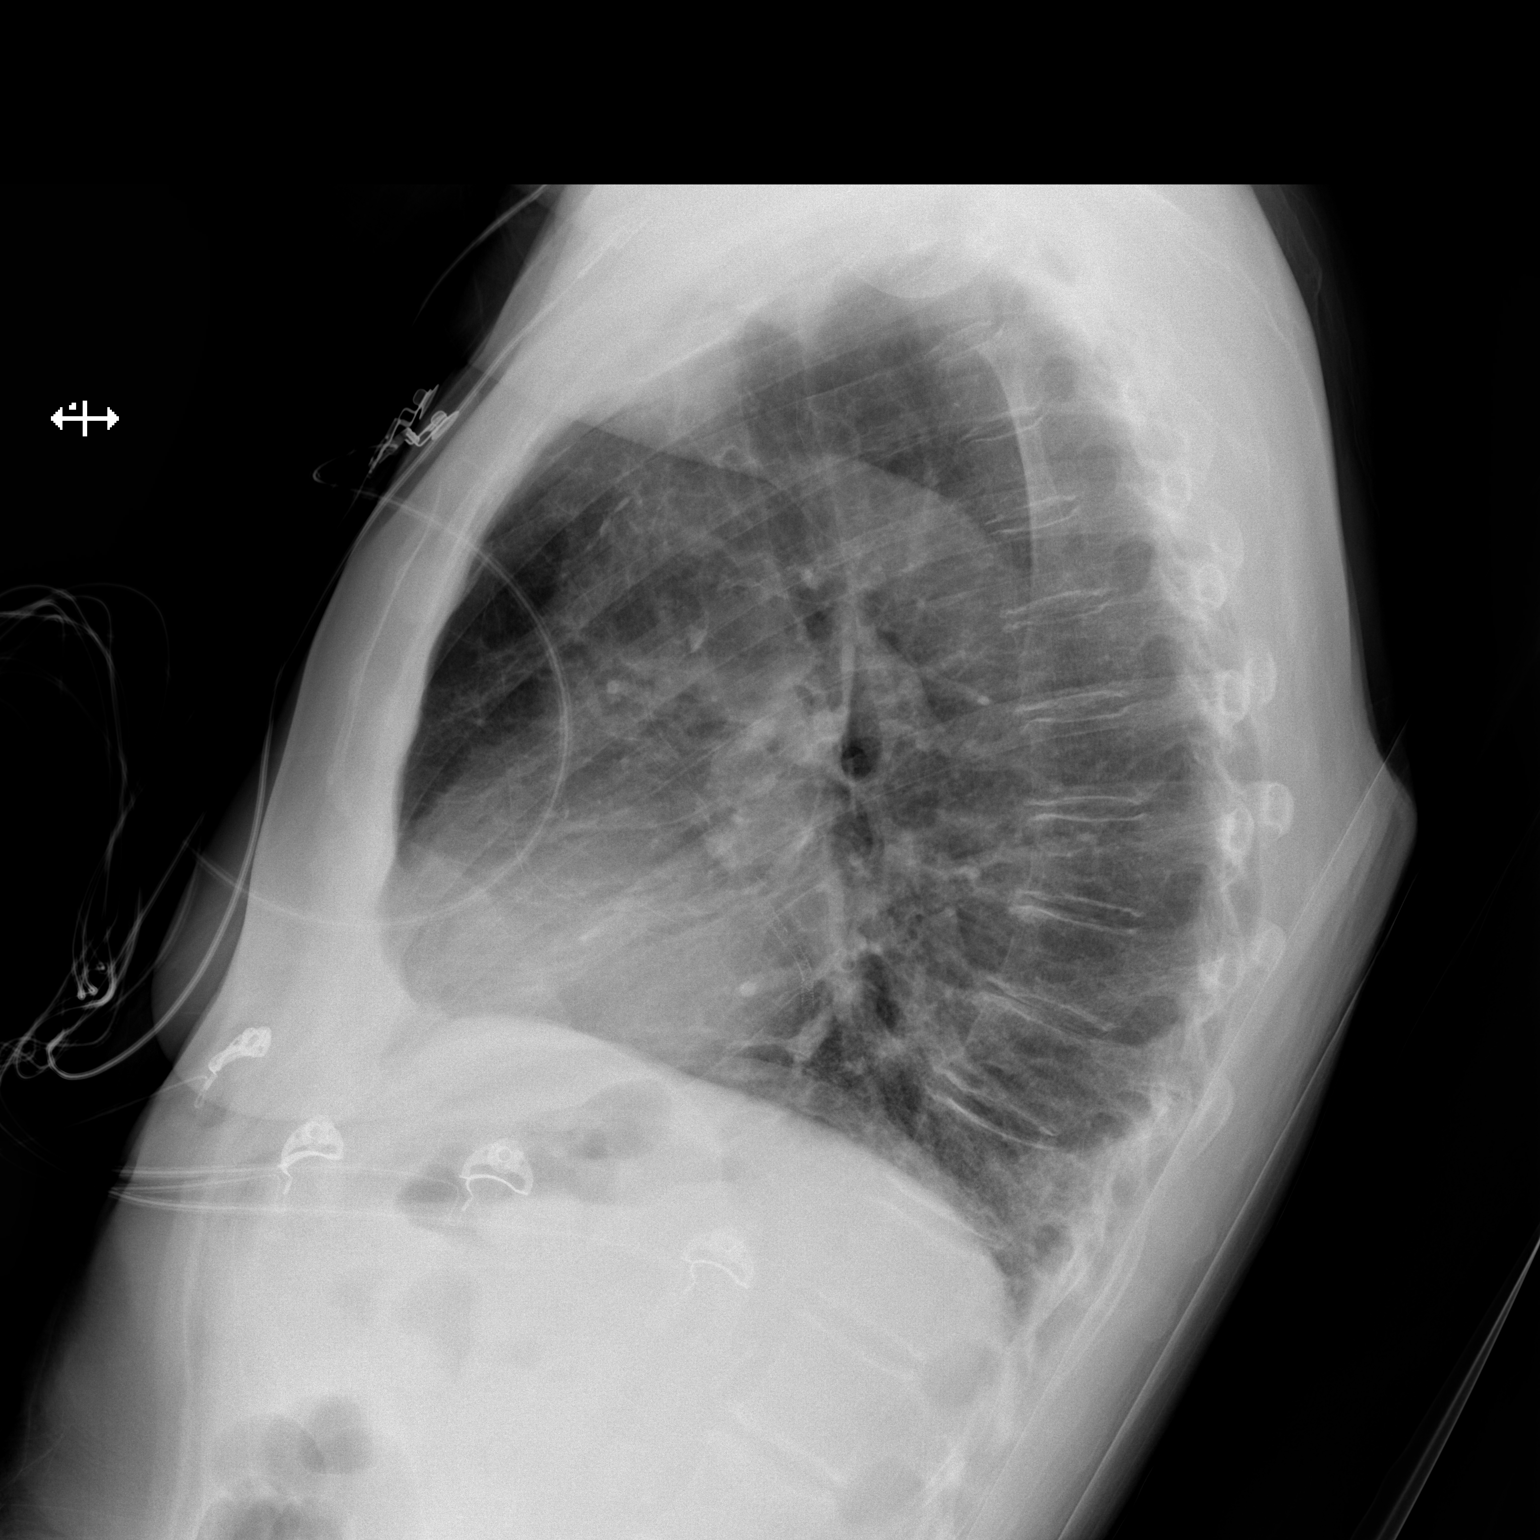

[x chest ap]
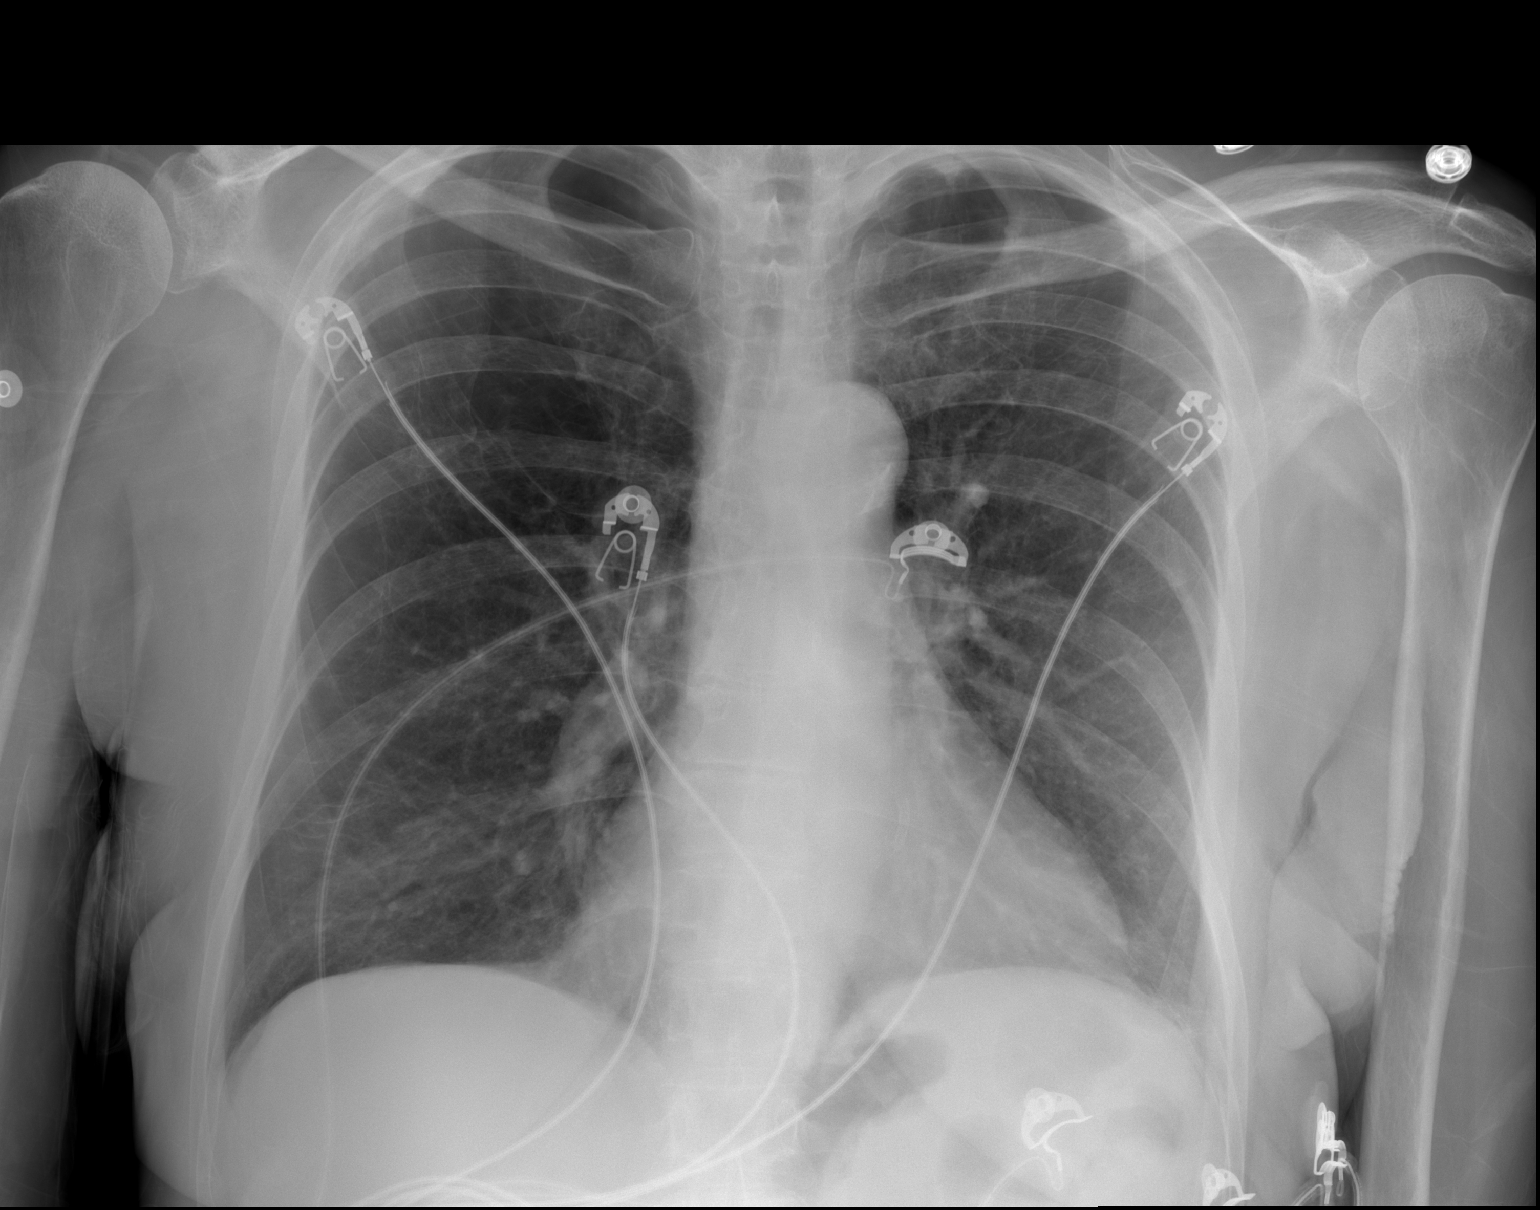

[2 of 2 positions shown; findings below may reference images not displayed]

FINDINGS: Normal sized heart. Stable changes of COPD with bilateral upper lobe
bullous changes. Small amount of airspace opacity at the posterior
lung bases on the lateral view. Small amount of linear density at
the left lateral lung base on the frontal view. Calcifications in
the aortic arch. Mild thoracic spine degenerative changes.
IMPRESSION: 1. Mild atelectasis or, less likely, pneumonia, at the posterior
lung bases on the lateral view.
2. Mild linear atelectasis at the left lung base.
3. Stable changes of COPD.

## 2018-08-03 ENCOUNTER — Other Ambulatory Visit: Payer: Self-pay | Admitting: Radiation Therapy

## 2018-08-03 DIAGNOSIS — C7949 Secondary malignant neoplasm of other parts of nervous system: Secondary | ICD-10-CM

## 2018-08-03 DIAGNOSIS — C7931 Secondary malignant neoplasm of brain: Secondary | ICD-10-CM

## 2018-08-04 ENCOUNTER — Other Ambulatory Visit: Payer: Self-pay | Admitting: Cardiology

## 2018-08-09 ENCOUNTER — Encounter: Payer: Self-pay | Admitting: Internal Medicine

## 2018-08-09 IMAGING — CT CT CHEST W/ CM
2 of 3 series · 15 of 36 positions shown, 18 images · IV contrast (iopamidol)
Comparison: 03/15/2016

CLINICAL DATA: Small-cell lung cancer.

EXAM:
CT CHEST WITH CONTRAST
TECHNIQUE: Multidetector CT imaging of the chest was performed during
intravenous contrast administration.
CONTRAST:  75mL VHLNJZ-Z22 IOPAMIDOL ([id number]-Z22) INJECTION 61%

[Series 2: axial st · axial · 0.65mm/px · z∈[-1,+255]mm · 12 of 152 slices shown, 15 images]
[im 12/152  mediastinal]
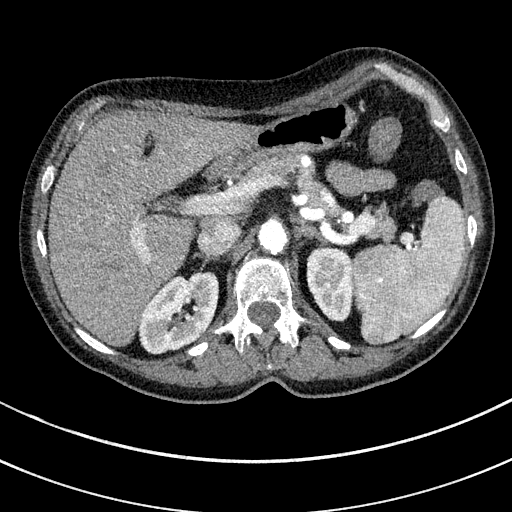
[im 12/152  lung]
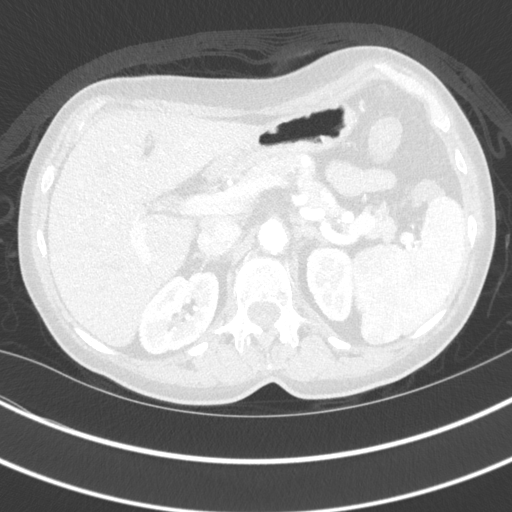
[im 23/152  lung]
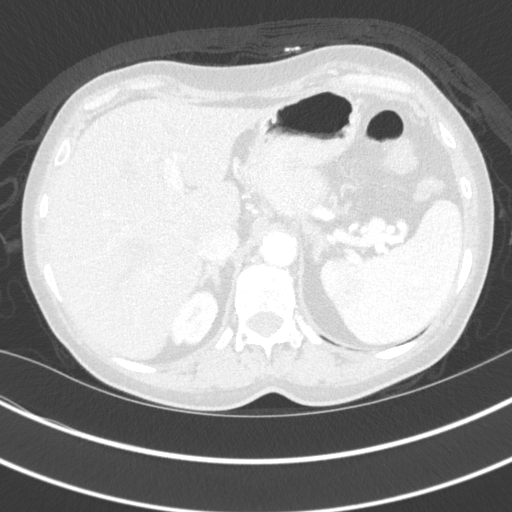
[im 34/152  lung]
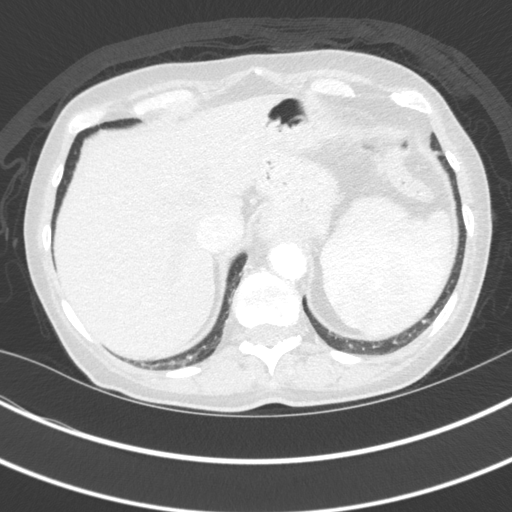
[im 45/152  lung]
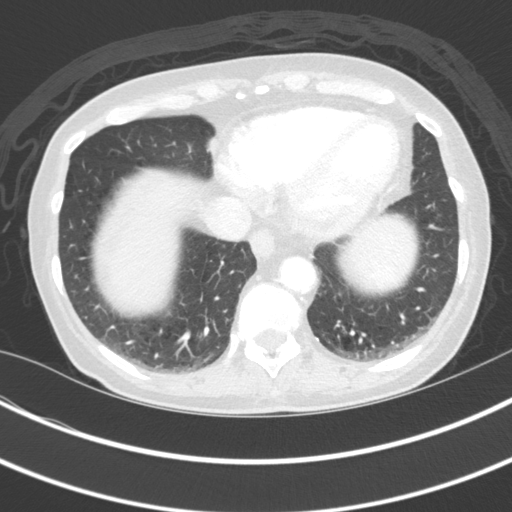
[im 56/152  mediastinal]
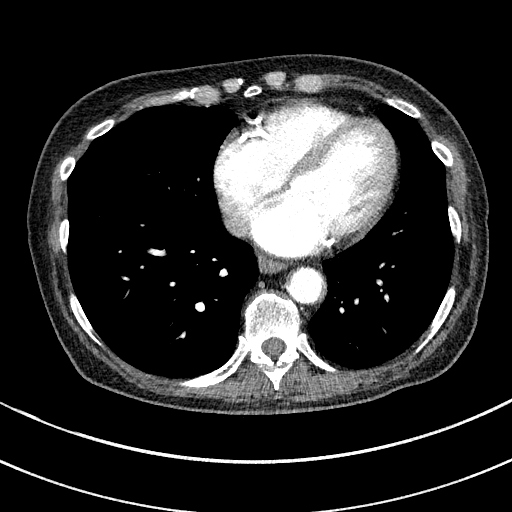
[im 56/152  lung]
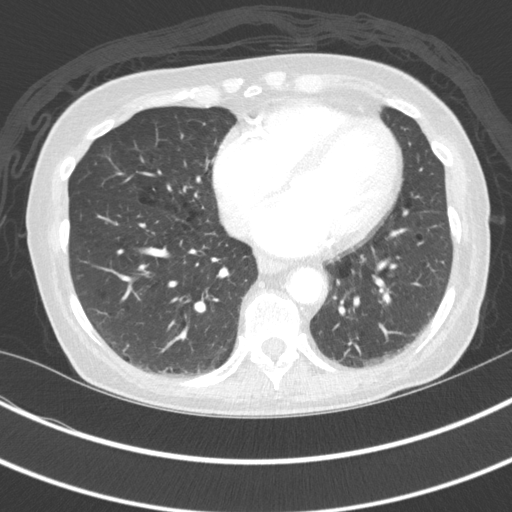
[im 68/152  lung]
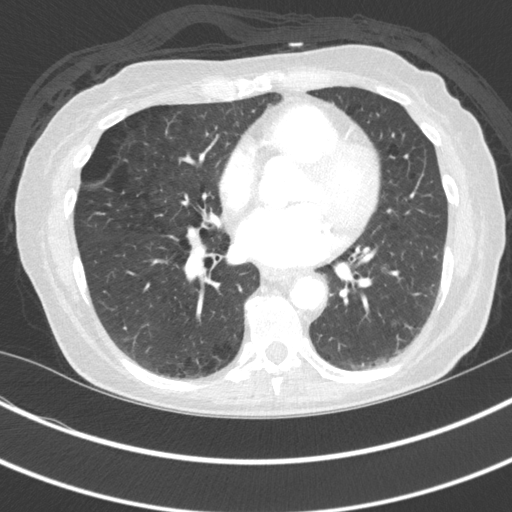
[im 84/152  lung]
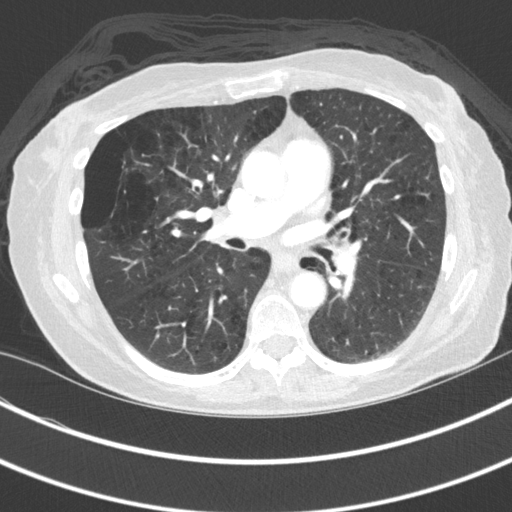
[im 96/152  lung]
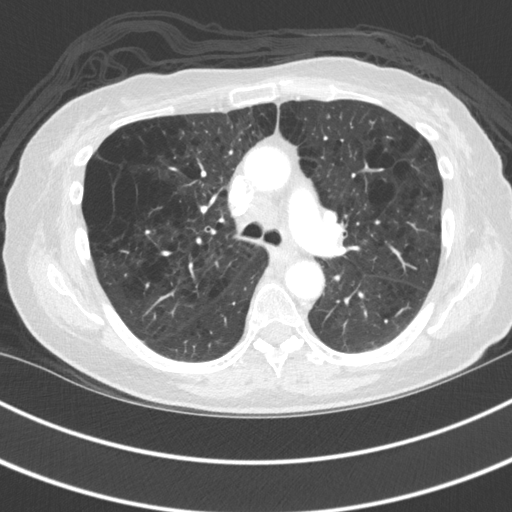
[im 107/152  mediastinal]
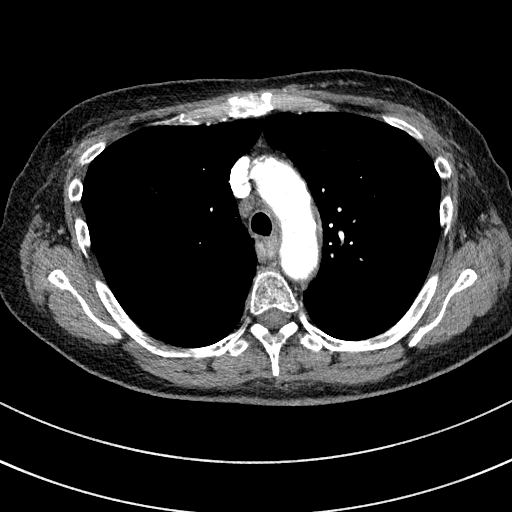
[im 107/152  lung]
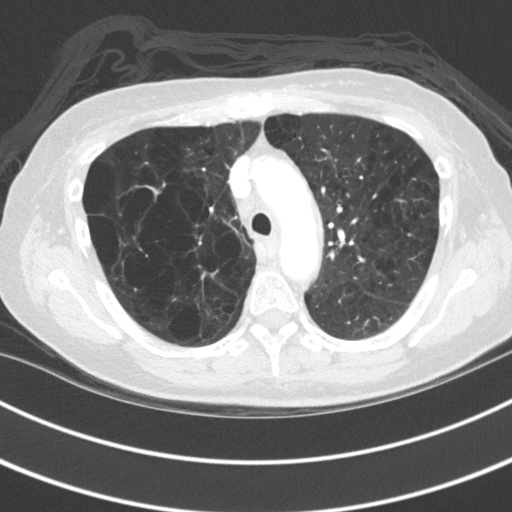
[im 118/152  lung]
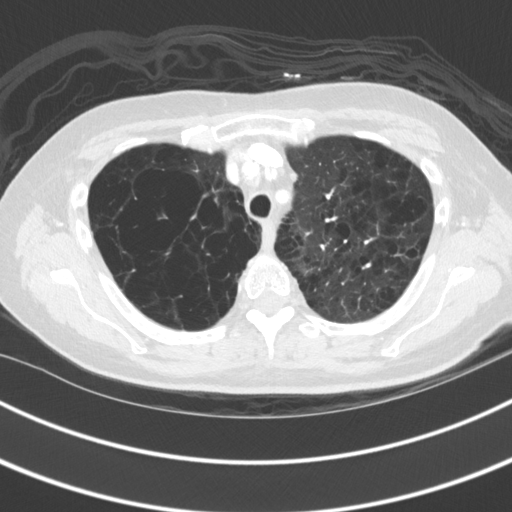
[im 129/152  lung]
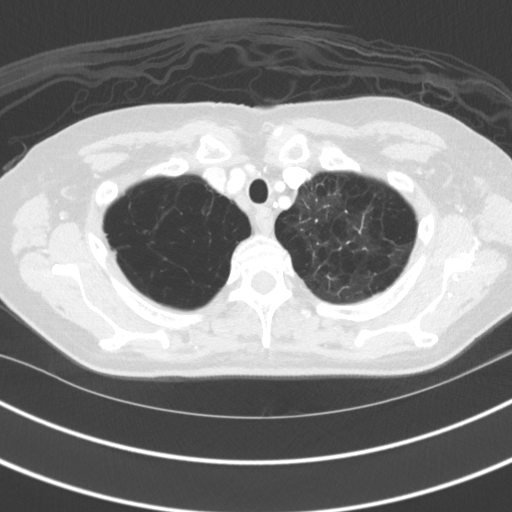
[im 140/152  lung]
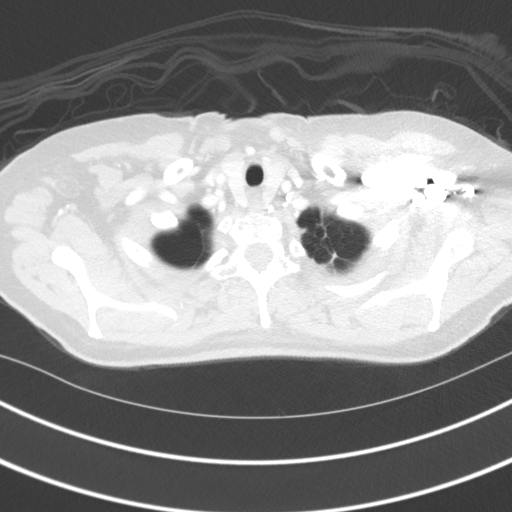

[Series 4: coronal · coronal · 0.68mm/px · 3 of 105 slices shown]
[im 21/105  lung]
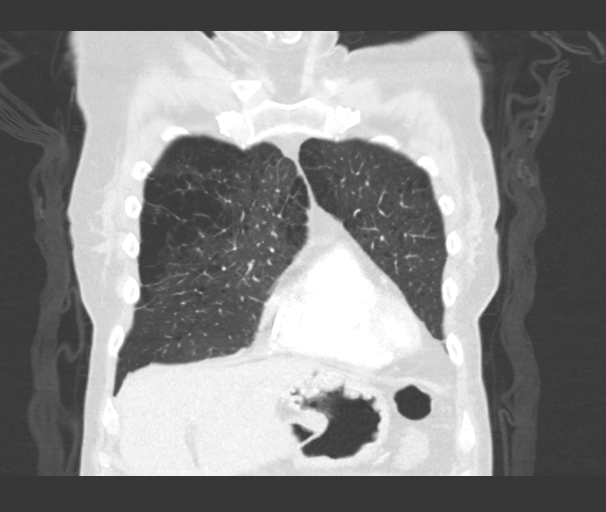
[im 42/105  lung]
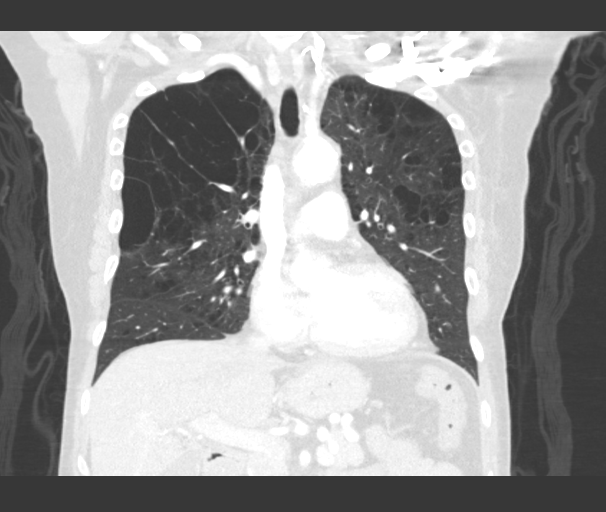
[im 63/105  lung]
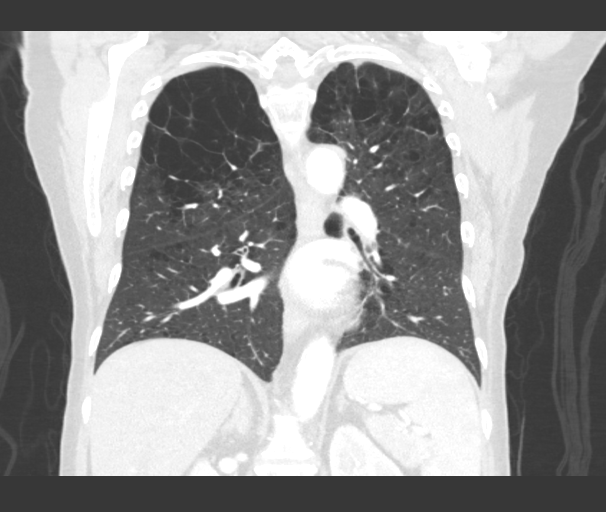

[15 of 36 positions shown; findings below may reference images not displayed]

FINDINGS: Cardiovascular: The heart size is normal. No pericardial effusion.
Coronary artery calcification is noted. Atherosclerotic
calcification is noted in the wall of the thoracic aorta.

Mediastinum/Nodes: 2.5 cm short axis right paratracheal lymph node
decreased a 0.8 cm on the current study. 2.5 cm precarinal lymph
node seen on the prior study has decreased to 1.4 cm short axis
today. Stable 9 mm short axis right hilar lymph node. The esophagus
has normal imaging features. 5 mm left thyroid nodule not likely
clinically relevant in this individual. There is no axillary
lymphadenopathy.

Lungs/Pleura: Marked emphysema with upper lobe bullous change again
noted. No focal airspace consolidation. No pulmonary edema or
pleural effusion. No suspicious pulmonary nodule or mass.

Upper Abdomen: 1.6 cm low-density lesion interpolar right kidney
stable back to CT scan from 03/09/2016 and not visualized on
03/15/2016. No adrenal nodule or mass.

Musculoskeletal: Bone windows reveal no worrisome lytic or sclerotic
osseous lesions.
IMPRESSION: 1. Interval decrease in mediastinal lymphadenopathy.
2. Coronary artery and thoracoabdominal aortic atherosclerosis.
3. Emphysema with upper lobe bullous change bilaterally.

## 2018-08-10 ENCOUNTER — Encounter: Payer: Self-pay | Admitting: Internal Medicine

## 2018-08-10 ENCOUNTER — Ambulatory Visit: Payer: Self-pay | Admitting: Pulmonary Disease

## 2018-08-11 ENCOUNTER — Other Ambulatory Visit: Payer: Self-pay | Admitting: Radiation Therapy

## 2018-08-12 ENCOUNTER — Telehealth: Payer: Self-pay | Admitting: Internal Medicine

## 2018-08-12 NOTE — Telephone Encounter (Signed)
Called patient per sch msg to inform of change in time for lab for 3/30. Left message

## 2018-08-17 ENCOUNTER — Other Ambulatory Visit: Payer: Self-pay | Admitting: Family Medicine

## 2018-08-17 ENCOUNTER — Telehealth: Payer: Self-pay | Admitting: Medical Oncology

## 2018-08-17 DIAGNOSIS — Z1231 Encounter for screening mammogram for malignant neoplasm of breast: Secondary | ICD-10-CM

## 2018-08-17 NOTE — Telephone Encounter (Signed)
"  Pain all over" can I get PET scan?  Weak , "2 weeks ago she fell after she got dizzy. Way off balance I stagger like I am drunk" She acknowledges she drives.  I instructed her to see PCP. Left breast hurts and right ovary hurts"  3/31 appt for  [facility], 3/19 PAP smear Dr Hulan Fray.

## 2018-08-17 NOTE — Telephone Encounter (Signed)
No indication to do a PET scan unless we see something concerning on CT scan.

## 2018-08-18 ENCOUNTER — Other Ambulatory Visit: Payer: Self-pay

## 2018-08-20 ENCOUNTER — Ambulatory Visit: Payer: Self-pay | Admitting: Obstetrics & Gynecology

## 2018-08-25 NOTE — Telephone Encounter (Signed)
Pt.notified

## 2018-08-27 ENCOUNTER — Other Ambulatory Visit: Payer: Self-pay | Admitting: Medical Oncology

## 2018-08-27 DIAGNOSIS — C3491 Malignant neoplasm of unspecified part of right bronchus or lung: Secondary | ICD-10-CM
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
## 2018-08-31 ENCOUNTER — Ambulatory Visit (HOSPITAL_COMMUNITY)
Admission: RE | Admit: 2018-08-31 | Discharge: 2018-08-31 | Disposition: A | Discharge status: Home / Self Care | Payer: Medicare Other | Source: Ambulatory Visit | Attending: Internal Medicine | Admitting: Internal Medicine

## 2018-08-31 ENCOUNTER — Other Ambulatory Visit: Payer: Self-pay

## 2018-08-31 ENCOUNTER — Inpatient Hospital Stay: Payer: Medicare Other | Attending: Internal Medicine

## 2018-08-31 ENCOUNTER — Encounter (HOSPITAL_COMMUNITY): Payer: Self-pay

## 2018-08-31 DIAGNOSIS — C3490 Malignant neoplasm of unspecified part of unspecified bronchus or lung: Secondary | ICD-10-CM | POA: Diagnosis not present

## 2018-08-31 DIAGNOSIS — C3491 Malignant neoplasm of unspecified part of right bronchus or lung: Secondary | ICD-10-CM | POA: Insufficient documentation

## 2018-08-31 LAB — CMP (CANCER CENTER ONLY)
ALT: 10 U/L (ref 0–44)
AST: 13 U/L — ABNORMAL LOW (ref 15–41)
Albumin: 3.9 g/dL (ref 3.5–5.0)
Alkaline Phosphatase: 61 U/L (ref 38–126)
Anion gap: 8 (ref 5–15)
BUN: 9 mg/dL (ref 8–23)
CO2: 26 mmol/L (ref 22–32)
Calcium: 9.5 mg/dL (ref 8.9–10.3)
Chloride: 106 mmol/L (ref 98–111)
Creatinine: 0.66 mg/dL (ref 0.44–1.00)
GFR, Est AFR Am: >60 mL/min (ref >60)
GFR, Estimated: >60 mL/min (ref >60)
Glucose, Bld: 88 mg/dL (ref 70–99)
Potassium: 4.3 mmol/L (ref 3.5–5.1)
Sodium: 140 mmol/L (ref 135–145)
Total Bilirubin: 0.4 mg/dL (ref 0.3–1.2)
Total Protein: 6.4 g/dL — ABNORMAL LOW (ref 6.5–8.1)

## 2018-08-31 LAB — CBC WITH DIFFERENTIAL (CANCER CENTER ONLY)
Abs Immature Granulocytes: 0 10*3/uL (ref 0.00–0.07)
Basophils Absolute: 0 10*3/uL (ref 0.0–0.1)
Basophils Relative: 0 %
Eosinophils Absolute: 0 10*3/uL (ref 0.0–0.5)
Eosinophils Relative: 1 %
HCT: 42.2 % (ref 36.0–46.0)
Hemoglobin: 13.7 g/dL (ref 12.0–15.0)
Immature Granulocytes: 0 %
Lymphocytes Relative: 12 %
Lymphs Abs: 0.6 10*3/uL — ABNORMAL LOW (ref 0.7–4.0)
MCH: 30.6 pg (ref 26.0–34.0)
MCHC: 32.5 g/dL (ref 30.0–36.0)
MCV: 94.4 fL (ref 80.0–100.0)
Monocytes Absolute: 0.3 10*3/uL (ref 0.1–1.0)
Monocytes Relative: 6 %
Neutro Abs: 4.3 10*3/uL (ref 1.7–7.7)
Neutrophils Relative %: 81 %
Platelet Count: 145 10*3/uL — ABNORMAL LOW (ref 150–400)
RBC: 4.47 MIL/uL (ref 3.87–5.11)
RDW: 13.1 % (ref 11.5–15.5)
WBC Count: 5.2 10*3/uL (ref 4.0–10.5)
nRBC: 0 % (ref 0.0–0.2)

## 2018-08-31 MED ORDER — IOHEXOL 300 MG/ML  SOLN
75.0000 mL | Freq: Once | INTRAMUSCULAR | Status: AC | PRN
  Administered 2018-08-31: 75 mL via INTRAVENOUS

## 2018-08-31 MED ORDER — SODIUM CHLORIDE (PF) 0.9 % IJ SOLN
INTRAMUSCULAR | Status: AC
  Filled 2018-08-31: qty 50

## 2018-09-01 ENCOUNTER — Encounter: Payer: Self-pay | Admitting: Internal Medicine

## 2018-09-01 ENCOUNTER — Ambulatory Visit: Payer: Self-pay

## 2018-09-02 ENCOUNTER — Telehealth: Payer: Self-pay | Admitting: Medical Oncology

## 2018-09-02 NOTE — Telephone Encounter (Signed)
Virtual visit set up. 

## 2018-09-03 ENCOUNTER — Inpatient Hospital Stay: Payer: Medicare Other | Attending: Internal Medicine | Admitting: Internal Medicine

## 2018-09-03 DIAGNOSIS — Z5111 Encounter for antineoplastic chemotherapy: Secondary | ICD-10-CM

## 2018-09-03 DIAGNOSIS — C3491 Malignant neoplasm of unspecified part of right bronchus or lung: Secondary | ICD-10-CM | POA: Diagnosis not present
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
## 2018-09-08 ENCOUNTER — Ambulatory Visit: Payer: Self-pay | Admitting: Pulmonary Disease

## 2018-09-08 ENCOUNTER — Telehealth: Payer: Self-pay | Admitting: Internal Medicine

## 2018-09-08 ENCOUNTER — Encounter: Payer: Self-pay | Admitting: Internal Medicine

## 2018-09-08 NOTE — Telephone Encounter (Signed)
Scheduled per sch msg. Mailed printout.

## 2018-09-12 IMAGING — CR DG CHEST 2V
2 series · 2 of 2 positions shown · non-contrast
Comparison: CT 04/11/2016.  Chest x-ray 03/30/2016.

CLINICAL DATA: Productive cough.

EXAM:
CHEST  2 VIEW

[w chest pa]
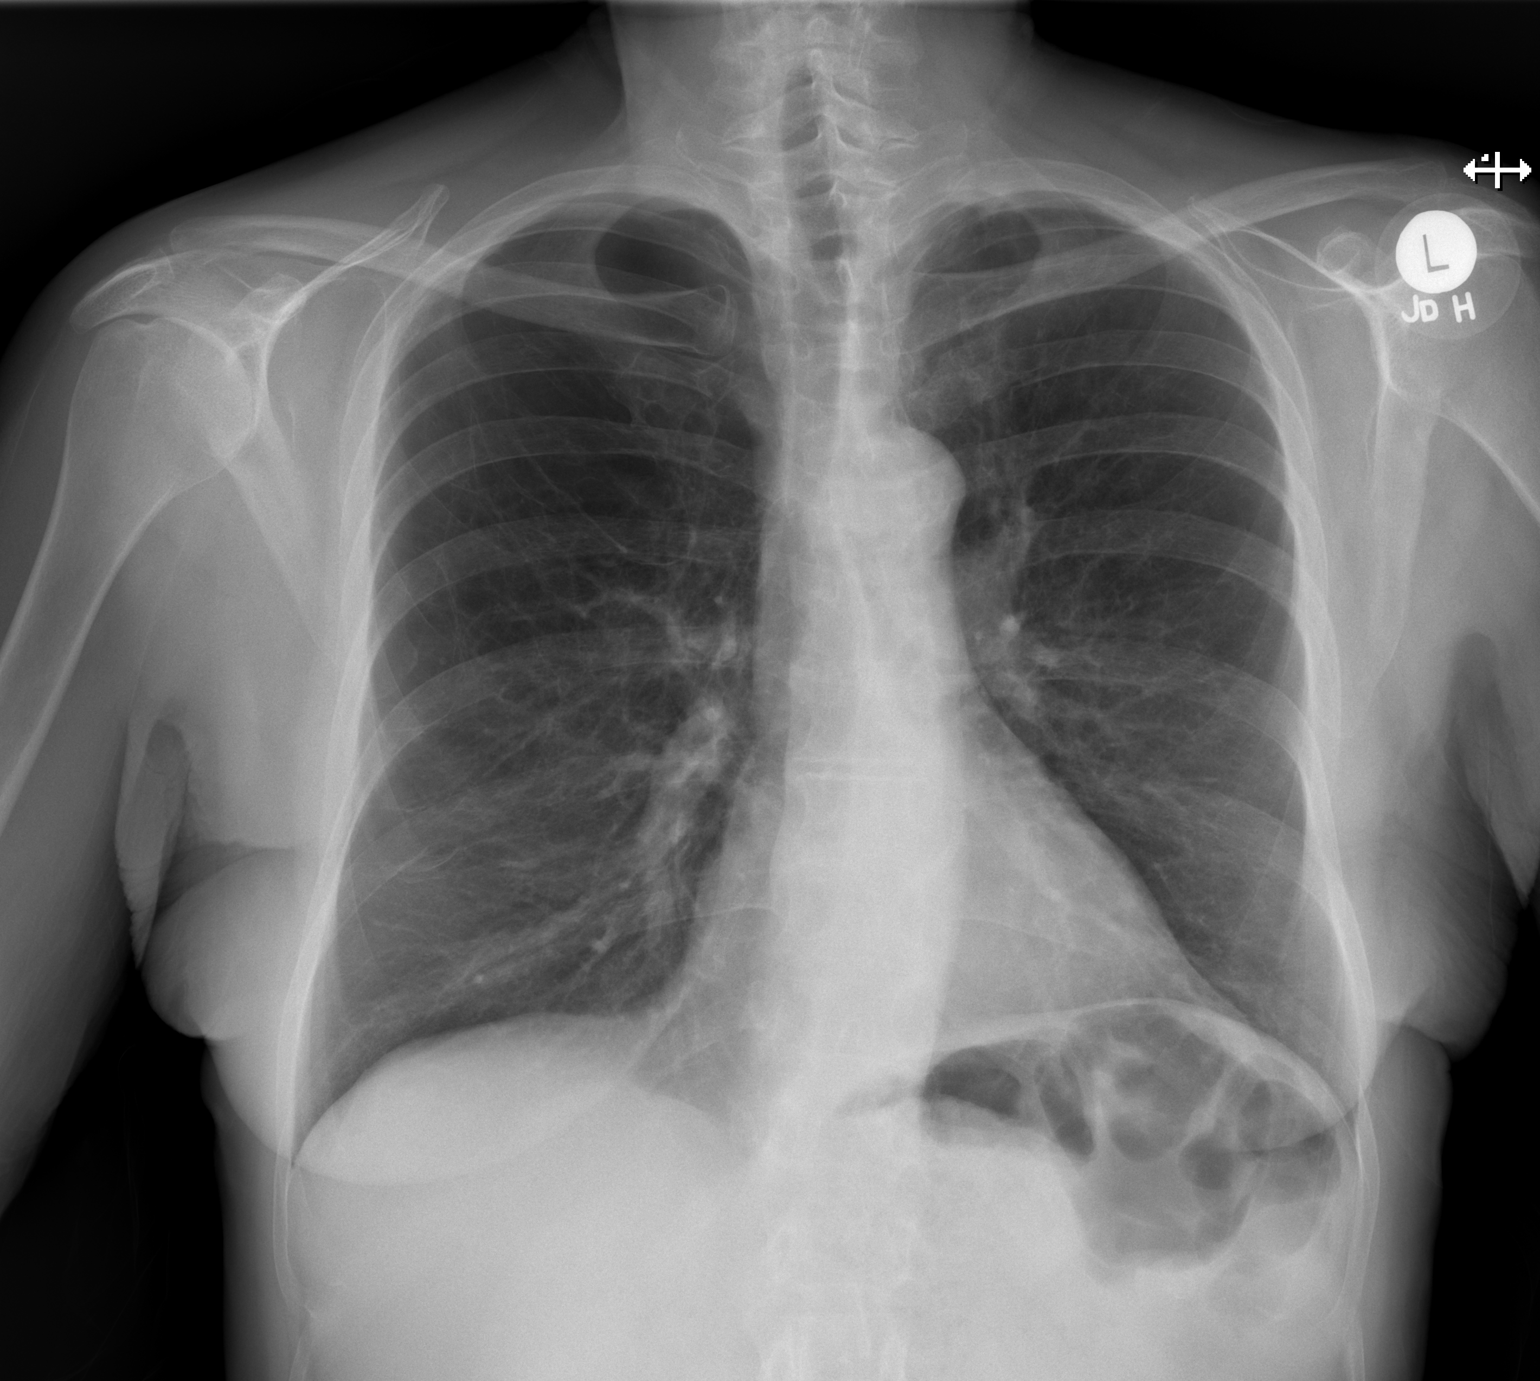

[w chest lat]
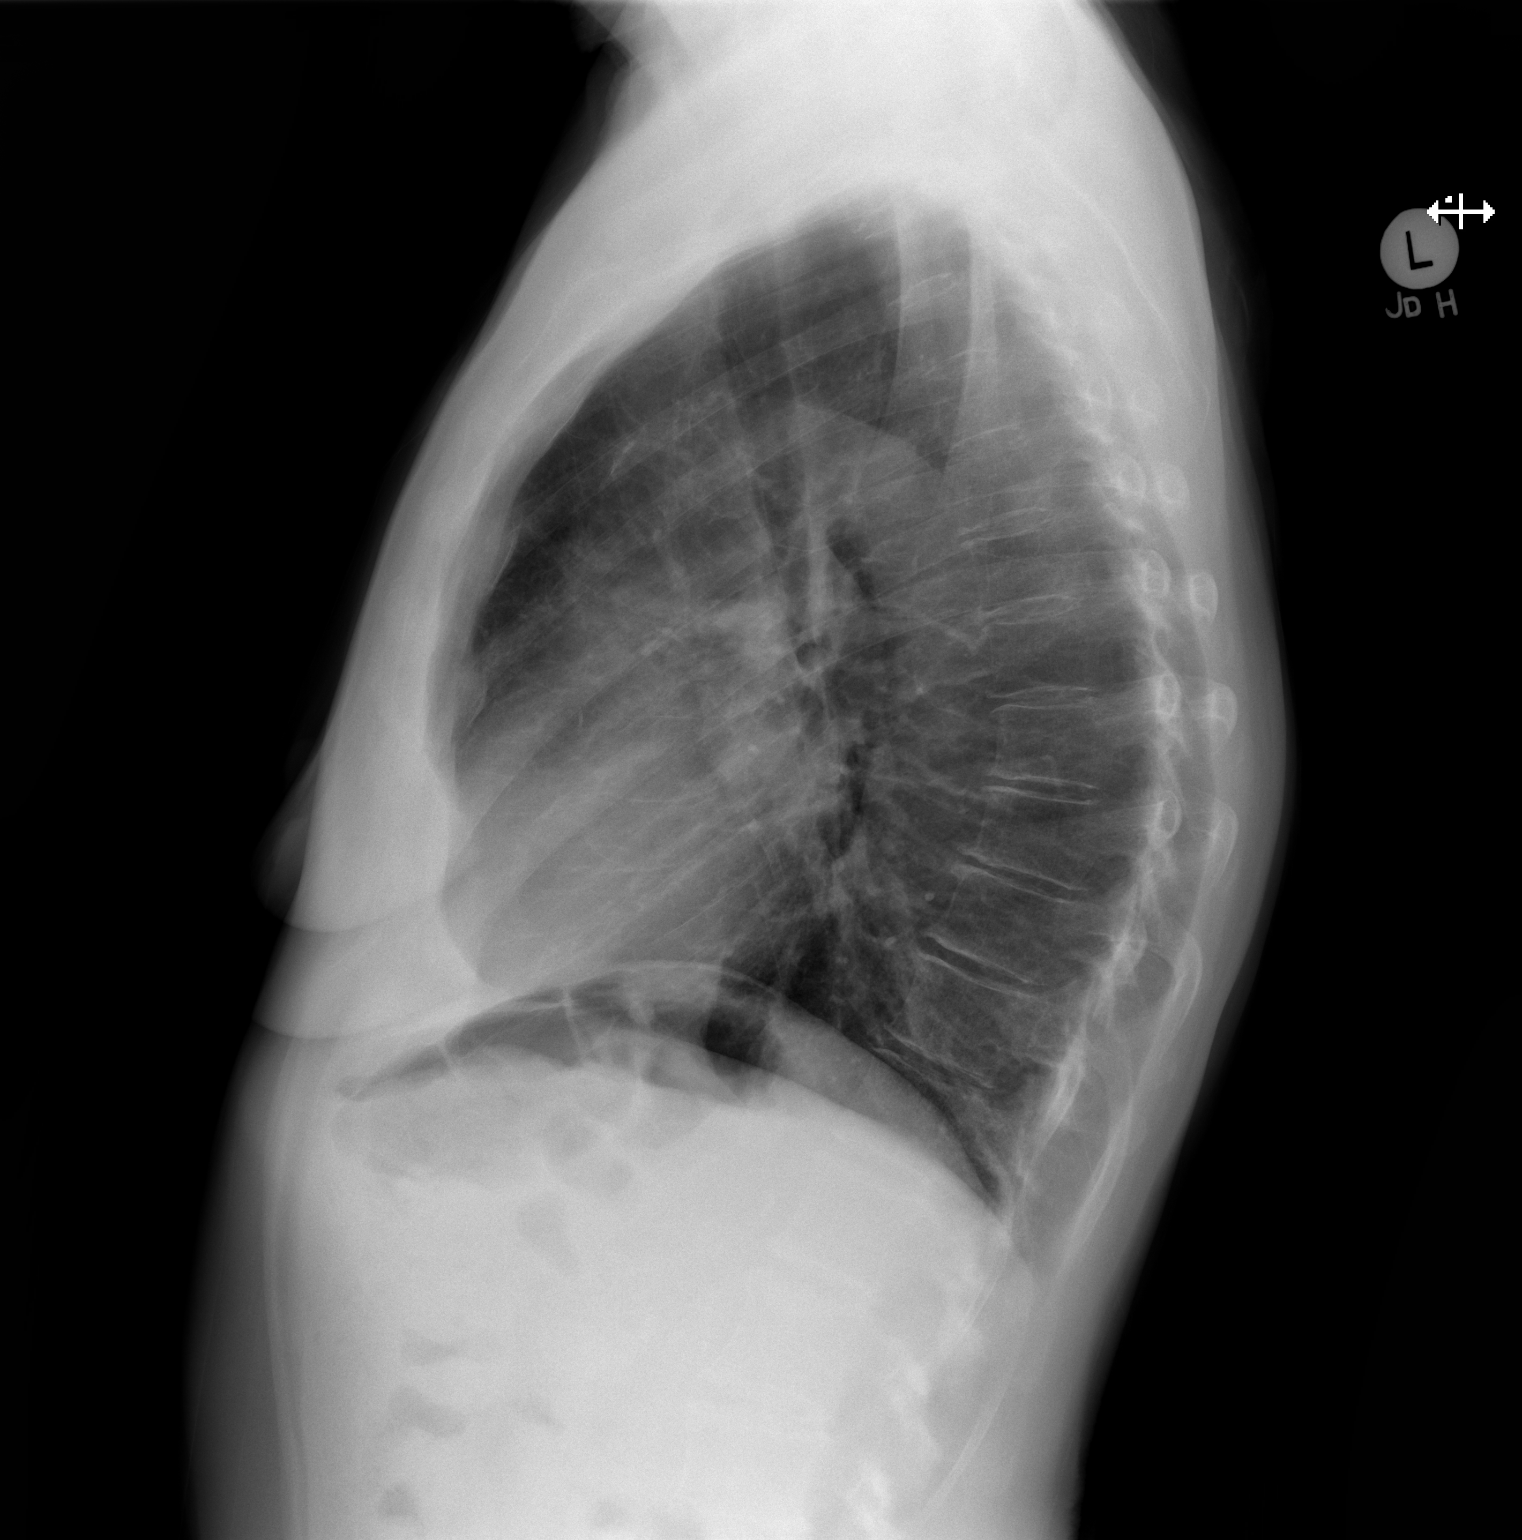

[2 of 2 positions shown; findings below may reference images not displayed]

FINDINGS: Mediastinum hilar structures normal. Heart size normal. Mild
bibasilar subsegmental atelectasis. Bullous COPD. No acute bony
abnormality identified .
IMPRESSION: Bullous COPD and mild bibasilar subsegmental atelectasis.

## 2018-09-17 IMAGING — CT CT CHEST W/ CM
5 of 8 series · 17 of 36 positions shown, 18 images · IV contrast (ISOVUE 300)
Comparison: 05/15/2016 chest radiograph.  CT 04/11/2016.

CLINICAL DATA: Small cell carcinoma of right lung. Restaging.
Chemotherapy and radiation therapy complete. Cough for 2 weeks.

EXAM:
CT CHEST WITH CONTRAST
TECHNIQUE: Multidetector CT imaging of the chest was performed during
intravenous contrast administration.
CONTRAST:  100mL [id number] IOPAMIDOL ([id number]) INJECTION 61%

[Series 5: axial st · axial · 0.68mm/px · z∈[-408,-298]mm · 2 of 67 slices shown, 3 images]
[im 23/67  mediastinal]
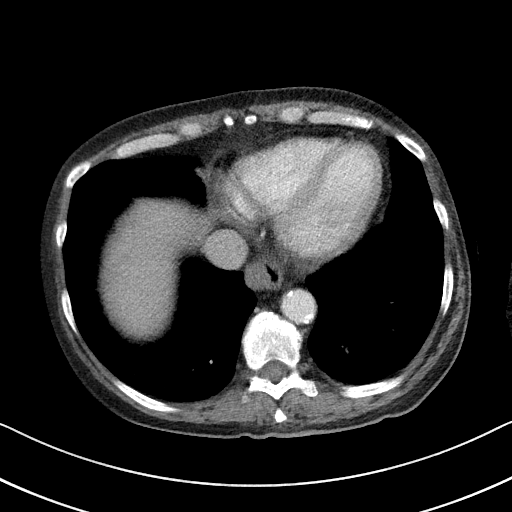
[im 23/67  lung]
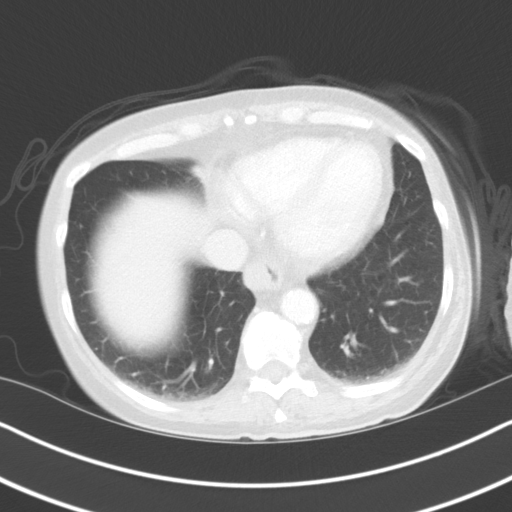
[im 45/67  lung]
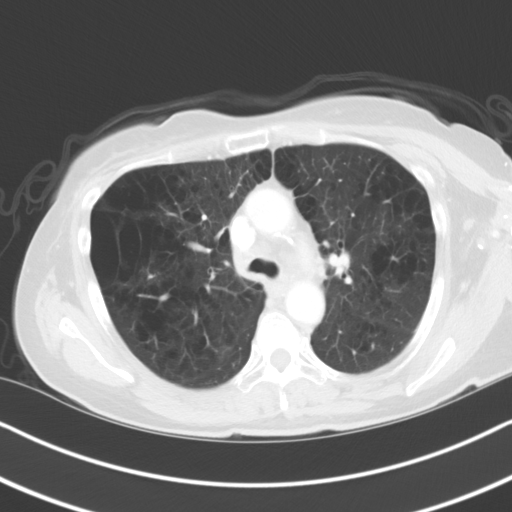

[Series 6: lungs · axial · 0.68mm/px · z∈[-426,-228]mm · 6 of 139 slices shown]
[im 20/139  lung]
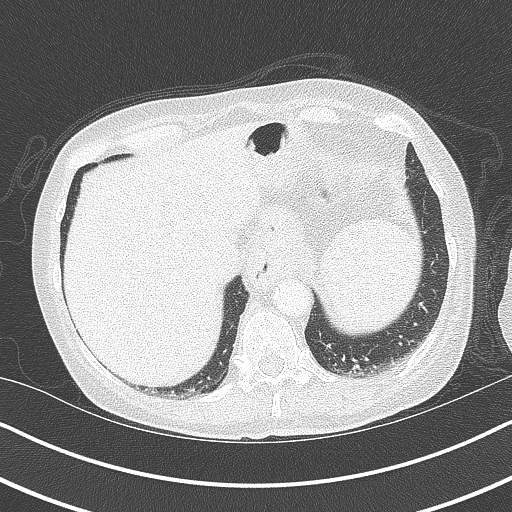
[im 40/139  lung]
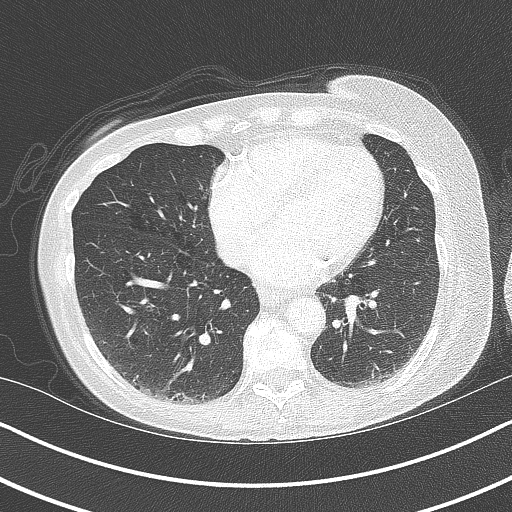
[im 60/139  lung]
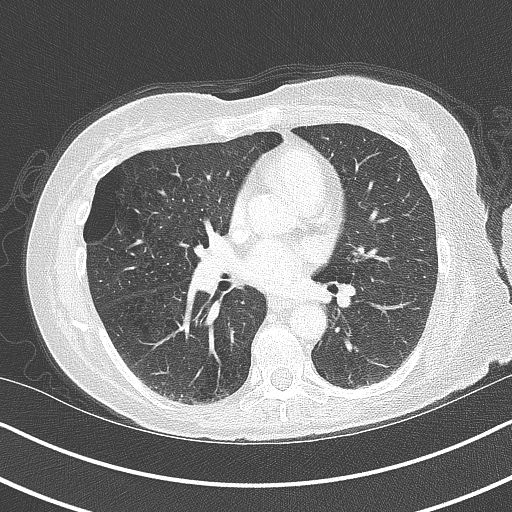
[im 79/139  lung]
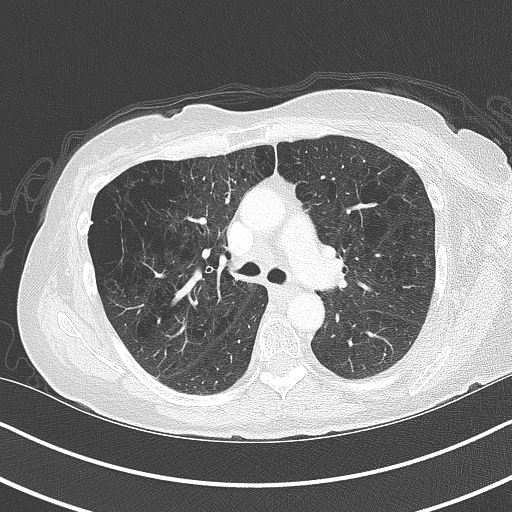
[im 99/139  lung]
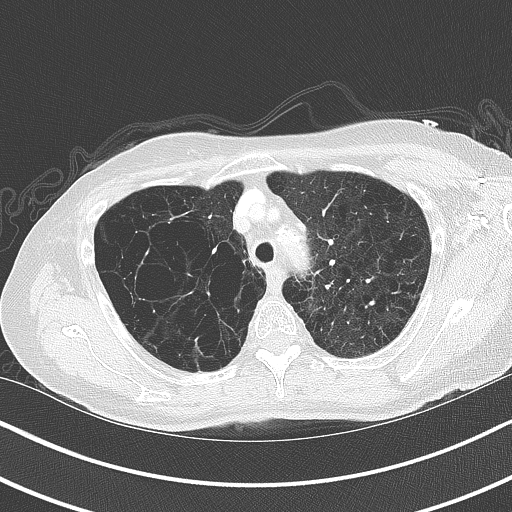
[im 119/139  lung]
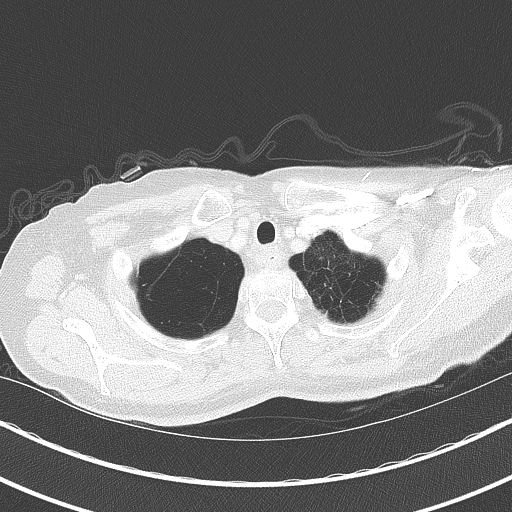

[Series 7: coronal · coronal · 0.65mm/px · 1 of 137 slices shown]
[im 69/137  lung]
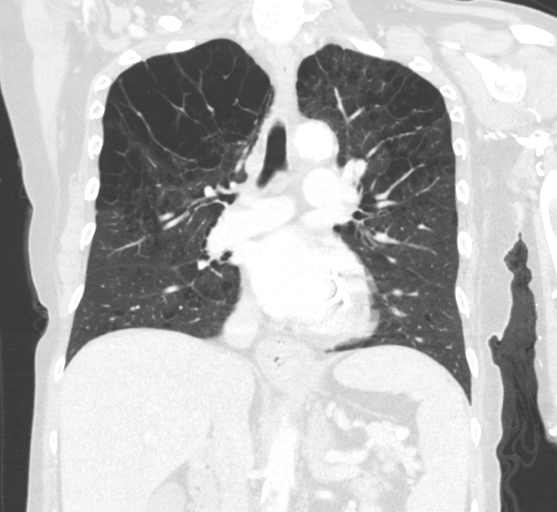

[Series 9: axial neck · axial · 0.47mm/px · z∈[-250,-70]mm · 6 of 127 slices shown]
[im 19/127  lung]
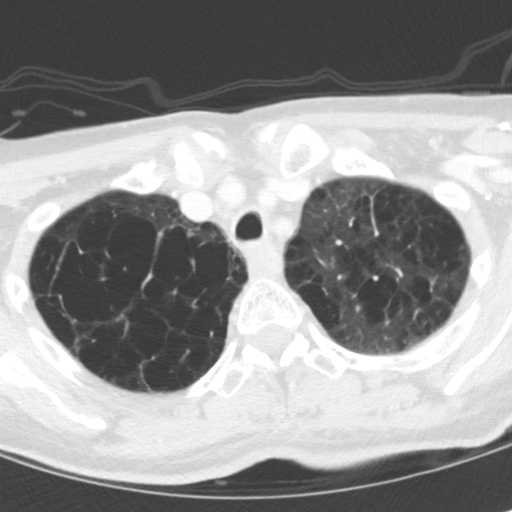
[im 37/127  lung]
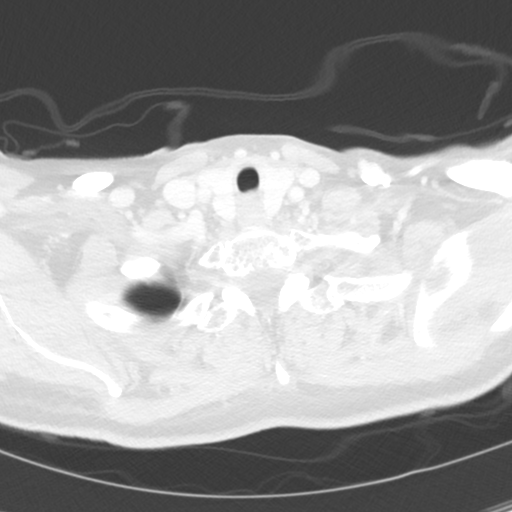
[im 55/127  lung]
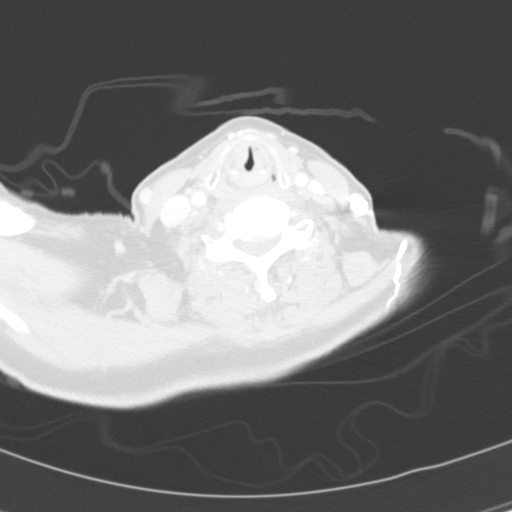
[im 73/127  lung]
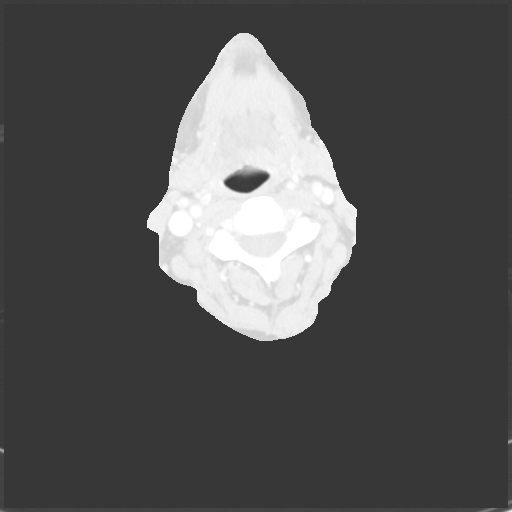
[im 91/127  lung]
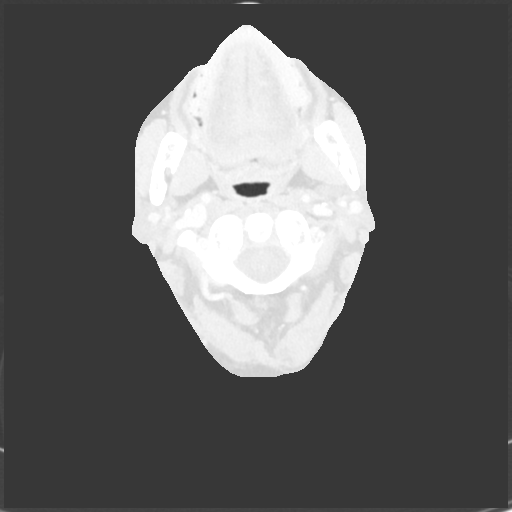
[im 109/127  lung]
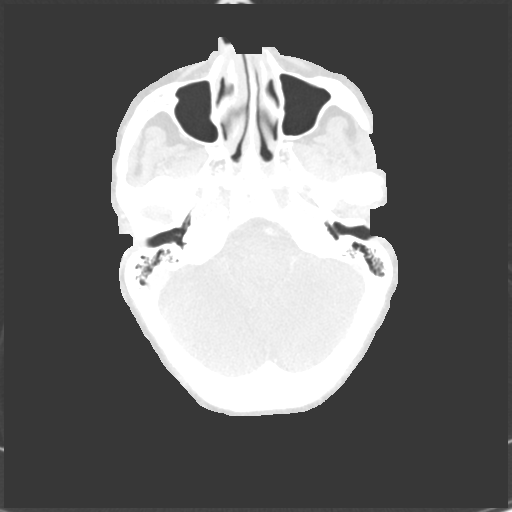

[Series 11: orthogonal ax · axial · 0.39mm/px · z∈[-250,-214]mm · 2 of 125 slices shown]
[im 18/125  lung]
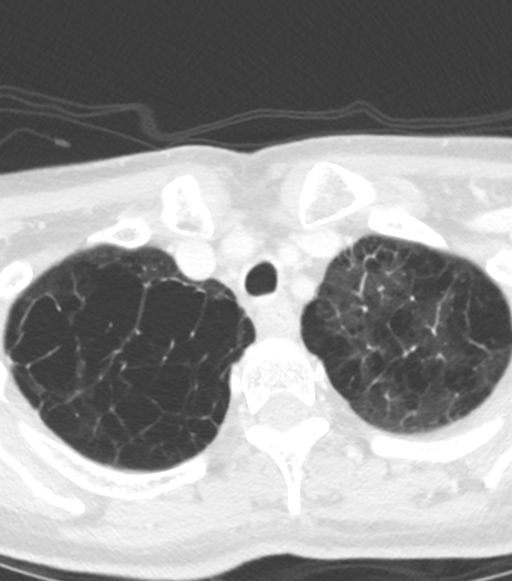
[im 36/125  lung]
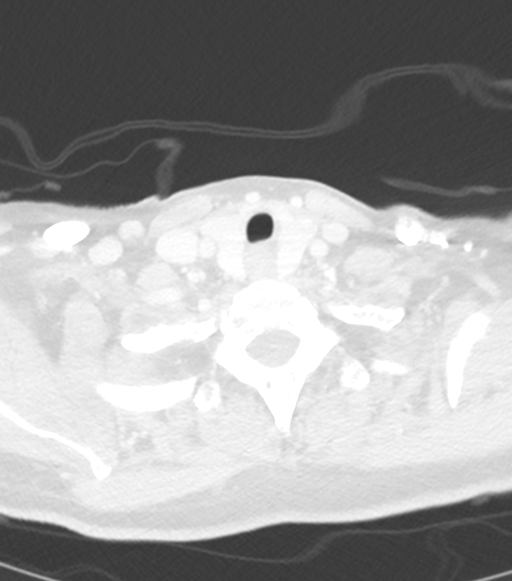

[17 of 36 positions shown; findings below may reference images not displayed]

FINDINGS: Cardiovascular: Aortic and branch vessel atherosclerosis. Tortuous
thoracic aorta. Normal heart size, without pericardial effusion.
Multivessel coronary artery atherosclerosis. No central pulmonary
embolism, on this non-dedicated study.

Mediastinum/Nodes: No supraclavicular adenopathy. Precarinal node
measures 1.3 x 1.3 cm on image 24/series 5. Compare 1.4 x 1.9 cm on
the prior exam.

Right paratracheal node measures 8 mm on image 19/series 5 and is
unchanged. No hilar adenopathy. Small hiatal hernia.

Lungs/Pleura: No pleural fluid.  advanced bullous type emphysema.

Right upper lobe nodularity, including at 4 mm on image 77/series 6.
Similar back to 02/09/2016.

Upper Abdomen: Normal imaged portions of the liver, spleen,
pancreas, gallbladder, biliary tract, adrenal glands, left kidney.
An interpolar right renal lesion th similar in size measures 1.5 cm
on image 61/series 5. Fluid density on coronal reformats. Advanced
abdominal aortic atherosclerosis.

Musculoskeletal: No acute osseous abnormality.
IMPRESSION: 1. Minimal improvement in thoracic adenopathy.
2. No new or progressive disease.
3. Age advanced coronary artery atherosclerosis. Recommend
assessment of coronary risk factors and consideration of medical
therapy. Aortic atherosclerosis.

## 2018-09-24 DIAGNOSIS — L821 Other seborrheic keratosis: Secondary | ICD-10-CM | POA: Diagnosis not present

## 2018-09-28 ENCOUNTER — Ambulatory Visit
Admission: RE | Admit: 2018-09-28 | Discharge: 2018-09-28 | Disposition: A | Discharge status: Home / Self Care | Payer: Medicare Other | Source: Ambulatory Visit | Attending: Radiation Oncology | Admitting: Radiation Oncology

## 2018-09-28 ENCOUNTER — Other Ambulatory Visit: Payer: Self-pay

## 2018-09-28 DIAGNOSIS — C7949 Secondary malignant neoplasm of other parts of nervous system: Secondary | ICD-10-CM | POA: Diagnosis not present

## 2018-09-28 DIAGNOSIS — C7931 Secondary malignant neoplasm of brain: Secondary | ICD-10-CM

## 2018-09-28 MED ORDER — GADOBENATE DIMEGLUMINE 529 MG/ML IV SOLN
10.0000 mL | Freq: Once | INTRAVENOUS | Status: AC | PRN
  Administered 2018-09-28: 10 mL via INTRAVENOUS

## 2018-09-29 ENCOUNTER — Telehealth: Payer: Self-pay

## 2018-09-29 NOTE — Telephone Encounter (Signed)
I spoke with Ms. Michele [name] today. Dr. Isidore Moos requested I call her and let her know that her recent MRI showed no cancer in the brain. The MRI showed no changes from her last MRI. Ms. Michele [name] voiced her appreciation of the phone call and knows to call me if she has any further questions or concerns. Michele [name] will be scheduling her next MRI brain in 6 months with a follow up visit with Dr. Isidore Moos afterwards for results.

## 2018-09-30 ENCOUNTER — Ambulatory Visit: Payer: Self-pay | Admitting: Radiation Oncology

## 2018-10-09 ENCOUNTER — Telehealth: Payer: Self-pay | Admitting: Gastroenterology

## 2018-10-09 NOTE — Telephone Encounter (Signed)
The pt has constipation with very hard stool.  Las BM this morning but very hard.  She has been on fiber and stool softener.  She was advised to do a miralax purge today and continue 2 doses of miralax daily and titrate as needed.  She will call with an update if she does not get relief.

## 2018-10-10 ENCOUNTER — Telehealth: Payer: Self-pay | Admitting: Nurse Practitioner

## 2018-10-10 NOTE — Telephone Encounter (Signed)
Patient called answering service today. I spoke to pt at 12pm. She c/o having difficulty passing a BM, stool feels as if it gets stuck in the rectum. She is having some generalized abdominal pain. No fever. She was given Miralax instructions by our office yesterday but not sure how to take Miralax. I advised she take Miralax 1 capful mixed in 8 ounces of water now and repeat in 1 hour, if no BM then repeat Miralax at 8pm and 9pm tonight. If abdominal pain worsens then advised to go to [facility] ER. She will call answering service in tomorrow if no BM.

## 2018-10-14 ENCOUNTER — Ambulatory Visit (INDEPENDENT_AMBULATORY_CARE_PROVIDER_SITE_OTHER): Payer: Medicare Other | Admitting: Gastroenterology

## 2018-10-14 ENCOUNTER — Other Ambulatory Visit: Payer: Self-pay

## 2018-10-14 ENCOUNTER — Telehealth: Payer: Self-pay

## 2018-10-14 ENCOUNTER — Encounter: Payer: Self-pay | Admitting: Gastroenterology

## 2018-10-14 VITALS — Ht 65.0 in | Wt 120.0 lb

## 2018-10-14 DIAGNOSIS — R1310 Dysphagia, unspecified: Secondary | ICD-10-CM

## 2018-10-14 DIAGNOSIS — K5900 Constipation, unspecified: Secondary | ICD-10-CM | POA: Diagnosis not present

## 2018-10-14 DIAGNOSIS — K229 Disease of esophagus, unspecified: Secondary | ICD-10-CM | POA: Diagnosis not present

## 2018-10-14 DIAGNOSIS — I208 Other forms of angina pectoris: Secondary | ICD-10-CM | POA: Diagnosis not present

## 2018-10-14 NOTE — Patient Instructions (Addendum)
  She knows that she needs to start taking Citrucel orange flavored powder fiber supplement on a daily basis.  She knows also to continue taking her MiraLAX 1 dose once daily.  This ought to help with her constipation, bloating and possibly her minor rectal bleeding that is likely hemorrhoid related.  We will arrange an EGD at [facility], my next available appointment, to examine her esophagus and likely dilate for previously known peptic stricture and dysphasia.  We will make sure that her cardiologist is okay with her holding Plavix for 5 days before that procedure.

## 2018-10-14 NOTE — Telephone Encounter (Signed)
Yes - fine to hold  Michele Hew, MD

## 2018-10-14 NOTE — Telephone Encounter (Signed)
Dr. [name], [place] to hold plavix for EGD? No recent PCI.

## 2018-10-14 NOTE — Telephone Encounter (Signed)
Pt said she is returning your call about holding her Plavix
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
## 2018-10-14 NOTE — Telephone Encounter (Signed)
 [facility] Pre-operative Risk Assessment     Request for surgical clearance:     Endoscopy Procedure  What type of surgery is being performed?     EGD  When is this surgery scheduled?     10/29/18  What type of clearance is required ?   Pharmacy  Are there any medications that need to be held prior to surgery and how long? Plavix  Practice name and name of physician performing surgery?      [facility]  What is your office phone and fax number?      Phone- [phone]  [phone]  Anesthesia type (None, local, MAC, general) ?       MAC
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
## 2018-10-15 ENCOUNTER — Other Ambulatory Visit: Payer: Self-pay | Admitting: Pulmonary Disease

## 2018-10-15 DIAGNOSIS — J439 Emphysema, unspecified: Secondary | ICD-10-CM

## 2018-10-15 NOTE — Telephone Encounter (Signed)
   Primary Cardiologist: Glenetta Hew, MD  Chart reviewed as part of pre-operative protocol coverage. Patient was contacted 10/15/2018 in reference to pre-operative risk assessment for pending surgery as outlined below.  Michele [name] was last seen on 04/17/18 by Almyra Deforest, PA.  Since that day, Michele [name] has done well from a cardiac standpoint. Today she has multiple complaints of palpitations, which has been long standing, and easy bruising but nothing that should keep her from this low risk procedure. I will have scheduling arrange for her routine follow up to address her issues.  This does not need to occur prior to her procedure.   Therefore, based on ACC/AHA guidelines, the patient would be at acceptable risk for the planned procedure without further cardiovascular testing.   OK to hold Plavix for 5 days prior to her EGD on 10/29/2018.   I will route this recommendation to the requesting party via Epic fax function and remove from pre-op pool.  Please call with questions.  Daune Perch, NP 10/15/2018, 10:14 AM

## 2018-10-15 NOTE — Telephone Encounter (Signed)
Spoke to patient. Informed her that she has been cleared to hold her plavix for 5 days prior to her EGD on 10/29/18. She voiced understanding.

## 2018-10-16 ENCOUNTER — Other Ambulatory Visit: Payer: Self-pay | Admitting: Radiation Therapy

## 2018-10-16 DIAGNOSIS — C7949 Secondary malignant neoplasm of other parts of nervous system: Secondary | ICD-10-CM

## 2018-10-16 DIAGNOSIS — C7931 Secondary malignant neoplasm of brain: Secondary | ICD-10-CM

## 2018-10-19 ENCOUNTER — Ambulatory Visit: Payer: Self-pay

## 2018-10-19 DIAGNOSIS — N644 Mastodynia: Secondary | ICD-10-CM | POA: Diagnosis not present

## 2018-10-21 ENCOUNTER — Telehealth: Payer: Self-pay | Admitting: Radiation Therapy

## 2018-10-21 ENCOUNTER — Other Ambulatory Visit: Payer: Self-pay | Admitting: Physician Assistant

## 2018-10-21 DIAGNOSIS — N644 Mastodynia: Secondary | ICD-10-CM

## 2018-10-21 NOTE — Telephone Encounter (Signed)
Spoke with Michele [name] about her November brain MRI and follow-up visit with Dr. Isidore Moos. She has written the appointment information down and was thankful for the call.  Mont Dutton R.T.(R)(T) Special Procedures Navigator

## 2018-10-23 ENCOUNTER — Other Ambulatory Visit (HOSPITAL_COMMUNITY)
Admission: RE | Admit: 2018-10-23 | Discharge: 2018-10-23 | Disposition: A | Discharge status: Home / Self Care | Payer: Medicare Other | Source: Ambulatory Visit | Attending: Gastroenterology | Admitting: Gastroenterology

## 2018-10-23 DIAGNOSIS — Z1159 Encounter for screening for other viral diseases: Secondary | ICD-10-CM | POA: Insufficient documentation

## 2018-10-23 DIAGNOSIS — Z01812 Encounter for preprocedural laboratory examination: Secondary | ICD-10-CM | POA: Diagnosis not present

## 2018-10-24 LAB — NOVEL CORONAVIRUS, NAA (HOSP ORDER, SEND-OUT TO REF LAB; TAT 18-24 HRS): SARS-CoV-2, NAA: NOT DETECTED

## 2018-10-27 ENCOUNTER — Ambulatory Visit: Payer: Self-pay

## 2018-10-28 ENCOUNTER — Encounter (HOSPITAL_COMMUNITY): Payer: Self-pay | Admitting: *Deleted

## 2018-10-28 ENCOUNTER — Other Ambulatory Visit: Payer: Self-pay

## 2018-10-28 NOTE — Progress Notes (Signed)
Spoke with Michele [name] zanetto pa and patient has cardiac clearance and is ok for endoscopy tomorrow

## 2018-10-28 NOTE — Progress Notes (Signed)
Patient contacted by phone to complete pre-op phone call. No answer.

## 2018-10-29 ENCOUNTER — Ambulatory Visit (HOSPITAL_COMMUNITY): Payer: Medicare Other | Admitting: Registered Nurse

## 2018-10-29 ENCOUNTER — Encounter (HOSPITAL_COMMUNITY): Payer: Self-pay | Admitting: *Deleted

## 2018-10-29 ENCOUNTER — Encounter (HOSPITAL_COMMUNITY)
Admission: RE | Disposition: A | Discharge status: Home / Self Care | Payer: Self-pay | Source: Home / Self Care | Attending: Gastroenterology

## 2018-10-29 ENCOUNTER — Ambulatory Visit (HOSPITAL_COMMUNITY)
Admission: RE | Admit: 2018-10-29 | Discharge: 2018-10-29 | Disposition: A | Discharge status: Home / Self Care | Payer: Medicare Other | Attending: Gastroenterology | Admitting: Gastroenterology

## 2018-10-29 DIAGNOSIS — K210 Gastro-esophageal reflux disease with esophagitis: Secondary | ICD-10-CM | POA: Diagnosis not present

## 2018-10-29 DIAGNOSIS — F419 Anxiety disorder, unspecified: Secondary | ICD-10-CM | POA: Insufficient documentation

## 2018-10-29 DIAGNOSIS — B3781 Candidal esophagitis: Secondary | ICD-10-CM | POA: Diagnosis not present

## 2018-10-29 DIAGNOSIS — J439 Emphysema, unspecified: Secondary | ICD-10-CM | POA: Insufficient documentation

## 2018-10-29 DIAGNOSIS — E785 Hyperlipidemia, unspecified: Secondary | ICD-10-CM | POA: Diagnosis not present

## 2018-10-29 DIAGNOSIS — C3491 Malignant neoplasm of unspecified part of right bronchus or lung: Secondary | ICD-10-CM | POA: Diagnosis not present

## 2018-10-29 DIAGNOSIS — R1310 Dysphagia, unspecified: Secondary | ICD-10-CM

## 2018-10-29 DIAGNOSIS — Z87891 Personal history of nicotine dependence: Secondary | ICD-10-CM | POA: Diagnosis not present

## 2018-10-29 DIAGNOSIS — Z79899 Other long term (current) drug therapy: Secondary | ICD-10-CM | POA: Diagnosis not present

## 2018-10-29 DIAGNOSIS — M479 Spondylosis, unspecified: Secondary | ICD-10-CM | POA: Insufficient documentation

## 2018-10-29 DIAGNOSIS — I129 Hypertensive chronic kidney disease with stage 1 through stage 4 chronic kidney disease, or unspecified chronic kidney disease: Secondary | ICD-10-CM | POA: Diagnosis not present

## 2018-10-29 DIAGNOSIS — K5900 Constipation, unspecified: Secondary | ICD-10-CM

## 2018-10-29 DIAGNOSIS — I2510 Atherosclerotic heart disease of native coronary artery without angina pectoris: Secondary | ICD-10-CM | POA: Diagnosis not present

## 2018-10-29 DIAGNOSIS — K5909 Other constipation: Secondary | ICD-10-CM | POA: Insufficient documentation

## 2018-10-29 DIAGNOSIS — Z7901 Long term (current) use of anticoagulants: Secondary | ICD-10-CM | POA: Diagnosis not present

## 2018-10-29 DIAGNOSIS — C3490 Malignant neoplasm of unspecified part of unspecified bronchus or lung: Secondary | ICD-10-CM | POA: Diagnosis not present

## 2018-10-29 DIAGNOSIS — E7800 Pure hypercholesterolemia, unspecified: Secondary | ICD-10-CM | POA: Diagnosis not present

## 2018-10-29 DIAGNOSIS — K449 Diaphragmatic hernia without obstruction or gangrene: Secondary | ICD-10-CM | POA: Diagnosis not present

## 2018-10-29 DIAGNOSIS — Z955 Presence of coronary angioplasty implant and graft: Secondary | ICD-10-CM | POA: Diagnosis not present

## 2018-10-29 DIAGNOSIS — K921 Melena: Secondary | ICD-10-CM | POA: Diagnosis not present

## 2018-10-29 DIAGNOSIS — Z86010 Personal history of colonic polyps: Secondary | ICD-10-CM | POA: Insufficient documentation

## 2018-10-29 DIAGNOSIS — M170 Bilateral primary osteoarthritis of knee: Secondary | ICD-10-CM | POA: Insufficient documentation

## 2018-10-29 DIAGNOSIS — I252 Old myocardial infarction: Secondary | ICD-10-CM | POA: Diagnosis not present

## 2018-10-29 DIAGNOSIS — G40909 Epilepsy, unspecified, not intractable, without status epilepticus: Secondary | ICD-10-CM | POA: Diagnosis not present

## 2018-10-29 DIAGNOSIS — K222 Esophageal obstruction: Secondary | ICD-10-CM | POA: Diagnosis not present

## 2018-10-29 DIAGNOSIS — G2581 Restless legs syndrome: Secondary | ICD-10-CM | POA: Insufficient documentation

## 2018-10-29 DIAGNOSIS — K219 Gastro-esophageal reflux disease without esophagitis: Secondary | ICD-10-CM

## 2018-10-29 DIAGNOSIS — K229 Disease of esophagus, unspecified: Secondary | ICD-10-CM

## 2018-10-29 DIAGNOSIS — J441 Chronic obstructive pulmonary disease with (acute) exacerbation: Secondary | ICD-10-CM | POA: Diagnosis not present

## 2018-10-29 HISTORY — DX: Pneumonia, unspecified organism: J18.9

## 2018-10-29 HISTORY — DX: Cough, unspecified: R05.9

## 2018-10-29 HISTORY — PX: ESOPHAGOGASTRODUODENOSCOPY (EGD) WITH PROPOFOL: SHX5813

## 2018-10-29 SURGERY — ESOPHAGOGASTRODUODENOSCOPY (EGD) WITH PROPOFOL
Anesthesia: Monitor Anesthesia Care

## 2018-10-29 MED ORDER — FLUCONAZOLE 100 MG PO TABS: 100.0000 mg | ORAL_TABLET | Freq: Every day | ORAL | 0 refills | Status: AC

## 2018-10-29 MED ORDER — PROPOFOL 10 MG/ML IV BOLUS
INTRAVENOUS | Status: AC
  Filled 2018-10-29: qty 20

## 2018-10-29 MED ORDER — PROPOFOL 10 MG/ML IV BOLUS
INTRAVENOUS | Status: DC | PRN
  Administered 2018-10-29: 20 mg via INTRAVENOUS

## 2018-10-29 MED ORDER — LACTATED RINGERS IV SOLN
INTRAVENOUS | Status: DC
  Administered 2018-10-29: 09:00:00 1000 mL via INTRAVENOUS

## 2018-10-29 MED ORDER — SODIUM CHLORIDE 0.9 % IV SOLN: INTRAVENOUS | Status: DC

## 2018-10-29 MED ORDER — ONDANSETRON HCL 4 MG/2ML IJ SOLN
INTRAMUSCULAR | Status: DC | PRN
  Administered 2018-10-29: 4 mg via INTRAVENOUS

## 2018-10-29 MED ORDER — PROPOFOL 500 MG/50ML IV EMUL
INTRAVENOUS | Status: DC | PRN
  Administered 2018-10-29: 120 ug/kg/min via INTRAVENOUS

## 2018-10-29 MED ORDER — PROPOFOL 10 MG/ML IV BOLUS
INTRAVENOUS | Status: AC
  Filled 2018-10-29: qty 40

## 2018-10-29 SURGICAL SUPPLY — 15 items

## 2018-10-29 NOTE — Anesthesia Postprocedure Evaluation (Signed)
Anesthesia Post Note  Patient: Shareena E Tetzloff  Procedure(s) Performed: ESOPHAGOGASTRODUODENOSCOPY (EGD) WITH PROPOFOL (N/A )     Patient location during evaluation: Endoscopy Anesthesia Type: MAC Level of consciousness: awake and alert Pain management: pain level controlled Vital Signs Assessment: post-procedure vital signs reviewed and stable Respiratory status: spontaneous breathing, nonlabored ventilation, respiratory function stable and patient connected to nasal cannula oxygen Cardiovascular status: blood pressure returned to baseline and stable Postop Assessment: no apparent nausea or vomiting Anesthetic complications: no    Last Vitals:  Vitals:   10/29/18 0930 10/29/18 0935  BP: (!) 99/47 (!) 109/53  Pulse: 72 75  Resp: 15 16  Temp:    SpO2: 97% 97%    Last Pain:  Vitals:   10/29/18 0935  TempSrc:   PainSc: 0-No pain                 Chelsey L Woodrum

## 2018-10-29 NOTE — Interval H&P Note (Signed)
History and Physical Interval Note:  10/29/2018 8:17 AM  Michele [name]  has presented today for surgery, with the diagnosis of peptic stricture dysphagia.  The various methods of treatment have been discussed with the patient and family. After consideration of risks, benefits and other options for treatment, the patient has consented to  Procedure(s): ESOPHAGOGASTRODUODENOSCOPY (EGD) WITH PROPOFOL (N/A) as a surgical intervention.  The patient's history has been reviewed, patient examined, no change in status, stable for surgery.  I have reviewed the patient's chart and labs.  Questions were answered to the patient's satisfaction.     Milus Banister

## 2018-10-29 NOTE — Discharge Instructions (Signed)
YOU HAD AN ENDOSCOPIC PROCEDURE TODAY: Refer to the procedure report and other information in the discharge instructions given to you for any specific questions about what was found during the examination. If this information does not answer your questions, please call [place] office at [phone] to clarify.   YOU SHOULD EXPECT: Some feelings of bloating in the abdomen. Passage of more gas than usual. Walking can help get rid of the air that was put into your GI tract during the procedure and reduce the bloating. If you had a lower endoscopy (such as a colonoscopy or flexible sigmoidoscopy) you may notice spotting of blood in your stool or on the toilet paper. Some abdominal soreness may be present for a day or two, also.  DIET: Your first meal following the procedure should be a light meal and then it is ok to progress to your normal diet. A half-sandwich or bowl of soup is an example of a good first meal. Heavy or fried foods are harder to digest and may make you feel nauseous or bloated. Drink plenty of fluids but you should avoid alcoholic beverages for 24 hours. If you had a esophageal dilation, please see attached instructions for diet.    ACTIVITY: Your care partner should take you home directly after the procedure. You should plan to take it easy, moving slowly for the rest of the day. You can resume normal activity the day after the procedure however YOU SHOULD NOT DRIVE, use power tools, machinery or perform tasks that involve climbing or major physical exertion for 24 hours (because of the sedation medicines used during the test).   SYMPTOMS TO REPORT IMMEDIATELY: A gastroenterologist can be reached at any hour. Please call [phone]  for any of the following symptoms:   Following upper endoscopy (EGD, EUS, ERCP, esophageal dilation) Vomiting of blood or coffee ground material  New, significant abdominal pain  New, significant chest pain or pain under the shoulder blades  Painful or  persistently difficult swallowing  New shortness of breath  Black, tarry-looking or red, bloody stools  FOLLOW UP:  If any biopsies were taken you will be contacted by phone or by letter within the next 1-3 weeks. Call [phone]  if you have not heard about the biopsies in 3 weeks.  Please also call with any specific questions about appointments or follow up tests.  

## 2018-10-29 NOTE — Transfer of Care (Signed)
Immediate Anesthesia Transfer of Care Note  Patient: Michele [name]  Procedure(s) Performed: ESOPHAGOGASTRODUODENOSCOPY (EGD) WITH PROPOFOL (N/A )  Patient Location: PACU and Endoscopy Unit  Anesthesia Type:MAC  Level of Consciousness: awake, alert , oriented and patient cooperative  Airway & Oxygen Therapy: Patient Spontanous Breathing and Patient connected to nasal cannula oxygen  Post-op Assessment: Report given to RN, Post -op Vital signs reviewed and stable and Patient moving all extremities  Post vital signs: Reviewed and stable  Last Vitals:  Vitals Value Taken Time  BP 100/52 10/29/2018  9:14 AM  Temp 36.4 C 10/29/2018  9:14 AM  Pulse 65 10/29/2018  9:14 AM  Resp 18 10/29/2018  9:14 AM  SpO2 100 % 10/29/2018  9:14 AM    Last Pain:  Vitals:   10/29/18 0914  TempSrc: Axillary  PainSc:          Complications: No apparent anesthesia complications

## 2018-10-29 NOTE — Op Note (Signed)
[facility] Patient Name: Michele [name] Procedure Date: 10/29/2018 MRN: [id number] Attending MD: Milus Banister , MD Date of Birth: Sep 17, 1955 CSN: [id number] Age: 63 Admit Type: Outpatient Procedure:                Upper GI endoscopy Indications:              Dysphagia, abnormal (thickened) esophagus on CT                            several months ago Providers:                Milus Banister, MD, Cleda Daub, RN, Marguerita Merles, Technician, Charolette Child, Technician,                            Courtney Heys. [place], CRNA Referring MD:              Medicines:                Monitored Anesthesia Care Complications:            No immediate complications. Estimated blood loss:                            None. Estimated Blood Loss:     Estimated blood loss: none. Procedure:                Pre-Anesthesia Assessment:                           - Prior to the procedure, a History and Physical                            was performed, and patient medications and                            allergies were reviewed. The patient's tolerance of                            previous anesthesia was also reviewed. The risks                            and benefits of the procedure and the sedation                            options and risks were discussed with the patient.                            All questions were answered, and informed consent                            was obtained. Prior Anticoagulants: The patient has                            taken Plavix (clopidogrel),  last dose was 5 days                            prior to procedure. ASA Grade Assessment: IV - A                            patient with severe systemic disease that is a                            constant threat to life. After reviewing the risks                            and benefits, the patient was deemed in                            satisfactory condition to undergo the  procedure.                           After obtaining informed consent, the endoscope was                            passed under direct vision. Throughout the                            procedure, the patient's blood pressure, pulse, and                            oxygen saturations were monitored continuously. The                            [id number]-H190 ([id number]) Olympus gastroscope was                            introduced through the mouth, and advanced to the                            second part of duodenum. The upper GI endoscopy was                            accomplished without difficulty. The patient                            tolerated the procedure well. Scope In: Scope Out: Findings:      Multiple white exudative lesions throughout the esophagus, diagnostic of       Candida Esophagitis.      A medium-sized hiatal hernia was present.      The exam was otherwise without abnormality. Impression:               - Candida esophagitis (yeast infection of the                            esophagus). This is the cause of her dysphagia and  thickened appearing esophagus on CT scan.                           - Medium-sized hiatal hernia. Moderate Sedation:      Not Applicable - Patient had care per Anesthesia. Recommendation:           - Patient has a contact number available for                            emergencies. The signs and symptoms of potential                            delayed complications were discussed with the                            patient. Return to normal activities tomorrow.                            Written discharge instructions were provided to the                            patient.                           - Resume previous diet.                           - Please start diflucan (100mg  pills, one pill once                            daily for 10 days, script was called in today)                           - Ok to resume your plavix  today. Procedure Code(s):        --- Professional ---                           [phone], Esophagogastroduodenoscopy, flexible,                            transoral; diagnostic, including collection of                            specimen(s) by brushing or washing, when performed                            (separate procedure) Diagnosis Code(s):        --- Professional ---                           K21.0, Gastro-esophageal reflux disease with                            esophagitis  K44.9, Diaphragmatic hernia without obstruction or                            gangrene                           R13.10, Dysphagia, unspecified CPT copyright 2019 [employer]. All rights reserved. The codes documented in this report are preliminary and upon coder review may  be revised to meet current compliance requirements. Milus Banister, MD 10/29/2018 9:10:18 AM This report has been signed electronically. Number of Addenda: 0

## 2018-10-29 NOTE — Anesthesia Preprocedure Evaluation (Addendum)
Anesthesia Evaluation  Patient identified by MRN, date of birth, ID band Patient awake    Reviewed: Allergy & Precautions, NPO status , Patient's Chart, lab work & pertinent test results  Airway Mallampati: II  TM Distance: >3 FB Neck ROM: Full    Dental no notable dental hx. (+) Edentulous Upper, Edentulous Lower   Pulmonary shortness of breath and with exertion, COPD, former smoker,  H/o small cell lung carcinoma   Pulmonary exam normal breath sounds clear to auscultation       Cardiovascular hypertension, + angina + CAD, + Past MI and + Cardiac Stents  Normal cardiovascular exam Rhythm:Regular Rate:Normal  5/'13: Inflat STEMI - PCI to Cx-OM 6/'13: Staged PCI to mRCA, ~50% distal RCA lesion 6/'14: RCA stent patent, ISR of dCx stent --> bifurcation PCI - new stent  Myoview ST 10/'13 & 11/'14: Inferolateral Scar, no ischemia  Cath 02/2013: Patent Cx-OM3-AVg stents & RCA stent, mild dRCA & LAD dz; 9/'15: OM3-AVG Cx ~sub-CTO -Med Rx   TTE 2019 - Left ventricle: The cavity size was normal. Wall thickness was normal. Systolic function was normal. The estimated ejection fraction was in the range of 60% to 65%. Wall motion was normal; there were no regional wall motion abnormalities. Doppler parameters are consistent with abnormal left ventricular relaxation (grade 1 diastolic dysfunction). - Mitral valve: There was mild regurgitation. - Tricuspid valve: There was mild regurgitation.   Neuro/Psych Seizures -, Well Controlled,  PSYCHIATRIC DISORDERS Anxiety Depression    GI/Hepatic Neg liver ROS, hiatal hernia, GERD  Medicated,  Endo/Other  negative endocrine ROS  Renal/GU negative Renal ROS  negative genitourinary   Musculoskeletal  (+) Arthritis ,   Abdominal   Peds negative pediatric ROS (+)  Hematology negative hematology ROS (+)   Anesthesia Other Findings   Reproductive/Obstetrics negative OB ROS                            Anesthesia Physical Anesthesia Plan  ASA: III  Anesthesia Plan: MAC   Post-op Pain Management:    Induction: Intravenous  PONV Risk Score and Plan: 2 and Propofol infusion, Treatment may vary due to age or medical condition and Ondansetron  Airway Management Planned: Natural Airway and Simple Face Mask  Additional Equipment:   Intra-op Plan:   Post-operative Plan:   Informed Consent: I have reviewed the patients History and Physical, chart, labs and discussed the procedure including the risks, benefits and alternatives for the proposed anesthesia with the patient or authorized representative who has indicated his/her understanding and acceptance.     Dental advisory given  Plan Discussed with: CRNA  Anesthesia Plan Comments:         Anesthesia Quick Evaluation

## 2018-10-31 ENCOUNTER — Other Ambulatory Visit: Payer: Self-pay | Admitting: Cardiology

## 2018-11-02 ENCOUNTER — Encounter (HOSPITAL_COMMUNITY): Payer: Self-pay | Admitting: Gastroenterology

## 2018-11-02 ENCOUNTER — Telehealth: Payer: Self-pay | Admitting: Medical Oncology

## 2018-11-02 NOTE — Telephone Encounter (Signed)
Explained dx codes for CT scans and MRI.

## 2018-11-08 IMAGING — US US TRANSVAGINAL NON-OB
1 series · 15 of 25 positions shown · non-contrast
Comparison: CT of the abdomen and pelvis on 03/09/2016

CLINICAL DATA: Postmenopausal bleeding.

EXAM:
TRANSABDOMINAL AND TRANSVAGINAL ULTRASOUND OF PELVIS
TECHNIQUE: Both transabdominal and transvaginal ultrasound examinations of the
pelvis were performed. Transabdominal technique was performed for
global imaging of the pelvis including uterus, ovaries, adnexal
regions, and pelvic cul-de-sac. It was necessary to proceed with
endovaginal exam following the transabdominal exam to visualize the
endometrium and ovaries.

[Series 1: us transvaginal non-ob · 15 of 66 slices shown]
[im 1/66]
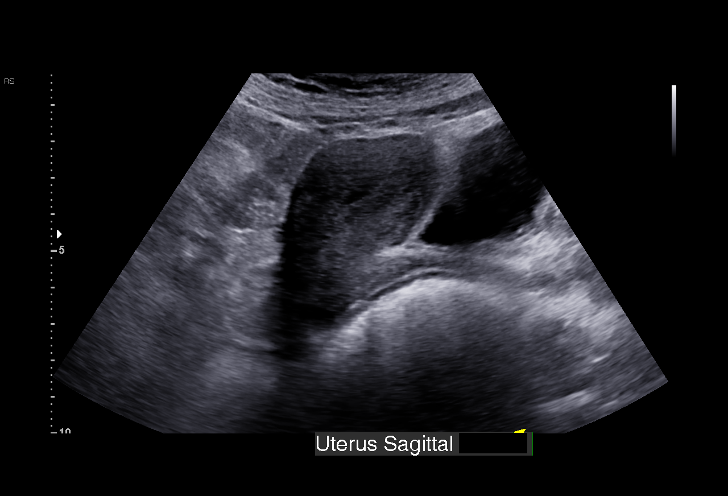
[im 6/66]
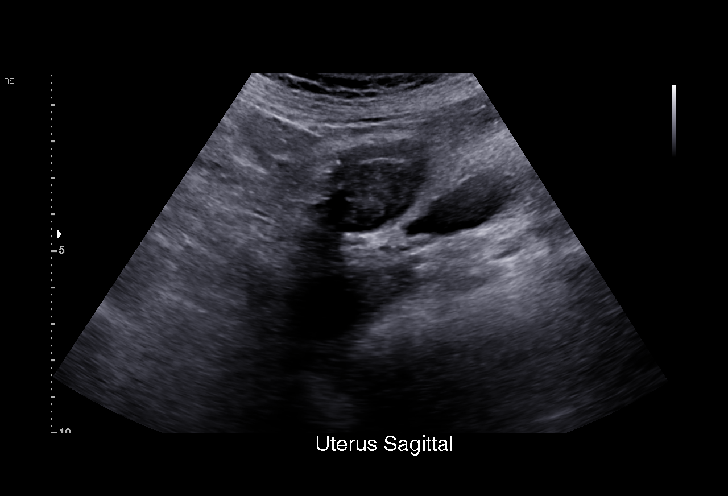
[im 11/66]
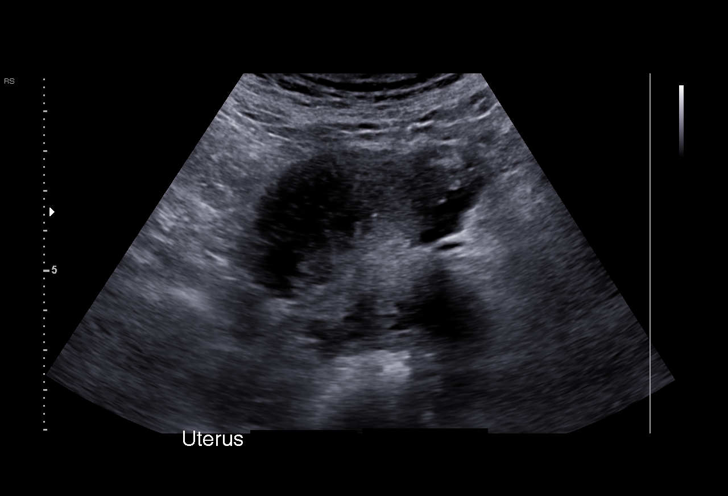
[im 14/66]
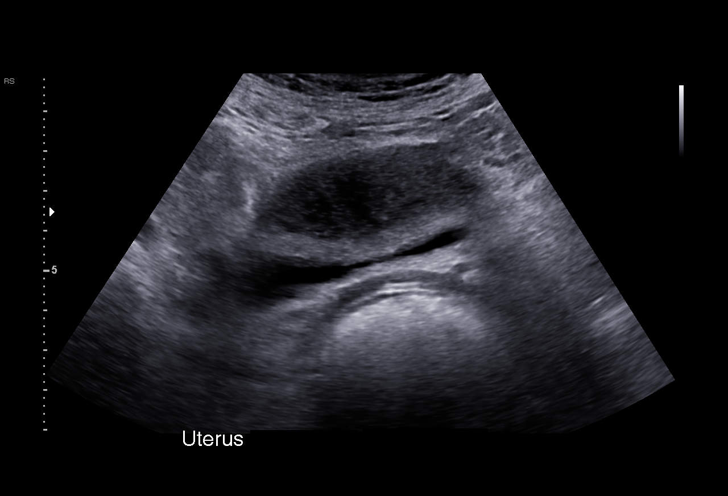
[im 19/66]
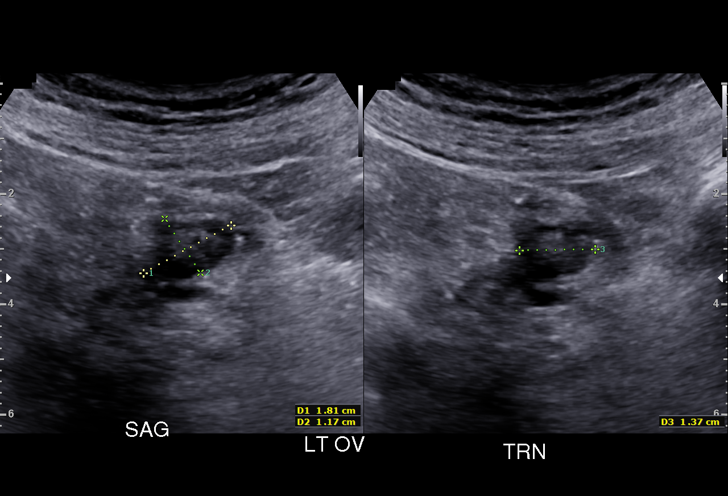
[im 25/66]
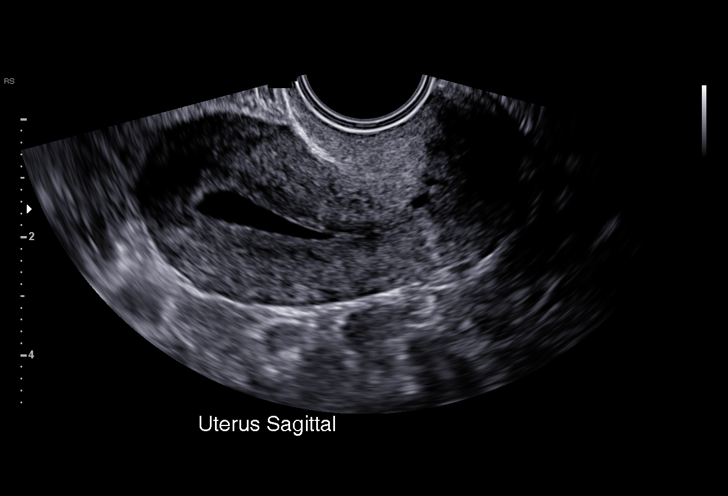
[im 28/66]
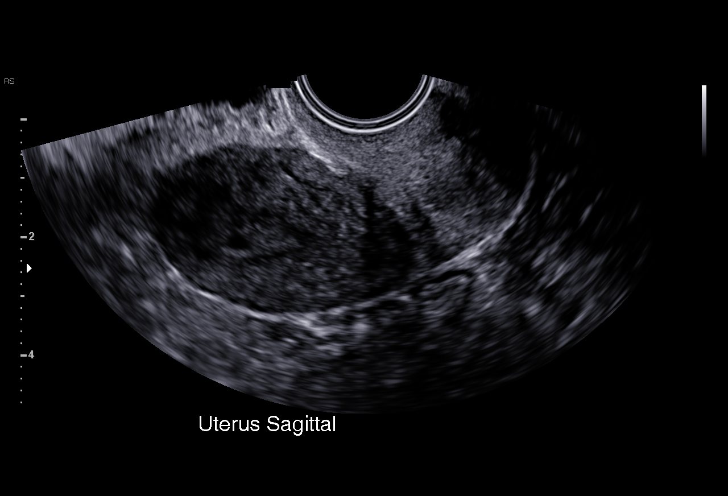
[im 33/66]
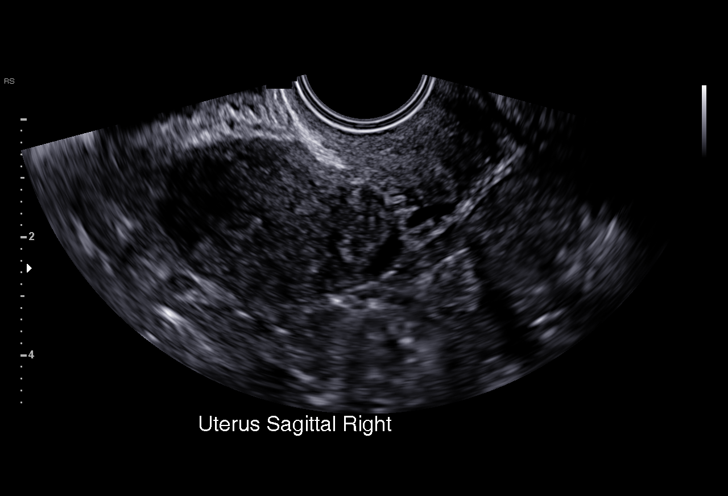
[im 38/66]
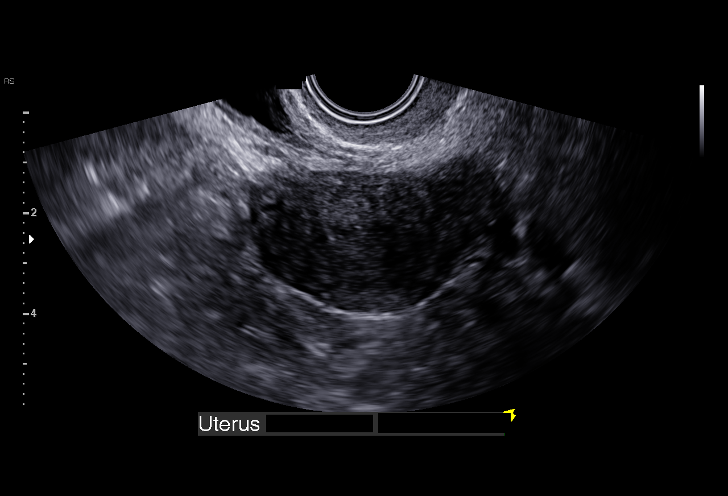
[im 41/66]
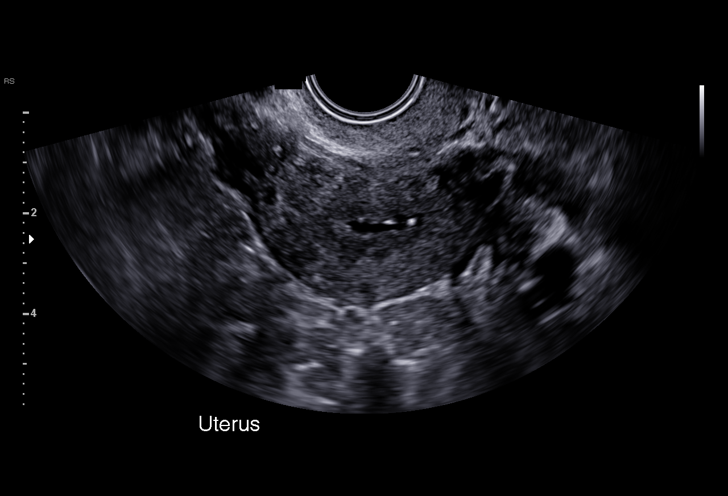
[im 47/66]
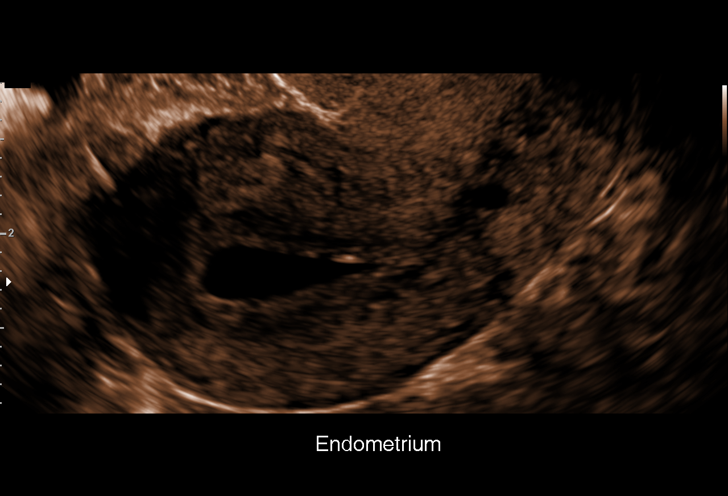
[im 52/66]
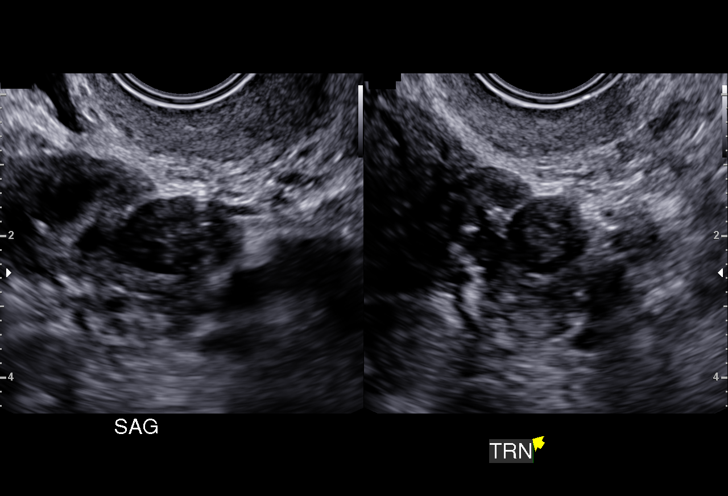
[im 55/66]
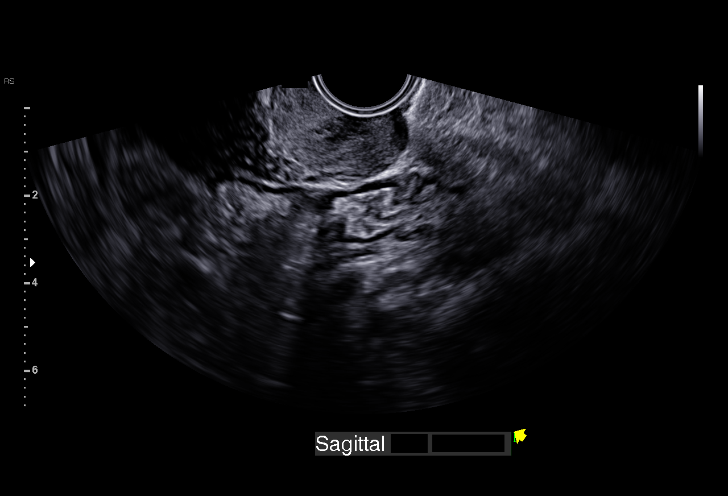
[im 60/66]
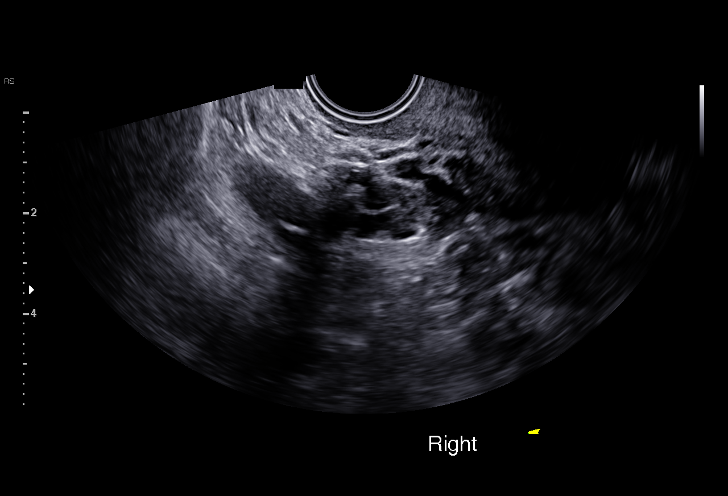
[im 66/66]
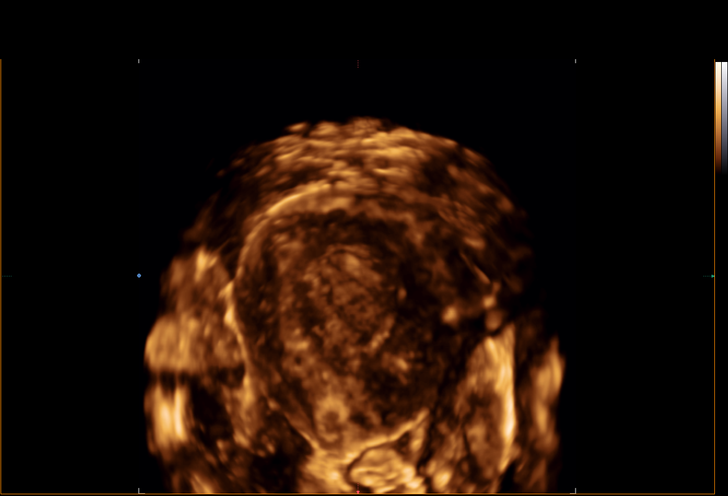

[15 of 25 positions shown; findings below may reference images not displayed]

FINDINGS: Uterus

Measurements: 7.3 x 3.4 x 5.1 cm. Mildly heterogeneous without
discrete mass.

Endometrium

Thickness: 2.9 mm.  Small amount of endometrial fluid.

Right ovary

Measurements: 1.7 x 1.0 x 1.4 cm. Normal appearance/no adnexal mass.

Left ovary

Measurements: 2.0 x 1.1 x 1.1 cm. Normal appearance/no adnexal mass.

Other findings

Trace amount of free pelvic fluid.
IMPRESSION: 1. Normal appearance of the uterus and endometrium. In the setting
of post-menopausal bleeding, this is consistent with a benign
etiology such as endometrial atrophy. If bleeding remains
unresponsive to hormonal or medical therapy, sonohysterogram should
be considered for focal lesion work-up. (Ref: Radiological
Reasoning: Algorithmic Workup of Abnormal Vaginal Bleeding with
Endovaginal Sonography and Sonohysterography. AJR [id number]; 191:S68-73)
2. Normal appearance of both ovaries.  No adnexal mass.

## 2018-11-10 ENCOUNTER — Other Ambulatory Visit: Payer: Self-pay

## 2018-11-10 ENCOUNTER — Ambulatory Visit
Admission: RE | Admit: 2018-11-10 | Discharge: 2018-11-10 | Disposition: A | Discharge status: Home / Self Care | Payer: Medicare Other | Source: Ambulatory Visit | Attending: Physician Assistant | Admitting: Physician Assistant

## 2018-11-10 ENCOUNTER — Ambulatory Visit: Payer: Medicare Other

## 2018-11-10 DIAGNOSIS — N644 Mastodynia: Secondary | ICD-10-CM

## 2018-11-10 DIAGNOSIS — R922 Inconclusive mammogram: Secondary | ICD-10-CM | POA: Diagnosis not present

## 2018-11-11 ENCOUNTER — Other Ambulatory Visit: Payer: Medicare Other

## 2018-11-17 ENCOUNTER — Telehealth: Payer: Self-pay | Admitting: Gastroenterology

## 2018-11-17 DIAGNOSIS — F419 Anxiety disorder, unspecified: Secondary | ICD-10-CM | POA: Diagnosis not present

## 2018-11-17 DIAGNOSIS — I2510 Atherosclerotic heart disease of native coronary artery without angina pectoris: Secondary | ICD-10-CM | POA: Diagnosis not present

## 2018-11-17 DIAGNOSIS — R7303 Prediabetes: Secondary | ICD-10-CM | POA: Diagnosis not present

## 2018-11-17 DIAGNOSIS — E785 Hyperlipidemia, unspecified: Secondary | ICD-10-CM | POA: Diagnosis not present

## 2018-11-17 DIAGNOSIS — J449 Chronic obstructive pulmonary disease, unspecified: Secondary | ICD-10-CM | POA: Diagnosis not present

## 2018-11-17 DIAGNOSIS — C3491 Malignant neoplasm of unspecified part of right bronchus or lung: Secondary | ICD-10-CM | POA: Diagnosis not present

## 2018-11-17 DIAGNOSIS — K219 Gastro-esophageal reflux disease without esophagitis: Secondary | ICD-10-CM | POA: Diagnosis not present

## 2018-11-17 DIAGNOSIS — K921 Melena: Secondary | ICD-10-CM | POA: Diagnosis not present

## 2018-11-17 NOTE — Telephone Encounter (Signed)
Patient called said that she had a EGD and was put on fluconazole (DIFLUCAN) 100 MG tablet. She said that she has started having bld in stool

## 2018-11-17 NOTE — Telephone Encounter (Signed)
The pt complains of occasional BRBPR.  This has not changed since last speaking with Dr Ardis Hughs on 5/13.  Per Dr Ardis Hughs last office note we discussed that she really needs to start on fiber and miralax that Dr Ardis Hughs recommended.  She has not been compliant in following that recommendation.  She was again advised to take citrucel and miralax and if after being on that regimen to call back if her symptoms do not resolve.

## 2018-11-21 ENCOUNTER — Telehealth: Payer: Self-pay | Admitting: Cardiology

## 2018-11-21 ENCOUNTER — Other Ambulatory Visit: Payer: Self-pay | Admitting: Cardiology

## 2018-11-21 DIAGNOSIS — I208 Other forms of angina pectoris: Secondary | ICD-10-CM

## 2018-11-21 MED ORDER — NITROGLYCERIN 0.4 MG SL SUBL: 0.4000 mg | SUBLINGUAL_TABLET | SUBLINGUAL | 3 refills | Status: DC | PRN

## 2018-11-21 NOTE — Telephone Encounter (Signed)
  Outpatient call on 6/20 w/ multiple complaints. Pt followed by Dr. [name]. Known CAD and prior MI w/ h/o PCI. Has concerns regarding BP, HR and recent left arm pain. She reports high BP, but max BP over the last week was 517 systolic (baseline ~616W). BP today in the 737T-062I systolic per pt report. Diastolic pressures have been running in the 60s. Also concerned about fluctuating HR, in the 70-90s. Reports, "my O2 sats are dropping". When asked level, she reported 96%. No dyspnea. She has had some recent intermittent left arm pain, "similar to my heart attack", but was pain free at time of call. Does not have SL NTG at home.   Based on current vital signs, absence of current pain, I reassured pt that there is no immediate need to seek emergency medical care at this time. I sent Rx for SL NTG to pharmacy and discussed proper use of SL NTG. She was advised that if she were to develop severe angina, similar to her prior MI, to seek emergency medical attention. Otherwise, I will route to Dr. [name] as well as triage, to f/u with pt Monday morning. Can offer an office visit with APP at [place] if still having issues. Pt was grateful for call.   Lyda Jester, PA-C

## 2018-11-21 NOTE — Telephone Encounter (Signed)
Agree with plan.   Very anxious patient  [facility]

## 2018-12-02 ENCOUNTER — Telehealth: Payer: Self-pay | Admitting: Cardiology

## 2018-12-02 NOTE — Telephone Encounter (Signed)
APPOINTMENT OBTAINED

## 2018-12-02 NOTE — Telephone Encounter (Signed)
LVM, reminding pt of her appt on 12-03-18 with Dr [name].

## 2018-12-03 ENCOUNTER — Encounter: Payer: Self-pay | Admitting: Cardiology

## 2018-12-03 ENCOUNTER — Other Ambulatory Visit: Payer: Self-pay

## 2018-12-03 ENCOUNTER — Ambulatory Visit (INDEPENDENT_AMBULATORY_CARE_PROVIDER_SITE_OTHER): Payer: Medicare Other | Admitting: Cardiology

## 2018-12-03 VITALS — BP 108/66 | HR 81 | Temp 97.2°F | Ht 65.0 in | Wt 121.0 lb

## 2018-12-03 DIAGNOSIS — I2510 Atherosclerotic heart disease of native coronary artery without angina pectoris: Secondary | ICD-10-CM | POA: Diagnosis not present

## 2018-12-03 DIAGNOSIS — R002 Palpitations: Secondary | ICD-10-CM | POA: Diagnosis not present

## 2018-12-03 DIAGNOSIS — E785 Hyperlipidemia, unspecified: Secondary | ICD-10-CM | POA: Diagnosis not present

## 2018-12-03 DIAGNOSIS — I951 Orthostatic hypotension: Secondary | ICD-10-CM | POA: Diagnosis not present

## 2018-12-03 DIAGNOSIS — I10 Essential (primary) hypertension: Secondary | ICD-10-CM | POA: Diagnosis not present

## 2018-12-03 DIAGNOSIS — I201 Angina pectoris with documented spasm: Secondary | ICD-10-CM | POA: Diagnosis not present

## 2018-12-03 DIAGNOSIS — Z9861 Coronary angioplasty status: Secondary | ICD-10-CM

## 2018-12-03 DIAGNOSIS — I208 Other forms of angina pectoris: Secondary | ICD-10-CM

## 2018-12-03 MED ORDER — CLOPIDOGREL BISULFATE 75 MG PO TABS: 75.0000 mg | ORAL_TABLET | ORAL | 1 refills | Status: DC

## 2018-12-03 NOTE — Patient Instructions (Addendum)
Medication Instructions:  Plavix  ( clopidogrel) 75 mg change to a taking every other day -start taking on Saturday ,July 4,2020   Start taking Vasecpa twice a day   If you need a refill on your cardiac medications before your next appointment, please call your pharmacy.   Lab work:  NOT NEEDED  Testing/Procedures: NOT NEEDED  Follow-Up: At [facility], you and your health needs are our priority.  As part of our continuing mission to provide you with exceptional heart care, we have created designated Provider Care Teams.  These Care Teams include your primary Cardiologist (physician) and Advanced Practice Providers (APPs -  Physician Assistants and Nurse Practitioners) who all work together to provide you with the care you need, when you need it. . You will need a follow up appointment in  12 months July 2021.  Please call our office 2 months in advance to schedule this appointment.  You may see Glenetta Hew, MD or one of the following Advanced Practice Providers on your designated Care Team:   . Rosaria Ferries, PA-C . Jory Sims, DNP, ANP  Any Other Special Instructions Will Be Listed Below.  Need increase eating - eat more protein  Liberalize salt intake  ( add salt to food ) And increase exercise  Stretching and balance   FOR PALPITATION AT NIGHT - TRY TO DO VAGAL MANEUVERS Recommendations for vagal maneuvers:  "Bearing down"  Coughing  Gagging  Icy, cold towel on face or drink ice cold water   ESPECIALLY DRINK A GLASS OF ICE COLD WATER
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
## 2018-12-08 ENCOUNTER — Encounter: Payer: Self-pay | Admitting: Cardiology

## 2018-12-08 NOTE — Assessment & Plan Note (Signed)
Increase Hydration Liberalize salt & food intake Use cane for balance

## 2018-12-08 NOTE — Assessment & Plan Note (Signed)
Unable to tolerate Beta Blocker - even low dose PRN lopressor.  Discussed vagal maneuvers, hydration & liberalization of salt intake

## 2018-12-08 NOTE — Assessment & Plan Note (Signed)
Only able to tolerate low dose Pravastatin Increase Vascepa to BID

## 2018-12-08 NOTE — Assessment & Plan Note (Signed)
On Imdur - unable to tolerate Amlodpine

## 2018-12-08 NOTE — Assessment & Plan Note (Signed)
Now more an issue of hypotension -- not on any medications

## 2018-12-08 NOTE — Assessment & Plan Note (Signed)
Extensive CAD now with 2- Myoview was most recently in 2017 to evaluate for his chest pains.  She still has his symptoms are most likely musculoskeletal in nature. Essentially her distal movement, but does not seem to be showing signs of ischemia.  Unable to tolerate any of the standard cardiac agents including beta-blocker or ARB.  Is only able to tolerate low-dose Imdur. Tolerating pravastatin the current dose, but has not tolerated increased dose. Is currently on Plavix for secondary prevention, we will change this to every other day to help with some of the bruising.

## 2018-12-09 DIAGNOSIS — R7303 Prediabetes: Secondary | ICD-10-CM | POA: Diagnosis not present

## 2018-12-09 DIAGNOSIS — R531 Weakness: Secondary | ICD-10-CM | POA: Diagnosis not present

## 2018-12-09 DIAGNOSIS — R350 Frequency of micturition: Secondary | ICD-10-CM | POA: Diagnosis not present

## 2018-12-09 DIAGNOSIS — R109 Unspecified abdominal pain: Secondary | ICD-10-CM | POA: Diagnosis not present

## 2018-12-09 DIAGNOSIS — E7800 Pure hypercholesterolemia, unspecified: Secondary | ICD-10-CM | POA: Diagnosis not present

## 2018-12-14 ENCOUNTER — Other Ambulatory Visit (HOSPITAL_COMMUNITY)
Admission: RE | Admit: 2018-12-14 | Discharge: 2018-12-14 | Disposition: A | Discharge status: Home / Self Care | Payer: Medicare Other | Source: Ambulatory Visit | Attending: Obstetrics and Gynecology | Admitting: Obstetrics and Gynecology

## 2018-12-14 ENCOUNTER — Other Ambulatory Visit: Payer: Self-pay | Admitting: Cardiology

## 2018-12-14 ENCOUNTER — Other Ambulatory Visit: Payer: Self-pay | Admitting: Obstetrics and Gynecology

## 2018-12-14 DIAGNOSIS — N390 Urinary tract infection, site not specified: Secondary | ICD-10-CM | POA: Diagnosis not present

## 2018-12-14 DIAGNOSIS — Z1151 Encounter for screening for human papillomavirus (HPV): Secondary | ICD-10-CM | POA: Diagnosis not present

## 2018-12-14 DIAGNOSIS — Z01419 Encounter for gynecological examination (general) (routine) without abnormal findings: Secondary | ICD-10-CM | POA: Diagnosis not present

## 2018-12-14 DIAGNOSIS — R102 Pelvic and perineal pain: Secondary | ICD-10-CM | POA: Diagnosis not present

## 2018-12-14 DIAGNOSIS — N3941 Urge incontinence: Secondary | ICD-10-CM | POA: Diagnosis not present

## 2018-12-15 DIAGNOSIS — R102 Pelvic and perineal pain: Secondary | ICD-10-CM | POA: Diagnosis not present

## 2018-12-16 LAB — CYTOLOGY - PAP
Diagnosis: NEGATIVE
HPV: NOT DETECTED

## 2018-12-21 DIAGNOSIS — H47321 Drusen of optic disc, right eye: Secondary | ICD-10-CM | POA: Diagnosis not present

## 2018-12-21 DIAGNOSIS — H16223 Keratoconjunctivitis sicca, not specified as Sjogren's, bilateral: Secondary | ICD-10-CM | POA: Diagnosis not present

## 2018-12-21 DIAGNOSIS — H2513 Age-related nuclear cataract, bilateral: Secondary | ICD-10-CM | POA: Diagnosis not present

## 2018-12-21 DIAGNOSIS — H04123 Dry eye syndrome of bilateral lacrimal glands: Secondary | ICD-10-CM | POA: Diagnosis not present

## 2018-12-21 DIAGNOSIS — H25013 Cortical age-related cataract, bilateral: Secondary | ICD-10-CM | POA: Diagnosis not present

## 2018-12-28 DIAGNOSIS — I2510 Atherosclerotic heart disease of native coronary artery without angina pectoris: Secondary | ICD-10-CM | POA: Diagnosis not present

## 2018-12-28 DIAGNOSIS — C3491 Malignant neoplasm of unspecified part of right bronchus or lung: Secondary | ICD-10-CM | POA: Diagnosis not present

## 2018-12-28 DIAGNOSIS — E785 Hyperlipidemia, unspecified: Secondary | ICD-10-CM | POA: Diagnosis not present

## 2018-12-28 DIAGNOSIS — J439 Emphysema, unspecified: Secondary | ICD-10-CM | POA: Diagnosis not present

## 2018-12-28 DIAGNOSIS — J441 Chronic obstructive pulmonary disease with (acute) exacerbation: Secondary | ICD-10-CM | POA: Diagnosis not present

## 2018-12-29 ENCOUNTER — Telehealth: Payer: Self-pay | Admitting: Cardiology

## 2018-12-29 NOTE — Telephone Encounter (Signed)
New message   Patient has questions about echo results and patient also has a question about clopidogrel (PLAVIX) 75 MG tablet. Please call.

## 2018-12-29 NOTE — Telephone Encounter (Signed)
I have responded twice on my chart with her about this issue.  The EKG is fine there is nothing to worry about, and I want her to stop worrying about it.    I have answered this question twice.  Glenetta Hew, MD

## 2018-12-29 NOTE — Telephone Encounter (Signed)
Returned call to patient she stated she was reading results of EKG done 12/03/18 on mychart and she wanted to know why results say non specific changes.Stated she is worried that no one ever told her.Patient reassured EKG was normal.She wanted to ask Dr.Harding to make sure.She also wanted to ask Dr.Harding why he decreased Plavix to every other day.Stated since she decreased her heart has been beating fast.Advised decreasing Plavix will not cause heart to beat fast.Advised I will send message to [place].

## 2018-12-31 ENCOUNTER — Ambulatory Visit
Admission: RE | Admit: 2018-12-31 | Discharge: 2018-12-31 | Disposition: A | Discharge status: Home / Self Care | Payer: Medicare Other | Source: Ambulatory Visit | Attending: Family Medicine | Admitting: Family Medicine

## 2018-12-31 ENCOUNTER — Other Ambulatory Visit: Payer: Self-pay | Admitting: Family Medicine

## 2018-12-31 ENCOUNTER — Other Ambulatory Visit: Payer: Self-pay

## 2018-12-31 DIAGNOSIS — R7303 Prediabetes: Secondary | ICD-10-CM | POA: Diagnosis not present

## 2018-12-31 DIAGNOSIS — R1032 Left lower quadrant pain: Secondary | ICD-10-CM | POA: Diagnosis not present

## 2018-12-31 DIAGNOSIS — M25552 Pain in left hip: Secondary | ICD-10-CM

## 2018-12-31 DIAGNOSIS — R1084 Generalized abdominal pain: Secondary | ICD-10-CM | POA: Diagnosis not present

## 2018-12-31 DIAGNOSIS — J439 Emphysema, unspecified: Secondary | ICD-10-CM | POA: Diagnosis not present

## 2018-12-31 DIAGNOSIS — I2510 Atherosclerotic heart disease of native coronary artery without angina pectoris: Secondary | ICD-10-CM | POA: Diagnosis not present

## 2018-12-31 DIAGNOSIS — S79912A Unspecified injury of left hip, initial encounter: Secondary | ICD-10-CM | POA: Diagnosis not present

## 2019-01-01 NOTE — Telephone Encounter (Signed)
Spoke to patient Dr.Harding's advice given.Advised her to stop worrying about the EKG.She stated she continues to have frequent palpitations.Advised I will send message to [place] for advice.

## 2019-01-04 ENCOUNTER — Other Ambulatory Visit: Payer: Self-pay | Admitting: Family Medicine

## 2019-01-04 NOTE — Telephone Encounter (Signed)
She always has palpitations -- we have been through this many times.  The EKG has nothing to do with palpitations & we unfortunately really do not have any options to treat palpitations b/c low BP.  When we have evaluated these palpitations - they are benign.    Driven by anxiety & stress.  Glenetta Hew, MD

## 2019-01-06 ENCOUNTER — Other Ambulatory Visit: Payer: Self-pay | Admitting: Family Medicine

## 2019-01-06 DIAGNOSIS — R1031 Right lower quadrant pain: Secondary | ICD-10-CM

## 2019-01-06 NOTE — Telephone Encounter (Signed)
Returned call to patient no answer.Left Dr.Harding's advice on personal voice mail.

## 2019-01-12 ENCOUNTER — Other Ambulatory Visit: Payer: Medicare Other

## 2019-01-13 ENCOUNTER — Ambulatory Visit
Admission: RE | Admit: 2019-01-13 | Discharge: 2019-01-13 | Disposition: A | Discharge status: Home / Self Care | Payer: Medicare Other | Source: Ambulatory Visit | Attending: Family Medicine | Admitting: Family Medicine

## 2019-01-13 ENCOUNTER — Other Ambulatory Visit: Payer: Self-pay

## 2019-01-13 DIAGNOSIS — N281 Cyst of kidney, acquired: Secondary | ICD-10-CM | POA: Diagnosis not present

## 2019-01-13 DIAGNOSIS — R1031 Right lower quadrant pain: Secondary | ICD-10-CM

## 2019-01-13 MED ORDER — IOPAMIDOL (ISOVUE-300) INJECTION 61%
100.0000 mL | Freq: Once | INTRAVENOUS | Status: AC | PRN
  Administered 2019-01-13: 100 mL via INTRAVENOUS

## 2019-01-18 IMAGING — MR MR HEAD WO/W CM
11 series · 48 of 48 positions shown · IV contrast (Multihance 10CC)
Comparison: MRI brain 06/12/2016.

CLINICAL DATA: Small cell lung cancer. Gait disturbance. Hearing
loss. Weakness and depression.

EXAM:
MRI HEAD WITHOUT AND WITH CONTRAST
TECHNIQUE: Multiplanar, multiecho pulse sequences of the brain and surrounding
structures were obtained without and with intravenous contrast.
CONTRAST:  10mL MULTIHANCE GADOBENATE DIMEGLUMINE 529 MG/ML IV SOLN

[Series 2: FLAIR · sagittal · 3.0mm · 0.75mm/px · 2 of 39 slices shown (1 of 2)]
[im 1/39]
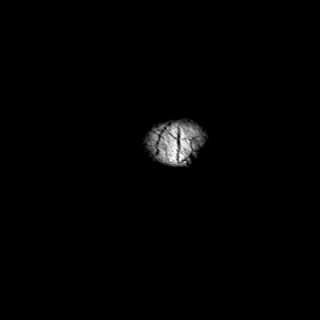
[im 39/39]
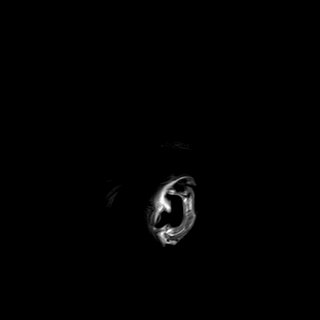

[Series 3: DWI · axial · 3.0mm · 1.50mm/px · z∈[-76,+73]mm · 4 of 78 slices shown (1 of 2)]
[im 1/78]
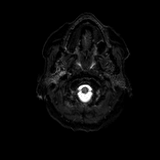
[im 26/78]
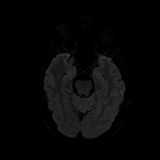
[im 52/78]
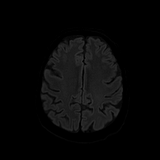
[im 78/78]
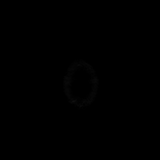

[Series 4: DWI · axial · 3.0mm · 1.50mm/px · z∈[-76,+73]mm · 3 of 38 slices shown (2 of 2)]
[im 1/38]
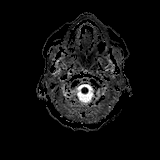
[im 19/38]
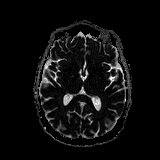
[im 38/38]
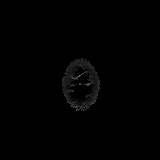

[Series 5: T2 · axial · 5.0mm · 0.57mm/px · z∈[-82,+73]mm · 2 of 27 slices shown]
[im 1/27]
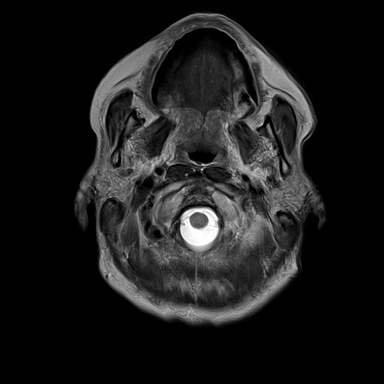
[im 27/27]
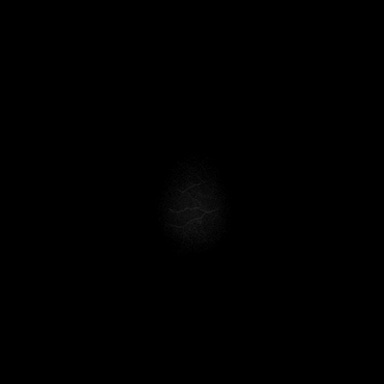

[Series 6: GRE · axial · 5.0mm · 0.57mm/px · z∈[-82,+73]mm · 2 of 27 slices shown]
[im 1/27]
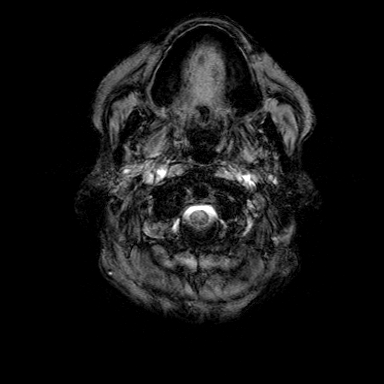
[im 27/27]
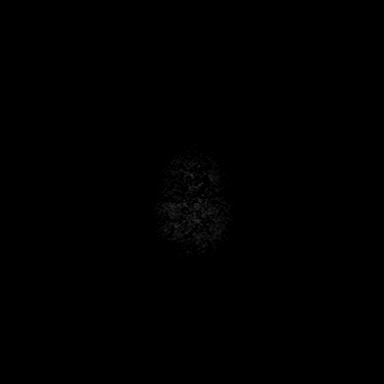

[Series 7: FLAIR · axial · 3.0mm · 0.57mm/px · z∈[-83,+79]mm · 4 of 55 slices shown (2 of 2)]
[im 1/55]
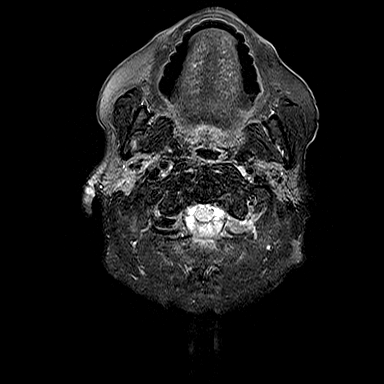
[im 19/55]
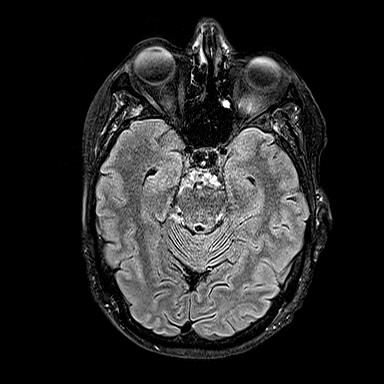
[im 37/55]
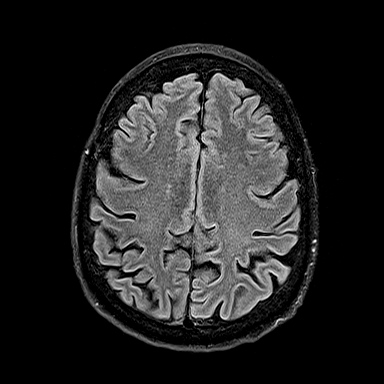
[im 55/55]
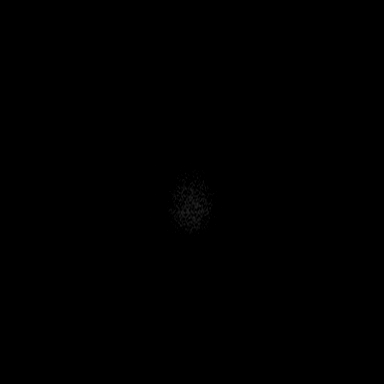

[Series 8: T1 · axial · 1.0mm · 0.75mm/px · z∈[-82,+77]mm · 11 of 160 slices shown]
[im 1/160]
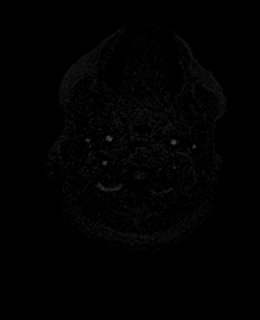
[im 16/160]
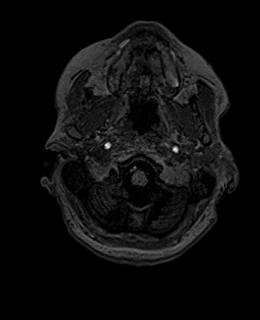
[im 32/160]
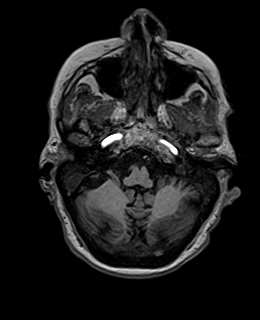
[im 48/160]
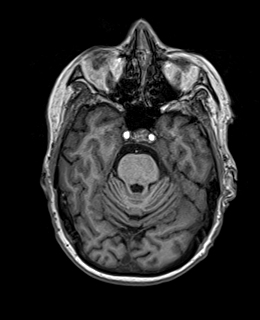
[im 64/160]
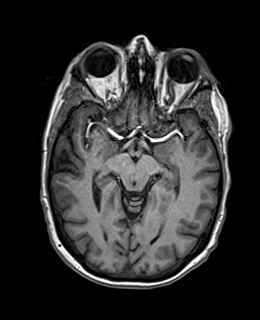
[im 80/160]
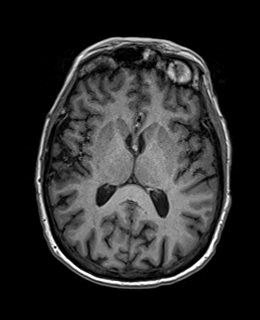
[im 96/160]
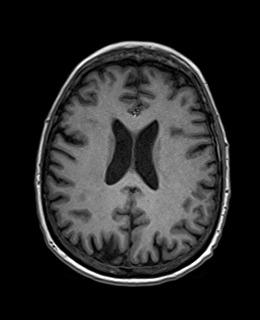
[im 112/160]
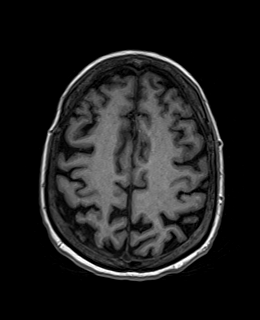
[im 128/160]
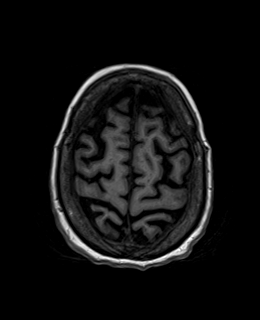
[im 144/160]
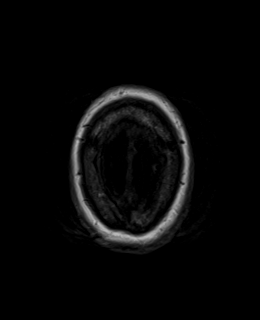
[im 160/160]
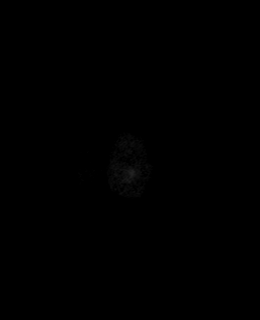

[Series 9: T2 post-contrast · coronal · 3.0mm · 0.57mm/px · 3 of 47 slices shown]
[im 1/47]
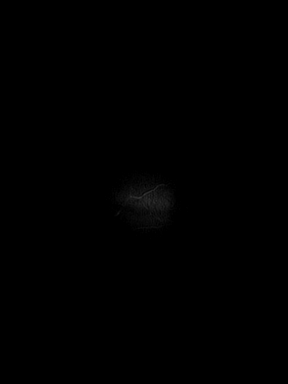
[im 24/47]
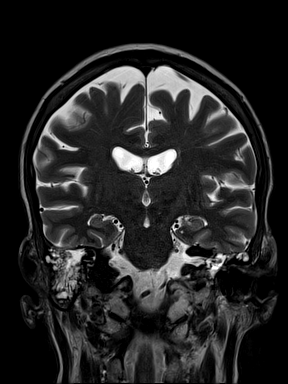
[im 47/47]
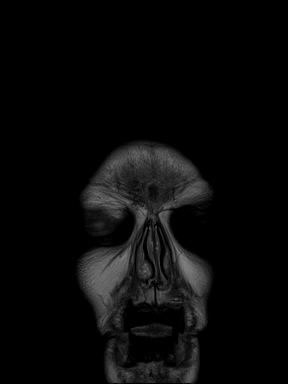

[Series 10: T1 post-contrast · axial · 1.0mm · 0.75mm/px · z∈[-82,+77]mm · 11 of 160 slices shown (1 of 2)]
[im 1/160]
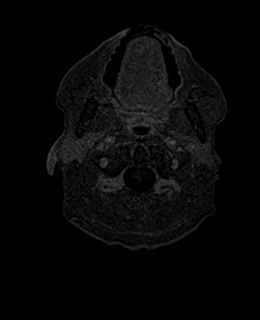
[im 16/160]
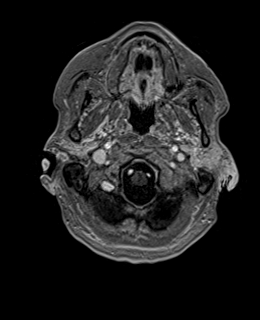
[im 32/160]
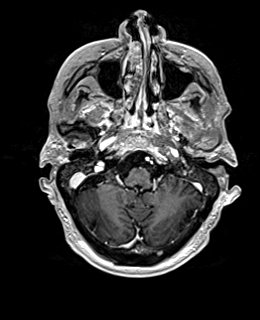
[im 48/160]
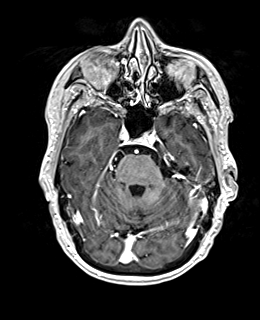
[im 64/160]
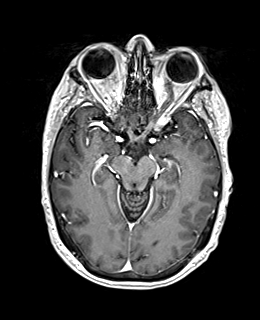
[im 80/160]
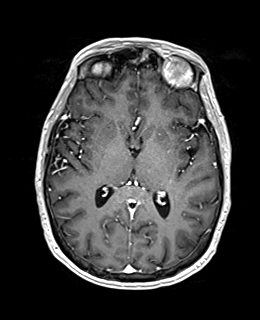
[im 96/160]
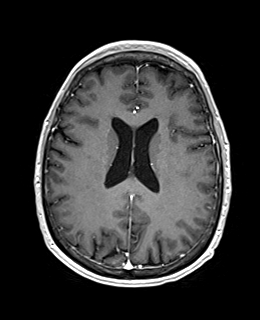
[im 112/160]
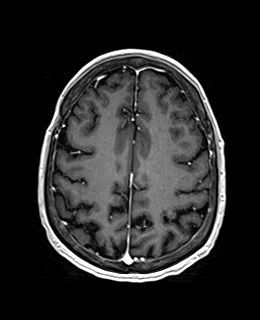
[im 128/160]
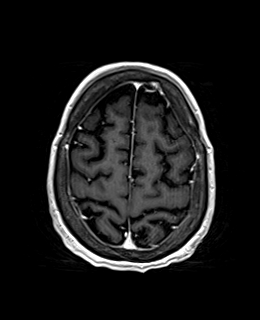
[im 144/160]
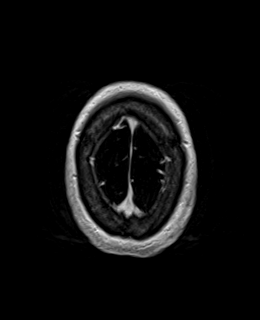
[im 160/160]
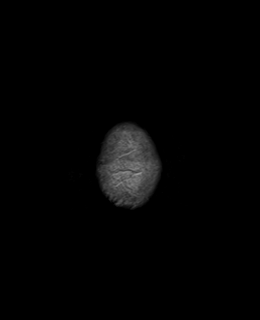

[Series 11: T1 post-contrast · coronal · 3.0mm · 0.57mm/px · 3 of 47 slices shown (2 of 2)]
[im 1/47]
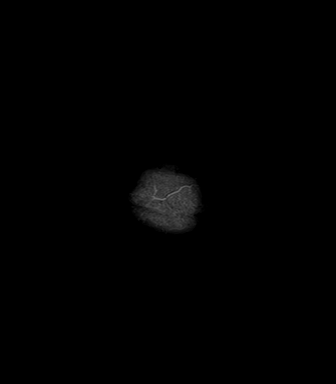
[im 24/47]
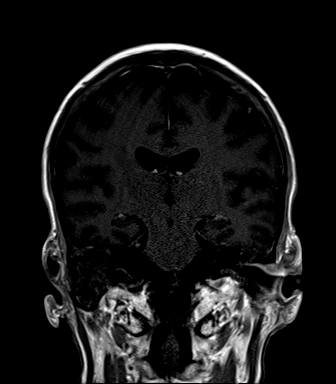
[im 47/47]
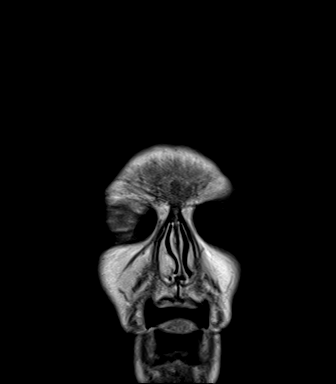

[Series 12: FLAIR post-contrast · sagittal · 3.0mm · 0.75mm/px · 3 of 39 slices shown]
[im 1/39]
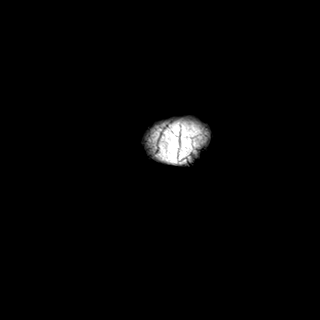
[im 20/39]
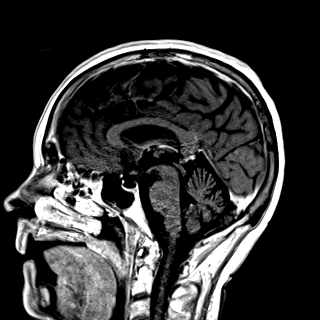
[im 39/39]
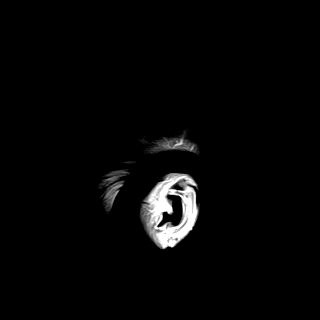

[48 of 48 positions shown; findings below may reference images not displayed]

FINDINGS: Brain: No focal lesions are present. There is no pathologic
enhancement to suggest metastatic disease of the brain or meninges.
A partially empty sella is again noted. No acute hemorrhage or mass
lesion is present. The ventricles are of normal size. No significant
extra-axial fluid collection is present.

Vascular: Flow is present in the major intracranial arteries.

Skull and upper cervical spine: The skullbase is within normal
limits. Midline sagittal structures are otherwise unremarkable.
Craniocervical junction is normal.

Sinuses/Orbits: The paranasal sinuses are clear. Bilateral mastoid
effusions are present. No obstructing nasopharyngeal lesion is
present. The globes and orbits are within normal limits.
IMPRESSION: 1. No evidence for metastatic disease to the brain.
2. No acute intracranial abnormality or significant interval change.
3. Bilateral mastoid effusions are new. No significant
nasopharyngeal lesion is evident.

## 2019-01-20 ENCOUNTER — Encounter: Payer: Self-pay | Admitting: Physician Assistant

## 2019-01-20 ENCOUNTER — Telehealth: Payer: Self-pay

## 2019-01-20 ENCOUNTER — Other Ambulatory Visit: Payer: Self-pay

## 2019-01-20 ENCOUNTER — Ambulatory Visit (INDEPENDENT_AMBULATORY_CARE_PROVIDER_SITE_OTHER): Payer: Medicare Other | Admitting: Physician Assistant

## 2019-01-20 VITALS — BP 120/60 | HR 64 | Temp 98.3°F | Ht 64.5 in | Wt 123.1 lb

## 2019-01-20 DIAGNOSIS — I208 Other forms of angina pectoris: Secondary | ICD-10-CM

## 2019-01-20 DIAGNOSIS — R1031 Right lower quadrant pain: Secondary | ICD-10-CM | POA: Diagnosis not present

## 2019-01-20 DIAGNOSIS — Z86010 Personal history of colonic polyps: Secondary | ICD-10-CM | POA: Diagnosis not present

## 2019-01-20 DIAGNOSIS — Z1211 Encounter for screening for malignant neoplasm of colon: Secondary | ICD-10-CM

## 2019-01-20 DIAGNOSIS — K5900 Constipation, unspecified: Secondary | ICD-10-CM | POA: Diagnosis not present

## 2019-01-20 MED ORDER — GOLYTELY 236 G PO SOLR: 4000.0000 mL | Freq: Once | ORAL | 0 refills | Status: AC

## 2019-01-20 NOTE — Progress Notes (Signed)
Subjective:    Patient ID: Michele [name], female    DOB: 1956/04/15, 63 y.o.   MRN: [id number]  HPI Michele [name] is a 63 year old white female, known to Dr. Ardis Hughs who comes in today with complaints of right lower quadrant pain which is progressed over the past 3 to 4 months. Patient was last seen in May 2020 when she had EGD that showed candidiasis of the esophagus and a medium hiatal hernia. Colonoscopy was done in May 2017 with removal of 2 sessile polyps 3 to 4 mm in size these were sessile serrated adenomas, and otherwise negative exam. Patient has history of abdominal aortic aneurysm, hypertension, coronary artery disease status post PTCA and MI she is maintained on Plavix.  Also with COPD, bronchiectasis, and history of small cell lung cancer diagnosed September 2017, she completed chemotherapy and concurrent radiation and also had whole brain radiation.  She has been on observation therapy since.  She had follow-up CT of the chest April 2020 with no concerning findings for disease recurrence or progression.  Patient says she developed lower abdominal and primarily right lower quadrant pain about 4 months ago.  She says this is been constant present daily and at times sharp or catching in nature.  She says sometimes it hurts to turn over or bend over .  She also feels that abdominal pain is worse postprandially more generalized.  She has intermittent problems with constipation, does not take her MiraLAX regularly.  She is occasionally seen scant amounts of bright red blood.  She is not having any problems with nausea or vomiting.  Appetite is been okay and weight has generally been stable. She has had recent GYN evaluation for the right lower quadrant pain and was told no GYN etiology.  She is also been seen by her PCP Dr. [name] and then referred here. She had CT of the abdomen and pelvis done on 01/13/2019, chronic changes and atherosclerosis but no acute abnormality, appendix not well  visualized. No recent labs. She also has had a sore place on her buttock that she would like looked at.  Her husband says that this looked like a boil which opened and drained some purulent material recently.  Patient feels this area is somewhat pruritic  Review of Systems Pertinent positive and negative review of systems were noted in the above HPI section.  All other review of systems was otherwise negative.  Outpatient Encounter Medications as of 01/20/2019  Medication Sig   ALPRAZolam (XANAX) 1 MG tablet Take 1 mg by mouth 3 (three) times daily.    ARNUITY ELLIPTA 100 MCG/ACT AEPB INHALE 1 PUFF INTO THE LUNGS DAILY (Patient taking differently: Inhale 1 puff into the lungs daily. )   Calcium Citrate-Vitamin D (CITRACAL + D PO) Take 1 tablet by mouth daily.   clopidogrel (PLAVIX) 75 MG tablet Take 1 tablet (75 mg total) by mouth every other day.   fluticasone (FLONASE) 50 MCG/ACT nasal spray INSTILL 1 SPRAY INTO BOTH NOSTRILS DAILY (Patient taking differently: Place 1 spray into both nostrils daily as needed for allergies. )   isosorbide mononitrate (IMDUR) 30 MG 24 hr tablet TAKE 1 TABLET(30 MG) BY MOUTH AT BEDTIME   levalbuterol (XOPENEX HFA) 45 MCG/ACT inhaler Inhale 2 puffs into the lungs every 6 (six) hours as needed for wheezing.   Multiple Vitamin (MULTIVITAMIN WITH MINERALS) TABS tablet Take 1 tablet by mouth daily. Multivitamin for Women 50+   pantoprazole (PROTONIX) 40 MG tablet Take 40 mg by mouth  daily.   polyethylene glycol (MIRALAX / GLYCOLAX) 17 g packet Take 17 g by mouth 2 (two) times daily.   pravastatin (PRAVACHOL) 40 MG tablet Take 1 tablet (40 mg total) by mouth every evening.   Respiratory Therapy Supplies (FLUTTER) DEVI 1 application by Does not apply route 2 (two) times daily.   tiotropium (SPIRIVA) 18 MCG inhalation capsule Place 18 mcg into inhaler and inhale daily.   Ubiquinone (ULTRA COQ10 PO) Take 1 capsule by mouth daily.   VASCEPA 1 g CAPS TAKE 1  CAPSULES BY MOUTH TWICE DAILY (Patient taking differently: Take 1 g by mouth daily. )   Wheat Dextrin (BENEFIBER PO) Take 1 Dose by mouth as needed.    levalbuterol (XOPENEX) 0.63 MG/3ML nebulizer solution Take 3 mLs (0.63 mg total) by nebulization every 4 (four) hours as needed for wheezing or shortness of breath (dx: R00.2, J44.9). (Patient not taking: Reported on 01/20/2019)   nitroGLYCERIN (NITROSTAT) 0.4 MG SL tablet Place 1 tablet (0.4 mg total) under the tongue every 5 (five) minutes as needed for chest pain. (Patient not taking: Reported on 01/20/2019)   polyethylene glycol (GOLYTELY) 236 g solution Take 4,000 mLs by mouth once for 1 dose.   Facility-Administered Encounter Medications as of 01/20/2019  Medication   HYDROcodone-acetaminophen (NORCO/VICODIN) 5-325 MG per tablet 1 tablet   Allergies  Allergen Reactions   Ciprofloxacin Other (See Comments)    Hypoglycemia    Aspirin Other (See Comments)    GI upset; patient is only able to take enteric coated Aspirin.     Crestor [Rosuvastatin] Other (See Comments)    Muscle pain    Ibuprofen Other (See Comments)    GI upset    Wellbutrin [Bupropion] Palpitations   Zoloft [Sertraline Hcl] Other (See Comments)    Insomnia, fatigue    Albuterol Palpitations   Lipitor [Atorvastatin] Other (See Comments)    Muscle pain    Sulfonamide Derivatives Itching and Rash   Patient Active Problem List   Diagnosis Date Noted   Candidal esophagitis ([place])    Bronchiectasis without complication ([place]) 69/67/8938   Medication management 06/12/2018   Cervical adenopathy 04/24/2018   CAD S/P percutaneous coronary angioplasty 12/21/2017   Orthostatic syncope 09/09/2017   Syncope 08/29/2017   AAA (abdominal aortic aneurysm) without rupture ([place]) 08/13/2017   Orthostatic hypotension 08/13/2017   Headache, frequent episodic tension-type 06/12/2017   Thrush 02/27/2017   Cough 02/20/2017   COPD without exacerbation ([place])  12/12/2016   Dysphagia 12/12/2016   Dizziness of unknown cause 11/20/2016   PMB (postmenopausal bleeding) 06/17/2016   Superficial venous thrombosis of right upper extremity 05/22/2016   Extravasation injury 04/18/2016   Constipation 03/29/2016   Cancer associated pain 03/29/2016   Dehydration 03/29/2016   Encounter for antineoplastic chemotherapy 03/12/2016   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   Small cell carcinoma of right lung ([place]) 02/26/2016   Stable angina ([place]) 02/25/2015   Stenosis of coronary stent 02/25/2015   Abdominal pain in female patient 11/24/2014   Chronic fatigue and malaise 04/10/2014   Heart palpitations 11/28/2013   Essential hypertension 05/30/2013   Coronary artery spasm, hx of 04/30/2013   Acrocyanosis ([place]) 01/01/2013   Tobacco abuse, stopped March / 2019    Edema of upper extremity 11/30/2012   Lipoma of lower extremity 10/23/2012   History ST elevation myocardial infarction (STEMI) of inferolateral wall 10/02/2011   Osteopenia 07/30/2011   Leg pain, anterior, left 08/24/2010   Palpitations 11/28/2009   HERPES  SIMPLEX INFECTION 06/28/2009   VITAMIN D DEFICIENCY 11/28/2008   INSOMNIA 10/01/2007   HYPERGLYCEMIA 08/30/2007   Dyslipidemia, goal LDL below 70 08/10/2007   COPD (chronic obstructive pulmonary disease) ([place]) 12/23/2006   Anxiety state 06/11/2006    Class: Diagnosis of   Depression 06/11/2006   RESTLESS LEG SYNDROME 06/11/2006   Abnormality of esophagus 06/11/2006   KNEE PAIN, CHRONIC 06/11/2006   SPONDYLOSIS, CERVICAL, WITH RADICULOPATHY 06/11/2006   LOW BACK PAIN 06/11/2006   SEIZURE DISORDER 06/11/2006    Class: Diagnosis of   Social History   Socioeconomic History   Marital status: Significant Other    Spouse name: Not on file   Number of children: 5   Years of education: Not on file   Highest education level: Not on file  Occupational History    Occupation: Disabled     Employer: [employer] resource strain: Not on file   Food insecurity    Worry: Not on file    Inability: Not on file   Transportation needs    Medical: No    Non-medical: No  Tobacco Use   Smoking status: Former Smoker    Packs/day: 1.50    Years: 40.00    Pack years: 60.00    Types: Cigarettes    Quit date: 08/2017    Years since quitting: 1.4   Smokeless tobacco: Never Used   Tobacco comment: 04/15/12 "I quit once for 2 1/2 years; smoking cessation counselor already here to visit"; done to less than 1/2 ppd (03/02/2013) - "1 pack per week" - 05/24/13 - ACTUALLY QUIT 08/2017  Substance and Sexual Activity   Alcohol use: No    Alcohol/week: 0.0 standard drinks   Drug use: No   Sexual activity: Not Currently    Birth control/protection: Post-menopausal  Lifestyle   Physical activity    Days per week: Not on file    Minutes per session: Not on file   Stress: Not on file  Relationships   Social connections    Talks on phone: Not on file    Gets together: Not on file    Attends religious service: Not on file    Active member of club or organization: Not on file    Attends meetings of clubs or organizations: Not on file    Relationship status: Not on file   Intimate partner violence    Fear of current or ex partner: Not on file    Emotionally abused: Not on file    Physically abused: Not on file    Forced sexual activity: Not on file  Other Topics Concern   Not on file  Social History Narrative   Divorced mother of 9 and a grandmother 18, great-grandmother of 1    On disability, previously worked as a Educational psychologist.  Quit smoking 06/2007 but restarted 1/11 -- smoking a pack a day.  -- now a pack lasts a week.   Does not drink alcohol.   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   0 Caffeine drinks daily     Michele [name]'s family history includes Asthma in her mother; Cancer in her  maternal grandmother; Colon polyps in her mother; Diabetes in her mother; Emphysema in her mother; Heart attack in her maternal grandfather; Heart disease in her father and mother; Hyperlipidemia in her mother; Hypertension in her mother; Kidney cancer in her brother; Stomach cancer in her brother and brother; Stroke in her mother;  Stroke (age of onset: 23) in her brother; Thyroid cancer in her daughter.      Objective:    Vitals:   01/20/19 1559  BP: 120/60  Pulse: 64  Temp: 98.3 F (36.8 C)    Physical Exam;Well-developed, chronically ill-appearing older white female in no acute distress..  Accompanied by her husband height, Weight, 123 BMI 20.8- somewhat hard of hearing  HEENT; nontraumatic normocephalic, EOMI, PE R LA, sclera anicteric. Oropharynx; not examined/wearing mass/COVID Neck; supple, no JVD Cardiovascular; regular rate and rhythm with S1-S2, no murmur rub or gallop Pulmonary; Clear bilaterally Abdomen; soft,  nondistended, no palpable mass or hepatosplenomegaly, bowel sounds are active, she is tender in the right lower quadrant, unable to palpate abdominal wall hernia, She also has mild tenderness across the lower abdomen Rectal; external exam only, small external hemorrhoids noninflamed there is no perineal rash or inflammation.  She does have 2 mildly erythematous scabbed over lesions at the gluteal cleft, 1 on the right and 1 on the left, these are nontender, there is no fluctuance, no exudate. Skin; benign exam, multiple ecchymoses of the upper and lower extremities in various stages of healing, thin skin Extremities; no clubbing cyanosis or edema skin warm and dry Neuro/Psych; alert and oriented x4, grossly nonfocal mood and affect appropriate       Assessment & Plan:   #76 63 year old white female with 3 to 2-monthhistory of somewhat progressive right lower quadrant pain, and also with intermittent complaints of more generalized postprandial pain. Etiology for the  right lower quadrant pain is unclear. CT of the abdomen and pelvis done last week unrevealing.  Question abdominal wall pain, rule out small ventral hernia though unable to demonstrate Rule out possible radicular pain, rule out colon lesion.  #2 history of sessile serrated adenomatous polyps-last colonoscopy May 2017 #3 history of small cell lung CA limited stage 2017, completed chemotherapy radiation and prophylactic brain radiation.  CT of the chest April 2020 no evidence of recurrence #4 COPD 5.  Bronchiectasis 6.  Coronary artery disease status post MI and PTCA-on chronic Plavix #7 Abdominal atherosclerosis #8 GERD stable on Protonix #9 gluteal cleft skin lesions-healing, most consistent with boil, resolving, no features consistent with herpes or shingles.  Plan; continue Protonix 40 mg p.o. daily Patient advised to take MiraLAX 17 g in 8 ounces of water every day. Start Tylenol 500 mg 1 p.o. 3 times daily for right lower quadrant pain, if helpful can continue Try moist heat on the right lower abdomen. CBC with differential, c-Met, sed rate, Patient will be scheduled for colonoscopy with Dr. JArdis Hughs  Procedure was discussed in detail with the patient including indications risks and benefits she is agreeable to proceed. Patient will need to hold Plavix for 5 days prior to colonoscopy.  We will communicate with her cardiologist Dr. HEllyn Hackto assure this is reasonable for this patient. If colonoscopy is unrevealing, consider lumbar spine imaging.  Add Balmex cream 2-3 times daily to gluteal cleft irritation/resolving boil.   Michele Yamin S Allix Blomquist PA-C 01/20/2019   Cc: HShirline Frees MD

## 2019-01-20 NOTE — Patient Instructions (Signed)
If you are age 65 or older, your body mass index should be between 23-30. Your Body mass index is 20.81 kg/m. If this is out of the aforementioned range listed, please consider follow up with your Primary Care Provider.  If you are age 63 or younger, your body mass index should be between 19-25. Your Body mass index is 20.81 kg/m. If this is out of the aformentioned range listed, please consider follow up with your Primary Care Provider.   You have been scheduled for a colonoscopy. Please follow written instructions given to you at your visit today.  Please pick up your prep supplies at the pharmacy within the next 1-3 days. If you use inhalers (even only as needed), please bring them with you on the day of your procedure. Your physician has requested that you go to www.startemmi.com and enter the access code given to you at your visit today. This web site gives a general overview about your procedure. However, you should still follow specific instructions given to you by our office regarding your preparation for the procedure.  We have sent the following medications to your pharmacy for you to pick up at your convenience: Golytely  Your provider has requested that you go to the basement level for lab work before leaving today. Press "B" on the elevator. The lab is located at the first door on the left as you exit the elevator.  You will be contacted by our office prior to your procedure for directions on holding your Plavix.  If you do not hear from our office 1 week prior to your scheduled procedure, please call [phone] to discuss.   Start MIralax 17 gm in 8 ounces of water daily.  Use heating pad on abdomen.  Take Tylenol 500 mg 1 three times daily for pain.  Use Balmex cream - apply to buttocks 2-3 times daily as needed.  (this is over-the-counter)  Thank you for choosing me and [facility].   Amy Esterwood, PA-C

## 2019-01-20 NOTE — Telephone Encounter (Signed)
[facility] Pre-operative Risk Assessment     Request for surgical clearance:     Endoscopy Procedure  What type of surgery is being performed?     Colonoscopy  When is this surgery scheduled?     02/01/19  What type of clearance is required ?   Pharmacy  Are there any medications that need to be held prior to surgery and how long? HOLD PLAVIX 5 DAYS PRIOR  Practice name and name of physician performing surgery?       Gastroenterology/ Dr. Ardis Hughs  What is your office phone and fax number?      Phone- [phone]  Fax- [phone] Attn: Peter Congo, RMA  Anesthesia type (None, local, MAC, general) ?       MAC Dr. [name] please advise.  Thanks, Peter Congo

## 2019-01-21 ENCOUNTER — Telehealth: Payer: Self-pay | Admitting: Physician Assistant

## 2019-01-21 ENCOUNTER — Telehealth: Payer: Self-pay | Admitting: Gastroenterology

## 2019-01-21 ENCOUNTER — Other Ambulatory Visit (INDEPENDENT_AMBULATORY_CARE_PROVIDER_SITE_OTHER): Payer: Medicare Other

## 2019-01-21 DIAGNOSIS — R1031 Right lower quadrant pain: Secondary | ICD-10-CM

## 2019-01-21 DIAGNOSIS — Z86010 Personal history of colonic polyps: Secondary | ICD-10-CM

## 2019-01-21 DIAGNOSIS — K5900 Constipation, unspecified: Secondary | ICD-10-CM

## 2019-01-21 DIAGNOSIS — Z1211 Encounter for screening for malignant neoplasm of colon: Secondary | ICD-10-CM

## 2019-01-21 LAB — CBC WITH DIFFERENTIAL/PLATELET
Basophils Absolute: 0 10*3/uL (ref 0.0–0.1)
Basophils Relative: 0.8 % (ref 0.0–3.0)
Eosinophils Absolute: 0 10*3/uL (ref 0.0–0.7)
Eosinophils Relative: 1 % (ref 0.0–5.0)
HCT: 42.1 % (ref 36.0–46.0)
Hemoglobin: 14.2 g/dL (ref 12.0–15.0)
Lymphocytes Relative: 13.4 % (ref 12.0–46.0)
Lymphs Abs: 0.6 10*3/uL — ABNORMAL LOW (ref 0.7–4.0)
MCHC: 33.8 g/dL (ref 30.0–36.0)
MCV: 92.4 fl (ref 78.0–100.0)
Monocytes Absolute: 0.3 10*3/uL (ref 0.1–1.0)
Monocytes Relative: 6.2 % (ref 3.0–12.0)
Neutro Abs: 3.8 10*3/uL (ref 1.4–7.7)
Neutrophils Relative %: 78.6 % — ABNORMAL HIGH (ref 43.0–77.0)
Platelets: 180 10*3/uL (ref 150.0–400.0)
RBC: 4.56 Mil/uL (ref 3.87–5.11)
RDW: 13.6 % (ref 11.5–15.5)
WBC: 4.8 10*3/uL (ref 4.0–10.5)

## 2019-01-21 LAB — COMPREHENSIVE METABOLIC PANEL
ALT: 9 U/L (ref 0–35)
AST: 13 U/L (ref 0–37)
Albumin: 4.5 g/dL (ref 3.5–5.2)
Alkaline Phosphatase: 72 U/L (ref 39–117)
BUN: 12 mg/dL (ref 6–23)
CO2: 26 mEq/L (ref 19–32)
Calcium: 9.7 mg/dL (ref 8.4–10.5)
Chloride: 104 mEq/L (ref 96–112)
Creatinine, Ser: 0.64 mg/dL (ref 0.40–1.20)
GFR: 93.18 mL/min (ref >60.00)
Glucose, Bld: 164 mg/dL — ABNORMAL HIGH (ref 70–99)
Potassium: 3.9 mEq/L (ref 3.5–5.1)
Sodium: 139 mEq/L (ref 135–145)
Total Bilirubin: 0.5 mg/dL (ref 0.2–1.2)
Total Protein: 6.6 g/dL (ref 6.0–8.3)

## 2019-01-21 LAB — URINALYSIS
Bilirubin Urine: NEGATIVE
Hgb urine dipstick: NEGATIVE
Ketones, ur: NEGATIVE
Leukocytes,Ua: NEGATIVE
Nitrite: NEGATIVE
Specific Gravity, Urine: 1.02 (ref 1.000–1.030)
Total Protein, Urine: NEGATIVE
Urine Glucose: NEGATIVE
Urobilinogen, UA: 0.2 (ref 0.0–1.0)
pH: 5.5 (ref 5.0–8.0)

## 2019-01-21 LAB — SEDIMENTATION RATE: Sed Rate: 18 mm/hr (ref 0–30)

## 2019-01-21 NOTE — Telephone Encounter (Signed)
See additional phone note. 

## 2019-01-21 NOTE — Progress Notes (Signed)
I agree with the above note, plan 

## 2019-01-21 NOTE — Telephone Encounter (Signed)
Ok to add UA per Dr. Wallis Bamberg).  Order has been added. Lab has been informed.

## 2019-01-22 ENCOUNTER — Telehealth: Payer: Self-pay | Admitting: Physician Assistant

## 2019-01-22 NOTE — Telephone Encounter (Signed)
Dr Ardis Hughs please advise on labs results. Amy Trellis Paganini is out of the office.

## 2019-01-23 NOTE — Telephone Encounter (Signed)
Okay to hold Plavix 5 days prior to surgery.  [place]

## 2019-01-24 NOTE — Telephone Encounter (Signed)
Her labs (cbc, cmet, sed rate, UA) were all essentially normal

## 2019-01-25 NOTE — Telephone Encounter (Signed)
Patient is advised of this. She requests a copy go to her PCP. She asks when she will hear back about her Plavix being held prior to her colonoscopy. Assured her she will be contacted with the information when received by her CMA.

## 2019-01-25 NOTE — Telephone Encounter (Signed)
Patient advised to hold Plavix 5 days prior to her procedure.  Patient verbalized understanding.

## 2019-01-26 ENCOUNTER — Telehealth: Payer: Self-pay | Admitting: Gastroenterology

## 2019-01-26 NOTE — Telephone Encounter (Signed)
Pt has questions regarding "what was discussed in [place]."

## 2019-01-26 NOTE — Telephone Encounter (Signed)
Michele [name] has been talking with the pt on My Chart.  Please send accordingly

## 2019-01-27 NOTE — Telephone Encounter (Signed)
No answer. Left message to call back if she still has any questions.

## 2019-01-27 NOTE — Telephone Encounter (Signed)
Spoke with the patient. She had more questions on the glucose and the abnormal values in her CBC. Again encouraged her to discuss the glucose and her concerns of her CBC with her PCP.

## 2019-01-29 ENCOUNTER — Telehealth: Payer: Self-pay | Admitting: Gastroenterology

## 2019-01-29 NOTE — Telephone Encounter (Signed)
Covid-19 screening questions     Do you now or have you had a fever in the last 14 days?  NO   Do you have any respiratory symptoms of shortness of breath or cough now or in the last 14 days?  NO   Do you have any family members or close contacts with diagnosed or suspected Covid-19 in the past 14 days? NO   Have you been tested for Covid-19 and found to be positive? NO  Advised to check in on the 4th floor and that care partner may wait in the car or come up to the lobby during procedure. Also made aware that both must enter the building wearing a mask.

## 2019-02-01 ENCOUNTER — Other Ambulatory Visit: Payer: Self-pay

## 2019-02-01 ENCOUNTER — Encounter: Payer: Self-pay | Admitting: Gastroenterology

## 2019-02-01 ENCOUNTER — Ambulatory Visit (AMBULATORY_SURGERY_CENTER): Payer: Medicare Other | Admitting: Gastroenterology

## 2019-02-01 VITALS — BP 126/54 | HR 58 | Temp 98.3°F | Resp 17 | Ht 64.0 in | Wt 123.0 lb

## 2019-02-01 DIAGNOSIS — R1031 Right lower quadrant pain: Secondary | ICD-10-CM | POA: Diagnosis not present

## 2019-02-01 DIAGNOSIS — K649 Unspecified hemorrhoids: Secondary | ICD-10-CM | POA: Diagnosis not present

## 2019-02-01 DIAGNOSIS — Z86010 Personal history of colonic polyps: Secondary | ICD-10-CM | POA: Diagnosis not present

## 2019-02-01 MED ORDER — SODIUM CHLORIDE 0.9 % IV SOLN: 500.0000 mL | Freq: Once | INTRAVENOUS | Status: DC

## 2019-02-01 NOTE — Patient Instructions (Addendum)
Resume your plavix today.  Try to take miralax every day. Eat more fiber (fibercon daily) (you have a handout). Call in a month to let us know your progress.   YOU HAD AN ENDOSCOPIC PROCEDURE TODAY AT [facility]:   Refer to the procedure report that was given to you for any specific questions about what was found during the examination.  If the procedure report does not answer your questions, please call your gastroenterologist to clarify.  If you requested that your care partner not be given the details of your procedure findings, then the procedure report has been included in a sealed envelope for you to review at your convenience later.  YOU SHOULD EXPECT: Some feelings of bloating in the abdomen. Passage of more gas than usual.  Walking can help get rid of the air that was put into your GI tract during the procedure and reduce the bloating. If you had a lower endoscopy (such as a colonoscopy or flexible sigmoidoscopy) you may notice spotting of blood in your stool or on the toilet paper. If you underwent a bowel prep for your procedure, you may not have a normal bowel movement for a few days.  Please Note:  You might notice some irritation and congestion in your nose or some drainage.  This is from the oxygen used during your procedure.  There is no need for concern and it should clear up in a day or so.  SYMPTOMS TO REPORT IMMEDIATELY:   Following lower endoscopy (colonoscopy or flexible sigmoidoscopy):  Excessive amounts of blood in the stool  Significant tenderness or worsening of abdominal pains  Swelling of the abdomen that is new, acute  Fever of 100F or higher   For urgent or emergent issues, a gastroenterologist can be reached at any hour by calling [phone].   DIET:  We do recommend a small meal at first, but then you may proceed to your regular diet.  Drink plenty of fluids but you should avoid alcoholic beverages for 24 hours. Try to increase the fiber in  your diet, and drink plenty of water.  ACTIVITY:  You should plan to take it easy for the rest of today and you should NOT DRIVE or use heavy machinery until tomorrow (because of the sedation medicines used during the test).    FOLLOW UP: Our staff will call the number listed on your records 48-72 hours following your procedure to check on you and address any questions or concerns that you may have regarding the information given to you following your procedure. If we do not reach you, we will leave a message.  We will attempt to reach you two times.  During this call, we will ask if you have developed any symptoms of COVID 19. If you develop any symptoms (ie: fever, flu-like symptoms, shortness of breath, cough etc.) before then, please call [phone].  If you test positive for Covid 19 in the 2 weeks post procedure, please call and report this information to [place].    If any biopsies were taken you will be contacted by phone or by letter within the next 1-3 weeks.  Please call us at [phone] if you have not heard about the biopsies in 3 weeks.    SIGNATURES/CONFIDENTIALITY: You and/or your care partner have signed paperwork which will be entered into your electronic medical record.  These signatures attest to the fact that that the information above on your After Visit Summary has been reviewed and  is understood.  Full responsibility of the confidentiality of this discharge information lies with you and/or your care-partner.

## 2019-02-01 NOTE — Progress Notes (Signed)
[name] states no medical or surgical changes since previsit or office visit.  JB temps and  vitals. SM

## 2019-02-01 NOTE — Op Note (Signed)
[facility] Patient Name: Michele [name] Procedure Date: 02/01/2019 1:31 PM MRN: [id number] Endoscopist: Milus Banister , MD Age: 63 Referring MD:  Date of Birth: [birth year] Gender: Female Account #: [id number] Procedure:                Colonoscopy Indications:              Lower abdominal pain; cbc, cmet, sed rate, CT scan                            01/2019 unrevealing; also CT scan10/2019 unrevealing                            (similar pains) Medicines:                Monitored Anesthesia Care Procedure:                Pre-Anesthesia Assessment:                           - Prior to the procedure, a History and Physical                            was performed, and patient medications and                            allergies were reviewed. The patient's tolerance of                            previous anesthesia was also reviewed. The risks                            and benefits of the procedure and the sedation                            options and risks were discussed with the patient.                            All questions were answered, and informed consent                            was obtained. Prior Anticoagulants: The patient has                            taken Plavix (clopidogrel), last dose was 5 days                            prior to procedure. ASA Grade Assessment: III - A                            patient with severe systemic disease. After                            reviewing the risks and benefits, the patient was  deemed in satisfactory condition to undergo the                            procedure.                           After obtaining informed consent, the colonoscope                            was passed under direct vision. Throughout the                            procedure, the patient's blood pressure, pulse, and                            oxygen saturations were monitored continuously. The       Colonoscope was introduced through the anus and                            advanced to the the terminal ileum. The colonoscopy                            was performed without difficulty. The patient                            tolerated the procedure well. The quality of the                            bowel preparation was good. The terminal ileum,                            ileocecal valve, appendiceal orifice, and rectum                            were photographed. Scope In: 1:42:17 PM Scope Out: 1:53:32 PM Scope Withdrawal Time: 0 hours 6 minutes 50 seconds  Total Procedure Duration: 0 hours 11 minutes 15 seconds  Findings:                 Normal terminal ileum.                           External and internal hemorrhoids were found. The                            hemorrhoids were medium-sized.                           The exam was otherwise without abnormality on                            direct and retroflexion views. Complications:            No immediate complications. Estimated blood loss:  None. Estimated Blood Loss:     Estimated blood loss: none. Impression:               - External and internal hemorrhoids.                           - Normal terminal ileum.                           - The examination was otherwise normal on direct                            and retroflexion views.                           - These findings do not explain your lower                            abdominal pain, please follow up with your PCP                            about that to consider further testing. It seems                            unlikely that the pains are related to your GI                            tract given the recent CT, labs and this                            examination. Recommendation:           - Patient has a contact number available for                            emergencies. The signs and symptoms of potential                             delayed complications were discussed with the                            patient. Return to normal activities tomorrow.                            Written discharge instructions were provided to the                            patient.                           - Resume previous diet.                           - Continue present medications. Try taking miralax  every single day and also fiber supplement every                            single day to see if that helps your constipation.                            Call in 1 month to report on your response.                           - Ok to resume plavix today.                           - recall colonoscopy in 5 years given 2015 SSPs Milus Banister, MD 02/01/2019 2:02:06 PM This report has been signed electronically.

## 2019-02-01 NOTE — Progress Notes (Signed)
Dr Ardis Hughs explained the results at least three times to the patient. She keeps repeating her questions.  She keeps asking why she is still having pain in her side.  The doctor stated for her to take fiber con and miralax.  The patient wants to know why she has "all of this."  Husband is present so patient doesn't fall.Patient was given a handout about banding. paatient continued to ask questions and repeat herself up until discharge.

## 2019-02-01 NOTE — Progress Notes (Signed)
PT taken to PACU. Monitors in place. VSS. Report given to RN. 

## 2019-02-03 ENCOUNTER — Telehealth: Payer: Self-pay

## 2019-02-03 NOTE — Telephone Encounter (Signed)
Left voice mail

## 2019-02-03 NOTE — Telephone Encounter (Signed)
The pt had questions regarding the long term side effects of protonix with taking other medications.  The pt was advised to call her pharmacy and discuss her meds and interactions if any.

## 2019-02-03 NOTE — Telephone Encounter (Signed)
  Follow up Call-  Call back number 02/01/2019  Post procedure Call Back phone  # [phone]  Permission to leave phone message Yes  Some recent data might be hidden     Patient questions:  Do you have a fever, pain , or abdominal swelling? No. Pain Score  0 *  Have you tolerated food without any problems? Yes.    Have you been able to return to your normal activities? Yes.    Do you have any questions about your discharge instructions: Diet   No. Medications  No. Follow up visit  No.  Do you have questions or concerns about your Care? No.  Actions: * If pain score is 4 or above: No action needed, pain <4.  Have you developed a fever since your procedure? No 2.   Have you had an respiratory symptoms (SOB or cough) since your procedure? No  3.   Have you tested positive for COVID 19 since your procedure No  4.   Have you had any family members/close contacts diagnosed with the COVID 19 since your procedure? No   If yes to any of these questions please route to Joylene John, RN and Alphonsa Gin, RN.

## 2019-02-10 ENCOUNTER — Telehealth: Payer: Self-pay | Admitting: Gastroenterology

## 2019-02-10 NOTE — Telephone Encounter (Signed)
The pt has a history of internal hemorrhoids and constipation.  She had a very hard stool today and had to use her hand to get all the stool out from her rectum.  When she stood up she had a moderate amount of BRB.  After that episode the bleeding has stopped.  She is taking 1 cap of miralax daily. She was advised to begin fiber at her procedure but never started it.  She will begin that today and will increase miralax to 2 caps daily.  She will call back if this does not help relieve the constipation.  Also, if she develops any further rectal bleeding that does not resolve she will go to the ED.  She will call with an update.

## 2019-02-10 NOTE — Telephone Encounter (Signed)
Pt states that she had an episode of heavy rectal bleeding today and would like some advise. Pls call her.

## 2019-02-10 NOTE — Telephone Encounter (Signed)
The pt has been advised that she can use the recticare as prescribed.  She was also again told to increase her miralax to prevent constipation.  The pt has been advised of the information and verbalized understanding.

## 2019-02-11 IMAGING — CR DG CHEST 2V
2 series · 2 of 2 positions shown · non-contrast
Comparison: 05/15/2016

CLINICAL DATA: Mid chest pain.  History of lung cancer.

EXAM:
CHEST  2 VIEW

[w chest pa]
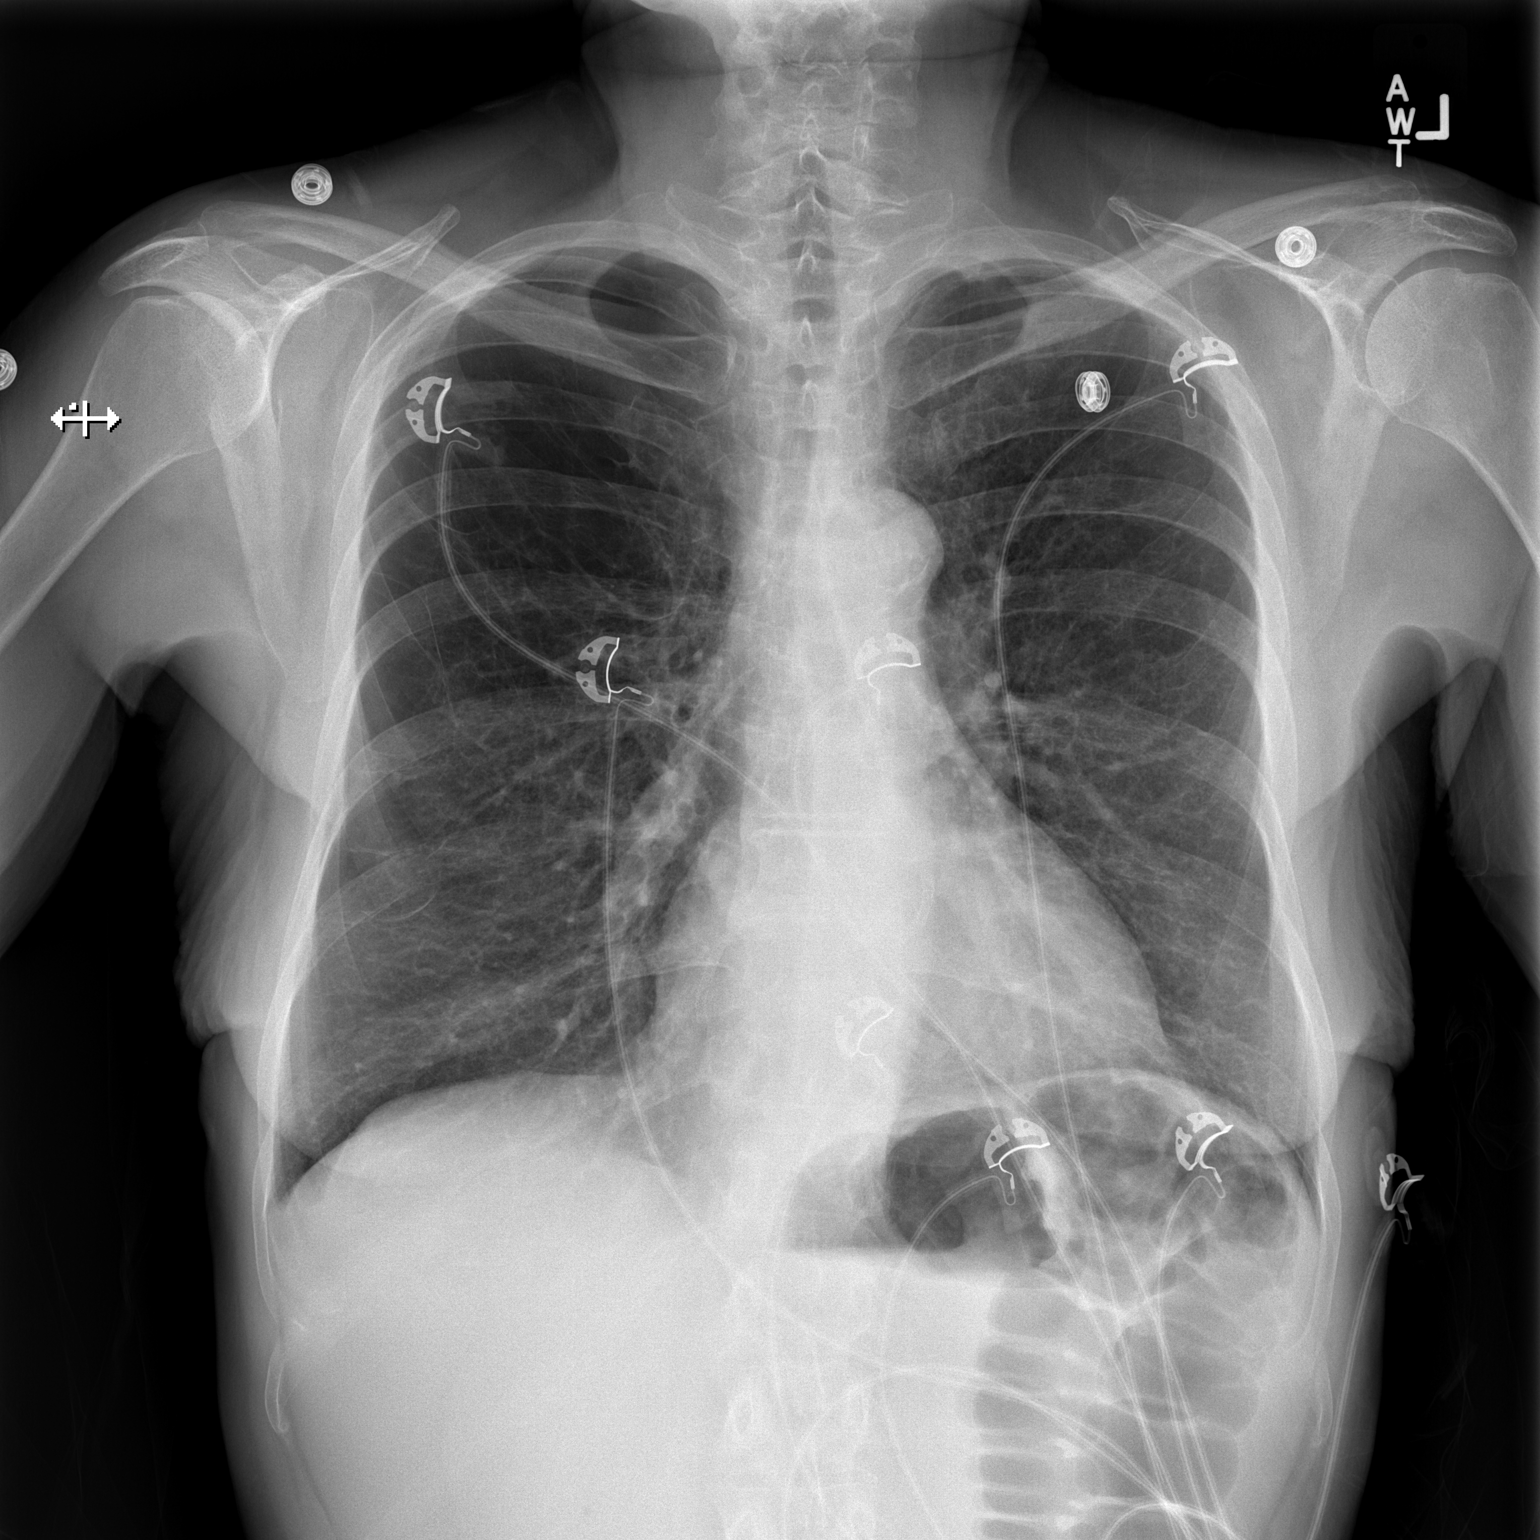

[w chest lat]
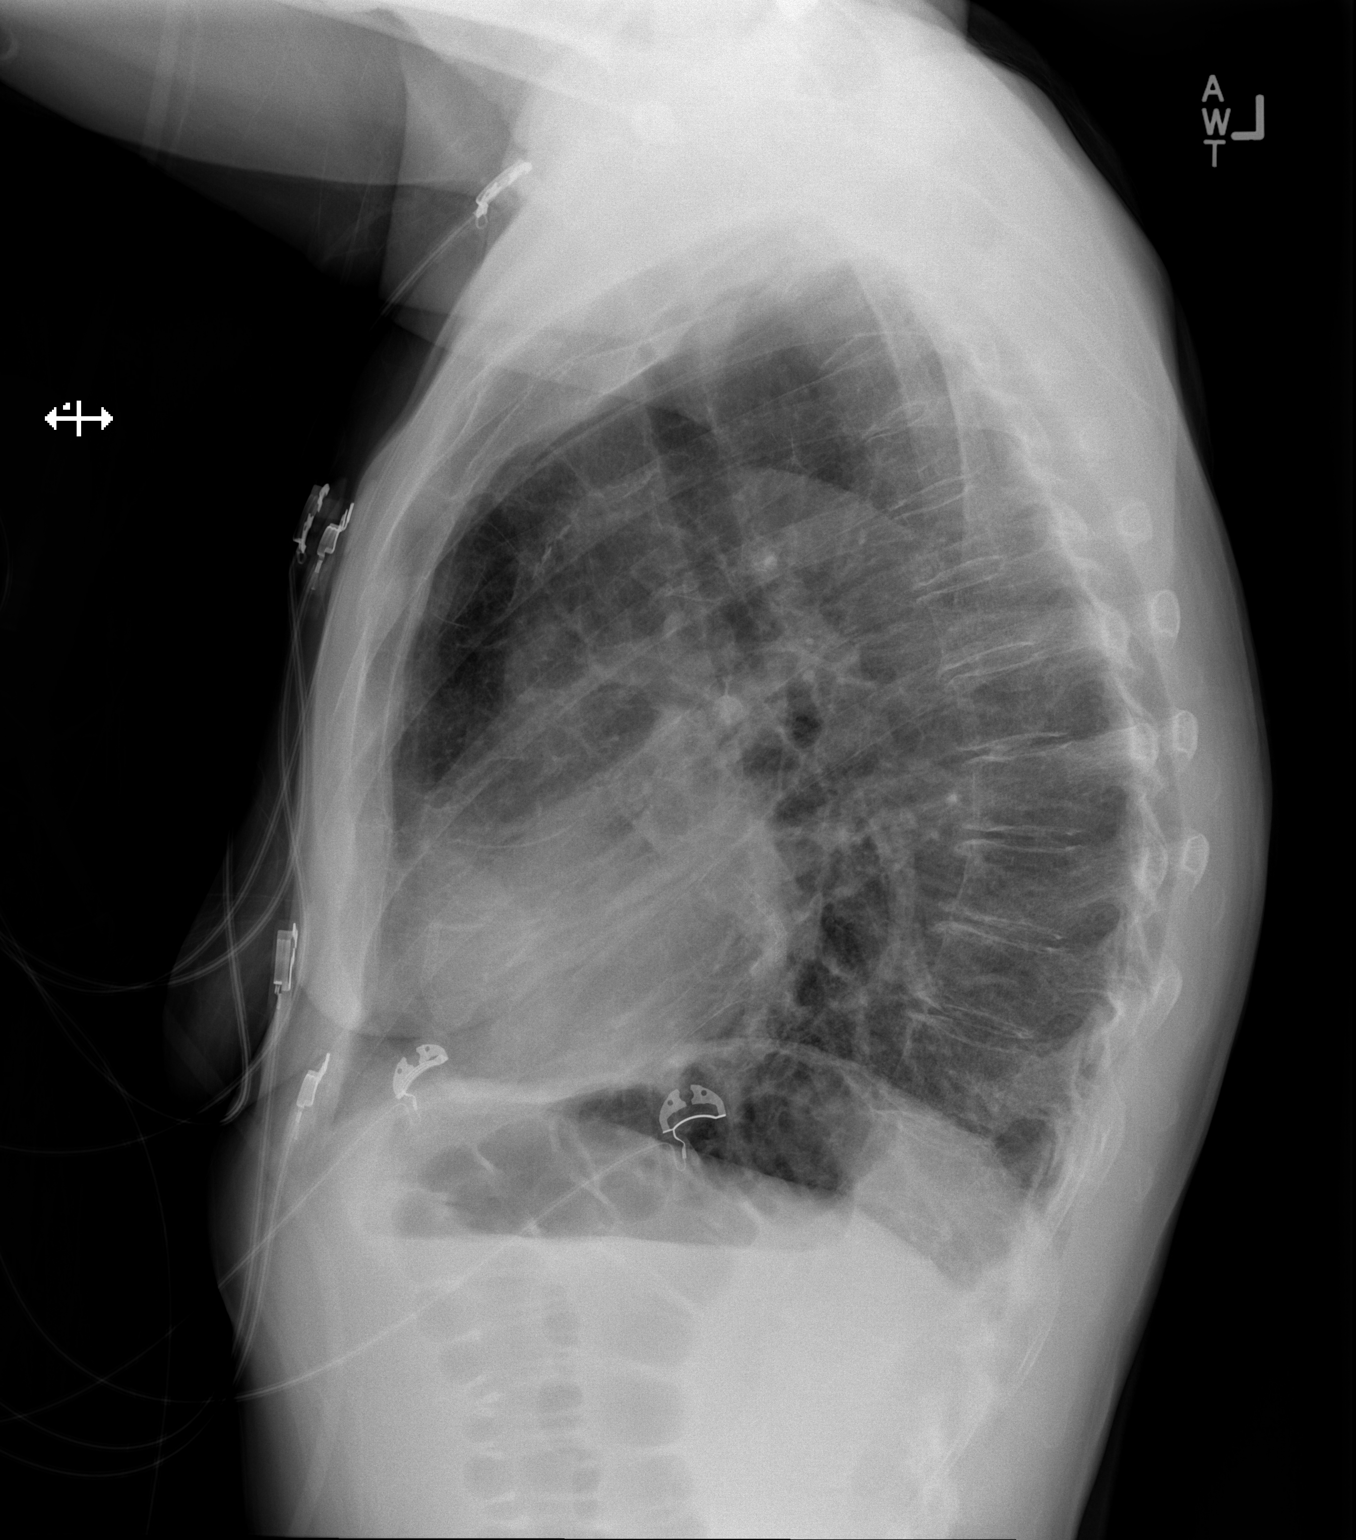

[2 of 2 positions shown; findings below may reference images not displayed]

FINDINGS: There is hyperinflation of the lungs compatible with COPD. Heart and
mediastinal contours are within normal limits. No focal opacities or
effusions. No acute bony abnormality.
IMPRESSION: COPD.  No active disease.

## 2019-02-11 IMAGING — CT CT ABD-PELV W/ CM
2 of 5 series · 16 of 46 positions shown, 18 images · IV contrast (ISOVUE)
Comparison: CT, 08/24/2016

CLINICAL DATA: Pt presents to [facility] via [REDACTED] from home for
complaints for recurrent abdominal pain rated as [DATE]. She describes
the pain as sharp and generalized in location. She denies vomiting
or diarrhea. She is a cancer pt and has not had recent radiation or
chemo. Hx lung cancer, htn.

EXAM:
CT ABDOMEN AND PELVIS WITH CONTRAST
TECHNIQUE: Multidetector CT imaging of the abdomen and pelvis was performed
using the standard protocol following bolus administration of
intravenous contrast.
CONTRAST:  100mL ZHOKU1-KKK IOPAMIDOL (ZHOKU1-KKK) INJECTION 61%

[Series 2: abd/pel with · axial · 0.69mm/px · z∈[+1018,+1368]mm · 13 of 81 slices shown, 15 images]
[im 6/81  soft-tissue]
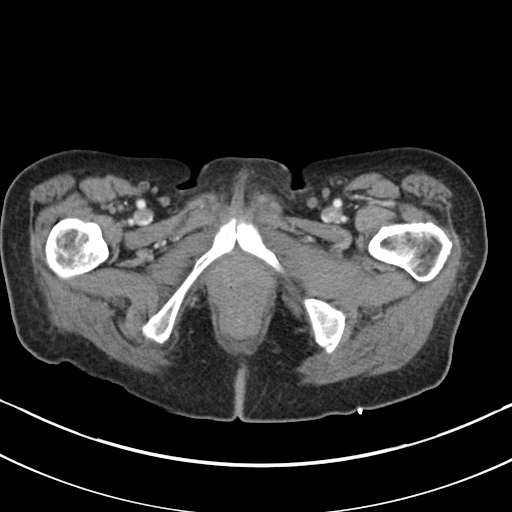
[im 6/81  bone]
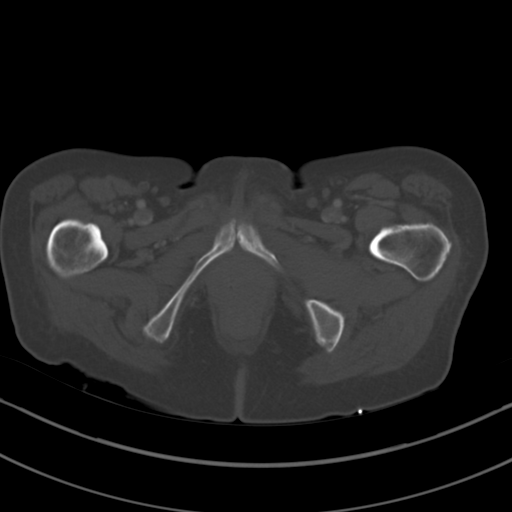
[im 11/81  soft-tissue]
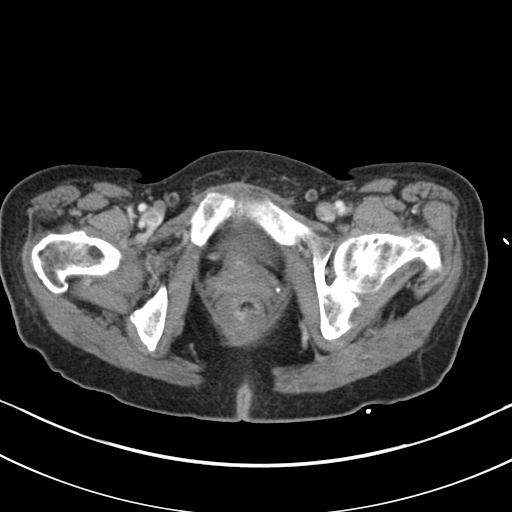
[im 16/81  soft-tissue]
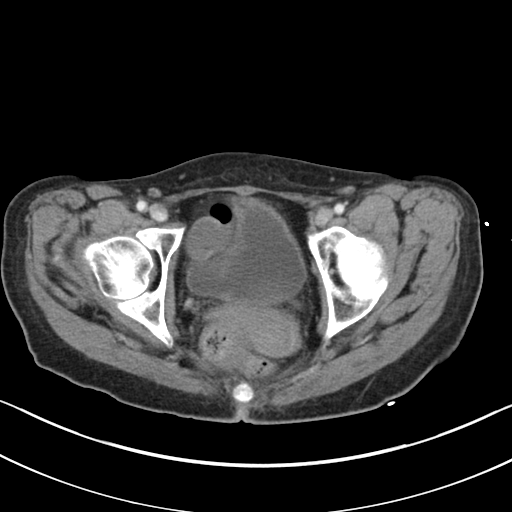
[im 26/81  soft-tissue]
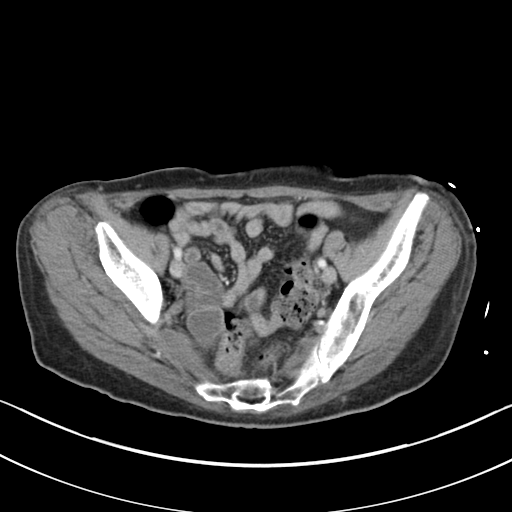
[im 31/81  soft-tissue]
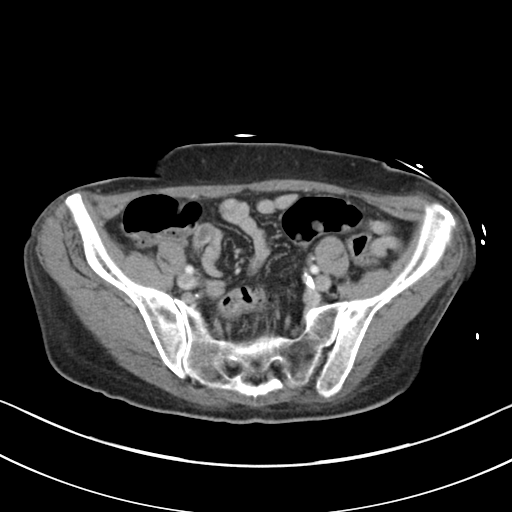
[im 36/81  soft-tissue]
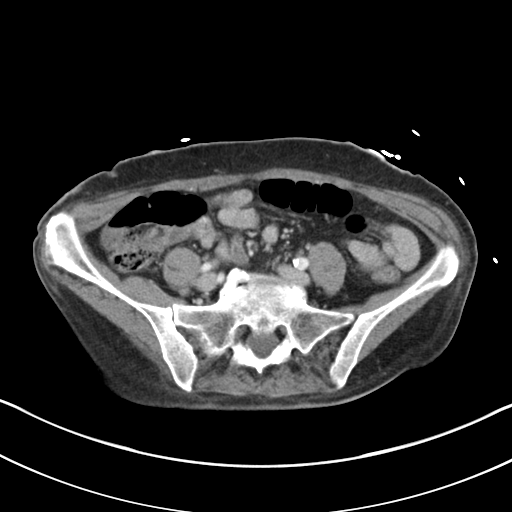
[im 41/81  soft-tissue]
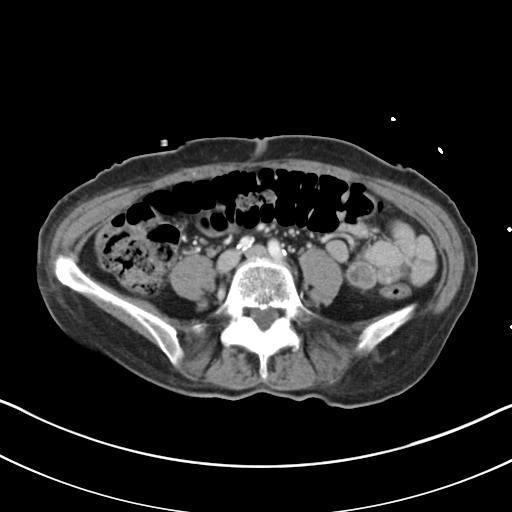
[im 46/81  soft-tissue]
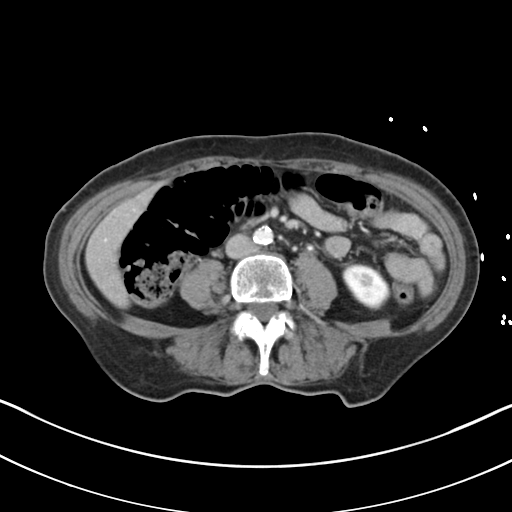
[im 51/81  soft-tissue]
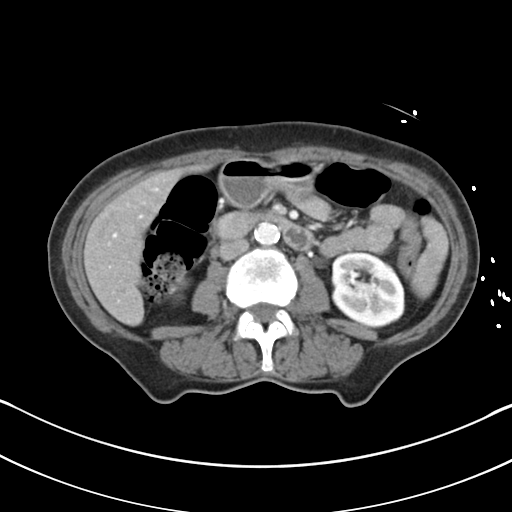
[im 51/81  bone]
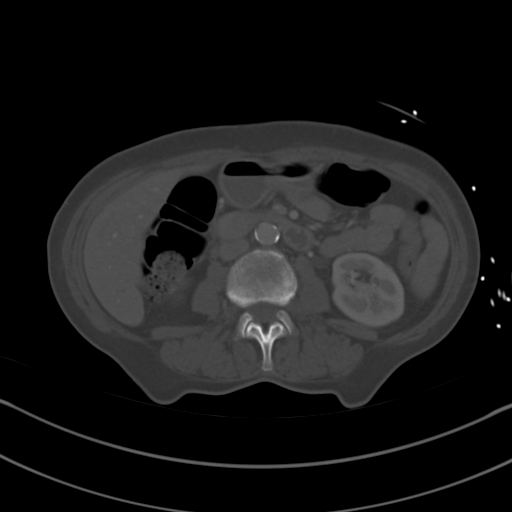
[im 56/81  soft-tissue]
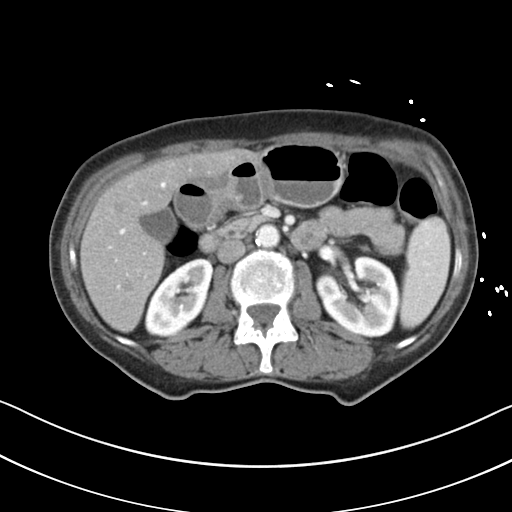
[im 66/81  soft-tissue]
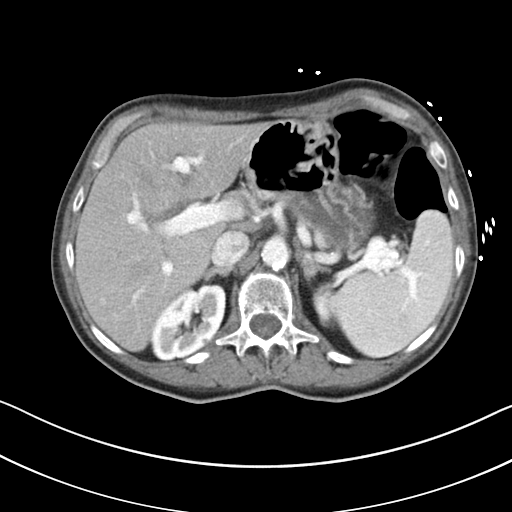
[im 71/81  soft-tissue]
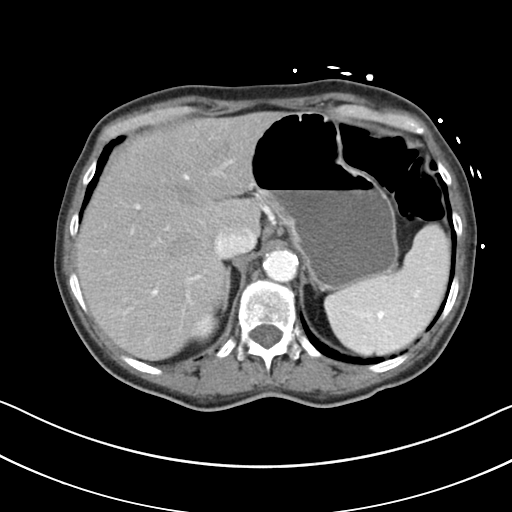
[im 76/81  soft-tissue]
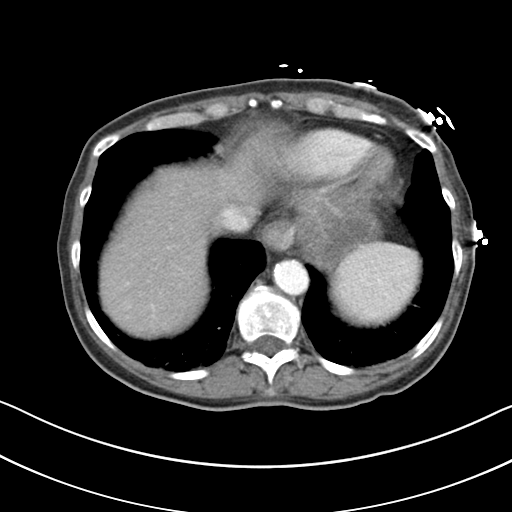

[Series 3: coronal a/|p · coronal · 0.76mm/px · 3 of 112 slices shown]
[im 38/112  soft-tissue]
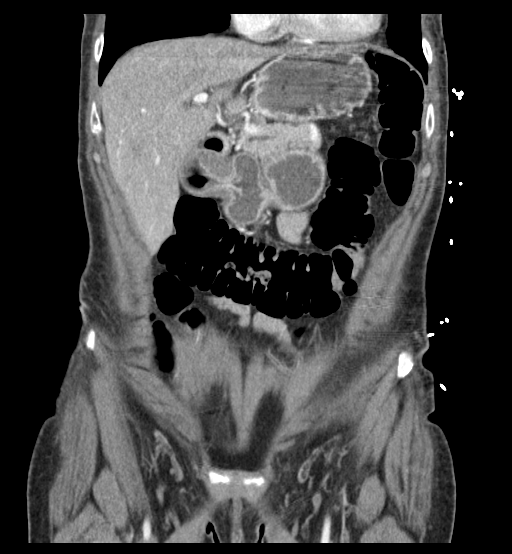
[im 50/112  soft-tissue]
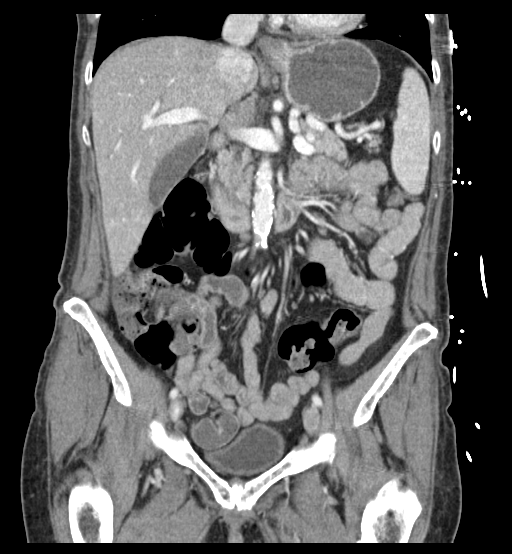
[im 62/112  soft-tissue]
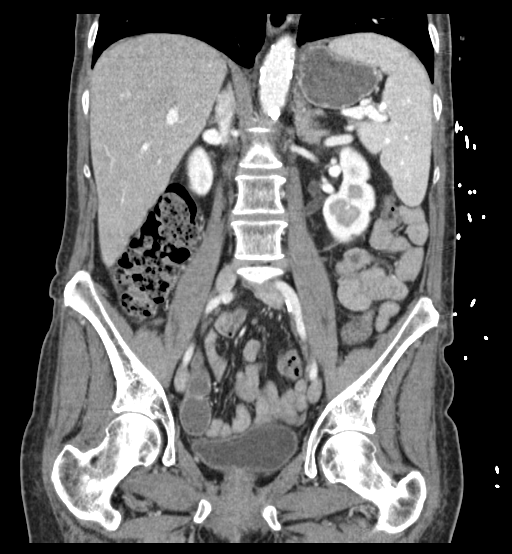

[16 of 46 positions shown; findings below may reference images not displayed]

FINDINGS: Lower chest: No acute findings. Centrilobular emphysema. Minor
dependent subsegmental atelectasis. No lung base mass or nodule. No
pleural effusion. Heart is normal in size.

Hepatobiliary: No focal liver abnormality is seen. No gallstones,
gallbladder wall thickening, or biliary dilatation.

Pancreas: Unremarkable. No pancreatic ductal dilatation or
surrounding inflammatory changes.

Spleen: Top normal in size, stable from the prior CT comminution 13
cm in greatest dimension. No splenic mass or focal lesion.

Adrenals/Urinary Tract: Subcentimeter adrenal nodules are noted,
stable prior CTs dating back to 11/04/2014. 16 mm cyst along the
posterior midpole the right kidney. 3-4 mm adjacent low-density
lesion is incompletely characterized due to its small size but is
likely also cysts. These are stable. No other renal masses or
lesions. No stones. No hydronephrosis. Normal ureters. Bladder is
unremarkable.

Stomach/Bowel: Stomach is within normal limits. Appendix appears
normal. No evidence of bowel wall thickening, distention, or
inflammatory changes.

Vascular/Lymphatic: Aortic atherosclerosis. No enlarged abdominal or
pelvic lymph nodes.

Reproductive: Uterus and bilateral adnexa are unremarkable.

Other: No abdominal wall hernia or abnormality. No abdominopelvic
ascites.

Musculoskeletal: No fracture or acute finding. No osteoblastic or
osteolytic lesions.
IMPRESSION: 1. No acute findings.  No findings to account for abdominal pain.
2. No evidence of metastatic disease below the diaphragm. Small
adrenal nodules have been stable consistent with adenomas.
3. Aortic atherosclerosis.

## 2019-02-12 ENCOUNTER — Telehealth: Payer: Self-pay | Admitting: Gastroenterology

## 2019-02-12 NOTE — Telephone Encounter (Signed)
Left message on machine to call back  

## 2019-02-12 NOTE — Telephone Encounter (Signed)
Pt states that her throat still hurst, she wants to know what she can do.

## 2019-02-15 NOTE — Telephone Encounter (Signed)
Several messages left with no return call.  Will await for further communication from the pt

## 2019-02-21 ENCOUNTER — Encounter: Payer: Self-pay | Admitting: Internal Medicine

## 2019-02-25 DIAGNOSIS — M79671 Pain in right foot: Secondary | ICD-10-CM | POA: Diagnosis not present

## 2019-02-25 DIAGNOSIS — M79672 Pain in left foot: Secondary | ICD-10-CM | POA: Diagnosis not present

## 2019-02-25 DIAGNOSIS — S92355A Nondisplaced fracture of fifth metatarsal bone, left foot, initial encounter for closed fracture: Secondary | ICD-10-CM | POA: Diagnosis not present

## 2019-02-26 IMAGING — RF DG UGI W/ SMALL BOWEL HIGH DENSITY
13 of 20 series · 14 of 24 positions shown · non-contrast
Comparison: CT Abdomen and Pelvis 10/14/2016 and earlier

CLINICAL DATA: 60-year-old female with unexplained epigastric and
abdominal pain that is frequent but intermittent. Small cell lung
cancer with no abdominal or pelvic metastatic disease to this point.

EXAM:
UPPER GI SERIES WITH SMALL BOWEL FOLLOW-THROUGH
FLUOROSCOPY TIME:  Fluoroscopy Time:  2 minutes 36 seconds
Radiation Exposure Index (if provided by the fluoroscopic device):
Number of Acquired Spot Images: 0
TECHNIQUE: Combined double contrast and single contrast upper GI series using
effervescent crystals, thick barium, and thin barium. Subsequently,
serial images of the small bowel were obtained including spot views
of the terminal ileum.

[Series 1: t abdomen supine · 0.15mm/px · 1 of 1 slices shown]
[im 1/1]
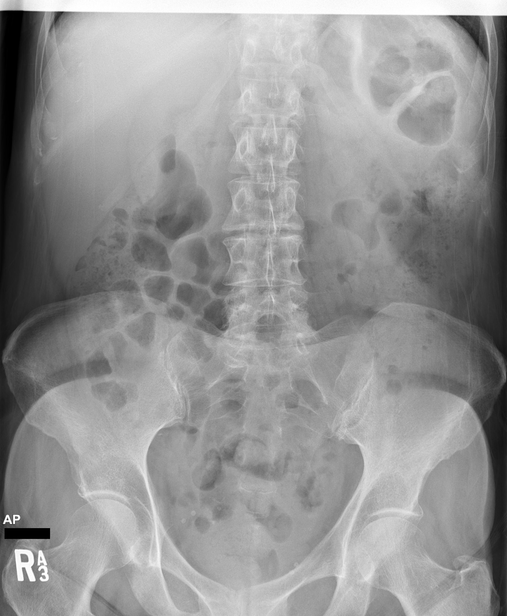

[Series 2: cp_standard · 0.36mm/px · 1 of 88 frames shown (1 of 12)]
[frame 45/88]
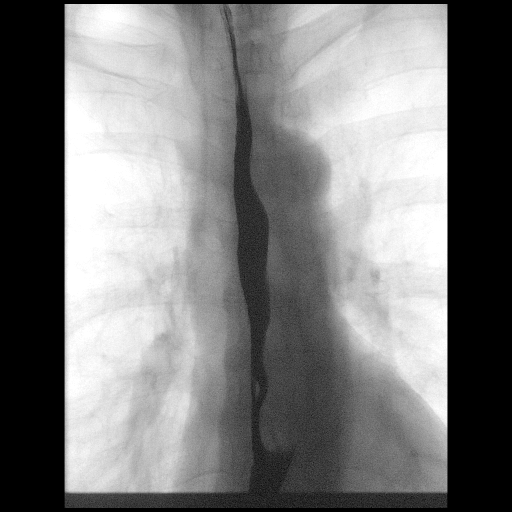

[Series 3: cp_standard · 0.18mm/px · 1 of 1 slices shown (2 of 12)]
[im 1/1]
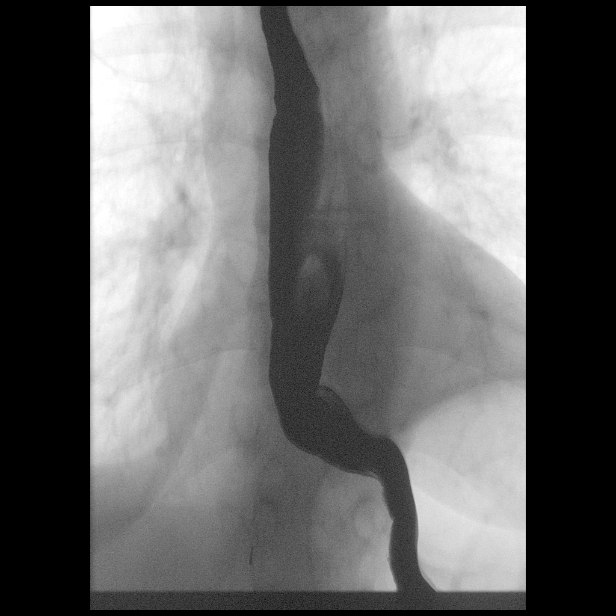

[Series 6: cp_standard · 0.18mm/px · 1 of 1 slices shown (3 of 12)]
[im 1/1]
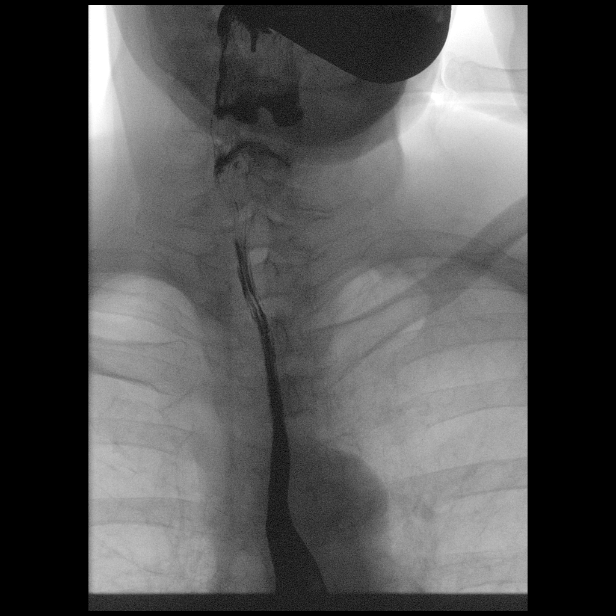

[Series 7: cp_standard · 0.36mm/px · 2 of 58 frames shown (4 of 12)]
[frame 1/58]
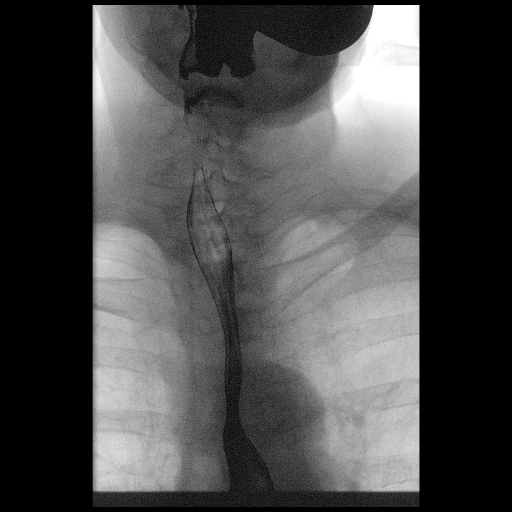
[frame 30/58]
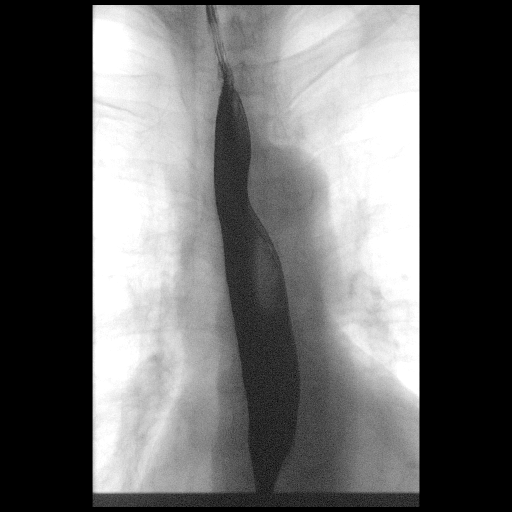

[Series 9: cp_standard · 0.18mm/px · 1 of 1 slices shown (5 of 12)]
[im 1/1]
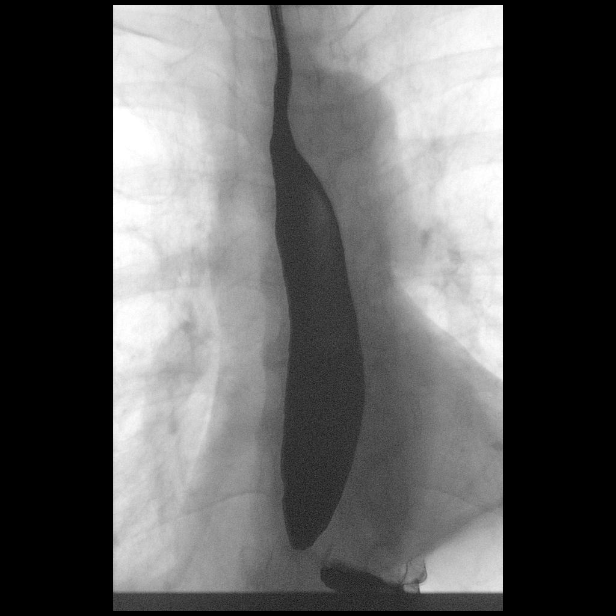

[Series 10: cp_standard · 0.18mm/px · 1 of 1 slices shown (6 of 12)]
[im 1/1]
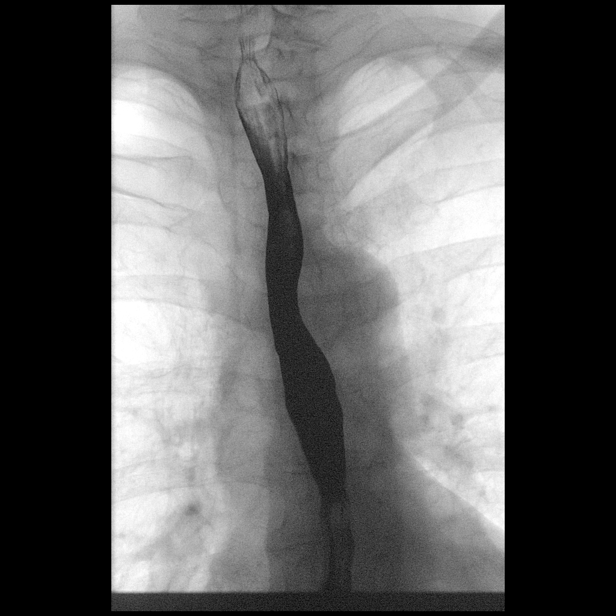

[Series 13: cp_standard · 0.18mm/px · 1 of 1 slices shown (7 of 12)]
[im 1/1]
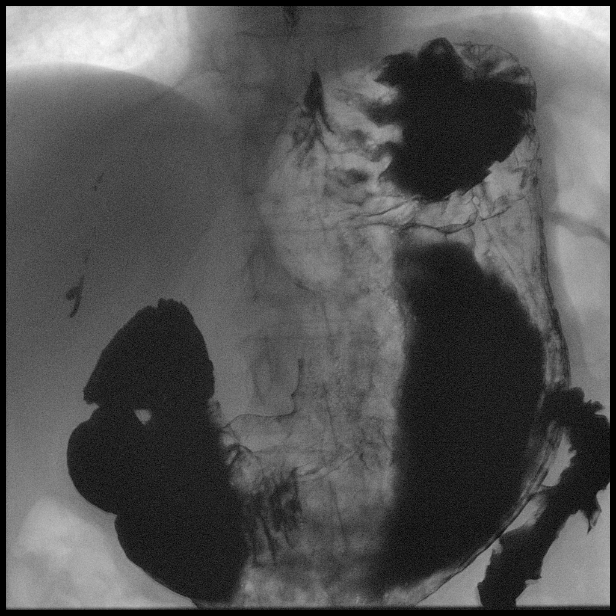

[Series 15: cp_standard · 0.17mm/px · 1 of 1 slices shown (8 of 12)]
[im 1/1]
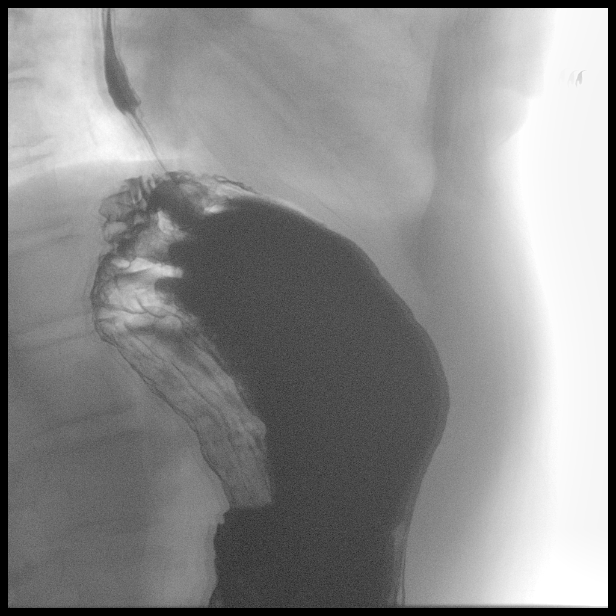

[Series 18: cp_standard · 0.18mm/px · 1 of 1 slices shown (9 of 12)]
[im 1/1]
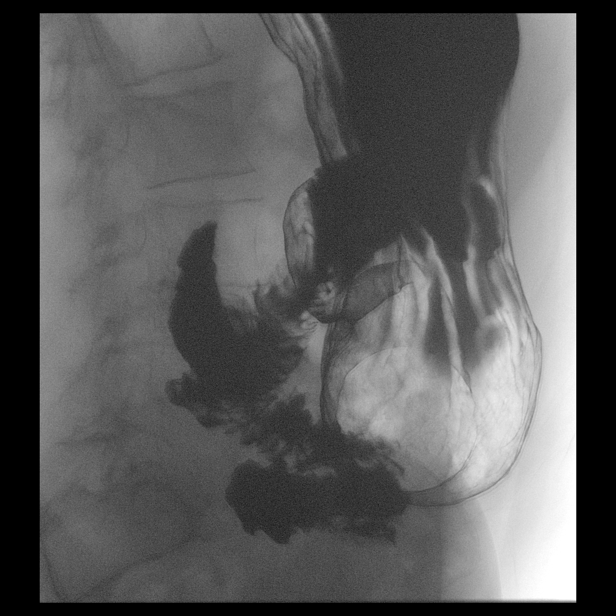

[Series 19: cp_standard · 0.18mm/px · 1 of 1 slices shown (10 of 12)]
[im 1/1]
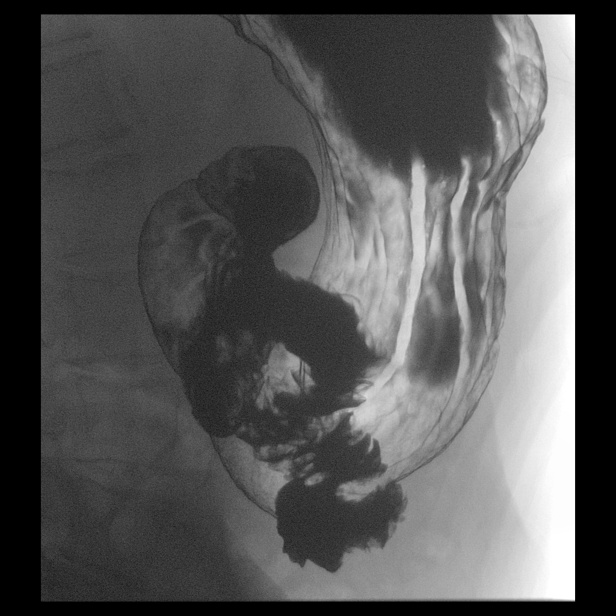

[Series 21: cp_standard · 0.18mm/px · 1 of 1 slices shown (11 of 12)]
[im 1/1]
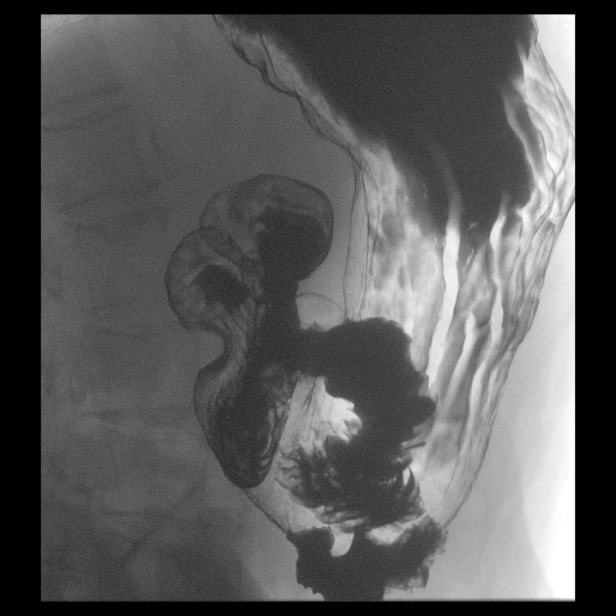

[Series 24: cp_standard · 0.18mm/px · 1 of 1 slices shown (12 of 12)]
[im 1/1]
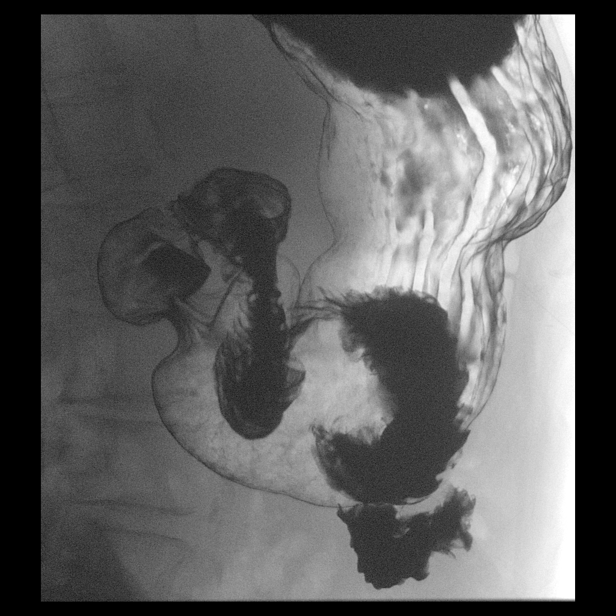

[14 of 24 positions shown; findings below may reference images not displayed]

FINDINGS: Preprocedural scout view of the abdomen demonstrates a normal bowel
gas pattern. No acute osseous abnormality identified. Abdominal and
pelvic visceral contours are within normal limits. Several small
pelvic phleboliths are re- demonstrated.

A double contrast study was undertaken and the patient tolerated
this well and without difficulty.

No obstruction to the forward flow of contrast throughout the
esophagus and into the stomach. Normal esophageal course and
contour. Normal esophageal mucosal pattern. On prone swallows mild
tertiary contractions occurred in the distal esophagus. Esophageal
motility is otherwise normal for age.

Good gastric distention and coating with barium. Prompt gastric
emptying.

Questionable rugal fold thickening in the proximal third of the
stomach. Gastric mucosal pattern is otherwise normal. Normal
gastroesophageal junction. Normal gastric contour. Normal antrum and
duodenum bulb. Normal duodenum course and contour. Normal duodenum
mucosal pattern.

Isolated brief spontaneous gastroesophageal reflux was observed with
belching during the first portion of the study (series 32, image 1)
and there was feline esophagus demonstrated at that time.

Small-bowel follow-through was then undertaken.

On a 30 minutes delayed image the majority of small bowel was
opacified, but contrast had not yet reached the terminal ileum. The
visible loops appear normal. The visible stomach and duodenum appear
normal.

On a 1 hour delayed image barium had reached the splenic flexure.
The small bowel loops and proximal colon appear unremarkable.
Fluoroscopic evaluation of the small bowel was then performed.
Normal small bowel course and contour demonstrated throughout. The
terminal ileum was difficult to delineate, is probably the loops
seen on series 55, and all distal small bowel loops appear normal.
IMPRESSION: 1. Consider the possibility of gastritis as there is mild rugal fold
thickening in the proximal third of the stomach.
2. Otherwise negative stomach and duodenum.
3. Mild presbyesophagus. Isolated and brief gastroesophageal reflux
occurred but significance is doubtful.
4. Normal small bowel follow-through. Contrast transit time to the
large bowel is between 30 minutes and 1 hour.

## 2019-03-03 ENCOUNTER — Telehealth: Payer: Self-pay | Admitting: Pulmonary Disease

## 2019-03-03 NOTE — Telephone Encounter (Signed)
Left message for patient to call back  

## 2019-03-04 ENCOUNTER — Telehealth: Payer: Self-pay | Admitting: Medical Oncology

## 2019-03-04 ENCOUNTER — Encounter: Payer: Self-pay | Admitting: Pulmonary Disease

## 2019-03-04 HISTORY — PX: OTHER SURGICAL HISTORY: SHX169

## 2019-03-04 NOTE — Telephone Encounter (Signed)
I would prefer for her to take regular Mucinex.  No D.  I would prefer the patient complete a MyChart a tele-visit to further evaluate her symptoms.  But we will also route this message to Dr. Halford Chessman as this is his patient.  Pt is due for follow up. Last seen in Feb/2020 was due for a 3 month follow up. Please schedule with VS  Wyn Quaker FNP

## 2019-03-04 NOTE — Telephone Encounter (Signed)
Called spoke with patient. She does not want to do a mychart or televisit.   She was told to take mucinex but can't remember which one and I don't see it in her Last AVS with Wyn Quaker.   She is not taking a daily antihistamine or flonase as mentioned in patient instructions. I re-instructed her on that.   Patient states she is still having lots of mucus and sometimes there are streaks or blood in her mucus.   [name] please advise on mucinex regular d, or dm and red streaks in mucus.

## 2019-03-04 NOTE — Telephone Encounter (Signed)
Pt reports" neck pain"  for several months and " it is getting worse".  I instructed her to contact PCP . " he will just tell me to go see my cancer doctor". I told her I do not know what he would say and I  again told her to get an appt with her PCP.   She said she  had GI procedure ( aug) and was told " I may have yeast " and was treated for it.   She asked for a PET scan to check her " throat" . I asked if  the pain is in the  neck or the throat, "you know my neck".  I told her Julien Nordmann will order PET scan , but it may not be covered by insurance. She declined PET scan.   She asked if boyfriend can accompany her to ct scan . I told her no due to restrictive policy.

## 2019-03-04 NOTE — Telephone Encounter (Signed)
Called spoke with patient and discussed [name]'s recommendations as stated below Patient verbalized her understanding to take regular Mucinex, no D  Patient very hesitant to make appt of any type but was able to schedule appt with [name] on Friday 10.16.2020 @ 1115.  Patient declined sooner appt for symptoms but did voice understanding to call the office for sooner follow up if symptoms do not improve or they worsen.  Will forward to Dr. Halford Chessman as requested by [name] to ask for additional recommendations, if any.  Thank you.

## 2019-03-05 ENCOUNTER — Inpatient Hospital Stay: Payer: Medicare Other | Attending: Internal Medicine

## 2019-03-05 ENCOUNTER — Other Ambulatory Visit: Payer: Self-pay

## 2019-03-05 ENCOUNTER — Encounter (HOSPITAL_COMMUNITY): Payer: Self-pay

## 2019-03-05 ENCOUNTER — Ambulatory Visit (HOSPITAL_COMMUNITY)
Admission: RE | Admit: 2019-03-05 | Discharge: 2019-03-05 | Disposition: A | Discharge status: Home / Self Care | Payer: Medicare Other | Source: Ambulatory Visit | Attending: Internal Medicine | Admitting: Internal Medicine

## 2019-03-05 DIAGNOSIS — Z923 Personal history of irradiation: Secondary | ICD-10-CM | POA: Insufficient documentation

## 2019-03-05 DIAGNOSIS — Z9221 Personal history of antineoplastic chemotherapy: Secondary | ICD-10-CM | POA: Diagnosis not present

## 2019-03-05 DIAGNOSIS — C3491 Malignant neoplasm of unspecified part of right bronchus or lung: Secondary | ICD-10-CM

## 2019-03-05 DIAGNOSIS — Z85118 Personal history of other malignant neoplasm of bronchus and lung: Secondary | ICD-10-CM | POA: Diagnosis not present

## 2019-03-05 DIAGNOSIS — C3490 Malignant neoplasm of unspecified part of unspecified bronchus or lung: Secondary | ICD-10-CM | POA: Diagnosis not present

## 2019-03-05 LAB — CMP (CANCER CENTER ONLY)
ALT: 13 U/L (ref 0–44)
AST: 18 U/L (ref 15–41)
Albumin: 4.3 g/dL (ref 3.5–5.0)
Alkaline Phosphatase: 61 U/L (ref 38–126)
Anion gap: 8 (ref 5–15)
BUN: 10 mg/dL (ref 8–23)
CO2: 26 mmol/L (ref 22–32)
Calcium: 9.5 mg/dL (ref 8.9–10.3)
Chloride: 106 mmol/L (ref 98–111)
Creatinine: 0.66 mg/dL (ref 0.44–1.00)
GFR, Est AFR Am: >60 mL/min (ref >60)
GFR, Estimated: >60 mL/min (ref >60)
Glucose, Bld: 94 mg/dL (ref 70–99)
Potassium: 4.2 mmol/L (ref 3.5–5.1)
Sodium: 140 mmol/L (ref 135–145)
Total Bilirubin: 0.6 mg/dL (ref 0.3–1.2)
Total Protein: 6.6 g/dL (ref 6.5–8.1)

## 2019-03-05 LAB — CBC WITH DIFFERENTIAL (CANCER CENTER ONLY)
Abs Immature Granulocytes: 0.01 10*3/uL (ref 0.00–0.07)
Basophils Absolute: 0 10*3/uL (ref 0.0–0.1)
Basophils Relative: 0 %
Eosinophils Absolute: 0.1 10*3/uL (ref 0.0–0.5)
Eosinophils Relative: 1 %
HCT: 41.7 % (ref 36.0–46.0)
Hemoglobin: 14 g/dL (ref 12.0–15.0)
Immature Granulocytes: 0 %
Lymphocytes Relative: 13 %
Lymphs Abs: 0.6 10*3/uL — ABNORMAL LOW (ref 0.7–4.0)
MCH: 31.3 pg (ref 26.0–34.0)
MCHC: 33.6 g/dL (ref 30.0–36.0)
MCV: 93.1 fL (ref 80.0–100.0)
Monocytes Absolute: 0.4 10*3/uL (ref 0.1–1.0)
Monocytes Relative: 8 %
Neutro Abs: 3.5 10*3/uL (ref 1.7–7.7)
Neutrophils Relative %: 78 %
Platelet Count: 167 10*3/uL (ref 150–400)
RBC: 4.48 MIL/uL (ref 3.87–5.11)
RDW: 13.2 % (ref 11.5–15.5)
WBC Count: 4.5 10*3/uL (ref 4.0–10.5)
nRBC: 0 % (ref 0.0–0.2)

## 2019-03-05 MED ORDER — SODIUM CHLORIDE (PF) 0.9 % IJ SOLN
INTRAMUSCULAR | Status: AC
  Filled 2019-03-05: qty 50

## 2019-03-05 MED ORDER — IOHEXOL 300 MG/ML  SOLN
75.0000 mL | Freq: Once | INTRAMUSCULAR | Status: AC | PRN
  Administered 2019-03-05: 14:00:00 75 mL via INTRAVENOUS

## 2019-03-05 NOTE — Telephone Encounter (Signed)
No additional recommendations at this time.  Will review at [facility] on 03/19/19.

## 2019-03-08 ENCOUNTER — Other Ambulatory Visit: Payer: Self-pay

## 2019-03-08 ENCOUNTER — Inpatient Hospital Stay (HOSPITAL_BASED_OUTPATIENT_CLINIC_OR_DEPARTMENT_OTHER): Payer: Medicare Other | Admitting: Internal Medicine

## 2019-03-08 ENCOUNTER — Encounter: Payer: Self-pay | Admitting: Internal Medicine

## 2019-03-08 ENCOUNTER — Telehealth: Payer: Self-pay | Admitting: Internal Medicine

## 2019-03-08 VITALS — BP 129/71 | HR 83 | Temp 98.7°F | Resp 18 | Ht 64.0 in | Wt 125.5 lb

## 2019-03-08 DIAGNOSIS — I10 Essential (primary) hypertension: Secondary | ICD-10-CM | POA: Diagnosis not present

## 2019-03-08 DIAGNOSIS — C3491 Malignant neoplasm of unspecified part of right bronchus or lung: Secondary | ICD-10-CM | POA: Diagnosis not present

## 2019-03-08 DIAGNOSIS — I208 Other forms of angina pectoris: Secondary | ICD-10-CM

## 2019-03-08 DIAGNOSIS — Z85118 Personal history of other malignant neoplasm of bronchus and lung: Secondary | ICD-10-CM | POA: Diagnosis not present

## 2019-03-08 DIAGNOSIS — Z923 Personal history of irradiation: Secondary | ICD-10-CM | POA: Diagnosis not present

## 2019-03-08 DIAGNOSIS — Z9221 Personal history of antineoplastic chemotherapy: Secondary | ICD-10-CM | POA: Diagnosis not present
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
## 2019-03-08 NOTE — Telephone Encounter (Signed)
Scheduled per 10/05 los, patient receive calender and after visit summary.

## 2019-03-09 ENCOUNTER — Telehealth: Payer: Self-pay | Admitting: Cardiology

## 2019-03-09 NOTE — Telephone Encounter (Signed)
Patient would like to know what significant change Dr. [name] found with her heart. Patient also stated she would would like to get a "sonogram". She wants to know why she was put to her Plavix to every other day.

## 2019-03-09 NOTE — Telephone Encounter (Signed)
Spoke with Michele [name], when she was seen in July dr harding told her he saw a significant change in her heart but that he was not concerned about it. No mention in ov note of change. Aware plavix was changed to qod because of bruising. She is concerned about blockage only taking it qod and reports the bruising has no improved. She reports her heart beating fast while at rest and with activity and also occ sharpe pain in her heart that wakes her from sleep. She wants to know if she can get an echocardiogram and what significant change dr harding saw. Will forward to dr harding to review and advise.

## 2019-03-10 ENCOUNTER — Encounter: Payer: Self-pay | Admitting: Internal Medicine

## 2019-03-10 NOTE — Telephone Encounter (Signed)
I did not mention anything about a significant change.  There was some nonspecific abnormalities noted on EKG and that is all that was.  There was nothing abnormal about her heart.  I do not think she needs an echocardiogram.  She always has a fast heart rate spells.  I do want her to stay on Plavix every other day that is peripherally fine.  The half-life of a platelet is 6 to 7 days and therefore every other day Plavix is fine as far out as she is from having her MI.  Please try to reassure her that we do not need to do any more studies.  It would be fine if she wants to go back to taking Plavix every day but I do not think it will make a difference.  Glenetta Hew, MD

## 2019-03-10 NOTE — Telephone Encounter (Signed)
Left message for pt to call.

## 2019-03-10 NOTE — Telephone Encounter (Signed)
Follow up:    Patient returning a call back. Please call patient.

## 2019-03-10 NOTE — Telephone Encounter (Signed)
Spoke with pt, aware of dr harding's recommendations. 

## 2019-03-11 DIAGNOSIS — S92315D Nondisplaced fracture of first metatarsal bone, left foot, subsequent encounter for fracture with routine healing: Secondary | ICD-10-CM | POA: Diagnosis not present

## 2019-03-11 DIAGNOSIS — M79672 Pain in left foot: Secondary | ICD-10-CM | POA: Diagnosis not present

## 2019-03-12 ENCOUNTER — Other Ambulatory Visit: Payer: Self-pay

## 2019-03-12 DIAGNOSIS — Z20822 Contact with and (suspected) exposure to covid-19: Secondary | ICD-10-CM

## 2019-03-13 LAB — NOVEL CORONAVIRUS, NAA: SARS-CoV-2, NAA: NOT DETECTED

## 2019-03-17 ENCOUNTER — Telehealth: Payer: Self-pay

## 2019-03-17 ENCOUNTER — Other Ambulatory Visit: Payer: Self-pay

## 2019-03-17 ENCOUNTER — Encounter: Payer: Self-pay | Admitting: Pulmonary Disease

## 2019-03-17 ENCOUNTER — Ambulatory Visit (INDEPENDENT_AMBULATORY_CARE_PROVIDER_SITE_OTHER): Payer: Medicare Other | Admitting: Pulmonary Disease

## 2019-03-17 DIAGNOSIS — J439 Emphysema, unspecified: Secondary | ICD-10-CM

## 2019-03-17 DIAGNOSIS — Z720 Tobacco use: Secondary | ICD-10-CM | POA: Diagnosis not present

## 2019-03-17 DIAGNOSIS — J479 Bronchiectasis, uncomplicated: Secondary | ICD-10-CM | POA: Diagnosis not present

## 2019-03-17 NOTE — Progress Notes (Signed)
Virtual Visit via Video Note  I connected with Michele [name] on 03/17/19 at 11:30 AM EDT by a video enabled telemedicine application and verified that I am speaking with the correct person using two identifiers.  Location: Patient: Home Provider: Office - [facility] - [address], [address], [place], [place] [address]  I discussed the limitations of evaluation and management by telemedicine and the availability of in person appointments. The patient expressed understanding and agreed to proceed. I also discussed with the patient that there may be a patient responsible charge related to this service. The patient expressed understanding and agreed to proceed.  Patient consented to consult via telephone: Yes People present and their role in pt care: Pt   History of Present Illness:  63 year old female former smoker followed in our office for COPD/emphysema, bronchiectasis,she has a history of SCLC and dysphagia from radiation.  Smoker/ Smoking History: Former smoker.  Quit March/2019.  60-pack-year smoking history. Maintenance: Spiriva HandiHaler, Arnuity Ellipta Pt of: Dr. Halford Chessman  Chief complaint:    63 year old female former smoker followed in our office for COPD/emphysema as well as bronchiectasis.  She is a patient of Dr. Halford Chessman.  She is maintained on Spiriva HandiHaler and Arnuity Ellipta.  Patient has recent negative COVID test on 03/12/2019.  Patient was scheduled as a video visit as patient has had acute worsened symptoms over the past 2 to 3 weeks with increased body aches, fatigue, chills,.  She has a productive cough with white, sinus congestion sore throat and she reports that her lungs hurt.  She thinks that she may have pneumonia.  Patient reports that for 1 month or more she has been having intermittent lung pain.  She reports that she has body aches all the time.  She does not have any sort of worsening fatigue.  She continues to have a productive cough with thick white  sputum.  She does have known bronchiectasis but she stopped using her flutter valve back in April/2020 she is unsure why.  She just reports that she feels like she has too many other medications to take care of and she forgot to do it.  She reports no recent antibiotics.  She does not want to start antibiotics today until she has a chest x-ray because she reports she gets recurrent yeast infections.  She continues to be maintained on Arnuity Ellipta as well as Spiriva HandiHaler.  Patient also reporting today that she is having difficulty using her nebulizer.  She believes that she was using levalbuterol nebulizer solution and she does not feel like her nebulizer is working well.  She reports that she believes she got this 6 months ago she does not remember where.  She would like our assistance in helping with this.  Observations/Objective:  03/17/2019 - temporal temp - 98  Pulmonary tests:  PFT 12/23/11 >> FEV1 2.13 (85%), FEV1% 59, TLC 5.98 (112%), DLCO 60% Spirometry 11/19/17 >> FEV1 2.58 (100%), FEV1% 76 FeNO 11/19/17 >> 78 63 year old female former smoker presenting for office today to establish with pulmonary in the outpatient setting after being consulted with our practice on the inpatient setting on 03/08/2019 following a COPD exacerbation as well as acute on chronic respiratory failure requiring intubation as well as persistent hypercarbia and discharged on BiPAP.  Chest imaging:  CT chest 08/24/16 >> CAD, mod/severe emphysema CT chest 08/29/17 >> severe emphysema CT chest 03/02/18 >> severe centrilobular and paraseptal emphysema, 4 mm nodule RUL partially calcified CT angio chest 04/27/18 >>  atherosclerosis, small/mod hiatal hernia, dilation of esophagus with mucosal thickening, severe emphysema with large bulla, bronchiectasis 06/12/2018-chest x-ray COPD and chronic interstitial lung disease, mild infiltrate noted anteriorly on the lateral view only, follow-up chest x-ray in 4 weeks following  trial of antibiotic therapy 07/10/2018-chest x-ray- COPD without acute abnormality, previously seen density has resolved in the interval  Cardiac tests:  Echo 08/30/17 >> EF 60 to 65%, grade 1 DD, mild MR  Assessment and Plan:  Bronchiectasis without complication ([place]) Likely bronchiectatic exacerbation Patient has been noncompliant with flutter valve for the last 6 months Patient declines empiric antibiotics today  Plan: Resume using flutter valve Continue using Mucinex Restart using a daily antihistamine Continue Flonase We need to get the patient scheduled tomorrow for a flutter valve Patient to come to our office tomorrow for a chest x-ray as well as a physical in person evaluation  COPD (chronic obstructive pulmonary disease) ([place]) 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 -Patient is a former smoker -60-pack-year smoking history, husband still smokes 1-1/2 packs/day   Plan: Continue Arnuity Ellipta Continue Spiriva HandiHaler Present to our office tomorrow for a walk in office Patient needs to complete pulmonary function testing Likely will need walk in office tomorrow     Tobacco abuse, stopped March / 2019 Plan: Continue to not smoke Continue to avoid being around others who are smoking Recent CT in October/2020 stable by Dr. Earlie Server   Follow Up Instructions:  Return in about 1 day (around 03/18/2019), or if symptoms worsen or fail to improve, for Follow up with Wyn Quaker FNP-C, With Chest Xray.    I discussed the assessment and treatment plan with the patient. The patient was provided an opportunity to ask questions and all were answered. The patient agreed with the plan and demonstrated an understanding of the instructions.   The patient was advised to call back or seek an in-person evaluation if the symptoms worsen or if the condition fails to improve  as anticipated.  I provided 28 minutes of non-face-to-face time during this encounter.   Lauraine Rinne, NP

## 2019-03-17 NOTE — Assessment & Plan Note (Signed)
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 -Patient is a former smoker -60-pack-year smoking history, husband still smokes 1-1/2 packs/day   Plan: Continue Arnuity Ellipta Continue Spiriva HandiHaler Present to our office tomorrow for a walk in office Patient needs to complete pulmonary function testing Likely will need walk in office tomorrow

## 2019-03-17 NOTE — Patient Instructions (Signed)
You were seen today by Lauraine Rinne, NP  for:   1. Pulmonary emphysema, unspecified emphysema type ([place])  2. Bronchiectasis without complication ([place])  - DG Chest 2 View; Future  Bronchiectasis: This is the medical term which indicates that you have damage, dilated airways making you more susceptible to respiratory infection. Use a flutter valve 10 breaths twice a day or 4 to 5 breaths 4-5 times a day to help clear mucus out Let us know if you have cough with change in mucus color or fevers or chills.  At that point you would need an antibiotic. Maintain a healthy nutritious diet, eating whole foods Take your medications as prescribed   3. Tobacco abuse, stopped March / 2019  Please present to our office on 03/18/2019 with a chest x-ray.  As well as an in person evaluation and physical assessment.  Please resume using your flutter valve  Keep using all of your other inhalers as prescribed  We recommend today:  Orders Placed This Encounter  Procedures  . DG Chest 2 View    Standing Status:   Future    Standing Expiration Date:   05/16/2020    Order Specific Question:   Reason for Exam (SYMPTOM  OR DIAGNOSIS REQUIRED)    Answer:   cough / lung pain    Order Specific Question:   Preferred imaging location?    Answer:   Internal    Order Specific Question:   Radiology Contrast Protocol - do NOT remove file path    Answer:   [email or web address]   Orders Placed This Encounter  Procedures  . DG Chest 2 View   No orders of the defined types were placed in this encounter.   Follow Up:    Return in about 1 day (around 03/18/2019), or if symptoms worsen or fail to improve, for Follow up with Wyn Quaker FNP-C, With Chest Xray.   Please do your part to reduce the spread of COVID-19:      Reduce your risk of any infection  and COVID19 by using the similar precautions used for avoiding the common cold or flu:  [name] Wash your hands often with  soap and warm water for at least 20 seconds.  If soap and water are not readily available, use an alcohol-based hand sanitizer with at least 60% alcohol.  . If coughing or sneezing, cover your mouth and nose by coughing or sneezing into the elbow areas of your shirt or coat, into a tissue or into your sleeve (not your hands). [name] A MASK when in public  . Avoid shaking hands with others and consider head nods or verbal greetings only. . Avoid touching your eyes, nose, or mouth with unwashed hands.  . Avoid close contact with people who are sick. . Avoid places or events with large numbers of people in one location, like concerts or sporting events. . If you have some symptoms but not all symptoms, continue to monitor at home and seek medical attention if your symptoms worsen. . If you are having a medical emergency, call 911.   [place] / e-Visit: [email or web address]         [facility]: [phone]  [facility]: [phone]                   [facility]: [phone]     It is flu season:   >>> Best ways to protect  herself from the flu: Receive the yearly flu vaccine, practice good hand hygiene washing with soap and also using hand sanitizer when available, eat a nutritious meals, get adequate rest, hydrate appropriately   Please contact the office if your symptoms worsen or you have concerns that you are not improving.   Thank you for choosing [facility] for your healthcare, and for allowing [place] to partner with you on your healthcare journey. I am thankful to be able to provide care to you today.   Wyn Quaker FNP-C

## 2019-03-17 NOTE — Telephone Encounter (Signed)
Called and spoke to patient. Reviewed with patient symptoms. Patient stated she is having increased shortness of breath, productive cough -white, sinus congestion with sore throat, and "lungs hurt."  Patient stated she really wants to come in for visit because she thinks she has pneumonia. Patient was tested for covid on 03/12/2019 and was negative.  Scheduled patient with Wyn Quaker, NP for MyChart Video visit for today and did not cancel tomorrow's OV since patient is insistent she will need to come in.  Advised patient that during the video visit provider will let her know if she should come in for an in person visit or not. Nothing further is needed at this time.
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
## 2019-03-17 NOTE — Assessment & Plan Note (Signed)
Likely bronchiectatic exacerbation Patient has been noncompliant with flutter valve for the last 6 months Patient declines empiric antibiotics today  Plan: Resume using flutter valve Continue using Mucinex Restart using a daily antihistamine Continue Flonase We need to get the patient scheduled tomorrow for a flutter valve Patient to come to our office tomorrow for a chest x-ray as well as a physical in person evaluation

## 2019-03-17 NOTE — Assessment & Plan Note (Signed)
Plan: Continue to not smoke Continue to avoid being around others who are smoking Recent CT in October/2020 stable by Dr. Earlie Server

## 2019-03-17 NOTE — Telephone Encounter (Signed)
When calling pt for COVID Screening for appt w/ BM 10/14 @ 2, pt states she had congestion, runny nose, sore throat, pain in her head, increased SOB, rash & increased weakness.  Pt refused a Video visit or a televisit.  Let pt know I will put a triage message & nurse will call.

## 2019-03-18 ENCOUNTER — Other Ambulatory Visit: Payer: Self-pay

## 2019-03-18 ENCOUNTER — Ambulatory Visit (INDEPENDENT_AMBULATORY_CARE_PROVIDER_SITE_OTHER): Payer: Medicare Other | Admitting: Pulmonary Disease

## 2019-03-18 ENCOUNTER — Encounter: Payer: Self-pay | Admitting: Pulmonary Disease

## 2019-03-18 ENCOUNTER — Ambulatory Visit (INDEPENDENT_AMBULATORY_CARE_PROVIDER_SITE_OTHER): Payer: Medicare Other

## 2019-03-18 VITALS — BP 116/70 | HR 74 | Temp 97.4°F | Ht 64.0 in

## 2019-03-18 DIAGNOSIS — J439 Emphysema, unspecified: Secondary | ICD-10-CM | POA: Diagnosis not present

## 2019-03-18 DIAGNOSIS — I208 Other forms of angina pectoris: Secondary | ICD-10-CM | POA: Diagnosis not present

## 2019-03-18 DIAGNOSIS — E7800 Pure hypercholesterolemia, unspecified: Secondary | ICD-10-CM | POA: Diagnosis not present

## 2019-03-18 DIAGNOSIS — H6123 Impacted cerumen, bilateral: Secondary | ICD-10-CM | POA: Diagnosis not present

## 2019-03-18 DIAGNOSIS — Z720 Tobacco use: Secondary | ICD-10-CM | POA: Diagnosis not present

## 2019-03-18 DIAGNOSIS — R7303 Prediabetes: Secondary | ICD-10-CM | POA: Diagnosis not present

## 2019-03-18 DIAGNOSIS — C3491 Malignant neoplasm of unspecified part of right bronchus or lung: Secondary | ICD-10-CM | POA: Diagnosis not present

## 2019-03-18 DIAGNOSIS — Z79899 Other long term (current) drug therapy: Secondary | ICD-10-CM

## 2019-03-18 DIAGNOSIS — R05 Cough: Secondary | ICD-10-CM | POA: Diagnosis not present

## 2019-03-18 DIAGNOSIS — J479 Bronchiectasis, uncomplicated: Secondary | ICD-10-CM

## 2019-03-18 DIAGNOSIS — K219 Gastro-esophageal reflux disease without esophagitis: Secondary | ICD-10-CM | POA: Diagnosis not present

## 2019-03-18 DIAGNOSIS — Z0000 Encounter for general adult medical examination without abnormal findings: Secondary | ICD-10-CM | POA: Diagnosis not present

## 2019-03-18 DIAGNOSIS — R059 Cough, unspecified: Secondary | ICD-10-CM

## 2019-03-18 MED ORDER — ARNUITY ELLIPTA 100 MCG/ACT IN AEPB: 1.0000 | INHALATION_SPRAY | Freq: Every day | RESPIRATORY_TRACT | 0 refills | Status: DC

## 2019-03-18 NOTE — Assessment & Plan Note (Addendum)
Plan: Patient to contact her office if cough is not improving despite resuming flutter valve use Could consider antibiotic therapy in the future Patient declined antibiotic therapy today If symptoms or not improving or patient starts coughing up blood she needs to contact our office

## 2019-03-18 NOTE — Assessment & Plan Note (Signed)
Persistent acid reflux symptoms today in office visit Patient reports she stopped her PPI over a month ago under recommendations from her insurance company and support through primary care Symptoms improve when taking sips of water or using Tums  Plan: Complete primary care follow-up today as planned Discussed symptoms with primary care Consider referral to gastroenterology Patient likely needs to resume PPI therapy

## 2019-03-18 NOTE — Patient Instructions (Addendum)
You were seen today by Lauraine Rinne, NP  for:   1. Bronchiectasis without complication (HCC)  Bronchiectasis: This is the medical term which indicates that you have damage, dilated airways making you more susceptible to respiratory infection. Use a flutter valve 10 breaths twice a day or 4 to 5 breaths 4-5 times a day to help clear mucus out Let us know if you have cough with change in mucus color or fevers or chills.  At that point you would need an antibiotic. Maintain a healthy nutritious diet, eating whole foods Take your medications as prescribed   Contact our office if your symptoms do not improve   Take Mucinex 600mg  daily for next 7 days and then as needed for congestion   Hydrate well   2. Pulmonary emphysema, unspecified emphysema type ([place])  - Ambulatory Referral for DME  Spiriva Handihaler 35mcg capsule >>> take 2 puffs using the inhaler and 1 capsule daily  >>> rinse mouth out after use  >>> take daily no matter what  >>> this is not a rescue inhaler   Continue Arnuity Ellipta 100 >>> Take 1 puff daily in the morning right when you wake up >>>Rinse your mouth out after use >>>This is a daily maintenance inhaler, NOT a rescue inhaler >>>Contact our office if you are having difficulties affording or obtaining this medication >>>It is important for you to be able to take this daily and not miss any doses  Note your daily symptoms > remember "red flags" for COPD:   >>>Increase in cough >>>increase in sputum production >>>increase in shortness of breath or activity  intolerance.   If you notice these symptoms, please call the office to be seen.    3. Medication management   4. Tobacco abuse, stopped March / 2019  Continue to not smoke   5. Gastroesophageal reflux disease, unspecified whether esophagitis present  Talk to PCP today    We recommend today:  Orders Placed This Encounter  Procedures  . Ambulatory Referral for DME    Referral Priority:    Routine    Referral Type:   Durable Medical Equipment Purchase    Number of Visits Requested:   1   Orders Placed This Encounter  Procedures  . Ambulatory Referral for DME   No orders of the defined types were placed in this encounter.   Follow Up:    Return in about 2 months (around 05/18/2019), or if symptoms worsen or fail to improve, for Follow up with Dr. Halford Chessman, Follow up for PFT.   Please do your part to reduce the spread of COVID-19:      Reduce your risk of any infection  and COVID19 by using the similar precautions used for avoiding the common cold or flu:  [name] Wash your hands often with soap and warm water for at least 20 seconds.  If soap and water are not readily available, use an alcohol-based hand sanitizer with at least 60% alcohol.  . If coughing or sneezing, cover your mouth and nose by coughing or sneezing into the elbow areas of your shirt or coat, into a tissue or into your sleeve (not your hands). [name] A MASK when in public  . Avoid shaking hands with others and consider head nods or verbal greetings only. . Avoid touching your eyes, nose, or mouth with unwashed hands.  . Avoid close contact with people who are sick. . Avoid places or events with large numbers of people in one location,  like concerts or sporting events. . If you have some symptoms but not all symptoms, continue to monitor at home and seek medical attention if your symptoms worsen. . If you are having a medical emergency, call 911.   [place] / e-Visit: [email or web address]         [facility]: [facility]: [phone]                   [facility]: [phone]     It is flu season:   >>> Best ways to protect herself from the flu: Receive the yearly flu vaccine, practice good hand hygiene washing with soap and also using hand  sanitizer when available, eat a nutritious meals, get adequate rest, hydrate appropriately   Please contact the office if your symptoms worsen or you have concerns that you are not improving.   Thank you for choosing  [facility] for your healthcare, and for allowing [place] to partner with you on your healthcare journey. I am thankful to be able to provide care to you today.   Wyn Quaker FNP-C

## 2019-03-18 NOTE — Assessment & Plan Note (Signed)
Once again as suspected yesterday I believe patient likely is having a bronchiectasis exacerbation directly related to her noncompliance with her flutter valve for the last 6 months  Chest x-ray today is reassuring as not showing any sort of infiltrate.  Discussion: Offered antibiotic therapy to the patient today, she declined she would like to see how she does by resuming flutter valve use, hydrating as well as taking Mucinex.  She would like to see if this will help relieve her symptoms prior to having to take additional antibiotics.  Plan: Resume flutter valve use Take Mucinex 600 mg daily for least the next 7 days and then can use as needed, samples of Mucinex provided today Hydrate well

## 2019-03-18 NOTE — Progress Notes (Signed)
Reviewed and agree with assessment/plan.   Janely Gullickson, MD [facility] 05/29/2016, 12:24 PM Pager:  [phone]  

## 2019-03-18 NOTE — Progress Notes (Signed)
Discussed results with patient in office.  Nothing further is needed at this time.  Jaivon Vanbeek FNP  

## 2019-03-18 NOTE — Assessment & Plan Note (Signed)
Samples of Arnuity Ellipta provided today Samples of Mucinex provided today

## 2019-03-18 NOTE — Assessment & Plan Note (Signed)
Chest x-ray today shows severe emphysema Diminished breath sounds throughout entire exam  Plan: Continue Arnuity 100 Continue Spiriva HandiHaler Follow-up with our office in 8 weeks with a pulmonary function test

## 2019-03-18 NOTE — Assessment & Plan Note (Signed)
Plan: Continue not smoke 

## 2019-03-19 ENCOUNTER — Telehealth: Payer: Self-pay

## 2019-03-19 ENCOUNTER — Ambulatory Visit: Payer: Medicare Other | Admitting: Pulmonary Disease

## 2019-03-19 NOTE — Telephone Encounter (Signed)
Spoke with patient. She has been scheduled to see VS on 12/21 at 10am with a PFT at 9am. Patient is aware that someone will call her to get her scheduled for the covid test.   Nothing further needed at time of call.

## 2019-03-19 NOTE — Progress Notes (Signed)
Reviewed and agree with assessment/plan.   Shyloh Krinke, MD [facility] 05/29/2016, 12:24 PM Pager:  [phone]  

## 2019-03-19 NOTE — Telephone Encounter (Signed)
-----   Message from Valerie Salts, [place] sent at 03/18/2019  2:59 PM EDT ----- Regarding: 2 month f/u with PFT

## 2019-03-26 IMAGING — CT CT CHEST W/O CM
2 of 3 series · 15 of 36 positions shown, 18 images · non-contrast
Comparison: 08/24/2016

CLINICAL DATA: Small-cell lung cancer

EXAM:
CT CHEST WITHOUT CONTRAST
TECHNIQUE: Multidetector CT imaging of the chest was performed following the
standard protocol without IV contrast.

[Series 2: thorax · axial · 0.66mm/px · z∈[-378,-148]mm · 12 of 136 slices shown, 15 images]
[im 11/136  mediastinal]
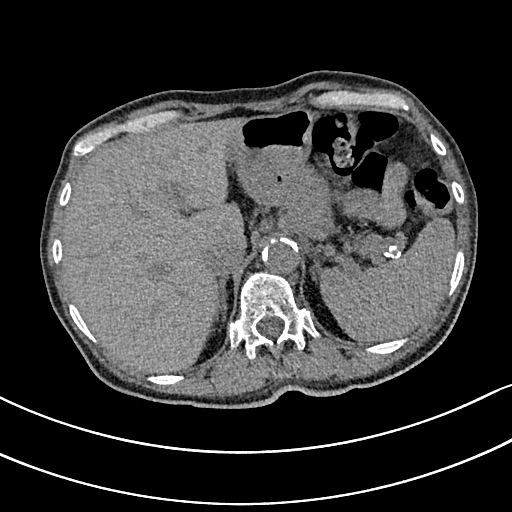
[im 11/136  lung]
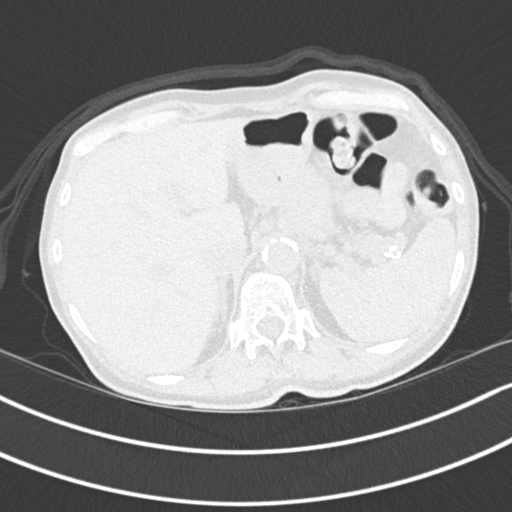
[im 21/136  lung]
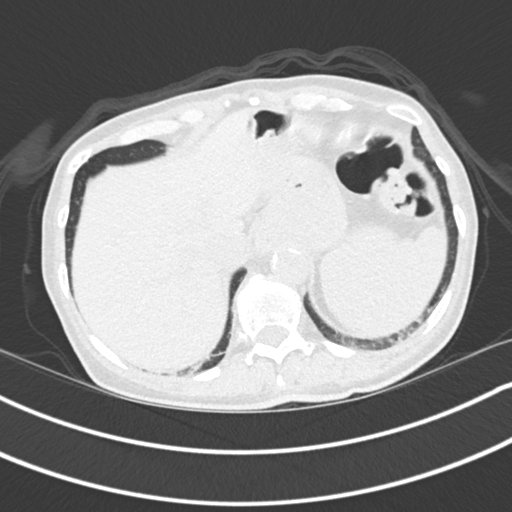
[im 31/136  lung]
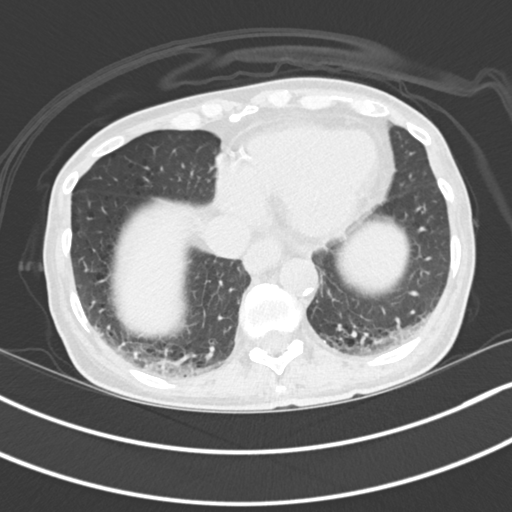
[im 41/136  lung]
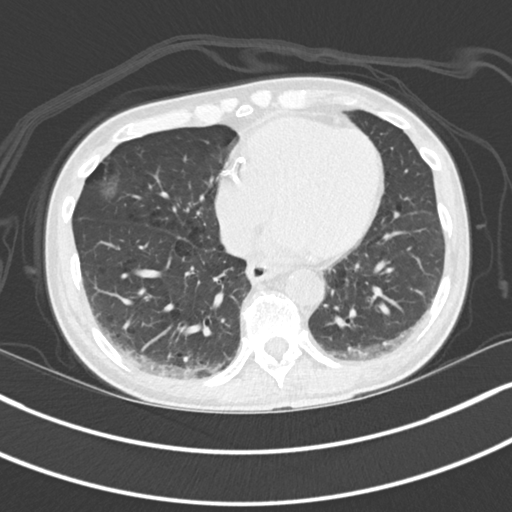
[im 51/136  mediastinal]
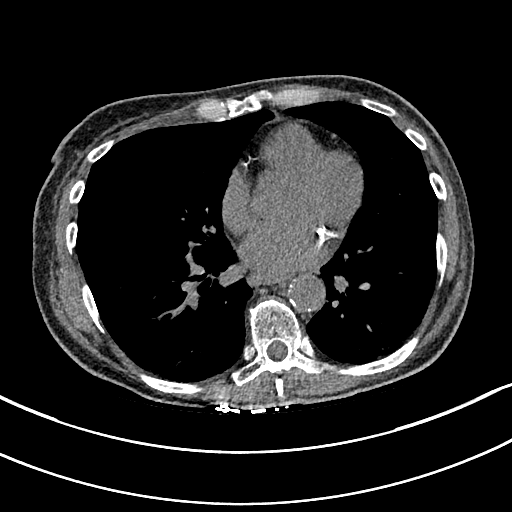
[im 51/136  lung]
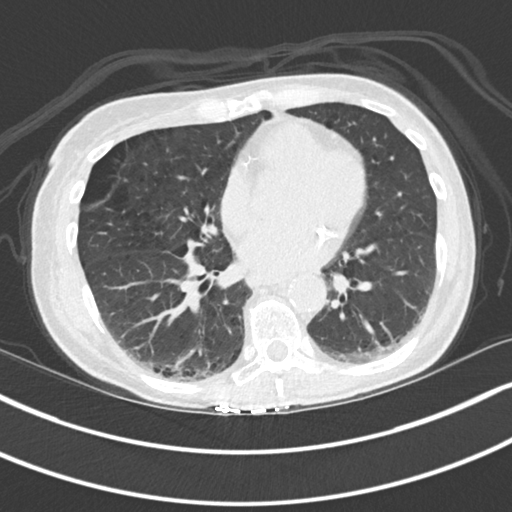
[im 61/136  lung]
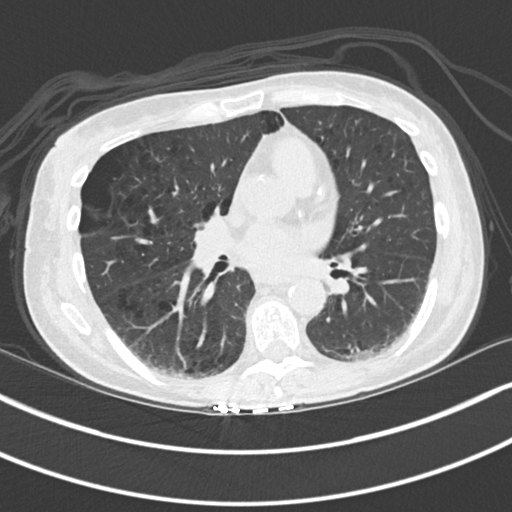
[im 76/136  lung]
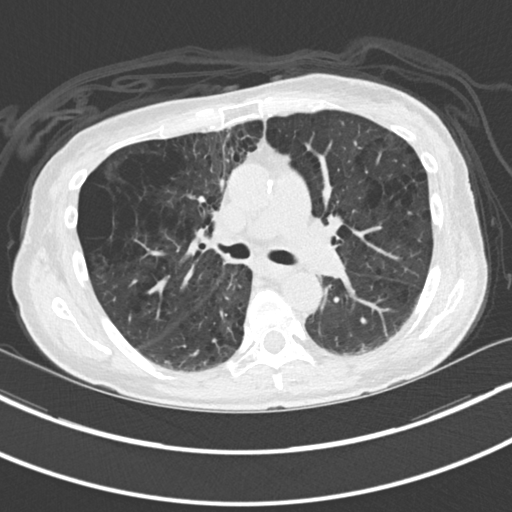
[im 86/136  lung]
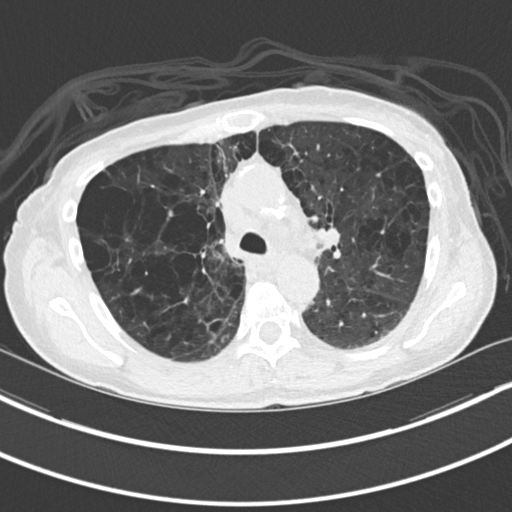
[im 96/136  mediastinal]
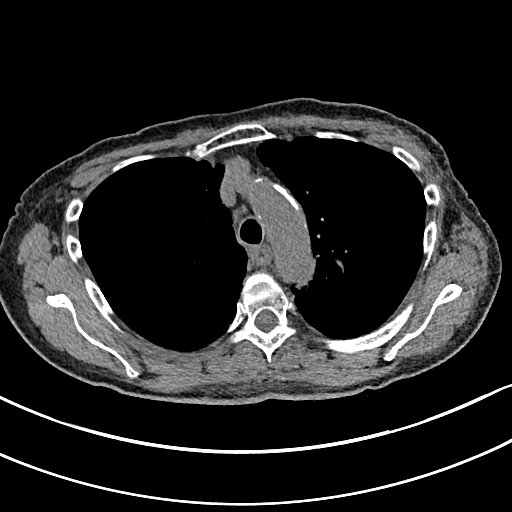
[im 96/136  lung]
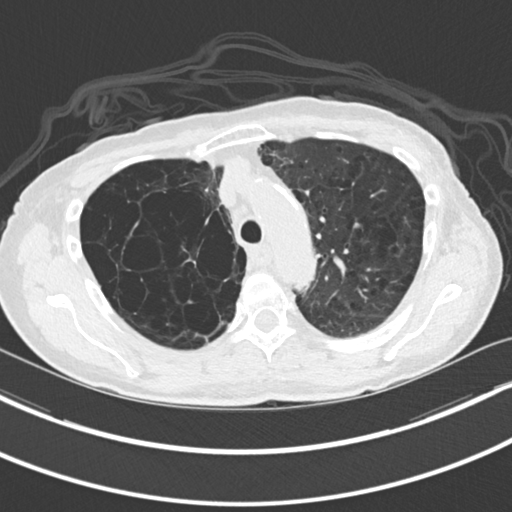
[im 106/136  lung]
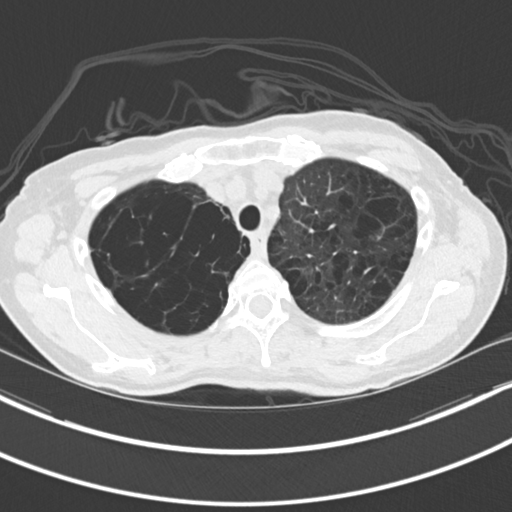
[im 116/136  lung]
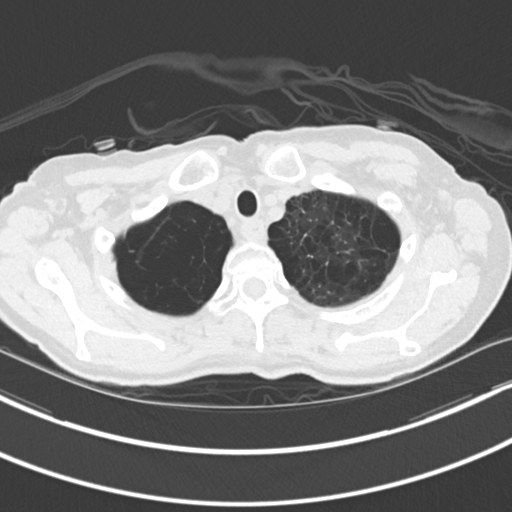
[im 126/136  lung]
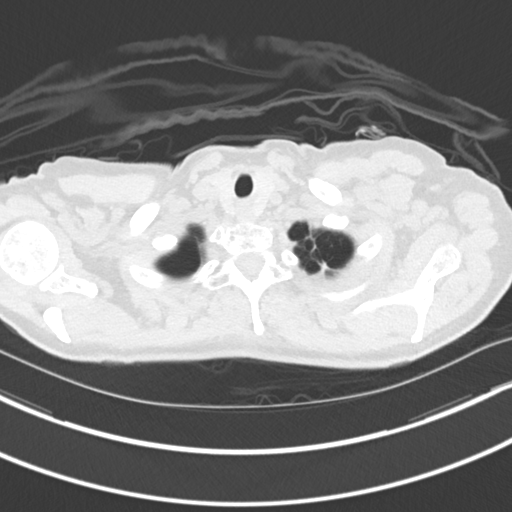

[Series 5: coronal · coronal · 0.57mm/px · 3 of 111 slices shown]
[im 23/111  lung]
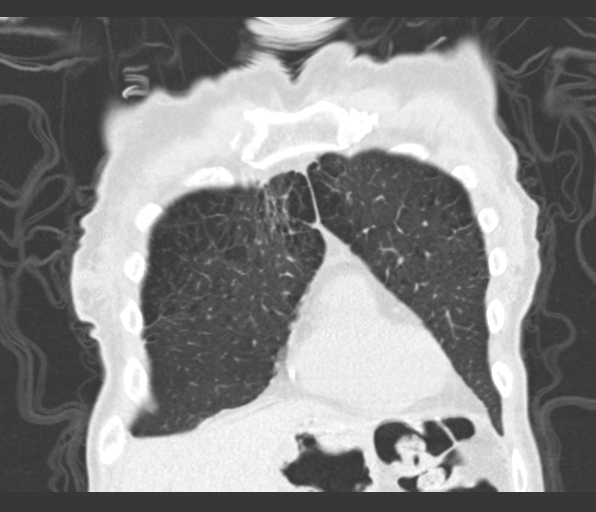
[im 45/111  lung]
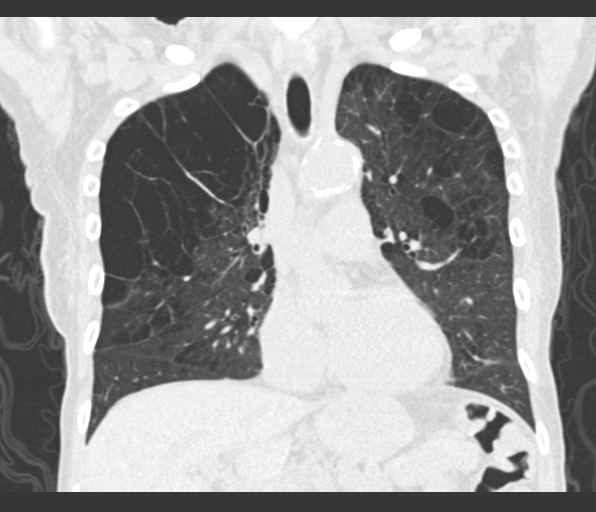
[im 67/111  lung]
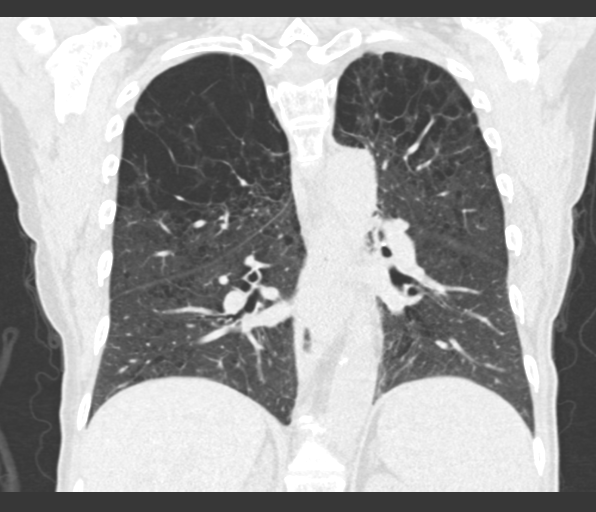

[15 of 36 positions shown; findings below may reference images not displayed]

FINDINGS: Cardiovascular: The heart size is normal. No pericardial effusion.
Coronary artery calcification is noted. Atherosclerotic
calcification is noted in the wall of the thoracic aorta.

Mediastinum/Nodes: 9 mm short axis right paratracheal lymph node
measured on the previous study is stable at 9 mm today. Precarinal
lymph node stable a 10 mm short axis. No evidence for gross hilar
lymphadenopathy although assessment is limited by the lack of
intravenous contrast on today's study. Small hiatal hernia.
Esophagus unremarkable. There is no axillary lymphadenopathy.

Lungs/Pleura: Emphysema with bullous change noted upper lobes, right
greater than left. 4 mm right middle lobe nodule seen image 79
series 7 today is stable in the interval. Compressive atelectasis in
the lower lungs bilaterally. No focal airspace consolidation. No
pulmonary edema or pleural effusion.

Upper Abdomen: 9 mm saccular aneurysm distal splenic artery is
stable.

Musculoskeletal: Bone windows reveal no worrisome lytic or sclerotic
osseous lesions.
IMPRESSION: 1. Stable exam.  No new or progressive findings.
2. Moderate to advanced emphysema.

Aortic Atherosclerois ([id number]-170.0)

## 2019-03-29 ENCOUNTER — Telehealth: Payer: Self-pay | Admitting: Cardiology

## 2019-03-29 NOTE — Telephone Encounter (Signed)
Pt stated her heart felt like it was beating out of her chest. States that the past 3 days she has taken her BP, her HR has said "irregular" on BP machine. Asked pt if she was having any other symptoms, states she was SOB. Pt stated not currently SOB. Asked the pt if she was having chest pain, denied. Last BP 116/69. Told pt that she should be seen, heart could be out of rhythm. Pt agreed. Appt was made tomorrow 9:45am with Jory Sims. Advised pt that if she became SOB and did not subside or had unrelieved chest pain to call 911.

## 2019-03-29 NOTE — Telephone Encounter (Signed)
New message   Patient c/o Palpitations:  High priority if patient c/o lightheadedness, shortness of breath, or chest pain  1) How long have you had palpitations/irregular HR/ Afib? Are you having the symptoms now? 2 to 3 weeks, yes   2) Are you currently experiencing lightheadedness, SOB or CP? Chest pain, lightheaded   3) Do you have a history of afib (atrial fibrillation) or irregular heart rhythm? Yes   4) Have you checked your BP or HR? (document readings if available): 116/69   5) Are you experiencing any other symptoms?rapid h/r
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
## 2019-03-30 ENCOUNTER — Encounter: Payer: Self-pay | Admitting: Adult Health

## 2019-03-30 ENCOUNTER — Other Ambulatory Visit: Payer: Self-pay

## 2019-03-30 ENCOUNTER — Ambulatory Visit (INDEPENDENT_AMBULATORY_CARE_PROVIDER_SITE_OTHER): Payer: Medicare Other | Admitting: Adult Health

## 2019-03-30 VITALS — BP 102/64 | HR 83 | Ht 64.0 in | Wt 127.0 lb

## 2019-03-30 DIAGNOSIS — E559 Vitamin D deficiency, unspecified: Secondary | ICD-10-CM

## 2019-03-30 DIAGNOSIS — R002 Palpitations: Secondary | ICD-10-CM | POA: Diagnosis not present

## 2019-03-30 DIAGNOSIS — I208 Other forms of angina pectoris: Secondary | ICD-10-CM

## 2019-03-30 DIAGNOSIS — J439 Emphysema, unspecified: Secondary | ICD-10-CM

## 2019-03-30 DIAGNOSIS — F418 Other specified anxiety disorders: Secondary | ICD-10-CM

## 2019-03-30 DIAGNOSIS — I2510 Atherosclerotic heart disease of native coronary artery without angina pectoris: Secondary | ICD-10-CM

## 2019-03-30 DIAGNOSIS — Z79899 Other long term (current) drug therapy: Secondary | ICD-10-CM

## 2019-03-30 NOTE — Patient Instructions (Signed)
Medication Instructions:  Continue current medications  *If you need a refill on your cardiac medications before your next appointment, please call your pharmacy*  Lab Work: CBC, BMP, TSH, Vit D and Magnesium  If you have labs (blood work) drawn today and your tests are completely normal, you will receive your results only by: [name] MyChart Message (if you have MyChart) OR . A paper copy in the mail If you have any lab test that is abnormal or we need to change your treatment, we will call you to review the results.  Testing/Procedures: Your physician has requested that you have an echocardiogram. Echocardiography is a painless test that uses sound waves to create images of your heart. It provides your doctor with information about the size and shape of your heart and how well your heart's chambers and valves are working. This procedure takes approximately one hour. There are no restrictions for this procedure.  Your physician has recommended that you wear a Zio Patch monitor. Holter monitors are medical devices that record the heart's electrical activity. Doctors most often use these monitors to diagnose arrhythmias. Arrhythmias are problems with the speed or rhythm of the heartbeat. The monitor is a small, portable device. You can wear one while you do your normal daily activities. This is usually used to diagnose what is causing palpitations/syncope (passing out).    Follow-Up: At [facility], you and your health needs are our priority.  As part of our continuing mission to provide you with exceptional heart care, we have created designated Provider Care Teams.  These Care Teams include your primary Cardiologist (physician) and Advanced Practice Providers (APPs -  Physician Assistants and Nurse Practitioners) who all work together to provide you with the care you need, when you need it.  Your next appointment:   1 Month  The format for your next appointment:   In Person  Provider:    Glenetta Hew, MD

## 2019-03-31 LAB — CBC
Hematocrit: 44.4 % (ref 34.0–46.6)
Hemoglobin: 14.8 g/dL (ref 11.1–15.9)
MCH: 30.8 pg (ref 26.6–33.0)
MCHC: 33.3 g/dL (ref 31.5–35.7)
MCV: 93 fL (ref 79–97)
Platelets: 189 10*3/uL (ref 150–450)
RBC: 4.8 x10E6/uL (ref 3.77–5.28)
RDW: 12.7 % (ref 11.7–15.4)
WBC: 5.1 10*3/uL (ref 3.4–10.8)

## 2019-03-31 LAB — BASIC METABOLIC PANEL
BUN/Creatinine Ratio: 22 (ref 12–28)
BUN: 12 mg/dL (ref 8–27)
CO2: 27 mmol/L (ref 20–29)
Calcium: 10.1 mg/dL (ref 8.7–10.3)
Chloride: 103 mmol/L (ref 96–106)
Creatinine, Ser: 0.55 mg/dL — ABNORMAL LOW (ref 0.57–1.00)
GFR calc Af Amer: 114 mL/min/{1.73_m2} (ref >59)
GFR calc non Af Amer: 99 mL/min/{1.73_m2} (ref >59)
Glucose: 71 mg/dL (ref 65–99)
Potassium: 4.6 mmol/L (ref 3.5–5.2)
Sodium: 141 mmol/L (ref 134–144)

## 2019-03-31 LAB — MAGNESIUM: Magnesium: 2.2 mg/dL (ref 1.6–2.3)

## 2019-03-31 LAB — TSH: TSH: 1.31 u[IU]/mL (ref 0.450–4.500)

## 2019-03-31 LAB — VITAMIN D 25 HYDROXY (VIT D DEFICIENCY, FRACTURES): Vit D, 25-Hydroxy: 38.4 ng/mL (ref 30.0–100.0)

## 2019-04-01 ENCOUNTER — Ambulatory Visit (HOSPITAL_COMMUNITY): Payer: Medicare Other | Attending: Adult Health

## 2019-04-01 ENCOUNTER — Other Ambulatory Visit: Payer: Self-pay

## 2019-04-01 DIAGNOSIS — I252 Old myocardial infarction: Secondary | ICD-10-CM | POA: Insufficient documentation

## 2019-04-01 DIAGNOSIS — E785 Hyperlipidemia, unspecified: Secondary | ICD-10-CM | POA: Insufficient documentation

## 2019-04-01 DIAGNOSIS — Z87891 Personal history of nicotine dependence: Secondary | ICD-10-CM | POA: Diagnosis not present

## 2019-04-01 DIAGNOSIS — I2510 Atherosclerotic heart disease of native coronary artery without angina pectoris: Secondary | ICD-10-CM | POA: Insufficient documentation

## 2019-04-01 DIAGNOSIS — C3490 Malignant neoplasm of unspecified part of unspecified bronchus or lung: Secondary | ICD-10-CM | POA: Insufficient documentation

## 2019-04-01 DIAGNOSIS — I714 Abdominal aortic aneurysm, without rupture: Secondary | ICD-10-CM | POA: Diagnosis not present

## 2019-04-01 DIAGNOSIS — R002 Palpitations: Secondary | ICD-10-CM | POA: Diagnosis not present

## 2019-04-01 HISTORY — PX: TRANSTHORACIC ECHOCARDIOGRAM: SHX275

## 2019-04-05 ENCOUNTER — Telehealth: Payer: Self-pay | Admitting: Adult Health

## 2019-04-05 ENCOUNTER — Telehealth: Payer: Self-pay | Admitting: *Deleted

## 2019-04-05 NOTE — Telephone Encounter (Signed)
Patient would like to speak to someone about her Echo results.

## 2019-04-05 NOTE — Telephone Encounter (Signed)
[id number] 

## 2019-04-05 NOTE — Telephone Encounter (Signed)
Please call patient to schedule her long term monitor.

## 2019-04-05 NOTE — Telephone Encounter (Signed)
7 day ZIO XT long term holter monitor to be mailed to the patients home.  Instructions reviewed briefly with patient as they are included in the monitor kit.

## 2019-04-05 NOTE — Telephone Encounter (Signed)
Gave pt results of echo. Pt did not understand why her heart was stiffening and asked if she was in A.Fib. Explained to pt that the monitor she is going to be wearing will tell more about her heart rhythm. The pt was nervous about her results. She was reassured that her results say normal heart pumping function. Will route to provider for review.

## 2019-04-05 NOTE — Telephone Encounter (Signed)
Agree. Mild stiffening is expected for her age, with history of high BP.  She is very anxious about her health and needs reassurance each time I see her. Will await the monitor results.

## 2019-04-06 NOTE — Progress Notes (Signed)
error 

## 2019-04-07 ENCOUNTER — Other Ambulatory Visit: Payer: Self-pay | Admitting: Radiation Therapy

## 2019-04-08 ENCOUNTER — Other Ambulatory Visit: Payer: Self-pay

## 2019-04-08 ENCOUNTER — Ambulatory Visit
Admission: RE | Admit: 2019-04-08 | Discharge: 2019-04-08 | Disposition: A | Discharge status: Home / Self Care | Payer: Medicare Other | Source: Ambulatory Visit | Attending: Radiation Oncology | Admitting: Radiation Oncology

## 2019-04-08 DIAGNOSIS — C7949 Secondary malignant neoplasm of other parts of nervous system: Secondary | ICD-10-CM

## 2019-04-08 DIAGNOSIS — C7931 Secondary malignant neoplasm of brain: Secondary | ICD-10-CM

## 2019-04-09 ENCOUNTER — Other Ambulatory Visit: Payer: Self-pay | Admitting: Radiation Therapy

## 2019-04-09 ENCOUNTER — Telehealth: Payer: Self-pay | Admitting: Cardiology

## 2019-04-09 ENCOUNTER — Telehealth: Payer: Self-pay | Admitting: Radiation Therapy

## 2019-04-09 DIAGNOSIS — C7949 Secondary malignant neoplasm of other parts of nervous system: Secondary | ICD-10-CM

## 2019-04-09 DIAGNOSIS — M79672 Pain in left foot: Secondary | ICD-10-CM | POA: Diagnosis not present

## 2019-04-09 DIAGNOSIS — C7931 Secondary malignant neoplasm of brain: Secondary | ICD-10-CM

## 2019-04-09 NOTE — Telephone Encounter (Signed)
Follow-up:   Patient wanted to know when she would receive the monitor. She has another scan next Friday 11/13 and is unsure if she can wear that during the scan. Will also route question to the doctor

## 2019-04-09 NOTE — Telephone Encounter (Signed)
Patient is to have MRI of brain scan on 11/13- should she wear monitor during this or wait until after?   Will route to Markus Daft for recommendations.

## 2019-04-09 NOTE — Telephone Encounter (Signed)
[name] MRI was rescheduled due to not being able to obtain venous access for contrast. She will now be scanned at [facility] on their 3T scanner just in case the IV team needs to be called.  Michele [name] is aware of the new time and place as well as the change in her follow-up appointment with Dr. Isidore Moos.    Mont Dutton R.T.(R)(T) Radiation Special Procedures Navigator

## 2019-04-09 NOTE — Telephone Encounter (Signed)
New Message:  Patient was ordered a 7 day heart monitor. She wanted to know if she was to wear the monitor during her upcoming scan on 11/13, or if she is to take it off during that test.  Patient has not received the monitor yet and wanted to confirm details with the office. Please advise

## 2019-04-10 ENCOUNTER — Ambulatory Visit (INDEPENDENT_AMBULATORY_CARE_PROVIDER_SITE_OTHER): Payer: Medicare Other

## 2019-04-10 DIAGNOSIS — R002 Palpitations: Secondary | ICD-10-CM

## 2019-04-11 IMAGING — DX DG CHEST 2V
2 series · 2 of 2 positions shown · non-contrast
Comparison: CT chest 08/24/2016.  PA and lateral chest 10/14/2016.

CLINICAL DATA: Posterior chest pain and productive cough for 2
months.

EXAM:
CHEST  2 VIEW

[chest pa]
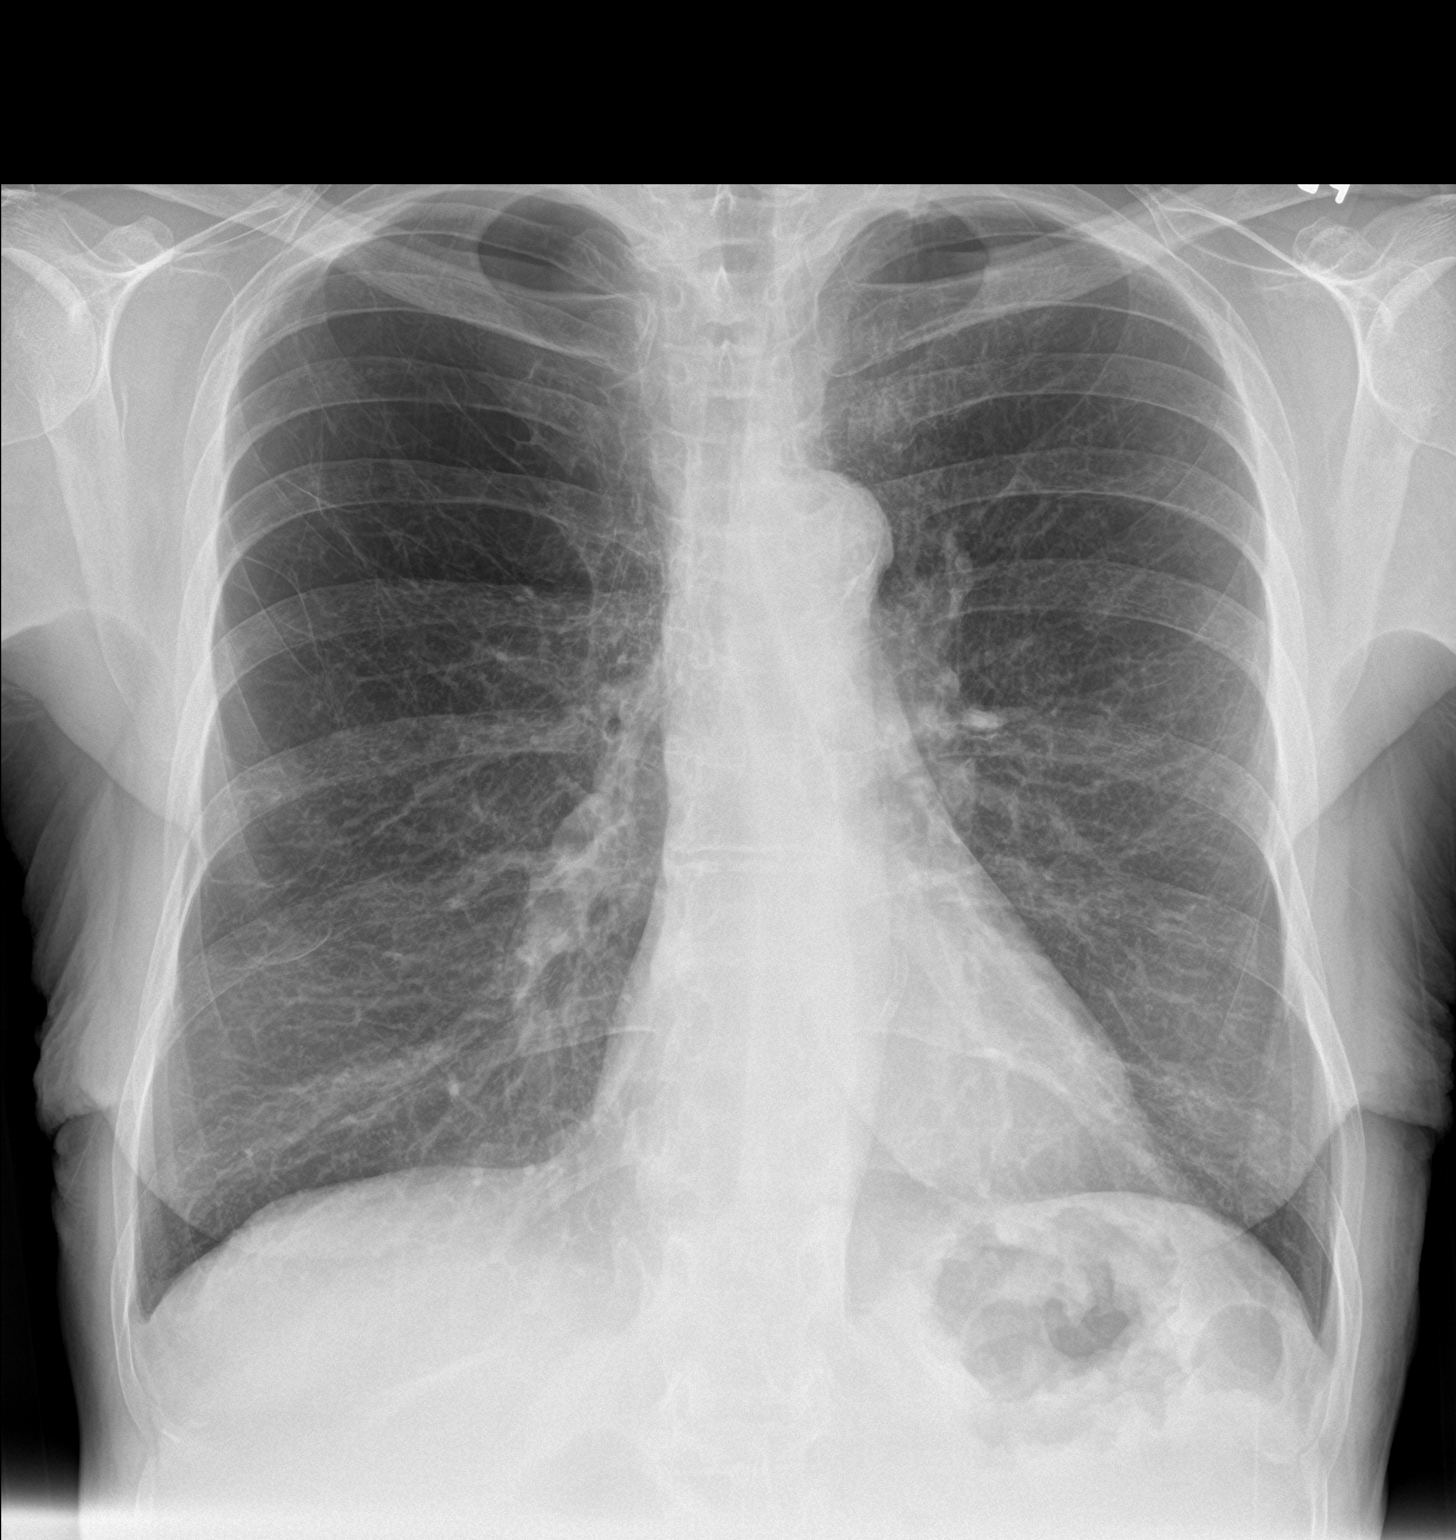

[chest lat]
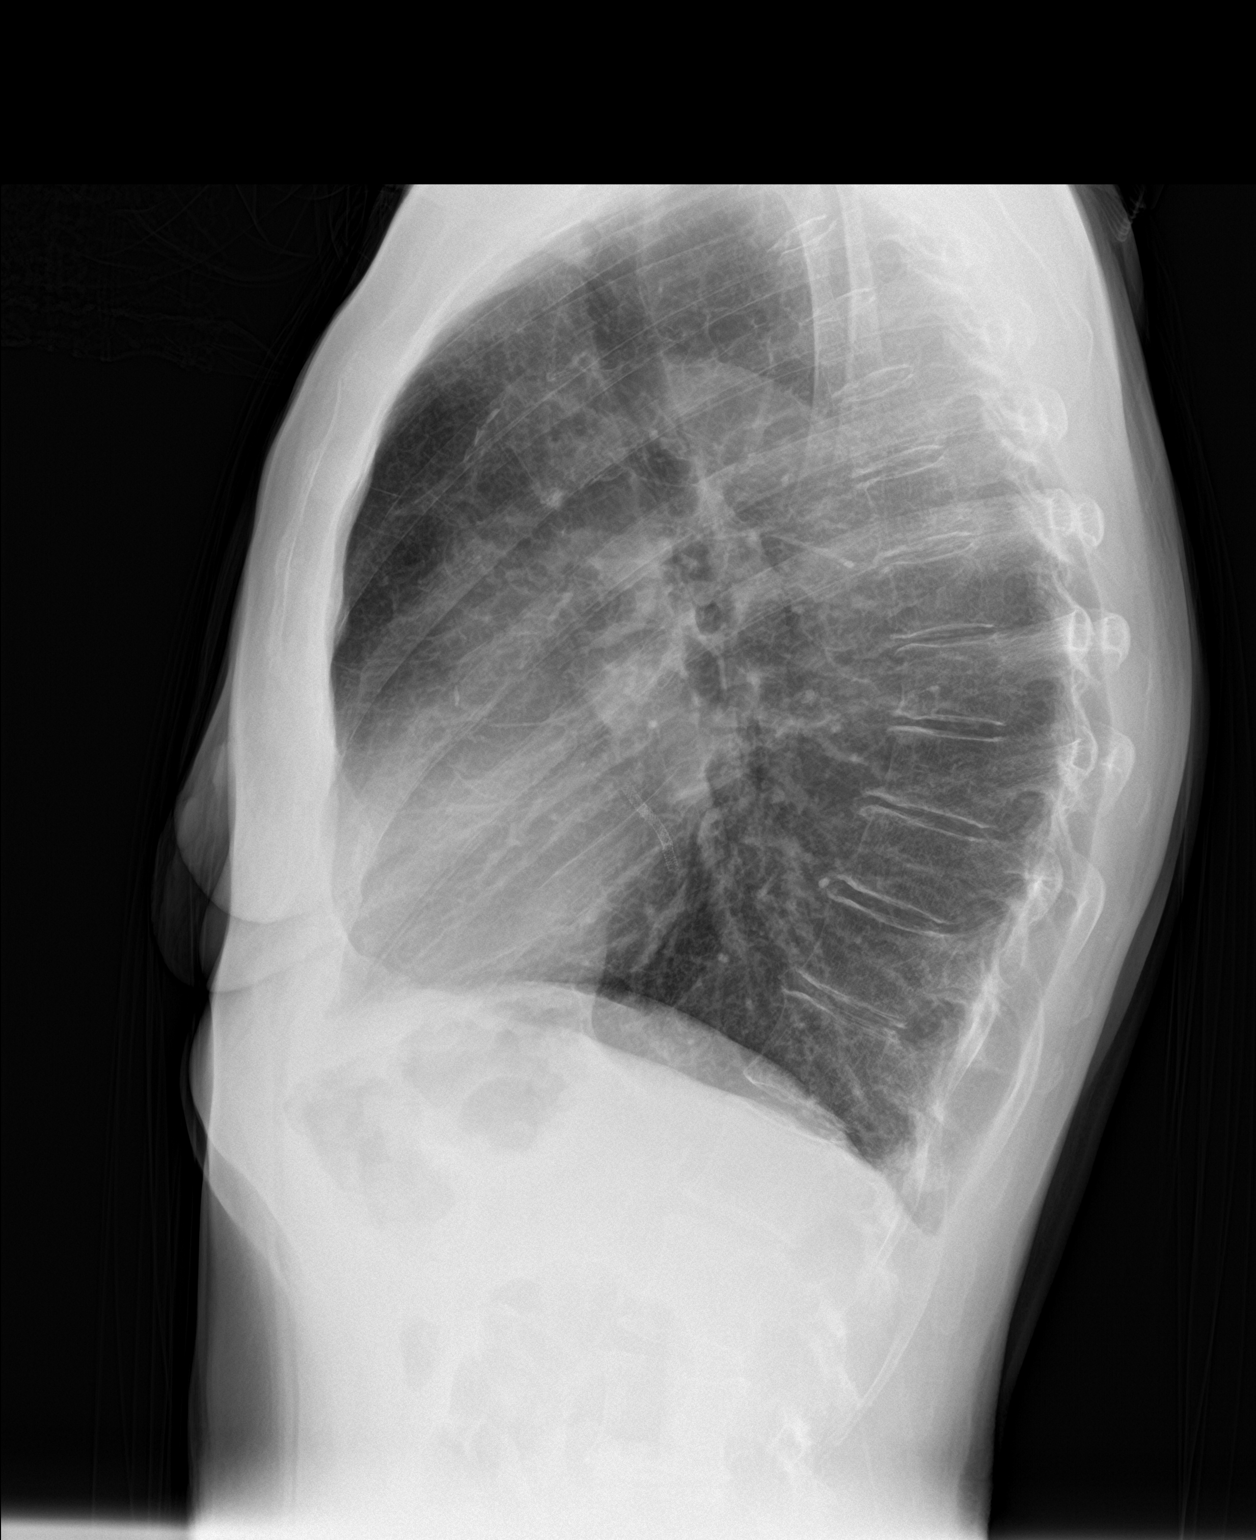

[2 of 2 positions shown; findings below may reference images not displayed]

FINDINGS: The lungs are markedly emphysematous but clear. Heart size is
normal. No pneumothorax or pleural effusion. Aortic atherosclerosis
is noted. Coronary artery stent is identified.
IMPRESSION: Severe emphysema without acute disease.

Atherosclerosis.

## 2019-04-12 NOTE — Telephone Encounter (Signed)
According to [facility] website, patient received monitor 04/09/2019.  If the patient applied the monitor when she received it, that will be ok.  She will need to remove it for her MRI.  She will not be able to re-apply it after her MRI.   If the patient has not yet applied her monitor, we recommend she applies it after her scheduled MRI on 04/16/2019.

## 2019-04-12 NOTE — Telephone Encounter (Signed)
Follow up     Pt is returning call for Michele [name]    Please call back

## 2019-04-13 ENCOUNTER — Inpatient Hospital Stay
Admission: RE | Admit: 2019-04-13 | Discharge: 2019-04-13 | Disposition: A | Discharge status: Home / Self Care | Payer: Self-pay | Source: Ambulatory Visit | Attending: Radiation Oncology | Admitting: Radiation Oncology

## 2019-04-13 DIAGNOSIS — N952 Postmenopausal atrophic vaginitis: Secondary | ICD-10-CM | POA: Diagnosis not present

## 2019-04-13 DIAGNOSIS — R350 Frequency of micturition: Secondary | ICD-10-CM | POA: Diagnosis not present

## 2019-04-13 DIAGNOSIS — N3020 Other chronic cystitis without hematuria: Secondary | ICD-10-CM | POA: Diagnosis not present

## 2019-04-16 ENCOUNTER — Other Ambulatory Visit: Payer: Self-pay

## 2019-04-16 ENCOUNTER — Ambulatory Visit (HOSPITAL_COMMUNITY)
Admission: RE | Admit: 2019-04-16 | Discharge: 2019-04-16 | Disposition: A | Discharge status: Home / Self Care | Payer: Medicare Other | Source: Ambulatory Visit | Attending: Radiation Oncology | Admitting: Radiation Oncology

## 2019-04-16 DIAGNOSIS — C7931 Secondary malignant neoplasm of brain: Secondary | ICD-10-CM | POA: Diagnosis not present

## 2019-04-16 DIAGNOSIS — C7949 Secondary malignant neoplasm of other parts of nervous system: Secondary | ICD-10-CM | POA: Insufficient documentation

## 2019-04-16 DIAGNOSIS — C3490 Malignant neoplasm of unspecified part of unspecified bronchus or lung: Secondary | ICD-10-CM | POA: Diagnosis not present

## 2019-04-16 MED ORDER — GADOBUTROL 1 MMOL/ML IV SOLN
5.0000 mL | Freq: Once | INTRAVENOUS | Status: AC | PRN
  Administered 2019-04-16: 5 mL via INTRAVENOUS

## 2019-04-19 ENCOUNTER — Telehealth: Payer: Self-pay | Admitting: Radiation Therapy

## 2019-04-19 NOTE — Telephone Encounter (Signed)
Michele [name] called concerned about her recent MRI results because she continues to have headaches and dizziness. I shared the negative results of the brain MRI. She was happy to hear the scan was negative, but interested in seeing someone to help manage her persistent headaches. I shared this with Dr. Isidore Moos and Dr. Mickeal Skinner and added her to Dr. Renda Rolls schedule as recommended. She will be seen on Thursday 11/19, so I have cancelled the follow-up with Dr. Isidore Moos on 11/20. Michele [name] is happy with this plan and thankful for the call back.    Mont Dutton R.T.(R)(T) Radiation Special Procedures Navigator

## 2019-04-22 ENCOUNTER — Ambulatory Visit: Payer: Medicare Other | Admitting: Internal Medicine

## 2019-04-23 ENCOUNTER — Ambulatory Visit: Payer: Self-pay | Admitting: Radiation Oncology

## 2019-04-23 ENCOUNTER — Other Ambulatory Visit: Payer: Self-pay | Admitting: Pulmonary Disease

## 2019-04-23 DIAGNOSIS — R002 Palpitations: Secondary | ICD-10-CM | POA: Diagnosis not present

## 2019-04-23 MED ORDER — ARNUITY ELLIPTA 100 MCG/ACT IN AEPB: 1.0000 | INHALATION_SPRAY | Freq: Every day | RESPIRATORY_TRACT | 2 refills | Status: DC

## 2019-04-24 ENCOUNTER — Other Ambulatory Visit: Payer: Self-pay | Admitting: Physician Assistant

## 2019-04-26 DIAGNOSIS — W19XXXD Unspecified fall, subsequent encounter: Secondary | ICD-10-CM | POA: Diagnosis not present

## 2019-04-26 DIAGNOSIS — T148XXA Other injury of unspecified body region, initial encounter: Secondary | ICD-10-CM | POA: Diagnosis not present

## 2019-04-26 DIAGNOSIS — Z23 Encounter for immunization: Secondary | ICD-10-CM | POA: Diagnosis not present

## 2019-04-27 ENCOUNTER — Ambulatory Visit (INDEPENDENT_AMBULATORY_CARE_PROVIDER_SITE_OTHER): Payer: Medicare Other | Admitting: Cardiology

## 2019-04-27 ENCOUNTER — Other Ambulatory Visit: Payer: Self-pay

## 2019-04-27 VITALS — BP 114/68 | HR 80 | Temp 97.2°F | Ht 65.0 in | Wt 130.0 lb

## 2019-04-27 DIAGNOSIS — I208 Other forms of angina pectoris: Secondary | ICD-10-CM | POA: Diagnosis not present

## 2019-04-27 DIAGNOSIS — I951 Orthostatic hypotension: Secondary | ICD-10-CM

## 2019-04-27 DIAGNOSIS — R002 Palpitations: Secondary | ICD-10-CM

## 2019-04-27 DIAGNOSIS — F411 Generalized anxiety disorder: Secondary | ICD-10-CM | POA: Diagnosis not present

## 2019-04-27 DIAGNOSIS — Z9861 Coronary angioplasty status: Secondary | ICD-10-CM

## 2019-04-27 DIAGNOSIS — E46 Unspecified protein-calorie malnutrition: Secondary | ICD-10-CM

## 2019-04-27 DIAGNOSIS — E785 Hyperlipidemia, unspecified: Secondary | ICD-10-CM

## 2019-04-27 DIAGNOSIS — I2510 Atherosclerotic heart disease of native coronary artery without angina pectoris: Secondary | ICD-10-CM | POA: Diagnosis not present

## 2019-04-27 MED ORDER — CLOPIDOGREL BISULFATE 75 MG PO TABS: 75.0000 mg | ORAL_TABLET | ORAL | 3 refills | Status: DC

## 2019-04-27 MED ORDER — MIDODRINE HCL 2.5 MG PO TABS: ORAL_TABLET | ORAL | 3 refills | Status: DC

## 2019-04-27 NOTE — Progress Notes (Signed)
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 water  And eat  - protein ,  Drink carnation instant breakfast  , milk shakes    If you get a a bad bruise   Do not take your Plavix  For 7 days , then restart it again taking Monday - Wednesday - Friday     Studies Ordered:   No orders of the defined types were placed in this encounter.    Glenetta Hew, M.D., M.S. Interventional Cardiologist   Pager # [phone] Phone # [phone] [address] [place],  [address]   Thank you for choosing Heartcare at [facility]!!

## 2019-04-27 NOTE — Patient Instructions (Signed)
Medication Instructions:   start taking Plavix 75 mg  Monday - Wednesday - Fridays only   Start taking Midodrine ( ProAmatine)  2.5 mg - take 1 tablet at breakfast and 1 tablet at dinnertime  *If you need a refill on your cardiac medications before your next appointment, please call your pharmacy*  Lab Work: Not needed  Testing/Procedures: Not needed  Follow-Up: At [facility], you and your health needs are our priority.  As part of our continuing mission to provide you with exceptional heart care, we have created designated Provider Care Teams.  These Care Teams include your primary Cardiologist (physician) and Advanced Practice Providers (APPs -  Physician Assistants and Nurse Practitioners) who all work together to provide you with the care you need, when you need it.  Your next appointment:   4 month(s)  The format for your next appointment:   In Person  Provider:   Jory Sims, DNP, ANP- 4 months march 2021  Dr [name]   - 8 months  July 2021  Other Instructions  Keep hydrate - drink plenty of water  And eat  - protein ,  Drink carnation instant breakfast  , milk shakes    If you get a a bad bruise   Do not take your Plavix  For 7 days , then restart it again taking Monday - Wednesday - Friday

## 2019-04-28 ENCOUNTER — Encounter: Payer: Self-pay | Admitting: Cardiology

## 2019-04-28 DIAGNOSIS — E46 Unspecified protein-calorie malnutrition: Secondary | ICD-10-CM | POA: Insufficient documentation

## 2019-04-28 NOTE — Assessment & Plan Note (Signed)
I do not think all these chest pain symptoms she is having is all related to angina. She is on low-dose Imdur which is the most she can tolerate.    She is on Plavix which has been reduced because of blue bruising.

## 2019-04-28 NOTE — Assessment & Plan Note (Signed)
Still reluctant to take SSRI or SNRI.  Using Xanax which I tried explained to her which is a worse option.  She still just giggles away her reasons for not wanting to take SSRIs.  We will continue to discuss until she agrees to do it.

## 2019-04-28 NOTE — Assessment & Plan Note (Signed)
We have backed off all of her blood pressure medications.  We have told her liberalize her salt intake which she is reluctant to do.  I told her to increase her fluid intake which she tells me she has but her daughter indicates she has not. Continue increased hydration. Use 4-prong cane or walker  She does not have any edema therefore compression stockings would not help.  Plan: Add midodrine 2.5 mg in the morning at breakfast and in the evening at dinner. (Explaining this to her and having her agreed to take it took 10 minutes)

## 2019-04-28 NOTE — Assessment & Plan Note (Signed)
Extensive CAD.  Has had several Myoview studies showing no ischemia since her most recent cath.  This is to evaluate all of these musculoskeletal sharp stabbing chest pains that she has been having.  Now echocardiogram shows normal pump function.  Not able to tolerate any medications including ARB or beta-blocker. Using low-dose Imdur along with pravastatin and Vascepa.  With significant bruising even on every other day Plavix, and when to reduce to every third day taking it Monday Wednesday and Friday.

## 2019-04-28 NOTE — Assessment & Plan Note (Signed)
Again we talked about the need for her increased nutrition.  She needs to eat more food.  She has been told that she should not eat things that have sugar because of the cancer.  At this point she needs to gain weight she is to gain muscle mass this is making her more likely to pass out.  I told her that she should eat as much of what she wants to eat, until she starts showing signs of putting on weight. We talked about meal supplementations.  I encouraged her to even eat ice cream.

## 2019-04-28 NOTE — Assessment & Plan Note (Signed)
I chose not to go down this road today.  She is on pravastatin 40 mg daily and supposed to be taking Vascepa but 2 tablets twice daily. As far as I can tell she is only taking it once a day.  She was due to have lipids checked, but they were not checked.    I would just have them checked after her follow-up visit and readdress.

## 2019-04-28 NOTE — Assessment & Plan Note (Signed)
Thankfully, the monitor really did not show anything significant.  One brief PAT run.  I spent about 10 minutes explaining this to her as well.  Unable to use beta-blocker or calcium channel blocker to treat.  And based on the fact that there were very minimal PACs and PVCs, I am happy with the choice to not treat.  A lot of this is probably related to anxiety.  Again reiterated that I think she should take an SSRI.

## 2019-05-03 ENCOUNTER — Other Ambulatory Visit: Payer: Self-pay

## 2019-05-03 ENCOUNTER — Inpatient Hospital Stay: Payer: Medicare Other | Attending: Internal Medicine | Admitting: Internal Medicine

## 2019-05-03 ENCOUNTER — Telehealth: Payer: Self-pay | Admitting: Internal Medicine

## 2019-05-03 VITALS — BP 129/69 | HR 84 | Temp 98.3°F | Resp 18 | Ht 65.0 in | Wt 130.3 lb

## 2019-05-03 DIAGNOSIS — Z87891 Personal history of nicotine dependence: Secondary | ICD-10-CM | POA: Insufficient documentation

## 2019-05-03 DIAGNOSIS — R519 Headache, unspecified: Secondary | ICD-10-CM | POA: Diagnosis not present

## 2019-05-03 DIAGNOSIS — G44219 Episodic tension-type headache, not intractable: Secondary | ICD-10-CM | POA: Diagnosis not present

## 2019-05-03 DIAGNOSIS — C3491 Malignant neoplasm of unspecified part of right bronchus or lung: Secondary | ICD-10-CM | POA: Diagnosis not present

## 2019-05-03 DIAGNOSIS — C3490 Malignant neoplasm of unspecified part of unspecified bronchus or lung: Secondary | ICD-10-CM | POA: Diagnosis not present

## 2019-05-03 DIAGNOSIS — S40811D Abrasion of right upper arm, subsequent encounter: Secondary | ICD-10-CM | POA: Diagnosis not present
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
## 2019-05-03 NOTE — Telephone Encounter (Signed)
Scheduled appt per 11/30 los. ° °Spoke with pt and she is aware of the appt date and time. °

## 2019-05-05 DIAGNOSIS — K219 Gastro-esophageal reflux disease without esophagitis: Secondary | ICD-10-CM | POA: Diagnosis not present

## 2019-05-05 DIAGNOSIS — Z85118 Personal history of other malignant neoplasm of bronchus and lung: Secondary | ICD-10-CM | POA: Diagnosis not present

## 2019-05-05 DIAGNOSIS — J449 Chronic obstructive pulmonary disease, unspecified: Secondary | ICD-10-CM | POA: Diagnosis not present

## 2019-05-05 DIAGNOSIS — I252 Old myocardial infarction: Secondary | ICD-10-CM | POA: Diagnosis not present

## 2019-05-05 DIAGNOSIS — Z7902 Long term (current) use of antithrombotics/antiplatelets: Secondary | ICD-10-CM | POA: Diagnosis not present

## 2019-05-05 DIAGNOSIS — N281 Cyst of kidney, acquired: Secondary | ICD-10-CM | POA: Diagnosis not present

## 2019-05-05 DIAGNOSIS — S51811D Laceration without foreign body of right forearm, subsequent encounter: Secondary | ICD-10-CM | POA: Diagnosis not present

## 2019-05-05 DIAGNOSIS — I25119 Atherosclerotic heart disease of native coronary artery with unspecified angina pectoris: Secondary | ICD-10-CM | POA: Diagnosis not present

## 2019-05-05 DIAGNOSIS — M8580 Other specified disorders of bone density and structure, unspecified site: Secondary | ICD-10-CM | POA: Diagnosis not present

## 2019-05-05 DIAGNOSIS — K449 Diaphragmatic hernia without obstruction or gangrene: Secondary | ICD-10-CM | POA: Diagnosis not present

## 2019-05-05 DIAGNOSIS — F329 Major depressive disorder, single episode, unspecified: Secondary | ICD-10-CM | POA: Diagnosis not present

## 2019-05-05 DIAGNOSIS — Z87891 Personal history of nicotine dependence: Secondary | ICD-10-CM | POA: Diagnosis not present

## 2019-05-05 DIAGNOSIS — Z95818 Presence of other cardiac implants and grafts: Secondary | ICD-10-CM | POA: Diagnosis not present

## 2019-05-05 DIAGNOSIS — D1723 Benign lipomatous neoplasm of skin and subcutaneous tissue of right leg: Secondary | ICD-10-CM | POA: Diagnosis not present

## 2019-05-05 DIAGNOSIS — M1990 Unspecified osteoarthritis, unspecified site: Secondary | ICD-10-CM | POA: Diagnosis not present

## 2019-05-05 DIAGNOSIS — G40909 Epilepsy, unspecified, not intractable, without status epilepticus: Secondary | ICD-10-CM | POA: Diagnosis not present

## 2019-05-05 DIAGNOSIS — G43909 Migraine, unspecified, not intractable, without status migrainosus: Secondary | ICD-10-CM | POA: Diagnosis not present

## 2019-05-07 ENCOUNTER — Telehealth: Payer: Self-pay | Admitting: Cardiology

## 2019-05-07 NOTE — Telephone Encounter (Signed)
No I want her to continue taking the medication at night like I told her to.

## 2019-05-07 NOTE — Telephone Encounter (Signed)
Pt c/o medication issue:  1. Name of Medication: midodrine (PROAMATINE) 2.5 MG tablet  2. How are you currently taking this medication (dosage and times per day)? Pt has not take in  3. Are you having a reaction (difficulty breathing--STAT)? no  4. What is your medication issue? Pt was given this medication because her BP was low. The patient has since had normal BP readings (137/77), and she wanted to know if it was OK for her to stop taking this medication.  The office can respond via MyChart messages if that is more convenient.

## 2019-05-08 DIAGNOSIS — I25119 Atherosclerotic heart disease of native coronary artery with unspecified angina pectoris: Secondary | ICD-10-CM | POA: Diagnosis not present

## 2019-05-08 DIAGNOSIS — S51811D Laceration without foreign body of right forearm, subsequent encounter: Secondary | ICD-10-CM | POA: Diagnosis not present

## 2019-05-08 DIAGNOSIS — G40909 Epilepsy, unspecified, not intractable, without status epilepticus: Secondary | ICD-10-CM | POA: Diagnosis not present

## 2019-05-08 DIAGNOSIS — G43909 Migraine, unspecified, not intractable, without status migrainosus: Secondary | ICD-10-CM | POA: Diagnosis not present

## 2019-05-08 DIAGNOSIS — J449 Chronic obstructive pulmonary disease, unspecified: Secondary | ICD-10-CM | POA: Diagnosis not present

## 2019-05-08 DIAGNOSIS — I252 Old myocardial infarction: Secondary | ICD-10-CM | POA: Diagnosis not present

## 2019-05-11 ENCOUNTER — Telehealth: Payer: Self-pay | Admitting: Cardiology

## 2019-05-11 NOTE — Telephone Encounter (Signed)
Returned call to pt states that she started a BP medication and she has not started yet-midodrine she states that her been running high 144/78 @4pm  156/81@5pm  125/66 yesterday. She states that she does not want to take this medication.

## 2019-05-11 NOTE — Telephone Encounter (Signed)
RN called patient to  Get details of b/p medication mention.  per patient she has not start any new medication since her visit with DR [name]. She did not start taking the midodrine.  she is concerned that her blood pressure is is on the high  Side " high blood pressure"  patient reprts blood pressure was 137/ 77 Friday ,138/74 Saturday , 125/66 Sunday  , today 144/78 and 30 min later 156/81.   RN  Asked if she had stopped or started any medication again since office visit . She stated no . RN asked patient about eating habits and fluid intake. Patient states she is drinking plenty of fluids but did not give an amount . She is eating and states she has been eating a saugage biscuit every morning. RN tried to reassure patient that her blood pressure was okay and that Dr [name] had informed her that range was fine. Patient states she is concerned that that is to high.  RN informed patient will discuss with Dr Gaynelle Cage and contact her back

## 2019-05-11 NOTE — Telephone Encounter (Signed)
New Message    Pt is calling to speak with Ivin Booty     Please call back

## 2019-05-11 NOTE — Telephone Encounter (Signed)
Per dpr , left message  Per Dr [name] continue taking the Midodrine 2.5 mg at bedtime. Any question may call back .  also left message through mychart.

## 2019-05-12 DIAGNOSIS — I25119 Atherosclerotic heart disease of native coronary artery with unspecified angina pectoris: Secondary | ICD-10-CM | POA: Diagnosis not present

## 2019-05-12 DIAGNOSIS — I252 Old myocardial infarction: Secondary | ICD-10-CM | POA: Diagnosis not present

## 2019-05-12 DIAGNOSIS — G40909 Epilepsy, unspecified, not intractable, without status epilepticus: Secondary | ICD-10-CM | POA: Diagnosis not present

## 2019-05-12 DIAGNOSIS — S51811D Laceration without foreign body of right forearm, subsequent encounter: Secondary | ICD-10-CM | POA: Diagnosis not present

## 2019-05-12 DIAGNOSIS — J449 Chronic obstructive pulmonary disease, unspecified: Secondary | ICD-10-CM | POA: Diagnosis not present

## 2019-05-12 DIAGNOSIS — G43909 Migraine, unspecified, not intractable, without status migrainosus: Secondary | ICD-10-CM | POA: Diagnosis not present

## 2019-05-12 NOTE — Telephone Encounter (Signed)
Forwarded message in [place].

## 2019-05-12 NOTE — Telephone Encounter (Signed)
Not unexpected.    These BP readings are fine.  --> She is not to take the medicine anyway -please tell her that I do not have any other options for her feeling dizzy and lightheaded unless she takes the medicines that I recommend. Tell her to keep the midodrine and if she has a dizzy spell on her to take it otherwise all I can tell her is to make sure she drinks plenty of water.   Her reluctance to take any medications makes caring for her very difficult and very frustrating.  Next visit we will discuss whether or not we can continue on the doctor-patient relationship.  Glenetta Hew

## 2019-05-14 DIAGNOSIS — I252 Old myocardial infarction: Secondary | ICD-10-CM | POA: Diagnosis not present

## 2019-05-14 DIAGNOSIS — S51811D Laceration without foreign body of right forearm, subsequent encounter: Secondary | ICD-10-CM | POA: Diagnosis not present

## 2019-05-14 DIAGNOSIS — G40909 Epilepsy, unspecified, not intractable, without status epilepticus: Secondary | ICD-10-CM | POA: Diagnosis not present

## 2019-05-14 DIAGNOSIS — I25119 Atherosclerotic heart disease of native coronary artery with unspecified angina pectoris: Secondary | ICD-10-CM | POA: Diagnosis not present

## 2019-05-14 DIAGNOSIS — J449 Chronic obstructive pulmonary disease, unspecified: Secondary | ICD-10-CM | POA: Diagnosis not present

## 2019-05-14 DIAGNOSIS — G43909 Migraine, unspecified, not intractable, without status migrainosus: Secondary | ICD-10-CM | POA: Diagnosis not present

## 2019-05-16 IMAGING — MR MR HEAD WO/W CM
11 series · 48 of 48 positions shown · IV contrast (MULTIHANCE)
Comparison: Brain MRI 09/20/2016

CLINICAL DATA: Small cell carcinoma of the right lung metastatic to
the brain. 4 month follow-up stereotactic radiosurgery.

Creatinine was obtained on site at [HOSPITAL] at [HOSPITAL].
Results: Creatinine 0.6 mg/dL.
EXAM:
MRI HEAD WITHOUT AND WITH CONTRAST
TECHNIQUE: Multiplanar, multiecho pulse sequences of the brain and surrounding
structures were obtained without and with intravenous contrast.
CONTRAST:  10mL MULTIHANCE GADOBENATE DIMEGLUMINE 529 MG/ML IV SOLN

[Series 2: FLAIR · sagittal · 3.0mm · 0.75mm/px · 2 of 39 slices shown (1 of 2)]
[im 1/39]
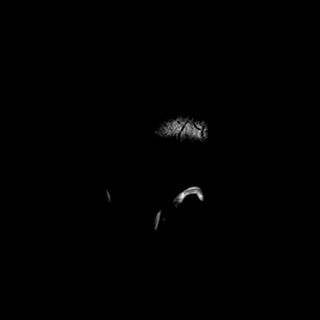
[im 39/39]
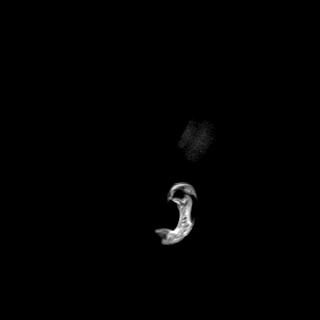

[Series 3: T2 · axial · 5.0mm · 0.57mm/px · 1 of 27 slices shown]
[im 1/27]
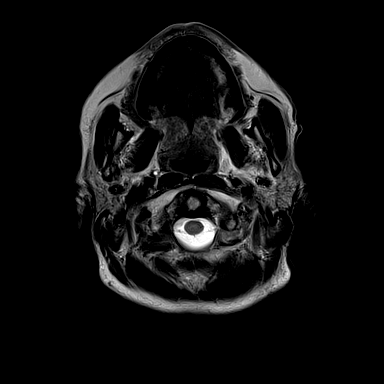

[Series 4: DWI · axial · 3.0mm · 1.50mm/px · z∈[-50,+97]mm · 5 of 78 slices shown (1 of 2)]
[im 1/78]
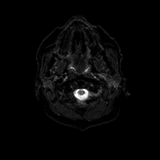
[im 20/78]
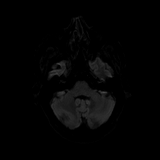
[im 39/78]
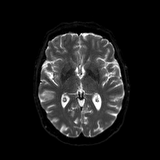
[im 58/78]
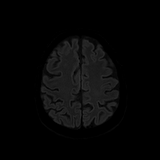
[im 78/78]
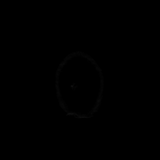

[Series 5: DWI · axial · 3.0mm · 1.50mm/px · z∈[-50,+97]mm · 3 of 39 slices shown (2 of 2)]
[im 1/39]
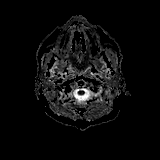
[im 20/39]
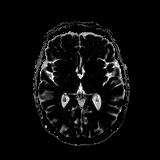
[im 39/39]
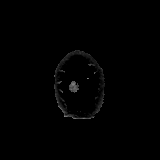

[Series 6: GRE · axial · 5.0mm · 0.57mm/px · z∈[-55,+99]mm · 2 of 27 slices shown]
[im 1/27]
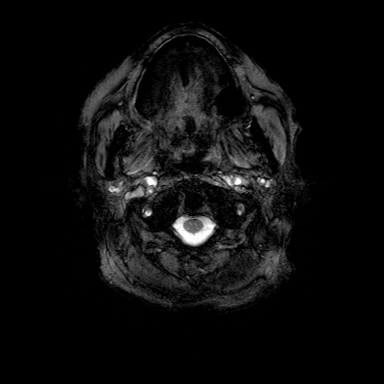
[im 27/27]
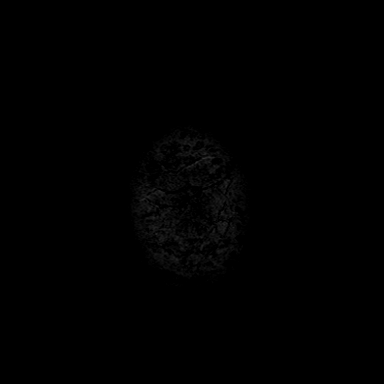

[Series 7: FLAIR · axial · 3.0mm · 0.57mm/px · z∈[-73,+80]mm · 4 of 52 slices shown (2 of 2)]
[im 1/52]
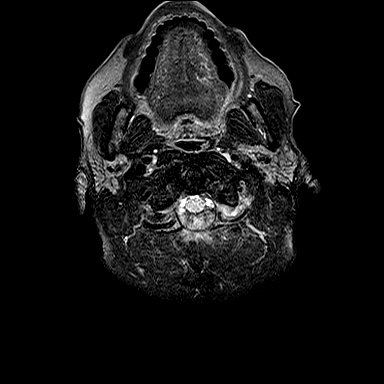
[im 18/52]
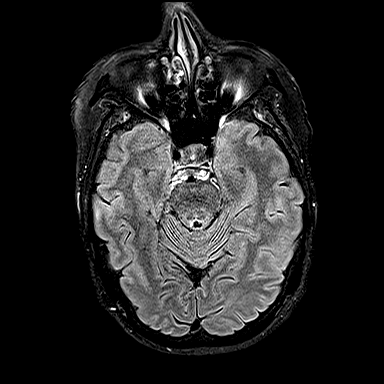
[im 35/52]
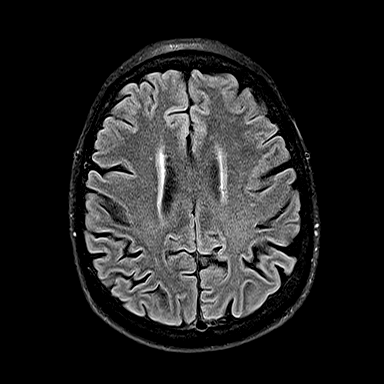
[im 52/52]
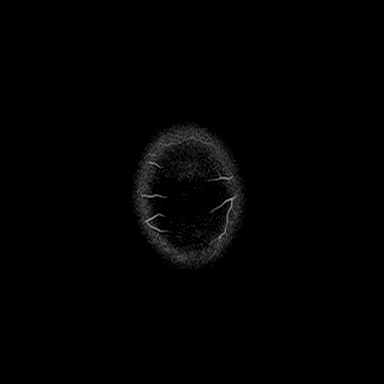

[Series 8: T1 · axial · 1.0mm · 0.75mm/px · z∈[-74,+85]mm · 11 of 160 slices shown]
[im 1/160]
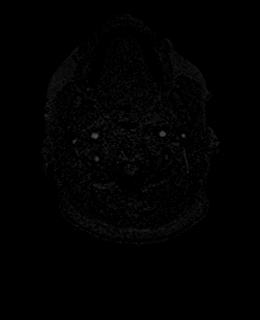
[im 16/160]
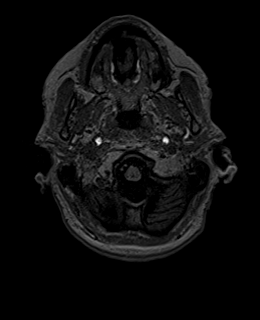
[im 32/160]
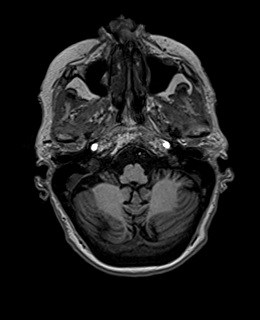
[im 48/160]
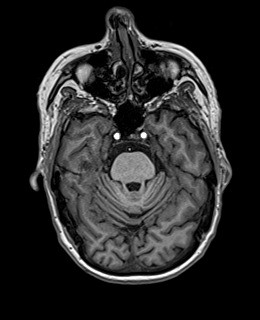
[im 64/160]
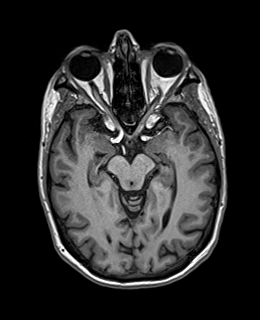
[im 80/160]
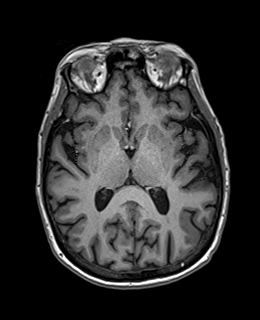
[im 96/160]
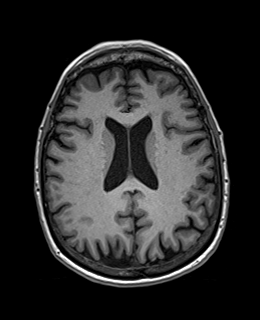
[im 112/160]
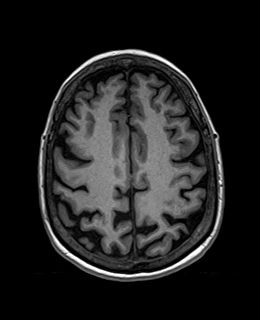
[im 128/160]
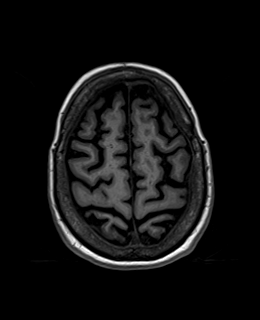
[im 144/160]
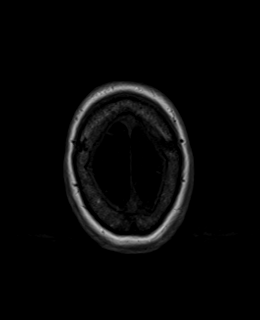
[im 160/160]
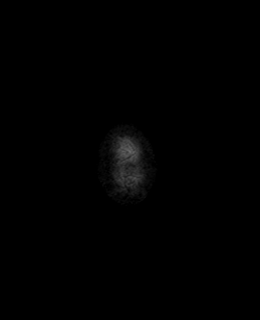

[Series 9: T2 post-contrast · coronal · 3.0mm · 0.57mm/px · 3 of 45 slices shown]
[im 1/45]
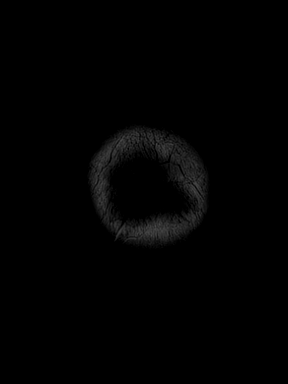
[im 23/45]
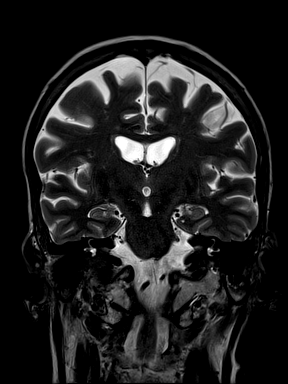
[im 45/45]
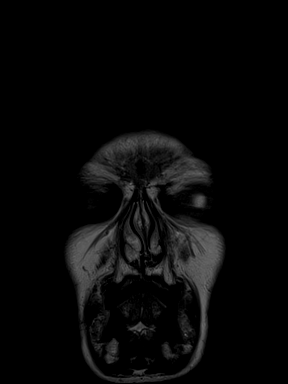

[Series 10: T1 post-contrast · axial · 1.0mm · 0.75mm/px · z∈[-74,+85]mm · 11 of 160 slices shown (1 of 2)]
[im 1/160]
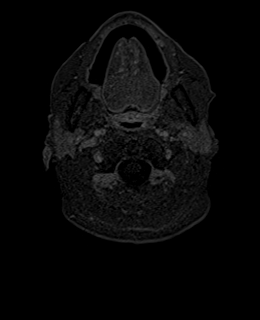
[im 16/160]
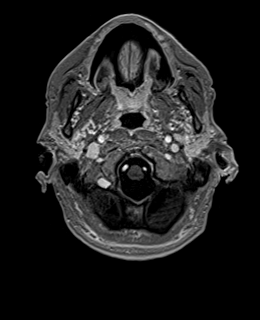
[im 32/160]
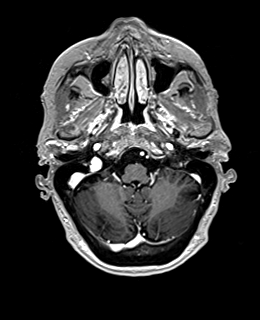
[im 48/160]
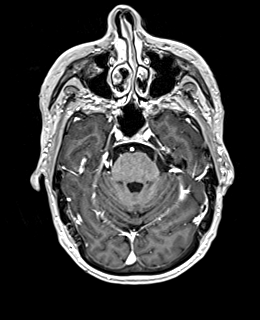
[im 64/160]
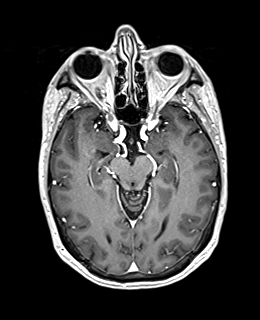
[im 80/160]
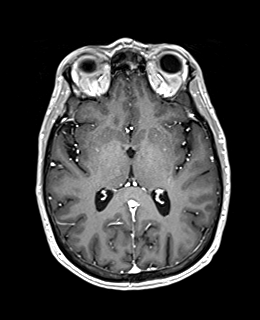
[im 96/160]
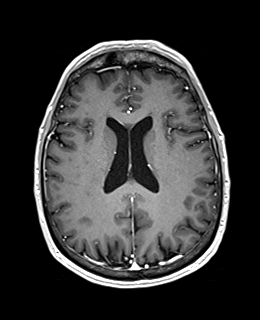
[im 112/160]
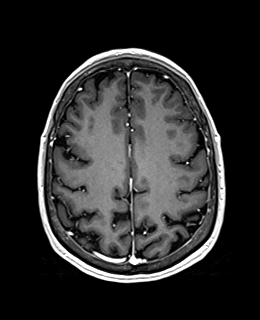
[im 128/160]
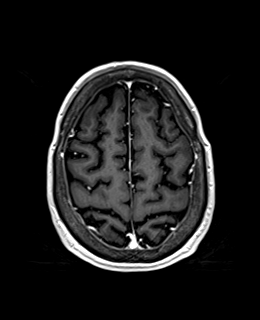
[im 144/160]
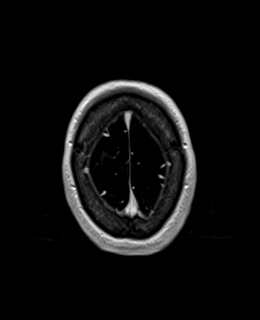
[im 160/160]
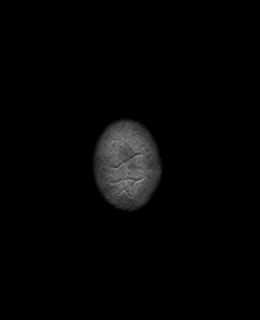

[Series 11: FLAIR post-contrast · sagittal · 3.0mm · 0.75mm/px · 3 of 39 slices shown]
[im 1/39]
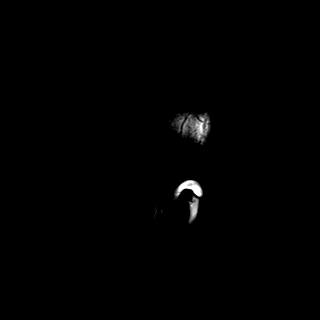
[im 20/39]
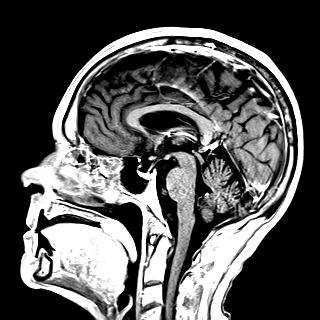
[im 39/39]
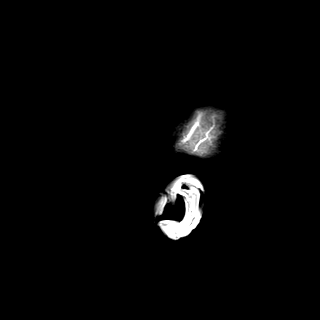

[Series 12: T1 post-contrast · coronal · 3.0mm · 0.57mm/px · 3 of 45 slices shown (2 of 2)]
[im 1/45]
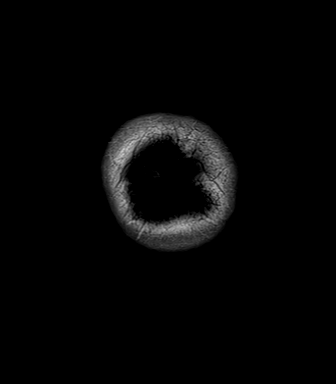
[im 23/45]
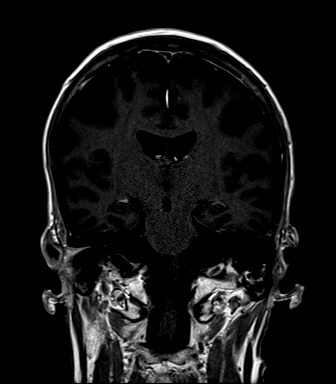
[im 45/45]
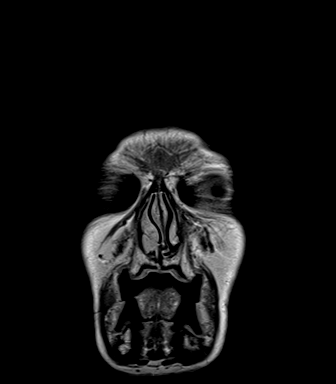

[48 of 48 positions shown; findings below may reference images not displayed]

FINDINGS: Brain: The midline structures are normal. There is no focal
diffusion restriction to indicate acute infarct. There is minimal
scattered hyperintense T2-weighted signal within the periventricular
white matter, most often seen in the setting of chronic
microvascular ischemia. No intraparenchymal hematoma or chronic
microhemorrhage. Brain volume is normal for age without age-advanced
or lobar predominant atrophy. The dura is normal and there is no
extra-axial collection. No contrast-enhancing lesions.

Vascular: Major intracranial arterial and venous sinus flow voids
are preserved.

Skull and upper cervical spine: The visualized skull base,
calvarium, upper cervical spine and extracranial soft tissues are
normal.

Sinuses/Orbits: No fluid levels or advanced mucosal thickening.
Small amount of left mastoid fluid. Normal orbits.
IMPRESSION: No intracranial or calvarial metastatic disease.

## 2019-05-18 DIAGNOSIS — J449 Chronic obstructive pulmonary disease, unspecified: Secondary | ICD-10-CM | POA: Diagnosis not present

## 2019-05-18 DIAGNOSIS — I252 Old myocardial infarction: Secondary | ICD-10-CM | POA: Diagnosis not present

## 2019-05-18 DIAGNOSIS — S51811D Laceration without foreign body of right forearm, subsequent encounter: Secondary | ICD-10-CM | POA: Diagnosis not present

## 2019-05-18 DIAGNOSIS — G43909 Migraine, unspecified, not intractable, without status migrainosus: Secondary | ICD-10-CM | POA: Diagnosis not present

## 2019-05-18 DIAGNOSIS — I25119 Atherosclerotic heart disease of native coronary artery with unspecified angina pectoris: Secondary | ICD-10-CM | POA: Diagnosis not present

## 2019-05-18 DIAGNOSIS — G40909 Epilepsy, unspecified, not intractable, without status epilepticus: Secondary | ICD-10-CM | POA: Diagnosis not present

## 2019-05-19 DIAGNOSIS — S51811D Laceration without foreign body of right forearm, subsequent encounter: Secondary | ICD-10-CM | POA: Diagnosis not present

## 2019-05-19 DIAGNOSIS — G43909 Migraine, unspecified, not intractable, without status migrainosus: Secondary | ICD-10-CM | POA: Diagnosis not present

## 2019-05-19 DIAGNOSIS — I25119 Atherosclerotic heart disease of native coronary artery with unspecified angina pectoris: Secondary | ICD-10-CM | POA: Diagnosis not present

## 2019-05-19 DIAGNOSIS — I252 Old myocardial infarction: Secondary | ICD-10-CM | POA: Diagnosis not present

## 2019-05-19 DIAGNOSIS — G40909 Epilepsy, unspecified, not intractable, without status epilepticus: Secondary | ICD-10-CM | POA: Diagnosis not present

## 2019-05-19 DIAGNOSIS — Z7902 Long term (current) use of antithrombotics/antiplatelets: Secondary | ICD-10-CM | POA: Diagnosis not present

## 2019-05-19 DIAGNOSIS — J449 Chronic obstructive pulmonary disease, unspecified: Secondary | ICD-10-CM | POA: Diagnosis not present

## 2019-05-19 DIAGNOSIS — Z95818 Presence of other cardiac implants and grafts: Secondary | ICD-10-CM | POA: Diagnosis not present

## 2019-05-19 IMAGING — CT CT ABD-PELV W/ CM
2 of 5 series · 16 of 46 positions shown, 18 images · IV contrast (ISOVUE)
Comparison: October 14, 2016

CLINICAL DATA: Generalized body pain for 3 months, worsening over
the last 2 days. Increasing weight loss for a few months. History of
lung cancer.

EXAM:
CT ABDOMEN AND PELVIS WITH CONTRAST
TECHNIQUE: Multidetector CT imaging of the abdomen and pelvis was performed
using the standard protocol following bolus administration of
intravenous contrast.
CONTRAST:  30mL [id number] IOPAMIDOL ([id number]) INJECTION 61%,
100mL [id number] IOPAMIDOL ([id number]) INJECTION 61%

[Series 2: abd/pel with · axial · 0.64mm/px · z∈[-394,-29]mm · 13 of 83 slices shown, 15 images]
[im 5/83  soft-tissue]
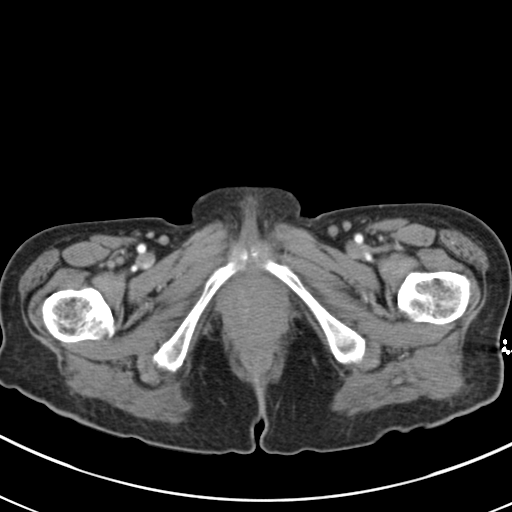
[im 5/83  bone]
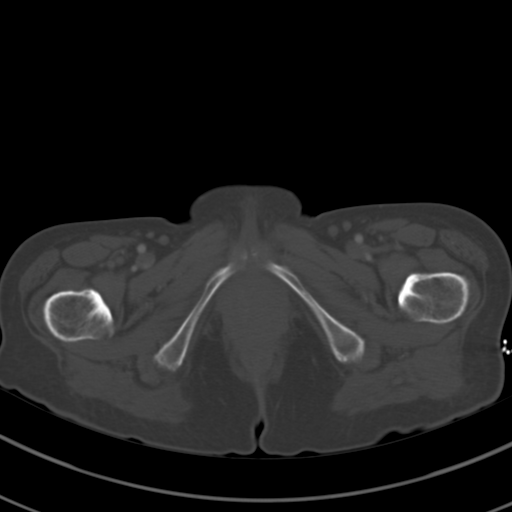
[im 10/83  soft-tissue]
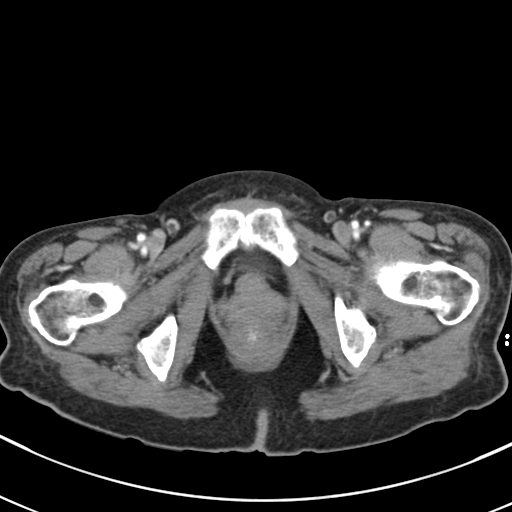
[im 20/83  soft-tissue]
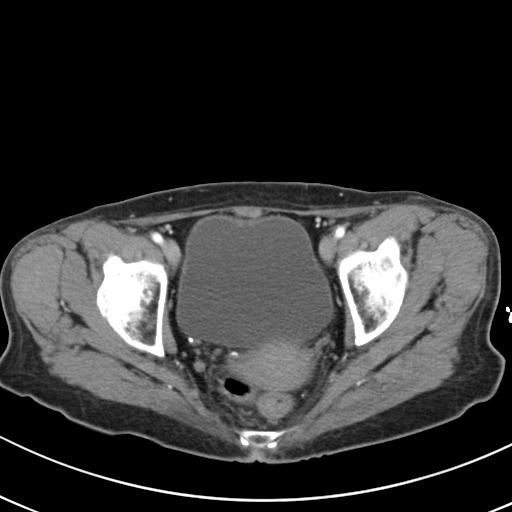
[im 25/83  soft-tissue]
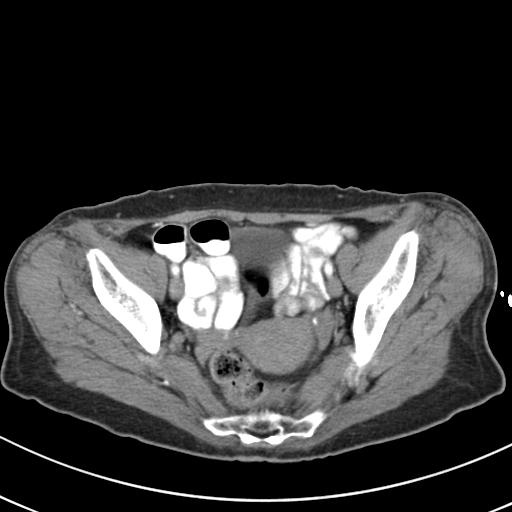
[im 29/83  soft-tissue]
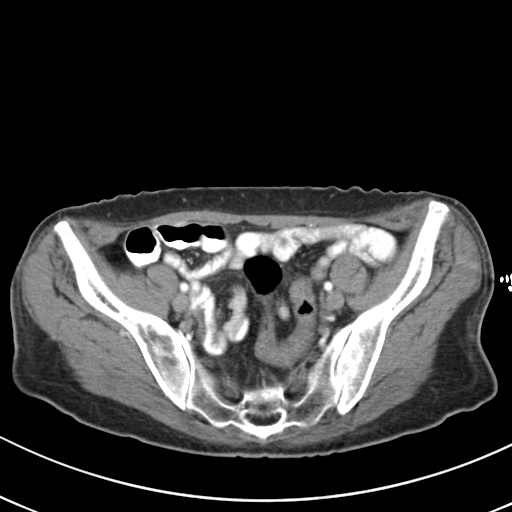
[im 34/83  soft-tissue]
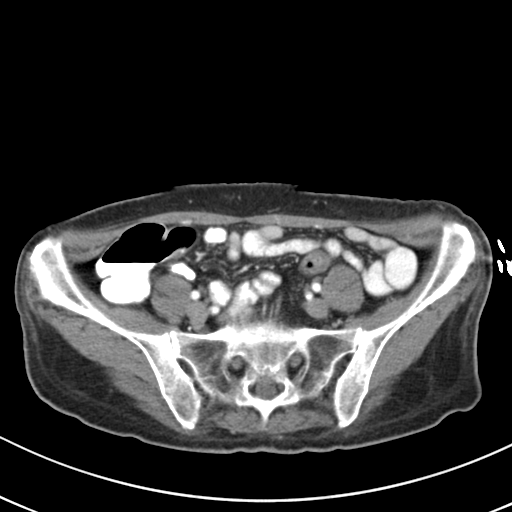
[im 44/83  soft-tissue]
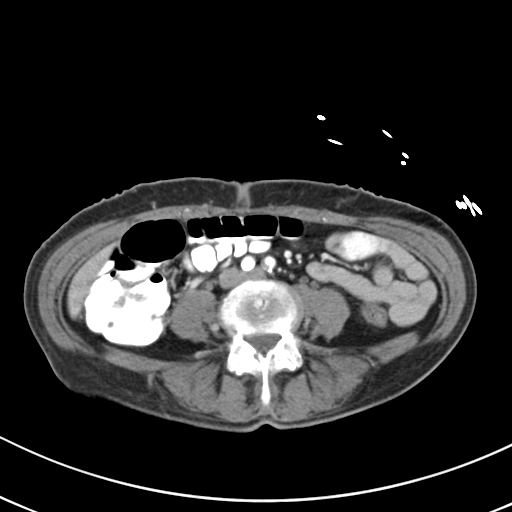
[im 49/83  soft-tissue]
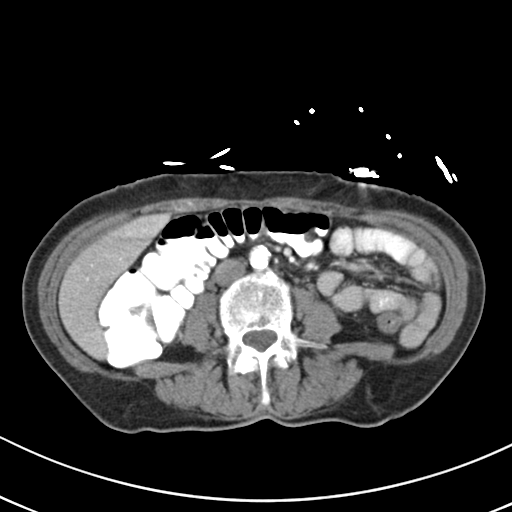
[im 54/83  soft-tissue]
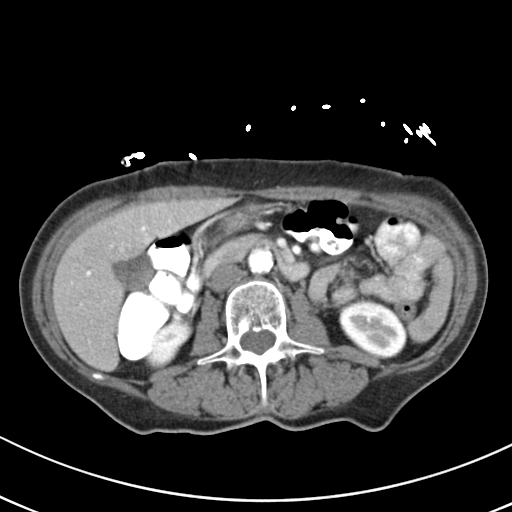
[im 54/83  bone]
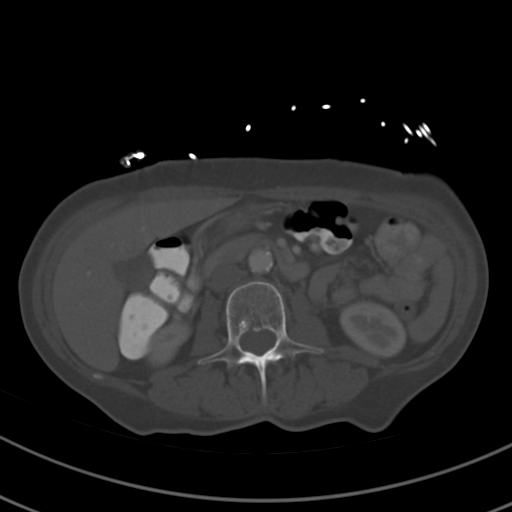
[im 58/83  soft-tissue]
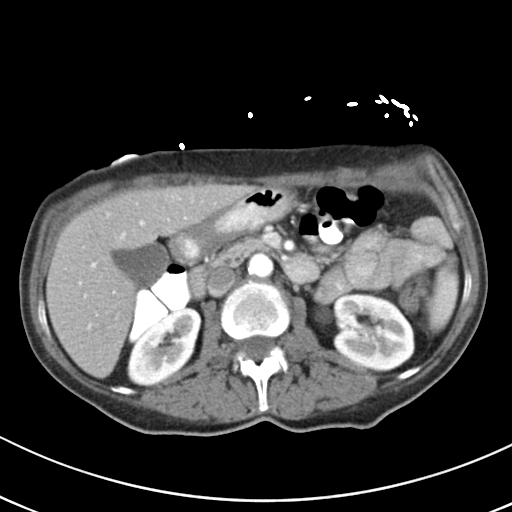
[im 63/83  soft-tissue]
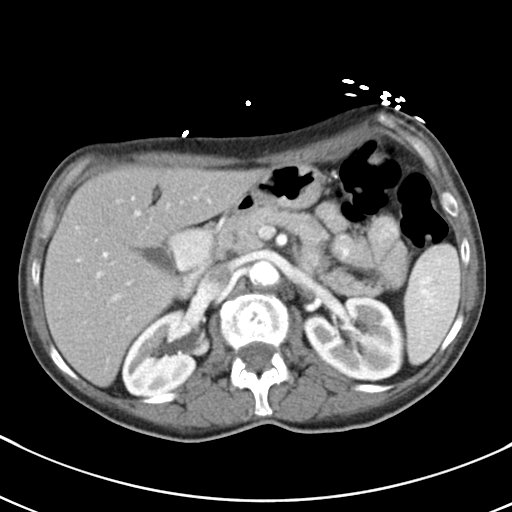
[im 73/83  soft-tissue]
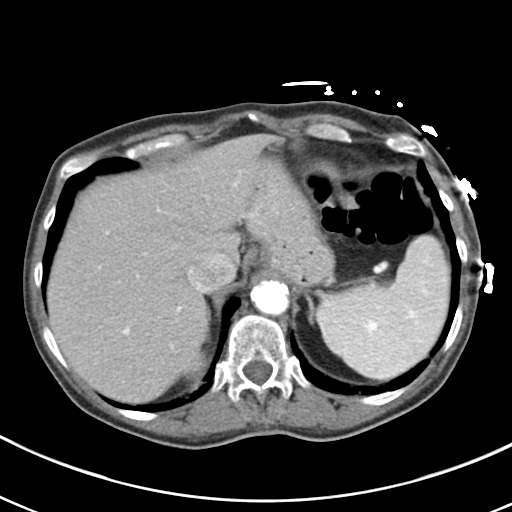
[im 78/83  soft-tissue]
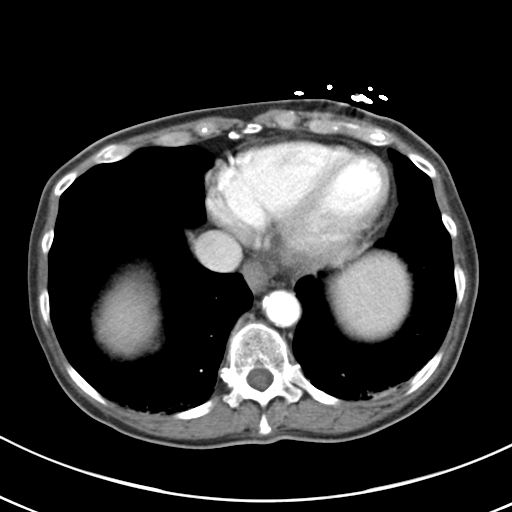

[Series 4: coronal a/|p · coronal · 0.64mm/px · 3 of 111 slices shown]
[im 37/111  soft-tissue]
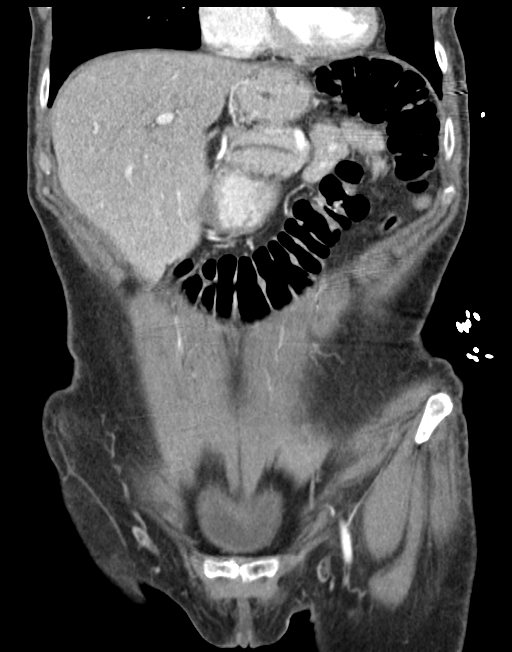
[im 49/111  soft-tissue]
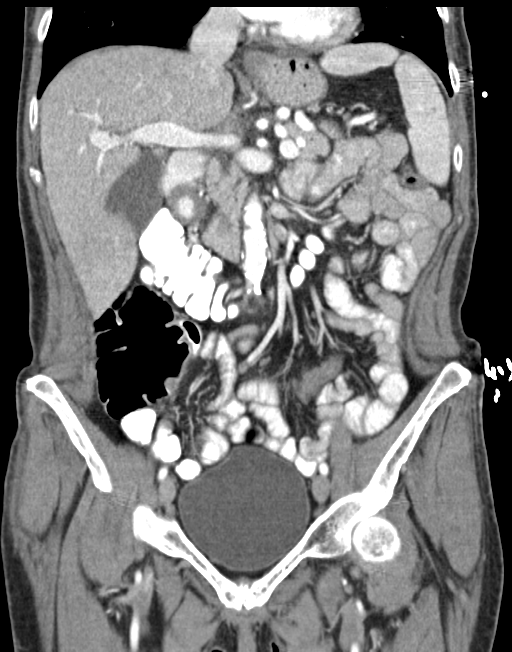
[im 62/111  soft-tissue]
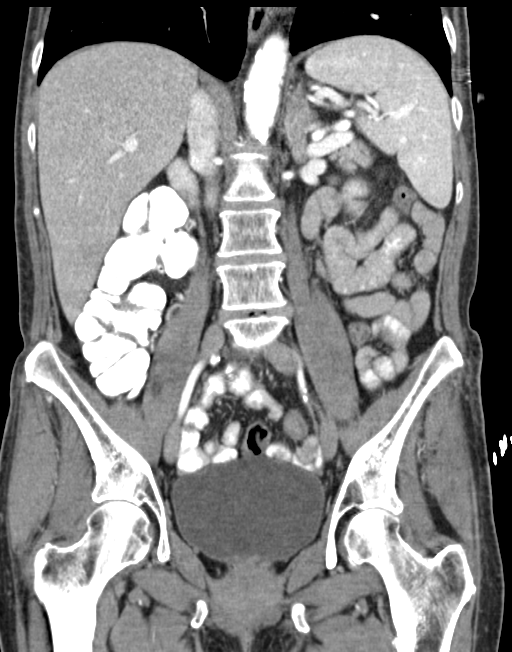

[16 of 46 positions shown; findings below may reference images not displayed]

FINDINGS: Lower chest: Emphysematous changes are seen in the lung bases.
Atelectasis is seen dependently. There is a small hiatal hernia. The
lung bases are otherwise normal.

Hepatobiliary: Hepatic steatosis. Otherwise, the liver, gallbladder,
and portal vein are normal.

Pancreas: Unremarkable. No pancreatic ductal dilatation or
surrounding inflammatory changes.

Spleen: Normal in size without focal abnormality.

Adrenals/Urinary Tract: Previously described tiny adrenal adenomas
are unchanged. No new adrenal abnormalities. A right renal cyst is
identified. No suspicious masses in either kidney. No hydronephrosis
or perinephric stranding. No stones along the course of either
ureter. The bladder is normal.

Stomach/Bowel: The stomach and small bowel are normal. The colon is
normal. The appendix is not visualized but there is no secondary
evidence of appendicitis.

Vascular/Lymphatic: Atherosclerotic change is seen in the non
aneurysmal aorta, iliac vessels, and femoral vessels. No adenopathy.

Reproductive: Uterus and bilateral adnexa are unremarkable.

Other: No free air or free fluid.  No significant hernia.

Musculoskeletal: No acute or significant osseous findings.
IMPRESSION: 1. No acute abnormality identified.
2. Emphysematous changes in the lung bases.
3. Atherosclerotic changes in the aorta and iliac vessels.
4. Small adrenal adenomas, unchanged.
5. No other acute abnormalities.
Aortic Atherosclerosis (2COMA-77S.S) and Emphysema (2COMA-X1Y.6).

## 2019-05-20 ENCOUNTER — Telehealth: Payer: Self-pay | Admitting: Pulmonary Disease

## 2019-05-20 NOTE — Telephone Encounter (Signed)
Called and spoke with pt asking if she knew who had tried calling her and pt did not. Stated to pt that we do have her scheduled for OV 12/21 and she verbalized understanding.nothing further needed.

## 2019-05-21 ENCOUNTER — Other Ambulatory Visit (HOSPITAL_COMMUNITY)
Admission: RE | Admit: 2019-05-21 | Discharge: 2019-05-21 | Disposition: A | Discharge status: Home / Self Care | Payer: Medicare Other | Source: Ambulatory Visit | Attending: Pulmonary Disease | Admitting: Pulmonary Disease

## 2019-05-21 ENCOUNTER — Other Ambulatory Visit (HOSPITAL_COMMUNITY): Payer: Medicare Other

## 2019-05-21 DIAGNOSIS — Z01812 Encounter for preprocedural laboratory examination: Secondary | ICD-10-CM | POA: Insufficient documentation

## 2019-05-21 DIAGNOSIS — U071 COVID-19: Secondary | ICD-10-CM | POA: Diagnosis not present

## 2019-05-21 LAB — SARS CORONAVIRUS 2 (TAT 6-24 HRS): SARS Coronavirus 2: POSITIVE — AB

## 2019-05-22 ENCOUNTER — Telehealth: Payer: Self-pay | Admitting: Critical Care Medicine

## 2019-05-22 ENCOUNTER — Telehealth: Payer: Self-pay | Admitting: Unknown Physician Specialty

## 2019-05-22 NOTE — Telephone Encounter (Signed)
Called to discuss with patient about Covid symptoms and the use of bamlanivimab, a monoclonal antibody infusion for those with mild to moderate Covid symptoms and at a high risk of hospitalization.  Pt is qualified for this infusion at the [facility] infusion center due to Age > 65   Message left to call back

## 2019-05-22 NOTE — Progress Notes (Signed)
Notified Dr Carlis Abbott of positive covid test from yesterday.  She will inform patient.

## 2019-05-22 NOTE — Telephone Encounter (Signed)
I called Ms. Grinnell and left messages both on her cell phone and home phone number. I was able to reach her through her significant other [employer]. I notifed her of her positive COVID test and need to quarantine. She should quarantine for 10 days, and if she develops symptoms during that time she should quarantine for a full 2 weeks per the [facility] recommendation. Her significant other lives with her, and he should also quarantine. If she develops worse SOB from her baseline she should go to the ED to be evaluated. She should notify anyone that she has been in contact with recently.  She will not be able to have her PFTs or office visit performed on Monday as planned.  Julian Hy, DO 05/22/19 9:20 AM [facility]

## 2019-05-22 NOTE — Telephone Encounter (Signed)
Michele [name] called back a second time about concerns- lungs hurting "worse" and her legs peeling. Earlier she said she has had muscle aches for several days. She has a history of cancer.  She has told me several times she is worried that she has COPD and her lungs are hurting. Since I cannot evaluate her over the phone, she has again been recommended to go to the emergency department and let them know when she gets there that she has covid.   Julian Hy, DO 05/22/19 2:30 PM [facility]

## 2019-05-23 DIAGNOSIS — Z8616 Personal history of COVID-19: Secondary | ICD-10-CM | POA: Insufficient documentation

## 2019-05-23 DIAGNOSIS — U071 COVID-19: Secondary | ICD-10-CM | POA: Insufficient documentation

## 2019-05-23 NOTE — Telephone Encounter (Signed)
See message from Dr. Carlis Abbott. Pt needs PFTs rescheduled. Pt visit needs to be converted to virtual visit.   Wyn Quaker FNP

## 2019-05-23 NOTE — Progress Notes (Signed)
Virtual Visit via Telephone Note  I connected with Michele [name] on 05/24/19 at 10:30 AM EST by telephone and verified that I am speaking with the correct person using two identifiers.  Location: Patient: Home Provider: Office [facility] - [address], [place], [place], [place] [address]   I discussed the limitations, risks, security and privacy concerns of performing an evaluation and management service by telephone and the availability of in person appointments. I also discussed with the patient that there may be a patient responsible charge related to this service. The patient expressed understanding and agreed to proceed.  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  Smoker/ Smoking History: Former smoker.  Quit March/2019.  60-pack-year smoking history. Maintenance: Spiriva HandiHaler, Arnuity Ellipta Pt of: Dr. Halford Chessman  Chief complaint: Recent COVID-19 positive result    63 year old female former smoker followed in our office for COPD/emphysema, bronchiectasis.  Patient was originally scheduled to be coming to our office to complete a pulmonary function test.  Patient completed outpatient preprocedural Covid testing.  Preprocedural Covid testing showed a positive result.  Patient's office visit was converted to a telephonic visit to further evaluate.  Patient would likely qualify for the outpatient monoclonal antibody infusion if symptomatic as she has COPD and is over the age of 22.  We will discuss that today.  Patient will need to be rescheduled for a pulmonary function test 21 days since receiving the positive test result.  Patient was reached telephonically today to ensure that she did not present to our office for her pulmonary function test that  she tested positive for Covid.  She reported that she was symptomatic.  That her office visit was converted to a televisit.  Patient reporting that she is having body aches, she feels that her lungs hurt, she has not checked her temperature but she reports that she does not feel hot.  Upon the interview of the patient over the phone I requested for her to check her temperature.  Temperature today is 97.1.  Oxygen levels are 98% on room air.  Her heart rate is 96.  She is coughing.  She has a productive cough with thick white to yellow mucus.  Patient reports adherence to her inhalers.  She reports that she needs refills of her Xopenex nebulized medications.  She has concerns that she may be having pneumonia.  She also lives with her boyfriend who is currently symptomatic.  He has not been tested yet.   Observations/Objective:  05/24/2019 - temp - 97.1 05/24/2019 - SPO2 - 98 on RA  05/24/2019 - HR - 96  Pulmonary tests:  PFT 12/23/11 >> FEV1 2.13 (85%), FEV1% 59, TLC 5.98 (112%), DLCO 60% Spirometry 11/19/17 >> FEV1 2.58 (100%), FEV1% 76 FeNO 11/19/17 >> 5   Chest imaging:  CT chest 08/24/16 >> CAD, mod/severe emphysema CT chest 08/29/17 >> severe emphysema CT chest 03/02/18 >> severe centrilobular and paraseptal emphysema, 4 mm nodule RUL partially calcified CT angio chest 04/27/18 >> atherosclerosis, small/mod hiatal hernia, dilation of esophagus with mucosal thickening, severe emphysema with large bulla, bronchiectasis 06/12/2018-chest x-ray COPD and chronic interstitial lung disease, mild infiltrate noted anteriorly on the lateral view only, follow-up chest x-ray in 4 weeks following trial of antibiotic therapy 07/10/2018-chest x-ray- COPD without acute abnormality,  previously seen density has resolved in the interval  Cardiac tests:  Echo 08/30/17 >> EF 60 to 65%, grade 1 DD, mild MR  Social History   Tobacco Use  Smoking Status Former Smoker  . Packs/day: 1.50  . Years: 40.00  . Pack  years: 60.00  . Types: Cigarettes  . Quit date: 08/2017  . Years since quitting: 1.8  Smokeless Tobacco Never Used  Tobacco Comment   04/15/12 "I quit once for 2 1/2 years; smoking cessation counselor already here to visit"; done to less than 1/2 ppd (03/02/2013) - "1 pack per week" - 05/24/13 - ACTUALLY QUIT 08/2017   Immunization History  Administered Date(s) Administered  . Influenza Split 03/03/2012, 02/19/2017  . Influenza Whole 03/21/2006, 03/06/2007, 04/01/2010  . Influenza,inj,Quad PF,6+ Mos 03/03/2014, 03/04/2016, 03/10/2018  . Influenza-Unspecified 02/07/2017  . Td 06/03/2008      Assessment and Plan:  Bronchiectasis without complication ([place]) Plan: Continue flutter valve use Take Mucinex 600 mg daily  Hydrate well Augmentin today  COPD (chronic obstructive pulmonary disease) ([place]) Plan: Continue Arnuity 100 Continue Spiriva HandiHaler We will need to reschedule pulmonary function testing Patient to follow-up with our office on 06/02/2019 as a telephonic visit to monitor symptoms Referred to [facility] infusion clinic to see if patient will qualify for monoclonal antibody infusion   COVID-19 virus detected Plan: We will treat empirically with Augmentin due to patient's history of bronchitis, bronchiectasis, COPD We will refer to infusion center at [facility] to see if patient will qualify for monoclonal antibody infusion outpatient Emphasized the need for the patient to monitor temperatures daily at least Order placed for MyChart home monitoring Televisit scheduled for 06/02/2019 to monitor patient symptoms Emphasized patient to monitor oxygen saturations at home multiple times a day and notify our office if she is persistently below 94% Encourage patient to lay in prone positioning as able Encourage patient to have her boyfriend who she lives with to obtain outpatient Covid testing, Covid testing number provided for patient  Spoke with Lazaro Arms, NP  regarding infusion.  Lazaro Arms reports that she will contact the patient directly.   Follow Up Instructions:  Return in about 9 days (around 06/02/2019), or if symptoms worsen or fail to improve, for Follow up with Wyn Quaker FNP-C.   I discussed the assessment and treatment plan with the patient. The patient was provided an opportunity to ask questions and all were answered. The patient agreed with the plan and demonstrated an understanding of the instructions.   The patient was advised to call back or seek an in-person evaluation if the symptoms worsen or if the condition fails to improve as anticipated.  I provided 32 minutes of non-face-to-face time during this encounter.   Lauraine Rinne, NP

## 2019-05-24 ENCOUNTER — Other Ambulatory Visit: Payer: Self-pay | Admitting: Nurse Practitioner

## 2019-05-24 ENCOUNTER — Ambulatory Visit: Payer: Medicare Other | Admitting: Pulmonary Disease

## 2019-05-24 ENCOUNTER — Telehealth: Payer: Self-pay | Admitting: Pulmonary Disease

## 2019-05-24 ENCOUNTER — Ambulatory Visit (INDEPENDENT_AMBULATORY_CARE_PROVIDER_SITE_OTHER): Payer: Medicare Other | Admitting: Pulmonary Disease

## 2019-05-24 ENCOUNTER — Other Ambulatory Visit: Payer: Self-pay

## 2019-05-24 ENCOUNTER — Encounter: Payer: Self-pay | Admitting: Pulmonary Disease

## 2019-05-24 DIAGNOSIS — U071 COVID-19: Secondary | ICD-10-CM

## 2019-05-24 DIAGNOSIS — J479 Bronchiectasis, uncomplicated: Secondary | ICD-10-CM

## 2019-05-24 DIAGNOSIS — J449 Chronic obstructive pulmonary disease, unspecified: Secondary | ICD-10-CM | POA: Diagnosis not present

## 2019-05-24 DIAGNOSIS — I10 Essential (primary) hypertension: Secondary | ICD-10-CM

## 2019-05-24 DIAGNOSIS — J439 Emphysema, unspecified: Secondary | ICD-10-CM

## 2019-05-24 MED ORDER — AMOXICILLIN-POT CLAVULANATE 875-125 MG PO TABS: 1.0000 | ORAL_TABLET | Freq: Two times a day (BID) | ORAL | 0 refills | Status: DC

## 2019-05-24 MED ORDER — LEVALBUTEROL HCL 0.63 MG/3ML IN NEBU: 0.6300 mg | INHALATION_SOLUTION | RESPIRATORY_TRACT | 5 refills | Status: DC | PRN

## 2019-05-24 NOTE — Assessment & Plan Note (Addendum)
Plan: We will treat empirically with Augmentin due to patient's history of bronchitis, bronchiectasis, COPD We will refer to infusion center at [facility] to see if patient will qualify for monoclonal antibody infusion outpatient Emphasized the need for the patient to monitor temperatures daily at least Order placed for MyChart home monitoring Televisit scheduled for 06/02/2019 to monitor patient symptoms Emphasized patient to monitor oxygen saturations at home multiple times a day and notify our office if she is persistently below 94% Encourage patient to lay in prone positioning as able Encourage patient to have her boyfriend who she lives with to obtain outpatient Covid testing, Covid testing number provided for patient  Spoke with Lazaro Arms, NP regarding infusion.  Lazaro Arms reports that she will contact the patient directly.

## 2019-05-24 NOTE — Telephone Encounter (Signed)
Patient had telephone visit today with Wyn Quaker, NP. Called the patient and advised her that she was directed to take the medication as prescribed, it does not have any influence on the infusion discussed during the telephone visit.  Advised the patient to take the medication and complete as directed. Patient voiced understanding. Nothing further needed at this time.

## 2019-05-24 NOTE — Progress Notes (Signed)
Reviewed and agree with assessment/plan.   Rukia Mcgillivray, MD [facility] 05/29/2016, 12:24 PM Pager:  [phone]  

## 2019-05-24 NOTE — Patient Instructions (Addendum)
You were seen today by Lauraine Rinne, NP  for:   1. COVID-19 virus detected  - MYCHART COVID-19 HOME MONITORING PROGRAM; Future - Temperature monitoring; Future  I believe you will qualify for the monoclonal antibody infusion I will refer you to the [facility] infusion center They should be contacting you regarding scheduling this  Please monitor your symptoms Check oxygen levels multiple times a day notify our office if you are oxygen levels are below 94% consistently Try to lay on your belly or what we call prone positioning as able to help with symptoms  2. Pulmonary emphysema, unspecified emphysema type (HCC)  - amoxicillin-clavulanate (AUGMENTIN) 875-125 MG tablet; Take 1 tablet by mouth 2 (two) times daily.  Dispense: 14 tablet; Refill: 0  Continue Arnuity Ellipta 100 Continue Spiriva HandiHaler  I have refilled your Xopenex nebulized medication   3. Bronchiectasis without complication (HCC)  - amoxicillin-clavulanate (AUGMENTIN) 875-125 MG tablet; Take 1 tablet by mouth 2 (two) times daily.  Dispense: 14 tablet; Refill: 0  Bronchiectasis: This is the medical term which indicates that you have damage, dilated airways making you more susceptible to respiratory infection. Use a flutter valve 10 breaths twice a day or 4 to 5 breaths 4-5 times a day to help clear mucus out Let us know if you have cough with change in mucus color or fevers or chills.  At that point you would need an antibiotic. Maintain a healthy nutritious diet, eating whole foods Take your medications as prescribed    We recommend today:   Meds ordered this encounter  Medications  . amoxicillin-clavulanate (AUGMENTIN) 875-125 MG tablet    Sig: Take 1 tablet by mouth 2 (two) times daily.    Dispense:  14 tablet    Refill:  0  . levalbuterol (XOPENEX) 0.63 MG/3ML nebulizer solution    Sig: Take 3 mLs (0.63 mg total) by nebulization every 4 (four) hours as needed for wheezing or shortness of breath (dx:  R00.2, J44.9).    Dispense:  150 mL    Refill:  5    Follow Up:    Return in about 9 days (around 06/02/2019), or if symptoms worsen or fail to improve, for Follow up with Wyn Quaker FNP-C.   Please do your part to reduce the spread of COVID-19:      Reduce your risk of any infection  and COVID19 by using the similar precautions used for avoiding the common cold or flu:  [name] Wash your hands often with soap and warm water for at least 20 seconds.  If soap and water are not readily available, use an alcohol-based hand sanitizer with at least 60% alcohol.  . If coughing or sneezing, cover your mouth and nose by coughing or sneezing into the elbow areas of your shirt or coat, into a tissue or into your sleeve (not your hands). [name] A MASK when in public  . Avoid shaking hands with others and consider head nods or verbal greetings only. . Avoid touching your eyes, nose, or mouth with unwashed hands.  . Avoid close contact with people who are sick. . Avoid places or events with large numbers of people in one location, like concerts or sporting events. . If you have some symptoms but not all symptoms, continue to monitor at home and seek medical attention if your symptoms worsen. . If you are having a medical emergency, call 911.   ADDITIONAL HEALTHCARE OPTIONS FOR PATIENTS  [place] [facility] / e-Visit: [email or web address]  [facility]: [facility]: [phone]                   [facility]: [phone]     It is flu season:   >>> Best ways to protect herself from the flu: Receive the yearly flu vaccine, practice good hand hygiene washing with soap and also using hand sanitizer when available, eat a nutritious meals, get adequate rest, hydrate appropriately   Please contact the office if your symptoms worsen or you have concerns that you are not improving.   Thank you  for choosing [facility] for your healthcare, and for allowing [place] to partner with you on your healthcare journey. I am thankful to be able to provide care to you today.   Wyn Quaker FNP-C    [id number] COVID-19 is a respiratory infection that is caused by a virus called severe acute respiratory syndrome coronavirus 2 (SARS-CoV-2). The disease is also known as coronavirus disease or novel coronavirus. In some people, the virus may not cause any symptoms. In others, it may cause a serious infection. The infection can get worse quickly and can lead to complications, such as:  Pneumonia, or infection of the lungs.  Acute respiratory distress syndrome or ARDS. This is fluid build-up in the lungs.  Acute respiratory failure. This is a condition in which there is not enough oxygen passing from the lungs to the body.  Sepsis or septic shock. This is a serious bodily reaction to an infection.  Blood clotting problems.  Secondary infections due to bacteria or fungus. The virus that causes COVID-19 is contagious. This means that it can spread from person to person through droplets from coughs and sneezes (respiratory secretions). What are the causes? This illness is caused by a virus. You may catch the virus by:  Breathing in droplets from an infected person's cough or sneeze.  Touching something, like a table or a doorknob, that was exposed to the virus (contaminated) and then touching your mouth, nose, or eyes. What increases the risk? Risk for infection You are more likely to be infected with this virus if you:  Live in or travel to an area with a COVID-19 outbreak.  Come in contact with a sick person who recently traveled to an area with a COVID-19 outbreak.  Provide care for or live with a person who is infected with COVID-19. Risk for serious illness You are more likely to become seriously ill from the virus if you:  Are 30 years of age or older.  Have a long-term disease  that lowers your body's ability to fight infection (immunocompromised).  Live in a nursing home or long-term care facility.  Have a long-term (chronic) disease such as: ? Chronic lung disease, including chronic obstructive pulmonary disease or asthma ? Heart disease. ? Diabetes. ? Chronic kidney disease. ? Liver disease.  Are obese. What are the signs or symptoms? Symptoms of this condition can range from mild to severe. Symptoms may appear any time from 2 to 14 days after being exposed to the virus. They include:  A fever.  A cough.  Difficulty breathing.  Chills.  Muscle pains.  A sore throat.  Loss of taste or smell. Some people may also have stomach problems, such as nausea, vomiting, or diarrhea. Other people may not have any symptoms of COVID-19. How is this diagnosed? This condition may be diagnosed based on:  Your signs and symptoms, especially if: ? You  live in an area with a COVID-19 outbreak. ? You recently traveled to or from an area where the virus is common. ? You provide care for or live with a person who was diagnosed with COVID-19.  A physical exam.  Lab tests, which may include: ? A nasal swab to take a sample of fluid from your nose. ? A throat swab to take a sample of fluid from your throat. ? A sample of mucus from your lungs (sputum). ? Blood tests.  Imaging tests, which may include, X-rays, CT scan, or ultrasound. How is this treated? At present, there is no medicine to treat COVID-19. Medicines that treat other diseases are being used on a trial basis to see if they are effective against COVID-19. Your health care provider will talk with you about ways to treat your symptoms. For most people, the infection is mild and can be managed at home with rest, fluids, and over-the-counter medicines. Treatment for a serious infection usually takes places in a hospital intensive care unit (ICU). It may include one or more of the following treatments.  These treatments are given until your symptoms improve.  Receiving fluids and medicines through an IV.  Supplemental oxygen. Extra oxygen is given through a tube in the nose, a face mask, or a hood.  Positioning you to lie on your stomach (prone position). This makes it easier for oxygen to get into the lungs.  Continuous positive airway pressure (CPAP) or bi-level positive airway pressure (BPAP) machine. This treatment uses mild air pressure to keep the airways open. A tube that is connected to a motor delivers oxygen to the body.  Ventilator. This treatment moves air into and out of the lungs by using a tube that is placed in your windpipe.  Tracheostomy. This is a procedure to create a hole in the neck so that a breathing tube can be inserted.  Extracorporeal membrane oxygenation (ECMO). This procedure gives the lungs a chance to recover by taking over the functions of the heart and lungs. It supplies oxygen to the body and removes carbon dioxide. Follow these instructions at home: Lifestyle  If you are sick, stay home except to get medical care. Your health care provider will tell you how long to stay home. Call your health care provider before you go for medical care.  Rest at home as told by your health care provider.  Do not use any products that contain nicotine or tobacco, such as cigarettes, e-cigarettes, and chewing tobacco. If you need help quitting, ask your health care provider.  Return to your normal activities as told by your health care provider. Ask your health care provider what activities are safe for you. General instructions  Take over-the-counter and prescription medicines only as told by your health care provider.  Drink enough fluid to keep your urine pale yellow.  Keep all follow-up visits as told by your health care provider. This is important. How is this prevented?  There is no vaccine to help prevent COVID-19 infection. However, there are steps you can  take to protect yourself and others from this virus. To protect yourself:   Do not travel to areas where COVID-19 is a risk. The areas where COVID-19 is reported change often. To identify high-risk areas and travel restrictions, check the CDC travel website: FatFares.com.br  If you live in, or must travel to, an area where COVID-19 is a risk, take precautions to avoid infection. ? Stay away from people who are sick. ? Wash  your hands often with soap and water for 20 seconds. If soap and water are not available, use an alcohol-based hand sanitizer. ? Avoid touching your mouth, face, eyes, or nose. ? Avoid going out in public, follow guidance from your state and local health authorities. ? If you must go out in public, wear a cloth face covering or face mask. ? Disinfect objects and surfaces that are frequently touched every day. This may include:  Counters and tables.  Doorknobs and light switches.  Sinks and faucets.  Electronics, such as phones, remote controls, keyboards, computers, and tablets. To protect others: If you have symptoms of COVID-19, take steps to prevent the virus from spreading to others.  If you think you have a COVID-19 infection, contact your health care provider right away. Tell your health care team that you think you may have a COVID-19 infection.  Stay home. Leave your house only to seek medical care. Do not use public transport.  Do not travel while you are sick.  Wash your hands often with soap and water for 20 seconds. If soap and water are not available, use alcohol-based hand sanitizer.  Stay away from other members of your household. Let healthy household members care for children and pets, if possible. If you have to care for children or pets, wash your hands often and wear a mask. If possible, stay in your own room, separate from others. Use a different bathroom.  Make sure that all people in your household wash their hands well and  often.  Cough or sneeze into a tissue or your sleeve or elbow. Do not cough or sneeze into your hand or into the air.  Wear a cloth face covering or face mask. Where to find more information  Centers for Disease Control and Prevention: PurpleGadgets.be  World Health Organization: https://www.castaneda.info/ Contact a health care provider if:  You live in or have traveled to an area where COVID-19 is a risk and you have symptoms of the infection.  You have had contact with someone who has COVID-19 and you have symptoms of the infection. Get help right away if:  You have trouble breathing.  You have pain or pressure in your chest.  You have confusion.  You have bluish lips and fingernails.  You have difficulty waking from sleep.  You have symptoms that get worse. These symptoms may represent a serious problem that is an emergency. Do not wait to see if the symptoms will go away. Get medical help right away. Call your local emergency services (911 in the [place] Do not drive yourself to the hospital. Let the emergency medical personnel know if you think you have COVID-19. Summary  COVID-19 is a respiratory infection that is caused by a virus. It is also known as coronavirus disease or novel coronavirus. It can cause serious infections, such as pneumonia, acute respiratory distress syndrome, acute respiratory failure, or sepsis.  The virus that causes COVID-19 is contagious. This means that it can spread from person to person through droplets from coughs and sneezes.  You are more likely to develop a serious illness if you are 84 years of age or older, have a weak immunity, live in a nursing home, or have chronic disease.  There is no medicine to treat COVID-19. Your health care provider will talk with you about ways to treat your symptoms.  Take steps to protect yourself and others from infection. Wash your hands often and disinfect objects and  surfaces that are frequently touched  every day. Stay away from people who are sick and wear a mask if you are sick. This information is not intended to replace advice given to you by your health care provider. Make sure you discuss any questions you have with your health care provider. Document Released: 06/25/2018 Document Revised: 10/15/2018 Document Reviewed: 06/25/2018 Elsevier Patient Education  2020 [employer].   This information is directly available on the CDC website: RunningShows.co.za.html    Source:CDC Reference to specific commercial products, manufacturers, companies, or trademarks does not constitute its endorsement or recommendation by the [place], [place], or Centers for [employer] and Prevention.

## 2019-05-24 NOTE — Assessment & Plan Note (Addendum)
Plan: Continue flutter valve use Take Mucinex 600 mg daily  Hydrate well Augmentin today

## 2019-05-24 NOTE — Progress Notes (Signed)
  I connected by phone with Michele [name] on 05/24/2019 at 9:24 AM to discuss the potential use of an new treatment for mild to moderate COVID-19 viral infection in non-hospitalized patients.  This patient is a 63 y.o. female that meets the FDA criteria for Emergency Use Authorization of bamlanivimab or casirivimab\imdevimab.  Has a (+) direct SARS-CoV-2 viral test result  Has mild or moderate COVID-19   Is ? 65 years of age and weighs ? 40 kg  Is NOT hospitalized due to COVID-19  Is NOT requiring oxygen therapy or requiring an increase in baseline oxygen flow rate due to COVID-19  Is within 10 days of symptom onset  Has at least one of the high risk factor(s) for progression to severe COVID-19 and/or hospitalization as defined in EUA.  Specific high risk criteria : Hypertension   I have spoken and communicated the following to the patient or parent/caregiver:  1. FDA has authorized the emergency use of bamlanivimab and casirivimab\imdevimab for the treatment of mild to moderate COVID-19 in adults and pediatric patients with positive results of direct SARS-CoV-2 viral testing who are 40 years of age and older weighing at least 40 kg, and who are at high risk for progressing to severe COVID-19 and/or hospitalization.  2. The significant known and potential risks and benefits of bamlanivimab and casirivimab\imdevimab, and the extent to which such potential risks and benefits are unknown.  3. Information on available alternative treatments and the risks and benefits of those alternatives, including clinical trials.  4. Patients treated with bamlanivimab and casirivimab\imdevimab should continue to self-isolate and use infection control measures (e.g., wear mask, isolate, social distance, avoid sharing personal items, clean and disinfect "high touch" surfaces, and frequent handwashing) according to CDC guidelines.   5. The patient or parent/caregiver has the option to accept or refuse  bamlanivimab or casirivimab\imdevimab .  After reviewing this information with the patient, The patient agreed to proceed with receiving the bamlanimivab infusion and will be provided a copy of the Fact sheet prior to receiving the infusion.[name] 05/24/2019 9:24 AM

## 2019-05-24 NOTE — Assessment & Plan Note (Signed)
Plan: Continue Arnuity 100 Continue Spiriva HandiHaler We will need to reschedule pulmonary function testing Patient to follow-up with our office on 06/02/2019 as a telephonic visit to monitor symptoms Referred to [facility] infusion clinic to see if patient will qualify for monoclonal antibody infusion

## 2019-05-26 ENCOUNTER — Encounter (HOSPITAL_COMMUNITY): Payer: Self-pay

## 2019-05-26 ENCOUNTER — Ambulatory Visit (HOSPITAL_COMMUNITY)
Admission: RE | Admit: 2019-05-26 | Discharge: 2019-05-26 | Disposition: A | Discharge status: Home / Self Care | Payer: Medicare Other | Source: Ambulatory Visit | Attending: Pulmonary Disease | Admitting: Pulmonary Disease

## 2019-05-26 ENCOUNTER — Telehealth: Payer: Self-pay | Admitting: Pulmonary Disease

## 2019-05-26 DIAGNOSIS — U071 COVID-19: Secondary | ICD-10-CM | POA: Diagnosis not present

## 2019-05-26 DIAGNOSIS — J449 Chronic obstructive pulmonary disease, unspecified: Secondary | ICD-10-CM | POA: Diagnosis not present

## 2019-05-26 DIAGNOSIS — I25119 Atherosclerotic heart disease of native coronary artery with unspecified angina pectoris: Secondary | ICD-10-CM | POA: Diagnosis not present

## 2019-05-26 DIAGNOSIS — Z23 Encounter for immunization: Secondary | ICD-10-CM | POA: Diagnosis not present

## 2019-05-26 DIAGNOSIS — S51811D Laceration without foreign body of right forearm, subsequent encounter: Secondary | ICD-10-CM | POA: Diagnosis not present

## 2019-05-26 DIAGNOSIS — I252 Old myocardial infarction: Secondary | ICD-10-CM | POA: Diagnosis not present

## 2019-05-26 DIAGNOSIS — G40909 Epilepsy, unspecified, not intractable, without status epilepticus: Secondary | ICD-10-CM | POA: Diagnosis not present

## 2019-05-26 DIAGNOSIS — I10 Essential (primary) hypertension: Secondary | ICD-10-CM | POA: Diagnosis not present

## 2019-05-26 DIAGNOSIS — G43909 Migraine, unspecified, not intractable, without status migrainosus: Secondary | ICD-10-CM | POA: Diagnosis not present

## 2019-05-26 MED ORDER — SODIUM CHLORIDE 0.9 % IV SOLN
INTRAVENOUS | Status: DC | PRN
  Administered 2019-05-26: 13:00:00 250 mL via INTRAVENOUS

## 2019-05-26 MED ORDER — EPINEPHRINE 0.3 MG/0.3ML IJ SOAJ: 0.3000 mg | Freq: Once | INTRAMUSCULAR | Status: DC | PRN

## 2019-05-26 MED ORDER — FAMOTIDINE IN NACL 20-0.9 MG/50ML-% IV SOLN: 20.0000 mg | Freq: Once | INTRAVENOUS | Status: DC | PRN

## 2019-05-26 MED ORDER — METHYLPREDNISOLONE SODIUM SUCC 125 MG IJ SOLR: 125.0000 mg | Freq: Once | INTRAMUSCULAR | Status: DC | PRN

## 2019-05-26 MED ORDER — ALBUTEROL SULFATE HFA 108 (90 BASE) MCG/ACT IN AERS: 2.0000 | INHALATION_SPRAY | Freq: Once | RESPIRATORY_TRACT | Status: DC | PRN

## 2019-05-26 MED ORDER — SODIUM CHLORIDE 0.9 % IV SOLN
700.0000 mg | Freq: Once | INTRAVENOUS | Status: AC
  Administered 2019-05-26: 700 mg via INTRAVENOUS
  Filled 2019-05-26: qty 20

## 2019-05-26 MED ORDER — DIPHENHYDRAMINE HCL 50 MG/ML IJ SOLN: 50.0000 mg | Freq: Once | INTRAMUSCULAR | Status: DC | PRN

## 2019-05-26 NOTE — Telephone Encounter (Signed)
Called and spoke with pt letting her know to use her inhalers and she verbalized understanding. Nothing further needed.

## 2019-05-31 ENCOUNTER — Ambulatory Visit: Payer: Self-pay | Admitting: *Deleted

## 2019-05-31 NOTE — Telephone Encounter (Signed)
Patient has questions about retesting, ending isolation and side effects from IV infusion. Information given on retesting protocol with reasons behind, isolation protocols and safe practices. Answer Assessment - Initial Assessment Questions 1. REASON FOR CALL or QUESTION: "What is your reason for calling today?" or "How can I best help you?" or "What question do you have that I can help answer?"     COVID questions: retesting protocols, isolation protocols, safe practices.  Protocols used: INFORMATION ONLY CALL - NO TRIAGE-A-AH

## 2019-05-31 NOTE — Telephone Encounter (Signed)
Patient has previously spoken with NT and provided information.

## 2019-05-31 NOTE — Telephone Encounter (Signed)
  Reason for Disposition . Health Information question, no triage required and triager able to answer question  Answer Assessment - Initial Assessment Questions 1. REASON FOR CALL or QUESTION: "What is your reason for calling today?" or "How can I best help you?" or "What question do you have that I can help answer?"     COVID questions: retesting protocols, isolation protocols, safe practices.  Protocols used: INFORMATION ONLY CALL - NO TRIAGE-A-AH

## 2019-05-31 NOTE — Telephone Encounter (Signed)
Summary: covid    Patient is requesting a call back regarding covid test and how long she should wait to get another  Call back [phone]      Patient tested + COVID - 12/18. Patient was tested for symptoms. Patient infusion at [facility] 12/23 and feels her symptoms got worse. Patient given clinic number for questions.

## 2019-06-01 DIAGNOSIS — I252 Old myocardial infarction: Secondary | ICD-10-CM | POA: Diagnosis not present

## 2019-06-01 DIAGNOSIS — I25119 Atherosclerotic heart disease of native coronary artery with unspecified angina pectoris: Secondary | ICD-10-CM | POA: Diagnosis not present

## 2019-06-01 DIAGNOSIS — J449 Chronic obstructive pulmonary disease, unspecified: Secondary | ICD-10-CM | POA: Diagnosis not present

## 2019-06-01 DIAGNOSIS — G40909 Epilepsy, unspecified, not intractable, without status epilepticus: Secondary | ICD-10-CM | POA: Diagnosis not present

## 2019-06-01 DIAGNOSIS — S51811D Laceration without foreign body of right forearm, subsequent encounter: Secondary | ICD-10-CM | POA: Diagnosis not present

## 2019-06-01 DIAGNOSIS — G43909 Migraine, unspecified, not intractable, without status migrainosus: Secondary | ICD-10-CM | POA: Diagnosis not present

## 2019-06-01 NOTE — Progress Notes (Signed)
Virtual Visit via Telephone Note  I connected with Michele [name] on 06/02/19 at 12:00 PM EST by telephone and verified that I am speaking with the correct person using two identifiers.  Location: Patient: Home Provider: [facility] - [address], [place], [place], [place] [address]   I discussed the limitations, risks, security and privacy concerns of performing an evaluation and management service by telephone and the availability of in person appointments. I also discussed with the patient that there may be a patient responsible charge related to this service. The patient expressed understanding and agreed to proceed.  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  Smoker/ Smoking History: Former smoker.  Quit March/2019.  60-pack-year smoking history. Maintenance: Spiriva HandiHaler, Arnuity Ellipta Pt of: Dr. Halford Chessman  Chief complaint: Recent Covid+, s/p output infusion   63 year old female former smoker followed in our office for COPD emphysema.  Unfortunately patient recently tested positive for Covid.  She also received the outpatient infusion at [facility] status post her positive result.  She was also treated empirically with Augmentin due to her history of bronchiectasis, worsening cough and congestion in her repeated concerns that she may have pneumonia.  Patient completing telephone visit with our office today.  She reports adherence to her Spiriva and her Arnuity.  Patient reports her oxygen levels have been stable to currently 97% on room air.  She continues to be afebrile.  She reports that she was evaluated by her physical therapist yesterday who reported that she "heard something in her chest".  Patient reports that she continues to  have ongoing fatigue, cough, congestion.  Her cough is productive with white mucus.  Patient continues to have concerns that she may have pneumonia.  She is unsure why she is so fatigued.    Observations/Objective:  06/02/2019 - Spo2- 97 on RA  06/02/2019 - temp - 98.1 06/01/2019 - BP - 110/54 per pt   Pulmonary tests:  PFT 12/23/11 >> FEV1 2.13 (85%), FEV1% 59, TLC 5.98 (112%), DLCO 60% Spirometry 11/19/17 >> FEV1 2.58 (100%), FEV1% 76 FeNO 11/19/17 >> 5   Chest imaging:  CT chest 08/24/16 >> CAD, mod/severe emphysema CT chest 08/29/17 >> severe emphysema CT chest 03/02/18 >> severe centrilobular and paraseptal emphysema, 4 mm nodule RUL partially calcified CT angio chest 04/27/18 >> atherosclerosis, small/mod hiatal hernia, dilation of esophagus with mucosal thickening, severe emphysema with large bulla, bronchiectasis 06/12/2018-chest x-ray COPD and chronic interstitial lung disease, mild infiltrate noted anteriorly on the lateral view only, follow-up chest x-ray in 4 weeks following trial of antibiotic therapy 07/10/2018-chest x-ray- COPD without acute abnormality, previously seen density has resolved in the interval  Cardiac tests:  Echo 08/30/17 >> EF 60 to 65%, grade 1 DD, mild MR  Social History   Tobacco Use  Smoking Status Former Smoker  . Packs/day: 1.50  . Years: 40.00  . Pack years: 60.00  . Types: Cigarettes  . Quit date: 08/2017  . Years since quitting: 1.8  Smokeless Tobacco Never Used  Tobacco Comment   04/15/12 "I quit once for 2 1/2 years; smoking cessation counselor already here to visit"; done to less than 1/2 ppd (03/02/2013) - "1 pack per week" - 05/24/13 - ACTUALLY QUIT 08/2017   Immunization History  Administered  Date(s) Administered  . Influenza Split 03/03/2012, 02/19/2017  . Influenza Whole 03/21/2006, 03/06/2007, 04/01/2010  . Influenza,inj,Quad PF,6+ Mos 03/03/2014, 03/04/2016, 03/10/2018  . Influenza-Unspecified 02/07/2017  . Td 06/03/2008     Assessment and Plan:  COVID-19 virus detected Plan:  Schedule appt for patient in resp clinic for cxry and evaluation   Bronchiectasis without complication ([place]) S/p augmentin course  Plan: Continue flutter valve use Take Mucinex 600 mg daily  Hydrate well We will coordinate in person evaluation at [facility] respiratory clinic   COPD (chronic obstructive pulmonary disease) ([place]) Plan: Continue Arnuity 100 Continue Spiriva HandiHaler We will coordinate [facility] eval    Yeast infection Patient reports yeast infection Patient reports that she continues after every antibiotic course  Plan: Diflucan prescribed today Patient needs to follow-up with primary care symptoms persist  Discussion: We have attempted to schedule the patient with the the [facility] respiratory clinic for 06/02/2019 at 1830.  Unfortunately since patient was discharged from the [facility] primary care practice there is a block.  A ticket has been placed to get the patient scheduled.  Follow Up Instructions:  Return in about 6 weeks (around 07/14/2019), or if symptoms worsen or fail to improve, for Follow up with Dr. Halford Chessman.   I discussed the assessment and treatment plan with the patient. The patient was provided an opportunity to ask questions and all were answered. The patient agreed with the plan and demonstrated an understanding of the instructions.   The patient was advised to call back or seek an in-person evaluation if the symptoms worsen or if the condition fails to improve as anticipated.  I provided 35 minutes of non-face-to-face time during this encounter.   Lauraine Rinne, NP

## 2019-06-02 ENCOUNTER — Telehealth: Payer: Self-pay | Admitting: Pulmonary Disease

## 2019-06-02 ENCOUNTER — Telehealth: Payer: Self-pay | Admitting: Acute Care

## 2019-06-02 ENCOUNTER — Other Ambulatory Visit: Payer: Self-pay

## 2019-06-02 ENCOUNTER — Encounter: Payer: Self-pay | Admitting: Pulmonary Disease

## 2019-06-02 ENCOUNTER — Encounter (INDEPENDENT_AMBULATORY_CARE_PROVIDER_SITE_OTHER): Payer: Self-pay

## 2019-06-02 ENCOUNTER — Ambulatory Visit (INDEPENDENT_AMBULATORY_CARE_PROVIDER_SITE_OTHER): Payer: Medicare Other | Admitting: Pulmonary Disease

## 2019-06-02 DIAGNOSIS — J439 Emphysema, unspecified: Secondary | ICD-10-CM | POA: Diagnosis not present

## 2019-06-02 DIAGNOSIS — J479 Bronchiectasis, uncomplicated: Secondary | ICD-10-CM | POA: Diagnosis not present

## 2019-06-02 DIAGNOSIS — B373 Candidiasis of vulva and vagina: Secondary | ICD-10-CM

## 2019-06-02 DIAGNOSIS — R05 Cough: Secondary | ICD-10-CM

## 2019-06-02 DIAGNOSIS — B379 Candidiasis, unspecified: Secondary | ICD-10-CM | POA: Insufficient documentation

## 2019-06-02 DIAGNOSIS — U071 COVID-19: Secondary | ICD-10-CM | POA: Diagnosis not present

## 2019-06-02 MED ORDER — FLUCONAZOLE 150 MG PO TABS: ORAL_TABLET | ORAL | 0 refills | Status: DC

## 2019-06-02 NOTE — Assessment & Plan Note (Signed)
Plan:  Schedule appt for patient in resp clinic for cxry and evaluation

## 2019-06-02 NOTE — Assessment & Plan Note (Signed)
Patient reports yeast infection Patient reports that she continues after every antibiotic course  Plan: Diflucan prescribed today Patient needs to follow-up with primary care symptoms persist

## 2019-06-02 NOTE — Patient Instructions (Addendum)
You were seen today by Michele Rinne, NP  for:   1. Pulmonary emphysema, unspecified emphysema type ([place])  Continue Spiriva HandiHaler as prescribed Continue Arnuity Ellipta as prescribed  Continue rescue inhaler as prescribed  Note your daily symptoms > remember "red flags" for COPD:   >>>Increase in cough >>>increase in sputum production >>>increase in shortness of breath or activity  intolerance.   If you notice these symptoms, please call the office to be seen.   2. Bronchiectasis without complication (HCC)  Bronchiectasis: This is the medical term which indicates that you have damage, dilated airways making you more susceptible to respiratory infection. Use a flutter valve 10 breaths twice a day or 4 to 5 breaths 4-5 times a day to help clear mucus out Let us know if you have cough with change in mucus color or fevers or chills.  At that point you would need an antibiotic. Maintain a healthy nutritious diet, eating whole foods Take your medications as prescribed    3. COVID-19 virus detected  Status post outpatient infusion  Patient needs to continue to monitor her oxygen levels at home Continue to monitor her temperature MyChart home monitoring ordered today   4. Yeast infection  - fluconazole (DIFLUCAN) 150 MG tablet; Take 1 tablet right now, can repeat and take 1 tablet again in 72 hours if symptoms persist.  If symptoms persist follow-up with primary care.  Dispense: 2 tablet; Refill: 0  If symptoms persist you need to present to primary care for further evaluation  We recommend today:   Meds ordered this encounter  Medications  . fluconazole (DIFLUCAN) 150 MG tablet    Sig: Take 1 tablet right now, can repeat and take 1 tablet again in 72 hours if symptoms persist.  If symptoms persist follow-up with primary care.    Dispense:  2 tablet    Refill:  0    Follow Up:    Return in about 6 weeks (around 07/14/2019), or if symptoms worsen or fail to improve, for  Follow up with Dr. Halford Chessman.   Please do your part to reduce the spread of COVID-19:      Reduce your risk of any infection  and COVID19 by using the similar precautions used for avoiding the common cold or flu:  [name] Wash your hands often with soap and warm water for at least 20 seconds.  If soap and water are not readily available, use an alcohol-based hand sanitizer with at least 60% alcohol.  . If coughing or sneezing, cover your mouth and nose by coughing or sneezing into the elbow areas of your shirt or coat, into a tissue or into your sleeve (not your hands). [name] A MASK when in public  . Avoid shaking hands with others and consider head nods or verbal greetings only. . Avoid touching your eyes, nose, or mouth with unwashed hands.  . Avoid close contact with people who are sick. . Avoid places or events with large numbers of people in one location, like concerts or sporting events. . If you have some symptoms but not all symptoms, continue to monitor at home and seek medical attention if your symptoms worsen. . If you are having a medical emergency, call 911.   [place] / e-Visit: [email or web address]         [facility]: [facility]: [phone]  [facility]: [phone]     It is flu season:   >>> Best ways to protect herself from the flu: Receive the yearly flu vaccine, practice good hand hygiene washing with soap and also using hand sanitizer when available, eat a nutritious meals, get adequate rest, hydrate appropriately   Please contact the office if your symptoms worsen or you have concerns that you are not improving.   Thank you for choosing [facility] for your healthcare, and for allowing [place] to partner with you on your healthcare journey. I am thankful to be able to provide care to you  today.   Wyn Quaker FNP-C

## 2019-06-02 NOTE — Assessment & Plan Note (Addendum)
S/p augmentin course  Plan: Continue flutter valve use Take Mucinex 600 mg daily  Hydrate well We will coordinate in person evaluation at [facility] respiratory clinic

## 2019-06-02 NOTE — Assessment & Plan Note (Addendum)
Plan: Continue Arnuity 100 Continue Spiriva HandiHaler We will coordinate [facility] eval

## 2019-06-02 NOTE — Telephone Encounter (Signed)
Patient had a televisit today with Michele [name] today. He requested for her to be seen in the respiratory clinic tonight at 630pm. Attempted to make the appointment for patient but received a message stating that patient had been dismissed from [facility]. Due to the fact  the schedule is attached to [facility], her appointment could not be made.   Contacted Michele [name], site supervisor for the respiratory clinic. She created a ticket for the IT department to remove the block. So far the block has not been removed. She spoke with Michele [name] about this as well.   Spoke with patient. She is aware that she does not have an appointment tonight. Did advise her that Michele [name] is aware and we will work to see what we can do for her. She verbalized understanding.   Will keep this encounter open for follow up.

## 2019-06-02 NOTE — Telephone Encounter (Signed)
I received a call from [facility] to the after hours call line. She states she was diagnosed with Covid on 12/ 18.  She received an outpatient infusion of monoclonal antibody at [facility] after her positive result.  Additionally she was treated with Augmentin empirically due to her history of bronchiectasis and concern for pneumonia.  She called the pulmonary office today reporting adherence to her Spiriva and her Arnuity.  Her oxygen levels have been stable at 96 to 97% on room air.  She is afebrile.  She states she has had continued ongoing fatigue, cough, and congestion.  Per the patient she has a cough that is productive for white mucus.  The patient is concerned that she may have pneumonia. At that time she was scheduled for an appointment at the respiratory clinic for a chest x-ray and evaluation.   While the pulmonary office attempted to make the appointment for the patient, they received a message stating that the patient had been blocked from the practice.  Because of that the pulmonary office was unable to place the patient on the respiratory clinic schedule.  The pulmonary office attempted to call up the IT department to see what they could do to get the patient placed on the schedule for the respiratory clinic.  The pulmonary clinic was never able to place the patient on the respiratory clinic schedule Apparently the patient was called and told that she would not be able to get a chest x-ray at the respiratory clinic.  At that point the patient called the after hours on-call number to see what her options were. I did attempt to contact to the respiratory clinic to further research the reason that the patient would not be able to be seen tonight. I spoke with the patient, asking her several questions regarding her oxygen saturations, whether or not she had fever, if she had any respiratory distress.  At this point she stated that her saturations were 96% on room air, but that she did have some  shortness of breath and back pain and that this was the reason she felt she needed a chest x-ray.  She was stable, and she was able to speak to me in full sentences with no breathlessness. I agreed with the patient that she needed a chest x-ray, and gave her the option of presenting to the respiratory clinic tonight to see if they could work through the issues and still provide her with a chest x-ray tonight, or I told her she could call the pulmonary office at 9 AM to have a chest x-ray done there.  She is 12 days post Covid diagnosis, and should be fine to be seen in the pulmonary office.  I explained to her at that time that the office was only open until noon tomorrow at which point she explained to me that she could not make it tomorrow because she had to get her boyfriend to his doctor's appointment.  At that point I explained to her that her only other option , should she not she is to come to the pulmonary office tomorrow, was the emergency room, or waiting until Monday, January 4.  We both agreed that waiting until January 4 was not a good idea, and I encouraged her to present to the pulmonary office tomorrow 06/03/2019 for chest x-ray. I was subsequently able to get in touch with the respiratory clinic and they explained to me that the reason that she could not be seen was that they  did not have chest x-ray capability at the clinic tonight.  Tamera the RN I spoke with then called the patient and explained this to her as well.  She also gave the patient the option of presenting to the emergency department for chest x-ray, or the pulmonary office 1231. The patient was given several options for follow-up, I asked her to call the pulmonary office in the morning at 9 AM so that we can follow-up.. She verbalized understanding of the above and had no further questions at completion of the call.  She was told to seek emergency care should she worsen overnight

## 2019-06-03 NOTE — Telephone Encounter (Signed)
06/03/2019 0859  Patient currently is afebrile, oxygen levels are stable.  Patient is status post Covid.  I do believe the patient would be better served at the respiratory clinic as she is still within the 21-day window of testing positive for Covid based off our office policy.  In addition to this her boyfriend who she lives with is also currently having symptoms that are worsening and he was tested again for Covid yesterday.  I am unsure of those results.  I have sent a message to the administration team of the [facility] respiratory clinic to ensure that we have appropriate follow-up to get the patient seen.  We need to contact the patient today to let her know that we are working on getting her in with the respiratory clinic.  If she starts to worsen over the weekend then she will need to present to an urgent care or emergency room for further evaluation.  Preferably a site that has chest x-ray imaging.  She can present to an urgent care.  The [place] location may be the best option for the patient.  If the patient reports that she will present there we will need to contact the UC location ahead of time to give them a heads up the patient is status post Covid as well as her boyfriend who she lives with is currently symptomatic.  She will need to notify our office if she has to do this.  As well as get those results faxed to our office.  Our fax number is [phone].  Transportation for the patient continues to be a barrier.  She reported yesterday per the documented note by Eric Form, NP that she was unable to present to our office today for chest x-ray imaging.  I also am hesitant to have the patient present to our office today as she is currently living with somebody who was having acute and worsening symptoms and was just recently tested for Covid.  This would also be the same individual who is driving her to this appointment.  I believe the best option for the patient at this point in time is to  continue to monitor symptoms clinically given the fact that she is afebrile, is coughing up white mucus which is her baseline, has fatigue which is known side effect of Covid, oxygen levels are stable she checked this yesterday on room air, and was recently treated with Augmentin.  If patient continues to be symptomatic can consider getting into the [facility] respiratory clinic next week once the Account block is lifted.  There is an IT ticket for this.  If symptoms worsen in the meantime prior to the patient be able to be seen at the respiratory clinic then she can present to an urgent care or emergency room.  Wyn Quaker, FNP

## 2019-06-03 NOTE — Telephone Encounter (Signed)
I called and spoke with Waunita Schooner at  [facility] to give them a heads up that the patient may come over the weekend if her symptoms worsens and she becomes febrile. He verbalized understanding.

## 2019-06-03 NOTE — Telephone Encounter (Signed)
Please also ensure that we have contacted the [facility] urgent care location to notify the patient may be presenting to their office this weekend based off of worsened symptoms and due to not being able to get into the [facility] respiratory clinic.Their office needs to be notified to ensure they are wearing appropriate PPE.  Although the patient is 13 days status post Covid infection she is also currently living with a boyfriend who will likely be taking her to this office who is currently symptomatic and was just tested for Covid on 06/02/2019.Wyn Quaker, FNP

## 2019-06-03 NOTE — Telephone Encounter (Signed)
I called and spoke with the patient and made her aware of the recommendations. She verbalized understanding. I provided her with the [facility] urgent care address and phone number and advised her to go if she worsens over the weekend and becomes febrile. She states that she will. I also advised her that the block is being looked into so that we can try and get her in with the respiratory clinic.

## 2019-06-03 NOTE — Telephone Encounter (Signed)
Patient checking the status of the chest xray.  Patient phone number is [phone].

## 2019-06-04 DIAGNOSIS — I252 Old myocardial infarction: Secondary | ICD-10-CM | POA: Diagnosis not present

## 2019-06-04 DIAGNOSIS — S51811D Laceration without foreign body of right forearm, subsequent encounter: Secondary | ICD-10-CM | POA: Diagnosis not present

## 2019-06-04 DIAGNOSIS — Z7902 Long term (current) use of antithrombotics/antiplatelets: Secondary | ICD-10-CM | POA: Diagnosis not present

## 2019-06-04 DIAGNOSIS — Z85118 Personal history of other malignant neoplasm of bronchus and lung: Secondary | ICD-10-CM | POA: Diagnosis not present

## 2019-06-04 DIAGNOSIS — K449 Diaphragmatic hernia without obstruction or gangrene: Secondary | ICD-10-CM | POA: Diagnosis not present

## 2019-06-04 DIAGNOSIS — M8580 Other specified disorders of bone density and structure, unspecified site: Secondary | ICD-10-CM | POA: Diagnosis not present

## 2019-06-04 DIAGNOSIS — F329 Major depressive disorder, single episode, unspecified: Secondary | ICD-10-CM | POA: Diagnosis not present

## 2019-06-04 DIAGNOSIS — G43909 Migraine, unspecified, not intractable, without status migrainosus: Secondary | ICD-10-CM | POA: Diagnosis not present

## 2019-06-04 DIAGNOSIS — K219 Gastro-esophageal reflux disease without esophagitis: Secondary | ICD-10-CM | POA: Diagnosis not present

## 2019-06-04 DIAGNOSIS — D1723 Benign lipomatous neoplasm of skin and subcutaneous tissue of right leg: Secondary | ICD-10-CM | POA: Diagnosis not present

## 2019-06-04 DIAGNOSIS — Z87891 Personal history of nicotine dependence: Secondary | ICD-10-CM | POA: Diagnosis not present

## 2019-06-04 DIAGNOSIS — G40909 Epilepsy, unspecified, not intractable, without status epilepticus: Secondary | ICD-10-CM | POA: Diagnosis not present

## 2019-06-04 DIAGNOSIS — J449 Chronic obstructive pulmonary disease, unspecified: Secondary | ICD-10-CM | POA: Diagnosis not present

## 2019-06-04 DIAGNOSIS — M1990 Unspecified osteoarthritis, unspecified site: Secondary | ICD-10-CM | POA: Diagnosis not present

## 2019-06-04 DIAGNOSIS — N281 Cyst of kidney, acquired: Secondary | ICD-10-CM | POA: Diagnosis not present

## 2019-06-04 DIAGNOSIS — I25119 Atherosclerotic heart disease of native coronary artery with unspecified angina pectoris: Secondary | ICD-10-CM | POA: Diagnosis not present

## 2019-06-04 DIAGNOSIS — Z95818 Presence of other cardiac implants and grafts: Secondary | ICD-10-CM | POA: Diagnosis not present

## 2019-06-05 ENCOUNTER — Encounter (INDEPENDENT_AMBULATORY_CARE_PROVIDER_SITE_OTHER): Payer: Self-pay

## 2019-06-07 ENCOUNTER — Telehealth: Payer: Self-pay | Admitting: Pulmonary Disease

## 2019-06-07 NOTE — Progress Notes (Signed)
Reviewed and agree with assessment/plan.   Saud Bail, MD [facility] 05/29/2016, 12:24 PM Pager:  [phone]  

## 2019-06-07 NOTE — Telephone Encounter (Signed)
Attempted to call pt but unable to reach. Left message for pt to return call. 

## 2019-06-08 ENCOUNTER — Ambulatory Visit (INDEPENDENT_AMBULATORY_CARE_PROVIDER_SITE_OTHER): Payer: Medicare Other

## 2019-06-08 DIAGNOSIS — U071 COVID-19: Secondary | ICD-10-CM | POA: Diagnosis not present

## 2019-06-08 DIAGNOSIS — J439 Emphysema, unspecified: Secondary | ICD-10-CM | POA: Diagnosis not present

## 2019-06-08 DIAGNOSIS — R0600 Dyspnea, unspecified: Secondary | ICD-10-CM | POA: Diagnosis not present

## 2019-06-08 NOTE — Telephone Encounter (Signed)
Patient returned call to the office.  Patient was wanting to know about cxr. Per Judson Roch, NP  06/02/19, Patient has cxr ordered 06/02/19 and instructed by Judson Roch, NP to come to [place] 06/03/19. Patient stated her symptoms have improved since 06/02/19, and her boyfriend was retested for  Covid with negative results. Patient stated she did not go to ED, because she felt her symptoms were not  bad enough for ED treatment.Spoke with Jonelle Sidle, CMA, who spoke with Judson Roch, NP about cxr and covid clinic. Jonelle Sidle stated Judson Roch, NP stated Patient could come to [facility] for cxr.  Patient instructed per SG orders to come to office today or this week before 12 or after 2pm, for cxr. Understanding stated. Patient stated she could follow up after cxr results, if needed.  Nothing further at this time.

## 2019-06-09 ENCOUNTER — Telehealth: Payer: Self-pay | Admitting: Pulmonary Disease

## 2019-06-09 NOTE — Telephone Encounter (Signed)
Left message for patient to call back  

## 2019-06-09 NOTE — Telephone Encounter (Signed)
Message routed to app of the day,  Judson Roch, can you take a look at the chest xray performed 06/08/19 (at your request based on telephone note dated 06/07/19) and advise of results? Thank you.

## 2019-06-09 NOTE — Telephone Encounter (Signed)
Spoke with the pt  I advised her of these results  She states that she read the results on mychart and she still thinks there is something wrong "b/c it says something about the top part of my lungs"  She states that her lungs hurt when she lies down and her heart races  She wants to come in for ov  Refused televisit  Please advise thanks

## 2019-06-09 NOTE — Telephone Encounter (Signed)
Please let patient know the CXR shows her emphysema, but no pneumonia or other acute abnormalities. Thanks so much  No infiltrates or effusions. Hyperinflation of the lungs with emphysematous changes in both upper lobes, more severe on the right than the left.  IMPRESSION: 1. No acute abnormalities.

## 2019-06-09 NOTE — Telephone Encounter (Signed)
See if she will agree to a video visit . She needs a d-dimer which she can come in and get drawn today.Thanks

## 2019-06-10 ENCOUNTER — Other Ambulatory Visit: Payer: Self-pay | Admitting: *Deleted

## 2019-06-10 ENCOUNTER — Telehealth: Payer: Self-pay | Admitting: Pulmonary Disease

## 2019-06-10 DIAGNOSIS — G40909 Epilepsy, unspecified, not intractable, without status epilepticus: Secondary | ICD-10-CM | POA: Diagnosis not present

## 2019-06-10 DIAGNOSIS — I25119 Atherosclerotic heart disease of native coronary artery with unspecified angina pectoris: Secondary | ICD-10-CM | POA: Diagnosis not present

## 2019-06-10 DIAGNOSIS — G43909 Migraine, unspecified, not intractable, without status migrainosus: Secondary | ICD-10-CM | POA: Diagnosis not present

## 2019-06-10 DIAGNOSIS — I252 Old myocardial infarction: Secondary | ICD-10-CM | POA: Diagnosis not present

## 2019-06-10 DIAGNOSIS — J449 Chronic obstructive pulmonary disease, unspecified: Secondary | ICD-10-CM | POA: Diagnosis not present

## 2019-06-10 DIAGNOSIS — S51811D Laceration without foreign body of right forearm, subsequent encounter: Secondary | ICD-10-CM | POA: Diagnosis not present

## 2019-06-10 MED ORDER — LEVALBUTEROL TARTRATE 45 MCG/ACT IN AERO: 2.0000 | INHALATION_SPRAY | Freq: Four times a day (QID) | RESPIRATORY_TRACT | 5 refills | Status: DC | PRN

## 2019-06-10 NOTE — Telephone Encounter (Signed)
Pt has been scheduled for a visit and all has been taken care of. Nothing further needed.

## 2019-06-10 NOTE — Telephone Encounter (Signed)
Message sent to app of the day Rexene Edison, NP:  Tammy,   Please take a look at the 06/02/19 office visit note from Wyn Quaker, NP regarding this patient.   The inhaler she is requesting Levalbuterol inhaler was not issued by our office, based on the office note and her current medications would a refill of it be appropriate to sent?   Looks like Xopenex solution was sent to the pharmacy back on 05/24/19.

## 2019-06-10 NOTE — Progress Notes (Signed)
Virtual Visit via Telephone Note  I connected with Michele [name] on 06/11/19 at  1:30 PM EST by telephone and verified that I am speaking with the correct person using two identifiers.  Location: Patient: Home Provider: [facility] - [address], [place], [place], [place] [address]   I discussed the limitations, risks, security and privacy concerns of performing an evaluation and management service by telephone and the availability of in person appointments. I also discussed with the patient that there may be a patient responsible charge related to this service. The patient expressed understanding and agreed to proceed.  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  Smoker/ Smoking History: Former smoker.  Quit March/2019.  60-pack-year smoking history. Maintenance: Spiriva HandiHaler, Arnuity Ellipta Pt of: Dr. Halford Chessman  Chief complaint: Acute follow up   64 year old female former smoker followed in our office for COPD, bronchiectasis, status post COVID-19 infection in December/2020.  Patient is status post monoclonal antibody infusion, status post course of Augmentin.  Recent chest x-ray on 06/08/2019 shows no acute abnormalities.  Patient was quite relieved Informed her of that today.  She reports "I was concerned I had cancer".  Patient has a significant amount of anxiety regarding her overall health.  She still is unsure why she is so fatigued.  We will review this today as we know this is a an ongoing symptom of acute COVID-19 as well as long-term Covid.  Patient continues to have a productive cough with yellow mucus.  This is close to her baseline.  She does have known bronchiectasis.  She has occasional light blood streaks in her sputum.  She is  not using any sort of cough suppressants.  She is taking general Mucinex.  Patient is very concerned regarding her blood pressure.  She reports her blood pressure today is 110/70.  She feels that this is too low.  Unfortunately patient also feels resistant to taking her Imdur.  She has not followed up with cardiology or primary care regarding her anxiety or her concerns regarding her medications.   Patient reports that she continues to have ongoing fatigue, cough.  Blood pressure is stable as listed below.  Patient remains afebrile.  Patient requested a refill of her rescue inhaler this was sent in December/2020 and also sent again yesterday.  She has not yet contacted the pharmacy for an update regarding this.  She is now reporting that she also needs a nebulizer.   Observations/Objective:  06/11/2019 - BP - 110/70 06/11/2019 - temp - 97.8  05/21/2019-SARS-CoV-2-positive 05/26/2019-monoclonal antibody infusion  06/08/2019-chest x-ray-no acute abnormalities, aortic arthrosclerosis, emphysema  Pulmonary tests:  PFT 12/23/11 >> FEV1 2.13 (85%), FEV1% 59, TLC 5.98 (112%), DLCO 60% Spirometry 11/19/17 >> FEV1 2.58 (100%), FEV1% 76 FeNO 11/19/17 >> 5   Chest imaging:  CT chest 08/24/16 >> CAD, mod/severe emphysema CT chest 08/29/17 >> severe emphysema CT chest 03/02/18 >> severe centrilobular and paraseptal emphysema, 4 mm nodule RUL partially calcified CT angio chest 04/27/18 >> atherosclerosis, small/mod hiatal hernia, dilation of esophagus with mucosal thickening, severe emphysema with large bulla, bronchiectasis 06/12/2018-chest x-ray COPD and chronic interstitial lung disease, mild infiltrate noted anteriorly on the lateral view only, follow-up chest x-ray in 4 weeks following trial of antibiotic therapy 07/10/2018-chest x-ray- COPD  without acute abnormality, previously seen density has resolved in the interval  Cardiac tests:  Echo 08/30/17 >> EF 60 to 65%, grade 1 DD, mild MR  Social History    Tobacco Use  Smoking Status Former Smoker  . Packs/day: 1.50  . Years: 40.00  . Pack years: 60.00  . Types: Cigarettes  . Quit date: 08/2017  . Years since quitting: 1.8  Smokeless Tobacco Never Used  Tobacco Comment   04/15/12 "I quit once for 2 1/2 years; smoking cessation counselor already here to visit"; done to less than 1/2 ppd (03/02/2013) - "1 pack per week" - 05/24/13 - ACTUALLY QUIT 08/2017   Immunization History  Administered Date(s) Administered  . Influenza Split 03/03/2012, 02/19/2017  . Influenza Whole 03/21/2006, 03/06/2007, 04/01/2010  . Influenza,inj,Quad PF,6+ Mos 03/03/2014, 03/04/2016, 03/10/2018  . Influenza-Unspecified 02/07/2017  . Td 06/03/2008      Assessment and Plan:  Orthostatic hypotension Plan: Patient to continue to monitor her blood pressure Patient to schedule a follow-up with cardiology to further discuss her concerns regarding her blood pressure  Bronchiectasis without complication ([place]) S/p augmentin course Chest x-ray clear Patient at baseline with a productive cough with slightly yellow mucus  Plan: Continue flutter valve use Can use over-the-counter Delsym Can use Tessalon Perles Take Mucinex 600 mg daily  Hydrate well    COPD (chronic obstructive pulmonary disease) (HCC) Plan: Continue Arnuity 100 Continue Spiriva HandiHaler Follow-up with our office in 6 to 8 weeks  COVID-19 virus detected 06/08/2019 chest x-ray clear Patient is afebrile today Boyfriend who she lives with is negative for SARS-CoV-2 Blood pressure stable today Patient reports her oxygen levels have been stable  Discussion: Explained to patient that a lot of the symptoms that she is currently feeling are symptoms of her recent Covid infection.  In addition these are also symptoms that are likely seen in the setting of long Covid.  The symptoms may persist for 2 to 3 months.  Sometimes longer as patient already at baseline had chronic headaches, chronic  fatigue, chronic body aches and malaise.  Patient also already at baseline had bronchiectasis, COPD.  I do feel that based on the discussions with her today that she is at baseline with her cough productive with yellow mucus.  This is typical for her.  Chest x-ray is clear and she is afebrile.  I do not believe that we need to have another course of antibiotics.  Plan: Patient to continue to monitor symptoms Patient to monitor her temperatures daily Patient to monitor oxygen levels Patient to call our office with any concerns   Anxiety state Discussion: This continues to be the patient's largest barrier.  She has significant anxiety especially regarding her health.  She is hesitant to trust medical decision making from healthcare providers.  She is exceptionally anxious and concerned about developing cancer.  I have explained to her that she needs to trust healthcare providers as well as have an open dialogue of communication.  She needs to contact offices if she is having concerns regarding medications.  For instance today she is exceptionally concerned regarding her blood pressure as well as if she should take her blood pressure medication.  She has not yet attempted to follow-up with primary care or cardiology about this.  Chart review further shows the patient has been offered chronic medications to help with management of anxiety such as SSRIs and SNRIs and she has declined those and prefers to continue to use benzodiazepines.  These are not  the best options for the patient.  I agree with the previous medical recommendations that an SSRI or an SNRI would be a better option for the patient.  I believe this would also help with allowing the patient to further manage her chronic health problems.  Plan: I emphasized the patient needs to follow-up with primary care Patient needs to follow-up with cardiology regarding her questions and concerns regarding the medications that they are  managing  Medication management Plan: Patient can use over-the-counter cough medicine such as Delsym  Patient needs to follow-up with cardiology and primary care regarding her plethora of questions regarding her medications that are managed by them  I do not believe patient needs additional antibiotics at this time  We will order a nebulizer for the patient   Follow Up Instructions:  Return in about 2 months (around 08/09/2019), or if symptoms worsen or fail to improve, for Follow up with Dr. Halford Chessman.   I discussed the assessment and treatment plan with the patient. The patient was provided an opportunity to ask questions and all were answered. The patient agreed with the plan and demonstrated an understanding of the instructions.   The patient was advised to call back or seek an in-person evaluation if the symptoms worsen or if the condition fails to improve as anticipated.  I provided 42 minutes of non-face-to-face time during this encounter.   Lauraine Rinne, NP

## 2019-06-10 NOTE — Telephone Encounter (Signed)
xoepnex inhaler has been filled today by Warner Mccreedy NP , is there anything else that she needs?  Make sure she has a follow up visit as she has multiple telephone messages with Warner Mccreedy NP/Groce NP   Please contact office for sooner follow up if symptoms do not improve or worsen or seek emergency care

## 2019-06-11 ENCOUNTER — Encounter: Payer: Self-pay | Admitting: Pulmonary Disease

## 2019-06-11 ENCOUNTER — Ambulatory Visit (INDEPENDENT_AMBULATORY_CARE_PROVIDER_SITE_OTHER): Payer: Medicare Other | Admitting: Pulmonary Disease

## 2019-06-11 ENCOUNTER — Telehealth: Payer: Self-pay | Admitting: Cardiology

## 2019-06-11 DIAGNOSIS — Z79899 Other long term (current) drug therapy: Secondary | ICD-10-CM

## 2019-06-11 DIAGNOSIS — J439 Emphysema, unspecified: Secondary | ICD-10-CM | POA: Diagnosis not present

## 2019-06-11 DIAGNOSIS — U071 COVID-19: Secondary | ICD-10-CM

## 2019-06-11 DIAGNOSIS — F411 Generalized anxiety disorder: Secondary | ICD-10-CM

## 2019-06-11 DIAGNOSIS — J479 Bronchiectasis, uncomplicated: Secondary | ICD-10-CM | POA: Diagnosis not present

## 2019-06-11 DIAGNOSIS — I951 Orthostatic hypotension: Secondary | ICD-10-CM | POA: Diagnosis not present

## 2019-06-11 MED ORDER — BENZONATATE 200 MG PO CAPS: 200.0000 mg | ORAL_CAPSULE | Freq: Three times a day (TID) | ORAL | 1 refills | Status: DC | PRN

## 2019-06-11 NOTE — Assessment & Plan Note (Signed)
Plan: Continue Arnuity 100 Continue Spiriva HandiHaler Follow-up with our office in 6 to 8 weeks

## 2019-06-11 NOTE — Assessment & Plan Note (Signed)
Plan: Patient to continue to monitor her blood pressure Patient to schedule a follow-up with cardiology to further discuss her concerns regarding her blood pressure

## 2019-06-11 NOTE — Assessment & Plan Note (Signed)
Plan: Patient can use over-the-counter cough medicine such as Delsym  Patient needs to follow-up with cardiology and primary care regarding her plethora of questions regarding her medications that are managed by them  I do not believe patient needs additional antibiotics at this time  We will order a nebulizer for the patient

## 2019-06-11 NOTE — Patient Instructions (Addendum)
You were seen today by Lauraine Rinne, NP  for:   1. Medication management  Contact cardiology regarding your concerns regarding the medications that they're managing for you  Schedule an appointment with cardiology and primary care for in person evaluation  2. COVID-19 virus detected  - benzonatate (TESSALON) 200 MG capsule; Take 1 capsule (200 mg total) by mouth 3 (three) times daily as needed for cough.  Dispense: 30 capsule; Refill: 1  Can use Tessalon Perles as needed for help with management of your cough  Can use over-the-counter cough medicine such as Delsym   3. Pulmonary emphysema, unspecified emphysema type ([place])  Continue Arnuity  Continue Spiriva HandiHaler  We will order a new nebulizer for you  4. Bronchiectasis without complication ([place])  Continue to hydrate well  Continue continue to use your flutter valve  Continue use Mucinex  5. Orthostatic hypotension  Please contact primary care and cardiology and set up appointments for follow-up You can discuss your concerns with your medications with them  Please bring your blood pressure cuff with you to those appointments to ensure you are getting accurate readings  6. Anxiety state  Please seriously consider discussing your anxiety with primary care.  I believe you would benefit from a long-term anxiety medication such as SSRI or SNRI drug class.  Looks like these were also recommended to you by cardiology.   We recommend today:   Meds ordered this encounter  Medications  . benzonatate (TESSALON) 200 MG capsule    Sig: Take 1 capsule (200 mg total) by mouth 3 (three) times daily as needed for cough.    Dispense:  30 capsule    Refill:  1   You can also start over-the-counter cough medicine such as Delsym  Follow Up:    Return in about 2 months (around 08/09/2019), or if symptoms worsen or fail to improve, for Follow up with Dr. Halford Chessman.   Please do your part to reduce the spread of  COVID-19:      Reduce your risk of any infection  and COVID19 by using the similar precautions used for avoiding the common cold or flu:  [name] Wash your hands often with soap and warm water for at least 20 seconds.  If soap and water are not readily available, use an alcohol-based hand sanitizer with at least 60% alcohol.  . If coughing or sneezing, cover your mouth and nose by coughing or sneezing into the elbow areas of your shirt or coat, into a tissue or into your sleeve (not your hands). [name] A MASK when in public  . Avoid shaking hands with others and consider head nods or verbal greetings only. . Avoid touching your eyes, nose, or mouth with unwashed hands.  . Avoid close contact with people who are sick. . Avoid places or events with large numbers of people in one location, like concerts or sporting events. . If you have some symptoms but not all symptoms, continue to monitor at home and seek medical attention if your symptoms worsen. . If you are having a medical emergency, call 911.   [place] / e-Visit: [email or web address]         [facility]: [phone]  [facility]: [phone]                   [facility]: [phone]     It is flu season:   >>> Best  ways to protect herself from the flu: Receive the yearly flu vaccine, practice good hand hygiene washing with soap and also using hand sanitizer when available, eat a nutritious meals, get adequate rest, hydrate appropriately   Please contact the office if your symptoms worsen or you have concerns that you are not improving.   Thank you for choosing [facility] for your healthcare, and for allowing [place] to partner with you on your healthcare journey. I am thankful to be able to provide care to you today.   Wyn Quaker FNP-C

## 2019-06-11 NOTE — Assessment & Plan Note (Addendum)
06/08/2019 chest x-ray clear Patient is afebrile today Boyfriend who she lives with is negative for SARS-CoV-2 Blood pressure stable today Patient reports her oxygen levels have been stable  Discussion: Explained to patient that a lot of the symptoms that she is currently feeling are symptoms of her recent Covid infection.  In addition these are also symptoms that are likely seen in the setting of long Covid.  The symptoms may persist for 2 to 3 months.  Sometimes longer as patient already at baseline had chronic headaches, chronic fatigue, chronic body aches and malaise.  Patient also already at baseline had bronchiectasis, COPD.  I do feel that based on the discussions with her today that she is at baseline with her cough productive with yellow mucus.  This is typical for her.  Chest x-ray is clear and she is afebrile.  I do not believe that we need to have another course of antibiotics.  Plan: Patient to continue to monitor symptoms Patient to monitor her temperatures daily Patient to monitor oxygen levels Patient to call our office with any concerns

## 2019-06-11 NOTE — Assessment & Plan Note (Signed)
S/p augmentin course Chest x-ray clear Patient at baseline with a productive cough with slightly yellow mucus  Plan: Continue flutter valve use Can use over-the-counter Delsym Can use Tessalon Perles Take Mucinex 600 mg daily  Hydrate well

## 2019-06-11 NOTE — Assessment & Plan Note (Signed)
Discussion: This continues to be the patient's largest barrier.  She has significant anxiety especially regarding her health.  She is hesitant to trust medical decision making from healthcare providers.  She is exceptionally anxious and concerned about developing cancer.  I have explained to her that she needs to trust healthcare providers as well as have an open dialogue of communication.  She needs to contact offices if she is having concerns regarding medications.  For instance today she is exceptionally concerned regarding her blood pressure as well as if she should take her blood pressure medication.  She has not yet attempted to follow-up with primary care or cardiology about this.  Chart review further shows the patient has been offered chronic medications to help with management of anxiety such as SSRIs and SNRIs and she has declined those and prefers to continue to use benzodiazepines.  These are not the best options for the patient.  I agree with the previous medical recommendations that an SSRI or an SNRI would be a better option for the patient.  I believe this would also help with allowing the patient to further manage her chronic health problems.  Plan: I emphasized the patient needs to follow-up with primary care Patient needs to follow-up with cardiology regarding her questions and concerns regarding the medications that they are managing

## 2019-06-11 NOTE — Telephone Encounter (Signed)
Lm to call back ./cy 

## 2019-06-11 NOTE — Telephone Encounter (Signed)
  Patient would like to speak to the nurse in regards to what her parameters for her BP readings should be.

## 2019-06-11 NOTE — Progress Notes (Signed)
Reviewed and agree with assessment/plan.   Princes Finger, MD [facility] 05/29/2016, 12:24 PM Pager:  [phone]  

## 2019-06-12 ENCOUNTER — Other Ambulatory Visit: Payer: Self-pay | Admitting: Cardiology

## 2019-06-14 ENCOUNTER — Telehealth: Payer: Self-pay | Admitting: Cardiology

## 2019-06-14 ENCOUNTER — Telehealth: Payer: Self-pay | Admitting: Pulmonary Disease

## 2019-06-14 NOTE — Telephone Encounter (Signed)
LMTCB x2 for pt 

## 2019-06-14 NOTE — Addendum Note (Signed)
Addended by: Valerie Salts on: 06/14/2019 09:25 AM   Modules accepted: Orders

## 2019-06-14 NOTE — Telephone Encounter (Signed)
Rx has been sent to the pharmacy electronically. ° °

## 2019-06-14 NOTE — Telephone Encounter (Signed)
I called and spoke with the patient and she states that the Xopenex is $57 without a PA through [facility]. I advised her that I will start a PA and we will let her know when we get an answer from the PA. It will be done via Cover my meds.

## 2019-06-14 NOTE — Telephone Encounter (Signed)
*  STAT* If patient is at the pharmacy, call can be transferred to refill team.   1. Which medications need to be refilled? (please list name of each medication and dose if known) VASCEPA 1 g CAPS  2. Which pharmacy/location (including street and city if local pharmacy) is medication to be sent to? [facility] [place], [place]  3. Do they need a 30 day or 90 day supply? 90 day

## 2019-06-15 ENCOUNTER — Other Ambulatory Visit (INDEPENDENT_AMBULATORY_CARE_PROVIDER_SITE_OTHER): Payer: Medicare Other

## 2019-06-15 ENCOUNTER — Telehealth: Payer: Self-pay | Admitting: Pulmonary Disease

## 2019-06-15 DIAGNOSIS — R0602 Shortness of breath: Secondary | ICD-10-CM | POA: Diagnosis not present

## 2019-06-15 LAB — CBC WITH DIFFERENTIAL/PLATELET
Basophils Absolute: 0 10*3/uL (ref 0.0–0.1)
Basophils Relative: 0.4 % (ref 0.0–3.0)
Eosinophils Absolute: 0 10*3/uL (ref 0.0–0.7)
Eosinophils Relative: 0.8 % (ref 0.0–5.0)
HCT: 42.8 % (ref 36.0–46.0)
Hemoglobin: 14.3 g/dL (ref 12.0–15.0)
Lymphocytes Relative: 10.7 % — ABNORMAL LOW (ref 12.0–46.0)
Lymphs Abs: 0.5 10*3/uL — ABNORMAL LOW (ref 0.7–4.0)
MCHC: 33.5 g/dL (ref 30.0–36.0)
MCV: 92.6 fl (ref 78.0–100.0)
Monocytes Absolute: 0.3 10*3/uL (ref 0.1–1.0)
Monocytes Relative: 6 % (ref 3.0–12.0)
Neutro Abs: 4.1 10*3/uL (ref 1.4–7.7)
Neutrophils Relative %: 82.1 % — ABNORMAL HIGH (ref 43.0–77.0)
Platelets: 177 10*3/uL (ref 150.0–400.0)
RBC: 4.62 Mil/uL (ref 3.87–5.11)
RDW: 13.5 % (ref 11.5–15.5)
WBC: 5 10*3/uL (ref 4.0–10.5)

## 2019-06-15 LAB — COMPREHENSIVE METABOLIC PANEL
ALT: 12 U/L (ref 0–35)
AST: 16 U/L (ref 0–37)
Albumin: 4.5 g/dL (ref 3.5–5.2)
Alkaline Phosphatase: 60 U/L (ref 39–117)
BUN: 12 mg/dL (ref 6–23)
CO2: 32 mEq/L (ref 19–32)
Calcium: 10.2 mg/dL (ref 8.4–10.5)
Chloride: 102 mEq/L (ref 96–112)
Creatinine, Ser: 0.76 mg/dL (ref 0.40–1.20)
GFR: 76.32 mL/min (ref >60.00)
Glucose, Bld: 159 mg/dL — ABNORMAL HIGH (ref 70–99)
Potassium: 4 mEq/L (ref 3.5–5.1)
Sodium: 140 mEq/L (ref 135–145)
Total Bilirubin: 0.4 mg/dL (ref 0.2–1.2)
Total Protein: 6.9 g/dL (ref 6.0–8.3)

## 2019-06-15 LAB — D-DIMER, QUANTITATIVE: D-Dimer, Quant: 0.46 mcg/mL FEU (ref <0.50)

## 2019-06-15 NOTE — Telephone Encounter (Signed)
Spoke with pt. She had a televisit with [name] on 06/10/2018. Nothing further was needed.

## 2019-06-15 NOTE — Telephone Encounter (Signed)
06/15/2019 1116  Received transfer telephone call from triage team.  Patient was confused regarding her work-up.  Patient does have an open order for a D-dimer.  Discussed this with the patient.  Patient reporting that she would like to come in today to receive lab testing.  I have placed a new order for a D-dimer as well as baseline lab work.  Patient is also reporting she is having increased orthopnea.  Patient reporting palpitations.  Patient reporting chest wall pain.  Patient reporting kidney pain.  Patient continuing to have a persistent cough.  Patient is status post Covid diagnosis 3 weeks ago.  I have requested the patient to follow-up with primary care regarding her kidney pain.  She reports that she understands this.  Emphasized again to the patient that she needs to have follow-up with primary care as well as cardiology as referenced on 06/11/2019 telephone visit.  Patient given the hours of our office today.  Explained to come in anytime between 2 PM and 5 PM to receive lab work.  Patient reports understanding.  Will route to Dr. Halford Chessman and Eric Form NP as Juluis Rainier.   Nothing further needed at this time.  Will await results of labs.  Wyn Quaker, FNP

## 2019-06-15 NOTE — Telephone Encounter (Signed)
Reviewed and agree.

## 2019-06-15 NOTE — Telephone Encounter (Signed)
Spoke with the pt  She states that she was called today and someone told her to come in and have labs done  She states she was unsure of who she spoke with or what labs needed to be done  Pt was transferred to [facility] for further discussion

## 2019-06-15 NOTE — Telephone Encounter (Signed)
Blood pressure range is fine if it goes anywhere from 110/60 - 150/90.  She feels her heart pounding because she has PVCs and PACs every now and then.  There are not enough for me to treat them with beta-blocker because every time we try to treat her blood pressure drop.  If her blood pressure is staying in the range that listed above, then she can use midodrine as needed only for low blood pressures.  Glenetta Hew, MD

## 2019-06-15 NOTE — Telephone Encounter (Signed)
Spoke with pt for lengthy time re h/a elevated heart rate (pounding) 80-90 and B/p 140/97 not sure when this reading was from Pt has stopped Midodrine about a week ago due to above symptoms Instructed to continue to monitor B/P and HR and to call back with update if no improvement Will forward to Dr [name] for review .Adonis Housekeeper

## 2019-06-16 LAB — PRO B NATRIURETIC PEPTIDE: NT-Pro BNP: 95 pg/mL (ref 0–301)

## 2019-06-16 NOTE — Telephone Encounter (Signed)
Approvedon January 12 PA Case: [id number], Status: Approved, Coverage Starts on: 06/04/2019 12:00:00 AM, Coverage Ends on: 06/02/2020 12:00:00 AM. Questions? Contact [phone].  Spoke with patient let her know medication was approved.  Nothing further needed at this time.

## 2019-06-16 NOTE — Progress Notes (Signed)
Rest of patient's lab work is stable.  Patient needs to follow-up with Dr. Kenton Kingfisher regarding her kidney/back pain.Wyn Quaker, FNP

## 2019-06-16 NOTE — Telephone Encounter (Signed)
The patient called the office 06/09/2019 with similar symptoms. I asked Triage to offer a video visit and order a d dimer. The patient was called several  times to schedule,  messages were left, and she did not call back until 06/11/2019.

## 2019-06-16 NOTE — Telephone Encounter (Signed)
Agreed, I spoke with the patient multiple times about this that the multiple messages as well as calls into our office can sometimes make it difficult to get an exact follow-up with the plan.  Patient has completed the lab work.  Negative D-dimer.  This is good news.  Patient encouraged to follow-up with primary care regarding kidney pain.Wyn Quaker, FNP

## 2019-06-17 DIAGNOSIS — S51811D Laceration without foreign body of right forearm, subsequent encounter: Secondary | ICD-10-CM | POA: Diagnosis not present

## 2019-06-17 DIAGNOSIS — J449 Chronic obstructive pulmonary disease, unspecified: Secondary | ICD-10-CM | POA: Diagnosis not present

## 2019-06-17 DIAGNOSIS — I252 Old myocardial infarction: Secondary | ICD-10-CM | POA: Diagnosis not present

## 2019-06-17 DIAGNOSIS — G40909 Epilepsy, unspecified, not intractable, without status epilepticus: Secondary | ICD-10-CM | POA: Diagnosis not present

## 2019-06-17 DIAGNOSIS — I25119 Atherosclerotic heart disease of native coronary artery with unspecified angina pectoris: Secondary | ICD-10-CM | POA: Diagnosis not present

## 2019-06-17 DIAGNOSIS — G43909 Migraine, unspecified, not intractable, without status migrainosus: Secondary | ICD-10-CM | POA: Diagnosis not present

## 2019-06-18 DIAGNOSIS — C3490 Malignant neoplasm of unspecified part of unspecified bronchus or lung: Secondary | ICD-10-CM | POA: Diagnosis not present

## 2019-06-18 DIAGNOSIS — E7800 Pure hypercholesterolemia, unspecified: Secondary | ICD-10-CM | POA: Diagnosis not present

## 2019-06-18 DIAGNOSIS — J439 Emphysema, unspecified: Secondary | ICD-10-CM | POA: Diagnosis not present

## 2019-06-18 DIAGNOSIS — J441 Chronic obstructive pulmonary disease with (acute) exacerbation: Secondary | ICD-10-CM | POA: Diagnosis not present

## 2019-06-18 DIAGNOSIS — E785 Hyperlipidemia, unspecified: Secondary | ICD-10-CM | POA: Diagnosis not present

## 2019-06-18 DIAGNOSIS — C3491 Malignant neoplasm of unspecified part of right bronchus or lung: Secondary | ICD-10-CM | POA: Diagnosis not present

## 2019-06-18 DIAGNOSIS — J449 Chronic obstructive pulmonary disease, unspecified: Secondary | ICD-10-CM | POA: Diagnosis not present

## 2019-06-18 DIAGNOSIS — I2510 Atherosclerotic heart disease of native coronary artery without angina pectoris: Secondary | ICD-10-CM | POA: Diagnosis not present

## 2019-06-18 NOTE — Telephone Encounter (Signed)
Left detailed message that is below  On voicemail for patient.

## 2019-06-20 IMAGING — DX DG CHEST 2V
2 series · 2 of 2 positions shown · non-contrast
Comparison: 01/19/2017

CLINICAL DATA: Cough and congestion for 2 weeks

EXAM:
CHEST  2 VIEW

[chest pa]
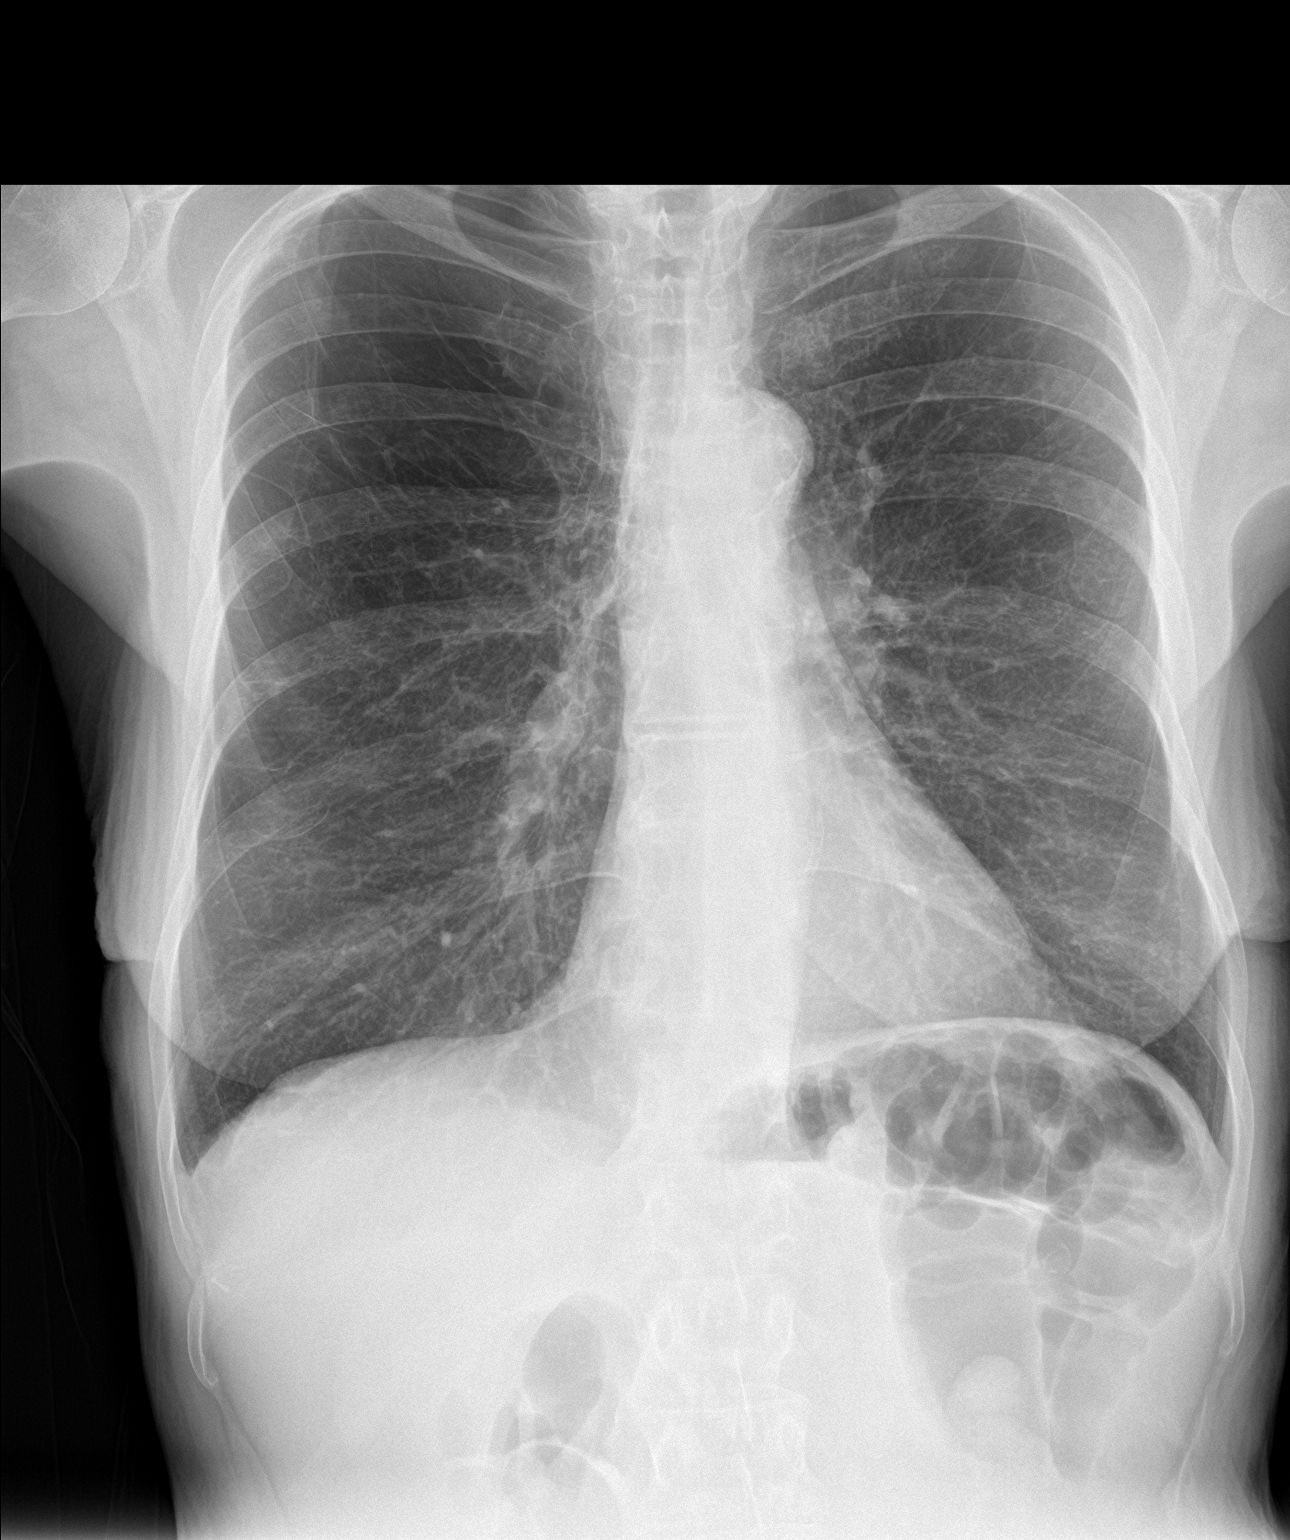

[chest lat]
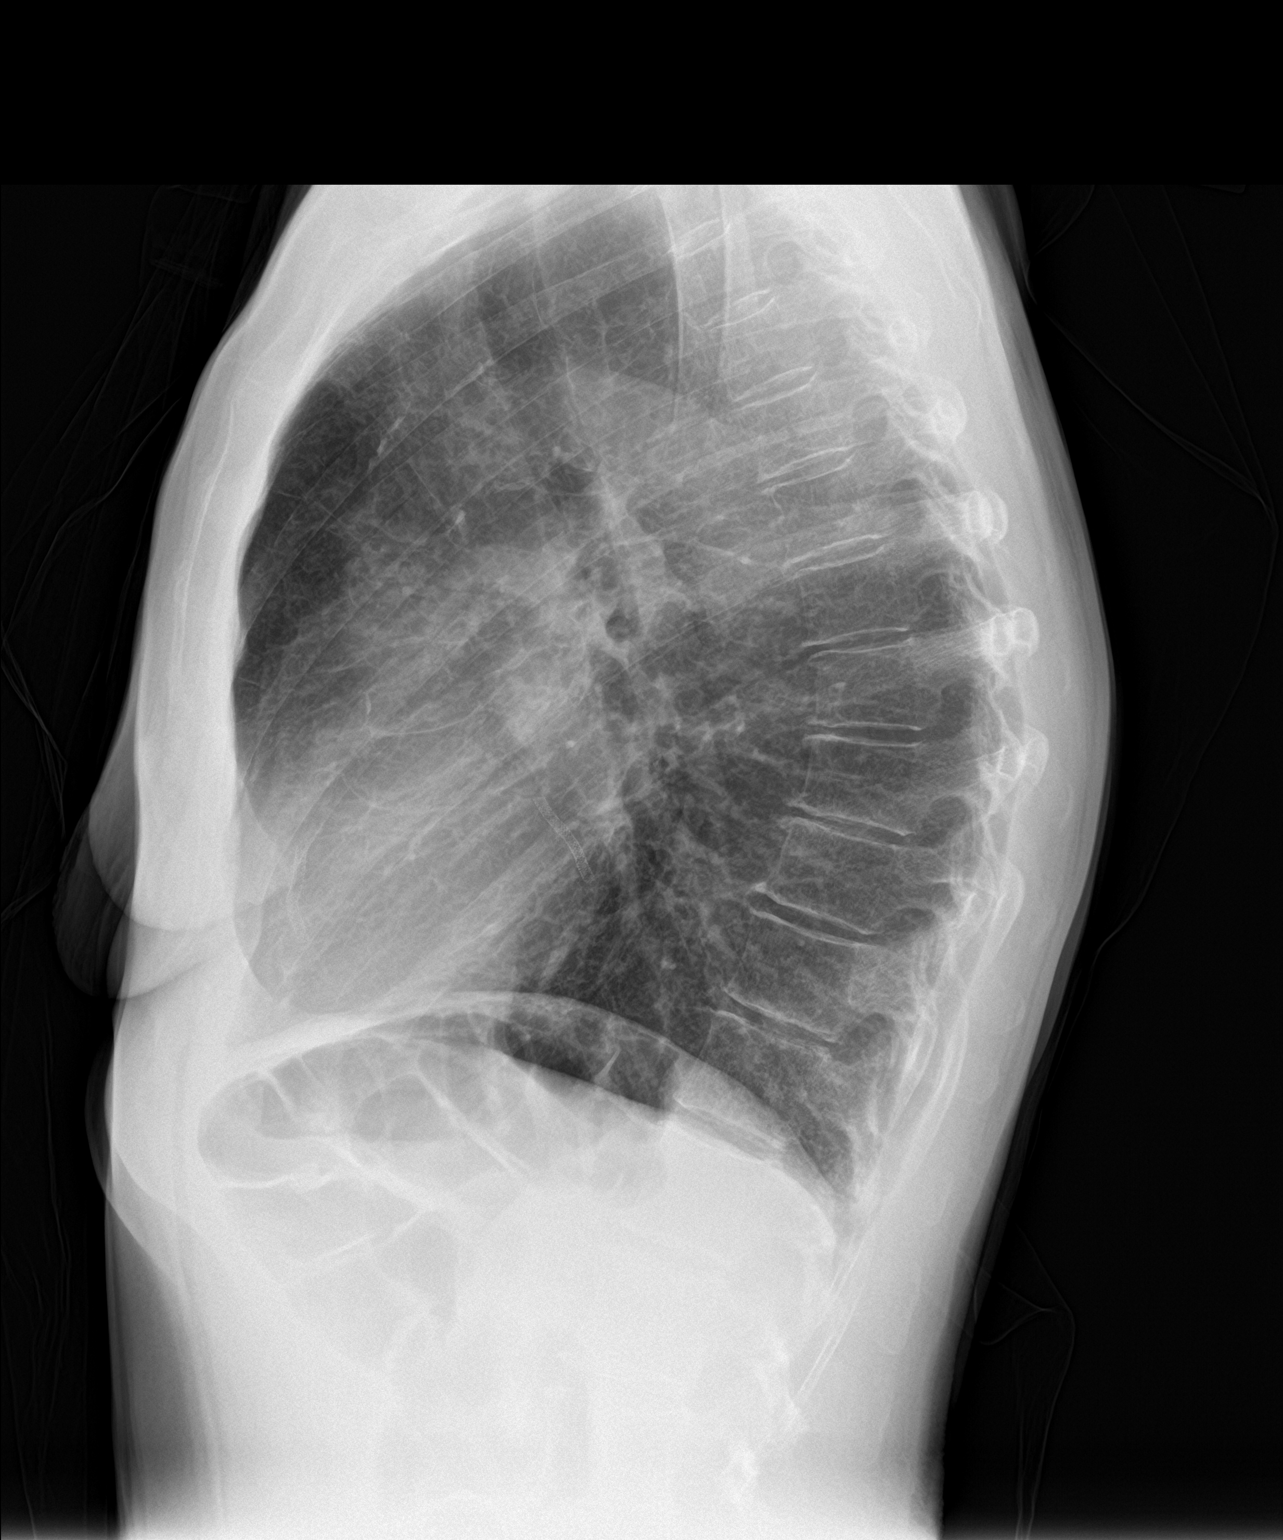

[2 of 2 positions shown; findings below may reference images not displayed]

FINDINGS: Cardiac shadow is within normal limits. Aortic calcifications are
again seen. Emphysematous changes are noted without focal infiltrate
or sizable effusion. The overall appearance is stable. Degenerative
changes of the thoracic spine are noted.
IMPRESSION: No acute abnormality noted.

Aortic Atherosclerosis (35SUQ-TB7.7) and Emphysema (35SUQ-1LI.1).

## 2019-06-21 DIAGNOSIS — R7303 Prediabetes: Secondary | ICD-10-CM | POA: Diagnosis not present

## 2019-06-21 DIAGNOSIS — R109 Unspecified abdominal pain: Secondary | ICD-10-CM | POA: Diagnosis not present

## 2019-06-21 DIAGNOSIS — R591 Generalized enlarged lymph nodes: Secondary | ICD-10-CM | POA: Diagnosis not present

## 2019-06-21 DIAGNOSIS — R350 Frequency of micturition: Secondary | ICD-10-CM | POA: Diagnosis not present

## 2019-06-23 DIAGNOSIS — G43909 Migraine, unspecified, not intractable, without status migrainosus: Secondary | ICD-10-CM | POA: Diagnosis not present

## 2019-06-23 DIAGNOSIS — I252 Old myocardial infarction: Secondary | ICD-10-CM | POA: Diagnosis not present

## 2019-06-23 DIAGNOSIS — I25119 Atherosclerotic heart disease of native coronary artery with unspecified angina pectoris: Secondary | ICD-10-CM | POA: Diagnosis not present

## 2019-06-23 DIAGNOSIS — G40909 Epilepsy, unspecified, not intractable, without status epilepticus: Secondary | ICD-10-CM | POA: Diagnosis not present

## 2019-06-23 DIAGNOSIS — S51811D Laceration without foreign body of right forearm, subsequent encounter: Secondary | ICD-10-CM | POA: Diagnosis not present

## 2019-06-23 DIAGNOSIS — J449 Chronic obstructive pulmonary disease, unspecified: Secondary | ICD-10-CM | POA: Diagnosis not present

## 2019-06-24 DIAGNOSIS — G43909 Migraine, unspecified, not intractable, without status migrainosus: Secondary | ICD-10-CM | POA: Diagnosis not present

## 2019-06-24 DIAGNOSIS — I252 Old myocardial infarction: Secondary | ICD-10-CM | POA: Diagnosis not present

## 2019-06-24 DIAGNOSIS — J449 Chronic obstructive pulmonary disease, unspecified: Secondary | ICD-10-CM | POA: Diagnosis not present

## 2019-06-24 DIAGNOSIS — G40909 Epilepsy, unspecified, not intractable, without status epilepticus: Secondary | ICD-10-CM | POA: Diagnosis not present

## 2019-06-24 DIAGNOSIS — S51811D Laceration without foreign body of right forearm, subsequent encounter: Secondary | ICD-10-CM | POA: Diagnosis not present

## 2019-06-24 DIAGNOSIS — I25119 Atherosclerotic heart disease of native coronary artery with unspecified angina pectoris: Secondary | ICD-10-CM | POA: Diagnosis not present

## 2019-06-25 DIAGNOSIS — I25119 Atherosclerotic heart disease of native coronary artery with unspecified angina pectoris: Secondary | ICD-10-CM | POA: Diagnosis not present

## 2019-06-25 DIAGNOSIS — J449 Chronic obstructive pulmonary disease, unspecified: Secondary | ICD-10-CM | POA: Diagnosis not present

## 2019-06-25 DIAGNOSIS — G43909 Migraine, unspecified, not intractable, without status migrainosus: Secondary | ICD-10-CM | POA: Diagnosis not present

## 2019-06-25 DIAGNOSIS — S51811D Laceration without foreign body of right forearm, subsequent encounter: Secondary | ICD-10-CM | POA: Diagnosis not present

## 2019-06-25 DIAGNOSIS — I252 Old myocardial infarction: Secondary | ICD-10-CM | POA: Diagnosis not present

## 2019-06-25 DIAGNOSIS — G40909 Epilepsy, unspecified, not intractable, without status epilepticus: Secondary | ICD-10-CM | POA: Diagnosis not present

## 2019-06-26 IMAGING — PT NM PET TUM IMG RESTAG (PS) SKULL BASE T - THIGH
1 of 7 series · 2 of 25 positions shown · non-contrast
Comparison: 02/16/16

CLINICAL DATA: Subsequent treatment strategy for small cell lung
cancer..

EXAM:
NUCLEAR MEDICINE PET SKULL BASE TO THIGH
TECHNIQUE: 6.15 mCi F-18 FDG was injected intravenously. Full-ring PET imaging
was performed from the skull base to thigh after the radiotracer. CT
data was obtained and used for attenuation correction and anatomic
localization.
FASTING BLOOD GLUCOSE:  Value: 100 mg/dl

[Series 4: ct sk_thigh 5.0 b31f · axial · 5.0mm · 0.84mm/px · z∈[-877,-113]mm · 2 of 192 slices shown]
[im 1/192  brain]
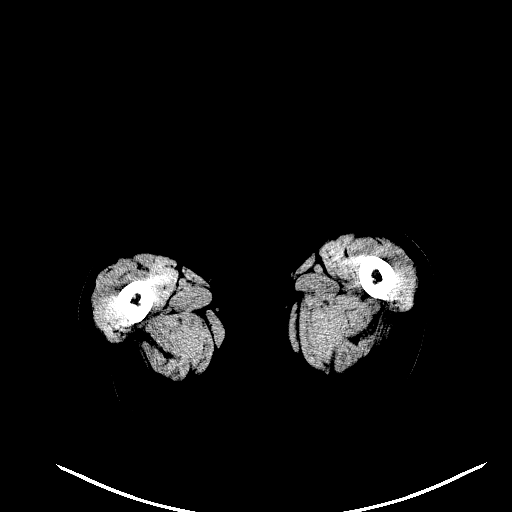
[im 192/192  brain]
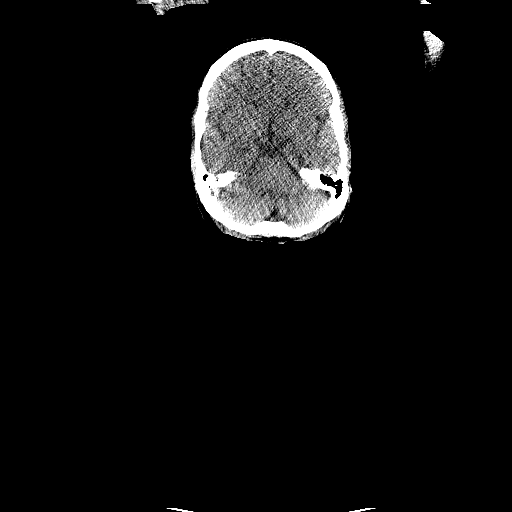

[2 of 25 positions shown; findings below may reference images not displayed]

FINDINGS: NECK: No hypermetabolic lymph nodes in the neck. Resolution of
previously noted hypermetabolism along the right base of tongue.

CHEST: Interval resolution of previous hypermetabolism associated
with right paratracheal adenopathy. No new or progressive
hypermetabolic thoracic adenopathy.

No pleural effusion. Advanced changes of centrilobular and
paraseptal emphysema. No hypermetabolic pulmonary nodules
identified.

The heart size is normal. Aortic atherosclerosis noted.
Calcifications within the RCA and LAD coronary artery noted.

ABDOMEN/PELVIS: Aortic atherosclerosis.

SKELETON: No focal hypermetabolic activity to suggest skeletal
metastasis.
IMPRESSION: 1. Interval complete meta by response to therapy. No residual areas
of hypermetabolism identified within the chest.
2. Resolution of previous increased uptake at the right base of
tongue
3. Aortic Atherosclerosis (0UKJN-5C9.9) and Emphysema (0UKJN-C3J.D).
4. Multi vessel coronary artery calcifications.

## 2019-06-28 ENCOUNTER — Telehealth: Payer: Self-pay | Admitting: Adult Health

## 2019-06-28 NOTE — Telephone Encounter (Signed)
New Message   Patient is calling because she needs her boyfriend to come with her to her appt due to her memory issues and her balance.

## 2019-06-28 NOTE — Telephone Encounter (Signed)
Returned call to patient, made aware boyfriend can come.   Verbalized understanding.
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
## 2019-06-30 ENCOUNTER — Ambulatory Visit (INDEPENDENT_AMBULATORY_CARE_PROVIDER_SITE_OTHER): Payer: Medicare Other | Admitting: Adult Health

## 2019-06-30 ENCOUNTER — Other Ambulatory Visit: Payer: Self-pay

## 2019-06-30 ENCOUNTER — Encounter: Payer: Self-pay | Admitting: Adult Health

## 2019-06-30 VITALS — BP 138/69 | HR 85 | Temp 96.9°F | Ht 65.0 in | Wt 129.4 lb

## 2019-06-30 DIAGNOSIS — I10 Essential (primary) hypertension: Secondary | ICD-10-CM | POA: Diagnosis not present

## 2019-06-30 DIAGNOSIS — Z9861 Coronary angioplasty status: Secondary | ICD-10-CM | POA: Diagnosis not present

## 2019-06-30 DIAGNOSIS — R296 Repeated falls: Secondary | ICD-10-CM

## 2019-06-30 DIAGNOSIS — J449 Chronic obstructive pulmonary disease, unspecified: Secondary | ICD-10-CM | POA: Diagnosis not present

## 2019-06-30 DIAGNOSIS — I951 Orthostatic hypotension: Secondary | ICD-10-CM | POA: Diagnosis not present

## 2019-06-30 DIAGNOSIS — U071 COVID-19: Secondary | ICD-10-CM | POA: Diagnosis not present

## 2019-06-30 DIAGNOSIS — I2510 Atherosclerotic heart disease of native coronary artery without angina pectoris: Secondary | ICD-10-CM

## 2019-06-30 DIAGNOSIS — M791 Myalgia, unspecified site: Secondary | ICD-10-CM

## 2019-06-30 NOTE — Patient Instructions (Signed)
Medication Instructions:  Continue same medications *If you need a refill on your cardiac medications before your next appointment, please call your pharmacy*  Lab Work: None ordered  Testing/Procedures: None ordered  Follow-Up: At [facility], you and your health needs are our priority.  As part of our continuing mission to provide you with exceptional heart care, we have created designated Provider Care Teams.  These Care Teams include your primary Cardiologist (physician) and Advanced Practice Providers (APPs -  Physician Assistants and Nurse Practitioners) who all work together to provide you with the care you need, when you need it.  Your next appointment:  1 year  The format for your next appointment:  Office   Provider:  Dr.Harding

## 2019-07-01 DIAGNOSIS — J449 Chronic obstructive pulmonary disease, unspecified: Secondary | ICD-10-CM | POA: Diagnosis not present

## 2019-07-01 DIAGNOSIS — I252 Old myocardial infarction: Secondary | ICD-10-CM | POA: Diagnosis not present

## 2019-07-01 DIAGNOSIS — S51811D Laceration without foreign body of right forearm, subsequent encounter: Secondary | ICD-10-CM | POA: Diagnosis not present

## 2019-07-01 DIAGNOSIS — G43909 Migraine, unspecified, not intractable, without status migrainosus: Secondary | ICD-10-CM | POA: Diagnosis not present

## 2019-07-01 DIAGNOSIS — G40909 Epilepsy, unspecified, not intractable, without status epilepticus: Secondary | ICD-10-CM | POA: Diagnosis not present

## 2019-07-01 DIAGNOSIS — I25119 Atherosclerotic heart disease of native coronary artery with unspecified angina pectoris: Secondary | ICD-10-CM | POA: Diagnosis not present

## 2019-07-04 DIAGNOSIS — Z85118 Personal history of other malignant neoplasm of bronchus and lung: Secondary | ICD-10-CM | POA: Diagnosis not present

## 2019-07-04 DIAGNOSIS — I252 Old myocardial infarction: Secondary | ICD-10-CM | POA: Diagnosis not present

## 2019-07-04 DIAGNOSIS — Z9181 History of falling: Secondary | ICD-10-CM | POA: Diagnosis not present

## 2019-07-04 DIAGNOSIS — N281 Cyst of kidney, acquired: Secondary | ICD-10-CM | POA: Diagnosis not present

## 2019-07-04 DIAGNOSIS — Z7902 Long term (current) use of antithrombotics/antiplatelets: Secondary | ICD-10-CM | POA: Diagnosis not present

## 2019-07-04 DIAGNOSIS — J449 Chronic obstructive pulmonary disease, unspecified: Secondary | ICD-10-CM | POA: Diagnosis not present

## 2019-07-04 DIAGNOSIS — F329 Major depressive disorder, single episode, unspecified: Secondary | ICD-10-CM | POA: Diagnosis not present

## 2019-07-04 DIAGNOSIS — U071 COVID-19: Secondary | ICD-10-CM | POA: Diagnosis not present

## 2019-07-04 DIAGNOSIS — K219 Gastro-esophageal reflux disease without esophagitis: Secondary | ICD-10-CM | POA: Diagnosis not present

## 2019-07-04 DIAGNOSIS — S51811D Laceration without foreign body of right forearm, subsequent encounter: Secondary | ICD-10-CM | POA: Diagnosis not present

## 2019-07-04 DIAGNOSIS — D1723 Benign lipomatous neoplasm of skin and subcutaneous tissue of right leg: Secondary | ICD-10-CM | POA: Diagnosis not present

## 2019-07-04 DIAGNOSIS — G40909 Epilepsy, unspecified, not intractable, without status epilepticus: Secondary | ICD-10-CM | POA: Diagnosis not present

## 2019-07-04 DIAGNOSIS — I25119 Atherosclerotic heart disease of native coronary artery with unspecified angina pectoris: Secondary | ICD-10-CM | POA: Diagnosis not present

## 2019-07-04 DIAGNOSIS — M8580 Other specified disorders of bone density and structure, unspecified site: Secondary | ICD-10-CM | POA: Diagnosis not present

## 2019-07-04 DIAGNOSIS — M1990 Unspecified osteoarthritis, unspecified site: Secondary | ICD-10-CM | POA: Diagnosis not present

## 2019-07-04 DIAGNOSIS — G43909 Migraine, unspecified, not intractable, without status migrainosus: Secondary | ICD-10-CM | POA: Diagnosis not present

## 2019-07-04 DIAGNOSIS — Z87891 Personal history of nicotine dependence: Secondary | ICD-10-CM | POA: Diagnosis not present

## 2019-07-04 DIAGNOSIS — Z95818 Presence of other cardiac implants and grafts: Secondary | ICD-10-CM | POA: Diagnosis not present

## 2019-07-04 DIAGNOSIS — K449 Diaphragmatic hernia without obstruction or gangrene: Secondary | ICD-10-CM | POA: Diagnosis not present

## 2019-07-05 DIAGNOSIS — I252 Old myocardial infarction: Secondary | ICD-10-CM | POA: Diagnosis not present

## 2019-07-05 DIAGNOSIS — G40909 Epilepsy, unspecified, not intractable, without status epilepticus: Secondary | ICD-10-CM | POA: Diagnosis not present

## 2019-07-05 DIAGNOSIS — I25119 Atherosclerotic heart disease of native coronary artery with unspecified angina pectoris: Secondary | ICD-10-CM | POA: Diagnosis not present

## 2019-07-05 DIAGNOSIS — J449 Chronic obstructive pulmonary disease, unspecified: Secondary | ICD-10-CM | POA: Diagnosis not present

## 2019-07-05 DIAGNOSIS — G43909 Migraine, unspecified, not intractable, without status migrainosus: Secondary | ICD-10-CM | POA: Diagnosis not present

## 2019-07-05 DIAGNOSIS — U071 COVID-19: Secondary | ICD-10-CM | POA: Diagnosis not present

## 2019-07-07 DIAGNOSIS — J449 Chronic obstructive pulmonary disease, unspecified: Secondary | ICD-10-CM | POA: Diagnosis not present

## 2019-07-07 DIAGNOSIS — I252 Old myocardial infarction: Secondary | ICD-10-CM | POA: Diagnosis not present

## 2019-07-07 DIAGNOSIS — N281 Cyst of kidney, acquired: Secondary | ICD-10-CM | POA: Diagnosis not present

## 2019-07-07 DIAGNOSIS — I25119 Atherosclerotic heart disease of native coronary artery with unspecified angina pectoris: Secondary | ICD-10-CM | POA: Diagnosis not present

## 2019-07-07 DIAGNOSIS — G43909 Migraine, unspecified, not intractable, without status migrainosus: Secondary | ICD-10-CM | POA: Diagnosis not present

## 2019-07-07 DIAGNOSIS — G40909 Epilepsy, unspecified, not intractable, without status epilepticus: Secondary | ICD-10-CM | POA: Diagnosis not present

## 2019-07-07 DIAGNOSIS — U071 COVID-19: Secondary | ICD-10-CM | POA: Diagnosis not present

## 2019-07-09 ENCOUNTER — Ambulatory Visit: Payer: Medicare Other | Admitting: Pulmonary Disease

## 2019-07-12 DIAGNOSIS — N3020 Other chronic cystitis without hematuria: Secondary | ICD-10-CM | POA: Diagnosis not present

## 2019-07-12 DIAGNOSIS — N393 Stress incontinence (female) (male): Secondary | ICD-10-CM | POA: Diagnosis not present

## 2019-07-12 DIAGNOSIS — N281 Cyst of kidney, acquired: Secondary | ICD-10-CM | POA: Diagnosis not present

## 2019-07-14 DIAGNOSIS — J449 Chronic obstructive pulmonary disease, unspecified: Secondary | ICD-10-CM | POA: Diagnosis not present

## 2019-07-14 DIAGNOSIS — G40909 Epilepsy, unspecified, not intractable, without status epilepticus: Secondary | ICD-10-CM | POA: Diagnosis not present

## 2019-07-14 DIAGNOSIS — G43909 Migraine, unspecified, not intractable, without status migrainosus: Secondary | ICD-10-CM | POA: Diagnosis not present

## 2019-07-14 DIAGNOSIS — U071 COVID-19: Secondary | ICD-10-CM | POA: Diagnosis not present

## 2019-07-14 DIAGNOSIS — I252 Old myocardial infarction: Secondary | ICD-10-CM | POA: Diagnosis not present

## 2019-07-14 DIAGNOSIS — I25119 Atherosclerotic heart disease of native coronary artery with unspecified angina pectoris: Secondary | ICD-10-CM | POA: Diagnosis not present

## 2019-07-16 DIAGNOSIS — G40909 Epilepsy, unspecified, not intractable, without status epilepticus: Secondary | ICD-10-CM | POA: Diagnosis not present

## 2019-07-16 DIAGNOSIS — J449 Chronic obstructive pulmonary disease, unspecified: Secondary | ICD-10-CM | POA: Diagnosis not present

## 2019-07-16 DIAGNOSIS — U071 COVID-19: Secondary | ICD-10-CM | POA: Diagnosis not present

## 2019-07-16 DIAGNOSIS — G43909 Migraine, unspecified, not intractable, without status migrainosus: Secondary | ICD-10-CM | POA: Diagnosis not present

## 2019-07-16 DIAGNOSIS — I25119 Atherosclerotic heart disease of native coronary artery with unspecified angina pectoris: Secondary | ICD-10-CM | POA: Diagnosis not present

## 2019-07-16 DIAGNOSIS — I252 Old myocardial infarction: Secondary | ICD-10-CM | POA: Diagnosis not present

## 2019-07-20 DIAGNOSIS — G43909 Migraine, unspecified, not intractable, without status migrainosus: Secondary | ICD-10-CM | POA: Diagnosis not present

## 2019-07-20 DIAGNOSIS — G40909 Epilepsy, unspecified, not intractable, without status epilepticus: Secondary | ICD-10-CM | POA: Diagnosis not present

## 2019-07-20 DIAGNOSIS — I252 Old myocardial infarction: Secondary | ICD-10-CM | POA: Diagnosis not present

## 2019-07-20 DIAGNOSIS — U071 COVID-19: Secondary | ICD-10-CM | POA: Diagnosis not present

## 2019-07-20 DIAGNOSIS — I25119 Atherosclerotic heart disease of native coronary artery with unspecified angina pectoris: Secondary | ICD-10-CM | POA: Diagnosis not present

## 2019-07-20 DIAGNOSIS — J449 Chronic obstructive pulmonary disease, unspecified: Secondary | ICD-10-CM | POA: Diagnosis not present

## 2019-07-21 DIAGNOSIS — I252 Old myocardial infarction: Secondary | ICD-10-CM | POA: Diagnosis not present

## 2019-07-21 DIAGNOSIS — G40909 Epilepsy, unspecified, not intractable, without status epilepticus: Secondary | ICD-10-CM | POA: Diagnosis not present

## 2019-07-21 DIAGNOSIS — U071 COVID-19: Secondary | ICD-10-CM | POA: Diagnosis not present

## 2019-07-21 DIAGNOSIS — J449 Chronic obstructive pulmonary disease, unspecified: Secondary | ICD-10-CM | POA: Diagnosis not present

## 2019-07-21 DIAGNOSIS — G43909 Migraine, unspecified, not intractable, without status migrainosus: Secondary | ICD-10-CM | POA: Diagnosis not present

## 2019-07-21 DIAGNOSIS — I25119 Atherosclerotic heart disease of native coronary artery with unspecified angina pectoris: Secondary | ICD-10-CM | POA: Diagnosis not present

## 2019-07-27 ENCOUNTER — Other Ambulatory Visit: Payer: Self-pay

## 2019-07-27 DIAGNOSIS — I25119 Atherosclerotic heart disease of native coronary artery with unspecified angina pectoris: Secondary | ICD-10-CM | POA: Diagnosis not present

## 2019-07-27 DIAGNOSIS — I252 Old myocardial infarction: Secondary | ICD-10-CM | POA: Diagnosis not present

## 2019-07-27 DIAGNOSIS — J449 Chronic obstructive pulmonary disease, unspecified: Secondary | ICD-10-CM | POA: Diagnosis not present

## 2019-07-27 DIAGNOSIS — U071 COVID-19: Secondary | ICD-10-CM | POA: Diagnosis not present

## 2019-07-27 DIAGNOSIS — G40909 Epilepsy, unspecified, not intractable, without status epilepticus: Secondary | ICD-10-CM | POA: Diagnosis not present

## 2019-07-27 DIAGNOSIS — G43909 Migraine, unspecified, not intractable, without status migrainosus: Secondary | ICD-10-CM | POA: Diagnosis not present

## 2019-07-27 MED ORDER — ARNUITY ELLIPTA 100 MCG/ACT IN AEPB: 1.0000 | INHALATION_SPRAY | Freq: Every day | RESPIRATORY_TRACT | 4 refills | Status: DC
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
## 2019-07-28 ENCOUNTER — Encounter: Payer: Self-pay | Admitting: *Deleted

## 2019-07-28 ENCOUNTER — Encounter: Payer: Self-pay | Admitting: General Practice

## 2019-07-28 ENCOUNTER — Ambulatory Visit (INDEPENDENT_AMBULATORY_CARE_PROVIDER_SITE_OTHER): Payer: Medicare Other | Admitting: General Practice

## 2019-07-28 ENCOUNTER — Other Ambulatory Visit: Payer: Self-pay

## 2019-07-28 VITALS — BP 122/74 | HR 66 | Temp 97.3°F | Ht 65.0 in | Wt 131.2 lb

## 2019-07-28 DIAGNOSIS — I951 Orthostatic hypotension: Secondary | ICD-10-CM | POA: Diagnosis not present

## 2019-07-28 DIAGNOSIS — R002 Palpitations: Secondary | ICD-10-CM | POA: Diagnosis not present

## 2019-07-28 DIAGNOSIS — S92315D Nondisplaced fracture of first metatarsal bone, left foot, subsequent encounter for fracture with routine healing: Secondary | ICD-10-CM | POA: Diagnosis not present

## 2019-07-28 DIAGNOSIS — Z9861 Coronary angioplasty status: Secondary | ICD-10-CM

## 2019-07-28 DIAGNOSIS — D1724 Benign lipomatous neoplasm of skin and subcutaneous tissue of left leg: Secondary | ICD-10-CM | POA: Diagnosis not present

## 2019-07-28 DIAGNOSIS — I2510 Atherosclerotic heart disease of native coronary artery without angina pectoris: Secondary | ICD-10-CM

## 2019-07-28 DIAGNOSIS — M25562 Pain in left knee: Secondary | ICD-10-CM | POA: Diagnosis not present

## 2019-07-28 DIAGNOSIS — M791 Myalgia, unspecified site: Secondary | ICD-10-CM | POA: Diagnosis not present

## 2019-07-28 DIAGNOSIS — F411 Generalized anxiety disorder: Secondary | ICD-10-CM

## 2019-07-28 DIAGNOSIS — M79672 Pain in left foot: Secondary | ICD-10-CM | POA: Diagnosis not present

## 2019-07-28 NOTE — Progress Notes (Signed)
Patient ID: Michele [name], female   DOB: [birth year], 64 y.o.   MRN: [id number] Patient enrolled for [employer] to mail a 14 day ZIO XT long term holter monitor to her home.  Notification and instructions sent to patient via My Chart message.

## 2019-07-28 NOTE — Patient Instructions (Signed)
Medication Instructions:  The current medical regimen is effective;  continue present plan and medications as directed. Please refer to the Current Medication list given to you today. If you need a refill on your cardiac medications before your next appointment, please call your pharmacy.  Testing/Procedures: Your physician has recommended that you wear a 14 DAY ZIO-PATCH monitor. The Zio patch cardiac monitor continuously records heart rhythm data for up to 14 days, this is for patients being evaluated for multiple types heart rhythms. For the first 24 hours post application, please avoid getting the Zio monitor wet in the shower or by excessive sweating during exercise. After that, feel free to carry on with regular activities. Keep soaps and lotions away from the ZIO XT Patch.  This will be mailed to you, please expect 7-10 days to receive.     Follow-Up: keep scheduled appointment   At [facility], you and your health needs are our priority.  As part of our continuing mission to provide you with exceptional heart care, we have created designated Provider Care Teams.  These Care Teams include your primary Cardiologist (physician) and Advanced Practice Providers (APPs -  Physician Assistants and Nurse Practitioners) who all work together to provide you with the care you need, when you need it.  Reduce your risk of getting COVID-19 With your heart disease it is especially important for people at increased risk of severe illness from COVID-19, and those who live with them, to protect themselves from getting COVID-19. The best way to protect yourself and to help reduce the spread of the virus that causes COVID-19 is to: [name] Limit your interactions with other people as much as possible. . Take COVID-19 when you do interact with others. If you start feeling sick and think you may have COVID-19, get in touch with your healthcare provider within 24 hours.  Thank you for choosing [facility] at  [facility]!!

## 2019-07-29 ENCOUNTER — Telehealth: Payer: Self-pay | Admitting: General Practice

## 2019-07-29 NOTE — Telephone Encounter (Addendum)
Spoke with patient about a few concerns: - monitor - explained OK to shower 24 hours after applying monitor but not to get snap on monitor piece wet and dry adhesive strip on chest before re-attaching monitor - swelling in hands/LE - explained this was addressed by NP yesterday and he examined her legs, which is detailed in his note - he suggested ortho f/u - ortho ultrasound test ordered - patient has LE u/s ordered by ortho - wants to know if OK to have this if she is wearing monitor - explained this is OK per monitor instructions - reviewed which tests should be avoided - weight gain & fluid accumulation - she had about 1.5lbs weight gain in 1 month between visits - explained that Denyse Amass NP would've addressed this if acute concern, explained that BNP from 1 month ago was WNL and EF on last echo was normal - bill from [facility] for Nov 2020 monitor - she states medicaid was not billed, only medicare - staff message sent to [employer] for assistance

## 2019-07-29 NOTE — Telephone Encounter (Signed)
Patient called with some follow-up questions from her appointment she had yesterday with Coletta Memos.  The patient wanted to confirm that it is OK to take a shower 24 hours after putting on the monitor. She states the instructions she received are a bit confusing. The nurse told her no, but the paper instructions and the Doctor both say it's OK, so she is not sure which one to do.  The patient also wanted to follow up because she may have forgotten to mention that she has some issues with swelling when she wakes up in the morning. She notices swelling in her feet where her socks sit, and some swelling in her fingers. She has also noticed some weight gain but has not eaten much.

## 2019-07-29 NOTE — Telephone Encounter (Signed)
LMTCB  Per chart review, zio instructions were sent by Markus Daft via MyChart - patient should follow those instructions as noted.   Spoke with Denyse Amass NP who saw patient yesterday and discussed swelling issues with her and he advised her then to contact ortho.

## 2019-08-02 ENCOUNTER — Other Ambulatory Visit: Payer: Self-pay | Admitting: Cardiology

## 2019-08-02 DIAGNOSIS — I25119 Atherosclerotic heart disease of native coronary artery with unspecified angina pectoris: Secondary | ICD-10-CM | POA: Diagnosis not present

## 2019-08-02 DIAGNOSIS — I252 Old myocardial infarction: Secondary | ICD-10-CM | POA: Diagnosis not present

## 2019-08-02 DIAGNOSIS — J449 Chronic obstructive pulmonary disease, unspecified: Secondary | ICD-10-CM | POA: Diagnosis not present

## 2019-08-02 DIAGNOSIS — G43909 Migraine, unspecified, not intractable, without status migrainosus: Secondary | ICD-10-CM | POA: Diagnosis not present

## 2019-08-02 DIAGNOSIS — U071 COVID-19: Secondary | ICD-10-CM | POA: Diagnosis not present

## 2019-08-02 DIAGNOSIS — G40909 Epilepsy, unspecified, not intractable, without status epilepticus: Secondary | ICD-10-CM | POA: Diagnosis not present

## 2019-08-03 ENCOUNTER — Ambulatory Visit: Payer: Medicare Other | Admitting: Cardiovascular Disease

## 2019-08-03 DIAGNOSIS — D1723 Benign lipomatous neoplasm of skin and subcutaneous tissue of right leg: Secondary | ICD-10-CM | POA: Diagnosis not present

## 2019-08-03 DIAGNOSIS — Z95818 Presence of other cardiac implants and grafts: Secondary | ICD-10-CM | POA: Diagnosis not present

## 2019-08-03 DIAGNOSIS — J449 Chronic obstructive pulmonary disease, unspecified: Secondary | ICD-10-CM | POA: Diagnosis not present

## 2019-08-03 DIAGNOSIS — N281 Cyst of kidney, acquired: Secondary | ICD-10-CM | POA: Diagnosis not present

## 2019-08-03 DIAGNOSIS — U071 COVID-19: Secondary | ICD-10-CM | POA: Diagnosis not present

## 2019-08-03 DIAGNOSIS — G43909 Migraine, unspecified, not intractable, without status migrainosus: Secondary | ICD-10-CM | POA: Diagnosis not present

## 2019-08-03 DIAGNOSIS — Z85118 Personal history of other malignant neoplasm of bronchus and lung: Secondary | ICD-10-CM | POA: Diagnosis not present

## 2019-08-03 DIAGNOSIS — M8580 Other specified disorders of bone density and structure, unspecified site: Secondary | ICD-10-CM | POA: Diagnosis not present

## 2019-08-03 DIAGNOSIS — Z7902 Long term (current) use of antithrombotics/antiplatelets: Secondary | ICD-10-CM | POA: Diagnosis not present

## 2019-08-03 DIAGNOSIS — G40909 Epilepsy, unspecified, not intractable, without status epilepticus: Secondary | ICD-10-CM | POA: Diagnosis not present

## 2019-08-03 DIAGNOSIS — Z87891 Personal history of nicotine dependence: Secondary | ICD-10-CM | POA: Diagnosis not present

## 2019-08-03 DIAGNOSIS — F329 Major depressive disorder, single episode, unspecified: Secondary | ICD-10-CM | POA: Diagnosis not present

## 2019-08-03 DIAGNOSIS — I252 Old myocardial infarction: Secondary | ICD-10-CM | POA: Diagnosis not present

## 2019-08-03 DIAGNOSIS — I25119 Atherosclerotic heart disease of native coronary artery with unspecified angina pectoris: Secondary | ICD-10-CM | POA: Diagnosis not present

## 2019-08-03 DIAGNOSIS — Z9181 History of falling: Secondary | ICD-10-CM | POA: Diagnosis not present

## 2019-08-03 DIAGNOSIS — K219 Gastro-esophageal reflux disease without esophagitis: Secondary | ICD-10-CM | POA: Diagnosis not present

## 2019-08-03 DIAGNOSIS — K449 Diaphragmatic hernia without obstruction or gangrene: Secondary | ICD-10-CM | POA: Diagnosis not present

## 2019-08-03 DIAGNOSIS — M1990 Unspecified osteoarthritis, unspecified site: Secondary | ICD-10-CM | POA: Diagnosis not present

## 2019-08-05 ENCOUNTER — Telehealth: Payer: Self-pay | Admitting: Pulmonary Disease

## 2019-08-05 NOTE — Telephone Encounter (Signed)
Called and spoke to pt. Pt c/o prod cough with thick white mucus. Pt denies increase in SOB, CP/tightness, f/c/s, body aches, swelling. Pt states she is suppose to put her holter monitor on today (for 14 days) and was questioning if she should have a CXR due to her prod cough. Pt states she cant have CXR while wearing the monitor. Pt is scheduled with for a CT chest for 4/5 (ordered by [facility]) and is scheduled to see VS on 3/23. Pt states that when she takes mucinex her mucus is much better but when she stops taking it then her mucus comes back. Pt states she is not taking it currently, advised pt to start back on Mucinex and drink plenty of fluid to help with mucus. Pt still requesting a message be sent to see if she needs a CXR today. Pt denied virtual visit.   Will forward to Tammy to advise. Thanks.

## 2019-08-05 NOTE — Telephone Encounter (Signed)
[name] can you take a look at this one , you have seen recently . May need televisti to sort out

## 2019-08-05 NOTE — Telephone Encounter (Signed)
Called spoke with patient and discussed [name]'s recommendations at length.  Patient will purchase more Mucinex - Mucinex DM.  She will call the office if her symptoms persist or worsen even with the Mucinex for a televisit prior to her 3/23 visit with [name].  Nothing further needed; will sign off.

## 2019-08-05 NOTE — Telephone Encounter (Signed)
Agreed patient should start back on Mucinex.  Unsure why patient with stop of provide symptomatic and clinical relief.  I do not believe the patient needs a chest x-ray at this time.  Chest x-ray in January/2021 was stable.  If symptoms or not improving on Mucinex we could consider empiric treatment with antibiotics.  That would require a televisit to further evaluate.  I would proceed forward with already stated cardiology work-up with a Holter monitor.  Keep follow-up with our office.  As always patient can be seen sooner if she would need to be this would need to be virtual if she is symptomatic.  Do not cancel the 08/24/2019 appointment with Dr. Michela Pitcher.  Wyn Quaker, FNP

## 2019-08-06 ENCOUNTER — Other Ambulatory Visit (INDEPENDENT_AMBULATORY_CARE_PROVIDER_SITE_OTHER): Payer: Medicare Other

## 2019-08-06 DIAGNOSIS — R002 Palpitations: Secondary | ICD-10-CM

## 2019-08-06 DIAGNOSIS — I951 Orthostatic hypotension: Secondary | ICD-10-CM

## 2019-08-09 DIAGNOSIS — M25562 Pain in left knee: Secondary | ICD-10-CM | POA: Diagnosis not present

## 2019-08-09 DIAGNOSIS — M1711 Unilateral primary osteoarthritis, right knee: Secondary | ICD-10-CM | POA: Diagnosis not present

## 2019-08-09 DIAGNOSIS — M170 Bilateral primary osteoarthritis of knee: Secondary | ICD-10-CM | POA: Diagnosis not present

## 2019-08-09 DIAGNOSIS — M1712 Unilateral primary osteoarthritis, left knee: Secondary | ICD-10-CM | POA: Diagnosis not present

## 2019-08-10 DIAGNOSIS — I252 Old myocardial infarction: Secondary | ICD-10-CM | POA: Diagnosis not present

## 2019-08-10 DIAGNOSIS — G40909 Epilepsy, unspecified, not intractable, without status epilepticus: Secondary | ICD-10-CM | POA: Diagnosis not present

## 2019-08-10 DIAGNOSIS — U071 COVID-19: Secondary | ICD-10-CM | POA: Diagnosis not present

## 2019-08-10 DIAGNOSIS — N393 Stress incontinence (female) (male): Secondary | ICD-10-CM | POA: Diagnosis not present

## 2019-08-10 DIAGNOSIS — G43909 Migraine, unspecified, not intractable, without status migrainosus: Secondary | ICD-10-CM | POA: Diagnosis not present

## 2019-08-10 DIAGNOSIS — J449 Chronic obstructive pulmonary disease, unspecified: Secondary | ICD-10-CM | POA: Diagnosis not present

## 2019-08-10 DIAGNOSIS — I25119 Atherosclerotic heart disease of native coronary artery with unspecified angina pectoris: Secondary | ICD-10-CM | POA: Diagnosis not present

## 2019-08-16 DIAGNOSIS — N393 Stress incontinence (female) (male): Secondary | ICD-10-CM | POA: Diagnosis not present

## 2019-08-16 DIAGNOSIS — N3020 Other chronic cystitis without hematuria: Secondary | ICD-10-CM | POA: Diagnosis not present

## 2019-08-16 DIAGNOSIS — M545 Low back pain: Secondary | ICD-10-CM | POA: Diagnosis not present

## 2019-08-16 DIAGNOSIS — N952 Postmenopausal atrophic vaginitis: Secondary | ICD-10-CM | POA: Diagnosis not present

## 2019-08-18 ENCOUNTER — Telehealth: Payer: Self-pay | Admitting: Medical

## 2019-08-18 DIAGNOSIS — U071 COVID-19: Secondary | ICD-10-CM | POA: Diagnosis not present

## 2019-08-18 DIAGNOSIS — I252 Old myocardial infarction: Secondary | ICD-10-CM | POA: Diagnosis not present

## 2019-08-18 DIAGNOSIS — J449 Chronic obstructive pulmonary disease, unspecified: Secondary | ICD-10-CM | POA: Diagnosis not present

## 2019-08-18 DIAGNOSIS — G40909 Epilepsy, unspecified, not intractable, without status epilepticus: Secondary | ICD-10-CM | POA: Diagnosis not present

## 2019-08-18 DIAGNOSIS — G43909 Migraine, unspecified, not intractable, without status migrainosus: Secondary | ICD-10-CM | POA: Diagnosis not present

## 2019-08-18 DIAGNOSIS — I25119 Atherosclerotic heart disease of native coronary artery with unspecified angina pectoris: Secondary | ICD-10-CM | POA: Diagnosis not present

## 2019-08-18 NOTE — Telephone Encounter (Signed)
Patient called regarding her blood pressures. She said that she slept on the couch last night and when she woke up she felt dizzy, both laying down and standing up. Also felt her eyes were moving abnormally. Denied weakness, headache, or slurred speech. Denied chest pain or worse SOB (she has chronic emphysema). No fever. Feels generally weak since having COVID in December. She drink lots of water. Patient did say she has felt dizzy since her chemo and radiation and has chronic balance issues since then.   PT/home health came to work with her earlier in the day for previous foot fracture. She felt OK during this. BP was 142/68 with pulse of 83. The patient later took her vitals showing BP 136/81 with pulse of 83. She is not on antihypertensives. She was given midodrine to take as needed for orthostatic hypotension. She felt her pressures were high since pressures normally run 120s/60s. Patient was very concerned given her complicated medical history. I told her that the pressures were reasonable and to continue to monitor them tomorrow. I recommended the patient follow-up with PCP if dizziness continues and to go to the ED if symptoms worsened, if she fainted, pressures were severely elevated, CP, or had other worrisome symptoms. I assured that I would let Dr. [name] know regarding her blood pressures.   She also has a 14 day ziopatch for palpitations and a f/u with Dr. [name] April 5th.  Adair Lauderback  Kathlen Mody, PA-C

## 2019-08-19 ENCOUNTER — Telehealth: Payer: Self-pay | Admitting: Pulmonary Disease

## 2019-08-19 NOTE — Telephone Encounter (Signed)
Per Opal Sidles up front, patient called needing to speak with someone regarding her covid vaccine  Spoke with patient who reported she is scheduled for her 1st covid vaccine on 3/20.  When she called [facility] to verify the appt date, she clicked on the option for covid vaccine questions and believes that she was informed that she may not receive her vaccine because she tested positive for covid 05/21/19.  Patient is asking for clarification.  Advised patient will call [facility] and call her back  [facility] and spoke with Aldona Bar who checked with pharmacist >> as long as patient has not tested positive for covid within the past 14 days, she is cleared to receive the vaccine.  They recommend patient receive a rapid test to make sure.  Called spoke with patient, discussed the above.  Patient voiced her understanding and will go get a rapid test through [facility] today.    Patient to call [facility] to schedule her covid vaccine.  Nothing further needed; will sign off.

## 2019-08-19 NOTE — Telephone Encounter (Deleted)
Error

## 2019-08-20 ENCOUNTER — Telehealth: Payer: Self-pay | Admitting: Pulmonary Disease

## 2019-08-20 NOTE — Telephone Encounter (Signed)
Spoke with pt, advised that our office is recommending all of our patients to get the Covid vaccine unless they have had an anaphylactic reaction to a vaccine.  Pt expressed understanding.  Nothing further needed at this time- will close encounter.

## 2019-08-21 DIAGNOSIS — Z23 Encounter for immunization: Secondary | ICD-10-CM | POA: Diagnosis not present

## 2019-08-22 NOTE — Telephone Encounter (Signed)
Agree.  Peripherally normal blood pressures.  I am hoping that her pressures stay in this range.  She should not worry about them.  Glenetta Hew, MD

## 2019-08-24 ENCOUNTER — Ambulatory Visit (INDEPENDENT_AMBULATORY_CARE_PROVIDER_SITE_OTHER): Payer: Medicare Other

## 2019-08-24 ENCOUNTER — Ambulatory Visit (INDEPENDENT_AMBULATORY_CARE_PROVIDER_SITE_OTHER): Payer: Medicare Other | Admitting: Pulmonary Disease

## 2019-08-24 ENCOUNTER — Other Ambulatory Visit: Payer: Self-pay

## 2019-08-24 ENCOUNTER — Encounter: Payer: Self-pay | Admitting: Pulmonary Disease

## 2019-08-24 VITALS — BP 106/62 | HR 72 | Temp 97.2°F | Ht 65.0 in | Wt 131.0 lb

## 2019-08-24 DIAGNOSIS — Z720 Tobacco use: Secondary | ICD-10-CM

## 2019-08-24 DIAGNOSIS — R05 Cough: Secondary | ICD-10-CM

## 2019-08-24 DIAGNOSIS — Z9861 Coronary angioplasty status: Secondary | ICD-10-CM | POA: Diagnosis not present

## 2019-08-24 DIAGNOSIS — J449 Chronic obstructive pulmonary disease, unspecified: Secondary | ICD-10-CM

## 2019-08-24 DIAGNOSIS — I2510 Atherosclerotic heart disease of native coronary artery without angina pectoris: Secondary | ICD-10-CM

## 2019-08-24 DIAGNOSIS — R059 Cough, unspecified: Secondary | ICD-10-CM

## 2019-08-24 NOTE — Progress Notes (Signed)
[facility]  Chief Complaint  Patient presents with  . Follow-up    Breathing worse since she had covid vaccine 08/21/19 "left lung is hurting". She also cough with thick, white sputum. She is using her xopenex inhaler 2 x per day and neb with xopenex 3 x per wk on average.     Constitutional:  BP 106/62 (BP Location: Left Arm, Cuff Size: Normal)   Pulse 72   Temp (!) 97.2 F (36.2 C) (Temporal)   Ht 5\' 5"  (1.651 m)   Wt 131 lb (59.4 kg)   SpO2 97%   BMI 21.80 kg/m   Past Medical History:  Vitamin D deficiency, Seizures, RLS, Chronic back pain, HTN, Hiatal hernia, GERD, HLD, Palpitations, Depression, CAD, Cataracts, OA, Anxiety, COVID 22 May 2019  Brief Summary:  Michele [name] is a 64 y.o. female former smokerwithCOPD/emphysema. 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  She was diagnosed with COVID 19 infection in December 2020.  Treated with monoclonal antibody.  Since then has been feeling more fatigued with muscle aches.  Has pain in back on Lt side.  Has some cough with thick, clear sputum.  Xopenex helps.  Doesn't get palpitations as much from [facility] compared to nebulizer.  Physical Exam:   Appearance - well kempt   ENMT - no sinus tenderness, no nasal discharge, no oral exudate  Respiratory - normal appearance of chest wall, normal respiratory effort w/o accessory muscle use, no dullness on percussion, no wheezing or rales  CV - s1s2 regular rate and rhythm, no murmurs, no peripheral edema, radial pulses symmetric  Ext - no cyanosis, clubbing, or joint inflammation noted  Neuro - normal strength, oriented x 3  Psych - anxious   Assessment/Plan:   COPD with emphysema and chronic bronchitis. - continue arnuity, spiriva - intolerant of LABA's due to palpitations - using xopenex in place of albuterol due to palpitations - prn mucinex, flutter valve  Atypical chest pain after COVID 19 infection in  2020. - will repeat chest xray today  Hx of small cell lung cancer. - she has f/u CT chest in April 2021  Atypical chest pain with reflux and hiatal hernia. - she has appt with GI  Tobacco abuse. - she will try nicotine gum  Patient Instructions  Chest xray today  Follow up in 2 months   Time spent 37 minutes  [name], MD [place] Pager: [phone] 08/24/2019, 3:50 PM  Flow Sheet     Pulmonary tests:  PFT 12/23/11 >> FEV1 2.13 (85%), FEV1% 59, TLC 5.98 (112%), DLCO 60% Spirometry 11/19/17 >> FEV1 2.58 (100%), FEV1% 76 FeNO 11/19/17 >> 5  Chest imaging:  CT chest 08/24/16 >> CAD, mod/severe emphysema CT chest 08/29/17 >> severe emphysema CT chest 03/02/18 >> severe centrilobular and paraseptal emphysema, 4 mm nodule RUL partially calcified CT angio chest 04/27/18 >> atherosclerosis, small/mod hiatal hernia, dilation of esophagus with mucosal thickening, severe emphysema with large bulla, bronchiectasis (reviewed by me)  Cardiac tests:  Echo 08/30/17 >> EF 60 to 65%, grade 1 DD, mild MR  Medications:   Allergies as of 08/24/2019      Reactions   Ciprofloxacin Other (See Comments)   Hypoglycemia    Aspirin Other (See Comments)   GI upset; patient is only able to take enteric coated Aspirin.     Crestor [rosuvastatin] Other (See Comments)   Muscle pain    Ibuprofen Other (See Comments)  GI upset    Wellbutrin [bupropion] Palpitations   Zoloft [sertraline Hcl] Other (See Comments)   Insomnia, fatigue    Albuterol Palpitations   Lipitor [atorvastatin] Other (See Comments)   Muscle pain    Sulfonamide Derivatives Itching, Rash      Medication List       Accurate as of August 24, 2019  3:50 PM. If you have any questions, ask your nurse or doctor.        STOP taking these medications   amoxicillin-clavulanate 875-125 MG tablet Commonly known as: Augmentin Stopped by: [name], MD   fluconazole 150 MG tablet Commonly known as:  Diflucan Stopped by: [name], MD     TAKE these medications   AIRBORNE GUMMIES PO Take 2 each by mouth daily.   ALPRAZolam 1 MG tablet Commonly known as: XANAX Take 1 mg by mouth 3 (three) times daily.   Arnuity Ellipta 100 MCG/ACT Aepb Generic drug: Fluticasone Furoate Inhale 1 puff into the lungs daily.   benzonatate 200 MG capsule Commonly known as: TESSALON Take 1 capsule (200 mg total) by mouth 3 (three) times daily as needed for cough.   CITRACAL + D PO Take 1 tablet by mouth in the morning and at bedtime.   CLARITIN-D 12 HOUR PO Take 1 tablet by mouth daily as needed.   clopidogrel 75 MG tablet Commonly known as: PLAVIX Take 1 tablet (75 mg total) by mouth every Monday, Wednesday, and Friday.   fluticasone 50 MCG/ACT nasal spray Commonly known as: FLONASE INSTILL 1 SPRAY INTO BOTH NOSTRILS DAILY What changed: See the new instructions.   Flutter Devi 1 application by Does not apply route 2 (two) times daily.   gabapentin 100 MG capsule Commonly known as: NEURONTIN Take 100 mg by mouth 3 (three) times daily.   isosorbide mononitrate 30 MG 24 hr tablet Commonly known as: IMDUR TAKE 1 TABLET(30 MG) BY MOUTH AT BEDTIME   levalbuterol 0.63 MG/3ML nebulizer solution Commonly known as: Xopenex Take 3 mLs (0.63 mg total) by nebulization every 4 (four) hours as needed for wheezing or shortness of breath (dx: R00.2, J44.9).   levalbuterol 45 MCG/ACT inhaler Commonly known as: XOPENEX HFA Inhale 2 puffs into the lungs every 6 (six) hours as needed for wheezing.   midodrine 2.5 MG tablet Commonly known as: PROAMATINE Take 2.5 mg tablet at breakfast and dinnertime What changed: additional instructions   multivitamin with minerals Tabs tablet Take 1 tablet by mouth daily. Multivitamin for Women 50+   nitroGLYCERIN 0.4 MG SL tablet Commonly known as: Nitrostat Place 1 tablet (0.4 mg total) under the tongue every 5 (five) minutes as needed for chest pain.     pantoprazole 40 MG tablet Commonly known as: PROTONIX Take 40 mg by mouth every other day.   polyethylene glycol 17 g packet Commonly known as: MIRALAX / GLYCOLAX Take 17 g by mouth 2 (two) times daily.   pravastatin 40 MG tablet Commonly known as: PRAVACHOL TAKE 1 TABLET(40 MG) BY MOUTH EVERY EVENING   Restasis 0.05 % ophthalmic emulsion Generic drug: cycloSPORINE INSTILL 1 DROP IN BOTH EYES BID UTD   tiotropium 18 MCG inhalation capsule Commonly known as: SPIRIVA Place 18 mcg into inhaler and inhale daily.   ULTRA COQ10 PO Take 1 capsule by mouth daily.   Vascepa 1 g capsule Generic drug: icosapent Ethyl TAKE 1 CAPSULE BY MOUTH TWICE DAILY       Past Surgical History:  She  has a past surgical history  that includes Leg wound repair / closure (1972); Coronary angioplasty with stent (10/10/11); Coronary angioplasty with stent (11/06/11); Tonsillectomy; Tubal ligation (1970's); Knee surgery; Coronary angioplasty with stent (11/19/2012); NM MYOVIEW LTD (October 2013; 12/2013); CPET (09/07/2012); left heart catheterization with coronary angiogram (N/A, 10/10/2011); percutaneous coronary stent intervention (pci-s) (N/A, 11/06/2011); left heart catheterization with coronary angiogram (N/A, 11/19/2012); left heart catheterization with coronary angiogram (N/A, 02/19/2013); left heart catheterization with coronary angiogram (N/A, 03/02/2014); Colonoscopy; Polypectomy; Video bronchoscopy with endobronchial ultrasound (N/A, 02/14/2016); Direct laryngoscopy (N/A, 02/14/2016); OTHER SURGICAL HISTORY; Breast biopsy (2000's); NM MYOVIEW LTD (02/2016); lipoma surgery (Left, 10/2016); transthoracic echocardiogram (04/01/2019); transthoracic echocardiogram (08/30/2017); Esophagogastroduodenoscopy (egd) with propofol (N/A, 10/29/2018); and EVENT MONITOR (03/2019).  Family History:  Her family history includes Asthma in her mother; Cancer in her maternal grandmother; Colon polyps in her mother; Diabetes in her  mother; Emphysema in her mother; Heart attack in her maternal grandfather; Heart disease in her father and mother; Hyperlipidemia in her mother; Hypertension in her mother; Kidney cancer in her brother; Stomach cancer in her brother and brother; Stroke in her mother; Stroke (age of onset: 56) in her brother; Thyroid cancer in her daughter.  Social History:  She  reports that she has been smoking cigarettes. She has a 60.00 pack-year smoking history. She has never used smokeless tobacco. She reports that she does not drink alcohol or use drugs.

## 2019-08-24 NOTE — Patient Instructions (Signed)
Chest xray today  Follow up in 2 months

## 2019-08-25 ENCOUNTER — Telehealth: Payer: Self-pay | Admitting: Pulmonary Disease

## 2019-08-25 ENCOUNTER — Ambulatory Visit: Payer: Medicare Other | Admitting: Adult Health

## 2019-08-25 NOTE — Telephone Encounter (Signed)
DG Chest 2 View  Result Date: 08/24/2019 CLINICAL DATA:  64 year old female with history of cough and chest pain. EXAM: CHEST - 2 VIEW COMPARISON:  Chest x-ray 06/08/2019. FINDINGS: Lung volumes are appears slightly increased with extensive emphysematous changes, most severe in the right upper lobe. No consolidative airspace disease. No pleural effusions. No pneumothorax. No pulmonary nodule or mass noted. Pulmonary vasculature and the cardiomediastinal silhouette are within normal limits. Atherosclerosis in the thoracic aorta. IMPRESSION: 1. No radiographic evidence of acute cardiopulmonary disease. 2. Emphysema. 3. Aortic atherosclerosis. Electronically Signed   By: Vinnie Langton M.D.   On: 08/24/2019 17:00     Please let her know that her CXR didn't show any evidence for pneumonia.  Not other findings to explain her left back pain.  Will await results of follow up CT chest in April.

## 2019-08-25 NOTE — Telephone Encounter (Signed)
Spoke with the Michele [name] and notified her of her cxr results. She states that she read the interpretation in her Mychart account and is concerned that her Emphysema is worse. She wants to know what [name] thinks about this. I advised that she really needs to focus on a plan to quit smoking again. She agrees. Will forward to [name]. Please advise, thanks.

## 2019-08-25 NOTE — Telephone Encounter (Signed)
LMTCB

## 2019-08-25 NOTE — Telephone Encounter (Signed)
She has long standing history of emphysema.  This does not look any worse compared to previous imaging studies.

## 2019-08-26 DIAGNOSIS — U071 COVID-19: Secondary | ICD-10-CM | POA: Diagnosis not present

## 2019-08-26 DIAGNOSIS — G43909 Migraine, unspecified, not intractable, without status migrainosus: Secondary | ICD-10-CM | POA: Diagnosis not present

## 2019-08-26 DIAGNOSIS — J449 Chronic obstructive pulmonary disease, unspecified: Secondary | ICD-10-CM | POA: Diagnosis not present

## 2019-08-26 DIAGNOSIS — G40909 Epilepsy, unspecified, not intractable, without status epilepticus: Secondary | ICD-10-CM | POA: Diagnosis not present

## 2019-08-26 DIAGNOSIS — I25119 Atherosclerotic heart disease of native coronary artery with unspecified angina pectoris: Secondary | ICD-10-CM | POA: Diagnosis not present

## 2019-08-26 DIAGNOSIS — I252 Old myocardial infarction: Secondary | ICD-10-CM | POA: Diagnosis not present

## 2019-08-27 DIAGNOSIS — R002 Palpitations: Secondary | ICD-10-CM | POA: Diagnosis not present

## 2019-08-31 ENCOUNTER — Encounter: Payer: Self-pay | Admitting: Internal Medicine

## 2019-09-02 ENCOUNTER — Emergency Department (HOSPITAL_COMMUNITY): Payer: Medicare Other

## 2019-09-02 ENCOUNTER — Encounter (HOSPITAL_COMMUNITY): Payer: Self-pay | Admitting: Emergency Medicine

## 2019-09-02 ENCOUNTER — Telehealth: Payer: Self-pay | Admitting: Pulmonary Disease

## 2019-09-02 ENCOUNTER — Emergency Department (HOSPITAL_COMMUNITY)
Admission: EM | Admit: 2019-09-02 | Discharge: 2019-09-02 | Disposition: A | Discharge status: Home / Self Care | Payer: Medicare Other | Attending: Emergency Medicine | Admitting: Emergency Medicine

## 2019-09-02 ENCOUNTER — Other Ambulatory Visit: Payer: Self-pay

## 2019-09-02 DIAGNOSIS — Z85118 Personal history of other malignant neoplasm of bronchus and lung: Secondary | ICD-10-CM | POA: Diagnosis not present

## 2019-09-02 DIAGNOSIS — J449 Chronic obstructive pulmonary disease, unspecified: Secondary | ICD-10-CM | POA: Insufficient documentation

## 2019-09-02 DIAGNOSIS — R0789 Other chest pain: Secondary | ICD-10-CM | POA: Diagnosis not present

## 2019-09-02 DIAGNOSIS — Z9861 Coronary angioplasty status: Secondary | ICD-10-CM | POA: Diagnosis not present

## 2019-09-02 DIAGNOSIS — I2510 Atherosclerotic heart disease of native coronary artery without angina pectoris: Secondary | ICD-10-CM | POA: Insufficient documentation

## 2019-09-02 DIAGNOSIS — I10 Essential (primary) hypertension: Secondary | ICD-10-CM | POA: Diagnosis not present

## 2019-09-02 DIAGNOSIS — R042 Hemoptysis: Secondary | ICD-10-CM

## 2019-09-02 DIAGNOSIS — R1013 Epigastric pain: Secondary | ICD-10-CM

## 2019-09-02 DIAGNOSIS — Z8616 Personal history of COVID-19: Secondary | ICD-10-CM | POA: Insufficient documentation

## 2019-09-02 DIAGNOSIS — I119 Hypertensive heart disease without heart failure: Secondary | ICD-10-CM | POA: Insufficient documentation

## 2019-09-02 DIAGNOSIS — F17210 Nicotine dependence, cigarettes, uncomplicated: Secondary | ICD-10-CM | POA: Diagnosis not present

## 2019-09-02 DIAGNOSIS — Z79899 Other long term (current) drug therapy: Secondary | ICD-10-CM | POA: Diagnosis not present

## 2019-09-02 DIAGNOSIS — R0602 Shortness of breath: Secondary | ICD-10-CM | POA: Diagnosis not present

## 2019-09-02 LAB — COMPREHENSIVE METABOLIC PANEL
ALT: 19 U/L (ref 0–44)
AST: 21 U/L (ref 15–41)
Albumin: 4.4 g/dL (ref 3.5–5.0)
Alkaline Phosphatase: 67 U/L (ref 38–126)
Anion gap: 8 (ref 5–15)
BUN: 13 mg/dL (ref 8–23)
CO2: 27 mmol/L (ref 22–32)
Calcium: 9.5 mg/dL (ref 8.9–10.3)
Chloride: 105 mmol/L (ref 98–111)
Creatinine, Ser: 0.58 mg/dL (ref 0.44–1.00)
GFR calc Af Amer: >60 mL/min (ref >60)
GFR calc non Af Amer: >60 mL/min (ref >60)
Glucose, Bld: 120 mg/dL — ABNORMAL HIGH (ref 70–99)
Potassium: 3.9 mmol/L (ref 3.5–5.1)
Sodium: 140 mmol/L (ref 135–145)
Total Bilirubin: 0.3 mg/dL (ref 0.3–1.2)
Total Protein: 7.1 g/dL (ref 6.5–8.1)

## 2019-09-02 LAB — URINALYSIS, ROUTINE W REFLEX MICROSCOPIC
Bacteria, UA: NONE SEEN
Bilirubin Urine: NEGATIVE
Glucose, UA: NEGATIVE mg/dL
Hgb urine dipstick: NEGATIVE
Ketones, ur: NEGATIVE mg/dL
Nitrite: NEGATIVE
Protein, ur: NEGATIVE mg/dL
Specific Gravity, Urine: 1.009 (ref 1.005–1.030)
pH: 6 (ref 5.0–8.0)

## 2019-09-02 LAB — I-STAT CHEM 8, ED
BUN: 13 mg/dL (ref 8–23)
Calcium, Ion: 1.24 mmol/L (ref 1.15–1.40)
Chloride: 104 mmol/L (ref 98–111)
Creatinine, Ser: 0.6 mg/dL (ref 0.44–1.00)
Glucose, Bld: 71 mg/dL (ref 70–99)
HCT: 40 % (ref 36.0–46.0)
Hemoglobin: 13.6 g/dL (ref 12.0–15.0)
Potassium: 4.1 mmol/L (ref 3.5–5.1)
Sodium: 141 mmol/L (ref 135–145)
TCO2: 31 mmol/L (ref 22–32)

## 2019-09-02 LAB — CBC
HCT: 42.6 % (ref 36.0–46.0)
Hemoglobin: 13.6 g/dL (ref 12.0–15.0)
MCH: 30.2 pg (ref 26.0–34.0)
MCHC: 31.9 g/dL (ref 30.0–36.0)
MCV: 94.7 fL (ref 80.0–100.0)
Platelets: 173 10*3/uL (ref 150–400)
RBC: 4.5 MIL/uL (ref 3.87–5.11)
RDW: 13.6 % (ref 11.5–15.5)
WBC: 5.7 10*3/uL (ref 4.0–10.5)
nRBC: 0 % (ref 0.0–0.2)

## 2019-09-02 LAB — BRAIN NATRIURETIC PEPTIDE: B Natriuretic Peptide: 48.8 pg/mL (ref 0.0–100.0)

## 2019-09-02 LAB — LIPASE, BLOOD: Lipase: 33 U/L (ref 11–51)

## 2019-09-02 LAB — TROPONIN I (HIGH SENSITIVITY): Troponin I (High Sensitivity): 6 ng/L (ref <18)

## 2019-09-02 MED ORDER — PROCHLORPERAZINE EDISYLATE 10 MG/2ML IJ SOLN
10.0000 mg | Freq: Once | INTRAMUSCULAR | Status: AC
  Administered 2019-09-02: 10 mg via INTRAVENOUS
  Filled 2019-09-02: qty 2

## 2019-09-02 MED ORDER — SODIUM CHLORIDE (PF) 0.9 % IJ SOLN
INTRAMUSCULAR | Status: AC
  Filled 2019-09-02: qty 50

## 2019-09-02 MED ORDER — MORPHINE SULFATE (PF) 2 MG/ML IV SOLN
2.0000 mg | Freq: Once | INTRAVENOUS | Status: AC
  Administered 2019-09-02: 2 mg via INTRAVENOUS
  Filled 2019-09-02: qty 1

## 2019-09-02 MED ORDER — ALUM & MAG HYDROXIDE-SIMETH 200-200-20 MG/5ML PO SUSP
30.0000 mL | Freq: Once | ORAL | Status: AC
  Administered 2019-09-02: 30 mL via ORAL
  Filled 2019-09-02: qty 30

## 2019-09-02 MED ORDER — DIPHENHYDRAMINE HCL 50 MG/ML IJ SOLN
25.0000 mg | Freq: Once | INTRAMUSCULAR | Status: AC
  Administered 2019-09-02: 25 mg via INTRAVENOUS
  Filled 2019-09-02 (×3): qty 1

## 2019-09-02 MED ORDER — MORPHINE SULFATE 15 MG PO TABS: 7.5000 mg | ORAL_TABLET | ORAL | 0 refills | Status: DC | PRN

## 2019-09-02 MED ORDER — MORPHINE SULFATE (PF) 4 MG/ML IV SOLN: 4.0000 mg | Freq: Once | INTRAVENOUS | Status: DC

## 2019-09-02 MED ORDER — ONDANSETRON 4 MG PO TBDP: ORAL_TABLET | ORAL | 0 refills | Status: DC

## 2019-09-02 MED ORDER — IOHEXOL 350 MG/ML SOLN
100.0000 mL | Freq: Once | INTRAVENOUS | Status: AC | PRN
  Administered 2019-09-02: 100 mL via INTRAVENOUS

## 2019-09-02 NOTE — Telephone Encounter (Signed)
We are not primary care.  Ms. Shepperson is aware of this.  Agree with previous recommendations from triage that she needs to contact her primary care regarding her abdominal pain.I would also emphasized that if she is having abdominal pain as well as coughing up blood she may need to seek emergent evaluation as previously recommended.Wyn Quaker, FNP

## 2019-09-02 NOTE — Telephone Encounter (Signed)
Spoke with pt. She is aware of [name]'s recommendation. Nothing further was needed.

## 2019-09-02 NOTE — Discharge Instructions (Signed)
You had an abnormal area near the right lung.  The radiologist recommend that you get this followed up with a different imaging study.  Please discuss this with your oncologist.  Please return for worsening coughing up blood fever or inability to eat or drink.

## 2019-09-02 NOTE — Telephone Encounter (Signed)
Spoke with pt. States that she spoke with someone earlier and told them that she was having abdominal pain and was instructed to call her PCP. Pt has not called her PCP but wants to know if it would be okay for her take Mylanta to help with the pain. Advised her again that she needed to contact her PCP about this to get further recommendations.  [name] - please advise if she can take Mylanta. Thanks.

## 2019-09-02 NOTE — ED Triage Notes (Signed)
C/C coughing up blood that started this morning, spitting up blood/flem relieved with mucinex, patient states she thought it was a blood clot. Endorses SOB, and abd pain as well.

## 2019-09-02 NOTE — ED Notes (Signed)
Pt ambulated to RR without assistance.  Pt reports being "off-balance" since she had chemo and radiation.  Pt was instructed to use call cord when she is finished for assistance.

## 2019-09-02 NOTE — ED Provider Notes (Signed)
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 Discharge Orders         Ordered    ondansetron (ZOFRAN ODT) 4 MG disintegrating tablet     09/02/19 2217    morphine (MSIR) 15 MG tablet  Every 4 hours PRN     09/02/19 2217           Deno Etienne, DO 09/02/19 2220

## 2019-09-02 NOTE — ED Notes (Signed)
Attempted IV access times 2. Unsuccessful

## 2019-09-02 NOTE — Telephone Encounter (Signed)
09/02/19  How long she had the symptoms?  Did she just start having a productive cough today?  She has baseline bronchiectasis that she most likely had a productive cough ongoing.  She needs to continue to monitor her cough.  If she continues to cough up blood or has any more episodes she will need to present to the emergency room or urgent care for further evaluation.  Is a patient have any chest pain, heart racing, sudden increase shortness of breath?  Wyn Quaker, FNP

## 2019-09-02 NOTE — Telephone Encounter (Signed)
Primary Pulmonologist: Dr. Halford Chessman Last office visit and with whom: 08/24/19, Dr. Halford Chessman What do we see them for (pulmonary problems): cough, bronchiectasis,copd Last OV assessment/plan:  Instructions    Return in about 2 months (around 10/24/2019). Chest xray today  Follow up in 2 months      Reason for call: Called and spoke with Patient.  Patient stated she was seen by Dr. Halford Chessman, 08/24/19, and had a cxr.  Patient stated she woke this morning with a productive cough. Patient stated her sputum was bright red to start, but changed to yellow.  Patient stated she has coughed blood in the past, but never this much at one time.  Patient is not coughing any blood or phlegm at this time. Patient stated the only med change, is she has added Mucinex as directed by Dr. Halford Chessman, but it is just as needed for her congestion. Patient has been using a humidifier at night. Patient stated she has general sob, some weakness, diarrhea, and abdominal pain. Patient denies any fever, or chills.   Message routed to [place] to advise

## 2019-09-02 NOTE — Telephone Encounter (Signed)
Called and spoke with Patient.  [name],NP recommendations given.  Confirmed information from initial call, Patient has not had any more coughing episodes since this morning, no chest pains, or increased sob. Patient concerned with her abdominal pain and diarrhea.  Advise Patient to contact PCP, urgent care, or ED. Understanding stated.

## 2019-09-03 IMAGING — CT CT ABD-PELV W/ CM
1 of 3 series · 14 of 32 positions shown, 19 images · IV contrast (OMNIPAQUE)
Comparison: Multiple exams, including PET-CT of 02/26/2017 and
prior CT abdomen 01/19/2017

CLINICAL DATA: Lower abdominal pain radiating up into the chest and
back. Small cell lung cancer diagnosed in 0810 with chemotherapy and
radiation therapy, last treatment May 2016. Weight loss.
Fatigue.

EXAM:
CT ABDOMEN AND PELVIS WITH CONTRAST
TECHNIQUE: Multidetector CT imaging of the abdomen and pelvis was performed
using the standard protocol following bolus administration of
intravenous contrast.
CONTRAST:  100mL V3QDVN-QTT IOPAMIDOL (V3QDVN-QTT) INJECTION 61%

[Series 2: routine abdomen/pelvis with · axial · 0.63mm/px · z∈[+586,+986]mm · 14 of 90 slices shown, 19 images]
[im 5/90  soft-tissue]
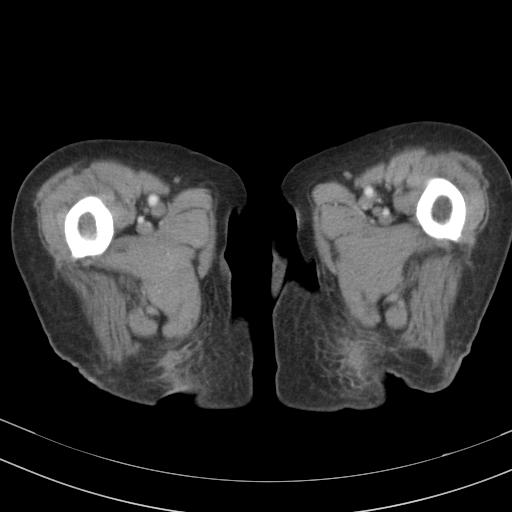
[im 5/90  bone]
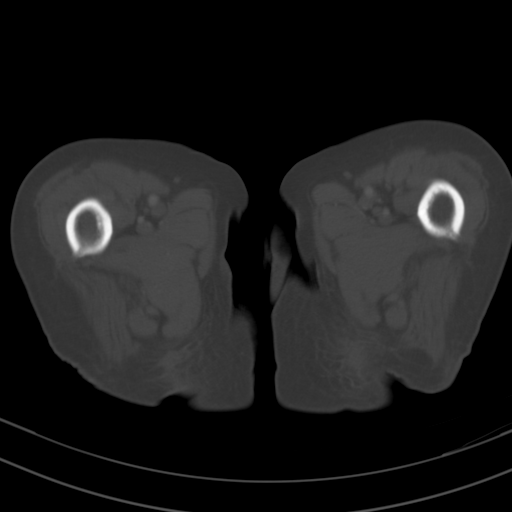
[im 14/90  soft-tissue]
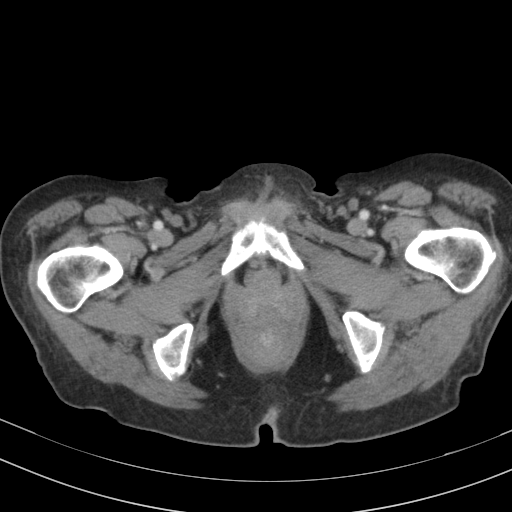
[im 18/90  soft-tissue]
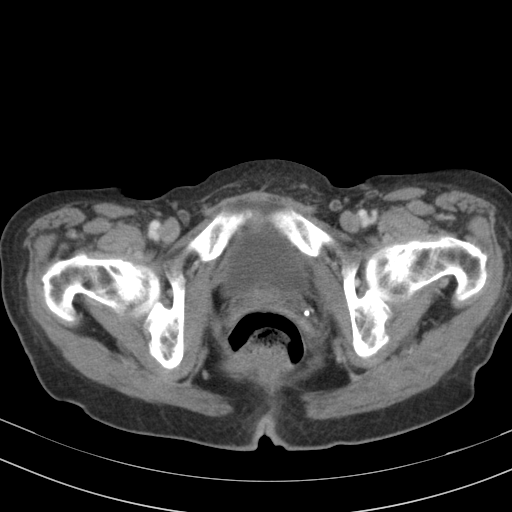
[im 27/90  soft-tissue]
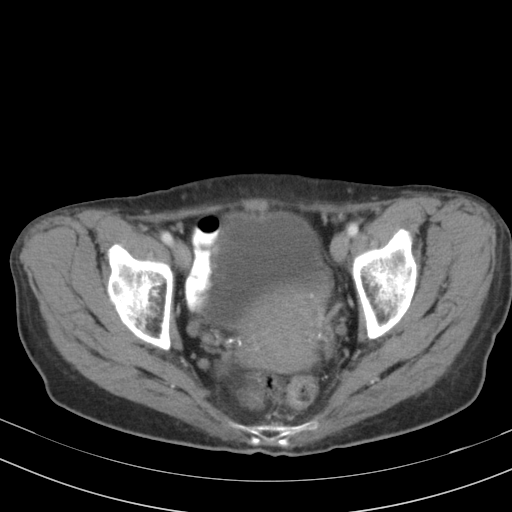
[im 32/90  soft-tissue]
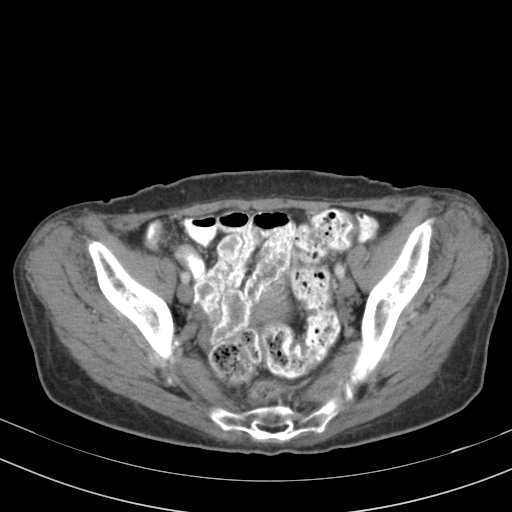
[im 41/90  soft-tissue]
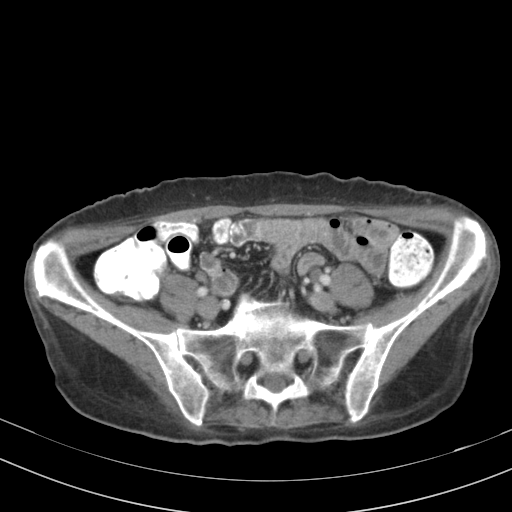
[im 45/90  soft-tissue]
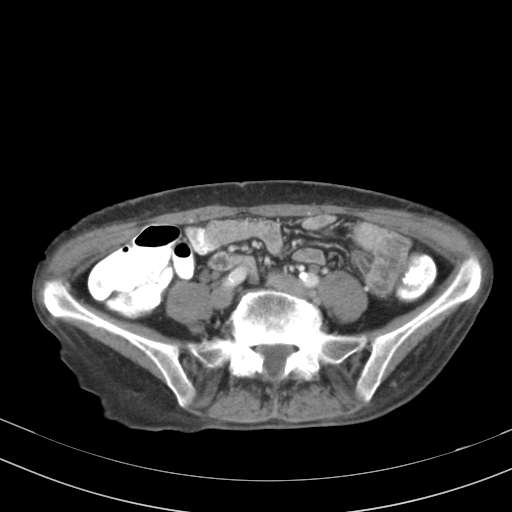
[im 49/90  soft-tissue]
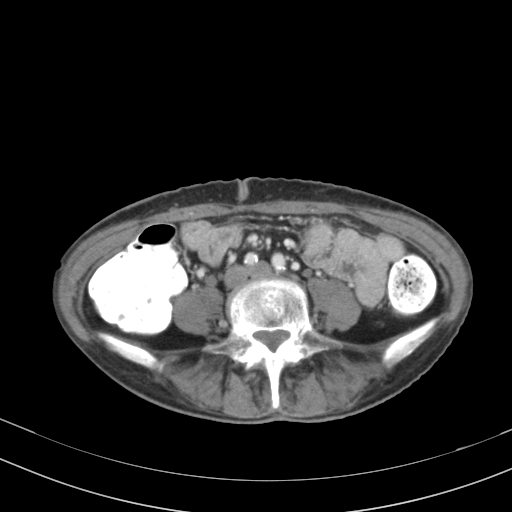
[im 58/90  soft-tissue]
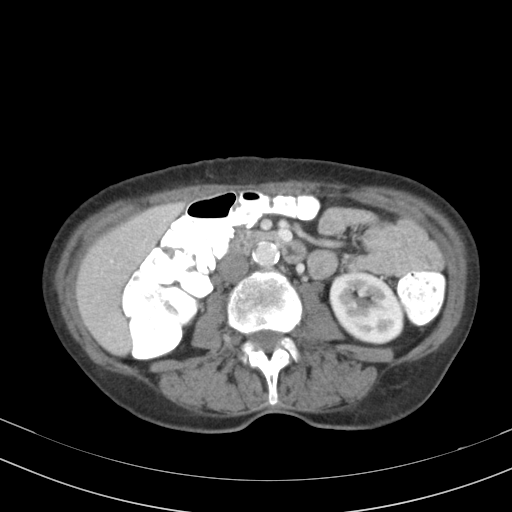
[im 58/90  bone]
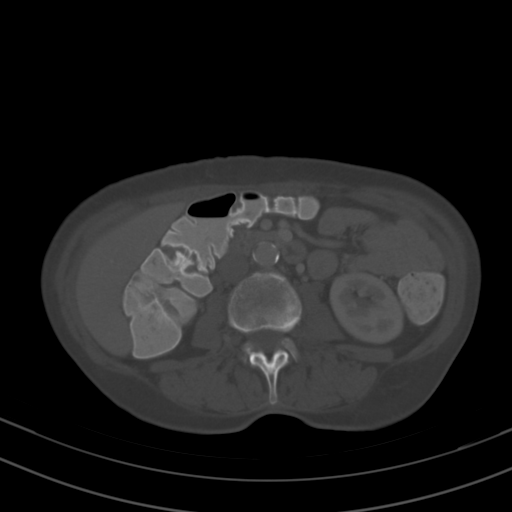
[im 63/90  soft-tissue]
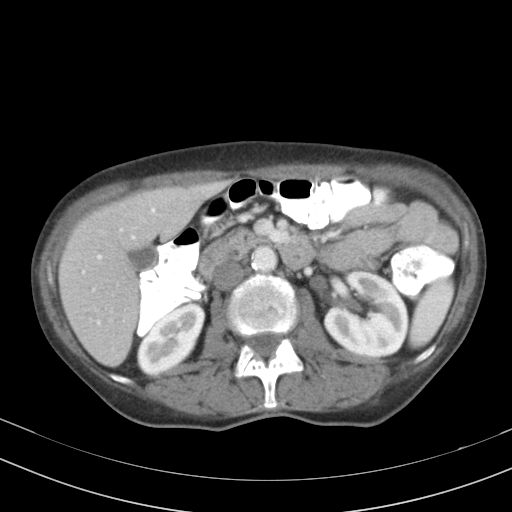
[im 72/90  soft-tissue]
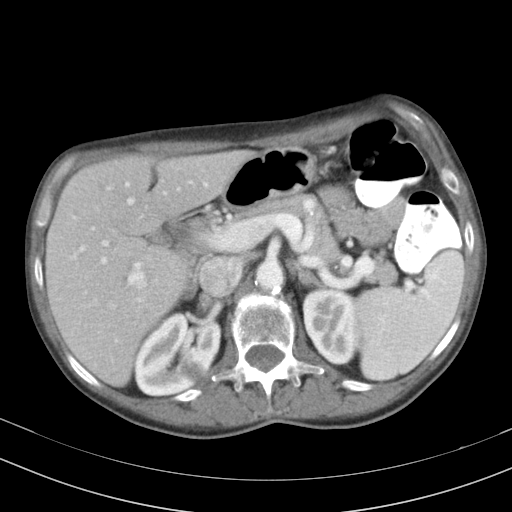
[im 72/90  lung]
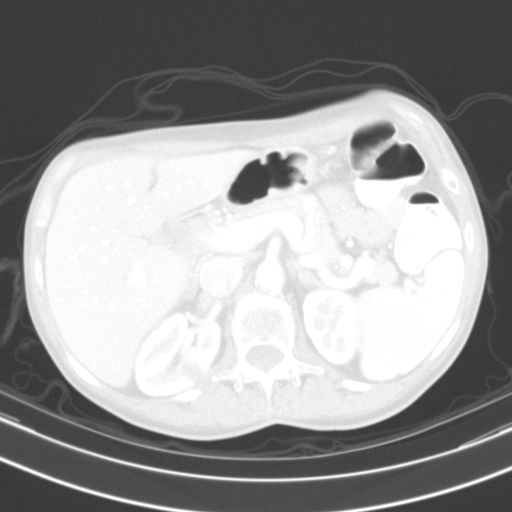
[im 76/90  soft-tissue]
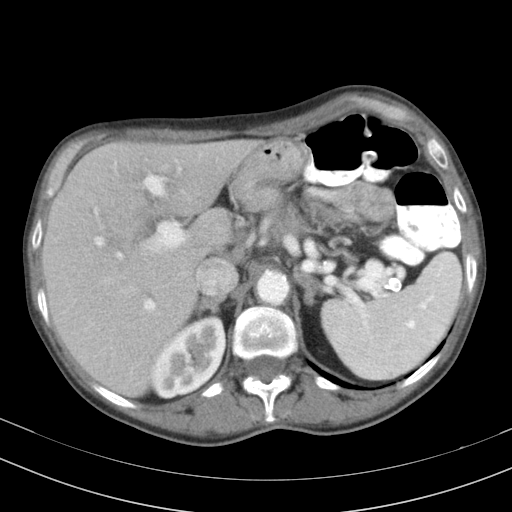
[im 76/90  lung]
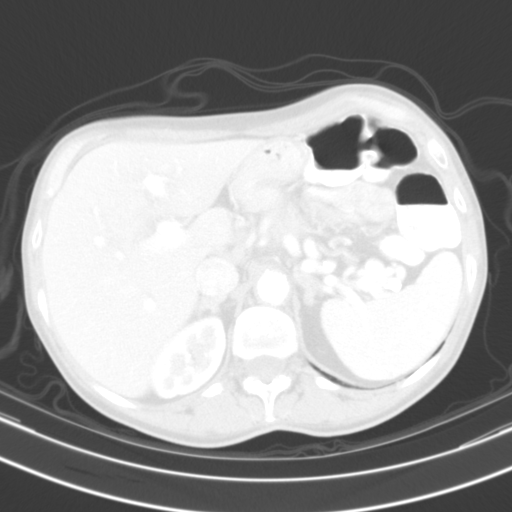
[im 81/90  lung]
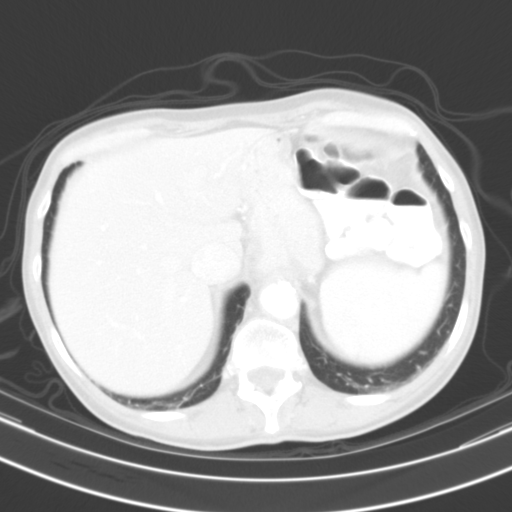
[im 85/90  soft-tissue]
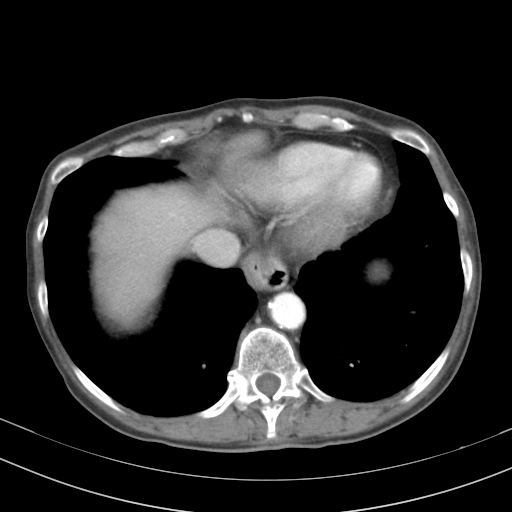
[im 85/90  lung]
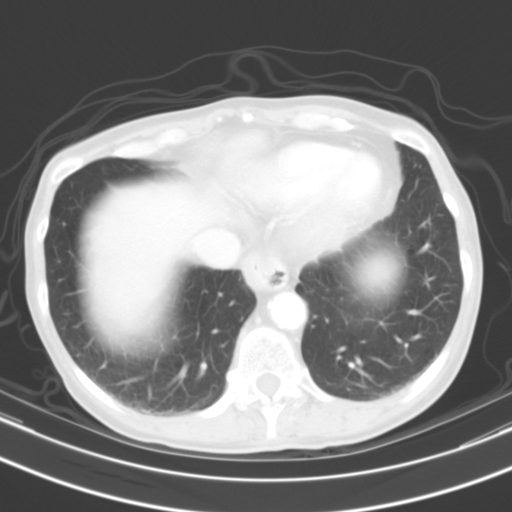

[14 of 32 positions shown; findings below may reference images not displayed]

FINDINGS: Lower chest: Centrilobular emphysema. Right coronary artery
atherosclerosis. Small type 1 hiatal hernia.

Hepatobiliary: Unremarkable

Pancreas: Unremarkable

Spleen: Unremarkable

Adrenals/Urinary Tract: Stable fullness of both adrenal glands
without discrete mass. Stable right kidney upper pole cyst. Urinary
bladder unremarkable.

Stomach/Bowel: Unremarkable

Vascular/Lymphatic: Aortoiliac atherosclerotic vascular disease.
Chronic mural thrombus posteriorly is below the level of the renal
arteries on images 23-24 series 2 no pathologic adenopathy observed.

Reproductive: Unremarkable

Other: No supplemental non-categorized findings.

Musculoskeletal: Degenerative disc disease with loss of disc height
at L5-S1. Schmorl's node along the superior endplate of L2.
IMPRESSION: 1. A cause for the patient's radiating abdominal pain is not
identified.
2. Small type 1 hiatal hernia.
3. Aortic Atherosclerosis ([id number]-[id number].G) and Emphysema ([id number].S).
Right coronary artery atherosclerosis.

## 2019-09-06 ENCOUNTER — Other Ambulatory Visit: Payer: Self-pay

## 2019-09-06 ENCOUNTER — Ambulatory Visit (INDEPENDENT_AMBULATORY_CARE_PROVIDER_SITE_OTHER): Payer: Medicare Other | Admitting: Cardiology

## 2019-09-06 ENCOUNTER — Ambulatory Visit (HOSPITAL_COMMUNITY): Payer: Medicare Other

## 2019-09-06 ENCOUNTER — Telehealth: Payer: Self-pay | Admitting: Cardiology

## 2019-09-06 ENCOUNTER — Encounter: Payer: Self-pay | Admitting: Cardiology

## 2019-09-06 ENCOUNTER — Other Ambulatory Visit: Payer: Medicare Other

## 2019-09-06 VITALS — BP 128/72 | HR 75 | Ht 65.0 in | Wt 130.8 lb

## 2019-09-06 DIAGNOSIS — R42 Dizziness and giddiness: Secondary | ICD-10-CM | POA: Diagnosis not present

## 2019-09-06 DIAGNOSIS — Z9861 Coronary angioplasty status: Secondary | ICD-10-CM | POA: Diagnosis not present

## 2019-09-06 DIAGNOSIS — I2510 Atherosclerotic heart disease of native coronary artery without angina pectoris: Secondary | ICD-10-CM

## 2019-09-06 DIAGNOSIS — I951 Orthostatic hypotension: Secondary | ICD-10-CM | POA: Diagnosis not present

## 2019-09-06 DIAGNOSIS — M79604 Pain in right leg: Secondary | ICD-10-CM | POA: Diagnosis not present

## 2019-09-06 DIAGNOSIS — R296 Repeated falls: Secondary | ICD-10-CM | POA: Diagnosis not present

## 2019-09-06 DIAGNOSIS — E785 Hyperlipidemia, unspecified: Secondary | ICD-10-CM | POA: Diagnosis not present

## 2019-09-06 DIAGNOSIS — F329 Major depressive disorder, single episode, unspecified: Secondary | ICD-10-CM

## 2019-09-06 DIAGNOSIS — F32A Depression, unspecified: Secondary | ICD-10-CM

## 2019-09-06 DIAGNOSIS — I714 Abdominal aortic aneurysm, without rupture, unspecified: Secondary | ICD-10-CM

## 2019-09-06 DIAGNOSIS — R002 Palpitations: Secondary | ICD-10-CM

## 2019-09-06 DIAGNOSIS — M79605 Pain in left leg: Secondary | ICD-10-CM | POA: Diagnosis not present

## 2019-09-06 MED ORDER — MECLIZINE HCL 12.5 MG PO TABS: 12.5000 mg | ORAL_TABLET | Freq: Three times a day (TID) | ORAL | 6 refills | Status: DC | PRN
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
## 2019-09-06 NOTE — Patient Instructions (Addendum)
Medication Instructions:  Midodrine  Take if blood pressure   less than 100/? Systolic  ( top number) , take this medication  If your are dizzy then take  Meclizine  Meclizine 12.5 mg   May take 1 to 3 tablets a day    *If you need a refill on your cardiac medications before your next appointment, please call your pharmacy*   Lab Work: Not neeede   Testing/Procedures: Not needed   Follow-Up: At [facility], you and your health needs are our priority.  As part of our continuing mission to provide you with exceptional heart care, we have created designated Provider Care Teams.  These Care Teams include your primary Cardiologist (physician) and Advanced Practice Providers (APPs -  Physician Assistants and Nurse Practitioners) who all work together to provide you with the care you need, when you need it.    Your next appointment:   6 month(s)  The format for your next appointment:   In Person  Provider:   Jory Sims, DNP, ANP   Other Instructions

## 2019-09-06 NOTE — Telephone Encounter (Signed)
Patient wants to know if her husband can come with her to today's appt with Dr. [name], she is hard of hearing.  Her husband normally comes with her to all of her appts.

## 2019-09-06 NOTE — Telephone Encounter (Signed)
Appt note updated.

## 2019-09-08 DIAGNOSIS — R2689 Other abnormalities of gait and mobility: Secondary | ICD-10-CM | POA: Diagnosis not present

## 2019-09-08 DIAGNOSIS — C3490 Malignant neoplasm of unspecified part of unspecified bronchus or lung: Secondary | ICD-10-CM | POA: Diagnosis not present

## 2019-09-09 ENCOUNTER — Encounter: Payer: Self-pay | Admitting: Internal Medicine

## 2019-09-09 ENCOUNTER — Other Ambulatory Visit: Payer: Self-pay

## 2019-09-09 ENCOUNTER — Inpatient Hospital Stay: Payer: Medicare Other | Attending: Internal Medicine | Admitting: Internal Medicine

## 2019-09-09 VITALS — BP 137/73 | HR 86 | Temp 98.5°F | Resp 18 | Ht 65.0 in | Wt 130.4 lb

## 2019-09-09 DIAGNOSIS — C3491 Malignant neoplasm of unspecified part of right bronchus or lung: Secondary | ICD-10-CM

## 2019-09-09 DIAGNOSIS — Z9221 Personal history of antineoplastic chemotherapy: Secondary | ICD-10-CM | POA: Diagnosis not present

## 2019-09-09 DIAGNOSIS — I2510 Atherosclerotic heart disease of native coronary artery without angina pectoris: Secondary | ICD-10-CM

## 2019-09-09 DIAGNOSIS — Z85118 Personal history of other malignant neoplasm of bronchus and lung: Secondary | ICD-10-CM | POA: Insufficient documentation

## 2019-09-09 DIAGNOSIS — F411 Generalized anxiety disorder: Secondary | ICD-10-CM | POA: Diagnosis not present

## 2019-09-09 DIAGNOSIS — C771 Secondary and unspecified malignant neoplasm of intrathoracic lymph nodes: Secondary | ICD-10-CM

## 2019-09-09 DIAGNOSIS — R05 Cough: Secondary | ICD-10-CM | POA: Diagnosis not present

## 2019-09-09 DIAGNOSIS — H9209 Otalgia, unspecified ear: Secondary | ICD-10-CM | POA: Insufficient documentation

## 2019-09-09 DIAGNOSIS — Z923 Personal history of irradiation: Secondary | ICD-10-CM | POA: Insufficient documentation

## 2019-09-09 DIAGNOSIS — Z9861 Coronary angioplasty status: Secondary | ICD-10-CM

## 2019-09-09 DIAGNOSIS — R519 Headache, unspecified: Secondary | ICD-10-CM | POA: Insufficient documentation

## 2019-09-09 DIAGNOSIS — R059 Cough, unspecified: Secondary | ICD-10-CM
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
## 2019-09-09 NOTE — Patient Instructions (Signed)
Steps to Quit Smoking Smoking tobacco is the leading cause of preventable death. It can affect almost every organ in the body. Smoking puts you and people around you at risk for many serious, long-lasting (chronic) diseases. Quitting smoking can be hard, but it is one of the best things that you can do for your health. It is never too late to quit. How do I get ready to quit? When you decide to quit smoking, make a plan to help you succeed. Before you quit:  Pick a date to quit. Set a date within the next 2 weeks to give you time to prepare.  Write down the reasons why you are quitting. Keep this list in places where you will see it often.  Tell your family, friends, and co-workers that you are quitting. Their support is important.  Talk with your doctor about the choices that may help you quit.  Find out if your health insurance will pay for these treatments.  Know the people, places, things, and activities that make you want to smoke (triggers). Avoid them. What first steps can I take to quit smoking?  Throw away all cigarettes at home, at work, and in your car.  Throw away the things that you use when you smoke, such as ashtrays and lighters.  Clean your car. Make sure to empty the ashtray.  Clean your home, including curtains and carpets. What can I do to help me quit smoking? Talk with your doctor about taking medicines and seeing a counselor at the same time. You are more likely to succeed when you do both.  If you are pregnant or breastfeeding, talk with your doctor about counseling or other ways to quit smoking. Do not take medicine to help you quit smoking unless your doctor tells you to do so. To quit smoking: Quit right away  Quit smoking totally, instead of slowly cutting back on how much you smoke over a period of time.  Go to counseling. You are more likely to quit if you go to counseling sessions regularly. Take medicine You may take medicines to help you quit. Some  medicines need a prescription, and some you can buy over-the-counter. Some medicines may contain a drug called nicotine to replace the nicotine in cigarettes. Medicines may:  Help you to stop having the desire to smoke (cravings).  Help to stop the problems that come when you stop smoking (withdrawal symptoms). Your doctor may ask you to use:  Nicotine patches, gum, or lozenges.  Nicotine inhalers or sprays.  Non-nicotine medicine that is taken by mouth. Find resources Find resources and other ways to help you quit smoking and remain smoke-free after you quit. These resources are most helpful when you use them often. They include:  Online chats with a counselor.  Phone quitlines.  Printed self-help materials.  Support groups or group counseling.  Text messaging programs.  Mobile phone apps. Use apps on your mobile phone or tablet that can help you stick to your quit plan. There are many free apps for mobile phones and tablets as well as websites. Examples include Quit Guide from the CDC and smokefree.gov  What things can I do to make it easier to quit?   Talk to your family and friends. Ask them to support and encourage you.  Call a phone quitline (1-800-QUIT-NOW), reach out to support groups, or work with a counselor.  Ask people who smoke to not smoke around you.  Avoid places that make you want to smoke,   such as: ? Bars. ? Parties. ? Smoke-break areas at work.  Spend time with people who do not smoke.  Lower the stress in your life. Stress can make you want to smoke. Try these things to help your stress: ? Getting regular exercise. ? Doing deep-breathing exercises. ? Doing yoga. ? Meditating. ? Doing a body scan. To do this, close your eyes, focus on one area of your body at a time from head to toe. Notice which parts of your body are tense. Try to relax the muscles in those areas. How will I feel when I quit smoking? Day 1 to 3 weeks Within the first 24 hours,  you may start to have some problems that come from quitting tobacco. These problems are very bad 2-3 days after you quit, but they do not often last for more than 2-3 weeks. You may get these symptoms:  Mood swings.  Feeling restless, nervous, angry, or annoyed.  Trouble concentrating.  Dizziness.  Strong desire for high-sugar foods and nicotine.  Weight gain.  Trouble pooping (constipation).  Feeling like you may vomit (nausea).  Coughing or a sore throat.  Changes in how the medicines that you take for other issues work in your body.  Depression.  Trouble sleeping (insomnia). Week 3 and afterward After the first 2-3 weeks of quitting, you may start to notice more positive results, such as:  Better sense of smell and taste.  Less coughing and sore throat.  Slower heart rate.  Lower blood pressure.  Clearer skin.  Better breathing.  Fewer sick days. Quitting smoking can be hard. Do not give up if you fail the first time. Some people need to try a few times before they succeed. Do your best to stick to your quit plan, and talk with your doctor if you have any questions or concerns. Summary  Smoking tobacco is the leading cause of preventable death. Quitting smoking can be hard, but it is one of the best things that you can do for your health.  When you decide to quit smoking, make a plan to help you succeed.  Quit smoking right away, not slowly over a period of time.  When you start quitting, seek help from your doctor, family, or friends. This information is not intended to replace advice given to you by your health care provider. Make sure you discuss any questions you have with your health care provider. Document Revised: 02/12/2019 Document Reviewed: 08/08/2018 Elsevier Patient Education  2020 [employer]  

## 2019-09-10 ENCOUNTER — Telehealth: Payer: Self-pay | Admitting: Internal Medicine

## 2019-09-10 NOTE — Telephone Encounter (Signed)
Scheduled per los. Called and left msg. Mailed printout  °

## 2019-09-12 IMAGING — MR MR HEAD WO/W CM
11 series · 48 of 48 positions shown · IV contrast (multihance)
Comparison: 01/16/2017

CLINICAL DATA: Small-cell lung cancer. Whole-brain radiation
therapy [DATE]. Headaches, imbalance, and weight
loss.

Creatinine was obtained on site at [HOSPITAL] at [HOSPITAL].
Results: Creatinine 0.6 mg/dL.
EXAM:
MRI HEAD WITHOUT AND WITH CONTRAST
TECHNIQUE: Multiplanar, multiecho pulse sequences of the brain and surrounding
structures were obtained without and with intravenous contrast.
CONTRAST:  10mL MULTIHANCE GADOBENATE DIMEGLUMINE 529 MG/ML IV SOLN

[Series 2: FLAIR · sagittal · 3.0mm · 0.75mm/px · 2 of 39 slices shown (1 of 2)]
[im 1/39]
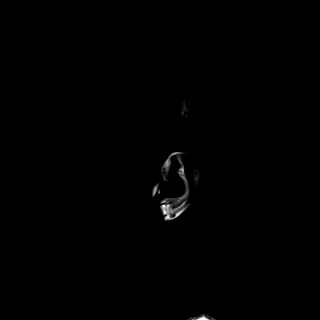
[im 39/39]
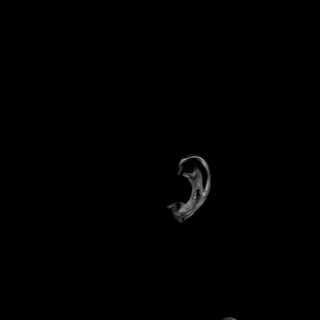

[Series 3: DWI · axial · 3.0mm · 1.50mm/px · z∈[-14,+141]mm · 4 of 82 slices shown (1 of 2)]
[im 1/82]
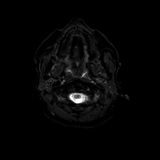
[im 28/82]
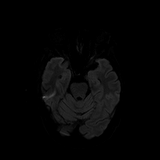
[im 55/82]
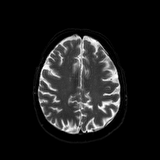
[im 82/82]
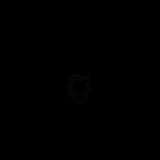

[Series 4: DWI · axial · 3.0mm · 1.50mm/px · z∈[-14,+141]mm · 3 of 41 slices shown (2 of 2)]
[im 1/41]
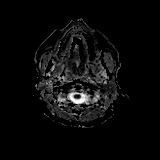
[im 21/41]
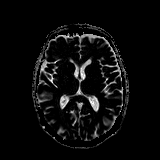
[im 41/41]
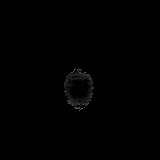

[Series 5: T2 · axial · 5.0mm · 0.57mm/px · z∈[-15,+140]mm · 2 of 27 slices shown]
[im 1/27]
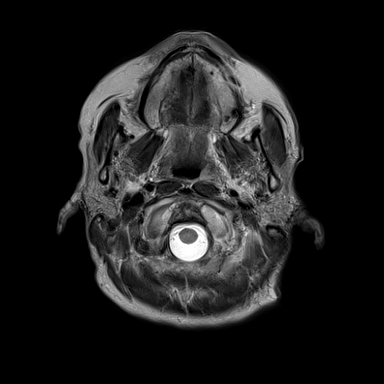
[im 27/27]
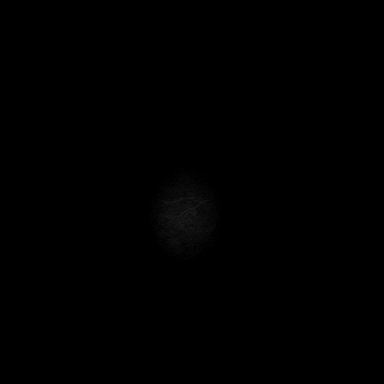

[Series 6: GRE · axial · 5.0mm · 0.57mm/px · z∈[-15,+140]mm · 2 of 27 slices shown]
[im 1/27]
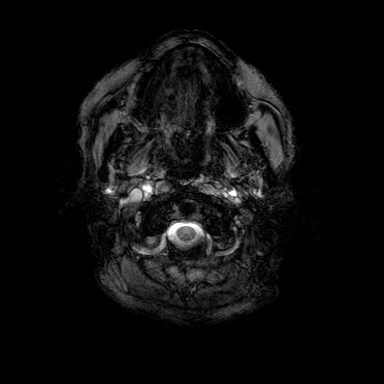
[im 27/27]
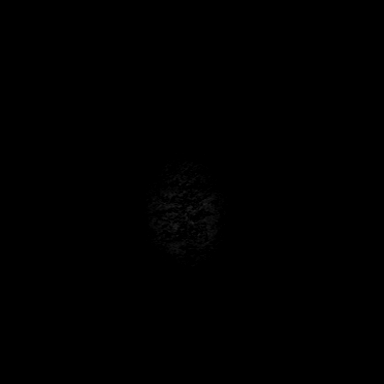

[Series 7: FLAIR · axial · 3.0mm · 0.57mm/px · z∈[-32,+127]mm · 4 of 54 slices shown (2 of 2)]
[im 1/54]
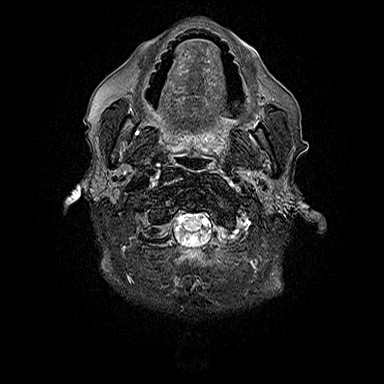
[im 18/54]
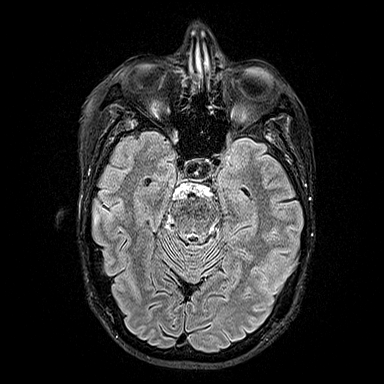
[im 36/54]
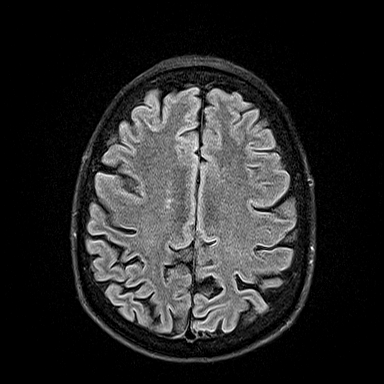
[im 54/54]
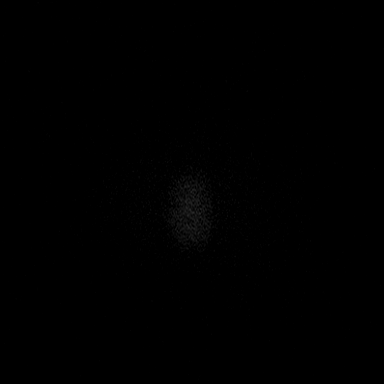

[Series 8: T1 · axial · 1.0mm · 0.75mm/px · z∈[-32,+127]mm · 11 of 160 slices shown]
[im 1/160]
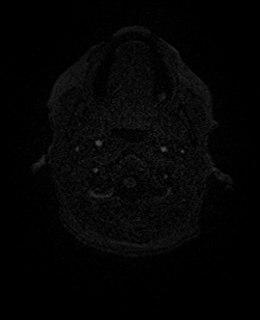
[im 16/160]
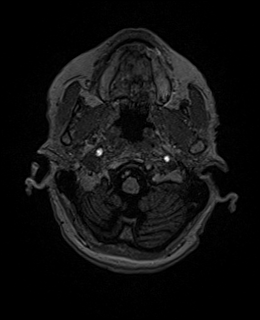
[im 32/160]
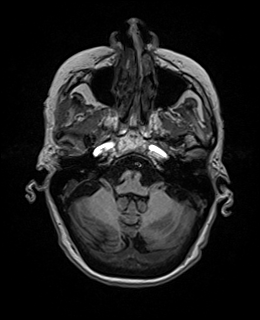
[im 48/160]
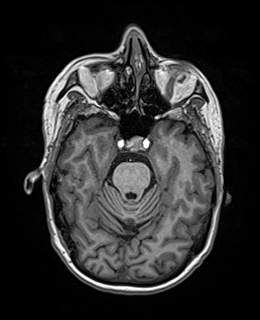
[im 64/160]
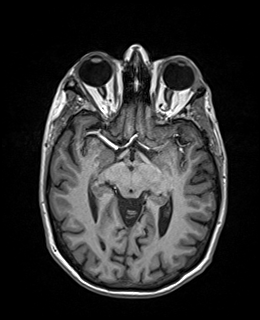
[im 80/160]
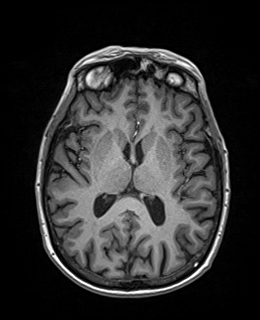
[im 96/160]
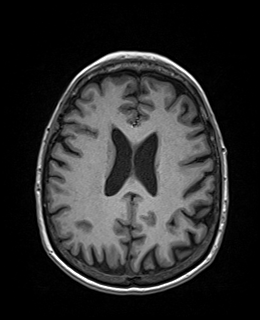
[im 112/160]
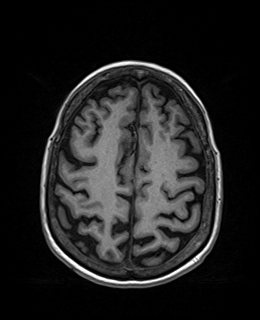
[im 128/160]
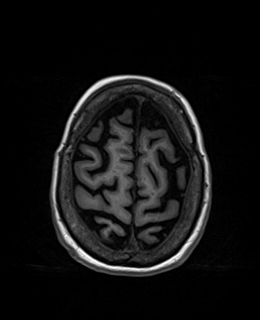
[im 144/160]
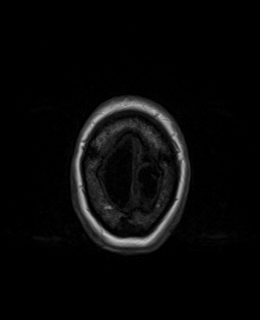
[im 160/160]
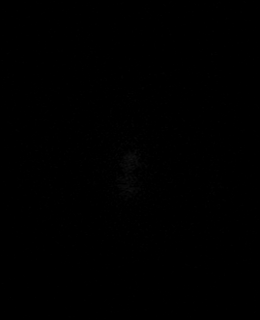

[Series 9: T2 post-contrast · coronal · 3.0mm · 0.57mm/px · 3 of 47 slices shown]
[im 1/47]
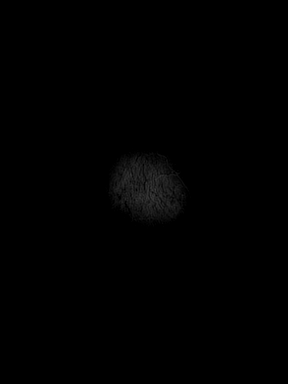
[im 24/47]
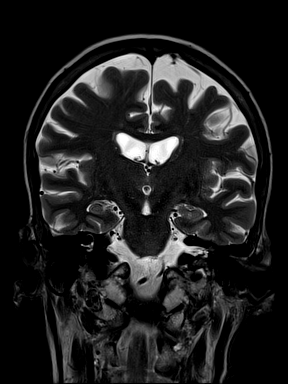
[im 47/47]
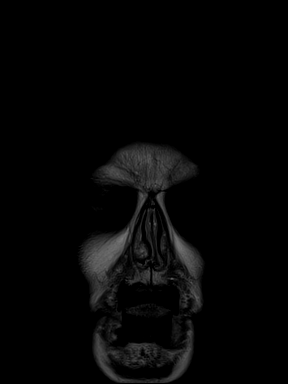

[Series 10: T1 post-contrast · axial · 1.0mm · 0.75mm/px · z∈[-32,+127]mm · 11 of 160 slices shown (1 of 2)]
[im 1/160]
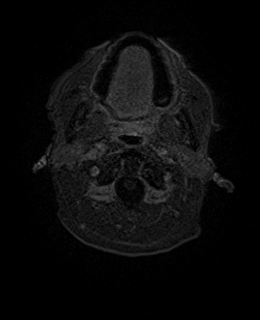
[im 16/160]
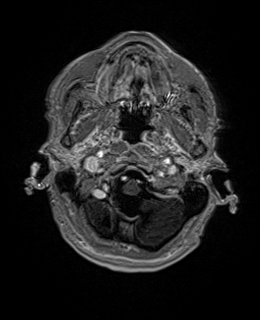
[im 32/160]
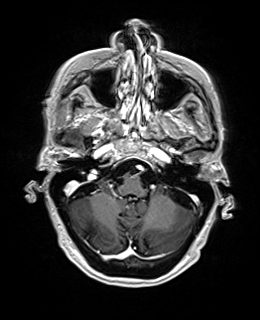
[im 48/160]
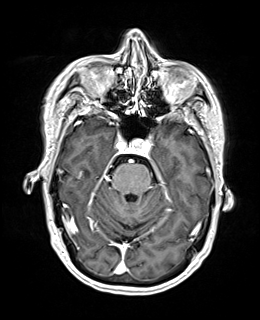
[im 64/160]
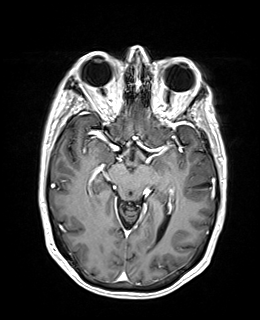
[im 80/160]
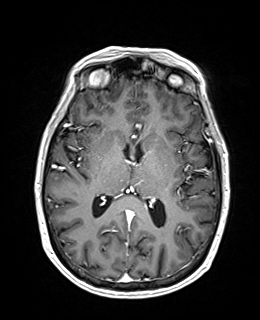
[im 96/160]
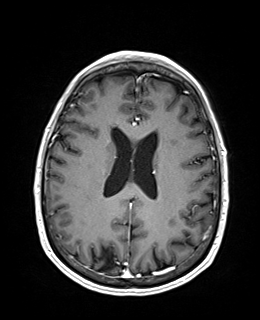
[im 112/160]
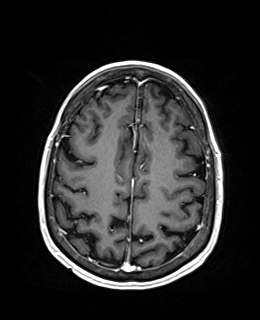
[im 128/160]
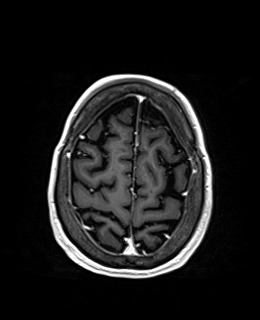
[im 144/160]
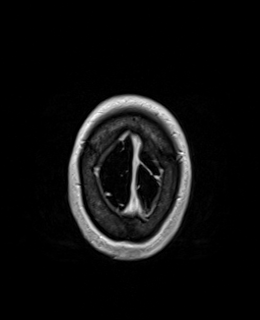
[im 160/160]
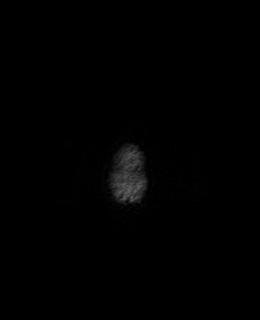

[Series 12: FLAIR post-contrast · sagittal · 3.0mm · 0.75mm/px · 3 of 39 slices shown]
[im 1/39]
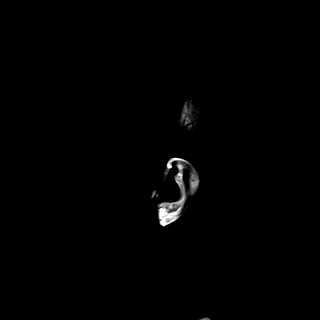
[im 20/39]
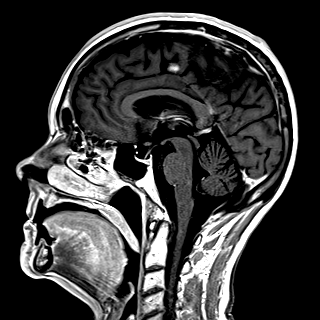
[im 39/39]
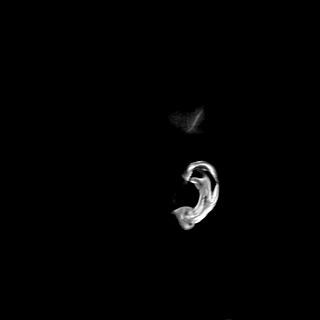

[Series 13: T1 post-contrast · coronal · 3.0mm · 0.57mm/px · 3 of 47 slices shown (2 of 2)]
[im 1/47]
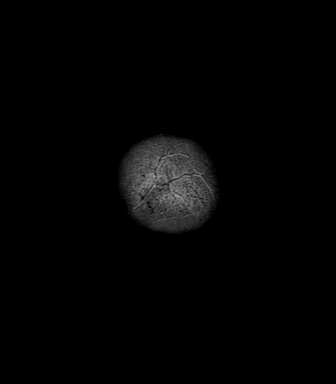
[im 24/47]
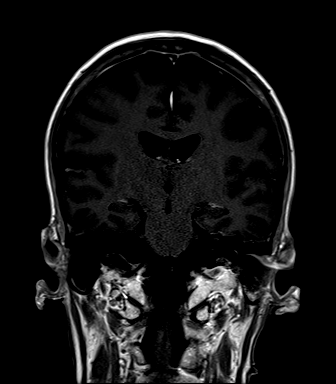
[im 47/47]
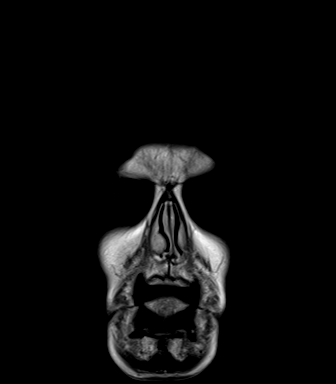

[48 of 48 positions shown; findings below may reference images not displayed]

FINDINGS: Brain: There is no evidence of acute infarct, intracranial
hemorrhage, mass, midline shift, or extra-axial fluid collection.
Scattered punctate foci of cerebral white matter T2 hyperintensity
are unchanged and nonspecific but compatible with minimal chronic
small vessel ischemic disease. The ventricles and sulci are within
normal limits for age. No abnormal enhancement is identified. A
partially empty sella is again noted.

Vascular: Major intracranial vascular flow voids are preserved.

Skull and upper cervical spine: Unremarkable marrow signal.

Sinuses/Orbits: Unremarkable orbits. Trace left mastoid effusion,
decreased from prior. Clear paranasal sinuses.

Other: None.
IMPRESSION: No evidence of intracranial metastases or acute abnormality.

## 2019-09-13 ENCOUNTER — Encounter: Payer: Self-pay | Admitting: Cardiology

## 2019-09-13 NOTE — Assessment & Plan Note (Addendum)
Remains on pravastatin.  Labs have not been checked in a while.  Will have her come in for Lipids & CMP prior to next visit.

## 2019-09-13 NOTE — Assessment & Plan Note (Signed)
Yet again she is worn a monitor that did not show any significant findings.  Short little run of SVT that was not symptomatic.  Only 5 beats.  At this point I do not want to do any more testing on her unless she has a documented evidence of tachycardia spell somehow else I would not do another monitor on her.  We can treat simple palpitations because we have not been able to put her on a beta-blocker secondary to hypotension episodes.  I reiterated this with her, and told her that simply this has to do with anxiety.

## 2019-09-13 NOTE — Assessment & Plan Note (Signed)
Continue to recommend adequate hydration.  She has midodrine standing by if she has low blood pressures with dizziness.  No blood pressure medications.

## 2019-09-13 NOTE — Assessment & Plan Note (Signed)
No evidence of AAA on recent abdominal CT.

## 2019-09-13 NOTE — Assessment & Plan Note (Addendum)
The dizziness she is having now seems to be more related to vertigo.  Plan will be for her to measure blood pressures if she is dizzy.  If blood pressure is well, she will hydrate intake and midodrine.  If blood pressure is normal, she will take meclizine which we are providing her today.  I also suggested that she walk with a 4 pronged cane.  Prescription written.

## 2019-09-13 NOTE — Assessment & Plan Note (Signed)
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   Okay to hold Plavix for procedures or surgeries.  (5-7 days preop)  Mostly because of hypotension issues.  She is no longer on beta-blocker or ARB.  Is on low-dose Imdur.  She is also on pravastatin which is as much as she can tolerate.

## 2019-09-13 NOTE — Assessment & Plan Note (Signed)
We will reiterate again to her PCP that I think she needs to be on a long-term SSRI or SNRI to treat her anxiety and depression.  I do think that a benzodiazepine is a right answer for her.  Seems strange that she is going to tolerate gabapentin for neuropathy, but would not take an SSRI.

## 2019-09-14 DIAGNOSIS — H04123 Dry eye syndrome of bilateral lacrimal glands: Secondary | ICD-10-CM | POA: Diagnosis not present

## 2019-09-14 DIAGNOSIS — H0102B Squamous blepharitis left eye, upper and lower eyelids: Secondary | ICD-10-CM | POA: Diagnosis not present

## 2019-09-14 DIAGNOSIS — H0102A Squamous blepharitis right eye, upper and lower eyelids: Secondary | ICD-10-CM | POA: Diagnosis not present

## 2019-09-18 DIAGNOSIS — Z23 Encounter for immunization: Secondary | ICD-10-CM | POA: Diagnosis not present

## 2019-09-19 ENCOUNTER — Other Ambulatory Visit: Payer: Self-pay | Admitting: Pulmonary Disease

## 2019-09-21 ENCOUNTER — Other Ambulatory Visit: Payer: Self-pay

## 2019-09-21 ENCOUNTER — Ambulatory Visit (HOSPITAL_COMMUNITY)
Admission: RE | Admit: 2019-09-21 | Discharge: 2019-09-21 | Disposition: A | Discharge status: Home / Self Care | Payer: Medicare Other | Source: Ambulatory Visit | Attending: Internal Medicine | Admitting: Internal Medicine

## 2019-09-21 DIAGNOSIS — J439 Emphysema, unspecified: Secondary | ICD-10-CM | POA: Diagnosis not present

## 2019-09-21 DIAGNOSIS — J449 Chronic obstructive pulmonary disease, unspecified: Secondary | ICD-10-CM | POA: Diagnosis not present

## 2019-09-21 DIAGNOSIS — C3491 Malignant neoplasm of unspecified part of right bronchus or lung: Secondary | ICD-10-CM | POA: Diagnosis not present

## 2019-09-21 DIAGNOSIS — I2510 Atherosclerotic heart disease of native coronary artery without angina pectoris: Secondary | ICD-10-CM | POA: Diagnosis not present

## 2019-09-21 DIAGNOSIS — C3490 Malignant neoplasm of unspecified part of unspecified bronchus or lung: Secondary | ICD-10-CM | POA: Diagnosis not present

## 2019-09-21 DIAGNOSIS — I700 Atherosclerosis of aorta: Secondary | ICD-10-CM | POA: Diagnosis not present

## 2019-09-21 DIAGNOSIS — J441 Chronic obstructive pulmonary disease with (acute) exacerbation: Secondary | ICD-10-CM | POA: Diagnosis not present

## 2019-09-21 DIAGNOSIS — E785 Hyperlipidemia, unspecified: Secondary | ICD-10-CM | POA: Diagnosis not present

## 2019-09-21 DIAGNOSIS — E7800 Pure hypercholesterolemia, unspecified: Secondary | ICD-10-CM | POA: Diagnosis not present

## 2019-09-21 LAB — GLUCOSE, CAPILLARY: Glucose-Capillary: 84 mg/dL (ref 70–99)

## 2019-09-21 MED ORDER — FLUDEOXYGLUCOSE F - 18 (FDG) INJECTION
6.5500 | Freq: Once | INTRAVENOUS | Status: AC
  Administered 2019-09-21: 6.55 via INTRAVENOUS

## 2019-09-23 ENCOUNTER — Encounter: Payer: Self-pay | Admitting: Internal Medicine

## 2019-09-23 ENCOUNTER — Inpatient Hospital Stay (HOSPITAL_BASED_OUTPATIENT_CLINIC_OR_DEPARTMENT_OTHER): Payer: Medicare Other | Admitting: Internal Medicine

## 2019-09-23 ENCOUNTER — Other Ambulatory Visit: Payer: Self-pay

## 2019-09-23 ENCOUNTER — Telehealth: Payer: Self-pay | Admitting: Internal Medicine

## 2019-09-23 VITALS — BP 133/70 | HR 97 | Temp 98.3°F | Resp 17 | Ht 65.0 in | Wt 132.7 lb

## 2019-09-23 DIAGNOSIS — R519 Headache, unspecified: Secondary | ICD-10-CM | POA: Diagnosis not present

## 2019-09-23 DIAGNOSIS — C3491 Malignant neoplasm of unspecified part of right bronchus or lung: Secondary | ICD-10-CM | POA: Diagnosis not present

## 2019-09-23 DIAGNOSIS — Z9861 Coronary angioplasty status: Secondary | ICD-10-CM

## 2019-09-23 DIAGNOSIS — C771 Secondary and unspecified malignant neoplasm of intrathoracic lymph nodes: Secondary | ICD-10-CM

## 2019-09-23 DIAGNOSIS — Z923 Personal history of irradiation: Secondary | ICD-10-CM | POA: Diagnosis not present

## 2019-09-23 DIAGNOSIS — H9209 Otalgia, unspecified ear: Secondary | ICD-10-CM | POA: Diagnosis not present

## 2019-09-23 DIAGNOSIS — I2510 Atherosclerotic heart disease of native coronary artery without angina pectoris: Secondary | ICD-10-CM | POA: Diagnosis not present

## 2019-09-23 DIAGNOSIS — Z9221 Personal history of antineoplastic chemotherapy: Secondary | ICD-10-CM | POA: Diagnosis not present

## 2019-09-23 DIAGNOSIS — F411 Generalized anxiety disorder: Secondary | ICD-10-CM

## 2019-09-23 DIAGNOSIS — Z85118 Personal history of other malignant neoplasm of bronchus and lung: Secondary | ICD-10-CM | POA: Diagnosis not present

## 2019-09-23 NOTE — Telephone Encounter (Signed)
Scheduled appt per 4/22 los - mailed reminder letter with appt date and time
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
## 2019-10-07 ENCOUNTER — Other Ambulatory Visit: Payer: Self-pay | Admitting: Family Medicine

## 2019-10-07 DIAGNOSIS — Z1231 Encounter for screening mammogram for malignant neoplasm of breast: Secondary | ICD-10-CM

## 2019-10-12 ENCOUNTER — Telehealth: Payer: Self-pay | Admitting: Cardiology

## 2019-10-12 NOTE — Telephone Encounter (Signed)
Pt c/o swelling: STAT is pt has developed SOB within 24 hours  1) How much weight have you gained and in what time span? 6 to 7 lbs in a few months   2) If swelling, where is the swelling located? Feet, left foot turns blue   3) Are you currently taking a fluid pill? No   4) Are you currently SOB? No   5) Do you have a log of your daily weights (if so, list)? No   6) Have you gained 3 pounds in a day or 5 pounds in a week? Not sure   7) Have you traveled recently? No  Michele [name] is calling stating she has been experiencing swelling in her feet. She states the swelling in her left foot is worse than her right and has caused her foot to turn blue. Michele [name] also states her BP has been running high, she also has a rash on her legs and arms that occasionally itches, and her HR is beating faster than normal especially when laying down. Michele [name] is wanting an appointment in regards to this if at all possible on Thursday 10/14/19 due to already have an Ortho appt at 10:00 AM that morning close to the office. I advised [facility] Dr. [name] does not have anything available on that day and all the PA's only have virtual available. Please advise.

## 2019-10-12 NOTE — Telephone Encounter (Signed)
Returned call to patient she stated she would like to schedule appointment in office.She has been having swelling in both feet left worse for the past 2 months.She has chest pain off and on.Stated she has had for a long time.No chest pain at present. B/P elevated.She has appointment with dermatology for a red rash on legs and arms.Appointment scheduled with Coletta Memos NP 10/22/19 at 9:15 am.

## 2019-10-14 DIAGNOSIS — M47816 Spondylosis without myelopathy or radiculopathy, lumbar region: Secondary | ICD-10-CM | POA: Diagnosis not present

## 2019-10-14 DIAGNOSIS — M25562 Pain in left knee: Secondary | ICD-10-CM | POA: Diagnosis not present

## 2019-10-14 DIAGNOSIS — M47896 Other spondylosis, lumbar region: Secondary | ICD-10-CM | POA: Diagnosis not present

## 2019-10-14 DIAGNOSIS — M545 Low back pain: Secondary | ICD-10-CM | POA: Diagnosis not present

## 2019-10-14 DIAGNOSIS — M79672 Pain in left foot: Secondary | ICD-10-CM | POA: Diagnosis not present

## 2019-10-18 ENCOUNTER — Ambulatory Visit (INDEPENDENT_AMBULATORY_CARE_PROVIDER_SITE_OTHER): Payer: Medicare Other | Admitting: Pulmonary Disease

## 2019-10-18 ENCOUNTER — Other Ambulatory Visit: Payer: Self-pay

## 2019-10-18 ENCOUNTER — Encounter: Payer: Self-pay | Admitting: Pulmonary Disease

## 2019-10-18 VITALS — BP 124/62 | HR 77 | Temp 98.4°F | Ht 65.5 in | Wt 133.4 lb

## 2019-10-18 DIAGNOSIS — J479 Bronchiectasis, uncomplicated: Secondary | ICD-10-CM | POA: Diagnosis not present

## 2019-10-18 DIAGNOSIS — J439 Emphysema, unspecified: Secondary | ICD-10-CM

## 2019-10-18 DIAGNOSIS — J449 Chronic obstructive pulmonary disease, unspecified: Secondary | ICD-10-CM | POA: Diagnosis not present

## 2019-10-18 DIAGNOSIS — Z9861 Coronary angioplasty status: Secondary | ICD-10-CM | POA: Diagnosis not present

## 2019-10-18 DIAGNOSIS — I2510 Atherosclerotic heart disease of native coronary artery without angina pectoris: Secondary | ICD-10-CM

## 2019-10-18 DIAGNOSIS — J0100 Acute maxillary sinusitis, unspecified: Secondary | ICD-10-CM

## 2019-10-18 DIAGNOSIS — J440 Chronic obstructive pulmonary disease with acute lower respiratory infection: Secondary | ICD-10-CM

## 2019-10-18 DIAGNOSIS — J209 Acute bronchitis, unspecified: Secondary | ICD-10-CM | POA: Diagnosis not present

## 2019-10-18 DIAGNOSIS — Z720 Tobacco use: Secondary | ICD-10-CM | POA: Diagnosis not present

## 2019-10-18 MED ORDER — AMOXICILLIN-POT CLAVULANATE 875-125 MG PO TABS: 1.0000 | ORAL_TABLET | Freq: Two times a day (BID) | ORAL | 0 refills | Status: DC

## 2019-10-18 NOTE — Patient Instructions (Signed)
Augmentin 1 pill twice per day for 7 days  Call if you aren't feeling improvement after completing course of antibiotic  Follow up in 6 months

## 2019-10-18 NOTE — Progress Notes (Signed)
[facility]  Chief Complaint  Patient presents with  . Follow-up    Constitutional:  BP 124/62 (BP Location: Right Arm, Cuff Size: Normal)   Pulse 77   Temp 98.4 F (36.9 C) (Temporal)   Ht 5' 5.5" (1.664 m)   Wt 133 lb 6.4 oz (60.5 kg)   SpO2 99% Comment: RA  BMI 21.86 kg/m   Past Medical History:  Vitamin D deficiency, Seizures, RLS, Chronic back pain, HTN, Hiatal hernia, GERD, HLD, Palpitations, Depression, CAD, Cataracts, OA, Anxiety, COVID 22 May 2019  Brief Summary:  Michele [name] is a 64 y.o. femalesmokerwithCOPD/emphysema. 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  Subjective:  She had CT chest in April.  Concern for mediastinal fullness.  No evident on PET scan.  She has been getting sinus congestion and headache.  More on Lt side.  Has cough with thick white to yellow sputum.  Occasional blood streaking.  Continues to feel sore in her left chest.  Her husband has noticed she feels warm, and has been concerned she had fever.  She completed COVID vaccine.  She started smoking again.  Physical Exam:   Appearance - well kempt   ENMT - mild sinus tenderness, boggy nasal mucosa, no oral exudate, no LAN, Mallampati 2 airway, no stridor, poor hearing  Respiratory - decreased breath sounds, scattered rhonchi, no wheezing  CV - s1s2 regular rate and rhythm, no murmurs  Ext - no clubbing, no edema  Skin - no rashes  Psych - normal mood and affect  Assessment/Plan:   Acute sinusitis with bronchitis. - will give course of augmentin - don't think she needs chest xray or prednisone at this time  COPD with emphysema and chronic bronchitis. - intolerant of LABA's and albuterol due to palpitations - continue arnuity, spiriva - advised her to try using xopenex more often when she is having symptoms - mucinex bid, prn flutter valve  Hx of small cell lung cancer. - f/u with Dr. Julien Nordmann  Atypical  chest pain with reflux and hiatal hernia. - f/u with GI  Tobacco abuse. - asked her to try nicotine gum again  A total of  32 minutes spent addressing patient care issues on day of visit.   Follow up:   Patient Instructions  Augmentin 1 pill twice per day for 7 days  Call if you aren't feeling improvement after completing course of antibiotic  Follow up in 6 months   Signature:  [name], MD [place] Pager: [phone] 10/18/2019, 1:07 PM  Flow Sheet     Pulmonary tests:  PFT 12/23/11 >> FEV1 2.13 (85%), FEV1% 59, TLC 5.98 (112%), DLCO 60% Spirometry 11/19/17 >> FEV1 2.58 (100%), FEV1% 76 FeNO 11/19/17 >> 5  Chest imaging:  CT chest 08/24/16 >> CAD, mod/severe emphysema CT chest 08/29/17 >> severe emphysema CT chest 03/02/18 >> severe centrilobular and paraseptal emphysema, 4 mm nodule RUL partially calcified CT angio chest 04/27/18 >> atherosclerosis, small/mod hiatal hernia, dilation of esophagus with mucosal thickening, severe emphysema with large bulla, bronchiectasis CT chest 09/02/19 >> severe upper lobe predominant emphysema  Cardiac tests:  Echo 08/30/17 >> EF 60 to 65%, grade 1 DD, mild MR  Medications:   Allergies as of 10/18/2019      Reactions   Ciprofloxacin Other (See Comments)   Hypoglycemia    Aspirin Other (See Comments)   GI upset; patient is only able to take enteric coated Aspirin.  Crestor [rosuvastatin] Other (See Comments)   Muscle pain    Ibuprofen Other (See Comments)   GI upset    Wellbutrin [bupropion] Palpitations   Zoloft [sertraline Hcl] Other (See Comments)   Insomnia, fatigue    Albuterol Palpitations   Lipitor [atorvastatin] Other (See Comments)   Muscle pain    Sulfonamide Derivatives Itching, Rash      Medication List       Accurate as of Oct 18, 2019  1:07 PM. If you have any questions, ask your nurse or doctor.        STOP taking these medications   ondansetron 4 MG disintegrating tablet  Commonly known as: Zofran ODT Stopped by: [name], MD     TAKE these medications   AIRBORNE GUMMIES PO Take 2 each by mouth daily.   ALPRAZolam 1 MG tablet Commonly known as: XANAX Take 1 mg by mouth 3 (three) times daily.   amoxicillin-clavulanate 875-125 MG tablet Commonly known as: AUGMENTIN Take 1 tablet by mouth 2 (two) times daily. Started by: [name], MD   Arnuity Ellipta 100 MCG/ACT Aepb Generic drug: Fluticasone Furoate Inhale 1 puff into the lungs daily.   benzonatate 200 MG capsule Commonly known as: TESSALON Take 1 capsule (200 mg total) by mouth 3 (three) times daily as needed for cough.   CITRACAL + D PO Take 1 tablet by mouth in the morning and at bedtime.   CLARITIN-D 12 HOUR PO Take 1 tablet by mouth daily as needed.   clopidogrel 75 MG tablet Commonly known as: PLAVIX Take 1 tablet (75 mg total) by mouth every Monday, Wednesday, and Friday.   fluticasone 50 MCG/ACT nasal spray Commonly known as: FLONASE INSTILL 1 SPRAY INTO BOTH NOSTRILS DAILY   Flutter Devi 1 application by Does not apply route 2 (two) times daily.   gabapentin 100 MG capsule Commonly known as: NEURONTIN Take 100 mg by mouth 3 (three) times daily.   isosorbide mononitrate 30 MG 24 hr tablet Commonly known as: IMDUR TAKE 1 TABLET(30 MG) BY MOUTH AT BEDTIME   levalbuterol 0.63 MG/3ML nebulizer solution Commonly known as: Xopenex Take 3 mLs (0.63 mg total) by nebulization every 4 (four) hours as needed for wheezing or shortness of breath (dx: R00.2, J44.9).   levalbuterol 45 MCG/ACT inhaler Commonly known as: XOPENEX HFA INHALE 2 PUFFS INTO THE LUNGS EVERY 6 HOURS AS NEEDED FOR WHEEZING   meclizine 12.5 MG tablet Commonly known as: ANTIVERT Take 1 tablet (12.5 mg total) by mouth 3 (three) times daily as needed for dizziness.   midodrine 2.5 MG tablet Commonly known as: PROAMATINE Take 2.5 mg tablet at breakfast and dinnertime   morphine 15 MG tablet Commonly  known as: MSIR Take 0.5 tablets (7.5 mg total) by mouth every 4 (four) hours as needed for severe pain.   multivitamin with minerals Tabs tablet Take 1 tablet by mouth daily. Multivitamin for Women 50+   nitroGLYCERIN 0.4 MG SL tablet Commonly known as: Nitrostat Place 1 tablet (0.4 mg total) under the tongue every 5 (five) minutes as needed for chest pain.   pantoprazole 40 MG tablet Commonly known as: PROTONIX Take 40 mg by mouth every other day.   polyethylene glycol 17 g packet Commonly known as: MIRALAX / GLYCOLAX Take 17 g by mouth 2 (two) times daily.   pravastatin 40 MG tablet Commonly known as: PRAVACHOL TAKE 1 TABLET(40 MG) BY MOUTH EVERY EVENING   Restasis 0.05 % ophthalmic emulsion Generic drug: cycloSPORINE INSTILL 1  DROP IN BOTH EYES BID UTD   tiotropium 18 MCG inhalation capsule Commonly known as: SPIRIVA Place 18 mcg into inhaler and inhale daily.   ULTRA COQ10 PO Take 1 capsule by mouth daily.   Vascepa 1 g capsule Generic drug: icosapent Ethyl TAKE 1 CAPSULE BY MOUTH TWICE DAILY       Past Surgical History:  She  has a past surgical history that includes Leg wound repair / closure (9798); Coronary angioplasty with stent (10/10/11); Coronary angioplasty with stent (11/06/11); Tonsillectomy; Tubal ligation (1970's); Knee surgery; Coronary angioplasty with stent (11/19/2012); NM MYOVIEW LTD (October 2013; 12/2013); CPET (09/07/2012); left heart catheterization with coronary angiogram (N/A, 10/10/2011); percutaneous coronary stent intervention (pci-s) (N/A, 11/06/2011); left heart catheterization with coronary angiogram (N/A, 11/19/2012); left heart catheterization with coronary angiogram (N/A, 02/19/2013); left heart catheterization with coronary angiogram (N/A, 03/02/2014); Colonoscopy; Polypectomy; Video bronchoscopy with endobronchial ultrasound (N/A, 02/14/2016); Direct laryngoscopy (N/A, 02/14/2016); OTHER SURGICAL HISTORY; Breast biopsy (2000's); NM MYOVIEW LTD  (02/2016); lipoma surgery (Left, 10/2016); transthoracic echocardiogram (04/01/2019); transthoracic echocardiogram (08/30/2017); Esophagogastroduodenoscopy (egd) with propofol (N/A, 10/29/2018); and EVENT MONITOR (03/2019).  Family History:  Her family history includes Asthma in her mother; Cancer in her maternal grandmother; Colon polyps in her mother; Diabetes in her mother; Emphysema in her mother; Heart attack in her maternal grandfather; Heart disease in her father and mother; Hyperlipidemia in her mother; Hypertension in her mother; Kidney cancer in her brother; Stomach cancer in her brother and brother; Stroke in her mother; Stroke (age of onset: 20) in her brother; Thyroid cancer in her daughter.  Social History:  She  reports that she has been smoking cigarettes. She has a 60.00 pack-year smoking history. She has never used smokeless tobacco. She reports that she does not drink alcohol or use drugs.

## 2019-10-20 DIAGNOSIS — L538 Other specified erythematous conditions: Secondary | ICD-10-CM | POA: Diagnosis not present

## 2019-10-20 DIAGNOSIS — L821 Other seborrheic keratosis: Secondary | ICD-10-CM | POA: Diagnosis not present

## 2019-10-20 DIAGNOSIS — L281 Prurigo nodularis: Secondary | ICD-10-CM | POA: Diagnosis not present

## 2019-10-20 DIAGNOSIS — D2362 Other benign neoplasm of skin of left upper limb, including shoulder: Secondary | ICD-10-CM | POA: Diagnosis not present

## 2019-10-20 DIAGNOSIS — L298 Other pruritus: Secondary | ICD-10-CM | POA: Diagnosis not present
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
## 2019-10-22 ENCOUNTER — Encounter: Payer: Self-pay | Admitting: General Practice

## 2019-10-22 ENCOUNTER — Other Ambulatory Visit: Payer: Self-pay

## 2019-10-22 ENCOUNTER — Ambulatory Visit (INDEPENDENT_AMBULATORY_CARE_PROVIDER_SITE_OTHER): Payer: Medicare Other | Admitting: General Practice

## 2019-10-22 VITALS — BP 115/73 | HR 90 | Temp 98.1°F | Ht 65.0 in | Wt 132.4 lb

## 2019-10-22 DIAGNOSIS — I2510 Atherosclerotic heart disease of native coronary artery without angina pectoris: Secondary | ICD-10-CM | POA: Diagnosis not present

## 2019-10-22 DIAGNOSIS — F411 Generalized anxiety disorder: Secondary | ICD-10-CM | POA: Diagnosis not present

## 2019-10-22 DIAGNOSIS — U071 COVID-19: Secondary | ICD-10-CM | POA: Diagnosis not present

## 2019-10-22 DIAGNOSIS — R002 Palpitations: Secondary | ICD-10-CM | POA: Diagnosis not present

## 2019-10-22 DIAGNOSIS — R600 Localized edema: Secondary | ICD-10-CM

## 2019-10-22 DIAGNOSIS — M791 Myalgia, unspecified site: Secondary | ICD-10-CM | POA: Diagnosis not present

## 2019-10-22 DIAGNOSIS — M545 Low back pain: Secondary | ICD-10-CM | POA: Diagnosis not present

## 2019-10-22 DIAGNOSIS — R42 Dizziness and giddiness: Secondary | ICD-10-CM | POA: Diagnosis not present

## 2019-10-22 DIAGNOSIS — Z9861 Coronary angioplasty status: Secondary | ICD-10-CM

## 2019-10-22 DIAGNOSIS — M5136 Other intervertebral disc degeneration, lumbar region: Secondary | ICD-10-CM | POA: Diagnosis not present

## 2019-10-22 DIAGNOSIS — I951 Orthostatic hypotension: Secondary | ICD-10-CM

## 2019-10-22 NOTE — Patient Instructions (Signed)
Medication Instructions:  The current medical regimen is effective;  continue present plan and medications as directed. Please refer to the Current Medication list given to you today. *If you need a refill on your cardiac medications before your next appointment, please call your pharmacy*  Special Instructions PLEASE PURCHASE AND WEAR COMPRESSION STOCKINGS DAILY AND OFF AT BEDTIME. Compression stockings are elastic socks that squeeze the legs. They help to increase blood flow to the legs and to decrease swelling in the legs from fluid retention, and reduce the chance of developing blood clots in the lower legs. Please put on in the AM when dressing and off at night when dressing for bed. PLEASE MAKE SURE TO ELEVATE YOU LEGS WHILE SITTING, THIS WILL HELP WITH THE SWELLING ALSO.  PLEASE READ AND FOLLOW SALTY 6-ATTACHED  Follow-Up: Your next appointment:  6 month(s) Please call our office 2 months in advance to schedule this appointment In Person with You may see Glenetta Hew, MD Coletta Memos, FNP-C or one of the following Advanced Practice Providers on your designated Care Team:  Rosaria Ferries, PA-C  Jory Sims, DNP, ANP  Cadence Kathlen Mody, NP  At [facility], you and your health needs are our priority.  As part of our continuing mission to provide you with exceptional heart care, we have created designated Provider Care Teams.  These Care Teams include your primary Cardiologist (physician) and Advanced Practice Providers (APPs -  Physician Assistants and Nurse Practitioners) who all work together to provide you with the care you need, when you need it.

## 2019-10-25 ENCOUNTER — Telehealth: Payer: Self-pay | Admitting: Cardiology

## 2019-10-25 NOTE — Telephone Encounter (Signed)
Discussed  With pt, informed pt to get OTC compression stockings and to discuss thighs with PCP

## 2019-10-25 NOTE — Telephone Encounter (Signed)
Patient is calling to follow up in regards to appointment completed on 10/22/19 with Coletta Memos. She states he ordered compression stockings. However, he did not provide the compression level needed to pick up order. She states she would also like to discuss knots the has on her thighs, as her legs were not examined above the knees during her appointment. Please call.

## 2019-10-28 ENCOUNTER — Telehealth: Payer: Self-pay | Admitting: Pulmonary Disease

## 2019-10-28 NOTE — Telephone Encounter (Signed)
Patient directed to call PCP regarding her vaginal yeast infection. She reports she has multiple vaginal yeast infection. Patient also directed to contact her GYN for history of multiple fungal infections.

## 2019-10-31 ENCOUNTER — Encounter (HOSPITAL_COMMUNITY): Payer: Self-pay | Admitting: Emergency Medicine

## 2019-10-31 ENCOUNTER — Emergency Department (HOSPITAL_COMMUNITY)
Admission: EM | Admit: 2019-10-31 | Discharge: 2019-11-01 | Disposition: A | Discharge status: Home / Self Care | Payer: Medicare Other | Attending: Emergency Medicine | Admitting: Emergency Medicine

## 2019-10-31 DIAGNOSIS — R2681 Unsteadiness on feet: Secondary | ICD-10-CM | POA: Diagnosis not present

## 2019-10-31 DIAGNOSIS — R296 Repeated falls: Secondary | ICD-10-CM | POA: Diagnosis not present

## 2019-10-31 DIAGNOSIS — Y92019 Unspecified place in single-family (private) house as the place of occurrence of the external cause: Secondary | ICD-10-CM | POA: Insufficient documentation

## 2019-10-31 DIAGNOSIS — R1084 Generalized abdominal pain: Secondary | ICD-10-CM | POA: Diagnosis not present

## 2019-10-31 DIAGNOSIS — W19XXXA Unspecified fall, initial encounter: Secondary | ICD-10-CM

## 2019-10-31 DIAGNOSIS — Y999 Unspecified external cause status: Secondary | ICD-10-CM | POA: Diagnosis not present

## 2019-10-31 DIAGNOSIS — Y93E5 Activity, floor mopping and cleaning: Secondary | ICD-10-CM | POA: Insufficient documentation

## 2019-10-31 DIAGNOSIS — W01198A Fall on same level from slipping, tripping and stumbling with subsequent striking against other object, initial encounter: Secondary | ICD-10-CM | POA: Insufficient documentation

## 2019-10-31 DIAGNOSIS — Z5320 Procedure and treatment not carried out because of patient's decision for unspecified reasons: Secondary | ICD-10-CM | POA: Insufficient documentation

## 2019-10-31 DIAGNOSIS — R55 Syncope and collapse: Secondary | ICD-10-CM | POA: Diagnosis not present

## 2019-10-31 DIAGNOSIS — I10 Essential (primary) hypertension: Secondary | ICD-10-CM | POA: Diagnosis not present

## 2019-10-31 DIAGNOSIS — S01111A Laceration without foreign body of right eyelid and periocular area, initial encounter: Secondary | ICD-10-CM | POA: Diagnosis not present

## 2019-10-31 DIAGNOSIS — R58 Hemorrhage, not elsewhere classified: Secondary | ICD-10-CM | POA: Diagnosis not present

## 2019-10-31 DIAGNOSIS — R52 Pain, unspecified: Secondary | ICD-10-CM | POA: Diagnosis not present

## 2019-10-31 DIAGNOSIS — R519 Headache, unspecified: Secondary | ICD-10-CM | POA: Diagnosis not present

## 2019-10-31 MED ORDER — TETANUS-DIPHTH-ACELL PERTUSSIS 5-2.5-18.5 LF-MCG/0.5 IM SUSP
0.5000 mL | Freq: Once | INTRAMUSCULAR | Status: DC
  Filled 2019-10-31: qty 0.5
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
## 2019-10-31 NOTE — ED Triage Notes (Addendum)
Pt transported from home by [facility] after fall tonight while mopping, laceration to R eyebrow, pt does not remember specifics. Pt is unsteady ambulating at baseline per husband.  Bleeding controlled, Fentanyl 131mcg IN given. Pt does take Plavix

## 2019-11-01 ENCOUNTER — Emergency Department (HOSPITAL_COMMUNITY): Payer: Medicare Other

## 2019-11-01 DIAGNOSIS — R519 Headache, unspecified: Secondary | ICD-10-CM | POA: Diagnosis not present

## 2019-11-01 DIAGNOSIS — S01111A Laceration without foreign body of right eyelid and periocular area, initial encounter: Secondary | ICD-10-CM | POA: Diagnosis not present

## 2019-11-01 LAB — COMPREHENSIVE METABOLIC PANEL
ALT: 10 U/L (ref 0–44)
AST: 19 U/L (ref 15–41)
Albumin: 3.7 g/dL (ref 3.5–5.0)
Alkaline Phosphatase: 54 U/L (ref 38–126)
Anion gap: 9 (ref 5–15)
BUN: 13 mg/dL (ref 8–23)
CO2: 26 mmol/L (ref 22–32)
Calcium: 9.5 mg/dL (ref 8.9–10.3)
Chloride: 106 mmol/L (ref 98–111)
Creatinine, Ser: 0.68 mg/dL (ref 0.44–1.00)
GFR calc Af Amer: >60 mL/min (ref >60)
GFR calc non Af Amer: >60 mL/min (ref >60)
Glucose, Bld: 102 mg/dL — ABNORMAL HIGH (ref 70–99)
Potassium: 4.5 mmol/L (ref 3.5–5.1)
Sodium: 141 mmol/L (ref 135–145)
Total Bilirubin: 0.6 mg/dL (ref 0.3–1.2)
Total Protein: 5.8 g/dL — ABNORMAL LOW (ref 6.5–8.1)

## 2019-11-01 LAB — CBC WITH DIFFERENTIAL/PLATELET
Abs Immature Granulocytes: 0.02 10*3/uL (ref 0.00–0.07)
Basophils Absolute: 0 10*3/uL (ref 0.0–0.1)
Basophils Relative: 0 %
Eosinophils Absolute: 0.2 10*3/uL (ref 0.0–0.5)
Eosinophils Relative: 2 %
HCT: 42.1 % (ref 36.0–46.0)
Hemoglobin: 13.9 g/dL (ref 12.0–15.0)
Immature Granulocytes: 0 %
Lymphocytes Relative: 11 %
Lymphs Abs: 0.8 10*3/uL (ref 0.7–4.0)
MCH: 31.2 pg (ref 26.0–34.0)
MCHC: 33 g/dL (ref 30.0–36.0)
MCV: 94.6 fL (ref 80.0–100.0)
Monocytes Absolute: 0.4 10*3/uL (ref 0.1–1.0)
Monocytes Relative: 5 %
Neutro Abs: 6.1 10*3/uL (ref 1.7–7.7)
Neutrophils Relative %: 82 %
Platelets: 161 10*3/uL (ref 150–400)
RBC: 4.45 MIL/uL (ref 3.87–5.11)
RDW: 13.1 % (ref 11.5–15.5)
WBC: 7.4 10*3/uL (ref 4.0–10.5)
nRBC: 0 % (ref 0.0–0.2)

## 2019-11-01 LAB — URINALYSIS, ROUTINE W REFLEX MICROSCOPIC
Bacteria, UA: NONE SEEN
Bilirubin Urine: NEGATIVE
Glucose, UA: NEGATIVE mg/dL
Hgb urine dipstick: NEGATIVE
Ketones, ur: NEGATIVE mg/dL
Nitrite: NEGATIVE
Protein, ur: NEGATIVE mg/dL
Specific Gravity, Urine: 1.017 (ref 1.005–1.030)
pH: 6 (ref 5.0–8.0)

## 2019-11-01 MED ORDER — ACETAMINOPHEN 325 MG PO TABS
650.0000 mg | ORAL_TABLET | Freq: Once | ORAL | Status: AC
  Administered 2019-11-01: 650 mg via ORAL
  Filled 2019-11-01: qty 2

## 2019-11-01 NOTE — ED Notes (Signed)
Pt returned from CT via stretcher.

## 2019-11-01 NOTE — ED Notes (Signed)
Pt verbalized understanding of d/c instructions, follow up, medications, pain management, wound care and fall safety. Pt transported to [place] via [place] and assisted into vehicle

## 2019-11-01 NOTE — ED Notes (Signed)
Urine culture tube sent down with urine sample.   

## 2019-11-05 ENCOUNTER — Other Ambulatory Visit: Payer: Self-pay | Admitting: Radiation Therapy

## 2019-11-05 DIAGNOSIS — E538 Deficiency of other specified B group vitamins: Secondary | ICD-10-CM | POA: Diagnosis not present

## 2019-11-05 DIAGNOSIS — R519 Headache, unspecified: Secondary | ICD-10-CM | POA: Diagnosis not present

## 2019-11-05 DIAGNOSIS — R296 Repeated falls: Secondary | ICD-10-CM | POA: Diagnosis not present

## 2019-11-05 DIAGNOSIS — S01111D Laceration without foreign body of right eyelid and periocular area, subsequent encounter: Secondary | ICD-10-CM | POA: Diagnosis not present

## 2019-11-09 ENCOUNTER — Inpatient Hospital Stay: Payer: Medicare HMO | Attending: Internal Medicine | Admitting: Internal Medicine

## 2019-11-09 ENCOUNTER — Other Ambulatory Visit: Payer: Self-pay

## 2019-11-09 DIAGNOSIS — G629 Polyneuropathy, unspecified: Secondary | ICD-10-CM

## 2019-11-09 DIAGNOSIS — R269 Unspecified abnormalities of gait and mobility: Secondary | ICD-10-CM | POA: Insufficient documentation

## 2019-11-09 DIAGNOSIS — Z780 Asymptomatic menopausal state: Secondary | ICD-10-CM | POA: Insufficient documentation

## 2019-11-09 DIAGNOSIS — Z800 Family history of malignant neoplasm of digestive organs: Secondary | ICD-10-CM | POA: Diagnosis not present

## 2019-11-09 DIAGNOSIS — G4700 Insomnia, unspecified: Secondary | ICD-10-CM | POA: Diagnosis not present

## 2019-11-09 DIAGNOSIS — Z8049 Family history of malignant neoplasm of other genital organs: Secondary | ICD-10-CM | POA: Diagnosis not present

## 2019-11-09 DIAGNOSIS — Z808 Family history of malignant neoplasm of other organs or systems: Secondary | ICD-10-CM | POA: Insufficient documentation

## 2019-11-09 DIAGNOSIS — R5383 Other fatigue: Secondary | ICD-10-CM | POA: Diagnosis not present

## 2019-11-09 DIAGNOSIS — C3490 Malignant neoplasm of unspecified part of unspecified bronchus or lung: Secondary | ICD-10-CM | POA: Diagnosis not present

## 2019-11-09 DIAGNOSIS — Z8051 Family history of malignant neoplasm of kidney: Secondary | ICD-10-CM | POA: Insufficient documentation

## 2019-11-09 DIAGNOSIS — R296 Repeated falls: Secondary | ICD-10-CM | POA: Insufficient documentation

## 2019-11-09 DIAGNOSIS — I252 Old myocardial infarction: Secondary | ICD-10-CM | POA: Diagnosis not present

## 2019-11-09 DIAGNOSIS — F17210 Nicotine dependence, cigarettes, uncomplicated: Secondary | ICD-10-CM | POA: Diagnosis not present

## 2019-11-09 DIAGNOSIS — G4489 Other headache syndrome: Secondary | ICD-10-CM | POA: Diagnosis not present
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
## 2019-11-11 ENCOUNTER — Telehealth: Payer: Self-pay | Admitting: Cardiology

## 2019-11-11 NOTE — Telephone Encounter (Signed)
New Message:    Please call, pt wants to ask some questions. She said it was about her medicine and some other things.

## 2019-11-11 NOTE — Telephone Encounter (Signed)
Left message for patient to call back  

## 2019-11-11 NOTE — Telephone Encounter (Signed)
Spoke with patient. Patient wanted to know if she needs to hold any medications when she gets her knee injection. Patient instructed to have physician's office send a clearance form to Dr. Allison Quarry office to instruct on any special precautions. Patient verbalized understanding.

## 2019-11-12 ENCOUNTER — Other Ambulatory Visit: Payer: Self-pay | Admitting: *Deleted

## 2019-11-12 ENCOUNTER — Telehealth: Payer: Self-pay | Admitting: Pulmonary Disease

## 2019-11-12 ENCOUNTER — Other Ambulatory Visit: Payer: Self-pay | Admitting: Pulmonary Disease

## 2019-11-12 MED ORDER — LEVALBUTEROL TARTRATE 45 MCG/ACT IN AERO: INHALATION_SPRAY | RESPIRATORY_TRACT | 2 refills | Status: DC

## 2019-11-12 NOTE — Telephone Encounter (Signed)
Spoke with pt and advised rx for  Xopenex inhaler was sent to pharmacy. Nothing further is needed.

## 2019-11-13 DIAGNOSIS — S01111D Laceration without foreign body of right eyelid and periocular area, subsequent encounter: Secondary | ICD-10-CM | POA: Diagnosis not present

## 2019-11-13 DIAGNOSIS — I714 Abdominal aortic aneurysm, without rupture: Secondary | ICD-10-CM | POA: Diagnosis not present

## 2019-11-13 DIAGNOSIS — I951 Orthostatic hypotension: Secondary | ICD-10-CM | POA: Diagnosis not present

## 2019-11-13 DIAGNOSIS — I25118 Atherosclerotic heart disease of native coronary artery with other forms of angina pectoris: Secondary | ICD-10-CM | POA: Diagnosis not present

## 2019-11-13 DIAGNOSIS — F329 Major depressive disorder, single episode, unspecified: Secondary | ICD-10-CM | POA: Diagnosis not present

## 2019-11-13 DIAGNOSIS — F419 Anxiety disorder, unspecified: Secondary | ICD-10-CM | POA: Diagnosis not present

## 2019-11-13 DIAGNOSIS — N189 Chronic kidney disease, unspecified: Secondary | ICD-10-CM | POA: Diagnosis not present

## 2019-11-13 DIAGNOSIS — J439 Emphysema, unspecified: Secondary | ICD-10-CM | POA: Diagnosis not present

## 2019-11-13 DIAGNOSIS — C3491 Malignant neoplasm of unspecified part of right bronchus or lung: Secondary | ICD-10-CM | POA: Diagnosis not present

## 2019-11-16 DIAGNOSIS — F329 Major depressive disorder, single episode, unspecified: Secondary | ICD-10-CM | POA: Diagnosis not present

## 2019-11-16 DIAGNOSIS — C3491 Malignant neoplasm of unspecified part of right bronchus or lung: Secondary | ICD-10-CM | POA: Diagnosis not present

## 2019-11-16 DIAGNOSIS — S01111D Laceration without foreign body of right eyelid and periocular area, subsequent encounter: Secondary | ICD-10-CM | POA: Diagnosis not present

## 2019-11-16 DIAGNOSIS — I714 Abdominal aortic aneurysm, without rupture: Secondary | ICD-10-CM | POA: Diagnosis not present

## 2019-11-16 DIAGNOSIS — J439 Emphysema, unspecified: Secondary | ICD-10-CM | POA: Diagnosis not present

## 2019-11-16 DIAGNOSIS — I25118 Atherosclerotic heart disease of native coronary artery with other forms of angina pectoris: Secondary | ICD-10-CM | POA: Diagnosis not present

## 2019-11-16 DIAGNOSIS — F419 Anxiety disorder, unspecified: Secondary | ICD-10-CM | POA: Diagnosis not present

## 2019-11-16 DIAGNOSIS — N189 Chronic kidney disease, unspecified: Secondary | ICD-10-CM | POA: Diagnosis not present

## 2019-11-16 DIAGNOSIS — I951 Orthostatic hypotension: Secondary | ICD-10-CM | POA: Diagnosis not present

## 2019-11-17 DIAGNOSIS — I951 Orthostatic hypotension: Secondary | ICD-10-CM | POA: Diagnosis not present

## 2019-11-17 DIAGNOSIS — I25118 Atherosclerotic heart disease of native coronary artery with other forms of angina pectoris: Secondary | ICD-10-CM | POA: Diagnosis not present

## 2019-11-17 DIAGNOSIS — S01111D Laceration without foreign body of right eyelid and periocular area, subsequent encounter: Secondary | ICD-10-CM | POA: Diagnosis not present

## 2019-11-17 DIAGNOSIS — I714 Abdominal aortic aneurysm, without rupture: Secondary | ICD-10-CM | POA: Diagnosis not present

## 2019-11-17 DIAGNOSIS — C3491 Malignant neoplasm of unspecified part of right bronchus or lung: Secondary | ICD-10-CM | POA: Diagnosis not present

## 2019-11-17 DIAGNOSIS — F419 Anxiety disorder, unspecified: Secondary | ICD-10-CM | POA: Diagnosis not present

## 2019-11-17 DIAGNOSIS — F329 Major depressive disorder, single episode, unspecified: Secondary | ICD-10-CM | POA: Diagnosis not present

## 2019-11-17 DIAGNOSIS — J439 Emphysema, unspecified: Secondary | ICD-10-CM | POA: Diagnosis not present

## 2019-11-17 DIAGNOSIS — N189 Chronic kidney disease, unspecified: Secondary | ICD-10-CM | POA: Diagnosis not present

## 2019-11-18 ENCOUNTER — Telehealth: Payer: Self-pay | Admitting: Cardiology

## 2019-11-18 ENCOUNTER — Other Ambulatory Visit: Payer: Self-pay | Admitting: Cardiology

## 2019-11-18 MED ORDER — CLOPIDOGREL BISULFATE 75 MG PO TABS: 75.0000 mg | ORAL_TABLET | ORAL | 3 refills | Status: DC

## 2019-11-18 NOTE — Telephone Encounter (Signed)
New Message:     Please call, pt says she have some questions about her medicine.

## 2019-11-18 NOTE — Telephone Encounter (Signed)
*  STAT* If patient is at the pharmacy, call can be transferred to refill team.   1. Which medications need to be refilled? (please list name of each medication and dose if known)  Clopidogrel  2. Which pharmacy/location (including street and city if local pharmacy) is medication to be sent to? [facility] [address], [place]  3. Do they need a 30 day or 90 day supply 12

## 2019-11-18 NOTE — Telephone Encounter (Signed)
Called patient- advised that she was feeling uneasy on her feet- not dizzy, but wanted to know about what medications she was on.   She also states that she is to have a procedure on her knee and would need clearance, advised with her that we needed to have that office send [place] that information. Patient verbalized understanding.

## 2019-11-22 DIAGNOSIS — F329 Major depressive disorder, single episode, unspecified: Secondary | ICD-10-CM | POA: Diagnosis not present

## 2019-11-22 DIAGNOSIS — I25118 Atherosclerotic heart disease of native coronary artery with other forms of angina pectoris: Secondary | ICD-10-CM | POA: Diagnosis not present

## 2019-11-22 DIAGNOSIS — S01111D Laceration without foreign body of right eyelid and periocular area, subsequent encounter: Secondary | ICD-10-CM | POA: Diagnosis not present

## 2019-11-22 DIAGNOSIS — N189 Chronic kidney disease, unspecified: Secondary | ICD-10-CM | POA: Diagnosis not present

## 2019-11-22 DIAGNOSIS — I951 Orthostatic hypotension: Secondary | ICD-10-CM | POA: Diagnosis not present

## 2019-11-22 DIAGNOSIS — J439 Emphysema, unspecified: Secondary | ICD-10-CM | POA: Diagnosis not present

## 2019-11-22 DIAGNOSIS — I714 Abdominal aortic aneurysm, without rupture: Secondary | ICD-10-CM | POA: Diagnosis not present

## 2019-11-22 DIAGNOSIS — C3491 Malignant neoplasm of unspecified part of right bronchus or lung: Secondary | ICD-10-CM | POA: Diagnosis not present

## 2019-11-22 DIAGNOSIS — F419 Anxiety disorder, unspecified: Secondary | ICD-10-CM | POA: Diagnosis not present

## 2019-11-27 IMAGING — MG DIGITAL SCREENING BILATERAL MAMMOGRAM WITH TOMO AND CAD
8 series · 9 of 24 positions shown · non-contrast
Comparison: Previous exam(s).

CLINICAL DATA: Screening.

EXAM:
DIGITAL SCREENING BILATERAL MAMMOGRAM WITH TOMO AND CAD

[L MLO synth-2D]
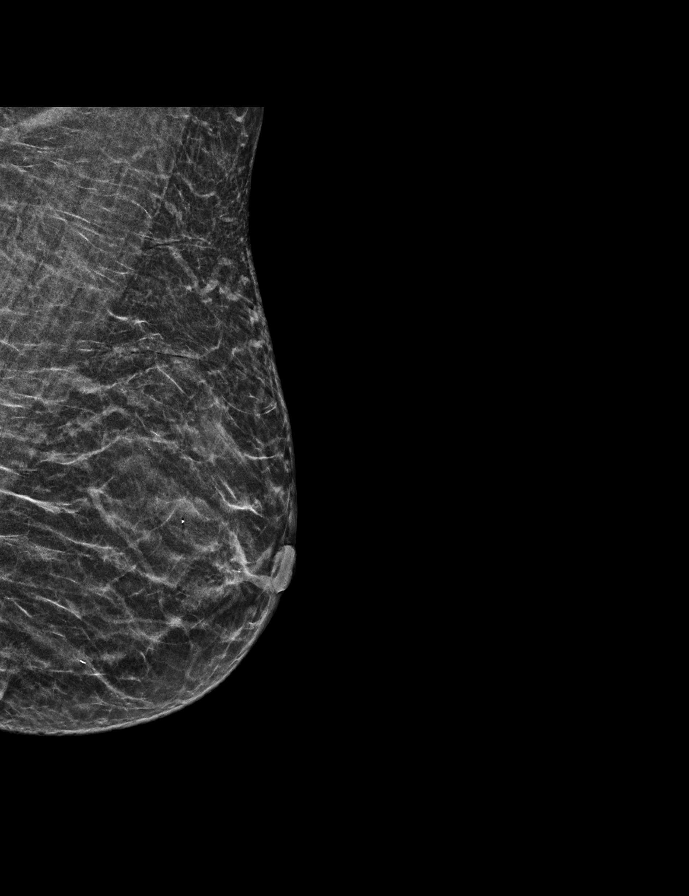

[R CC synth-2D]
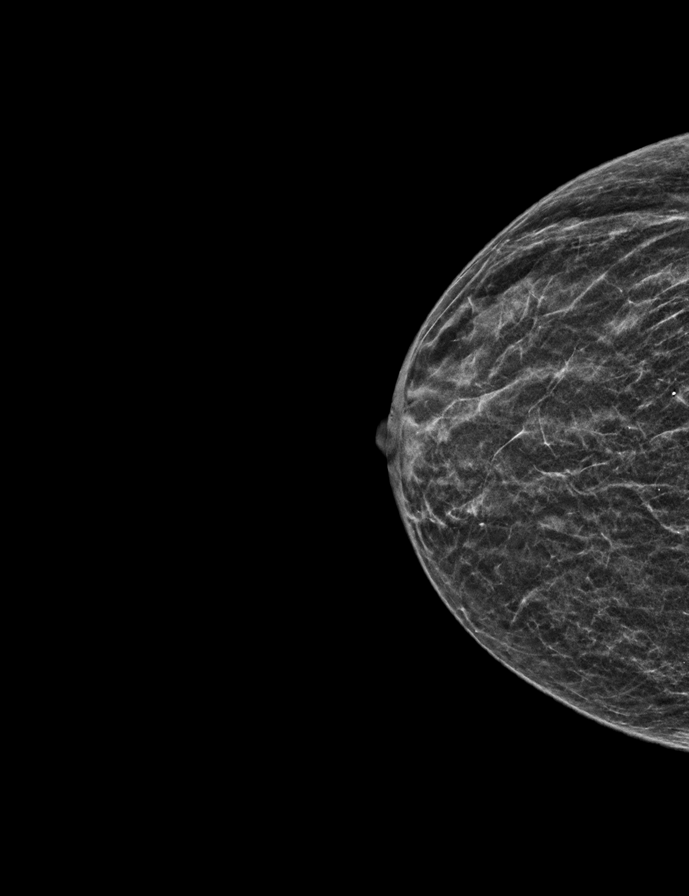

[R MLO synth-2D]
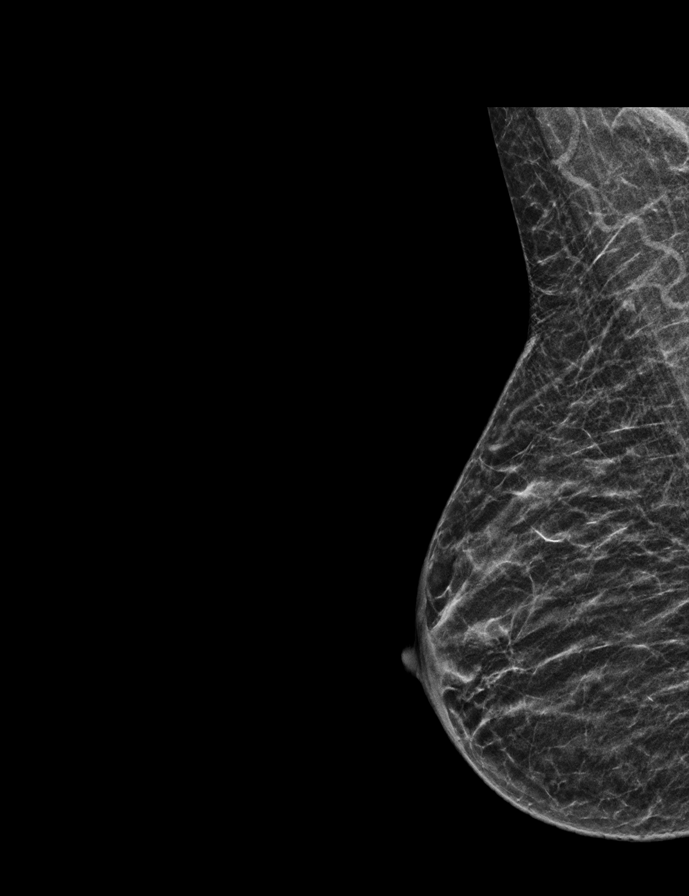

[L CC synth-2D]
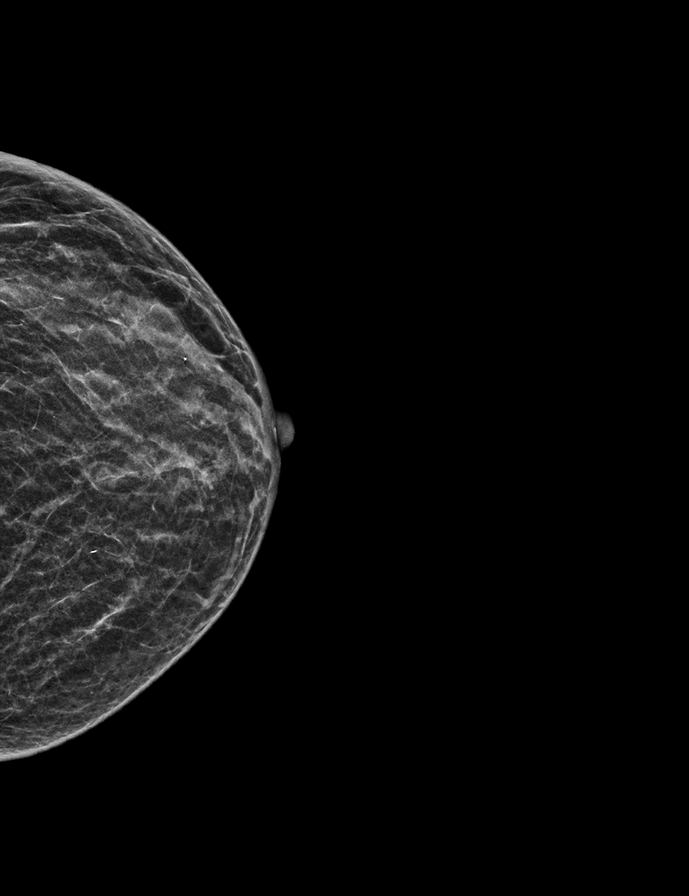

[R CC tomo · 2 of 29 frames shown]
[frame 10/29]
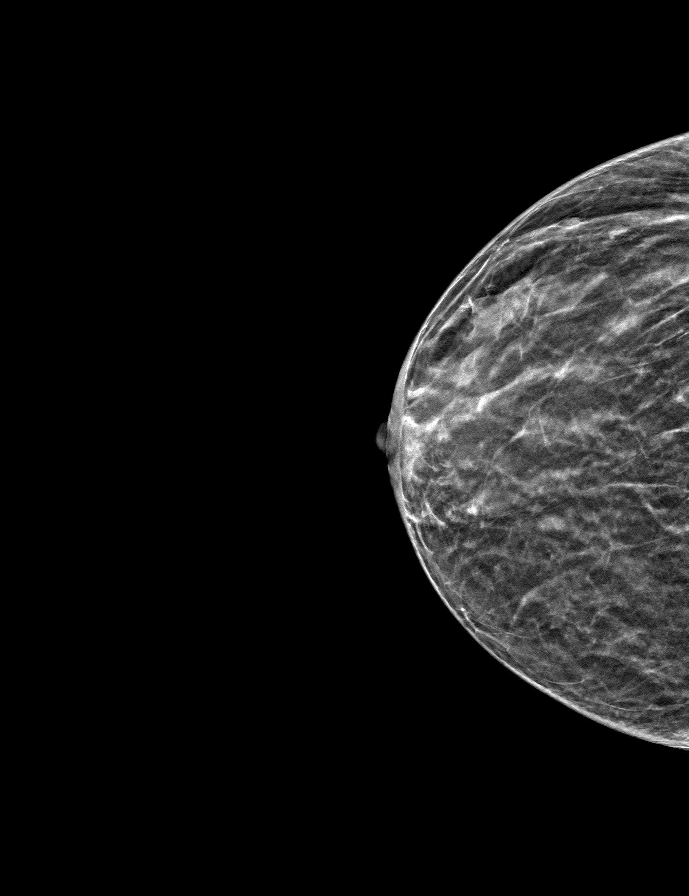
[frame 15/29]
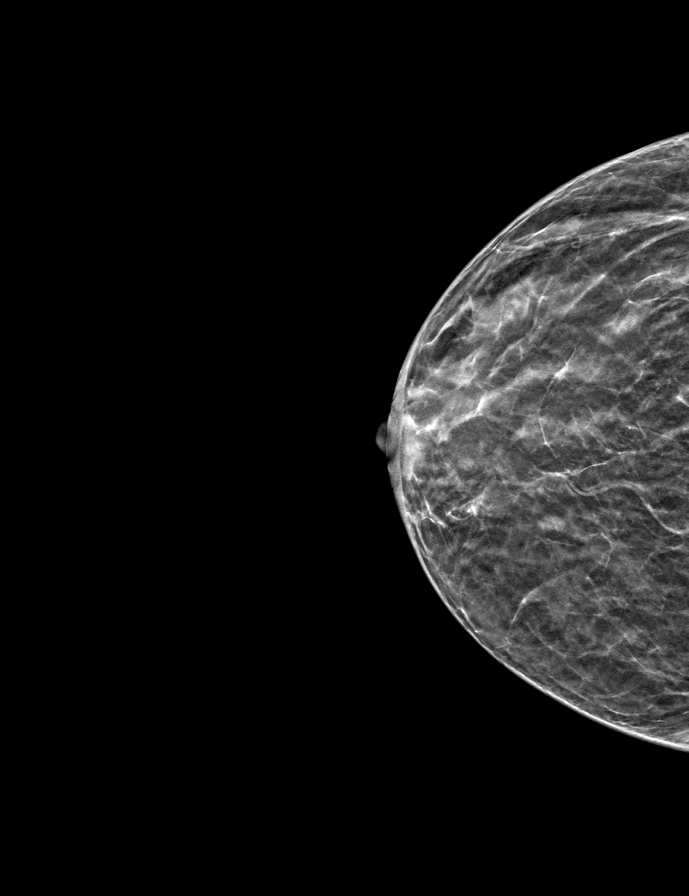

[L MLO tomo · tomo slice 17/33.0]
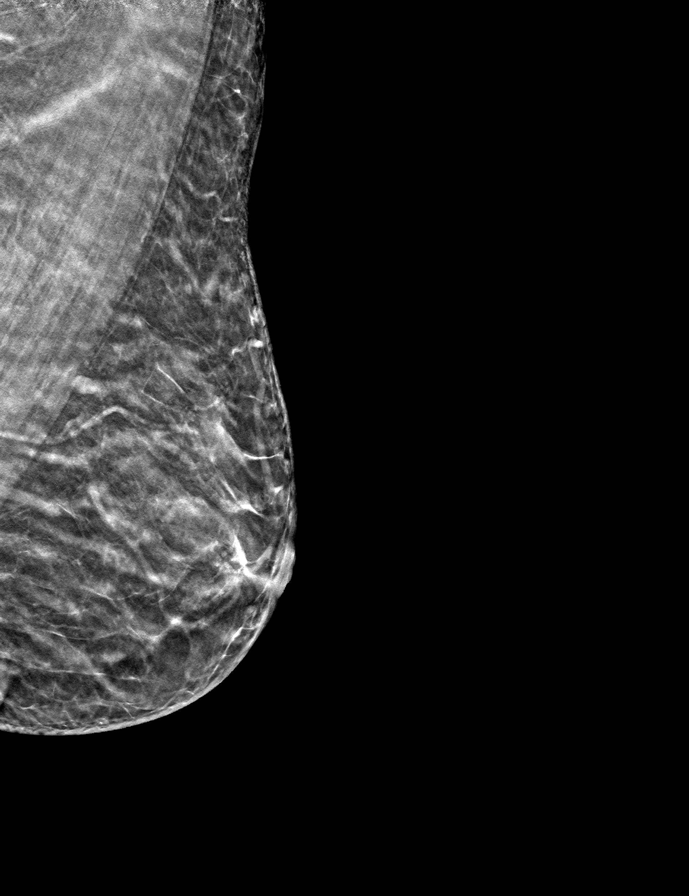

[R MLO tomo · tomo slice 17/32.0]
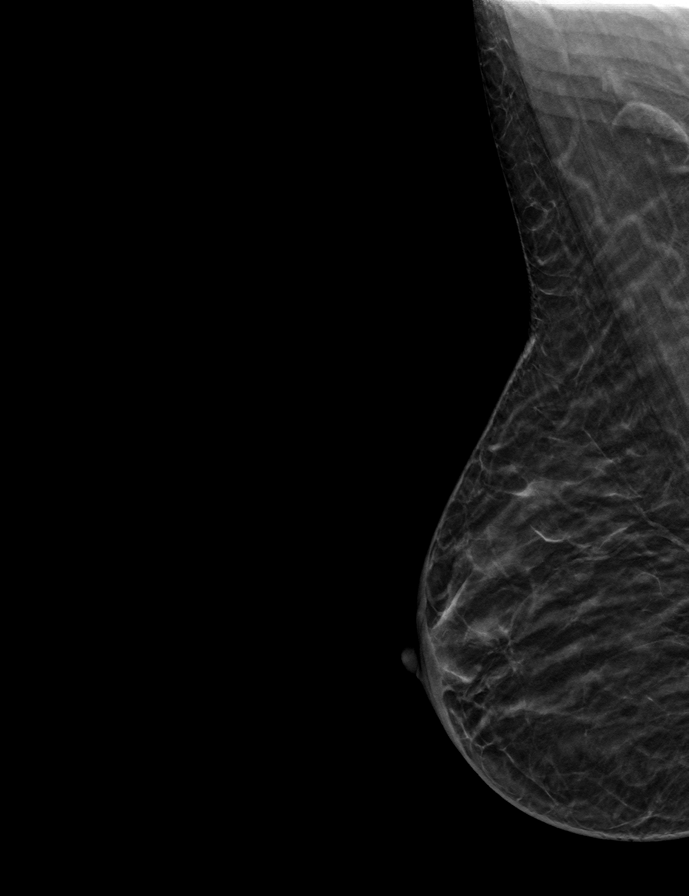

[L CC tomo · tomo slice 15/29.0]
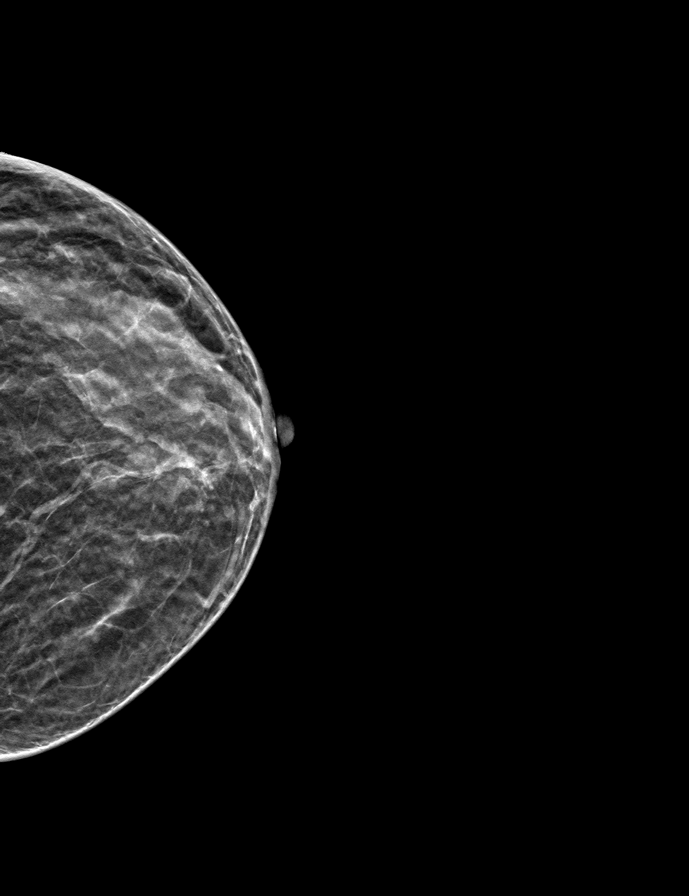

[9 of 24 positions shown; findings below may reference images not displayed]

ACR Breast Density Category c: The breast tissue is heterogeneously
dense, which may obscure small masses.
FINDINGS: There are no findings suspicious for malignancy. Images were
processed with CAD.
IMPRESSION: No mammographic evidence of malignancy. A result letter of this
screening mammogram will be mailed directly to the patient.

RECOMMENDATION:
Screening mammogram in one year. (Code:[id number]-U-LHB)

BI-RADS CATEGORY  1: Negative.

## 2019-11-29 ENCOUNTER — Other Ambulatory Visit: Payer: Self-pay

## 2019-11-29 ENCOUNTER — Ambulatory Visit (INDEPENDENT_AMBULATORY_CARE_PROVIDER_SITE_OTHER): Payer: Medicare HMO

## 2019-11-29 ENCOUNTER — Encounter: Payer: Self-pay | Admitting: Primary Care

## 2019-11-29 ENCOUNTER — Ambulatory Visit (INDEPENDENT_AMBULATORY_CARE_PROVIDER_SITE_OTHER): Payer: Medicare HMO | Admitting: Primary Care

## 2019-11-29 VITALS — BP 118/70 | HR 75 | Temp 97.7°F | Ht 65.0 in | Wt 130.8 lb

## 2019-11-29 DIAGNOSIS — R0781 Pleurodynia: Secondary | ICD-10-CM

## 2019-11-29 DIAGNOSIS — R32 Unspecified urinary incontinence: Secondary | ICD-10-CM | POA: Diagnosis not present

## 2019-11-29 DIAGNOSIS — J479 Bronchiectasis, uncomplicated: Secondary | ICD-10-CM | POA: Diagnosis not present

## 2019-11-29 DIAGNOSIS — N189 Chronic kidney disease, unspecified: Secondary | ICD-10-CM | POA: Diagnosis not present

## 2019-11-29 DIAGNOSIS — J439 Emphysema, unspecified: Secondary | ICD-10-CM | POA: Diagnosis not present

## 2019-11-29 DIAGNOSIS — J449 Chronic obstructive pulmonary disease, unspecified: Secondary | ICD-10-CM

## 2019-11-29 DIAGNOSIS — I25118 Atherosclerotic heart disease of native coronary artery with other forms of angina pectoris: Secondary | ICD-10-CM | POA: Diagnosis not present

## 2019-11-29 DIAGNOSIS — S01111D Laceration without foreign body of right eyelid and periocular area, subsequent encounter: Secondary | ICD-10-CM | POA: Diagnosis not present

## 2019-11-29 DIAGNOSIS — F329 Major depressive disorder, single episode, unspecified: Secondary | ICD-10-CM | POA: Diagnosis not present

## 2019-11-29 DIAGNOSIS — I252 Old myocardial infarction: Secondary | ICD-10-CM | POA: Diagnosis not present

## 2019-11-29 DIAGNOSIS — I714 Abdominal aortic aneurysm, without rupture: Secondary | ICD-10-CM | POA: Diagnosis not present

## 2019-11-29 DIAGNOSIS — C3491 Malignant neoplasm of unspecified part of right bronchus or lung: Secondary | ICD-10-CM | POA: Diagnosis not present

## 2019-11-29 LAB — D-DIMER, QUANTITATIVE: D-Dimer, Quant: 0.86 mcg/mL FEU — ABNORMAL HIGH (ref <0.50)

## 2019-11-29 NOTE — Progress Notes (Signed)
Reviewed and agree with assessment/plan.   [name], MD [facility] Pulmonary/Critical Care 11/29/2019, 1:28 PM Pager:  [phone]

## 2019-11-29 NOTE — Assessment & Plan Note (Signed)
-   Left side back pain x 3 weeks - Checking CXR and D-DIMER

## 2019-11-29 NOTE — Patient Instructions (Addendum)
Right ear impaction (ear wax):  Recommend you get over the counter Debrox drops - use this for 3 days to right ear and call PCP to make an apt to have right ear cleaned out  COPD: Continue Arnuity and Spiriva ONCE daily Bronchiectasis: Take mucinex 600mg  1-2 tabs twice a day for one week; then take as needed Use flutter valve twice a day   Orders: CXR today  Flutter valve   Referral: Please make an apt with pharmacy for smoking cessation  Follow-up: Due for regular follow up in November 2021 (recall may already be in with Dr. Halford Chessman)

## 2019-11-29 NOTE — Assessment & Plan Note (Addendum)
-   No signs of acute exacerbation on exam - CXR 11/29/2019 showed changes of COPD with chronic interstitial prominence. No new consolidation or edema - Continue Arnuity and Spiriva once daily  - Intolerant to LABA and albuterol d/t palpitations - No indication for abx or prednisone today - Strongly encourage smoking cessation, she would like to quit and is agreeing to see pharmacist for addition counseling

## 2019-11-29 NOTE — Telephone Encounter (Signed)
Called and spoke with patient letting her know results of CXR and D-dimer. Order placed for CTA. Patient expressed understanding. Nothing further needed at this time.

## 2019-11-29 NOTE — Progress Notes (Signed)
CXR did not show evidence of pneumonia. D-dimer was mildly elevated. Increased levels are associated with PE, inflammation or other causes. Recommend CTA to r/o PE d/t pleuritic pain.

## 2019-11-29 NOTE — Assessment & Plan Note (Signed)
-   Recommend patient take Mucinex 600mg  1-2 tablets twice daily  - Needs new flutter valve, encouraged patient use twice daily
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
## 2019-11-30 ENCOUNTER — Encounter (HOSPITAL_COMMUNITY): Payer: Self-pay

## 2019-11-30 ENCOUNTER — Telehealth: Payer: Self-pay | Admitting: Primary Care

## 2019-11-30 ENCOUNTER — Ambulatory Visit (HOSPITAL_COMMUNITY)
Admission: RE | Admit: 2019-11-30 | Discharge: 2019-11-30 | Disposition: A | Discharge status: Home / Self Care | Payer: Medicare HMO | Source: Ambulatory Visit | Attending: Primary Care | Admitting: Primary Care

## 2019-11-30 ENCOUNTER — Ambulatory Visit
Admission: RE | Admit: 2019-11-30 | Discharge: 2019-11-30 | Disposition: A | Discharge status: Home / Self Care | Payer: Medicare HMO | Source: Ambulatory Visit | Attending: Family Medicine | Admitting: Family Medicine

## 2019-11-30 DIAGNOSIS — I700 Atherosclerosis of aorta: Secondary | ICD-10-CM | POA: Diagnosis not present

## 2019-11-30 DIAGNOSIS — J449 Chronic obstructive pulmonary disease, unspecified: Secondary | ICD-10-CM

## 2019-11-30 DIAGNOSIS — R0781 Pleurodynia: Secondary | ICD-10-CM | POA: Insufficient documentation

## 2019-11-30 DIAGNOSIS — Z1231 Encounter for screening mammogram for malignant neoplasm of breast: Secondary | ICD-10-CM | POA: Diagnosis not present

## 2019-11-30 DIAGNOSIS — J439 Emphysema, unspecified: Secondary | ICD-10-CM | POA: Diagnosis not present

## 2019-11-30 DIAGNOSIS — K449 Diaphragmatic hernia without obstruction or gangrene: Secondary | ICD-10-CM | POA: Diagnosis not present

## 2019-11-30 DIAGNOSIS — M7989 Other specified soft tissue disorders: Secondary | ICD-10-CM | POA: Diagnosis not present

## 2019-11-30 MED ORDER — SODIUM CHLORIDE (PF) 0.9 % IJ SOLN
INTRAMUSCULAR | Status: AC
  Filled 2019-11-30: qty 50

## 2019-11-30 MED ORDER — IOHEXOL 350 MG/ML SOLN
100.0000 mL | Freq: Once | INTRAVENOUS | Status: AC | PRN
  Administered 2019-11-30: 100 mL via INTRAVENOUS

## 2019-11-30 NOTE — Telephone Encounter (Signed)
Per pt's AVS instructions, Mucinex 600mg  1-2 tablets BID.  Spoke with pt. She is aware of this information. Nothing further was needed.

## 2019-11-30 NOTE — Telephone Encounter (Signed)
Done, sorry about that

## 2019-11-30 NOTE — Telephone Encounter (Signed)
Beth - the CTa order needs to be signed by you before they can do the scan. Thanks.

## 2019-11-30 NOTE — Progress Notes (Signed)
Please let patient know CTA did not show a blood clot in her lungs. She has extensive severe emphysemas in upper lobes. No pneumonia or pleural effusion.   Plan maximize COPD treatment; she should continue Arnuity and Spiriva. May need prednisone taper if still having trouble with shortness of breath. Let me know if she would like this sent in please

## 2019-12-01 DIAGNOSIS — S01111D Laceration without foreign body of right eyelid and periocular area, subsequent encounter: Secondary | ICD-10-CM | POA: Diagnosis not present

## 2019-12-01 DIAGNOSIS — F329 Major depressive disorder, single episode, unspecified: Secondary | ICD-10-CM | POA: Diagnosis not present

## 2019-12-01 DIAGNOSIS — J439 Emphysema, unspecified: Secondary | ICD-10-CM | POA: Diagnosis not present

## 2019-12-01 DIAGNOSIS — C3491 Malignant neoplasm of unspecified part of right bronchus or lung: Secondary | ICD-10-CM | POA: Diagnosis not present

## 2019-12-01 DIAGNOSIS — F419 Anxiety disorder, unspecified: Secondary | ICD-10-CM | POA: Diagnosis not present

## 2019-12-01 DIAGNOSIS — I25118 Atherosclerotic heart disease of native coronary artery with other forms of angina pectoris: Secondary | ICD-10-CM | POA: Diagnosis not present

## 2019-12-01 DIAGNOSIS — I951 Orthostatic hypotension: Secondary | ICD-10-CM | POA: Diagnosis not present

## 2019-12-01 DIAGNOSIS — N189 Chronic kidney disease, unspecified: Secondary | ICD-10-CM | POA: Diagnosis not present

## 2019-12-01 DIAGNOSIS — I714 Abdominal aortic aneurysm, without rupture: Secondary | ICD-10-CM | POA: Diagnosis not present

## 2019-12-01 NOTE — Telephone Encounter (Signed)
Error

## 2019-12-02 DIAGNOSIS — S01111D Laceration without foreign body of right eyelid and periocular area, subsequent encounter: Secondary | ICD-10-CM | POA: Diagnosis not present

## 2019-12-02 DIAGNOSIS — N189 Chronic kidney disease, unspecified: Secondary | ICD-10-CM | POA: Diagnosis not present

## 2019-12-02 DIAGNOSIS — I25118 Atherosclerotic heart disease of native coronary artery with other forms of angina pectoris: Secondary | ICD-10-CM | POA: Diagnosis not present

## 2019-12-02 DIAGNOSIS — I714 Abdominal aortic aneurysm, without rupture: Secondary | ICD-10-CM | POA: Diagnosis not present

## 2019-12-02 DIAGNOSIS — C3491 Malignant neoplasm of unspecified part of right bronchus or lung: Secondary | ICD-10-CM | POA: Diagnosis not present

## 2019-12-02 DIAGNOSIS — I951 Orthostatic hypotension: Secondary | ICD-10-CM | POA: Diagnosis not present

## 2019-12-02 DIAGNOSIS — F329 Major depressive disorder, single episode, unspecified: Secondary | ICD-10-CM | POA: Diagnosis not present

## 2019-12-02 DIAGNOSIS — F419 Anxiety disorder, unspecified: Secondary | ICD-10-CM | POA: Diagnosis not present

## 2019-12-02 DIAGNOSIS — J439 Emphysema, unspecified: Secondary | ICD-10-CM | POA: Diagnosis not present

## 2019-12-07 ENCOUNTER — Ambulatory Visit (INDEPENDENT_AMBULATORY_CARE_PROVIDER_SITE_OTHER): Payer: Medicare HMO | Admitting: Pharmacist

## 2019-12-07 ENCOUNTER — Other Ambulatory Visit: Payer: Self-pay

## 2019-12-07 DIAGNOSIS — F17210 Nicotine dependence, cigarettes, uncomplicated: Secondary | ICD-10-CM

## 2019-12-07 DIAGNOSIS — Z7189 Other specified counseling: Secondary | ICD-10-CM

## 2019-12-07 DIAGNOSIS — Z720 Tobacco use: Secondary | ICD-10-CM

## 2019-12-07 MED ORDER — NICOTINE 10 MG IN INHA: 1.0000 | RESPIRATORY_TRACT | 0 refills | Status: DC | PRN

## 2019-12-07 NOTE — Progress Notes (Signed)
Subjective Patient presents to [facility] and seen by the pharmacist for smoking cessation counseling.  Patient states that she had quit smoking for 2 years but then resumed due to stress.  She had lung cancer her mother and brother passed away, and she has been nervous about Covid 19 pandemic.       Social History   Tobacco Use  Smoking Status Current Every Day Smoker  . Packs/day: 2.00  . Years: 40.00  . Pack years: 80.00  . Types: Cigarettes  Smokeless Tobacco Never Used  Tobacco Comment   1 pack a day 11/29/19 ARJ      Tobacco Use History  Age when started using tobacco on a daily basis 64 years old.  Type: cigarettes.  Number of cigarettes per day 1 to 1/2 pack, brand [employer] special select shorts.  Smokes first cigarette 0 minutes after waking.  Does not wake at night to smoke  Triggers include stress.  Quit Attempt History   Most recent quit attempt  3 years ago  Longest time ever been tobacco free 2 years by using nicotrol inhalers.  Methods tried in the past include nicotine patch, gum, inhaler.  She had irriation with patch and unable to afford the gum.  She was successful by using inhaler.  Rates IMPORTANCE of quitting tobacco on 1-10 scale of 10.  Rates READINESS of quitting tobacco on 1-10 scale of 10.  Rates CONFIDENCE of quitting tobacco on 1-10 scale of 7.  Motivators to quitting include history of cancer and health issue; barriers include under a lot of stress now    Immunization History  Administered Date(s) Administered  . Influenza Split 03/03/2012, 02/19/2017  . Influenza Whole 03/21/2006, 03/06/2007, 04/01/2010  . Influenza,inj,Quad PF,6+ Mos 03/03/2014, 03/04/2016, 02/08/2017, 03/10/2018  . Influenza-Unspecified 02/07/2017  . PFIZER SARS-COV-2 Vaccination 08/21/2019, 09/18/2019  . Td 06/03/2008     Assessment and Plan  1. Smoking Cessation   Patient states they are ready to quit smoking.    Discussed smoking cessation  agent Chant, Wellbutrin, and nicotine replacement therapies.  Patient is unable to take Wellbutrin/Chantix due to history of seizures.  She has tried getting patch but caused irritation.  Patient has also tried nicotine gum which she did not like and states she is unable to afford.  She is very adamant about resuming Nicotrol Inhaler as it has helped her be successful in the past.  Prescription for Nicotrol inhaler sent to [place].  She is willing to discuss using the nicotine patch if not approved.  Reviewed STAR quit plan.  After detailed discussion patient unable to decide on quit date.  Patient appeared overwhelmed and had difficulty staying on topic about other health conditions and medications.  Advised patient to review information and I will call to follow-up in 2 weeks to discuss how she would like to proceed.   Provided information on 1 800-QUIT NOW support program to inquire about assistance for nicotine replacement therapy.  2. Medication Reconciliation A drug regimen assessment was performed, including review of allergies, interactions, disease-state management, dosing and immunization history. Medications were reviewed with the patient, including name, instructions, indication, goals of therapy, potential side effects, importance of adherence, and safe use.  All questions encouraged and answered.  Instructed patient to follow-up with any further questions or concerns.  This appointment required 65 minutes of patient care (this includes precharting, chart review, review of results, face-to-face care, etc.).   Mariella Saa, PharmD, [employer], CPP Clinical Specialty Pharmacist (Rheumatology and  Pulmonology)  12/07/2019 3:44 PM

## 2019-12-08 DIAGNOSIS — R109 Unspecified abdominal pain: Secondary | ICD-10-CM | POA: Diagnosis not present

## 2019-12-08 DIAGNOSIS — H6123 Impacted cerumen, bilateral: Secondary | ICD-10-CM | POA: Diagnosis not present

## 2019-12-09 ENCOUNTER — Telehealth: Payer: Self-pay

## 2019-12-09 DIAGNOSIS — N189 Chronic kidney disease, unspecified: Secondary | ICD-10-CM | POA: Diagnosis not present

## 2019-12-09 DIAGNOSIS — C3491 Malignant neoplasm of unspecified part of right bronchus or lung: Secondary | ICD-10-CM | POA: Diagnosis not present

## 2019-12-09 DIAGNOSIS — S01111D Laceration without foreign body of right eyelid and periocular area, subsequent encounter: Secondary | ICD-10-CM | POA: Diagnosis not present

## 2019-12-09 DIAGNOSIS — I951 Orthostatic hypotension: Secondary | ICD-10-CM | POA: Diagnosis not present

## 2019-12-09 DIAGNOSIS — F419 Anxiety disorder, unspecified: Secondary | ICD-10-CM | POA: Diagnosis not present

## 2019-12-09 DIAGNOSIS — F329 Major depressive disorder, single episode, unspecified: Secondary | ICD-10-CM | POA: Diagnosis not present

## 2019-12-09 DIAGNOSIS — I714 Abdominal aortic aneurysm, without rupture: Secondary | ICD-10-CM | POA: Diagnosis not present

## 2019-12-09 DIAGNOSIS — I25118 Atherosclerotic heart disease of native coronary artery with other forms of angina pectoris: Secondary | ICD-10-CM | POA: Diagnosis not present

## 2019-12-09 DIAGNOSIS — J439 Emphysema, unspecified: Secondary | ICD-10-CM | POA: Diagnosis not present

## 2019-12-09 NOTE — Telephone Encounter (Signed)
-----   Message from Pincus Large sent at 12/08/2019  4:26 PM EDT ----- Regarding: RE: Letter in Mail Looks like this may be about imaging studies ordered by an Geraldo Pitter that were performed on 6/29. She will need to reach out to her pulmonologist.  Michele [name]   ----- Message ----- From: Ranae Plumber Sent: 12/08/2019   3:18 PM EDT To: Pincus Large, Zola Button, RN Subject: RE: Letter in [place],     I'm sorry, I'm not sure what that is regarding.  I haven't gotten anything authorized for her recently. She can call her insurance company and they can give her more info on what it is. Thanks,     Ailene Ravel ----- Message ----- From: Zola Button, RN Sent: 12/08/2019   2:53 PM EDT To: Carilyn Goodpasture Hobgood, Pincus Large Subject: Letter in Mail                                 Afternoon,   Sam got a VM about this patient stating she got a letter in the mail that "approval was good until 6/29". I'm not sure if this is in regard to a scan Dr. Mickeal Skinner ordered or something else, but I just wanted to reach out and see if either of you could shed some light on the matter or if I need to ask someone else.   Thanks! Rashanda Magloire

## 2019-12-09 NOTE — Telephone Encounter (Signed)
Returned patient's call and left a VM stating I was not sure what the letter she received in the mail pertained to, but she may want to reach out to her pulmonologist or insurance company to see if either could provide more insight. Provided direct call back number should patient have any other questions/concerns.

## 2019-12-10 ENCOUNTER — Telehealth: Payer: Self-pay | Admitting: Cardiology

## 2019-12-10 ENCOUNTER — Telehealth: Payer: Self-pay | Admitting: Pulmonary Disease

## 2019-12-10 NOTE — Telephone Encounter (Signed)
Lm to call back and to let pt know to take Imodium for diarrhea would be okay ./cy

## 2019-12-10 NOTE — Telephone Encounter (Signed)
   Pt c/o medication issue:  1. Name of Medication: pepto bismol  2. How are you currently taking this medication (dosage and times per day)?   3. Are you having a reaction (difficulty breathing--STAT)?  4. What is your medication issue? Pt is having diarrhemia, she is taking pepto bismol but she saw on the bottle not to take when taking blood thinners. She would like to know if its ok that she took it

## 2019-12-10 NOTE — Telephone Encounter (Signed)
Recivied form from CoverMyMeds  that states Chantix Starting [employer] is covered and dose not require PA. Dr. Halford Chessman please advise.

## 2019-12-10 NOTE — Telephone Encounter (Signed)
Michele [name] was advised to disregard PA for Chantix.

## 2019-12-10 NOTE — Telephone Encounter (Signed)
She has history of seizures.  Therefore, she can not use chantix.    She was seen by [facility] on 12/07/19 and script for nicotrol inhaler sent for smoking cessation.

## 2019-12-13 ENCOUNTER — Telehealth: Payer: Self-pay | Admitting: Pulmonary Disease

## 2019-12-13 NOTE — Telephone Encounter (Signed)
Called and spoke with [facility] about the request. Stated to them that we are trying to get pt switched to a different med and they verbalized understanding. Nothing further needed.

## 2019-12-13 NOTE — Telephone Encounter (Signed)
Called pt's pharmacy to see if pt was able to get med or if a PA was still required. Spoke with Denitra who stated that she can see that a PA was done for the Nicotrol.  She stated prior to PA being initiated, med was $922 but after PA, it just brought the price down to $499.  Denitra stated that Chantix and buprion are covered and might be cheaper for pt. Stated to her that due to pt's history of seizures she cannot do Chantix.  Dr. Halford Chessman, please advise on recommendations.

## 2019-12-13 NOTE — Telephone Encounter (Signed)
Left message for patient okay to take imodium.

## 2019-12-14 NOTE — Telephone Encounter (Signed)
Thank you for completing the PA. At appointment discussed with patient that nicotrol would likely not be covered/too expensive and she would be willing to further discuss nicotine patches.  She has a follow up call with pharmacy next week (not on schedule).  Will address during the follow up call.  Nothing further needed. Museum/gallery conservator

## 2019-12-16 DIAGNOSIS — N393 Stress incontinence (female) (male): Secondary | ICD-10-CM | POA: Diagnosis not present

## 2019-12-16 DIAGNOSIS — R350 Frequency of micturition: Secondary | ICD-10-CM | POA: Diagnosis not present

## 2019-12-17 DIAGNOSIS — F419 Anxiety disorder, unspecified: Secondary | ICD-10-CM | POA: Diagnosis not present

## 2019-12-17 DIAGNOSIS — I25118 Atherosclerotic heart disease of native coronary artery with other forms of angina pectoris: Secondary | ICD-10-CM | POA: Diagnosis not present

## 2019-12-17 DIAGNOSIS — S01111D Laceration without foreign body of right eyelid and periocular area, subsequent encounter: Secondary | ICD-10-CM | POA: Diagnosis not present

## 2019-12-17 DIAGNOSIS — J439 Emphysema, unspecified: Secondary | ICD-10-CM | POA: Diagnosis not present

## 2019-12-17 DIAGNOSIS — C3491 Malignant neoplasm of unspecified part of right bronchus or lung: Secondary | ICD-10-CM | POA: Diagnosis not present

## 2019-12-17 DIAGNOSIS — I951 Orthostatic hypotension: Secondary | ICD-10-CM | POA: Diagnosis not present

## 2019-12-17 DIAGNOSIS — I714 Abdominal aortic aneurysm, without rupture: Secondary | ICD-10-CM | POA: Diagnosis not present

## 2019-12-17 DIAGNOSIS — N189 Chronic kidney disease, unspecified: Secondary | ICD-10-CM | POA: Diagnosis not present

## 2019-12-17 DIAGNOSIS — F329 Major depressive disorder, single episode, unspecified: Secondary | ICD-10-CM | POA: Diagnosis not present

## 2019-12-21 ENCOUNTER — Other Ambulatory Visit: Payer: Self-pay | Admitting: Nurse Practitioner

## 2019-12-21 DIAGNOSIS — R109 Unspecified abdominal pain: Secondary | ICD-10-CM | POA: Diagnosis not present

## 2019-12-21 DIAGNOSIS — Z01419 Encounter for gynecological examination (general) (routine) without abnormal findings: Secondary | ICD-10-CM | POA: Diagnosis not present

## 2019-12-23 ENCOUNTER — Other Ambulatory Visit: Payer: Self-pay | Admitting: Nurse Practitioner

## 2019-12-23 DIAGNOSIS — F329 Major depressive disorder, single episode, unspecified: Secondary | ICD-10-CM | POA: Diagnosis not present

## 2019-12-23 DIAGNOSIS — I25118 Atherosclerotic heart disease of native coronary artery with other forms of angina pectoris: Secondary | ICD-10-CM | POA: Diagnosis not present

## 2019-12-23 DIAGNOSIS — I951 Orthostatic hypotension: Secondary | ICD-10-CM | POA: Diagnosis not present

## 2019-12-23 DIAGNOSIS — F419 Anxiety disorder, unspecified: Secondary | ICD-10-CM | POA: Diagnosis not present

## 2019-12-23 DIAGNOSIS — I714 Abdominal aortic aneurysm, without rupture: Secondary | ICD-10-CM | POA: Diagnosis not present

## 2019-12-23 DIAGNOSIS — J439 Emphysema, unspecified: Secondary | ICD-10-CM | POA: Diagnosis not present

## 2019-12-23 DIAGNOSIS — N189 Chronic kidney disease, unspecified: Secondary | ICD-10-CM | POA: Diagnosis not present

## 2019-12-23 DIAGNOSIS — R109 Unspecified abdominal pain: Secondary | ICD-10-CM

## 2019-12-23 DIAGNOSIS — S01111D Laceration without foreign body of right eyelid and periocular area, subsequent encounter: Secondary | ICD-10-CM | POA: Diagnosis not present

## 2019-12-23 DIAGNOSIS — C3491 Malignant neoplasm of unspecified part of right bronchus or lung: Secondary | ICD-10-CM | POA: Diagnosis not present

## 2019-12-24 DIAGNOSIS — H6123 Impacted cerumen, bilateral: Secondary | ICD-10-CM | POA: Diagnosis not present

## 2019-12-24 DIAGNOSIS — H903 Sensorineural hearing loss, bilateral: Secondary | ICD-10-CM | POA: Diagnosis not present

## 2019-12-24 DIAGNOSIS — H7203 Central perforation of tympanic membrane, bilateral: Secondary | ICD-10-CM | POA: Diagnosis not present

## 2019-12-27 IMAGING — CT CT HEAD W/O CM
3 series · 16 of 47 positions shown, 19 images · non-contrast
Comparison: Brain MRI 05/15/2017

CLINICAL DATA: Multiple syncopal episodes.

EXAM:
CT HEAD WITHOUT CONTRAST
TECHNIQUE: Contiguous axial images were obtained from the base of the skull
through the vertex without intravenous contrast.

[Series 3: head 5.0 h30s · axial · 0.41mm/px · z∈[-133,+7]mm · 10 of 34 slices shown, 13 images]
[im 3/34  brain]
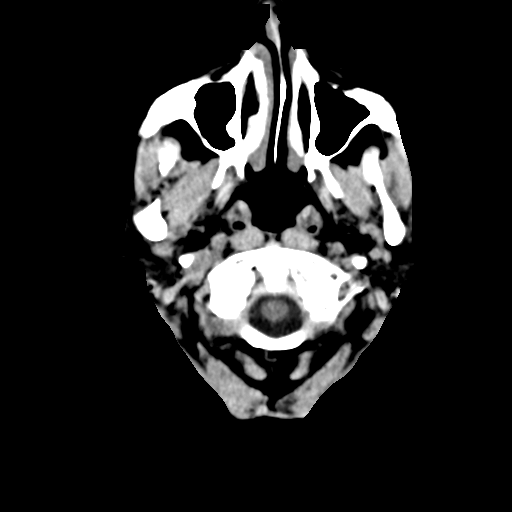
[im 3/34  bone]
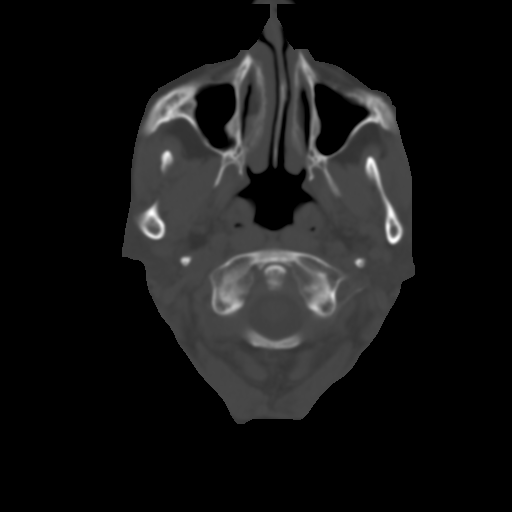
[im 6/34  brain]
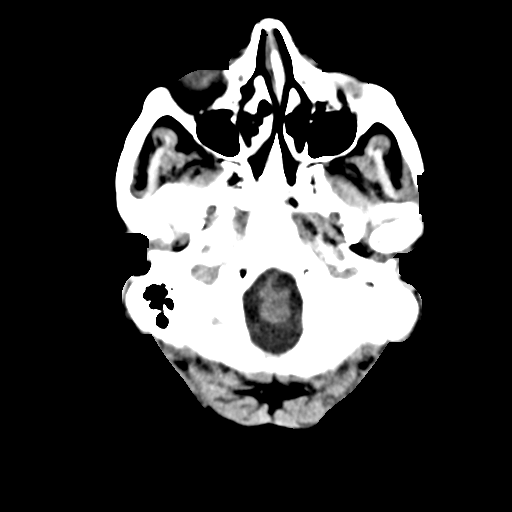
[im 10/34  brain]
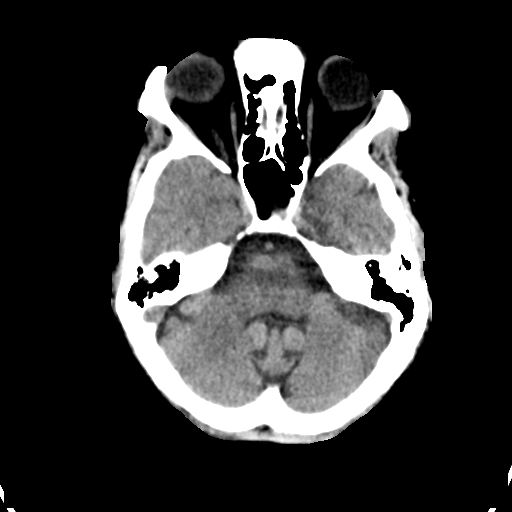
[im 12/34  brain]
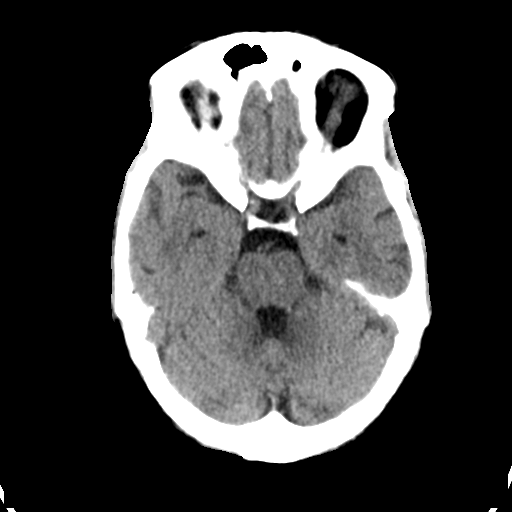
[im 15/34  brain]
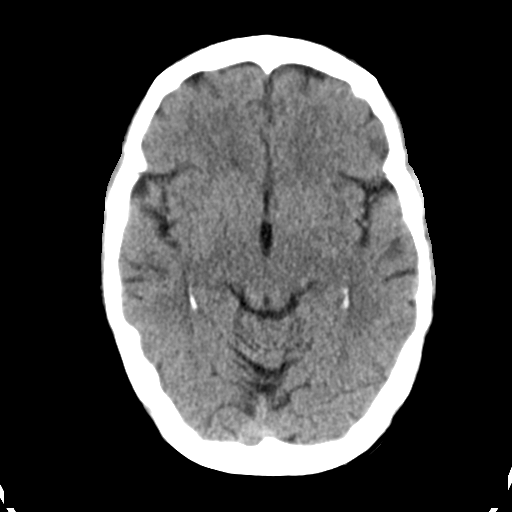
[im 15/34  bone]
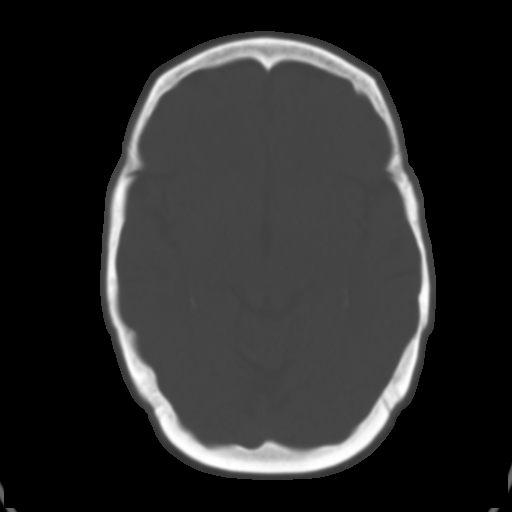
[im 19/34  brain]
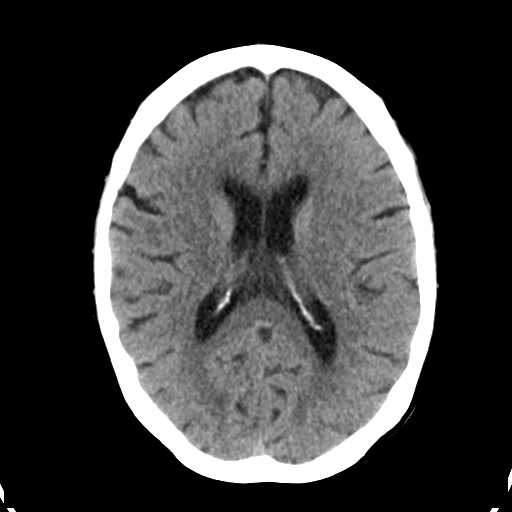
[im 22/34  brain]
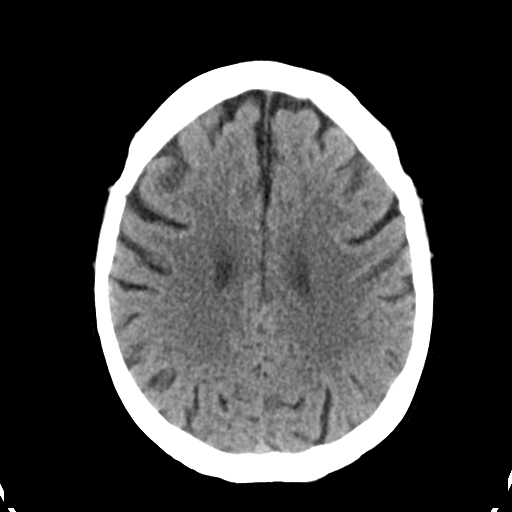
[im 26/34  brain]
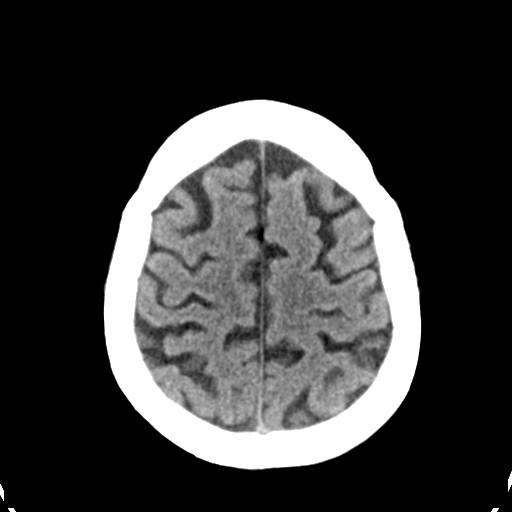
[im 28/34  brain]
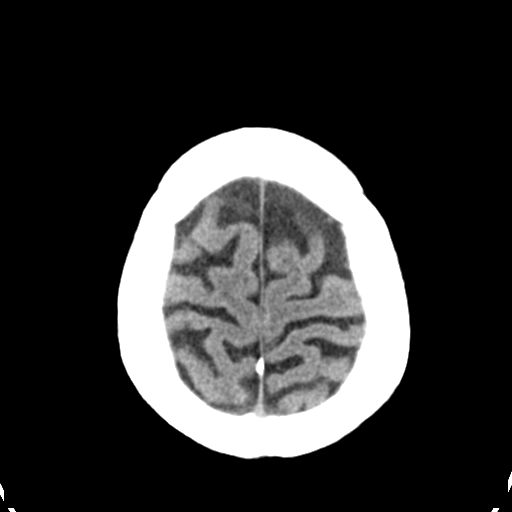
[im 28/34  bone]
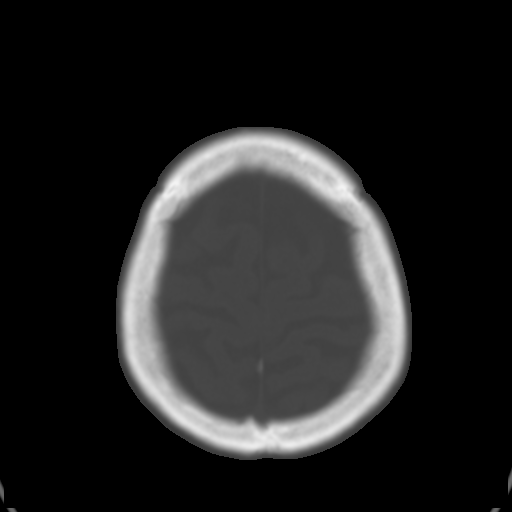
[im 31/34  brain]
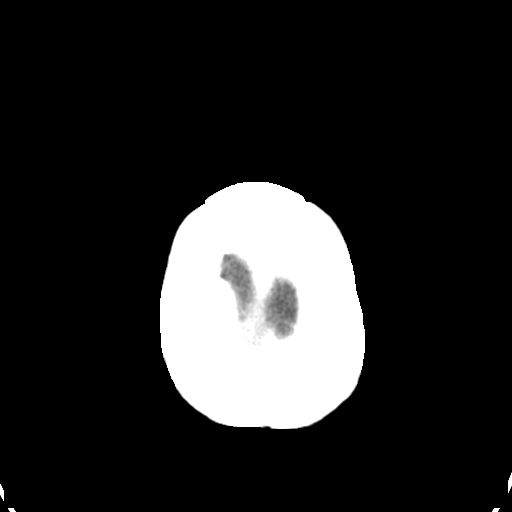

[Series 5: head 3.0 mpr cor · coronal · 0.33mm/px · 3 of 67 slices shown]
[im 23/67  brain]
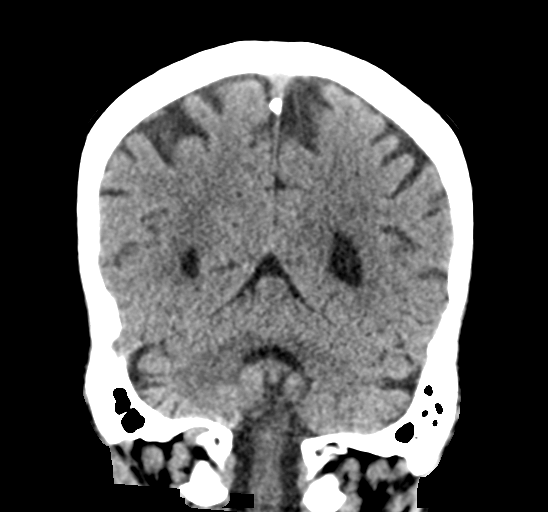
[im 30/67  brain]
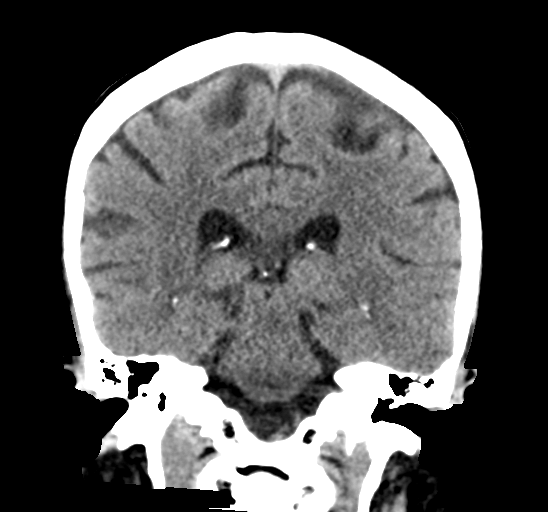
[im 37/67  brain]
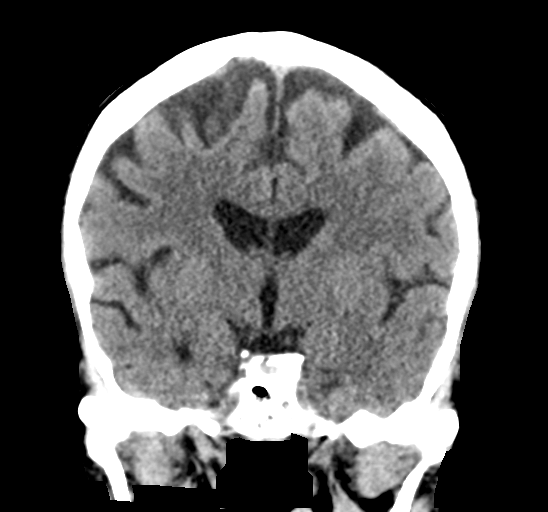

[Series 6: head 3.0 mpr sag · sagittal · 0.33mm/px · 3 of 55 slices shown]
[im 19/55  brain]
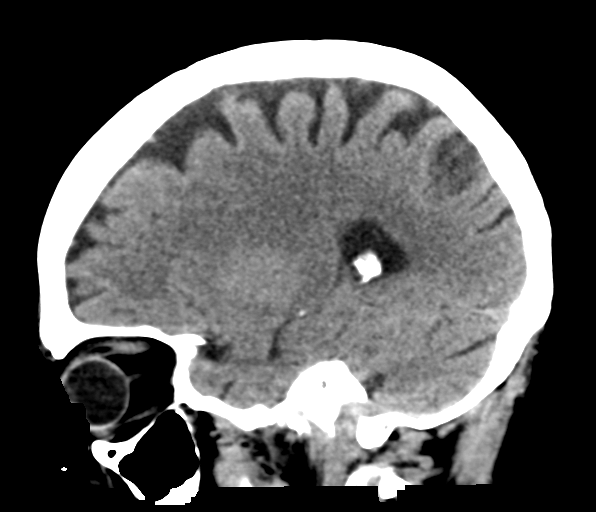
[im 28/55  brain]
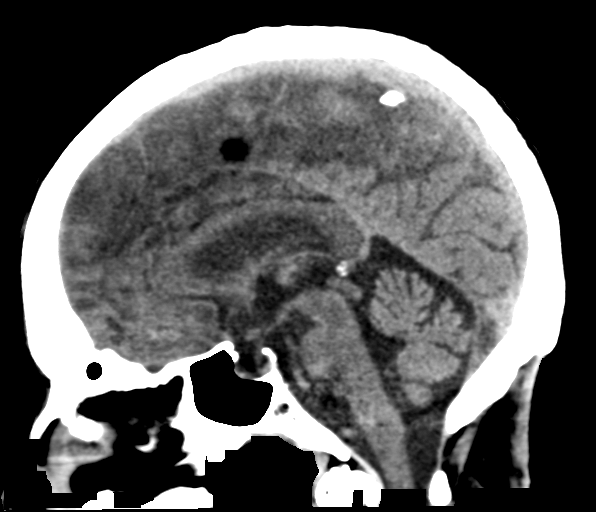
[im 37/55  brain]
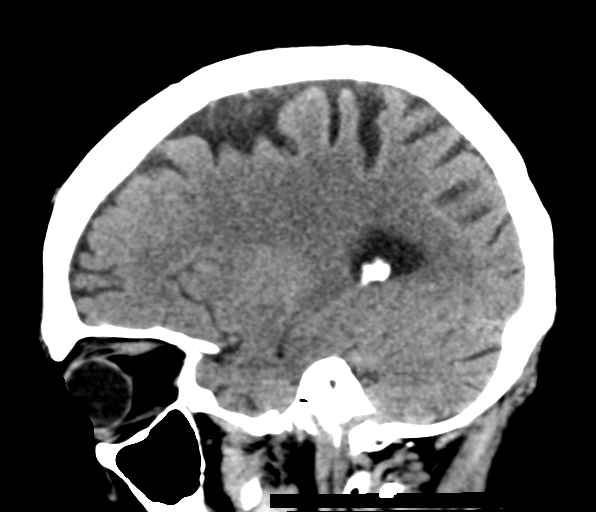

[16 of 47 positions shown; findings below may reference images not displayed]

FINDINGS: Brain: No mass lesion, intraparenchymal hemorrhage or extra-axial
collection. No evidence of acute cortical infarct. Normal appearance
of the brain parenchyma and extra axial spaces for age.

Vascular: No hyperdense vessel or unexpected vascular calcification.

Skull: Normal visualized skull base, calvarium and extracranial soft
tissues.

Sinuses/Orbits: No sinus fluid levels or advanced mucosal
thickening. No mastoid effusion. Normal orbits.
IMPRESSION: Normal head CT.

## 2019-12-27 IMAGING — CT CT ANGIO CHEST
2 of 9 series · 19 of 36 positions shown · IV contrast (iopamidol)
Comparison: PET scan of February 26, 2017. CT scan of November 26, 2016.

CLINICAL DATA: Shortness of breath, syncope. History of lung
cancer.

EXAM:
CT ANGIOGRAPHY CHEST WITH CONTRAST
TECHNIQUE: Multidetector CT imaging of the chest was performed using the
standard protocol during bolus administration of intravenous
contrast. Multiplanar CT image reconstructions and MIPs were
obtained to evaluate the vascular anatomy.
CONTRAST:  70 mL IOGZ05-M27 IOPAMIDOL (IOGZ05-M27) INJECTION 76%

[Series 7: thins · axial · 0.65mm/px · z∈[-533,-267]mm · 18 of 298 slices shown]
[im 16/298  lung]
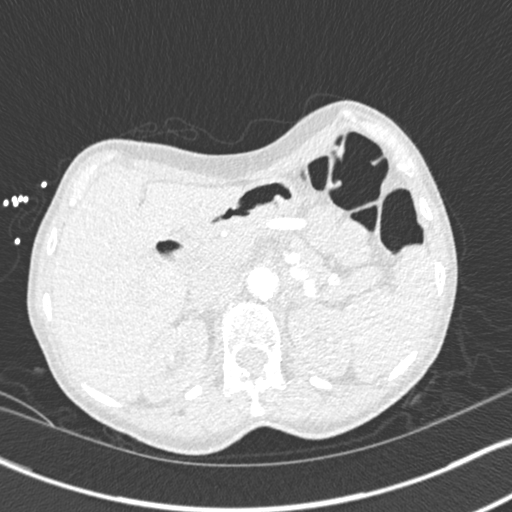
[im 32/298  mediastinal]
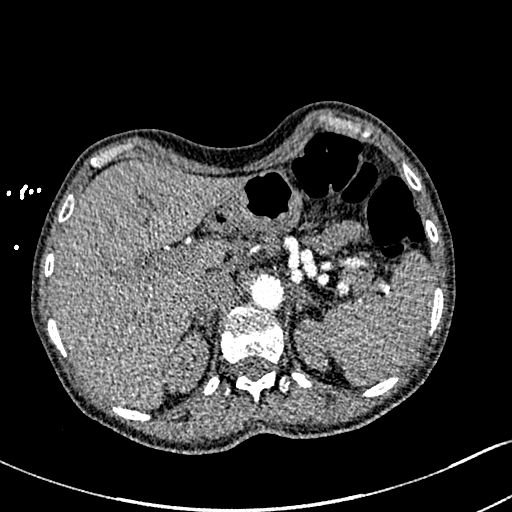
[im 47/298  lung]
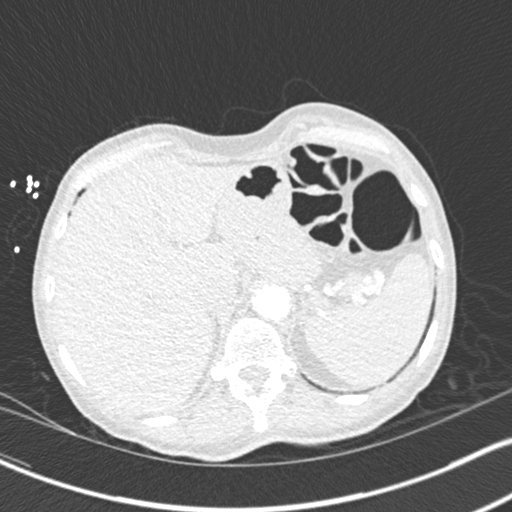
[im 63/298  mediastinal]
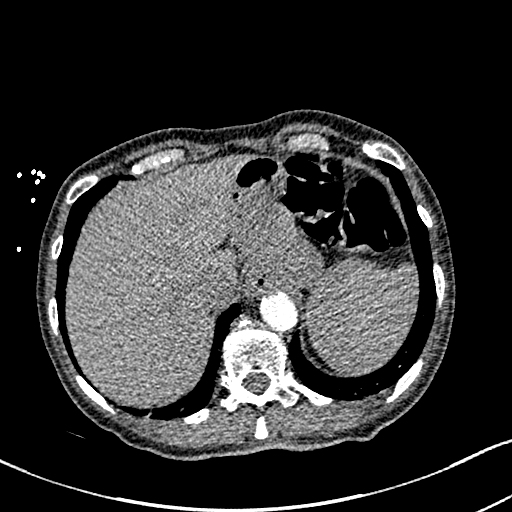
[im 79/298  lung]
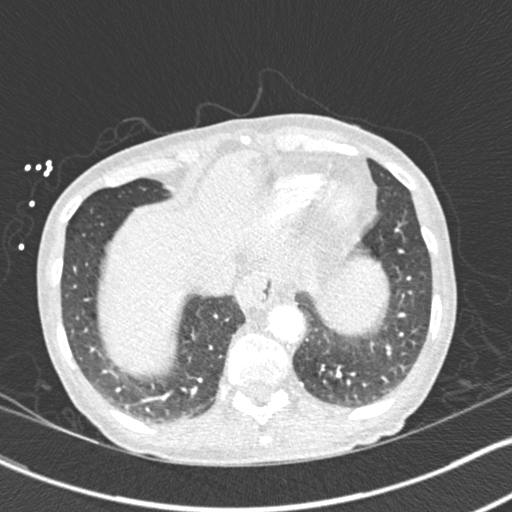
[im 94/298  mediastinal]
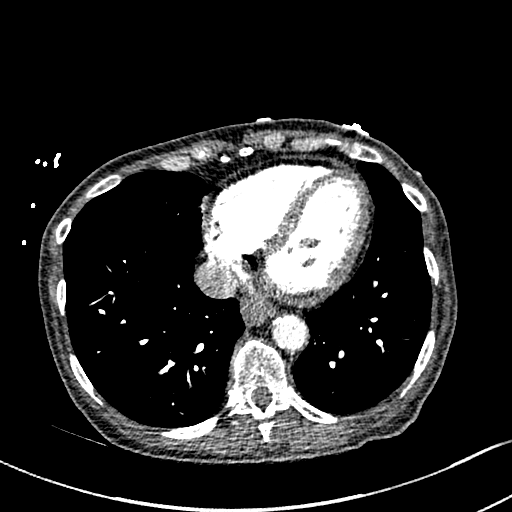
[im 110/298  lung]
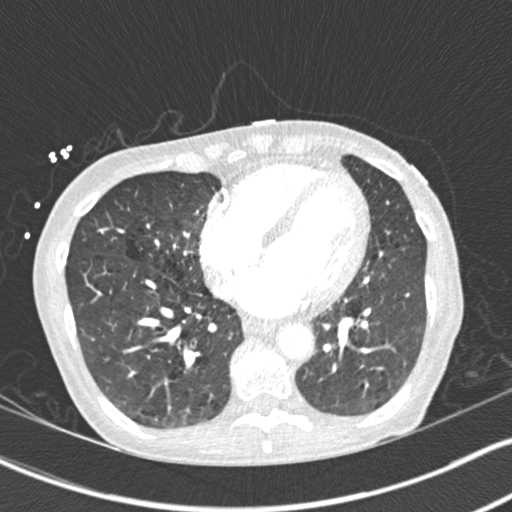
[im 126/298  mediastinal]
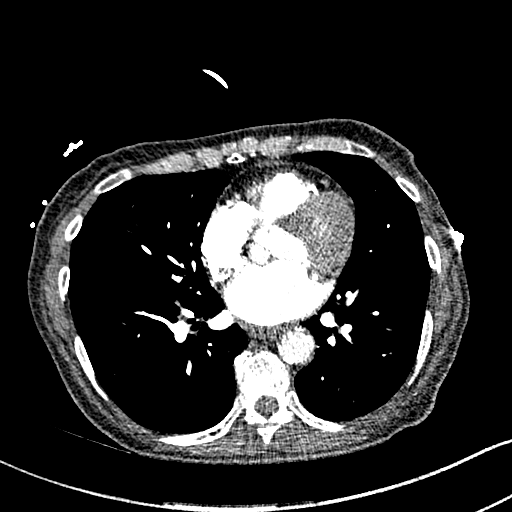
[im 141/298  lung]
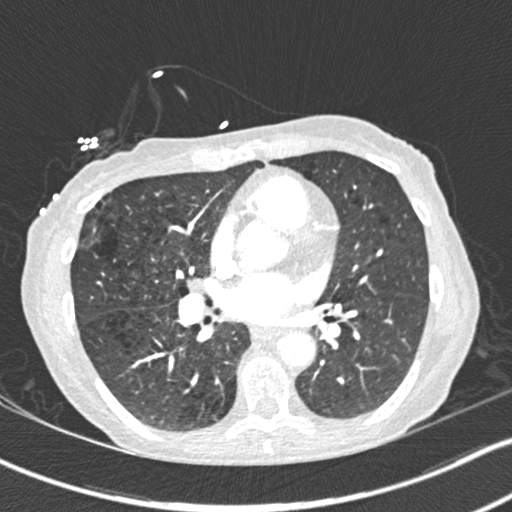
[im 157/298  mediastinal]
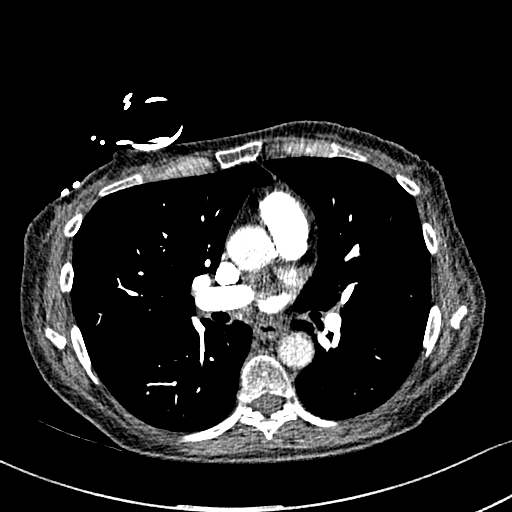
[im 172/298  lung]
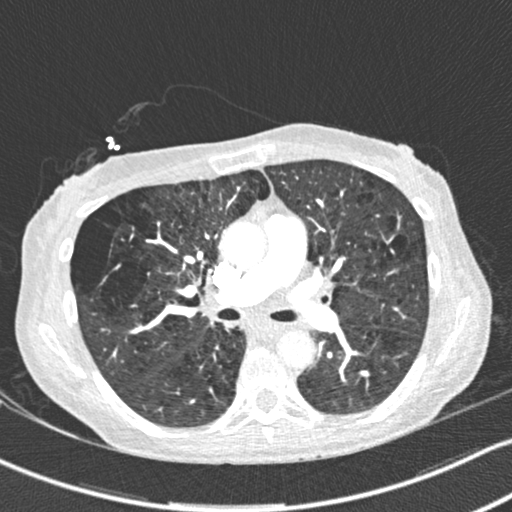
[im 188/298  mediastinal]
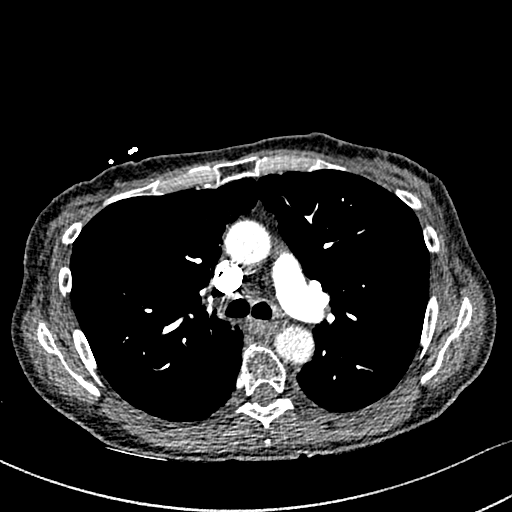
[im 204/298  lung]
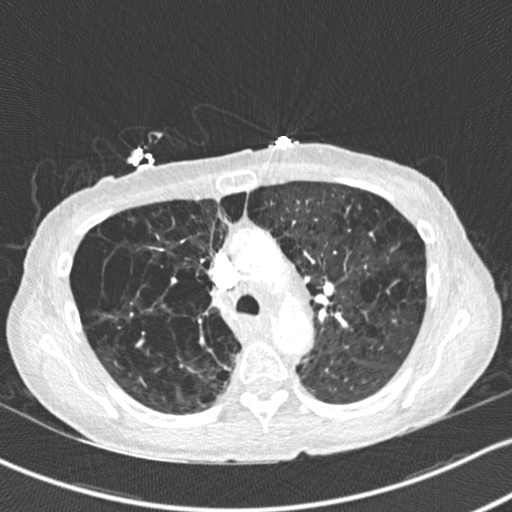
[im 219/298  mediastinal]
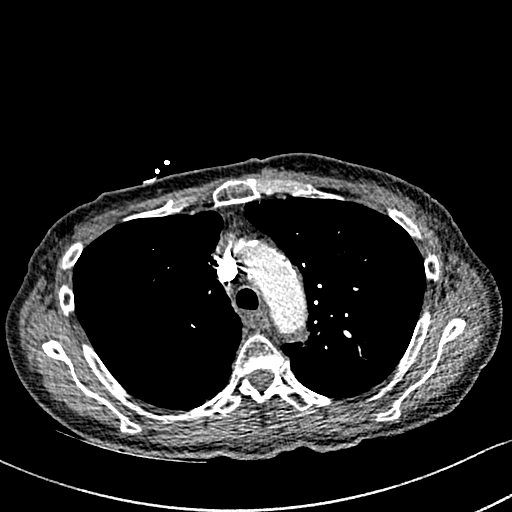
[im 235/298  lung]
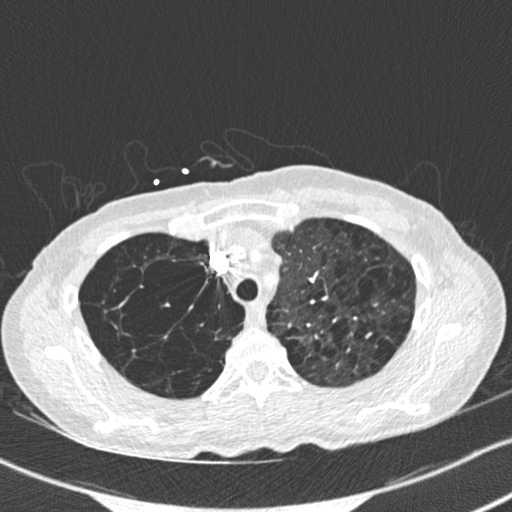
[im 251/298  mediastinal]
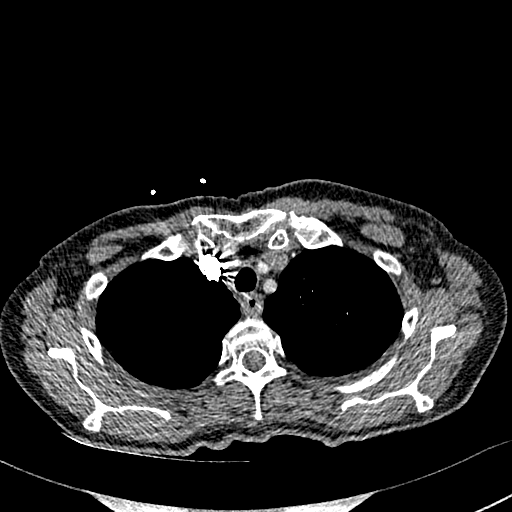
[im 266/298  lung]
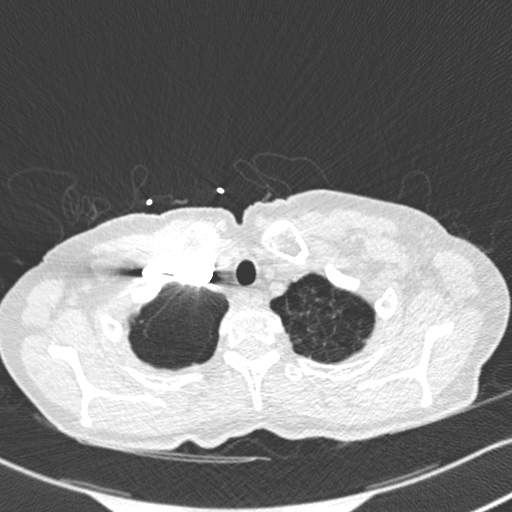
[im 282/298  mediastinal]
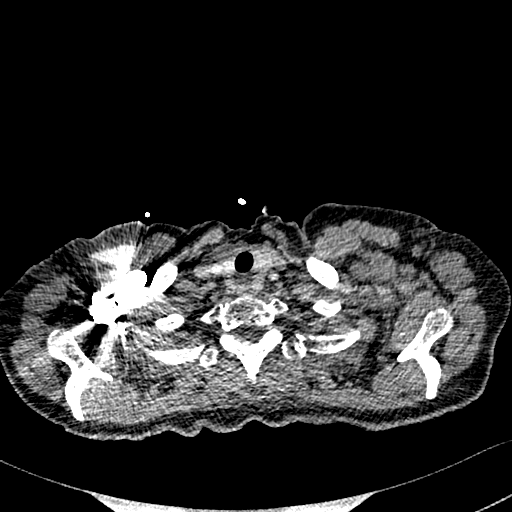

[Series 9: coronal mpr · coronal · 0.59mm/px · 1 of 121 slices shown]
[im 61/121  mediastinal]
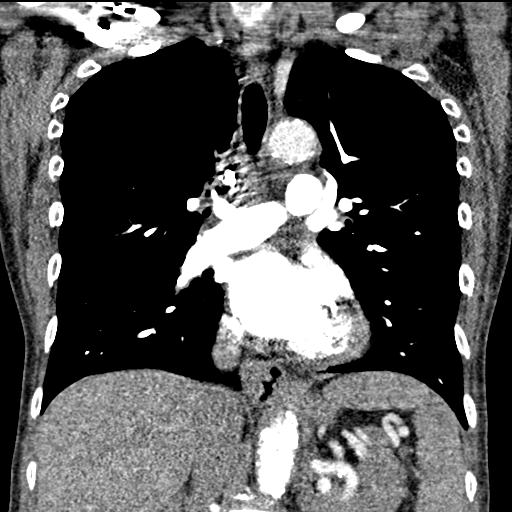

[19 of 36 positions shown; findings below may reference images not displayed]

FINDINGS: Cardiovascular: Satisfactory opacification of the pulmonary arteries
to the segmental level. No evidence of pulmonary embolism. Normal
heart size. No pericardial effusion. Atherosclerosis of thoracic
aorta is noted without aneurysm formation.

Mediastinum/Nodes: No enlarged mediastinal, hilar, or axillary lymph
nodes. Thyroid gland, trachea, and esophagus demonstrate no
significant findings.

Lungs/Pleura: Severe emphysematous disease is noted in the upper
lobes. No pneumothorax or pleural effusion is noted. No acute
pulmonary disease is noted.

Upper Abdomen: No acute abnormality.

Musculoskeletal: No chest wall abnormality. No acute or significant
osseous findings.

Review of the MIP images confirms the above findings.
IMPRESSION: No definite evidence of pulmonary embolus.

Aortic Atherosclerosis ([id number].H) and Emphysema ([id number].U).

## 2019-12-28 ENCOUNTER — Other Ambulatory Visit: Payer: Self-pay

## 2019-12-28 ENCOUNTER — Encounter: Payer: Self-pay | Admitting: Cardiology

## 2019-12-28 ENCOUNTER — Ambulatory Visit (INDEPENDENT_AMBULATORY_CARE_PROVIDER_SITE_OTHER): Payer: Medicare HMO | Admitting: Cardiology

## 2019-12-28 VITALS — BP 122/80 | HR 77 | Temp 98.1°F | Ht 65.0 in | Wt 129.8 lb

## 2019-12-28 DIAGNOSIS — I2119 ST elevation (STEMI) myocardial infarction involving other coronary artery of inferior wall: Secondary | ICD-10-CM

## 2019-12-28 DIAGNOSIS — Z9861 Coronary angioplasty status: Secondary | ICD-10-CM | POA: Diagnosis not present

## 2019-12-28 DIAGNOSIS — R002 Palpitations: Secondary | ICD-10-CM

## 2019-12-28 DIAGNOSIS — E785 Hyperlipidemia, unspecified: Secondary | ICD-10-CM | POA: Diagnosis not present

## 2019-12-28 DIAGNOSIS — I714 Abdominal aortic aneurysm, without rupture, unspecified: Secondary | ICD-10-CM

## 2019-12-28 DIAGNOSIS — I2510 Atherosclerotic heart disease of native coronary artery without angina pectoris: Secondary | ICD-10-CM | POA: Diagnosis not present

## 2019-12-28 DIAGNOSIS — R600 Localized edema: Secondary | ICD-10-CM

## 2019-12-28 DIAGNOSIS — I2089 Other forms of angina pectoris: Secondary | ICD-10-CM

## 2019-12-28 DIAGNOSIS — I208 Other forms of angina pectoris: Secondary | ICD-10-CM

## 2019-12-28 DIAGNOSIS — Z01818 Encounter for other preprocedural examination: Secondary | ICD-10-CM | POA: Diagnosis not present

## 2019-12-28 NOTE — Patient Instructions (Signed)
Medication Instructions:  Your physician recommends that you continue on your current medications as directed. Please refer to the Current Medication list given to you today.  *If you need a refill on your cardiac medications before your next appointment, please call your pharmacy*  Lab Work: Your physician recommends that you return for a FASTING lipid profile in 2-3 weeks:  DO NOT EAT OR DRINK PAST MIDNIGHT If you have labs (blood work) drawn today and your tests are completely normal, you will receive your results only by: [name] MyChart Message (if you have MyChart) OR . A paper copy in the mail If you have any lab test that is abnormal or we need to change your treatment, we will call you to review the results.  Testing/Procedures: NONE ordered at this time of appointment   Follow-Up: At [facility], you and your health needs are our priority.  As part of our continuing mission to provide you with exceptional heart care, we have created designated Provider Care Teams.  These Care Teams include your primary Cardiologist (physician) and Advanced Practice Providers (APPs -  Physician Assistants and Nurse Practitioners) who all work together to provide you with the care you need, when you need it.   Your next appointment:   6 month(s)  The format for your next appointment:   In Person  Provider:   Glenetta Hew, MD  Other Instructions  You are cleared for Knee injections  STOP Plavix 5 days before injections
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
## 2019-12-31 ENCOUNTER — Encounter: Payer: Self-pay | Admitting: Cardiology

## 2019-12-31 DIAGNOSIS — Z01818 Encounter for other preprocedural examination: Secondary | ICD-10-CM | POA: Insufficient documentation

## 2019-12-31 DIAGNOSIS — E785 Hyperlipidemia, unspecified: Secondary | ICD-10-CM | POA: Diagnosis not present

## 2019-12-31 LAB — LIPID PANEL
Chol/HDL Ratio: 3.5 ratio (ref 0.0–4.4)
Cholesterol, Total: 156 mg/dL (ref 100–199)
HDL: 44 mg/dL (ref >39)
LDL Chol Calc (NIH): 92 mg/dL (ref 0–99)
Triglycerides: 111 mg/dL (ref 0–149)
VLDL Cholesterol Cal: 20 mg/dL (ref 5–40)

## 2019-12-31 NOTE — Assessment & Plan Note (Signed)
Palpitations pretty well controlled.  Not a major issue now.  We are trying to avoid blood pressure medications including beta-blockers.  She has had 3 monitors now which did not show anything concerning.  I do not want to treat psychosomatic symptoms.

## 2019-12-31 NOTE — Assessment & Plan Note (Signed)
Michele [name] is not symptomatic from a cardiac standpoint.  She is stable.  Only tolerating Imdur.  She is on every other day Plavix.  I would not do any testing on her prior to surgery, let alone knee injection.  Knee surgeries or procedures would be considered low risk, and no further testing will be required.  She is on maintenance Plavix every other day which can be held 507 days preop for any procedures or surgeries.

## 2019-12-31 NOTE — Assessment & Plan Note (Signed)
She is wearing her support stockings and they seem to be doing well.  She says that she is having swelling in her foot but that seems to be the site of injury.  I do not see any edema on exam.  Would be reluctant to put her on any diuretic.  We talked about elevating her feet and wearing support stockings.  She asked what weight and I recommended the 10-20 pound weight stockings, and if tolerated increase up to the 20-30 pound

## 2019-12-31 NOTE — Assessment & Plan Note (Signed)
Stable symptoms.  Nothing active or worrisome.  Since she is having upcoming surgery, I suggested that she could potentially increase her Imdur 60 mg leading up to her surgery.

## 2019-12-31 NOTE — Assessment & Plan Note (Signed)
She has been on pravastatin for long time but we have not gotten her here to get labs drawn.  We will again give her lab slips to get fasting lipid panel and chemistries.

## 2019-12-31 NOTE — Assessment & Plan Note (Signed)
Extensive PCI with occluded vessels now.  She is on maintenance dose Plavix but she is taking every other day.  As was noted in my previous clinic note.  It is okay to hold Plavix for surgeries or procedures 5 to 7 days preop.  What this would mean is that 5 days prior to this surgery she would not take Plavix even though she is only taking it every other day.  It took for time to explain to her that she does not need to stop for 5 doses of Plavix simply 5 days prior to surgery

## 2020-01-03 ENCOUNTER — Telehealth: Payer: Self-pay | Admitting: Pharmacist

## 2020-01-03 ENCOUNTER — Other Ambulatory Visit: Payer: Self-pay | Admitting: Cardiology

## 2020-01-03 ENCOUNTER — Telehealth: Payer: Self-pay | Admitting: Pulmonary Disease

## 2020-01-03 DIAGNOSIS — H16223 Keratoconjunctivitis sicca, not specified as Sjogren's, bilateral: Secondary | ICD-10-CM | POA: Diagnosis not present

## 2020-01-03 DIAGNOSIS — H2513 Age-related nuclear cataract, bilateral: Secondary | ICD-10-CM | POA: Diagnosis not present

## 2020-01-03 DIAGNOSIS — Z720 Tobacco use: Secondary | ICD-10-CM

## 2020-01-03 DIAGNOSIS — H47321 Drusen of optic disc, right eye: Secondary | ICD-10-CM | POA: Diagnosis not present

## 2020-01-03 DIAGNOSIS — H43393 Other vitreous opacities, bilateral: Secondary | ICD-10-CM | POA: Diagnosis not present

## 2020-01-03 DIAGNOSIS — H04123 Dry eye syndrome of bilateral lacrimal glands: Secondary | ICD-10-CM | POA: Diagnosis not present

## 2020-01-03 MED ORDER — NICOTINE 21 MG/24HR TD PT24: 21.0000 mg | MEDICATED_PATCH | Freq: Every day | TRANSDERMAL | 0 refills | Status: DC

## 2020-01-03 NOTE — Telephone Encounter (Signed)
Contacted patient to discuss smoking cessation progress. Patient verbalized that she is going through a lot of stress preparing for her cousin to pass away. Patient reports that she takes her anxiety pills and smokes more when she is under stress. She is currently smoking 1 ppd. Discussed started nicotine patches since Nicotrol inhaler is not covered with her insurance, and patient agreed to start nicotine patches (sent to pharmacy). Also, encouraged patient to find 3 alternative activities to do instead of smoking when a craving occurs. Patient not ready to set a quit date, however will follow-up with patient on 01/10/20 anytime after 12PM.  Lorel Monaco, PharmD PGY2 Ambulatory Care Resident [facility]

## 2020-01-03 NOTE — Telephone Encounter (Signed)
Routing...

## 2020-01-04 DIAGNOSIS — H66012 Acute suppurative otitis media with spontaneous rupture of ear drum, left ear: Secondary | ICD-10-CM | POA: Diagnosis not present

## 2020-01-06 ENCOUNTER — Telehealth: Payer: Self-pay | Admitting: *Deleted

## 2020-01-06 DIAGNOSIS — Z9861 Coronary angioplasty status: Secondary | ICD-10-CM

## 2020-01-06 DIAGNOSIS — E785 Hyperlipidemia, unspecified: Secondary | ICD-10-CM

## 2020-01-06 DIAGNOSIS — I2510 Atherosclerotic heart disease of native coronary artery without angina pectoris: Secondary | ICD-10-CM

## 2020-01-06 MED ORDER — PRAVASTATIN SODIUM 80 MG PO TABS
80.0000 mg | ORAL_TABLET | Freq: Every evening | ORAL | 3 refills | Status: DC
Start: 2020-01-06 — End: 2020-04-03

## 2020-01-06 NOTE — Telephone Encounter (Signed)
I am not sure what this message is about.  Her lipids were not as well-controlled as we would like for them to be controlled.  She is on pravastatin and has been doing okay with it for long time.  Based on her other symptoms, we have simply agreed to stay with this current dose.  LDL goal is less than 70 and were not there yet -> based on the intolerance of medications I am not can add any medicine. However, I did not stop what she is on..  Lab Results  Component Value Date   CHOL 156 12/31/2019   HDL 44 12/31/2019   LDLCALC 92 12/31/2019   LDLDIRECT 92.0 07/03/2011   TRIG 111 12/31/2019   CHOLHDL 3.5 12/31/2019     .Glenetta Hew, MD

## 2020-01-06 NOTE — Telephone Encounter (Signed)
Patient is returning call to discuss medication changes.

## 2020-01-06 NOTE — Telephone Encounter (Signed)
Patient called again, she would like to speak to nurse about her labs as well.

## 2020-01-06 NOTE — Telephone Encounter (Signed)
Called  - left detailed message for medication changes . Patient reviewed result note in mychart.  new prescription sen to pharmacy 80 mg pravastatin   And lab slip mailed  fasting lipid , hepatic panel ( Dec 2021)

## 2020-01-06 NOTE — Telephone Encounter (Signed)
-----   Message from Leonie Man, MD sent at 01/02/2020  2:54 AM EDT ----- Cholesterol panel shows that we are still not really a goal.  We have continued to go up in the past few years.  Triglyceride levels were pretty good.  Plan --> lets increase Pravastatin to 80 mg daily - recheck FLP/LFTs in ~4 months  Glenetta Hew, MD  d/c Pravastatin 40 mg New Rx: Pravastatin 80 mg PO daily; disp #90, 3 refills.  FLP & LFTs - 4 months.

## 2020-01-07 ENCOUNTER — Other Ambulatory Visit: Payer: Self-pay

## 2020-01-07 ENCOUNTER — Ambulatory Visit
Admission: RE | Admit: 2020-01-07 | Discharge: 2020-01-07 | Disposition: A | Discharge status: Home / Self Care | Payer: Medicare HMO | Source: Ambulatory Visit | Attending: Nurse Practitioner | Admitting: Nurse Practitioner

## 2020-01-07 DIAGNOSIS — R109 Unspecified abdominal pain: Secondary | ICD-10-CM

## 2020-01-07 DIAGNOSIS — K449 Diaphragmatic hernia without obstruction or gangrene: Secondary | ICD-10-CM | POA: Diagnosis not present

## 2020-01-07 DIAGNOSIS — M47817 Spondylosis without myelopathy or radiculopathy, lumbosacral region: Secondary | ICD-10-CM | POA: Diagnosis not present

## 2020-01-07 DIAGNOSIS — I708 Atherosclerosis of other arteries: Secondary | ICD-10-CM | POA: Diagnosis not present

## 2020-01-07 DIAGNOSIS — I700 Atherosclerosis of aorta: Secondary | ICD-10-CM | POA: Diagnosis not present

## 2020-01-07 MED ORDER — IOPAMIDOL (ISOVUE-300) INJECTION 61%
100.0000 mL | Freq: Once | INTRAVENOUS | Status: AC | PRN
  Administered 2020-01-07: 100 mL via INTRAVENOUS

## 2020-01-07 NOTE — Telephone Encounter (Signed)
See lab result note from  01/06/20. Left detailed message for patient in regards to  cholesterol

## 2020-01-09 ENCOUNTER — Telehealth: Payer: Self-pay | Admitting: Student

## 2020-01-09 NOTE — Telephone Encounter (Signed)
Fun call --  Yes - I did want her to try higher dose of Pravachol - Lipids not @ goal & did not want to start new medication. She needs to try to take it -- if after a few weeks still has Sx - will need to go back to 40mg  & try Zetia 10 mg.   I have not written for Lisinopril for her.  She has had low BP issues -- not inclined to make this worse.    Glenetta Hew, MD

## 2020-01-09 NOTE — Telephone Encounter (Signed)
   Received page from Answering Service that patient had concerns about medications. Called and spoke with patient. She states she Pravastatin was recently increased from 40mg  to 80mg . She reports that she has previously had myalgias with higher doses of statins and is very concerned about this. Recommended retrying higher dose as recommended by Dr. [name] and if she has recurrent myalgias to let us know. May need to try Zetia at that point but would defer to Dr. [name].  She also states Pharmacy told her she had a prescription of Lisinopril to pick up. I reviewed chart and do not see this mentioned in Dr. Allison Quarry last note and do not even seen that this has been ordered in our system. Looks like BP has been well controlled so I cannot tell why this was ordered. Recommended confirming with Pharmacy who ordered. I told her OK to hold off on starting Lisinopril for now.  Patient very concerned about Pravastatin and Lisinopril and she wanted me to let Dr. [name]. I will route message to Dr. [name] so he is aware.    Of note, patient also requested that we no longer communicate through [place]. She would prefer we call her.  Darreld Mclean, PA-C 01/09/2020 12:17 PM

## 2020-01-10 ENCOUNTER — Telehealth: Payer: Self-pay | Admitting: Pharmacist

## 2020-01-10 DIAGNOSIS — Z7189 Other specified counseling: Secondary | ICD-10-CM | POA: Insufficient documentation

## 2020-01-10 NOTE — Telephone Encounter (Addendum)
Contacted patient to discuss smoking cessation progress. Patient continues to verbalize that she is going through a lot of stress and is worried about her health. She is now smoking 1-1.5 ppd. She received a denial letter from [facility] which stated that her insurance will not cover Nicotrol inhaler. Informed patient that nicotine patches may be beneficial and that the prescription has been sent to her pharmacy. At this moment, patient is not ready to set a quit date.  Lorel Monaco, PharmD PGY2 Ambulatory Care Resident [facility]

## 2020-01-11 ENCOUNTER — Telehealth: Payer: Self-pay | Admitting: Cardiology

## 2020-01-11 DIAGNOSIS — R350 Frequency of micturition: Secondary | ICD-10-CM | POA: Diagnosis not present

## 2020-01-11 DIAGNOSIS — N393 Stress incontinence (female) (male): Secondary | ICD-10-CM | POA: Diagnosis not present

## 2020-01-11 NOTE — Telephone Encounter (Signed)
Spoke with patient about recent lab results. Explained that goal LDL is under 70 given CAD history. Explained that pravastatin was increased due to this. She states she has taken higher dose pravastatin before and had muscle aches/pains. She is worried about this. Will make MD aware

## 2020-01-11 NOTE — Telephone Encounter (Signed)
Pt c/o medication issue:  1. Name of Medication: pravastatin (PRAVACHOL) 80 MG tablet  2. How are you currently taking this medication (dosage and times per day)?   3. Are you having a reaction (difficulty breathing--STAT)? No   4. What is your medication issue? Michele [name] is calling requesting a nurse call her to discuss this medication. She states she is confused by what was said in [place] about it as well as the information on her LDL. Please advise.

## 2020-01-11 NOTE — Telephone Encounter (Signed)
I am certain she will not increase the dose, but do at least a that were trying to. Glenetta Hew, MD

## 2020-01-16 IMAGING — MR MR HEAD WO/W CM
11 series · 48 of 48 positions shown · IV contrast (Multihance 10ml)
Comparison: [facility] brain MRIs 05/15/2017 and earlier.
Noncontrast head CT 08/29/2017.

CLINICAL DATA: 61-year-old female with small cell lung cancer
status post prophylactic whole brain radiation performed in [REDACTED]
and July 2016. Restaging. Subsequent encounter.

EXAM:
MRI HEAD WITHOUT AND WITH CONTRAST
TECHNIQUE: Multiplanar, multiecho pulse sequences of the brain and surrounding
structures were obtained without and with intravenous contrast.
CONTRAST:  10mL MULTIHANCE GADOBENATE DIMEGLUMINE 529 MG/ML IV SOLN

[Series 3: FLAIR · sagittal · 3.0mm · 0.75mm/px · 2 of 39 slices shown (1 of 2)]
[im 1/39]
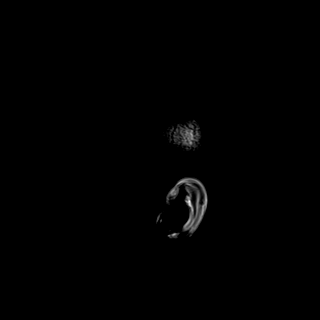
[im 39/39]
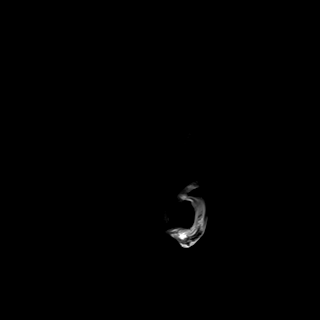

[Series 4: DWI · axial · 3.0mm · 1.50mm/px · z∈[-82,+66]mm · 5 of 78 slices shown (1 of 2)]
[im 1/78]
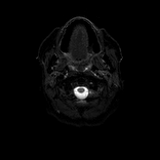
[im 20/78]
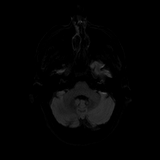
[im 39/78]
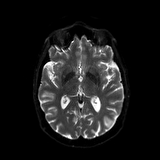
[im 58/78]
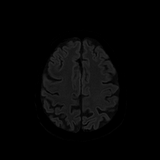
[im 78/78]
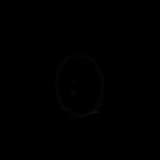

[Series 5: DWI · axial · 3.0mm · 1.50mm/px · z∈[-82,+66]mm · 3 of 39 slices shown (2 of 2)]
[im 1/39]
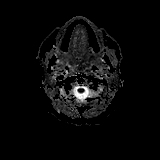
[im 20/39]
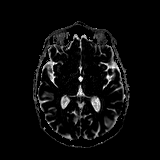
[im 39/39]
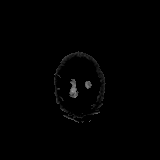

[Series 6: T2 · axial · 5.0mm · 0.57mm/px · z∈[-86,+70]mm · 2 of 27 slices shown]
[im 1/27]
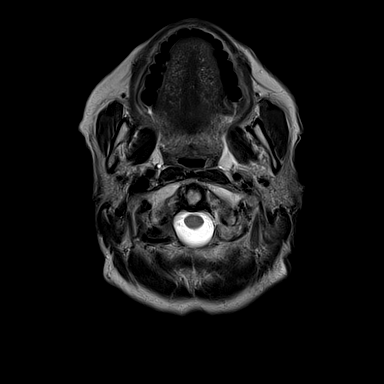
[im 27/27]
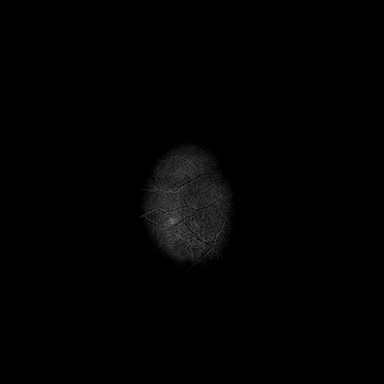

[Series 7: GRE · axial · 5.0mm · 0.57mm/px · z∈[-86,+70]mm · 2 of 27 slices shown]
[im 1/27]
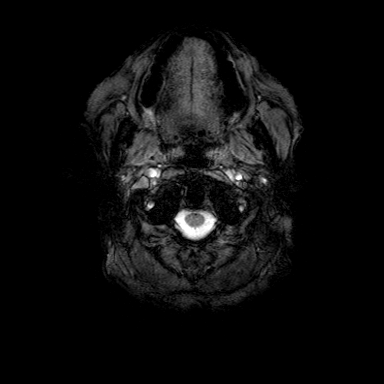
[im 27/27]
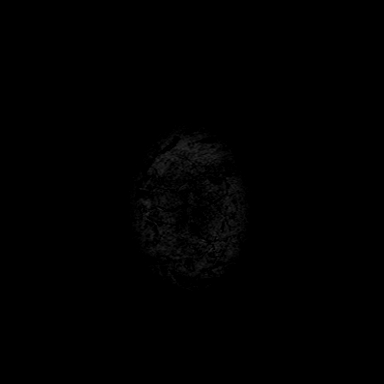

[Series 8: FLAIR · axial · 3.0mm · 0.57mm/px · z∈[-84,+69]mm · 3 of 52 slices shown (2 of 2)]
[im 1/52]
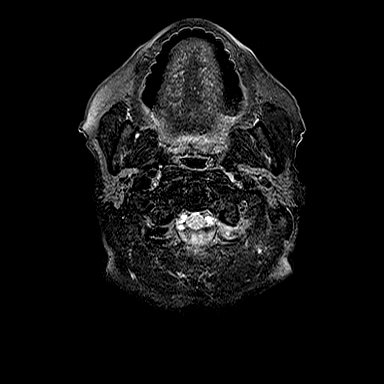
[im 26/52]
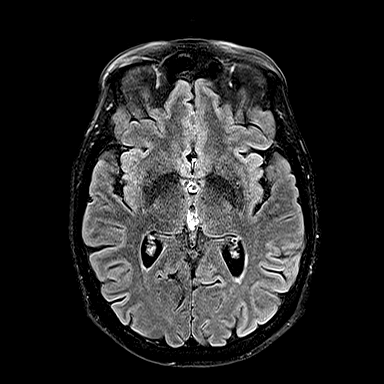
[im 52/52]
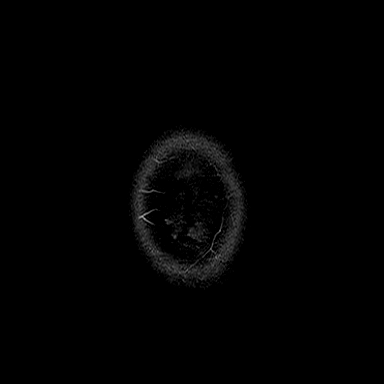

[Series 9: T1 · axial · 1.0mm · 0.75mm/px · z∈[-87,+72]mm · 11 of 160 slices shown]
[im 1/160]
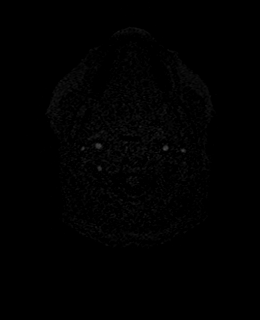
[im 16/160]
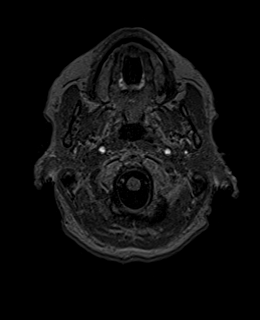
[im 32/160]
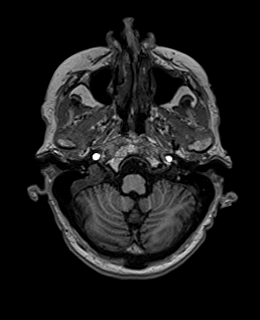
[im 48/160]
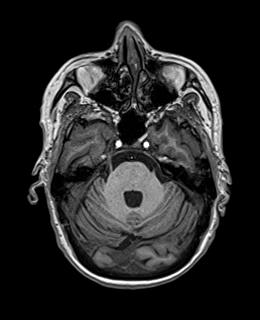
[im 64/160]
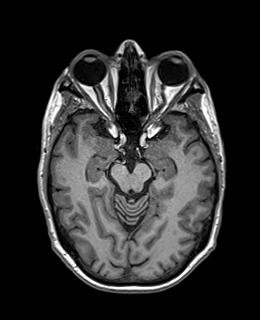
[im 80/160]
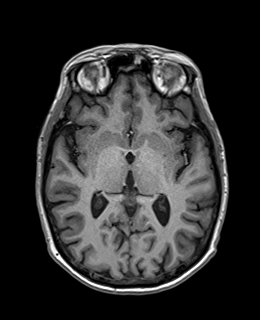
[im 96/160]
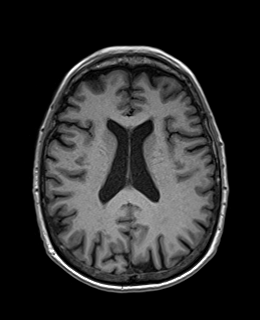
[im 112/160]
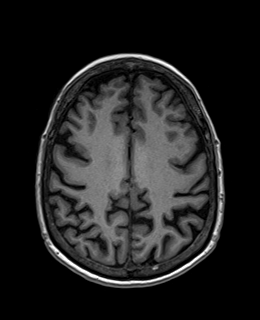
[im 128/160]
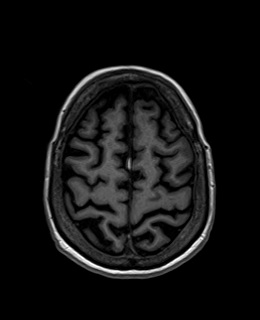
[im 144/160]
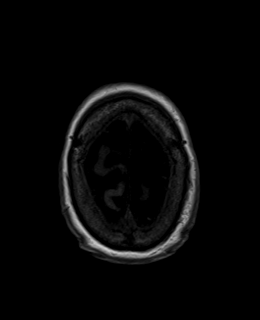
[im 160/160]
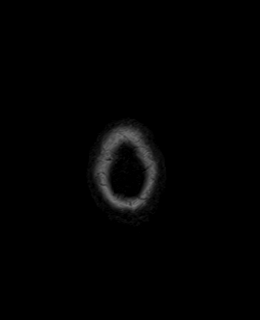

[Series 10: T2 post-contrast · coronal · 3.0mm · 0.57mm/px · 3 of 47 slices shown]
[im 1/47]
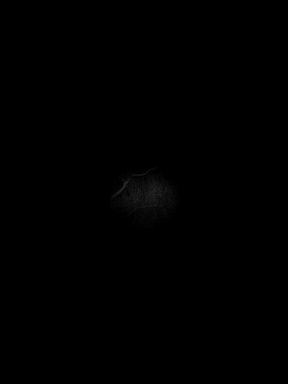
[im 24/47]
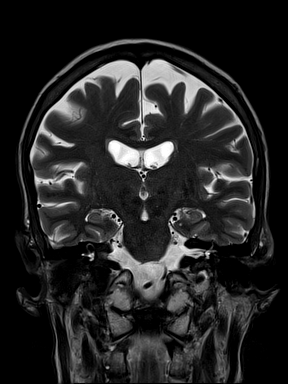
[im 47/47]
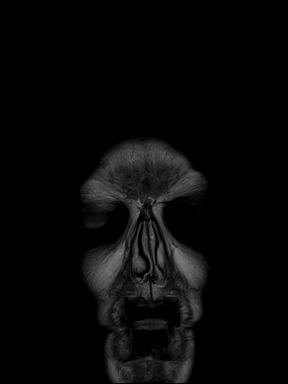

[Series 14: T1 post-contrast · axial · 1.0mm · 0.75mm/px · z∈[-87,+72]mm · 11 of 160 slices shown (1 of 2)]
[im 1/160]
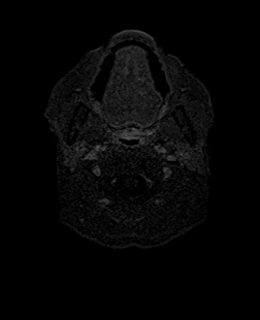
[im 16/160]
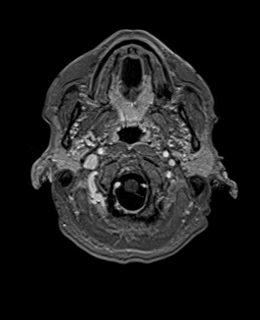
[im 32/160]
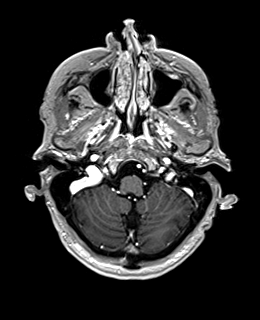
[im 48/160]
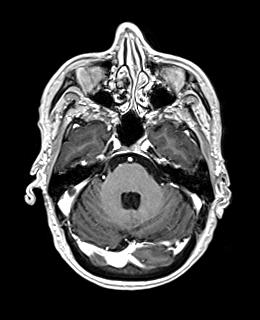
[im 64/160]
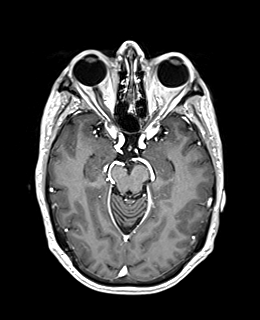
[im 80/160]
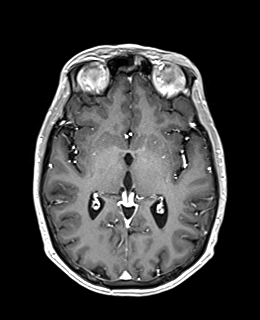
[im 96/160]
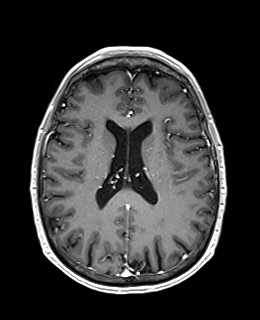
[im 112/160]
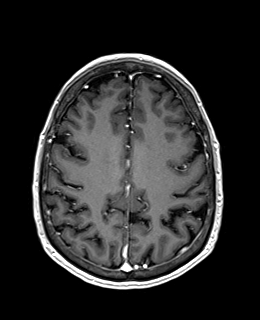
[im 128/160]
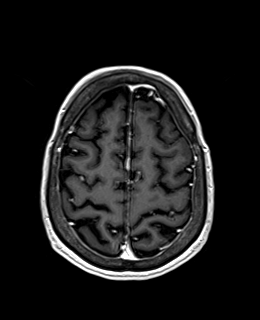
[im 144/160]
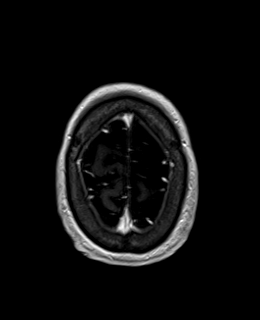
[im 160/160]
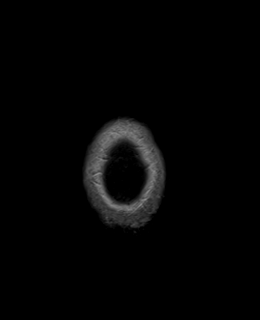

[Series 15: T1 post-contrast · coronal · 3.0mm · 0.57mm/px · 3 of 47 slices shown (2 of 2)]
[im 1/47]
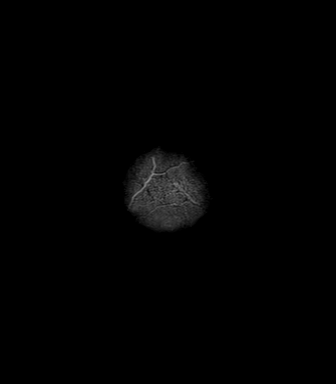
[im 24/47]
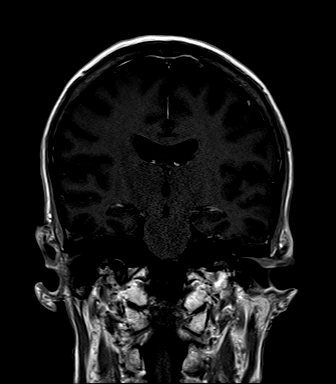
[im 47/47]
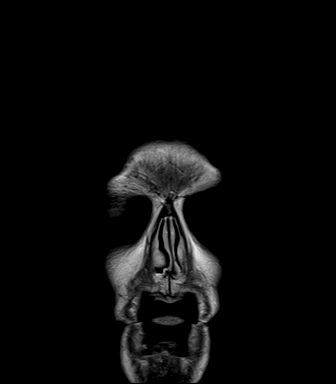

[Series 16: FLAIR post-contrast · sagittal · 3.0mm · 0.75mm/px · 3 of 39 slices shown]
[im 1/39]
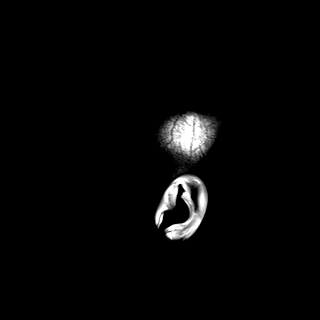
[im 20/39]
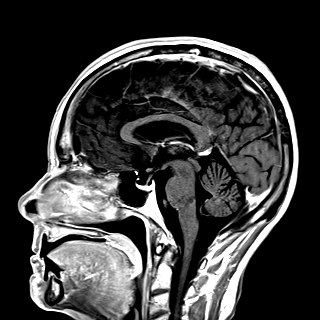
[im 39/39]
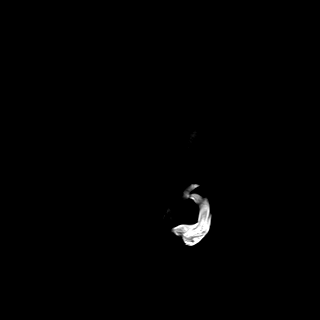

[48 of 48 positions shown; findings below may reference images not displayed]

FINDINGS: Brain: Cerebral volume appears stable since 0108.

No abnormal enhancement identified. No midline shift, mass effect,
or evidence of intracranial mass lesion. No dural thickening.

No restricted diffusion to suggest acute infarction. No
ventriculomegaly, extra-axial collection or acute intracranial
hemorrhage. Cervicomedullary junction and pituitary are within
normal limits. Indistinct periventricular and other small scattered
bilateral cerebral white matter foci of T2 and FLAIR hyperintensity
do appear increased since 0108, but mild overall and are likely the
sequelae of whole brain radiation. No other gray or white matter
signal abnormality identified. No chronic cerebral blood products
identified.

Vascular: Major intracranial vascular flow voids are stable. The
major dural venous sinuses are enhancing and appear patent.

Skull and upper cervical spine: Negative visible cervical spine and
spinal cord. Calvarium bone marrow signal appears stable since 0108
and within normal limits.

Sinuses/Orbits: Stable and negative.

Other: Visible internal auditory structures appear normal. Mastoid
air cells remain well pneumatized aside from minimal fluid on the
left. Negative scalp and face soft tissues.
IMPRESSION: Continued satisfactory post treatment appearance of the brain. No
metastatic disease or acute intracranial abnormality.

## 2020-01-20 ENCOUNTER — Telehealth: Payer: Self-pay | Admitting: Gastroenterology

## 2020-01-20 NOTE — Telephone Encounter (Signed)
The pt complains of continued constipation she states she has not had a Bm in 2 weeks but she is passing gas.  She has been taking 2 doses of miralax daily.  She also has abd pain.  I have advised her to try mag citrate today and continue miralax 2 doses daily.  She has also been scheduled for follow up with Dr Ardis Hughs.  She will call with any further complaints or if the mag citrate does not help.

## 2020-01-23 IMAGING — US US TRANSVAGINAL NON-OB
1 series · 15 of 25 positions shown · non-contrast
Comparison: CT 05/06/2017

CLINICAL DATA: Pelvic pain

EXAM:
ULTRASOUND PELVIS TRANSVAGINAL
TECHNIQUE: Transvaginal ultrasound examination of the pelvis was performed
including evaluation of the uterus, ovaries, adnexal regions, and
pelvic cul-de-sac.

[Series 1: us transvaginal non-ob · 15 of 43 slices shown]
[im 1/43]
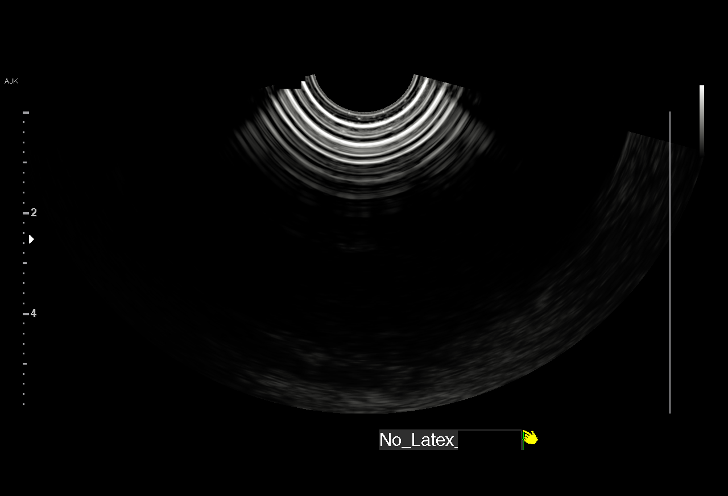
[im 4/43]
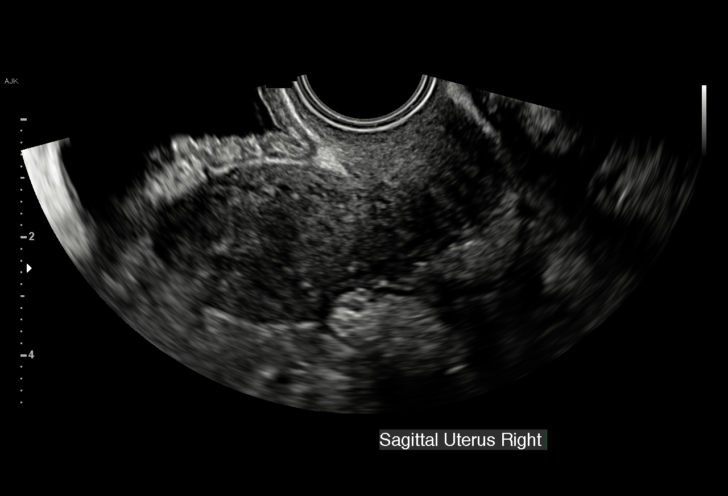
[im 8/43]
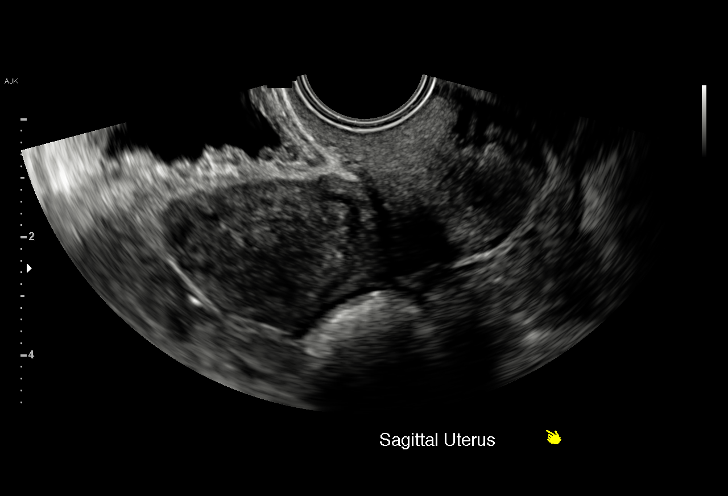
[im 9/43]
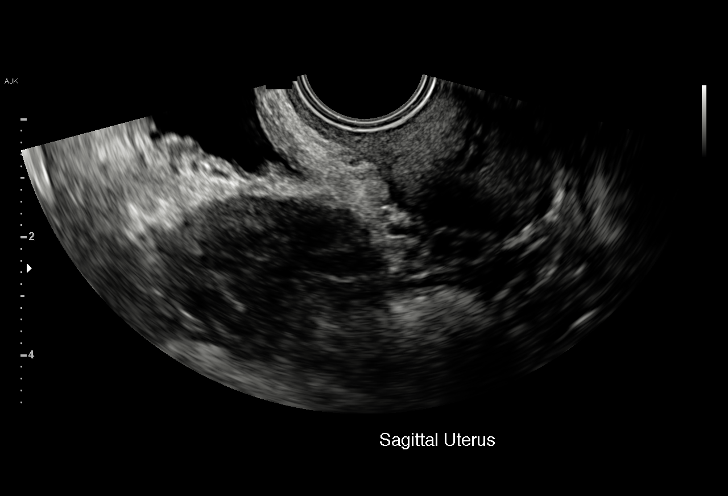
[im 13/43]
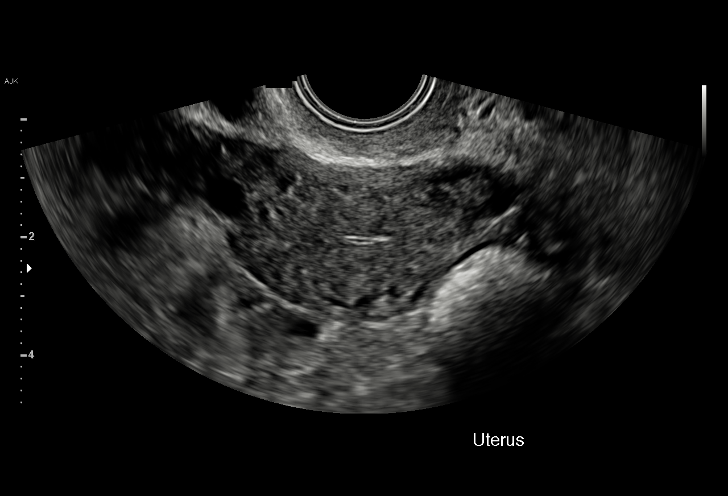
[im 16/43]
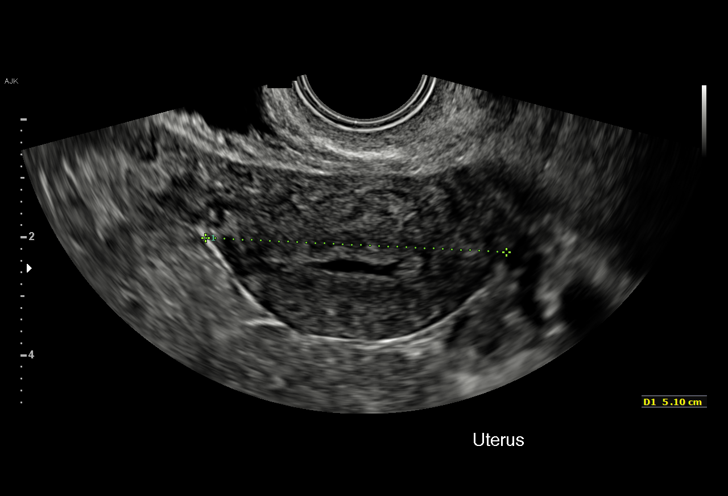
[im 18/43]
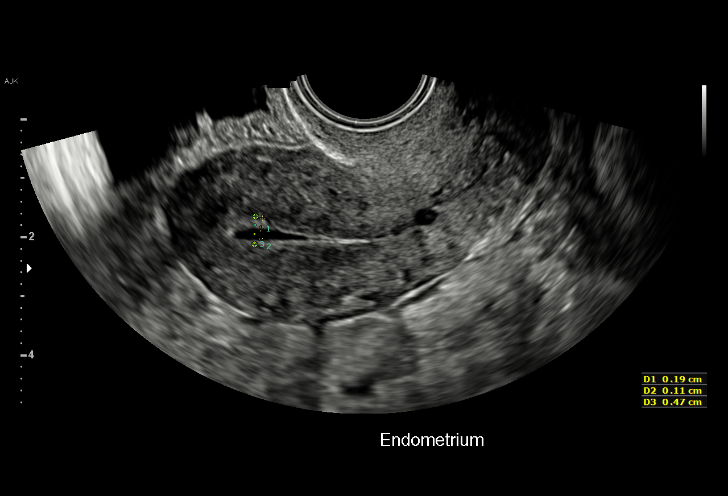
[im 22/43]
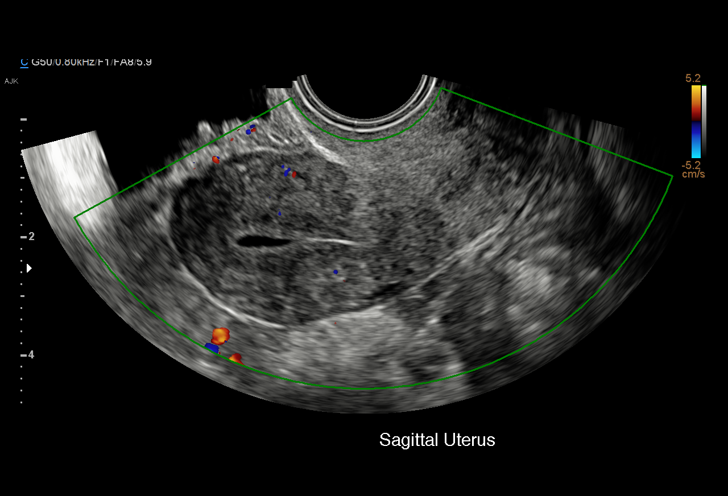
[im 25/43]
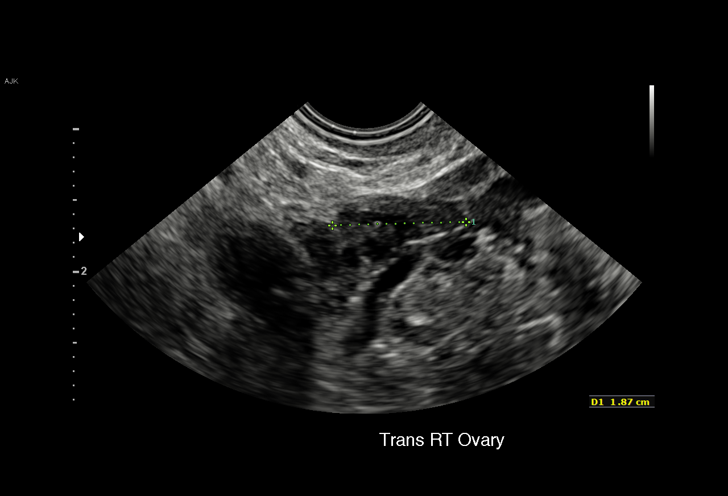
[im 27/43]
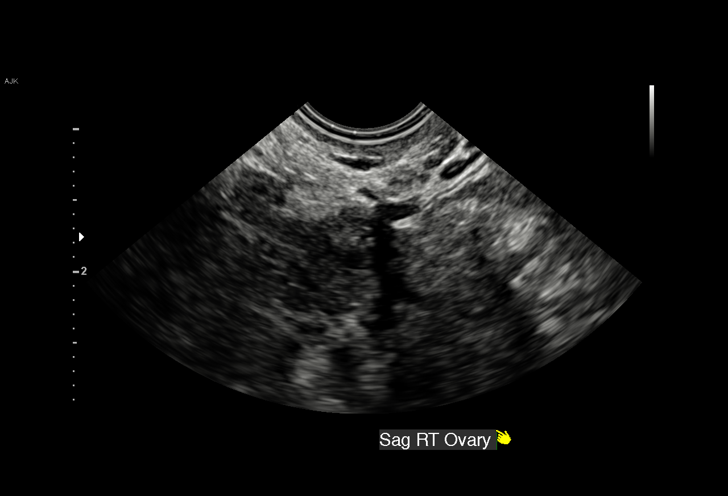
[im 30/43]
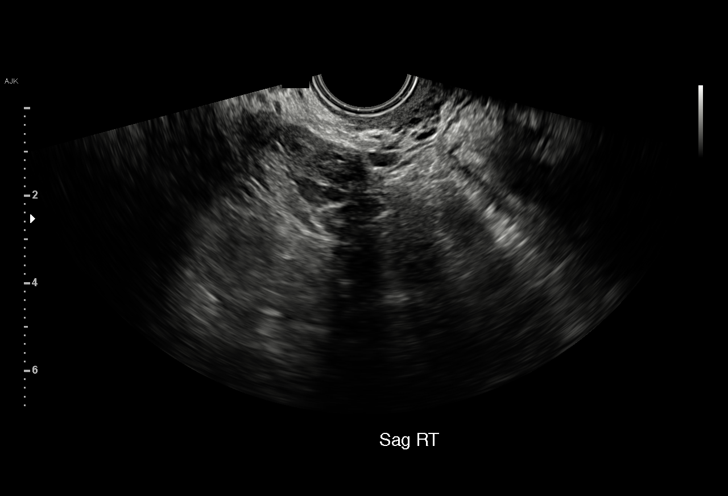
[im 34/43]
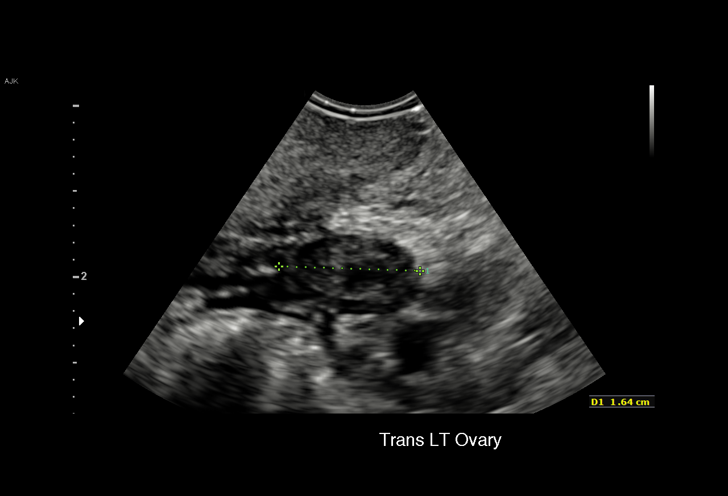
[im 36/43]
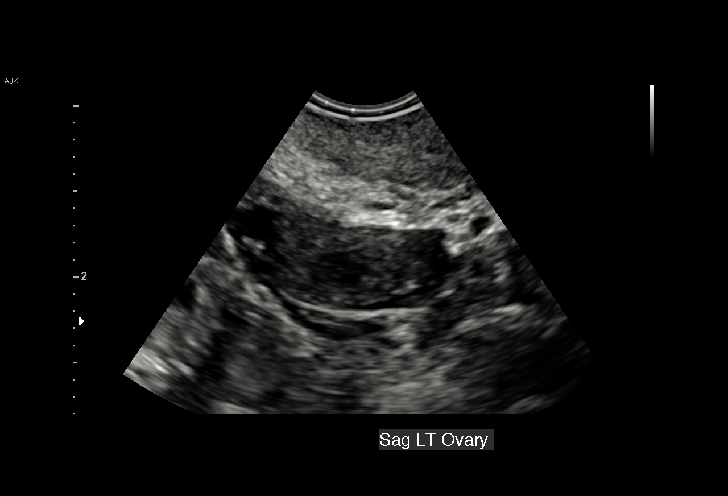
[im 39/43]
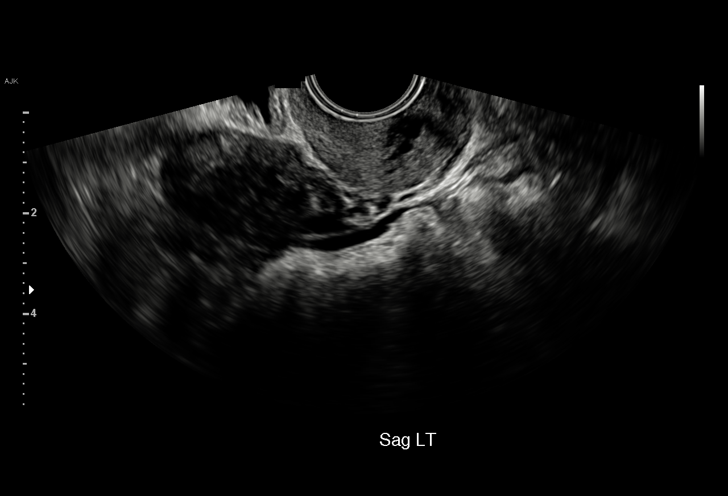
[im 43/43]
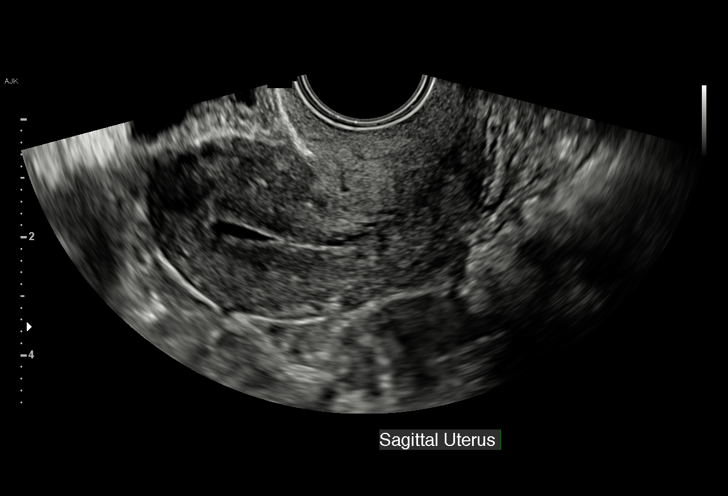

[15 of 25 positions shown; findings below may reference images not displayed]

FINDINGS: Uterus

Measurements: 6.7 x 2.9 x 5.1 cm. No fibroids or other mass
visualized.

Endometrium

Thickness: 3 mm in thickness. Small amount of fluid within the
endometrial canal. No focal abnormality visualized.

Right ovary

Measurements: 1.6 x 1.5 x 1.9 cm. Normal appearance/no adnexal mass.

Left ovary

Measurements: 2.2 x 1.0 x 1.6 cm. Normal appearance/no adnexal mass.

Other findings:  No abnormal free fluid
IMPRESSION: Unremarkable pelvic ultrasound. Small amount of fluid within the
endometrial canal.

## 2020-01-24 ENCOUNTER — Telehealth: Payer: Self-pay | Admitting: Gastroenterology

## 2020-01-24 NOTE — Telephone Encounter (Signed)
Lm on vm for patient to return call 

## 2020-01-24 NOTE — Telephone Encounter (Signed)
Spoke with patient, she states that she has been struggling with constipation. Pt states that she drank a 10 oz bottle of magnesium citrate and had a small BM, pt states that her last BM was earlier today. Pt states that she has taken Miralax for years. Advised to do a bowel purge which is 7 capfuls of Miralax in 32 oz of Gatorade of her choice and drink 1 cup every 15 minutes until all gone, advised to give this a day or two to work in and if no relief to give [place] a call back. Pt is scheduled for a follow up on 03/27/20 at 9:10 am with Dr. Ardis Hughs.

## 2020-01-25 DIAGNOSIS — H7203 Central perforation of tympanic membrane, bilateral: Secondary | ICD-10-CM | POA: Diagnosis not present

## 2020-01-25 DIAGNOSIS — H903 Sensorineural hearing loss, bilateral: Secondary | ICD-10-CM | POA: Diagnosis not present

## 2020-01-27 ENCOUNTER — Telehealth: Payer: Self-pay | Admitting: Pharmacist

## 2020-01-27 NOTE — Telephone Encounter (Signed)
Patient also states that [facility] never received - rx for nicotine patches -pr

## 2020-01-27 NOTE — Telephone Encounter (Signed)
Spoke with patient. She is going to call 800-QUIT-NOW for patient assistance.

## 2020-01-27 NOTE — Telephone Encounter (Signed)
Contacted patient via telephone today. Patient understands nicotine inhaler is not covered by insurance. Patient states nicotine patch is too expensive. Patient cannot have Chantix or bupropion due to seizure history. Advised patient to contact 1-800-QUIT-NOW to obtain resources for patient assistance for nicotine replacement.

## 2020-01-27 NOTE — Telephone Encounter (Signed)
Patient called back - states that patches is not covered because it is OTC - pt wants to try something is covered by insurance- pt can be reached [phone]

## 2020-01-31 ENCOUNTER — Telehealth: Payer: Self-pay | Admitting: Gastroenterology

## 2020-02-01 DIAGNOSIS — N3941 Urge incontinence: Secondary | ICD-10-CM | POA: Diagnosis not present

## 2020-02-01 DIAGNOSIS — R350 Frequency of micturition: Secondary | ICD-10-CM | POA: Diagnosis not present

## 2020-02-01 NOTE — Telephone Encounter (Signed)
The pt states she continues to have constipation despite doing miralax purge.  She states she has had some loose stools.  She has not tried mag citrate and will try that and keep appt as planned.  She states she has some abd discomfort and if this worsens she will go to Urgent care for eval.

## 2020-02-01 NOTE — Telephone Encounter (Signed)
Left message on machine to call back  

## 2020-02-04 ENCOUNTER — Other Ambulatory Visit: Payer: Self-pay | Admitting: *Deleted

## 2020-02-04 MED ORDER — ARNUITY ELLIPTA 100 MCG/ACT IN AEPB: 1.0000 | INHALATION_SPRAY | Freq: Every day | RESPIRATORY_TRACT | 11 refills | Status: DC

## 2020-02-10 ENCOUNTER — Telehealth: Payer: Self-pay | Admitting: Gastroenterology

## 2020-02-10 NOTE — Telephone Encounter (Signed)
The message is from august 30.  I have already spoken with the pt and nothing further needed.

## 2020-02-10 NOTE — Telephone Encounter (Signed)
Pt called stating that she was returning your call. She stated that she heard her voice msgs today and there was a msg from you but pt is not sure of when the msg is from. Pls call her.

## 2020-02-13 DIAGNOSIS — Z20822 Contact with and (suspected) exposure to covid-19: Secondary | ICD-10-CM | POA: Diagnosis not present

## 2020-02-13 DIAGNOSIS — Z03818 Encounter for observation for suspected exposure to other biological agents ruled out: Secondary | ICD-10-CM | POA: Diagnosis not present

## 2020-02-18 DIAGNOSIS — N952 Postmenopausal atrophic vaginitis: Secondary | ICD-10-CM | POA: Diagnosis not present

## 2020-02-18 DIAGNOSIS — R300 Dysuria: Secondary | ICD-10-CM | POA: Diagnosis not present

## 2020-02-18 DIAGNOSIS — N3020 Other chronic cystitis without hematuria: Secondary | ICD-10-CM | POA: Diagnosis not present

## 2020-02-22 ENCOUNTER — Other Ambulatory Visit: Payer: Self-pay | Admitting: Pulmonary Disease

## 2020-02-22 DIAGNOSIS — J439 Emphysema, unspecified: Secondary | ICD-10-CM

## 2020-02-25 ENCOUNTER — Telehealth: Payer: Self-pay | Admitting: Cardiology

## 2020-02-25 NOTE — Telephone Encounter (Signed)
New message:    Patient is calling to see if 20 mg nicotine patches is ok for her to use.

## 2020-02-25 NOTE — Telephone Encounter (Signed)
Sorry, I misunderstood.    Pravastatin can cause skin rashes in some patients, however she has been taking it for many years and it looks like her recent dose increase was prescribed in early August so this is likely not the cause.  Recommend checking with PCP or dermatology

## 2020-02-25 NOTE — Telephone Encounter (Signed)
Message sent to patient via MyChart. 

## 2020-02-25 NOTE — Telephone Encounter (Signed)
Are the discoloration areas where she is applying the patches?  Recommend alternating sites daily where she is applying the patches

## 2020-02-25 NOTE — Telephone Encounter (Signed)
Spoke with patient of Dr. [name] who has been referred by pulmonary to a quit smoking program. She is asking if nicotine patches 21mg  are safe to take. Advised already prescribed, OK to take with current medications, and smoking cessation is advised in general but especially for her cardiovascular conditions. She is asking about SE - worried about worsening palpitations. Advised that if she experiencing any worsening symptoms, to contact office.   She is also inquiring about discolored patches that come up on her skin, appearing first as pimple-like and then pop. She is asking if pravastatin dose increase could have caused this - advised not likely but will send message to [facility] PharmD team to advise if any other medications could cause this.   Also scheduled patient for Dec OV - due around Jan 2022 but she scheduled earlier and plans to get fasting labs (lipid, LFT) prior

## 2020-02-25 NOTE — Telephone Encounter (Signed)
Patient has not yet started patches.

## 2020-03-03 ENCOUNTER — Telehealth: Payer: Self-pay | Admitting: Gastroenterology

## 2020-03-03 NOTE — Telephone Encounter (Signed)
Patient is requesting to speak with you again

## 2020-03-03 NOTE — Telephone Encounter (Signed)
The pt wanted to let me know that she called the pharmacy and they have a record of the diflucan being sent on 10/2018.

## 2020-03-03 NOTE — Telephone Encounter (Signed)
The pt believes she had a EGD sooner than the documented 10/2018.  I tried to explain that she had not been seen for procedure since that appt.  She already has an appt for 10/28 to discuss constipation.  She states she never gets to see Dr Ardis Hughs and wanted me to say that Dr Ardis Hughs was going to actually walk in the room to see her. I confirmed that the app is with Dr Ardis Hughs and she will see him at that time. The pt has been advised of the information and verbalized understanding.

## 2020-03-17 ENCOUNTER — Telehealth: Payer: Self-pay | Admitting: Pulmonary Disease

## 2020-03-17 NOTE — Telephone Encounter (Signed)
Called pt but unable to reach. Left message for her to return call.

## 2020-03-18 IMAGING — DX DG CHEST 2V
2 series · 2 of 2 positions shown · non-contrast
Comparison: CTA chest 08/29/2017, earlier.

CLINICAL DATA: 61-year-old female with shortness of breath and
chest pain for 2 months.

EXAM:
CHEST - 2 VIEW

[chest pa]
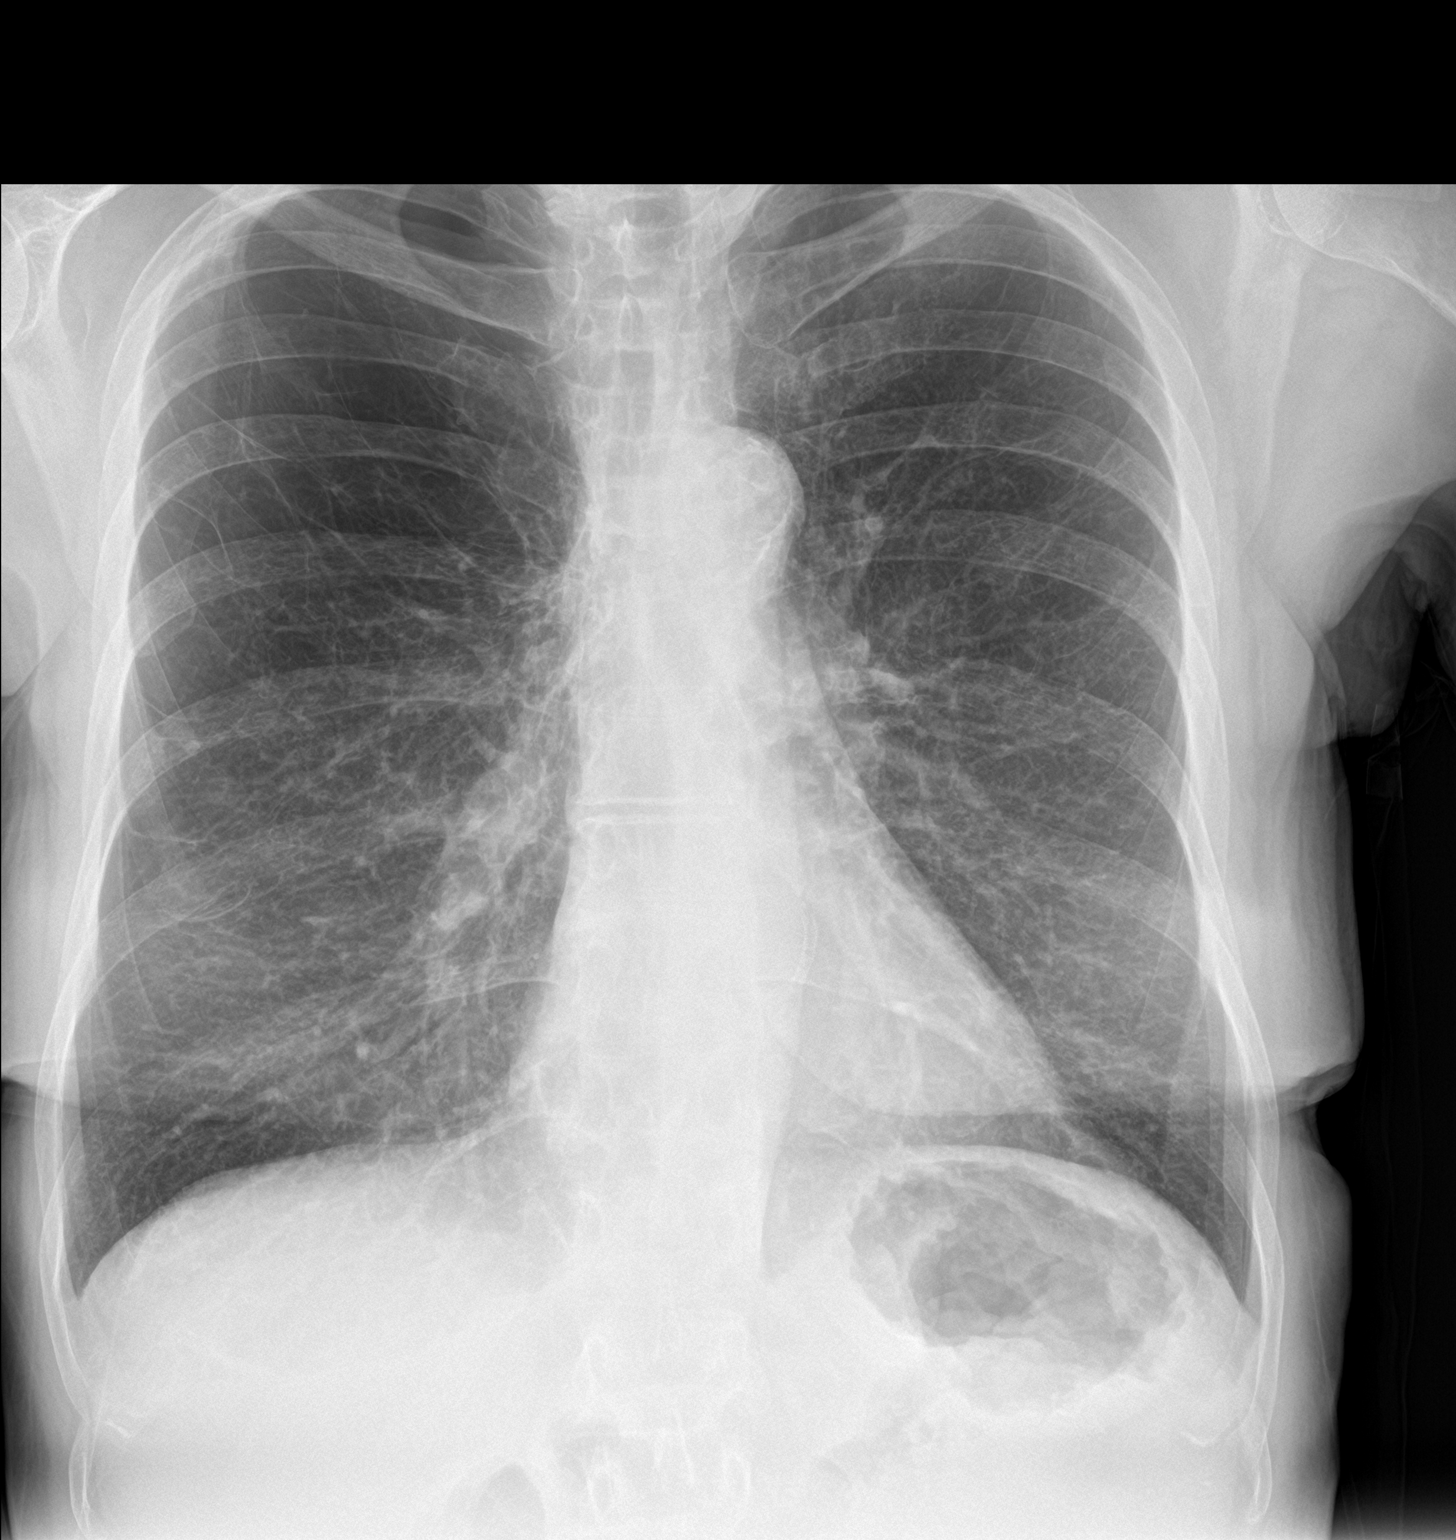

[chest lat]
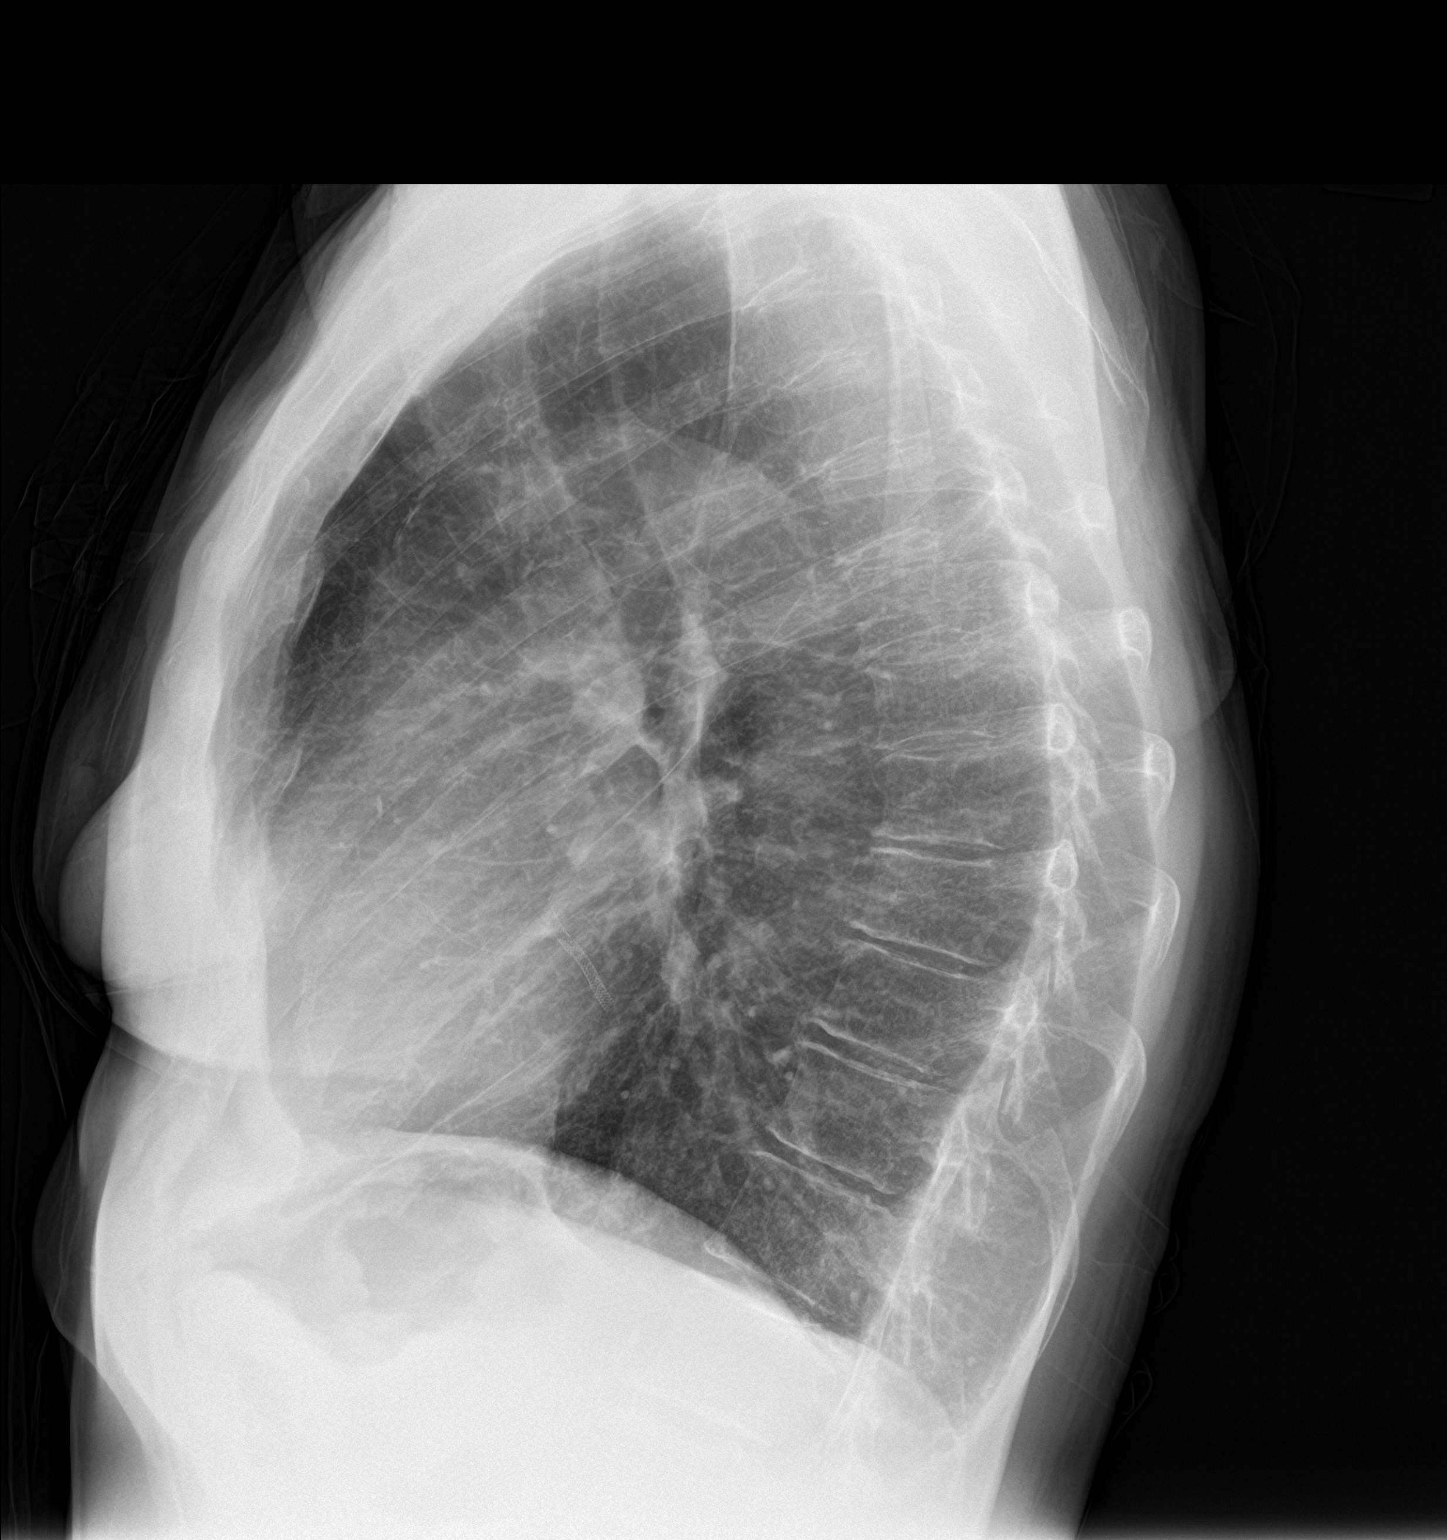

[2 of 2 positions shown; findings below may reference images not displayed]

FINDINGS: Stable large lung volumes. Mediastinal contours remain normal.
Coronary artery atherosclerosis and/or stent redemonstrated.
Calcified aortic atherosclerosis. Visualized tracheal air column is
within normal limits.

Upper lobe bullous emphysema greater on the right. A tiny right
midlung nodule in the periphery is stable. No pneumothorax,
pulmonary edema, pleural effusion or acute pulmonary opacity. No
acute osseous abnormality identified. Negative visible bowel gas
pattern.
IMPRESSION: 1.  No acute cardiopulmonary abnormality.
2. Aortic Atherosclerosis (AMJQ0-D6H.H) and Emphysema ([id number].T).

## 2020-03-20 DIAGNOSIS — J449 Chronic obstructive pulmonary disease, unspecified: Secondary | ICD-10-CM | POA: Diagnosis not present

## 2020-03-20 DIAGNOSIS — E7800 Pure hypercholesterolemia, unspecified: Secondary | ICD-10-CM | POA: Diagnosis not present

## 2020-03-20 DIAGNOSIS — K5909 Other constipation: Secondary | ICD-10-CM | POA: Diagnosis not present

## 2020-03-20 DIAGNOSIS — Z0000 Encounter for general adult medical examination without abnormal findings: Secondary | ICD-10-CM | POA: Diagnosis not present

## 2020-03-20 DIAGNOSIS — J40 Bronchitis, not specified as acute or chronic: Secondary | ICD-10-CM | POA: Diagnosis not present

## 2020-03-20 DIAGNOSIS — F17200 Nicotine dependence, unspecified, uncomplicated: Secondary | ICD-10-CM | POA: Diagnosis not present

## 2020-03-20 DIAGNOSIS — R7303 Prediabetes: Secondary | ICD-10-CM | POA: Diagnosis not present

## 2020-03-20 DIAGNOSIS — Z23 Encounter for immunization: Secondary | ICD-10-CM | POA: Diagnosis not present

## 2020-03-20 DIAGNOSIS — L659 Nonscarring hair loss, unspecified: Secondary | ICD-10-CM | POA: Diagnosis not present

## 2020-03-23 ENCOUNTER — Telehealth: Payer: Self-pay | Admitting: Pulmonary Disease

## 2020-03-23 NOTE — Telephone Encounter (Signed)
Primary Pulmonologist: Sood  Last office visit and with whom: Geraldo Pitter NP 11/29/19  What do we see them for (pulmonary problems): COPD Last OV assessment/plan:  Assessment & Plan Note by Martyn Ehrich, NP at 11/29/2019 11:40 AM Author: Martyn Ehrich, NP Author Type: Nurse Practitioner Filed: 11/29/2019 1:11 PM  Note Status: [place] List: Cosign Not Required Encounter Date: 11/29/2019  Problem: COPD (chronic obstructive pulmonary disease) [facility])  Editor: Martyn Ehrich, NP (Nurse Practitioner)      Prior Versions: 1. Martyn Ehrich, NP (Nurse Practitioner) at 11/29/2019 1:10 PM - Edited   2. Martyn Ehrich, NP (Nurse Practitioner) at 11/29/2019 1:09 PM - Edited   3. Martyn Ehrich, NP (Nurse Practitioner) at 11/29/2019 11:41 AM - Written      - No signs of acute exacerbation on exam - CXR 11/29/2019 showed changes of COPD with chronic interstitial prominence. No new consolidation or edema - Continue Arnuity and Spiriva once daily  - Intolerant to LABA and albuterol d/t palpitations - No indication for abx or prednisone today - Strongly encourage smoking cessation, she would like to quit and is agreeing to see pharmacist for addition counseling     Assessment & Plan Note by Martyn Ehrich, NP at 11/29/2019 11:41 AM Author: Martyn Ehrich, NP Author Type: Nurse Practitioner Filed: 11/29/2019 11:42 AM  Note Status: Written Cosign: Cosign Not Required Encounter Date: 11/29/2019  Problem: Bronchiectasis without complication ([place])  Editor: Martyn Ehrich, NP (Nurse Practitioner)                 - Recommend patient take Mucinex 600mg  1-2 tablets twice daily  - Needs new flutter valve, encouraged patient use twice daily     Assessment & Plan Note by Martyn Ehrich, NP at 11/29/2019 11:40 AM Author: Martyn Ehrich, NP Author Type: Nurse Practitioner Filed: 11/29/2019 11:40 AM  Note Status: Written Cosign: Cosign Not Required Encounter Date:  11/29/2019  Problem: Pleuritic pain  Editor: Martyn Ehrich, NP (Nurse Practitioner)                 - Left side back pain x 3 weeks - Checking CXR and D-DIMER     Patient Instructions by Martyn Ehrich, NP at 11/29/2019 11:00 AM Author: Martyn Ehrich, NP Author Type: Nurse Practitioner Filed: 11/29/2019 11:34 AM  Note Status: Addendum Cosign: Cosign Not Required Encounter Date: 11/29/2019  Editor: Martyn Ehrich, NP (Nurse Practitioner)      Prior Versions: 1. Martyn Ehrich, NP (Nurse Practitioner) at 11/29/2019 11:31 AM - Addendum   2. Martyn Ehrich, NP (Nurse Practitioner) at 11/29/2019 11:26 AM - Addendum   3. Martyn Ehrich, NP (Nurse Practitioner) at 11/29/2019 11:25 AM - Signed      Right ear impaction (ear wax):  Recommend you get over the counter Debrox drops - use this for 3 days to right ear and call PCP to make an apt to have right ear cleaned out  COPD: Continue Arnuity and Spiriva ONCE daily Bronchiectasis: Take mucinex 600mg  1-2 tabs twice a day for one week; then take as needed Use flutter valve twice a day   Orders: CXR today  Flutter valve   Referral: Please make an apt with pharmacy for smoking cessation  Follow-up: Due for regular follow up in November 2021 (recall may already be in with Dr. Halford Chessman)     Instructions  Right ear impaction (ear wax):  Recommend you get over  the counter Debrox drops - use this for 3 days to right ear and call PCP to make an apt to have right ear cleaned out  COPD: Continue Arnuity and Spiriva ONCE daily Bronchiectasis: Take mucinex 600mg  1-2 tabs twice a day for one week; then take as needed Use flutter valve twice a day   Orders: CXR today  Flutter valve   Referral: Please make an apt with pharmacy for smoking cessation  Follow-up: Due for regular follow up in November 2021 (recall may already be in with Dr. Halford Chessman)       Was appointment offered to patient (explain)?  Yes,  03/24/20   Reason for call: coughing up a lot of mucus, clear to yellow.  Saw her pcp on 03/20/20, put on an antibiotic.  Taking musinex.  Denies any increase in sob, fever, chills, body aches.  Oxygen sats 55%-96%.  Has had flu, pna and covid vaccines.  Using all medications except for nebulizer meds.   Said pcp did not hear any crackling in her lungs.  Has pain in her lungs, happens at any time, rates at a 7.  Has most of the time.  Taking Tylenol, does not help.  Treating for bronchitis.   Had CT scan showing more scaring at the bottom of lungs.  Scheduled patient for an earlier appointment, 10/22 with [name] as it is late in the day and she saw her pcp on 03/20/20 and is not feeling better.  (examples of things to ask: : When did symptoms start? Fever? Cough? Productive? Color to sputum? More sputum than usual? Wheezing? Have you needed increased oxygen? Are you taking your respiratory medications? What over the counter measures have you tried?)  Allergies  Allergen Reactions  . Ciprofloxacin Other (See Comments)    Hypoglycemia   . Aspirin Other (See Comments)    GI upset; patient is only able to take enteric coated Aspirin.    . Crestor [Rosuvastatin] Other (See Comments)    Muscle pain   . Ibuprofen Other (See Comments)    GI upset   . Wellbutrin [Bupropion] Palpitations  . Zoloft [Sertraline Hcl] Other (See Comments)    Insomnia, fatigue   . Albuterol Palpitations  . Lipitor [Atorvastatin] Other (See Comments)    Muscle pain   . Sulfonamide Derivatives Itching and Rash    Immunization History  Administered Date(s) Administered  . Influenza Split 03/03/2012, 02/19/2017  . Influenza Whole 03/21/2006, 03/06/2007, 04/01/2010  . Influenza,inj,Quad PF,6+ Mos 03/03/2014, 03/04/2016, 02/08/2017, 03/10/2018  . Influenza-Unspecified 02/07/2017  . PFIZER SARS-COV-2 Vaccination 08/21/2019, 09/18/2019  . Td 06/03/2008

## 2020-03-23 NOTE — Telephone Encounter (Signed)
Called and spoke to pt. Pt requesting cough medicine. Pt last seen in 11/2019. Appt scheduled with Wyn Quaker, NP. Dr. Halford Chessman first available was 12/3. Pt verbalized understanding and denied any further questions or concerns at this time.

## 2020-03-24 ENCOUNTER — Encounter: Payer: Self-pay | Admitting: Pulmonary Disease

## 2020-03-24 ENCOUNTER — Other Ambulatory Visit: Payer: Self-pay

## 2020-03-24 ENCOUNTER — Ambulatory Visit (INDEPENDENT_AMBULATORY_CARE_PROVIDER_SITE_OTHER): Payer: Medicare HMO | Admitting: Pulmonary Disease

## 2020-03-24 ENCOUNTER — Ambulatory Visit (INDEPENDENT_AMBULATORY_CARE_PROVIDER_SITE_OTHER): Payer: Medicare HMO

## 2020-03-24 VITALS — BP 118/74 | HR 87 | Temp 97.3°F | Ht 65.0 in | Wt 133.0 lb

## 2020-03-24 DIAGNOSIS — J439 Emphysema, unspecified: Secondary | ICD-10-CM | POA: Diagnosis not present

## 2020-03-24 DIAGNOSIS — F17210 Nicotine dependence, cigarettes, uncomplicated: Secondary | ICD-10-CM

## 2020-03-24 DIAGNOSIS — R0602 Shortness of breath: Secondary | ICD-10-CM | POA: Diagnosis not present

## 2020-03-24 DIAGNOSIS — J471 Bronchiectasis with (acute) exacerbation: Secondary | ICD-10-CM | POA: Insufficient documentation

## 2020-03-24 DIAGNOSIS — R1310 Dysphagia, unspecified: Secondary | ICD-10-CM

## 2020-03-24 DIAGNOSIS — K219 Gastro-esophageal reflux disease without esophagitis: Secondary | ICD-10-CM | POA: Diagnosis not present

## 2020-03-24 DIAGNOSIS — Z720 Tobacco use: Secondary | ICD-10-CM

## 2020-03-24 NOTE — Telephone Encounter (Signed)
Noted. Thank you.   Michele [name]

## 2020-03-24 NOTE — Assessment & Plan Note (Signed)
Plan: Continue Arnuity Continue Spiriva HandiHaler Emphasized multiple times throughout visit the patient needs to stop smoking

## 2020-03-24 NOTE — Assessment & Plan Note (Signed)
Plan includes Continue follow-up with primary care and gastroenterology Follow swallowing precautions

## 2020-03-24 NOTE — Patient Instructions (Addendum)
You were seen today by Lauraine Rinne, NP  for:   1. Bronchiectasis with acute exacerbation ([place])  Unfortunately I believe you are having a bronchitic exacerbation due to your noncompliance with your flutter valve  I recommend that you continue to take your doxycycline, as emphasized at today's appointment is important to take antibiotics on a full stomach.  Favor foods that are higher in protein and fiber.  Bronchiectasis: This is the medical term which indicates that you have damage, dilated airways making you more susceptible to respiratory infection. Use a flutter valve 10 breaths twice a day or 4 to 5 breaths 4-5 times a day to help clear mucus out Let us know if you have cough with change in mucus color or fevers or chills.  At that point you would need an antibiotic. Maintain a healthy nutritious diet, eating whole foods Take your medications as prescribed   We will start you on hypertonic saline nebs for you to start taking twice daily before using her flutter valve  Take plain Mucinex 600 to 1200 mg daily Take with full glass of water  Hydrate well   2. SOB (shortness of breath) 3. Pulmonary emphysema, unspecified emphysema type ([place])  Chest x-ray today not showing any acute changes  Continue Arnuity  Continue Spiriva HandiHaler  Note your daily symptoms > remember "red flags" for COPD:   >>>Increase in cough >>>increase in sputum production >>>increase in shortness of breath or activity  intolerance.   If you notice these symptoms, please call the office to be seen.    4. Gastroesophageal reflux disease, unspecified whether esophagitis present 5. Dysphagia, unspecified type  Contact your gastroenterologist immediately to notify them that you are having daily persistent reflux  Schedule a follow-up  6. Tobacco abuse  As emphasized at today's appointment it is important that you stop smoking  Continuing to smoke at the rate and amount that you are smoking will  make it extremely difficult for [place] to manage her breathing appropriately as well as to obtain control of your acid reflux  We recommend that you stop smoking.  >>>You need to set a quit date >>>If you have friends or family who smoke, let them know you are trying to quit and not to smoke around you or in your living environment  Smoking Cessation Resources:  1 800 QUIT NOW  >>> Patient to call this resource and utilize it to help support her quit smoking >>> Keep up your hard work with stopping smoking  You can also contact the [facility] >>>For smoking cessation classes call [phone]  We do not recommend using e-cigarettes as a form of stopping smoking  You can sign up for smoking cessation support texts and information:  >>>https://smokefree.gov/smokefreetxt    Repeat CT scan in April/2022 as managed by oncology   We recommend today:  Orders Placed This Encounter  Procedures  . DG Chest 2 View    Standing Status:   Future    Number of Occurrences:   1    Standing Expiration Date:   03/24/2021    Order Specific Question:   Reason for Exam (SYMPTOM  OR DIAGNOSIS REQUIRED)    Answer:   SOB    Order Specific Question:   Preferred imaging location?    Answer:   Internal  . Ambulatory Referral for DME    Referral Priority:   Routine    Referral Type:   Durable Medical Equipment Purchase    Number of  Visits Requested:   1   Orders Placed This Encounter  Procedures  . DG Chest 2 View  . Ambulatory Referral for DME   No orders of the defined types were placed in this encounter.   Follow Up:    Return in about 6 weeks (around 05/05/2020), or if symptoms worsen or fail to improve, for Follow up with Dr. Halford Chessman.   Notification of test results are managed in the following manner: If there are  any recommendations or changes to the  plan of care discussed in office today,  we will contact you and let you know what they are. If you do not hear from [place], then  your results are normal and you can view them through your  MyChart account , or a letter will be sent to you. Thank you again for trusting [place] with your care  - Thank you, [facility]    It is flu season:   >>> Best ways to protect herself from the flu: Receive the yearly flu vaccine, practice good hand hygiene washing with soap and also using hand sanitizer when available, eat a nutritious meals, get adequate rest, hydrate appropriately       Please contact the office if your symptoms worsen or you have concerns that you are not improving.   Thank you for choosing  [facility] for your healthcare, and for allowing [place] to partner with you on your healthcare journey. I am thankful to be able to provide care to you today.   Wyn Quaker FNP-C         Bronchiectasis  Bronchiectasis is a condition in which the airways in the lungs (bronchi) are damaged and widened. The condition makes it hard for the lungs to get rid of mucus, and it causes mucus to gather in the bronchi. This condition often leads to lung infections, which can make the condition worse. What are the causes? You can be born with this condition or you can develop it later in life. Common causes of this condition include:  Cystic fibrosis.  Repeated lung infections, such as pneumonia or tuberculosis.  An object or other blockage in the lungs.  Breathing in fluid, food, or other objects (aspiration).  A problem with the immune system and lung structure that is present at birth (congenital). Sometimes the cause is not known. What are the signs or symptoms? Common symptoms of this condition include:  A daily cough that brings up mucus and lasts for more than 3 weeks.  Lung infections that happen often.  Shortness of breath and wheezing.  Weakness and fatigue. How is this diagnosed? This condition is diagnosed with tests, such as:  Chest X-rays or CT scans. These are done to check for changes  in the lungs.  Breathing tests. These are done to check how well your lungs are working.  A test of a sample of your saliva (sputum culture). This test is done to check for infection.  Blood tests and other tests. These are done to check for related diseases or causes. How is this treated? Treatment for this condition depends on the severity of the illness and its cause. Treatment may include:  Medicines that loosen mucus so it can be coughed up (expectorants).  Medicines that relax the muscles of the bronchi (bronchodilators).  Antibiotic medicines to prevent or treat infection.  Physical therapy to help clear mucus from the lungs. Techniques may include: ? Postural drainage. This is when you sit or lie in certain  positions so that mucus can drain by gravity. ? Chest percussion. This involves tapping the chest or back with a cupped hand. ? Chest vibration. For this therapy, a hand or special equipment vibrates your chest and back.  Surgery to remove the affected part of the lung. This may be done in severe cases. Follow these instructions at home: Medicines  Take over-the-counter and prescription medicines only as told by your health care provider.  If you were prescribed an antibiotic medicine, take it as told by your health care provider. Do not stop taking the antibiotic even if you start to feel better.  Avoid taking sedatives and antihistamines unless your health care provider tells you to take them. These medicines tend to thicken the mucus in the lungs. Managing symptoms  Perform breathing exercises or techniques to clear your lungs as told by your health care provider.  Consider using a cold steam vaporizer or humidifier in your room or home to help loosen secretions.  If you have a cough that gets worse at night, try sleeping in a semi-upright position. General instructions  Get plenty of rest.  Drink enough fluid to keep your urine clear or pale yellow.  Stay  inside when pollution and ozone levels are high.  Stay up to date with vaccinations and immunizations.  Avoid cigarette smoke and other lung irritants.  Do not use any products that contain nicotine or tobacco, such as cigarettes and e-cigarettes. If you need help quitting, ask your health care provider.  Keep all follow-up visits as told by your health care provider. This is important. Contact a health care provider if:  You cough up more sputum than before and the sputum is yellow or green in color.  You have a fever.  You cannot control your cough and are losing sleep. Get help right away if:  You cough up blood.  You have chest pain.  You have increasing shortness of breath.  You have pain that gets worse or is not controlled with medicines.  You have a fever and your symptoms suddenly get worse. Summary  Bronchiectasis is a condition in which the airways in the lungs (bronchi) are damaged and widened. The condition makes it hard for the lungs to get rid of mucus, and it causes mucus to gather in the bronchi.  Treatment usually includes therapy to help clear mucus from the lungs.  Stay up to date with vaccinations and immunizations. This information is not intended to replace advice given to you by your health care provider. Make sure you discuss any questions you have with your health care provider. Document Revised: 05/02/2017 Document Reviewed: 06/24/2016 Elsevier Patient Education  2020 [employer].

## 2020-03-24 NOTE — Addendum Note (Signed)
Addended by: Vanessa Barbara on: 03/24/2020 10:48 AM   Modules accepted: Orders

## 2020-03-24 NOTE — Telephone Encounter (Signed)
Heather,With all due respect given her symptoms (reported hypoxemia, lung pain, known bronchiectasis, recent antibiotic use, symptoms not improving) then we need to have a recorded temperature.  We also should rule out any sort of COVID-19 or other sick contact exposures.  Her denying any fever does not necessarily mean that she is actually checking.I would recommend that we continue to try to reach her for the safety of our patients and staff.Thank you for leaving the voicemail and putting in the chest ray x-ray order.  Please make sure this is stat.Wyn Quaker FNP

## 2020-03-24 NOTE — Telephone Encounter (Signed)
Called and spoke with patient, she verified that during her current illness she has been taking her temperature and it has not been over 98.7.  She states at her pcp office on 03/20/20 she did not have a fever at that time and that it was thought that she had bronchitis.  I had her take her temperature while I was on the phone and she states it was 98.7.  She had covid back in December of 2020.  She had a negative covid test a couple of months ago, could not provide an exact date.  She said since starting the Doxycycline she has had diarrhea with blood in her stool that is bright red a moderate amount in the toilet and on the toiled paper after she wipes.  This has just started today.  She has a GI doctor, but has expressed she is not happy with this physician.  Advised to call and make aware of the blood in her stool.  Advised her to call her pcp if she is not comfortable call her GI office as the pcp office is the one that prescribed the Doxycycline.  Patient stated she had already called them and was awaiting a call back from them.  Patient will arrive early to get a CXR prior to her visit.

## 2020-03-24 NOTE — Telephone Encounter (Signed)
[name], Per my note from yesterday, the patient DENIES any fevers, chills, body aches.  Patient was prescribed Doxycycline 100 mg on 03/20/20 after seeing her PCP.  I called and left patient a VM letting her know to arrive 30 minutes prior to her appointment to allow for time for check in and to get her CXR completed prior to her visit with [name].

## 2020-03-24 NOTE — Telephone Encounter (Signed)
03/24/2020  Patient has been scheduled for an appointment.  She needs to come in sooner to obtain a chest x-ray.  Additional information is also requested.  We need to know what antibiotic she is currently taking.  If she still having fevers?  If so has she been tested for COVID-19.  I can see clearly in the chart that she is vaccinated.  This is information that needs to be provided prior to scheduling an office visit.  Wyn Quaker, FNP

## 2020-03-24 NOTE — Assessment & Plan Note (Addendum)
Suspect patient with current acute bronchiectatic exacerbation 3 weeks of worsening cough and congestion, worsened fatigue Noncompliance with flutter valve use Currently taking doxycycline Chest x-ray today clear  Plan: Resume flutter valve use If symptoms worsen seek sputum culture Finish doxycycline, take as prescribed with food on the stomach
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
## 2020-03-24 NOTE — Assessment & Plan Note (Signed)
Patient reporting daily persistent reflux despite medications  Plan: Courage patient to schedule follow-up with Dr. Ardis Hughs

## 2020-03-24 NOTE — Telephone Encounter (Signed)
Just for clarification, patient's oxygen saturations are 95-96% on RA, not 55-96% as previously documented, this was a typographical error.

## 2020-03-24 NOTE — Assessment & Plan Note (Signed)
Currently smoking 1/2 packs/day Previously had quit for a few months 80-pack-year smoking history Boyfriend who lives with patient also smokes

## 2020-03-27 ENCOUNTER — Encounter: Payer: Self-pay | Admitting: Gastroenterology

## 2020-03-27 ENCOUNTER — Telehealth: Payer: Self-pay | Admitting: Gastroenterology

## 2020-03-27 ENCOUNTER — Ambulatory Visit (INDEPENDENT_AMBULATORY_CARE_PROVIDER_SITE_OTHER): Payer: Medicare HMO | Admitting: Gastroenterology

## 2020-03-27 ENCOUNTER — Other Ambulatory Visit (INDEPENDENT_AMBULATORY_CARE_PROVIDER_SITE_OTHER): Payer: Medicare HMO

## 2020-03-27 VITALS — BP 124/70 | HR 66 | Ht 65.0 in | Wt 133.0 lb

## 2020-03-27 DIAGNOSIS — K5900 Constipation, unspecified: Secondary | ICD-10-CM

## 2020-03-27 LAB — CBC
HCT: 37.3 % (ref 36.0–46.0)
Hemoglobin: 13.2 g/dL (ref 12.0–15.0)
MCHC: 35.6 g/dL (ref 30.0–36.0)
MCV: 92.1 fl (ref 78.0–100.0)
Platelets: 368 10*3/uL (ref 150.0–400.0)
RBC: 3.93 Mil/uL (ref 3.87–5.11)
RDW: 14.1 % (ref 11.5–15.5)
WBC: 6.5 10*3/uL (ref 4.0–10.5)

## 2020-03-27 MED ORDER — LUBIPROSTONE 8 MCG PO CAPS: 8.0000 ug | ORAL_CAPSULE | Freq: Two times a day (BID) | ORAL | 3 refills | Status: DC
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
## 2020-03-27 NOTE — Telephone Encounter (Signed)
Patient's insurance will not cover Amitiza because she has not tried and failed two of the formulary medications.  She will need to try lactulose or movantik as she has already tried Linzess.    Please advise which other medication you would like her to try.  Thank you

## 2020-03-27 NOTE — Patient Instructions (Signed)
If you are age 66 or older, your body mass index should be between 23-30. Your Body mass index is 22.13 kg/m. If this is out of the aforementioned range listed, please consider follow up with your Primary Care Provider.  If you are age 34 or younger, your body mass index should be between 19-25. Your Body mass index is 22.13 kg/m. If this is out of the aformentioned range listed, please consider follow up with your Primary Care Provider.   Your provider has requested that you go to the basement level for lab work before leaving today. Press "B" on the elevator. The lab is located at the first door on the left as you exit the elevator.  Due to recent changes in healthcare laws, you may see the results of your imaging and laboratory studies on MyChart before your provider has had a chance to review them.  We understand that in some cases there may be results that are confusing or concerning to you. Not all laboratory results come back in the same time frame and the provider may be waiting for multiple results in order to interpret others.  Please give [place] 48 hours in order for your provider to thoroughly review all the results before contacting the office for clarification of your results.   We have sent the following medications to your pharmacy for you to pick up at your convenience:  START: amitiza 58mcg take one capsule twice daily.  CONTINUE: Miralax  You are scheduled for follow up on 05-29-20 at 8:30am.  Thank you for entrusting me with your care and choosing [facility].  Dr Ardis Hughs

## 2020-03-27 NOTE — Progress Notes (Signed)
Reviewed and agree with assessment/plan.   [name], MD [facility] Pulmonary/Critical Care 03/27/2020, 8:29 AM Pager:  [phone]

## 2020-03-28 MED ORDER — LACTULOSE 10 GM/15ML PO SOLN: 15.0000 g | Freq: Three times a day (TID) | ORAL | 11 refills | Status: DC

## 2020-03-28 NOTE — Telephone Encounter (Signed)
Called and spoke to pt.  She was very hesitant to try something different and is concerned about side effects.  I asked her to try it for 2 to 3 weeks and let us know how it is working. She agreed. Script sent

## 2020-03-28 NOTE — Telephone Encounter (Signed)
Let her know about her insurance company's rules and lets try lactulose 15gm TID, one month with 11 refills. She needs to call to report on her response in 3-4 weeks.  thanks

## 2020-03-28 NOTE — Addendum Note (Signed)
Addended by: Roetta Sessions on: 03/28/2020 02:28 PM   Modules accepted: Orders

## 2020-04-02 ENCOUNTER — Other Ambulatory Visit: Payer: Self-pay | Admitting: Physician Assistant

## 2020-04-03 ENCOUNTER — Ambulatory Visit: Payer: Medicare HMO | Admitting: Pulmonary Disease

## 2020-04-03 ENCOUNTER — Other Ambulatory Visit: Payer: Self-pay | Admitting: Cardiology

## 2020-04-03 MED ORDER — PRAVASTATIN SODIUM 80 MG PO TABS: 80.0000 mg | ORAL_TABLET | Freq: Every evening | ORAL | 3 refills | Status: DC

## 2020-04-03 NOTE — Telephone Encounter (Signed)
*  STAT* If patient is at the pharmacy, call can be transferred to refill team.   1. Which medications need to be refilled? (please list name of each medication and dose if known) pravastatin 80 mg   2. Which pharmacy/location (including street and city if local pharmacy) is medication to be sent to? [place]  3. Do they need a 30 day or 90 day supply? [place]

## 2020-04-04 ENCOUNTER — Telehealth: Payer: Self-pay | Admitting: Gastroenterology

## 2020-04-04 ENCOUNTER — Other Ambulatory Visit: Payer: Self-pay | Admitting: Radiation Therapy

## 2020-04-04 NOTE — Telephone Encounter (Signed)
The pt has some rectal bleeding this morning and states she has severe abd pain.  She says the blood filled the toilet and dripped all over the floor.  She tells me she is doubled over in pain.  I have advised her to go to the ED or urgent care for her stated 10/10 abd pain.  FYI Dr Ardis Hughs

## 2020-04-13 DIAGNOSIS — D692 Other nonthrombocytopenic purpura: Secondary | ICD-10-CM | POA: Diagnosis not present

## 2020-04-13 DIAGNOSIS — L821 Other seborrheic keratosis: Secondary | ICD-10-CM | POA: Diagnosis not present

## 2020-04-13 DIAGNOSIS — D2371 Other benign neoplasm of skin of right lower limb, including hip: Secondary | ICD-10-CM | POA: Diagnosis not present

## 2020-04-13 DIAGNOSIS — L814 Other melanin hyperpigmentation: Secondary | ICD-10-CM | POA: Diagnosis not present

## 2020-04-13 DIAGNOSIS — L659 Nonscarring hair loss, unspecified: Secondary | ICD-10-CM | POA: Diagnosis not present

## 2020-04-13 DIAGNOSIS — D225 Melanocytic nevi of trunk: Secondary | ICD-10-CM | POA: Diagnosis not present

## 2020-04-13 DIAGNOSIS — L570 Actinic keratosis: Secondary | ICD-10-CM | POA: Diagnosis not present

## 2020-04-13 DIAGNOSIS — L281 Prurigo nodularis: Secondary | ICD-10-CM | POA: Diagnosis not present

## 2020-04-13 DIAGNOSIS — D2372 Other benign neoplasm of skin of left lower limb, including hip: Secondary | ICD-10-CM | POA: Diagnosis not present

## 2020-04-21 ENCOUNTER — Ambulatory Visit (HOSPITAL_COMMUNITY)
Admission: RE | Admit: 2020-04-21 | Discharge: 2020-04-21 | Disposition: A | Discharge status: Home / Self Care | Payer: Medicare HMO | Source: Ambulatory Visit | Attending: Internal Medicine | Admitting: Internal Medicine

## 2020-04-21 ENCOUNTER — Other Ambulatory Visit: Payer: Self-pay

## 2020-04-21 DIAGNOSIS — C3491 Malignant neoplasm of unspecified part of right bronchus or lung: Secondary | ICD-10-CM

## 2020-04-21 MED ORDER — GADOBUTROL 1 MMOL/ML IV SOLN: 6.0000 mL | Freq: Once | INTRAVENOUS | Status: DC | PRN

## 2020-04-21 NOTE — Progress Notes (Signed)
Patient arrived for MRI Brain wo/w, completed Brain wo contrast and was unsuccessful in getting IV access. Patient is going to Return to the cancer center Monday morning at 10:30 for IV placement and then return to MRI for post contrast imaging. Per Dr Posey Pronto, hold images until Monday and then MRI Brain wo/w can be read .

## 2020-04-24 ENCOUNTER — Ambulatory Visit (HOSPITAL_COMMUNITY)
Admission: RE | Admit: 2020-04-24 | Discharge: 2020-04-24 | Disposition: A | Discharge status: Home / Self Care | Payer: Medicare HMO | Source: Ambulatory Visit | Attending: Internal Medicine | Admitting: Internal Medicine

## 2020-04-24 ENCOUNTER — Inpatient Hospital Stay: Payer: Medicare HMO

## 2020-04-24 ENCOUNTER — Other Ambulatory Visit: Payer: Self-pay

## 2020-04-24 ENCOUNTER — Inpatient Hospital Stay: Payer: Medicare HMO | Attending: Internal Medicine | Admitting: Internal Medicine

## 2020-04-24 VITALS — BP 141/56 | HR 66 | Temp 97.0°F | Resp 18 | Ht 65.0 in | Wt 132.4 lb

## 2020-04-24 DIAGNOSIS — C3491 Malignant neoplasm of unspecified part of right bronchus or lung: Secondary | ICD-10-CM | POA: Diagnosis not present

## 2020-04-24 DIAGNOSIS — Z79899 Other long term (current) drug therapy: Secondary | ICD-10-CM | POA: Insufficient documentation

## 2020-04-24 DIAGNOSIS — F17210 Nicotine dependence, cigarettes, uncomplicated: Secondary | ICD-10-CM | POA: Insufficient documentation

## 2020-04-24 DIAGNOSIS — Z85118 Personal history of other malignant neoplasm of bronchus and lung: Secondary | ICD-10-CM | POA: Diagnosis not present

## 2020-04-24 DIAGNOSIS — C3490 Malignant neoplasm of unspecified part of unspecified bronchus or lung: Secondary | ICD-10-CM | POA: Diagnosis not present

## 2020-04-24 DIAGNOSIS — C7931 Secondary malignant neoplasm of brain: Secondary | ICD-10-CM | POA: Insufficient documentation

## 2020-04-24 DIAGNOSIS — R519 Headache, unspecified: Secondary | ICD-10-CM | POA: Insufficient documentation

## 2020-04-24 DIAGNOSIS — G44219 Episodic tension-type headache, not intractable: Secondary | ICD-10-CM

## 2020-04-24 DIAGNOSIS — H748X2 Other specified disorders of left middle ear and mastoid: Secondary | ICD-10-CM | POA: Diagnosis not present

## 2020-04-24 DIAGNOSIS — G629 Polyneuropathy, unspecified: Secondary | ICD-10-CM | POA: Insufficient documentation

## 2020-04-24 DIAGNOSIS — I611 Nontraumatic intracerebral hemorrhage in hemisphere, cortical: Secondary | ICD-10-CM | POA: Diagnosis not present

## 2020-04-24 MED ORDER — GADOBUTROL 1 MMOL/ML IV SOLN
6.0000 mL | Freq: Once | INTRAVENOUS | Status: AC | PRN
  Administered 2020-04-24: 6 mL via INTRAVENOUS

## 2020-04-24 NOTE — Progress Notes (Signed)
Alerted Michele [name] in MRI that pt has working PIV 22G for scan today.
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
## 2020-05-02 ENCOUNTER — Telehealth: Payer: Self-pay | Admitting: Pulmonary Disease

## 2020-05-02 DIAGNOSIS — L244 Irritant contact dermatitis due to drugs in contact with skin: Secondary | ICD-10-CM | POA: Diagnosis not present

## 2020-05-02 DIAGNOSIS — S80812A Abrasion, left lower leg, initial encounter: Secondary | ICD-10-CM | POA: Diagnosis not present

## 2020-05-04 NOTE — Telephone Encounter (Signed)
Pt has an appt with Dr. Halford Chessman on 12.3.2021. Will keep encounter open to assure pt keeps appt.

## 2020-05-05 ENCOUNTER — Ambulatory Visit: Payer: Medicare HMO | Admitting: Pulmonary Disease

## 2020-05-08 ENCOUNTER — Ambulatory Visit (INDEPENDENT_AMBULATORY_CARE_PROVIDER_SITE_OTHER): Payer: Medicare HMO | Admitting: Pulmonary Disease

## 2020-05-08 ENCOUNTER — Other Ambulatory Visit: Payer: Self-pay

## 2020-05-08 ENCOUNTER — Encounter: Payer: Self-pay | Admitting: Pulmonary Disease

## 2020-05-08 VITALS — BP 138/80 | HR 82 | Temp 97.1°F | Ht 65.5 in | Wt 133.6 lb

## 2020-05-08 DIAGNOSIS — K219 Gastro-esophageal reflux disease without esophagitis: Secondary | ICD-10-CM | POA: Diagnosis not present

## 2020-05-08 DIAGNOSIS — F17210 Nicotine dependence, cigarettes, uncomplicated: Secondary | ICD-10-CM

## 2020-05-08 DIAGNOSIS — W19XXXA Unspecified fall, initial encounter: Secondary | ICD-10-CM | POA: Insufficient documentation

## 2020-05-08 DIAGNOSIS — E785 Hyperlipidemia, unspecified: Secondary | ICD-10-CM | POA: Diagnosis not present

## 2020-05-08 DIAGNOSIS — Z9861 Coronary angioplasty status: Secondary | ICD-10-CM | POA: Diagnosis not present

## 2020-05-08 DIAGNOSIS — R1310 Dysphagia, unspecified: Secondary | ICD-10-CM

## 2020-05-08 DIAGNOSIS — Z720 Tobacco use: Secondary | ICD-10-CM

## 2020-05-08 DIAGNOSIS — F32A Depression, unspecified: Secondary | ICD-10-CM

## 2020-05-08 DIAGNOSIS — J479 Bronchiectasis, uncomplicated: Secondary | ICD-10-CM

## 2020-05-08 DIAGNOSIS — J439 Emphysema, unspecified: Secondary | ICD-10-CM | POA: Diagnosis not present

## 2020-05-08 DIAGNOSIS — W19XXXD Unspecified fall, subsequent encounter: Secondary | ICD-10-CM

## 2020-05-08 DIAGNOSIS — I2510 Atherosclerotic heart disease of native coronary artery without angina pectoris: Secondary | ICD-10-CM | POA: Diagnosis not present

## 2020-05-08 MED ORDER — NICOTINE POLACRILEX 4 MG MT LOZG: 4.0000 mg | LOZENGE | OROMUCOSAL | 3 refills | Status: DC | PRN

## 2020-05-08 MED ORDER — NICOTINE 21 MG/24HR TD PT24: 21.0000 mg | MEDICATED_PATCH | Freq: Every day | TRANSDERMAL | 3 refills | Status: DC

## 2020-05-08 NOTE — Assessment & Plan Note (Signed)
Plan: Encourage patient to seek follow-up with primary care or orthopedics regarding her fall
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
## 2020-05-08 NOTE — Assessment & Plan Note (Signed)
Plan: Continue to follow-up with primary care and gastroenterology Continue swallow precautions

## 2020-05-08 NOTE — Assessment & Plan Note (Signed)
Reviewed at great detail patient's bronchiectasis as well as nonadherence with flutter valve use  Plan: Encourage patient to resume flutter valve use Encourage patient to obtain COVID-19 booster vaccination

## 2020-05-08 NOTE — Assessment & Plan Note (Signed)
Plan: Continue follow-up with Dr. Ardis Hughs

## 2020-05-08 NOTE — Assessment & Plan Note (Signed)
Plan: Encourage patient to contact primary care and schedule an immediate follow-up for evaluation of her depression Agree with previously stated recommendations from cardiology that patient needs to be on a long-term SSRI or SNRI to treat anxiety and depression. Patient denies suicidal or homicidal thoughts, Reviewed plan of care if patient does develop thoughts of suicide or homicide

## 2020-05-08 NOTE — Assessment & Plan Note (Signed)
Currently smoking 1.5 packs/day 80-pack-year smoking history Boyfriend who lives with patient also smokes Patient has been waiting for nicotine replacement therapies from primary care and cardiology.  Patient reports that she is seeking "approval" from those providers.  Discussion: Discussed at great lengths greater than 15 minutes in the office visit today that Michele [name] needs to stop smoking.  She needs to start nicotine replacement therapies and work on decreasing her cigarette consumption immediately.  We will prescribe her nicotine replacement therapies today.  Plan: Start 21 mg nicotine patch Start 4 mg nicotine lozenge Continue follow-up with primary care Goal to be down to 1 pack/day at least by next follow-up Reviewed with patient's boyfriend who she lives with that he also needs to decrease his smoking consumption or smoke outside for the betterment of the patient

## 2020-05-08 NOTE — Assessment & Plan Note (Addendum)
Plan: Continue Arnuity Continue Spiriva HandiHaler Emphasized in great detail her need to stop smoking Nicotine replacement therapies prescribed today

## 2020-05-08 NOTE — Patient Instructions (Addendum)
You were seen today by Lauraine Rinne, NP  for:   1. Pulmonary emphysema, unspecified emphysema type ([place])  Continue Arnuity  Continue Spiriva HandiHaler  Note your daily symptoms > remember "red flags" for COPD:   >>>Increase in cough >>>increase in sputum production >>>increase in shortness of breath or activity  intolerance.   If you notice these symptoms, please call the office to be seen.   2. Tobacco abuse  - nicotine (NICODERM CQ) 21 mg/24hr patch; Place 1 patch (21 mg total) onto the skin daily.  Dispense: 28 patch; Refill: 3 - nicotine polacrilex (COMMIT) 4 MG lozenge; Take 1 lozenge (4 mg total) by mouth as needed for smoking cessation.  Dispense: 108 tablet; Refill: 3  Nicotine patches: >>>Make sure you rotate sites that you do not get skin irritation, Apply 1 patch each morning to a non-hairy skin site  If you are smoking greater than 10 cigarettes/day and weigh over 45 kg start with the nicotine patch of 21 mg a day for 6 weeks, then 14 mg a day for 2 weeks, then finished with 7 mg a day for 2 weeks, then stop  If you are smoking less than 10 cigarettes a day or weight less than 45 kg start with medium dose pack of 14 mg a day for 6 weeks, followed by 7 mg a day for 2 weeks   >>>If insomnia occurs you are having trouble sleeping you can take the patch off at night, and place a new one on in the morning >>>If the patch is removed at night and you have morning cravings start short acting nicotine replacement therapy such as gum or lozenges  >>>Avoid acidic beverages such as coffee, carbonated beverages before and during gum use.  A soft acidic beverages lower oral pH which cause nicotine to not be absorbed properly >>>If you chew the gum too quickly or vigorously you could have nausea, vomiting, abdominal pain, constipation, hiccups, headache, sore jaw, mouth irritation ulcers  Nicotine lozenge: Lozenges are commonly uses short acting NRT product  >>>Smokers who smoke  within 30 minutes of awakening should use 4 mg dose >>>Smokers who wait more than 30 minutes after awakening to smoke should use 2 mg dose  Can use up to 1 lozenge every 1-2 hours for 6 weeks >>>Total amount of lozenges that can be used per day as 20 >>>Gradually reduce number of lozenges used per day after 2 weeks of use  Place lozenge in mouth and allowed to dissolve for 30 minutes loss and does not need to be chewed  Lozenges have advantages to be able to be used in people with TMG, poor dentition, dentures   3. Bronchiectasis without complication (HCC)  Bronchiectasis: This is the medical term which indicates that you have damage, dilated airways making you more susceptible to respiratory infection. Use a flutter valve 10 breaths twice a day or 4 to 5 breaths 4-5 times a day to help clear mucus out Let us know if you have cough with change in mucus color or fevers or chills.  At that point you would need an antibiotic. Maintain a healthy nutritious diet, eating whole foods Take your medications as prescribed   Take plain Mucinex 600 to 1200 mg daily Take with full glass of water  Hydrate well  4. Dysphagia, unspecified type 5. Gastroesophageal reflux disease, unspecified whether esophagitis present  Continue follow-up with gastroenterology  Please contact gastroenterology regarding your concerns of swallowing as well as abdominal discomfort  6.  Depression, unspecified depression type  As discussed today at length please schedule an appointment with primary care regarding your depression  As also discussed if you do become suicidal or homicidal please contact 911  7. Fall, subsequent encounter  I'm sorry that you had a recent fall.  I'm also sorry that you're having ongoing pain from this.  As discussed today would recommend that you follow-up with your orthopedic provider or primary care for further evaluation   We recommend today:   Meds ordered this encounter   Medications  . nicotine (NICODERM CQ) 21 mg/24hr patch    Sig: Place 1 patch (21 mg total) onto the skin daily.    Dispense:  28 patch    Refill:  3  . nicotine polacrilex (COMMIT) 4 MG lozenge    Sig: Take 1 lozenge (4 mg total) by mouth as needed for smoking cessation.    Dispense:  108 tablet    Refill:  3    Follow Up:    Return in about 3 months (around 08/06/2020), or if symptoms worsen or fail to improve, for Follow up with Dr. Halford Chessman.   Notification of test results are managed in the following manner: If there are  any recommendations or changes to the  plan of care discussed in office today,  we will contact you and let you know what they are. If you do not hear from [place], then your results are normal and you can view them through your  MyChart account , or a letter will be sent to you. Thank you again for trusting [place] with your care  - Thank you, [facility]    It is flu season:   >>> Best ways to protect herself from the flu: Receive the yearly flu vaccine, practice good hand hygiene washing with soap and also using hand sanitizer when available, eat a nutritious meals, get adequate rest, hydrate appropriately       Please contact the office if your symptoms worsen or you have concerns that you are not improving.   Thank you for choosing [facility] for your healthcare, and for allowing [place] to partner with you on your healthcare journey. I am thankful to be able to provide care to you today.   Wyn Quaker FNP-C

## 2020-05-08 NOTE — Progress Notes (Signed)
Reviewed and agree with assessment/plan.   [name], MD [facility] 05/08/2020, 12:56 PM Pager:  [phone]

## 2020-05-09 DIAGNOSIS — M5136 Other intervertebral disc degeneration, lumbar region: Secondary | ICD-10-CM | POA: Diagnosis not present

## 2020-05-09 LAB — HEPATIC FUNCTION PANEL
ALT: 12 IU/L (ref 0–32)
AST: 16 IU/L (ref 0–40)
Albumin: 4.7 g/dL (ref 3.8–4.8)
Alkaline Phosphatase: 71 IU/L (ref 44–121)
Bilirubin Total: 0.3 mg/dL (ref 0.0–1.2)
Bilirubin, Direct: 0.1 mg/dL (ref 0.00–0.40)
Total Protein: 6.9 g/dL (ref 6.0–8.5)

## 2020-05-09 LAB — LIPID PANEL
Chol/HDL Ratio: 3.3 ratio (ref 0.0–4.4)
Cholesterol, Total: 163 mg/dL (ref 100–199)
HDL: 50 mg/dL (ref >39)
LDL Chol Calc (NIH): 92 mg/dL (ref 0–99)
Triglycerides: 114 mg/dL (ref 0–149)
VLDL Cholesterol Cal: 21 mg/dL (ref 5–40)

## 2020-05-09 NOTE — Telephone Encounter (Signed)
Pt seen on 12.6.21 by Wyn Quaker, NP. Will close encounter.

## 2020-05-17 ENCOUNTER — Ambulatory Visit (INDEPENDENT_AMBULATORY_CARE_PROVIDER_SITE_OTHER): Payer: Medicare HMO | Admitting: Cardiology

## 2020-05-17 ENCOUNTER — Ambulatory Visit: Payer: Medicare HMO | Admitting: Pulmonary Disease

## 2020-05-17 ENCOUNTER — Other Ambulatory Visit: Payer: Self-pay

## 2020-05-17 ENCOUNTER — Encounter: Payer: Self-pay | Admitting: Cardiology

## 2020-05-17 VITALS — BP 138/72 | HR 91 | Ht 65.0 in | Wt 136.0 lb

## 2020-05-17 DIAGNOSIS — E785 Hyperlipidemia, unspecified: Secondary | ICD-10-CM | POA: Diagnosis not present

## 2020-05-17 DIAGNOSIS — I2510 Atherosclerotic heart disease of native coronary artery without angina pectoris: Secondary | ICD-10-CM | POA: Diagnosis not present

## 2020-05-17 DIAGNOSIS — I714 Abdominal aortic aneurysm, without rupture, unspecified: Secondary | ICD-10-CM

## 2020-05-17 DIAGNOSIS — M6289 Other specified disorders of muscle: Secondary | ICD-10-CM | POA: Diagnosis not present

## 2020-05-17 DIAGNOSIS — I208 Other forms of angina pectoris: Secondary | ICD-10-CM

## 2020-05-17 DIAGNOSIS — R002 Palpitations: Secondary | ICD-10-CM | POA: Diagnosis not present

## 2020-05-17 DIAGNOSIS — Z720 Tobacco use: Secondary | ICD-10-CM | POA: Diagnosis not present

## 2020-05-17 DIAGNOSIS — Z9861 Coronary angioplasty status: Secondary | ICD-10-CM

## 2020-05-17 DIAGNOSIS — R5382 Chronic fatigue, unspecified: Secondary | ICD-10-CM

## 2020-05-17 DIAGNOSIS — R5381 Other malaise: Secondary | ICD-10-CM

## 2020-05-17 DIAGNOSIS — I951 Orthostatic hypotension: Secondary | ICD-10-CM
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
## 2020-05-17 NOTE — Patient Instructions (Addendum)
Medication Instructions:  Continue all medications *If you need a refill on your cardiac medications before your next appointment, please call your pharmacy*   Lab Work: None ordered   Testing/Procedures: Lower ext arterial doppler Abd aorta/iliiac doppler   Follow-Up: At [facility], you and your health needs are our priority.  As part of our continuing mission to provide you with exceptional heart care, we have created designated Provider Care Teams.  These Care Teams include your primary Cardiologist (physician) and Advanced Practice Providers (APPs -  Physician Assistants and Nurse Practitioners) who all work together to provide you with the care you need, when you need it.  We recommend signing up for the patient portal called "MyChart".  Sign up information is provided on this After Visit Summary.  MyChart is used to connect with patients for Virtual Visits (Telemedicine).  Patients are able to view lab/test results, encounter notes, upcoming appointments, etc.  Non-urgent messages can be sent to your provider as well.   To learn more about what you can do with MyChart, go to [email or web address].    Your next appointment:  6 months   The format for your next appointment: Office    Provider:  Dr.Harding   Schedule appointment with [facility]

## 2020-05-18 ENCOUNTER — Telehealth: Payer: Self-pay | Admitting: Cardiology

## 2020-05-18 NOTE — Telephone Encounter (Signed)
Left message for patient to call and schedule LIPID visit with the Pharm D ordered by Dr. [name].

## 2020-05-22 ENCOUNTER — Other Ambulatory Visit: Payer: Self-pay

## 2020-05-22 ENCOUNTER — Ambulatory Visit (INDEPENDENT_AMBULATORY_CARE_PROVIDER_SITE_OTHER): Payer: Medicare HMO | Admitting: Pharmacist Clinician (PhC)/ Clinical Pharmacy Specialist

## 2020-05-22 DIAGNOSIS — Z720 Tobacco use: Secondary | ICD-10-CM | POA: Diagnosis not present

## 2020-05-22 DIAGNOSIS — E785 Hyperlipidemia, unspecified: Secondary | ICD-10-CM | POA: Diagnosis not present
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
## 2020-05-22 NOTE — Patient Instructions (Addendum)
Your Results:             Your most recent labs Goal  Total Cholesterol 163 < 200  Triglycerides 114 < 150  HDL (happy/good cholesterol) 50 > 40  LDL (lousy/bad cholesterol 92 < 70      Medication changes: We will start the paperwork to get Repatha (or Praluent) covered by your insurance.  [facility] will call you once this is approved.  When you get your injections you can call our office to schedule a time to come in for your first dose.  Call [phone] to schedule this Erasmo Downer, [place] or [place])  Ask your Pharmacist about nicotine patches.  You should do the 21 mg patches for 6 weeks, then drop to the 14 mg patches for 2-6 weeks, then 7 mg patches for 2-6 weeks.    Look for over the counter Pepcid AC 20 mg.  Take this every other day, alternating with pantoprazole.  Lab orders: We will have you repeat cholesterol labs in about 2 months.    Patient Assistance:  The [employer] foundation offers assistance to help pay for medication copays.  They will cover copays for all cholesterol lowering meds, including statins, fibrates, omega-3 oils, ezetimibe, Repatha, Praluent, Nexletol, Nexlizet.  The cards are usually good for $2,500 or 12 months, whichever comes first. 1. Go to healthwellfoundation.org 2. Click on "Apply Now" 3. Answer questions as to whom is applying (patient or representative) 4. Your disease fund will be "hypercholesterolemia - Medicare access" 5. They will ask questions about finances and which medications you are taking for cholesterol 6. When you submit, the approval is usually within minutes.  You will need to print the card information from the site 7. You will need to show this information to your pharmacy, they will bill your Medicare Part D plan first -then bill [employer] --for the copay.   You can also call them at [phone], although the hold times can be quite long.   Thank you for choosing [facility]

## 2020-05-23 ENCOUNTER — Encounter: Payer: Self-pay | Admitting: Pharmacist Clinician (PhC)/ Clinical Pharmacy Specialist

## 2020-05-23 NOTE — Assessment & Plan Note (Signed)
Patient with ASCVD and history of in-stent restenosis, not at goal with maximally tolerated statin (pravastatin 80 mg).  Reviewed options for lowering LDL cholesterol, specifically PCSK-9 inhibitors.  Discussed mechanisms of action, dosing, side effects and potential decreases in LDL cholesterol.  Answered all patient questions.  Based on this information, patient willing to start Repatha SureClick 217 mg every 2 weeks.  She is hesitant about self injection, and was invited to call the office once she has the prescription - we can arrange for her to come into the office for her first dose teaching.  Will need to repeat labs after 5-6 doses.

## 2020-05-23 NOTE — Assessment & Plan Note (Signed)
Reviewed her smoking history - she states would like to quit, however patches are too expensive.  Partner suggested he would be willing to pay if insurance won't.  Explained that sometimes it is a matter of finding the brand covered by her plan and she should check with the pharmacy, as they should be able to determine this from the denied claim.  Prescription was called in by another provider earlier this month, he will look into that and see if they can get patches.  Reviewed directions, that she should use 21 mg patches for 6 weeks, then 14 mg x 2-6 weeks, then 7 mg x 2-6 weeks.  She has chronic cough, for which she complains Mucinex is not helping.  Explained that smoking can also cause this, and the best way to rid herself of the cough is to stop smoking.

## 2020-05-29 ENCOUNTER — Ambulatory Visit (INDEPENDENT_AMBULATORY_CARE_PROVIDER_SITE_OTHER): Payer: Medicare HMO | Admitting: Gastroenterology

## 2020-05-29 ENCOUNTER — Encounter: Payer: Self-pay | Admitting: Gastroenterology

## 2020-05-29 VITALS — BP 130/70 | HR 81 | Ht 65.5 in | Wt 133.0 lb

## 2020-05-29 DIAGNOSIS — K5900 Constipation, unspecified: Secondary | ICD-10-CM

## 2020-05-29 DIAGNOSIS — R109 Unspecified abdominal pain: Secondary | ICD-10-CM

## 2020-05-29 MED ORDER — LUBIPROSTONE 24 MCG PO CAPS: 24.0000 ug | ORAL_CAPSULE | Freq: Two times a day (BID) | ORAL | 3 refills | Status: DC
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
## 2020-05-29 NOTE — Patient Instructions (Signed)
If you are age 66 or older, your body mass index should be between 23-30. Your Body mass index is 21.8 kg/m. If this is out of the aforementioned range listed, please consider follow up with your Primary Care Provider.  If you are age 30 or younger, your body mass index should be between 19-25. Your Body mass index is 21.8 kg/m. If this is out of the aformentioned range listed, please consider follow up with your Primary Care Provider.   We have sent the following medications to your pharmacy for you to pick up at your convenience: Amitiza  Thank you for choosing me and [facility].  Owens Loffler, MD

## 2020-05-30 ENCOUNTER — Telehealth: Payer: Self-pay

## 2020-05-30 NOTE — Telephone Encounter (Signed)
Appeal was faxed to [place]

## 2020-05-30 NOTE — Telephone Encounter (Signed)
She has failed lactulose and linzess (documented both in recent OV note).  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    Please file an appeal for the linzess, thanks

## 2020-05-30 NOTE — Telephone Encounter (Signed)
The Amitiza was denied by patients insurance. They will cover lactulose , Linzess , Movantik, and relistor. The patient did state that Linzess makes her sick. Would you like to send in something else or do you want me to file an appeal with her insurance company.

## 2020-05-30 NOTE — Telephone Encounter (Signed)
Called Patient and informed her that the appeal for  Amitiza was approved.

## 2020-05-31 ENCOUNTER — Other Ambulatory Visit: Payer: Self-pay | Admitting: Cardiology

## 2020-05-31 DIAGNOSIS — I739 Peripheral vascular disease, unspecified: Secondary | ICD-10-CM

## 2020-05-31 DIAGNOSIS — E785 Hyperlipidemia, unspecified: Secondary | ICD-10-CM

## 2020-05-31 DIAGNOSIS — B379 Candidiasis, unspecified: Secondary | ICD-10-CM

## 2020-06-02 ENCOUNTER — Encounter: Payer: Self-pay | Admitting: Cardiology

## 2020-06-02 DIAGNOSIS — M6289 Other specified disorders of muscle: Secondary | ICD-10-CM | POA: Insufficient documentation

## 2020-06-02 NOTE — Assessment & Plan Note (Addendum)
Complicated issue.  Chantix is now off the market.  She was doing okay with nicotine patches-- We will try to for nicotine patches for her to help out allowing for insurance coverage.  She will discuss this with [facility] pharmacists.  5 minutes spent

## 2020-06-02 NOTE — Assessment & Plan Note (Signed)
No evidence of AAA seen on recent abdominal CT scan.

## 2020-06-02 NOTE — Assessment & Plan Note (Signed)
I cannot tell if she is having claudication or not.  She has not had laboratory arterial Dopplers checked in a while.   Plan: Recheck extremity arterial Dopplers.

## 2020-06-02 NOTE — Assessment & Plan Note (Signed)
Remains on Imdur.  Symptoms seem to be stable.  We will prefer to keep her on lower dose of Imdur to avoid hypotension and headaches.

## 2020-06-02 NOTE — Assessment & Plan Note (Signed)
She has extensive CAD of the RCA and circumflex with now occluded distal circumflex.  Stable "angina "with multiple different chest pain symptoms that been evaluated with MR nonischemic Myoview stress tests.   Plan:   Continue maintenance dose of clopidogrel --> okay to 25 days prior to surgery procedure.  Continue Imdur-not on beta-blocker or ARB because of extreme hypotension despite palpitations.  She is on pravastatin and Vascepa--plan is to consider PCSK9 inhibitor

## 2020-06-02 NOTE — Assessment & Plan Note (Addendum)
Thankfully, this seems to be less of an issue the more distant she gets from her cancer therapy.  We are continuing to keep her off of any antihypertensives besides Imdur.   She is no longer on midodrine.  Continue recommend adequate hydration as well as support stockings when on her feet.[name]

## 2020-06-02 NOTE — Assessment & Plan Note (Signed)
She has had multiple instances when palpitations have been evaluated by monitor not showing any evidence of arrhythmia.  For now, try to hold off on beta-blocker since her blood pressures finally start to become normal.  I think this is probably more related to anxiety.  Recommend adequate hydration and as needed Xanax.

## 2020-06-02 NOTE — Assessment & Plan Note (Addendum)
It is not adequately controlled despite being on pravastatin and Vascepa.  LDL still 92. Refer to [facility] lipid clinic. Probably will need PCSK9 inhibitor

## 2020-06-02 NOTE — Assessment & Plan Note (Signed)
I definitely think this is probably at least in some part related to anxiety.  She is now on PRN alprazolam, but is not longstanding antidepressant agent.  Will defer to PCP, but need to consider treatment.

## 2020-06-06 ENCOUNTER — Telehealth: Payer: Self-pay | Admitting: Gastroenterology

## 2020-06-06 NOTE — Telephone Encounter (Signed)
Per Lynnell Grain, CMA Amitiza 61mcg was approved and pharmacy has been notified.  Patient was also notified by [facility] to pick up Rx at pharmacy and contact our office with any questions or concerns.

## 2020-06-06 NOTE — Telephone Encounter (Signed)
Mayra with [employer] called requesting clinical information for Amatiza. Pls call her at [phone]. Ref # [id number].

## 2020-06-07 DIAGNOSIS — Z20822 Contact with and (suspected) exposure to covid-19: Secondary | ICD-10-CM | POA: Diagnosis not present

## 2020-06-08 ENCOUNTER — Ambulatory Visit (HOSPITAL_COMMUNITY)
Admission: RE | Admit: 2020-06-08 | Discharge: 2020-06-08 | Disposition: A | Discharge status: Home / Self Care | Payer: Medicare HMO | Source: Ambulatory Visit | Attending: Cardiovascular Disease | Admitting: Cardiovascular Disease

## 2020-06-08 ENCOUNTER — Other Ambulatory Visit: Payer: Self-pay

## 2020-06-08 ENCOUNTER — Other Ambulatory Visit: Payer: Self-pay | Admitting: Cardiology

## 2020-06-08 DIAGNOSIS — I2510 Atherosclerotic heart disease of native coronary artery without angina pectoris: Secondary | ICD-10-CM | POA: Diagnosis not present

## 2020-06-08 DIAGNOSIS — B379 Candidiasis, unspecified: Secondary | ICD-10-CM

## 2020-06-08 DIAGNOSIS — I739 Peripheral vascular disease, unspecified: Secondary | ICD-10-CM | POA: Diagnosis not present

## 2020-06-08 DIAGNOSIS — Z9861 Coronary angioplasty status: Secondary | ICD-10-CM | POA: Insufficient documentation

## 2020-06-08 DIAGNOSIS — E785 Hyperlipidemia, unspecified: Secondary | ICD-10-CM

## 2020-06-09 ENCOUNTER — Telehealth: Payer: Self-pay | Admitting: Cardiology

## 2020-06-09 ENCOUNTER — Other Ambulatory Visit: Payer: Self-pay

## 2020-06-09 DIAGNOSIS — R300 Dysuria: Secondary | ICD-10-CM | POA: Diagnosis not present

## 2020-06-09 DIAGNOSIS — N3941 Urge incontinence: Secondary | ICD-10-CM | POA: Diagnosis not present

## 2020-06-09 DIAGNOSIS — N3020 Other chronic cystitis without hematuria: Secondary | ICD-10-CM | POA: Diagnosis not present

## 2020-06-09 MED ORDER — EZETIMIBE 10 MG PO TABS: 10.0000 mg | ORAL_TABLET | Freq: Every day | ORAL | 3 refills | Status: DC

## 2020-06-09 NOTE — Telephone Encounter (Signed)
Pt is questioning why her ezetimibe is so expensive. Advised patient that [facility] tend to offer ezetimibe at a lower cost than other pharmacies. Advised the patient to contact her insurance carrier and pharmacy directly to further explore ways to reduce cost. The patient verbalizes understanding and agreement with plan.

## 2020-06-09 NOTE — Telephone Encounter (Signed)
Pt c/o medication issue:  1. Name of Medication: Repatha  2. How are you currently taking this medication (dosage and times per day)? N/A  3. Are you having a reaction (difficulty breathing--STAT)? No  4. What is your medication issue? Patient states her insurance is requesting a prior authorization from Dr. [name] prior to distributing the medication. She also states a detailed voice message may be left if she is not available when her call is returned.

## 2020-06-09 NOTE — Telephone Encounter (Signed)
Patient said she was charged $30 for her medicine. Patient is not sure what medicine it was. She said she can not afford that medication and wants to know what we can do to help.   Please advise

## 2020-06-11 ENCOUNTER — Telehealth: Payer: Self-pay | Admitting: Cardiology

## 2020-06-11 DIAGNOSIS — E785 Hyperlipidemia, unspecified: Secondary | ICD-10-CM

## 2020-06-11 NOTE — Telephone Encounter (Signed)
Pt called with questions about her meds for cholesterol.  I told her she needed to discuss on Monday- will send message to pharmacy that prescribed to call her Monday

## 2020-06-12 MED ORDER — REPATHA SURECLICK 140 MG/ML ~~LOC~~ SOAJ: 140.0000 mg | SUBCUTANEOUS | 11 refills | Status: DC

## 2020-06-12 NOTE — Telephone Encounter (Signed)
Returned a call to pt stated that she was approved from the repatha pa sent to [place], rx sent, pt instructed to apply for healthwell and to complete fasting lipid panel after 4h shot no appt needed, pt voiced understanding

## 2020-06-12 NOTE — Telephone Encounter (Signed)
caled and spoke w/pt and stated that I will be working on getting repatha approved but she also has additional questions about zetia that she would like to speak w/[facility] regarding will route to her for that

## 2020-06-12 NOTE — Addendum Note (Signed)
Addended by: Allean Found on: 06/12/2020 09:08 AM   Modules accepted: Orders

## 2020-06-13 ENCOUNTER — Telehealth: Payer: Self-pay | Admitting: Pharmacist

## 2020-06-13 NOTE — Telephone Encounter (Signed)
Repatha Demo at [facility] on Friday at 2pm  Patient has medicare and Medicaid. Copay $4.00. No need for [place]

## 2020-06-13 NOTE — Telephone Encounter (Signed)
Patient questions answered, coming into office Friday for first dose PCSK-9

## 2020-06-15 DIAGNOSIS — N952 Postmenopausal atrophic vaginitis: Secondary | ICD-10-CM | POA: Diagnosis not present

## 2020-06-15 DIAGNOSIS — R300 Dysuria: Secondary | ICD-10-CM | POA: Diagnosis not present

## 2020-06-15 DIAGNOSIS — N3020 Other chronic cystitis without hematuria: Secondary | ICD-10-CM | POA: Diagnosis not present

## 2020-06-16 ENCOUNTER — Telehealth: Payer: Self-pay | Admitting: Pharmacist

## 2020-06-16 MED ORDER — PRAVASTATIN SODIUM 80 MG PO TABS
ORAL_TABLET | ORAL | 3 refills | Status: DC
Start: 2020-06-16 — End: 2021-04-23

## 2020-06-20 NOTE — Telephone Encounter (Signed)
On Repatha. Pravastatin dose decreased

## 2020-06-28 ENCOUNTER — Telehealth: Payer: Self-pay | Admitting: Gastroenterology

## 2020-06-28 NOTE — Telephone Encounter (Signed)
Inbound call from patient requesting a call back from the nurse please.  Has questions in regards to her EGD she had 10/2018.

## 2020-06-28 NOTE — Telephone Encounter (Signed)
The pt wanted to know how Dr Ardis Hughs knew she had yeast when she had her last EGD in 10/2018.  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 She was treated with diflucan.  She states her son has some issues with his esophagus and she is worried about the EGD report stating she had lesions.  "Multiple white exudative lesions throughout the esophagus, diagnostic of Candida Esophagitis" I tried to explain to her that the lesions seen during her exam was as per the report diagnostic of candida.  She had several more questions asked to be scheduled and appt with Dr Ardis Hughs.  Appt made for first afternoon appt per pt request of 3/29.

## 2020-06-29 DIAGNOSIS — L218 Other seborrheic dermatitis: Secondary | ICD-10-CM | POA: Diagnosis not present

## 2020-06-29 DIAGNOSIS — L578 Other skin changes due to chronic exposure to nonionizing radiation: Secondary | ICD-10-CM | POA: Diagnosis not present

## 2020-06-29 IMAGING — CT CT CHEST W/ CM
2 of 3 series · 15 of 36 positions shown, 18 images · IV contrast (omnipaque)
Comparison: CT 08/29/2017.  PET-CT 02/26/2017.

CLINICAL DATA: Cough for months. Right sided small cell lung cancer
diagnosed in 2133. Chemotherapy and radiation therapy in progress.

EXAM:
CT CHEST WITH CONTRAST
TECHNIQUE: Multidetector CT imaging of the chest was performed during
intravenous contrast administration.
CONTRAST:  75mL OMNIPAQUE IOHEXOL 300 MG/ML  SOLN

[Series 2: axial st · axial · 0.69mm/px · z∈[-257,-15]mm · 12 of 143 slices shown, 15 images]
[im 11/143  mediastinal]
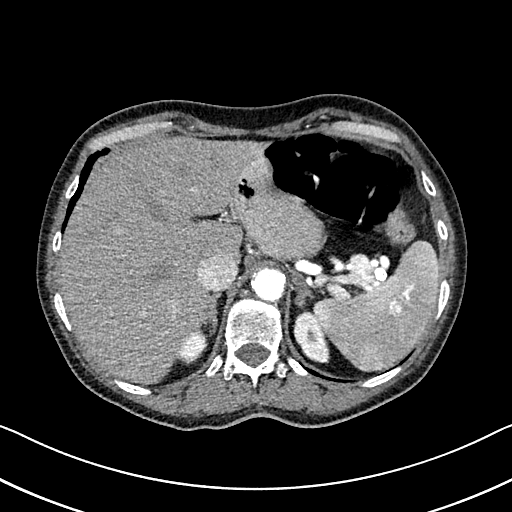
[im 11/143  lung]
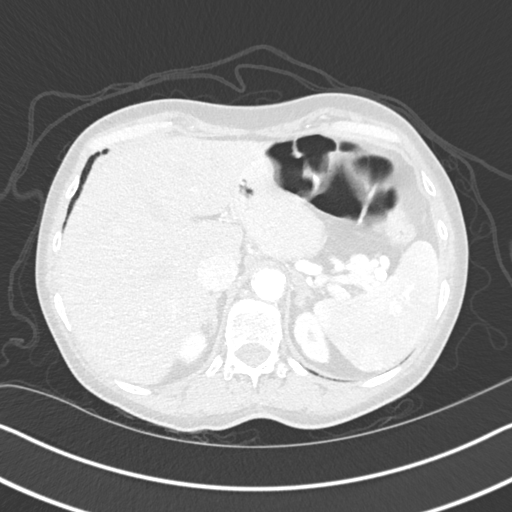
[im 22/143  lung]
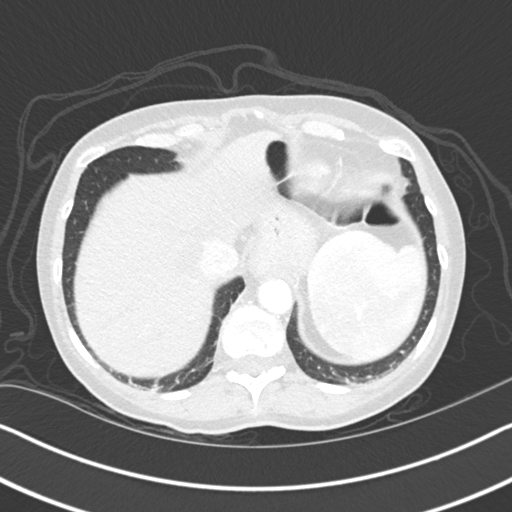
[im 32/143  lung]
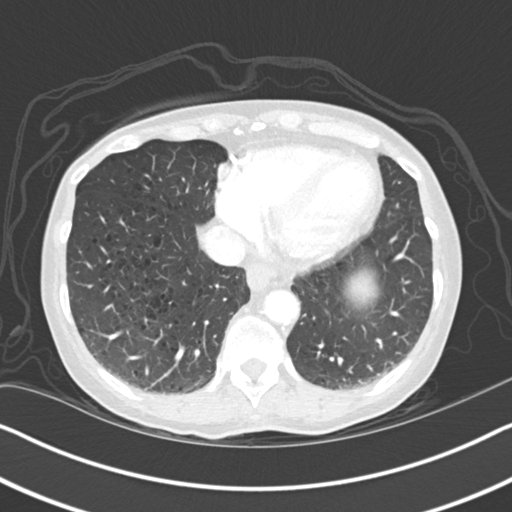
[im 43/143  lung]
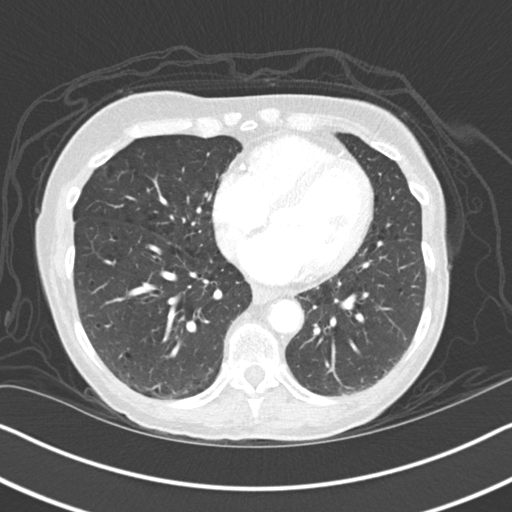
[im 53/143  mediastinal]
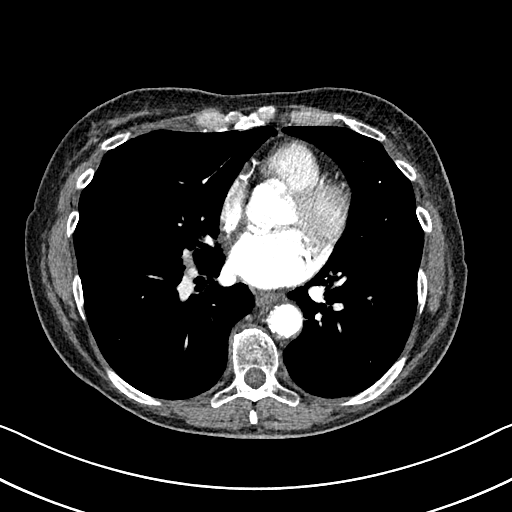
[im 53/143  lung]
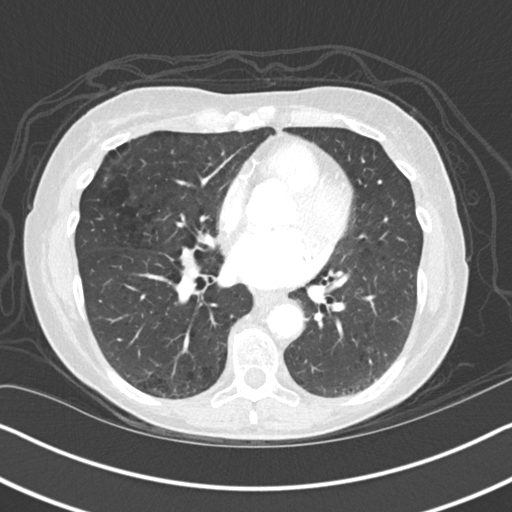
[im 64/143  lung]
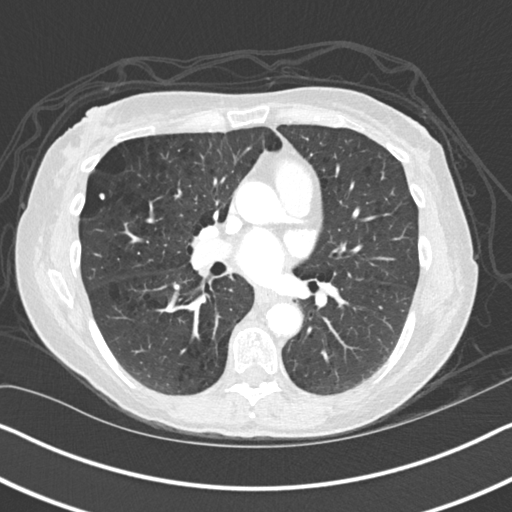
[im 79/143  lung]
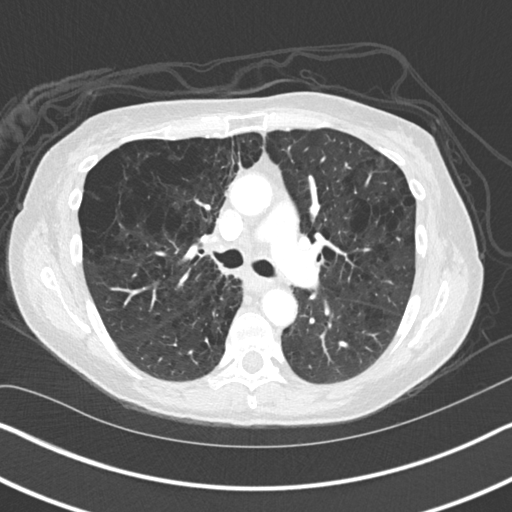
[im 90/143  lung]
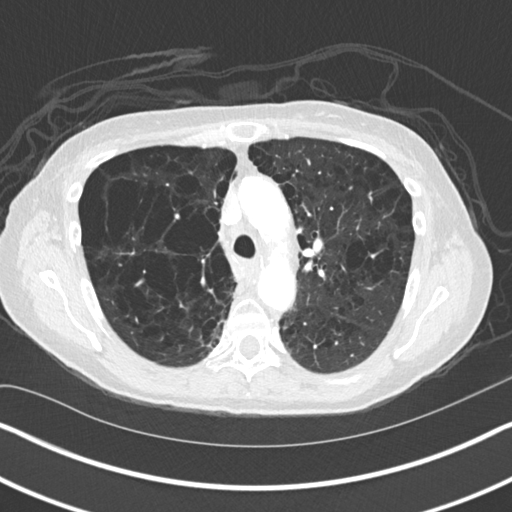
[im 100/143  mediastinal]
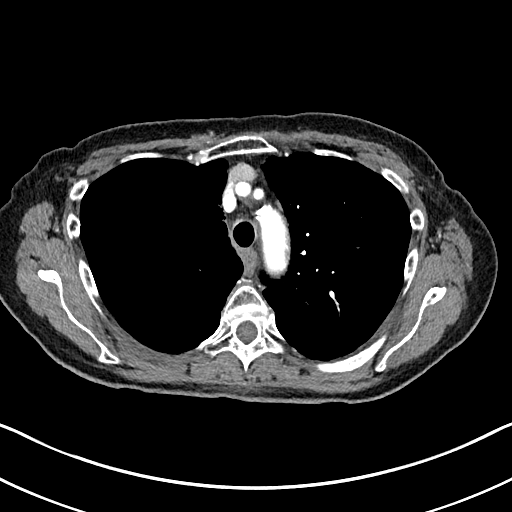
[im 100/143  lung]
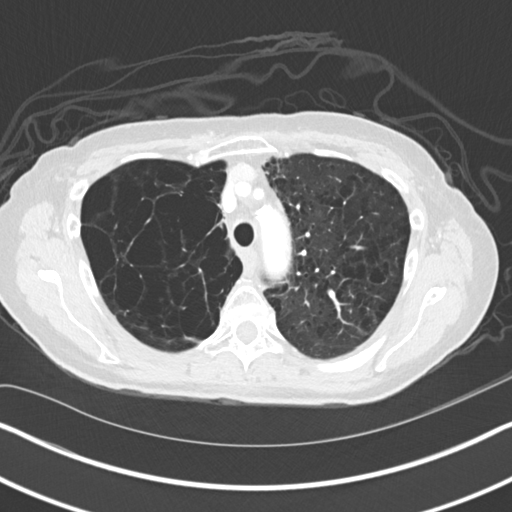
[im 111/143  lung]
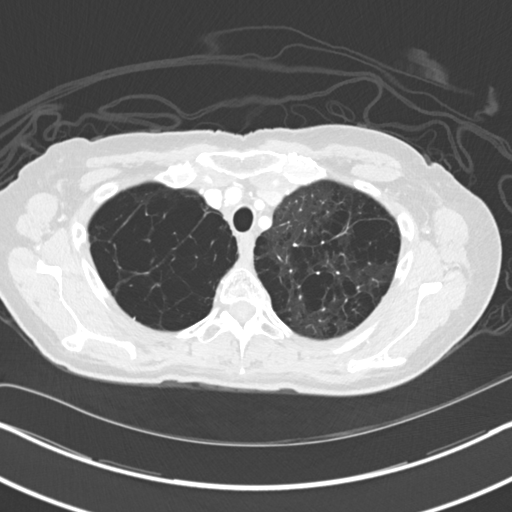
[im 121/143  lung]
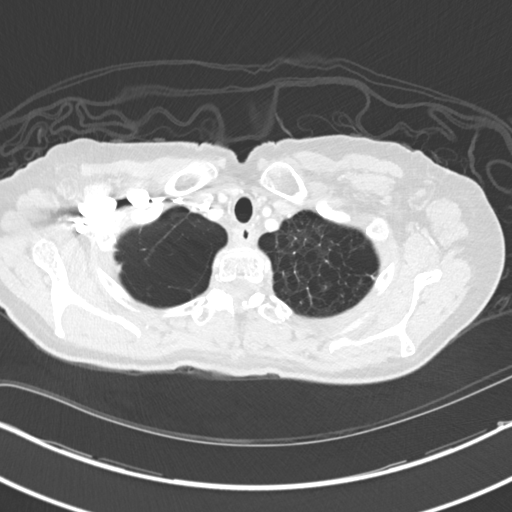
[im 132/143  lung]
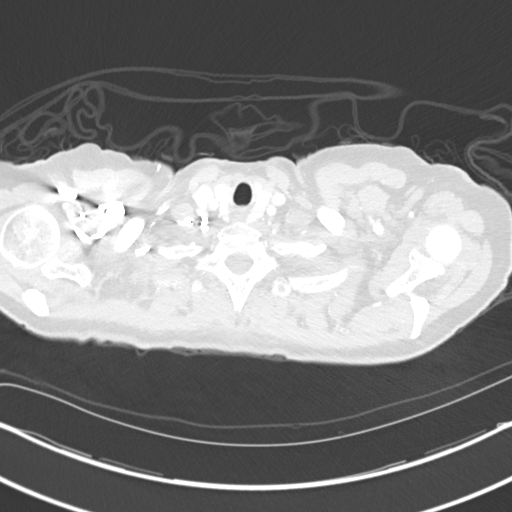

[Series 6: coronal · coronal · 0.57mm/px · 3 of 121 slices shown]
[im 25/121  lung]
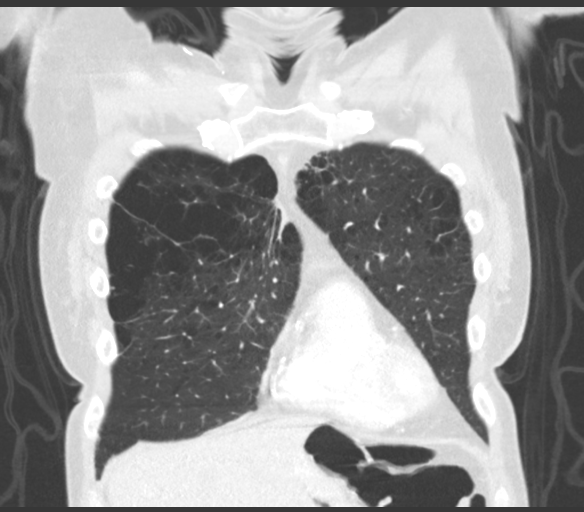
[im 49/121  lung]
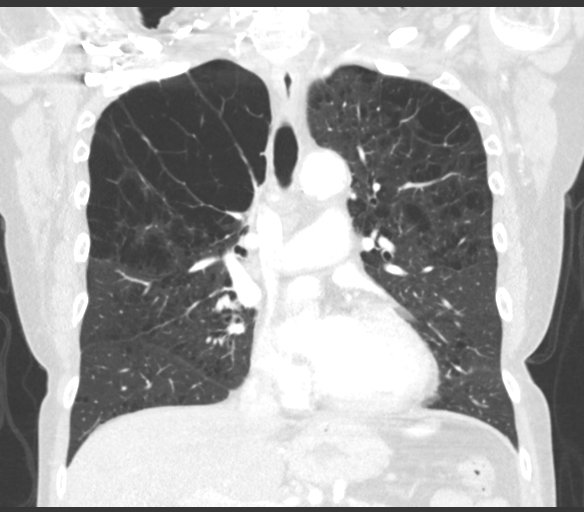
[im 73/121  lung]
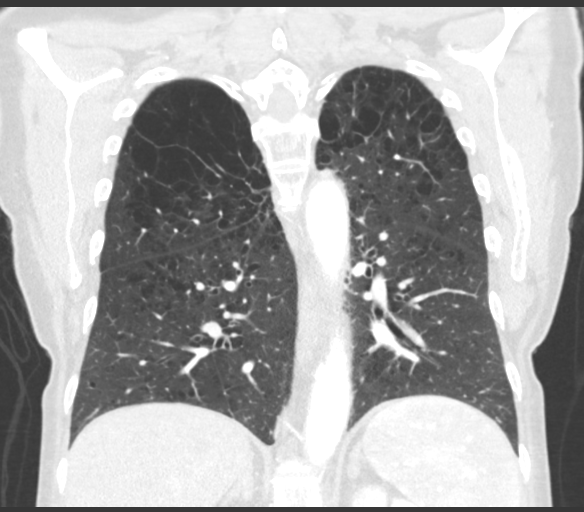

[15 of 36 positions shown; findings below may reference images not displayed]

FINDINGS: Cardiovascular: Diffuse atherosclerosis of the aorta, great vessels
and coronary arteries. No acute vascular findings are demonstrated.
The heart size is normal. There is no pericardial effusion.

Mediastinum/Nodes: No discretely enlarged mediastinal, hilar or
axillary lymph nodes are identified. There are small partially
calcified paratracheal nodes with soft tissue thickening centrally
in the mediastinal, likely related to prior radiation therapy. There
is associated mild diffuse esophageal wall thickening without
significant distention. The trachea and thyroid gland appear
unremarkable.

Lungs/Pleura: There is no pleural effusion or pneumothorax. There is
severe centrilobular and paraseptal emphysema, especially within the
right upper lobe. New well-circumscribed 4 mm right upper lobe
nodule on image 80/143 appears partially calcified, likely a
granuloma. No suspicious pulmonary nodule or confluent airspace
opacity.

Upper abdomen: The visualized upper abdomen appears stable without
suspicious findings. The adrenal glands appear stable.

Musculoskeletal/Chest wall: There is no chest wall mass or
suspicious osseous finding.
IMPRESSION: 1. No definite acute findings or evidence of metastatic disease.
2. Central mediastinal soft tissue thickening and esophageal wall
thickening, likely related to prior radiation therapy.
3. Severe Emphysema ([id number].3).
4.  Aortic Atherosclerosis ([id number].J).

## 2020-07-06 ENCOUNTER — Telehealth: Payer: Self-pay | Admitting: Pharmacist

## 2020-07-06 NOTE — Telephone Encounter (Signed)
Repatha product replacement authorized Cristie Hem PharmD approved)

## 2020-07-13 IMAGING — CR DG CHEST 2V
2 series · 2 of 2 positions shown · non-contrast
Comparison: CT chest 03/02/2018, radiograph 11/19/2017

CLINICAL DATA: Weakness

EXAM:
CHEST - 2 VIEW

[w chest lat]
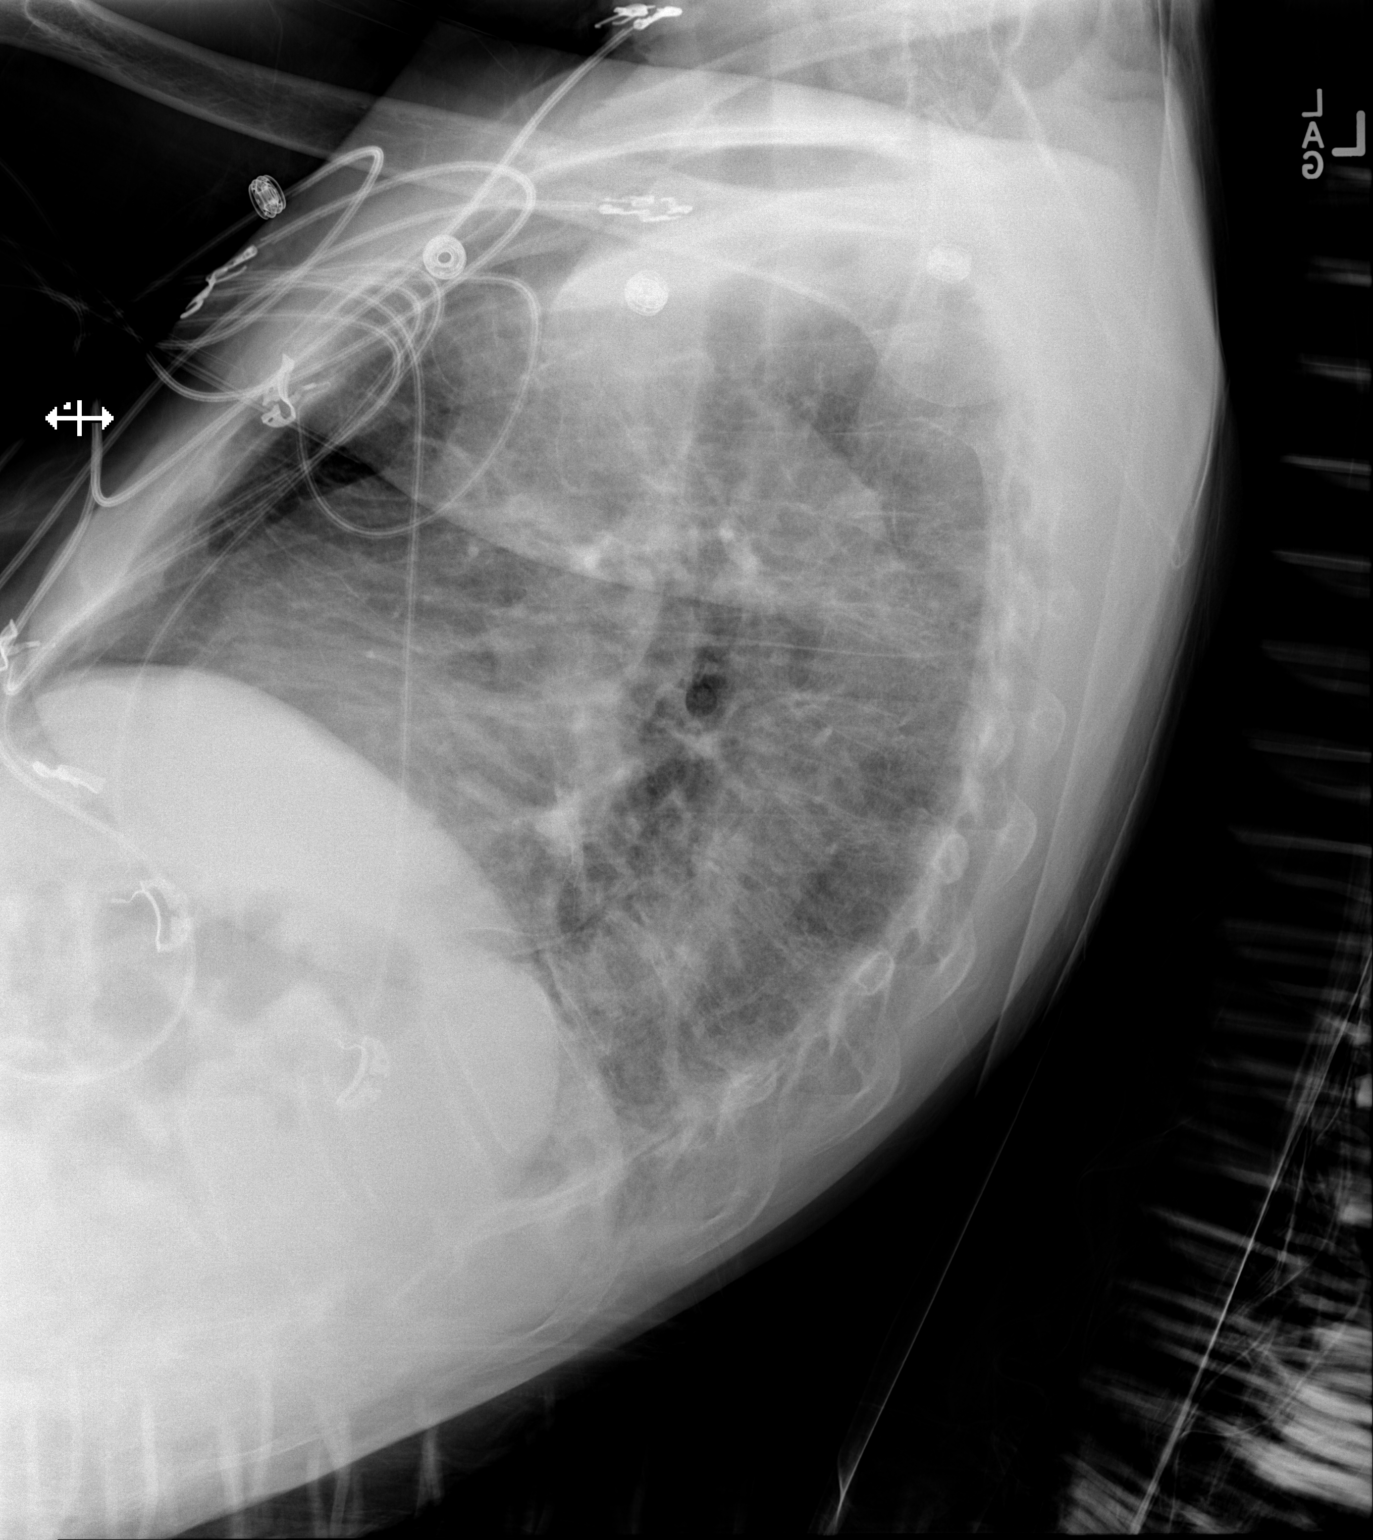

[x chest ap]
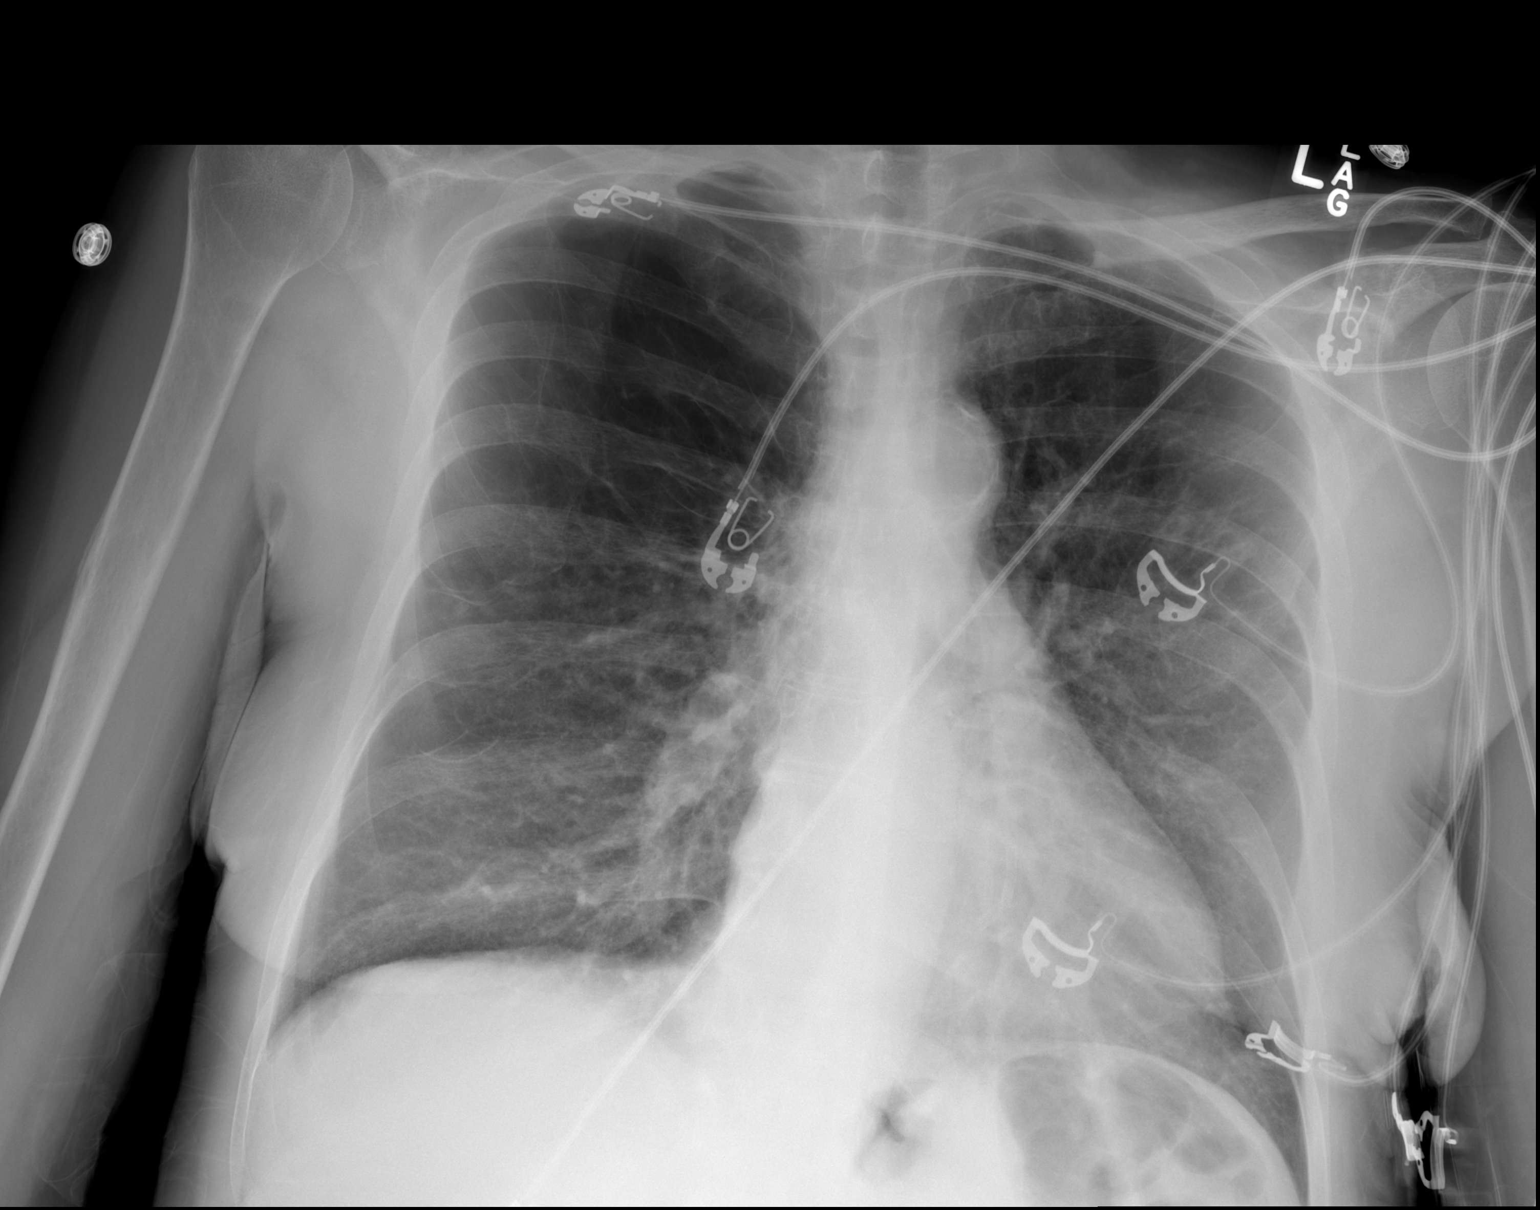

[2 of 2 positions shown; findings below may reference images not displayed]

FINDINGS: Hyperinflation with severe emphysematous disease. No focal opacity
or pleural effusion. Stable cardiomediastinal silhouette with aortic
atherosclerosis. No pneumothorax. A small nodular opacity at the
left apex is suspected to represent a summation shadow.
IMPRESSION: Hyperinflation with emphysematous disease. No acute opacity since
prior exam.

## 2020-07-13 IMAGING — CT CT ABD-PELV W/ CM
2 of 5 series · 15 of 46 positions shown, 17 images · IV contrast (ISOVUE)
Comparison: 05/06/2017

CLINICAL DATA: Chronic lower abdominal pain and back pain with
gross hematuria. Also bleeding per rectum. Dizziness and weakness.

EXAM:
CT ABDOMEN AND PELVIS WITH CONTRAST
TECHNIQUE: Multidetector CT imaging of the abdomen and pelvis was performed
using the standard protocol following bolus administration of
intravenous contrast.
CONTRAST:  100 cc [id number]

[Series 2: axial st · axial · 0.65mm/px · z∈[+1040,+1430]mm · 12 of 91 slices shown, 14 images]
[im 7/91  soft-tissue]
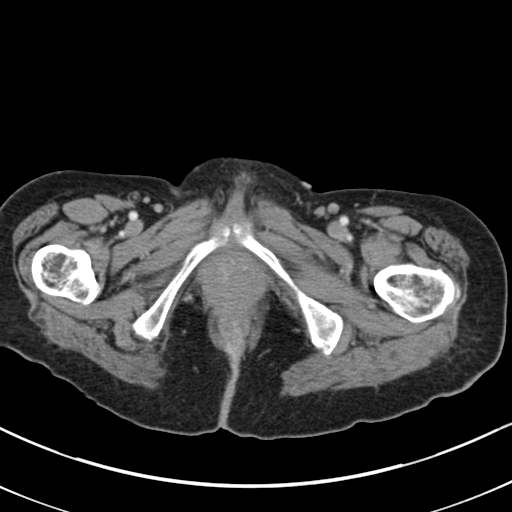
[im 7/91  bone]
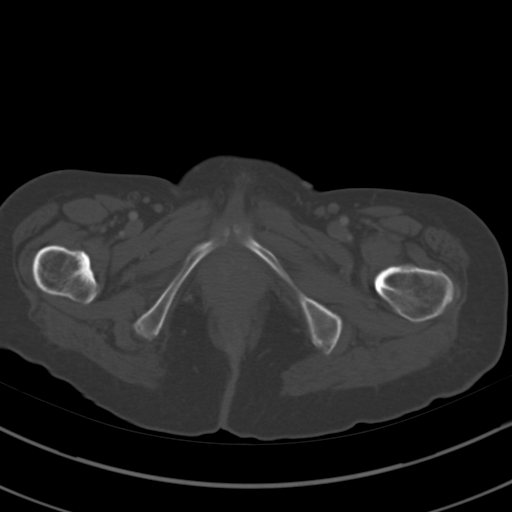
[im 13/91  soft-tissue]
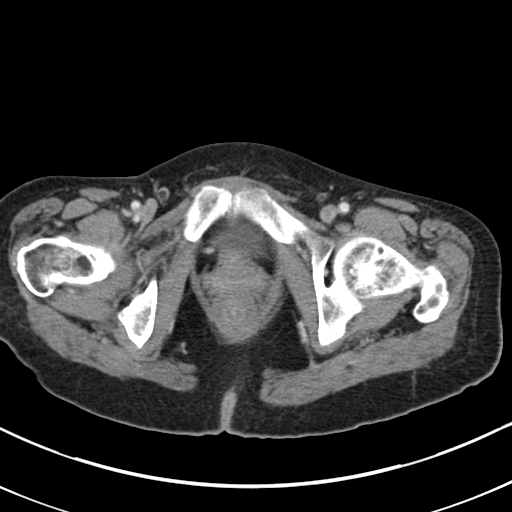
[im 19/91  soft-tissue]
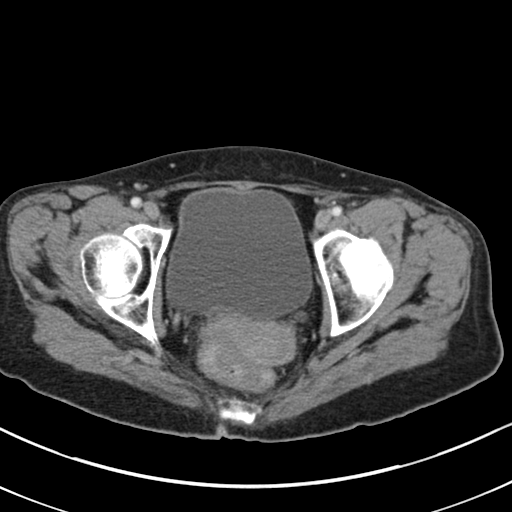
[im 31/91  soft-tissue]
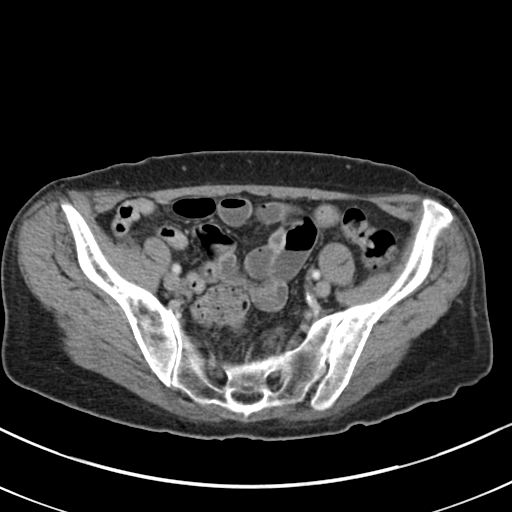
[im 37/91  soft-tissue]
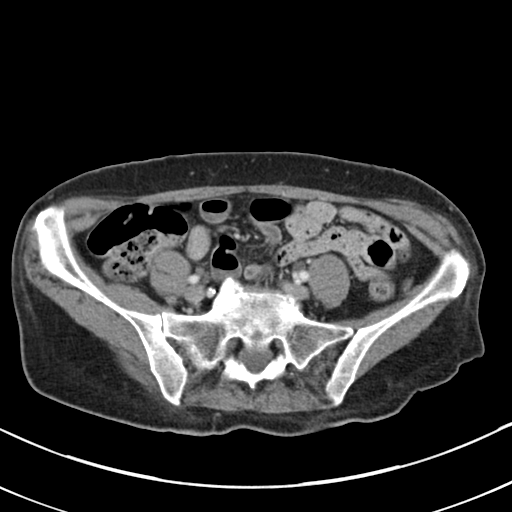
[im 43/91  soft-tissue]
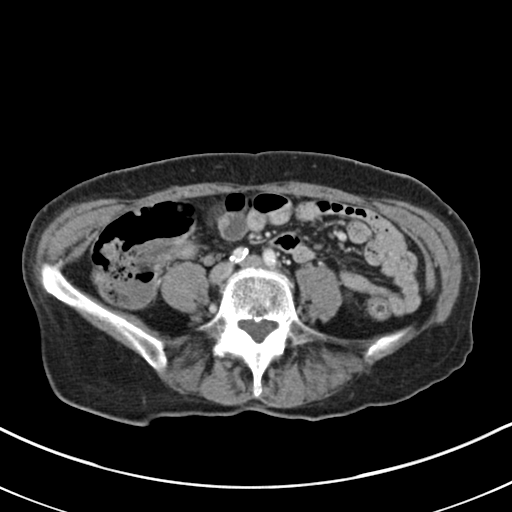
[im 49/91  soft-tissue]
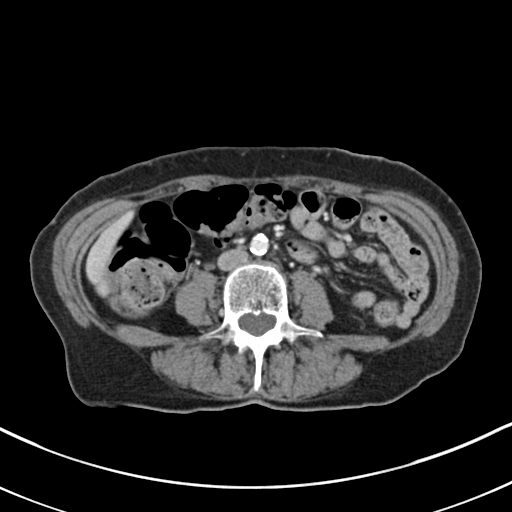
[im 55/91  soft-tissue]
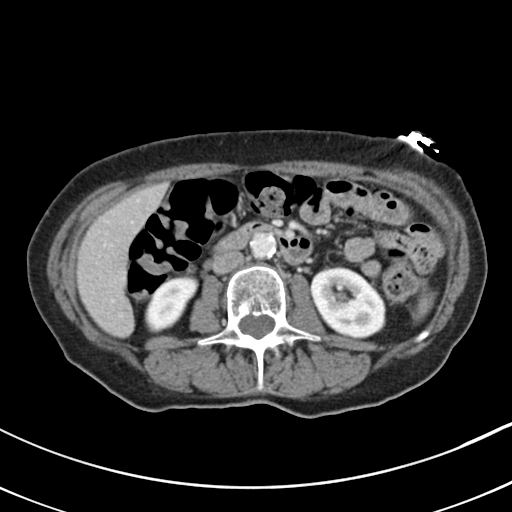
[im 61/91  soft-tissue]
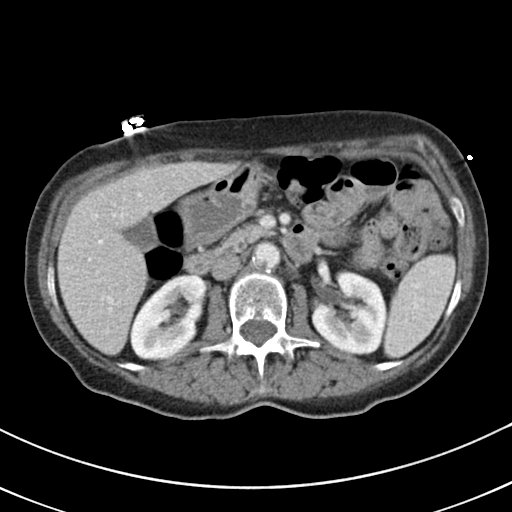
[im 61/91  bone]
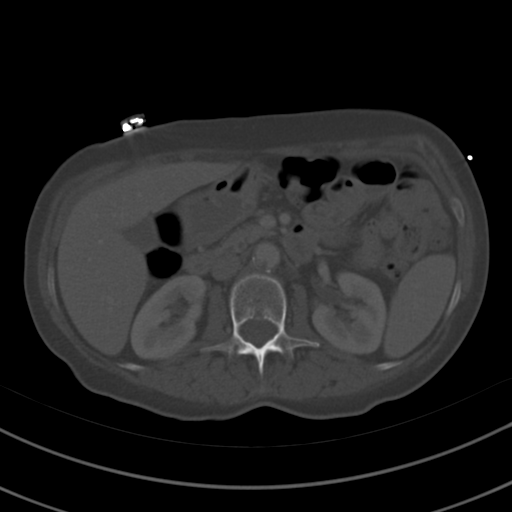
[im 73/91  soft-tissue]
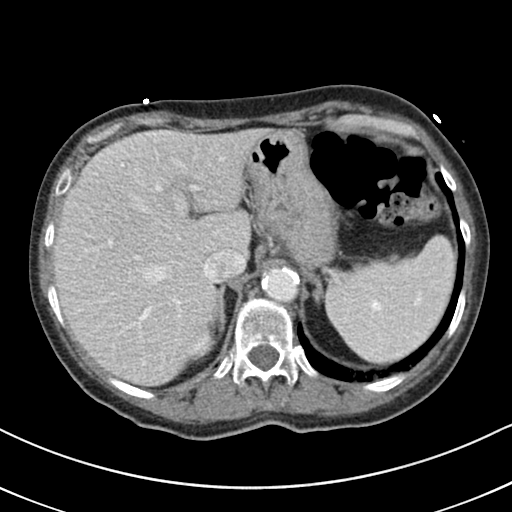
[im 79/91  soft-tissue]
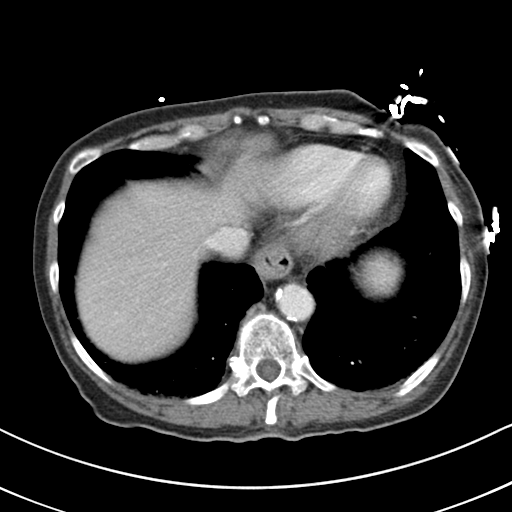
[im 85/91  soft-tissue]
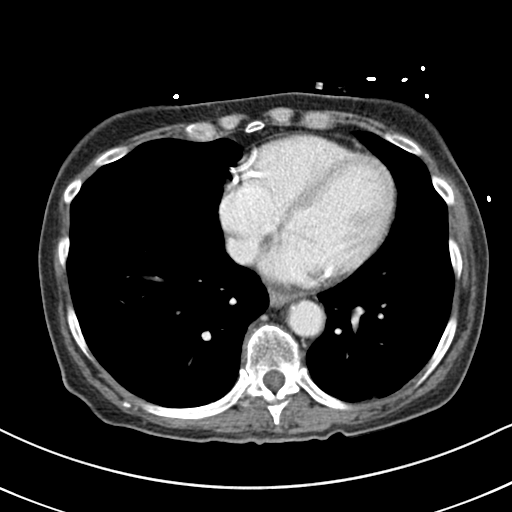

[Series 4: coronal st · coronal · 0.56mm/px · 3 of 67 slices shown]
[im 23/67  soft-tissue]
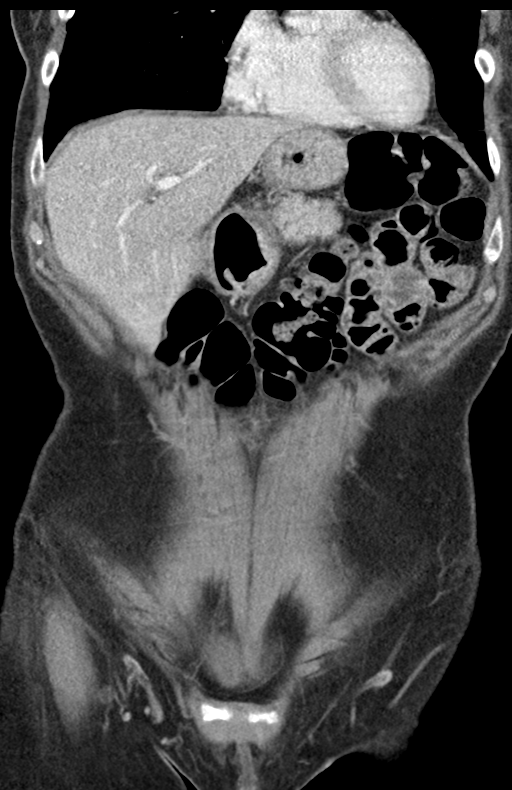
[im 30/67  soft-tissue]
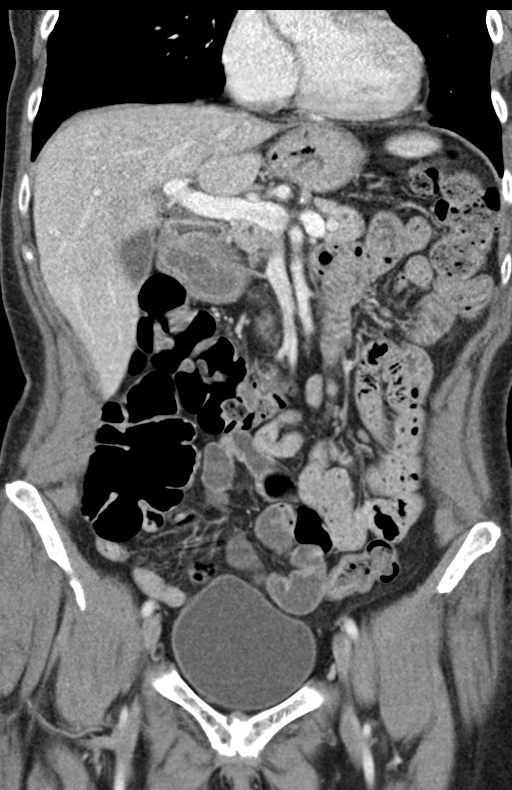
[im 37/67  soft-tissue]
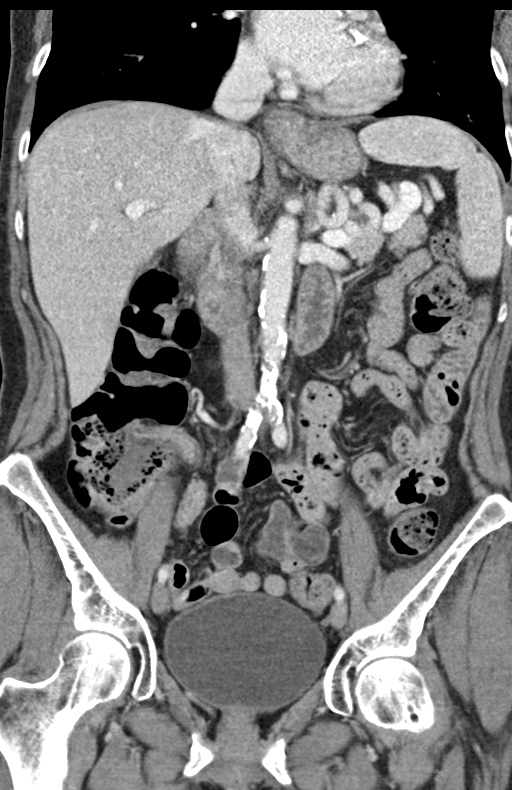

[15 of 46 positions shown; findings below may reference images not displayed]

FINDINGS: Lower chest: Widespread emphysema. No active process. No focal
lesion.

Hepatobiliary: Normal

Pancreas: Normal

Spleen: Normal parenchyma. Small calcified splenic artery aneurysm,
not significant.

Adrenals/Urinary Tract: Adrenal glands are normal. Kidneys are
normal except for a 1.3 cm cyst in the upper pole on the right. No
bladder abnormality is seen.

Stomach/Bowel: No visible bowel pathology. No evidence of mass,
obstruction or inflammatory disease.

Vascular/Lymphatic: Aortic atherosclerosis. No aneurysm. IVC is
normal. No retroperitoneal adenopathy.

Reproductive: No pelvic mass.

Other: No free fluid or air.

Musculoskeletal: Ordinary lumbar degenerative changes.
IMPRESSION: No abnormality seen to explain the clinical presentation. No
evidence of urinary tract pathology. No evidence of bowel pathology.

Aortic atherosclerosis, advanced.

Emphysema.

## 2020-07-17 ENCOUNTER — Telehealth: Payer: Self-pay | Admitting: Cardiology

## 2020-07-17 NOTE — Telephone Encounter (Signed)
Called patient, advised of message below. Patient verbalized understanding.

## 2020-07-17 NOTE — Telephone Encounter (Signed)
Pt c/o medication issue:  1. Name of Medication: ezetimibe (ZETIA) 10 MG tablet   2. How are you currently taking this medication (dosage and times per day)? Patient not taking    3. Are you having a reaction (difficulty breathing--STAT)?   4. What is your medication issue? Patient wanted to make sure she is not supposed to be taking this medication. She has documented on other paperwork to stop this medication (which she has) but can not remember why. Please advise

## 2020-07-17 NOTE — Telephone Encounter (Signed)
Will route to PharmD to verify to make sure patient should not be taking the ZETIA.   Thank you!

## 2020-07-17 NOTE — Telephone Encounter (Signed)
Correct.   Patient was instructed to stay on pravastatin on Mondays, Wednesdays and Fridays Take Repatha 140mg  every 14 days  No additional therapy for cholesterol. (medication list was update as well).

## 2020-07-21 DIAGNOSIS — E785 Hyperlipidemia, unspecified: Secondary | ICD-10-CM | POA: Diagnosis not present

## 2020-07-21 DIAGNOSIS — H11441 Conjunctival cysts, right eye: Secondary | ICD-10-CM | POA: Diagnosis not present

## 2020-07-21 DIAGNOSIS — H16223 Keratoconjunctivitis sicca, not specified as Sjogren's, bilateral: Secondary | ICD-10-CM | POA: Diagnosis not present

## 2020-07-21 DIAGNOSIS — H04123 Dry eye syndrome of bilateral lacrimal glands: Secondary | ICD-10-CM | POA: Diagnosis not present

## 2020-07-22 LAB — HEPATIC FUNCTION PANEL
ALT: 9 IU/L (ref 0–32)
AST: 14 IU/L (ref 0–40)
Albumin: 4.4 g/dL (ref 3.8–4.8)
Alkaline Phosphatase: 69 IU/L (ref 44–121)
Bilirubin Total: 0.3 mg/dL (ref 0.0–1.2)
Bilirubin, Direct: 0.11 mg/dL (ref 0.00–0.40)
Total Protein: 6.5 g/dL (ref 6.0–8.5)

## 2020-07-22 LAB — LIPID PANEL
Chol/HDL Ratio: 2.2 ratio (ref 0.0–4.4)
Cholesterol, Total: 109 mg/dL (ref 100–199)
HDL: 50 mg/dL (ref >39)
LDL Chol Calc (NIH): 41 mg/dL (ref 0–99)
Triglycerides: 94 mg/dL (ref 0–149)
VLDL Cholesterol Cal: 18 mg/dL (ref 5–40)

## 2020-07-25 ENCOUNTER — Telehealth: Payer: Self-pay | Admitting: Cardiology

## 2020-07-25 NOTE — Telephone Encounter (Signed)
Follow Up:     Pt would like for somebody to please explain her lab results from last week.

## 2020-07-25 NOTE — Telephone Encounter (Signed)
Patient calling in for results of lipid / liver tests from 07/21/20. Informed patient results need to be reviewed by MD and then a nurse will call to discuss. Patient verbalized understanding.

## 2020-07-25 NOTE — Telephone Encounter (Signed)
Labs are not forwarded to me to review.  Liver function tests look good.  Cholesterol levels look much better.  Total cholesterol is down to 109 with LDL of 41.  This is outstanding = Repatha at work!!.  Glenetta Hew, MD

## 2020-07-27 IMAGING — MR MR HEAD WO/W CM
11 series · 48 of 48 positions shown · IV contrast (multihance)
Comparison: 09/18/2017

CLINICAL DATA: Headaches, imbalance, and weakness. Syncopal
episodes 1 month ago. History of small cell lung cancer status post
whole brain radiation therapy [DATE]-[DATE].

EXAM:
MRI HEAD WITHOUT AND WITH CONTRAST
TECHNIQUE: Multiplanar, multiecho pulse sequences of the brain and surrounding
structures were obtained without and with intravenous contrast.
CONTRAST:  10mL MULTIHANCE GADOBENATE DIMEGLUMINE 529 MG/ML IV SOLN

[Series 2: FLAIR · sagittal · 3.0mm · 0.75mm/px · 2 of 39 slices shown (1 of 2)]
[im 1/39]
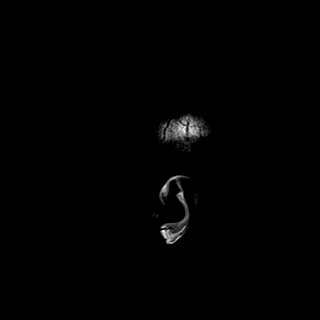
[im 39/39]
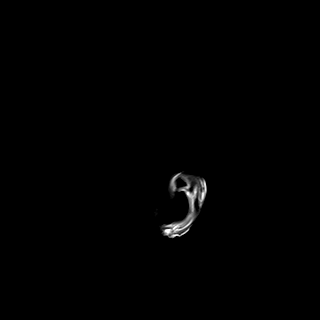

[Series 3: DWI · axial · 3.0mm · 1.50mm/px · z∈[-74,+74]mm · 5 of 78 slices shown (1 of 2)]
[im 1/78]
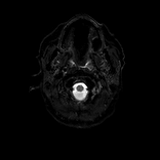
[im 20/78]
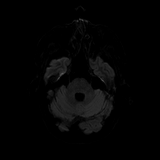
[im 39/78]
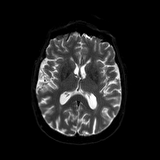
[im 58/78]
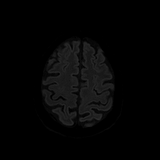
[im 78/78]
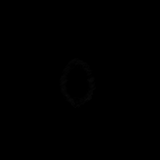

[Series 4: DWI · axial · 3.0mm · 1.50mm/px · z∈[-74,+74]mm · 3 of 39 slices shown (2 of 2)]
[im 1/39]
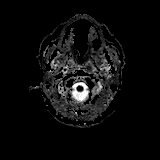
[im 20/39]
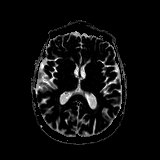
[im 39/39]
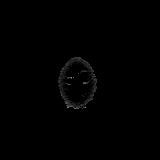

[Series 5: T2 · axial · 5.0mm · 0.57mm/px · z∈[-78,+78]mm · 2 of 27 slices shown]
[im 1/27]
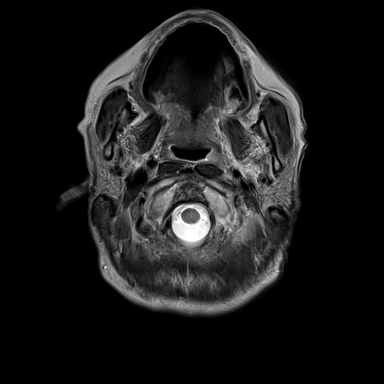
[im 27/27]
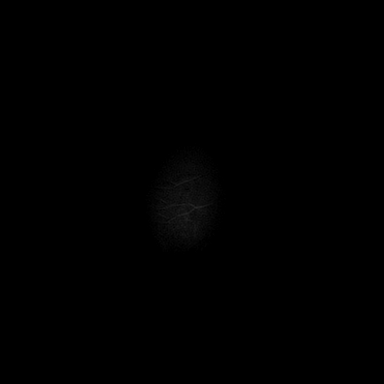

[Series 6: GRE · axial · 5.0mm · 0.57mm/px · z∈[-78,+78]mm · 2 of 27 slices shown]
[im 1/27]
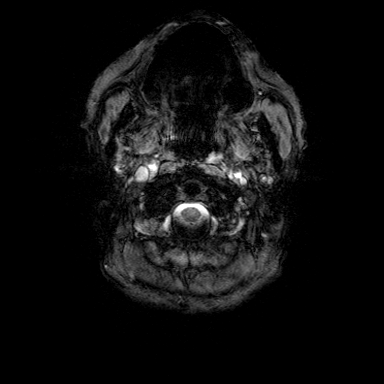
[im 27/27]
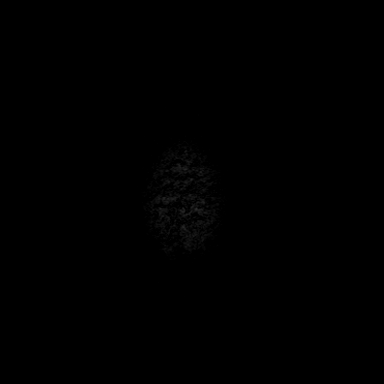

[Series 7: FLAIR · axial · 3.0mm · 0.57mm/px · z∈[-70,+83]mm · 3 of 52 slices shown (2 of 2)]
[im 1/52]
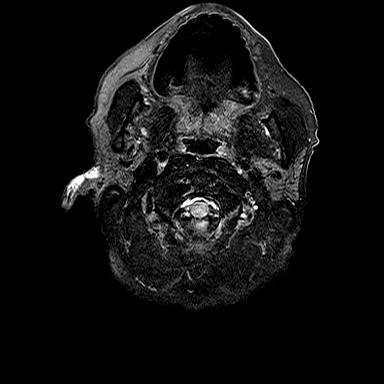
[im 26/52]
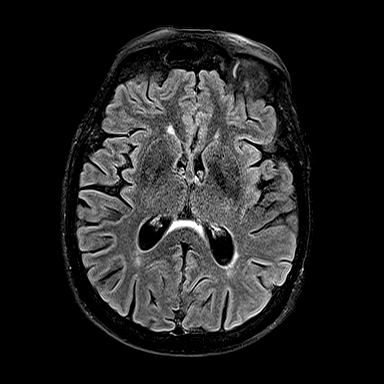
[im 52/52]
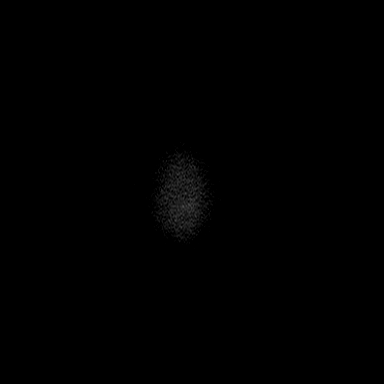

[Series 8: T1 · axial · 1.0mm · 0.75mm/px · z∈[-73,+86]mm · 11 of 160 slices shown]
[im 1/160]
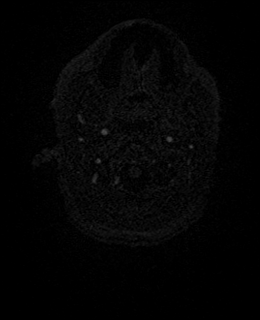
[im 16/160]
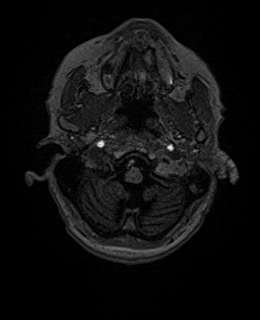
[im 32/160]
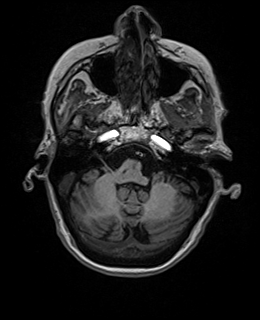
[im 48/160]
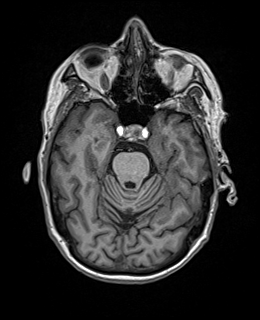
[im 64/160]
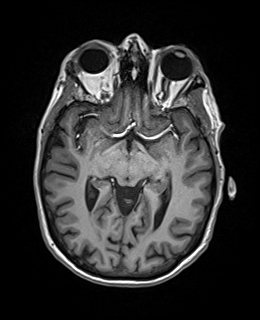
[im 80/160]
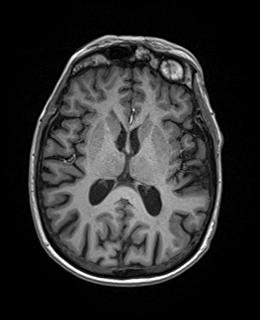
[im 96/160]
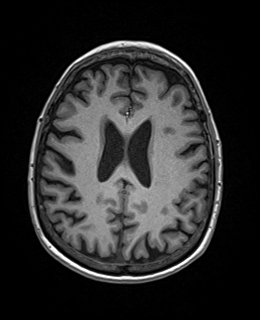
[im 112/160]
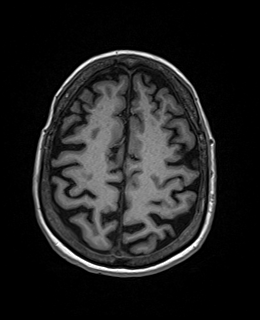
[im 128/160]
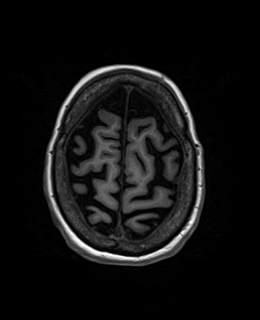
[im 144/160]
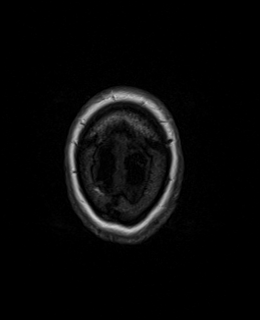
[im 160/160]
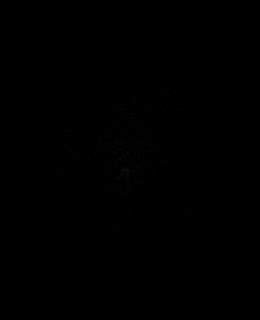

[Series 9: T2 post-contrast · coronal · 3.0mm · 0.57mm/px · 3 of 47 slices shown]
[im 1/47]
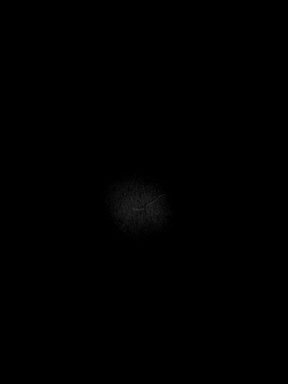
[im 24/47]
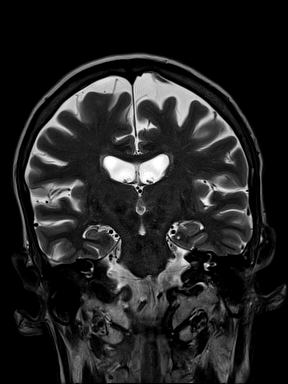
[im 47/47]
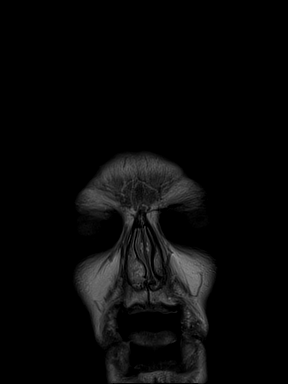

[Series 10: T1 post-contrast · axial · 1.0mm · 0.75mm/px · z∈[-73,+86]mm · 11 of 160 slices shown (1 of 2)]
[im 1/160]
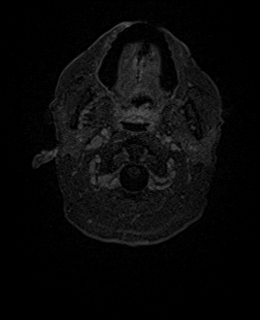
[im 16/160]
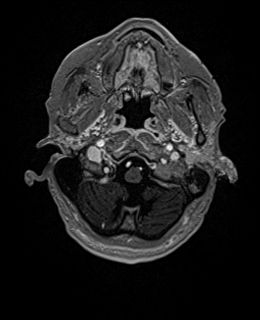
[im 32/160]
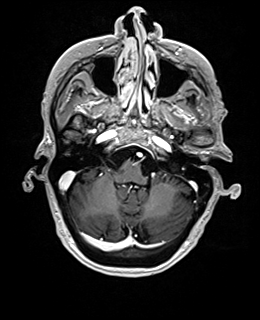
[im 48/160]
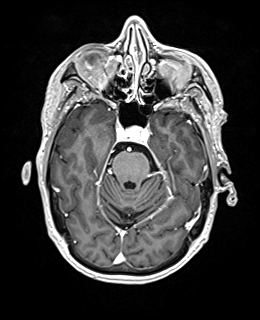
[im 64/160]
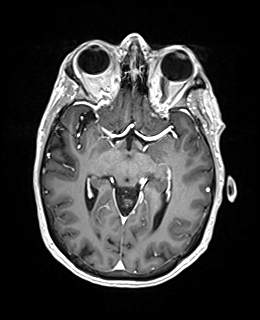
[im 80/160]
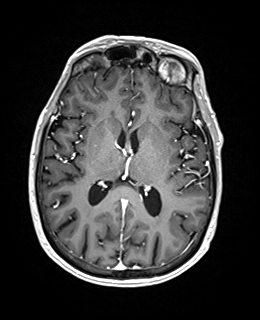
[im 96/160]
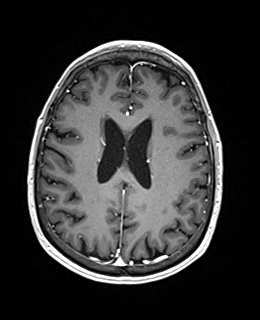
[im 112/160]
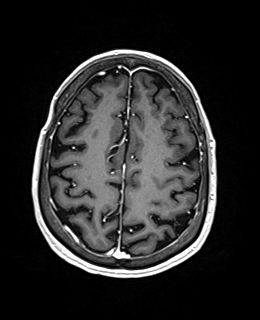
[im 128/160]
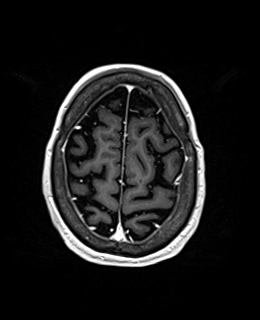
[im 144/160]
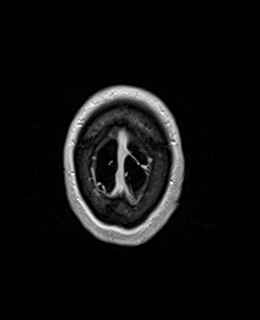
[im 160/160]
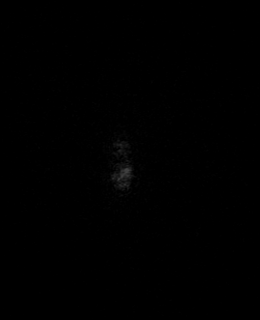

[Series 11: T1 post-contrast · coronal · 3.0mm · 0.57mm/px · 3 of 47 slices shown (2 of 2)]
[im 1/47]
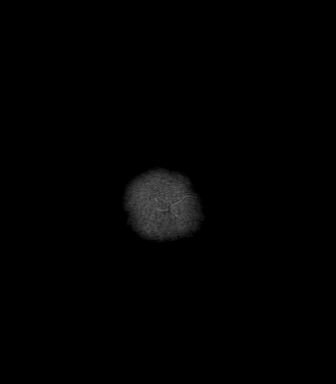
[im 24/47]
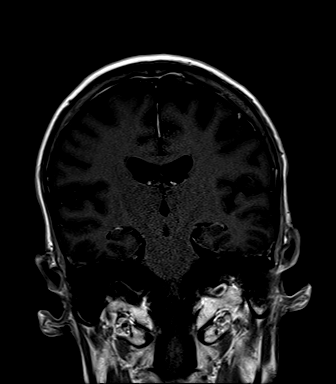
[im 47/47]
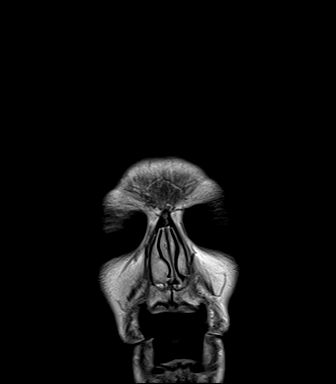

[Series 12: FLAIR post-contrast · sagittal · 3.0mm · 0.75mm/px · 3 of 39 slices shown]
[im 1/39]
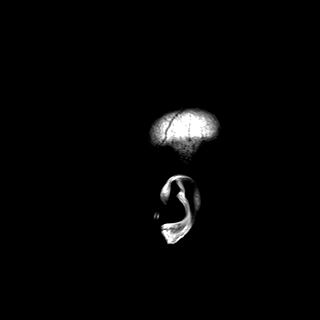
[im 20/39]
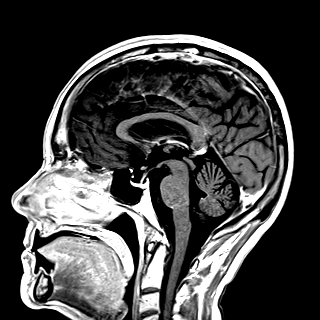
[im 39/39]
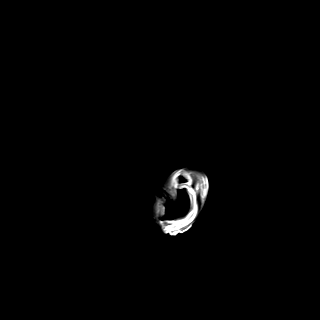

[48 of 48 positions shown; findings below may reference images not displayed]

FINDINGS: Brain: There is no evidence of acute infarct, intracranial
hemorrhage, mass, midline shift, or extra-axial fluid collection.
The ventricles and sulci are within normal limits for age. Scattered
cerebral white matter T2 hyperintensities are unchanged, mild for
age, and nonspecific though may reflect a combination of
postradiation sequelae and chronic small vessel ischemia. A
partially empty sella is again noted. No abnormal enhancement is
identified.

Vascular: Major intracranial vascular flow voids are preserved.

Skull and upper cervical spine: No suspicious marrow lesion.

Sinuses/Orbits: Unremarkable orbits. Trace left mastoid effusion.
Clear paranasal sinuses.

Other: None.
IMPRESSION: Unchanged appearance of the brain without evidence of metastatic
disease or acute abnormality.

## 2020-07-31 ENCOUNTER — Other Ambulatory Visit: Payer: Self-pay | Admitting: Cardiology

## 2020-08-01 ENCOUNTER — Other Ambulatory Visit: Payer: Self-pay

## 2020-08-01 DIAGNOSIS — E785 Hyperlipidemia, unspecified: Secondary | ICD-10-CM

## 2020-08-04 DIAGNOSIS — E785 Hyperlipidemia, unspecified: Secondary | ICD-10-CM | POA: Diagnosis not present

## 2020-08-04 LAB — HEPATIC FUNCTION PANEL
ALT: 12 IU/L (ref 0–32)
AST: 16 IU/L (ref 0–40)
Albumin: 4.4 g/dL (ref 3.8–4.8)
Alkaline Phosphatase: 68 IU/L (ref 44–121)
Bilirubin Total: 0.4 mg/dL (ref 0.0–1.2)
Bilirubin, Direct: 0.15 mg/dL (ref 0.00–0.40)
Total Protein: 6.2 g/dL (ref 6.0–8.5)

## 2020-08-04 LAB — LIPID PANEL
Chol/HDL Ratio: 2.2 ratio (ref 0.0–4.4)
Cholesterol, Total: 109 mg/dL (ref 100–199)
HDL: 50 mg/dL (ref >39)
LDL Chol Calc (NIH): 42 mg/dL (ref 0–99)
Triglycerides: 88 mg/dL (ref 0–149)
VLDL Cholesterol Cal: 17 mg/dL (ref 5–40)

## 2020-08-04 IMAGING — DX DG CHEST 2V
2 series · 2 of 2 positions shown · non-contrast
Comparison: 03/16/2018

CLINICAL DATA: Cough, shortness of breath and chest pain for 2
weeks.

EXAM:
CHEST - 2 VIEW

[chest pa]
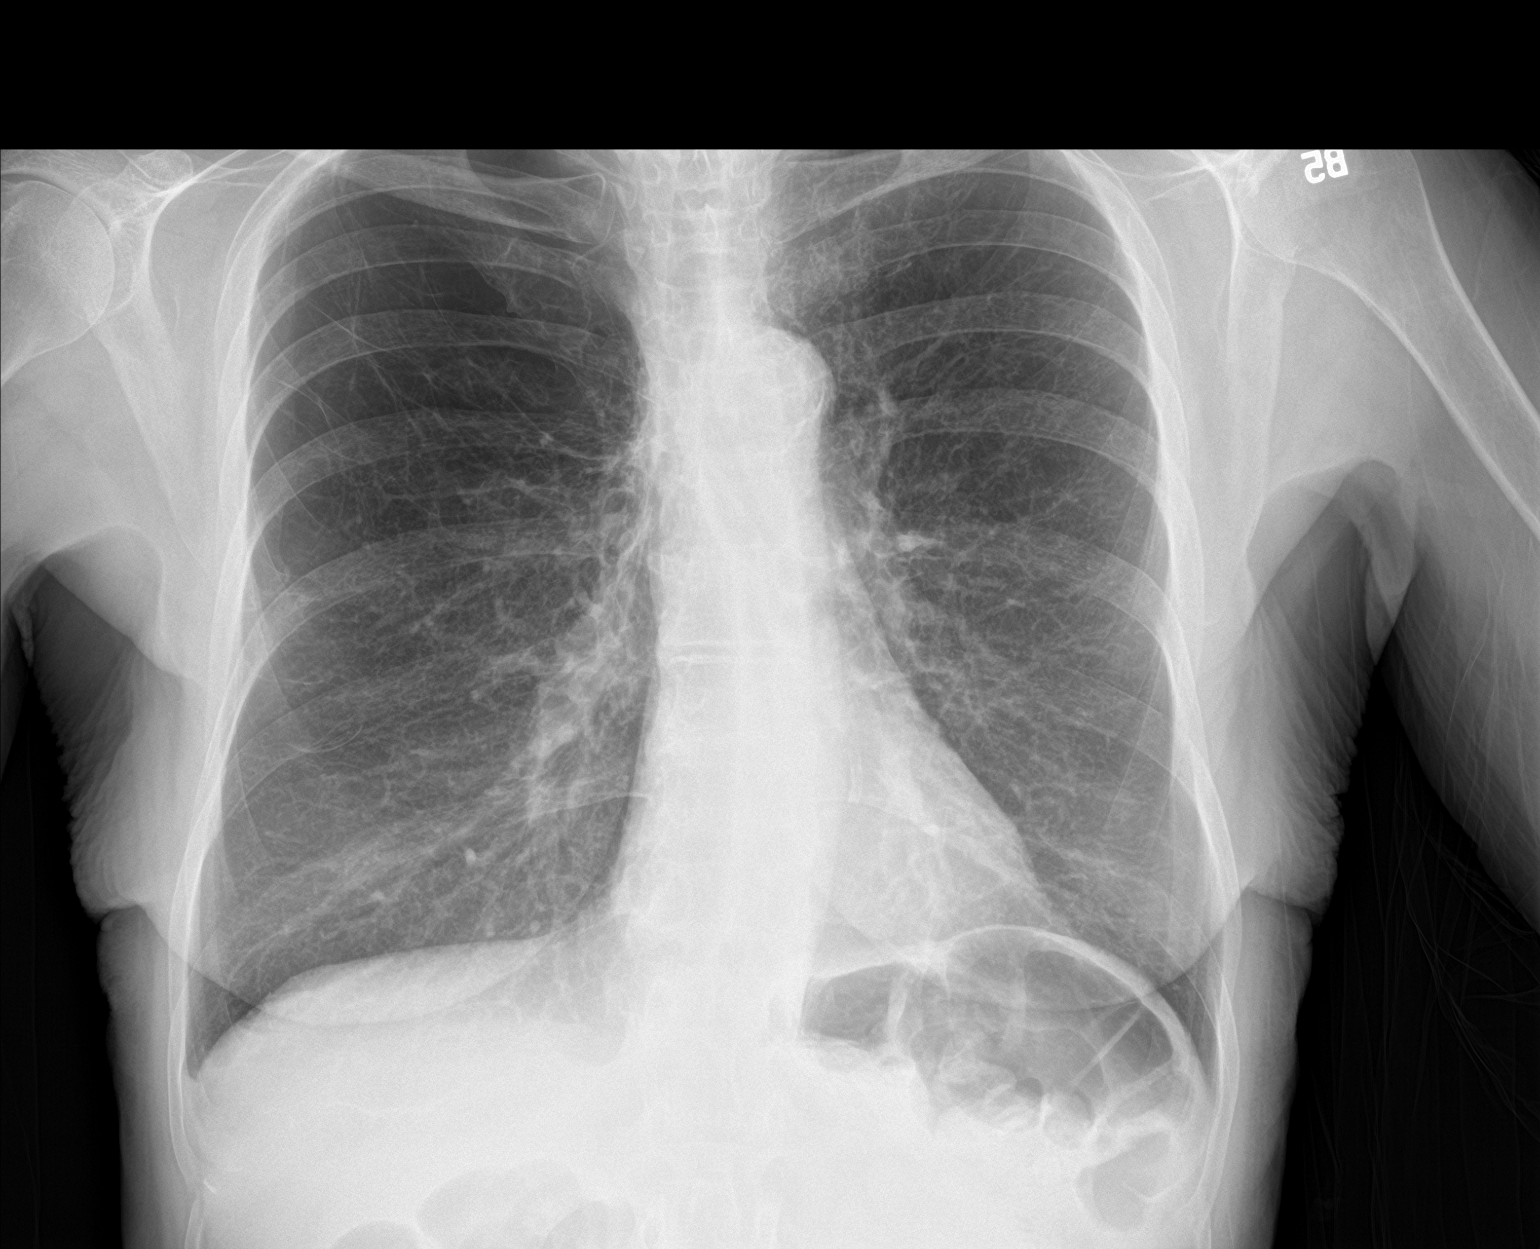

[chest lat]
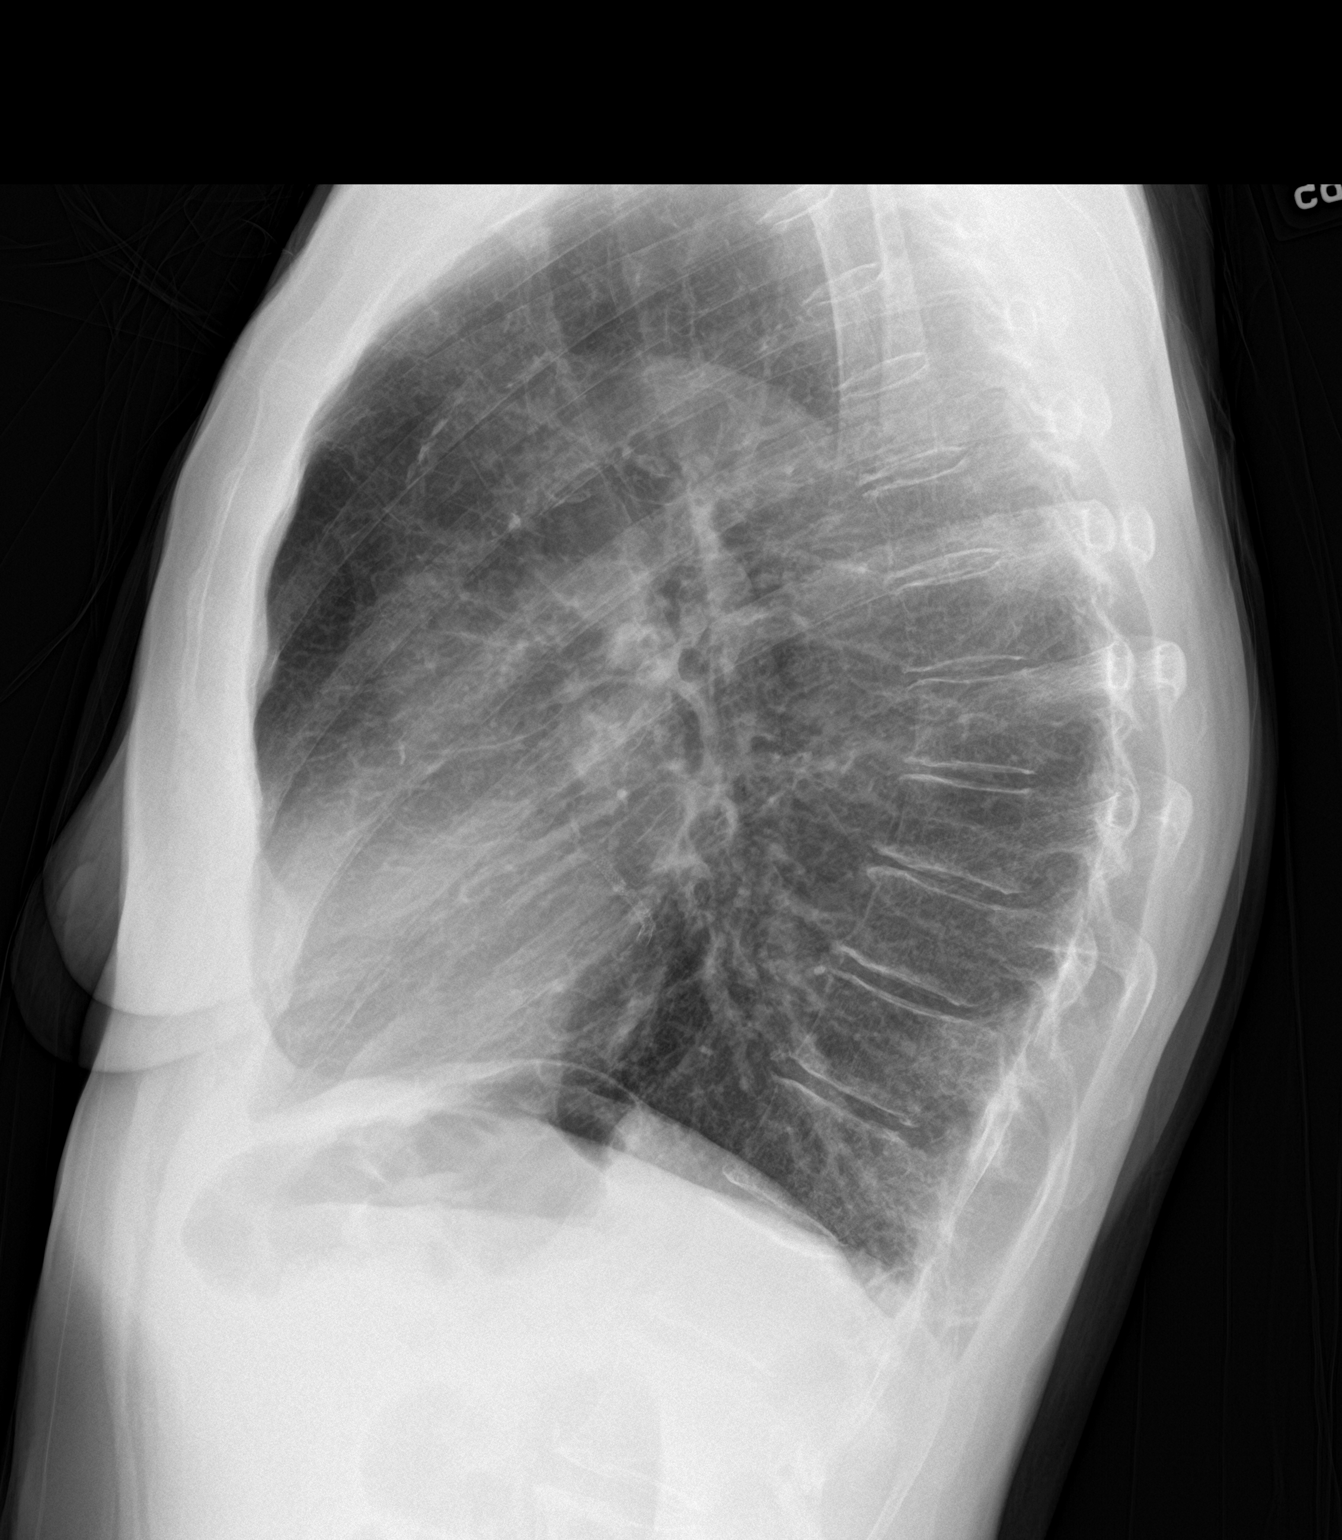

[2 of 2 positions shown; findings below may reference images not displayed]

FINDINGS: The cardiomediastinal silhouette is unremarkable.

COPD/emphysema again noted.

There is no evidence of focal airspace disease, pulmonary edema,
suspicious pulmonary nodule/mass, pleural effusion, or pneumothorax.

No acute bony abnormalities are identified.
IMPRESSION: No evidence of acute cardiopulmonary disease.

Emphysema ([id number].W).

## 2020-08-05 LAB — HEMOGLOBIN A1C
Est. average glucose Bld gHb Est-mCnc: 108 mg/dL
Hgb A1c MFr Bld: 5.4 % (ref 4.8–5.6)

## 2020-08-09 ENCOUNTER — Ambulatory Visit (INDEPENDENT_AMBULATORY_CARE_PROVIDER_SITE_OTHER): Payer: Medicare HMO | Admitting: Cardiology

## 2020-08-09 ENCOUNTER — Encounter: Payer: Self-pay | Admitting: Cardiology

## 2020-08-09 ENCOUNTER — Other Ambulatory Visit: Payer: Self-pay

## 2020-08-09 VITALS — BP 136/74 | HR 80 | Ht 60.5 in | Wt 133.0 lb

## 2020-08-09 DIAGNOSIS — E46 Unspecified protein-calorie malnutrition: Secondary | ICD-10-CM | POA: Diagnosis not present

## 2020-08-09 DIAGNOSIS — I2510 Atherosclerotic heart disease of native coronary artery without angina pectoris: Secondary | ICD-10-CM | POA: Diagnosis not present

## 2020-08-09 DIAGNOSIS — Z720 Tobacco use: Secondary | ICD-10-CM | POA: Diagnosis not present

## 2020-08-09 DIAGNOSIS — Z9861 Coronary angioplasty status: Secondary | ICD-10-CM

## 2020-08-09 DIAGNOSIS — E785 Hyperlipidemia, unspecified: Secondary | ICD-10-CM | POA: Diagnosis not present

## 2020-08-09 DIAGNOSIS — R002 Palpitations: Secondary | ICD-10-CM

## 2020-08-09 DIAGNOSIS — I2119 ST elevation (STEMI) myocardial infarction involving other coronary artery of inferior wall: Secondary | ICD-10-CM | POA: Diagnosis not present

## 2020-08-09 DIAGNOSIS — M6289 Other specified disorders of muscle: Secondary | ICD-10-CM | POA: Diagnosis not present

## 2020-08-09 NOTE — Patient Instructions (Signed)
Medication Instructions:  No changes   *If you need a refill on your cardiac medications before your next appointment, please call your pharmacy*  Other Instructions   Eat and drink  Water and protein shakes   eat as much protein as you can    Lab Work:  Not needed   Testing/Procedures:  Not needed  Follow-Up: At [facility], you and your health needs are our priority.  As part of our continuing mission to provide you with exceptional heart care, we have created designated Provider Care Teams.  These Care Teams include your primary Cardiologist (physician) and Advanced Practice Providers (APPs -  Physician Assistants and Nurse Practitioners) who all work together to provide you with the care you need, when you need it.     Your next appointment:   9 month(s) Dec 2022  The format for your next appointment:   In Person  Provider:   Glenetta Hew, MD
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
## 2020-08-16 ENCOUNTER — Other Ambulatory Visit: Payer: Self-pay

## 2020-08-16 ENCOUNTER — Encounter (HOSPITAL_COMMUNITY): Payer: Self-pay

## 2020-08-16 ENCOUNTER — Emergency Department (HOSPITAL_COMMUNITY)
Admission: EM | Admit: 2020-08-16 | Discharge: 2020-08-16 | Disposition: A | Discharge status: Home / Self Care | Payer: Medicare HMO | Attending: Emergency Medicine | Admitting: Emergency Medicine

## 2020-08-16 DIAGNOSIS — R319 Hematuria, unspecified: Secondary | ICD-10-CM | POA: Insufficient documentation

## 2020-08-16 DIAGNOSIS — M549 Dorsalgia, unspecified: Secondary | ICD-10-CM | POA: Diagnosis not present

## 2020-08-16 DIAGNOSIS — Z5321 Procedure and treatment not carried out due to patient leaving prior to being seen by health care provider: Secondary | ICD-10-CM | POA: Insufficient documentation

## 2020-08-16 DIAGNOSIS — R42 Dizziness and giddiness: Secondary | ICD-10-CM | POA: Diagnosis not present

## 2020-08-16 DIAGNOSIS — I10 Essential (primary) hypertension: Secondary | ICD-10-CM | POA: Diagnosis not present

## 2020-08-16 DIAGNOSIS — M545 Low back pain, unspecified: Secondary | ICD-10-CM | POA: Diagnosis not present

## 2020-08-16 LAB — URINALYSIS, ROUTINE W REFLEX MICROSCOPIC
Bilirubin Urine: NEGATIVE
Glucose, UA: NEGATIVE mg/dL
Ketones, ur: NEGATIVE mg/dL
Leukocytes,Ua: NEGATIVE
Nitrite: NEGATIVE
Protein, ur: NEGATIVE mg/dL
Specific Gravity, Urine: 1.003 — ABNORMAL LOW (ref 1.005–1.030)
pH: 6 (ref 5.0–8.0)

## 2020-08-16 NOTE — ED Triage Notes (Signed)
Pt arrives EMS after large amounts of hematuria with blood clots. Recurrent UTI.

## 2020-08-16 NOTE — ED Notes (Signed)
Pt returned identification labels and eloped from waiting area prior to room placement.

## 2020-08-18 DIAGNOSIS — R300 Dysuria: Secondary | ICD-10-CM | POA: Diagnosis not present

## 2020-08-18 DIAGNOSIS — R8271 Bacteriuria: Secondary | ICD-10-CM | POA: Diagnosis not present

## 2020-08-18 DIAGNOSIS — R310 Gross hematuria: Secondary | ICD-10-CM | POA: Diagnosis not present

## 2020-08-21 ENCOUNTER — Telehealth: Payer: Self-pay | Admitting: Cardiology

## 2020-08-21 NOTE — Telephone Encounter (Signed)
Will route to PharmD to review if okay.   Thank you!

## 2020-08-21 NOTE — Telephone Encounter (Signed)
Pt c/o medication issue:  1. Name of Medication: Muscle Milk  2. How are you currently taking this medication (dosage and times per day)? Has not started  3. Are you having a reaction (difficulty breathing--STAT)? no  4. What is your medication issue? Patient states she was told to start taking a protein supplement. She states all she found was Muscle milk. She states 1 serving has 45 mg of sodium. She would like to know if this is okay.

## 2020-08-22 NOTE — Telephone Encounter (Signed)
Patients was returning phone call.

## 2020-08-22 NOTE — Telephone Encounter (Signed)
Called patient, LVM advising of message from PharmD.  Advised patient to call back if questions.

## 2020-08-22 NOTE — Telephone Encounter (Signed)
We will recommend AGAINST using Muscle Milk as protein supplement. Looks like Dr [name] recommended protein shakes and increased hydration.  Some of the options include EVOLE , Ensure Max Protein, ICONIC, OWYN.  Can increase protein by adding protein powder to drinks or food. Eating lean protein like fish, chicken, veal, etc also recommended.

## 2020-08-22 NOTE — Telephone Encounter (Signed)
Pt updated and verbalized understanding.  

## 2020-08-22 NOTE — Telephone Encounter (Signed)
Unsure who told her to drink protein supplements or if she really needs them, but the Na content of muscle milk is ok. Would watch the amount of sugar in the protein drinks.

## 2020-08-22 NOTE — Telephone Encounter (Addendum)
Pt advised of Pharm D's recommendation regarding muscle milk protein but pt state her questioning was related to the amount of cholesterol. She state she is currently on repatha to help decrease cholesterol numbers and the muscle milk has 45 mg of total cholesterol.   Will forward back to Pharm D for recommendations

## 2020-08-24 IMAGING — CT CT ANGIO CHEST
2 of 6 series · 19 of 46 positions shown · IV contrast (APPLIED)
Comparison: Chest radiograph 04/22/2018

CLINICAL DATA: Elevated D-dimer.  History of bronchitis.

EXAM:
CT ANGIOGRAPHY CHEST WITH CONTRAST
TECHNIQUE: Multidetector CT imaging of the chest was performed using the
standard protocol during bolus administration of intravenous
contrast. Multiplanar CT image reconstructions and MIPs were
obtained to evaluate the vascular anatomy.
CONTRAST:  100mL 7BBYZ5-P4W IOPAMIDOL (7BBYZ5-P4W) INJECTION 76%

[Series 5: thins · axial · 0.64mm/px · z∈[-278,-26]mm · 17 of 276 slices shown]
[im 12/276  lung]
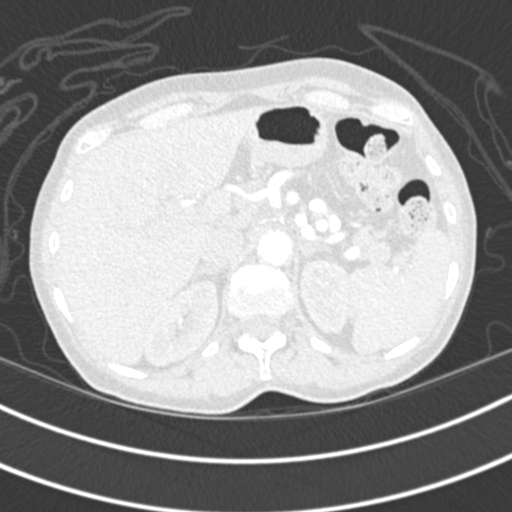
[im 24/276  soft-tissue]
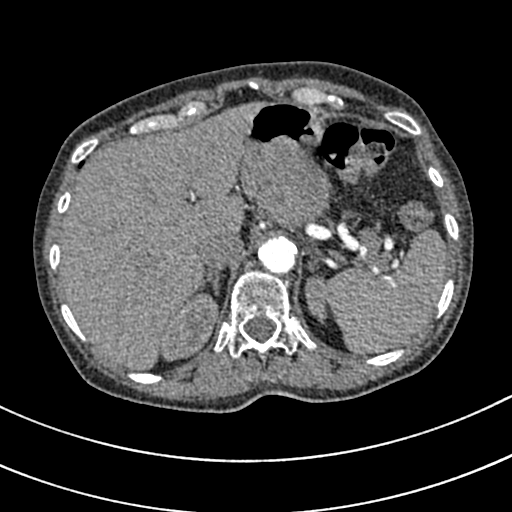
[im 48/276  lung]
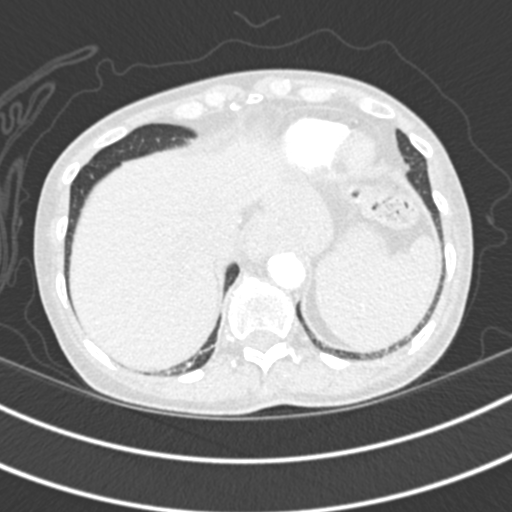
[im 60/276  soft-tissue]
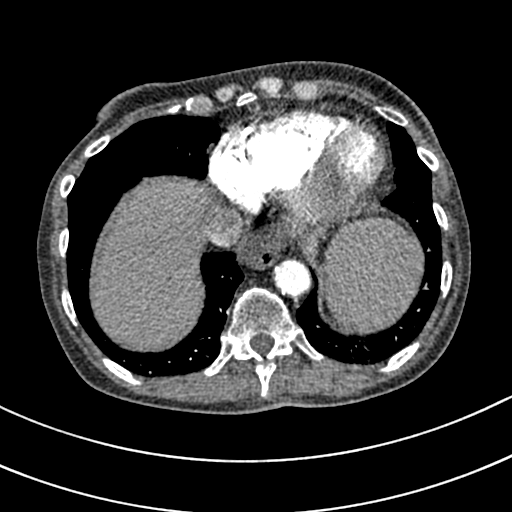
[im 72/276  lung]
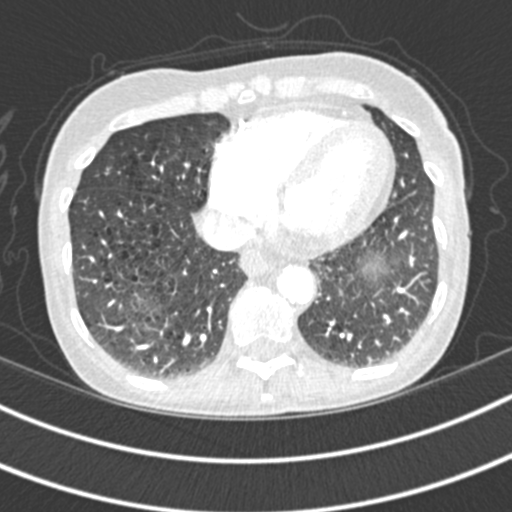
[im 96/276  soft-tissue]
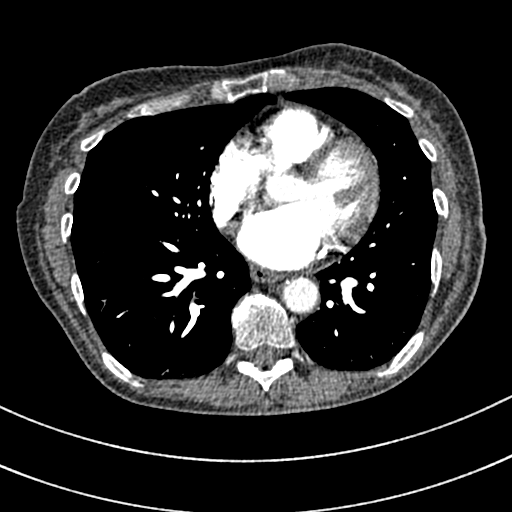
[im 108/276  lung]
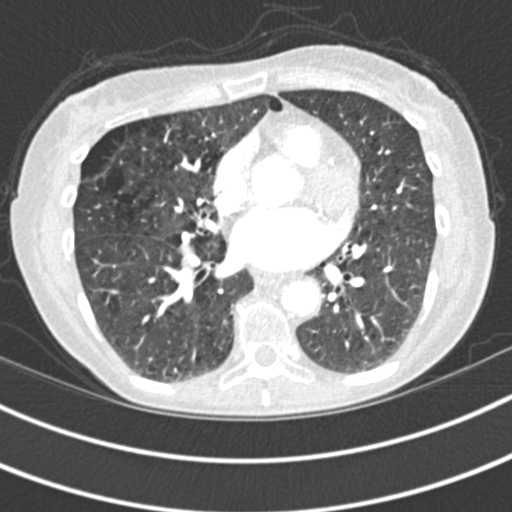
[im 120/276  soft-tissue]
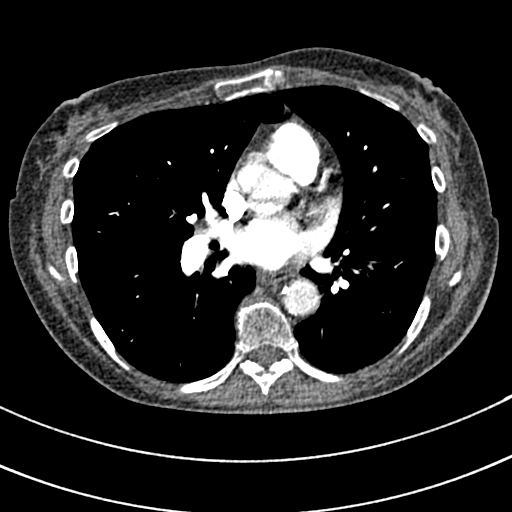
[im 144/276  lung]
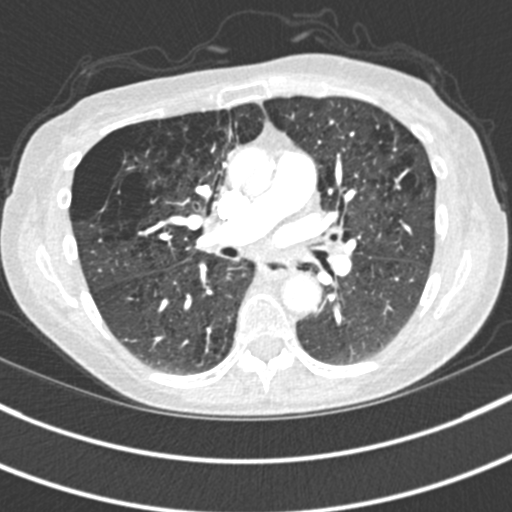
[im 156/276  soft-tissue]
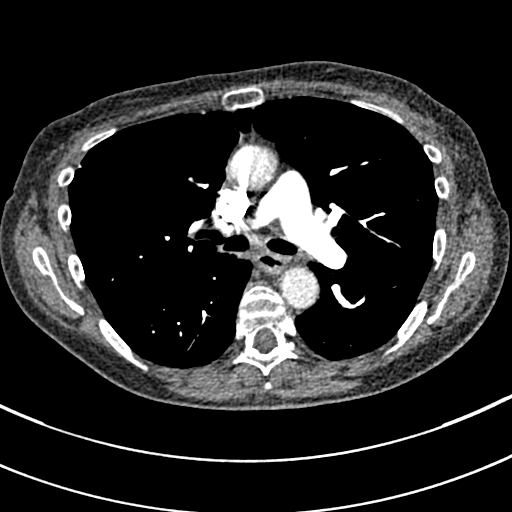
[im 168/276  lung]
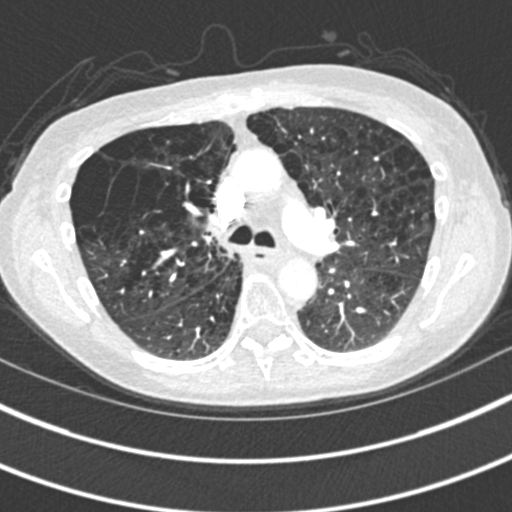
[im 180/276  soft-tissue]
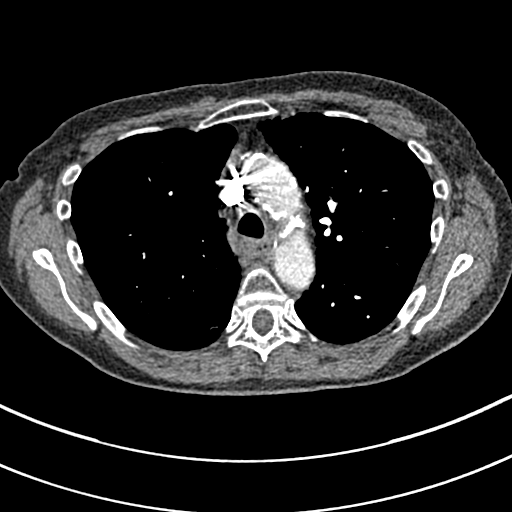
[im 204/276  lung]
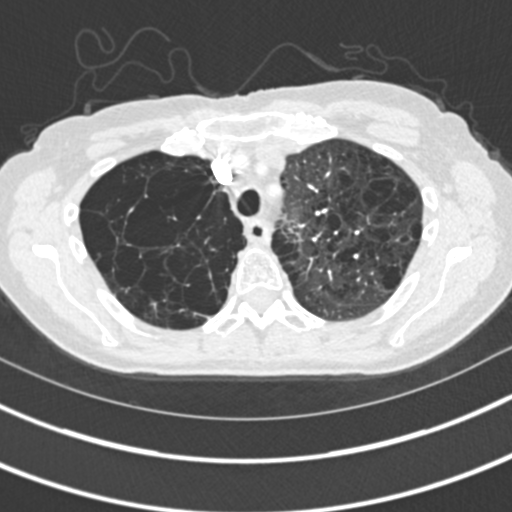
[im 216/276  soft-tissue]
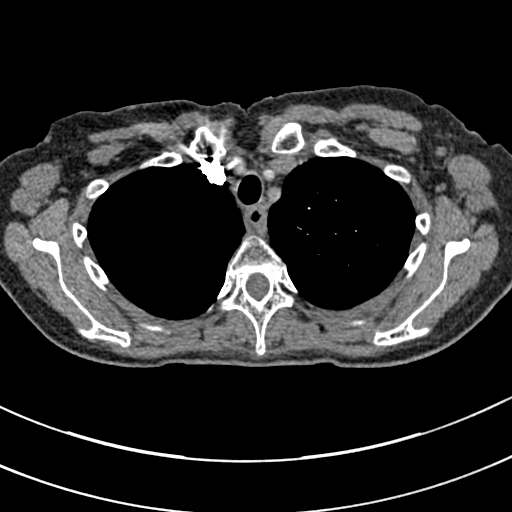
[im 228/276  lung]
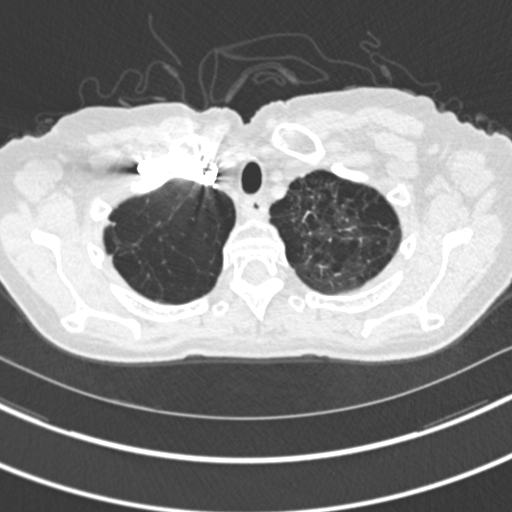
[im 252/276  soft-tissue]
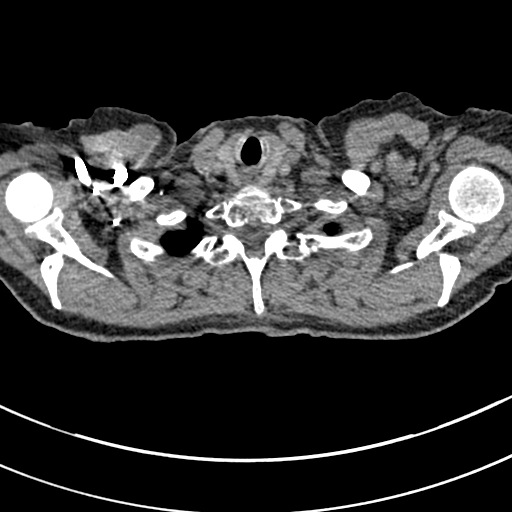
[im 264/276  lung]
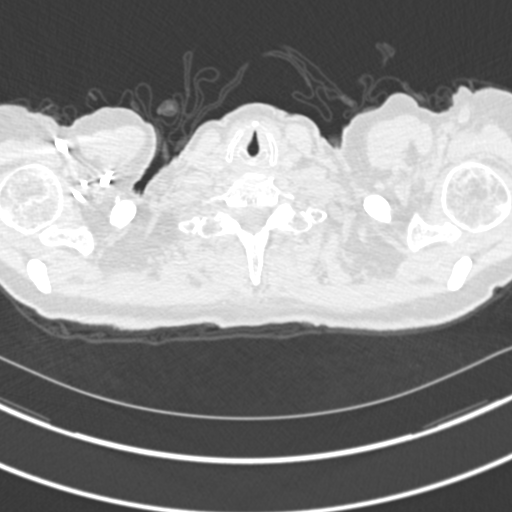

[Series 7: coronal mpr · coronal · 0.59mm/px · 2 of 73 slices shown]
[im 25/73  soft-tissue]
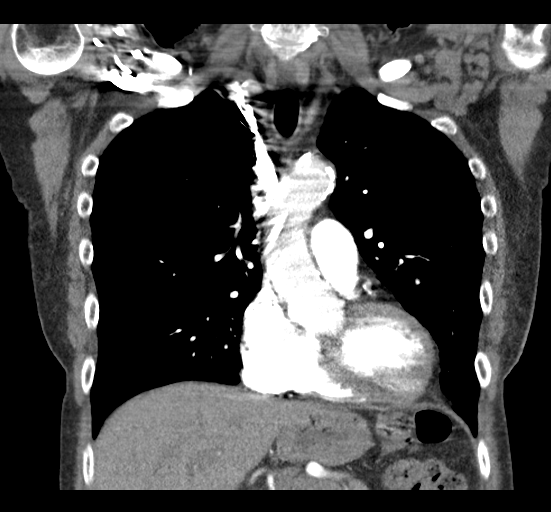
[im 49/73  soft-tissue]
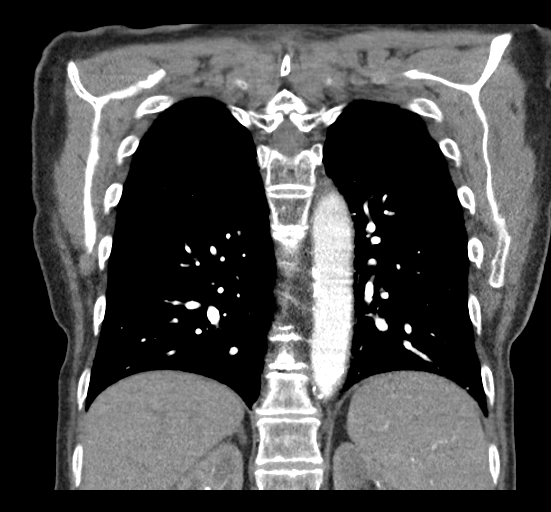

[19 of 46 positions shown; findings below may reference images not displayed]

FINDINGS: Cardiovascular: Satisfactory opacification of the pulmonary arteries
to the segmental level. No evidence of pulmonary embolism. Normal
heart size. No pericardial effusion. Calcific atherosclerotic
disease of the coronary arteries and aorta.

Mediastinum/Nodes: No evidence of lymphadenopathy. Small to moderate
hiatal hernia. Diffuse dilation of the esophagus with associated
mild mucosal wall thickening.

Lungs/Pleura: Severe upper lobe predominant emphysematous changes
with large bulla formation in the subpleural lung of the bilateral
upper lobes and right middle lobe. Chronic bronchiectasis. No
evidence of airspace consolidation or lung masses.

Upper Abdomen: No acute abnormality.

Musculoskeletal: Exaggerated thoracic kyphosis.  Spondylosis.

Review of the MIP images confirms the above findings.
IMPRESSION: No evidence of pulmonary embolus.

Marked upper lobe predominance emphysema with bullous degeneration
of the lung parenchyma in the upper lobes and right middle lobe.

Mild diffuse dilation and mucosal thickening of the esophagus. Small
hiatal hernia. Please correlate to findings of upper endoscopy.

Aortic Atherosclerosis (2NUFH-7Y2.2) and Emphysema (2NUFH-S52.O).

## 2020-08-29 ENCOUNTER — Other Ambulatory Visit: Payer: Self-pay

## 2020-08-29 ENCOUNTER — Encounter: Payer: Self-pay | Admitting: Gastroenterology

## 2020-08-29 ENCOUNTER — Ambulatory Visit (INDEPENDENT_AMBULATORY_CARE_PROVIDER_SITE_OTHER): Payer: Medicare HMO | Admitting: Gastroenterology

## 2020-08-29 VITALS — BP 140/80 | HR 59 | Ht 65.0 in | Wt 134.0 lb

## 2020-08-29 DIAGNOSIS — R109 Unspecified abdominal pain: Secondary | ICD-10-CM | POA: Diagnosis not present

## 2020-08-29 DIAGNOSIS — R1310 Dysphagia, unspecified: Secondary | ICD-10-CM | POA: Diagnosis not present

## 2020-08-29 MED ORDER — FLUCONAZOLE 100 MG PO TABS: 100.0000 mg | ORAL_TABLET | Freq: Every day | ORAL | 0 refills | Status: AC

## 2020-08-29 NOTE — Patient Instructions (Signed)
We have sent the following medications to your pharmacy for you to pick up at your convenience: Diflucan.
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
## 2020-09-01 ENCOUNTER — Telehealth: Payer: Self-pay | Admitting: Gastroenterology

## 2020-09-01 DIAGNOSIS — R1031 Right lower quadrant pain: Secondary | ICD-10-CM | POA: Diagnosis not present

## 2020-09-01 DIAGNOSIS — Z124 Encounter for screening for malignant neoplasm of cervix: Secondary | ICD-10-CM | POA: Diagnosis not present

## 2020-09-01 NOTE — Telephone Encounter (Signed)
Patient called states there are something's in her file that are not correct and would like to discuss with a nurse.

## 2020-09-01 NOTE — Telephone Encounter (Signed)
Left message on machine to call back  

## 2020-09-04 NOTE — Telephone Encounter (Signed)
Left message on machine to call back will send a message to My Chart as well.

## 2020-09-08 ENCOUNTER — Encounter: Payer: Self-pay | Admitting: Cardiology

## 2020-09-08 ENCOUNTER — Telehealth: Payer: Self-pay | Admitting: Pulmonary Disease

## 2020-09-08 MED ORDER — DOXYCYCLINE HYCLATE 100 MG PO TABS: 100.0000 mg | ORAL_TABLET | Freq: Two times a day (BID) | ORAL | 0 refills | Status: DC

## 2020-09-08 MED ORDER — PREDNISONE 20 MG PO TABS: 40.0000 mg | ORAL_TABLET | Freq: Every day | ORAL | 0 refills | Status: DC

## 2020-09-08 NOTE — Assessment & Plan Note (Signed)
She has now started PCSK9 inhibitor.  Lipids on most recent check look great. Continue to follow-up in [facility] lipid clinic.  Remains on Vascepa and pravastatin intermittently.

## 2020-09-08 NOTE — Telephone Encounter (Signed)
Doxycycline 100 mg twice daily for 7 days Prednisone 40 mg a day for 5 days  Call back if specks of blood in the mucus persist

## 2020-09-08 NOTE — Assessment & Plan Note (Signed)
Dopplers were normal.

## 2020-09-08 NOTE — Assessment & Plan Note (Signed)
This is been evaluated with multiple different studies.  Were not planning to do any more monitors.  We discussed that she may need to invest in a [employer].

## 2020-09-08 NOTE — Telephone Encounter (Signed)
Called spoke with patient.  She states she has been coughing constantly.  When she does get up mucus it has specs of blood in it sometimes. She states that her left lung is hurting her quite a bit as well. She is taking xopenex daily. along with her arnuity and spiriva handihaler Not taking the flonase. She takes mucinex but it makes her cough worse and keeps her up at night. She is increasing weakness.   Will send to Dr. Vaughan Browner for recommendations.

## 2020-09-08 NOTE — Assessment & Plan Note (Signed)
She has not made any inroads in quitting.  I do not see that this may very doable without some type of medication assistance, and she is still reluctant to try medicines. I Chantix was the only medicine that seemed to help.  We talked again about getting nicotine patches.

## 2020-09-08 NOTE — Assessment & Plan Note (Signed)
History of multiple MIs.  Now based occluded circumflex system.  Now unexpectedly diarrhea Vascepa if not).  No heart failure symptoms.  She has chronic episodic chest pain probably microvascular.  Has had infarct noted on Myoview, but no significant

## 2020-09-08 NOTE — Assessment & Plan Note (Signed)
Extensive CAD with PCI to the RCA and LCx.  Now the distal LCx is occluded diffuse disease in the RCA.  She does have chronic "stable angina ", but nonischemic Myoview evaluations on several occasions.  Plan: Lifelong Plavix, and low-dose Imdur which she seems to tolerate. She is not on beta-blocker or ARB because of prior hypotension.  She remains on Vascepa, pravastatin 3 days a week along with Repatha -> recently started.

## 2020-09-08 NOTE — Assessment & Plan Note (Signed)
Continue encourage increased protein intake and adequate hydration to avoid orthostatic hypotension.

## 2020-09-08 NOTE — Telephone Encounter (Signed)
Called and spoke with pt letting her know the info stated by Dr. Vaughan Browner about recs and she verbalized understanding. Rx for prednisone and doxy have been sent to pharmacy for pt. Pt was also due for an appt so appt has been scheduled for her with [facility] Thursday 4/14. Nothing further needed.

## 2020-09-11 ENCOUNTER — Telehealth: Payer: Self-pay | Admitting: Pulmonary Disease

## 2020-09-11 DIAGNOSIS — R071 Chest pain on breathing: Secondary | ICD-10-CM

## 2020-09-11 NOTE — Telephone Encounter (Signed)
Called and spoke with patient. She verbalized understanding. She stated that she is not taking the prednisone because it has a history of causing heart palpations. She will continue taking the antibiotics for now. She is ok with having the CT scan. She wishes to have the CT tomorrow.   Order has been placed.   Nothing further needed at time of call.

## 2020-09-11 NOTE — Telephone Encounter (Signed)
Continue abx and steriod. Please order CTA re: hemoptysis. If she is coughing up more than 240cc of bright red blood, back pain worsens or O2 level <90% needs stat ED evaluation.

## 2020-09-11 NOTE — Telephone Encounter (Signed)
Called and spoke with patient. She stated that since starting the doxy and prednisone on Friday that Dr. Vaughan Browner sent in on Friday, she has been coughing up more blood. She is now experiencing upper back pain as well as some chest pain after she coughs. She stated that her SOB has remained the same and she was concerned about her O2 dropping to 95%. I went into detail with her about normal oxygen levels. She is also feeling more weak.   She has an appt on 09/14/20 with [facility] but feels she needs to be seen sooner. I did look at the schedule for today but Beth nor Judson Roch had any openings. VS is out of the office today.   Beth, do you have any recommendations for her? Thanks!

## 2020-09-12 ENCOUNTER — Ambulatory Visit (INDEPENDENT_AMBULATORY_CARE_PROVIDER_SITE_OTHER): Payer: Medicare HMO

## 2020-09-12 ENCOUNTER — Telehealth: Payer: Self-pay | Admitting: Primary Care

## 2020-09-12 ENCOUNTER — Other Ambulatory Visit: Payer: Medicare HMO

## 2020-09-12 DIAGNOSIS — R042 Hemoptysis: Secondary | ICD-10-CM

## 2020-09-12 DIAGNOSIS — J439 Emphysema, unspecified: Secondary | ICD-10-CM

## 2020-09-12 DIAGNOSIS — R071 Chest pain on breathing: Secondary | ICD-10-CM

## 2020-09-12 DIAGNOSIS — R079 Chest pain, unspecified: Secondary | ICD-10-CM | POA: Diagnosis not present

## 2020-09-12 LAB — D-DIMER, QUANTITATIVE: D-Dimer, Quant: 0.54 mcg/mL FEU — ABNORMAL HIGH (ref <0.50)

## 2020-09-12 NOTE — Telephone Encounter (Signed)
Beth- please see below per [facility] Are you okay with ordering the cxr and d dimer?

## 2020-09-12 NOTE — Telephone Encounter (Signed)
yes

## 2020-09-12 NOTE — Progress Notes (Signed)
Please let patient know CXR showed no acute abnormality. COPD chronic findings. Will follow-up on d-dimer

## 2020-09-12 NOTE — Telephone Encounter (Signed)
Spoke with the pt and notified orders for lab and cxr placed and she is coming now to get them

## 2020-09-12 NOTE — Progress Notes (Signed)
D-dimer was only very mildly elevated. If she is still having hemoptysis please order CTA, otherwise will see her on 4/14

## 2020-09-12 NOTE — Telephone Encounter (Signed)
CT angio was ordered yesterday and pt wanted to go today.  I had her scheduled at [place] for walk-in appt today.  [facility] tried to get precerted but [facility] denied stating pt needs a chest x-ray and d-dimer before it can be approved.  I called pt & made her aware and told her someone from our office would call and let her know what she needs to do.

## 2020-09-13 ENCOUNTER — Telehealth: Payer: Self-pay | Admitting: Primary Care

## 2020-09-13 ENCOUNTER — Ambulatory Visit
Admission: RE | Admit: 2020-09-13 | Discharge: 2020-09-13 | Disposition: A | Discharge status: Home / Self Care | Payer: Medicare HMO | Source: Ambulatory Visit | Attending: Primary Care | Admitting: Primary Care

## 2020-09-13 DIAGNOSIS — J432 Centrilobular emphysema: Secondary | ICD-10-CM | POA: Diagnosis not present

## 2020-09-13 DIAGNOSIS — R071 Chest pain on breathing: Secondary | ICD-10-CM

## 2020-09-13 DIAGNOSIS — K449 Diaphragmatic hernia without obstruction or gangrene: Secondary | ICD-10-CM | POA: Diagnosis not present

## 2020-09-13 DIAGNOSIS — R918 Other nonspecific abnormal finding of lung field: Secondary | ICD-10-CM | POA: Diagnosis not present

## 2020-09-13 DIAGNOSIS — I700 Atherosclerosis of aorta: Secondary | ICD-10-CM | POA: Diagnosis not present

## 2020-09-13 MED ORDER — IOPAMIDOL (ISOVUE-370) INJECTION 76%
75.0000 mL | Freq: Once | INTRAVENOUS | Status: AC | PRN
  Administered 2020-09-13: 75 mL via INTRAVENOUS

## 2020-09-13 NOTE — Telephone Encounter (Signed)
CTA was negative for PE, no acute abnormality. She has emphysema. We told her husband she would go over results during her apt in the morning.

## 2020-09-13 NOTE — Telephone Encounter (Signed)
Called and spoke with pt letting her know the results of the CTa and stated to her that Eustaquio Maize will go over results during [place] tomorrow 4/14. Pt verbalized understanding. Nothing further needed.
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
## 2020-09-13 NOTE — Telephone Encounter (Signed)
Pt was advised of the time frame for the provider reviewing the results.  She did see these results in mychart and she got upset.  She is wanting to have someone call her about these results.  EW please advise. Thanks

## 2020-09-14 ENCOUNTER — Encounter: Payer: Self-pay | Admitting: Primary Care

## 2020-09-14 ENCOUNTER — Ambulatory Visit (INDEPENDENT_AMBULATORY_CARE_PROVIDER_SITE_OTHER): Payer: Medicare HMO | Admitting: Primary Care

## 2020-09-14 ENCOUNTER — Other Ambulatory Visit: Payer: Self-pay

## 2020-09-14 DIAGNOSIS — J439 Emphysema, unspecified: Secondary | ICD-10-CM

## 2020-09-14 DIAGNOSIS — J471 Bronchiectasis with (acute) exacerbation: Secondary | ICD-10-CM | POA: Diagnosis not present

## 2020-09-14 DIAGNOSIS — F17210 Nicotine dependence, cigarettes, uncomplicated: Secondary | ICD-10-CM

## 2020-09-14 MED ORDER — NYSTATIN 100000 UNIT/ML MT SUSP: 5.0000 mL | Freq: Four times a day (QID) | OROMUCOSAL | 0 refills | Status: DC

## 2020-09-14 MED ORDER — DOXYCYCLINE HYCLATE 100 MG PO TABS: 100.0000 mg | ORAL_TABLET | Freq: Two times a day (BID) | ORAL | 0 refills | Status: DC

## 2020-09-14 MED ORDER — FLUTICASONE PROPIONATE 50 MCG/ACT NA SUSP: NASAL | 3 refills | Status: AC

## 2020-09-14 NOTE — Assessment & Plan Note (Addendum)
-   Patient developed productive cough with hemoptysis last week. Prescribed Doxycycline 1 tab x 7 days. She has one day left of abx, cough has improved. CTA negative for PE or pneumonia/lung nodules. Lungs clear on exam. O2 98% RA. We will extended Doxycycline additional 3 days. Encourage patient use flutter valve twice a day. FU in 3-6 months with Dr. Halford Chessman or sooner if needed.

## 2020-09-14 NOTE — Assessment & Plan Note (Addendum)
-   COPD symptoms do not currently appear exacerbated. She has extensive emphysema on imaging. Patient reports inconsistent use of Spiriva and Arnuity. Encouraged daily use. She is not able to take prednisone d/t reported adverse side effects. She is still smoking, strongly encourage she quit. Smoking cessation reviewed at length >5 mins.

## 2020-09-14 NOTE — Patient Instructions (Signed)
CTA imaging did not show evidence of pneumonia, lung nodule/mass or blood clot  Recommendations: - Continue Arnuity and Spiriva (try your best to take EVERY day) - You NEED to start to try and quit smoking (cut down amount and pick a quit date) - Extending doxycycline additional three days - Adding prednisone to allergy list due to intolerance - Use flutter valve twice a day as much as possible - You can take over the counter Delsym for cough - You can take 25-50mg  benadryl at night to help you sleep   Follow-up - Dr. Halford Chessman in 3-6 months

## 2020-09-18 DIAGNOSIS — N390 Urinary tract infection, site not specified: Secondary | ICD-10-CM | POA: Diagnosis not present

## 2020-09-18 DIAGNOSIS — I2510 Atherosclerotic heart disease of native coronary artery without angina pectoris: Secondary | ICD-10-CM | POA: Diagnosis not present

## 2020-09-18 DIAGNOSIS — K5909 Other constipation: Secondary | ICD-10-CM | POA: Diagnosis not present

## 2020-09-18 DIAGNOSIS — F17200 Nicotine dependence, unspecified, uncomplicated: Secondary | ICD-10-CM | POA: Diagnosis not present

## 2020-09-18 DIAGNOSIS — E7800 Pure hypercholesterolemia, unspecified: Secondary | ICD-10-CM | POA: Diagnosis not present

## 2020-09-18 DIAGNOSIS — R7303 Prediabetes: Secondary | ICD-10-CM | POA: Diagnosis not present

## 2020-09-18 DIAGNOSIS — J439 Emphysema, unspecified: Secondary | ICD-10-CM | POA: Diagnosis not present

## 2020-09-18 DIAGNOSIS — C3491 Malignant neoplasm of unspecified part of right bronchus or lung: Secondary | ICD-10-CM | POA: Diagnosis not present

## 2020-09-18 NOTE — Telephone Encounter (Signed)
Patient states the muscle milk is a protein powder. She states she received a voicemail stating the sodium content was okay, but she states did not ask about the sodium she asked about the cholesterol. Informed patient per note below she was recommended against using the Muscle Milk, she would like to know why and if the level of cholesterol is okay.

## 2020-09-18 NOTE — Telephone Encounter (Signed)
Are you aware of cholesterol intake for muscle milk??   Patient states she was not asking in regards to sodium intake.   Thanks!

## 2020-09-18 NOTE — Progress Notes (Signed)
Reviewed and agree with assessment/plan.   [name], MD [facility] Pulmonary/Critical Care 09/18/2020, 8:27 AM Pager:  [phone]

## 2020-09-19 ENCOUNTER — Telehealth: Payer: Self-pay | Admitting: Primary Care

## 2020-09-19 NOTE — Telephone Encounter (Signed)
Called patient, advised of the message from PharmD.  Patient verbalized understanding.

## 2020-09-19 NOTE — Telephone Encounter (Signed)
Please see previous recommendation.   We already  recommend AGAINST using MUSCLE MILK and recommendations of other products privided.  Patient was already taking MUSCLE MILK, so it was instructed to finish what she had at home, then try to use any of the other recommeded products ,if possible.  MUSCLE MILK has more fat and more sugar than brands. Any of the other reccomended products provide similar amount of protein, less fat (plant based), and less sugars.

## 2020-09-19 NOTE — Telephone Encounter (Signed)
She can take an over the counter probiotic and imodium as needed if continuous to have diarrhea. Stay well hydrate and may benefit from [place] for a couple of days.

## 2020-09-19 NOTE — Telephone Encounter (Signed)
Spoke with pt, states she finished her last dose of doxycycline last night, and has experienced diarrhea last night and this morning after taking the medication. Pt disclosed that she took the medication on an empty stomach.  I advised that this is a common side effect when taking abx on an empty stomach.   Pt denies any fever, nausea, vomiting. I advised pt to stay well hydrated. Pt also advised that she is taking a probiotic.   Beth please advise if you have any additional recommendations. Thanks!

## 2020-09-19 NOTE — Telephone Encounter (Signed)
ATC LVMTCB x 1  

## 2020-09-19 NOTE — Telephone Encounter (Signed)
I have attempted to call the pt again to advise her of what BW stated. I did leave a VM for her to make her aware of the immodium that she can use and the BRAT diet to follow.  I advised the pt if she has any questions or concerns, to call our office back.  Nothing further is needed.

## 2020-09-20 ENCOUNTER — Telehealth: Payer: Self-pay

## 2020-09-20 NOTE — Telephone Encounter (Addendum)
(  Dr. Mickeal Skinner) Pt called stating she has been having headaches with a roaring sound in her head for months and continues to feels off balance.   (Dr. Julien Nordmann) Pt also c/o abdominal pain and describes them as cramps in her sides and in the center of her abdomen. Pt denies a fever.   (Both providers) Pt is requesting a full body PET scan.

## 2020-09-22 ENCOUNTER — Inpatient Hospital Stay: Payer: Medicare HMO | Attending: Physician Assistant

## 2020-09-22 ENCOUNTER — Other Ambulatory Visit: Payer: Self-pay

## 2020-09-22 ENCOUNTER — Telehealth: Payer: Self-pay | Admitting: Primary Care

## 2020-09-22 ENCOUNTER — Ambulatory Visit (HOSPITAL_COMMUNITY)
Admission: RE | Admit: 2020-09-22 | Discharge: 2020-09-22 | Disposition: A | Discharge status: Home / Self Care | Payer: Medicare HMO | Source: Ambulatory Visit | Attending: Internal Medicine | Admitting: Internal Medicine

## 2020-09-22 DIAGNOSIS — N644 Mastodynia: Secondary | ICD-10-CM | POA: Insufficient documentation

## 2020-09-22 DIAGNOSIS — R109 Unspecified abdominal pain: Secondary | ICD-10-CM | POA: Insufficient documentation

## 2020-09-22 DIAGNOSIS — H9319 Tinnitus, unspecified ear: Secondary | ICD-10-CM | POA: Diagnosis not present

## 2020-09-22 DIAGNOSIS — K449 Diaphragmatic hernia without obstruction or gangrene: Secondary | ICD-10-CM | POA: Diagnosis not present

## 2020-09-22 DIAGNOSIS — J449 Chronic obstructive pulmonary disease, unspecified: Secondary | ICD-10-CM | POA: Insufficient documentation

## 2020-09-22 DIAGNOSIS — F17290 Nicotine dependence, other tobacco product, uncomplicated: Secondary | ICD-10-CM | POA: Diagnosis not present

## 2020-09-22 DIAGNOSIS — R269 Unspecified abnormalities of gait and mobility: Secondary | ICD-10-CM | POA: Insufficient documentation

## 2020-09-22 DIAGNOSIS — I2510 Atherosclerotic heart disease of native coronary artery without angina pectoris: Secondary | ICD-10-CM | POA: Diagnosis not present

## 2020-09-22 DIAGNOSIS — Z85118 Personal history of other malignant neoplasm of bronchus and lung: Secondary | ICD-10-CM | POA: Diagnosis not present

## 2020-09-22 DIAGNOSIS — R519 Headache, unspecified: Secondary | ICD-10-CM | POA: Diagnosis not present

## 2020-09-22 DIAGNOSIS — C3491 Malignant neoplasm of unspecified part of right bronchus or lung: Secondary | ICD-10-CM

## 2020-09-22 DIAGNOSIS — R531 Weakness: Secondary | ICD-10-CM | POA: Insufficient documentation

## 2020-09-22 DIAGNOSIS — C3490 Malignant neoplasm of unspecified part of unspecified bronchus or lung: Secondary | ICD-10-CM | POA: Diagnosis not present

## 2020-09-22 DIAGNOSIS — R5383 Other fatigue: Secondary | ICD-10-CM | POA: Insufficient documentation

## 2020-09-22 DIAGNOSIS — J984 Other disorders of lung: Secondary | ICD-10-CM | POA: Diagnosis not present

## 2020-09-22 LAB — CBC WITH DIFFERENTIAL (CANCER CENTER ONLY)
Abs Immature Granulocytes: 0.01 10*3/uL (ref 0.00–0.07)
Basophils Absolute: 0 10*3/uL (ref 0.0–0.1)
Basophils Relative: 0 %
Eosinophils Absolute: 0 10*3/uL (ref 0.0–0.5)
Eosinophils Relative: 1 %
HCT: 42.2 % (ref 36.0–46.0)
Hemoglobin: 13.9 g/dL (ref 12.0–15.0)
Immature Granulocytes: 0 %
Lymphocytes Relative: 9 %
Lymphs Abs: 0.7 10*3/uL (ref 0.7–4.0)
MCH: 30.7 pg (ref 26.0–34.0)
MCHC: 32.9 g/dL (ref 30.0–36.0)
MCV: 93.2 fL (ref 80.0–100.0)
Monocytes Absolute: 0.4 10*3/uL (ref 0.1–1.0)
Monocytes Relative: 5 %
Neutro Abs: 6.4 10*3/uL (ref 1.7–7.7)
Neutrophils Relative %: 85 %
Platelet Count: 187 10*3/uL (ref 150–400)
RBC: 4.53 MIL/uL (ref 3.87–5.11)
RDW: 13.5 % (ref 11.5–15.5)
WBC Count: 7.5 10*3/uL (ref 4.0–10.5)
nRBC: 0 % (ref 0.0–0.2)

## 2020-09-22 LAB — CMP (CANCER CENTER ONLY)
ALT: 10 U/L (ref 0–44)
AST: 18 U/L (ref 15–41)
Albumin: 4.3 g/dL (ref 3.5–5.0)
Alkaline Phosphatase: 66 U/L (ref 38–126)
Anion gap: 9 (ref 5–15)
BUN: 12 mg/dL (ref 8–23)
CO2: 27 mmol/L (ref 22–32)
Calcium: 9.9 mg/dL (ref 8.9–10.3)
Chloride: 105 mmol/L (ref 98–111)
Creatinine: 0.69 mg/dL (ref 0.44–1.00)
GFR, Estimated: >60 mL/min (ref >60)
Glucose, Bld: 93 mg/dL (ref 70–99)
Potassium: 4.1 mmol/L (ref 3.5–5.1)
Sodium: 141 mmol/L (ref 135–145)
Total Bilirubin: 0.4 mg/dL (ref 0.3–1.2)
Total Protein: 6.9 g/dL (ref 6.5–8.1)

## 2020-09-22 MED ORDER — LEVALBUTEROL HCL 0.63 MG/3ML IN NEBU: 0.6300 mg | INHALATION_SOLUTION | RESPIRATORY_TRACT | 5 refills | Status: DC | PRN

## 2020-09-22 MED ORDER — IOHEXOL 300 MG/ML  SOLN
75.0000 mL | Freq: Once | INTRAMUSCULAR | Status: AC | PRN
  Administered 2020-09-22: 75 mL via INTRAVENOUS

## 2020-09-22 NOTE — Telephone Encounter (Signed)
Michele [name] regarding Levalbuterol nebulizer solution.  Staff verified that they did not receive an order on 09/14/20.  ATC patient[facility]  Advised gentleman that answered the phone to ask her to call us back regarding her prescription needing refill.  Electronic script sent.

## 2020-09-25 ENCOUNTER — Ambulatory Visit: Payer: Medicare Other | Admitting: Internal Medicine

## 2020-09-25 NOTE — Telephone Encounter (Signed)
Called and spoke with patient, verified that patient was able to get her Levalbuterol nebulizer solution filled.  Nothing further needed.
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
## 2020-09-27 ENCOUNTER — Other Ambulatory Visit: Payer: Self-pay

## 2020-09-27 ENCOUNTER — Inpatient Hospital Stay (HOSPITAL_BASED_OUTPATIENT_CLINIC_OR_DEPARTMENT_OTHER): Payer: Medicare HMO | Admitting: Physician Assistant

## 2020-09-27 VITALS — BP 150/72 | HR 82 | Temp 97.4°F | Resp 17 | Wt 135.8 lb

## 2020-09-27 DIAGNOSIS — Z85118 Personal history of other malignant neoplasm of bronchus and lung: Secondary | ICD-10-CM | POA: Diagnosis not present

## 2020-09-27 DIAGNOSIS — R269 Unspecified abnormalities of gait and mobility: Secondary | ICD-10-CM | POA: Diagnosis not present

## 2020-09-27 DIAGNOSIS — N644 Mastodynia: Secondary | ICD-10-CM | POA: Diagnosis not present

## 2020-09-27 DIAGNOSIS — R109 Unspecified abdominal pain: Secondary | ICD-10-CM | POA: Diagnosis not present

## 2020-09-27 DIAGNOSIS — C3491 Malignant neoplasm of unspecified part of right bronchus or lung: Secondary | ICD-10-CM | POA: Diagnosis not present

## 2020-09-27 DIAGNOSIS — R531 Weakness: Secondary | ICD-10-CM | POA: Diagnosis not present

## 2020-09-27 DIAGNOSIS — R519 Headache, unspecified: Secondary | ICD-10-CM | POA: Diagnosis not present

## 2020-09-27 DIAGNOSIS — R5383 Other fatigue: Secondary | ICD-10-CM | POA: Diagnosis not present

## 2020-09-27 DIAGNOSIS — H9319 Tinnitus, unspecified ear: Secondary | ICD-10-CM | POA: Diagnosis not present

## 2020-09-27 DIAGNOSIS — J449 Chronic obstructive pulmonary disease, unspecified: Secondary | ICD-10-CM | POA: Diagnosis not present

## 2020-09-29 ENCOUNTER — Telehealth: Payer: Self-pay | Admitting: Physician Assistant

## 2020-09-29 NOTE — Telephone Encounter (Signed)
Scheduled per los. Called and left msg. Mailed printout  °

## 2020-10-03 ENCOUNTER — Other Ambulatory Visit: Payer: Self-pay

## 2020-10-03 ENCOUNTER — Encounter: Payer: Self-pay | Admitting: Internal Medicine

## 2020-10-03 ENCOUNTER — Inpatient Hospital Stay: Payer: Medicare HMO | Attending: Internal Medicine | Admitting: Internal Medicine

## 2020-10-03 VITALS — BP 140/67 | HR 87 | Temp 97.9°F | Resp 19 | Ht 65.0 in | Wt 136.2 lb

## 2020-10-03 DIAGNOSIS — G629 Polyneuropathy, unspecified: Secondary | ICD-10-CM

## 2020-10-03 DIAGNOSIS — Z86010 Personal history of colonic polyps: Secondary | ICD-10-CM | POA: Insufficient documentation

## 2020-10-03 DIAGNOSIS — G44219 Episodic tension-type headache, not intractable: Secondary | ICD-10-CM | POA: Diagnosis not present

## 2020-10-03 DIAGNOSIS — Z8051 Family history of malignant neoplasm of kidney: Secondary | ICD-10-CM | POA: Insufficient documentation

## 2020-10-03 DIAGNOSIS — Z8049 Family history of malignant neoplasm of other genital organs: Secondary | ICD-10-CM | POA: Insufficient documentation

## 2020-10-03 DIAGNOSIS — F17210 Nicotine dependence, cigarettes, uncomplicated: Secondary | ICD-10-CM | POA: Diagnosis not present

## 2020-10-03 DIAGNOSIS — Z85118 Personal history of other malignant neoplasm of bronchus and lung: Secondary | ICD-10-CM | POA: Diagnosis not present

## 2020-10-03 DIAGNOSIS — Z808 Family history of malignant neoplasm of other organs or systems: Secondary | ICD-10-CM | POA: Insufficient documentation

## 2020-10-03 DIAGNOSIS — G44209 Tension-type headache, unspecified, not intractable: Secondary | ICD-10-CM | POA: Insufficient documentation
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
## 2020-10-04 ENCOUNTER — Telehealth: Payer: Self-pay | Admitting: Pulmonary Disease

## 2020-10-04 NOTE — Telephone Encounter (Signed)
ATC Patient.  LM to call back when available.  

## 2020-10-04 NOTE — Telephone Encounter (Signed)
Spoke with the pt  She states msg was in no way regarding symptoms or medication  She states she saw on her medical problems small cell lung ca and this concerned her  I advised that since she had this problem before, it is going to be included in her medical record, and that is why it is there  She verbalized understanding and nothing further needed

## 2020-10-16 DIAGNOSIS — N3020 Other chronic cystitis without hematuria: Secondary | ICD-10-CM | POA: Diagnosis not present

## 2020-10-16 DIAGNOSIS — R300 Dysuria: Secondary | ICD-10-CM | POA: Diagnosis not present

## 2020-10-16 NOTE — Telephone Encounter (Signed)
I spoke with pt and advised, though she is under observation, she does have a hx of lung cancer and this is why she will continue to see this dx. Pt expressed understanding of this information.   Pt also states she sees GI and wanted to know why she has a dx of hepatitis and kidney disease. I advised pt to follow-up with her GI provider as this is not something I could speak to. Pt states she was directed by GI to contact her PCP that placed the dx. I advised the pt to do the same.

## 2020-10-29 ENCOUNTER — Other Ambulatory Visit: Payer: Self-pay | Admitting: Cardiology

## 2020-10-31 DIAGNOSIS — M47816 Spondylosis without myelopathy or radiculopathy, lumbar region: Secondary | ICD-10-CM | POA: Diagnosis not present

## 2020-11-06 IMAGING — DX DG CHEST 2V
2 series · 2 of 2 positions shown · non-contrast
Comparison: 06/12/2018

CLINICAL DATA: Follow-up pneumonia

EXAM:
CHEST - 2 VIEW

[chest pa]
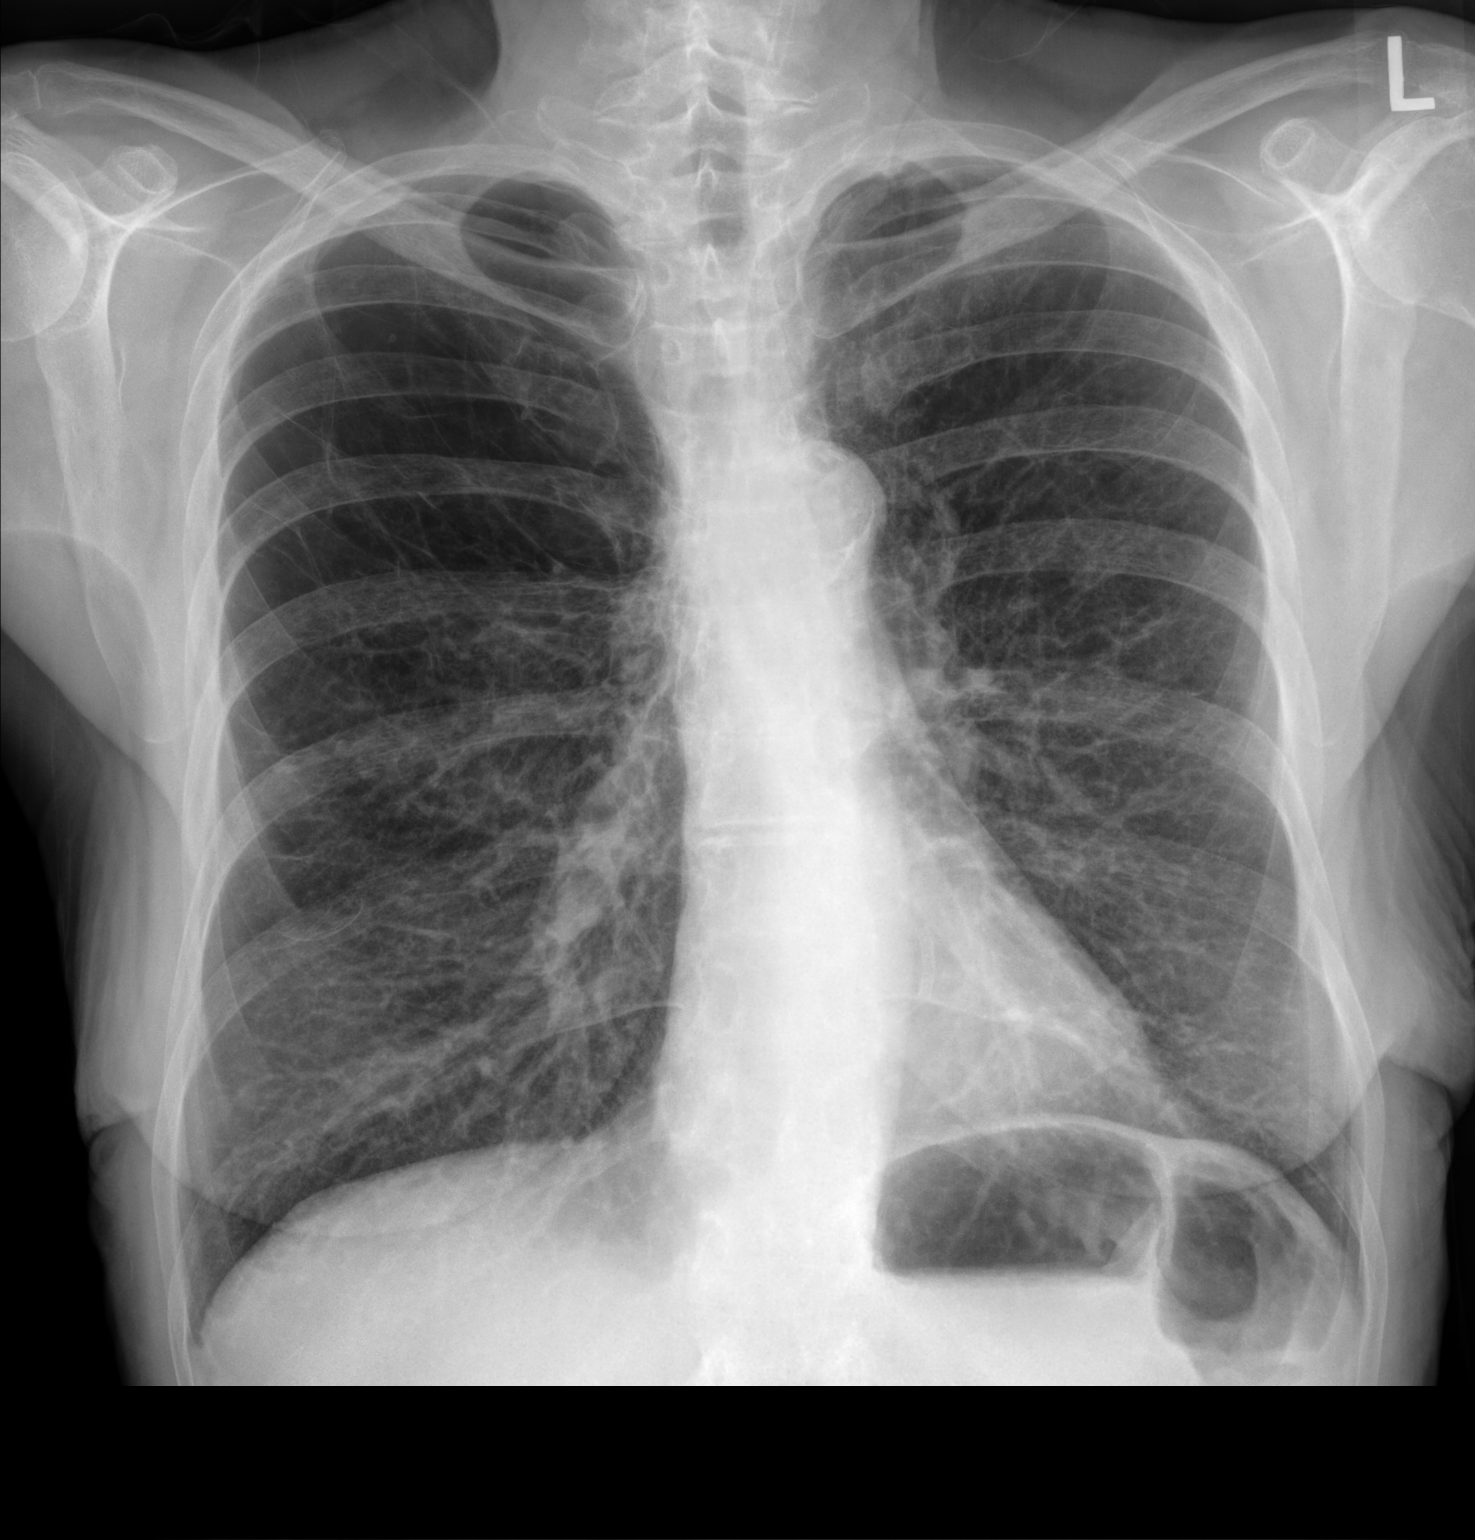

[chest lat]
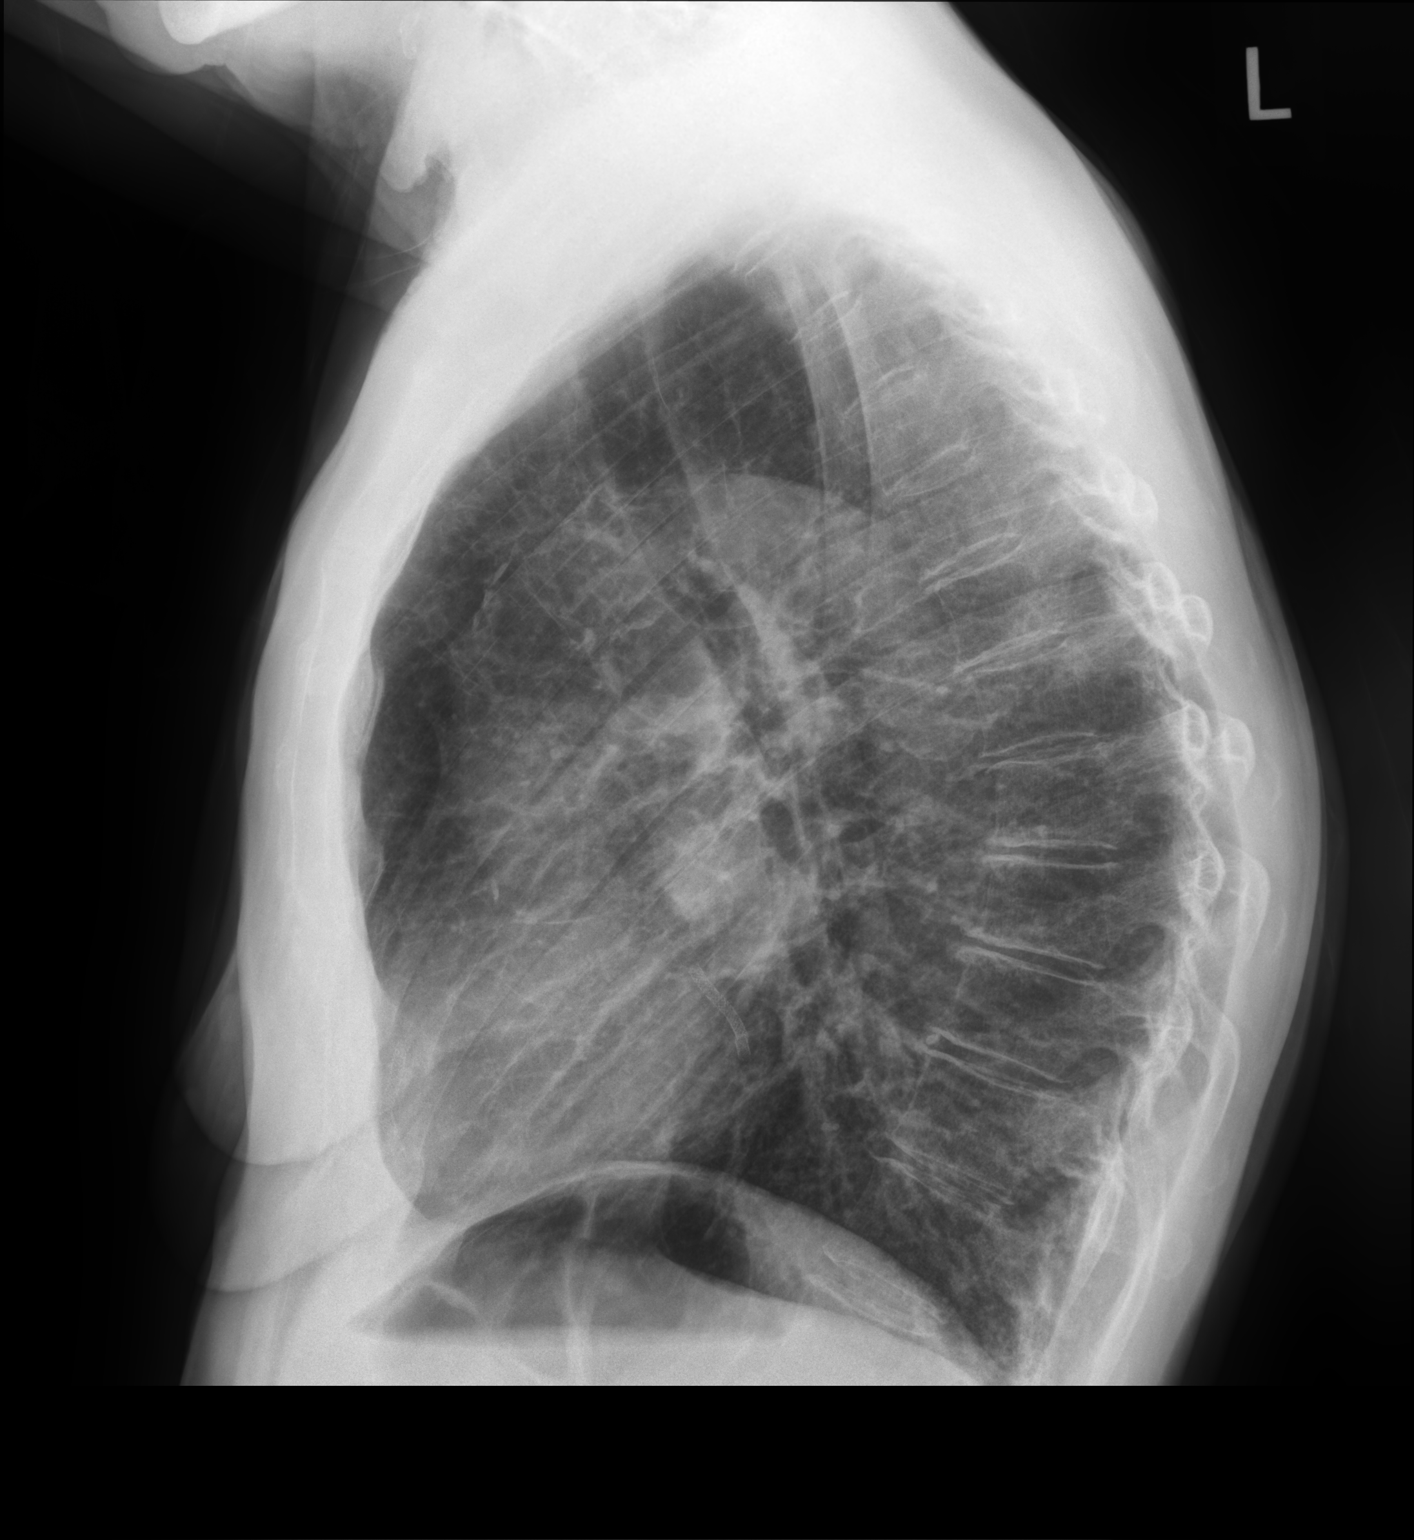

[2 of 2 positions shown; findings below may reference images not displayed]

FINDINGS: Cardiac shadow is within normal limits. Coronary stenting is again
noted. Aortic calcifications are again seen. Hyperinflation is noted
consistent with emphysematous change. No focal infiltrate or sizable
effusion is noted. No acute bony abnormality is noted.
IMPRESSION: COPD without acute abnormality. Previously seen density has resolved
in the interval.

## 2020-11-09 DIAGNOSIS — M5416 Radiculopathy, lumbar region: Secondary | ICD-10-CM | POA: Diagnosis not present

## 2020-11-09 DIAGNOSIS — I2510 Atherosclerotic heart disease of native coronary artery without angina pectoris: Secondary | ICD-10-CM | POA: Diagnosis not present

## 2020-11-09 DIAGNOSIS — D6869 Other thrombophilia: Secondary | ICD-10-CM | POA: Diagnosis not present

## 2020-11-13 ENCOUNTER — Telehealth: Payer: Self-pay | Admitting: Cardiology

## 2020-11-13 NOTE — Telephone Encounter (Signed)
   Pt c/o swelling: STAT is pt has developed SOB within 24 hours  If swelling, where is the swelling located? Both feet and legs  How much weight have you gained and in what time span? Not sure  Have you gained 3 pounds in a day or 5 pounds in a week? Not sure  Do you have a log of your daily weights (if so, list)? No   Are you currently taking a fluid pill? No  Are you currently SOB? always  Have you traveled recently? No  Pt said both of her feet and legs are swelling, sometimes it looks like they are bluish color and she said its getting painful. She is not sure about gain weight because she doesn't have weighing scale, she wanted to be seen as soon as tomorrow

## 2020-11-13 NOTE — Telephone Encounter (Addendum)
This RN called patient back, patient reports swelling that has been ongoing for 1 month. She reports painful, swollen feet/ankles/legs. She does not have any weights or blood pressures recorded at this time. Pt reports shortness of breath with occasional chest pain and headache "every day". This RN recommended patient seek emergency treatment at her nearest emergency room due to her shortness of breath. This RN was also able to get patient an appointment with Laurann Montana, NP on December 06, 2020 at 1:30pm. Patient verbalized understanding. This RN brought concerns to DOD Dr. [name], Dr. [name] agreed with plan to send patient to emergency department. This RN to forward conversation to his primary nurse Ivin Booty.

## 2020-11-16 DIAGNOSIS — M545 Low back pain, unspecified: Secondary | ICD-10-CM | POA: Diagnosis not present

## 2020-11-16 DIAGNOSIS — M5416 Radiculopathy, lumbar region: Secondary | ICD-10-CM | POA: Diagnosis not present

## 2020-11-20 ENCOUNTER — Ambulatory Visit: Payer: Medicare HMO | Admitting: Cardiology

## 2020-12-05 DIAGNOSIS — M5416 Radiculopathy, lumbar region: Secondary | ICD-10-CM | POA: Diagnosis not present

## 2020-12-05 DIAGNOSIS — R6889 Other general symptoms and signs: Secondary | ICD-10-CM | POA: Diagnosis not present

## 2020-12-06 ENCOUNTER — Other Ambulatory Visit: Payer: Self-pay

## 2020-12-06 ENCOUNTER — Ambulatory Visit (INDEPENDENT_AMBULATORY_CARE_PROVIDER_SITE_OTHER): Payer: Medicare HMO | Admitting: Family

## 2020-12-06 ENCOUNTER — Other Ambulatory Visit (HOSPITAL_BASED_OUTPATIENT_CLINIC_OR_DEPARTMENT_OTHER): Payer: Self-pay | Admitting: Family

## 2020-12-06 ENCOUNTER — Encounter (HOSPITAL_BASED_OUTPATIENT_CLINIC_OR_DEPARTMENT_OTHER): Payer: Self-pay | Admitting: Family

## 2020-12-06 VITALS — BP 146/61 | HR 65 | Ht 65.5 in | Wt 137.0 lb

## 2020-12-06 DIAGNOSIS — I25118 Atherosclerotic heart disease of native coronary artery with other forms of angina pectoris: Secondary | ICD-10-CM

## 2020-12-06 DIAGNOSIS — I5189 Other ill-defined heart diseases: Secondary | ICD-10-CM

## 2020-12-06 DIAGNOSIS — R0609 Other forms of dyspnea: Secondary | ICD-10-CM

## 2020-12-06 DIAGNOSIS — R0600 Dyspnea, unspecified: Secondary | ICD-10-CM | POA: Diagnosis not present

## 2020-12-06 DIAGNOSIS — R6889 Other general symptoms and signs: Secondary | ICD-10-CM | POA: Diagnosis not present

## 2020-12-06 DIAGNOSIS — I10 Essential (primary) hypertension: Secondary | ICD-10-CM | POA: Diagnosis not present

## 2020-12-06 DIAGNOSIS — E785 Hyperlipidemia, unspecified: Secondary | ICD-10-CM | POA: Diagnosis not present

## 2020-12-06 DIAGNOSIS — Z599 Problem related to housing and economic circumstances, unspecified: Secondary | ICD-10-CM

## 2020-12-06 MED ORDER — FUROSEMIDE 20 MG PO TABS: 20.0000 mg | ORAL_TABLET | Freq: Every day | ORAL | 1 refills | Status: DC

## 2020-12-06 NOTE — Patient Instructions (Addendum)
Medication Instructions:  Your physician has recommended you make the following change in your medication:  START Furosemide (Lasix) 20mg  one tablet daily  *If you need a refill on your cardiac medications before your next appointment, please call your pharmacy*  Lab Work: Your physician recommends that you return for lab work in 1 week for BMP  If you have labs (blood work) drawn today and your tests are completely normal, you will receive your results only by: [place] (if you have MyChart) OR A paper copy in the mail If you have any lab test that is abnormal or we need to change your treatment, we will call you to review the results.  Testing/Procedures: Your EKG today showed normal sinus rhythm which is a great result.  Your physician has requested that you have an echocardiogram. Echocardiography is a painless test that uses sound waves to create images of your heart. It provides your doctor with information about the size and shape of your heart and how well your heart's chambers and valves are working. This procedure takes approximately one hour. There are no restrictions for this procedure.   Follow-Up: At [facility], you and your health needs are our priority.  As part of our continuing mission to provide you with exceptional heart care, we have created designated Provider Care Teams.  These Care Teams include your primary Cardiologist (physician) and Advanced Practice Providers (APPs -  Physician Assistants and Nurse Practitioners) who all work together to provide you with the care you need, when you need it.  We recommend signing up for the patient portal called "MyChart".  Sign up information is provided on this After Visit Summary.  MyChart is used to connect with patients for Virtual Visits (Telemedicine).  Patients are able to view lab/test results, encounter notes, upcoming appointments, etc.  Non-urgent messages can be sent to your provider as well.   To learn more  about what you can do with MyChart, go to [email or web address].    Your next appointment:   2 month(s)  The format for your next appointment:   In Person  Provider:   You may see Glenetta Hew, MD or one of the following Advanced Practice Providers on your designated Care Team:   Rosaria Ferries, PA-C Jory Sims, DNP, ANP  Other Instructions  Loel Dubonnet, NP will reach out to our social work team to see if they have any additional resources for housing. They will be contacting you.   Heart Healthy Diet Recommendations: A low-salt diet is recommended. Meats should be grilled, baked, or boiled. Avoid fried foods. Focus on lean protein sources like fish or chicken with vegetables and fruits. The American Heart Association is a Microbiologist!  American Heart Association Diet and Lifeystyle Recommendations     Exercise recommendations: The American Heart Association recommends 150 minutes of moderate intensity exercise weekly. Try 30 minutes of moderate intensity exercise 4-5 times per week. This could include walking, jogging, or swimming.    To prevent or reduce lower extremity swelling: Eat a low salt diet. Salt makes the body hold onto extra fluid which causes swelling. Sit with legs elevated. For example, in the recliner or on an [place].  Wear knee-high compression stockings during the daytime. Ones labeled 15-20 mmHg provide good compression.
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
## 2020-12-07 ENCOUNTER — Telehealth: Payer: Self-pay | Admitting: Licensed Clinical Social Worker

## 2020-12-07 NOTE — Telephone Encounter (Signed)
CSW received referral to assist patient with housing resources. Patient states she is currently staying with her granddaughter and family. She has a monthly income of approximately $800 and her caregiver/roommate has a monthly income of $700. Patient has already explored some options and reports she is on the waiting list for [facility]. CSW discussed challenges with housing resources in the [place] area and limited options. CSW suggested contacting [employer] to explore options for senior housing. CSW also provided additional housing resources and contact information. Patient verbalizes understanding and follow up. CSW available as needed. Michele [name], [place], [place]

## 2020-12-08 DIAGNOSIS — I10 Essential (primary) hypertension: Secondary | ICD-10-CM | POA: Diagnosis not present

## 2020-12-08 DIAGNOSIS — I25118 Atherosclerotic heart disease of native coronary artery with other forms of angina pectoris: Secondary | ICD-10-CM | POA: Diagnosis not present

## 2020-12-08 DIAGNOSIS — R6889 Other general symptoms and signs: Secondary | ICD-10-CM | POA: Diagnosis not present

## 2020-12-08 DIAGNOSIS — I5189 Other ill-defined heart diseases: Secondary | ICD-10-CM | POA: Diagnosis not present

## 2020-12-08 DIAGNOSIS — M5416 Radiculopathy, lumbar region: Secondary | ICD-10-CM | POA: Diagnosis not present

## 2020-12-09 LAB — BASIC METABOLIC PANEL
BUN/Creatinine Ratio: 17 (ref 12–28)
BUN: 11 mg/dL (ref 8–27)
CO2: 19 mmol/L — ABNORMAL LOW (ref 20–29)
Calcium: 10.2 mg/dL (ref 8.7–10.3)
Chloride: 108 mmol/L — ABNORMAL HIGH (ref 96–106)
Creatinine, Ser: 0.65 mg/dL (ref 0.57–1.00)
Glucose: 96 mg/dL (ref 65–99)
Potassium: 4.7 mmol/L (ref 3.5–5.2)
Sodium: 150 mmol/L — ABNORMAL HIGH (ref 134–144)
eGFR: 97 mL/min/{1.73_m2} (ref >59)

## 2020-12-11 NOTE — Progress Notes (Signed)
Patient has reviewed labs on MyChart. Removing from inbasket.

## 2020-12-12 DIAGNOSIS — M545 Low back pain, unspecified: Secondary | ICD-10-CM | POA: Diagnosis not present

## 2020-12-12 DIAGNOSIS — R6889 Other general symptoms and signs: Secondary | ICD-10-CM | POA: Diagnosis not present

## 2020-12-12 DIAGNOSIS — M5416 Radiculopathy, lumbar region: Secondary | ICD-10-CM | POA: Diagnosis not present

## 2020-12-13 ENCOUNTER — Telehealth: Payer: Self-pay | Admitting: Licensed Clinical Social Worker

## 2020-12-13 NOTE — Telephone Encounter (Signed)
F/u call made to pt this afternoon in regards to housing/conversation had with colleague Kennyth Lose. Introduced self, role, reason for call. Conversation extremely limited by pt hearing. She shares that she lives with her granddaughter but that the housing is limited by space and her granddaughter is pregnant and about to have another child. Attempted to discuss additional request from [place], NP, in regards to adult incontinence supplies however pt diverted conversation to her granddaughter potentially having exposure to [place] at baby shower and is experiencing symptoms. LCSW encouraged pt to reach out to her primary care office, for her granddaughter to reach out to her OB/GYN and offered to send free testing sites via text message. Pt agreeable. I have texted her [place] free testing sites via text and relayed the above information again. Remain available as needed.   Westley Hummer, MSW, [place]  [phone]

## 2020-12-15 DIAGNOSIS — Z03818 Encounter for observation for suspected exposure to other biological agents ruled out: Secondary | ICD-10-CM | POA: Diagnosis not present

## 2020-12-15 DIAGNOSIS — Z20822 Contact with and (suspected) exposure to covid-19: Secondary | ICD-10-CM | POA: Diagnosis not present

## 2020-12-20 ENCOUNTER — Emergency Department (HOSPITAL_BASED_OUTPATIENT_CLINIC_OR_DEPARTMENT_OTHER): Payer: Medicare HMO

## 2020-12-20 ENCOUNTER — Other Ambulatory Visit: Payer: Self-pay

## 2020-12-20 ENCOUNTER — Encounter (HOSPITAL_BASED_OUTPATIENT_CLINIC_OR_DEPARTMENT_OTHER): Payer: Self-pay | Admitting: Emergency Medicine

## 2020-12-20 ENCOUNTER — Inpatient Hospital Stay (HOSPITAL_BASED_OUTPATIENT_CLINIC_OR_DEPARTMENT_OTHER)
Admission: EM | Admit: 2020-12-20 | Discharge: 2020-12-23 | DRG: 177 | Disposition: A | Discharge status: Home Health | Payer: Medicare HMO | Attending: Internal Medicine | Admitting: Internal Medicine

## 2020-12-20 DIAGNOSIS — Z720 Tobacco use: Secondary | ICD-10-CM | POA: Diagnosis present

## 2020-12-20 DIAGNOSIS — M791 Myalgia, unspecified site: Secondary | ICD-10-CM

## 2020-12-20 DIAGNOSIS — R0602 Shortness of breath: Secondary | ICD-10-CM | POA: Diagnosis present

## 2020-12-20 DIAGNOSIS — R0902 Hypoxemia: Secondary | ICD-10-CM

## 2020-12-20 DIAGNOSIS — Z7951 Long term (current) use of inhaled steroids: Secondary | ICD-10-CM

## 2020-12-20 DIAGNOSIS — F419 Anxiety disorder, unspecified: Secondary | ICD-10-CM

## 2020-12-20 DIAGNOSIS — F32A Depression, unspecified: Secondary | ICD-10-CM | POA: Diagnosis present

## 2020-12-20 DIAGNOSIS — K219 Gastro-esophageal reflux disease without esophagitis: Secondary | ICD-10-CM | POA: Diagnosis present

## 2020-12-20 DIAGNOSIS — J069 Acute upper respiratory infection, unspecified: Secondary | ICD-10-CM | POA: Diagnosis not present

## 2020-12-20 DIAGNOSIS — M549 Dorsalgia, unspecified: Secondary | ICD-10-CM | POA: Diagnosis not present

## 2020-12-20 DIAGNOSIS — J449 Chronic obstructive pulmonary disease, unspecified: Secondary | ICD-10-CM | POA: Diagnosis present

## 2020-12-20 DIAGNOSIS — I252 Old myocardial infarction: Secondary | ICD-10-CM

## 2020-12-20 DIAGNOSIS — C3491 Malignant neoplasm of unspecified part of right bronchus or lung: Secondary | ICD-10-CM | POA: Diagnosis present

## 2020-12-20 DIAGNOSIS — Z8249 Family history of ischemic heart disease and other diseases of the circulatory system: Secondary | ICD-10-CM

## 2020-12-20 DIAGNOSIS — E785 Hyperlipidemia, unspecified: Secondary | ICD-10-CM | POA: Diagnosis present

## 2020-12-20 DIAGNOSIS — G2581 Restless legs syndrome: Secondary | ICD-10-CM | POA: Diagnosis present

## 2020-12-20 DIAGNOSIS — Z800 Family history of malignant neoplasm of digestive organs: Secondary | ICD-10-CM | POA: Diagnosis not present

## 2020-12-20 DIAGNOSIS — M79604 Pain in right leg: Secondary | ICD-10-CM | POA: Diagnosis not present

## 2020-12-20 DIAGNOSIS — E559 Vitamin D deficiency, unspecified: Secondary | ICD-10-CM | POA: Diagnosis present

## 2020-12-20 DIAGNOSIS — Z85118 Personal history of other malignant neoplasm of bronchus and lung: Secondary | ICD-10-CM

## 2020-12-20 DIAGNOSIS — R32 Unspecified urinary incontinence: Secondary | ICD-10-CM | POA: Diagnosis present

## 2020-12-20 DIAGNOSIS — J439 Emphysema, unspecified: Secondary | ICD-10-CM | POA: Diagnosis present

## 2020-12-20 DIAGNOSIS — I714 Abdominal aortic aneurysm, without rupture: Secondary | ICD-10-CM | POA: Diagnosis present

## 2020-12-20 DIAGNOSIS — Z7902 Long term (current) use of antithrombotics/antiplatelets: Secondary | ICD-10-CM | POA: Diagnosis not present

## 2020-12-20 DIAGNOSIS — G8929 Other chronic pain: Secondary | ICD-10-CM | POA: Diagnosis present

## 2020-12-20 DIAGNOSIS — M545 Low back pain, unspecified: Secondary | ICD-10-CM | POA: Diagnosis not present

## 2020-12-20 DIAGNOSIS — F17210 Nicotine dependence, cigarettes, uncomplicated: Secondary | ICD-10-CM | POA: Diagnosis present

## 2020-12-20 DIAGNOSIS — Z79899 Other long term (current) drug therapy: Secondary | ICD-10-CM

## 2020-12-20 DIAGNOSIS — I2510 Atherosclerotic heart disease of native coronary artery without angina pectoris: Secondary | ICD-10-CM | POA: Diagnosis present

## 2020-12-20 DIAGNOSIS — J9601 Acute respiratory failure with hypoxia: Secondary | ICD-10-CM | POA: Diagnosis present

## 2020-12-20 DIAGNOSIS — R109 Unspecified abdominal pain: Secondary | ICD-10-CM | POA: Diagnosis not present

## 2020-12-20 DIAGNOSIS — Z8049 Family history of malignant neoplasm of other genital organs: Secondary | ICD-10-CM | POA: Diagnosis not present

## 2020-12-20 DIAGNOSIS — Z955 Presence of coronary angioplasty implant and graft: Secondary | ICD-10-CM

## 2020-12-20 DIAGNOSIS — Z9221 Personal history of antineoplastic chemotherapy: Secondary | ICD-10-CM | POA: Diagnosis not present

## 2020-12-20 DIAGNOSIS — Z923 Personal history of irradiation: Secondary | ICD-10-CM | POA: Diagnosis not present

## 2020-12-20 DIAGNOSIS — R52 Pain, unspecified: Secondary | ICD-10-CM | POA: Diagnosis not present

## 2020-12-20 DIAGNOSIS — U071 COVID-19: Secondary | ICD-10-CM | POA: Diagnosis not present

## 2020-12-20 DIAGNOSIS — R296 Repeated falls: Secondary | ICD-10-CM | POA: Diagnosis present

## 2020-12-20 DIAGNOSIS — M79605 Pain in left leg: Secondary | ICD-10-CM | POA: Diagnosis not present

## 2020-12-20 DIAGNOSIS — Z808 Family history of malignant neoplasm of other organs or systems: Secondary | ICD-10-CM

## 2020-12-20 DIAGNOSIS — Z8051 Family history of malignant neoplasm of kidney: Secondary | ICD-10-CM

## 2020-12-20 DIAGNOSIS — F411 Generalized anxiety disorder: Secondary | ICD-10-CM | POA: Diagnosis present

## 2020-12-20 DIAGNOSIS — I959 Hypotension, unspecified: Secondary | ICD-10-CM | POA: Diagnosis not present

## 2020-12-20 DIAGNOSIS — J441 Chronic obstructive pulmonary disease with (acute) exacerbation: Secondary | ICD-10-CM | POA: Diagnosis present

## 2020-12-20 LAB — CBC WITH DIFFERENTIAL/PLATELET
Abs Immature Granulocytes: 0.01 10*3/uL (ref 0.00–0.07)
Basophils Absolute: 0 10*3/uL (ref 0.0–0.1)
Basophils Relative: 0 %
Eosinophils Absolute: 0 10*3/uL (ref 0.0–0.5)
Eosinophils Relative: 0 %
HCT: 42.4 % (ref 36.0–46.0)
Hemoglobin: 14.5 g/dL (ref 12.0–15.0)
Immature Granulocytes: 0 %
Lymphocytes Relative: 11 %
Lymphs Abs: 0.4 10*3/uL — ABNORMAL LOW (ref 0.7–4.0)
MCH: 30.6 pg (ref 26.0–34.0)
MCHC: 34.2 g/dL (ref 30.0–36.0)
MCV: 89.5 fL (ref 80.0–100.0)
Monocytes Absolute: 0.5 10*3/uL (ref 0.1–1.0)
Monocytes Relative: 14 %
Neutro Abs: 2.8 10*3/uL (ref 1.7–7.7)
Neutrophils Relative %: 75 %
Platelets: 136 10*3/uL — ABNORMAL LOW (ref 150–400)
RBC: 4.74 MIL/uL (ref 3.87–5.11)
RDW: 12.9 % (ref 11.5–15.5)
WBC: 3.7 10*3/uL — ABNORMAL LOW (ref 4.0–10.5)
nRBC: 0 % (ref 0.0–0.2)

## 2020-12-20 LAB — COMPREHENSIVE METABOLIC PANEL
ALT: 11 U/L (ref 0–44)
AST: 26 U/L (ref 15–41)
Albumin: 4.4 g/dL (ref 3.5–5.0)
Alkaline Phosphatase: 62 U/L (ref 38–126)
Anion gap: 13 (ref 5–15)
BUN: 14 mg/dL (ref 8–23)
CO2: 24 mmol/L (ref 22–32)
Calcium: 9.2 mg/dL (ref 8.9–10.3)
Chloride: 97 mmol/L — ABNORMAL LOW (ref 98–111)
Creatinine, Ser: 0.57 mg/dL (ref 0.44–1.00)
GFR, Estimated: >60 mL/min (ref >60)
Glucose, Bld: 115 mg/dL — ABNORMAL HIGH (ref 70–99)
Potassium: 3.6 mmol/L (ref 3.5–5.1)
Sodium: 134 mmol/L — ABNORMAL LOW (ref 135–145)
Total Bilirubin: 0.4 mg/dL (ref 0.3–1.2)
Total Protein: 6.8 g/dL (ref 6.5–8.1)

## 2020-12-20 LAB — RESP PANEL BY RT-PCR (FLU A&B, COVID) ARPGX2
Influenza A by PCR: NEGATIVE
Influenza B by PCR: NEGATIVE
SARS Coronavirus 2 by RT PCR: POSITIVE — AB

## 2020-12-20 MED ORDER — ONDANSETRON HCL 4 MG/2ML IJ SOLN
4.0000 mg | Freq: Once | INTRAMUSCULAR | Status: AC
  Administered 2020-12-20: 4 mg via INTRAVENOUS
  Filled 2020-12-20: qty 2

## 2020-12-20 MED ORDER — IOHEXOL 350 MG/ML SOLN
100.0000 mL | Freq: Once | INTRAVENOUS | Status: AC | PRN
  Administered 2020-12-20: 75 mL via INTRAVENOUS

## 2020-12-20 MED ORDER — HYDROMORPHONE HCL 1 MG/ML IJ SOLN
1.0000 mg | Freq: Once | INTRAMUSCULAR | Status: AC
  Administered 2020-12-20: 1 mg via INTRAVENOUS
  Filled 2020-12-20: qty 1

## 2020-12-20 NOTE — ED Notes (Signed)
Per CT, pt has refused CT.

## 2020-12-20 NOTE — ED Triage Notes (Signed)
Pt arrives to ED via [facility] with c/o bilateral thigh pain x4 days. States pain has been so severe that she is unable to sleep at night. Pt reports she is having difficulty walking. However pt mentioned that her significant other tested positive for COVID x5 days ago. Pts at home test negative.
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
## 2020-12-20 NOTE — Plan of Care (Signed)
[facility] will assume care on arrival to accepting facility. Until arrival, care as per EDP. However, [facility] available 24/7 for questions and assistance.  

## 2020-12-20 NOTE — ED Notes (Signed)
CRITICAL VALUE STICKER  CRITICAL VALUE:covid +  RECEIVER (on-site recipient of call):Shawnie Pons, RN  DATE & TIME NOTIFIED: 12-20-2020 1659  MESSENGER (representative from lab):  MD NOTIFIED:Dr. Joya Gaskins   TIME OF NOTIFICATION:1700  RESPONSE:

## 2020-12-21 ENCOUNTER — Observation Stay (HOSPITAL_COMMUNITY): Payer: Medicare HMO

## 2020-12-21 DIAGNOSIS — Z79899 Other long term (current) drug therapy: Secondary | ICD-10-CM | POA: Diagnosis not present

## 2020-12-21 DIAGNOSIS — Z800 Family history of malignant neoplasm of digestive organs: Secondary | ICD-10-CM | POA: Diagnosis not present

## 2020-12-21 DIAGNOSIS — J9601 Acute respiratory failure with hypoxia: Secondary | ICD-10-CM | POA: Diagnosis not present

## 2020-12-21 DIAGNOSIS — E785 Hyperlipidemia, unspecified: Secondary | ICD-10-CM | POA: Diagnosis not present

## 2020-12-21 DIAGNOSIS — F32A Depression, unspecified: Secondary | ICD-10-CM | POA: Diagnosis not present

## 2020-12-21 DIAGNOSIS — U071 COVID-19: Secondary | ICD-10-CM | POA: Diagnosis not present

## 2020-12-21 DIAGNOSIS — Z955 Presence of coronary angioplasty implant and graft: Secondary | ICD-10-CM | POA: Diagnosis not present

## 2020-12-21 DIAGNOSIS — Z8049 Family history of malignant neoplasm of other genital organs: Secondary | ICD-10-CM | POA: Diagnosis not present

## 2020-12-21 DIAGNOSIS — I2510 Atherosclerotic heart disease of native coronary artery without angina pectoris: Secondary | ICD-10-CM | POA: Diagnosis not present

## 2020-12-21 DIAGNOSIS — G2581 Restless legs syndrome: Secondary | ICD-10-CM | POA: Diagnosis not present

## 2020-12-21 DIAGNOSIS — F17210 Nicotine dependence, cigarettes, uncomplicated: Secondary | ICD-10-CM | POA: Diagnosis not present

## 2020-12-21 DIAGNOSIS — J069 Acute upper respiratory infection, unspecified: Secondary | ICD-10-CM | POA: Diagnosis not present

## 2020-12-21 DIAGNOSIS — Z85118 Personal history of other malignant neoplasm of bronchus and lung: Secondary | ICD-10-CM | POA: Diagnosis not present

## 2020-12-21 DIAGNOSIS — Z7951 Long term (current) use of inhaled steroids: Secondary | ICD-10-CM | POA: Diagnosis not present

## 2020-12-21 DIAGNOSIS — Z923 Personal history of irradiation: Secondary | ICD-10-CM | POA: Diagnosis not present

## 2020-12-21 DIAGNOSIS — Z9221 Personal history of antineoplastic chemotherapy: Secondary | ICD-10-CM | POA: Diagnosis not present

## 2020-12-21 DIAGNOSIS — M545 Low back pain, unspecified: Secondary | ICD-10-CM | POA: Diagnosis not present

## 2020-12-21 DIAGNOSIS — R296 Repeated falls: Secondary | ICD-10-CM | POA: Diagnosis not present

## 2020-12-21 DIAGNOSIS — Z7902 Long term (current) use of antithrombotics/antiplatelets: Secondary | ICD-10-CM | POA: Diagnosis not present

## 2020-12-21 DIAGNOSIS — J439 Emphysema, unspecified: Secondary | ICD-10-CM | POA: Diagnosis not present

## 2020-12-21 DIAGNOSIS — G8929 Other chronic pain: Secondary | ICD-10-CM | POA: Diagnosis not present

## 2020-12-21 DIAGNOSIS — I252 Old myocardial infarction: Secondary | ICD-10-CM | POA: Diagnosis not present

## 2020-12-21 DIAGNOSIS — F419 Anxiety disorder, unspecified: Secondary | ICD-10-CM | POA: Diagnosis not present

## 2020-12-21 DIAGNOSIS — E559 Vitamin D deficiency, unspecified: Secondary | ICD-10-CM | POA: Diagnosis not present

## 2020-12-21 DIAGNOSIS — K219 Gastro-esophageal reflux disease without esophagitis: Secondary | ICD-10-CM | POA: Diagnosis not present

## 2020-12-21 DIAGNOSIS — R0602 Shortness of breath: Secondary | ICD-10-CM | POA: Diagnosis present

## 2020-12-21 DIAGNOSIS — R32 Unspecified urinary incontinence: Secondary | ICD-10-CM | POA: Diagnosis not present

## 2020-12-21 LAB — URINALYSIS, ROUTINE W REFLEX MICROSCOPIC
Bilirubin Urine: NEGATIVE
Glucose, UA: NEGATIVE mg/dL
Hgb urine dipstick: NEGATIVE
Ketones, ur: 40 mg/dL — AB
Leukocytes,Ua: NEGATIVE
Nitrite: NEGATIVE
Protein, ur: 30 mg/dL — AB
Specific Gravity, Urine: >1.046 — ABNORMAL HIGH (ref 1.005–1.030)
pH: 6 (ref 5.0–8.0)

## 2020-12-21 LAB — D-DIMER, QUANTITATIVE: D-Dimer, Quant: 0.56 ug/mL-FEU — ABNORMAL HIGH (ref 0.00–0.50)

## 2020-12-21 LAB — LACTATE DEHYDROGENASE: LDH: 125 U/L (ref 98–192)

## 2020-12-21 LAB — HIV ANTIBODY (ROUTINE TESTING W REFLEX): HIV Screen 4th Generation wRfx: NONREACTIVE

## 2020-12-21 LAB — FIBRINOGEN: Fibrinogen: 388 mg/dL (ref 210–475)

## 2020-12-21 LAB — FERRITIN: Ferritin: 233 ng/mL (ref 11–307)

## 2020-12-21 LAB — CK: Total CK: 271 U/L — ABNORMAL HIGH (ref 38–234)

## 2020-12-21 MED ORDER — ASCORBIC ACID 500 MG PO TABS
500.0000 mg | ORAL_TABLET | Freq: Every day | ORAL | Status: DC
  Administered 2020-12-21 – 2020-12-23 (×3): 500 mg via ORAL
  Filled 2020-12-21 (×3): qty 1

## 2020-12-21 MED ORDER — SODIUM CHLORIDE 0.9 % IV SOLN
100.0000 mg | INTRAVENOUS | Status: AC
  Administered 2020-12-21 (×2): 100 mg via INTRAVENOUS

## 2020-12-21 MED ORDER — ENOXAPARIN SODIUM 40 MG/0.4ML IJ SOSY
40.0000 mg | PREFILLED_SYRINGE | Freq: Every day | INTRAMUSCULAR | Status: DC
  Administered 2020-12-21 – 2020-12-23 (×3): 40 mg via SUBCUTANEOUS
  Filled 2020-12-21 (×3): qty 0.4

## 2020-12-21 MED ORDER — HYDROMORPHONE HCL 1 MG/ML IJ SOLN
0.5000 mg | INTRAMUSCULAR | Status: DC | PRN
  Administered 2020-12-21 – 2020-12-22 (×3): 0.5 mg via INTRAVENOUS
  Filled 2020-12-21 (×2): qty 0.5

## 2020-12-21 MED ORDER — SODIUM CHLORIDE 0.9 % IV SOLN
100.0000 mg | Freq: Every day | INTRAVENOUS | Status: DC
  Administered 2020-12-21 (×2): 100 mg via INTRAVENOUS

## 2020-12-21 MED ORDER — DEXAMETHASONE SODIUM PHOSPHATE 10 MG/ML IJ SOLN
6.0000 mg | Freq: Once | INTRAMUSCULAR | Status: AC
  Administered 2020-12-21: 6 mg via INTRAVENOUS
  Filled 2020-12-21: qty 1

## 2020-12-21 MED ORDER — LACTATED RINGERS IV SOLN: INTRAVENOUS | Status: AC

## 2020-12-21 MED ORDER — HYDROMORPHONE HCL 1 MG/ML IJ SOLN
1.0000 mg | Freq: Once | INTRAMUSCULAR | Status: AC
Start: 2020-12-21 — End: 2020-12-21
  Administered 2020-12-21: 1 mg via INTRAVENOUS
  Filled 2020-12-21: qty 1

## 2020-12-21 MED ORDER — ACETAMINOPHEN 325 MG PO TABS: 650.0000 mg | ORAL_TABLET | ORAL | Status: DC | PRN

## 2020-12-21 MED ORDER — HYDROMORPHONE HCL 1 MG/ML IJ SOLN
0.5000 mg | INTRAMUSCULAR | Status: DC | PRN
  Administered 2020-12-21 (×2): 0.5 mg via INTRAVENOUS
  Filled 2020-12-21: qty 0.5
  Filled 2020-12-21 (×2): qty 1

## 2020-12-21 MED ORDER — DEXAMETHASONE SODIUM PHOSPHATE 10 MG/ML IJ SOLN
6.0000 mg | INTRAMUSCULAR | Status: DC
  Administered 2020-12-21 – 2020-12-22 (×2): 6 mg via INTRAVENOUS
  Filled 2020-12-21 (×2): qty 1

## 2020-12-21 MED ORDER — MELATONIN 5 MG PO TABS
5.0000 mg | ORAL_TABLET | Freq: Every day | ORAL | Status: DC
  Administered 2020-12-22: 5 mg via ORAL
  Filled 2020-12-21 (×2): qty 1

## 2020-12-21 MED ORDER — VITAMIN D 25 MCG (1000 UNIT) PO TABS
5000.0000 [IU] | ORAL_TABLET | Freq: Every day | ORAL | Status: DC
  Administered 2020-12-21 – 2020-12-23 (×3): 5000 [IU] via ORAL
  Filled 2020-12-21 (×3): qty 5

## 2020-12-21 MED ORDER — HYDROCODONE-ACETAMINOPHEN 5-325 MG PO TABS
1.0000 | ORAL_TABLET | Freq: Four times a day (QID) | ORAL | Status: DC | PRN
Start: 2020-12-21 — End: 2020-12-22
  Administered 2020-12-21 (×2): 1 via ORAL
  Filled 2020-12-21 (×3): qty 1

## 2020-12-21 MED ORDER — ACETAMINOPHEN 325 MG PO TABS: 650.0000 mg | ORAL_TABLET | Freq: Four times a day (QID) | ORAL | Status: DC | PRN

## 2020-12-21 MED ORDER — ZINC SULFATE 220 (50 ZN) MG PO CAPS
220.0000 mg | ORAL_CAPSULE | Freq: Every day | ORAL | Status: DC
  Administered 2020-12-21 – 2020-12-23 (×3): 220 mg via ORAL
  Filled 2020-12-21 (×3): qty 1

## 2020-12-21 NOTE — ED Notes (Signed)
Care handoff report given to care link

## 2020-12-21 NOTE — Progress Notes (Signed)
Pt has arrived to unit via stretcher by [employer]  [facility]. Pt alert and oriented x4 in no acute distress upon arrival. Pt ambulated from stretcher to hospital bed, gait steady. VSS. Respirations even and unlabored on 1L/min O2. Assessment completed. Skin assessment completed with Cyril Mourning RN. Pt oriented to room. Pt encouraged to use call bell for assistance. Call bell placed within reach. Bed in low position.

## 2020-12-21 NOTE — ED Notes (Signed)
Pt given Kind bar, cheese crackers and water, per Linus Orn - RN.

## 2020-12-21 NOTE — ED Notes (Signed)
Report called to [place], Therapist, sports, [place] at [facility].

## 2020-12-21 NOTE — ED Provider Notes (Signed)
7:48 AM Patient is currently boarding in the emergency department due to significant holding at [place].  Thus, I have talked to the patient and given she is barely requiring oxygen I think it would be in her best interest to start steroids.  She has had heart palpitations to prednisone in the past but no true allergies so I will start dexamethasone.  She is also thinking it hurts to urinate and so I will check a urinalysis.  Otherwise she remained stable and is currently pending admission.  9:19 AM Patient complains of persistent pain.  My suspicion that she has rhabdomyolysis is low but I will add a CK given her persistent pain requiring narcotics.  She is still waiting for admission.   Sherwood Gambler, MD 12/21/20 [phone]
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
## 2020-12-22 DIAGNOSIS — Z800 Family history of malignant neoplasm of digestive organs: Secondary | ICD-10-CM | POA: Diagnosis not present

## 2020-12-22 DIAGNOSIS — Z7951 Long term (current) use of inhaled steroids: Secondary | ICD-10-CM | POA: Diagnosis not present

## 2020-12-22 DIAGNOSIS — F32A Depression, unspecified: Secondary | ICD-10-CM | POA: Diagnosis present

## 2020-12-22 DIAGNOSIS — E785 Hyperlipidemia, unspecified: Secondary | ICD-10-CM | POA: Diagnosis present

## 2020-12-22 DIAGNOSIS — Z955 Presence of coronary angioplasty implant and graft: Secondary | ICD-10-CM | POA: Diagnosis not present

## 2020-12-22 DIAGNOSIS — I252 Old myocardial infarction: Secondary | ICD-10-CM | POA: Diagnosis not present

## 2020-12-22 DIAGNOSIS — Z79899 Other long term (current) drug therapy: Secondary | ICD-10-CM | POA: Diagnosis not present

## 2020-12-22 DIAGNOSIS — R296 Repeated falls: Secondary | ICD-10-CM | POA: Diagnosis present

## 2020-12-22 DIAGNOSIS — C3491 Malignant neoplasm of unspecified part of right bronchus or lung: Secondary | ICD-10-CM | POA: Diagnosis not present

## 2020-12-22 DIAGNOSIS — Z720 Tobacco use: Secondary | ICD-10-CM

## 2020-12-22 DIAGNOSIS — J9601 Acute respiratory failure with hypoxia: Secondary | ICD-10-CM | POA: Diagnosis present

## 2020-12-22 DIAGNOSIS — E559 Vitamin D deficiency, unspecified: Secondary | ICD-10-CM | POA: Diagnosis present

## 2020-12-22 DIAGNOSIS — U071 COVID-19: Secondary | ICD-10-CM | POA: Diagnosis present

## 2020-12-22 DIAGNOSIS — Z85118 Personal history of other malignant neoplasm of bronchus and lung: Secondary | ICD-10-CM | POA: Diagnosis not present

## 2020-12-22 DIAGNOSIS — I2510 Atherosclerotic heart disease of native coronary artery without angina pectoris: Secondary | ICD-10-CM | POA: Diagnosis present

## 2020-12-22 DIAGNOSIS — Z8049 Family history of malignant neoplasm of other genital organs: Secondary | ICD-10-CM | POA: Diagnosis not present

## 2020-12-22 DIAGNOSIS — J439 Emphysema, unspecified: Secondary | ICD-10-CM | POA: Diagnosis present

## 2020-12-22 DIAGNOSIS — M79604 Pain in right leg: Secondary | ICD-10-CM

## 2020-12-22 DIAGNOSIS — F17210 Nicotine dependence, cigarettes, uncomplicated: Secondary | ICD-10-CM | POA: Diagnosis present

## 2020-12-22 DIAGNOSIS — Z9221 Personal history of antineoplastic chemotherapy: Secondary | ICD-10-CM | POA: Diagnosis not present

## 2020-12-22 DIAGNOSIS — G8929 Other chronic pain: Secondary | ICD-10-CM | POA: Diagnosis present

## 2020-12-22 DIAGNOSIS — F419 Anxiety disorder, unspecified: Secondary | ICD-10-CM | POA: Diagnosis present

## 2020-12-22 DIAGNOSIS — R0602 Shortness of breath: Secondary | ICD-10-CM | POA: Diagnosis present

## 2020-12-22 DIAGNOSIS — G2581 Restless legs syndrome: Secondary | ICD-10-CM | POA: Diagnosis present

## 2020-12-22 DIAGNOSIS — Z7902 Long term (current) use of antithrombotics/antiplatelets: Secondary | ICD-10-CM | POA: Diagnosis not present

## 2020-12-22 DIAGNOSIS — J069 Acute upper respiratory infection, unspecified: Secondary | ICD-10-CM | POA: Diagnosis not present

## 2020-12-22 DIAGNOSIS — K219 Gastro-esophageal reflux disease without esophagitis: Secondary | ICD-10-CM | POA: Diagnosis present

## 2020-12-22 DIAGNOSIS — M79605 Pain in left leg: Secondary | ICD-10-CM | POA: Diagnosis not present

## 2020-12-22 DIAGNOSIS — R32 Unspecified urinary incontinence: Secondary | ICD-10-CM | POA: Diagnosis present

## 2020-12-22 DIAGNOSIS — Z923 Personal history of irradiation: Secondary | ICD-10-CM | POA: Diagnosis not present

## 2020-12-22 LAB — CBC WITH DIFFERENTIAL/PLATELET
Abs Immature Granulocytes: 0.01 10*3/uL (ref 0.00–0.07)
Basophils Absolute: 0 10*3/uL (ref 0.0–0.1)
Basophils Relative: 0 %
Eosinophils Absolute: 0 10*3/uL (ref 0.0–0.5)
Eosinophils Relative: 0 %
HCT: 39.8 % (ref 36.0–46.0)
Hemoglobin: 13.4 g/dL (ref 12.0–15.0)
Immature Granulocytes: 1 %
Lymphocytes Relative: 9 %
Lymphs Abs: 0.2 10*3/uL — ABNORMAL LOW (ref 0.7–4.0)
MCH: 30.4 pg (ref 26.0–34.0)
MCHC: 33.7 g/dL (ref 30.0–36.0)
MCV: 90.2 fL (ref 80.0–100.0)
Monocytes Absolute: 0.1 10*3/uL (ref 0.1–1.0)
Monocytes Relative: 6 %
Neutro Abs: 1.8 10*3/uL (ref 1.7–7.7)
Neutrophils Relative %: 84 %
Platelets: 126 10*3/uL — ABNORMAL LOW (ref 150–400)
RBC: 4.41 MIL/uL (ref 3.87–5.11)
RDW: 12.7 % (ref 11.5–15.5)
WBC: 2.1 10*3/uL — ABNORMAL LOW (ref 4.0–10.5)
nRBC: 0 % (ref 0.0–0.2)

## 2020-12-22 LAB — MAGNESIUM: Magnesium: 2 mg/dL (ref 1.7–2.4)

## 2020-12-22 LAB — COMPREHENSIVE METABOLIC PANEL
ALT: 17 U/L (ref 0–44)
AST: 31 U/L (ref 15–41)
Albumin: 3.6 g/dL (ref 3.5–5.0)
Alkaline Phosphatase: 52 U/L (ref 38–126)
Anion gap: 9 (ref 5–15)
BUN: 14 mg/dL (ref 8–23)
CO2: 28 mmol/L (ref 22–32)
Calcium: 9.2 mg/dL (ref 8.9–10.3)
Chloride: 99 mmol/L (ref 98–111)
Creatinine, Ser: 0.62 mg/dL (ref 0.44–1.00)
GFR, Estimated: >60 mL/min (ref >60)
Glucose, Bld: 207 mg/dL — ABNORMAL HIGH (ref 70–99)
Potassium: 3.6 mmol/L (ref 3.5–5.1)
Sodium: 136 mmol/L (ref 135–145)
Total Bilirubin: 0.7 mg/dL (ref 0.3–1.2)
Total Protein: 5.9 g/dL — ABNORMAL LOW (ref 6.5–8.1)

## 2020-12-22 LAB — PHOSPHORUS: Phosphorus: 3 mg/dL (ref 2.5–4.6)

## 2020-12-22 MED ORDER — NITROGLYCERIN 0.4 MG SL SUBL: 0.4000 mg | SUBLINGUAL_TABLET | SUBLINGUAL | Status: DC | PRN

## 2020-12-22 MED ORDER — UMECLIDINIUM BROMIDE 62.5 MCG/INH IN AEPB
1.0000 | INHALATION_SPRAY | Freq: Every day | RESPIRATORY_TRACT | Status: DC
  Administered 2020-12-23: 1 via RESPIRATORY_TRACT
  Filled 2020-12-22: qty 7

## 2020-12-22 MED ORDER — PRAVASTATIN SODIUM 40 MG PO TABS
80.0000 mg | ORAL_TABLET | ORAL | Status: DC
  Administered 2020-12-22: 80 mg via ORAL
  Filled 2020-12-22: qty 2

## 2020-12-22 MED ORDER — LEVALBUTEROL TARTRATE 45 MCG/ACT IN AERO: 1.0000 | INHALATION_SPRAY | Freq: Four times a day (QID) | RESPIRATORY_TRACT | Status: DC | PRN

## 2020-12-22 MED ORDER — CLOPIDOGREL BISULFATE 75 MG PO TABS
75.0000 mg | ORAL_TABLET | ORAL | Status: DC
  Administered 2020-12-22: 75 mg via ORAL
  Filled 2020-12-22: qty 1

## 2020-12-22 MED ORDER — HYDROCODONE-ACETAMINOPHEN 5-325 MG PO TABS
1.0000 | ORAL_TABLET | Freq: Four times a day (QID) | ORAL | Status: DC | PRN
  Administered 2020-12-22 – 2020-12-23 (×2): 1 via ORAL
  Filled 2020-12-22: qty 1

## 2020-12-22 MED ORDER — FUROSEMIDE 20 MG PO TABS
20.0000 mg | ORAL_TABLET | Freq: Every day | ORAL | Status: DC
  Administered 2020-12-22 – 2020-12-23 (×2): 20 mg via ORAL
  Filled 2020-12-22 (×2): qty 1

## 2020-12-22 MED ORDER — PANTOPRAZOLE SODIUM 40 MG PO TBEC
40.0000 mg | DELAYED_RELEASE_TABLET | ORAL | Status: DC
  Administered 2020-12-22: 40 mg via ORAL
  Filled 2020-12-22: qty 1

## 2020-12-22 MED ORDER — CYCLOSPORINE 0.05 % OP EMUL
1.0000 [drp] | Freq: Two times a day (BID) | OPHTHALMIC | Status: DC
  Administered 2020-12-22 – 2020-12-23 (×3): 1 [drp] via OPHTHALMIC
  Filled 2020-12-22 (×4): qty 1

## 2020-12-22 MED ORDER — MIDODRINE HCL 5 MG PO TABS
2.5000 mg | ORAL_TABLET | Freq: Every day | ORAL | Status: DC
  Administered 2020-12-22 – 2020-12-23 (×2): 2.5 mg via ORAL
  Filled 2020-12-22 (×2): qty 1

## 2020-12-22 MED ORDER — ACETAMINOPHEN 325 MG PO TABS: 650.0000 mg | ORAL_TABLET | Freq: Four times a day (QID) | ORAL | Status: DC | PRN

## 2020-12-22 MED ORDER — ISOSORBIDE MONONITRATE ER 30 MG PO TB24
30.0000 mg | ORAL_TABLET | Freq: Every day | ORAL | Status: DC
  Administered 2020-12-22: 30 mg via ORAL
  Filled 2020-12-22: qty 1

## 2020-12-22 MED ORDER — FLUTICASONE PROPIONATE 50 MCG/ACT NA SUSP: 1.0000 | Freq: Every day | NASAL | Status: DC | PRN

## 2020-12-22 MED ORDER — GABAPENTIN 100 MG PO CAPS: 100.0000 mg | ORAL_CAPSULE | Freq: Every day | ORAL | Status: DC | PRN

## 2020-12-22 MED ORDER — ICOSAPENT ETHYL 1 G PO CAPS
2.0000 g | ORAL_CAPSULE | Freq: Two times a day (BID) | ORAL | Status: DC
  Administered 2020-12-22 – 2020-12-23 (×3): 2 g via ORAL
  Filled 2020-12-22 (×4): qty 2

## 2020-12-22 MED ORDER — ALPRAZOLAM 0.5 MG PO TABS
1.0000 mg | ORAL_TABLET | Freq: Two times a day (BID) | ORAL | Status: DC
  Administered 2020-12-22 – 2020-12-23 (×3): 1 mg via ORAL
  Filled 2020-12-22 (×3): qty 2

## 2020-12-22 NOTE — Assessment & Plan Note (Addendum)
--   Patient times symptoms onset of COVID, expect spontaneous resolution, no definite signs or symptoms to suggest polymyalgia rheumatica or dermatomyositis although this could be considered in the outpatient setting if patient fails to improve.  CK only mildly elevated. -- MRI lumbar spine unremarkable.  Follow-up with orthopedics as an outpatient. -- Has frequent falls at home.  PT evaluation appreciated.  Home health PT, rolling walker. -- Tolerating oral, stop IV analgesia.

## 2020-12-22 NOTE — Assessment & Plan Note (Signed)
--   Suggest cessation on discharge

## 2020-12-22 NOTE — Progress Notes (Addendum)
PROGRESS NOTE  Michele [name] OJJ:[id number] DOB: 01-23-1956 DOA: 12/20/2020 PCP: [name], MD  Brief History   65 year old woman PMH small cell carcinoma lung, chronic low back pain, bilateral leg pain, presented with report of weakness and pain in her legs acute on chronic.  Also noted some shortness of breath.  Boyfriend and patient tested positive for COVID perhaps a week ago.  Mildly hypoxic in the emergency department, started on oxygen and remdesivir.   A & P  * Acute respiratory disease due to COVID-19 virus -- Hypoxic (88-92%), therefore being treated with IV steroid.  CXR NAD. Given diagnosis of COVID 5 more days ago will not initiate remdesivir at this time.  She does have risk factors including lung cancer and therefore warrants close observation in the hospital. -- Follow closely for any worsening of oxygen requirement.  Continue zinc and vitamin C.  Continue steroids.  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  Bilateral leg pain -- Patient times symptoms onset of COVID, expect spontaneous resolution, no definite signs or symptoms to suggest polymyalgia rheumatica or dermatomyositis although this could be considered in the outpatient setting if patient fails to improve.  CK only mildly elevated. -- MRI lumbar spine unremarkable.  Follow-up with orthopedics as an outpatient. -- Has frequent falls at home.  PT evaluation appreciated.  Home health PT, rolling walker. -- Tolerating oral, stop IV analgesia.  CAD S/P percutaneous coronary angioplasty --asymptomatic.  Continue Plavix, statin, Imdur.  Tobacco abuse -- Suggest cessation on discharge  LOW BACK PAIN -- Chronic.  Lumbar spine did showed disc bulge.  Follow-up with [name] as an outpatient.  COPD (chronic obstructive pulmonary disease) (HCC) --continue alprazolam  Disposition Plan:  Discussion: Given borderline hypoxia, history of lung cancer, continue steroids,  follow overnight.  May be able to go home tomorrow.  Status is: Inpatient  Remains inpatient appropriate because:IV treatments appropriate due to intensity of illness or inability to take PO and Inpatient level of care appropriate due to severity of illness  Dispo: The patient is from: Home              Anticipated d/c is to: Home              Patient currently is not medically stable to d/c.   Difficult to place patient No  DVT prophylaxis: enoxaparin (LOVENOX) injection 40 mg Start: 12/21/20 1030   Code Status: Full Code Level of care: Med-Surg Family Communication: none  Murray Hodgkins, MD  [facility] Direct contact: see [email or web address] (further directions at bottom of note if needed) 7PM-7AM contact night coverage as at bottom of note 12/22/2020, 5:01 PM  LOS: 0 days    Interval History/Subjective  CC: f/u SOB, back pain  Reported chronic back pain, worse after developing COVID with muscle aches.  Reports multiple falls at home.  Seems to be breathing okay now.  Does not use oxygen at home.  Tested positive for COVID a week or more ago  Objective   Vitals:  Vitals:   12/22/20 1100 12/22/20 1620  BP: (!) 103/59 116/73  Pulse: 68 77  Resp: 14 19  Temp: 97.7 F (36.5 C) 98.2 F (36.8 C)  SpO2: 93% 91%    Exam: Physical Exam Vitals and nursing note reviewed.  Constitutional:      Appearance: She is not ill-appearing or toxic-appearing.  Cardiovascular:     Rate and Rhythm: Normal rate and regular rhythm.  Heart sounds: No murmur heard. Pulmonary:     Effort: Pulmonary effort is normal. No respiratory distress.     Breath sounds: Normal breath sounds. No wheezing, rhonchi or rales.  Neurological:     Mental Status: She is alert.     Motor: No weakness (of LE).  Psychiatric:        Mood and Affect: Mood normal.        Behavior: Behavior normal.   I have personally reviewed the labs and other data, making special note of:   Today's Data  BMP,  magnesium and phosphorus unremarkable.  LFTs unremarkable. WBC 2.1, platelets 126. Ddimer high, c/w COVID   Scheduled Meds:  ALPRAZolam  1 mg Oral BID   vitamin C  500 mg Oral Daily   cholecalciferol  5,000 Units Oral Daily   clopidogrel  75 mg Oral Q M,W,F   cycloSPORINE  1 drop Both Eyes BID   dexamethasone (DECADRON) injection  6 mg Intravenous Q24H   enoxaparin (LOVENOX) injection  40 mg Subcutaneous Daily   furosemide  20 mg Oral Daily   icosapent Ethyl  2 g Oral BID   isosorbide mononitrate  30 mg Oral QHS   melatonin  5 mg Oral QHS   midodrine  2.5 mg Oral Daily   pantoprazole  40 mg Oral QODAY   pravastatin  80 mg Oral Q M,W,F-1800   umeclidinium bromide  1 puff Inhalation Daily   zinc sulfate  220 mg Oral Daily   Continuous Infusions:  Principal Problem:   Acute respiratory disease due to COVID-19 virus Active Problems:   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  Check the care team in [facility] and look for a) attending/consulting [facility] provider listed and b) the [facility] team listed Log into [email or web address] and use [place]'s universal password to access. If you do not have the password, please contact the hospital operator. Locate the [facility] provider you are looking for under [facility] and page to a number that you can be directly reached. If you still have difficulty reaching the provider, please page the [facility] (Director on Call) for the Hospitalists listed on amion for assistance.

## 2020-12-22 NOTE — Hospital Course (Addendum)
65 year old woman PMH small cell carcinoma lung, chronic low back pain, bilateral leg pain, presented with report of weakness and pain in her legs acute on chronic.  Also noted some shortness of breath.  Boyfriend and patient tested positive for COVID perhaps a week ago.  Mildly hypoxic in the emergency department, started on oxygen and remdesivir.

## 2020-12-22 NOTE — Progress Notes (Signed)
Patient stated MD told her she no longer needed her telemetry monitor on. Patient removed all wires. Asked patient to replace her wires and she stated "no, the doctor said that I don't need this anymore".

## 2020-12-22 NOTE — Assessment & Plan Note (Signed)
--   Risk factor for COVID progression.

## 2020-12-22 NOTE — Assessment & Plan Note (Addendum)
--   Hypoxic (88-92%), therefore being treated with IV steroid.  CXR NAD. Given diagnosis of COVID 5 more days ago will not initiate remdesivir at this time.  She does have risk factors including lung cancer and therefore warrants close observation in the hospital. -- Follow closely for any worsening of oxygen requirement.  Continue zinc and vitamin C.  Continue steroids.

## 2020-12-22 NOTE — Assessment & Plan Note (Signed)
--  continue alprazolam

## 2020-12-22 NOTE — Assessment & Plan Note (Signed)
--  asymptomatic.  Continue Plavix, statin, Imdur.

## 2020-12-22 NOTE — Evaluation (Signed)
Physical Therapy Evaluation Patient Details Name: Michele [name] MRN: [id number] DOB: 06-Jul-1955 Today's Date: 12/22/2020   History of Present Illness  65 yo female with onset of weakness and leg pain from a month ago came to ED, had recent positive Covid test.  Has been falling but not using AD.  PMHx:  small cell CA lung, CAD, MI's, percutaneous angioplasty, HLD, tobacco use, anxiety, RLS, chronic back pain, COPD, GERD, emphysema, cataracts, CKD,  Clinical Impression  Pt is initially assessed for mobility and is reporting her falls seem more related to pain than actual leg weakness.  Her living situation is with help, but will require 24/7 to go safely.  Will recommend her to have home therapy and then can resume outpatient therapy when safer to get out and go.  Follow acutely to work on RW gait, to instruct HEP and to work on stability with pt being aware of what is happening that is leading to her falls.  Goals of PT are outlined below.    Follow Up Recommendations Home health PT;Supervision for mobility/OOB    Equipment Recommendations  Rolling walker with 5" wheels    Recommendations for Other Services       Precautions / Restrictions Precautions Precautions: Fall Precaution Comments: HOH, monitor sats Restrictions Weight Bearing Restrictions: No      Mobility  Bed Mobility Overal bed mobility: Modified Independent             General bed mobility comments: pt is getting herself into and out of bed on R side    Transfers Overall transfer level: Needs assistance Equipment used: 1 person hand held assist Transfers: Sit to/from Stand Sit to Stand: Min guard         General transfer comment: min guard for safety  Ambulation/Gait Ambulation/Gait assistance: Min assist Gait Distance (Feet): 45 Feet Assistive device: 1 person hand held assist Gait Pattern/deviations: Step-to pattern;Step-through pattern;Decreased weight shift to left;Decreased stride length  (lateral instability and one moment of oscillating movement to walk at hip level) Gait velocity: reduced Gait velocity interpretation: <1.31 ft/sec, indicative of household ambulator General Gait Details: Pt is up to walk in room on HHA, noted her balance changes but are able to steady with HHA  Stairs            Wheelchair Mobility    Modified Rankin (Stroke Patients Only)       Balance Overall balance assessment: Needs assistance;History of Falls Sitting-balance support: Feet supported Sitting balance-Leahy Scale: Fair     Standing balance support: Bilateral upper extremity supported;During functional activity Standing balance-Leahy Scale: Fair Standing balance comment: less than fair dynamically                             Pertinent Vitals/Pain Pain Assessment: Faces Faces Pain Scale: Hurts little more Pain Location: thighs and lateral L hip Pain Descriptors / Indicators: Aching;Grimacing Pain Intervention(s): Repositioned;Monitored during session;Premedicated before session    Home Living Family/patient expects to be discharged to:: Private residence Living Arrangements: Spouse/significant other Available Help at Discharge: Family;Available 24 hours/day Type of Home: House       Home Layout: One level Home Equipment: Cane - single point Additional Comments: pt has a cane but has not been using it, but when she did reported it was not supportive    Prior Function Level of Independence: Needs assistance;Independent;Independent with assistive device(s)   Gait / Transfers Assistance Needed: has been using nothing  then [facility] and having many falls  ADL's / Homemaking Assistance Needed: significant other helping with house and meals, but not eating much recently  Comments: lateral instability in gait     Hand Dominance   Dominant Hand: Right    Extremity/Trunk Assessment   Upper Extremity Assessment Upper Extremity Assessment: Overall WFL for  tasks assessed    Lower Extremity Assessment Lower Extremity Assessment: Generalized weakness (proximal weakness the most)    Cervical / Trunk Assessment Cervical / Trunk Assessment: Other exceptions (degenerative changes in spine) Cervical / Trunk Exceptions: L3-4 disc herniation, L 4-5, L5-S1 disc bulge, could be compression on L4 nerve on L  Communication   Communication: HOH  Cognition Arousal/Alertness: Awake/alert Behavior During Therapy: WFL for tasks assessed/performed Overall Cognitive Status: Difficult to assess                                 General Comments: hearing is quite poor and asks for repetition of questions      General Comments General comments (skin integrity, edema, etc.): Pt is struggling to understand PT with PPE in place, but reports her hearing changed previously from chemotherapy    Exercises     Assessment/Plan    PT Assessment Patient needs continued PT services  PT Problem List Decreased strength;Decreased range of motion;Decreased activity tolerance;Decreased balance;Decreased mobility;Decreased coordination;Decreased knowledge of use of DME;Decreased safety awareness;Pain;Cardiopulmonary status limiting activity       PT Treatment Interventions DME instruction;Gait training;Stair training;Functional mobility training;Therapeutic activities;Therapeutic exercise;Balance training;Neuromuscular re-education;Patient/family education;Wheelchair mobility training    PT Goals (Current goals can be found in the Care Plan section)  Acute Rehab PT Goals Patient Stated Goal: to get her pain managed PT Goal Formulation: With patient Time For Goal Achievement: 01/05/21 Potential to Achieve Goals: Good    Frequency Min 3X/week   Barriers to discharge Other (comment) home with 24/7 help    Co-evaluation               AM-PAC PT "6 Clicks" Mobility  Outcome Measure Help needed turning from your back to your side while in a flat  bed without using bedrails?: A Little Help needed moving from lying on your back to sitting on the side of a flat bed without using bedrails?: A Little Help needed moving to and from a bed to a chair (including a wheelchair)?: A Little Help needed standing up from a chair using your arms (e.g., wheelchair or bedside chair)?: A Little Help needed to walk in hospital room?: A Little Help needed climbing 3-5 steps with a railing? : A Lot 6 Click Score: 17    End of Session Equipment Utilized During Treatment: Gait belt Activity Tolerance: Patient limited by fatigue;Treatment limited secondary to medical complications (Comment) Patient left: in bed;with call bell/phone within reach;with bed alarm set Nurse Communication: Mobility status PT Visit Diagnosis: Unsteadiness on feet (R26.81);Muscle weakness (generalized) (M62.81);Repeated falls (R29.6);Difficulty in walking, not elsewhere classified (R26.2);Pain Pain - Right/Left:  (both) Pain - part of body: Hip;Knee    Time: 2202-5427 PT Time Calculation (min) (ACUTE ONLY): 24 min   Charges:   PT Evaluation $PT Eval Moderate Complexity: 1 Mod PT Treatments $Gait Training: 8-22 mins       Ramond Dial 12/22/2020, 1:12 PM  Mee Hives, PT MS Acute Rehab Dept. Number: [place]

## 2020-12-22 NOTE — Assessment & Plan Note (Signed)
--   Chronic.  Lumbar spine did showed disc bulge.  Follow-up with [name] as an outpatient.

## 2020-12-23 DIAGNOSIS — J069 Acute upper respiratory infection, unspecified: Secondary | ICD-10-CM | POA: Diagnosis not present

## 2020-12-23 DIAGNOSIS — U071 COVID-19: Secondary | ICD-10-CM | POA: Diagnosis not present

## 2020-12-23 LAB — CBC WITH DIFFERENTIAL/PLATELET
Abs Immature Granulocytes: 0.02 10*3/uL (ref 0.00–0.07)
Basophils Absolute: 0 10*3/uL (ref 0.0–0.1)
Basophils Relative: 0 %
Eosinophils Absolute: 0 10*3/uL (ref 0.0–0.5)
Eosinophils Relative: 0 %
HCT: 36.6 % (ref 36.0–46.0)
Hemoglobin: 12.5 g/dL (ref 12.0–15.0)
Immature Granulocytes: 0 %
Lymphocytes Relative: 9 %
Lymphs Abs: 0.4 10*3/uL — ABNORMAL LOW (ref 0.7–4.0)
MCH: 31 pg (ref 26.0–34.0)
MCHC: 34.2 g/dL (ref 30.0–36.0)
MCV: 90.8 fL (ref 80.0–100.0)
Monocytes Absolute: 0.2 10*3/uL (ref 0.1–1.0)
Monocytes Relative: 5 %
Neutro Abs: 4 10*3/uL (ref 1.7–7.7)
Neutrophils Relative %: 86 %
Platelets: 127 10*3/uL — ABNORMAL LOW (ref 150–400)
RBC: 4.03 MIL/uL (ref 3.87–5.11)
RDW: 12.8 % (ref 11.5–15.5)
WBC: 4.6 10*3/uL (ref 4.0–10.5)
nRBC: 0 % (ref 0.0–0.2)

## 2020-12-23 LAB — COMPREHENSIVE METABOLIC PANEL
ALT: 16 U/L (ref 0–44)
AST: 29 U/L (ref 15–41)
Albumin: 3.2 g/dL — ABNORMAL LOW (ref 3.5–5.0)
Alkaline Phosphatase: 54 U/L (ref 38–126)
Anion gap: 6 (ref 5–15)
BUN: 22 mg/dL (ref 8–23)
CO2: 28 mmol/L (ref 22–32)
Calcium: 8.9 mg/dL (ref 8.9–10.3)
Chloride: 103 mmol/L (ref 98–111)
Creatinine, Ser: 0.68 mg/dL (ref 0.44–1.00)
GFR, Estimated: >60 mL/min (ref >60)
Glucose, Bld: 149 mg/dL — ABNORMAL HIGH (ref 70–99)
Potassium: 3.6 mmol/L (ref 3.5–5.1)
Sodium: 137 mmol/L (ref 135–145)
Total Bilirubin: 0.4 mg/dL (ref 0.3–1.2)
Total Protein: 5.2 g/dL — ABNORMAL LOW (ref 6.5–8.1)

## 2020-12-23 LAB — PHOSPHORUS: Phosphorus: 2.8 mg/dL (ref 2.5–4.6)

## 2020-12-23 LAB — MAGNESIUM: Magnesium: 2 mg/dL (ref 1.7–2.4)

## 2020-12-23 MED ORDER — ACETAMINOPHEN 325 MG PO TABS: 650.0000 mg | ORAL_TABLET | Freq: Four times a day (QID) | ORAL | 0 refills | Status: AC | PRN

## 2020-12-23 MED ORDER — PREDNISONE 10 MG PO TABS: ORAL_TABLET | ORAL | 0 refills | Status: AC

## 2020-12-23 NOTE — Plan of Care (Signed)
  Problem: Education: Goal: Knowledge of risk factors and measures for prevention of condition will improve Outcome: Adequate for Discharge   Problem: Coping: Goal: Psychosocial and spiritual needs will be supported Outcome: Adequate for Discharge   Problem: Respiratory: Goal: Will maintain a patent airway Outcome: Adequate for Discharge Goal: Complications related to the disease process, condition or treatment will be avoided or minimized Outcome: Adequate for Discharge   

## 2020-12-23 NOTE — Discharge Summary (Signed)
Physician Discharge Summary  Michele [name] DJS:[id number] DOB: 1956-01-30 DOA: 12/20/2020  PCP: [name], MD  Admit date: 12/20/2020 Discharge date: 12/23/2020  Admitted From: Home Disposition: Home  Recommendations for Outpatient Follow-up:  Follow up with PCP in 1-2 weeks Please obtain BMP/CBC in one week Follow-up with cardiology as scheduled  Home Health: Home health PT Equipment/Devices: Rolling walker  Discharge Condition: Stable CODE STATUS: Full Diet recommendation: As tolerated  Brief/Interim Summary: 65 year old woman PMH small cell carcinoma lung, chronic low back pain, bilateral leg pain, presented with report of weakness and pain in her legs acute on chronic.  Also noted some shortness of breath.  Boyfriend and patient tested positive for COVID perhaps a week ago.  Mildly hypoxic in the emergency department, started on oxygen and remdesivir.  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  Continue Plavix, statin,  Imdur.   Tobacco abuse -- Suggest cessation on discharge   LOW BACK PAIN -- Chronic.  Lumbar spine remarkable for disc bulge.  Follow-up with [name] as an outpatient.   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)   CAD S/P percutaneous coronary angioplasty   COVID-19 virus infection    Discharge Instructions  Discharge Instructions     Call MD for:  difficulty breathing, headache or visual disturbances   Complete by: As directed    Call MD for:  severe uncontrolled pain   Complete by: As directed    Diet - low sodium heart healthy   Complete by: As directed    Increase activity slowly   Complete by: As directed       Allergies as of 12/23/2020       Reactions   Ciprofloxacin Other (See Comments)   Hypoglycemia    Aspirin Other (See Comments)   GI upset; patient is only able to take enteric coated Aspirin.     Crestor [rosuvastatin] Other (See Comments)   Muscle pain    Ibuprofen Other (See Comments)   GI upset    Wellbutrin [bupropion] Palpitations   Amitiza [lubiprostone] Other (See Comments)   Abdominal pain   Prednisone Other (See Comments)   Heart palpations   Zoloft [sertraline Hcl] Other (See Comments)   Insomnia, fatigue    Albuterol Palpitations   Lipitor [atorvastatin] Other (See Comments)   Muscle pain  Sulfonamide Derivatives Itching, Rash        Medication List     TAKE these medications    acetaminophen 325 MG tablet Commonly known as: TYLENOL Take 2 tablets (650 mg total) by mouth every 6 (six) hours as needed for mild pain, fever or headache.   ALPRAZolam 1 MG tablet Commonly known as:  XANAX Take 1 mg by mouth 3 (three) times daily.   Arnuity Ellipta 100 MCG/ACT Aepb Generic drug: Fluticasone Furoate Take 1 puff by mouth daily.   CITRACAL + D PO Take 1 tablet by mouth in the morning and at bedtime.   clopidogrel 75 MG tablet Commonly known as: PLAVIX TAKE 1 TABLET BY MOUTH EVERY MONDAY, WEDNESDAY AND FRIDAY What changed: See the new instructions.   co-enzyme Q-10 30 MG capsule Take 30 mg by mouth daily.   fluticasone 50 MCG/ACT nasal spray Commonly known as: FLONASE INSTILL 1 SPRAY INTO BOTH NOSTRILS DAILY   Flutter Devi 1 application by Does not apply route 2 (two) times daily.   furosemide 20 MG tablet Commonly known as: LASIX TAKE 1 TABLET(20 MG) BY MOUTH DAILY What changed: See the new instructions.   gabapentin 100 MG capsule Commonly known as: NEURONTIN Take 100 mg by mouth daily as needed (Leg pain).   isosorbide mononitrate 30 MG 24 hr tablet Commonly known as: IMDUR TAKE 1 TABLET(30 MG) BY MOUTH AT BEDTIME   levalbuterol 0.63 MG/3ML nebulizer solution Commonly known as: Xopenex Take 3 mLs (0.63 mg total) by nebulization every 4 (four) hours as needed for wheezing or shortness of breath (dx: R00.2, J44.9).   levalbuterol 45 MCG/ACT inhaler Commonly known as: XOPENEX HFA INHALE 2 PUFFS INTO THE LUNGS EVERY 6 HOURS AS NEEDED FOR WHEEZING   midodrine 2.5 MG tablet Commonly known as: PROAMATINE Take 2.5 mg by mouth daily.   multivitamin with minerals Tabs tablet Take 1 tablet by mouth daily. Multivitamin for Women 50+   nitroGLYCERIN 0.4 MG SL tablet Commonly known as: Nitrostat Place 1 tablet (0.4 mg total) under the tongue every 5 (five) minutes as needed for chest pain.   nystatin 100000 UNIT/ML suspension Commonly known as: MYCOSTATIN Take 5 mLs (500,000 Units total) by mouth 4 (four) times daily. For thrush symptoms What changed: when to take this   pantoprazole 40 MG tablet Commonly known as: PROTONIX Take 40 mg by mouth  every other day.   polyethylene glycol 17 g packet Commonly known as: MIRALAX / GLYCOLAX Take 17 g by mouth daily as needed for mild constipation.   pravastatin 80 MG tablet Commonly known as: PRAVACHOL Take every Monday - Wednesday-Friday What changed:  how much to take how to take this when to take this additional instructions   predniSONE 10 MG tablet Commonly known as: DELTASONE Take 4 tablets (40 mg total) by mouth daily for 3 days, THEN 3 tablets (30 mg total) daily for 3 days, THEN 2 tablets (20 mg total) daily for 3 days, THEN 1 tablet (10 mg total) daily for 3 days. Start taking on: December 23, 1962   Repatha SureClick 229 MG/ML Soaj Generic drug: Evolocumab Inject 140 mg into the skin every 14 (fourteen) days.   Restasis 0.05 % ophthalmic emulsion Generic drug: cycloSPORINE Place 1 drop into both eyes 2 (two) times daily.   tiotropium 18 MCG inhalation capsule Commonly known as: SPIRIVA Place 18 mcg into inhaler and inhale daily.   Vascepa 1 g capsule Generic drug: icosapent Ethyl TAKE 1 CAPSULE BY MOUTH TWICE  DAILY What changed: how much to take               Durable Medical Equipment  (From admission, onward)           Start     Ordered   12/23/20 0947  DME Gilford Rile  Once       Question Answer Comment  Walker: With 5 Inch Wheels   Patient needs a walker to treat with the following condition Ambulatory dysfunction      12/23/20 0947   12/22/20 1703  For home use only DME Walker rolling  Once       Question Answer Comment  Walker: With 5 Inch Wheels   Patient needs a walker to treat with the following condition Fall      12/22/20 1702            Allergies  Allergen Reactions   Ciprofloxacin Other (See Comments)    Hypoglycemia    Aspirin Other (See Comments)    GI upset; patient is only able to take enteric coated Aspirin.     Crestor [Rosuvastatin] Other (See Comments)    Muscle pain    Ibuprofen Other (See Comments)    GI upset     Wellbutrin [Bupropion] Palpitations   Amitiza [Lubiprostone] Other (See Comments)    Abdominal pain   Prednisone Other (See Comments)    Heart palpations   Zoloft [Sertraline Hcl] Other (See Comments)    Insomnia, fatigue    Albuterol Palpitations   Lipitor [Atorvastatin] Other (See Comments)    Muscle pain    Sulfonamide Derivatives Itching and Rash    Consultations: None   Procedures/Studies: MR LUMBAR SPINE WO CONTRAST  Result Date: 12/21/2020 CLINICAL DATA:  Low back pain. Progressive neurologic deficit. History of small cell lung carcinoma 2017. EXAM: MRI LUMBAR SPINE WITHOUT CONTRAST TECHNIQUE: Multiplanar, multisequence MR imaging of the lumbar spine was performed. No intravenous contrast was administered. COMPARISON:  Radiography 07/16/2014.  CT 09/07/2007. FINDINGS: Segmentation:  5 lumbar type vertebral bodies. Alignment:  No malalignment. Vertebrae: No fracture or focal bone lesion. Insignificant superior endplate Schmorl's node at L2. Conus medullaris and cauda equina: Conus extends to the L1-2 level. Conus and cauda equina appear normal. Paraspinal and other soft tissues: Right renal cyst. Disc levels: Minimal non-compressive disc bulges at L2-3 and above. No canal or foraminal stenosis. L3-4: Broad-based disc herniation more prominent in the left posterolateral direction. Stenosis of the left lateral recess that could compress the left L4 nerve. L4-5: Mild bulging of the disc. Mild facet and ligamentous prominence. Mild narrowing of the lateral recesses without visible neural compression. L5-S1: Mild bulging of the disc. Mild facet hypertrophy. No stenosis. IMPRESSION: L3-4: Broad-based disc herniation more prominent in the left posterolateral direction. Stenosis of the left lateral recess that could compress the left L4 nerve. Mild, non-compressive disc bulges at the other levels as outlined above. Notable absence of discogenic endplate marrow edema or edematous facet  arthropathy. Electronically Signed   By: Nelson Chimes M.D.   On: 12/21/2020 20:24   CT Abdomen Pelvis W Contrast  Result Date: 12/20/2020 CLINICAL DATA:  Diverticulitis suspected. bilateral thigh pain x4 days. States pain has been so severe that she is unable to sleep at night. Pt reports she is having difficulty walking. EXAM: CT ABDOMEN AND PELVIS WITH CONTRAST TECHNIQUE: Multidetector CT imaging of the abdomen and pelvis was performed using the standard protocol following bolus administration of intravenous contrast. CONTRAST:  34mL OMNIPAQUE IOHEXOL 350  MG/ML SOLN COMPARISON:  Abdomen pelvis 01/07/2020 FINDINGS: Lower chest: Centrilobular emphysematous changes.  Hiatal hernia. Hepatobiliary: No focal liver abnormality. No gallstones, gallbladder wall thickening, or pericholecystic fluid. No biliary dilatation. Pancreas: No focal lesion. Normal pancreatic contour. No surrounding inflammatory changes. No main pancreatic ductal dilatation. Spleen: Normal in size without focal abnormality. Adrenals/Urinary Tract: No adrenal nodule bilaterally. Bilateral kidneys enhance symmetrically. There is a 1.9 cm fluid density lesion right kidney likely represents renal cyst. No hydronephrosis. No hydroureter. The urinary bladder is unremarkable. On delayed imaging, there is no urothelial wall thickening and there are no filling defects in the opacified portions of the bilateral collecting systems or ureters. Stomach/Bowel: Stomach is within normal limits. No evidence of bowel wall thickening or dilatation. No pneumatosis. Appendix appears normal. 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 No abdominal aorta or iliac aneurysm. Severe calcified and atherosclerotic plaque of the aorta and its branches. No abdominal, pelvic, or inguinal lymphadenopathy. Reproductive: Uterus and bilateral adnexa are unremarkable. Other: No intraperitoneal free fluid. No intraperitoneal free gas. No organized  fluid collection. Musculoskeletal: No abdominal wall hernia or abnormality. No suspicious lytic or blastic osseous lesions. No acute displaced fracture. Multilevel degenerative changes of the spine. IMPRESSION: 1. Small hiatal hernia. 2. No acute intra-abdominal or intrapelvic abnormality. 3. Aortic Atherosclerosis (ICD10-I70.0) and Emphysema (ICD10-J43.9). Electronically Signed   By: Iven Finn M.D.   On: 12/20/2020 22:32   DG Chest Port 1 View  Result Date: 12/20/2020 CLINICAL DATA:  COVID positive. EXAM: PORTABLE CHEST 1 VIEW COMPARISON:  Radiograph 09/12/2020, CT 09/22/2020 FINDINGS: Stable radiographic appearance of the chest. No acute airspace disease. Emphysema, advanced in the right upper lobe. Stable heart size and mediastinal contours. Aortic atherosclerosis and coronary artery calcifications are stent is seen. No pneumothorax, pulmonary edema, or pleural effusion. No acute osseous abnormalities are seen. IMPRESSION: Emphysema without superimposed acute process. Electronically Signed   By: Keith Rake M.D.   On: 12/20/2020 19:31     Subjective: No acute issues or events overnight denies nausea vomiting diarrhea constipation any fevers or chills.  No further dyspnea with exertion, leg pain ongoing but minimally improving   Discharge Exam: Vitals:   12/23/20 0811 12/23/20 1236  BP: 126/68 (!) 118/50  Pulse: 71 61  Resp: 18 18  Temp: 97.7 F (36.5 C) 97.7 F (36.5 C)  SpO2: 92%    Vitals:   12/23/20 0539 12/23/20 0719 12/23/20 0811 12/23/20 1236  BP: (!) 101/58  126/68 (!) 118/50  Pulse:   71 61  Resp: 16  18 18   Temp: (!) 97.2 F (36.2 C)  97.7 F (36.5 C) 97.7 F (36.5 C)  TempSrc: Oral  Oral Oral  SpO2: 91% 92% 92%   Weight:      Height:        General: Pt is alert, awake, not in acute distress Cardiovascular: RRR, S1/S2 +, no rubs, no gallops Respiratory: CTA bilaterally, no wheezing, no rhonchi Abdominal: Soft, NT, ND, bowel sounds + Extremities: no  edema, no cyanosis    The results of significant diagnostics from this hospitalization (including imaging, microbiology, ancillary and laboratory) are listed below for reference.     Microbiology: Recent Results (from the past 240 hour(s))  Resp Panel by RT-PCR (Flu A&B, Covid) Nasopharyngeal Swab     Status: Abnormal   Collection Time: 12/20/20  4:02 PM   Specimen: Nasopharyngeal Swab; Nasopharyngeal(NP) swabs in vial transport medium  Result Value Ref Range Status   SARS Coronavirus 2  by RT PCR POSITIVE (A) NEGATIVE Final    Comment: RESULT CALLED TO, READ BACK BY AND VERIFIED WITH: TIM SMITH RN [id number] 12/20/2020 [place]  (NOTE) SARS-CoV-2 target nucleic acids are DETECTED.  The SARS-CoV-2 RNA is generally detectable in upper respiratory specimens during the acute phase of infection. Positive results are indicative of the presence of the identified virus, but do not rule out bacterial infection or co-infection with other pathogens not detected by the test. Clinical correlation with patient history and other diagnostic information is necessary to determine patient infection status. The expected result is Negative.  Fact Sheet for Patients: EntrepreneurPulse.com.au  Fact Sheet for Healthcare Providers: IncredibleEmployment.be  This test is not yet approved or cleared by the [place] FDA and  has been authorized for detection and/or diagnosis of SARS-CoV-2 by FDA under an Emergency Use Authorization (EUA).  This EUA will remain in effect (meaning this test can be  used) for the duration of  the COVID-19 declaration under Section 564(b)(1) of the Act, 21 U.S.C. section 360bbb-3(b)(1), unless the authorization is terminated or revoked sooner.     Influenza A by PCR NEGATIVE NEGATIVE Final   Influenza B by PCR NEGATIVE NEGATIVE Final    Comment: (NOTE) The Xpert Xpress SARS-CoV-2/FLU/RSV plus assay is intended as an aid in the diagnosis of  influenza from Nasopharyngeal swab specimens and should not be used as a sole basis for treatment. Nasal washings and aspirates are unacceptable for Xpert Xpress SARS-CoV-2/FLU/RSV testing.  Fact Sheet for Patients: EntrepreneurPulse.com.au  Fact Sheet for Healthcare Providers: IncredibleEmployment.be  This test is not yet approved or cleared by the [place] FDA and has been authorized for detection and/or diagnosis of SARS-CoV-2 by FDA under an Emergency Use Authorization (EUA). This EUA will remain in effect (meaning this test can be used) for the duration of the COVID-19 declaration under Section 564(b)(1) of the Act, 21 U.S.C. section 360bbb-3(b)(1), unless the authorization is terminated or revoked.  Performed at [facility], [address] [place], [place] [address]      Labs: BNP (last 3 results) No results for input(s): BNP in the last 8760 hours. Basic Metabolic Panel: Recent Labs  Lab 12/20/20 1840 12/22/20 0038 12/23/20 0036  NA 134* 136 137  K 3.6 3.6 3.6  CL 97* 99 103  CO2 24 28 28   GLUCOSE 115* 207* 149*  BUN 14 14 22   CREATININE 0.57 0.62 0.68  CALCIUM 9.2 9.2 8.9  MG  --  2.0 2.0  PHOS  --  3.0 2.8   Liver Function Tests: Recent Labs  Lab 12/20/20 1840 12/22/20 0038 12/23/20 0036  AST 26 31 29   ALT 11 17 16   ALKPHOS 62 52 54  BILITOT 0.4 0.7 0.4  PROT 6.8 5.9* 5.2*  ALBUMIN 4.4 3.6 3.2*   No results for input(s): LIPASE, AMYLASE in the last 168 hours. No results for input(s): AMMONIA in the last 168 hours. CBC: Recent Labs  Lab 12/20/20 1840 12/22/20 0038 12/23/20 0036  WBC 3.7* 2.1* 4.6  NEUTROABS 2.8 1.8 4.0  HGB 14.5 13.4 12.5  HCT 42.4 39.8 36.6  MCV 89.5 90.2 90.8  PLT 136* 126* 127*   Cardiac Enzymes: Recent Labs  Lab 12/21/20 1637  CKTOTAL 271*   BNP: Invalid input(s): POCBNP CBG: No results for input(s): GLUCAP in the last 168 hours. D-Dimer Recent  Labs    12/21/20 1841  DDIMER 0.56*   Hgb A1c No results for input(s): HGBA1C in the last 72 hours.  Lipid Profile No results for input(s): CHOL, HDL, LDLCALC, TRIG, CHOLHDL, LDLDIRECT in the last 72 hours. Thyroid function studies No results for input(s): TSH, T4TOTAL, T3FREE, THYROIDAB in the last 72 hours.  Invalid input(s): FREET3 Anemia work up Recent Labs    12/21/20 1841  FERRITIN 233   Urinalysis    Component Value Date/Time   COLORURINE YELLOW 12/21/2020 [place] 12/21/2020 1141   LABSPEC >1.046 (H) 12/21/2020 1141   LABSPEC 1.005 03/08/2016 0958   PHURINE 6.0 12/21/2020 1141   GLUCOSEU NEGATIVE 12/21/2020 1141   GLUCOSEU NEGATIVE 01/21/2019 1643   GLUCOSEU Negative 03/08/2016 0958   HGBUR NEGATIVE 12/21/2020 1141   HGBUR negative 06/20/2010 1357   BILIRUBINUR NEGATIVE 12/21/2020 1141   BILIRUBINUR Negative 03/08/2016 0958   KETONESUR 40 (A) 12/21/2020 1141   PROTEINUR 30 (A) 12/21/2020 1141   UROBILINOGEN 0.2 01/21/2019 1643   UROBILINOGEN 0.2 03/08/2016 0958   NITRITE NEGATIVE 12/21/2020 1141   LEUKOCYTESUR NEGATIVE 12/21/2020 1141   LEUKOCYTESUR Negative 03/08/2016 0958   Sepsis Labs Invalid input(s): PROCALCITONIN,  WBC,  LACTICIDVEN Microbiology Recent Results (from the past 240 hour(s))  Resp Panel by RT-PCR (Flu A&B, Covid) Nasopharyngeal Swab     Status: Abnormal   Collection Time: 12/20/20  4:02 PM   Specimen: Nasopharyngeal Swab; Nasopharyngeal(NP) swabs in vial transport medium  Result Value Ref Range Status   SARS Coronavirus 2 by RT PCR POSITIVE (A) NEGATIVE Final    Comment: RESULT CALLED TO, READ BACK BY AND VERIFIED WITH: TIM SMITH RN [id number] 12/20/2020 [place]  (NOTE) SARS-CoV-2 target nucleic acids are DETECTED.  The SARS-CoV-2 RNA is generally detectable in upper respiratory specimens during the acute phase of infection. Positive results are indicative of the presence of the identified virus, but do not rule out bacterial  infection or co-infection with other pathogens not detected by the test. Clinical correlation with patient history and other diagnostic information is necessary to determine patient infection status. The expected result is Negative.  Fact Sheet for Patients: EntrepreneurPulse.com.au  Fact Sheet for Healthcare Providers: IncredibleEmployment.be  This test is not yet approved or cleared by the [place] FDA and  has been authorized for detection and/or diagnosis of SARS-CoV-2 by FDA under an Emergency Use Authorization (EUA).  This EUA will remain in effect (meaning this test can be  used) for the duration of  the COVID-19 declaration under Section 564(b)(1) of the Act, 21 U.S.C. section 360bbb-3(b)(1), unless the authorization is terminated or revoked sooner.     Influenza A by PCR NEGATIVE NEGATIVE Final   Influenza B by PCR NEGATIVE NEGATIVE Final    Comment: (NOTE) The Xpert Xpress SARS-CoV-2/FLU/RSV plus assay is intended as an aid in the diagnosis of influenza from Nasopharyngeal swab specimens and should not be used as a sole basis for treatment. Nasal washings and aspirates are unacceptable for Xpert Xpress SARS-CoV-2/FLU/RSV testing.  Fact Sheet for Patients: EntrepreneurPulse.com.au  Fact Sheet for Healthcare Providers: IncredibleEmployment.be  This test is not yet approved or cleared by the [place] FDA and has been authorized for detection and/or diagnosis of SARS-CoV-2 by FDA under an Emergency Use Authorization (EUA). This EUA will remain in effect (meaning this test can be used) for the duration of the COVID-19 declaration under Section 564(b)(1) of the Act, 21 U.S.C. section 360bbb-3(b)(1), unless the authorization is terminated or revoked.  Performed at [facility], [address] [place], [place] [address]      Time coordinating discharge: Over 75  minutes  SIGNED:   Little Ishikawa, DO [facility] 12/23/2020, 3:08 PM Pager   If 7PM-7AM, please contact night-coverage [email or web address]

## 2020-12-23 NOTE — TOC Transition Note (Signed)
Transition of Care [facility]) - CM/SW Discharge Note   Patient Details  Name: Michele [name] MRN: [id number] Date of Birth: Jul 03, 1955  [facility] CM/SW Contact:  Carles Collet, RN Phone Number: 12/23/2020, 11:18 AM   Clinical Narrative:    [name] w patient over the phone.  She declines [facility] services. She wishes to follow up with her ortho office. CM explained  to her that there will likely be a delay in services due to her +COVID test on 7/20 and HH could see her sooner. She still declined [facility] services and wishes to schedule her own follow up for PT. No DME needs, no other CM needs identified.     Final next level of care: Home/Self Care Barriers to Discharge: No Barriers Identified   Patient Goals and CMS Choice Patient states their goals for this hospitalization and ongoing recovery are:: to go home   Choice offered to / list presented to : Patient  Discharge Placement                       Discharge Plan and Services                DME Arranged: N/A         HH Arranged: Patient Refused [facility]          Social Determinants of Health (SDOH) Interventions     Readmission Risk Interventions No flowsheet data found.

## 2020-12-26 ENCOUNTER — Other Ambulatory Visit (HOSPITAL_COMMUNITY): Payer: Medicare HMO

## 2020-12-26 ENCOUNTER — Encounter (HOSPITAL_COMMUNITY): Payer: Self-pay | Admitting: Family

## 2020-12-27 ENCOUNTER — Telehealth: Payer: Self-pay | Admitting: Cardiology

## 2020-12-27 NOTE — Telephone Encounter (Signed)
Pt c/o medication issue:  1. Name of Medication:   clopidogrel (PLAVIX) 75 MG tablet pravastatin (PRAVACHOL) 80 MG tablet  2. How are you currently taking this medication (dosage and times per day)?  Patient states she takes 1 tablet on Monday/Wednesday/Friday's every week She takes both medications the same.  3. Are you having a reaction (difficulty breathing--STAT)?  No   4. What is your medication issue?   Patient states on her visit summary from 12/06/20 with Laurann [place], NP, we noted that she takes her medication incorrectly. She would like a call back to clarify how she needs to be taking her medication.

## 2020-12-27 NOTE — Telephone Encounter (Signed)
Called patient back about her medications. Informed patient that her plavix was switched to 3 times a week on 04/27/19 due to bruising. Patient stated that when she was in the hospital they told her she was taking it wrong. Informed patient that a message would be sent to Dr. [name] to revisit medication change and to see if he wants her to increase it or see if she is fine taking it 3 times a week.

## 2020-12-27 NOTE — Telephone Encounter (Signed)
Called spoke patient. Informed patient to continue taking  Clopidogrel  Monday- Wednesday- Friday ( see office note 04/27/19) and  Pravastatin  Monday- Wednesday-Friday (telephone note from 06/16/20) .

## 2020-12-28 ENCOUNTER — Other Ambulatory Visit: Payer: Self-pay | Admitting: Cardiology

## 2020-12-28 ENCOUNTER — Telehealth: Payer: Self-pay | Admitting: Cardiology

## 2020-12-28 IMAGING — CT CT CHEST WITH CONTRAST
2 of 4 series · 15 of 36 positions shown, 18 images · IV contrast (OMNIPAQUE)
Comparison: 03/02/2018

CLINICAL DATA: History of lung cancer.  Follow-up.

EXAM:
CT CHEST WITH CONTRAST
TECHNIQUE: Multidetector CT imaging of the chest was performed during
intravenous contrast administration.
CONTRAST:  75mL OMNIPAQUE IOHEXOL 300 MG/ML  SOLN

[Series 2: axial st · axial · 0.62mm/px · z∈[-293,-35]mm · 12 of 151 slices shown, 15 images]
[im 11/151  mediastinal]
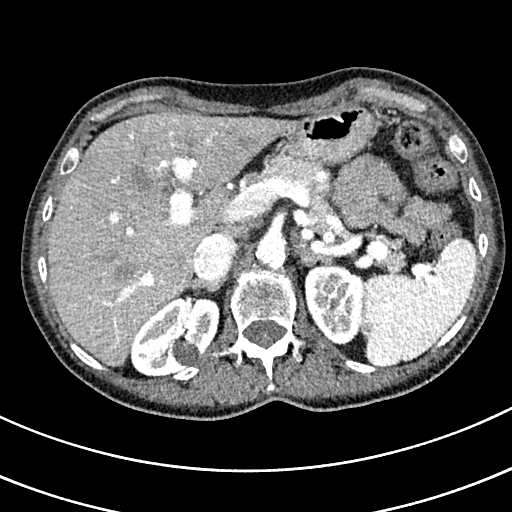
[im 11/151  lung]
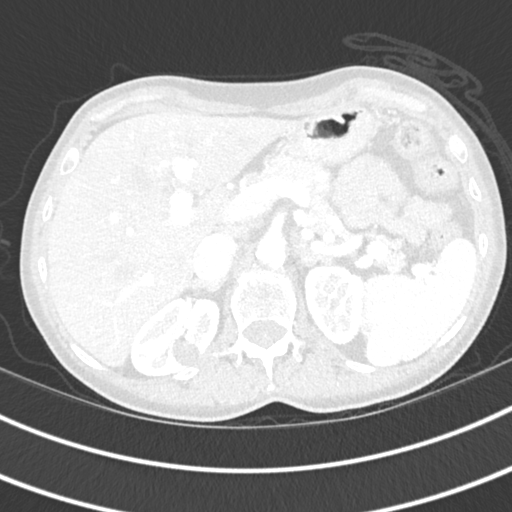
[im 22/151  lung]
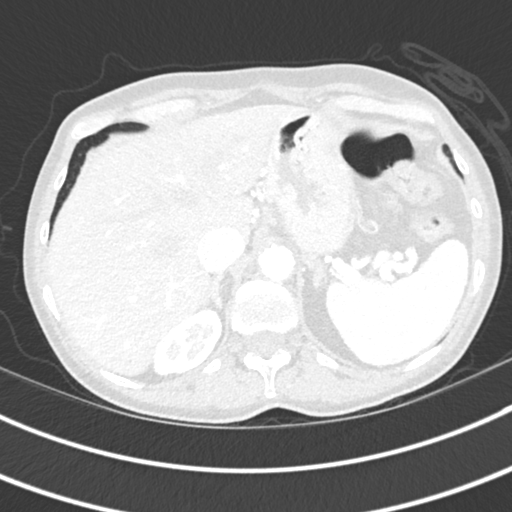
[im 33/151  lung]
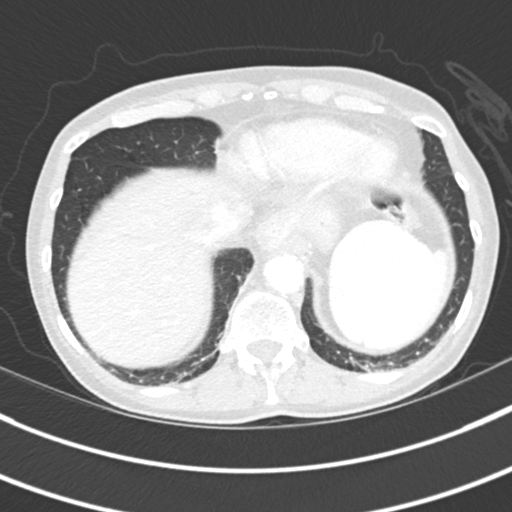
[im 43/151  lung]
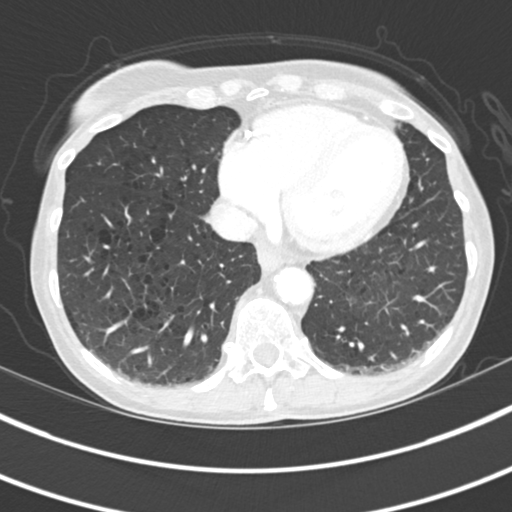
[im 54/151  mediastinal]
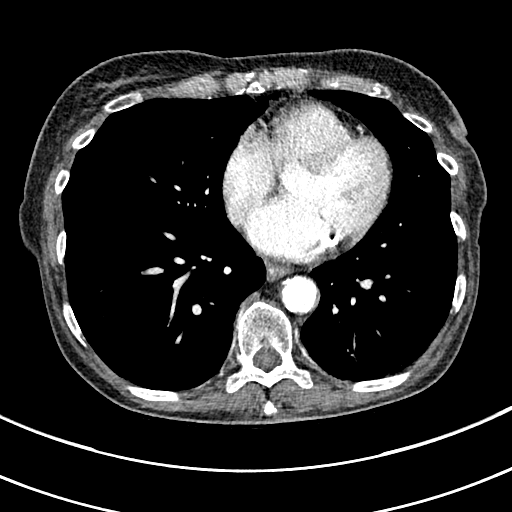
[im 54/151  lung]
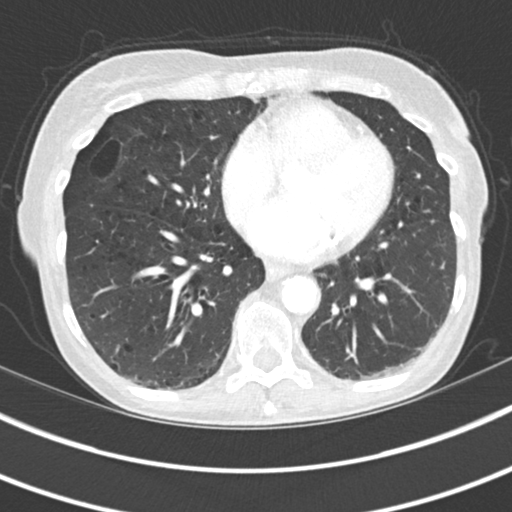
[im 65/151  lung]
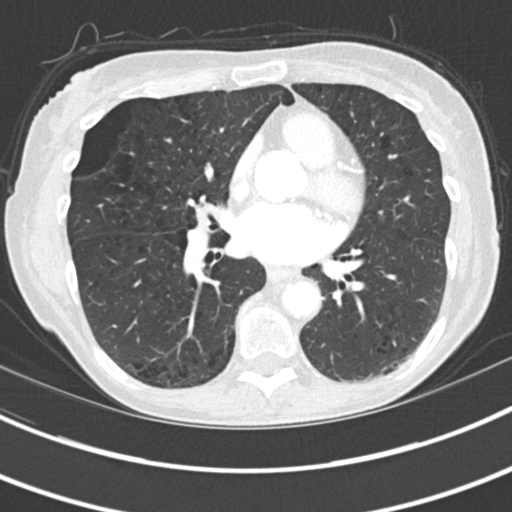
[im 86/151  lung]
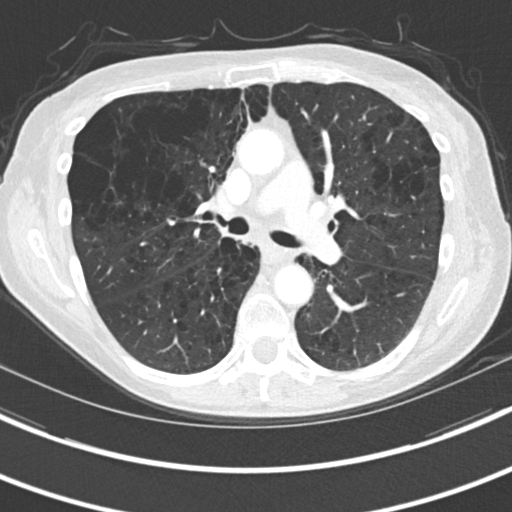
[im 97/151  lung]
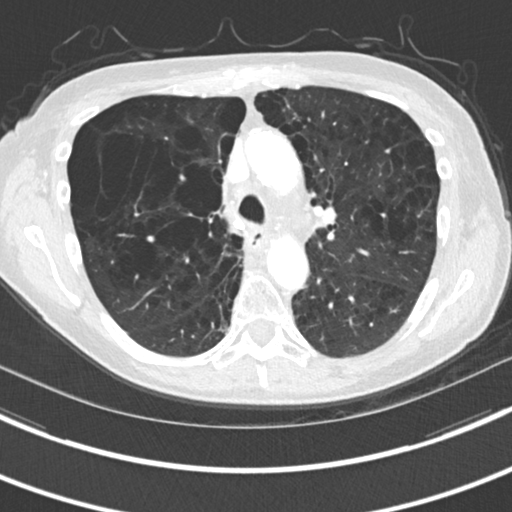
[im 108/151  mediastinal]
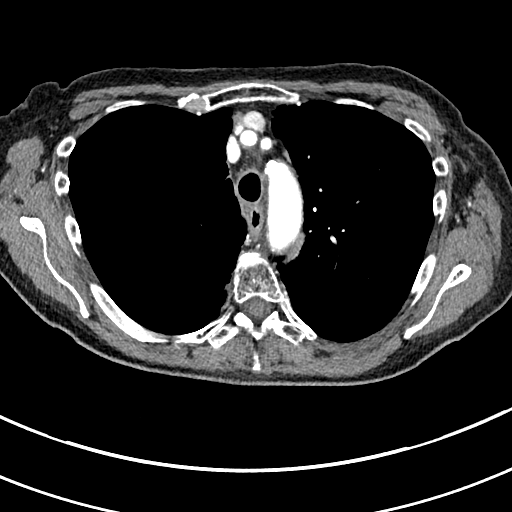
[im 108/151  lung]
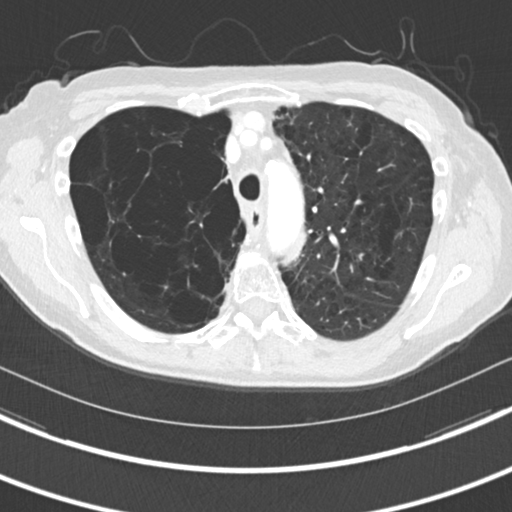
[im 118/151  lung]
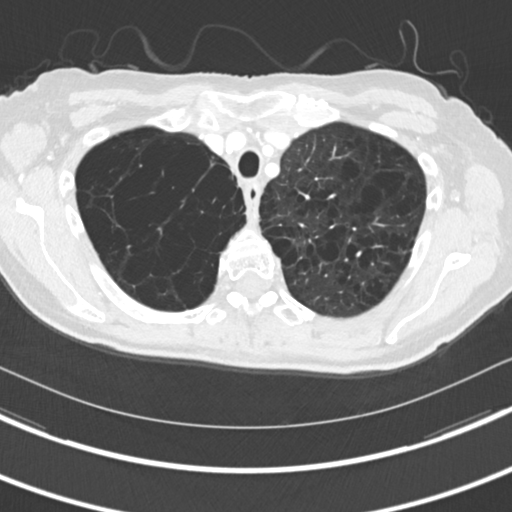
[im 129/151  lung]
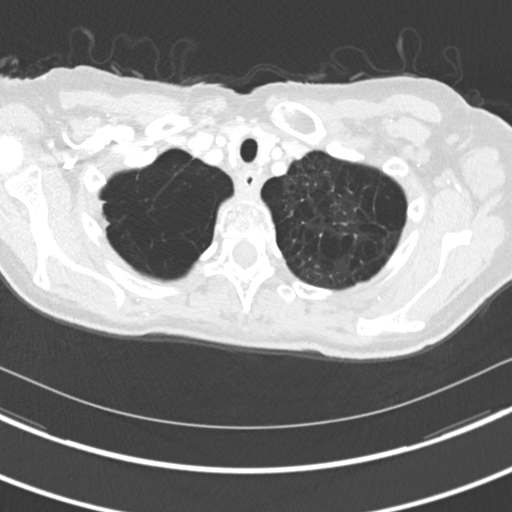
[im 140/151  lung]
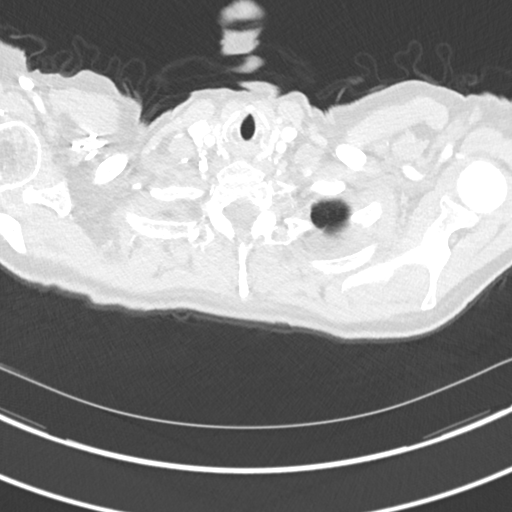

[Series 5: coronal · coronal · 0.61mm/px · 3 of 130 slices shown]
[im 26/130  lung]
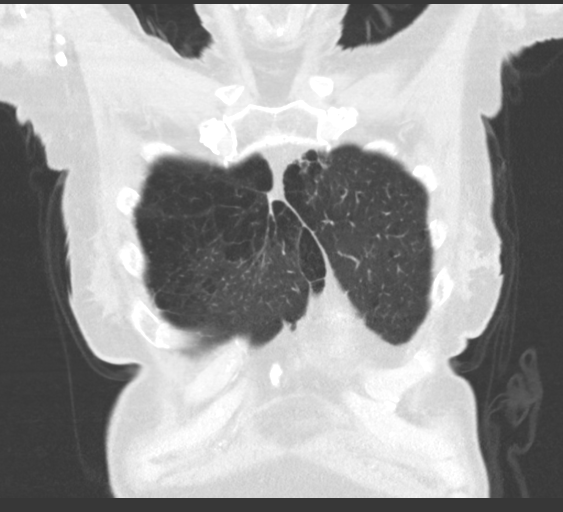
[im 52/130  lung]
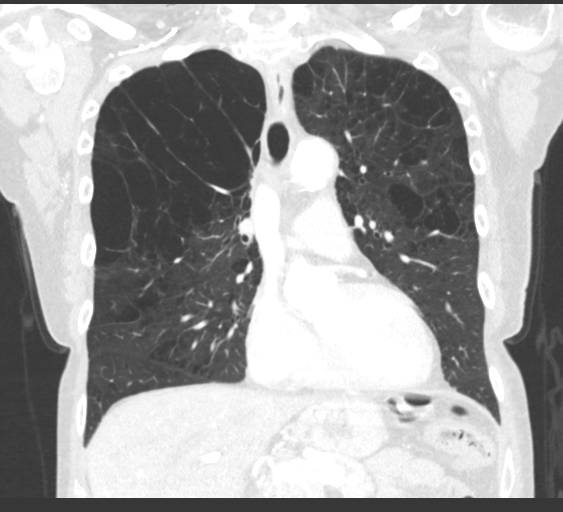
[im 78/130  lung]
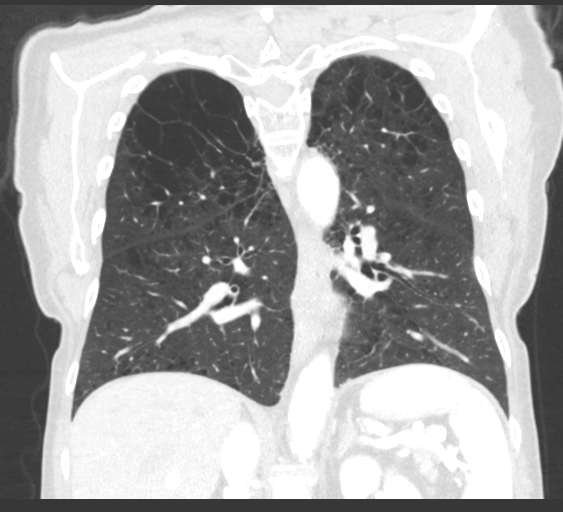

[15 of 36 positions shown; findings below may reference images not displayed]

FINDINGS: Cardiovascular: The heart size is normal. No pericardial effusion.
Aortic atherosclerosis. Calcifications within the RCA, LAD and left
circumflex coronary arteries noted.

Mediastinum/Nodes: Normal appearance of the thyroid gland. The
trachea appears patent and is midline. Normal appearance of the
esophagus. No enlarged axillary, supraclavicular, mediastinal or
hilar adenopathy

Lungs/Pleura: Centrilobular and paraseptal emphysema. Calcified
granulomas identified in the right middle lobe. No suspicious
pulmonary nodule or mass identified.

Upper Abdomen: No acute abnormality.

Musculoskeletal: No chest wall abnormality. No acute or significant
osseous findings.
IMPRESSION: 1. No findings to suggest recurrent or metastatic disease.
2. Aortic Atherosclerosis (EB489-EMS.S) and Emphysema (EB489-PCK.8).
Multi vessel coronary artery calcifications.

## 2020-12-28 NOTE — Telephone Encounter (Signed)
Thanks for clarifying the note from the cardiologist.   Glenetta Hew, MD

## 2020-12-28 NOTE — Telephone Encounter (Signed)
Returned call to pt she states she is experiences frequent urination and increased BP and HR. BP has been running 143/78 HR 100 this afternoon. @ 11am today 142/74 HR 92. Informed pt that lasix will cause increased urination because it is getting the fluid out. She would like to stop the lasix. Informed pt that this is taking the fluid off. Pt states that she is taking prednisone taper. She states that she has 3 more days left of the prednisone. BP hay be high due to the prednisone also. Informed pt that these are not too high but to continue to monitor. She states that she is not having any sx with the increased HR. She would like to forward to Dr [name] for further advise.

## 2020-12-28 NOTE — Telephone Encounter (Signed)
Pt c/o medication issue:  1. Name of Medication: furosemide (LASIX) 20 MG tablet  2. How are you currently taking this medication (dosage and times per day)TAKE 1 TABLET(20 MG) BY MOUTH DAILY  3. Are you having a reaction (difficulty breathing--STAT)? Frequent urination, high blood pressure   4. What is your medication issue? frequent urination and blood pressure is up.

## 2020-12-28 NOTE — Telephone Encounter (Signed)
Blood pressures at that level are okay probably is related to prednisone.  Furosemide was because she had swelling.  It does indeed cause diuresis and that is frequent urination.  If the swelling is gone, then she can go to taking it only as needed if swelling comes on.  Glenetta Hew, MD

## 2020-12-29 NOTE — Telephone Encounter (Signed)
Pt informed of providers result & recommendations. Pt verbalized understanding. All questions answered. She will continue lasix abd will take BP PRN. She will call back if needed

## 2021-01-09 ENCOUNTER — Telehealth (HOSPITAL_COMMUNITY): Payer: Self-pay | Admitting: Family

## 2021-01-09 NOTE — Telephone Encounter (Signed)
Just an FYI. We have made several attempts to contact this patient including sending a letter to schedule or reschedule their echocardiogram. We will be removing the patient from the echo WQ.  12/26/20 NO SHOWED-MAILED LETTER [facility]      Thank you

## 2021-01-24 ENCOUNTER — Telehealth: Payer: Self-pay | Admitting: Cardiology

## 2021-01-24 DIAGNOSIS — I25118 Atherosclerotic heart disease of native coronary artery with other forms of angina pectoris: Secondary | ICD-10-CM

## 2021-01-24 DIAGNOSIS — I5189 Other ill-defined heart diseases: Secondary | ICD-10-CM

## 2021-01-24 NOTE — Telephone Encounter (Signed)
Spoke with the patient and have rescheduled her echocardiogram

## 2021-01-24 NOTE — Telephone Encounter (Signed)
Pt is calling in regards to getting another Authorization for the missed Echo appt on 12/26/20.

## 2021-01-25 IMAGING — MR MRI HEAD WITHOUT AND WITH CONTRAST
11 series · 48 of 48 positions shown · IV contrast (Multihance 10ml)
Comparison: 03/30/2018 and multiple previous studies as distant as
06/12/2016

CLINICAL DATA: Small cell lung cancer treated with whole brain
radiation. Completed July 2016. Negative study on 03/30/2018.

EXAM:
MRI HEAD WITHOUT AND WITH CONTRAST
TECHNIQUE: Multiplanar, multiecho pulse sequences of the brain and surrounding
structures were obtained without and with intravenous contrast.
CONTRAST:  10mL MULTIHANCE GADOBENATE DIMEGLUMINE 529 MG/ML IV SOLN

[Series 2: FLAIR · sagittal · 3.0mm · 0.75mm/px · 2 of 37 slices shown (1 of 2)]
[im 1/37]
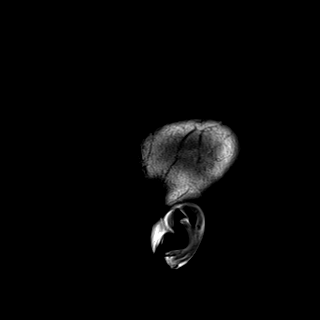
[im 37/37]
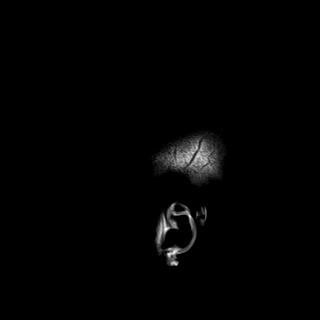

[Series 3: DWI · axial · 3.0mm · 1.50mm/px · z∈[-80,+69]mm · 5 of 78 slices shown (1 of 2)]
[im 1/78]
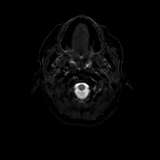
[im 20/78]
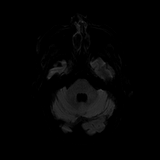
[im 39/78]
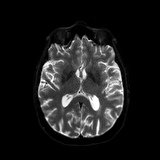
[im 58/78]
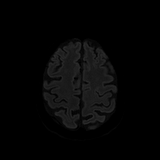
[im 78/78]
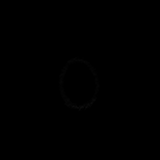

[Series 4: DWI · axial · 3.0mm · 1.50mm/px · z∈[-80,+69]mm · 2 of 39 slices shown (2 of 2)]
[im 1/39]
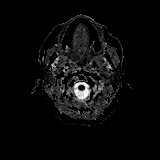
[im 39/39]
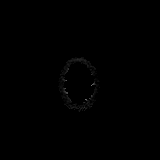

[Series 5: T2 · axial · 5.0mm · 0.57mm/px · z∈[-84,+72]mm · 2 of 27 slices shown]
[im 1/27]
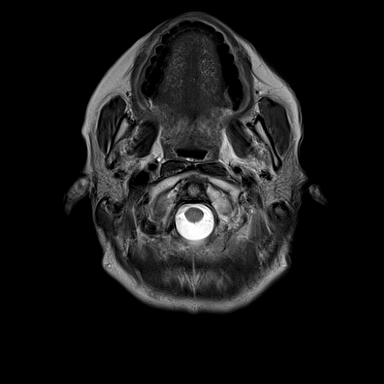
[im 27/27]
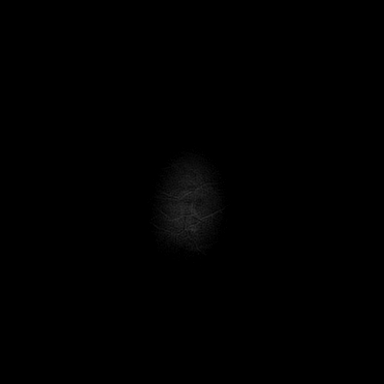

[Series 7: swi_images · axial · 1.5mm · 0.90mm/px · z∈[-77,+66]mm · 6 of 96 slices shown]
[im 1/96]
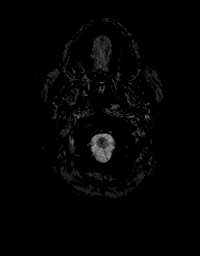
[im 20/96]
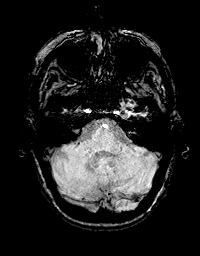
[im 39/96]
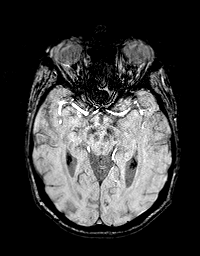
[im 58/96]
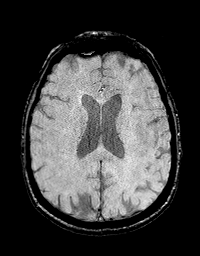
[im 77/96]
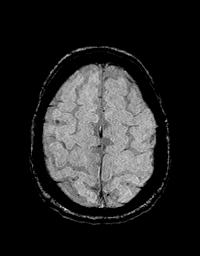
[im 96/96]
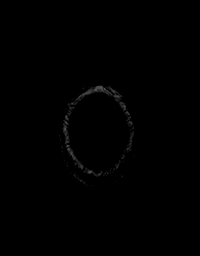

[Series 8: FLAIR · axial · 3.0mm · 0.57mm/px · z∈[-82,+71]mm · 3 of 52 slices shown (2 of 2)]
[im 1/52]
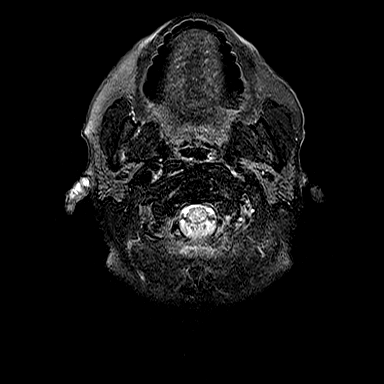
[im 26/52]
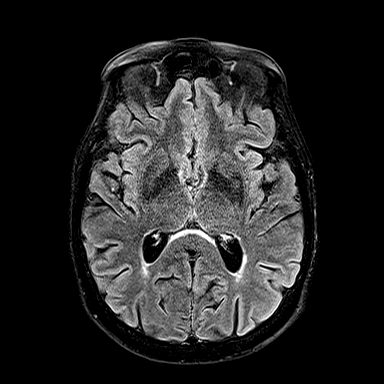
[im 52/52]
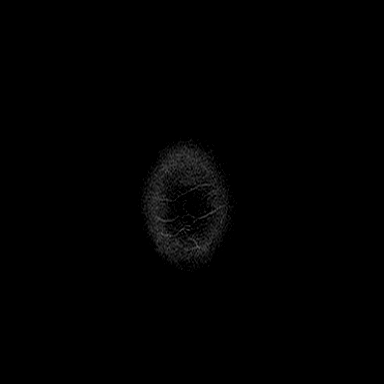

[Series 9: T1 · axial · 1.0mm · 0.75mm/px · z∈[-85,+74]mm · 10 of 160 slices shown]
[im 1/160]
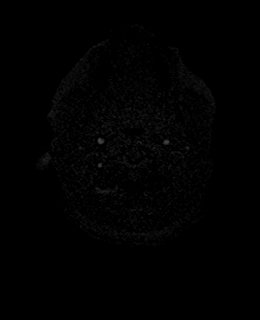
[im 18/160]
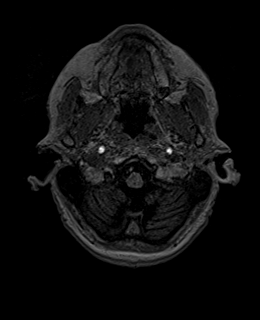
[im 36/160]
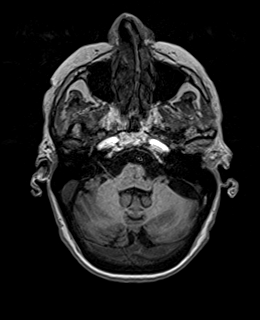
[im 54/160]
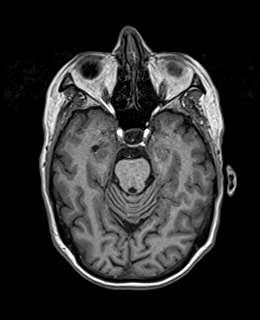
[im 71/160]
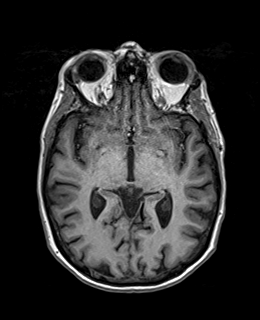
[im 89/160]
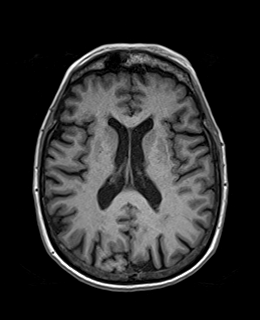
[im 107/160]
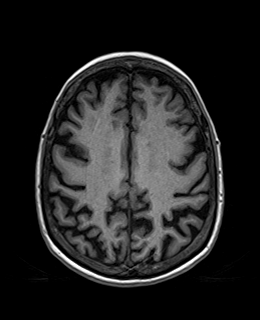
[im 124/160]
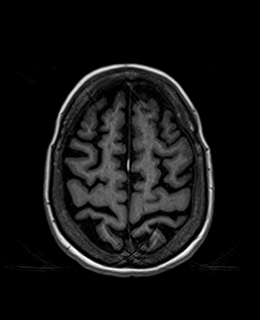
[im 142/160]
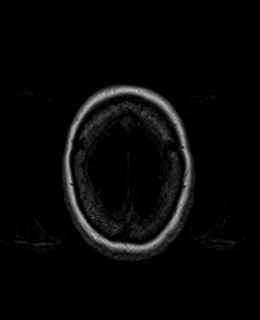
[im 160/160]
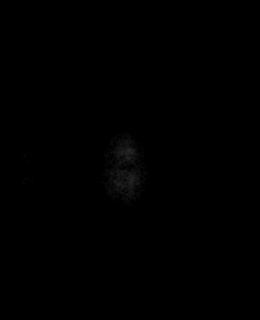

[Series 10: T2 post-contrast · coronal · 3.0mm · 0.57mm/px · 3 of 47 slices shown]
[im 1/47]
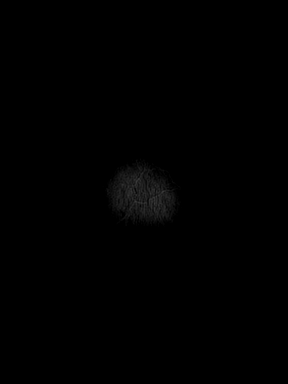
[im 24/47]
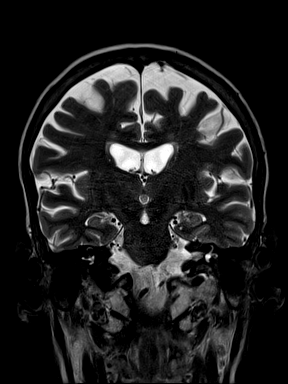
[im 47/47]
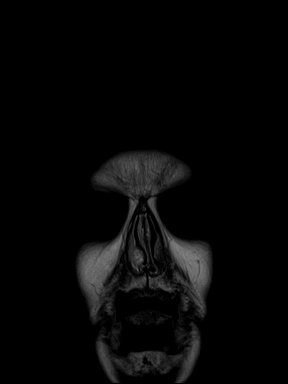

[Series 11: T1 post-contrast · axial · 1.0mm · 0.75mm/px · z∈[-85,+74]mm · 10 of 160 slices shown (1 of 2)]
[im 1/160]
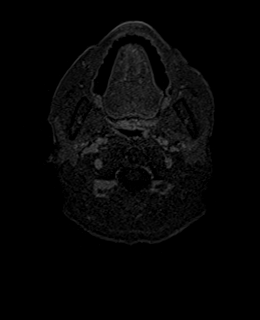
[im 18/160]
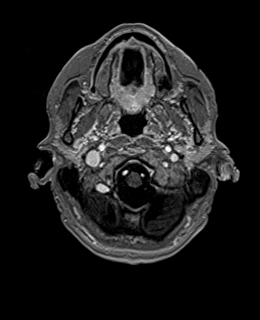
[im 36/160]
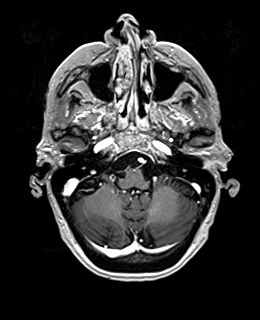
[im 54/160]
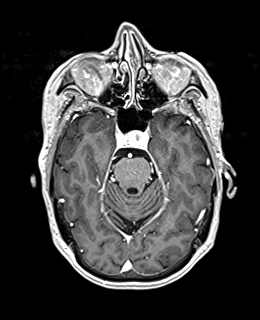
[im 71/160]
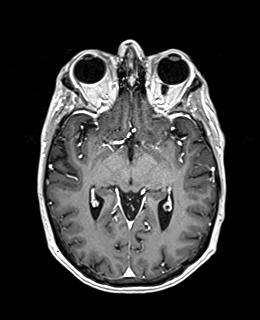
[im 89/160]
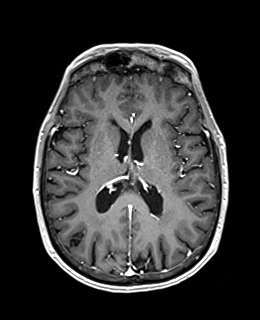
[im 107/160]
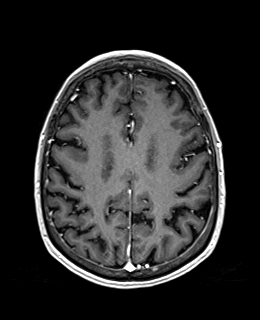
[im 124/160]
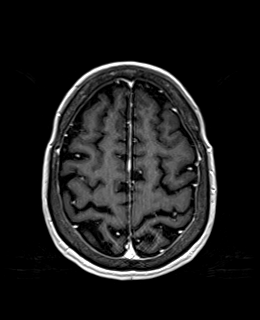
[im 142/160]
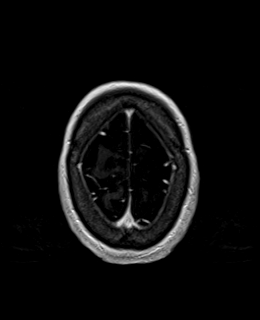
[im 160/160]
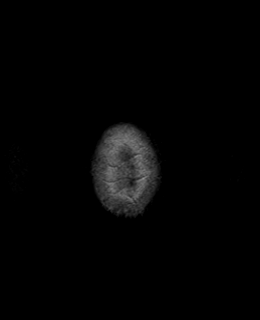

[Series 12: T1 post-contrast · coronal · 3.0mm · 0.57mm/px · 3 of 47 slices shown (2 of 2)]
[im 1/47]
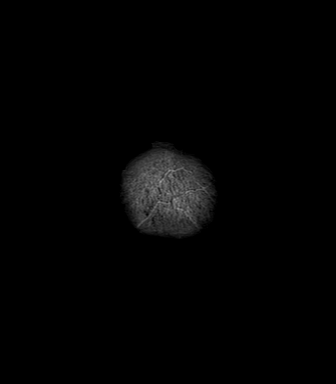
[im 24/47]
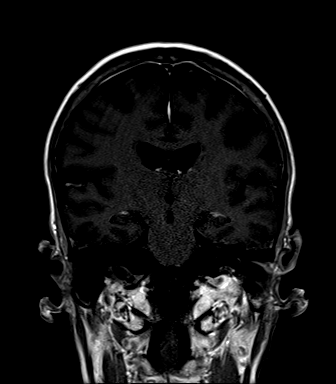
[im 47/47]
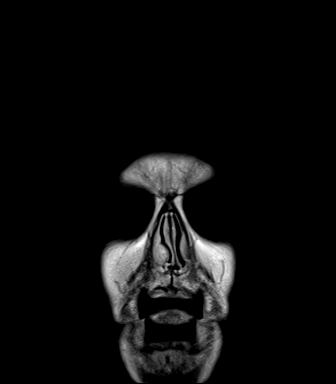

[Series 13: FLAIR post-contrast · sagittal · 3.0mm · 0.75mm/px · 2 of 37 slices shown]
[im 1/37]
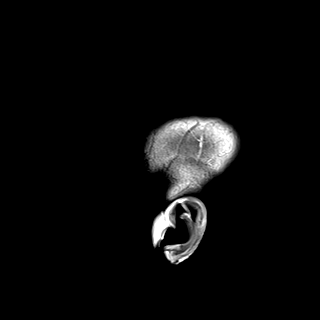
[im 37/37]
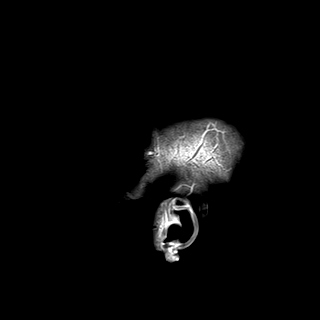

[48 of 48 positions shown; findings below may reference images not displayed]

FINDINGS: Brain: Mild generalized atrophy. No evidence of acute or subacute
infarction. Mild small vessel type changes of the hemispheric white
matter. No sign of metastatic lesion. No hemorrhage, hydrocephalus
or extra-axial collection. No other cause of abnormal enhancement is
identified.

Vascular: Major vessels at the base of the brain show flow.

Skull and upper cervical spine: Negative

Sinuses/Orbits: Clear/normal

Other: None
IMPRESSION: No change. Generalized atrophy. Mild chronic small-vessel change of
the hemispheric white matter. No sign of metastatic disease.

## 2021-01-27 ENCOUNTER — Other Ambulatory Visit: Payer: Self-pay | Admitting: Cardiology

## 2021-02-01 ENCOUNTER — Telehealth: Payer: Self-pay | Admitting: Cardiology

## 2021-02-01 NOTE — Telephone Encounter (Signed)
Pt calling to clarify her dosing. The pharmacy was not sure of how she was supposed to take it. I confirmed with her she is to take every M, W, and F. She had no additional needs.

## 2021-02-01 NOTE — Telephone Encounter (Signed)
   Pt c/o medication issue:  1. Name of Medication: pravastatin (PRAVACHOL) 80 MG tablet  2. How are you currently taking this medication (dosage and times per day)? Take every Monday - Wednesday-Friday  3. Are you having a reaction (difficulty breathing--STAT)?   4. What is your medication issue? Pt would like to clarify dosage of this meds, she said she is taking it every night because Dr. [name] change it, but pharmacy said dosage is still to take meds Monday-wedenesday-Friday. She said it needs to be corrected

## 2021-02-02 ENCOUNTER — Ambulatory Visit (HOSPITAL_COMMUNITY): Payer: Medicare HMO | Attending: Cardiology

## 2021-02-02 ENCOUNTER — Other Ambulatory Visit: Payer: Self-pay

## 2021-02-02 DIAGNOSIS — R6889 Other general symptoms and signs: Secondary | ICD-10-CM | POA: Diagnosis not present

## 2021-02-02 DIAGNOSIS — I5189 Other ill-defined heart diseases: Secondary | ICD-10-CM | POA: Diagnosis not present

## 2021-02-02 HISTORY — PX: TRANSTHORACIC ECHOCARDIOGRAM: SHX275

## 2021-02-02 LAB — ECHOCARDIOGRAM COMPLETE
Area-P 1/2: 3.99 cm2
S' Lateral: 2.9 cm

## 2021-02-07 ENCOUNTER — Telehealth: Payer: Self-pay | Admitting: Cardiology

## 2021-02-07 NOTE — Telephone Encounter (Signed)
Called patient, advised of ECHO results. Patient verbalized understanding.   

## 2021-02-07 NOTE — Progress Notes (Signed)
Left message requesting return call. TDS

## 2021-02-07 NOTE — Telephone Encounter (Signed)
Pt is returning call for Echo Results

## 2021-02-13 ENCOUNTER — Other Ambulatory Visit: Payer: Self-pay

## 2021-02-13 ENCOUNTER — Encounter: Payer: Self-pay | Admitting: Physician Assistant

## 2021-02-13 ENCOUNTER — Ambulatory Visit (INDEPENDENT_AMBULATORY_CARE_PROVIDER_SITE_OTHER): Payer: Medicare HMO | Admitting: Physician Assistant

## 2021-02-13 VITALS — BP 112/68 | HR 83 | Resp 20 | Ht 65.0 in | Wt 133.0 lb

## 2021-02-13 DIAGNOSIS — I5189 Other ill-defined heart diseases: Secondary | ICD-10-CM

## 2021-02-13 DIAGNOSIS — I10 Essential (primary) hypertension: Secondary | ICD-10-CM

## 2021-02-13 DIAGNOSIS — Z9181 History of falling: Secondary | ICD-10-CM | POA: Diagnosis not present

## 2021-02-13 DIAGNOSIS — R0600 Dyspnea, unspecified: Secondary | ICD-10-CM

## 2021-02-13 DIAGNOSIS — E785 Hyperlipidemia, unspecified: Secondary | ICD-10-CM | POA: Diagnosis not present

## 2021-02-13 DIAGNOSIS — I2510 Atherosclerotic heart disease of native coronary artery without angina pectoris: Secondary | ICD-10-CM

## 2021-02-13 DIAGNOSIS — R0609 Other forms of dyspnea: Secondary | ICD-10-CM

## 2021-02-13 DIAGNOSIS — W19XXXA Unspecified fall, initial encounter: Secondary | ICD-10-CM

## 2021-02-13 MED ORDER — FUROSEMIDE 20 MG PO TABS: 20.0000 mg | ORAL_TABLET | ORAL | 3 refills | Status: DC | PRN
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
## 2021-02-13 NOTE — Patient Instructions (Signed)
Medication Instructions:  TAKE Lasix as needed  TAKE over the counter Colace when needed  *If you need a refill on your cardiac medications before your next appointment, please call your pharmacy*  Lab Work: NONE ordered at this time of appointment    If you have labs (blood work) drawn today and your tests are completely normal, you will receive your results only by: [place] (if you have MyChart) OR A paper copy in the mail If you have any lab test that is abnormal or we need to change your treatment, we will call you to review the results.  Testing/Procedures: NONE ordered at this time of appointment   Follow-Up: At [facility], you and your health needs are our priority.  As part of our continuing mission to provide you with exceptional heart care, we have created designated Provider Care Teams.  These Care Teams include your primary Cardiologist (physician) and Advanced Practice Providers (APPs -  Physician Assistants and Nurse Practitioners) who all work together to provide you with the care you need, when you need it.  Your next appointment:   4-5 month(s)  The format for your next appointment:   In Person  Provider:   Glenetta Hew, MD  Other Instructions

## 2021-02-14 ENCOUNTER — Telehealth: Payer: Self-pay | Admitting: Cardiology

## 2021-02-14 NOTE — Telephone Encounter (Signed)
Returned call to patient left message on personal voice mail to call back. 

## 2021-02-14 NOTE — Telephone Encounter (Signed)
Returned call to patient left message on personal voice mail I will send message to [place] for assistance for walker code #.

## 2021-02-14 NOTE — Telephone Encounter (Signed)
Patient states Michele [name], [place] ordered a walker with a seat for her. She states her daughter contacted the company that distributes the walkers to confirm they received it. It has been received, but per patient they are missing a code that is needed to process the orders. Please assist.

## 2021-02-15 ENCOUNTER — Encounter: Payer: Self-pay | Admitting: Physician Assistant

## 2021-02-15 NOTE — Telephone Encounter (Signed)
Thomas Dengate (Roommate/boyfriend) walked in to get the "code" that is needed for her walker. Informed him that we need to know what "code" they need and we will try to help with this "code"

## 2021-02-16 NOTE — Telephone Encounter (Signed)
Patients spouse Music therapist) was contacted, and informed him that order for patient was at front desk of [facility] office and ready to be picked up when they were able to stop by the office.

## 2021-02-16 NOTE — Telephone Encounter (Signed)
[employer] and spoke with Sasha. I informed her that the EMR system that we use does not add the ICD code when DME order is printed it will have the name associated ICD code and not the code. She stated that she spoke with her supervisor and stated that it was ok for the ordering provider to write in the code inial.   Gave the order to [facility] at the front check in and ask if she would call the patient to pick up the form.

## 2021-02-16 NOTE — Telephone Encounter (Signed)
Thomas ( patient's S/O) was calling to follow up on getting the updated rx for the patient's walker

## 2021-02-20 DIAGNOSIS — R0600 Dyspnea, unspecified: Secondary | ICD-10-CM | POA: Diagnosis not present

## 2021-02-21 ENCOUNTER — Telehealth: Payer: Self-pay | Admitting: Cardiology

## 2021-02-21 NOTE — Telephone Encounter (Signed)
Pt c/o medication issue:  1. Name of Medication: Furosemide   2. How are you currently taking this medication (dosage and times per day)? Once a day  3. Are you having a reaction (difficulty breathing--STAT)? No   4. What is your medication issue? BP patient stop taking the medication , because her BP starting coming down on Tuesday this week it started going back up    Pt c/o BP issue: STAT if pt c/o blurred vision, one-sided weakness or slurred speech  1. What are your last 5 BP readings? On Tuesday 02/20/21 147/76 Pulse 88 152/77 Pulse 87 at 2:30 pm 144/67 pulse 89 in the same arm  2. Are you having any other symptoms (ex. Dizziness, headache, blurred vision, passed out)? All  3. What is your BP issue? Patient is not sure      Pt c/o swelling: STAT is pt has developed SOB within 24 hours  If swelling, where is the swelling located? Left foot  How much weight have you gained and in what time span? 4 pounds since the 14 th   Have you gained 3 pounds in a day or 5 pounds in a week? Patient   Do you have a log of your daily weights (if so, list)? 4 in 7 days  Are you currently taking a fluid pill? Yes   Are you currently SOB? Normal SOB  Have you traveled recently? No

## 2021-02-21 NOTE — Telephone Encounter (Signed)
Pt called in and was worried about her swelling and her BP. She states that her weight is up 4# in a week. She will take Lasix either tonight or in the AM. She does state that she has dizziness and SOB "all the time", SOB is "how it always is" her BP has been running on 02/20/21 147/76 Pulse 88 152/77 Pulse 87 at 2:30 pm 144/67 pulse 89 in the same arm. Explained how/when to take her Lasix. She will continue meds as ordered and weigh daily, and monitor BP.

## 2021-02-23 NOTE — Telephone Encounter (Signed)
I spoke with patient, after using the Lasix for 2 to 3 days, her lower extremity edema and shortness of breath has improved.  I recommend stop the Lasix for now and use it only as needed if her weight increased by more than 3 pounds overnight or 5 pounds in single week.  She also has a lot of abdominal bloating.  Her last bowel movement was yesterday.  If abdominal bloating does not improve after stopping the Lasix, may use try some of the over-the-counter Gas-X.

## 2021-02-26 ENCOUNTER — Other Ambulatory Visit (HOSPITAL_BASED_OUTPATIENT_CLINIC_OR_DEPARTMENT_OTHER): Payer: Self-pay | Admitting: Family

## 2021-02-26 DIAGNOSIS — R0600 Dyspnea, unspecified: Secondary | ICD-10-CM

## 2021-02-26 DIAGNOSIS — I5189 Other ill-defined heart diseases: Secondary | ICD-10-CM

## 2021-02-26 DIAGNOSIS — R0609 Other forms of dyspnea: Secondary | ICD-10-CM

## 2021-02-26 NOTE — Telephone Encounter (Signed)
Rx(s) sent to pharmacy electronically.  

## 2021-03-01 ENCOUNTER — Telehealth: Payer: Self-pay | Admitting: Pulmonary Disease

## 2021-03-01 DIAGNOSIS — R079 Chest pain, unspecified: Secondary | ICD-10-CM

## 2021-03-01 DIAGNOSIS — J479 Bronchiectasis, uncomplicated: Secondary | ICD-10-CM

## 2021-03-01 NOTE — Telephone Encounter (Signed)
Pt states she has been hurting a lot in her left lung(2 or 3 months), spitting up a lot of thick phlegm.Pt wanting to see if [facility] will order an x ray so she can find out what's going on. Pt borrowing daughter's car, able to get to our office today. Please advise [phone]

## 2021-03-01 NOTE — Telephone Encounter (Signed)
Patient showed up in office instead of returning phone call. She has been scheduled with Beth tomm with CXR prior to visit. Nothing further needed.

## 2021-03-01 NOTE — Telephone Encounter (Signed)
ATC, left VM. 

## 2021-03-02 ENCOUNTER — Ambulatory Visit: Payer: Medicare HMO | Admitting: Primary Care
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
## 2021-03-06 ENCOUNTER — Other Ambulatory Visit: Payer: Self-pay | Admitting: *Deleted

## 2021-03-06 ENCOUNTER — Ambulatory Visit (INDEPENDENT_AMBULATORY_CARE_PROVIDER_SITE_OTHER): Payer: Medicare HMO | Admitting: Primary Care

## 2021-03-06 ENCOUNTER — Ambulatory Visit (INDEPENDENT_AMBULATORY_CARE_PROVIDER_SITE_OTHER): Payer: Medicare HMO

## 2021-03-06 ENCOUNTER — Telehealth: Payer: Self-pay | Admitting: Primary Care

## 2021-03-06 ENCOUNTER — Encounter: Payer: Self-pay | Admitting: Primary Care

## 2021-03-06 ENCOUNTER — Other Ambulatory Visit: Payer: Self-pay

## 2021-03-06 DIAGNOSIS — R079 Chest pain, unspecified: Secondary | ICD-10-CM | POA: Diagnosis not present

## 2021-03-06 DIAGNOSIS — J479 Bronchiectasis, uncomplicated: Secondary | ICD-10-CM | POA: Diagnosis not present

## 2021-03-06 DIAGNOSIS — J449 Chronic obstructive pulmonary disease, unspecified: Secondary | ICD-10-CM

## 2021-03-06 DIAGNOSIS — C3491 Malignant neoplasm of unspecified part of right bronchus or lung: Secondary | ICD-10-CM

## 2021-03-06 DIAGNOSIS — J439 Emphysema, unspecified: Secondary | ICD-10-CM

## 2021-03-06 DIAGNOSIS — R296 Repeated falls: Secondary | ICD-10-CM

## 2021-03-06 DIAGNOSIS — J471 Bronchiectasis with (acute) exacerbation: Secondary | ICD-10-CM | POA: Diagnosis not present

## 2021-03-06 DIAGNOSIS — W19XXXD Unspecified fall, subsequent encounter: Secondary | ICD-10-CM | POA: Diagnosis not present

## 2021-03-06 DIAGNOSIS — R413 Other amnesia: Secondary | ICD-10-CM

## 2021-03-06 MED ORDER — ARNUITY ELLIPTA 100 MCG/ACT IN AEPB: 1.0000 | INHALATION_SPRAY | Freq: Every day | RESPIRATORY_TRACT | 6 refills | Status: DC

## 2021-03-06 MED ORDER — DOXYCYCLINE HYCLATE 100 MG PO TABS: 100.0000 mg | ORAL_TABLET | Freq: Two times a day (BID) | ORAL | 0 refills | Status: DC

## 2021-03-06 MED ORDER — TIOTROPIUM BROMIDE MONOHYDRATE 18 MCG IN CAPS: 18.0000 ug | ORAL_CAPSULE | Freq: Every day | RESPIRATORY_TRACT | 6 refills | Status: DC

## 2021-03-06 MED ORDER — LEVALBUTEROL HCL 0.63 MG/3ML IN NEBU: 0.6300 mg | INHALATION_SOLUTION | RESPIRATORY_TRACT | 1 refills | Status: DC | PRN

## 2021-03-06 MED ORDER — SODIUM CHLORIDE 3 % IN NEBU: INHALATION_SOLUTION | RESPIRATORY_TRACT | 1 refills | Status: DC | PRN

## 2021-03-06 NOTE — Telephone Encounter (Signed)
Patient is returning phone call. Also would like results of chest xray. Patient phone number is [phone] c or [phone] c Michele [name].

## 2021-03-06 NOTE — Assessment & Plan Note (Signed)
-   Following with oncology, last seen in April 2022 - No evidence of disease reoccurrence

## 2021-03-06 NOTE — Assessment & Plan Note (Addendum)
-   Continue Arnuity and Spiriva 72mcg (intolerant to LABA) - Sending in new DME order for nebulizer machine and refilling levalbuterol  - Smoking cessation encouraged - Need influenza vaccine at follow-up

## 2021-03-06 NOTE — Addendum Note (Signed)
Addended by: Vanessa Barbara on: 03/06/2021 12:20 PM   Modules accepted: Orders

## 2021-03-06 NOTE — Assessment & Plan Note (Signed)
-   Increased cough with mucus production. Associated left side discomfort. CXR pending. Sending in Rx doxycycline 100mg  BID x 10 days. Advised she resume mucinex, flutter valve and adding hypertonic saline nebulizer

## 2021-03-06 NOTE — Addendum Note (Signed)
Addended by: Vanessa Barbara on: 03/06/2021 03:06 PM   Modules accepted: Orders

## 2021-03-06 NOTE — Progress Notes (Signed)
Reviewed and agree with assessment/plan.   [name], MD [facility] Pulmonary/Critical Care 03/06/2021, 11:25 AM Pager:  [phone]

## 2021-03-06 NOTE — Telephone Encounter (Signed)
I have called and LM on VM for the pt to call back about this message.  It looks like we did not do the referral.  Looks like the pt was going to schedule this on her own.

## 2021-03-06 NOTE — Progress Notes (Signed)
Please let patient know CXR looked ok. No evidence of pneumonia. Emphysema right upper lung.

## 2021-03-06 NOTE — Assessment & Plan Note (Addendum)
-   Patient reports falls at home with shuffle gait. No obvious injury on exam today. Recommend she use walker - She will be establishing with neurology

## 2021-03-06 NOTE — Patient Instructions (Addendum)
Nice seeing you today Ms Lannen  Recommendations: - Continue Arnuity and Spiriva - Start Mucinex-DM 600mg  twice a day for cough - Start hypertonic saline nebulizer twice a day as needed for cough  - Start using flutter valve three times a day   Rx: - Doxycycline 1 tablet twice a day x 10 days  - Hypertonic saline nebulizer   Follow-up: 2-3 months with Dr. Halford Chessman

## 2021-03-06 NOTE — Addendum Note (Signed)
Addended by: Vanessa Barbara on: 03/06/2021 12:01 PM   Modules accepted: Orders

## 2021-03-07 ENCOUNTER — Telehealth: Payer: Self-pay | Admitting: Medical Oncology

## 2021-03-07 ENCOUNTER — Encounter: Payer: Self-pay | Admitting: Neurology

## 2021-03-07 NOTE — Telephone Encounter (Signed)
Patient returned call, she was asking about a referral to neurology that she states was discussed during her [place] with Beth on 03/06/21, she also wanted to know if she could take musinex 1200 mg instead of 600 mg twice daily (her roommate picked up the 1200 mg instead of 600 mg), she also stated that this morning she had a very small amount of blood in her mucous when she coughed.  I let her know that I spoke with [facility] and that it was fine to take the Musinex 1200 mg twice daily and to make sure she takes it with plenty of water.  I asked her if she had seen neurology in the past as the man in the room during the [place] stated they were going to call neurology during the visit.  They saw neurology at the cancer center, but it has been a while.  Advised that Eustaquio Maize is fine with [place] placing a referral to neurology and that she will receive a call from the neurology office to schedule the office visit with them.  I provided the CXR result to her per Geraldo Pitter NP.  Advised that if she continues to see blood in her mucous when she coughs or if it increases in amount to call us back and let us know.  She verbalized understanding.  Neurology consult placed.  Nothing further needed.

## 2021-03-07 NOTE — Telephone Encounter (Signed)
Falls   "I fall a lot and hurt myself, I don't know what's going on ,but I want to make an appt to see Dr Mickeal Skinner".   12/23- she has appt with [facility] ( Dr D. Patel)   LVM to call back so I can get more specific information.  Message routed to Dr Mickeal Skinner.

## 2021-03-08 DIAGNOSIS — J441 Chronic obstructive pulmonary disease with (acute) exacerbation: Secondary | ICD-10-CM | POA: Diagnosis not present

## 2021-03-09 ENCOUNTER — Telehealth: Payer: Self-pay | Admitting: Internal Medicine

## 2021-03-09 NOTE — Telephone Encounter (Signed)
Scheduled per sch msg. Called and spoke with patient. Confirmed appt  

## 2021-03-15 ENCOUNTER — Inpatient Hospital Stay: Payer: Medicare HMO | Attending: Internal Medicine | Admitting: Internal Medicine

## 2021-03-15 ENCOUNTER — Other Ambulatory Visit: Payer: Self-pay

## 2021-03-15 VITALS — BP 112/63 | HR 87 | Temp 97.8°F | Resp 18 | Wt 139.2 lb

## 2021-03-15 DIAGNOSIS — G629 Polyneuropathy, unspecified: Secondary | ICD-10-CM

## 2021-03-15 DIAGNOSIS — F17210 Nicotine dependence, cigarettes, uncomplicated: Secondary | ICD-10-CM | POA: Insufficient documentation

## 2021-03-15 DIAGNOSIS — C3490 Malignant neoplasm of unspecified part of unspecified bronchus or lung: Secondary | ICD-10-CM | POA: Insufficient documentation

## 2021-03-15 DIAGNOSIS — R270 Ataxia, unspecified: Secondary | ICD-10-CM

## 2021-03-15 DIAGNOSIS — C3491 Malignant neoplasm of unspecified part of right bronchus or lung: Secondary | ICD-10-CM | POA: Diagnosis not present

## 2021-03-15 DIAGNOSIS — R519 Headache, unspecified: Secondary | ICD-10-CM | POA: Diagnosis not present
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
## 2021-03-22 DIAGNOSIS — F419 Anxiety disorder, unspecified: Secondary | ICD-10-CM | POA: Diagnosis not present

## 2021-03-22 DIAGNOSIS — K219 Gastro-esophageal reflux disease without esophagitis: Secondary | ICD-10-CM | POA: Diagnosis not present

## 2021-03-22 DIAGNOSIS — R7303 Prediabetes: Secondary | ICD-10-CM | POA: Diagnosis not present

## 2021-03-22 DIAGNOSIS — Z0000 Encounter for general adult medical examination without abnormal findings: Secondary | ICD-10-CM | POA: Diagnosis not present

## 2021-03-22 DIAGNOSIS — C3490 Malignant neoplasm of unspecified part of unspecified bronchus or lung: Secondary | ICD-10-CM | POA: Diagnosis not present

## 2021-03-22 DIAGNOSIS — E7800 Pure hypercholesterolemia, unspecified: Secondary | ICD-10-CM | POA: Diagnosis not present

## 2021-03-22 DIAGNOSIS — J449 Chronic obstructive pulmonary disease, unspecified: Secondary | ICD-10-CM | POA: Diagnosis not present

## 2021-03-22 DIAGNOSIS — Z23 Encounter for immunization: Secondary | ICD-10-CM | POA: Diagnosis not present

## 2021-03-22 DIAGNOSIS — F17200 Nicotine dependence, unspecified, uncomplicated: Secondary | ICD-10-CM | POA: Diagnosis not present

## 2021-03-27 ENCOUNTER — Other Ambulatory Visit: Payer: Self-pay | Admitting: Radiation Therapy

## 2021-04-06 ENCOUNTER — Other Ambulatory Visit: Payer: Self-pay

## 2021-04-06 ENCOUNTER — Ambulatory Visit (HOSPITAL_COMMUNITY)
Admission: RE | Admit: 2021-04-06 | Discharge: 2021-04-06 | Disposition: A | Discharge status: Home / Self Care | Payer: Medicare HMO | Source: Ambulatory Visit | Attending: Internal Medicine | Admitting: Internal Medicine

## 2021-04-06 DIAGNOSIS — R270 Ataxia, unspecified: Secondary | ICD-10-CM | POA: Diagnosis not present

## 2021-04-06 DIAGNOSIS — C3491 Malignant neoplasm of unspecified part of right bronchus or lung: Secondary | ICD-10-CM | POA: Insufficient documentation

## 2021-04-06 DIAGNOSIS — C3490 Malignant neoplasm of unspecified part of unspecified bronchus or lung: Secondary | ICD-10-CM | POA: Diagnosis not present

## 2021-04-06 DIAGNOSIS — C719 Malignant neoplasm of brain, unspecified: Secondary | ICD-10-CM | POA: Diagnosis not present

## 2021-04-06 MED ORDER — GADOBUTROL 1 MMOL/ML IV SOLN
6.0000 mL | Freq: Once | INTRAVENOUS | Status: AC | PRN
  Administered 2021-04-06: 6 mL via INTRAVENOUS

## 2021-04-08 DIAGNOSIS — J441 Chronic obstructive pulmonary disease with (acute) exacerbation: Secondary | ICD-10-CM | POA: Diagnosis not present

## 2021-04-12 ENCOUNTER — Inpatient Hospital Stay: Payer: Medicare HMO | Attending: Internal Medicine | Admitting: Internal Medicine

## 2021-04-12 DIAGNOSIS — G44219 Episodic tension-type headache, not intractable: Secondary | ICD-10-CM | POA: Diagnosis not present

## 2021-04-12 DIAGNOSIS — G629 Polyneuropathy, unspecified: Secondary | ICD-10-CM | POA: Diagnosis not present

## 2021-04-12 NOTE — Progress Notes (Signed)
I connected with Michele [name] on 04/12/21 at 12:30 PM EST by telephone visit and verified that I am speaking with the correct person using two identifiers.  I discussed the limitations, risks, security and privacy concerns of performing an evaluation and management service by telemedicine and the availability of in-person appointments. I also discussed with the patient that there may be a patient responsible charge related to this service. The patient expressed understanding and agreed to proceed.  Other persons participating in the visit and their role in the encounter:  n/a  Patient's location:  Home  Provider's location:  Office  Chief Complaint:  Neuropathy  Headache, frequent episodic tension-type  History of Present Ilness: Michele [name] does not describe any change in balance, headache symptoms.  She has cut down somewhat on the xanax, now only taking 1/2 a pill twice per day as opposed to a full tablet.  No issues with her brain MRI study.  Observations: Language and cognition at baseline  Imaging:  [facility] Clinician Interpretation: I have personally reviewed the CNS images as listed.  My interpretation, in the context of the patient's clinical presentation, is stable disease  MR BRAIN W WO CONTRAST  Result Date: 04/06/2021 CLINICAL DATA:  Brain/CNS neoplasm, assess treatment response; lung cancer with prior history of prophylactic whole brain radiation EXAM: MRI HEAD WITHOUT AND WITH CONTRAST TECHNIQUE: Multiplanar, multiecho pulse sequences of the brain and surrounding structures were obtained without and with intravenous contrast. CONTRAST:  38mL GADAVIST GADOBUTROL 1 MMOL/ML IV SOLN COMPARISON:  November 2021 FINDINGS: Brain: There is no acute infarction or intracranial hemorrhage. There is no intracranial mass, mass effect, or edema. There is no hydrocephalus or extra-axial fluid collection. Ventricles and sulci are stable in size and configuration. Patchy T2 hyperintensity in the  supratentorial white matter is nonspecific and may reflect chronic microvascular ischemic and/or treatment related changes. A punctate focus of left parietal susceptibility is again identified and likely reflects chronic microhemorrhage. No abnormal enhancement. Vascular: Major vessel flow voids at the skull base are preserved. Skull and upper cervical spine: Normal marrow signal is preserved. Sinuses/Orbits: Paranasal sinuses are aerated. Orbits are unremarkable. Other: Sella is partially empty.  Mastoid air cells are clear. IMPRESSION: No evidence of intracranial metastatic disease. Electronically Signed   By: Macy Mis M.D.   On: 04/06/2021 15:03    Assessment and Plan: Neuropathy  Headache, frequent episodic tension-type  Michele [name] is clinically and radiographically stable today.  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  Follow Up Instructions: Repeat MRI brain and follow up in 24 months, or sooner if needed  I discussed the assessment and treatment plan with the patient.  The patient was provided an opportunity to ask questions and all were answered.  The patient agreed with the plan and demonstrated understanding of the instructions.    The patient was advised to call back or seek an in-person evaluation if the symptoms worsen or if the condition fails to improve as anticipated.  I provided 5-10 minutes of non-face-to-face time during this enocunter.  Ventura Sellers, MD   I provided 22 minutes of non face-to-face telephone visit time during this encounter, and > 50% was spent counseling as documented under my assessment & plan.

## 2021-04-22 ENCOUNTER — Other Ambulatory Visit: Payer: Self-pay | Admitting: Cardiology

## 2021-04-23 ENCOUNTER — Telehealth: Payer: Self-pay | Admitting: Cardiology

## 2021-04-23 MED ORDER — PRAVASTATIN SODIUM 80 MG PO TABS: ORAL_TABLET | ORAL | 3 refills | Status: DC

## 2021-04-23 NOTE — Telephone Encounter (Signed)
Attempted refill X2 and request sent to pharmacy keeps printing  Need to try on 04/24/21

## 2021-04-23 NOTE — Telephone Encounter (Signed)
*  STAT* If patient is at the pharmacy, call can be transferred to refill team.   1. Which medications need to be refilled? (please list name of each medication and dose if known)  pravastatin (PRAVACHOL) 80 MG tablet  2. Which pharmacy/location (including street and city if local pharmacy) is medication to be sent to? [facility] [place], [place]  3. Do they need a 30 day or 90 day supply?   90 day supply  Patient states the pharmacy did not receive the Rx for the medication. She states she takes it on Mon/Wed/Fri and this needs to be on the instructions.

## 2021-04-24 MED ORDER — PRAVASTATIN SODIUM 80 MG PO TABS: ORAL_TABLET | ORAL | 3 refills | Status: DC

## 2021-04-25 MED ORDER — PRAVASTATIN SODIUM 80 MG PO TABS: ORAL_TABLET | ORAL | 3 refills | Status: DC

## 2021-04-25 NOTE — Telephone Encounter (Signed)
Rx refill attempted on 11/22 - printed and did not go thru  Phoned in refill request

## 2021-04-29 IMAGING — CR DG HIP (WITH OR WITHOUT PELVIS) 2-3V LEFT
3 series · 3 of 3 positions shown · non-contrast
Comparison: None

CLINICAL DATA: Fell this morning at doctor's office injuring back
and LEFT hip, worse pain at LEFT groin, chronic low back pain

EXAM:
DG HIP (WITH OR WITHOUT PELVIS) 2-3V LEFT

[t pelvis ap]
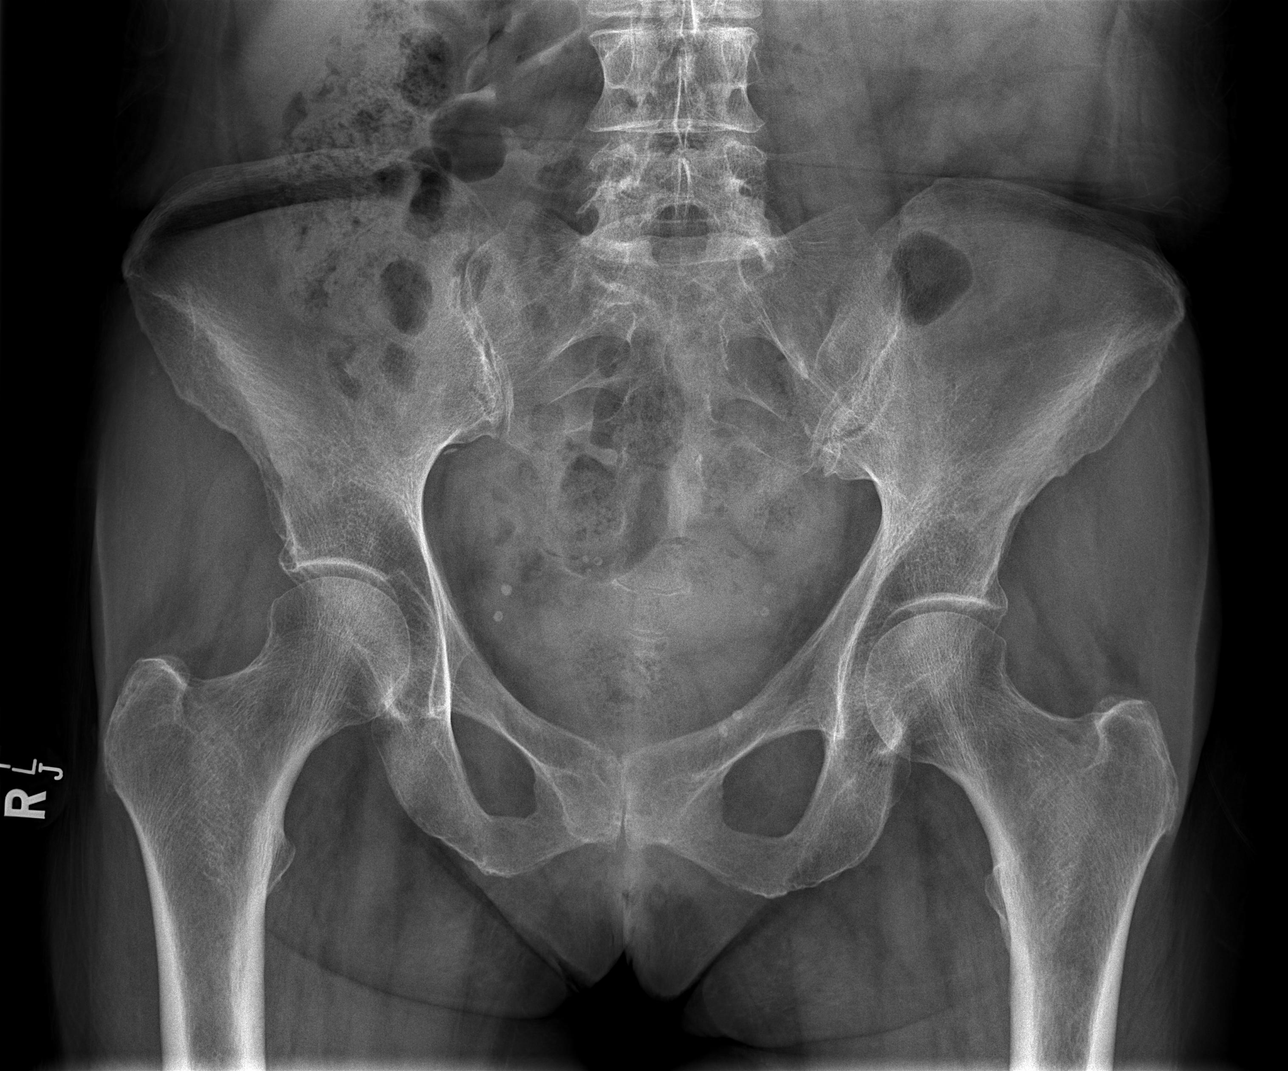

[t hip ap left]
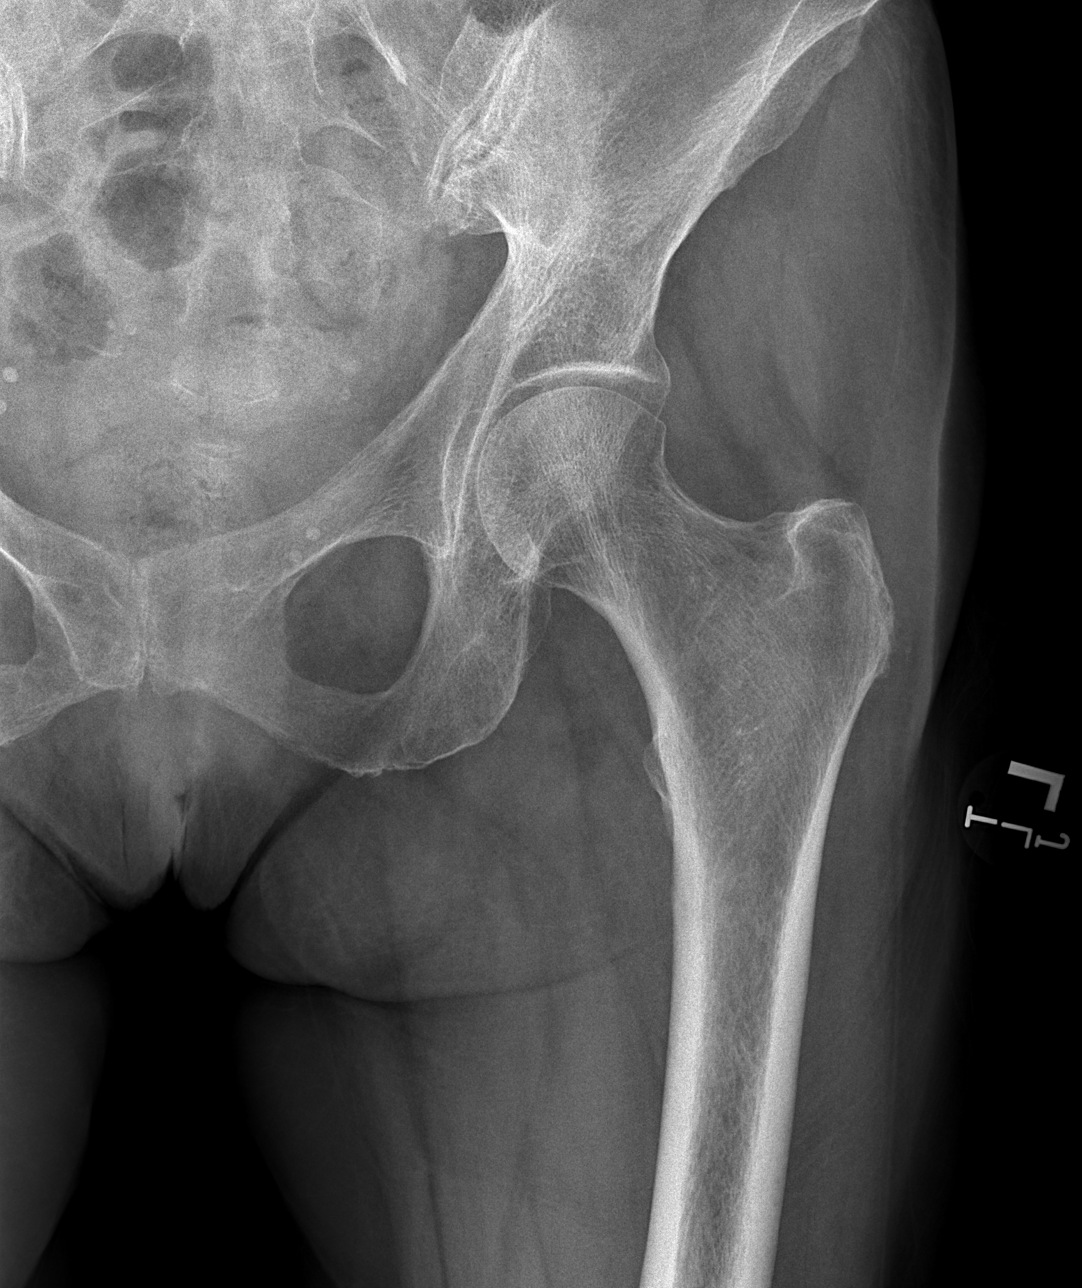

[t hip frog left]
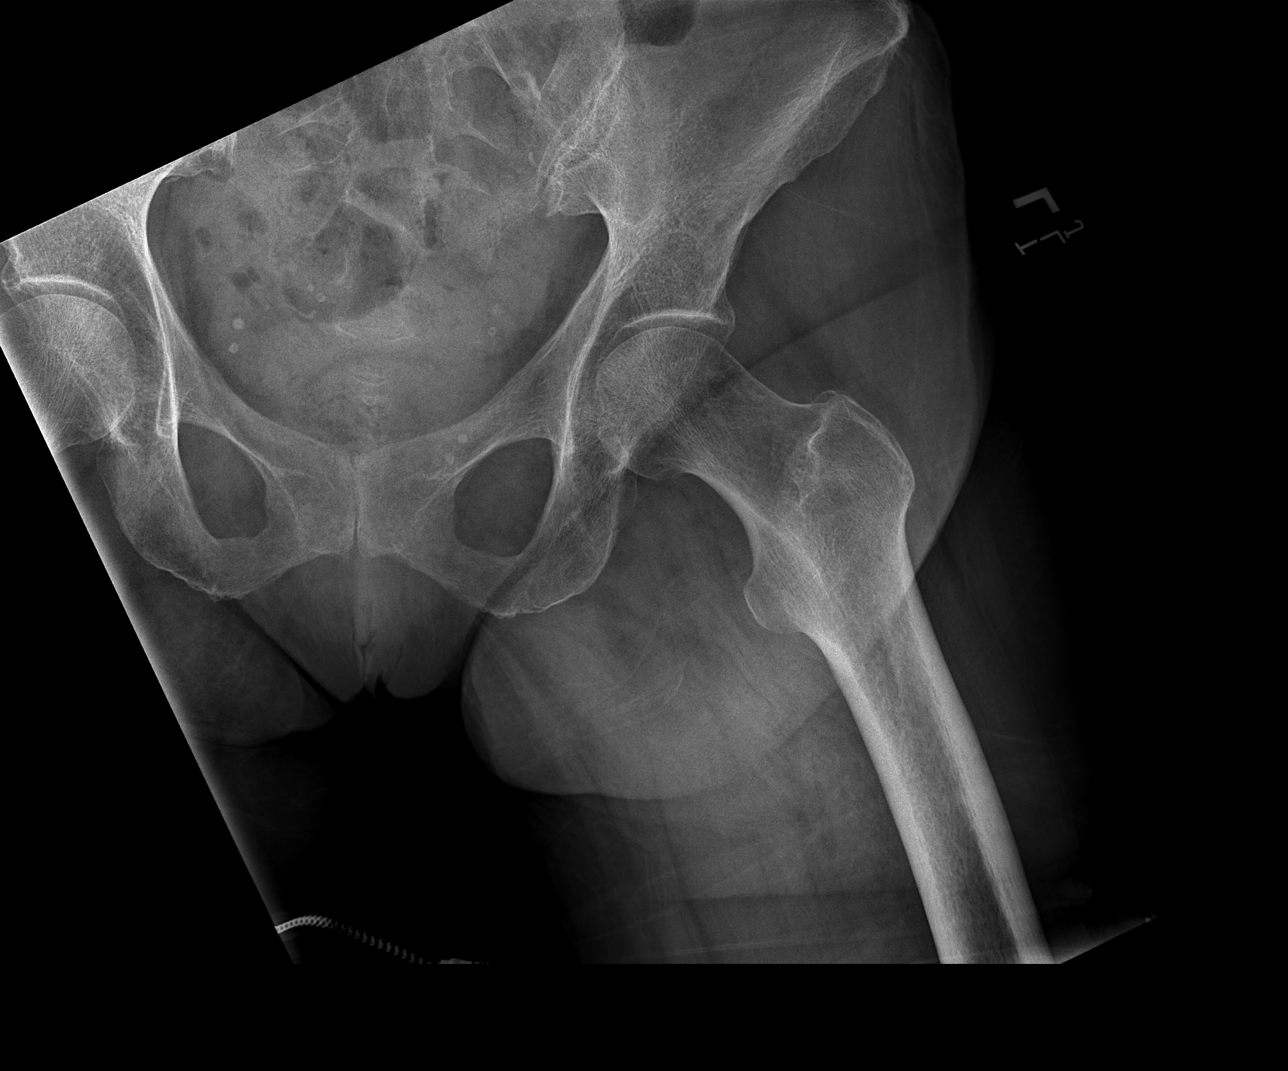

[3 of 3 positions shown; findings below may reference images not displayed]

FINDINGS: Osseous demineralization.

Hip and SI joint spaces preserved.

No acute fracture, dislocation, or bone destruction.
IMPRESSION: No acute osseous abnormalities.

## 2021-05-03 DIAGNOSIS — L720 Epidermal cyst: Secondary | ICD-10-CM | POA: Diagnosis not present

## 2021-05-03 DIAGNOSIS — C44519 Basal cell carcinoma of skin of other part of trunk: Secondary | ICD-10-CM | POA: Diagnosis not present

## 2021-05-03 DIAGNOSIS — L814 Other melanin hyperpigmentation: Secondary | ICD-10-CM | POA: Diagnosis not present

## 2021-05-03 DIAGNOSIS — L281 Prurigo nodularis: Secondary | ICD-10-CM | POA: Diagnosis not present

## 2021-05-03 DIAGNOSIS — Z85828 Personal history of other malignant neoplasm of skin: Secondary | ICD-10-CM | POA: Diagnosis not present

## 2021-05-03 DIAGNOSIS — D692 Other nonthrombocytopenic purpura: Secondary | ICD-10-CM | POA: Diagnosis not present

## 2021-05-03 DIAGNOSIS — L821 Other seborrheic keratosis: Secondary | ICD-10-CM | POA: Diagnosis not present

## 2021-05-03 DIAGNOSIS — L570 Actinic keratosis: Secondary | ICD-10-CM | POA: Diagnosis not present

## 2021-05-03 DIAGNOSIS — D2371 Other benign neoplasm of skin of right lower limb, including hip: Secondary | ICD-10-CM | POA: Diagnosis not present

## 2021-05-08 ENCOUNTER — Encounter: Payer: Self-pay | Admitting: Primary Care

## 2021-05-08 ENCOUNTER — Ambulatory Visit (INDEPENDENT_AMBULATORY_CARE_PROVIDER_SITE_OTHER): Payer: Medicare HMO | Admitting: Primary Care

## 2021-05-08 ENCOUNTER — Other Ambulatory Visit: Payer: Self-pay

## 2021-05-08 VITALS — BP 122/72 | HR 95 | Temp 97.9°F | Ht 65.0 in | Wt 135.8 lb

## 2021-05-08 DIAGNOSIS — J439 Emphysema, unspecified: Secondary | ICD-10-CM

## 2021-05-08 DIAGNOSIS — C3491 Malignant neoplasm of unspecified part of right bronchus or lung: Secondary | ICD-10-CM

## 2021-05-08 DIAGNOSIS — J479 Bronchiectasis, uncomplicated: Secondary | ICD-10-CM | POA: Diagnosis not present

## 2021-05-08 DIAGNOSIS — J449 Chronic obstructive pulmonary disease, unspecified: Secondary | ICD-10-CM | POA: Diagnosis not present

## 2021-05-08 MED ORDER — LEVALBUTEROL TARTRATE 45 MCG/ACT IN AERO: INHALATION_SPRAY | RESPIRATORY_TRACT | 2 refills | Status: DC

## 2021-05-08 NOTE — Assessment & Plan Note (Addendum)
-   Stable; No recent hospitalizations. Exam was benign today  - Maintained on Arnuity and Spiriva HandiHaler (intolerant to LABA) - Strongly encourage patient quit smoking

## 2021-05-08 NOTE — Progress Notes (Signed)
Reviewed and agree with assessment/plan.   [name], MD [facility] 05/08/2021, 1:37 PM Pager:  [phone]
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
## 2021-05-08 NOTE — Assessment & Plan Note (Signed)
-   Following with oncology. CT chest in April 2022 with oncology that showed no evidence of disease reoccurrence. Due for repeat CT imaging in April 2023. No weight loss or recent hemoptysis.

## 2021-05-08 NOTE — Assessment & Plan Note (Addendum)
-   No signs of acute exacerbation today. She has a chronic productive cough with thick clear mucus. She is not consistently taking mucinex or using flutter valve. Checking sputum sample.

## 2021-05-08 NOTE — Patient Instructions (Addendum)
Recommendations: - Continue Arnuity and Spiriva as prescribed  - Continue Flonase nasal spray daily  - Continue Mucinex-DM 600-1200mg  twice a day for chest congestion/cough - Use using flutter valve three times a day  - STRONGLY encourage you quit smoking, taper amount and pick quit date. Use nicotine replacement therapy like gum or patches to help   Orders: - Respiratory sputum sample   Follow-up: - 3-4 months with Dr. Halford Chessman or sooner if needed

## 2021-05-11 LAB — RESPIRATORY CULTURE OR RESPIRATORY AND SPUTUM CULTURE
MICRO NUMBER:: 12720140
RESULT:: NORMAL

## 2021-05-15 NOTE — Progress Notes (Signed)
Please let patient know sputum culture was normal, no abnormal growth

## 2021-05-24 DIAGNOSIS — N3020 Other chronic cystitis without hematuria: Secondary | ICD-10-CM | POA: Diagnosis not present

## 2021-05-24 DIAGNOSIS — R300 Dysuria: Secondary | ICD-10-CM | POA: Diagnosis not present

## 2021-05-24 DIAGNOSIS — N393 Stress incontinence (female) (male): Secondary | ICD-10-CM | POA: Diagnosis not present

## 2021-05-25 ENCOUNTER — Ambulatory Visit: Payer: Medicare HMO | Admitting: Neurology

## 2021-05-25 NOTE — Progress Notes (Signed)
Her exam was stable when I saw her. She will always have some degree of cough with bronchiectasis. No evidence of infection. She needs to consistently take mucinex and use flutter valve.

## 2021-05-30 ENCOUNTER — Other Ambulatory Visit: Payer: Self-pay | Admitting: Cardiology

## 2021-05-30 DIAGNOSIS — I2119 ST elevation (STEMI) myocardial infarction involving other coronary artery of inferior wall: Secondary | ICD-10-CM

## 2021-05-30 DIAGNOSIS — E785 Hyperlipidemia, unspecified: Secondary | ICD-10-CM

## 2021-05-31 ENCOUNTER — Other Ambulatory Visit: Payer: Self-pay | Admitting: Cardiology

## 2021-05-31 DIAGNOSIS — I2119 ST elevation (STEMI) myocardial infarction involving other coronary artery of inferior wall: Secondary | ICD-10-CM

## 2021-05-31 DIAGNOSIS — E785 Hyperlipidemia, unspecified: Secondary | ICD-10-CM

## 2021-05-31 NOTE — Telephone Encounter (Signed)
°*  STAT* If patient is at the pharmacy, call can be transferred to refill team.   1. Which medications need to be refilled? (please list name of each medication and dose if known) Evolocumab (REPATHA SURECLICK) 517 MG/ML SOAJ  2. Which pharmacy/location (including street and city if local pharmacy) is medication to be sent to? [facility] [place], [place]  3. Do they need a 30 day or 90 day supply? 90  Patient states that our office deny it to the pharmacy

## 2021-06-01 ENCOUNTER — Other Ambulatory Visit: Payer: Self-pay

## 2021-06-01 ENCOUNTER — Other Ambulatory Visit: Payer: Self-pay | Admitting: Cardiology

## 2021-06-01 DIAGNOSIS — E785 Hyperlipidemia, unspecified: Secondary | ICD-10-CM

## 2021-06-01 DIAGNOSIS — I2119 ST elevation (STEMI) myocardial infarction involving other coronary artery of inferior wall: Secondary | ICD-10-CM

## 2021-06-01 MED ORDER — REPATHA SURECLICK 140 MG/ML ~~LOC~~ SOAJ: 140.0000 mg | SUBCUTANEOUS | 3 refills | Status: DC

## 2021-06-01 MED ORDER — REPATHA SURECLICK 140 MG/ML ~~LOC~~ SOAJ: SUBCUTANEOUS | 3 refills | Status: DC

## 2021-06-01 MED ORDER — REPATHA SURECLICK 140 MG/ML ~~LOC~~ SOAJ
140.0000 mg | SUBCUTANEOUS | 3 refills | Status: DC
Start: 2021-06-01 — End: 2021-06-01

## 2021-06-01 MED ORDER — REPATHA SURECLICK 140 MG/ML ~~LOC~~ SOAJ
140.0000 mg | SUBCUTANEOUS | 3 refills | Status: DC
Start: 2021-06-01 — End: 2022-04-29

## 2021-06-01 NOTE — Telephone Encounter (Signed)
repatha

## 2021-06-18 ENCOUNTER — Telehealth: Payer: Self-pay | Admitting: Cardiology

## 2021-06-18 NOTE — Telephone Encounter (Signed)
°  Patient c/o Palpitations:  High priority if patient c/o lightheadedness, shortness of breath, or chest pain  How long have you had palpitations/irregular HR/ Afib? Are you having the symptoms now? No   Are you currently experiencing lightheadedness, SOB or CP? SOB and all kinds of breathing issue  Do you have a history of afib (atrial fibrillation) or irregular heart rhythm? No   Have you checked your BP or HR? (document readings if available): none   Are you experiencing any other symptoms?   Pt said when she lay down on her left side, her heart beats faster. She feels palpitation often now, she said she take something for it and it usually helps her palpitations but now its not doing anything

## 2021-06-18 NOTE — Telephone Encounter (Signed)
Spoke with pt, she does report eating more chocolate than usual and she was made aware that some caffeine free drinks still have some caffeine. She was encouraged to decrease her caffeine intake to help with the palpations. She was also made aware that lying on her side the heart falls closer to the chest and she can feel the heart beats more. She has a follow up in February and will keep that appointment.

## 2021-06-22 ENCOUNTER — Other Ambulatory Visit: Payer: Self-pay | Admitting: Family Medicine

## 2021-06-22 ENCOUNTER — Telehealth: Payer: Self-pay | Admitting: Primary Care

## 2021-06-22 DIAGNOSIS — Z1231 Encounter for screening mammogram for malignant neoplasm of breast: Secondary | ICD-10-CM

## 2021-06-22 NOTE — Telephone Encounter (Signed)
Called and left voicemail for patient to call office back  

## 2021-06-22 NOTE — Telephone Encounter (Signed)
Called and spoke with patient.. She verbalized understanding. I did a complete chart search for the wording "emphysema 1" in her chart. I did not see it mentioned everywhere except for when she spoke with April earlier today. I asked her if she perhaps had another healthcare system in [place], she stated no.   I explained to her the different stages of emphysema.   Nothing further needed at time of call.

## 2021-06-22 NOTE — Telephone Encounter (Signed)
Called patient and she states on her AVS from last visit it stated that she was in Stage 1 Emphysema, and she is just wanting more clarification on that. Can you explain what this means so I can tell patient.  Please Advise Beth!!!

## 2021-06-22 NOTE — Telephone Encounter (Signed)
I did not write a stage on her AVS that I can see. She was noted to have emphysema right upper lobe on CXR. We will be better able to evaluate on low dose CT which is due in April 2023. Otherwise to tell the degree of emphysema she has she should have updated PFTs, we can discuss this at her follow-up

## 2021-06-25 ENCOUNTER — Telehealth: Payer: Self-pay | Admitting: Primary Care

## 2021-06-25 DIAGNOSIS — R059 Cough, unspecified: Secondary | ICD-10-CM

## 2021-06-25 NOTE — Telephone Encounter (Signed)
Called and spoke with patient. She stated that she has been coughing up some amounts of bright red blood over the past few days. She had called on Friday but there is not documentation of this except for a call about information on her AVS.   She also stated that she has been wheezing more as well as having more SOB episodes. Denied any chills. She has been taking Mucinex and Mucinex DM for the cough and they have not helped. She is still using her Arnuity inhaler once daily.   I attempted to her scheduled for an OV but she stated that she does not have transportation and the service she uses would not be able to get her scheduled until Thursday.   She wanted to know if Dr. Halford Chessman would be willing to send in something for the cough.   Pharmacy is [facility] on [address], can you please advise? Thanks!

## 2021-06-25 NOTE — Telephone Encounter (Signed)
Called and spoke with pt letting her know recs per Dr. Halford Chessman and pt verbalized understanding.  Have scheduled pt for an appt with BW Fri. 1/27 as pt stated that she had an appt scheduled Thurs. With Dr. Tammi Klippel and due to that, did not think she could make it to our office for an appt since she uses transportation services.  When stated to her that Dr. Halford Chessman was wanting [place] to send in Rx for augmentin and pred taper, pt said that she does not tolerate prednisone due to it causing her to have palpitations (listed on allergy list) and pt said that she also did not really want to have an abx sent in either and would just wait until appt. Nothing further needed.

## 2021-06-25 NOTE — Telephone Encounter (Signed)
Scheduled her for office visit on Thursday with a chest xray when she can arrange for transportation.  Please send script for augmentin 875 one bid for 7 days.  Please send script for prednisone 10 mg pill >> 3 pills daily for 2 days, 2 pills daily for 2 days, 1 pill daily for 2 days.

## 2021-06-28 DIAGNOSIS — N393 Stress incontinence (female) (male): Secondary | ICD-10-CM | POA: Diagnosis not present

## 2021-06-28 DIAGNOSIS — R350 Frequency of micturition: Secondary | ICD-10-CM | POA: Diagnosis not present

## 2021-06-28 DIAGNOSIS — R6889 Other general symptoms and signs: Secondary | ICD-10-CM | POA: Diagnosis not present

## 2021-06-28 DIAGNOSIS — N3281 Overactive bladder: Secondary | ICD-10-CM | POA: Diagnosis not present

## 2021-06-29 ENCOUNTER — Ambulatory Visit (INDEPENDENT_AMBULATORY_CARE_PROVIDER_SITE_OTHER): Payer: Medicare HMO | Admitting: Primary Care

## 2021-06-29 ENCOUNTER — Ambulatory Visit (INDEPENDENT_AMBULATORY_CARE_PROVIDER_SITE_OTHER): Payer: Medicare HMO

## 2021-06-29 ENCOUNTER — Telehealth: Payer: Self-pay | Admitting: Primary Care

## 2021-06-29 ENCOUNTER — Encounter: Payer: Self-pay | Admitting: Primary Care

## 2021-06-29 ENCOUNTER — Other Ambulatory Visit: Payer: Self-pay

## 2021-06-29 DIAGNOSIS — R059 Cough, unspecified: Secondary | ICD-10-CM | POA: Diagnosis not present

## 2021-06-29 DIAGNOSIS — J471 Bronchiectasis with (acute) exacerbation: Secondary | ICD-10-CM | POA: Diagnosis not present

## 2021-06-29 DIAGNOSIS — J439 Emphysema, unspecified: Secondary | ICD-10-CM | POA: Diagnosis not present

## 2021-06-29 MED ORDER — DOXYCYCLINE HYCLATE 100 MG PO TABS: 100.0000 mg | ORAL_TABLET | Freq: Two times a day (BID) | ORAL | 0 refills | Status: DC

## 2021-06-29 MED ORDER — GUAIFENESIN ER 600 MG PO TB12: 600.0000 mg | ORAL_TABLET | Freq: Two times a day (BID) | ORAL | 2 refills | Status: AC

## 2021-06-29 NOTE — Assessment & Plan Note (Signed)
-   Patient has a chronic cough with new hemoptysis which started after she stopped taking mucinex regularly. Hemoptysis has resolved. Cough remains productive with thick clear mucus. CXR today showed no acute process. Sending in Rx Doxycycline 100mg  BID x 7 days. Encourage patient consistently take mucinex 600mg  twice daily and use flutter valve three times a day. Sputum sample in December 2022 showed normal oropharyngeal flora
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
## 2021-06-29 NOTE — Progress Notes (Signed)
Reviewed and agree with assessment/plan.   [name], MD [facility] Pulmonary/Critical Care 06/29/2021, 2:02 PM Pager:  [phone]

## 2021-06-29 NOTE — Patient Instructions (Addendum)
Recommendations: Restart mucinex 600mg  1 tablet morning and evening Use flutter valve three times a day every day STRONGLY encourage you quit smoking, I will look into nicotine inhaler and get back to you on this   Rx: Doxycycline 100mg  twice daily x 7 days   Follow-up: March-April with Dr. Halford Chessman (recall should already be in)

## 2021-06-29 NOTE — Assessment & Plan Note (Signed)
-   Does not appear exacerbated - Maintained on Arnuity and Spiriva Handihaler (interant to LABA) - Strongly encourage patient quit smoking

## 2021-06-29 NOTE — Telephone Encounter (Signed)
Any contraindication to using nicotine inhaler in this patient?

## 2021-07-02 IMAGING — CT CT CHEST W/ CM
2 of 4 series · 15 of 36 positions shown, 18 images · IV contrast (OMNIPAQUE)
Comparison: 08/31/2018

CLINICAL DATA: Follow-up small cell lung carcinoma. Status post
chemotherapy and radiation therapy.

EXAM:
CT CHEST WITH CONTRAST
TECHNIQUE: Multidetector CT imaging of the chest was performed during
intravenous contrast administration.
CONTRAST:  75mL OMNIPAQUE IOHEXOL 300 MG/ML  SOLN

[Series 2: axial st · axial · 0.67mm/px · z∈[-306,-54]mm · 12 of 150 slices shown, 15 images]
[im 12/150  mediastinal]
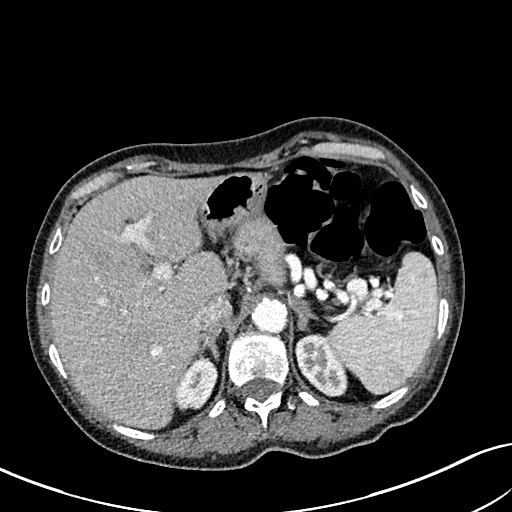
[im 12/150  lung]
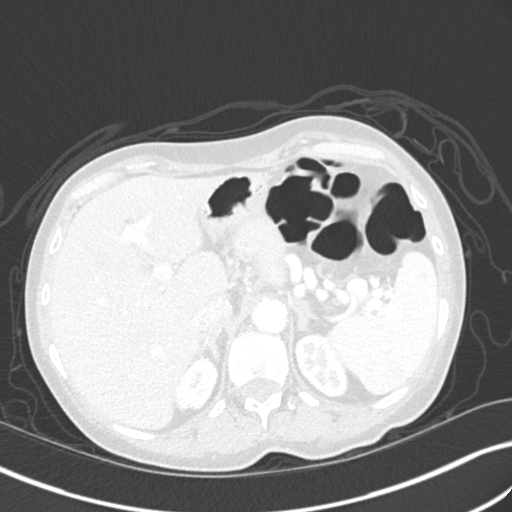
[im 23/150  lung]
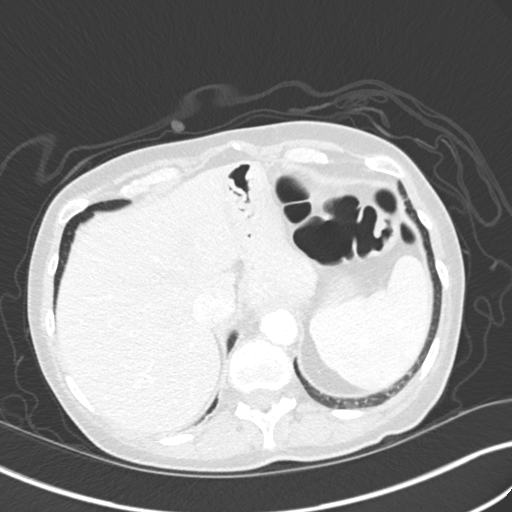
[im 35/150  lung]
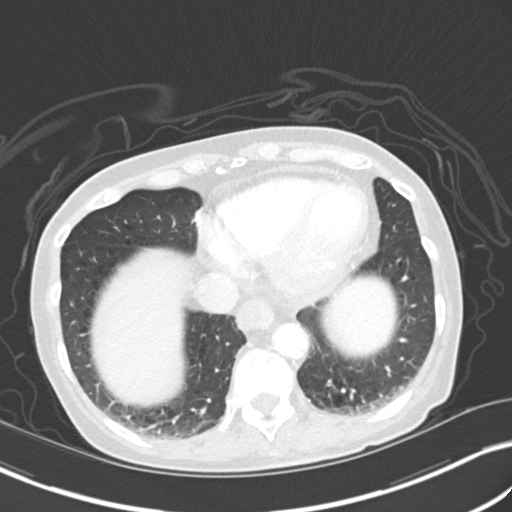
[im 46/150  lung]
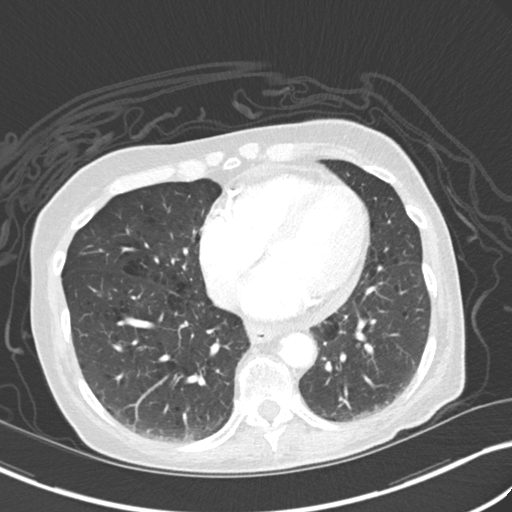
[im 58/150  mediastinal]
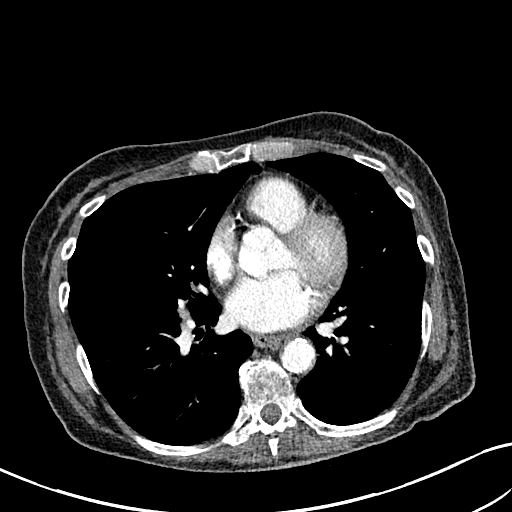
[im 58/150  lung]
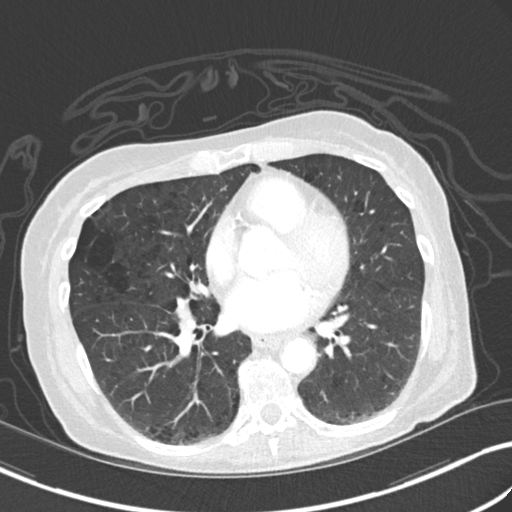
[im 69/150  lung]
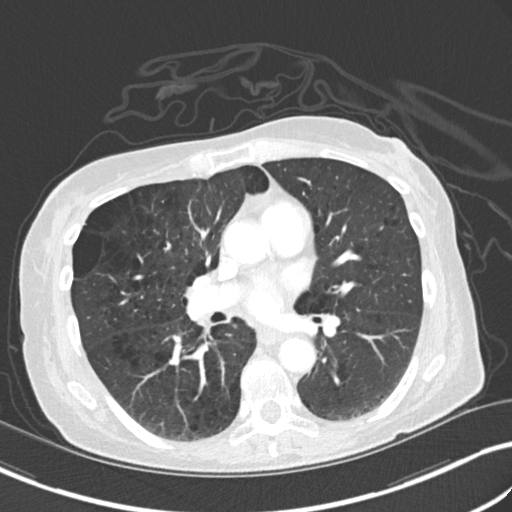
[im 81/150  lung]
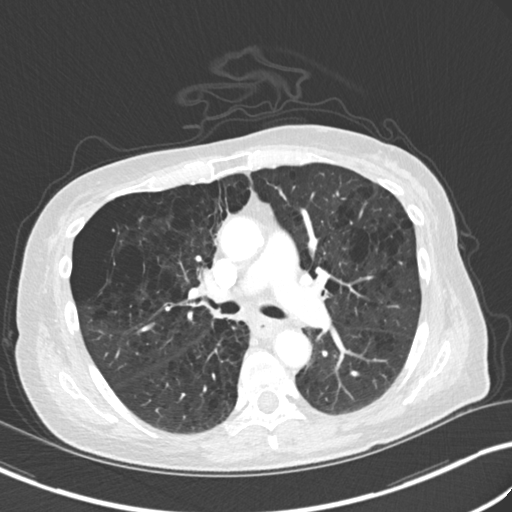
[im 92/150  lung]
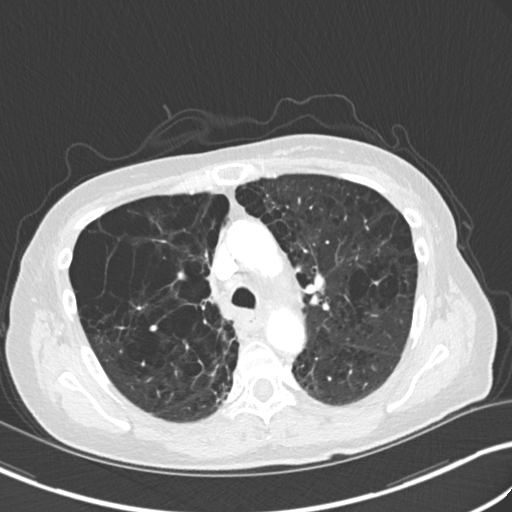
[im 104/150  mediastinal]
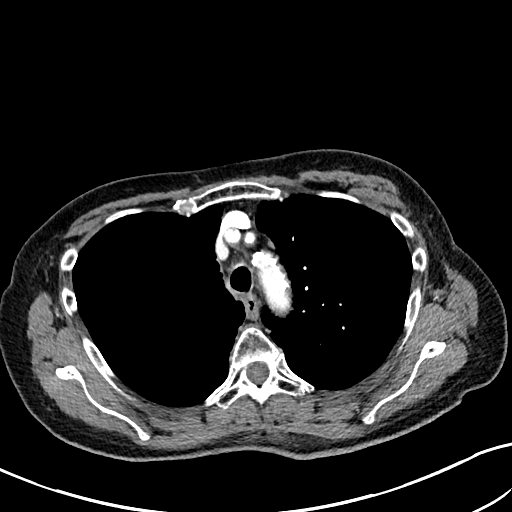
[im 104/150  lung]
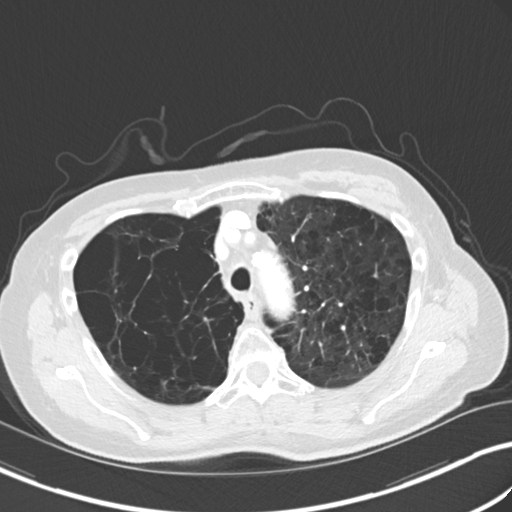
[im 115/150  lung]
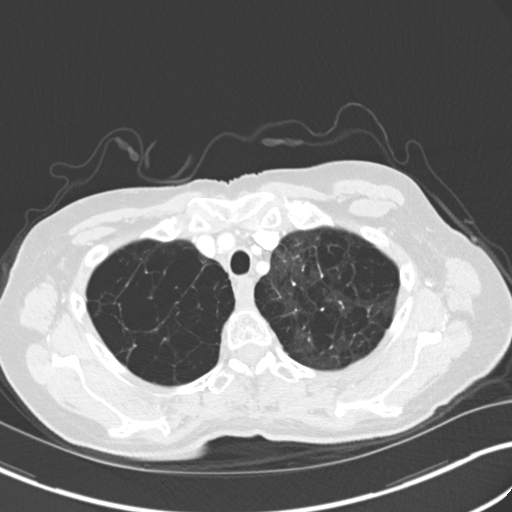
[im 127/150  lung]
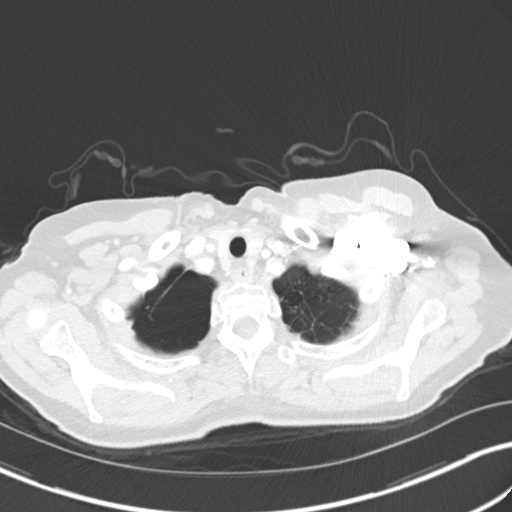
[im 138/150  lung]
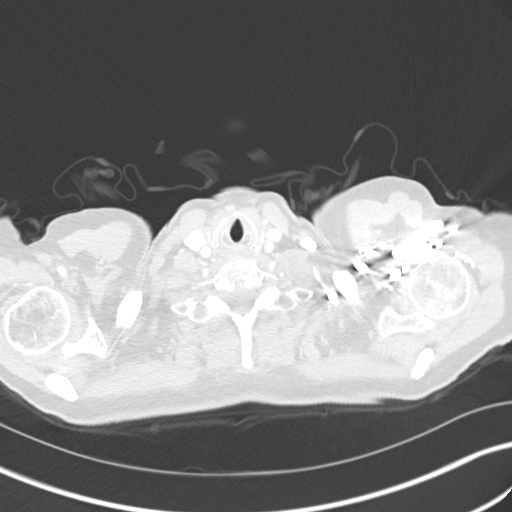

[Series 6: coronal · coronal · 0.62mm/px · 3 of 132 slices shown]
[im 27/132  lung]
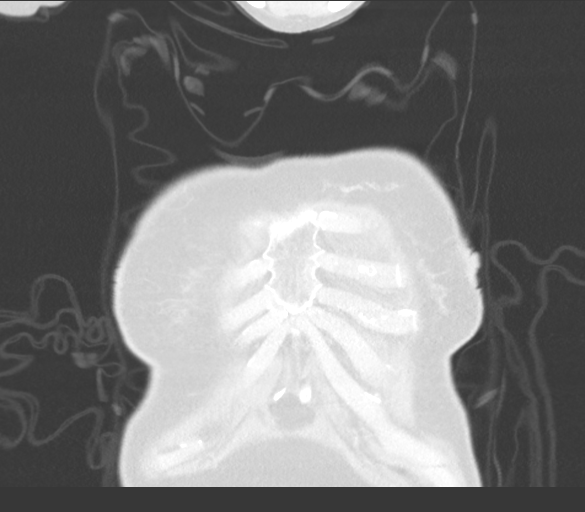
[im 53/132  lung]
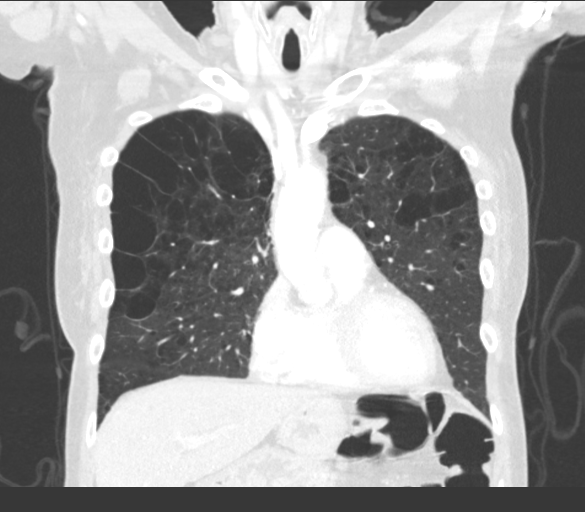
[im 79/132  lung]
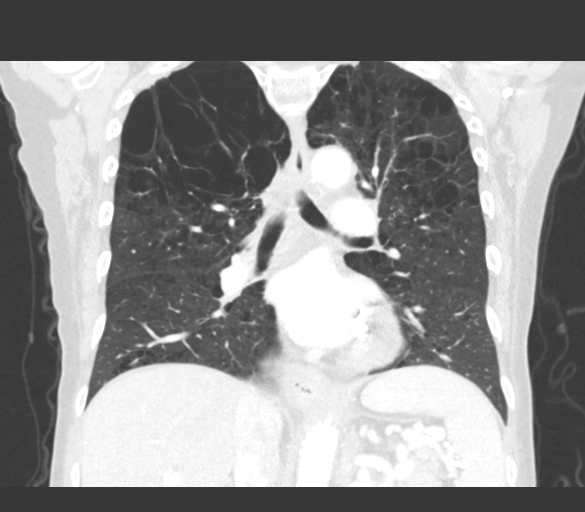

[15 of 36 positions shown; findings below may reference images not displayed]

FINDINGS: Cardiovascular: Aortic and coronary artery atherosclerosis.
Increased mild pericardial thickening.

Mediastinum/Nodes: No masses or pathologically enlarged lymph nodes
identified. Stable diffuse esophageal wall thickening, likely due to
post radiation changes.

Lungs/Pleura: Severe centrilobular emphysema again noted. No
pulmonary infiltrate or mass identified. No effusion present.

Upper Abdomen: Stable small hiatal hernia. Normal appearance of both
adrenal glands.

Musculoskeletal:  No suspicious bone lesions.
IMPRESSION: No evidence of recurrent or metastatic carcinoma within the thorax.

Increased mild pericardial thickening in stable diffuse esophageal
wall thickening, likely due to prior radiation therapy.

Stable small hiatal hernia.

Aortic Atherosclerosis (OU55O-8AX.X) and Emphysema (OU55O-ELA.6).
Coronary artery atherosclerosis.

## 2021-07-02 NOTE — Telephone Encounter (Signed)
Routing to Rph

## 2021-07-04 NOTE — Telephone Encounter (Signed)
Nicotine inhaler use should be cautiosly used in patients with COPD.  NRT in general should be used with cautioned in patients with CV disease specifically history of MI, cardiac arrythmias, HTN - this is risk vs benefit but patient has history of CAD and stable angina which is why it warrants caution  No absolute contraindications to use.  Knox Saliva, PharmD, MPH, BCPS Clinical Pharmacist (Rheumatology and Pulmonology)

## 2021-07-05 ENCOUNTER — Telehealth: Payer: Self-pay | Admitting: Primary Care

## 2021-07-05 MED ORDER — FLUCONAZOLE 150 MG PO TABS: 150.0000 mg | ORAL_TABLET | Freq: Every day | ORAL | 0 refills | Status: DC

## 2021-07-05 NOTE — Telephone Encounter (Signed)
Patient seen 06/29/2021 and prescribed doxycycline at that time.  She will complete abx tonight.  She has developed a yeast infection. C/o itching and burning.  She is eating greek yogurt without relief.    Beth, please advise. thanks

## 2021-07-05 NOTE — Telephone Encounter (Signed)
Attempted to call pt but unable to reach. Left message for her to return call. 

## 2021-07-05 NOTE — Telephone Encounter (Signed)
Routing to TP as BW is not here Thursday afternoons.

## 2021-07-05 NOTE — Telephone Encounter (Signed)
Okay that can happened with abx.  Can take Diflucan 150mg  x 1 dose . If not improving will need ov with PCP or GYN .

## 2021-07-05 NOTE — Telephone Encounter (Signed)
I called the patient and she is agreeable to the medication per [facility] NP. It has been sent to the pharmacy and the patient is aware. The patient is also aware that she will need to follow up with her PCP or her OBGYN if her symptoms are not better.

## 2021-07-05 NOTE — Telephone Encounter (Signed)
Please contact office for sooner follow up if symptoms do not improve or worsen or seek emergency care

## 2021-07-05 NOTE — Telephone Encounter (Signed)
Please let patient know that we typically avoid nicotine inhalers in patient who have COPD, would not recommend use.

## 2021-07-06 ENCOUNTER — Telehealth: Payer: Self-pay | Admitting: Primary Care

## 2021-07-06 ENCOUNTER — Telehealth: Payer: Self-pay | Admitting: Adult Health

## 2021-07-06 NOTE — Telephone Encounter (Signed)
Called and spoke with patient and let her know to not to take diflucan and to take OTC yeast medications. No questions noted from patient. Nothing further

## 2021-07-06 NOTE — Telephone Encounter (Signed)
Tell her to not take then and use otc yeast medications.  May also see PCP or gyn  Please contact office for sooner follow up if symptoms do not improve or worsen or seek emergency care

## 2021-07-06 NOTE — Telephone Encounter (Signed)
Called and notified patient of EW Nps response regarding nicotine inhaler. She voiced understanding. Nothing further needed.

## 2021-07-06 NOTE — Telephone Encounter (Signed)
Called and spoke with patient about diflucan and she is worried about side effects!   She is worried about heart palpations and seizures. She was reading the pamphlet on the medication.  Please advise Michele [name]

## 2021-07-09 ENCOUNTER — Telehealth: Payer: Self-pay | Admitting: Primary Care

## 2021-07-09 ENCOUNTER — Other Ambulatory Visit: Payer: Self-pay | Admitting: Primary Care

## 2021-07-09 MED ORDER — LEVALBUTEROL TARTRATE 45 MCG/ACT IN AERO: INHALATION_SPRAY | RESPIRATORY_TRACT | 1 refills | Status: DC

## 2021-07-09 MED ORDER — LEVALBUTEROL TARTRATE 45 MCG/ACT IN AERO: INHALATION_SPRAY | RESPIRATORY_TRACT | 2 refills | Status: DC

## 2021-07-09 NOTE — Telephone Encounter (Signed)
Lm for patient.  

## 2021-07-09 NOTE — Telephone Encounter (Signed)
Spoke with the pt to verify msg  She is needing levalbuterol hfa refilled  Rx sent  Nothing further needed

## 2021-07-10 ENCOUNTER — Other Ambulatory Visit: Payer: Self-pay

## 2021-07-10 ENCOUNTER — Ambulatory Visit
Admission: RE | Admit: 2021-07-10 | Discharge: 2021-07-10 | Disposition: A | Discharge status: Home / Self Care | Payer: Medicare HMO | Source: Ambulatory Visit | Attending: Family Medicine | Admitting: Family Medicine

## 2021-07-10 DIAGNOSIS — Z1231 Encounter for screening mammogram for malignant neoplasm of breast: Secondary | ICD-10-CM

## 2021-07-12 ENCOUNTER — Ambulatory Visit: Payer: Medicare HMO

## 2021-07-13 ENCOUNTER — Telehealth: Payer: Self-pay | Admitting: Primary Care

## 2021-07-15 IMAGING — DX DG CHEST 2V
2 series · 2 of 2 positions shown · non-contrast
Comparison: CT chest 03/05/2019.  Chest x-ray 04/27/2018.

CLINICAL DATA: Cough.  Lung pain.

EXAM:
CHEST - 2 VIEW

[chest pa]
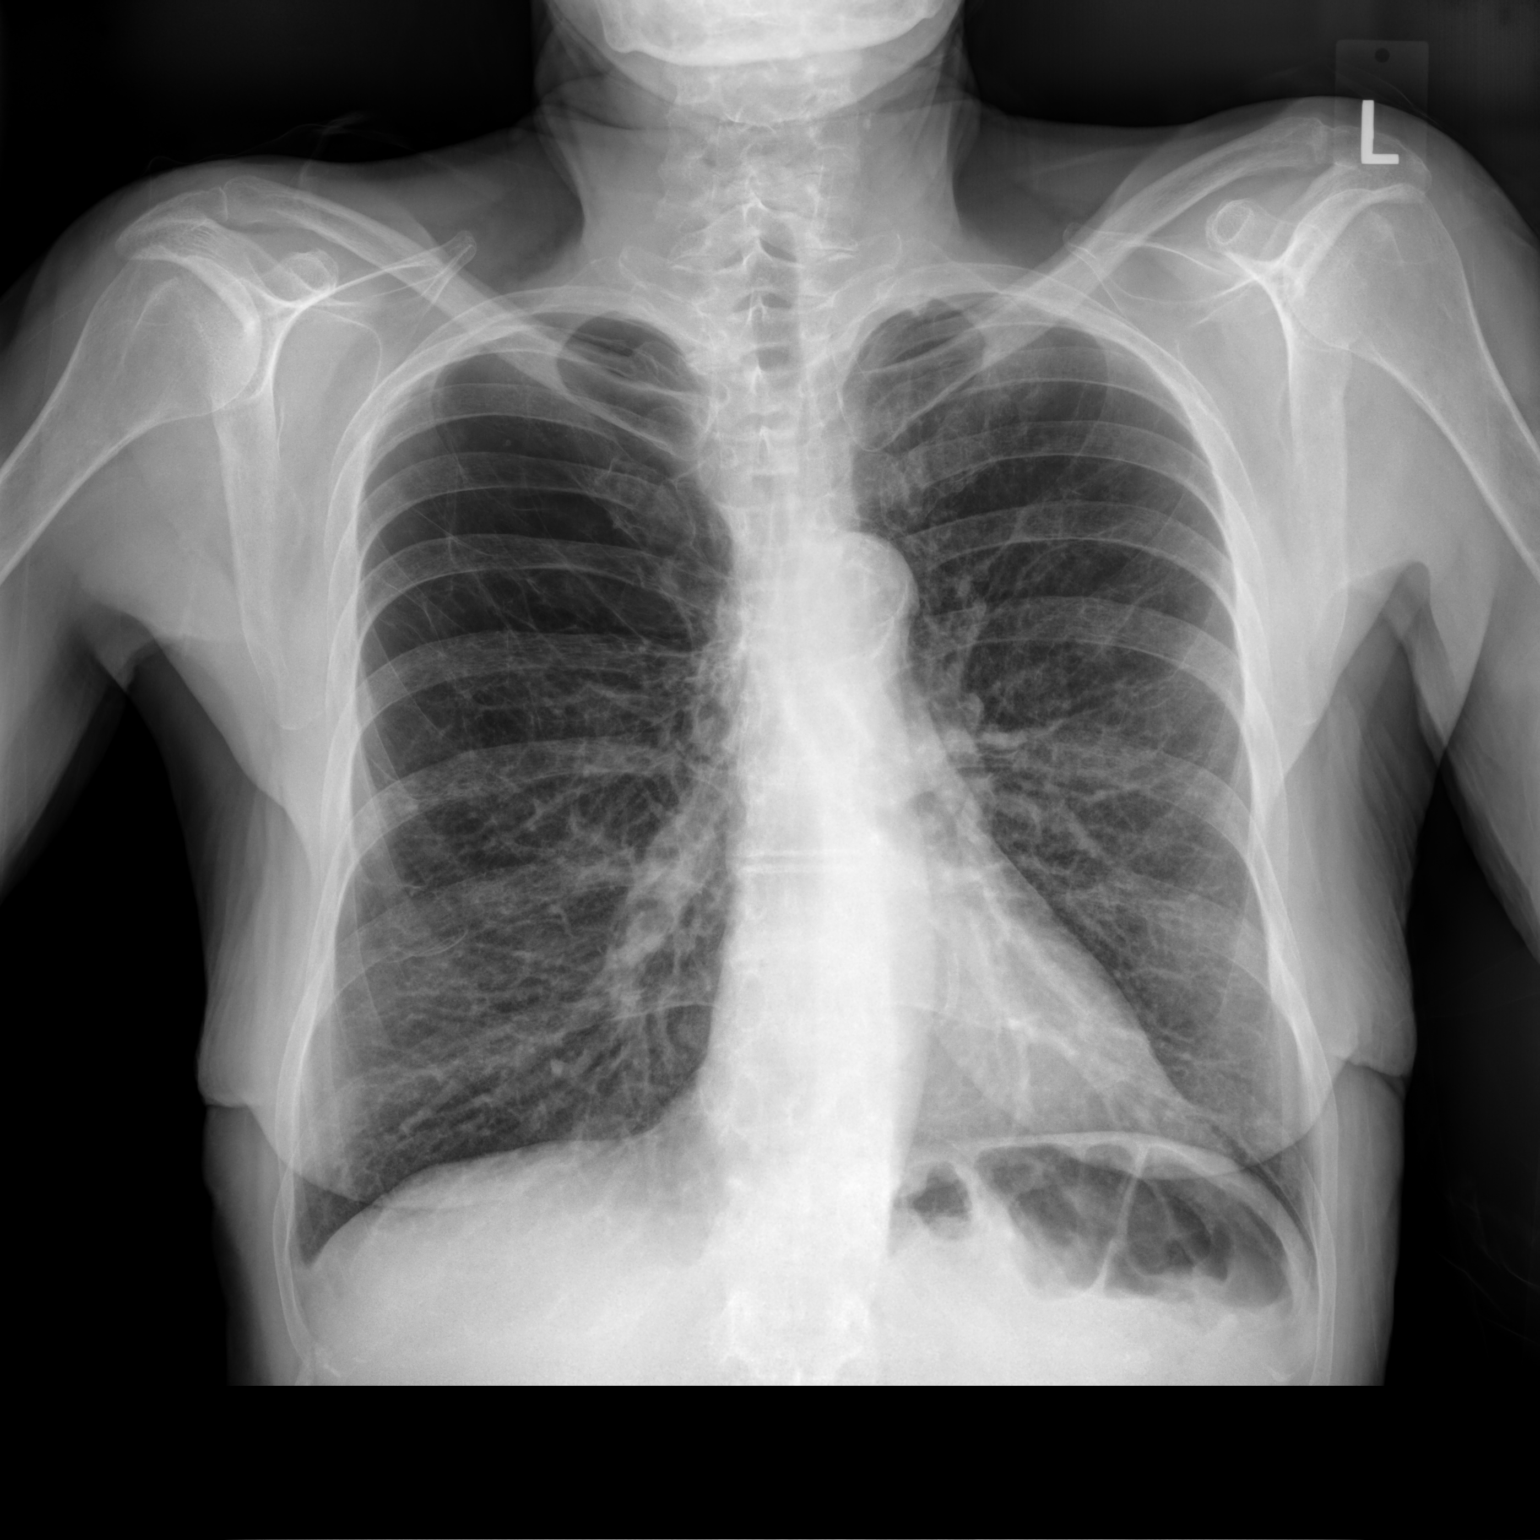

[chest lat]
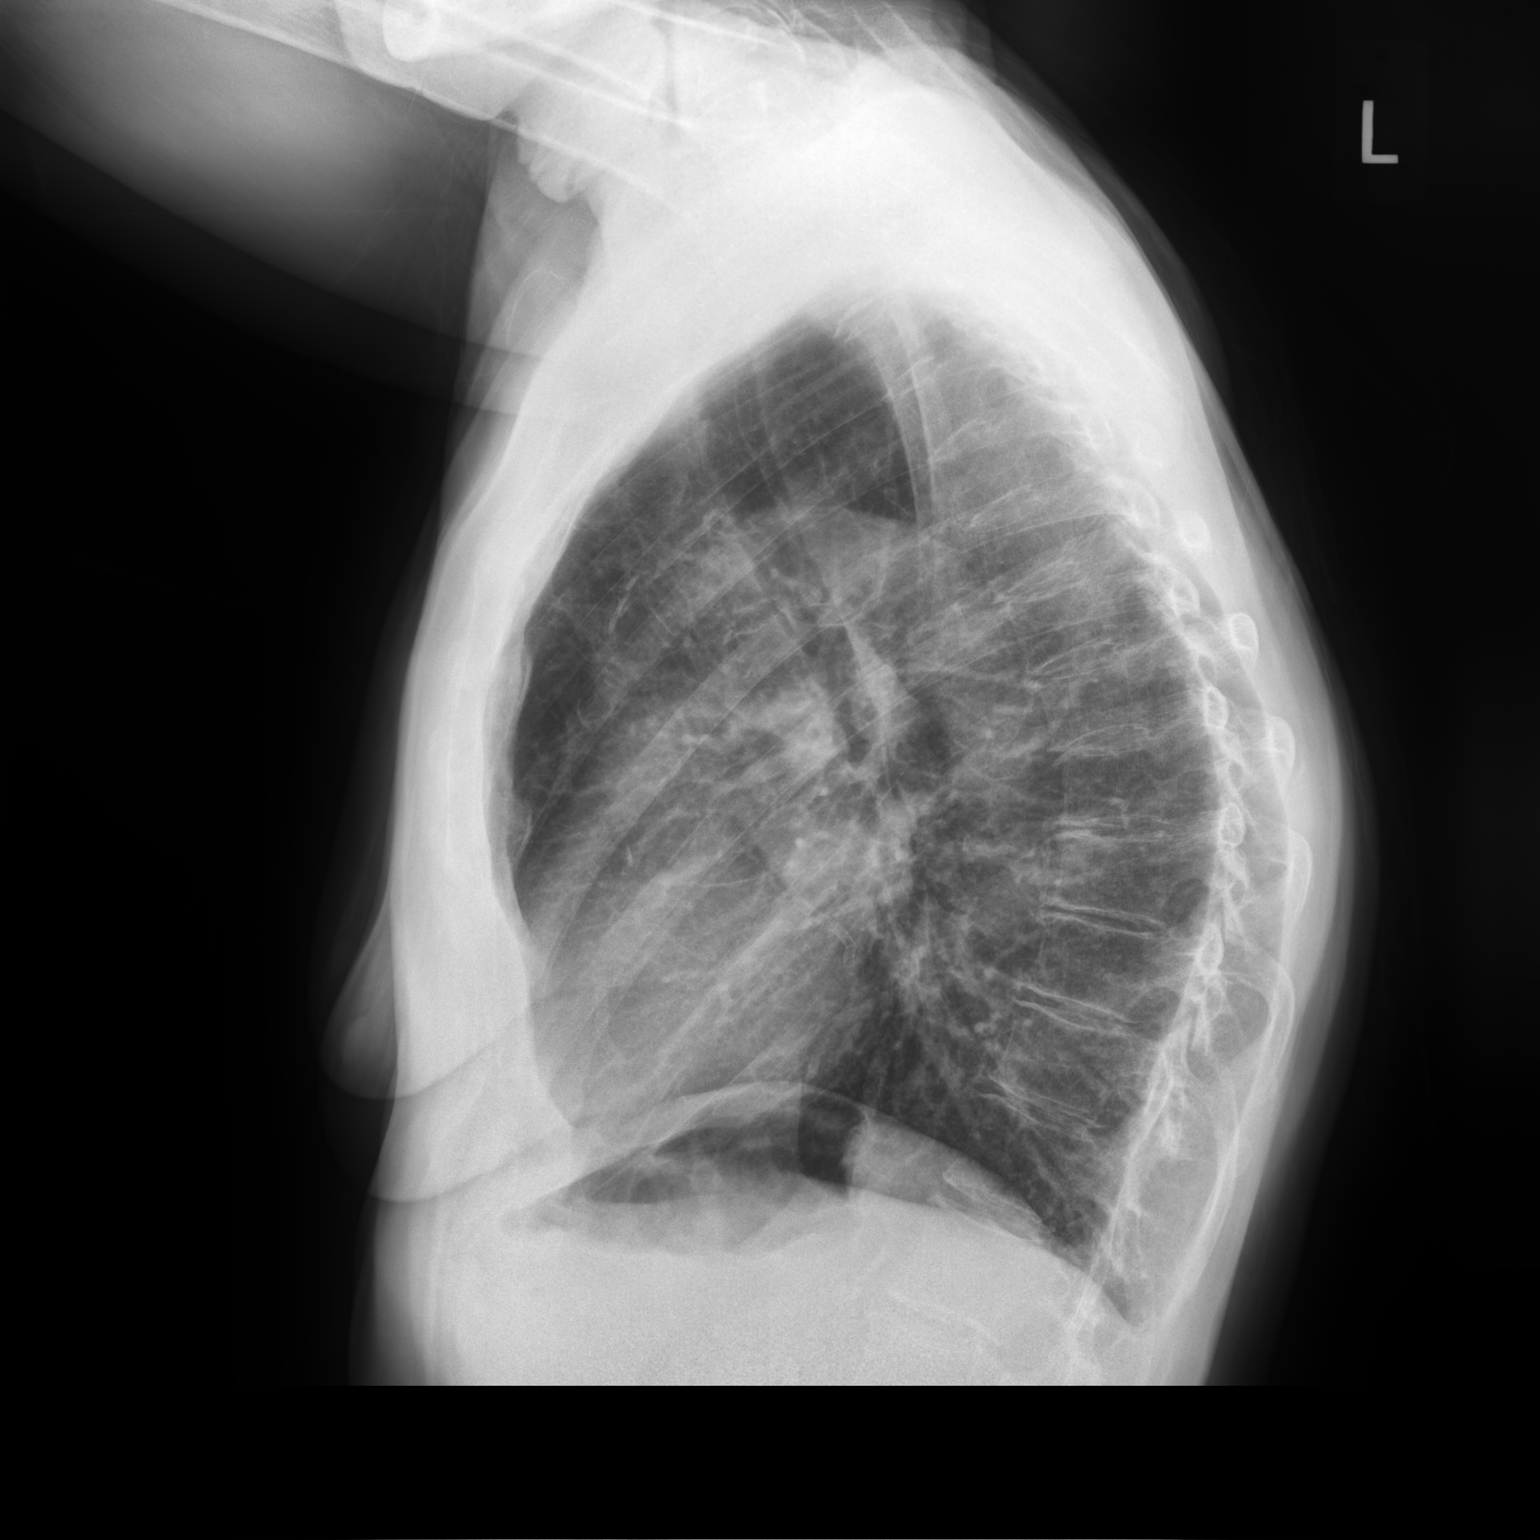

[2 of 2 positions shown; findings below may reference images not displayed]

FINDINGS: Mediastinum and hilar structures normal. Severe bullous COPD. Lungs
are clear. Mild right base pleural thickening again noted consistent
scarring. Pneumothorax. Heart size normal. Degenerative change
thoracic spine.
IMPRESSION: Severe bullous COPD.  No acute cardiopulmonary disease.

## 2021-07-16 ENCOUNTER — Other Ambulatory Visit (HOSPITAL_COMMUNITY): Payer: Self-pay

## 2021-07-16 NOTE — Telephone Encounter (Signed)
Ok. I ran a test claim for Levalbuterol. It says step therapy required. Will follow up when fax is received

## 2021-07-18 ENCOUNTER — Telehealth: Payer: Self-pay

## 2021-07-18 ENCOUNTER — Other Ambulatory Visit (HOSPITAL_COMMUNITY): Payer: Self-pay

## 2021-07-18 NOTE — Telephone Encounter (Signed)
Patient Advocate Encounter   Received notification from [facility] that prior authorization for Levalbuterol Tartrate 34mcg is required by his/her insurance [employer].   PA submitted on 07/18/21  Key#: [id number]  Status is pending    [facility] will continue to follow:  Patient Advocate Fax: [phone]

## 2021-07-19 ENCOUNTER — Other Ambulatory Visit (HOSPITAL_COMMUNITY): Payer: Self-pay

## 2021-07-19 ENCOUNTER — Ambulatory Visit: Payer: Medicare HMO

## 2021-07-19 NOTE — Telephone Encounter (Addendum)
Received notification from [facility]) regarding a prior authorization for . Authorization has been APPROVED from 1.1.23 to 12.31.23.   Authorization#PA Case ID: [id number]

## 2021-07-21 ENCOUNTER — Other Ambulatory Visit: Payer: Self-pay | Admitting: Family

## 2021-07-24 ENCOUNTER — Telehealth: Payer: Self-pay | Admitting: Cardiology

## 2021-07-24 NOTE — Telephone Encounter (Signed)
Pt c/o medication issue:  1. Name of Medication:  furosemide (LASIX) 20 MG tablet  2. How are you currently taking this medication (dosage and times per day)? Not currently taking  3. Are you having a reaction (difficulty breathing--STAT)? No   4. What is your medication issue? Wants to know if she should take medication or not. Reports [name] advised her to take it if she gains 3 lbs and she has gained 4 lbs. Also reports she is dehydrated and another provider advised her she is needing labs at her appt with our office tomorrow to check her liver and kidneys due to frequent urination.

## 2021-07-24 NOTE — Telephone Encounter (Signed)
Spoke to patient . Patient wanted to know if she needed to Lasix 20 mg  with weight increase. Patient states her weight today now is 135 lb, earlier today was 134 lbs.    Last week it was 137 lbs 3 days in a row.   Patient did not know what her dry weight should be.  Per last office visit 06/29/21 weight was 133 lbs.   Patient did state weight is around 133 to 134 lbs.  RN informed patient  per  conversation . Patient weight today is not 3 lbs above her standard weight. So she does not need to take extra dose of lasix 20 mg.   If weight goes up to 136 or higher  she can take an extra Lasix 20 mg.   Patient also states she is having stress incontinence but none today . Patient wanted Dr [name]  to do something about this By ordering labs at appointment tomorrow.  Patient states she saw a Dealer  ( Dr Tresa Moore) recently . She states she received some medication but has not started it yet,(she forgot she had it).  RN informed patient to start taking medication , if she cannot remember direction call Dr Zettie Pho office for instructions.  Patient also states she is not sleeping  wanted to know what to do. RN informed patient  to  contact primary. Patient also states she having chest around her heart or hear it when she lays down. RN informed patient she can discuss this with Dr [name]  tomorrow.  Patient verbalized understanding.

## 2021-07-25 ENCOUNTER — Telehealth: Payer: Self-pay | Admitting: *Deleted

## 2021-07-25 ENCOUNTER — Other Ambulatory Visit: Payer: Self-pay

## 2021-07-25 ENCOUNTER — Ambulatory Visit (INDEPENDENT_AMBULATORY_CARE_PROVIDER_SITE_OTHER): Payer: Medicare HMO | Admitting: Cardiology

## 2021-07-25 ENCOUNTER — Encounter: Payer: Self-pay | Admitting: Cardiology

## 2021-07-25 VITALS — BP 116/74 | HR 90 | Ht 65.0 in | Wt 136.6 lb

## 2021-07-25 DIAGNOSIS — I951 Orthostatic hypotension: Secondary | ICD-10-CM

## 2021-07-25 DIAGNOSIS — Z9861 Coronary angioplasty status: Secondary | ICD-10-CM | POA: Diagnosis not present

## 2021-07-25 DIAGNOSIS — I252 Old myocardial infarction: Secondary | ICD-10-CM

## 2021-07-25 DIAGNOSIS — I201 Angina pectoris with documented spasm: Secondary | ICD-10-CM | POA: Diagnosis not present

## 2021-07-25 DIAGNOSIS — I2089 Other forms of angina pectoris: Secondary | ICD-10-CM

## 2021-07-25 DIAGNOSIS — I25118 Atherosclerotic heart disease of native coronary artery with other forms of angina pectoris: Secondary | ICD-10-CM | POA: Diagnosis not present

## 2021-07-25 DIAGNOSIS — I208 Other forms of angina pectoris: Secondary | ICD-10-CM | POA: Diagnosis not present

## 2021-07-25 DIAGNOSIS — Z720 Tobacco use: Secondary | ICD-10-CM

## 2021-07-25 DIAGNOSIS — I2510 Atherosclerotic heart disease of native coronary artery without angina pectoris: Secondary | ICD-10-CM | POA: Diagnosis not present

## 2021-07-25 DIAGNOSIS — R002 Palpitations: Secondary | ICD-10-CM

## 2021-07-25 DIAGNOSIS — I5032 Chronic diastolic (congestive) heart failure: Secondary | ICD-10-CM

## 2021-07-25 DIAGNOSIS — F17210 Nicotine dependence, cigarettes, uncomplicated: Secondary | ICD-10-CM | POA: Diagnosis not present

## 2021-07-25 DIAGNOSIS — I2119 ST elevation (STEMI) myocardial infarction involving other coronary artery of inferior wall: Secondary | ICD-10-CM

## 2021-07-25 DIAGNOSIS — E785 Hyperlipidemia, unspecified: Secondary | ICD-10-CM | POA: Diagnosis not present

## 2021-07-25 MED ORDER — NITROGLYCERIN 0.4 MG SL SUBL: 0.4000 mg | SUBLINGUAL_TABLET | SUBLINGUAL | 3 refills | Status: DC | PRN

## 2021-07-25 NOTE — Telephone Encounter (Signed)
Patient had an appointment earlier today. Patient came back to  office with a question about asleep aide.   ( Filled out a yellow sheet - walk-in  form)     RN called number given -  no answer ,left message  on voicemail   Dr [name] does not prescribe sleep aide  , will needed to contact primary doctor.  She can try generic benadryl over the counter.  Recommendation is to contact  primary provider. Any question may call back .
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
## 2021-07-25 NOTE — Patient Instructions (Addendum)
Medication Instructions:   Sliding scale for with 3 lb or more weight gain  Your weight at home should be 132 to 133 lbs  if you are above 136 and have swelling  - you can take lasix ( furosemide) 20 mg    Medication refilled NTG  *If you need a refill on your cardiac medications before your next appointment, please call your pharmacy*   Lab Work: [place]  If you have labs (blood work) drawn today and your tests are completely normal, you will receive your results only by: [place] (if you have MyChart) OR A paper copy in the mail If you have any lab test that is abnormal or we need to change your treatment, we will call you to review the results.   Testing/Procedures: Not needed   Follow-Up: At [facility], you and your health needs are our priority.  As part of our continuing mission to provide you with exceptional heart care, we have created designated Provider Care Teams.  These Care Teams include your primary Cardiologist (physician) and Advanced Practice Providers (APPs -  Physician Assistants and Nurse Practitioners) who all work together to provide you with the care you need, when you need it.     Your next appointment:   6 month(s)  The format for your next appointment:   In Person  Provider:   Jory Sims, DNP, ANP    Then, Glenetta Hew, MD will plan to see you again in 12 month(s).   Other Instructions  Discuss with your Gynecologist about your incontinence (urinating without notice)

## 2021-07-26 LAB — LIPID PANEL
Chol/HDL Ratio: 2.4 ratio (ref 0.0–4.4)
Cholesterol, Total: 122 mg/dL (ref 100–199)
HDL: 51 mg/dL (ref >39)
LDL Chol Calc (NIH): 54 mg/dL (ref 0–99)
Triglycerides: 88 mg/dL (ref 0–149)
VLDL Cholesterol Cal: 17 mg/dL (ref 5–40)

## 2021-07-26 LAB — COMPREHENSIVE METABOLIC PANEL
ALT: 17 IU/L (ref 0–32)
AST: 20 IU/L (ref 0–40)
Albumin/Globulin Ratio: 2.2 (ref 1.2–2.2)
Albumin: 4.4 g/dL (ref 3.8–4.8)
Alkaline Phosphatase: 84 IU/L (ref 44–121)
BUN/Creatinine Ratio: 22 (ref 12–28)
BUN: 15 mg/dL (ref 8–27)
Bilirubin Total: 0.3 mg/dL (ref 0.0–1.2)
CO2: 21 mmol/L (ref 20–29)
Calcium: 9.7 mg/dL (ref 8.7–10.3)
Chloride: 103 mmol/L (ref 96–106)
Creatinine, Ser: 0.69 mg/dL (ref 0.57–1.00)
Globulin, Total: 2 g/dL (ref 1.5–4.5)
Glucose: 97 mg/dL (ref 70–99)
Potassium: 4.7 mmol/L (ref 3.5–5.2)
Sodium: 142 mmol/L (ref 134–144)
Total Protein: 6.4 g/dL (ref 6.0–8.5)
eGFR: 95 mL/min/{1.73_m2} (ref >59)

## 2021-07-26 LAB — CBC
Hematocrit: 43.5 % (ref 34.0–46.6)
Hemoglobin: 14.5 g/dL (ref 11.1–15.9)
MCH: 30.3 pg (ref 26.6–33.0)
MCHC: 33.3 g/dL (ref 31.5–35.7)
MCV: 91 fL (ref 79–97)
Platelets: 226 10*3/uL (ref 150–450)
RBC: 4.79 x10E6/uL (ref 3.77–5.28)
RDW: 13.5 % (ref 11.7–15.4)
WBC: 6.8 10*3/uL (ref 3.4–10.8)

## 2021-08-13 ENCOUNTER — Telehealth: Payer: Self-pay | Admitting: Primary Care

## 2021-08-13 IMAGING — MR MR HEAD WO/W CM
9 of 12 series · 25 of 48 positions shown · IV contrast (cc gad)
Comparison: MRI head 09/28/2018

CLINICAL DATA: Small cell lung cancer. Prophylactic whole-brain
radiotherapy.

EXAM:
MRI HEAD WITHOUT AND WITH CONTRAST
TECHNIQUE: Multiplanar, multiecho pulse sequences of the brain and surrounding
structures were obtained without and with intravenous contrast.
CONTRAST:  5mL GADAVIST GADOBUTROL 1 MMOL/ML IV SOLN

[Series 2: FLAIR · sagittal · 3.0mm · 0.47mm/px · 2 of 48 slices shown (1 of 2)]
[im 1/48]
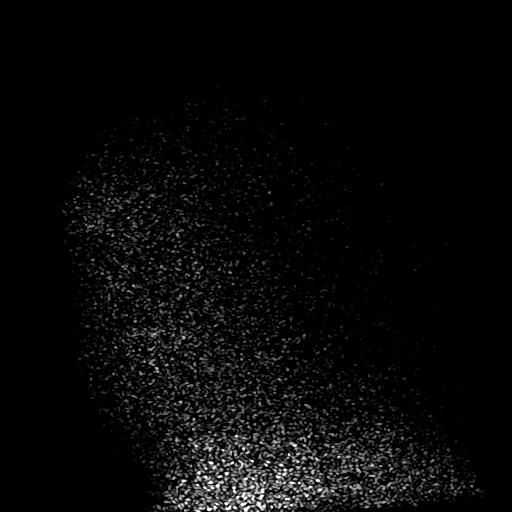
[im 48/48]
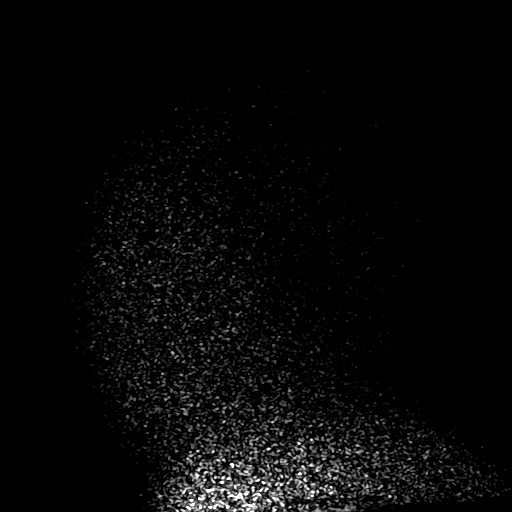

[Series 3: DWI · axial · 3.0mm · 1.09mm/px · z∈[-48,+128]mm · 5 of 122 slices shown]
[im 1/122]
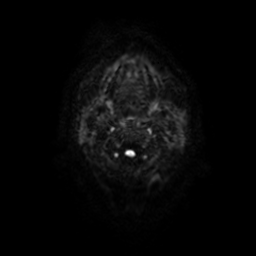
[im 31/122]
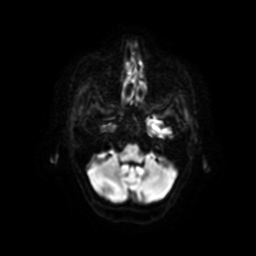
[im 61/122]
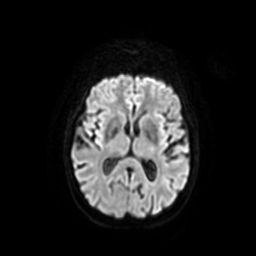
[im 91/122]
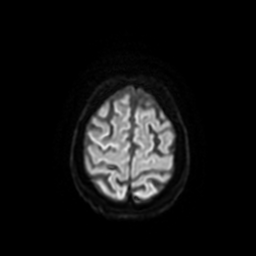
[im 122/122]
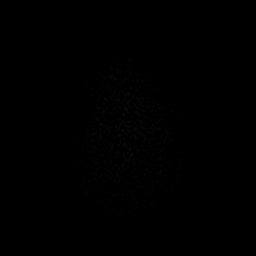

[Series 4: FLAIR · axial · 3.0mm · 0.47mm/px · z∈[-99,+90]mm · 3 of 64 slices shown (2 of 2)]
[im 1/64]
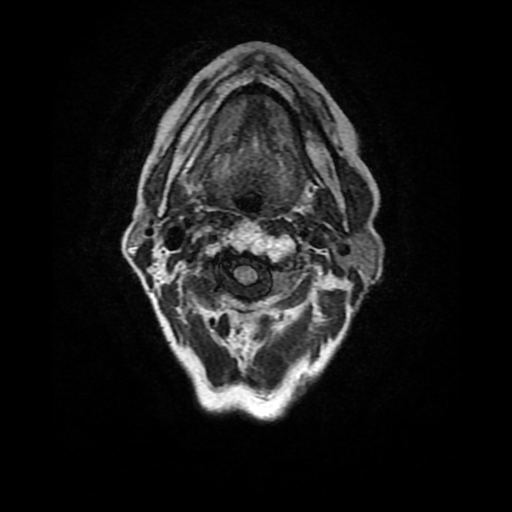
[im 32/64]
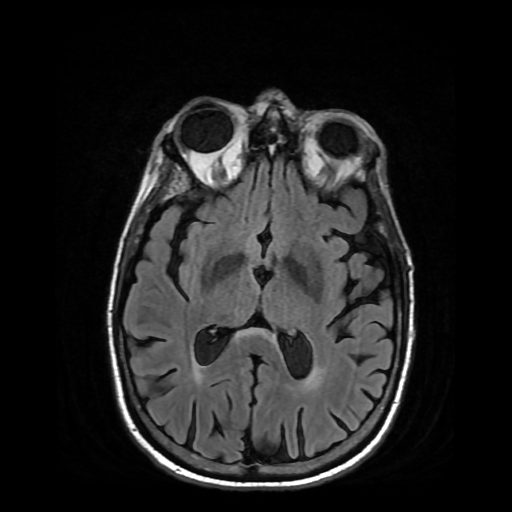
[im 64/64]
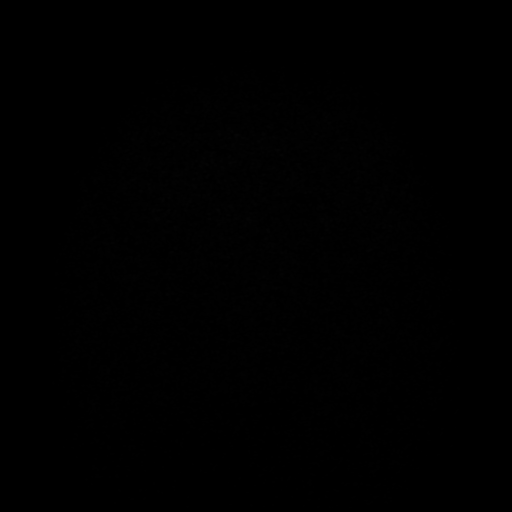

[Series 6: T2 · axial · 5.0mm · 0.47mm/px · 1 of 30 slices shown]
[im 1/30]
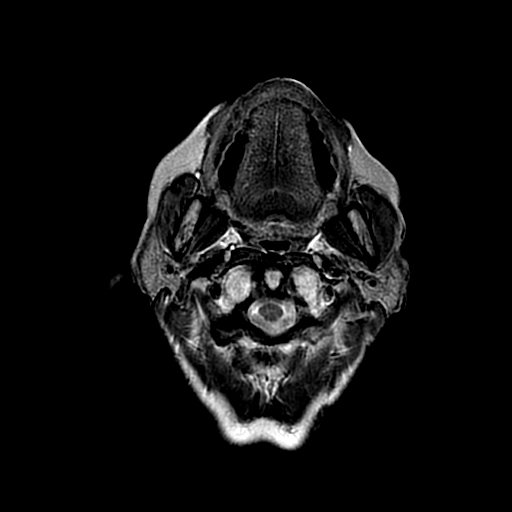

[Series 8: T2 post-contrast · coronal · 3.0mm · 0.43mm/px · 3 of 59 slices shown]
[im 1/59]
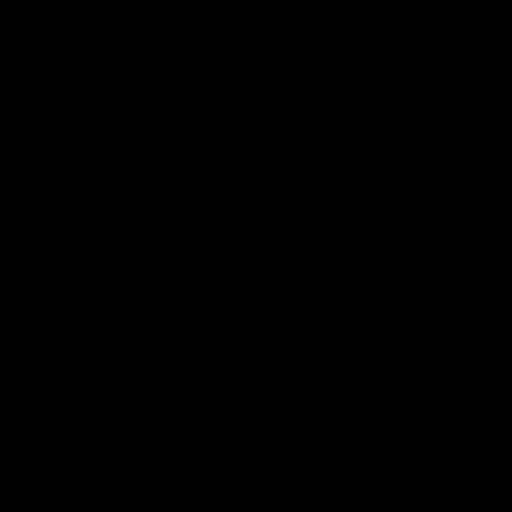
[im 30/59]
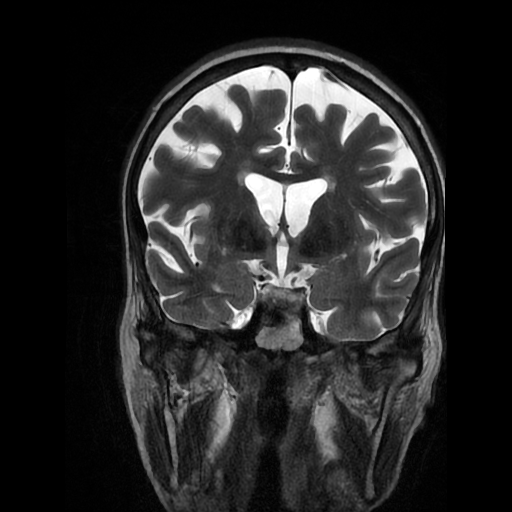
[im 59/59]
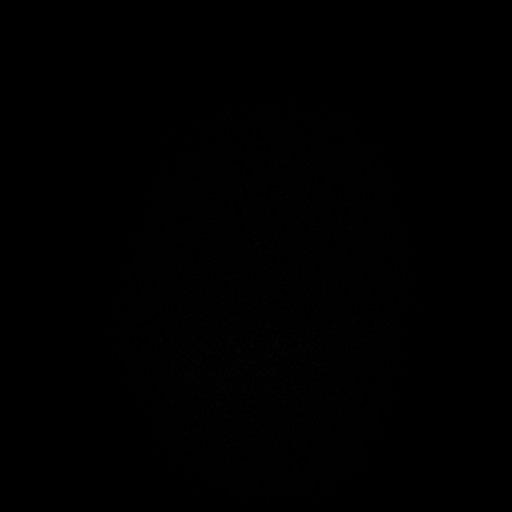

[Series 10: T1 post-contrast · coronal · 3.0mm · 0.43mm/px · 3 of 59 slices shown]
[im 1/59]
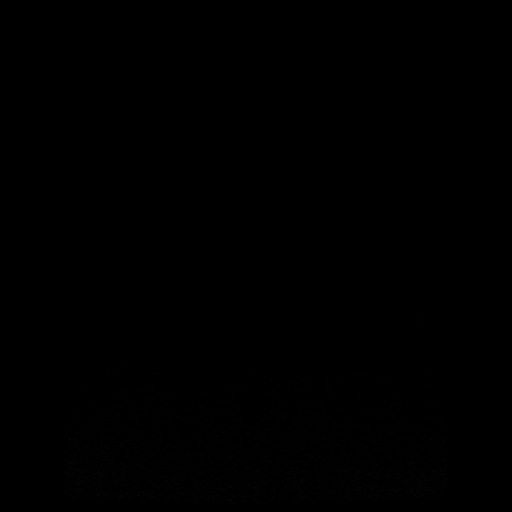
[im 30/59]
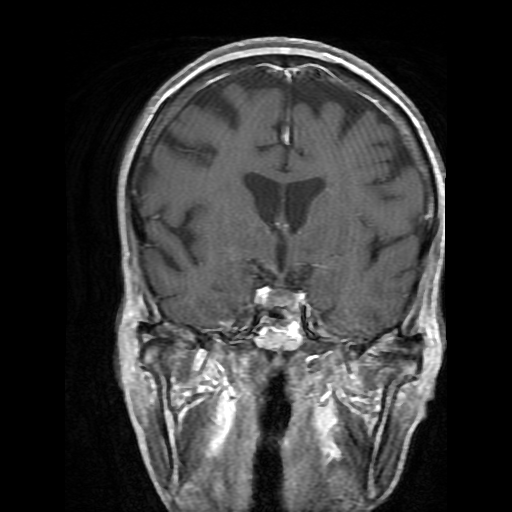
[im 59/59]
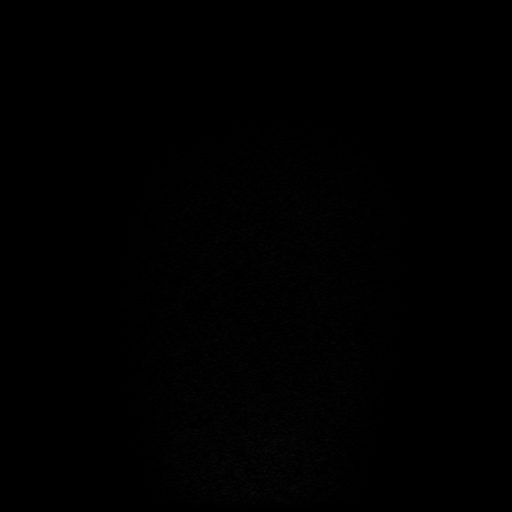

[Series 11: FLAIR post-contrast · sagittal · 3.0mm · 0.47mm/px · 2 of 49 slices shown]
[im 1/49]
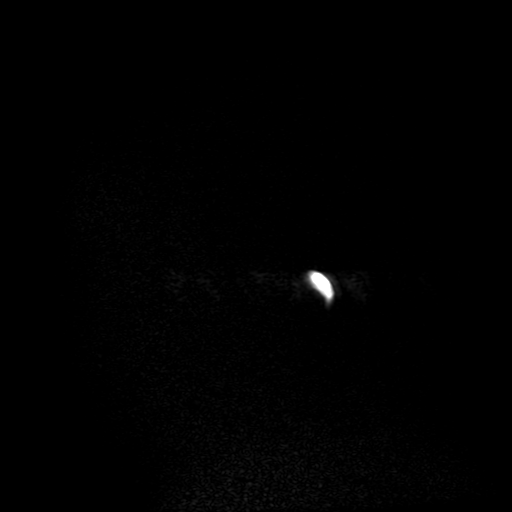
[im 49/49]
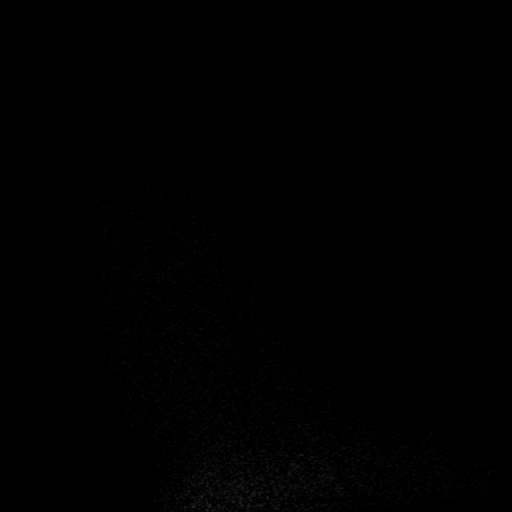

[Series 12: SWI · axial · 3.0mm · 0.47mm/px · z∈[-63,+21]mm · 3 of 116 slices shown]
[im 1/116]
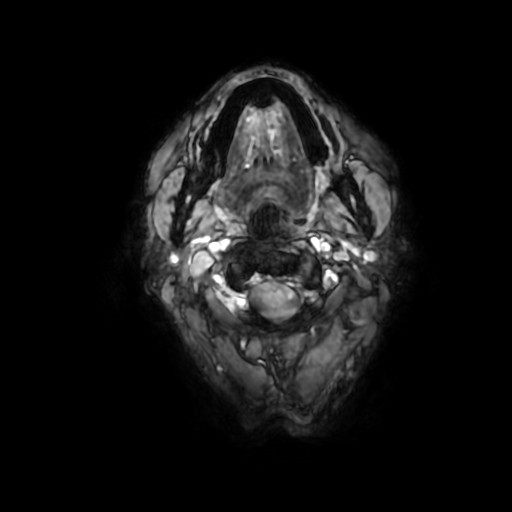
[im 29/116]
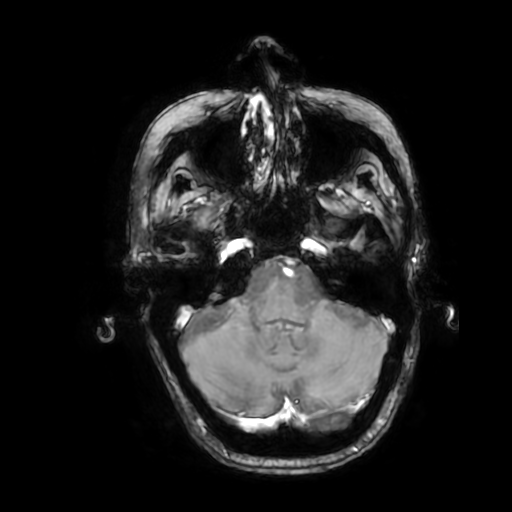
[im 58/116]
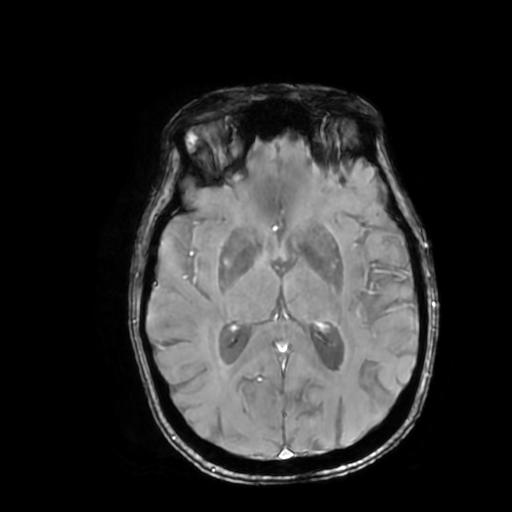

[Series 350: ADC · axial · 3.0mm · 1.09mm/px · z∈[-48,+119]mm · 3 of 58 slices shown]
[im 1/58]
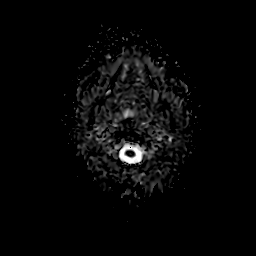
[im 29/58]
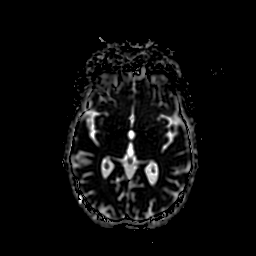
[im 58/58]
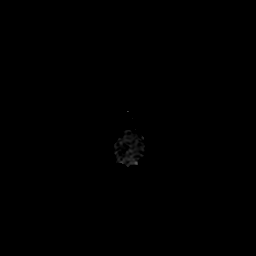

[25 of 48 positions shown; findings below may reference images not displayed]

FINDINGS: Brain: Mild atrophy unchanged. Mild white matter changes bilaterally
stable.

Negative for acute infarct.  Negative for hemorrhage or mass

No enhancing lesions postcontrast administration.

Vascular: Normal arterial flow voids.

Skull and upper cervical spine: Negative

Sinuses/Orbits: Negative

Other: None
IMPRESSION: Stable examination.  No evidence of metastatic disease

Mild atrophy and mild chronic white matter changes are stable.

## 2021-08-14 NOTE — Telephone Encounter (Signed)
ATC [name] with [facility]. Call was disconnected during transfer. Will call back.  ?

## 2021-08-14 NOTE — Telephone Encounter (Signed)
Lm for Nurse with [facility].  ?

## 2021-08-15 ENCOUNTER — Other Ambulatory Visit: Payer: Self-pay | Admitting: Family Medicine

## 2021-08-15 DIAGNOSIS — E2839 Other primary ovarian failure: Secondary | ICD-10-CM

## 2021-08-16 NOTE — Telephone Encounter (Signed)
Called and spoke with the pt  ?She is requesting that we send in rx for nicotrol inhaler  ?She states that she has done well with this before  ?She is currently smoking 1 ppd and can not take the patches or gum bc they break her out in hives  ?Beth, please advise on nicotrol rx, thanks! ?

## 2021-08-16 NOTE — Telephone Encounter (Signed)
Called the pt to discuss, there was no answer- LMTCB.  ?

## 2021-08-16 NOTE — Telephone Encounter (Signed)
Patient is returning phone call. Patient phone number is [phone]. ?

## 2021-08-20 ENCOUNTER — Telehealth: Payer: Self-pay | Admitting: Cardiology

## 2021-08-20 ENCOUNTER — Telehealth: Payer: Self-pay | Admitting: Pulmonary Disease

## 2021-08-20 MED ORDER — NICOTINE 10 MG IN INHA: 1.0000 | RESPIRATORY_TRACT | 0 refills | Status: DC | PRN

## 2021-08-20 NOTE — Telephone Encounter (Signed)
Continue tobacco smoking is likely more detrimental to her cardiovascular health compared to nicatrol inhaler use, especially if this ultimately helps her stop smoking.  Okay to send script for nicatrol inhaler. ?

## 2021-08-20 NOTE — Telephone Encounter (Signed)
Patient calling to see if Dr [name] can prescribe the nicotrol inhaler to her. Please advise ?

## 2021-08-20 NOTE — Telephone Encounter (Signed)
Patient requested a prescription for nicotrol inhaler. Recommended that she call PCP to get that prescritpion. She said okay. ?

## 2021-08-20 NOTE — Telephone Encounter (Signed)
Called and spoke with patient. Sent in nicotrol inhaler.  ?

## 2021-08-20 NOTE — Telephone Encounter (Signed)
Attempted to call patient but she did not answer. Left a voicemail for patient to call us back.  ?

## 2021-08-20 NOTE — Telephone Encounter (Signed)
Spoke with pt and reviewed [place] recommendations. Pt declined pharmacy assistance with helping to quit smoking . Pt would like Dr. Halford Chessman to review her request to prescribe Nicatrol inhaler. Pt was encouraged to seek Rx from PCP.  ? ?Dr. Halford Chessman please advise.   ?

## 2021-08-20 NOTE — Telephone Encounter (Signed)
Extremely hesitant to use in patient's with hx CAD, MI and angina. I would get her a visit with on of our pharmacist to discuss smoking cessation further. Other script would need to come from her primary care doctor or pulmonologist.  ?

## 2021-08-20 NOTE — Telephone Encounter (Signed)
Fax received from pt's pharmacy stating that the nicotrol inhaler is not covered by pt's insurance. ? ?Preferred alternatives include nicotrol NS, bupropion HCL SR, varenicline tartrate. ? ?Dr. Halford Chessman, please advise on this for pt. ?

## 2021-08-21 DIAGNOSIS — R102 Pelvic and perineal pain: Secondary | ICD-10-CM | POA: Diagnosis not present

## 2021-08-21 DIAGNOSIS — N898 Other specified noninflammatory disorders of vagina: Secondary | ICD-10-CM | POA: Diagnosis not present

## 2021-08-21 DIAGNOSIS — Z8619 Personal history of other infectious and parasitic diseases: Secondary | ICD-10-CM | POA: Diagnosis not present

## 2021-08-21 DIAGNOSIS — Z9189 Other specified personal risk factors, not elsewhere classified: Secondary | ICD-10-CM | POA: Diagnosis not present

## 2021-08-21 DIAGNOSIS — Z01419 Encounter for gynecological examination (general) (routine) without abnormal findings: Secondary | ICD-10-CM | POA: Diagnosis not present

## 2021-08-21 DIAGNOSIS — N950 Postmenopausal bleeding: Secondary | ICD-10-CM | POA: Diagnosis not present

## 2021-08-21 DIAGNOSIS — R300 Dysuria: Secondary | ICD-10-CM | POA: Diagnosis not present

## 2021-08-21 DIAGNOSIS — N3001 Acute cystitis with hematuria: Secondary | ICD-10-CM | POA: Diagnosis not present

## 2021-08-21 DIAGNOSIS — K5900 Constipation, unspecified: Secondary | ICD-10-CM | POA: Diagnosis not present

## 2021-08-21 NOTE — Telephone Encounter (Signed)
Please inform the patient of this and ask if she would be okay with using nicotine replacement by nasal spray.  If she is okay, then please send order.  Otherwise, she would have to purchase nicotrol inhaler on her own. ?

## 2021-08-21 NOTE — Telephone Encounter (Signed)
Lmtcb for pt.  

## 2021-08-22 ENCOUNTER — Encounter: Payer: Self-pay | Admitting: *Deleted

## 2021-08-22 DIAGNOSIS — I5032 Chronic diastolic (congestive) heart failure: Secondary | ICD-10-CM | POA: Diagnosis not present

## 2021-08-22 DIAGNOSIS — E7800 Pure hypercholesterolemia, unspecified: Secondary | ICD-10-CM | POA: Diagnosis not present

## 2021-08-22 DIAGNOSIS — J441 Chronic obstructive pulmonary disease with (acute) exacerbation: Secondary | ICD-10-CM | POA: Diagnosis not present

## 2021-08-22 NOTE — Telephone Encounter (Signed)
ATC x2.  LM to return call. ?

## 2021-08-22 NOTE — Telephone Encounter (Signed)
Unable to reach letter sent.  Encounter closed per policy. ?

## 2021-08-26 ENCOUNTER — Encounter: Payer: Self-pay | Admitting: Cardiology

## 2021-08-26 DIAGNOSIS — I5030 Unspecified diastolic (congestive) heart failure: Secondary | ICD-10-CM | POA: Insufficient documentation

## 2021-08-26 NOTE — Assessment & Plan Note (Addendum)
Significant multivessel disease with several stents in the circumflex system now occluded.  Also has RCA disease. ? ?On maintenance dose clopidogrel for combination of CAD and PAD. ?? Okay to hold Plavix 5-7 days preop for surgeries or procedures. ? ? ?She is on Repatha now finally pravastatin and Vascepa. ?No beta-blocker because of fatigue.  No ARB because of hypotension issues.  No amlodipine but also because of hypotension issues.  She is therefore only on Imdur.. ? ?No current changes.  Smoking cessation counseling is the main thing. ?Refill Imdur, sublingual nitroglycerin and PRN Lasix. ?

## 2021-08-26 NOTE — Assessment & Plan Note (Signed)
Smoking cessation instruction/counseling given:  counseled patient on the dangers of tobacco use, advised patient to stop smoking, and reviewed strategies to maximize success   ?I stressed the importance of finally stopping smoking.  She had done really well for a while now is back to smoking 1 pack a day.  I questioned her logic and whether she wants to live.  She has had multiple heart attacks and now has occlusive CAD this in conjunction with also having COPD and metastatic lung cancer requiring aggressive therapy.  This included brain mets.  Despite all this, she is still smoking. ? ?We talked for least for 5 minutes about this and she does not seem to be in the mindset to consider smoking cessation. ? ?

## 2021-08-26 NOTE — Assessment & Plan Note (Signed)
She does have some mild edema and occasional orthopnea PND, we talked about importance of maintaining a steady dry weight.  Her target home weight should be about 130-132 pounds which correlates to 134-36 pounds here. ? ?We discussed the use of sliding scale Lasix for weight gain more than 3 pounds or worsening edema. ? ?Probably related to microvascular disease more so than hypertension.  Continue Imdur but no blood pressure room to use other medications. ?

## 2021-08-26 NOTE — Assessment & Plan Note (Signed)
She has had several different heart attacks, some of the studies Owen non-STEMI's.  Essentially occluded circumflex system now.  She still has stents in the RCA.  Regardless of multiple MIs, COPD and lung cancer, she still smokes which is the major risk factor that we can control. ? ?Preserved EF on echo.  Suspect she may have some microvascular disease related angina and diastolic dysfunction but no overt CHF. ?

## 2021-08-26 NOTE — Assessment & Plan Note (Signed)
Concern was related for microvascular disease versus spasm.  She was not able to tolerate amlodipine so we switched to Imdur.  Seems to be tolerating well. ? ?Could consider Ranexa in the future if necessary. ? ? ?

## 2021-08-26 NOTE — Assessment & Plan Note (Signed)
Thankfully, pretty well controlled.  I think a lot of these were anxiety.  She has had multiple monitors that did not show anything of concern.  Being off beta-blocker is actually seem to be more helpful than being on it. ? ?Difficult situation where we are trying to diurese but also have her maintain adequate hydration of 70 to 80 ounces of water a day. ?

## 2021-08-26 NOTE — Assessment & Plan Note (Signed)
Chronic condition from extensive CAD. ?Really only able to tolerate Imdur.  We have tried several other medications and she has been intolerant due to hypotension issues. ? ?Dr. Cyndia Bent is a beta-blockers of been unsuccessful. ?

## 2021-08-26 NOTE — Assessment & Plan Note (Signed)
On combination of Vascepa, Pravachol and Repatha.  Most recent lipid panel was March 2022.  Excellent control. ? ?She is due for follow-up labs which we will order for the next week or 2.  Lipid panel, chemistry panel and CBC. ?

## 2021-08-26 NOTE — Assessment & Plan Note (Signed)
Very symptomatic orthostatic hypotension.  As such we have ruled out the use of amlodipine and ARB's.  We even stop beta-blocker. ?Now her pressures have stabilized out. ?

## 2021-08-26 NOTE — Assessment & Plan Note (Signed)
Thankfully, no further episodes that I can tell.  She had some falls, but no true syncope.  We have taken away any antihypertensive agents besides Imdur.  Stressed the importance of adequate hydration at least 70 to 80 ounces of water a day as well as eating food. ? ?She needs to take her time getting up to do things, but also needs a glass of water on the bedside to drink before getting up. ?

## 2021-08-27 ENCOUNTER — Telehealth: Payer: Self-pay | Admitting: Pulmonary Disease

## 2021-08-28 NOTE — Telephone Encounter (Signed)
[name], MD ?  ?8:47 AM ?Note ?Please inform the patient of this and ask if she would be okay with using nicotine replacement by nasal spray.  If she is okay, then please send order.  Otherwise, she would have to purchase nicotrol inhaler on her own.  ?  ? ? ?Spoke to patient and relayed above message. She stated that she has tried nasal spray previously and it did not work for her. She would like to research price of nicotrol inhaler.  ?Nothing further needed at this time.  ? ? ?

## 2021-09-11 NOTE — Telephone Encounter (Signed)
In separate encounter dated 2/15, it shows that the PA for pt's levalbuterol inhaler was approved. Attempted to call pt to see if she was able to get her inhaler but unable to reach.left message for her to return call. ?

## 2021-09-17 ENCOUNTER — Telehealth: Payer: Self-pay | Admitting: Internal Medicine

## 2021-09-18 ENCOUNTER — Other Ambulatory Visit: Payer: Self-pay | Admitting: *Deleted

## 2021-09-18 DIAGNOSIS — K219 Gastro-esophageal reflux disease without esophagitis: Secondary | ICD-10-CM | POA: Diagnosis not present

## 2021-09-18 DIAGNOSIS — J439 Emphysema, unspecified: Secondary | ICD-10-CM | POA: Diagnosis not present

## 2021-09-18 DIAGNOSIS — E7800 Pure hypercholesterolemia, unspecified: Secondary | ICD-10-CM | POA: Diagnosis not present

## 2021-09-18 DIAGNOSIS — F419 Anxiety disorder, unspecified: Secondary | ICD-10-CM | POA: Diagnosis not present

## 2021-09-18 DIAGNOSIS — R7303 Prediabetes: Secondary | ICD-10-CM | POA: Diagnosis not present

## 2021-09-18 DIAGNOSIS — C3491 Malignant neoplasm of unspecified part of right bronchus or lung: Secondary | ICD-10-CM

## 2021-09-18 DIAGNOSIS — J449 Chronic obstructive pulmonary disease, unspecified: Secondary | ICD-10-CM | POA: Diagnosis not present

## 2021-09-18 DIAGNOSIS — I2510 Atherosclerotic heart disease of native coronary artery without angina pectoris: Secondary | ICD-10-CM | POA: Diagnosis not present

## 2021-09-18 DIAGNOSIS — E785 Hyperlipidemia, unspecified: Secondary | ICD-10-CM | POA: Diagnosis not present

## 2021-09-21 ENCOUNTER — Encounter: Payer: Self-pay | Admitting: Internal Medicine

## 2021-09-24 DIAGNOSIS — N3945 Continuous leakage: Secondary | ICD-10-CM | POA: Diagnosis not present

## 2021-09-24 DIAGNOSIS — R102 Pelvic and perineal pain: Secondary | ICD-10-CM | POA: Diagnosis not present

## 2021-09-24 DIAGNOSIS — N950 Postmenopausal bleeding: Secondary | ICD-10-CM | POA: Diagnosis not present

## 2021-09-25 ENCOUNTER — Inpatient Hospital Stay: Payer: Medicare HMO | Attending: Internal Medicine

## 2021-09-25 ENCOUNTER — Other Ambulatory Visit: Payer: Self-pay

## 2021-09-25 ENCOUNTER — Ambulatory Visit (HOSPITAL_COMMUNITY)
Admission: RE | Admit: 2021-09-25 | Discharge: 2021-09-25 | Disposition: A | Discharge status: Home / Self Care | Payer: Medicare HMO | Source: Ambulatory Visit | Attending: Physician Assistant | Admitting: Physician Assistant

## 2021-09-25 DIAGNOSIS — I252 Old myocardial infarction: Secondary | ICD-10-CM | POA: Insufficient documentation

## 2021-09-25 DIAGNOSIS — R918 Other nonspecific abnormal finding of lung field: Secondary | ICD-10-CM | POA: Diagnosis not present

## 2021-09-25 DIAGNOSIS — C3490 Malignant neoplasm of unspecified part of unspecified bronchus or lung: Secondary | ICD-10-CM | POA: Diagnosis not present

## 2021-09-25 DIAGNOSIS — R296 Repeated falls: Secondary | ICD-10-CM | POA: Insufficient documentation

## 2021-09-25 DIAGNOSIS — C3491 Malignant neoplasm of unspecified part of right bronchus or lung: Secondary | ICD-10-CM

## 2021-09-25 DIAGNOSIS — Z85118 Personal history of other malignant neoplasm of bronchus and lung: Secondary | ICD-10-CM | POA: Diagnosis not present

## 2021-09-25 DIAGNOSIS — I700 Atherosclerosis of aorta: Secondary | ICD-10-CM | POA: Diagnosis not present

## 2021-09-25 DIAGNOSIS — F17210 Nicotine dependence, cigarettes, uncomplicated: Secondary | ICD-10-CM | POA: Insufficient documentation

## 2021-09-25 DIAGNOSIS — R519 Headache, unspecified: Secondary | ICD-10-CM | POA: Diagnosis not present

## 2021-09-25 DIAGNOSIS — J439 Emphysema, unspecified: Secondary | ICD-10-CM | POA: Diagnosis not present

## 2021-09-25 LAB — CBC WITH DIFFERENTIAL (CANCER CENTER ONLY)
Abs Immature Granulocytes: 0.03 10*3/uL (ref 0.00–0.07)
Basophils Absolute: 0 10*3/uL (ref 0.0–0.1)
Basophils Relative: 0 %
Eosinophils Absolute: 0.1 10*3/uL (ref 0.0–0.5)
Eosinophils Relative: 1 %
HCT: 43.8 % (ref 36.0–46.0)
Hemoglobin: 14.2 g/dL (ref 12.0–15.0)
Immature Granulocytes: 0 %
Lymphocytes Relative: 12 %
Lymphs Abs: 0.9 10*3/uL (ref 0.7–4.0)
MCH: 30.1 pg (ref 26.0–34.0)
MCHC: 32.4 g/dL (ref 30.0–36.0)
MCV: 92.8 fL (ref 80.0–100.0)
Monocytes Absolute: 0.4 10*3/uL (ref 0.1–1.0)
Monocytes Relative: 6 %
Neutro Abs: 6.2 10*3/uL (ref 1.7–7.7)
Neutrophils Relative %: 81 %
Platelet Count: 212 10*3/uL (ref 150–400)
RBC: 4.72 MIL/uL (ref 3.87–5.11)
RDW: 13.3 % (ref 11.5–15.5)
WBC Count: 7.7 10*3/uL (ref 4.0–10.5)
nRBC: 0 % (ref 0.0–0.2)

## 2021-09-25 LAB — CMP (CANCER CENTER ONLY)
ALT: 10 U/L (ref 0–44)
AST: 13 U/L — ABNORMAL LOW (ref 15–41)
Albumin: 4.1 g/dL (ref 3.5–5.0)
Alkaline Phosphatase: 80 U/L (ref 38–126)
Anion gap: 6 (ref 5–15)
BUN: 13 mg/dL (ref 8–23)
CO2: 30 mmol/L (ref 22–32)
Calcium: 9.9 mg/dL (ref 8.9–10.3)
Chloride: 105 mmol/L (ref 98–111)
Creatinine: 0.73 mg/dL (ref 0.44–1.00)
GFR, Estimated: >60 mL/min (ref >60)
Glucose, Bld: 114 mg/dL — ABNORMAL HIGH (ref 70–99)
Potassium: 4.2 mmol/L (ref 3.5–5.1)
Sodium: 141 mmol/L (ref 135–145)
Total Bilirubin: 0.4 mg/dL (ref 0.3–1.2)
Total Protein: 6.4 g/dL — ABNORMAL LOW (ref 6.5–8.1)

## 2021-09-25 MED ORDER — IOHEXOL 300 MG/ML  SOLN
75.0000 mL | Freq: Once | INTRAMUSCULAR | Status: AC | PRN
  Administered 2021-09-25: 75 mL via INTRAVENOUS

## 2021-09-25 MED ORDER — SODIUM CHLORIDE (PF) 0.9 % IJ SOLN
INTRAMUSCULAR | Status: AC
  Filled 2021-09-25: qty 50

## 2021-09-27 ENCOUNTER — Other Ambulatory Visit: Payer: Medicare HMO

## 2021-09-27 ENCOUNTER — Other Ambulatory Visit: Payer: Self-pay

## 2021-09-27 ENCOUNTER — Inpatient Hospital Stay (HOSPITAL_BASED_OUTPATIENT_CLINIC_OR_DEPARTMENT_OTHER): Payer: Medicare HMO | Admitting: Internal Medicine

## 2021-09-27 VITALS — BP 119/60 | HR 77 | Temp 97.9°F | Wt 138.6 lb

## 2021-09-27 DIAGNOSIS — F17210 Nicotine dependence, cigarettes, uncomplicated: Secondary | ICD-10-CM | POA: Diagnosis not present

## 2021-09-27 DIAGNOSIS — I700 Atherosclerosis of aorta: Secondary | ICD-10-CM | POA: Diagnosis not present

## 2021-09-27 DIAGNOSIS — C771 Secondary and unspecified malignant neoplasm of intrathoracic lymph nodes: Secondary | ICD-10-CM

## 2021-09-27 DIAGNOSIS — Z85118 Personal history of other malignant neoplasm of bronchus and lung: Secondary | ICD-10-CM | POA: Diagnosis not present

## 2021-09-27 DIAGNOSIS — R296 Repeated falls: Secondary | ICD-10-CM | POA: Diagnosis not present

## 2021-09-27 DIAGNOSIS — C3490 Malignant neoplasm of unspecified part of unspecified bronchus or lung: Secondary | ICD-10-CM | POA: Diagnosis not present

## 2021-09-27 DIAGNOSIS — I252 Old myocardial infarction: Secondary | ICD-10-CM | POA: Diagnosis not present

## 2021-09-27 DIAGNOSIS — J439 Emphysema, unspecified: Secondary | ICD-10-CM | POA: Diagnosis not present

## 2021-09-27 DIAGNOSIS — R519 Headache, unspecified: Secondary | ICD-10-CM | POA: Diagnosis not present
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
## 2021-10-04 ENCOUNTER — Telehealth: Payer: Self-pay | Admitting: Cardiology

## 2021-10-04 NOTE — Telephone Encounter (Signed)
Patient called and said that she is having problem with injecting the medication Evolocumab (REPATHA SURECLICK) 619 MG/ML SOAJ ?

## 2021-10-04 NOTE — Telephone Encounter (Signed)
Spoke with patient.  She states both injectors in the box were malfunctioning.   Advised she reach out to the manufacturer for replacement devices.  ?

## 2021-10-05 IMAGING — DX DG CHEST 2V
2 series · 2 of 2 positions shown · non-contrast
Comparison: Chest x-ray dated 03/18/2019 and chest CT dated
03/05/2019

CLINICAL DATA: Dyspnea. [id number].

EXAM:
CHEST - 2 VIEW

[chest pa]
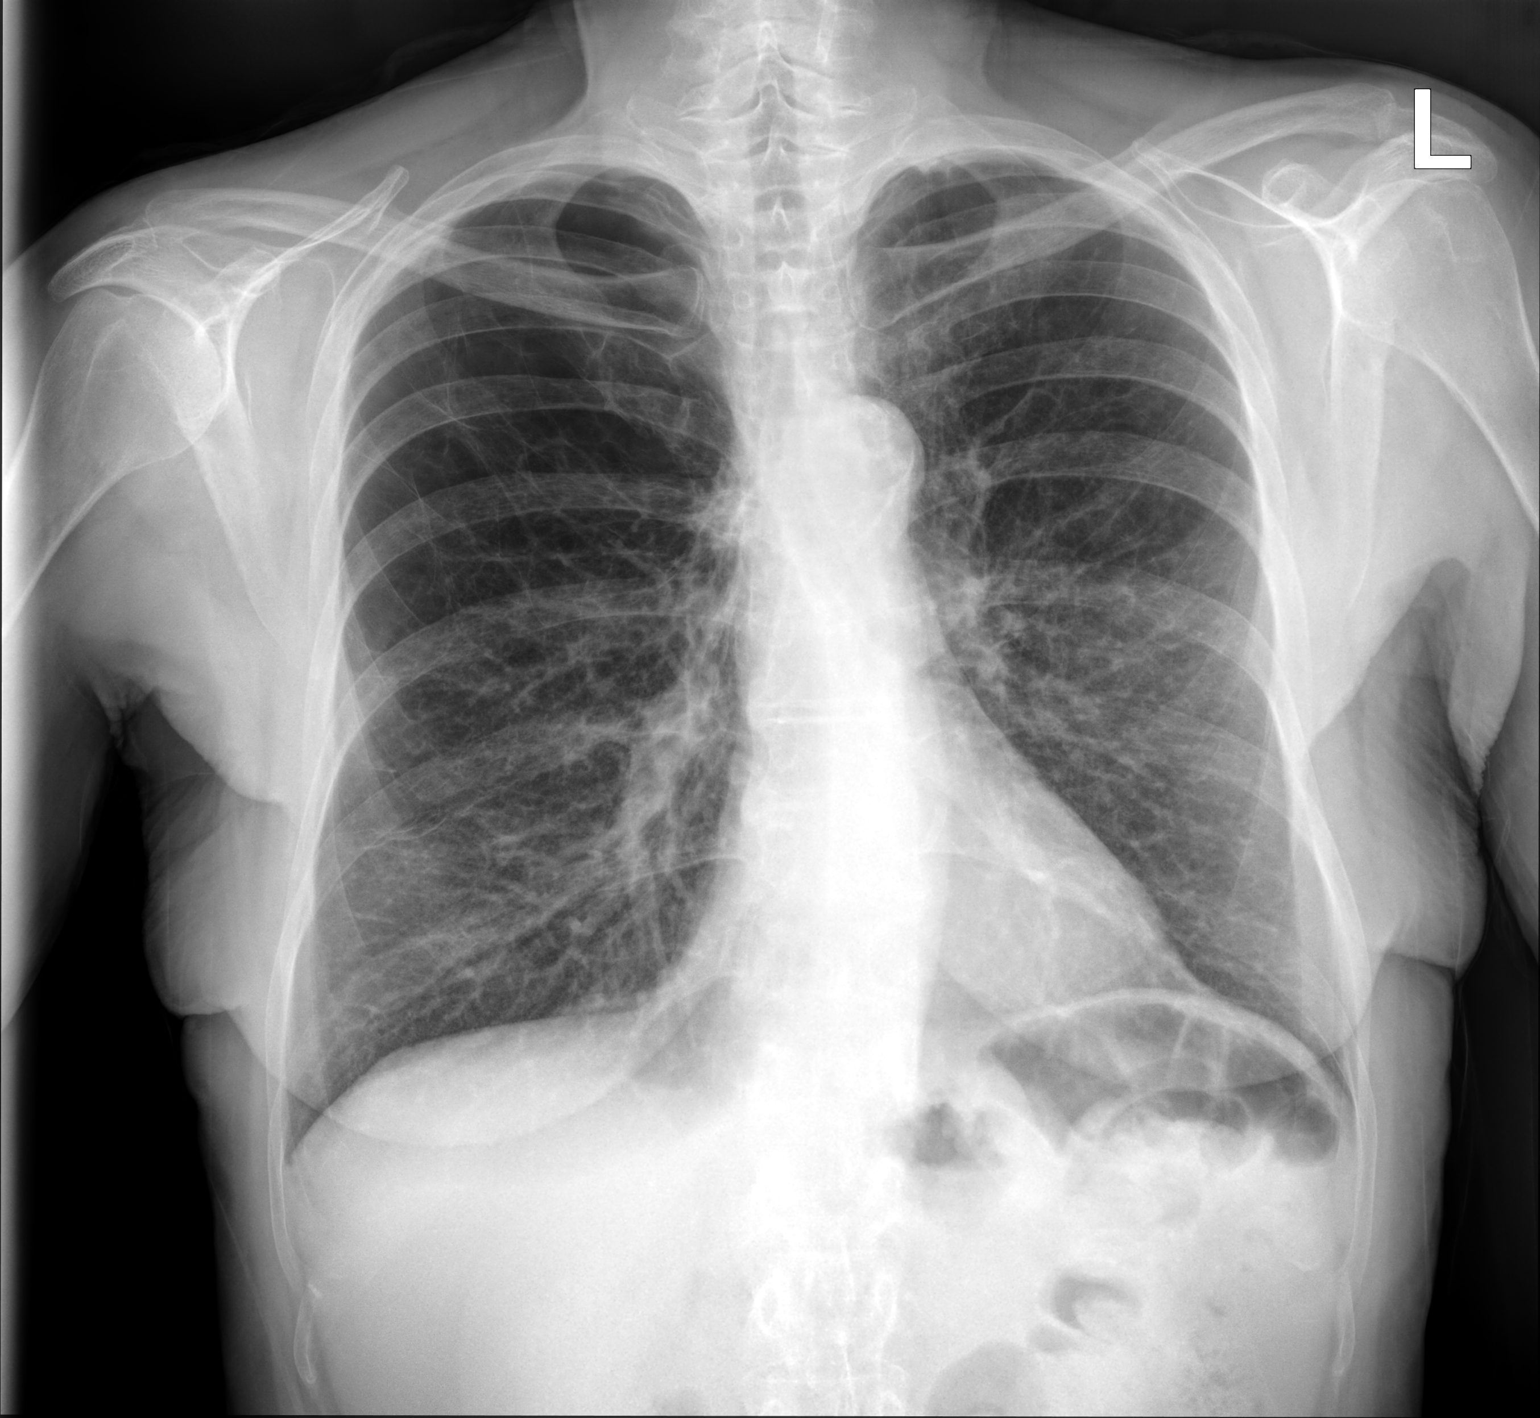

[chest lat]
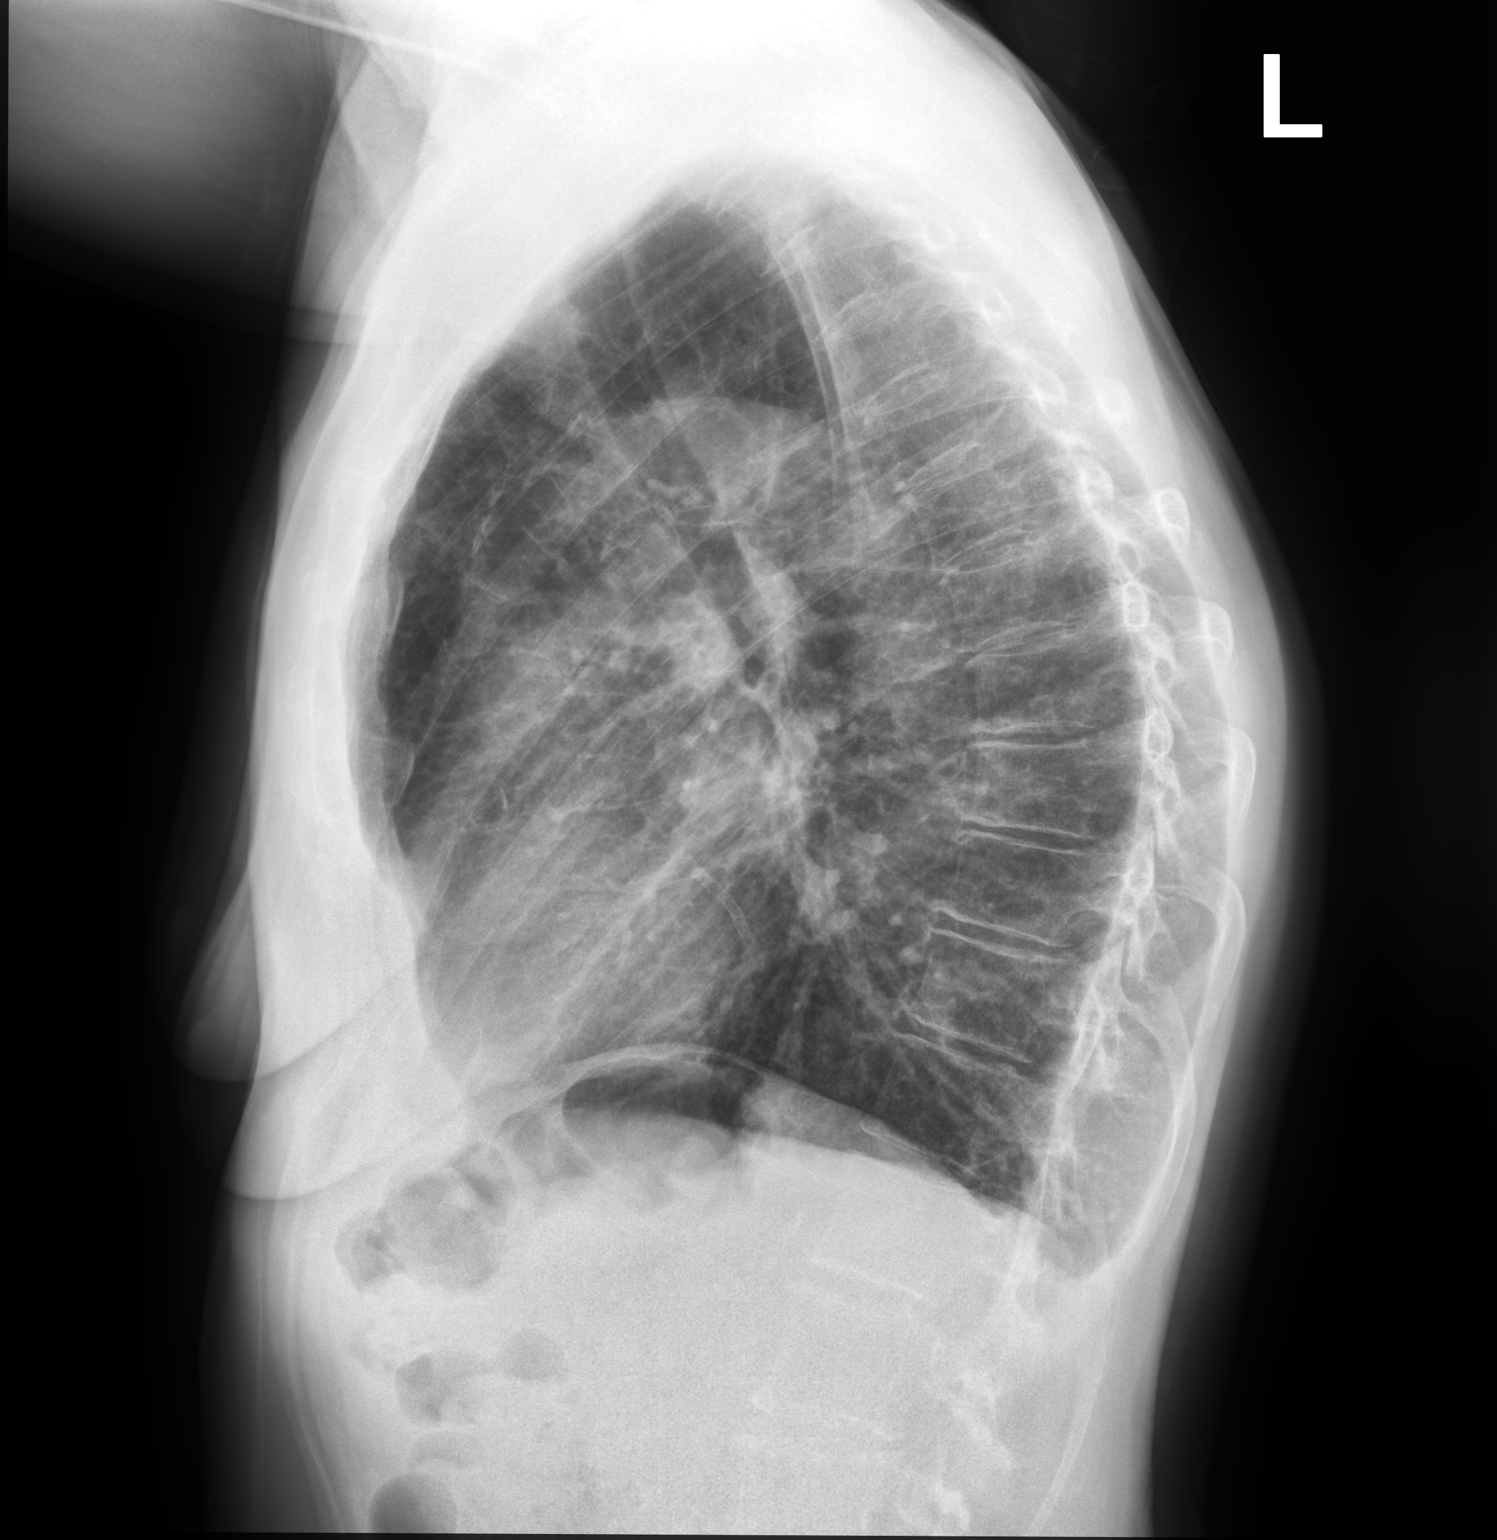

[2 of 2 positions shown; findings below may reference images not displayed]

FINDINGS: Heart size and pulmonary vascularity are normal. Aortic
atherosclerosis. Coronary artery stents in place.

No infiltrates or effusions. Hyperinflation of the lungs with
emphysematous changes in both upper lobes, more severe on the right
than the left.
IMPRESSION: 1. No acute abnormalities.
2. Aortic Atherosclerosis (WVLGU-JV4.4) and Emphysema ([id number].N).

## 2021-10-10 DIAGNOSIS — N3941 Urge incontinence: Secondary | ICD-10-CM | POA: Diagnosis not present

## 2021-10-10 DIAGNOSIS — R6889 Other general symptoms and signs: Secondary | ICD-10-CM | POA: Diagnosis not present

## 2021-10-10 DIAGNOSIS — R350 Frequency of micturition: Secondary | ICD-10-CM | POA: Diagnosis not present

## 2021-10-10 DIAGNOSIS — N393 Stress incontinence (female) (male): Secondary | ICD-10-CM | POA: Diagnosis not present

## 2021-10-22 ENCOUNTER — Other Ambulatory Visit: Payer: Self-pay | Admitting: Primary Care

## 2021-10-22 ENCOUNTER — Other Ambulatory Visit: Payer: Self-pay | Admitting: Cardiology

## 2021-10-24 ENCOUNTER — Telehealth: Payer: Self-pay | Admitting: Cardiology

## 2021-10-24 ENCOUNTER — Other Ambulatory Visit: Payer: Self-pay

## 2021-10-24 ENCOUNTER — Ambulatory Visit (HOSPITAL_COMMUNITY)
Admission: RE | Admit: 2021-10-24 | Discharge: 2021-10-24 | Disposition: A | Discharge status: Home / Self Care | Payer: Medicare HMO | Source: Ambulatory Visit | Attending: Internal Medicine | Admitting: Internal Medicine

## 2021-10-24 DIAGNOSIS — C3491 Malignant neoplasm of unspecified part of right bronchus or lung: Secondary | ICD-10-CM | POA: Insufficient documentation

## 2021-10-24 DIAGNOSIS — I6782 Cerebral ischemia: Secondary | ICD-10-CM | POA: Diagnosis not present

## 2021-10-24 DIAGNOSIS — Z85118 Personal history of other malignant neoplasm of bronchus and lung: Secondary | ICD-10-CM | POA: Diagnosis not present

## 2021-10-24 DIAGNOSIS — R6889 Other general symptoms and signs: Secondary | ICD-10-CM | POA: Diagnosis not present

## 2021-10-24 DIAGNOSIS — Z85841 Personal history of malignant neoplasm of brain: Secondary | ICD-10-CM | POA: Diagnosis not present

## 2021-10-24 MED ORDER — GADOBUTROL 1 MMOL/ML IV SOLN
6.0000 mL | Freq: Once | INTRAVENOUS | Status: AC | PRN
  Administered 2021-10-24: 6 mL via INTRAVENOUS

## 2021-10-24 MED ORDER — CLOPIDOGREL BISULFATE 75 MG PO TABS: ORAL_TABLET | ORAL | 3 refills | Status: DC

## 2021-10-24 NOTE — Telephone Encounter (Signed)
Called patient regarding request for medication refill. Advised patient refill sent to pharmacy

## 2021-10-24 NOTE — Telephone Encounter (Signed)
*  STAT* If patient is at the pharmacy, call can be transferred to refill team.   1. Which medications need to be refilled? (please list name of each medication and dose if known)  clopidogrel (PLAVIX) 75 MG tablet  2. Which pharmacy/location (including street and city if local pharmacy) is medication to be sent to? [facility] [place], [place]  3. Do they need a 30 day or 90 day supply? 90 with refills

## 2021-10-25 DIAGNOSIS — J441 Chronic obstructive pulmonary disease with (acute) exacerbation: Secondary | ICD-10-CM | POA: Diagnosis not present

## 2021-10-25 DIAGNOSIS — I5032 Chronic diastolic (congestive) heart failure: Secondary | ICD-10-CM | POA: Diagnosis not present

## 2021-10-25 DIAGNOSIS — E7800 Pure hypercholesterolemia, unspecified: Secondary | ICD-10-CM | POA: Diagnosis not present

## 2021-10-25 DIAGNOSIS — K219 Gastro-esophageal reflux disease without esophagitis: Secondary | ICD-10-CM | POA: Diagnosis not present

## 2021-10-31 ENCOUNTER — Telehealth: Payer: Self-pay | Admitting: Pulmonary Disease

## 2021-11-01 MED ORDER — ARNUITY ELLIPTA 100 MCG/ACT IN AEPB: 1.0000 | INHALATION_SPRAY | Freq: Every day | RESPIRATORY_TRACT | 0 refills | Status: DC

## 2021-11-01 NOTE — Telephone Encounter (Signed)
I called the patient and left a message per DPR that 1 refill was sent to the pharmacy and she would need to keep her follow up to get further refills. She was asked to call back with any questions. Nothing further needed.

## 2021-11-09 DIAGNOSIS — H9313 Tinnitus, bilateral: Secondary | ICD-10-CM | POA: Diagnosis not present

## 2021-11-09 DIAGNOSIS — H903 Sensorineural hearing loss, bilateral: Secondary | ICD-10-CM | POA: Diagnosis not present

## 2021-11-09 DIAGNOSIS — R6889 Other general symptoms and signs: Secondary | ICD-10-CM | POA: Diagnosis not present

## 2021-11-09 DIAGNOSIS — H6122 Impacted cerumen, left ear: Secondary | ICD-10-CM | POA: Diagnosis not present

## 2021-11-09 DIAGNOSIS — H7203 Central perforation of tympanic membrane, bilateral: Secondary | ICD-10-CM | POA: Diagnosis not present

## 2021-11-12 DIAGNOSIS — L821 Other seborrheic keratosis: Secondary | ICD-10-CM | POA: Diagnosis not present

## 2021-11-12 DIAGNOSIS — Z85828 Personal history of other malignant neoplasm of skin: Secondary | ICD-10-CM | POA: Diagnosis not present

## 2021-11-12 DIAGNOSIS — D2362 Other benign neoplasm of skin of left upper limb, including shoulder: Secondary | ICD-10-CM | POA: Diagnosis not present

## 2021-11-12 DIAGNOSIS — R6889 Other general symptoms and signs: Secondary | ICD-10-CM | POA: Diagnosis not present

## 2021-11-12 DIAGNOSIS — D2361 Other benign neoplasm of skin of right upper limb, including shoulder: Secondary | ICD-10-CM | POA: Diagnosis not present

## 2021-11-12 DIAGNOSIS — L281 Prurigo nodularis: Secondary | ICD-10-CM | POA: Diagnosis not present

## 2021-11-12 DIAGNOSIS — D2372 Other benign neoplasm of skin of left lower limb, including hip: Secondary | ICD-10-CM | POA: Diagnosis not present

## 2021-11-12 DIAGNOSIS — L738 Other specified follicular disorders: Secondary | ICD-10-CM | POA: Diagnosis not present

## 2021-11-12 DIAGNOSIS — D225 Melanocytic nevi of trunk: Secondary | ICD-10-CM | POA: Diagnosis not present

## 2021-11-12 DIAGNOSIS — D2371 Other benign neoplasm of skin of right lower limb, including hip: Secondary | ICD-10-CM | POA: Diagnosis not present

## 2021-11-20 ENCOUNTER — Ambulatory Visit (INDEPENDENT_AMBULATORY_CARE_PROVIDER_SITE_OTHER): Payer: Medicare HMO | Admitting: Pulmonary Disease

## 2021-11-20 ENCOUNTER — Encounter: Payer: Self-pay | Admitting: Pulmonary Disease

## 2021-11-20 ENCOUNTER — Other Ambulatory Visit: Payer: Self-pay | Admitting: Primary Care

## 2021-11-20 VITALS — BP 118/64 | HR 81 | Temp 98.2°F | Ht 65.0 in | Wt 134.8 lb

## 2021-11-20 DIAGNOSIS — J449 Chronic obstructive pulmonary disease, unspecified: Secondary | ICD-10-CM | POA: Diagnosis not present

## 2021-11-20 DIAGNOSIS — J479 Bronchiectasis, uncomplicated: Secondary | ICD-10-CM

## 2021-11-20 DIAGNOSIS — J439 Emphysema, unspecified: Secondary | ICD-10-CM | POA: Diagnosis not present

## 2021-11-20 DIAGNOSIS — R6889 Other general symptoms and signs: Secondary | ICD-10-CM | POA: Diagnosis not present

## 2021-11-20 NOTE — Patient Instructions (Signed)
Try using mucinex a few hours before you go to sleep  Try using nicotrol to help stop smoking cigarettes  Follow up in 6 months

## 2021-11-20 NOTE — Progress Notes (Signed)
[facility]  Chief Complaint  Patient presents with   Follow-up    Pt is here for follow up for copd. Pt states she is progessing slowing but she is not getting any better feels like. Pt states that her throat hurts all the time now due to her medications. She states she feels like her inhalers are helping her some. Coughing up thick white-yellow mucus a lot of the time now     Past Surgical History:  She  has a past surgical history that includes Leg wound repair / closure (3151); Coronary angioplasty with stent (10/10/2011); Coronary angioplasty with stent (11/06/2011); Tonsillectomy; Tubal ligation (1970's); Knee surgery; Coronary angioplasty with stent (11/19/2012); NM MYOVIEW LTD (October 2013; 12/2013); CPET (09/07/2012); left heart catheterization with coronary angiogram (N/A, 10/10/2011); percutaneous coronary stent intervention (pci-s) (N/A, 11/06/2011); left heart catheterization with coronary angiogram (N/A, 11/19/2012); left heart catheterization with coronary angiogram (N/A, 02/19/2013); left heart catheterization with coronary angiogram (N/A, 03/02/2014); Colonoscopy; Polypectomy; Video bronchoscopy with endobronchial ultrasound (N/A, 02/14/2016); Direct laryngoscopy (N/A, 02/14/2016); OTHER SURGICAL HISTORY; Breast biopsy (2000's); NM MYOVIEW LTD (02/2016); lipoma surgery (Left, 10/2016); transthoracic echocardiogram (08/30/2017); Esophagogastroduodenoscopy (egd) with propofol (N/A, 10/29/2018); EVENT MONITOR (03/2019); and transthoracic echocardiogram (02/02/2021).  Past Medical History:  Vitamin D deficiency, Seizures, RLS, Chronic back pain, HTN, Hiatal hernia, GERD, HLD, Palpitations, Depression, CAD, Cataracts, OA, Anxiety, COVID 22 May 2019  Constitutional:  BP 118/64 (BP Location: Left Arm, Patient Position: Sitting, Cuff Size: Normal)   Pulse 81   Temp 98.2 F (36.8 C) (Oral)   Ht 5\' 5"  (1.651 m)   Wt 134 lb 12.8 oz (61.1 kg)    SpO2 98%   BMI 22.43 kg/m   Brief Summary:  Michele [name] is a 66 y.o. female smoker with COPD/emphysema.  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      Subjective:   She is here with her husband.  She is still smoking about 1/2 to 1 ppd.  She is planning to start nicotrol.  She intermittently gets dizzy.  She has cough with chest congestion.  Cough keeps her up at night.  She then falls asleep when sitting during the day.  She gets winded with activity.  No hemoptysis or leg swelling.  Physical Exam:   Appearance - well kempt   ENMT - no sinus tenderness, no oral exudate, no LAN, Mallampati 2 airway, no stridor  Respiratory - decreased breath sounds bilaterally, no wheezing or rales  CV - s1s2 regular rate and rhythm, no murmurs  Ext - no clubbing, no edema  Skin - no rashes  Psych - normal mood and affect   Pulmonary testing:  PFT 12/23/11 >> FEV1 2.13 (85%), FEV1% 59, TLC 5.98 (112%), DLCO 60% Spirometry 11/19/17 >> FEV1 2.58 (100%), FEV1% 76 FeNO 11/19/17 >> 5  Chest Imaging:  CT chest 08/24/16 >> CAD, mod/severe emphysema CT chest 08/29/17 >> severe emphysema CT chest 03/02/18 >> severe centrilobular and paraseptal emphysema, 4 mm nodule RUL partially calcified CT angio chest 04/27/18 >> atherosclerosis, small/mod hiatal hernia, dilation of esophagus with mucosal thickening, severe emphysema with large bulla, bronchiectasis CT chest 09/02/19 >> severe upper lobe predominant emphysema CT chest 09/25/21 >> atherosclerosis, small HH, progressive emphysema, XRT changes to Rt hilum  Cardiac Tests:  Echo 02/02/21 >> EF 65 to 70%, grade 1 DD, mild mR  Social History:  She  reports that she has been smoking cigarettes. She has a 80.00 pack-year  smoking history. She has never used smokeless tobacco. She reports that she does not drink alcohol and does not use drugs.  Family History:  Her family history includes Asthma in her mother; Cancer in her  maternal grandmother; Colon polyps in her mother; Diabetes in her mother; Emphysema in her mother; Heart attack in her maternal grandfather; Heart disease in her father and mother; Hyperlipidemia in her mother; Hypertension in her mother; Kidney cancer in her brother; Stomach cancer in her brother and brother; Stroke in her mother; Stroke (age of onset: 41) in her brother; Thyroid cancer in her daughter.     Assessment/Plan:   COPD with chronic bronchitis, emphysema, and bronchiectasis. - she is intolerant of LABA's and albuterol due to palpitations - continue spiriva, arnuity - prn xopenex - advised her to try using mucinex in the evening time  Tobacco abuse. - explained that her respiratory symptoms will persist/progress so long as she continues to smoke - she plans to try nicotrol  Limited stage SCLC. - s/p cisplatin/etoposide with concurrent XRT and cranial XRT - observation since 2017 - followed by Dr. Curt Bears with oncology - follow up CT chest planned for April 2023  CAD s/p stents. - followed by Dr. Glenetta Hew with cardiology  Time Spent Involved in Patient Care on Day of Examination:  38 minutes  Follow up:   Patient Instructions  Try using mucinex a few hours before you go to sleep  Try using nicotrol to help stop smoking cigarettes  Follow up in 6 months  Medication List:   Allergies as of 11/20/2021       Reactions   Ciprofloxacin Other (See Comments)   Hypoglycemia    Aspirin Other (See Comments)   GI upset; patient is only able to take enteric coated Aspirin.     Crestor [rosuvastatin] Other (See Comments)   Muscle pain    Ibuprofen Other (See Comments)   GI upset    Wellbutrin [bupropion] Palpitations   Amitiza [lubiprostone] Other (See Comments)   Abdominal pain   Penicillin G    Prednisone Other (See Comments)   Heart palpations   Zoloft [sertraline Hcl] Other (See Comments)   Insomnia, fatigue    Albuterol Palpitations   Lipitor  [atorvastatin] Other (See Comments)   Muscle pain    Sulfonamide Derivatives Itching, Rash        Medication List        Accurate as of November 20, 2021  5:10 PM. If you have any questions, ask your nurse or doctor.          acetaminophen 325 MG tablet Commonly known as: TYLENOL Take 2 tablets (650 mg total) by mouth every 6 (six) hours as needed for mild pain, fever or headache.   ALPRAZolam 1 MG tablet Commonly known as: XANAX Take 1 mg by mouth as needed.   Arnuity Ellipta 100 MCG/ACT Aepb Generic drug: Fluticasone Furoate Take 1 puff by mouth daily.   Centrum Silver Ultra Womens Tabs See admin instructions.   CITRACAL + D PO Take 1 tablet by mouth in the morning and at bedtime.   clopidogrel 75 MG tablet Commonly known as: PLAVIX TAKE 1 TABLET BY MOUTH EVERY MONDAY, WEDNESDAY AND FRIDAY   co-enzyme Q-10 30 MG capsule Take 30 mg by mouth daily.   fluticasone 50 MCG/ACT nasal spray Commonly known as: FLONASE INSTILL 1 SPRAY INTO BOTH NOSTRILS DAILY   Flutter Devi 1 application by Does not apply route 2 (two) times daily.  furosemide 20 MG tablet Commonly known as: LASIX TAKE 1 TABLET(20 MG) BY MOUTH DAILY   gabapentin 100 MG capsule Commonly known as: NEURONTIN Take 100 mg by mouth daily as needed (Leg pain).   guaiFENesin 600 MG 12 hr tablet Commonly known as: Mucinex Take 1 tablet (600 mg total) by mouth 2 (two) times daily.   isosorbide mononitrate 30 MG 24 hr tablet Commonly known as: IMDUR TAKE 1 TABLET(30 MG) BY MOUTH AT BEDTIME   levalbuterol 0.63 MG/3ML nebulizer solution Commonly known as: Xopenex Take 3 mLs (0.63 mg total) by nebulization every 4 (four) hours as needed for wheezing or shortness of breath (dx: R00.2, J44.9).   levalbuterol 45 MCG/ACT inhaler Commonly known as: XOPENEX HFA INHALE 2 PUFFS BY MOUTH EVERY 6 HOURS AS NEEDED What changed: additional instructions Changed by: Martyn Ehrich, NP   nicotine 10 MG  inhaler Commonly known as: Nicotrol Inhale 1 Cartridge (1 continuous puffing total) into the lungs as needed for smoking cessation.   nitroGLYCERIN 0.4 MG SL tablet Commonly known as: Nitrostat Place 1 tablet (0.4 mg total) under the tongue every 5 (five) minutes as needed for chest pain.   pantoprazole 40 MG tablet Commonly known as: PROTONIX Take 40 mg by mouth every other day.   polyethylene glycol 17 g packet Commonly known as: MIRALAX / GLYCOLAX Take 17 g by mouth daily as needed for mild constipation.   pravastatin 80 MG tablet Commonly known as: PRAVACHOL Take 1 tablet by mouth every Monday, Wednesday, Friday   Repatha SureClick 295 MG/ML Soaj Generic drug: Evolocumab Inject 140 mg into the skin every 14 (fourteen) days.   Restasis 0.05 % ophthalmic emulsion Generic drug: cycloSPORINE Place 1 drop into both eyes 2 (two) times daily.   sodium chloride HYPERTONIC 3 % nebulizer solution Take by nebulization as needed for cough.   Spiriva HandiHaler 18 MCG inhalation capsule Generic drug: tiotropium PLACE 1 CAPSULE INTO INHALER AND INHALE DAILY   Vascepa 1 g capsule Generic drug: icosapent Ethyl TAKE 1 CAPSULE BY MOUTH TWICE DAILY        Signature:  [name], MD [place] Pager - [phone] 11/20/2021, 5:10 PM

## 2021-11-21 ENCOUNTER — Other Ambulatory Visit: Payer: Self-pay | Admitting: Cardiology

## 2021-11-27 DIAGNOSIS — H524 Presbyopia: Secondary | ICD-10-CM | POA: Diagnosis not present

## 2021-11-27 DIAGNOSIS — H11441 Conjunctival cysts, right eye: Secondary | ICD-10-CM | POA: Diagnosis not present

## 2021-11-27 DIAGNOSIS — H47321 Drusen of optic disc, right eye: Secondary | ICD-10-CM | POA: Diagnosis not present

## 2021-11-27 DIAGNOSIS — H25813 Combined forms of age-related cataract, bilateral: Secondary | ICD-10-CM | POA: Diagnosis not present

## 2021-11-27 DIAGNOSIS — H04123 Dry eye syndrome of bilateral lacrimal glands: Secondary | ICD-10-CM | POA: Diagnosis not present

## 2021-11-27 DIAGNOSIS — H16223 Keratoconjunctivitis sicca, not specified as Sjogren's, bilateral: Secondary | ICD-10-CM | POA: Diagnosis not present

## 2021-11-27 DIAGNOSIS — R7309 Other abnormal glucose: Secondary | ICD-10-CM | POA: Diagnosis not present

## 2021-11-27 DIAGNOSIS — H43393 Other vitreous opacities, bilateral: Secondary | ICD-10-CM | POA: Diagnosis not present

## 2021-11-28 ENCOUNTER — Telehealth: Payer: Self-pay | Admitting: Cardiology

## 2021-11-28 NOTE — Telephone Encounter (Signed)
Pt is calling requesting call back to see if and when she received a RX for a walker from Dr. [name]. She states that it has been damaged and they want to replace it, but would like to verify where it came from and when before they go. Please advise.

## 2021-11-30 ENCOUNTER — Telehealth: Payer: Self-pay | Admitting: Cardiology

## 2021-11-30 NOTE — Telephone Encounter (Signed)
Patient was returning phone call back to [employer]

## 2021-11-30 NOTE — Telephone Encounter (Signed)
Left message on voicemail - to contact primary doctor or  oncologist for an order for walker. Any question may call back

## 2021-11-30 NOTE — Telephone Encounter (Signed)
Spoke to patient . Informed patient she would need to contact -- primary or oncologist office-  Patient also wanted to schedule the upcoming Aug 2023 appt.  Appointment schedule - patient wrote date and time down. Verbalized understanding

## 2021-12-03 ENCOUNTER — Telehealth: Payer: Self-pay | Admitting: Pulmonary Disease

## 2021-12-03 MED ORDER — ARNUITY ELLIPTA 100 MCG/ACT IN AEPB
1.0000 | INHALATION_SPRAY | Freq: Every day | RESPIRATORY_TRACT | 0 refills | Status: DC
Start: 2021-12-03 — End: 2022-01-14

## 2021-12-03 NOTE — Telephone Encounter (Signed)
Called and spoke with patient. Advised patient that I would go ahead and send the refill in to her pharmacy. Patient verified pharmacy. Refill has been sent.   Nothing further needed.

## 2021-12-26 DIAGNOSIS — H7293 Unspecified perforation of tympanic membrane, bilateral: Secondary | ICD-10-CM | POA: Diagnosis not present

## 2021-12-26 DIAGNOSIS — K219 Gastro-esophageal reflux disease without esophagitis: Secondary | ICD-10-CM | POA: Diagnosis not present

## 2021-12-26 DIAGNOSIS — R6889 Other general symptoms and signs: Secondary | ICD-10-CM | POA: Diagnosis not present

## 2021-12-30 IMAGING — CT CT ABD-PELV W/ CM
2 of 5 series · 16 of 46 positions shown, 18 images · IV contrast (APPLIED)
Comparison: 01/13/2019

CLINICAL DATA: Epigastric pain, hemoptysis

EXAM:
CT ABDOMEN AND PELVIS WITH CONTRAST
TECHNIQUE: Multidetector CT imaging of the abdomen and pelvis was performed
using the standard protocol following bolus administration of
intravenous contrast.
CONTRAST:  100mL OMNIPAQUE IOHEXOL 350 MG/ML SOLN

[Series 5: axial st · axial · 0.70mm/px · z∈[-680,-310]mm · 13 of 84 slices shown, 15 images]
[im 5/84  soft-tissue]
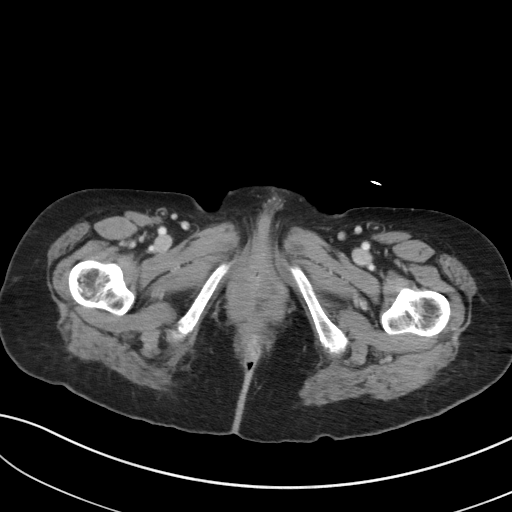
[im 5/84  bone]
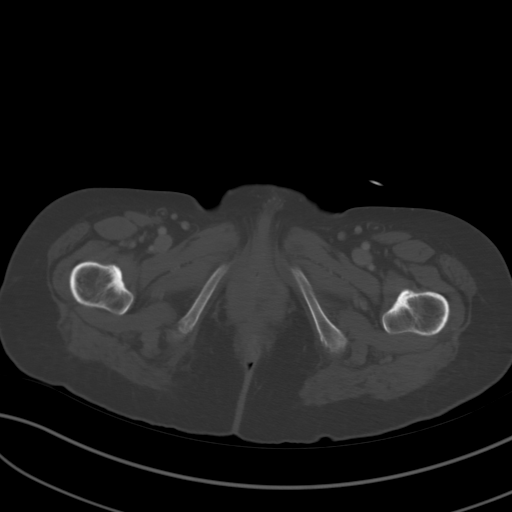
[im 10/84  soft-tissue]
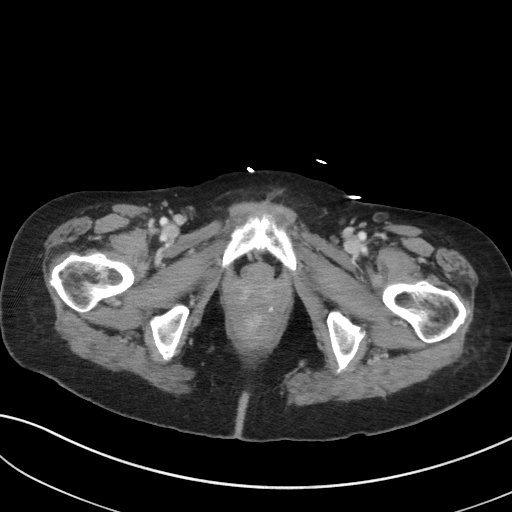
[im 19/84  soft-tissue]
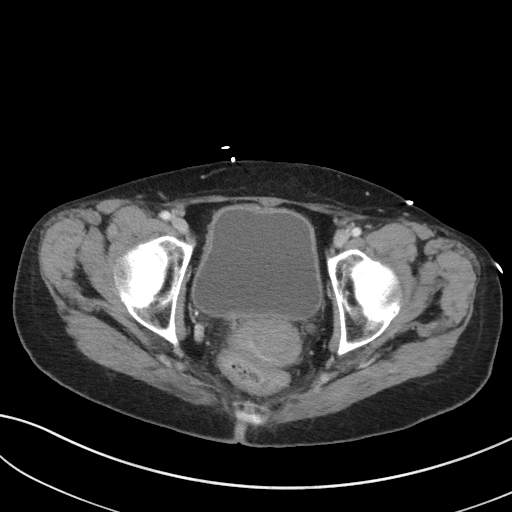
[im 24/84  soft-tissue]
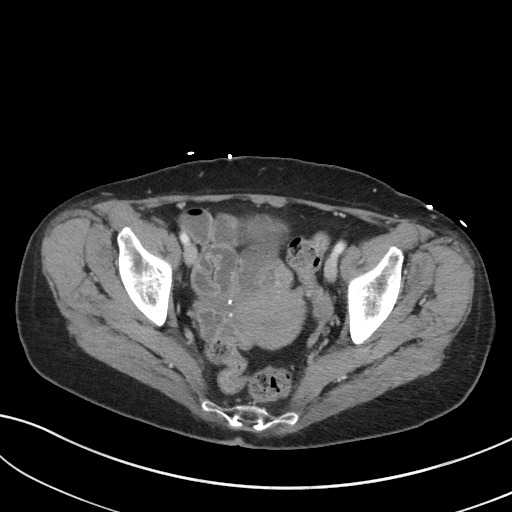
[im 28/84  soft-tissue]
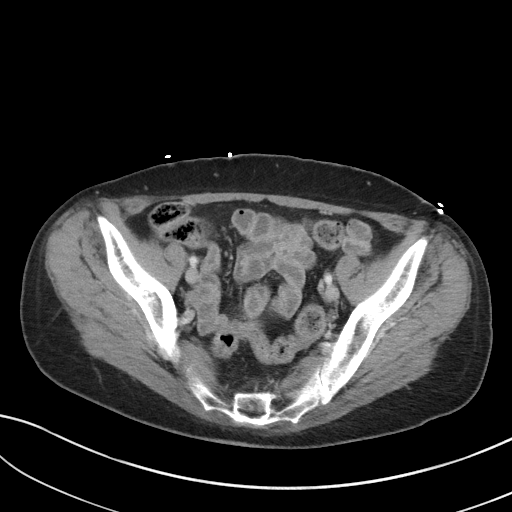
[im 37/84  soft-tissue]
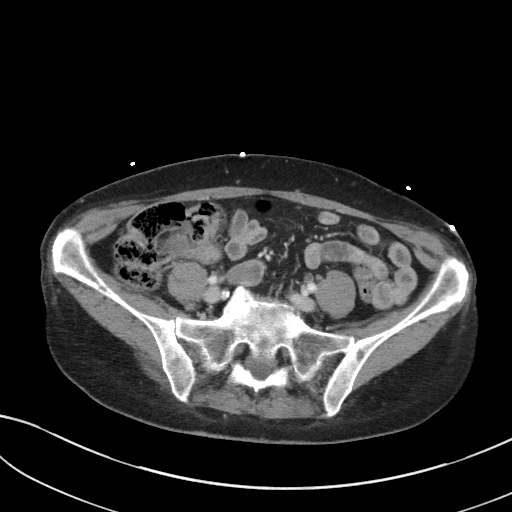
[im 42/84  soft-tissue]
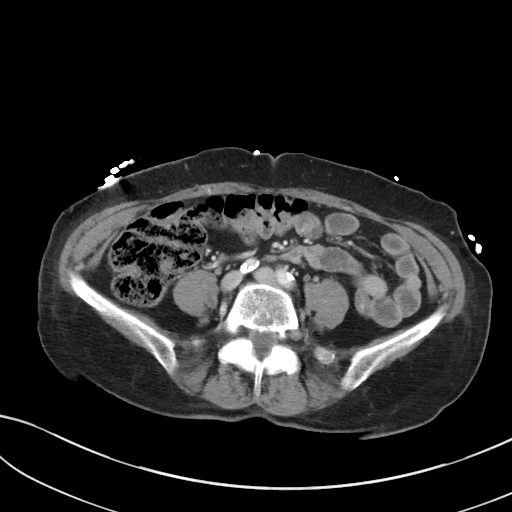
[im 47/84  soft-tissue]
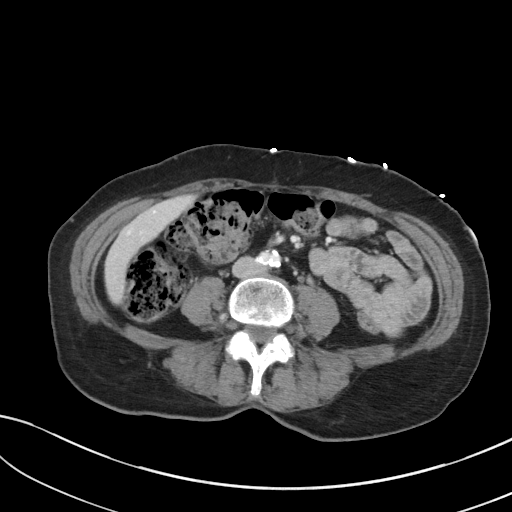
[im 56/84  soft-tissue]
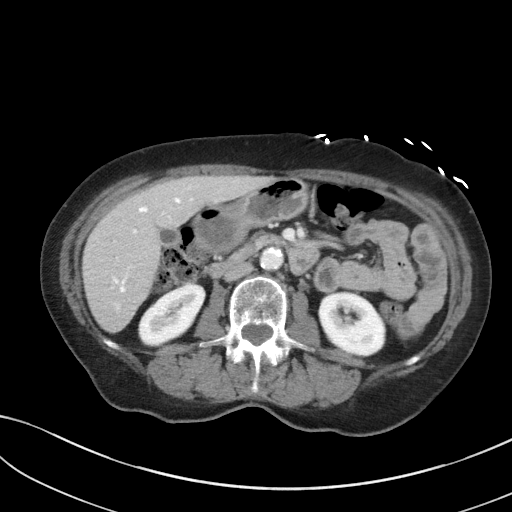
[im 56/84  bone]
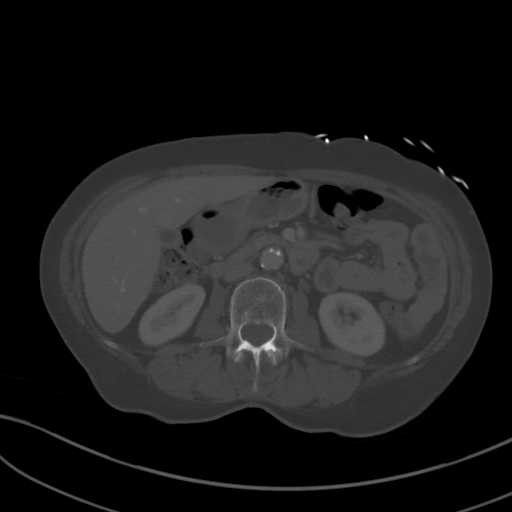
[im 60/84  soft-tissue]
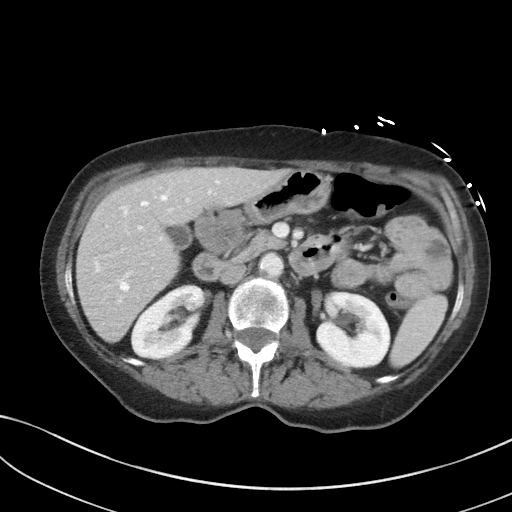
[im 65/84  soft-tissue]
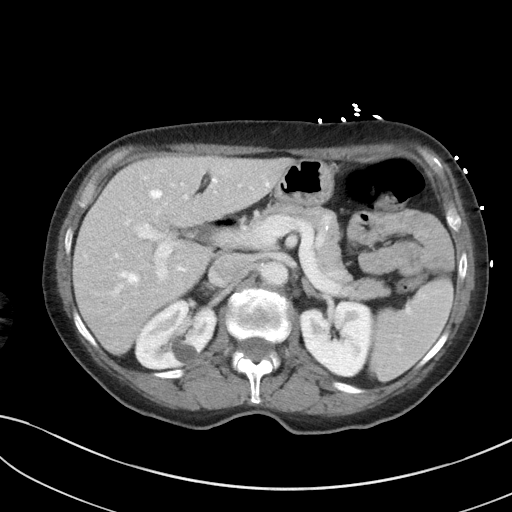
[im 74/84  soft-tissue]
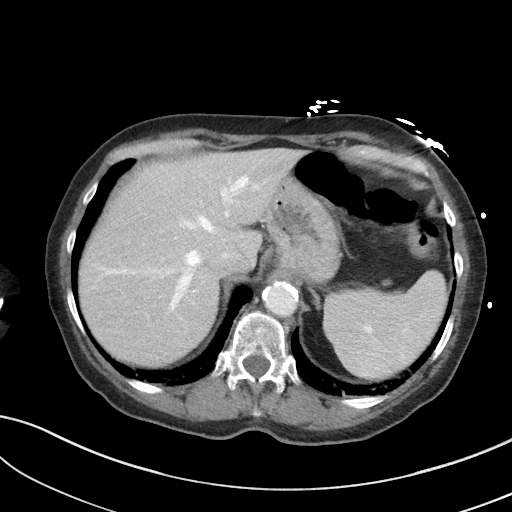
[im 79/84  soft-tissue]
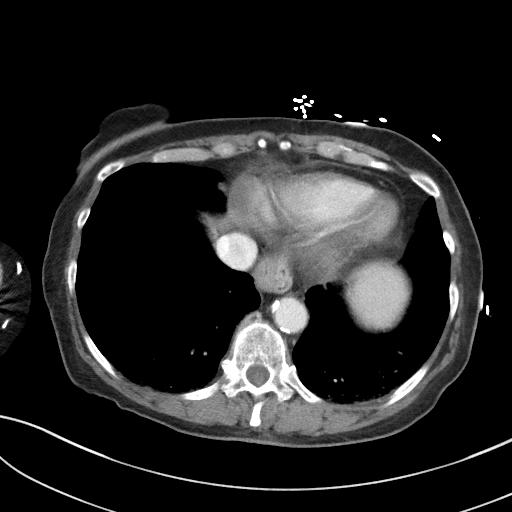

[Series 7: coronal st · coronal · 0.64mm/px · 3 of 82 slices shown]
[im 28/82  soft-tissue]
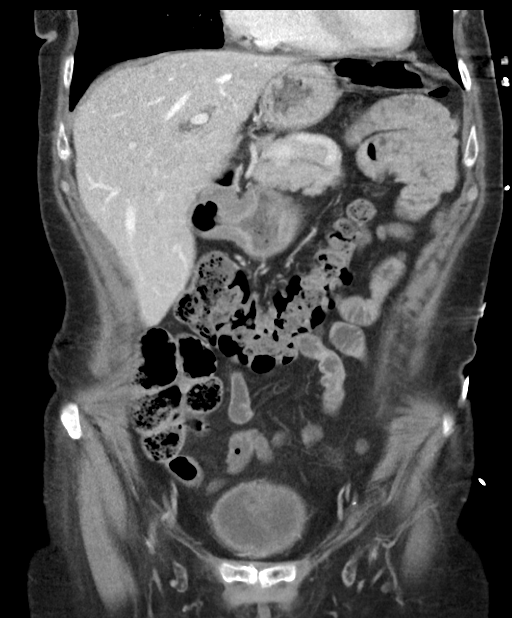
[im 37/82  soft-tissue]
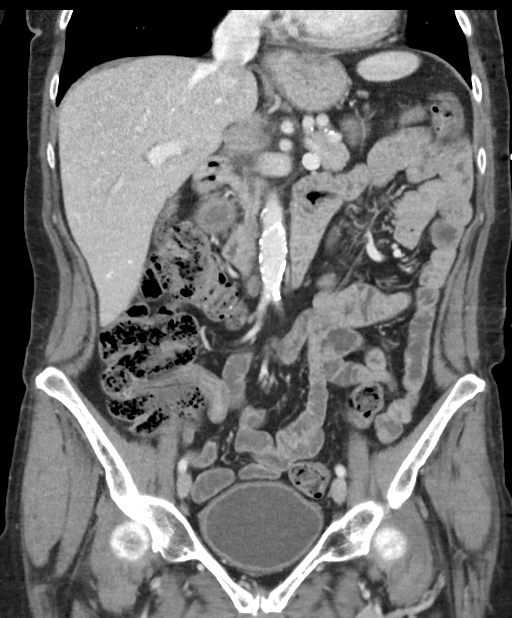
[im 46/82  soft-tissue]
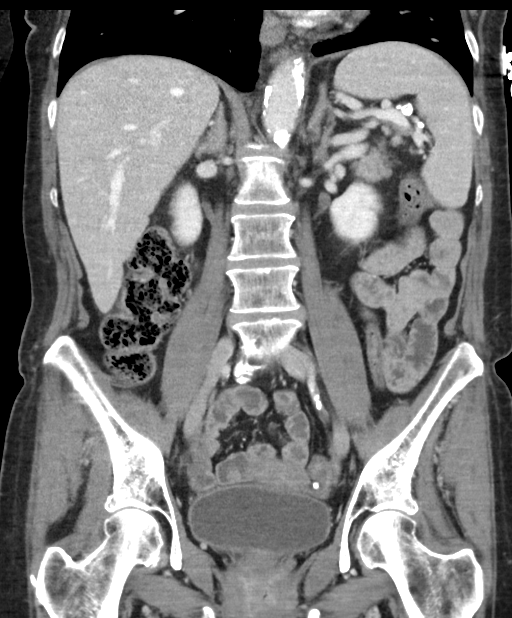

[16 of 46 positions shown; findings below may reference images not displayed]

FINDINGS: Lower chest: No acute pleural or parenchymal lung disease. Small
hiatal hernia.

Hepatobiliary: No focal liver abnormality is seen. No gallstones,
gallbladder wall thickening, or biliary dilatation.

Pancreas: Unremarkable. No pancreatic ductal dilatation or
surrounding inflammatory changes.

Spleen: Normal in size without focal abnormality.

Adrenals/Urinary Tract: Stable right renal cyst. Otherwise the
kidneys enhance normally. No urinary tract calculi or obstructive
uropathy. Normal adrenals.

Stomach/Bowel: No bowel obstruction or ileus. Normal appendix right
lower quadrant.

Vascular/Lymphatic: Aortic atherosclerosis. No enlarged abdominal or
pelvic lymph nodes.

Reproductive: Uterus and bilateral adnexa are unremarkable.

Other: No abdominal wall hernia or abnormality. No abdominopelvic
ascites.

Musculoskeletal: No acute or destructive bony lesions. Reconstructed
images demonstrate no additional findings.
IMPRESSION: 1. No acute intra-abdominal or intrapelvic process.
2. Atherosclerosis.

## 2021-12-30 IMAGING — CT CT ANGIO CHEST
2 of 7 series · 18 of 46 positions shown · IV contrast (APPLIED)
Comparison: 03/05/2019

CLINICAL DATA: Hemoptysis since this morning, productive cough

EXAM:
CT ANGIOGRAPHY CHEST WITH CONTRAST
TECHNIQUE: Multidetector CT imaging of the chest was performed using the
standard protocol during bolus administration of intravenous
contrast. Multiplanar CT image reconstructions and MIPs were
obtained to evaluate the vascular anatomy.
CONTRAST:  100mL OMNIPAQUE IOHEXOL 350 MG/ML SOLN

[Series 3: thins · axial · 0.66mm/px · z∈[-381,-96]mm · 15 of 325 slices shown]
[im 20/325  lung]
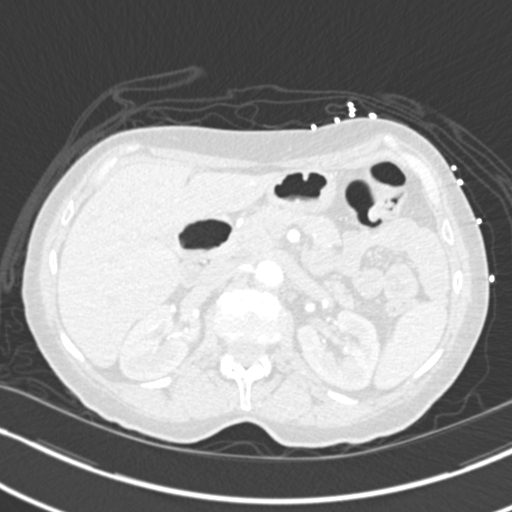
[im 39/325  soft-tissue]
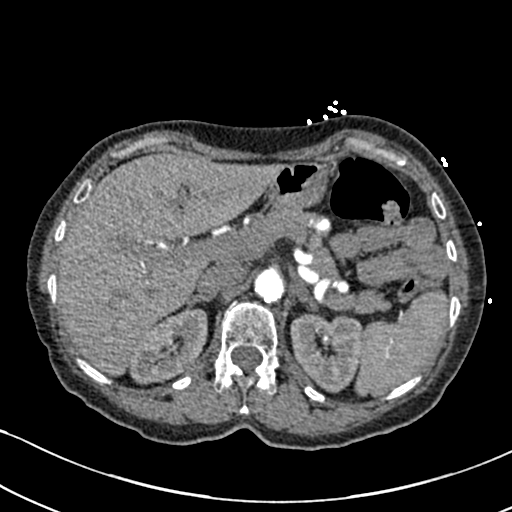
[im 58/325  lung]
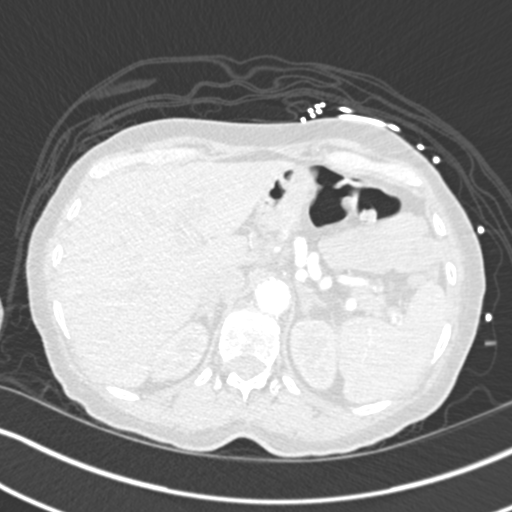
[im 77/325  soft-tissue]
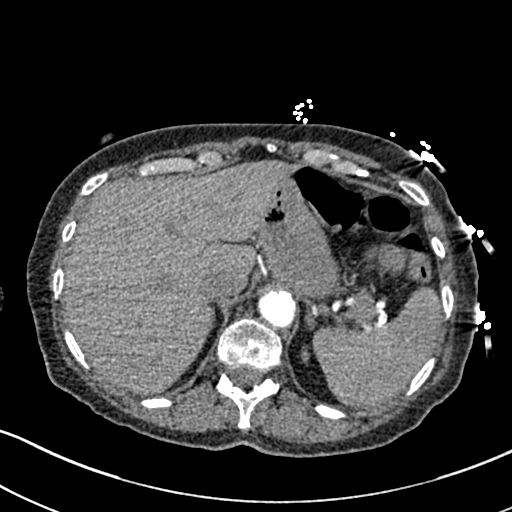
[im 96/325  lung]
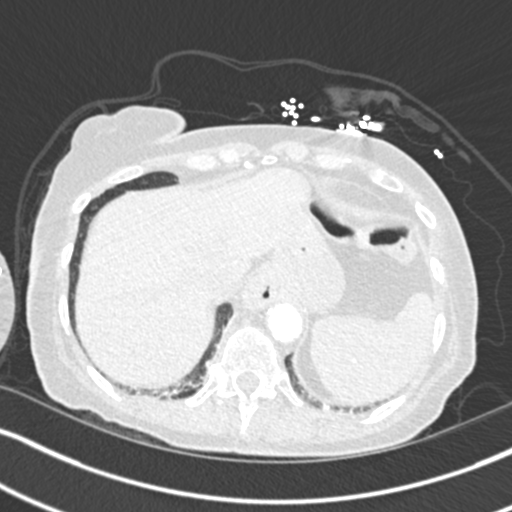
[im 115/325  soft-tissue]
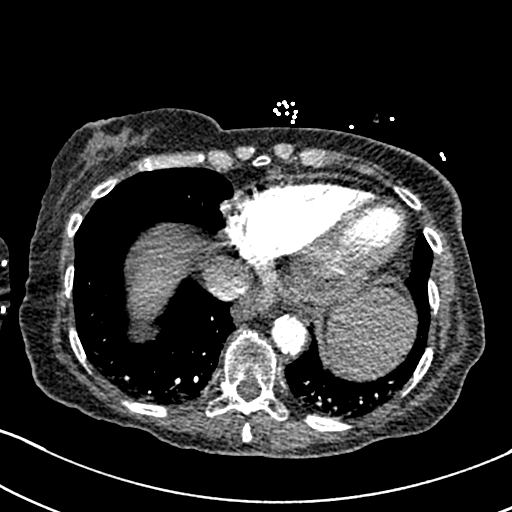
[im 134/325  lung]
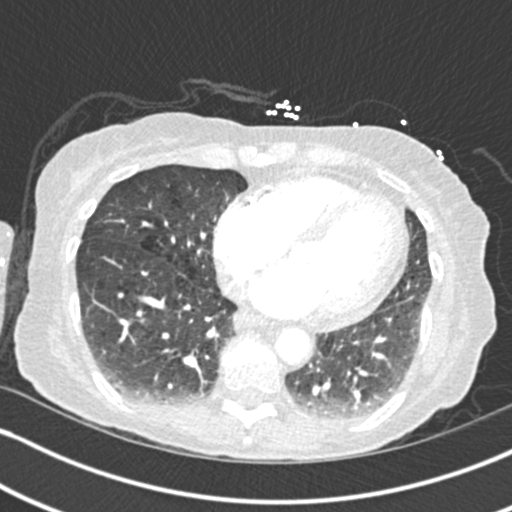
[im 172/325  soft-tissue]
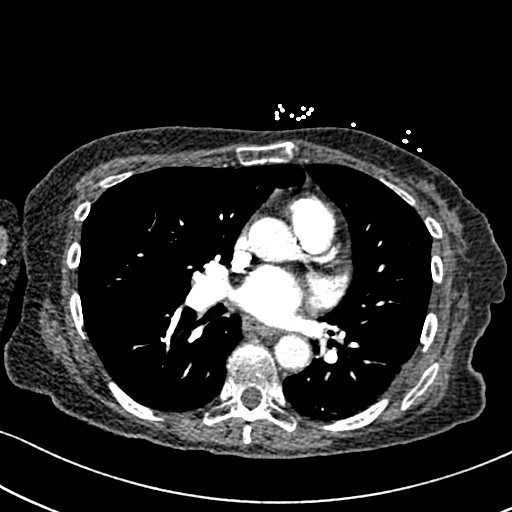
[im 191/325  lung]
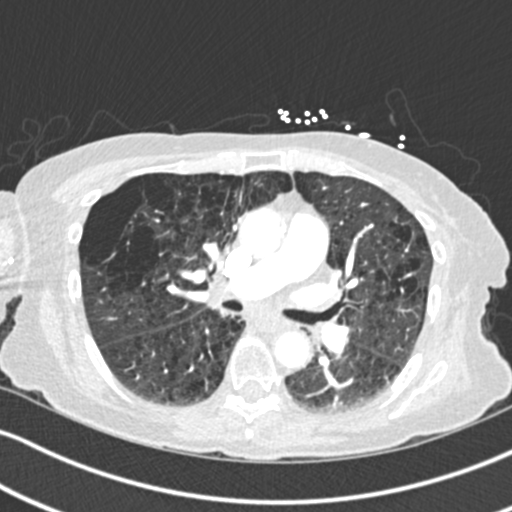
[im 210/325  soft-tissue]
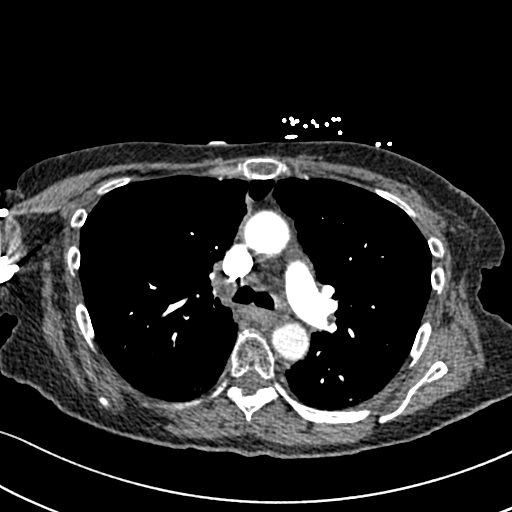
[im 229/325  lung]
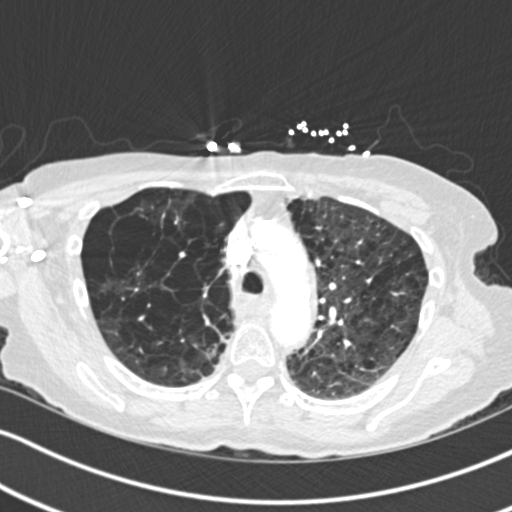
[im 248/325  soft-tissue]
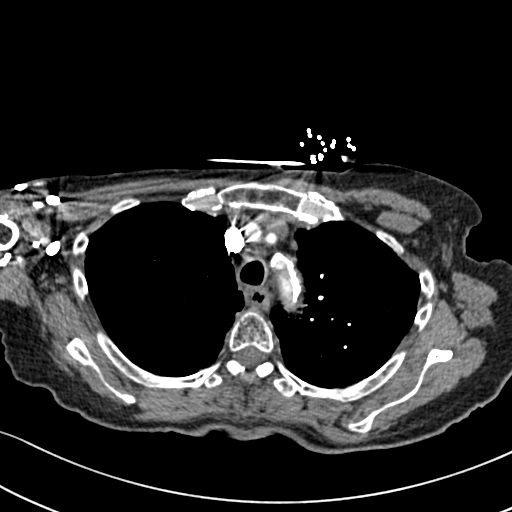
[im 267/325  lung]
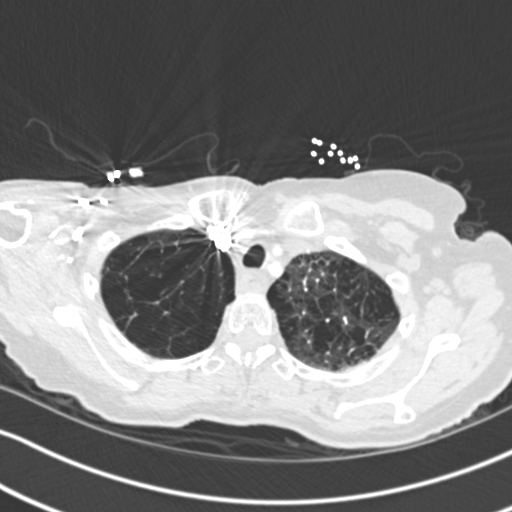
[im 286/325  soft-tissue]
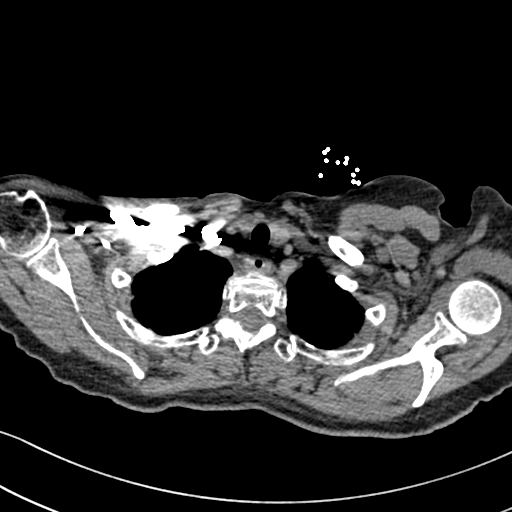
[im 305/325  lung]
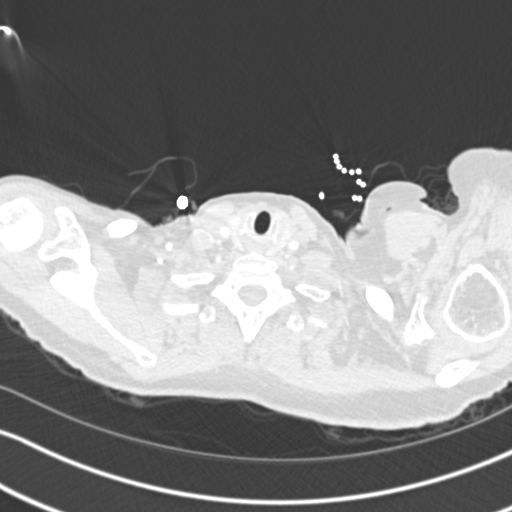

[Series 5: coronal mpr · coronal · 0.59mm/px · 3 of 123 slices shown]
[im 31/123  soft-tissue]
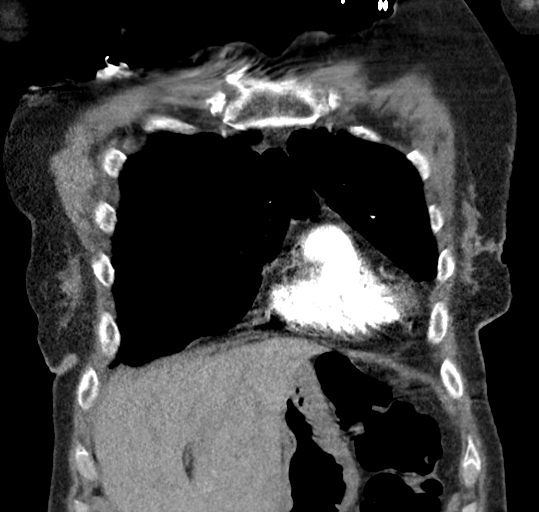
[im 62/123  soft-tissue]
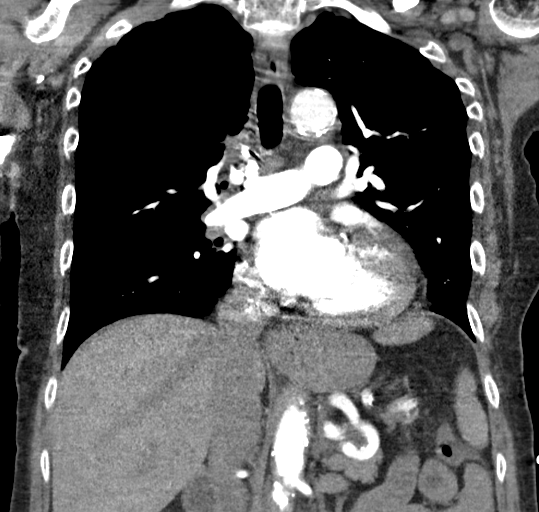
[im 92/123  soft-tissue]
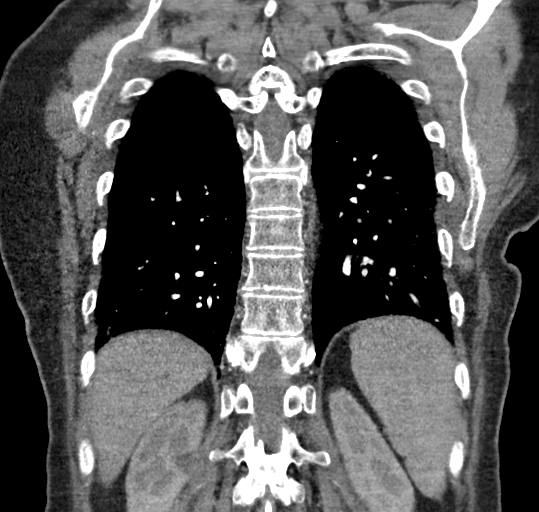

[18 of 46 positions shown; findings below may reference images not displayed]

FINDINGS: Cardiovascular: This is a technically adequate evaluation of the
pulmonary vasculature. No filling defects or pulmonary emboli.

The heart is unremarkable, with trace pericardial fluid unchanged.
Atherosclerosis of the aortic arch and coronary vasculature. No
thoracic aortic aneurysm or dissection.

Mediastinum/Nodes: There is increased soft tissue in the right
suprahilar region, between the right upper lobe bronchus and the
SVC. This area measures up to 1.2 cm in thickness. This demonstrates
significant change since prior studies from 1509 and 6052. PET-CT is
recommended in a patient with a history of previous small cell lung
cancer.

Thyroid gland, trachea, and esophagus demonstrate no significant
findings. Small hiatal hernia.

Lungs/Pleura: Severe upper lobe predominant emphysema. No acute
airspace disease, effusion, or pneumothorax. Central airways are
patent.

Upper Abdomen: No acute abnormality.

Musculoskeletal: No acute or destructive bony lesions. Reconstructed
images demonstrate no additional findings.

Review of the MIP images confirms the above findings.
IMPRESSION: 1. No evidence of pulmonary embolus.
2. Increased soft tissue density in the right suprahilar region,
nonspecific. I cannot exclude adenopathy or mass in this patient
with a history of small cell lung cancer. PET-CT recommended for
further evaluation.
3. Severe emphysema.  No superimposed airspace disease.
4. Small hiatal hernia.
5.  Aortic Atherosclerosis ([id number].I).

## 2022-01-02 ENCOUNTER — Other Ambulatory Visit: Payer: Self-pay | Admitting: Primary Care

## 2022-01-12 ENCOUNTER — Other Ambulatory Visit: Payer: Self-pay | Admitting: Cardiology

## 2022-01-14 ENCOUNTER — Other Ambulatory Visit: Payer: Self-pay | Admitting: *Deleted

## 2022-01-14 MED ORDER — ARNUITY ELLIPTA 100 MCG/ACT IN AEPB
1.0000 | INHALATION_SPRAY | Freq: Every day | RESPIRATORY_TRACT | 5 refills | Status: DC
Start: 2022-01-14 — End: 2022-11-15

## 2022-01-18 ENCOUNTER — Telehealth: Payer: Self-pay | Admitting: *Deleted

## 2022-01-18 NOTE — Telephone Encounter (Signed)
Patient called for Dr. Mickeal Skinner, LVM: Michele [name] she'd had a bran scan recently and at her last appt she did not feel well, so does not remember all that she was told. She would like to talk to Dr. Mickeal Skinner about what was on the scan.  Called patient back. Advised that patient asleep by person answering phone and they asked to take message. Informed them it was MD office returning her call and that MD will receive message.   Call information routed to MD and RN

## 2022-01-22 ENCOUNTER — Telehealth: Payer: Self-pay | Admitting: Internal Medicine

## 2022-01-22 DIAGNOSIS — R6889 Other general symptoms and signs: Secondary | ICD-10-CM | POA: Diagnosis not present

## 2022-01-22 NOTE — Telephone Encounter (Signed)
Scheduled per 8/22 in basket, message has been left
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
## 2022-01-25 ENCOUNTER — Encounter: Payer: Self-pay | Admitting: Adult Health

## 2022-01-25 ENCOUNTER — Ambulatory Visit (INDEPENDENT_AMBULATORY_CARE_PROVIDER_SITE_OTHER): Payer: Medicare HMO | Admitting: Adult Health

## 2022-01-25 VITALS — BP 118/62 | HR 86 | Ht 65.5 in | Wt 134.6 lb

## 2022-01-25 DIAGNOSIS — I2510 Atherosclerotic heart disease of native coronary artery without angina pectoris: Secondary | ICD-10-CM | POA: Diagnosis not present

## 2022-01-25 DIAGNOSIS — J439 Emphysema, unspecified: Secondary | ICD-10-CM | POA: Diagnosis not present

## 2022-01-25 DIAGNOSIS — I5032 Chronic diastolic (congestive) heart failure: Secondary | ICD-10-CM | POA: Diagnosis not present

## 2022-01-25 DIAGNOSIS — C3491 Malignant neoplasm of unspecified part of right bronchus or lung: Secondary | ICD-10-CM | POA: Diagnosis not present

## 2022-01-25 DIAGNOSIS — Z9861 Coronary angioplasty status: Secondary | ICD-10-CM | POA: Diagnosis not present

## 2022-01-25 DIAGNOSIS — I2119 ST elevation (STEMI) myocardial infarction involving other coronary artery of inferior wall: Secondary | ICD-10-CM

## 2022-01-25 DIAGNOSIS — I10 Essential (primary) hypertension: Secondary | ICD-10-CM

## 2022-01-25 DIAGNOSIS — E785 Hyperlipidemia, unspecified: Secondary | ICD-10-CM

## 2022-01-25 DIAGNOSIS — R6889 Other general symptoms and signs: Secondary | ICD-10-CM | POA: Diagnosis not present

## 2022-01-25 MED ORDER — PRAVASTATIN SODIUM 80 MG PO TABS: ORAL_TABLET | ORAL | 3 refills | Status: DC

## 2022-01-25 MED ORDER — ISOSORBIDE MONONITRATE ER 30 MG PO TB24: ORAL_TABLET | ORAL | 3 refills | Status: DC

## 2022-01-25 MED ORDER — CLOPIDOGREL BISULFATE 75 MG PO TABS: ORAL_TABLET | ORAL | 3 refills | Status: DC

## 2022-01-25 MED ORDER — PRAVASTATIN SODIUM 80 MG PO TABS
ORAL_TABLET | ORAL | 3 refills | Status: DC
Start: 2022-01-25 — End: 2023-05-19

## 2022-01-25 NOTE — Patient Instructions (Signed)
Medication Instructions:  Your physician recommends that you continue on your current medications as directed. Please refer to the Current Medication list given to you today.  *If you need a refill on your cardiac medications before your next appointment, please call your pharmacy*   Lab Work: NONE If you have labs (blood work) drawn today and your tests are completely normal, you will receive your results only by: [place] (if you have MyChart) OR A paper copy in the mail If you have any lab test that is abnormal or we need to change your treatment, we will call you to review the results.   Testing/Procedures: NONE   Follow-Up: At [facility], you and your health needs are our priority.  As part of our continuing mission to provide you with exceptional heart care, we have created designated Provider Care Teams.  These Care Teams include your primary Cardiologist (physician) and Advanced Practice Providers (APPs -  Physician Assistants and Nurse Practitioners) who all work together to provide you with the care you need, when you need it.  We recommend signing up for the patient portal called "MyChart".  Sign up information is provided on this After Visit Summary.  MyChart is used to connect with patients for Virtual Visits (Telemedicine).  Patients are able to view lab/test results, encounter notes, upcoming appointments, etc.  Non-urgent messages can be sent to your provider as well.   To learn more about what you can do with MyChart, go to [email or web address].    Your next appointment:   6 month(s)  The format for your next appointment:   In Person  Provider:   Glenetta Hew, MD

## 2022-01-27 ENCOUNTER — Encounter (INDEPENDENT_AMBULATORY_CARE_PROVIDER_SITE_OTHER): Payer: Self-pay

## 2022-01-29 ENCOUNTER — Ambulatory Visit
Admission: RE | Admit: 2022-01-29 | Discharge: 2022-01-29 | Disposition: A | Discharge status: Home / Self Care | Payer: Medicare HMO | Source: Ambulatory Visit | Attending: Family Medicine | Admitting: Family Medicine

## 2022-01-29 DIAGNOSIS — R6889 Other general symptoms and signs: Secondary | ICD-10-CM | POA: Diagnosis not present

## 2022-01-29 DIAGNOSIS — M810 Age-related osteoporosis without current pathological fracture: Secondary | ICD-10-CM | POA: Diagnosis not present

## 2022-01-29 DIAGNOSIS — M8588 Other specified disorders of bone density and structure, other site: Secondary | ICD-10-CM | POA: Diagnosis not present

## 2022-01-29 DIAGNOSIS — Z780 Asymptomatic menopausal state: Secondary | ICD-10-CM | POA: Diagnosis not present

## 2022-01-29 DIAGNOSIS — E2839 Other primary ovarian failure: Secondary | ICD-10-CM

## 2022-02-07 ENCOUNTER — Other Ambulatory Visit: Payer: Self-pay | Admitting: *Deleted

## 2022-02-07 ENCOUNTER — Ambulatory Visit: Payer: Medicare HMO | Admitting: Internal Medicine

## 2022-02-07 DIAGNOSIS — R6889 Other general symptoms and signs: Secondary | ICD-10-CM | POA: Diagnosis not present

## 2022-02-07 DIAGNOSIS — H7293 Unspecified perforation of tympanic membrane, bilateral: Secondary | ICD-10-CM | POA: Diagnosis not present

## 2022-02-07 MED ORDER — SPIRIVA HANDIHALER 18 MCG IN CAPS: 18.0000 ug | ORAL_CAPSULE | Freq: Every day | RESPIRATORY_TRACT | 3 refills | Status: DC

## 2022-02-14 ENCOUNTER — Inpatient Hospital Stay: Payer: Medicare HMO | Attending: Internal Medicine | Admitting: Internal Medicine

## 2022-02-14 VITALS — BP 128/66 | HR 89 | Temp 97.9°F | Resp 17 | Ht 65.5 in | Wt 139.4 lb

## 2022-02-14 DIAGNOSIS — Z808 Family history of malignant neoplasm of other organs or systems: Secondary | ICD-10-CM | POA: Diagnosis not present

## 2022-02-14 DIAGNOSIS — Z8051 Family history of malignant neoplasm of kidney: Secondary | ICD-10-CM | POA: Insufficient documentation

## 2022-02-14 DIAGNOSIS — M545 Low back pain, unspecified: Secondary | ICD-10-CM | POA: Diagnosis not present

## 2022-02-14 DIAGNOSIS — Z800 Family history of malignant neoplasm of digestive organs: Secondary | ICD-10-CM | POA: Insufficient documentation

## 2022-02-14 DIAGNOSIS — M25562 Pain in left knee: Secondary | ICD-10-CM | POA: Diagnosis not present

## 2022-02-14 DIAGNOSIS — G8929 Other chronic pain: Secondary | ICD-10-CM | POA: Diagnosis not present

## 2022-02-14 DIAGNOSIS — M25561 Pain in right knee: Secondary | ICD-10-CM | POA: Diagnosis not present

## 2022-02-14 DIAGNOSIS — R6889 Other general symptoms and signs: Secondary | ICD-10-CM | POA: Diagnosis not present

## 2022-02-14 DIAGNOSIS — F17210 Nicotine dependence, cigarettes, uncomplicated: Secondary | ICD-10-CM | POA: Diagnosis not present

## 2022-02-14 DIAGNOSIS — Z8049 Family history of malignant neoplasm of other genital organs: Secondary | ICD-10-CM | POA: Diagnosis not present

## 2022-02-14 DIAGNOSIS — Z780 Asymptomatic menopausal state: Secondary | ICD-10-CM | POA: Diagnosis not present

## 2022-02-14 DIAGNOSIS — C3490 Malignant neoplasm of unspecified part of unspecified bronchus or lung: Secondary | ICD-10-CM | POA: Insufficient documentation

## 2022-02-14 DIAGNOSIS — R519 Headache, unspecified: Secondary | ICD-10-CM | POA: Insufficient documentation

## 2022-02-14 DIAGNOSIS — G629 Polyneuropathy, unspecified: Secondary | ICD-10-CM

## 2022-02-14 DIAGNOSIS — M5416 Radiculopathy, lumbar region: Secondary | ICD-10-CM | POA: Diagnosis present

## 2022-02-14 MED ORDER — AMITRIPTYLINE HCL 50 MG PO TABS: 50.0000 mg | ORAL_TABLET | Freq: Every day | ORAL | 2 refills | Status: DC
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
## 2022-02-19 ENCOUNTER — Telehealth: Payer: Self-pay | Admitting: *Deleted

## 2022-02-19 ENCOUNTER — Other Ambulatory Visit: Payer: Self-pay | Admitting: *Deleted

## 2022-02-19 DIAGNOSIS — G629 Polyneuropathy, unspecified: Secondary | ICD-10-CM

## 2022-02-19 DIAGNOSIS — C3491 Malignant neoplasm of unspecified part of right bronchus or lung: Secondary | ICD-10-CM

## 2022-02-19 NOTE — Telephone Encounter (Signed)
Order for MRI added

## 2022-02-28 IMAGING — CT CT HEAD W/O CM
3 series · 16 of 47 positions shown, 19 images · non-contrast
Comparison: 08/29/2017

CLINICAL DATA: Recent fall with headaches, initial encounter

EXAM:
CT HEAD WITHOUT CONTRAST
TECHNIQUE: Contiguous axial images were obtained from the base of the skull
through the vertex without intravenous contrast.

[Series 3: head 5.0 h30s · axial · 0.43mm/px · z∈[+1381,+1516]mm · 10 of 33 slices shown, 13 images]
[im 3/33  brain]
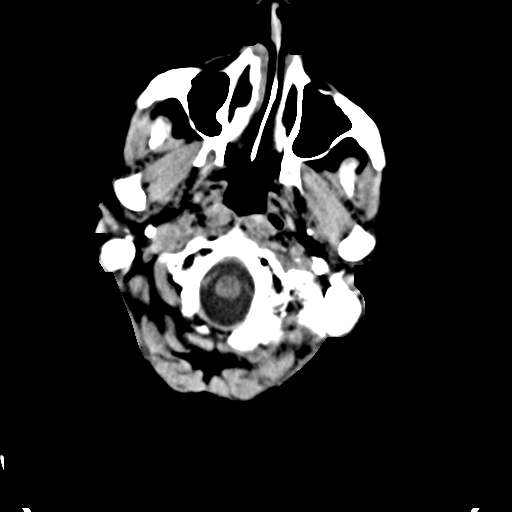
[im 3/33  bone]
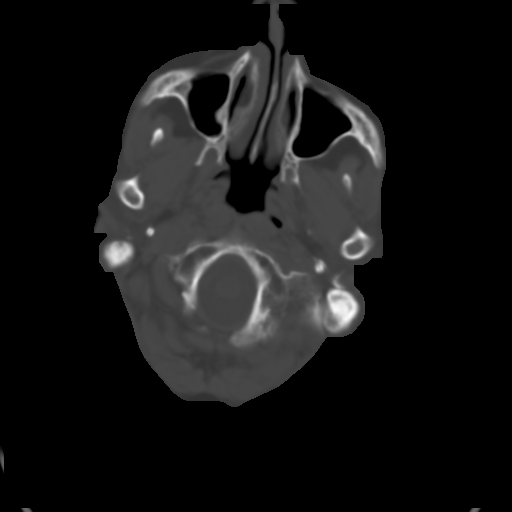
[im 6/33  brain]
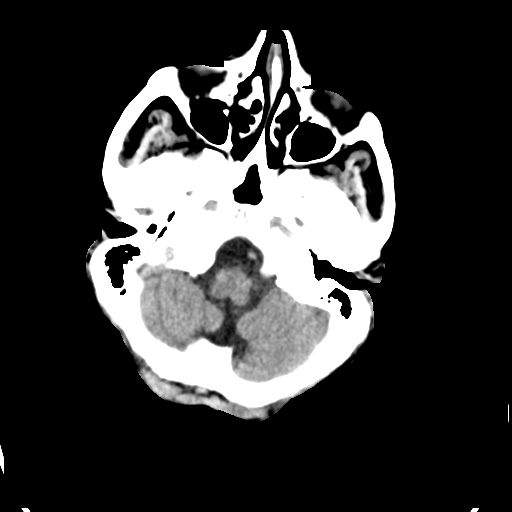
[im 9/33  brain]
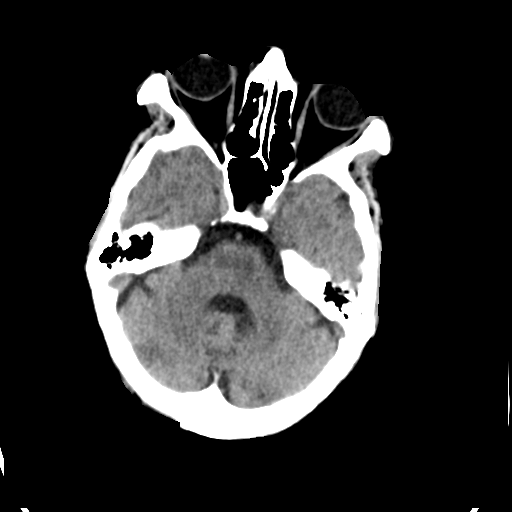
[im 12/33  brain]
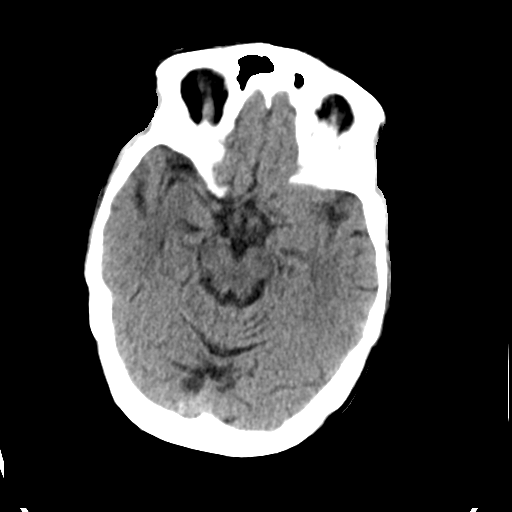
[im 15/33  brain]
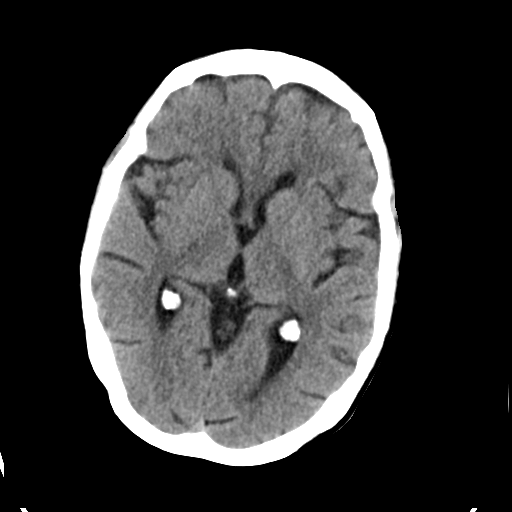
[im 15/33  bone]
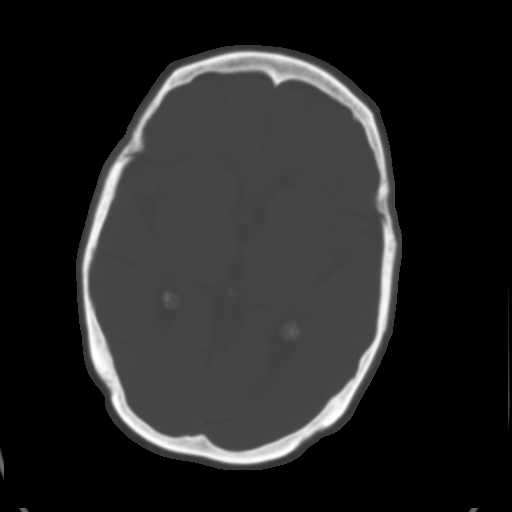
[im 18/33  brain]
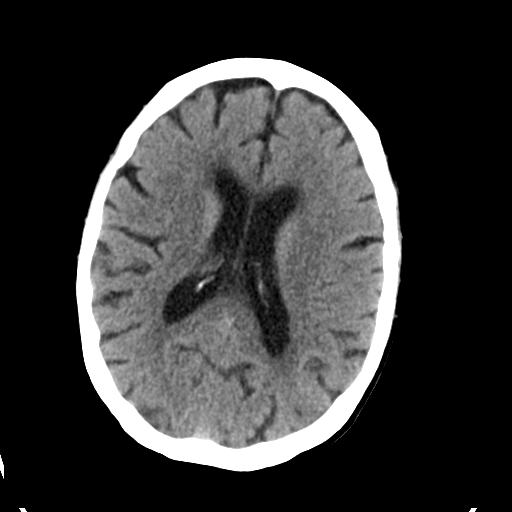
[im 21/33  brain]
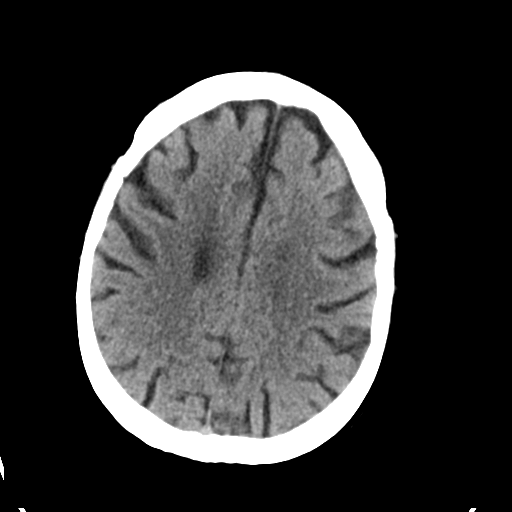
[im 25/33  brain]
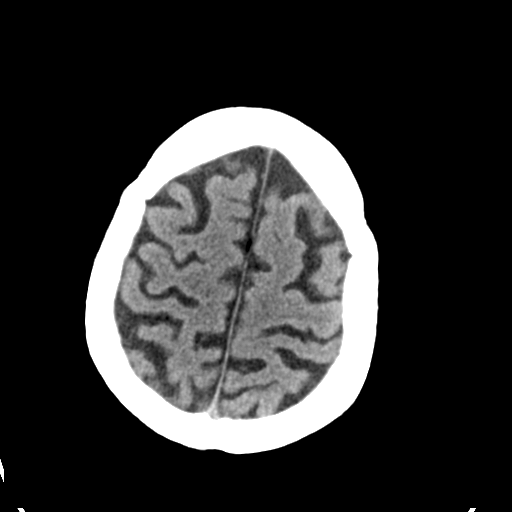
[im 27/33  brain]
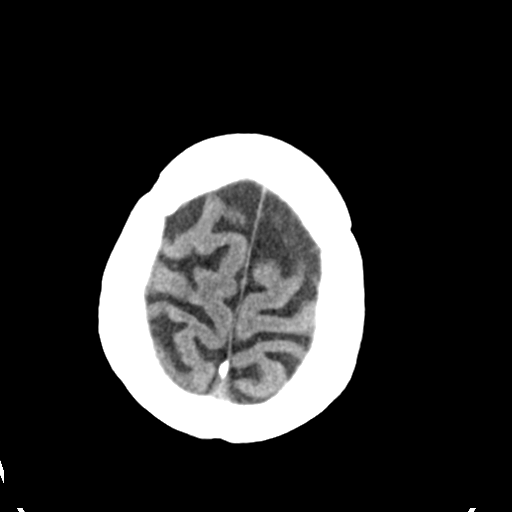
[im 27/33  bone]
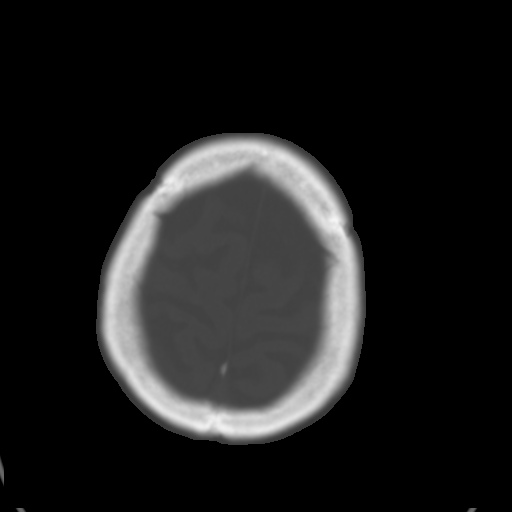
[im 30/33  brain]
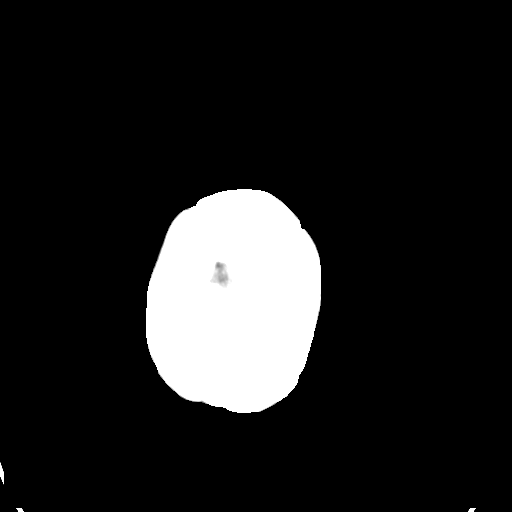

[Series 5: head 3.0 mpr cor · coronal · 0.32mm/px · 3 of 69 slices shown]
[im 23/69  brain]
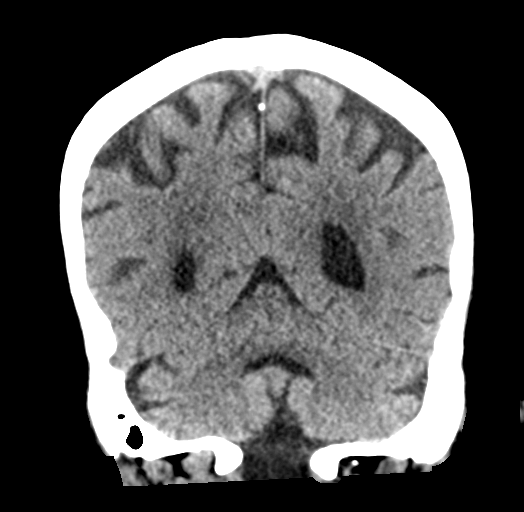
[im 31/69  brain]
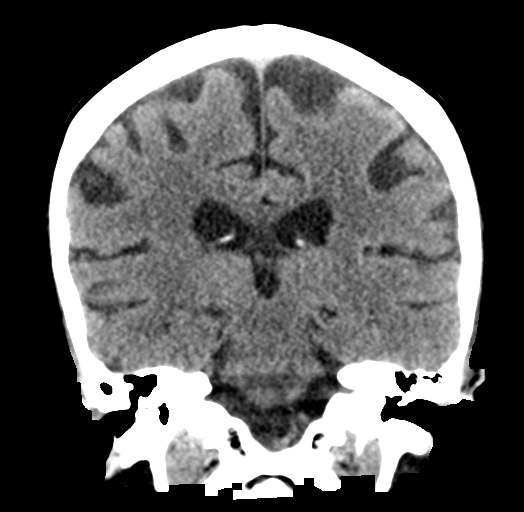
[im 38/69  brain]
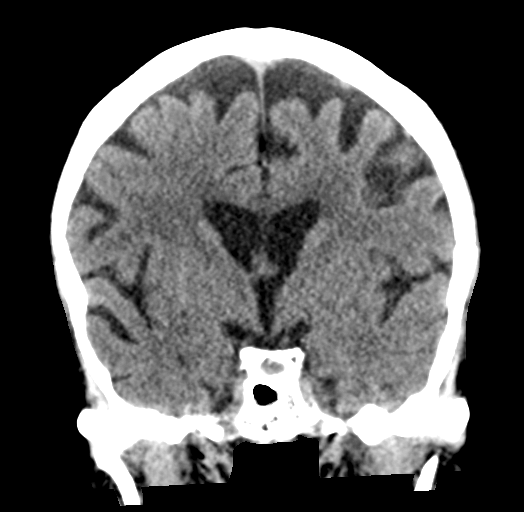

[Series 6: head 3.0 mpr sag · sagittal · 0.32mm/px · 3 of 65 slices shown]
[im 22/65  brain]
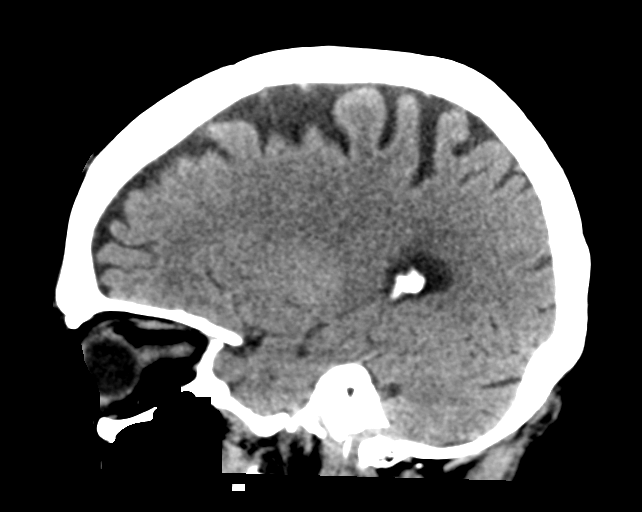
[im 33/65  brain]
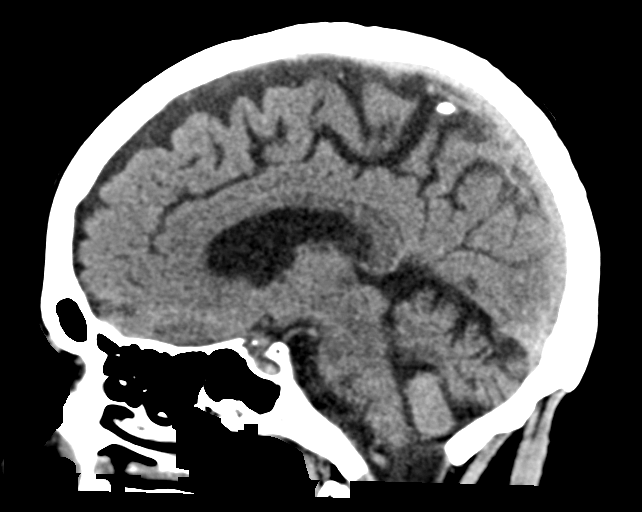
[im 43/65  brain]
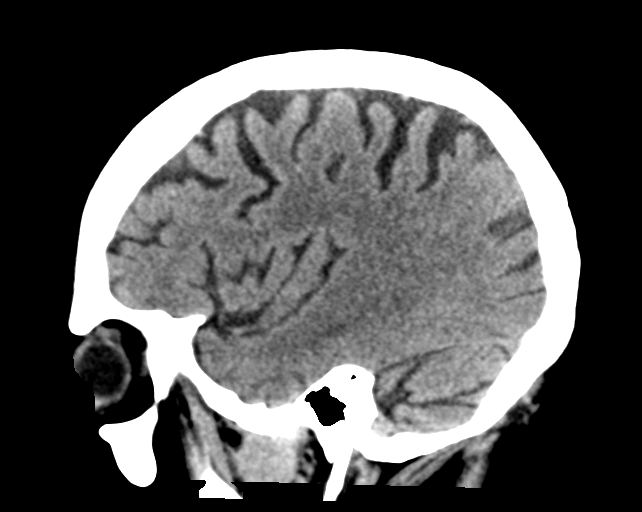

[16 of 47 positions shown; findings below may reference images not displayed]

FINDINGS: Brain: Mild atrophic changes are noted. No findings to suggest acute
hemorrhage, acute infarction or space-occupying mass lesion noted.

Vascular: No hyperdense vessel or unexpected calcification.

Skull: Normal. Negative for fracture or focal lesion.

Sinuses/Orbits: No acute finding.

Other: None.
IMPRESSION: Mild atrophic changes without acute abnormality.

## 2022-03-04 ENCOUNTER — Telehealth: Payer: Self-pay | Admitting: Pulmonary Disease

## 2022-03-04 NOTE — Telephone Encounter (Signed)
Called and spoke with patient. She was confused about her CT scan results. She was recently seen by her cardiologist who told her based on her CT scan, she has severe emphysema and only years left to live. She never heard anything in regards to results until today. She was under the impression our office had ordered the scan. I explained to her that the scan was ordered by oncology, not our office. She verbalized understanding.   She wanted to know if Dr. Halford Chessman could take a look at the CT scan that done on 04/25.  Dr. Halford Chessman, can you please advise? Thanks!

## 2022-03-04 NOTE — Telephone Encounter (Signed)
Patient states she never was called with imaging results and her heart doctor had to tell her results. Informed patient I only saw imaging from 06/2021 that was ordered by our office. Patient denied appointment.  Please call patient back to discuss.

## 2022-03-05 NOTE — Telephone Encounter (Signed)
Called and scheduled a  MYCHART video visit for wed. 03-06-22 afternoon. Nothing further needed

## 2022-03-05 NOTE — Telephone Encounter (Signed)
Can you schedule her for a video visit with me this week sometime in the afternoon and I can discuss her CT chest findings in more detail.

## 2022-03-06 ENCOUNTER — Telehealth (INDEPENDENT_AMBULATORY_CARE_PROVIDER_SITE_OTHER): Payer: Medicare HMO | Admitting: Pulmonary Disease

## 2022-03-06 ENCOUNTER — Encounter: Payer: Self-pay | Admitting: Pulmonary Disease

## 2022-03-06 DIAGNOSIS — J479 Bronchiectasis, uncomplicated: Secondary | ICD-10-CM | POA: Diagnosis not present

## 2022-03-06 DIAGNOSIS — J439 Emphysema, unspecified: Secondary | ICD-10-CM

## 2022-03-06 DIAGNOSIS — C3491 Malignant neoplasm of unspecified part of right bronchus or lung: Secondary | ICD-10-CM

## 2022-03-06 DIAGNOSIS — J4489 Other specified chronic obstructive pulmonary disease: Secondary | ICD-10-CM | POA: Diagnosis not present

## 2022-03-06 MED ORDER — LEVALBUTEROL HCL 0.63 MG/3ML IN NEBU: 0.6300 mg | INHALATION_SOLUTION | RESPIRATORY_TRACT | 1 refills | Status: DC | PRN

## 2022-03-06 NOTE — Progress Notes (Signed)
[facility]  Chief Complaint  Patient presents with   Follow-up    Questions about CT chest results    Past Surgical History:  She  has a past surgical history that includes Leg wound repair / closure (1972); Coronary angioplasty with stent (10/10/2011); Coronary angioplasty with stent (11/06/2011); Tonsillectomy; Tubal ligation (1970's); Knee surgery; Coronary angioplasty with stent (11/19/2012); NM MYOVIEW LTD (October 2013; 12/2013); CPET (09/07/2012); left heart catheterization with coronary angiogram (N/A, 10/10/2011); percutaneous coronary stent intervention (pci-s) (N/A, 11/06/2011); left heart catheterization with coronary angiogram (N/A, 11/19/2012); left heart catheterization with coronary angiogram (N/A, 02/19/2013); left heart catheterization with coronary angiogram (N/A, 03/02/2014); Colonoscopy; Polypectomy; Video bronchoscopy with endobronchial ultrasound (N/A, 02/14/2016); Direct laryngoscopy (N/A, 02/14/2016); OTHER SURGICAL HISTORY; Breast biopsy (2000's); NM MYOVIEW LTD (02/2016); lipoma surgery (Left, 10/2016); transthoracic echocardiogram (08/30/2017); Esophagogastroduodenoscopy (egd) with propofol (N/A, 10/29/2018); EVENT MONITOR (03/2019); and transthoracic echocardiogram (02/02/2021).  Past Medical History:  Vitamin D deficiency, Seizures, RLS, Chronic back pain, HTN, Hiatal hernia, GERD, HLD, Palpitations, Depression, CAD, Cataracts, OA, Anxiety, COVID 22 May 2019  Constitutional:  There were no vitals taken for this visit.  Brief Summary:  Michele [name] is a 66 y.o. female former smoker with COPD/emphysema.  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      Subjective:  Virtual Visit via Video Note  I connected with Michele [name] on 03/06/22 at  2:30 PM EDT by a video enabled telemedicine application and verified that I am speaking with the correct person using two identifiers.  Location: Patient:  home Provider: medical office   I discussed the limitations of evaluation and management by telemedicine and the availability of in person appointments. The patient expressed understanding and agreed to proceed.  History of Present Illness:  Attempted video visit, but she had trouble with connectivity.  Remainder of assessment done by phone.  She had a CT chest with oncology in April 2023.  This showed changes of emphysema, coronary atherosclerosis, small hiatal hernia, and stable lung nodule.  She didn't remember anyone speaking to her about these results.  She was seen by a provider recently, and was told that she only had 1 to 2 years left to live based on her CT chest findings.  This made her very worried and she asked to speak with pulmonary to assess further.  She reports that she quit smoking.    She still has cough with clear sputum.  She hasn't been using xopenex or her flutter valve. She has been using mucinex but only at night.  Not having fever, wheeze, chest pain, or hemoptysis.  She reports she is using spiriva and arnuity.   Physical Exam:   Speaking fluently w/o signs of respiratory distress.  Pulmonary testing:  PFT 12/23/11 >> FEV1 2.13 (85%), FEV1% 59, TLC 5.98 (112%), DLCO 60% Spirometry 11/19/17 >> FEV1 2.58 (100%), FEV1% 76 FeNO 11/19/17 >> 5  Chest Imaging:  CT chest 08/24/16 >> CAD, mod/severe emphysema CT chest 08/29/17 >> severe emphysema CT chest 03/02/18 >> severe centrilobular and paraseptal emphysema, 4 mm nodule RUL partially calcified CT angio chest 04/27/18 >> atherosclerosis, small/mod hiatal hernia, dilation of esophagus with mucosal thickening, severe emphysema with large bulla, bronchiectasis CT chest 09/02/19 >> severe upper lobe predominant emphysema CT chest 09/25/21 >> atherosclerosis, small HH, progressive emphysema, XRT changes to Rt hilum  Cardiac Tests:  Echo 02/02/21 >> EF 65 to 70%, grade 1 DD, mild mR  Social History:  She  reports that she  has been smoking cigarettes. She has a 80.00 pack-year smoking history. She has never used smokeless tobacco. She reports that she does not drink alcohol and does not use drugs.  Family History:  Her family history includes Asthma in her mother; Cancer in her maternal grandmother; Colon polyps in her mother; Diabetes in her mother; Emphysema in her mother; Heart attack in her maternal grandfather; Heart disease in her father and mother; Hyperlipidemia in her mother; Hypertension in her mother; Kidney cancer in her brother; Stomach cancer in her brother and brother; Stroke in her mother; Stroke (age of onset: 71) in her brother; Thyroid cancer in her daughter.     Assessment/Plan:   COPD with chronic bronchitis, emphysema, and bronchiectasis. - she is intolerant of LABA's and albuterol due to palpitations - continue spiriva, arnuity - she can use xopenex, mucinex and flutter twice per day as needed to help with cough and expectoration - discussed symptoms to monitor for that would indicate she needs antibiotics - explained the natural history of COPD and that it is not possible to predict with absolute certainty an individuals life expectancy   Tobacco abuse. -  congratulated her on her smoking cessation  Limited stage SCLC. - s/p cisplatin/etoposide with concurrent XRT and cranial XRT - observation since 2017 - followed by Dr. Curt Bears with oncology - follow up CT chest with oncology; explained that CT chest from April 2023 did not show any signs of cancer recurrence  CAD s/p stents. - followed by Dr. Glenetta Hew with cardiology  Time Spent Involved in Patient Care on Day of Examination:  39 minutes  Follow up:   Patient Instructions  Follow up in 6 months  Medication List:   Allergies as of 03/06/2022       Reactions   Ciprofloxacin Other (See Comments)   Hypoglycemia    Aspirin Other (See Comments)   GI upset; patient is only able to take enteric coated Aspirin.      Crestor [rosuvastatin] Other (See Comments)   Muscle pain    Ibuprofen Other (See Comments)   GI upset    Wellbutrin [bupropion] Palpitations   Amitiza [lubiprostone] Other (See Comments)   Abdominal pain   Penicillin G    Prednisone Other (See Comments)   Heart palpations   Zoloft [sertraline Hcl] Other (See Comments)   Insomnia, fatigue    Albuterol Palpitations   Lipitor [atorvastatin] Other (See Comments)   Muscle pain    Sulfonamide Derivatives Itching, Rash        Medication List        Accurate as of March 06, 2022  3:14 PM. If you have any questions, ask your nurse or doctor.          acetaminophen 325 MG tablet Commonly known as: TYLENOL Take 2 tablets (650 mg total) by mouth every 6 (six) hours as needed for mild pain, fever or headache.   ALPRAZolam 1 MG tablet Commonly known as: XANAX Take 1 mg by mouth as needed.   amitriptyline 50 MG tablet Commonly known as: ELAVIL Take 1 tablet (50 mg total) by mouth at bedtime.   Arnuity Ellipta 100 MCG/ACT Aepb Generic drug: Fluticasone Furoate Take 1 puff by mouth daily.   Centrum Silver Ultra Womens Tabs See admin instructions.   CITRACAL + D PO Take 1 tablet by mouth in the morning and at bedtime.   clopidogrel 75 MG tablet Commonly known as: PLAVIX TAKE 1 TABLET BY  MOUTH EVERY MONDAY, WEDNESDAY AND FRIDAY   co-enzyme Q-10 30 MG capsule Take 30 mg by mouth daily.   fluticasone 50 MCG/ACT nasal spray Commonly known as: FLONASE INSTILL 1 SPRAY INTO BOTH NOSTRILS DAILY   Flutter Devi 1 application by Does not apply route 2 (two) times daily.   furosemide 20 MG tablet Commonly known as: LASIX TAKE 1 TABLET(20 MG) BY MOUTH DAILY   guaiFENesin 600 MG 12 hr tablet Commonly known as: Mucinex Take 1 tablet (600 mg total) by mouth 2 (two) times daily.   isosorbide mononitrate 30 MG 24 hr tablet Commonly known as: IMDUR TAKE 1 TABLET(30 MG) BY MOUTH AT BEDTIME   levalbuterol 0.63 MG/3ML  nebulizer solution Commonly known as: Xopenex Take 3 mLs (0.63 mg total) by nebulization every 4 (four) hours as needed for wheezing or shortness of breath (dx: R00.2, J44.9).   levalbuterol 45 MCG/ACT inhaler Commonly known as: XOPENEX HFA INHALE 2 PUFFS BY MOUTH EVERY 6 HOURS AS NEEDED   nicotine 10 MG inhaler Commonly known as: Nicotrol Inhale 1 Cartridge (1 continuous puffing total) into the lungs as needed for smoking cessation.   nitroGLYCERIN 0.4 MG SL tablet Commonly known as: Nitrostat Place 1 tablet (0.4 mg total) under the tongue every 5 (five) minutes as needed for chest pain.   polyethylene glycol 17 g packet Commonly known as: MIRALAX / GLYCOLAX Take 17 g by mouth daily as needed for mild constipation.   pravastatin 80 MG tablet Commonly known as: PRAVACHOL Take 1 tablet by mouth every Monday, Wednesday, Friday   Repatha SureClick 599 MG/ML Soaj Generic drug: Evolocumab Inject 140 mg into the skin every 14 (fourteen) days.   Restasis 0.05 % ophthalmic emulsion Generic drug: cycloSPORINE Place 1 drop into both eyes 2 (two) times daily.   sodium chloride HYPERTONIC 3 % nebulizer solution Take by nebulization as needed for cough.   Spiriva HandiHaler 18 MCG inhalation capsule Generic drug: tiotropium Place 1 capsule (18 mcg total) into inhaler and inhale daily.   Vascepa 1 g capsule Generic drug: icosapent Ethyl TAKE 1 CAPSULE BY MOUTH TWICE DAILY        Signature:  [name], MD [place] [facility] Pager - [phone] 03/06/2022, 3:14 PM

## 2022-03-06 NOTE — Patient Instructions (Signed)
Follow up in 6 months 

## 2022-03-07 DIAGNOSIS — R296 Repeated falls: Secondary | ICD-10-CM | POA: Diagnosis not present

## 2022-03-07 DIAGNOSIS — I2510 Atherosclerotic heart disease of native coronary artery without angina pectoris: Secondary | ICD-10-CM | POA: Diagnosis not present

## 2022-03-07 DIAGNOSIS — R6889 Other general symptoms and signs: Secondary | ICD-10-CM | POA: Diagnosis not present

## 2022-03-07 DIAGNOSIS — E7800 Pure hypercholesterolemia, unspecified: Secondary | ICD-10-CM | POA: Diagnosis not present

## 2022-03-07 DIAGNOSIS — J449 Chronic obstructive pulmonary disease, unspecified: Secondary | ICD-10-CM | POA: Diagnosis not present

## 2022-03-07 DIAGNOSIS — K219 Gastro-esophageal reflux disease without esophagitis: Secondary | ICD-10-CM | POA: Diagnosis not present

## 2022-03-07 DIAGNOSIS — M5416 Radiculopathy, lumbar region: Secondary | ICD-10-CM | POA: Diagnosis not present

## 2022-03-08 ENCOUNTER — Telehealth: Payer: Self-pay | Admitting: Pulmonary Disease

## 2022-03-08 ENCOUNTER — Other Ambulatory Visit: Payer: Self-pay

## 2022-03-08 DIAGNOSIS — I5189 Other ill-defined heart diseases: Secondary | ICD-10-CM

## 2022-03-08 DIAGNOSIS — R0609 Other forms of dyspnea: Secondary | ICD-10-CM

## 2022-03-08 MED ORDER — FUROSEMIDE 20 MG PO TABS: ORAL_TABLET | ORAL | 2 refills | Status: DC

## 2022-03-08 NOTE — Telephone Encounter (Signed)
ATC back for more information. No answer and was on hold for approx. 2-3 minutes. Will call back later.

## 2022-03-08 NOTE — Telephone Encounter (Signed)
Called [facility] and spoke to [place]. He states original caller was Joellen Jersey and she is at lunch but will have her return call when she comes back. This is a [facility] patient so gave Simona Huh [phone] for Joellen Jersey to call when she calls back in case there is no one in [facility] to take call when she calls back.

## 2022-03-12 NOTE — Telephone Encounter (Signed)
Called and there was no answer. Closing per protocol

## 2022-03-25 ENCOUNTER — Telehealth: Payer: Self-pay | Admitting: Cardiology

## 2022-03-25 NOTE — Telephone Encounter (Signed)
Pt c/o Shortness Of Breath: STAT if SOB developed within the last 24 hours or pt is noticeably SOB on the phone  1. Are you currently SOB (can you hear that pt is SOB on the phone)?   No  2. How long have you been experiencing SOB?   A few months  3. Are you SOB when sitting or when up moving around?   Patient stated she can hardly move around  4. Are you currently experiencing any other symptoms?  Constipation  Patient stated she feels very weak and she is not walking very well.  Patient stated she gets chest pains once in a while.

## 2022-03-25 NOTE — Telephone Encounter (Signed)
Returned call to patient no answer.Left message on personal voice mail to call back.

## 2022-03-25 NOTE — Telephone Encounter (Signed)
Spoke to patient follow up appointment scheduled with Dr.Harding 11/10 at 3:40 pm.

## 2022-03-26 DIAGNOSIS — K648 Other hemorrhoids: Secondary | ICD-10-CM | POA: Diagnosis not present

## 2022-03-26 DIAGNOSIS — R58 Hemorrhage, not elsewhere classified: Secondary | ICD-10-CM | POA: Diagnosis not present

## 2022-03-26 DIAGNOSIS — F419 Anxiety disorder, unspecified: Secondary | ICD-10-CM | POA: Diagnosis not present

## 2022-03-26 DIAGNOSIS — R1084 Generalized abdominal pain: Secondary | ICD-10-CM | POA: Diagnosis not present

## 2022-03-26 DIAGNOSIS — R079 Chest pain, unspecified: Secondary | ICD-10-CM | POA: Diagnosis not present

## 2022-03-26 DIAGNOSIS — R109 Unspecified abdominal pain: Secondary | ICD-10-CM | POA: Diagnosis not present

## 2022-03-26 DIAGNOSIS — I10 Essential (primary) hypertension: Secondary | ICD-10-CM | POA: Diagnosis not present

## 2022-03-28 IMAGING — DX DG CHEST 2V
2 series · 2 of 2 positions shown · non-contrast
Comparison: 08/24/2019

CLINICAL DATA: Left pleuritic pain, COPD

EXAM:
CHEST - 2 VIEW

[chest pa]
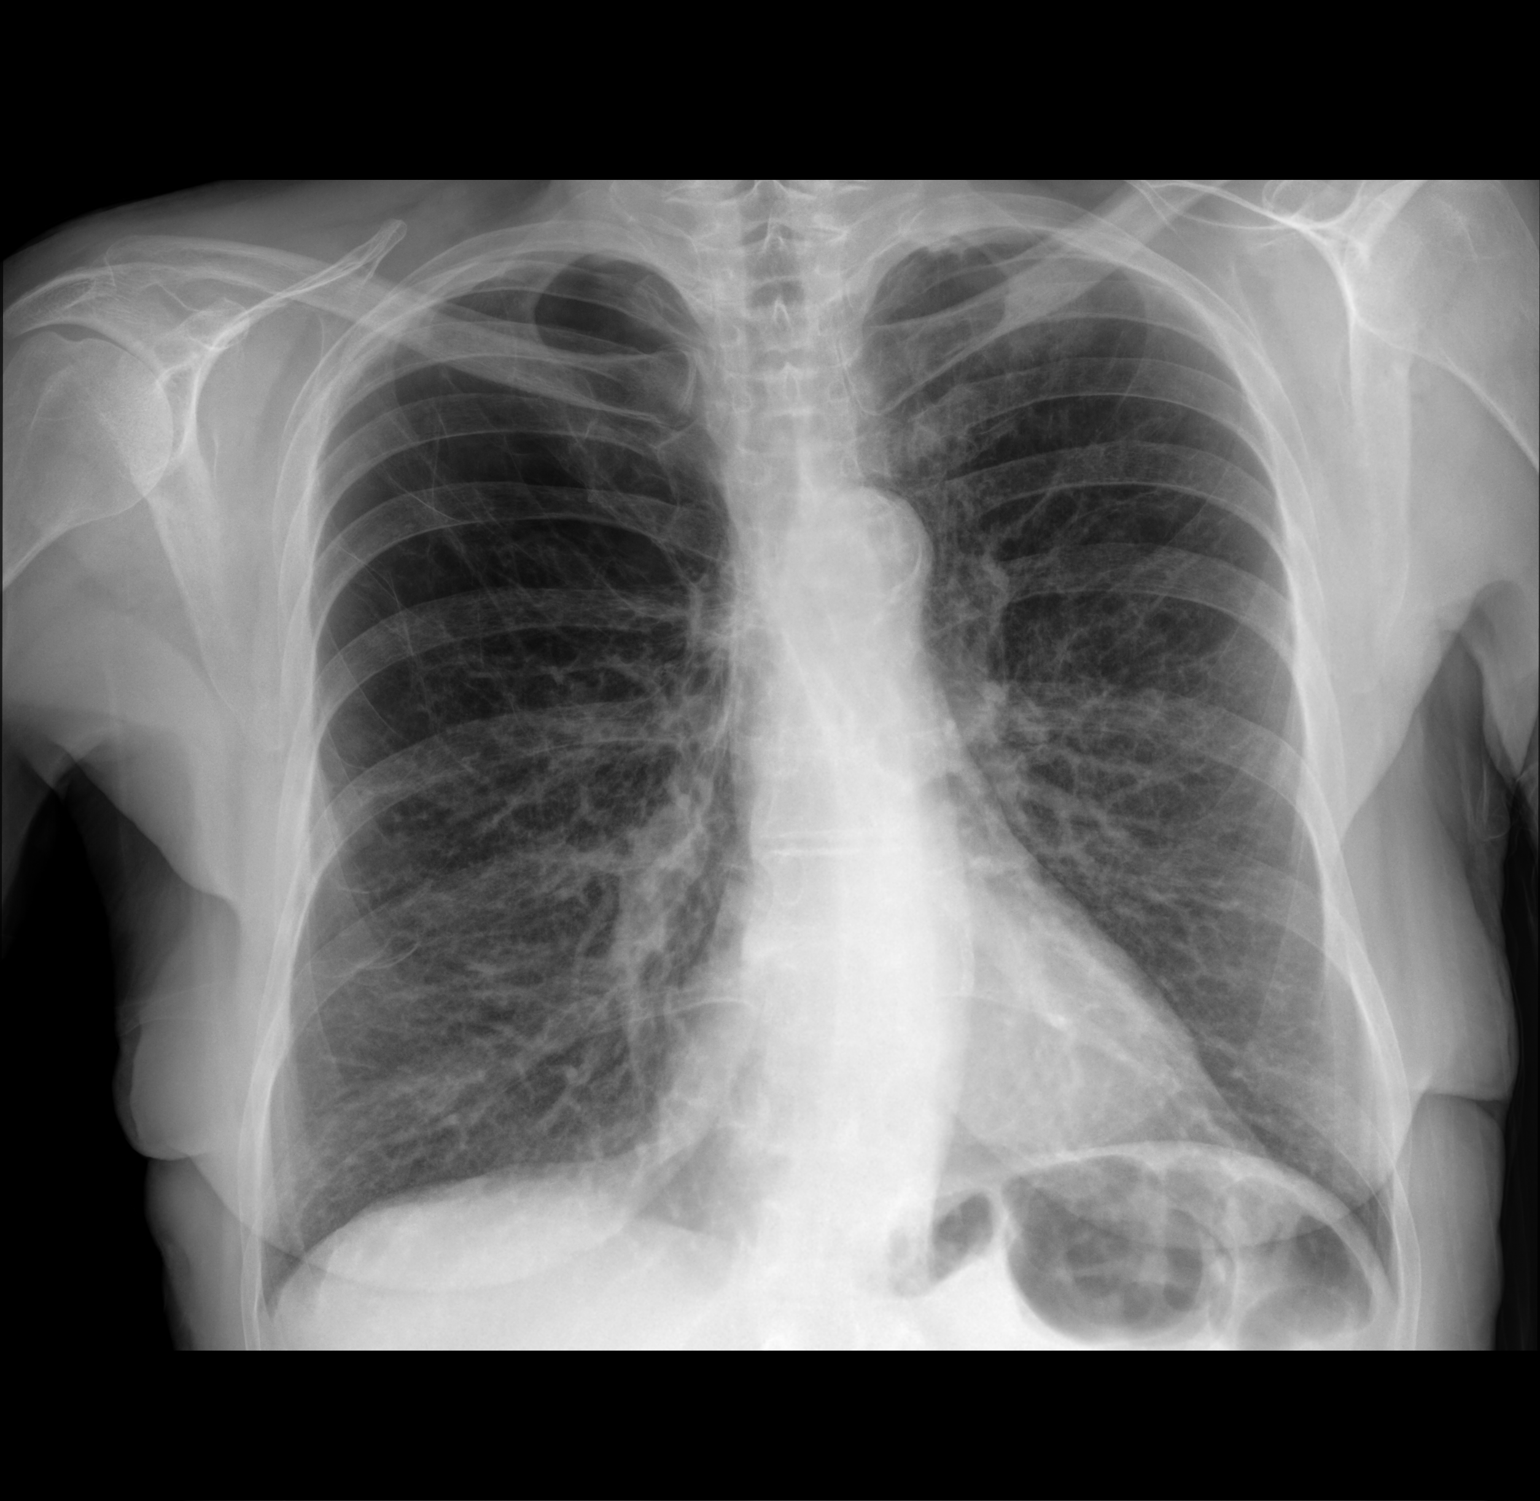

[chest lat]
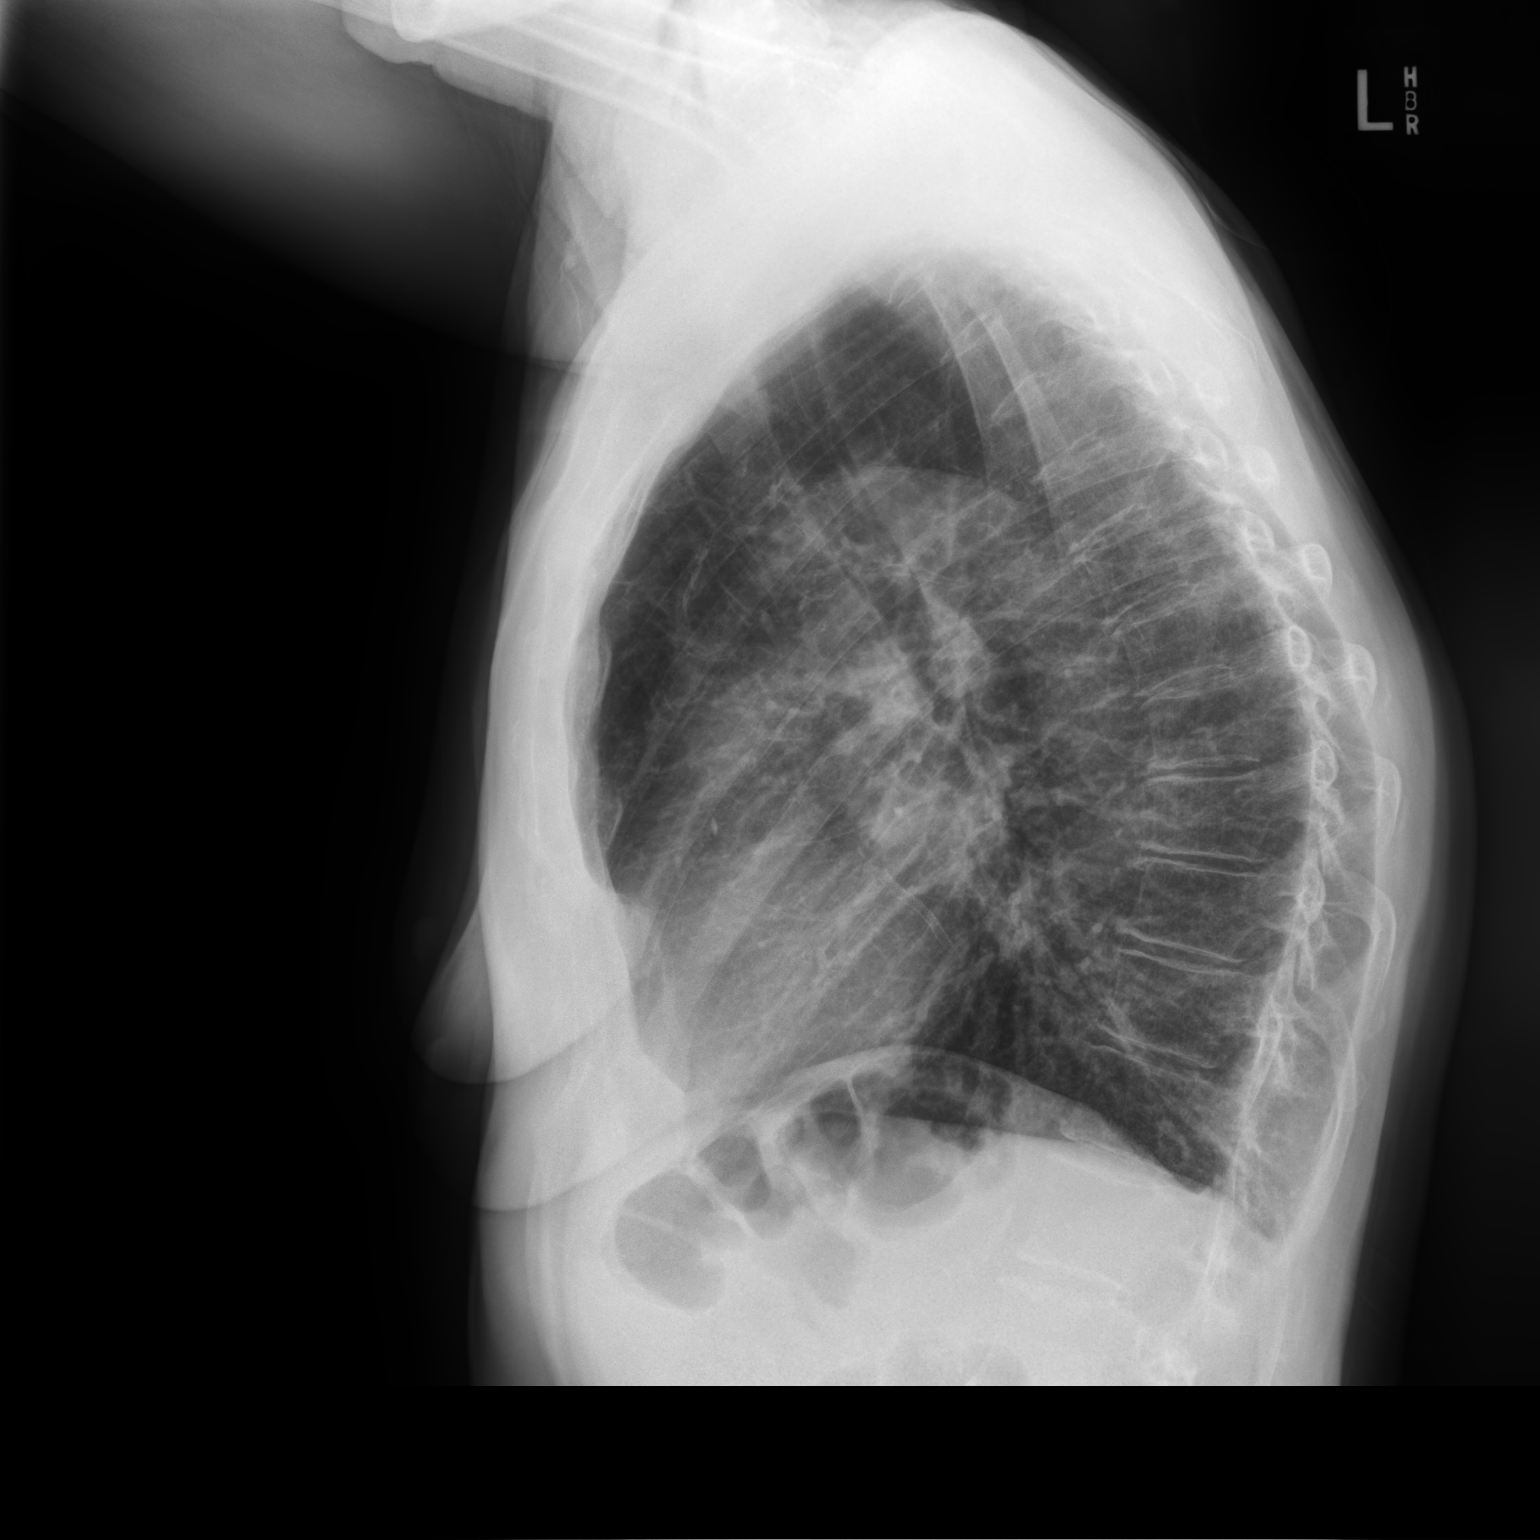

[2 of 2 positions shown; findings below may reference images not displayed]

FINDINGS: Background changes of COPD with chronic interstitial prominence. No
new consolidation or edema. No pleural effusion or pneumothorax
stable cardiomediastinal contours with normal heart size. Evidence
of coronary artery stenting. No acute osseous abnormality.
IMPRESSION: No acute process in the chest.

## 2022-03-29 IMAGING — CT CT ANGIO CHEST
2 of 6 series · 18 of 46 positions shown · IV contrast (OMNIPAQUE)
Comparison: CT scan dated 09/02/2019

CLINICAL DATA: Pleuritic pain. COPD with chronic bronchitis and
emphysema. Crackles in the left lung. History of small cell lung
cancer.

EXAM:
CT ANGIOGRAPHY CHEST WITH CONTRAST
TECHNIQUE: Multidetector CT imaging of the chest was performed using the
standard protocol during bolus administration of intravenous
contrast. Multiplanar CT image reconstructions and MIPs were
obtained to evaluate the vascular anatomy.
CONTRAST:  100mL OMNIPAQUE IOHEXOL 350 MG/ML SOLN

[Series 5: thins · axial · 0.64mm/px · z∈[-303,-59]mm · 16 of 268 slices shown]
[im 12/268  lung]
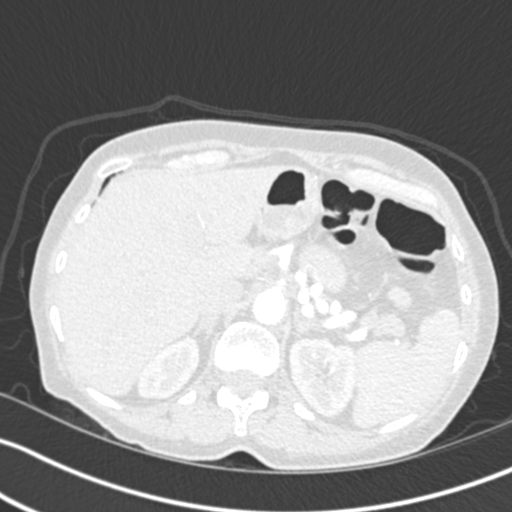
[im 35/268  soft-tissue]
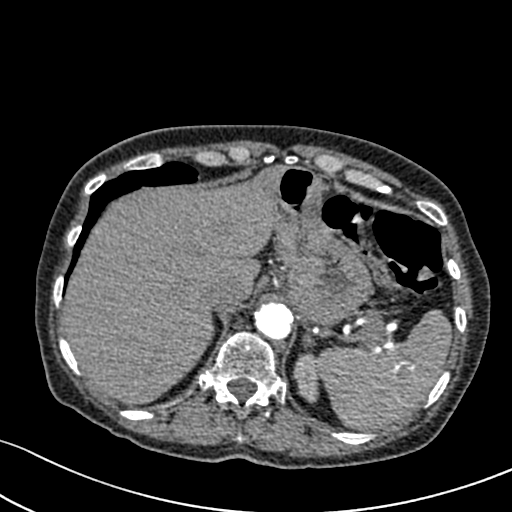
[im 47/268  lung]
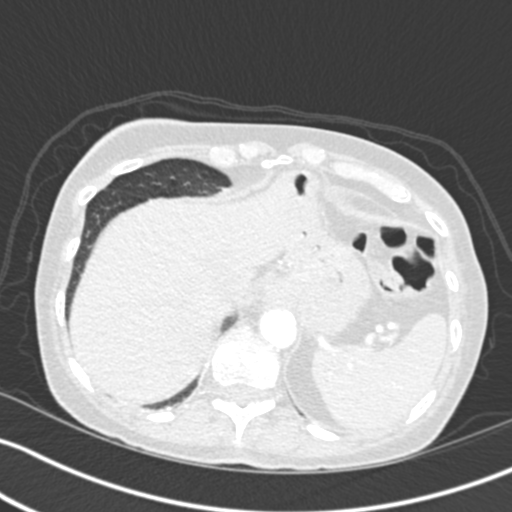
[im 59/268  soft-tissue]
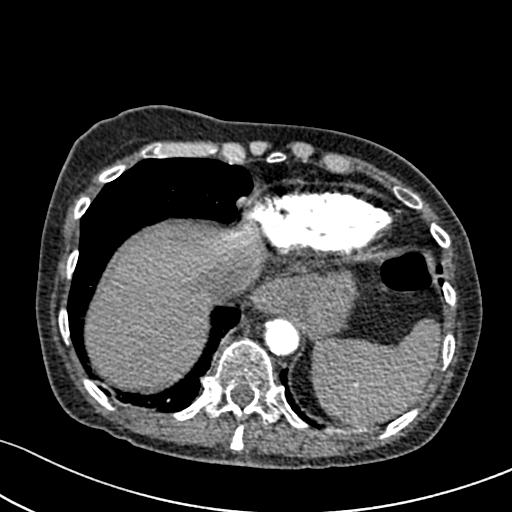
[im 82/268  lung]
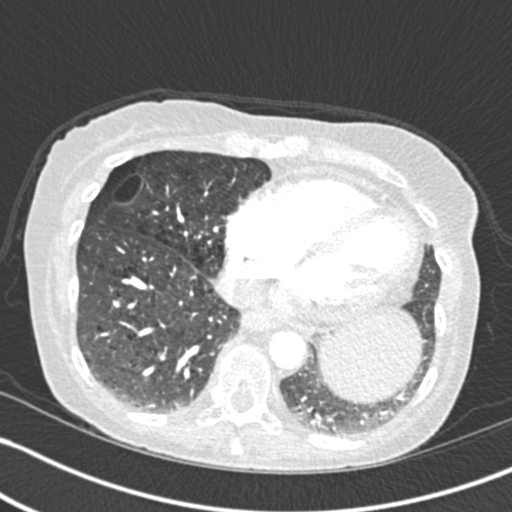
[im 93/268  soft-tissue]
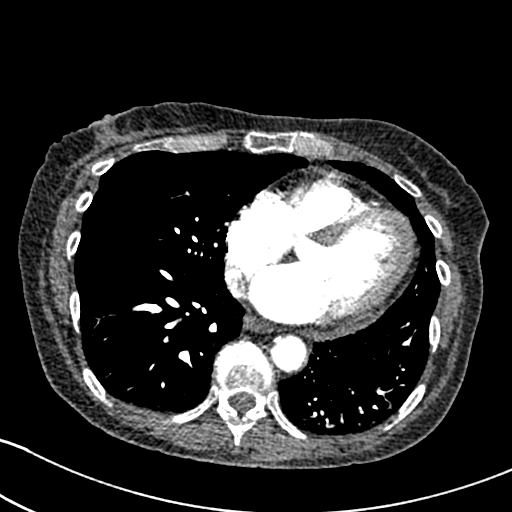
[im 105/268  lung]
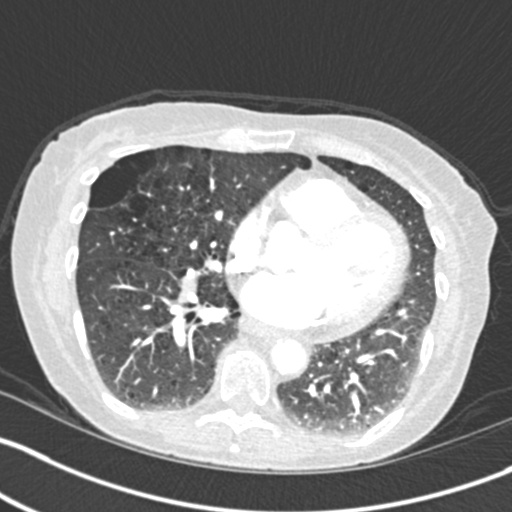
[im 128/268  soft-tissue]
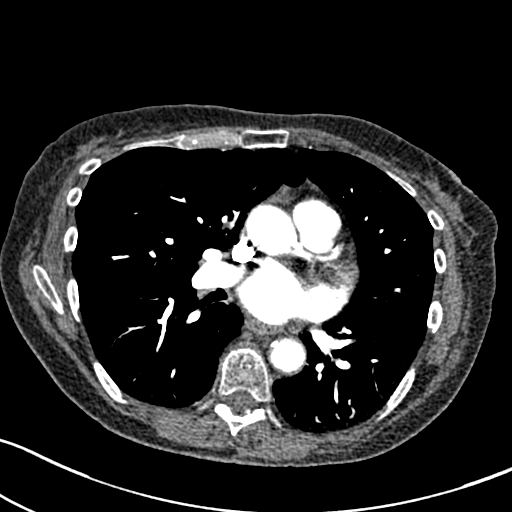
[im 140/268  lung]
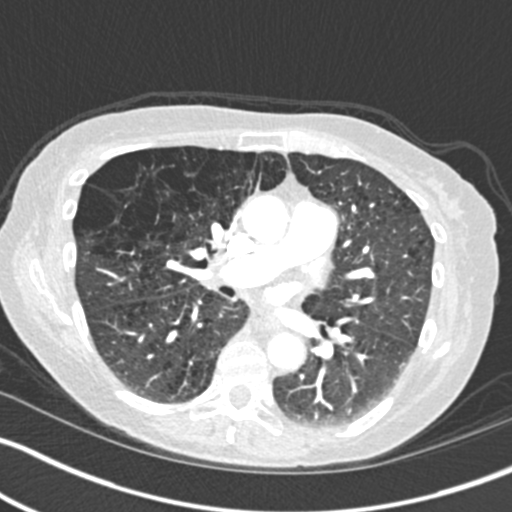
[im 163/268  soft-tissue]
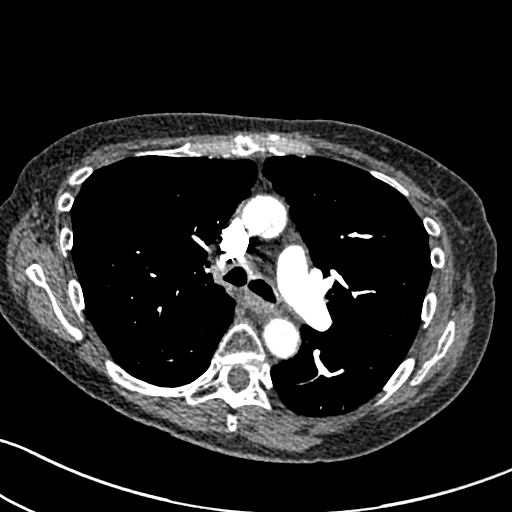
[im 175/268  lung]
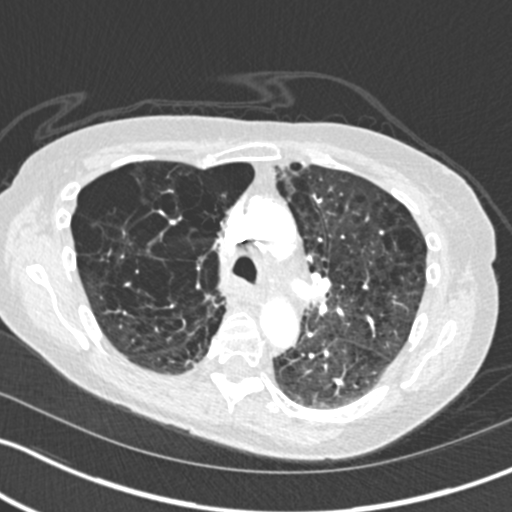
[im 186/268  soft-tissue]
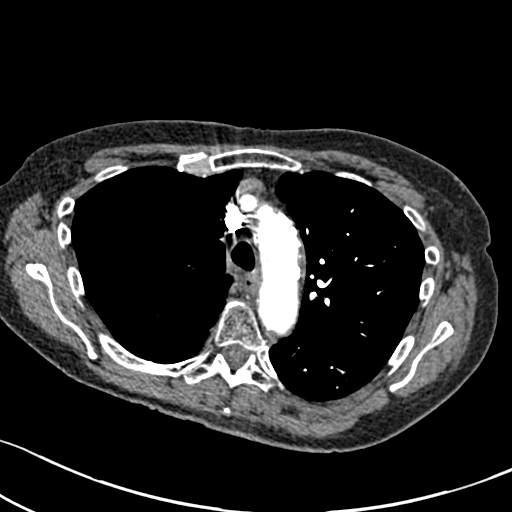
[im 209/268  lung]
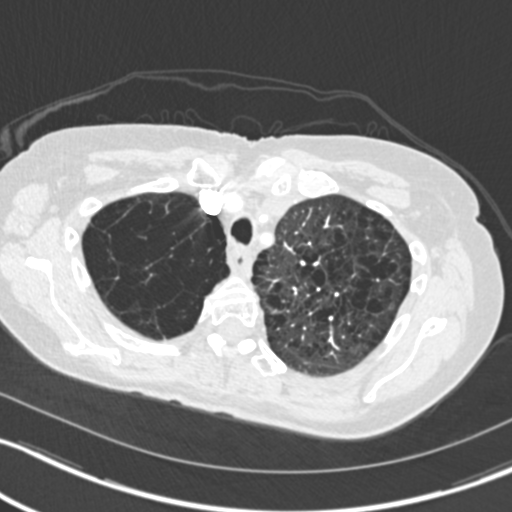
[im 221/268  soft-tissue]
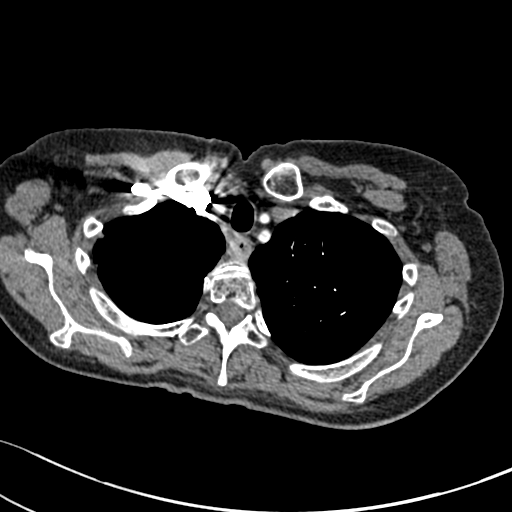
[im 233/268  lung]
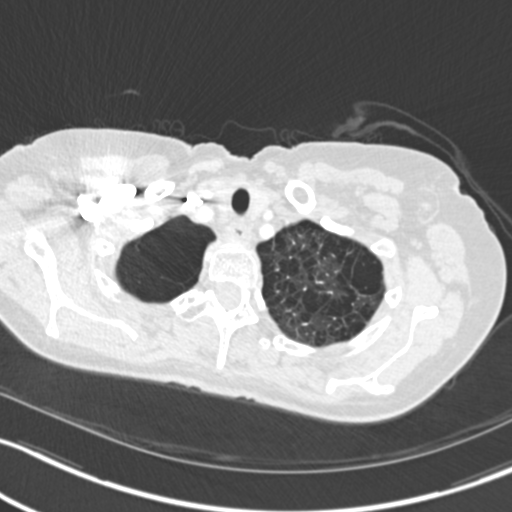
[im 256/268  soft-tissue]
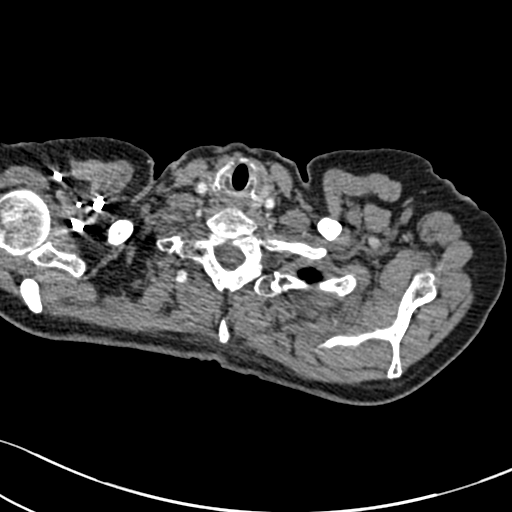

[Series 7: coronal mpr · coronal · 0.57mm/px · 2 of 78 slices shown]
[im 26/78  soft-tissue]
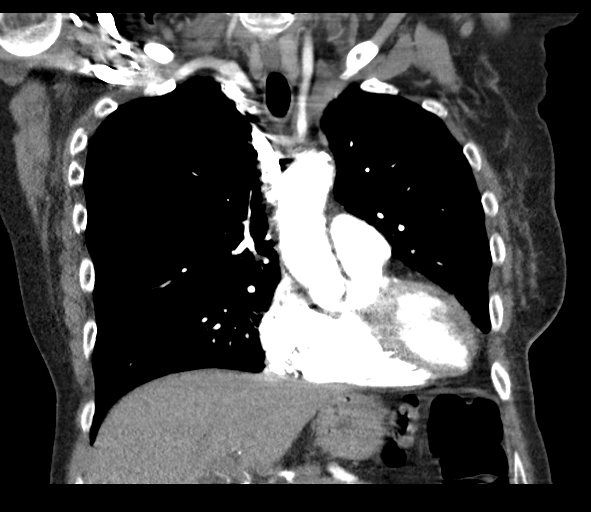
[im 52/78  soft-tissue]
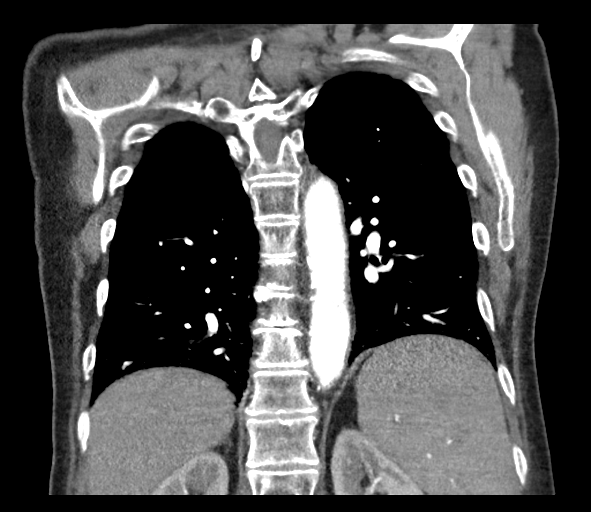

[18 of 46 positions shown; findings below may reference images not displayed]

FINDINGS: Cardiovascular: Satisfactory opacification of the pulmonary arteries
to the segmental level. No evidence of pulmonary embolism. Normal
heart size. No pericardial effusion. Aortic atherosclerosis.
Coronary artery calcifications.

Mediastinum/Nodes: No hilar or mediastinal adenopathy. Thyroid gland
and trachea are normal. Small chronic hiatal hernia.

Lungs/Pleura: Extensive severe emphysematous changes primarily in
the upper lobes, right worse than left. No infiltrates or effusions.
No significant mass lesions. The soft tissue fullness in the region
of the azygos vein on the prior study is felt to represent scarring.

Upper Abdomen: Negative.

Musculoskeletal: No chest wall abnormality. No acute or significant
osseous findings.

Review of the MIP images confirms the above findings.
IMPRESSION: 1. No pulmonary emboli or other acute abnormalities.
2. Extensive severe emphysematous changes in the upper lobes, right
worse than left.
3. Small chronic hiatal hernia.
4. The soft tissue fullness in the region of the azygos vein on the
prior study is felt to represent scarring.
5. Emphysema and aortic atherosclerosis.

Aortic Atherosclerosis (5YIX5-KUQ.Q) and Emphysema ([id number]-BYL.W).

## 2022-03-29 IMAGING — MG DIGITAL SCREENING BILAT W/ TOMO W/ CAD
8 series · 9 of 24 positions shown · non-contrast
Comparison: Previous exam(s).

CLINICAL DATA: Screening.

EXAM:
DIGITAL SCREENING BILATERAL MAMMOGRAM WITH TOMO AND CAD

[R MLO synth-2D]
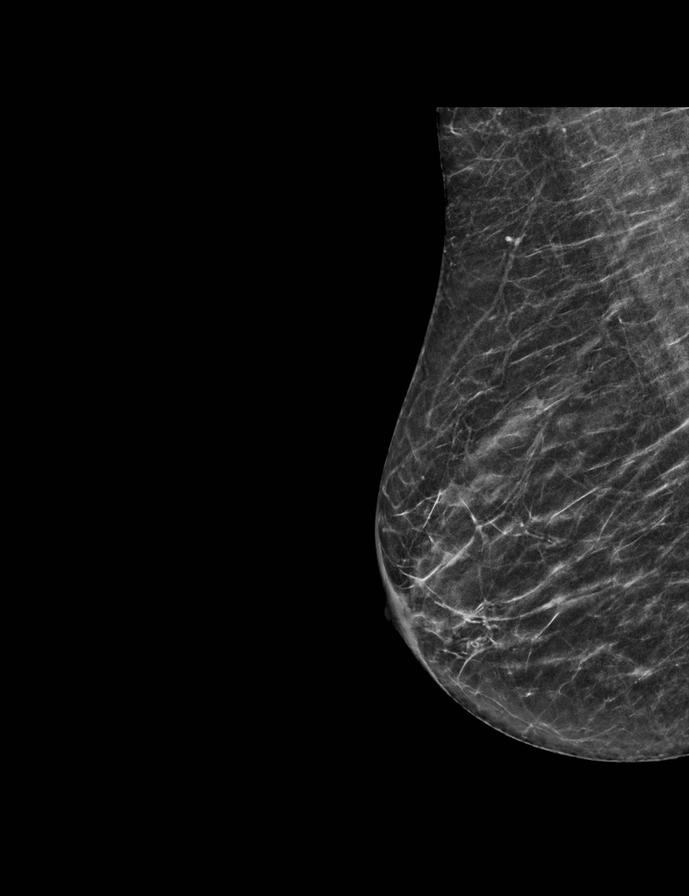

[R CC synth-2D]
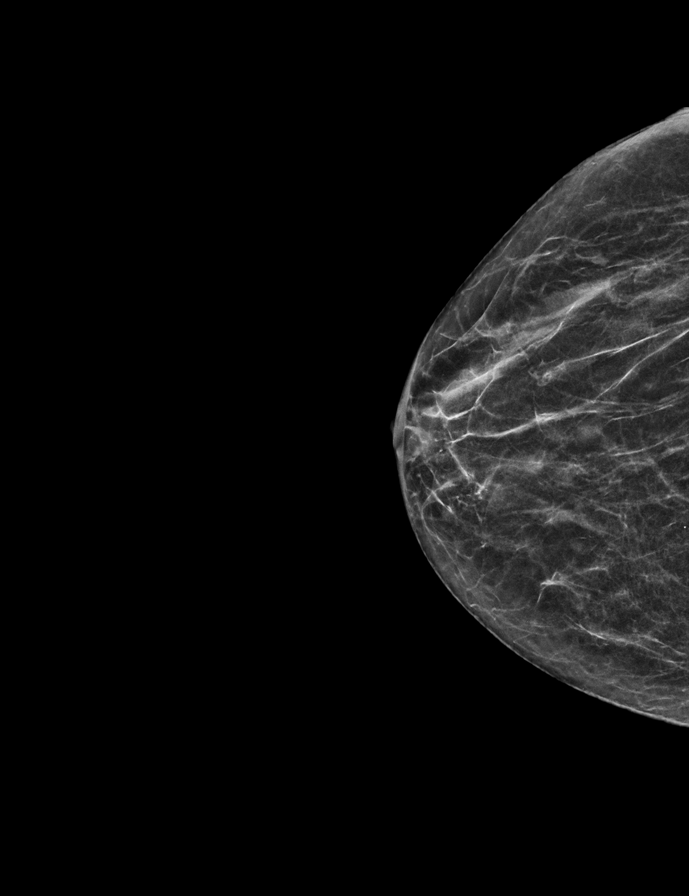

[L MLO synth-2D]
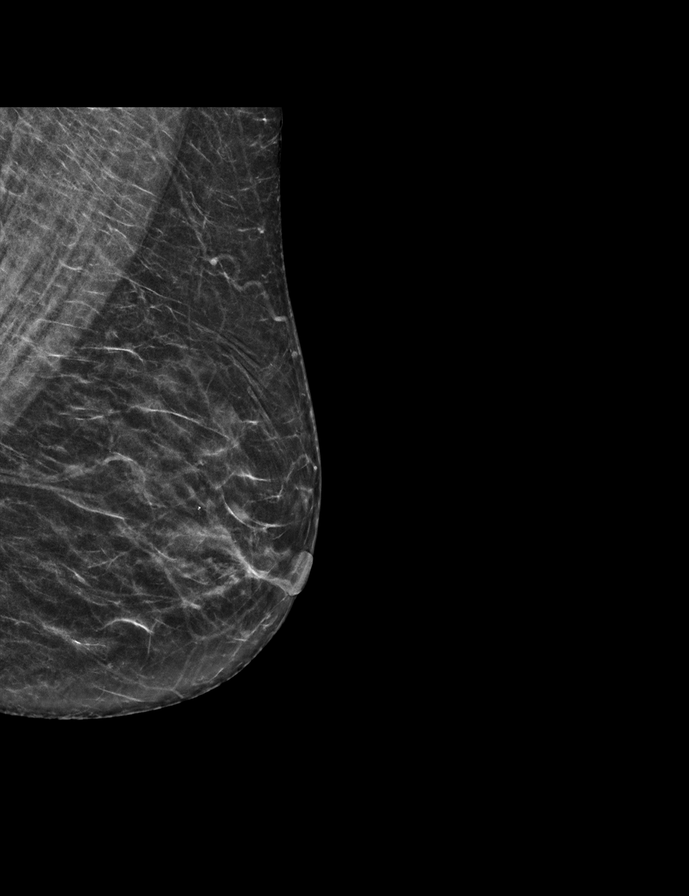

[L CC synth-2D]
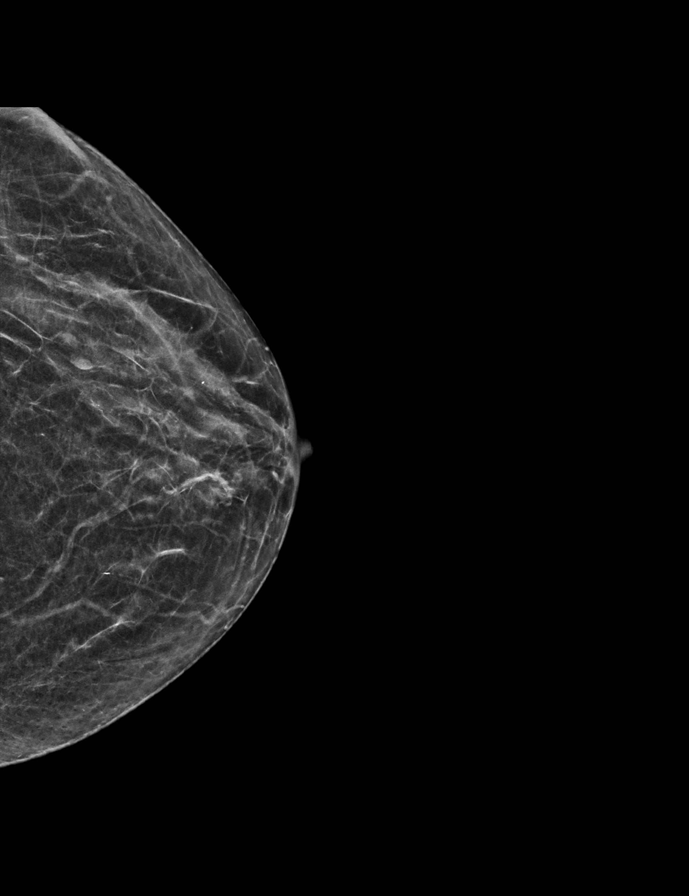

[L MLO tomo · 2 of 47 frames shown]
[frame 16/47]
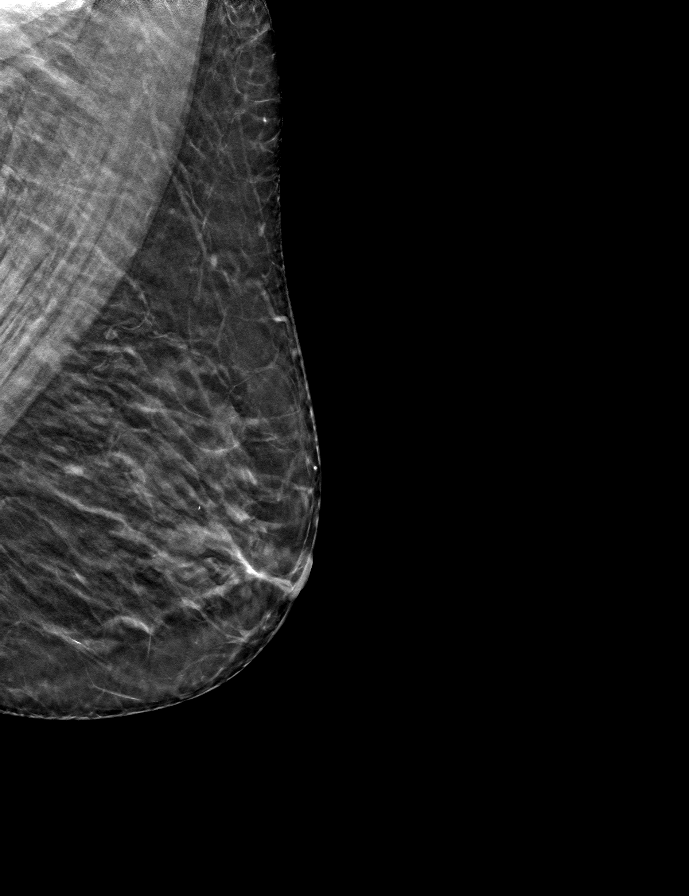
[frame 24/47]
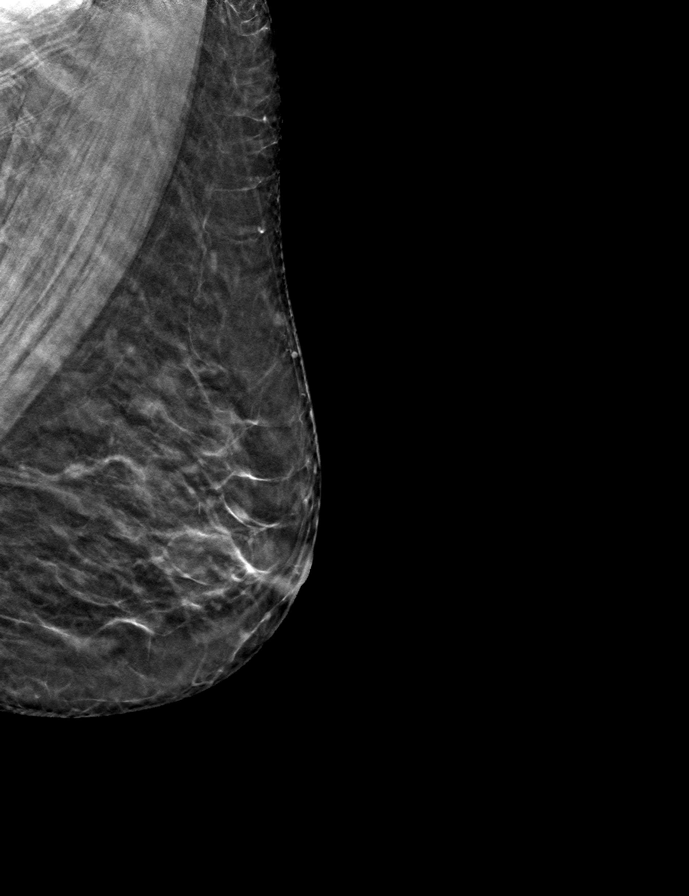

[R MLO tomo · tomo slice 23/44.0]
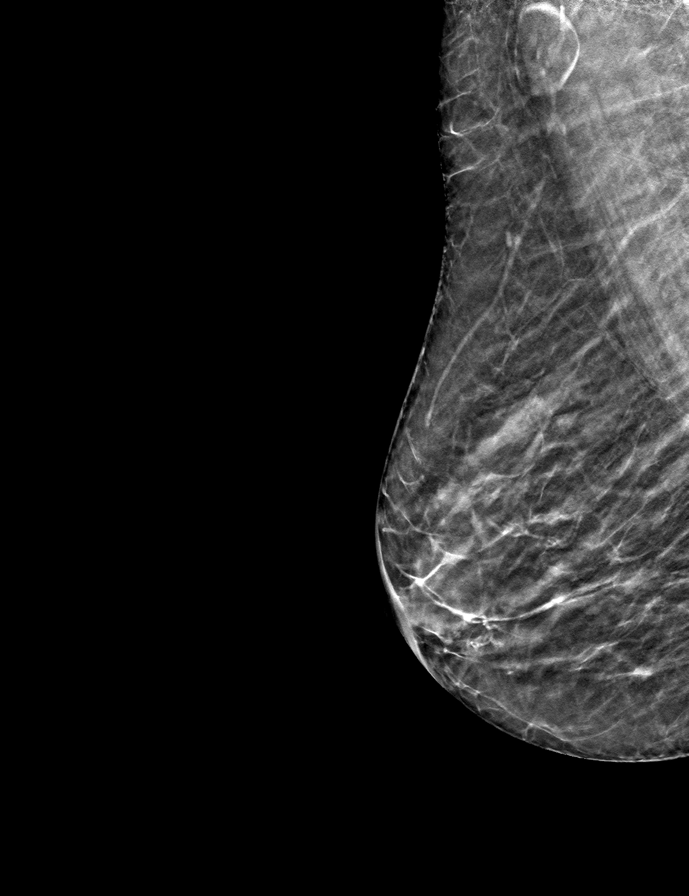

[L CC tomo · tomo slice 23/44.0]
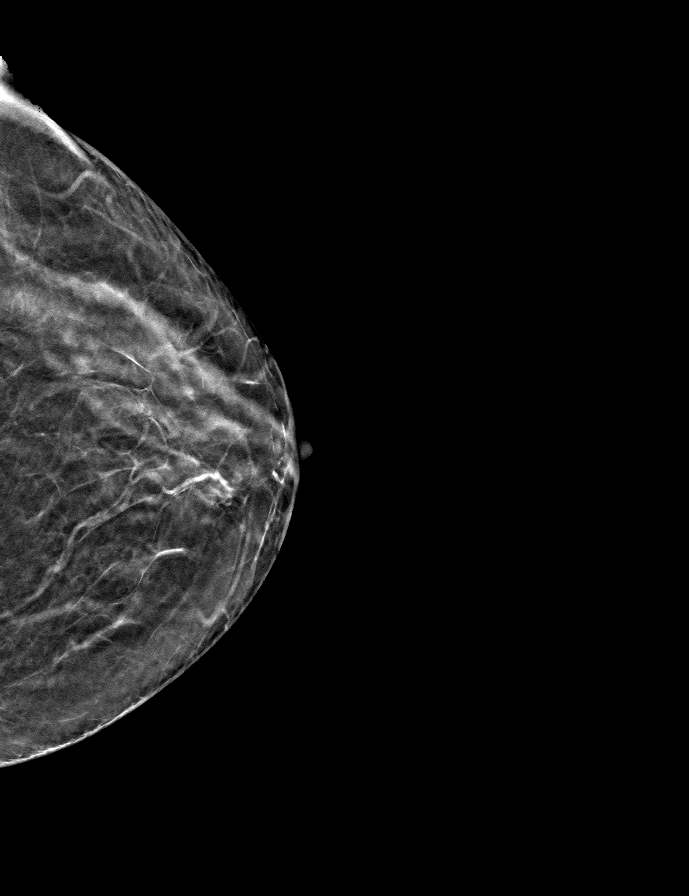

[R CC tomo · tomo slice 22/43.0]
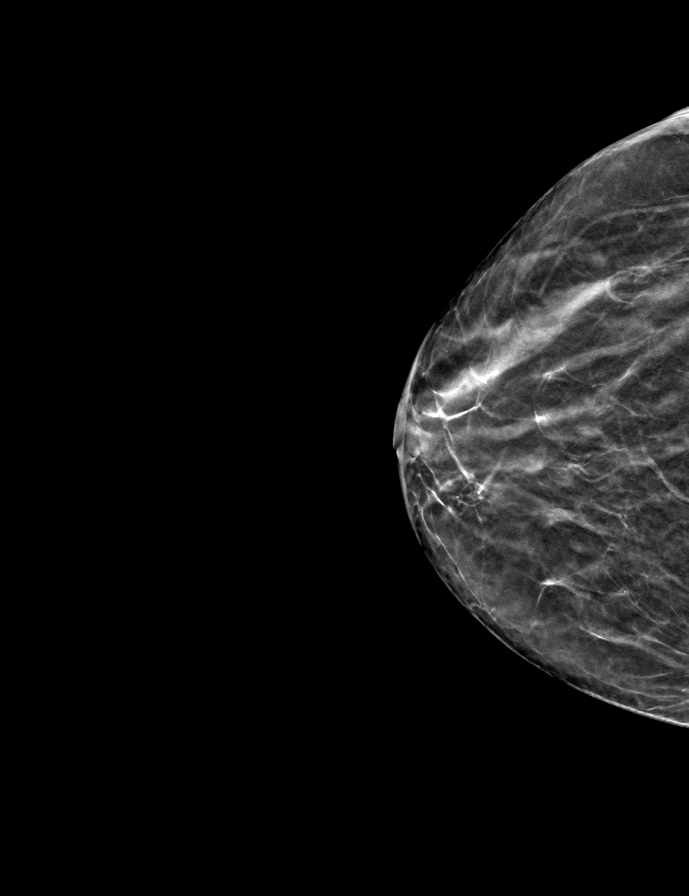

[9 of 24 positions shown; findings below may reference images not displayed]

ACR Breast Density Category c: The breast tissue is heterogeneously
dense, which may obscure small masses.
FINDINGS: There are no findings suspicious for malignancy. Images were
processed with CAD.
IMPRESSION: No mammographic evidence of malignancy. A result letter of this
screening mammogram will be mailed directly to the patient.

RECOMMENDATION:
Screening mammogram in one year. (Code:[id number]-[id number])

BI-RADS CATEGORY  1: Negative.

## 2022-04-01 ENCOUNTER — Telehealth: Payer: Self-pay | Admitting: Pulmonary Disease

## 2022-04-01 NOTE — Telephone Encounter (Signed)
Pt states she received a message on MyChart that she only has one year to live. States she had a CXR at some point. Ive clarified several times that there has not been a CXR done in our office since January 2023. Pt is very confused, concerned and was unable to answer any questions to help clarify what she was talking about.

## 2022-04-02 NOTE — Telephone Encounter (Signed)
Spoke with pt who stated she was told by someone that she 1 year to live. Pt was very confused and hard to follow at times. By end of conversation all pts concerns were addressed a far as life expectancy. Pt stated understanding. Nothing further needed at this time.

## 2022-04-04 ENCOUNTER — Telehealth: Payer: Self-pay

## 2022-04-04 DIAGNOSIS — Z471 Aftercare following joint replacement surgery: Secondary | ICD-10-CM | POA: Diagnosis not present

## 2022-04-04 DIAGNOSIS — M6281 Muscle weakness (generalized): Secondary | ICD-10-CM | POA: Diagnosis not present

## 2022-04-04 DIAGNOSIS — I959 Hypotension, unspecified: Secondary | ICD-10-CM | POA: Diagnosis not present

## 2022-04-04 DIAGNOSIS — J9601 Acute respiratory failure with hypoxia: Secondary | ICD-10-CM | POA: Diagnosis not present

## 2022-04-04 DIAGNOSIS — M25552 Pain in left hip: Secondary | ICD-10-CM | POA: Diagnosis not present

## 2022-04-04 DIAGNOSIS — Z96642 Presence of left artificial hip joint: Secondary | ICD-10-CM | POA: Diagnosis not present

## 2022-04-04 DIAGNOSIS — W19XXXA Unspecified fall, initial encounter: Secondary | ICD-10-CM | POA: Diagnosis not present

## 2022-04-04 DIAGNOSIS — J439 Emphysema, unspecified: Secondary | ICD-10-CM | POA: Diagnosis not present

## 2022-04-04 DIAGNOSIS — N390 Urinary tract infection, site not specified: Secondary | ICD-10-CM | POA: Diagnosis not present

## 2022-04-04 DIAGNOSIS — J449 Chronic obstructive pulmonary disease, unspecified: Secondary | ICD-10-CM | POA: Diagnosis not present

## 2022-04-04 DIAGNOSIS — R102 Pelvic and perineal pain: Secondary | ICD-10-CM | POA: Diagnosis not present

## 2022-04-04 DIAGNOSIS — F17210 Nicotine dependence, cigarettes, uncomplicated: Secondary | ICD-10-CM | POA: Diagnosis not present

## 2022-04-04 DIAGNOSIS — W19XXXD Unspecified fall, subsequent encounter: Secondary | ICD-10-CM | POA: Diagnosis not present

## 2022-04-04 DIAGNOSIS — Z020 Encounter for examination for admission to educational institution: Secondary | ICD-10-CM | POA: Diagnosis not present

## 2022-04-04 DIAGNOSIS — S72002A Fracture of unspecified part of neck of left femur, initial encounter for closed fracture: Secondary | ICD-10-CM | POA: Diagnosis not present

## 2022-04-04 DIAGNOSIS — S72012A Unspecified intracapsular fracture of left femur, initial encounter for closed fracture: Secondary | ICD-10-CM | POA: Diagnosis not present

## 2022-04-04 DIAGNOSIS — M169 Osteoarthritis of hip, unspecified: Secondary | ICD-10-CM | POA: Diagnosis not present

## 2022-04-04 DIAGNOSIS — E7800 Pure hypercholesterolemia, unspecified: Secondary | ICD-10-CM | POA: Diagnosis not present

## 2022-04-04 DIAGNOSIS — R293 Abnormal posture: Secondary | ICD-10-CM | POA: Diagnosis not present

## 2022-04-04 DIAGNOSIS — M545 Low back pain, unspecified: Secondary | ICD-10-CM | POA: Diagnosis not present

## 2022-04-04 DIAGNOSIS — S72002D Fracture of unspecified part of neck of left femur, subsequent encounter for closed fracture with routine healing: Secondary | ICD-10-CM | POA: Diagnosis not present

## 2022-04-04 DIAGNOSIS — I2510 Atherosclerotic heart disease of native coronary artery without angina pectoris: Secondary | ICD-10-CM | POA: Diagnosis not present

## 2022-04-04 DIAGNOSIS — D649 Anemia, unspecified: Secondary | ICD-10-CM | POA: Diagnosis not present

## 2022-04-04 DIAGNOSIS — R262 Difficulty in walking, not elsewhere classified: Secondary | ICD-10-CM | POA: Diagnosis not present

## 2022-04-04 DIAGNOSIS — D62 Acute posthemorrhagic anemia: Secondary | ICD-10-CM | POA: Diagnosis not present

## 2022-04-04 DIAGNOSIS — S72052A Unspecified fracture of head of left femur, initial encounter for closed fracture: Secondary | ICD-10-CM | POA: Diagnosis not present

## 2022-04-04 DIAGNOSIS — I10 Essential (primary) hypertension: Secondary | ICD-10-CM | POA: Diagnosis not present

## 2022-04-04 DIAGNOSIS — R41841 Cognitive communication deficit: Secondary | ICD-10-CM | POA: Diagnosis not present

## 2022-04-04 DIAGNOSIS — F419 Anxiety disorder, unspecified: Secondary | ICD-10-CM | POA: Diagnosis not present

## 2022-04-04 DIAGNOSIS — Z7401 Bed confinement status: Secondary | ICD-10-CM | POA: Diagnosis not present

## 2022-04-04 DIAGNOSIS — Z4789 Encounter for other orthopedic aftercare: Secondary | ICD-10-CM | POA: Diagnosis not present

## 2022-04-04 DIAGNOSIS — I110 Hypertensive heart disease with heart failure: Secondary | ICD-10-CM | POA: Diagnosis not present

## 2022-04-04 DIAGNOSIS — I509 Heart failure, unspecified: Secondary | ICD-10-CM | POA: Diagnosis not present

## 2022-04-04 DIAGNOSIS — M47816 Spondylosis without myelopathy or radiculopathy, lumbar region: Secondary | ICD-10-CM | POA: Diagnosis not present

## 2022-04-04 NOTE — Telephone Encounter (Signed)
        Patient  visited [facility] on 03/26/2022    Telephone encounter attempt :  1st  A HIPAA compliant voice message was left requesting a return call.  Instructed patient to call back    [place], [facility]  [phone] [address], [place], [place] [address] Phone: [phone] Email: [email or web address]

## 2022-04-05 ENCOUNTER — Telehealth: Payer: Self-pay

## 2022-04-05 DIAGNOSIS — S72012A Unspecified intracapsular fracture of left femur, initial encounter for closed fracture: Secondary | ICD-10-CM | POA: Diagnosis not present

## 2022-04-05 DIAGNOSIS — S72052A Unspecified fracture of head of left femur, initial encounter for closed fracture: Secondary | ICD-10-CM | POA: Diagnosis not present

## 2022-04-05 DIAGNOSIS — M169 Osteoarthritis of hip, unspecified: Secondary | ICD-10-CM | POA: Diagnosis not present

## 2022-04-05 DIAGNOSIS — I509 Heart failure, unspecified: Secondary | ICD-10-CM | POA: Diagnosis not present

## 2022-04-05 DIAGNOSIS — Z96642 Presence of left artificial hip joint: Secondary | ICD-10-CM | POA: Diagnosis not present

## 2022-04-05 DIAGNOSIS — Z471 Aftercare following joint replacement surgery: Secondary | ICD-10-CM | POA: Diagnosis not present

## 2022-04-05 DIAGNOSIS — I110 Hypertensive heart disease with heart failure: Secondary | ICD-10-CM | POA: Diagnosis not present

## 2022-04-05 NOTE — Telephone Encounter (Signed)
        Patient  visited [facility] on 10/24    Telephone encounter attempt :  2nd  A HIPAA compliant voice message was left requesting a return call.  Instructed patient to call back    [place], [facility]  [phone] [address], [place], [place] [address] Phone: [phone] Email: [email or web address]

## 2022-04-09 ENCOUNTER — Other Ambulatory Visit: Payer: Self-pay | Admitting: Cardiology

## 2022-04-12 ENCOUNTER — Ambulatory Visit: Payer: Medicare HMO | Admitting: Cardiology

## 2022-04-12 ENCOUNTER — Telehealth: Payer: Self-pay | Admitting: Cardiology

## 2022-04-12 DIAGNOSIS — Z4789 Encounter for other orthopedic aftercare: Secondary | ICD-10-CM | POA: Diagnosis not present

## 2022-04-12 DIAGNOSIS — R41841 Cognitive communication deficit: Secondary | ICD-10-CM | POA: Diagnosis not present

## 2022-04-12 DIAGNOSIS — Z020 Encounter for examination for admission to educational institution: Secondary | ICD-10-CM | POA: Diagnosis not present

## 2022-04-12 DIAGNOSIS — R6889 Other general symptoms and signs: Secondary | ICD-10-CM | POA: Diagnosis not present

## 2022-04-12 DIAGNOSIS — F411 Generalized anxiety disorder: Secondary | ICD-10-CM | POA: Diagnosis not present

## 2022-04-12 DIAGNOSIS — I959 Hypotension, unspecified: Secondary | ICD-10-CM | POA: Diagnosis not present

## 2022-04-12 DIAGNOSIS — F419 Anxiety disorder, unspecified: Secondary | ICD-10-CM | POA: Diagnosis not present

## 2022-04-12 DIAGNOSIS — W19XXXD Unspecified fall, subsequent encounter: Secondary | ICD-10-CM | POA: Diagnosis not present

## 2022-04-12 DIAGNOSIS — R300 Dysuria: Secondary | ICD-10-CM | POA: Diagnosis not present

## 2022-04-12 DIAGNOSIS — N390 Urinary tract infection, site not specified: Secondary | ICD-10-CM | POA: Diagnosis not present

## 2022-04-12 DIAGNOSIS — Z7401 Bed confinement status: Secondary | ICD-10-CM | POA: Diagnosis not present

## 2022-04-12 DIAGNOSIS — R293 Abnormal posture: Secondary | ICD-10-CM | POA: Diagnosis not present

## 2022-04-12 DIAGNOSIS — S72002D Fracture of unspecified part of neck of left femur, subsequent encounter for closed fracture with routine healing: Secondary | ICD-10-CM | POA: Diagnosis not present

## 2022-04-12 DIAGNOSIS — R262 Difficulty in walking, not elsewhere classified: Secondary | ICD-10-CM | POA: Diagnosis not present

## 2022-04-12 DIAGNOSIS — J9601 Acute respiratory failure with hypoxia: Secondary | ICD-10-CM | POA: Diagnosis not present

## 2022-04-12 DIAGNOSIS — J449 Chronic obstructive pulmonary disease, unspecified: Secondary | ICD-10-CM | POA: Diagnosis not present

## 2022-04-12 DIAGNOSIS — N3281 Overactive bladder: Secondary | ICD-10-CM | POA: Diagnosis not present

## 2022-04-12 DIAGNOSIS — D649 Anemia, unspecified: Secondary | ICD-10-CM | POA: Diagnosis not present

## 2022-04-12 DIAGNOSIS — M6281 Muscle weakness (generalized): Secondary | ICD-10-CM | POA: Diagnosis not present

## 2022-04-12 NOTE — Telephone Encounter (Signed)
[name] partner would like a callback regarding whether or not an order can be put in for pt to receive Physical Therapy being that she broke her hip. Please advise

## 2022-04-12 NOTE — Telephone Encounter (Signed)
Left message for Michele [name] to call her orthopedic or pcp for PT orders for broken hip.

## 2022-04-15 DIAGNOSIS — S72002D Fracture of unspecified part of neck of left femur, subsequent encounter for closed fracture with routine healing: Secondary | ICD-10-CM | POA: Diagnosis not present

## 2022-04-17 DIAGNOSIS — Z4789 Encounter for other orthopedic aftercare: Secondary | ICD-10-CM | POA: Diagnosis not present

## 2022-04-17 DIAGNOSIS — J449 Chronic obstructive pulmonary disease, unspecified: Secondary | ICD-10-CM | POA: Diagnosis not present

## 2022-04-17 DIAGNOSIS — S72002D Fracture of unspecified part of neck of left femur, subsequent encounter for closed fracture with routine healing: Secondary | ICD-10-CM | POA: Diagnosis not present

## 2022-04-17 DIAGNOSIS — R262 Difficulty in walking, not elsewhere classified: Secondary | ICD-10-CM | POA: Diagnosis not present

## 2022-04-18 DIAGNOSIS — R300 Dysuria: Secondary | ICD-10-CM | POA: Diagnosis not present

## 2022-04-19 DIAGNOSIS — F419 Anxiety disorder, unspecified: Secondary | ICD-10-CM | POA: Diagnosis not present

## 2022-04-22 DIAGNOSIS — R300 Dysuria: Secondary | ICD-10-CM | POA: Diagnosis not present

## 2022-04-29 ENCOUNTER — Other Ambulatory Visit: Payer: Self-pay | Admitting: Pharmacist

## 2022-04-29 MED ORDER — REPATHA SURECLICK 140 MG/ML ~~LOC~~ SOAJ: 140.0000 mg | SUBCUTANEOUS | 3 refills | Status: DC

## 2022-04-30 DIAGNOSIS — N3281 Overactive bladder: Secondary | ICD-10-CM | POA: Diagnosis not present

## 2022-04-30 DIAGNOSIS — F411 Generalized anxiety disorder: Secondary | ICD-10-CM | POA: Diagnosis not present

## 2022-05-02 DIAGNOSIS — Z4789 Encounter for other orthopedic aftercare: Secondary | ICD-10-CM | POA: Diagnosis not present

## 2022-05-02 DIAGNOSIS — S72002D Fracture of unspecified part of neck of left femur, subsequent encounter for closed fracture with routine healing: Secondary | ICD-10-CM | POA: Diagnosis not present

## 2022-05-02 DIAGNOSIS — F419 Anxiety disorder, unspecified: Secondary | ICD-10-CM | POA: Diagnosis not present

## 2022-05-02 DIAGNOSIS — J449 Chronic obstructive pulmonary disease, unspecified: Secondary | ICD-10-CM | POA: Diagnosis not present

## 2022-05-03 DIAGNOSIS — S72002D Fracture of unspecified part of neck of left femur, subsequent encounter for closed fracture with routine healing: Secondary | ICD-10-CM | POA: Diagnosis not present

## 2022-05-06 IMAGING — CT CT ABD-PELV W/ CM
1 of 3 series · 13 of 32 positions shown, 18 images · IV contrast (APPLIED)
Comparison: September 02, 2019

CLINICAL DATA: Abdominal pain, primarily right-sided

EXAM:
CT ABDOMEN AND PELVIS WITH CONTRAST
TECHNIQUE: Multidetector CT imaging of the abdomen and pelvis was performed
using the standard protocol following bolus administration of
intravenous contrast. Oral contrast was also administered.
CONTRAST:  100mL LK07AI-TPP IOPAMIDOL (LK07AI-TPP) INJECTION 61%

[Series 2: abd/pelvis w/cm · axial · 0.65mm/px · z∈[-402,-57]mm · 13 of 79 slices shown, 18 images]
[im 5/79  soft-tissue]
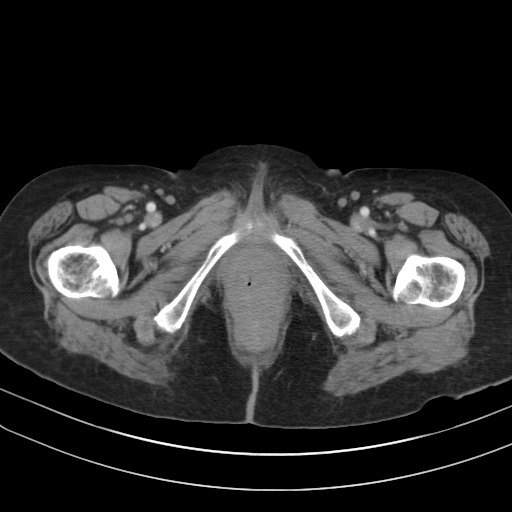
[im 5/79  bone]
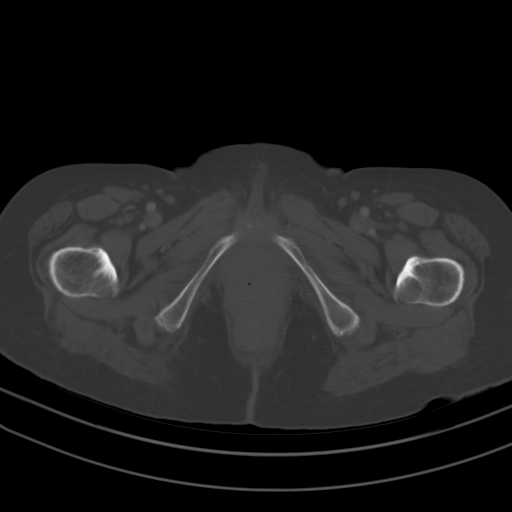
[im 10/79  soft-tissue]
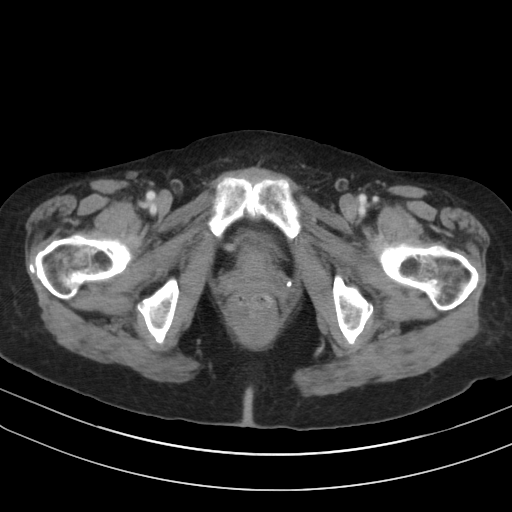
[im 20/79  soft-tissue]
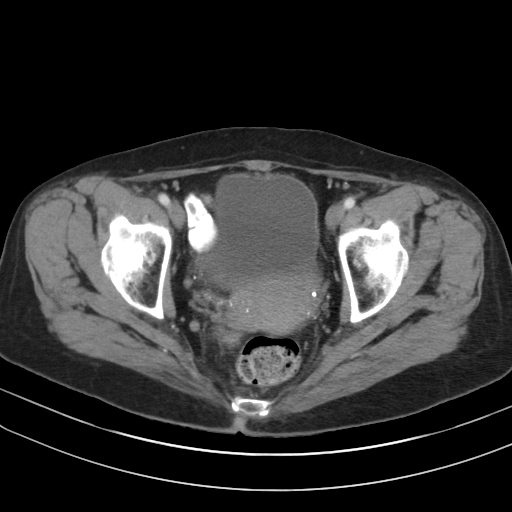
[im 25/79  soft-tissue]
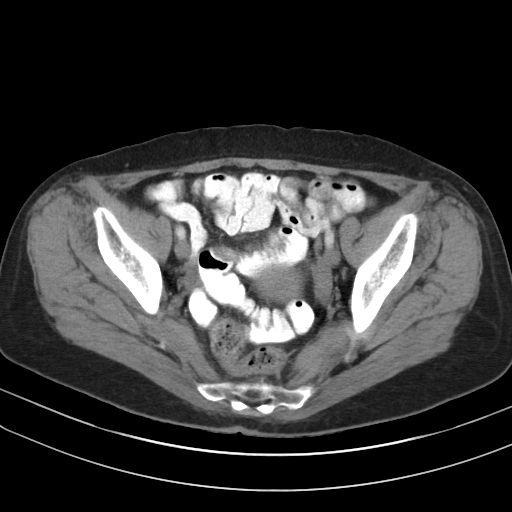
[im 30/79  soft-tissue]
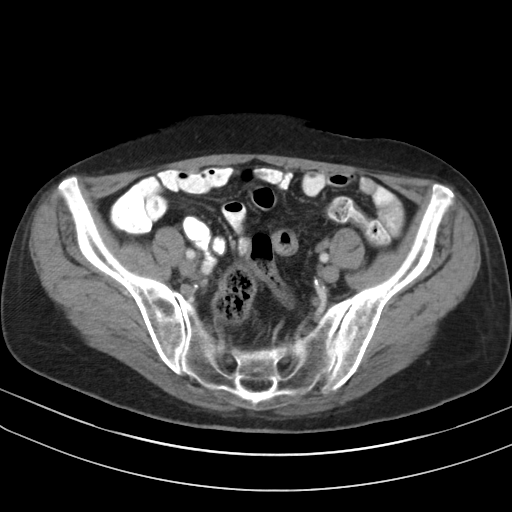
[im 35/79  soft-tissue]
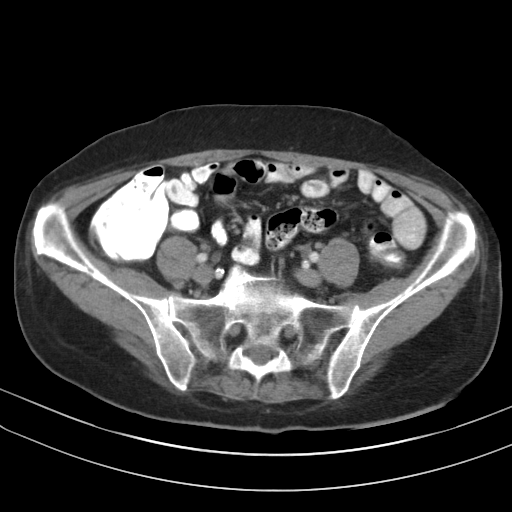
[im 44/79  soft-tissue]
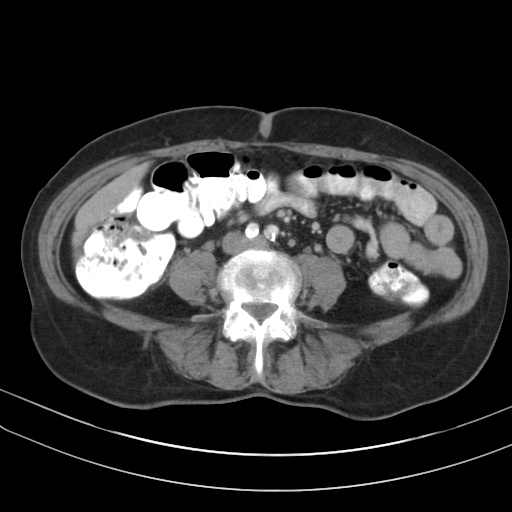
[im 49/79  soft-tissue]
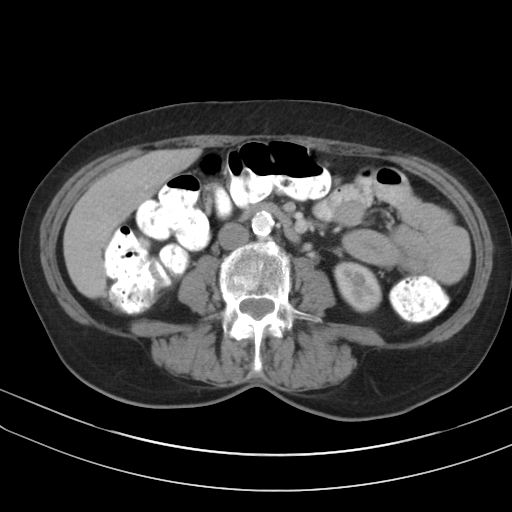
[im 54/79  soft-tissue]
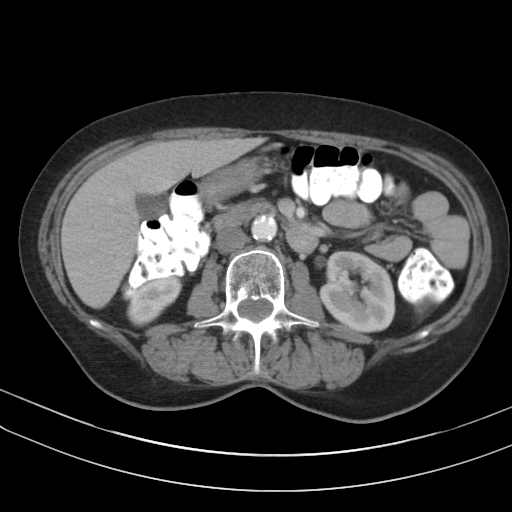
[im 54/79  bone]
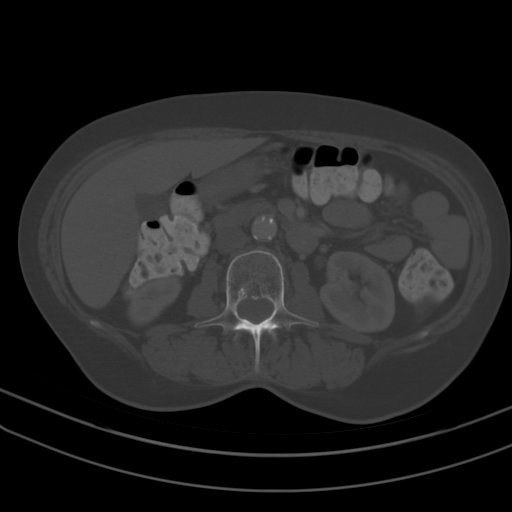
[im 59/79  soft-tissue]
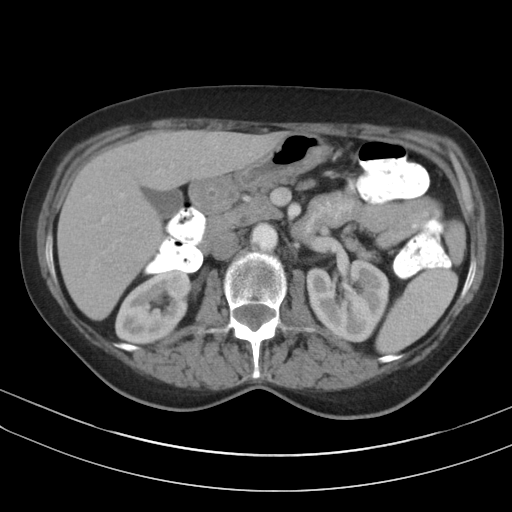
[im 59/79  lung]
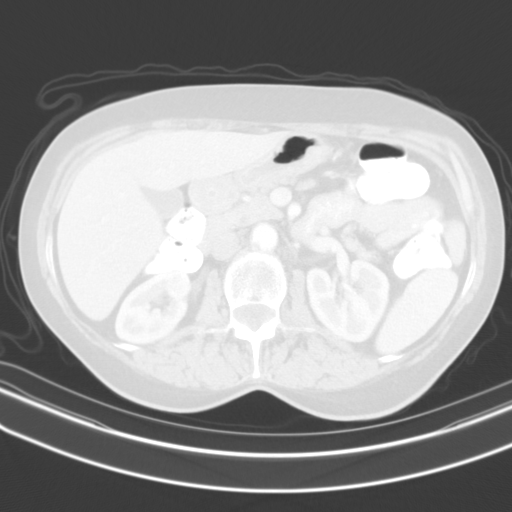
[im 64/79  lung]
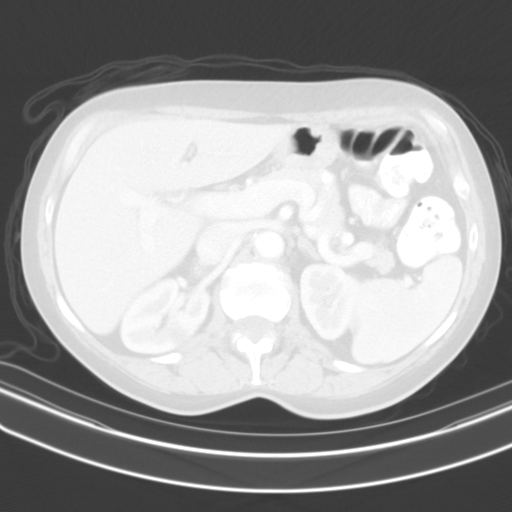
[im 69/79  soft-tissue]
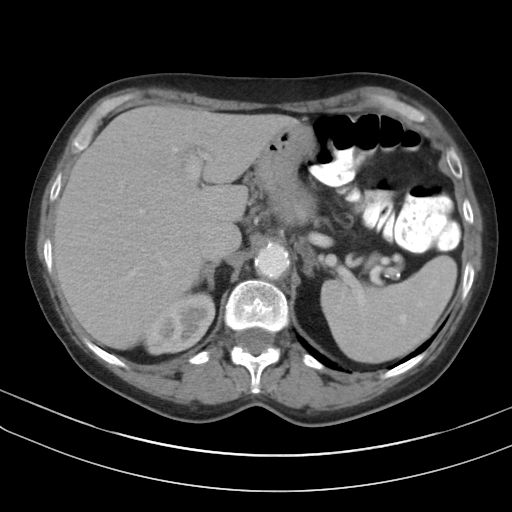
[im 69/79  lung]
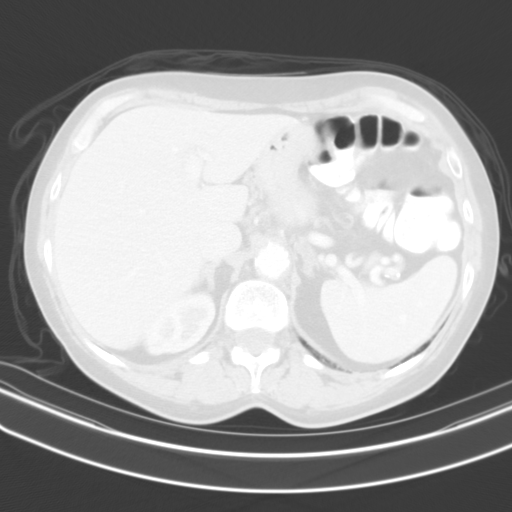
[im 74/79  soft-tissue]
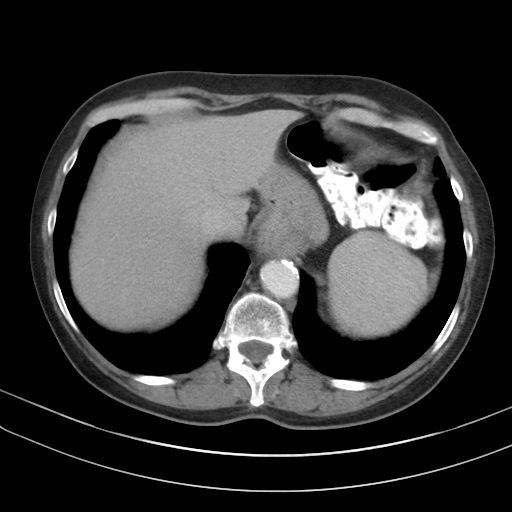
[im 74/79  lung]
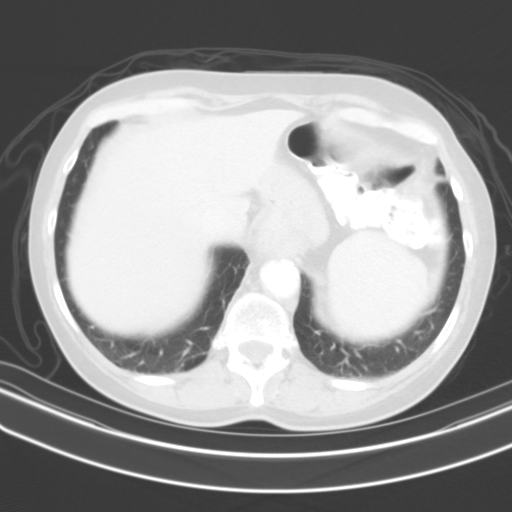

[13 of 32 positions shown; findings below may reference images not displayed]

FINDINGS: Lower chest: Emphysematous changes noted in the lung bases. There is
mild bibasilar scarring. No lung base edema or consolidation
evident. There is a focal hiatal hernia, stable.

Hepatobiliary: No focal liver lesions are appreciable. The
gallbladder wall is not appreciably thickened. There is no biliary
duct dilatation.

Pancreas: There is no pancreatic mass or inflammatory focus.

Spleen: No splenic lesions are evident.

Adrenals/Urinary Tract: Stable appearing adrenals without focal
adrenal lesion evident. There is a cyst arising from the posterior
upper pole of the right kidney measuring 1.6 x 1.2 cm. There is no
evident hydronephrosis on either side. There is no renal or ureteral
calculus on either side. Urinary bladder is midline with wall
thickness within normal limits.

Stomach/Bowel: There is no appreciable bowel wall or mesenteric
thickening. No bowel obstruction. The terminal ileum appears normal.
There is mild fatty infiltration in the ileocecal valve. There is no
free air or portal venous air.

Vascular/Lymphatic: There is no abdominal aortic aneurysm. There is
aortic and iliac artery atherosclerosis. There is no evident
adenopathy in the abdomen or pelvis.

Reproductive: The uterus is anteverted.  No evident pelvic mass.

Other: Appendix appears normal. No evident abscess or ascites in the
abdomen or pelvis.

Musculoskeletal: There is degenerative change in the lumbar spine
with vacuum phenomenon at L5-S1. There are no blastic or lytic bone
lesions. There is no intramuscular or abdominal wall lesion.
IMPRESSION: 1. No bowel wall thickening or bowel obstruction. No abscess in the
abdomen or pelvis. Appendix appears normal. No appreciable
diverticular disease.

2. There is fatty infiltration in the ileocecal valve. This finding
has been associated with intermittent right-sided abdominal pain.

3.  No renal or ureteral calculus.  No hydronephrosis.

4.  Focal hiatal hernia.

5. Aortic Atherosclerosis (06EBJ-U7Z.Z). There are iliac artery
atherosclerotic calcifications as well.

6.  Bibasilar emphysematous change.

## 2022-05-15 DIAGNOSIS — Z96642 Presence of left artificial hip joint: Secondary | ICD-10-CM | POA: Diagnosis not present

## 2022-05-15 DIAGNOSIS — Z09 Encounter for follow-up examination after completed treatment for conditions other than malignant neoplasm: Secondary | ICD-10-CM | POA: Diagnosis not present

## 2022-05-15 DIAGNOSIS — R2681 Unsteadiness on feet: Secondary | ICD-10-CM | POA: Diagnosis not present

## 2022-05-21 DIAGNOSIS — R6889 Other general symptoms and signs: Secondary | ICD-10-CM | POA: Diagnosis not present

## 2022-05-21 DIAGNOSIS — S72002A Fracture of unspecified part of neck of left femur, initial encounter for closed fracture: Secondary | ICD-10-CM | POA: Diagnosis not present

## 2022-05-29 ENCOUNTER — Other Ambulatory Visit: Payer: Self-pay | Admitting: Family Medicine

## 2022-05-29 DIAGNOSIS — Z1231 Encounter for screening mammogram for malignant neoplasm of breast: Secondary | ICD-10-CM

## 2022-05-30 DIAGNOSIS — R300 Dysuria: Secondary | ICD-10-CM | POA: Diagnosis not present

## 2022-06-07 ENCOUNTER — Other Ambulatory Visit: Payer: Self-pay | Admitting: Primary Care

## 2022-06-10 ENCOUNTER — Telehealth: Payer: Self-pay | Admitting: Pulmonary Disease

## 2022-06-10 ENCOUNTER — Telehealth: Payer: Self-pay | Admitting: Cardiology

## 2022-06-10 DIAGNOSIS — R300 Dysuria: Secondary | ICD-10-CM | POA: Diagnosis not present

## 2022-06-10 MED ORDER — LEVALBUTEROL TARTRATE 45 MCG/ACT IN AERO: 2.0000 | INHALATION_SPRAY | Freq: Four times a day (QID) | RESPIRATORY_TRACT | 12 refills | Status: DC | PRN

## 2022-06-10 MED ORDER — ALBUTEROL SULFATE HFA 108 (90 BASE) MCG/ACT IN AERS: 2.0000 | INHALATION_SPRAY | RESPIRATORY_TRACT | 2 refills | Status: DC | PRN

## 2022-06-10 NOTE — Telephone Encounter (Addendum)
Patient called stating she called her ortho doctor, who advised her he didn't think her feet turning blue had anything to do with her hip.  He believes it has to do with something vascular.

## 2022-06-10 NOTE — Telephone Encounter (Signed)
Rx for levalbuterol hfa  has been sent to preferred pharmacy for pt. Called and spoke with pt letting her know this had been done and she verbalized understanding. Nothing further needed.

## 2022-06-10 NOTE — Telephone Encounter (Signed)
   Pt said, he calf and feet feels cold to touch. She made an appt with APP on Thursday but she wants to know if she needs to do anything before her appt

## 2022-06-10 NOTE — Telephone Encounter (Signed)
Left patient message that we will evaluate in more detail on Thursday, 1/11 @ 11:20a.

## 2022-06-10 NOTE — Telephone Encounter (Signed)
Called and spoke with patient who states that both of her feet are "dark blue and cold to the touch". Patient also reports that these are painful as well. Patient states that the left foot is slightly worse than the right and her toes are puffy. Educated patient and patients caregiver on how to check pedal pulses and they report that the pulses are "real heavy pulses". Patient reports that she does have feeling in her feet and report that they are not tingly. Patient does state that she has recently had a hip replacement in November of 2023. Patient denies any swelling of the calves, and reports that her feet have been kept in a warm area and she is wearing socks but states they are still cold. Advised patient to call her ortho doctor as well as her primary care physician. Patient would also like to see if Dr. [name] has any recommendations as well. Advised patient I would forward message over.

## 2022-06-10 NOTE — Telephone Encounter (Signed)
Pt called wondering why her script was denied at [facility]. Inhaler that replaces the Albuterol. Albuterol makes her heart race.  Pharm: [facility] @ [place]  Please call to advise.

## 2022-06-13 ENCOUNTER — Ambulatory Visit: Payer: Medicare HMO | Attending: Nurse Practitioner | Admitting: Nurse Practitioner

## 2022-06-13 ENCOUNTER — Encounter: Payer: Self-pay | Admitting: Nurse Practitioner

## 2022-06-13 VITALS — BP 110/68 | HR 83 | Ht 66.0 in | Wt 135.2 lb

## 2022-06-13 DIAGNOSIS — J439 Emphysema, unspecified: Secondary | ICD-10-CM

## 2022-06-13 DIAGNOSIS — I700 Atherosclerosis of aorta: Secondary | ICD-10-CM | POA: Diagnosis not present

## 2022-06-13 DIAGNOSIS — R6889 Other general symptoms and signs: Secondary | ICD-10-CM | POA: Diagnosis not present

## 2022-06-13 DIAGNOSIS — I25118 Atherosclerotic heart disease of native coronary artery with other forms of angina pectoris: Secondary | ICD-10-CM | POA: Diagnosis not present

## 2022-06-13 DIAGNOSIS — M79605 Pain in left leg: Secondary | ICD-10-CM

## 2022-06-13 DIAGNOSIS — I771 Stricture of artery: Secondary | ICD-10-CM

## 2022-06-13 DIAGNOSIS — E785 Hyperlipidemia, unspecified: Secondary | ICD-10-CM | POA: Diagnosis not present

## 2022-06-13 DIAGNOSIS — I10 Essential (primary) hypertension: Secondary | ICD-10-CM | POA: Diagnosis not present

## 2022-06-13 DIAGNOSIS — M79604 Pain in right leg: Secondary | ICD-10-CM | POA: Diagnosis not present

## 2022-06-13 DIAGNOSIS — I5032 Chronic diastolic (congestive) heart failure: Secondary | ICD-10-CM

## 2022-06-13 LAB — LIPID PANEL

## 2022-06-13 NOTE — Patient Instructions (Addendum)
Medication Instructions:  Your physician recommends that you continue on your current medications as directed. Please refer to the Current Medication list given to you today.   *If you need a refill on your cardiac medications before your next appointment, please call your pharmacy*   Lab Work: Your physician recommends that you complete lab work today CBC, CMET, BNP, Fasting Lipid panel  If you have labs (blood work) drawn today and your tests are completely normal, you will receive your results only by: MyChart Message (if you have MyChart) OR A paper copy in the mail If you have any lab test that is abnormal or we need to change your treatment, we will call you to review the results.   Testing/Procedures: Your physician has requested that you have an echocardiogram. Echocardiography is a painless test that uses sound waves to create images of your heart. It provides your doctor with information about the size and shape of your heart and how well your heart's chambers and valves are working. This procedure takes approximately one hour. There are no restrictions for this procedure. ABIs VAS US Aorta/IVC/ILIAC VAS Lower Extremity Venous Reflux Please do NOT wear cologne, perfume, aftershave, or lotions (deodorant is allowed). Please arrive 15 minutes prior to your appointment time.    Follow-Up: At [facility], you and your health needs are our priority.  As part of our continuing mission to provide you with exceptional heart care, we have created designated Provider Care Teams.  These Care Teams include your primary Cardiologist (physician) and Advanced Practice Providers (APPs -  Physician Assistants and Nurse Practitioners) who all work together to provide you with the care you need, when you need it.  We recommend signing up for the patient portal called "MyChart".  Sign up information is provided on this After Visit Summary.  MyChart is used to connect with patients for  Virtual Visits (Telemedicine).  Patients are able to view lab/test results, encounter notes, upcoming appointments, etc.  Non-urgent messages can be sent to your provider as well.   To learn more about what you can do with MyChart, go to [email or web address].    Your next appointment:    Keep follow up  Provider:   Glenetta Hew, MD     Other Instructions
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
## 2022-06-14 LAB — COMPREHENSIVE METABOLIC PANEL
ALT: 7 IU/L (ref 0–32)
AST: 13 IU/L (ref 0–40)
Albumin/Globulin Ratio: 2.9 — ABNORMAL HIGH (ref 1.2–2.2)
Albumin: 4.4 g/dL (ref 3.9–4.9)
Alkaline Phosphatase: 80 IU/L (ref 44–121)
BUN/Creatinine Ratio: 18 (ref 12–28)
BUN: 11 mg/dL (ref 8–27)
Bilirubin Total: 0.2 mg/dL (ref 0.0–1.2)
CO2: 22 mmol/L (ref 20–29)
Calcium: 9.6 mg/dL (ref 8.7–10.3)
Chloride: 105 mmol/L (ref 96–106)
Creatinine, Ser: 0.61 mg/dL (ref 0.57–1.00)
Globulin, Total: 1.5 g/dL (ref 1.5–4.5)
Glucose: 91 mg/dL (ref 70–99)
Potassium: 4.7 mmol/L (ref 3.5–5.2)
Sodium: 143 mmol/L (ref 134–144)
Total Protein: 5.9 g/dL — ABNORMAL LOW (ref 6.0–8.5)
eGFR: 97 mL/min/{1.73_m2} (ref >59)

## 2022-06-14 LAB — CBC
Hematocrit: 42.4 % (ref 34.0–46.6)
Hemoglobin: 13.6 g/dL (ref 11.1–15.9)
MCH: 28.9 pg (ref 26.6–33.0)
MCHC: 32.1 g/dL (ref 31.5–35.7)
MCV: 90 fL (ref 79–97)
Platelets: 264 10*3/uL (ref 150–450)
RBC: 4.71 x10E6/uL (ref 3.77–5.28)
RDW: 13.1 % (ref 11.7–15.4)
WBC: 4.8 10*3/uL (ref 3.4–10.8)

## 2022-06-14 LAB — LIPID PANEL
Chol/HDL Ratio: 2.3 ratio (ref 0.0–4.4)
Cholesterol, Total: 102 mg/dL (ref 100–199)
HDL: 44 mg/dL (ref >39)
LDL Chol Calc (NIH): 39 mg/dL (ref 0–99)
Triglycerides: 100 mg/dL (ref 0–149)
VLDL Cholesterol Cal: 19 mg/dL (ref 5–40)

## 2022-06-14 LAB — BRAIN NATRIURETIC PEPTIDE: BNP: 42.4 pg/mL (ref 0.0–100.0)

## 2022-06-17 ENCOUNTER — Telehealth: Payer: Self-pay | Admitting: Cardiology

## 2022-06-17 NOTE — Telephone Encounter (Signed)
Sent via mychart

## 2022-06-17 NOTE — Telephone Encounter (Signed)
Pt c/o medication issue:  1. Name of Medication:  Fosfomycin  2. How are you currently taking this medication (dosage and times per day)?   3. Are you having a reaction (difficulty breathing--STAT)?   4. What is your medication issue?   Patient states she has questions regarding this medication and her upcoming tests. She would like to discuss with Dr. Elissa Hefty nurse as soon as possible.

## 2022-06-17 NOTE — Telephone Encounter (Signed)
I contacted patient- she has upcoming [place] scans tomorrow and 01/26. She has been prescribed Fosfomycin for a UTI (she has been taking the three dosages, last dose is tomorrow) she wanted to make sure this was okay with her upcoming scans. Advised that it should be fine, it should not effect the scans.    Patient also request her blood work results, advised I did not have them released yet with recommendations, but I would check with NP and return call.   Patient verbalized understanding.

## 2022-06-18 ENCOUNTER — Ambulatory Visit (HOSPITAL_COMMUNITY)
Admission: RE | Admit: 2022-06-18 | Discharge: 2022-06-18 | Disposition: A | Discharge status: Home / Self Care | Payer: Medicare HMO | Source: Ambulatory Visit | Attending: Nurse Practitioner | Admitting: Nurse Practitioner

## 2022-06-18 ENCOUNTER — Telehealth: Payer: Self-pay

## 2022-06-18 DIAGNOSIS — J439 Emphysema, unspecified: Secondary | ICD-10-CM

## 2022-06-18 DIAGNOSIS — I25118 Atherosclerotic heart disease of native coronary artery with other forms of angina pectoris: Secondary | ICD-10-CM

## 2022-06-18 DIAGNOSIS — I5032 Chronic diastolic (congestive) heart failure: Secondary | ICD-10-CM

## 2022-06-18 DIAGNOSIS — E785 Hyperlipidemia, unspecified: Secondary | ICD-10-CM | POA: Diagnosis not present

## 2022-06-18 DIAGNOSIS — I700 Atherosclerosis of aorta: Secondary | ICD-10-CM | POA: Insufficient documentation

## 2022-06-18 DIAGNOSIS — I771 Stricture of artery: Secondary | ICD-10-CM

## 2022-06-18 DIAGNOSIS — R600 Localized edema: Secondary | ICD-10-CM | POA: Insufficient documentation

## 2022-06-18 DIAGNOSIS — I10 Essential (primary) hypertension: Secondary | ICD-10-CM | POA: Diagnosis not present

## 2022-06-18 NOTE — Telephone Encounter (Signed)
Spoke with pt. Lab results discussed with pt. Pt is aware that after she completes her ABIs and vascular test, she will be notified of results.

## 2022-06-21 ENCOUNTER — Other Ambulatory Visit (HOSPITAL_COMMUNITY): Payer: Self-pay

## 2022-06-27 DIAGNOSIS — N3020 Other chronic cystitis without hematuria: Secondary | ICD-10-CM | POA: Diagnosis not present

## 2022-06-27 DIAGNOSIS — N3946 Mixed incontinence: Secondary | ICD-10-CM | POA: Diagnosis not present

## 2022-06-27 DIAGNOSIS — N3281 Overactive bladder: Secondary | ICD-10-CM | POA: Diagnosis not present

## 2022-06-28 ENCOUNTER — Ambulatory Visit (HOSPITAL_COMMUNITY)
Admission: RE | Admit: 2022-06-28 | Discharge: 2022-06-28 | Disposition: A | Discharge status: Home / Self Care | Payer: Medicare HMO | Source: Ambulatory Visit | Attending: Cardiovascular Disease | Admitting: Cardiovascular Disease

## 2022-06-28 ENCOUNTER — Encounter (HOSPITAL_COMMUNITY): Payer: Medicare HMO

## 2022-06-28 ENCOUNTER — Ambulatory Visit (HOSPITAL_BASED_OUTPATIENT_CLINIC_OR_DEPARTMENT_OTHER)
Admission: RE | Admit: 2022-06-28 | Discharge: 2022-06-28 | Disposition: A | Discharge status: Home / Self Care | Payer: Medicare HMO | Source: Ambulatory Visit | Attending: Nurse Practitioner | Admitting: Nurse Practitioner

## 2022-06-28 DIAGNOSIS — I25118 Atherosclerotic heart disease of native coronary artery with other forms of angina pectoris: Secondary | ICD-10-CM

## 2022-06-28 DIAGNOSIS — E785 Hyperlipidemia, unspecified: Secondary | ICD-10-CM | POA: Diagnosis not present

## 2022-06-28 DIAGNOSIS — I10 Essential (primary) hypertension: Secondary | ICD-10-CM

## 2022-06-28 DIAGNOSIS — J439 Emphysema, unspecified: Secondary | ICD-10-CM

## 2022-06-28 DIAGNOSIS — I700 Atherosclerosis of aorta: Secondary | ICD-10-CM | POA: Diagnosis not present

## 2022-06-28 DIAGNOSIS — I5032 Chronic diastolic (congestive) heart failure: Secondary | ICD-10-CM

## 2022-06-28 DIAGNOSIS — I771 Stricture of artery: Secondary | ICD-10-CM | POA: Diagnosis not present

## 2022-06-28 LAB — VAS US ABI WITH/WO TBI
Left ABI: 1.2
Right ABI: 1.17

## 2022-07-02 ENCOUNTER — Telehealth: Payer: Self-pay

## 2022-07-02 DIAGNOSIS — H903 Sensorineural hearing loss, bilateral: Secondary | ICD-10-CM | POA: Diagnosis not present

## 2022-07-02 NOTE — Telephone Encounter (Signed)
Spoke with pt. Pt was notified of results. Pt will continue her current medication and follow up as planned. Compression and elevation is also recommended. Pt is aware and voiced understanding.

## 2022-07-02 NOTE — Telephone Encounter (Signed)
-----  Message from Lenna Sciara, NP sent at 06/28/2022  2:04 PM EST ----- Recent ABIs were normal, no evidence of lack of blood flow to the lower extremities.  This is good news.  Continue current medications and follow-up as planned.  Thank you.-EM

## 2022-07-08 DIAGNOSIS — S72002A Fracture of unspecified part of neck of left femur, initial encounter for closed fracture: Secondary | ICD-10-CM | POA: Diagnosis not present

## 2022-07-10 ENCOUNTER — Ambulatory Visit (HOSPITAL_COMMUNITY): Payer: Medicare HMO | Attending: Cardiovascular Disease

## 2022-07-10 DIAGNOSIS — J439 Emphysema, unspecified: Secondary | ICD-10-CM | POA: Insufficient documentation

## 2022-07-10 DIAGNOSIS — I25118 Atherosclerotic heart disease of native coronary artery with other forms of angina pectoris: Secondary | ICD-10-CM | POA: Diagnosis not present

## 2022-07-10 DIAGNOSIS — I700 Atherosclerosis of aorta: Secondary | ICD-10-CM | POA: Diagnosis not present

## 2022-07-10 DIAGNOSIS — I10 Essential (primary) hypertension: Secondary | ICD-10-CM | POA: Insufficient documentation

## 2022-07-10 DIAGNOSIS — M79604 Pain in right leg: Secondary | ICD-10-CM | POA: Diagnosis not present

## 2022-07-10 DIAGNOSIS — E785 Hyperlipidemia, unspecified: Secondary | ICD-10-CM | POA: Diagnosis not present

## 2022-07-10 DIAGNOSIS — I771 Stricture of artery: Secondary | ICD-10-CM | POA: Insufficient documentation

## 2022-07-10 DIAGNOSIS — M79605 Pain in left leg: Secondary | ICD-10-CM | POA: Diagnosis not present

## 2022-07-10 DIAGNOSIS — I5032 Chronic diastolic (congestive) heart failure: Secondary | ICD-10-CM | POA: Insufficient documentation

## 2022-07-10 LAB — ECHOCARDIOGRAM COMPLETE
Area-P 1/2: 3.08 cm2
S' Lateral: 1.8 cm

## 2022-07-15 ENCOUNTER — Other Ambulatory Visit: Payer: Self-pay

## 2022-07-15 ENCOUNTER — Telehealth: Payer: Self-pay

## 2022-07-15 ENCOUNTER — Telehealth: Payer: Self-pay | Admitting: Cardiology

## 2022-07-15 ENCOUNTER — Other Ambulatory Visit: Payer: Self-pay | Admitting: Physician Assistant

## 2022-07-15 DIAGNOSIS — R0609 Other forms of dyspnea: Secondary | ICD-10-CM

## 2022-07-15 DIAGNOSIS — I5189 Other ill-defined heart diseases: Secondary | ICD-10-CM

## 2022-07-15 MED ORDER — FUROSEMIDE 20 MG PO TABS: ORAL_TABLET | ORAL | 3 refills | Status: DC

## 2022-07-15 NOTE — Telephone Encounter (Signed)
Patient is requesting to speak directly with the nurse she spoke with this morning to schedule. She states she was offered a sooner appointment time. She states she is unable to make it on 2/16 because she has another appointment.

## 2022-07-15 NOTE — Telephone Encounter (Signed)
Patient called to get test results.

## 2022-07-15 NOTE — Telephone Encounter (Signed)
Called patient, she states that she was told by NP to take Lasix 20 mg daily due to her swelling- she states she is using the restroom to much and wants to decrease her dosage- she is taking 20 mg Lasix at this time, has upcoming appointment on 2/19- advised to continue with this until otherwise told different. Patient aware Lasix is causing increase urination- she would like to cut the pills in half, she request I check with Dr.Harding.   Will route to MD. Patient swelling is improved- only swelling  located at this time is the leg that she had hip surgery completed on  Thanks!

## 2022-07-15 NOTE — Telephone Encounter (Signed)
Pt stated she cannot make 2/16 appointment due to conflict with another apt. Scheduled for 2/19 with J. Cleaver.

## 2022-07-15 NOTE — Telephone Encounter (Signed)
Pt c/o medication issue:  1. Name of Medication:   furosemide (LASIX) 20 MG tablet   2. How are you currently taking this medication (dosage and times per day)?   Just started  3. Are you having a reaction (difficulty breathing--STAT)?   No  4. What is your medication issue?   Patient stated she is having to go to the bathroom too often.

## 2022-07-15 NOTE — Telephone Encounter (Signed)
Spoke with pt. Pt was notified of ECHO results. Pt will continue er current medication and follow up as planned.

## 2022-07-15 NOTE — Telephone Encounter (Addendum)
Pt wanted to know Echo results (not resulted yet). She wanted to review venous reflux, Vas [place] or AAA, and ABI. Results given to her. She stated she has swelling in both legs. She stated the stocking make her legs worse and elevating does not help. Recommended she get measured for stockings .She does not weigh daily, and is not taking Lasix. Recommended to take lasix 20mg  daily as prescribed. She stated she didn't know she had heart failure. Education on daily wt discussed with pt. She wants to discuss results and HF. Made appointment with Vella Raring for 2/16. While on phone, BP 188/79, P 85. E. Monge advised.

## 2022-07-15 NOTE — Telephone Encounter (Signed)
Pateint informed to take lasix 20mg  daily pas prescribed and to keep BP log to take to 2/16 appointment. She stated she thinks BP cuff is wrong. Recommended she take cuff to appointment for comparison.

## 2022-07-16 NOTE — Telephone Encounter (Signed)
As long as she is not having worsening swelling or shortness of breath, that is fine to cut the dose in half.  If she has more swelling, she should take a full pill.   Glenetta Hew, MD

## 2022-07-17 NOTE — Telephone Encounter (Signed)
Called patient advised of message from MD.   Patient continues to have some swelling, so she will continue the 20 mg tablet.  Aware if things improve she can cut in half.   Patient verbalized understanding

## 2022-07-19 ENCOUNTER — Ambulatory Visit
Admission: RE | Admit: 2022-07-19 | Discharge: 2022-07-19 | Disposition: A | Discharge status: Home / Self Care | Payer: Medicare HMO | Source: Ambulatory Visit | Attending: Family Medicine | Admitting: Family Medicine

## 2022-07-19 ENCOUNTER — Ambulatory Visit: Payer: Medicare HMO

## 2022-07-19 ENCOUNTER — Ambulatory Visit (HOSPITAL_BASED_OUTPATIENT_CLINIC_OR_DEPARTMENT_OTHER): Payer: Medicare HMO | Admitting: Family

## 2022-07-19 DIAGNOSIS — Z1231 Encounter for screening mammogram for malignant neoplasm of breast: Secondary | ICD-10-CM

## 2022-07-22 ENCOUNTER — Ambulatory Visit: Payer: Medicare HMO | Admitting: General Practice

## 2022-07-22 IMAGING — DX DG CHEST 2V
2 series · 2 of 2 positions shown · non-contrast
Comparison: 12/29/2019

CLINICAL DATA: Shortness of breath.

EXAM:
CHEST - 2 VIEW

[chest pa]
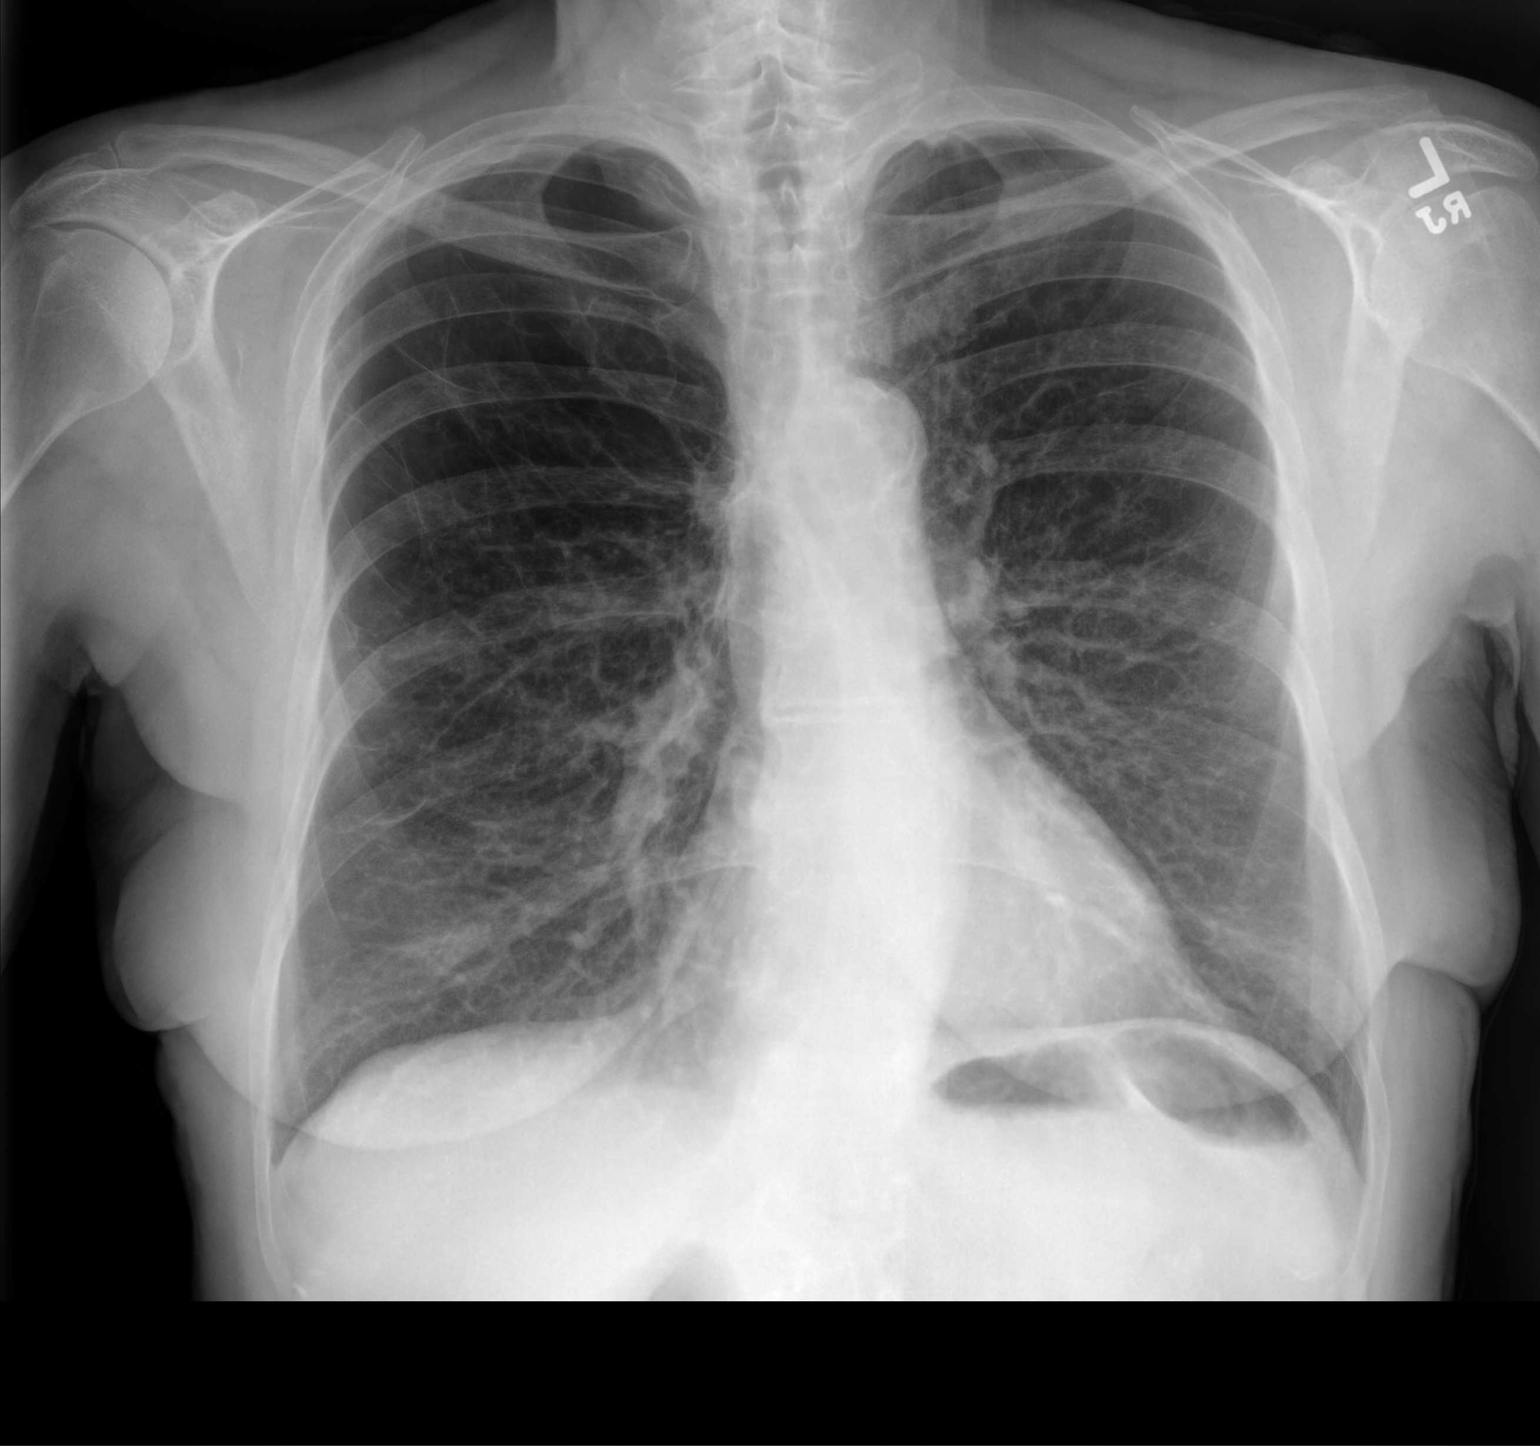

[chest lat]
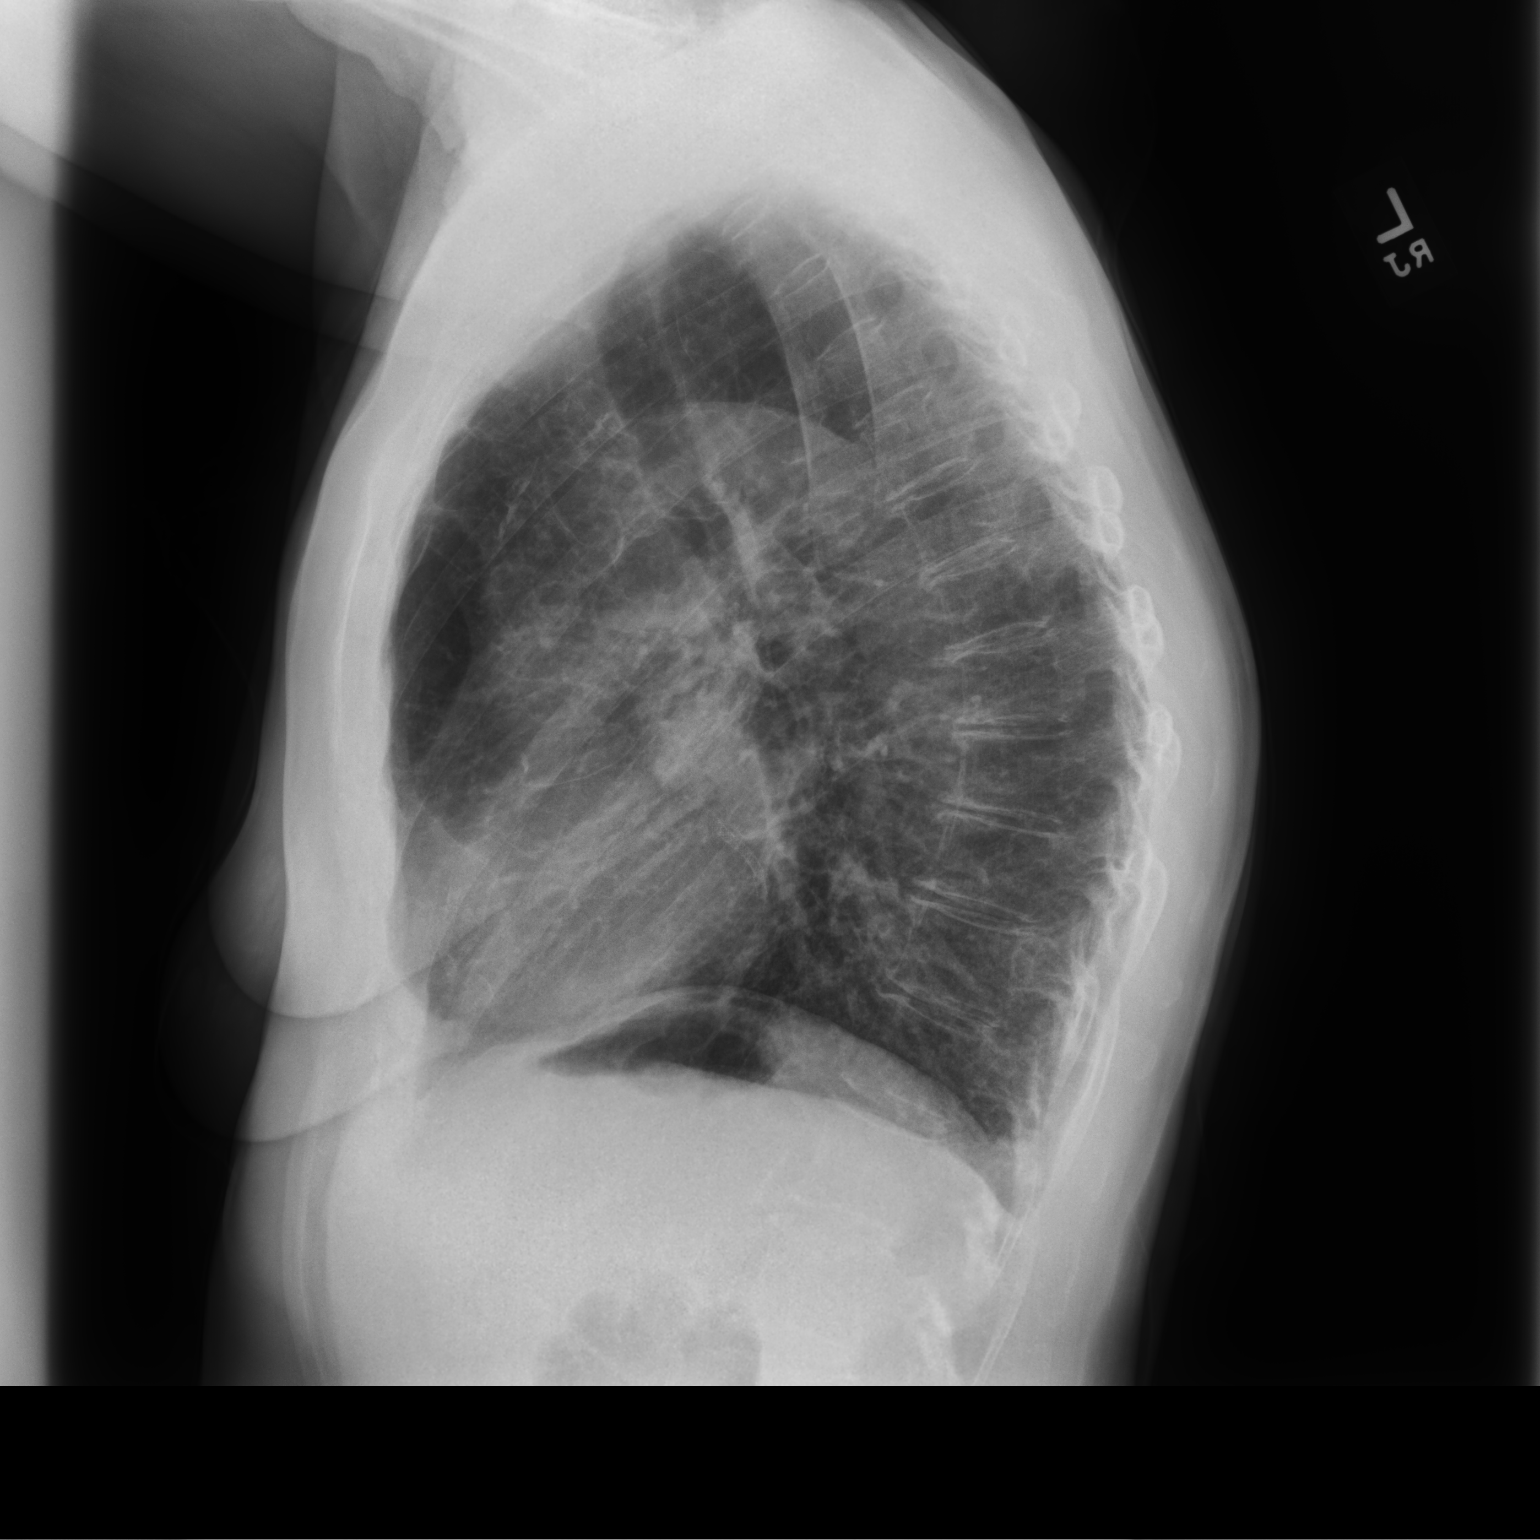

[2 of 2 positions shown; findings below may reference images not displayed]

FINDINGS: The heart size and mediastinal contours are within normal limits.
Coronary artery stent. Aortic atherosclerosis. Similar background of
hyperinflation and chronic interstitial prominence. No
consolidation. No pleural effusions or pneumothorax. No acute
osseous abnormality. Osteopenia.
IMPRESSION: No acute cardiopulmonary disease.

## 2022-07-24 ENCOUNTER — Other Ambulatory Visit: Payer: Self-pay | Admitting: Family Medicine

## 2022-07-24 DIAGNOSIS — R928 Other abnormal and inconclusive findings on diagnostic imaging of breast: Secondary | ICD-10-CM

## 2022-07-29 ENCOUNTER — Other Ambulatory Visit: Payer: Self-pay | Admitting: Radiation Therapy

## 2022-08-06 ENCOUNTER — Ambulatory Visit
Admission: RE | Admit: 2022-08-06 | Discharge: 2022-08-06 | Disposition: A | Discharge status: Home / Self Care | Payer: Medicare HMO | Source: Ambulatory Visit | Attending: Family Medicine | Admitting: Family Medicine

## 2022-08-06 ENCOUNTER — Ambulatory Visit: Payer: Medicare HMO

## 2022-08-06 DIAGNOSIS — R928 Other abnormal and inconclusive findings on diagnostic imaging of breast: Secondary | ICD-10-CM

## 2022-08-06 DIAGNOSIS — R922 Inconclusive mammogram: Secondary | ICD-10-CM | POA: Diagnosis not present

## 2022-08-08 ENCOUNTER — Ambulatory Visit (HOSPITAL_COMMUNITY)
Admission: RE | Admit: 2022-08-08 | Discharge: 2022-08-08 | Disposition: A | Discharge status: Home / Self Care | Payer: Medicare HMO | Source: Ambulatory Visit | Attending: Internal Medicine | Admitting: Internal Medicine

## 2022-08-08 DIAGNOSIS — C3490 Malignant neoplasm of unspecified part of unspecified bronchus or lung: Secondary | ICD-10-CM | POA: Diagnosis not present

## 2022-08-08 DIAGNOSIS — C3491 Malignant neoplasm of unspecified part of right bronchus or lung: Secondary | ICD-10-CM | POA: Diagnosis not present

## 2022-08-08 MED ORDER — GADOBUTROL 1 MMOL/ML IV SOLN
6.0000 mL | Freq: Once | INTRAVENOUS | Status: AC | PRN
  Administered 2022-08-08: 6 mL via INTRAVENOUS

## 2022-08-15 ENCOUNTER — Inpatient Hospital Stay: Payer: Medicare HMO | Attending: Internal Medicine | Admitting: Internal Medicine

## 2022-08-15 ENCOUNTER — Other Ambulatory Visit: Payer: Self-pay

## 2022-08-15 VITALS — BP 120/63 | HR 95 | Temp 97.9°F | Resp 17 | Wt 134.9 lb

## 2022-08-15 DIAGNOSIS — I252 Old myocardial infarction: Secondary | ICD-10-CM | POA: Insufficient documentation

## 2022-08-15 DIAGNOSIS — Z800 Family history of malignant neoplasm of digestive organs: Secondary | ICD-10-CM | POA: Diagnosis not present

## 2022-08-15 DIAGNOSIS — Z808 Family history of malignant neoplasm of other organs or systems: Secondary | ICD-10-CM | POA: Insufficient documentation

## 2022-08-15 DIAGNOSIS — C3490 Malignant neoplasm of unspecified part of unspecified bronchus or lung: Secondary | ICD-10-CM | POA: Diagnosis not present

## 2022-08-15 DIAGNOSIS — G4700 Insomnia, unspecified: Secondary | ICD-10-CM | POA: Insufficient documentation

## 2022-08-15 DIAGNOSIS — G629 Polyneuropathy, unspecified: Secondary | ICD-10-CM | POA: Insufficient documentation

## 2022-08-15 DIAGNOSIS — Z8051 Family history of malignant neoplasm of kidney: Secondary | ICD-10-CM | POA: Insufficient documentation

## 2022-08-15 DIAGNOSIS — Z8049 Family history of malignant neoplasm of other genital organs: Secondary | ICD-10-CM | POA: Diagnosis not present

## 2022-08-15 DIAGNOSIS — M5416 Radiculopathy, lumbar region: Secondary | ICD-10-CM | POA: Diagnosis not present

## 2022-08-15 DIAGNOSIS — R519 Headache, unspecified: Secondary | ICD-10-CM | POA: Insufficient documentation
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
## 2022-08-16 ENCOUNTER — Other Ambulatory Visit: Payer: Self-pay | Admitting: *Deleted

## 2022-08-16 MED ORDER — TIOTROPIUM BROMIDE MONOHYDRATE 18 MCG IN CAPS: 18.0000 ug | ORAL_CAPSULE | Freq: Every day | RESPIRATORY_TRACT | 3 refills | Status: DC

## 2022-08-19 IMAGING — MR MR HEAD WO/W CM
17 of 18 series · 43 of 48 positions shown · IV contrast (gadavist)
Comparison: Brain MRI 04/16/2019.

CLINICAL DATA: 64-year-old female with history of small cell lung
cancer status post prophylactic whole brain radiation in July 2016. Headache, neuropathy. Restaging.

The patient completed noncontrast brain imaging on 04/21/2020, and
then returned for postcontrast imaging on 04/24/2020.
EXAM:
MRI HEAD WITHOUT AND WITH CONTRAST
TECHNIQUE: Multiplanar, multiecho pulse sequences of the brain and surrounding
structures were obtained without and with intravenous contrast.
CONTRAST:  6mL GADAVIST GADOBUTROL 1 MMOL/ML IV SOLN

[Series 5: DWI · axial · 3.0mm · 1.25mm/px · z∈[-18,+131]mm · 6 of 102 slices shown (1 of 4)]
[im 1/102]
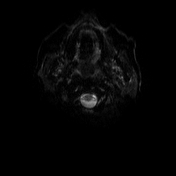
[im 21/102]
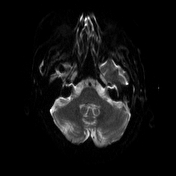
[im 41/102]
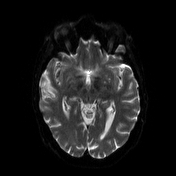
[im 61/102]
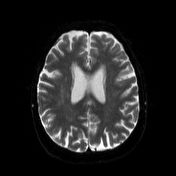
[im 81/102]
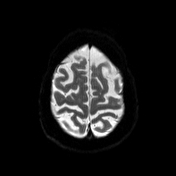
[im 102/102]
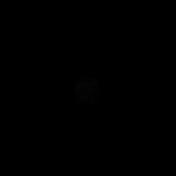

[Series 6: DWI · axial · 3.0mm · 1.25mm/px · z∈[-18,+131]mm · 3 of 52 slices shown (2 of 4)]
[im 1/52]
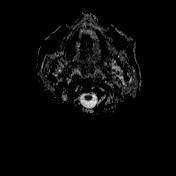
[im 26/52]
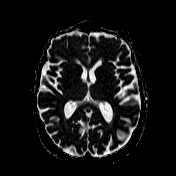
[im 52/52]
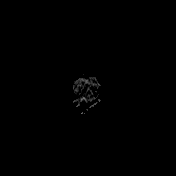

[Series 7: mip_images(sw) · axial · 24.0mm · 0.69mm/px · z∈[-19,+122]mm · 3 of 49 slices shown]
[im 1/49]
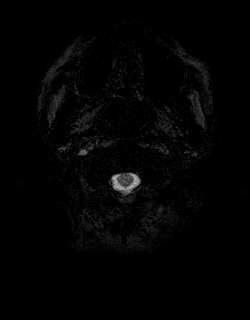
[im 25/49]
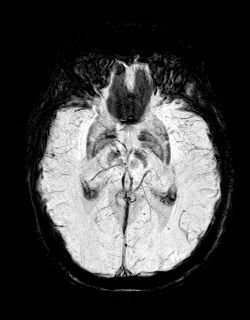
[im 49/49]
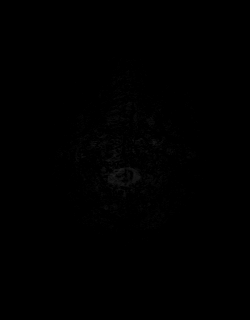

[Series 8: swi_images · axial · 3.0mm · 0.69mm/px · z∈[-29,+132]mm · 2 of 56 slices shown]
[im 1/56]
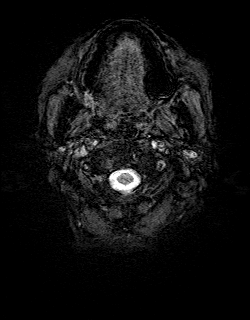
[im 56/56]
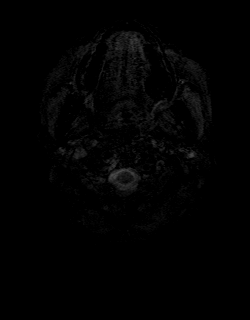

[Series 9: FLAIR · axial · 3.0mm · 0.69mm/px · z∈[-21,+132]mm · 2 of 53 slices shown (1 of 2)]
[im 1/53]
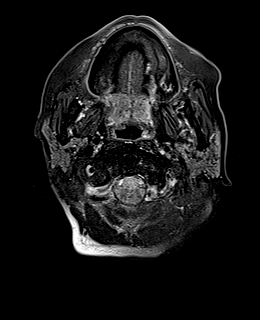
[im 53/53]
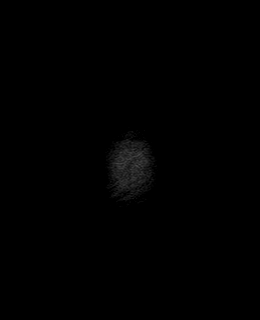

[Series 10: DWI · coronal · 5.0mm · 1.31mm/px · 3 of 72 slices shown (3 of 4)]
[im 1/72]
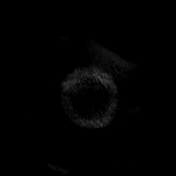
[im 36/72]
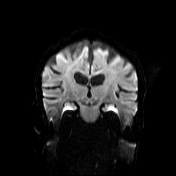
[im 72/72]
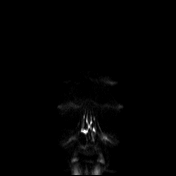

[Series 11: DWI · coronal · 5.0mm · 1.31mm/px · 2 of 36 slices shown (4 of 4)]
[im 1/36]
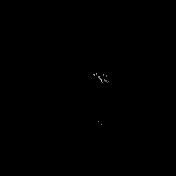
[im 36/36]
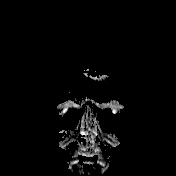

[Series 12: T1 · sagittal · 5.0mm · 0.75mm/px · 1 of 24 slices shown (1 of 4)]
[im 1/24]
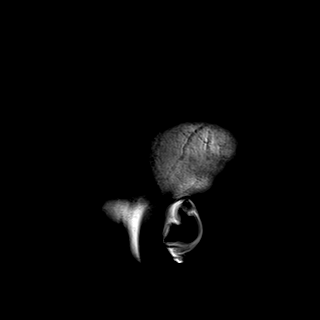

[Series 13: T2 · axial · 5.0mm · 0.57mm/px · 1 of 26 slices shown]
[im 1/26]
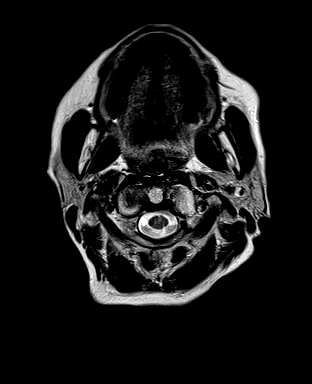

[Series 14: T1 · axial · 1.0mm · 0.86mm/px · z∈[-22,+133]mm · 7 of 160 slices shown (2 of 4)]
[im 1/160]
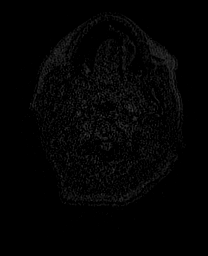
[im 27/160]
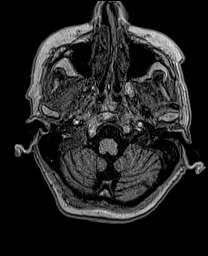
[im 54/160]
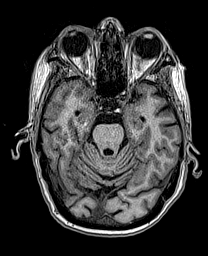
[im 80/160]
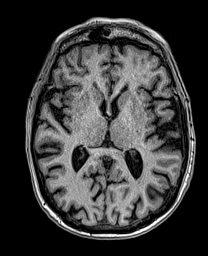
[im 107/160]
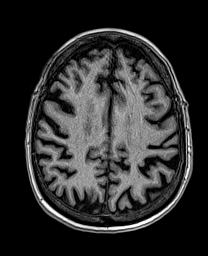
[im 133/160]
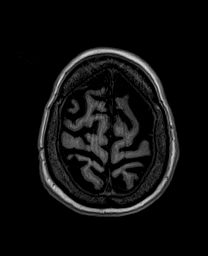
[im 160/160]
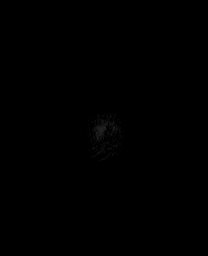

[Series 15: FLAIR · axial · 3.0mm · 0.86mm/px · z∈[-20,+133]mm · 2 of 53 slices shown (2 of 2)]
[im 1/53]
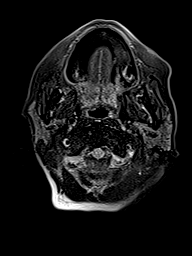
[im 53/53]
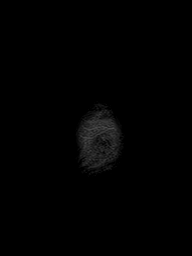

[Series 20: T2 post-contrast · coronal · 5.0mm · 0.57mm/px · 1 of 24 slices shown]
[im 1/24]
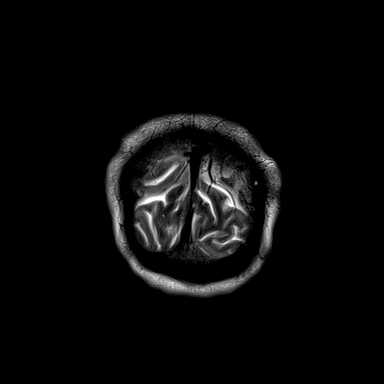

[Series 21: T1 post-contrast · axial · 1.0mm · 0.94mm/px · z∈[-55,+86]mm · 6 of 144 slices shown (1 of 3)]
[im 1/144]
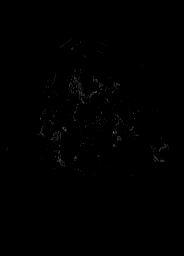
[im 29/144]
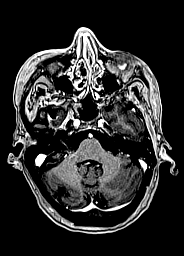
[im 58/144]
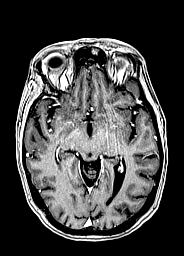
[im 86/144]
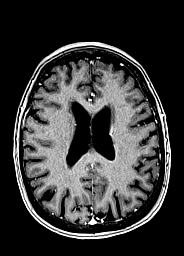
[im 115/144]
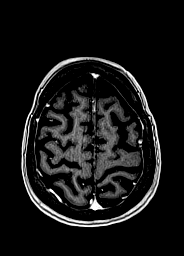
[im 144/144]
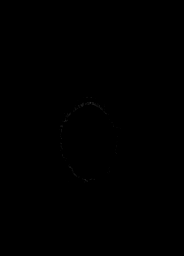

[Series 22: T1 · sagittal · 4.0mm · 0.94mm/px · 1 of 30 slices shown (3 of 4)]
[im 1/30]
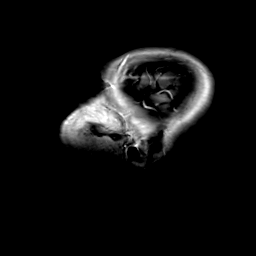

[Series 23: T1 · coronal · 4.0mm · 0.94mm/px · 1 of 30 slices shown (4 of 4)]
[im 1/30]
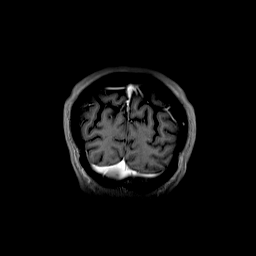

[Series 24: T1 post-contrast · coronal · 5.0mm · 0.43mm/px · 1 of 24 slices shown (2 of 3)]
[im 1/24]
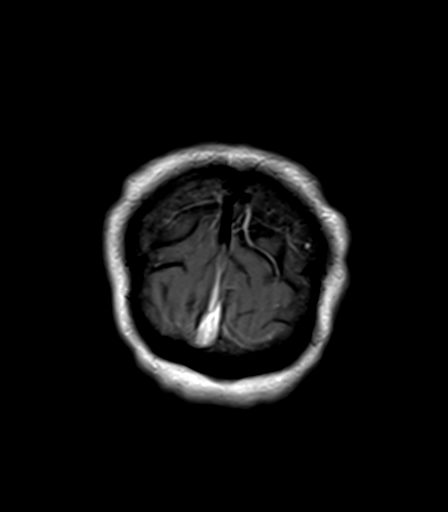

[Series 25: T1 post-contrast · sagittal · 5.0mm · 0.75mm/px · 1 of 24 slices shown (3 of 3)]
[im 1/24]
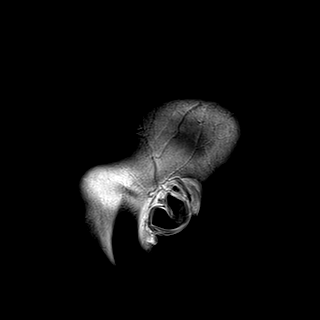

[43 of 48 positions shown; findings below may reference images not displayed]

FINDINGS: Brain: Cerebral volume is not significantly changed since last year.
No restricted diffusion to suggest acute infarction. No midline
shift, mass effect, evidence of mass lesion, ventriculomegaly,
extra-axial collection or acute intracranial hemorrhage.
Cervicomedullary junction and pituitary are within normal limits.

Patchy bilateral cerebral white matter T2 and FLAIR hyperintensity
with mild progression since last year, likely post radiation. No
areas suspicious for vasogenic edema. No cortical encephalomalacia
identified. Stable small microhemorrhage at the left parietal lobe
(series 12, image 85).

No abnormal enhancement identified.  No dural thickening.

Vascular: Major intracranial vascular flow voids are preserved.

The major dural venous sinuses are enhancing and appear to be
patent.

Skull and upper cervical spine: Visible bone marrow signal is stable
and within normal limits. Negative visible cervical spine.

Sinuses/Orbits: Stable, negative.

Other: Trace left mastoid fluid is stable. Grossly normal visible
internal auditory structures.
IMPRESSION: 1. Satisfactory post treatment appearance of the brain. No
metastatic disease or acute intracranial abnormality.

2. Mild progression of probably post radiation related white matter
signal changes since last year.

## 2022-08-26 ENCOUNTER — Encounter (HOSPITAL_BASED_OUTPATIENT_CLINIC_OR_DEPARTMENT_OTHER): Payer: Self-pay | Admitting: Pulmonary Disease

## 2022-08-26 ENCOUNTER — Ambulatory Visit (INDEPENDENT_AMBULATORY_CARE_PROVIDER_SITE_OTHER): Payer: Medicare HMO | Admitting: Pulmonary Disease

## 2022-08-26 ENCOUNTER — Telehealth: Payer: Self-pay | Admitting: Pulmonary Disease

## 2022-08-26 VITALS — HR 78 | Wt 132.0 lb

## 2022-08-26 DIAGNOSIS — J4489 Other specified chronic obstructive pulmonary disease: Secondary | ICD-10-CM

## 2022-08-26 DIAGNOSIS — J439 Emphysema, unspecified: Secondary | ICD-10-CM | POA: Diagnosis not present

## 2022-08-26 NOTE — Patient Instructions (Signed)
Will arrange to have your follow up at [facility] office and follow up in 4 months

## 2022-08-26 NOTE — Progress Notes (Signed)
[facility]  Chief Complaint  Patient presents with   Follow-up    Pt is here for follow up for copd. Pt states she does sometimes forget to take her inhalers. But she tries to take them every morning.     Past Surgical History:  She  has a past surgical history that includes Leg wound repair / closure [id number]); Coronary angioplasty with stent (10/10/2011); Coronary angioplasty with stent (11/06/2011); Tonsillectomy; Tubal ligation (1970's); Knee surgery; Coronary angioplasty with stent (11/19/2012); NM MYOVIEW LTD (October 2013; 12/2013); CPET (09/07/2012); left heart catheterization with coronary angiogram (N/A, 10/10/2011); percutaneous coronary stent intervention (pci-s) (N/A, 11/06/2011); left heart catheterization with coronary angiogram (N/A, 11/19/2012); left heart catheterization with coronary angiogram (N/A, 02/19/2013); left heart catheterization with coronary angiogram (N/A, 03/02/2014); Colonoscopy; Polypectomy; Video bronchoscopy with endobronchial ultrasound (N/A, 02/14/2016); Direct laryngoscopy (N/A, 02/14/2016); OTHER SURGICAL HISTORY; Breast biopsy (2000's); NM MYOVIEW LTD (02/2016); lipoma surgery (Left, 10/2016); transthoracic echocardiogram (08/30/2017); Esophagogastroduodenoscopy (egd) with propofol (N/A, 10/29/2018); EVENT MONITOR (03/2019); and transthoracic echocardiogram (02/02/2021).  Past Medical History:  Vitamin D deficiency, Seizures, RLS, Chronic back pain, HTN, Hiatal hernia, GERD, HLD, Palpitations, Depression, CAD, Cataracts, OA, Anxiety, COVID 22 May 2019  Constitutional:  Pulse 78   Wt 132 lb (59.9 kg)   SpO2 97%   BMI 21.31 kg/m   Brief Summary:  Michele [name] is a 67 y.o. female smoker with COPD/emphysema.  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      Subjective:   She is smoking again.  She has cough with clear sputum.  She broke her hip last Fall.  Still has hip pain.  Has trouble  with mobility.  She uses xopenex a couple times per week and this helps.  It is difficult for her to get transportation, and [facility] location was more convenient for her.   Physical Exam:   Appearance - well kempt   ENMT - no sinus tenderness, no oral exudate, no LAN, Mallampati 2 airway, no stridor  Respiratory - decreased breath sounds bilaterally, no wheezing or rales  CV - s1s2 regular rate and rhythm, no murmurs  Ext - no clubbing, no edema  Skin - no rashes  Psych - normal mood and affect   Pulmonary testing:  PFT 12/23/11 >> FEV1 2.13 (85%), FEV1% 59, TLC 5.98 (112%), DLCO 60% Spirometry 11/19/17 >> FEV1 2.58 (100%), FEV1% 76 FeNO 11/19/17 >> 5  Chest Imaging:  CT chest 08/24/16 >> CAD, mod/severe emphysema CT chest 08/29/17 >> severe emphysema CT chest 03/02/18 >> severe centrilobular and paraseptal emphysema, 4 mm nodule RUL partially calcified CT angio chest 04/27/18 >> atherosclerosis, small/mod hiatal hernia, dilation of esophagus with mucosal thickening, severe emphysema with large bulla, bronchiectasis CT chest 09/02/19 >> severe upper lobe predominant emphysema CT chest 09/25/21 >> atherosclerosis, small HH, progressive emphysema, XRT changes to Rt hilum  Cardiac Tests:  Echo 07/10/22 >> EF 60 to 65%, grade 1 DD  Social History:  She  reports that she has been smoking cigarettes. She has a 80.00 pack-year smoking history. She has never used smokeless tobacco. She reports that she does not drink alcohol and does not use drugs.  Family History:  Her family history includes Asthma in her mother; Cancer in her maternal grandmother; Colon polyps in her mother; Diabetes in her mother; Emphysema in her mother; Heart attack in her maternal grandfather; Heart disease in her father and mother; Hyperlipidemia in her mother; Hypertension in her  mother; Kidney cancer in her brother; Stomach cancer in her brother and brother; Stroke in her mother; Stroke (age of onset: 29) in her  brother; Thyroid cancer in her daughter.     Assessment/Plan:   COPD with chronic bronchitis, emphysema, and bronchiectasis. - she is intolerant of LABA's and albuterol due to palpitations - continue spiriva, arnuity - advised her to use mucinex and flutter valve bid - prn xopenex  Tobacco abuse. -  reviewed options to assist with smoking cessation  Limited stage SCLC. - s/p cisplatin/etoposide with concurrent XRT and cranial XRT - observation since 2017 - followed by Dr. Curt Bears with oncology - follow up CT chest with oncology; explained that CT chest from April 2023 did not show any signs of cancer recurrence  CAD s/p stents. - followed by Dr. Glenetta Hew with cardiology  Time Spent Involved in Patient Care on Day of Examination:  28 minutes  Follow up:   Patient Instructions  Will arrange to have your follow up at [facility] office and follow up in 4 months  Medication List:   Allergies as of 08/26/2022       Reactions   Ciprofloxacin Other (See Comments)   Hypoglycemia    Aspirin Other (See Comments)   GI upset; patient is only able to take enteric coated Aspirin.     Crestor [rosuvastatin] Other (See Comments)   Muscle pain    Ibuprofen Other (See Comments)   GI upset    Wellbutrin [bupropion] Palpitations   Amitiza [lubiprostone] Other (See Comments)   Abdominal pain   Penicillin G    Prednisone Other (See Comments)   Heart palpations   Zoloft [sertraline Hcl] Other (See Comments)   Insomnia, fatigue    Albuterol Palpitations   Lipitor [atorvastatin] Other (See Comments)   Muscle pain    Sulfonamide Derivatives Itching, Rash        Medication List        Accurate as of August 26, 2022 11:09 AM. If you have any questions, ask your nurse or doctor.          acetaminophen 325 MG tablet Commonly known as: TYLENOL Take 2 tablets (650 mg total) by mouth every 6 (six) hours as needed for mild pain, fever or headache.   ALPRAZolam 1  MG tablet Commonly known as: XANAX Take 1 mg by mouth as needed.   amitriptyline 50 MG tablet Commonly known as: ELAVIL Take 1 tablet (50 mg total) by mouth at bedtime.   Arnuity Ellipta 100 MCG/ACT Aepb Generic drug: Fluticasone Furoate Take 1 puff by mouth daily.   Centrum Silver Ultra Womens Tabs See admin instructions.   CITRACAL + D PO Take 1 tablet by mouth in the morning and at bedtime.   clopidogrel 75 MG tablet Commonly known as: PLAVIX TAKE 1 TABLET BY MOUTH EVERY MONDAY, WEDNESDAY AND FRIDAY   co-enzyme Q-10 30 MG capsule Take 30 mg by mouth daily.   fluticasone 50 MCG/ACT nasal spray Commonly known as: FLONASE INSTILL 1 SPRAY INTO BOTH NOSTRILS DAILY   Flutter Devi 1 application by Does not apply route 2 (two) times daily.   furosemide 20 MG tablet Commonly known as: LASIX TAKE 1 TABLET(20 MG) BY MOUTH DAILY   guaiFENesin 600 MG 12 hr tablet Commonly known as: Mucinex Take 1 tablet (600 mg total) by mouth 2 (two) times daily.   isosorbide mononitrate 30 MG 24 hr tablet Commonly known as: IMDUR TAKE 1 TABLET(30 MG) BY MOUTH AT BEDTIME  levalbuterol 0.63 MG/3ML nebulizer solution Commonly known as: Xopenex Take 3 mLs (0.63 mg total) by nebulization every 4 (four) hours as needed for wheezing or shortness of breath (dx: R00.2, J44.9).   levalbuterol 45 MCG/ACT inhaler Commonly known as: XOPENEX HFA Inhale 2 puffs into the lungs every 6 (six) hours as needed for wheezing.   nicotine 10 MG inhaler Commonly known as: Nicotrol Inhale 1 Cartridge (1 continuous puffing total) into the lungs as needed for smoking cessation.   nitroGLYCERIN 0.4 MG SL tablet Commonly known as: Nitrostat Place 1 tablet (0.4 mg total) under the tongue every 5 (five) minutes as needed for chest pain.   polyethylene glycol 17 g packet Commonly known as: MIRALAX / GLYCOLAX Take 17 g by mouth daily as needed for mild constipation.   pravastatin 80 MG tablet Commonly known  as: PRAVACHOL Take 1 tablet by mouth every Monday, Wednesday, Friday   Repatha SureClick [id number] MG/ML Soaj Generic drug: Evolocumab Inject 140 mg into the skin every 14 (fourteen) days.   Restasis 0.05 % ophthalmic emulsion Generic drug: cycloSPORINE Place 1 drop into both eyes 2 (two) times daily.   sodium chloride HYPERTONIC 3 % nebulizer solution Take by nebulization as needed for cough.   tiotropium 18 MCG inhalation capsule Commonly known as: Spiriva HandiHaler Place 1 capsule (18 mcg total) into inhaler and inhale daily.   Vascepa 1 g capsule Generic drug: icosapent Ethyl TAKE 1 CAPSULE BY MOUTH TWICE DAILY        Signature:  [name], MD [place] Pager - [phone] 08/26/2022, 11:09 AM

## 2022-08-26 NOTE — Telephone Encounter (Signed)
Pt seen at [facility] clinic 08/26/22  She states wanting to switch to [facility] for her care  Dr Silas Flood, are you okay taking on this pt?  Thanks!

## 2022-08-28 ENCOUNTER — Telehealth: Payer: Self-pay | Admitting: Cardiology

## 2022-08-28 NOTE — Telephone Encounter (Signed)
Pt c/o medication issue:  1. Name of Medication:   furosemide (LASIX) 20 MG tablet   2. How are you currently taking this medication (dosage and times per day)? As prescribed  3. Are you having a reaction (difficulty breathing--STAT)?   4. What is your medication issue?   Patient stated she is having to go to the bathroom too much with this medication and wants to get next steps.

## 2022-08-28 NOTE — Telephone Encounter (Signed)
Patient stated that the lasix is making her go to the bathroom a little too much for her liking. I did inform patient that lasix does make you go the bathroom to help get rid of fluid. She stated that being that she no longer has swelling she would like to cut back on he lasix. Please advise

## 2022-08-28 NOTE — Telephone Encounter (Signed)
Patient is calling to follow up. Please advise.  

## 2022-08-28 NOTE — Telephone Encounter (Signed)
Patient is aware we are waiting for response from provider.

## 2022-08-28 NOTE — Telephone Encounter (Signed)
Left voicemail for patient to return call to office. 

## 2022-08-29 ENCOUNTER — Telehealth: Payer: Self-pay | Admitting: Pulmonary Disease

## 2022-08-29 MED ORDER — SPIRIVA HANDIHALER 18 MCG IN CAPS: 18.0000 ug | ORAL_CAPSULE | Freq: Every day | RESPIRATORY_TRACT | 12 refills | Status: DC

## 2022-08-29 NOTE — Telephone Encounter (Signed)
Patient called again to follow-up on next steps regarding the Lasix.  Patient wants to know if she should stop or continue taking this medication or take lower dose.

## 2022-08-29 NOTE — Telephone Encounter (Signed)
Sent in new rx with it stating to fill [facility] name only. Nothing further needed

## 2022-08-29 NOTE — Telephone Encounter (Signed)
Pt states that she is returning call to nurse. Please advise

## 2022-08-30 NOTE — Telephone Encounter (Signed)
Spoke to  patient , if she does not have lower leg swelling  she  can stop and use if swelling returns   Keep appointment with Dr [name] 09/16/22  Patient voiced understanding.[name]

## 2022-08-30 NOTE — Telephone Encounter (Signed)
If she no longer has lower extremity edema she can discontinue her furosemide.  JB

## 2022-08-30 NOTE — Telephone Encounter (Signed)
Pt was returning a call she said she missed this morning, she stated she couldn't remember if they left a name on the voicemail but she's also waiting to hear back about medication situation she originally called in about. Please advise.

## 2022-09-02 DIAGNOSIS — N3090 Cystitis, unspecified without hematuria: Secondary | ICD-10-CM | POA: Diagnosis not present

## 2022-09-02 DIAGNOSIS — R531 Weakness: Secondary | ICD-10-CM | POA: Diagnosis not present

## 2022-09-02 DIAGNOSIS — K625 Hemorrhage of anus and rectum: Secondary | ICD-10-CM | POA: Diagnosis not present

## 2022-09-02 DIAGNOSIS — R109 Unspecified abdominal pain: Secondary | ICD-10-CM | POA: Diagnosis not present

## 2022-09-02 DIAGNOSIS — R1084 Generalized abdominal pain: Secondary | ICD-10-CM | POA: Diagnosis not present

## 2022-09-02 DIAGNOSIS — N939 Abnormal uterine and vaginal bleeding, unspecified: Secondary | ICD-10-CM | POA: Diagnosis not present

## 2022-09-02 DIAGNOSIS — K449 Diaphragmatic hernia without obstruction or gangrene: Secondary | ICD-10-CM | POA: Diagnosis not present

## 2022-09-02 NOTE — Telephone Encounter (Signed)
OK with me.

## 2022-09-02 NOTE — Telephone Encounter (Signed)
ATC X1 LVM for patient to call the office back. Please schedule new patient apt with Dr. Silas Flood around 07/25-last ov from Dr. Halford Chessman

## 2022-09-03 NOTE — Telephone Encounter (Signed)
ATC X1 LVM for patient to call the office back 

## 2022-09-03 NOTE — Telephone Encounter (Signed)
Pt returned call. Stated to her that we were going to place a recall for her to have a new pt follow up with Dr. Silas Flood at [facility] since she was wanting to stay there instead of going to [facility]. Pt verbalized understanding. Nothing further needed.

## 2022-09-06 ENCOUNTER — Telehealth: Payer: Self-pay

## 2022-09-06 NOTE — Telephone Encounter (Signed)
This nurse received a call from this patient stating that she is scheduled for a CT scan of her chest on 4/22, she would like to know if she can have a PET instead due to having throbbing pain all over her body.  This nurse forwarded patient request to provider.  No other questions noted.
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
## 2022-09-16 ENCOUNTER — Ambulatory Visit: Payer: Medicare HMO | Admitting: Cardiology

## 2022-09-16 NOTE — Progress Notes (Signed)
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 as needed for lower extremity edema, weight gain >3lbs in 1 day or >5 lbs in 1 week   Hypertension - BP well controlled on current medications   Hyperlipidemia - Lipid Panel from 06/2022 showed LDL 39, HDL 44, triglycerides 100, and total cholesterol 102  - Patient is unable to tolerate high intensity statins  - Continue reptha, pravastatin, vascepa    Near Syncope  - Patient complains that she feels like she might pass out "all the time". Occurs randomly, but mostly on exertion. Often associated with palpitation  - Ordered 7 day monitor and nuclear stress test  - She does not have carotid bruits on exam  - Echocardiogram from 07/2022 showed EF 60-65%, no regional wall motion abnormalities, grade I diastolic dysfunction, normal RV systolic function  - Checking CMP and CBC   Tobacco Use  - Patient admits to smoking 2 packs of cigarettes per day  - Counseled patient on tobacco cessation    Medication Adjustments/Labs and  Tests Ordered: Current medicines are reviewed at length with the patient today.  Concerns regarding medicines are outlined above.  Orders Placed This Encounter  Procedures   Basic metabolic panel   CBC   LONG TERM MONITOR (3-14 DAYS)   MYOCARDIAL PERFUSION IMAGING   No orders of the defined types were placed in this encounter.   Patient Instructions  Medication Instructions:   Your physician recommends that you continue on your current medications as directed. Please refer to the Current Medication list given to you today.  *If you need a refill on your cardiac medications  before your next appointment, please call your pharmacy*  Lab Work: Your physician recommends that you have lab work today: BMET CBC   If you have labs (blood work) drawn today and your tests are completely normal, you will receive your results only by: MyChart Message (if you have MyChart) OR A paper copy in the mail If you have any lab test that is abnormal or we need to change your treatment, we will call you to review the results.   Testing/Procedures: Robet Leu, PA-C has ordered a Myocardial Perfusion Imaging Study.   The test will take approximately 3 to 4 hours to complete; you may bring reading material.  If someone comes with you to your appointment, they will need to remain in the main lobby due to limited space in the testing area. **If you are pregnant or breastfeeding, please notify the nuclear lab prior to your appointment**   How to prepare for your Myocardial Perfusion Test: Do not eat or drink 3 hours prior to your test, except you may have water. Do not consume products containing caffeine (regular or decaffeinated) 12 hours prior to your test. (ex: coffee, chocolate, sodas, tea). Do wear comfortable clothes (no dresses or overalls) and walking shoes, tennis shoes preferred (No heels or open toe shoes are allowed). Do NOT wear cologne, perfume, aftershave, or lotions (deodorant is  allowed). If these instructions are not followed, your test will have to be rescheduled.   ZIO XT- Long Term Monitor Instructions  Your physician has requested you wear a ZIO patch monitor for 7 days.  This is a single patch monitor. [employer] supplies one patch monitor per enrollment. Additional stickers are not available. Please do not apply patch if you will be having a Nuclear Stress Test,  Echocardiogram, Cardiac CT, MRI, or Chest Xray during the period you would be wearing the  monitor. The patch cannot be worn during these tests. You cannot remove and re-apply the  ZIO XT patch monitor.  Your ZIO patch monitor will be mailed 3 day USPS to your address on file. It may take 3-5 days  to receive your monitor after you have been enrolled.  Once you have received your monitor, please review the enclosed instructions. Your monitor  has already been registered assigning a specific monitor serial # to you.  Billing and Patient Assistance Program Information  We have supplied [employer] with any of your insurance information on file for billing purposes. [employer] offers a sliding scale Patient Assistance Program for patients that do not have  insurance, or whose insurance does not completely cover the cost of the ZIO monitor.  You must apply for the Patient Assistance Program to qualify for this discounted rate.  To apply, please call [employer] at [phone], select option 4, select option 2, ask to apply for  Patient Assistance Program. Meredeth Ide will ask your household income, and how many people  are in your household. They will quote your out-of-pocket cost based on that information.  [employer] will also be able to set up a 41-month, interest-free payment plan if needed.  Applying the monitor   Shave hair from upper left chest.  Hold abrader disc by orange tab. Rub abrader in 40 strokes over the upper left chest as  indicated in your monitor instructions.  Clean area with 4 enclosed alcohol  pads. Let dry.  Apply patch as indicated in monitor instructions. Patch will be placed under collarbone on left  side of chest with arrow pointing upward.  Rub  patch adhesive wings for 2 minutes. Remove white label marked "1". Remove the white  label marked "2". Rub patch adhesive wings for 2 additional minutes.  While looking in a mirror, press and release button in center of patch. A small green light will  flash 3-4 times. This will be your only indicator that the monitor has been turned on.  Do not shower for the first 24 hours. You may shower after the first 24 hours.  Press the button if you feel a symptom. You will hear a small click. Record Date, Time and  Symptom in the Patient Logbook.  When you are ready to remove the patch, follow instructions on the last 2 pages of Patient  Logbook. Stick patch monitor onto the last page of Patient Logbook.  Place Patient Logbook in the blue and white box. Use locking tab on box and tape box closed  securely. The blue and white box has prepaid postage on it. Please place it in the mailbox as  soon as possible. Your physician should have your test results approximately 7 days after the  monitor has been mailed back to [facility].  Call [facility] Customer Care at [phone] if you have questions regarding  your ZIO XT patch monitor. Call them immediately if you see an orange light blinking on your  monitor.  If your monitor falls off in less than 4 days, contact our Monitor department at [phone].  If your monitor becomes loose or falls off after 4 days call [employer] at [phone] for  suggestions on securing your monitor  Follow-Up: At [facility] you and your health needs are our priority.  As part of our continuing mission to provide you with exceptional heart care, we have created designated Provider Care Teams.  These Care Teams include your primary Cardiologist (physician) and Advanced Practice Providers (APPs -   Physician Assistants and Nurse Practitioners) who all work together to provide you with the care you need, when you need it.  We recommend signing up for the patient portal called "MyChart".  Sign up information is provided on this After Visit Summary.  MyChart is used to connect with patients for Virtual Visits (Telemedicine).  Patients are able to view lab/test results, encounter notes, upcoming appointments, etc.  Non-urgent messages can be sent to your provider as well.   To learn more about what you can do with MyChart, go to [email or web address].    Your next appointment:   8 week(s)  Provider:   Robet Leu, PA-C        Other Instructions Your provider recommends HIGH COMPRESSION STOCKINGS  - -- [facility]  -- [address] [place]  -- [phone]  -- [employer]   -- [address] [place]   -- [phone] -- [facility]  -- [address] [place]  -- [phone]        How to Use Compression Stockings  Compression stockings are elastic socks that squeeze the legs. They help to increase blood flow to the legs, decrease swelling in the legs, and reduce the chance of developing blood clots in the lower legs. Compression stockings are often used by people who: Are recovering from surgery. Have poor circulation in their legs. Are prone to getting blood clots in their legs. Have varicose veins. Sit or stay in bed for long periods of time. How to use compression stockings Before you put on your compression stockings: Make sure that they are the correct  size. If you do not know your size, ask your health care provider. Make sure that they are clean, dry, and in good condition. Check them for rips and tears. Do not put them on if they are ripped or torn. Put your stockings on first thing in the morning, before you get out of bed. Keep them on for as long as your health care provider advises.  When you are wearing your stockings: Keep them as smooth as possible. Do not allow them to bunch up. It is especially important to prevent the stockings from bunching up around your toes or behind your knees. Do not roll the stockings downward and leave them rolled down. This can decrease blood flow to your leg. Change them right away if they become wet or dirty. When you take off your stockings, inspect your legs and feet. Anything that does not seem normal may require medical attention. Look for: Open sores. Red spots. Swelling. Information and tips Do not stop wearing your compression stockings without talking to your health care provider first. Wash your stockings every day with mild detergent in cold or warm water. Do not use bleach. Air-dry your stockings or dry them in a clothes dryer on low heat. Replace your stockings every 3-6 months. If skin moisturizing is part of your treatment plan, apply lotion or cream at night so that your skin will be dry when you put on the stockings in the morning. It is harder to put the stockings on when you have lotion on your legs or feet. Contact a health care provider if: Remove your stockings and seek medical care if: You have a feeling of pins and needles in your feet or legs. You have any new changes in your skin. You have skin lesions that are getting worse. You have swelling or pain that is getting worse. Get help right away if: You have numbness or tingling in your lower legs that does not get better right after you take the stockings off. Your toes or feet become cold and blue. You develop open sores or red spots on your legs that do not go away. You see or feel a warm spot on your leg. You have new swelling or soreness in your leg. You are short of breath or you have chest pain for no reason. You have a rapid or irregular heartbeat. You feel light-headed or dizzy. This information is not intended to replace advice given to you by your  health care provider. Make sure you discuss any questions you have with your health care provider. Document Released: 03/17/2009 Document Revised: 10/18/2015 Document Reviewed: 04/27/2014 Elsevier Interactive Patient Education  2017 [employer].   Steps to Quit Smoking  Smoking tobacco is the leading cause of preventable death. It can affect almost every organ in the body. Smoking puts you and people around you at risk for many serious, long-lasting (chronic) diseases. Quitting smoking can be hard, but it is one of the best things that you can do for your health. It is never too late to quit. Do not give up if you cannot quit the first time. Some people need to try many times to quit. Do your best to stick to your quit plan, and talk with your doctor if you have any questions or concerns. How do I get ready to quit? Pick a date to quit. Set a date within the next 2 weeks to give you time to prepare. Write down the reasons why you are quitting. Keep  this list in places where you will see it often. Tell your family, friends, and co-workers that you are quitting. Their support is important. Talk with your doctor about the choices that may help you quit. Find out if your health insurance will pay for these treatments. Know the people, places, things, and activities that make you want to smoke (triggers). Avoid them. What first steps can I take to quit smoking? Throw away all cigarettes at home, at work, and in your car. Throw away the things that you use when you smoke, such as ashtrays and lighters. Clean your car. Empty the ashtray. Clean your home, including curtains and carpets. What can I do to help me quit smoking? Talk with your doctor about taking medicines and seeing a counselor. You are more likely to succeed when you do both. If you are pregnant or breastfeeding: Talk with your doctor about counseling or other ways to quit smoking. Do not take medicine to help you quit smoking unless  your doctor tells you to. Quit right away Quit smoking completely, instead of slowly cutting back on how much you smoke over a period of time. Stopping smoking right away may be more successful than slowly quitting. Go to counseling. In-person is best if this is an option. You are more likely to quit if you go to counseling sessions regularly. Take medicine You may take medicines to help you quit. Some medicines need a prescription, and some you can buy over-the-counter. Some medicines may contain a drug called nicotine to replace the nicotine in cigarettes. Medicines may: Help you stop having the desire to smoke (cravings). Help to stop the problems that come when you stop smoking (withdrawal symptoms). Your doctor may ask you to use: Nicotine patches, gum, or lozenges. Nicotine inhalers or sprays. Non-nicotine medicine that you take by mouth. Find resources Find resources and other ways to help you quit smoking and remain smoke-free after you quit. They include: Online chats with a Veterinary surgeon. Phone quitlines. Printed Materials engineer. Support groups or group counseling. Text messaging programs. Mobile phone apps. Use apps on your mobile phone or tablet that can help you stick to your quit plan. Examples of free services include Quit Guide from the CDC and smokefree.gov  What can I do to make it easier to quit?  Talk to your family and friends. Ask them to support and encourage you. Call a phone quitline, such as 1-800-QUIT-NOW, reach out to support groups, or work with a Veterinary surgeon. Ask people who smoke to not smoke around you. Avoid places that make you want to smoke, such as: Bars. Parties. Smoke-break areas at work. Spend time with people who do not smoke. Lower the stress in your life. Stress can make you want to smoke. Try these things to lower stress: Getting regular exercise. Doing deep-breathing exercises. Doing yoga. Meditating. What benefits will I see if I quit  smoking? Over time, you may have: A better sense of smell and taste. Less coughing and sore throat. A slower heart rate. Lower blood pressure. Clearer skin. Better breathing. Fewer sick days. Summary Quitting smoking can be hard, but it is one of the best things that you can do for your health. Do not give up if you cannot quit the first time. Some people need to try many times to quit. When you decide to quit smoking, make a plan to help you succeed. Quit smoking right away, not slowly over a period of time. When you start quitting, get help and  support to keep you smoke-free. This information is not intended to replace advice given to you by your health care provider. Make sure you discuss any questions you have with your health care provider. Document Revised: 05/11/2021 Document Reviewed: 05/11/2021 Elsevier Patient Education  [address]    Signed, Jonita Albee, PA-C  09/19/2022 11:56 AM    [facility]

## 2022-09-18 DIAGNOSIS — K649 Unspecified hemorrhoids: Secondary | ICD-10-CM | POA: Diagnosis not present

## 2022-09-18 DIAGNOSIS — R399 Unspecified symptoms and signs involving the genitourinary system: Secondary | ICD-10-CM | POA: Diagnosis not present

## 2022-09-18 DIAGNOSIS — N950 Postmenopausal bleeding: Secondary | ICD-10-CM | POA: Diagnosis not present

## 2022-09-18 DIAGNOSIS — N644 Mastodynia: Secondary | ICD-10-CM | POA: Diagnosis not present

## 2022-09-18 DIAGNOSIS — Z01419 Encounter for gynecological examination (general) (routine) without abnormal findings: Secondary | ICD-10-CM | POA: Diagnosis not present

## 2022-09-19 ENCOUNTER — Encounter: Payer: Self-pay | Admitting: Student

## 2022-09-19 ENCOUNTER — Ambulatory Visit: Payer: Medicare HMO | Attending: Student | Admitting: Cardiology

## 2022-09-19 ENCOUNTER — Ambulatory Visit (INDEPENDENT_AMBULATORY_CARE_PROVIDER_SITE_OTHER): Payer: Medicare HMO

## 2022-09-19 VITALS — BP 118/64 | HR 73 | Ht 65.5 in | Wt 136.2 lb

## 2022-09-19 DIAGNOSIS — I700 Atherosclerosis of aorta: Secondary | ICD-10-CM | POA: Diagnosis not present

## 2022-09-19 DIAGNOSIS — I771 Stricture of artery: Secondary | ICD-10-CM

## 2022-09-19 DIAGNOSIS — R55 Syncope and collapse: Secondary | ICD-10-CM

## 2022-09-19 DIAGNOSIS — I25119 Atherosclerotic heart disease of native coronary artery with unspecified angina pectoris: Secondary | ICD-10-CM | POA: Diagnosis not present

## 2022-09-19 DIAGNOSIS — E785 Hyperlipidemia, unspecified: Secondary | ICD-10-CM

## 2022-09-19 DIAGNOSIS — I10 Essential (primary) hypertension: Secondary | ICD-10-CM | POA: Diagnosis not present

## 2022-09-19 DIAGNOSIS — I5032 Chronic diastolic (congestive) heart failure: Secondary | ICD-10-CM

## 2022-09-19 DIAGNOSIS — R072 Precordial pain: Secondary | ICD-10-CM

## 2022-09-19 DIAGNOSIS — Z720 Tobacco use: Secondary | ICD-10-CM

## 2022-09-19 DIAGNOSIS — M79604 Pain in right leg: Secondary | ICD-10-CM | POA: Diagnosis not present

## 2022-09-19 DIAGNOSIS — I872 Venous insufficiency (chronic) (peripheral): Secondary | ICD-10-CM | POA: Diagnosis not present

## 2022-09-19 DIAGNOSIS — M79605 Pain in left leg: Secondary | ICD-10-CM

## 2022-09-19 NOTE — Patient Instructions (Addendum)
Medication Instructions:   Your physician recommends that you continue on your current medications as directed. Please refer to the Current Medication list given to you today.  *If you need a refill on your cardiac medications before your next appointment, please call your pharmacy*  Lab Work: Your physician recommends that you have lab work today: BMET CBC   If you have labs (blood work) drawn today and your tests are completely normal, you will receive your results only by: MyChart Message (if you have MyChart) OR A paper copy in the mail If you have any lab test that is abnormal or we need to change your treatment, we will call you to review the results.   Testing/Procedures: Robet Leu, PA-C has ordered a Myocardial Perfusion Imaging Study.   The test will take approximately 3 to 4 hours to complete; you may bring reading material.  If someone comes with you to your appointment, they will need to remain in the main lobby due to limited space in the testing area. **If you are pregnant or breastfeeding, please notify the nuclear lab prior to your appointment**   How to prepare for your Myocardial Perfusion Test: Do not eat or drink 3 hours prior to your test, except you may have water. Do not consume products containing caffeine (regular or decaffeinated) 12 hours prior to your test. (ex: coffee, chocolate, sodas, tea). Do wear comfortable clothes (no dresses or overalls) and walking shoes, tennis shoes preferred (No heels or open toe shoes are allowed). Do NOT wear cologne, perfume, aftershave, or lotions (deodorant is allowed). If these instructions are not followed, your test will have to be rescheduled.   ZIO XT- Long Term Monitor Instructions  Your physician has requested you wear a ZIO patch monitor for 7 days.  This is a single patch monitor. [employer] supplies one patch monitor per enrollment. Additional stickers are not available. Please do not apply patch if you will  be having a Nuclear Stress Test,  Echocardiogram, Cardiac CT, MRI, or Chest Xray during the period you would be wearing the  monitor. The patch cannot be worn during these tests. You cannot remove and re-apply the  ZIO XT patch monitor.  Your ZIO patch monitor will be mailed 3 day USPS to your address on file. It may take 3-5 days  to receive your monitor after you have been enrolled.  Once you have received your monitor, please review the enclosed instructions. Your monitor  has already been registered assigning a specific monitor serial # to you.  Billing and Patient Assistance Program Information  We have supplied [employer] with any of your insurance information on file for billing purposes. [employer] offers a sliding scale Patient Assistance Program for patients that do not have  insurance, or whose insurance does not completely cover the cost of the ZIO monitor.  You must apply for the Patient Assistance Program to qualify for this discounted rate.  To apply, please call [employer] at [phone], select option 4, select option 2, ask to apply for  Patient Assistance Program. Meredeth Ide will ask your household income, and how many people  are in your household. They will quote your out-of-pocket cost based on that information.  [employer] will also be able to set up a 21-month, interest-free payment plan if needed.  Applying the monitor   Shave hair from upper left chest.  Hold abrader disc by orange tab. Rub abrader in 40 strokes over the upper left chest as  indicated in your monitor instructions.  Clean area with 4 enclosed alcohol pads. Let dry.  Apply patch as indicated in monitor instructions. Patch will be placed under collarbone on left  side of chest with arrow pointing upward.  Rub patch adhesive wings for 2 minutes. Remove white label marked "1". Remove the white  label marked "2". Rub patch adhesive wings for 2 additional minutes.  While looking in a mirror, press and release  button in center of patch. A small green light will  flash 3-4 times. This will be your only indicator that the monitor has been turned on.  Do not shower for the first 24 hours. You may shower after the first 24 hours.  Press the button if you feel a symptom. You will hear a small click. Record Date, Time and  Symptom in the Patient Logbook.  When you are ready to remove the patch, follow instructions on the last 2 pages of Patient  Logbook. Stick patch monitor onto the last page of Patient Logbook.  Place Patient Logbook in the blue and white box. Use locking tab on box and tape box closed  securely. The blue and white box has prepaid postage on it. Please place it in the mailbox as  soon as possible. Your physician should have your test results approximately 7 days after the  monitor has been mailed back to [facility].  Call [facility] Customer Care at [phone] if you have questions regarding  your ZIO XT patch monitor. Call them immediately if you see an orange light blinking on your  monitor.  If your monitor falls off in less than 4 days, contact our Monitor department at [phone].  If your monitor becomes loose or falls off after 4 days call [employer] at [phone] for  suggestions on securing your monitor  Follow-Up: At [facility], you and your health needs are our priority.  As part of our continuing mission to provide you with exceptional heart care, we have created designated Provider Care Teams.  These Care Teams include your primary Cardiologist (physician) and Advanced Practice Providers (APPs -  Physician Assistants and Nurse Practitioners) who all work together to provide you with the care you need, when you need it.  We recommend signing up for the patient portal called "MyChart".  Sign up information is provided on this After Visit Summary.  MyChart is used to connect with patients for Virtual Visits (Telemedicine).  Patients are able to view  lab/test results, encounter notes, upcoming appointments, etc.  Non-urgent messages can be sent to your provider as well.   To learn more about what you can do with MyChart, go to [email or web address].    Your next appointment:   8 week(s)  Provider:   Robet Leu, PA-C        Other Instructions Your provider recommends HIGH COMPRESSION STOCKINGS  - -- [facility]  -- [address] [place]  -- [phone]  -- [employer]   -- [address] [place]   -- [phone] -- [facility]  -- [address] [place]  -- [phone]        How to Use Compression Stockings  Compression stockings are elastic socks that squeeze the legs. They help to increase blood flow to the legs, decrease swelling in the legs, and reduce the chance of developing blood clots in the lower legs. Compression stockings are often used by people who: Are recovering from surgery. Have poor circulation in their legs. Are prone to getting blood  clots in their legs. Have varicose veins. Sit or stay in bed for long periods of time. How to use compression stockings Before you put on your compression stockings: Make sure that they are the correct size. If you do not know your size, ask your health care provider. Make sure that they are clean, dry, and in good condition. Check them for rips and tears. Do not put them on if they are ripped or torn. Put your stockings on first thing in the morning, before you get out of bed. Keep them on for as long as your health care provider advises. When you are wearing your stockings: Keep them as smooth as possible. Do not allow them to bunch up. It is especially important to prevent the stockings from bunching up around your toes or behind your knees. Do not roll the stockings downward and leave them rolled down. This can decrease blood flow to your leg. Change them right away if they become wet  or dirty. When you take off your stockings, inspect your legs and feet. Anything that does not seem normal may require medical attention. Look for: Open sores. Red spots. Swelling. Information and tips Do not stop wearing your compression stockings without talking to your health care provider first. Wash your stockings every day with mild detergent in cold or warm water. Do not use bleach. Air-dry your stockings or dry them in a clothes dryer on low heat. Replace your stockings every 3-6 months. If skin moisturizing is part of your treatment plan, apply lotion or cream at night so that your skin will be dry when you put on the stockings in the morning. It is harder to put the stockings on when you have lotion on your legs or feet. Contact a health care provider if: Remove your stockings and seek medical care if: You have a feeling of pins and needles in your feet or legs. You have any new changes in your skin. You have skin lesions that are getting worse. You have swelling or pain that is getting worse. Get help right away if: You have numbness or tingling in your lower legs that does not get better right after you take the stockings off. Your toes or feet become cold and blue. You develop open sores or red spots on your legs that do not go away. You see or feel a warm spot on your leg. You have new swelling or soreness in your leg. You are short of breath or you have chest pain for no reason. You have a rapid or irregular heartbeat. You feel light-headed or dizzy. This information is not intended to replace advice given to you by your health care provider. Make sure you discuss any questions you have with your health care provider. Document Released: 03/17/2009 Document Revised: 10/18/2015 Document Reviewed: 04/27/2014 Elsevier Interactive Patient Education  2017 [employer].   Steps to Quit Smoking  Smoking tobacco is the leading cause of preventable death. It can affect almost  every organ in the body. Smoking puts you and people around you at risk for many serious, long-lasting (chronic) diseases. Quitting smoking can be hard, but it is one of the best things that you can do for your health. It is never too late to quit. Do not give up if you cannot quit the first time. Some people need to try many times to quit. Do your best to stick to your quit plan, and talk with your doctor if you have any questions or  concerns. How do I get ready to quit? Pick a date to quit. Set a date within the next 2 weeks to give you time to prepare. Write down the reasons why you are quitting. Keep this list in places where you will see it often. Tell your family, friends, and co-workers that you are quitting. Their support is important. Talk with your doctor about the choices that may help you quit. Find out if your health insurance will pay for these treatments. Know the people, places, things, and activities that make you want to smoke (triggers). Avoid them. What first steps can I take to quit smoking? Throw away all cigarettes at home, at work, and in your car. Throw away the things that you use when you smoke, such as ashtrays and lighters. Clean your car. Empty the ashtray. Clean your home, including curtains and carpets. What can I do to help me quit smoking? Talk with your doctor about taking medicines and seeing a counselor. You are more likely to succeed when you do both. If you are pregnant or breastfeeding: Talk with your doctor about counseling or other ways to quit smoking. Do not take medicine to help you quit smoking unless your doctor tells you to. Quit right away Quit smoking completely, instead of slowly cutting back on how much you smoke over a period of time. Stopping smoking right away may be more successful than slowly quitting. Go to counseling. In-person is best if this is an option. You are more likely to quit if you go to counseling sessions regularly. Take  medicine You may take medicines to help you quit. Some medicines need a prescription, and some you can buy over-the-counter. Some medicines may contain a drug called nicotine to replace the nicotine in cigarettes. Medicines may: Help you stop having the desire to smoke (cravings). Help to stop the problems that come when you stop smoking (withdrawal symptoms). Your doctor may ask you to use: Nicotine patches, gum, or lozenges. Nicotine inhalers or sprays. Non-nicotine medicine that you take by mouth. Find resources Find resources and other ways to help you quit smoking and remain smoke-free after you quit. They include: Online chats with a Veterinary surgeon. Phone quitlines. Printed Materials engineer. Support groups or group counseling. Text messaging programs. Mobile phone apps. Use apps on your mobile phone or tablet that can help you stick to your quit plan. Examples of free services include Quit Guide from the CDC and smokefree.gov  What can I do to make it easier to quit?  Talk to your family and friends. Ask them to support and encourage you. Call a phone quitline, such as 1-800-QUIT-NOW, reach out to support groups, or work with a Veterinary surgeon. Ask people who smoke to not smoke around you. Avoid places that make you want to smoke, such as: Bars. Parties. Smoke-break areas at work. Spend time with people who do not smoke. Lower the stress in your life. Stress can make you want to smoke. Try these things to lower stress: Getting regular exercise. Doing deep-breathing exercises. Doing yoga. Meditating. What benefits will I see if I quit smoking? Over time, you may have: A better sense of smell and taste. Less coughing and sore throat. A slower heart rate. Lower blood pressure. Clearer skin. Better breathing. Fewer sick days. Summary Quitting smoking can be hard, but it is one of the best things that you can do for your health. Do not give up if you cannot quit the first time.  Some people need to  try many times to quit. When you decide to quit smoking, make a plan to help you succeed. Quit smoking right away, not slowly over a period of time. When you start quitting, get help and support to keep you smoke-free. This information is not intended to replace advice given to you by your health care provider. Make sure you discuss any questions you have with your health care provider. Document Revised: 05/11/2021 Document Reviewed: 05/11/2021 Elsevier Patient Education  2023 [employer].

## 2022-09-19 NOTE — Progress Notes (Unsigned)
Enrolled for [employer] to mail a ZIO XT long term holter monitor to the patients address on file.   Dr. [name] to read.  3rd redo serial # [id number] applied to patient from office inventory.

## 2022-09-20 ENCOUNTER — Telehealth: Payer: Self-pay | Admitting: Pulmonary Disease

## 2022-09-20 ENCOUNTER — Telehealth: Payer: Self-pay

## 2022-09-20 ENCOUNTER — Telehealth: Payer: Self-pay | Admitting: Cardiology

## 2022-09-20 LAB — BASIC METABOLIC PANEL
BUN/Creatinine Ratio: 16 (ref 12–28)
BUN: 10 mg/dL (ref 8–27)
CO2: 23 mmol/L (ref 20–29)
Calcium: 9.6 mg/dL (ref 8.7–10.3)
Chloride: 105 mmol/L (ref 96–106)
Creatinine, Ser: 0.61 mg/dL (ref 0.57–1.00)
Glucose: 110 mg/dL — ABNORMAL HIGH (ref 70–99)
Potassium: 4.6 mmol/L (ref 3.5–5.2)
Sodium: 142 mmol/L (ref 134–144)
eGFR: 97 mL/min/{1.73_m2} (ref >59)

## 2022-09-20 LAB — CBC
Hematocrit: 41.1 % (ref 34.0–46.6)
Hemoglobin: 13.5 g/dL (ref 11.1–15.9)
MCH: 29.5 pg (ref 26.6–33.0)
MCHC: 32.8 g/dL (ref 31.5–35.7)
MCV: 90 fL (ref 79–97)
Platelets: 232 10*3/uL (ref 150–450)
RBC: 4.57 x10E6/uL (ref 3.77–5.28)
RDW: 13.6 % (ref 11.7–15.4)
WBC: 5.3 10*3/uL (ref 3.4–10.8)

## 2022-09-20 MED ORDER — AZITHROMYCIN 250 MG PO TABS: ORAL_TABLET | ORAL | 0 refills | Status: DC

## 2022-09-20 NOTE — Telephone Encounter (Signed)
Gave patient lab results and stated as provider and normal results .  She continue to argue that this is incorrect. And she sees 145 for BG and that is high. I do not see what she is referring to.  I advised the lab results and PA-C message.  She continues about the value and she is concerned.  I advised at that time to upload a copy of what she is seeing with the result of 145 that she is referring to and we can review, but As of now the results are good and doctor states this.

## 2022-09-20 NOTE — Telephone Encounter (Signed)
-----   Message from Kathleen R Johnson, PA-C sent at 09/20/2022 12:18 PM EDT ----- Please tell patient that her electrolytes and kidney function were all within normal limits. Her blood glucose was just slightly elevated, but this is normal if she was not fasting. Her blood counts were all within normal limits as well. OK to continue current medications and follow up as arranged.   Thanks! KJ    

## 2022-09-20 NOTE — Telephone Encounter (Signed)
If she is still smoking, then she needs to stop smoking.  Can send script for a zpak.

## 2022-09-20 NOTE — Telephone Encounter (Signed)
ATC X1 LVM for patient to call the office back 

## 2022-09-20 NOTE — Telephone Encounter (Signed)
Pt missed call and is calling back

## 2022-09-20 NOTE — Telephone Encounter (Signed)
Called and spoke with patient. She verbalized understanding. RX has been sent. Nothing further needed at time of call.  

## 2022-09-20 NOTE — Telephone Encounter (Signed)
Left voicemail for patient to return call to office. 

## 2022-09-20 NOTE — Telephone Encounter (Signed)
Patient is calling is calling for her lab result. She saw them on MyChart and is concerned.

## 2022-09-20 NOTE — Telephone Encounter (Signed)
Michele [name] from [facility] cxalled in bc Pt hasd been having a increased chest congestion, sob, Increase Phelgym and has been taking her Arnuity and Sprivia. Per Dr. Excell Seltzer he wanted to see if we had any recommendations for Pt. Offered an appt but the Pt didn't answer the phone

## 2022-09-20 NOTE — Telephone Encounter (Signed)
Spoke with patient she complains of weakness, increased sob-not bad today, productive cough with thick white phlegm. Symptoms have been present for several months Patient has been using inhalers with no improvement   Pharmacy [facility] [address]  Dr. Craige Cotta can you please advise?

## 2022-09-23 ENCOUNTER — Telehealth: Payer: Self-pay

## 2022-09-23 ENCOUNTER — Telehealth: Payer: Self-pay | Admitting: Cardiology

## 2022-09-23 NOTE — Telephone Encounter (Signed)
-----   Message from Jonita Albee, PA-C sent at 09/20/2022 12:18 PM EDT ----- Please tell patient that her electrolytes and kidney function were all within normal limits. Her blood glucose was just slightly elevated, but this is normal if she was not fasting. Her blood counts were all within normal limits as well. OK to continue current medications and follow up as arranged.   Thanks! KJ

## 2022-09-23 NOTE — Telephone Encounter (Signed)
Patient was returning phone call 

## 2022-09-23 NOTE — Telephone Encounter (Signed)
Patient is aware of lab results. She stated she relieved her heart monitor but does not want to start with heart monitor until after her appointment on the 29th.

## 2022-09-23 NOTE — Telephone Encounter (Signed)
Left voicemail for patient to return call to office. 

## 2022-09-24 ENCOUNTER — Ambulatory Visit (HOSPITAL_COMMUNITY)
Admission: RE | Admit: 2022-09-24 | Discharge: 2022-09-24 | Disposition: A | Discharge status: Home / Self Care | Payer: Medicare HMO | Source: Ambulatory Visit | Attending: Internal Medicine | Admitting: Internal Medicine

## 2022-09-24 DIAGNOSIS — C3490 Malignant neoplasm of unspecified part of unspecified bronchus or lung: Secondary | ICD-10-CM | POA: Insufficient documentation

## 2022-09-24 DIAGNOSIS — J439 Emphysema, unspecified: Secondary | ICD-10-CM | POA: Diagnosis not present

## 2022-09-24 MED ORDER — IOHEXOL 300 MG/ML  SOLN
75.0000 mL | Freq: Once | INTRAMUSCULAR | Status: AC | PRN
  Administered 2022-09-24: 75 mL via INTRAVENOUS

## 2022-09-24 MED ORDER — SODIUM CHLORIDE (PF) 0.9 % IJ SOLN
INTRAMUSCULAR | Status: AC
  Filled 2022-09-24: qty 50

## 2022-09-25 ENCOUNTER — Telehealth (HOSPITAL_COMMUNITY): Payer: Self-pay | Admitting: *Deleted

## 2022-09-25 NOTE — Telephone Encounter (Signed)
Left message on voicemail per DPR in reference to upcoming appointment scheduled on  10/02/22 with detailed instructions given per Myocardial Perfusion Study Information Sheet for the test. LM to arrive 15 minutes early, and that it is imperative to arrive on time for appointment to keep from having the test rescheduled. If you need to cancel or reschedule your appointment, please call the office within 24 hours of your appointment. Failure to do so may result in a cancellation of your appointment, and a $50 no show fee. Phone number given for call back for any questions. Michele [name]

## 2022-09-26 ENCOUNTER — Other Ambulatory Visit: Payer: Self-pay

## 2022-09-26 ENCOUNTER — Inpatient Hospital Stay: Payer: Medicare HMO

## 2022-09-26 ENCOUNTER — Inpatient Hospital Stay: Payer: Medicare HMO | Attending: Internal Medicine | Admitting: Internal Medicine

## 2022-09-26 VITALS — BP 124/66 | HR 94 | Temp 97.3°F | Resp 18 | Wt 133.3 lb

## 2022-09-26 DIAGNOSIS — Z85118 Personal history of other malignant neoplasm of bronchus and lung: Secondary | ICD-10-CM | POA: Insufficient documentation

## 2022-09-26 DIAGNOSIS — R739 Hyperglycemia, unspecified: Secondary | ICD-10-CM | POA: Diagnosis not present

## 2022-09-26 DIAGNOSIS — I252 Old myocardial infarction: Secondary | ICD-10-CM | POA: Diagnosis not present

## 2022-09-26 DIAGNOSIS — Z9221 Personal history of antineoplastic chemotherapy: Secondary | ICD-10-CM | POA: Insufficient documentation

## 2022-09-26 DIAGNOSIS — C3490 Malignant neoplasm of unspecified part of unspecified bronchus or lung: Secondary | ICD-10-CM | POA: Diagnosis not present

## 2022-09-26 DIAGNOSIS — Z923 Personal history of irradiation: Secondary | ICD-10-CM | POA: Diagnosis not present

## 2022-09-26 DIAGNOSIS — R300 Dysuria: Secondary | ICD-10-CM | POA: Insufficient documentation

## 2022-09-26 LAB — CMP (CANCER CENTER ONLY)
ALT: 7 U/L (ref 0–44)
AST: 12 U/L — ABNORMAL LOW (ref 15–41)
Albumin: 4.2 g/dL (ref 3.5–5.0)
Alkaline Phosphatase: 61 U/L (ref 38–126)
Anion gap: 6 (ref 5–15)
BUN: 12 mg/dL (ref 8–23)
CO2: 29 mmol/L (ref 22–32)
Calcium: 9.8 mg/dL (ref 8.9–10.3)
Chloride: 106 mmol/L (ref 98–111)
Creatinine: 0.81 mg/dL (ref 0.44–1.00)
GFR, Estimated: >60 mL/min (ref >60)
Glucose, Bld: 129 mg/dL — ABNORMAL HIGH (ref 70–99)
Potassium: 4 mmol/L (ref 3.5–5.1)
Sodium: 141 mmol/L (ref 135–145)
Total Bilirubin: 0.5 mg/dL (ref 0.3–1.2)
Total Protein: 6.5 g/dL (ref 6.5–8.1)

## 2022-09-26 LAB — CBC WITH DIFFERENTIAL (CANCER CENTER ONLY)
Abs Immature Granulocytes: 0.02 10*3/uL (ref 0.00–0.07)
Basophils Absolute: 0 10*3/uL (ref 0.0–0.1)
Basophils Relative: 0 %
Eosinophils Absolute: 0.1 10*3/uL (ref 0.0–0.5)
Eosinophils Relative: 1 %
HCT: 41.5 % (ref 36.0–46.0)
Hemoglobin: 14 g/dL (ref 12.0–15.0)
Immature Granulocytes: 0 %
Lymphocytes Relative: 7 %
Lymphs Abs: 0.6 10*3/uL — ABNORMAL LOW (ref 0.7–4.0)
MCH: 30.6 pg (ref 26.0–34.0)
MCHC: 33.7 g/dL (ref 30.0–36.0)
MCV: 90.6 fL (ref 80.0–100.0)
Monocytes Absolute: 0.5 10*3/uL (ref 0.1–1.0)
Monocytes Relative: 6 %
Neutro Abs: 6.7 10*3/uL (ref 1.7–7.7)
Neutrophils Relative %: 86 %
Platelet Count: 233 10*3/uL (ref 150–400)
RBC: 4.58 MIL/uL (ref 3.87–5.11)
RDW: 14.2 % (ref 11.5–15.5)
WBC Count: 7.8 10*3/uL (ref 4.0–10.5)
nRBC: 0 % (ref 0.0–0.2)
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
## 2022-09-27 DIAGNOSIS — I700 Atherosclerosis of aorta: Secondary | ICD-10-CM | POA: Diagnosis not present

## 2022-09-27 DIAGNOSIS — R7309 Other abnormal glucose: Secondary | ICD-10-CM | POA: Diagnosis not present

## 2022-09-27 DIAGNOSIS — I5032 Chronic diastolic (congestive) heart failure: Secondary | ICD-10-CM | POA: Diagnosis not present

## 2022-09-27 DIAGNOSIS — C3491 Malignant neoplasm of unspecified part of right bronchus or lung: Secondary | ICD-10-CM | POA: Diagnosis not present

## 2022-09-27 DIAGNOSIS — R7303 Prediabetes: Secondary | ICD-10-CM | POA: Diagnosis not present

## 2022-09-27 DIAGNOSIS — F419 Anxiety disorder, unspecified: Secondary | ICD-10-CM | POA: Diagnosis not present

## 2022-09-27 DIAGNOSIS — B3731 Acute candidiasis of vulva and vagina: Secondary | ICD-10-CM | POA: Diagnosis not present

## 2022-09-27 DIAGNOSIS — J449 Chronic obstructive pulmonary disease, unspecified: Secondary | ICD-10-CM | POA: Diagnosis not present

## 2022-09-27 DIAGNOSIS — R8281 Pyuria: Secondary | ICD-10-CM | POA: Diagnosis not present

## 2022-09-27 DIAGNOSIS — K219 Gastro-esophageal reflux disease without esophagitis: Secondary | ICD-10-CM | POA: Diagnosis not present

## 2022-09-30 ENCOUNTER — Ambulatory Visit (HOSPITAL_COMMUNITY): Payer: Medicare HMO

## 2022-09-30 ENCOUNTER — Ambulatory Visit: Payer: Medicare HMO | Admitting: Physician Assistant

## 2022-10-02 ENCOUNTER — Ambulatory Visit (HOSPITAL_COMMUNITY): Payer: Medicare HMO

## 2022-10-07 DIAGNOSIS — R55 Syncope and collapse: Secondary | ICD-10-CM

## 2022-10-07 DIAGNOSIS — S72002A Fracture of unspecified part of neck of left femur, initial encounter for closed fracture: Secondary | ICD-10-CM | POA: Diagnosis not present

## 2022-10-08 ENCOUNTER — Telehealth (HOSPITAL_COMMUNITY): Payer: Self-pay

## 2022-10-08 NOTE — Telephone Encounter (Signed)
Detailed instructions left on the patient's answering machine. Asked to call back with any questions. S.Davonne Jarnigan EMTP/CCT 

## 2022-10-09 DIAGNOSIS — N950 Postmenopausal bleeding: Secondary | ICD-10-CM | POA: Diagnosis not present

## 2022-10-10 ENCOUNTER — Other Ambulatory Visit: Payer: Self-pay | Admitting: Cardiology

## 2022-10-10 ENCOUNTER — Telehealth: Payer: Self-pay | Admitting: Cardiology

## 2022-10-10 DIAGNOSIS — I2089 Other forms of angina pectoris: Secondary | ICD-10-CM

## 2022-10-10 NOTE — Telephone Encounter (Signed)
Spoke with pt regarding her recent zio monitor. Pt did say that she reached out the company and they are providing her with a new monitor. Explained that shipping could take 3-5 business days. Advised pt that if she hasn't received monitor after 5 business days to call company back for update. Explained to pt that the new monitor has not yet been linked to her account so I could see it or track it yet. Pt verbalizes understanding.

## 2022-10-10 NOTE — Telephone Encounter (Signed)
Pt states she was wearing a zio monitor but it wasn't working so she sent it back today and she wants to know when will the new one come in.

## 2022-10-11 ENCOUNTER — Other Ambulatory Visit: Payer: Self-pay | Admitting: Cardiology

## 2022-10-11 ENCOUNTER — Telehealth: Payer: Self-pay | Admitting: *Deleted

## 2022-10-11 ENCOUNTER — Telehealth: Payer: Self-pay | Admitting: Cardiology

## 2022-10-11 MED ORDER — CLOPIDOGREL BISULFATE 75 MG PO TABS: ORAL_TABLET | ORAL | 3 refills | Status: DC

## 2022-10-11 NOTE — Telephone Encounter (Signed)
Medication refilled as requested, awaiting on reply from provider concerning testing

## 2022-10-11 NOTE — Telephone Encounter (Signed)
Added pre op to upcoming in office pt appointment.

## 2022-10-11 NOTE — Telephone Encounter (Signed)
   Pre-operative Risk Assessment    Patient Name: Michele [name]  DOB: July 19, 1955 MRN: [id number]      Request for Surgical Clearance    Procedure:   Hysteroscopy Dand C with Myosure  Date of Surgery:  Clearance 11/27/22                                 Surgeon:  Deboraha Sprang OB-GYN Surgeon's Group or Practice Name:  [facility] Phone number:  [phone] Fax number:  [phone]   Type of Clearance Requested:   - Medical  - Pharmacy:  Hold Clopidogrel (Plavix) Not Indicated   Type of Anesthesia:  Not Indicated   Additional requests/questions:    Signed, Emmit Pomfret   10/11/2022, 7:44 AM

## 2022-10-11 NOTE — Telephone Encounter (Signed)
Patient c/o Palpitations:  High priority if patient c/o lightheadedness, shortness of breath, or chest pain  How long have you had palpitations/irregular HR/ Afib? Are you having the symptoms now?   Are you currently experiencing lightheadedness, SOB or CP?   Do you have a history of afib (atrial fibrillation) or irregular heart rhythm?   Have you checked your BP or HR? (document readings if available):   Are you experiencing any other symptoms?    Patient complained of racing heart beats but call disconnected before patient provided information.  Patient also stated she will need a refill on her clopidogrel (PLAVIX) 75 MG tablet

## 2022-10-11 NOTE — Telephone Encounter (Signed)
Patient states she was in bed and felt he heart beating really fast when she checked her heart rate was at 140.  Currently her heart ate is reading 95 and her blood pressure is 129/74. She denies any chest pain or discomfort, shortness of breath, headache or dizziness. She was unable to complete heart monitor and is waiting for new monitor to be sent to her home. I tried to offer appointment but patient states she has a lot of appointments this month already and she does not have a car. I did  advise being that her heart rate is coming down and she is asymptomatic to continue to relax, stay hydrated and continue to monitor heart rate. If she develops chest pain or discomfort, shortness of breath, headache or dizziness and heart rate goes above 120 to call 911. Patient stated she hates to call  911 from [place] because they take her to the hospital in [place]. She is also concerned because she has stress test scheduled for Monday and states she does not want to do stress test with her heart rate up. Informed patient will forward to provider for further advice.

## 2022-10-11 NOTE — Telephone Encounter (Signed)
Spoke with patient and she is aware of provider recommendations. She verbalized understanding.

## 2022-10-11 NOTE — Telephone Encounter (Signed)
   Name: Michele [name]  DOB: 03/12/1956  MRN: [id number]  Primary Cardiologist: [name], MD  Chart reviewed as part of pre-operative protocol coverage. Because of [name]'s past medical history and time since last visit, she will require a follow-up in-office visit in order to better assess preoperative cardiovascular risk.  Pre-op covering staff: - Please schedule appointment and call patient to inform them. If patient already had an upcoming appointment within acceptable timeframe, please add "pre-op clearance" to the appointment notes so provider is aware. - Please contact requesting surgeon's office via preferred method (i.e, phone, fax) to inform them of need for appointment prior to surgery.  Plavix hold can be decided at the time of her in person appointment.  Sharlene Dory, PA-C  10/11/2022, 8:24 AM

## 2022-10-14 ENCOUNTER — Ambulatory Visit (HOSPITAL_COMMUNITY): Payer: Medicare HMO | Attending: Cardiovascular Disease

## 2022-10-14 DIAGNOSIS — I25119 Atherosclerotic heart disease of native coronary artery with unspecified angina pectoris: Secondary | ICD-10-CM | POA: Insufficient documentation

## 2022-10-14 DIAGNOSIS — R072 Precordial pain: Secondary | ICD-10-CM | POA: Diagnosis not present

## 2022-10-14 LAB — MYOCARDIAL PERFUSION IMAGING
Estimated workload: 1
Exercise duration (min): 1 min
Exercise duration (sec): 0 s
LV dias vol: 51 mL (ref 46–106)
LV sys vol: 18 mL
MPHR: 152 {beats}/min
Nuc Stress EF: 64 %
Peak HR: 108 {beats}/min
Percent HR: 71 %
Rest HR: 74 {beats}/min
Rest Nuclear Isotope Dose: 9.7 mCi
SDS: 2
SRS: 0
SSS: 2
ST Depression (mm): 0 mm
Stress Nuclear Isotope Dose: 31.6 mCi
TID: 1.03

## 2022-10-14 MED ORDER — TECHNETIUM TC 99M TETROFOSMIN IV KIT
31.6000 | PACK | Freq: Once | INTRAVENOUS | Status: AC | PRN
  Administered 2022-10-14: 31.6 via INTRAVENOUS

## 2022-10-14 MED ORDER — REGADENOSON 0.4 MG/5ML IV SOLN
0.4000 mg | Freq: Once | INTRAVENOUS | Status: AC
  Administered 2022-10-14: 0.4 mg via INTRAVENOUS

## 2022-10-14 MED ORDER — TECHNETIUM TC 99M TETROFOSMIN IV KIT
9.7000 | PACK | Freq: Once | INTRAVENOUS | Status: AC | PRN
  Administered 2022-10-14: 9.7 via INTRAVENOUS

## 2022-10-15 ENCOUNTER — Telehealth: Payer: Self-pay

## 2022-10-15 NOTE — Telephone Encounter (Signed)
-----   Message from Jonita Albee, [place] sent at 10/14/2022  5:19 PM EDT ----- Please tell patient that her stress test was normal, and there was no evidence of ischemia (blockages in blood vessels around her heart). There was also no evidence of infarction (scarring from past heart attacks). Pumping function of the heart was normal. Overall, the study was normal and low risk, which is excellent news!  Thanks! KJ

## 2022-10-15 NOTE — Telephone Encounter (Signed)
Patient is aware of stress test results and verbalized understanding.

## 2022-10-21 DIAGNOSIS — R1310 Dysphagia, unspecified: Secondary | ICD-10-CM | POA: Diagnosis not present

## 2022-10-21 DIAGNOSIS — K5900 Constipation, unspecified: Secondary | ICD-10-CM | POA: Diagnosis not present

## 2022-10-21 DIAGNOSIS — R1031 Right lower quadrant pain: Secondary | ICD-10-CM | POA: Diagnosis not present

## 2022-10-21 DIAGNOSIS — R110 Nausea: Secondary | ICD-10-CM | POA: Diagnosis not present

## 2022-10-22 ENCOUNTER — Telehealth: Payer: Self-pay | Admitting: *Deleted

## 2022-10-22 NOTE — Telephone Encounter (Signed)
   Name: Michele [name]  DOB: 11-Apr-1956  MRN: [id number]  Primary Cardiologist: [name], MD  Chart reviewed as part of pre-operative protocol coverage. The patient has an upcoming visit scheduled with Robet Leu, PA on 11/20/2022 at which time clearance can be addressed in case there are any issues that would impact surgical recommendations (pt will require clearance for 2 separate procedures, colonoscopy/EGD and D & C-see prior clearance request notes).  Colonoscopy/EGD is not scheduled until TBD as below. I added preop FYI to appointment note so that provider is aware to address at time of outpatient visit.  Per office protocol the cardiology provider should forward their finalized clearance decision and recommendations regarding antiplatelet therapy to the requesting party below.    I will route this message as FYI to requesting party and remove this message from the preop box as separate preop APP input not needed at this time.   Please call with any questions.  Joylene Grapes, NP  10/22/2022, 11:26 AM

## 2022-10-22 NOTE — Telephone Encounter (Signed)
   Pre-operative Risk Assessment    Patient Name: Michele [name]  DOB: 08/07/1955 MRN: [id number]      Request for Surgical Clearance    Procedure:   Colonoscopy/EGD  Date of Surgery:  Clearance TBD                                 Surgeon:  Dr. Neal Dy Group or Practice Name:  [facility] Phone number:  [phone] Fax number:  [phone]   Type of Clearance Requested:   - Medical  - Pharmacy:  Hold Clopidogrel (Plavix) 5-7 days prior.   Type of Anesthesia:  Not Indicated   Additional requests/questions:  Pt has tele appt scheduled 6/19 for another procedure.   Signed, Emmit Pomfret   10/22/2022, 9:19 AM

## 2022-10-23 ENCOUNTER — Ambulatory Visit: Payer: Medicare HMO | Attending: Cardiovascular Disease

## 2022-10-24 ENCOUNTER — Telehealth: Payer: Self-pay | Admitting: Cardiology

## 2022-10-24 NOTE — Telephone Encounter (Signed)
Patient stated she started wearing a heart monitor yesterday and is concerned if this will affect her upcoming procedure.

## 2022-10-24 NOTE — Telephone Encounter (Signed)
Spoke with pt regarding recently placed monitor and cardiac clearances that have been submitted by pt's OB/GYN and by her GI. Pt states she received a phone call this morning telling her that she had been given clearance for EGD/colonoscopy and could go ahead and hold plavix for upcoming procedure. Per chart I do not see where pt has been cleared from a cardiac standpoint. Both clearances state that pt is scheduled for office visit on 6/19 and clearance will be done at that time. Pt just had 3rd zio monitor placed yesterday and was under the impression that those results would also impact cardiac clearance. Re-confirmed that neither clearance has been completed and pt would need to wear monitor and come in for in office visit before cardiac clearance could be given. Pt will reach out to her GI to clarify. Reminded pt of appointment time, date and importance of this office visit. Pt states that she is so confused on how her GI doctor got this information. Advised that pt would have to clarify with that office. Pt verbalizes understanding.

## 2022-10-25 ENCOUNTER — Telehealth: Payer: Self-pay | Admitting: Pulmonary Disease

## 2022-10-25 NOTE — Telephone Encounter (Signed)
She needs office appointment with me or one of the NPs for preoperative respiratory assessment.

## 2022-10-25 NOTE — Telephone Encounter (Signed)
Fax received from Dr. Steva Ready with [facility] to perform a Hysteroscopy D and C Myosure on patient.  Patient needs surgery clearance. Surgery is 11/27/22. Patient was seen on 08/26/22. Office protocol is a risk assessment can be sent to surgeon if patient has been seen in 60 days or less.   Sending to Dr. Craige Cotta for risk assessment or recommendations if patient needs to be seen in office prior to surgical procedure.

## 2022-10-29 NOTE — Telephone Encounter (Signed)
Pt states she is confused why she has received another heart monitor. She states she just finished wearing one and sent it off but she received a different one.

## 2022-10-29 NOTE — Telephone Encounter (Signed)
Patient aware she does not need to repeat monitor and will send it back

## 2022-10-29 NOTE — Telephone Encounter (Signed)
Patient states she just completed a monitor and sent it in today. She received new monitor in the mail. She would like to know if you ordered her a new monitor. I did inform her I did not see any notes of that. Did she need restart heart monitor

## 2022-10-31 DIAGNOSIS — J449 Chronic obstructive pulmonary disease, unspecified: Secondary | ICD-10-CM | POA: Diagnosis not present

## 2022-10-31 DIAGNOSIS — I5032 Chronic diastolic (congestive) heart failure: Secondary | ICD-10-CM | POA: Diagnosis not present

## 2022-10-31 DIAGNOSIS — I2510 Atherosclerotic heart disease of native coronary artery without angina pectoris: Secondary | ICD-10-CM | POA: Diagnosis not present

## 2022-10-31 DIAGNOSIS — K219 Gastro-esophageal reflux disease without esophagitis: Secondary | ICD-10-CM | POA: Diagnosis not present

## 2022-10-31 DIAGNOSIS — E785 Hyperlipidemia, unspecified: Secondary | ICD-10-CM | POA: Diagnosis not present

## 2022-11-01 DIAGNOSIS — R55 Syncope and collapse: Secondary | ICD-10-CM | POA: Diagnosis not present

## 2022-11-11 ENCOUNTER — Telehealth: Payer: Self-pay | Admitting: Cardiology

## 2022-11-11 ENCOUNTER — Telehealth: Payer: Self-pay | Admitting: Pulmonary Disease

## 2022-11-11 NOTE — Telephone Encounter (Signed)
Fax received from Dr. Loman Chroman with [facility] to perform a Colonoscopy/EGD on patient.  Patient needs surgery clearance. Surgery is Pending. Patient was seen on 08/25/32. Office protocol is a risk assessment can be sent to surgeon if patient has been seen in 60 days or less.   Sending to Dr. Craige Cotta for risk assessment or recommendations if patient needs to be seen in office prior to surgical procedure.    Dr. Craige Cotta Patient has apt on 11/21/22

## 2022-11-11 NOTE — Telephone Encounter (Signed)
Called pt to let her know the monitor has not been reviewed and released yet. She verbalized understanding. She was concerned about this being the correct monitor.

## 2022-11-11 NOTE — Telephone Encounter (Signed)
Patient is requesting a call back to discuss monitor results. 

## 2022-11-11 NOTE — Telephone Encounter (Signed)
Since she already has an appointment scheduled with me on 11/21/22 I can address this at her appointment.

## 2022-11-12 ENCOUNTER — Telehealth: Payer: Self-pay | Admitting: Pulmonary Disease

## 2022-11-12 NOTE — Telephone Encounter (Signed)
Called the patient and she is worried about the monitors that were sent in, she said that there was a monitor that was defective, she had to go to [facility] to get the monitor placed. She wanted to make sure that the right monitors were sent.  I told her that when I looked at the monitor info- it looks like there are 2 monitors with 8 days and 21 hours worth of information.  Just waiting on the provider to review and interpret results, then we will call with results and recommendations.

## 2022-11-12 NOTE — Telephone Encounter (Signed)
Patient is requesting return call to discuss the monitors and if she sent in the correct one for results.

## 2022-11-13 ENCOUNTER — Telehealth: Payer: Self-pay | Admitting: Pulmonary Disease

## 2022-11-13 ENCOUNTER — Telehealth: Payer: Self-pay | Admitting: Emergency Medicine

## 2022-11-13 DIAGNOSIS — J4489 Other specified chronic obstructive pulmonary disease: Secondary | ICD-10-CM

## 2022-11-13 DIAGNOSIS — J439 Emphysema, unspecified: Secondary | ICD-10-CM

## 2022-11-13 NOTE — Telephone Encounter (Signed)
Called the patient and left results over voicemail and also released them to mychart. Told to call or message with any questions/concerns. Left call back number

## 2022-11-13 NOTE — Telephone Encounter (Signed)
Patient ask about "being put to sleep" She is asking if preop clearance form was received and completed for endoscopy /colonoscopy.  Advised I would send a message to see if complete. Please advise

## 2022-11-13 NOTE — Telephone Encounter (Signed)
-----   Message from Jonita Albee, [place] sent at 11/13/2022  7:46 AM EDT ----- Please tell patient the results of her cardiac monitor:  "Overall, as is the case with the last 3 studies, this is a benign study.  No arrhythmias noted.  The patient is having symptomatic PACs more than PVCs (extra beats)    Treatment has been limited because low blood pressures.  Whenever we tried to use a medicine to treat these symptoms, her blood pressure went down.  They are benign and not likely to cause any problems.  Would prefer to avoid treating."

## 2022-11-13 NOTE — Telephone Encounter (Signed)
Dr's office called about fax zent for Sx Clearance. I let them know Dr.'s reply herein.

## 2022-11-13 NOTE — Telephone Encounter (Signed)
Patient is returning call and is requesting return call.  

## 2022-11-13 NOTE — Telephone Encounter (Signed)
levalbuterol (XOPENEX) 0.63 MG/3ML nebulizer solution [[id number]]   Pt calling in to get her prescription sent in to the [facility] on [address]

## 2022-11-14 NOTE — Telephone Encounter (Signed)
Monitor read.  Results are in results section. Pretty normal.  Patient notes sinus rhythm with PACs.  No notable arrhythmias.  We have had issues with PACs and ectopy in the past.  Blood pressure does not usually tolerate with her being on a beta-blocker.  This is the same thing we have seen on multiple different tests.  [name], MD

## 2022-11-14 NOTE — Telephone Encounter (Signed)
Patient is calling again regarding her prescription refills.  She stated that the pharmacy does not have them.  Please call patient to discuss further.  CB# [phone]

## 2022-11-15 MED ORDER — LEVALBUTEROL HCL 0.63 MG/3ML IN NEBU: 0.6300 mg | INHALATION_SOLUTION | RESPIRATORY_TRACT | 1 refills | Status: DC | PRN

## 2022-11-15 MED ORDER — ARNUITY ELLIPTA 100 MCG/ACT IN AEPB: 1.0000 | INHALATION_SPRAY | Freq: Every day | RESPIRATORY_TRACT | 5 refills | Status: DC

## 2022-11-15 NOTE — Telephone Encounter (Signed)
Called and spoke with patient. She is aware that the RXs have been sent in for her. Nothing further needed at time of call.

## 2022-11-15 NOTE — Telephone Encounter (Signed)
PT calling again stating Pharm has fax'd over several req for refills of the following:  ARNUITY ELLIPTA 100 MCG/ACT   Levalbuterol  PT highly upset no calls back or call in on this script. Please call pt to advise action taken once scripts have been requested.  [facility] on [address] and [address]

## 2022-11-17 NOTE — Progress Notes (Unsigned)
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 stress test from 10/14/22 was a normal, low risk study without evidence of ischemia  - Patient does have multiple cardiac conditions that increase her perioperative risk including CAD, diastolic CHF, ongoing tobacco use, COPD. However, recent echo and nuclear stress test were reassuring. She is euvolemic on exam. No further cardiac evaluation necessary prior to surgery. Discussed this with patient who voiced understanding  - Patient has been on plavix monotherapy due to ASA intolerance. OK to hold plavix 5-7 days prior to EDG/colonoscopy as requested. No need to hold plavix prior to [facility] per pre-op request   CAD Atypical Chest pain  - Patient previously had STEMI and 10/2011 that was treated with overlapping DES x 2 to the left circumflex.  1 month later, underwent staged PCI with DES to mid RCA.  In 11/2012, patient had severe in-stent restenosis in previously placed stent in the left circumflex and was treated with PCI and DES.  Most recent cardiac  catheterization from 02/2013 showed patent mid RCA stent, diffuse in-stent restenosis that has been managed medically  - When seen in clinic in 09/2022, patient complained of atypical chest pain. Nuclear stress test from 10/2022 was a low risk study without evidence of ischemia.  - Discussed with patient that these results are very reassuring, and she should follow up with her PCP to discuss noncardiac causes of chest pain (anxiety, GI, severe emphysema) - Today, patient complained of pain with swallowing and epigastric pain when eating. Suspect chest pain could be related to GI etiology- ordered protonix  40 mg daily - Continue Plavix (patient is intolerant of ASA), repatha, imdur, pravastatin, Vascepa    Palpitations  PACs  - At clinic appointment in 09/2022, patient complained of palpitations. Outpatient monitor showed sinus rhythm with occasional PACs (2.6-3.5% burden) and rare PVCs - In the past, treatment has been limited due to low BP - Per Dr. [name], whenever we have tried to start medications to treat palpitations, her BP went down. The PACs are benign and not likely to cause any problems, so would prefer to avoid treating. Discussed with patient who voiced understanding   Abdominal aorta atherosclerosis  Bilateral iliac artery stenosis Bilateral Lower Extremity Pain  -  Ultrasound of Aorta/IVC/Iliacs from 06/28/22 showed no evidence of abdominal aortic aneurysm. No evidence of significant stenosis in the iliac arteries  - Recent bilateral ABIs from 06/2022 showed normal resting ABIs bilaterally - Continue plavix and pravastatin/repatha  - Encouraged tobacco cessation  - Suspect lower extremity pain is due to her history of hip fracture and bilateral knee arthritis. Encouraged PCP/ortho follow up   Chronic Venous Insufficiency  Chronic Diastolic Heart Failure  - Lower extremity venous ultrasounds from 06/2022 showed no evidence of DVT in bilateral lower extremities, but did show evidence  of venous reflux - Echocardiogram from 07/2022 showed EF 60-65% with grade 1 DD - She is euvolemic on exam today  - Encouraged compression stockings and elevated lower extremities  - Continue lasix 20 mg PRN for ankle edema, weight gain   HTN  - BP well controlled on current medications   HLD  - Lipid panel from 06/2022 showed LDL 39, HDL 44, triglycerides 100, total cholesterol 102  - Patient is unable to tolerate high intensity statins  - Continue reptha, pravastatin, vascepa   Tobacco Use - Counseled on tobacco use   Medication Adjustments/Labs and Tests Ordered: Current medicines are reviewed at length with the patient today.  Concerns regarding medicines are outlined above.  Orders Placed This Encounter  Procedures   EKG 12-Lead   Meds ordered this encounter  Medications   pantoprazole (PROTONIX) 40 MG tablet    Sig: Take 1 tablet (40 mg total) by mouth daily.    Dispense:  90 tablet    Refill:  2    Patient Instructions  Medication Instructions:  Start Protonix 40 mg daily  *If you need a refill on your cardiac medications before your next appointment, please call your pharmacy*   Lab Work: NONE ordered at this time of appointment     Testing/Procedures: NONE ordered at this time of appointment    Follow-Up: At [facility], you and your health needs are our priority.  As part of our continuing mission to provide you with exceptional heart care, we have created designated Provider Care Teams.  These Care Teams include your primary Cardiologist (physician) and Advanced Practice Providers (APPs -  Physician Assistants and Nurse Practitioners) who all work together to provide you with the care you need, when you need it.  We recommend signing up for the patient portal called "MyChart".  Sign up information is provided on this After Visit Summary.  MyChart is used to connect with patients for Virtual Visits (Telemedicine).  Patients are able to view lab/test  results, encounter notes, upcoming appointments, etc.  Non-urgent messages can be sent to your provider as well.   To learn more about what you can do with MyChart, go to [email or web address].    Your next appointment:   6 month(s)  Provider:  [name], MD  or Robet Leu, PA-C        Other Instructions    Signed, Jonita Albee, PA-C  11/20/2022 12:30 PM    [facility]

## 2022-11-20 ENCOUNTER — Encounter: Payer: Self-pay | Admitting: Cardiology

## 2022-11-20 ENCOUNTER — Ambulatory Visit: Payer: Medicare HMO | Attending: Cardiology | Admitting: Cardiology

## 2022-11-20 VITALS — BP 126/78 | HR 74 | Ht 65.5 in | Wt 134.0 lb

## 2022-11-20 DIAGNOSIS — M79605 Pain in left leg: Secondary | ICD-10-CM

## 2022-11-20 DIAGNOSIS — M79604 Pain in right leg: Secondary | ICD-10-CM

## 2022-11-20 DIAGNOSIS — I872 Venous insufficiency (chronic) (peripheral): Secondary | ICD-10-CM | POA: Diagnosis not present

## 2022-11-20 DIAGNOSIS — E785 Hyperlipidemia, unspecified: Secondary | ICD-10-CM

## 2022-11-20 DIAGNOSIS — I700 Atherosclerosis of aorta: Secondary | ICD-10-CM

## 2022-11-20 DIAGNOSIS — I5032 Chronic diastolic (congestive) heart failure: Secondary | ICD-10-CM | POA: Diagnosis not present

## 2022-11-20 DIAGNOSIS — Z01818 Encounter for other preprocedural examination: Secondary | ICD-10-CM | POA: Diagnosis not present

## 2022-11-20 DIAGNOSIS — I25119 Atherosclerotic heart disease of native coronary artery with unspecified angina pectoris: Secondary | ICD-10-CM

## 2022-11-20 DIAGNOSIS — Z720 Tobacco use: Secondary | ICD-10-CM

## 2022-11-20 DIAGNOSIS — I10 Essential (primary) hypertension: Secondary | ICD-10-CM

## 2022-11-20 DIAGNOSIS — I771 Stricture of artery: Secondary | ICD-10-CM | POA: Diagnosis not present

## 2022-11-20 MED ORDER — PANTOPRAZOLE SODIUM 40 MG PO TBEC: 40.0000 mg | DELAYED_RELEASE_TABLET | Freq: Every day | ORAL | 2 refills | Status: AC

## 2022-11-20 NOTE — Patient Instructions (Signed)
Medication Instructions:  Start Protonix 40 mg daily  *If you need a refill on your cardiac medications before your next appointment, please call your pharmacy*   Lab Work: NONE ordered at this time of appointment     Testing/Procedures: NONE ordered at this time of appointment    Follow-Up: At [facility], you and your health needs are our priority.  As part of our continuing mission to provide you with exceptional heart care, we have created designated Provider Care Teams.  These Care Teams include your primary Cardiologist (physician) and Advanced Practice Providers (APPs -  Physician Assistants and Nurse Practitioners) who all work together to provide you with the care you need, when you need it.  We recommend signing up for the patient portal called "MyChart".  Sign up information is provided on this After Visit Summary.  MyChart is used to connect with patients for Virtual Visits (Telemedicine).  Patients are able to view lab/test results, encounter notes, upcoming appointments, etc.  Non-urgent messages can be sent to your provider as well.   To learn more about what you can do with MyChart, go to [email or web address].    Your next appointment:   6 month(s)  Provider:   [name], MD  or Robet Leu, PA-C        Other Instructions

## 2022-11-20 NOTE — Telephone Encounter (Signed)
Please see phone note dated 11/13/22- the RF pt requested has been sent.

## 2022-11-20 NOTE — Telephone Encounter (Signed)
Spoke to [place] , patient resting.  Patient had an appointment today .  Informed Maisie Fus of Dr [name] response to  monitor. Maisie Fus states he will let patient know.

## 2022-11-21 ENCOUNTER — Ambulatory Visit (HOSPITAL_BASED_OUTPATIENT_CLINIC_OR_DEPARTMENT_OTHER): Payer: Medicare HMO

## 2022-11-21 ENCOUNTER — Ambulatory Visit (INDEPENDENT_AMBULATORY_CARE_PROVIDER_SITE_OTHER): Payer: Medicare HMO | Admitting: Pulmonary Disease

## 2022-11-21 ENCOUNTER — Encounter (HOSPITAL_BASED_OUTPATIENT_CLINIC_OR_DEPARTMENT_OTHER): Payer: Self-pay | Admitting: Pulmonary Disease

## 2022-11-21 VITALS — BP 126/68 | HR 82 | Ht 65.5 in | Wt 134.0 lb

## 2022-11-21 DIAGNOSIS — R042 Hemoptysis: Secondary | ICD-10-CM | POA: Diagnosis not present

## 2022-11-21 DIAGNOSIS — Z716 Tobacco abuse counseling: Secondary | ICD-10-CM

## 2022-11-21 DIAGNOSIS — R053 Chronic cough: Secondary | ICD-10-CM | POA: Diagnosis not present

## 2022-11-21 DIAGNOSIS — R059 Cough, unspecified: Secondary | ICD-10-CM | POA: Diagnosis not present

## 2022-11-21 DIAGNOSIS — J432 Centrilobular emphysema: Secondary | ICD-10-CM | POA: Diagnosis not present

## 2022-11-21 DIAGNOSIS — J439 Emphysema, unspecified: Secondary | ICD-10-CM | POA: Diagnosis not present

## 2022-11-21 DIAGNOSIS — Z01811 Encounter for preprocedural respiratory examination: Secondary | ICD-10-CM | POA: Diagnosis not present

## 2022-11-21 DIAGNOSIS — J449 Chronic obstructive pulmonary disease, unspecified: Secondary | ICD-10-CM | POA: Diagnosis not present

## 2022-11-21 NOTE — Patient Instructions (Signed)
Chest xray today, and will call you with results  Follow up in 4 months

## 2022-11-21 NOTE — Progress Notes (Signed)
[facility]  Chief Complaint  Patient presents with   Follow-up    Surgical clearance for endoscopy/colonoscopy. States her breathing has been stable since last visit. Productive cough with clear phlegm.     Past Surgical History:  She  has a past surgical history that includes Leg wound repair / closure (1610); Coronary angioplasty with stent (10/10/2011); Coronary angioplasty with stent (11/06/2011); Tonsillectomy; Tubal ligation (1970's); Knee surgery; Coronary angioplasty with stent (11/19/2012); NM MYOVIEW LTD (October 2013; 12/2013); CPET (09/07/2012); left heart catheterization with coronary angiogram (N/A, 10/10/2011); percutaneous coronary stent intervention (pci-s) (N/A, 11/06/2011); left heart catheterization with coronary angiogram (N/A, 11/19/2012); left heart catheterization with coronary angiogram (N/A, 02/19/2013); left heart catheterization with coronary angiogram (N/A, 03/02/2014); Colonoscopy; Polypectomy; Video bronchoscopy with endobronchial ultrasound (N/A, 02/14/2016); Direct laryngoscopy (N/A, 02/14/2016); OTHER SURGICAL HISTORY; Breast biopsy (2000's); NM MYOVIEW LTD (02/2016); lipoma surgery (Left, 10/2016); transthoracic echocardiogram (08/30/2017); Esophagogastroduodenoscopy (egd) with propofol (N/A, 10/29/2018); EVENT MONITOR (03/2019); and transthoracic echocardiogram (02/02/2021).  Past Medical History:  Vitamin D deficiency, Seizures, RLS, Chronic back pain, HTN, Hiatal hernia, GERD, HLD, Palpitations, Depression, CAD, Cataracts, OA, Anxiety, COVID 22 May 2019  Constitutional:  BP 126/68   Pulse 82   Ht 5' 5.5" (1.664 m)   Wt 134 lb (60.8 kg)   SpO2 97% Comment: on RA  BMI 21.96 kg/m   Brief Summary:  Michele [name] is a 67 y.o. female smoker with COPD/emphysema.  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      Subjective:   She is here with her husband.  She had GI bleeding.  She is  scheduled for EGD and colonoscopy by Dr. Loman Chroman with [facility].  She was also supposed to have a Pap smear, but wasn't able to tolerate the procedure.  She is to be scheduled for a D and C with Dr. Steva Ready.  She continues to smoke 1 ppd.  She has more cough with thicker sputum.  Sometimes has blood in sputum.  Pain in his sides.  No wheeze, or fever.    CT chest from 09/26/22 was stable.  Physical Exam:   Appearance - well kempt, sitting in a wheelchair  ENMT - no sinus tenderness, no oral exudate, no LAN, Mallampati 2 airway, no stridor  Respiratory - equal breath sounds bilaterally, no wheezing or rales  CV - s1s2 regular rate and rhythm, no murmurs  Ext - no clubbing, no edema  Skin - no rashes  Psych - normal mood and affect   Pulmonary testing:  PFT 12/23/11 >> FEV1 2.13 (85%), FEV1% 59, TLC 5.98 (112%), DLCO 60% Spirometry 11/19/17 >> FEV1 2.58 (100%), FEV1% 76 FeNO 11/19/17 >> 5  Chest Imaging:  CT chest 08/24/16 >> CAD, mod/severe emphysema CT chest 08/29/17 >> severe emphysema CT chest 03/02/18 >> severe centrilobular and paraseptal emphysema, 4 mm nodule RUL partially calcified CT angio chest 04/27/18 >> atherosclerosis, small/mod hiatal hernia, dilation of esophagus with mucosal thickening, severe emphysema with large bulla, bronchiectasis CT chest 09/02/19 >> severe upper lobe predominant emphysema CT chest 09/25/21 >> atherosclerosis, small HH, progressive emphysema, XRT changes to Rt hilum  Cardiac Tests:  Echo 07/10/22 >> EF 60 to 65%, grade 1 DD  Social History:  She  reports that she has been smoking cigarettes. She has a 80.00 pack-year smoking history. She has never used smokeless tobacco. She reports that she does not drink alcohol and does not use drugs.  Family History:  Her family history includes Asthma in her mother; Cancer in her maternal grandmother; Colon polyps in her mother; Diabetes in her mother; Emphysema in her mother;  Heart attack in her maternal grandfather; Heart disease in her father and mother; Hyperlipidemia in her mother; Hypertension in her mother; Kidney cancer in her brother; Stomach cancer in her brother and brother; Stroke in her mother; Stroke (age of onset: 37) in her brother; Thyroid cancer in her daughter.     Assessment/Plan:   COPD with chronic bronchitis, emphysema, and bronchiectasis. - she is intolerant of LABA's and albuterol due to palpitations - continue spiriva, and arnuity - continue mucinex and flutter valve - prn xopenex  Hemoptysis. - described more as blood streaking in phlegm - chest xray today  Tobacco abuse. 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 Si Gaul with oncology - follow up CT chest with oncology  CAD s/p stents. - followed by Dr. [name] with cardiology  Preoperative respiratory assessment. - if her chest xray is unremarkable, then she can proceed with EGD/colonoscopy and with D and C  Time Spent Involved in Patient Care on Day of Examination:  36 minutes  Follow up:   Patient Instructions  Chest xray today, and will call you with results  Follow up in 4 months  Medication List:   Allergies as of 11/21/2022       Reactions   Ciprofloxacin Other (See Comments)   Hypoglycemia    Aspirin Other (See Comments)   GI upset; patient is only able to take enteric coated Aspirin.     Crestor [rosuvastatin] Other (See Comments)   Muscle pain    Ibuprofen Other (See Comments)   GI upset    Wellbutrin [bupropion] Palpitations   Amitiza [lubiprostone] Other (See Comments)   Abdominal pain   Penicillin G    Prednisone Other (See Comments)   Heart palpations   Zoloft [sertraline Hcl] Other (See Comments)   Insomnia, fatigue    Albuterol Palpitations   Lipitor [atorvastatin] Other (See Comments)    Muscle pain    Sulfonamide Derivatives Itching, Rash        Medication List        Accurate as of November 21, 2022 11:42 AM. If you have any questions, ask your nurse or doctor.          acetaminophen 325 MG tablet Commonly known as: TYLENOL Take 2 tablets (650 mg total) by mouth every 6 (six) hours as needed for mild pain, fever or headache.   ALPRAZolam 1 MG tablet Commonly known as: XANAX Take 1 mg by mouth as needed.   Arnuity Ellipta 100 MCG/ACT Aepb Generic drug: Fluticasone Furoate Take 1 puff by mouth daily.   Centrum Silver Ultra Womens Tabs See admin instructions.   CITRACAL + D PO Take 1 tablet by mouth in the morning and at bedtime.   clopidogrel 75 MG tablet Commonly known as: PLAVIX TAKE 1 TABLET BY MOUTH EVERY MONDAY, WEDNESDAY AND FRIDAY   co-enzyme Q-10 30 MG capsule Take 30 mg by mouth daily.   fluticasone 50 MCG/ACT nasal spray Commonly known as: FLONASE INSTILL 1 SPRAY INTO BOTH NOSTRILS DAILY   Flutter Devi 1 application by Does not apply route 2 (two) times daily.   guaiFENesin 600 MG 12 hr tablet Commonly known as: Mucinex Take 1 tablet (600 mg total) by mouth  2 (two) times daily.   isosorbide mononitrate 30 MG 24 hr tablet Commonly known as: IMDUR TAKE 1 TABLET(30 MG) BY MOUTH AT BEDTIME   levalbuterol 0.63 MG/3ML nebulizer solution Commonly known as: Xopenex Take 3 mLs (0.63 mg total) by nebulization every 4 (four) hours as needed for wheezing or shortness of breath (dx: R00.2, J44.9).   levalbuterol 45 MCG/ACT inhaler Commonly known as: XOPENEX HFA Inhale 2 puffs into the lungs every 6 (six) hours as needed for wheezing.   nitroGLYCERIN 0.4 MG SL tablet Commonly known as: NITROSTAT PLACE 1 TABLET UNDER TONGUE EVERY 5 MINUTES AS NEEDED FOR CHEST PAIN   pantoprazole 40 MG tablet Commonly known as: Protonix Take 1 tablet (40 mg total) by mouth daily.   polyethylene glycol 17 g packet Commonly known as: MIRALAX /  GLYCOLAX Take 17 g by mouth daily as needed for mild constipation.   pravastatin 80 MG tablet Commonly known as: PRAVACHOL Take 1 tablet by mouth every Monday, Wednesday, Friday   Repatha SureClick 140 MG/ML Soaj Generic drug: Evolocumab Inject 140 mg into the skin every 14 (fourteen) days.   Restasis 0.05 % ophthalmic emulsion Generic drug: cycloSPORINE Place 1 drop into both eyes 2 (two) times daily.   sodium chloride HYPERTONIC 3 % nebulizer solution Take by nebulization as needed for cough.   Spiriva HandiHaler 18 MCG inhalation capsule Generic drug: tiotropium Place 1 capsule (18 mcg total) into inhaler and inhale daily.   Vascepa 1 g capsule Generic drug: icosapent Ethyl TAKE 1 CAPSULE BY MOUTH TWICE DAILY        Signature:  Coralyn Helling, MD [facility] Pager - [phone] 11/21/2022, 11:42 AM

## 2022-11-26 ENCOUNTER — Telehealth (HOSPITAL_BASED_OUTPATIENT_CLINIC_OR_DEPARTMENT_OTHER): Payer: Self-pay | Admitting: Pulmonary Disease

## 2022-11-26 NOTE — Telephone Encounter (Signed)
She is cleared for both procedures.

## 2022-11-26 NOTE — Telephone Encounter (Signed)
Patient wanting to know if Dr. Craige Cotta cleared her for both surgeries for [facility] Hysteroscopy on 6/26 and Colonoscopy with Dr. Angelina Sheriff (as of now still pending). Please advise.

## 2022-11-26 NOTE — Telephone Encounter (Signed)
Dr. Craige Cotta has patient been cleared for both surgeries?

## 2022-11-27 ENCOUNTER — Ambulatory Visit: Admit: 2022-11-27 | Payer: Medicare HMO | Admitting: Obstetrics and Gynecology

## 2022-11-27 SURGERY — DILATATION & CURETTAGE/HYSTEROSCOPY WITH MYOSURE
Anesthesia: Choice

## 2022-11-28 ENCOUNTER — Telehealth: Payer: Self-pay | Admitting: Cardiology

## 2022-11-28 ENCOUNTER — Telehealth: Payer: Self-pay | Admitting: Pulmonary Disease

## 2022-11-28 NOTE — Telephone Encounter (Signed)
Closing encounter since clearance have been faxed back

## 2022-11-28 NOTE — Telephone Encounter (Signed)
[facility] calling for SX clearance. Please fax# [phone] [facility]

## 2022-11-28 NOTE — Telephone Encounter (Signed)
Clearance faxed back to [facility]

## 2022-11-28 NOTE — Telephone Encounter (Signed)
[facility] calling back for clearance f/u. Please advise.

## 2022-11-28 NOTE — Telephone Encounter (Signed)
OV notes and clearance form have been faxed back to [facility]. Nothing further needed at this time.

## 2022-11-28 NOTE — Telephone Encounter (Signed)
I s/w [place] with [facility] in regard to clearance for ht ept. I informed her that Robet Leu, [place] for Dr. [name] saw the pt on 11/20/22 and cleared her. I will re-fax the ov notes providing clearance. I did read the section for the procedure for [facility] no need to hold meds.   Pt has 2 procedures that she was cleared for and with 2 different offices.   I have faxed notes to [facility] fax# [phone]

## 2022-11-28 NOTE — Telephone Encounter (Signed)
Clearance has been faxed back as of this morning. Closing encounter. NFN

## 2022-12-04 ENCOUNTER — Telehealth: Payer: Self-pay | Admitting: Internal Medicine

## 2022-12-04 ENCOUNTER — Telehealth (HOSPITAL_BASED_OUTPATIENT_CLINIC_OR_DEPARTMENT_OTHER): Payer: Self-pay

## 2022-12-04 NOTE — Telephone Encounter (Signed)
Patient aware of upcoming appointment times/dates 

## 2022-12-16 ENCOUNTER — Inpatient Hospital Stay: Payer: Medicare HMO | Attending: Internal Medicine | Admitting: Internal Medicine

## 2022-12-16 VITALS — BP 124/67 | HR 87 | Temp 97.5°F | Resp 18 | Wt 135.4 lb

## 2022-12-16 DIAGNOSIS — Z8049 Family history of malignant neoplasm of other genital organs: Secondary | ICD-10-CM | POA: Diagnosis not present

## 2022-12-16 DIAGNOSIS — Z800 Family history of malignant neoplasm of digestive organs: Secondary | ICD-10-CM | POA: Diagnosis not present

## 2022-12-16 DIAGNOSIS — G629 Polyneuropathy, unspecified: Secondary | ICD-10-CM | POA: Diagnosis not present

## 2022-12-16 DIAGNOSIS — Z8051 Family history of malignant neoplasm of kidney: Secondary | ICD-10-CM | POA: Insufficient documentation

## 2022-12-16 DIAGNOSIS — Z808 Family history of malignant neoplasm of other organs or systems: Secondary | ICD-10-CM | POA: Diagnosis not present

## 2022-12-16 DIAGNOSIS — R413 Other amnesia: Secondary | ICD-10-CM | POA: Insufficient documentation

## 2022-12-16 DIAGNOSIS — R270 Ataxia, unspecified: Secondary | ICD-10-CM | POA: Diagnosis not present

## 2022-12-16 DIAGNOSIS — Z85118 Personal history of other malignant neoplasm of bronchus and lung: Secondary | ICD-10-CM | POA: Insufficient documentation

## 2022-12-16 DIAGNOSIS — F17210 Nicotine dependence, cigarettes, uncomplicated: Secondary | ICD-10-CM | POA: Diagnosis not present

## 2022-12-16 DIAGNOSIS — M5416 Radiculopathy, lumbar region: Secondary | ICD-10-CM | POA: Insufficient documentation

## 2022-12-16 DIAGNOSIS — G4700 Insomnia, unspecified: Secondary | ICD-10-CM | POA: Insufficient documentation

## 2022-12-16 DIAGNOSIS — R519 Headache, unspecified: Secondary | ICD-10-CM | POA: Insufficient documentation
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
## 2022-12-19 ENCOUNTER — Telehealth: Payer: Self-pay | Admitting: Cardiology

## 2022-12-19 NOTE — Telephone Encounter (Signed)
Spoke to the patient, today she may have taken double the dosage of Plavix about an hour ago. Pt stated she is supposed to take Plavix on M,W,F. Pt stated  she has a pill box is labeled, she found an extra pill in the box and took it. Advise pt to call PCP, to report the event. Pt replied " I am not sure if I have taken an extra Plavix". Pt stated she sure that she did not take an extra dose of her other medications. Pt is currently asymptomatic, will forward to MD and Pharm D for advised.

## 2022-12-19 NOTE — Telephone Encounter (Signed)
Not a concern -- especially since only taking MWF.  [name], MD

## 2022-12-19 NOTE — Telephone Encounter (Signed)
Pt c/o medication issue:  1. Name of Medication: clopidogrel (PLAVIX) 75 MG tablet   2. How are you currently taking this medication (dosage and times per day)?   3. Are you having a reaction (difficulty breathing--STAT)?   4. What is your medication issue? Patient believes she may have doubled the dose today, but is unsure and would like to speak with someone in regards to this.

## 2022-12-19 NOTE — Telephone Encounter (Signed)
Spoke to patient, she is not 100% sure that she took 2 this morning. Advised her to go back to her regular schedule from tomorrow taking Plavix 3 times per week.

## 2022-12-20 NOTE — Telephone Encounter (Signed)
Spoke to the patient, explained Dr Michele [name] advise:  Not a concern -- especially since only taking MWF.   Patient voiced understanding.

## 2022-12-22 ENCOUNTER — Encounter: Payer: Self-pay | Admitting: Internal Medicine

## 2023-01-10 IMAGING — DX DG CHEST 2V
2 series · 2 of 2 positions shown · non-contrast
Comparison: March 24, 2020.

CLINICAL DATA: Hemoptysis.  Chest pain.

EXAM:
CHEST - 2 VIEW

[chest pa]
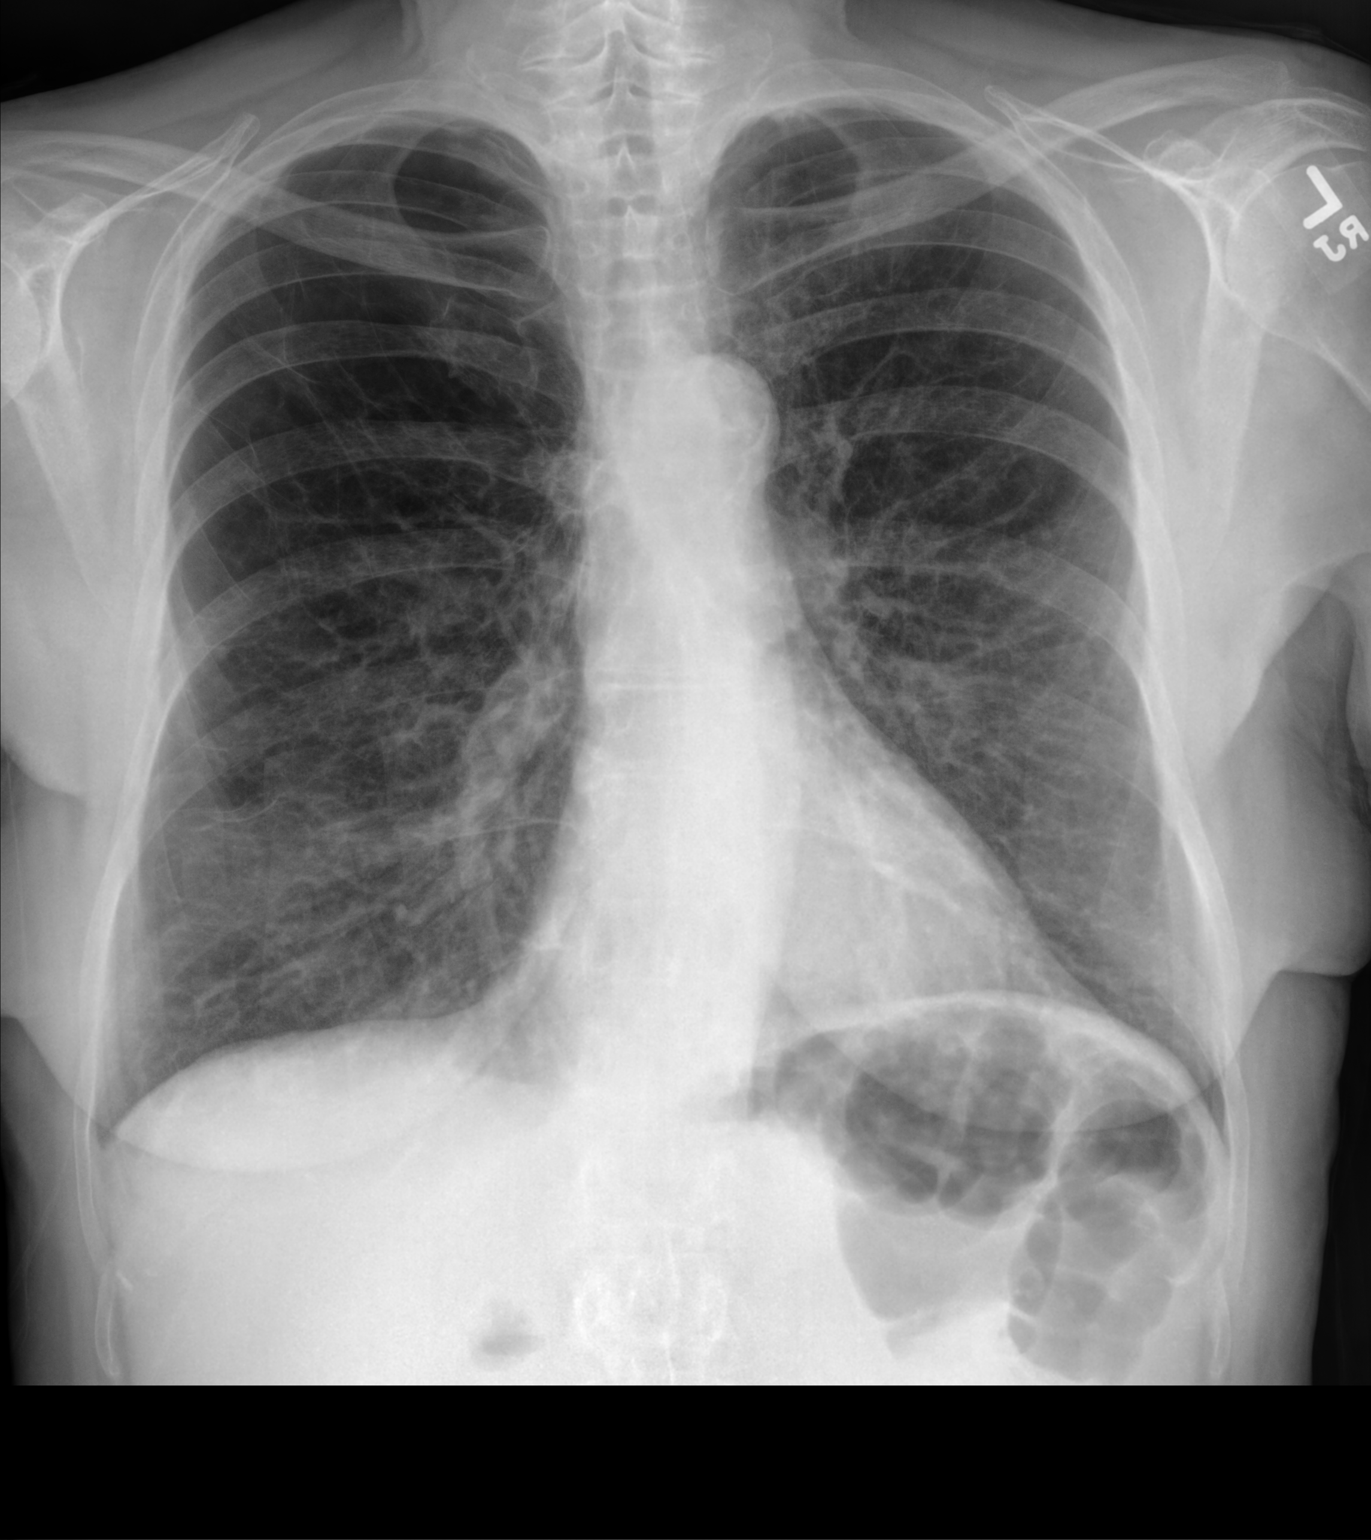

[chest lat]
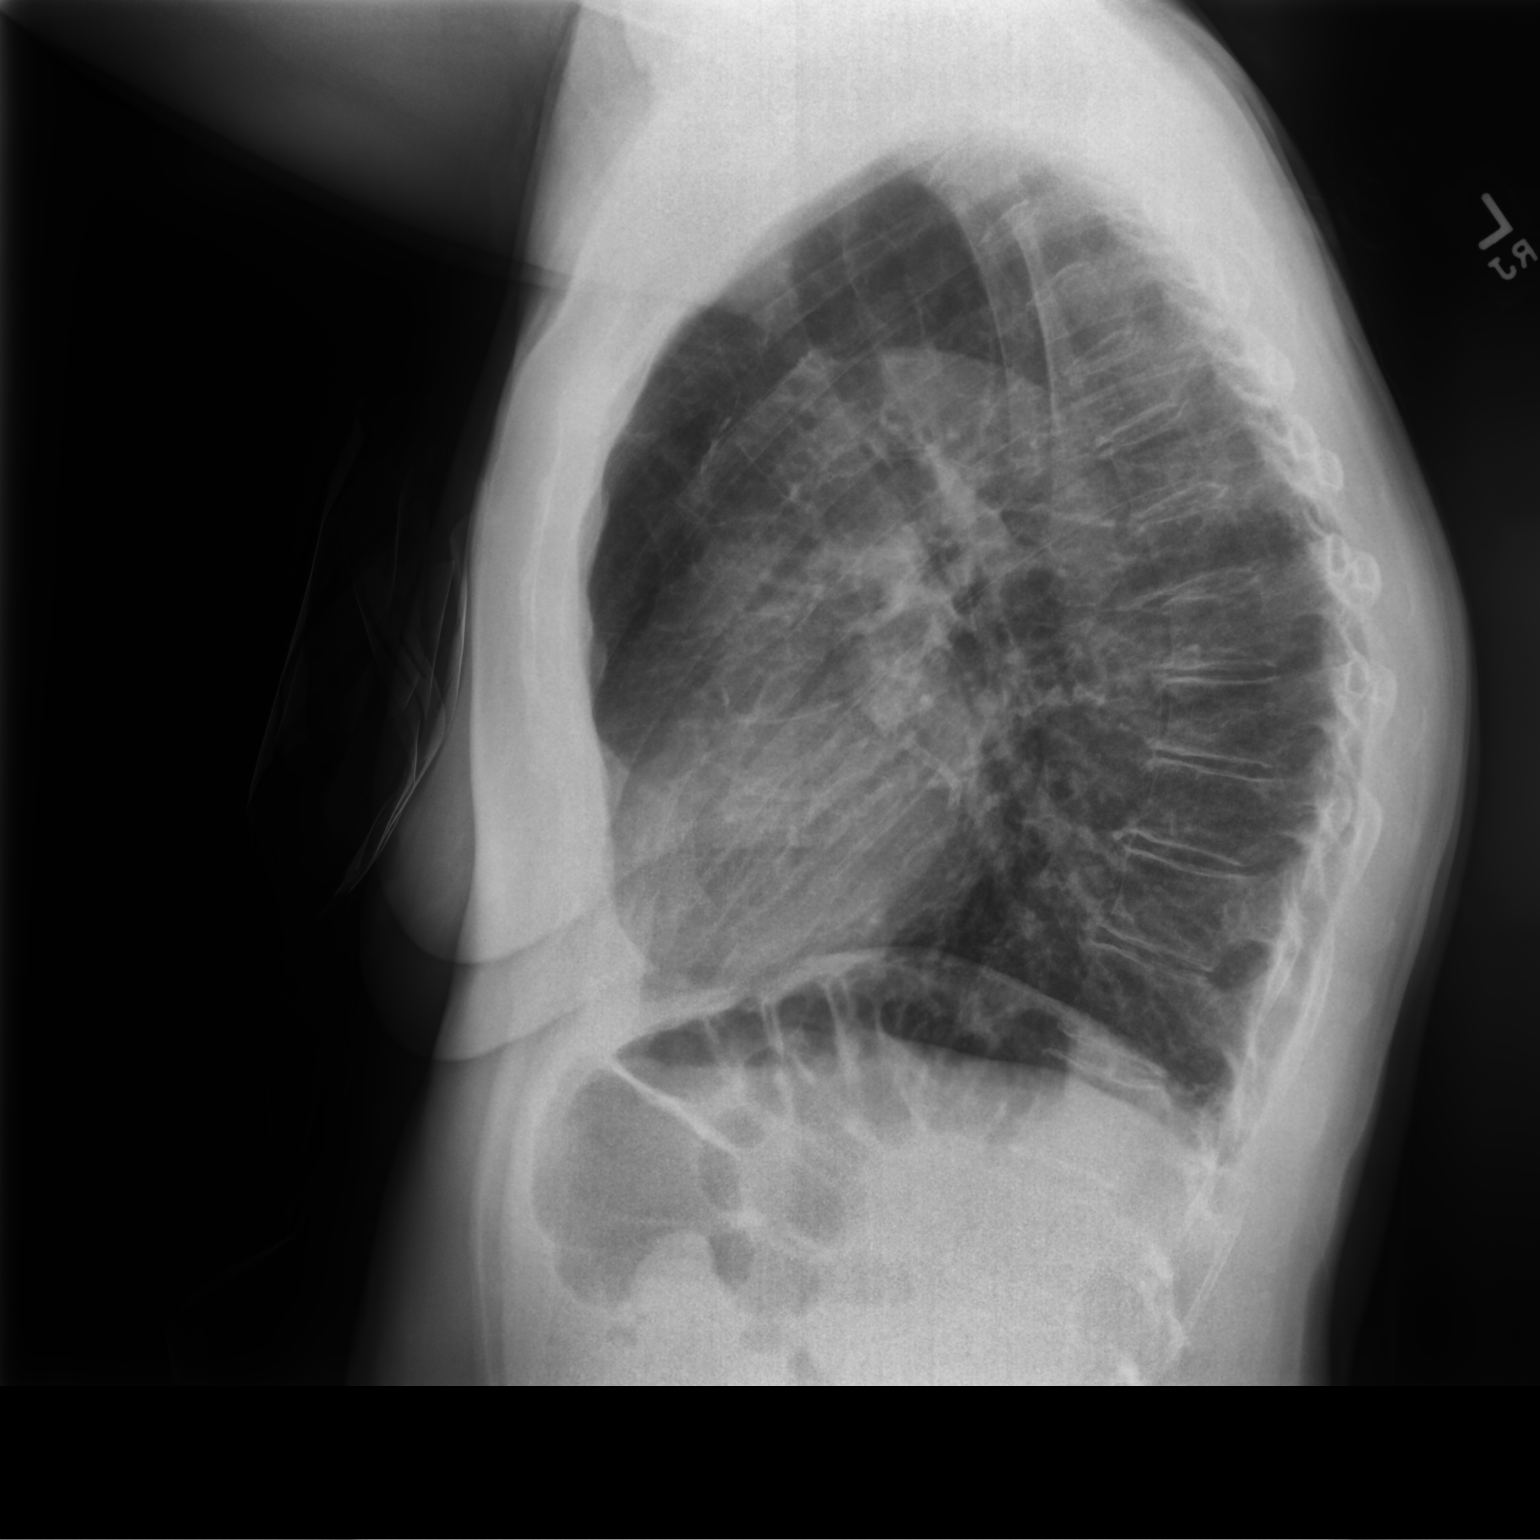

[2 of 2 positions shown; findings below may reference images not displayed]

FINDINGS: The heart size and mediastinal contours are within normal limits. No
pneumothorax or pleural effusion is noted. Emphysematous disease is
noted in the upper lobes. No acute pulmonary disease is noted.
Hyperexpansion of the lungs is noted suggesting COPD. The visualized
skeletal structures are unremarkable.
IMPRESSION: Hyperexpansion of the lungs suggesting COPD. No acute abnormality is
noted.

Aortic Atherosclerosis (C0QC2-4BL.L) and Emphysema (C0QC2-EY9.P).

## 2023-01-11 IMAGING — CT CT ANGIO CHEST
1 of 2 series · 19 of 32 positions shown · IV contrast (APPLIED)
Comparison: 11/30/2019

CLINICAL DATA: Chest pain, pleurisy

EXAM:
CT ANGIOGRAPHY CHEST WITH CONTRAST
TECHNIQUE: Multidetector CT imaging of the chest was performed using the
standard protocol during bolus administration of intravenous
contrast. Multiplanar CT image reconstructions and MIPs were
obtained to evaluate the vascular anatomy.
CONTRAST:  75mL RYXXYH-R56 IOPAMIDOL (RYXXYH-R56) INJECTION 76%

[Series 6: thins 1.0 b31s · axial · 0.71mm/px · z∈[-250,-6]mm · 19 of 272 slices shown]
[im 14/272  lung]
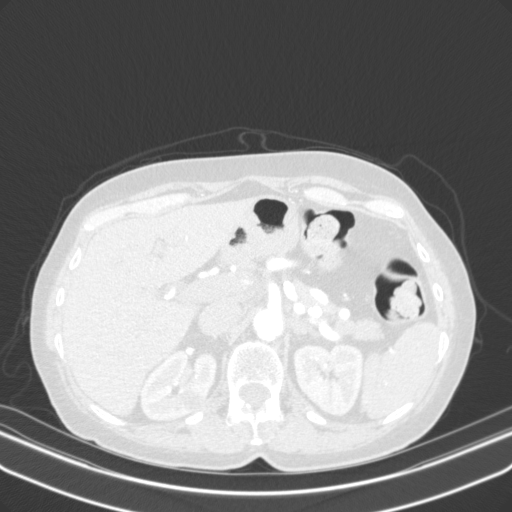
[im 28/272  mediastinal]
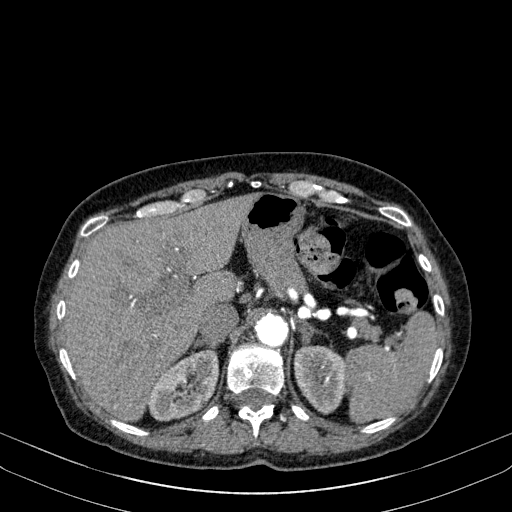
[im 41/272  lung]
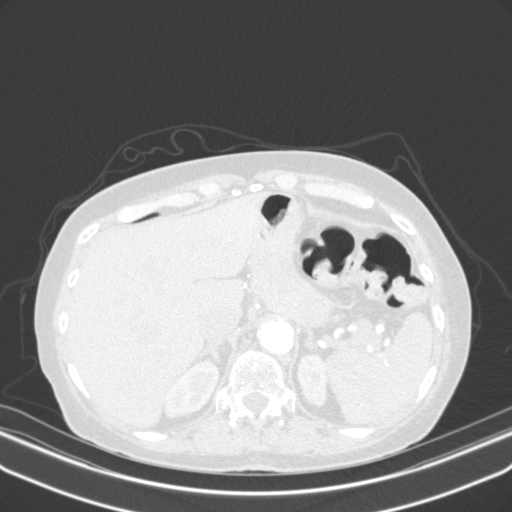
[im 68/272  mediastinal]
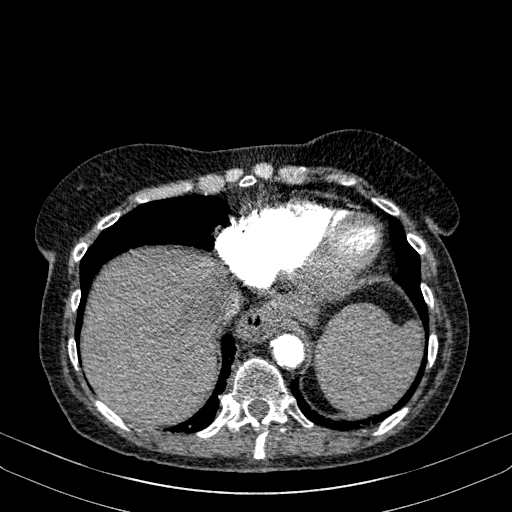
[im 82/272  lung]
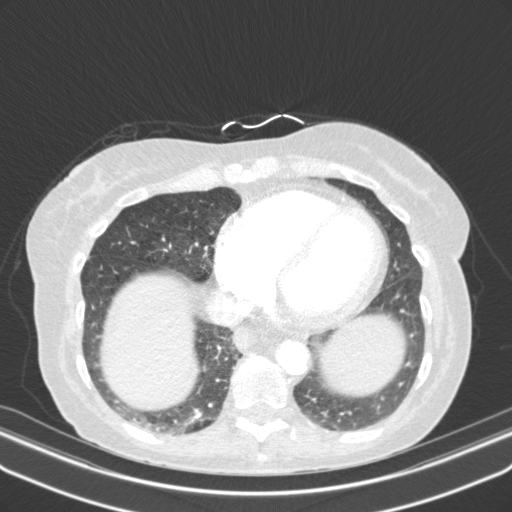
[im 91/272  mediastinal]
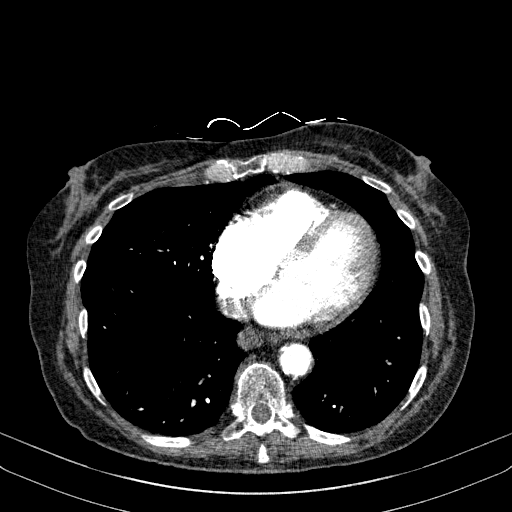
[im 95/272  lung]
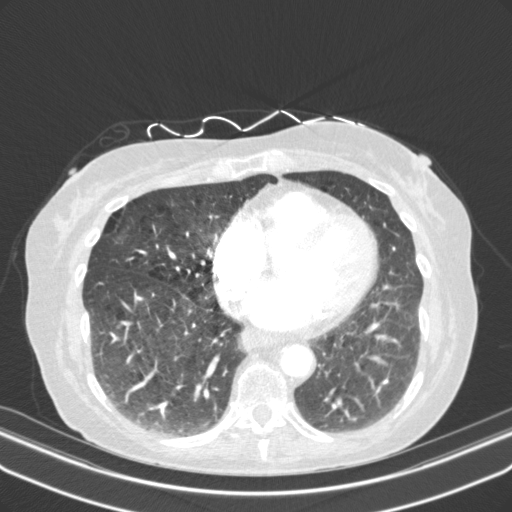
[im 109/272  mediastinal]
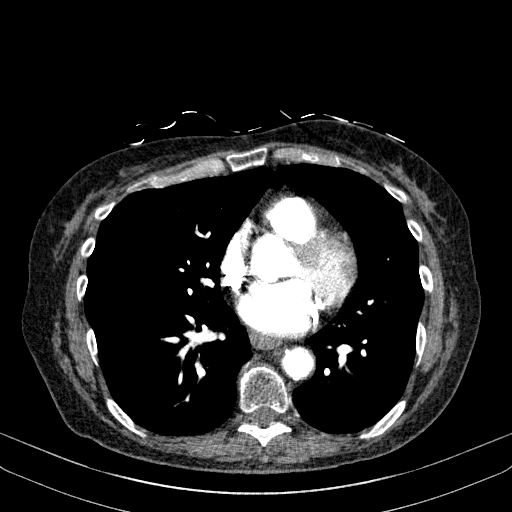
[im 122/272  lung]
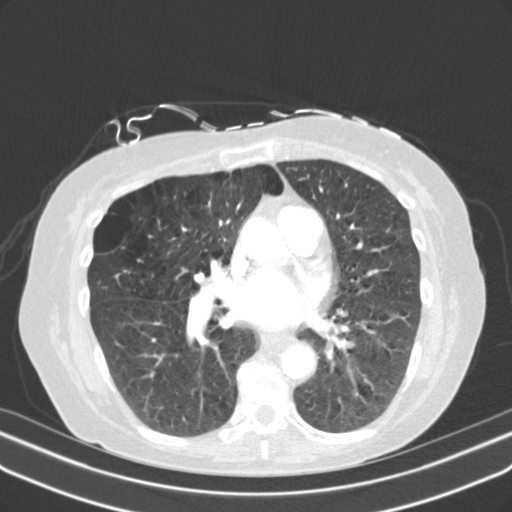
[im 136/272  mediastinal]
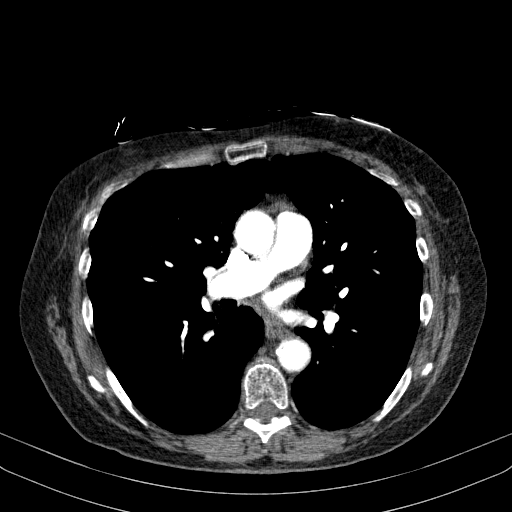
[im 150/272  lung]
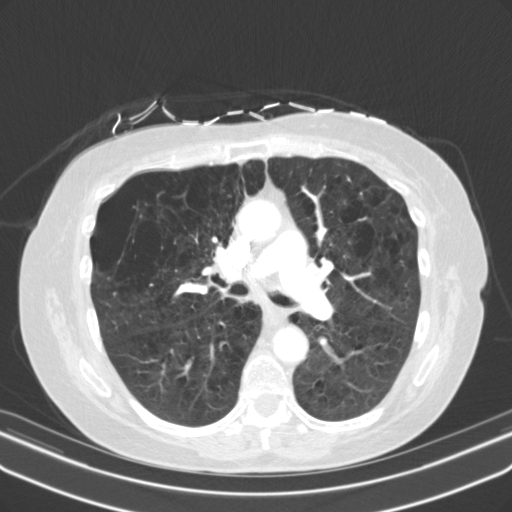
[im 163/272  mediastinal]
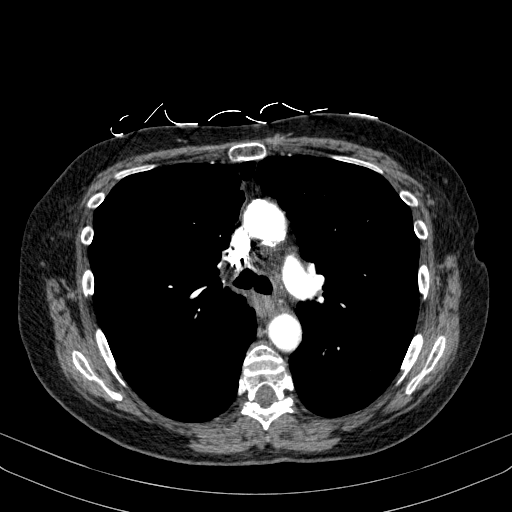
[im 177/272  lung]
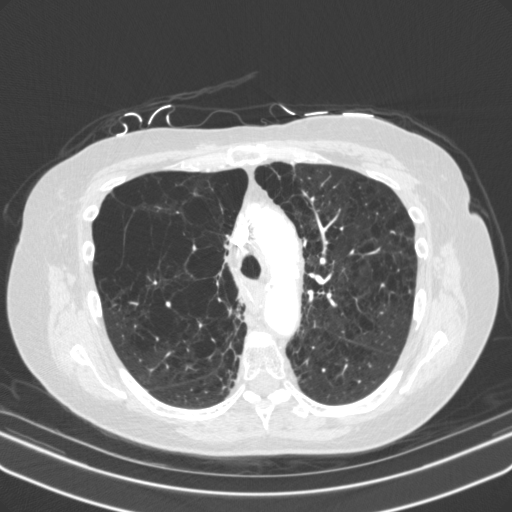
[im 181/272  mediastinal]
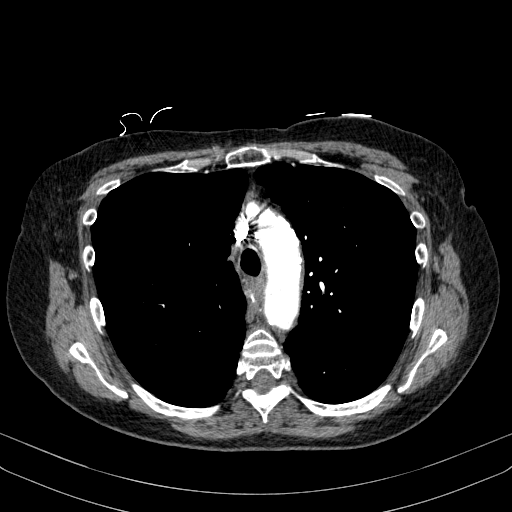
[im 190/272  lung]
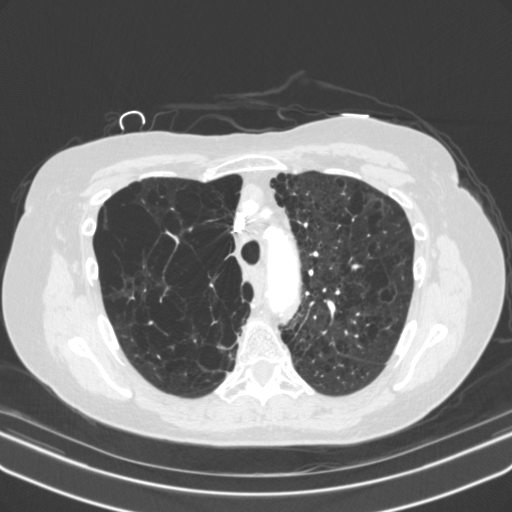
[im 204/272  mediastinal]
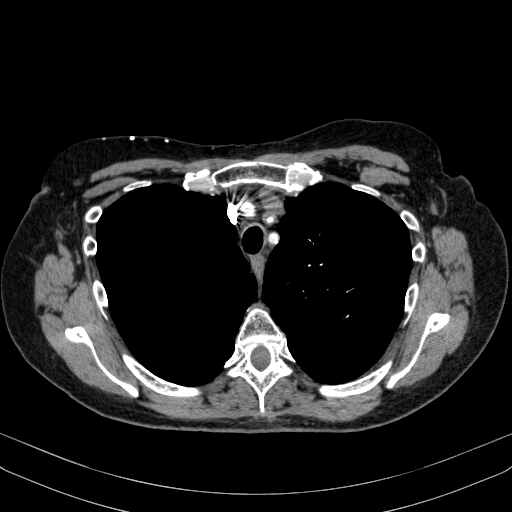
[im 231/272  lung]
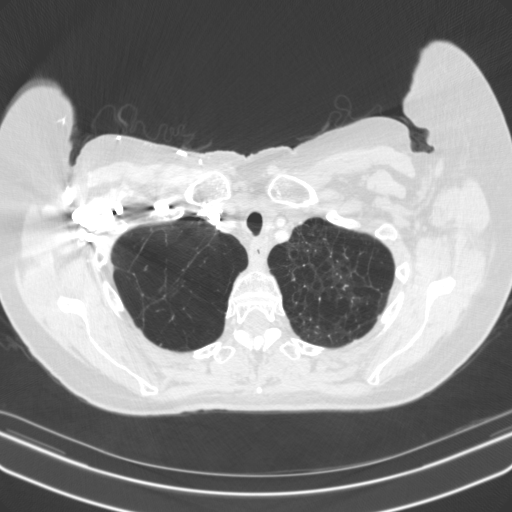
[im 244/272  mediastinal]
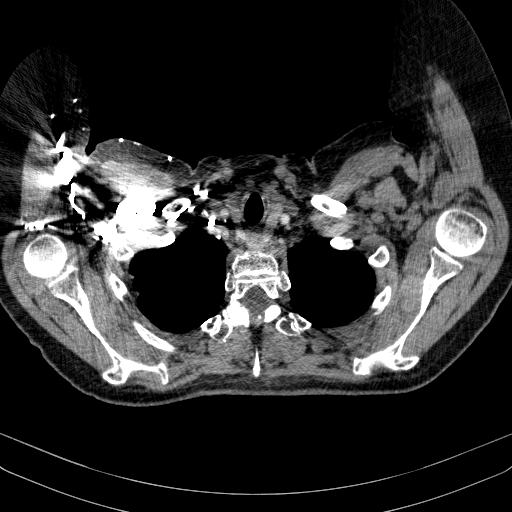
[im 258/272  lung]
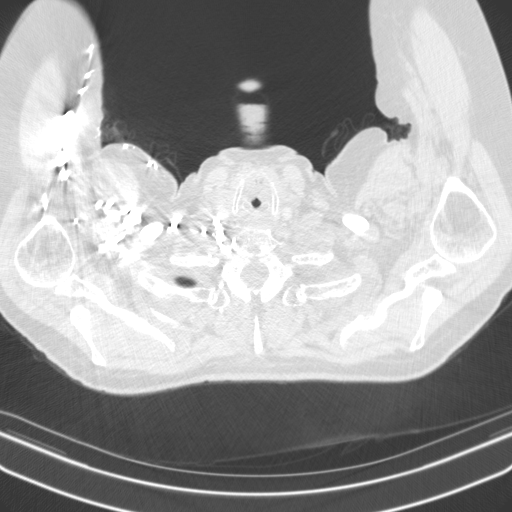

[19 of 32 positions shown; findings below may reference images not displayed]

FINDINGS: Cardiovascular: Satisfactory opacification of the pulmonary arteries
to the segmental level. No evidence of pulmonary embolism. Normal
heart size. Abdominal aortic atherosclerosis. No pericardial
effusion.

Mediastinum/Nodes: No enlarged mediastinal, hilar, or axillary lymph
nodes. Thyroid gland, trachea, and esophagus demonstrate no
significant findings.

Lungs/Pleura: No focal consolidation, pleural effusion or
pneumothorax. Extensive bilateral upper lobe centrilobular and
paraseptal emphysema.

Upper Abdomen: No acute abnormality.  Small hiatal hernia.

Musculoskeletal: No chest wall abnormality. No acute or significant
osseous findings.

Review of the MIP images confirms the above findings.
IMPRESSION: 1. No evidence of pulmonary embolus.
2. Aortic Atherosclerosis (KSM8B-USI.I) and Emphysema (KSM8B-T85.P).

## 2023-01-15 DIAGNOSIS — R1084 Generalized abdominal pain: Secondary | ICD-10-CM | POA: Diagnosis not present

## 2023-01-15 DIAGNOSIS — R109 Unspecified abdominal pain: Secondary | ICD-10-CM | POA: Diagnosis not present

## 2023-01-15 DIAGNOSIS — R110 Nausea: Secondary | ICD-10-CM | POA: Diagnosis not present

## 2023-01-15 DIAGNOSIS — K649 Unspecified hemorrhoids: Secondary | ICD-10-CM | POA: Diagnosis not present

## 2023-01-15 DIAGNOSIS — I700 Atherosclerosis of aorta: Secondary | ICD-10-CM | POA: Diagnosis not present

## 2023-01-15 DIAGNOSIS — K449 Diaphragmatic hernia without obstruction or gangrene: Secondary | ICD-10-CM | POA: Diagnosis not present

## 2023-01-15 DIAGNOSIS — N281 Cyst of kidney, acquired: Secondary | ICD-10-CM | POA: Diagnosis not present

## 2023-01-15 DIAGNOSIS — I10 Essential (primary) hypertension: Secondary | ICD-10-CM | POA: Diagnosis not present

## 2023-01-15 DIAGNOSIS — Z96642 Presence of left artificial hip joint: Secondary | ICD-10-CM | POA: Diagnosis not present

## 2023-01-15 DIAGNOSIS — K648 Other hemorrhoids: Secondary | ICD-10-CM | POA: Diagnosis not present

## 2023-01-16 DIAGNOSIS — K625 Hemorrhage of anus and rectum: Secondary | ICD-10-CM | POA: Diagnosis not present

## 2023-01-20 IMAGING — CT CT CHEST W/ CM
2 of 4 series · 15 of 36 positions shown, 18 images · IV contrast (OMNIPAQUE)
Comparison: CT scan 09/13/2020 and prior PET-CT 09/21/2019

CLINICAL DATA: Restaging small cell lung cancer. Initial diagnosis
in 2086. Chemotherapy and radiation therapy concluded.

EXAM:
CT CHEST WITH CONTRAST
TECHNIQUE: Multidetector CT imaging of the chest was performed during
intravenous contrast administration.
CONTRAST:  75mL OMNIPAQUE IOHEXOL 300 MG/ML  SOLN

[Series 3: axial st · axial · 0.63mm/px · z∈[-162,+80]mm · 12 of 145 slices shown, 15 images]
[im 12/145  mediastinal]
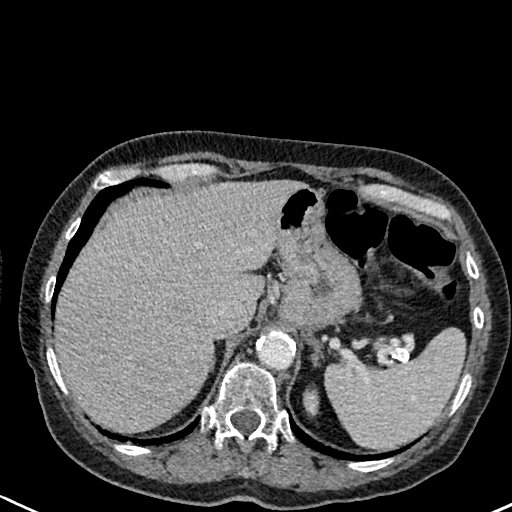
[im 12/145  lung]
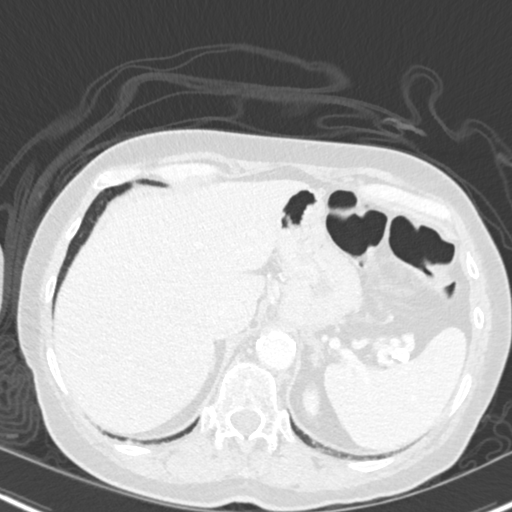
[im 23/145  lung]
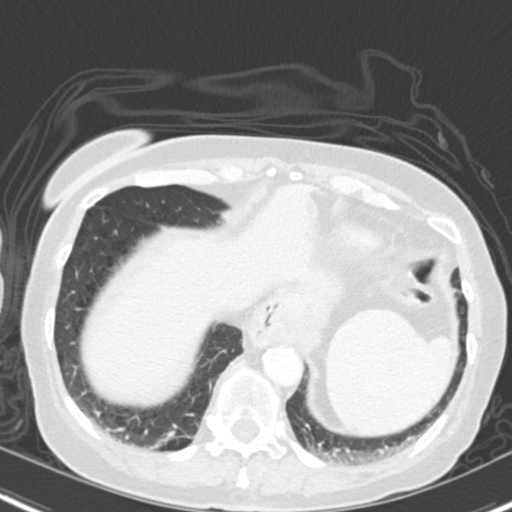
[im 34/145  lung]
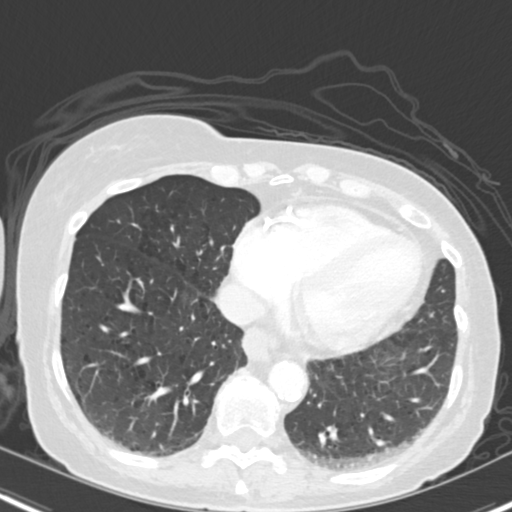
[im 45/145  lung]
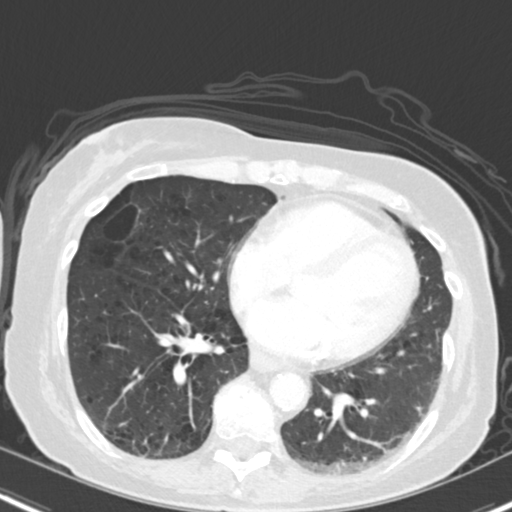
[im 56/145  mediastinal]
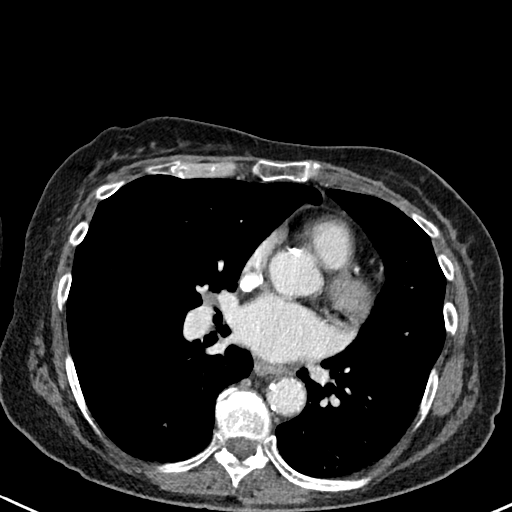
[im 56/145  lung]
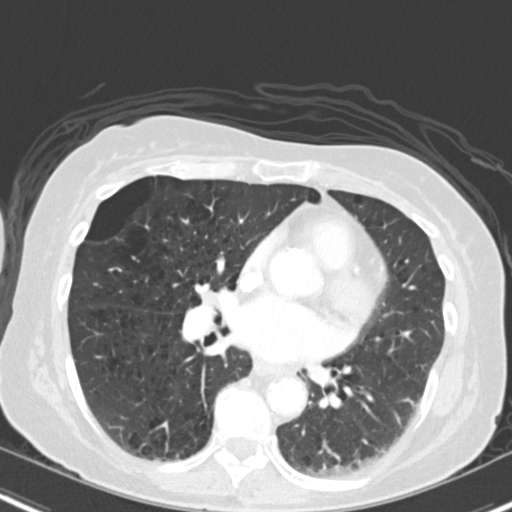
[im 67/145  lung]
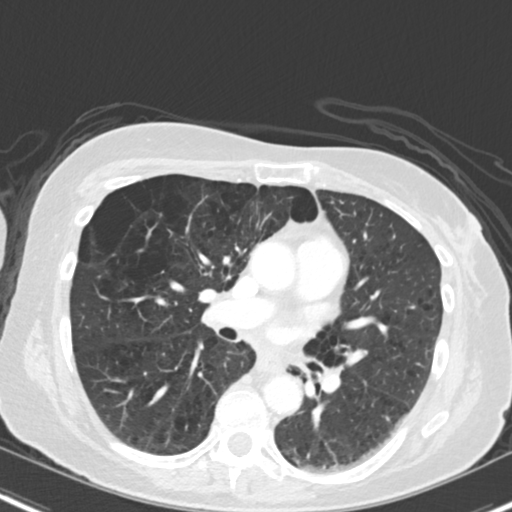
[im 78/145  lung]
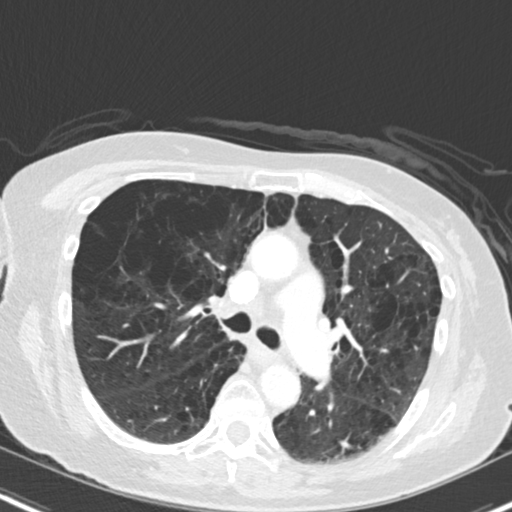
[im 89/145  lung]
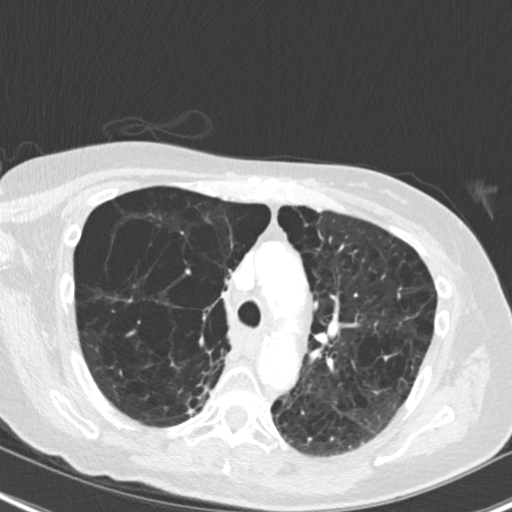
[im 100/145  mediastinal]
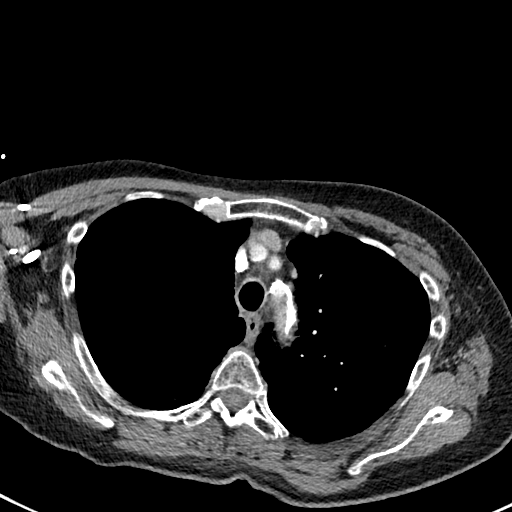
[im 100/145  lung]
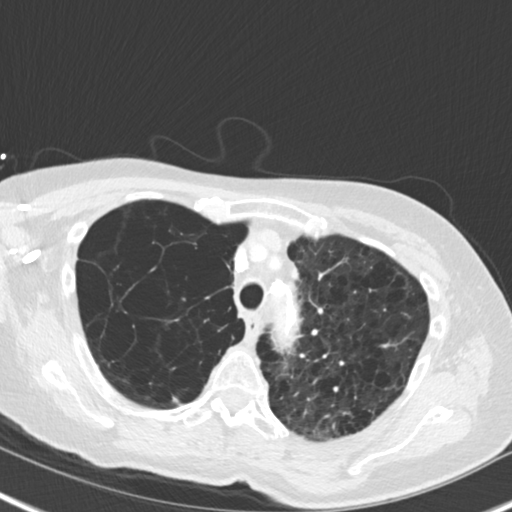
[im 111/145  lung]
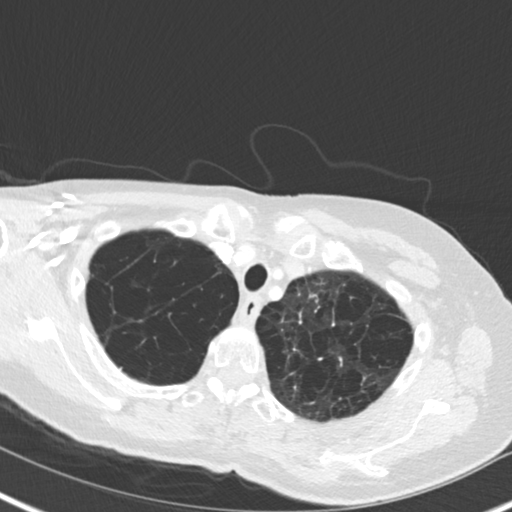
[im 122/145  lung]
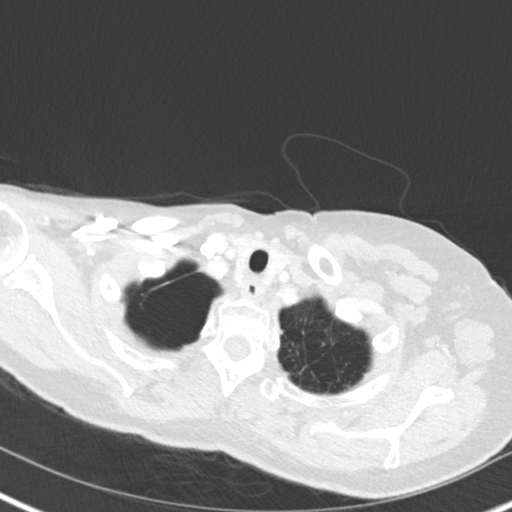
[im 133/145  lung]
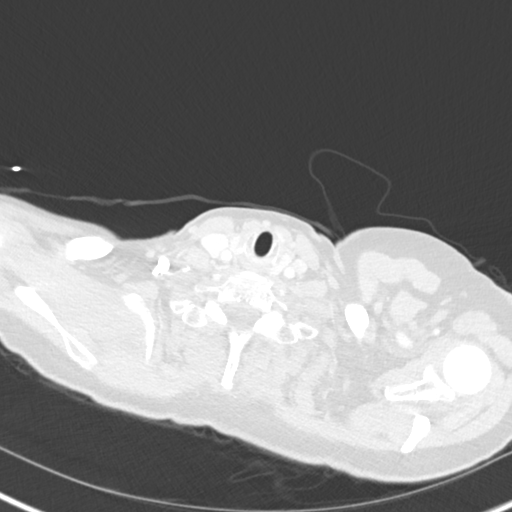

[Series 5: coronal · coronal · 0.57mm/px · 3 of 126 slices shown]
[im 26/126  lung]
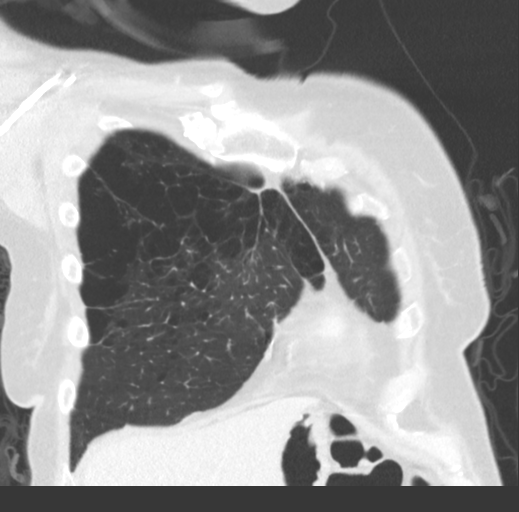
[im 51/126  lung]
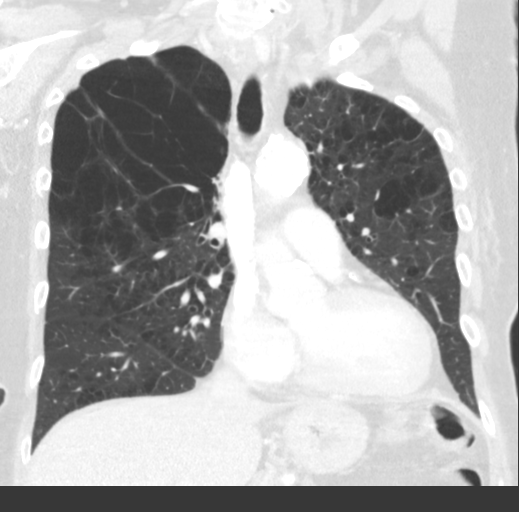
[im 76/126  lung]
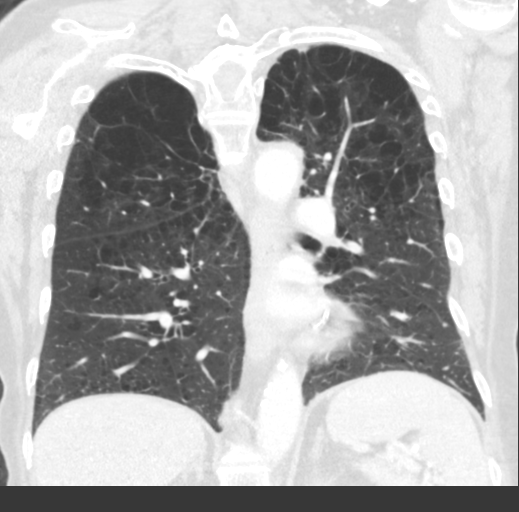

[15 of 36 positions shown; findings below may reference images not displayed]

FINDINGS: Cardiovascular: The heart is normal in size. No pericardial
effusion. Stable age advanced atherosclerotic calcifications
involving the aorta and branch vessels including three-vessel
coronary artery calcifications.

Mediastinum/Nodes: No mediastinal or hilar mass or lymphadenopathy.
Small scattered lymph nodes are stable. The esophagus is grossly
normal. Stable moderate-sized hiatal hernia.

Lungs/Pleura: Stable residual soft tissue density in the right
suprahilar/paramediastinal lung near the level of the carina. No
findings suspicious for recurrent tumor.

Stable background of severe emphysema with advanced pulmonary
scarring. No new pulmonary lesions or pulmonary nodules. No acute
pulmonary findings. No pleural effusions or pleural nodules.

Upper Abdomen: No findings for upper abdominal metastatic disease.
There is a stable small low-attenuation nodule involving the left
adrenal gland which is likely a benign adenoma. Stable aortic
calcifications.

Musculoskeletal: No breast masses, supraclavicular or axillary
adenopathy. The thyroid gland is unremarkable. No significant bony
findings. No findings worrisome for metastatic disease.
IMPRESSION: 1. Stable residual soft tissue density in the right
suprahilar/paramediastinal lung near the level of the carina. No
findings suspicious for recurrent tumor.
2. No mediastinal or hilar mass or adenopathy.
3. Stable background of severe emphysema with advanced pulmonary
scarring.
4. Stable age advanced atherosclerotic calcifications involving the
aorta and branch vessels including three-vessel coronary artery
calcifications.
5. Stable moderate-sized hiatal hernia.
6. Stable left adrenal gland adenoma.
7. Emphysema and aortic atherosclerosis.

Aortic Atherosclerosis (J8FNM-TVQ.Q) and Emphysema ([id number].W).

## 2023-01-24 DIAGNOSIS — R32 Unspecified urinary incontinence: Secondary | ICD-10-CM | POA: Diagnosis not present

## 2023-01-28 DIAGNOSIS — K648 Other hemorrhoids: Secondary | ICD-10-CM | POA: Diagnosis not present

## 2023-01-28 DIAGNOSIS — F17210 Nicotine dependence, cigarettes, uncomplicated: Secondary | ICD-10-CM | POA: Diagnosis not present

## 2023-01-28 DIAGNOSIS — K222 Esophageal obstruction: Secondary | ICD-10-CM | POA: Diagnosis not present

## 2023-01-28 DIAGNOSIS — K449 Diaphragmatic hernia without obstruction or gangrene: Secondary | ICD-10-CM | POA: Diagnosis not present

## 2023-01-28 DIAGNOSIS — R110 Nausea: Secondary | ICD-10-CM | POA: Diagnosis not present

## 2023-01-28 DIAGNOSIS — Z86010 Personal history of colonic polyps: Secondary | ICD-10-CM | POA: Diagnosis not present

## 2023-01-28 DIAGNOSIS — K5900 Constipation, unspecified: Secondary | ICD-10-CM | POA: Diagnosis not present

## 2023-01-28 DIAGNOSIS — R194 Change in bowel habit: Secondary | ICD-10-CM | POA: Diagnosis not present

## 2023-01-28 DIAGNOSIS — K2100 Gastro-esophageal reflux disease with esophagitis, without bleeding: Secondary | ICD-10-CM | POA: Diagnosis not present

## 2023-01-28 DIAGNOSIS — R1310 Dysphagia, unspecified: Secondary | ICD-10-CM | POA: Diagnosis not present

## 2023-01-28 DIAGNOSIS — R1031 Right lower quadrant pain: Secondary | ICD-10-CM | POA: Diagnosis not present

## 2023-01-30 DIAGNOSIS — R5383 Other fatigue: Secondary | ICD-10-CM | POA: Diagnosis not present

## 2023-01-30 DIAGNOSIS — Z20822 Contact with and (suspected) exposure to covid-19: Secondary | ICD-10-CM | POA: Diagnosis not present

## 2023-01-30 DIAGNOSIS — I2510 Atherosclerotic heart disease of native coronary artery without angina pectoris: Secondary | ICD-10-CM | POA: Diagnosis not present

## 2023-01-30 DIAGNOSIS — R531 Weakness: Secondary | ICD-10-CM | POA: Diagnosis not present

## 2023-01-30 DIAGNOSIS — R1084 Generalized abdominal pain: Secondary | ICD-10-CM | POA: Diagnosis not present

## 2023-01-30 DIAGNOSIS — J439 Emphysema, unspecified: Secondary | ICD-10-CM | POA: Diagnosis not present

## 2023-01-30 DIAGNOSIS — I959 Hypotension, unspecified: Secondary | ICD-10-CM | POA: Diagnosis not present

## 2023-01-30 DIAGNOSIS — I700 Atherosclerosis of aorta: Secondary | ICD-10-CM | POA: Diagnosis not present

## 2023-01-30 DIAGNOSIS — R059 Cough, unspecified: Secondary | ICD-10-CM | POA: Diagnosis not present

## 2023-01-30 DIAGNOSIS — R300 Dysuria: Secondary | ICD-10-CM | POA: Diagnosis not present

## 2023-01-30 DIAGNOSIS — R0602 Shortness of breath: Secondary | ICD-10-CM | POA: Diagnosis not present

## 2023-01-30 DIAGNOSIS — R42 Dizziness and giddiness: Secondary | ICD-10-CM | POA: Diagnosis not present

## 2023-01-30 DIAGNOSIS — R0789 Other chest pain: Secondary | ICD-10-CM | POA: Diagnosis not present

## 2023-01-30 DIAGNOSIS — R109 Unspecified abdominal pain: Secondary | ICD-10-CM | POA: Diagnosis not present

## 2023-01-30 DIAGNOSIS — R079 Chest pain, unspecified: Secondary | ICD-10-CM | POA: Diagnosis not present

## 2023-01-30 DIAGNOSIS — K449 Diaphragmatic hernia without obstruction or gangrene: Secondary | ICD-10-CM | POA: Diagnosis not present

## 2023-01-31 DIAGNOSIS — R0602 Shortness of breath: Secondary | ICD-10-CM | POA: Diagnosis not present

## 2023-02-10 DIAGNOSIS — R32 Unspecified urinary incontinence: Secondary | ICD-10-CM | POA: Diagnosis not present

## 2023-02-17 ENCOUNTER — Telehealth: Payer: Self-pay | Admitting: Cardiology

## 2023-02-17 MED ORDER — REPATHA SURECLICK 140 MG/ML ~~LOC~~ SOAJ: 140.0000 mg | SUBCUTANEOUS | 3 refills | Status: DC

## 2023-02-17 NOTE — Telephone Encounter (Signed)
Refill sent in

## 2023-02-17 NOTE — Telephone Encounter (Signed)
*  STAT* If patient is at the pharmacy, call can be transferred to refill team.   1. Which medications need to be refilled? (please list name of each medication and dose if known)   Evolocumab (REPATHA SURECLICK) 140 MG/ML SOAJ    2. Which pharmacy/location (including street and city if local pharmacy) is medication to be sent to?  [id number] - [place], [place] - [address] AT [facility] OF [address]    3. Do they need a 30 day or 90 day supply? 90

## 2023-02-20 DIAGNOSIS — R590 Localized enlarged lymph nodes: Secondary | ICD-10-CM | POA: Diagnosis not present

## 2023-02-20 DIAGNOSIS — R399 Unspecified symptoms and signs involving the genitourinary system: Secondary | ICD-10-CM | POA: Diagnosis not present

## 2023-02-21 ENCOUNTER — Other Ambulatory Visit: Payer: Self-pay | Admitting: Physician Assistant

## 2023-02-21 ENCOUNTER — Telehealth: Payer: Self-pay | Admitting: Cardiology

## 2023-02-21 DIAGNOSIS — R590 Localized enlarged lymph nodes: Secondary | ICD-10-CM

## 2023-02-21 NOTE — Telephone Encounter (Signed)
Reference number: [id number]   Office calling to ask for permission to replace prescription bc it was broken. Please advise    Returned call: spoke with pharmacist Debbie. Given confirmation to replace prescription.

## 2023-02-21 NOTE — Telephone Encounter (Signed)
Pt c/o medication issue:  1. Name of Medication:   Evolocumab (REPATHA SURECLICK) 140 MG/ML SOAJ    2. How are you currently taking this medication (dosage and times per day)? Inject 140 mg into the skin every 14 (fourteen) days.   3. Are you having a reaction (difficulty breathing--STAT)? No  4. What is your medication issue? Office calling to ask for permission to replace prescription bc it was broken. Please advise    Reference number: [id number]

## 2023-02-24 DIAGNOSIS — R32 Unspecified urinary incontinence: Secondary | ICD-10-CM | POA: Diagnosis not present

## 2023-02-28 ENCOUNTER — Other Ambulatory Visit: Payer: Medicare HMO

## 2023-03-05 ENCOUNTER — Inpatient Hospital Stay
Admission: RE | Admit: 2023-03-05 | Discharge: 2023-03-05 | Discharge status: Home / Self Care | Payer: Medicare HMO | Source: Ambulatory Visit | Attending: Physician Assistant | Admitting: Physician Assistant

## 2023-03-05 DIAGNOSIS — Z85118 Personal history of other malignant neoplasm of bronchus and lung: Secondary | ICD-10-CM | POA: Diagnosis not present

## 2023-03-05 DIAGNOSIS — R590 Localized enlarged lymph nodes: Secondary | ICD-10-CM

## 2023-03-05 DIAGNOSIS — R599 Enlarged lymph nodes, unspecified: Secondary | ICD-10-CM | POA: Diagnosis not present

## 2023-03-10 DIAGNOSIS — S72002A Fracture of unspecified part of neck of left femur, initial encounter for closed fracture: Secondary | ICD-10-CM | POA: Diagnosis not present

## 2023-03-11 DIAGNOSIS — Z96642 Presence of left artificial hip joint: Secondary | ICD-10-CM | POA: Diagnosis not present

## 2023-03-11 DIAGNOSIS — M25552 Pain in left hip: Secondary | ICD-10-CM | POA: Diagnosis not present

## 2023-03-14 DIAGNOSIS — R32 Unspecified urinary incontinence: Secondary | ICD-10-CM | POA: Diagnosis not present

## 2023-03-20 DIAGNOSIS — F419 Anxiety disorder, unspecified: Secondary | ICD-10-CM | POA: Diagnosis not present

## 2023-03-20 DIAGNOSIS — Z0000 Encounter for general adult medical examination without abnormal findings: Secondary | ICD-10-CM | POA: Diagnosis not present

## 2023-03-20 DIAGNOSIS — K219 Gastro-esophageal reflux disease without esophagitis: Secondary | ICD-10-CM | POA: Diagnosis not present

## 2023-03-20 DIAGNOSIS — N3946 Mixed incontinence: Secondary | ICD-10-CM | POA: Diagnosis not present

## 2023-03-20 DIAGNOSIS — Z9181 History of falling: Secondary | ICD-10-CM | POA: Diagnosis not present

## 2023-03-20 DIAGNOSIS — R7303 Prediabetes: Secondary | ICD-10-CM | POA: Diagnosis not present

## 2023-03-20 DIAGNOSIS — Z23 Encounter for immunization: Secondary | ICD-10-CM | POA: Diagnosis not present

## 2023-03-20 DIAGNOSIS — C3490 Malignant neoplasm of unspecified part of unspecified bronchus or lung: Secondary | ICD-10-CM | POA: Diagnosis not present

## 2023-03-20 DIAGNOSIS — E7800 Pure hypercholesterolemia, unspecified: Secondary | ICD-10-CM | POA: Diagnosis not present

## 2023-03-20 DIAGNOSIS — R0789 Other chest pain: Secondary | ICD-10-CM | POA: Diagnosis not present

## 2023-03-20 DIAGNOSIS — J449 Chronic obstructive pulmonary disease, unspecified: Secondary | ICD-10-CM | POA: Diagnosis not present

## 2023-03-25 DIAGNOSIS — N950 Postmenopausal bleeding: Secondary | ICD-10-CM | POA: Diagnosis not present

## 2023-03-25 DIAGNOSIS — K649 Unspecified hemorrhoids: Secondary | ICD-10-CM | POA: Diagnosis not present

## 2023-03-25 DIAGNOSIS — R32 Unspecified urinary incontinence: Secondary | ICD-10-CM | POA: Diagnosis not present

## 2023-03-26 ENCOUNTER — Ambulatory Visit (HOSPITAL_COMMUNITY): Admit: 2023-03-26 | Payer: Medicare HMO | Admitting: Obstetrics and Gynecology

## 2023-03-26 SURGERY — DILATATION & CURETTAGE/HYSTEROSCOPY WITH MYOSURE
Anesthesia: Choice

## 2023-03-29 DIAGNOSIS — R32 Unspecified urinary incontinence: Secondary | ICD-10-CM | POA: Diagnosis not present

## 2023-03-31 ENCOUNTER — Other Ambulatory Visit: Payer: Self-pay | Admitting: Cardiology

## 2023-04-10 ENCOUNTER — Telehealth: Payer: Self-pay | Admitting: Cardiology

## 2023-04-10 NOTE — Telephone Encounter (Signed)
Patient called with concerns about the Imdur keeping her awake. She is unsure if it is the medication or if she can't sleep.She also asked what Furosamide was for and I explained what the medication was used for. Medication not seen on her medication list but she has been taking it. Please advise.

## 2023-04-10 NOTE — Telephone Encounter (Signed)
Pt c/o medication issue:  1. Name of Medication:  isosorbide mononitrate (IMDUR) 30 MG 24 hr tablet   2. How are you currently taking this medication (dosage and times per day)? As prescribed  3. Are you having a reaction (difficulty breathing--STAT)? No   4. What is your medication issue? Wants to know if this should be keeping her up at night. She reports she was up until 4 am after taking this medication last night. Please advise.

## 2023-04-11 NOTE — Telephone Encounter (Signed)
Message relayed to patient. Verbalized understanding agree.      Imdur is not keeping her awake.   Furosemide was originally meant to be a as needed medicine for her. I do not know what dose she was on because I have not seen her in a long time.

## 2023-04-11 NOTE — Telephone Encounter (Signed)
Imdur is not keeping her awake.  Furosemide was originally meant to be a as needed medicine for her. I do not know what dose she was on because I have not seen her in a long time.  [name], MD

## 2023-04-11 NOTE — Telephone Encounter (Signed)
Patient identification verified by 2 forms. Michele Rail, RN   Called and spoke to patient  Patient states:   -she was not sure which medication she was told to discontinue   -she can't sleep at night   -she wakes up at night to urinate frequently -is concerned about her acid medication and the Pepcid  RN informed patient:  -Pepcid not on active medication list, should be taking Pantoprazole 1 tablet daily  -per Dr. [name] furosemide was originally PRN only, Imdur not likely to keep her up  Per chart review last visit 11/20/22 with NP Laural Benes  Patient scheduled for follow up with NP Laural Benes 11/12 at 8:25am (no appointments await in December with Dr. [name] or NP Laural Benes)  Patient agrees with appointment follow up, advised her to bring in medications  Patient verbalized understanding, no questions at this time

## 2023-04-11 NOTE — Telephone Encounter (Signed)
Patient is calling again stating that she does not remember what it was that the nurse relayed to her in regards to the medicine. Requesting call back.

## 2023-04-15 ENCOUNTER — Ambulatory Visit: Payer: Medicare HMO | Admitting: Cardiology

## 2023-04-19 IMAGING — DX DG CHEST 1V PORT
1 series · 1 of 1 positions shown · non-contrast
Comparison: Radiograph 09/12/2020, CT 09/22/2020

CLINICAL DATA: COVID positive.

EXAM:
PORTABLE CHEST 1 VIEW

[chest]
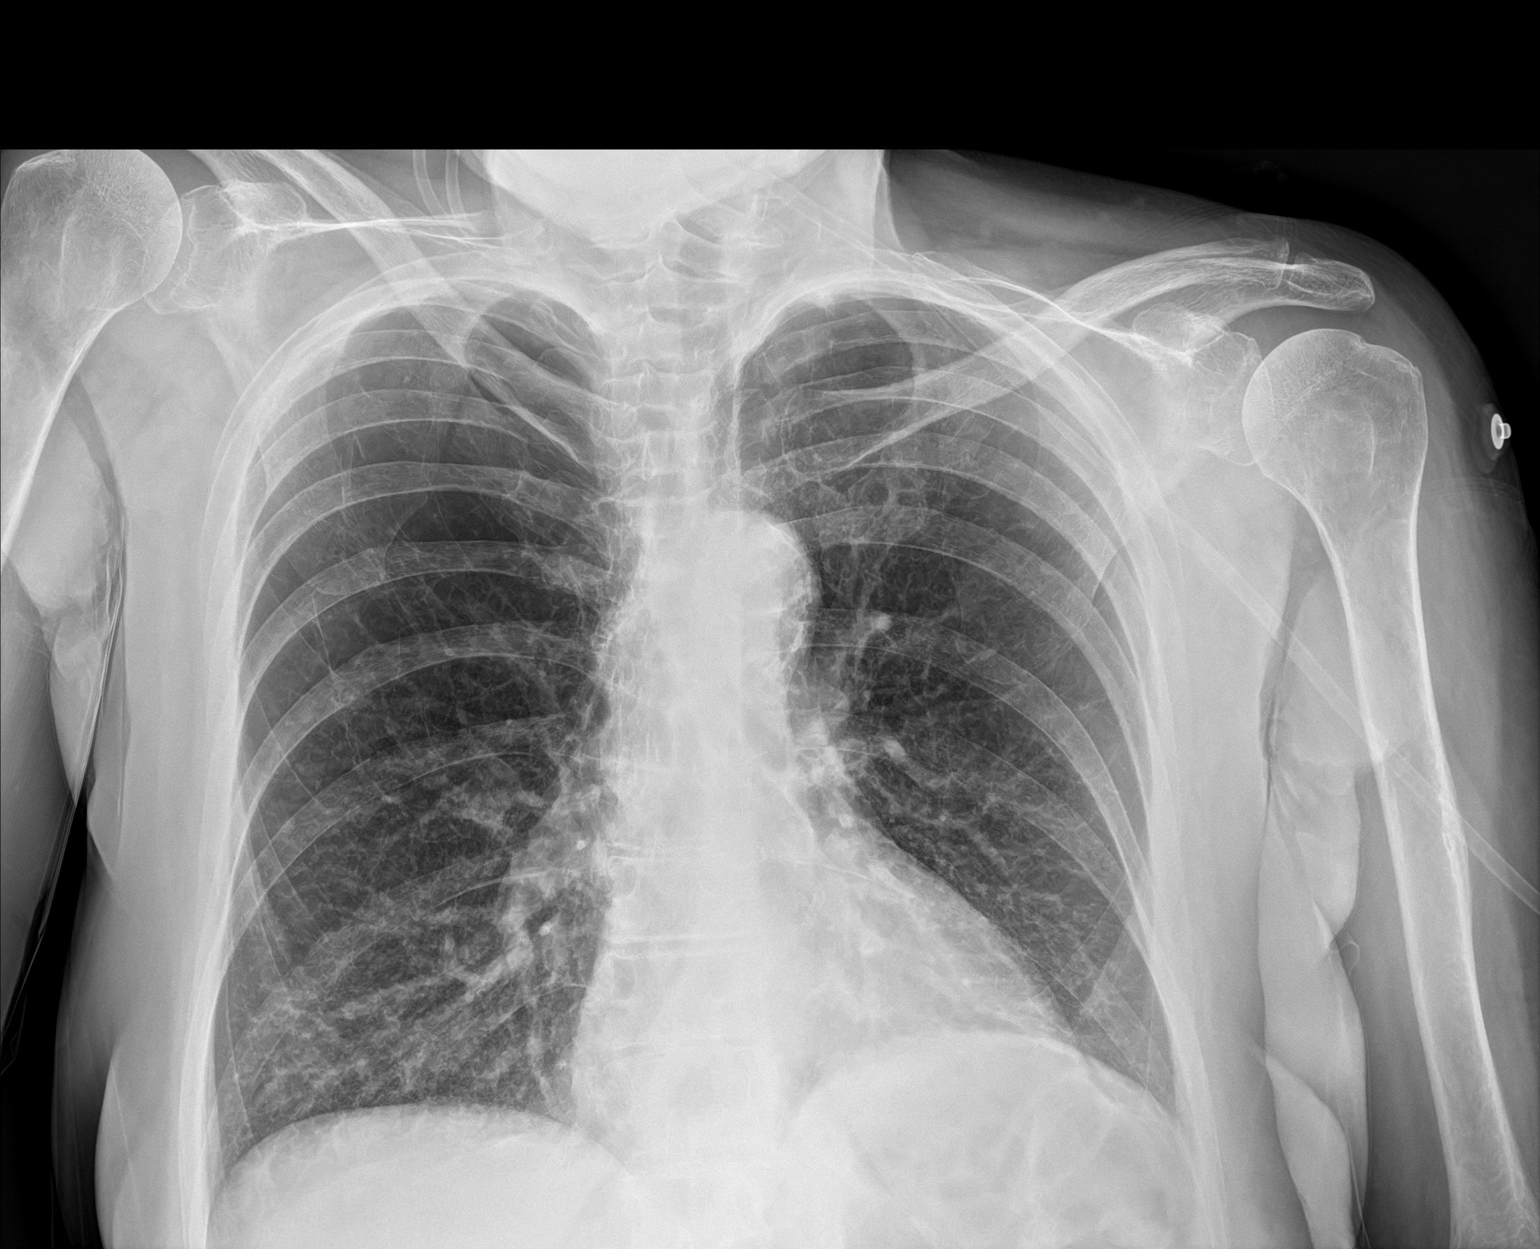

[1 of 1 positions shown; findings below may reference images not displayed]

FINDINGS: Stable radiographic appearance of the chest. No acute airspace
disease. Emphysema, advanced in the right upper lobe. Stable heart
size and mediastinal contours. Aortic atherosclerosis and coronary
artery calcifications are stent is seen. No pneumothorax, pulmonary
edema, or pleural effusion. No acute osseous abnormalities are seen.
IMPRESSION: Emphysema without superimposed acute process.

## 2023-04-19 IMAGING — CT CT ABD-PELV W/ CM
2 of 5 series · 16 of 46 positions shown, 18 images · IV contrast (APPLIED)
Comparison: Abdomen pelvis 01/07/2020

CLINICAL DATA: Diverticulitis suspected. bilateral thigh pain x4
days. States pain has been so severe that she is unable to sleep at
night. Pt reports she is having difficulty walking.

EXAM:
CT ABDOMEN AND PELVIS WITH CONTRAST
TECHNIQUE: Multidetector CT imaging of the abdomen and pelvis was performed
using the standard protocol following bolus administration of
intravenous contrast.
CONTRAST:  75mL OMNIPAQUE IOHEXOL 350 MG/ML SOLN

[Series 2: abd pel w · axial · 0.76mm/px · z∈[+980,+1320]mm · 13 of 78 slices shown, 15 images]
[im 5/78  soft-tissue]
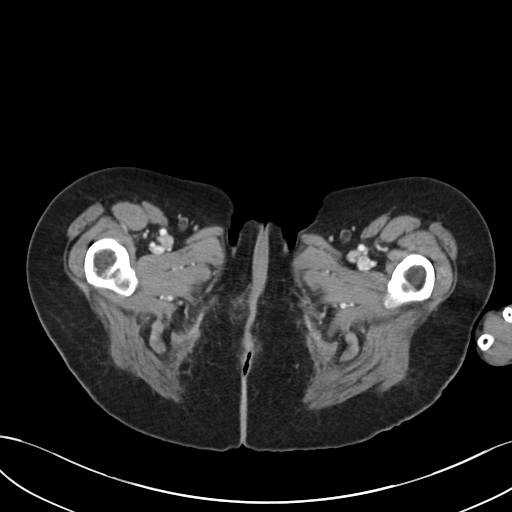
[im 5/78  bone]
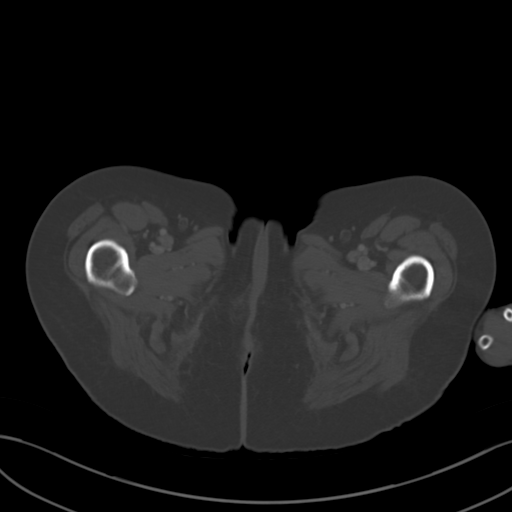
[im 13/78  soft-tissue]
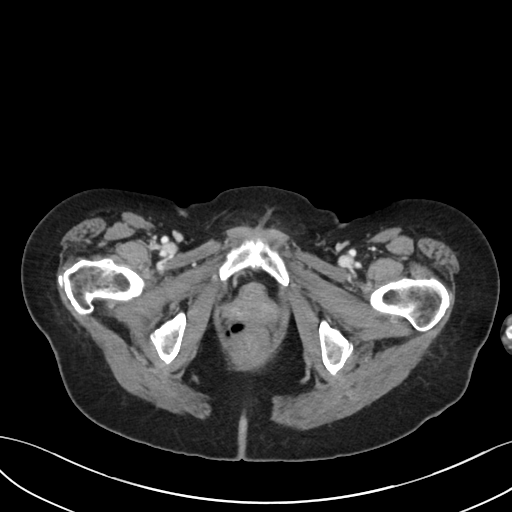
[im 17/78  soft-tissue]
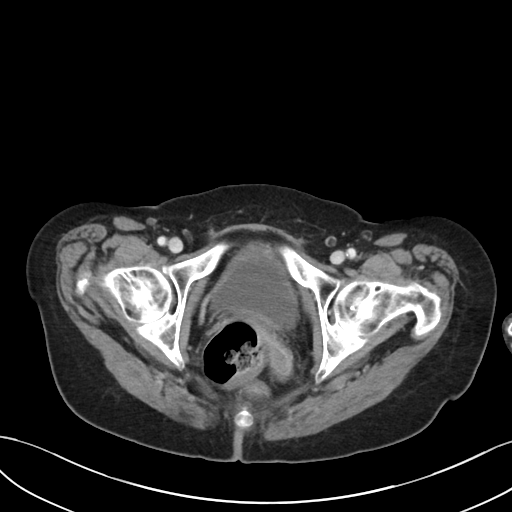
[im 21/78  soft-tissue]
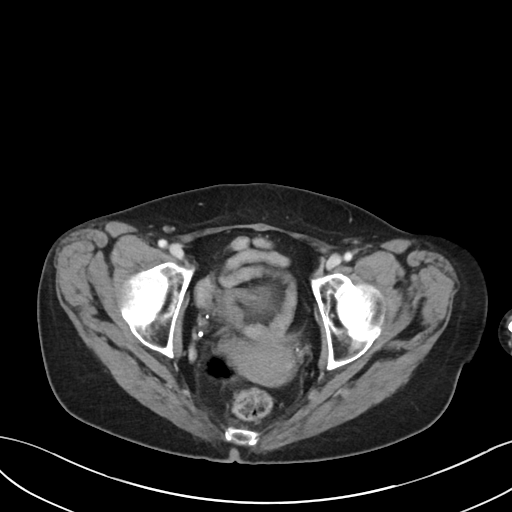
[im 29/78  soft-tissue]
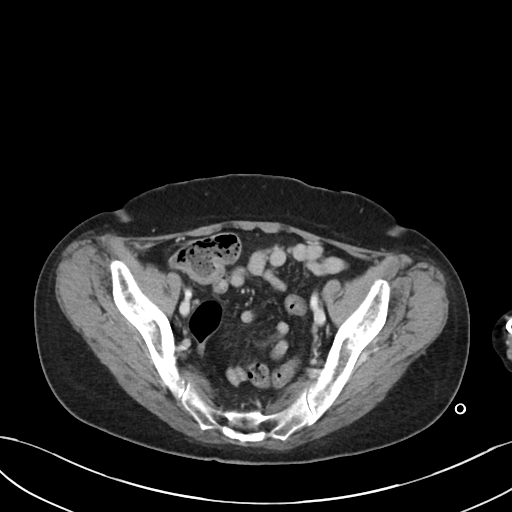
[im 33/78  soft-tissue]
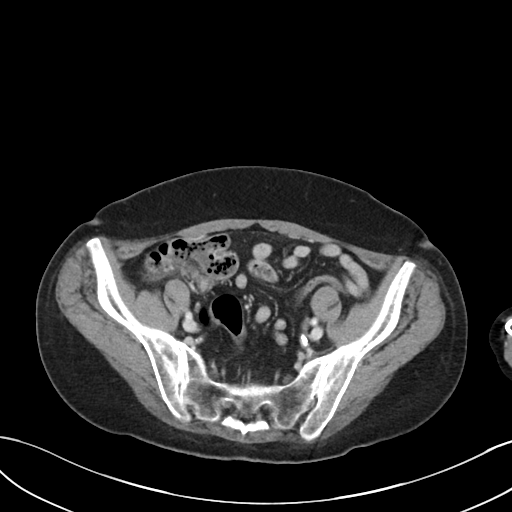
[im 41/78  soft-tissue]
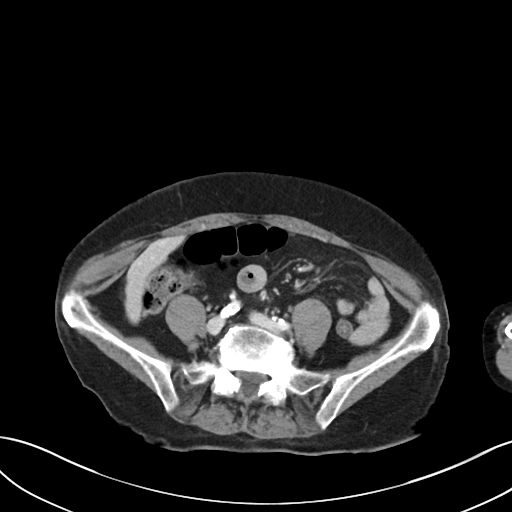
[im 45/78  soft-tissue]
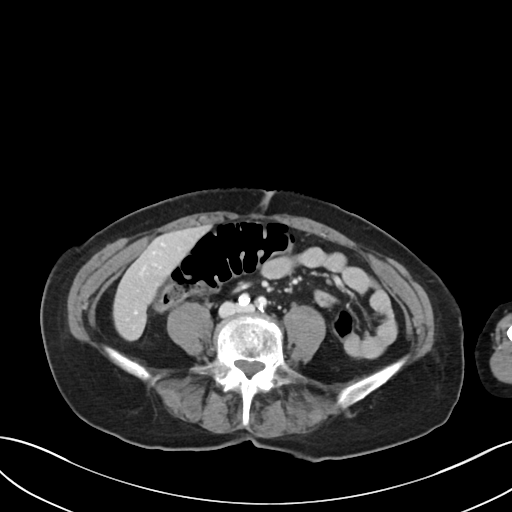
[im 49/78  soft-tissue]
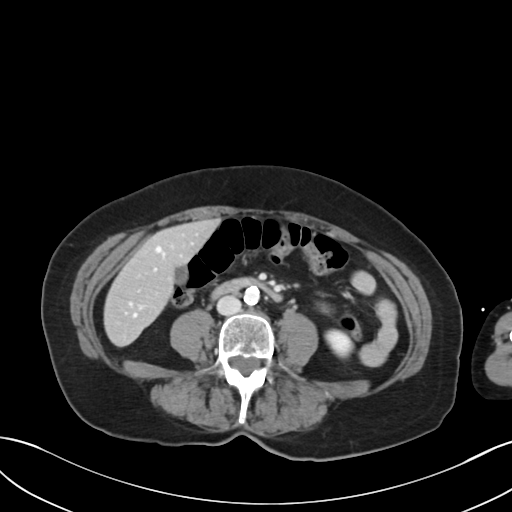
[im 49/78  bone]
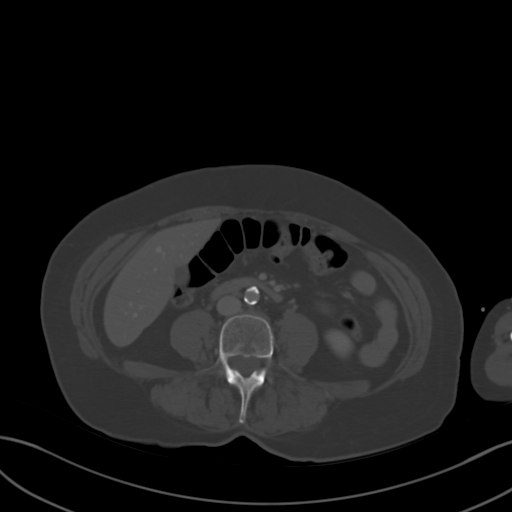
[im 57/78  soft-tissue]
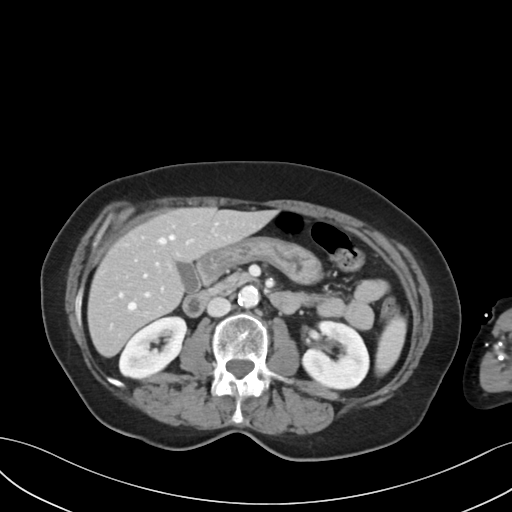
[im 61/78  soft-tissue]
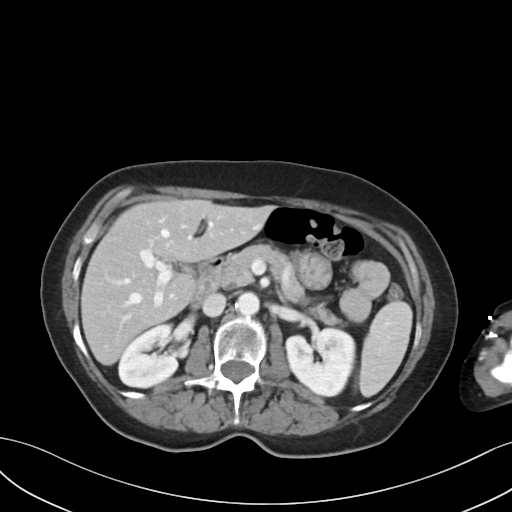
[im 65/78  soft-tissue]
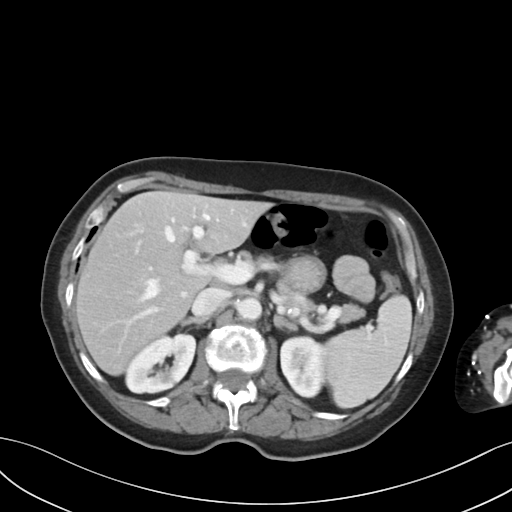
[im 73/78  soft-tissue]
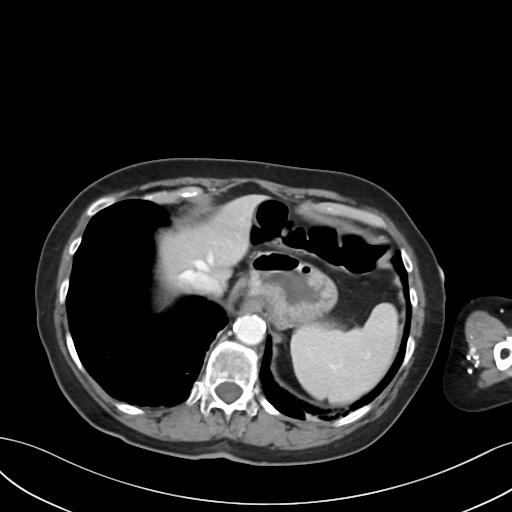

[Series 5: coronal · coronal · 0.71mm/px · 3 of 76 slices shown]
[im 26/76  soft-tissue]
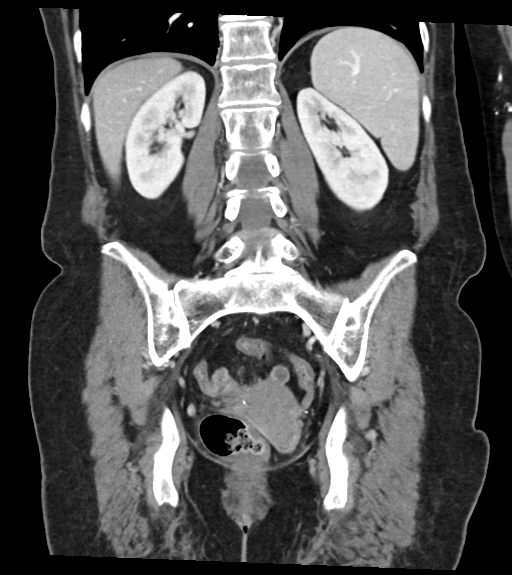
[im 34/76  soft-tissue]
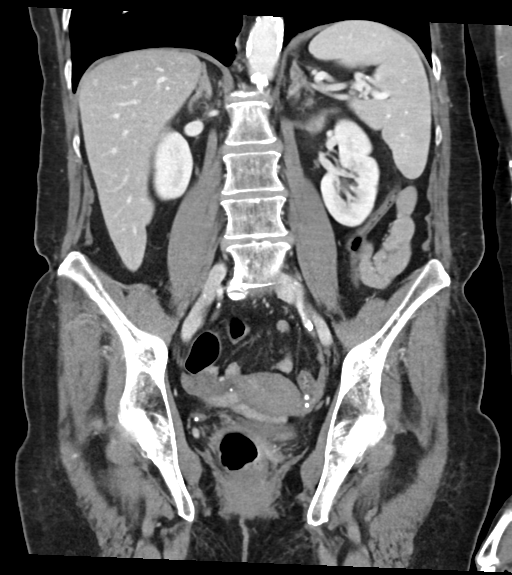
[im 42/76  soft-tissue]
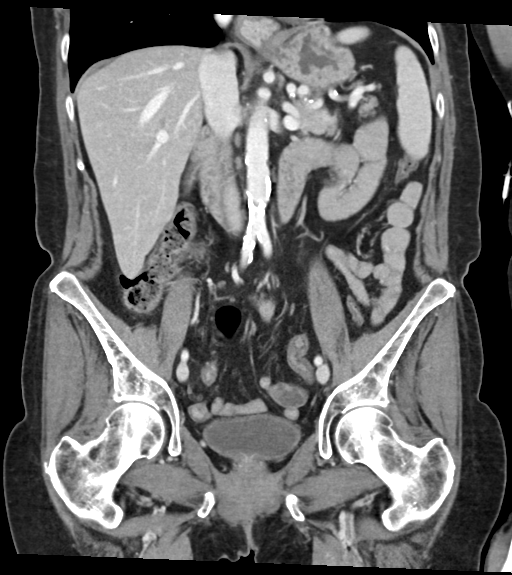

[16 of 46 positions shown; findings below may reference images not displayed]

FINDINGS: Lower chest: Centrilobular emphysematous changes.  Hiatal hernia.

Hepatobiliary: No focal liver abnormality. No gallstones,
gallbladder wall thickening, or pericholecystic fluid. No biliary
dilatation.

Pancreas: No focal lesion. Normal pancreatic contour. No surrounding
inflammatory changes. No main pancreatic ductal dilatation.

Spleen: Normal in size without focal abnormality.

Adrenals/Urinary Tract:

No adrenal nodule bilaterally.

Bilateral kidneys enhance symmetrically. There is a 1.9 cm fluid
density lesion right kidney likely represents renal cyst. No
hydronephrosis. No hydroureter.

The urinary bladder is unremarkable.

On delayed imaging, there is no urothelial wall thickening and there
are no filling defects in the opacified portions of the bilateral
collecting systems or ureters.

Stomach/Bowel: Stomach is within normal limits. No evidence of bowel
wall thickening or dilatation. No pneumatosis. Appendix appears
normal.

Vascular/Lymphatic: Stable peripherally calcified 9 mm splenic
artery aneurysm. No abdominal aorta or iliac aneurysm. Severe
calcified and atherosclerotic plaque of the aorta and its branches.
No abdominal, pelvic, or inguinal lymphadenopathy.

Reproductive: Uterus and bilateral adnexa are unremarkable.

Other: No intraperitoneal free fluid. No intraperitoneal free gas.
No organized fluid collection.

Musculoskeletal:

No abdominal wall hernia or abnormality.

No suspicious lytic or blastic osseous lesions. No acute displaced
fracture. Multilevel degenerative changes of the spine.
IMPRESSION: 1. Small hiatal hernia.
2. No acute intra-abdominal or intrapelvic abnormality.
3. Aortic Atherosclerosis (7IA1F-AWK.K) and Emphysema ([id number].T).

## 2023-04-20 IMAGING — MR MR LUMBAR SPINE W/O CM
4 of 5 series · 19 of 48 positions shown · non-contrast
Comparison: Radiography 07/16/2014.  CT 09/07/2007.

CLINICAL DATA: Low back pain. Progressive neurologic deficit.
History of small cell lung carcinoma [id number].

EXAM:
MRI LUMBAR SPINE WITHOUT CONTRAST
TECHNIQUE: Multiplanar, multisequence MR imaging of the lumbar spine was
performed. No intravenous contrast was administered.

[Series 2: T2 · sagittal · 4.0mm · 0.55mm/px · 6 of 17 slices shown (1 of 2)]
[im 1/17]
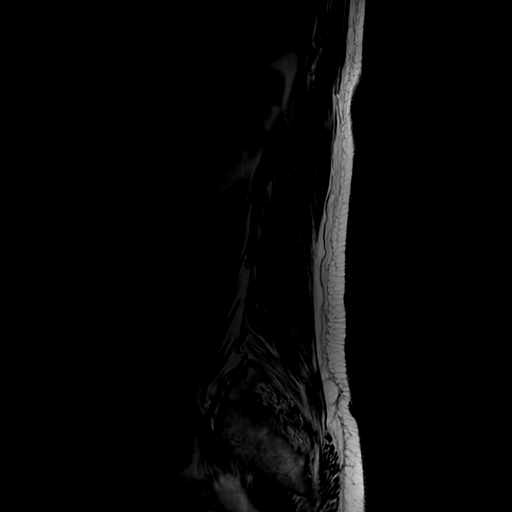
[im 4/17]
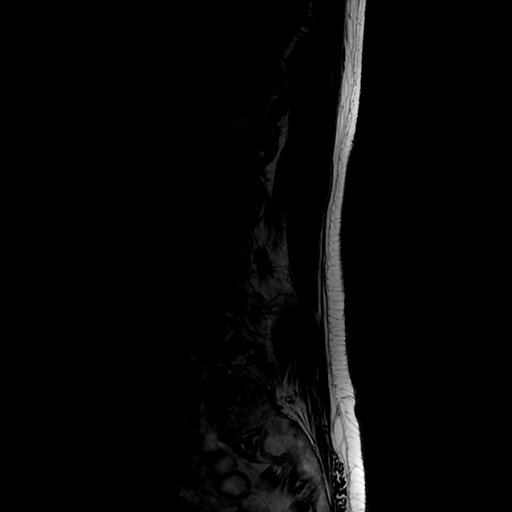
[im 7/17]
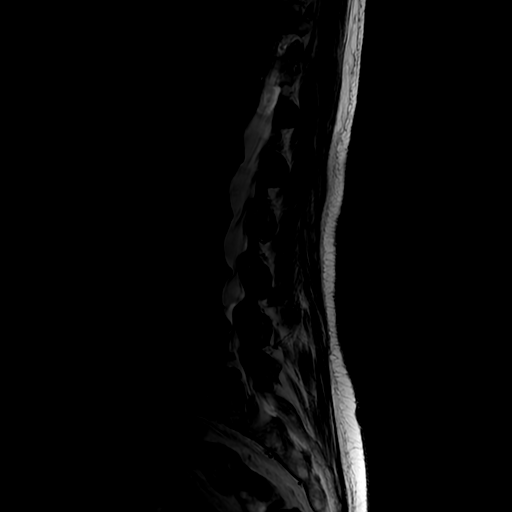
[im 10/17]
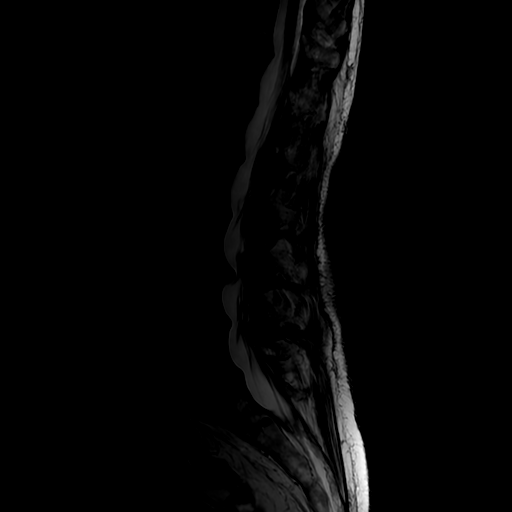
[im 13/17]
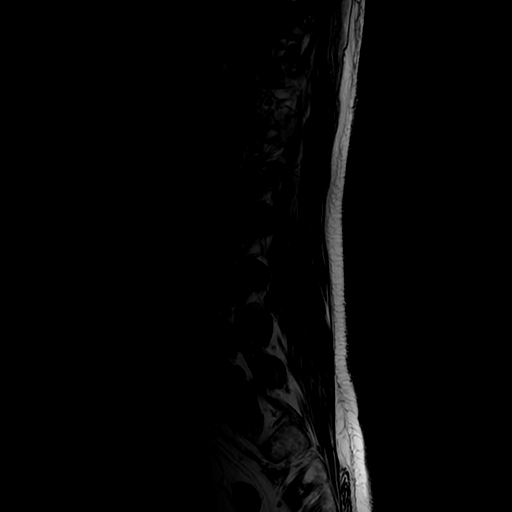
[im 17/17]
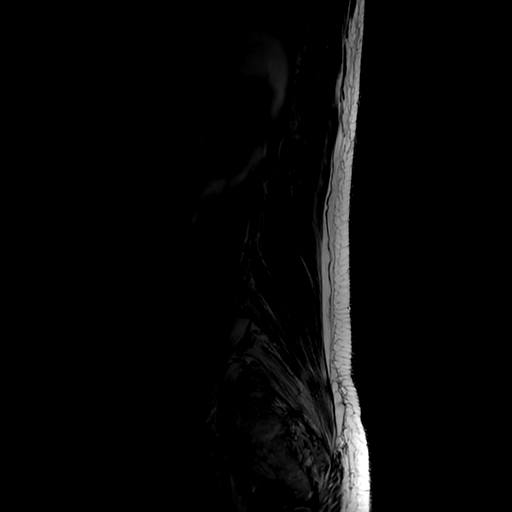

[Series 4: T1 · sagittal · 4.0mm · 0.47mm/px · 3 of 17 slices shown (1 of 2)]
[im 4/17]
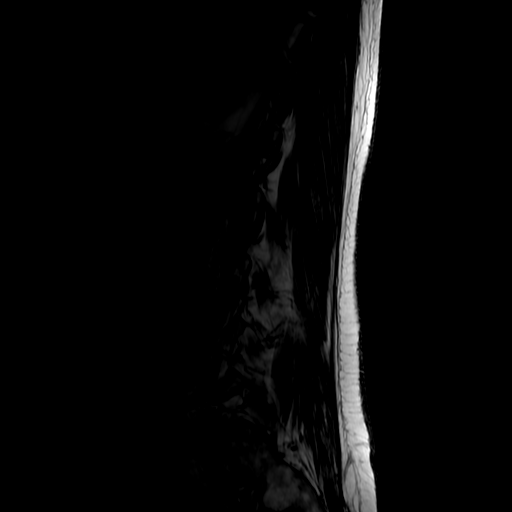
[im 10/17]
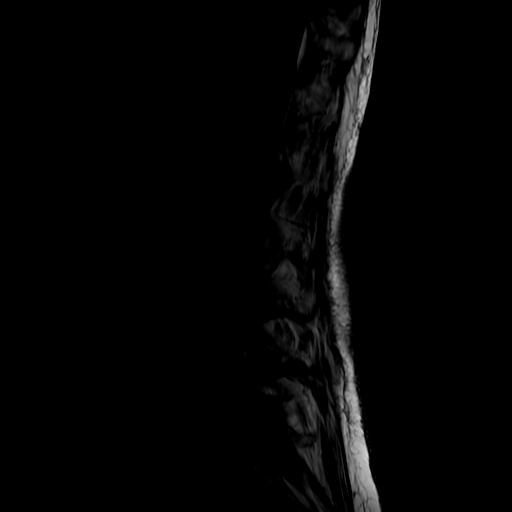
[im 17/17]
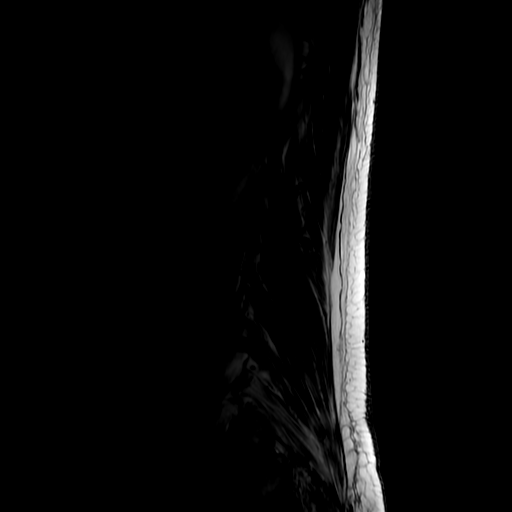

[Series 5: T2 · axial · 4.0mm · 0.39mm/px · z∈[-43,+122]mm · 7 of 40 slices shown (2 of 2)]
[im 1/40]
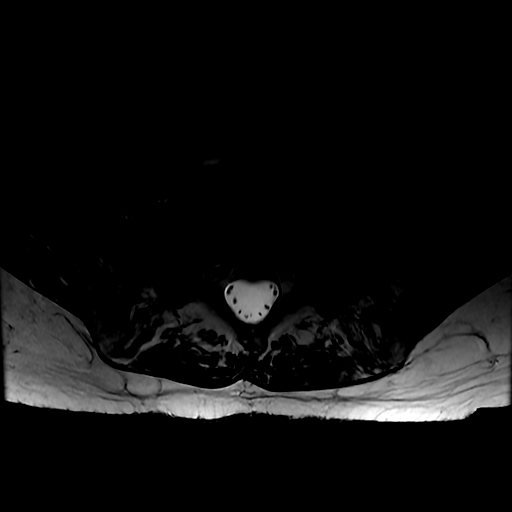
[im 6/40]
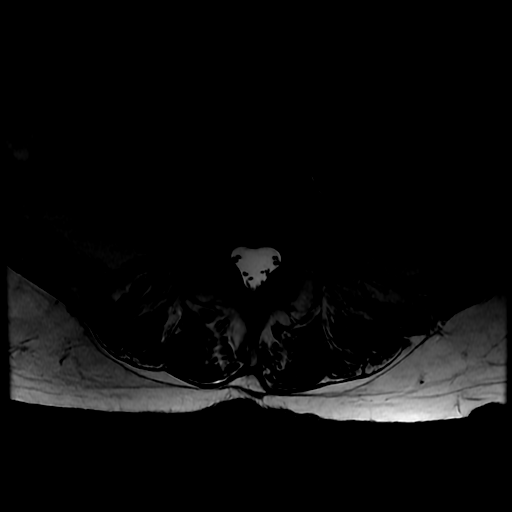
[im 12/40]
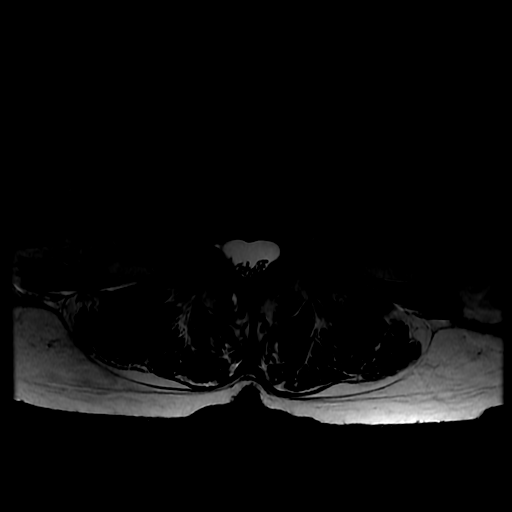
[im 17/40]
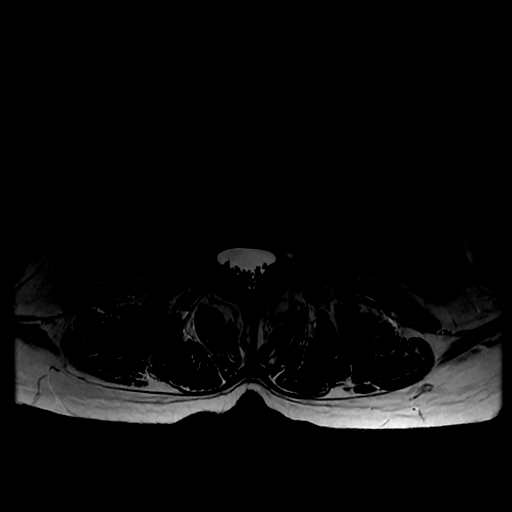
[im 20/40]
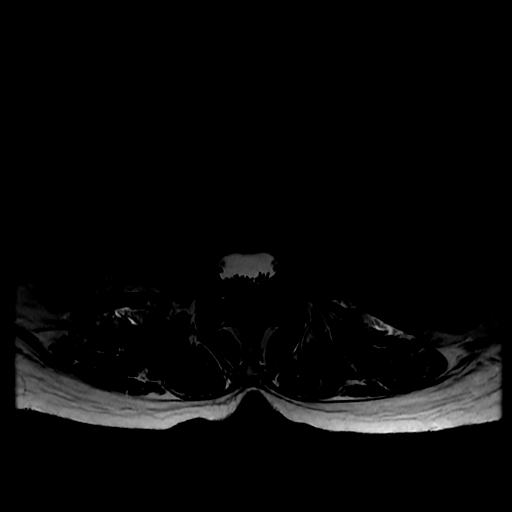
[im 23/40]
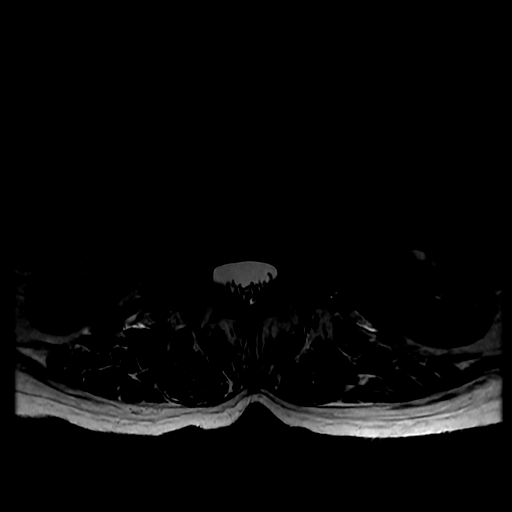
[im 34/40]
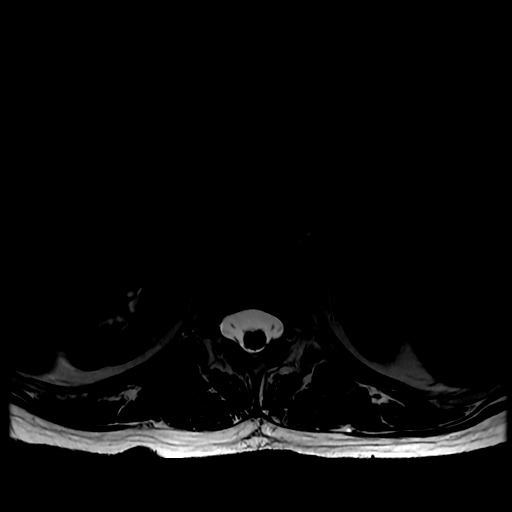

[Series 6: T1 · axial · 4.0mm · 0.39mm/px · z∈[-18,+122]mm · 3 of 40 slices shown (2 of 2)]
[im 6/40]
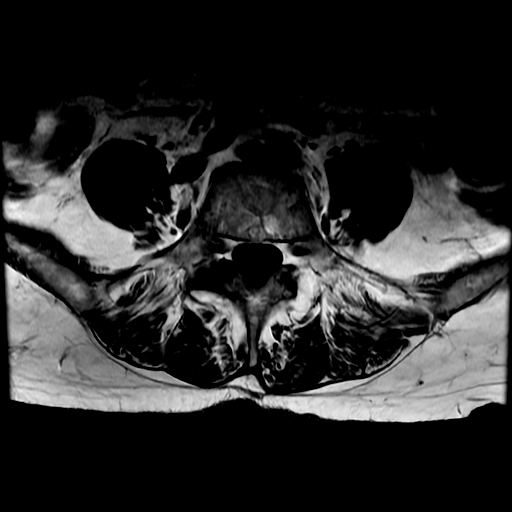
[im 20/40]
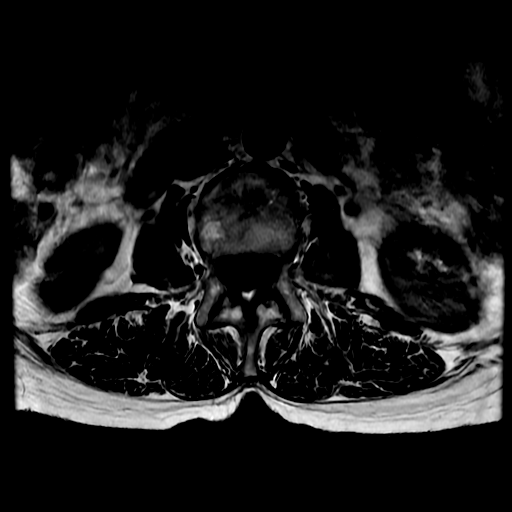
[im 34/40]
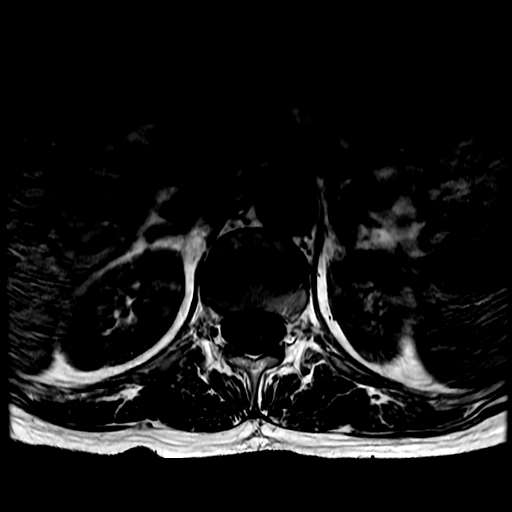

[19 of 48 positions shown; findings below may reference images not displayed]

FINDINGS: Segmentation:  5 lumbar type vertebral bodies.

Alignment:  No malalignment.

Vertebrae: No fracture or focal bone lesion. Insignificant superior
endplate Schmorl's node at L2.

Conus medullaris and cauda equina: Conus extends to the L1-2 level.
Conus and cauda equina appear normal.

Paraspinal and other soft tissues: Right renal cyst.

Disc levels:

Minimal non-compressive disc bulges at L2-3 and above. No canal or
foraminal stenosis.

L3-4: Broad-based disc herniation more prominent in the left
posterolateral direction. Stenosis of the left lateral recess that
could compress the left L4 nerve.

L4-5: Mild bulging of the disc. Mild facet and ligamentous
prominence. Mild narrowing of the lateral recesses without visible
neural compression.

L5-S1: Mild bulging of the disc. Mild facet hypertrophy. No
stenosis.
IMPRESSION: L3-4: Broad-based disc herniation more prominent in the left
posterolateral direction. Stenosis of the left lateral recess that
could compress the left L4 nerve.

Mild, non-compressive disc bulges at the other levels as outlined
above.

Notable absence of discogenic endplate marrow edema or edematous
facet arthropathy.

## 2023-05-03 NOTE — Progress Notes (Unsigned)
Cardiology Office Note    Date:  05/06/2023  ID:  [name], DOB [birth year], MRN [id number] PCP:  [name], MD  Cardiologist:  [name], MD  Electrophysiologist:  None   Chief Complaint: Follow up for CAD   History of Present Illness: Michele [name]    Michele [name] is a 67 y.o. female with visit-pertinent history of CAD, chronic HFpEF, chronic venous insufficiency, abdominal aortic and bilateral iliac atherosclerosis, hypertension, orthostatic hypotension, hyperlipidemia, COPD, GERD, hiatal hernia, depression, lung cancer, tobacco abuse.  She has a history of coronary artery disease dating back to 2013.  She had a STEMI in 10/2011, treated with overlapping DES x 2 to the left circumflex-OM 3.  1 month later she had a staged PCI with DES to mid RCA.  In 11/2012 she has noted to have unstable angina, underwent cardiac catheterization that showed a patent stent in the mid RCA and severe in-stent restenosis in the previously placed stent in the left circumflex/OM 3.  Patient underwent successful complex bifurcation PCI with DES.  She underwent heart catheterization from 02/2013 which showed patent mid RCA stent, but diffuse in-stent restenosis in the distal left circumflex.  It was felt that repeat PCI would not offer any long-term benefit so patient was managed medically.  She did nuclear stress test in 02/2022 that was normal, low restudy without signs of ischemia or infarction.  She was seen by cardiology in 06/2022, she noted intermittent swelling and pain in bilateral lower extremities.  She also complained of chronic dyspnea but noticed increased trouble breathing on exertion.  She underwent bilateral lower extremity ultrasounds that showed no evidence of blood clot but evidence of venous insufficiency, she was instructed to wear compression socks and continue to elevate her feet to help with swelling.  Evaluation of lower extremity pain she underwent bilateral ABIs which were within normal  range.  Patient had an abdominal ultrasound on 06/28/2022 that showed no evidence of abdominal aortic aneurysm and no significant stenosis of the iliac arteries.  Echocardiogram on 07/10/2022 showed LVEF of 60 to 65%, no RWMA, grade 1 diastolic dysfunction, normal RV systolic function.  In setting of atypical chest pain she underwent nuclear stress test in 10/2022, this was a low risk study without evidence of ischemia, EF was 64%.  She wore a cardiac monitor that showed sinus rhythm with heart rate ranging from 53 to 126 bpm, occasional PACs, rare PVCs, no sustained arrhythmias were noted.  Symptoms were associated with sinus rhythm and PACs.  She was last seen in clinic on 11/20/2022, she reported difficulty when swallowing and hip/knee pain.  Her physical activity is limited by leg pain.  She was in the process of scheduling an EGD and colonoscopy for evaluation, continued to smoke cigarettes.  Today she presents for follow up. She reports that she has been having left-sided chest discomfort, on further discussion this sounds musculoskeletal in origin.  She notes that she has been following with her PCP for left breast pain, notes that she is potentially to have an ultrasound.  She reports that the discomfort is worse with palpation or when moving around her left arm but also occurs at rest. She also reports that she has had ongoing hip and left leg pain, she questions if she has had an infection related to her surgery and broken hip last year. Her mobility has been severely limited by her hip and leg pain.  Today she also reports problems with frequent urination, she has  stopped Lasix due to this, plans to follow-up further with her PCP.  Labwork independently reviewed: 01/30/2023: Sodium 139, potassium 4.1, creatinine 0.72, AST 12, ALT 6 ROS: .   Today she denies melena, hematuria, hemoptysis, diaphoresis, presyncope, syncope, orthopnea, and PND.  All other systems are reviewed and otherwise  negative. Studies Reviewed: Michele [name]    EKG:  EKG is ordered today, personally reviewed, demonstrating  EKG Interpretation Date/Time:  Tuesday May 06 2023 15:36:36 EST Ventricular Rate:  78 PR Interval:  160 QRS Duration:  78 QT Interval:  392 QTC Calculation: 446 R Axis:   36  Text Interpretation: Normal sinus rhythm Normal ECG When compared with ECG of 20-Nov-2022 10:47, No significant change was found Confirmed by Reather Littler [phone]) on 05/06/2023 3:41:24 PM    CV Studies:  Cardiac Studies & Procedures     STRESS TESTS  MYOCARDIAL PERFUSION IMAGING 10/14/2022  Narrative   LV perfusion is normal. There is no evidence of ischemia. There is no evidence of infarction.   Left ventricular function is normal. Nuclear stress EF: 64 %. The left ventricular ejection fraction is normal (55-65%). End diastolic cavity size is normal.   The study is normal. The study is low risk.   ECHOCARDIOGRAM  ECHOCARDIOGRAM COMPLETE 07/10/2022  Narrative ECHOCARDIOGRAM REPORT    Patient Name:   Michele [name] Date of Exam: 07/10/2022 Medical Rec #:  [id number]       Height:       66.0 in Accession #:    [id number]      Weight:       135.2 lb Date of Birth:  [birth year]      BSA:          1.693 m Patient Age:    67 years        BP:           110/68 mmHg Patient Gender: F               HR:           77 bpm. Exam Location:  [address]  Procedure: 2D Echo, 3D Echo, Cardiac Doppler and Color Doppler  Indications:    R06.00 Dyspnea  History:        Patient has prior history of Echocardiogram examinations, most recent 02/02/2021. CHF, CAD and Previous Myocardial Infarction, Signs/Symptoms:Dyspnea; Risk Factors:Family History of Coronary Artery Disease, Hypertension, Dyslipidemia and Current Smoker. Small Cell Lung Cancer with Radiation (2017), STEMI.  Sonographer:    Farrel Conners RDCS Referring Phys: EMILY C MONGE  IMPRESSIONS   1. Left ventricular ejection fraction, by estimation, is 60  to 65%. The left ventricle has normal function. The left ventricle has no regional wall motion abnormalities. Left ventricular diastolic parameters are consistent with Grade I diastolic dysfunction (impaired relaxation). 2. Right ventricular systolic function is normal. The right ventricular size is normal. 3. The mitral valve is normal in structure. Trivial mitral valve regurgitation. No evidence of mitral stenosis. 4. The aortic valve is tricuspid. There is mild calcification of the aortic valve. There is mild thickening of the aortic valve. Aortic valve regurgitation is not visualized. No aortic stenosis is present. 5. The inferior vena cava is normal in size with greater than 50% respiratory variability, suggesting right atrial pressure of 3 mmHg.  FINDINGS Left Ventricle: Left ventricular ejection fraction, by estimation, is 60 to 65%. The left ventricle has normal function. The left ventricle has no regional wall motion abnormalities. The left ventricular internal cavity size  was normal in size. There is no left ventricular hypertrophy. Left ventricular diastolic parameters are consistent with Grade I diastolic dysfunction (impaired relaxation). Normal left ventricular filling pressure.  Right Ventricle: The right ventricular size is normal. No increase in right ventricular wall thickness. Right ventricular systolic function is normal.  Left Atrium: Left atrial size was normal in size.  Right Atrium: Right atrial size was normal in size.  Pericardium: There is no evidence of pericardial effusion.  Mitral Valve: The mitral valve is normal in structure. Trivial mitral valve regurgitation. No evidence of mitral valve stenosis.  Tricuspid Valve: The tricuspid valve is normal in structure. Tricuspid valve regurgitation is not demonstrated. No evidence of tricuspid stenosis.  Aortic Valve: The aortic valve is tricuspid. There is mild calcification of the aortic valve. There is mild thickening  of the aortic valve. Aortic valve regurgitation is not visualized. No aortic stenosis is present.  Pulmonic Valve: The pulmonic valve was normal in structure. Pulmonic valve regurgitation is not visualized. No evidence of pulmonic stenosis.  Aorta: The aortic root is normal in size and structure.  Venous: The inferior vena cava is normal in size with greater than 50% respiratory variability, suggesting right atrial pressure of 3 mmHg.  IAS/Shunts: No atrial level shunt detected by color flow Doppler.   LEFT VENTRICLE PLAX 2D LVIDd:         3.70 cm   Diastology LVIDs:         1.80 cm   LV e' medial:    6.10 cm/s LV PW:         0.80 cm   LV E/e' medial:  6.6 LV IVS:        0.80 cm   LV e' lateral:   7.07 cm/s LVOT diam:     1.90 cm   LV E/e' lateral: 5.7 LV SV:         48 LV SV Index:   28 LVOT Area:     2.84 cm  3D Volume EF: 3D EF:        61 % LV EDV:       100 ml LV ESV:       39 ml LV SV:        61 ml  RIGHT VENTRICLE RV Basal diam:  3.80 cm RV S prime:     13.10 cm/s TAPSE (M-mode): 2.1 cm  LEFT ATRIUM             Index        RIGHT ATRIUM           Index LA diam:        2.80 cm 1.65 cm/m   RA Area:     13.90 cm LA Vol (A2C):   29.4 ml 17.36 ml/m  RA Volume:   33.30 ml  19.67 ml/m LA Vol (A4C):   20.1 ml 11.87 ml/m LA Biplane Vol: 24.5 ml 14.47 ml/m AORTIC VALVE LVOT Vmax:   78.85 cm/s LVOT Vmean:  53.450 cm/s LVOT VTI:    0.168 m  AORTA Ao Root diam: 3.20 cm  MITRAL VALVE MV Area (PHT): cm         SHUNTS MV Decel Time: 247 msec    Systemic VTI:  0.17 m MV E velocity: 40.35 cm/s  Systemic Diam: 1.90 cm MV A velocity: 73.60 cm/s MV E/A ratio:  0.55  Chilton Si MD Electronically signed by Chilton Si MD Signature Date/Time: 07/10/2022/3:13:13 PM    Final  MONITORS  LONG TERM MONITOR (3-14 DAYS) 11/05/2022  Narrative   Combined Zio patch monitor report: Total of 8 days, 21 hours.  (May 2024)   The patient was in sinus rhythm with a  heart rate range of 53 to 126 bpm, average 76 bpm.   Occasional (2.6 -3.5%) Premature Atrial Contractions (PACs) with rare couplets and triplets.  Also rare (<1%) Premature Ventricular Contractions PVCs   2 atrial runs of 5 and 6 beats. #1) 6 beat run with a heart rate range of 119-160 bpm-average 148 bpm (lasted 2.3 sec)'; #2) 6 beat run with a range of 117 to 121 bpm, and average 119 bpm (lasted 2.8 sec).  => Neither of these episodes were noted on patient triggered report.   No Sustained Arrhythmias: Atrial Tachycardia (AT), Supraventricular Tachycardia (SVT), Atrial Fibrillation (A-Fib), Atrial Flutter (A-Flutter), Sustained Ventricular Tachycardia (VT)   No significant slow heart rate/bradycardia or sustained pauses.   Symptoms were noted with some rhythm as well as sinus rhythm with PACs or PVCs.--Benign findings.    Overall, as is the case with the last 3 studies, this is a benign study.  No arrhythmias noted.  The patient is having symptomatic PACs more than PVCs.  Treatment has been limited because low blood pressures.  Whenever we tried to use a medicine to treat these symptoms, her blood pressure went down.  They are benign and not likely to cause any problems.  Would prefer to avoid treating.  [name], MD           Current Reported Medications:.    Current Meds  Medication Sig   acetaminophen (TYLENOL) 325 MG tablet Take 2 tablets (650 mg total) by mouth every 6 (six) hours as needed for mild pain, fever or headache.   ALPRAZolam (XANAX) 1 MG tablet Take 1 mg by mouth as needed.   ARNUITY ELLIPTA 100 MCG/ACT AEPB Take 1 puff by mouth daily.   Calcium Citrate-Vitamin D (CITRACAL + D PO) Take 1 tablet by mouth in the morning and at bedtime.    clopidogrel (PLAVIX) 75 MG tablet TAKE 1 TABLET BY MOUTH EVERY MONDAY, WEDNESDAY AND FRIDAY   co-enzyme Q-10 30 MG capsule Take 30 mg by mouth daily.   Evolocumab (REPATHA SURECLICK) 140 MG/ML SOAJ Inject 140 mg into the skin every 14  (fourteen) days.   fluticasone (FLONASE) 50 MCG/ACT nasal spray INSTILL 1 SPRAY INTO BOTH NOSTRILS DAILY   guaiFENesin (MUCINEX) 600 MG 12 hr tablet Take 1 tablet (600 mg total) by mouth 2 (two) times daily.   isosorbide mononitrate (IMDUR) 30 MG 24 hr tablet TAKE 1 TABLET(30 MG) BY MOUTH AT BEDTIME   levalbuterol (XOPENEX HFA) 45 MCG/ACT inhaler Inhale 2 puffs into the lungs every 6 (six) hours as needed for wheezing.   levalbuterol (XOPENEX) 0.63 MG/3ML nebulizer solution Take 3 mLs (0.63 mg total) by nebulization every 4 (four) hours as needed for wheezing or shortness of breath (dx: R00.2, J44.9).   Multiple Vitamins-Minerals (CENTRUM SILVER ULTRA WOMENS) TABS See admin instructions.   nitroGLYCERIN (NITROSTAT) 0.4 MG SL tablet PLACE 1 TABLET UNDER TONGUE EVERY 5 MINUTES AS NEEDED FOR CHEST PAIN   pantoprazole (PROTONIX) 40 MG tablet Take 1 tablet (40 mg total) by mouth daily.   polyethylene glycol (MIRALAX / GLYCOLAX) 17 g packet Take 17 g by mouth daily as needed for mild constipation.   pravastatin (PRAVACHOL) 80 MG tablet Take 1 tablet by mouth every Monday, Wednesday, Friday   Respiratory Therapy Supplies (FLUTTER)  DEVI 1 application by Does not apply route 2 (two) times daily.   RESTASIS 0.05 % ophthalmic emulsion Place 1 drop into both eyes 2 (two) times daily.    sodium chloride HYPERTONIC 3 % nebulizer solution Take by nebulization as needed for cough.   SPIRIVA HANDIHALER 18 MCG inhalation capsule Place 1 capsule (18 mcg total) into inhaler and inhale daily.   VASCEPA 1 g capsule TAKE 1 CAPSULE BY MOUTH TWICE DAILY   Physical Exam:    VS:  BP 108/62 (BP Location: Left Arm, Patient Position: Sitting, Cuff Size: Normal)   Pulse 78   Ht 5\' 5"  (1.651 m)   Wt 132 lb (59.9 kg)   BMI 21.97 kg/m    Wt Readings from Last 3 Encounters:  05/06/23 132 lb (59.9 kg)  12/16/22 135 lb 6.4 oz (61.4 kg)  11/21/22 134 lb (60.8 kg)    GEN: Well nourished, well developed in no acute  distress NECK: No JVD; No carotid bruits CARDIAC: RRR, no murmurs, rubs, gallops RESPIRATORY:  Coarse crackles in bases improve with deep breathing, otherwise clear to auscultation without rales, wheezing  ABDOMEN: Soft, non-tender, non-distended EXTREMITIES:  No edema; No acute deformity   Asessement and Plan:.    CAD/atypical chest pain: History of STEMI in 10/2011 that was treated with overlapping DES x 2 to the left circumflex, the following months she underwent staged PCI with DES to mid RCA.  In 11/2012 she had severe in-stent restenosis in previously placed stent in the left circumflex, treated with PCI and DES.  Most recent cardiac catheterization in 02/2013 showed patent mid RCA stent, diffuse in-stent restenosis that has been managed medically.  She underwent nuclear stress test in 10/2022 which was low risk study without evidence of ischemia. Today she reports left-sided chest pain that on further discussion is related to her left breast and worsens when she palpates the area, discussed that this is not cardiac in origin, likely musculoskeletal and recommended follow-up with her PCP. Continue Plavix, Repatha, Imdur, pravastatin and Vascepa.  Palpitations: In 09/2022 patient noted palpitations, outpatient monitor showed sinus rhythm with occasional PACs (2.6-3.5% burden)  and rare PVCs. She does note intermittent palpitations. Discussed that per Dr. [name] in the past have tried to start medications to treat palpitations her BP went down.  PACs are benign and not likely to cause any problems, will prefer to avoid treating.  HTN: Blood pressure today 108/62. Continue current antihypertensive regimen.   Abdominal aorta atherosclerosis/bilateral iliac stenosis/BLE pain: Ultrasound of aorta/IVC/iliac's from 06/28/2022 showed no evidence of abdominal aortic aneurysm, no evidence of significant stenosis in the iliac arteries.  ABIs from 06/2022 showed normal resting ABIs bilaterally.  Today she notes  left hip pain that has been ongoing since her surgery last year, feels as though "she is sitting on a ball". Recommended that she follow-up with her orthopedic surgeon. Continue Plavix and pravastatin/repatha. Tobacco cessation encouraged.   Chronic diastolic HF/Chronic venous insufficiency: Echocardiogram from 07/2022 showed LVEF 60 to 65% with grade 1 DD, extremity venous ultrasound from 06/2022 showed evidence of venous reflux.  She notes intermittent lower extremity edema, notes she is unable to wear compression stockings as she feels this causes her legs to swell more. Today she appears euvolemic and well compensated on exam. Encouraged elevation of lower extremities. She reports she is no longer on Lasix for urinary problems.   Hyperlipidemia: Last lipid profile on 06/2022 showed LDL 39, HDL 44, triglycerides 100 and total cholesterol 102.  Continue  Repatha, pravastatin and Vascepa. Will recheck fasting lipid profile and LFTs in January.   Tobacco use: Patient reports that she is currently smoking half a pack of cigarettes a day. Cessation encouraged.    Disposition: F/u with Dr. [name] in six months.   Signed, Rip Harbour, NP

## 2023-05-06 ENCOUNTER — Ambulatory Visit: Payer: Medicare HMO | Attending: Cardiology | Admitting: Cardiology

## 2023-05-06 ENCOUNTER — Encounter: Payer: Self-pay | Admitting: Cardiology

## 2023-05-06 VITALS — BP 108/62 | HR 78 | Ht 65.0 in | Wt 132.0 lb

## 2023-05-06 DIAGNOSIS — I700 Atherosclerosis of aorta: Secondary | ICD-10-CM

## 2023-05-06 DIAGNOSIS — I2510 Atherosclerotic heart disease of native coronary artery without angina pectoris: Secondary | ICD-10-CM | POA: Diagnosis not present

## 2023-05-06 DIAGNOSIS — I10 Essential (primary) hypertension: Secondary | ICD-10-CM | POA: Diagnosis not present

## 2023-05-06 DIAGNOSIS — M79604 Pain in right leg: Secondary | ICD-10-CM

## 2023-05-06 DIAGNOSIS — I5032 Chronic diastolic (congestive) heart failure: Secondary | ICD-10-CM

## 2023-05-06 DIAGNOSIS — E785 Hyperlipidemia, unspecified: Secondary | ICD-10-CM

## 2023-05-06 DIAGNOSIS — R002 Palpitations: Secondary | ICD-10-CM

## 2023-05-06 DIAGNOSIS — I872 Venous insufficiency (chronic) (peripheral): Secondary | ICD-10-CM

## 2023-05-06 DIAGNOSIS — M79605 Pain in left leg: Secondary | ICD-10-CM

## 2023-05-06 DIAGNOSIS — I771 Stricture of artery: Secondary | ICD-10-CM | POA: Diagnosis not present

## 2023-05-06 DIAGNOSIS — Z9861 Coronary angioplasty status: Secondary | ICD-10-CM | POA: Diagnosis not present

## 2023-05-06 NOTE — Patient Instructions (Addendum)
Medication Instructions:  No changes *If you need a refill on your cardiac medications before your next appointment, please call your pharmacy*  Lab Work: In mid January we will need fasting lipid panel and LFT If you have labs (blood work) drawn today and your tests are completely normal, you will receive your results only by: MyChart Message (if you have MyChart) OR A paper copy in the mail If you have any lab test that is abnormal or we need to change your treatment, we will call you to review the results.   Testing/Procedures: No testing  Follow-Up: At [facility], you and your health needs are our priority.  As part of our continuing mission to provide you with exceptional heart care, we have created designated Provider Care Teams.  These Care Teams include your primary Cardiologist (physician) and Advanced Practice Providers (APPs -  Physician Assistants and Nurse Practitioners) who all work together to provide you with the care you need, when you need it.  We recommend signing up for the patient portal called "MyChart".  Sign up information is provided on this After Visit Summary.  MyChart is used to connect with patients for Virtual Visits (Telemedicine).  Patients are able to view lab/test results, encounter notes, upcoming appointments, etc.  Non-urgent messages can be sent to your provider as well.   To learn more about what you can do with MyChart, go to [email or web address].    Your next appointment:   6 month(s)  Provider:   [name], MD

## 2023-05-16 DIAGNOSIS — Z9861 Coronary angioplasty status: Secondary | ICD-10-CM | POA: Diagnosis not present

## 2023-05-16 DIAGNOSIS — I2510 Atherosclerotic heart disease of native coronary artery without angina pectoris: Secondary | ICD-10-CM | POA: Diagnosis not present

## 2023-05-16 DIAGNOSIS — M79605 Pain in left leg: Secondary | ICD-10-CM | POA: Diagnosis not present

## 2023-05-16 DIAGNOSIS — R002 Palpitations: Secondary | ICD-10-CM | POA: Diagnosis not present

## 2023-05-16 DIAGNOSIS — I771 Stricture of artery: Secondary | ICD-10-CM | POA: Diagnosis not present

## 2023-05-16 DIAGNOSIS — I5032 Chronic diastolic (congestive) heart failure: Secondary | ICD-10-CM | POA: Diagnosis not present

## 2023-05-16 DIAGNOSIS — I872 Venous insufficiency (chronic) (peripheral): Secondary | ICD-10-CM | POA: Diagnosis not present

## 2023-05-16 DIAGNOSIS — M79604 Pain in right leg: Secondary | ICD-10-CM | POA: Diagnosis not present

## 2023-05-16 DIAGNOSIS — I700 Atherosclerosis of aorta: Secondary | ICD-10-CM | POA: Diagnosis not present

## 2023-05-17 LAB — LIPID PANEL
Chol/HDL Ratio: 2.3 {ratio} (ref 0.0–4.4)
Cholesterol, Total: 120 mg/dL (ref 100–199)
HDL: 52 mg/dL (ref >39)
LDL Chol Calc (NIH): 50 mg/dL (ref 0–99)
Triglycerides: 95 mg/dL (ref 0–149)
VLDL Cholesterol Cal: 18 mg/dL (ref 5–40)

## 2023-05-17 LAB — HEPATIC FUNCTION PANEL
ALT: 8 [IU]/L (ref 0–32)
AST: 15 [IU]/L (ref 0–40)
Albumin: 4.3 g/dL (ref 3.9–4.9)
Alkaline Phosphatase: 82 [IU]/L (ref 44–121)
Bilirubin Total: 0.3 mg/dL (ref 0.0–1.2)
Bilirubin, Direct: 0.12 mg/dL (ref 0.00–0.40)
Total Protein: 6.5 g/dL (ref 6.0–8.5)

## 2023-05-19 ENCOUNTER — Other Ambulatory Visit: Payer: Self-pay

## 2023-05-19 ENCOUNTER — Telehealth: Payer: Self-pay | Admitting: Cardiology

## 2023-05-19 MED ORDER — PRAVASTATIN SODIUM 80 MG PO TABS: ORAL_TABLET | ORAL | 3 refills | Status: DC

## 2023-05-19 MED ORDER — ISOSORBIDE MONONITRATE ER 30 MG PO TB24: ORAL_TABLET | ORAL | 3 refills | Status: DC

## 2023-05-19 NOTE — Telephone Encounter (Signed)
Called patient advised of below they verbalized understanding.

## 2023-05-19 NOTE — Telephone Encounter (Signed)
 Called patient with no answer. Left message to return call.

## 2023-05-19 NOTE — Telephone Encounter (Signed)
*  STAT* If patient is at the pharmacy, call can be transferred to refill team.   1. Which medications need to be refilled? (please list name of each medication and dose if known)   isosorbide mononitrate (IMDUR) 30 MG 24 hr tablet  pravastatin (PRAVACHOL) 80 MG tablet   2. Would you like to learn more about the convenience, safety, & potential cost savings by using the [facility]?   3. Are you open to using the [facility] (Type [facility]. ).  4. Which pharmacy/location (including street and city if local pharmacy) is medication to be sent to?  [id number] - [place], [place] - [address] AT [facility] OF [address]   5. Do they need a 30 day or 90 day supply?   90 day  Patient stated she still has some medication.

## 2023-05-19 NOTE — Telephone Encounter (Signed)
Pt is requesting a callback regarding her results. Please advise

## 2023-05-19 NOTE — Telephone Encounter (Signed)
-----   Message from Rip Harbour sent at 05/18/2023 11:43 AM EST ----- Please let Michele [name] know that her liver function is normal. Her cholesterol is well controlled. Continue current medications.

## 2023-05-20 DIAGNOSIS — R32 Unspecified urinary incontinence: Secondary | ICD-10-CM | POA: Diagnosis not present

## 2023-07-03 DIAGNOSIS — N3281 Overactive bladder: Secondary | ICD-10-CM | POA: Diagnosis not present

## 2023-07-04 ENCOUNTER — Telehealth: Payer: Self-pay | Admitting: Cardiology

## 2023-07-04 IMAGING — DX DG CHEST 2V
2 series · 2 of 2 positions shown · non-contrast
Comparison: December 20, 2020.

CLINICAL DATA: Chest pain.

EXAM:
CHEST - 2 VIEW

[chest pa]
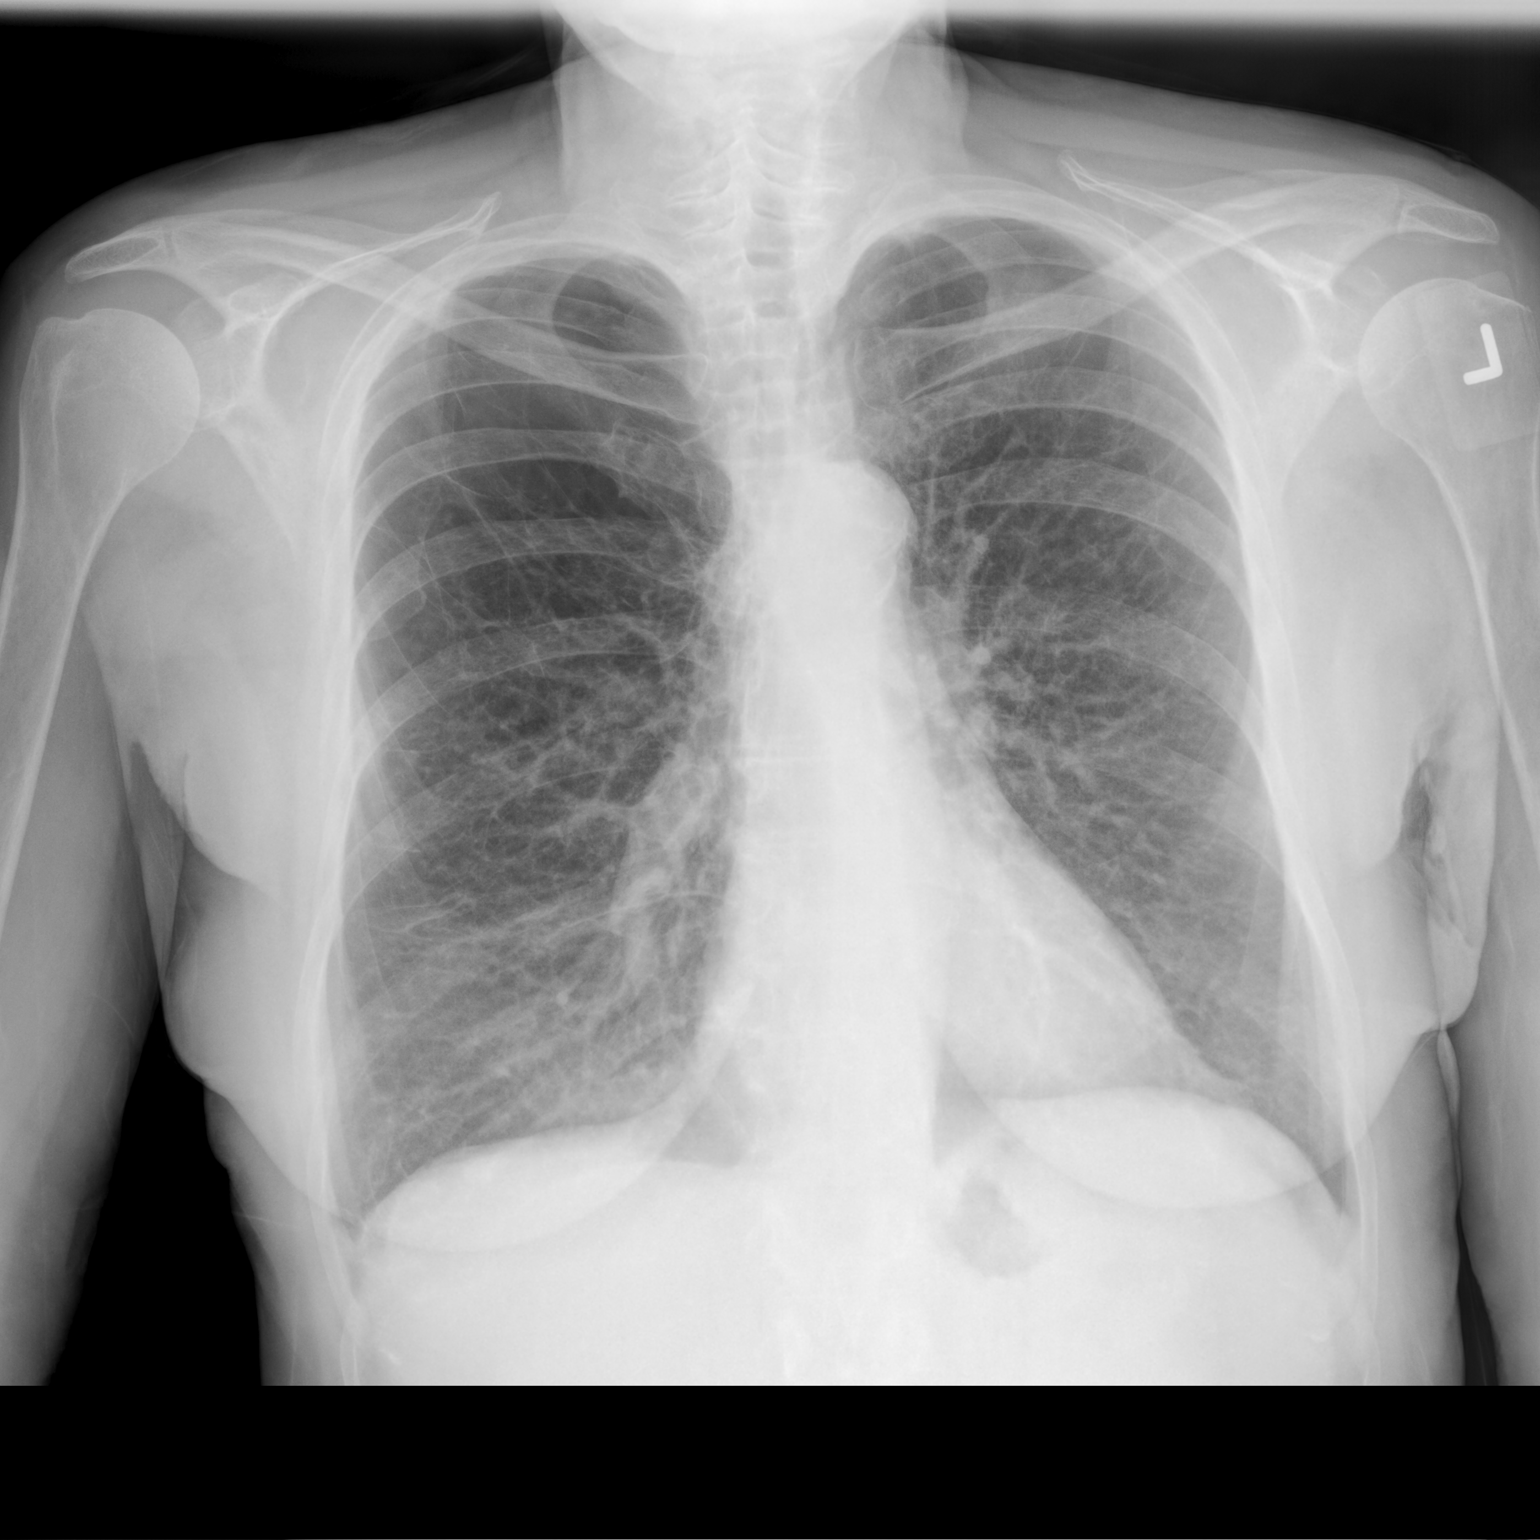

[chest lat]
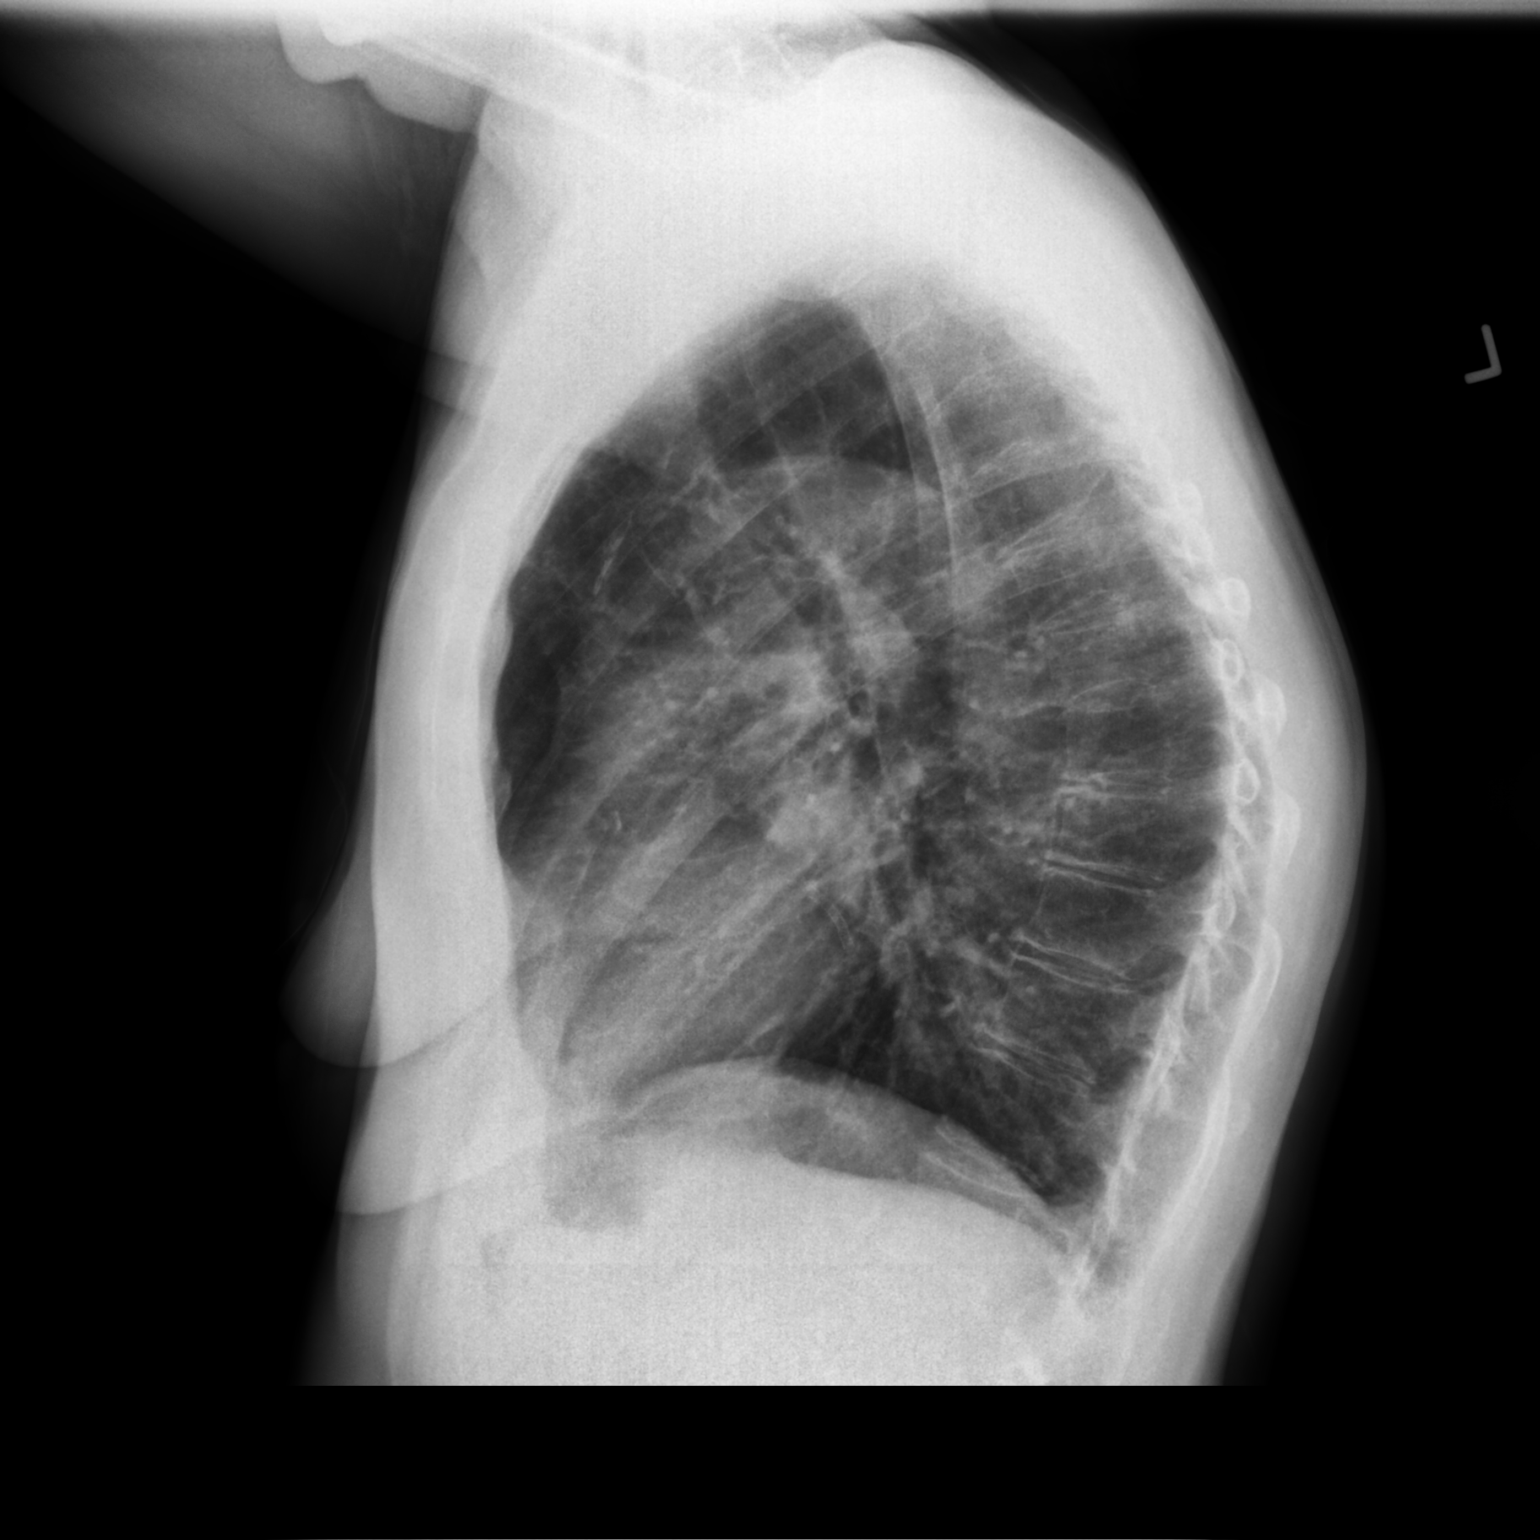

[2 of 2 positions shown; findings below may reference images not displayed]

FINDINGS: The heart size and mediastinal contours are within normal limits.
Emphysematous disease is noted in the right upper lobe. No
consolidative process is noted. The visualized skeletal structures
are unremarkable.
IMPRESSION: No acute cardiopulmonary abnormality seen.

Emphysema ([id number]).

## 2023-07-04 NOTE — Telephone Encounter (Addendum)
*  STAT* If patient is at the pharmacy, call can be transferred to refill team.   1. Which medications need to be refilled? (please list name of each medication and dose if known) clopidogrel (PLAVIX) 75 MG tablet    2. Would you like to learn more about the convenience, safety, & potential cost savings by using the [facility]?    3. Are you open to using the [facility] (Type [facility]. ).   4. Which pharmacy/location (including street and city if local pharmacy) is medication to be sent to?  [facility] #[id number] - [place], [place] - [address] AT [facility] OF [address]     5. Do they need a 30 day or 90 day supply? 90 day  Patient is out of medication

## 2023-07-07 MED ORDER — CLOPIDOGREL BISULFATE 75 MG PO TABS: ORAL_TABLET | ORAL | 3 refills | Status: DC

## 2023-07-09 ENCOUNTER — Telehealth (HOSPITAL_BASED_OUTPATIENT_CLINIC_OR_DEPARTMENT_OTHER): Payer: Self-pay | Admitting: Pulmonary Disease

## 2023-07-09 MED ORDER — LEVALBUTEROL TARTRATE 45 MCG/ACT IN AERO: 2.0000 | INHALATION_SPRAY | Freq: Four times a day (QID) | RESPIRATORY_TRACT | 12 refills | Status: DC | PRN

## 2023-07-09 NOTE — Telephone Encounter (Signed)
Xopenex refill sent to pharmacy.

## 2023-07-09 NOTE — Telephone Encounter (Signed)
 Please advise Dr Villa Greaser as she is set to see you 4/3.

## 2023-07-09 NOTE — Telephone Encounter (Signed)
 Patient called stating her levalbuterol  (XOPENEX  HFA) 45 MCG/ACT inhaler [[id number]] was not approved and she is almost out now.   LOV 11/2022 with Dr. Shellia.  New patient appointment not available until 09/04/2023 which has been scheduled. Please advise and call patient back if needed   Pharmacy: [facility] #[id number] - [place], [place] - [address] AT [facility] OF [address]

## 2023-07-11 ENCOUNTER — Telehealth: Payer: Self-pay | Admitting: Cardiology

## 2023-07-11 NOTE — Telephone Encounter (Signed)
 Patient identification verified by 2 forms. Michele Cooks, RN    Called and spoke to patient  Patient states:   -she has leg pain   -foot started swelling a few weeks ago   -some toes in left leg have blue color discoloration   -has pain in groin   -has weakness in legs, worse in left leg  -sometimes has numbness/tingling in legs   -unsure if this is a vascular issue   -she has worsening SOB  Informed patient be best to present to ED, patient declined, requests OV with Dr. Anner  Patient scheduled for 2/11 at 8:40am  Reviewed strict ED warning signs/precautions  Patient verbalized understanding, no questions at this time

## 2023-07-11 NOTE — Telephone Encounter (Signed)
 Pt is requesting cb to discuss bi lat leg pain x 39month which has worsened. Swelling in left foot. Pain in groin as well. Asking about vascular testing

## 2023-07-15 ENCOUNTER — Ambulatory Visit: Payer: Medicare HMO | Admitting: Cardiology

## 2023-07-21 ENCOUNTER — Telehealth: Payer: Self-pay | Admitting: Medical Oncology

## 2023-07-21 ENCOUNTER — Encounter: Payer: Self-pay | Admitting: Physician Assistant

## 2023-07-21 ENCOUNTER — Ambulatory Visit: Payer: Medicare HMO | Attending: Physician Assistant | Admitting: Physician Assistant

## 2023-07-21 VITALS — BP 128/70 | HR 72 | Ht 65.0 in | Wt 134.4 lb

## 2023-07-21 DIAGNOSIS — M79605 Pain in left leg: Secondary | ICD-10-CM

## 2023-07-21 DIAGNOSIS — R531 Weakness: Secondary | ICD-10-CM | POA: Diagnosis not present

## 2023-07-21 DIAGNOSIS — M79604 Pain in right leg: Secondary | ICD-10-CM | POA: Diagnosis not present

## 2023-07-21 DIAGNOSIS — R1084 Generalized abdominal pain: Secondary | ICD-10-CM | POA: Diagnosis not present

## 2023-07-21 DIAGNOSIS — R9431 Abnormal electrocardiogram [ECG] [EKG]: Secondary | ICD-10-CM | POA: Diagnosis not present

## 2023-07-21 DIAGNOSIS — I25119 Atherosclerotic heart disease of native coronary artery with unspecified angina pectoris: Secondary | ICD-10-CM | POA: Diagnosis not present

## 2023-07-21 DIAGNOSIS — R1032 Left lower quadrant pain: Secondary | ICD-10-CM | POA: Diagnosis not present

## 2023-07-21 DIAGNOSIS — I10 Essential (primary) hypertension: Secondary | ICD-10-CM | POA: Diagnosis not present

## 2023-07-21 DIAGNOSIS — Z79899 Other long term (current) drug therapy: Secondary | ICD-10-CM | POA: Diagnosis not present

## 2023-07-21 DIAGNOSIS — R58 Hemorrhage, not elsewhere classified: Secondary | ICD-10-CM | POA: Diagnosis not present

## 2023-07-21 DIAGNOSIS — K625 Hemorrhage of anus and rectum: Secondary | ICD-10-CM | POA: Diagnosis not present

## 2023-07-21 MED ORDER — ISOSORBIDE MONONITRATE ER 60 MG PO TB24: 60.0000 mg | ORAL_TABLET | Freq: Every day | ORAL | 3 refills | Status: DC

## 2023-07-21 NOTE — Patient Instructions (Signed)
Medication Instructions:  INCREASE IMDUR( ISOSORBIDE MONITRATE) TO 60 MG DAILY *If you need a refill on your cardiac medications before your next appointment, please call your pharmacy*   Lab Work: BMP, CBC, AND TSH TODAY If you have labs (blood work) drawn today and your tests are completely normal, you will receive your results only by: MyChart Message (if you have MyChart) OR A paper copy in the mail If you have any lab test that is abnormal or we need to change your treatment, we will call you to review the results.   Testing/Procedures:[address] Your physician has requested that you have an ankle brachial index (ABI). During this test an ultrasound and blood pressure cuff are used to evaluate the arteries that supply the arms and legs with blood. Allow thirty minutes for this exam. There are no restrictions or special instructions.  Please note: We ask at that you not bring children with you during ultrasound (echo/ vascular) testing. Due to room size and safety concerns, children are not allowed in the ultrasound rooms during exams. Our front office staff cannot provide observation of children in our lobby area while testing is being conducted. An adult accompanying a patient to their appointment will only be allowed in the ultrasound room at the discretion of the ultrasound technician under special circumstances. We apologize for any inconvenience.    Follow-Up: At [facility], you and your health needs are our priority.  As part of our continuing mission to provide you with exceptional heart care, we have created designated Provider Care Teams.  These Care Teams include your primary Cardiologist (physician) and Advanced Practice Providers (APPs -  Physician Assistants and Nurse Practitioners) who all work together to provide you with the care you need, when you need it.   Your next appointment:   8 week(s)  Provider:   Azalee Course, PA     Other  Instructions Monitor blood pressure and heart rate daily, keep record and bring to next appointment

## 2023-07-21 NOTE — Telephone Encounter (Signed)
LVM an instructed pt to contact central scheduling to schedule scan.

## 2023-07-21 NOTE — Progress Notes (Unsigned)
Cardiology Office Note:  .   Date:  07/21/2023  ID:  Michele [name], DOB 07-31-1955, MRN [id number] PCP: [name], MD  [facility] Providers Cardiologist:  [name], MD { Click to update primary MD,subspecialty MD or APP then REFRESH:1}   History of Present Illness: [name]   Michele [name] is a 68 y.o. female with PMH of CAD, hypertension, hyperlipidemia, COPD, GERD and small cell carcinoma.  She had inferior/lateral STEMI in May 2013 requiring PCI of left circumflex and subsequent staged PCI of the RCA.  By September 2015, bifurcation stenting involving the left circumflex and OM were both severely stenosed, this was managed medically.  Myoview in September 2017 showed EF 50%, overall low risk, no ischemia or infarction.  She was diagnosed with lung cancer in September 2017 and treated with chemo and radiation therapy.  Despite her long-term complaint of persistent palpitation, several event monitor and a Holter monitor did not show significant finding.  She was not placed on beta-blocker due to frequent episode of orthostatic hypotension related syncope.  She has significant anxiety requiring 3 times a day of Xanax.  Echocardiogram in March 2019 showed EF 60 to 65%, no regional wall motion abnormality, mild TR and MR.  Echocardiogram in October 2020 showed EF 65 to 70%, grade 2 DD, trace MR and trivial TR, mild elevated PASP.  Heart monitor in March 2021 showed predominantly sinus rhythm, less than 1% PAC and PVCs, 1 episode of 5 beats run of SVT.  ABI was normal.  Abdominal aorta ultrasound showed no evidence of aneurysm.  Repeat echocardiogram in September 2022 showed EF 65 to 70%, grade 1 DD, mild MR.  She had COVID in July 2022 and was treated with oxygen and remdesivir.  She was seen by cardiology service in January 2024 due to lower extremity edema.  Venous ultrasound was negative for DVT but show evidence of venous insufficiency.  She was instructed to wear compression stocking  and elevate her legs.  She underwent ABI which was normal.  Abdominal ultrasound in January 2024 showed no evidence of AAA, no significant stenosis of the iliac artery.  Echocardiogram in February 2024 showed EF 60 to 65%, grade 1 DD, no regional wall motion abnormality, normal RV.  Myoview in May 2024 was low risk without evidence of ischemia, EF 64%.  Heart monitor shows sinus rhythm, heart rate ranges from 53-1 26, occasional PACs and rare PVCs, no sustained arrhythmia.  She was most recently seen by Ralph Dowdy in December 2024 at which time she was complaining of some left-sided chest discomfort, however this was felt to be musculoskeletal in nature.  Patient complains of bilateral lower extremity pain that is been going on for months.  Left is worse than right.  He she also describe bilateral lower extremity edema which I did not see on exam.  On physical exam, she actually have very good pulse in bilateral lower extremity, my suspicion for vascular issue causing her leg pain is very low.  She does have a herniated disc in her back which may be causing her symptoms.  I will obtain ABI.  She complaining of generalized weakness, I will obtain a basic metabolic panel, CBC and a TSH.  She is also having increased chest discomfort, I will increase her Imdur to 60 mg daily.  If the symptoms still persist on follow-up, I will consider a repeat Myoview.  Patient can follow-up in 8 weeks.  ROS: ***  Studies Reviewed: .        ***  Risk Assessment/Calculations:   {Does this patient have ATRIAL FIBRILLATION?:[id number]}         Physical Exam:   VS:  BP 128/70   Pulse 72   Ht 5\' 5"  (1.651 m)   Wt 134 lb 6.4 oz (61 kg)   SpO2 98%   BMI 22.37 kg/m    Wt Readings from Last 3 Encounters:  07/21/23 134 lb 6.4 oz (61 kg)  05/06/23 132 lb (59.9 kg)  12/16/22 135 lb 6.4 oz (61.4 kg)    GEN: Well nourished, well developed in no acute distress NECK: No JVD; No carotid bruits CARDIAC: ***RRR, no murmurs,  rubs, gallops RESPIRATORY:  Clear to auscultation without rales, wheezing or rhonchi  ABDOMEN: Soft, non-tender, non-distended EXTREMITIES:  No edema; No deformity   ASSESSMENT AND PLAN: .   ***    {Are you ordering a CV Procedure (e.g. stress test, cath, DCCV, TEE, etc)?   Press F2        :[id number]}  Dispo: ***  Signed, Azalee Course, PA

## 2023-07-22 ENCOUNTER — Other Ambulatory Visit: Payer: Self-pay

## 2023-07-22 ENCOUNTER — Emergency Department (HOSPITAL_COMMUNITY): Payer: Medicare HMO

## 2023-07-22 ENCOUNTER — Emergency Department (HOSPITAL_COMMUNITY)
Admission: EM | Admit: 2023-07-22 | Discharge: 2023-07-22 | Disposition: A | Discharge status: Home / Self Care | Payer: Medicare HMO | Attending: Emergency Medicine | Admitting: Emergency Medicine

## 2023-07-22 ENCOUNTER — Encounter (HOSPITAL_COMMUNITY): Payer: Self-pay | Admitting: Emergency Medicine

## 2023-07-22 DIAGNOSIS — N281 Cyst of kidney, acquired: Secondary | ICD-10-CM | POA: Diagnosis not present

## 2023-07-22 DIAGNOSIS — K625 Hemorrhage of anus and rectum: Secondary | ICD-10-CM | POA: Insufficient documentation

## 2023-07-22 DIAGNOSIS — I2510 Atherosclerotic heart disease of native coronary artery without angina pectoris: Secondary | ICD-10-CM | POA: Diagnosis not present

## 2023-07-22 DIAGNOSIS — R1032 Left lower quadrant pain: Secondary | ICD-10-CM | POA: Insufficient documentation

## 2023-07-22 DIAGNOSIS — I10 Essential (primary) hypertension: Secondary | ICD-10-CM | POA: Diagnosis not present

## 2023-07-22 DIAGNOSIS — J439 Emphysema, unspecified: Secondary | ICD-10-CM | POA: Diagnosis not present

## 2023-07-22 DIAGNOSIS — Z7951 Long term (current) use of inhaled steroids: Secondary | ICD-10-CM | POA: Insufficient documentation

## 2023-07-22 DIAGNOSIS — F17200 Nicotine dependence, unspecified, uncomplicated: Secondary | ICD-10-CM | POA: Diagnosis not present

## 2023-07-22 DIAGNOSIS — R0602 Shortness of breath: Secondary | ICD-10-CM | POA: Diagnosis not present

## 2023-07-22 DIAGNOSIS — J449 Chronic obstructive pulmonary disease, unspecified: Secondary | ICD-10-CM | POA: Insufficient documentation

## 2023-07-22 DIAGNOSIS — R109 Unspecified abdominal pain: Secondary | ICD-10-CM | POA: Diagnosis not present

## 2023-07-22 DIAGNOSIS — Z96642 Presence of left artificial hip joint: Secondary | ICD-10-CM | POA: Diagnosis not present

## 2023-07-22 DIAGNOSIS — I700 Atherosclerosis of aorta: Secondary | ICD-10-CM | POA: Diagnosis not present

## 2023-07-22 DIAGNOSIS — R9431 Abnormal electrocardiogram [ECG] [EKG]: Secondary | ICD-10-CM | POA: Diagnosis not present

## 2023-07-22 LAB — COMPREHENSIVE METABOLIC PANEL
ALT: 10 U/L (ref 0–44)
AST: 16 U/L (ref 15–41)
Albumin: 3.8 g/dL (ref 3.5–5.0)
Alkaline Phosphatase: 48 U/L (ref 38–126)
Anion gap: 10 (ref 5–15)
BUN: 16 mg/dL (ref 8–23)
CO2: 23 mmol/L (ref 22–32)
Calcium: 9.3 mg/dL (ref 8.9–10.3)
Chloride: 105 mmol/L (ref 98–111)
Creatinine, Ser: 0.7 mg/dL (ref 0.44–1.00)
GFR, Estimated: >60 mL/min (ref >60)
Glucose, Bld: 108 mg/dL — ABNORMAL HIGH (ref 70–99)
Potassium: 4.1 mmol/L (ref 3.5–5.1)
Sodium: 138 mmol/L (ref 135–145)
Total Bilirubin: 0.4 mg/dL (ref 0.0–1.2)
Total Protein: 6.4 g/dL — ABNORMAL LOW (ref 6.5–8.1)

## 2023-07-22 LAB — CBC
HCT: 41.4 % (ref 36.0–46.0)
Hematocrit: 40.6 % (ref 34.0–46.6)
Hemoglobin: 13.3 g/dL (ref 11.1–15.9)
Hemoglobin: 13.7 g/dL (ref 12.0–15.0)
MCH: 30 pg (ref 26.6–33.0)
MCH: 30.7 pg (ref 26.0–34.0)
MCHC: 32.8 g/dL (ref 31.5–35.7)
MCHC: 33.1 g/dL (ref 30.0–36.0)
MCV: 91 fL (ref 79–97)
MCV: 92.8 fL (ref 80.0–100.0)
Platelets: 206 10*3/uL (ref 150–400)
Platelets: 212 10*3/uL (ref 150–450)
RBC: 4.44 x10E6/uL (ref 3.77–5.28)
RBC: 4.46 MIL/uL (ref 3.87–5.11)
RDW: 13.1 % (ref 11.7–15.4)
RDW: 13.5 % (ref 11.5–15.5)
WBC: 4.3 10*3/uL (ref 3.4–10.8)
WBC: 5.7 10*3/uL (ref 4.0–10.5)
nRBC: 0 % (ref 0.0–0.2)

## 2023-07-22 LAB — BASIC METABOLIC PANEL
BUN/Creatinine Ratio: 27 (ref 12–28)
BUN: 20 mg/dL (ref 8–27)
CO2: 24 mmol/L (ref 20–29)
Calcium: 9.3 mg/dL (ref 8.7–10.3)
Chloride: 105 mmol/L (ref 96–106)
Creatinine, Ser: 0.75 mg/dL (ref 0.57–1.00)
Glucose: 119 mg/dL — ABNORMAL HIGH (ref 70–99)
Potassium: 4.1 mmol/L (ref 3.5–5.2)
Sodium: 141 mmol/L (ref 134–144)
eGFR: 86 mL/min/{1.73_m2} (ref >59)

## 2023-07-22 LAB — TYPE AND SCREEN
ABO/RH(D): B NEG
Antibody Screen: NEGATIVE

## 2023-07-22 LAB — LIPASE, BLOOD: Lipase: 36 U/L (ref 11–51)

## 2023-07-22 LAB — TROPONIN I (HIGH SENSITIVITY)
Troponin I (High Sensitivity): 7 ng/L (ref <18)
Troponin I (High Sensitivity): 5 ng/L (ref <18)

## 2023-07-22 LAB — TSH: TSH: 1.93 u[IU]/mL (ref 0.450–4.500)

## 2023-07-22 MED ORDER — MORPHINE SULFATE (PF) 4 MG/ML IV SOLN
4.0000 mg | Freq: Once | INTRAVENOUS | Status: AC
  Administered 2023-07-22: 4 mg via INTRAVENOUS
  Filled 2023-07-22: qty 1

## 2023-07-22 MED ORDER — IOHEXOL 350 MG/ML SOLN
75.0000 mL | Freq: Once | INTRAVENOUS | Status: AC | PRN
  Administered 2023-07-22: 75 mL via INTRAVENOUS

## 2023-07-22 NOTE — Discharge Instructions (Addendum)
Please call to follow-up with your GI doctor if you continue to have any abdominal pain or rectal bleeding.  Please return to the emergency department if your rectal bleeding worsens or abdominal pain worsens prior to being able to arrange follow up.  Your workup today suggested no evidence of any damage to your heart, lungs, I suspect the pain you are experiencing is coming from your stomach, intestines but there is no evidence of a surgical or infectious abnormality tonight.  The bleeding that you had prior to arrival was resolved on my exam and your hemoglobin is stable, there is no evidence of an ongoing GI bleed on your imaging workup.

## 2023-07-22 NOTE — ED Triage Notes (Signed)
Pt BIB by EMS from home with reports of ABD pain and bright red bleeding. Hx of GI bleed.  EMS VS BP 140/78 HR 70 98% RA CBG 88

## 2023-07-22 NOTE — ED Notes (Signed)
This RN reviewed discharge instructions with patient. She verbalized understanding and denied any further questions. PT well appearing upon discharge and reports tolerable pain. Pwheeled out of ed to ride home with husband.

## 2023-07-22 NOTE — ED Notes (Signed)
RN and tech at bedside with EDP for rectal exam.

## 2023-07-22 NOTE — ED Provider Notes (Signed)
[place] EMERGENCY DEPARTMENT AT [facility] Provider Note   CSN: [id number] Arrival date & time: 07/22/23  0001     History  Chief Complaint  Patient presents with   Rectal Bleeding   Abdominal Pain    Michele [name] is a 68 y.o. female with past medical history significant for tobacco abuse, abdominal pain, STEMI on Plavix, history of abdominal aortic aneurysm without rupture, CAD, COPD who presents with concern for abdominal pain, bright red bleeding from either rectum or vagina, she is not sure.  She reports that she did not have a bowel movements she did not think that it was from her rectum but she does have a history of bleeding hemorrhoids.  She denies any dysuria, urinary frequency.  She reports that she has had rectal bleeding in the past but that is stopped prior to arriving in the hospital.  She reports 7/10 abdominal pain, worse in the left lower quadrant of the abdomen.   Rectal Bleeding Associated symptoms: abdominal pain   Abdominal Pain Associated symptoms: hematochezia        Home Medications Prior to Admission medications   Medication Sig Start Date End Date Taking? Authorizing Provider  acetaminophen (TYLENOL) 325 MG tablet Take 2 tablets (650 mg total) by mouth every 6 (six) hours as needed for mild pain, fever or headache. 12/23/20   Azucena Fallen, MD  ALPRAZolam Prudy Feeler) 1 MG tablet Take 1 mg by mouth as needed.    [provider]  ARNUITY ELLIPTA 100 MCG/ACT AEPB Take 1 puff by mouth daily. 11/15/22   Coralyn Helling, MD  Calcium Citrate-Vitamin D (CITRACAL + D PO) Take 1 tablet by mouth in the morning and at bedtime.     [provider]  clopidogrel (PLAVIX) 75 MG tablet TAKE 1 TABLET BY MOUTH EVERY MONDAY, [facility] AND FRIDAY 07/07/23   Marykay Lex, MD  co-enzyme Q-10 30 MG capsule Take 30 mg by mouth daily.    [provider]  Evolocumab (REPATHA SURECLICK) 140 MG/ML SOAJ Inject 140 mg into the skin every 14  (fourteen) days. 02/17/23   Marykay Lex, MD  fluticasone [facility]) 50 MCG/ACT nasal spray INSTILL 1 SPRAY INTO BOTH NOSTRILS DAILY 09/14/20   Glenford Bayley, NP  guaiFENesin (MUCINEX) 600 MG 12 hr tablet Take 1 tablet (600 mg total) by mouth 2 (two) times daily. 06/29/21   Glenford Bayley, NP  isosorbide mononitrate (IMDUR) 60 MG 24 hr tablet Take 1 tablet (60 mg total) by mouth daily. 07/21/23 10/19/23  Azalee Course, PA  levalbuterol [facility] HFA) 45 MCG/ACT inhaler Inhale 2 puffs into the lungs every 6 (six) hours as needed for wheezing. 07/09/23   Oretha Milch, MD  levalbuterol (XOPENEX) 0.63 MG/3ML nebulizer solution Take 3 mLs (0.63 mg total) by nebulization every 4 (four) hours as needed for wheezing or shortness of breath (dx: R00.2, J44.9). 11/15/22   Coralyn Helling, MD  Multiple Vitamins-Minerals (CENTRUM SILVER ULTRA WOMENS) TABS See admin instructions.    [provider]  nitroGLYCERIN (NITROSTAT) 0.4 MG SL tablet PLACE 1 TABLET UNDER TONGUE EVERY 5 MINUTES AS NEEDED FOR CHEST PAIN 10/10/22   Marykay Lex, MD  pantoprazole (PROTONIX) 40 MG tablet Take 1 tablet (40 mg total) by mouth daily. 11/20/22   Jonita Albee, PA-C  polyethylene glycol (MIRALAX / GLYCOLAX) 17 g packet Take 17 g by mouth daily as needed for mild constipation.    [provider]  pravastatin (PRAVACHOL) 80  MG tablet Take 1 tablet by mouth every Monday, Wednesday, Friday 05/19/23   Marykay Lex, MD  Respiratory Therapy Supplies (FLUTTER) DEVI 1 application by Does not apply route 2 (two) times daily. 05/20/18   Coralyn Helling, MD  RESTASIS 0.05 % ophthalmic emulsion Place 1 drop into both eyes 2 (two) times daily.  02/27/19   [provider]  sodium chloride HYPERTONIC 3 % nebulizer solution Take by nebulization as needed for cough. 03/06/21   Glenford Bayley, NP  SPIRIVA HANDIHALER 18 MCG inhalation capsule Place 1 capsule (18 mcg total) into inhaler and inhale daily. 08/29/22   Coralyn Helling, MD  VASCEPA 1 g capsule TAKE 1 CAPSULE BY MOUTH TWICE DAILY 03/31/23   Jonita Albee, PA-C      Allergies    Ciprofloxacin, Aspirin, Crestor [rosuvastatin], Ibuprofen, Wellbutrin [bupropion], Amitiza [lubiprostone], Penicillin g, Prednisone, Zoloft [sertraline hcl], Albuterol, Lipitor [atorvastatin], and Sulfonamide derivatives    Review of Systems   Review of Systems  Gastrointestinal:  Positive for abdominal pain and hematochezia.  All other systems reviewed and are negative.   Physical Exam Updated Vital Signs BP (!) 145/65   Pulse 65   Temp 98.3 F (36.8 C) (Oral)   Resp 13   SpO2 97%  Physical Exam Vitals and nursing note reviewed.  Constitutional:      General: She is not in acute distress.    Appearance: Normal appearance.  HENT:     Head: Normocephalic and atraumatic.  Eyes:     General:        Right eye: No discharge.        Left eye: No discharge.  Cardiovascular:     Rate and Rhythm: Normal rate and regular rhythm.     Heart sounds: No murmur heard.    No friction rub. No gallop.  Pulmonary:     Effort: Pulmonary effort is normal.     Breath sounds: Normal breath sounds.  Abdominal:     General: Bowel sounds are normal.     Palpations: Abdomen is soft.     Comments: Diffusely tender throughout the abdomen, most focally in the left lower quadrant, no rebound, rigidity, guarding, normal bowel sounds throughout.  Genitourinary:    Comments: Some external and internal hemorrhoids visualized and palpated, no active bleeding from rectum but some dried blood around the rectum. Skin:    General: Skin is warm and dry.     Capillary Refill: Capillary refill takes less than 2 seconds.  Neurological:     Mental Status: She is alert and oriented to person, place, and time.  Psychiatric:        Mood and Affect: Mood normal.        Behavior: Behavior normal.     ED Results / Procedures / Treatments   Labs (all labs ordered are listed, but only  abnormal results are displayed) Labs Reviewed  COMPREHENSIVE METABOLIC PANEL - Abnormal; Notable for the following components:      Result Value   Glucose, Bld 108 (*)    Total Protein 6.4 (*)    All other components within normal limits  CBC  LIPASE, BLOOD  URINALYSIS, ROUTINE W REFLEX MICROSCOPIC  TYPE AND SCREEN  TROPONIN I (HIGH SENSITIVITY)  TROPONIN I (HIGH SENSITIVITY)    EKG EKG Interpretation Date/Time:  Tuesday July 22 2023 00:41:51 EST Ventricular Rate:  62 PR Interval:  166 QRS Duration:  92 QT Interval:  386 QTC Calculation: 392 R Axis:  23  Text Interpretation: Sinus rhythm Abnormal R-wave progression, early transition Confirmed by Tilden Fossa [phone]) on 07/22/2023 12:45:39 AM  Radiology CT ANGIO GI BLEED Result Date: 07/22/2023 CLINICAL DATA:  Discussion disease disease 6 2 abdominal pain, rectal bleeding EXAM: CTA ABDOMEN AND PELVIS WITHOUT AND WITH CONTRAST TECHNIQUE: Multidetector CT imaging of the abdomen and pelvis was performed using the standard protocol during bolus administration of intravenous contrast. Multiplanar reconstructed images and MIPs were obtained and reviewed to evaluate the vascular anatomy. RADIATION DOSE REDUCTION: This exam was performed according to the departmental dose-optimization program which includes automated exposure control, adjustment of the mA and/or kV according to patient size and/or use of iterative reconstruction technique. CONTRAST:  75mL OMNIPAQUE IOHEXOL 350 MG/ML SOLN COMPARISON:  12/20/2020 FINDINGS: VASCULAR Aorta: Aortic atherosclerosis.  No aneurysm or dissection. Celiac: Widely patent SMA: Widely patent Renals: Single renal arteries bilaterally, widely patent IMA: Small caliber, patent Inflow: Atherosclerotic calcifications.  No aneurysm or dissection. Proximal Outflow: Scattered calcifications. No aneurysm or dissection. Veins: No obvious venous abnormality within the limitations of this arterial phase study.  Review of the MIP images confirms the above findings. NON-VASCULAR Lower chest: Small hiatal hernia.  No acute abnormality. Hepatobiliary: No focal hepatic abnormality. Gallbladder unremarkable. Pancreas: No focal abnormality or ductal dilatation. Spleen: No focal abnormality.  Normal size. Adrenals/Urinary Tract: Adrenal glands normal. 2.2 cm cyst in the midpole of the right kidney. No follow-up imaging recommended. No hydronephrosis. Urinary bladder decompressed, unremarkable. Stomach/Bowel: Stomach, large and small bowel grossly unremarkable. No contrast extravasation to localize GI bleed. No bowel obstruction or inflammatory process. Lymphatic: No adenopathy Reproductive: Uterus and adnexa unremarkable.  No mass. Other: No free fluid or free air. Musculoskeletal: No acute bony abnormality. Prior left hip replacement. IMPRESSION: VASCULAR Aortoiliac atherosclerosis.  No aneurysm or dissection. No contrast extravasation visualized to localize GI bleed. NON-VASCULAR Small hiatal hernia. No acute findings in the abdomen or pelvis. Electronically Signed   By: Charlett Nose M.D.   On: 07/22/2023 01:41   DG Chest Portable 1 View Result Date: 07/22/2023 CLINICAL DATA:  Shortness of breath EXAM: PORTABLE CHEST 1 VIEW COMPARISON:  01/30/2023 FINDINGS: Bullous emphysematous changes. Heart and mediastinal contours are within normal limits. No focal opacities or effusions. No acute bony abnormality. IMPRESSION: Emphysema. No active cardiopulmonary disease. Electronically Signed   By: Charlett Nose M.D.   On: 07/22/2023 01:07    Procedures Procedures    Medications Ordered in ED Medications  morphine (PF) 4 MG/ML injection 4 mg (4 mg Intravenous Given 07/22/23 0127)  iohexol (OMNIPAQUE) 350 MG/ML injection 75 mL (75 mLs Intravenous Contrast Given 07/22/23 0124)    ED Course/ Medical Decision Making/ A&P                                 Medical Decision Making Amount and/or Complexity of Data Reviewed Labs:  ordered. Radiology: ordered.   This patient is a 68 y.o. female  who presents to the ED for concern of abdominal pain, rectal bleeding.   Differential diagnoses prior to evaluation: The emergent differential diagnosis includes, but is not limited to,  The causes of generalized abdominal pain include but are not limited to AAA, mesenteric ischemia, appendicitis, diverticulitis, DKA, gastritis, gastroenteritis, AMI, nephrolithiasis, pancreatitis, peritonitis, adrenal insufficiency,lead poisoning, iron toxicity, intestinal ischemia, constipation, UTI,SBO/LBO, splenic rupture, biliary disease, IBD, IBS, PUD, or hepatitis. This is not an exhaustive differential.   Past Medical History / Co-morbidities /  Social History:  tobacco abuse, abdominal pain, STEMI on Plavix, history of abdominal aortic aneurysm without rupture, CAD, COPD  Additional history: Chart reviewed. Pertinent results include: Reviewed outpatient cardiology visits, gastroenterology visits including previous imaging of the colon  Physical Exam: Physical exam performed. The pertinent findings include: Some tenderness to palpation in the left lower quadrant, normal appearance of external rectum other than some visualized hemorrhoids and some dried blood.  No dark red blood or bright red blood in rectal vault.  Lab Tests/Imaging studies: I personally interpreted labs/imaging and the pertinent results include: CBC unremarkable, notably with no anemia, no leukocytosis.  CMP overall unremarkable.  Initial troponin normal, delta troponin flat.  I independently interpreted CT angio GI bleed and plain film chest x-ray which showed no evidence of acute intrathoracic or intra-abdominal abnormality, no active GI bleed.. I agree with the radiologist interpretation.  Cardiac monitoring: EKG obtained and interpreted by myself and attending physician which shows: Normal sinus rhythm, no acute ST-T changes   Medications: I ordered medication  including Morphine for pain I have reviewed the patients home medicines and have made adjustments as needed.   Disposition: After consideration of the diagnostic results and the patients response to treatment, I feel that patient pain improved on reassessment, no acute cause for her abdominal pain or rectal bleeding noted in the emergency department, hemoglobin stable, no evidence of active bleeding on exam.  Feel that patient is stable for close GI follow-up, no additional treatment recommended at this time.   emergency department workup does not suggest an emergent condition requiring admission or immediate intervention beyond what has been performed at this time. The plan is: as above. The patient is safe for discharge and has been instructed to return immediately for worsening symptoms, change in symptoms or any other concerns.  Final Clinical Impression(s) / ED Diagnoses Final diagnoses:  Left lower quadrant abdominal pain  Rectal bleeding    Rx / DC Orders ED Discharge Orders     None         [place] 07/22/23 0343    Tilden Fossa, MD 07/22/23 [phone]

## 2023-07-24 ENCOUNTER — Telehealth: Payer: Self-pay | Admitting: Physician Assistant

## 2023-07-24 NOTE — Telephone Encounter (Signed)
I have called the patient, all questions answered

## 2023-07-24 NOTE — Telephone Encounter (Addendum)
Spoke to patient . Patient states she went home  and thought about the  increase Isosorbide from 60 mg to 30 mg.  She would like  to stay with the 30 mg tablet. She states she was informed by pharmacist and  Lisabeth Devoid PA  that this medication may drop her blood pressure and  she states she do not want to have any accidents by falling. She states she gets up a lot the go an urinate as it is.   Patient states she has not picked up the 60 mg dose and has not increased the medication. She has not been recording  blood pressure as well.   RN informed  patient to at least try taking medication , but she states she is scared of falling. Informed patient the medication is  for her increased chest discomfort she mention to the provider.  She states she rather stay with the medication - 30 mg      Patient aware will defer to provider

## 2023-07-24 NOTE — Telephone Encounter (Signed)
Attempted to call patient without success, will try again in a hour

## 2023-07-24 NOTE — Telephone Encounter (Signed)
Pt c/o medication issue:  1. Name of Medication: isosorbide mononitrate (IMDUR) 60 MG 24 hr tablet   2. How are you currently taking this medication (dosage and times per day)?   3. Are you having a reaction (difficulty breathing--STAT)?   4. What is your medication issue? Patient states at her last visit it was increased from 30mg  to 60mg , she would like it decrease back to 30mg .  She is worried her BP will drop and she will fall.

## 2023-07-25 NOTE — Telephone Encounter (Signed)
Pt calling back stating that she spoke with someone regarding switching back to isosorbide  30mg  and has not heard anything back. Requesting cb

## 2023-07-25 NOTE — Telephone Encounter (Signed)
 Attempted to call patient, no answer left message requesting a call back.

## 2023-07-28 NOTE — Telephone Encounter (Signed)
 2nd attempt to call patient, no answer left message requesting a call back.

## 2023-07-29 MED ORDER — ISOSORBIDE MONONITRATE ER 30 MG PO TB24: 30.0000 mg | ORAL_TABLET | Freq: Every day | ORAL | 3 refills | Status: DC

## 2023-07-29 NOTE — Telephone Encounter (Signed)
 Ok to keep it at 30mg  daily

## 2023-07-29 NOTE — Telephone Encounter (Signed)
 Patient identification verified by 2 forms. Marilynn Rail, RN    Called and spoke to patient  Relayed provider message below  Informed patient New Rx sent to pharmacy  Patient states:   -has procedure scheduled for 3/19 with gynecology   -would like to know if this is approved  Advised patient:   -have Gynecology office send pre-op request form to office   -fax to [phone] Attn Pre-op  Patient verbalized understanding, no questions at this time

## 2023-07-29 NOTE — Telephone Encounter (Signed)
 Patient identification verified by 2 forms. Marilynn Rail, RN    Called and spoke to patient  Patient states:   -she is taking Isosorbide 30mg    -she does not wish to increase to the 60mg    -she is concerned about having a fall   -last night she almost fell while walking to the bathroom   -prior to almost falling she did not have any lightheadedness or dizziness   -she feels fine with the 30 mg, no symptoms or concerns   -needs assistance with repatha refill  Informed patient:   -message sent to provider regarding imdur   -RN will outreach pharmacy  Patient requests detailed voicemail if she does not answer    RN called pharmacy:   -Patient received a full box of Repatha Rx on 2/4   -she is not technically due for a refill but insurance is willing to pay   -Currently not in stock, should be available on 2/27    Attempted to call patient  No answer left detailed message stating that Repatha not in stock, can contact pharmacy on 2/27 for refill request

## 2023-07-31 ENCOUNTER — Ambulatory Visit (INDEPENDENT_AMBULATORY_CARE_PROVIDER_SITE_OTHER): Payer: Medicare HMO | Admitting: Nurse Practitioner

## 2023-07-31 ENCOUNTER — Encounter: Payer: Self-pay | Admitting: Nurse Practitioner

## 2023-07-31 ENCOUNTER — Telehealth: Payer: Self-pay | Admitting: Nurse Practitioner

## 2023-07-31 VITALS — BP 122/68 | HR 69 | Temp 97.8°F

## 2023-07-31 DIAGNOSIS — C3491 Malignant neoplasm of unspecified part of right bronchus or lung: Secondary | ICD-10-CM | POA: Diagnosis not present

## 2023-07-31 DIAGNOSIS — J441 Chronic obstructive pulmonary disease with (acute) exacerbation: Secondary | ICD-10-CM | POA: Diagnosis not present

## 2023-07-31 DIAGNOSIS — I5032 Chronic diastolic (congestive) heart failure: Secondary | ICD-10-CM

## 2023-07-31 DIAGNOSIS — J471 Bronchiectasis with (acute) exacerbation: Secondary | ICD-10-CM

## 2023-07-31 DIAGNOSIS — C3490 Malignant neoplasm of unspecified part of unspecified bronchus or lung: Secondary | ICD-10-CM

## 2023-07-31 MED ORDER — AZITHROMYCIN 250 MG PO TABS: ORAL_TABLET | ORAL | 0 refills | Status: DC

## 2023-07-31 MED ORDER — PREDNISONE 10 MG PO TABS: ORAL_TABLET | ORAL | 0 refills | Status: DC

## 2023-07-31 NOTE — Assessment & Plan Note (Signed)
 Euvolemic on exam. No associated cardiac symptoms. Follow up with cardiology as scheduled.

## 2023-07-31 NOTE — Patient Instructions (Addendum)
 Continue levalbuterol inhaler 2 puffs or 3 mL neb every 6 hours as needed for shortness of breath or wheezing. Notify if symptoms persist despite rescue inhaler/neb use. Follow nebs with flutter valve 10 times 2-3 times a day Continue arnuity 1 puff daily. Brush tongue and rinse mouth afterwards Continue spiriva 2 puffs daily Continue flonase nasal spray 2 sprays each nostril daily  Restart mucinex or mucinex DM Twice daily for cough/congestion. If cough is not bothersome, you can just use the regular mucinex Use hypertonic saline nebs Twice daily to help clear mucus   Prednisone 20 mg daily for 5 days then 10 mg daily for 5 days. Take in AM with food. Azithromycin - take 2 tabs daily then 1 tab daily for four additional days. Take with food   We will move up your CT chest scan - someone should contact you to schedule within the week I'll coordinate with Dr. Arbutus Ped after this  Follow up in 2 weeks with Dr. Delton Coombes (new pt 30 min slot) or [name] Yeni Jiggetts,NP. If symptoms do not improve or worsen, please contact office for sooner follow up or seek emergency care.

## 2023-07-31 NOTE — Telephone Encounter (Signed)
 Patient is wondering what he following means on her AVS    "Use hypertonic saline nebs Twice daily to help clear mucus "  She does not have hypertonic saline nebs   Please call and advise [phone]

## 2023-07-31 NOTE — Assessment & Plan Note (Signed)
 See above. Recommend sputum cultures if symptoms persist.

## 2023-07-31 NOTE — Assessment & Plan Note (Signed)
 Hx of small cell s/p concurrent chemoradiation, completed 2017 and on surveillance. Intermittent hemoptysis concerning for recurrence. Will move up upcoming CT chest to within next week. Plan to coordinate with oncology after.
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
## 2023-07-31 NOTE — Assessment & Plan Note (Addendum)
 COPD with increased productive cough and intermittent hemoptysis over past 4-5 months. Non-toxic. Unlikely PE given timeline; no s/s of DVT, hypoxia or tachycardia. Symptoms insidious and do not seem to be acute; however, given progression and hx, will treat her with empiric z pack and prednisone taper and reassess response. Recent CXR without acute process. Has had difficulties with LABA therapy in the past so currently on ICS/LAMA. Could consider addition of Ohtuvayre if she continues to have daily productive cough. Action plan in place. Smoking cessation advised. Cough control/mucociliary clearance therapies.   Will complete surgical risk assessment once she returns if symptoms improved.  Patient Instructions  Continue levalbuterol inhaler 2 puffs or 3 mL neb every 6 hours as needed for shortness of breath or wheezing. Notify if symptoms persist despite rescue inhaler/neb use. Follow nebs with flutter valve 10 times 2-3 times a day Continue arnuity 1 puff daily. Brush tongue and rinse mouth afterwards Continue spiriva 2 puffs daily Continue flonase nasal spray 2 sprays each nostril daily  Restart mucinex or mucinex DM Twice daily for cough/congestion. If cough is not bothersome, you can just use the regular mucinex Use hypertonic saline nebs Twice daily to help clear mucus   Prednisone 20 mg daily for 5 days then 10 mg daily for 5 days. Take in AM with food. Azithromycin - take 2 tabs daily then 1 tab daily for four additional days. Take with food   We will move up your CT chest scan - someone should contact you to schedule within the week I'll coordinate with Dr. Arbutus Ped after this  Follow up in 2 weeks with Dr. Delton Coombes (new pt 30 min slot) or [name] Chantelle Verdi,NP. If symptoms do not improve or worsen, please contact office for sooner follow up or seek emergency care.

## 2023-08-01 MED ORDER — SODIUM CHLORIDE 3 % IN NEBU: INHALATION_SOLUTION | RESPIRATORY_TRACT | 12 refills | Status: DC | PRN

## 2023-08-01 NOTE — Telephone Encounter (Signed)
 Called and spoke with patient, she states she does not have the hypertonic saline solution and needs a nebulizer machine.  She said she thinks she was not given enough prednisone tablets.  Patient counted her tablets and she had the correct amount of tablets.  Order placed for the hypertonic solution and the nebulizer machine.  Clarified with patient that she can take the prednisone and Azithromycin together and she should take the prednisone with food.  She verbalized understanding.  Verified pharmacy and script sent.

## 2023-08-01 NOTE — Telephone Encounter (Addendum)
 Left message for patient to call clinic

## 2023-08-04 ENCOUNTER — Telehealth: Payer: Self-pay | Admitting: Student

## 2023-08-04 IMAGING — MR MR HEAD WO/W CM
13 series · 48 of 48 positions shown · IV contrast (gadavist)
Comparison: April 2020

CLINICAL DATA: Brain/CNS neoplasm, assess treatment response; lung
cancer with prior history of prophylactic whole brain radiation

EXAM:
MRI HEAD WITHOUT AND WITH CONTRAST
TECHNIQUE: Multiplanar, multiecho pulse sequences of the brain and surrounding
structures were obtained without and with intravenous contrast.
CONTRAST:  6mL GADAVIST GADOBUTROL 1 MMOL/ML IV SOLN

[Series 5: DWI · axial · 3.0mm · 1.36mm/px · z∈[-76,+62]mm · 5 of 96 slices shown (1 of 2)]
[im 1/96]
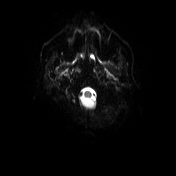
[im 24/96]
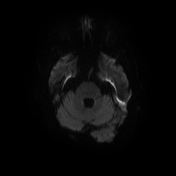
[im 48/96]
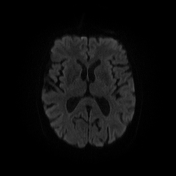
[im 72/96]
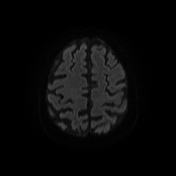
[im 96/96]
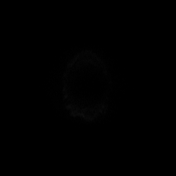

[Series 6: DWI · axial · 3.0mm · 1.36mm/px · z∈[-76,+62]mm · 2 of 48 slices shown (2 of 2)]
[im 1/48]
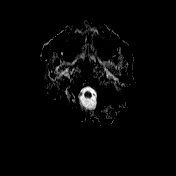
[im 48/48]
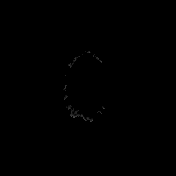

[Series 7: T1 · sagittal · 5.0mm · 0.75mm/px · 1 of 24 slices shown (1 of 2)]
[im 1/24]
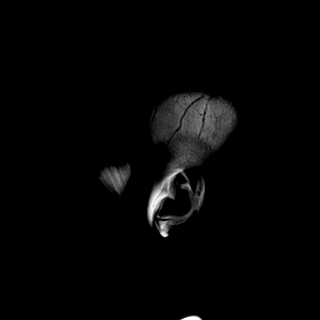

[Series 8: T2 · axial · 5.0mm · 0.62mm/px · z∈[-78,+68]mm · 2 of 24 slices shown]
[im 1/24]
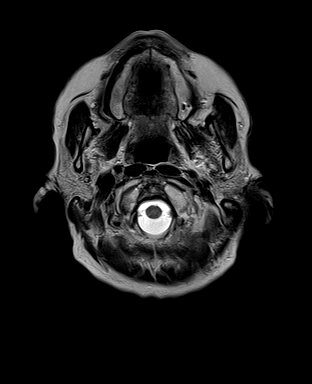
[im 24/24]
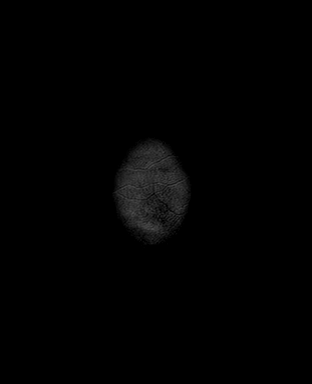

[Series 9: swi_images · axial · 3.0mm · 0.75mm/px · z∈[-110,+99]mm · 5 of 72 slices shown]
[im 1/72]
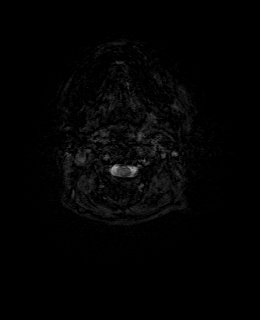
[im 18/72]
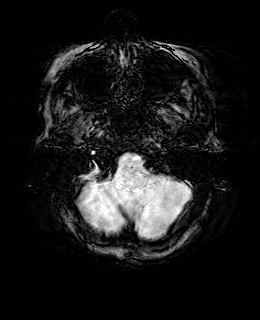
[im 36/72]
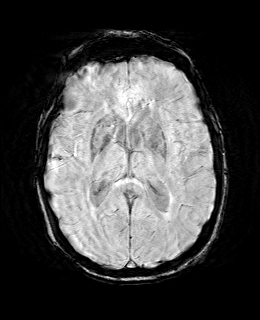
[im 54/72]
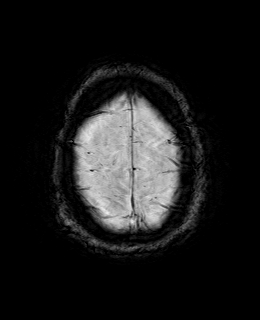
[im 72/72]
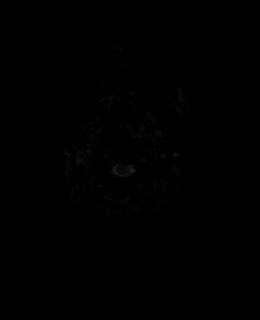

[Series 11: FLAIR · axial · 3.0mm · 0.75mm/px · z∈[-80,+70]mm · 3 of 52 slices shown]
[im 1/52]
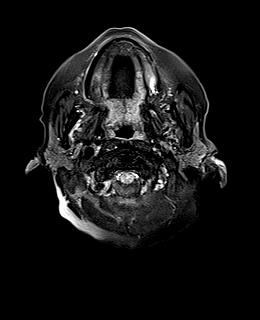
[im 26/52]
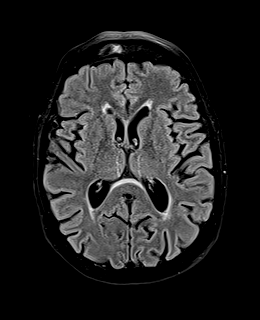
[im 52/52]
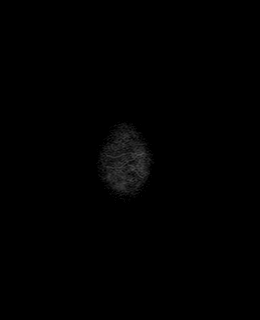

[Series 12: T1 · axial · 1.0mm · 0.94mm/px · z∈[-75,+66]mm · 9 of 144 slices shown (2 of 2)]
[im 1/144]
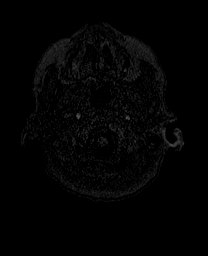
[im 18/144]
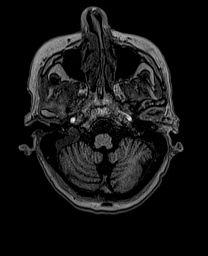
[im 36/144]
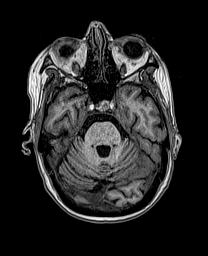
[im 54/144]
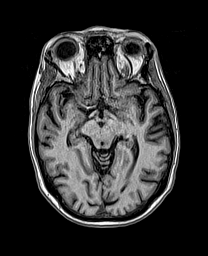
[im 72/144]
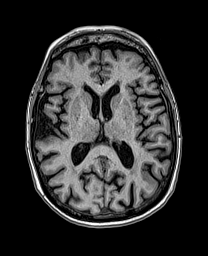
[im 90/144]
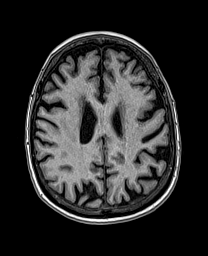
[im 108/144]
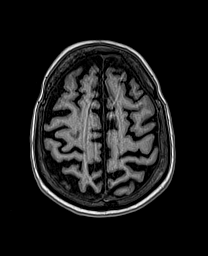
[im 126/144]
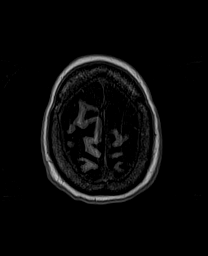
[im 144/144]
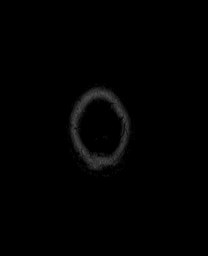

[Series 13: cor dwi_tracew · coronal · 5.0mm · 1.53mm/px · 4 of 56 slices shown]
[im 1/56]
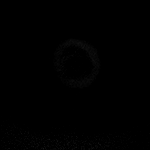
[im 19/56]
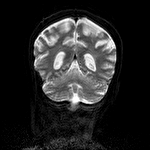
[im 37/56]
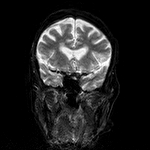
[im 56/56]
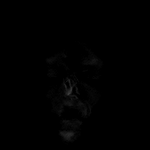

[Series 14: cor dwi_adc · coronal · 5.0mm · 1.53mm/px · 2 of 25 slices shown]
[im 1/25]
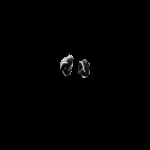
[im 25/25]
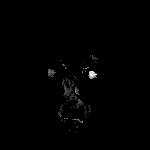

[Series 19: T2 post-contrast · coronal · 5.0mm · 0.57mm/px · 2 of 28 slices shown]
[im 1/28]
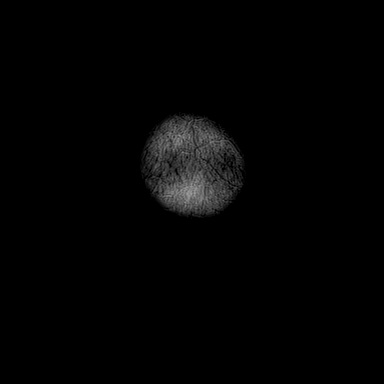
[im 28/28]
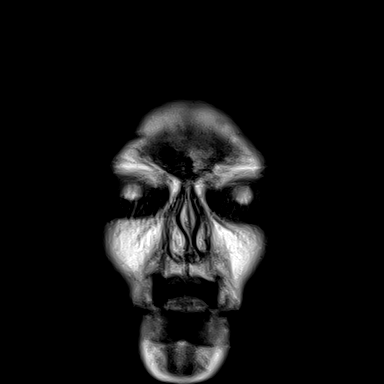

[Series 20: T1 post-contrast · axial · 1.0mm · 0.94mm/px · z∈[-15,+125]mm · 9 of 144 slices shown (1 of 3)]
[im 1/144]
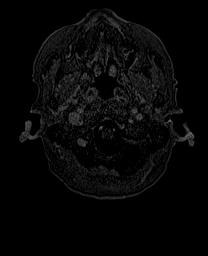
[im 18/144]
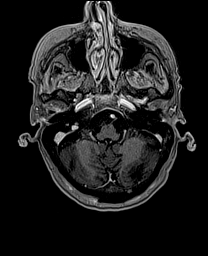
[im 36/144]
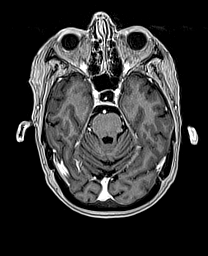
[im 54/144]
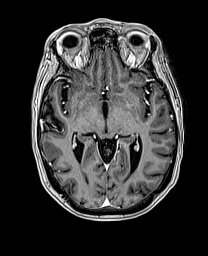
[im 72/144]
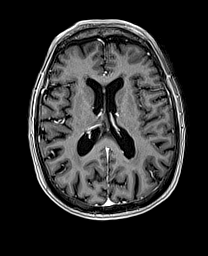
[im 90/144]
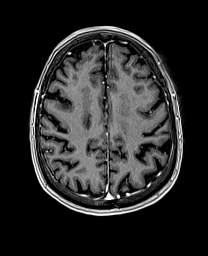
[im 108/144]
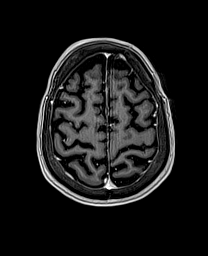
[im 126/144]
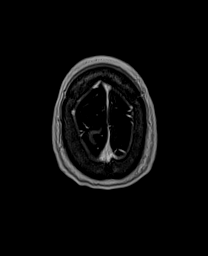
[im 144/144]
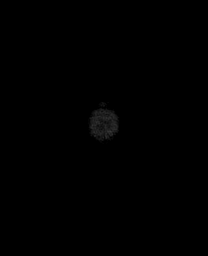

[Series 21: T1 post-contrast · coronal · 5.0mm · 0.43mm/px · 2 of 28 slices shown (2 of 3)]
[im 1/28]
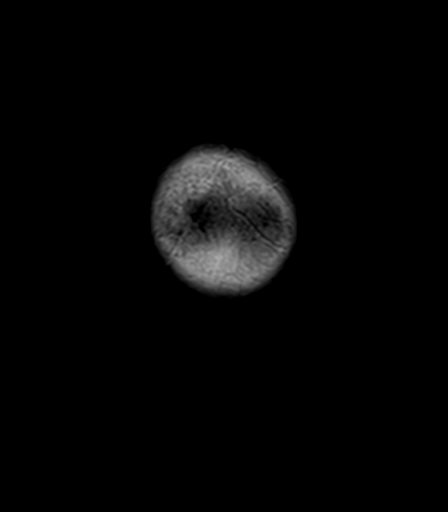
[im 28/28]
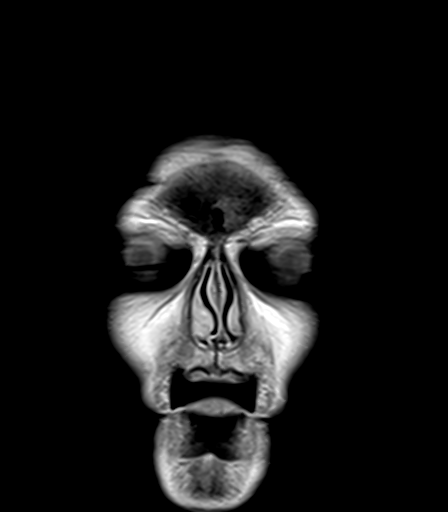

[Series 22: T1 post-contrast · sagittal · 5.0mm · 0.75mm/px · 2 of 24 slices shown (3 of 3)]
[im 1/24]
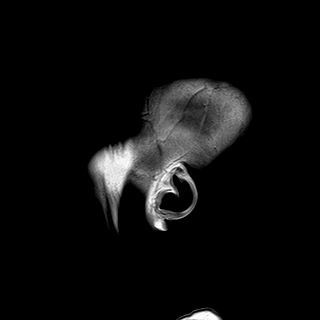
[im 24/24]
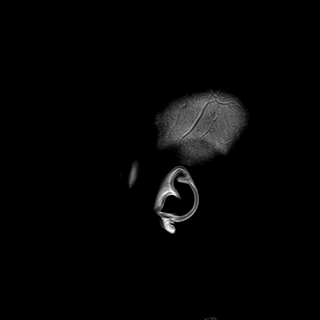

[48 of 48 positions shown; findings below may reference images not displayed]

FINDINGS: Brain: There is no acute infarction or intracranial hemorrhage.
There is no intracranial mass, mass effect, or edema. There is no
hydrocephalus or extra-axial fluid collection. Ventricles and sulci
are stable in size and configuration. Patchy T2 hyperintensity in
the supratentorial white matter is nonspecific and may reflect
chronic microvascular ischemic and/or treatment related changes. A
punctate focus of left parietal susceptibility is again identified
and likely reflects chronic microhemorrhage. No abnormal
enhancement.

Vascular: Major vessel flow voids at the skull base are preserved.

Skull and upper cervical spine: Normal marrow signal is preserved.

Sinuses/Orbits: Paranasal sinuses are aerated. Orbits are
unremarkable.

Other: Sella is partially empty.  Mastoid air cells are clear.
IMPRESSION: No evidence of intracranial metastatic disease.

## 2023-08-04 NOTE — Telephone Encounter (Signed)
 Lmam for patient to call [facility] to set this up there number is [phone]

## 2023-08-04 NOTE — Telephone Encounter (Signed)
 PT ret call from someone setting up a CT appt for PT. Please try again. Her # is [phone]

## 2023-08-05 ENCOUNTER — Telehealth: Payer: Self-pay | Admitting: Nurse Practitioner

## 2023-08-05 ENCOUNTER — Telehealth: Payer: Self-pay | Admitting: Primary Care

## 2023-08-05 NOTE — Telephone Encounter (Signed)
 sodium chloride HYPERTONIC 3 % nebulizer solution [[id number]]   Instructions say take as needed. [facility] needs to know how many times a day. Please call pharm to clarify.

## 2023-08-05 NOTE — Telephone Encounter (Signed)
 Attempted to call [facility] at [facility].  Closed for lunch until 2:00pm.  Will call back.  Per Chart note from 07/31/2023 under Patient Instructions:  Use hypertonic saline nebs Twice daily to help clear mucus   Will relay this information to pharmacy.

## 2023-08-05 NOTE — Telephone Encounter (Signed)
 Patient states currently taking Prednisone. States having symptoms of headaches and bones hurting. Patient phone number is [phone].

## 2023-08-05 NOTE — Telephone Encounter (Signed)
 I called and spoke with pt. Pt states she is having muscle pain and bad headaches from Prednisone. Pt states this has went on since February 28th. Pt states she has still ben taking this but now that she is taking it once daily, the pain is not as bad. Pt states her bp has went up and she seems irritable. Pt has 5 more days left. Please advise.   Pt also needs to be called back to schedule CT. Pt hung up the first time.

## 2023-08-05 NOTE — Telephone Encounter (Signed)
 Called pharmacy, [facility].  Gave information regarding hypertonic saline nebs.   Use hypertonic saline nebs Twice daily to help clear mucus  Disp # one box Refill x 1 per previous refill.

## 2023-08-05 NOTE — Telephone Encounter (Signed)
 If symptoms have subsided, have her complete prednisone taper as prescribed. Irritability is normal with prednisone. Thanks

## 2023-08-05 NOTE — Telephone Encounter (Signed)
 I called and spoke with pt. Pt informed of [name]'s note. Pt verbalized understanding. I informed pt I would have someone call her to have the CT scheduled. NFN. Routing to [facility].

## 2023-08-06 NOTE — Telephone Encounter (Signed)
 Michele [name] will you please schedule this CT?  Thanks!

## 2023-08-07 NOTE — Telephone Encounter (Signed)
 Pt has been scheduled for CT and left detailed VM. NFN

## 2023-08-08 ENCOUNTER — Ambulatory Visit: Payer: Self-pay | Admitting: Nurse Practitioner
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
## 2023-08-08 NOTE — Telephone Encounter (Signed)
**Note De-identified  [place] Obfuscation** Please advise 

## 2023-08-08 NOTE — Telephone Encounter (Signed)
 Swelling in lower legs not uncommon with prednisone. Can cause some fluid retention. Elevate feet and use compression stockings. Monitor salt intake.  Abx sometimes can cause loose stools. Use OTC probiotic for next few days to help restore natural bacteria in gut. Follow up with PCP if urinary symptoms or GI symptoms persist. Thanks.

## 2023-08-11 ENCOUNTER — Telehealth: Payer: Self-pay | Admitting: Cardiology

## 2023-08-11 MED ORDER — VASCEPA 1 G PO CAPS: 1.0000 g | ORAL_CAPSULE | Freq: Two times a day (BID) | ORAL | 3 refills | Status: AC

## 2023-08-11 NOTE — Telephone Encounter (Signed)
*  STAT* If patient is at the pharmacy, call can be transferred to refill team.   1. Which medications need to be refilled? (please list name of each medication and dose if known) VASCEPA 1 g capsule   2. Which pharmacy/location (including street and city if local pharmacy) is medication to be sent to? [id number] [place], [place] - [address] AT [facility] [phone]   3. Do they need a 30 day or 90 day supply? 90

## 2023-08-11 NOTE — Telephone Encounter (Signed)
 [name] medication was sent to pt's pharmacy as requested. Confirmation received.

## 2023-08-14 ENCOUNTER — Ambulatory Visit (HOSPITAL_COMMUNITY)
Admission: RE | Admit: 2023-08-14 | Discharge: 2023-08-14 | Disposition: A | Discharge status: Home / Self Care | Payer: Medicare HMO | Source: Ambulatory Visit | Attending: Cardiology | Admitting: Cardiology

## 2023-08-14 ENCOUNTER — Ambulatory Visit: Payer: Medicare HMO | Admitting: Nurse Practitioner

## 2023-08-14 DIAGNOSIS — M79604 Pain in right leg: Secondary | ICD-10-CM | POA: Diagnosis not present

## 2023-08-14 DIAGNOSIS — M79605 Pain in left leg: Secondary | ICD-10-CM | POA: Diagnosis not present

## 2023-08-15 ENCOUNTER — Ambulatory Visit (HOSPITAL_COMMUNITY)
Admission: RE | Admit: 2023-08-15 | Discharge: 2023-08-15 | Disposition: A | Discharge status: Home / Self Care | Source: Ambulatory Visit | Attending: Nurse Practitioner | Admitting: Nurse Practitioner

## 2023-08-15 DIAGNOSIS — J439 Emphysema, unspecified: Secondary | ICD-10-CM | POA: Diagnosis not present

## 2023-08-15 DIAGNOSIS — R042 Hemoptysis: Secondary | ICD-10-CM | POA: Diagnosis not present

## 2023-08-15 DIAGNOSIS — I700 Atherosclerosis of aorta: Secondary | ICD-10-CM | POA: Diagnosis not present

## 2023-08-15 DIAGNOSIS — C3490 Malignant neoplasm of unspecified part of unspecified bronchus or lung: Secondary | ICD-10-CM | POA: Insufficient documentation

## 2023-08-15 LAB — VAS US ABI WITH/WO TBI
Left ABI: 1.08
Right ABI: 1.05

## 2023-08-15 MED ORDER — IOHEXOL 300 MG/ML  SOLN
75.0000 mL | Freq: Once | INTRAMUSCULAR | Status: AC | PRN
  Administered 2023-08-15: 75 mL via INTRAVENOUS

## 2023-08-19 ENCOUNTER — Telehealth: Payer: Self-pay | Admitting: Nurse Practitioner

## 2023-08-19 NOTE — Telephone Encounter (Signed)
 Patient would like a call once CT Scan results are available [phone]

## 2023-08-19 NOTE — Telephone Encounter (Signed)
 Spoke with Michele [name]. She would like to know results from her recent ct scan. I informed pt scans are taking 2-3wks to be read. Spoke with reading room and they are going to trying have scan read asap. Pt would like to know results once available. Pt verbalized understanding & had no concerns. Please advise [name].

## 2023-08-19 NOTE — Telephone Encounter (Signed)
 Will have to wait on CT results. I do not have them back yet.

## 2023-08-20 ENCOUNTER — Telehealth: Payer: Self-pay | Admitting: Nurse Practitioner

## 2023-08-20 ENCOUNTER — Encounter (HOSPITAL_BASED_OUTPATIENT_CLINIC_OR_DEPARTMENT_OTHER): Payer: Self-pay

## 2023-08-20 ENCOUNTER — Ambulatory Visit (HOSPITAL_COMMUNITY): Admit: 2023-08-20 | Payer: Medicare HMO | Admitting: Obstetrics and Gynecology

## 2023-08-20 SURGERY — DILATATION & CURETTAGE/HYSTEROSCOPY WITH MYOSURE
Anesthesia: Choice

## 2023-08-20 MED ORDER — LEVALBUTEROL TARTRATE 45 MCG/ACT IN AERO
2.0000 | INHALATION_SPRAY | Freq: Four times a day (QID) | RESPIRATORY_TRACT | 12 refills | Status: AC | PRN
Start: 2023-08-20 — End: ?

## 2023-08-20 MED ORDER — ARNUITY ELLIPTA 100 MCG/ACT IN AEPB: 1.0000 | INHALATION_SPRAY | Freq: Every day | RESPIRATORY_TRACT | 5 refills | Status: DC

## 2023-08-20 NOTE — Telephone Encounter (Signed)
 Ct not been read yet, waiting on results. Nothing further needed at this time.

## 2023-08-20 NOTE — Telephone Encounter (Signed)
 Patient needs a refill of levalbuterol and arnuity   [facility] on [address]

## 2023-08-21 ENCOUNTER — Telehealth: Payer: Self-pay | Admitting: Internal Medicine

## 2023-08-21 ENCOUNTER — Encounter: Payer: Self-pay | Admitting: *Deleted

## 2023-08-21 ENCOUNTER — Telehealth: Payer: Self-pay | Admitting: Medical Oncology

## 2023-08-21 ENCOUNTER — Telehealth: Payer: Self-pay | Admitting: Nurse Practitioner

## 2023-08-21 DIAGNOSIS — K449 Diaphragmatic hernia without obstruction or gangrene: Secondary | ICD-10-CM

## 2023-08-21 NOTE — Progress Notes (Signed)
 Spoke with Michele [name] and notified of results per [facility]. Michele [name] verbalized understanding. She states needs referral to GI. She seen Dr Christella Hartigan years ago and did not want to see him again. Can I place referral? Thanks!

## 2023-08-21 NOTE — Telephone Encounter (Signed)
 Returned pt call to tell her she does not need CT scan in April. No answer . Not able to lvm.

## 2023-08-21 NOTE — Telephone Encounter (Signed)
 Referral for GI placed  Mychart msg to pt to let her know

## 2023-08-21 NOTE — Telephone Encounter (Signed)
 Patient sees a post in MyChart about her CT scan but would like a call back for clarification. She can be reached at [phone] or [phone]

## 2023-08-21 NOTE — Progress Notes (Signed)
Yes, [place] to place referral

## 2023-08-21 NOTE — Telephone Encounter (Signed)
 I think Verlon Au has already spoken with her. CT did not find any evidence of recurrence of lung cancer and all her lymph nodes are stable from prior exam. GI referral ok. See CT chest note.

## 2023-08-21 NOTE — Telephone Encounter (Signed)
 I called and spoe to pt. Pt states she was told through Mychart when the CT results came, that she has lung cancer. I informed pt that the provider, Michele Croft NP did not find any cancer recurrence. Pt states she was told she does and became confused. I informed the pt that the provider has to review the CT first, and the results went into Mychart before Michele [name] could review this.  Pt also stated she does not have a GI doctor and no longer wanted to see DR Christella Hartigan. I informed pt that we could route this to the provider for GI referral. Pt does not have a preference on who to see. Pt verbalized understanding. Please advise.

## 2023-08-22 ENCOUNTER — Telehealth: Payer: Self-pay | Admitting: Cardiology

## 2023-08-22 MED ORDER — ISOSORBIDE MONONITRATE ER 30 MG PO TB24: 60.0000 mg | ORAL_TABLET | Freq: Every day | ORAL | 3 refills | Status: AC

## 2023-08-22 MED ORDER — ISOSORBIDE MONONITRATE ER 30 MG PO TB24: 30.0000 mg | ORAL_TABLET | Freq: Every day | ORAL | 3 refills | Status: DC

## 2023-08-22 NOTE — Telephone Encounter (Signed)
 Left message for pt to call.

## 2023-08-22 NOTE — Telephone Encounter (Signed)
 Patient is returning call.

## 2023-08-22 NOTE — Telephone Encounter (Signed)
 Pt c/o medication issue:  1. Name of Medication:  isosorbide mononitrate (IMDUR) 30 MG 24 hr tablet   2. How are you currently taking this medication (dosage and times per day)?    3. Are you having a reaction (difficulty breathing--STAT)?    4. What is your medication issue?   Please clarify if medication was supposed to be increased from 30 to 60 MG.

## 2023-08-22 NOTE — Telephone Encounter (Signed)
 Spoke with pt, aware dose of isosorbide is 60 mg once daily per 07/21/23 office note. She reports having trouble swallowing the 60 mg tablets. Script for 30 mg 2 tablets once daily sent into the pharmacy.

## 2023-08-25 ENCOUNTER — Telehealth: Payer: Self-pay | Admitting: Nurse Practitioner

## 2023-08-25 ENCOUNTER — Encounter: Payer: Self-pay | Admitting: Cardiology

## 2023-08-25 NOTE — Telephone Encounter (Signed)
 Error

## 2023-08-25 NOTE — Telephone Encounter (Signed)
 Patient would like to know if we denied her having DNC procedure. Patient phone number is [phone].

## 2023-08-27 NOTE — Telephone Encounter (Signed)
 She was supposed to have f/u in 2 weeks to ensure she had improved. Is she feeling back to her normal?

## 2023-08-27 NOTE — Telephone Encounter (Signed)
 Patient was seen for surgical clearance. She was told to follow up in 2 weeks but it doesn't look like she did. She is asking now if she is clear. How would you like to proceed?

## 2023-09-01 ENCOUNTER — Telehealth: Payer: Self-pay

## 2023-09-01 ENCOUNTER — Telehealth: Payer: Self-pay | Admitting: Cardiology

## 2023-09-01 ENCOUNTER — Other Ambulatory Visit (HOSPITAL_COMMUNITY): Payer: Self-pay

## 2023-09-01 NOTE — Telephone Encounter (Signed)
 Pharmacy Patient Advocate Encounter   Received notification from Physician's Office that prior authorization for REPATHA is required/requested.   Insurance verification completed.   The patient is insured through [facility] .   Per test claim: PA required; PA submitted to above mentioned insurance via CoverMyMeds Key/confirmation #/EOC [id number] Status is pending

## 2023-09-01 NOTE — Telephone Encounter (Signed)
 PA request has been Submitted. New Encounter has been or will be created for follow up. For additional info see Pharmacy Prior Auth telephone encounter from 09/01/23.

## 2023-09-01 NOTE — Telephone Encounter (Signed)
 Received letter about her Repatha, says that she needs a Prior Authorization.   In the letter it says that the insurance co needs the reason she is taking this and a prior auth.  Informed her that we will end this information to our prior authorization department and be back in touch soon with further information- 1-2 days. She verbalized understanding.

## 2023-09-01 NOTE — Telephone Encounter (Signed)
 Pt c/o medication issue:  1. Name of Medication: Evolocumab (REPATHA SURECLICK) 140 MG/ML SOAJ   2. How are you currently taking this medication (dosage and times per day)? 140 mg, Every 14 days   3. Are you having a reaction (difficulty breathing--STAT)? No  4. What is your medication issue? Pt rec'vd letter in mail regarding med, requesting cb

## 2023-09-02 ENCOUNTER — Emergency Department (HOSPITAL_COMMUNITY)
Admission: EM | Admit: 2023-09-02 | Discharge: 2023-09-02 | Disposition: A | Discharge status: Home / Self Care | Attending: Emergency Medicine | Admitting: Emergency Medicine

## 2023-09-02 ENCOUNTER — Other Ambulatory Visit: Payer: Self-pay

## 2023-09-02 DIAGNOSIS — T2027XA Burn of second degree of neck, initial encounter: Secondary | ICD-10-CM | POA: Diagnosis not present

## 2023-09-02 DIAGNOSIS — T2025XA Burn of second degree of scalp [any part], initial encounter: Secondary | ICD-10-CM | POA: Diagnosis not present

## 2023-09-02 DIAGNOSIS — T300 Burn of unspecified body region, unspecified degree: Secondary | ICD-10-CM | POA: Diagnosis not present

## 2023-09-02 DIAGNOSIS — T310 Burns involving less than 10% of body surface: Secondary | ICD-10-CM | POA: Insufficient documentation

## 2023-09-02 DIAGNOSIS — T20211A Burn of second degree of right ear [any part, except ear drum], initial encounter: Secondary | ICD-10-CM | POA: Diagnosis not present

## 2023-09-02 DIAGNOSIS — Y9259 Other trade areas as the place of occurrence of the external cause: Secondary | ICD-10-CM | POA: Diagnosis not present

## 2023-09-02 DIAGNOSIS — T20219A Burn of second degree of unspecified ear [any part, except ear drum], initial encounter: Secondary | ICD-10-CM

## 2023-09-02 DIAGNOSIS — Z23 Encounter for immunization: Secondary | ICD-10-CM | POA: Diagnosis not present

## 2023-09-02 DIAGNOSIS — T20212A Burn of second degree of left ear [any part, except ear drum], initial encounter: Secondary | ICD-10-CM | POA: Diagnosis not present

## 2023-09-02 DIAGNOSIS — T148XXA Other injury of unspecified body region, initial encounter: Secondary | ICD-10-CM | POA: Diagnosis not present

## 2023-09-02 DIAGNOSIS — T2020XA Burn of second degree of head, face, and neck, unspecified site, initial encounter: Secondary | ICD-10-CM | POA: Diagnosis not present

## 2023-09-02 MED ORDER — BACITRACIN ZINC 500 UNIT/GM EX OINT: 1.0000 | TOPICAL_OINTMENT | Freq: Two times a day (BID) | CUTANEOUS | 0 refills | Status: DC

## 2023-09-02 MED ORDER — HYDROCODONE-ACETAMINOPHEN 5-325 MG PO TABS
1.0000 | ORAL_TABLET | Freq: Once | ORAL | Status: AC
  Administered 2023-09-02: 1 via ORAL
  Filled 2023-09-02: qty 1

## 2023-09-02 MED ORDER — TETANUS-DIPHTH-ACELL PERTUSSIS 5-2.5-18.5 LF-MCG/0.5 IM SUSY
0.5000 mL | PREFILLED_SYRINGE | Freq: Once | INTRAMUSCULAR | Status: AC
  Administered 2023-09-02: 0.5 mL via INTRAMUSCULAR
  Filled 2023-09-02: qty 0.5

## 2023-09-02 MED ORDER — HYDROCODONE-ACETAMINOPHEN 5-325 MG PO TABS: 1.0000 | ORAL_TABLET | ORAL | 0 refills | Status: DC | PRN

## 2023-09-02 MED ORDER — BACITRACIN ZINC 500 UNIT/GM EX OINT
TOPICAL_OINTMENT | Freq: Once | CUTANEOUS | Status: AC
  Administered 2023-09-02: 4 via TOPICAL
  Filled 2023-09-02: qty 0.9
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
## 2023-09-03 ENCOUNTER — Other Ambulatory Visit (HOSPITAL_COMMUNITY): Payer: Self-pay

## 2023-09-03 NOTE — Telephone Encounter (Signed)
 Pharmacy Patient Advocate Encounter  Received notification from [facility] that Prior Authorization for REPATHA has been APPROVED from 09/01/23 to 03/02/24. Unable to obtain price due to refill too soon rejection, last fill date 08/21/23 next available fill date 09/11/23

## 2023-09-04 NOTE — Telephone Encounter (Signed)
 Called left message on patient's voice mail she will be able to refill Repatha the next the medication is needed Any question may call back

## 2023-09-05 ENCOUNTER — Other Ambulatory Visit: Payer: Self-pay

## 2023-09-05 ENCOUNTER — Emergency Department (HOSPITAL_BASED_OUTPATIENT_CLINIC_OR_DEPARTMENT_OTHER): Admission: EM | Admit: 2023-09-05 | Discharge: 2023-09-05 | Disposition: A | Discharge status: Home / Self Care

## 2023-09-05 ENCOUNTER — Encounter (HOSPITAL_BASED_OUTPATIENT_CLINIC_OR_DEPARTMENT_OTHER): Payer: Self-pay

## 2023-09-05 DIAGNOSIS — Z7902 Long term (current) use of antithrombotics/antiplatelets: Secondary | ICD-10-CM | POA: Diagnosis not present

## 2023-09-05 DIAGNOSIS — T20019D Burn of unspecified degree of unspecified ear [any part, except ear drum], subsequent encounter: Secondary | ICD-10-CM | POA: Diagnosis not present

## 2023-09-05 DIAGNOSIS — T2000XA Burn of unspecified degree of head, face, and neck, unspecified site, initial encounter: Secondary | ICD-10-CM | POA: Insufficient documentation

## 2023-09-05 DIAGNOSIS — X088XXD Exposure to other specified smoke, fire and flames, subsequent encounter: Secondary | ICD-10-CM | POA: Diagnosis not present

## 2023-09-05 DIAGNOSIS — T2007XA Burn of unspecified degree of neck, initial encounter: Secondary | ICD-10-CM | POA: Insufficient documentation

## 2023-09-05 DIAGNOSIS — T300 Burn of unspecified body region, unspecified degree: Secondary | ICD-10-CM

## 2023-09-05 DIAGNOSIS — H05221 Edema of right orbit: Secondary | ICD-10-CM | POA: Diagnosis not present

## 2023-09-05 DIAGNOSIS — R110 Nausea: Secondary | ICD-10-CM | POA: Diagnosis not present

## 2023-09-05 DIAGNOSIS — R531 Weakness: Secondary | ICD-10-CM | POA: Diagnosis not present

## 2023-09-05 DIAGNOSIS — T2010XA Burn of first degree of head, face, and neck, unspecified site, initial encounter: Secondary | ICD-10-CM | POA: Diagnosis not present

## 2023-09-05 DIAGNOSIS — T2020XA Burn of second degree of head, face, and neck, unspecified site, initial encounter: Secondary | ICD-10-CM | POA: Diagnosis not present
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
## 2023-09-05 NOTE — ED Notes (Signed)
 ED Provider at bedside.
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
## 2023-09-08 ENCOUNTER — Telehealth: Payer: Self-pay | Admitting: Cardiology

## 2023-09-08 MED ORDER — REPATHA SURECLICK 140 MG/ML ~~LOC~~ SOAJ: 140.0000 mg | SUBCUTANEOUS | 3 refills | Status: DC

## 2023-09-08 NOTE — Telephone Encounter (Signed)
*  STAT* If patient is at the pharmacy, call can be transferred to refill team.   1. Which medications need to be refilled? (please list name of each medication and dose if known)   Evolocumab (REPATHA SURECLICK) 140 MG/ML SOAJ    2. Which pharmacy/location (including street and city if local pharmacy) is medication to be sent to?  [id number] - [place], [place] - [address] AT [facility] OF [address]    3. Do they need a 30 day or 90 day supply? 90

## 2023-09-11 ENCOUNTER — Ambulatory Visit (HOSPITAL_BASED_OUTPATIENT_CLINIC_OR_DEPARTMENT_OTHER): Payer: Medicare HMO | Admitting: Pulmonary Disease

## 2023-09-12 ENCOUNTER — Encounter (HOSPITAL_BASED_OUTPATIENT_CLINIC_OR_DEPARTMENT_OTHER): Payer: Self-pay

## 2023-09-15 ENCOUNTER — Encounter: Payer: Self-pay | Admitting: Physician Assistant

## 2023-09-15 ENCOUNTER — Ambulatory Visit: Payer: Medicare HMO | Attending: Physician Assistant | Admitting: Physician Assistant

## 2023-09-15 VITALS — BP 125/74 | HR 81 | Ht 65.0 in | Wt 134.2 lb

## 2023-09-15 DIAGNOSIS — Z720 Tobacco use: Secondary | ICD-10-CM

## 2023-09-15 DIAGNOSIS — Z01810 Encounter for preprocedural cardiovascular examination: Secondary | ICD-10-CM

## 2023-09-15 DIAGNOSIS — I2510 Atherosclerotic heart disease of native coronary artery without angina pectoris: Secondary | ICD-10-CM

## 2023-09-15 DIAGNOSIS — E785 Hyperlipidemia, unspecified: Secondary | ICD-10-CM

## 2023-09-15 DIAGNOSIS — I10 Essential (primary) hypertension: Secondary | ICD-10-CM | POA: Diagnosis not present
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
## 2023-09-15 NOTE — Patient Instructions (Signed)
 Medication Instructions:  NO CHANGES *If you need a refill on your cardiac medications before your next appointment, please call your pharmacy*  Lab Work: NO LABS If you have labs (blood work) drawn today and your tests are completely normal, you will receive your results only by: MyChart Message (if you have MyChart) OR A paper copy in the mail If you have any lab test that is abnormal or we need to change your treatment, we will call you to review the results.  Testing/Procedures: NO TESTING  Follow-Up: At [facility], you and your health needs are our priority.  As part of our continuing mission to provide you with exceptional heart care, our providers are all part of one team.  This team includes your primary Cardiologist (physician) and Advanced Practice Providers or APPs (Physician Assistants and Nurse Practitioners) who all work together to provide you with the care you need, when you need it.  Your next appointment:   4-5 month(s)  Provider:   Randene Bustard, MD   Other Instructions   1st Floor: - Lobby - Registration  - Pharmacy  - Lab - Cafe  2nd Floor: - PV Lab - Diagnostic Testing (echo, CT, nuclear med)  3rd Floor: - Vacant  4th Floor: - TCTS (cardiothoracic surgery) - AFib Clinic - Structural Heart Clinic - Vascular Surgery  - Vascular Ultrasound  5th Floor: - HeartCare Cardiology (general and EP) - Clinical Pharmacy for coumadin, hypertension, lipid, weight-loss medications, and med management appointments    Valet parking services will be available as well.  \

## 2023-09-16 DIAGNOSIS — T310 Burns involving less than 10% of body surface: Secondary | ICD-10-CM | POA: Diagnosis not present

## 2023-09-16 DIAGNOSIS — L299 Pruritus, unspecified: Secondary | ICD-10-CM | POA: Diagnosis not present

## 2023-09-16 DIAGNOSIS — Z5321 Procedure and treatment not carried out due to patient leaving prior to being seen by health care provider: Secondary | ICD-10-CM | POA: Diagnosis not present

## 2023-09-17 ENCOUNTER — Telehealth: Payer: Self-pay

## 2023-09-17 MED ORDER — SPIRIVA HANDIHALER 18 MCG IN CAPS: 18.0000 ug | ORAL_CAPSULE | Freq: Every day | RESPIRATORY_TRACT | 0 refills | Status: DC

## 2023-09-17 NOTE — Telephone Encounter (Signed)
 Copied from CRM [id number]. Topic: Clinical - Prescription Issue >> Sep 17, 2023  2:26 PM Tyronne Galloway wrote: Reason for CRM: Patient's pulmonary provider left the clinic and she now has a new patient office visit with Dr. Baldwin Levee scheduled for 4/30 at 2pm. The previous refill for med [facility] HANDIHALER 18 MCG inhalation capsule was denied at preferred pharmacy [id number] Jonette Nestle, [place] - [address] AT [facility] OF [facility] & [address] [place] [address] Phone: [phone] Fax: [phone] Hours: Not open 24 hours Is it possible for the patient to obtain a refill before her office visit? Please follow up with the patient at [phone].   Refill sent. Pi on vm. NFN

## 2023-09-22 ENCOUNTER — Other Ambulatory Visit: Payer: Medicare HMO

## 2023-09-24 ENCOUNTER — Inpatient Hospital Stay: Payer: Medicare HMO | Attending: Internal Medicine | Admitting: Internal Medicine

## 2023-09-24 ENCOUNTER — Inpatient Hospital Stay

## 2023-09-24 VITALS — BP 139/74 | HR 64 | Temp 97.6°F | Resp 17 | Ht 65.0 in | Wt 134.6 lb

## 2023-09-24 DIAGNOSIS — Z720 Tobacco use: Secondary | ICD-10-CM | POA: Diagnosis not present

## 2023-09-24 DIAGNOSIS — Z87828 Personal history of other (healed) physical injury and trauma: Secondary | ICD-10-CM | POA: Diagnosis not present

## 2023-09-24 DIAGNOSIS — Z85118 Personal history of other malignant neoplasm of bronchus and lung: Secondary | ICD-10-CM | POA: Diagnosis not present

## 2023-09-24 DIAGNOSIS — C3490 Malignant neoplasm of unspecified part of unspecified bronchus or lung: Secondary | ICD-10-CM

## 2023-09-24 LAB — CMP (CANCER CENTER ONLY)
ALT: 8 U/L (ref 0–44)
AST: 13 U/L — ABNORMAL LOW (ref 15–41)
Albumin: 4.3 g/dL (ref 3.5–5.0)
Alkaline Phosphatase: 53 U/L (ref 38–126)
Anion gap: 4 — ABNORMAL LOW (ref 5–15)
BUN: 17 mg/dL (ref 8–23)
CO2: 30 mmol/L (ref 22–32)
Calcium: 9.9 mg/dL (ref 8.9–10.3)
Chloride: 107 mmol/L (ref 98–111)
Creatinine: 0.76 mg/dL (ref 0.44–1.00)
GFR, Estimated: >60 mL/min (ref >60)
Glucose, Bld: 82 mg/dL (ref 70–99)
Potassium: 4.3 mmol/L (ref 3.5–5.1)
Sodium: 141 mmol/L (ref 135–145)
Total Bilirubin: 0.5 mg/dL (ref 0.0–1.2)
Total Protein: 6.6 g/dL (ref 6.5–8.1)

## 2023-09-24 LAB — CBC WITH DIFFERENTIAL (CANCER CENTER ONLY)
Abs Immature Granulocytes: 0.02 10*3/uL (ref 0.00–0.07)
Basophils Absolute: 0 10*3/uL (ref 0.0–0.1)
Basophils Relative: 0 %
Eosinophils Absolute: 0.1 10*3/uL (ref 0.0–0.5)
Eosinophils Relative: 1 %
HCT: 40.9 % (ref 36.0–46.0)
Hemoglobin: 13.7 g/dL (ref 12.0–15.0)
Immature Granulocytes: 0 %
Lymphocytes Relative: 15 %
Lymphs Abs: 0.7 10*3/uL (ref 0.7–4.0)
MCH: 30 pg (ref 26.0–34.0)
MCHC: 33.5 g/dL (ref 30.0–36.0)
MCV: 89.7 fL (ref 80.0–100.0)
Monocytes Absolute: 0.4 10*3/uL (ref 0.1–1.0)
Monocytes Relative: 7 %
Neutro Abs: 3.8 10*3/uL (ref 1.7–7.7)
Neutrophils Relative %: 77 %
Platelet Count: 203 10*3/uL (ref 150–400)
RBC: 4.56 MIL/uL (ref 3.87–5.11)
RDW: 13.2 % (ref 11.5–15.5)
WBC Count: 5 10*3/uL (ref 4.0–10.5)
nRBC: 0 % (ref 0.0–0.2)
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
## 2023-09-30 DIAGNOSIS — T310 Burns involving less than 10% of body surface: Secondary | ICD-10-CM | POA: Diagnosis not present

## 2023-10-01 ENCOUNTER — Ambulatory Visit: Admitting: Emergency Medicine

## 2023-10-01 ENCOUNTER — Encounter: Payer: Self-pay | Admitting: Emergency Medicine

## 2023-10-01 VITALS — BP 140/78 | HR 64 | Ht 65.0 in | Wt 136.0 lb

## 2023-10-01 DIAGNOSIS — C3491 Malignant neoplasm of unspecified part of right bronchus or lung: Secondary | ICD-10-CM

## 2023-10-01 DIAGNOSIS — J439 Emphysema, unspecified: Secondary | ICD-10-CM

## 2023-10-01 DIAGNOSIS — J449 Chronic obstructive pulmonary disease, unspecified: Secondary | ICD-10-CM

## 2023-10-01 DIAGNOSIS — Z720 Tobacco use: Secondary | ICD-10-CM

## 2023-10-01 DIAGNOSIS — F17210 Nicotine dependence, cigarettes, uncomplicated: Secondary | ICD-10-CM | POA: Diagnosis not present

## 2023-10-01 NOTE — Assessment & Plan Note (Signed)
 Please continue Spiriva  once daily Continue Arnuity once daily.  Rinse and gargle after using. Keep your Xopenex  available to use 2 puffs when needed for shortness of breath, chest tightness, wheezing. Okay to use guaifenesin  (Mucinex ) 600 mg once daily. Keep your flu shot up-to-date.  You would benefit from getting the COVID-19 vaccine as well Follow in our office in 1 year, sooner if you have any problems.

## 2023-10-01 NOTE — Assessment & Plan Note (Signed)
 Please try to work on decreasing your cigarettes.  Ultimate goal would be to stop altogether.  This is very important for your overall health.
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
## 2023-10-01 NOTE — Assessment & Plan Note (Signed)
 Get your repeat CT scan of the chest as planned by Dr. Marguerita Shih and follow-up with him as scheduled.

## 2023-10-01 NOTE — Patient Instructions (Addendum)
 Please continue Spiriva  once daily Continue Arnuity once daily.  Rinse and gargle after using. Keep your Xopenex  available to use 2 puffs when needed for shortness of breath, chest tightness, wheezing. Okay to use guaifenesin  (Mucinex ) 600 mg once daily. Keep your flu shot up-to-date.  You would benefit from getting the COVID-19 vaccine as well Please try to work on decreasing your cigarettes.  Ultimate goal would be to stop altogether.  This is very important for your overall health. Get your repeat CT scan of the chest as planned by Dr. Marguerita Shih and follow-up with him as scheduled. Follow in our office in 1 year, sooner if you have any problems.

## 2023-10-07 DIAGNOSIS — C3490 Malignant neoplasm of unspecified part of unspecified bronchus or lung: Secondary | ICD-10-CM | POA: Diagnosis not present

## 2023-10-07 DIAGNOSIS — R0789 Other chest pain: Secondary | ICD-10-CM | POA: Diagnosis not present

## 2023-10-07 DIAGNOSIS — R7303 Prediabetes: Secondary | ICD-10-CM | POA: Diagnosis not present

## 2023-10-07 DIAGNOSIS — M25561 Pain in right knee: Secondary | ICD-10-CM | POA: Diagnosis not present

## 2023-10-07 DIAGNOSIS — R5383 Other fatigue: Secondary | ICD-10-CM | POA: Diagnosis not present

## 2023-10-07 DIAGNOSIS — E7800 Pure hypercholesterolemia, unspecified: Secondary | ICD-10-CM | POA: Diagnosis not present

## 2023-10-07 DIAGNOSIS — M25562 Pain in left knee: Secondary | ICD-10-CM | POA: Diagnosis not present

## 2023-10-07 DIAGNOSIS — J449 Chronic obstructive pulmonary disease, unspecified: Secondary | ICD-10-CM | POA: Diagnosis not present

## 2023-10-07 DIAGNOSIS — R29898 Other symptoms and signs involving the musculoskeletal system: Secondary | ICD-10-CM | POA: Diagnosis not present

## 2023-10-07 DIAGNOSIS — I2510 Atherosclerotic heart disease of native coronary artery without angina pectoris: Secondary | ICD-10-CM | POA: Diagnosis not present

## 2023-10-16 ENCOUNTER — Telehealth: Payer: Self-pay | Admitting: Cardiology

## 2023-10-16 MED ORDER — PRAVASTATIN SODIUM 80 MG PO TABS: ORAL_TABLET | ORAL | 3 refills | Status: DC

## 2023-10-16 NOTE — Telephone Encounter (Signed)
 [name] medication was sent to pt's pharmacy as requested. Confirmation received.

## 2023-10-16 NOTE — Telephone Encounter (Signed)
*  STAT* If patient is at the pharmacy, call can be transferred to refill team.   1. Which medications need to be refilled? (please list name of each medication and dose if known) pravastatin  (PRAVACHOL ) 80 MG tablet    4. Which pharmacy/location (including street and city if local pharmacy) is medication to be sent to?  [id number] - [place], [place] - [address] AT [facility] [address]     5. Do they need a 30 day or 90 day supply? 90

## 2023-10-24 ENCOUNTER — Ambulatory Visit: Payer: Self-pay | Admitting: Family Medicine

## 2023-10-24 ENCOUNTER — Ambulatory Visit: Payer: Self-pay

## 2023-10-24 ENCOUNTER — Ambulatory Visit: Admission: EM | Admit: 2023-10-24 | Discharge: 2023-10-24 | Disposition: A | Discharge status: Home / Self Care

## 2023-10-24 DIAGNOSIS — N3020 Other chronic cystitis without hematuria: Secondary | ICD-10-CM | POA: Diagnosis not present

## 2023-10-24 NOTE — Telephone Encounter (Signed)
 Chief Complaint: Coughing up blood x1 month  Symptoms: Coughing "for a while," blood in cough x1 month, difficulty breathing x2-3 weeks, weakness x2-3 weeks, intermittent chest pain x2-3 weeks intermittent, blood less than 1/2 cup  Disposition: [x] ED / [x] Refused Recommended Disposition  Additional Notes: This RN advised pt to go to ED. Pt states she does not want to go. This RN notified [facility] of pt refusal. This RN educated pt on importance of being seen today.   Copied from CRM [id number]. Topic: Clinical - Red Word Triage >> Oct 24, 2023 10:47 AM Juliaette Ober wrote: Red Word that prompted transfer to Nurse Triage: patient is spitting up blood Reason for Disposition  [1] Chest pain AND [2] difficulty breathing  Answer Assessment - Initial Assessment Questions Chief Complaint: Coughing up blood  Symptoms: Coughing "for a while," blood in cough x1 month, difficulty  breathing x2-3 weeks, weakness x2-3 weeks, intermittent chest pain  Protocols used: Coughing Up Blood-A-AH

## 2023-10-24 NOTE — Telephone Encounter (Signed)
 Please advise Dr. Delton Coombes

## 2023-10-24 NOTE — Telephone Encounter (Signed)
 She needs to be seen urgently - go to the ED for acute evaluation

## 2023-10-24 NOTE — Telephone Encounter (Signed)
 Duplicate. Message has already been routed to provider.

## 2023-10-24 NOTE — Telephone Encounter (Signed)
 Pt reports she has been coughing up blood with increasing amount/frequency x 3 days. No OV available, advised UC, pt agreeable. This RN educated pt on home care, new-worsening symptoms, when to call back/seek emergent care. Pt verbalized understanding and agrees to plan.    E2C2 Pulmonary Triage - Initial Assessment Questions "Chief Complaint (e.g., cough, sob, wheezing, fever, chills, sweat or additional symptoms) *Go to specific symptom protocol after initial questions. Coughing up blood  "How long have symptoms been present?" X 3 days  Have you tested for COVID or Flu? Note: If not, ask patient if a home test can be taken. If so, instruct patient to call back for positive results. No  MEDICINES:   "Have you used any OTC meds to help with symptoms?" Yes If yes, ask "What medications?" Mucinex   "Have you used your inhalers/maintenance medication?" Yes If yes, "What medications?" As prescribed  If inhaler, ask "How many puffs and how often?" Note: Review instructions on medication in the chart. "Not like I should"  OXYGEN : "Do you wear supplemental oxygen ?" No If yes, "How many liters are you supposed to use?" NA  "Do you monitor your oxygen  levels?" No If yes, "What is your reading (oxygen  level) today?" NA  "What is your usual oxygen  saturation reading?"  (Note: Pulmonary O2 sats should be 90% or greater) NA   Copied from CRM #[id number]. Topic: Clinical - Red Word Triage >> Oct 24, 2023 10:07 AM Tyronne Galloway wrote: Red Word that prompted transfer to Nurse Triage: Pt stated she is currently coughing up blood for the past 2 to 3 days. Last night and this morning it has escalated and every time she coughs it comes up blood. Reason for Disposition  [1] Coughed up blood AND [2] > 1 tablespoon (15 ml)  Answer Assessment - Initial Assessment Questions 1. ONSET: "When did the cough begin?"      X 3 days ago 3. SPUTUM: "Describe the color of your sputum" (none, dry cough; clear,  white, yellow, green)     Clear 4. HEMOPTYSIS: "How much blood?" (flecks, streaks, tablespoons, etc.)     Streaks 5. DIFFICULTY BREATHING: "Are you having difficulty breathing?" If Yes, ask: "How bad is it?" (e.g., mild, moderate, severe)    - MILD: No SOB at rest, mild SOB with walking, speaks normally in sentences, can lie down, no retractions, pulse < 100.    - MODERATE: SOB at rest, SOB with minimal exertion and prefers to sit, cannot lie down flat, speaks in phrases, mild retractions, audible wheezing, pulse 100-120.    - SEVERE: Very SOB at rest, speaks in single words, struggling to breathe, sitting hunched forward, retractions, pulse > 120      Worse than normal 6. FEVER: "Do you have a fever?" If Yes, ask: "What is your temperature, how was it measured, and when did it start?"     None  8. LUNG HISTORY: "Do you have any history of lung disease?"  (e.g., pulmonary embolus, asthma, emphysema)     COPD, Lung Cancer  10. OTHER SYMPTOMS: "Do you have any other symptoms?" (e.g., runny nose, wheezing, chest pain)       CP from coughing, not currently  Protocols used: Coughing Up Blood-A-AH

## 2023-10-28 ENCOUNTER — Ambulatory Visit: Payer: Self-pay | Admitting: Emergency Medicine

## 2023-10-28 MED ORDER — DOXYCYCLINE HYCLATE 100 MG PO TABS: 100.0000 mg | ORAL_TABLET | Freq: Two times a day (BID) | ORAL | 0 refills | Status: DC

## 2023-10-28 NOTE — Telephone Encounter (Signed)
 ATC pt, no answer- left detailed vm for pt to call back . Pt was seen in [facility] 5/23.

## 2023-10-28 NOTE — Telephone Encounter (Signed)
 Copied from CRM [id number]. Topic: General - Other >> Oct 28, 2023  1:05 PM Eveleen Hinds B wrote: Reason for CRM: Patient returning call to [place]. Please call.  I have spoken with pt previously.  Nothing is further needed

## 2023-10-28 NOTE — Telephone Encounter (Signed)
 Chief Complaint: productive cough Symptoms: productive white cough, hemoptysis (resolved) Frequency: x 1 month Pertinent Negatives: Patient denies fever, SOB above baseline, URI sx, CP Disposition: [] ED /[] Urgent Care (no appt availability in office) / [] Appointment(In office/virtual)/ []  [facility]/ [] Home Care/ [] Refused Recommended Disposition /[] [facility]/ [x]  Follow-up with Pulm Additional Notes: Pt c/o productive white cough x 1 month and intermittent blood streaked sputum. Pt reports no longer blood streaked, but still thick and white. Pt endorses recently went to UC for evaluation, but reports nothing was done. Pt does endorse some confusion as she thought UC would do bloodwork and CXR, but did not. Additionally, pt reports recent Rx from urology for possible UTI. Pt had multiple concerns and confusion on next steps and triager reinforced pt to F/U with urology provider re: Rx.  Of note, triager did see previous triage pulmonary encounter that advised UC for evaluation of hemoptysis. Triager does not appreciate audible SOB/wheezing during call. Pt is speaking in full sentences. Pt reports taking respiratory medications as instructed and is not experiencing worsening SOB above baseline. Triager attempted to schedule with [facility], but no access. Triager will forward encounter for Dr Baldwin Levee 's office to review and advise. Patient verbalized understanding and is expecting call back from office for next steps. Triager also advised that if pt does not hear back from office, to follow disposition for further evaluation/treatment.       Copied from CRM [id number]. Topic: Clinical - Red Word Triage >> Oct 28, 2023 10:56 AM Hilton Lucky wrote: Red Word that prompted transfer to Nurse Triage: F/u call from previous encounter - spitting up blood has ceased but patient would like advisement on what to take Reason for Disposition  [1] Has underlying lung disease (e.g., COPD, chronic  bronchitis or emphysema) AND [2] sputum has turned yellow or green in color  Answer Assessment - Initial Assessment Questions E2C2 Pulmonary Triage - Initial Assessment Questions "Chief Complaint (e.g., cough, sob, wheezing, fever, chills, sweat or additional symptoms) *Go to specific symptom protocol after initial questions. Productive cough  "How long have symptoms been present?" X 1 mo  Have you tested for COVID or Flu? Note: If not, ask patient if a home test can be taken. If so, instruct patient to call back for positive results. No  MEDICINES:   "Have you used any OTC meds to help with symptoms?" No If yes, ask "What medications?" N/a  "Have you used your inhalers/maintenance medication?" Yes If yes, "What medications?" Levalbuterol  - "a couple puffs" PRN Arnuity - 1 puff daily Spiriva  daily  If inhaler, ask "How many puffs and how often?" Note: Review instructions on medication in the chart. See above  OXYGEN : "Do you wear supplemental oxygen ?" No If yes, "How many liters are you supposed to use?" N/a  "Do you monitor your oxygen  levels?" Yes If yes, "What is your reading (oxygen  level) today?" "It wasn't bad" "I haven't taken it today" 98%  "What is your usual oxygen  saturation reading?"  (Note: Pulmonary O2 sats should be 90% or greater) 96   1. ONSET: "When did the cough begin?"      Noticed hemoptysis x 1 month intermittently 2. SEVERITY: "How bad is the cough today?" "Did the blood appear after a coughing spell?"      N/a 3. SPUTUM: "Describe the color of your sputum" (none, dry cough; clear, white, yellow, green)     Thick, white 4. HEMOPTYSIS: "How much blood?" (flecks, streaks, tablespoons, etc.)  Streaks of blood, self-resolved 5. DIFFICULTY BREATHING: "Are you having difficulty breathing?" If Yes, ask: "How bad is it?" (e.g., mild, moderate, severe)    - MILD: No SOB at rest, mild SOB with walking, speaks normally in sentences, can lie down, no  retractions, pulse < 100.    - MODERATE: SOB at rest, SOB with minimal exertion and prefers to sit, cannot lie down flat, speaks in phrases, mild retractions, audible wheezing, pulse 100-120.    - SEVERE: Very SOB at rest, speaks in single words, struggling to breathe, sitting hunched forward, retractions, pulse > 120      Baseline SOB 6. FEVER: "Do you have a fever?" If Yes, ask: "What is your temperature, how was it measured, and when did it start?"     denies 7. CARDIAC HISTORY: "Do you have any history of heart disease?" (e.g., heart attack, congestive heart failure)      Hx of heart attack 8. LUNG HISTORY: "Do you have any history of lung disease?"  (e.g., pulmonary embolus, asthma, emphysema)     emphysema 9. PE RISK FACTORS: "Do you have a history of blood clots?" (or: recent major surgery, recent prolonged travel, bedridden)     denies 10. OTHER SYMPTOMS: "Do you have any other symptoms?" (e.g., runny nose, wheezing, chest pain)       Runny nose 11. PREGNANCY: "Is there any chance you are pregnant?" "When was your last menstrual period?"       N/a 12. TRAVEL: "Have you traveled out of the country in the last month?" (e.g., travel history, exposures)       denies  Protocols used: Coughing Up Blood-A-AH

## 2023-10-28 NOTE — Telephone Encounter (Signed)
 Routing to front, can you please schedule pt appt here or [facility]. Thank you.

## 2023-10-28 NOTE — Telephone Encounter (Signed)
 Spoke with pt. Doxy has been sent to pharm & pt is aware. NFN

## 2023-10-28 NOTE — Addendum Note (Signed)
 Addended byGregory Leash, Maeryn Mcgath A on: 10/28/2023 03:01 PM   Modules accepted: Orders

## 2023-10-28 NOTE — Telephone Encounter (Signed)
**Note De-identified  [place] Obfuscation** Please advise 

## 2023-10-28 NOTE — Telephone Encounter (Signed)
 Would call in doxycycline  100mg  bid x 7 days And get her an OV w RB or APP next available to insure that she improves. Does not need to be a blocked slot.

## 2023-10-29 DIAGNOSIS — N3020 Other chronic cystitis without hematuria: Secondary | ICD-10-CM | POA: Diagnosis not present

## 2023-11-01 DIAGNOSIS — I5032 Chronic diastolic (congestive) heart failure: Secondary | ICD-10-CM | POA: Diagnosis not present

## 2023-11-01 DIAGNOSIS — E7800 Pure hypercholesterolemia, unspecified: Secondary | ICD-10-CM | POA: Diagnosis not present

## 2023-11-01 DIAGNOSIS — J449 Chronic obstructive pulmonary disease, unspecified: Secondary | ICD-10-CM | POA: Diagnosis not present

## 2023-11-01 DIAGNOSIS — C3491 Malignant neoplasm of unspecified part of right bronchus or lung: Secondary | ICD-10-CM | POA: Diagnosis not present

## 2023-11-03 ENCOUNTER — Encounter (HOSPITAL_COMMUNITY): Payer: Self-pay

## 2023-11-03 ENCOUNTER — Ambulatory Visit (HOSPITAL_COMMUNITY)
Admission: RE | Admit: 2023-11-03 | Discharge: 2023-11-03 | Disposition: A | Discharge status: Home / Self Care | Source: Ambulatory Visit | Attending: Internal Medicine | Admitting: Internal Medicine

## 2023-11-03 DIAGNOSIS — C3490 Malignant neoplasm of unspecified part of unspecified bronchus or lung: Secondary | ICD-10-CM | POA: Insufficient documentation

## 2023-11-03 DIAGNOSIS — J432 Centrilobular emphysema: Secondary | ICD-10-CM | POA: Diagnosis not present

## 2023-11-03 MED ORDER — SODIUM CHLORIDE (PF) 0.9 % IJ SOLN
INTRAMUSCULAR | Status: AC
  Filled 2023-11-03: qty 50

## 2023-11-03 MED ORDER — IOHEXOL 300 MG/ML  SOLN
75.0000 mL | Freq: Once | INTRAMUSCULAR | Status: AC | PRN
  Administered 2023-11-03: 75 mL via INTRAVENOUS

## 2023-11-04 ENCOUNTER — Telehealth: Payer: Self-pay | Admitting: Medical Oncology

## 2023-11-04 NOTE — Telephone Encounter (Signed)
 F/U phone call from yesterday . She asked why she had another CT scan yesterday. When she had one in March ( by another provider).

## 2023-11-06 ENCOUNTER — Other Ambulatory Visit: Payer: Self-pay | Admitting: Internal Medicine

## 2023-11-06 DIAGNOSIS — C3490 Malignant neoplasm of unspecified part of unspecified bronchus or lung: Secondary | ICD-10-CM

## 2023-11-10 DIAGNOSIS — T310 Burns involving less than 10% of body surface: Secondary | ICD-10-CM | POA: Diagnosis not present

## 2023-11-11 ENCOUNTER — Telehealth: Payer: Self-pay

## 2023-11-11 NOTE — Telephone Encounter (Signed)
 Spoke with patient this morning in regards to lab results and CT scan.  Reviewed labs with patient from 09/24/23 and nothing is concerning at this time.  She states that she went to her PCP and she said that she has cancer. Informed patient that she was diagnosed with lung cancer in 2017, had chemotherapy and radiation and now she is on observation.    She had questions about her CT scan and why she received two within 3 months from each other.  Informed her that Dr. Bing Buff order for the CT scan was for 09/2024, so I'm not sure why she got another CT.  Reviewed Dr. Bing Buff last note with the patient stating that the CT scan showed no evidence of disease progression or metastasis and she can continue on observation and follow up with Dr. Marguerita Shih in 1 year.  She was told to call with any new symptoms.

## 2023-11-13 ENCOUNTER — Other Ambulatory Visit: Payer: Self-pay | Admitting: Nurse Practitioner

## 2023-11-14 ENCOUNTER — Encounter: Payer: Self-pay | Admitting: Nurse Practitioner

## 2023-11-14 ENCOUNTER — Telehealth (INDEPENDENT_AMBULATORY_CARE_PROVIDER_SITE_OTHER): Admitting: Nurse Practitioner

## 2023-11-14 VITALS — Ht 61.0 in

## 2023-11-14 DIAGNOSIS — J440 Chronic obstructive pulmonary disease with acute lower respiratory infection: Secondary | ICD-10-CM

## 2023-11-14 DIAGNOSIS — J209 Acute bronchitis, unspecified: Secondary | ICD-10-CM

## 2023-11-14 DIAGNOSIS — C3491 Malignant neoplasm of unspecified part of right bronchus or lung: Secondary | ICD-10-CM

## 2023-11-14 DIAGNOSIS — J439 Emphysema, unspecified: Secondary | ICD-10-CM | POA: Diagnosis not present

## 2023-11-14 MED ORDER — PREDNISONE 10 MG PO TABS
ORAL_TABLET | ORAL | 0 refills | Status: DC
Start: 2023-11-14 — End: 2024-01-19

## 2023-11-14 MED ORDER — HYDROCODONE BIT-HOMATROP MBR 5-1.5 MG/5ML PO SOLN: 5.0000 mL | Freq: Four times a day (QID) | ORAL | 0 refills | Status: DC | PRN
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
## 2023-11-14 NOTE — Assessment & Plan Note (Signed)
 Moderate COPD with unresolved cough. Infectious symptoms improved following empiric doxycycline . No evidence of superimposed infection on recent CT imaging. Will treat her with prednisone  taper; doesn't tolerate higher doses. Cough control measures. Continue aggressive maintenance regimen. Action plan in place. Aware to notify if symptoms fail to improve/worsen.  Patient Instructions  Continue levalbuterol  inhaler 2 puffs or 3 mL neb every 6 hours as needed for shortness of breath or wheezing. Notify if symptoms persist despite rescue inhaler/neb use. Follow nebs with flutter valve 10 times 2-3 times a day Continue arnuity 1 puff daily. Brush tongue and rinse mouth afterwards Continue spiriva  2 puffs daily Continue flonase  nasal spray 2 sprays each nostril daily  Continue guaifenesin  [phone] mg Twice daily for cough/congestion Use hypertonic saline nebs Twice daily to help clear mucus    Prednisone  10 mg daily for 7 days then 5 mg daily for 7 days. Take in AM with food. Hycodan cough syrup 5 mL every 6 hours as needed for severe coughing. Do not drive after taking. May cause dizziness and unsteadiness. Could increase risk of falling. Do not take with other sedating medications    Follow up in 6-8 weeks with Dr. Baldwin Levee or Gina Lagos. If symptoms do not improve or worsen, please contact office for sooner follow up or seek emergency care.

## 2023-11-14 NOTE — Assessment & Plan Note (Signed)
 S/p chemotherapy. Recent CT without evidence of progression. Follow up with oncology as scheduled.

## 2023-11-14 NOTE — Patient Instructions (Signed)
 Continue levalbuterol  inhaler 2 puffs or 3 mL neb every 6 hours as needed for shortness of breath or wheezing. Notify if symptoms persist despite rescue inhaler/neb use. Follow nebs with flutter valve 10 times 2-3 times a day Continue arnuity 1 puff daily. Brush tongue and rinse mouth afterwards Continue spiriva  2 puffs daily Continue flonase  nasal spray 2 sprays each nostril daily  Continue guaifenesin  [phone] mg Twice daily for cough/congestion Use hypertonic saline nebs Twice daily to help clear mucus    Prednisone  10 mg daily for 7 days then 5 mg daily for 7 days. Take in AM with food. Hycodan cough syrup 5 mL every 6 hours as needed for severe coughing. Do not drive after taking. May cause dizziness and unsteadiness. Could increase risk of falling. Do not take with other sedating medications    Follow up in 6-8 weeks with Dr. Baldwin Levee or Gina Lagos. If symptoms do not improve or worsen, please contact office for sooner follow up or seek emergency care.

## 2023-11-17 ENCOUNTER — Ambulatory Visit: Payer: Self-pay | Admitting: Nurse Practitioner

## 2023-11-17 NOTE — Telephone Encounter (Signed)
 Stop prednisone . Needs to go to the ED if having CP and palpitations.

## 2023-11-17 NOTE — Telephone Encounter (Signed)
 Spoke with Andy Bannister friend on [place] . Stated patient was sleeping at the time of the call advised Andy Bannister per Alston Jerry  Stop prednisone . Needs to go to the ED if having CP and palpitations.     Karie Ou I will post this message on patient's Mychart.   Thomas voice was understanding. Nothing else further needed.

## 2023-11-17 NOTE — Telephone Encounter (Addendum)
 FYI Only or Action Required?: Action required by provider  Patient is followed in Pulmonology for COPD and lung cancer, last seen on 11/14/2023 by Roetta Clarke, NP. Called Nurse Triage reporting Palpitations, medication question, Tachycardia, Medication Reaction, Chest Pain, Fatigue, and Muscle Pain. Symptoms began several days ago. Interventions attempted: Nothing. Symptoms are: rapidly worsening.  Triage Disposition: Go to ED or PCP/Alternative with Approval  Patient/caregiver understands and will follow disposition?: No, refuses disposition - Requesting call back to friend Thomas's number [phone] due to her own phone troubles    Advised stop prednisone  until further recommendations from pulm.  Copied from CRM [id number]. Topic: Clinical - Red Word Triage >> Nov 17, 2023  1:42 PM Juliana Ocean wrote: Red Word that prompted transfer to Nurse Triage: pt prescribed prednisone  and it is giving her heart  palpitations. Reason for Disposition  Patient sounds very sick or weak to the triager  Answer Assessment - Initial Assessment Questions Pt is poor historian  E2C2 Pulmonary Triage - Initial Assessment Questions Chief Complaint (e.g., cough, sob, wheezing, fever, chills, sweat or additional symptoms) *Go to specific symptom protocol after initial questions. Started taking prednisone , allergy list with heart palpitations with prednisone  in the past, taken 3 days of it Have heart palpitations anyway but worse with prednisone  No worsening SOB on prednisone  Some chest pain, does not last longer than 5 min at a time, not getting more frequent or more intense  How long have symptoms been present? Saturday  Have you used your inhalers/maintenance medication? Yes If yes, What medications? Including albuterol  inhaler  If inhaler, ask How many puffs and how often? Note: Review instructions on medication in the chart. Sometimes 2x/day, some days don't need it, used it yesterday  once  OXYGEN : Do you wear supplemental oxygen ? No  Do you monitor your oxygen  levels? Yes, no reading for today  1. DESCRIPTION: Please describe your heart rate or heartbeat that you are having (e.g., fast/slow, regular/irregular, skipped or extra beats, palpitations)     Much faster than usual  3. DURATION: How long does it last (e.g., seconds, minutes, hours)     Maybe 5-10 seconds of several heart palpitations Don't understand why they gave it to me, they know I'm allergic to it  8. CAUSE: What do you think is causing the palpitations?     Prednisone   9. CARDIAC HISTORY: Do you have any history of heart disease? (e.g., heart attack, angina, bypass surgery, angioplasty, arrhythmia)      Significant  10. OTHER SYMPTOMS: Do you have any other symptoms? (e.g., dizziness, chest pain, sweating, difficulty breathing)       Feel weaker than normal Muscles are hurting in my legs No redness in legs No swelling currently, swelling off and on for a while, goes away if elevate legs No more trouble walking than usual  Can I take mucinex  with prednisone ? Can I have something else for my lungs, can't remember the name of it, don't want to start over, already taken 3 days prednisone  Pt reporting phone troubles, pt preferring that she receive call back at friend Thomas's phone [phone] so can hear the ringer and not miss the call Advised ED, refusing ED, sending message for call back  Protocols used: Heart Rate and Heartbeat Questions-A-AH

## 2023-11-18 ENCOUNTER — Telehealth: Payer: Self-pay | Admitting: Medical Oncology

## 2023-11-18 NOTE — Telephone Encounter (Signed)
 Returned patient's call. Michele [name] expressed concern after reviewing her MyChart, which displayed the diagnosis code Malignant neoplasm of unspecified part of unspecified bronchus or lung (HCC) [C34.90]. She asked if this meant she has lung cancer.  I reassured her that there is no evidence of cancer on her most recent scan. I explained that the term "malignant neoplasm" is a diagnostic code used to indicate the reason for the scan and is commonly synonymous with cancer; however, this code does not confirm that she has cancer.  Patient verbalized understanding and was reassured.

## 2023-11-20 ENCOUNTER — Telehealth: Payer: Self-pay

## 2023-11-20 NOTE — Telephone Encounter (Signed)
 Just regular guaifenesin  or can use the DM if cough is bothersome. Thanks.

## 2023-11-20 NOTE — Telephone Encounter (Signed)
 Spoke with patient regarding prior message . Patient stated she was not sure what Mucinex  katie wanted patient to take first .Patent stated she does have both medication's.Advised patient I will send this message to katie and once Alston Jerry responds someone from the office will contact her back .  Alston Jerry can you please advise   Thank you

## 2023-11-20 NOTE — Telephone Encounter (Signed)
 Spoke with patient regarding prior message.Per Michele [name]  Just regular guaifenesin  or can use the DM if cough is bothersome Take medication n every 12 hours .  Patient's voice was understanding.Noting else further needed.

## 2023-12-01 ENCOUNTER — Telehealth: Payer: Self-pay | Admitting: Physician Assistant

## 2023-12-01 NOTE — Telephone Encounter (Signed)
 Left message to call back

## 2023-12-01 NOTE — Telephone Encounter (Signed)
 Pt c/o medication issue:  1. Name of Medication:   isosorbide  mononitrate (IMDUR ) 30 MG 24 hr tablet    2. How are you currently taking this medication (dosage and times per day)?once daily   3. Are you having a reaction (difficulty breathing--STAT)? No   4. What is your medication issue? Pt called in stating she doesn't remember Janene, [place] changing this medication to 60mg . She states she has only been taking one 30mg  tablets once daily.

## 2023-12-02 ENCOUNTER — Telehealth: Payer: Self-pay

## 2023-12-02 NOTE — Telephone Encounter (Signed)
 Attempted phone call to phone number provided and left message to contact office at [phone]

## 2023-12-02 NOTE — Telephone Encounter (Signed)
 Patient returned call. Please call her back at [phone].

## 2023-12-02 NOTE — Telephone Encounter (Signed)
 Copied from CRM [id number]. Topic: Clinical - Medication Question >> Dec 02, 2023  1:33 PM Russell PARAS wrote: Reason for CRM:   Pt has questions concerning her medication prescribed by her cardiologist. She states after picking up her prescription for Imdur  and noticing a change. She reports she has been taking 30 mg once at night; however prescription says 2X30 mg tablets a day. She is confused by the wording of daily in the prescription as well, due to believing she was to take at night.   Did recommend reaching out to cardiologist who prescribed the medication; and she reports waiting on a return call from them.   CB#  [phone]   Called and spoke with Debby per DPR. I advised him Imdur  is on her med list and is to be taken as two tablets daily and to reach out to cardiology with any further questions since they prescribed rx. Pts friend verbalized understanding and had no other concerns. Nothing further needed.

## 2023-12-17 ENCOUNTER — Telehealth: Payer: Self-pay | Admitting: Nurse Practitioner

## 2023-12-17 ENCOUNTER — Ambulatory Visit: Payer: Self-pay

## 2023-12-17 NOTE — Telephone Encounter (Unsigned)
 Copied from CRM [id number]. Topic: Clinical - Medication Refill >> Dec 17, 2023  2:18 PM Joesph PARAS wrote: Medication: RESTASIS  0.05 % ophthalmic emulsion  Has the patient contacted their pharmacy? No (Agent: If no, request that the patient contact the pharmacy for the refill. If patient does not wish to contact the pharmacy document the reason why and proceed with request.) (Agent: If yes, when and what did the pharmacy advise?)  This is the patient's preferred pharmacy:  [id number] [place], [place] - [address] AT [facility] & [address] [place] [address] Phone: [phone] Fax: [phone]  Is this the correct pharmacy for this prescription? Yes If no, delete pharmacy and type the correct one.   Has the prescription been filled recently? No  Is the patient out of the medication? Yes  Has the patient been seen for an appointment in the last year OR does the patient have an upcoming appointment? Yes  Can we respond through MyChart? No  Agent: Please be advised that Rx refills may take up to 3 business days. We ask that you follow-up with your pharmacy.

## 2023-12-17 NOTE — Telephone Encounter (Signed)
 FYI Only or Action Required?: Action required by provider: clinical question for provider.  Patient was last seen in primary care on N/A.  Called Nurse Triage reporting Medication Question.  Symptoms began N/A.  Interventions attempted: Other: Imdur  60mg .  Symptoms are: stable.  Triage Disposition: Call PCP Now  Patient/caregiver understands and will follow disposition?: No, wishes to speak with PCP  **Patient wants Imdur  reduced back to 30mg , not 60mg **                          Copied from CRM [id number]. Topic: Clinical - Red Word Triage >> Dec 17, 2023  2:24 PM Joesph PARAS wrote: Red Word that prompted transfer to Nurse Triage: Patient needs medication advice. Reason for Disposition  [1] Caller has URGENT medicine question about med that primary care doctor (or NP/PA) or specialist prescribed AND [2] triager unable to answer question  Answer Assessment - Initial Assessment Questions 1. NAME of MEDICINE: What medicine(s) are you calling about?             Patient called in stating the Isosorbide  60 mg should be 30 mg as she does not like the side effects, she says it makes her drunk. Patient would like a call back from Cardiologist.  Protocols used: Medication Question Call-A-AH

## 2023-12-18 DIAGNOSIS — N3020 Other chronic cystitis without hematuria: Secondary | ICD-10-CM | POA: Diagnosis not present

## 2023-12-26 ENCOUNTER — Telehealth: Payer: Self-pay

## 2023-12-26 NOTE — Telephone Encounter (Signed)
 Copied from CRM #[id number]. Topic: Clinical - Medication Question >> Dec 24, 2023  2:47 PM Celestine FALCON wrote: Reason for CRM: Pt stated on her appt on 4/30 she saw Dr. Shelah, and she was told to take mucinex  1x daily 600 mg. Pt stated the mucinex  she has is called mucinex  dm. Pt wanted to know if it was safe to take mucinex  dm instead of the regular mucinex . Pt wanted to also confirm that he still wants her to take it every day 600 mg.  Pt is requesting a call back to phone number [phone] ok to leave a vm (her roommates's phone number) so she can make sure the phone will ring and she can answer it. >> Dec 25, 2023 11:06 AM Leila BROCKS wrote: Patient [phone] states called yesterday regarding Mucinex  clarification for Dr. Shelah and has not heard from the office. Patient states [phone] is not working, does not ring at all. Please advise and call back at [phone].     Can you advise on this,

## 2023-12-26 NOTE — Telephone Encounter (Signed)
 If no significant cough, can use plain mucinex . If she's having a cough, can use the mucinex  DM. Thanks

## 2023-12-29 NOTE — Telephone Encounter (Signed)
 Called and spoke with the patient regarding mucinex  and what to take. Pt verbalized understanding. Nfn

## 2024-01-09 ENCOUNTER — Telehealth: Payer: Self-pay

## 2024-01-09 NOTE — Telephone Encounter (Signed)
 Spoke with the patient this afternoon regarding a swollen area on the right side of her neck. Patient reported that over the past 3-4 days, she has noticed what appears to be a swollen lymph node. She described the area as warm, red, approximately the size of a quarter, and painful when turning her neck from the ear down to the shoulder. Patient stated she has tried applying ice without relief.  Advised the patient to seek evaluation at an urgent care or emergency department. She shared that she is currently staying at [facility]. Informed her that the nearest urgent care is [facility]. Patient declined evaluation in the ED and agreed to consider urgent care instead.

## 2024-01-19 ENCOUNTER — Ambulatory Visit: Attending: Cardiology | Admitting: Cardiology

## 2024-01-19 ENCOUNTER — Encounter: Payer: Self-pay | Admitting: Cardiology

## 2024-01-19 VITALS — BP 120/60 | HR 85 | Ht 61.0 in | Wt 136.5 lb

## 2024-01-19 DIAGNOSIS — I2119 ST elevation (STEMI) myocardial infarction involving other coronary artery of inferior wall: Secondary | ICD-10-CM

## 2024-01-19 DIAGNOSIS — F17210 Nicotine dependence, cigarettes, uncomplicated: Secondary | ICD-10-CM | POA: Diagnosis not present

## 2024-01-19 DIAGNOSIS — Z9861 Coronary angioplasty status: Secondary | ICD-10-CM

## 2024-01-19 DIAGNOSIS — I5032 Chronic diastolic (congestive) heart failure: Secondary | ICD-10-CM

## 2024-01-19 DIAGNOSIS — I951 Orthostatic hypotension: Secondary | ICD-10-CM

## 2024-01-19 DIAGNOSIS — E785 Hyperlipidemia, unspecified: Secondary | ICD-10-CM | POA: Diagnosis not present

## 2024-01-19 DIAGNOSIS — D692 Other nonthrombocytopenic purpura: Secondary | ICD-10-CM | POA: Diagnosis not present

## 2024-01-19 DIAGNOSIS — Z720 Tobacco use: Secondary | ICD-10-CM

## 2024-01-19 DIAGNOSIS — I2510 Atherosclerotic heart disease of native coronary artery without angina pectoris: Secondary | ICD-10-CM

## 2024-01-19 DIAGNOSIS — M7989 Other specified soft tissue disorders: Secondary | ICD-10-CM | POA: Diagnosis not present

## 2024-01-19 DIAGNOSIS — I2089 Other forms of angina pectoris: Secondary | ICD-10-CM

## 2024-01-19 DIAGNOSIS — I82611 Acute embolism and thrombosis of superficial veins of right upper extremity: Secondary | ICD-10-CM

## 2024-01-19 DIAGNOSIS — R7309 Other abnormal glucose: Secondary | ICD-10-CM | POA: Diagnosis not present

## 2024-01-19 DIAGNOSIS — R002 Palpitations: Secondary | ICD-10-CM

## 2024-01-19 DIAGNOSIS — I25111 Atherosclerotic heart disease of native coronary artery with angina pectoris with documented spasm: Secondary | ICD-10-CM | POA: Diagnosis not present

## 2024-01-19 DIAGNOSIS — R5381 Other malaise: Secondary | ICD-10-CM

## 2024-01-19 DIAGNOSIS — R5382 Chronic fatigue, unspecified: Secondary | ICD-10-CM

## 2024-01-19 DIAGNOSIS — I201 Angina pectoris with documented spasm: Secondary | ICD-10-CM
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
## 2024-01-19 NOTE — Patient Instructions (Addendum)
 Medication Instructions:  No changes  *If you need a refill on your cardiac medications before your next appointment, please call your pharmacy*   Lab Work: Not needed    Testing/Procedures:  Your physician has requested that you have a lower extremity venous duplex to look for reflux or DVT. This test is an ultrasound of the veins in the legs. It looks at venous blood flow that carries blood from the heart to the legs or arms. Allow one hour for a Lower Venous exam.  There are no restrictions or special instructions.  Please note: We ask at that you not bring children with you during ultrasound (echo/ vascular) testing. Due to room size and safety concerns, children are not allowed in the ultrasound rooms during exams. Our front office staff cannot provide observation of children in our lobby area while testing is being conducted. An adult accompanying a patient to their appointment will only be allowed in the ultrasound room at the discretion of the ultrasound technician under special circumstances. We apologize for any inconvenience.   Follow-Up: At [facility], you and your health needs are our priority.  As part of our continuing mission to provide you with exceptional heart care, we have created designated Provider Care Teams.  These Care Teams include your primary Cardiologist (physician) and Advanced Practice Providers (APPs -  Physician Assistants and Nurse Practitioners) who all work together to provide you with the care you need, when you need it.     Your next appointment:   12 month(s)  The format for your next appointment:   In Person  Provider:   Alm Clay, MD

## 2024-01-21 ENCOUNTER — Ambulatory Visit (HOSPITAL_COMMUNITY)
Admission: RE | Admit: 2024-01-21 | Discharge: 2024-01-21 | Disposition: A | Discharge status: Home / Self Care | Source: Ambulatory Visit | Attending: Cardiology | Admitting: Cardiology

## 2024-01-21 DIAGNOSIS — M7989 Other specified soft tissue disorders: Secondary | ICD-10-CM | POA: Insufficient documentation

## 2024-01-21 DIAGNOSIS — I82611 Acute embolism and thrombosis of superficial veins of right upper extremity: Secondary | ICD-10-CM | POA: Insufficient documentation

## 2024-01-23 ENCOUNTER — Ambulatory Visit: Payer: Self-pay | Admitting: Cardiology

## 2024-01-23 ENCOUNTER — Encounter: Payer: Self-pay | Admitting: Cardiology

## 2024-01-23 DIAGNOSIS — D692 Other nonthrombocytopenic purpura: Secondary | ICD-10-CM | POA: Insufficient documentation

## 2024-01-23 IMAGING — CT CT CHEST W/ CM
2 of 4 series · 15 of 36 positions shown, 18 images · IV contrast (OMNIPAQUE)
Comparison: September 22, 2020.

CLINICAL DATA: Small cell lung cancer follow-up in a 65-year-old
female.

* Tracking Code: BO *
EXAM:
CT CHEST WITH CONTRAST
TECHNIQUE: Multidetector CT imaging of the chest was performed during
intravenous contrast administration.

[Series 2: axial st · axial · 0.73mm/px · z∈[+1381,+1629]mm · 12 of 148 slices shown, 15 images]
[im 12/148  mediastinal]
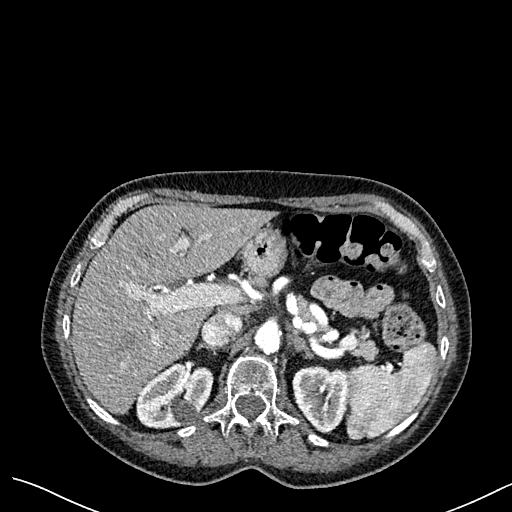
[im 12/148  lung]
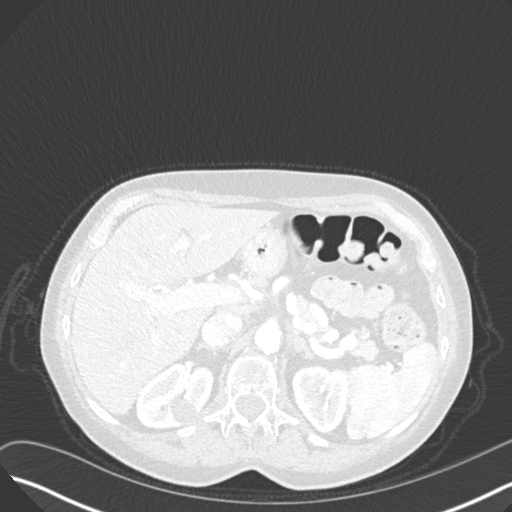
[im 23/148  lung]
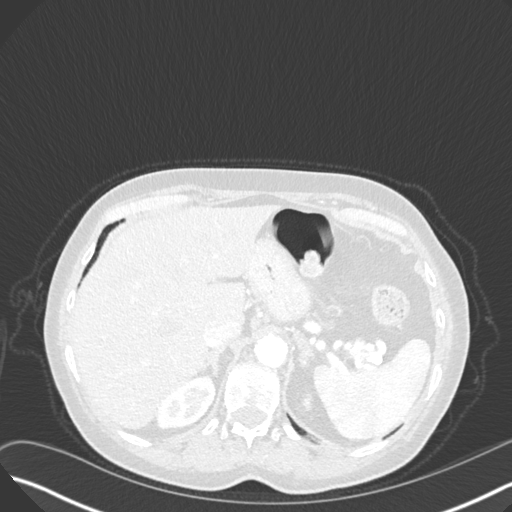
[im 34/148  lung]
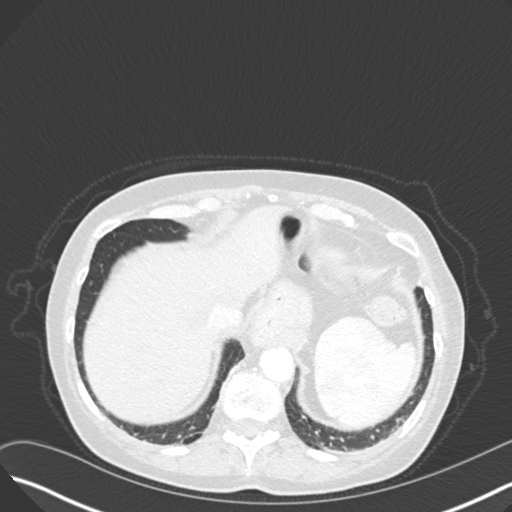
[im 46/148  lung]
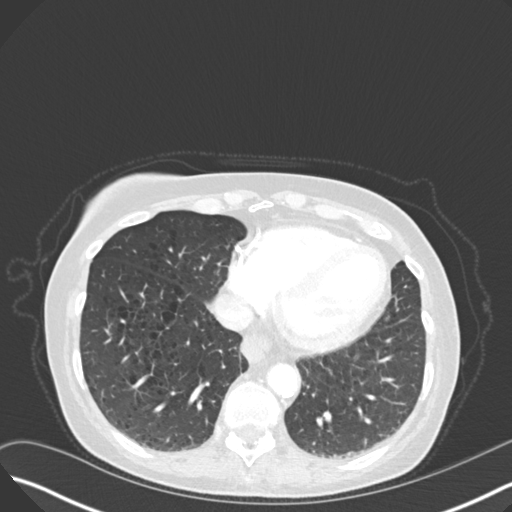
[im 57/148  mediastinal]
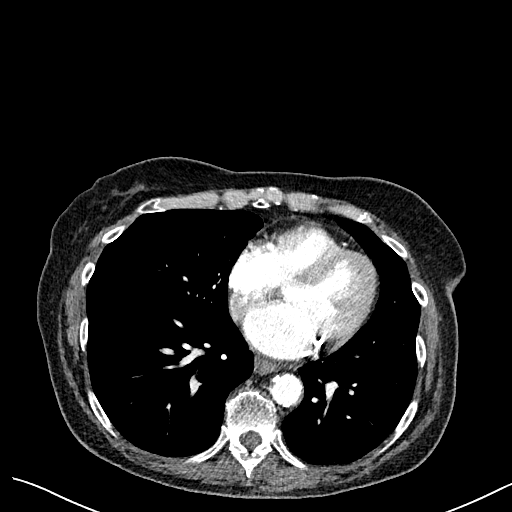
[im 57/148  lung]
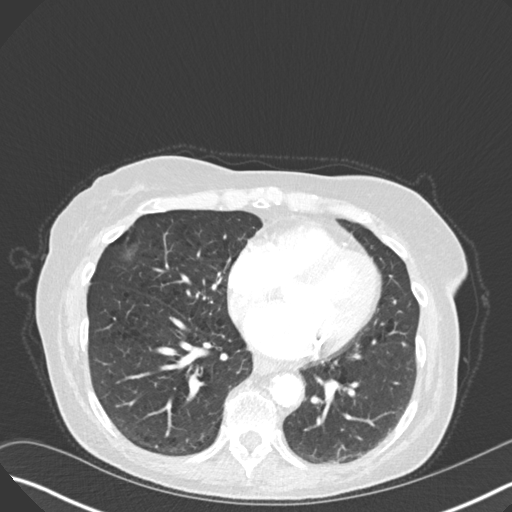
[im 68/148  lung]
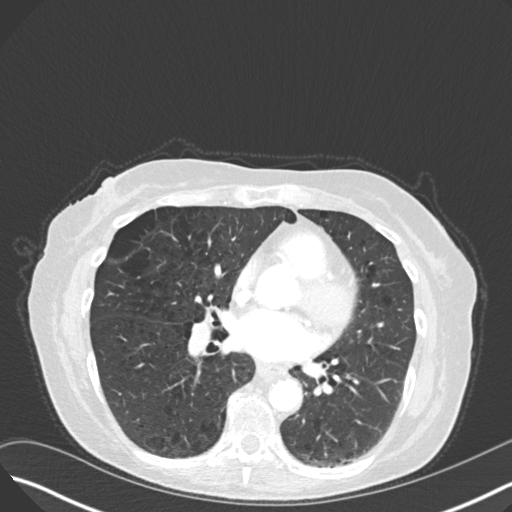
[im 80/148  lung]
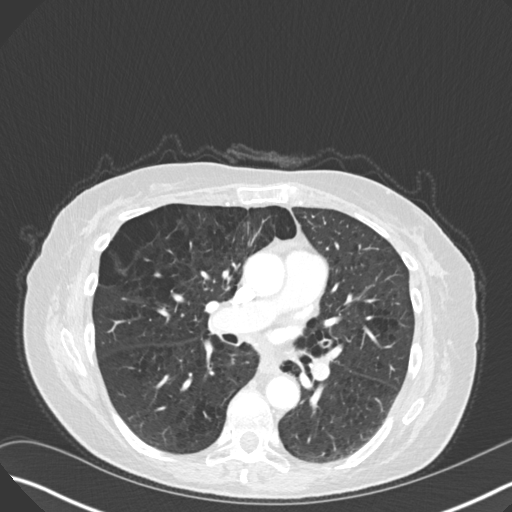
[im 91/148  lung]
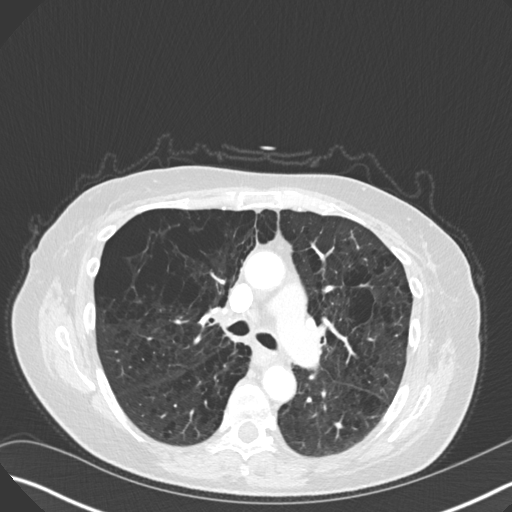
[im 102/148  mediastinal]
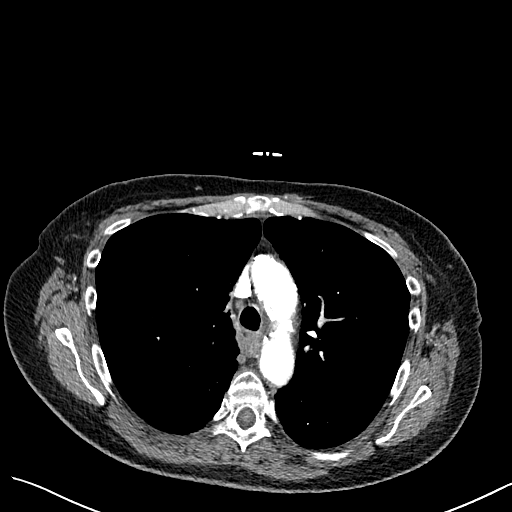
[im 102/148  lung]
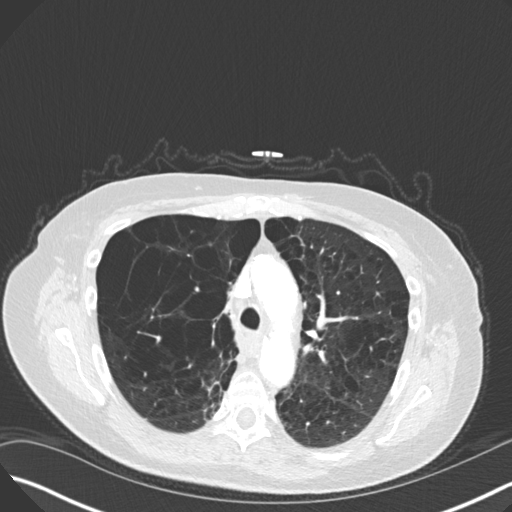
[im 114/148  lung]
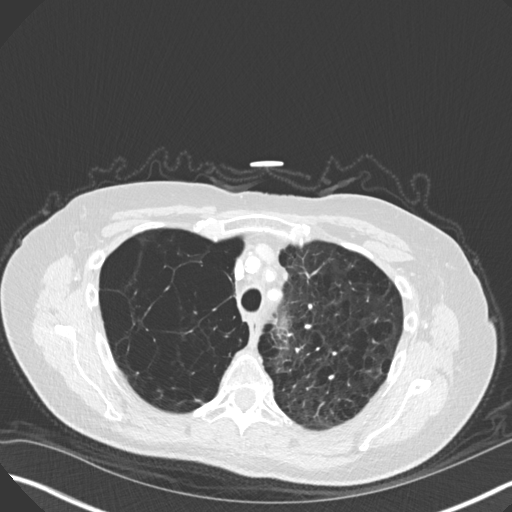
[im 125/148  lung]
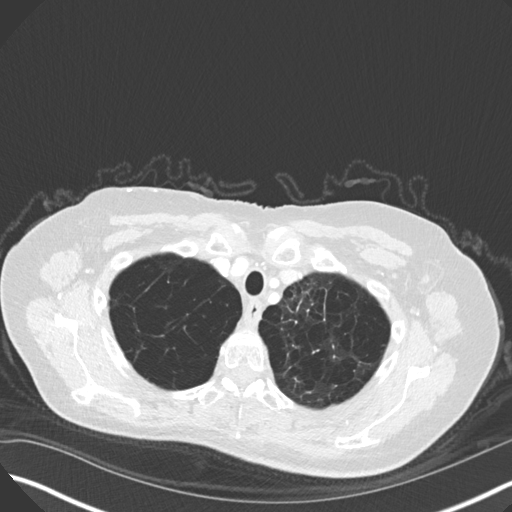
[im 136/148  lung]
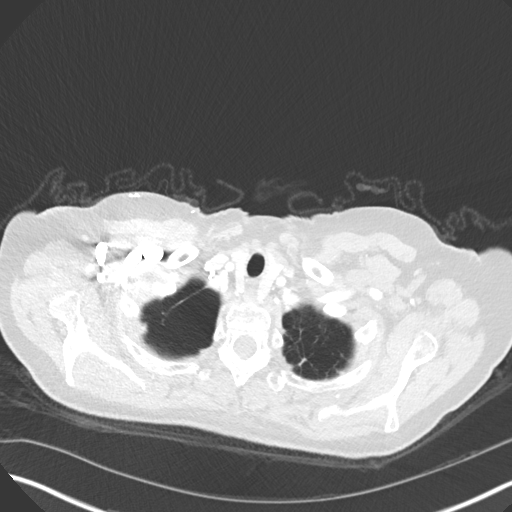

[Series 6: coronal · coronal · 0.68mm/px · 3 of 115 slices shown]
[im 23/115  lung]
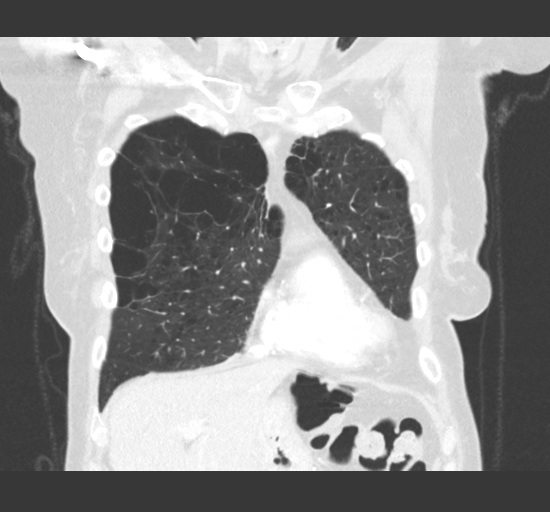
[im 46/115  lung]
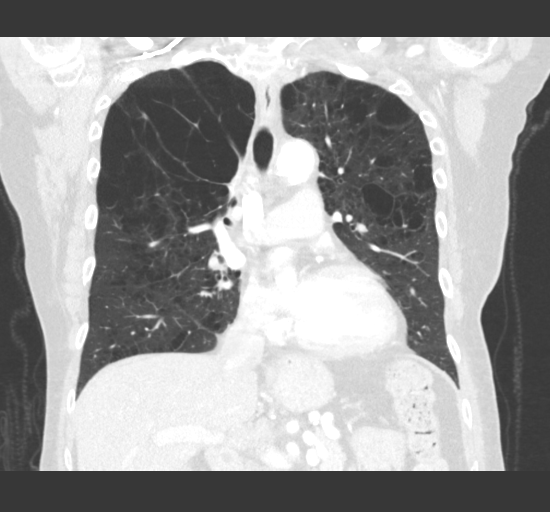
[im 69/115  lung]
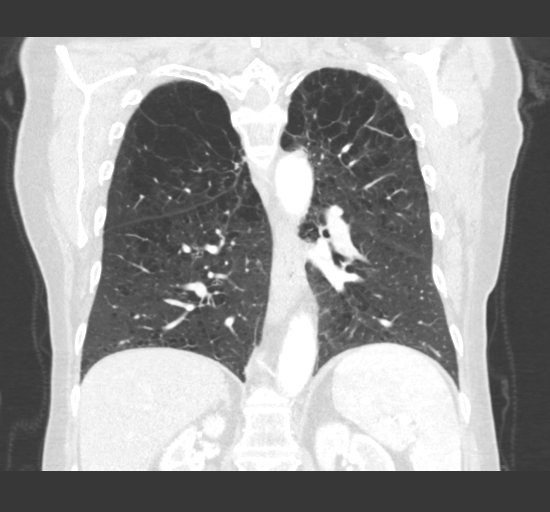

[15 of 36 positions shown; findings below may reference images not displayed]

RADIATION DOSE REDUCTION: This exam was performed according to the
departmental dose-optimization program which includes automated
exposure control, adjustment of the mA and/or kV according to
patient size and/or use of iterative reconstruction technique.

CONTRAST:  75mL OMNIPAQUE IOHEXOL 300 MG/ML  SOLN
FINDINGS: Cardiovascular: Calcified aortic atherosclerotic changes.
Noncalcified plaque as well. No aneurysmal dilation and no change
from previous imaging the appearance of the thoracic aorta. Central
pulmonary vasculature is normal caliber. Heart size is stable
without substantial pericardial effusion. Three-vessel coronary
artery disease.

Mediastinum/Nodes: Small hiatal hernia. Mild esophageal thickening
presumably related to prior radiotherapy. No signs of adenopathy in
the chest. Stable appearance of soft tissue density in the
subcarinal region and along the lower RIGHT paratracheal region
presumably post treatment related changes. No internal mammary
adenopathy.

Lungs/Pleura: Marked pulmonary emphysema that is worse towards the
RIGHT lung apex.

Stable small calcified nodules in the RIGHT upper lobe. No new
suspicious nodule. No consolidation. No sign of effusion.

Upper Abdomen: Small splenic artery aneurysm measuring 6 mm no the
splenic hilum. This is unchanged compared to prior imaging. Small
hiatal hernia as discussed. No acute upper abdominal process.

Musculoskeletal: No acute bone finding. No destructive bone process.
Spinal degenerative changes.
IMPRESSION: 1. Stable post treatment changes in the mediastinum and RIGHT hilum.
No new or progressive findings.
2. Marked pulmonary emphysema that is worse towards the RIGHT lung
apex.
3. Three-vessel coronary artery disease.
4. Small hiatal hernia.
5. Small splenic artery aneurysm measuring 6 mm, unchanged compared
to prior imaging.

Aortic Atherosclerosis (8X3ZT-4VC.C) and Emphysema (8X3ZT-VBA.8).

## 2024-01-23 NOTE — Assessment & Plan Note (Addendum)
 Managed with biweekly injections of Repatha  140 mg, Vascepa  1 mg twice daily, 3 times a week pravastatin  80 mg and co-Q10. Cholesterol levels due for re-evaluation. - Refill/continue Repatha  prescription. - Continue Vascepa  for triglycerides, and max tolerated dose of pravastatin  (80 mg 3 times a week) pleiotropic effects. - Order fasting lipid panel and LFTs

## 2024-01-23 NOTE — Assessment & Plan Note (Signed)
 Palpitations with well-documented history of symptomatic premature beats present, not prolonged.  Multiple monitors that failed to show any severe arrhythmias or prolonged episodes of atrial tachycardia.  Just symptomatic PACs. Previous antiarrhythmic use caused hypotension and dizziness.  Current strategy tolerates palpitations to avoid falls. - Avoid antiarrhythmic medications due to risk of hypotension.

## 2024-01-23 NOTE — Assessment & Plan Note (Signed)
 She is chronically ill but I do think that a lot of this is related to depression.  In the past I tried to get her on medications and she did not want to take anything she just wanted to have as needed Xanax . I defer to PCP to manage.

## 2024-01-23 NOTE — Assessment & Plan Note (Signed)
 She has had issues with mild superficial vein clots in the past. Currently complaining of left lower extremity swelling and pain, rule out venous insufficiency or deep vein thrombosis Swelling and pain with history of hip surgery. Differential includes venous insufficiency or deep vein thrombosis. Low suspicion for DVT, further evaluation needed due to cancer history. - Order Doppler ultrasound of the left leg to assess for venous insufficiency or deep vein thrombosis. - Advise elevation of the legs to reduce swelling.

## 2024-01-23 NOTE — Assessment & Plan Note (Signed)
 Not unexpectedly she may have some chronic angina because of her extensive CAD. On 60 mg Imdur .  Occasionally using as needed nitroglycerin .  Unable to tolerate other medications because of hypotension. We have tried amlodipine  which led to orthostatic hypotension.  She did not tolerate Ranexa  because of multiple different symptoms, and beta-blockers were were not successful due to fatigue and hypotension.  -Continue Imdur  60 mg

## 2024-01-23 NOTE — Assessment & Plan Note (Signed)
 Intolerant of just about any kind of antihypertensive: Calcium  channel blockers, beta-blockers, ARB/ACE inhibitor, clearly not diuretics.

## 2024-01-23 NOTE — Assessment & Plan Note (Signed)
 Prediabetes A1c of 6.0, within prediabetic range. No pharmacological treatment indicated.

## 2024-01-23 NOTE — Assessment & Plan Note (Signed)
 Significant two-vessel disease with previously placed stents patent in the RCA but occluded in the LCx-OM bifurcation. Currently on maintenance dose clopidogrel  75 mg 3 days a week for combination of CAD and PAD On Repatha , intermittent pravastatin  and Vascepa  along with coq.10 for lipids Not on a beta-blocker because of fatigue and history of hypertension.  Unable to tolerate ARB or amlodipine  (we have tried both previously) because of hypotension and orthostasis. => Able to tolerate Imdur . Intermittent sharp multifocal chest discomfort mostly musculoskeletal in nature. In the past we have tried amlodipine  for antianginal treatment and she did not tolerate because of hypotension.  We also had tried Ranexa  which she did not tolerate for other reasons.  Plan: Continue current management Imdur  30 mg daily Plavix  75 mg Monday Wednesday Friday-okay to hold 5 days prior to surgeries or procedures Continue co-Q10 along with Vascepa  1 mg twice daily, pravastatin  80 mg Monday Wednesday and Friday along with Repatha  140 mg every 2 weeks.Michele [name]

## 2024-01-23 NOTE — Assessment & Plan Note (Signed)
 Clear evidence of spasm in the coronaries during previous catheterizations.  Again not able to tolerate amlodipine /diltiazem and therefore on Imdur  60 mg daily.

## 2024-01-23 NOTE — Assessment & Plan Note (Addendum)
 Euvolemic on exam.  She is not having PND or orthopnea.  Normal EF on echocardiogram with no real diastolic abnormality noted beyond expected GR 1 DD.SABRA Trivial lower extremity edema with discomfort is not likely related to CHF . In the past we tried intermittent Lasix  but but because of her orthostasis issues again not able to use.

## 2024-01-23 NOTE — Assessment & Plan Note (Signed)
 Several different MIs some of been true STEMI's NSTEMI been non-STEMI's.   Her most recent cardiac catheterization for non-STEMI was in September 2015 where she had now basically occluded LCx-LM bifurcation felt to be not favorable for redo intervention.  RCA stents are patent. Despite having occluded circumflex system, she has had nonischemic Myoview 's and normal echocardiograms.
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
## 2024-01-23 NOTE — Assessment & Plan Note (Signed)
 Easy bruising secondary to antiplatelet therapy Bruising likely secondary to Plavix  use, not dangerous but cosmetically concerning.  Plavix  preferred over aspirin  due to coronary artery disease history. - Continue Plavix  75 mg therapy which has been reduced to 3 times a week (Monday Wednesday Friday).

## 2024-01-23 NOTE — Assessment & Plan Note (Signed)
 Check lower extremity Dopplers to exclude DVT or superficial thrombosis.

## 2024-01-26 DIAGNOSIS — Z9861 Coronary angioplasty status: Secondary | ICD-10-CM | POA: Diagnosis not present

## 2024-01-26 DIAGNOSIS — I201 Angina pectoris with documented spasm: Secondary | ICD-10-CM | POA: Diagnosis not present

## 2024-01-26 DIAGNOSIS — I2119 ST elevation (STEMI) myocardial infarction involving other coronary artery of inferior wall: Secondary | ICD-10-CM | POA: Diagnosis not present

## 2024-01-26 DIAGNOSIS — I2510 Atherosclerotic heart disease of native coronary artery without angina pectoris: Secondary | ICD-10-CM | POA: Diagnosis not present

## 2024-01-26 DIAGNOSIS — I951 Orthostatic hypotension: Secondary | ICD-10-CM | POA: Diagnosis not present

## 2024-01-27 LAB — LIPID PANEL
Chol/HDL Ratio: 2.3 ratio (ref 0.0–4.4)
Cholesterol, Total: 103 mg/dL (ref 100–199)
HDL: 45 mg/dL (ref >39)
LDL Chol Calc (NIH): 41 mg/dL (ref 0–99)
Triglycerides: 83 mg/dL (ref 0–149)
VLDL Cholesterol Cal: 17 mg/dL (ref 5–40)

## 2024-01-27 LAB — HEPATIC FUNCTION PANEL
ALT: 8 IU/L (ref 0–32)
AST: 15 IU/L (ref 0–40)
Albumin: 4.2 g/dL (ref 3.9–4.9)
Alkaline Phosphatase: 67 IU/L (ref 44–121)
Bilirubin Total: 0.3 mg/dL (ref 0.0–1.2)
Bilirubin, Direct: 0.1 mg/dL (ref 0.00–0.40)
Total Protein: 6.1 g/dL (ref 6.0–8.5)

## 2024-01-27 LAB — CBC
Hematocrit: 45.8 % (ref 34.0–46.6)
Hemoglobin: 14.9 g/dL (ref 11.1–15.9)
MCH: 30.5 pg (ref 26.6–33.0)
MCHC: 32.5 g/dL (ref 31.5–35.7)
MCV: 94 fL (ref 79–97)
Platelets: 200 x10E3/uL (ref 150–450)
RBC: 4.89 x10E6/uL (ref 3.77–5.28)
RDW: 12.8 % (ref 11.7–15.4)
WBC: 7.6 x10E3/uL (ref 3.4–10.8)

## 2024-02-04 ENCOUNTER — Other Ambulatory Visit: Payer: Self-pay | Admitting: Emergency Medicine

## 2024-02-04 ENCOUNTER — Ambulatory Visit: Payer: Self-pay | Admitting: Emergency Medicine

## 2024-02-04 NOTE — Telephone Encounter (Unsigned)
 Copied from CRM #[id number]. Topic: Clinical - Medication Refill >> Feb 04, 2024  4:29 PM Abigail D wrote: Medication: SPIRIVA  HANDIHALER 18 MCG inhalation capsule [[id number]] ARNUITY ELLIPTA  100 MCG/ACT AEPB [[id number]] (prescribed by Dr. Jude)  Has the patient contacted their pharmacy? No (Agent: If no, request that the patient contact the pharmacy for the refill. If patient does not wish to contact the pharmacy document the reason why and proceed with request.) (Agent: If yes, when and what did the pharmacy advise?)  This is the patient's preferred pharmacy:  [id number] [place], [place] - [address] AT [facility] & [address] [place] [address] Phone: [phone] Fax: [phone]  Is this the correct pharmacy for this prescription? Yes If no, delete pharmacy and type the correct one.   Has the prescription been filled recently? No  Is the patient out of the medication? Yes  Has the patient been seen for an appointment in the last year OR does the patient have an upcoming appointment? Yes  Can we respond through MyChart? Yes  Agent: Please be advised that Rx refills may take up to 3 business days. We ask that you follow-up with your pharmacy.

## 2024-02-04 NOTE — Telephone Encounter (Signed)
 FYI Only or Action Required?: FYI only for provider.  Patient is followed in Pulmonology for COPD, last seen on 11/14/2023 by Malachy Comer GAILS, NP.  Called Nurse Triage reporting Hemoptysis.  Symptoms began yesterday.  Interventions attempted: Nothing.  Symptoms are: unchanged.  Triage Disposition: See PCP When Office is Open (Within 3 Days)  Patient/caregiver understands and will follow disposition?: Yes        Copied from CRM #[id number]. Topic: Clinical - Red Word Triage >> Feb 04, 2024  4:36 PM Lavanda D wrote: Red Word that prompted transfer to Nurse Triage: Patient spit up blood this morning when she coughed.        Reason for Disposition  [1] More than one episode of coughing up blood AND [2] < 1 tablespoon (15 ml) of pure red blood  Answer Assessment - Initial Assessment Questions 1. ONSET: When did the cough begin?      Chronic cough  2. SEVERITY: How bad is the cough today? Did the blood appear after a coughing spell?      Blood after coughing yesterday  3. SPUTUM: Describe the color of your sputum (e.g., none, dry cough; clear, white, yellow, green)     White  4. HEMOPTYSIS: How much blood? (e.g., flecks, streaks, tablespoons)     Around a teaspoon to tablespoon yesterday, none today  5. DIFFICULTY BREATHING: Are you having difficulty breathing? If Yes, ask: How bad is it? (e.g., mild, moderate, severe)      No 6. FEVER: Do you have a fever? If Yes, ask: What is your temperature, how was it measured, and when did it start?     No 7. CARDIAC HISTORY: Do you have any history of heart disease? (e.g., heart attack, congestive heart failure)      Yes 8. LUNG HISTORY: Do you have any history of lung disease?  (e.g., pulmonary embolus, asthma, emphysema)     COPD 9. PE RISK FACTORS: Do you have a history of blood clots? Note: Other risk factors include recent major surgery, recent prolonged travel, being bedridden.     No 10. OTHER  SYMPTOMS: Do you have any other symptoms? (e.g., runny nose, wheezing, chest pain)       Right lymph node swelling  Protocols used: Coughing Up Blood-A-AH

## 2024-02-04 NOTE — Telephone Encounter (Signed)
 Nfn appt scheduled

## 2024-02-05 ENCOUNTER — Ambulatory Visit (INDEPENDENT_AMBULATORY_CARE_PROVIDER_SITE_OTHER): Admitting: Adult Health

## 2024-02-05 ENCOUNTER — Ambulatory Visit (INDEPENDENT_AMBULATORY_CARE_PROVIDER_SITE_OTHER)

## 2024-02-05 ENCOUNTER — Encounter: Payer: Self-pay | Admitting: Adult Health

## 2024-02-05 ENCOUNTER — Ambulatory Visit: Payer: Self-pay | Admitting: Adult Health

## 2024-02-05 VITALS — BP 124/68 | HR 68 | Temp 97.9°F | Ht 64.0 in | Wt 136.4 lb

## 2024-02-05 DIAGNOSIS — J432 Centrilobular emphysema: Secondary | ICD-10-CM

## 2024-02-05 DIAGNOSIS — R042 Hemoptysis: Secondary | ICD-10-CM

## 2024-02-05 DIAGNOSIS — Z85118 Personal history of other malignant neoplasm of bronchus and lung: Secondary | ICD-10-CM | POA: Diagnosis not present

## 2024-02-05 DIAGNOSIS — J439 Emphysema, unspecified: Secondary | ICD-10-CM | POA: Diagnosis not present

## 2024-02-05 DIAGNOSIS — I700 Atherosclerosis of aorta: Secondary | ICD-10-CM | POA: Diagnosis not present

## 2024-02-05 DIAGNOSIS — C3491 Malignant neoplasm of unspecified part of right bronchus or lung: Secondary | ICD-10-CM

## 2024-02-05 LAB — CBC WITH DIFFERENTIAL/PLATELET
Basophils Absolute: 0 K/uL (ref 0.0–0.1)
Basophils Relative: 0.5 % (ref 0.0–3.0)
Eosinophils Absolute: 0.1 K/uL (ref 0.0–0.7)
Eosinophils Relative: 1 % (ref 0.0–5.0)
HCT: 43.8 % (ref 36.0–46.0)
Hemoglobin: 14.9 g/dL (ref 12.0–15.0)
Lymphocytes Relative: 11.2 % — ABNORMAL LOW (ref 12.0–46.0)
Lymphs Abs: 0.6 K/uL — ABNORMAL LOW (ref 0.7–4.0)
MCHC: 33.9 g/dL (ref 30.0–36.0)
MCV: 90.6 fl (ref 78.0–100.0)
Monocytes Absolute: 0.3 K/uL (ref 0.1–1.0)
Monocytes Relative: 6.1 % (ref 3.0–12.0)
Neutro Abs: 4.6 K/uL (ref 1.4–7.7)
Neutrophils Relative %: 81.2 % — ABNORMAL HIGH (ref 43.0–77.0)
Platelets: 163 K/uL (ref 150.0–400.0)
RBC: 4.83 Mil/uL (ref 3.87–5.11)
RDW: 13.9 % (ref 11.5–15.5)
WBC: 5.7 K/uL (ref 4.0–10.5)

## 2024-02-05 LAB — D-DIMER, QUANTITATIVE: D-Dimer, Quant: 1.57 ug{FEU}/mL — ABNORMAL HIGH (ref <0.50)

## 2024-02-05 MED ORDER — AZITHROMYCIN 250 MG PO TABS: ORAL_TABLET | ORAL | 0 refills | Status: AC

## 2024-02-05 NOTE — Patient Instructions (Addendum)
 Continue Arnuity 1 puff daily. Brush tongue and rinse mouth afterwards Continue Spiriva  2 puffs daily Xopenex  inhaler or neb As needed   Chest xray today.  Order for neb machine.  Zpack take as directed  Liquid Mucinex  DM Twice daily  As needed  cough/congestion  Labs today.  Follow up with Dr. Shelah in 2-3 months and As needed   If blood returns will need to call back sooner or go to ER.  Please contact office for sooner follow up if symptoms do not improve or worsen or seek emergency care
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
## 2024-02-05 NOTE — Progress Notes (Signed)
 ATC patient x1.  Left detailed message letting her know that her lab was elevated, a CT scan was ordered by Tammy to rule out a blood clot and to listen out for a call from one of our PCCs to get the CT scan scheduled.  Advised to call back with any questions.

## 2024-02-06 ENCOUNTER — Ambulatory Visit: Payer: Self-pay | Admitting: Adult Health

## 2024-02-06 ENCOUNTER — Telehealth: Payer: Self-pay | Admitting: Cardiology

## 2024-02-06 ENCOUNTER — Ambulatory Visit
Admission: RE | Admit: 2024-02-06 | Discharge: 2024-02-06 | Disposition: A | Discharge status: Home / Self Care | Source: Ambulatory Visit | Attending: Adult Health | Admitting: Adult Health

## 2024-02-06 DIAGNOSIS — C3491 Malignant neoplasm of unspecified part of right bronchus or lung: Secondary | ICD-10-CM

## 2024-02-06 DIAGNOSIS — R042 Hemoptysis: Secondary | ICD-10-CM

## 2024-02-06 DIAGNOSIS — J439 Emphysema, unspecified: Secondary | ICD-10-CM | POA: Diagnosis not present

## 2024-02-06 MED ORDER — ARNUITY ELLIPTA 100 MCG/ACT IN AEPB: 1.0000 | INHALATION_SPRAY | Freq: Every day | RESPIRATORY_TRACT | 5 refills | Status: DC

## 2024-02-06 MED ORDER — IOPAMIDOL (ISOVUE-370) INJECTION 76%
75.0000 mL | Freq: Once | INTRAVENOUS | Status: AC | PRN
  Administered 2024-02-06: 75 mL via INTRAVENOUS

## 2024-02-06 MED ORDER — CLOPIDOGREL BISULFATE 75 MG PO TABS: ORAL_TABLET | ORAL | 3 refills | Status: AC

## 2024-02-06 MED ORDER — SPIRIVA HANDIHALER 18 MCG IN CAPS: 18.0000 ug | ORAL_CAPSULE | Freq: Every day | RESPIRATORY_TRACT | 11 refills | Status: DC

## 2024-02-06 NOTE — Telephone Encounter (Signed)
 FYI Only or Action Required?: FYI only for provider.  Patient is followed in Pulmonology for COPD, last seen on 02/05/2024 by Parrett, Michele RAMAN, NP.  Called Nurse Triage reporting Medication Question.  Triage Disposition: Information or Advice Only Call  Patient/caregiver understands and will follow disposition?: Yes           Copied from CRM #[id number]. Topic: Clinical - Red Word Triage >> Feb 06, 2024  4:35 PM Michele [name] wrote: Red Word that prompted transfer to Nurse Triage: Clinic is closed; don't believe this qualifies as a red flag word, but I still wanted to at least have the pt talk to someone with clinical knowledge.   Pt was seen 9/4 by NP Tammy Parrett and was prescribed azithromycin  (ZITHROMAX  Z-PAK) 250 MG tablet [[id number]]. She stated she picked up the medication, read the pamphlet on the inside of the box with a side effect listed as possible death. She is having concerns on taking this medication, and wanted the advice from someone in clinical, but the clinic is closed at this time.       Reason for Disposition  Caller has medicine question only, adult not sick, AND triager answers question    Patient calling about recently prescribed Zithromax   Answer Assessment - Initial Assessment Questions I have discussed the patient's concerns with her and advised her that if she had any further questions about the safety of the medication that she should contact a pharmacist who would be able to answer her questions.       1. NAME of MEDICINE: What medicine(s) are you calling about?     Zithromax   2. QUESTION: What is your question? (e.g., double dose of medicine, side effect)     Concerned about possible side effects such as possible death 3. PRESCRIBER: Who prescribed the medicine? Reason: if prescribed by specialist, call should be referred to that group.     Michele Stank, NP  Protocols used: Medication Question Call-A-AH

## 2024-02-06 NOTE — Telephone Encounter (Signed)
 Copied from CRM [id number]. Topic: General - Other >> Feb 05, 2024  4:45 PM Celestine FALCON wrote: Reason for CRM: Hadassah from [employer] called stating someone from the clinic named Malayasia or Nat called to schedule the pt for a Stat Ct, and the ordering provider with NP Tammy Parrett. I tried to call CAL, but was told to send a CRM. I reminded that Hadassah said it was stat and urgent so she tried to get a CMA on the line. In the middle of doing so, Hadassah d/c the call. CAL stated if she calls back to call CAL. >> Feb 06, 2024  9:38 AM [name] wrote: Patient returning missed phone call - no message states it may be in regard to her CT appnt today.

## 2024-02-06 NOTE — Telephone Encounter (Signed)
 [name] medication was sent to pt's pharmacy as requested. Confirmation received.

## 2024-02-06 NOTE — Telephone Encounter (Signed)
*  STAT* If patient is at the pharmacy, call can be transferred to refill team.   1. Which medications need to be refilled? (please list name of each medication and dose if known) ALPRAZolam  (XANAX ) 1 MG tablet  clopidogrel  (PLAVIX ) 75 MG tablet     2. Would you like to learn more about the convenience, safety, & potential cost savings by using the [facility]?    3. Are you open to using the [facility] (Type [facility].  ).   4. Which pharmacy/location (including street and city if local pharmacy) is medication to be sent to? [id number] - [place], [place] - [address] AT [facility] [address]    5. Do they need a 30 day or 90 day supply? 90 day

## 2024-02-09 ENCOUNTER — Ambulatory Visit: Payer: Self-pay | Admitting: Family Medicine

## 2024-02-09 ENCOUNTER — Ambulatory Visit: Payer: Self-pay | Admitting: Adult Health

## 2024-02-09 DIAGNOSIS — J471 Bronchiectasis with (acute) exacerbation: Secondary | ICD-10-CM | POA: Diagnosis not present

## 2024-02-09 NOTE — Telephone Encounter (Signed)
 CT angio completed on 9/5.  Nothing further needed.

## 2024-02-09 NOTE — Telephone Encounter (Signed)
 I called the pt and there was no answer- LMTCB. ?

## 2024-02-09 NOTE — Telephone Encounter (Signed)
 FYI Only or Action Required?: Action required by provider: call patient.  Patient is followed in Pulmonology for COPD, last seen on 02/05/2024 by Parrett, Michele RAMAN, NP.  Called Nurse Triage reporting Advice Only.  Triage Disposition: Call PCP When Office is Open  Patient/caregiver understands and will follow disposition?: Yes           Copied from CRM [id number]. Topic: Clinical - Red Word Triage >> Feb 09, 2024  2:27 PM Nathanel DEL wrote: Red Word that prompted transfer to Nurse Triage: Copied from CRM #[id number]. Topic: Clinical - Red Word Triage >> Feb 06, 2024  4:35 PM Celestine FALCON wrote: >> Feb 09, 2024  2:51 PM Leila BROCKS wrote: Patient [phone] states got disconnect from the nurse right now. Patient states she needs help now. Warm transferred to NT.  Reason for Disposition  [1] Caller requesting NON-URGENT health information AND [2] PCP's office is the best resource  Answer Assessment - Initial Assessment Questions Pt states Parrett, NP, prescribed OTC liquid mucinex  bid for congestion and cough. Pt was only able to find mucinex  fast max dm. Pt states she was unable to find the liquid Mucinex .  Upon speaking with pt, another nurse states the office tried to call pt at 2:53 PM. This RN put pt on hold and attempted to contact CAL twice with no answer. This RN went back to call with pt and pt did not answer. This RN made attempt to contact pt and left a voicemail with call back number provided.  Protocols used: Information Only Call - No Triage-A-AH

## 2024-02-09 NOTE — Telephone Encounter (Signed)
 It is fine to take mucinex  liquid fast max. Can follow bottle directions for use.  She can stop Zpack if she does not feel comfortable taking. She has taken in past with no issue. Feel that this drug is safe drug for most patients.  CT results are in the chart and sent to her last week, can review in detail with her.  If not feeling better can make ov with Dr. Byrum for evaluation  Please contact office for sooner follow up if symptoms do not improve or worsen or seek emergency care

## 2024-02-09 NOTE — Telephone Encounter (Signed)
 Duplicated encounter. NFN

## 2024-02-09 NOTE — Telephone Encounter (Signed)
 Nurse accepted transfer, nurse greeted pt. Pt disconnected. Attempted to contact pt, no ringing, no answer. LVM informing her that one of the pulm office nurses will call her back since all were unavailable, also advised that pt call back on different number or let us  know if there is a better number to call since she stated she was having phone troubles.  Routing to pulm office for further outreach.     Copied from CRM [id number]. Topic: Clinical - Red Word Triage >> Feb 09, 2024  2:27 PM Nathanel DEL wrote: Red Word that prompted transfer to Nurse Triage: Copied from CRM #[id number]. Topic: Clinical - Red Word Triage >> Feb 06, 2024  4:35 PM Celestine F wrote:  Reason for CRM: Pt returning RN Heather's call, but CAL did not answer when I called 2x. Please call the pt back at [phone].      Per PAS, no answer from CAL. Per pt chart, pt called originally for insight from doc about medication that has side effect of possible death, for which pt is concerned. Messaged pulm team through Teams chat, office team advised sending office message for call back from another office member, all on phones at this time, Powell PEAK out of office. PAS informing pt she will receive a call back. PAS called back and this nurse received transfer. Per PAS, pt stating her phone not working right, asking questions about labs, wants to speak with someone now.

## 2024-02-09 NOTE — Progress Notes (Signed)
ATC patient x2.  LVM to return call. °

## 2024-02-09 NOTE — Telephone Encounter (Signed)
 I called and spoke with the pt  She has reviewed her recent labs and had questions about why some values were abnormal    Neutrophils Relative % 81.2 High   77 R   86 R   Lymphocytes Relative 11.2 Low           She is also asking for results of her CTA done on 02/06/24  She states that during ov with TP she was advised to get liquid mucinex  dm and take this bid prn  She was unable to find this at the pharmacy and instead she bought the fast max mucinex  liquid, not DM and instructions state every 4 hours as needed  She is concerned that she got the wrong drug and has not taken any at all  She is still coughing up some white sputum and says that her nose  runs all the time  Lastly, she states that she has taken all of the zithromax  rx except one dose, and she is worried bc the package insert told her it could cause death  She has taken this multiple times in the past  Tammy, please advise thanks

## 2024-02-09 NOTE — Telephone Encounter (Signed)
 I called and spoke to pt. Pt informed of Tammy's note and verbalized understanding. NFN

## 2024-02-09 NOTE — Telephone Encounter (Signed)
ATC x1.  LVM to return call. 

## 2024-02-10 ENCOUNTER — Other Ambulatory Visit: Payer: Self-pay | Admitting: Cardiology

## 2024-02-12 ENCOUNTER — Telehealth: Payer: Self-pay

## 2024-02-12 ENCOUNTER — Encounter: Payer: Self-pay | Admitting: Family Medicine

## 2024-02-12 ENCOUNTER — Ambulatory Visit: Payer: Self-pay

## 2024-02-12 NOTE — Telephone Encounter (Signed)
 Copied from CRM #[id number]. Topic: Clinical - Medication Question >> Feb 12, 2024  2:09 PM [name] wrote: Reason for CRM: Patient wants to speak with nurse about Mucinex  liquid and cannot find the products and asked if she can take Mucinex  DM pills? >> Feb 12, 2024  3:17 PM [name] wrote: Patient states she missed a call from [facility] and is requesting a return call but states she does not know if its necessary as she states she was advised to take the liquid medication. Please call patient back at [phone]  I called and spoke to pt. Pt states she was already called and told this was fine. NFN

## 2024-02-12 NOTE — Telephone Encounter (Signed)
Yes tablets are fine

## 2024-02-12 NOTE — Telephone Encounter (Signed)
 FYI Only or Action Required?: Action required by provider: clinical question for provider.  Patient is followed in Pulmonology for COPD, Bronchiectasis, last seen on 02/05/2024 by Parrett, Madelin RAMAN, NP.  Called Nurse Triage reporting No chief complaint on file..  Symptoms began several days ago.  Interventions attempted: OTC medications: Mucinex  DM.  Symptoms are: gradually worsening.  Triage Disposition: Information or Advice Only Call  Patient/caregiver understands and will follow disposition?: Yes  Copied from CRM #[id number]. Topic: Clinical - Medication Question >> Feb 12, 2024  2:09 PM Michele [name] wrote: Reason for CRM: Patient wants to speak with nurse about Mucinex  liquid and cannot find the products and asked if she can take Mucinex  DM pills? Reason for Disposition  Health information question, no triage required and triager able to answer question  Answer Assessment - Initial Assessment Questions 1. REASON FOR CALL: What is the main reason for your call? or How can I best help you?     Can I take this Mucinex  DM?  2. SYMPTOMS : Do you have any symptoms?      None new.  3. OTHER QUESTIONS: Do you have any other questions?     No  Protocols used: Information Only Call - No Triage-[name]-AH

## 2024-02-12 NOTE — Telephone Encounter (Signed)
 Lm for patient.  Tammy, please advise if okay for patient to take tablets vs liquid, as she can not find liquid mucinex ?

## 2024-02-13 NOTE — Telephone Encounter (Signed)
 Spoke with patient regarding prior message. Patient was able to find the Liquid of mucinex . Nothing else further needed.

## 2024-02-14 ENCOUNTER — Telehealth: Payer: Self-pay | Admitting: Physician Assistant

## 2024-02-14 ENCOUNTER — Other Ambulatory Visit: Payer: Self-pay | Admitting: Physician Assistant

## 2024-02-14 NOTE — Telephone Encounter (Signed)
    Returned patient phone call as requested via pager from med assistance.  Spoke with patient and her friend Debby Manila.  Reported that patient was unable to receive Vascepa  from pharmacy for unclear reason.  Per chart review, Vascepa  was prescribed in 08/2023 with 1 year supply and was last filled on 01/25/2024.  Informed patient that Vascepa  was written for 1 year supply.  Discussed that  it could have been too early for Vascepa  to be filled. Recommended to follow-up with pharmacy to verify reason for medication being denied.    Signed, Lorette CINDERELLA Kapur, PA-C 02/14/2024, 2:41 PM Pager: [phone]

## 2024-02-16 ENCOUNTER — Other Ambulatory Visit: Payer: Self-pay | Admitting: Family Medicine

## 2024-02-16 DIAGNOSIS — N644 Mastodynia: Secondary | ICD-10-CM

## 2024-02-20 ENCOUNTER — Ambulatory Visit: Payer: Self-pay | Admitting: Emergency Medicine

## 2024-02-20 NOTE — Telephone Encounter (Signed)
 FYI Only or Action Required?: Action required by provider: clinical question for provider.  Patient is followed in Pulmonology for COPD, last seen on 02/05/2024 by Parrett, Madelin RAMAN, NP.  Called Nurse Triage reporting Medication Question.  Triage Disposition: Call Pharmacist Within 24 Hours  Patient/caregiver understands and will follow disposition?: Yes      Copied from CRM [id number]. Topic: Clinical - Red Word Triage >> Feb 20, 2024  1:26 PM [name] wrote: [facility] that prompted transfer to Nurse Triage: Patient states she's been coughing a lot and theres been a little bit of blood in it.       Reason for Disposition  [1] Caller has medicine question about med NOT prescribed by primary care doctor (or NP/PA) or specialist AND [2] triager unable to answer question (e.g., compatibility with other med, storage)  Answer Assessment - Initial Assessment Questions Patient has tablets of Mucinex  DM and wanted to know if she is okay to take those, and wanted to know how long she can take the Mucinex  DM for. Patient would like a call at [phone] with a  response to this question. Please advise.    1. NAME of MEDICINE: What medicine(s) are you calling about?     Mucinex  DM 2. QUESTION: What is your question? (e.g., double dose of medicine, side effect)     Is it okay to take the tablet of Mucinex  DM, the liquid hasn't been helping  3. SYMPTOMS: Do you have any symptoms? If Yes, ask: What symptoms are you having?  How bad are the symptoms (e.g., mild, moderate, severe)     Mild cough, sore throat from coughing, specks of blood from cough noted  Protocols used: Medication Question Call-A-AH

## 2024-02-21 IMAGING — MR MR HEAD WO/W CM
13 series · 48 of 48 positions shown · IV contrast (gadavist)
Comparison: 04/06/2021

CLINICAL DATA: History of lung cancer with prophylactic whole-brain
radiotherapy.

EXAM:
MRI HEAD WITHOUT AND WITH CONTRAST
TECHNIQUE: Multiplanar, multiecho pulse sequences of the brain and surrounding
structures were obtained without and with intravenous contrast.
CONTRAST:  6mL GADAVIST GADOBUTROL 1 MMOL/ML IV SOLN

[Series 5: DWI · axial · 3.0mm · 1.36mm/px · z∈[+0,+149]mm · 7 of 104 slices shown (1 of 2)]
[im 1/104]
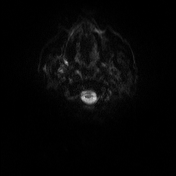
[im 18/104]
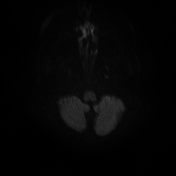
[im 35/104]
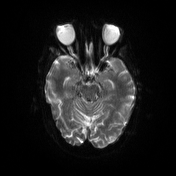
[im 52/104]
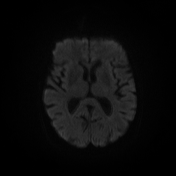
[im 69/104]
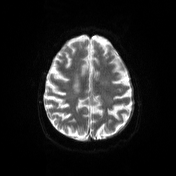
[im 86/104]
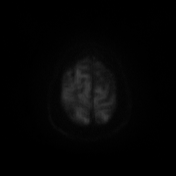
[im 104/104]
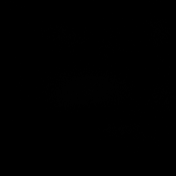

[Series 6: DWI · axial · 3.0mm · 1.36mm/px · z∈[+0,+146]mm · 4 of 51 slices shown (2 of 2)]
[im 1/51]
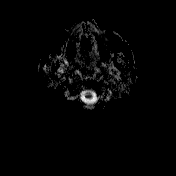
[im 17/51]
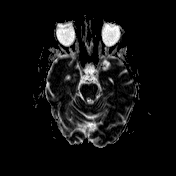
[im 34/51]
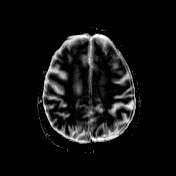
[im 51/51]
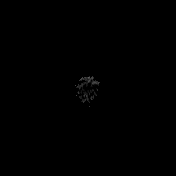

[Series 7: T1 · sagittal · 5.0mm · 0.75mm/px · 1 of 24 slices shown (1 of 2)]
[im 1/24]
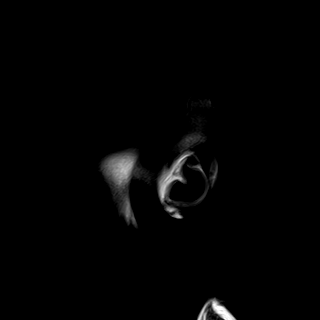

[Series 8: swi_images · axial · 3.0mm · 0.75mm/px · z∈[-4,+156]mm · 3 of 56 slices shown]
[im 1/56]
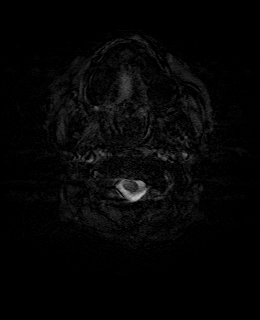
[im 28/56]
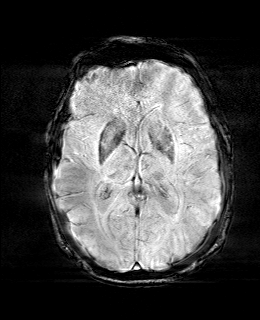
[im 56/56]
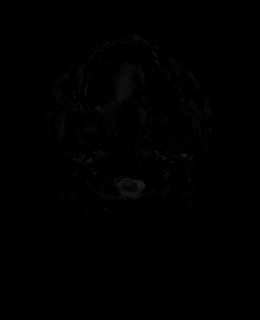

[Series 10: T2 · axial · 5.0mm · 0.62mm/px · 1 of 24 slices shown]
[im 1/24]
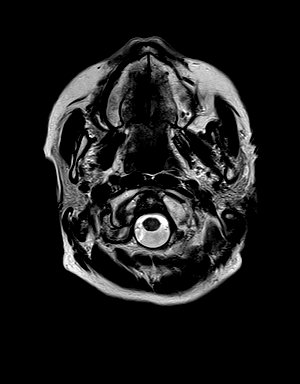

[Series 11: FLAIR · axial · 3.0mm · 0.75mm/px · z∈[+2,+150]mm · 3 of 52 slices shown]
[im 1/52]
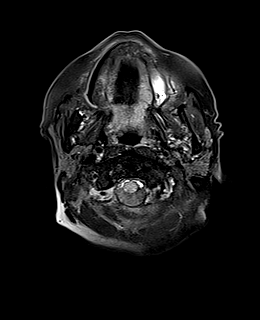
[im 26/52]
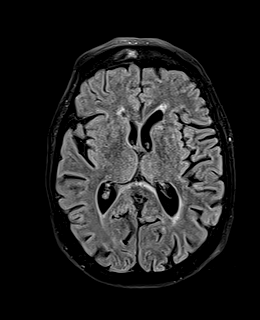
[im 52/52]
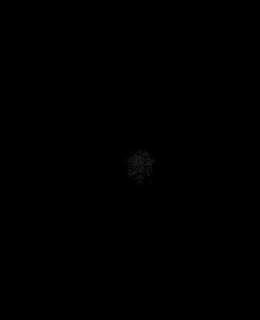

[Series 12: T1 · axial · 1.0mm · 0.94mm/px · z∈[-1,+153]mm · 10 of 160 slices shown (2 of 2)]
[im 1/160]
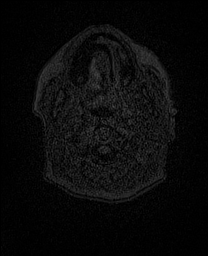
[im 18/160]
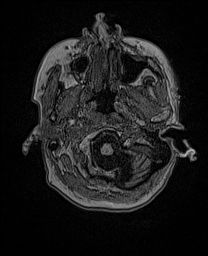
[im 36/160]
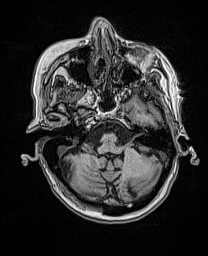
[im 54/160]
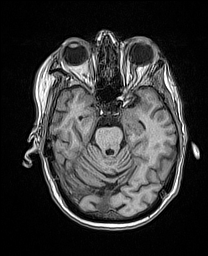
[im 71/160]
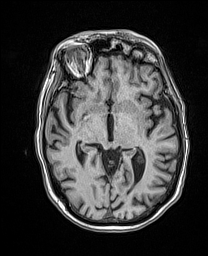
[im 89/160]
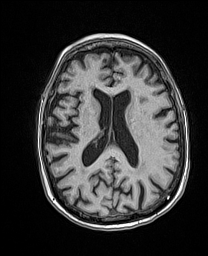
[im 107/160]
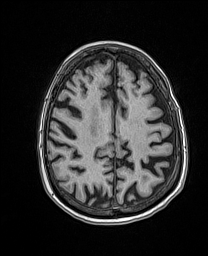
[im 124/160]
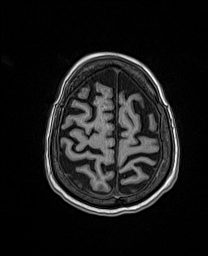
[im 142/160]
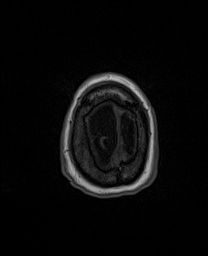
[im 160/160]
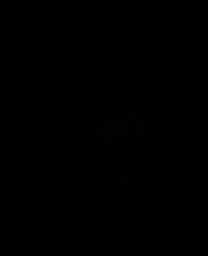

[Series 13: cor dwi_tracew · coronal · 5.0mm · 1.53mm/px · 3 of 52 slices shown]
[im 1/52]
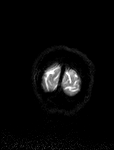
[im 26/52]
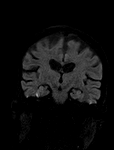
[im 52/52]
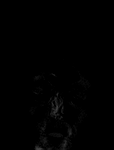

[Series 14: cor dwi_adc · coronal · 5.0mm · 1.53mm/px · 1 of 24 slices shown]
[im 1/24]
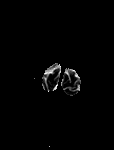

[Series 15: T2 post-contrast · coronal · 5.0mm · 0.57mm/px · 2 of 27 slices shown]
[im 1/27]
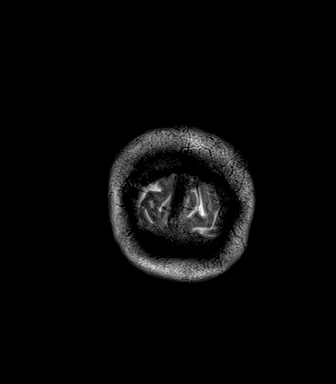
[im 27/27]
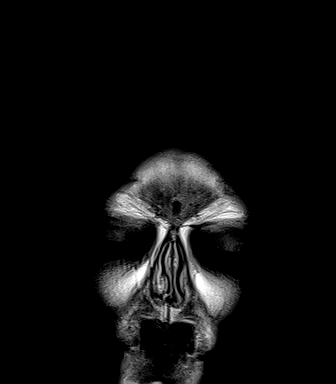

[Series 16: T1 post-contrast · axial · 1.0mm · 0.94mm/px · z∈[-1,+153]mm · 10 of 160 slices shown (1 of 3)]
[im 1/160]
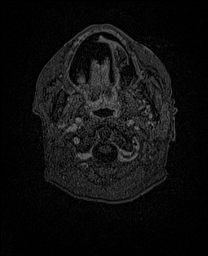
[im 18/160]
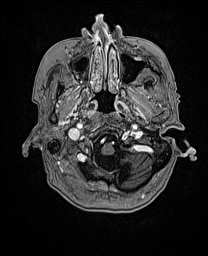
[im 36/160]
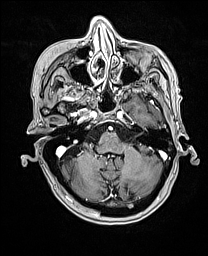
[im 54/160]
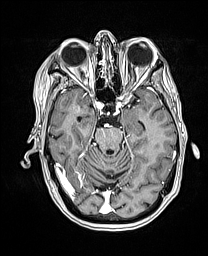
[im 71/160]
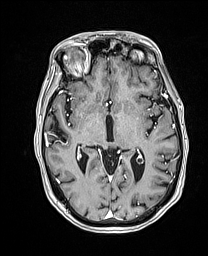
[im 89/160]
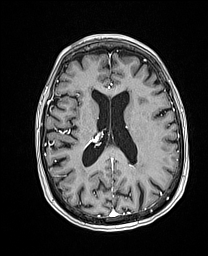
[im 107/160]
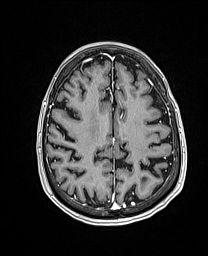
[im 124/160]
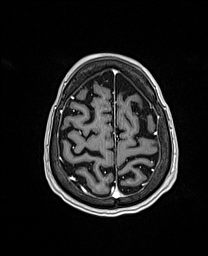
[im 142/160]
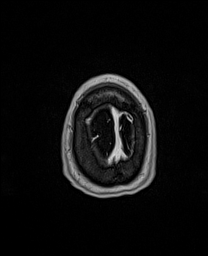
[im 160/160]
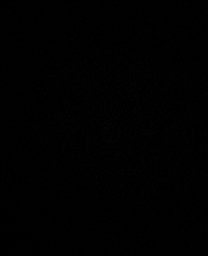

[Series 17: T1 post-contrast · coronal · 5.0mm · 0.43mm/px · 2 of 27 slices shown (2 of 3)]
[im 1/27]
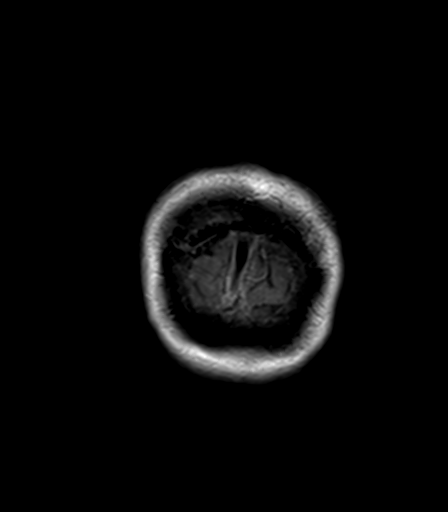
[im 27/27]
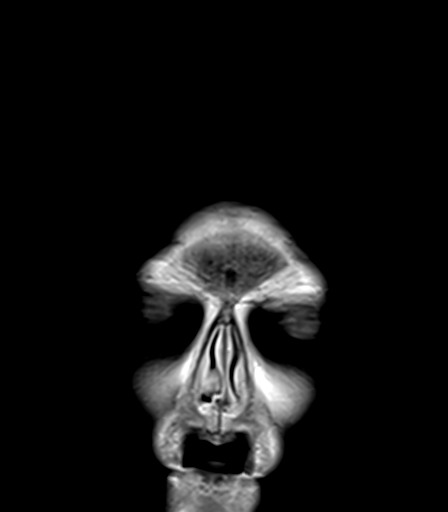

[Series 18: T1 post-contrast · sagittal · 5.0mm · 0.75mm/px · 1 of 24 slices shown (3 of 3)]
[im 1/24]
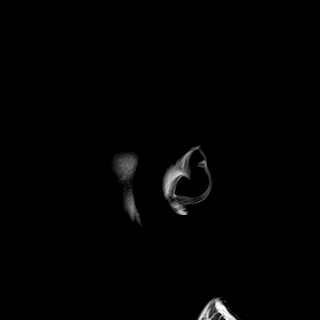

[48 of 48 positions shown; findings below may reference images not displayed]

FINDINGS: Brain: No enhancement or swelling to suggest metastatic disease.
Cerebral volume loss and chronic microvascular ischemia. No acute or
subacute infarct, hemorrhage, hydrocephalus, or collection

Vascular: Normal flow voids and vascular enhancements

Skull and upper cervical spine: No focal marrow lesion.

Sinuses/Orbits: Negative
IMPRESSION: No evidence of metastatic disease.

## 2024-02-23 NOTE — Telephone Encounter (Signed)
 Called and spoke with pts friend Michele [name] (DPR). He states the pt is not coughing up any blood and has not been for the past few days. No blood is present. No acute symptoms. Pt was asking if she could take tablets instead of liquid form and if it was okay for her to take this with her other medications.  Advised pts friend if she started to cough up more amounts of blood to go to ER to be evaluated.   Per 9/11 encounter  Tammy, please advise if okay for patient to take tablets vs liquid, as she can not find liquid mucinex ? Yes tablets are fine      Pts friend is aware, nothing further needed.

## 2024-02-25 ENCOUNTER — Ambulatory Visit: Admission: RE | Admit: 2024-02-25 | Source: Ambulatory Visit

## 2024-02-25 ENCOUNTER — Ambulatory Visit
Admission: RE | Admit: 2024-02-25 | Discharge: 2024-02-25 | Disposition: A | Discharge status: Home / Self Care | Source: Ambulatory Visit | Attending: Family Medicine | Admitting: Family Medicine

## 2024-02-25 DIAGNOSIS — R92323 Mammographic fibroglandular density, bilateral breasts: Secondary | ICD-10-CM | POA: Diagnosis not present

## 2024-02-25 DIAGNOSIS — N644 Mastodynia: Secondary | ICD-10-CM

## 2024-02-25 DIAGNOSIS — R928 Other abnormal and inconclusive findings on diagnostic imaging of breast: Secondary | ICD-10-CM | POA: Diagnosis not present

## 2024-02-26 ENCOUNTER — Telehealth: Payer: Self-pay

## 2024-02-26 ENCOUNTER — Ambulatory Visit: Payer: Self-pay | Admitting: Emergency Medicine

## 2024-02-26 NOTE — Telephone Encounter (Signed)
 Please see other encounter, Pt is scheduled to see Dr Shelah tomorrow morning. NFN

## 2024-02-26 NOTE — Telephone Encounter (Signed)
 Pt called the office. Pt states she has been coughing up blood, about half a teaspoon full. This started this morning. Light but thick mucous with streaks of blood but sometimes she would cough up just blood. Complains of chest congestion. Denies chest pain. Denies fever.   Pt states she has trouble falling asleep. Pt states sometimes she does not feel like she gets enough air. Pt states she does not feel lie she is getting enough sleep.   Pt also complains that she does not know how to use a nebulizer. Pt has also been scheduled for Dr Shelah for tomorrow at 10:30. NFN

## 2024-02-26 NOTE — Telephone Encounter (Signed)
 Coughing up blood and mild shortness of breath both started yesterday Today coughed up possibly half a teaspoon She stopped Mucinex  two days ago Called [facility] to see about further scheduling for patient & assistance with equipment questions   FYI Only or Action Required?: Action required by provider: clinical question for provider, update on patient condition, and no appointments available today and patient refuses urgent care or emergency room.  Patient is followed in Pulmonology for COPD, last seen on 02/05/2024 by Parrett, Madelin RAMAN, NP.  Called Nurse Triage reporting Hemoptysis.  Symptoms began yesterday.  Interventions attempted: Maintenance inhaler and Increased fluids/rest.  Symptoms are: unchanged.  Triage Disposition: See HCP Within 4 Hours (Or PCP Triage)  Patient/caregiver understands and will follow disposition?:      Copied from CRM #[id number]. Topic: Clinical - Red Word Triage >> Feb 26, 2024  2:37 PM Devaughn RAMAN wrote: Red Word that prompted transfer to Nurse Triage: Patient stated she is bleeding when she coughs. Reason for Disposition  [1] MILD difficulty breathing (e.g., minimal/no SOB at rest, SOB with walking, pulse < 100) AND [2] still present when not coughing  (Exception: No change from usual, chronic shortness of breath.)  Answer Assessment - Initial Assessment Questions E2C2 Pulmonary Triage - Initial Assessment Questions Chief Complaint (e.g., cough, sob, wheezing, fever, chills, sweat or additional symptoms) *Go to specific symptom protocol after initial questions. Coughing up blood, shortness of breath  How long have symptoms been present? Coughing up  blood since yesterday  Have you tested for COVID or Flu? Note: If not, ask patient if a home test can be taken. If so, instruct patient to call back for positive results. No  MEDICINES:   Have you used any OTC meds to help with symptoms? Yes If yes, ask What medications? Mucinex  DM---stopped two days  ago  Have you used your inhalers/maintenance medication? Yes If yes, What medications? Spiriva -- Place 1 capsule (18 mcg total) into inhaler and inhale daily.  Arnuity Ellipta --Take 1 puff by mouth daily    If inhaler, ask How many puffs and how often? Note: Review instructions on medication in the chart. Spiriva -- Place 1 capsule (18 mcg total) into inhaler and inhale daily.  Arnuity Ellipta --Take 1 puff by mouth daily    OXYGEN : Do you wear supplemental oxygen ? No If yes, How many liters are you supposed to use? N/a  Do you monitor your oxygen  levels? Yes If yes, What is your reading (oxygen  level) today? 98%  89 pulse  What is your usual oxygen  saturation reading?  (Note: Pulmonary O2 sats should be 90% or greater) N/a      3. SPUTUM: Describe the color of your sputum (e.g., none, dry cough; clear, white, yellow, green)     White slightly yellow 4. HEMOPTYSIS: How much blood? (e.g., flecks, streaks, tablespoons)     Yes  maybe half a teaspoon 5. DIFFICULTY BREATHING: Are you having difficulty breathing? If Yes, ask: How bad is it? (e.g., mild, moderate, severe)      mild 6. FEVER: Do you have a fever? If Yes, ask: What is your temperature, how was it measured, and when did it start?     Last night patient states she felt like she could not breathe 7. CARDIAC HISTORY: Do you have any history of heart disease? (e.g., heart attack, congestive heart failure)      Has a cardiologist 8. LUNG HISTORY: Do you have any history of lung disease?  (e.g., pulmonary embolus, asthma, emphysema)  Copd, coughing up blood 9. PE RISK FACTORS: Do you have a history of blood clots? Note: Other risk factors include recent major surgery, recent prolonged travel, being bedridden.     No 10. OTHER SYMPTOMS: Do you have any other symptoms? (e.g., runny nose, wheezing, chest pain)       ---  Protocols used: Coughing Up Blood-A-AH

## 2024-02-27 ENCOUNTER — Encounter: Payer: Self-pay | Admitting: Emergency Medicine

## 2024-02-27 ENCOUNTER — Ambulatory Visit (INDEPENDENT_AMBULATORY_CARE_PROVIDER_SITE_OTHER): Admitting: Emergency Medicine

## 2024-02-27 VITALS — BP 134/83 | HR 85 | Temp 98.5°F | Resp 18 | Ht 65.0 in | Wt 134.5 lb

## 2024-02-27 DIAGNOSIS — C3491 Malignant neoplasm of unspecified part of right bronchus or lung: Secondary | ICD-10-CM

## 2024-02-27 DIAGNOSIS — J471 Bronchiectasis with (acute) exacerbation: Secondary | ICD-10-CM

## 2024-02-27 DIAGNOSIS — J439 Emphysema, unspecified: Secondary | ICD-10-CM | POA: Diagnosis not present

## 2024-02-27 DIAGNOSIS — F17210 Nicotine dependence, cigarettes, uncomplicated: Secondary | ICD-10-CM

## 2024-02-27 DIAGNOSIS — Z720 Tobacco use: Secondary | ICD-10-CM

## 2024-02-27 MED ORDER — DOXYCYCLINE HYCLATE 100 MG PO TABS: 100.0000 mg | ORAL_TABLET | Freq: Two times a day (BID) | ORAL | 0 refills | Status: DC

## 2024-02-27 MED ORDER — SODIUM CHLORIDE 3 % IN NEBU: INHALATION_SOLUTION | RESPIRATORY_TRACT | 12 refills | Status: AC | PRN

## 2024-02-27 MED ORDER — AZITHROMYCIN 250 MG PO TABS: 250.0000 mg | ORAL_TABLET | Freq: Once | ORAL | 11 refills | Status: AC

## 2024-02-27 NOTE — Assessment & Plan Note (Signed)
 Acute on chronic bronchitic symptoms, persistent due to her continued tobacco use.  She has purulent mucus, some streaks of blood.  Her CT scan of the chest 02/06/2024 was reassuring.  Will treat with doxycycline  and then add scheduled daily azithromycin  after that course is completed.  Continue Mucinex .  Add back 3% saline nebs.  She needs refills of this.

## 2024-02-27 NOTE — Assessment & Plan Note (Signed)
 CT scan of the chest reviewed.  No evidence of recurrence.  Serial imaging as per oncology plans.

## 2024-02-27 NOTE — Assessment & Plan Note (Signed)
 She has purulent mucus and bronchitic symptoms but she is not wheezing on exam today.  I think we can defer steroids (which she tolerates poorly anyway).  Continue her Arnuity and her bronchodilator regimen as ordered.  Will concentrate on treating the bronchitis/bronchiectasis.  She we will start scheduled daily azithromycin  for both antibiotic and anti-inflammatory effects after she finishes her doxycycline .  Discussed with her that her tobacco use continues to drive the symptoms, prevent their resolution/optimization.

## 2024-02-27 NOTE — Patient Instructions (Signed)
 We reviewed your CT scan of the chest today.  There was no evidence of blood clot, recurrent cancer, pulmonary nodules or pneumonia. Please take doxycycline  100 mg twice a day for 7 days. After you finish the doxycycline  please start azithromycin  250 mg once daily.  You will take this every day on a schedule until we give you instructions to stop it. Continue your Arnuity as you have been taking it.  Rinse and gargle after using. Continue Spiriva  once daily. Use your Xopenex  (levalbuterol ) 2 puffs up to every 4 hours if needed for shortness of breath, chest tightness, wheezing. Continue to use Mucinex  (not Mucinex  DM) once daily Start using 3% saline nebulizer treatments 1-2 times a day to help you clear mucus more effectively.  We will show you how to use your nebulizer machine today You desperately need to stop smoking.  This is the primary cause of your day-to-day symptoms and we will not be able to get full control unless you are able to stop. Follow in our office in 1 month.
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
## 2024-02-27 NOTE — Assessment & Plan Note (Signed)
 Discussed tobacco cessation with her.  She is not in a position to set a quit date at this time.  She may be able to start cutting down.  We discussed this and plans to do so.

## 2024-03-02 ENCOUNTER — Ambulatory Visit: Payer: Self-pay | Admitting: Emergency Medicine

## 2024-03-02 DIAGNOSIS — J439 Emphysema, unspecified: Secondary | ICD-10-CM | POA: Diagnosis not present

## 2024-03-02 DIAGNOSIS — J471 Bronchiectasis with (acute) exacerbation: Secondary | ICD-10-CM | POA: Diagnosis not present

## 2024-03-02 DIAGNOSIS — R1319 Other dysphagia: Secondary | ICD-10-CM

## 2024-03-02 NOTE — Telephone Encounter (Signed)
 Patient refused to go to the ER at this time  Patient states that she cannot swallow her pills Patient taking doyxcycline Heart rate was 100 when she woke up in the middle of the night and felt like it was fast Patient states that she feels like her throat is swollen after starting this medicine  Denies difficulty breathing but then states slightly short of breath X 3 days  Pharmacy wouldn't fill the Azithromycin  because of how it was written  Not dizzy during triage  Ellipta--Take 1 puff by mouth daily Spiriva -- Place 1 capsule (18 mcg total) into inhaler and inhale daily.   Oxygen  97% 84 heart rate  Patient wants to be called back on [phone]  FYI Only or Action Required?: Action required by provider: clinical question for provider and update on patient condition.  Patient is followed in Pulmonology for pulmonary emphysema, last seen on 02/27/2024 by Shelah Lamar RAMAN, MD.  Called Nurse Triage reporting Palpitations, Sore Throat, and Dysphagia.  Symptoms began several days ago.  Interventions attempted: Nothing.  Symptoms are: unchanged.  Triage Disposition: Go to ED Now (Notify PCP)  Patient/caregiver understands and will follow disposition?: No, wishes to speak with PCP              Copied from CRM [id number]. Topic: Clinical - Red Word Triage >> Mar 02, 2024  1:12 PM Ismael A wrote: [facility] that prompted transfer to Nurse Triage: throat feels swollen, high pulse, heart races when she lays down, headache - she is on doxycycline  (VIBRA -TABS) 100 MG tablet and states symptoms started after she started taking this Reason for Disposition . SEVERE difficulty swallowing (e.g., drooling or spitting, can't swallow water)  Answer Assessment - Initial Assessment Questions Patient refused to go to the ER at this time  Patient states that she cannot swallow her pills & states she choked on water yesterday Patient taking doyxcycline Heart rate was 100 Patient  states that she feels like her throat is swollen after starting this medicine  Denies difficulty breathing but then states slightly short of breath X 3 days  Pharmacy wouldn't fill the Azithromycin  because of how it was written  Not dizzy during triage  Ellipta--Take 1 puff by mouth daily Spiriva --Place 1 capsule (18 mcg total) into inhaler and inhale daily.   Oxygen  97% 84 heart rate Patient states slightly shortness of breath  Patient wants to be called back on [phone]   1. DESCRIPTION: Tell me more about this problem. Are you  having trouble swallowing liquids, solids, or both? Any trouble with swallowing saliva (spit)?     Patient states pills wont go straight down, she puts them to the side of her mouth/throat 2. SEVERITY: How bad is the swallowing difficulty?  (Scale 1-10; or mild, moderate, severe)     Patient states she cannot swallow a pill straight down 3. ONSET: When did the swallowing problems begin?      About 3 days ago 4. CAUSE: What do you think is causing the problem?  (e.g., dry mouth, food or pill stuck in throat, mouth pain, sore throat, progression of disease process such as dementia or Parkinson's disease).      Not sure 5. CHRONIC or RECURRENT: Is this a new problem for you?  If No, ask: How long have you had this problem? (e.g., days, weeks, months)      ---- 6. OTHER SYMPTOMS: Do you have any other symptoms? (e.g., chest pain, difficulty breathing, mouth sores, sore throat, swollen tongue,  chest pain)     Patient states very slightly short of breath,  Protocols used: Swallowing Difficulty-A-AH

## 2024-03-03 ENCOUNTER — Telehealth: Payer: Self-pay | Admitting: Student

## 2024-03-03 NOTE — Telephone Encounter (Signed)
 She's taken azithromycin  numerous times, as recent as 07/2023 and tolerated well from prior notes. It does not cause palpitations. This is what she is supposed to start and take daily, regardless of symptoms. She should start this as prescribed.   Please verify if she is still having streaks of blood in her sputum and purulent mucus. If so, can send in alternative abx for her to take for the exacerbation. Thanks.

## 2024-03-03 NOTE — Telephone Encounter (Signed)
 ATC patient at [phone].  I left a message to return call.

## 2024-03-03 NOTE — Telephone Encounter (Signed)
 Patient called back stating that she stopped taking the Doxycycline  after 3-4 days due to it causing her heart to race and she states she does not want to take the Zithromax  as well due to the same concern. Please advise.   Patient would like a call back at [phone].

## 2024-03-03 NOTE — Telephone Encounter (Signed)
 I returned call the patient's friend, Debby [facility]) at [phone], the patient was there with him during the call.  I verified that she stopped taking the Doxycycline  after 3-4 days because of side effects (palpitations, swelling of lymph node on right side).  He stated she had no rash.  He said she has had difficulty taking her pills/swallowing them and swallowing food prior to taking the doxycycline .  She also states she cannot take the Azithromycin  because of palpitations (or heart racing).  I asked if she knew what antibiotic she could take?  I was told she only knew what antibiotic she could not take.  I explained that Dr. Shelah wanted her to take Doxycycline  and then start Azithromycin  daily after completing the Doxycycline , however she is reporting she cannot take either antibiotic and we need to know what she can take as far as antibiotics.  She also cannot take Penicillin.  I asked if she has seen her PCP recently regarding the difficulty swallowing.  I was told she has not see her PCP in a couple of months.  I asked if she has a GI doctor and was told no.  Debby asked if we could refer her to a GI doctor for the difficulty swallowing.  I advised him that I would have to send a message to the provider of the day since Dr. Shelah is out of the office currently and once we hear back from them we will call them back with recommendations.  He verbalized understanding.  He requested to be called back at [phone].  [name], I am not sure what to do with this patient.  She says she cannot take the Doxycycline  or the Azithromycin  and they were both prescribed by Dr. Shelah who is currently not available.  She could not tell me what antibiotic she can take.  They also want a referral to GI for Dysphagia.  Please advise.  Thank you.

## 2024-03-03 NOTE — Telephone Encounter (Signed)
   Patient called after-hours line with concerns about accidentally taking extra dose of the medication.  Called and spoke with patient.  She states she thinks she took an extra dose of Pravastatin  this evening.  She usually takes Pravastatin  80 mg 3 times a week but thinks she  accidentally took an extra dose of this as it looked similar to a vitamin that she takes.  She wanted to make sure this was not dangerous. Reassured her that a one time double dose of Pravastatin  should not cause any real harm. She voiced understanding and thanked me for calling.  Damyiah Moxley E Cire Deyarmin, PA-C 03/03/2024 7:51 PM

## 2024-03-03 NOTE — Telephone Encounter (Signed)
 I called patient's pharmacy and clarified instructions for azithromycin .  Per Dr. Lanny last OV note, it states:  After you finish the doxycycline  please start azithromycin  250 mg once daily.  Pharmacy changed the script and will fill it today.

## 2024-03-04 NOTE — Telephone Encounter (Signed)
 I called and spoke with Michele [name] (DPR), provided information/recommendations per Michele Rouleau NP.  She is no longer having blood in her sputum, her mucous is no longer thick and yellow, it has returned to what is normal for her.  She is no longer feeling sick.  Advised to call the office back if she starts having blood in her mucous again or if her mucous turns green or yellow again or begins to feel sick.  He verbalized understanding.  I had spoken with Michele yesterday and she had advised that a GI referral was a good idea and could be placed.  [name] that we would place that referral and the GI office would call them to set up the appointment.  He asked that they call him at  [phone].  Advised I would put his number for contact in the referral.  Nothing further needed.

## 2024-03-09 ENCOUNTER — Ambulatory Visit: Payer: Self-pay

## 2024-03-09 NOTE — Telephone Encounter (Signed)
 FYI Only or Action Required?: FYI only for provider.  Patient is followed in Pulmonology for Pulmonary Emphysema, last seen on 02/27/2024 by Michele Lamar RAMAN, MD.  Called Nurse Triage reporting Foot Swelling.  Symptoms began several days ago.  Interventions attempted: Nothing.  Symptoms are: gradually worsening.  Triage Disposition: See Physician Within 24 Hours  Patient/caregiver understands and will follow disposition?:   Copied from CRM #[id number]. Topic: Clinical - Red Word Triage >> Mar 09, 2024 11:57 AM Celestine FALCON wrote: Red Word that prompted transfer to Nurse Triage: Pt stated on 9/26 she saw Dr. Shelah at the [facility]. She stated she was told (referenced also in after visit summary) Please take doxycycline  100 mg twice a day for 7 days. After you finish the doxycycline  please start azithromycin  250 mg once daily.  You will take this every day on a schedule until we give you instructions to stop it.  Pt stated she is having swelling in her left foot and wanted to know if that was normal or if that may be something related to the medication. Lauriana did say she has heart problems. Reason for Disposition  [1] MODERATE leg swelling (e.g., swelling extends up to knees) AND [2] new-onset or getting worse  Answer Assessment - Initial Assessment Questions Pt states she noticed her L foot start swelling three days ago. States theres slight redness at the creases of her toes. Denies fever or other sxs. Pt has been taking abx rx by Dr. Shelah and would like to discuss at appointment tomorrow if she is continuing with the second abx, azithromycin    1. ONSET: When did the swelling start? (e.g., minutes, hours, days)     3 days 2. LOCATION: What part of the leg is swollen?  Are both legs swollen or just one leg?     L foot 3. SEVERITY: How bad is the swelling? (e.g., localized; mild, moderate, severe)     mild 4. REDNESS: Is there redness or signs of infection?     Redness near  toes 5. PAIN: Is the swelling painful to touch? If Yes, ask: How painful is it?   (Scale 1-10; mild, moderate or severe)     Denies 6. FEVER: Do you have a fever? If Yes, ask: What is it, how was it measured, and when did it start?      Denies 7. CAUSE: What do you think is causing the leg swelling?     Unsure; states its just weird 8. MEDICAL HISTORY: Do you have a history of blood clots (e.g., DVT), cancer, heart failure, kidney disease, or liver failure?     HF 9. RECURRENT SYMPTOM: Have you had leg swelling before? If Yes, ask: When was the last time? What happened that time?     Admits; but states this is bigger 10. OTHER SYMPTOMS: Do you have any other symptoms? (e.g., chest pain, difficulty breathing)       denies  Protocols used: Leg Swelling and Edema-A-AH

## 2024-03-09 NOTE — Telephone Encounter (Signed)
 Acute scheduled 10/8

## 2024-03-10 ENCOUNTER — Telehealth: Payer: Self-pay | Admitting: Emergency Medicine

## 2024-03-10 ENCOUNTER — Encounter: Payer: Self-pay | Admitting: Pulmonary Disease

## 2024-03-10 ENCOUNTER — Ambulatory Visit (INDEPENDENT_AMBULATORY_CARE_PROVIDER_SITE_OTHER): Admitting: Pulmonary Disease

## 2024-03-10 VITALS — BP 111/66 | HR 74 | Ht 65.0 in | Wt 133.0 lb

## 2024-03-10 DIAGNOSIS — R531 Weakness: Secondary | ICD-10-CM

## 2024-03-10 DIAGNOSIS — J449 Chronic obstructive pulmonary disease, unspecified: Secondary | ICD-10-CM

## 2024-03-10 DIAGNOSIS — R600 Localized edema: Secondary | ICD-10-CM

## 2024-03-10 DIAGNOSIS — F17210 Nicotine dependence, cigarettes, uncomplicated: Secondary | ICD-10-CM | POA: Diagnosis not present

## 2024-03-10 DIAGNOSIS — R1310 Dysphagia, unspecified: Secondary | ICD-10-CM

## 2024-03-10 DIAGNOSIS — R197 Diarrhea, unspecified: Secondary | ICD-10-CM

## 2024-03-10 DIAGNOSIS — J432 Centrilobular emphysema: Secondary | ICD-10-CM

## 2024-03-10 NOTE — Patient Instructions (Addendum)
 Stop doxycycline  and stop Azithromycin  antibiotics due to the diarrhea  If you continue to have diarrhea, abdominal pain, nausea, vomitting or fevers/chills 2-3 days after stopping antibiotics, recommend going to the ER to be tested for c. Diff.  Continue inhaler and nebulizer regimen per Dr. Shelah  Stay hydrated while experiencing the diarrhea  Follow up with Dr. Shelah as scheduled
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
## 2024-03-10 NOTE — Telephone Encounter (Signed)
 Copied from CRM [id number]. Topic: Referral - Request for Referral >> Mar 09, 2024 11:51 AM Celestine FALCON wrote: Did the patient discuss referral with their provider in the last year? Yes (If No - schedule appointment) (If Yes - send message)  Appointment offered? No; pt was seen on 9/26 by Dr. Shelah and has an appt on 05/14/24 with Dr. Shelah  Type of order/referral and detailed reason for visit: Pt stated she was supposed to have a referral for ENT for her throat, but I did not see a referral for this in the pt's chart.   Preference of office, provider, location: n/a  If referral order, have you been seen by this specialty before? No (If Yes, this issue or another issue? When? Where?  Can we respond through MyChart? Yes

## 2024-03-11 ENCOUNTER — Ambulatory Visit: Payer: Self-pay

## 2024-03-11 NOTE — Telephone Encounter (Signed)
 FYI Only or Action Required?: FYI only for provider.  Patient is followed in Pulmonology  last seen on 03/10/2024 by Kara Dorn NOVAK, MD.  Called Nurse Triage reporting Medication Problem.   Triage Disposition: Call PCP When Office is Open  Patient/caregiver understands and will follow disposition?: Yes  **See note note below**        Copied from CRM #[id number]. Topic: Clinical - Prescription Issue >> Mar 11, 2024  1:26 PM Nathanel DEL wrote: Reason for CRM: pt states she had to call the ambulance Wed nite.  She not sure what she is taking Reason for Disposition  [1] Caller requesting NON-URGENT health information AND [2] PCP's office is the best resource  Answer Assessment - Initial Assessment Questions 1. REASON FOR CALL: What is the main reason for your call? or How can I best help you?   Patient stated she was sent out a nebulizer, once she took the medication she stated her heart began racing so she called the ambulance. She stated the sodium chloride  inhalation solution caused the symptoms. She says the levalbuterol  (XOPENEX  HFA) 45 MCG/ACT inhaler is what works, not the sodium chloride . I had a hard time hearing and understanding patient due to background noise, is someone able to follow-up?  Protocols used: Information Only Call - No Triage-A-AH

## 2024-03-11 NOTE — Telephone Encounter (Signed)
 Patient scheduled to see Almarie Ferrari NP on 03/30/24 and Dr. Shelah on 05/14/24.  Nothing further needed.

## 2024-03-11 NOTE — Telephone Encounter (Signed)
 FYI Only or Action Required?: Action required by provider: Requesting medication for a possible yeast infection.  Patient was last seen in primary care on unknown.  Called Nurse Triage reporting Medication Problem.  Symptoms began several days ago.  Interventions attempted: Nothing.  Symptoms are: gradually worsening.  Triage Disposition: See PCP When Office is Open (Within 3 Days)  Patient/caregiver understands and will follow disposition?: Unsure       Message from [place] J sent at 03/11/2024 11:59 AM EDT  Summary: Yeast infection   Reason for Triage:  Pt was seen by Dewald on 10/8 and was advised to discontinue azithromycin  due to associated diarrhea.  Pt discontinued antibiotic, but is now experiencing possible yeast infection symptoms  -burning sensation -abdominal pressure  Requesting medication to treat possible infection  Pt of Dr Kara         Reason for Disposition  Prescription request for new medicine (not a refill)  Answer Assessment - Initial Assessment Questions Patient seen in office yesterday and was advised to stop antibiotics. Pt requesting mediation for a possible yeats infection. Preferred pharmacy below.   [id number] [address], [place] - [phone] AT [facility] [place] [facility] & [address]  [address], [place] [address]    1. NAME of MEDICINE: What medicine(s) are you calling about?     Doxycycline  and azithromycin   2. QUESTION: What is your question? (e.g., double dose of medicine, side effect)     Side effect  3. SYMPTOMS: Do you have any symptoms? If Yes, ask: What symptoms are you having?  How bad are the symptoms (e.g., mild, moderate, severe)     Patient states symptoms started a few days ago. She has had vaginal burning and pain. Patient has had some white vaginal discharge as well.  Protocols used: Medication Question Call-A-AH

## 2024-03-16 ENCOUNTER — Ambulatory Visit: Payer: Self-pay

## 2024-03-16 DIAGNOSIS — H9201 Otalgia, right ear: Secondary | ICD-10-CM

## 2024-03-16 NOTE — Telephone Encounter (Signed)
 FYI Only or Action Required?: Action required by provider: medication refill request and clinical question for provider.  Patient is followed in Pulmonology for Emphysema and Lung Ca, last seen on 03/10/2024 by Kara Dorn NOVAK, MD.  Called Nurse Triage reporting Medication Reaction.  Symptoms began N/A.  Interventions attempted: Other: N/A.  Symptoms are: N/A.  Triage Disposition: Call PCP When Office is Open  Patient/caregiver understands and will follow disposition?: Yes  Copied from CRM #[id number]. Topic: Clinical - Red Word Triage >> Mar 16, 2024  4:11 PM Rilla B wrote: [facility] that prompted transfer to Nurse Triage: weakness, some diff breathing. Reason for Disposition  [1] Caller has NON-URGENT medicine question about med that PCP prescribed AND [2] triager unable to answer question  Answer Assessment - Initial Assessment Questions Pt states she received sodium chloride  HYPERTONIC 3 % nebulizer solution for her nebulizer instead of her Levalbuterol . She used it in her nebulizer 3-4 weeks ago and it made her heart race very quickly. Requesting a call back because she thinks it was a reaction and she wants the levalbuterol . Denies sxs currently also asking referral to throat doctor  Requests call to [phone]  1. NAME of MEDICINE: What medicine(s) are you calling about?     sodium chloride  HYPERTONIC 3 % nebulizer solution 2. QUESTION: What is your question? (e.g., double dose of medicine, side effect)     Can she have levalbuterol  or other nebulizer medication.  3. PRESCRIBER: Who prescribed the medicine? Reason: if prescribed by specialist, call should be referred to that group.     [place] 4. SYMPTOMS: Do you have any symptoms? If Yes, ask: What symptoms are you having?  How bad are the symptoms (e.g., mild, moderate, severe)     denies  Protocols used: Medication Question Call-A-AH

## 2024-03-17 ENCOUNTER — Emergency Department (HOSPITAL_COMMUNITY)

## 2024-03-17 ENCOUNTER — Encounter (HOSPITAL_COMMUNITY): Payer: Self-pay | Admitting: Emergency Medicine

## 2024-03-17 ENCOUNTER — Emergency Department (HOSPITAL_COMMUNITY)
Admission: EM | Admit: 2024-03-17 | Discharge: 2024-03-17 | Disposition: A | Discharge status: Home / Self Care | Attending: Emergency Medicine | Admitting: Emergency Medicine

## 2024-03-17 ENCOUNTER — Other Ambulatory Visit: Payer: Self-pay

## 2024-03-17 DIAGNOSIS — W19XXXA Unspecified fall, initial encounter: Secondary | ICD-10-CM | POA: Diagnosis not present

## 2024-03-17 DIAGNOSIS — R519 Headache, unspecified: Secondary | ICD-10-CM | POA: Diagnosis not present

## 2024-03-17 DIAGNOSIS — Z7901 Long term (current) use of anticoagulants: Secondary | ICD-10-CM | POA: Insufficient documentation

## 2024-03-17 DIAGNOSIS — S60221A Contusion of right hand, initial encounter: Secondary | ICD-10-CM | POA: Diagnosis not present

## 2024-03-17 DIAGNOSIS — Z79899 Other long term (current) drug therapy: Secondary | ICD-10-CM | POA: Diagnosis not present

## 2024-03-17 DIAGNOSIS — Z043 Encounter for examination and observation following other accident: Secondary | ICD-10-CM | POA: Diagnosis not present

## 2024-03-17 DIAGNOSIS — J449 Chronic obstructive pulmonary disease, unspecified: Secondary | ICD-10-CM | POA: Diagnosis not present

## 2024-03-17 DIAGNOSIS — J439 Emphysema, unspecified: Secondary | ICD-10-CM | POA: Diagnosis not present

## 2024-03-17 DIAGNOSIS — G8929 Other chronic pain: Secondary | ICD-10-CM | POA: Insufficient documentation

## 2024-03-17 DIAGNOSIS — S0990XA Unspecified injury of head, initial encounter: Secondary | ICD-10-CM | POA: Diagnosis not present

## 2024-03-17 DIAGNOSIS — S299XXA Unspecified injury of thorax, initial encounter: Secondary | ICD-10-CM | POA: Diagnosis not present

## 2024-03-17 DIAGNOSIS — R059 Cough, unspecified: Secondary | ICD-10-CM | POA: Diagnosis not present

## 2024-03-17 DIAGNOSIS — I6523 Occlusion and stenosis of bilateral carotid arteries: Secondary | ICD-10-CM | POA: Diagnosis not present

## 2024-03-17 DIAGNOSIS — S6991XA Unspecified injury of right wrist, hand and finger(s), initial encounter: Secondary | ICD-10-CM | POA: Diagnosis present

## 2024-03-17 LAB — COMPREHENSIVE METABOLIC PANEL WITH GFR
ALT: 12 U/L (ref 0–44)
AST: 16 U/L (ref 15–41)
Albumin: 3.7 g/dL (ref 3.5–5.0)
Alkaline Phosphatase: 61 U/L (ref 38–126)
Anion gap: 11 (ref 5–15)
BUN: 12 mg/dL (ref 8–23)
CO2: 25 mmol/L (ref 22–32)
Calcium: 9.4 mg/dL (ref 8.9–10.3)
Chloride: 104 mmol/L (ref 98–111)
Creatinine, Ser: 0.77 mg/dL (ref 0.44–1.00)
GFR, Estimated: >60 mL/min (ref >60)
Glucose, Bld: 97 mg/dL (ref 70–99)
Potassium: 4.2 mmol/L (ref 3.5–5.1)
Sodium: 140 mmol/L (ref 135–145)
Total Bilirubin: 0.4 mg/dL (ref 0.0–1.2)
Total Protein: 6.2 g/dL — ABNORMAL LOW (ref 6.5–8.1)

## 2024-03-17 LAB — SAMPLE TO BLOOD BANK

## 2024-03-17 LAB — CBC
HCT: 44.6 % (ref 36.0–46.0)
Hemoglobin: 14.5 g/dL (ref 12.0–15.0)
MCH: 30.2 pg (ref 26.0–34.0)
MCHC: 32.5 g/dL (ref 30.0–36.0)
MCV: 92.9 fL (ref 80.0–100.0)
Platelets: 193 K/uL (ref 150–400)
RBC: 4.8 MIL/uL (ref 3.87–5.11)
RDW: 13.3 % (ref 11.5–15.5)
WBC: 5.6 K/uL (ref 4.0–10.5)
nRBC: 0 % (ref 0.0–0.2)

## 2024-03-17 LAB — PROTIME-INR
INR: 1 (ref 0.8–1.2)
Prothrombin Time: 14.1 s (ref 11.4–15.2)

## 2024-03-17 LAB — I-STAT CHEM 8, ED
BUN: 14 mg/dL (ref 8–23)
Calcium, Ion: 1.24 mmol/L (ref 1.15–1.40)
Chloride: 104 mmol/L (ref 98–111)
Creatinine, Ser: 0.8 mg/dL (ref 0.44–1.00)
Glucose, Bld: 92 mg/dL (ref 70–99)
HCT: 42 % (ref 36.0–46.0)
Hemoglobin: 14.3 g/dL (ref 12.0–15.0)
Potassium: 4.3 mmol/L (ref 3.5–5.1)
Sodium: 141 mmol/L (ref 135–145)
TCO2: 27 mmol/L (ref 22–32)

## 2024-03-17 LAB — I-STAT CG4 LACTIC ACID, ED: Lactic Acid, Venous: 0.7 mmol/L (ref 0.5–1.9)

## 2024-03-17 LAB — ETHANOL: Alcohol, Ethyl (B): <15 mg/dL (ref <15)

## 2024-03-17 MED ORDER — MORPHINE SULFATE (PF) 4 MG/ML IV SOLN
4.0000 mg | Freq: Once | INTRAVENOUS | Status: AC
  Administered 2024-03-17: 4 mg via INTRAVENOUS
  Filled 2024-03-17: qty 1

## 2024-03-17 MED ORDER — ONDANSETRON HCL 4 MG/2ML IJ SOLN
4.0000 mg | Freq: Once | INTRAMUSCULAR | Status: AC
  Administered 2024-03-17: 4 mg via INTRAVENOUS
  Filled 2024-03-17: qty 2

## 2024-03-17 NOTE — Discharge Instructions (Signed)
 Please follow-up with an orthopedic specialist in regard to your chronic knee pain bilaterally, they may be able to refer you to a physical therapist who can help you work on your strength/ambulation.  Return the emergency department if your symptoms worsen.  Follow-up with your primary care provider.
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
## 2024-03-17 NOTE — ED Provider Notes (Signed)
**Michele [name] De-Identified via Obfuscation**  Michele Michele [name]   Michele [name]: [phone] Arrival date & time: 03/17/24  8077     Patient presents with: Michele Michele [name] Michele Michele [name] is a 68 y.o. female.   68 year old female presenting after a fall.  Patient was walking to the kitchen to take her medications around 6 PM and she states I do not know what happened, I just fell, striking the back of her head on the coffee table.  This was witnessed by her boyfriend, who told her that at no point did she lose consciousness.  She describes 8/10 headache since falling.  She also believes she may have injured her right wrist/hand, as it was not bruised/swollen after this incident occurred.  She is on Plavix .  She describes shortness of breath as well, which comes and goes for her due to her history of COPD.  No other injuries to report.   Fall       Prior to Admission medications   Medication Sig Start Date End Date Taking? Authorizing Provider  acetaminophen  (TYLENOL ) 325 MG tablet Take 2 tablets (650 mg total) by mouth every 6 (six) hours as needed for mild pain, fever or headache. 12/23/20   Lue Elsie BROCKS, MD  ALPRAZolam  (XANAX ) 1 MG tablet Take 0.5 mg by mouth as needed for anxiety or sleep.    [provider]  ARNUITY ELLIPTA  100 MCG/ACT AEPB Take 1 puff by mouth daily. 02/06/24   Shelah Lamar RAMAN, MD  Calcium  Citrate-Vitamin D  (CITRACAL + D PO) Take 1 tablet by mouth in the morning and at bedtime.     [provider]  clopidogrel  (PLAVIX ) 75 MG tablet TAKE 1 TABLET BY MOUTH EVERY MONDAY, WEDNESDAY AND FRIDAY 02/06/24   Anner Alm ORN, MD  co-enzyme Q-10 30 MG capsule Take 30 mg by mouth daily.    [provider]  Evolocumab  (REPATHA  SURECLICK) 140 MG/ML SOAJ Inject 140 mg into the skin every 14 (fourteen) days. 09/08/23   Anner Alm ORN, MD  fluticasone  (FLONASE ) 50 MCG/ACT nasal spray INSTILL 1 SPRAY INTO BOTH NOSTRILS DAILY 09/14/20   Hope Almarie ORN, NP   guaiFENesin  (MUCINEX ) 600 MG 12 hr tablet Take 1 tablet (600 mg total) by mouth 2 (two) times daily. 06/29/21   Hope Almarie ORN, NP  isosorbide  mononitrate (IMDUR ) 30 MG 24 hr tablet Take 2 tablets (60 mg total) by mouth daily. 08/22/23 03/10/24  Meng, Hao, PA  levalbuterol  (XOPENEX  HFA) 45 MCG/ACT inhaler Inhale 2 puffs into the lungs every 6 (six) hours as needed for wheezing. 08/20/23   Jude Harden GAILS, MD  levalbuterol  (XOPENEX ) 0.63 MG/3ML nebulizer solution Take 3 mLs (0.63 mg total) by nebulization every 4 (four) hours as needed for wheezing or shortness of breath (dx: R00.2, J44.9). 11/15/22   Sood, Vineet, MD  Multiple Vitamins-Minerals (CENTRUM SILVER ULTRA WOMENS) TABS See admin instructions.    [provider]  nitroGLYCERIN  (NITROSTAT ) 0.4 MG SL tablet PLACE 1 TABLET UNDER TONGUE EVERY 5 MINUTES AS NEEDED FOR CHEST PAIN 10/10/22   Anner Alm ORN, MD  pantoprazole  (PROTONIX ) 40 MG tablet Take 1 tablet (40 mg total) by mouth daily. 11/20/22   Vicci Rollo SAUNDERS, PA-C  polyethylene glycol (MIRALAX  / GLYCOLAX ) 17 g packet Take 17 g by mouth daily as needed for mild constipation.    [provider]  pravastatin  (PRAVACHOL ) 80 MG tablet Take 1 tablet by mouth every Monday, Wednesday, Friday 10/16/23   Anner Alm ORN, MD  RESTASIS  0.05 % ophthalmic emulsion Place 1 drop into both eyes 2 (two) times daily.  02/27/19   [provider]  sodium chloride  HYPERTONIC 3 % nebulizer solution Take by nebulization as needed for other. 02/27/24   Shelah Lamar RAMAN, MD  SPIRIVA  HANDIHALER 18 MCG inhalation capsule Place 1 capsule (18 mcg total) into inhaler and inhale daily. 02/06/24   Shelah Lamar RAMAN, MD  VASCEPA  1 g capsule Take 1 capsule (1 g total) by mouth 2 (two) times daily. 08/11/23   Anner Alm ORN, MD    Allergies: Ciprofloxacin , Aspirin , Crestor [rosuvastatin], Ibuprofen, Wellbutrin [bupropion], Amitiza  [lubiprostone ], Penicillin g, Prednisone , Sulfonamide derivatives, Zoloft  [sertraline hcl], Albuterol , and Lipitor [atorvastatin ]    Review of Systems  Updated Vital Signs  Vitals:   03/17/24 1927 03/17/24 1930 03/17/24 1930 03/17/24 1934  BP:   136/82   Pulse:  72    Resp:  14    Temp:    98 F (36.7 C)  TempSrc:    Oral  SpO2:  100%    Weight: 60 kg     Height: 5' 5 (1.651 m)        Physical Exam Vitals and nursing Michele [name] reviewed.  Constitutional:      General: She is not in acute distress. HENT:     Head: Normocephalic and atraumatic.     Comments: No obvious contusion to posterior scalp Eyes:     Extraocular Movements: Extraocular movements intact.  Cardiovascular:     Rate and Rhythm: Normal rate and regular rhythm.     Heart sounds: Normal heart sounds.  Pulmonary:     Effort: Pulmonary effort is normal.     Breath sounds: Normal breath sounds.     Comments: Trace wheeze Abdominal:     Palpations: Abdomen is soft.     Tenderness: There is no abdominal tenderness. There is no guarding.  Musculoskeletal:     Cervical back: Normal range of motion. No tenderness.     Comments: Moves all extremities spontaneously without difficulty No bony tenderness to palpation of pelvis or bilateral lower extremities, full active ROM  Skin:    General: Skin is warm and dry.     Comments: Bruising noted to dorsal aspect of R hand  Neurological:     General: No focal deficit present.     Mental Status: She is alert and oriented to person, place, and time.     Cranial Nerves: No cranial nerve deficit.     (all labs ordered are listed, but only abnormal results are displayed) Labs Reviewed  COMPREHENSIVE METABOLIC PANEL WITH GFR - Abnormal; Notable for the following components:      Result Value   Total Protein 6.2 (*)    All other components within normal limits  CBC  ETHANOL  PROTIME-INR  URINALYSIS, ROUTINE W REFLEX MICROSCOPIC  I-STAT CHEM 8, ED  I-STAT CG4 LACTIC ACID, ED  SAMPLE TO BLOOD BANK    EKG: None  Radiology: CT HEAD WO  CONTRAST Result Date: 03/17/2024 EXAM: CT HEAD AND CERVICAL SPINE 03/17/2024 08:17:00 PM TECHNIQUE: CT of the head and cervical spine was performed without the administration of intravenous contrast. Multiplanar reformatted images are provided for review. Automated exposure control, iterative reconstruction, and/or weight based adjustment of the mA/kV was utilized to reduce the radiation dose to as low as reasonably achievable. COMPARISON: CT head and c-spine 07/16/2014. CLINICAL HISTORY: Head trauma, moderate-severe. Fall. Mechanical fall and fell backyard and hit back of head on head.  Pt is on blood thinners. Pain in head 6/10. Also complains of SOB - hx of COPD and Emphysema. FINDINGS: CT HEAD BRAIN AND VENTRICLES: Patchy and confluent areas of decreased attenuation are noted throughout the deep and periventricular white matter of the cerebral hemispheres bilaterally suggestive of chronic microvascular ischemic changes. Diffuse cerebral and cerebellar volume loss. Atherosclerotic calcifications are present within the cavernous internal carotid arteries. No acute intracranial hemorrhage. No mass effect or midline shift. No abnormal extra-axial fluid collection. No evidence of acute infarct. No hydrocephalus. ORBITS: No acute abnormality. SINUSES AND MASTOIDS: No acute abnormality. SOFT TISSUES AND SKULL: No acute skull fracture. No acute soft tissue abnormality. CT CERVICAL SPINE BONES AND ALIGNMENT: Multilevel moderate degenerative changes of the spine. No severe osseous neural foraminal or central canal stenosis. Diffusely decreased bone density. No acute fracture or traumatic malalignment. SOFT TISSUES: Atherosclerotic plaque of the carotid arteries within the neck. No prevertebral soft tissue swelling. LUNG APICES: Severe emphysematous changes. IMPRESSION: 1. No acute intracranial abnormality. 2. No acute fracture or traumatic malalignment of the cervical spine. Electronically signed by: Kate Plummer MD  03/17/2024 08:24 PM EDT RP Workstation: [id number]   CT Cervical Spine Wo Contrast Result Date: 03/17/2024 EXAM: CT HEAD AND CERVICAL SPINE 03/17/2024 08:17:00 PM TECHNIQUE: CT of the head and cervical spine was performed without the administration of intravenous contrast. Multiplanar reformatted images are provided for review. Automated exposure control, iterative reconstruction, and/or weight based adjustment of the mA/kV was utilized to reduce the radiation dose to as low as reasonably achievable. COMPARISON: CT head and c-spine 07/16/2014. CLINICAL HISTORY: Head trauma, moderate-severe. Fall. Mechanical fall and fell backyard and hit back of head on head. Pt is on blood thinners. Pain in head 6/10. Also complains of SOB - hx of COPD and Emphysema. FINDINGS: CT HEAD BRAIN AND VENTRICLES: Patchy and confluent areas of decreased attenuation are noted throughout the deep and periventricular white matter of the cerebral hemispheres bilaterally suggestive of chronic microvascular ischemic changes. Diffuse cerebral and cerebellar volume loss. Atherosclerotic calcifications are present within the cavernous internal carotid arteries. No acute intracranial hemorrhage. No mass effect or midline shift. No abnormal extra-axial fluid collection. No evidence of acute infarct. No hydrocephalus. ORBITS: No acute abnormality. SINUSES AND MASTOIDS: No acute abnormality. SOFT TISSUES AND SKULL: No acute skull fracture. No acute soft tissue abnormality. CT CERVICAL SPINE BONES AND ALIGNMENT: Multilevel moderate degenerative changes of the spine. No severe osseous neural foraminal or central canal stenosis. Diffusely decreased bone density. No acute fracture or traumatic malalignment. SOFT TISSUES: Atherosclerotic plaque of the carotid arteries within the neck. No prevertebral soft tissue swelling. LUNG APICES: Severe emphysematous changes. IMPRESSION: 1. No acute intracranial abnormality. 2. No acute fracture or traumatic  malalignment of the cervical spine. Electronically signed by: Morgane Naveau MD 03/17/2024 08:24 PM EDT RP Workstation: [id number]   DG Hand Complete Right Result Date: 03/17/2024 EXAM: 3 or more VIEW(S) XRAY OF THE RIGHT HAND 03/17/2024 07:51:00 PM COMPARISON: None available. CLINICAL HISTORY: fall. FINDINGS: BONES AND JOINTS: No acute fracture. No focal osseous lesion. No joint dislocation. SOFT TISSUES: The soft tissues are unremarkable. IMPRESSION: 1. No acute osseous abnormality. Electronically signed by: Pinkie Pebbles MD 03/17/2024 07:56 PM EDT RP Workstation: [id number]   DG Chest Port 1 View Result Date: 03/17/2024 EXAM: 1 VIEW XRAY OF THE CHEST 03/17/2024 07:44:00 PM COMPARISON: cxr 02/05/24. CLINICAL HISTORY: Trauma. Mechanical fall. FINDINGS: LUNGS AND PLEURA: Hyperinflation of the lungs. Bullous changes of the right apex. No overt pulmonary edema. Chronic  coarsened interstitial markings. No focal consolidation. No pleural effusion. No pneumothorax. HEART AND MEDIASTINUM: No acute abnormality of the cardiac and mediastinal silhouettes. BONES AND SOFT TISSUES: No acute osseous abnormality. VASCULATURE: Atherosclerotic plaque. Coronary artery stents. IMPRESSION: 1. No acute cardiopulmonary process. 2. Emphysema. 3. Atherosclerotic plaque. Electronically signed by: Morgane Naveau MD 03/17/2024 07:50 PM EDT RP Workstation: [id number]     Procedures   Medications Ordered in the ED  morphine  (PF) 4 MG/ML injection 4 mg (4 mg Intravenous Given 03/17/24 2015)  ondansetron  (ZOFRAN ) injection 4 mg (4 mg Intravenous Given 03/17/24 2015)                                    Medical Decision Making This patient presents to the ED for concern of fall, this involves an extensive number of treatment options, and is a complaint that carries with it a high risk of complications and morbidity.  The differential diagnosis includes fracture, dislocation, contusion, concussion, intracranial  hemorrhage   Co morbidities that complicate the patient evaluation  On clopidogrel    Additional history obtained:  Additional history obtained from record review External records from outside source obtained and reviewed including previous pulmonology Michele [name]   Lab Tests:  I Ordered, and personally interpreted labs.  The pertinent results include: CBC is within normal limits.  CMP is unremarkable.  Lactic acid within normal limits.  Ethanol level undetectable.  PT/INR within normal limits.   Imaging Studies ordered:  I ordered imaging studies including CXR, R hand XR, CT head, CT C-spine  I independently visualized and interpreted imaging which showed  - CT head and C-spine: 1. No acute intracranial abnormality. 2. No acute fracture or traumatic malalignment of the cervical spine. - CXR: 1. No acute cardiopulmonary process. 2. Emphysema. 3. Atherosclerotic plaque. - XR R hand: 1. No acute osseous abnormality. I agree with the radiologist interpretation   Cardiac Monitoring: / EKG:  The patient was maintained on a cardiac monitor.  I personally viewed and interpreted the cardiac monitored which showed an underlying rhythm of: NSR   Problem List / ED Course / Critical interventions / Medication management  I ordered medication including morphine  for pain and Zofran  for nausea Reevaluation of the patient after these medicines showed that the patient improved I have reviewed the patients home medicines and have made adjustments as needed   Social Determinants of Health:  Tobacco use   Test / Admission - Considered:  Patient brought in as a level 2 trauma for a fall on thinners.  At time my initial assessment patient is alert and oriented, is in no acute distress, complains of a headache from where she struck her head against a coffee table during her fall.  She tells me that she uses a walker at home to ambulate, she has difficulty getting around due to chronic bilateral knee  pain.  Was seen by orthopedics previously and recommended gel injections, however she declined.  She has not been in physical therapy but is concerned about her ability to get around the house safely. Patient received morphine  for headache and Zofran  for nausea. Labs and imaging are reassuring as above.  Patient was able to sleep for a period of time, when I woke the patient up she reports that her headache had completely resolved, she is feeling back to her baseline.  Patient was able to eat/drink without difficulty, she was able to ambulate with the  assistance of a Water engineer with the use of a walker.  She ambulates at home with a walker or wheelchair.  I recommend that she seek follow-up with her orthopedic specialist in regard to her chronic bilateral knee pain, as they may be able to have her set up with physical therapy to work on her strength/ambulation.  She voiced understanding and is in agreement this plan.  Return precautions discussed, she is appropriate for discharge at this time. Staffed with Dr. Francesca    Amount and/or Complexity of Data Reviewed Labs: ordered. Radiology: ordered.  Risk Prescription drug management.        Final diagnoses:  Fall, initial encounter    ED Discharge Orders     None          Glendia Rocky LOISE DEVONNA 03/17/24 2257    Francesca Elsie CROME, MD 03/17/24 2340

## 2024-03-17 NOTE — ED Triage Notes (Signed)
 PT BIB EMS from home. Mechanical fall and fell backyard and hit back of head on head. Pt is on blood thinners. No lacerations, bleeding or bumps to back of head. Pain in head 6/10 Also complains of SOB-  hx of COPD and Emphysema  EMS vitals: 158/82 82  99% ra 101 cbg

## 2024-03-18 ENCOUNTER — Ambulatory Visit: Payer: Self-pay | Admitting: Emergency Medicine

## 2024-03-18 NOTE — Telephone Encounter (Signed)
 Pulm patient with blood in urine. Patient has non cone PCP Advised that she call her PCP or go to ED or UC Patient states that she will not go back to ED   FYI Only or Action Required?: Action required by provider: update on patient condition.  Patient is followed in Pulmonology for emphysema, last seen on 03/10/2024 by Kara Dorn NOVAK, MD.  Called Nurse Triage reporting Hematuria and Dizziness.  Symptoms began today.  Interventions attempted: Nothing.  Symptoms are: unchanged.  Triage Disposition: See HCP Within 4 Hours (Or PCP Triage)  Patient/caregiver understands and will follow disposition?: unsure  Copied from CRM [id number]. Topic: Clinical - Red Word Triage >> Mar 18, 2024 12:17 PM Nathanel DEL wrote: Red Word that prompted transfer to Nurse Triage: pt went to ED last night b/c she fell and hit her head.  Pt is still dizzy, and went to restroom and had blood in her urine.  Pt seems a little off, just thought she sounds a little off. Reason for Disposition  Taking Coumadin (warfarin) or other strong blood thinner, or known bleeding disorder (e.g., thrombocytopenia)  Answer Assessment - Initial Assessment Questions 1. COLOR of URINE: Describe the color of the urine.  (e.g., tea-colored, pink, red, bloody) Do you have blood clots in your urine? (e.g., none, pea, grape, small coin)     Just before call, urine was regular colored with a drop of blood 2. ONSET: When did the bleeding start?      Just before time of call 3. EPISODES: How many times has there been blood in the urine? or How many times today?     One other time a week ago 4. PAIN with URINATION: Is there any pain with passing your urine? If Yes, ask: How bad is the pain?  (Scale 1-10; or mild, moderate, severe)     denies 5. FEVER: Do you have a fever? If Yes, ask: What is your temperature, how was it measured, and when did it start?     denies 6. ASSOCIATED SYMPTOMS: Are you passing urine more  frequently than usual?     denies 7. OTHER SYMPTOMS: Do you have any other symptoms? (e.g., back/flank pain, abdomen pain, vomiting)     Back pain present for years States that she has seen urologist in the past for urinary issues 8. PREGNANCY: Is there any chance you are pregnant? When was your last menstrual period?     N/a  Protocols used: Urine - Blood In-A-AH

## 2024-03-18 NOTE — Telephone Encounter (Signed)
 Patient does not need to change any of her medications. Agree with going to the hospital for further evaluation of the bleeding.  Sorry she is going through this on her birthday.  JD

## 2024-03-18 NOTE — Telephone Encounter (Signed)
 Called and spoke with Michele [name] [facility]) Pt fell 10/15 and hit her head, called ambulance to take her to ED. Scans were done and pt was discharged. Pt states she had blood (size of a penny) in urine earlier today. She does have hemorrhoids, pt is incontinent and there is blood in her brief as well. Pt contacted PCP and was scheduled for appt 10/28.  Pt has no breathing issues.  I advised thomas to take pt to ED if blood amount increases. Michele [name] is requesting if there is any medications she needs to stop taking or new medications for this.  Dr. Kara pt last seen you, can you please advise

## 2024-03-19 NOTE — Addendum Note (Signed)
 Addended byBETHA FRIES, Loy Little A on: 03/19/2024 01:22 PM   Modules accepted: Orders

## 2024-03-19 NOTE — Telephone Encounter (Addendum)
 Called and spoke with [name]) he states pt has ear irritation from her hearing aide. I have placed referral to ENT and pts friend is also aware of RB's recommendations for nebulizer medications.  Nothing further needed.

## 2024-03-19 NOTE — Telephone Encounter (Signed)
 Okay for her to stop the 3% saline nebs, continue to use the Xopenex  nebs. Okay to refer her to ENT if she wishes.  You will probably need to ask her what the consultation question is (what symptoms is she having?)

## 2024-03-30 ENCOUNTER — Ambulatory Visit: Admitting: Primary Care

## 2024-03-30 DIAGNOSIS — R7303 Prediabetes: Secondary | ICD-10-CM | POA: Diagnosis not present

## 2024-03-30 DIAGNOSIS — K5900 Constipation, unspecified: Secondary | ICD-10-CM | POA: Diagnosis not present

## 2024-03-30 DIAGNOSIS — I2510 Atherosclerotic heart disease of native coronary artery without angina pectoris: Secondary | ICD-10-CM | POA: Diagnosis not present

## 2024-03-30 DIAGNOSIS — F17200 Nicotine dependence, unspecified, uncomplicated: Secondary | ICD-10-CM | POA: Diagnosis not present

## 2024-03-30 DIAGNOSIS — C3490 Malignant neoplasm of unspecified part of unspecified bronchus or lung: Secondary | ICD-10-CM | POA: Diagnosis not present

## 2024-03-30 DIAGNOSIS — E7800 Pure hypercholesterolemia, unspecified: Secondary | ICD-10-CM | POA: Diagnosis not present

## 2024-03-30 DIAGNOSIS — Z0000 Encounter for general adult medical examination without abnormal findings: Secondary | ICD-10-CM | POA: Diagnosis not present

## 2024-03-30 DIAGNOSIS — J449 Chronic obstructive pulmonary disease, unspecified: Secondary | ICD-10-CM | POA: Diagnosis not present

## 2024-04-03 DIAGNOSIS — J439 Emphysema, unspecified: Secondary | ICD-10-CM | POA: Diagnosis not present

## 2024-04-03 DIAGNOSIS — J471 Bronchiectasis with (acute) exacerbation: Secondary | ICD-10-CM | POA: Diagnosis not present

## 2024-04-08 ENCOUNTER — Telehealth: Payer: Self-pay | Admitting: Cardiology

## 2024-04-08 NOTE — Telephone Encounter (Signed)
 Spoke with patient and shared lab results from August 2025:  Blood counts look good and stable.  Hemoglobin to 4.9.  Liver functions look good   Cholesterol looks outstanding on Repatha  with LDL 41 and triglyceride 103.   Stable from a year ago.  Your lipid meds are working well.   Michele [name]   Patient verbalized understanding and expressed appreciation for call.

## 2024-04-08 NOTE — Telephone Encounter (Signed)
Patient called to follow-up on her lab test results.

## 2024-04-09 ENCOUNTER — Telehealth: Payer: Self-pay | Admitting: Cardiology

## 2024-04-09 NOTE — Telephone Encounter (Signed)
 Pt c/o medication issue:  1. Name of Medication:     clopidogrel  (PLAVIX ) 75 MG tablet  - 3 times a week in the morning pravastatin  (PRAVACHOL ) 80 MG tablet  - 3 times week in the evening  2. How are you currently taking this medication (dosage and times per day)?      3. Are you having a reaction (difficulty breathing--STAT)?   4. What is your medication issue?   Patient is concerned she should be taking less of these medications.  Patient noted the Pravastatin  medication makes her legs and feet hurt.

## 2024-04-09 NOTE — Telephone Encounter (Signed)
 Pt reporting legs/feet hurt/cramping.  But does report this is/has been ongoing. Had to repeat education several times on what each medication does  (she thought Clopidogrel  and Plavix  were 2 different medications). She thinks that b/c she is taking a shot for cholesterol she could possibly stop the oral medication.  Aware that I will forward  to provider for review. Aware it may be next week before hearing back from the office.

## 2024-04-10 NOTE — Telephone Encounter (Signed)
 he still has multivessel coronary disease and probably PAD.  Hence the reason for her being on Plavix /clopidogrel .  She is not to be taking it every day.  Plavix  is not causing her joint pain.  She has been on it for years.  We reduced the dose to help with the bruising.  The injection for her lipids is to control one of the other risk factors for coronary disease which is high cholesterol levels..  Other risk factors are high blood pressure which she does not have, smoking which she has done and has had issues stopping, as well as diabetes-which she does not have.  Alm Clay, MD .

## 2024-04-26 ENCOUNTER — Telehealth: Payer: Self-pay | Admitting: Primary Care

## 2024-04-26 ENCOUNTER — Telehealth: Payer: Self-pay | Admitting: Pharmacy Technician

## 2024-04-26 ENCOUNTER — Encounter: Payer: Self-pay | Admitting: Primary Care

## 2024-04-26 ENCOUNTER — Other Ambulatory Visit (HOSPITAL_COMMUNITY): Payer: Self-pay

## 2024-04-26 ENCOUNTER — Ambulatory Visit (INDEPENDENT_AMBULATORY_CARE_PROVIDER_SITE_OTHER)

## 2024-04-26 ENCOUNTER — Ambulatory Visit: Admitting: Primary Care

## 2024-04-26 VITALS — BP 126/62 | HR 84 | Temp 97.6°F | Ht 60.0 in | Wt 135.8 lb

## 2024-04-26 DIAGNOSIS — C3491 Malignant neoplasm of unspecified part of right bronchus or lung: Secondary | ICD-10-CM

## 2024-04-26 DIAGNOSIS — F17210 Nicotine dependence, cigarettes, uncomplicated: Secondary | ICD-10-CM

## 2024-04-26 DIAGNOSIS — J449 Chronic obstructive pulmonary disease, unspecified: Secondary | ICD-10-CM | POA: Diagnosis not present

## 2024-04-26 DIAGNOSIS — F17200 Nicotine dependence, unspecified, uncomplicated: Secondary | ICD-10-CM

## 2024-04-26 MED ORDER — BREZTRI AEROSPHERE 160-9-4.8 MCG/ACT IN AERO: 2.0000 | INHALATION_SPRAY | Freq: Two times a day (BID) | RESPIRATORY_TRACT | Status: AC

## 2024-04-26 MED ORDER — PROMETHAZINE-DM 6.25-15 MG/5ML PO SYRP: 5.0000 mL | ORAL_SOLUTION | Freq: Every evening | ORAL | 0 refills | Status: AC | PRN

## 2024-04-26 NOTE — Patient Instructions (Signed)
  VISIT SUMMARY: Today, we discussed your worsening breathing symptoms and your desire to quit smoking. We reviewed your current medications and symptoms, including your chronic cough and recent shoulder and chest pain. We also talked about your smoking history and previous attempts to quit. A new inhaler was provided for you to try, and we discussed a plan to gradually reduce your cigarette consumption.  YOUR PLAN: -CHRONIC OBSTRUCTIVE PULMONARY DISEASE (COPD) WITH EMPHYSEMA AND CHRONIC COUGH WITH SPUTUM PRODUCTION: COPD is a chronic lung disease that makes it hard to breathe, and emphysema is a type of COPD that damages the air sacs in your lungs. Your breathing has worsened, and you have a chronic cough with mucus. We provided you with a sample of the Breztri  inhaler to replace your current inhalers (Arnuity and Spiriva ) for two weeks. We also prescribed promethazine  DM to help with your nighttime cough and to aid your sleep. A chest x-ray has been ordered to check for any new changes or pneumonia.  -TOBACCO USE DISORDER (NICOTINE  DEPENDENCE, CIGARETTES): Nicotine  dependence means you are addicted to the nicotine  in cigarettes. You currently smoke half a pack a day and want to quit. We discussed reducing your cigarette consumption by one cigarette per week, aiming for eight cigarettes per day by your next follow-up. We will check with your pharmacy about insurance coverage for the Nicotrol  inhaler and discuss it with Doctor Ruther at your next appointment. Chantix was discussed but you prefer not to take it due to potential side effects.  -LEFT SHOULDER AND CHEST PAIN, LIKELY MUSCULOSKELETAL: Your left shoulder and chest pain is likely related to your muscles and bones rather than your lungs or heart. The pain worsens when you lie down. We ordered a chest x-ray to check for any new changes or pneumonia.  INSTRUCTIONS: Please follow up with us  after you have tried the Breztri  inhaler for two weeks.  Decrease your cigarette consumption by one cigarette per week, aiming for eight cigarettes per day by your next follow-up. We will check with your pharmacy regarding insurance coverage for the Nicotrol  inhaler and discuss it with Doctor Ruther at your next appointment. A chest x-ray has been ordered to evaluate for any new changes or pneumonia. Please schedule a follow-up appointment to review your progress and any new findings.  Follow-up Keep apt on DEC 12th with Dr. Shelah to discuss nicotrol  inhaler, resuming abx for chronic bronchitis and assess how you are doing on Breztri  [employer]
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
## 2024-04-26 NOTE — Telephone Encounter (Signed)
 Is nicotrol  inhaler covered?

## 2024-04-26 NOTE — Telephone Encounter (Signed)
 Pharmacy Patient Advocate Encounter   Received notification from Fax that prior authorization for repatha  is required/requested.   Insurance verification completed.   The patient is insured through [employer].   Per test claim: PA required; PA submitted to above mentioned insurance via Latent Key/confirmation #/EOC [id number] Status is pending

## 2024-04-26 NOTE — Telephone Encounter (Signed)
 Pharmacy Patient Advocate Encounter  Received notification from [facility] that Prior Authorization for repatha  has been APPROVED from 04/26/24 to 06/02/25. Ran test claim, Copay is $0.00- 3 months. This test claim was processed through [facility]- copay amounts may vary at other pharmacies due to pharmacy/plan contracts, or as the patient moves through the different stages of their insurance plan.   PA #/Case ID/Reference #: [id number]

## 2024-04-27 ENCOUNTER — Other Ambulatory Visit (HOSPITAL_BASED_OUTPATIENT_CLINIC_OR_DEPARTMENT_OTHER): Payer: Self-pay

## 2024-04-27 ENCOUNTER — Ambulatory Visit: Payer: Self-pay | Admitting: Primary Care

## 2024-04-27 MED ORDER — AZITHROMYCIN 250 MG PO TABS: ORAL_TABLET | ORAL | 0 refills | Status: AC

## 2024-04-27 NOTE — Telephone Encounter (Signed)
 Thanks, I knew there was something. Sorry about that   Triage please let patient know nicotrol  inhaler is no longer available, discuss alternatives such as Wellbutrin ro chantix with Dr. Shelah at follow-up

## 2024-04-27 NOTE — Telephone Encounter (Signed)
 Called and spoke with Debby [facility]) and advised of Beth's note.  Pts friend is aware and will be discussed at ov 12/12. Nothing further needed.

## 2024-04-27 NOTE — Telephone Encounter (Signed)
Prescription for azithromycin sent.

## 2024-04-27 NOTE — Addendum Note (Signed)
 Addended by: SHELAH LAMAR RAMAN on: 04/27/2024 11:29 AM   Modules accepted: Orders

## 2024-04-27 NOTE — Telephone Encounter (Signed)
 FYI Only or Action Required?: Action required by provider: clinical question for provider and update on patient condition.  Patient is followed in Pulmonology for COPD, last seen on 04/26/2024 by Hope Almarie ORN, NP.  Called Nurse Triage reporting Medication Problem.  Triage Disposition: Information or Advice Only Call  Patient/caregiver understands and will follow disposition?: Yes  Copied from CRM #[id number]. Topic: Clinical - Medical Advice >> Apr 27, 2024 10:10 AM Michele [name] wrote: Reason for CRM: patient states she was prescribed promethazine -dextromethorphan (PROMETHAZINE -DM) 6.25-15 MG/5ML syrup but her insurance will not cover it - she states she has some delsym kids (4+) and mucinex  at home, wants to know if she can take that for her cough - also states she has bronchitis and wants to have [name] z-pak prescribed - also states she is constipated and wants to confirm she can take miralax  for it Reason for Disposition  Health information question, no triage required and triager able to answer question  Answer Assessment - Initial Assessment Questions 1. REASON FOR CALL: What is the main reason for your call? or How can I best help you?  Returned patient's call and informed her okay to take OTC delsym and mucinex  while waiting to hear from Almarie Hope, NP regarding rx change due to promethazine  not being covered by insurance. Also reassured her that I would send her request about [name] Zpak. Pt questioned medication instructions for budesonide -glycopyrrolate -formoterol  inhaler. Per chart, discussed provider instructed rx be taken 2 puffs in am and at bedtime. Pt voiced understanding but questioned safety of medication with preexisting medical history. Discussed that her provider would be aware of her medical conditions when prescribing rx but if she felt the danger outweighed the benefit of medication, that was her choice. Pt voiced understanding and repeated instructions back to RN.  She  questioned if she could take Miralax  for constipation with those medication and reassured her that she could. Pt voiced understanding and appreciation.  Protocols used: Information Only Call - No Triage-[name]-AH

## 2024-04-27 NOTE — Telephone Encounter (Signed)
 Noted.  Nothing further needed.  Results from CXR from OV will be called to patient.

## 2024-04-27 NOTE — Progress Notes (Signed)
 Please let patient know CXR showed no acute process such as pneumonia or pleural effusion. She has emphysema, tortuous aorta with coronary artery disease (FU with cardiology recommend or we can refer her if she doesn't have one) and kyphosis of the spine and osteopenia (reduced bone mass).

## 2024-05-04 ENCOUNTER — Telehealth: Payer: Self-pay | Admitting: *Deleted

## 2024-05-04 NOTE — Telephone Encounter (Signed)
 Please advise:  Copied from CRM #[id number]. Topic: Clinical - Medical Advice >> Apr 28, 2024 12:52 PM Essie A wrote: Reason for CRM: Patient wants to know if it is okay to take Mucinex  and Miralax  with the  azithromycin  (ZITHROMAX ) 250 MG tablet and her 2 inhaler.  Please call her at [phone].  Thanks

## 2024-05-05 NOTE — Telephone Encounter (Signed)
 Patient is calling to find out about taking all these medications: Arnuity, Spiriva , probiotics and other medications. She keeps adding on other medications that she's taking.  Please return her call at [phone].  Thanks.

## 2024-05-05 NOTE — Telephone Encounter (Signed)
 Yes, ok to take all of those medications together. The azithromycin  may speed up her GI tract, so she should back of miralax  if her bowel movements increase while on Zpak.  Michele [name]

## 2024-05-05 NOTE — Telephone Encounter (Signed)
 Spoke with Debby okay per DPR - relay message     -NFN

## 2024-05-06 ENCOUNTER — Telehealth: Payer: Self-pay | Admitting: Cardiology

## 2024-05-06 ENCOUNTER — Other Ambulatory Visit (HOSPITAL_COMMUNITY): Payer: Self-pay

## 2024-05-06 DIAGNOSIS — I2089 Other forms of angina pectoris: Secondary | ICD-10-CM

## 2024-05-06 MED ORDER — NITROGLYCERIN 0.4 MG SL SUBL: 0.4000 mg | SUBLINGUAL_TABLET | SUBLINGUAL | 0 refills | Status: AC | PRN

## 2024-05-06 MED ORDER — REPATHA SURECLICK 140 MG/ML ~~LOC~~ SOAJ
140.0000 mg | SUBCUTANEOUS | 3 refills | Status: AC
  Filled 2024-05-06: qty 6, 84d supply, fill #0

## 2024-05-06 MED ORDER — PRAVASTATIN SODIUM 80 MG PO TABS: ORAL_TABLET | ORAL | 3 refills | Status: AC

## 2024-05-06 NOTE — Telephone Encounter (Signed)
*  STAT* If patient is at the pharmacy, call can be transferred to refill team.   1. Which medications need to be refilled? (please list name of each medication and dose if known)  Evolocumab  (REPATHA  SURECLICK) 140 MG/ML SOAJ    2. Would you like to learn more about the convenience, safety, & potential cost savings by using the [facility]? no   3. Are you open to using the [facility] (Type [facility]. no   4. Which pharmacy/location (including street and city if local pharmacy) is medication to be sent to?  [id number] - [place], [place] - [address] AT [facility] OF [address]    5. Do they need a 30 day or 90 day supply?

## 2024-05-06 NOTE — Telephone Encounter (Signed)
 Requested Prescriptions   Signed Prescriptions Disp Refills   nitroGLYCERIN  (NITROSTAT ) 0.4 MG SL tablet 25 tablet 0    Sig: Place 1 tablet (0.4 mg total) under the tongue every 5 (five) minutes as needed for chest pain.    Authorizing Provider: ANNER ALM ORN    Ordering User: WILFRED, Marlyn Rabine  C   pravastatin  (PRAVACHOL ) 80 MG tablet 45 tablet 3    Sig: Take 1 tablet by mouth every Monday, Wednesday, Friday    Authorizing Provider: ANNER ALM ORN    Ordering User: WILFRED, Braddock Servellon  C

## 2024-05-06 NOTE — Telephone Encounter (Signed)
 ATC x1.  Left detailed message per DPR.  She was to use Breztri  for 2 weeks, during this 2 weeks she was to stop using the Arnuity and Spririva.  She has a f/u on 12/12 with our office.  Advised to return our call.

## 2024-05-06 NOTE — Telephone Encounter (Signed)
*  STAT* If patient is at the pharmacy, call can be transferred to refill team.   1. Which medications need to be refilled? (please list name of each medication and dose if known) nitroGLYCERIN  (NITROSTAT ) 0.4 MG SL tablet    pravastatin  (PRAVACHOL ) 80 MG tablet    2. Would you like to learn more about the convenience, safety, & potential cost savings by using the [facility]? no   3. Are you open to using the [facility] (Type [facility]. no   4. Which pharmacy/location (including street and city if local pharmacy) is medication to be sent to? [id number] - [place], [place] - [address] AT [facility] [address]     5. Do they need a 30 day or 90 day supply?

## 2024-05-07 ENCOUNTER — Telehealth: Payer: Self-pay | Admitting: *Deleted

## 2024-05-07 NOTE — Telephone Encounter (Signed)
 Patient states this has been taken care of. Reminded of appt  on 12/12 @ 11:15. NFN  Copied from CRM [id number]. Topic: Clinical - Medication Question >> May 05, 2024  1:57 PM Michele [name] wrote: Reason for CRM: Patient would like to speak to Dr.Byrum's nurse in regard to questions about her medications - patient states her phone does not ring for [name] call back.   Another encounter is open for the same question, patient keeps asking about new medications.  Callback number: [phone] >> May 06, 2024  3:10 PM Michele [name] wrote: Patient states the Breztri  makes her heart race and she would like to discuss this with [name] nurse.  >> May 06, 2024 10:07 AM Michele [name] wrote: Pt calling about her med refill of the Arnuity Ellipta  advised her it was discontinued and replaced with the Breztri  inhaler and pt stated she can not take the Breztri  inhaler due to how it makes her heart feel. Please reach out to pt

## 2024-05-10 ENCOUNTER — Telehealth: Payer: Self-pay | Admitting: Cardiology

## 2024-05-10 NOTE — Telephone Encounter (Signed)
 Chest x-ray just simply shows emphysema right.  No acute process like pneumonia or edema.  Just exactly what was said.

## 2024-05-10 NOTE — Telephone Encounter (Signed)
 See note from pulmonary regarding recent CXR:    Almarie LELON Ferrari, NP 04/27/2024 10:10 AM EST     Please let patient know CXR showed no acute process such as pneumonia or pleural effusion. She has emphysema, tortuous aorta with coronary artery disease (FU with cardiology recommend or we can refer her if she doesn't have one) and kyphosis of the spine and osteopenia (reduced bone mass).

## 2024-05-10 NOTE — Telephone Encounter (Signed)
 Pt called in stating she was informed to call Dr. Genice office about her most recent chest xray. Please advise if Dr. Anner can review and give recommendations.

## 2024-05-10 NOTE — Progress Notes (Signed)
 I called and spoke to pt. Pt informed of Beth's note and pt verbalized understanding. NFN

## 2024-05-11 NOTE — Telephone Encounter (Signed)
 I called and spoke with patient regarding her inhalers.  She stated that she did not tolerate the Breztri , it made her heart race.  She went back to taking her Arnuity and spiriva .  I advised she could discuss this with the doctor at her upcoming appointment on 12/12.  She verbalized understanding.  While she was on the phone, she mentioned that someone called her caregiver regarding the cxr and told her to get in touch with her heart doctor.  I told her that it looks like Almarie sent a message to her cardiologist and he has reviewed her message.  I told her it looks like the nurse at the cardiologist office tried to reach her and left a VM for her to call back.  She stated she called back and could not talk to anyone.  I told her that Almarie said it was not urgent and that they would call her back.  Nothing further needed.

## 2024-05-11 NOTE — Telephone Encounter (Signed)
 I called and spoke with patient regarding the inhalers.  She states the Breztri  made her heart race so she went back to the Arnuity and

## 2024-05-11 NOTE — Telephone Encounter (Signed)
 Left message to call back.

## 2024-05-14 ENCOUNTER — Ambulatory Visit: Admitting: Emergency Medicine

## 2024-05-14 ENCOUNTER — Encounter: Payer: Self-pay | Admitting: Emergency Medicine

## 2024-05-14 VITALS — BP 128/58 | HR 71 | Temp 98.5°F | Ht 61.0 in | Wt 136.8 lb

## 2024-05-14 DIAGNOSIS — C3491 Malignant neoplasm of unspecified part of right bronchus or lung: Secondary | ICD-10-CM | POA: Diagnosis not present

## 2024-05-14 DIAGNOSIS — J439 Emphysema, unspecified: Secondary | ICD-10-CM

## 2024-05-14 DIAGNOSIS — F17210 Nicotine dependence, cigarettes, uncomplicated: Secondary | ICD-10-CM

## 2024-05-14 DIAGNOSIS — J449 Chronic obstructive pulmonary disease, unspecified: Secondary | ICD-10-CM

## 2024-05-14 MED ORDER — LEVALBUTEROL HCL 0.63 MG/3ML IN NEBU: 0.6300 mg | INHALATION_SOLUTION | RESPIRATORY_TRACT | 11 refills | Status: AC | PRN
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
## 2024-05-14 NOTE — Assessment & Plan Note (Signed)
 Serial imaging and follow-up planned with Dr. Sherrod.

## 2024-05-14 NOTE — Patient Instructions (Signed)
 Please stop Breztri  Continue Spiriva  and Arnuity as you have been taking them.  Rinse and gargle after using the Arnuity. We will try starting a new inhaled nebulized medication called Ohtuvayre .  You will take this twice a day.  We will work on getting this for you through specialty pharmacy We will hold off on daily azithromycin  since you have upset stomach with this medication We will hold off on daily prednisone  since you have side effects from this medication Keep levalbuterol  (Xopenex ) available to use up to every 6 hours if needed for shortness of breath, chest tightness, wheezing.  We will send a prescription for the nebulizer version of this so you can have both the inhaler and the nebulizer available. We will perform a walking oximetry today.  If you qualify for oxygen  we will order this for you. You need to work hard on decreasing your cigarettes.  Ultimate goal will be to stop altogether. Follow in our office in 3 months so we can see how you are doing on the Ohtuvayre .

## 2024-05-14 NOTE — Assessment & Plan Note (Signed)
 As had been the case before she was unable to tolerate the LABA component of Breztri .  She went back to the Spiriva  and Arnuity.  She has Xopenex  available to use as needed, wants the nebulized version and I will refill this for her.  We talked about options for therapy including daily azithromycin , daily prednisone .  She tolerates both of these poorly due to side effects.  I think it would be reasonable to try Ohtuvayre  to see if she gets benefit from this.  We also need to rule out occult hypoxemia as a contributor to her dyspnea.  Did also underscore with her that she needs to stop smoking.  Please stop Breztri  Continue Spiriva  and Arnuity as you have been taking them.  Rinse and gargle after using the Arnuity. We will try starting a new inhaled nebulized medication called Ohtuvayre .  You will take this twice a day.  We will work on getting this for you through specialty pharmacy We will hold off on daily azithromycin  since you have upset stomach with this medication We will hold off on daily prednisone  since you have side effects from this medication Keep levalbuterol  (Xopenex ) available to use up to every 6 hours if needed for shortness of breath, chest tightness, wheezing.  We will send a prescription for the nebulizer version of this so you can have both the inhaler and the nebulizer available. We will perform a walking oximetry today.  If you qualify for oxygen  we will order this for you. You need to work hard on decreasing your cigarettes.  Ultimate goal will be to stop altogether. Follow in our office in 3 months so we can see how you are doing on the Ohtuvayre .

## 2024-05-17 NOTE — Telephone Encounter (Signed)
 There is no evidence of Pulmonary Edema to suggest CHF.    We already know about Aortic Calcification & CAD -- nothing new & not acute.  NO ACTIVE CARDIAC PROCESS.   She has a Development Worker, [employer] - but no CXR does NOT tell us  anything new.    DH

## 2024-05-17 NOTE — Telephone Encounter (Signed)
 Per Dr. Anner: Chest x-ray just simply shows emphysema right.  No acute process like pneumonia or edema.   Just exactly what was said.  Spoke to patient and relayed information from XR results. Pt couldn't understand why she was notified by pulmonary to seek cardiology. Again read Dr Anner results. Pt verbalizes understanding.

## 2024-05-17 NOTE — Telephone Encounter (Signed)
 Patient returned call, please call her at [phone]

## 2024-05-18 ENCOUNTER — Telehealth: Payer: Self-pay

## 2024-05-18 NOTE — Telephone Encounter (Signed)
 Received Ohtuvayre  new start paperwork. Completed form and faxed with clinicals and insurance card copy to [facility]   Phone#: [phone] Fax#: [phone]

## 2024-05-19 NOTE — Telephone Encounter (Signed)
 Received fax from [employer] confirming receipt of enrollment form.   ID [id number]

## 2024-05-20 ENCOUNTER — Encounter (INDEPENDENT_AMBULATORY_CARE_PROVIDER_SITE_OTHER): Payer: Self-pay

## 2024-05-20 NOTE — Telephone Encounter (Signed)
 Received fax from [employer] with summary of benefits. Referral form for Ohtuvayre  received. Rx will be triaged to [facility] (phone: [phone]). Once benefits investigation completed, pharmacy will reach out the patient to schedule shipment. If medication is unaffordable, patient will need to express financial hardship to be referred back to [facility] for patient assistance program pre-screening.   Patient ID: [id number]  Pharmacy phone: [facility] (phone: [phone]) [facility] Phone#: [phone]

## 2024-05-20 NOTE — Telephone Encounter (Signed)
 Received fax from [facility] confirming receipt of Rx

## 2024-05-26 NOTE — Telephone Encounter (Signed)
 Copied from CRM #[id number]. Topic: Clinical - Prescription Issue >> May 25, 2024  3:38 PM Michele [name] wrote: Reason for CRM: Patient is calling in to state to their provider that she will not be taking any inhalers that are not albuterol . Patient states this is true for the nebulizer as well. Patient states they all give her problems and she does not want them now or in the future.   Patient had additional inquiries about referrals and insurance, PAS answered.   ATC x1. LMTCB

## 2024-06-08 ENCOUNTER — Other Ambulatory Visit: Payer: Self-pay | Admitting: Emergency Medicine

## 2024-06-08 DIAGNOSIS — J449 Chronic obstructive pulmonary disease, unspecified: Secondary | ICD-10-CM

## 2024-06-08 NOTE — Telephone Encounter (Signed)
 Pt requesting refill of specialty medication - routing to Rx team to advise.

## 2024-06-11 ENCOUNTER — Ambulatory Visit (INDEPENDENT_AMBULATORY_CARE_PROVIDER_SITE_OTHER): Admitting: Physician Assistant

## 2024-06-11 ENCOUNTER — Encounter (INDEPENDENT_AMBULATORY_CARE_PROVIDER_SITE_OTHER): Payer: Self-pay | Admitting: Physician Assistant

## 2024-06-11 VITALS — BP 116/67 | HR 74 | Ht 65.5 in | Wt 136.0 lb

## 2024-06-11 DIAGNOSIS — J310 Chronic rhinitis: Secondary | ICD-10-CM | POA: Diagnosis not present

## 2024-06-11 DIAGNOSIS — J3489 Other specified disorders of nose and nasal sinuses: Secondary | ICD-10-CM

## 2024-06-11 DIAGNOSIS — H7293 Unspecified perforation of tympanic membrane, bilateral: Secondary | ICD-10-CM | POA: Diagnosis not present

## 2024-06-11 DIAGNOSIS — H9193 Unspecified hearing loss, bilateral: Secondary | ICD-10-CM | POA: Diagnosis not present

## 2024-06-11 DIAGNOSIS — R1310 Dysphagia, unspecified: Secondary | ICD-10-CM

## 2024-06-11 DIAGNOSIS — R221 Localized swelling, mass and lump, neck: Secondary | ICD-10-CM

## 2024-06-11 NOTE — Progress Notes (Unsigned)
 Previosu swallowing issues, was suppose dhave have esoph dilaiton

## 2024-06-15 ENCOUNTER — Telehealth: Payer: Self-pay

## 2024-06-15 NOTE — Telephone Encounter (Unsigned)
 Copied from CRM [id number]. Topic: Clinical - Prescription Issue >> Jun 15, 2024  4:02 PM [name] wrote: Reason for CRM: Patient states she told Dr. Shelah shes allergic to steroids - patient would like to confirm if her nebulizer solution is a steroid.   Ensifentrine  (OHTUVAYRE ) 3 MG/2.5ML SUSP [[id number]]    Callback number: [phone]

## 2024-06-16 NOTE — Telephone Encounter (Signed)
 Attempted to call patient - reached caregiver. Advised Ohtuvayre  is not a steroid. He verbalizes understanding and states she will be happy to hear this. Patient asleep at time of my call. NFN

## 2024-06-28 ENCOUNTER — Ambulatory Visit: Payer: Self-pay | Admitting: Emergency Medicine

## 2024-06-28 NOTE — Telephone Encounter (Signed)
 "   Reason for Triage: Patient experiencing weakness, terrible diarrhea, and stomach pain since taking OHTUVAYRE  and promethazine -dextromethorphan. Warm transfer to [facility]. Reason for Disposition  [1] Drinking very little AND [2] dehydration suspected (e.g., no urine > 12 hours, very dry mouth, very lightheaded)  Answer Assessment - Initial Assessment Questions Pt states she has been taking the promethazine  cough medicine sometimes at night, not every night and does the nebulizer once in the morning. She states that her stomach has been hurting the last couple of days. She states that it felt like something was moving around in there and then settled in her right side and has now been having diarrhea at least 5 times today. States she also hasn't been sleeping well and she feels really weak. She states she uses a walker usually but is struggling with that. She states that she just feels really weak. RN recommended ER due to weakness. [name] friend in the background advising her to go. She told RN multiple times she will not be going and asked for the doctor to call her back on her friends phone [phone]. RN advised the clinic is closed today and due to weakness recommendation is for ER. Rn offered to call 911 (as one of her reasons was she's staying in a hotel and the parking lot hasn't been cleared). She declined and again asked if she'd hear from MD today. Rn began to state no when pt's friend in background stated she just told you the office is closed and she thinks you need to go to the ER.SABRA Pt did say she thinks she's dehydrated and Rn again advised this is the reason RN believes ER is best place for patient. She again declined.  Due to ER dispo, not all triage questions answered.     1. DIARRHEA SEVERITY: How bad is the diarrhea? How many more stools have you had in the past 24 hours than normal?      About 5 today 2. ONSET: When did the diarrhea begin?      today 3. STOOL DESCRIPTION:   How loose or watery is the diarrhea? What is the stool color? Is there any blood or mucous in the stool?     Brown and mushy 4. VOMITING: Are you also vomiting? If Yes, ask: How many times in the past 24 hours?      denies 5. ABDOMEN PAIN: Are you having any abdomen pain? If Yes, ask: What does it feel like? (e.g., crampy, dull, intermittent, constant)      Yes, moving around abdomen 6. ABDOMEN PAIN SEVERITY: If present, ask: How bad is the pain?  (e.g., Scale 1-10; mild, moderate, or severe)      7. ORAL INTAKE: If vomiting, Have you been able to drink liquids? How much liquids have you had in the past 24 hours?     States drinking lots of water but still feels dehydrated 8. HYDRATION: Any signs of dehydration? (e.g., dry mouth [not just dry lips], too weak to stand, dizziness, new weight loss) When did you last urinate?     weakness 9. EXPOSURE: Have you traveled to a foreign country recently? Have you been exposed to anyone with diarrhea? Could you have eaten any food that was spoiled?      10. ANTIBIOTIC USE: Are you taking antibiotics now or have you taken antibiotics in the past 2 months?       November  11. OTHER SYMPTOMS: Do you have any other symptoms? (e.g., fever,  blood in stool)  Protocols used: Diarrhea-A-AH  "

## 2024-06-28 NOTE — Telephone Encounter (Signed)
 FYI Only or Action Required?: FYI only for provider: ED advised.  Patient is followed in Pulmonology for small cell carcinoma, last seen on 05/14/2024 by Shelah Lamar RAMAN, MD.  Called Nurse Triage reporting Diarrhea.  Symptoms began yesterday.  Interventions attempted: Maintenance inhaler and Nebulizer treatments.  Symptoms are: gradually worsening.  Triage Disposition: Go to ED Now (or PCP Triage)  Patient/caregiver understands and will follow disposition?: No, refuses disposition

## 2024-07-02 NOTE — Telephone Encounter (Signed)
 Please see message below  Reason for Triage: Patient experiencing weakness, terrible diarrhea, and stomach pain since taking OHTUVAYRE  and promethazine -dextromethorphan.

## 2024-07-04 NOTE — Telephone Encounter (Signed)
 Chart reviewed.  She did not go to the emergency department.  I did not realize initially that she was on Ohtuvayre , but certainly this medication can cause GI symptoms.  I spoke with her friend Debby this morning because she was not available.  I instructed her to stop the medication so we can follow to see how symptoms improve.

## 2024-07-16 ENCOUNTER — Ambulatory Visit (INDEPENDENT_AMBULATORY_CARE_PROVIDER_SITE_OTHER): Admitting: Audiology

## 2024-08-12 ENCOUNTER — Ambulatory Visit: Admitting: Adult Health

## 2024-09-13 ENCOUNTER — Inpatient Hospital Stay

## 2024-09-23 ENCOUNTER — Inpatient Hospital Stay: Admitting: Internal Medicine
